# Patient Record
Sex: Female | Born: 1975
Health system: Southern US, Community
[De-identification: ages and names within clinical notes are randomized; demographics above are authoritative.]

## PROBLEM LIST (undated history)

## (undated) DIAGNOSIS — F329 Major depressive disorder, single episode, unspecified: Secondary | ICD-10-CM

## (undated) DIAGNOSIS — G43909 Migraine, unspecified, not intractable, without status migrainosus: Secondary | ICD-10-CM

## (undated) DIAGNOSIS — D649 Anemia, unspecified: Secondary | ICD-10-CM

## (undated) DIAGNOSIS — G4733 Obstructive sleep apnea (adult) (pediatric): Secondary | ICD-10-CM

## (undated) DIAGNOSIS — I5043 Acute on chronic combined systolic (congestive) and diastolic (congestive) heart failure: Secondary | ICD-10-CM

## (undated) DIAGNOSIS — I4891 Unspecified atrial fibrillation: Secondary | ICD-10-CM

## (undated) DIAGNOSIS — R87619 Unspecified abnormal cytological findings in specimens from cervix uteri: Secondary | ICD-10-CM

## (undated) DIAGNOSIS — E282 Polycystic ovarian syndrome: Secondary | ICD-10-CM

## (undated) DIAGNOSIS — I5020 Unspecified systolic (congestive) heart failure: Secondary | ICD-10-CM

## (undated) DIAGNOSIS — IMO0002 Reserved for concepts with insufficient information to code with codable children: Secondary | ICD-10-CM

## (undated) DIAGNOSIS — I509 Heart failure, unspecified: Secondary | ICD-10-CM

## (undated) DIAGNOSIS — I10 Essential (primary) hypertension: Secondary | ICD-10-CM

## (undated) HISTORY — DX: Major depressive disorder, single episode, unspecified: F32.9

## (undated) HISTORY — DX: Heart failure, unspecified: I50.9

## (undated) HISTORY — PX: CERVIX LESION DESTRUCTION: SHX591

---
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
## 1997-11-10 ENCOUNTER — Other Ambulatory Visit: Admission: RE | Admit: 1997-11-10 | Discharge: 1997-11-10 | Payer: Self-pay | Admitting: Family Medicine

## 1998-01-04 ENCOUNTER — Emergency Department (HOSPITAL_COMMUNITY): Admission: EM | Admit: 1998-01-04 | Discharge: 1998-01-04 | Payer: Self-pay | Admitting: Emergency Medicine

## 1998-04-21 ENCOUNTER — Emergency Department (HOSPITAL_COMMUNITY): Admission: EM | Admit: 1998-04-21 | Discharge: 1998-04-21 | Payer: Self-pay | Admitting: Emergency Medicine

## 1998-08-20 ENCOUNTER — Inpatient Hospital Stay (HOSPITAL_COMMUNITY): Admission: AD | Admit: 1998-08-20 | Discharge: 1998-08-20 | Payer: Self-pay | Admitting: Obstetrics & Gynecology

## 1998-08-21 ENCOUNTER — Encounter: Payer: Self-pay | Admitting: Obstetrics & Gynecology

## 1998-08-22 ENCOUNTER — Inpatient Hospital Stay (HOSPITAL_COMMUNITY): Admission: AD | Admit: 1998-08-22 | Discharge: 1998-08-22 | Payer: Self-pay | Admitting: *Deleted

## 1998-10-09 ENCOUNTER — Inpatient Hospital Stay (HOSPITAL_COMMUNITY): Admission: AD | Admit: 1998-10-09 | Discharge: 1998-10-09 | Payer: Self-pay | Admitting: *Deleted

## 1998-11-06 ENCOUNTER — Ambulatory Visit (HOSPITAL_COMMUNITY): Admission: RE | Admit: 1998-11-06 | Discharge: 1998-11-06 | Payer: Self-pay | Admitting: Obstetrics & Gynecology

## 1998-12-27 ENCOUNTER — Inpatient Hospital Stay (HOSPITAL_COMMUNITY): Admission: AD | Admit: 1998-12-27 | Discharge: 1998-12-27 | Payer: Self-pay | Admitting: Obstetrics & Gynecology

## 1999-02-18 ENCOUNTER — Ambulatory Visit (HOSPITAL_COMMUNITY): Admission: RE | Admit: 1999-02-18 | Discharge: 1999-02-18 | Payer: Self-pay | Admitting: *Deleted

## 1999-03-06 ENCOUNTER — Inpatient Hospital Stay (HOSPITAL_COMMUNITY): Admission: AD | Admit: 1999-03-06 | Discharge: 1999-03-06 | Payer: Self-pay | Admitting: Obstetrics

## 1999-03-16 ENCOUNTER — Inpatient Hospital Stay (HOSPITAL_COMMUNITY): Admission: AD | Admit: 1999-03-16 | Discharge: 1999-03-17 | Payer: Self-pay | Admitting: Obstetrics

## 1999-03-21 ENCOUNTER — Inpatient Hospital Stay (HOSPITAL_COMMUNITY): Admission: AD | Admit: 1999-03-21 | Discharge: 1999-03-21 | Payer: Self-pay | Admitting: Obstetrics

## 1999-03-23 ENCOUNTER — Inpatient Hospital Stay (HOSPITAL_COMMUNITY): Admission: AD | Admit: 1999-03-23 | Discharge: 1999-03-23 | Payer: Self-pay | Admitting: *Deleted

## 1999-03-24 ENCOUNTER — Inpatient Hospital Stay (HOSPITAL_COMMUNITY): Admission: AD | Admit: 1999-03-24 | Discharge: 1999-03-26 | Payer: Self-pay | Admitting: Obstetrics & Gynecology

## 1999-08-23 ENCOUNTER — Inpatient Hospital Stay (HOSPITAL_COMMUNITY): Admission: AD | Admit: 1999-08-23 | Discharge: 1999-08-23 | Payer: Self-pay | Admitting: *Deleted

## 1999-09-03 ENCOUNTER — Inpatient Hospital Stay (HOSPITAL_COMMUNITY): Admission: AD | Admit: 1999-09-03 | Discharge: 1999-09-03 | Payer: Self-pay | Admitting: Obstetrics

## 1999-12-09 ENCOUNTER — Emergency Department (HOSPITAL_COMMUNITY): Admission: EM | Admit: 1999-12-09 | Discharge: 1999-12-10 | Payer: Self-pay | Admitting: Emergency Medicine

## 2000-05-10 ENCOUNTER — Emergency Department (HOSPITAL_COMMUNITY): Admission: EM | Admit: 2000-05-10 | Discharge: 2000-05-10 | Payer: Self-pay | Admitting: Emergency Medicine

## 2000-05-21 ENCOUNTER — Emergency Department (HOSPITAL_COMMUNITY): Admission: EM | Admit: 2000-05-21 | Discharge: 2000-05-21 | Payer: Self-pay | Admitting: Emergency Medicine

## 2000-06-06 ENCOUNTER — Inpatient Hospital Stay (HOSPITAL_COMMUNITY): Admission: AD | Admit: 2000-06-06 | Discharge: 2000-06-06 | Payer: Self-pay | Admitting: *Deleted

## 2000-07-21 ENCOUNTER — Inpatient Hospital Stay (HOSPITAL_COMMUNITY): Admission: AD | Admit: 2000-07-21 | Discharge: 2000-07-21 | Payer: Self-pay | Admitting: Obstetrics & Gynecology

## 2000-10-30 ENCOUNTER — Inpatient Hospital Stay (HOSPITAL_COMMUNITY): Admission: AD | Admit: 2000-10-30 | Discharge: 2000-10-30 | Payer: Self-pay | Admitting: Obstetrics & Gynecology

## 2001-04-22 ENCOUNTER — Encounter: Payer: Self-pay | Admitting: Emergency Medicine

## 2001-04-22 ENCOUNTER — Emergency Department (HOSPITAL_COMMUNITY): Admission: EM | Admit: 2001-04-22 | Discharge: 2001-04-22 | Payer: Self-pay | Admitting: Emergency Medicine

## 2001-07-27 ENCOUNTER — Emergency Department (HOSPITAL_COMMUNITY): Admission: EM | Admit: 2001-07-27 | Discharge: 2001-07-27 | Payer: Self-pay | Admitting: Emergency Medicine

## 2001-08-26 ENCOUNTER — Inpatient Hospital Stay (HOSPITAL_COMMUNITY): Admission: AD | Admit: 2001-08-26 | Discharge: 2001-08-26 | Payer: Self-pay | Admitting: *Deleted

## 2001-09-13 ENCOUNTER — Emergency Department (HOSPITAL_COMMUNITY): Admission: EM | Admit: 2001-09-13 | Discharge: 2001-09-13 | Payer: Self-pay

## 2001-12-18 ENCOUNTER — Emergency Department (HOSPITAL_COMMUNITY): Admission: EM | Admit: 2001-12-18 | Discharge: 2001-12-19 | Payer: Self-pay | Admitting: Emergency Medicine

## 2002-02-09 ENCOUNTER — Inpatient Hospital Stay (HOSPITAL_COMMUNITY): Admission: AD | Admit: 2002-02-09 | Discharge: 2002-02-09 | Payer: Self-pay | Admitting: *Deleted

## 2002-06-06 ENCOUNTER — Inpatient Hospital Stay (HOSPITAL_COMMUNITY): Admission: AD | Admit: 2002-06-06 | Discharge: 2002-06-06 | Payer: Self-pay | Admitting: *Deleted

## 2002-08-21 ENCOUNTER — Inpatient Hospital Stay (HOSPITAL_COMMUNITY): Admission: AD | Admit: 2002-08-21 | Discharge: 2002-08-21 | Payer: Self-pay | Admitting: Obstetrics and Gynecology

## 2002-12-20 ENCOUNTER — Emergency Department (HOSPITAL_COMMUNITY): Admission: EM | Admit: 2002-12-20 | Discharge: 2002-12-21 | Payer: Self-pay | Admitting: Emergency Medicine

## 2003-03-22 ENCOUNTER — Emergency Department (HOSPITAL_COMMUNITY): Admission: EM | Admit: 2003-03-22 | Discharge: 2003-03-22 | Payer: Self-pay | Admitting: Emergency Medicine

## 2003-05-15 ENCOUNTER — Emergency Department (HOSPITAL_COMMUNITY): Admission: EM | Admit: 2003-05-15 | Discharge: 2003-05-16 | Payer: Self-pay | Admitting: Emergency Medicine

## 2003-08-06 ENCOUNTER — Inpatient Hospital Stay (HOSPITAL_COMMUNITY): Admission: AD | Admit: 2003-08-06 | Discharge: 2003-08-06 | Payer: Self-pay | Admitting: Obstetrics and Gynecology

## 2004-08-16 ENCOUNTER — Inpatient Hospital Stay (HOSPITAL_COMMUNITY): Admission: AD | Admit: 2004-08-16 | Discharge: 2004-08-16 | Payer: Self-pay | Admitting: Obstetrics & Gynecology

## 2004-10-21 ENCOUNTER — Emergency Department (HOSPITAL_COMMUNITY): Admission: EM | Admit: 2004-10-21 | Discharge: 2004-10-21 | Payer: Self-pay | Admitting: Emergency Medicine

## 2004-11-18 ENCOUNTER — Emergency Department (HOSPITAL_COMMUNITY): Admission: EM | Admit: 2004-11-18 | Discharge: 2004-11-18 | Payer: Self-pay | Admitting: Family Medicine

## 2004-12-29 ENCOUNTER — Inpatient Hospital Stay (HOSPITAL_COMMUNITY): Admission: AD | Admit: 2004-12-29 | Discharge: 2004-12-29 | Payer: Self-pay | Admitting: *Deleted

## 2005-02-01 ENCOUNTER — Emergency Department (HOSPITAL_COMMUNITY): Admission: EM | Admit: 2005-02-01 | Discharge: 2005-02-01 | Payer: Self-pay | Admitting: Emergency Medicine

## 2005-02-06 ENCOUNTER — Emergency Department (HOSPITAL_COMMUNITY): Admission: EM | Admit: 2005-02-06 | Discharge: 2005-02-06 | Payer: Self-pay | Admitting: *Deleted

## 2005-08-01 ENCOUNTER — Emergency Department (HOSPITAL_COMMUNITY): Admission: EM | Admit: 2005-08-01 | Discharge: 2005-08-01 | Payer: Self-pay | Admitting: Emergency Medicine

## 2006-06-02 ENCOUNTER — Emergency Department (HOSPITAL_COMMUNITY): Admission: EM | Admit: 2006-06-02 | Discharge: 2006-06-02 | Payer: Self-pay | Admitting: Family Medicine

## 2006-06-20 ENCOUNTER — Emergency Department (HOSPITAL_COMMUNITY): Admission: EM | Admit: 2006-06-20 | Discharge: 2006-06-20 | Payer: Self-pay | Admitting: Emergency Medicine

## 2006-10-19 ENCOUNTER — Emergency Department (HOSPITAL_COMMUNITY): Admission: EM | Admit: 2006-10-19 | Discharge: 2006-10-19 | Payer: Self-pay | Admitting: Family Medicine

## 2007-03-30 ENCOUNTER — Inpatient Hospital Stay (HOSPITAL_COMMUNITY): Admission: AD | Admit: 2007-03-30 | Discharge: 2007-03-31 | Payer: Self-pay | Admitting: Obstetrics and Gynecology

## 2007-04-09 ENCOUNTER — Other Ambulatory Visit: Payer: Self-pay | Admitting: Emergency Medicine

## 2007-04-09 ENCOUNTER — Emergency Department (HOSPITAL_COMMUNITY): Admission: EM | Admit: 2007-04-09 | Discharge: 2007-04-09 | Payer: Self-pay | Admitting: Emergency Medicine

## 2007-05-03 ENCOUNTER — Ambulatory Visit: Payer: Self-pay | Admitting: Obstetrics and Gynecology

## 2007-06-14 ENCOUNTER — Inpatient Hospital Stay (HOSPITAL_COMMUNITY): Admission: AD | Admit: 2007-06-14 | Discharge: 2007-06-14 | Payer: Self-pay | Admitting: Obstetrics and Gynecology

## 2008-01-16 ENCOUNTER — Inpatient Hospital Stay (HOSPITAL_COMMUNITY): Admission: AD | Admit: 2008-01-16 | Discharge: 2008-01-17 | Payer: Self-pay | Admitting: Obstetrics and Gynecology

## 2008-01-21 ENCOUNTER — Inpatient Hospital Stay (HOSPITAL_COMMUNITY): Admission: AD | Admit: 2008-01-21 | Discharge: 2008-01-22 | Payer: Self-pay | Admitting: Obstetrics & Gynecology

## 2008-02-13 ENCOUNTER — Ambulatory Visit: Payer: Self-pay | Admitting: Obstetrics and Gynecology

## 2008-05-28 ENCOUNTER — Encounter: Payer: Self-pay | Admitting: Obstetrics and Gynecology

## 2008-05-28 ENCOUNTER — Ambulatory Visit: Payer: Self-pay | Admitting: Obstetrics and Gynecology

## 2008-08-06 ENCOUNTER — Ambulatory Visit: Payer: Self-pay | Admitting: Obstetrics & Gynecology

## 2008-08-30 ENCOUNTER — Inpatient Hospital Stay (HOSPITAL_COMMUNITY): Admission: AD | Admit: 2008-08-30 | Discharge: 2008-08-30 | Payer: Self-pay | Admitting: Obstetrics & Gynecology

## 2008-09-25 ENCOUNTER — Ambulatory Visit: Payer: Self-pay | Admitting: Obstetrics and Gynecology

## 2008-09-25 ENCOUNTER — Other Ambulatory Visit: Admission: RE | Admit: 2008-09-25 | Discharge: 2008-09-25 | Payer: Self-pay | Admitting: Family Medicine

## 2008-11-05 ENCOUNTER — Ambulatory Visit: Payer: Self-pay | Admitting: Obstetrics and Gynecology

## 2009-06-02 ENCOUNTER — Inpatient Hospital Stay (HOSPITAL_COMMUNITY): Admission: AD | Admit: 2009-06-02 | Discharge: 2009-06-02 | Payer: Self-pay | Admitting: Obstetrics & Gynecology

## 2009-08-04 ENCOUNTER — Emergency Department (HOSPITAL_COMMUNITY): Admission: EM | Admit: 2009-08-04 | Discharge: 2009-08-04 | Payer: Self-pay | Admitting: Emergency Medicine

## 2009-08-21 ENCOUNTER — Observation Stay (HOSPITAL_COMMUNITY): Admission: AD | Admit: 2009-08-21 | Discharge: 2009-08-22 | Payer: Self-pay | Admitting: Obstetrics & Gynecology

## 2009-08-21 ENCOUNTER — Ambulatory Visit: Payer: Self-pay | Admitting: Obstetrics & Gynecology

## 2009-08-21 DIAGNOSIS — N7011 Chronic salpingitis: Secondary | ICD-10-CM | POA: Insufficient documentation

## 2009-08-21 HISTORY — DX: Chronic salpingitis: N70.11

## 2009-09-18 ENCOUNTER — Encounter: Payer: Self-pay | Admitting: Obstetrics & Gynecology

## 2009-09-18 ENCOUNTER — Ambulatory Visit: Payer: Self-pay | Admitting: Obstetrics and Gynecology

## 2009-10-16 ENCOUNTER — Ambulatory Visit: Payer: Self-pay | Admitting: Obstetrics & Gynecology

## 2009-11-06 ENCOUNTER — Ambulatory Visit: Payer: Self-pay | Admitting: Obstetrics & Gynecology

## 2009-12-01 ENCOUNTER — Inpatient Hospital Stay (HOSPITAL_COMMUNITY): Admission: AD | Admit: 2009-12-01 | Discharge: 2009-12-01 | Payer: Self-pay | Admitting: Obstetrics & Gynecology

## 2009-12-04 ENCOUNTER — Ambulatory Visit: Payer: Self-pay | Admitting: Obstetrics and Gynecology

## 2010-10-09 LAB — POCT I-STAT, CHEM 8
Glucose, Bld: 124 mg/dL — ABNORMAL HIGH (ref 70–99)
HCT: 31 % — ABNORMAL LOW (ref 36.0–46.0)
Hemoglobin: 10.5 g/dL — ABNORMAL LOW (ref 12.0–15.0)
Potassium: 4 mEq/L (ref 3.5–5.1)
TCO2: 27 mmol/L (ref 0–100)

## 2010-10-09 LAB — DIFFERENTIAL
Basophils Relative: 1 % (ref 0–1)
Eosinophils Absolute: 0.1 10*3/uL (ref 0.0–0.7)
Eosinophils Relative: 1 % (ref 0–5)
Monocytes Relative: 6 % (ref 3–12)
Neutrophils Relative %: 62 % (ref 43–77)

## 2010-10-09 LAB — WET PREP, GENITAL: Yeast Wet Prep HPF POC: NONE SEEN

## 2010-10-09 LAB — CBC
MCHC: 31.4 g/dL (ref 30.0–36.0)
MCV: 74 fL — ABNORMAL LOW (ref 78.0–100.0)
Platelets: 345 10*3/uL (ref 150–400)
RBC: 4.11 MIL/uL (ref 3.87–5.11)

## 2010-10-09 LAB — PREGNANCY, URINE: Preg Test, Ur: NEGATIVE

## 2010-10-11 LAB — CBC
Hemoglobin: 6.5 g/dL — CL (ref 12.0–15.0)
Hemoglobin: 8.4 g/dL — ABNORMAL LOW (ref 12.0–15.0)
MCHC: 32.8 g/dL (ref 30.0–36.0)
MCV: 76.9 fL — ABNORMAL LOW (ref 78.0–100.0)
Platelets: 460 10*3/uL — ABNORMAL HIGH (ref 150–400)
RBC: 3.33 MIL/uL — ABNORMAL LOW (ref 3.87–5.11)
RDW: 17.7 % — ABNORMAL HIGH (ref 11.5–15.5)
RDW: 19.3 % — ABNORMAL HIGH (ref 11.5–15.5)

## 2010-10-11 LAB — URINALYSIS, ROUTINE W REFLEX MICROSCOPIC
Leukocytes, UA: NEGATIVE
Nitrite: NEGATIVE
Protein, ur: NEGATIVE mg/dL
Specific Gravity, Urine: >1.03 — ABNORMAL HIGH (ref 1.005–1.030)
Urobilinogen, UA: 0.2 mg/dL (ref 0.0–1.0)

## 2010-10-11 LAB — COMPREHENSIVE METABOLIC PANEL
ALT: 8 U/L (ref 0–35)
AST: 15 U/L (ref 0–37)
Albumin: 3.3 g/dL — ABNORMAL LOW (ref 3.5–5.2)
CO2: 26 mEq/L (ref 19–32)
Chloride: 105 mEq/L (ref 96–112)
GFR calc Af Amer: >60 mL/min (ref >60)
GFR calc non Af Amer: >60 mL/min (ref >60)
Sodium: 137 mEq/L (ref 135–145)
Total Bilirubin: 0.6 mg/dL (ref 0.3–1.2)

## 2010-10-11 LAB — URINE MICROSCOPIC-ADD ON

## 2010-10-11 LAB — CROSSMATCH: Antibody Screen: NEGATIVE

## 2010-10-12 LAB — CBC
MCHC: 31.8 g/dL (ref 30.0–36.0)
MCV: 68.5 fL — ABNORMAL LOW (ref 78.0–100.0)
Platelets: 373 10*3/uL (ref 150–400)
RBC: 4.5 MIL/uL (ref 3.87–5.11)

## 2010-10-12 LAB — SAMPLE TO BLOOD BANK

## 2010-10-15 LAB — POCT PREGNANCY, URINE: Preg Test, Ur: NEGATIVE

## 2010-11-09 LAB — CBC
HCT: 31.5 % — ABNORMAL LOW (ref 36.0–46.0)
Hemoglobin: 10 g/dL — ABNORMAL LOW (ref 12.0–15.0)
MCHC: 31.7 g/dL (ref 30.0–36.0)
MCV: 71.9 fL — ABNORMAL LOW (ref 78.0–100.0)
RBC: 4.38 MIL/uL (ref 3.87–5.11)
WBC: 8.8 10*3/uL (ref 4.0–10.5)

## 2010-11-09 LAB — WET PREP, GENITAL: Yeast Wet Prep HPF POC: NONE SEEN

## 2010-11-09 LAB — GC/CHLAMYDIA PROBE AMP, GENITAL: Chlamydia, DNA Probe: NEGATIVE

## 2010-11-12 ENCOUNTER — Ambulatory Visit (INDEPENDENT_AMBULATORY_CARE_PROVIDER_SITE_OTHER): Payer: Medicaid Other | Admitting: Advanced Practice Midwife

## 2010-11-12 DIAGNOSIS — N920 Excessive and frequent menstruation with regular cycle: Secondary | ICD-10-CM

## 2010-11-12 DIAGNOSIS — E282 Polycystic ovarian syndrome: Secondary | ICD-10-CM

## 2010-11-13 NOTE — Group Therapy Note (Unsigned)
NAMEMarland Kitchen  [name], [name] NO.:  [id number]  MEDICAL RECORD NO.:  [id number]           PATIENT TYPE:  A  [facility]ATION:  [facility]                   FACILITY:  [facility]  PHYSICIAN:  [name], CNM    DATE OF BIRTH:  [birth year]  DATE OF SERVICE:  11/12/2010                                 CLINIC NOTE  This is a 35 year old gravida 2, para 2 who is not pregnant who presents with report of continued menorrhagia and is requesting to be restarted on oral contraceptives.  She has seen Dr. Jolayne [name], Dr. Marice [name] and Dr. Okey [name] for several years here in the clinic for the same dysfunctional uterine bleeding associated with menorrhagia and PCOS.  She has been trying to conceive in recent years and has been through several cycles of Clomid and also had an IUD placed in Tammy 2011 which was expelled a month later.  She has used birth control pills intermittently along with Provera and Clomid.  She says that she has been counseled on endometrial ablation but does not want to proceed with that at this time and wants to simply go back on oral contraceptives as she had the best control with them.  She is off of pills.  She has about 3-4 periods a year and on pills she has about 6 periods a year, although she states she is not worried about conceiving anymore.  She does want to get the phone number for Dr. April [name] which she lost after her last visit.  She is having some heavy bleeding today and declines Pap smear and pelvic exam, but she will need a Pap smear on her next visit if the bleeding is subsided. She is also requesting a prescription for some ibuprofen for her dysmenorrhea.  Blood pressure is noted to be 136/93 today, weight is 210.  She has had multiple instances of hypertension over the past previous visits in the 120-140s range with diastolics in the 90s.  She has never had her blood pressure evaluated or treated.  She states that she has a child who is sick and getting ready to  go to [facility] for Crohn disease and other medical problems and that she does not have time to go to a primary doctor.  She is interested in starting some medication for her blood pressure.  ASSESSMENT AND PLAN: 1. Polycystic ovarian syndrome. 2. Dysfunctional uterine bleeding/menorrhagia. 3. Anemia. 4. Obesity.  PLAN: 1. Prescription was given for Ovcon 35 one p.o. daily with 5 refills. 2. Discussed the patient with Dr. Shawnie [name] who agrees with plans for OCPs     and also consents to start the patient on HCTZ 12.5 mg p.o. daily     as initial therapy for mild hypertension.  We will recheck her     blood pressure in a month and evaluate effectiveness of her     medication at that time.  We may need to go up to 25 mg of HCTZ.     Of note, she does report being a nonsmoker. 3. Prescription given for ibuprofen 600 mg p.o. q.6 h. p.r.n. pain. 4. Return in 4 weeks for blood pressure  check, evaluation of bleeding     and Pap smear at that time.  Her last Pap was noted to be in 2009.          ______________________________ [name], CNM    MW/MEDQ  D:  11/12/2010  T:  11/13/2010  Job:  [id number]

## 2010-12-07 NOTE — Group Therapy Note (Signed)
NAME:  [name], [name] NO.:  [id number]   MEDICAL RECORD NO.:  [id number]          PATIENT TYPE:  WOC   LOCATION:  [facility]                   FACILITY:  [facility]   PHYSICIAN:  Argentina Donovan, MD        DATE OF BIRTH:  1975-12-17   DATE OF SERVICE:  02/13/2008                                  CLINIC NOTE   The patient is a 35 year old gravida 2, para 2-0-0-2 with children ages  is 7 and 50 who developed irregular periods 4 years ago.  Sometimes goes  3-4 months between periods.  She has also developed hirsutism.  Her  ultrasound was normal but she has a high free testosterone of 15.8 and  has been considered a polycystic ovarian syndrome patient.  She was  given instructions to use Provera.  She did not have a period  periodically some time ago.  Unfortunately, she ran out of that, was in  the emergency room recently after a 3-month amenorrheic phase and she  was having extraordinarily heavy bleeding with a hemoglobin of 9.6.  They put her on Provera and that stopped the bleeding.  She ran out of  her last pill yesterday, began bleeding today.  I told her this was  normal.  She desires pregnancy.  We told her we could control her  bleeding with several methods and she did not want to not try and get  pregnant.  On the other hand, she has a little difficult time paying for  Clomid at this point.  I have educated her about the use of a basal  temperature thermometer and basal temperature chart.  We have talked  about what she can do.  I am going to put her on 500 mg of Glucophage  b.i.d. with meals.  I instructed not to take it if she does not have a  meal, and Provera to take every 45 days if she does not have a period.  I am going to reevaluate her in 3 months.  If she is ovulating, will  continue the therapy.  If she is not, will start her on Clomid in  addition to this and if she ovulates regularly and is not pregnant at  that time, will get a sperm count because her  partner has no children.  At this point she weighs 208 pounds and is 4 feet 9 inches tall.  A  lovely young lady and I think we can help her.  She is intelligent and  she seems to understand what we are discussing.   IMPRESSION:  Polycystic ovarian syndrome with recent dysfunctional  uterine bleeding and anemia.   PLAN:  Treat to promote ovulation.           ______________________________  Argentina Donovan, MD     PR/MEDQ  D:  02/13/2008  T:  02/13/2008  Job:  [id number]

## 2010-12-07 NOTE — Group Therapy Note (Signed)
NAME:  [name], [name] NO.:  [id number]   MEDICAL RECORD NO.:  [id number]          PATIENT TYPE:  WOC   [facility]ATION[facility]                   [facility]ACILITY[facility]  [facility]   PHYSICIAN:  Argentina Donovan, MD        DATE OF BIRTH:  [birth year]   DATE OF SERVICE:  11/05/2008                                  CLINIC NOTE   The patient is a 35 year old African American female with known  polycystic ovarian syndrome.  We started her on Clomid.  She did a  temperature chart last month; however, was unable to put it on a chart,  but she brought in all her temperatures and she went above 98 on only 2  days of that time.  She has not had a period since that time since  starting the Provera to bring it on that we started and she has stopped  the Glucophage because of chronic nausea.  We have given her Provera  again to bring on an artificial period.  We will put her on 100 mg of  Clomid for 5 days with instructions if she has a period to come in to  drop off her temperature chart or if she does not have one 30 days after  taking the Clomid to drop off her temperature chart or to come in and we  will up the dose again to three a day.   IMPRESSION:  Amenorrhea with polycystic ovarian syndrome attempting  pregnancy.           ______________________________  Argentina Donovan, MD     PR/MEDQ  D:  11/05/2008  T:  11/05/2008  Job:  [id number]

## 2010-12-07 NOTE — Group Therapy Note (Signed)
NAME:  [name], [name] NO.:  [id number]   MEDICAL RECORD NO.:  [id number]          PATIENT TYPE:  WOC   [facility]ATION[facility]                   [facility]   PHYSICIAN:  Argentina Donovan, MD        DATE OF BIRTH:  Feb 22, 1976   DATE OF SERVICE:  09/25/2008                                  CLINIC NOTE   The patient is a 35 year old African American female known to [place] with  polycystic ovarian syndrome who was attempting to get pregnant and was  on previously on 500 of Glucophage b.i.d. Some time ago she stopped  taking her temperature and stopped the Glucophage because she was having  some irregular bleeding.  I had not seen her back.  She was supposed to  be also taking basal body temperatures but she ended up in the maternity  admissions with heavy vaginal bleeding a few days ago and her hemoglobin  had dropped significantly from the last time that she was in.  She had  not been on any iron replacement therapy.  At that time we started her  on that because of the heavy bleeding that she had been having although  today she was bleeding very little and because of the length of time  since she had been taking her Glucophage and thought that it would be  the best thing to do an endometrial biopsy which was done without  incident.  Her uterus sounded to 8-cm.  The patient is 4 foot, 9 and  weighs 205 pounds and her blood pressure is borderline at 139/98.  She  is also on some albuterol intermittently for asthma and other than that  there is not much change to her history.  She is promising me that this  time she will listen to instructions, although I have my doubts and we  will restart her on a temperature chart which is the only inexpensive  way of determining the possibility of whether or not she has ovulated.  I am going to give her start on some Provera for 10 days at 10 mg a day  and with withdrawals we will start her on Clomid 50 mg for 5 days on 5  through 9.  She  will come in if she has not had a period within 30 days  after the Clomid or if she does have a period she will come in within  the first week after the onset of the period.  At that time we will  determine whether or not we have to increase the Clomid.  She has not  been much in favor of taking the Glucophage or getting back on it,  although I think that with this particular patient it be of decided  benefit to help her control the bleeding and also I believe the Clomid  has a better record of getting her pregnant and keeping her pregnant.  There is a higher chance of miscarriage if she conceives on Clomid than  on Glucophage.  At previous visits her free testosterone was  significantly elevated to 15.8 and her sex hormone binding globulin  was  at 12, markedly hirsute, has oligomenorrhea with episodes of  dysfunctional uterine bleeding and has many small follicles within the  ovary seen, so meets all the criteria of polycystic ovarian syndrome.  She is restarting on the temperature chart.  She said her 71-year-old ate  the previous thermometer, so she has to go out and buy a new one.  I am  not sure on how much confidence we can in her following through, but we  will see and try and work with her.  I have stressed the fact that if  she is not attempting pregnancy that we should get her on oral  contraceptives, Depo-Provera or something to inhibit the estrogen  production           ______________________________  Argentina Donovan, MD     PR/MEDQ  D:  09/25/2008  T:  09/25/2008  Job:  [id number]

## 2010-12-07 NOTE — Group Therapy Note (Signed)
NAME:  NEOLA, WORRALL NO.:  [id number]   MEDICAL RECORD NO.:  [id number]          PATIENT TYPE:  WOC   LOCATION[facility]                   [facility]ILITY[facility]  [facility]   PHYSICIAN:  Allie Bossier, MD        DATE OF BIRTH:  03-28-1976   DATE OF SERVICE:  08/06/2008                                  CLINIC NOTE   Cari is a 35 year old African American G2 P2 who was seen here in  November 2009 by Dr. Okey Dupre regarding her desire to get pregnant.  Prior  to that visit she had been instructed to keep an ovulation chart and she  has been on Glucophage 500 mg b.i.d.  When she saw Dr. Okey Dupre on May 28, 2008 she did not bring her temperature chart.  She told Dr. Okey Dupre that  she would fax it to him and that he could judge.  However this was not  done.  In addition since then  she said she kept her temperature chart for the next month, but did not  bring the results with her today and she said the last 3 weeks she has  not taken her temperature at all.  She has had some occasion of spotting  and is wondering if this is a period.  She did say she bled for  approximately 2 days and had to use a panty liner.  She has not taken  the Provera anytime recently.  She has requested a pregnancy test.  I  explained that without any information to go on (there are no  temperature charts for me to look at) that I could not make an informed  decision on whether to add Clomid to her regimen.  I suggested that she  restart taking her temperature as well as stressed the importance that  it is important to bring the temperature charts to her visit should she  like a physician to make an opinion on the said temperature charts.  She  will follow up in 3 months or sooner as necessary.      Allie Bossier, MD     MCD/MEDQ  D:  08/06/2008  T:  08/06/2008  Job:  [id number]

## 2010-12-07 NOTE — Group Therapy Note (Signed)
NAME:  LASHAWNE, DURA NO.:  [id number]   MEDICAL RECORD NO.:  [id number]          PATIENT TYPE:  WOC   [facility]ATION[facility]                   [facility]ILITY[facility]   PHYSICIAN:  Argentina Donovan, MD        DATE OF BIRTH:  [birth year]   DATE OF SERVICE:                                  CLINIC NOTE   HISTORY:  The patient is a 35 year old African American female gravida  2, para 2-0-0-2 who had been trying to get pregnant.  Please see  previous note.  She has been on temperature chart and on Glucophage.  Has had some spontaneous period on her own since then, but she forgot to  bring the temperature charts today.  She is going to fax it over to me.  In addition to that, she needed a Pap smear done.  This was done without  incident.   The external genitalia is normal.  BUS is within normal limits.  Vagina  is clean and well rugated with redundant walls.  The cervix is clean.  The vagina is somewhat elongated, but the introitus is relatively small.  So a Peterson speculum was used.  The uterus and adnexa could not be  outlined.   The patient is told I would give her a call and decide whether or not  she needed Clomid as well as she is trying to conceive at this point.  We have tried to just get her ovulating on the Glucophage in view of the  expense of the Clomid.  Once I get the temperature chart, we will make a  decision and contact her by phone.           ______________________________  Argentina Donovan, MD     PR/MEDQ  D:  05/28/2008  T:  05/28/2008  Job:  [id number]

## 2010-12-07 NOTE — Group Therapy Note (Signed)
NAME:  [name], [name] NO.:  [id number]   MEDICAL RECORD NO.:  [id number]          PATIENT TYPE:  WOC   [facility]                   [facility]   PHYSICIAN:  Argentina Donovan, MD        DATE OF BIRTH:  07/24/1976   DATE OF SERVICE:  05/03/2007                                  CLINIC NOTE   The patient is a 36 year old gravida 2, para 2-0-0-2 with a child age 86  and 30 years old who developed irregular periods about 3 years ago and  sometimes goes 4 months between periods. Has also become quite hirsute  in that time and was seen a short-time ago because of very heavy vaginal  bleeding, was in here twice and in to the emergency room.  Her last  hemoglobin was 8.9 with a hematocrit of  27.5.  She was given iron  pills, but says she has not taken any. I have encouraged her to take  that. We also gave her lessons in how her cycle should work and I have  put her on Provera 10 mg b.i.d. if she goes more than 35 days without.  Also, since she has been trying for 2 years to get pregnant, I have  started her on some Clomid 100 mg for day 5-9 of each cycle when she  does have a period.   IMPRESSION:  Amenorrhea with dysfunctional uterine bleeding periodically  and significant anemia with polycystic ovarian syndrome.           ______________________________  Argentina Donovan, MD     PR/MEDQ  D:  05/03/2007  T:  05/03/2007  Job:  [id number]

## 2010-12-10 ENCOUNTER — Ambulatory Visit: Payer: Medicaid Other | Admitting: Obstetrics and Gynecology

## 2011-04-21 LAB — CBC
HCT: 31.1 — ABNORMAL LOW
Hemoglobin: 9.6 — ABNORMAL LOW
MCHC: 32.5
MCV: 75.5 — ABNORMAL LOW
MCV: 75.7 — ABNORMAL LOW
Platelets: 347
RBC: 3.9
RDW: 17.7 — ABNORMAL HIGH

## 2011-05-02 ENCOUNTER — Emergency Department (HOSPITAL_COMMUNITY)
Admission: EM | Admit: 2011-05-02 | Discharge: 2011-05-02 | Discharge status: Against Medical Advice | Payer: Medicaid Other | Source: Home / Self Care | Attending: Emergency Medicine | Admitting: Emergency Medicine

## 2011-05-03 ENCOUNTER — Encounter (HOSPITAL_COMMUNITY): Payer: Self-pay | Admitting: Radiology

## 2011-05-03 ENCOUNTER — Emergency Department (HOSPITAL_COMMUNITY): Payer: Medicaid Other

## 2011-05-03 ENCOUNTER — Encounter: Payer: Self-pay | Admitting: Family Medicine

## 2011-05-03 ENCOUNTER — Inpatient Hospital Stay (HOSPITAL_COMMUNITY)
Admission: EM | Admit: 2011-05-03 | Discharge: 2011-05-06 | DRG: 872 | Disposition: A | Discharge status: Home / Self Care | Payer: Medicaid Other | Attending: Family Medicine | Admitting: Family Medicine

## 2011-05-03 DIAGNOSIS — D509 Iron deficiency anemia, unspecified: Secondary | ICD-10-CM | POA: Diagnosis present

## 2011-05-03 DIAGNOSIS — R109 Unspecified abdominal pain: Secondary | ICD-10-CM

## 2011-05-03 DIAGNOSIS — J45909 Unspecified asthma, uncomplicated: Secondary | ICD-10-CM | POA: Diagnosis present

## 2011-05-03 DIAGNOSIS — N10 Acute tubulo-interstitial nephritis: Secondary | ICD-10-CM | POA: Diagnosis present

## 2011-05-03 DIAGNOSIS — I10 Essential (primary) hypertension: Secondary | ICD-10-CM | POA: Diagnosis present

## 2011-05-03 DIAGNOSIS — A419 Sepsis, unspecified organism: Principal | ICD-10-CM | POA: Diagnosis present

## 2011-05-03 DIAGNOSIS — G43909 Migraine, unspecified, not intractable, without status migrainosus: Secondary | ICD-10-CM | POA: Diagnosis present

## 2011-05-03 DIAGNOSIS — E282 Polycystic ovarian syndrome: Secondary | ICD-10-CM | POA: Diagnosis present

## 2011-05-03 DIAGNOSIS — E119 Type 2 diabetes mellitus without complications: Secondary | ICD-10-CM | POA: Diagnosis present

## 2011-05-03 HISTORY — DX: Migraine, unspecified, not intractable, without status migrainosus: G43.909

## 2011-05-03 HISTORY — DX: Essential (primary) hypertension: I10

## 2011-05-03 HISTORY — DX: Anemia, unspecified: D64.9

## 2011-05-03 HISTORY — DX: Polycystic ovarian syndrome: E28.2

## 2011-05-03 LAB — URINALYSIS, ROUTINE W REFLEX MICROSCOPIC
Bilirubin Urine: NEGATIVE
Glucose, UA: NEGATIVE mg/dL
Ketones, ur: NEGATIVE mg/dL
Nitrite: NEGATIVE
Protein, ur: 30 mg/dL — AB
Specific Gravity, Urine: 1.02
pH: 7

## 2011-05-03 LAB — POCT I-STAT, CHEM 8
Creatinine, Ser: 0.9 mg/dL (ref 0.50–1.10)
Hemoglobin: 12.6 g/dL (ref 12.0–15.0)
Sodium: 139 mEq/L (ref 135–145)
TCO2: 23 mmol/L (ref 0–100)

## 2011-05-03 LAB — WET PREP, GENITAL
Clue Cells Wet Prep HPF POC: NONE SEEN
Trich, Wet Prep: NONE SEEN

## 2011-05-03 LAB — CBC
Hemoglobin: 10.8 g/dL — ABNORMAL LOW (ref 12.0–15.0)
MCH: 22.7 pg — ABNORMAL LOW (ref 26.0–34.0)
MCHC: 32.1 g/dL (ref 30.0–36.0)
Platelets: 372
RDW: 16.6 — ABNORMAL HIGH
WBC: 12.5 — ABNORMAL HIGH

## 2011-05-03 LAB — URINE MICROSCOPIC-ADD ON

## 2011-05-03 LAB — DIFFERENTIAL
Basophils Relative: 0 % (ref 0–1)
Eosinophils Absolute: 0 10*3/uL (ref 0.0–0.7)
Monocytes Absolute: 0.9 10*3/uL (ref 0.1–1.0)
Monocytes Relative: 6 % (ref 3–12)
Neutro Abs: 12.2 10*3/uL — ABNORMAL HIGH (ref 1.7–7.7)

## 2011-05-03 LAB — POCT PREGNANCY, URINE
Operator id: 23932
Preg Test, Ur: NEGATIVE

## 2011-05-03 LAB — HCG, QUANTITATIVE, PREGNANCY: hCG, Beta Chain, Quant, S: <2

## 2011-05-03 NOTE — H&P (Signed)
Family Medicine Teaching [facility] Admission History and Physical  Patient name: [name] Medical record number: [id number] Date of birth: 11/14/1975 Age: 35 y.o. Gender: female  Primary Care Provider: Unassigned  Chief Complaint: abd pain, back pain, fever, urinary frequency  History of Present Illness: [name] is a 35 y.o. year old female presenting with 6 days of lower back pain and suprapubic pain.  Also endorses, + urinary frequency, + urinary retention, + fever, + chills, N/V x 2 days, Went to ER at [facility] yesterday for these symptoms,but left before being seen.  Pt states that she felt worse this morning- began to have + dizziness, worsening symptoms as per above, so came to the ER for eval.    ROS:as per above. occasional cp "with asthma flares". No SOB.  Occasional lower ext edema.  No rash.  No syncope. No changes in vision.  No changes in bowel.      Past Medical History: Past Medical History  Diagnosis Date  . Asthma   . Hypertension   . Migraines   . Polycystic ovarian disease   . Diabetes mellitus   . Anemia     Past Surgical History: C-section  Social History: Has boyfriend, lives with children, no etoh, no tobacco, no drugs.   Family History: DM, HTN  Allergies: No Known Allergies  Medications: Albuterol prn   Physical Exam: Pulse: 104  Blood Pressure: 127/85 RR: 18   O2: 96% on RA   Temp: 97.9   General: alert and cooperative HEENT: PERRLA and extra ocular movement intact Heart: S1, S2 normal, no murmur, rub or gallop, regular rate and rhythm Lungs: clear to auscultation, no wheezes or rales and unlabored breathing Back- + CVA tenderness bilateral, no tenderness along spine.  Abdomen: moderate tenderness in the suprapubic area. No rebound, no guarding.  Extremities: extremities normal, atraumatic, no cyanosis or edema Skin:no rashes Neurology: normal without focal findings, mental status, speech normal, alert and oriented x3 and  PERLA  Labs and Imaging:  Results for orders placed during the hospital encounter of 05/03/11 (from the past 24 hour(s))  URINALYSIS, ROUTINE W REFLEX MICROSCOPIC     Status: Abnormal   Collection Time   05/03/11  9:54 AM      Component Value Range   Color, Urine AMBER (*) YELLOW    Appearance CLOUDY (*) CLEAR    Specific Gravity, Urine 1.022  1.005 - 1.030    pH 8.0  5.0 - 8.0    Glucose, UA NEGATIVE  NEGATIVE (mg/dL)   Hgb urine dipstick LARGE (*) NEGATIVE    Bilirubin Urine NEGATIVE  NEGATIVE    Ketones, ur NEGATIVE  NEGATIVE (mg/dL)   Protein, ur 30 (*) NEGATIVE (mg/dL)   Urobilinogen, UA 1.0  0.0 - 1.0 (mg/dL)   Nitrite NEGATIVE  NEGATIVE    Leukocytes, UA SMALL (*) NEGATIVE   URINE MICROSCOPIC-ADD ON     Status: Abnormal   Collection Time   05/03/11  9:54 AM      Component Value Range   Squamous Epithelial / LPF FEW (*) RARE    WBC, UA 3-6  <3 (WBC/hpf)   RBC / HPF TOO NUMEROUS TO COUNT  <3 (RBC/hpf)   Bacteria, UA MANY (*) RARE   DIFFERENTIAL     Status: Abnormal   Collection Time   05/03/11 10:17 AM      Component Value Range   Neutrophils Relative 78 (*) 43 - 77 (%)   Neutro Abs 12.2 (*)  1.7 - 7.7 (K/uL)   Lymphocytes Relative 16  12 - 46 (%)   Lymphs Abs 2.6  0.7 - 4.0 (K/uL)   Monocytes Relative 6  3 - 12 (%)   Monocytes Absolute 0.9  0.1 - 1.0 (K/uL)   Eosinophils Relative 0  0 - 5 (%)   Eosinophils Absolute 0.0  0.0 - 0.7 (K/uL)   Basophils Relative 0  0 - 1 (%)   Basophils Absolute 0.0  0.0 - 0.1 (K/uL)  CBC     Status: Abnormal   Collection Time   05/03/11 10:17 AM      Component Value Range   WBC 15.7 (*) 4.0 - 10.5 (K/uL)   RBC 4.76  3.87 - 5.11 (MIL/uL)   Hemoglobin 10.8 (*) 12.0 - 15.0 (g/dL)   HCT 78.2 (*) 95.6 - 46.0 (%)   MCV 70.6 (*) 78.0 - 100.0 (fL)   MCH 22.7 (*) 26.0 - 34.0 (pg)   MCHC 32.1  30.0 - 36.0 (g/dL)   RDW 21.3 (*) 08.6 - 15.5 (%)   Platelets 363  150 - 400 (K/uL)  POCT I-STAT, CHEM 8     Status: Abnormal   Collection Time    05/03/11 10:27 AM      Component Value Range   Sodium 139  135 - 145 (mEq/L)   Potassium 3.9  3.5 - 5.1 (mEq/L)   Chloride 105  96 - 112 (mEq/L)   BUN 8  6 - 23 (mg/dL)   Creatinine, Ser 5.78  0.50 - 1.10 (mg/dL)   Glucose, Bld 469 (*) 70 - 99 (mg/dL)   Calcium, Ion 6.29  5.28 - 1.32 (mmol/L)   TCO2 23  0 - 100 (mmol/L)   Hemoglobin 12.6  12.0 - 15.0 (g/dL)   HCT 41.3  24.4 - 01.0 (%)  POCT PREGNANCY, URINE     Status: Normal   Collection Time   05/03/11 10:40 AM      Component Value Range   Preg Test, Ur NEGATIVE     CT of abd and pelvis: Slight swelling of the left kidney.  Peri nephric stranding.   Findings are most consistent with pyelonephritis.  Differential   diagnosis also includes mild residual swelling related to pass   renal stone.  No visible remaining urinary tract stone.  Assessment and Plan: [name] is a 35 y.o. year old female presenting with suprapubic pain, CVA tenderness, + fever, elevated wbc count, tachycardia and + uti.   1. Pyelonephritis: U/A + for UTI. + fever, elevated wbc count, + CVA tenderness, + suprapubic pain.  + CT scan showing perinephric stranding.  All these findings support dx of phyelonephritis. Pt given bactrim and cipro in ER.  Per up to date rec's- will give 1 dose of ceftriaxone since culture and sensitivities still pending- will continue with cipro po bid.  Called hopsital pharmacist to ask if cipro supported by [facility] biogram.  He agrees that cipro is still the recommended agent for treatment of pyelo.  Pt has received 3l of ns while in ER.  HR now improved to 104.  Temp now resolved.  Will monitor on Telemetry for now.  2. Nausea/Vomiting: 2/2 pyelonephritis. Will treat with zofran.  If needed can also give phenergan.   3. Back pain/suprapubic pain: Will treat with toradol q 6 hours as needed for pain.  When able to tolerate po's will transition to po pain medication.  Pain should improve over the next 24 hours with treatment  of pyelo.  4.asthma- Will continue albuterol prn as per pt home med regimen.   5.Tachycardia- Now improved, most likely 2/2 pyelo and fever, HR now 104 s/p 3 L NS. Will monitor closely on telemetery.  6. Anemia- Hgb 10- this is per pt baseline.  To be f/up as an outpatient.  No action/treatment needed at this time except for possible iron supplementation.   7. FEN/GI: Clear liquids- will advance as tolerated  8. Prophylaxis: heparin 5000units SQ TID  9. Disposition: pending clinical improvement.  dicatation [phone]

## 2011-05-04 LAB — COMPREHENSIVE METABOLIC PANEL
ALT: 7 U/L (ref 0–35)
Calcium: 8.9 mg/dL (ref 8.4–10.5)
Creatinine, Ser: 0.72 mg/dL (ref 0.50–1.10)
GFR calc Af Amer: >90 mL/min (ref >90)
GFR calc non Af Amer: >90 mL/min (ref >90)
Glucose, Bld: 102 mg/dL — ABNORMAL HIGH (ref 70–99)
Sodium: 134 mEq/L — ABNORMAL LOW (ref 135–145)
Total Protein: 7.8 g/dL (ref 6.0–8.3)

## 2011-05-04 LAB — URINE CULTURE
Colony Count: 90000
Culture  Setup Time: 201210091103

## 2011-05-04 LAB — CBC
MCH: 22.2 pg — ABNORMAL LOW (ref 26.0–34.0)
MCHC: 31.5 g/dL (ref 30.0–36.0)
MCV: 70.3 fL — ABNORMAL LOW (ref 78.0–100.0)
Platelets: 349 10*3/uL (ref 150–400)

## 2011-05-05 LAB — POCT PREGNANCY, URINE
Operator id: 29026
Preg Test, Ur: NEGATIVE

## 2011-05-05 LAB — BASIC METABOLIC PANEL
BUN: 6 mg/dL (ref 6–23)
CO2: 25 mEq/L (ref 19–32)
Calcium: 9 mg/dL (ref 8.4–10.5)
Chloride: 104 mEq/L (ref 96–112)
Creatinine, Ser: 0.56 mg/dL (ref 0.50–1.10)
Glucose, Bld: 101 mg/dL — ABNORMAL HIGH (ref 70–99)

## 2011-05-05 LAB — RETICULOCYTES: Retic Count, Absolute: 33.2 10*3/uL (ref 19.0–186.0)

## 2011-05-05 LAB — CBC
HCT: 29.1 % — ABNORMAL LOW (ref 36.0–46.0)
HCT: 27.5 — ABNORMAL LOW
MCH: 22.7 pg — ABNORMAL LOW (ref 26.0–34.0)
MCHC: 32.3 g/dL (ref 30.0–36.0)
MCV: 70.1 fL — ABNORMAL LOW (ref 78.0–100.0)
MCV: 77.7 — ABNORMAL LOW
Platelets: 333 10*3/uL (ref 150–400)
Platelets: 362
RDW: 18 % — ABNORMAL HIGH (ref 11.5–15.5)
RDW: 16.2 — ABNORMAL HIGH
WBC: 10.2 10*3/uL (ref 4.0–10.5)

## 2011-05-05 LAB — IRON AND TIBC: UIBC: 256 ug/dL (ref 125–400)

## 2011-05-05 LAB — FERRITIN: Ferritin: 90 ng/mL (ref 10–291)

## 2011-05-05 LAB — VITAMIN B12: Vitamin B-12: 510 pg/mL (ref 211–911)

## 2011-05-05 LAB — DIFFERENTIAL
Basophils Absolute: 0
Eosinophils Absolute: 0.1
Eosinophils Relative: 1

## 2011-05-06 LAB — HCG, SERUM, QUALITATIVE: Preg, Serum: NEGATIVE

## 2011-05-06 LAB — URINALYSIS, ROUTINE W REFLEX MICROSCOPIC
Bilirubin Urine: NEGATIVE
Protein, ur: NEGATIVE
Urobilinogen, UA: 1

## 2011-05-06 LAB — POCT PREGNANCY, URINE
Operator id: 114931
Preg Test, Ur: NEGATIVE

## 2011-05-06 LAB — URINE MICROSCOPIC-ADD ON

## 2011-05-06 LAB — CBC
Hemoglobin: 9.8 — ABNORMAL LOW
RDW: 15.8 — ABNORMAL HIGH

## 2011-05-10 NOTE — Discharge Summary (Signed)
NAMEMarland Kitchen  [name], [name]               ACCOUNT NO.:  [id number]  MEDICAL RECORD NO.:  [id number]  LOCATION:  4705                         FACILITY:  [facility]  PHYSICIAN:  Pearlean Brownie, M.D.DATE OF BIRTH:  10-15-1975  DATE OF ADMISSION:  05/03/2011 DATE OF DISCHARGE:  05/06/2011                              DISCHARGE SUMMARY   PRIMARY CARE PROVIDER:  Gentry Fitz.  REASON FOR ADMISSION:  Pyelonephritis.  DISCHARGE DIAGNOSES: 1. Pyelonephritis. 2. Sepsis. 3. Nausea and vomiting. 4. Diabetes mellitus. 5. Polycystic ovarian syndrome.  BRIEF HOSPITAL COURSE:  [name] is a 35 year old female who presented with 6 days of lower back pain and suprapubic pain that had worsened 2 days prior to admission.  She was seen in the ED for her symptoms and found to be tachycardic and febrile to 102.4 degrees Fahrenheit.  On further testing, she was found to have an urinary tract infection with CT evidence of pyelonephritis.  She was admitted to the [facility] and was started on initially IV ciprofloxacin and transitioned over to Rocephin.  She received 3 days of IV antibiotics total and was transitioned to p.o. Keflex.  During this time, she was having nausea and vomiting, but was able to be controlled well with Zofran.  Day prior discharge, she did have difficulty tolerating p.o. Keflex, but was able to when she was premedicated with Zofran.  At the time of discharge, she was tolerating p.o. medication well and having adequate p.o. intake.  During this hospitalization, a hemoglobin A1c was obtained due to remote history of diabetes mellitus and hyperlipidemia.  Hemoglobin A1c revealed patient does in fact had diabetes mellitus and her hemoglobin A1c was 6.6.  She currently is not receiving any formal primary care and has elected to follow up with [facility].  DISCHARGE MEDICATIONS: 1. She is to take Keflex 500 mg p.o. 3 times a day for total of  14     days of antibiotics which will be 10 additional days. 2. She is to premedicate with Zofran 4 mg 1 tab p.o. q.6 hours p.r.n.     nausea and vomiting.  She may take Tylenol 650 p.o. q.6 hours. 3. Ibuprofen 600 mg p.o. daily as needed. 4. She has albuterol for a remote history of asthma.  Medications to stop include Goody's powders.  The patient's condition at time of discharge  was improved.  PENDING TEST RESULTS:  None.  DISPOSITION:  The patient is to follow up with [facility], Dr. Berline Chough on October 25th at 2:30 p.m.  She was provided the telephone number and was instructed to call if she is unable to make her appointment to reschedule.  She was also provided instructions that if she is unable to tolerate any of her medications that she is to call this number to have her antibiotics changed and emphasized the importance of finishing her course of antibiotics.  FOLLOWUP ISSUES: 1. Follow up urinary tract infection.  Ensure she is not having any     frequency.  She was not having any dysuria at the time of     presentation and this would not expected to be seen and  she was     still symptomatic.  Of note, an urine culture was not revealing     during this hospitalization for her urinary tract infection has     appeared to be contaminated.  However, it did show gross bacteria     and gross blood. 2. Diabetes mellitus, the patient is attempting diet control.  She     will need her hemoglobin A1c followed up in 3 months.  At that     time, it may be appropriate to start metformin, especially in the     setting of her PCOS. 3. Asthma, follow up patient's use of albuterol and consider need for     controller med if utilizing this medication too frequently.    ______________________________ Gaspar Bidding, DO   ______________________________ Pearlean Brownie, M.D.    MR/MEDQ  D:  05/06/2011  T:  05/06/2011  Job:  [id number]  Electronically Signed by  Gaspar Bidding  on 05/10/2011 01:58:16 PM Electronically Signed by Pearlean Brownie M.D. on 05/10/2011 02:55:37 PM

## 2011-05-19 ENCOUNTER — Ambulatory Visit (INDEPENDENT_AMBULATORY_CARE_PROVIDER_SITE_OTHER): Payer: Medicaid Other | Admitting: Sports Medicine

## 2011-05-19 ENCOUNTER — Encounter: Payer: Self-pay | Admitting: Sports Medicine

## 2011-05-19 DIAGNOSIS — E282 Polycystic ovarian syndrome: Secondary | ICD-10-CM | POA: Insufficient documentation

## 2011-05-19 DIAGNOSIS — E119 Type 2 diabetes mellitus without complications: Secondary | ICD-10-CM | POA: Insufficient documentation

## 2011-05-19 DIAGNOSIS — E1165 Type 2 diabetes mellitus with hyperglycemia: Secondary | ICD-10-CM | POA: Insufficient documentation

## 2011-05-19 DIAGNOSIS — B373 Candidiasis of vulva and vagina: Secondary | ICD-10-CM

## 2011-05-19 DIAGNOSIS — B3731 Acute candidiasis of vulva and vagina: Secondary | ICD-10-CM

## 2011-05-19 MED ORDER — FLUCONAZOLE 150 MG PO TABS: 150.0000 mg | ORAL_TABLET | Freq: Once | ORAL | Status: AC

## 2011-05-19 MED ORDER — FOLIC ACID 400 MCG PO TABS: 400.0000 ug | ORAL_TABLET | Freq: Every day | ORAL | Status: AC

## 2011-05-19 MED ORDER — METFORMIN HCL 500 MG PO TABS: 500.0000 mg | ORAL_TABLET | Freq: Every day | ORAL | Status: DC

## 2011-05-19 NOTE — Patient Instructions (Signed)
Good to see you today.  I'm glad you are feeling better for the most part.  Regarding the symptoms you are having I am prescribing Diflucan by mouth.  Take 1 tab today.  If symptoms persist in 4 days repeat.  If still present in 8 days call for follow up appointment.  I am also starting Metformin to help with your blood sugars and potentially help your PCOS.  Please start a prenatal multi-vitamin and folic acid daily.  Please return in 3 months to follow up your blood sugars and to see how you are doing.

## 2011-05-22 NOTE — Progress Notes (Signed)
Subjective:  [name] is a 35 y.o. female presenting today for hospital follow-up  From a pyleonephritis.  She has undergone a 14 day course of Keflex and reports feeling close to her baseline.  She has resumed her activity level and is watching what she eats and has cut down on then number of soft drinks / day.   Pt wishes to be pregnant.  In safe relationship.   ROS: Denies: dysuria, frequency, flank pain Denies: dyspnea, orthopnea, orthostasis Denies: malise, faitigue Positive: vaginal discharge and odor; dyspyruina Denies: hx of STI, abnormal vaginal bleeding; no postcoital vaginal bleeding    PE: GENERAL:  Adult African-American female, examined in [facility].  Alert and appropriate.  In no discomfort; no respiratory distress. HNEENT: extra ocular movement intact, sclera clear, anicteric and oropharynx clear, no lesions THORAX: HEART: S1, S2 normal, no murmur, rub or gallop, regular rate and rhythm LUNGS: clear to auscultation, no wheezes or rales and unlabored breathing ABDOMEN:  no rebound tenderness, no guarding or rigidity, no CVA tenderness EXTREMITIES: no deformity, warm well perfused.

## 2011-05-23 ENCOUNTER — Encounter: Payer: Self-pay | Admitting: Sports Medicine

## 2011-05-23 NOTE — Assessment & Plan Note (Signed)
Diet and lifestyle modication are 1o treatment.  Will start metformin due to 2o PCOS dx.  Will recheck A1C in 3 months.

## 2011-05-23 NOTE — Assessment & Plan Note (Addendum)
Keflex associated.  Will emperically tx.  If sx not resolved in 1 week will have pt call and make appointment for pelvic exam.

## 2011-05-23 NOTE — Assessment & Plan Note (Addendum)
Prior diagnosis on [place] per OB.  Will start metformin.  Will also start prenatal and folic acid.  Prenatal counseling reviewed.

## 2011-05-31 NOTE — H&P (Signed)
NAMEMarland Kitchen  Tammy, [name]               ACCOUNT NO.:  [id number]  MEDICAL RECORD NO.:  [id number]  LOCATION:  [facility]                         FACILITY:  [facility]  PHYSICIAN:  Leighton Roach Emmalina Espericueta, M.D.DATE OF BIRTH:  12/01/1975  DATE OF ADMISSION:  05/03/2011 DATE OF DISCHARGE:                             HISTORY & PHYSICAL   PRIMARY CARE PROVIDER:  Unassigned.  CHIEF COMPLAINT:  Abdominal pain, back pain, fever, urinary frequency.  HISTORY OF PRESENT ILLNESS:  [name] is a 35 year old female presenting with 6 days of lower back pain and suprapubic pain.  Also endorses positive urinary frequency, positive urinary retention, positive fever, positive chills, nausea and vomiting x2 days, went to the ER [facility] yesterday for these symptoms, but left before being seen.  The patient states that she felt worse this morning and began to have dizziness and worsening symptoms as per above, so she came to the ER for evaluation.  REVIEW OF SYSTEMS:  As per above.  Occasional chest pain "with asthma flares."  No shortness of breath.  Occasional lower extremity edema.  No rash.  No syncope.  No changes in vision.  No changes in bowel.  PAST MEDICAL HISTORY: 1. Asthma. 2. Hypertension. 3. Migraines. 4. PCOS. 5. Diabetes. 6. Anemia.  PAST SURGICAL HISTORY:  C-section.  SOCIAL HISTORY:  Has boyfriend who lives with children.  No alcohol.  No tobacco. No drugs.  FAMILY HISTORY:  Diabetes and hypertension.  ALLERGIES:  No known drug allergies.  MEDICATIONS:  Albuterol p.r.n.  PHYSICAL EXAM:  VITAL SIGNS:  Pulse 104, blood pressure 127/85, respiratory rate 18, pulse ox 96% on room air, temp 97.9. GENERAL:  Alert and cooperative. HEENT:  Pupils are equal, round, and reactive to light and accommodation, and extraocular movements intact. HEART:  S1,S2 normal.  No murmurs, rubs or gallops.  Regular rate and rhythm. LUNGS:  Clear to auscultation.  No wheezing.  No rales and breathing  is nonlabored. BACK:  Positive CVA tenderness bilaterally.  No tenderness along the spine.  Abdomen, mild tenderness in suprapubic area.  No rebound or guarding. EXTREMITIES:  Normal.  No edema, atraumatic. SKIN:  No rashes. NEUROLOGY:  Normal without focal findings, alert and oriented x3.  LABS AND IMAGING:  UA positive for large hemoglobin, small leukocytes, negative nitrites.  CBC showed a white count of 15.7 hemoglobin 10.8, MCV 70.6.  Sodium 139 potassium 3.9, creatinine 0.9.  Pregnancy test negative.  CT of abdomen and pelvis showed slight swelling in the left kidney, perinephric stranding.  Findings are most consistent with pyelonephritis.  Differential diagnosis also includes mild residual swelling related to past renal stone.  No visible remaining urinary tract stone.  ASSESSMENT AND PLAN:  [name] is a 35 year old female presenting with suprapubic pain, cerebrovascular accident tenderness, positive fever, elevated white blood cell count, tachycardia, and positive urinary tract infection.  1. Pyelonephritis.  UA positive for UTI, positive fever, elevated     white blood cell count.  Positive CVA tenderness.  Positive     suprapubic pain.  Positive CT scan showing perinephric stranding.     All these findings support diagnosis of pyelonephritis, the patient     given Bactrim  and Cipro in ER for up to date recommendations we     will give 1 dose of ceftriaxone since culture and sensitivity still     pending.  We will continue with Cipro p.o. b.i.d., called hospital     pharmacist to confirm the Cipro use is supported the [facility]     diagram.  Pharmacy decreased Cipro but still a recommended agent     for treatment of pyelo.  The patient has received 3 L of normal     saline while in the ER.  Heart rate now improved to 104,     temperature now resolved.  We will monitor on telemetry for now. 2. Nausea and vomiting secondary to pyelonephritis.  We will treat      with Zofran as needed.  Can also give Phenergan. 3. Back pain, suprapubic pain.  We will treat with Toradol q.6 h. as     needed for pain, but able to tolerate p.o. for transition to p.o.     pain medication.  Pain should improve over the next 24 hours for     treatment of pyelo. 4. Asthma.  We will continue albuterol p.r.n. as per home med regimen. 5. Tachycardia, now improved.  Most likely secondary to pyelo and     fever. 6. Heart rate now is 104, status post 3 L of normal saline.  We will     monitor closely on telemetry. 7. Anemia.  Hemoglobin 10, per patient baseline, to be followed up as     an outpatient.  No action or treatment needed at this time, except     for possible iron supplementations. 8. Fluids, electrolytes, and nutrition/gastrointestinal.  Clear     liquids.  We will advance as tolerated 9. Prophylaxis heparin 5000 units subcu t.i.d. 10.Disposition, pending clinical improvement.     Tammy Mayhew, MD   ______________________________ Leighton Roach Azhar Knope, M.D.    DC/MEDQ  D:  05/03/2011  T:  05/03/2011  Job:  [id number]  Electronically Signed by [name]  on 05/29/2011 12:11:04 PM Electronically Signed by Acquanetta Belling M.D. on 05/31/2011 04:29:29 PM

## 2011-07-24 ENCOUNTER — Inpatient Hospital Stay (HOSPITAL_COMMUNITY)
Admission: AD | Admit: 2011-07-24 | Discharge: 2011-07-24 | Disposition: A | Discharge status: Home / Self Care | Payer: Medicaid Other | Source: Ambulatory Visit | Attending: Obstetrics & Gynecology | Admitting: Obstetrics & Gynecology

## 2011-07-24 ENCOUNTER — Encounter (HOSPITAL_COMMUNITY): Payer: Self-pay

## 2011-07-24 DIAGNOSIS — R112 Nausea with vomiting, unspecified: Secondary | ICD-10-CM

## 2011-07-24 DIAGNOSIS — I10 Essential (primary) hypertension: Secondary | ICD-10-CM

## 2011-07-24 DIAGNOSIS — N912 Amenorrhea, unspecified: Secondary | ICD-10-CM

## 2011-07-24 DIAGNOSIS — N911 Secondary amenorrhea: Secondary | ICD-10-CM

## 2011-07-24 HISTORY — DX: Unspecified abnormal cytological findings in specimens from cervix uteri: R87.619

## 2011-07-24 HISTORY — DX: Reserved for concepts with insufficient information to code with codable children: IMO0002

## 2011-07-24 LAB — URINALYSIS, ROUTINE W REFLEX MICROSCOPIC
Glucose, UA: 100 mg/dL — AB
Leukocytes, UA: NEGATIVE
Nitrite: NEGATIVE
Protein, ur: NEGATIVE mg/dL
Urobilinogen, UA: 1 mg/dL (ref 0.0–1.0)

## 2011-07-24 LAB — POCT PREGNANCY, URINE: Preg Test, Ur: NEGATIVE

## 2011-07-24 MED ORDER — ONDANSETRON 8 MG PO TBDP
8.0000 mg | ORAL_TABLET | Freq: Once | ORAL | Status: AC
  Administered 2011-07-24: 8 mg via ORAL
  Filled 2011-07-24: qty 1

## 2011-07-24 MED ORDER — LABETALOL HCL 100 MG PO TABS
200.0000 mg | ORAL_TABLET | Freq: Once | ORAL | Status: AC
  Administered 2011-07-24: 200 mg via ORAL
  Filled 2011-07-24: qty 2

## 2011-07-24 MED ORDER — LISINOPRIL 10 MG PO TABS: 10.0000 mg | ORAL_TABLET | Freq: Every day | ORAL | Status: DC

## 2011-07-24 MED ORDER — ONDANSETRON HCL 4 MG PO TABS: 4.0000 mg | ORAL_TABLET | Freq: Three times a day (TID) | ORAL | Status: AC | PRN

## 2011-07-24 MED ORDER — HYDROCHLOROTHIAZIDE 12.5 MG PO CAPS: 12.5000 mg | ORAL_CAPSULE | Freq: Every day | ORAL | Status: DC

## 2011-07-24 NOTE — Progress Notes (Signed)
Pt vomiting  For 2 1/2 weeks.

## 2011-07-24 NOTE — Progress Notes (Signed)
Onset of vomiting about 2 weeks ago pain in mid chest has not taken anything. LMP 06/19/11

## 2011-07-24 NOTE — ED Provider Notes (Signed)
History   [name] is a 35 y.o. year old G3P2 female at [redacted]w[redacted]d weeks gestation  By LMP who had a faintly pos UPT ~ 2 days ago who presents to MAU reporting N/V  X 2+ weeks and epigastric/sternal pain w/ vomiting . She denies VB or cramping. She wasDx w/ CHTN in the last year and started meds, but has not continued and does not know what she was on.    CSN: [id number]  Arrival date & time 07/24/11  1719   None     Chief Complaint  Patient presents with  . Emesis During Pregnancy  . Abdominal Pain    (Consider location/radiation/quality/duration/timing/severity/associated sxs/prior treatment) HPI  Past Medical History  Diagnosis Date  . Asthma   . Hypertension   . Migraines   . Polycystic ovarian disease   . Diabetes mellitus   . Anemia   . Abnormal Pap smear     Past Surgical History  Procedure Date  . Cesarean section   . Cervix lesion destruction     Family History  Problem Relation Age of Onset  . Diabetes Mother   . Hypertension Mother   . Hypertension Father   . Diabetes Father     History  Substance Use Topics  . Smoking status: Never Smoker   . Smokeless tobacco: Not on file  . Alcohol Use: No    OB History    Grav Para Term Preterm Abortions TAB SAB Ect Mult Living   3 2        2       Review of Systems  Constitutional: Negative for fever and chills.  Respiratory: Negative for chest tightness and shortness of breath.   Cardiovascular: Negative for chest pain and palpitations.  Gastrointestinal: Positive for nausea and vomiting. Negative for abdominal pain.  Genitourinary: Negative for vaginal bleeding.    Allergies  Review of patient's allergies indicates no known allergies.  Home Medications  No current outpatient prescriptions on file.  BP 165/109  Pulse 110  Resp 18  Ht 4\' 11"  (1.499 m)  Wt 97.523 kg (215 lb)  BMI 43.42 kg/m2  LMP 06/19/2011 Patient Vitals for the past 24 hrs:  BP Pulse Resp Height Weight  07/24/11 1834  165/109 mmHg 110  - - -  07/24/11 1832 158/100 mmHg 106  - - -  07/24/11 1803 149/102 mmHg 110  18  - -  07/24/11 1734 166/112 mmHg 106  16  4\' 11"  (1.499 m) 97.523 kg (215 lb)   Physical Exam  Constitutional: She is oriented to person, place, and time. She appears well-developed and well-nourished. No distress.  Cardiovascular: Tachycardia present.   Pulmonary/Chest: Effort normal.  Abdominal: Soft. There is no tenderness.  Neurological: She is alert and oriented to person, place, and time.  Skin: Skin is warm and dry.  Psychiatric: She has a normal mood and affect.    ED Course  Procedures (including critical care time) Results for orders placed during the hospital encounter of 07/24/11 (from the past 24 hour(s))  URINALYSIS, ROUTINE W REFLEX MICROSCOPIC     Status: Abnormal   Collection Time   07/24/11  5:38 PM      Component Value Range   Color, Urine YELLOW  YELLOW    APPearance CLEAR  CLEAR    Specific Gravity, Urine 1.020  1.005 - 1.030    pH 6.0  5.0 - 8.0    Glucose, UA 100 (*) NEGATIVE (mg/dL)   Hgb urine dipstick NEGATIVE  NEGATIVE    Bilirubin Urine NEGATIVE  NEGATIVE    Ketones, ur NEGATIVE  NEGATIVE (mg/dL)   Protein, ur NEGATIVE  NEGATIVE (mg/dL)   Urobilinogen, UA 1.0  0.0 - 1.0 (mg/dL)   Nitrite NEGATIVE  NEGATIVE    Leukocytes, UA NEGATIVE  NEGATIVE   POCT PREGNANCY, URINE     Status: Normal   Collection Time   07/24/11  5:48 PM      Component Value Range   Preg Test, Ur NEGATIVE    HCG, QUANTITATIVE, PREGNANCY     Status: Normal   Collection Time   07/24/11  7:06 PM      Component Value Range   hCG, Beta Chain, Quant, S <1  <5 (mIU/mL)   No vomiting in MAU. Nausea resolved w/ Zofran  MDM  Assessment: 1. CHTN 2. Secondary amenorrhea possible due to PCOS 3. N/V of unknown etiology  Plan: 1. D/C home 2. Rx Zofran 3. Rx Lisinopril and HCTZ per consult w/ Dr. Despina Hidden 4. F/U w/ [facility] this week for BP check and further eval of amenorrhea and  N/V  Dorathy Kinsman 07/24/2011 8:43 PM

## 2011-08-26 ENCOUNTER — Ambulatory Visit: Payer: Medicaid Other | Admitting: Sports Medicine

## 2011-11-18 ENCOUNTER — Telehealth: Payer: Self-pay | Admitting: Sports Medicine

## 2011-11-18 NOTE — Telephone Encounter (Signed)
Called and informed pt that she will have to come into the office to be seen for this. In the meantime Dr. Berline Chough suggests that the pt take some IBU. Pt understood and stated that she has been taking IBU for cramping. Pt has an appt on 4.29.2013 @ 330 with Dr. Berline Chough.Loralee Pacas Sayner

## 2011-11-18 NOTE — Telephone Encounter (Signed)
Pt is having a hard time with her menses again and has been bleeding for 3 weeks.  Wants to know if she can have something called in to help this.  Couldn't get time off until Thurs 5/2 Wants to talk to Dr Berline Chough

## 2011-11-22 ENCOUNTER — Ambulatory Visit (INDEPENDENT_AMBULATORY_CARE_PROVIDER_SITE_OTHER): Payer: Medicaid Other | Admitting: Sports Medicine

## 2011-11-22 ENCOUNTER — Encounter: Payer: Self-pay | Admitting: Sports Medicine

## 2011-11-22 VITALS — BP 152/101 | HR 93 | Temp 97.6°F | Ht 59.0 in | Wt 218.6 lb

## 2011-11-22 DIAGNOSIS — R5383 Other fatigue: Secondary | ICD-10-CM

## 2011-11-22 DIAGNOSIS — N7013 Chronic salpingitis and oophoritis: Secondary | ICD-10-CM

## 2011-11-22 DIAGNOSIS — I10 Essential (primary) hypertension: Secondary | ICD-10-CM

## 2011-11-22 DIAGNOSIS — R7301 Impaired fasting glucose: Secondary | ICD-10-CM

## 2011-11-22 DIAGNOSIS — N946 Dysmenorrhea, unspecified: Secondary | ICD-10-CM

## 2011-11-22 DIAGNOSIS — E282 Polycystic ovarian syndrome: Secondary | ICD-10-CM

## 2011-11-22 DIAGNOSIS — E119 Type 2 diabetes mellitus without complications: Secondary | ICD-10-CM

## 2011-11-22 DIAGNOSIS — R5381 Other malaise: Secondary | ICD-10-CM

## 2011-11-22 DIAGNOSIS — N7011 Chronic salpingitis: Secondary | ICD-10-CM

## 2011-11-22 LAB — CBC
HCT: 31.3 % — ABNORMAL LOW (ref 36.0–46.0)
Hemoglobin: 9.9 g/dL — ABNORMAL LOW (ref 12.0–15.0)
MCHC: 31.6 g/dL (ref 30.0–36.0)
RBC: 4.07 MIL/uL (ref 3.87–5.11)
WBC: 11.8 10*3/uL — ABNORMAL HIGH (ref 4.0–10.5)

## 2011-11-22 LAB — TSH: TSH: 2.031 u[IU]/mL (ref 0.350–4.500)

## 2011-11-22 LAB — POCT GLYCOSYLATED HEMOGLOBIN (HGB A1C): Hemoglobin A1C: 6.3

## 2011-11-22 NOTE — Patient Instructions (Signed)
It was good to see you today.  We are going to get some labs today to check on your hormones.    I would like to discuss your BP as well as your depression at a visit in the next 2-3 weeks as well as perform a PAP smear.  I have sent in a prescription for birth control pills to help with your bleeding I would like for you to take these for the next 2 months.  Remember we have a 24 hour emergency line if you need to contact us in the event of an emergency or if you are unsure if you need to be evaluated in the Emergency Department or if your issue can wait until the our clinic opens in the morning.  Please call our office [phone]) and follow the instructions to reach our paging service.  If you have a life or limb threatening emergency, proceed to the [facility] Emergency Department or call 911.

## 2011-11-24 ENCOUNTER — Ambulatory Visit: Payer: Medicaid Other | Admitting: Sports Medicine

## 2011-12-04 DIAGNOSIS — I10 Essential (primary) hypertension: Secondary | ICD-10-CM | POA: Insufficient documentation

## 2011-12-04 NOTE — Progress Notes (Signed)
HPI:  [name] is a 36 y.o. female presenting today for follow-up of Diabetes, HTN, dysmenorrhea and fertility issues.    DIABETES: medication compliance: compliant all of the time, diabetic diet compliance: compliant all of the time, further diabetic ROS: no polyuria or polydipsia, no chest pain, dyspnea or TIA's, no numbness, tingling or pain in extremities  HYPERTENSION: taking medications as instructed, no medication side effects noted, no TIA's, no chest pain on exertion, no dyspnea on exertion and no swelling of ankles.   She reports very irregular periods in the past.  ~3 months without bleeding followed but current 3 weeks with heavy clots and ~10 pads/tampons per day. Has had recent + home pregnancy test followed by    ROS PER HPI  HISTORY Medications Reviewed & Updated, see associated section Medical Hx Reviewed: Significant for HTN, DM, 2 pregnancies Social History Reviewed:  Significant for non-smoker  PE: GENERAL:  Adult obese AA female.  Examined in [facility]  Sitting comfortably in chair  In no discomfort; norespiratory distress.   PSYCH: Alert and appropriately interactive; Insight:Good   H&N: AT/[place], MMM, no scleral icterus, EOMi,  THORAX: HEART: RRR, S1/S2 heard, no murmur LUNGS: CTA B, no wheezes, no crackles ABDOMEN:  +BS, soft, non-tender, no rigidity, no guarding, no masses/organomegaly EXTREMITIES: Moves all 4 extremities spontaneously, warm well perfused, no edema, bilateral DP and PT pulses 2/4.

## 2011-12-04 NOTE — Assessment & Plan Note (Signed)
Stable/Well Controlled - no changes at this time. Continue weight loss and salt restriction; increase K+ in diet

## 2011-12-04 NOTE — Assessment & Plan Note (Addendum)
Given clinical presenstation pt with strong clinical diagnosis inspite of no radiographic/ultrasonographic evidence of cysts.  Will treat with OCPs at this time for menses suppression; on metformin.  Pt wishing to become pregnant in near future.  Consider Clomid.  Pt on folic acid and prenatals

## 2011-12-04 NOTE — Assessment & Plan Note (Signed)
Has prolonged menses X 3 weeks.  Will trial short OCPs.  Discussed risks/benefits of OCPs with pt as well as hopeful increased likelihood of  pregnancy following cessation.

## 2011-12-04 NOTE — Assessment & Plan Note (Signed)
Discussed her continued weight loss;  She has successfully continued to trend downward with significant life style modifications.  A1c today 6.3.  Will continue to monitor closely with additional pharmacologic intervention if needed but anticipate she will do well with continued lifestyle modification

## 2012-01-04 ENCOUNTER — Ambulatory Visit: Payer: Medicaid Other | Admitting: Sports Medicine

## 2012-02-03 ENCOUNTER — Other Ambulatory Visit (HOSPITAL_COMMUNITY)
Admission: RE | Admit: 2012-02-03 | Discharge: 2012-02-03 | Disposition: A | Discharge status: Home / Self Care | Payer: Medicaid Other | Source: Ambulatory Visit | Attending: Family Medicine | Admitting: Family Medicine

## 2012-02-03 ENCOUNTER — Ambulatory Visit (INDEPENDENT_AMBULATORY_CARE_PROVIDER_SITE_OTHER): Payer: Medicaid Other | Admitting: Sports Medicine

## 2012-02-03 ENCOUNTER — Encounter: Payer: Self-pay | Admitting: Sports Medicine

## 2012-02-03 VITALS — BP 126/84 | HR 88 | Temp 98.5°F | Ht 60.0 in | Wt 220.7 lb

## 2012-02-03 DIAGNOSIS — E119 Type 2 diabetes mellitus without complications: Secondary | ICD-10-CM

## 2012-02-03 DIAGNOSIS — Z01419 Encounter for gynecological examination (general) (routine) without abnormal findings: Secondary | ICD-10-CM | POA: Insufficient documentation

## 2012-02-03 DIAGNOSIS — Z124 Encounter for screening for malignant neoplasm of cervix: Secondary | ICD-10-CM

## 2012-02-03 DIAGNOSIS — N926 Irregular menstruation, unspecified: Secondary | ICD-10-CM

## 2012-02-03 DIAGNOSIS — F329 Major depressive disorder, single episode, unspecified: Secondary | ICD-10-CM

## 2012-02-03 DIAGNOSIS — Z0000 Encounter for general adult medical examination without abnormal findings: Secondary | ICD-10-CM

## 2012-02-03 DIAGNOSIS — E282 Polycystic ovarian syndrome: Secondary | ICD-10-CM

## 2012-02-03 DIAGNOSIS — N946 Dysmenorrhea, unspecified: Secondary | ICD-10-CM

## 2012-02-03 DIAGNOSIS — I10 Essential (primary) hypertension: Secondary | ICD-10-CM

## 2012-02-03 LAB — POCT URINE PREGNANCY: Preg Test, Ur: NEGATIVE

## 2012-02-03 LAB — POCT GLYCOSYLATED HEMOGLOBIN (HGB A1C): Hemoglobin A1C: 6.4

## 2012-02-03 MED ORDER — METOPROLOL TARTRATE 25 MG PO TABS: 25.0000 mg | ORAL_TABLET | Freq: Two times a day (BID) | ORAL | Status: DC

## 2012-02-03 MED ORDER — FLUOXETINE HCL 20 MG PO TABS: 20.0000 mg | ORAL_TABLET | Freq: Every day | ORAL | Status: DC

## 2012-02-03 NOTE — Patient Instructions (Addendum)
It was good to see you. I will send you the results of your PAP in the mail.  I am starting you on Prozac.   Please remember what we discussed regarding if you have thoughts/plan to hurt yourself or others. I am stopping your Lisinopril and starting you on metoprolol.    Please return to see me in 1 week.

## 2012-02-08 DIAGNOSIS — F329 Major depressive disorder, single episode, unspecified: Secondary | ICD-10-CM

## 2012-02-08 HISTORY — DX: Major depressive disorder, single episode, unspecified: F32.9

## 2012-02-08 NOTE — Assessment & Plan Note (Addendum)
Patient is hoping to become pregnant yet is most likely anovulatory due to her PCO S.  Her infertility is a primary issue and contributing to her depression  Does wish to be pregnant, negative pregnancy test today.

## 2012-02-08 NOTE — Assessment & Plan Note (Signed)
She does report some suicidal thoughts however no suicidal ideations, no plan, no intent.  Her daughter is accompanying her for both the exam and brief interview following an does not feel as though her mom is a danger to herself or to others.  We'll start Prozac and have close followup next week to discuss further.    Did have her complete a pH q. 9 that was markedly positive with patient reporting questions 136+9 as more than half days; question 7 and 8 were several days a week; reported somewhat difficult of a problem  She also completed mood disorder questionnaire positive for the following (  Irritability  Sleeping less  Racing thoughts  Trouble concentrating  More active Yes to question to is a moderate problem she has had family members with bipolar disorder.  She has never had a diagnosis of bipolar disorder in the past  I will follow closely in 1 week

## 2012-02-08 NOTE — Assessment & Plan Note (Addendum)
Patient has been stable on on lisinopril has chlorothiazide however it is wishing to become pregnant.  Will stop ACE inhibitor and start her on a beta blocker at this time.

## 2012-02-08 NOTE — Assessment & Plan Note (Addendum)
Still having infrequent and abnormal menstrual periods,  Does wish become pregnant, negative pregnancy test today.

## 2012-02-08 NOTE — Progress Notes (Signed)
  [facility]  Patient name: [name] MRN [id number]  Date of birth: [birth year]  CC & HPI  [name] is a 36 y.o. female presenting today for PAP smear.  Patient reports for Pap smear that is due.  At the end of the encounter she does report that her depression has become increasingly severe and she wishes for an antidepressant.  She does report some suicidal thoughts however no suicidal ideations, no plan, no intent.  Her daughter is accompanying her for both the exam and brief interview following an does not feel as though her mom is a danger to herself or to others.  ROS   per HPI  Pertinent History Reviewed  Medical & Surgical Hx:  Reviewed: Significant for PCOS Medications: Reviewed & Updated - see associated section Social History: Reviewed - Significant for non smoker  Objective Findings  Vitals:  Filed Vitals:   02/03/12 1341  BP: 126/84  Pulse: 88  Temp: 98.5 F (36.9 C)    ' PE: GENERAL:  Adult obese AA female.  Examined in [facility]  In no discomfort; norespiratory distress.   PSYCH: Alert and appropriately interactive; Insight:Good  report some suicidal thoughts regarding "wanting to take a break" but has no suicidal ideation, plans or concerns about her safety. H&N: AT/[place], MMM, no scleral icterus, EOMi THORAX: HEART: RRR, S1/S2 heard, no murmur LUNGS: CTA B, no wheezes, no crackles EXTREMITIES: Moves all 4 extremities spontaneously, warm well perfused, no edema   Pelvic exam: normal external genitalia, vulva, vagina, cervix, uterus and adnexa.

## 2012-02-09 ENCOUNTER — Ambulatory Visit: Payer: Medicaid Other | Admitting: Sports Medicine

## 2012-04-29 ENCOUNTER — Inpatient Hospital Stay (HOSPITAL_COMMUNITY)
Admission: AD | Admit: 2012-04-29 | Discharge: 2012-04-30 | Disposition: A | Discharge status: Hospice | Payer: Medicaid Other | Source: Ambulatory Visit | Attending: Obstetrics & Gynecology | Admitting: Obstetrics & Gynecology

## 2012-04-29 ENCOUNTER — Encounter (HOSPITAL_COMMUNITY): Payer: Self-pay | Admitting: *Deleted

## 2012-04-29 ENCOUNTER — Inpatient Hospital Stay (HOSPITAL_COMMUNITY): Payer: Medicaid Other

## 2012-04-29 DIAGNOSIS — N920 Excessive and frequent menstruation with regular cycle: Secondary | ICD-10-CM | POA: Insufficient documentation

## 2012-04-29 DIAGNOSIS — D649 Anemia, unspecified: Secondary | ICD-10-CM

## 2012-04-29 DIAGNOSIS — D500 Iron deficiency anemia secondary to blood loss (chronic): Secondary | ICD-10-CM | POA: Insufficient documentation

## 2012-04-29 DIAGNOSIS — N939 Abnormal uterine and vaginal bleeding, unspecified: Secondary | ICD-10-CM

## 2012-04-29 DIAGNOSIS — N898 Other specified noninflammatory disorders of vagina: Secondary | ICD-10-CM

## 2012-04-29 LAB — POCT PREGNANCY, URINE: Preg Test, Ur: NEGATIVE

## 2012-04-29 LAB — CBC
Hemoglobin: 7.2 g/dL — ABNORMAL LOW (ref 12.0–15.0)
MCH: 22.6 pg — ABNORMAL LOW (ref 26.0–34.0)
MCHC: 30.4 g/dL (ref 30.0–36.0)
RDW: 17.5 % — ABNORMAL HIGH (ref 11.5–15.5)

## 2012-04-29 MED ORDER — SODIUM CHLORIDE 0.9 % IV SOLN
INTRAVENOUS | Status: DC
  Administered 2012-04-29: 23:00:00 via INTRAVENOUS

## 2012-04-29 MED ORDER — MEGESTROL ACETATE 40 MG PO TABS
40.0000 mg | ORAL_TABLET | Freq: Once | ORAL | Status: AC
  Administered 2012-04-29: 40 mg via ORAL
  Filled 2012-04-29: qty 1

## 2012-04-29 NOTE — MAU Provider Note (Signed)
Chief Complaint: Vaginal Bleeding   First Provider Initiated Contact with Patient 04/29/12 2036     SUBJECTIVE HPI: [name] is a 36 y.o. G2P2 at who presents with bleeding for 2 months. States she is changing a pad q 2-3 hours. Hx blood transfusion 1-2 years ago due to heavy bleeding. 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 Has Hx of PCOS and menometrorrhagia that has not responded to OCPs, Metformin or Mirena. Temporary relief w/ Provera. Denies any other Hx of heavy bleeding. Is significantly interfering w/ quality of life, but pt would like to preserve fertility. Denies syncope, abd pain, fever.    Past Medical History  Diagnosis Date  . Asthma   . Hypertension   . Migraines   . Polycystic ovarian disease   . Diabetes mellitus   . Anemia   . Abnormal Pap smear    OB History    Grav Para Term Preterm Abortions TAB SAB Ect Mult Living   2 2        2      # Outc Date GA Lbr Len/2nd Wgt Sex Del Anes PTL Lv   1 PAR            2 PAR              Past Surgical History  Procedure Date  . Cesarean section   . Cervix lesion destruction    History   Social History  . Marital Status: Single    Spouse Name: N/A    Number of Children: N/A  . Years of Education: N/A   Occupational History  . Not on file.   Social History Main Topics  . Smoking status: Never Smoker   . Smokeless tobacco: Not on file  . Alcohol Use: No  . Drug Use: No  . Sexually Active: Yes   Other Topics Concern  . Not on file   Social History Narrative  . No narrative on file   No current facility-administered medications on file prior to encounter.   Current Outpatient Prescriptions on File Prior to Encounter  Medication Sig Dispense Refill  . folic acid (FOLVITE) 400 MCG tablet Take 1 tablet (400 mcg total) by mouth daily.  100 tablet  3  . Prenatal Vit-Fe Fumarate-FA (PRENATAL MULTIVITAMIN) TABS Take 1 tablet by mouth daily.         Marland Kitchen albuterol (PROVENTIL HFA;VENTOLIN HFA) 108 (90 BASE) MCG/ACT inhaler Inhale 2 puffs into the lungs every 4 (four) hours as needed. Asthma attacks        No Known Allergies  ROS: Pertinent items in HPI  OBJECTIVE Blood pressure 143/92, pulse 110, temperature 98.6 F (37 C), temperature source Oral, resp. rate 20, height 4\' 11"  (1.499 m), weight 101.606 kg (224 lb), last menstrual period 02/28/2012, SpO2 100.00%. Patient Vitals for the past 24 hrs:  BP Temp Temp src Pulse Resp SpO2 Height Weight  04/29/12 2042 143/92 mmHg - - 110  - - - -  04/29/12 2040 141/85 mmHg - - 100  - - - -  04/29/12 2039 153/95 mmHg - - 101  - - - -  04/29/12 1930 137/86 mmHg 98.6 F (37 C) Oral 109  20  100 % 4\' 11"  (1.499 m) 101.606 kg (224 lb)    GENERAL: Well-developed, well-nourished, obese female in no acute distress, anxious. Color normal for race. HEENT: Normocephalic HEART: normal rate RESP: normal effort ABDOMEN: Soft,  non-tender EXTREMITIES: Nontender, no edema NEURO: Alert and oriented SPECULUM EXAM: NEFG, moderate amount or bright red blood in vault, cervix clean. No polyps. BIMANUAL: cervix closed; UTA uterus due to to body habitus, no adnexal tenderness or masses  LAB RESULTS Results for orders placed during the hospital encounter of 04/29/12 (from the past 24 hour(s))  POCT PREGNANCY, URINE     Status: Normal   Collection Time   04/29/12  7:41 PM      Component Value Range   Preg Test, Ur NEGATIVE  NEGATIVE  CBC     Status: Abnormal   Collection Time   04/29/12  7:53 PM      Component Value Range   WBC 11.6 (*) 4.0 - 10.5 K/uL   RBC 3.18 (*) 3.87 - 5.11 MIL/uL   Hemoglobin 7.2 (*) 12.0 - 15.0 g/dL   HCT 41.3 (*) 24.4 - 01.0 %   MCV 74.5 (*) 78.0 - 100.0 fL   MCH 22.6 (*) 26.0 - 34.0 pg   MCHC 30.4  30.0 - 36.0 g/dL   RDW 27.2 (*) 53.6 - 64.4 %   Platelets 352  150 - 400 K/uL    IMAGING No results found.  MAU COURSE  ASSESSMENT Menometrorrhagia Anemia secondary to  blood loss  PLAN Care of pt turned over to [facility], NP at 2100. Pelvic US ordered Pt desires transfusion  Dorathy Kinsman, [place] 04/29/2012  9:36 PM US Transvaginal Non-ob  04/29/2012  *RADIOLOGY REPORT*  Clinical Data: 36 year old female history of fibroids, Menometrorrhagia.  C-section cryotherapy.  LMP 02/28/2012.  TRANSABDOMINAL AND TRANSVAGINAL ULTRASOUND OF PELVIS Technique:  Both transabdominal and transvaginal ultrasound examinations of the pelvis were performed. Transabdominal technique was performed for global imaging of the pelvis including uterus, ovaries, adnexal regions, and pelvic cul-de-sac.  It was necessary to proceed with endovaginal exam following the transabdominal exam to visualize the endometrium.  Comparison:  CT pelvis 05/03/2011.  Pelvic ultrasound 08/21/2009.  Findings:  Uterus: Normal measuring 0.8 x 4.7 x 5.2 cm.  Endometrium: Suboptimally visualized, up to 5-6 mm in thickness, appears normal.  Right ovary:  Chronic irregular fluid collection at the right adnexa is not significantly changed since 2011 measuring 2.0 x 2.1 x 1.0 cm (see image 40/47 of the 2011 comparison).  No associated vascularity.  Otherwise normal right ovary measuring 3.1 x 1.4 x 1.5 cm.  Left ovary: Normal measuring 2.7 x 1.6 x 2.5 cm.  Other findings: No free fluid  IMPRESSION: 1.  Chronic right hydrosalpinx/adnexal fluid collection.  No significant change since 2011. 2.  Uterus and endometrium appear stable and normal.  No fibroids visible.   Original Report Authenticated By: Harley Hallmark, M.D.    US Pelvis Complete  04/29/2012  *RADIOLOGY REPORT*  Clinical Data: 36 year old female history of fibroids, Menometrorrhagia.  C-section cryotherapy.  LMP 02/28/2012.  TRANSABDOMINAL AND TRANSVAGINAL ULTRASOUND OF PELVIS Technique:  Both transabdominal and transvaginal ultrasound examinations of the pelvis were performed. Transabdominal technique was performed for global imaging of the pelvis including  uterus, ovaries, adnexal regions, and pelvic cul-de-sac.  It was necessary to proceed with endovaginal exam following the transabdominal exam to visualize the endometrium.  Comparison:  CT pelvis 05/03/2011.  Pelvic ultrasound 08/21/2009.  Findings:  Uterus: Normal measuring 0.8 x 4.7 x 5.2 cm.  Endometrium: Suboptimally visualized, up to 5-6 mm in thickness, appears normal.  Right ovary:  Chronic irregular fluid collection at the right adnexa is not significantly changed since 2011 measuring 2.0 x 2.1 x  1.0 cm (see image 40/47 of the 2011 comparison).  No associated vascularity.  Otherwise normal right ovary measuring 3.1 x 1.4 x 1.5 cm.  Left ovary: Normal measuring 2.7 x 1.6 x 2.5 cm.  Other findings: No free fluid  IMPRESSION: 1.  Chronic right hydrosalpinx/adnexal fluid collection.  No significant change since 2011. 2.  Uterus and endometrium appear stable and normal.  No fibroids visible.   Original Report Authenticated By: Harley Hallmark, M.D.    Discussed with Dr. Despina Hidden and will give IV fluids and Megace 40 mg. Po and observe.  @ 00:55 am patient is feeling better after first bag of NSS. Bleeding is minimal. Patient up to bathroom without dizziness. No problem with ambulation.  @ 02:10 patient states she is ready to go home. Scant bleeding, no pain.  BP 144/88  Pulse 98  Temp 98.6 F (37 C) (Oral)  Resp 20  Ht 4\' 11"  (1.499 m)  Wt 224 lb (101.606 kg)  BMI 45.24 kg/m2  SpO2 100%  LMP 02/28/2012   Assessment: 36 y.o. female with abnormal vaginal bleeding   Anemia  Plan:  Megace Take two tablets (40 mg) three times per day time three days,  then take two tablets (40 mg) two times per day time three days,  then take two tablets (40 mg)once per day   Follow up in GYN Clinic  I have reviewed this patient's vital signs, nurses notes, appropriate labs and imaging. I have discussed the results with the patient and she voices understanding.   Medication List     As of 04/30/2012 11:06 PM     START taking these medications         ferrous sulfate 325 (65 FE) MG tablet   Take 1 tablet (325 mg total) by mouth daily.      megestrol 20 MG tablet   Commonly known as: MEGACE   Take 2 tablets (40 mg total) by mouth daily.      CONTINUE taking these medications         albuterol 108 (90 BASE) MCG/ACT inhaler   Commonly known as: PROVENTIL HFA;VENTOLIN HFA      folic acid 400 MCG tablet   Commonly known as: FOLVITE   Take 1 tablet (400 mcg total) by mouth daily.      ibuprofen 200 MG tablet   Commonly known as: ADVIL,MOTRIN      prenatal multivitamin Tabs          Where to get your medications    These are the prescriptions that you need to pick up.   You may get these medications from any pharmacy.         ferrous sulfate 325 (65 FE) MG tablet   megestrol 20 MG tablet

## 2012-04-29 NOTE — MAU Note (Signed)
Pt reports "i have been bleeding for 2 months", states she is changing a pad q 2-3 hours. States she has had to have a blood transfusion 1 year ago due to heavy bleeding.

## 2012-04-30 MED ORDER — FERROUS SULFATE 325 (65 FE) MG PO TABS: 325.0000 mg | ORAL_TABLET | Freq: Every day | ORAL | Status: DC

## 2012-04-30 MED ORDER — MEGESTROL ACETATE 20 MG PO TABS: 40.0000 mg | ORAL_TABLET | Freq: Every day | ORAL | Status: DC

## 2012-05-14 ENCOUNTER — Encounter: Payer: Medicaid Other | Admitting: Obstetrics & Gynecology

## 2012-06-14 ENCOUNTER — Telehealth: Payer: Self-pay | Admitting: *Deleted

## 2012-06-14 DIAGNOSIS — N946 Dysmenorrhea, unspecified: Secondary | ICD-10-CM

## 2012-06-14 MED ORDER — NORGESTIMATE-ETH ESTRADIOL 0.25-35 MG-MCG PO TABS: ORAL_TABLET | ORAL | Status: DC

## 2012-06-14 NOTE — Assessment & Plan Note (Signed)
Will start on trial of OCPs although has not been significantly helpful in past Pt must follow up as scheduled if not sooner

## 2012-06-14 NOTE — Telephone Encounter (Signed)
Please call and tell pt OCPs called in.  Have tried this previously

## 2012-06-14 NOTE — Telephone Encounter (Signed)
Pt is calling because she has been bleeding for 3 months and is asking if Dr. Berline Chough will send in an RX for her to help stop the bleeding until she comes in on 12.4.13.[name], [place] ]

## 2012-06-14 NOTE — Telephone Encounter (Signed)
lmovm informing pt that Rx has been sent to pharmacy.Laureen Ochs, Viann Shove

## 2012-06-27 ENCOUNTER — Encounter: Payer: Self-pay | Admitting: Sports Medicine

## 2012-06-27 ENCOUNTER — Ambulatory Visit (INDEPENDENT_AMBULATORY_CARE_PROVIDER_SITE_OTHER): Payer: Medicaid Other | Admitting: Sports Medicine

## 2012-06-27 VITALS — BP 148/102 | HR 89 | Temp 99.1°F | Ht 60.0 in | Wt 220.3 lb

## 2012-06-27 DIAGNOSIS — N920 Excessive and frequent menstruation with regular cycle: Secondary | ICD-10-CM

## 2012-06-27 DIAGNOSIS — I10 Essential (primary) hypertension: Secondary | ICD-10-CM

## 2012-06-27 DIAGNOSIS — D500 Iron deficiency anemia secondary to blood loss (chronic): Secondary | ICD-10-CM | POA: Insufficient documentation

## 2012-06-27 DIAGNOSIS — E282 Polycystic ovarian syndrome: Secondary | ICD-10-CM

## 2012-06-27 DIAGNOSIS — J309 Allergic rhinitis, unspecified: Secondary | ICD-10-CM | POA: Insufficient documentation

## 2012-06-27 DIAGNOSIS — N946 Dysmenorrhea, unspecified: Secondary | ICD-10-CM

## 2012-06-27 DIAGNOSIS — E119 Type 2 diabetes mellitus without complications: Secondary | ICD-10-CM

## 2012-06-27 LAB — POCT GLYCOSYLATED HEMOGLOBIN (HGB A1C): Hemoglobin A1C: 6.5

## 2012-06-27 LAB — POCT HEMOGLOBIN: Hemoglobin: 7.9 g/dL — AB (ref 12.2–16.2)

## 2012-06-27 MED ORDER — FERROUS SULFATE 325 (65 FE) MG PO TABS
325.0000 mg | ORAL_TABLET | Freq: Every day | ORAL | Status: DC
Start: 2012-06-27 — End: 2012-06-27

## 2012-06-27 MED ORDER — FLUTICASONE PROPIONATE 50 MCG/ACT NA SUSP: 2.0000 | Freq: Every day | NASAL | Status: DC

## 2012-06-27 MED ORDER — NORGESTIMATE-ETH ESTRADIOL 0.25-35 MG-MCG PO TABS: ORAL_TABLET | ORAL | Status: DC

## 2012-06-27 MED ORDER — METOPROLOL TARTRATE 25 MG PO TABS: 25.0000 mg | ORAL_TABLET | Freq: Two times a day (BID) | ORAL | Status: DC

## 2012-06-27 MED ORDER — FERROUS SULFATE 325 (65 FE) MG PO TABS
325.0000 mg | ORAL_TABLET | Freq: Every day | ORAL | Status: DC
Start: 2012-06-27 — End: 2017-10-20

## 2012-06-27 MED ORDER — METFORMIN HCL 500 MG PO TABS: 500.0000 mg | ORAL_TABLET | Freq: Two times a day (BID) | ORAL | Status: DC

## 2012-06-27 MED ORDER — NAPROXEN 500 MG PO TABS: 500.0000 mg | ORAL_TABLET | Freq: Two times a day (BID) | ORAL | Status: DC

## 2012-06-27 NOTE — Patient Instructions (Addendum)
It was nice to see you today.   Today we discussed: 1. Diabetes mellitus, type 2  You DM is doing okay but I would like to start metformin to help with your infertility.  2. Menorrhagia (Heavy and painful bleeding)  Started Naproxen to be taken twice a day as needed  We are going to be referring you to Gynecology  3. Hypertension  It is elevated today.  Please start:  - metoprolol tartrate (LOPRESSOR) 25 MG tablet; Take 1 tablet (25 mg total) by mouth 2 (two) times daily.  Dispense: 180 tablet; Refill: 3  5. Anemia due to blood loss, chronic  Please start taking an Iron Pill to help your body make more red blood cells.  6. Allergies: try using saline rinses ([place] nasal spray or NetiPOT) and try using flonase daily.     Please plan to return to see me in 1 month.  If you need anything prior to seeing me please call the clinic.  Please Bring all medications with you to each appointment.

## 2012-06-27 NOTE — Assessment & Plan Note (Signed)
Asymptomatic Refilled Ferrous sulfate  Hb improving >Transfuse for Hb <7.0 or symptmatic

## 2012-06-27 NOTE — Assessment & Plan Note (Signed)
rx Flonase   saline rinse

## 2012-06-27 NOTE — Assessment & Plan Note (Addendum)
On OCPs now, bleeding has slowed down Still has pain - rx for naproxen Will have f/u in GYN (not [facility]) to discuss further treatment for bleeding, pain and infertility Hb okay today, red flags reviewed  Reviewed US performed at MAU.

## 2012-06-27 NOTE — Assessment & Plan Note (Signed)
Restarted Metformin

## 2012-06-27 NOTE — Assessment & Plan Note (Signed)
Was supposed to be on BB. Rx today, Avoid ACEi as wanting to become pregnant

## 2012-06-27 NOTE — Assessment & Plan Note (Signed)
Metformin to help with PCOS. A1c 6.5 today >continue TLC

## 2012-06-27 NOTE — Progress Notes (Signed)
  [facility]  Patient name: [name] MRN [id number]  Date of birth: 1975-09-06  CC & HPI:  [name] is a 36 y.o. female presenting today for follow up of:  # dymenorrhea:  Reports painful cramps with bleeding.  OTC meds not currently working.  Hasn't tried rx NSAID.  # menometrorrhagia: bleeding X 3 months.  Seen in MAU given Megace, made no difference.  Rx sent in 1 week ago for Sprintec which has effectively stopped bleeding, still occasional spotting. No chest pain, dyspnea, syncope/presyncop.  Has required transfusion in past.  # PCOS - not taking metformin, still interested in becoming pregnant but leaning towards definitive treatment for bleeding.  # Diabetes - not taking meds  # HYPERTENSION: not taking medications regularly as instructed, no TIA's, no chest pain on exertion, no dyspnea on exertion and no swelling of ankles.     ------------------------------------------------------------------------------------------------------------------ Medication Compliance: noncompliant some of the time  Diet Compliance: noncompliant much of the time  ------------------------------------------------------------------------------------------------------------------ New Concerns:  Allergies and nose sores:  Reports occasional nose sores, congestion, rhinorrhea, facial pressure.   ROS:  PER HPI  Pertinent History Reviewed:  Medical & Surgical Hx:  Reviewed: Significant for Hydrosalpinx, otherwise normal pelvic US Medications: Reviewed & Updated - see associated section Social History: Reviewed -  reports that she has never smoked. She does not have any smokeless tobacco history on file.  Objective Findings:  Vitals:  Filed Vitals:   06/27/12 1019  BP: 148/102  Pulse: 89  Temp: 99.1 F (37.3 C)    PE: GENERAL:  Adult Obese AA female. In no discomfort; no respiratory distress. PSYCH: Alert and appropriately interactive; Insight:Fair   H&N: AT/[place], trachea  midline EENT:  MMM, no scleral icterus, EOMi,  Nasal mucosa pale with exudate, not erythematous.  L TM clear with middle ear effusion present.  R TM with cerumen impaction HEART: RRR, S1/S2 heard, no murmur LUNGS: CTA B, no wheezes, no crackles PELVIC: deferred, [place] reviewed EXTREMITIES: Moves all 4 extremities spontaneously, warm well perfused, no edema, bilateral DP and PT pulses 2/4.      Assessment & Plan:

## 2013-03-06 ENCOUNTER — Ambulatory Visit (INDEPENDENT_AMBULATORY_CARE_PROVIDER_SITE_OTHER): Payer: Medicaid Other | Admitting: Sports Medicine

## 2013-03-06 ENCOUNTER — Encounter: Payer: Self-pay | Admitting: Sports Medicine

## 2013-03-06 VITALS — BP 146/97 | HR 116 | Temp 98.1°F | Ht 60.0 in | Wt 216.0 lb

## 2013-03-06 DIAGNOSIS — D500 Iron deficiency anemia secondary to blood loss (chronic): Secondary | ICD-10-CM

## 2013-03-06 DIAGNOSIS — E282 Polycystic ovarian syndrome: Secondary | ICD-10-CM

## 2013-03-06 DIAGNOSIS — N946 Dysmenorrhea, unspecified: Secondary | ICD-10-CM

## 2013-03-06 DIAGNOSIS — E119 Type 2 diabetes mellitus without complications: Secondary | ICD-10-CM

## 2013-03-06 DIAGNOSIS — I10 Essential (primary) hypertension: Secondary | ICD-10-CM

## 2013-03-06 LAB — COMPREHENSIVE METABOLIC PANEL
Albumin: 3.6 g/dL (ref 3.5–5.2)
BUN: 7 mg/dL (ref 6–23)
CO2: 26 mEq/L (ref 19–32)
Glucose, Bld: 103 mg/dL — ABNORMAL HIGH (ref 70–99)
Potassium: 4.2 mEq/L (ref 3.5–5.3)
Sodium: 135 mEq/L (ref 135–145)
Total Bilirubin: 0.3 mg/dL (ref 0.3–1.2)
Total Protein: 7.3 g/dL (ref 6.0–8.3)

## 2013-03-06 LAB — CBC
Platelets: 480 10*3/uL — ABNORMAL HIGH (ref 150–400)
RBC: 3.82 MIL/uL — ABNORMAL LOW (ref 3.87–5.11)
RDW: 20.5 % — ABNORMAL HIGH (ref 11.5–15.5)
WBC: 9.8 10*3/uL (ref 4.0–10.5)

## 2013-03-06 LAB — T3, FREE: T3, Free: 2.8 pg/mL (ref 2.3–4.2)

## 2013-03-06 MED ORDER — HYDROCHLOROTHIAZIDE 25 MG PO TABS: 25.0000 mg | ORAL_TABLET | Freq: Every day | ORAL | Status: DC

## 2013-03-06 MED ORDER — METFORMIN HCL 500 MG PO TABS: 500.0000 mg | ORAL_TABLET | Freq: Two times a day (BID) | ORAL | Status: DC

## 2013-03-06 MED ORDER — NAPROXEN 500 MG PO TABS: 500.0000 mg | ORAL_TABLET | Freq: Two times a day (BID) | ORAL | Status: DC

## 2013-03-06 NOTE — Assessment & Plan Note (Addendum)
Recheck CBC today Offered for patient followup with gynecology but she declines at this time. Patient likely has significant hormonal this regulation secondary to PCOS. Discussed possibility of ovulation and potential pregnancy in the future but also set realistic expectations.

## 2013-03-06 NOTE — Assessment & Plan Note (Signed)
On recheck CBC and continue iron supplementation.

## 2013-03-06 NOTE — Patient Instructions (Addendum)
It was nice to see you today, thanks for coming in!  Problem List Items Addressed This Visit   PCOS (polycystic ovarian syndrome)     Discussed lifestyle change.    Relevant Orders      T3, free   Hypertension     Start HCTZ daily. TLC discussed.    Relevant Medications      hydrochlorothiazide tablet   Other Relevant Orders      Comprehensive metabolic panel   Dysmenorrhea     Restart metformin,  >50% of this 25 minute visit spent in direct patient counseling and/or coordination of care regarding nutrition and activity     Relevant Medications      naproxen (NAPROSYN) tablet   Diabetes mellitus, type 2 - Primary   Relevant Medications      metFORMIN (GLUCOPHAGE) tablet   Other Relevant Orders      HgB A1c (Completed)   Anemia due to blood loss, chronic     On recheck CBC and continue iron supplementation.    Relevant Orders      CBC    Other Visit Diagnoses   Morbid obesity        Relevant Medications       metFORMIN (GLUCOPHAGE) tablet    Other Relevant Orders       TSH      Here are some basic Activity rules to remember: Look for exercise opportunities in your day:  Never lie down when you can sit; never sit when you can stand; never stand when you can pace.  Moving your body throughout the day is just as important as the 30 or 60 minutes of exercise at the gym!  Sleep is an integral part of our bodies ability to recover from our daily activities and is key to making you body perform at its maximal potential.  Establishing and maintaining a healthy sleep pattern can not only make you feel better it can help many chronic illnesses including high blood pressure, high cholesterol, obesity, chronic pain syndromes, and many others.  Some key things to remember regarding sleep are:  Establish a consistent nightly routine that you do each night before bed.  Avoid caffeine, tobacco and alcohol as all of these drugs will cause sleep disturbances.    Establish an exercise  routine.  This can be as simple as walking, dancing or what every you find gets your heart rate elevated to the point you can speak in only 3-4 word sentences.  Exercise will help make falling and staying asleep easier.    If you have difficulty with sleep, reserve the bedroom for sleeping; do not watch TV, read, eat or exercise in your bed room.      - Watching TV in bed can trick your brain into thinking it is day time and will reset your internal clock.  If you are going to watch TV before bed do so outside of the bedroom.  Although it is best to avoid screens for the hour prior to bed that is difficult to do in our current technology driven society - at the very least it is imperative that you remove TV from the bed room.      - If you have a hard time falling asleep avoid showering and exercising prior to bed.     Here are some basic nutrition rules to remember:  "Eat Real Foods & Drink Real Drinks" - if you think it was made in a factory . . it is  likely best to avoid it as a staple in your diet.  Limiting these types of foods to 1-2 times per week is a good idea.  Sticking with fresh fruits and vegetables as well as home cooked meals will typically provide more nutrition and less salt than prepackaged meals.     Limit the amount of sugar sweetened and artificially sweetened foods and beverages.  Sticking with water flavored with a slice of lemon, lime or orange is a great option if you want something with flavor in it.  Using flavored seltzer water to flavor plain water will also add some bite if you want something more than flavor.       Eat at least 3 meals and 1-2 snacks per day.  Aim for no more than 5 hours between eating.   Here are 2 of my favorite web sites that provide great nutrition and exercise advice.   www.eatsmartmovemore[place].com www.DisposableNylon.be   Please plan to return to see me in 1 month to review food logs.  If you need anything prior to your next visit please call  the clinic.  Please Bring all medications or accurate medication list with you to each appointment; an accurate medication list is essential in providing you the best care possible.

## 2013-03-06 NOTE — Assessment & Plan Note (Addendum)
Discussed lifestyle change. Resume metformin

## 2013-03-06 NOTE — Progress Notes (Signed)
  [facility]  Patient name: [name] MRN [id number]  Date of birth: June 28, 1976  CC & HPI:  [name] is a 37 y.o. female presenting to clinic.  Chief Complaint  Patient presents with  . Weight Gain Has continued a significant weight gain.  Interested in taking phentermine .    . abnomal periods  Continues to have irregular bleeding.  She had approximately one month of bleeding previously.  She has had a history of significant blood loss.  She denies any chest pain, shortness of breath due to her bleeding.  Her bleeding has stopped at this time.  She did not followup with gynecology .  She has not been taking the metformin.  She has not been taking birth control.  She is interested in becoming pregnant again.    . Fatigue insomnia     #  Hypertension - chronic problem, poorly controlled.    Home BP checks/ranges: Not checking   no orthostasis,   no peripheral edema  no chest pain, no dyspnea on exertion, no orthopnea/PND  no episodes of unilateral weakness, dysarthria or acute visual changes  TLC Compliance Diet: noncompliant much of the time  Exercise: noncompliant much of the time  MEDS: probably noncompliant though I cannot elicit that specific history     ROS:  PER HPI  Pertinent History Reviewed:  Medical & Surgical Hx:  Reviewed: Significant for asthma, migraines, diabetes, anemia, medical noncompliance Medications: Reviewed & Updated - see associated section Social History: Reviewed -  reports that she has never smoked. She does not have any smokeless tobacco history on file.  Objective Findings:  Vitals: BP 146/97  Pulse 116  Temp(Src) 98.1 F (36.7 C) (Oral)  Ht 5' (1.524 m)  Wt 216 lb (97.977 kg)  BMI 42.18 kg/m2  LMP 02/03/2013 PE: GENERAL:  adult morbidly obese  female. In no discomfort; no respiratory distress  PSYCH:  alert and appropriate, good insight   HNEENT:  Atraumatic, normocephalic   CARDIO:  RRR, S1/S2 heard,  no murmur  LUNGS:  CTA B, no wheezes, no crackles  ABDOMEN:  Obese   EXTREM: Warm well perfused, trace bilateral peripheral edema   GU:   SKIN:   NEUROMSK:     Assessment & Plan:   1. Diabetes mellitus, type 2   2. Dysmenorrhea   3. Anemia due to blood loss, chronic   4. Hypertension   5. PCOS (polycystic ovarian syndrome)   6. Morbid obesity    See problem associated charting

## 2013-03-06 NOTE — Assessment & Plan Note (Addendum)
patient did not beta blocker previously is probably not the best medication given her fatigue so will start HCTZ daily  Extensive time spent in discussion regarding TLC  Patient is not a candidate for phentermine.

## 2013-03-07 MED ORDER — LEVOTHYROXINE SODIUM 50 MCG PO TABS: 50.0000 ug | ORAL_TABLET | Freq: Every day | ORAL | Status: DC

## 2013-03-07 NOTE — Addendum Note (Signed)
Addended by: Gaspar Bidding D on: 03/07/2013 07:33 PM   Modules accepted: Orders

## 2013-03-07 NOTE — Assessment & Plan Note (Signed)
A1c is still 6.3. Spent extensive time in counseling with the patient.

## 2013-03-07 NOTE — Addendum Note (Signed)
Addended by: Gaspar Bidding D on: 03/07/2013 07:31 PM   Modules accepted: Orders

## 2013-04-05 ENCOUNTER — Encounter: Payer: Self-pay | Admitting: Sports Medicine

## 2013-04-05 ENCOUNTER — Ambulatory Visit (INDEPENDENT_AMBULATORY_CARE_PROVIDER_SITE_OTHER): Payer: Medicaid Other | Admitting: Sports Medicine

## 2013-04-05 VITALS — BP 156/104 | HR 96 | Temp 99.0°F | Ht 60.0 in | Wt 212.0 lb

## 2013-04-05 DIAGNOSIS — I10 Essential (primary) hypertension: Secondary | ICD-10-CM

## 2013-04-05 DIAGNOSIS — F329 Major depressive disorder, single episode, unspecified: Secondary | ICD-10-CM

## 2013-04-05 DIAGNOSIS — E119 Type 2 diabetes mellitus without complications: Secondary | ICD-10-CM

## 2013-04-05 DIAGNOSIS — E282 Polycystic ovarian syndrome: Secondary | ICD-10-CM

## 2013-04-05 DIAGNOSIS — N926 Irregular menstruation, unspecified: Secondary | ICD-10-CM

## 2013-04-05 LAB — POCT URINALYSIS DIPSTICK
Glucose, UA: NEGATIVE
Leukocytes, UA: NEGATIVE
Nitrite, UA: NEGATIVE
Urobilinogen, UA: 1

## 2013-04-05 LAB — POCT URINE PREGNANCY: Preg Test, Ur: NEGATIVE

## 2013-04-05 MED ORDER — AMLODIPINE BESYLATE 5 MG PO TABS: 5.0000 mg | ORAL_TABLET | Freq: Every day | ORAL | Status: DC

## 2013-04-05 NOTE — Assessment & Plan Note (Signed)
Stop HCTZ, start Amlodipine Avoid ACE as trying to get pregnant

## 2013-04-05 NOTE — Assessment & Plan Note (Signed)
Urine pregnancy & UA given suprapubic tenderness

## 2013-04-05 NOTE — Patient Instructions (Addendum)
It was nice to see you today, thanks for coming in!  1. Hypertension Start Amlodipine, stop HCTZ   2. Missed period We are checking a pregnancy test and Urinalysis    Please plan to return to see me in 1 month.   Return for labs in 2 weeks.  Please be fasted for 8 hours prior to having your blood drawn  If you need anything prior to your next visit please call the clinic. Please Bring all medications or accurate medication list with you to each appointment; an accurate medication list is essential in providing you the best care possible.

## 2013-04-15 ENCOUNTER — Encounter: Payer: Self-pay | Admitting: Sports Medicine

## 2013-04-15 NOTE — Progress Notes (Signed)
[facility]  KIMBERLLY NORGARD - 37 y.o. female MRN [id number]  Date of birth: January 14, 1976  HPI & ROS  [name] presents today for Medical Managment of Chronic Issues and Amenorrhea  Pt reports not having a period in the past 6 weeks.  Unsure if she could be pregnant.  Would like to be pregnant.    Chronic medical conditions include diabetes, hypertension, PCOS and depression   Specifically related to having difficulty becoming pregnant.  Pt denies chest pain, dyspnea at rest or exertion, PND, lower extremity edema. Patient denies any facial asymmetry, unilateral weakness, or dysarthria.  Denies any polyuria or polydipsia.  Patient does report mild intolerance to hydrochlorothiazide and is wanting a different medication.  She was previously started on levothyroxine for fatigue modulation and reports feeling better without palpitations.  Reports mood is better on levothyroxine.  Does not want prozac  [facility] Compliance Diet: noncompliant much of the time  Exercise: noncompliant much of the time  MEDS: compliant all of the time    Care Coordination & Pertinent History  The patients history is remarkable for: # Hypothyroidism, Diabetes & DM:   A1c @ goal on Metformin  Working on weight loss through diet and exercise # PCOS & Infertility, menometrorrhagia with anemia: Pt wishes to become pregnant  Likely difficult due to hormonal dysregulation - has repeatedly declined evaluation at OB/GYN  AVOID TERATOGENIC MEDS (ACEi)  Unclear Hx of transfusion?  Pertinent History:  Hx of C-Section  Hx of Abnormal PAP Health Care Maintenance:  Next PAP due in July 2016 Brief Social History:  No tobacco abuse  No other use  Other Providers:      Recent Labs  06/27/12 1014 03/06/13 0954 03/06/13 1144  HGBA1C 6.5 6.3  --   TSH  --   --  1.850   Wt Readings from Last 3 Encounters:  04/05/13 212 lb (96.163 kg)  03/06/13 216 lb (97.977 kg)  06/27/12 220 lb 4.8 oz  (99.927 kg)        Otherwise please see associated EMR sections for complete problem List, past medical history, past surgical history, family history and social history. Objective Findings  VITALS: BP 156/104  Pulse 96  Temp(Src) 99 F (37.2 C) (Oral)  Ht 5' (1.524 m)  Wt 212 lb (96.163 kg)  BMI 41.4 kg/m2  LMP 02/03/2013 PHYSICAL EXAM: GENERAL: Adult AA  female. In no discomfort; no respiratory distress  PSYCH: alert and appropriate, good insight   HNEENT:   CARDIO: RRR, S1/S2 heard, no murmur  LUNGS: CTA B, no wheezes, no crackles  ABDOMEN:   obese, protuberant   EXTREM:    Warm, well perfused.  Moves all 4 extremities spontaneously; no lateralization.  No noted foot lesions.  Distal pulses intact.  no pretibial edema.  Sensation is grossly intact to monofilament testing.    GU:    SKIN:     NEUROMSK:        Medication & Orders   Previous Medications   ALBUTEROL (PROVENTIL HFA;VENTOLIN HFA) 108 (90 BASE) MCG/ACT INHALER    Inhale 2 puffs into the lungs every 4 (four) hours as needed. Asthma attacks    FERROUS SULFATE 325 (65 FE) MG TABLET    Take 1 tablet (325 mg total) by mouth daily.   LEVOTHYROXINE (SYNTHROID, LEVOTHROID) 50 MCG TABLET    Take 1 tablet (50 mcg total) by mouth daily.   METFORMIN (GLUCOPHAGE) 500 MG TABLET    Take 1 tablet (500 mg  total) by mouth 2 (two) times daily with a meal.   PRENATAL VIT-FE FUMARATE-FA (PRENATAL MULTIVITAMIN) TABS    Take 1 tablet by mouth daily.     Modified Medications   No medications on file   New Prescriptions   AMLODIPINE (NORVASC) 5 MG TABLET    Take 1 tablet (5 mg total) by mouth daily.   Discontinued Medications   HYDROCHLOROTHIAZIDE (HYDRODIURIL) 25 MG TABLET    Take 1 tablet (25 mg total) by mouth daily.   NAPROXEN (NAPROSYN) 500 MG TABLET    Take 1 tablet (500 mg total) by mouth 2 (two) times daily with a meal.   Orders Placed This Encounter  Procedures  . LDL Cholesterol, Direct    Standing Status: Future      Number of Occurrences:      Standing Expiration Date: 04/05/2014  . Comprehensive metabolic panel    Standing Status: Future     Number of Occurrences:      Standing Expiration Date: 04/05/2014  . POCT urine pregnancy  . POCT urinalysis dipstick   Assessment   1. Hypertension   2. Missed period   3. PCOS (polycystic ovarian syndrome)   4. Diabetes mellitus, type 2   5. Major depressive disorder     Plan     Due for TSH recheck at end of September for Levothyroxine monitoring for fatigue, mood modulation and weight loss  Will obtain fasting labs at that visit  Check UPREG and UA   Changed BP meds to amlodipine    Follow Up Issues: > F/u BP > consider mood medication > offer referral back to OB/GYN again

## 2013-04-19 ENCOUNTER — Other Ambulatory Visit: Payer: Medicaid Other

## 2013-04-19 DIAGNOSIS — D500 Iron deficiency anemia secondary to blood loss (chronic): Secondary | ICD-10-CM

## 2013-04-19 DIAGNOSIS — E282 Polycystic ovarian syndrome: Secondary | ICD-10-CM

## 2013-04-19 DIAGNOSIS — N946 Dysmenorrhea, unspecified: Secondary | ICD-10-CM

## 2013-04-19 DIAGNOSIS — I10 Essential (primary) hypertension: Secondary | ICD-10-CM

## 2013-04-19 LAB — COMPREHENSIVE METABOLIC PANEL
Albumin: 3.7 g/dL (ref 3.5–5.2)
Alkaline Phosphatase: 111 U/L (ref 39–117)
BUN: 12 mg/dL (ref 6–23)
CO2: 26 mEq/L (ref 19–32)
Calcium: 9.2 mg/dL (ref 8.4–10.5)
Chloride: 106 mEq/L (ref 96–112)
Creat: 0.73 mg/dL (ref 0.50–1.10)
Glucose, Bld: 100 mg/dL — ABNORMAL HIGH (ref 70–99)
Potassium: 4.5 mEq/L (ref 3.5–5.3)
Sodium: 138 mEq/L (ref 135–145)
Total Protein: 7.6 g/dL (ref 6.0–8.3)

## 2013-04-19 LAB — CBC
MCH: 20.4 pg — ABNORMAL LOW (ref 26.0–34.0)
MCV: 68.6 fL — ABNORMAL LOW (ref 78.0–100.0)
WBC: 8.6 10*3/uL (ref 4.0–10.5)

## 2013-04-19 LAB — TSH: TSH: 1.366 u[IU]/mL (ref 0.350–4.500)

## 2013-04-19 NOTE — Progress Notes (Signed)
TSH,CMP,CBC AND D-LDL DONE TODAY Koden Hunzeker

## 2013-04-22 ENCOUNTER — Encounter: Payer: Self-pay | Admitting: Sports Medicine

## 2013-07-15 ENCOUNTER — Telehealth: Payer: Self-pay | Admitting: Sports Medicine

## 2013-07-15 NOTE — Telephone Encounter (Signed)
Pt was advise per Dr Berline Chough to go to [facility] hospital or to schedule an appointment to be seen today ,pt refused to see any other Dr.Sara scheduled patient to be seen with you this Monday. [name], Virgel Bouquet

## 2013-07-15 NOTE — Telephone Encounter (Signed)
"  is doing this bleeding thing again. It is very heavy and painful this time." Can you call something in? Please advisef

## 2013-07-22 ENCOUNTER — Encounter (HOSPITAL_COMMUNITY): Payer: Self-pay | Admitting: Cardiology

## 2013-07-22 ENCOUNTER — Ambulatory Visit (INDEPENDENT_AMBULATORY_CARE_PROVIDER_SITE_OTHER): Payer: Medicaid Other | Admitting: Sports Medicine

## 2013-07-22 ENCOUNTER — Ambulatory Visit (HOSPITAL_COMMUNITY)
Admission: RE | Admit: 2013-07-22 | Discharge: 2013-07-22 | Disposition: A | Discharge status: Home / Self Care | Payer: Medicaid Other | Source: Ambulatory Visit | Attending: Sports Medicine | Admitting: Sports Medicine

## 2013-07-22 ENCOUNTER — Inpatient Hospital Stay (HOSPITAL_COMMUNITY): Payer: Medicaid Other

## 2013-07-22 ENCOUNTER — Inpatient Hospital Stay (HOSPITAL_COMMUNITY)
Admission: AD | Admit: 2013-07-22 | Discharge: 2013-07-24 | DRG: 812 | Disposition: A | Discharge status: Home / Self Care | Payer: Medicaid Other | Source: Ambulatory Visit | Attending: Family Medicine | Admitting: Family Medicine

## 2013-07-22 ENCOUNTER — Encounter: Payer: Self-pay | Admitting: Sports Medicine

## 2013-07-22 VITALS — BP 144/75 | HR 120 | Temp 98.2°F | Ht 60.0 in | Wt 214.0 lb

## 2013-07-22 DIAGNOSIS — Z8249 Family history of ischemic heart disease and other diseases of the circulatory system: Secondary | ICD-10-CM

## 2013-07-22 DIAGNOSIS — N949 Unspecified condition associated with female genital organs and menstrual cycle: Secondary | ICD-10-CM

## 2013-07-22 DIAGNOSIS — Z833 Family history of diabetes mellitus: Secondary | ICD-10-CM

## 2013-07-22 DIAGNOSIS — N920 Excessive and frequent menstruation with regular cycle: Secondary | ICD-10-CM | POA: Diagnosis present

## 2013-07-22 DIAGNOSIS — Z79899 Other long term (current) drug therapy: Secondary | ICD-10-CM

## 2013-07-22 DIAGNOSIS — E282 Polycystic ovarian syndrome: Secondary | ICD-10-CM | POA: Diagnosis present

## 2013-07-22 DIAGNOSIS — E119 Type 2 diabetes mellitus without complications: Secondary | ICD-10-CM

## 2013-07-22 DIAGNOSIS — N7011 Chronic salpingitis: Secondary | ICD-10-CM

## 2013-07-22 DIAGNOSIS — D259 Leiomyoma of uterus, unspecified: Secondary | ICD-10-CM | POA: Diagnosis present

## 2013-07-22 DIAGNOSIS — D649 Anemia, unspecified: Secondary | ICD-10-CM

## 2013-07-22 DIAGNOSIS — N938 Other specified abnormal uterine and vaginal bleeding: Secondary | ICD-10-CM | POA: Diagnosis present

## 2013-07-22 DIAGNOSIS — D638 Anemia in other chronic diseases classified elsewhere: Secondary | ICD-10-CM | POA: Diagnosis present

## 2013-07-22 DIAGNOSIS — D500 Iron deficiency anemia secondary to blood loss (chronic): Principal | ICD-10-CM | POA: Diagnosis present

## 2013-07-22 DIAGNOSIS — R079 Chest pain, unspecified: Secondary | ICD-10-CM

## 2013-07-22 LAB — COMPREHENSIVE METABOLIC PANEL
AST: 12 U/L (ref 0–37)
Albumin: 3.2 g/dL — ABNORMAL LOW (ref 3.5–5.2)
Alkaline Phosphatase: 118 U/L — ABNORMAL HIGH (ref 39–117)
CO2: 25 mEq/L (ref 19–32)
Calcium: 8.7 mg/dL (ref 8.4–10.5)
Chloride: 103 mEq/L (ref 96–112)
Creatinine, Ser: 0.66 mg/dL (ref 0.50–1.10)
GFR calc non Af Amer: >90 mL/min (ref >90)
Glucose, Bld: 123 mg/dL — ABNORMAL HIGH (ref 70–99)
Potassium: 3.7 mEq/L (ref 3.5–5.1)
Total Bilirubin: 0.2 mg/dL — ABNORMAL LOW (ref 0.3–1.2)

## 2013-07-22 LAB — CBC
HCT: 23.2 % — ABNORMAL LOW (ref 36.0–46.0)
Hemoglobin: 6.6 g/dL — CL (ref 12.0–15.0)
MCHC: 28.4 g/dL — ABNORMAL LOW (ref 30.0–36.0)
MCV: 65.9 fL — ABNORMAL LOW (ref 78.0–100.0)
Platelets: 389 10*3/uL (ref 150–400)
RBC: 3.52 MIL/uL — ABNORMAL LOW (ref 3.87–5.11)
RDW: 19 % — ABNORMAL HIGH (ref 11.5–15.5)
WBC: 10 10*3/uL (ref 4.0–10.5)

## 2013-07-22 LAB — PREPARE RBC (CROSSMATCH)

## 2013-07-22 LAB — POCT HEMOGLOBIN: Hemoglobin: 6.2 g/dL — AB (ref 12.2–16.2)

## 2013-07-22 LAB — POCT URINE PREGNANCY: Preg Test, Ur: NEGATIVE

## 2013-07-22 LAB — ABO/RH: ABO/RH(D): O POS

## 2013-07-22 MED ORDER — ACETAMINOPHEN 650 MG RE SUPP: 650.0000 mg | Freq: Four times a day (QID) | RECTAL | Status: DC | PRN

## 2013-07-22 MED ORDER — ACETAMINOPHEN 325 MG PO TABS
650.0000 mg | ORAL_TABLET | Freq: Four times a day (QID) | ORAL | Status: DC | PRN
  Administered 2013-07-23 (×3): 650 mg via ORAL
  Filled 2013-07-22 (×3): qty 2

## 2013-07-22 MED ORDER — ONDANSETRON 4 MG PO TBDP
4.0000 mg | ORAL_TABLET | Freq: Three times a day (TID) | ORAL | Status: DC | PRN
  Administered 2013-07-23: 4 mg via ORAL
  Filled 2013-07-22 (×2): qty 1

## 2013-07-22 MED ORDER — SODIUM CHLORIDE 0.9 % IJ SOLN
3.0000 mL | Freq: Two times a day (BID) | INTRAMUSCULAR | Status: DC
  Administered 2013-07-22 – 2013-07-23 (×2): 3 mL via INTRAVENOUS

## 2013-07-22 MED ORDER — NORETHINDRONE-ETH ESTRADIOL 0.4-35 MG-MCG PO TABS
1.0000 | ORAL_TABLET | Freq: Four times a day (QID) | ORAL | Status: DC
  Administered 2013-07-22 – 2013-07-24 (×5): 1 via ORAL
  Filled 2013-07-22 (×13): qty 1

## 2013-07-22 MED ORDER — NORETHINDRONE-ETH ESTRADIOL 0.4-35 MG-MCG PO TABS
1.0000 | ORAL_TABLET | Freq: Four times a day (QID) | ORAL | Status: DC
Start: 2013-07-22 — End: 2013-07-22
  Filled 2013-07-22 (×3): qty 1

## 2013-07-22 NOTE — Progress Notes (Signed)
Family Practice Teaching Service Interval Progress Note  S: Patient sent from clinic for vaginal bleeding and anemia. Patient states she feels ok at this time. She has continued to bleed since transfer. States she has had some shortness of breath and chest pain with exertion over the past several weeks. Notes the pain is in the middle of her chest and is sharp in nature. She denies chest pain and shortness of breath while lying in bed.  O:BP 136/90  Pulse 108  Temp(Src) 98.6 F (37 C) (Oral)  Resp 18  Ht 5' (1.524 m)  Wt 219 lb 12.8 oz (99.701 kg)  BMI 42.93 kg/m2  SpO2 100% Gen: NAD, resting comfortably in bed CV: rrr, no murmurs appreciated Pulm: CTAB, no wheezes or crackles Abd: obese, soft, NT, ND Ext: no edema noted  A/P: Patient is a 37 yo female found to have symptomatic anemia related to menometrorrhagia.  -patient with Hgb 6.6 on CBC in hospital -will plan to transfuse 2 u RBCs for symptomatic anemia -given negative troponin will start on high dose hormone therapy with norethindrone-ethinyl estradiol 1 tablet qid for up to 7 days until the bleeding stops. -will obtain one additional troponin and will repeat EKG in the AM -will obtain transvaginal US to evaluate for lesions that could be causing this issue -will monitor of tele  Marikay Alar, MD [facility] Medicine PGY-2 Service Pager [phone]

## 2013-07-22 NOTE — H&P (Signed)
Family Medicine Teaching [facility] Admission History and Physical Service Pager: [phone]  Patient name: [name] Medical record number: [id number] Date of birth: 08-15-1975 Age: 37 y.o. Gender: female  Primary Care Provider: Gaspar Bidding, DO Consultants: None Code Status: Full  Chief Complaint: Anemia  Assessment and Plan: [name] is a 37 y.o. year old female   found to have somatic anemia due to menometrorrhagia.Marland Kitchen PMH is significant for  PCO S., known fibroids, desire for pregnancy.  # Anemia: Point-of-care 6.8 and clinic.  Recheck labs.  Transfuse 2 units.  Followup CBC following transfusion.  # Menometrorrhagia: Significant bleeding today on exam and over the past 2 months.  Using a pad every 2-3 hours.  Reimage with pelvic ultrasound to evaluate endometrium and for fibroids.  Discussed with Dr. Marice Potter that [facility] who agrees patient is appropriate to be seen in clinic as an outpatient to discuss further options including hormonal therapy and/or surgical therapy,  patient does desire to become pregnant so this will obviously complicate the picture.  Once cardiac enzymes were obtained and noted to be negative will plan to start on biphasic pill.  No indication for IV estrogen therapy.  # Chest tightness, dyspnea on exertion, fatigue: Likely resulting from symptomatic anemia.  Cycle cardiac enzymes.  No active chest pain at this time but if there has been an ischemic event 1 troponin will be sufficient to evaluate.  There are T wave inversions in the lateral leads that had been there previously.  This will be further evaluated as an outpatient.  FEN/GI:  regular diet Prophylaxis:  no heparin, SCDs  Disposition:  to telemetry for blood transfusion  History of Present Illness: [name] is a 37 y.o. year old female presenting with  two-month history of significant vaginal bleeding.  She is using a pad every 2-3 hours.  This is similar to prior episodes that resulted  in symptomatic anemia.  She has had multiple transfusions in the past.  She has undergone a cryoablation of fibroids previously.  She desires to become pregnant again but has been unable.  Currently denies any chest pain at rest but does have some shortness of breath with exertion.  Some orthostasis.  Extreme fatigue for the past 2 weeks.   Review Of Systems: Per HPI with the following additions:  No melena, hematochezia.  She denies nausea, vomiting.  She denies any lower extremity edema, facial asymmetry, unilateral weakness, or dysarthria.  Otherwise 12 point review of systems was performed and was unremarkable.  Patient Active Problem List   Diagnosis Date Noted  . Anemia 07/22/2013  . Missed period 04/05/2013  . Anemia due to blood loss, chronic 06/27/2012  . Allergic rhinitis 06/27/2012  . Major depressive disorder 02/08/2012  . Hypertension 12/04/2011  . Dysmenorrhea 11/22/2011  . Yeast infection of the vagina 05/19/2011  . Diabetes mellitus, type 2 05/19/2011  . PCOS (polycystic ovarian syndrome) 05/19/2011  . Hydrosalpinx 08/21/2009   Past Medical History: Past Medical History  Diagnosis Date  . Asthma   . Hypertension   . Migraines   . Polycystic ovarian disease   . Diabetes mellitus   . Anemia   . Abnormal Pap smear   . Major depressive disorder 02/08/2012    Reports that 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     Past Surgical History: Past Surgical History  Procedure Laterality Date  . Cesarean section    .  Cervix lesion destruction     Social History: History  Substance Use Topics  . Smoking status: Never Smoker   . Smokeless tobacco: Not on file  . Alcohol Use: No   Additional social history:   Please also refer to relevant sections of EMR.  Family History: Family History  Problem Relation Age of Onset  . Diabetes Mother   . Hypertension Mother   . Hypertension Father   . Diabetes Father     Allergies and Medications: Allergies  Allergen Reactions  . Shellfish Allergy     swelling   No current facility-administered medications on file prior to encounter.   Current Outpatient Prescriptions on File Prior to Encounter  Medication Sig Dispense Refill  . albuterol (PROVENTIL HFA;VENTOLIN HFA) 108 (90 BASE) MCG/ACT inhaler Inhale 2 puffs into the lungs every 4 (four) hours as needed. Asthma attacks       . amLODipine (NORVASC) 5 MG tablet Take 1 tablet (5 mg total) by mouth daily.  90 tablet  1  . ferrous sulfate 325 (65 FE) MG tablet Take 1 tablet (325 mg total) by mouth daily.  30 tablet  6  . metFORMIN (GLUCOPHAGE) 500 MG tablet Take 1 tablet (500 mg total) by mouth 2 (two) times daily with a meal.  60 tablet  11    Objective: There were no vitals taken for this visit.  PHYSICAL EXAM: GENERAL: Adult AA  female. In no discomfort; no respiratory distress  PSYCH: alert and appropriate, good insight; emotional given desires to become pregnant  HNEENT:   CARDIO:  tachycardic but regular, S1/S2 heard, no murmur  LUNGS: CTA B, no wheezes, no crackles  ABDOMEN: +BS, soft, non-tender, no rigidity, no guarding, no masses/hepatosplenomegaly  EXTREM:    GU: Chaperone: CMA - Jo  External: no skin lesions, normal appearing genitalia, significant blood  Vagina: no vaginal lesion, blood clots present in vaginal canal   Cervix: no cervical lesions  Uterus:  deferred bimanual exam   [employer]:   TESTS:       SKIN:     Labs and Imaging: CBC BMET   Recent Labs Lab 07/22/13 1101  HGB 6.2*   No results found for this basename: NA, K, CL, CO2, BUN, CREATININE, GLUCOSE, CALCIUM,  in the last 168 hours   Urine pregnancy: Negative  Tammy Mews, DO 07/22/2013, 1:20 PM PGY-3, [facility] FPTS Intern pager: [phone], text pages welcome

## 2013-07-22 NOTE — H&P (Signed)
FMTS Attending Admission Note: Renold Don MD Personal pager:  [phone] FPTS Service Pager:  [phone]  I  have seen and examined this patient, reviewed their chart. I have discussed this patient with the resident. I agree with the resident's findings, assessment and care plan.  Additionally:  - 37 yo F with known menometrorrhagia and baseline Hgb about 10.  Has started bleeding again for several days.  POC Hgb has decreased to 6.8 here in clinic.  Now with some dyspnea on exertion.  No chest pain.  Some orthostasis at home.  Exam reveals comfortable appearing AAF sitting in chair.  Lungs good.  Well-hydrated.  No current orthostasis with standing here in clinic.  Plan is to admit her for transfusion and observation.  Recommend Gyn consult.   Tobey Grim, MD 07/22/2013 3:06 PM

## 2013-07-22 NOTE — Progress Notes (Signed)
  [name] - 37 y.o. female MRN [id number]  Date of birth: 06/29/1976  CC, HPI, Interval History & ROS  Telisa is here today to followup on her chronic medical conditions including:  Vaginal Bleeding, Chest Pain, Dyspnea, Fatigue   PT TO BE ADMITTED TO FMTS.  Please see H&P

## 2013-07-23 ENCOUNTER — Other Ambulatory Visit: Payer: Self-pay

## 2013-07-23 DIAGNOSIS — E282 Polycystic ovarian syndrome: Secondary | ICD-10-CM

## 2013-07-23 LAB — TYPE AND SCREEN
Antibody Screen: NEGATIVE
Unit division: 0

## 2013-07-23 LAB — TROPONIN I: Troponin I: <0.3 ng/mL (ref <0.30)

## 2013-07-23 LAB — CBC
HCT: 27.6 % — ABNORMAL LOW (ref 36.0–46.0)
MCH: 21.7 pg — ABNORMAL LOW (ref 26.0–34.0)
MCV: 70.6 fL — ABNORMAL LOW (ref 78.0–100.0)
WBC: 10.8 10*3/uL — ABNORMAL HIGH (ref 4.0–10.5)

## 2013-07-23 MED ORDER — FERROUS SULFATE 325 (65 FE) MG PO TABS
325.0000 mg | ORAL_TABLET | Freq: Two times a day (BID) | ORAL | Status: DC
  Administered 2013-07-23 – 2013-07-24 (×3): 325 mg via ORAL
  Filled 2013-07-23 (×5): qty 1

## 2013-07-23 MED ORDER — ONDANSETRON 4 MG PO TBDP
4.0000 mg | ORAL_TABLET | ORAL | Status: DC | PRN
  Administered 2013-07-23: 4 mg via ORAL
  Filled 2013-07-23 (×2): qty 1

## 2013-07-23 MED ORDER — ONDANSETRON 4 MG PO TBDP
4.0000 mg | ORAL_TABLET | Freq: Once | ORAL | Status: AC
  Filled 2013-07-23: qty 1

## 2013-07-23 NOTE — Progress Notes (Signed)
Utilization review completed. Ronika Kelson, RN, BSN. 

## 2013-07-23 NOTE — Progress Notes (Signed)
Family Medicine Teaching Service Daily Progress Note Intern Pager: [phone]  Patient name: [name] Medical record number: [id number] Date of birth: 1976-04-02 Age: 37 y.o. Gender: female  Primary Care Provider: Gaspar Bidding, DO Consultants: none Code Status: full  Pt Overview and Major Events to Date:  12/29: transfused 2 u pRBCs  Assessment and Plan: [name] is a 37 y.o. year old female found to have somatic anemia due to menometrorrhagia.Marland Kitchen PMH is significant for PCO S., known fibroids, desire for pregnancy.  # Anemia: patient with hgb to 6.6 on admission most likely related to menometrorrhagia. Received 2 u pRBCs and had increase in Hgb to 8.5. She has had improved shortness of breath and absence of chest pain since admission. -will continue to monitor CBC daily, would like this to be proven to be stable prior to discharge -will start iron supplementation given microcytic nature of anemia  # Menometrorrhagia: Significant bleeding in clinic on exam and over the past 2 months. Using a pad every 2-3 hours. Continues to have bleeding today. Transvaginal US without obvious cause for this bleeding. May be related to PCOS, though need to consider other causes. -will continue treatment with high dose OCPs qid for up to 7 days or until bleeding stops -zofran for any nausea associated with this treatment -will order PT/INR and PTT as initial screening for coagulopathy, with potential for further work up based on results as an outpatient -will need outpatient f/u at [facility] after discharge   # Chest tightness, dyspnea on exertion, fatigue: Likely resulting from symptomatic anemia. Patient with unchanged EKG and troponin negative x2. All of this is improved following transfusion. -will continue to monitor for recurrence.  FEN/GI: regular diet PPx: SCDs, no heparin due to active bleed  Disposition: discharge pending stabilization of hgb and decreased vaginal bleeding  Subjective:  patient states she is feeling better. She is still bleeding. She denies chest pain and states she had a little bit of dyspnea when walking to the bathroom.  Objective: Temp:  [98 F (36.7 C)-99.4 F (37.4 C)] 98.4 F (36.9 C) (12/30 0458) Pulse Rate:  [98-120] 98 (12/30 0458) Resp:  [16-20] 18 (12/30 0458) BP: (121-146)/(70-97) 137/92 mmHg (12/30 0458) SpO2:  [99 %-100 %] 99 % (12/30 0458) Weight:  [214 lb (97.07 kg)-219 lb 12.8 oz (99.701 kg)] 219 lb 12.8 oz (99.701 kg) (12/29 1346) Physical Exam: General: NAD, resting comfortably in bed Cardiovascular: tachycardic, no murmurs noted Respiratory: CTAB, no wheezes or crackles Abdomen: s, NT, ND Extremities: no edema  Laboratory:  Recent Labs Lab 07/22/13 1101 07/22/13 1305 07/23/13 0330  WBC  --  10.0 10.8*  HGB 6.2* 6.6* 8.5*  HCT  --  23.2* 27.6*  PLT  --  389 336    Recent Labs Lab 07/22/13 1305  NA 138  K 3.7  CL 103  CO2 25  BUN 11  CREATININE 0.66  CALCIUM 8.7  PROT 7.8  BILITOT 0.2*  ALKPHOS 118*  ALT 6  AST 12  GLUCOSE 123*    Results for orders placed during the hospital encounter of 07/22/13 (from the past 24 hour(s))  PREPARE RBC (CROSSMATCH)     Status: None   Collection Time    07/22/13  1:00 PM      Result Value Range   Order Confirmation ORDER PROCESSED BY BLOOD BANK    CBC     Status: Abnormal   Collection Time    07/22/13  1:05 PM  Result Value Range   WBC 10.0  4.0 - 10.5 K/uL   RBC 3.52 (*) 3.87 - 5.11 MIL/uL   Hemoglobin 6.6 (*) 12.0 - 15.0 g/dL   HCT 16.1 (*) 09.6 - 04.5 %   MCV 65.9 (*) 78.0 - 100.0 fL   MCH 18.8 (*) 26.0 - 34.0 pg   MCHC 28.4 (*) 30.0 - 36.0 g/dL   RDW 40.9 (*) 81.1 - 91.4 %   Platelets 389  150 - 400 K/uL  COMPREHENSIVE METABOLIC PANEL     Status: Abnormal   Collection Time    07/22/13  1:05 PM      Result Value Range   Sodium 138  135 - 145 mEq/L   Potassium 3.7  3.5 - 5.1 mEq/L   Chloride 103  96 - 112 mEq/L   CO2 25  19 - 32 mEq/L   Glucose,  Bld 123 (*) 70 - 99 mg/dL   BUN 11  6 - 23 mg/dL   Creatinine, Ser 7.82  0.50 - 1.10 mg/dL   Calcium 8.7  8.4 - 95.6 mg/dL   Total Protein 7.8  6.0 - 8.3 g/dL   Albumin 3.2 (*) 3.5 - 5.2 g/dL   AST 12  0 - 37 U/L   ALT 6  0 - 35 U/L   Alkaline Phosphatase 118 (*) 39 - 117 U/L   Total Bilirubin 0.2 (*) 0.3 - 1.2 mg/dL   GFR calc non Af Amer >90  >90 mL/min   GFR calc Af Amer >90  >90 mL/min  TROPONIN I     Status: None   Collection Time    07/22/13  1:05 PM      Result Value Range   Troponin I <0.30  <0.30 ng/mL  TSH     Status: None   Collection Time    07/22/13  1:05 PM      Result Value Range   TSH 2.860  0.350 - 4.500 uIU/mL  TYPE AND SCREEN     Status: None   Collection Time    07/22/13  2:51 PM      Result Value Range   ABO/RH(D) O POS     Antibody Screen NEG     Sample Expiration 07/25/2013     Unit Number [id number]     Blood Component Type RED CELLS,LR     Unit division 00     Status of Unit ISSUED,FINAL     Transfusion Status OK TO TRANSFUSE     Crossmatch Result Compatible     Unit Number [id number]     Blood Component Type RED CELLS,LR     Unit division 00     Status of Unit ISSUED,FINAL     Transfusion Status OK TO TRANSFUSE     Crossmatch Result Compatible    ABO/RH     Status: None   Collection Time    07/22/13  2:51 PM      Result Value Range   ABO/RH(D) O POS    CBC     Status: Abnormal   Collection Time    07/23/13  3:30 AM      Result Value Range   WBC 10.8 (*) 4.0 - 10.5 K/uL   RBC 3.91  3.87 - 5.11 MIL/uL   Hemoglobin 8.5 (*) 12.0 - 15.0 g/dL   HCT 02.7 (*) 25.3 - 66.4 %   MCV 70.6 (*) 78.0 - 100.0 fL   MCH 21.7 (*) 26.0 - 34.0 pg   MCHC  30.8  30.0 - 36.0 g/dL   RDW 21.3 (*) 08.6 - 57.8 %   Platelets 336  150 - 400 K/uL  TROPONIN I     Status: None   Collection Time    07/23/13  5:15 AM      Result Value Range   Troponin I <0.30  <0.30 ng/mL     Imaging/Diagnostic Tests: US Transvaginal Non-ob  07/22/2013   IMPRESSION: 1.   No acute process or explanation for patient's symptoms. 2. Cystic right adnexal structure is not significantly changed and remains suspicious for hydrosalpinx. 3. Complex right ovarian follicle.   Electronically Signed   By: Jeronimo Greaves M.D.   On: 07/22/2013 18:43   Glori Luis, MD 07/23/2013, 8:54 AM PGY-2, [facility] FPTS Intern pager: [phone], text pages welcome

## 2013-07-23 NOTE — Progress Notes (Signed)
FMTS Attending  Note: Shacara Cozine,MD I  have seen and examined this patient, reviewed their chart. I have discussed this patient with the resident. I agree with the resident's findings, assessment and care plan.  

## 2013-07-24 DIAGNOSIS — N7013 Chronic salpingitis and oophoritis: Secondary | ICD-10-CM

## 2013-07-24 LAB — CBC
HCT: 28.3 % — ABNORMAL LOW (ref 36.0–46.0)
Hemoglobin: 8.7 g/dL — ABNORMAL LOW (ref 12.0–15.0)
MCV: 70.4 fL — ABNORMAL LOW (ref 78.0–100.0)
RBC: 4.02 MIL/uL (ref 3.87–5.11)
WBC: 12.2 10*3/uL — ABNORMAL HIGH (ref 4.0–10.5)

## 2013-07-24 LAB — APTT: aPTT: 25 seconds (ref 24–37)

## 2013-07-24 LAB — PROTIME-INR
INR: 1.02 (ref 0.00–1.49)
Prothrombin Time: 13.2 seconds (ref 11.6–15.2)

## 2013-07-24 MED ORDER — NORETHINDRONE-ETH ESTRADIOL 0.4-35 MG-MCG PO TABS: 1.0000 | ORAL_TABLET | Freq: Three times a day (TID) | ORAL | Status: DC

## 2013-07-24 NOTE — Progress Notes (Signed)
Family Medicine Teaching Service Daily Progress Note Intern Pager: [phone]  Patient name: [name] Medical record number: [id number] Date of birth: May 01, 1976 Age: 37 y.o. Gender: female  Primary Care Provider: Gaspar Bidding, DO Consultants: none Code Status: full  Pt Overview and Major Events to Date:  12/29: transfused 2 u pRBCs 12/30: Hgb 6.6 > 8.5 12/31: Hgb 8.5 > 8.7  Assessment and Plan: [name] is a 37 y.o. year old female found to have somatic anemia due to menometrorrhagia. PMH is significant for PCOS, uterine fibroids, desire for pregnancy.  # Anemia, microcytic: patient with hgb to 6.6 on admission most likely related to menometrorrhagia. Received 2 u pRBCs and had increase in Hgb to 8.5. She has had improved shortness of breath and absence of chest pain since admission. - Hgb 8.7 today, suggesting blood count stability.  -CBC daily -Continue Iron -PT/INR wnl (13.2/1.02)  # Menometrorrhagia: Significant bleeding in clinic on exam and over the past 2 months. Using a pad every 2-3 hours. Continues to have bleeding today. Transvaginal US without obvious cause for this bleeding. May be related to PCOS, though need to consider other causes. -Bleeding and symptoms improving.  -Tapering high dose OCPs QID > TID; for up to 7 days or until bleeding stops -zofran for any nausea associated with this treatment -will need outpatient f/u at [facility]  # Chest tightness, dyspnea on exertion, fatigue: Likely resulting from symptomatic anemia. Patient with unchanged EKG and troponin negative x2. All of this is improved following transfusion. -will continue to monitor for recurrence.  FEN/GI: regular diet PPx: SCDs, no heparin due to active bleed  Disposition: discharge, given decreased vaginal bleeding with OCP taper  Subjective: Pt reports no pain or complaints. She is still bleeding, though less than before. Unable to quantify. No CP/light-headedness. Improved SOB.    Objective: Temp:  [98.2 F (36.8 C)-98.6 F (37 C)] 98.2 F (36.8 C) (12/31 0454) Pulse Rate:  [92-102] 102 (12/31 0454) Resp:  [20] 20 (12/31 0454) BP: (108-152)/(69-100) 108/69 mmHg (12/31 0454) SpO2:  [99 %-100 %] 100 % (12/31 0454) Physical Exam: General: 37yo female in NAD, resting comfortably in bed Cardiovascular: RRR, no murmurs noted Respiratory: CTAB, no wheezes or crackles Abdomen: Soft, NT, ND, NABS, No organomegaly Extremities: no edema, full ROM  Laboratory:  Recent Labs Lab 07/22/13 1305 07/23/13 0330 07/24/13 0550  WBC 10.0 10.8* 12.2*  HGB 6.6* 8.5* 8.7*  HCT 23.2* 27.6* 28.3*  PLT 389 336 358    Recent Labs Lab 07/22/13 1305  NA 138  K 3.7  CL 103  CO2 25  BUN 11  CREATININE 0.66  CALCIUM 8.7  PROT 7.8  BILITOT 0.2*  ALKPHOS 118*  ALT 6  AST 12  GLUCOSE 123*    Results for orders placed during the hospital encounter of 07/22/13 (from the past 24 hour(s))  CBC     Status: Abnormal   Collection Time    07/24/13  5:50 AM      Result Value Range   WBC 12.2 (*) 4.0 - 10.5 K/uL   RBC 4.02  3.87 - 5.11 MIL/uL   Hemoglobin 8.7 (*) 12.0 - 15.0 g/dL   HCT 14.7 (*) 82.9 - 56.2 %   MCV 70.4 (*) 78.0 - 100.0 fL   MCH 21.6 (*) 26.0 - 34.0 pg   MCHC 30.7  30.0 - 36.0 g/dL   RDW 13.0 (*) 86.5 - 78.4 %   Platelets 358  150 - 400 K/uL  PROTIME-INR     Status: None   Collection Time    07/24/13  5:50 AM      Result Value Range   Prothrombin Time 13.2  11.6 - 15.2 seconds   INR 1.02  0.00 - 1.49  APTT     Status: None   Collection Time    07/24/13  5:50 AM      Result Value Range   aPTT 25  24 - 37 seconds     Imaging/Diagnostic Tests: TRANSABDOMINAL AND TRANSVAGINAL ULTRASOUND OF PELVIS   COMPARISON: 04/29/2012 and CT of 05/03/2011.  FINDINGS:  Uterus: Retroflexed. 10.6 x 5.4 x 4.7 cm. Normal in morphology.  Endometrium: Normal, 1.0 cm.  Right Ovary: 3.2 x 2.1 x 2.0 cm. Complex follicle which measures 1.3  cm, including on image  70. Paraovarian cystic structure remains  suspicious for hydrosalpinx. This measures 2.2 x 0.8 cm on image 55.  Similar to 2.1 x 1.0 on the prior.  Left Ovary: 3.3 x 2.1 x 4.6 cm. Only identified transabdominally.  Normal in morphology.  Other Findings: No significant free fluid.   IMPRESSION:  1. No acute process or explanation for patient's symptoms.  2. Cystic right adnexal structure is not significantly changed and  remains suspicious for hydrosalpinx.  3. Complex right ovarian follicle.   Tammy Junker, MD 07/24/2013, 6:39 AM PGY-1, [place] [facility] FPTS Intern pager: [phone], text pages welcome

## 2013-07-24 NOTE — Discharge Summary (Signed)
Family Medicine Teaching [facility] Discharge Summary  Patient name: [name] Medical record number: [id number] Date of birth: [birth year] Age: 37 y.o. Gender: female Date of Admission: 07/22/2013  Date of Discharge: 07/24/2013 Admitting Physician: Janit Pagan, MD  Primary Care Provider: Gaspar Bidding, DO Consultants: None  Indication for Hospitalization: Symptomatic anemia due to menometrorrhagia   Discharge Diagnoses/Problem List:  Patient Active Problem List   Diagnosis Date Noted  . Anemia 07/22/2013  . Missed period 04/05/2013  . Anemia due to blood loss, chronic 06/27/2012  . Allergic rhinitis 06/27/2012  . Major depressive disorder 02/08/2012  . Hypertension 12/04/2011  . Dysmenorrhea 11/22/2011  . Yeast infection of the vagina 05/19/2011  . Diabetes mellitus, type 2 05/19/2011  . PCOS (polycystic ovarian syndrome) 05/19/2011  . Hydrosalpinx 08/21/2009   Disposition: Discharged home  Discharge Condition: Improved, stable  Discharge Exam:  General: 37yo female in NAD, resting comfortably in bed  HEENT: MMM, no conjunctival pallor Cardiovascular: RRR, no murmurs noted  Respiratory: CTAB, no wheezes or crackles  Abdomen: Soft, NT, ND, NABS, No organomegaly  Extremities: no edema, full ROM  Brief Hospital Course:  [name] is a 37 year old female found to have symptomatic anemia due to menometrorrhagia. PMH is significant for PCOS, uterine fibroids, desire for pregnancy.   Hemoglobin was checked due to symptoms of chest tightness, shortness of breath, and fatigue in the setting of vaginal bleeding for 2 months (approximately 1 pad every 2-3 hours) and was found to be 6.2. This improved to 8.5 after a transfusion of 2 units PRBCs with concomitant improvement of her symptoms. This was found to be stable on recheck (8.7) the following day. Vaginal bleeding is thought to be due to PCOS and has diminished with high dose OCP. Abdominal and transvaginal  ultrasound found no evidence of the cause of the hemorrhage. She is to continue taking OCP with scheduled taper, as well as iron supplementation. She is now stable for discharge with follow up scheduled with the family practice center.   Issues for Follow Up:  - Needs follow up CBC to monitor anemia - Needs to be set up with [facility] clinic appointment. The clinics were closed at time of discharge.   Significant Procedures: Transfusion 2u PRBCs  Significant Labs and Imaging:   Recent Labs Lab 07/22/13 1305 07/23/13 0330 07/24/13 0550  WBC 10.0 10.8* 12.2*  HGB 6.6* 8.5* 8.7*  HCT 23.2* 27.6* 28.3*  PLT 389 336 358    Recent Labs Lab 07/22/13 1305  NA 138  K 3.7  CL 103  CO2 25  GLUCOSE 123*  BUN 11  CREATININE 0.66  CALCIUM 8.7  ALKPHOS 118*  AST 12  ALT 6  ALBUMIN 3.2*   TRANSABDOMINAL AND TRANSVAGINAL ULTRASOUND OF PELVIS  COMPARISON: 04/29/2012 and CT of 05/03/2011.  FINDINGS:  Uterus: Retroflexed. 10.6 x 5.4 x 4.7 cm. Normal in morphology.  Endometrium: Normal, 1.0 cm.  Right Ovary: 3.2 x 2.1 x 2.0 cm. Complex follicle which measures 1.3  cm, including on image 70. Paraovarian cystic structure remains  suspicious for hydrosalpinx. This measures 2.2 x 0.8 cm on image 55.  Similar to 2.1 x 1.0 on the prior.  Left Ovary: 3.3 x 2.1 x 4.6 cm. Only identified transabdominally.  Normal in morphology.  Other Findings: No significant free fluid.  IMPRESSION:  1. No acute process or explanation for patient's symptoms.  2. Cystic right adnexal structure is not significantly changed and  remains suspicious for hydrosalpinx.  3. Complex right ovarian follicle.   Results/Tests Pending at Time of Discharge: None  Discharge Medications:    Medication List         albuterol 108 (90 BASE) MCG/ACT inhaler  Commonly known as:  PROVENTIL HFA;VENTOLIN HFA  Inhale 2 puffs into the lungs every 4 (four) hours as needed. Asthma attacks     amLODipine 5 MG tablet  Commonly  known as:  NORVASC  Take 1 tablet (5 mg total) by mouth daily.     ferrous sulfate 325 (65 FE) MG tablet  Take 1 tablet (325 mg total) by mouth daily.     metFORMIN 500 MG tablet  Commonly known as:  GLUCOPHAGE  Take 1 tablet (500 mg total) by mouth 2 (two) times daily with a meal.     norethindrone-ethinyl estradiol 0.4-35 MG-MCG tablet  Commonly known as:  OVCON-35,BALZIVA,BRIELLYN  Take 1 tablet by mouth every 8 (eight) hours.       Discharge Instructions: Please refer to Patient Instructions section of EMR for full details.  Patient was counseled important signs and symptoms that should prompt return to medical care, changes in medications, dietary instructions, activity restrictions, and follow up appointments.   Follow-Up Appointments: Follow-up Information   Follow up with RIGBY, MICHAEL, DO On 07/29/2013. (3:15pm)    Specialty:  Family Medicine   Contact information:   [address] [place] [address] [phone]      Hazeline Junker, MD 07/24/2013, 1:39 PM PGY-1, [facility]

## 2013-07-24 NOTE — Discharge Summary (Signed)
FMTS Attending  Note: Tammy Maka,MD I  have seen and examined this patient, reviewed their chart. I have discussed this patient with the resident. I agree with the resident's findings, assessment and care plan.  

## 2013-07-24 NOTE — Progress Notes (Signed)
FMTS Attending  Note: Tammy Bartles,MD I  have seen and examined this patient, reviewed their chart. I have discussed this patient with the resident. I agree with the resident's findings, assessment and care plan.  

## 2013-07-29 ENCOUNTER — Inpatient Hospital Stay: Payer: Medicaid Other | Admitting: Sports Medicine

## 2013-08-07 ENCOUNTER — Telehealth: Payer: Self-pay | Admitting: Sports Medicine

## 2013-08-07 NOTE — Telephone Encounter (Signed)
Would like stronger birth control. Has started bleeding heavy again Please advise

## 2013-08-07 NOTE — Telephone Encounter (Signed)
Pt needs to make an appointment with [facility] to have this addressed again.  Please schedule an appointment if she feels like she is having symptoms of anemia again.  Otherwise she needs further evaluation and definitive treatment via gynecology.  Please call and inform

## 2013-08-09 ENCOUNTER — Encounter (HOSPITAL_COMMUNITY): Payer: Self-pay | Admitting: *Deleted

## 2013-08-09 ENCOUNTER — Inpatient Hospital Stay (HOSPITAL_COMMUNITY)
Admission: AD | Admit: 2013-08-09 | Discharge: 2013-08-09 | Disposition: A | Discharge status: Home / Self Care | Payer: Medicaid Other | Source: Ambulatory Visit | Attending: Obstetrics & Gynecology | Admitting: Obstetrics & Gynecology

## 2013-08-09 DIAGNOSIS — E282 Polycystic ovarian syndrome: Secondary | ICD-10-CM | POA: Insufficient documentation

## 2013-08-09 DIAGNOSIS — D649 Anemia, unspecified: Secondary | ICD-10-CM | POA: Insufficient documentation

## 2013-08-09 DIAGNOSIS — N938 Other specified abnormal uterine and vaginal bleeding: Secondary | ICD-10-CM | POA: Insufficient documentation

## 2013-08-09 DIAGNOSIS — L680 Hirsutism: Secondary | ICD-10-CM | POA: Insufficient documentation

## 2013-08-09 DIAGNOSIS — N949 Unspecified condition associated with female genital organs and menstrual cycle: Secondary | ICD-10-CM | POA: Insufficient documentation

## 2013-08-09 DIAGNOSIS — N920 Excessive and frequent menstruation with regular cycle: Secondary | ICD-10-CM | POA: Insufficient documentation

## 2013-08-09 LAB — URINALYSIS, ROUTINE W REFLEX MICROSCOPIC
BILIRUBIN URINE: NEGATIVE
Glucose, UA: NEGATIVE mg/dL
KETONES UR: 15 mg/dL — AB
Leukocytes, UA: NEGATIVE
NITRITE: NEGATIVE
Protein, ur: NEGATIVE mg/dL
Specific Gravity, Urine: >1.03 — ABNORMAL HIGH (ref 1.005–1.030)
UROBILINOGEN UA: 0.2 mg/dL (ref 0.0–1.0)
pH: 6 (ref 5.0–8.0)

## 2013-08-09 LAB — CBC
HCT: 28.7 % — ABNORMAL LOW (ref 36.0–46.0)
Hemoglobin: 8.9 g/dL — ABNORMAL LOW (ref 12.0–15.0)
MCH: 21.5 pg — AB (ref 26.0–34.0)
MCHC: 31 g/dL (ref 30.0–36.0)
MCV: 69.5 fL — AB (ref 78.0–100.0)
Platelets: 443 10*3/uL — ABNORMAL HIGH (ref 150–400)
RBC: 4.13 MIL/uL (ref 3.87–5.11)
RDW: 20.5 % — AB (ref 11.5–15.5)
WBC: 11.5 10*3/uL — ABNORMAL HIGH (ref 4.0–10.5)

## 2013-08-09 LAB — URINE MICROSCOPIC-ADD ON

## 2013-08-09 LAB — POCT PREGNANCY, URINE: Preg Test, Ur: NEGATIVE

## 2013-08-09 MED ORDER — MEGESTROL ACETATE 40 MG PO TABS
80.0000 mg | ORAL_TABLET | Freq: Once | ORAL | Status: AC
  Administered 2013-08-09: 80 mg via ORAL
  Filled 2013-08-09: qty 2

## 2013-08-09 MED ORDER — IBUPROFEN 600 MG PO TABS
600.0000 mg | ORAL_TABLET | Freq: Once | ORAL | Status: AC
  Administered 2013-08-09: 600 mg via ORAL
  Filled 2013-08-09: qty 1

## 2013-08-09 MED ORDER — MEGESTROL ACETATE 40 MG PO TABS: 40.0000 mg | ORAL_TABLET | Freq: Three times a day (TID) | ORAL | Status: DC

## 2013-08-09 MED ORDER — ZOLPIDEM TARTRATE 10 MG PO TABS: 10.0000 mg | ORAL_TABLET | Freq: Every evening | ORAL | Status: DC | PRN

## 2013-08-09 MED ORDER — KETOROLAC TROMETHAMINE 60 MG/2ML IM SOLN: 60.0000 mg | Freq: Once | INTRAMUSCULAR | Status: DC

## 2013-08-09 NOTE — Discharge Instructions (Signed)

## 2013-08-09 NOTE — MAU Note (Signed)
I had blood transfusion 1.5 to 2 wks ago due to a lot of vag bleeding. I'm on medicine to help the bleeding, I think is birth control. It slowed alittle after transfusion but then started back more. Have appt with Dr Paulla Fore on Tues but was afraid if i waited til Tues would need another blood transfusion. I've been going thru heavy bleeding for 7 yrs.

## 2013-08-09 NOTE — MAU Provider Note (Signed)
History     CSN: [id number]  Arrival date and time: 08/09/13 [id number]   First Provider Initiated Contact with Patient 08/09/13 2131      Chief Complaint  Patient presents with  . Vaginal Bleeding   HPI This is a 38 y.o. female who presents requesting definitive treatment for her longstanding menorrhagia. Has been seen on this service since 2007 for irregular bleeding, menorrhagia and PCOS.  She has had a strong clinical diagnosis of PCOS based on elevated Testosterone, hirsutism(which has increased), and bleeding patterns where she misses 4-6 months at a time interspersed with periods of heavy bleeding. Has required transfusion in past, including recently . Was treated by Dr Kalman Shan in clinic years ago with Metformin and Clomid. But she never took them consistently at all and never kept the temperature charts she was asked to.   She has been wanting to get pregnant since 2007.  Wants to do In Vitro or adoption.   She did have a Mirena IUD for a short time but states her uterus is Tshaped and the IUD came out. Does NOT want another one or an ablation. Does not want a hysterectomy.   Dr Paulla Fore at [facility] has been treating her with OCPs, unsuccessfully. He wrote that he wanted to refer her back to GYN clinic but pt states she does not like seeing multiple doctors (has seen 2) and does not like that they will not work around her work schedule for appointments.   States cannot sleep and wants a sleeping pill. Uses ibuprofen 400mg  for pain with some relief.   RN Note: I had blood transfusion 1.5 to 2 wks ago due to a lot of vag bleeding. I'm on medicine to help the bleeding, I think is birth control. It slowed alittle after transfusion but then started back more. Have appt with Dr Paulla Fore on Tues but was afraid if i waited til Tues would need another blood transfusion. I've been going thru heavy bleeding for 7 yrs.        OB History   Grav Para Term Preterm Abortions TAB SAB Ect Mult Living    2 2 2       2       Past Medical History  Diagnosis Date  . Asthma   . Hypertension   . Migraines   . Polycystic ovarian disease   . Diabetes mellitus   . Anemia   . Abnormal Pap smear   . Major depressive disorder 02/08/2012    Reports that 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      Past Surgical History  Procedure Laterality Date  . Cesarean section    . Cervix lesion destruction      Family History  Problem Relation Age of Onset  . Diabetes Mother   . Hypertension Mother   . Hypertension Father   . Diabetes Father     History  Substance Use Topics  . Smoking status: Never Smoker   . Smokeless tobacco: Not on file  . Alcohol Use: No    Allergies:  Allergies  Allergen Reactions  . Shellfish Allergy Swelling    Prescriptions prior to admission  Medication Sig Dispense Refill  . amLODipine (NORVASC) 5 MG tablet Take 1 tablet (5 mg total) by mouth daily.  90 tablet  1  . ferrous sulfate 325 (65 FE) MG tablet Take 1 tablet (325 mg total) by mouth daily.  30 tablet  6  . ibuprofen (ADVIL,MOTRIN) 200 MG tablet Take 400 mg by mouth every 6 (six) hours as needed.      . metFORMIN (GLUCOPHAGE) 500 MG tablet Take 1 tablet (500 mg total) by mouth 2 (two) times daily with a meal.  60 tablet  11  . Multiple Vitamin (MULTIVITAMIN WITH MINERALS) TABS tablet Take 1 tablet by mouth daily.      . norethindrone-ethinyl estradiol (OVCON-35,BALZIVA,BRIELLYN) 0.4-35 MG-MCG tablet Take 1 tablet by mouth every 8 (eight) hours.  1 Package  11  . albuterol (PROVENTIL HFA;VENTOLIN HFA) 108 (90 BASE) MCG/ACT inhaler Inhale 2 puffs into the lungs every 4 (four) hours as needed. Asthma attacks         Review of Systems  Constitutional: Positive for malaise/fatigue. Negative for fever and chills.  Gastrointestinal: Positive for abdominal pain. Negative for nausea, vomiting, diarrhea and constipation.  Genitourinary:       Heavy  bleeding   Neurological: Positive for dizziness and weakness. Negative for headaches.  Psychiatric/Behavioral: Positive for depression.   Physical Exam   Blood pressure 156/96, pulse 101, temperature 98.5 F (36.9 C), resp. rate 20, height 4\' 11"  (1.499 m), weight 99.066 kg (218 lb 6.4 oz).  Physical Exam  Constitutional: She is oriented to person, place, and time. She appears well-developed and well-nourished. No distress.  HENT:  Head: Normocephalic.  Cardiovascular: Normal rate.   Respiratory: Effort normal.  GI: Soft. There is no tenderness.  Genitourinary:  Refuses pelvic exam Just wants to see if she needs a transfusion  Musculoskeletal: Normal range of motion.  Neurological: She is alert and oriented to person, place, and time.  Skin: Skin is warm and dry.  Psychiatric: She has a normal mood and affect.    MAU Course  Procedures  MDM Results for orders placed during the hospital encounter of 08/09/13 (from the past 24 hour(s))  CBC     Status: Abnormal   Collection Time    08/09/13  8:12 PM      Result Value Range   WBC 11.5 (*) 4.0 - 10.5 K/uL   RBC 4.13  3.87 - 5.11 MIL/uL   Hemoglobin 8.9 (*) 12.0 - 15.0 g/dL   HCT 28.7 (*) 36.0 - 46.0 %   MCV 69.5 (*) 78.0 - 100.0 fL   MCH 21.5 (*) 26.0 - 34.0 pg   MCHC 31.0  30.0 - 36.0 g/dL   RDW 20.5 (*) 11.5 - 15.5 %   Platelets 443 (*) 150 - 400 K/uL  URINALYSIS, ROUTINE W REFLEX MICROSCOPIC     Status: Abnormal   Collection Time    08/09/13  8:31 PM      Result Value Range   Color, Urine ORANGE (*) YELLOW   APPearance CLEAR  CLEAR   Specific Gravity, Urine >1.030 (*) 1.005 - 1.030   pH 6.0  5.0 - 8.0   Glucose, UA NEGATIVE  NEGATIVE mg/dL   Hgb urine dipstick LARGE (*) NEGATIVE   Bilirubin Urine NEGATIVE  NEGATIVE   Ketones, ur 15 (*) NEGATIVE mg/dL   Protein, ur NEGATIVE  NEGATIVE mg/dL   Urobilinogen, UA 0.2  0.0 - 1.0 mg/dL   Nitrite NEGATIVE  NEGATIVE   Leukocytes, UA NEGATIVE  NEGATIVE  URINE  MICROSCOPIC-ADD ON     Status: Abnormal   Collection Time    08/09/13  8:31 PM      Result Value Range   Squamous Epithelial / LPF RARE  RARE   WBC, UA 0-2  <3  WBC/hpf   RBC / HPF TOO NUMEROUS TO COUNT  <3 RBC/hpf   Bacteria, UA FEW (*) RARE  POCT PREGNANCY, URINE     Status: None   Collection Time    08/09/13  8:39 PM      Result Value Range   Preg Test, Ur NEGATIVE  NEGATIVE   Past Hemoglobin Levels: Hemoglobin 12.0 - 15.0 g/dL  8.9 (L)    8.7 (L)    8.5 (L) CM   6.6 (LL) CM   6.2 (A) R, CM   9.1 (L)    8.1 (L      Assessment and Plan  A:  Dysfunctional Uterine Bleeding      PCOS      Anemia, stable (up 0.2 from last reading)  P:  Discussed options      Will try stopping OCPs and switch to Megace (d/w Dr Elonda Husky)       Take  (40 mg) three times per day time three days,  then take (40 mg) two times per day time three days,  then take (40 mg)once per day        Per Dr Elonda Husky, pt could stay on this long term.       Will follow up with Dr Paulla Fore per pt request.   Hansel Feinstein 08/09/2013, 9:31 PM

## 2013-08-13 ENCOUNTER — Ambulatory Visit: Payer: Medicaid Other | Admitting: Sports Medicine

## 2014-01-20 ENCOUNTER — Encounter: Payer: Self-pay | Admitting: Family Medicine

## 2014-01-20 ENCOUNTER — Ambulatory Visit (INDEPENDENT_AMBULATORY_CARE_PROVIDER_SITE_OTHER): Payer: Self-pay | Admitting: Family Medicine

## 2014-01-20 VITALS — BP 138/96 | HR 98 | Temp 98.0°F | Wt 220.0 lb

## 2014-01-20 DIAGNOSIS — D500 Iron deficiency anemia secondary to blood loss (chronic): Secondary | ICD-10-CM

## 2014-01-20 DIAGNOSIS — I10 Essential (primary) hypertension: Secondary | ICD-10-CM

## 2014-01-20 DIAGNOSIS — E119 Type 2 diabetes mellitus without complications: Secondary | ICD-10-CM

## 2014-01-20 LAB — POCT GLYCOSYLATED HEMOGLOBIN (HGB A1C): Hemoglobin A1C: 6.6

## 2014-01-20 MED ORDER — AMLODIPINE BESYLATE 5 MG PO TABS: 5.0000 mg | ORAL_TABLET | Freq: Every day | ORAL | Status: DC

## 2014-01-20 MED ORDER — METFORMIN HCL 500 MG PO TABS: 500.0000 mg | ORAL_TABLET | Freq: Two times a day (BID) | ORAL | Status: DC

## 2014-01-20 NOTE — Patient Instructions (Addendum)
Good to see you today!  Thanks for coming in.  I will call you if your tests are not good.  Otherwise I will send you a letter.  If you do not hear from me with in 2 weeks please call our office.     I would take the metformin twice daily and the amlopdine daily  I would recommend taking iron tablets twice daily and a multivitamin every day  Check your blood pressure whenever you can.  It should be < 140/90  Dr Lonny Prude will be your new PCP Make an appointment to see him in 3 months For your daughters call the front let them know you are family and to see Dr Lonny Prude

## 2014-01-20 NOTE — Assessment & Plan Note (Signed)
At goal.  She wold like to take metformin for possilbe weight loss so will continue

## 2014-01-20 NOTE — Assessment & Plan Note (Signed)
Not well controlled.  Restart amlodipine.  Monitor blood pressure at home

## 2014-01-20 NOTE — Progress Notes (Signed)
   Subjective:    Patient ID: [name], female    DOB: Dec 02, 1975, 38 y.o.   MRN: [id number]  HPI HYPERTENSION Disease Monitoring: Blood pressure range-not checking Chest pain, palpitations- no      Dyspnea- no Medications: Compliance- not taking amlodipine Lightheadedness,Syncope- no   Edema- no  DIABETES Disease Monitoring: Blood Sugar ranges-not checking Polyuria/phagia/dipsia- no      Visual problems- no Medications: Compliance- not taking metformin for 2 months Hypoglycemic symptoms- no  ANEMIA Lmp was 1 month ago.  Has had 2 normal without heavy bleeding.  No lightheadness or edema or bleeding.  Not taking iron  ROS See HPI above   PMH Smoking Status noted  Lab Review   Potassium  Date Value Ref Range Status  07/22/2013 3.7  3.5 - 5.1 mEq/L Final     Sodium  Date Value Ref Range Status  07/22/2013 138  135 - 145 mEq/L Final     Creat  Date Value Ref Range Status  04/19/2013 0.73  0.50 - 1.10 mg/dL Final     Creatinine, Ser  Date Value Ref Range Status  07/22/2013 0.66  0.50 - 1.10 mg/dL Final      Chief Complaint noted Review of Symptoms - see HPI PMH - Smoking status noted.      Review of Systems     Objective:   Physical Exam  Alert no acute distress Heart - Regular rate and rhythm.  No murmurs, gallops or rubs.    Lungs:  Normal respiratory effort, chest expands symmetrically. Lungs are clear to auscultation, no crackles or wheezes. Extremities:  No cyanosis, edema, or deformity noted with good range of motion of all major joints.         Assessment & Plan:

## 2014-01-20 NOTE — Assessment & Plan Note (Signed)
Unsure of control.  Check labs.  Restart iron.  Also start MVit given possible desire to be pregnant

## 2014-01-21 LAB — CBC
HEMATOCRIT: 35.2 % — AB (ref 36.0–46.0)
Hemoglobin: 10.4 g/dL — ABNORMAL LOW (ref 12.0–15.0)
MCH: 20.4 pg — ABNORMAL LOW (ref 26.0–34.0)
MCHC: 29.5 g/dL — AB (ref 30.0–36.0)
MCV: 69.2 fL — ABNORMAL LOW (ref 78.0–100.0)
Platelets: 394 10*3/uL (ref 150–400)
RBC: 5.09 MIL/uL (ref 3.87–5.11)
RDW: 19.9 % — AB (ref 11.5–15.5)
WBC: 10.1 10*3/uL (ref 4.0–10.5)

## 2014-01-21 LAB — FERRITIN: Ferritin: 7 ng/mL — ABNORMAL LOW (ref 10–291)

## 2014-01-21 LAB — LDL CHOLESTEROL, DIRECT: Direct LDL: 154 mg/dL — ABNORMAL HIGH

## 2014-01-23 ENCOUNTER — Encounter: Payer: Self-pay | Admitting: Family Medicine

## 2014-03-23 ENCOUNTER — Encounter (HOSPITAL_COMMUNITY): Payer: Self-pay | Admitting: Emergency Medicine

## 2014-03-23 ENCOUNTER — Emergency Department (HOSPITAL_COMMUNITY)
Admission: EM | Admit: 2014-03-23 | Discharge: 2014-03-23 | Disposition: A | Discharge status: Home / Self Care | Payer: Medicaid Other | Attending: Emergency Medicine | Admitting: Emergency Medicine

## 2014-03-23 DIAGNOSIS — J0100 Acute maxillary sinusitis, unspecified: Secondary | ICD-10-CM

## 2014-03-23 DIAGNOSIS — R5383 Other fatigue: Secondary | ICD-10-CM

## 2014-03-23 DIAGNOSIS — I10 Essential (primary) hypertension: Secondary | ICD-10-CM | POA: Insufficient documentation

## 2014-03-23 DIAGNOSIS — R5381 Other malaise: Secondary | ICD-10-CM | POA: Insufficient documentation

## 2014-03-23 DIAGNOSIS — Z8659 Personal history of other mental and behavioral disorders: Secondary | ICD-10-CM | POA: Insufficient documentation

## 2014-03-23 DIAGNOSIS — H9209 Otalgia, unspecified ear: Secondary | ICD-10-CM | POA: Insufficient documentation

## 2014-03-23 DIAGNOSIS — G43909 Migraine, unspecified, not intractable, without status migrainosus: Secondary | ICD-10-CM | POA: Insufficient documentation

## 2014-03-23 DIAGNOSIS — J029 Acute pharyngitis, unspecified: Secondary | ICD-10-CM

## 2014-03-23 DIAGNOSIS — J45909 Unspecified asthma, uncomplicated: Secondary | ICD-10-CM | POA: Insufficient documentation

## 2014-03-23 DIAGNOSIS — D649 Anemia, unspecified: Secondary | ICD-10-CM | POA: Insufficient documentation

## 2014-03-23 DIAGNOSIS — E119 Type 2 diabetes mellitus without complications: Secondary | ICD-10-CM | POA: Insufficient documentation

## 2014-03-23 DIAGNOSIS — Z79899 Other long term (current) drug therapy: Secondary | ICD-10-CM | POA: Insufficient documentation

## 2014-03-23 MED ORDER — FLUTICASONE PROPIONATE 50 MCG/ACT NA SUSP: 2.0000 | Freq: Every day | NASAL | Status: DC

## 2014-03-23 MED ORDER — KETOROLAC TROMETHAMINE 30 MG/ML IJ SOLN: 30.0000 mg | Freq: Once | INTRAMUSCULAR | Status: DC

## 2014-03-23 MED ORDER — AMOXICILLIN 500 MG PO CAPS: 500.0000 mg | ORAL_CAPSULE | Freq: Three times a day (TID) | ORAL | Status: DC

## 2014-03-23 MED ORDER — IBUPROFEN 800 MG PO TABS
800.0000 mg | ORAL_TABLET | Freq: Once | ORAL | Status: AC
  Administered 2014-03-23: 800 mg via ORAL
  Filled 2014-03-23: qty 1

## 2014-03-23 MED ORDER — GUAIFENESIN ER 600 MG PO TB12: 600.0000 mg | ORAL_TABLET | Freq: Two times a day (BID) | ORAL | Status: DC

## 2014-03-23 NOTE — ED Notes (Signed)
Patient is alert and oriented x3.  She was given DC instructions and follow up visit instructions.  Patient gave verbal understanding. She was DC ambulatory under her own power to home.  V/S stable.  He was not showing any signs of distress on DC 

## 2014-03-23 NOTE — ED Notes (Signed)
Pt arrived to the ED with a complaint of bilateral ear pain.  Pt is also complaining of a sore throat.  Pt has had these symptoms for a week.  Pt has been taking sleeping pills to be able to sleep.

## 2014-03-23 NOTE — ED Notes (Signed)
Productive cough, with yellow phlegm, sore throat and bilateral ear pain x 1 week. Pt is gaurding lt ear upon walking in the room. Says lt ear hurts worse than rt. Ear. Daughter's at bedside.

## 2014-03-23 NOTE — Discharge Instructions (Signed)
Please use the medications prescribed and followup with your doctor later this week.    Sinusitis Sinusitis is redness, soreness, and inflammation of the paranasal sinuses. Paranasal sinuses are air pockets within the bones of your face (beneath the eyes, the middle of the forehead, or above the eyes). In healthy paranasal sinuses, mucus is able to drain out, and air is able to circulate through them by way of your nose. However, when your paranasal sinuses are inflamed, mucus and air can become trapped. This can allow bacteria and other germs to grow and cause infection. Sinusitis can develop quickly and last only a short time (acute) or continue over a long period (chronic). Sinusitis that lasts for more than 12 weeks is considered chronic.  CAUSES  Causes of sinusitis include:  Allergies.  Structural abnormalities, such as displacement of the cartilage that separates your nostrils (deviated septum), which can decrease the air flow through your nose and sinuses and affect sinus drainage.  Functional abnormalities, such as when the small hairs (cilia) that line your sinuses and help remove mucus do not work properly or are not present. SIGNS AND SYMPTOMS  Symptoms of acute and chronic sinusitis are the same. The primary symptoms are pain and pressure around the affected sinuses. Other symptoms include:  Upper toothache.  Earache.  Headache.  Bad breath.  Decreased sense of smell and taste.  A cough, which worsens when you are lying flat.  Fatigue.  Fever.  Thick drainage from your nose, which often is green and may contain pus (purulent).  Swelling and warmth over the affected sinuses. DIAGNOSIS  Your health care provider will perform a physical exam. During the exam, your health care provider may:  Look in your nose for signs of abnormal growths in your nostrils (nasal polyps).  Tap over the affected sinus to check for signs of infection.  View the inside of your sinuses  (endoscopy) using an imaging device that has a light attached (endoscope). If your health care provider suspects that you have chronic sinusitis, one or more of the following tests may be recommended:  Allergy tests.  Nasal culture. A sample of mucus is taken from your nose, sent to a lab, and screened for bacteria.  Nasal cytology. A sample of mucus is taken from your nose and examined by your health care provider to determine if your sinusitis is related to an allergy. TREATMENT  Most cases of acute sinusitis are related to a viral infection and will resolve on their own within 10 days. Sometimes medicines are prescribed to help relieve symptoms (pain medicine, decongestants, nasal steroid sprays, or saline sprays).  However, for sinusitis related to a bacterial infection, your health care provider will prescribe antibiotic medicines. These are medicines that will help kill the bacteria causing the infection.  Rarely, sinusitis is caused by a fungal infection. In theses cases, your health care provider will prescribe antifungal medicine. For some cases of chronic sinusitis, surgery is needed. Generally, these are cases in which sinusitis recurs more than 3 times per year, despite other treatments. HOME CARE INSTRUCTIONS   Drink plenty of water. Water helps thin the mucus so your sinuses can drain more easily.  Use a humidifier.  Inhale steam 3 to 4 times a day (for example, sit in the bathroom with the shower running).  Apply a warm, moist washcloth to your face 3 to 4 times a day, or as directed by your health care provider.  Use saline nasal sprays to help moisten  and clean your sinuses.  Take medicines only as directed by your health care provider.  If you were prescribed either an antibiotic or antifungal medicine, finish it all even if you start to feel better. SEEK IMMEDIATE MEDICAL CARE IF:  You have increasing pain or severe headaches.  You have nausea, vomiting, or  drowsiness.  You have swelling around your face.  You have vision problems.  You have a stiff neck.  You have difficulty breathing. MAKE SURE YOU:   Understand these instructions.  Will watch your condition.  Will get help right away if you are not doing well or get worse. Document Released: 07/11/2005 Document Revised: 11/25/2013 Document Reviewed: 07/26/2011 [facility] Patient Information 2015 [place], [place]. This information is not intended to replace advice given to you by your health care provider. Make sure you discuss any questions you have with your health care provider.    Sore Throat A sore throat is pain, burning, irritation, or scratchiness of the throat. There is often pain or tenderness when swallowing or talking. A sore throat may be accompanied by other symptoms, such as coughing, sneezing, fever, and swollen neck glands. A sore throat is often the first sign of another sickness, such as a cold, flu, strep throat, or mononucleosis (commonly known as mono). Most sore throats go away without medical treatment. CAUSES  The most common causes of a sore throat include:  A viral infection, such as a cold, flu, or mono.  A bacterial infection, such as strep throat, tonsillitis, or whooping cough.  Seasonal allergies.  Dryness in the air.  Irritants, such as smoke or pollution.  Gastroesophageal reflux disease (GERD). HOME CARE INSTRUCTIONS   Only take over-the-counter medicines as directed by your caregiver.  Drink enough fluids to keep your urine clear or pale yellow.  Rest as needed.  Try using throat sprays, lozenges, or sucking on hard candy to ease any pain (if older than 4 years or as directed).  Sip warm liquids, such as broth, herbal tea, or warm water with honey to relieve pain temporarily. You may also eat or drink cold or frozen liquids such as frozen ice pops.  Gargle with salt water (mix 1 tsp salt with 8 oz of water).  Do not smoke and avoid  secondhand smoke.  Put a cool-mist humidifier in your bedroom at night to moisten the air. You can also turn on a hot shower and sit in the bathroom with the door closed for 5-10 minutes. SEEK IMMEDIATE MEDICAL CARE IF:  You have difficulty breathing.  You are unable to swallow fluids, soft foods, or your saliva.  You have increased swelling in the throat.  Your sore throat does not get better in 7 days.  You have nausea and vomiting.  You have a fever or persistent symptoms for more than 2-3 days.  You have a fever and your symptoms suddenly get worse. MAKE SURE YOU:   Understand these instructions.  Will watch your condition.  Will get help right away if you are not doing well or get worse. Document Released: 08/18/2004 Document Revised: 06/27/2012 Document Reviewed: 03/18/2012 [facility] Patient Information 2015 [place], [place]. This information is not intended to replace advice given to you by your health care provider. Make sure you discuss any questions you have with your health care provider.

## 2014-03-23 NOTE — ED Provider Notes (Signed)
CSN: [id number]     Arrival date & time 03/23/14  0023 History   First MD Initiated Contact with Patient 03/23/14 0112     Chief Complaint  Patient presents with  . Earache    HPI  History provided by the patient. Patient is a 38 year old female with history of hypertension, diabetes and asthma presenting with symptoms of persistent nasal congestion, sinus pressure, bilateral ear pain and sore throat. Symptoms first began one week ago and have been persistent. She has been taking some ibuprofen and over-the-counter medications without any improvement. Denies any associated cough or asthma. Denies any fever, chills or sweats. No other aggravating or alleviating factors. No recent travel or sick contacts.    Past Medical History  Diagnosis Date  . Asthma   . Hypertension   . Migraines   . Polycystic ovarian disease   . Diabetes mellitus   . Anemia   . Abnormal Pap smear   . Major depressive disorder 02/08/2012    Reports that 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     Past Surgical History  Procedure Laterality Date  . Cesarean section    . Cervix lesion destruction     Family History  Problem Relation Age of Onset  . Diabetes Mother   . Hypertension Mother   . Hypertension Father   . Diabetes Father    History  Substance Use Topics  . Smoking status: Never Smoker   . Smokeless tobacco: Not on file  . Alcohol Use: No   OB History   Grav Para Term Preterm Abortions TAB SAB Ect Mult Living   2 2 2       2      Review of Systems  Constitutional: Positive for fatigue. Negative for fever, chills and diaphoresis.  HENT: Positive for congestion, ear pain, sinus pressure and sore throat. Negative for ear discharge.   Respiratory: Positive for cough.   Gastrointestinal: Negative for vomiting and diarrhea.  Skin: Negative for rash.  All other systems reviewed and are negative.     Allergies  Shellfish  allergy  Home Medications   Prior to Admission medications   Medication Sig Start Date End Date Taking? Authorizing Provider  amLODipine (NORVASC) 5 MG tablet Take 1 tablet (5 mg total) by mouth daily. 01/20/14  Yes Lind Covert, MD  ferrous sulfate 325 (65 FE) MG tablet Take 1 tablet (325 mg total) by mouth daily. 06/27/12  Yes Gerda Diss, DO  ibuprofen (ADVIL,MOTRIN) 200 MG tablet Take 400 mg by mouth every 6 (six) hours as needed for mild pain.    Yes Historical Provider, MD  megestrol (MEGACE) 40 MG tablet Take 1 tablet (40 mg total) by mouth 3 (three) times daily. 08/09/13  Yes Seabron Spates, CNM  metFORMIN (GLUCOPHAGE) 500 MG tablet Take 1 tablet (500 mg total) by mouth 2 (two) times daily with a meal. 01/20/14  Yes Lind Covert, MD  Multiple Vitamin (MULTIVITAMIN WITH MINERALS) TABS tablet Take 1 tablet by mouth daily.   Yes Historical Provider, MD  albuterol (PROVENTIL HFA;VENTOLIN HFA) 108 (90 BASE) MCG/ACT inhaler Inhale 2 puffs into the lungs every 4 (four) hours as needed. Asthma attacks     Historical Provider, MD   BP 184/112  Pulse 116  Temp(Src) 98.4 F (36.9 C) (Oral)  Resp 16  SpO2 98% Physical Exam  Nursing note and vitals reviewed. Constitutional: She is oriented to person,  place, and time. She appears well-developed and well-nourished. No distress.  HENT:  Head: Normocephalic.  Mild exudate to the posterior pharynx. Slight erythema. Tonsils normal. And uvula midline.  Nasal mucosal edema and discharge.  Slight bulging of the right TM. No erythema.  Neck: Normal range of motion. Neck supple.  No meningeal signs  Cardiovascular: Normal rate and regular rhythm.   Pulmonary/Chest: Effort normal and breath sounds normal. No respiratory distress. She has no wheezes. She has no rales.  Abdominal: Soft.  Neurological: She is alert and oriented to person, place, and time.  Skin: Skin is warm and dry. No rash noted.  Psychiatric: She has a normal  mood and affect. Her behavior is normal.    ED Course  Procedures  COORDINATION OF CARE:  Nursing notes reviewed. Vital signs reviewed. Initial pt interview and examination performed.   Filed Vitals:   03/23/14 0027 03/23/14 0028  BP: 236/113 184/112  Pulse: 116   Temp: 98.4 F (36.9 C)   TempSrc: Oral   Resp: 16   SpO2: 98%     1:22 AM-seen and evaluated. She is well-appearing no acute distress. He is hypertensive initially but this improves at rest. She is on blood pressure medication.  Treatment plan initiated: Medications  ibuprofen (ADVIL,MOTRIN) tablet 800 mg (not administered)         MDM   Final diagnoses:  Acute maxillary sinusitis, recurrence not specified  Pharyngitis        Martie Lee, PA-C 03/23/14 0147

## 2014-03-23 NOTE — ED Provider Notes (Signed)
Medical screening examination/treatment/procedure(s) were performed by non-physician practitioner and as supervising physician I was immediately available for consultation/collaboration.   EKG Interpretation None        Wynetta Fines, MD 03/23/14 [phone]

## 2014-05-26 ENCOUNTER — Encounter (HOSPITAL_COMMUNITY): Payer: Self-pay | Admitting: Emergency Medicine

## 2014-12-15 ENCOUNTER — Other Ambulatory Visit: Payer: Self-pay | Admitting: Family Medicine

## 2014-12-15 NOTE — Telephone Encounter (Signed)
Pt is asking for a refill on her birth control, didn't know she was out, says if she doesn't get this today she will have to get another blood transfusion which she will refuse to do, pt goes to [facility], pt would like call back once done

## 2015-01-13 ENCOUNTER — Ambulatory Visit: Payer: Medicaid Other | Admitting: Family Medicine

## 2015-05-13 ENCOUNTER — Telehealth: Payer: Self-pay | Admitting: Family Medicine

## 2015-05-13 DIAGNOSIS — Z0289 Encounter for other administrative examinations: Secondary | ICD-10-CM

## 2015-05-13 NOTE — Telephone Encounter (Signed)
Patient states she needs a order for a sleepy study. 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 Please advise.

## 2015-05-15 ENCOUNTER — Encounter: Payer: Self-pay | Admitting: Family Medicine

## 2015-05-15 ENCOUNTER — Ambulatory Visit (INDEPENDENT_AMBULATORY_CARE_PROVIDER_SITE_OTHER): Payer: Self-pay | Admitting: Family Medicine

## 2015-05-15 VITALS — BP 150/108 | HR 98 | Temp 98.2°F | Ht 59.0 in | Wt 224.0 lb

## 2015-05-15 DIAGNOSIS — I10 Essential (primary) hypertension: Secondary | ICD-10-CM

## 2015-05-15 DIAGNOSIS — Z3200 Encounter for pregnancy test, result unknown: Secondary | ICD-10-CM

## 2015-05-15 DIAGNOSIS — E119 Type 2 diabetes mellitus without complications: Secondary | ICD-10-CM

## 2015-05-15 DIAGNOSIS — E282 Polycystic ovarian syndrome: Secondary | ICD-10-CM

## 2015-05-15 LAB — POCT URINE PREGNANCY: Preg Test, Ur: NEGATIVE

## 2015-05-15 LAB — POCT GLYCOSYLATED HEMOGLOBIN (HGB A1C): Hemoglobin A1C: 8.2

## 2015-05-15 MED ORDER — ACCU-CHEK AVIVA DEVI: Status: AC

## 2015-05-15 MED ORDER — AMLODIPINE BESYLATE 5 MG PO TABS: 5.0000 mg | ORAL_TABLET | Freq: Every day | ORAL | Status: DC

## 2015-05-15 MED ORDER — ACCU-CHEK SOFTCLIX LANCET DEV KIT: PACK | Status: DC

## 2015-05-15 MED ORDER — ACCU-CHEK SOFTCLIX LANCETS MISC: Status: DC

## 2015-05-15 MED ORDER — METFORMIN HCL 500 MG PO TABS: 500.0000 mg | ORAL_TABLET | Freq: Two times a day (BID) | ORAL | Status: DC

## 2015-05-15 MED ORDER — NORETHINDRONE-ETH ESTRADIOL 0.4-35 MG-MCG PO TABS: 1.0000 | ORAL_TABLET | Freq: Every day | ORAL | Status: DC

## 2015-05-15 MED ORDER — GLUCOSE BLOOD VI STRP
ORAL_STRIP | Status: DC
Start: 2015-05-15 — End: 2017-10-20

## 2015-05-15 NOTE — Progress Notes (Signed)
    Subjective    [name] is a 39 y.o. female that presents for a follow-up visit for chronic issues.   1. PCOS: Chronic. She was taking ortho-cyclin but ran out 6 months ago. Her bleeding was controlled on that medication and for a short time after, but has returned. She is currently sexually active with one partner with no condom use.  2. Hypertension: Chronic. She Has not been adherenth with amlodipine 74m. No chest pain or dyspnea.  3. Diabetes: Chronic. Patient has not been adherent with metformin. She has some nocturia but does not report polydipsia or polyuria.   Social History  Substance Use Topics  . Smoking status: Never Smoker   . Smokeless tobacco: None  . Alcohol Use: No    Allergies  Allergen Reactions  . Shellfish Allergy Swelling    No orders of the defined types were placed in this encounter.    ROS  Per HPI   Objective   BP 150/108 mmHg  Pulse 98  Temp(Src) 98.2 F (36.8 C) (Oral)  Ht _0  (1.499 m)  Wt 224 lb (101.606 kg)  BMI 45.22 kg/m2  LMP 05/01/2015  General: Well appearing, no distress  Assessment and Plan    Orders Placed This Encounter  Procedures  . POCT glycosylated hemoglobin (Hb A1C)  . POCT urine pregnancy   Meds ordered this encounter  Medications  . metFORMIN (GLUCOPHAGE) 500 MG tablet    Sig: Take 1 tablet (500 mg total) by mouth 2 (two) times daily with a meal.    Dispense:  120 tablet    Refill:  0  . amLODipine (NORVASC) 5 MG tablet    Sig: Take 1 tablet (5 mg total) by mouth daily.    Dispense:  90 tablet    Refill:  2  . Lancets Misc. (ACCU-CHEK SOFTCLIX LANCET DEV) KIT    Sig: Check fasting blood sugar daily in the morning    Dispense:  1 kit    Refill:  0  . ACCU-CHEK SOFTCLIX LANCETS lancets    Sig: Check fasting blood sugar daily in the morning    Dispense:  100 each    Refill:  12  . Blood Glucose Monitoring Suppl (ACCU-CHEK AVIVA) device    Sig: Check fasting blood sugar daily in the morning    Dispense:  1 each    Refill:  0  . glucose blood (ACCU-CHEK AVIVA) test strip    Sig: Check fasting blood sugar daily in the morning    Dispense:  100 each    Refill:  12  . norethindrone-ethinyl estradiol (OVCON-35,BALZIVA,BRIELLYN) 0.4-35 MG-MCG tablet    Sig: Take 1 tablet by mouth daily.    Dispense:  1 Package    Refill:  11    Diabetes mellitus, type 2 Patient not adherent with regimen. Previously on sub-treatment doses of Metformin. A1C up to 8.2. No symptoms. Will restart Metformin with plans to increase to 10024mBID. Ordering supplies as well for qAM blood sugar checks  Hypertension Not controlled and patient not adherent with regimen. Will refill amlodipine 30m73maily.  PCOS (polycystic ovarian syndrome) Patient previously with an issue with menorrhagia requiring hospitalization for severe anemia. Patient previously using oral contraception, but stopped a few months ago. Patient currently is not pregnant (confirmed with test). No history of DVT. Patient not a smoker. Will refill OCP

## 2015-05-15 NOTE — Patient Instructions (Signed)
Thank you for coming to see me today. It was a pleasure. Today we talked about:   Hypertension: I am restarting your Amlodipine 5mg  daily. Please make an appointment to see the nurse for a recheck in two weeks  Diabetes: We discussed this diagnosis. I am refilling your metformin. I am calling in a prescription for meter and supplies.  PCOS/bleeding: I am going to prescribe you a birth control pill once I confirm that you are not pregnant  Please make an appointment to see me in 3 months for follow-up, or sooner if we need  If you have any questions or concerns, please do not hesitate to call the office at [phone].  Sincerely,  Cordelia Poche, MD

## 2015-05-18 NOTE — Assessment & Plan Note (Signed)
Patient previously with an issue with menorrhagia requiring hospitalization for severe anemia. Patient previously using oral contraception, but stopped a few months ago. Patient currently is not pregnant (confirmed with test). No history of DVT. Patient not a smoker. Will refill OCP

## 2015-05-18 NOTE — Assessment & Plan Note (Signed)
Not controlled and patient not adherent with regimen. Will refill amlodipine 5mg  daily.

## 2015-05-18 NOTE — Assessment & Plan Note (Addendum)
Patient not adherent with regimen. Previously on sub-treatment doses of Metformin. A1C up to 8.2. No symptoms. Will restart Metformin with plans to increase to 1000mg  BID. Ordering supplies as well for qAM blood sugar checks

## 2015-05-27 ENCOUNTER — Telehealth: Payer: Self-pay | Admitting: Family Medicine

## 2015-05-27 NOTE — Telephone Encounter (Signed)
Cannot prescribe medication for when unsure of etiology of symptoms. Patient will need to be seen for new medication like that. If related to side effects, we can discuss this. Please inform

## 2015-05-27 NOTE — Telephone Encounter (Signed)
Pt called and was hoping that the doctor can call in something for Nausea. Please let patient know so she can pick this up. jw

## 2015-05-28 NOTE — Telephone Encounter (Signed)
Spoke with pt, she will see Dr. Sherril Cong tomorrow. [name], [name]

## 2015-05-28 NOTE — Telephone Encounter (Signed)
Attempted to call, no answer.  Pt has RN visit tomorrow. [name], [name]

## 2015-05-29 ENCOUNTER — Ambulatory Visit (INDEPENDENT_AMBULATORY_CARE_PROVIDER_SITE_OTHER): Payer: Self-pay | Admitting: *Deleted

## 2015-05-29 ENCOUNTER — Ambulatory Visit: Payer: Medicaid Other | Admitting: Family Medicine

## 2015-05-29 DIAGNOSIS — I10 Essential (primary) hypertension: Secondary | ICD-10-CM

## 2015-05-29 NOTE — Progress Notes (Signed)
Pt presents today for a BP check.  She has been on amlodipine 5 mgs  x 2 weeks.  Her BP today was 160/100 pulse 78 @ 1103.  She denies CP or SOB.  Paged Dr. Lonny Prude.  Since pt is asymptomatic he wants her to increase her amlodipine from 5mg  - 10mg  daily and to come back in one week for a BP check.  Pt is aware and agreeable to plan and also agreeable to call us or EMS if she starts to have CP/SOB/Left arm pain etc. Appt made for 06/05/15.  Of note, pt is having diarrhea and nausea after restarting metformin.  Advised that this is a normal side effect that should eventually subside.  She will continue to try it until next BP check 06/05/15. Jaxtin Raimondo, Salome Spotted

## 2015-06-05 ENCOUNTER — Ambulatory Visit (INDEPENDENT_AMBULATORY_CARE_PROVIDER_SITE_OTHER): Payer: Self-pay | Admitting: *Deleted

## 2015-06-05 VITALS — BP 170/80 | HR 106

## 2015-06-05 DIAGNOSIS — Z0130 Encounter for examination of blood pressure without abnormal findings: Secondary | ICD-10-CM

## 2015-06-05 DIAGNOSIS — Z136 Encounter for screening for cardiovascular disorders: Secondary | ICD-10-CM

## 2015-06-05 DIAGNOSIS — I10 Essential (primary) hypertension: Secondary | ICD-10-CM

## 2015-06-05 NOTE — Progress Notes (Signed)
  Patient in nurse clinic for blood pressure check.  BP 170/0 manually left arm, heart rate 105.  BP recheck 160/80 manually. Patient stated she is taking the Norvasc 1 tab twice dailyof the 5 mg.  The medication is causing side effects.  She also stated that the metformin is causing her to have a lot nausea.  She is requesting something to help with nausea.  The Norvasc is causing her to be very irritable.   Patient does have SOB at times, denies today.  Denies any other symptoms.  Appointment with Dr. Valentina Lucks for hypertension and DM teaching.  Derl Barrow, RN

## 2015-06-09 ENCOUNTER — Ambulatory Visit (HOSPITAL_BASED_OUTPATIENT_CLINIC_OR_DEPARTMENT_OTHER): Payer: Medicaid Other | Attending: Family Medicine | Admitting: Radiology

## 2015-06-09 VITALS — Ht 59.0 in | Wt 225.0 lb

## 2015-06-09 DIAGNOSIS — R0683 Snoring: Secondary | ICD-10-CM | POA: Insufficient documentation

## 2015-06-09 DIAGNOSIS — G4736 Sleep related hypoventilation in conditions classified elsewhere: Secondary | ICD-10-CM | POA: Insufficient documentation

## 2015-06-09 DIAGNOSIS — Z0289 Encounter for other administrative examinations: Secondary | ICD-10-CM

## 2015-06-09 DIAGNOSIS — G4710 Hypersomnia, unspecified: Secondary | ICD-10-CM | POA: Insufficient documentation

## 2015-06-09 DIAGNOSIS — G4733 Obstructive sleep apnea (adult) (pediatric): Secondary | ICD-10-CM

## 2015-06-09 DIAGNOSIS — I493 Ventricular premature depolarization: Secondary | ICD-10-CM | POA: Insufficient documentation

## 2015-06-11 ENCOUNTER — Encounter: Payer: Self-pay | Admitting: Pharmacist

## 2015-06-11 ENCOUNTER — Ambulatory Visit (INDEPENDENT_AMBULATORY_CARE_PROVIDER_SITE_OTHER): Payer: Self-pay | Admitting: Pharmacist

## 2015-06-11 DIAGNOSIS — E119 Type 2 diabetes mellitus without complications: Secondary | ICD-10-CM

## 2015-06-11 DIAGNOSIS — I10 Essential (primary) hypertension: Secondary | ICD-10-CM

## 2015-06-11 LAB — LIPID PANEL
CHOL/HDL RATIO: 4.6 ratio (ref <=5.0)
Cholesterol: 198 mg/dL (ref 125–200)
HDL: 43 mg/dL — AB (ref >=46)
LDL Cholesterol: 134 mg/dL — ABNORMAL HIGH (ref <130)
Triglycerides: 104 mg/dL (ref <150)
VLDL: 21 mg/dL (ref <30)

## 2015-06-11 LAB — BASIC METABOLIC PANEL
BUN: 9 mg/dL (ref 7–25)
CALCIUM: 8.9 mg/dL (ref 8.6–10.2)
CO2: 24 mmol/L (ref 20–31)
Chloride: 102 mmol/L (ref 98–110)
Creat: 0.71 mg/dL (ref 0.50–1.10)
Glucose, Bld: 116 mg/dL — ABNORMAL HIGH (ref 65–99)
Potassium: 4.4 mmol/L (ref 3.5–5.3)
Sodium: 136 mmol/L (ref 135–146)

## 2015-06-11 MED ORDER — AMLODIPINE BESYLATE 10 MG PO TABS: 10.0000 mg | ORAL_TABLET | Freq: Every day | ORAL | Status: DC

## 2015-06-11 MED ORDER — GLIMEPIRIDE 1 MG PO TABS
1.0000 mg | ORAL_TABLET | Freq: Every day | ORAL | Status: DC
Start: 2015-06-11 — End: 2017-10-20

## 2015-06-11 MED ORDER — INDAPAMIDE 1.25 MG PO TABS: 1.2500 mg | ORAL_TABLET | Freq: Every day | ORAL | Status: DC

## 2015-06-11 MED ORDER — METFORMIN HCL ER 500 MG PO TB24: 500.0000 mg | ORAL_TABLET | Freq: Every day | ORAL | Status: DC

## 2015-06-11 NOTE — Assessment & Plan Note (Signed)
Hypertension:  Blood pressure currently uncontrolled.  Patient reports adherence with amlodipine 10mg  daily but had not taken it today.  Continued amlodipine 10mg  daily.  Prescription sent to [facility] due to $10 list.  Started indapamide 1.25mg  daily.

## 2015-06-11 NOTE — Assessment & Plan Note (Signed)
Diabetes currently uncontrolled.  Patient denies adherence with medication. Control is suboptimal due to intolerance of metformin and diet.  Switched metformin hcl to metformin XR 500mg  daily.  Started glimepiride 1mg  daily.  Counseled patient on purpose, proper use, and adverse effects of medications.  Discussed price of glucometer at [facility] (Relion glucometer $9 and test strips $9).  Encouraged patient to buy Relion glucometer and begin monitoring blood glucose every other day.  Counseled patient on diet and exercise and encouraged limiting sugary drinks and encouraged her to drink more water.  Next A1C anticipated 2 months.

## 2015-06-11 NOTE — Progress Notes (Signed)
Patient ID: [name], female   DOB: 11-18-1975, 39 y.o.   MRN: [id number] Reviewed: Agree with Dr. Graylin Shiver documentation and management.

## 2015-06-11 NOTE — Progress Notes (Signed)
S:    Patient arrives with her friend [place].  Patient appears apprehensive about appointment.  Presents for initial diabetes and hypertension evaluation.    Patient denies adherence with diabetes medications. Current diabetes medications include metformin 500 mg BID (taking one tablet every other day due to nausea).    Patient reports adherence with hypertension medications; however, patient has not taken her blood pressure medication today.  Current hypertension medications include amlodipine 10mg  daily.  Patient voices concern about amlodpine price of $45 at [employer].  Patient has medicaid family planning insurance but it does not cover prescriptions.  She is in the process of being switched back to regular [employer].  Until then, she reports she is paying out of pocket for all medications.    Patient reported dietary habits: She sometimes will skip meals. Has stopped drinking her sodas (was drinking soda 5 times/day) and now drinks sweet tea, fruit punch, pink lemonade, gatorade, and poweraid. Has worked on drinking more water.  Patient reported exercise habits: She walks with her friend Dawn around the mall.  Patient inquired about recent mood change and wants to know if it is due to any of her medications.      O:  Lab Results  Component Value Date   HGBA1C 8.2 05/15/2015    A/P: Diabetes currently uncontrolled.  Patient denies adherence with medication. Control is suboptimal due to intolerance of metformin and diet.  Switched metformin hcl to metformin XR 500mg  daily.  Started glimepiride 1mg  daily.  Counseled patient on purpose, proper use, and adverse effects of medications.  Discussed price of glucometer at [facility] (Relion glucometer $9 and test strips $9).  Encouraged patient to buy Relion glucometer and begin monitoring blood glucose every other day.  Counseled patient on diet and exercise and encouraged limiting sugary drinks and encouraged her to drink more water.  Next A1C  anticipated 2 months.    Hypertension:  Blood pressure currently uncontrolled.  Patient reports adherence with amlodipine 10mg  daily but had not taken it today.  Continued amlodipine 10mg  daily.  Prescription sent to [facility] due to $10 list.  Started indapamide 1.25mg  daily.      Written patient instructions provided.  Total time in face to face counseling 45 minutes.   Follow up in Pharmacist Clinic Visit on 06/23/15.   Patient seen with Liliane Shi, PharmD Candidate, Angela Burke, PharmD Resident.and Elisabeth Most, PharmD, Resident.

## 2015-06-11 NOTE — Patient Instructions (Signed)
Thank you for coming in today!   Keep taking amlodipine 10mg  daily.  Start taking indapamide 1.25mg  daily for blood pressure.   Stop taking your current metformin prescription.  Start taking metformin XR 500mg  daily and glimepiride 1mg  daily.    Pick up a Relion blood glucose meter from [place] and start checking blood sugar every other day.    Follow up with Dr. Valentina Lucks on 06/23/15.

## 2015-06-14 DIAGNOSIS — Z0289 Encounter for other administrative examinations: Secondary | ICD-10-CM

## 2015-06-14 DIAGNOSIS — G4733 Obstructive sleep apnea (adult) (pediatric): Secondary | ICD-10-CM

## 2015-06-14 NOTE — Progress Notes (Signed)
  Patient Name: [name], [name] Date: 06/09/2015 Gender: Female D.O.B: 06-27-1976 Age (years): 39 Referring Provider: Dorcas Mcmurray Height (inches): 25 Interpreting Physician: Baird Lyons MD, ABSM Weight (lbs): 220 RPSGT: Zadie Rhine BMI: 44 MRN: [id number] Neck Size: 18.00 CLINICAL INFORMATION Sleep Study Type: NPSG   Indication for sleep study: hypersomnia with sleep apnea   Epworth Sleepiness Score: 8   SLEEP STUDY TECHNIQUE As per the AASM Manual for the Scoring of Sleep and Associated Events v2.3 (April 2016) with a hypopnea requiring 4% desaturations. The channels recorded and monitored were frontal, central and occipital EEG, electrooculogram (EOG), submentalis EMG (chin), nasal and oral airflow, thoracic and abdominal wall motion, anterior tibialis EMG, snore microphone, electrocardiogram, and pulse oximetry.  MEDICATIONS Patient's medications include: charted for review. Medications self-administered by patient during sleep study : OTC sleep aid  SLEEP ARCHITECTURE The study was initiated at 9:43:35 PM and ended at 3:51:22 AM. Sleep onset time was 64.3 minutes and the sleep efficiency was 66.5%. The total sleep time was 244.5 minutes. Stage REM latency was 271.0 minutes. The patient spent 12.88% of the night in stage N1 sleep, 72.80% in stage N2 sleep, 1.64% in stage N3 and 12.68% in REM. Alpha intrusion was absent. Supine sleep was 22.32%. Wake after sleep onset 58.0 minutes  RESPIRATORY PARAMETERS The overall apnea/hypopnea index (AHI) was 81.2 per hour. There were 158 total apneas, including 158 obstructive, 0 central and 0 mixed apneas. There were 173 hypopneas and 85 RERAs. The AHI during Stage REM sleep was 127.7 per hour. AHI while supine was 97.8 per hour. The mean oxygen saturation was 93.28%. The minimum SpO2 during sleep was 68.00%. Moderate snoring was noted during this study.  CARDIAC DATA The 2 lead EKG demonstrated sinus rhythm. The mean  heart rate was 84.09 beats per minute. Other EKG findings include: PVCs.  LEG MOVEMENT DATA The total PLMS were 0 with a resulting PLMS index of 0.00. Associated arousal with leg movement index was 0.0 .  IMPRESSIONS - Severe obstructive sleep apnea occurred during this study (AHI = 81.2/h). - No significant central sleep apnea occurred during this study (CAI = 0.0/h). - Severe oxygen desaturation was noted during this study (Min O2 = 68.00%). - The patient snored with Moderate snoring volume. - EKG findings include PVCs. - Clinically significant periodic limb movements did not occur during sleep. No significant associated arousals.  DIAGNOSIS - Obstructive Sleep Apnea (327.23 [G47.33 ICD-10]) - Nocturnal Hypoxemia (327.26 [G47.36 ICD-10])  RECOMMENDATIONS - Therapeutic CPAP titration to determine optimal pressure required to alleviate sleep disordered breathing. - Positional therapy avoiding supine position during sleep. - Avoid alcohol, sedatives and other CNS depressants that may worsen sleep apnea and disrupt normal sleep architecture. - Sleep hygiene should be reviewed to assess factors that may improve sleep quality. - Weight management and regular exercise should be initiated or continued if appropriate.  Deneise Lever Diplomate, American Board of Sleep Medicine  ELECTRONICALLY SIGNED ON:  06/14/2015, 8:59 AM [place] PH: [phone]   FX: [phone] [place]

## 2015-06-17 ENCOUNTER — Telehealth: Payer: Self-pay | Admitting: Family Medicine

## 2015-06-17 NOTE — Telephone Encounter (Signed)
Pt needs sleep study results; she wants them by today. I informed patient that I couldn't make this promise. Patient states that she needs them for work and when she called the establishment where she had it conducted and she explains that they were very rude to her and told her to come here. Pt will likely be here after lunch. Thank you, Fonda Kinder, ASA

## 2015-06-17 NOTE — Telephone Encounter (Signed)
Patient will need to request these records from our office. They can be printed for and given to her.

## 2015-06-23 ENCOUNTER — Ambulatory Visit: Payer: Medicaid Other | Admitting: Pharmacist

## 2015-06-24 NOTE — Telephone Encounter (Signed)
Contacted pt and she said that she already has the below requested information.  She had some concerns with the results so we made an appointment for her to discuss with her PCP. Katharina Caper, Yaminah Clayborn D, [place]

## 2015-06-26 ENCOUNTER — Encounter: Payer: Self-pay | Admitting: Family Medicine

## 2015-06-29 ENCOUNTER — Encounter: Payer: Self-pay | Admitting: Pharmacist

## 2015-06-29 ENCOUNTER — Ambulatory Visit (INDEPENDENT_AMBULATORY_CARE_PROVIDER_SITE_OTHER): Payer: Self-pay | Admitting: Pharmacist

## 2015-06-29 VITALS — BP 181/111 | HR 101 | Wt 225.6 lb

## 2015-06-29 DIAGNOSIS — R0600 Dyspnea, unspecified: Secondary | ICD-10-CM

## 2015-06-29 DIAGNOSIS — R0602 Shortness of breath: Secondary | ICD-10-CM | POA: Insufficient documentation

## 2015-06-29 DIAGNOSIS — I10 Essential (primary) hypertension: Secondary | ICD-10-CM

## 2015-06-29 MED ORDER — INDAPAMIDE 2.5 MG PO TABS
2.5000 mg | ORAL_TABLET | Freq: Every day | ORAL | Status: DC
Start: 2015-06-29 — End: 2017-10-20

## 2015-06-29 MED ORDER — ALBUTEROL SULFATE HFA 108 (90 BASE) MCG/ACT IN AERS: 2.0000 | INHALATION_SPRAY | RESPIRATORY_TRACT | Status: DC | PRN

## 2015-06-29 MED ORDER — CLONIDINE HCL 0.1 MG PO TABS
0.1000 mg | ORAL_TABLET | Freq: Two times a day (BID) | ORAL | Status: DC
Start: 2015-06-29 — End: 2017-10-09

## 2015-06-29 NOTE — Progress Notes (Signed)
Patient ID: [name], female   DOB: November 05, 1975, 39 y.o.   MRN: [id number] Reviewed: Agree with Dr. Graylin Shiver documentation and management.

## 2015-06-29 NOTE — Assessment & Plan Note (Addendum)
History of longstanding hypertension found to have elevated readings in office this AM.   She had not taken medicine this AM however she reports adherence.  Changes to medications include increase of indapamide.  Also started clonidine (first dose in office 0.1 mg  Lot:  [id number]  Exp.  11/2016).   Spent time discussing lowering salt intake in diet (especially limiting intake of broth).   Reevaluate blood pressure at upcoming visit with PCP.  I would be happy to continue working to adjust this therapy PRN.   Please refer back to pharmacist clinic as needed to assist.   Use of ibuprofen is also likely cause of elevated BP readings.  Alternative pain control may improve blood pressure.

## 2015-06-29 NOTE — Patient Instructions (Signed)
Continue to take Amlodipine daily.   Increase your indapamide to 2.5 mg daily.  (take two of your old pills daily until gone).   A new prescription for this 2.5mg  dose has been sent to your pharmacy.   Start Clonidine 0.1 mg TWICE daily.   Take second dose for today this evening.      Please take all three of your medicines tomorrow AM prior to the visit.    Monitor salt in your diet by looking at the label.   Sodium amounts of 2000mg  daily will help your blood pressure.    Follow up tomorrow with Dr. Lonny Prude.

## 2015-06-29 NOTE — Assessment & Plan Note (Signed)
Reports use of albuterol multiple times per day.   Requested refill of albuterol.  Provided once.  Reassess need for albuterol use long-term at upcoming visit with PCP.

## 2015-06-29 NOTE — Progress Notes (Signed)
S:    Patient arrives in fair spirits however she notes that she has been feeling poorly for the past few days.  Presents to the clinic for blood pressure reevaluation.  Diagnosed with Hypertension many years ago.   Blood pressure medication compliance is reported to be excellent however patient has NOT taken medication prior to visit this AM.   She states that she takes this each morning with food after her first bus run.   Current BP Medications include:  Amlodipine 10mg  and indapamide 1.25mg  daily.   Dietary habits include:  Cooking with broth - discussed avoiding salt in excess of 2000mg  daily.   O:   Last 3 Office BP readings: BP Readings from Last 3 Encounters:  06/29/15 181/111  06/11/15 159/121  06/05/15 170/80   BMET    Component Value Date/Time   NA 136 06/11/2015 1058   K 4.4 06/11/2015 1058   CL 102 06/11/2015 1058   CO2 24 06/11/2015 1058   GLUCOSE 116* 06/11/2015 1058   BUN 9 06/11/2015 1058   CREATININE 0.71 06/11/2015 1058   CREATININE 0.66 07/22/2013 1305   CALCIUM 8.9 06/11/2015 1058   GFRNONAA >90 07/22/2013 1305   GFRAA >90 07/22/2013 1305    A/P: History of longstanding hypertension found to have elevated readings in office this AM.   She had not taken medicine this AM however she reports adherence.  Changes to medications include increase of indapamide.  Also started clonidine (first dose in office 0.1 mg  Lot:  [id number]  Exp.  11/2016).   Spent time discussing lowering salt intake in diet (especially limiting intake of broth).   Reevaluate blood pressure at upcoming visit with PCP.  I would be happy to continue working to adjust this therapy PRN.   Please refer back to pharmacist clinic as needed to assist.    Results reviewed and written information provided.  Total time in face-to-face counseling 20 minutes.   F/U Clinic Visit with Dr. Lonny Prude tomorrow.  Patient seen with Darvin Neighbours, PharmD Candidate.

## 2015-06-30 ENCOUNTER — Ambulatory Visit (INDEPENDENT_AMBULATORY_CARE_PROVIDER_SITE_OTHER): Payer: Self-pay | Admitting: Family Medicine

## 2015-06-30 ENCOUNTER — Encounter: Payer: Self-pay | Admitting: Family Medicine

## 2015-06-30 VITALS — BP 140/84 | HR 101 | Temp 98.3°F | Ht 59.0 in | Wt 228.0 lb

## 2015-06-30 DIAGNOSIS — G4733 Obstructive sleep apnea (adult) (pediatric): Secondary | ICD-10-CM | POA: Insufficient documentation

## 2015-06-30 DIAGNOSIS — I10 Essential (primary) hypertension: Secondary | ICD-10-CM

## 2015-06-30 DIAGNOSIS — E282 Polycystic ovarian syndrome: Secondary | ICD-10-CM

## 2015-06-30 HISTORY — DX: Obstructive sleep apnea (adult) (pediatric): G47.33

## 2015-06-30 MED ORDER — NORGESTREL-ETHINYL ESTRADIOL 0.5-50 MG-MCG PO TABS
1.0000 | ORAL_TABLET | Freq: Every day | ORAL | Status: DC
Start: 2015-06-30 — End: 2017-10-20

## 2015-06-30 NOTE — Progress Notes (Signed)
    Subjective    [name] is a 39 y.o. female that presents for a follow-up visit for chronic issues.   1. Vaginal bleeding: Her bleeding has remained constant. She has had multiple transfusions in the past for this. She reports having an endometrial biopsy in the past which was negative for malignancy. She is not wanting to be referred to an OB/Gyn.   2. Sleep study: Patient is unhappy because she felt like the sleep study was not accurate.   3. Hypertension: Patient is adherent with clonidine 0.1mg  BID and amlodipine 10mg  daily. She has no chest pain or dyspnea.  Social History  Substance Use Topics  . Smoking status: Never Smoker   . Smokeless tobacco: Never Used  . Alcohol Use: No    Allergies  Allergen Reactions  . Metformin And Related Diarrhea  . Shellfish Allergy Swelling    No orders of the defined types were placed in this encounter.    ROS  Per HPI   Objective   BP 140/84 mmHg  Pulse 101  Temp(Src) 98.3 F (36.8 C) (Oral)  Ht 4\' 11"  (1.499 m)  Wt 228 lb (103.42 kg)  BMI 46.03 kg/m2  LMP 06/30/2015  General: Well appearing, no distress  Assessment and Plan    PCOS (polycystic ovarian syndrome) Patient with a long history of management for this issue. She continues to decline management by an OB/Gyn. Discussed that my expertise on this issue only goes so far and insisted she see an OB/Gyn. She continues to decline referral. She is still having vaginal bleeding. Will increase dose of OCP. Patient states she has had an endometrial biopsy performed in the past that was normal.  Hypertension Blood pressure better controlled today. Continue current regimen. Follow-up at next visit.

## 2015-06-30 NOTE — Patient Instructions (Addendum)
Thank you for coming to see me today. It was a pleasure. Today we talked about:   Hypertension: I will not make any changes to your medication. Please try to limit your salt intake. We will discuss weight at another time.  PCOS: I have increased your birth control dosage.  Sleep study: I will refer you back for a sleep study with CPAP machine  Please make an appointment to see me in 2-3 months for follow-up of blood pressure, diabetes and PCOS.  If you have any questions or concerns, please do not hesitate to call the office at [phone].  Sincerely,  Cordelia Poche, MD

## 2015-06-30 NOTE — Assessment & Plan Note (Signed)
Blood pressure better controlled today. Continue current regimen. Follow-up at next visit.

## 2015-06-30 NOTE — Assessment & Plan Note (Addendum)
Patient with a long history of management for this issue. She continues to decline management by an OB/Gyn. Discussed that my expertise on this issue only goes so far and insisted she see an OB/Gyn. She continues to decline referral. She is still having vaginal bleeding. Will increase dose of OCP. Patient states she has had an endometrial biopsy performed in the past that was normal.

## 2015-07-27 ENCOUNTER — Ambulatory Visit (HOSPITAL_BASED_OUTPATIENT_CLINIC_OR_DEPARTMENT_OTHER): Payer: Medicaid Other | Attending: Family Medicine

## 2015-07-27 DIAGNOSIS — R0683 Snoring: Secondary | ICD-10-CM | POA: Insufficient documentation

## 2015-07-27 DIAGNOSIS — G4733 Obstructive sleep apnea (adult) (pediatric): Secondary | ICD-10-CM

## 2015-07-27 DIAGNOSIS — I493 Ventricular premature depolarization: Secondary | ICD-10-CM | POA: Insufficient documentation

## 2015-07-31 ENCOUNTER — Encounter (HOSPITAL_BASED_OUTPATIENT_CLINIC_OR_DEPARTMENT_OTHER): Payer: Self-pay | Admitting: *Deleted

## 2015-08-02 DIAGNOSIS — G4733 Obstructive sleep apnea (adult) (pediatric): Secondary | ICD-10-CM

## 2015-08-02 NOTE — Progress Notes (Signed)
        Patient Name: [name], Strebe Date: 07/27/2015 Gender: Female D.O.B: 03/03/1976 Age (years): 39 Referring Provider: Leeanne Rio Height (inches): 59 Interpreting Physician: Baird Lyons MD, ABSM Weight (lbs): 220 RPSGT: Joni Reining BMI: 3 MRN: [id number] Neck Size: 18.00 CLINICAL INFORMATION The patient is referred for a CPAP titration to treat sleep apnea.   Date of NPSG, Split Night or HST:  Diagnostic NPSG  06/09/15 AHI 81.2/ hr, desat to 68%, body weight 220 lbs SLEEP STUDY TECHNIQUE As per the AASM Manual for the Scoring of Sleep and Associated Events v2.3 (April 2016) with a hypopnea requiring 4% desaturations. The channels recorded and monitored were frontal, central and occipital EEG, electrooculogram (EOG), submentalis EMG (chin), nasal and oral airflow, thoracic and abdominal wall motion, anterior tibialis EMG, snore microphone, electrocardiogram, and pulse oximetry. Continuous positive airway pressure (CPAP) was initiated at the beginning of the study and titrated to treat sleep-disordered breathing.  MEDICATIONS Medications taken by the patient : charted for review Medications administered by patient during sleep study : No sleep medicine administered.  TECHNICIAN COMMENTS Comments added by technician: NONE  Comments added by scorer: N/A  RESPIRATORY PARAMETERS Optimal PAP Pressure (cm): 15 AHI at Optimal Pressure (/hr): 0.0 Overall Minimal O2 (%): 88.00 Supine % at Optimal Pressure (%): 0 Minimal O2 at Optimal Pressure (%): 94.0    SLEEP ARCHITECTURE The study was initiated at 10:46:34 PM and ended at 4:48:31 AM. Sleep onset time was 28.4 minutes and the sleep efficiency was 82.7%. The total sleep time was 299.5 minutes. The patient spent 1.17% of the night in stage N1 sleep, 47.75% in stage N2 sleep, 31.72% in stage N3 and 19.37% in REM.Stage REM latency was 22.5 minutes Wake after sleep onset was 34.0. Alpha intrusion was absent. Supine  sleep was 38.73%.  CARDIAC DATA The 2 lead EKG demonstrated sinus rhythm. The mean heart rate was 88.46 beats per minute. Other EKG findings include: PVCs.  LEG MOVEMENT DATA The total Periodic Limb Movements of Sleep (PLMS) were 0. The PLMS index was 0.00. A PLMS index of <15 is considered normal in adults.  IMPRESSIONS - The optimal PAP pressure was 15 cm of water. - Central sleep apnea was not noted during this titration (CAI = 0.2/h). - Mild oxygen desaturations were observed during this titration (min O2 = 88.00%). - The patient snored with Loud snoring volume during this titration study. - 2-lead EKG demonstrated: PVCs - Clinically significant periodic limb movements were not noted during this study. Arousals associated with PLMs were rare.  DIAGNOSIS - Obstructive Sleep Apnea (327.23 [G47.33 ICD-10])  RECOMMENDATIONS - Trial of CPAP therapy on 15 cm H2O with a Small size Resmed Nasal Pillow Mask AirFit P10 mask and heated humidification. - Avoid alcohol, sedatives and other CNS depressants that may worsen sleep apnea and disrupt normal sleep architecture. - Sleep hygiene should be reviewed to assess factors that may improve sleep quality. - Weight management and regular exercise should be initiated or continued.  Deneise Lever Diplomate, American Board of Sleep Medicine  ELECTRONICALLY SIGNED ON:  08/02/2015, 1:32 PM [place] PH: [phone]   FX: [phone] [place]

## 2015-08-07 ENCOUNTER — Telehealth: Payer: Self-pay | Admitting: Family Medicine

## 2015-08-07 NOTE — Telephone Encounter (Signed)
Pt is calling for the results of her second sleep study report done on 07/27/15. jw

## 2015-08-07 NOTE — Telephone Encounter (Signed)
Will forward to contact patient regarding results. Dollie Bressi,CMA

## 2015-08-10 NOTE — Telephone Encounter (Signed)
[place] and asked to fax over prescription with sleep study to [phone]. Prescription faxed around 2:30PM to this number.

## 2015-08-10 NOTE — Telephone Encounter (Signed)
Physician Telephone Note  Reason for call: Discuss results Verified patient's identity x2: Yes  Discussion with patient: Discussed results of sleep study. Patient states she will need me to get in touch with [facility] for her to get her CPAP machine.  Cordelia Poche, MD PGY-3, [facility] 08/10/2015, 2:01 PM

## 2015-08-11 NOTE — Telephone Encounter (Signed)
LVM for pt to call our office. If pt calls, please inform her that Dr. Lonny Prude has faxed over the prescription and sleep study information to [facility]. Ottis Stain, CMA

## 2015-08-18 NOTE — Telephone Encounter (Signed)
Faxed over referral info sheet to [facility] per Dr. Teryl Lucy request. Katharina Caper, Bianca Raneri D, [place]

## 2015-08-27 ENCOUNTER — Encounter (HOSPITAL_BASED_OUTPATIENT_CLINIC_OR_DEPARTMENT_OTHER): Payer: Self-pay | Admitting: *Deleted

## 2016-02-04 ENCOUNTER — Encounter (HOSPITAL_COMMUNITY): Payer: Self-pay | Admitting: Emergency Medicine

## 2016-02-04 ENCOUNTER — Emergency Department (HOSPITAL_COMMUNITY): Payer: Medicaid Other

## 2016-02-04 ENCOUNTER — Emergency Department (HOSPITAL_COMMUNITY)
Admission: EM | Admit: 2016-02-04 | Discharge: 2016-02-05 | Disposition: A | Discharge status: Home / Self Care | Payer: Medicaid Other | Attending: Emergency Medicine | Admitting: Emergency Medicine

## 2016-02-04 DIAGNOSIS — I10 Essential (primary) hypertension: Secondary | ICD-10-CM | POA: Diagnosis not present

## 2016-02-04 DIAGNOSIS — Z7984 Long term (current) use of oral hypoglycemic drugs: Secondary | ICD-10-CM | POA: Insufficient documentation

## 2016-02-04 DIAGNOSIS — F329 Major depressive disorder, single episode, unspecified: Secondary | ICD-10-CM | POA: Diagnosis not present

## 2016-02-04 DIAGNOSIS — R0789 Other chest pain: Secondary | ICD-10-CM | POA: Insufficient documentation

## 2016-02-04 DIAGNOSIS — Z79899 Other long term (current) drug therapy: Secondary | ICD-10-CM | POA: Diagnosis not present

## 2016-02-04 DIAGNOSIS — E119 Type 2 diabetes mellitus without complications: Secondary | ICD-10-CM | POA: Diagnosis not present

## 2016-02-04 DIAGNOSIS — J45909 Unspecified asthma, uncomplicated: Secondary | ICD-10-CM | POA: Diagnosis not present

## 2016-02-04 DIAGNOSIS — Z91013 Allergy to seafood: Secondary | ICD-10-CM | POA: Insufficient documentation

## 2016-02-04 LAB — D-DIMER, QUANTITATIVE: D-Dimer, Quant: 0.3 ug/mL-FEU (ref 0.00–0.50)

## 2016-02-04 LAB — BASIC METABOLIC PANEL
Anion gap: 8 (ref 5–15)
BUN: 9 mg/dL (ref 6–20)
CALCIUM: 9.1 mg/dL (ref 8.9–10.3)
CO2: 25 mmol/L (ref 22–32)
CREATININE: 0.68 mg/dL (ref 0.44–1.00)
Chloride: 101 mmol/L (ref 101–111)
GFR calc Af Amer: >60 mL/min (ref >60)
Glucose, Bld: 184 mg/dL — ABNORMAL HIGH (ref 65–99)
Potassium: 3.8 mmol/L (ref 3.5–5.1)
SODIUM: 134 mmol/L — AB (ref 135–145)

## 2016-02-04 LAB — CBC WITH DIFFERENTIAL/PLATELET
BASOS ABS: 0 10*3/uL (ref 0.0–0.1)
Basophils Relative: 0 %
EOS ABS: 0.1 10*3/uL (ref 0.0–0.7)
EOS PCT: 1 %
HCT: 37.3 % (ref 36.0–46.0)
Hemoglobin: 11.6 g/dL — ABNORMAL LOW (ref 12.0–15.0)
Lymphocytes Relative: 32 %
Lymphs Abs: 4.5 10*3/uL — ABNORMAL HIGH (ref 0.7–4.0)
MCH: 23 pg — ABNORMAL LOW (ref 26.0–34.0)
MCHC: 31.1 g/dL (ref 30.0–36.0)
MCV: 73.9 fL — ABNORMAL LOW (ref 78.0–100.0)
MONO ABS: 0.6 10*3/uL (ref 0.1–1.0)
Monocytes Relative: 5 %
Neutro Abs: 8.9 10*3/uL — ABNORMAL HIGH (ref 1.7–7.7)
Neutrophils Relative %: 62 %
PLATELETS: 368 10*3/uL (ref 150–400)
RBC: 5.05 MIL/uL (ref 3.87–5.11)
RDW: 15.6 % — AB (ref 11.5–15.5)
WBC: 14.1 10*3/uL — AB (ref 4.0–10.5)

## 2016-02-04 LAB — TROPONIN I

## 2016-02-04 MED ORDER — ONDANSETRON HCL 4 MG/2ML IJ SOLN
4.0000 mg | Freq: Once | INTRAMUSCULAR | Status: AC
  Administered 2016-02-04: 4 mg via INTRAVENOUS
  Filled 2016-02-04: qty 2

## 2016-02-04 MED ORDER — TRAMADOL-ACETAMINOPHEN 37.5-325 MG PO TABS: 1.0000 | ORAL_TABLET | Freq: Four times a day (QID) | ORAL | Status: DC | PRN

## 2016-02-04 MED ORDER — FENTANYL CITRATE (PF) 100 MCG/2ML IJ SOLN
50.0000 ug | Freq: Once | INTRAMUSCULAR | Status: AC
  Administered 2016-02-04: 50 ug via INTRAVENOUS
  Filled 2016-02-04: qty 2

## 2016-02-04 NOTE — ED Notes (Signed)
Patient presents for epigastric pain radiating to mid center back x1 day. Describes pain as "something that's stuck". Denies dizziness, lightheadedness, weakness, SOB. Reports pain increases with mobility.

## 2016-02-04 NOTE — ED Provider Notes (Signed)
CSN: [id number]     Arrival date & time 02/04/16  1847 History   First MD Initiated Contact with Patient 02/04/16 2124     No chief complaint on file.    (Consider location/radiation/quality/duration/timing/severity/associated sxs/prior Treatment) HPI   [name] is a 40 y.o. female who presents for evaluation of lower chest discomfort present constantly for 3 days, and worse with movement. She denies trauma. No fever, chills, nausea, vomiting, cough or shortness of breath. No prior similar problem. There are no other known modifying factors.   Past Medical History  Diagnosis Date  . Asthma   . Hypertension   . Migraines   . Polycystic ovarian disease   . Diabetes mellitus   . Anemia   . Abnormal Pap smear   . Major depressive disorder ([facility] [place]) 02/08/2012    Reports that 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     Past Surgical History  Procedure Laterality Date  . Cesarean section    . Cervix lesion destruction     Family History  Problem Relation Age of Onset  . Diabetes Mother   . Hypertension Mother   . Hypertension Father   . Diabetes Father    Social History  Substance Use Topics  . Smoking status: Never Smoker   . Smokeless tobacco: Never Used  . Alcohol Use: No   OB History    Gravida Para Term Preterm AB TAB SAB Ectopic Multiple Living   _0 Review of Systems  All other systems reviewed and are negative.     Allergies  Metformin and related and Shellfish allergy  Home Medications   Prior to Admission medications   Medication Sig Start Date End Date Taking? Authorizing Provider  amLODipine (NORVASC) 10 MG tablet Take 1 tablet (10 mg total) by mouth daily. 06/11/15  Yes Zenia Resides, MD  ibuprofen (ADVIL,MOTRIN) 200 MG tablet Take 400 mg by mouth every 6 (six) hours as needed for mild pain.    Yes Historical Provider, MD  metFORMIN (GLUMETZA) 500 MG (MOD) 24 hr tablet  Take 500 mg by mouth daily with breakfast.   Yes Historical Provider, MD  ACCU-CHEK SOFTCLIX LANCETS lancets Check fasting blood sugar daily in the morning 05/15/15   Mariel Aloe, MD  albuterol (PROVENTIL HFA;VENTOLIN HFA) 108 (90 BASE) MCG/ACT inhaler Inhale 2 puffs into the lungs every 4 (four) hours as needed. Asthma attacks 06/29/15   Zenia Resides, MD  Blood Glucose Monitoring Suppl (ACCU-CHEK AVIVA) device Check fasting blood sugar daily in the morning 05/15/15 05/14/16  Mariel Aloe, MD  cloNIDine (CATAPRES) 0.1 MG tablet Take 1 tablet (0.1 mg total) by mouth 2 (two) times daily. Patient not taking: Reported on 02/04/2016 06/29/15   Zenia Resides, MD  ferrous sulfate 325 (65 FE) MG tablet Take 1 tablet (325 mg total) by mouth daily. Patient not taking: Reported on 05/15/2015 06/27/12   Gerda Diss, DO  fluticasone [facility]) 50 MCG/ACT nasal spray Place 2 sprays into both nostrils daily. Patient not taking: Reported on 05/15/2015 03/23/14   Hazel Sams, PA-C  glimepiride (AMARYL) 1 MG tablet Take 1 tablet (1 mg total) by mouth daily with breakfast. 06/11/15   Zenia Resides, MD  glucose blood (ACCU-CHEK AVIVA) test strip Check fasting blood sugar daily in the morning 05/15/15   Mariel Aloe, MD  guaiFENesin (MUCINEX) 600 MG 12 hr tablet Take 1 tablet (600 mg total) by mouth 2 (two) times daily. Patient not taking: Reported on 05/15/2015 03/23/14   Hazel Sams, PA-C  indapamide (LOZOL) 2.5 MG tablet Take 1 tablet (2.5 mg total) by mouth daily. Patient not taking: Reported on 02/04/2016 06/29/15   Zenia Resides, MD  Lancets Misc. (ACCU-CHEK SOFTCLIX LANCET DEV) KIT Check fasting blood sugar daily in the morning 05/15/15   Mariel Aloe, MD  megestrol (MEGACE) 40 MG tablet Take 1 tablet (40 mg total) by mouth 3 (three) times daily. Patient not taking: Reported on 05/15/2015 08/09/13   Seabron Spates, CNM  norgestrel-ethinyl estradiol (OGESTROL) 0.5-50 MG-MCG tablet Take 1  tablet by mouth daily. Patient not taking: Reported on 02/04/2016 06/30/15   Mariel Aloe, MD  traMADol-acetaminophen (ULTRACET) 37.5-325 MG tablet Take 1 tablet by mouth every 6 (six) hours as needed. 02/04/16   Daleen Bo, MD   BP 149/104 mmHg  Pulse 100  Temp(Src) 98 F (36.7 C) (Oral)  Resp 20  Ht _0  (1.448 m)  Wt 219 lb (99.338 kg)  BMI 47.38 kg/m2  SpO2 97% Physical Exam  Constitutional: She is oriented to person, place, and time. She appears well-developed and well-nourished. No distress.  HENT:  Head: Normocephalic and atraumatic.  Right Ear: External ear normal.  Left Ear: External ear normal.  Eyes: Conjunctivae and EOM are normal. Pupils are equal, round, and reactive to light.  Neck: Normal range of motion and phonation normal. Neck supple.  Cardiovascular: Normal rate, regular rhythm and normal heart sounds.   Pulmonary/Chest: Effort normal and breath sounds normal. She exhibits tenderness (Mild diffuse anterior posterior chest wall tenderness without crepitation or deformity.). She exhibits no bony tenderness.  Abdominal: Soft. There is no tenderness.  Musculoskeletal: Normal range of motion. She exhibits no tenderness.  Neurological: She is alert and oriented to person, place, and time. No cranial nerve deficit or sensory deficit. She exhibits normal muscle tone. Coordination normal.  Skin: Skin is warm, dry and intact.  Psychiatric: She has a normal mood and affect. Her behavior is normal. Judgment and thought content normal.  Nursing note and vitals reviewed.   ED Course  Procedures (including critical care time)  Medications  fentaNYL (SUBLIMAZE) injection 50 mcg (50 mcg Intravenous Given 02/04/16 2240)  ondansetron (ZOFRAN) injection 4 mg (4 mg Intravenous Given 02/04/16 2240)    Patient Vitals for the past 24 hrs:  BP Temp Temp src Pulse Resp SpO2 Height Weight  02/04/16 2131 (!) 149/104 mmHg - - 100 20 97 % - -  02/04/16 1857 (!) 172/111 mmHg 98 F  (36.7 C) Oral 112 16 96 % _1  (1.448 m) 219 lb (99.338 kg)    11:28 PM Reevaluation with update and discussion. After initial assessment and treatment, an updated evaluation reveals Pain is improved, and she is somewhat sleepy after the medication which was given. Findings discussed with the patient's cousin who states that she will take the patient home. All questions were answered.Daleen Bo L   Labs Review Labs Reviewed  BASIC METABOLIC PANEL - Abnormal; Notable for the following:    Sodium 134 (*)    Glucose, Bld 184 (*)    All other components within normal limits  CBC WITH DIFFERENTIAL/PLATELET - Abnormal; Notable for the following:    WBC 14.1 (*)    Hemoglobin 11.6 (*)    MCV 73.9 (*)    MCH 23.0 (*)  RDW 15.6 (*)    Neutro Abs 8.9 (*)    Lymphs Abs 4.5 (*)    All other components within normal limits  TROPONIN I  D-DIMER, QUANTITATIVE (NOT AT [facility])    Imaging Review Dg Chest 2 View  02/04/2016  CLINICAL DATA:  40 year old female with mid chest pain and upper back pain. EXAM: CHEST  2 VIEW COMPARISON:  Chest radiograph dated 02/01/2005 FINDINGS: Two views of the chest do not demonstrate a focal consolidation. There is no pleural effusion or pneumothorax. Stable top-normal cardiac size. No acute osseous pathology. IMPRESSION: No active cardiopulmonary disease. Electronically Signed   By: Anner Crete M.D.   On: 02/04/2016 22:35   I have personally reviewed and evaluated these images and lab results as part of my medical decision-making.   EKG Interpretation   Date/Time:  Thursday February 04 2016 18:58:31 EDT Ventricular Rate:  110 PR Interval:    QRS Duration: 81 QT Interval:  327 QTC Calculation: 443 R Axis:   23 Text Interpretation:  Sinus tachycardia Borderline T wave abnormalities  Since last tracing rate faster Confirmed by [facility]  MD, Eulia Hatcher ([id number]) on  02/04/2016 9:56:41 PM      MDM   Final diagnoses:  Chest wall pain    Chest wall pain,  nonspecific. Reassuring examination. Doubt PE, pneumonia, ACS, serious bacterial infection or metabolic instability.  Nursing Notes Reviewed/ Care Coordinated Applicable Imaging Reviewed Interpretation of Laboratory Data incorporated into ED treatment  The patient appears reasonably screened and/or stabilized for discharge and I doubt any other medical condition or other [facility] requiring further screening, evaluation, or treatment in the ED at this time prior to discharge.  Plan: Home Medications- Tylenol or Ultracet; Home Treatments- rest, heat; return here if the recommended treatment, does not improve the symptoms; Recommended follow up- PCP if not better in 3 or 4 days     Daleen Bo, MD 02/04/16 2329

## 2016-02-04 NOTE — Discharge Instructions (Signed)
Use heat on the sore area 3 or 4 times a day. Take Tylenol for pain or use the prescription pain medication.   Chest Wall Pain Chest wall pain is pain in or around the bones and muscles of your chest. Sometimes, an injury causes this pain. Sometimes, the cause may not be known. This pain may take several weeks or longer to get better. HOME CARE INSTRUCTIONS  Pay attention to any changes in your symptoms. Take these actions to help with your pain:   Rest as told by your health care provider.   Avoid activities that cause pain. These include any activities that use your chest muscles or your abdominal and side muscles to lift heavy items.   If directed, apply ice to the painful area:  Put ice in a plastic bag.  Place a towel between your skin and the bag.  Leave the ice on for 20 minutes, 2-3 times per day.  Take over-the-counter and prescription medicines only as told by your health care provider.  Do not use tobacco products, including cigarettes, chewing tobacco, and e-cigarettes. If you need help quitting, ask your health care provider.  Keep all follow-up visits as told by your health care provider. This is important. SEEK MEDICAL CARE IF:  You have a fever.  Your chest pain becomes worse.  You have new symptoms. SEEK IMMEDIATE MEDICAL CARE IF:  You have nausea or vomiting.  You feel sweaty or light-headed.  You have a cough with phlegm (sputum) or you cough up blood.  You develop shortness of breath.   This information is not intended to replace advice given to you by your health care provider. Make sure you discuss any questions you have with your health care provider.   Document Released: 07/11/2005 Document Revised: 04/01/2015 Document Reviewed: 10/06/2014 Elsevier Interactive Patient Education [employer].

## 2016-08-09 ENCOUNTER — Emergency Department (HOSPITAL_COMMUNITY)
Admission: EM | Admit: 2016-08-09 | Discharge: 2016-08-09 | Disposition: A | Discharge status: Home / Self Care | Payer: Medicaid Other | Attending: Emergency Medicine | Admitting: Emergency Medicine

## 2016-08-09 ENCOUNTER — Encounter: Payer: Self-pay | Admitting: Emergency Medicine

## 2016-08-09 DIAGNOSIS — I10 Essential (primary) hypertension: Secondary | ICD-10-CM | POA: Insufficient documentation

## 2016-08-09 DIAGNOSIS — Y999 Unspecified external cause status: Secondary | ICD-10-CM | POA: Diagnosis not present

## 2016-08-09 DIAGNOSIS — J45909 Unspecified asthma, uncomplicated: Secondary | ICD-10-CM | POA: Diagnosis not present

## 2016-08-09 DIAGNOSIS — E119 Type 2 diabetes mellitus without complications: Secondary | ICD-10-CM | POA: Insufficient documentation

## 2016-08-09 DIAGNOSIS — S6992XA Unspecified injury of left wrist, hand and finger(s), initial encounter: Secondary | ICD-10-CM | POA: Diagnosis present

## 2016-08-09 DIAGNOSIS — Z7984 Long term (current) use of oral hypoglycemic drugs: Secondary | ICD-10-CM | POA: Insufficient documentation

## 2016-08-09 DIAGNOSIS — S60222A Contusion of left hand, initial encounter: Secondary | ICD-10-CM | POA: Diagnosis not present

## 2016-08-09 DIAGNOSIS — Y939 Activity, unspecified: Secondary | ICD-10-CM | POA: Insufficient documentation

## 2016-08-09 DIAGNOSIS — Y92410 Unspecified street and highway as the place of occurrence of the external cause: Secondary | ICD-10-CM | POA: Diagnosis not present
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
## 2016-08-09 NOTE — ED Notes (Signed)
Patient was alert, oriented and stable upon discharge. RN went over AVS and patient had no further questions.  

## 2017-06-12 ENCOUNTER — Encounter (HOSPITAL_COMMUNITY): Payer: Self-pay | Admitting: Family Medicine

## 2017-06-12 ENCOUNTER — Emergency Department (HOSPITAL_COMMUNITY)
Admission: EM | Admit: 2017-06-12 | Discharge: 2017-06-12 | Disposition: A | Discharge status: Home / Self Care | Payer: Self-pay | Attending: Emergency Medicine | Admitting: Emergency Medicine

## 2017-06-12 DIAGNOSIS — J45909 Unspecified asthma, uncomplicated: Secondary | ICD-10-CM | POA: Insufficient documentation

## 2017-06-12 DIAGNOSIS — L309 Dermatitis, unspecified: Secondary | ICD-10-CM

## 2017-06-12 DIAGNOSIS — N9089 Other specified noninflammatory disorders of vulva and perineum: Secondary | ICD-10-CM | POA: Insufficient documentation

## 2017-06-12 DIAGNOSIS — Z7984 Long term (current) use of oral hypoglycemic drugs: Secondary | ICD-10-CM | POA: Insufficient documentation

## 2017-06-12 DIAGNOSIS — B9689 Other specified bacterial agents as the cause of diseases classified elsewhere: Secondary | ICD-10-CM | POA: Insufficient documentation

## 2017-06-12 DIAGNOSIS — I10 Essential (primary) hypertension: Secondary | ICD-10-CM | POA: Insufficient documentation

## 2017-06-12 DIAGNOSIS — Z79899 Other long term (current) drug therapy: Secondary | ICD-10-CM | POA: Insufficient documentation

## 2017-06-12 DIAGNOSIS — E119 Type 2 diabetes mellitus without complications: Secondary | ICD-10-CM | POA: Insufficient documentation

## 2017-06-12 DIAGNOSIS — N760 Acute vaginitis: Secondary | ICD-10-CM | POA: Insufficient documentation

## 2017-06-12 LAB — URINALYSIS, ROUTINE W REFLEX MICROSCOPIC
BACTERIA UA: NONE SEEN
Bilirubin Urine: NEGATIVE
Hgb urine dipstick: NEGATIVE
Ketones, ur: NEGATIVE mg/dL
LEUKOCYTES UA: NEGATIVE
NITRITE: NEGATIVE
Protein, ur: NEGATIVE mg/dL
RBC / HPF: NONE SEEN RBC/hpf (ref 0–5)
Specific Gravity, Urine: 1.036 — ABNORMAL HIGH (ref 1.005–1.030)
pH: 6 (ref 5.0–8.0)

## 2017-06-12 LAB — POC URINE PREG, ED: Preg Test, Ur: NEGATIVE

## 2017-06-12 LAB — WET PREP, GENITAL
SPERM: NONE SEEN
TRICH WET PREP: NONE SEEN
WBC, Wet Prep HPF POC: NONE SEEN
Yeast Wet Prep HPF POC: NONE SEEN

## 2017-06-12 MED ORDER — TRIAMCINOLONE ACETONIDE 0.1 % EX CREA: 1.0000 "application " | TOPICAL_CREAM | Freq: Two times a day (BID) | CUTANEOUS | 0 refills | Status: DC

## 2017-06-12 MED ORDER — METRONIDAZOLE 500 MG PO TABS: 500.0000 mg | ORAL_TABLET | Freq: Two times a day (BID) | ORAL | 0 refills | Status: DC

## 2017-06-12 NOTE — ED Provider Notes (Signed)
[facility] DEPT Provider Note   CSN: [id number] Arrival date & time: 06/12/17  1525     History   Chief Complaint Chief Complaint  Patient presents with  . Vaginal Discharge    HPI [name] is a 41 y.o. female presenting with vaginal discharge and irritation.  Patient states that for the past 3 weeks, she has been having vaginal discharge.  She has been using over-the-counter Monistat without improvement.  She reports irritation of the labia beginning yesterday.  She reports some suprapubic discomfort, worse at night.  This is improved when she urinates.  She denies dysuria or hematuria.  She states she has a history of diabetes, does not check her blood sugars normally.  She has high blood pressure, but is not taking blood pressure medicine.  She denies fevers, chills, chest pain, shortness breath, nausea, vomiting, or abnormal bowel movements. Her period is supposed to start today.  She states there is no chance she is pregnant.  HPI  Past Medical History:  Diagnosis Date  . Abnormal Pap smear   . Anemia   . Asthma   . Diabetes mellitus   . Hypertension   . 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    . Migraines   . Polycystic ovarian disease     Patient Active Problem List   Diagnosis Date Noted  . Obstructive sleep apnea 06/30/2015  . Dyspnea 06/29/2015  . Missed period 04/05/2013  . Anemia due to blood loss, chronic 06/27/2012  . Allergic rhinitis 06/27/2012  . Major depressive disorder 02/08/2012  . Hypertension 12/04/2011  . Dysmenorrhea 11/22/2011  . Diabetes mellitus, type 2 ([place]) 05/19/2011  . PCOS (polycystic ovarian syndrome) 05/19/2011  . Hydrosalpinx 08/21/2009    Past Surgical History:  Procedure Laterality Date  . CERVIX LESION DESTRUCTION    . CESAREAN SECTION      OB History    Gravida Para Term  Preterm AB Living   '2 2 2     2   ' SAB TAB Ectopic Multiple Live Births           2       Home Medications    Prior to Admission medications   Medication Sig Start Date End Date Taking? Authorizing Provider  Doxylamine Succinate, Sleep, (SLEEP AID PO) Take 1 tablet at bedtime by mouth.   Yes [provider]  ACCU-CHEK SOFTCLIX LANCETS lancets Check fasting blood sugar daily in the morning 05/15/15   Mariel Aloe, MD  albuterol (PROVENTIL HFA;VENTOLIN HFA) 108 (90 BASE) MCG/ACT inhaler Inhale 2 puffs into the lungs every 4 (four) hours as needed. Asthma attacks 06/29/15   Zenia Resides, MD  amLODipine (NORVASC) 10 MG tablet Take 1 tablet (10 mg total) by mouth daily. 06/11/15   Zenia Resides, MD  cloNIDine (CATAPRES) 0.1 MG tablet Take 1 tablet (0.1 mg total) by mouth 2 (two) times daily. Patient not taking: Reported on 02/04/2016 06/29/15   Zenia Resides, MD  ferrous sulfate 325 (65 FE) MG tablet Take 1 tablet (325 mg total) by mouth daily. Patient not taking: Reported on 05/15/2015 06/27/12   Gerda Diss, DO  fluticasone [facility]) 50 MCG/ACT nasal spray Place 2 sprays into both nostrils daily. Patient not taking: Reported on 05/15/2015 03/23/14   Hazel Sams, PA-C  glimepiride (AMARYL) 1 MG tablet Take 1 tablet (1  mg total) by mouth daily with breakfast. 06/11/15   Zenia Resides, MD  glucose blood (ACCU-CHEK AVIVA) test strip Check fasting blood sugar daily in the morning 05/15/15   Mariel Aloe, MD  guaiFENesin (MUCINEX) 600 MG 12 hr tablet Take 1 tablet (600 mg total) by mouth 2 (two) times daily. Patient not taking: Reported on 05/15/2015 03/23/14   Hazel Sams, PA-C  ibuprofen (ADVIL,MOTRIN) 200 MG tablet Take 400 mg by mouth every 6 (six) hours as needed for mild pain.     [provider]  indapamide (LOZOL) 2.5 MG tablet Take 1 tablet (2.5 mg total) by mouth daily. Patient not taking: Reported on 02/04/2016 06/29/15   Zenia Resides, MD    Lancets Misc. (ACCU-CHEK SOFTCLIX LANCET DEV) KIT Check fasting blood sugar daily in the morning 05/15/15   Mariel Aloe, MD  megestrol (MEGACE) 40 MG tablet Take 1 tablet (40 mg total) by mouth 3 (three) times daily. Patient not taking: Reported on 05/15/2015 08/09/13   Seabron Spates, CNM  metFORMIN (GLUMETZA) 500 MG (MOD) 24 hr tablet Take 500 mg by mouth daily with breakfast.    [provider]  metroNIDAZOLE (FLAGYL) 500 MG tablet Take 1 tablet (500 mg total) 2 (two) times daily by mouth. 06/12/17   Alya Smaltz, PA-C  norgestrel-ethinyl estradiol (OGESTROL) 0.5-50 MG-MCG tablet Take 1 tablet by mouth daily. Patient not taking: Reported on 02/04/2016 06/30/15   Mariel Aloe, MD  traMADol-acetaminophen (ULTRACET) 37.5-325 MG tablet Take 1 tablet by mouth every 6 (six) hours as needed. Patient not taking: Reported on 06/12/2017 02/04/16   Daleen Bo, MD  triamcinolone cream (KENALOG) 0.1 % Apply 1 application 2 (two) times daily topically. 06/12/17   Lylianna Fraiser, PA-C    Family History Family History  Problem Relation Age of Onset  . Diabetes Mother   . Hypertension Mother   . Hypertension Father   . Diabetes Father     Social History Social History   Tobacco Use  . Smoking status: Never Smoker  . Smokeless tobacco: Never Used  Substance Use Topics  . Alcohol use: No    Alcohol/week: 0.0 oz  . Drug use: No     Allergies   Metformin and related and Shellfish allergy   Review of Systems Review of Systems  Constitutional: Negative for chills and fever.  Genitourinary: Positive for vaginal discharge.     Physical Exam Updated Vital Signs BP (!) 172/135   Pulse (!) 117   Temp 98.7 F (37.1 C) (Oral)   Resp 16   Ht '4\' 11"'  (1.499 m)   Wt 93 kg (205 lb)   LMP 05/12/2017   SpO2 92%   BMI 41.40 kg/m   Physical Exam  Constitutional: She is oriented to person, place, and time. She appears well-developed and well-nourished. No distress.   HENT:  Head: Normocephalic and atraumatic.  Eyes: EOM are normal.  Neck: Normal range of motion.  Cardiovascular: Normal rate, regular rhythm and intact distal pulses.  Pulmonary/Chest: Effort normal and breath sounds normal. No respiratory distress. She has no wheezes.  Abdominal: Soft. She exhibits no distension and no mass. There is no tenderness. There is no guarding.  Genitourinary: Rectum normal, vagina normal and uterus normal. Pelvic exam was performed with patient supine. Cervix exhibits discharge. Cervix exhibits no motion tenderness and no friability. Right adnexum displays no mass, no tenderness and no fullness. Left adnexum displays no mass, no tenderness and no fullness.  Genitourinary Comments:  Chaperone present.   Bilateral outer labia skin thickening and irritation.  No painful lesions.  Minimal white discharge.  No CMT.  Musculoskeletal: Normal range of motion.  Neurological: She is alert and oriented to person, place, and time.  Skin: Skin is warm and dry.  Psychiatric: She has a normal mood and affect.  Nursing note and vitals reviewed.    ED Treatments / Results  Labs (all labs ordered are listed, but only abnormal results are displayed) Labs Reviewed  WET PREP, GENITAL - Abnormal; Notable for the following components:      Result Value   Clue Cells Wet Prep HPF POC PRESENT (*)    All other components within normal limits  URINALYSIS, ROUTINE W REFLEX MICROSCOPIC - Abnormal; Notable for the following components:   Color, Urine STRAW (*)    Specific Gravity, Urine 1.036 (*)    Glucose, UA >=500 (*)    Squamous Epithelial / LPF 0-5 (*)    All other components within normal limits  POC URINE PREG, ED  GC/CHLAMYDIA PROBE AMP ([place]) NOT AT [facility]    EKG  EKG Interpretation None       Radiology No results found.  Procedures Procedures (including critical care time)  Medications Ordered in ED Medications - No data to display   Initial  Impression / Assessment and Plan / ED Course  I have reviewed the triage vital signs and the nursing notes.  Pertinent labs & imaging results that were available during my care of the patient were reviewed by me and considered in my medical decision making (see chart for details).     Patient presenting with 3-week history of vaginal discharge.  Physical exam reassuring, she is afebrile, not tachycardic.  She does not appear toxic.  Pelvic exam shows thin white discharge and labial irritation.  Positive for BV.  We will treat with Flagyl and triamcinolone.  Patient to follow-up with OB/GYN.  Patient aware gonorrhea and chlamydia are pending.  At this time, patient appears safe for discharge.  Return precautions given.  Patient states she understands and agrees to plan.   Final Clinical Impressions(s) / ED Diagnoses   Final diagnoses:  BV (bacterial vaginosis)  Vulvar dermatitis    ED Discharge Orders        Ordered    metroNIDAZOLE (FLAGYL) 500 MG tablet  2 times daily     06/12/17 2114    triamcinolone cream (KENALOG) 0.1 %  2 times daily     06/12/17 2114       Franchot Heidelberg, PA-C 06/12/17 2116    Isla Pence, MD 06/12/17 2202

## 2017-06-12 NOTE — Discharge Instructions (Signed)
Take Flagyl twice a day for the next week.  It is very important that you not drink while taking this medication. Apply Kenalog cream to the outer part of the vagina twice a day as needed for itching and irritation.  It is important that you do not apply this inside the vagina. It is important that you follow-up with primary care for management of your blood pressure and blood sugars. Follow-up with OB/GYN if your symptoms are not improving. Return to the emergency room if you develop fevers, vomiting, or any new or worsening symptoms.

## 2017-06-12 NOTE — ED Triage Notes (Addendum)
Patient reports she has a yeast infection and been treating with OTC Monostat medication. Furthermore, describes her vaginal discharge is thin, creamy white. Denies an odor. Symptoms started about 3 weeks ago. Also, reports she has a vaginal rash that she noticed last night and nausea. Denies vomiting, diarrhea, or fever.

## 2017-06-13 LAB — GC/CHLAMYDIA PROBE AMP (~~LOC~~) NOT AT ARMC
Chlamydia: NEGATIVE
Neisseria Gonorrhea: NEGATIVE

## 2017-09-05 ENCOUNTER — Telehealth: Payer: Self-pay | Admitting: Internal Medicine

## 2017-09-05 NOTE — Telephone Encounter (Signed)
Pt came in office asking for a refill of her Blood pressure Rx. Stated that she was doing okay and not taking it for a year, but then she was seen by other MD and told her to come here and ask for a refill of her Blood pressure medicine. Call [phone] if you have any questions. Thank you.

## 2017-09-06 NOTE — Telephone Encounter (Signed)
Patient has not been seen at [facility] since 06/30/15. She will need an appointment to discuss blood pressure.   Phill Myron, D.O. 09/06/2017, 11:55 AM PGY-3, [place]

## 2017-10-04 ENCOUNTER — Encounter: Payer: Self-pay | Admitting: Internal Medicine

## 2017-10-04 ENCOUNTER — Other Ambulatory Visit: Payer: Self-pay

## 2017-10-04 ENCOUNTER — Ambulatory Visit (INDEPENDENT_AMBULATORY_CARE_PROVIDER_SITE_OTHER): Payer: Self-pay | Admitting: Internal Medicine

## 2017-10-04 VITALS — BP 162/128 | HR 116 | Temp 97.9°F | Ht 59.0 in | Wt 206.0 lb

## 2017-10-04 DIAGNOSIS — I10 Essential (primary) hypertension: Secondary | ICD-10-CM

## 2017-10-04 MED ORDER — HYDROCHLOROTHIAZIDE 25 MG PO TABS: 25.0000 mg | ORAL_TABLET | Freq: Every day | ORAL | 3 refills | Status: DC

## 2017-10-04 NOTE — Patient Instructions (Addendum)
I have prescribed HCTZ 25 mg for your blood pressure. Please monitor your blood pressure at home every 1-2 days and record this number. Bring this log with you to your next visit.

## 2017-10-05 LAB — BASIC METABOLIC PANEL
BUN/Creatinine Ratio: 14 (ref 9–23)
BUN: 9 mg/dL (ref 6–20)
CALCIUM: 9.2 mg/dL (ref 8.7–10.2)
CO2: 21 mmol/L (ref 20–29)
CREATININE: 0.66 mg/dL (ref 0.57–1.00)
Chloride: 99 mmol/L (ref 96–106)
GFR calc Af Amer: 129 mL/min/{1.73_m2} (ref >59)
GFR, EST NON AFRICAN AMERICAN: 112 mL/min/{1.73_m2} (ref >59)
GLUCOSE: 239 mg/dL — AB (ref 65–99)
Potassium: 4.3 mmol/L (ref 3.5–5.2)
Sodium: 134 mmol/L (ref 134–144)

## 2017-10-09 NOTE — Progress Notes (Signed)
   Subjective:    [name] - 41 y.o. female MRN [id number]  Date of birth: 03/05/1976  HPI  [name] is here for follow up of hypertension. Patient reports history of HTN. Last saw almost 3 years ago. Was previously taking Amlodipine 10 mg daily and Clonidine 0.1 mg BID but has been without those medications for at least 2 years. Patient denies vision changes, headaches different from her typical headaches, chest pain, and dyspnea. She reports that it took a lot for her to come to this appointment but that she was motivated to by need to take care of herself to be around for her children and grandchildren.   -  reports that  has never smoked. she has never used smokeless tobacco. - Review of Systems: Per HPI. - Past Medical History: Patient Active Problem List   Diagnosis Date Noted  . Obstructive sleep apnea 06/30/2015  . Dyspnea 06/29/2015  . Missed period 04/05/2013  . Anemia due to blood loss, chronic 06/27/2012  . Allergic rhinitis 06/27/2012  . Major depressive disorder 02/08/2012  . Hypertension 12/04/2011  . Dysmenorrhea 11/22/2011  . Diabetes mellitus, type 2 ([place]) 05/19/2011  . PCOS (polycystic ovarian syndrome) 05/19/2011  . Hydrosalpinx 08/21/2009   - Medications: reviewed and updated   Objective:   Physical Exam BP (!) 162/128   Pulse (!) 116   Temp 97.9 F (36.6 C) (Oral)   Ht 4\' 11"  (1.499 m)   Wt 206 lb (93.4 kg)   LMP 09/03/2017   SpO2 98%   BMI 41.61 kg/m  Gen: NAD, alert, cooperative with exam, well-appearing CV: RRR, good S1/S2, no murmur, no edema, capillary refill brisk  Resp: CTABL, no wheezes, non-labored  Assessment & Plan:   1. Essential hypertension Patient with elevated BP at this visit with known history of HTN. Will prescribe HCTZ and check BMET today to evaluate kidney function given suspected prolonged uncontrolled HTN. Patient to record BPs at home and return in 2 weeks for check up. Will plan to repeat BMET at that visit as  well given addition of new medication. Noted after patient left that she has a history of T2DM with last A1c 8.2 in 2016. Would likely benefit from Ace/Arb therapy. Will discuss DM at next visit and plan to check A1c. Would also likely benefit from statin therapy. Plan to check lipid panel.  - hydrochlorothiazide (HYDRODIURIL) 25 MG tablet; Take 1 tablet (25 mg total) by mouth daily.  Dispense: 90 tablet; Refill: 3 - Basic Metabolic Panel  Patient overdue for several health maintenance topics. She has avoided coming for doctors appointment for close to 3 years. Anticipate need for several visits to update health maintenance and to work on chronic medical conditions.   Phill Myron, D.O. 10/09/2017, 2:44 PM PGY-3, [place]

## 2017-10-20 ENCOUNTER — Encounter: Payer: Self-pay | Admitting: Internal Medicine

## 2017-10-20 ENCOUNTER — Other Ambulatory Visit: Payer: Self-pay

## 2017-10-20 ENCOUNTER — Ambulatory Visit (INDEPENDENT_AMBULATORY_CARE_PROVIDER_SITE_OTHER): Payer: Self-pay | Admitting: Internal Medicine

## 2017-10-20 VITALS — BP 174/108 | HR 127 | Temp 98.3°F | Ht 59.0 in | Wt 203.4 lb

## 2017-10-20 DIAGNOSIS — E119 Type 2 diabetes mellitus without complications: Secondary | ICD-10-CM

## 2017-10-20 DIAGNOSIS — I10 Essential (primary) hypertension: Secondary | ICD-10-CM

## 2017-10-20 LAB — POCT GLYCOSYLATED HEMOGLOBIN (HGB A1C): Hemoglobin A1C: 11.2

## 2017-10-20 MED ORDER — LISINOPRIL 10 MG PO TABS
10.0000 mg | ORAL_TABLET | Freq: Every day | ORAL | 3 refills | Status: DC
Start: 2017-10-20 — End: 2017-10-23

## 2017-10-20 NOTE — Assessment & Plan Note (Addendum)
Continues to remain uncontrolled. No red flag symptoms. Will add Lisinopril 10 mg given added benefit of renal protection with DM. Will likely need to increase this dose and possibly add a third agent.

## 2017-10-20 NOTE — Patient Instructions (Signed)
I am proud of you for showing up and working to get your blood pressure under control! I have prescribed Lisinopril which also helps protect kidneys with diabetes.    Diabetes Mellitus and Nutrition When you have diabetes (diabetes mellitus), it is very important to have healthy eating habits because your blood sugar (glucose) levels are greatly affected by what you eat and drink. Eating healthy foods in the appropriate amounts, at about the same times every day, can help you:  Control your blood glucose.  Lower your risk of heart disease.  Improve your blood pressure.  Reach or maintain a healthy weight.  Every person with diabetes is different, and each person has different needs for a meal plan. Your health care provider may recommend that you work with a diet and nutrition specialist (dietitian) to make a meal plan that is best for you. Your meal plan may vary depending on factors such as:  The calories you need.  The medicines you take.  Your weight.  Your blood glucose, blood pressure, and cholesterol levels.  Your activity level.  Other health conditions you have, such as heart or kidney disease.  How do carbohydrates affect me? Carbohydrates affect your blood glucose level more than any other type of food. Eating carbohydrates naturally increases the amount of glucose in your blood. Carbohydrate counting is a method for keeping track of how many carbohydrates you eat. Counting carbohydrates is important to keep your blood glucose at a healthy level, especially if you use insulin or take certain oral diabetes medicines. It is important to know how many carbohydrates you can safely have in each meal. This is different for every person. Your dietitian can help you calculate how many carbohydrates you should have at each meal and for snack. Foods that contain carbohydrates include:  Bread, cereal, rice, pasta, and crackers.  Potatoes and corn.  Peas, beans, and  lentils.  Milk and yogurt.  Fruit and juice.  Desserts, such as cakes, cookies, ice cream, and candy.  How does alcohol affect me? Alcohol can cause a sudden decrease in blood glucose (hypoglycemia), especially if you use insulin or take certain oral diabetes medicines. Hypoglycemia can be a life-threatening condition. Symptoms of hypoglycemia (sleepiness, dizziness, and confusion) are similar to symptoms of having too much alcohol. If your health care provider says that alcohol is safe for you, follow these guidelines:  Limit alcohol intake to no more than 1 drink per day for nonpregnant women and 2 drinks per day for men. One drink equals 12 oz of beer, 5 oz of wine, or 1 oz of hard liquor.  Do not drink on an empty stomach.  Keep yourself hydrated with water, diet soda, or unsweetened iced tea.  Keep in mind that regular soda, juice, and other mixers may contain a lot of sugar and must be counted as carbohydrates.  What are tips for following this plan? Reading food labels  Start by checking the serving size on the label. The amount of calories, carbohydrates, fats, and other nutrients listed on the label are based on one serving of the food. Many foods contain more than one serving per package.  Check the total grams (g) of carbohydrates in one serving. You can calculate the number of servings of carbohydrates in one serving by dividing the total carbohydrates by 15. For example, if a food has 30 g of total carbohydrates, it would be equal to 2 servings of carbohydrates.  Check the number of grams (g) of saturated  and trans fats in one serving. Choose foods that have low or no amount of these fats.  Check the number of milligrams (mg) of sodium in one serving. Most people should limit total sodium intake to less than 2,300 mg per day.  Always check the nutrition information of foods labeled as "low-fat" or "nonfat". These foods may be higher in added sugar or refined carbohydrates  and should be avoided.  Talk to your dietitian to identify your daily goals for nutrients listed on the label. Shopping  Avoid buying canned, premade, or processed foods. These foods tend to be high in fat, sodium, and added sugar.  Shop around the outside edge of the grocery store. This includes fresh fruits and vegetables, bulk grains, fresh meats, and fresh dairy. Cooking  Use low-heat cooking methods, such as baking, instead of high-heat cooking methods like deep frying.  Cook using healthy oils, such as olive, canola, or sunflower oil.  Avoid cooking with butter, cream, or high-fat meats. Meal planning  Eat meals and snacks regularly, preferably at the same times every day. Avoid going long periods of time without eating.  Eat foods high in fiber, such as fresh fruits, vegetables, beans, and whole grains. Talk to your dietitian about how many servings of carbohydrates you can eat at each meal.  Eat 4-6 ounces of lean protein each day, such as lean meat, chicken, fish, eggs, or tofu. 1 ounce is equal to 1 ounce of meat, chicken, or fish, 1 egg, or 1/4 cup of tofu.  Eat some foods each day that contain healthy fats, such as avocado, nuts, seeds, and fish. Lifestyle   Check your blood glucose regularly.  Exercise at least 30 minutes 5 or more days each week, or as told by your health care provider.  Take medicines as told by your health care provider.  Do not use any products that contain nicotine or tobacco, such as cigarettes and e-cigarettes. If you need help quitting, ask your health care provider.  Work with a Social worker or diabetes educator to identify strategies to manage stress and any emotional and social challenges. What are some questions to ask my health care provider?  Do I need to meet with a diabetes educator?  Do I need to meet with a dietitian?  What number can I call if I have questions?  When are the best times to check my blood glucose? Where to find  more information:  American Diabetes Association: diabetes.org/food-and-fitness/food  Academy of Nutrition and Dietetics: PokerClues.dk  [employer] of Diabetes and Digestive and Kidney Diseases (NIH): ContactWire.be Summary  A healthy meal plan will help you control your blood glucose and maintain a healthy lifestyle.  Working with a diet and nutrition specialist (dietitian) can help you make a meal plan that is best for you.  Keep in mind that carbohydrates and alcohol have immediate effects on your blood glucose levels. It is important to count carbohydrates and to use alcohol carefully. This information is not intended to replace advice given to you by your health care provider. Make sure you discuss any questions you have with your health care provider. Document Released: 04/07/2005 Document Revised: 08/15/2016 Document Reviewed: 08/15/2016 Elsevier Interactive Patient Education  [employer].

## 2017-10-20 NOTE — Assessment & Plan Note (Addendum)
Patient reports history of pre-diabetes; however, A1c noted to be 8% in 2016. A1c reveals poor glycemic control with result of 11.2% today. Patient to return on Monday, 4/1, to discuss medication options with pharmacy clinic. Dr. Valentina Lucks met with patient briefly today and she is agreeable to close follow up. Encouraged patient to continue lifestyle changes and congratulated her on >20 lb weight loss in the past 2 years. Instructed to drink only water over the weekend in an attempt not to worsen glucose. Check urine microalbumin today. Will check lipid panel today. Suspect patient would benefit from statin therapy. Would need to ensure adequate contraception. Noted that she was previously prescribed OCPs in 2016.

## 2017-10-20 NOTE — Progress Notes (Addendum)
Subjective:    [name] - 41 y.o. female MRN [id number]  Date of birth: Jun 23, 1976  HPI  [name] is here for follow up of HTN.  HTN: Recently resumed pharmacotherapy with addition of HCTZ 25 mg daily on 3/13. Patient reports compliance. She states that she has been monitoring BP at home and it has remained elevated typically >150/100. Denies severe headaches, vision changes, chest pain. Patient is a school bus driver and she is concerned that her license may be taken away from her due to liability/danger driving with elevated BP.   T2DM: Patient reports history of "pre-diabetes". Notes that she had an allergy to Metformin (GI side effects). She is unsure what other medications she took. From chart review, appears she was most recently prescribed Amaryl 1 mg in 2016. Has checked her CBGs at home in the past. Not willing to consider injectable therapies. Has never been on insulin in the past. Can't stand to watch her father give himself insulin injections for his T2DM. Reports nocturia 2x per night on average. Reports she has changed her diet and is working to lose weight. She goes to gym 2x per week for cardio.     -  reports that she has never smoked. She has never used smokeless tobacco. - Review of Systems: Per HPI. - Past Medical History: Patient Active Problem List   Diagnosis Date Noted  . Obstructive sleep apnea 06/30/2015  . Dyspnea 06/29/2015  . Missed period 04/05/2013  . Anemia due to blood loss, chronic 06/27/2012  . Allergic rhinitis 06/27/2012  . Major depressive disorder 02/08/2012  . Hypertension 12/04/2011  . Dysmenorrhea 11/22/2011  . Diabetes mellitus, type 2 ([place]) 05/19/2011  . PCOS (polycystic ovarian syndrome) 05/19/2011  . Hydrosalpinx 08/21/2009   - Medications: reviewed and updated   Objective:   Physical Exam BP (!) 174/108 (BP Location: Left Arm, Patient Position: Sitting, Cuff Size: Large)   Pulse (!) 127   Temp 98.3 F (36.8 C) (Oral)    Ht '4\' 11"'  (1.499 m)   Wt 203 lb 6.4 oz (92.3 kg)   SpO2 99%   BMI 41.08 kg/m  Gen: NAD, alert, cooperative with exam, well-appearing CV: RRR, good S1/S2, no murmur Resp: CTABL, no wheezes, non-labored Psych: good insight, alert and oriented, tearful when discussing medical conditions and fear over losing job      Assessment & Plan:   Hypertension Continues to remain uncontrolled. No red flag symptoms. Will add Lisinopril 10 mg given added benefit of renal protection with DM. Will likely need to increase this dose and possibly add a third agent.   Diabetes mellitus, type 2 Patient reports history of pre-diabetes; however, A1c noted to be 8% in 2016. A1c reveals poor glycemic control with result of 11.2% today. Patient to return on Monday, 4/1, to discuss medication options with pharmacy clinic. Dr. Valentina Lucks met with patient briefly today and she is agreeable to close follow up. Encouraged patient to continue lifestyle changes and congratulated her on >20 lb weight loss in the past 2 years. Instructed to drink only water over the weekend in an attempt not to worsen glucose. Check urine microalbumin today. Will check lipid panel today. Suspect patient would benefit from statin therapy. Would need to ensure adequate contraception. Noted that she was previously prescribed OCPs in 2016.  Tachycardia noted after patient visit. Would consider TSH at next visit if HR still elevated.   Phill Myron, D.O. 10/20/2017, 11:16 AM PGY-3, [place]

## 2017-10-21 LAB — LIPID PANEL
CHOL/HDL RATIO: 6.2 ratio — AB (ref 0.0–4.4)
CHOLESTEROL TOTAL: 266 mg/dL — AB (ref 100–199)
HDL: 43 mg/dL (ref >39)
LDL Calculated: 195 mg/dL — ABNORMAL HIGH (ref 0–99)
Triglycerides: 140 mg/dL (ref 0–149)
VLDL CHOLESTEROL CAL: 28 mg/dL (ref 5–40)

## 2017-10-21 LAB — MICROALBUMIN / CREATININE URINE RATIO
CREATININE, UR: 319.7 mg/dL
MICROALBUM., U, RANDOM: 118.9 ug/mL
Microalb/Creat Ratio: 37.2 mg/g creat — ABNORMAL HIGH (ref 0.0–30.0)

## 2017-10-23 ENCOUNTER — Encounter: Payer: Self-pay | Admitting: Pharmacist

## 2017-10-23 ENCOUNTER — Ambulatory Visit (INDEPENDENT_AMBULATORY_CARE_PROVIDER_SITE_OTHER): Payer: Self-pay | Admitting: Pharmacist

## 2017-10-23 DIAGNOSIS — I10 Essential (primary) hypertension: Secondary | ICD-10-CM

## 2017-10-23 DIAGNOSIS — E119 Type 2 diabetes mellitus without complications: Secondary | ICD-10-CM

## 2017-10-23 MED ORDER — GLUCOSE BLOOD VI STRP: ORAL_STRIP | 12 refills | Status: DC

## 2017-10-23 MED ORDER — AGAMATRIX PRESTO PRO METER DEVI: 1.0000 | Freq: Once | 0 refills | Status: AC

## 2017-10-23 MED ORDER — EMPAGLIFLOZIN 10 MG PO TABS: 10.0000 mg | ORAL_TABLET | Freq: Every day | ORAL | 2 refills | Status: DC

## 2017-10-23 MED ORDER — LISINOPRIL 20 MG PO TABS
20.0000 mg | ORAL_TABLET | Freq: Every day | ORAL | 3 refills | Status: DC
Start: 2017-10-23 — End: 2018-10-15

## 2017-10-23 MED ORDER — GLIPIZIDE ER 5 MG PO TB24: 5.0000 mg | ORAL_TABLET | Freq: Every day | ORAL | 1 refills | Status: DC

## 2017-10-23 NOTE — Patient Instructions (Addendum)
Thank you for coming into the clinic today!  Remember to reduce the amount of carbohydrates (potatoes, rice, bread, starches) and sugar (juice, soda) in your diet!  Keep up the exercise you mentioned including cardio with the stair stepper and treadmill (5 times a week). Great job!   **Increase your lisinopril from 10 mg once daily to 20 mg once daily (two pills).   **Start testing your blood sugar through the meter you will get through the Health Department. We want your blood sugar to be in the 100-200 mg/dL range. If you test your blood sugar and it is less than (<) 80 mg/dL then eat some candy or juice to raise your blood sugar.  **Start glipizide XL 5 mg once a day.   **In the future you will start a medication called Jardiance 10 mg once daily.   Our clinic will contact the [facility] Department to set you up for a program to help cover your medications.   Call our clinic and schedule an appointment in a month (early May) when you get your medication.  Enjoy your time at the beach!

## 2017-10-23 NOTE — Progress Notes (Signed)
S:    Patient arrives ambulating without assistance with a flat affect.  Presents for diabetes evaluation, education, and management at the request of Dr. Juleen China. Patient was referred on 10/20/17.  Patient was last seen by Primary Care Provider on 10/20/17. Last seen by Rx clinic on 06/29/2015 for HTN management.   Patient reports Diabetes was diagnosed at least 8 years ago.  Patient reports nocturia once per night. Denies other s/sx of hyperglycemia including polydipsia, polyphagia, or polyuria. Failed Metformin 500 mg due to severe diarrhea. Not on any current anti-hyperglycemic medications. PMH includes long standing uncontrolled HTN.   Family/Social History: Father dx with DM. Works as a Teacher, adult education. Has two children (7 yo and 27 yo). Recently found out she is going to be a grandmother which she states increases her desire to get her "health in check".   Patient reports adherence with current medications but denies adherence in the past.  Current diabetes medications include: none Current hypertension medications include: lisinopril 10 mg once daily, hydrochlorothiazide 25 mg once daily.  Insurance: Not currently insured. Currently pays out of pocket for prescription medications that create barriers for optimal medication therapy  Patient reported dietary habits: Eats 3 meals/day. Breakfast:steak & egg biscuit, hash brown, and OJ. Lunch: Salad (cheese, cucumbers, croutons, bacon/ham/turkey bits, and french/ranch dressing).  Fruits: Liked grapes. Vegetables: Likes collard greens and cabbage. Drinks: apple juice, orange juice, and ginger ale. Denies drinking soda but loves drinking Cheerwine.  Patient reported exercise habits: works out at a gym 3 - 4 times a week with up to an hour and half of different cardio workouts including stair stepper and treadmill.   Reports a fear of needles and at this time refuses treatment involving needles.   O:  Lab Results  Component Value Date     HGBA1C 11.2 10/20/2017   Urine Albumin/Creatinine Ratio = 37.2   Lipid Panel  TC = 266 mg/dL LDL = 195 mg/dL  HDL = 43 mg/dL  Home fasting CBG: none (pt does not have a working home meter) 2 hour post-prandial/random CBG: none (pt does not have a working home meter)  10 year ASCVD risk: >15%. ACC ASCVD Risk Estimator Plus only estimates 10 yr ASCVD risk for pts > 26 yo.  A/P: Diabetes longstanding currently not on any medication therapy and uncontrolled. Control is suboptimal due to a lack of current medication therapy, lack of prescription drug coverage, metabolic syndrome, and dietary indiscretion.  Advised patient to sign up for the [place] (MAP).  Initiated Glipizide XL 5 mg once daily. Sent a prescription for a SMBG meter and strips to [facility] for patient to start self monitor blood glucose at home. Counseled on appropriate hypoglycemia management plan if BG readings are < 80 mg/dL.  Start Jardiance (empagliflozin) 10 mg once daily when available from [facility].   Next A1C anticipated June 2019.    ASCVD risk greater than 7.5%. Consider starting Aspirin 81 mg and atorvastatin 40 mg at next visit. LDL > 190 mg/dL should be treated with a high intensity statin.    Hypertension longstanding currently uncontrolled.  Patient reports recent adherence with medication. Control is suboptimal due to ineffective dose and lack of treatment. Increase lisinopril dose to 20 mg once daily. Consider an addition of a CCB such as amlodipine at next visit if BP is still above goal (< 130/80 mmHg).   Counseled on dietary and exercise/lifestyle interventions for weight loss which can help with reducing  both her blood sugar and blood pressure.   Written patient instructions provided.  Total time in face to face counseling 105 minutes.   Follow up in Pharmacist Clinic Visit in 4-6 weeks.   Patient seen with Hildred Alamin, PharmD Candidate

## 2017-10-23 NOTE — Assessment & Plan Note (Signed)
Hypertension longstanding currently uncontrolled.  Patient reports recent adherence with medication. Control is suboptimal due to ineffective dose and lack of treatment. Increase lisinopril dose to 20 mg once daily. Consider an addition of a CCB such as amlodipine at next visit if BP is still above goal (< 130/80 mmHg).

## 2017-10-23 NOTE — Assessment & Plan Note (Signed)
Diabetes longstanding currently not on any medication therapy and uncontrolled. Control is suboptimal due to a lack of current medication therapy, lack of prescription drug coverage, metabolic syndrome, and dietary indiscretion.  Advised patient to sign up for the [place] (MAP).  Initiated Glipizide XL 5 mg once daily. Sent a prescription for a SMBG meter and strips to [facility] for patient to start self monitor blood glucose at home. Counseled on appropriate hypoglycemia management plan if BG readings are < 80 mg/dL.  Start Jardiance (empagliflozin) 10 mg once daily when available from [facility].

## 2017-10-30 ENCOUNTER — Other Ambulatory Visit: Payer: Self-pay

## 2017-10-30 ENCOUNTER — Encounter: Payer: Self-pay | Admitting: Internal Medicine

## 2017-10-30 ENCOUNTER — Ambulatory Visit (INDEPENDENT_AMBULATORY_CARE_PROVIDER_SITE_OTHER): Payer: Self-pay | Admitting: Internal Medicine

## 2017-10-30 VITALS — BP 110/78 | HR 89 | Temp 98.2°F | Ht 59.0 in | Wt 203.0 lb

## 2017-10-30 DIAGNOSIS — E785 Hyperlipidemia, unspecified: Secondary | ICD-10-CM

## 2017-10-30 DIAGNOSIS — E119 Type 2 diabetes mellitus without complications: Secondary | ICD-10-CM

## 2017-10-30 DIAGNOSIS — I10 Essential (primary) hypertension: Secondary | ICD-10-CM

## 2017-10-30 DIAGNOSIS — E1169 Type 2 diabetes mellitus with other specified complication: Secondary | ICD-10-CM | POA: Insufficient documentation

## 2017-10-30 NOTE — Progress Notes (Signed)
   Subjective:    [name] - 41 y.o. female MRN [id number]  Date of birth: 01/22/1976  HPI  [name] is here for follow up of chronic medical conditions.  HTN: Seen by Pharmacy clinic on 4/1. Lisinopril was increased from 10 mg to 20 mg daily due to persistent uncontrolled hypertension. Patient reports compliance with HCTZ 25 mg and Lisinopril 20 mg daily.  Denies severe headaches, vision changes, chest pain.  T2DM: Last A1c 11.2% on 3/29. Patient seen by pharmacy clinic and started on Glipizide XL 5 mg. She was instructed to pick up diabetic supplies to monitor glucose. Unable to do so last week due to an acute illness. She plans to go to pharmacy today. Attempting to set her up with [facility] in order to obtain Jardiance.   Hyperlipidemia: Lipid panel checked at previous office visit showed elevated cholesterol. Patient wishes to defer medication therapy at present. Wants to work on one health goal at a time and wishes to focus on diabetes.    -  reports that she has never smoked. She has never used smokeless tobacco. - Review of Systems: Per HPI. - Past Medical History: Patient Active Problem List   Diagnosis Date Noted  . Hyperlipidemia 10/30/2017  . Obstructive sleep apnea 06/30/2015  . Dyspnea 06/29/2015  . Missed period 04/05/2013  . Anemia due to blood loss, chronic 06/27/2012  . Allergic rhinitis 06/27/2012  . Major depressive disorder 02/08/2012  . Hypertension 12/04/2011  . Dysmenorrhea 11/22/2011  . Diabetes mellitus, type 2 ([place]) 05/19/2011  . PCOS (polycystic ovarian syndrome) 05/19/2011  . Hydrosalpinx 08/21/2009   - Medications: reviewed and updated   Objective:   Physical Exam BP 110/78   Pulse 89   Temp 98.2 F (36.8 C) (Oral)   Ht 4\' 11"  (1.499 m)   Wt 203 lb (92.1 kg)   SpO2 98%   BMI 41.00 kg/m  Gen: NAD, alert, cooperative with exam, well-appearing Psych: good insight, alert and oriented    Assessment & Plan:    Hypertension Hypertension longstanding currently at goal today. Will not adjust medication regimen. Continue to monitor at home. Follow up with pharmacy clinic in May.   Diabetes mellitus, type 2 Diabetes longstanding currently uncontrolled with most recent A1c >11%. Patient reports that she has started Glipizide 5 mg daily. Discussed importance of beginning to monitor CBGs at home due to potential of hypoglycemia. Eventual plan is to add Jardiance but currently financial hardships are a barrier. Return in 1 month.   Hyperlipidemia Noted to have elevated Total Cholesterol with result of 266 and elevated LDL with result of 195. Patient's 10 year ASCVD risk >15% (assuming age of 24). Recommended starting ASA and Lipitor 40 mg. Patient wishes to defer therapy for now but will consider as her other co-morbidities become more controlled. Discussed diet modifications for cholesterol levels.     Phill Myron, D.O. 10/30/2017, 1:30 PM PGY-3, [place]

## 2017-10-30 NOTE — Patient Instructions (Signed)
Thank you for coming in today!  Your blood pressure looks wonderful. Continue taking the HCTZ 25 mg and Lisinopril 20 mg.  I am sorry to hear you were not feeling well this past week. I am glad you have started the Glipizide. Make sure to pick up your diabetic supplies to start checking your blood sugars as this medicine can make them low.  Keep in the back of your mind that we talked about medicine for your cholesterol to reduce your risk of having a heart attack or stroke.   You are doing a wonderful job and I'm proud of you! Enjoy your birthday and beach trip!   Please follow up with Dr. Valentina Lucks in early May.

## 2017-10-30 NOTE — Assessment & Plan Note (Signed)
Diabetes longstanding currently uncontrolled with most recent A1c >11%. Patient reports that she has started Glipizide 5 mg daily. Discussed importance of beginning to monitor CBGs at home due to potential of hypoglycemia. Eventual plan is to add Jardiance but currently financial hardships are a barrier. Return in 1 month.

## 2017-10-30 NOTE — Assessment & Plan Note (Signed)
Noted to have elevated Total Cholesterol with result of 266 and elevated LDL with result of 195. Patient's 10 year ASCVD risk >15% (assuming age of 35). Recommended starting ASA and Lipitor 40 mg. Patient wishes to defer therapy for now but will consider as her other co-morbidities become more controlled. Discussed diet modifications for cholesterol levels.

## 2017-10-30 NOTE — Assessment & Plan Note (Signed)
Hypertension longstanding currently at goal today. Will not adjust medication regimen. Continue to monitor at home. Follow up with pharmacy clinic in May.

## 2018-10-15 ENCOUNTER — Telehealth: Payer: Self-pay | Admitting: Family Medicine

## 2018-10-15 ENCOUNTER — Other Ambulatory Visit: Payer: Self-pay | Admitting: *Deleted

## 2018-10-15 DIAGNOSIS — I10 Essential (primary) hypertension: Secondary | ICD-10-CM

## 2018-10-15 DIAGNOSIS — E119 Type 2 diabetes mellitus without complications: Secondary | ICD-10-CM

## 2018-10-15 MED ORDER — LISINOPRIL 20 MG PO TABS: 20.0000 mg | ORAL_TABLET | Freq: Every day | ORAL | 3 refills | Status: DC

## 2018-10-15 MED ORDER — GLIPIZIDE ER 5 MG PO TB24: 5.0000 mg | ORAL_TABLET | Freq: Every day | ORAL | 3 refills | Status: DC

## 2018-10-15 MED ORDER — HYDROCHLOROTHIAZIDE 25 MG PO TABS: 25.0000 mg | ORAL_TABLET | Freq: Every day | ORAL | 3 refills | Status: DC

## 2018-10-15 NOTE — Telephone Encounter (Signed)
Received medication request for patient was not been clinic in over 1 year.  She does have diabetes and her last test was an A1c in March 2019 which was over 11.  I called and left a message with the patient that said that I would be refilling the medication at their request but that I was concerned that we have not had any interaction with her for over a year and has some of her conditions may need to be reevaluated in terms of their medical management.  I was clear that I was not specifically asked her to come into the office due to the coronavirus concern at the moment but that it would be appropriate for her to call and only speak to someone about how she has been doing.  Dr. Criss Rosales

## 2018-12-25 ENCOUNTER — Ambulatory Visit (INDEPENDENT_AMBULATORY_CARE_PROVIDER_SITE_OTHER): Payer: Self-pay | Admitting: Family Medicine

## 2018-12-25 ENCOUNTER — Other Ambulatory Visit: Payer: Self-pay

## 2018-12-25 VITALS — BP 165/120 | HR 122 | Wt 214.6 lb

## 2018-12-25 DIAGNOSIS — R601 Generalized edema: Secondary | ICD-10-CM

## 2018-12-25 DIAGNOSIS — I10 Essential (primary) hypertension: Secondary | ICD-10-CM

## 2018-12-25 DIAGNOSIS — E119 Type 2 diabetes mellitus without complications: Secondary | ICD-10-CM

## 2018-12-25 DIAGNOSIS — N946 Dysmenorrhea, unspecified: Secondary | ICD-10-CM

## 2018-12-25 LAB — POCT GLYCOSYLATED HEMOGLOBIN (HGB A1C): HbA1c, POC (controlled diabetic range): 10.5 % — AB (ref 0.0–7.0)

## 2018-12-25 MED ORDER — BACLOFEN 10 MG PO TABS: 5.0000 mg | ORAL_TABLET | Freq: Two times a day (BID) | ORAL | 0 refills | Status: DC | PRN

## 2018-12-25 MED ORDER — FUROSEMIDE 20 MG PO TABS: 20.0000 mg | ORAL_TABLET | Freq: Every day | ORAL | 0 refills | Status: DC

## 2018-12-25 MED ORDER — ALBUTEROL SULFATE HFA 108 (90 BASE) MCG/ACT IN AERS: 2.0000 | INHALATION_SPRAY | Freq: Four times a day (QID) | RESPIRATORY_TRACT | 1 refills | Status: DC | PRN

## 2018-12-25 NOTE — Patient Instructions (Addendum)
Refilled albuterol Checking bloodwork Start lasix - medication to help remove fluid Monitor blood pressure at home - goal is under 140/90 Follow up in 1 week to recheck blood pressure and see how you are doing  Be well, Dr. Ardelia Mems

## 2018-12-25 NOTE — Progress Notes (Signed)
Date of Visit: 12/25/2018   HPI:  Patient presents for a same day appointment to discuss hypertension and edema.  Currently taking lisinopril 20mg  daily and HCTZ 25mg  daily for hypertension. For the last several weeks her blood pressure has been quite elevated at home. Getting 170s/110s consistently at home. Over the same period of time, has had increased swelling in her legs. No shortness of breath. No fever, no cough, no chest pain. Reports long history of poorly controlled blood pressures.  Has also had some back pain. Describes as muscle tenderness over bilateral lower back. Able to walk around and do her normal activities without shortness of breath. No bowel or bladder incontinence.   She was very worried her blood pressure elevation and swelling could be due to COVID. Reports she dislikes doctors offices and was very stressed about coming in today, thinks that is why her heart rate is up today.  Regarding diabetes - it appears she is taking glipizide XL 5mg  daily. Does not check sugar at home. Reports diabetes has always been poorly controlled. Well past due for follow up & A1c.  Is one day late for her period. Sexually active not on birth control. Unable to give urine specimen today as she does not need to urinate.  ROS: See HPI  [place]: history of hydrosalpinx, type 2 diabetes, hypertension, hyperlipidemia, PCOS, OSA, anemia  PHYSICAL EXAM: BP (!) 165/120   Pulse (!) 122   Wt 214 lb 9.6 oz (97.3 kg)   SpO2 99%   BMI 43.34 kg/m  Gen: no acute distress, pleasant, cooperative, well appearing HEENT: normocephalic, atraumatic, moist mucous membranes  Heart: regular rhythm with occasional sinus pause, no murmur Lungs: clear to auscultation bilaterally, normal work of breathing  Neuro: alert, grossly nonfocal, speech normal Extremities: 2+ lower extremity edema bilaterally  ASSESSMENT/PLAN:  Hypertension Uncontrolled. With significant lower extremity edema but no dyspnea or  chest pain to raise concern for ACS or florid CHF. Does have history of OSA on problem list so could have some component of pulm hypertension/R heart failure though would still expect this to have some cardiac/pulmonary symptoms other than edema and hypertension. - check labs today - CBC, CMET, A1C, BNP, TSH - add lasix 20mg  daily to remove fluid, will likely help control blood pressure - advised to keep checking blood pressure at home, if persistently above 160/110 to call us and we would add another blood pressure agent - close follow up in 1 week in office, sooner if worsening - consider ordering & scheduling echo at follow up visit  Diabetes mellitus, type 2 Historically uncontrolled, currently only on glipizide Check A1c with labs, advised patient to follow up more regularly regarding diabetes    Suspect tachycardia related to stress of being in doctor's office as patient was very nervous about this. Reassured her that symptoms do not sound typical for COVID. Has no resp symptoms, cough, or fever.  Refill albuterol inhaler at patient's request for history of asthma (again, no shortness of breath recently or at this time)  Back pain mild & musculoskeletal without red flags. Will try low dose baclofen for relief, counseled on risk of excess sedation given OSA.   FOLLOW UP: Follow up in 1 week for above issues.  [place]. Ardelia Mems, [place]

## 2018-12-26 LAB — CMP14+EGFR
ALT: 40 IU/L — ABNORMAL HIGH (ref 0–32)
AST: 59 IU/L — ABNORMAL HIGH (ref 0–40)
Albumin/Globulin Ratio: 0.9 — ABNORMAL LOW (ref 1.2–2.2)
Albumin: 3.4 g/dL — ABNORMAL LOW (ref 3.8–4.8)
Alkaline Phosphatase: 113 IU/L (ref 39–117)
BUN/Creatinine Ratio: 16 (ref 9–23)
BUN: 15 mg/dL (ref 6–24)
Bilirubin Total: 1.9 mg/dL — ABNORMAL HIGH (ref 0.0–1.2)
CO2: 20 mmol/L (ref 20–29)
Calcium: 9.2 mg/dL (ref 8.7–10.2)
Chloride: 97 mmol/L (ref 96–106)
Creatinine, Ser: 0.91 mg/dL (ref 0.57–1.00)
GFR calc Af Amer: 91 mL/min/{1.73_m2} (ref >59)
GFR calc non Af Amer: 79 mL/min/{1.73_m2} (ref >59)
Globulin, Total: 3.7 g/dL (ref 1.5–4.5)
Glucose: 309 mg/dL — ABNORMAL HIGH (ref 65–99)
Potassium: 4.3 mmol/L (ref 3.5–5.2)
Sodium: 134 mmol/L (ref 134–144)
Total Protein: 7.1 g/dL (ref 6.0–8.5)

## 2018-12-26 LAB — TSH: TSH: 2.52 u[IU]/mL (ref 0.450–4.500)

## 2018-12-26 LAB — BETA HCG QUANT (REF LAB): hCG Quant: <1 m[IU]/mL

## 2018-12-26 LAB — CBC
Hematocrit: 38.3 % (ref 34.0–46.6)
Hemoglobin: 11.2 g/dL (ref 11.1–15.9)
MCH: 22.8 pg — ABNORMAL LOW (ref 26.6–33.0)
MCHC: 29.2 g/dL — ABNORMAL LOW (ref 31.5–35.7)
MCV: 78 fL — ABNORMAL LOW (ref 79–97)
Platelets: 338 10*3/uL (ref 150–450)
RBC: 4.92 x10E6/uL (ref 3.77–5.28)
RDW: 14.8 % (ref 11.7–15.4)
WBC: 11.6 10*3/uL — ABNORMAL HIGH (ref 3.4–10.8)

## 2018-12-26 LAB — BRAIN NATRIURETIC PEPTIDE: BNP: 401.2 pg/mL — ABNORMAL HIGH (ref 0.0–100.0)

## 2018-12-26 NOTE — Assessment & Plan Note (Signed)
Historically uncontrolled, currently only on glipizide Check A1c with labs, advised patient to follow up more regularly regarding diabetes

## 2018-12-26 NOTE — Assessment & Plan Note (Addendum)
Uncontrolled. With significant lower extremity edema but no dyspnea or chest pain to raise concern for ACS or florid CHF. Does have history of OSA on problem list so could have some component of pulm hypertension/R heart failure though would still expect this to have some cardiac/pulmonary symptoms other than edema and hypertension. - check labs today - CBC, CMET, A1C, BNP, TSH - add lasix 20mg  daily to remove fluid, will likely help control blood pressure - advised to keep checking blood pressure at home, if persistently above 160/110 to call us and we would add another blood pressure agent - close follow up in 1 week in office, sooner if worsening - consider ordering & scheduling echo at follow up visit

## 2018-12-27 ENCOUNTER — Other Ambulatory Visit: Payer: Self-pay

## 2018-12-27 ENCOUNTER — Telehealth (INDEPENDENT_AMBULATORY_CARE_PROVIDER_SITE_OTHER): Payer: Self-pay | Admitting: Student in an Organized Health Care Education/Training Program

## 2018-12-27 ENCOUNTER — Telehealth: Payer: Self-pay

## 2018-12-27 DIAGNOSIS — R110 Nausea: Secondary | ICD-10-CM

## 2018-12-27 DIAGNOSIS — R601 Generalized edema: Secondary | ICD-10-CM

## 2018-12-27 NOTE — Assessment & Plan Note (Signed)
Nausea and vomiting may be due to intolerance to new medications. Discussed volume status and the potential that she has heart failure.  - continue furosemide - discontinue baclofen to see if symptoms improve - follow up tomorrow for virtual visit to see if her nausea and vomiting improve - if her condition remains the same and she is unable to tolerate lasix, she may need to come into office or ED for cardiac workup; EKG, echo, and IV diuresis - if her nausea improves with discontinuation of baclofen, would continue to monitor. Would consider echo at follow up. Recheck BP at follow up and consider [place] HCTZ if she continues lasix. There is also room to increase her lisinopril if needed for further BP control.  - strict call back/return precautions were discussed if she develops shortness of breath, intractable vomiting or inability to keep down fluids, or other red flag symptoms

## 2018-12-27 NOTE — Telephone Encounter (Signed)
Pt calling to get results from blood work taken Tuesday. Please call [phone]. Ottis Stain, CMA

## 2018-12-27 NOTE — Telephone Encounter (Signed)
Called patient. Also reviewed note from telemedicine visit today - patient seen by Dr. Burr Medico for nausea. Recommended stopping baclofen. I agree with this - nausea is a known side effect of baclofen. Patient reports she has kept a bottle of water down today.  Labs showed BNP elevated at 400 - concerning for CHF. Will obtain echo.  Labs also showed mildly eelvated bilirubin and AST/ALT. Will plan to recheck when she follows up with me next week, has appointment scheduled on Tuesday.  Has not had any increase in urine output since starting lasix. Given her vomiting, I hesitate to increase it further. She denies any dry mouth. Edema remains.   She has follow up virtual appointment scheduled tomorrow afternoon. Encouraged her to call us earlier in the AM if she is not feeling better, as I think at that point she would need to be seen in clinic to have vitals checked and labs redrawn. I don't think she is in DKA but warrants close follow up if not improved tomorrow after stopping baclofen.   Patient agreeable to this plan and appreciative.  Leeanne Rio, MD

## 2018-12-27 NOTE — Progress Notes (Signed)
[facility] Telemedicine Visit  Patient consented to have virtual visit. Method of visit: Video  Encounter participants: Patient: [name] - located at Home Provider: Everrett Coombe - located at [facility] Others (if applicable):   Chief Complaint: Nausea  HPI: Patient was in her usual state of health until 2 days ago when she presented to [facility] for elevated BP and LE edema. Labs were drawn at that visit notable for elevated BNP at 401.   Patient was prescribed 20 mg lasix for volume overload and HTN in addition to antihypertensives including HCTZ and lisinopril. She was also prescribed baclofen for MSK back pain.  Today, patient reports that after taking her baclofen and furosemide on 6/2, she developed nausea. She has had multiple episodes of nonbloody emesis. She has had difficulty keeping down any food since that time. She has been taking her medications as prescribed. She has been able to keep down fluids. She has not taken her meds yet today.  No fevers, diarrhea, or constipation. She has had mildly increased urination since starting the fluid pill.  Swelling persists and has not improved or worsened. Her breathing is at baseline; she is speaking in full sentences and does not complain of shortness of breath.  ROS: per HPI  Pertinent PMHx: T2DM, OSA, HTN, MDD, HLD  Exam:  Respiratory: speaks in full sentences  Assessment/Plan:  Nausea Nausea and vomiting may be due to intolerance to new medications. Discussed volume status and the potential that she has heart failure.  - continue furosemide - discontinue baclofen to see if symptoms improve - follow up tomorrow for virtual visit to see if her nausea and vomiting improve - if her condition remains the same and she is unable to tolerate lasix, she may need to come into office or ED for cardiac workup; EKG, echo, and IV diuresis - if her nausea improves with discontinuation of baclofen, would continue to  monitor. Would consider echo at follow up. Recheck BP at follow up and consider [place] HCTZ if she continues lasix. There is also room to increase her lisinopril if needed for further BP control.  - strict call back/return precautions were discussed if she develops shortness of breath, intractable vomiting or inability to keep down fluids, or other red flag symptoms     Time spent during visit with patient: 15 minutes

## 2018-12-28 ENCOUNTER — Other Ambulatory Visit: Payer: Self-pay

## 2018-12-28 ENCOUNTER — Telehealth: Payer: Self-pay

## 2018-12-28 ENCOUNTER — Telehealth (INDEPENDENT_AMBULATORY_CARE_PROVIDER_SITE_OTHER): Payer: Self-pay | Admitting: Family Medicine

## 2018-12-28 DIAGNOSIS — R110 Nausea: Secondary | ICD-10-CM

## 2018-12-28 DIAGNOSIS — F419 Anxiety disorder, unspecified: Secondary | ICD-10-CM

## 2018-12-28 DIAGNOSIS — F411 Generalized anxiety disorder: Secondary | ICD-10-CM

## 2018-12-28 MED ORDER — HYDROXYZINE HCL 10 MG PO TABS: 10.0000 mg | ORAL_TABLET | Freq: Three times a day (TID) | ORAL | 0 refills | Status: DC | PRN

## 2018-12-28 NOTE — Progress Notes (Signed)
[facility] Telemedicine Visit  Patient consented to have virtual visit. Method of visit: Telephone  Encounter participants: Patient: [name] - located at Home Provider: Nuala Alpha - located at [facility] Others (if applicable): none  Chief Complaint: Nausea w/vomiting, with associated anxiety and poor sleep.  HPI: Patient is being seen via telemedicine today for follow up due to originally elevated BP and worsening lower extremity edema. She was seen via telemedicine by Dr. Burr Medico on 6/4 and given lasix 20mg  to help lower her BP and help with the LE edema. She states today her legs are "not worse but not much better". She was also recently taking Baclofen which was thought was the cause of her recent nausea and vomiting. The main concern was that if she is vomiting up the lasix it may not be helping her and she could become fluid overloaded. She has taken the lasix today and states she has been urinating regularly. She denies any shortness of breath or difficulty breathing. She states she threw up "just a little bit today" but has kept down liquids. She is still somewhat nauseated but zofran helps.   Patient has new compliant of increased anxiety and poor sleep. She states "with everything going on" it has been very difficult to initiate sleep and when she wakes up she has difficulty going back to sleep. She states she usually does not have anxiety and is desperate to try something to help her calm down. She has tried taking OTC melatonin for sleep with no help.   Patient was made aware of her Echocardiogram scheduled for Monday 6/8 @10am  at [facility]  No headaches, vision changes, No diarrhea or constipation.  ROS: per HPI  Pertinent PMHx:    Exam:  Respiratory: Speaks in full sentences without needing to stop and catch her breath  Assessment/Plan:  Nausea Stopping baclofen and zofran seem to be helping. Patient agrees to continue challenging herself  with liquids (water, gatorade, soups) and bland foods. She agrees to call back this afternoon before 5pm if she can no longer keep down liquids. Discussed reasons to go to the ED such as continued vomiting and unable to take medications  Anxiety Patient experiencing anxiety and poor sleep from a variety of sources. She is concerned about her health, concerned about the current pandemic, and also about the current social climate and how that affects her here in the [place]. She is having poor sleep and this seems to be compounding her anxiety. - Hydroxyzine 10mg  once at bedtime as needed for anxiety and sleep. We discussed this as a temporary solution and she should not to be on it long term. We also discussed that after she takes this she should not drive as it will make her very sleepy. Patient expressed agreement and understanding.    Time spent during visit with patient: >15 minutes

## 2018-12-28 NOTE — Telephone Encounter (Signed)
Called patient to inform her of appointment for Echocardiogram at [facility] 12/31/2018 at 1000. Patient is to go to admission to be checked in.  No answer and voice mail is full.  [place], patien has a virtual visit with Dr. Garlan Fillers ATC today  12/28/2018 at 1300.  Will inform patient at appointment.  Ozella Almond, [place]

## 2018-12-31 ENCOUNTER — Other Ambulatory Visit: Payer: Self-pay | Admitting: Family Medicine

## 2018-12-31 ENCOUNTER — Ambulatory Visit (HOSPITAL_COMMUNITY)
Admission: RE | Admit: 2018-12-31 | Discharge: 2018-12-31 | Disposition: A | Discharge status: Home / Self Care | Payer: Self-pay | Source: Ambulatory Visit | Attending: Family Medicine | Admitting: Family Medicine

## 2018-12-31 ENCOUNTER — Ambulatory Visit (INDEPENDENT_AMBULATORY_CARE_PROVIDER_SITE_OTHER): Payer: Self-pay | Admitting: Family Medicine

## 2018-12-31 ENCOUNTER — Other Ambulatory Visit: Payer: Self-pay

## 2018-12-31 ENCOUNTER — Encounter: Payer: Self-pay | Admitting: Family Medicine

## 2018-12-31 ENCOUNTER — Telehealth: Payer: Self-pay

## 2018-12-31 ENCOUNTER — Telehealth: Payer: Self-pay | Admitting: Family Medicine

## 2018-12-31 VITALS — BP 139/98 | HR 114 | Temp 98.5°F | Wt 212.0 lb

## 2018-12-31 DIAGNOSIS — I5020 Unspecified systolic (congestive) heart failure: Secondary | ICD-10-CM

## 2018-12-31 DIAGNOSIS — R17 Unspecified jaundice: Secondary | ICD-10-CM

## 2018-12-31 DIAGNOSIS — I4891 Unspecified atrial fibrillation: Secondary | ICD-10-CM | POA: Insufficient documentation

## 2018-12-31 DIAGNOSIS — R601 Generalized edema: Secondary | ICD-10-CM | POA: Insufficient documentation

## 2018-12-31 DIAGNOSIS — F0630 Mood disorder due to known physiological condition, unspecified: Secondary | ICD-10-CM | POA: Insufficient documentation

## 2018-12-31 DIAGNOSIS — I5042 Chronic combined systolic (congestive) and diastolic (congestive) heart failure: Secondary | ICD-10-CM | POA: Insufficient documentation

## 2018-12-31 DIAGNOSIS — F419 Anxiety disorder, unspecified: Secondary | ICD-10-CM | POA: Insufficient documentation

## 2018-12-31 DIAGNOSIS — I480 Paroxysmal atrial fibrillation: Secondary | ICD-10-CM | POA: Insufficient documentation

## 2018-12-31 DIAGNOSIS — I081 Rheumatic disorders of both mitral and tricuspid valves: Secondary | ICD-10-CM | POA: Insufficient documentation

## 2018-12-31 DIAGNOSIS — R931 Abnormal findings on diagnostic imaging of heart and coronary circulation: Secondary | ICD-10-CM

## 2018-12-31 MED ORDER — RIVAROXABAN 20 MG PO TABS: 20.0000 mg | ORAL_TABLET | Freq: Every day | ORAL | 0 refills | Status: DC

## 2018-12-31 MED ORDER — EPINEPHRINE 0.3 MG/0.3ML IJ SOAJ: 0.3000 mg | Freq: Once | INTRAMUSCULAR | 0 refills | Status: AC

## 2018-12-31 MED ORDER — CARVEDILOL 3.125 MG PO TABS: 3.1250 mg | ORAL_TABLET | Freq: Two times a day (BID) | ORAL | 1 refills | Status: DC

## 2018-12-31 MED ORDER — FUROSEMIDE 40 MG PO TABS: ORAL_TABLET | ORAL | 1 refills | Status: DC

## 2018-12-31 MED ORDER — RIVAROXABAN 20 MG PO TABS: 20.0000 mg | ORAL_TABLET | Freq: Every day | ORAL | 1 refills | Status: DC

## 2018-12-31 NOTE — Assessment & Plan Note (Signed)
New diagnosis of HFrEF based on today's echo with LV EF of 20-25%. Echo also noted new onset afib, which is not demonstrated on EKG today in-office. EKG without findings of acute ischemia on my read. Discussed patient with cardiologist on call, Dr. Irish Lack, by phone. Patient is not in florid CHF as she has no real respiratory symptoms, mainly just edema and hypertension.  Plan after discussion w/ Dr. Irish Lack: - add carvedilol 3.125mg  twice daily to help lower blood pressure and heart rate - start xarelto for atrial fibrillation - gave patient sample of 7 day supply and sent in new rx to her pharmacy  - increase lasix to 40mg  twice daily for 3 days then go down to 40mg  daily - appointment scheduled tomorrow AM with cardiologist Dr. Curt Bears, provided patient with appointment info

## 2018-12-31 NOTE — Patient Instructions (Signed)
Sent in 4 new medications: - epipen - just for you to have on hand - lasix - higher dose of fluid medicine, take twice daily for 3 days then once per day - xarelto - blood thinner, also giving you a 7 day sample from our clinic - carvedilol - medication to help lower your heart rate and help with blood pressure    Go tomorrow morning at 8:45 to see Dr. Curt Bears at [address], [place], [place] [address] [phone]  Please wear a face mask to the appointment     Back Exercises The following exercises strengthen the muscles that help to support the back. They also help to keep the lower back flexible. Doing these exercises can help to prevent back pain or lessen existing pain. If you have back pain or discomfort, try doing these exercises 2-3 times each day or as told by your health care provider. When the pain goes away, do them once each day, but increase the number of times that you repeat the steps for each exercise (do more repetitions). If you do not have back pain or discomfort, do these exercises once each day or as told by your health care provider. Exercises Single Knee to Chest Repeat these steps 3-5 times for each leg: 1. Lie on your back on a firm bed or the floor with your legs extended. 2. Bring one knee to your chest. Your other leg should stay extended and in contact with the floor. 3. Hold your knee in place by grabbing your knee or thigh. 4. Pull on your knee until you feel a gentle stretch in your lower back. 5. Hold the stretch for 10-30 seconds. 6. Slowly release and straighten your leg. Pelvic Tilt Repeat these steps 5-10 times: 1. Lie on your back on a firm bed or the floor with your legs extended. 2. Bend your knees so they are pointing toward the ceiling and your feet are flat on the floor. 3. Tighten your lower abdominal muscles to press your lower back against the floor. This motion will tilt your pelvis so your tailbone points up toward  the ceiling instead of pointing to your feet or the floor. 4. With gentle tension and even breathing, hold this position for 5-10 seconds. Cat-Cow Repeat these steps until your lower back becomes more flexible: 1. Get into a hands-and-knees position on a firm surface. Keep your hands under your shoulders, and keep your knees under your hips. You may place padding under your knees for comfort. 2. Let your head hang down, and point your tailbone toward the floor so your lower back becomes rounded like the back of a cat. 3. Hold this position for 5 seconds. 4. Slowly lift your head and point your tailbone up toward the ceiling so your back forms a sagging arch like the back of a cow. 5. Hold this position for 5 seconds.  Press-Ups Repeat these steps 5-10 times: 1. Lie on your abdomen (face-down) on the floor. 2. Place your palms near your head, about shoulder-width apart. 3. While you keep your back as relaxed as possible and keep your hips on the floor, slowly straighten your arms to raise the top half of your body and lift your shoulders. Do not use your back muscles to raise your upper torso. You may adjust the placement of your hands to make yourself more comfortable. 4. Hold this position for 5 seconds while you keep your back relaxed. 5. Slowly return to  lying flat on the floor.  Bridges Repeat these steps 10 times: 1. Lie on your back on a firm surface. 2. Bend your knees so they are pointing toward the ceiling and your feet are flat on the floor. 3. Tighten your buttocks muscles and lift your buttocks off of the floor until your waist is at almost the same height as your knees. You should feel the muscles working in your buttocks and the back of your thighs. If you do not feel these muscles, slide your feet 1-2 inches farther away from your buttocks. 4. Hold this position for 3-5 seconds. 5. Slowly lower your hips to the starting position, and allow your buttocks muscles to relax  completely. If this exercise is too easy, try doing it with your arms crossed over your chest. Abdominal Crunches Repeat these steps 5-10 times: 1. Lie on your back on a firm bed or the floor with your legs extended. 2. Bend your knees so they are pointing toward the ceiling and your feet are flat on the floor. 3. Cross your arms over your chest. 4. Tip your chin slightly toward your chest without bending your neck. 5. Tighten your abdominal muscles and slowly raise your trunk (torso) high enough to lift your shoulder blades a tiny bit off of the floor. Avoid raising your torso higher than that, because it can put too much stress on your low back and it does not help to strengthen your abdominal muscles. 6. Slowly return to your starting position. Back Lifts Repeat these steps 5-10 times: 1. Lie on your abdomen (face-down) with your arms at your sides, and rest your forehead on the floor. 2. Tighten the muscles in your legs and your buttocks. 3. Slowly lift your chest off of the floor while you keep your hips pressed to the floor. Keep the back of your head in line with the curve in your back. Your eyes should be looking at the floor. 4. Hold this position for 3-5 seconds. 5. Slowly return to your starting position. Contact a health care provider if:  Your back pain or discomfort gets much worse when you do an exercise.  Your back pain or discomfort does not lessen within 2 hours after you exercise. If you have any of these problems, stop doing these exercises right away. Do not do them again unless your health care provider says that you can. Get help right away if:  You develop sudden, severe back pain. If this happens, stop doing the exercises right away. Do not do them again unless your health care provider says that you can. This information is not intended to replace advice given to you by your health care provider. Make sure you discuss any questions you have with your health care  provider. Document Released: 08/18/2004 Document Revised: 11/14/2017 Document Reviewed: 09/04/2014 Elsevier Interactive Patient Education  [employer].

## 2018-12-31 NOTE — Assessment & Plan Note (Signed)
Patient experiencing anxiety and poor sleep from a variety of sources. She is concerned about her health, concerned about the current pandemic, and also about the current social climate and how that affects her here in the [place]. She is having poor sleep and this seems to be compounding her anxiety. - Hydroxyzine 10mg  once at bedtime as needed for anxiety and sleep. We discussed this as a temporary solution and she should not to be on it long term. We also discussed that after she takes this she should not drive as it will make her very sleepy. Patient expressed agreement and understanding.

## 2018-12-31 NOTE — Telephone Encounter (Signed)
DOD Call from Dr. Ardelia Mems reporting patient who had an echocardiogram earlier today that showed new EF 20-25% and Atrial Fibrillation on the echo. Dr. Ardelia Mems spoke with Dr. Irish Lack regarding her findings. Instructed for patient to start carvedilol 3.125 mg BID, increase lasix to 40 mg BID x 3 days, and start anticoag of PCP choice. Dr. Irish Lack requesting patient have in office visit tomorrow with Dr. Curt Bears (DOD). Appointment arranged for tomorrow at 9:30 AM. All Covid screening questions were negative. PCP OV notes and echo results in Epic.       COVID-19 Pre-Screening Questions:  . In the past 7 to 10 days have you had a cough,  shortness of breath, headache, congestion, fever (100 or greater) body aches, chills, sore throat, or sudden loss of taste or sense of smell? NO . Have you been around anyone with known Covid 19? NO . Have you been around anyone who is awaiting Covid 19 test results in the past 7 to 10 days? NO . Have you been around anyone who has been exposed to Covid 19, or has mentioned symptoms of Covid 19 within the past 7 to 10 days? NO

## 2018-12-31 NOTE — Progress Notes (Signed)
Refer to HF clinic for EF 20-25%

## 2018-12-31 NOTE — Assessment & Plan Note (Signed)
New diagnosis of HFrEF 20-25%, will be referring to see cardiology.

## 2018-12-31 NOTE — Telephone Encounter (Signed)
Reviewed today's echo results which showed severely reduced EF and new onset atrial fibrillation. Immediately contacted patient by phone. She reports she is feeling alright. Briefly discussed echo results and advised I need to see her today, rather than tomorrow AM as is currently scheduled. The soonest she can get here today is 3:30- appointment scheduled for that time. Will get EKG, vitals, perform another exam, and discuss further evaluation during in-office visit.  Leeanne Rio, MD

## 2018-12-31 NOTE — Progress Notes (Signed)
Received ECHO results showing 20-25% EF in new diagnosis.   HFrEF (heart failure with reduced ejection fraction) ([place]) New diagnosis of HFrEF 20-25%, will be referring to see cardiology.  -[name]

## 2018-12-31 NOTE — Progress Notes (Signed)
Date of Visit: 12/31/2018   HPI:  Patient presents for follow up of abnormal echo.  I saw her last week for elevated blood pressures and edema, also low back pain. Started her on lasix 20mg  daily, obtained labs which were remarkable for elevated bilirubin and BNP of 400. Also did baclofen for back pain.  Had echo done earlier today, which demonstrated: - EF 20-25% - unable to assess diastolic dysfunction due to afib - moderately reduced R ventricular systolic dysfunction  Patient reports she is still having lots of swelling, not any better on lasix. Is not urinating much more than normal on lasix 20mg  daily. No chest pain or significant breathing issues. She is able to walk without chest pain or shortness of breath. Last week she had had some nausea and vomiting which was thought maybe to be due to baclofen which she has stopped. No vomiting yesterday or today. Not checking blood sugar at home at all. Drinking liquids well now, still decreased appetite. Blood pressures at home running 170s/130s. Does endorse occasional palpitations. Has never seen a heart doctor before.  ROS: See HPI.  Hemet: history of type 2 diabetes, PCOS, hypertension, depression, hyperlipidemia, OSA   PHYSICAL EXAM: BP (!) 139/98   Pulse (!) 114   Temp 98.5 F (36.9 C) (Oral)   Wt 212 lb (96.2 kg)   LMP 11/23/2018 (Exact Date)   SpO2 98%   BMI 42.82 kg/m  Gen: no acute distress, pleasant, cooperative HEENT: normocephalic, atraumatic, mmm Heart: mildly tachycardic, regular rhythm, no murmurs Lungs: clear to auscultation bilaterally, normal work of breathing  Neuro: alert, grossly nonfocal, speech normal, gait normal Abdomen: soft, nontender to palpation, no masses or organomegaly appreciated Ext: 2+ lower extremity pitting edema bilaterally, unchanged from last week  ASSESSMENT/PLAN:  HFrEF (heart failure with reduced ejection fraction) (HCC) New diagnosis of HFrEF based on today's echo with LV EF of  20-25%. Echo also noted new onset afib, which is not demonstrated on EKG today in-office. EKG without findings of acute ischemia on my read. Discussed patient with cardiologist on call, Dr. Irish Lack, by phone. Patient is not in florid CHF as she has no real respiratory symptoms, mainly just edema and hypertension.  Plan after discussion w/ Dr. Irish Lack: - add carvedilol 3.125mg  twice daily to help lower blood pressure and heart rate - start xarelto for atrial fibrillation - gave patient sample of 7 day supply and sent in new rx to her pharmacy  - increase lasix to 40mg  twice daily for 3 days then go down to 40mg  daily - appointment scheduled tomorrow AM with cardiologist Dr. Curt Bears, provided patient with appointment info  Atrial fibrillation [facility]) Noted on echo 12/31/18 though not seen on EKG today (sinus tach). Starting beta blocker for better rate control and also starting xarelto for [facility] of 4. Follow up with cardiology in AM, appointment scheduled.  Elevated LFTs Mildly elevated AST, ALT, and bilirubin. Repeat CMET today  Diabetes Patient did not want to discuss diabetes today, offered for her to follow up with me when she is ready  Refilled epipen for her, has anaphylaxis to shellfish  FOLLOW UP: Follow up tomorrow AM with cardiology for new CHF Patient to schedule appointment here for diabetes follow up when she is ready  [place] J. Ardelia Mems, [place]

## 2018-12-31 NOTE — Assessment & Plan Note (Signed)
Noted on echo 12/31/18 though not seen on EKG today (sinus tach). Starting beta blocker for better rate control and also starting xarelto for [facility] of 4. Follow up with cardiology in AM, appointment scheduled.

## 2018-12-31 NOTE — Assessment & Plan Note (Addendum)
Stopping baclofen and zofran seem to be helping. Patient agrees to continue challenging herself with liquids (water, gatorade, soups) and bland foods. She agrees to call back this afternoon before 5pm if she can no longer keep down liquids. Discussed reasons to go to the ED such as continued vomiting and unable to take medications (specifically in her case Lasix to help her HTN and LE edema)

## 2019-01-01 ENCOUNTER — Ambulatory Visit (INDEPENDENT_AMBULATORY_CARE_PROVIDER_SITE_OTHER): Payer: Self-pay | Admitting: Cardiology

## 2019-01-01 ENCOUNTER — Ambulatory Visit: Payer: Medicaid Other | Admitting: Family Medicine

## 2019-01-01 ENCOUNTER — Encounter: Payer: Self-pay | Admitting: Cardiology

## 2019-01-01 VITALS — BP 150/90 | HR 113 | Ht 59.0 in | Wt 209.0 lb

## 2019-01-01 DIAGNOSIS — I5022 Chronic systolic (congestive) heart failure: Secondary | ICD-10-CM

## 2019-01-01 DIAGNOSIS — Z79899 Other long term (current) drug therapy: Secondary | ICD-10-CM

## 2019-01-01 LAB — CMP14+EGFR
ALT: 32 IU/L (ref 0–32)
AST: 27 IU/L (ref 0–40)
Albumin/Globulin Ratio: 1 — ABNORMAL LOW (ref 1.2–2.2)
Albumin: 3.5 g/dL — ABNORMAL LOW (ref 3.8–4.8)
Alkaline Phosphatase: 136 IU/L — ABNORMAL HIGH (ref 39–117)
BUN/Creatinine Ratio: 13 (ref 9–23)
BUN: 13 mg/dL (ref 6–24)
Bilirubin Total: 1.4 mg/dL — ABNORMAL HIGH (ref 0.0–1.2)
CO2: 23 mmol/L (ref 20–29)
Calcium: 9.2 mg/dL (ref 8.7–10.2)
Chloride: 93 mmol/L — ABNORMAL LOW (ref 96–106)
Creatinine, Ser: 0.97 mg/dL (ref 0.57–1.00)
GFR calc Af Amer: 84 mL/min/{1.73_m2} (ref >59)
GFR calc non Af Amer: 73 mL/min/{1.73_m2} (ref >59)
Globulin, Total: 3.6 g/dL (ref 1.5–4.5)
Glucose: 465 mg/dL — ABNORMAL HIGH (ref 65–99)
Potassium: 4.1 mmol/L (ref 3.5–5.2)
Sodium: 133 mmol/L — ABNORMAL LOW (ref 134–144)
Total Protein: 7.1 g/dL (ref 6.0–8.5)

## 2019-01-01 MED ORDER — SACUBITRIL-VALSARTAN 49-51 MG PO TABS: 1.0000 | ORAL_TABLET | Freq: Two times a day (BID) | ORAL | 3 refills | Status: DC

## 2019-01-01 MED ORDER — CARVEDILOL 6.25 MG PO TABS: 6.2500 mg | ORAL_TABLET | Freq: Two times a day (BID) | ORAL | 1 refills | Status: DC

## 2019-01-01 NOTE — Addendum Note (Signed)
Addended by: Stanton Kidney on: 01/01/2019 11:03 AM   Modules accepted: Orders

## 2019-01-01 NOTE — Progress Notes (Signed)
PCP:  [name], DO Primary Cardiologist: No primary care provider on file. Electrophysiologist: New to Dr. Richarda [name] is a 43 y.o. female who presents today to establish with EP. Referred by Dr. Ardelia Mems for new systolic CHF thought to be Afib -> Tachy induced cardiomyopathy. Started on Xarelto and coreg yesterday by PCP.     She is feeling overwhelmed today, but symptomatically OK. She has had peripheral edema for 3-4 weeks, which is what prompted her to come in. She denies lightheadedness or dizziness. She has tachy-palpitations, occasional orthopnea, peripheral edema. She denies chest pain. She is intolerant to CPAP, and hasn't worn in years. She reports taking her medication as directed, but is limited from a financial standpoint. She is a single mom, and grandma. No known family history on her father's side, she states does not know anything about her biological mother's side.    Past Medical History:  Diagnosis Date  . Abnormal Pap smear   . Anemia   . Asthma   . Diabetes mellitus   . Hypertension   . 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    . Migraines   . Polycystic ovarian disease    Past Surgical History:  Procedure Laterality Date  . CERVIX LESION DESTRUCTION    . CESAREAN SECTION     Family History  Problem Relation Age of Onset  . Diabetes Mother   . Hypertension Mother   . Hypertension Father   . Diabetes Father     Current Outpatient Medications  Medication Sig Dispense Refill  . ACCU-CHEK SOFTCLIX LANCETS lancets Check fasting blood sugar daily in the morning 100 each 12  . albuterol (VENTOLIN HFA) 108 (90 Base) MCG/ACT inhaler Inhale 2 puffs into the lungs every 6 (six) hours as needed for wheezing or shortness of breath. 18 g 1  . carvedilol (COREG) 3.125 MG tablet Take 1 tablet (3.125 mg total) by mouth 2 (two) times daily with  a meal. 60 tablet 1  . furosemide (LASIX) 40 MG tablet Take 1 tablet PO twice daily for 3 days then go down to 1 tablet PO daily. 60 tablet 1  . glipiZIDE (GLUCOTROL XL) 5 MG 24 hr tablet Take 1 tablet (5 mg total) by mouth daily with breakfast. 30 tablet 3  . glucose blood (AGAMATRIX PRESTO TEST) test strip Use as instructed 100 each 12  . hydrochlorothiazide (HYDRODIURIL) 25 MG tablet Take 1 tablet (25 mg total) by mouth daily. 90 tablet 3  . hydrOXYzine (ATARAX/VISTARIL) 10 MG tablet Take 1 tablet (10 mg total) by mouth 3 (three) times daily as needed. 30 tablet 0  . lisinopril (PRINIVIL,ZESTRIL) 20 MG tablet Take 1 tablet (20 mg total) by mouth daily. 90 tablet 3  . rivaroxaban (XARELTO) 20 MG TABS tablet Take 1 tablet (20 mg total) by mouth daily with supper. 30 tablet 1  . rivaroxaban (XARELTO) 20 MG TABS tablet Take 1 tablet (20 mg total) by mouth daily with supper. With a meal 7 tablet 0   No current facility-administered medications for this visit.     Allergies  Allergen Reactions  . Shellfish Allergy Swelling    Airway involvement including EMS - Has Epi-Pen  . Metformin And Related Diarrhea    Social History   Socioeconomic History  . Marital status: Single    Spouse name: Not on file  .  Number of children: Not on file  . Years of education: Not on file  . Highest education level: Not on file  Occupational History  . Not on file  Social Needs  . Financial resource strain: Not on file  . Food insecurity:    Worry: Not on file    Inability: Not on file  . Transportation needs:    Medical: Not on file    Non-medical: Not on file  Tobacco Use  . Smoking status: Never Smoker  . Smokeless tobacco: Never Used  Substance and Sexual Activity  . Alcohol use: No    Alcohol/week: 0.0 standard drinks  . Drug use: No  . Sexual activity: Yes  Lifestyle  . Physical activity:    Days per week: Not on file    Minutes per session: Not on file  . Stress: Not on file   Relationships  . Social connections:    Talks on phone: Not on file    Gets together: Not on file    Attends religious service: Not on file    Active member of club or organization: Not on file    Attends meetings of clubs or organizations: Not on file    Relationship status: Not on file  . Intimate partner violence:    Fear of current or ex partner: Not on file    Emotionally abused: Not on file    Physically abused: Not on file    Forced sexual activity: Not on file  Other Topics Concern  . Not on file  Social History Narrative  . Not on file   Review of Systems: General: No chills, fever, night sweats or weight changes  Cardiovascular:  No chest pain or paroxysmal nocturnal dyspnea Dermatological: No rash, lesions or masses Respiratory: No cough, dyspnea Urologic: No hematuria, dysuria Abdominal: No nausea, vomiting, diarrhea, bright red blood per rectum, melena, or hematemesis Neurologic: No visual changes, weakness, changes in mental status All other systems reviewed and are otherwise negative except as noted above.  Physical Exam: Vitals:   01/01/19 0933  BP: (!) 150/90  Pulse: (!) 113  Weight: 209 lb (94.8 kg)  Height: 4\' 11"  (1.499 m)    GEN- The patient is fatigued appearing, alert and oriented x 3 today.  Obese. HEENT: normocephalic, atraumatic; sclera clear, conjunctiva pink; hearing intact; oropharynx clear; neck supple, no JVP Lymph- no cervical lymphadenopathy Lungs- Clear to ausculation bilaterally, normal work of breathing.  No wheezes, rales, rhonchi Heart- Regular, tachy. PMI non-palpable.  GI- Obese, soft, non-tender, non-distended, bowel sounds present, no hepatosplenomegaly Extremities- no clubbing or cyanosis; DP/PT/radial pulses 2+ bilaterally. 2-3+ peripheral edema into her thighs. MS- no significant deformity or atrophy Skin- warm and dry, no rash or lesion Psych- euthymic mood, full affect Neuro- strength and sensation are intact  EKG today  personally reviewed. Shows Sinus tach at 113 bpm  Assessment and Plan:  1. Paroxysmal Atrial Fibrillation - Noted on Echo 12/31/2018, but Sinus tach by EKG following. This patients CHA2DS2-VASc is at least 4 (Female, CHF, HTN, DM). Sinus tach today. Given Xarelto samples today  2. Chronic systolic CHF  - likely NICM/ tachy induced. Makynlee Kressin optimize her afib and repeat Echo in 3+ months.Echo 12/31/2018 LVEF 20-25%, GLS -5.7%, RV Mod reduced.  Stop HCTz and Lisinopril. Start Entresto 49/51 mg tomorrow night (for washout). Given Entresto card.   3. OSA; intolerant to CPAP - Encouraged weight loss.  4. DM2  According to PCP note, resistant to medication titration. Should consider  Jardiance.   5. HTN Alayasia Breeding treat in the setting of adjusting her HF medications. Consider Bidil once clear that she is tolerating Entresto.  Shirley Friar, PA-C  01/01/19 9:21 AM  I have seen and examined this patient with Oda Kilts.  Agree with above, note added to reflect my findings.  On exam, tachycardic, regular, 2+ edema, lungs clear.  Patient with new onset heart failure which is decompensated.  We Zadie Deemer plan to increase her carvedilol and start her on Entresto.  She is also to take Lasix to help with her fluid.  We Dermot Gremillion have her arrange to follow-up in heart failure clinic.  He was noted to be in atrial fibrillation on her echocardiogram.  We Marshal Schrecengost continue Xarelto.  Isabele Lollar M. Alayla Dethlefs MD 01/01/2019 10:42 AM

## 2019-01-01 NOTE — Patient Instructions (Addendum)
Medication Instructions:  Your physician has recommended you make the following change in your medication:  1. STOP Lisinopril (patient took last dose yesterday morning). 2. STOP Hydrochlorothiazide 3. START Entresto 49/51 mg twice a day -- (START on 6/10 p.m. dose) 4. INCREASE Carvedilol to 6.25 mg twice a day  * If you need a refill on your cardiac medications before your next appointment, please call your pharmacy.   Labwork: Your physician recommends that you return for lab work in: 7-10 days for BMET & CBC. *We will only notify you of abnormal results, otherwise continue current treatment plan.  Testing/Procedures: None ordered  Follow-Up: You have been referred to Heart Failure Clinic, to be seen in the next 1-2 weeks.  No follow up is needed at this time with Dr. Curt Bears.  He will see you on an as needed basis.  Thank you for choosing [facility]!!   Trinidad Curet, RN [phone]  Any Other Special Instructions Will Be Listed Below (If Applicable). I will forward information along to heart failure clinic social worker to address medication assistance.    Sacubitril; Valsartan oral tablet What is this medicine? SACUBITRIL; VALSARTAN (sak UE bi tril; val SAR tan) is a combination of 2 drugs used to reduce the risk of death and hospitalizations in people with long-lasting heart failure. It is usually used with other medicines to treat heart failure. This medicine may be used for other purposes; ask your health care provider or pharmacist if you have questions. COMMON BRAND NAME(S): Entresto What should I tell my health care provider before I take this medicine? They need to know if you have any of these conditions: -diabetes and take a medicine that contains aliskiren -kidney disease -liver disease -an unusual or allergic reaction to sacubitril; valsartan, drugs called angiotensin converting enzyme (ACE) inhibitors, angiotensin II receptor blockers (ARBs), other  medicines, foods, dyes, or preservatives -pregnant or trying to get pregnant -breast-feeding How should I use this medicine? Take this medicine by mouth with a glass of water. Follow the directions on the prescription label. You can take it with or without food. If it upsets your stomach, take it with food. Take your medicine at regular intervals. Do not take it more often than directed. Do not stop taking except on your doctor's advice. Do not take this medicine for at least 36 hours before or after you take an ACE inhibitor medicine. Talk to your health care provider if you are not sure if you take an ACE inhibitor. Talk to your pediatrician regarding the use of this medicine in children. Special care may be needed. Overdosage: If you think you have taken too much of this medicine contact a poison control center or emergency room at once. NOTE: This medicine is only for you. Do not share this medicine with others. What if I miss a dose? If you miss a dose, take it as soon as you can. If it is almost time for next dose, take only that dose. Do not take double or extra doses. What may interact with this medicine? Do not take this medicine with any of the following medicines: -aliskiren if you have diabetes -angiotensin-converting enzyme (ACE) inhibitors, like benazepril, captopril, enalapril, fosinopril, lisinopril, or ramipril This medicine may also interact with the following medicines: -angiotensin II receptor blockers (ARBs) like azilsartan, candesartan, eprosartan, irbesartan, losartan, olmesartan, telmisartan, or valsartan -lithium -NSAIDS, medicines for pain and inflammation, like ibuprofen or naproxen -potassium-sparing diuretics like amiloride, spironolactone, and triamterene -potassium supplements This list  may not describe all possible interactions. Give your health care provider a list of all the medicines, herbs, non-prescription drugs, or dietary supplements you use. Also tell them  if you smoke, drink alcohol, or use illegal drugs. Some items may interact with your medicine. What should I watch for while using this medicine? Tell your doctor or healthcare professional if your symptoms do not start to get better or if they get worse. Do not become pregnant while taking this medicine. Women should inform their doctor if they wish to become pregnant or think they might be pregnant. There is a potential for serious side effects to an unborn child. Talk to your health care professional or pharmacist for more information. You may get dizzy. Do not drive, use machinery, or do anything that needs mental alertness until you know how this medicine affects you. Do not stand or sit up quickly, especially if you are an older patient. This reduces the risk of dizzy or fainting spells. Avoid alcoholic drinks; they can make you more dizzy. What side effects may I notice from receiving this medicine? Side effects that you should report to your doctor or health care professional as soon as possible: -allergic reactions like skin rash, itching or hives, swelling of the face, lips, or tongue -signs and symptoms of increased potassium like muscle weakness; chest pain; or fast, irregular heartbeat -signs and symptoms of kidney injury like trouble passing urine or change in the amount of urine -signs and symptoms of low blood pressure like feeling dizzy or lightheaded, or if you develop extreme fatigue Side effects that usually do not require medical attention (report to your doctor or health care professional if they continue or are bothersome): -cough This list may not describe all possible side effects. Call your doctor for medical advice about side effects. You may report side effects to FDA at 1-800-FDA-1088. Where should I keep my medicine? Keep out of the reach of children. Store at room temperature between 15 and 30 degrees C (59 and 86 degrees F). Throw away any unused medicine after the  expiration date. NOTE: This sheet is a summary. It may not cover all possible information. If you have questions about this medicine, talk to your doctor, pharmacist, or health care provider.  2019 Elsevier/Gold Standard (2015-08-26 13:54:19)

## 2019-01-04 ENCOUNTER — Telehealth: Payer: Self-pay | Admitting: Family Medicine

## 2019-01-04 NOTE — Telephone Encounter (Signed)
Called patient to discuss results and check on her.  Advised LFTs trending in right direction. Glucose very elevated. Patient feeling ok overall, not acidotic on labs so doubt DKA. Recommend checking blood sugar at least daily. At recent visits patient has been resistant to discussing diabetes control.  Patient tearful, understandably so. Has had lots to adjust to this week with new dx of systolic CHF. She is worried about leaving her kids behind. Admits feeling depressed. Denies SI.  Follow up visit scheduled with me on 6/23. Advised bringing her sugar log to that visit.  Leeanne Rio, MD

## 2019-01-09 ENCOUNTER — Other Ambulatory Visit: Payer: Medicaid Other

## 2019-01-15 ENCOUNTER — Ambulatory Visit: Payer: Medicaid Other | Admitting: Family Medicine

## 2019-01-18 ENCOUNTER — Telehealth (INDEPENDENT_AMBULATORY_CARE_PROVIDER_SITE_OTHER): Payer: Self-pay | Admitting: Family Medicine

## 2019-01-18 ENCOUNTER — Other Ambulatory Visit: Payer: Self-pay

## 2019-01-18 DIAGNOSIS — B379 Candidiasis, unspecified: Secondary | ICD-10-CM

## 2019-01-18 MED ORDER — FLUCONAZOLE 150 MG PO TABS: 150.0000 mg | ORAL_TABLET | Freq: Once | ORAL | 0 refills | Status: AC

## 2019-01-18 MED ORDER — ONDANSETRON HCL 4 MG PO TABS: 4.0000 mg | ORAL_TABLET | Freq: Three times a day (TID) | ORAL | 0 refills | Status: DC | PRN

## 2019-01-18 NOTE — Progress Notes (Addendum)
[place] Telemedicine Visit  Patient consented to have virtual visit. Method of visit: Telephone  Encounter participants: Patient: [name] - located at home Provider: Lovenia Kim - located at office Others (if applicable): N/A  Chief Complaint: yeast infection  HPI:  Reports vaginal discharge that began 2-3 days ago.  Discharge is white and thick, no associated odor.  She believes she has a yeast infection as she has had similar in the past.  No recent use of antibiotics.  Vaginal area is itchy and "raw". No new sexual partners.   No fever, chills, nausea, vomiting.  No urinary symptoms.   ROS: per HPI  Pertinent PMHx: HFrEF, atrial fibrillation, OSA, T2DM, PCOS, MDD  Exam:  Respiratory: comfortable, speaking in complete sentences   Assessment/Plan:  Vaginal discharge Similar to prior yeast infections.  No new sexual partners.  No urinary or systemic symptoms to suspect UTI.   Rx: diflucan, 2 tabs Return precautions discussed, recommend she come in for wet prep if symptoms persist as could also be related to BV or other infection.   Time spent during visit with patient: 15 minutes

## 2019-02-03 ENCOUNTER — Telehealth: Payer: Self-pay | Admitting: Student in an Organized Health Care Education/Training Program

## 2019-02-03 NOTE — Telephone Encounter (Signed)
Patient states she is having increased difficulty with breathing from her baseline. She is able to walk around house without difficulty but becomes short of breath when walking up the stairs. She feels that she is not struggling to get breath at rest. She endorses an increased LE edema. She has not taken her lasix today.  She denies chest pain, palpitations, syncope.  Given her recent dx of HFrEF 20-25% EF in 12/2018, I recommended she goes to ED at this time. She apprehensively agreed but sounded hesitant.

## 2019-02-05 ENCOUNTER — Other Ambulatory Visit: Payer: Self-pay

## 2019-02-05 ENCOUNTER — Ambulatory Visit (INDEPENDENT_AMBULATORY_CARE_PROVIDER_SITE_OTHER): Payer: Self-pay | Admitting: Family Medicine

## 2019-02-05 VITALS — BP 148/120 | HR 123 | Wt 211.6 lb

## 2019-02-05 DIAGNOSIS — R0602 Shortness of breath: Secondary | ICD-10-CM

## 2019-02-05 DIAGNOSIS — I480 Paroxysmal atrial fibrillation: Secondary | ICD-10-CM

## 2019-02-05 DIAGNOSIS — E119 Type 2 diabetes mellitus without complications: Secondary | ICD-10-CM

## 2019-02-05 DIAGNOSIS — I5020 Unspecified systolic (congestive) heart failure: Secondary | ICD-10-CM

## 2019-02-05 DIAGNOSIS — I5041 Acute combined systolic (congestive) and diastolic (congestive) heart failure: Secondary | ICD-10-CM

## 2019-02-05 DIAGNOSIS — B379 Candidiasis, unspecified: Secondary | ICD-10-CM

## 2019-02-05 MED ORDER — SACUBITRIL-VALSARTAN 49-51 MG PO TABS: 1.0000 | ORAL_TABLET | Freq: Two times a day (BID) | ORAL | 0 refills | Status: DC

## 2019-02-05 MED ORDER — FLUCONAZOLE 150 MG PO TABS: 150.0000 mg | ORAL_TABLET | Freq: Once | ORAL | 0 refills | Status: AC

## 2019-02-05 MED ORDER — FUROSEMIDE 40 MG PO TABS: ORAL_TABLET | ORAL | 0 refills | Status: DC

## 2019-02-05 MED ORDER — RIVAROXABAN 20 MG PO TABS: 20.0000 mg | ORAL_TABLET | Freq: Every day | ORAL | 0 refills | Status: DC

## 2019-02-05 NOTE — Patient Instructions (Signed)
Great meeting you today!  I am sorry you are feeling so rundown.  I think this is likely due to increased heart failure.  I cannot stress enough how important it is for you take your CPAP.  This is sort of the root of all of your issues.  For your increased swelling I will increase your Lasix to 3 times a day.  You need to call your cardiologist as soon as possible to see them.  I will send you in a Diflucan for your yeast infection.  We are getting some blood work to check your kidney function.

## 2019-02-06 ENCOUNTER — Telehealth: Payer: Self-pay | Admitting: Family Medicine

## 2019-02-06 ENCOUNTER — Encounter: Payer: Self-pay | Admitting: Family Medicine

## 2019-02-06 ENCOUNTER — Emergency Department (HOSPITAL_COMMUNITY): Payer: Self-pay

## 2019-02-06 ENCOUNTER — Inpatient Hospital Stay (HOSPITAL_COMMUNITY)
Admission: EM | Admit: 2019-02-06 | Discharge: 2019-02-09 | DRG: 638 | Disposition: A | Discharge status: Home / Self Care | Payer: Self-pay | Attending: Family Medicine | Admitting: Family Medicine

## 2019-02-06 ENCOUNTER — Other Ambulatory Visit: Payer: Self-pay

## 2019-02-06 DIAGNOSIS — I5082 Biventricular heart failure: Secondary | ICD-10-CM | POA: Insufficient documentation

## 2019-02-06 DIAGNOSIS — E1110 Type 2 diabetes mellitus with ketoacidosis without coma: Secondary | ICD-10-CM

## 2019-02-06 DIAGNOSIS — E785 Hyperlipidemia, unspecified: Secondary | ICD-10-CM | POA: Diagnosis present

## 2019-02-06 DIAGNOSIS — F419 Anxiety disorder, unspecified: Secondary | ICD-10-CM | POA: Diagnosis present

## 2019-02-06 DIAGNOSIS — Z91013 Allergy to seafood: Secondary | ICD-10-CM

## 2019-02-06 DIAGNOSIS — G4733 Obstructive sleep apnea (adult) (pediatric): Secondary | ICD-10-CM | POA: Diagnosis present

## 2019-02-06 DIAGNOSIS — Z888 Allergy status to other drugs, medicaments and biological substances status: Secondary | ICD-10-CM

## 2019-02-06 DIAGNOSIS — J45909 Unspecified asthma, uncomplicated: Secondary | ICD-10-CM | POA: Diagnosis present

## 2019-02-06 DIAGNOSIS — R739 Hyperglycemia, unspecified: Secondary | ICD-10-CM

## 2019-02-06 DIAGNOSIS — E282 Polycystic ovarian syndrome: Secondary | ICD-10-CM | POA: Diagnosis present

## 2019-02-06 DIAGNOSIS — Z7901 Long term (current) use of anticoagulants: Secondary | ICD-10-CM

## 2019-02-06 DIAGNOSIS — F329 Major depressive disorder, single episode, unspecified: Secondary | ICD-10-CM | POA: Diagnosis present

## 2019-02-06 DIAGNOSIS — D500 Iron deficiency anemia secondary to blood loss (chronic): Secondary | ICD-10-CM | POA: Diagnosis present

## 2019-02-06 DIAGNOSIS — B379 Candidiasis, unspecified: Secondary | ICD-10-CM | POA: Diagnosis present

## 2019-02-06 DIAGNOSIS — E1165 Type 2 diabetes mellitus with hyperglycemia: Principal | ICD-10-CM | POA: Diagnosis present

## 2019-02-06 DIAGNOSIS — I4891 Unspecified atrial fibrillation: Secondary | ICD-10-CM | POA: Diagnosis present

## 2019-02-06 DIAGNOSIS — N179 Acute kidney failure, unspecified: Secondary | ICD-10-CM | POA: Diagnosis present

## 2019-02-06 DIAGNOSIS — Z8249 Family history of ischemic heart disease and other diseases of the circulatory system: Secondary | ICD-10-CM

## 2019-02-06 DIAGNOSIS — I50810 Right heart failure, unspecified: Secondary | ICD-10-CM | POA: Insufficient documentation

## 2019-02-06 DIAGNOSIS — I110 Hypertensive heart disease with heart failure: Secondary | ICD-10-CM | POA: Diagnosis present

## 2019-02-06 DIAGNOSIS — Z20828 Contact with and (suspected) exposure to other viral communicable diseases: Secondary | ICD-10-CM

## 2019-02-06 DIAGNOSIS — Z7984 Long term (current) use of oral hypoglycemic drugs: Secondary | ICD-10-CM

## 2019-02-06 DIAGNOSIS — I5022 Chronic systolic (congestive) heart failure: Secondary | ICD-10-CM | POA: Diagnosis present

## 2019-02-06 DIAGNOSIS — Z833 Family history of diabetes mellitus: Secondary | ICD-10-CM

## 2019-02-06 DIAGNOSIS — G43909 Migraine, unspecified, not intractable, without status migrainosus: Secondary | ICD-10-CM | POA: Diagnosis present

## 2019-02-06 HISTORY — DX: Type 2 diabetes mellitus with ketoacidosis without coma: E11.10

## 2019-02-06 HISTORY — DX: Morbid (severe) obesity due to excess calories: E66.01

## 2019-02-06 LAB — BASIC METABOLIC PANEL
Anion gap: 11 (ref 5–15)
Anion gap: 11 (ref 5–15)
Anion gap: 14 (ref 5–15)
BUN: 11 mg/dL (ref 6–20)
BUN: 11 mg/dL (ref 6–20)
BUN: 12 mg/dL (ref 6–20)
CO2: 24 mmol/L (ref 22–32)
CO2: 20 mmol/L — ABNORMAL LOW (ref 22–32)
CO2: 20 mmol/L — ABNORMAL LOW (ref 22–32)
Calcium: 8.6 mg/dL — ABNORMAL LOW (ref 8.9–10.3)
Calcium: 8.6 mg/dL — ABNORMAL LOW (ref 8.9–10.3)
Calcium: 8.7 mg/dL — ABNORMAL LOW (ref 8.9–10.3)
Chloride: 91 mmol/L — ABNORMAL LOW (ref 98–111)
Chloride: 95 mmol/L — ABNORMAL LOW (ref 98–111)
Chloride: 96 mmol/L — ABNORMAL LOW (ref 98–111)
Creatinine, Ser: 1.12 mg/dL — ABNORMAL HIGH (ref 0.44–1.00)
Creatinine, Ser: 1 mg/dL (ref 0.44–1.00)
Creatinine, Ser: 0.87 mg/dL (ref 0.44–1.00)
GFR calc Af Amer: >60 mL/min (ref >60)
GFR calc Af Amer: >60 mL/min (ref >60)
GFR calc Af Amer: >60 mL/min (ref >60)
GFR calc non Af Amer: >60 mL/min (ref >60)
GFR calc non Af Amer: >60 mL/min (ref >60)
GFR calc non Af Amer: >60 mL/min (ref >60)
Glucose, Bld: 569 mg/dL (ref 70–99)
Glucose, Bld: 335 mg/dL — ABNORMAL HIGH (ref 70–99)
Glucose, Bld: 183 mg/dL — ABNORMAL HIGH (ref 70–99)
Potassium: 4.7 mmol/L (ref 3.5–5.1)
Potassium: 3.8 mmol/L (ref 3.5–5.1)
Potassium: 4 mmol/L (ref 3.5–5.1)
Sodium: 126 mmol/L — ABNORMAL LOW (ref 135–145)
Sodium: 126 mmol/L — ABNORMAL LOW (ref 135–145)
Sodium: 130 mmol/L — ABNORMAL LOW (ref 135–145)

## 2019-02-06 LAB — CBC WITH DIFFERENTIAL/PLATELET
Basophils Absolute: 0.1 10*3/uL (ref 0.0–0.2)
Basos: 1 %
EOS (ABSOLUTE): 0 10*3/uL (ref 0.0–0.4)
Eos: 0 %
Hematocrit: 42.6 % (ref 34.0–46.6)
Hemoglobin: 12.1 g/dL (ref 11.1–15.9)
Immature Grans (Abs): 0 10*3/uL (ref 0.0–0.1)
Immature Granulocytes: 0 %
Lymphocytes Absolute: 2.4 10*3/uL (ref 0.7–3.1)
Lymphs: 24 %
MCH: 21 pg — ABNORMAL LOW (ref 26.6–33.0)
MCHC: 28.4 g/dL — ABNORMAL LOW (ref 31.5–35.7)
MCV: 74 fL — ABNORMAL LOW (ref 79–97)
Monocytes Absolute: 0.6 10*3/uL (ref 0.1–0.9)
Monocytes: 6 %
Neutrophils Absolute: 6.8 10*3/uL (ref 1.4–7.0)
Neutrophils: 69 %
Platelets: 202 10*3/uL (ref 150–450)
RBC: 5.76 x10E6/uL — ABNORMAL HIGH (ref 3.77–5.28)
RDW: 16.8 % — ABNORMAL HIGH (ref 11.7–15.4)
WBC: 9.9 10*3/uL (ref 3.4–10.8)

## 2019-02-06 LAB — CBG MONITORING, ED
Glucose-Capillary: 562 mg/dL (ref 70–99)
Glucose-Capillary: 544 mg/dL (ref 70–99)
Glucose-Capillary: 489 mg/dL — ABNORMAL HIGH (ref 70–99)
Glucose-Capillary: 364 mg/dL — ABNORMAL HIGH (ref 70–99)

## 2019-02-06 LAB — URINALYSIS, ROUTINE W REFLEX MICROSCOPIC
Bacteria, UA: NONE SEEN
Bilirubin Urine: NEGATIVE
Glucose, UA: >=500 mg/dL — AB
Ketones, ur: NEGATIVE mg/dL
Leukocytes,Ua: NEGATIVE
Nitrite: NEGATIVE
Protein, ur: 100 mg/dL — AB
Specific Gravity, Urine: 1.035 — ABNORMAL HIGH (ref 1.005–1.030)
pH: 6 (ref 5.0–8.0)

## 2019-02-06 LAB — COMPREHENSIVE METABOLIC PANEL
ALT: 25 IU/L (ref 0–32)
AST: 38 IU/L (ref 0–40)
Albumin/Globulin Ratio: 0.7 — ABNORMAL LOW (ref 1.2–2.2)
Albumin: 3.1 g/dL — ABNORMAL LOW (ref 3.8–4.8)
Alkaline Phosphatase: 201 IU/L — ABNORMAL HIGH (ref 39–117)
BUN/Creatinine Ratio: 11 (ref 9–23)
BUN: 12 mg/dL (ref 6–24)
Bilirubin Total: 1.6 mg/dL — ABNORMAL HIGH (ref 0.0–1.2)
CO2: 21 mmol/L (ref 20–29)
Calcium: 9 mg/dL (ref 8.7–10.2)
Chloride: 91 mmol/L — ABNORMAL LOW (ref 96–106)
Creatinine, Ser: 1.1 mg/dL — ABNORMAL HIGH (ref 0.57–1.00)
GFR calc Af Amer: 72 mL/min/{1.73_m2} (ref >59)
GFR calc non Af Amer: 63 mL/min/{1.73_m2} (ref >59)
Globulin, Total: 4.2 g/dL (ref 1.5–4.5)
Glucose: 603 mg/dL (ref 65–99)
Potassium: 5.3 mmol/L — ABNORMAL HIGH (ref 3.5–5.2)
Sodium: 128 mmol/L — ABNORMAL LOW (ref 134–144)
Total Protein: 7.3 g/dL (ref 6.0–8.5)

## 2019-02-06 LAB — CBC
HCT: 40.4 % (ref 36.0–46.0)
Hemoglobin: 12.4 g/dL (ref 12.0–15.0)
MCH: 21.1 pg — ABNORMAL LOW (ref 26.0–34.0)
MCHC: 30.7 g/dL (ref 30.0–36.0)
MCV: 68.8 fL — ABNORMAL LOW (ref 80.0–100.0)
Platelets: 181 10*3/uL (ref 150–400)
RBC: 5.87 MIL/uL — ABNORMAL HIGH (ref 3.87–5.11)
RDW: 18.1 % — ABNORMAL HIGH (ref 11.5–15.5)
WBC: 10.8 10*3/uL — ABNORMAL HIGH (ref 4.0–10.5)
nRBC: 0 % (ref 0.0–0.2)

## 2019-02-06 LAB — I-STAT BETA HCG BLOOD, ED (MC, WL, AP ONLY): I-stat hCG, quantitative: <5 m[IU]/mL (ref <5)

## 2019-02-06 LAB — SARS CORONAVIRUS 2 BY RT PCR (HOSPITAL ORDER, PERFORMED IN ~~LOC~~ HOSPITAL LAB): SARS Coronavirus 2: NEGATIVE

## 2019-02-06 MED ORDER — ALBUTEROL SULFATE (2.5 MG/3ML) 0.083% IN NEBU: 3.0000 mL | INHALATION_SOLUTION | Freq: Four times a day (QID) | RESPIRATORY_TRACT | Status: DC | PRN

## 2019-02-06 MED ORDER — INSULIN ASPART 100 UNIT/ML ~~LOC~~ SOLN
0.0000 [IU] | Freq: Three times a day (TID) | SUBCUTANEOUS | Status: DC
  Administered 2019-02-07: 9 [IU] via SUBCUTANEOUS
  Administered 2019-02-07 – 2019-02-08 (×2): 7 [IU] via SUBCUTANEOUS
  Administered 2019-02-08: 2 [IU] via SUBCUTANEOUS
  Administered 2019-02-08: 7 [IU] via SUBCUTANEOUS
  Administered 2019-02-09: 5 [IU] via SUBCUTANEOUS

## 2019-02-06 MED ORDER — SODIUM CHLORIDE 0.9 % IV SOLN: INTRAVENOUS | Status: DC

## 2019-02-06 MED ORDER — ONDANSETRON HCL 4 MG PO TABS
4.0000 mg | ORAL_TABLET | Freq: Three times a day (TID) | ORAL | Status: DC | PRN
  Administered 2019-02-08: 4 mg via ORAL
  Filled 2019-02-06: qty 1

## 2019-02-06 MED ORDER — RIVAROXABAN 20 MG PO TABS
20.0000 mg | ORAL_TABLET | Freq: Every day | ORAL | Status: DC
  Administered 2019-02-07 – 2019-02-08 (×2): 20 mg via ORAL
  Filled 2019-02-06 (×2): qty 1

## 2019-02-06 MED ORDER — SODIUM CHLORIDE 0.9 % IV BOLUS: 1000.0000 mL | Freq: Once | INTRAVENOUS | Status: DC

## 2019-02-06 MED ORDER — HYDROXYZINE HCL 10 MG PO TABS
10.0000 mg | ORAL_TABLET | Freq: Three times a day (TID) | ORAL | Status: DC | PRN
  Administered 2019-02-07 – 2019-02-09 (×2): 10 mg via ORAL
  Filled 2019-02-06 (×3): qty 1

## 2019-02-06 MED ORDER — DEXTROSE-NACL 5-0.45 % IV SOLN: INTRAVENOUS | Status: DC

## 2019-02-06 MED ORDER — SODIUM CHLORIDE 0.9 % IV SOLN
INTRAVENOUS | Status: DC
  Administered 2019-02-06: 23:00:00 via INTRAVENOUS

## 2019-02-06 MED ORDER — POTASSIUM CHLORIDE 10 MEQ/100ML IV SOLN: 10.0000 meq | INTRAVENOUS | Status: DC

## 2019-02-06 MED ORDER — CARVEDILOL 12.5 MG PO TABS
6.2500 mg | ORAL_TABLET | Freq: Two times a day (BID) | ORAL | Status: DC
  Administered 2019-02-06 – 2019-02-09 (×6): 6.25 mg via ORAL
  Filled 2019-02-06 (×6): qty 1

## 2019-02-06 MED ORDER — INSULIN GLARGINE 100 UNIT/ML ~~LOC~~ SOLN
10.0000 [IU] | Freq: Every day | SUBCUTANEOUS | Status: DC
  Administered 2019-02-06 – 2019-02-07 (×2): 10 [IU] via SUBCUTANEOUS
  Filled 2019-02-06 (×3): qty 0.1

## 2019-02-06 MED ORDER — HYDROCHLOROTHIAZIDE 25 MG PO TABS: 25.0000 mg | ORAL_TABLET | Freq: Every day | ORAL | Status: DC

## 2019-02-06 MED ORDER — INSULIN REGULAR(HUMAN) IN NACL 100-0.9 UT/100ML-% IV SOLN
INTRAVENOUS | Status: DC
  Administered 2019-02-06: 4.8 [IU]/h via INTRAVENOUS
  Filled 2019-02-06: qty 100

## 2019-02-06 NOTE — Telephone Encounter (Signed)
Received critical lab result on my primary care patient from a visit they had yesterday.  From the findings with CBG over 600 and albumin corrected anion gap of 18, it appears there pushing towards DKA.  Attempted to call the patient on her phone, she did not pick up so left a voicemail message saying that she needed to call the clinic as soon as she got this.  Instructed her to ask them to read the message to her.  "[name], Dr. Criss Rosales saw the lab results from your clinic visit yesterday.  Your blood sugars are extremely high and some of your other numbers indicate that you could be getting very sick and approaching a condition we called DKA.  It is my suggestion that you go to the emergency department so they can double check on you and make sure you are doing well.  This could be a condition that gets out of hand very quickly and can be fatal so please go to the emergency department to be checked out."  -Dr. Criss Rosales

## 2019-02-06 NOTE — Telephone Encounter (Signed)
Have called patient additional 3 times this am. All have gone straight to voicemail. No other phone number listed. No relatives or associates listed in chart. Not on my chart. Will contiue to try and reach via phone.  Guadalupe Dawn MD PGY-2 Family Medicine Resident

## 2019-02-06 NOTE — Assessment & Plan Note (Signed)
Patient with vaginal itching, stating that she knows that she has a yeast infection. Laboratory closed by the time other problems has been addressed. Will go ahead and treat with diflucan despite lack of wet prep evidence

## 2019-02-06 NOTE — Assessment & Plan Note (Signed)
Patient with heart rate in 120s, regular rhythm on exam. Likely need increase of coreg dose, but will defer this to cardiology. Patient is to follow up with that service asap.

## 2019-02-06 NOTE — ED Notes (Signed)
ED TO INPATIENT HANDOFF REPORT  ED Nurse Name and Phone #: [phone]  S Name/Age/Gender [name] 43 y.o. female Room/Bed: 020C/020C  Code Status   Code Status: Prior  Home/SNF/Other Home Patient oriented to: self, place, time and situation Is this baseline? Yes   Triage Complete: Triage complete  Chief Complaint Abnormal Labs  Triage Note Pt was called today after having blood drawn yesterday and told to come to ED due to multiple abnormal labs. Pt had a NA level of 128 elevated blood sugar above 500.pt states she has just been feeling fatigued for over 1 week.pt denies pain.    Allergies Allergies  Allergen Reactions  . Shellfish Allergy Swelling    Airway involvement including EMS - Has Epi-Pen  . Metformin And Related Diarrhea    Level of Care/Admitting Diagnosis ED Disposition    ED Disposition Condition [facility] Area: [place] [[id number]]  Level of Care: Progressive [102]  Covid Evaluation: Asymptomatic Screening Protocol (No Symptoms)  Diagnosis: DKA, type 2 [facility]) [025427]  Admitting Physician: Richarda Osmond [[id number]]  Attending Physician: Erin Hearing, MARSHALL L [[id number]]  PT Class (Do Not Modify): Observation [104]  PT Acc Code (Do Not Modify): Observation [10022]       B Medical/Surgery History Past Medical History:  Diagnosis Date  . Abnormal Pap smear   . Anemia   . Asthma   . Diabetes mellitus   . Hypertension   . 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    . Migraines   . Morbid obesity ([place]) 02/06/2019  . Obstructive sleep apnea 06/30/2015  . Polycystic ovarian disease    Past Surgical History:  Procedure Laterality Date  . CERVIX LESION DESTRUCTION    . CESAREAN SECTION       A IV Location/Drains/Wounds Patient Lines/Drains/Airways Status   Active Line/Drains/Airways    Name:   Placement  date:   Placement time:   Site:   Days:   Peripheral IV 02/06/19 Left Antecubital   02/06/19    1636    Antecubital   less than 1          Intake/Output Last 24 hours No intake or output data in the 24 hours ending 02/06/19 1900  Labs/Imaging Results for orders placed or performed during the hospital encounter of 02/06/19 (from the past 48 hour(s))  CBG monitoring, ED     Status: Abnormal   Collection Time: 02/06/19  2:37 PM  Result Value Ref Range   Glucose-Capillary 562 (HH) 70 - 99 mg/dL   Comment 1 Notify RN   Basic metabolic panel     Status: Abnormal   Collection Time: 02/06/19  2:39 PM  Result Value Ref Range   Sodium 126 (L) 135 - 145 mmol/L   Potassium 4.7 3.5 - 5.1 mmol/L   Chloride 91 (L) 98 - 111 mmol/L   CO2 24 22 - 32 mmol/L   Glucose, Bld 569 (HH) 70 - 99 mg/dL    Comment: CRITICAL RESULT CALLED TO, READ BACK BY AND VERIFIED WITH: A Lorelie Biermann,RN  1528 02/06/2019 WBOND    BUN 11 6 - 20 mg/dL   Creatinine, Ser 1.12 (H) 0.44 - 1.00 mg/dL   Calcium 8.6 (L) 8.9 - 10.3 mg/dL   GFR calc non Af Amer >60 >60 mL/min   GFR calc Af Amer >60 >60 mL/min  Anion gap 11 5 - 15    Comment: Performed at [facility] [address] [place], [place] [address]  CBC     Status: Abnormal   Collection Time: 02/06/19  2:39 PM  Result Value Ref Range   WBC 10.8 (H) 4.0 - 10.5 K/uL   RBC 5.87 (H) 3.87 - 5.11 MIL/uL   Hemoglobin 12.4 12.0 - 15.0 g/dL   HCT 40.4 36.0 - 46.0 %   MCV 68.8 (L) 80.0 - 100.0 fL   MCH 21.1 (L) 26.0 - 34.0 pg   MCHC 30.7 30.0 - 36.0 g/dL   RDW 18.1 (H) 11.5 - 15.5 %   Platelets 181 150 - 400 K/uL    Comment: REPEATED TO VERIFY   nRBC 0.0 0.0 - 0.2 %    Comment: Performed at [facility], [place] [address], [place], [place] [address]  I-Stat beta hCG blood, ED     Status: None   Collection Time: 02/06/19  2:48 PM  Result Value Ref Range   I-stat hCG, quantitative <5.0 <5 mIU/mL   Comment 3            Comment:   GEST. AGE      CONC.   (mIU/mL)   <=1 WEEK        5 - 50     2 WEEKS       50 - 500     3 WEEKS       100 - 10,000     4 WEEKS     1,000 - 30,000        FEMALE AND NON-PREGNANT FEMALE:     LESS THAN 5 mIU/mL   CBG monitoring, ED     Status: Abnormal   Collection Time: 02/06/19  4:34 PM  Result Value Ref Range   Glucose-Capillary 544 (HH) 70 - 99 mg/dL   Comment 1 Notify RN    Comment 2 Document in Chart   Urinalysis, Routine w reflex microscopic     Status: Abnormal   Collection Time: 02/06/19  4:37 PM  Result Value Ref Range   Color, Urine YELLOW YELLOW   APPearance CLEAR CLEAR   Specific Gravity, Urine 1.035 (H) 1.005 - 1.030   pH 6.0 5.0 - 8.0   Glucose, UA >=500 (A) NEGATIVE mg/dL   Hgb urine dipstick SMALL (A) NEGATIVE   Bilirubin Urine NEGATIVE NEGATIVE   Ketones, ur NEGATIVE NEGATIVE mg/dL   Protein, ur 100 (A) NEGATIVE mg/dL   Nitrite NEGATIVE NEGATIVE   Leukocytes,Ua NEGATIVE NEGATIVE   RBC / HPF 0-5 0 - 5 RBC/hpf   WBC, UA 0-5 0 - 5 WBC/hpf   Bacteria, UA NONE SEEN NONE SEEN   Squamous Epithelial / LPF 0-5 0 - 5    Comment: Performed at [facility], [address], [place], [place] [address]  SARS Coronavirus 2 (CEPHEID - Performed in [place] hospital lab), Hosp Order     Status: None   Collection Time: 02/06/19  5:32 PM   Specimen: Nasopharyngeal Swab  Result Value Ref Range   SARS Coronavirus 2 NEGATIVE NEGATIVE    Comment: (NOTE) If result is NEGATIVE SARS-CoV-2 target nucleic acids are NOT DETECTED. The SARS-CoV-2 RNA is generally detectable in upper and lower  respiratory specimens during the acute phase of infection. The lowest  concentration of SARS-CoV-2 viral copies this assay can detect is 250  copies / mL. A negative result does not preclude SARS-CoV-2 infection  and should not be used as the sole basis for treatment or other  patient management decisions.  A negative result may occur with  improper specimen collection / handling, submission of specimen other   than nasopharyngeal swab, presence of viral mutation(s) within the  areas targeted by this assay, and inadequate number of viral copies  (<250 copies / mL). A negative result must be combined with clinical  observations, patient history, and epidemiological information. If result is POSITIVE SARS-CoV-2 target nucleic acids are DETECTED. The SARS-CoV-2 RNA is generally detectable in upper and lower  respiratory specimens dur ing the acute phase of infection.  Positive  results are indicative of active infection with SARS-CoV-2.  Clinical  correlation with patient history and other diagnostic information is  necessary to determine patient infection status.  Positive results do  not rule out bacterial infection or co-infection with other viruses. If result is PRESUMPTIVE POSTIVE SARS-CoV-2 nucleic acids MAY BE PRESENT.   A presumptive positive result was obtained on the submitted specimen  and confirmed on repeat testing.  While 2019 novel coronavirus  (SARS-CoV-2) nucleic acids may be present in the submitted sample  additional confirmatory testing may be necessary for epidemiological  and / or clinical management purposes  to differentiate between  SARS-CoV-2 and other Sarbecovirus currently known to infect humans.  If clinically indicated additional testing with an alternate test  methodology [id number]) is advised. The SARS-CoV-2 RNA is generally  detectable in upper and lower respiratory sp ecimens during the acute  phase of infection. The expected result is Negative. Fact Sheet for Patients:  StrictlyIdeas.no Fact Sheet for Healthcare Providers: BankingDealers.co.za This test is not yet approved or cleared by the [place] FDA and has been authorized for detection and/or diagnosis of SARS-CoV-2 by FDA under an Emergency Use Authorization (EUA).  This EUA will remain in effect (meaning this test can be used) for the duration of  the COVID-19 declaration under Section 564(b)(1) of the Act, 21 U.S.C. section 360bbb-3(b)(1), unless the authorization is terminated or revoked sooner. Performed at [facility], [place] [address] [place], [place] [address]   CBG monitoring, ED     Status: Abnormal   Collection Time: 02/06/19  6:04 PM  Result Value Ref Range   Glucose-Capillary 489 (H) 70 - 99 mg/dL   Dg Chest Port 1 View  Result Date: 02/06/2019 CLINICAL DATA:  Diabetic ketoacidosis EXAM: PORTABLE CHEST 1 VIEW COMPARISON:  February 04, 2016 FINDINGS: There is no edema or consolidation. There is mild atelectatic change in the left mid lung. There is cardiomegaly with pulmonary vascularity within normal limits. No adenopathy. No bone lesions. IMPRESSION: Atelectatic change left mid lung. No edema or consolidation. Stable cardiac enlargement. No adenopathy. Electronically Signed   By: Lowella Grip III M.D.   On: 02/06/2019 16:02    Pending Labs Unresulted Labs (From admission, onward)    Start     Ordered   02/06/19 0093  Basic metabolic panel  Now then every 4 hours,   R (with STAT occurrences)     02/06/19 1848   Signed and Held  HIV antibody (Routine Testing)  Once,   R     Signed and Held          Vitals/Pain Today's Vitals   02/06/19 1615 02/06/19 1733 02/06/19 1800 02/06/19 1812  BP: (!) 134/101  (!) 160/126 (!) 152/122  Pulse: (!) 114  (!) 117 (!) 121  Resp: 20  (!) 29 (!) 25  Temp:  TempSrc:      SpO2: 98%  100% 100%  PainSc:  0-No pain      Isolation Precautions No active isolations  Medications Medications  dextrose 5 %-0.45 % sodium chloride infusion (has no administration in time range)  insulin regular, human (MYXREDLIN) 100 units/ 100 mL infusion (8.6 Units/hr Intravenous Rate/Dose Change 02/06/19 1808)  carvedilol (COREG) tablet 6.25 mg (has no administration in time range)  hydrochlorothiazide (HYDRODIURIL) tablet 25 mg (has no administration in time range)  ondansetron  (ZOFRAN) tablet 4 mg (has no administration in time range)  potassium chloride 10 mEq in 100 mL IVPB (has no administration in time range)  insulin glargine (LANTUS) injection 10 Units (has no administration in time range)    Mobility walks     Focused Assessments Cardiac Assessment Handoff:  Cardiac Rhythm: Sinus tachycardia Lab Results  Component Value Date   TROPONINI <0.03 02/04/2016   Lab Results  Component Value Date   DDIMER 0.30 02/04/2016   Does the Patient currently have chest pain? No     R Recommendations: See Admitting Provider Note  Report given to:   Additional Notes:

## 2019-02-06 NOTE — ED Notes (Signed)
Pt CBG was 544, notified Alexa(RN)

## 2019-02-06 NOTE — Assessment & Plan Note (Signed)
Patient did not want to discuss how this was going at this visit. She states that she has not been checking her sugars. Will get cmp to see what her current glucose is, too early for a1c check. Encouraged follow up with pcp for management of this. She was ~10 a1c 1 month ago, will likely need insulin soon unless dramatic change in habits.

## 2019-02-06 NOTE — Telephone Encounter (Signed)
Left voicemail for patient. Dorris Singh, MD  Family Medicine Teaching Service

## 2019-02-06 NOTE — Telephone Encounter (Addendum)
Received call from Dr. Criss Rosales.  He was able to reach patient and explained the urgency of the situation.  He advised to go to ED now.  PT reluctant and states that she has a ride coming to get her.  Dr. Criss Rosales instructed if ride is not there in 10 minutes to call 911. Christen Bame, CMA

## 2019-02-06 NOTE — Assessment & Plan Note (Signed)
Patient with subjective shortness of breath with activity. She has no obvious lung findings and no O2 sat decrease. I believe this is likely secondary to her heart failure. Other items to consider are PE, asthma/copd, possible pneumonia. These are all very unlikely given her exam findings and her history. Is already on xarelto for her afib making pe unlikely. I had a very long discussion with the patient about her cpap. I laid out plainly that her continued non-compliance with this is the root of her respiratory and cardiac issues. I expressed concern to the patient that if she continues to be non-compliant with her cpap, her fear of her family finding her not breathing is a realistic concern and possibility. The patient explained that she will try the cpap tonight. She is also to contact cardiology and schedule an appointment with that service ASAP for heart failure management. She is to let us know if her anxiety continues to prevent her from using her cpap. - patient to wear cpap - increase lasix to 80mg  tid - cmp to evaluate kidney function - follow up with cardiology

## 2019-02-06 NOTE — ED Notes (Signed)
Admitting MD's at bedside and aware of BP , no further orders received at this time and md will put admitting orders in , pt further states " my Blood pressure is always high and it stays high "

## 2019-02-06 NOTE — Telephone Encounter (Signed)
Please see dr. Fransico Setters telephone note. Reviewed cmp and is suspicious for dka. Will recommend going to the ED for patient if able to reach. Will continue trying her today. Called phone number on file, no answer. Left message.  Guadalupe Dawn MD PGY-3 Family Medicine Resident

## 2019-02-06 NOTE — ED Provider Notes (Signed)
[facility] EMERGENCY DEPARTMENT Provider Note   CSN: [id number] Arrival date & time: 02/06/19  1428    History   Chief Complaint Chief Complaint  Patient presents with  . Abnormal Lab    HPI [name] is a 43 y.o. female who was sent into the ER for treatment of DKA. Patient was seen at her clinic yesterday for vomiting, labs returned with Hyperglycemia and a calculated Anion gap of about 21. Patient  has a past medical history of Abnormal Pap smear, Anemia, Asthma, Diabetes mellitus, Hypertension, Major depressive disorder (02/08/2012), Migraines, Morbid obesity ([place]) (02/06/2019), Obstructive sleep apnea (06/30/2015), and Polycystic ovarian disease. She was diagnosed with CHF with systolic dysfunction and an ejection fraction of 20 to 25% on 12/31/2018.  Patient states that since that time she has had severe depression, exertional dyspnea, peripheral edema.  She states that her dyspnea on exertion and peripheral edema is no worse than normal.  She has felt extremely fatigued, had difficulty sleeping and has no other specific complaints today.  She denies cough, fevers, chills.  She is complaining that she has a yeast infection at that her PCP was supposed to call something in but instead told her to come to the emergency department.      The history is provided by the patient and medical records.    Past Medical History:  Diagnosis Date  . Abnormal Pap smear   . Anemia   . Asthma   . Diabetes mellitus   . Hypertension   . 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    . Migraines   . Morbid obesity ([place]) 02/06/2019  . Obstructive sleep apnea 06/30/2015  . Polycystic ovarian disease     Patient Active Problem List   Diagnosis Date Noted  . Yeast infection 02/06/2019  . Right ventricular failure ([place]) 02/06/2019  . Morbid obesity ([place]) 02/06/2019  .  DKA, type 2 ([place]) 02/06/2019  . HFrEF (heart failure with reduced ejection fraction) ([place]) 12/31/2018  . Atrial fibrillation ([place]) 12/31/2018  . Anxiety 12/31/2018  . Nausea 12/27/2018  . Hyperlipidemia 10/30/2017  . Obstructive sleep apnea 06/30/2015  . Shortness of breath 06/29/2015  . Missed period 04/05/2013  . Anemia due to blood loss, chronic 06/27/2012  . Allergic rhinitis 06/27/2012  . Major depressive disorder 02/08/2012  . Hypertension 12/04/2011  . Dysmenorrhea 11/22/2011  . Diabetes mellitus, type 2 ([place]) 05/19/2011  . PCOS (polycystic ovarian syndrome) 05/19/2011  . Hydrosalpinx 08/21/2009    Past Surgical History:  Procedure Laterality Date  . CERVIX LESION DESTRUCTION    . CESAREAN SECTION       OB History    Gravida  2   Para  2   Term  2   Preterm      AB      Living  2     SAB      TAB      Ectopic      Multiple      Live Births  2            Home Medications    Prior to Admission medications   Medication Sig Start Date End Date Taking? Authorizing Provider  ACCU-CHEK SOFTCLIX LANCETS lancets Check fasting blood sugar daily in the morning 05/15/15   Mariel Aloe, MD  albuterol (VENTOLIN HFA) 108 (90 Base) MCG/ACT inhaler Inhale  2 puffs into the lungs every 6 (six) hours as needed for wheezing or shortness of breath. 12/25/18   Leeanne Rio, MD  carvedilol (COREG) 6.25 MG tablet Take 1 tablet (6.25 mg total) by mouth 2 (two) times daily. 01/01/19   Camnitz, Will Hassell Done, MD  furosemide (LASIX) 40 MG tablet Take 1 tablet PO three times daily 02/05/19   Guadalupe Dawn, MD  glipiZIDE (GLUCOTROL XL) 5 MG 24 hr tablet Take 1 tablet (5 mg total) by mouth daily with breakfast. 10/15/18   [name], DO  glucose blood (AGAMATRIX PRESTO TEST) test strip Use as instructed 10/23/17   Zenia Resides, MD  hydrochlorothiazide (HYDRODIURIL) 25 MG tablet Take 1 tablet (25 mg total) by mouth daily. 10/15/18   [name], DO  hydrOXYzine  (ATARAX/VISTARIL) 10 MG tablet Take 1 tablet (10 mg total) by mouth 3 (three) times daily as needed. 12/28/18   Lockamy, Christia Reading, DO  ondansetron (ZOFRAN) 4 MG tablet Take 1 tablet (4 mg total) by mouth every 8 (eight) hours as needed for nausea or vomiting. 01/18/19   Lovenia Kim, MD  rivaroxaban (XARELTO) 20 MG TABS tablet Take 1 tablet (20 mg total) by mouth daily with supper. 02/05/19   Guadalupe Dawn, MD  sacubitril-valsartan (ENTRESTO) 49-51 MG Take 1 tablet by mouth 2 (two) times daily. 02/05/19   Guadalupe Dawn, MD    Family History Family History  Problem Relation Age of Onset  . Diabetes Mother   . Hypertension Mother   . Hypertension Father   . Diabetes Father     Social History Social History   Tobacco Use  . Smoking status: Never Smoker  . Smokeless tobacco: Never Used  Substance Use Topics  . Alcohol use: No    Alcohol/week: 0.0 standard drinks  . Drug use: No     Allergies   Shellfish allergy and Metformin and related   Review of Systems Review of Systems  Ten systems reviewed and are negative for acute change, except as noted in the HPI.   Physical Exam Updated Vital Signs BP (!) 143/119   Pulse (!) 115   Temp 98.7 F (37.1 C) (Oral)   Resp 17   SpO2 100%   Physical Exam Vitals signs and nursing note reviewed.  Constitutional:      General: She is not in acute distress.    Appearance: She is well-developed. She is not diaphoretic.  HENT:     Head: Normocephalic and atraumatic.  Eyes:     General: No scleral icterus.    Conjunctiva/sclera: Conjunctivae normal.  Neck:     Musculoskeletal: Normal range of motion.  Cardiovascular:     Rate and Rhythm: Normal rate and regular rhythm.     Heart sounds: Normal heart sounds. No murmur. No friction rub. No gallop.   Pulmonary:     Effort: Pulmonary effort is normal. No respiratory distress.     Breath sounds: Normal breath sounds.  Abdominal:     General: Bowel sounds are normal. There is no  distension.     Palpations: Abdomen is soft. There is no mass.     Tenderness: There is no abdominal tenderness. There is no guarding.  Skin:    General: Skin is warm and dry.  Neurological:     Mental Status: She is alert and oriented to person, place, and time.  Psychiatric:        Behavior: Behavior normal.      ED Treatments / Results  Labs (all  labs ordered are listed, but only abnormal results are displayed) Labs Reviewed  BASIC METABOLIC PANEL - Abnormal; Notable for the following components:      Result Value   Sodium 126 (*)    Chloride 91 (*)    Glucose, Bld 569 (*)    Creatinine, Ser 1.12 (*)    Calcium 8.6 (*)    All other components within normal limits  CBC - Abnormal; Notable for the following components:   WBC 10.8 (*)    RBC 5.87 (*)    MCV 68.8 (*)    MCH 21.1 (*)    RDW 18.1 (*)    All other components within normal limits  URINALYSIS, ROUTINE W REFLEX MICROSCOPIC - Abnormal; Notable for the following components:   Specific Gravity, Urine 1.035 (*)    Glucose, UA >=500 (*)    Hgb urine dipstick SMALL (*)    Protein, ur 100 (*)    All other components within normal limits  BASIC METABOLIC PANEL - Abnormal; Notable for the following components:   Sodium 126 (*)    Chloride 95 (*)    CO2 20 (*)    Glucose, Bld 335 (*)    Calcium 8.6 (*)    All other components within normal limits  BASIC METABOLIC PANEL - Abnormal; Notable for the following components:   Sodium 130 (*)    Chloride 96 (*)    CO2 20 (*)    Glucose, Bld 183 (*)    Calcium 8.7 (*)    All other components within normal limits  CBG MONITORING, ED - Abnormal; Notable for the following components:   Glucose-Capillary 562 (*)    All other components within normal limits  CBG MONITORING, ED - Abnormal; Notable for the following components:   Glucose-Capillary 544 (*)    All other components within normal limits  CBG MONITORING, ED - Abnormal; Notable for the following components:    Glucose-Capillary 489 (*)    All other components within normal limits  CBG MONITORING, ED - Abnormal; Notable for the following components:   Glucose-Capillary 364 (*)    All other components within normal limits  SARS CORONAVIRUS 2 (HOSPITAL ORDER, [facility])  BASIC METABOLIC PANEL  BASIC METABOLIC PANEL  HIV ANTIBODY (ROUTINE TESTING W REFLEX)  BASIC METABOLIC PANEL  BASIC METABOLIC PANEL  I-STAT BETA HCG BLOOD, ED ([facility], [facility], [facility] ONLY)    EKG None  Radiology Dg Chest Port 1 View  Result Date: 02/06/2019 CLINICAL DATA:  Diabetic ketoacidosis EXAM: PORTABLE CHEST 1 VIEW COMPARISON:  February 04, 2016 FINDINGS: There is no edema or consolidation. There is mild atelectatic change in the left mid lung. There is cardiomegaly with pulmonary vascularity within normal limits. No adenopathy. No bone lesions. IMPRESSION: Atelectatic change left mid lung. No edema or consolidation. Stable cardiac enlargement. No adenopathy. Electronically Signed   By: Lowella Grip III M.D.   On: 02/06/2019 16:02    Procedures Procedures (including critical care time)  Medications Ordered in ED Medications  carvedilol (COREG) tablet 6.25 mg (6.25 mg Oral Given 02/06/19 2147)  hydrochlorothiazide (HYDRODIURIL) tablet 25 mg (has no administration in time range)  hydrOXYzine (ATARAX/VISTARIL) tablet 10 mg (has no administration in time range)  ondansetron (ZOFRAN) tablet 4 mg (has no administration in time range)  rivaroxaban (XARELTO) tablet 20 mg (has no administration in time range)  albuterol (PROVENTIL) (2.5 MG/3ML) 0.083% nebulizer solution 3 mL (has no administration in time range)  0.9 %  sodium chloride infusion ( Intravenous New Bag/Given 02/06/19 2234)  insulin glargine (LANTUS) injection 10 Units (10 Units Subcutaneous Given 02/06/19 1958)  insulin aspart (novoLOG) injection 0-9 Units (has no administration in time range)     Initial Impression / Assessment and Plan / ED  Course  I have reviewed the triage vital signs and the nursing notes.  Pertinent labs & imaging results that were available during my care of the patient were reviewed by me and considered in my medical decision making (see chart for details).  Clinical Course as of Feb 06 6  Wed Feb 06, 2019  1605 Patient does not appear to have elevated anion gap today.  Anion gap: 11 [AH]    Clinical Course User Index [AH] Margarita Mail, PA-C       Patient with hyperglycemia.  She has pseudohyponatremia in the setting of hyperglycemia.  Her covid test is negative. Slightly elevated white blood cell count. I personally reviewed the patient's 1 V CXR.  Given her hx of EF of 20%  Patient will be amitted for observation and volume status  Final Clinical Impressions(s) / ED Diagnoses   Final diagnoses:  None    ED Discharge Orders    None       Margarita Mail, PA-C 02/07/19 0007    Drenda Freeze, MD 02/15/19 1250

## 2019-02-06 NOTE — Telephone Encounter (Addendum)
I called with hyperglycemia.and spoke with the patient. I discussed her lab result with her: Hyperglycemia.   I also raised the concern about her HR which was elevated during her visit yesterday.  She said she has been throwing up all day. No other symptoms.  She might be getting into DKA which I explained to her what that meant.  I recommended immediate ED evaluation. She agreed with the plan. She will go to the ED right away.

## 2019-02-06 NOTE — Assessment & Plan Note (Signed)
Patient has not seen cardiology since establishing care. HR in 120s, BP 140/120s. Still has weight gain and 3+ pitting edema even on lasix bid dosing. Increase lasix to 80mg  tid. Told patient to schedule appointment with cardiology ASAP for further management. Had very long discussion (>15 minutes) about the importance of her cpap. - increase lasix to 80mg  tid - cardiology follow up asap - patient to try cpap

## 2019-02-06 NOTE — ED Triage Notes (Signed)
Pt was called today after having blood drawn yesterday and told to come to ED due to multiple abnormal labs. Pt had a NA level of 128 elevated blood sugar above 500.pt states she has just been feeling fatigued for over 1 week.pt denies pain.

## 2019-02-06 NOTE — Progress Notes (Signed)
HPI 43 year old female who presents for increased shortness of breath. Patient states that for the last week or so she has been having increased shortness of breath while walking up stairs and during activity. She feels like her fluid has increased. She does not have the shortness of breath at rest, she requires two pillows to sleep at night.  Of note the patient has known diastolic and systolic heart failure for which she sees cardiology. She was started on entresto for this around one month ago. She also has known afib for which she takes coreg 6.125mg  bid.  The patient has not been using her cpap. When pressed on why she has not been using it she says that it gives her acne and that she gets "claustrophobic". She has not tried it anytime within the last several months. She was recently started on atarax for anxiety which has been helping some. Patient states that she hasnt thought to try the cpap while on atarax. Her lasix does appear to be working some, as she has been urinating very frequently. She is 211.8 at today's visit, up from ~208 at last visit. She patient has been taking her lasix bid, despite being told to transition to once a day after 3 days.  The patient has not idea what her sugars are or have been running. She is not interested in discussing that today.  The patient expresses fear that she will stop breathing at night and that her family will find her dead. I asked her if that thought was scarier than the thought of wearing the cpap ad she said yes.  CC: shortness of breath   ROS:   Review of Systems See HPI for ROS.   CC, SH/smoking status, and VS noted  Objective: BP (!) 148/120   Pulse (!) 123   Wt 211 lb 9.6 oz (96 kg)   SpO2 99%   BMI 42.74 kg/m  Gen:  43 year old AA female, no acute distress, very anxious HEENT: mmm, eomi, perrla CV: tachycardic, regular rhythm, no m/r/g appreciated. 3+ pitting edema bilateral lower extremity. Resp: CTAB, no wheezes,  non-labored Abd: SNTND, BS present, no guarding or organomegaly Neuro: Alert and oriented, Speech clear, No gross deficits   Assessment and plan:  HFrEF (heart failure with reduced ejection fraction) ([place]) Patient has not seen cardiology since establishing care. HR in 120s, BP 140/120s. Still has weight gain and 3+ pitting edema even on lasix bid dosing. Increase lasix to 80mg  tid. Told patient to schedule appointment with cardiology ASAP for further management. Had very long discussion (>15 minutes) about the importance of her cpap. - increase lasix to 80mg  tid - cardiology follow up asap - patient to try cpap  Atrial fibrillation [facility]) Patient with heart rate in 120s, regular rhythm on exam. Likely need increase of coreg dose, but will defer this to cardiology. Patient is to follow up with that service asap.  Shortness of breath Patient with subjective shortness of breath with activity. She has no obvious lung findings and no O2 sat decrease. I believe this is likely secondary to her heart failure. Other items to consider are PE, asthma/copd, possible pneumonia. These are all very unlikely given her exam findings and her history. Is already on xarelto for her afib making pe unlikely. I had a very long discussion with the patient about her cpap. I laid out plainly that her continued non-compliance with this is the root of her respiratory and cardiac issues. I expressed  concern to the patient that if she continues to be non-compliant with her cpap, her fear of her family finding her not breathing is a realistic concern and possibility. The patient explained that she will try the cpap tonight. She is also to contact cardiology and schedule an appointment with that service ASAP for heart failure management. She is to let us know if her anxiety continues to prevent her from using her cpap. - patient to wear cpap - increase lasix to 80mg  tid - cmp to evaluate kidney function - follow up with  cardiology  Diabetes mellitus, type 2 Patient did not want to discuss how this was going at this visit. She states that she has not been checking her sugars. Will get cmp to see what her current glucose is, too early for a1c check. Encouraged follow up with pcp for management of this. She was ~10 a1c 1 month ago, will likely need insulin soon unless dramatic change in habits.  Yeast infection Patient with vaginal itching, stating that she knows that she has a yeast infection. Laboratory closed by the time other problems has been addressed. Will go ahead and treat with diflucan despite lack of wet prep evidence   Orders Placed This Encounter  Procedures  . Comprehensive metabolic panel    Order Specific Question:   Has the patient fasted?    Answer:   No  . CBC with Differential    Meds ordered this encounter  Medications  . furosemide (LASIX) 40 MG tablet    Sig: Take 1 tablet PO three times daily    Dispense:  60 tablet    Refill:  0  . fluconazole (DIFLUCAN) 150 MG tablet    Sig: Take 1 tablet (150 mg total) by mouth once for 1 dose.    Dispense:  1 tablet    Refill:  0  . rivaroxaban (XARELTO) 20 MG TABS tablet    Sig: Take 1 tablet (20 mg total) by mouth daily with supper.    Dispense:  30 tablet    Refill:  0  . sacubitril-valsartan (ENTRESTO) 49-51 MG    Sig: Take 1 tablet by mouth 2 (two) times daily.    Dispense:  60 tablet    Refill:  0     Guadalupe Dawn MD PGY-3 Family Medicine Resident  02/06/2019 9:01 AM

## 2019-02-06 NOTE — H&P (Addendum)
[facility] Admission History and Physical Service Pager: [phone]  Patient name: [name] Medical record number: [id number] Date of birth: 1975-11-02 Age: 43 y.o. Gender: female  Primary Care Provider: [name], DO Consultants: none Code Status: Full Preferred Emergency Contact: Grayland Ormond (dad) [phone]  Chief Complaint:   Assessment and Plan: [name] is a 43 y.o. female presenting with fatigue and SOB  . PMH is significant for DKA Type II, PCOS, HTN, HFrEF, Major Depressive Disorder, HLD  Hyperglycemia with closed AG T2DM- On admission, CBG of 562, anion gap 11, WBC 10.8, urinalysis >500 glucose, 100 protein and negative for ketones, COVID negative. Hgb A1c was 11.2 09/2017. Will recheck. She has a History of PCOS which could be contributing to her insulin resistance. She also has positive family history of T2DM. An insulin infusion was initiated in the ED with quick recovery of sugars. She currently has no nausea but continue to be drowsy. Exam shows euvolemic to hypervolemic state with LE edema pitting to mid-shin. Lungs clear. Negative abdominal tenderness to palpation. Appears a little drowsy but endorses poor sleep last night and is alert and oriented with normal neuro exam. - admit to progressive care for observation, Attending Dr Erin Hearing - insulin drip with CBGs q1hr to be discontinued 2 hours after lantus is given. - sSSI - Start Lantus 10u [place] daily which will likely need to be increased - Hgb A1c - lipid panel (last one 09/2017 shoed cholesterol 266, HDL 43, LDL 195) - Monitor BMP q4h - Keep potassium >3.5 - Gentle hydration due to EF 20-25% - diabetes coordinator for education - close follow up with PCP - holding home glipizide - would recommend a statin - consider vaccines as is now relatively immunocompromised.  - ECG - for discharge- would recommend another PO/injectable for heart protection  HFrEF ECHO 12/31/2018- LVEF  20-25% with severely reduced systolic function.  She does report SOB but I do not think this is an acute exacerbation of CHF given that her chest xray shows no increase in pulmonary vascular congestion and her lungs sound clear with good O2 saturation ORA.  She has been noncompliant with her CPAP at night which could also be contributing her increasing fatigue. She has bilateral lower extremity 3+ pitting edema and her home dose of Lasix was increased to 80 mg TID. Patient has general DOE but does not endorse an increase in severity and can still walk same distance without SOB as she does at baseline. Given that her Cr is elevated at 1.12 and clear breath sounds on exam  will hold her lasix overnight. Pregnancy test was negative on admission.  Continue her home meds, Coreg - CPAP at night - daily weights - strict I/O - f/u ECG - restart diuresis when AKI resolves and off IV fluids - continue home carvedilol - restart HCTZ, entresto, and lasix when kidneys and vitals can tolerate - assess patient's birth control method and whether she needs to be started on a long-term method since she is on several medications that can be teratogenic.   HTN Current BP 152/118, continue home med hydrodiuril - Monitor BP - continue home carvedilol - restart HCTZ, entresto, and lasix when kidneys and vitals can tolerate  pseudohyponatremia 2/2 hyperglycemia. Na+ 126 on admission with 569 glucose. Improving with sugar correction and will likely resolve. - BMP q4hrs  AKI Cr 1.10 on admission -Avoid nephrotoxic meds - gentle IV fluids while on insulin gtt - encourage PO fluid intake  Asthma - continue home albuterol PRN  FEN/GI: carb- modified diet with encourage PO fluid intake Prophylaxis:Xarelto  Disposition: Admit to progressive to Attending Dr Erin Hearing  History of Present Illness:  [name] is a 42 y.o. female presenting with fatigue and shortness of breath.  She reports having been feeling  unwell for 5 days.  She was seen in clinic yesterday for increasing SOB worsening during activity. Pt seen in clinic yesterday and was noted to have a serum glucose of 600 and AG 18. She was contacted by her PCP to seek medical attention for DKA.  She reports drinking a lot of apple juice and sodas and increase in urination.  She states that she was told she had a yeast infection but did not pick up her medication from pharm. She also reports that she feels like she is retaining water and that her feet have swollen. She is noncompliant with her home CPAP. A critical result from labs yesterday prompted a call to her to seek medical attention.  Her Serum glucose was over 600 with an AG 18. In the ED today her glucose was 569 and AG 11. She was started on an insulin drip and given little fluids due to her HHrEF- last ECHO 12/31/2018 with EF 20-25%. She reports that she has tried Metformin and had an allergic reaction (GI distress). She was therefore started on Glipizide and has not been compliant with her medication. Has been resistant to suggestions to starting insulin in the past but is apprehensively agreeable at this time.  Review Of Systems: Per HPI with the following additions:   Review of Systems  Constitutional: Positive for malaise/fatigue. Negative for chills, fever and weight loss.  HENT: Negative for hearing loss.   Eyes: Negative for blurred vision and double vision.  Respiratory: Positive for shortness of breath. Negative for cough, hemoptysis, sputum production and wheezing.   Cardiovascular: Positive for orthopnea and leg swelling. Negative for chest pain, palpitations and claudication.  Gastrointestinal: Positive for nausea. Negative for abdominal pain, diarrhea, heartburn and vomiting.  Genitourinary: Positive for frequency. Negative for dysuria.  Musculoskeletal: Negative for falls and myalgias.  Skin: Negative for itching and rash.  Neurological: Negative for dizziness and headaches.   Endo/Heme/Allergies: Positive for polydipsia. Does not bruise/bleed easily.  Psychiatric/Behavioral: Positive for depression. The patient is not nervous/anxious.     Patient Active Problem List   Diagnosis Date Noted  . Yeast infection 02/06/2019  . Right ventricular failure ([place]) 02/06/2019  . Morbid obesity ([place]) 02/06/2019  . HFrEF (heart failure with reduced ejection fraction) ([place]) 12/31/2018  . Atrial fibrillation ([place]) 12/31/2018  . Anxiety 12/31/2018  . Nausea 12/27/2018  . Hyperlipidemia 10/30/2017  . Obstructive sleep apnea 06/30/2015  . Shortness of breath 06/29/2015  . Missed period 04/05/2013  . Anemia due to blood loss, chronic 06/27/2012  . Allergic rhinitis 06/27/2012  . Major depressive disorder 02/08/2012  . Hypertension 12/04/2011  . Dysmenorrhea 11/22/2011  . Diabetes mellitus, type 2 ([place]) 05/19/2011  . PCOS (polycystic ovarian syndrome) 05/19/2011  . Hydrosalpinx 08/21/2009    Past Medical History: Past Medical History:  Diagnosis Date  . Abnormal Pap smear   . Anemia   . Asthma   . Diabetes mellitus   . Hypertension   . 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    . Migraines   . Morbid obesity ([place]) 02/06/2019  .  Obstructive sleep apnea 06/30/2015  . Polycystic ovarian disease     Past Surgical History: Past Surgical History:  Procedure Laterality Date  . CERVIX LESION DESTRUCTION    . CESAREAN SECTION      Social History: Social History   Tobacco Use  . Smoking status: Never Smoker  . Smokeless tobacco: Never Used  Substance Use Topics  . Alcohol use: No    Alcohol/week: 0.0 standard drinks  . Drug use: No   Additional social history: lives at home with father  Family History: Family History  Problem Relation Age of Onset  . Diabetes Mother   . Hypertension Mother   . Hypertension Father   . Diabetes Father      Allergies and Medications: Allergies  Allergen Reactions  . Shellfish Allergy Swelling    Airway involvement including EMS - Has Epi-Pen  . Metformin And Related Diarrhea   No current facility-administered medications on file prior to encounter.    Current Outpatient Medications on File Prior to Encounter  Medication Sig Dispense Refill  . ACCU-CHEK SOFTCLIX LANCETS lancets Check fasting blood sugar daily in the morning 100 each 12  . albuterol (VENTOLIN HFA) 108 (90 Base) MCG/ACT inhaler Inhale 2 puffs into the lungs every 6 (six) hours as needed for wheezing or shortness of breath. 18 g 1  . carvedilol (COREG) 6.25 MG tablet Take 1 tablet (6.25 mg total) by mouth 2 (two) times daily. 180 tablet 1  . furosemide (LASIX) 40 MG tablet Take 1 tablet PO three times daily 60 tablet 0  . glipiZIDE (GLUCOTROL XL) 5 MG 24 hr tablet Take 1 tablet (5 mg total) by mouth daily with breakfast. 30 tablet 3  . glucose blood (AGAMATRIX PRESTO TEST) test strip Use as instructed 100 each 12  . hydrochlorothiazide (HYDRODIURIL) 25 MG tablet Take 1 tablet (25 mg total) by mouth daily. 90 tablet 3  . hydrOXYzine (ATARAX/VISTARIL) 10 MG tablet Take 1 tablet (10 mg total) by mouth 3 (three) times daily as needed. 30 tablet 0  . ondansetron (ZOFRAN) 4 MG tablet Take 1 tablet (4 mg total) by mouth every 8 (eight) hours as needed for nausea or vomiting. 20 tablet 0  . rivaroxaban (XARELTO) 20 MG TABS tablet Take 1 tablet (20 mg total) by mouth daily with supper. 30 tablet 0  . sacubitril-valsartan (ENTRESTO) 49-51 MG Take 1 tablet by mouth 2 (two) times daily. 60 tablet 0    Objective: BP (!) 160/126   Pulse (!) 117   Temp 98.8 F (37.1 C) (Oral)   Resp (!) 29   SpO2 100%  Exam: General: Pleasant and cooperative lady in NAD, she is drowsy but easily arousable Eyes: EOM normal, negative drainage or injection Nose: patent nares Mouth: moist mucous membranes Neck: negative cervical lymphadenopathy or  tenderness to palpation Cardiovascular: Tachycardic, no murmurs appreciated. Distal pulses strong and extremities well-perfused. Positive LE edema 2+ pitting to mid-shin Respiratory: CTAB, no wheezing, no crackles Gastrointestinal: obese, soft, nontender, BS present MSK: good strength, pulses present  Derm: no rashes, no abrasions Neuro: A&Ox4. No focal deficits.  Psych: flat affect, non   Labs and Imaging: CBC BMET  Recent Labs  Lab 02/06/19 1439  WBC 10.8*  HGB 12.4  HCT 40.4  PLT 181   Recent Labs  Lab 02/06/19 1439  NA 126*  K 4.7  CL 91*  CO2 24  BUN 11  CREATININE 1.12*  GLUCOSE 569*  CALCIUM 8.6*     EKG:  Dg  Chest Port 1 View  Result Date: 02/06/2019 CLINICAL DATA:  Diabetic ketoacidosis EXAM: PORTABLE CHEST 1 VIEW COMPARISON:  February 04, 2016 FINDINGS: There is no edema or consolidation. There is mild atelectatic change in the left mid lung. There is cardiomegaly with pulmonary vascularity within normal limits. No adenopathy. No bone lesions. IMPRESSION: Atelectatic change left mid lung. No edema or consolidation. Stable cardiac enlargement. No adenopathy. Electronically Signed   By: Lowella Grip III M.D.   On: 02/06/2019 16:02    Carollee Leitz, MD 02/06/2019, 6:26 PM PGY-1, [facility] Intern pager: [phone], text pages welcome  [place]    I have seen and examined this patient.     I have discussed the findings and exam with the intern and agree with the above note, which I have edited appropriately in [place]. I helped develop the management plan that is described in the resident's note, and I agree with the content.   Doristine Mango, DO PGY-2 Family Medicine Resident

## 2019-02-07 ENCOUNTER — Other Ambulatory Visit: Payer: Self-pay

## 2019-02-07 DIAGNOSIS — E1110 Type 2 diabetes mellitus with ketoacidosis without coma: Secondary | ICD-10-CM

## 2019-02-07 DIAGNOSIS — R739 Hyperglycemia, unspecified: Secondary | ICD-10-CM

## 2019-02-07 DIAGNOSIS — I5022 Chronic systolic (congestive) heart failure: Secondary | ICD-10-CM

## 2019-02-07 LAB — HIV ANTIBODY (ROUTINE TESTING W REFLEX): HIV Screen 4th Generation wRfx: NONREACTIVE

## 2019-02-07 LAB — BASIC METABOLIC PANEL
Anion gap: 11 (ref 5–15)
Anion gap: 11 (ref 5–15)
Anion gap: 12 (ref 5–15)
Anion gap: 10 (ref 5–15)
BUN: 16 mg/dL (ref 6–20)
BUN: 16 mg/dL (ref 6–20)
BUN: 18 mg/dL (ref 6–20)
BUN: 18 mg/dL (ref 6–20)
CO2: 22 mmol/L (ref 22–32)
CO2: 21 mmol/L — ABNORMAL LOW (ref 22–32)
CO2: 21 mmol/L — ABNORMAL LOW (ref 22–32)
CO2: 21 mmol/L — ABNORMAL LOW (ref 22–32)
Calcium: 8.3 mg/dL — ABNORMAL LOW (ref 8.9–10.3)
Calcium: 8.3 mg/dL — ABNORMAL LOW (ref 8.9–10.3)
Calcium: 8.3 mg/dL — ABNORMAL LOW (ref 8.9–10.3)
Calcium: 8.1 mg/dL — ABNORMAL LOW (ref 8.9–10.3)
Chloride: 95 mmol/L — ABNORMAL LOW (ref 98–111)
Chloride: 95 mmol/L — ABNORMAL LOW (ref 98–111)
Chloride: 95 mmol/L — ABNORMAL LOW (ref 98–111)
Chloride: 97 mmol/L — ABNORMAL LOW (ref 98–111)
Creatinine, Ser: 0.97 mg/dL (ref 0.44–1.00)
Creatinine, Ser: 0.92 mg/dL (ref 0.44–1.00)
Creatinine, Ser: 0.97 mg/dL (ref 0.44–1.00)
Creatinine, Ser: 0.98 mg/dL (ref 0.44–1.00)
GFR calc Af Amer: >60 mL/min (ref >60)
GFR calc Af Amer: >60 mL/min (ref >60)
GFR calc Af Amer: >60 mL/min (ref >60)
GFR calc Af Amer: >60 mL/min (ref >60)
GFR calc non Af Amer: >60 mL/min (ref >60)
GFR calc non Af Amer: >60 mL/min (ref >60)
GFR calc non Af Amer: >60 mL/min (ref >60)
GFR calc non Af Amer: >60 mL/min (ref >60)
Glucose, Bld: 286 mg/dL — ABNORMAL HIGH (ref 70–99)
Glucose, Bld: 303 mg/dL — ABNORMAL HIGH (ref 70–99)
Glucose, Bld: 358 mg/dL — ABNORMAL HIGH (ref 70–99)
Glucose, Bld: 301 mg/dL — ABNORMAL HIGH (ref 70–99)
Potassium: 4 mmol/L (ref 3.5–5.1)
Potassium: 4 mmol/L (ref 3.5–5.1)
Potassium: 4 mmol/L (ref 3.5–5.1)
Potassium: 3.6 mmol/L (ref 3.5–5.1)
Sodium: 128 mmol/L — ABNORMAL LOW (ref 135–145)
Sodium: 127 mmol/L — ABNORMAL LOW (ref 135–145)
Sodium: 128 mmol/L — ABNORMAL LOW (ref 135–145)
Sodium: 128 mmol/L — ABNORMAL LOW (ref 135–145)

## 2019-02-07 LAB — GLUCOSE, CAPILLARY
Glucose-Capillary: 303 mg/dL — ABNORMAL HIGH (ref 70–99)
Glucose-Capillary: 359 mg/dL — ABNORMAL HIGH (ref 70–99)
Glucose-Capillary: 308 mg/dL — ABNORMAL HIGH (ref 70–99)
Glucose-Capillary: 259 mg/dL — ABNORMAL HIGH (ref 70–99)

## 2019-02-07 MED ORDER — ACETAMINOPHEN 500 MG PO TABS
500.0000 mg | ORAL_TABLET | Freq: Four times a day (QID) | ORAL | Status: DC | PRN
  Administered 2019-02-07 – 2019-02-09 (×4): 500 mg via ORAL
  Filled 2019-02-07 (×5): qty 1

## 2019-02-07 MED ORDER — SACUBITRIL-VALSARTAN 49-51 MG PO TABS
1.0000 | ORAL_TABLET | Freq: Two times a day (BID) | ORAL | Status: DC
  Administered 2019-02-07 – 2019-02-09 (×4): 1 via ORAL
  Filled 2019-02-07 (×4): qty 1

## 2019-02-07 NOTE — Discharge Instructions (Addendum)
Discharge Instructions: 1. It is very important to follow up with family medicine after discharge - you have an appointment scheduled for 8:55am on Tuesday 7/21 with Dr. Tammi Klippel. 2. Please keep a sugar diary at home until follow up appointment (morning upon waking, lunch time, and evening when you go to sleep) and show this to Dr. Tammi Klippel at your appointment. 3. Please do NOT take the glipizide or sliding scale insulin (Novalog) until you get to your clinic appointment and they decide to discuss potentially adding these to your current regimem  4. You were prescribed Lantus 20 units, please take this roughly the same time each morning.            Glucose Products:  ReliOn glucose products raise low blood sugar fast. Tablets are free of fat, caffeine, sodium and gluten. They are portable and easy to carry, making it easier for people with diabetes to BE PREPARED for lows.  Glucose Tablets Available in 6 flavors  10 ct...................................... $1.00  50 ct...................................... $3.98 Glucose Shot..................................$1.48 Glucose Gel....................................$3.44  Alcohol Swabs Alcohol swabs are used to sterilize your injection site. All of our swabs are individually wrapped for maximum safety, convenience and moisture retention. ReliOn Alcohol Swabs  100 ct Swabs..............................$1.00  400 ct Swabs..............................$3.74  Lancets ReliOn offers three lancet options conveniently designed to work with almost every lancing device. Each features a protective disk, which guarantees sterility before testing. ReliOn Lancets  100 ct Lancets $1.56  200 ct Lancets $2.64 Available in Ultra-Thin, Thin & Micro-Thin ReliOn 2-IN-1 Lancing Device  50 ct Lancets..................................... $3.44 Available in 30 gauge and 25 gauge ReliOn Lancing Device....................$5.84  Blood Glucose  Monitors ReliOn offers a full range of blood glucose testing options to provide an accurate, affordable system that meets each person's unique needs and preferences. Prime Meter....................................... $9.00 Prime Test Strips  25 test strips.................................... $5.00  50 test strips.................................... $9.00  100 test strips.................................$17.88 Premier [employer] Meter    $18.98 Premier Voice Meter  .  $14.98 Premier Test Strips  50 test strips.................................... $9.00  100 test strips.................................$17.88 Premier [employer] Kit    $19.44 Kit includes:  50 test strips  10 lancets  Lancing device  Carry case  Ketone Test Strips  50 test strips  ..  $6.64  Human Insulin  Novolin/ReliOn (recombinant DNA origin) is manufactured for [employer] by [employer] Insulin* with Vial..........$24.88 Available in N, R, 70/30 Novolin/ReliOn Insulin Pens*    $42.88 Available in N, R, and 70/30  Insulin Delivery ReliOn syringes and pen needles provide precision technology, comfort and accuracy in insulin delivery at affordable prices. ReliOn Pen Needles*  50 ct....................................................$9.00 Available in 29m, 675m 19m13m 41m33mliOn Insulin Syringes*  100 ct . $12.58 Available in 29G, 30G & 31G (3/10cc, 1/2cc & 1cc units)

## 2019-02-07 NOTE — Progress Notes (Signed)
Family Medicine Teaching Service Daily Progress Note Intern Pager: [phone]  Patient name: [name] Medical record number: [id number] Date of birth: 08/12/1975 Age: 43 y.o. Gender: female  Primary Care Provider: [name], DO Consultants: none  Code Status: Full  Pt Overview and Major Events to Date: A 07/15- Admitted   Assessment and Plan:  HHS improving Pt reports no n/v, but continues to not feel good. She denies any worsening of breathing or chest pain.  Her CBG's are continuing to improve Labs today show closed AG, Na 131(corrected for hyperglycemia) K 3.6.   Plan to continue to monitor BMP  Maintain K>3.5  Lantus 10u daily sSS  A1C pending oral hydration due to HFrEF Diabetic education for management of insulin on when ready for discharge  Pseudohyponatremia secondary to hyperglycemia Serum Na 128,  Na 131 corrected for hyperglycemia  HFrEF Denies any SOB.  On room air. She reports the swelling in her legs is not improving.  Lung exam CTAB. Cr 0.97 Start home med [employer] Intake and output Daily weights EKG  HTN Current BP 132/97 Continue home med Coreg  AKI Resolved Cr 0.97  Asthma Continue home albuterol prn  FEN/GI: Modified  carb PPx: Xarelto  Disposition: will need diabetic education initiated prior to discharge  Subjective:  Pt seen and assessed.  More alert today. She does not report and chest pain or worsening SOB. Denies any abdo pain, n/v. Appetite good.  Voiding well. Pt states she would like to have all medical issues taken care of prior to discharge.  Objective: Temp:  [97.6 F (36.4 C)-98.7 F (37.1 C)] 97.7 F (36.5 C) (07/16 1236) Pulse Rate:  [90-121] 100 (07/16 1236) Resp:  [16-30] 16 (07/16 1236) BP: (101-160)/(76-126) 132/97 (07/16 1236) SpO2:  [98 %-100 %] 99 % (07/16 1236)  General: Alert and oriented, no apparent distress  Eyes: PEERLA Neck: nontender Cardiovascular: RRR with no murmurs  noted Respiratory: CTA bilaterally  Gastrointestinal: Obese, soft , nondistended, bowel sounds present. No abdominal pain MSK: Upper extremity strength 5/5 bilaterally, Lower extremity swelling bilaterally right slightly >left Derm: No rashes noted Neuro: A&Ox4 Psych:Flat affect, decreased mood, speech appropriate to situation  Laboratory: Recent Labs  Lab 02/05/19 1729 02/06/19 1439  WBC 9.9 10.8*  HGB 12.1 12.4  HCT 42.6 40.4  PLT 202 181   Recent Labs  Lab 02/05/19 1729  02/07/19 0238 02/07/19 0659 02/07/19 1044  NA 128*   < > 128* 127* 128*  K 5.3*   < > 4.0 4.0 4.0  CL 91*   < > 95* 95* 95*  CO2 21   < > 22 21* 21*  BUN 12   < > 16 16 18   CREATININE 1.10*   < > 0.97 0.92 0.97  CALCIUM 9.0   < > 8.3* 8.3* 8.3*  PROT 7.3  --   --   --   --   BILITOT 1.6*  --   --   --   --   ALKPHOS 201*  --   --   --   --   ALT 25  --   --   --   --   AST 38  --   --   --   --   GLUCOSE 603*   < > 286* 303* 358*   < > = values in this interval not displayed.    EKG no changes from previous EKG 01/01/19 Imaging/Diagnostic Tests: Dg Chest Port 1 View  Result Date:  02/06/2019 CLINICAL DATA:  Diabetic ketoacidosis EXAM: PORTABLE CHEST 1 VIEW COMPARISON:  February 04, 2016 FINDINGS: There is no edema or consolidation. There is mild atelectatic change in the left mid lung. There is cardiomegaly with pulmonary vascularity within normal limits. No adenopathy. No bone lesions. IMPRESSION: Atelectatic change left mid lung. No edema or consolidation. Stable cardiac enlargement. No adenopathy. Electronically Signed   By: Lowella Grip III M.D.   On: 02/06/2019 16:02    Carollee Leitz, MD 02/07/2019, 2:47 PM PGY-1, [place] Intern pager: [phone], text pages welcome

## 2019-02-07 NOTE — Progress Notes (Signed)
Inpatient Diabetes Program Recommendations  AACE/ADA: New Consensus Statement on Inpatient Glycemic Control (2015)  Target Ranges:  Prepandial:   less than 140 mg/dL      Peak postprandial:   less than 180 mg/dL (1-2 hours)      Critically ill patients:  140 - 180 mg/dL   Lab Results  Component Value Date   GLUCAP 359 (H) 02/07/2019   HGBA1C 10.5 (A) 12/25/2018    Review of Glycemic Control  Glucose in the upper 300's consider increasing Lantus to 20 units. Give additional 10 units today.   Spoke with patient about diabetes and home regimen for diabetes control. Patient reports that she is followed by her PCP at the [facility].   Discussed the need for insulin based on A1c level. Discussed affordable options and gave Reli[employer] price list from [facility].  Showed patient both vial and syringe and insulin pen ways of drawing up and delivering insulin. Patient return demonstrated vial and syringe method.  Patient will need to self inject insulin prior to d/c.  Patient reported needing a glucometer kit in addition to all of her supplies with her DM medications.  Discussed A1C results, 10.5%. Discussed glucose and A1C goals. Discussed importance of checking CBGs and maintaining good CBG control to prevent long-term and short-term complications.  Discussed impact of nutrition, exercise, stress, sickness, and medications on diabetes control. Discussed carbohydrates, carbohydrate goals per day and meal, along with portion sizes.   Encouraged patient to check glucose at least 2 times per day (fasting and alternating second check. Patient verbalized understanding of information discussed and he states that he has no further questions at this time related to diabetes.   Thanks,  Tama Headings RN, MSN, BC-ADM Inpatient Diabetes Coordinator Team Pager [phone] (8a-5p)

## 2019-02-07 NOTE — Progress Notes (Signed)
Rt note: patient states she is unable to where cpap because she claustrophobic. She has one at home she doesn't wear and felt good without it.  Patient saturations are 100% on room air and no apparent respiratory distress at this time. Will continue to monitor

## 2019-02-08 LAB — BASIC METABOLIC PANEL
Anion gap: 9 (ref 5–15)
BUN: 24 mg/dL — ABNORMAL HIGH (ref 6–20)
CO2: 21 mmol/L — ABNORMAL LOW (ref 22–32)
Calcium: 8.2 mg/dL — ABNORMAL LOW (ref 8.9–10.3)
Chloride: 96 mmol/L — ABNORMAL LOW (ref 98–111)
Creatinine, Ser: 1.01 mg/dL — ABNORMAL HIGH (ref 0.44–1.00)
GFR calc Af Amer: >60 mL/min (ref >60)
GFR calc non Af Amer: >60 mL/min (ref >60)
Glucose, Bld: 335 mg/dL — ABNORMAL HIGH (ref 70–99)
Potassium: 4.1 mmol/L (ref 3.5–5.1)
Sodium: 126 mmol/L — ABNORMAL LOW (ref 135–145)

## 2019-02-08 LAB — HEMOGLOBIN A1C
Hgb A1c MFr Bld: >15.5 % — ABNORMAL HIGH (ref 4.8–5.6)
Mean Plasma Glucose: >398 mg/dL

## 2019-02-08 LAB — GLUCOSE, CAPILLARY
Glucose-Capillary: 306 mg/dL — ABNORMAL HIGH (ref 70–99)
Glucose-Capillary: 341 mg/dL — ABNORMAL HIGH (ref 70–99)
Glucose-Capillary: 196 mg/dL — ABNORMAL HIGH (ref 70–99)
Glucose-Capillary: 173 mg/dL — ABNORMAL HIGH (ref 70–99)

## 2019-02-08 MED ORDER — INSULIN GLARGINE 100 UNIT/ML ~~LOC~~ SOLN: 20.0000 [IU] | Freq: Every day | SUBCUTANEOUS | 0 refills | Status: DC

## 2019-02-08 MED ORDER — INSULIN ASPART 100 UNIT/ML ~~LOC~~ SOLN: 0.0000 [IU] | Freq: Three times a day (TID) | SUBCUTANEOUS | 0 refills | Status: DC

## 2019-02-08 MED ORDER — SACUBITRIL-VALSARTAN 49-51 MG PO TABS: 1.0000 | ORAL_TABLET | Freq: Two times a day (BID) | ORAL | 0 refills | Status: DC

## 2019-02-08 MED ORDER — POLYETHYLENE GLYCOL 3350 17 G PO PACK
17.0000 g | PACK | Freq: Every day | ORAL | Status: DC
  Administered 2019-02-08: 17 g via ORAL
  Filled 2019-02-08 (×2): qty 1

## 2019-02-08 MED ORDER — INSULIN GLARGINE 100 UNIT/ML ~~LOC~~ SOLN
20.0000 [IU] | Freq: Every day | SUBCUTANEOUS | Status: DC
  Administered 2019-02-08 – 2019-02-09 (×2): 20 [IU] via SUBCUTANEOUS
  Filled 2019-02-08 (×2): qty 0.2

## 2019-02-08 MED ORDER — RIVAROXABAN 20 MG PO TABS: 20.0000 mg | ORAL_TABLET | Freq: Every day | ORAL | 0 refills | Status: DC

## 2019-02-08 MED FILL — ENTRESTO 49 MG-51 MG TABLET: 49-51 | 30 days supply | Qty: 60 | Fill #0

## 2019-02-08 MED FILL — NovoLOG 100 UNIT/ML SOLN: 100 | 30 days supply | Qty: 10 | Fill #0

## 2019-02-08 MED FILL — ULTICARE INS 0.3 ML 30GX1/2: 30G X 1/2" | 30 days supply | Qty: 100 | Fill #0

## 2019-02-08 MED FILL — LANTUS 100 UNITS/ML VIAL: 100 | 30 days supply | Qty: 10 | Fill #0

## 2019-02-08 MED FILL — XARELTO 20 MG TABLET: 20 | 30 days supply | Qty: 30 | Fill #0

## 2019-02-08 NOTE — Progress Notes (Addendum)
Family Medicine Teaching Service Daily Progress Note Intern Pager: [phone]  Patient name: [name] Medical record number: [id number] Date of birth: 04-23-1976 Age: 43 y.o. Gender: female  Primary Care Provider: [name], DO Consultants: none  Code Status: Full  Pt Overview and Major Events to Date: A 07/15- Admitted   Assessment and Plan:  HHS improving Pt reports no n/v, but continues to not feel good. She denies any worsening of breathing or chest pain.  Her CBG's are continuing to improve Labs today show closed AG, Na 130(corrected for hyperglycemia) K 4.1 Plan to continue to monitor BMP  Maintain K>3.5  Increase Lantus 20u daily sSS  A1C >15.5 oral hydration due to HFrEF Diabetic education for management of insulin on when ready for discharge  Pseudohyponatremia secondary to hyperglycemia Serum Na 126,  Na 130 corrected for hyperglycemia BMP in am  HFrEF Denies any SOB.  On room air. She reports the swelling in her legs is not improving.  Lung exam CTAB. Cr 1.01 Monitor Intake and output Daily weights    HTN Current BP 132/86 Continue home med Coreg   Asthma Continue home albuterol prn  FEN/GI: Modified  carb PPx: Xarelto  Disposition: will need diabetic education initiated to teach self administration of insulin injections and prior to discharge  Subjective:  Pt seen and assessed. She does not report and chest pain or worsening SOB. Reports some abd discomfort.  Describes it as having feeling to move bowels.;  No  n/v. Appetite ok.  Voiding well.   Objective: Temp:  [97.6 F (36.4 C)-98.4 F (36.9 C)] 98 F (36.7 C) (07/17 0524) Pulse Rate:  [91-100] 91 (07/17 0524) Resp:  [16-20] 20 (07/17 0524) BP: (114-139)/(84-106) 132/86 (07/17 0524) SpO2:  [99 %-100 %] 100 % (07/17 0524)  General: Alert and oriented, no apparent distress  Eyes: PEERLA ENTM: No pharyengeal erythema Neck: nontender Cardiovascular: RRR with no murmurs  noted Respiratory: CTA bilaterally  Gastrointestinal: Bowel sounds present. Diffuse tenderness to palpation, no rebound tenderness, no guarding MSK: Upper extremity strength 5/5 bilaterally, Lower extremity strength 4/5 bilaterally, bilateral lower extremity edema Neuro: A&Ox$ Psych: Flat affect, answers questions appropriately  Laboratory: Recent Labs  Lab 02/05/19 1729 02/06/19 1439  WBC 9.9 10.8*  HGB 12.1 12.4  HCT 42.6 40.4  PLT 202 181   Recent Labs  Lab 02/05/19 1729  02/07/19 1044 02/07/19 1432 02/08/19 0321  NA 128*   < > 128* 128* 126*  K 5.3*   < > 4.0 3.6 4.1  CL 91*   < > 95* 97* 96*  CO2 21   < > 21* 21* 21*  BUN 12   < > 18 18 24*  CREATININE 1.10*   < > 0.97 0.98 1.01*  CALCIUM 9.0   < > 8.3* 8.1* 8.2*  PROT 7.3  --   --   --   --   BILITOT 1.6*  --   --   --   --   ALKPHOS 201*  --   --   --   --   ALT 25  --   --   --   --   AST 38  --   --   --   --   GLUCOSE 603*   < > 358* 301* 335*   < > = values in this interval not displayed.    EKG no changes from previous EKG 01/01/19 Imaging/Diagnostic Tests: No results found.  Carollee Leitz, MD 02/08/2019, 5:48 AM  PGY-1, [place] Intern pager: [phone], text pages welcome

## 2019-02-08 NOTE — Progress Notes (Signed)
Nurse assisted pt with insulin injection x1 this morning, nurse had to hold/guide pt's hand with the syringe inorder for her to be able to stick herself with "the needle", pt will need further practice. Pt very anxious about given self injections.  Pt stated this morning that doctor told her they were going to monitor her rest of today and see how her sugars do since running in 300's, lantus insulin increased this morning.

## 2019-02-08 NOTE — Progress Notes (Signed)
RT offered to set up CPAP dream station for the pt but pt declined stating she is claustrophobic and unable to tolerate a nasal mask or a full face mask. RT expressed to pt that if she changes her mind to let RT know. Pt respiratory status stable at this time on  RA. RT will continue to monitor.

## 2019-02-08 NOTE — Progress Notes (Signed)
Pt self administered insulin this evening, pt confidence slowly increasing. Pt also able to demonstrate drawing up fluid in insulin syringe practicing for home use, only demonstrated x1 for nurse at this time. Needs practice. Sterile water bottle and insulin syringe left with pt to practice drawing up only, pt instructed not to inject fluid into skin, pt verbalized understanding.

## 2019-02-08 NOTE — Progress Notes (Signed)
CSW received consult regarding assistance with medications. RNCM provided one time-use [facility] letter for patient medications. Meds sent to [place] and will be delivered to her room. Patient stated that she can afford the medications and pay at the hospital upon delivery. [place] pharmacy aware. CSW also requested MD print a script for a glucometer for patient to take to her nearest store.   Percell Locus Liset Mcmonigle LCSW [phone]

## 2019-02-08 NOTE — Progress Notes (Signed)
Inpatient Diabetes Program Recommendations  AACE/ADA: New Consensus Statement on Inpatient Glycemic Control (2015)  Target Ranges:  Prepandial:   less than 140 mg/dL      Peak postprandial:   less than 180 mg/dL (1-2 hours)      Critically ill patients:  140 - 180 mg/dL   Lab Results  Component Value Date   GLUCAP 341 (H) 02/08/2019   HGBA1C 15.6 (H) 02/06/2019    Review of Glycemic Control  CBGs above goal of < 180 mg/dL.  Inpatient Diabetes Program Recommendations:     Increase Lantus to 25 units QD Increase Novolog to 0-15 units tidwc and hs Add meal coverage insulin - Novolog 6 units tidwc   RN has been working with pt on self-administering insulin. Would benefit from OP Diabetes Education for uncontrolled DM.  Continue to provide bedside education per RN.  Thank you. Lorenda Peck, RD, LDN, CDE Inpatient Diabetes Coordinator [phone]

## 2019-02-08 NOTE — Progress Notes (Signed)
Pt noted small amount of blood on tissue when wiping after going to bathroom, pt states she did not have a BM, but tried. RN observed the red blood on tissue in the toilet, assessed pt's anal area, did not see hemorrhoids or area of bleeding.  Unit NT assisted with bath earlier this am and noted little blood on bath cloth. Pt denies any vaginal bleeding occurring.    Pt given ordered miralax, instructed pt not to strain to have BM. Reported by night RN that pt had a small BM last night.   NT reports he observed the pt inform one of the doctors that rounded earlier.

## 2019-02-09 LAB — BASIC METABOLIC PANEL
Anion gap: 7 (ref 5–15)
BUN: 24 mg/dL — ABNORMAL HIGH (ref 6–20)
CO2: 24 mmol/L (ref 22–32)
Calcium: 8.1 mg/dL — ABNORMAL LOW (ref 8.9–10.3)
Chloride: 99 mmol/L (ref 98–111)
Creatinine, Ser: 1.06 mg/dL — ABNORMAL HIGH (ref 0.44–1.00)
GFR calc Af Amer: >60 mL/min (ref >60)
GFR calc non Af Amer: >60 mL/min (ref >60)
Glucose, Bld: 278 mg/dL — ABNORMAL HIGH (ref 70–99)
Potassium: 4.2 mmol/L (ref 3.5–5.1)
Sodium: 130 mmol/L — ABNORMAL LOW (ref 135–145)

## 2019-02-09 LAB — GLUCOSE, CAPILLARY: Glucose-Capillary: 260 mg/dL — ABNORMAL HIGH (ref 70–99)

## 2019-02-09 NOTE — Plan of Care (Signed)
  Problem: Education: Goal: Ability to describe self-care measures that may prevent or decrease complications (Diabetes Survival Skills Education) will improve Outcome: Completed/Met Goal: Individualized Educational Video(s) Outcome: Completed/Met   Problem: Coping: Goal: Ability to adjust to condition or change in health will improve Outcome: Completed/Met   Problem: Fluid Volume: Goal: Ability to maintain a balanced intake and output will improve Outcome: Completed/Met   Problem: Health Behavior/Discharge Planning: Goal: Ability to identify and utilize available resources and services will improve Outcome: Completed/Met Goal: Ability to manage health-related needs will improve Outcome: Completed/Met   Problem: Metabolic: Goal: Ability to maintain appropriate glucose levels will improve Outcome: Completed/Met   Problem: Nutritional: Goal: Maintenance of adequate nutrition will improve Outcome: Completed/Met Goal: Progress toward achieving an optimal weight will improve Outcome: Completed/Met   Problem: Skin Integrity: Goal: Risk for impaired skin integrity will decrease Outcome: Completed/Met   Problem: Tissue Perfusion: Goal: Adequacy of tissue perfusion will improve Outcome: Completed/Met   

## 2019-02-09 NOTE — Discharge Summary (Signed)
[facility] Discharge Summary  Patient name: [name] Medical record number: [id number] Date of birth: October 30, 1975 Age: 43 y.o. Gender: female Date of Admission: 02/06/2019  Date of Discharge: 02/09/19 Admitting Physician: Martyn Malay, MD  Primary Care Provider: [name], DO Consultants: None  Indication for Hospitalization: Fatigue/shortness of breath   Discharge Diagnoses/Problem List:  T2DM HFrEF HTN Asthma  Disposition: Home  Discharge Condition: Stable  Discharge Exam:  General: Alert and oriented in no apparent distress Heart: Regular rate and rhythm with no murmurs appreciated Lungs: CTA bilaterally, no wheezing Abdomen: Bowel sounds present, no abdominal pain Skin: Warm and dry Extremities: 1-2+ pitting edema bilaterally to mid-shin   Brief Hospital Course:  Patient was seen in the clinic on 7/14 and was noted to have a serum glucose of 600 and anion gap of 18. Her PCP contacted her and told her to seek medical attention out of concern for DKA. Patient was admitted to the ED 7/15 with fatigue and shortness of breath as well as malaise for 5 days. Her serum glucose was 569 and anion gap was wnl at 11. She was started on an insulin drip. Patient had a hx of HFrEF with last echo (12/31/2018) showing an ejection fraction of 20-25%. Patient admitted to increase in urination recently and that she also felt she had a yeast infection. She also reported drinking lots of soda and apple juice. She had apparently tried metformin in the past but stopped due to GI distress. She was then started on Glipizide but was not compliant. She also complained of swelling in her legs that was present on admission and thought to be a product of her heart failure. Her lungs were clear and she had no reports of shortness of breath. She was also noted to have pseudohyponatremia 2/2 hyperglycemia. Lantus was increased from 10u daily to 20u. A1C was noted to be >15.5.  Patient's lasix, entresto, and HCTZ were held during her hospital course due to AKI  (Cr. 1.12 on admission > 1.00 > 0.87>0.97>1.01 on discharge. Patient was discharged on Lantus 20 units with close outpatient follow-up scheduled.  Issues for Follow Up:  1. Patient to maintain sugar diary for PCP 2. Follow up with PCP for T2DM 3. Consider starting Jardiance in outpatient setting.  Significant Procedures: None  Significant Labs and Imaging:  Recent Labs  Lab 02/05/19 1729 02/06/19 1439  WBC 9.9 10.8*  HGB 12.1 12.4  HCT 42.6 40.4  PLT 202 181   Recent Labs  Lab 02/05/19 1729  02/07/19 0659 02/07/19 1044 02/07/19 1432 02/08/19 0321 02/09/19 0242  NA 128*   < > 127* 128* 128* 126* 130*  K 5.3*   < > 4.0 4.0 3.6 4.1 4.2  CL 91*   < > 95* 95* 97* 96* 99  CO2 21   < > 21* 21* 21* 21* 24  GLUCOSE 603*   < > 303* 358* 301* 335* 278*  BUN 12   < > 16 18 18  24* 24*  CREATININE 1.10*   < > 0.92 0.97 0.98 1.01* 1.06*  CALCIUM 9.0   < > 8.3* 8.3* 8.1* 8.2* 8.1*  ALKPHOS 201*  --   --   --   --   --   --   AST 38  --   --   --   --   --   --   ALT 25  --   --   --   --   --   --  ALBUMIN 3.1*  --   --   --   --   --   --    < > = values in this interval not displayed.     Results/Tests Pending at Time of Discharge: None  Discharge Medications:  Allergies as of 02/09/2019      Reactions   Shellfish Allergy Swelling   Airway involvement including EMS - Has Epi-Pen   Metformin And Related Diarrhea      Medication List    STOP taking these medications   glipiZIDE 5 MG 24 hr tablet Commonly known as: GLUCOTROL XL   ondansetron 4 MG tablet Commonly known as: Zofran     TAKE these medications   Accu-Chek Softclix Lancets lancets Check fasting blood sugar daily in the morning   albuterol 108 (90 Base) MCG/ACT inhaler Commonly known as: VENTOLIN HFA Inhale 2 puffs into the lungs every 6 (six) hours as needed for wheezing or shortness of breath.   carvedilol 6.25 MG  tablet Commonly known as: COREG Take 1 tablet (6.25 mg total) by mouth 2 (two) times daily.   furosemide 40 MG tablet Commonly known as: LASIX Take 1 tablet PO three times daily   glucose blood test strip Commonly known as: AgaMatrix Presto Test Use as instructed   hydrochlorothiazide 25 MG tablet Commonly known as: HYDRODIURIL Take 1 tablet (25 mg total) by mouth daily.   hydrOXYzine 10 MG tablet Commonly known as: ATARAX/VISTARIL Take 1 tablet (10 mg total) by mouth 3 (three) times daily as needed.   insulin glargine 100 UNIT/ML injection Commonly known as: LANTUS Inject 0.2 mLs (20 Units total) into the skin daily.   rivaroxaban 20 MG Tabs tablet Commonly known as: Xarelto Take 1 tablet (20 mg total) by mouth daily with supper. What changed: Another medication with the same name was added. Make sure you understand how and when to take each.   rivaroxaban 20 MG Tabs tablet Commonly known as: Xarelto Take 1 tablet (20 mg total) by mouth daily with supper. What changed: You were already taking a medication with the same name, and this prescription was added. Make sure you understand how and when to take each.   sacubitril-valsartan 49-51 MG Commonly known as: ENTRESTO Take 1 tablet by mouth 2 (two) times daily. What changed: Another medication with the same name was added. Make sure you understand how and when to take each.   sacubitril-valsartan 49-51 MG Commonly known as: ENTRESTO Take 1 tablet by mouth 2 (two) times daily. What changed: You were already taking a medication with the same name, and this prescription was added. Make sure you understand how and when to take each.       Discharge Instructions: Please refer to Patient Instructions section of EMR for full details.  Patient was counseled important signs and symptoms that should prompt return to medical care, changes in medications, dietary instructions, activity restrictions, and follow up appointments.    Follow-Up Appointments: Follow-up Information    [facility]. Go on 03/01/2019.   Why: Hospital follow up at 2:30pm with Dr. Geryl Rankins Contact information: [place] [id number] [place], [place], [place]. Go on 02/12/2019.   Specialty: Family Medicine Why: 9am Contact information: [address] [place] [address] [phone]          Dg Chest Port 1 View  Result Date: 02/06/2019 CLINICAL DATA:  Diabetic ketoacidosis EXAM: PORTABLE CHEST  1 VIEW COMPARISON:  February 04, 2016 FINDINGS: There is no edema or consolidation. There is mild atelectatic change in the left mid lung. There is cardiomegaly with pulmonary vascularity within normal limits. No adenopathy. No bone lesions. IMPRESSION: Atelectatic change left mid lung. No edema or consolidation. Stable cardiac enlargement. No adenopathy. Electronically Signed   By: Lowella Grip III M.D.   On: 02/06/2019 16:02     Lurline Del, MD 02/09/2019, 9:20 AM PGY-1, [place]

## 2019-02-09 NOTE — Progress Notes (Signed)
[name] to be discharged Home per MD order. Discussed prescriptions and follow up appointments with the patient. Prescriptions given to patient; medication list explained in detail. Patient verbalized understanding.   Skin clean, dry and intact without evidence of skin break down, no evidence of skin tears noted. IV catheter discontinued intact. Site without signs and symptoms of complications. Dressing and pressure applied. Pt denies pain at the site currently. No complaints noted.  Patient free of lines, drains, and wounds.  An After Visit Summary (AVS) was printed and given to the patient. Patient escorted via wheelchair, and discharged home via private auto.  [name], MSN, RN, [place], AGCNS

## 2019-02-09 NOTE — Progress Notes (Signed)
Noted during conversation patient seemed very hesitant with initiating multiple injections of insulin per day. However Novolog already filled through [facility] and brought to the patient yesterday. Additionally felt it would be appropriate given patient's comfort level and situation to continue with Lantus only and increase diabetic with close outpatient followup.   Retrieved Novolog from patient. Given to patient's nurse at nursing station who will return this to pharmacy for appropriate disposal.   Lurline Del, MD

## 2019-02-11 NOTE — Progress Notes (Signed)
Subjective:    Patient ID: [name], female    DOB: 30-Dec-1975, 43 y.o.   MRN: [id number]   CC: hospital follow up  HPI: Hospital Follow up Patient presenting for hospital follow-up.  Admitted from 7/15 to 7/18 for DKA.  Patient initially seen by PCP on 7/14 and found to have elevated anion gap as well as increased glucose to 600.  Was informed to go to the emergency department for likely admission.  On admission labs showed elevated glucose to 569 but anion gap was within normal limits.  Patient was initially started on insulin drip but then transition to oral medications.  Patient was started on Lantus 20 units on discharge. A1c was noted to be above 15.5.  Patient reports she has been compliant on Lantus 20 units once a day, takes it at 10 AM in the morning.  Has not been checking her sugars but she states that she was told that she does not need to check her sugars and that "the medicine just needs to get in your body".  Is not on birth control and does not desire birth control at this time as she is currently actively trying to conceive.     Objective:  BP (!) 126/100   Pulse (!) 124   SpO2 99%  Vitals and nursing note reviewed  General: well nourished, in no acute distress HEENT: normocephalic, TM's visualized bilaterally, no scleral icterus or conjunctival pallor, no nasal discharge, moist mucous membranes, good dentition without erythema or discharge noted in posterior oropharynx Neck: supple, non-tender, without lymphadenopathy Cardiac: RRR, clear S1 and S2, no murmurs, rubs, or gallops Respiratory: clear to auscultation bilaterally, no increased work of breathing Abdomen: soft, nontender, nondistended, no masses or organomegaly. Bowel sounds present Extremities: no edema or cyanosis. Warm, well perfused. 2+ radial and PT pulses bilaterally Skin: warm and dry, no rashes noted Neuro: alert and oriented, no focal deficits   Assessment & Plan:    Type 2 diabetes  mellitus ([place]) Patient reports that this is a new diagnosis however per chart review it does seem that patient has had previous prescriptions for both metformin and glipizide. Per chart review patient has seen Dr. Valentina Lucks in the past as well as prior PCP.  Was informed to start Jardiance as well.  Appears to be noncompliant. Patient is not checking her blood sugars regularly.  This is concerning as she is on Lantus 20 units every day.  Instructed patient of importance of checking sugars especially given concern of hypoglycemia with Lantus use.  Advised patient to keep a diary of fasting and postprandial sugars and bring them into her appointment with PCP in 2 weeks.  Will not change regimen at this time given concern of hypoglycemia without checking blood sugars.  Patient does not have glucometer so we will send in glucometer as well as lancets and other supplies to patient's desired pharmacy.  Will stop ACE inhibitor given that patient is currently actively trying to conceive and there is teratogenic side effects to ACE inhibitor's.  We will also not start a statin therapy given that patient is trying to conceive.  Have placed referral to diabetes education as well as diabetes nutrition education as patient seems to have difficulty understanding this diagnosis and plan.  Strict return precautions given.  Advised to follow-up in 2 weeks with PCP.  Hypertension Have discontinued patient's ACE inhibitor and have started amlodipine.  Should follow-up with PCP for further day diabetes and hypertension management.  Discussed patient with Dr. Andria Frames  Return in about 2 weeks (around 02/26/2019).   Caroline More, DO, PGY-3

## 2019-02-12 ENCOUNTER — Other Ambulatory Visit: Payer: Self-pay

## 2019-02-12 ENCOUNTER — Encounter: Payer: Self-pay | Admitting: Family Medicine

## 2019-02-12 ENCOUNTER — Ambulatory Visit (INDEPENDENT_AMBULATORY_CARE_PROVIDER_SITE_OTHER): Payer: Self-pay | Admitting: Family Medicine

## 2019-02-12 VITALS — BP 126/100 | HR 124

## 2019-02-12 DIAGNOSIS — I10 Essential (primary) hypertension: Secondary | ICD-10-CM

## 2019-02-12 DIAGNOSIS — E119 Type 2 diabetes mellitus without complications: Secondary | ICD-10-CM

## 2019-02-12 MED ORDER — ACCU-CHEK AVIVA PLUS VI STRP: ORAL_STRIP | 12 refills | Status: DC

## 2019-02-12 MED ORDER — ACCU-CHEK AVIVA PLUS W/DEVICE KIT: 1.0000 "application " | PACK | Freq: Four times a day (QID) | 0 refills | Status: DC

## 2019-02-12 MED ORDER — ACCU-CHEK SOFTCLIX LANCET DEV KIT: 1.0000 "application " | PACK | Freq: Four times a day (QID) | 0 refills | Status: DC

## 2019-02-12 MED ORDER — ACCU-CHEK SOFTCLIX LANCETS MISC: 12 refills | Status: DC

## 2019-02-12 MED ORDER — AMLODIPINE BESYLATE 5 MG PO TABS: 5.0000 mg | ORAL_TABLET | Freq: Every day | ORAL | 3 refills | Status: DC

## 2019-02-12 NOTE — Assessment & Plan Note (Signed)
Have discontinued patient's ACE inhibitor and have started amlodipine.  Should follow-up with PCP for further day diabetes and hypertension management.

## 2019-02-12 NOTE — Patient Instructions (Addendum)
  Diet Recommendations for Diabetes   Starchy (carb) foods: Bread, rice, pasta, potatoes, corn, cereal, grits, crackers, bagels, muffins, all baked goods.  (Fruits, milk, and yogurt also have carbohydrate, but most of these foods will not spike your blood sugar as the starchy foods will.)  A few fruits do cause high blood sugars; use small portions of bananas (limit to 1/2 at a time), grapes, watermelon, oranges, and most tropical fruits.    Protein foods: Meat, fish, poultry, eggs, dairy foods, and beans such as pinto and kidney beans (beans also provide carbohydrate).   1. Eat at least 3 meals and 1-2 snacks per day. Never go more than 4-5 hours while awake without eating. Eat breakfast within the first hour of getting up.   2. Limit starchy foods to TWO per meal and ONE per snack. ONE portion of a starchy  food is equal to the following:   - ONE slice of bread (or its equivalent, such as half of a hamburger bun).   - 1/2 cup of a "scoopable" starchy food such as potatoes or rice.   - 15 grams of carbohydrate as shown on food label.  3. Include at every meal: a protein food, a carb food, and vegetables and/or fruit.   - Obtain twice the volume of veg's as protein or carbohydrate foods for both lunch and dinner.   - Fresh or frozen veg's are best.   - Keep frozen veg's on hand for a quick vegetable serving.        Please keep a log of your sugars both before breakfast (fasting) and throughout the day. Bring this log with you when you follow up.   Please follow up in 2 weeks with your PCP.   Please continue to check your blood pressure and call if pressures are >140/80.   If your sugars are <120 or >250 please call us.   Go to the ED if your sugars are severely elevated or blood pressures are severely elevated.   Dr. Tammi Klippel

## 2019-02-12 NOTE — Assessment & Plan Note (Signed)
Patient reports that this is a new diagnosis however per chart review it does seem that patient has had previous prescriptions for both metformin and glipizide. Per chart review patient has seen Dr. Valentina Lucks in the past as well as prior PCP.  Was informed to start Jardiance as well.  Appears to be noncompliant. Patient is not checking her blood sugars regularly.  This is concerning as she is on Lantus 20 units every day.  Instructed patient of importance of checking sugars especially given concern of hypoglycemia with Lantus use.  Advised patient to keep a diary of fasting and postprandial sugars and bring them into her appointment with PCP in 2 weeks.  Will not change regimen at this time given concern of hypoglycemia without checking blood sugars.  Patient does not have glucometer so we will send in glucometer as well as lancets and other supplies to patient's desired pharmacy.  Will stop ACE inhibitor given that patient is currently actively trying to conceive and there is teratogenic side effects to ACE inhibitor's.  We will also not start a statin therapy given that patient is trying to conceive.  Have placed referral to diabetes education as well as diabetes nutrition education as patient seems to have difficulty understanding this diagnosis and plan.  Strict return precautions given.  Advised to follow-up in 2 weeks with PCP.

## 2019-02-19 ENCOUNTER — Inpatient Hospital Stay (HOSPITAL_COMMUNITY)
Admission: EM | Admit: 2019-02-19 | Discharge: 2019-02-25 | DRG: 292 | Disposition: A | Discharge status: Home / Self Care | Payer: Self-pay | Attending: Internal Medicine | Admitting: Internal Medicine

## 2019-02-19 ENCOUNTER — Emergency Department (HOSPITAL_COMMUNITY): Payer: Self-pay

## 2019-02-19 ENCOUNTER — Other Ambulatory Visit (HOSPITAL_COMMUNITY): Payer: Self-pay

## 2019-02-19 ENCOUNTER — Other Ambulatory Visit: Payer: Self-pay

## 2019-02-19 ENCOUNTER — Encounter (HOSPITAL_COMMUNITY): Payer: Self-pay

## 2019-02-19 DIAGNOSIS — Z794 Long term (current) use of insulin: Secondary | ICD-10-CM

## 2019-02-19 DIAGNOSIS — R0601 Orthopnea: Secondary | ICD-10-CM

## 2019-02-19 DIAGNOSIS — I5023 Acute on chronic systolic (congestive) heart failure: Secondary | ICD-10-CM

## 2019-02-19 DIAGNOSIS — E785 Hyperlipidemia, unspecified: Secondary | ICD-10-CM | POA: Diagnosis present

## 2019-02-19 DIAGNOSIS — M545 Low back pain: Secondary | ICD-10-CM | POA: Diagnosis present

## 2019-02-19 DIAGNOSIS — E662 Morbid (severe) obesity with alveolar hypoventilation: Secondary | ICD-10-CM | POA: Diagnosis present

## 2019-02-19 DIAGNOSIS — Z79899 Other long term (current) drug therapy: Secondary | ICD-10-CM

## 2019-02-19 DIAGNOSIS — I110 Hypertensive heart disease with heart failure: Principal | ICD-10-CM | POA: Diagnosis present

## 2019-02-19 DIAGNOSIS — F329 Major depressive disorder, single episode, unspecified: Secondary | ICD-10-CM | POA: Diagnosis present

## 2019-02-19 DIAGNOSIS — F40240 Claustrophobia: Secondary | ICD-10-CM | POA: Diagnosis present

## 2019-02-19 DIAGNOSIS — I5043 Acute on chronic combined systolic (congestive) and diastolic (congestive) heart failure: Secondary | ICD-10-CM

## 2019-02-19 DIAGNOSIS — E1159 Type 2 diabetes mellitus with other circulatory complications: Secondary | ICD-10-CM | POA: Diagnosis present

## 2019-02-19 DIAGNOSIS — Z833 Family history of diabetes mellitus: Secondary | ICD-10-CM

## 2019-02-19 DIAGNOSIS — I472 Ventricular tachycardia: Secondary | ICD-10-CM | POA: Diagnosis not present

## 2019-02-19 DIAGNOSIS — G4733 Obstructive sleep apnea (adult) (pediatric): Secondary | ICD-10-CM | POA: Diagnosis present

## 2019-02-19 DIAGNOSIS — Z91013 Allergy to seafood: Secondary | ICD-10-CM

## 2019-02-19 DIAGNOSIS — I5041 Acute combined systolic (congestive) and diastolic (congestive) heart failure: Secondary | ICD-10-CM | POA: Diagnosis present

## 2019-02-19 DIAGNOSIS — I152 Hypertension secondary to endocrine disorders: Secondary | ICD-10-CM | POA: Diagnosis present

## 2019-02-19 DIAGNOSIS — J45909 Unspecified asthma, uncomplicated: Secondary | ICD-10-CM | POA: Diagnosis present

## 2019-02-19 DIAGNOSIS — Z6841 Body Mass Index (BMI) 40.0 and over, adult: Secondary | ICD-10-CM

## 2019-02-19 DIAGNOSIS — I480 Paroxysmal atrial fibrillation: Secondary | ICD-10-CM | POA: Diagnosis present

## 2019-02-19 DIAGNOSIS — Z9114 Patient's other noncompliance with medication regimen: Secondary | ICD-10-CM

## 2019-02-19 DIAGNOSIS — Z20828 Contact with and (suspected) exposure to other viral communicable diseases: Secondary | ICD-10-CM | POA: Diagnosis present

## 2019-02-19 DIAGNOSIS — Z7901 Long term (current) use of anticoagulants: Secondary | ICD-10-CM

## 2019-02-19 DIAGNOSIS — E119 Type 2 diabetes mellitus without complications: Secondary | ICD-10-CM

## 2019-02-19 DIAGNOSIS — Z888 Allergy status to other drugs, medicaments and biological substances status: Secondary | ICD-10-CM

## 2019-02-19 DIAGNOSIS — E1165 Type 2 diabetes mellitus with hyperglycemia: Secondary | ICD-10-CM

## 2019-02-19 DIAGNOSIS — E1169 Type 2 diabetes mellitus with other specified complication: Secondary | ICD-10-CM | POA: Diagnosis present

## 2019-02-19 HISTORY — DX: Acute combined systolic (congestive) and diastolic (congestive) heart failure: I50.41

## 2019-02-19 HISTORY — DX: Acute on chronic combined systolic (congestive) and diastolic (congestive) heart failure: I50.43

## 2019-02-19 LAB — URINALYSIS, ROUTINE W REFLEX MICROSCOPIC
Glucose, UA: 150 mg/dL — AB
Hgb urine dipstick: NEGATIVE
Ketones, ur: NEGATIVE mg/dL
Leukocytes,Ua: NEGATIVE
Nitrite: NEGATIVE
Protein, ur: >=300 mg/dL — AB
Specific Gravity, Urine: 1.038 — ABNORMAL HIGH (ref 1.005–1.030)
pH: 5 (ref 5.0–8.0)

## 2019-02-19 LAB — COMPREHENSIVE METABOLIC PANEL
ALT: 26 U/L (ref 0–44)
AST: 39 U/L (ref 15–41)
Albumin: 2.5 g/dL — ABNORMAL LOW (ref 3.5–5.0)
Alkaline Phosphatase: 162 U/L — ABNORMAL HIGH (ref 38–126)
Anion gap: 11 (ref 5–15)
BUN: 21 mg/dL — ABNORMAL HIGH (ref 6–20)
CO2: 21 mmol/L — ABNORMAL LOW (ref 22–32)
Calcium: 8.5 mg/dL — ABNORMAL LOW (ref 8.9–10.3)
Chloride: 102 mmol/L (ref 98–111)
Creatinine, Ser: 1 mg/dL (ref 0.44–1.00)
GFR calc Af Amer: >60 mL/min (ref >60)
GFR calc non Af Amer: >60 mL/min (ref >60)
Glucose, Bld: 273 mg/dL — ABNORMAL HIGH (ref 70–99)
Potassium: 4.6 mmol/L (ref 3.5–5.1)
Sodium: 134 mmol/L — ABNORMAL LOW (ref 135–145)
Total Bilirubin: 2 mg/dL — ABNORMAL HIGH (ref 0.3–1.2)
Total Protein: 7.2 g/dL (ref 6.5–8.1)

## 2019-02-19 LAB — TROPONIN I (HIGH SENSITIVITY): Troponin I (High Sensitivity): 64 ng/L — ABNORMAL HIGH (ref <18)

## 2019-02-19 LAB — BRAIN NATRIURETIC PEPTIDE: B Natriuretic Peptide: 654.7 pg/mL — ABNORMAL HIGH (ref 0.0–100.0)

## 2019-02-19 LAB — SARS CORONAVIRUS 2 BY RT PCR (HOSPITAL ORDER, PERFORMED IN ~~LOC~~ HOSPITAL LAB): SARS Coronavirus 2: NEGATIVE

## 2019-02-19 LAB — CBG MONITORING, ED: Glucose-Capillary: 285 mg/dL — ABNORMAL HIGH (ref 70–99)

## 2019-02-19 MED ORDER — NITROGLYCERIN 2 % TD OINT
1.0000 [in_us] | TOPICAL_OINTMENT | Freq: Once | TRANSDERMAL | Status: AC
  Administered 2019-02-20: 1 [in_us] via TOPICAL

## 2019-02-19 MED ORDER — ALBUTEROL SULFATE HFA 108 (90 BASE) MCG/ACT IN AERS
1.0000 | INHALATION_SPRAY | Freq: Once | RESPIRATORY_TRACT | Status: AC
  Administered 2019-02-19: 2 via RESPIRATORY_TRACT
  Filled 2019-02-19: qty 6.7

## 2019-02-19 MED ORDER — FUROSEMIDE 10 MG/ML IJ SOLN
40.0000 mg | Freq: Once | INTRAMUSCULAR | Status: AC
  Administered 2019-02-19: 40 mg via INTRAVENOUS
  Filled 2019-02-19: qty 4

## 2019-02-19 NOTE — ED Notes (Signed)
Pt ambulated, O2 saturations were 98-100% on RA. Pt tachypneic while ambulating and c/o SHOB. EDP notified.

## 2019-02-19 NOTE — H&P (Signed)
History and Physical    [name] EXB:[id number] DOB: September 12, 1975 DOA: 02/19/2019  PCP: [name], DO  Patient coming from: Home  I have personally briefly reviewed patient's old medical records in [place]  Chief Complaint: Dyspnea  HPI: [name] is a 43 y.o. female with medical history significant for HFrEF (EF 20-25% by TTE 12/31/2018), paroxysmal atrial fibrillation on Xarelto, insulin-dependent type 2 diabetes, hypertension, hyperlipidemia, asthma, morbid obesity, and OSA who presents to the ED for evaluation of dyspnea.   Patient was recently admitted at [facility] from 02/06/2019-02/09/2019 for DKA.  Her home Lasix, Entresto, and HCTZ were held during hospital stay.  She was discharged on Lantus 20 units daily.  Patient states that when she left the hospital she was feeling better but not quite to her usual baseline.  She says she has had progressive generalized weakness and then yesterday developed new onset of shortness of breath.  She has had associated orthopnea and paroxysmal nocturnal dyspnea.  She has noticed increased swelling in both of her legs.  She reports intermittent central chest discomfort without radiation to her neck, jaw, or arms.  She reports associated fatigue.  She has felt intermittent palpitations.  She otherwise denies any subjective fevers, diaphoresis, abdominal pain, dysuria, diarrhea, constipation.  She states that she has been taking her medications as prescribed.  ED Course:  Initial vitals showed BP 144/113, pulse 125, RR 19, temp 97.6 Fahrenheit, SPO2 99% on room air.  Labs notable for sodium 134, potassium 4.6, bicarb 21, BUN 21, creatinine 1.00, serum glucose 273, BNP 654.7, high-sensitivity troponin 64.  Urinalysis showed negative nitrites, negative leukocytes, negative ketones, >300 protein, rare bacteria on microscopy.  SARS-CoV-2 test was negative.  2 view chest x-ray showed cardiomegaly without focal consolidation,  effusion, or significant pulmonary edema.  Patient was given IV Lasix 40 mg once and albuterol inhaler treatment.  The hospitalist service was consulted to admit for further evaluation management.   Review of Systems: All systems reviewed and are negative except as documented in history of present illness above.   Past Medical History:  Diagnosis Date  . Abnormal Pap smear   . Anemia   . Asthma   . Diabetes mellitus   . Hypertension   . 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    . Migraines   . Morbid obesity ([place]) 02/06/2019  . Obstructive sleep apnea 06/30/2015  . Polycystic ovarian disease     Past Surgical History:  Procedure Laterality Date  . CERVIX LESION DESTRUCTION    . CESAREAN SECTION      Social History:  reports that she has never smoked. She has never used smokeless tobacco. She reports that she does not drink alcohol or use drugs.  Allergies  Allergen Reactions  . Shellfish Allergy Swelling    Airway involvement including EMS - Has Epi-Pen  . Metformin And Related Diarrhea    Family History  Problem Relation Age of Onset  . Diabetes Mother   . Hypertension Mother   . Hypertension Father   . Diabetes Father      Prior to Admission medications   Medication Sig Start Date End Date Taking? Authorizing Provider  albuterol (VENTOLIN HFA) 108 (90 Base) MCG/ACT inhaler Inhale 2 puffs into the lungs every 6 (six) hours as needed for wheezing or shortness of breath. 12/25/18  Yes Chrisandra Netters  J, MD  amLODipine (NORVASC) 5 MG tablet Take 1 tablet (5 mg total) by mouth at bedtime. 02/12/19  Yes Tammi Klippel, Sherin, DO  carvedilol (COREG) 6.25 MG tablet Take 1 tablet (6.25 mg total) by mouth 2 (two) times daily. 01/01/19  Yes Camnitz, Will Hassell Done, MD  furosemide (LASIX) 40 MG tablet Take 1 tablet PO three times daily Patient taking differently: Take 40 mg by  mouth 3 (three) times daily.  02/05/19  Yes Guadalupe Dawn, MD  hydrochlorothiazide (HYDRODIURIL) 25 MG tablet Take 1 tablet (25 mg total) by mouth daily. 10/15/18  Yes [name], DO  hydrOXYzine (ATARAX/VISTARIL) 10 MG tablet Take 1 tablet (10 mg total) by mouth 3 (three) times daily as needed. Patient taking differently: Take 10 mg by mouth 3 (three) times daily as needed for anxiety.  12/28/18  Yes Lockamy, Timothy, DO  insulin glargine (LANTUS) 100 UNIT/ML injection Inject 0.2 mLs (20 Units total) into the skin daily. 02/09/19  Yes Carollee Leitz, MD  rivaroxaban (XARELTO) 20 MG TABS tablet Take 1 tablet (20 mg total) by mouth daily with supper. 02/05/19  Yes Guadalupe Dawn, MD  ACCU-CHEK [facility] LANCETS lancets Check fasting blood sugar daily in the morning 05/15/15   Mariel Aloe, MD  Accu-Chek Softclix Lancets lancets Use as instructed 02/12/19   Caroline More, DO  Blood Glucose Monitoring Suppl (ACCU-CHEK AVIVA PLUS) w/Device KIT 1 application by Does not apply route QID. Check fasting sugars as well as throughout the day 02/12/19   Caroline More, DO  glucose blood (ACCU-CHEK AVIVA PLUS) test strip Use as instructed 02/12/19   Caroline More, DO  glucose blood (AGAMATRIX PRESTO TEST) test strip Use as instructed 10/23/17   Zenia Resides, MD  Lancets Misc. (ACCU-CHEK SOFTCLIX LANCET DEV) KIT 1 application by Does not apply route 4 (four) times daily. 02/12/19   Caroline More, DO  rivaroxaban (XARELTO) 20 MG TABS tablet Take 1 tablet (20 mg total) by mouth daily with supper. 02/08/19   Carollee Leitz, MD    Physical Exam: Vitals:   02/19/19 2200 02/19/19 2246 02/19/19 2330 02/20/19 0030  BP: (!) 161/129  (!) 141/116 (!) 134/108  Pulse: (!) 121  (!) 113 (!) 115  Resp: (!) 22  (!) 22 20  Temp:      TempSrc:      SpO2: 98% 98% 96% 98%    Constitutional: Obese woman resting in bed with head elevated to 30 degrees, NAD, calm, comfortable Eyes: PERRL, lids and conjunctivae normal ENMT:  Mucous membranes are moist. Posterior pharynx clear of any exudate or lesions.Normal dentition.  Neck: normal, supple, no masses. Respiratory: clear to auscultation bilaterally, no wheezing, no crackles. Normal respiratory effort. No accessory muscle use.  Cardiovascular: Tachycardic with regular rhythm, no murmurs / rubs / gallops.  +2 bilateral lower extremity edema. 2+ pedal pulses. Abdomen: Obese abdomen, no tenderness, no masses palpated. No hepatosplenomegaly. Bowel sounds positive.  Musculoskeletal: no clubbing / cyanosis. No joint deformity upper and lower extremities. Good ROM, no contractures. Normal muscle tone.  Skin: no rashes, lesions, ulcers. No induration Neurologic: CN 2-12 grossly intact. Sensation intact, Strength 5/5 in all 4.  Psychiatric: Normal judgment and insight. Alert and oriented x 3. Normal mood.     Labs on Admission: I have personally reviewed following labs and imaging studies  CBC: No results for input(s): WBC, NEUTROABS, HGB, HCT, MCV, PLT in the last 168 hours. Basic Metabolic Panel: Recent Labs  Lab 02/19/19 2026  NA 134*  K 4.6  CL 102  CO2 21*  GLUCOSE 273*  BUN 21*  CREATININE 1.00  CALCIUM 8.5*   GFR: Estimated Creatinine Clearance: 76.1 mL/min (by C-G formula based on SCr of 1 mg/dL). Liver Function Tests: Recent Labs  Lab 02/19/19 2026  AST 39  ALT 26  ALKPHOS 162*  BILITOT 2.0*  PROT 7.2  ALBUMIN 2.5*   No results for input(s): LIPASE, AMYLASE in the last 168 hours. No results for input(s): AMMONIA in the last 168 hours. Coagulation Profile: No results for input(s): INR, PROTIME in the last 168 hours. Cardiac Enzymes: No results for input(s): CKTOTAL, CKMB, CKMBINDEX, TROPONINI in the last 168 hours. BNP (last 3 results) No results for input(s): PROBNP in the last 8760 hours. HbA1C: No results for input(s): HGBA1C in the last 72 hours. CBG: Recent Labs  Lab 02/19/19 1558  GLUCAP 285*   Lipid Profile: No results for  input(s): CHOL, HDL, LDLCALC, TRIG, CHOLHDL, LDLDIRECT in the last 72 hours. Thyroid Function Tests: No results for input(s): TSH, T4TOTAL, FREET4, T3FREE, THYROIDAB in the last 72 hours. Anemia Panel: No results for input(s): VITAMINB12, FOLATE, FERRITIN, TIBC, IRON, RETICCTPCT in the last 72 hours. Urine analysis:    Component Value Date/Time   COLORURINE AMBER (A) 02/19/2019 2058   APPEARANCEUR HAZY (A) 02/19/2019 2058   LABSPEC 1.038 (H) 02/19/2019 2058   PHURINE 5.0 02/19/2019 2058   GLUCOSEU 150 (A) 02/19/2019 2058   HGBUR NEGATIVE 02/19/2019 2058   BILIRUBINUR SMALL (A) 02/19/2019 2058   BILIRUBINUR NEG 04/05/2013 1057   KETONESUR NEGATIVE 02/19/2019 2058   PROTEINUR >=300 (A) 02/19/2019 2058   UROBILINOGEN 0.2 08/09/2013 2031   NITRITE NEGATIVE 02/19/2019 2058   LEUKOCYTESUR NEGATIVE 02/19/2019 2058    Radiological Exams on Admission: Dg Chest 2 View  Result Date: 02/19/2019 CLINICAL DATA:  Shortness of breath EXAM: CHEST - 2 VIEW COMPARISON:  February 06, 2019 FINDINGS: Lungs are clear. Heart is enlarged with pulmonary vascularity normal. No adenopathy. No bone lesions. IMPRESSION: Cardiomegaly.  No edema or consolidation. Electronically Signed   By: Lowella Grip III M.D.   On: 02/19/2019 17:12    EKG: Independently reviewed. Sinus tachycardia with low voltage.  Previous T wave inversions and lateral leads are less pronounced/normalized when compared to prior EKG.  Assessment/Plan Principal Problem:   Acute on chronic HFrEF (heart failure with reduced ejection fraction) (HCC) Active Problems:   Type 2 diabetes mellitus ([place])   Hypertension associated with diabetes ([place])   Obstructive sleep apnea   Hyperlipidemia associated with type 2 diabetes mellitus (HCC)   Paroxysmal atrial fibrillation ([place])   Asthma  [name] is a 43 y.o. female with medical history significant for HFrEF (EF 20-25% by TTE 12/31/2018), paroxysmal atrial fibrillation on Xarelto,  insulin-dependent type 2 diabetes, hypertension, hyperlipidemia, asthma, morbid obesity, and OSA who is admitted with acute on chronic heart failure exacerbation.   Acute on chronic heart failure with reduced ejection fraction: EF 20-25% by TTE 12/31/2018. Received IV Lasix 40 mg in the ED with good urine output so far (not recorded).  Previously on Entresto, which appears to have been discontinued by her primary care as she is reportedly trying to conceive.  Will check urine pregnancy test. -Continue IV Lasix 40 mg twice daily -Continue carvedilol 6.25 mg p.o. twice daily -Monitor daily weights and strict I/O's -Supplement potassium, monitor electrolytes and renal function  Paroxysmal atrial fibrillation: CHA2DS2-VASc Score is at least 4.  Rhythm is sinus tachycardia on admission. -Continue home carvedilol -Continue  Xarelto  Insulin-dependent type 2 diabetes with hyperglycemia: A1c >15.5 on 02/06/2019.  Previously documented nonadherence to home medications. -Continue with Lantus 20 units plus SSI  Hypertension: Hypertensive on admission. -Continue home carvedilol, amlodipine, and Lasix diuresis as above  Hyperlipidemia: Home statin recently discontinued by her primary care physician reportedly try to conceive.  Asthma: No active wheezing on admission.  Continue albuterol as needed.  OSA: Patient states she is not using her CPAP due to claustrophobia.  She has tried nasal route and reports intolerance.  She is not willing to try CPAP here.  Use supplemental O2 via [place] nightly.  DVT prophylaxis: Xarelto Code Status: Full code, confirmed with patient Family Communication: Discussed with patient, she has communicated to family Disposition Plan: Pending clinical progress Consults called: None Admission status: Admit - It is my clinical opinion that admission to INPATIENT is reasonable and necessary because of the expectation that this patient will require hospital care that crosses at  least 2 midnights to treat this condition based on the medical complexity of the problems presented.  Given the aforementioned information, the predictability of an adverse outcome is felt to be significant.    Zada Finders MD [facility]  If 7PM-7AM, please contact night-coverage [email or web address]  02/20/2019, 12:39 AM

## 2019-02-19 NOTE — ED Triage Notes (Addendum)
Patient c/o shob X3 days. Patient states today shob got worse and decided to come to ED.   Patient denies using her inhaler today. States she does have one.   Patient states her chest hurts when she breathes in 6/10.    Patient states she was recently admitted for shob and DM.      A/Ox4 Ambulatory into ED.    Hx. DM and Asthma

## 2019-02-19 NOTE — ED Notes (Signed)
Attempted IV stick for second IV site and to obtain Lactic Acid. IV stick unsuccessful.

## 2019-02-19 NOTE — ED Provider Notes (Signed)
[facility] DEPT Provider Note   CSN: [id number] Arrival date & time: 02/19/19  1538    History   Chief Complaint Chief Complaint  Patient presents with  . Shortness of Breath    HPI [name] is a 43 y.o. female with h/o T2DM on insulin, HTN, obesity, asthma, systolic HF EF 54-49%, OSA on CPAP presents to the ER for evaluation of shortness of breath.  Associated with nausea, generalized weakness, chest pain, low back pain.  Onset 2 days ago worsening today.  Reports constant shortness of breath but worse with exertion.  She feels like she has to breathe faster to get enough air.  She has had orthopnea and is trying to sit up to help with her breathing.  Has had intermittent, sharp, pressure in the middle of her chest for the last 2 days that is worse with exertion, movement, breathing, turning over in bed.  Today she woke up to try and shower and felt lightheaded like she was going to pass out.   She has had leg and feet swelling that she states is the same from discharge.  She states she is on a fluid pill and she takes it once daily although at discharge her prescription was for furosemide 40 mg 3 times daily.  States she has many medicines and does not know exactly what she takes.  She was never told she had to monitor her fluid intake or weight at home. Also noticed some diffuse low back pain that is constant, non positional.  Recently admitted to the hospital for hyperglycemia and started on Lantus.  Her CBGs at home have been around 200s.  No fever, cough, hemoptysis, vomiting, abdominal, diarrhea, constipation, dysuria, hematuria frequency.  No sick contacts. No exposure to COVID.  She denies asymmetric leg edema or calf pain. No h/o DVT/PE. No tobacco use.     HPI  Past Medical History:  Diagnosis Date  . Abnormal Pap smear   . Anemia   . Asthma   . Diabetes mellitus   . Hypertension   . Major depressive disorder 02/08/2012   Reports that her  infertility is a major cause of her depression Reports Prozac has worked in the past for her.    . Migraines   . Morbid obesity ([place]) 02/06/2019  . Obstructive sleep apnea 06/30/2015  . Polycystic ovarian disease     Patient Active Problem List   Diagnosis Date Noted  . Acute on chronic HFrEF (heart failure with reduced ejection fraction) ([place]) 02/19/2019  . Asthma   . Hyperglycemia   . Yeast infection 02/06/2019  . Right ventricular failure ([place]) 02/06/2019  . Morbid obesity ([place]) 02/06/2019  . DKA, type 2 ([place]) 02/06/2019  . HFrEF (heart failure with reduced ejection fraction) ([place]) 12/31/2018  . Paroxysmal atrial fibrillation ([place]) 12/31/2018  . Anxiety 12/31/2018  . Nausea 12/27/2018  . Hyperlipidemia associated with type 2 diabetes mellitus ([place]) 10/30/2017  . Obstructive sleep apnea 06/30/2015  . Shortness of breath 06/29/2015  . Missed period 04/05/2013  . Anemia due to blood loss, chronic 06/27/2012  . Allergic rhinitis 06/27/2012  . Major depressive disorder 02/08/2012  . Hypertension associated with diabetes ([place]) 12/04/2011  . Dysmenorrhea 11/22/2011  . Type 2 diabetes mellitus ([place]) 05/19/2011  . PCOS (polycystic ovarian syndrome) 05/19/2011  . Hydrosalpinx 08/21/2009    Past Surgical History:  Procedure Laterality Date  . CERVIX LESION DESTRUCTION    . CESAREAN SECTION  OB History    Gravida  2   Para  2   Term  2   Preterm      AB      Living  2     SAB      TAB      Ectopic      Multiple      Live Births  2            Home Medications    Prior to Admission medications   Medication Sig Start Date End Date Taking? Authorizing Provider  albuterol (VENTOLIN HFA) 108 (90 Base) MCG/ACT inhaler Inhale 2 puffs into the lungs every 6 (six) hours as needed for wheezing or shortness of breath. 12/25/18  Yes Leeanne Rio, MD  amLODipine (NORVASC) 5 MG tablet Take 1 tablet (5 mg total) by mouth at bedtime. 02/12/19  Yes Tammi Klippel,  Sherin, DO  carvedilol (COREG) 6.25 MG tablet Take 1 tablet (6.25 mg total) by mouth 2 (two) times daily. 01/01/19  Yes Camnitz, Will Hassell Done, MD  furosemide (LASIX) 40 MG tablet Take 1 tablet PO three times daily Patient taking differently: Take 40 mg by mouth 3 (three) times daily.  02/05/19  Yes Guadalupe Dawn, MD  hydrochlorothiazide (HYDRODIURIL) 25 MG tablet Take 1 tablet (25 mg total) by mouth daily. 10/15/18  Yes [name], DO  hydrOXYzine (ATARAX/VISTARIL) 10 MG tablet Take 1 tablet (10 mg total) by mouth 3 (three) times daily as needed. Patient taking differently: Take 10 mg by mouth 3 (three) times daily as needed for anxiety.  12/28/18  Yes Lockamy, Timothy, DO  insulin glargine (LANTUS) 100 UNIT/ML injection Inject 0.2 mLs (20 Units total) into the skin daily. 02/09/19  Yes Carollee Leitz, MD  rivaroxaban (XARELTO) 20 MG TABS tablet Take 1 tablet (20 mg total) by mouth daily with supper. 02/05/19  Yes Guadalupe Dawn, MD  ACCU-CHEK [facility] LANCETS lancets Check fasting blood sugar daily in the morning 05/15/15   Mariel Aloe, MD  Accu-Chek Softclix Lancets lancets Use as instructed 02/12/19   Caroline More, DO  Blood Glucose Monitoring Suppl (ACCU-CHEK AVIVA PLUS) w/Device KIT 1 application by Does not apply route QID. Check fasting sugars as well as throughout the day 02/12/19   Caroline More, DO  glucose blood (ACCU-CHEK AVIVA PLUS) test strip Use as instructed 02/12/19   Caroline More, DO  glucose blood (AGAMATRIX PRESTO TEST) test strip Use as instructed 10/23/17   Zenia Resides, MD  Lancets Misc. (ACCU-CHEK SOFTCLIX LANCET DEV) KIT 1 application by Does not apply route 4 (four) times daily. 02/12/19   Caroline More, DO  rivaroxaban (XARELTO) 20 MG TABS tablet Take 1 tablet (20 mg total) by mouth daily with supper. 02/08/19   Carollee Leitz, MD    Family History Family History  Problem Relation Age of Onset  . Diabetes Mother   . Hypertension Mother   . Hypertension Father    . Diabetes Father     Social History Social History   Tobacco Use  . Smoking status: Never Smoker  . Smokeless tobacco: Never Used  Substance Use Topics  . Alcohol use: No    Alcohol/week: 0.0 standard drinks  . Drug use: No     Allergies   Shellfish allergy and Metformin and related   Review of Systems Review of Systems  Constitutional: Positive for fatigue.  Respiratory: Positive for shortness of breath.   Cardiovascular: Positive for chest pain and leg swelling.  Gastrointestinal: Positive for nausea.  Musculoskeletal: Positive for back pain.  Neurological: Positive for weakness (generalized) and light-headedness.  All other systems reviewed and are negative.    Physical Exam Updated Vital Signs BP (!) 161/129   Pulse (!) 121   Temp 98.9 F (37.2 C) (Rectal)   Resp (!) 22   LMP 11/23/2018   SpO2 98%   Physical Exam Vitals signs and nursing note reviewed.  Constitutional:      Appearance: She is well-developed.     Comments: Non toxic in NAD  HENT:     Head: Normocephalic and atraumatic.     Nose: Nose normal.  Eyes:     Conjunctiva/sclera: Conjunctivae normal.  Neck:     Musculoskeletal: Normal range of motion.  Cardiovascular:     Rate and Rhythm: Regular rhythm. Tachycardia present.     Comments: HR in the 110s to 120s during exam.  RRR.  1+ radial and DP pulses bilaterally.  2+ pitting edema to the mid shins, symmetric.  No calf tenderness. Pulmonary:     Effort: Pulmonary effort is normal.     Breath sounds: Decreased breath sounds present.     Comments: Tachypneic in the low 20s during exam, this increases during sitting up and movement in the bed.  Lungs are clear without wheezing, crackles although decreased air entry to the middle and lower lobes anteriorly.  Difficult exam due to body habitus.  Speaking in full sentences, no severe respiratory distress. Abdominal:     General: Bowel sounds are normal.     Palpations: Abdomen is soft.      Tenderness: There is no abdominal tenderness.     Comments: No G/R/R. No suprapubic or CVA tenderness. Negative Murphy's and McBurney's. Active BS to lower quadrants.   Musculoskeletal: Normal range of motion.     Comments: L-spine: No midline or paraspinal muscle tenderness  Skin:    General: Skin is warm and dry.     Capillary Refill: Capillary refill takes less than 2 seconds.  Neurological:     Mental Status: She is alert.     Comments: Sensation and strength intact in upper and lower extremities.  Psychiatric:        Behavior: Behavior normal.      ED Treatments / Results  Labs (all labs ordered are listed, but only abnormal results are displayed) Labs Reviewed  BRAIN NATRIURETIC PEPTIDE - Abnormal; Notable for the following components:      Result Value   B Natriuretic Peptide 654.7 (*)    All other components within normal limits  COMPREHENSIVE METABOLIC PANEL - Abnormal; Notable for the following components:   Sodium 134 (*)    CO2 21 (*)    Glucose, Bld 273 (*)    BUN 21 (*)    Calcium 8.5 (*)    Albumin 2.5 (*)    Alkaline Phosphatase 162 (*)    Total Bilirubin 2.0 (*)    All other components within normal limits  URINALYSIS, ROUTINE W REFLEX MICROSCOPIC - Abnormal; Notable for the following components:   Color, Urine AMBER (*)    APPearance HAZY (*)    Specific Gravity, Urine 1.038 (*)    Glucose, UA 150 (*)    Bilirubin Urine SMALL (*)    Protein, ur >=300 (*)    Bacteria, UA RARE (*)    All other components within normal limits  CBG MONITORING, ED - Abnormal; Notable for the following components:   Glucose-Capillary 285 (*)    All other components within  normal limits  TROPONIN I (HIGH SENSITIVITY) - Abnormal; Notable for the following components:   Troponin I (High Sensitivity) 64 (*)    All other components within normal limits  SARS CORONAVIRUS 2 (HOSPITAL ORDER, [facility])  URINE CULTURE  LACTIC ACID, PLASMA  LACTIC  ACID, PLASMA  CBC    EKG None  Radiology Dg Chest 2 View  Result Date: 02/19/2019 CLINICAL DATA:  Shortness of breath EXAM: CHEST - 2 VIEW COMPARISON:  February 06, 2019 FINDINGS: Lungs are clear. Heart is enlarged with pulmonary vascularity normal. No adenopathy. No bone lesions. IMPRESSION: Cardiomegaly.  No edema or consolidation. Electronically Signed   By: Lowella Grip III M.D.   On: 02/19/2019 17:12    Procedures .Critical Care Performed by: Kinnie Feil, PA-C Authorized by: Kinnie Feil, PA-C   Critical care provider statement:    Critical care time (minutes):  45   Critical care was necessary to treat or prevent imminent or life-threatening deterioration of the following conditions:  Cardiac failure (decompensated HF, elevated troponin)   Critical care was time spent personally by me on the following activities:  Discussions with consultants, evaluation of patient's response to treatment, examination of patient, ordering and performing treatments and interventions, ordering and review of laboratory studies, ordering and review of radiographic studies, pulse oximetry, re-evaluation of patient's condition, obtaining history from patient or surrogate, review of old charts and development of treatment plan with patient or surrogate   I assumed direction of critical care for this patient from another provider in my specialty: no     (including critical care time)  Medications Ordered in ED Medications  nitroGLYCERIN (NITROGLYN) 2 % ointment 1 inch (has no administration in time range)  albuterol (VENTOLIN HFA) 108 (90 Base) MCG/ACT inhaler 1-2 puff (2 puffs Inhalation Given 02/19/19 2246)  furosemide (LASIX) injection 40 mg (40 mg Intravenous Given 02/19/19 2246)     Initial Impression / Assessment and Plan / ED Course  I have reviewed the triage vital signs and the nursing notes.  Pertinent labs & imaging results that were available during my care of the patient  were reviewed by me and considered in my medical decision making (see chart for details).  Clinical Course as of Feb 19 2347  Tue Feb 19, 2019  2101 Cardiomegaly.  No edema or consolidation.  DG Chest 2 View [CG]  2220 Sinus tachycardia Low voltage, precordial leads Borderline T abnormalities, lateral leads Borderline prolonged QT interval  ED EKG [CG]  2221 Normal AG   Glucose(!): 273 [CG]  2322 B Natriuretic Peptide(!): 654.7 [CG]  2334 Troponin I (High Sensitivity)(!): 64 [CG]    Clinical Course User Index [CG] Kinnie Feil, PA-C   EMR reviewed to obtain pertinent PMH.  Recent admission on 7/15 where she was noted to be hypervolemic but not felt to have decompensated heart failure she was seen was not diuresed and her Lasix was held.  Last echo shows severe systolic dysfunction with EF 20 to 25%.  ddx includes acute on chronic CHF. She is tachypnic, tachycardic, reports SOB, CP, orthopnea, fatigue, nausea, light-headedness.  Hypervolemic on exam here.  Concern for medical noncompliance with Lasix.    Lower on differential is infectious process as she has no fever, chills, cough however recent hospital admission puts her at risk for COVID-19.  She has underlying asthma which could be contributing.  Lower suspicion for ACS, PE.   ER work-up reviewed.  Chest x-ray without  abnormalities, pulmonary edema, infiltrates however BNP today 654.  This fits clinical picture, hypervolemia on exam, noncompliance with Lasix suggestive of decompensated heart failure.  She has been persistently tachycardic in the 120s and tachypneic.  No rectal fever, no other infectious symptoms. EKG is non ischemic. Although CP appears to be atypical, intermittent, sharp and well localized her trop is 64.  She has multiple cardiac risk factors for ACS.  Pt re-evaluated persistently tachycardic, tachypnic, reporting SOB. Still having CP, will repeat EKG given initial troponin and persistent pain.   Pt given  nitroglycerin paste and lasix here.   Will discuss with hospitalist for admission of likely decompensated HF, diuresis, serial troponins. COVID is negative. Lactic pending.  Final Clinical Impressions(s) / ED Diagnoses   Final diagnoses:  Orthopnea  Heart failure, systolic, with acute decompensation [facility])    ED Discharge Orders    None       Arlean Hopping 02/19/19 2349    Gareth Morgan, MD 02/21/19 [id number]

## 2019-02-20 ENCOUNTER — Other Ambulatory Visit: Payer: Self-pay

## 2019-02-20 DIAGNOSIS — E1169 Type 2 diabetes mellitus with other specified complication: Secondary | ICD-10-CM

## 2019-02-20 DIAGNOSIS — E1159 Type 2 diabetes mellitus with other circulatory complications: Secondary | ICD-10-CM

## 2019-02-20 DIAGNOSIS — E785 Hyperlipidemia, unspecified: Secondary | ICD-10-CM

## 2019-02-20 DIAGNOSIS — I10 Essential (primary) hypertension: Secondary | ICD-10-CM

## 2019-02-20 LAB — GLUCOSE, CAPILLARY
Glucose-Capillary: 298 mg/dL — ABNORMAL HIGH (ref 70–99)
Glucose-Capillary: 269 mg/dL — ABNORMAL HIGH (ref 70–99)
Glucose-Capillary: 181 mg/dL — ABNORMAL HIGH (ref 70–99)
Glucose-Capillary: 154 mg/dL — ABNORMAL HIGH (ref 70–99)

## 2019-02-20 LAB — CBC
HCT: 37.6 % (ref 36.0–46.0)
Hemoglobin: 11.2 g/dL — ABNORMAL LOW (ref 12.0–15.0)
MCH: 20.8 pg — ABNORMAL LOW (ref 26.0–34.0)
MCHC: 29.8 g/dL — ABNORMAL LOW (ref 30.0–36.0)
MCV: 69.9 fL — ABNORMAL LOW (ref 80.0–100.0)
Platelets: 301 10*3/uL (ref 150–400)
RBC: 5.38 MIL/uL — ABNORMAL HIGH (ref 3.87–5.11)
RDW: 19.2 % — ABNORMAL HIGH (ref 11.5–15.5)
WBC: 9.4 10*3/uL (ref 4.0–10.5)
nRBC: 0 % (ref 0.0–0.2)

## 2019-02-20 LAB — URINE CULTURE

## 2019-02-20 LAB — TROPONIN I (HIGH SENSITIVITY): Troponin I (High Sensitivity): 154 ng/L (ref <18)

## 2019-02-20 LAB — MAGNESIUM: Magnesium: 1.6 mg/dL — ABNORMAL LOW (ref 1.7–2.4)

## 2019-02-20 LAB — LACTIC ACID, PLASMA
Lactic Acid, Venous: 2.3 mmol/L (ref 0.5–1.9)
Lactic Acid, Venous: 3.1 mmol/L (ref 0.5–1.9)
Lactic Acid, Venous: 1.8 mmol/L (ref 0.5–1.9)

## 2019-02-20 LAB — PREGNANCY, URINE: Preg Test, Ur: NEGATIVE

## 2019-02-20 MED ORDER — FUROSEMIDE 10 MG/ML IJ SOLN
40.0000 mg | Freq: Two times a day (BID) | INTRAMUSCULAR | Status: DC
  Administered 2019-02-20: 40 mg via INTRAVENOUS
  Filled 2019-02-20: qty 4

## 2019-02-20 MED ORDER — AMLODIPINE BESYLATE 5 MG PO TABS
5.0000 mg | ORAL_TABLET | Freq: Every day | ORAL | Status: DC
  Administered 2019-02-20 – 2019-02-24 (×6): 5 mg via ORAL
  Filled 2019-02-20 (×6): qty 1

## 2019-02-20 MED ORDER — INSULIN GLARGINE 100 UNIT/ML ~~LOC~~ SOLN
20.0000 [IU] | Freq: Every day | SUBCUTANEOUS | Status: DC
  Administered 2019-02-21 – 2019-02-24 (×5): 20 [IU] via SUBCUTANEOUS
  Filled 2019-02-20 (×7): qty 0.2

## 2019-02-20 MED ORDER — CARVEDILOL 6.25 MG PO TABS
6.2500 mg | ORAL_TABLET | Freq: Two times a day (BID) | ORAL | Status: DC
  Administered 2019-02-20 – 2019-02-24 (×10): 6.25 mg via ORAL
  Filled 2019-02-20 (×10): qty 1

## 2019-02-20 MED ORDER — ALBUTEROL SULFATE HFA 108 (90 BASE) MCG/ACT IN AERS: 2.0000 | INHALATION_SPRAY | Freq: Four times a day (QID) | RESPIRATORY_TRACT | Status: DC | PRN

## 2019-02-20 MED ORDER — RIVAROXABAN 20 MG PO TABS
20.0000 mg | ORAL_TABLET | Freq: Every day | ORAL | Status: DC
  Administered 2019-02-20 – 2019-02-24 (×5): 20 mg via ORAL
  Filled 2019-02-20 (×5): qty 1

## 2019-02-20 MED ORDER — ACETAMINOPHEN 325 MG PO TABS
650.0000 mg | ORAL_TABLET | ORAL | Status: DC | PRN
  Administered 2019-02-20 – 2019-02-24 (×9): 650 mg via ORAL
  Filled 2019-02-20 (×10): qty 2

## 2019-02-20 MED ORDER — INSULIN ASPART 100 UNIT/ML ~~LOC~~ SOLN
0.0000 [IU] | Freq: Three times a day (TID) | SUBCUTANEOUS | Status: DC
  Administered 2019-02-20: 8 [IU] via SUBCUTANEOUS
  Administered 2019-02-20: 3 [IU] via SUBCUTANEOUS
  Administered 2019-02-20: 8 [IU] via SUBCUTANEOUS
  Administered 2019-02-21: 3 [IU] via SUBCUTANEOUS
  Administered 2019-02-21: 5 [IU] via SUBCUTANEOUS
  Administered 2019-02-21 – 2019-02-22 (×2): 2 [IU] via SUBCUTANEOUS
  Administered 2019-02-22 (×2): 3 [IU] via SUBCUTANEOUS
  Administered 2019-02-23: 2 [IU] via SUBCUTANEOUS
  Administered 2019-02-24: 3 [IU] via SUBCUTANEOUS
  Administered 2019-02-25: 09:00:00 2 [IU] via SUBCUTANEOUS
  Administered 2019-02-25: 3 [IU] via SUBCUTANEOUS
  Filled 2019-02-20: qty 0.15

## 2019-02-20 MED ORDER — HYDROXYZINE HCL 10 MG PO TABS
10.0000 mg | ORAL_TABLET | Freq: Three times a day (TID) | ORAL | Status: DC | PRN
  Administered 2019-02-20: 10 mg via ORAL
  Filled 2019-02-20: qty 1

## 2019-02-20 MED ORDER — SODIUM CHLORIDE 0.9 % IV BOLUS
250.0000 mL | Freq: Once | INTRAVENOUS | Status: AC
  Administered 2019-02-20: 250 mL via INTRAVENOUS

## 2019-02-20 MED ORDER — SODIUM CHLORIDE 0.9 % IV SOLN
250.0000 mL | INTRAVENOUS | Status: DC | PRN
  Administered 2019-02-24: 250 mL via INTRAVENOUS

## 2019-02-20 MED ORDER — ONDANSETRON HCL 4 MG/2ML IJ SOLN: 4.0000 mg | Freq: Four times a day (QID) | INTRAMUSCULAR | Status: DC | PRN

## 2019-02-20 MED ORDER — MAGNESIUM SULFATE 2 GM/50ML IV SOLN
2.0000 g | Freq: Once | INTRAVENOUS | Status: AC
  Administered 2019-02-20: 2 g via INTRAVENOUS
  Filled 2019-02-20: qty 50

## 2019-02-20 MED ORDER — HYDROCHLOROTHIAZIDE 25 MG PO TABS
25.0000 mg | ORAL_TABLET | Freq: Every day | ORAL | Status: DC
  Administered 2019-02-20 – 2019-02-25 (×6): 25 mg via ORAL
  Filled 2019-02-20 (×6): qty 1

## 2019-02-20 MED ORDER — SODIUM CHLORIDE 0.9% FLUSH
3.0000 mL | Freq: Two times a day (BID) | INTRAVENOUS | Status: DC
  Administered 2019-02-20 – 2019-02-25 (×11): 3 mL via INTRAVENOUS

## 2019-02-20 MED ORDER — FUROSEMIDE 10 MG/ML IJ SOLN
60.0000 mg | Freq: Two times a day (BID) | INTRAMUSCULAR | Status: DC
  Administered 2019-02-20 – 2019-02-25 (×10): 60 mg via INTRAVENOUS
  Filled 2019-02-20 (×10): qty 6

## 2019-02-20 MED ORDER — SODIUM CHLORIDE 0.9% FLUSH: 3.0000 mL | INTRAVENOUS | Status: DC | PRN

## 2019-02-20 MED ORDER — ALBUTEROL SULFATE (2.5 MG/3ML) 0.083% IN NEBU: 2.5000 mg | INHALATION_SOLUTION | Freq: Four times a day (QID) | RESPIRATORY_TRACT | Status: DC | PRN

## 2019-02-20 MED ORDER — HYDRALAZINE HCL 20 MG/ML IJ SOLN: 5.0000 mg | INTRAMUSCULAR | Status: DC | PRN

## 2019-02-20 MED ORDER — POTASSIUM CHLORIDE 20 MEQ/15ML (10%) PO SOLN
20.0000 meq | Freq: Every day | ORAL | Status: DC
  Administered 2019-02-20 – 2019-02-22 (×3): 20 meq via ORAL
  Filled 2019-02-20 (×3): qty 15

## 2019-02-20 NOTE — ED Notes (Signed)
Date and time results received: 02/20/19 12:11 AM (use smartphrase ".now" to insert current time)  Test: Lactic Acid Critical Value: 2.3  Name of Provider Notified:   Orders Received? Or Actions Taken?:

## 2019-02-20 NOTE — Progress Notes (Signed)
PROGRESS NOTE    [name]  GGE:[id number] DOB: 06-Mar-1976 DOA: 02/19/2019 PCP: [name], DO    Brief Narrative:  43 y.o. female with medical history significant for HFrEF (EF 20-25% by TTE 12/31/2018), paroxysmal atrial fibrillation on Xarelto, insulin-dependent type 2 diabetes, hypertension, hyperlipidemia, asthma, morbid obesity, and OSA who presents to the ED for evaluation of dyspnea.   Patient was recently admitted at [facility] from 02/06/2019-02/09/2019 for DKA.  Her home Lasix, Entresto, and HCTZ were held during hospital stay.  She was discharged on Lantus 20 units daily.  Patient states that when she left the hospital she was feeling better but not quite to her usual baseline.  She says she has had progressive generalized weakness and then yesterday developed new onset of shortness of breath.  She has had associated orthopnea and paroxysmal nocturnal dyspnea.  She has noticed increased swelling in both of her legs.  She reports intermittent central chest discomfort without radiation to her neck, jaw, or arms.  She reports associated fatigue.  She has felt intermittent palpitations.  She otherwise denies any subjective fevers, diaphoresis, abdominal pain, dysuria, diarrhea, constipation.  She states that she has been taking her medications as prescribed.  ED Course:  Initial vitals showed BP 144/113, pulse 125, RR 19, temp 97.6 Fahrenheit, SPO2 99% on room air.  Labs notable for sodium 134, potassium 4.6, bicarb 21, BUN 21, creatinine 1.00, serum glucose 273, BNP 654.7, high-sensitivity troponin 64.  Urinalysis showed negative nitrites, negative leukocytes, negative ketones, >300 protein, rare bacteria on microscopy.  SARS-CoV-2 test was negative.  2 view chest x-ray showed cardiomegaly without focal consolidation, effusion, or significant pulmonary edema.  Patient was given IV Lasix 40 mg once and albuterol inhaler treatment.  The hospitalist service was  consulted to admit for further evaluation   Assessment & Plan:   Principal Problem:   Acute on chronic HFrEF (heart failure with reduced ejection fraction) ([place]) Active Problems:   Type 2 diabetes mellitus ([place])   Hypertension associated with diabetes ([place])   Obstructive sleep apnea   Hyperlipidemia associated with type 2 diabetes mellitus ([place])   Paroxysmal atrial fibrillation ([place])   Asthma  Acute on chronic heart failure with reduced ejection fraction: -EF 20-25% by TTE 12/31/2018. Received IV Lasix 40 mg in the ED with good urine output so far (not recorded).  Previously on Entresto, which appears to have been discontinued by her primary care as she is reportedly trying to conceive.  Will check urine pregnancy test. -Continue IV Lasix 40 mg twice daily, will increase to 60mg  -Continue carvedilol 6.25 mg p.o. twice daily -Monitor daily weights and strict I/O's -Supplement potassium, monitor electrolytes and renal function  Paroxysmal atrial fibrillation: CHA2DS2-VASc Score is at least 4.  Rhythm is sinus tachycardia on admission. -Continue home carvedilol -Continue Xarelto as tolerated  Insulin-dependent type 2 diabetes with hyperglycemia: -A1c >15.5 on 02/06/2019.   -Previously documented nonadherence to home medications. -Continue on Lantus 20 units plus SSI  Hypertension: Hypertensive on admission. -Continue home carvedilol, amlodipine, and Lasix diuresis as tolerated  Hyperlipidemia: -Home statin recently discontinued by her primary care physician reportedly try to conceive.  Asthma: -No active wheezing on admission.   -continue with albuterol as needed.  OSA: -Patient states she is not using her CPAP due to claustrophobia.   -She has tried nasal route and reports intolerance.  She is not willing to try CPAP here.   -Use supplemental O2 via [place] nightly as tolerated  DVT prophylaxis:  Xarelto Code Status: Full Family Communication: Pt in room, family not at  bedside Disposition Plan: Uncertain at this time  Consultants:     Procedures:     Antimicrobials: Anti-infectives (From admission, onward)   None       Subjective: Still feeling subjectively sob  Objective: Vitals:   02/20/19 0058 02/20/19 0531 02/20/19 0647 02/20/19 1409  BP: (!) 163/123  (!) 152/119 (!) 122/97  Pulse: (!) 126  98 91  Resp: 15  18 20   Temp: 97.6 F (36.4 C)  97.9 F (36.6 C) (!) 97.3 F (36.3 C)  TempSrc: Oral  Oral Oral  SpO2: 100%  100% 98%  Weight: 102.3 kg 102.9 kg    Height: 4\' 9"  (1.448 m)       Intake/Output Summary (Last 24 hours) at 02/20/2019 1839 Last data filed at 02/20/2019 1743 Gross per 24 hour  Intake 210 ml  Output 2400 ml  Net -2190 ml   Filed Weights   02/20/19 0058 02/20/19 0531  Weight: 102.3 kg 102.9 kg    Examination:  General exam: Appears uncomfortable laying in bed Respiratory system: Clear to auscultation. Respiratory effort normal. Cardiovascular system: S1 & S2 heard, RRR Gastrointestinal system: Abdomen is nondistended, soft and nontender. No organomegaly or masses felt. Normal bowel sounds heard. Central nervous system: Alert and oriented. No focal neurological deficits. Extremities: Symmetric 5 x 5 power, BLE edema Skin: No rashes, lesions  Psychiatry: Judgement and insight appear normal. Mood & affect appropriate.   Data Reviewed: I have personally reviewed following labs and imaging studies  CBC: Recent Labs  Lab 02/20/19 0501  WBC 9.4  HGB 11.2*  HCT 37.6  MCV 69.9*  PLT 301   Basic Metabolic Panel: Recent Labs  Lab 02/19/19 2026 02/20/19 0501  NA 134*  --   K 4.6  --   CL 102  --   CO2 21*  --   GLUCOSE 273*  --   BUN 21*  --   CREATININE 1.00  --   CALCIUM 8.5*  --   MG  --  1.6*   GFR: Estimated Creatinine Clearance: 75.2 mL/min (by C-G formula based on SCr of 1 mg/dL). Liver Function Tests: Recent Labs  Lab 02/19/19 2026  AST 39  ALT 26  ALKPHOS 162*  BILITOT 2.0*   PROT 7.2  ALBUMIN 2.5*   No results for input(s): LIPASE, AMYLASE in the last 168 hours. No results for input(s): AMMONIA in the last 168 hours. Coagulation Profile: No results for input(s): INR, PROTIME in the last 168 hours. Cardiac Enzymes: No results for input(s): CKTOTAL, CKMB, CKMBINDEX, TROPONINI in the last 168 hours. BNP (last 3 results) No results for input(s): PROBNP in the last 8760 hours. HbA1C: No results for input(s): HGBA1C in the last 72 hours. CBG: Recent Labs  Lab 02/19/19 1558 02/20/19 0728 02/20/19 1156 02/20/19 1607  GLUCAP 285* 298* 269* 181*   Lipid Profile: No results for input(s): CHOL, HDL, LDLCALC, TRIG, CHOLHDL, LDLDIRECT in the last 72 hours. Thyroid Function Tests: No results for input(s): TSH, T4TOTAL, FREET4, T3FREE, THYROIDAB in the last 72 hours. Anemia Panel: No results for input(s): VITAMINB12, FOLATE, FERRITIN, TIBC, IRON, RETICCTPCT in the last 72 hours. Sepsis Labs: Recent Labs  Lab 02/19/19 2258 02/20/19 0501 02/20/19 0824  LATICACIDVEN 2.3* 3.1* 1.8    Recent Results (from the past 240 hour(s))  SARS Coronavirus 2 (CEPHEID- Performed in [place] hospital lab), Hosp Order     Status: None  Collection Time: 02/19/19  8:59 PM   Specimen: Nasopharyngeal Swab  Result Value Ref Range Status   SARS Coronavirus 2 NEGATIVE NEGATIVE Final    Comment: (NOTE) If result is NEGATIVE SARS-CoV-2 target nucleic acids are NOT DETECTED. The SARS-CoV-2 RNA is generally detectable in upper and lower  respiratory specimens during the acute phase of infection. The lowest  concentration of SARS-CoV-2 viral copies this assay can detect is 250  copies / mL. A negative result does not preclude SARS-CoV-2 infection  and should not be used as the sole basis for treatment or other  patient management decisions.  A negative result may occur with  improper specimen collection / handling, submission of specimen other  than nasopharyngeal swab,  presence of viral mutation(s) within the  areas targeted by this assay, and inadequate number of viral copies  (<250 copies / mL). A negative result must be combined with clinical  observations, patient history, and epidemiological information. If result is POSITIVE SARS-CoV-2 target nucleic acids are DETECTED. The SARS-CoV-2 RNA is generally detectable in upper and lower  respiratory specimens dur ing the acute phase of infection.  Positive  results are indicative of active infection with SARS-CoV-2.  Clinical  correlation with patient history and other diagnostic information is  necessary to determine patient infection status.  Positive results do  not rule out bacterial infection or co-infection with other viruses. If result is PRESUMPTIVE POSTIVE SARS-CoV-2 nucleic acids MAY BE PRESENT.   A presumptive positive result was obtained on the submitted specimen  and confirmed on repeat testing.  While 2019 novel coronavirus  (SARS-CoV-2) nucleic acids may be present in the submitted sample  additional confirmatory testing may be necessary for epidemiological  and / or clinical management purposes  to differentiate between  SARS-CoV-2 and other Sarbecovirus currently known to infect humans.  If clinically indicated additional testing with an alternate test  methodology [phone]) is advised. The SARS-CoV-2 RNA is generally  detectable in upper and lower respiratory sp ecimens during the acute  phase of infection. The expected result is Negative. Fact Sheet for Patients:  StrictlyIdeas.no Fact Sheet for Healthcare Providers: BankingDealers.co.za This test is not yet approved or cleared by the [place] FDA and has been authorized for detection and/or diagnosis of SARS-CoV-2 by FDA under an Emergency Use Authorization (EUA).  This EUA will remain in effect (meaning this test can be used) for the duration of the COVID-19 declaration under  Section 564(b)(1) of the Act, 21 U.S.C. section 360bbb-3(b)(1), unless the authorization is terminated or revoked sooner. Performed at [facility], [place] [address], [place], [place] [address]      Radiology Studies: Dg Chest 2 View  Result Date: 02/19/2019 CLINICAL DATA:  Shortness of breath EXAM: CHEST - 2 VIEW COMPARISON:  February 06, 2019 FINDINGS: Lungs are clear. Heart is enlarged with pulmonary vascularity normal. No adenopathy. No bone lesions. IMPRESSION: Cardiomegaly.  No edema or consolidation. Electronically Signed   By: Lowella Grip III M.D.   On: 02/19/2019 17:12    Scheduled Meds: . amLODipine  5 mg Oral QHS  . carvedilol  6.25 mg Oral BID  . furosemide  60 mg Intravenous Q12H  . hydrochlorothiazide  25 mg Oral Daily  . insulin aspart  0-15 Units Subcutaneous TID WC  . insulin glargine  20 Units Subcutaneous QHS  . potassium chloride  20 mEq Oral Daily  . rivaroxaban  20 mg Oral Q supper  . sodium chloride flush  3 mL  Intravenous Q12H   Continuous Infusions: . sodium chloride       LOS: 1 day   Marylu Lund, MD [facility] Pager On Amion  If 7PM-7AM, please contact night-coverage 02/20/2019, 6:39 PM

## 2019-02-20 NOTE — Progress Notes (Signed)
CRITICAL VALUE ALERT  Critical Value:  Lactic Acid 3.1  Date & Time Notied:  02/20/2019 0605  Provider Notified: Lamar Blinks, NP  Orders Received/Actions taken:

## 2019-02-20 NOTE — Plan of Care (Signed)
  Problem: Education: Goal: Knowledge of General Education information will improve Description: Including pain rating scale, medication(s)/side effects and non-pharmacologic comfort measures Outcome: Progressing   Problem: Clinical Measurements: Goal: Ability to maintain clinical measurements within normal limits will improve Outcome: Progressing Goal: Will remain free from infection Outcome: Progressing   

## 2019-02-20 NOTE — ED Notes (Signed)
ED TO INPATIENT HANDOFF REPORT  Name/Age/Gender [name] 43 y.o. female  Code Status    Code Status Orders  (From admission, onward)         Start     Ordered   02/20/19 0027  Full code  Continuous     02/20/19 0029        Code Status History    Date Active Date Inactive Code Status Order ID Comments User Context   02/06/2019 2114 02/09/2019 1359 Full Code [id number]  Richarda Osmond, DO Inpatient   07/22/2013 1240 07/24/2013 1702 Full Code [id number]  Gerda Diss, DO Inpatient   Advance Care Planning Activity      Home/SNF/Other Home  Chief Complaint SOB  Level of Care/Admitting Diagnosis ED Disposition    ED Disposition Condition [facility] Area: [facility] [[id number]]  Level of Care: Telemetry [5]  Admit to tele based on following criteria: Acute CHF  Covid Evaluation: Confirmed COVID Negative  Diagnosis: Acute on chronic HFrEF (heart failure with reduced ejection fraction) [facility]) [[id number]]  Admitting Physician: Lenore Cordia [[id number]]  Attending Physician: Lenore Cordia [[id number]]  Estimated length of stay: past midnight tomorrow  Certification:: I certify this patient will need inpatient services for at least 2 midnights  PT Class (Do Not Modify): Inpatient [101]  PT Acc Code (Do Not Modify): Private [1]       Medical History Past Medical History:  Diagnosis Date  . Abnormal Pap smear   . Anemia   . Asthma   . Diabetes mellitus   . Hypertension   . 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    . Migraines   . Morbid obesity ([place]) 02/06/2019  . Obstructive sleep apnea 06/30/2015  . Polycystic ovarian disease     Allergies Allergies  Allergen Reactions  . Shellfish Allergy Swelling    Airway involvement including EMS - Has Epi-Pen  . Metformin And Related Diarrhea    IV  Location/Drains/Wounds Patient Lines/Drains/Airways Status   Active Line/Drains/Airways    Name:   Placement date:   Placement time:   Site:   Days:   Peripheral IV 02/19/19 Left Antecubital   02/19/19    2024    Antecubital   1          Labs/Imaging Results for orders placed or performed during the hospital encounter of 02/19/19 (from the past 48 hour(s))  CBG monitoring, ED     Status: Abnormal   Collection Time: 02/19/19  3:58 PM  Result Value Ref Range   Glucose-Capillary 285 (H) 70 - 99 mg/dL  Brain natriuretic peptide     Status: Abnormal   Collection Time: 02/19/19  8:26 PM  Result Value Ref Range   B Natriuretic Peptide 654.7 (H) 0.0 - 100.0 pg/mL    Comment: Performed at [facility], [place] [address] [place], [place] [address]  Comprehensive metabolic panel     Status: Abnormal   Collection Time: 02/19/19  8:26 PM  Result Value Ref Range   Sodium 134 (L) 135 - 145 mmol/L   Potassium 4.6 3.5 - 5.1 mmol/L   Chloride 102 98 - 111 mmol/L   CO2 21 (L) 22 - 32 mmol/L   Glucose, Bld 273 (H) 70 - 99 mg/dL   BUN 21 (H) 6 - 20 mg/dL   Creatinine,  Ser 1.00 0.44 - 1.00 mg/dL   Calcium 8.5 (L) 8.9 - 10.3 mg/dL   Total Protein 7.2 6.5 - 8.1 g/dL   Albumin 2.5 (L) 3.5 - 5.0 g/dL   AST 39 15 - 41 U/L   ALT 26 0 - 44 U/L   Alkaline Phosphatase 162 (H) 38 - 126 U/L   Total Bilirubin 2.0 (H) 0.3 - 1.2 mg/dL   GFR calc non Af Amer >60 >60 mL/min   GFR calc Af Amer >60 >60 mL/min   Anion gap 11 5 - 15    Comment: Performed at [facility], [place] [address] [place], [place] [address]  Urinalysis, Routine w reflex microscopic     Status: Abnormal   Collection Time: 02/19/19  8:58 PM  Result Value Ref Range   Color, Urine AMBER (A) YELLOW    Comment: BIOCHEMICALS MAY BE AFFECTED BY COLOR   APPearance HAZY (A) CLEAR   Specific Gravity, Urine 1.038 (H) 1.005 - 1.030   pH 5.0 5.0 - 8.0   Glucose, UA 150 (A) NEGATIVE mg/dL   Hgb urine dipstick  NEGATIVE NEGATIVE   Bilirubin Urine SMALL (A) NEGATIVE   Ketones, ur NEGATIVE NEGATIVE mg/dL   Protein, ur >=300 (A) NEGATIVE mg/dL   Nitrite NEGATIVE NEGATIVE   Leukocytes,Ua NEGATIVE NEGATIVE   RBC / HPF 0-5 0 - 5 RBC/hpf   WBC, UA 0-5 0 - 5 WBC/hpf   Bacteria, UA RARE (A) NONE SEEN   Squamous Epithelial / LPF 0-5 0 - 5   Mucus PRESENT    Hyaline Casts, UA PRESENT     Comment: Performed at [facility], [place] [address], [place],  [address]  SARS Coronavirus 2 (CEPHEID- Performed in [place] hospital lab), Hosp Order     Status: None   Collection Time: 02/19/19  8:59 PM   Specimen: Nasopharyngeal Swab  Result Value Ref Range   SARS Coronavirus 2 NEGATIVE NEGATIVE    Comment: (NOTE) If result is NEGATIVE SARS-CoV-2 target nucleic acids are NOT DETECTED. The SARS-CoV-2 RNA is generally detectable in upper and lower  respiratory specimens during the acute phase of infection. The lowest  concentration of SARS-CoV-2 viral copies this assay can detect is 250  copies / mL. A negative result does not preclude SARS-CoV-2 infection  and should not be used as the sole basis for treatment or other  patient management decisions.  A negative result may occur with  improper specimen collection / handling, submission of specimen other  than nasopharyngeal swab, presence of viral mutation(s) within the  areas targeted by this assay, and inadequate number of viral copies  (<250 copies / mL). A negative result must be combined with clinical  observations, patient history, and epidemiological information. If result is POSITIVE SARS-CoV-2 target nucleic acids are DETECTED. The SARS-CoV-2 RNA is generally detectable in upper and lower  respiratory specimens dur ing the acute phase of infection.  Positive  results are indicative of active infection with SARS-CoV-2.  Clinical  correlation with patient history and other diagnostic information is  necessary to determine  patient infection status.  Positive results do  not rule out bacterial infection or co-infection with other viruses. If result is PRESUMPTIVE POSTIVE SARS-CoV-2 nucleic acids MAY BE PRESENT.   A presumptive positive result was obtained on the submitted specimen  and confirmed on repeat testing.  While 2019 novel coronavirus  (SARS-CoV-2) nucleic acids may be present in the submitted sample  additional confirmatory testing may  be necessary for epidemiological  and / or clinical management purposes  to differentiate between  SARS-CoV-2 and other Sarbecovirus currently known to infect humans.  If clinically indicated additional testing with an alternate test  methodology [phone]) is advised. The SARS-CoV-2 RNA is generally  detectable in upper and lower respiratory sp ecimens during the acute  phase of infection. The expected result is Negative. Fact Sheet for Patients:  StrictlyIdeas.no Fact Sheet for Healthcare Providers: BankingDealers.co.za This test is not yet approved or cleared by the [place] FDA and has been authorized for detection and/or diagnosis of SARS-CoV-2 by FDA under an Emergency Use Authorization (EUA).  This EUA will remain in effect (meaning this test can be used) for the duration of the COVID-19 declaration under Section 564(b)(1) of the Act, 21 U.S.C. section 360bbb-3(b)(1), unless the authorization is terminated or revoked sooner. Performed at [facility], [place] [address] [place], [place] [address]   Troponin I (High Sensitivity)     Status: Abnormal   Collection Time: 02/19/19 10:29 PM  Result Value Ref Range   Troponin I (High Sensitivity) 64 (H) <18 ng/L    Comment: (NOTE) Elevated high sensitivity troponin I (hsTnI) values and significant  changes across serial measurements may suggest ACS but many other  chronic and acute conditions are known to elevate hsTnI results.  Refer to the  "Links" section for chest pain algorithms and additional  guidance. Performed at [facility], [place] [address], [place], [place] [address]   Lactic acid, plasma     Status: Abnormal   Collection Time: 02/19/19 10:58 PM  Result Value Ref Range   Lactic Acid, Venous 2.3 (HH) 0.5 - 1.9 mmol/L    Comment: CRITICAL RESULT CALLED TO, READ BACK BY AND VERIFIED WITH: RN H LACIVITA AT 0010 02/20/19 CRUICKSHANK A Performed at [place] [address] [place], [place] [address]    Dg Chest 2 View  Result Date: 02/19/2019 CLINICAL DATA:  Shortness of breath EXAM: CHEST - 2 VIEW COMPARISON:  February 06, 2019 FINDINGS: Lungs are clear. Heart is enlarged with pulmonary vascularity normal. No adenopathy. No bone lesions. IMPRESSION: Cardiomegaly.  No edema or consolidation. Electronically Signed   By: Lowella Grip III M.D.   On: 02/19/2019 17:12    Pending Labs Unresulted Labs (From admission, onward)    Start     Ordered   02/21/19 9371  Basic metabolic panel  Daily,   R     02/20/19 0029   02/20/19 0500  Magnesium  Tomorrow morning,   R     02/20/19 0029   02/20/19 0500  CBC  Tomorrow morning,   R     02/20/19 0029   02/20/19 0035  Pregnancy, urine  Once,   STAT     02/20/19 0034   02/19/19 2349  CBC  Add-on,   AD     02/19/19 2348   02/19/19 2058  Urine culture  ONCE - STAT,   STAT     02/19/19 2058   02/19/19 2058  Lactic acid, plasma  Now then every 2 hours,   STAT     02/19/19 2058          Vitals/Pain Today's Vitals   02/19/19 2200 02/19/19 2246 02/19/19 2330 02/20/19 0030  BP: (!) 161/129  (!) 141/116 (!) 134/108  Pulse: (!) 121  (!) 113 (!) 115  Resp: (!) 22  (!) 22 20  Temp:      TempSrc:  SpO2: 98% 98% 96% 98%  PainSc:        Isolation Precautions No active isolations  Medications Medications  sodium chloride flush (NS) 0.9 % injection 3 mL (has no administration in time range)  sodium chloride flush (NS) 0.9 %  injection 3 mL (has no administration in time range)  0.9 %  sodium chloride infusion (has no administration in time range)  acetaminophen (TYLENOL) tablet 650 mg (has no administration in time range)  ondansetron (ZOFRAN) injection 4 mg (has no administration in time range)  furosemide (LASIX) injection 40 mg (has no administration in time range)  potassium chloride 20 MEQ/15ML (10%) solution 20 mEq (has no administration in time range)  amLODipine (NORVASC) tablet 5 mg (has no administration in time range)  carvedilol (COREG) tablet 6.25 mg (has no administration in time range)  hydrochlorothiazide (HYDRODIURIL) tablet 25 mg (has no administration in time range)  hydrOXYzine (ATARAX/VISTARIL) tablet 10 mg (has no administration in time range)  rivaroxaban (XARELTO) tablet 20 mg (has no administration in time range)  albuterol (PROVENTIL) (2.5 MG/3ML) 0.083% nebulizer solution 2.5 mg (has no administration in time range)  insulin glargine (LANTUS) injection 20 Units (has no administration in time range)  insulin aspart (novoLOG) injection 0-15 Units (has no administration in time range)  albuterol (VENTOLIN HFA) 108 (90 Base) MCG/ACT inhaler 1-2 puff (2 puffs Inhalation Given 02/19/19 2246)  furosemide (LASIX) injection 40 mg (40 mg Intravenous Given 02/19/19 2246)  nitroGLYCERIN (NITROGLYN) 2 % ointment 1 inch (1 inch Topical Given 02/20/19 0010)    Mobility walks

## 2019-02-20 NOTE — Progress Notes (Signed)
CRITICAL VALUE ALERT  Critical Value:  Troponin (High Sensitivity): 154   Date & Time Notied:  02/20/2019 0608  Provider Notified: Lamar Blinks, NP  Orders Received/Actions taken:

## 2019-02-21 LAB — BASIC METABOLIC PANEL
Anion gap: 10 (ref 5–15)
BUN: 20 mg/dL (ref 6–20)
CO2: 23 mmol/L (ref 22–32)
Calcium: 8.5 mg/dL — ABNORMAL LOW (ref 8.9–10.3)
Chloride: 100 mmol/L (ref 98–111)
Creatinine, Ser: 1.01 mg/dL — ABNORMAL HIGH (ref 0.44–1.00)
GFR calc Af Amer: >60 mL/min (ref >60)
GFR calc non Af Amer: >60 mL/min (ref >60)
Glucose, Bld: 148 mg/dL — ABNORMAL HIGH (ref 70–99)
Potassium: 4.3 mmol/L (ref 3.5–5.1)
Sodium: 133 mmol/L — ABNORMAL LOW (ref 135–145)

## 2019-02-21 LAB — GLUCOSE, CAPILLARY
Glucose-Capillary: 172 mg/dL — ABNORMAL HIGH (ref 70–99)
Glucose-Capillary: 180 mg/dL — ABNORMAL HIGH (ref 70–99)
Glucose-Capillary: 201 mg/dL — ABNORMAL HIGH (ref 70–99)
Glucose-Capillary: 121 mg/dL — ABNORMAL HIGH (ref 70–99)
Glucose-Capillary: 198 mg/dL — ABNORMAL HIGH (ref 70–99)

## 2019-02-21 MED ORDER — ZOLPIDEM TARTRATE 5 MG PO TABS
5.0000 mg | ORAL_TABLET | Freq: Once | ORAL | Status: AC
  Administered 2019-02-21: 5 mg via ORAL
  Filled 2019-02-21: qty 1

## 2019-02-21 NOTE — Progress Notes (Signed)
PROGRESS NOTE    IMO CUMBIE  WOE:[id number] DOB: [birth year] DOA: 02/19/2019 PCP: [name], DO    Brief Narrative:  43 y.o. female with medical history significant for HFrEF (EF 20-25% by TTE 12/31/2018), paroxysmal atrial fibrillation on Xarelto, insulin-dependent type 2 diabetes, hypertension, hyperlipidemia, asthma, morbid obesity, and OSA who presents to the ED for evaluation of dyspnea.   Patient was recently admitted at [facility] from 02/06/2019-02/09/2019 for DKA.  Her home Lasix, Entresto, and HCTZ were held during hospital stay.  She was discharged on Lantus 20 units daily.  Patient states that when she left the hospital she was feeling better but not quite to her usual baseline.  She says she has had progressive generalized weakness and then yesterday developed new onset of shortness of breath.  She has had associated orthopnea and paroxysmal nocturnal dyspnea.  She has noticed increased swelling in both of her legs.  She reports intermittent central chest discomfort without radiation to her neck, jaw, or arms.  She reports associated fatigue.  She has felt intermittent palpitations.  She otherwise denies any subjective fevers, diaphoresis, abdominal pain, dysuria, diarrhea, constipation.  She states that she has been taking her medications as prescribed.  ED Course:  Initial vitals showed BP 144/113, pulse 125, RR 19, temp 97.6 Fahrenheit, SPO2 99% on room air.  Labs notable for sodium 134, potassium 4.6, bicarb 21, BUN 21, creatinine 1.00, serum glucose 273, BNP 654.7, high-sensitivity troponin 64.  Urinalysis showed negative nitrites, negative leukocytes, negative ketones, >300 protein, rare bacteria on microscopy.  SARS-CoV-2 test was negative.  2 view chest x-ray showed cardiomegaly without focal consolidation, effusion, or significant pulmonary edema.  Patient was given IV Lasix 40 mg once and albuterol inhaler treatment.  The hospitalist service was  consulted to admit for further evaluation   Assessment & Plan:   Principal Problem:   Acute on chronic HFrEF (heart failure with reduced ejection fraction) ([place]) Active Problems:   Type 2 diabetes mellitus ([place])   Hypertension associated with diabetes ([place])   Obstructive sleep apnea   Hyperlipidemia associated with type 2 diabetes mellitus ([place])   Paroxysmal atrial fibrillation ([place])   Asthma  Acute on chronic heart failure with reduced ejection fraction: -EF 20-25% by TTE 12/31/2018. Received IV Lasix 40 mg in the ED with good urine output so far (not recorded).  Previously on Entresto, which appears to have been discontinued by her primary care as she is reportedly trying to conceive.  Will check urine pregnancy test. -Continue IV Lasix 60 mg twice daily -Continue carvedilol 6.25 mg p.o. twice daily -Monitor daily weights and strict I/O's -Over 4L urine output over past 24hrs, with decrease of 4lbs overnight -Still clinically volume overloaded. Will continue IV lasix  Paroxysmal atrial fibrillation: CHA2DS2-VASc Score is at least 4.  Rhythm is sinus tachycardia on admission. -Continue home carvedilol -Continue Xarelto as pt tolerates  Insulin-dependent type 2 diabetes with hyperglycemia: -A1c >15.5 on 02/06/2019.   -Previously documented nonadherence to home medications. -Continue on Lantus 20 units plus SSI  Hypertension: -Hypertensive at time of admission. -Continue home carvedilol, amlodipine, and Lasix diuresis as tolerated -BP currently stable and controlled  Hyperlipidemia: -Home statin recently discontinued by her primary care physician reportedly try to conceive. -Stable  Asthma: -No active wheezing on admission.   -continue with albuterol as needed -No wheezing at this time.  OSA: -Patient states she is not using her CPAP due to claustrophobia.   -She has tried nasal route and reports  intolerance.  She is not willing to try CPAP here. -Continue with  supplemental O2 via [place] nightly as tolerated  DVT prophylaxis: Xarelto Code Status: Full Family Communication: Pt in room, family not at bedside Disposition Plan: Uncertain at this time  Consultants:     Procedures:     Antimicrobials: Anti-infectives (From admission, onward)   None      Subjective: Without complaints at this time  Objective: Vitals:   02/20/19 2231 02/21/19 0500 02/21/19 0537 02/21/19 1442  BP: (!) 139/109  (!) 116/105 115/88  Pulse: 96  96 94  Resp: 18  18 20   Temp: 97.7 F (36.5 C)  (!) 97.4 F (36.3 C) (!) 97.5 F (36.4 C)  TempSrc: Oral  Oral Oral  SpO2: 100%  100% 97%  Weight:  100.4 kg    Height:        Intake/Output Summary (Last 24 hours) at 02/21/2019 1713 Last data filed at 02/21/2019 1700 Gross per 24 hour  Intake -  Output 2950 ml  Net -2950 ml   Filed Weights   02/20/19 0058 02/20/19 0531 02/21/19 0500  Weight: 102.3 kg 102.9 kg 100.4 kg    Examination: General exam: Awake, laying in bed, in nad Respiratory system: Normal respiratory effort, no wheezing Cardiovascular system: regular rate, s1, s2 Gastrointestinal system: Soft, nondistended, positive BS Central nervous system: CN2-12 grossly intact, strength intact Extremities: Perfused, no clubbing, BLE edema, generally edematous Skin: Normal skin turgor, no notable skin lesions seen Psychiatry: Mood normal // no visual hallucinations   Data Reviewed: I have personally reviewed following labs and imaging studies  CBC: Recent Labs  Lab 02/20/19 0501  WBC 9.4  HGB 11.2*  HCT 37.6  MCV 69.9*  PLT 229   Basic Metabolic Panel: Recent Labs  Lab 02/19/19 2026 02/20/19 0501 02/21/19 0500  NA 134*  --  133*  K 4.6  --  4.3  CL 102  --  100  CO2 21*  --  23  GLUCOSE 273*  --  148*  BUN 21*  --  20  CREATININE 1.00  --  1.01*  CALCIUM 8.5*  --  8.5*  MG  --  1.6*  --    GFR: Estimated Creatinine Clearance: 73.2 mL/min (A) (by C-G formula based on SCr of 1.01  mg/dL (H)). Liver Function Tests: Recent Labs  Lab 02/19/19 2026  AST 39  ALT 26  ALKPHOS 162*  BILITOT 2.0*  PROT 7.2  ALBUMIN 2.5*   No results for input(s): LIPASE, AMYLASE in the last 168 hours. No results for input(s): AMMONIA in the last 168 hours. Coagulation Profile: No results for input(s): INR, PROTIME in the last 168 hours. Cardiac Enzymes: No results for input(s): CKTOTAL, CKMB, CKMBINDEX, TROPONINI in the last 168 hours. BNP (last 3 results) No results for input(s): PROBNP in the last 8760 hours. HbA1C: No results for input(s): HGBA1C in the last 72 hours. CBG: Recent Labs  Lab 02/20/19 2229 02/21/19 0116 02/21/19 0748 02/21/19 1150 02/21/19 1658  GLUCAP 154* 172* 180* 201* 121*   Lipid Profile: No results for input(s): CHOL, HDL, LDLCALC, TRIG, CHOLHDL, LDLDIRECT in the last 72 hours. Thyroid Function Tests: No results for input(s): TSH, T4TOTAL, FREET4, T3FREE, THYROIDAB in the last 72 hours. Anemia Panel: No results for input(s): VITAMINB12, FOLATE, FERRITIN, TIBC, IRON, RETICCTPCT in the last 72 hours. Sepsis Labs: Recent Labs  Lab 02/19/19 2258 02/20/19 0501 02/20/19 0824  LATICACIDVEN 2.3* 3.1* 1.8    Recent Results (from the  past 240 hour(s))  Urine culture     Status: Abnormal   Collection Time: 02/19/19  8:58 PM   Specimen: Nasopharyngeal Swab; Urine  Result Value Ref Range Status   Specimen Description   Final    Urine Performed at [place] [address], [place], [place] [address]    Special Requests   Final    NONE Performed at [facility], [place] [address] [place], [place] [address]    Culture MULTIPLE SPECIES PRESENT, SUGGEST RECOLLECTION (A)  Final   Report Status 02/20/2019 FINAL  Final  SARS Coronavirus 2 (CEPHEID- Performed in [place] hospital lab), Hosp Order     Status: None   Collection Time: 02/19/19  8:59 PM   Specimen: Nasopharyngeal Swab  Result Value Ref Range Status    SARS Coronavirus 2 NEGATIVE NEGATIVE Final    Comment: (NOTE) If result is NEGATIVE SARS-CoV-2 target nucleic acids are NOT DETECTED. The SARS-CoV-2 RNA is generally detectable in upper and lower  respiratory specimens during the acute phase of infection. The lowest  concentration of SARS-CoV-2 viral copies this assay can detect is 250  copies / mL. A negative result does not preclude SARS-CoV-2 infection  and should not be used as the sole basis for treatment or other  patient management decisions.  A negative result may occur with  improper specimen collection / handling, submission of specimen other  than nasopharyngeal swab, presence of viral mutation(s) within the  areas targeted by this assay, and inadequate number of viral copies  (<250 copies / mL). A negative result must be combined with clinical  observations, patient history, and epidemiological information. If result is POSITIVE SARS-CoV-2 target nucleic acids are DETECTED. The SARS-CoV-2 RNA is generally detectable in upper and lower  respiratory specimens dur ing the acute phase of infection.  Positive  results are indicative of active infection with SARS-CoV-2.  Clinical  correlation with patient history and other diagnostic information is  necessary to determine patient infection status.  Positive results do  not rule out bacterial infection or co-infection with other viruses. If result is PRESUMPTIVE POSTIVE SARS-CoV-2 nucleic acids MAY BE PRESENT.   A presumptive positive result was obtained on the submitted specimen  and confirmed on repeat testing.  While 2019 novel coronavirus  (SARS-CoV-2) nucleic acids may be present in the submitted sample  additional confirmatory testing may be necessary for epidemiological  and / or clinical management purposes  to differentiate between  SARS-CoV-2 and other Sarbecovirus currently known to infect humans.  If clinically indicated additional testing with an alternate test   methodology [phone]) is advised. The SARS-CoV-2 RNA is generally  detectable in upper and lower respiratory sp ecimens during the acute  phase of infection. The expected result is Negative. Fact Sheet for Patients:  StrictlyIdeas.no Fact Sheet for Healthcare Providers: BankingDealers.co.za This test is not yet approved or cleared by the [place] FDA and has been authorized for detection and/or diagnosis of SARS-CoV-2 by FDA under an Emergency Use Authorization (EUA).  This EUA will remain in effect (meaning this test can be used) for the duration of the COVID-19 declaration under Section 564(b)(1) of the Act, 21 U.S.C. section 360bbb-3(b)(1), unless the authorization is terminated or revoked sooner. Performed at [facility], [place] [address] [place], [place] [address]      Radiology Studies: No results found.  Scheduled Meds: . amLODipine  5 mg Oral QHS  . carvedilol  6.25 mg Oral BID  .  furosemide  60 mg Intravenous Q12H  . hydrochlorothiazide  25 mg Oral Daily  . insulin aspart  0-15 Units Subcutaneous TID WC  . insulin glargine  20 Units Subcutaneous QHS  . potassium chloride  20 mEq Oral Daily  . rivaroxaban  20 mg Oral Q supper  . sodium chloride flush  3 mL Intravenous Q12H   Continuous Infusions: . sodium chloride       LOS: 2 days   Marylu Lund, MD [facility] Pager On Amion  If 7PM-7AM, please contact night-coverage 02/21/2019, 5:13 PM

## 2019-02-22 LAB — GLUCOSE, CAPILLARY
Glucose-Capillary: 193 mg/dL — ABNORMAL HIGH (ref 70–99)
Glucose-Capillary: 143 mg/dL — ABNORMAL HIGH (ref 70–99)
Glucose-Capillary: 185 mg/dL — ABNORMAL HIGH (ref 70–99)
Glucose-Capillary: 167 mg/dL — ABNORMAL HIGH (ref 70–99)

## 2019-02-22 LAB — BASIC METABOLIC PANEL
Anion gap: 12 (ref 5–15)
BUN: 21 mg/dL — ABNORMAL HIGH (ref 6–20)
CO2: 25 mmol/L (ref 22–32)
Calcium: 8.5 mg/dL — ABNORMAL LOW (ref 8.9–10.3)
Chloride: 96 mmol/L — ABNORMAL LOW (ref 98–111)
Creatinine, Ser: 0.98 mg/dL (ref 0.44–1.00)
GFR calc Af Amer: >60 mL/min (ref >60)
GFR calc non Af Amer: >60 mL/min (ref >60)
Glucose, Bld: 150 mg/dL — ABNORMAL HIGH (ref 70–99)
Potassium: 3.5 mmol/L (ref 3.5–5.1)
Sodium: 133 mmol/L — ABNORMAL LOW (ref 135–145)

## 2019-02-22 MED ORDER — ZOLPIDEM TARTRATE 5 MG PO TABS
5.0000 mg | ORAL_TABLET | Freq: Once | ORAL | Status: AC
  Administered 2019-02-22: 5 mg via ORAL
  Filled 2019-02-22: qty 1

## 2019-02-22 NOTE — Progress Notes (Signed)
PROGRESS NOTE    [name]  EZM:[id number] DOB: 09/27/1975 DOA: 02/19/2019 PCP: [name], DO    Brief Narrative:  43 y.o. female with medical history significant for HFrEF (EF 20-25% by TTE 12/31/2018), paroxysmal atrial fibrillation on Xarelto, insulin-dependent type 2 diabetes, hypertension, hyperlipidemia, asthma, morbid obesity, and OSA who presents to the ED for evaluation of dyspnea.   Patient was recently admitted at [facility] from 02/06/2019-02/09/2019 for DKA.  Her home Lasix, Entresto, and HCTZ were held during hospital stay.  She was discharged on Lantus 20 units daily.  Patient states that when she left the hospital she was feeling better but not quite to her usual baseline.  She says she has had progressive generalized weakness and then yesterday developed new onset of shortness of breath.  She has had associated orthopnea and paroxysmal nocturnal dyspnea.  She has noticed increased swelling in both of her legs.  She reports intermittent central chest discomfort without radiation to her neck, jaw, or arms.  She reports associated fatigue.  She has felt intermittent palpitations.  She otherwise denies any subjective fevers, diaphoresis, abdominal pain, dysuria, diarrhea, constipation.  She states that she has been taking her medications as prescribed.  ED Course:  Initial vitals showed BP 144/113, pulse 125, RR 19, temp 97.6 Fahrenheit, SPO2 99% on room air.  Labs notable for sodium 134, potassium 4.6, bicarb 21, BUN 21, creatinine 1.00, serum glucose 273, BNP 654.7, high-sensitivity troponin 64.  Urinalysis showed negative nitrites, negative leukocytes, negative ketones, >300 protein, rare bacteria on microscopy.  SARS-CoV-2 test was negative.  2 view chest x-ray showed cardiomegaly without focal consolidation, effusion, or significant pulmonary edema.  Patient was given IV Lasix 40 mg once and albuterol inhaler treatment.  The hospitalist service was  consulted to admit for further evaluation   Assessment & Plan:   Principal Problem:   Acute on chronic HFrEF (heart failure with reduced ejection fraction) ([place]) Active Problems:   Type 2 diabetes mellitus ([place])   Hypertension associated with diabetes ([place])   Obstructive sleep apnea   Hyperlipidemia associated with type 2 diabetes mellitus ([place])   Paroxysmal atrial fibrillation ([place])   Asthma  Acute on chronic heart failure with reduced ejection fraction: -EF 20-25% by TTE 12/31/2018. Received IV Lasix 40 mg in the ED with good urine output so far (not recorded).  Previously on Entresto, which appears to have been discontinued by her primary care as she is reportedly trying to conceive.  Will check urine pregnancy test. -Continue IV Lasix 60 mg twice daily -Continue carvedilol 6.25 mg p.o. twice daily -Monitor daily weights and strict I/O's -Over 2L urine output over past 24hrs, wt down to 98.6kg from 102kg on admit -Still clinically volume overloaded, albeit improved. Will continue IV lasix  Paroxysmal atrial fibrillation: CHA2DS2-VASc Score is at least 4.  Rhythm is sinus tachycardia on admission. -Continue home carvedilol -Continue Xarelto as pt tolerates -Stable  Insulin-dependent type 2 diabetes with hyperglycemia: -A1c >15.5 on 02/06/2019.   -Previously documented nonadherence to home medications. -Continue on Lantus 20 units plus SSI -Glucose stable  Hypertension: -Hypertensive at time of admission. -Continue home carvedilol, amlodipine, and Lasix diuresis as tolerated -BP presently stable  Hyperlipidemia: -Home statin recently discontinued by her primary care physician reportedly try to conceive. -Stable  Asthma: -No active wheezing on admission.   -continue with albuterol as needed -without wheezing at this time  OSA: -Patient states she is not using her CPAP due to claustrophobia.   -She  has tried nasal route and reports intolerance.  She is not willing  to try CPAP here. -Continue with supplemental O2 via [place] nightly as tolerated  DVT prophylaxis: Xarelto Code Status: Full Family Communication: Pt in room, family not at bedside Disposition Plan: Uncertain at this time  Consultants:     Procedures:     Antimicrobials: Anti-infectives (From admission, onward)   None      Subjective: Reports feeling somewhat better  Objective: Vitals:   02/21/19 1442 02/21/19 2031 02/22/19 0633 02/22/19 1400  BP: 115/88 (!) 111/100 (!) 133/101 (!) 128/93  Pulse: 94 99 95 89  Resp: 20 18 17 18   Temp: (!) 97.5 F (36.4 C) 97.7 F (36.5 C) 97.7 F (36.5 C) 98 F (36.7 C)  TempSrc: Oral Oral Oral Oral  SpO2: 97% 99% 100% 100%  Weight:   98.6 kg   Height:        Intake/Output Summary (Last 24 hours) at 02/22/2019 1525 Last data filed at 02/22/2019 1436 Gross per 24 hour  Intake 240 ml  Output 3100 ml  Net -2860 ml   Filed Weights   02/20/19 0531 02/21/19 0500 02/22/19 3419  Weight: 102.9 kg 100.4 kg 98.6 kg    Examination: General exam: Conversant, in no acute distress Respiratory system: normal chest rise, clear, no audible wheezing Cardiovascular system: regular rhythm, s1-s2 Gastrointestinal system: Nondistended, nontender, pos BS Central nervous system: No seizures, no tremors Extremities: No cyanosis, no joint deformities, LE edema Skin: No rashes, no pallor Psychiatry: Affect normal // no auditory hallucinations    Data Reviewed: I have personally reviewed following labs and imaging studies  CBC: Recent Labs  Lab 02/20/19 0501  WBC 9.4  HGB 11.2*  HCT 37.6  MCV 69.9*  PLT 379   Basic Metabolic Panel: Recent Labs  Lab 02/19/19 2026 02/20/19 0501 02/21/19 0500 02/22/19 0442  NA 134*  --  133* 133*  K 4.6  --  4.3 3.5  CL 102  --  100 96*  CO2 21*  --  23 25  GLUCOSE 273*  --  148* 150*  BUN 21*  --  20 21*  CREATININE 1.00  --  1.01* 0.98  CALCIUM 8.5*  --  8.5* 8.5*  MG  --  1.6*  --   --     GFR: Estimated Creatinine Clearance: 74.7 mL/min (by C-G formula based on SCr of 0.98 mg/dL). Liver Function Tests: Recent Labs  Lab 02/19/19 2026  AST 39  ALT 26  ALKPHOS 162*  BILITOT 2.0*  PROT 7.2  ALBUMIN 2.5*   No results for input(s): LIPASE, AMYLASE in the last 168 hours. No results for input(s): AMMONIA in the last 168 hours. Coagulation Profile: No results for input(s): INR, PROTIME in the last 168 hours. Cardiac Enzymes: No results for input(s): CKTOTAL, CKMB, CKMBINDEX, TROPONINI in the last 168 hours. BNP (last 3 results) No results for input(s): PROBNP in the last 8760 hours. HbA1C: No results for input(s): HGBA1C in the last 72 hours. CBG: Recent Labs  Lab 02/21/19 1150 02/21/19 1658 02/21/19 2033 02/22/19 0756 02/22/19 1216  GLUCAP 201* 121* 198* 193* 143*   Lipid Profile: No results for input(s): CHOL, HDL, LDLCALC, TRIG, CHOLHDL, LDLDIRECT in the last 72 hours. Thyroid Function Tests: No results for input(s): TSH, T4TOTAL, FREET4, T3FREE, THYROIDAB in the last 72 hours. Anemia Panel: No results for input(s): VITAMINB12, FOLATE, FERRITIN, TIBC, IRON, RETICCTPCT in the last 72 hours. Sepsis Labs: Recent Labs  Lab 02/19/19 2258  02/20/19 0501 02/20/19 0824  LATICACIDVEN 2.3* 3.1* 1.8    Recent Results (from the past 240 hour(s))  Urine culture     Status: Abnormal   Collection Time: 02/19/19  8:58 PM   Specimen: Nasopharyngeal Swab; Urine  Result Value Ref Range Status   Specimen Description   Final    Urine Performed at [facility] [address] [place], [place] [address]    Special Requests   Final    NONE Performed at [facility] [place] [address] [place], [place] [address]    Culture MULTIPLE SPECIES PRESENT, SUGGEST RECOLLECTION (A)  Final   Report Status 02/20/2019 FINAL  Final  SARS Coronavirus 2 (CEPHEID- Performed in [place] hospital lab), Hosp Order     Status: None   Collection  Time: 02/19/19  8:59 PM   Specimen: Nasopharyngeal Swab  Result Value Ref Range Status   SARS Coronavirus 2 NEGATIVE NEGATIVE Final    Comment: (NOTE) If result is NEGATIVE SARS-CoV-2 target nucleic acids are NOT DETECTED. The SARS-CoV-2 RNA is generally detectable in upper and lower  respiratory specimens during the acute phase of infection. The lowest  concentration of SARS-CoV-2 viral copies this assay can detect is 250  copies / mL. A negative result does not preclude SARS-CoV-2 infection  and should not be used as the sole basis for treatment or other  patient management decisions.  A negative result may occur with  improper specimen collection / handling, submission of specimen other  than nasopharyngeal swab, presence of viral mutation(s) within the  areas targeted by this assay, and inadequate number of viral copies  (<250 copies / mL). A negative result must be combined with clinical  observations, patient history, and epidemiological information. If result is POSITIVE SARS-CoV-2 target nucleic acids are DETECTED. The SARS-CoV-2 RNA is generally detectable in upper and lower  respiratory specimens dur ing the acute phase of infection.  Positive  results are indicative of active infection with SARS-CoV-2.  Clinical  correlation with patient history and other diagnostic information is  necessary to determine patient infection status.  Positive results do  not rule out bacterial infection or co-infection with other viruses. If result is PRESUMPTIVE POSTIVE SARS-CoV-2 nucleic acids MAY BE PRESENT.   A presumptive positive result was obtained on the submitted specimen  and confirmed on repeat testing.  While 2019 novel coronavirus  (SARS-CoV-2) nucleic acids may be present in the submitted sample  additional confirmatory testing may be necessary for epidemiological  and / or clinical management purposes  to differentiate between  SARS-CoV-2 and other Sarbecovirus currently known  to infect humans.  If clinically indicated additional testing with an alternate test  methodology [phone]) is advised. The SARS-CoV-2 RNA is generally  detectable in upper and lower respiratory sp ecimens during the acute  phase of infection. The expected result is Negative. Fact Sheet for Patients:  StrictlyIdeas.no Fact Sheet for Healthcare Providers: BankingDealers.co.za This test is not yet approved or cleared by the [place] FDA and has been authorized for detection and/or diagnosis of SARS-CoV-2 by FDA under an Emergency Use Authorization (EUA).  This EUA will remain in effect (meaning this test can be used) for the duration of the COVID-19 declaration under Section 564(b)(1) of the Act, 21 U.S.C. section 360bbb-3(b)(1), unless the authorization is terminated or revoked sooner. Performed at [facility], [place] [address], [place], [place] [address]      Radiology Studies: No results found.  Scheduled  Meds: . amLODipine  5 mg Oral QHS  . carvedilol  6.25 mg Oral BID  . furosemide  60 mg Intravenous Q12H  . hydrochlorothiazide  25 mg Oral Daily  . insulin aspart  0-15 Units Subcutaneous TID WC  . insulin glargine  20 Units Subcutaneous QHS  . potassium chloride  20 mEq Oral Daily  . rivaroxaban  20 mg Oral Q supper  . sodium chloride flush  3 mL Intravenous Q12H   Continuous Infusions: . sodium chloride       LOS: 3 days   Marylu Lund, MD [facility] Pager On Amion  If 7PM-7AM, please contact night-coverage 02/22/2019, 3:25 PM

## 2019-02-22 NOTE — Progress Notes (Signed)
Report received from S. Adamson,RN. No change in assessment. [name]

## 2019-02-23 LAB — GLUCOSE, CAPILLARY
Glucose-Capillary: 119 mg/dL — ABNORMAL HIGH (ref 70–99)
Glucose-Capillary: 111 mg/dL — ABNORMAL HIGH (ref 70–99)
Glucose-Capillary: 131 mg/dL — ABNORMAL HIGH (ref 70–99)
Glucose-Capillary: 176 mg/dL — ABNORMAL HIGH (ref 70–99)

## 2019-02-23 LAB — BASIC METABOLIC PANEL
Anion gap: 8 (ref 5–15)
BUN: 28 mg/dL — ABNORMAL HIGH (ref 6–20)
CO2: 30 mmol/L (ref 22–32)
Calcium: 8.5 mg/dL — ABNORMAL LOW (ref 8.9–10.3)
Chloride: 97 mmol/L — ABNORMAL LOW (ref 98–111)
Creatinine, Ser: 1.06 mg/dL — ABNORMAL HIGH (ref 0.44–1.00)
GFR calc Af Amer: >60 mL/min (ref >60)
GFR calc non Af Amer: >60 mL/min (ref >60)
Glucose, Bld: 110 mg/dL — ABNORMAL HIGH (ref 70–99)
Potassium: 3.2 mmol/L — ABNORMAL LOW (ref 3.5–5.1)
Sodium: 135 mmol/L (ref 135–145)

## 2019-02-23 MED ORDER — KETOROLAC TROMETHAMINE 30 MG/ML IJ SOLN
30.0000 mg | Freq: Once | INTRAMUSCULAR | Status: AC
  Administered 2019-02-23: 30 mg via INTRAVENOUS
  Filled 2019-02-23: qty 1

## 2019-02-23 MED ORDER — POTASSIUM CHLORIDE CRYS ER 20 MEQ PO TBCR
40.0000 meq | EXTENDED_RELEASE_TABLET | Freq: Two times a day (BID) | ORAL | Status: AC
  Administered 2019-02-23 (×2): 40 meq via ORAL
  Filled 2019-02-23 (×2): qty 2

## 2019-02-23 MED ORDER — ZOLPIDEM TARTRATE 5 MG PO TABS
5.0000 mg | ORAL_TABLET | Freq: Once | ORAL | Status: AC
  Administered 2019-02-23: 5 mg via ORAL
  Filled 2019-02-23: qty 1

## 2019-02-23 MED ORDER — POTASSIUM CHLORIDE 20 MEQ/15ML (10%) PO SOLN
20.0000 meq | Freq: Every day | ORAL | Status: DC
  Administered 2019-02-24 – 2019-02-25 (×2): 20 meq via ORAL
  Filled 2019-02-23 (×2): qty 15

## 2019-02-23 NOTE — Progress Notes (Signed)
PROGRESS NOTE    [name]  DJS:[id number] DOB: [birth year] DOA: 02/19/2019 PCP: [name], DO    Brief Narrative:  43 y.o. female with medical history significant for HFrEF (EF 20-25% by TTE 12/31/2018), paroxysmal atrial fibrillation on Xarelto, insulin-dependent type 2 diabetes, hypertension, hyperlipidemia, asthma, morbid obesity, and OSA who presents to the ED for evaluation of dyspnea.   Patient was recently admitted at [facility] from 02/06/2019-02/09/2019 for DKA.  Her home Lasix, Entresto, and HCTZ were held during hospital stay.  She was discharged on Lantus 20 units daily.  Patient states that when she left the hospital she was feeling better but not quite to her usual baseline.  She says she has had progressive generalized weakness and then yesterday developed new onset of shortness of breath.  She has had associated orthopnea and paroxysmal nocturnal dyspnea.  She has noticed increased swelling in both of her legs.  She reports intermittent central chest discomfort without radiation to her neck, jaw, or arms.  She reports associated fatigue.  She has felt intermittent palpitations.  She otherwise denies any subjective fevers, diaphoresis, abdominal pain, dysuria, diarrhea, constipation.  She states that she has been taking her medications as prescribed.  ED Course:  Initial vitals showed BP 144/113, pulse 125, RR 19, temp 97.6 Fahrenheit, SPO2 99% on room air.  Labs notable for sodium 134, potassium 4.6, bicarb 21, BUN 21, creatinine 1.00, serum glucose 273, BNP 654.7, high-sensitivity troponin 64.  Urinalysis showed negative nitrites, negative leukocytes, negative ketones, >300 protein, rare bacteria on microscopy.  SARS-CoV-2 test was negative.  2 view chest x-ray showed cardiomegaly without focal consolidation, effusion, or significant pulmonary edema.  Patient was given IV Lasix 40 mg once and albuterol inhaler treatment.  The hospitalist service was  consulted to admit for further evaluation   Assessment & Plan:   Principal Problem:   Acute on chronic HFrEF (heart failure with reduced ejection fraction) ([place]) Active Problems:   Type 2 diabetes mellitus ([place])   Hypertension associated with diabetes ([place])   Obstructive sleep apnea   Hyperlipidemia associated with type 2 diabetes mellitus ([place])   Paroxysmal atrial fibrillation ([place])   Asthma  Acute on chronic heart failure with reduced ejection fraction: -EF 20-25% by TTE 12/31/2018. Received IV Lasix 40 mg in the ED with good urine output so far (not recorded).  Previously on Entresto, which appears to have been discontinued by her primary care as she is reportedly trying to conceive.  Will check urine pregnancy test. -Continue IV Lasix 60 mg twice daily -Continue carvedilol 6.25 mg p.o. twice daily -Monitor daily weights and strict I/O's -Over 2.4L urine output over past 24hrs, wt down to 96kg from 102kg on admit -Still clinically volume overloaded, albeit improved. Will continue IV lasix as tolerated and repeat bmet in AM  Paroxysmal atrial fibrillation: CHA2DS2-VASc Score is at least 4.  Rhythm is sinus tachycardia on admission. -Continue home carvedilol -Continue Xarelto as pt tolerates -Stable at this time  Insulin-dependent type 2 diabetes with hyperglycemia: -A1c >15.5 on 02/06/2019.   -Previously documented nonadherence to home medications. -Continue on Lantus 20 units plus SSI -Glucose currently stable  Hypertension: -Hypertensive at time of admission. -Continue home carvedilol, amlodipine, and Lasix diuresis as tolerated -BP currently stable  Hyperlipidemia: -Home statin recently discontinued by her primary care physician reportedly try to conceive. -Currently stable  Asthma: -No active wheezing on admission.   -continue with albuterol as needed -No wheezing at this time  OSA: -Patient states  she is not using her CPAP due to claustrophobia.   -She has  tried nasal route and reports intolerance.  She is not willing to try CPAP here. -Continue with supplemental O2 via [place] nightly as tolerated  DVT prophylaxis: Xarelto Code Status: Full Family Communication: Pt in room, family not at bedside Disposition Plan: Uncertain at this time  Consultants:     Procedures:     Antimicrobials: Anti-infectives (From admission, onward)   None      Subjective: Feeling better. Still with LE swelling  Objective: Vitals:   02/22/19 2233 02/23/19 0500 02/23/19 0601 02/23/19 1436  BP: (!) 117/101  116/85 101/77  Pulse: 96  88 88  Resp: 18  18 (!) 22  Temp: (!) 97.5 F (36.4 C)  97.7 F (36.5 C) (!) 97.5 F (36.4 C)  TempSrc: Oral  Oral Oral  SpO2: 100%  100% 99%  Weight:  96.9 kg    Height:        Intake/Output Summary (Last 24 hours) at 02/23/2019 1821 Last data filed at 02/23/2019 1300 Gross per 24 hour  Intake 360 ml  Output 2800 ml  Net -2440 ml   Filed Weights   02/21/19 0500 02/22/19 0633 02/23/19 0500  Weight: 100.4 kg 98.6 kg 96.9 kg    Examination: General exam: Awake, laying in bed, in nad Respiratory system: Normal respiratory effort, no wheezing Cardiovascular system: regular rate, s1, s2 Gastrointestinal system: Soft, nondistended, positive BS Central nervous system: CN2-12 grossly intact, strength intact Extremities: Perfused, no clubbing, BLE edema Skin: Normal skin turgor, no notable skin lesions seen Psychiatry: Mood normal // no visual hallucinations   Data Reviewed: I have personally reviewed following labs and imaging studies  CBC: Recent Labs  Lab 02/20/19 0501  WBC 9.4  HGB 11.2*  HCT 37.6  MCV 69.9*  PLT 160   Basic Metabolic Panel: Recent Labs  Lab 02/19/19 2026 02/20/19 0501 02/21/19 0500 02/22/19 0442 02/23/19 0606  NA 134*  --  133* 133* 135  K 4.6  --  4.3 3.5 3.2*  CL 102  --  100 96* 97*  CO2 21*  --  23 25 30   GLUCOSE 273*  --  148* 150* 110*  BUN 21*  --  20 21* 28*   CREATININE 1.00  --  1.01* 0.98 1.06*  CALCIUM 8.5*  --  8.5* 8.5* 8.5*  MG  --  1.6*  --   --   --    GFR: Estimated Creatinine Clearance: 68.3 mL/min (A) (by C-G formula based on SCr of 1.06 mg/dL (H)). Liver Function Tests: Recent Labs  Lab 02/19/19 2026  AST 39  ALT 26  ALKPHOS 162*  BILITOT 2.0*  PROT 7.2  ALBUMIN 2.5*   No results for input(s): LIPASE, AMYLASE in the last 168 hours. No results for input(s): AMMONIA in the last 168 hours. Coagulation Profile: No results for input(s): INR, PROTIME in the last 168 hours. Cardiac Enzymes: No results for input(s): CKTOTAL, CKMB, CKMBINDEX, TROPONINI in the last 168 hours. BNP (last 3 results) No results for input(s): PROBNP in the last 8760 hours. HbA1C: No results for input(s): HGBA1C in the last 72 hours. CBG: Recent Labs  Lab 02/22/19 1654 02/22/19 2229 02/23/19 0559 02/23/19 1147 02/23/19 1635  GLUCAP 185* 167* 119* 111* 131*   Lipid Profile: No results for input(s): CHOL, HDL, LDLCALC, TRIG, CHOLHDL, LDLDIRECT in the last 72 hours. Thyroid Function Tests: No results for input(s): TSH, T4TOTAL, FREET4, T3FREE, THYROIDAB in the  last 72 hours. Anemia Panel: No results for input(s): VITAMINB12, FOLATE, FERRITIN, TIBC, IRON, RETICCTPCT in the last 72 hours. Sepsis Labs: Recent Labs  Lab 02/19/19 2258 02/20/19 0501 02/20/19 0824  LATICACIDVEN 2.3* 3.1* 1.8    Recent Results (from the past 240 hour(s))  Urine culture     Status: Abnormal   Collection Time: 02/19/19  8:58 PM   Specimen: Nasopharyngeal Swab; Urine  Result Value Ref Range Status   Specimen Description   Final    Urine Performed at [facility] [address], [place], [place] [address]    Special Requests   Final    NONE Performed at [facility], [place] [address], [place], [place] [address]    Culture MULTIPLE SPECIES PRESENT, SUGGEST RECOLLECTION (A)  Final   Report Status 02/20/2019 FINAL  Final   SARS Coronavirus 2 (CEPHEID- Performed in [facility] hospital lab), Hosp Order     Status: None   Collection Time: 02/19/19  8:59 PM   Specimen: Nasopharyngeal Swab  Result Value Ref Range Status   SARS Coronavirus 2 NEGATIVE NEGATIVE Final    Comment: (NOTE) If result is NEGATIVE SARS-CoV-2 target nucleic acids are NOT DETECTED. The SARS-CoV-2 RNA is generally detectable in upper and lower  respiratory specimens during the acute phase of infection. The lowest  concentration of SARS-CoV-2 viral copies this assay can detect is 250  copies / mL. A negative result does not preclude SARS-CoV-2 infection  and should not be used as the sole basis for treatment or other  patient management decisions.  A negative result may occur with  improper specimen collection / handling, submission of specimen other  than nasopharyngeal swab, presence of viral mutation(s) within the  areas targeted by this assay, and inadequate number of viral copies  (<250 copies / mL). A negative result must be combined with clinical  observations, patient history, and epidemiological information. If result is POSITIVE SARS-CoV-2 target nucleic acids are DETECTED. The SARS-CoV-2 RNA is generally detectable in upper and lower  respiratory specimens dur ing the acute phase of infection.  Positive  results are indicative of active infection with SARS-CoV-2.  Clinical  correlation with patient history and other diagnostic information is  necessary to determine patient infection status.  Positive results do  not rule out bacterial infection or co-infection with other viruses. If result is PRESUMPTIVE POSTIVE SARS-CoV-2 nucleic acids MAY BE PRESENT.   A presumptive positive result was obtained on the submitted specimen  and confirmed on repeat testing.  While 2019 novel coronavirus  (SARS-CoV-2) nucleic acids may be present in the submitted sample  additional confirmatory testing may be necessary for epidemiological  and  / or clinical management purposes  to differentiate between  SARS-CoV-2 and other Sarbecovirus currently known to infect humans.  If clinically indicated additional testing with an alternate test  methodology [phone]) is advised. The SARS-CoV-2 RNA is generally  detectable in upper and lower respiratory sp ecimens during the acute  phase of infection. The expected result is Negative. Fact Sheet for Patients:  StrictlyIdeas.no Fact Sheet for Healthcare Providers: BankingDealers.co.za This test is not yet approved or cleared by the [place] FDA and has been authorized for detection and/or diagnosis of SARS-CoV-2 by FDA under an Emergency Use Authorization (EUA).  This EUA will remain in effect (meaning this test can be used) for the duration of the COVID-19 declaration under Section 564(b)(1) of the Act, 21 U.S.C. section 360bbb-3(b)(1), unless the authorization is terminated  or revoked sooner. Performed at [facility], [place] [address] [place], [place] [address]      Radiology Studies: No results found.  Scheduled Meds: . amLODipine  5 mg Oral QHS  . carvedilol  6.25 mg Oral BID  . furosemide  60 mg Intravenous Q12H  . hydrochlorothiazide  25 mg Oral Daily  . insulin aspart  0-15 Units Subcutaneous TID WC  . insulin glargine  20 Units Subcutaneous QHS  . [START ON 02/24/2019] potassium chloride  20 mEq Oral Daily  . potassium chloride  40 mEq Oral BID  . rivaroxaban  20 mg Oral Q supper  . sodium chloride flush  3 mL Intravenous Q12H   Continuous Infusions: . sodium chloride       LOS: 4 days   Marylu Lund, MD [facility] Pager On Amion  If 7PM-7AM, please contact night-coverage 02/23/2019, 6:21 PM

## 2019-02-24 DIAGNOSIS — G4733 Obstructive sleep apnea (adult) (pediatric): Secondary | ICD-10-CM

## 2019-02-24 LAB — BASIC METABOLIC PANEL
Anion gap: 11 (ref 5–15)
BUN: 33 mg/dL — ABNORMAL HIGH (ref 6–20)
CO2: 28 mmol/L (ref 22–32)
Calcium: 8.5 mg/dL — ABNORMAL LOW (ref 8.9–10.3)
Chloride: 96 mmol/L — ABNORMAL LOW (ref 98–111)
Creatinine, Ser: 1.21 mg/dL — ABNORMAL HIGH (ref 0.44–1.00)
GFR calc Af Amer: >60 mL/min (ref >60)
GFR calc non Af Amer: 56 mL/min — ABNORMAL LOW (ref >60)
Glucose, Bld: 134 mg/dL — ABNORMAL HIGH (ref 70–99)
Potassium: 3.7 mmol/L (ref 3.5–5.1)
Sodium: 135 mmol/L (ref 135–145)

## 2019-02-24 LAB — GLUCOSE, CAPILLARY
Glucose-Capillary: 106 mg/dL — ABNORMAL HIGH (ref 70–99)
Glucose-Capillary: 98 mg/dL (ref 70–99)
Glucose-Capillary: 152 mg/dL — ABNORMAL HIGH (ref 70–99)
Glucose-Capillary: 217 mg/dL — ABNORMAL HIGH (ref 70–99)

## 2019-02-24 LAB — MAGNESIUM: Magnesium: 1.7 mg/dL (ref 1.7–2.4)

## 2019-02-24 MED ORDER — CARVEDILOL 12.5 MG PO TABS
12.5000 mg | ORAL_TABLET | Freq: Two times a day (BID) | ORAL | Status: DC
  Administered 2019-02-24 – 2019-02-25 (×2): 12.5 mg via ORAL
  Filled 2019-02-24 (×2): qty 1

## 2019-02-24 MED ORDER — ZOLPIDEM TARTRATE 5 MG PO TABS
5.0000 mg | ORAL_TABLET | Freq: Every evening | ORAL | Status: DC | PRN
  Administered 2019-02-24: 5 mg via ORAL
  Filled 2019-02-24: qty 1

## 2019-02-24 MED ORDER — KETOROLAC TROMETHAMINE 15 MG/ML IJ SOLN
15.0000 mg | Freq: Three times a day (TID) | INTRAMUSCULAR | Status: DC | PRN
  Administered 2019-02-24 (×2): 15 mg via INTRAVENOUS
  Filled 2019-02-24 (×2): qty 1

## 2019-02-24 MED ORDER — MAGNESIUM SULFATE 2 GM/50ML IV SOLN
2.0000 g | Freq: Once | INTRAVENOUS | Status: AC
  Administered 2019-02-24: 2 g via INTRAVENOUS
  Filled 2019-02-24: qty 50

## 2019-02-24 NOTE — Progress Notes (Addendum)
CCMD notified this nurse of 11 beat run of [place] at 1604. Pt asymptomatic, vital signs stable. MD Wyline Copas notified. Will continue to monitor.

## 2019-02-24 NOTE — Progress Notes (Signed)
PROGRESS NOTE    [name]  FXT:[id number] DOB: July 23, 1976 DOA: 02/19/2019 PCP: [name], DO    Brief Narrative:  43 y.o. female with medical history significant for HFrEF (EF 20-25% by TTE 12/31/2018), paroxysmal atrial fibrillation on Xarelto, insulin-dependent type 2 diabetes, hypertension, hyperlipidemia, asthma, morbid obesity, and OSA who presents to the ED for evaluation of dyspnea.   Patient was recently admitted at [facility] from 02/06/2019-02/09/2019 for DKA.  Her home Lasix, Entresto, and HCTZ were held during hospital stay.  She was discharged on Lantus 20 units daily.  Patient states that when she left the hospital she was feeling better but not quite to her usual baseline.  She says she has had progressive generalized weakness and then yesterday developed new onset of shortness of breath.  She has had associated orthopnea and paroxysmal nocturnal dyspnea.  She has noticed increased swelling in both of her legs.  She reports intermittent central chest discomfort without radiation to her neck, jaw, or arms.  She reports associated fatigue.  She has felt intermittent palpitations.  She otherwise denies any subjective fevers, diaphoresis, abdominal pain, dysuria, diarrhea, constipation.  She states that she has been taking her medications as prescribed.  ED Course:  Initial vitals showed BP 144/113, pulse 125, RR 19, temp 97.6 Fahrenheit, SPO2 99% on room air.  Labs notable for sodium 134, potassium 4.6, bicarb 21, BUN 21, creatinine 1.00, serum glucose 273, BNP 654.7, high-sensitivity troponin 64.  Urinalysis showed negative nitrites, negative leukocytes, negative ketones, >300 protein, rare bacteria on microscopy.  SARS-CoV-2 test was negative.  2 view chest x-ray showed cardiomegaly without focal consolidation, effusion, or significant pulmonary edema.  Patient was given IV Lasix 40 mg once and albuterol inhaler treatment.  The hospitalist service was  consulted to admit for further evaluation   Assessment & Plan:   Principal Problem:   Acute on chronic HFrEF (heart failure with reduced ejection fraction) ([place]) Active Problems:   Type 2 diabetes mellitus ([place])   Hypertension associated with diabetes ([place])   Obstructive sleep apnea   Hyperlipidemia associated with type 2 diabetes mellitus ([place])   Paroxysmal atrial fibrillation ([place])   Asthma  Acute on chronic heart failure with reduced ejection fraction: -EF 20-25% by TTE 12/31/2018. Received IV Lasix 40 mg in the ED with good urine output so far (not recorded).  Previously on Entresto, which appears to have been discontinued by her primary care as she is reportedly trying to conceive.  Will check urine pregnancy test. -Continue IV Lasix 60 mg twice daily -Continue carvedilol 6.25 mg p.o. twice daily -Monitor daily weights and strict I/O's -Over 1.9L urine output over past 24hrs, wt down to 93.6kg from 102kg on admit -Cr beginning to rise. Will cont IV lasix x 1 more day -Repeat bmet in AM  Paroxysmal atrial fibrillation: CHA2DS2-VASc Score is at least 4.  Rhythm is sinus tachycardia on admission. -Continue home carvedilol -Continue Xarelto as pt tolerates -Remains stable at this time  Insulin-dependent type 2 diabetes with hyperglycemia: -A1c >15.5 on 02/06/2019.   -Previously documented nonadherence to home medications. -Continue on Lantus 20 units plus SSI -Glucose remains stable  Hypertension: -Hypertensive at time of admission. -Continue home carvedilol, amlodipine, and Lasix diuresis as tolerated -BP presently stable  Hyperlipidemia: -Home statin recently discontinued by her primary care physician reportedly try to conceive. -Currently stable  Asthma: -No active wheezing on admission.   -continue with albuterol as needed -without wheezing at this time  OSA: -Patient states  she is not using her CPAP due to claustrophobia.   -She has tried nasal route and  reports intolerance.  She is not willing to try CPAP here. -Continue with supplemental O2 via [place] nightly as tolerated  DVT prophylaxis: Xarelto Code Status: Full Family Communication: Pt in room, family not at bedside Disposition Plan: Possible d/c home in 24hrs if stable  Consultants:     Procedures:     Antimicrobials: Anti-infectives (From admission, onward)   None      Subjective: Without complaints  Objective: Vitals:   02/23/19 2035 02/24/19 0500 02/24/19 0501 02/24/19 1553  BP: 115/86  107/77 110/90  Pulse: 96  95 87  Resp: 18  18 18   Temp: 97.8 F (36.6 C)  98.4 F (36.9 C) 97.9 F (36.6 C)  TempSrc: Oral  Oral Oral  SpO2: 98%  100% 100%  Weight:  93.6 kg    Height:        Intake/Output Summary (Last 24 hours) at 02/24/2019 1610 Last data filed at 02/24/2019 0500 Gross per 24 hour  Intake -  Output 300 ml  Net -300 ml   Filed Weights   02/22/19 8413 02/23/19 0500 02/24/19 0500  Weight: 98.6 kg 96.9 kg 93.6 kg    Examination: General exam: Conversant, in no acute distress Respiratory system: normal chest rise, clear, no audible wheezing Cardiovascular system: regular rhythm, s1-s2 Gastrointestinal system: Nondistended, nontender, pos BS Central nervous system: No seizures, no tremors Extremities: No cyanosis, no joint deformities Skin: No rashes, no pallor Psychiatry: Affect normal // no auditory hallucinations   Data Reviewed: I have personally reviewed following labs and imaging studies  CBC: Recent Labs  Lab 02/20/19 0501  WBC 9.4  HGB 11.2*  HCT 37.6  MCV 69.9*  PLT 244   Basic Metabolic Panel: Recent Labs  Lab 02/19/19 2026 02/20/19 0501 02/21/19 0500 02/22/19 0442 02/23/19 0606 02/24/19 0542  NA 134*  --  133* 133* 135 135  K 4.6  --  4.3 3.5 3.2* 3.7  CL 102  --  100 96* 97* 96*  CO2 21*  --  23 25 30 28   GLUCOSE 273*  --  148* 150* 110* 134*  BUN 21*  --  20 21* 28* 33*  CREATININE 1.00  --  1.01* 0.98 1.06* 1.21*   CALCIUM 8.5*  --  8.5* 8.5* 8.5* 8.5*  MG  --  1.6*  --   --   --   --    GFR: Estimated Creatinine Clearance: 58.5 mL/min (A) (by C-G formula based on SCr of 1.21 mg/dL (H)). Liver Function Tests: Recent Labs  Lab 02/19/19 2026  AST 39  ALT 26  ALKPHOS 162*  BILITOT 2.0*  PROT 7.2  ALBUMIN 2.5*   No results for input(s): LIPASE, AMYLASE in the last 168 hours. No results for input(s): AMMONIA in the last 168 hours. Coagulation Profile: No results for input(s): INR, PROTIME in the last 168 hours. Cardiac Enzymes: No results for input(s): CKTOTAL, CKMB, CKMBINDEX, TROPONINI in the last 168 hours. BNP (last 3 results) No results for input(s): PROBNP in the last 8760 hours. HbA1C: No results for input(s): HGBA1C in the last 72 hours. CBG: Recent Labs  Lab 02/23/19 1147 02/23/19 1635 02/23/19 2038 02/24/19 0745 02/24/19 1404  GLUCAP 111* 131* 176* 106* 98   Lipid Profile: No results for input(s): CHOL, HDL, LDLCALC, TRIG, CHOLHDL, LDLDIRECT in the last 72 hours. Thyroid Function Tests: No results for input(s): TSH, T4TOTAL, FREET4, T3FREE, THYROIDAB  in the last 72 hours. Anemia Panel: No results for input(s): VITAMINB12, FOLATE, FERRITIN, TIBC, IRON, RETICCTPCT in the last 72 hours. Sepsis Labs: Recent Labs  Lab 02/19/19 2258 02/20/19 0501 02/20/19 0824  LATICACIDVEN 2.3* 3.1* 1.8    Recent Results (from the past 240 hour(s))  Urine culture     Status: Abnormal   Collection Time: 02/19/19  8:58 PM   Specimen: Nasopharyngeal Swab; Urine  Result Value Ref Range Status   Specimen Description   Final    Urine Performed at [facility] [address], [place], [place] [address]    Special Requests   Final    NONE Performed at [facility], [place] [address], [place], [place] [address]    Culture MULTIPLE SPECIES PRESENT, SUGGEST RECOLLECTION (A)  Final   Report Status 02/20/2019 FINAL  Final  SARS Coronavirus 2 (CEPHEID-  Performed in [facility] hospital lab), Hosp Order     Status: None   Collection Time: 02/19/19  8:59 PM   Specimen: Nasopharyngeal Swab  Result Value Ref Range Status   SARS Coronavirus 2 NEGATIVE NEGATIVE Final    Comment: (NOTE) If result is NEGATIVE SARS-CoV-2 target nucleic acids are NOT DETECTED. The SARS-CoV-2 RNA is generally detectable in upper and lower  respiratory specimens during the acute phase of infection. The lowest  concentration of SARS-CoV-2 viral copies this assay can detect is 250  copies / mL. A negative result does not preclude SARS-CoV-2 infection  and should not be used as the sole basis for treatment or other  patient management decisions.  A negative result may occur with  improper specimen collection / handling, submission of specimen other  than nasopharyngeal swab, presence of viral mutation(s) within the  areas targeted by this assay, and inadequate number of viral copies  (<250 copies / mL). A negative result must be combined with clinical  observations, patient history, and epidemiological information. If result is POSITIVE SARS-CoV-2 target nucleic acids are DETECTED. The SARS-CoV-2 RNA is generally detectable in upper and lower  respiratory specimens dur ing the acute phase of infection.  Positive  results are indicative of active infection with SARS-CoV-2.  Clinical  correlation with patient history and other diagnostic information is  necessary to determine patient infection status.  Positive results do  not rule out bacterial infection or co-infection with other viruses. If result is PRESUMPTIVE POSTIVE SARS-CoV-2 nucleic acids MAY BE PRESENT.   A presumptive positive result was obtained on the submitted specimen  and confirmed on repeat testing.  While 2019 novel coronavirus  (SARS-CoV-2) nucleic acids may be present in the submitted sample  additional confirmatory testing may be necessary for epidemiological  and / or clinical management  purposes  to differentiate between  SARS-CoV-2 and other Sarbecovirus currently known to infect humans.  If clinically indicated additional testing with an alternate test  methodology [phone]) is advised. The SARS-CoV-2 RNA is generally  detectable in upper and lower respiratory sp ecimens during the acute  phase of infection. The expected result is Negative. Fact Sheet for Patients:  StrictlyIdeas.no Fact Sheet for Healthcare Providers: BankingDealers.co.za This test is not yet approved or cleared by the [place] FDA and has been authorized for detection and/or diagnosis of SARS-CoV-2 by FDA under an Emergency Use Authorization (EUA).  This EUA will remain in effect (meaning this test can be used) for the duration of the COVID-19 declaration under Section 564(b)(1) of the Act, 21 U.S.C. section 360bbb-3(b)(1), unless the authorization  is terminated or revoked sooner. Performed at [facility], [place] [address], [place], [place] [address]      Radiology Studies: No results found.  Scheduled Meds: . amLODipine  5 mg Oral QHS  . carvedilol  6.25 mg Oral BID  . furosemide  60 mg Intravenous Q12H  . hydrochlorothiazide  25 mg Oral Daily  . insulin aspart  0-15 Units Subcutaneous TID WC  . insulin glargine  20 Units Subcutaneous QHS  . potassium chloride  20 mEq Oral Daily  . rivaroxaban  20 mg Oral Q supper  . sodium chloride flush  3 mL Intravenous Q12H   Continuous Infusions: . sodium chloride       LOS: 5 days   Marylu Lund, MD [facility] Pager On Amion  If 7PM-7AM, please contact night-coverage 02/24/2019, 4:10 PM

## 2019-02-25 LAB — BASIC METABOLIC PANEL
Anion gap: 12 (ref 5–15)
BUN: 38 mg/dL — ABNORMAL HIGH (ref 6–20)
CO2: 27 mmol/L (ref 22–32)
Calcium: 8.5 mg/dL — ABNORMAL LOW (ref 8.9–10.3)
Chloride: 97 mmol/L — ABNORMAL LOW (ref 98–111)
Creatinine, Ser: 1.52 mg/dL — ABNORMAL HIGH (ref 0.44–1.00)
GFR calc Af Amer: 49 mL/min — ABNORMAL LOW (ref >60)
GFR calc non Af Amer: 42 mL/min — ABNORMAL LOW (ref >60)
Glucose, Bld: 147 mg/dL — ABNORMAL HIGH (ref 70–99)
Potassium: 4.2 mmol/L (ref 3.5–5.1)
Sodium: 136 mmol/L (ref 135–145)

## 2019-02-25 LAB — GLUCOSE, CAPILLARY
Glucose-Capillary: 142 mg/dL — ABNORMAL HIGH (ref 70–99)
Glucose-Capillary: 188 mg/dL — ABNORMAL HIGH (ref 70–99)

## 2019-02-25 MED ORDER — CARVEDILOL 12.5 MG PO TABS: 12.5000 mg | ORAL_TABLET | Freq: Two times a day (BID) | ORAL | 1 refills | Status: DC

## 2019-02-25 NOTE — TOC Initial Note (Signed)
Transition of Care [facility]) - Initial/Assessment Note    Patient Details  Name: [name] MRN: [id number] Date of Birth: [birth year]  Transition of Care (TOC) CM/SW Contact:    Joaquin Courts, RN Phone Number: 02/25/2019, 12:56 PM  Clinical Narrative:    CM spoke with patient at bedside regarding concerns about affording medications.  Patient reports she is working with someone from [facility] hospital for [facility] application from her previous hospital stay. Additionally she received her medications during her last stay for a cost of 12$. Patient is discharging today with coreg prescriptions which is available for 4$ at [facility]. CM informed patient of this. No further needs identified.                Expected Discharge Plan: Home/Self Care Barriers to Discharge: No Barriers Identified   Patient Goals and CMS Choice        Expected Discharge Plan and Services Expected Discharge Plan: Home/Self Care   Discharge Planning Services: CM Consult   Living arrangements for the past 2 months: Apartment Expected Discharge Date: 02/25/19               DME Arranged: N/A DME Agency: NA       HH Arranged: NA HH Agency: NA        Prior Living Arrangements/Services Living arrangements for the past 2 months: Apartment Lives with:: Relatives Patient language and need for interpreter reviewed:: Yes Do you feel safe going back to the place where you live?: Yes      Need for Family Participation in Patient Care: Yes (Comment) Care giver support system in place?: Yes (comment)   Criminal Activity/Legal Involvement Pertinent to Current Situation/Hospitalization: No - Comment as needed  Activities of Daily Living Home Assistive Devices/Equipment: None ADL Screening (condition at time of admission) Patient's cognitive ability adequate to safely complete daily activities?: Yes Is the patient deaf or have difficulty hearing?: No Does the patient have difficulty seeing, even when wearing  glasses/contacts?: No Does the patient have difficulty concentrating, remembering, or making decisions?: No Patient able to express need for assistance with ADLs?: Yes Does the patient have difficulty dressing or bathing?: No Independently performs ADLs?: Yes (appropriate for developmental age) Does the patient have difficulty walking or climbing stairs?: No Weakness of Legs: None Weakness of Arms/Hands: None  Permission Sought/Granted                  Emotional Assessment Appearance:: Appears stated age Attitude/Demeanor/Rapport: Engaged Affect (typically observed): Accepting Orientation: : Oriented to Place, Oriented to  Time, Oriented to Situation, Oriented to Self   Psych Involvement: No (comment)  Admission diagnosis:  Orthopnea [R06.01] Heart failure, systolic, with acute decompensation ([place]) [I50.23] Patient Active Problem List   Diagnosis Date Noted  . Acute on chronic HFrEF (heart failure with reduced ejection fraction) ([place]) 02/19/2019  . Asthma   . Hyperglycemia   . Yeast infection 02/06/2019  . Right ventricular failure ([place]) 02/06/2019  . Morbid obesity ([place]) 02/06/2019  . DKA, type 2 ([place]) 02/06/2019  . HFrEF (heart failure with reduced ejection fraction) ([place]) 12/31/2018  . Paroxysmal atrial fibrillation ([place]) 12/31/2018  . Anxiety 12/31/2018  . Nausea 12/27/2018  . Hyperlipidemia associated with type 2 diabetes mellitus ([place]) 10/30/2017  . Obstructive sleep apnea 06/30/2015  . Shortness of breath 06/29/2015  . Missed period 04/05/2013  . Anemia due to blood loss, chronic 06/27/2012  . Allergic rhinitis 06/27/2012  . Major depressive disorder 02/08/2012  . Hypertension associated  with diabetes ([place]) 12/04/2011  . Dysmenorrhea 11/22/2011  . Type 2 diabetes mellitus ([place]) 05/19/2011  . PCOS (polycystic ovarian syndrome) 05/19/2011  . Hydrosalpinx 08/21/2009   PCP:  [name], DO Pharmacy:   [facility], [address] [place] [address] Phone: [phone] Fax: [phone]  [facility] [phone] [place], [place] [address] Phone: [phone] Fax: [phone]  [facility] [place], [place] [address] [place] [address] Phone: [phone] Fax: [place], [place] [address] Phone: [phone] Fax: [phone]  [facility], [place] [address] [place] [address] Phone: [phone] Fax: [phone]     Social Determinants of Health (SDOH) Interventions    Readmission Risk Interventions No flowsheet data found.

## 2019-02-25 NOTE — Plan of Care (Signed)

## 2019-02-25 NOTE — Discharge Summary (Signed)
Physician Discharge Summary  [name]:[id number] DOB: 06-06-1976 DOA: 02/19/2019  PCP: [name], DO  Admit date: 02/19/2019 Discharge date: 02/25/2019  Admitted From: Home Disposition:  Home  Recommendations for Outpatient Follow-up:  1. Follow up with PCP in 1-2 weeks 2. Recommend repeat BMET in 1 week  Dry weight on discharge: 95kg Wt on initial presentation: 102.3kg  Discharge Condition:Improved CODE STATUS:Full Diet recommendation: Diabetic, heart healthy   Brief/Interim Summary: 43 y.o.femalewith medical history significant forHFrEF (EF 20-25%by TTE 12/31/2018), paroxysmal atrial fibrillation on Xarelto, insulin-dependent type 2 diabetes, hypertension, hyperlipidemia, asthma, morbid obesity, and OSA who presents to the ED forevaluation of dyspnea.   Patient was recently admitted at [facility] from 02/06/2019-02/09/2019 forDKA. Her home Lasix, Entresto, and HCTZ were held during hospital stay. She was discharged on Lantus 20 units daily.  Patient states that when she left the hospital she was feeling better but not quite to her usual baseline. She says she has had progressive generalized weakness and then yesterday developed new onset of shortness of breath. She has had associated orthopnea and paroxysmal nocturnal dyspnea. She has noticed increased swelling in both of her legs. She reports intermittent central chest discomfort without radiation to her neck, jaw, or arms. She reports associated fatigue. She has felt intermittent palpitations.  She otherwise denies any subjective fevers, diaphoresis, abdominal pain, dysuria, diarrhea, constipation. She states that she has been taking her medications as prescribed.  ED Course: Initial vitals showed BP 144/113, pulse 125, RR 19, temp 97.6 Fahrenheit, SPO2 99% on room air.  Labs notable for sodium 134, potassium 4.6, bicarb 21, BUN 21, creatinine 1.00, serum glucose 273, BNP 654.7, high-sensitivity  troponin 64.  Urinalysis showed negative nitrites, negative leukocytes, negative ketones,>300protein, rare bacteria on microscopy.  SARS-CoV-2 test was negative.  2 view chest x-ray showed cardiomegaly withoutfocal consolidation, effusion, or significant pulmonary edema.  Patient was given IV Lasix 40 mg once and albuterol inhaler treatment. The hospitalist service was consulted to admit for further evaluation   Discharge Diagnoses:  Principal Problem:   Acute on chronic HFrEF (heart failure with reduced ejection fraction) ([place]) Active Problems:   Type 2 diabetes mellitus ([place])   Hypertension associated with diabetes ([place])   Obstructive sleep apnea   Hyperlipidemia associated with type 2 diabetes mellitus ([place])   Paroxysmal atrial fibrillation ([place])   Asthma  Acute on chronic heart failure with reduced ejection fraction: -EF 20-25%by TTE 12/31/2018.Previously on Entresto, which appears to have been discontinued by her primary care as she is reportedly trying to conceive. -Continued on IV Lasix 60 mg twice daily -Continue carvedilol, dose increased to 12.63m BID given transient run of asymptomatic vtach -Monitor daily weights and strict I/O's -Successfully diuresed with wt down to 95kg from 102.3kg at presentation  Paroxysmal atrial fibrillation: CHA2DS2-VASc Scoreis at least 4. Rhythm is sinus tachycardia on admission. -Continue carvedilol per above -Continue Xarelto as pt tolerates -Remains stable at this time  Insulin-dependent type 2 diabetes with hyperglycemia: -A1c>15.5on 02/06/2019.  -Previously documented nonadherence to home medications. -Continue on Lantus 20 units plus SSI -Glucose remains stable  Hypertension: -Hypertensive at time of admission. -Continue home carvedilol, amlodipine, and Lasix diuresis as tolerated -BP presently stable  Hyperlipidemia: -Home statin recently discontinued by her primary care physician reportedly try to  conceive. -Currently stable  Asthma: -No active wheezing on admission.  -continue with albuterol as needed -without wheezing at this time  OSA: -Patient states she is not using her CPAP due to claustrophobia.  -She  has tried nasal route and reports intolerance. She is not willing to try CPAP here. -Continue with supplemental O2 via [place] nightly as tolerated   Discharge Instructions   Allergies as of 02/25/2019      Reactions   Shellfish Allergy Swelling   Airway involvement including EMS - Has Epi-Pen   Metformin And Related Diarrhea      Medication List    TAKE these medications   Accu-Chek Aviva Plus w/Device Kit 1 application by Does not apply route QID. Check fasting sugars as well as throughout the day   Accu-Chek Softclix Lancet Dev Kit 1 application by Does not apply route 4 (four) times daily.   Accu-Chek Softclix Lancets lancets Check fasting blood sugar daily in the morning   Accu-Chek Softclix Lancets lancets Use as instructed   albuterol 108 (90 Base) MCG/ACT inhaler Commonly known as: VENTOLIN HFA Inhale 2 puffs into the lungs every 6 (six) hours as needed for wheezing or shortness of breath.   amLODipine 5 MG tablet Commonly known as: NORVASC Take 1 tablet (5 mg total) by mouth at bedtime.   carvedilol 12.5 MG tablet Commonly known as: COREG Take 1 tablet (12.5 mg total) by mouth 2 (two) times daily. What changed:   medication strength  how much to take   furosemide 40 MG tablet Commonly known as: LASIX Take 1 tablet PO three times daily What changed:   how much to take  how to take this  when to take this  additional instructions   glucose blood test strip Commonly known as: AgaMatrix Presto Test Use as instructed   Accu-Chek Aviva Plus test strip Generic drug: glucose blood Use as instructed   hydrochlorothiazide 25 MG tablet Commonly known as: HYDRODIURIL Take 1 tablet (25 mg total) by mouth daily.   hydrOXYzine 10 MG  tablet Commonly known as: ATARAX/VISTARIL Take 1 tablet (10 mg total) by mouth 3 (three) times daily as needed. What changed: reasons to take this   insulin glargine 100 UNIT/ML injection Commonly known as: LANTUS Inject 0.2 mLs (20 Units total) into the skin daily.   rivaroxaban 20 MG Tabs tablet Commonly known as: Xarelto Take 1 tablet (20 mg total) by mouth daily with supper. What changed: Another medication with the same name was removed. Continue taking this medication, and follow the directions you see here.      Follow-up Information    [name], DO Follow up.   Specialty: Family Medicine Why: as scheduled Contact information: [address] [address] [phone]          Allergies  Allergen Reactions  . Shellfish Allergy Swelling    Airway involvement including EMS - Has Epi-Pen  . Metformin And Related Diarrhea    Procedures/Studies: Dg Chest 2 View  Result Date: 02/19/2019 CLINICAL DATA:  Shortness of breath EXAM: CHEST - 2 VIEW COMPARISON:  February 06, 2019 FINDINGS: Lungs are clear. Heart is enlarged with pulmonary vascularity normal. No adenopathy. No bone lesions. IMPRESSION: Cardiomegaly.  No edema or consolidation. Electronically Signed   By: Lowella Grip III M.D.   On: 02/19/2019 17:12   Dg Chest Port 1 View  Result Date: 02/06/2019 CLINICAL DATA:  Diabetic ketoacidosis EXAM: PORTABLE CHEST 1 VIEW COMPARISON:  February 04, 2016 FINDINGS: There is no edema or consolidation. There is mild atelectatic change in the left mid lung. There is cardiomegaly with pulmonary vascularity within normal limits. No adenopathy. No bone lesions. IMPRESSION: Atelectatic change left mid lung. No  edema or consolidation. Stable cardiac enlargement. No adenopathy. Electronically Signed   By: Lowella Grip III M.D.   On: 02/06/2019 16:02     Subjective: Eager to go home  Discharge Exam: Vitals:   02/25/19 0443 02/25/19 0937  BP: 99/75 112/88   Pulse: 91 90  Resp: 18   Temp: 97.7 F (36.5 C)   SpO2: 100%    Vitals:   02/24/19 1553 02/24/19 2110 02/25/19 0443 02/25/19 0937  BP: 110/90 105/82 99/75 112/88  Pulse: 87 88 91 90  Resp: _0 Temp: 97.9 F (36.6 C) 98 F (36.7 C) 97.7 F (36.5 C)   TempSrc: Oral Oral Oral   SpO2: 100% 98% 100%   Weight:   95.5 kg   Height:        General: Pt is alert, awake, not in acute distress Cardiovascular: RRR, S1/S2 +, no rubs, no gallops Respiratory: CTA bilaterally, no wheezing, no rhonchi Abdominal: Soft, NT, ND, bowel sounds + Extremities: no edema, no cyanosis   The results of significant diagnostics from this hospitalization (including imaging, microbiology, ancillary and laboratory) are listed below for reference.     Microbiology: Recent Results (from the past 240 hour(s))  Urine culture     Status: Abnormal   Collection Time: 02/19/19  8:58 PM   Specimen: Nasopharyngeal Swab; Urine  Result Value Ref Range Status   Specimen Description   Final    Urine Performed at [facility] [address] [place], [place] [address]    Special Requests   Final    NONE Performed at [facility], [place] [address], [place], [place] [address]    Culture MULTIPLE SPECIES PRESENT, SUGGEST RECOLLECTION (A)  Final   Report Status 02/20/2019 FINAL  Final  SARS Coronavirus 2 (CEPHEID- Performed in [facility] hospital lab), Hosp Order     Status: None   Collection Time: 02/19/19  8:59 PM   Specimen: Nasopharyngeal Swab  Result Value Ref Range Status   SARS Coronavirus 2 NEGATIVE NEGATIVE Final    Comment: (NOTE) If result is NEGATIVE SARS-CoV-2 target nucleic acids are NOT DETECTED. The SARS-CoV-2 RNA is generally detectable in upper and lower  respiratory specimens during the acute phase of infection. The lowest  concentration of SARS-CoV-2 viral copies this assay can detect is 250  copies / mL. A negative result does not preclude  SARS-CoV-2 infection  and should not be used as the sole basis for treatment or other  patient management decisions.  A negative result may occur with  improper specimen collection / handling, submission of specimen other  than nasopharyngeal swab, presence of viral mutation(s) within the  areas targeted by this assay, and inadequate number of viral copies  (<250 copies / mL). A negative result must be combined with clinical  observations, patient history, and epidemiological information. If result is POSITIVE SARS-CoV-2 target nucleic acids are DETECTED. The SARS-CoV-2 RNA is generally detectable in upper and lower  respiratory specimens dur ing the acute phase of infection.  Positive  results are indicative of active infection with SARS-CoV-2.  Clinical  correlation with patient history and other diagnostic information is  necessary to determine patient infection status.  Positive results do  not rule out bacterial infection or co-infection with other viruses. If result is PRESUMPTIVE POSTIVE SARS-CoV-2 nucleic acids MAY BE PRESENT.   A presumptive positive result was obtained on the submitted specimen  and confirmed on repeat testing.  While 2019 novel  coronavirus  (SARS-CoV-2) nucleic acids may be present in the submitted sample  additional confirmatory testing may be necessary for epidemiological  and / or clinical management purposes  to differentiate between  SARS-CoV-2 and other Sarbecovirus currently known to infect humans.  If clinically indicated additional testing with an alternate test  methodology [phone]) is advised. The SARS-CoV-2 RNA is generally  detectable in upper and lower respiratory sp ecimens during the acute  phase of infection. The expected result is Negative. Fact Sheet for Patients:  StrictlyIdeas.no Fact Sheet for Healthcare Providers: BankingDealers.co.za This test is not yet approved or cleared by the  [place] FDA and has been authorized for detection and/or diagnosis of SARS-CoV-2 by FDA under an Emergency Use Authorization (EUA).  This EUA will remain in effect (meaning this test can be used) for the duration of the COVID-19 declaration under Section 564(b)(1) of the Act, 21 U.S.C. section 360bbb-3(b)(1), unless the authorization is terminated or revoked sooner. Performed at [facility], [place] [address] [place], [place] [address]      Labs: BNP (last 3 results) Recent Labs    12/25/18 1026 02/19/19 2026  BNP 401.2* 811.9*   Basic Metabolic Panel: Recent Labs  Lab 02/20/19 0501 02/21/19 0500 02/22/19 0442 02/23/19 0606 02/24/19 0542 02/24/19 0559 02/25/19 0432  NA  --  133* 133* 135 135  --  136  K  --  4.3 3.5 3.2* 3.7  --  4.2  CL  --  100 96* 97* 96*  --  97*  CO2  --  _0 --  27  GLUCOSE  --  148* 150* 110* 134*  --  147*  BUN  --  20 21* 28* 33*  --  38*  CREATININE  --  1.01* 0.98 1.06* 1.21*  --  1.52*  CALCIUM  --  8.5* 8.5* 8.5* 8.5*  --  8.5*  MG 1.6*  --   --   --   --  1.7  --    Liver Function Tests: Recent Labs  Lab 02/19/19 2026  AST 39  ALT 26  ALKPHOS 162*  BILITOT 2.0*  PROT 7.2  ALBUMIN 2.5*   No results for input(s): LIPASE, AMYLASE in the last 168 hours. No results for input(s): AMMONIA in the last 168 hours. CBC: Recent Labs  Lab 02/20/19 0501  WBC 9.4  HGB 11.2*  HCT 37.6  MCV 69.9*  PLT 301   Cardiac Enzymes: No results for input(s): CKTOTAL, CKMB, CKMBINDEX, TROPONINI in the last 168 hours. BNP: Invalid input(s): POCBNP CBG: Recent Labs  Lab 02/24/19 0745 02/24/19 1404 02/24/19 1650 02/24/19 2108 02/25/19 0730  GLUCAP 106* 98 152* 217* 142*   D-Dimer No results for input(s): DDIMER in the last 72 hours. Hgb A1c No results for input(s): HGBA1C in the last 72 hours. Lipid Profile No results for input(s): CHOL, HDL, LDLCALC, TRIG, CHOLHDL, LDLDIRECT in the last 72  hours. Thyroid function studies No results for input(s): TSH, T4TOTAL, T3FREE, THYROIDAB in the last 72 hours.  Invalid input(s): FREET3 Anemia work up No results for input(s): VITAMINB12, FOLATE, FERRITIN, TIBC, IRON, RETICCTPCT in the last 72 hours. Urinalysis    Component Value Date/Time   COLORURINE AMBER (A) 02/19/2019 2058   APPEARANCEUR HAZY (A) 02/19/2019 2058   LABSPEC 1.038 (H) 02/19/2019 2058   PHURINE 5.0 02/19/2019 2058   GLUCOSEU 150 (A) 02/19/2019 2058   HGBUR NEGATIVE 02/19/2019 2058   BILIRUBINUR SMALL (A) 02/19/2019 2058  BILIRUBINUR NEG 04/05/2013 1057   KETONESUR NEGATIVE 02/19/2019 2058   PROTEINUR >=300 (A) 02/19/2019 2058   UROBILINOGEN 0.2 08/09/2013 2031   NITRITE NEGATIVE 02/19/2019 2058   LEUKOCYTESUR NEGATIVE 02/19/2019 2058   Sepsis Labs Invalid input(s): PROCALCITONIN,  WBC,  LACTICIDVEN Microbiology Recent Results (from the past 240 hour(s))  Urine culture     Status: Abnormal   Collection Time: 02/19/19  8:58 PM   Specimen: Nasopharyngeal Swab; Urine  Result Value Ref Range Status   Specimen Description   Final    Urine Performed at [facility], [place] [address] [place], [place] [address]    Special Requests   Final    NONE Performed at [facility], [place] [address], [place], [place] [address]    Culture MULTIPLE SPECIES PRESENT, SUGGEST RECOLLECTION (A)  Final   Report Status 02/20/2019 FINAL  Final  SARS Coronavirus 2 (CEPHEID- Performed in [place] hospital lab), Hosp Order     Status: None   Collection Time: 02/19/19  8:59 PM   Specimen: Nasopharyngeal Swab  Result Value Ref Range Status   SARS Coronavirus 2 NEGATIVE NEGATIVE Final    Comment: (NOTE) If result is NEGATIVE SARS-CoV-2 target nucleic acids are NOT DETECTED. The SARS-CoV-2 RNA is generally detectable in upper and lower  respiratory specimens during the acute phase of infection. The lowest  concentration of SARS-CoV-2 viral  copies this assay can detect is 250  copies / mL. A negative result does not preclude SARS-CoV-2 infection  and should not be used as the sole basis for treatment or other  patient management decisions.  A negative result may occur with  improper specimen collection / handling, submission of specimen other  than nasopharyngeal swab, presence of viral mutation(s) within the  areas targeted by this assay, and inadequate number of viral copies  (<250 copies / mL). A negative result must be combined with clinical  observations, patient history, and epidemiological information. If result is POSITIVE SARS-CoV-2 target nucleic acids are DETECTED. The SARS-CoV-2 RNA is generally detectable in upper and lower  respiratory specimens dur ing the acute phase of infection.  Positive  results are indicative of active infection with SARS-CoV-2.  Clinical  correlation with patient history and other diagnostic information is  necessary to determine patient infection status.  Positive results do  not rule out bacterial infection or co-infection with other viruses. If result is PRESUMPTIVE POSTIVE SARS-CoV-2 nucleic acids MAY BE PRESENT.   A presumptive positive result was obtained on the submitted specimen  and confirmed on repeat testing.  While 2019 novel coronavirus  (SARS-CoV-2) nucleic acids may be present in the submitted sample  additional confirmatory testing may be necessary for epidemiological  and / or clinical management purposes  to differentiate between  SARS-CoV-2 and other Sarbecovirus currently known to infect humans.  If clinically indicated additional testing with an alternate test  methodology [phone]) is advised. The SARS-CoV-2 RNA is generally  detectable in upper and lower respiratory sp ecimens during the acute  phase of infection. The expected result is Negative. Fact Sheet for Patients:  StrictlyIdeas.no Fact Sheet for Healthcare  Providers: BankingDealers.co.za This test is not yet approved or cleared by the [place] FDA and has been authorized for detection and/or diagnosis of SARS-CoV-2 by FDA under an Emergency Use Authorization (EUA).  This EUA will remain in effect (meaning this test can be used) for the duration of the COVID-19 declaration under Section 564(b)(1) of the Act, 21 U.S.C.  section 360bbb-3(b)(1), unless the authorization is terminated or revoked sooner. Performed at [facility], [place] [address] [place], [place] [address]    Time spent: 30 min  SIGNED:   Marylu Lund, MD  [facility] 02/25/2019, 9:45 AM  If 7PM-7AM, please contact night-coverage

## 2019-02-27 ENCOUNTER — Ambulatory Visit: Payer: Medicaid Other | Admitting: Family Medicine

## 2019-03-01 ENCOUNTER — Inpatient Hospital Stay: Payer: Medicaid Other | Admitting: Nurse Practitioner

## 2019-03-08 ENCOUNTER — Other Ambulatory Visit: Payer: Self-pay

## 2019-03-08 ENCOUNTER — Ambulatory Visit (INDEPENDENT_AMBULATORY_CARE_PROVIDER_SITE_OTHER): Payer: Self-pay | Admitting: Family Medicine

## 2019-03-08 VITALS — BP 128/100 | HR 102 | Wt 211.4 lb

## 2019-03-08 DIAGNOSIS — M7918 Myalgia, other site: Secondary | ICD-10-CM

## 2019-03-08 DIAGNOSIS — Z789 Other specified health status: Secondary | ICD-10-CM

## 2019-03-08 DIAGNOSIS — E119 Type 2 diabetes mellitus without complications: Secondary | ICD-10-CM

## 2019-03-08 NOTE — Progress Notes (Signed)
Subjective:    [name] - 43 y.o. female MRN [id number]  Date of birth: 1975-12-02  CC:  [name] is here for follow-up of diabetes.  She would also like to discuss her musculoskeletal pain.  HPI: Type 2 diabetes Medications: Lantus 20 units each morning, cannot tolerate metformin, Medicaid with family planning prevents her from affording other medications, ACE inhibitor and statin were discontinued at recent visit due to desire to conceive Blood sugars: low to mid 200s fasting, did not bring meter Hypoglycemic symptoms: nausea, but no low blood sugars recorded Hyperglycemic symptoms: no  Desire to conceive Patient says that she wants to conceive because her current boyfriend does not have any children.  She has 2 girls and would like a boy.  Musculoskeletal pain Complains of chest wall pain and lower back pain that are worse at night.  This is chronic.  She says that what ever they gave her in her IV in the hospital was much more effective than the Tylenol she is taking at home.  She would like something for her pain today.  Health Maintenance:  Health Maintenance Due  Topic Date Due  . PNEUMOCOCCAL POLYSACCHARIDE VACCINE AGE 45-64 HIGH RISK  11/23/1979  . FOOT EXAM  11/23/1987  . OPHTHALMOLOGY EXAM  11/23/1987  . TETANUS/TDAP  11/22/1996  . PAP SMEAR-Modifier  02/03/2015  . URINE MICROALBUMIN  10/21/2018  . INFLUENZA VACCINE  02/23/2019    -  reports that she has never smoked. She has never used smokeless tobacco. - Review of Systems: Per HPI. - Past Medical History: Patient Active Problem List   Diagnosis Date Noted  . Attempting to conceive 03/10/2019  . Musculoskeletal pain 03/10/2019  . Acute on chronic HFrEF (heart failure with reduced ejection fraction) ([place]) 02/19/2019  . Asthma   . Hyperglycemia   . Yeast infection 02/06/2019  . Right ventricular failure ([place]) 02/06/2019  . Morbid obesity ([place]) 02/06/2019  . DKA, type 2 ([place]) 02/06/2019  . HFrEF  (heart failure with reduced ejection fraction) ([place]) 12/31/2018  . Paroxysmal atrial fibrillation ([place]) 12/31/2018  . Anxiety 12/31/2018  . Nausea 12/27/2018  . Hyperlipidemia associated with type 2 diabetes mellitus ([place]) 10/30/2017  . Obstructive sleep apnea 06/30/2015  . Shortness of breath 06/29/2015  . Missed period 04/05/2013  . Anemia due to blood loss, chronic 06/27/2012  . Allergic rhinitis 06/27/2012  . Major depressive disorder 02/08/2012  . Hypertension associated with diabetes ([place]) 12/04/2011  . Dysmenorrhea 11/22/2011  . Type 2 diabetes mellitus ([place]) 05/19/2011  . PCOS (polycystic ovarian syndrome) 05/19/2011  . Hydrosalpinx 08/21/2009   - Medications: reviewed and updated   Objective:   Physical Exam BP (!) 128/100   Pulse (!) 102   Wt 211 lb 6.4 oz (95.9 kg)   LMP 11/23/2018 (Approximate)   SpO2 98%   BMI 45.75 kg/m  Gen: NAD, alert, cooperative with exam, obese, appears tired and withdrawn  Psych: Poor insight into current medical conditions, alert and oriented     Assessment & Plan:   Type 2 diabetes mellitus ([place]) Poorly controlled according to recent A1c of >15.5.  Counseled patient that she would likely benefit from increasing her Lantus, but she does not wish to do so currently.  Patient would also benefit from additional medication such as an SGLT2 or GLP-1 due to her concurrent heart failure, but cost is an issue.  Would like patient to follow-up in about 2 months to recheck her hemoglobin A1c and to alter  her diabetes medications.  Attempting to conceive Counseled patient extensively that a pregnancy would endanger her life and possibly her baby's life due to her heart failure, atrial fibrillation, and very poorly controlled type 2 diabetes.  Also told her that she is missing the benefits of 2 medications since they were stopped due to her desire to conceive.  Encouraged her to gain better control of her health before attempting to conceive.  Difficult  to know whether patient was receptive to this counsel or not.  Encouraged patient to let us know if she has changed her mind on attempting to conceive, and we can restart her statin and ACE inhibitor.  Musculoskeletal pain Counseled patient that I do not feel that it is good care to prescribe narcotic medications for her pain given their long list of side effects, habit-forming potential, and the medication burden she already has.  Recommended that she try Voltaren gel OTC for her musculoskeletal pain.    Maia Breslow, M.D. 03/10/2019, 9:54 AM PGY-3, [place]

## 2019-03-08 NOTE — Patient Instructions (Addendum)
It was nice meeting you today [name]!  Please try Voltaren gel for your pain.  You can buy this over-the-counter and it should be affordable.  I would like for you to return in 2 months to have your hemoglobin A1c checked.   Please continue to check your blood sugars.  Please also work on reducing intake of carbohydrates and sweet foods and to try to get some activity every day.  Please also consider postponing your wish to conceive until you are in a better place with your health  Being pregnant with your heart problems and your diabetes could be dangerous to both you and a baby.  If you have any questions or concerns, please feel free to call the clinic.   Be well,  Dr. Shan Levans

## 2019-03-10 DIAGNOSIS — M7918 Myalgia, other site: Secondary | ICD-10-CM | POA: Insufficient documentation

## 2019-03-10 DIAGNOSIS — Z789 Other specified health status: Secondary | ICD-10-CM | POA: Insufficient documentation

## 2019-03-10 NOTE — Assessment & Plan Note (Signed)
Counseled patient extensively that a pregnancy would endanger her life and possibly her baby's life due to her heart failure, atrial fibrillation, and very poorly controlled type 2 diabetes.  Also told her that she is missing the benefits of 2 medications since they were stopped due to her desire to conceive.  Encouraged her to gain better control of her health before attempting to conceive.  Difficult to know whether patient was receptive to this counsel or not.  Encouraged patient to let us know if she has changed her mind on attempting to conceive, and we can restart her statin and ACE inhibitor.

## 2019-03-10 NOTE — Assessment & Plan Note (Signed)
Poorly controlled according to recent A1c of >15.5.  Counseled patient that she would likely benefit from increasing her Lantus, but she does not wish to do so currently.  Patient would also benefit from additional medication such as an SGLT2 or GLP-1 due to her concurrent heart failure, but cost is an issue.  Would like patient to follow-up in about 2 months to recheck her hemoglobin A1c and to alter her diabetes medications.

## 2019-03-10 NOTE — Assessment & Plan Note (Signed)
Counseled patient that I do not feel that it is good care to prescribe narcotic medications for her pain given their long list of side effects, habit-forming potential, and the medication burden she already has.  Recommended that she try Voltaren gel OTC for her musculoskeletal pain.

## 2019-03-12 ENCOUNTER — Telehealth: Payer: Self-pay | Admitting: *Deleted

## 2019-03-12 NOTE — Telephone Encounter (Signed)
Pt calls because she picked up the voltaren gel and it has "not helped at all".  States that she cant get any sleep.  She is reluctantly agreeable to appt with pcp tomorrow but would like me to send message to Dr. Shan Levans to see if anything can be done without an appt. Christen Bame, CMA

## 2019-03-13 ENCOUNTER — Other Ambulatory Visit: Payer: Self-pay

## 2019-03-13 ENCOUNTER — Encounter: Payer: Self-pay | Admitting: Family Medicine

## 2019-03-13 ENCOUNTER — Other Ambulatory Visit: Payer: Self-pay | Admitting: Family Medicine

## 2019-03-13 ENCOUNTER — Ambulatory Visit (INDEPENDENT_AMBULATORY_CARE_PROVIDER_SITE_OTHER): Payer: Self-pay | Admitting: Family Medicine

## 2019-03-13 ENCOUNTER — Telehealth: Payer: Self-pay | Admitting: Family Medicine

## 2019-03-13 VITALS — BP 118/96 | HR 98 | Wt 214.8 lb

## 2019-03-13 DIAGNOSIS — F419 Anxiety disorder, unspecified: Secondary | ICD-10-CM

## 2019-03-13 DIAGNOSIS — E119 Type 2 diabetes mellitus without complications: Secondary | ICD-10-CM

## 2019-03-13 DIAGNOSIS — I5020 Unspecified systolic (congestive) heart failure: Secondary | ICD-10-CM

## 2019-03-13 LAB — POCT UA - MICROALBUMIN
Albumin/Creatinine Ratio, Urine, POC: >300
Creatinine, POC: 100 mg/dL
Microalbumin Ur, POC: 150 mg/L

## 2019-03-13 MED ORDER — TRAZODONE HCL 50 MG PO TABS: 50.0000 mg | ORAL_TABLET | Freq: Every evening | ORAL | 0 refills | Status: DC | PRN

## 2019-03-13 MED ORDER — HYDROXYZINE HCL 10 MG PO TABS: 10.0000 mg | ORAL_TABLET | Freq: Three times a day (TID) | ORAL | 0 refills | Status: AC | PRN

## 2019-03-13 MED ORDER — FUROSEMIDE 40 MG PO TABS: 80.0000 mg | ORAL_TABLET | Freq: Two times a day (BID) | ORAL | 0 refills | Status: DC

## 2019-03-13 NOTE — Patient Instructions (Signed)
It was a pleasure to see you today! Thank you for choosing [facility] for your primary care. [name] was seen for CHF and diabetes. Come back to the clinic in a week.   Today we talked about the fact that the furosemide 80 was going to be a new dose because the 40 mg was not always making her urinate.  You will take the furosemide 80- 2 times per day, these need to be at least 6 hours apart and you will find you sleep better if you do not take one right before you go to bed.  Please weigh yourself daily and remember that your dry weight is supposed to be around 210 pounds  We talked about how you been taking the Lantus 20 every day.  Please start writing down your morning blood sugars and bring in the book on your next visit so that we can adjust this dose appropriately.  He said that you been in the mid 200s which we find to be good and an improvement from the past.  If you start getting readings in the low 100s or under 100 please pause on your Lantus and call us.  We also refilled your Atarax for anxiety, we think some of this will calm down once your fluid status is appropriate and you can breathe better.  Please come back to see [place] in a week   Please bring all your medications to every doctors visit   Sign up for My Chart to have easy access to your labs results, and communication with your Primary care physician.     Please check-out at the front desk before leaving the clinic.     Best,  Dr. [name] FAMILY MEDICINE RESIDENT - PGY3 03/13/2019 4:59 PM

## 2019-03-13 NOTE — Telephone Encounter (Signed)
Left VM stating that I will send in trazodone 50 mg nightly as needed to help with her sleep, but if she wants to talk about management of her pain, she should keep her appointment with Dr. Criss Rosales this afternoon.  I also suggested trying melatonin OTC to see if this helps her as well.  I have sent in trazodone to her [place].  Thanks.

## 2019-03-13 NOTE — Progress Notes (Signed)
    Subjective:  [name] is a 43 y.o. female who presents to the [facility] today with a chief complaint of CHF/fluid status.   HPI: HFrEF (heart failure with reduced ejection fraction) ([place]) Patient with inconsistent urination response on 40mg  BID.  Has been taking one dose right when she wakes up and the other right before bed.  Knows EDW is 210lbs  DM2 (diabetes mellitus, type 2) ([place]) Patient has been checking blood sugars every few days at home, doesn't remember exact readings and doesn't write them down but they tend to be mid 200s to upper 100s she says.  She does say firmly that she consistently takes the lantus 20 when she wakes up.  She says she has been urinating less since leaving hospital   Anxiety Chronic, stable.  I think the SOB associated with CHF may be contributing to this.  Objective:  Physical Exam: BP (!) 118/96   Pulse 98   Wt 214 lb 12.8 oz (97.4 kg)   SpO2 100%   BMI 46.48 kg/m   Gen: NAD, conversing comfortably CV: RRR with no murmurs appreciated Pulm: NWOB at rest, CTAB with w/ minimal crackles, no wheezes/rhonchi.  Does get winded when walking across clinic GI: Normal bowel sounds present. Soft, Nontender, Nondistended. MSK: no edema, cyanosis, or clubbing noted. 2+ edema on legs bilaterally, no skin changes/erythema Skin: warm, dry Neuro: grossly normal, moves all extremities Psych: Normal affect and thought content  Results for orders placed or performed in visit on 03/13/19 (from the past 72 hour(s))  POCT UA - Microalbumin     Status: Abnormal   Collection Time: 03/13/19  4:35 PM  Result Value Ref Range   Microalbumin Ur, POC 150 mg/L   Creatinine, POC 100 mg/dL   Albumin/Creatinine Ratio, Urine, POC >300      Assessment/Plan:  DM2 (diabetes mellitus, type 2) ([place]) Patient has been checking blood sugars every few days at home, doesn't remember exact readings and doesn't write them down but they tend to be mid 200s to upper 100s she says.   She does say firmly that she consistently takes the lantus 20 when she wakes up.  She says she has been urinating less since leaving hospital which could be due to improvement of hyperglycemia.  Asked to starting charting daily labs so we can safely titrate medicines and f/u in a week.  Anxiety Chronic, stable.  I think the SOB associated with CHF may be contributing to this.  Refill chronic atarax  HFrEF (heart failure with reduced ejection fraction) ([place]) Patient with inconsistent urination response on 40mg  BID.  Has been taking one dose right when she wakes up and the other right be fore bed.    Discussed changing timing to 1 at waking and one midday (at least 6 hours later).  She is also being increased to 80BID.  Instructed to start weighing herself daily.   Knows EDW is 210lbs  Will recheck BMP/evaluate weight in a week   [name], [place] - PGY3 03/14/2019 4:31 PM

## 2019-03-14 ENCOUNTER — Telehealth: Payer: Self-pay | Admitting: *Deleted

## 2019-03-14 NOTE — Telephone Encounter (Signed)
-----   Message from [name], DO sent at 03/14/2019  4:18 PM EDT ----- Please schedule f/u in 1-3 weeks with me, reason "DM/CHF"

## 2019-03-14 NOTE — Assessment & Plan Note (Signed)
Chronic, stable.  I think the SOB associated with CHF may be contributing to this.  Refill chronic atarax

## 2019-03-14 NOTE — Assessment & Plan Note (Signed)
Patient has been checking blood sugars every few days at home, doesn't remember exact readings and doesn't write them down but they tend to be mid 200s to upper 100s she says.  She does say firmly that she consistently takes the lantus 20 when she wakes up.  She says she has been urinating less since leaving hospital which could be due to improvement of hyperglycemia.  Asked to starting charting daily labs so we can safely titrate medicines and f/u in a week.

## 2019-03-14 NOTE — Assessment & Plan Note (Signed)
Patient with inconsistent urination response on 40mg  BID.  Has been taking one dose right when she wakes up and the other right be fore bed.    Discussed changing timing to 1 at waking and one midday (at least 6 hours later).  She is also being increased to 80BID.  Instructed to start weighing herself daily.   Knows EDW is 210lbs  Will recheck BMP/evaluate weight in a week

## 2019-03-15 NOTE — Telephone Encounter (Signed)
LVM to call office to assist her in scheduling an appointment per Dr. Criss Rosales, see below.[name], CMA

## 2019-03-20 NOTE — Telephone Encounter (Signed)
LVM for pt to call office to inform her of this message by Dr. Criss Rosales. [name], CMA   [name], DO  [facility] 18 minutes ago (10:49 AM)   Please call patient, "Dr. Criss Rosales thinks you might benefit from a telemedicine visit with any of our doctors here or with the cardiologist to discuss your weights (we discussed weighing and writing them down daily) and urinary response to your doses of diuretic if you think your breathing when you lay down is too bad to wait until your appt with him"    Message text

## 2019-03-20 NOTE — Telephone Encounter (Signed)
Contacted pt and scheduled her an appointment on 03/27/2019 @ 11:00am with PCP. While on phone she wanted to let PCP know that the pain is getting worse and that she is not sleeping and that it is getting so bad that she may have to go to the hospital.  Asked if she wanted to be seen before appt and she said he knows what kind of things are going on with me.  I told her we would keep scheduled appt and if he wanted her to come in before then we would call and let her know. Dayona Shaheen Zimmerman Rumple, CMA

## 2019-03-22 NOTE — Telephone Encounter (Signed)
Contacted pt and gave her the below message, she said she will try to hold out for her appointment on 03/27/2019 with PCP. Told her if anything changes over the weekend she should go to urgent care or ED.  And if she feels like she needs to see someone in the office before then to call and we would try and get her in on Monday or Tuesday. April Zimmerman Rumple, CMA

## 2019-03-27 ENCOUNTER — Ambulatory Visit (INDEPENDENT_AMBULATORY_CARE_PROVIDER_SITE_OTHER): Payer: Self-pay | Admitting: Family Medicine

## 2019-03-27 ENCOUNTER — Encounter: Payer: Self-pay | Admitting: Family Medicine

## 2019-03-27 ENCOUNTER — Encounter (HOSPITAL_COMMUNITY): Payer: Self-pay | Admitting: Emergency Medicine

## 2019-03-27 ENCOUNTER — Inpatient Hospital Stay (HOSPITAL_COMMUNITY)
Admission: EM | Admit: 2019-03-27 | Discharge: 2019-04-02 | DRG: 287 | Disposition: A | Discharge status: Home Health | Payer: Medicaid Other | Attending: Family Medicine | Admitting: Family Medicine

## 2019-03-27 ENCOUNTER — Other Ambulatory Visit: Payer: Self-pay

## 2019-03-27 ENCOUNTER — Emergency Department (HOSPITAL_COMMUNITY): Payer: Self-pay

## 2019-03-27 VITALS — BP 124/98 | HR 97 | Wt 225.6 lb

## 2019-03-27 DIAGNOSIS — Z79899 Other long term (current) drug therapy: Secondary | ICD-10-CM

## 2019-03-27 DIAGNOSIS — Z794 Long term (current) use of insulin: Secondary | ICD-10-CM

## 2019-03-27 DIAGNOSIS — J9811 Atelectasis: Secondary | ICD-10-CM | POA: Diagnosis present

## 2019-03-27 DIAGNOSIS — Z66 Do not resuscitate: Secondary | ICD-10-CM | POA: Diagnosis present

## 2019-03-27 DIAGNOSIS — I5023 Acute on chronic systolic (congestive) heart failure: Secondary | ICD-10-CM

## 2019-03-27 DIAGNOSIS — I110 Hypertensive heart disease with heart failure: Principal | ICD-10-CM | POA: Diagnosis present

## 2019-03-27 DIAGNOSIS — I480 Paroxysmal atrial fibrillation: Secondary | ICD-10-CM | POA: Diagnosis present

## 2019-03-27 DIAGNOSIS — F329 Major depressive disorder, single episode, unspecified: Secondary | ICD-10-CM | POA: Diagnosis present

## 2019-03-27 DIAGNOSIS — R079 Chest pain, unspecified: Secondary | ICD-10-CM | POA: Insufficient documentation

## 2019-03-27 DIAGNOSIS — G8929 Other chronic pain: Secondary | ICD-10-CM | POA: Diagnosis present

## 2019-03-27 DIAGNOSIS — N179 Acute kidney failure, unspecified: Secondary | ICD-10-CM | POA: Diagnosis present

## 2019-03-27 DIAGNOSIS — I428 Other cardiomyopathies: Secondary | ICD-10-CM | POA: Diagnosis present

## 2019-03-27 DIAGNOSIS — G4733 Obstructive sleep apnea (adult) (pediatric): Secondary | ICD-10-CM | POA: Diagnosis present

## 2019-03-27 DIAGNOSIS — I959 Hypotension, unspecified: Secondary | ICD-10-CM | POA: Diagnosis not present

## 2019-03-27 DIAGNOSIS — F419 Anxiety disorder, unspecified: Secondary | ICD-10-CM | POA: Diagnosis present

## 2019-03-27 DIAGNOSIS — I509 Heart failure, unspecified: Secondary | ICD-10-CM

## 2019-03-27 DIAGNOSIS — E1165 Type 2 diabetes mellitus with hyperglycemia: Secondary | ICD-10-CM | POA: Diagnosis present

## 2019-03-27 DIAGNOSIS — J45909 Unspecified asthma, uncomplicated: Secondary | ICD-10-CM | POA: Diagnosis present

## 2019-03-27 DIAGNOSIS — Z8249 Family history of ischemic heart disease and other diseases of the circulatory system: Secondary | ICD-10-CM

## 2019-03-27 DIAGNOSIS — D509 Iron deficiency anemia, unspecified: Secondary | ICD-10-CM | POA: Diagnosis present

## 2019-03-27 DIAGNOSIS — Z833 Family history of diabetes mellitus: Secondary | ICD-10-CM

## 2019-03-27 DIAGNOSIS — I152 Hypertension secondary to endocrine disorders: Secondary | ICD-10-CM | POA: Diagnosis present

## 2019-03-27 DIAGNOSIS — E282 Polycystic ovarian syndrome: Secondary | ICD-10-CM | POA: Diagnosis present

## 2019-03-27 DIAGNOSIS — R109 Unspecified abdominal pain: Secondary | ICD-10-CM | POA: Diagnosis present

## 2019-03-27 DIAGNOSIS — E1159 Type 2 diabetes mellitus with other circulatory complications: Secondary | ICD-10-CM | POA: Diagnosis present

## 2019-03-27 DIAGNOSIS — E1140 Type 2 diabetes mellitus with diabetic neuropathy, unspecified: Secondary | ICD-10-CM | POA: Diagnosis present

## 2019-03-27 DIAGNOSIS — Z888 Allergy status to other drugs, medicaments and biological substances status: Secondary | ICD-10-CM

## 2019-03-27 DIAGNOSIS — E1169 Type 2 diabetes mellitus with other specified complication: Secondary | ICD-10-CM | POA: Diagnosis present

## 2019-03-27 DIAGNOSIS — I5022 Chronic systolic (congestive) heart failure: Secondary | ICD-10-CM

## 2019-03-27 DIAGNOSIS — Z20828 Contact with and (suspected) exposure to other viral communicable diseases: Secondary | ICD-10-CM | POA: Diagnosis present

## 2019-03-27 DIAGNOSIS — Z6841 Body Mass Index (BMI) 40.0 and over, adult: Secondary | ICD-10-CM

## 2019-03-27 DIAGNOSIS — E785 Hyperlipidemia, unspecified: Secondary | ICD-10-CM | POA: Diagnosis present

## 2019-03-27 DIAGNOSIS — I5041 Acute combined systolic (congestive) and diastolic (congestive) heart failure: Secondary | ICD-10-CM

## 2019-03-27 DIAGNOSIS — Z91013 Allergy to seafood: Secondary | ICD-10-CM

## 2019-03-27 DIAGNOSIS — I513 Intracardiac thrombosis, not elsewhere classified: Secondary | ICD-10-CM | POA: Diagnosis present

## 2019-03-27 DIAGNOSIS — R0602 Shortness of breath: Secondary | ICD-10-CM | POA: Diagnosis present

## 2019-03-27 DIAGNOSIS — N920 Excessive and frequent menstruation with regular cycle: Secondary | ICD-10-CM | POA: Diagnosis present

## 2019-03-27 DIAGNOSIS — Z7901 Long term (current) use of anticoagulants: Secondary | ICD-10-CM

## 2019-03-27 DIAGNOSIS — I5082 Biventricular heart failure: Secondary | ICD-10-CM | POA: Diagnosis present

## 2019-03-27 DIAGNOSIS — I5020 Unspecified systolic (congestive) heart failure: Secondary | ICD-10-CM | POA: Diagnosis present

## 2019-03-27 DIAGNOSIS — I2510 Atherosclerotic heart disease of native coronary artery without angina pectoris: Secondary | ICD-10-CM | POA: Diagnosis present

## 2019-03-27 DIAGNOSIS — I5043 Acute on chronic combined systolic (congestive) and diastolic (congestive) heart failure: Secondary | ICD-10-CM

## 2019-03-27 DIAGNOSIS — E119 Type 2 diabetes mellitus without complications: Secondary | ICD-10-CM

## 2019-03-27 DIAGNOSIS — I5042 Chronic combined systolic (congestive) and diastolic (congestive) heart failure: Secondary | ICD-10-CM | POA: Diagnosis present

## 2019-03-27 HISTORY — DX: Unspecified systolic (congestive) heart failure: I50.20

## 2019-03-27 HISTORY — DX: Unspecified atrial fibrillation: I48.91

## 2019-03-27 LAB — CBG MONITORING, ED: Glucose-Capillary: 160 mg/dL — ABNORMAL HIGH (ref 70–99)

## 2019-03-27 LAB — BASIC METABOLIC PANEL
Anion gap: 12 (ref 5–15)
BUN: 19 mg/dL (ref 6–20)
CO2: 24 mmol/L (ref 22–32)
Calcium: 8.4 mg/dL — ABNORMAL LOW (ref 8.9–10.3)
Chloride: 101 mmol/L (ref 98–111)
Creatinine, Ser: 1.28 mg/dL — ABNORMAL HIGH (ref 0.44–1.00)
GFR calc Af Amer: >60 mL/min (ref >60)
GFR calc non Af Amer: 52 mL/min — ABNORMAL LOW (ref >60)
Glucose, Bld: 174 mg/dL — ABNORMAL HIGH (ref 70–99)
Potassium: 3.5 mmol/L (ref 3.5–5.1)
Sodium: 137 mmol/L (ref 135–145)

## 2019-03-27 LAB — CBC
HCT: 34.7 % — ABNORMAL LOW (ref 36.0–46.0)
Hemoglobin: 10.5 g/dL — ABNORMAL LOW (ref 12.0–15.0)
MCH: 20.5 pg — ABNORMAL LOW (ref 26.0–34.0)
MCHC: 30.3 g/dL (ref 30.0–36.0)
MCV: 67.6 fL — ABNORMAL LOW (ref 80.0–100.0)
Platelets: 276 10*3/uL (ref 150–400)
RBC: 5.13 MIL/uL — ABNORMAL HIGH (ref 3.87–5.11)
RDW: 21.9 % — ABNORMAL HIGH (ref 11.5–15.5)
WBC: 7.8 10*3/uL (ref 4.0–10.5)
nRBC: 0 % (ref 0.0–0.2)

## 2019-03-27 LAB — GLUCOSE, CAPILLARY: Glucose-Capillary: 99 mg/dL (ref 70–99)

## 2019-03-27 LAB — BRAIN NATRIURETIC PEPTIDE: B Natriuretic Peptide: 711.2 pg/mL — ABNORMAL HIGH (ref 0.0–100.0)

## 2019-03-27 LAB — TROPONIN I (HIGH SENSITIVITY)
Troponin I (High Sensitivity): 14 ng/L (ref <18)
Troponin I (High Sensitivity): 15 ng/L (ref <18)

## 2019-03-27 LAB — PREGNANCY, URINE: Preg Test, Ur: NEGATIVE

## 2019-03-27 LAB — SARS CORONAVIRUS 2 (TAT 6-24 HRS): SARS Coronavirus 2: NEGATIVE

## 2019-03-27 MED ORDER — FUROSEMIDE 10 MG/ML IJ SOLN
80.0000 mg | Freq: Once | INTRAMUSCULAR | Status: AC
  Administered 2019-03-27: 80 mg via INTRAVENOUS
  Filled 2019-03-27: qty 8

## 2019-03-27 MED ORDER — SODIUM CHLORIDE 0.9% FLUSH: 3.0000 mL | INTRAVENOUS | Status: DC | PRN

## 2019-03-27 MED ORDER — RIVAROXABAN 20 MG PO TABS
20.0000 mg | ORAL_TABLET | Freq: Every day | ORAL | Status: DC
  Administered 2019-03-27: 18:00:00 20 mg via ORAL
  Filled 2019-03-27 (×2): qty 1

## 2019-03-27 MED ORDER — TRAZODONE HCL 50 MG PO TABS
50.0000 mg | ORAL_TABLET | Freq: Every evening | ORAL | Status: DC | PRN
  Administered 2019-03-27 – 2019-03-31 (×2): 50 mg via ORAL
  Filled 2019-03-27 (×2): qty 1

## 2019-03-27 MED ORDER — RIVAROXABAN 20 MG PO TABS: 20.0000 mg | ORAL_TABLET | Freq: Every day | ORAL | 0 refills | Status: DC

## 2019-03-27 MED ORDER — AMLODIPINE BESYLATE 5 MG PO TABS
5.0000 mg | ORAL_TABLET | Freq: Every day | ORAL | Status: DC
  Administered 2019-03-27: 5 mg via ORAL
  Filled 2019-03-27: qty 1

## 2019-03-27 MED ORDER — CARVEDILOL 12.5 MG PO TABS
12.5000 mg | ORAL_TABLET | Freq: Two times a day (BID) | ORAL | Status: DC
  Administered 2019-03-27 – 2019-03-31 (×9): 12.5 mg via ORAL
  Filled 2019-03-27 (×11): qty 1

## 2019-03-27 MED ORDER — SODIUM CHLORIDE 0.9% FLUSH
3.0000 mL | Freq: Two times a day (BID) | INTRAVENOUS | Status: DC
  Administered 2019-03-27 – 2019-03-28 (×2): 3 mL via INTRAVENOUS

## 2019-03-27 MED ORDER — ACETAMINOPHEN 325 MG PO TABS
650.0000 mg | ORAL_TABLET | ORAL | Status: DC | PRN
  Administered 2019-03-27 – 2019-03-28 (×3): 650 mg via ORAL
  Filled 2019-03-27 (×3): qty 2

## 2019-03-27 MED ORDER — HYDROXYZINE HCL 10 MG PO TABS
10.0000 mg | ORAL_TABLET | Freq: Three times a day (TID) | ORAL | Status: DC | PRN
  Administered 2019-03-28: 10 mg via ORAL
  Filled 2019-03-27: qty 1

## 2019-03-27 MED ORDER — SODIUM CHLORIDE 0.9 % IV SOLN: 250.0000 mL | INTRAVENOUS | Status: DC | PRN

## 2019-03-27 MED ORDER — INSULIN ASPART 100 UNIT/ML ~~LOC~~ SOLN
0.0000 [IU] | Freq: Three times a day (TID) | SUBCUTANEOUS | Status: DC
  Administered 2019-03-28: 2 [IU] via SUBCUTANEOUS
  Administered 2019-03-30: 1 [IU] via SUBCUTANEOUS
  Administered 2019-03-30: 2 [IU] via SUBCUTANEOUS
  Administered 2019-03-31 (×3): 1 [IU] via SUBCUTANEOUS
  Administered 2019-04-01 (×3): 2 [IU] via SUBCUTANEOUS
  Administered 2019-04-02 (×2): 1 [IU] via SUBCUTANEOUS

## 2019-03-27 NOTE — Addendum Note (Signed)
Addended by: Owens Shark, Kerry Chisolm on: 03/27/2019 12:12 PM   Modules accepted: Level of Service

## 2019-03-27 NOTE — ED Notes (Signed)
Dinner tray ordered.

## 2019-03-27 NOTE — Assessment & Plan Note (Signed)
7 out of 10 chest pain with tightness, in a patient with significant uncontrolled diabetes as well as CHF with reduced ejection fraction to 20 to 25%.  Given aspirin 325, pulled EKG which showed some T wave abnormalities but no specific ST elevation, vital signs stable, EMS to transport to ED for ACS rule out

## 2019-03-27 NOTE — Plan of Care (Signed)

## 2019-03-27 NOTE — H&P (Addendum)
[facility] Admission History and Physical Service Pager: [phone]  Patient name: [name] Medical record number: [id number] Date of birth: 12-01-1975 Age: 43 y.o. Gender: female  Primary Care Provider: [name], DO Consultants: Cardiology Code Status: DNR Preferred Emergency Contact: [name], daughter, at [phone]  Chief Complaint: edema, shortness of breath, abdominal pain  Assessment and Plan: [name] is a 43 y.o. female presenting with shortness of breath . PMH is significant for HFrEF with EF 20-25%, atrial fibrillation on Xarelto, HTN, T2DM, asthma, and anxiety  Acute on chronic HFrEF: EF 20-25% echo June 2020 [name] presents with shortness of breath, chest pain, and swelling that started about four days ago.  She says that she had already begun to swell about two weeks ago, and the doctor increased her furosemide from 40 mg to 80 mg daily, which did not seem to help. The symptoms are likely to be due to acute on chronic HFrEF exacerbation due to her increased fluid status and increased BNP to 711 (654 1 month ago). Appears volume overloaded with pitting edema to mid thighs and distended abdomen.  Patient was obviously tachypneic on exam to the 30s, but could speak in full sentences.  On auscultation of her lungs she was clear bilaterally, with good air movement in all fields equally.  Chest x-ray in the emergency department reveals platelike atelectasis in the left mid lobe, but no infiltrate or pulmonary edema.  Concern that patient is not adherent to medication regimen and is consuming high quantities of sodium at home leading to this exacerbation.  EKG with normal sinus rhythm with T wave inversions likely indicating demand ischemia.  Troponin negative x 2.  An attempt was made to refer her to cardiology during her last admission, but she has not seen them in the outpatient setting.  We will consult cardiology during this  admission and greatly appreciate their recommendations. - admit to cardiac telemetry, Attending Dr. Owens Shark - continue Furosemide 80 mg PO BID - HOLD HCTZ, do not discharge on both HCTZ and furosemide - consider adding Entresto - STRICT I/Os - Fluid restriction 1,500 mL - monitor daily weights - continuous pulse ox - continue Amlodipine 5 mg PO qhs - continue Carvedilol 12.5 mg PO BID - consult cardiology in the morning - AM lab BMP  - urine pregnancy test negative - supplement electrolytes as needed  AKI Patient has a serum cr of 1.00, which is lower 1.52 at her admission 1 month ago. However, upon chart review, it appears that patient's baseline is closer to 0.8 when she is well. This could be a reflection of a resolving AKI from her last admission. We will monitor with daily labs.  - daily BMP - avoid nephrotoxic agents - hold HCTZ  Paroxysmal a fib  CHA2DS2-VASc score is at least 4, will continue home anticoagulation - continue rivaroxaban 20 mg daily - continue home carvedilol  DMT2 A1c>15.5 on 02/06/19.   - continue SSI, will add Lantus depending on CBGs   HTN Hypertensive on admission. Managed with amlodipine, HCTZ, and carvedilol. - continue amlodipine and carvedilol, hold HCTZ  OSA Pt does not use CPAP at home due to intolerance. May offer it to her if she prefers while in the hospital.  Asthma Patient is not actively wheezing. - may continue Ventolin 2 puff q6h PRN if needed  Anxiety Managed with hydroxyzine and trazodone. - continue home medications.  FEN/GI: heart healthy diet, fluid restriction 1,569m Prophylaxis: rivaroxaban 20  mg  Disposition: to home when her fluid status improves, resolving acute heart failure  History of Present Illness:  [name] is a 43 y.o. female presenting with shortness of breath, chest pain, and swelling that started about four days ago.  She says that she had already begun to swell about two weeks ago, and the doctor  increased her furosemide from 40 mg to 80 mg daily, which did not seem to help.  She says that she has trouble sleeping flat due to orthopnea, so she is frequently falling asleep during the day.  She is frustrated that she is taking nine medications and still not feeling well.  She will have certain days where she is more weak and short of breath than others, but she never feels completely normal.  She says that she will feel full and not want to eat frequently, going long stretches throughout the day without eating.  Will feel nauseated sometimes after eating.   She also reports frustration with her overall decline in health in the last year and feels very frustrated.  She was attempting to conceive a child with her boyfriend, necessitating several medication changes: entresto to amlodipine, and d/c of statin. She received counseling about risks of pregnancy at her age and with her comorbidities.  She states that she wants to be here for her children and grandchildren, but with her health being so troubled as of late, she wants to be DNR at this time. Resuscitation was explained to her, including the alternative possibility of death without intervention, and she still declined to be full code status.  We will address this decision again tomorrow, as she is only 43yo and was full code at her last admission 1 month ago.  She denies fevers, chills, headaches, vision changes, rhinorrhea, cough, diarrhea, constipation, and rashes.     Review Of Systems: Per HPI. She denies fevers, chills, headaches, vision changes, rhinorrhea, cough, diarrhea, constipation, and rashes.    Review of Systems  Constitutional: Negative for chills and fever.  HENT: Negative for congestion.   Eyes: Negative for blurred vision and double vision.  Respiratory: Negative for cough.   Cardiovascular: Positive for chest pain and leg swelling.  Gastrointestinal: Positive for abdominal pain (due to swelling) and nausea. Negative for  diarrhea and vomiting.  Neurological: Negative for headaches.  Psychiatric/Behavioral: The patient is nervous/anxious.     Patient Active Problem List   Diagnosis Date Noted  . Chest pain 03/27/2019  . Attempting to conceive 03/10/2019  . Musculoskeletal pain 03/10/2019  . Acute on chronic HFrEF (heart failure with reduced ejection fraction) ([place]) 02/19/2019  . Asthma   . Hyperglycemia   . Yeast infection 02/06/2019  . Right ventricular failure ([place]) 02/06/2019  . Morbid obesity ([place]) 02/06/2019  . DKA, type 2 ([place]) 02/06/2019  . HFrEF (heart failure with reduced ejection fraction) ([place]) 12/31/2018  . Paroxysmal atrial fibrillation ([place]) 12/31/2018  . Anxiety 12/31/2018  . Nausea 12/27/2018  . Hyperlipidemia associated with type 2 diabetes mellitus ([place]) 10/30/2017  . Obstructive sleep apnea 06/30/2015  . Shortness of breath 06/29/2015  . Missed period 04/05/2013  . Anemia due to blood loss, chronic 06/27/2012  . Allergic rhinitis 06/27/2012  . Major depressive disorder 02/08/2012  . Hypertension associated with diabetes ([place]) 12/04/2011  . Dysmenorrhea 11/22/2011  . DM2 (diabetes mellitus, type 2) ([place]) 05/19/2011  . PCOS (polycystic ovarian syndrome) 05/19/2011  . Hydrosalpinx 08/21/2009    Past Medical History: Past Medical History:  Diagnosis Date  .  Abnormal Pap smear   . Anemia   . Asthma   . Diabetes mellitus   . Hypertension   . 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    . Migraines   . Morbid obesity ([place]) 02/06/2019  . Obstructive sleep apnea 06/30/2015  . Polycystic ovarian disease     Past Surgical History: Past Surgical History:  Procedure Laterality Date  . CERVIX LESION DESTRUCTION    . CESAREAN SECTION      Social History: Social History   Tobacco Use  . Smoking status: Never Smoker  . Smokeless tobacco: Never Used  Substance Use  Topics  . Alcohol use: No    Alcohol/week: 0.0 standard drinks  . Drug use: No   Additional social history:   Please also refer to relevant sections of EMR.  Family History: Family History  Problem Relation Age of Onset  . Diabetes Mother   . Hypertension Mother   . Hypertension Father   . Diabetes Father    (If not completed, MUST add something in)  Allergies and Medications: Allergies  Allergen Reactions  . Shellfish Allergy Swelling    Airway involvement including EMS - Has Epi-Pen  . Metformin And Related Diarrhea   No current facility-administered medications on file prior to encounter.    Current Outpatient Medications on File Prior to Encounter  Medication Sig Dispense Refill  . ACCU-CHEK SOFTCLIX LANCETS lancets Check fasting blood sugar daily in the morning 100 each 12  . Accu-Chek Softclix Lancets lancets Use as instructed 100 each 12  . albuterol (VENTOLIN HFA) 108 (90 Base) MCG/ACT inhaler Inhale 2 puffs into the lungs every 6 (six) hours as needed for wheezing or shortness of breath. 18 g 1  . amLODipine (NORVASC) 5 MG tablet Take 1 tablet (5 mg total) by mouth at bedtime. 90 tablet 3  . Blood Glucose Monitoring Suppl (ACCU-CHEK AVIVA PLUS) w/Device KIT 1 application by Does not apply route QID. Check fasting sugars as well as throughout the day 1 kit 0  . carvedilol (COREG) 12.5 MG tablet Take 1 tablet (12.5 mg total) by mouth 2 (two) times daily. 60 tablet 1  . furosemide (LASIX) 40 MG tablet Take 2 tablets (80 mg total) by mouth 2 (two) times daily. 60 tablet 0  . glucose blood (ACCU-CHEK AVIVA PLUS) test strip Use as instructed 100 each 12  . glucose blood (AGAMATRIX PRESTO TEST) test strip Use as instructed 100 each 12  . hydrochlorothiazide (HYDRODIURIL) 25 MG tablet Take 1 tablet (25 mg total) by mouth daily. 90 tablet 3  . hydrOXYzine (ATARAX/VISTARIL) 10 MG tablet Take 1 tablet (10 mg total) by mouth 3 (three) times daily as needed for anxiety. 270 tablet  0  . insulin glargine (LANTUS) 100 UNIT/ML injection Inject 0.2 mLs (20 Units total) into the skin daily. 10 mL 0  . Lancets Misc. (ACCU-CHEK SOFTCLIX LANCET DEV) KIT 1 application by Does not apply route 4 (four) times daily. 1 kit 0  . rivaroxaban (XARELTO) 20 MG TABS tablet Take 1 tablet (20 mg total) by mouth daily with supper. 30 tablet 0  . traZODone (DESYREL) 50 MG tablet Take 1 tablet (50 mg total) by mouth at bedtime as needed for sleep. 30 tablet 0    Objective: BP (!) 139/111   Pulse 95   Temp 98.3 F (36.8 C) (Oral)   Resp 17  Ht 4' 9" (1.448 m)   Wt 102 kg   SpO2 100%   BMI 48.66 kg/m  Exam: Physical Exam Constitutional:      General: She is not in acute distress.    Appearance: She is obese. She is not ill-appearing, toxic-appearing or diaphoretic.  HENT:     Head: Normocephalic and atraumatic.  Eyes:     Pupils: Pupils are equal, round, and reactive to light.  Neck:     Vascular: JVD: unable to assess due to habitus.  Cardiovascular:     Rate and Rhythm: Regular rhythm. Tachycardia present.     Pulses:          Radial pulses are 2+ on the left side.       Dorsalis pedis pulses are 2+ on the right side and 2+ on the left side.     Heart sounds: Normal heart sounds.  Pulmonary:     Effort: Tachypnea present. No accessory muscle usage or respiratory distress.     Breath sounds: Normal breath sounds. No stridor.  Abdominal:     Palpations: Abdomen is soft.     Tenderness: There is no abdominal tenderness.     Comments: distended  Musculoskeletal:     Right lower leg: Edema present.     Left lower leg: Edema present.  Skin:    General: Skin is warm and dry.     Capillary Refill: Capillary refill takes less than 2 seconds.  Neurological:     General: No focal deficit present.     Mental Status: She is alert and oriented to person, place, and time.  Psychiatric:        Mood and Affect: Mood is anxious.     Comments: Mood down, affect flat     Labs and  Imaging: CBC BMET  Recent Labs  Lab 03/27/19 1301  WBC 7.8  HGB 10.5*  HCT 34.7*  PLT 276   Recent Labs  Lab 03/27/19 1301  NA 137  K 3.5  CL 101  CO2 24  BUN 19  CREATININE 1.28*  GLUCOSE 174*  CALCIUM 8.4*     Dg Chest Port 1 View  Result Date: 03/27/2019 CLINICAL DATA:  Chest pain and shortness of breath EXAM: PORTABLE CHEST 1 VIEW COMPARISON:  02/19/2019 FINDINGS: Cardiac shadow remains enlarged but stable. The lungs are well aerated bilaterally. No focal infiltrate or sizable effusion is seen. Minimal platelike atelectasis is noted in the left mid lung. IMPRESSION: Minimal platelike atelectasis on the left. Electronically Signed   By: Inez Catalina M.D.   On: 03/27/2019 13:55   EKG: sinus tachycardia to 100 bpm, nonspecific t-wave changes likely due to component of demand ischemia (no change from previous), likely left atrial enlargement (2/2 HF), borderline prolonged QT, QTc manually calculated to 465 ms.  Gladys Damme, MD 03/27/2019, 3:27 PM PGY-1, [facility] pager: [phone], text pages welcome  FPTS Upper-Level Resident Addendum   I have independently interviewed and examined the patient. I have discussed the above with the original author and agree with their documentation. My edits for correction/addition/clarification are in blue. Please see also any attending notes.    Kathrene Alu, MD PGY-3, [facility] 03/27/2019 7:46 PM  [facility] pager: [phone] (text pages welcome through [place])

## 2019-03-27 NOTE — ED Provider Notes (Signed)
[facility] EMERGENCY DEPARTMENT Provider Note   CSN: [id number] Arrival date & time: 03/27/19  1219     History   Chief Complaint Chief Complaint  Patient presents with  . Chest Pain    HPI [name] is a 43 y.o. female w/ hx of systolic CHF (ef 67-54% echo June 2020), A fib on xarelto, Dm2, obesity, chronic pain, presenting to the Ed with chest and abdominal pain.  She presents by EMS from her primary physician's office, after reporting chest pain to her PCP.  She reports 4 days of intermittent but worsening lower chest pain and abdominal pain.  She has had chronic pain in this area for several months and does not believe it is adequately treated.  She reports the pain is sharp, stabbing and pressure all at once.  It comes and goes at random.  It is not associated with activity or rest.  She reports worsening orthopnea, dyspnea on exertion, weight gain, and bilateral leg swelling for the past week.  She was recently increased from lasix dose 40 mg BID to 80 mg BID and has been compliant with her medications.    Echo 12/31/18 IMPRESSIONS    1. The left ventricle has severely reduced systolic function, with an ejection fraction of 20-25%. The cavity size was normal. Left ventricular diastolic function could not be evaluated secondary to atrial fibrillation.  2. The average left ventricular global longitudinal strain is severely abnormal at 5.7 %.  3. The right ventricle has moderately reduced systolic function. The cavity was normal. There is no increase in right ventricular wall thickness. Right ventricular systolic pressure could not be assessed.  4. The inferior vena cava was normal in size with <50% respiratory variability.  FINDINGS  Left Ventricle: The left ventricle has severely reduced systolic function, with an ejection fraction of 20-25%. The cavity size was normal. There is no increase in left ventricular wall thickness. Left ventricular diastolic  function could not be  evaluated secondary to atrial fibrillation. The average left ventricular global longitudinal strain is 5.7 %.  HPI  Past Medical History:  Diagnosis Date  . Abnormal Pap smear   . Anemia   . Asthma   . Diabetes mellitus   . Hypertension   . 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    . Migraines   . Morbid obesity ([place]) 02/06/2019  . Obstructive sleep apnea 06/30/2015  . Polycystic ovarian disease     Patient Active Problem List   Diagnosis Date Noted  . Chest pain 03/27/2019  . Attempting to conceive 03/10/2019  . Musculoskeletal pain 03/10/2019  . Acute on chronic HFrEF (heart failure with reduced ejection fraction) ([place]) 02/19/2019  . Asthma   . Hyperglycemia   . Yeast infection 02/06/2019  . Right ventricular failure ([place]) 02/06/2019  . Morbid obesity ([place]) 02/06/2019  . DKA, type 2 ([place]) 02/06/2019  . HFrEF (heart failure with reduced ejection fraction) ([place]) 12/31/2018  . Paroxysmal atrial fibrillation ([place]) 12/31/2018  . Anxiety 12/31/2018  . Nausea 12/27/2018  . Hyperlipidemia associated with type 2 diabetes mellitus ([place]) 10/30/2017  . Obstructive sleep apnea 06/30/2015  . Shortness of breath 06/29/2015  . Missed period 04/05/2013  . Anemia due to blood loss, chronic 06/27/2012  . Allergic rhinitis 06/27/2012  . Major depressive disorder 02/08/2012  . Hypertension associated with diabetes ([place]) 12/04/2011  . Dysmenorrhea  11/22/2011  . DM2 (diabetes mellitus, type 2) ([place]) 05/19/2011  . PCOS (polycystic ovarian syndrome) 05/19/2011  . Hydrosalpinx 08/21/2009    Past Surgical History:  Procedure Laterality Date  . CERVIX LESION DESTRUCTION    . CESAREAN SECTION       OB History    Gravida  2   Para  2   Term  2   Preterm      AB      Living  2     SAB      TAB      Ectopic      Multiple      Live Births   2            Home Medications    Prior to Admission medications   Medication Sig Start Date End Date Taking? Authorizing Provider  ACCU-CHEK SOFTCLIX LANCETS lancets Check fasting blood sugar daily in the morning 05/15/15   Mariel Aloe, MD  Accu-Chek Softclix Lancets lancets Use as instructed 02/12/19   Caroline More, DO  albuterol (VENTOLIN HFA) 108 (90 Base) MCG/ACT inhaler Inhale 2 puffs into the lungs every 6 (six) hours as needed for wheezing or shortness of breath. 12/25/18   Leeanne Rio, MD  amLODipine (NORVASC) 5 MG tablet Take 1 tablet (5 mg total) by mouth at bedtime. 02/12/19   Caroline More, DO  Blood Glucose Monitoring Suppl (ACCU-CHEK AVIVA PLUS) w/Device KIT 1 application by Does not apply route QID. Check fasting sugars as well as throughout the day 02/12/19   Caroline More, DO  carvedilol (COREG) 12.5 MG tablet Take 1 tablet (12.5 mg total) by mouth 2 (two) times daily. 02/25/19 03/27/19  Donne Hazel, MD  furosemide (LASIX) 40 MG tablet Take 2 tablets (80 mg total) by mouth 2 (two) times daily. 03/13/19   [name], DO  glucose blood (ACCU-CHEK AVIVA PLUS) test strip Use as instructed 02/12/19   Caroline More, DO  glucose blood (AGAMATRIX PRESTO TEST) test strip Use as instructed 10/23/17   Zenia Resides, MD  hydrochlorothiazide (HYDRODIURIL) 25 MG tablet Take 1 tablet (25 mg total) by mouth daily. 10/15/18   [name], DO  hydrOXYzine (ATARAX/VISTARIL) 10 MG tablet Take 1 tablet (10 mg total) by mouth 3 (three) times daily as needed for anxiety. 03/13/19 06/11/19  [name], DO  insulin glargine (LANTUS) 100 UNIT/ML injection Inject 0.2 mLs (20 Units total) into the skin daily. 02/09/19   Carollee Leitz, MD  Lancets Misc. (ACCU-CHEK SOFTCLIX LANCET DEV) KIT 1 application by Does not apply route 4 (four) times daily. 02/12/19   Caroline More, DO  rivaroxaban (XARELTO) 20 MG TABS tablet Take 1 tablet (20 mg total) by mouth daily with supper. 03/27/19    [name], DO  traZODone (DESYREL) 50 MG tablet Take 1 tablet (50 mg total) by mouth at bedtime as needed for sleep. 03/13/19   Kathrene Alu, MD    Family History Family History  Problem Relation Age of Onset  . Diabetes Mother   . Hypertension Mother   . Hypertension Father   . Diabetes Father     Social History Social History   Tobacco Use  . Smoking status: Never Smoker  . Smokeless tobacco: Never Used  Substance Use Topics  . Alcohol use: No    Alcohol/week: 0.0 standard drinks  . Drug use: No     Allergies   Shellfish allergy and Metformin and related   Review of Systems Review of  Systems  Constitutional: Negative for chills and fever.  Eyes: Negative for pain and visual disturbance.  Respiratory: Positive for chest tightness and shortness of breath. Negative for cough.   Cardiovascular: Positive for chest pain and leg swelling. Negative for palpitations.  Gastrointestinal: Positive for abdominal distention and nausea. Negative for abdominal pain and vomiting.  Musculoskeletal: Positive for arthralgias, back pain and myalgias.  Neurological: Negative for seizures and syncope.  All other systems reviewed and are negative.    Physical Exam Updated Vital Signs BP (!) 125/96   Pulse 97   Temp 98.3 F (36.8 C) (Oral)   Resp 17   Ht _0  (1.448 m)   Wt 102 kg   SpO2 100%   BMI 48.66 kg/m   Physical Exam Vitals signs and nursing note reviewed.  Constitutional:      Appearance: She is well-developed.     Comments: Tearful affect, sitting upright at 90 degrees  HENT:     Head: Normocephalic and atraumatic.  Eyes:     Conjunctiva/sclera: Conjunctivae normal.  Neck:     Musculoskeletal: Neck supple.  Cardiovascular:     Rate and Rhythm: Normal rate and regular rhythm.     Heart sounds: No murmur.  Pulmonary:     Effort: Pulmonary effort is normal. No respiratory distress.     Breath sounds: Normal breath sounds.     Comments: SOB and dyspneic  with supine exam, with no immediate desaturations 98% on room air Speaking comfortable when sitting upright Abdominal:     General: Abdomen is protuberant. There is distension.     Palpations: Abdomen is soft. There is fluid wave.     Tenderness: There is no abdominal tenderness.  Musculoskeletal:     Right lower leg: Edema present.     Left lower leg: Edema present.     Comments: Symmetrical pitting edema  Skin:    General: Skin is warm and dry.  Neurological:     Mental Status: She is alert.      ED Treatments / Results  Labs (all labs ordered are listed, but only abnormal results are displayed) Labs Reviewed  BASIC METABOLIC PANEL - Abnormal; Notable for the following components:      Result Value   Glucose, Bld 174 (*)    Creatinine, Ser 1.28 (*)    Calcium 8.4 (*)    GFR calc non Af Amer 52 (*)    All other components within normal limits  CBC - Abnormal; Notable for the following components:   RBC 5.13 (*)    Hemoglobin 10.5 (*)    HCT 34.7 (*)    MCV 67.6 (*)    MCH 20.5 (*)    RDW 21.9 (*)    All other components within normal limits  BRAIN NATRIURETIC PEPTIDE - Abnormal; Notable for the following components:   B Natriuretic Peptide 711.2 (*)    All other components within normal limits  CBG MONITORING, ED - Abnormal; Notable for the following components:   Glucose-Capillary 160 (*)    All other components within normal limits  SARS CORONAVIRUS 2 (TAT 6-24 HRS)  TROPONIN I (HIGH SENSITIVITY)  TROPONIN I (HIGH SENSITIVITY)    EKG None  Radiology Dg Chest Port 1 View  Result Date: 03/27/2019 CLINICAL DATA:  Chest pain and shortness of breath EXAM: PORTABLE CHEST 1 VIEW COMPARISON:  02/19/2019 FINDINGS: Cardiac shadow remains enlarged but stable. The lungs are well aerated bilaterally. No focal infiltrate or sizable effusion is seen. Minimal  platelike atelectasis is noted in the left mid lung. IMPRESSION: Minimal platelike atelectasis on the left.  Electronically Signed   By: Inez Catalina M.D.   On: 03/27/2019 13:55    Procedures Procedures (including critical care time)  Medications Ordered in ED Medications  amLODipine (NORVASC) tablet 5 mg (has no administration in time range)  carvedilol (COREG) tablet 12.5 mg (has no administration in time range)  hydrOXYzine (ATARAX/VISTARIL) tablet 10 mg (has no administration in time range)  traZODone (DESYREL) tablet 50 mg (has no administration in time range)  rivaroxaban (XARELTO) tablet 20 mg (has no administration in time range)  sodium chloride flush (NS) 0.9 % injection 3 mL (has no administration in time range)  sodium chloride flush (NS) 0.9 % injection 3 mL (has no administration in time range)  0.9 %  sodium chloride infusion (has no administration in time range)  acetaminophen (TYLENOL) tablet 650 mg (has no administration in time range)  furosemide (LASIX) injection 80 mg (80 mg Intravenous Given 03/27/19 1348)     Initial Impression / Assessment and Plan / ED Course  I have reviewed the triage vital signs and the nursing notes.  Pertinent labs & imaging results that were available during my care of the patient were reviewed by me and considered in my medical decision making (see chart for details).  43 yo female w/ advanced systolic CHF (EF 83-38%), on 80 mg lasix, A. Fib on xarelto, obestity, DM2, and chronic pain, presenting to the ED by EMS for chest pain evaluation.  On further questioning, the patient's pain is diffuse and primarily located in her abdomen and lower back, although she does describe left lower sided chest pain.  This has been ongoing for months but worsening in the past 4 days.  She describes significant volume retention, and I believe her abdominal distension may be the source of her primary discomfort.  Clinically this appears consistent with worsening CHF, and possible CHF induced ischemia, given her T wave inversions.  There is no STEMI on ecg.  We will  check troponin level and chest xray.  I will also diurese with 80 mg IV lasix.    Labs pending I do not suspect sepsis or abdominal infection based on her vitals, history and clinical exam  Likewise I have a lower suspicion for PE.  She is already on xarelto.  She has no hypoxia or tachycardia here.  Her blood pressure is stable.  We will continue to monitor her status.  Clinical Course as of Mar 26 1602  Wed Mar 27, 2019  1351 Cr near baseline, Trop only mildly elevated   [MT]  1509 Given the degree of the patient's fluid retention and orthopnea and dyspnea on exertion, will admit to her family practice for further diuresis and possible repeat echocardiogram.  The patient is agreeable with this plan.   [MT]    Clinical Course User Index [MT] Wyvonnia Dusky, MD        Final Clinical Impressions(s) / ED Diagnoses   Final diagnoses:  Acute on chronic congestive heart failure, unspecified heart failure type [facility])  Abdominal pain, unspecified abdominal location  Chest pain, unspecified type    ED Discharge Orders    None       Wyvonnia Dusky, MD 03/27/19 [phone]

## 2019-03-27 NOTE — Assessment & Plan Note (Signed)
Plan was initially to change to 22 units from 20.  We will also be looking into if patient's insurance will cover a Lantus pen as opposed to the Lantus vial.  Patient would also prefer this  Ability to finalize this discussion was interrupted by patient's admission to the ED via EMS for chest pain.

## 2019-03-27 NOTE — ED Notes (Signed)
ED TO INPATIENT HANDOFF REPORT  ED Nurse Name and Phone #:   S Name/Age/Gender [name] 43 y.o. female Room/Bed: 014C/014C  Code Status   Code Status: DNR  Home/SNF/Other Home Patient oriented to: self, place, time and situation Is this baseline? Yes   Triage Complete: Triage complete  Chief Complaint cp  Triage Note Pt presents from her dr office with central CPx2 weeks that is non-radiating, intermittent, feels like pressure. Also experiencing sob made worse by exertion. EMS exam: A&o 4 , 4/10 pain, denies n/v/d and dizziness. 12 lead EKG unremarkable. Received 324mg  asa at Dr office, 0.4 nitro with EMS, no change in CP CBG 196mg /dL BP 134/92, 98 reg 99%RA H/o heart disease (EF 20-25%) and DM (both diagnosed in May)   Allergies Allergies  Allergen Reactions  . Shellfish Allergy Swelling    Airway involvement including EMS - Has Epi-Pen  . Metformin And Related Diarrhea    Level of Care/Admitting Diagnosis ED Disposition    ED Disposition Condition [facility]: [place] [[id number]]  Level of Care: Telemetry Cardiac [103]  Covid Evaluation: Asymptomatic Screening Protocol (No Symptoms)  Diagnosis: Acute exacerbation of congestive heart failure [facility]) [id number]  Admitting Physician: Kathrene Alu [id number]  Attending Physician: Leeanne Rio [phone]  Estimated length of stay: past midnight tomorrow  Certification:: I certify this patient will need inpatient services for at least 2 midnights  PT Class (Do Not Modify): Inpatient [101]  PT Acc Code (Do Not Modify): Private [1]       B Medical/Surgery History Past Medical History:  Diagnosis Date  . Abnormal Pap smear   . Anemia   . Asthma   . Diabetes mellitus   . Hypertension   . 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    . Migraines   .  Morbid obesity ([place]) 02/06/2019  . Obstructive sleep apnea 06/30/2015  . Polycystic ovarian disease    Past Surgical History:  Procedure Laterality Date  . CERVIX LESION DESTRUCTION    . CESAREAN SECTION       A IV Location/Drains/Wounds Patient Lines/Drains/Airways Status   Active Line/Drains/Airways    Name:   Placement date:   Placement time:   Site:   Days:   Peripheral IV 03/27/19 Left Antecubital   03/27/19    1227    Antecubital   less than 1   External Urinary Catheter   02/19/19    -    -   36          Intake/Output Last 24 hours  Intake/Output Summary (Last 24 hours) at 03/27/2019 1656 Last data filed at 03/27/2019 1627 Gross per 24 hour  Intake -  Output 1100 ml  Net -1100 ml    Labs/Imaging Results for orders placed or performed during the hospital encounter of 03/27/19 (from the past 48 hour(s))  CBG monitoring, ED     Status: Abnormal   Collection Time: 03/27/19 12:23 PM  Result Value Ref Range   Glucose-Capillary 160 (H) 70 - 99 mg/dL  Basic metabolic panel     Status: Abnormal   Collection Time: 03/27/19  1:01 PM  Result Value Ref Range   Sodium 137 135 - 145 mmol/L   Potassium 3.5 3.5 - 5.1 mmol/L   Chloride 101 98 - 111 mmol/L   CO2 24 22 -  32 mmol/L   Glucose, Bld 174 (H) 70 - 99 mg/dL   BUN 19 6 - 20 mg/dL   Creatinine, Ser 1.28 (H) 0.44 - 1.00 mg/dL   Calcium 8.4 (L) 8.9 - 10.3 mg/dL   GFR calc non Af Amer 52 (L) >60 mL/min   GFR calc Af Amer >60 >60 mL/min   Anion gap 12 5 - 15    Comment: Performed at [facility] [address] [place], [place] [address]  CBC     Status: Abnormal   Collection Time: 03/27/19  1:01 PM  Result Value Ref Range   WBC 7.8 4.0 - 10.5 K/uL   RBC 5.13 (H) 3.87 - 5.11 MIL/uL   Hemoglobin 10.5 (L) 12.0 - 15.0 g/dL   HCT 34.7 (L) 36.0 - 46.0 %   MCV 67.6 (L) 80.0 - 100.0 fL   MCH 20.5 (L) 26.0 - 34.0 pg   MCHC 30.3 30.0 - 36.0 g/dL   RDW 21.9 (H) 11.5 - 15.5 %   Platelets 276 150 - 400 K/uL   nRBC 0.0 0.0  - 0.2 %    Comment: Performed at [facility] [address], [place], [place] [address]  Troponin I (High Sensitivity)     Status: None   Collection Time: 03/27/19  1:01 PM  Result Value Ref Range   Troponin I (High Sensitivity) 14 <18 ng/L    Comment: (NOTE) Elevated high sensitivity troponin I (hsTnI) values and significant  changes across serial measurements may suggest ACS but many other  chronic and acute conditions are known to elevate hsTnI results.  Refer to the "Links" section for chest pain algorithms and additional  guidance. Performed at [facility], [place] [address], [place], [place] [address]   Brain natriuretic peptide     Status: Abnormal   Collection Time: 03/27/19  1:02 PM  Result Value Ref Range   B Natriuretic Peptide 711.2 (H) 0.0 - 100.0 pg/mL    Comment: Performed at [facility] [address], [place], [place] [address]  Troponin I (High Sensitivity)     Status: None   Collection Time: 03/27/19  3:01 PM  Result Value Ref Range   Troponin I (High Sensitivity) 15 <18 ng/L    Comment: (NOTE) Elevated high sensitivity troponin I (hsTnI) values and significant  changes across serial measurements may suggest ACS but many other  chronic and acute conditions are known to elevate hsTnI results.  Refer to the "Links" section for chest pain algorithms and additional  guidance. Performed at [facility], [place] [address] [place], [place] [address]    Dg Chest Port 1 View  Result Date: 03/27/2019 CLINICAL DATA:  Chest pain and shortness of breath EXAM: PORTABLE CHEST 1 VIEW COMPARISON:  02/19/2019 FINDINGS: Cardiac shadow remains enlarged but stable. The lungs are well aerated bilaterally. No focal infiltrate or sizable effusion is seen. Minimal platelike atelectasis is noted in the left mid lung. IMPRESSION: Minimal platelike atelectasis on the left. Electronically Signed   By: Inez Catalina M.D.   On: 03/27/2019 13:55    Pending  Labs Unresulted Labs (From admission, onward)    Start     Ordered   03/28/19 [id number]  Basic metabolic panel  Daily,   R     03/27/19 1540   03/27/19 1607  Pregnancy, urine  Once,   STAT     03/27/19 1606   03/27/19 1426  SARS CORONAVIRUS 2 (TAT  6-24 HRS) Nasopharyngeal Nasopharyngeal Swab  (Asymptomatic/Tier 2 Patients Labs)  Once,   STAT    Question Answer Comment  Is this test for diagnosis or screening Screening   Symptomatic for COVID-19 as defined by CDC No   Hospitalized for COVID-19 No   Admitted to ICU for COVID-19 No   Previously tested for COVID-19 Yes   Resident in a congregate (group) care setting No   Employed in healthcare setting No   Pregnant No      03/27/19 1425          Vitals/Pain Today's Vitals   03/27/19 1445 03/27/19 1545 03/27/19 1626 03/27/19 1629  BP: (!) 139/111 (!) 125/96 (!) 145/113   Pulse: 95 97 95   Resp: 17  19   Temp:      TempSrc:      SpO2: 100% 100% 99%   Weight:      Height:      PainSc:    8     Isolation Precautions No active isolations  Medications Medications  amLODipine (NORVASC) tablet 5 mg (has no administration in time range)  carvedilol (COREG) tablet 12.5 mg (has no administration in time range)  hydrOXYzine (ATARAX/VISTARIL) tablet 10 mg (has no administration in time range)  traZODone (DESYREL) tablet 50 mg (has no administration in time range)  rivaroxaban (XARELTO) tablet 20 mg (has no administration in time range)  sodium chloride flush (NS) 0.9 % injection 3 mL (3 mLs Intravenous Given 03/27/19 1642)  sodium chloride flush (NS) 0.9 % injection 3 mL (has no administration in time range)  0.9 %  sodium chloride infusion (has no administration in time range)  acetaminophen (TYLENOL) tablet 650 mg (has no administration in time range)  furosemide (LASIX) injection 80 mg (has no administration in time range)  furosemide (LASIX) injection 80 mg (80 mg Intravenous Given 03/27/19 1348)    Mobility walks with person  assist Low fall risk   Focused Assessments Cardiac Assessment Handoff:  Cardiac Rhythm: Normal sinus rhythm Lab Results  Component Value Date   TROPONINI <0.03 02/04/2016   Lab Results  Component Value Date   DDIMER 0.30 02/04/2016   Does the Patient currently have chest pain? No     R Recommendations: See Admitting Provider Note  Report given to:   Additional Notes:

## 2019-03-27 NOTE — Care Management (Signed)
ED CM met with patient concerning 3ED visits in the past 6 months. Patient sites difficulty obtaining monthly meds as a barrier.  Patient is active with [facility] last appointment 02/2019, patient states she is uninsured and has not been able to afford to fill all of her prescriptions which has resulted in not following treatment regimen.  CM discussed Medication Assistance Programs and will also consult with CSW at the clinic to assist with outpatient resources. Patient is agreeable and a message will be forwarded to clinic's CSW.  No Further ED CM needs identified.

## 2019-03-27 NOTE — Assessment & Plan Note (Addendum)
Due to miscommunication patient has never actually connected with heart failure clinic, as she is being admitted to the ED for ACS rule out we will have to connect her with them if she gets admitted.  Current diuretic regimen does not seem to be keeping up she is currently on 80 mg furosemide twice daily.  Estimated dry weight of 210, patient to 25 in clinic today.  No crackles on exam, no oxygen requirement.

## 2019-03-27 NOTE — ED Triage Notes (Signed)
Pt presents from her dr office with central CPx2 weeks that is non-radiating, intermittent, feels like pressure. Also experiencing sob made worse by exertion. EMS exam: A&o 4 , 4/10 pain, denies n/v/d and dizziness. 12 lead EKG unremarkable. Received 324mg  asa at Dr office, 0.4 nitro with EMS, no change in CP CBG 196mg /dL BP 134/92, 98 reg 99%RA H/o heart disease (EF 20-25%) and DM (both diagnosed in May)

## 2019-03-27 NOTE — Progress Notes (Signed)
Subjective:  [name] is a 43 y.o. female who presents to the [facility] today with a chief complaint of chest pain.   HPI: Patient comes in for evaluation for chest pain and shortness of breath.  Also follow-up from prior CHF visit as patient has confirmed ejection fraction of 20 to 25% recently.  Patient was seen by cardiology and instructed to follow-up with CHF clinic but has been unable to do so due to miscommunication.   She was recently increased from 40 mg of furosemide twice daily to 80 due to an inconsistent urinary reaction on the 80.  She says that she has been consistent taken to 80 mg and now has a good urinary response with that.  Despite that she has had a weight increase from a estimated dry weight of 210 pounds to 225 here in this clinic.  She was 214 at her last visit.  She has called in the office complaining of shortness of breath whenever she lays down and generalized intermittent pain across her belly and chest.  She describes the chest pain as a tightness and a sharp pain at the same time.  She does not think it is necessarily tied to activity but is unpredictable and she has not found anything that makes it go away.  Belly pain is a tight pressure as well, patient has not found anything that works appropriately for that.  She also gets pain in her feet which have had significant swelling with 3+ pitting edema at this time.  Patient initially started saying that she has barely been doing anything and then describes a habit of changing from 7 water bottles a day to 2 water bottles per day but she also describes juice intake of approximately 2/day as well as glasses of water whenever she takes her medicine.  She says this pain is 7 out of 10 in her chest currently.  Diabetes is actually been better controlled with blood sugars between mid 100s and 230s.  She said that she has been taking the Lantus 20 regularly and has been trying to watch what she eats.  Objective:  Physical  Exam: BP (!) 124/98   Pulse 97   Wt 225 lb 9.6 oz (102.3 kg)   SpO2 99%   BMI 48.82 kg/m   Gen: Patient appears stable but ill, she is not comfortable. CV: RRR with 3/6 murmur Pulm: Patient does not have increased work of breathing and I am unable to find crackles but she does have decreased respirations sounds MSK: 3+ pitting edema bilaterally in legs they are firm and painful to touch Skin: warm, dry Neuro: grossly normal, moves all extremities Psych: Normal affect and thought content  No results found for this or any previous visit (from the past 72 hour(s)).   Assessment/Plan:  DM2 (diabetes mellitus, type 2) ([place]) Plan was initially to change to 22 units from 20.  We will also be looking into if patient's insurance will cover a Lantus pen as opposed to the Lantus vial.  Patient would also prefer this  Ability to finalize this discussion was interrupted by patient's admission to the ED via EMS for chest pain.  HFrEF (heart failure with reduced ejection fraction) ([place]) Due to miscommunication patient has never actually connected with heart failure clinic, as she is being admitted to the ED for ACS rule out we will have to connect her with them if she gets admitted.  Current diuretic regimen does not seem to be keeping  up she is currently on 80 mg furosemide twice daily.  Estimated dry weight of 210, patient to 25 in clinic today.  No crackles on exam, no oxygen requirement.  Chest pain 7 out of 10 chest pain with tightness, in a patient with significant uncontrolled diabetes as well as CHF with reduced ejection fraction to 20 to 25%.  Given aspirin 325, pulled EKG which showed some T wave abnormalities but no specific ST elevation, vital signs stable, EMS to transport to ED for ACS rule out   [name], [place] - PGY3 03/27/2019 12:06 PM

## 2019-03-28 ENCOUNTER — Inpatient Hospital Stay (HOSPITAL_COMMUNITY): Payer: Self-pay

## 2019-03-28 DIAGNOSIS — E119 Type 2 diabetes mellitus without complications: Secondary | ICD-10-CM

## 2019-03-28 DIAGNOSIS — R0602 Shortness of breath: Secondary | ICD-10-CM

## 2019-03-28 DIAGNOSIS — I5022 Chronic systolic (congestive) heart failure: Secondary | ICD-10-CM

## 2019-03-28 DIAGNOSIS — I10 Essential (primary) hypertension: Secondary | ICD-10-CM

## 2019-03-28 DIAGNOSIS — I480 Paroxysmal atrial fibrillation: Secondary | ICD-10-CM

## 2019-03-28 DIAGNOSIS — E1159 Type 2 diabetes mellitus with other circulatory complications: Secondary | ICD-10-CM

## 2019-03-28 LAB — BASIC METABOLIC PANEL
Anion gap: 12 (ref 5–15)
Anion gap: 12 (ref 5–15)
BUN: 17 mg/dL (ref 6–20)
BUN: 17 mg/dL (ref 6–20)
CO2: 25 mmol/L (ref 22–32)
CO2: 25 mmol/L (ref 22–32)
Calcium: 8.2 mg/dL — ABNORMAL LOW (ref 8.9–10.3)
Calcium: 8.7 mg/dL — ABNORMAL LOW (ref 8.9–10.3)
Chloride: 99 mmol/L (ref 98–111)
Chloride: 98 mmol/L (ref 98–111)
Creatinine, Ser: 1.18 mg/dL — ABNORMAL HIGH (ref 0.44–1.00)
Creatinine, Ser: 1.16 mg/dL — ABNORMAL HIGH (ref 0.44–1.00)
GFR calc Af Amer: >60 mL/min (ref >60)
GFR calc Af Amer: >60 mL/min (ref >60)
GFR calc non Af Amer: 57 mL/min — ABNORMAL LOW (ref >60)
GFR calc non Af Amer: 58 mL/min — ABNORMAL LOW (ref >60)
Glucose, Bld: 113 mg/dL — ABNORMAL HIGH (ref 70–99)
Glucose, Bld: 111 mg/dL — ABNORMAL HIGH (ref 70–99)
Potassium: 3.3 mmol/L — ABNORMAL LOW (ref 3.5–5.1)
Potassium: 4.2 mmol/L (ref 3.5–5.1)
Sodium: 136 mmol/L (ref 135–145)
Sodium: 135 mmol/L (ref 135–145)

## 2019-03-28 LAB — CBC
HCT: 33.8 % — ABNORMAL LOW (ref 36.0–46.0)
Hemoglobin: 10.6 g/dL — ABNORMAL LOW (ref 12.0–15.0)
MCH: 20.9 pg — ABNORMAL LOW (ref 26.0–34.0)
MCHC: 31.4 g/dL (ref 30.0–36.0)
MCV: 66.7 fL — ABNORMAL LOW (ref 80.0–100.0)
Platelets: 268 10*3/uL (ref 150–400)
RBC: 5.07 MIL/uL (ref 3.87–5.11)
RDW: 21.9 % — ABNORMAL HIGH (ref 11.5–15.5)
WBC: 6.9 10*3/uL (ref 4.0–10.5)
nRBC: 0 % (ref 0.0–0.2)

## 2019-03-28 LAB — ECHOCARDIOGRAM COMPLETE
Height: 57 in
Weight: 3619.07 oz

## 2019-03-28 LAB — GLUCOSE, CAPILLARY
Glucose-Capillary: 112 mg/dL — ABNORMAL HIGH (ref 70–99)
Glucose-Capillary: 176 mg/dL — ABNORMAL HIGH (ref 70–99)
Glucose-Capillary: 115 mg/dL — ABNORMAL HIGH (ref 70–99)
Glucose-Capillary: 165 mg/dL — ABNORMAL HIGH (ref 70–99)

## 2019-03-28 MED ORDER — SPIRONOLACTONE 12.5 MG HALF TABLET
12.5000 mg | ORAL_TABLET | Freq: Every day | ORAL | Status: DC
  Administered 2019-03-28 – 2019-03-29 (×2): 12.5 mg via ORAL
  Filled 2019-03-28 (×2): qty 1

## 2019-03-28 MED ORDER — SODIUM CHLORIDE 0.9 % IV SOLN
INTRAVENOUS | Status: DC
  Administered 2019-03-29 (×2): via INTRAVENOUS

## 2019-03-28 MED ORDER — SACUBITRIL-VALSARTAN 24-26 MG PO TABS
1.0000 | ORAL_TABLET | Freq: Two times a day (BID) | ORAL | Status: DC
  Administered 2019-03-28 – 2019-04-02 (×10): 1 via ORAL
  Filled 2019-03-28 (×10): qty 1

## 2019-03-28 MED ORDER — SODIUM CHLORIDE 0.9% FLUSH: 3.0000 mL | INTRAVENOUS | Status: DC | PRN

## 2019-03-28 MED ORDER — ATORVASTATIN CALCIUM 40 MG PO TABS
40.0000 mg | ORAL_TABLET | Freq: Every day | ORAL | Status: DC
  Administered 2019-03-28 – 2019-04-01 (×5): 40 mg via ORAL
  Filled 2019-03-28 (×5): qty 1

## 2019-03-28 MED ORDER — FUROSEMIDE 10 MG/ML IJ SOLN
80.0000 mg | Freq: Once | INTRAMUSCULAR | Status: AC
  Administered 2019-03-28: 09:00:00 80 mg via INTRAVENOUS
  Filled 2019-03-28: qty 8

## 2019-03-28 MED ORDER — GABAPENTIN 100 MG PO CAPS
100.0000 mg | ORAL_CAPSULE | Freq: Three times a day (TID) | ORAL | Status: DC
  Administered 2019-03-28 – 2019-04-02 (×15): 100 mg via ORAL
  Filled 2019-03-28 (×16): qty 1

## 2019-03-28 MED ORDER — SODIUM CHLORIDE 0.9% FLUSH
3.0000 mL | Freq: Two times a day (BID) | INTRAVENOUS | Status: DC
  Administered 2019-03-28 – 2019-04-02 (×4): 3 mL via INTRAVENOUS

## 2019-03-28 MED ORDER — POTASSIUM CHLORIDE CRYS ER 20 MEQ PO TBCR
40.0000 meq | EXTENDED_RELEASE_TABLET | Freq: Two times a day (BID) | ORAL | Status: AC
  Administered 2019-03-28 (×2): 40 meq via ORAL
  Filled 2019-03-28 (×2): qty 2

## 2019-03-28 MED ORDER — SODIUM CHLORIDE 0.9 % IV SOLN: 250.0000 mL | INTRAVENOUS | Status: DC | PRN

## 2019-03-28 MED ORDER — FUROSEMIDE 10 MG/ML IJ SOLN
80.0000 mg | Freq: Two times a day (BID) | INTRAMUSCULAR | Status: DC
  Administered 2019-03-28: 80 mg via INTRAVENOUS
  Filled 2019-03-28 (×2): qty 8

## 2019-03-28 MED ORDER — PERFLUTREN LIPID MICROSPHERE
1.0000 mL | INTRAVENOUS | Status: AC | PRN
  Administered 2019-03-28: 11:00:00 2 mL via INTRAVENOUS
  Filled 2019-03-28: qty 10

## 2019-03-28 MED ORDER — METOLAZONE 2.5 MG PO TABS
2.5000 mg | ORAL_TABLET | Freq: Once | ORAL | Status: AC
  Administered 2019-03-28: 2.5 mg via ORAL
  Filled 2019-03-28: qty 1

## 2019-03-28 MED ORDER — ASPIRIN 81 MG PO CHEW
81.0000 mg | CHEWABLE_TABLET | ORAL | Status: AC
  Administered 2019-03-29: 81 mg via ORAL
  Filled 2019-03-28: qty 1

## 2019-03-28 MED ORDER — HEPARIN (PORCINE) 25000 UT/250ML-% IV SOLN
1200.0000 [IU]/h | INTRAVENOUS | Status: DC
  Administered 2019-03-28: 17:00:00 900 [IU]/h via INTRAVENOUS
  Administered 2019-03-29: 15:00:00 1200 [IU]/h via INTRAVENOUS
  Filled 2019-03-28 (×2): qty 250

## 2019-03-28 MED ORDER — POTASSIUM CHLORIDE CRYS ER 20 MEQ PO TBCR
40.0000 meq | EXTENDED_RELEASE_TABLET | Freq: Once | ORAL | Status: AC
  Administered 2019-03-28: 40 meq via ORAL
  Filled 2019-03-28: qty 2

## 2019-03-28 MED ORDER — POTASSIUM CHLORIDE CRYS ER 20 MEQ PO TBCR: 40.0000 meq | EXTENDED_RELEASE_TABLET | Freq: Two times a day (BID) | ORAL | Status: DC

## 2019-03-28 NOTE — Progress Notes (Signed)
Patient ID: [name], female   DOB: [birth year], 43 y.o.   MRN: [id number]    Advanced Heart Failure Team Consult Note   Primary Physician: [name], DO PCP-Cardiologist:  No primary care provider on file.  Reason for Consultation: CHF  HPI:    EVANGALINE JOU is seen today for evaluation of CHF at the request of Dr Andria Frames.   43 y.o. with history of type 2 diabetes (poorly controlled, last hgbA1c > 15), poorly controlled HTN, hyperlipidemia, paroxysmal atrial fibrillation, and chronic systolic CHF of uncertain etiology was admitted with CHF exacerbation.  Patient had an echo in 6/20 as outpatient due to exertional dyspnea.  This showed EF 20-25%, moderate RV dysfunction.  She was also noted to have paroxysmal atrial fibrillation.  She was started on Xarelto.  In 7/20, she was admitted with DKA. In 8/20, she was admitted with CHF exacerbation and diuresed.  She was sent home on Lasix 40 mg po bid.   After getting home, she progressively gained weight (up 15 lbs by the time of this admission).  Her Lasix was increased to 80 mg bid, but this did not help. She had been started on Entresto, but this was stopped since she said that she was trying to get pregnant.  She has been having chest heaviness that worsens with ambulation. She developed orthopnea and dyspnea walking a short distance, so was readmitted on 9/2 with CHF exacerbation.    BP is elevated.  BNP was elevated.  Hs-TnI was normal this admission.  HIV was negative in 7/20.  She does not drink ETOH or use drugs.  She is not a smoker.  No family history of cardiomyopathy or early CAD, but she does not know much about her mother's medical history or her mother's family. She has been in NSR since admission.   Review of Systems: All systems reviewed and negative except as per HPI.   Home Medications Prior to Admission medications   Medication Sig Start Date End Date Taking? Authorizing Provider  albuterol (VENTOLIN HFA) 108 (90  Base) MCG/ACT inhaler Inhale 2 puffs into the lungs every 6 (six) hours as needed for wheezing or shortness of breath. 12/25/18  Yes Leeanne Rio, MD  amLODipine (NORVASC) 5 MG tablet Take 1 tablet (5 mg total) by mouth at bedtime. 02/12/19  Yes Tammi Klippel, Sherin, DO  carvedilol (COREG) 12.5 MG tablet Take 1 tablet (12.5 mg total) by mouth 2 (two) times daily. 02/25/19 03/28/19 Yes Donne Hazel, MD  furosemide (LASIX) 40 MG tablet Take 2 tablets (80 mg total) by mouth 2 (two) times daily. 03/13/19  Yes Bland, Scott, DO  hydrochlorothiazide (HYDRODIURIL) 25 MG tablet Take 1 tablet (25 mg total) by mouth daily. 10/15/18  Yes [name], DO  hydrOXYzine (ATARAX/VISTARIL) 10 MG tablet Take 1 tablet (10 mg total) by mouth 3 (three) times daily as needed for anxiety. 03/13/19 06/11/19 Yes Bland, Scott, DO  insulin glargine (LANTUS) 100 UNIT/ML injection Inject 0.2 mLs (20 Units total) into the skin daily. 02/09/19  Yes Carollee Leitz, MD  rivaroxaban (XARELTO) 20 MG TABS tablet Take 1 tablet (20 mg total) by mouth daily with supper. 03/27/19  Yes [name], DO  traZODone (DESYREL) 50 MG tablet Take 1 tablet (50 mg total) by mouth at bedtime as needed for sleep. 03/13/19  Yes Kathrene Alu, MD  ACCU-CHEK SOFTCLIX LANCETS lancets Check fasting blood sugar daily in the morning 05/15/15   Mariel Aloe, MD  Accu-Chek Softclix  Lancets lancets Use as instructed 02/12/19   Caroline More, DO  Blood Glucose Monitoring Suppl (ACCU-CHEK AVIVA PLUS) w/Device KIT 1 application by Does not apply route QID. Check fasting sugars as well as throughout the day 02/12/19   Caroline More, DO  glucose blood (ACCU-CHEK AVIVA PLUS) test strip Use as instructed 02/12/19   Caroline More, DO  glucose blood (AGAMATRIX PRESTO TEST) test strip Use as instructed 10/23/17   Zenia Resides, MD  Lancets Misc. (ACCU-CHEK SOFTCLIX LANCET DEV) KIT 1 application by Does not apply route 4 (four) times daily. 02/12/19   Caroline More, DO     Past Medical History: 1. Type 2 diabetes 2. HTN 3. Depression 4. PCOS 5. Hyperlipidemia 6. Atrial fibrillation: Paroxysmal.  7. Chronic systolic CHF: Echo in 1/61 showed EF 20-25% with moderately decreased RV systolic function. HIV negative 7/20.  - Echo (8/20): EF 20-25%, mild LVH/mild LV dilation with LV thrombus, moderately decreased RV systolic function, moderate TR, mild MR.  8. LV thrombus: Noted on 8/20 echo.   Past Surgical History: Past Surgical History:  Procedure Laterality Date  . CERVIX LESION DESTRUCTION    . CESAREAN SECTION      Family History: Family History  Problem Relation Age of Onset  . Diabetes Mother   . Hypertension Mother   . Hypertension Father   . Diabetes Father     Social History: Social History   Socioeconomic History  . Marital status: Single    Spouse name: Not on file  . Number of children: Not on file  . Years of education: Not on file  . Highest education level: Not on file  Occupational History  . Not on file  Social Needs  . Financial resource strain: Not on file  . Food insecurity    Worry: Not on file    Inability: Not on file  . Transportation needs    Medical: Not on file    Non-medical: Not on file  Tobacco Use  . Smoking status: Never Smoker  . Smokeless tobacco: Never Used  Substance and Sexual Activity  . Alcohol use: No    Alcohol/week: 0.0 standard drinks  . Drug use: No  . Sexual activity: Yes  Lifestyle  . Physical activity    Days per week: Not on file    Minutes per session: Not on file  . Stress: Not on file  Relationships  . Social Herbalist on phone: Not on file    Gets together: Not on file    Attends religious service: Not on file    Active member of club or organization: Not on file    Attends meetings of clubs or organizations: Not on file    Relationship status: Not on file  Other Topics Concern  . Not on file  Social History Narrative  . Not on file    Allergies:   Allergies  Allergen Reactions  . Shellfish Allergy Swelling    Airway involvement including EMS - Has Epi-Pen  . Metformin And Related Diarrhea    Objective:    Vital Signs:   Temp:  [97.3 F (36.3 C)-98.4 F (36.9 C)] 97.3 F (36.3 C) (09/03 0758) Pulse Rate:  [85-99] 86 (09/03 0945) Resp:  [12-20] 20 (09/03 0945) BP: (116-145)/(96-119) 116/98 (09/03 0945) SpO2:  [96 %-100 %] 99 % (09/03 0758) Weight:  [101.3 kg-102.6 kg] 102.6 kg (09/03 0635) Last BM Date: 03/27/19  Weight change: Filed Weights   03/27/19 1229 03/27/19 2034  03/28/19 0635  Weight: 102 kg 101.3 kg 102.6 kg    Intake/Output:   Intake/Output Summary (Last 24 hours) at 03/28/2019 1120 Last data filed at 03/28/2019 0837 Gross per 24 hour  Intake 800 ml  Output 1900 ml  Net -1100 ml      Physical Exam    General:  NAD, obese.  HEENT: normal Neck: supple. JVP 16 cm. Carotids 2+ bilat; no bruits. No lymphadenopathy or thyromegaly appreciated. Cor: PMI nonpalpable. Regular rate & rhythm, soft S3.  2/6 HSM LLSB.  Lungs: clear Abdomen: soft, nontender, mildly distended. No hepatosplenomegaly. No bruits or masses. Good bowel sounds. Extremities: no cyanosis, clubbing, rash. 2+ edema to knees bilaterally.  Neuro: alert & orientedx3, cranial nerves grossly intact. moves all 4 extremities w/o difficulty. Affect pleasant   Telemetry   NSR 80s, personally reviewed.  EKG    Sinus tachy 100, poor RWP, nonspecific T wave changes.   Labs   Basic Metabolic Panel: Recent Labs  Lab 03/27/19 1301 03/28/19 0523  NA 137 136  K 3.5 3.3*  CL 101 99  CO2 24 25  GLUCOSE 174* 113*  BUN 19 17  CREATININE 1.28* 1.18*  CALCIUM 8.4* 8.2*    Liver Function Tests: No results for input(s): AST, ALT, ALKPHOS, BILITOT, PROT, ALBUMIN in the last 168 hours. No results for input(s): LIPASE, AMYLASE in the last 168 hours. No results for input(s): AMMONIA in the last 168 hours.  CBC: Recent Labs  Lab 03/27/19 1301  03/28/19 0523  WBC 7.8 6.9  HGB 10.5* 10.6*  HCT 34.7* 33.8*  MCV 67.6* 66.7*  PLT 276 268    Cardiac Enzymes: No results for input(s): CKTOTAL, CKMB, CKMBINDEX, TROPONINI in the last 168 hours.  BNP: BNP (last 3 results) Recent Labs    12/25/18 1026 02/19/19 2026 03/27/19 1302  BNP 401.2* 654.7* 711.2*    ProBNP (last 3 results) No results for input(s): PROBNP in the last 8760 hours.   CBG: Recent Labs  Lab 03/27/19 1223 03/27/19 2026 03/28/19 0640  GLUCAP 160* 99 112*    Coagulation Studies: No results for input(s): LABPROT, INR in the last 72 hours.   Imaging   Dg Chest Port 1 View  Result Date: 03/27/2019 CLINICAL DATA:  Chest pain and shortness of breath EXAM: PORTABLE CHEST 1 VIEW COMPARISON:  02/19/2019 FINDINGS: Cardiac shadow remains enlarged but stable. The lungs are well aerated bilaterally. No focal infiltrate or sizable effusion is seen. Minimal platelike atelectasis is noted in the left mid lung. IMPRESSION: Minimal platelike atelectasis on the left. Electronically Signed   By: Inez Catalina M.D.   On: 03/27/2019 13:55      Medications:     Current Medications: . atorvastatin  40 mg Oral q1800  . carvedilol  12.5 mg Oral BID  . furosemide  80 mg Intravenous BID  . gabapentin  100 mg Oral Q8H  . insulin aspart  0-9 Units Subcutaneous TID WC  . metolazone  2.5 mg Oral Once  . potassium chloride  40 mEq Oral BID  . potassium chloride  40 mEq Oral Once  . sacubitril-valsartan  1 tablet Oral BID  . sodium chloride flush  3 mL Intravenous Q12H  . sodium chloride flush  3 mL Intravenous Q12H  . spironolactone  12.5 mg Oral Daily     Infusions: . sodium chloride        Assessment/Plan   1. Acute on chronic systolic CHF: Diagnosed in 6/20.  Echo this admission shows  EF 20-25%, diffuse hypokinesis, LV thrombus, moderate RV systolic dysfunction, mild MR, moderate TR.  She has a history of paroxysmal atrial fibrillation but seems to be mostly  in sinus rhythm.  This is not likely to be a tachycardia-mediated cardiomyopathy.  Poorly controlled diabetes itself can cause a cardiomyopathy, as can poorly controlled HTN.  Myocarditis is a potential cause of her cardiomyopathy. With poorly controlled DM2 and HTN as well as hyperlipidemia, CAD is a concern.  She also reports chest heaviness worsening with exertion, though Hs-TnI was negative this admission.  She is very volume overloaded on exam, still orthopneic.  - Needs aggressive diuresis, will give Lasix 80 mg IV bid with a dose of metolazone today. Replace K.  - Stop amlodipine, start Entresto 24/26 bid.  - Start spironolactone 12.5 mg daily.  - Continue Coreg 12.5 mg bid for now.  - Place unna boots.  - She has been trying to get pregnant (has been difficult with polycystic ovarian syndrome).  She has 2 grown children already.  We discussed this.  With her cardiomyopathy and active CHF, pregnancy would create a significant mortality risk for her.  A number of cardiac meds that she would ideally be on to control her CHF are feto-toxic.  I recommended that she NOT try to get pregnant at this point and use a form of birth control.  She agreed with this.  - As above, need to rule out coronary disease as a potentially reversible etiology for her cardiomyopathy.  I will stop Xarelto and start her on heparin gtt.  She will stop heparin briefly tomorrow afternoon for RHC/LHC then restart heparin after cath. We discussed risks/benefits and she agrees to procedure.  2. LV thrombus: LV thrombus noted on echo today.  She says that she has been compliant with Xarelto.   - Stop Xarelto, she will need to transition to warfarin.  I will start heparin gtt today (will need bridging).  3. Atrial fibrillation: Paroxysmal. She has been in NSR this admission.  - Switching from Xarelto to warfarin with LV thrombus.  - Would consider atrial fibrillation ablation in the future given CASTLE-HF data.  4. Type 2  diabetes: Poor control, per primary service.  5. Hypertension: Med changes as above.    Length of Stay: 1  Loralie Champagne, MD  03/28/2019, 11:20 AM  Advanced Heart Failure Team Pager [phone] (M-F; 7a - 4p)  Please contact [facility] for night-coverage after hours (4p -7a ) and weekends on [email or web address]

## 2019-03-28 NOTE — Progress Notes (Signed)
ANTICOAGULATION CONSULT NOTE - Initial Consult  Pharmacy Consult for heparin Indication: LV Thrombus  Allergies  Allergen Reactions  . Shellfish Allergy Swelling    Airway involvement including EMS - Has Epi-Pen  . Metformin And Related Diarrhea    Patient Measurements: Height: 4\' 9"  (144.8 cm) Weight: 226 lb 3.1 oz (102.6 kg) IBW/kg (Calculated) : 38.6 Heparin Dosing Weight: 65kg  Vital Signs: Temp: 97.3 F (36.3 C) (09/03 0758) Temp Source: Oral (09/03 0945) BP: 116/98 (09/03 0945) Pulse Rate: 86 (09/03 0945)  Labs: Recent Labs    03/27/19 1301 03/27/19 1501 03/28/19 0523  HGB 10.5*  --  10.6*  HCT 34.7*  --  33.8*  PLT 276  --  268  CREATININE 1.28*  --  1.18*  TROPONINIHS 14 15  --     Estimated Creatinine Clearance: 63.6 mL/min (A) (by C-G formula based on SCr of 1.18 mg/dL (H)).   Medical History: Past Medical History:  Diagnosis Date  . Abnormal Pap smear   . Anemia   . Asthma   . Atrial fibrillation ([place])   . Diabetes mellitus   . Heart failure with reduced ejection fraction ([place])   . Hypertension   . 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    . Migraines   . Morbid obesity ([place]) 02/06/2019  . Obstructive sleep apnea 06/30/2015  . Polycystic ovarian disease     Assessment: 84 yoF admitted with SOB and new LV thrombus. Pt on Xarelto PTA for hx PAF, pharmacy to transition to heparin drip with cath scheduled for tomorrow. Last dose of Xarelto was 9/2 ~1830, will start heparin tonight at 1830 with no bolus. Will dose via aPTTs initially while DOAC clears.  Goal of Therapy:  Heparin level 0.3-0.7 units/ml aPTT 66-102 seconds Monitor platelets by anticoagulation protocol: Yes   Plan:  Heparin 900 units/hr no bolus at 1830 tonight Check 6hr aPTT Daily aPTT and heparin level Follow CBC and S/Sx bleeding   Arrie Senate, PharmD,  BCPS Clinical Pharmacist [phone] Please check AMION for all [place] numbers 03/28/2019

## 2019-03-28 NOTE — Discharge Summary (Signed)
[facility] Discharge Summary  [name] name: [name] Medical record number: [id number] Date of birth: Dec 27, 1975 Age: 43 y.o. Gender: female Date of Admission: 03/27/2019  Date of Discharge: 03/31/2009 Admitting Physician: Leeanne Rio, MD  Primary Care Provider: [name], DO Consultants: Cardiology  Indication for Hospitalization: acute on chronic HFrEF (EF20-25%), L ventricular thrombus s/p R/L heart catheterization  Discharge Diagnoses/Problem List:  Acute on Chronic HFrEF DMT2 HTN OSA Morbid Obesity A fib MDD Anxiety Menorrhagia Microcytic Anemia  Disposition: to home  Discharge Condition: stable and improved  Discharge Exam:  Physical Exam Vitals signs and nursing note reviewed.  Constitutional:      General: She is not in acute distress.    Appearance: She is obese. She is not ill-appearing, toxic-appearing or diaphoretic.  HENT:     Head: Normocephalic and atraumatic.  Eyes:     Pupils: Pupils are equal, round, and reactive to light.  Neck:     Musculoskeletal: Neck supple.     Vascular: No JVD.  Cardiovascular:     Rate and Rhythm: Normal rate and regular rhythm.     Pulses:          Carotid pulses are 2+ on the right side and 2+ on the left side.      Radial pulses are 2+ on the right side and 2+ on the left side.       Dorsalis pedis pulses are 2+ on the right side and 2+ on the left side.     Heart sounds: Normal heart sounds.  Pulmonary:     Effort: Pulmonary effort is normal. No tachypnea.     Breath sounds: Normal breath sounds.  Abdominal:     General: Bowel sounds are normal.     Palpations: Abdomen is soft.     Tenderness: There is no abdominal tenderness.     Comments: Markedly decreased distention since admission  Musculoskeletal:     Right lower leg: No edema.     Left lower leg: No edema.     Comments: No edema in bilateral lower extremities  Skin:    General: Skin is warm and dry.      Capillary Refill: Capillary refill takes less than 2 seconds.  Neurological:     General: No focal deficit present.     Mental Status: She is alert and oriented to person, place, and time.  Psychiatric:        Behavior: Behavior normal.     Comments: [name] does have moderate depression and very flat affect. This is stable for her and mood overall slightly improved since admission.      Brief Hospital Course:  [name] was admitted to the hospital on 9/2 after 4 days of worsening orthopnea, leg swelling, and chest pain. [name] has significant medical history including paroxysmal atrial fibrillation (on rivaroxaban), and she was diagnosed earlier this summer with HFrEF (EF 20-25%) thought to be due to an underlying tachyarrhythmia at that time. Her home dose of furosemide had been recently increased from 40 to 80 mg BID, but she continued to gain weight. [name] insisted that she was taking all of her medications, however, all of the foods that she endorsed eating were heavy in salt (chips, deli meat, fried chicken)  likely contributing to the volume overloaded status. On admission, she had significant pitting edema 2+ to above the knee, a distended abdomen, but lungs were clear to auscultation bilaterally. Troponin was negative x2, ekg revealed NSR with  lateral T wave changes, BNP elevated to 711, and CXR revealed atelectasis. Of note, several medications (statin, entresto) had been stopped because the [name] was attempting to conceive despite education about the risks of pregnancy with HF and DMT2.  She was counseled about these dangers and decided not to attempt to conceive at this time.  Atorvastatin 40 mg daily was restarted prior to discharge.  Cardiology was consulted and she had a repeat echo this admission which confirmed EF 20-25%, diffuse hypokinesis, LV thrombus, mod RV systolic dysfunction, mild MR, and moderate TR. She remained in NSR, however due to the thrombus, rivaroxaban was  discontinued and she started warfarin with a heparin bridge. Therapeutic goal of INR 2-3 reached on 9/7 and heparin was discontinued. Her HF was managed with entresto 24/26 BID, spironolactone 25 mg qd, coreg 6.5 mg BID, digoxin 0.125 mg PO, torsemide 40 mg daily, and unna boots.  Strict diet guidelines including a salt restriction of less than 1500 mg/day was discussed in depth with [name] during this hospitalization.  Of note even after that discussion [name] had [employer] fried chicken salad while in the hospital.  Multiple providers have discussed with her the importance of restricting her salt intake to protect her heart.  [name] was diuresed with an infusion of furosemide. Her kidneys tolerated this well and creatinine continued to drop from 1.28 on admission to 1.01 on 9/8. This was likely a component of resolving AKI from her last admission in August 2020 as her baseline cr appears to be about 0.8-0.9. She lost a total of 45.7 lbs (20.7 kg), 23.7L of fluid, and her new dry weight upon discharge was 179.2 lbs. She was discharged on torsemide 40 mg qd.  The very large amount of diuresis in combination with how well her kidneys tolerated the level of diuresis suggested that she was not adherent to her home medications.  Cardiology performed a R/L heart cath on 9/4 to rule out CAD as a potentially reversible etiology of her cardiomyopathy. Catherterization revealed nonobstructive CAD as well as R>L heart failure with low PAPI, suggesting significant RV dysfunction.   [name] remained in NSR throughout this admission, but cardiology suggested that she could consider fibrillation ablation in the future for paroxysmal atrial fibrillation given CASTLE-HF data.  [name] was admitted for DKA in July 2020 and A1c that month was 15.5. On admission blood glucose was 160, she had been on 20U lantus after last discharge. However, pt remained in target BG range of 140-180 on sensitive SSI without lantus. She  required on average 3 units of Aspart per day, and a total of 7 units on 9/7 her last full day in the hospital, indicating that her diabetes is quite sensitive to insulin and likely her medication adherence outside the hospital leaves much to be desired. However, she does not have insurance and obtaining medication was a barrier. [name] was started on jardiance via the matching program with [facility]. Will attempt to set up [name] with orange card as an outpatient so to remedy this barrier.  [name] also had complaints of significant, neuropathic leg pain. Gabapentin 159m TID was started with completely resolved the pain, which was likely secondary to diabetic neuropathy.  [name] had clinically significant depression during this admission, as she wanted to be DNR on the day of admission. She revised her status to full code on her second day of hospitalization, but she feels defeated by her diagnosis and is deeply unhappy with a flat affect. Duloxetine  30 mg was started as she preferred to try an SSRI without counseling.   Issues for Follow Up:  1. AoC HFrEF (EF 20-25%) -follow-up with cardiology. Diet- salt restriction < 1500 mg/day, will need close follow up and support on adhering to this diet 2. Ventricular thrombus-transition to warfarin, will follow-up in heart failure warfarin clinic 3. [employer] Product manager- f/u with Leory Plowman in [facility] to assist 4. Depression - started on Duloxetine 30 mg, consider increasing dose 5. Diabetes - started on jardiance and received 30 day supply in hospital. Closely monitor BG. 6. Check weight and ask about diet/medication adherence  Significant Procedures:   Right/Left Heart Cath and Coronary Angiography: 1. No obstructive CAD, nonischemic cardiomyopathy.  2. R > L heart failure with low PAPI suggesting significant RV dysfunction.  3. Primarily pulmonary venous hypertension.  4. Low cardiac output, CI 2.07.   Significant Labs  and Imaging:  Recent Labs  Lab 03/31/19 0808 04/01/19 0328 04/02/19 0522  WBC 6.5 7.0 10.0  HGB 12.9 12.7 12.6  HCT 42.9 41.6 41.9  PLT 266 282 276   Recent Labs  Lab 03/29/19 0511  03/29/19 1652 03/30/19 0629 03/31/19 0808 04/01/19 0328 04/02/19 0522  NA 134*   < > 139 135 133* 132* 133*  K 3.7   < > 3.7 3.8 3.6 3.3* 3.9  CL 97*  --   --  94* 84* 84* 90*  CO2 26  --   --  29 35* 36* 30  GLUCOSE 130*  --   --  114* 135* 115* 131*  BUN 17  --   --  _0 CREATININE 1.01*  --   --  0.96 1.05* 1.24* 1.01*  CALCIUM 8.7*  --   --  9.1 9.3 9.2 9.2  ALKPHOS  --   --   --   --   --   --  139*  AST  --   --   --   --   --   --  28  ALT  --   --   --   --   --   --  14  ALBUMIN  --   --   --   --   --   --  2.6*   < > = values in this interval not displayed.    Results/Tests Pending at Time of Discharge: none  Discharge Medications:  Allergies as of 04/02/2019      Reactions   Shellfish Allergy Swelling   Airway involvement including EMS - Has Epi-Pen   Metformin And Related Diarrhea      Medication List    STOP taking these medications   amLODipine 5 MG tablet Commonly known as: NORVASC   furosemide 40 MG tablet Commonly known as: LASIX   hydrochlorothiazide 25 MG tablet Commonly known as: HYDRODIURIL   insulin glargine 100 UNIT/ML injection Commonly known as: LANTUS   rivaroxaban 20 MG Tabs tablet Commonly known as: Xarelto     TAKE these medications   Accu-Chek Aviva Plus w/Device Kit 1 application by Does not apply route QID. Check fasting sugars as well as throughout the day   Accu-Chek Softclix Lancet Dev Kit 1 application by Does not apply route 4 (four) times daily.   Accu-Chek Softclix Lancets lancets Check fasting blood sugar daily in the morning   Accu-Chek Softclix Lancets lancets Use as instructed   albuterol 108 (90 Base) MCG/ACT inhaler Commonly known as: VENTOLIN HFA Inhale 2  puffs into the lungs every 6 (six) hours as needed  for wheezing or shortness of breath.   atorvastatin 40 MG tablet Commonly known as: LIPITOR Take 1 tablet (40 mg total) by mouth daily at 6 PM.   carvedilol 6.25 MG tablet Commonly known as: COREG Take 1 tablet (6.25 mg total) by mouth 2 (two) times daily. What changed:   medication strength  how much to take   digoxin 0.125 MG tablet Commonly known as: LANOXIN Take 1 tablet (0.125 mg total) by mouth daily. Notes to [name]: 04/03/19   DULoxetine 30 MG capsule Commonly known as: CYMBALTA Take 1 capsule (30 mg total) by mouth daily. Notes to [name]: 04/03/19   empagliflozin 10 MG Tabs tablet Commonly known as: JARDIANCE Take 10 mg by mouth daily before breakfast.   ferrous sulfate 325 (65 FE) MG tablet Take 1 tablet (325 mg total) by mouth every other day. Start taking on: April 04, 2019 Notes to [name]: 04/04/19   gabapentin 100 MG capsule Commonly known as: NEURONTIN Take 1 capsule (100 mg total) by mouth every 8 (eight) hours.   glucose blood test strip Commonly known as: AgaMatrix Presto Test Use as instructed   Accu-Chek Aviva Plus test strip Generic drug: glucose blood Use as instructed   hydrOXYzine 10 MG tablet Commonly known as: ATARAX/VISTARIL Take 1 tablet (10 mg total) by mouth 3 (three) times daily as needed for anxiety.   potassium chloride SA 20 MEQ tablet Commonly known as: K-DUR Take 1 tablet (20 mEq total) by mouth daily. Notes to [name]: 04/03/19   sacubitril-valsartan 24-26 MG Commonly known as: ENTRESTO Take 1 tablet by mouth 2 (two) times daily.   spironolactone 25 MG tablet Commonly known as: ALDACTONE Take 1 tablet (25 mg total) by mouth daily. Notes to [name]: 04/03/19   torsemide 20 MG tablet Commonly known as: DEMADEX Take 2 tablets (40 mg total) by mouth daily. Notes to [name]: 04/03/19   traZODone 50 MG tablet Commonly known as: DESYREL Take 1 tablet (50 mg total) by mouth at bedtime as needed for sleep.   warfarin 7.5  MG tablet Commonly known as: COUMADIN Take 1 tablet (7.5 mg total) by mouth daily at 6 PM.       Discharge Instructions: Please refer to [name] Instructions section of EMR for full details.  [name] was counseled important signs and symptoms that should prompt return to medical care, changes in medications, dietary instructions, activity restrictions, and follow up appointments.   Follow-Up Appointments: Follow-up Information    [facility] Follow up on 04/05/2019.   Specialty: Cardiology Why: at 9:00AM for INR check  Contact information: [address], [place] Follow up on 04/11/2019.   Specialty: Cardiology Why: at [place] . Heart Failure Clinic locate on the 1st floor of [facility]. Contact information: [address] [id number] [place] [address] [place]. Go on 04/05/2019.   Specialty: Family Medicine Why: AT [place] information: [place] [address] [place] [address] [place] Follow up.   Why: Carles Collet, MD 04/03/2019, 6:44 PM PGY-1, [place]

## 2019-03-28 NOTE — Progress Notes (Signed)
Went to visit [name] upon spiritual consult, but she was not feeling well and felt tired.  I will follow-up with a visit tomorrow to see if she is able to talk.  Page me as needed [phone])

## 2019-03-28 NOTE — Progress Notes (Signed)
  Echocardiogram 2D Echocardiogram has been performed.  [name] G [name] 03/28/2019, 10:52 AM

## 2019-03-28 NOTE — Progress Notes (Signed)
Family Medicine Teaching Service Daily Progress Note Intern Pager: [phone]  Patient name: [name] Medical record number: [id number] Date of birth: March 03, 1976 Age: 43 y.o. Gender: female  Primary Care Provider: [name], DO Consultants: Cardiology Code Status: DNR  Pt Overview and Major Events to Date:  9/2- admitted  Assessment and Plan: Teashia Trzaska is a 43 yo female presenting with shortness of breath. PMH is significant for HFrEF with EF 20-25%, atrial fibrillation on Xarelto, HTN, T2DM, asthma, and MDD/anxiety  Acute on chronic HFrEF:EF20-25% echo June 2020 Patient still appears volume overloaded despite receiving 2 doses of furosemide 80 mg IV yesterday. Weight increased by 3#, but that could be due to difference in ED scale vs floor scale.  UOP yesterday 1.8L, likely excluding UOP in ED. Will continue to monitor. Her exam has slightly changed with pitting edema to knee today, which likely does not represent a decrease in fluid status, but due to changed positioning (no longer dependent in hospital bed). She still has a  distended abdomen today. She remains clear to auscultation bilaterally, but also tachypneic on exam to the 30s, able to speak in full sentences.Chest x-ray in the ED revealed atelectasis in the left mid lobe, but no infiltrateor pulmonary edema.Likely worsening of HFrEF due to concern for medication adherence and poor diet with excess sodium. EKG with normal sinus rhythm with T wave inversions likely indicating demand ischemia. We will consult cardiology during this admission and greatly appreciate their recommendations. - admit to cardiac telemetry, Attending Dr. Owens Shark - continueFurosemide 80 mg PO BID - Echo today - HOLDHCTZ, do not discharge on both HCTZ and furosemide - consider adding Entresto and spironolactone this admission - STRICT I/Os - Fluid restriction 1,500 mL - monitor daily weights - continuous pulse ox - continueAmlodipine 5 mg  PO qhs - continueCarvedilol 12.5 mg PO BID - consult cardiology, appreciate recommendations - AM lab BMP  - urine pregnancy test negative - supplement electrolytes as needed > 1x K-DUR 40 mEq  Pain in legs Patient describes a neuropathic pain, which is not like "when your foot falls asleep," but more like she is being "poked with something sharp" or "electric." She feels this pain throughout the day, but it is worst at night, and prevents her from sleeping. Tylenol does not help, and she would like treatment, so Gabapentin started at 100 mg PO TID and will titrate for effect. This is likely neuropathy secondary to DMT2. - start gabapentin 100 mg PO TID  AKI Serum cr down to 1.18 today. Patient had a serum cr of 1.28 on admission, a decrease from 1.52 from discharge 1 month ago. However, upon chart review, it appears that patient's baseline is closer to 0.8 when she is well. Creatinine likely a reflection of resolving AKI from her last admission. We will monitor with daily labs.  - daily BMP - avoid nephrotoxic agents - hold HCTZ  Paroxysmal a fib CHA2DS2-VASc score is at least 4, will continue home anticoagulation - continuerivaroxaban 20 mgdaily - continue home carvedilol  DMT2 Blood glucose on admission was 194. Overnight BG ranged from 99-113. Will hold lantus for now and continue to monitor on sensitive SSI. A1c>15.5 on 02/06/19.  - continue SSI, will add Lantus depending on CBGs - will restart statin prior to discharge home now that patient no longer wishes to conceive  Microcytic Anemia Patient has a history of menorrhagia and H/H of 10.6/33.8 with MCV 66.7 today. Patient states that she has not had any abnormal  bleeding since May of this year. Expect with diuresis, her H/H should improve somewhat, but MCV reveals more chronic process, likely related to menstrual cycle and decreased Fe intake. Will continue to monitor and discuss starting Fe supplement and/or iron  studies. - monitor AM CBC  Medication adherence Patient does not have insurance and there is concern for her ability to access medication in outpatient setting. Will consult social work and appreciate recommendations for how best to assist this patient, as disposition, long term wellness, and prevention of readmission is dependent upon her adherence to medication regimen. - consult SW, appreciate recommendations  HTN Hypertensive on admission.Managed with amlodipine, HCTZ, and carvedilol. - continueamlodipine and carvedilol, hold HCTZ  OSA Pt does not use CPAP at home due to intolerance. May offer it to her if she prefers while in the hospital.  Asthma Patient is not actively wheezing. - may continueVentolin 2 puff q6h PRNif needed  Anxiety Managed with hydroxyzine and trazodone. - continue home medications. - will discuss options for outpatient treatment as part of her disposition care. - Consult social work for resources  FEN/GI: heart healthy diet, fluid restriction 1,500 mL PPx: Rivaroxaban 20 mg  Disposition: To home when fluid status improves. Appreciate SW recommendations on access to medications and resources for mental health.  Subjective:  [name] does not complain of SOB today, but is still tachypneic on exam to 30s. She is able to speak in full sentences, lung exam remains clear. Pitting edema to just below her knees today, a change from yesterday, but she is no longer sitting in a dependent position as much in the hospital bed. Her volume status is about the same as yesterday after 2 doses of furosemide 80 mg IV. Charted weight increase by 3 lbs likely to be due to difference between ED scale and floor scale. She complains today of pain in her legs, which does not feel like "when your foot falls asleep," but like "electricity" or "being poked with something sharp."   Objective: Temp:  [97.3 F (36.3 C)-98.4 F (36.9 C)] 98.3 F (36.8 C) (09/03 1121) Pulse  Rate:  [85-99] 88 (09/03 1121) Resp:  [12-20] 17 (09/03 1121) BP: (116-145)/(92-119) 120/92 (09/03 1121) SpO2:  [96 %-100 %] 100 % (09/03 1121) Weight:  [101.3 kg-102.6 kg] 102.6 kg (09/03 [id number]) Physical Exam: General: well-appearing, NAD Cardiovascular: RRR, no m/r/g Respiratory: CTAB, no accessory muscles required for respiratory effort, tachypneic, no wheezing, crackles Abdomen: soft, NT, distended  Extremities: pitting edema 2+ present to just below knees in bilateral LEs Neuro: mood appears down, affect flat  Laboratory: Recent Labs  Lab 03/27/19 1301 03/28/19 0523  WBC 7.8 6.9  HGB 10.5* 10.6*  HCT 34.7* 33.8*  PLT 276 268   Recent Labs  Lab 03/27/19 1301 03/28/19 0523  NA 137 136  K 3.5 3.3*  CL 101 99  CO2 24 25  BUN 19 17  CREATININE 1.28* 1.18*  CALCIUM 8.4* 8.2*  GLUCOSE 174* 113*   BNP 711  Imaging/Diagnostic Tests: Dg Chest Port 1 View  Result Date: 03/27/2019 CLINICAL DATA:  Chest pain and shortness of breath EXAM: PORTABLE CHEST 1 VIEW COMPARISON:  02/19/2019 FINDINGS: Cardiac shadow remains enlarged but stable. The lungs are well aerated bilaterally. No focal infiltrate or sizable effusion is seen. Minimal platelike atelectasis is noted in the left mid lung. IMPRESSION: Minimal platelike atelectasis on the left. Electronically Signed   By: Inez Catalina M.D.   On: 03/27/2019 13:55    Gladys Damme,  MD 03/28/2019, 11:42 AM PGY-1, [place] Intern pager: [phone], text pages welcome

## 2019-03-28 NOTE — Progress Notes (Signed)
Orthopedic Tech Progress Note Patient Details:  [name] 1976-06-30 [id number] Washed, did a little small massage and added lotion then applied unna boots and new socks Ortho Devices Type of Ortho Device: Haematologist Ortho Device/Splint Location: bilateral Ortho Device/Splint Interventions: Adjustment, Application, Ordered   Post Interventions Patient Tolerated: Well Instructions Provided: Care of device, Adjustment of device   Janit Pagan 03/28/2019, 12:43 PM

## 2019-03-29 ENCOUNTER — Encounter (HOSPITAL_COMMUNITY)
Admission: EM | Disposition: A | Discharge status: Home Health | Payer: Self-pay | Source: Home / Self Care | Attending: Family Medicine

## 2019-03-29 DIAGNOSIS — E785 Hyperlipidemia, unspecified: Secondary | ICD-10-CM

## 2019-03-29 DIAGNOSIS — E1169 Type 2 diabetes mellitus with other specified complication: Secondary | ICD-10-CM

## 2019-03-29 DIAGNOSIS — R109 Unspecified abdominal pain: Secondary | ICD-10-CM

## 2019-03-29 DIAGNOSIS — I5043 Acute on chronic combined systolic (congestive) and diastolic (congestive) heart failure: Secondary | ICD-10-CM

## 2019-03-29 DIAGNOSIS — I509 Heart failure, unspecified: Secondary | ICD-10-CM

## 2019-03-29 HISTORY — PX: RIGHT/LEFT HEART CATH AND CORONARY ANGIOGRAPHY: CATH118266

## 2019-03-29 LAB — POCT I-STAT EG7
Acid-Base Excess: 4 mmol/L — ABNORMAL HIGH (ref 0.0–2.0)
Acid-Base Excess: 6 mmol/L — ABNORMAL HIGH (ref 0.0–2.0)
Bicarbonate: 29.6 mmol/L — ABNORMAL HIGH (ref 20.0–28.0)
Bicarbonate: 31.3 mmol/L — ABNORMAL HIGH (ref 20.0–28.0)
Calcium, Ion: 1.13 mmol/L — ABNORMAL LOW (ref 1.15–1.40)
Calcium, Ion: 1.19 mmol/L (ref 1.15–1.40)
HCT: 39 % (ref 36.0–46.0)
HCT: 40 % (ref 36.0–46.0)
Hemoglobin: 13.3 g/dL (ref 12.0–15.0)
Hemoglobin: 13.6 g/dL (ref 12.0–15.0)
O2 Saturation: 56 %
O2 Saturation: 57 %
Potassium: 3.7 mmol/L (ref 3.5–5.1)
Potassium: 3.9 mmol/L (ref 3.5–5.1)
Sodium: 137 mmol/L (ref 135–145)
Sodium: 139 mmol/L (ref 135–145)
TCO2: 31 mmol/L (ref 22–32)
TCO2: 33 mmol/L — ABNORMAL HIGH (ref 22–32)
pCO2, Ven: 46.6 mmHg (ref 44.0–60.0)
pCO2, Ven: 48.5 mmHg (ref 44.0–60.0)
pH, Ven: 7.411 (ref 7.250–7.430)
pH, Ven: 7.418 (ref 7.250–7.430)
pO2, Ven: 29 mmHg — CL (ref 32.0–45.0)
pO2, Ven: 30 mmHg — CL (ref 32.0–45.0)

## 2019-03-29 LAB — BASIC METABOLIC PANEL
Anion gap: 11 (ref 5–15)
BUN: 17 mg/dL (ref 6–20)
CO2: 26 mmol/L (ref 22–32)
Calcium: 8.7 mg/dL — ABNORMAL LOW (ref 8.9–10.3)
Chloride: 97 mmol/L — ABNORMAL LOW (ref 98–111)
Creatinine, Ser: 1.01 mg/dL — ABNORMAL HIGH (ref 0.44–1.00)
GFR calc Af Amer: >60 mL/min (ref >60)
GFR calc non Af Amer: >60 mL/min (ref >60)
Glucose, Bld: 130 mg/dL — ABNORMAL HIGH (ref 70–99)
Potassium: 3.7 mmol/L (ref 3.5–5.1)
Sodium: 134 mmol/L — ABNORMAL LOW (ref 135–145)

## 2019-03-29 LAB — CBC
HCT: 36 % (ref 36.0–46.0)
Hemoglobin: 11 g/dL — ABNORMAL LOW (ref 12.0–15.0)
MCH: 20.4 pg — ABNORMAL LOW (ref 26.0–34.0)
MCHC: 30.6 g/dL (ref 30.0–36.0)
MCV: 66.8 fL — ABNORMAL LOW (ref 80.0–100.0)
Platelets: 277 10*3/uL (ref 150–400)
RBC: 5.39 MIL/uL — ABNORMAL HIGH (ref 3.87–5.11)
RDW: 21.9 % — ABNORMAL HIGH (ref 11.5–15.5)
WBC: 7.6 10*3/uL (ref 4.0–10.5)
nRBC: 0 % (ref 0.0–0.2)

## 2019-03-29 LAB — APTT
aPTT: 31 seconds (ref 24–36)
aPTT: 70 seconds — ABNORMAL HIGH (ref 24–36)

## 2019-03-29 LAB — HEPARIN LEVEL (UNFRACTIONATED): Heparin Unfractionated: 1.92 IU/mL — ABNORMAL HIGH (ref 0.30–0.70)

## 2019-03-29 LAB — GLUCOSE, CAPILLARY
Glucose-Capillary: 134 mg/dL — ABNORMAL HIGH (ref 70–99)
Glucose-Capillary: 111 mg/dL — ABNORMAL HIGH (ref 70–99)

## 2019-03-29 SURGERY — RIGHT/LEFT HEART CATH AND CORONARY ANGIOGRAPHY
Anesthesia: LOCAL

## 2019-03-29 MED ORDER — FENTANYL CITRATE (PF) 100 MCG/2ML IJ SOLN
INTRAMUSCULAR | Status: DC | PRN
  Administered 2019-03-29: 25 ug via INTRAVENOUS

## 2019-03-29 MED ORDER — DICYCLOMINE HCL 10 MG PO CAPS
10.0000 mg | ORAL_CAPSULE | Freq: Once | ORAL | Status: AC
  Administered 2019-03-29: 21:00:00 10 mg via ORAL
  Filled 2019-03-29: qty 1

## 2019-03-29 MED ORDER — HEPARIN SODIUM (PORCINE) 1000 UNIT/ML IJ SOLN
INTRAMUSCULAR | Status: DC | PRN
  Administered 2019-03-29: 5000 [IU] via INTRAVENOUS

## 2019-03-29 MED ORDER — VERAPAMIL HCL 2.5 MG/ML IV SOLN
INTRAVENOUS | Status: DC | PRN
  Administered 2019-03-29: 10 mL via INTRA_ARTERIAL

## 2019-03-29 MED ORDER — MIDAZOLAM HCL 2 MG/2ML IJ SOLN
INTRAMUSCULAR | Status: DC | PRN
  Administered 2019-03-29: 1 mg via INTRAVENOUS

## 2019-03-29 MED ORDER — LIDOCAINE HCL (PF) 1 % IJ SOLN
INTRAMUSCULAR | Status: AC
  Filled 2019-03-29: qty 30

## 2019-03-29 MED ORDER — HYDRALAZINE HCL 20 MG/ML IJ SOLN: 10.0000 mg | INTRAMUSCULAR | Status: AC | PRN

## 2019-03-29 MED ORDER — ONDANSETRON HCL 4 MG/2ML IJ SOLN: 4.0000 mg | Freq: Four times a day (QID) | INTRAMUSCULAR | Status: DC | PRN

## 2019-03-29 MED ORDER — SODIUM CHLORIDE 0.9 % IV SOLN: 250.0000 mL | INTRAVENOUS | Status: DC | PRN

## 2019-03-29 MED ORDER — SPIRONOLACTONE 25 MG PO TABS
25.0000 mg | ORAL_TABLET | Freq: Every day | ORAL | Status: DC
  Administered 2019-03-30 – 2019-04-02 (×4): 25 mg via ORAL
  Filled 2019-03-29 (×4): qty 1

## 2019-03-29 MED ORDER — IOHEXOL 350 MG/ML SOLN
INTRAVENOUS | Status: DC | PRN
  Administered 2019-03-29: 17:00:00 55 mL via INTRA_ARTERIAL

## 2019-03-29 MED ORDER — DULOXETINE HCL 30 MG PO CPEP
30.0000 mg | ORAL_CAPSULE | Freq: Every day | ORAL | Status: DC
  Administered 2019-03-29 – 2019-04-02 (×5): 30 mg via ORAL
  Filled 2019-03-29 (×5): qty 1

## 2019-03-29 MED ORDER — ACETAMINOPHEN 325 MG PO TABS: 650.0000 mg | ORAL_TABLET | ORAL | Status: DC | PRN

## 2019-03-29 MED ORDER — HEPARIN BOLUS VIA INFUSION
1000.0000 [IU] | Freq: Once | INTRAVENOUS | Status: AC
  Administered 2019-03-29: 1000 [IU] via INTRAVENOUS
  Filled 2019-03-29: qty 1000

## 2019-03-29 MED ORDER — SPIRONOLACTONE 12.5 MG HALF TABLET
12.5000 mg | ORAL_TABLET | Freq: Once | ORAL | Status: AC
  Administered 2019-03-29: 11:00:00 12.5 mg via ORAL
  Filled 2019-03-29: qty 1

## 2019-03-29 MED ORDER — HEPARIN (PORCINE) 25000 UT/250ML-% IV SOLN
900.0000 [IU]/h | INTRAVENOUS | Status: DC
  Administered 2019-03-30: 1150 [IU]/h via INTRAVENOUS
  Administered 2019-03-30: 01:00:00 1000 [IU]/h via INTRAVENOUS
  Administered 2019-03-31: 1050 [IU]/h via INTRAVENOUS
  Filled 2019-03-29 (×3): qty 250

## 2019-03-29 MED ORDER — VERAPAMIL HCL 2.5 MG/ML IV SOLN
INTRAVENOUS | Status: AC
  Filled 2019-03-29: qty 2

## 2019-03-29 MED ORDER — SODIUM CHLORIDE 0.9% FLUSH: 3.0000 mL | INTRAVENOUS | Status: DC | PRN

## 2019-03-29 MED ORDER — SODIUM CHLORIDE 0.9% FLUSH
3.0000 mL | Freq: Two times a day (BID) | INTRAVENOUS | Status: DC
  Administered 2019-03-31 – 2019-04-02 (×3): 3 mL via INTRAVENOUS

## 2019-03-29 MED ORDER — LIDOCAINE HCL (PF) 1 % IJ SOLN
INTRAMUSCULAR | Status: DC | PRN
  Administered 2019-03-29 (×2): 2 mL

## 2019-03-29 MED ORDER — HEPARIN (PORCINE) IN NACL 1000-0.9 UT/500ML-% IV SOLN
INTRAVENOUS | Status: DC | PRN
  Administered 2019-03-29 (×2): 500 mL

## 2019-03-29 MED ORDER — ACETAMINOPHEN 325 MG PO TABS
650.0000 mg | ORAL_TABLET | ORAL | Status: DC | PRN
  Administered 2019-03-29: 650 mg via ORAL
  Filled 2019-03-29: qty 2

## 2019-03-29 MED ORDER — HEPARIN (PORCINE) IN NACL 1000-0.9 UT/500ML-% IV SOLN
INTRAVENOUS | Status: AC
  Filled 2019-03-29: qty 1000

## 2019-03-29 MED ORDER — WARFARIN - PHARMACIST DOSING INPATIENT
Freq: Every day | Status: DC
  Administered 2019-03-30 – 2019-04-01 (×3)

## 2019-03-29 MED ORDER — MIDAZOLAM HCL 2 MG/2ML IJ SOLN
INTRAMUSCULAR | Status: AC
  Filled 2019-03-29: qty 2

## 2019-03-29 MED ORDER — DIGOXIN 125 MCG PO TABS
0.1250 mg | ORAL_TABLET | Freq: Every day | ORAL | Status: DC
  Administered 2019-03-29 – 2019-04-02 (×5): 0.125 mg via ORAL
  Filled 2019-03-29 (×5): qty 1

## 2019-03-29 MED ORDER — LABETALOL HCL 5 MG/ML IV SOLN: 10.0000 mg | INTRAVENOUS | Status: AC | PRN

## 2019-03-29 MED ORDER — FUROSEMIDE 10 MG/ML IJ SOLN
12.0000 mg/h | INTRAVENOUS | Status: DC
  Administered 2019-03-29 – 2019-03-31 (×3): 12 mg/h via INTRAVENOUS
  Filled 2019-03-29 (×2): qty 21
  Filled 2019-03-29 (×2): qty 25

## 2019-03-29 MED ORDER — WARFARIN SODIUM 10 MG PO TABS
10.0000 mg | ORAL_TABLET | Freq: Once | ORAL | Status: AC
  Administered 2019-03-29: 10 mg via ORAL
  Filled 2019-03-29: qty 1

## 2019-03-29 MED ORDER — FENTANYL CITRATE (PF) 100 MCG/2ML IJ SOLN
INTRAMUSCULAR | Status: AC
  Filled 2019-03-29: qty 2

## 2019-03-29 MED ORDER — METOLAZONE 2.5 MG PO TABS
2.5000 mg | ORAL_TABLET | Freq: Once | ORAL | Status: AC
  Administered 2019-03-29: 2.5 mg via ORAL
  Filled 2019-03-29: qty 1

## 2019-03-29 SURGICAL SUPPLY — 14 items
CATH 5FR JL3.5 JR4 ANG PIG MP (CATHETERS) ×1 IMPLANT
CATH BALLN WEDGE 5F 110CM (CATHETERS) ×1 IMPLANT
CATH INFINITI 5 FR 3DRC (CATHETERS) ×1 IMPLANT
CATH LAUNCHER 5F JL3 (CATHETERS) IMPLANT
CATHETER LAUNCHER 5F JL3 (CATHETERS) ×2
DEVICE RAD COMP TR BAND LRG (VASCULAR PRODUCTS) ×1 IMPLANT
GLIDESHEATH SLEND SS 6F .021 (SHEATH) ×1 IMPLANT
GUIDEWIRE INQWIRE 1.5J.035X260 (WIRE) IMPLANT
INQWIRE 1.5J .035X260CM (WIRE) ×2
KIT HEART LEFT (KITS) ×1 IMPLANT
PACK CARDIAC CATHETERIZATION (CUSTOM PROCEDURE TRAY) ×2 IMPLANT
SHEATH GLIDE SLENDER 4/5FR (SHEATH) ×1 IMPLANT
TRANSDUCER W/STOPCOCK (MISCELLANEOUS) ×2 IMPLANT
WIRE HI TORQ VERSACORE-J 145CM (WIRE) ×1 IMPLANT

## 2019-03-29 NOTE — Plan of Care (Signed)

## 2019-03-29 NOTE — Progress Notes (Signed)
[place] for heparin Indication: LV Thrombus  Allergies  Allergen Reactions  . Shellfish Allergy Swelling    Airway involvement including EMS - Has Epi-Pen  . Metformin And Related Diarrhea    Patient Measurements: Height: 4\' 9"  (144.8 cm) Weight: 226 lb 3.1 oz (102.6 kg) IBW/kg (Calculated) : 38.6 Heparin Dosing Weight: 65kg  Vital Signs: Temp: 97.5 F (36.4 C) (09/03 1950) Temp Source: Oral (09/03 1950) BP: 122/93 (09/03 1950) Pulse Rate: 86 (09/03 1950)  Labs: Recent Labs    03/27/19 1301 03/27/19 1501 03/28/19 0523 03/28/19 1725 03/29/19 0014  HGB 10.5*  --  10.6*  --   --   HCT 34.7*  --  33.8*  --   --   PLT 276  --  268  --   --   APTT  --   --   --   --  31  CREATININE 1.28*  --  1.18* 1.16*  --   TROPONINIHS 14 15  --   --   --     Estimated Creatinine Clearance: 64.7 mL/min (A) (by C-G formula based on SCr of 1.16 mg/dL (H)).   Medical History: Past Medical History:  Diagnosis Date  . Abnormal Pap smear   . Anemia   . Asthma   . Atrial fibrillation ([place])   . Diabetes mellitus   . Heart failure with reduced ejection fraction ([place])   . Hypertension   . 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    . Migraines   . Morbid obesity ([place]) 02/06/2019  . Obstructive sleep apnea 06/30/2015  . Polycystic ovarian disease     Assessment: 23 yoF admitted with SOB and new LV thrombus. Pt on Xarelto PTA for hx PAF, pharmacy to transition to heparin drip with cath scheduled for tomorrow. Last dose of Xarelto was 9/2 ~1830, will start heparin tonight at 1830 with no bolus. Will dose via aPTTs initially while DOAC clears. Initial aPTT 31 sec  Goal of Therapy:  Heparin level 0.3-0.7 units/ml aPTT 66-102 seconds Monitor platelets by anticoagulation protocol: Yes   Plan:  Heparin 1000 unit bolus and increase drip to  1200 units/hr Daily aPTT and heparin level Follow CBC and S/Sx bleeding Thanks for allowing pharmacy to be a part of this patient's care.  Excell Seltzer, PharmD Clinical Pharmacist 03/29/2019

## 2019-03-29 NOTE — Progress Notes (Signed)
Patient ID: [name], female   DOB: 1976-03-14, 43 y.o.   MRN: [id number]     Advanced Heart Failure Rounding Note  PCP-Cardiologist: No primary care provider on file.   Subjective:    Good diuresis yesterday, weight down.  Breathing improving.    Objective:   Weight Range: 99.5 kg Body mass index is 47.46 kg/m.   Vital Signs:   Temp:  [97.5 F (36.4 C)-98.3 F (36.8 C)] 98 F (36.7 C) (09/04 0917) Pulse Rate:  [83-88] 86 (09/04 0917) Resp:  [17-18] 18 (09/04 0917) BP: (113-124)/(79-96) 124/96 (09/04 0851) SpO2:  [97 %-100 %] 100 % (09/04 0917) Weight:  [99.5 kg] 99.5 kg (09/04 0516) Last BM Date: 03/27/19  Weight change: Filed Weights   03/27/19 2034 03/28/19 0635 03/29/19 0516  Weight: 101.3 kg 102.6 kg 99.5 kg    Intake/Output:   Intake/Output Summary (Last 24 hours) at 03/29/2019 1012 Last data filed at 03/29/2019 0517 Gross per 24 hour  Intake 1374.61 ml  Output 3706 ml  Net -2331.39 ml      Physical Exam    General:  Well appearing. No resp difficulty HEENT: Normal Neck: Supple. JVP 14-16 cm. Carotids 2+ bilat; no bruits. No lymphadenopathy or thyromegaly appreciated. Cor: PMI nonpalpable. Regular rate & rhythm. No rubs, gallops or murmurs. Lungs: Clear Abdomen: Soft, nontender, nondistended. No hepatosplenomegaly. No bruits or masses. Good bowel sounds. Extremities: No cyanosis, clubbing, rash.  1+ edema 1/2 to knees, legs wrapped.  Neuro: Alert & orientedx3, cranial nerves grossly intact. moves all 4 extremities w/o difficulty. Affect pleasant   Telemetry   NSR 80s (personally reviewed)  Labs    CBC Recent Labs    03/28/19 0523 03/29/19 0511  WBC 6.9 7.6  HGB 10.6* 11.0*  HCT 33.8* 36.0  MCV 66.7* 66.8*  PLT 268 [id number]   Basic Metabolic Panel Recent Labs    03/28/19 1725 03/29/19 0511  NA 135 134*  K 4.2 3.7  CL 98 97*  CO2 25 26  GLUCOSE 111* 130*  BUN 17 17  CREATININE 1.16* 1.01*  CALCIUM 8.7* 8.7*   Liver Function  Tests No results for input(s): AST, ALT, ALKPHOS, BILITOT, PROT, ALBUMIN in the last 72 hours. No results for input(s): LIPASE, AMYLASE in the last 72 hours. Cardiac Enzymes No results for input(s): CKTOTAL, CKMB, CKMBINDEX, TROPONINI in the last 72 hours.  BNP: BNP (last 3 results) Recent Labs    12/25/18 1026 02/19/19 2026 03/27/19 1302  BNP 401.2* 654.7* 711.2*    ProBNP (last 3 results) No results for input(s): PROBNP in the last 8760 hours.   D-Dimer No results for input(s): DDIMER in the last 72 hours. Hemoglobin A1C No results for input(s): HGBA1C in the last 72 hours. Fasting Lipid Panel No results for input(s): CHOL, HDL, LDLCALC, TRIG, CHOLHDL, LDLDIRECT in the last 72 hours. Thyroid Function Tests No results for input(s): TSH, T4TOTAL, T3FREE, THYROIDAB in the last 72 hours.  Invalid input(s): FREET3  Other results:   Imaging     No results found.   Medications:     Scheduled Medications: . atorvastatin  40 mg Oral q1800  . carvedilol  12.5 mg Oral BID  . furosemide  80 mg Intravenous BID  . gabapentin  100 mg Oral Q8H  . insulin aspart  0-9 Units Subcutaneous TID WC  . metolazone  2.5 mg Oral Once  . sacubitril-valsartan  1 tablet Oral BID  . sodium chloride flush  3 mL Intravenous Q12H  .  sodium chloride flush  3 mL Intravenous Q12H  . [START ON 03/30/2019] spironolactone  25 mg Oral Daily     Infusions: . sodium chloride    . sodium chloride    . sodium chloride 10 mL/hr at 03/29/19 0426  . heparin 1,200 Units/hr (03/29/19 0126)     PRN Medications:  sodium chloride, sodium chloride, acetaminophen, hydrOXYzine, sodium chloride flush, sodium chloride flush, traZODone   Assessment/Plan   1. Acute on chronic systolic CHF: Diagnosed in 6/20.  Echo this admission shows EF 20-25%, diffuse hypokinesis, LV thrombus, moderate RV systolic dysfunction, mild MR, moderate TR.  She has a history of paroxysmal atrial fibrillation but seems to be  mostly in sinus rhythm.  This is not likely to be a tachycardia-mediated cardiomyopathy.  Poorly controlled diabetes itself can cause a cardiomyopathy, as can poorly controlled HTN.  Myocarditis is a potential cause of her cardiomyopathy. With poorly controlled DM2 and HTN as well as hyperlipidemia, CAD is a concern.  She also reports chest heaviness worsening with exertion, though Hs-TnI was negative this admission.  She diuresed reasonably well yesterday, weight down.  Still volume overloaded on exam. - Lasix 80 mg IV bid again today with metolazone 2.5 daily.  - Continue Entresto 24/26 bid.  - Increase spironolactone to 25 mg daily.  - Continue Coreg 12.5 mg bid for now.  - Continue unna boots.  - She has been trying to get pregnant (has been difficult with polycystic ovarian syndrome).  She has 2 grown children already.  We discussed this.  With her cardiomyopathy and active CHF, pregnancy would create a significant mortality risk for her.  A number of cardiac meds that she would ideally be on to control her CHF are feto-toxic.  I recommended that she NOT try to get pregnant at this point and use a form of birth control.  She agreed with this.  - As above, need to rule out coronary disease as a potentially reversible etiology for her cardiomyopathy  She will stop heparin briefly this afternoon for RHC/LHC then restart heparin after cath. We discussed risks/benefits and she agrees to procedure.  2. LV thrombus: LV thrombus noted on echo this admission.  She says that she has been compliant with Xarelto.   - Xarelto has been stopped, she will need to transition to warfarin. She is on heparin gtt currently, will start warfarin this evening after cath.  3. Atrial fibrillation: Paroxysmal. She has been in NSR this admission.  - Switching from Xarelto to warfarin with LV thrombus.  - Would consider atrial fibrillation ablation in the future given CASTLE-HF data.  4. Type 2 diabetes: Poor control, per  primary service.  5. Hypertension: Med changes as above.     Length of Stay: 2  Loralie Champagne, MD  03/29/2019, 10:12 AM  Advanced Heart Failure Team Pager [phone] (M-F; 7a - 4p)  Please contact [facility] for night-coverage after hours (4p -7a ) and weekends on [email or web address]

## 2019-03-29 NOTE — Progress Notes (Signed)
Family Medicine Teaching Service Daily Progress Note Intern Pager: [phone]  Patient name: [name] Medical record number: [id number] Date of birth: [birth year] Age: 43 y.o. Gender: female  Primary Care Provider: [name], DO Consultants: Cardiology Code Status: DNR  Pt Overview and Major Events to Date:  9/2- admitted 9/4- [place] cath today  Assessment and Plan: [name] is a 43 yo female presenting with shortness of breath. PMH is significant for HFrEF with EF 20-25%, atrial fibrillation on Xarelto, HTN, T2DM, asthma, and MDD/anxiety  Acute on chronic HFrEF:EF20-25% echo June 2020 Patient is improved today. Unna boots on, so cannot assess LE very well. Objectively she lost 6.9 lbs, down to 226.2 lbs. UOP 3.7L. Cardiology is taking for [place] cath today for thrombus in apex found on echo yesterday. - admit to cardiac telemetry, Attending Dr. Owens Shark - continueFurosemide 80 mg PO BID - Cath today - HOLDHCTZ, do not discharge on both HCTZ and furosemide - continue Entresto 24/26 PO qd -continue spironolactone 12.5 mg PO qd - STRICT I/Os - Fluid restriction 1,500 mL - monitor daily weights - continuous pulse ox - continueAmlodipine 5 mg PO qhs - continueCarvedilol 12.5 mg PO BID - consult cardiology, appreciate recommendations - AM lab BMP  - urine pregnancy test negative - supplement electrolytes as needed   Paroxysmal a fib Thrombus found in apex of heart yesterday. Going for [place] cath today. CHA2DS2-VASc score is at least 4. - Discontinuerivaroxaban 20 mgdaily - Start heparin bridge to warfarin per pharmacy consult - continue home carvedilol  MDD Patient has h/o of MDD and currently meets criteria. She is not on an anti-depressant, but would like to start one. - will start Duloxetine 30 mg qd x 1 wk, and will titrate up from there. Will not start until after cath.   AKI Serum cr down to 1.16 today. Patient had a serum cr of 1.28 on admission, a  decrease from 1.52 from discharge 1 month ago. Patient's baseline is closer to 0.8 when she is well. Creatinine likely a reflection of resolving AKI from her last admission. We will monitor with daily labs.  - daily BMP - avoid nephrotoxic agents - hold HCTZ  Pain in legs-resolved Patient reports that she is much improved today and that pain is much better.  - continue gabapentin 100 mg PO TID  DMT2 Overnight BG ranged from 113-175. Will hold lantus for now and continue to monitor on sensitive SSI. A1c>15.5 on 02/06/19.  - continue SSI, will add Lantus depending on CBGs - will restart statin prior to discharge home now that patient no longer wishes to conceive  Microcytic Anemia Patient has a history of menorrhagia and H/H of 10.6/33.8 with MCV 66.7 today. Patient states that she has not had any abnormal bleeding since May of this year. Expect with diuresis, her H/H should improve somewhat, but MCV reveals more chronic process, likely related to menstrual cycle and decreased Fe intake. Will continue to monitor and discuss starting Fe supplement and/or iron studies. - monitor AM CBC  Medication adherence Patient does not have insurance and there is concern for her ability to access medication in outpatient setting. Will consult social work and appreciate recommendations for how best to assist this patient, as disposition, long term wellness, and prevention of readmission is dependent upon her adherence to medication regimen. - consult SW, appreciate recommendations  HTN BP appropriate today at 117/90.Managed with amlodipine, HCTZ, and carvedilol. - continueamlodipine and carvedilol, hold HCTZ  OSA Pt does not use  CPAP at home due to intolerance. May offer it to her if she prefers while in the hospital.  Asthma Patient is not actively wheezing. - may continueVentolin 2 puff q6h PRNif needed  Anxiety Managed with hydroxyzine and trazodone. - continue home medications. -  will discuss options for outpatient treatment as part of her disposition care. - Consult social work for resources  FEN/GI: heart healthy diet, fluid restriction 1,500 mL PPx: Rivaroxaban 20 mg  Disposition: To home when fluid status improves. Appreciate SW recommendations on access to medications and resources for mental health.  Subjective:  [name] is doing much better today.  She states that her leg pain is much improved, she was able to sleep well last night and overall feels better.  She lost weight her volume output was appropriate for diuresis.  She is going for cardiac cath today, which she is nervous about.  She is really interested in starting something for her depression.  She also decided to be full code yesterday afternoon, and said she was just really fatigued from this chronic illness which is a new diagnosis for her.  Objective: Temp:  [97.5 F (36.4 C)-98.3 F (36.8 C)] 98 F (36.7 C) (09/04 0917) Pulse Rate:  [83-88] 86 (09/04 0917) Resp:  [17-20] 18 (09/04 0917) BP: (113-124)/(79-98) 124/96 (09/04 0851) SpO2:  [97 %-100 %] 100 % (09/04 0917) Weight:  [99.5 kg] 99.5 kg (09/04 0516) Physical Exam: General: well-appearing, NAD Cardiovascular: RRR, no m/r/g Respiratory: CTAB, no accessory muscles required for respiratory effort, tachypneic, no wheezing, crackles Abdomen: soft, NT, distended  Extremities: unable to assess 2/2 unna boots Neuro: mood appears mildly improved, affect worried  Laboratory: Recent Labs  Lab 03/27/19 1301 03/28/19 0523 03/29/19 0511  WBC 7.8 6.9 7.6  HGB 10.5* 10.6* 11.0*  HCT 34.7* 33.8* 36.0  PLT 276 268 277   Recent Labs  Lab 03/28/19 0523 03/28/19 1725 03/29/19 0511  NA 136 135 134*  K 3.3* 4.2 3.7  CL 99 98 97*  CO2 25 25 26   BUN 17 17 17   CREATININE 1.18* 1.16* 1.01*  CALCIUM 8.2* 8.7* 8.7*  GLUCOSE 113* 111* 130*   BNP 711  Imaging/Diagnostic Tests: Dg Chest Port 1 View  Result Date: 03/27/2019 CLINICAL DATA:   Chest pain and shortness of breath EXAM: PORTABLE CHEST 1 VIEW COMPARISON:  02/19/2019 FINDINGS: Cardiac shadow remains enlarged but stable. The lungs are well aerated bilaterally. No focal infiltrate or sizable effusion is seen. Minimal platelike atelectasis is noted in the left mid lung. IMPRESSION: Minimal platelike atelectasis on the left. Electronically Signed   By: Inez Catalina M.D.   On: 03/27/2019 13:55    Gladys Damme, MD 03/29/2019, 9:22 AM PGY-1, [place] Intern pager: [phone], text pages welcome

## 2019-03-29 NOTE — TOC Initial Note (Signed)
Transition of Care [facility]) - Initial/Assessment Note    Patient Details  Name: [name] MRN: [id number] Date of Birth: 1976/07/16  Transition of Care [facility]) CM/SW Contact:    Zenon Mayo, RN Phone Number: 03/29/2019, 9:25 AM  Clinical Narrative:                 From home with adult and minor children, she has a PCP , Dr. Criss Rosales that she would like to keep, she does not have insurance, and will need ast with meds at dc.  She does have a card on her that she can pay for her meds with when [facility] fills scripts.  Also she would like [facility] for CHF disease management.  [facility] is doing charity this week.  Referral made to [facility] with [facility] for [facility].  She will check to make sure patient qualifies for charity.   Expected Discharge Plan: [place] Barriers to Discharge: No Barriers Identified   Patient Goals and CMS Choice Patient states their goals for this hospitalization and ongoing recovery are:: to get better CMS Medicare.gov Compare Post Acute Care list provided to:: Patient Choice offered to / list presented to : Patient  Expected Discharge Plan and Services Expected Discharge Plan: [place] In-house Referral: NA Discharge Planning Services: CM Consult, Medication Assistance, [place] [facility] Post Acute Care Choice: [place] arrangements for the past 2 months: Apartment Expected Discharge Date: 03/29/19               DME Arranged: (NA)         HH Arranged: RN, Disease Management HH Agency: [name]) Date HH Agency Contacted: 03/29/19 [place]: [phone] Representative spoke with at [place]: [place] Arrangements/Services Living arrangements for the past 2 months: [place] with:: Adult Children, Minor Children Patient language and need for interpreter reviewed:: Yes Do you feel safe going back to the place where you live?: Yes      Need for Family Participation in  Patient Care: No (Comment) Care giver support system in place?: No (comment)   Criminal Activity/Legal Involvement Pertinent to Current Situation/Hospitalization: No - Comment as needed  Activities of Daily Living Home Assistive Devices/Equipment: None ADL Screening (condition at time of admission) Patient's cognitive ability adequate to safely complete daily activities?: Yes Is the patient deaf or have difficulty hearing?: No Does the patient have difficulty seeing, even when wearing glasses/contacts?: No Does the patient have difficulty concentrating, remembering, or making decisions?: No Patient able to express need for assistance with ADLs?: Yes Does the patient have difficulty dressing or bathing?: No Independently performs ADLs?: Yes (appropriate for developmental age) Does the patient have difficulty walking or climbing stairs?: Yes Weakness of Legs: Both Weakness of Arms/Hands: None  Permission Sought/Granted                  Emotional Assessment Appearance:: Appears stated age Attitude/Demeanor/Rapport: Gracious Affect (typically observed): Appropriate Orientation: : Oriented to Self, Oriented to Place, Oriented to  Time, Oriented to Situation Alcohol / Substance Use: Not Applicable Psych Involvement: No (comment)  Admission diagnosis:  Abdominal pain, unspecified abdominal location [R10.9] Chest pain, unspecified type [R07.9] Acute on chronic congestive heart failure, unspecified heart failure type [facility]) [I50.9] Patient Active Problem List   Diagnosis Date Noted  . Chest pain 03/27/2019  . Abdominal pain   . Acute on chronic congestive heart failure ([place])   . Attempting to  conceive 03/10/2019  . Musculoskeletal pain 03/10/2019  . Acute on chronic HFrEF (heart failure with reduced ejection fraction) ([place]) 02/19/2019  . Asthma   . Hyperglycemia   . Yeast infection 02/06/2019  . Right ventricular failure ([place]) 02/06/2019  . Morbid obesity ([place]) 02/06/2019  .  DKA, type 2 ([place]) 02/06/2019  . HFrEF (heart failure with reduced ejection fraction) ([place]) 12/31/2018  . Paroxysmal atrial fibrillation ([place]) 12/31/2018  . Anxiety 12/31/2018  . Nausea 12/27/2018  . Hyperlipidemia associated with type 2 diabetes mellitus ([place]) 10/30/2017  . Obstructive sleep apnea 06/30/2015  . Shortness of breath 06/29/2015  . Missed period 04/05/2013  . Anemia due to blood loss, chronic 06/27/2012  . Allergic rhinitis 06/27/2012  . Major depressive disorder 02/08/2012  . Hypertension associated with diabetes ([place]) 12/04/2011  . Dysmenorrhea 11/22/2011  . DM2 (diabetes mellitus, type 2) ([place]) 05/19/2011  . PCOS (polycystic ovarian syndrome) 05/19/2011  . Hydrosalpinx 08/21/2009   PCP:  [name], DO Pharmacy:   [facility], [address] [place] [place] [address] Phone: [phone] Fax: [phone]  [facility] [phone] [place], [place] [address] Phone: [phone] Fax: [phone]  [facility] [place], [place] - [address] [place] [address] Phone: [phone] Fax: [place], [place] [address] Phone: [phone] Fax: [phone]  [place], [place] [address] [place] [address] Phone: [phone] Fax: [place], [place] - [address] [facility]. [address] [place] [address] Phone: [phone] Fax: [phone]     Social Determinants of Health (SDOH) Interventions    Readmission Risk Interventions No flowsheet data found.

## 2019-03-29 NOTE — Progress Notes (Signed)
[place] for heparin Indication: LV Thrombus  Allergies  Allergen Reactions  . Shellfish Allergy Swelling    Airway involvement including EMS - Has Epi-Pen  . Metformin And Related Diarrhea    Patient Measurements: Height: 4\' 9"  (144.8 cm) Weight: 219 lb 4.8 oz (99.5 kg) IBW/kg (Calculated) : 38.6 Heparin Dosing Weight: 65kg  Vital Signs: Temp: 97.9 F (36.6 C) (09/04 1825) Temp Source: Oral (09/04 1825) BP: 121/90 (09/04 1832) Pulse Rate: 78 (09/04 1832)  Labs: Recent Labs    03/27/19 1301 03/27/19 1501 03/28/19 0523 03/28/19 1725 03/29/19 0014 03/29/19 0511 03/29/19 0853 03/29/19 1651 03/29/19 1652  HGB 10.5*  --  10.6*  --   --  11.0*  --  13.6 13.3  HCT 34.7*  --  33.8*  --   --  36.0  --  40.0 39.0  PLT 276  --  268  --   --  277  --   --   --   APTT  --   --   --   --  31  --  70*  --   --   HEPARINUNFRC  --   --   --   --   --   --  1.92*  --   --   CREATININE 1.28*  --  1.18* 1.16*  --  1.01*  --   --   --   TROPONINIHS 14 15  --   --   --   --   --   --   --     Estimated Creatinine Clearance: 72.9 mL/min (A) (by C-G formula based on SCr of 1.01 mg/dL (H)).   Medical History: Past Medical History:  Diagnosis Date  . Abnormal Pap smear   . Anemia   . Asthma   . Atrial fibrillation ([place])   . Diabetes mellitus   . Heart failure with reduced ejection fraction ([place])   . Hypertension   . 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    . Migraines   . Morbid obesity ([place]) 02/06/2019  . Obstructive sleep apnea 06/30/2015  . Polycystic ovarian disease     Assessment: 67 yoF admitted with SOB and new LV thrombus. Patient was on Xarelto PTA (last dose 9/2 at 1830) for history of atrial fibrillation but transitioned to heparin drip on admission until cardiac cath. Now post-cath, pharmacy consulted to start IV  Heparin back 8 hours after sheath pull and begin Warfarin therapy due to new LV thrombus.   Prior to cath - Heparin level remained elevated in setting of recent Xarelto at 1.92 and aPTT was therapeutic at 70. H/H has improved. Sheath was removed at 17:17 PM and TR band placed and RN deflating.   Goal of Therapy:  Heparin level 0.3-0.7 units/ml aPTT 66-102 seconds Monitor platelets by anticoagulation protocol: Yes    Plan:  At 0100 AM (8 hours after sheath pull), restart IV Heparin at 1000 units/hr.  Recheck aPTT and heparin level in 6 hours.  Warfarin 10 mg po x1 tonight.  Daily INR as well as aPTT and HL until correlating.  Follow CBC and S/Sx bleeding  Sloan Leiter, PharmD, BCPS, BCCCP Clinical Pharmacist Clinical phone 03/29/2019 until 10:30PM [phone] Please refer to [facility] for [place] numbers 03/29/2019 7:29 PM

## 2019-03-29 NOTE — Progress Notes (Signed)
[place] for heparin Indication: LV Thrombus  Allergies  Allergen Reactions  . Shellfish Allergy Swelling    Airway involvement including EMS - Has Epi-Pen  . Metformin And Related Diarrhea    Patient Measurements: Height: 4\' 9"  (144.8 cm) Weight: 219 lb 4.8 oz (99.5 kg) IBW/kg (Calculated) : 38.6 Heparin Dosing Weight: 65kg  Vital Signs: Temp: 98 F (36.7 C) (09/04 0917) Temp Source: Oral (09/04 0917) BP: 124/96 (09/04 0851) Pulse Rate: 86 (09/04 0917)  Labs: Recent Labs    03/27/19 1301 03/27/19 1501 03/28/19 0523 03/28/19 1725 03/29/19 0014 03/29/19 0511  HGB 10.5*  --  10.6*  --   --  11.0*  HCT 34.7*  --  33.8*  --   --  36.0  PLT 276  --  268  --   --  277  APTT  --   --   --   --  31  --   CREATININE 1.28*  --  1.18* 1.16*  --  1.01*  TROPONINIHS 14 15  --   --   --   --     Estimated Creatinine Clearance: 72.9 mL/min (A) (by C-G formula based on SCr of 1.01 mg/dL (H)).   Medical History: Past Medical History:  Diagnosis Date  . Abnormal Pap smear   . Anemia   . Asthma   . Atrial fibrillation ([place])   . Diabetes mellitus   . Heart failure with reduced ejection fraction ([place])   . Hypertension   . 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    . Migraines   . Morbid obesity ([place]) 02/06/2019  . Obstructive sleep apnea 06/30/2015  . Polycystic ovarian disease     Assessment: 17 yoF admitted with SOB and new LV thrombus. Pt on Xarelto PTA for hx PAF, pharmacy to transition to heparin drip with cath scheduled. Last dose of Xarelto was 9/2 ~1830.  Heparin level in process but will likely be elevated due to recent xarelto use. Aptt now at goal on 1200 units/hr. Will continue at current rate, planning cath today, will follow up afterwards.   Goal of Therapy:  Heparin level 0.3-0.7 units/ml aPTT 66-102  seconds Monitor platelets by anticoagulation protocol: Yes   Plan:  Continue Heparin at 1200 units/hr  Daily aPTT and heparin level Follow CBC and S/Sx bleeding  Erin Hearing PharmD., BCPS Clinical Pharmacist 03/29/2019 9:17 AM

## 2019-03-30 DIAGNOSIS — I5022 Chronic systolic (congestive) heart failure: Secondary | ICD-10-CM

## 2019-03-30 LAB — GLUCOSE, CAPILLARY
Glucose-Capillary: 115 mg/dL — ABNORMAL HIGH (ref 70–99)
Glucose-Capillary: 116 mg/dL — ABNORMAL HIGH (ref 70–99)
Glucose-Capillary: 145 mg/dL — ABNORMAL HIGH (ref 70–99)
Glucose-Capillary: 149 mg/dL — ABNORMAL HIGH (ref 70–99)
Glucose-Capillary: 152 mg/dL — ABNORMAL HIGH (ref 70–99)

## 2019-03-30 LAB — CBC
HCT: 36.7 % (ref 36.0–46.0)
Hemoglobin: 11.1 g/dL — ABNORMAL LOW (ref 12.0–15.0)
MCH: 20 pg — ABNORMAL LOW (ref 26.0–34.0)
MCHC: 30.2 g/dL (ref 30.0–36.0)
MCV: 66.1 fL — ABNORMAL LOW (ref 80.0–100.0)
Platelets: 270 10*3/uL (ref 150–400)
RBC: 5.55 MIL/uL — ABNORMAL HIGH (ref 3.87–5.11)
RDW: 21.8 % — ABNORMAL HIGH (ref 11.5–15.5)
WBC: 6.3 10*3/uL (ref 4.0–10.5)
nRBC: 0 % (ref 0.0–0.2)

## 2019-03-30 LAB — PROTIME-INR
INR: 1.4 — ABNORMAL HIGH (ref 0.8–1.2)
Prothrombin Time: 16.9 seconds — ABNORMAL HIGH (ref 11.4–15.2)

## 2019-03-30 LAB — BASIC METABOLIC PANEL
Anion gap: 12 (ref 5–15)
BUN: 13 mg/dL (ref 6–20)
CO2: 29 mmol/L (ref 22–32)
Calcium: 9.1 mg/dL (ref 8.9–10.3)
Chloride: 94 mmol/L — ABNORMAL LOW (ref 98–111)
Creatinine, Ser: 0.96 mg/dL (ref 0.44–1.00)
GFR calc Af Amer: >60 mL/min (ref >60)
GFR calc non Af Amer: >60 mL/min (ref >60)
Glucose, Bld: 114 mg/dL — ABNORMAL HIGH (ref 70–99)
Potassium: 3.8 mmol/L (ref 3.5–5.1)
Sodium: 135 mmol/L (ref 135–145)

## 2019-03-30 LAB — APTT
aPTT: 52 seconds — ABNORMAL HIGH (ref 24–36)
aPTT: 89 seconds — ABNORMAL HIGH (ref 24–36)

## 2019-03-30 LAB — HEPARIN LEVEL (UNFRACTIONATED)
Heparin Unfractionated: 0.97 IU/mL — ABNORMAL HIGH (ref 0.30–0.70)
Heparin Unfractionated: 0.99 IU/mL — ABNORMAL HIGH (ref 0.30–0.70)

## 2019-03-30 MED ORDER — WARFARIN SODIUM 10 MG PO TABS
10.0000 mg | ORAL_TABLET | Freq: Once | ORAL | Status: AC
  Administered 2019-03-30: 10 mg via ORAL
  Filled 2019-03-30: qty 1

## 2019-03-30 NOTE — Progress Notes (Signed)
[place] for heparin Indication: LV Thrombus  Allergies  Allergen Reactions  . Shellfish Allergy Swelling    Airway involvement including EMS - Has Epi-Pen  . Metformin And Related Diarrhea    Patient Measurements: Height: 4\' 9"  (144.8 cm) Weight: 211 lb 8 oz (95.9 kg) IBW/kg (Calculated) : 38.6 Heparin Dosing Weight: 65kg  Vital Signs: Temp: 97.4 F (36.3 C) (09/05 0515) Temp Source: Oral (09/05 0515) BP: 115/89 (09/05 0515) Pulse Rate: 78 (09/05 0515)  Labs: Recent Labs    03/27/19 1301 03/27/19 1501 03/28/19 0523 03/28/19 1725 03/29/19 0014 03/29/19 0511 03/29/19 0853 03/29/19 1651 03/29/19 1652  HGB 10.5*  --  10.6*  --   --  11.0*  --  13.6 13.3  HCT 34.7*  --  33.8*  --   --  36.0  --  40.0 39.0  PLT 276  --  268  --   --  277  --   --   --   APTT  --   --   --   --  31  --  70*  --   --   HEPARINUNFRC  --   --   --   --   --   --  1.92*  --   --   CREATININE 1.28*  --  1.18* 1.16*  --  1.01*  --   --   --   TROPONINIHS 14 15  --   --   --   --   --   --   --     Estimated Creatinine Clearance: 71.2 mL/min (A) (by C-G formula based on SCr of 1.01 mg/dL (H)).   Medical History: Past Medical History:  Diagnosis Date  . Abnormal Pap smear   . Anemia   . Asthma   . Atrial fibrillation ([place])   . Diabetes mellitus   . Heart failure with reduced ejection fraction ([place])   . Hypertension   . 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    . Migraines   . Morbid obesity ([place]) 02/06/2019  . Obstructive sleep apnea 06/30/2015  . Polycystic ovarian disease     Assessment: 60 yoF admitted with SOB and new LV thrombus. Patient was on Xarelto PTA (last dose 9/2 at 1830) for history of atrial fibrillation but transitioned to heparin drip on admission until cardiac cath. Now post-cath, pharmacy consulted to start IV  Heparin back 8 hours after sheath pull and begin Warfarin therapy due to new LV thrombus.   Prior to cath - Heparin level remained elevated in setting of recent Xarelto at 1.92 and aPTT was therapeutic at 70. H/H has improved. Sheath was removed at 1717  on 9/4 and TR band placed and RN deflating. Pt has received 1 x 10 mg of warfarin.   Todays HL is elevated at 0.97 on 1000 units/hr. HL is trending down from yesterdays level but is still affected by Xarelto as the aPTT is sub therapeutic at 52. INR is 1.4. H&H is stable at 11.1/36.7. plts are stable at 270. Given that the HL is trending down and the aPTT is slightly subtherapeutic, will increase infusion rate without a bolus.   Goal of Therapy:  Heparin level 0.3-0.7 units/ml aPTT 66-102 seconds INR 2-3 Monitor platelets by anticoagulation protocol: Yes    Plan:  Increase heparin infusion to 1150 (~3  units/kg) Recheck aPTT and heparin level in 6 hours.  Warfarin 10 mg po x 1 tonight.  Daily INR as well as aPTT and HL until correlating.  Follow CBC and S/Sx bleeding    Thank you,   Eddie Candle, PharmD PGY-1 Pharmacy Resident   Please refer to [facility] for [place] numbers

## 2019-03-30 NOTE — Progress Notes (Signed)
[place] for heparin Indication: LV Thrombus  Allergies  Allergen Reactions  . Shellfish Allergy Swelling    Airway involvement including EMS - Has Epi-Pen  . Metformin And Related Diarrhea    Patient Measurements: Height: 4\' 9"  (144.8 cm) Weight: 211 lb 8 oz (95.9 kg) IBW/kg (Calculated) : 38.6 Heparin Dosing Weight: 65kg  Vital Signs: Temp: 97.5 F (36.4 C) (09/05 1150) Temp Source: Oral (09/05 1150) BP: 112/91 (09/05 1149) Pulse Rate: 70 (09/05 1149)  Labs: Recent Labs    03/28/19 0523 03/28/19 1725  03/29/19 0511 03/29/19 0853 03/29/19 1651 03/29/19 1652 03/30/19 0629 03/30/19 1513  HGB 10.6*  --   --  11.0*  --  13.6 13.3 11.1*  --   HCT 33.8*  --   --  36.0  --  40.0 39.0 36.7  --   PLT 268  --   --  277  --   --   --  270  --   APTT  --   --    < >  --  70*  --   --  52* 89*  LABPROT  --   --   --   --   --   --   --  16.9*  --   INR  --   --   --   --   --   --   --  1.4*  --   HEPARINUNFRC  --   --   --   --  1.92*  --   --  0.97* 0.99*  CREATININE 1.18* 1.16*  --  1.01*  --   --   --  0.96  --    < > = values in this interval not displayed.    Estimated Creatinine Clearance: 74.9 mL/min (by C-G formula based on SCr of 0.96 mg/dL).   Medical History: Past Medical History:  Diagnosis Date  . Abnormal Pap smear   . Anemia   . Asthma   . Atrial fibrillation ([place])   . Diabetes mellitus   . Heart failure with reduced ejection fraction ([place])   . Hypertension   . 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    . Migraines   . Morbid obesity ([place]) 02/06/2019  . Obstructive sleep apnea 06/30/2015  . Polycystic ovarian disease     Assessment: 34 yoF admitted with SOB and new LV thrombus. Patient was on Xarelto PTA (last dose 9/2 at 1830) for history of atrial fibrillation but transitioned to heparin drip on  admission until cardiac cath. Now post-cath, pharmacy consulted to start IV Heparin back 8 hours after sheath pull and begin Warfarin therapy due to new LV thrombus.   Prior to cath - Heparin level remained elevated in setting of recent Xarelto at 1.92 and aPTT was therapeutic at 70. H/H has improved. Sheath was removed at 1717  on 9/4 and TR band placed and RN deflating. Pt has received 1 x 10 mg of warfarin.   Heparin level remains elevated as expected, aPTT now therapeutic.  Goal of Therapy:  Heparin level 0.3-0.7 units/ml aPTT 66-102 seconds INR 2-3 Monitor platelets by anticoagulation protocol: Yes    Plan:  Continue heparin infusion 1150 units/h Daily heparin level and aPTT  Arrie Senate, PharmD, BCPS Clinical Pharmacist [phone] Please check AMION for all [place] numbers 03/30/2019

## 2019-03-30 NOTE — Progress Notes (Signed)
Progress Note  Patient Name: [name] Date of Encounter: 03/30/2019  Primary Cardiologist:   No primary care provider on file.   Subjective   Denis SOB or pain.  Inpatient Medications    Scheduled Meds: . atorvastatin  40 mg Oral q1800  . carvedilol  12.5 mg Oral BID  . digoxin  0.125 mg Oral Daily  . DULoxetine  30 mg Oral Daily  . gabapentin  100 mg Oral Q8H  . insulin aspart  0-9 Units Subcutaneous TID WC  . sacubitril-valsartan  1 tablet Oral BID  . sodium chloride flush  3 mL Intravenous Q12H  . sodium chloride flush  3 mL Intravenous Q12H  . sodium chloride flush  3 mL Intravenous Q12H  . spironolactone  25 mg Oral Daily  . warfarin  10 mg Oral ONCE-1800  . Warfarin - Pharmacist Dosing Inpatient   Does not apply q1800   Continuous Infusions: . sodium chloride    . sodium chloride    . furosemide (LASIX) infusion 12 mg/hr (03/29/19 2142)  . heparin 1,150 Units/hr (03/30/19 1002)   PRN Meds: sodium chloride, sodium chloride, [COMPLETED] dicyclomine **FOLLOWED BY** acetaminophen, hydrOXYzine, ondansetron (ZOFRAN) IV, sodium chloride flush, sodium chloride flush, traZODone   Vital Signs    Vitals:   03/30/19 0515 03/30/19 1000 03/30/19 1149 03/30/19 1150  BP: 115/89 126/86 (!) 112/91   Pulse: 78 73 70   Resp: 18  19   Temp: (!) 97.4 F (36.3 C)   (!) 97.5 F (36.4 C)  TempSrc: Oral   Oral  SpO2: 100%  99%   Weight: 95.9 kg     Height:        Intake/Output Summary (Last 24 hours) at 03/30/2019 1206 Last data filed at 03/30/2019 1138 Gross per 24 hour  Intake 672.24 ml  Output 4350 ml  Net -3677.76 ml   Filed Weights   03/28/19 0635 03/29/19 0516 03/30/19 0515  Weight: 102.6 kg 99.5 kg 95.9 kg    Telemetry    NSR - Personally Reviewed  ECG    NA - Personally Reviewed  Physical Exam   GEN: No acute distress.   Neck: No  JVD Cardiac: RRR, no murmurs, rubs, or gallops.  Respiratory: Clear  to auscultation bilaterally. GI: Soft,  nontender, non-distended  MS:   Moderately severe leg edema; No deformity.  Right radial site without bleeding or bruising.  Neuro:  Nonfocal  Psych: Normal affect   Labs    Chemistry Recent Labs  Lab 03/28/19 1725 03/29/19 0511 03/29/19 1651 03/29/19 1652 03/30/19 0629  NA 135 134* 137 139 135  K 4.2 3.7 3.9 3.7 3.8  CL 98 97*  --   --  94*  CO2 25 26  --   --  29  GLUCOSE 111* 130*  --   --  114*  BUN 17 17  --   --  13  CREATININE 1.16* 1.01*  --   --  0.96  CALCIUM 8.7* 8.7*  --   --  9.1  GFRNONAA 58* >60  --   --  >60  GFRAA >60 >60  --   --  >60  ANIONGAP 12 11  --   --  12     Hematology Recent Labs  Lab 03/28/19 0523 03/29/19 0511 03/29/19 1651 03/29/19 1652 03/30/19 0629  WBC 6.9 7.6  --   --  6.3  RBC 5.07 5.39*  --   --  5.55*  HGB 10.6* 11.0*  13.6 13.3 11.1*  HCT 33.8* 36.0 40.0 39.0 36.7  MCV 66.7* 66.8*  --   --  66.1*  MCH 20.9* 20.4*  --   --  20.0*  MCHC 31.4 30.6  --   --  30.2  RDW 21.9* 21.9*  --   --  21.8*  PLT 268 277  --   --  270    Cardiac EnzymesNo results for input(s): TROPONINI in the last 168 hours. No results for input(s): TROPIPOC in the last 168 hours.   BNP Recent Labs  Lab 03/27/19 1302  BNP 711.2*     DDimer No results for input(s): DDIMER in the last 168 hours.   Radiology    No results found.  Cardiac Studies   CATH  Left Main  No significant disease.  Left Anterior Descending  Luminal irregularities.  Left Circumflex  Luminal irregularities.  Right Coronary Artery  Extensive luminal irregularities.   Right Heart Pressures RHC Procedural Findings: Hemodynamics (mmHg) RA mean 18 RV 48/23 PA 49/30, mean 36 PCWP mean 23  Oxygen saturations: PA 57% AO 100%  Cardiac Output (Fick) 3.92  Cardiac Index (Fick) 2.07 PVR 3.3 WU PAPI 1.05     Patient Profile     43 y.o. female with history of type 2 diabetes (poorly controlled, last hgbA1c > 15), poorly controlled HTN, hyperlipidemia, paroxysmal  atrial fibrillation, and chronic systolic CHF of uncertain etiology was admitted with CHF exacerbation. She has been found to have an LV thrombus  Assessment & Plan    ACUTE ON CHRONIC SYSTOLIC HF:   Spironolactone increased yesterday.  Right and left cath.  No CAD.  Evidence of RV dysfunction.   Excellent diuresis.  BP is tolerating this.      Continue current diuresis.   LV THROMBUS:  Switched to warfarin.  Continue heparin while titrating warfarin.    PAF:  NSR now.  Starting warfarin.  Xarelto stopped.     For questions or updates, please contact [place] Please consult [email or web address] for contact info under Cardiology/STEMI.   Signed, Minus Breeding, MD  03/30/2019, 12:06 PM

## 2019-03-30 NOTE — Progress Notes (Signed)
Family Medicine Teaching Service Daily Progress Note Intern Pager: [phone]  Patient name: [name] Medical record number: [id number] Date of birth: 1976/03/12 Age: 43 y.o. Gender: female  Primary Care Provider: [name], DO Consultants: Cardiology Code Status: DNR  Pt Overview and Major Events to Date:  9/2- admitted 9/4- [name] cath   Assessment and Plan: [name] is a 43 yo female presenting with shortness of breath. PMH is significant for HFrEF with EF 20-25%, atrial fibrillation on Xarelto, HTN, T2DM, asthma, and MDD/anxiety.  Acute on chronic HFrEF:EF20-25% echo June 2020 Improving. Weight is down 8 lbs in last 24 hours, 13 lbs since admission.  UOP is 2.7 L in last 24 hours, she is net down 5.9 L since admission.  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  Medications were changed by the heart failure team following these results. - digoxin 0.125 mg daily - furosemide gtt at 12 ml/hr - HOLDHCTZ, do not discharge on both HCTZ and furosemide - continue Entresto 24/26 mg BID - continue spironolactone 25 mg PO qd - strict I/Os - Fluid restriction 1,500 mL - monitor daily weights - continuecarvedilol 12.5 mg PO BID - consult cardiology, appreciate recommendations - daily BMP  Paroxysmal a fib Thrombus found in apex on echo while patient was reportedly on Xarelto, although adherence is questionable.  Currently being bridged with heparin with plans to transition her to warfarin.  INR is 1.4 on 9/5.  CHA2DS2-VASc score is at least 4. - Continue heparin bridge to warfarin per pharmacy consult - continue home carvedilol  MDD Patient has h/o of MDD and currently meets criteria. She is not on an anti-depressant, but would like to start one.  Cymbalta is an appr pain. -Continue duloxetine 30 mg qd x 1 wk, and will titrate up from there. Will not start until after cath.   AKI,  resolved Continues to respond well to diuresis and has decreased to 0.96 on 9/5, down from 1.28 on admission. - daily BMP - avoid nephrotoxic agents - discontinue HCTZ  Pain in legs-resolved Patient reports that she is much improved today and that pain is much better.  - continue gabapentin 100 mg PO TID  DMT2 Overnight BG ranged from 114-145.  We will continue sensitive sliding scale insulin. A1c>15.5 on 02/06/19.  - continue SSI - will restart statin prior to discharge home now that patient no longer wishes to conceive  Microcytic Anemia Patient has a history of menorrhagia and H/H of 10.6/33.8 with MCV 66.7 today. Patient states that she has not had any abnormal bleeding since May of this year. Expect with diuresis, her H/H should improve somewhat, but MCV reveals more chronic process, likely related to menstrual cycle and decreased Fe intake. Will continue to monitor and discuss starting Fe supplement and/or iron studies. - monitor AM CBC  Medication adherence Patient does not have insurance and there is concern for her ability to access medication in outpatient setting. Will consult social work and appreciate recommendations for how best to assist this patient, as disposition, long term wellness, and prevention of readmission is dependent upon her adherence to medication regimen. - consult SW, appreciate recommendations -We will send medications to transitions of care pharmacy on discharge  HTN BP appropriate today at 115/89. Managed with amlodipine, HCTZ, and carvedilol. - continueamlodipine and carvedilol, hold HCTZ  OSA Pt does not use CPAP at home due to intolerance. May offer it to her if she prefers  while in the hospital.  Asthma Patient is not actively wheezing. - may continueVentolin 2 puff q6h PRNif needed  Anxiety Managed with hydroxyzine and trazodone. - continue home medications. - will discuss options for outpatient treatment as part of her  disposition care. - Consult social work for resources  FEN/GI: heart healthy diet, fluid restriction 1,500 mL PPx: Heparin GTT, bridging to warfarin  Disposition: To home when fluid status improves. Appreciate SW recommendations on access to medications and resources for mental health.  Subjective:  [name] reports that she continues to feel better.  She thinks that her abdominal swelling is improved and her legs are swelling less as well.  She does report some chronic low back pain.  Objective: Temp:  [97.4 F (36.3 C)-98 F (36.7 C)] 97.4 F (36.3 C) (09/05 0515) Pulse Rate:  [75-91] 78 (09/05 0515) Resp:  [13-44] 18 (09/05 0515) BP: (110-148)/(84-104) 115/89 (09/05 0515) SpO2:  [94 %-100 %] 100 % (09/05 0515) Weight:  [95.9 kg] 95.9 kg (09/05 0515) Physical Exam: General: Sitting on edge of bed eating breakfast, NAD Cardiovascular: RRR, no MRG Respiratory: CTA B, no crackles, comfortable work of breathing on room air Abdomen: soft, NT, distended but improved from admission Extremities: unable to assess 2/2 unna boots Neuro: Flat affect, improved mood  Laboratory: Recent Labs  Lab 03/27/19 1301 03/28/19 0523 03/29/19 0511 03/29/19 1651 03/29/19 1652  WBC 7.8 6.9 7.6  --   --   HGB 10.5* 10.6* 11.0* 13.6 13.3  HCT 34.7* 33.8* 36.0 40.0 39.0  PLT 276 268 277  --   --    Recent Labs  Lab 03/28/19 0523 03/28/19 1725 03/29/19 0511 03/29/19 1651 03/29/19 1652  NA 136 135 134* 137 139  K 3.3* 4.2 3.7 3.9 3.7  CL 99 98 97*  --   --   CO2 25 25 26   --   --   BUN 17 17 17   --   --   CREATININE 1.18* 1.16* 1.01*  --   --   CALCIUM 8.2* 8.7* 8.7*  --   --   GLUCOSE 113* 111* 130*  --   --    BNP 711  Imaging/Diagnostic Tests: No results found.  Tammy Alu, MD 03/30/2019, 7:14 AM PGY-3, [place] Intern pager: [phone], text pages welcome

## 2019-03-31 LAB — APTT: aPTT: 114 seconds — ABNORMAL HIGH (ref 24–36)

## 2019-03-31 LAB — VITAMIN B12: Vitamin B-12: 1751 pg/mL — ABNORMAL HIGH (ref 180–914)

## 2019-03-31 LAB — CBC
HCT: 42.9 % (ref 36.0–46.0)
Hemoglobin: 12.9 g/dL (ref 12.0–15.0)
MCH: 20.1 pg — ABNORMAL LOW (ref 26.0–34.0)
MCHC: 30.1 g/dL (ref 30.0–36.0)
MCV: 66.7 fL — ABNORMAL LOW (ref 80.0–100.0)
Platelets: 266 10*3/uL (ref 150–400)
RBC: 6.43 MIL/uL — ABNORMAL HIGH (ref 3.87–5.11)
RDW: 22.2 % — ABNORMAL HIGH (ref 11.5–15.5)
WBC: 6.5 10*3/uL (ref 4.0–10.5)
nRBC: 0 % (ref 0.0–0.2)

## 2019-03-31 LAB — GLUCOSE, CAPILLARY
Glucose-Capillary: 126 mg/dL — ABNORMAL HIGH (ref 70–99)
Glucose-Capillary: 125 mg/dL — ABNORMAL HIGH (ref 70–99)
Glucose-Capillary: 136 mg/dL — ABNORMAL HIGH (ref 70–99)
Glucose-Capillary: 164 mg/dL — ABNORMAL HIGH (ref 70–99)

## 2019-03-31 LAB — BASIC METABOLIC PANEL
Anion gap: 14 (ref 5–15)
BUN: 13 mg/dL (ref 6–20)
CO2: 35 mmol/L — ABNORMAL HIGH (ref 22–32)
Calcium: 9.3 mg/dL (ref 8.9–10.3)
Chloride: 84 mmol/L — ABNORMAL LOW (ref 98–111)
Creatinine, Ser: 1.05 mg/dL — ABNORMAL HIGH (ref 0.44–1.00)
GFR calc Af Amer: >60 mL/min (ref >60)
GFR calc non Af Amer: >60 mL/min (ref >60)
Glucose, Bld: 135 mg/dL — ABNORMAL HIGH (ref 70–99)
Potassium: 3.6 mmol/L (ref 3.5–5.1)
Sodium: 133 mmol/L — ABNORMAL LOW (ref 135–145)

## 2019-03-31 LAB — IRON AND TIBC
Iron: 20 ug/dL — ABNORMAL LOW (ref 28–170)
Saturation Ratios: 5 % — ABNORMAL LOW (ref 10.4–31.8)
TIBC: 406 ug/dL (ref 250–450)
UIBC: 386 ug/dL

## 2019-03-31 LAB — PROTIME-INR
INR: 1.6 — ABNORMAL HIGH (ref 0.8–1.2)
Prothrombin Time: 19.1 seconds — ABNORMAL HIGH (ref 11.4–15.2)

## 2019-03-31 LAB — RETICULOCYTES
Immature Retic Fract: 17.6 % — ABNORMAL HIGH (ref 2.3–15.9)
RBC.: 6.53 MIL/uL — ABNORMAL HIGH (ref 3.87–5.11)
Retic Count, Absolute: 84.2 10*3/uL (ref 19.0–186.0)
Retic Ct Pct: 1.3 % (ref 0.4–3.1)

## 2019-03-31 LAB — HEPARIN LEVEL (UNFRACTIONATED): Heparin Unfractionated: 1.05 IU/mL — ABNORMAL HIGH (ref 0.30–0.70)

## 2019-03-31 LAB — FERRITIN: Ferritin: 8 ng/mL — ABNORMAL LOW (ref 11–307)

## 2019-03-31 LAB — FOLATE: Folate: 12.6 ng/mL (ref >5.9)

## 2019-03-31 MED ORDER — WARFARIN SODIUM 5 MG PO TABS
5.0000 mg | ORAL_TABLET | Freq: Once | ORAL | Status: AC
  Administered 2019-03-31: 16:00:00 5 mg via ORAL
  Filled 2019-03-31: qty 1

## 2019-03-31 MED ORDER — FERROUS SULFATE 325 (65 FE) MG PO TABS
325.0000 mg | ORAL_TABLET | ORAL | Status: DC
  Administered 2019-03-31 – 2019-04-02 (×2): 325 mg via ORAL
  Filled 2019-03-31 (×3): qty 1

## 2019-03-31 NOTE — Progress Notes (Signed)
Progress Note  Patient Name: [name] Date of Encounter: 03/31/2019  Primary Cardiologist:   No primary care provider on file.   Subjective   She is very somnolent but answers questions.  Denies any acute pain or SOB.  Unfortunately, there is a [employer] salad with fried chicken that has been eating.  That is 1725 mg of Na.  ( I would guess that does not count the dressing.)  Inpatient Medications    Scheduled Meds: . atorvastatin  40 mg Oral q1800  . carvedilol  12.5 mg Oral BID  . digoxin  0.125 mg Oral Daily  . DULoxetine  30 mg Oral Daily  . gabapentin  100 mg Oral Q8H  . insulin aspart  0-9 Units Subcutaneous TID WC  . sacubitril-valsartan  1 tablet Oral BID  . sodium chloride flush  3 mL Intravenous Q12H  . sodium chloride flush  3 mL Intravenous Q12H  . sodium chloride flush  3 mL Intravenous Q12H  . spironolactone  25 mg Oral Daily  . Warfarin - Pharmacist Dosing Inpatient   Does not apply q1800   Continuous Infusions: . sodium chloride    . sodium chloride    . furosemide (LASIX) infusion 12 mg/hr (03/30/19 2033)  . heparin 1,150 Units/hr (03/30/19 2252)   PRN Meds: sodium chloride, sodium chloride, [COMPLETED] dicyclomine **FOLLOWED BY** acetaminophen, hydrOXYzine, ondansetron (ZOFRAN) IV, sodium chloride flush, sodium chloride flush, traZODone   Vital Signs    Vitals:   03/30/19 1942 03/30/19 2248 03/31/19 0528 03/31/19 0849  BP: 115/88 (!) 119/93 110/79 105/70  Pulse: 71 78 75 68  Resp: 18  16   Temp: 98 F (36.7 C)  (!) 97.4 F (36.3 C)   TempSrc:   Oral   SpO2: 100%  97%   Weight:   86.3 kg   Height:        Intake/Output Summary (Last 24 hours) at 03/31/2019 1108 Last data filed at 03/31/2019 0500 Gross per 24 hour  Intake 360 ml  Output 8350 ml  Net -7990 ml   Filed Weights   03/29/19 0516 03/30/19 0515 03/31/19 0528  Weight: 99.5 kg 95.9 kg 86.3 kg    Telemetry    NSR - Personally Reviewed  ECG    NA - Personally Reviewed   Physical Exam   GEN: No  acute distress.   Neck: No  JVD Cardiac: RRR, no murmurs, rubs, or gallops.  Respiratory: Clear   to auscultation bilaterally. GI: Soft, nontender, non-distended, normal bowel sounds  MS:  Moderate edema; No deformity. Neuro:   Nonfocal  Psych: Oriented and appropriate   Labs    Chemistry Recent Labs  Lab 03/29/19 0511  03/29/19 1652 03/30/19 0629 03/31/19 0808  NA 134*   < > 139 135 133*  K 3.7   < > 3.7 3.8 3.6  CL 97*  --   --  94* 84*  CO2 26  --   --  29 35*  GLUCOSE 130*  --   --  114* 135*  BUN 17  --   --  13 13  CREATININE 1.01*  --   --  0.96 1.05*  CALCIUM 8.7*  --   --  9.1 9.3  GFRNONAA >60  --   --  >60 >60  GFRAA >60  --   --  >60 >60  ANIONGAP 11  --   --  12 14   < > = values in this interval not displayed.  Hematology Recent Labs  Lab 03/29/19 0511  03/29/19 1652 03/30/19 0629 03/31/19 0808  WBC 7.6  --   --  6.3 6.5  RBC 5.39*  --   --  5.55* 6.43*  HGB 11.0*   < > 13.3 11.1* 12.9  HCT 36.0   < > 39.0 36.7 42.9  MCV 66.8*  --   --  66.1* 66.7*  MCH 20.4*  --   --  20.0* 20.1*  MCHC 30.6  --   --  30.2 30.1  RDW 21.9*  --   --  21.8* 22.2*  PLT 277  --   --  270 266   < > = values in this interval not displayed.    Cardiac EnzymesNo results for input(s): TROPONINI in the last 168 hours. No results for input(s): TROPIPOC in the last 168 hours.   BNP Recent Labs  Lab 03/27/19 1302  BNP 711.2*     DDimer No results for input(s): DDIMER in the last 168 hours.   Radiology    No results found.  Cardiac Studies   CATH  Left Main  No significant disease.  Left Anterior Descending  Luminal irregularities.  Left Circumflex  Luminal irregularities.  Right Coronary Artery  Extensive luminal irregularities.   Right Heart Pressures RHC Procedural Findings: Hemodynamics (mmHg) RA mean 18 RV 48/23 PA 49/30, mean 36 PCWP mean 23  Oxygen saturations: PA 57% AO 100%  Cardiac Output (Fick) 3.92   Cardiac Index (Fick) 2.07 PVR 3.3 WU PAPI 1.05     Patient Profile     43 y.o. female with history of type 2 diabetes (poorly controlled, last hgbA1c > 15), poorly controlled HTN, hyperlipidemia, paroxysmal atrial fibrillation, and chronic systolic CHF of uncertain etiology was admitted with CHF exacerbation. She has been found to have an LV thrombus  Assessment & Plan    ACUTE ON CHRONIC SYSTOLIC HF:   Right and left cath.  No CAD.  Evidence of RV dysfunction.   Excellent diuresis.  BP is tolerating this.      Na has fallen slightly.  I/O is net negative 8.8 liters yesterday and 14.7 since admission.  I am going to continue the current diuretic.      LV THROMBUS:  Switched to warfarin.  Continue heparin while titrating warfarin.  INR is creeping up  PAF:  NSR maintaining.  Starting warfarin.  Xarelto stopped.     For questions or updates, please contact [place] Please consult [email or web address] for contact info under Cardiology/STEMI.   Signed, Minus Breeding, MD  03/31/2019, 11:08 AM

## 2019-03-31 NOTE — Progress Notes (Signed)
Family Medicine Teaching Service Daily Progress Note Intern Pager: [phone]  Patient name: [name] Medical record number: [id number] Date of birth: 29-Oct-1975 Age: 43 y.o. Gender: female  Primary Care Provider: [name], DO Consultants: Cardiology Code Status: FULL  Pt Overview and Major Events to Date:  9/2- admitted 9/4- [name] cath   Assessment and Plan: [name] is a 43 yo female presenting with shortness of breath. PMH is significant for HFrEF with EF 20-25%, atrial fibrillation on Xarelto, HTN, T2DM, asthma, and MDD/anxiety.  Acute on chronic HFrEF:EF20-25% echo June 2020 Improving. Weight is down 21.3 lbs in last 24 hours, 34 lbs since admission.  UOP is 9.5L in last 24 hours, she is net down 14 L since admission.  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  Medications were changed by the heart failure team following these results. - digoxin 0.125 mg daily - furosemide gtt at 12 ml/hr - HOLDHCTZ, do not discharge on both HCTZ and furosemide - continue Entresto 24/26 mg BID - continue spironolactone 25 mg PO qd - strict I/Os - Fluid restriction 1,500 mL - monitor daily weights - continuecarvedilol 12.5 mg PO BID - consult cardiology, appreciate recommendations - daily BMP  Paroxysmal a fib Thrombus found in apex on echo while patient was reportedly on Xarelto, although adherence is questionable.  Currently being bridged with heparin with plans to transition her to warfarin.  INR is 1.4 on 9/5.  CHA2DS2-VASc score is at least 4. - Continue heparin bridge to warfarin per pharmacy consult - continue home carvedilol  MDD Patient has h/o of MDD and currently meets criteria. She is not on an anti-depressant, but would like to start one.  Cymbalta is an appr pain. -Continue duloxetine 30 mg qd x 1 wk, and will titrate up from there.    AKI, resolved Continues to respond well to  diuresis and has decreased to 1.05 on 9/6, down from 1.28 on admission. - daily BMP - avoid nephrotoxic agents - discontinue HCTZ  Pain in legs-resolved Patient reports that she is much improved today and that pain is much better.  - continue gabapentin 100 mg PO TID  DMT2 Overnight BG ranged from 114-145.  We will continue sensitive sliding scale insulin. A1c>15.5 on 02/06/19.  - continue SSI - will restart statin prior to discharge home now that patient no longer wishes to conceive  Microcytic Anemia- resolving Patient has a history of menorrhagia and H/H of 12.9/42.9 with MCV 66.7 today. Patient states that she has not had any abnormal bleeding since May of this year. Improving with diuresis. Will continue to monitor and discuss starting Fe supplement and/or iron studies. - monitor AM CBC  Medication adherence Patient does not have insurance and there is concern for her ability to access medication in outpatient setting. Will consult social work and appreciate recommendations for how best to assist this patient, as disposition, long term wellness, and prevention of readmission is dependent upon her adherence to medication regimen. - consult SW, appreciate recommendations -We will send medications to transitions of care pharmacy on discharge  HTN BP appropriate today at 115/89. Managed with amlodipine, HCTZ, and carvedilol. - continueamlodipine and carvedilol, hold HCTZ  OSA Pt does not use CPAP at home due to intolerance. May offer it to her if she prefers while in the hospital.  Asthma Patient is not actively wheezing. - may continueVentolin 2 puff q6h PRNif needed  Anxiety Managed with hydroxyzine and  trazodone. - continue home medications. - will discuss options for outpatient treatment as part of her disposition care. - Consult social work for resources  FEN/GI: heart healthy diet, fluid restriction 1,500 mL PPx: Heparin GTT, bridging to  warfarin  Disposition: To home when fluid status improves. Appreciate SW recommendations on access to medications and resources for mental health.  Subjective:  [name] reports that she continues to feel better.  She thinks that her abdominal swelling is improved and her legs are swelling less as well.  She does report some chronic low back pain.  Objective: Temp:  [97.4 F (36.3 C)-98 F (36.7 C)] 97.4 F (36.3 C) (09/06 0528) Pulse Rate:  [68-78] 68 (09/06 0849) Resp:  [16-19] 16 (09/06 0528) BP: (105-119)/(70-93) 105/70 (09/06 0849) SpO2:  [97 %-100 %] 97 % (09/06 0528) Weight:  [86.3 kg] 86.3 kg (09/06 0528) Physical Exam: General: Well-appearing, resting in bed comfortably, NAD Cardiovascular: RRR, no MRG Respiratory: CTA B, no crackles, comfortable work of breathing on room air Abdomen: soft, NT, nondistended Extremities: unable to assess 2/2 unna boots Neuro: Flat affect, improved mood  Laboratory: Recent Labs  Lab 03/29/19 0511  03/29/19 1652 03/30/19 0629 03/31/19 0808  WBC 7.6  --   --  6.3 6.5  HGB 11.0*   < > 13.3 11.1* 12.9  HCT 36.0   < > 39.0 36.7 42.9  PLT 277  --   --  270 266   < > = values in this interval not displayed.   Recent Labs  Lab 03/29/19 0511  03/29/19 1652 03/30/19 0629 03/31/19 0808  NA 134*   < > 139 135 133*  K 3.7   < > 3.7 3.8 3.6  CL 97*  --   --  94* 84*  CO2 26  --   --  29 35*  BUN 17  --   --  13 13  CREATININE 1.01*  --   --  0.96 1.05*  CALCIUM 8.7*  --   --  9.1 9.3  GLUCOSE 130*  --   --  114* 135*   < > = values in this interval not displayed.   BNP 711  Imaging/Diagnostic Tests: No results found.  Tammy Damme, MD 03/31/2019, 10:05 AM PGY-3, [place] Intern pager: [phone], text pages welcome

## 2019-03-31 NOTE — Progress Notes (Signed)
[place] for heparin and warfarin Indication: LV Thrombus  Allergies  Allergen Reactions  . Shellfish Allergy Swelling    Airway involvement including EMS - Has Epi-Pen  . Metformin And Related Diarrhea    Patient Measurements: Height: 4\' 9"  (144.8 cm) Weight: 190 lb 3.2 oz (86.3 kg)(scale b) IBW/kg (Calculated) : 38.6 Heparin Dosing Weight: 65kg  Vital Signs: Temp: 97.4 F (36.3 C) (09/06 0528) Temp Source: Oral (09/06 0528) BP: 105/70 (09/06 0849) Pulse Rate: 68 (09/06 0849)  Labs: Recent Labs    03/29/19 0511  03/29/19 0853 03/29/19 1651 03/29/19 1652 03/30/19 0629 03/30/19 1513 03/31/19 0808  HGB 11.0*  --   --  13.6 13.3 11.1*  --   --   HCT 36.0  --   --  40.0 39.0 36.7  --   --   PLT 277  --   --   --   --  270  --   --   APTT  --   --  70*  --   --  52* 89*  --   LABPROT  --   --   --   --   --  16.9*  --   --   INR  --   --   --   --   --  1.4*  --   --   HEPARINUNFRC  --    < > 1.92*  --   --  0.97* 0.99* 1.05*  CREATININE 1.01*  --   --   --   --  0.96  --  1.05*   < > = values in this interval not displayed.    Estimated Creatinine Clearance: 64.2 mL/min (A) (by C-G formula based on SCr of 1.05 mg/dL (H)).   Medical History: Past Medical History:  Diagnosis Date  . Abnormal Pap smear   . Anemia   . Asthma   . Atrial fibrillation ([place])   . Diabetes mellitus   . Heart failure with reduced ejection fraction ([place])   . Hypertension   . 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    . Migraines   . Morbid obesity ([place]) 02/06/2019  . Obstructive sleep apnea 06/30/2015  . Polycystic ovarian disease     Assessment: 23 yoF admitted with SOB and new LV thrombus. Patient was on Xarelto PTA (last dose 9/2 at 1830) for history of atrial fibrillation but transitioned to heparin drip on admission until cardiac  cath. Now post-cath, pharmacy consulted to start IV Heparin back 8 hours after sheath pull and begin Warfarin therapy due to new LV thrombus.   Todays HL is elevated at 1.05 on 1150 units/hr. APTT is slightly elevated at 114, but has increased since the level of 89 yesterday.  Heparin level is still not correlating with aPTT. CBC is stable with H&H of 12.9/42.9 and plts are wnl at 266. INR today is 1.6.    Goal of Therapy:  Heparin level 0.3-0.7 units/ml aPTT 66-102 seconds INR 2-3 Monitor platelets by anticoagulation protocol: Yes    Plan:  Decrease heparin infusion to 1050 units/hour   Warfarin 5 mg po x 1 tonight.  Daily INR as well as aPTT and HL until correlating.  Follow CBC and S/Sx bleeding    Thank you,   Eddie Candle, PharmD PGY-1 Pharmacy Resident   Please refer to [facility]  for [place] numbers

## 2019-04-01 LAB — BASIC METABOLIC PANEL
Anion gap: 12 (ref 5–15)
BUN: 15 mg/dL (ref 6–20)
CO2: 36 mmol/L — ABNORMAL HIGH (ref 22–32)
Calcium: 9.2 mg/dL (ref 8.9–10.3)
Chloride: 84 mmol/L — ABNORMAL LOW (ref 98–111)
Creatinine, Ser: 1.24 mg/dL — ABNORMAL HIGH (ref 0.44–1.00)
GFR calc Af Amer: >60 mL/min (ref >60)
GFR calc non Af Amer: 54 mL/min — ABNORMAL LOW (ref >60)
Glucose, Bld: 115 mg/dL — ABNORMAL HIGH (ref 70–99)
Potassium: 3.3 mmol/L — ABNORMAL LOW (ref 3.5–5.1)
Sodium: 132 mmol/L — ABNORMAL LOW (ref 135–145)

## 2019-04-01 LAB — CBC
HCT: 41.6 % (ref 36.0–46.0)
Hemoglobin: 12.7 g/dL (ref 12.0–15.0)
MCH: 20.2 pg — ABNORMAL LOW (ref 26.0–34.0)
MCHC: 30.5 g/dL (ref 30.0–36.0)
MCV: 66 fL — ABNORMAL LOW (ref 80.0–100.0)
Platelets: 282 10*3/uL (ref 150–400)
RBC: 6.3 MIL/uL — ABNORMAL HIGH (ref 3.87–5.11)
RDW: 22.1 % — ABNORMAL HIGH (ref 11.5–15.5)
WBC: 7 10*3/uL (ref 4.0–10.5)
nRBC: 0 % (ref 0.0–0.2)

## 2019-04-01 LAB — GLUCOSE, CAPILLARY
Glucose-Capillary: 154 mg/dL — ABNORMAL HIGH (ref 70–99)
Glucose-Capillary: 179 mg/dL — ABNORMAL HIGH (ref 70–99)
Glucose-Capillary: 176 mg/dL — ABNORMAL HIGH (ref 70–99)
Glucose-Capillary: 145 mg/dL — ABNORMAL HIGH (ref 70–99)

## 2019-04-01 LAB — PROTIME-INR
INR: 2.1 — ABNORMAL HIGH (ref 0.8–1.2)
Prothrombin Time: 23.1 seconds — ABNORMAL HIGH (ref 11.4–15.2)

## 2019-04-01 LAB — HEPARIN LEVEL (UNFRACTIONATED): Heparin Unfractionated: 0.86 IU/mL — ABNORMAL HIGH (ref 0.30–0.70)

## 2019-04-01 LAB — APTT: aPTT: 116 seconds — ABNORMAL HIGH (ref 24–36)

## 2019-04-01 MED ORDER — POTASSIUM CHLORIDE CRYS ER 20 MEQ PO TBCR
40.0000 meq | EXTENDED_RELEASE_TABLET | Freq: Once | ORAL | Status: AC
  Administered 2019-04-01: 12:00:00 40 meq via ORAL
  Filled 2019-04-01: qty 2

## 2019-04-01 MED ORDER — WARFARIN SODIUM 5 MG PO TABS
5.0000 mg | ORAL_TABLET | Freq: Once | ORAL | Status: AC
  Administered 2019-04-01: 5 mg via ORAL
  Filled 2019-04-01: qty 1

## 2019-04-01 MED ORDER — POTASSIUM CHLORIDE CRYS ER 20 MEQ PO TBCR
20.0000 meq | EXTENDED_RELEASE_TABLET | Freq: Every day | ORAL | Status: DC
  Administered 2019-04-02: 20 meq via ORAL
  Filled 2019-04-01: qty 1

## 2019-04-01 MED ORDER — TORSEMIDE 20 MG PO TABS
40.0000 mg | ORAL_TABLET | Freq: Every day | ORAL | Status: DC
  Administered 2019-04-02: 40 mg via ORAL
  Filled 2019-04-01 (×2): qty 2

## 2019-04-01 MED ORDER — CARVEDILOL 6.25 MG PO TABS
6.2500 mg | ORAL_TABLET | Freq: Two times a day (BID) | ORAL | Status: DC
  Administered 2019-04-01 – 2019-04-02 (×2): 6.25 mg via ORAL
  Filled 2019-04-01 (×2): qty 1

## 2019-04-01 NOTE — Progress Notes (Signed)
[place] for heparin Indication: LV Thrombus  Allergies  Allergen Reactions  . Shellfish Allergy Swelling    Airway involvement including EMS - Has Epi-Pen  . Metformin And Related Diarrhea    Patient Measurements: Height: 4\' 9"  (144.8 cm) Weight: 190 lb 3.2 oz (86.3 kg)(scale b) IBW/kg (Calculated) : 38.6 Heparin Dosing Weight: 65kg  Vital Signs: Temp: 97.7 F (36.5 C) (09/06 2107) Temp Source: Oral (09/06 2107) BP: 107/83 (09/06 2107) Pulse Rate: 71 (09/06 2107)  Labs: Recent Labs    03/29/19 0511  03/30/19 0629 03/30/19 1513 03/31/19 0808 04/01/19 0328  HGB 11.0*   < > 11.1*  --  12.9 12.7  HCT 36.0   < > 36.7  --  42.9 41.6  PLT 277  --  270  --  266 282  APTT  --    < > 52* 89* 114* 116*  LABPROT  --   --  16.9*  --  19.1*  --   INR  --   --  1.4*  --  1.6*  --   HEPARINUNFRC  --    < > 0.97* 0.99* 1.05* 0.86*  CREATININE 1.01*  --  0.96  --  1.05*  --    < > = values in this interval not displayed.    Estimated Creatinine Clearance: 64.2 mL/min (A) (by C-G formula based on SCr of 1.05 mg/dL (H)).  Assessment: 54 yoF admitted with SOB and new LV thrombus. Patient was on Xarelto PTA (last dose 9/2 at 1830) for history of atrial fibrillation but transitioned to heparin drip on admission until cardiac cath. Now post-cath, pharmacy consulted to start IV Heparin back 8 hours after sheath pull and begin Warfarin therapy due to new LV thrombus.   Heparin level this morning 0.86, aPTT 116 sec  Goal of Therapy:  Heparin level 0.3-0.7 units/ml aPTT 66-102 seconds Monitor platelets by anticoagulation protocol: Yes    Plan:  Decrease heparin infusion to 900 units/hour    Check heparin level and aPTT 6-8 hours after rate decrease Follow CBC and S/Sx bleeding  Thanks for allowing pharmacy to be a part of this patient's care.  Excell Seltzer, PharmD Clinical Pharmacist

## 2019-04-01 NOTE — Progress Notes (Signed)
[place] for warfarin Indication: LV Thrombus  Allergies  Allergen Reactions  . Shellfish Allergy Swelling    Airway involvement including EMS - Has Epi-Pen  . Metformin And Related Diarrhea    Patient Measurements: Height: 4\' 9"  (144.8 cm) Weight: 188 lb 7.9 oz (85.5 kg) IBW/kg (Calculated) : 38.6 Heparin Dosing Weight: 65kg  Vital Signs: Temp: 98 F (36.7 C) (09/07 1237) Temp Source: Oral (09/07 1237) BP: 92/57 (09/07 1237) Pulse Rate: 77 (09/07 1237)  Labs: Recent Labs    03/30/19 0629 03/30/19 1513 03/31/19 0808 04/01/19 0328  HGB 11.1*  --  12.9 12.7  HCT 36.7  --  42.9 41.6  PLT 270  --  266 282  APTT 52* 89* 114* 116*  LABPROT 16.9*  --  19.1* 23.1*  INR 1.4*  --  1.6* 2.1*  HEPARINUNFRC 0.97* 0.99* 1.05* 0.86*  CREATININE 0.96  --  1.05* 1.24*    Estimated Creatinine Clearance: 54.1 mL/min (A) (by C-G formula based on SCr of 1.24 mg/dL (H)).   Medical History: Past Medical History:  Diagnosis Date  . Abnormal Pap smear   . Anemia   . Asthma   . Atrial fibrillation ([place])   . Diabetes mellitus   . Heart failure with reduced ejection fraction ([place])   . Hypertension   . 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    . Migraines   . Morbid obesity ([place]) 02/06/2019  . Obstructive sleep apnea 06/30/2015  . Polycystic ovarian disease     Assessment: 33 yoF admitted with SOB and new LV thrombus. Patient was on Xarelto PTA (last dose 9/2 at 1830) for history of atrial fibrillation but transitioned to heparin drip on admission until cardiac cath. Now post-cath, pharmacy consulted to start IV Heparin back 8 hours after sheath pull and begin Warfarin therapy due to new LV thrombus.   Todays HL is elevated at 0.86 and aPTT is 116. Heparin has been discontinued due to a therapeutic INR of 2.1. H&H is stable at  12.7/41.6 and plts are wnl at 282.     Goal of Therapy:  INR 2-3 Monitor platelets by anticoagulation protocol: Yes    Plan:  Warfarin 5 mg po x 1 tonight.  Daily INR and CBC Follow S/Sx bleeding    Thank you,   Eddie Candle, PharmD PGY-1 Pharmacy Resident   Please refer to [facility] for [place] numbers

## 2019-04-01 NOTE — Progress Notes (Signed)
   Message sent to the coumadin clinic at [facility], as well as the Adv HF clinic to arrange follow-up after discharge. The patient should expect a call from these offices shortly after discharge with the appointment dates/times.   Abigail Butts, PA-C 04/01/19; 11:40 AM

## 2019-04-01 NOTE — Progress Notes (Addendum)
Patient ID: [name], female   DOB: 04-05-1976, 43 y.o.   MRN: [id number]     Advanced Heart Failure Rounding Note  PCP-Cardiologist: No primary care provider on file.   Subjective:    Good diuresis yesterday, weight down.  Not very interactive but says that breathing is getting better. Creatinine mildly higher at 1.24.    RHC/LHC Coronary Findings Diagnostic Dominance: Right Left Main  No significant disease.  Left Anterior Descending  Luminal irregularities.  Left Circumflex  Luminal irregularities.  Right Coronary Artery  Extensive luminal irregularities.  Intervention  No interventions have been documented. Right Heart  Right Heart Pressures RHC Procedural Findings: Hemodynamics (mmHg) RA mean 18 RV 48/23 PA 49/30, mean 36 PCWP mean 23  Oxygen saturations: PA 57% AO 100%  Cardiac Output (Fick) 3.92  Cardiac Index (Fick) 2.07 PVR 3.3 WU PAPI 1.05      Objective:   Weight Range: 85.5 kg Body mass index is 40.79 kg/m.   Vital Signs:   Temp:  [97.6 F (36.4 C)-97.7 F (36.5 C)] 97.6 F (36.4 C) (09/07 0600) Pulse Rate:  [61-76] 74 (09/07 0515) Resp:  [16-20] 16 (09/07 0514) BP: (80-124)/(57-83) 96/66 (09/07 0600) SpO2:  [94 %-99 %] 94 % (09/07 0514) Weight:  [85.5 kg] 85.5 kg (09/07 0600) Last BM Date: 03/30/19  Weight change: Filed Weights   03/30/19 0515 03/31/19 0528 04/01/19 0600  Weight: 95.9 kg 86.3 kg 85.5 kg    Intake/Output:   Intake/Output Summary (Last 24 hours) at 04/01/2019 0832 Last data filed at 04/01/2019 0558 Gross per 24 hour  Intake 1783.59 ml  Output 5330 ml  Net -3546.41 ml      Physical Exam    General: NAD Neck: JVP 8 cm, no thyromegaly or thyroid nodule.  Lungs: Clear to auscultation bilaterally with normal respiratory effort. CV: Nondisplaced PMI.  Heart regular S1/S2, no S3/S4, no murmur.  No peripheral edema, legs wrapped. Abdomen: Soft, nontender, no hepatosplenomegaly, no distention.  Skin: Intact  without lesions or rashes.  Neurologic: Alert and oriented x 3.  Psych: Normal affect. Extremities: No clubbing or cyanosis.  HEENT: Normal.    Telemetry   NSR 80s (personally reviewed)  Labs    CBC Recent Labs    03/31/19 0808 04/01/19 0328  WBC 6.5 7.0  HGB 12.9 12.7  HCT 42.9 41.6  MCV 66.7* 66.0*  PLT 266 [id number]   Basic Metabolic Panel Recent Labs    03/31/19 0808 04/01/19 0328  NA 133* 132*  K 3.6 3.3*  CL 84* 84*  CO2 35* 36*  GLUCOSE 135* 115*  BUN 13 15  CREATININE 1.05* 1.24*  CALCIUM 9.3 9.2   Liver Function Tests No results for input(s): AST, ALT, ALKPHOS, BILITOT, PROT, ALBUMIN in the last 72 hours. No results for input(s): LIPASE, AMYLASE in the last 72 hours. Cardiac Enzymes No results for input(s): CKTOTAL, CKMB, CKMBINDEX, TROPONINI in the last 72 hours.  BNP: BNP (last 3 results) Recent Labs    12/25/18 1026 02/19/19 2026 03/27/19 1302  BNP 401.2* 654.7* 711.2*    ProBNP (last 3 results) No results for input(s): PROBNP in the last 8760 hours.   D-Dimer No results for input(s): DDIMER in the last 72 hours. Hemoglobin A1C No results for input(s): HGBA1C in the last 72 hours. Fasting Lipid Panel No results for input(s): CHOL, HDL, LDLCALC, TRIG, CHOLHDL, LDLDIRECT in the last 72 hours. Thyroid Function Tests No results for input(s): TSH, T4TOTAL, T3FREE, THYROIDAB in the last 72  hours.  Invalid input(s): FREET3  Other results:   Imaging    No results found.   Medications:     Scheduled Medications: . atorvastatin  40 mg Oral q1800  . carvedilol  12.5 mg Oral BID  . digoxin  0.125 mg Oral Daily  . DULoxetine  30 mg Oral Daily  . ferrous sulfate  325 mg Oral QODAY  . gabapentin  100 mg Oral Q8H  . insulin aspart  0-9 Units Subcutaneous TID WC  . [START ON 04/02/2019] potassium chloride  20 mEq Oral Daily  . potassium chloride  40 mEq Oral Once  . sacubitril-valsartan  1 tablet Oral BID  . sodium chloride flush  3 mL  Intravenous Q12H  . sodium chloride flush  3 mL Intravenous Q12H  . sodium chloride flush  3 mL Intravenous Q12H  . spironolactone  25 mg Oral Daily  . torsemide  40 mg Oral Daily  . Warfarin - Pharmacist Dosing Inpatient   Does not apply q1800    Infusions: . sodium chloride    . sodium chloride      PRN Medications: sodium chloride, sodium chloride, [COMPLETED] dicyclomine **FOLLOWED BY** acetaminophen, hydrOXYzine, ondansetron (ZOFRAN) IV, sodium chloride flush, sodium chloride flush, traZODone   Assessment/Plan   1. Acute on chronic systolic CHF:  Nonischemic cardiomyopathy, diagnosed in 6/20.  Echo this admission shows EF 20-25%, diffuse hypokinesis, LV thrombus, moderate RV systolic dysfunction, mild MR, moderate TR.  She has a history of paroxysmal atrial fibrillation but seems to be mostly in sinus rhythm.  This is not likely to be a tachycardia-mediated cardiomyopathy.  Poorly controlled diabetes itself can cause a cardiomyopathy, as can poorly controlled HTN.  Myocarditis is a potential cause of her cardiomyopathy. 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  RHC showed R>L heart failure with low PAPI suggesting significant RV dysfunction.  She continue to diurese well, volume status significantly improved.  - Stop IV Lasix today, start torsemide 40 mg daily.   - Continue Entresto 24/26 bid. No BP room to increase.  - Continue spironolactone 25 mg daily.  - Continue Coreg 12.5 mg bid.  - Continue unna boots.  - She has been trying to get pregnant (has been difficult with polycystic ovarian syndrome).  She has 2 grown children already.  We have discussed this.  With her cardiomyopathy and active CHF, pregnancy would create a significant mortality risk for her.  A number of cardiac meds that she would ideally be on to control her CHF are feto-toxic.  I recommended that she NOT try to get pregnant at this point and use a form of birth control.  She has agreed with this.  2. LV  thrombus: LV thrombus noted on echo this admission.  She says that she has been compliant with Xarelto.   - Xarelto has been stopped and she has been transitioned to warfarin.  INR is now therapeutic, can stop heparin gtt.  3. Atrial fibrillation: Paroxysmal. She has been in NSR this admission.  - INR now therapeutic, stop heparin.   - Would consider atrial fibrillation ablation in the future given [place]-HF data.  4. Type 2 diabetes: Poor control, per primary service.  5. Hypertension: BP now on the lower side.     Home soon.   Length of Stay: [place], [place]  04/01/2019, 8:32 AM  Advanced Heart Failure Team Pager [phone] (M-F; 7a - 4p)  Please contact [facility] for night-coverage after hours (4p -7a ) and weekends  on [email or web address]

## 2019-04-01 NOTE — Plan of Care (Signed)
  Problem: Education: Goal: Knowledge of General Education information will improve Description Including pain rating scale, medication(s)/side effects and non-pharmacologic comfort measures Outcome: Progressing   Problem: Health Behavior/Discharge Planning: Goal: Ability to manage health-related needs will improve Outcome: Progressing   

## 2019-04-01 NOTE — Progress Notes (Signed)
Family Medicine Teaching Service Daily Progress Note Intern Pager: [phone]  Patient name: [name] Medical record number: [id number] Date of birth: Feb 24, 1976 Age: 43 y.o. Gender: female  Primary Care Provider: [name], DO Consultants: Cardiology Code Status: FULL  Pt Overview and Major Events to Date:  9/2- admitted 9/4- [place] cath  9/7- INR therapeutic  Assessment and Plan: [name] is a 43 yo female presenting with shortness of breath. PMH is significant for HFrEF with EF 20-25%, atrial fibrillation on Xarelto, HTN, T2DM, asthma, and MDD/anxiety.  Acute on chronic HFrEF:EF20-25% echo June 2020 Improving. Weight is down 1.5 lbs in last 24 hours, 35.5 lbs since admission.  Likely have reached her dry weight.  UOP is 5.3L in last 24 hours, she is net down 17.8 L since admission.  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  Medications were changed by the heart failure team following these results. Potassium down to 3.3 today, 1x K-DUR 40 mEq. - digoxin 0.125 mg daily - furosemide gtt d/c - START torsemide 40 mg PO qd - HOLDHCTZ, do not discharge on both HCTZ and furosemide - continue Entresto 24/26 mg BID - continue spironolactone 25 mg PO qd - strict I/Os - Fluid restriction 1,500 mL - monitor daily weights - continuecarvedilol 12.5 mg PO BID - K-Dur 40 mEq x1 - consult cardiology, appreciate recommendations - daily BMP  Paroxysmal a fib Thrombus found in apex on echo while patient was reportedly on Xarelto, although adherence is questionable.  Heparin gtt d/c by Cardiology.  INR is 2.1 on today.  CHA2DS2-VASc score is at least 4. - Continue warfarin per pharmacy consult - continue home carvedilol  MDD Patient has h/o of MDD and currently meets criteria. She is not on an anti-depressant, but would like to start one.  Cymbalta is an appr pain. -Continue duloxetine 30 mg qd x 1 wk,  and will titrate up from there.    AKI Continues to respond well to diuresis and has decreased to 1.05 on 9/6, cr bumped to 1.24 today likely indicating that this is as far as diuresis will go. Creatinine was 1.28 on admission. - daily BMP - avoid nephrotoxic agents - discontinue HCTZ  Pain in legs-resolved Patient reports that she is much improved today and that pain is much better.  - continue gabapentin 100 mg PO TID  DMT2 Overnight BG ranged from 125-164.  We will continue sensitive sliding scale insulin. A1c>15.5 on 02/06/19.  - continue SSI - will restart statin prior to discharge home now that patient no longer wishes to conceive  Microcytic Anemia- resolving Patient has a history of menorrhagia and H/H of 12.7/41.6 with MCV 66.7 today. Patient states that she has not had any abnormal bleeding since May of this year. Improving with diuresis.  Iron studies revealed low iron to 20, high TIBC to 406, low ferritin to 8, confirming iron deficiency anemia.  Ferrous sulfate p.o. started today. - monitor AM CBC  - ferrous sulfate PO  Medication adherence Patient does not have insurance and there is concern for her ability to access medication in outpatient setting. Will consult social work and appreciate recommendations for how best to assist this patient, as disposition, long term wellness, and prevention of readmission is dependent upon her adherence to medication regimen. - consult SW, appreciate recommendations -We will send medications to transitions of care pharmacy on discharge  HTN SBP 90-120, DBP 70-90s. Managed with amlodipine, HCTZ, and  carvedilol. - continueamlodipine and carvedilol, hold HCTZ  OSA Pt does not use CPAP at home due to intolerance. May offer it to her if she prefers while in the hospital.  Asthma Patient is not actively wheezing. - may continueVentolin 2 puff q6h PRNif needed  Anxiety Managed with hydroxyzine and trazodone. - continue  home medications. - will discuss options for outpatient treatment as part of her disposition care. - Consult social work for resources  FEN/GI: heart healthy diet, fluid restriction 1,500 mL PPx: Heparin GTT, bridging to warfarin  Disposition: To home when fluid status improves. Appreciate SW recommendations on access to medications and resources for mental health.  Subjective:  [name] reports that she continues to feel better.  She thinks that her abdominal swelling is improved and her legs are swelling less as well.  She is very somnolent today would not answer as many questions as normal, states that she is "just tired."  Objective: Temp:  [97.6 F (36.4 C)-97.7 F (36.5 C)] 97.6 F (36.4 C) (09/07 0600) Pulse Rate:  [61-78] 78 (09/07 0859) Resp:  [16-20] 16 (09/07 0514) BP: (80-124)/(57-83) 98/64 (09/07 0911) SpO2:  [94 %-99 %] 94 % (09/07 0514) Weight:  [85.5 kg] 85.5 kg (09/07 0600) Physical Exam: General: Well-appearing, resting in bed comfortably, NAD Cardiovascular: RRR, no MRG Respiratory: CTAB, no crackles, comfortable work of breathing on room air Abdomen: soft, NT, nondistended Extremities: unable to assess 2/2 unna boots Neuro: Flat affect, improved mood  Laboratory: Recent Labs  Lab 03/30/19 0629 03/31/19 0808 04/01/19 0328  WBC 6.3 6.5 7.0  HGB 11.1* 12.9 12.7  HCT 36.7 42.9 41.6  PLT 270 266 282   Recent Labs  Lab 03/30/19 0629 03/31/19 0808 04/01/19 0328  NA 135 133* 132*  K 3.8 3.6 3.3*  CL 94* 84* 84*  CO2 29 35* 36*  BUN 13 13 15   CREATININE 0.96 1.05* 1.24*  CALCIUM 9.1 9.3 9.2  GLUCOSE 114* 135* 115*   BNP 711  Imaging/Diagnostic Tests: No results found.  Tammy Damme, MD 04/01/2019, 9:47 AM PGY-3, [place] Intern pager: [phone], text pages welcome

## 2019-04-01 NOTE — Progress Notes (Signed)
FPTS Interim Progress Note  Patient's blood pressure have remained on the soft side throughout the day today, even with holding of Entresto this AM (90's/60's with lowest BP 80/57). Discussed with cardiology and will plan to hold all blood pressure lowering meds for tonight. Patient may be volume down given the extent of her diuresis the days prior. Nurse notified of changes.  - hold PM dose of torsemide, Coreg, and Entresto - continue to monitor vitals closely  Danna Hefty, DO 04/01/2019, 4:39 PM PGY-2, [place] Service pager [phone]

## 2019-04-01 NOTE — Progress Notes (Signed)
FPTS Interim Progress Note  S: Went to speak to patient about her disposition plans including how to get her medications, as well as her diet.  She was sound asleep when I got to the room, arousable but only answering, "yeah yeah" before falling back to sleep.  She was similarly somnolent this morning before rounds, and behaved the same way with Dr. Andria Frames, and Dr. Claris Gladden note (cardiology) also sounds the same.  Spoke with her nurse, who said that the signout from the night team did not reveal any problems including patient being awake.  When asked, patient stated that she slept "off and on" throughout the night.  She has not been the somnolent at any point during her admission, it could be due to being tired from not sleeping well, but will keep an eye on it.  Other than hypotension, her vitals have been stable, and she is on room air.  Nursing did see her sitting up at bedside when she took her medication earlier.  Furosemide drip was discontinued overnight, last dose at 2 AM.  She has not received torsemide today.  We discontinued amlodipine and decreased Coreg to 6.25 mg today.  O: BP (!) 92/57 (BP Location: Right Arm)   Pulse 77   Temp 98 F (36.7 C) (Oral)   Resp 18   Ht 4\' 9"  (1.448 m)   Wt 85.5 kg   SpO2 93%   BMI 40.79 kg/m     A/P: Ms. Laabs is more somnolent than usual today.  It appears to be because she did not sleep well last night, however we will keep a very close eye on it 2/2 hypotension.  Will defer torsemide dosing to cardiology.  Gladys Damme, MD 04/01/2019, 3:10 PM PGY-1, [place] Medicine Service pager [phone]

## 2019-04-01 NOTE — Progress Notes (Signed)
   Vital Signs MEWS/VS Documentation      03/31/2019 2000 03/31/2019 2103 03/31/2019 2107 04/01/2019 0514   MEWS Score:  0  0  0  2   MEWS Score Color:  Green  Green  Green  Yellow   Resp:  -  -  20  16   Pulse:  -  71  71  76   BP:  -  107/83  107/83  (!) 80/57   Temp:  -  -  97.7 F (36.5 C)  -   O2 Device:  -  -  Room Air  Room Air   Level of Consciousness:  Alert  -  -  -           Tristan Schroeder 04/01/2019,5:18 AM

## 2019-04-01 NOTE — Progress Notes (Signed)
RN paged cardiology regarding patient BP of 112/83 with scheduled Coreg and Entresto.  Ahkter, MD stated to give Coreg first and recheck BP in about an hour.  If BP remains about the same, advised to give Entresto.

## 2019-04-01 NOTE — Progress Notes (Signed)
Family Medicine Teaching Service Daily Progress Note Intern Pager: [phone]  Patient name: [name] Medical record number: [id number] Date of birth: [birth year] Age: 43 y.o. Gender: female  Primary Care Provider: [name], DO Consultants: Cardiology Code Status: FULL  Pt Overview and Major Events to Date:  9/2- admitted 9/4- [place] cath  9/7- INR therapeutic  Assessment and Plan: Madisin Sherfey is a 43 yo female presenting with shortness of breath. PMH is significant for HFrEF with EF 20-25%, atrial fibrillation on Xarelto, HTN, T2DM, asthma, and MDD/anxiety.  Acute on chronic HFrEF:EF20-25% echo June 2020 Improving. Weight is down 1.5 lbs in last 24 hours, 35.5 lbs since admission.  Likely have reached her dry weight.  UOP is 5.3L in last 24 hours, she is net down 17.8 L since admission.  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  Medications were changed by the heart failure team following these results. Potassium down to 3.3 today, 1x K-DUR 40 mEq per cardiology recs. - digoxin 0.125 mg daily - furosemide gtt d/c - START torsemide 40 mg PO qd - HOLDHCTZ, do not discharge on both HCTZ and furosemide - continue Entresto 24/26 mg BID - continue spironolactone 25 mg PO qd - strict I/Os - Fluid restriction 1,500 mL - monitor daily weights - Decreasecarvedilol to 6.25 mg PO BID - K-Dur 40 mEq x1 - consult cardiology, appreciate recommendations - daily BMP  Paroxysmal a fib  Thrombus found in apex on echo while patient was reportedly on Xarelto, although adherence is questionable.  Heparin gtt d/c by Cardiology.  INR is 2.1 on today.  CHA2DS2-VASc score is at least 4. - Continue warfarin per pharmacy consult - continue home carvedilol   MDD Patient has h/o of MDD and currently meets criteria. She is not on an anti-depressant, but would like to start one.  Cymbalta is an appr choice for  neuropathy as well. -Continue duloxetine 30 mg qd x 1 wk, and will titrate up from there.    AKI Continues to respond well to diuresis and has decreased to 1.05 on 9/6, cr bumped to 1.24 today likely indicating that this is as far as diuresis will go. Creatinine was 1.28 on admission. - daily BMP - avoid nephrotoxic agents - discontinue HCTZ  Pain in legs-resolved Patient reports that she is much improved today and that pain is much better.  - continue gabapentin 100 mg PO TID  DMT2 Overnight BG ranged from 125-164.  We will continue sensitive sliding scale insulin. A1c>15.5 on 02/06/19.  - continue SSI - will restart statin prior to discharge home now that patient no longer wishes to conceive  Microcytic Anemia- resolving Patient has a history of menorrhagia and H/H of 12.7/41.6 with MCV 66.7 today. Patient states that she has not had any abnormal bleeding since May of this year. Improving with diuresis.  Iron studies revealed low iron to 20, high TIBC to 406, low ferritin to 8, confirming iron deficiency anemia.  Ferrous sulfate p.o. started today. - monitor AM CBC  - ferrous sulfate PO  Medication adherence Patient does not have insurance and there is concern for her ability to access medication in outpatient setting. Will consult social work and appreciate recommendations for how best to assist this patient, as disposition, long term wellness, and prevention of readmission is dependent upon her adherence to medication regimen. - consult SW, appreciate recommendations -We will send medications to transitions of care pharmacy on discharge  HTN SBP 90-120, DBP 70-90s. Managed with amlodipine, HCTZ, and carvedilol. - continuecarvedilol, hold HCTZ - discontinue amlodipine  OSA Pt does not use CPAP at home due to intolerance. May offer it to her if she prefers while in the hospital.  Asthma Patient is not actively wheezing. - may continueVentolin 2 puff q6h PRNif  needed  Anxiety Managed with hydroxyzine and trazodone. - continue home medications. - will discuss options for outpatient treatment as part of her disposition care. - Consult social work for resources  FEN/GI: heart healthy diet, fluid restriction 1,500 mL PPx: warfarin  Disposition: To home when fluid status improves. Appreciate SW recommendations on access to medications and resources for mental health.  Subjective:  Ms. Lemire reports that she continues to feel better.  She thinks that her abdominal swelling is improved and her legs are swelling less as well.  She is very somnolent today would not answer as many questions as normal, states that she is "just tired."  Objective: Temp:  [97.6 F (36.4 C)-98 F (36.7 C)] 98 F (36.7 C) (09/07 1237) Pulse Rate:  [71-78] 77 (09/07 1237) Resp:  [16-20] 18 (09/07 1237) BP: (80-107)/(57-83) 92/57 (09/07 1237) SpO2:  [93 %-99 %] 93 % (09/07 1237) Weight:  [85.5 kg] 85.5 kg (09/07 0600) Physical Exam: General: Well-appearing, resting in bed comfortably, NAD Cardiovascular: RRR, no MRG Respiratory: CTAB, no crackles, comfortable work of breathing on room air Abdomen: soft, NT, nondistended Extremities: unable to assess 2/2 unna boots Neuro: Flat affect, improved mood  Laboratory: Recent Labs  Lab 03/30/19 0629 03/31/19 0808 04/01/19 0328  WBC 6.3 6.5 7.0  HGB 11.1* 12.9 12.7  HCT 36.7 42.9 41.6  PLT 270 266 282   Recent Labs  Lab 03/30/19 0629 03/31/19 0808 04/01/19 0328  NA 135 133* 132*  K 3.8 3.6 3.3*  CL 94* 84* 84*  CO2 29 35* 36*  BUN 13 13 15   CREATININE 0.96 1.05* 1.24*  CALCIUM 9.1 9.3 9.2  GLUCOSE 114* 135* 115*   BNP 711  Imaging/Diagnostic Tests: No results found.  Gladys Damme, MD 04/01/2019, 1:50 PM PGY-1, [place] Intern pager: [phone], text pages welcome

## 2019-04-02 ENCOUNTER — Encounter (HOSPITAL_COMMUNITY): Payer: Self-pay | Admitting: Cardiology

## 2019-04-02 LAB — COMPREHENSIVE METABOLIC PANEL
ALT: 14 U/L (ref 0–44)
AST: 28 U/L (ref 15–41)
Albumin: 2.6 g/dL — ABNORMAL LOW (ref 3.5–5.0)
Alkaline Phosphatase: 139 U/L — ABNORMAL HIGH (ref 38–126)
Anion gap: 13 (ref 5–15)
BUN: 15 mg/dL (ref 6–20)
CO2: 30 mmol/L (ref 22–32)
Calcium: 9.2 mg/dL (ref 8.9–10.3)
Chloride: 90 mmol/L — ABNORMAL LOW (ref 98–111)
Creatinine, Ser: 1.01 mg/dL — ABNORMAL HIGH (ref 0.44–1.00)
GFR calc Af Amer: >60 mL/min (ref >60)
GFR calc non Af Amer: >60 mL/min (ref >60)
Glucose, Bld: 131 mg/dL — ABNORMAL HIGH (ref 70–99)
Potassium: 3.9 mmol/L (ref 3.5–5.1)
Sodium: 133 mmol/L — ABNORMAL LOW (ref 135–145)
Total Bilirubin: 1.6 mg/dL — ABNORMAL HIGH (ref 0.3–1.2)
Total Protein: 7.8 g/dL (ref 6.5–8.1)

## 2019-04-02 LAB — CBC
HCT: 41.9 % (ref 36.0–46.0)
Hemoglobin: 12.6 g/dL (ref 12.0–15.0)
MCH: 20.2 pg — ABNORMAL LOW (ref 26.0–34.0)
MCHC: 30.1 g/dL (ref 30.0–36.0)
MCV: 67.3 fL — ABNORMAL LOW (ref 80.0–100.0)
Platelets: 276 10*3/uL (ref 150–400)
RBC: 6.23 MIL/uL — ABNORMAL HIGH (ref 3.87–5.11)
RDW: 22.2 % — ABNORMAL HIGH (ref 11.5–15.5)
WBC: 10 10*3/uL (ref 4.0–10.5)
nRBC: 0 % (ref 0.0–0.2)

## 2019-04-02 LAB — GLUCOSE, CAPILLARY
Glucose-Capillary: 137 mg/dL — ABNORMAL HIGH (ref 70–99)
Glucose-Capillary: 148 mg/dL — ABNORMAL HIGH (ref 70–99)

## 2019-04-02 LAB — PROTIME-INR
INR: 1.9 — ABNORMAL HIGH (ref 0.8–1.2)
Prothrombin Time: 21.2 seconds — ABNORMAL HIGH (ref 11.4–15.2)

## 2019-04-02 LAB — APTT: aPTT: 33 seconds (ref 24–36)

## 2019-04-02 MED ORDER — TORSEMIDE 20 MG PO TABS: 40.0000 mg | ORAL_TABLET | Freq: Every day | ORAL | 3 refills | Status: DC

## 2019-04-02 MED ORDER — WARFARIN SODIUM 7.5 MG PO TABS: 7.5000 mg | ORAL_TABLET | Freq: Every day | ORAL | Status: DC

## 2019-04-02 MED ORDER — SACUBITRIL-VALSARTAN 24-26 MG PO TABS: 1.0000 | ORAL_TABLET | Freq: Two times a day (BID) | ORAL | 3 refills | Status: DC

## 2019-04-02 MED ORDER — GABAPENTIN 100 MG PO CAPS: 100.0000 mg | ORAL_CAPSULE | Freq: Three times a day (TID) | ORAL | 0 refills | Status: DC

## 2019-04-02 MED ORDER — FERROUS SULFATE 325 (65 FE) MG PO TABS: 325.0000 mg | ORAL_TABLET | ORAL | 0 refills | Status: DC

## 2019-04-02 MED ORDER — CARVEDILOL 6.25 MG PO TABS: 6.2500 mg | ORAL_TABLET | Freq: Two times a day (BID) | ORAL | 3 refills | Status: DC

## 2019-04-02 MED ORDER — POTASSIUM CHLORIDE CRYS ER 20 MEQ PO TBCR: 20.0000 meq | EXTENDED_RELEASE_TABLET | Freq: Every day | ORAL | 3 refills | Status: DC

## 2019-04-02 MED ORDER — ATORVASTATIN CALCIUM 40 MG PO TABS: 40.0000 mg | ORAL_TABLET | Freq: Every day | ORAL | 3 refills | Status: DC

## 2019-04-02 MED ORDER — SPIRONOLACTONE 25 MG PO TABS: 25.0000 mg | ORAL_TABLET | Freq: Every day | ORAL | 3 refills | Status: DC

## 2019-04-02 MED ORDER — EMPAGLIFLOZIN 10 MG PO TABS: 10.0000 mg | ORAL_TABLET | Freq: Every day | ORAL | 0 refills | Status: DC

## 2019-04-02 MED ORDER — DIGOXIN 125 MCG PO TABS: 0.1250 mg | ORAL_TABLET | Freq: Every day | ORAL | 3 refills | Status: DC

## 2019-04-02 MED ORDER — DULOXETINE HCL 30 MG PO CPEP: 30.0000 mg | ORAL_CAPSULE | Freq: Every day | ORAL | 0 refills | Status: DC

## 2019-04-02 MED ORDER — WARFARIN SODIUM 7.5 MG PO TABS: 7.5000 mg | ORAL_TABLET | Freq: Every day | ORAL | 3 refills | Status: DC

## 2019-04-02 MED FILL — CARVEDILOL 6.25 MG TABLET: 6.25 | 30 days supply | Qty: 60 | Fill #0

## 2019-04-02 MED FILL — FERROUS SULFATE 325 MG TAB: 325 (65 FE) | 30 days supply | Qty: 15 | Fill #0

## 2019-04-02 MED FILL — ATORVASTATIN CALCIUM 40 MG: 40 | 30 days supply | Qty: 30 | Fill #0

## 2019-04-02 MED FILL — WARFARIN SODIUM 7.5 MG TAB: 7.5 | 30 days supply | Qty: 30 | Fill #0

## 2019-04-02 MED FILL — SPIRONOLACTONE 25 MG TABLET: 25 | 30 days supply | Qty: 30 | Fill #0

## 2019-04-02 MED FILL — TORSEMIDE 20 MG TABLET: 20 | 30 days supply | Qty: 60 | Fill #0

## 2019-04-02 MED FILL — GABAPENTIN 100 MG CAPSULE: 100 | 30 days supply | Qty: 90 | Fill #0

## 2019-04-02 MED FILL — POTASSIUM CL ER 20 MEQ TABL: 20 | 30 days supply | Qty: 30 | Fill #0

## 2019-04-02 MED FILL — JARDIANCE 10 MG TABLET: 10 | 30 days supply | Qty: 30 | Fill #0

## 2019-04-02 MED FILL — DULoxetine HCL 30 MG CPEP: 30 | 30 days supply | Qty: 30 | Fill #0

## 2019-04-02 MED FILL — DIGOXIN 0.125 MG TABLET: 125 | 30 days supply | Qty: 30 | Fill #0

## 2019-04-02 NOTE — TOC Transition Note (Signed)
Transition of Care [facility]) - CM/SW Discharge Note   Patient Details  Name: [name] MRN: [id number] Date of Birth: 1975-09-19  [name] CM/SW Contact:  Zenon Mayo, RN Phone Number: 04/02/2019, 4:17 PM   Clinical Narrative:    From home with adult and minor children, she has a PCP , Dr. Criss Rosales that she would like to keep, she does not have insurance, and will need ast with meds at dc. She does have a card on her that she can pay for her meds with when [name] fills scripts. Also she would like [name] for CHF disease management. [name] is doing charity this week. Referral made to [name] with [name] for [facility]. She will check to make sure patient qualifies for charity.  Per [name] , they are able to take patient for charity for [name]. Patient will be on coumadin.   Final next level of care: [name]: (S) No Barriers Identified   Patient Goals and CMS Choice Patient states their goals for this hospitalization and ongoing recovery are:: get better CMS Medicare.gov Compare Post Acute Care list provided to:: Patient Choice offered to / list presented to : Patient  Discharge Placement                       Discharge Plan and Services In-house Referral: NA Discharge Planning Services: CM Consult Post Acute Care Choice: Home Health          DME Arranged: (NA)         HH Arranged: RN, Disease Management [name] Agency: [name] ([name]) Date HH Agency Contacted: 03/29/19 Time [place]: [phone] Representative spoke with at [place]: valerie  Social Determinants of Health ([place]) Interventions     Readmission Risk Interventions No flowsheet data found.

## 2019-04-02 NOTE — TOC Progression Note (Addendum)
Transition of Care [facility]) - Progression Note    Patient Details  Name: [name] MRN: [id number] Date of Birth: [birth year]  Transition of Care [facility]) CM/SW Contact  Zenon Mayo, RN Phone Number: 04/02/2019, 3:44 PM  Clinical Narrative:    From home with adult and minor children, she has a PCP , Dr. Criss Rosales that she would like to keep, she does not have insurance, and will need ast with meds at dc.  She does have a card on her that she can pay for her meds with when [place] fills scripts.  Also she would like [facility] for CHF disease management.  [facility] is doing charity this week.  Referral made to [facility] with [facility] for [facility].  She will check to make sure patient qualifies for charity.  Per [name] , they are able to take patient for charity for [facility]. Patient will be on coumadin.   Expected Discharge Plan: [place] Barriers to Discharge: No Barriers Identified  Expected Discharge Plan and Services Expected Discharge Plan: [place] In-house Referral: NA Discharge Planning Services: CM Consult Post Acute Care Choice: [place] arrangements for the past 2 months: [place] Expected Discharge Date: 04/02/19               DME Arranged: (NA)         HH Arranged: RN, Disease Management [place] Agency: [place] ([name]) Date HH Agency Contacted: 03/29/19 Time [place]: [phone] Representative spoke with at [place]: [place] ([place]) Interventions    Readmission Risk Interventions No flowsheet data found.

## 2019-04-02 NOTE — Progress Notes (Signed)
[name] for warfarin Indication: LV Thrombus  Allergies  Allergen Reactions  . Shellfish Allergy Swelling    Airway involvement including EMS - Has Epi-Pen  . Metformin And Related Diarrhea    Patient Measurements: Height: 4\' 9"  (144.8 cm) Weight: 179 lb 3.2 oz (81.3 kg) IBW/kg (Calculated) : 38.6 Heparin Dosing Weight: 64.4 kg  Vital Signs: Temp: 98.4 F (36.9 C) (09/08 1215) Temp Source: Oral (09/08 1215) BP: 123/94 (09/08 1215) Pulse Rate: 90 (09/08 1215)  Labs: Recent Labs    03/30/19 1513  03/31/19 0808 04/01/19 0328 04/02/19 0522  HGB  --    < > 12.9 12.7 12.6  HCT  --   --  42.9 41.6 41.9  PLT  --   --  266 282 276  APTT 89*  --  114* 116* 33  LABPROT  --   --  19.1* 23.1* 21.2*  INR  --   --  1.6* 2.1* 1.9*  HEPARINUNFRC 0.99*  --  1.05* 0.86*  --   CREATININE  --   --  1.05* 1.24* 1.01*   < > = values in this interval not displayed.    Estimated Creatinine Clearance: 64.5 mL/min (A) (by C-G formula based on SCr of 1.01 mg/dL (H)).   Medical History: Past Medical History:  Diagnosis Date  . Abnormal Pap smear   . Anemia   . Asthma   . Atrial fibrillation ([place])   . Diabetes mellitus   . Heart failure with reduced ejection fraction ([place])   . Hypertension   . 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    . Migraines   . Morbid obesity ([place]) 02/06/2019  . Obstructive sleep apnea 06/30/2015  . Polycystic ovarian disease     Assessment: 43 year old female admitted with SOB and new LV thrombus. Patient was on Xarelto prior to admission (last dose 9/2 at 1830) for history of atrial fibrillation but transitioned to heparin drip on admission awaiting cardiac cath.   Post-cath, pharmacy consulted for heparin bridge to warfarin. INR subtherapeutic, no bleeding noted, CBC stable.     Goal of Therapy:  INR  2-3 Monitor platelets by anticoagulation protocol: Yes     Plan:  Warfarin 7.5 mg PO daily until INR checked at warfarin clinic. Follow S/Sx bleeding      Valerie Fredin L. Devin Going, PharmD, [place] PGY1 Pharmacy Resident 04/02/19      12:36 PM  Please check AMION for all [place] phone numbers After 10:00 PM, call the [place] [phone]

## 2019-04-02 NOTE — Discharge Instructions (Signed)
While in the hospital you were treated for heart failure, and 40 pounds of water was removed.  It is very important that you follow a strict diet that limits the amount of fluid you drink every day (1500 mL per day), the amount of salt that you take (less than 1500 mg per day).  1. Please follow up with your cardiologist.  You were sent home on a number of new medications to control your heart failure, please see the list to follow exactly.  These medications were given to you at bedside before you left the hospital.  2.  A clot was found in your heart while you were in the hospital, so the Xarelto you have been on was stopped and you are transitioned to warfarin.  Please follow-up with the warfarin clinic as you need testing every week to make sure that you do not get a new clot.  3.  You also have diabetes, which was very easy to treat in the hospital.  You did not need much insulin.  Therefore, you were transitioned to [place], a once daily medication you take by mouth.  Please follow-up in the family medicine clinic to finish the orange card paperwork to obtain more of this medication.  4.  We also started you on a medication called duloxetine for your depression.  It will take about 4 weeks for this medication to work so it is very important that you continue to take it.  5. Please take gabapentin 100 mg three times a day to control your leg pain.  6. Home health nursing was also started and they will contact you to help manage your medications and diet.   Low-Sodium Eating Plan Sodium, which is an element that makes up salt, helps you maintain a healthy balance of fluids in your body. Too much sodium can increase your blood pressure and cause fluid and waste to be held in your body. Your health care provider or dietitian may recommend following this plan if you have high blood pressure (hypertension), kidney disease, liver disease, or heart failure. Eating less sodium can help lower your  blood pressure, reduce swelling, and protect your heart, liver, and kidneys. What are tips for following this plan? General guidelines  Most people on this plan should limit their sodium intake to 1,500-2,000 mg (milligrams) of sodium each day. Reading food labels   The Nutrition Facts label lists the amount of sodium in one serving of the food. If you eat more than one serving, you must multiply the listed amount of sodium by the number of servings.  Choose foods with less than 140 mg of sodium per serving.  Avoid foods with 300 mg of sodium or more per serving. Shopping  Look for lower-sodium products, often labeled as "low-sodium" or "no salt added."  Always check the sodium content even if foods are labeled as "unsalted" or "no salt added".  Buy fresh foods. ? Avoid canned foods and premade or frozen meals. ? Avoid canned, cured, or processed meats  Buy breads that have less than 80 mg of sodium per slice. Cooking  Eat more home-cooked food and less restaurant, buffet, and fast food.  Avoid adding salt when cooking. Use salt-free seasonings or herbs instead of table salt or sea salt. Check with your health care provider or pharmacist before using salt substitutes.  Cook with plant-based oils, such as canola, sunflower, or olive oil. Meal planning  When eating at a restaurant, ask that your food be prepared  with less salt or no salt, if possible.  Avoid foods that contain MSG (monosodium glutamate). MSG is sometimes added to [place] food, bouillon, and some canned foods. What foods are recommended? The items listed may not be a complete list. Talk with your dietitian about what dietary choices are best for you. Grains Low-sodium cereals, including oats, puffed wheat and rice, and shredded wheat. Low-sodium crackers. Unsalted rice. Unsalted pasta. Low-sodium bread. Whole-grain breads and whole-grain pasta. Vegetables Fresh or frozen vegetables. "No salt added" canned  vegetables. "No salt added" tomato sauce and paste. Low-sodium or reduced-sodium tomato and vegetable juice. Fruits Fresh, frozen, or canned fruit. Fruit juice. Meats and other protein foods Fresh or frozen (no salt added) meat, poultry, seafood, and fish. Low-sodium canned tuna and salmon. Unsalted nuts. Dried peas, beans, and lentils without added salt. Unsalted canned beans. Eggs. Unsalted nut butters. Dairy Milk. Soy milk. Cheese that is naturally low in sodium, such as ricotta cheese, fresh mozzarella, or Swiss cheese Low-sodium or reduced-sodium cheese. Cream cheese. Yogurt. Fats and oils Unsalted butter. Unsalted margarine with no trans fat. Vegetable oils such as canola or olive oils. Seasonings and other foods Fresh and dried herbs and spices. Salt-free seasonings. Low-sodium mustard and ketchup. Sodium-free salad dressing. Sodium-free light mayonnaise. Fresh or refrigerated horseradish. Lemon juice. Vinegar. Homemade, reduced-sodium, or low-sodium soups. Unsalted popcorn and pretzels. Low-salt or salt-free chips. What foods are not recommended? The items listed may not be a complete list. Talk with your dietitian about what dietary choices are best for you. Grains Instant hot cereals. Bread stuffing, pancake, and biscuit mixes. Croutons. Seasoned rice or pasta mixes. Noodle soup cups. Boxed or frozen macaroni and cheese. Regular salted crackers. Self-rising flour. Vegetables Sauerkraut, pickled vegetables, and relishes. Olives. [place] fries. Onion rings. Regular canned vegetables (not low-sodium or reduced-sodium). Regular canned tomato sauce and paste (not low-sodium or reduced-sodium). Regular tomato and vegetable juice (not low-sodium or reduced-sodium). Frozen vegetables in sauces. Meats and other protein foods Meat or fish that is salted, canned, smoked, spiced, or pickled. Bacon, ham, sausage, hotdogs, corned beef, chipped beef, packaged lunch meats, salt pork, jerky, pickled  herring, anchovies, regular canned tuna, sardines, salted nuts. Dairy Processed cheese and cheese spreads. Cheese curds. Blue cheese. Feta cheese. String cheese. Regular cottage cheese. Buttermilk. Canned milk. Fats and oils Salted butter. Regular margarine. Ghee. Bacon fat. Seasonings and other foods Onion salt, garlic salt, seasoned salt, table salt, and sea salt. Canned and packaged gravies. Worcestershire sauce. Tartar sauce. Barbecue sauce. Teriyaki sauce. Soy sauce, including reduced-sodium. Steak sauce. Fish sauce. Oyster sauce. Cocktail sauce. Horseradish that you find on the shelf. Regular ketchup and mustard. Meat flavorings and tenderizers. Bouillon cubes. Hot sauce and Tabasco sauce. Premade or packaged marinades. Premade or packaged taco seasonings. Relishes. Regular salad dressings. Salsa. Potato and tortilla chips. Corn chips and puffs. Salted popcorn and pretzels. Canned or dried soups. Pizza. Frozen entrees and pot pies. Summary  Eating less sodium can help lower your blood pressure, reduce swelling, and protect your heart, liver, and kidneys.  Most people on this plan should limit their sodium intake to 1,500-2,000 mg (milligrams) of sodium each day.  Canned, boxed, and frozen foods are high in sodium. Restaurant foods, fast foods, and pizza are also very high in sodium. You also get sodium by adding salt to food.  Try to cook at home, eat more fresh fruits and vegetables, and eat less fast food, canned, processed, or prepared foods. This information is not intended to replace  advice given to you by your health care provider. Make sure you discuss any questions you have with your health care provider. Document Released: 12/31/2001 Document Revised: 06/23/2017 Document Reviewed: 07/04/2016 Elsevier Patient Education  2020 [place] on my medicine - Coumadin   (Warfarin)  This medication education was reviewed with me or my healthcare representative as part of  my discharge preparation.  The pharmacist that spoke with me during my hospital stay was:  Lajean Saver, [place]  Why was Coumadin prescribed for you? Coumadin was prescribed for you because you have a blood clot or a medical condition that can cause an increased risk of forming blood clots. Blood clots can cause serious health problems by blocking the flow of blood to the heart, lung, or brain. Coumadin can prevent harmful blood clots from forming. As a reminder your indication for Coumadin is:   Stroke Prevention Because Of Atrial Fibrillation  What test will check on my response to Coumadin? While on Coumadin (warfarin) you will need to have an INR test regularly to ensure that your dose is keeping you in the desired range. The INR (international normalized ratio) number is calculated from the result of the laboratory test called prothrombin time (PT).  If an INR APPOINTMENT HAS NOT ALREADY BEEN MADE FOR YOU please schedule an appointment to have this lab work done by your health care provider within 7 days. Your INR goal is usually a number between:  2 to 3 or your provider may give you a more narrow range like 2-2.5.  Ask your health care provider during an office visit what your goal INR is.  What  do you need to  know  About  COUMADIN? Take Coumadin (warfarin) exactly as prescribed by your healthcare provider about the same time each day.  DO NOT stop taking without talking to the doctor who prescribed the medication.  Stopping without other blood clot prevention medication to take the place of Coumadin may increase your risk of developing a new clot or stroke.  Get refills before you run out.  What do you do if you miss a dose? If you miss a dose, take it as soon as you remember on the same day then continue your regularly scheduled regimen the next day.  Do not take two doses of Coumadin at the same time.  Important Safety Information A possible side effect of Coumadin (Warfarin) is an  increased risk of bleeding. You should call your healthcare provider right away if you experience any of the following: ? Bleeding from an injury or your nose that does not stop. ? Unusual colored urine (red or dark brown) or unusual colored stools (red or black). ? Unusual bruising for unknown reasons. ? A serious fall or if you hit your head (even if there is no bleeding).  Some foods or medicines interact with Coumadin (warfarin) and might alter your response to warfarin. To help avoid this: ? Eat a balanced diet, maintaining a consistent amount of Vitamin K. ? Notify your provider about major diet changes you plan to make. ? Avoid alcohol or limit your intake to 1 drink for women and 2 drinks for men per day. (1 drink is 5 oz. wine, 12 oz. beer, or 1.5 oz. liquor.)  Make sure that ANY health care provider who prescribes medication for you knows that you are taking Coumadin (warfarin).  Also make sure the healthcare provider who is monitoring your Coumadin knows when you have started a  new medication including herbals and non-prescription products.  Coumadin (Warfarin)  Major Drug Interactions  Increased Warfarin Effect Decreased Warfarin Effect  Alcohol (large quantities) Antibiotics (esp. Septra/Bactrim, Flagyl, Cipro) Amiodarone (Cordarone) Aspirin (ASA) Cimetidine (Tagamet) Megestrol (Megace) NSAIDs (ibuprofen, naproxen, etc.) Piroxicam (Feldene) Propafenone (Rythmol SR) Propranolol (Inderal) Isoniazid (INH) Posaconazole (Noxafil) Barbiturates (Phenobarbital) Carbamazepine (Tegretol) Chlordiazepoxide (Librium) Cholestyramine (Questran) Griseofulvin Oral Contraceptives Rifampin Sucralfate (Carafate) Vitamin K   Coumadin (Warfarin) Major Herbal Interactions  Increased Warfarin Effect Decreased Warfarin Effect  Garlic Ginseng Ginkgo biloba Coenzyme Q10 Green tea [place]. [place] wort    Coumadin (Warfarin) FOOD Interactions  Eat a consistent number of servings per week  of foods HIGH in Vitamin K (1 serving =  cup)  Collards (cooked, or boiled & drained) Kale (cooked, or boiled & drained) Mustard greens (cooked, or boiled & drained) Parsley *serving size only =  cup Spinach (cooked, or boiled & drained) Swiss chard (cooked, or boiled & drained) Turnip greens (cooked, or boiled & drained)  Eat a consistent number of servings per week of foods MEDIUM-HIGH in Vitamin K (1 serving = 1 cup)  Asparagus (cooked, or boiled & drained) Broccoli (cooked, boiled & drained, or raw & chopped) Brussel sprouts (cooked, or boiled & drained) *serving size only =  cup Lettuce, raw (green leaf, endive, romaine) Spinach, raw Turnip greens, raw & chopped   These websites have more information on Coumadin (warfarin):  FailFactory.se; VeganReport.com.au;

## 2019-04-02 NOTE — Progress Notes (Signed)
Readmission risk assessment completed 

## 2019-04-02 NOTE — Progress Notes (Signed)
MATCH reinstatement requested. [place], [place] [facility] TOC CM [phone]

## 2019-04-02 NOTE — Progress Notes (Signed)
Patient ID: [name], female   DOB: December 30, 1975, 43 y.o.   MRN: [id number]     Advanced Heart Failure Rounding Note  PCP-Cardiologist: No primary care provider on file.   Subjective:    Now on po diuretics, weight down again.  Breathing improved.  BP down in 0000000 systolic some yesterday but no lightheadedness and creatinine stable. BP fine this morning.   RHC/LHC Coronary Findings Diagnostic Dominance: Right Left Main  No significant disease.  Left Anterior Descending  Luminal irregularities.  Left Circumflex  Luminal irregularities.  Right Coronary Artery  Extensive luminal irregularities.  Intervention  No interventions have been documented. Right Heart  Right Heart Pressures RHC Procedural Findings: Hemodynamics (mmHg) RA mean 18 RV 48/23 PA 49/30, mean 36 PCWP mean 23  Oxygen saturations: PA 57% AO 100%  Cardiac Output (Fick) 3.92  Cardiac Index (Fick) 2.07 PVR 3.3 WU PAPI 1.05      Objective:   Weight Range: 81.3 kg Body mass index is 38.78 kg/m.   Vital Signs:   Temp:  [97.6 F (36.4 C)-98 F (36.7 C)] 97.6 F (36.4 C) (09/08 0518) Pulse Rate:  [77-95] 95 (09/08 0518) Resp:  [16-18] 16 (09/08 0518) BP: (92-117)/(57-89) 117/89 (09/08 0518) SpO2:  [92 %-100 %] 92 % (09/08 0518) Weight:  [81.3 kg] 81.3 kg (09/08 0518) Last BM Date: 04/01/19  Weight change: Filed Weights   03/31/19 0528 04/01/19 0600 04/02/19 0518  Weight: 86.3 kg 85.5 kg 81.3 kg    Intake/Output:   Intake/Output Summary (Last 24 hours) at 04/02/2019 0749 Last data filed at 04/02/2019 0522 Gross per 24 hour  Intake 246 ml  Output 1550 ml  Net -1304 ml      Physical Exam    General: NAD Neck: No JVD, no thyromegaly or thyroid nodule.  Lungs: Clear to auscultation bilaterally with normal respiratory effort. CV: Nondisplaced PMI.  Heart regular S1/S2, no S3/S4, no murmur.  No peripheral edema, legs wrapped.  Abdomen: Soft, nontender, no hepatosplenomegaly, no  distention.  Skin: Intact without lesions or rashes.  Neurologic: Alert and oriented x 3.  Psych: Normal affect. Extremities: No clubbing or cyanosis.  HEENT: Normal.    Telemetry   NSR 80s (personally reviewed)  Labs    CBC Recent Labs    04/01/19 0328 04/02/19 0522  WBC 7.0 10.0  HGB 12.7 12.6  HCT 41.6 41.9  MCV 66.0* 67.3*  PLT 282 [id number]   Basic Metabolic Panel Recent Labs    04/01/19 0328 04/02/19 0522  NA 132* 133*  K 3.3* 3.9  CL 84* 90*  CO2 36* 30  GLUCOSE 115* 131*  BUN 15 15  CREATININE 1.24* 1.01*  CALCIUM 9.2 9.2   Liver Function Tests Recent Labs    04/02/19 0522  AST 28  ALT 14  ALKPHOS 139*  BILITOT 1.6*  PROT 7.8  ALBUMIN 2.6*   No results for input(s): LIPASE, AMYLASE in the last 72 hours. Cardiac Enzymes No results for input(s): CKTOTAL, CKMB, CKMBINDEX, TROPONINI in the last 72 hours.  BNP: BNP (last 3 results) Recent Labs    12/25/18 1026 02/19/19 2026 03/27/19 1302  BNP 401.2* 654.7* 711.2*    ProBNP (last 3 results) No results for input(s): PROBNP in the last 8760 hours.   D-Dimer No results for input(s): DDIMER in the last 72 hours. Hemoglobin A1C No results for input(s): HGBA1C in the last 72 hours. Fasting Lipid Panel No results for input(s): CHOL, HDL, LDLCALC, TRIG, CHOLHDL, LDLDIRECT in the  last 72 hours. Thyroid Function Tests No results for input(s): TSH, T4TOTAL, T3FREE, THYROIDAB in the last 72 hours.  Invalid input(s): FREET3  Other results:   Imaging    No results found.   Medications:     Scheduled Medications: . atorvastatin  40 mg Oral q1800  . carvedilol  6.25 mg Oral BID  . digoxin  0.125 mg Oral Daily  . DULoxetine  30 mg Oral Daily  . ferrous sulfate  325 mg Oral QODAY  . gabapentin  100 mg Oral Q8H  . insulin aspart  0-9 Units Subcutaneous TID WC  . potassium chloride  20 mEq Oral Daily  . sacubitril-valsartan  1 tablet Oral BID  . sodium chloride flush  3 mL Intravenous Q12H   . sodium chloride flush  3 mL Intravenous Q12H  . sodium chloride flush  3 mL Intravenous Q12H  . spironolactone  25 mg Oral Daily  . torsemide  40 mg Oral Daily  . Warfarin - Pharmacist Dosing Inpatient   Does not apply q1800    Infusions: . sodium chloride    . sodium chloride      PRN Medications: sodium chloride, sodium chloride, [COMPLETED] dicyclomine **FOLLOWED BY** acetaminophen, hydrOXYzine, ondansetron (ZOFRAN) IV, sodium chloride flush, sodium chloride flush, traZODone   Assessment/Plan   1. Acute on chronic systolic CHF:  Nonischemic cardiomyopathy, diagnosed in 6/20.  Echo this admission shows EF 20-25%, diffuse hypokinesis, LV thrombus, moderate RV systolic dysfunction, mild MR, moderate TR.  She has a history of paroxysmal atrial fibrillation but seems to be mostly in sinus rhythm.  This is not likely to be a tachycardia-mediated cardiomyopathy.  Poorly controlled diabetes itself can cause a cardiomyopathy, as can poorly controlled HTN.  Myocarditis is a potential cause of her cardiomyopathy. 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  RHC showed R>L heart failure with low PAPI suggesting significant RV dysfunction.  She has diuresed well, volume status significantly improved.  - Continue torsemide 40 mg daily.   - Continue Entresto 24/26 bid. No BP room to increase.  - Continue spironolactone 25 mg daily.  - Continue Coreg 6.25 mg bid.  - Continue digoxin.  - She has been trying to get pregnant (has been difficult with polycystic ovarian syndrome).  She has 2 grown children already.  We have discussed this.  With her cardiomyopathy and active CHF, pregnancy would create a significant mortality risk for her.  A number of cardiac meds that she would ideally be on to control her CHF are feto-toxic.  I recommended that she NOT try to get pregnant at this point and use a form of birth control.  She has agreed with this.  2. LV thrombus: LV thrombus noted on echo this  admission.  She says that she has been compliant with Xarelto.   - Xarelto has been stopped and she has been transitioned to warfarin.   3. Atrial fibrillation: Paroxysmal. She has been in NSR this admission.  - Continue warfarin.   - Would consider atrial fibrillation ablation in the future given CASTLE-HF data.  4. Type 2 diabetes: Poor control, per primary service.  5. Hypertension: BP now on the lower side.    6. Disposition: Suspect she can go home today.  Will need followup in coumadin clinic and in CHF clinic, will arrange.  Will need assistance with meds.  Cardiac meds for home: Warfarin per coumadin clinic, Entresto 24/26 bid, torsemide 40 daily, Coreg 6.25 mg bid, KCl 20 daily, spironolactone 25 daily, digoxin  0.125 daily, atorvastatin 40 daily.   Length of Stay: 6  Loralie Champagne, MD  04/02/2019, 7:49 AM  Advanced Heart Failure Team Pager [phone] (M-F; 7a - 4p)  Please contact [facility] for night-coverage after hours (4p -7a ) and weekends on [email or web address]

## 2019-04-04 ENCOUNTER — Encounter: Payer: Self-pay | Admitting: Family Medicine

## 2019-04-05 ENCOUNTER — Inpatient Hospital Stay: Payer: Self-pay | Admitting: Family Medicine

## 2019-04-05 NOTE — Progress Notes (Deleted)
   Subjective:    Patient ID: [name], female    DOB: 1975-09-10, 43 y.o.   MRN: [id number]   CC: "hospital f/u"  HPI: [name] is a 43 year old female with a history of HFrEF, poorly controlled diabetes, MDD, hypertension, and paroxysmal atrial fibrillation presenting discuss the following:  Hospital follow-up: Admitted from 9/2 to 9/7 for acute HFrEF exacerbation.  Received extensive diuresis and additionally had right heart cath, showing nonobstructive CAD and right ventricular failure.  She was discharged on torsemide 40 mg daily and low sodium diet under 1.5 g.  Discharge weight 179 pounds.    1. AoC HFrEF (EF 20-25%) -follow-up with cardiology. Diet- salt restriction < 1500 mg/day, will need close follow up and support on adhering to this diet 2. Ventricular thrombus-transition to warfarin, will follow-up in heart failure warfarin clinic 3. [place] Product manager- f/u with Leory Plowman in [facility] to assist 4. Depression - started on Duloxetine 30 mg, consider increasing dose 5. Diabetes - started on jardiance and received 30 day supply in hospital. Closely monitor BG. 6. Check weight and ask about diet/medication adherence   Smoking status reviewed  Review of Systems Per HPI    Objective:  There were no vitals taken for this visit. Vitals and nursing note reviewed  General: NAD, pleasant Cardiac: RRR, normal heart sounds, no murmurs Respiratory: CTAB, normal effort Abdomen: soft, nontender, nondistended Extremities: no edema or cyanosis. WWP. Skin: warm and dry, no rashes noted Neuro: alert and oriented, no focal deficits Psych: normal affect  Assessment & Plan:    No problem-specific Assessment & Plan notes found for this encounter.    [place] [facility] Resident PGY-2

## 2019-04-08 ENCOUNTER — Telehealth: Payer: Self-pay | Admitting: *Deleted

## 2019-04-08 NOTE — Telephone Encounter (Signed)
Katie calls to report the following:  She attempted to do a start of care visit this week, but pt is out of town till Friday.  She also wants to let MD know that pt missed her appt at the advanced heart failure clinic and her coumadin check. Christen Bame, CMA

## 2019-04-09 NOTE — Telephone Encounter (Signed)
I called patient's phone and left a hippa compliant message saying that I have been getting multiple notices that she has missed multiple appointments.  I described that I am concerned about her health and that it is impossible for [place] to take care of her if she does not come to these appointments.  She was instructed to please call her doctors and reschedule appointment so that we can take care of her.  Dr. Criss Rosales

## 2019-04-11 ENCOUNTER — Encounter (HOSPITAL_COMMUNITY): Payer: Self-pay

## 2019-04-22 ENCOUNTER — Other Ambulatory Visit: Payer: Self-pay | Admitting: Family Medicine

## 2019-05-23 ENCOUNTER — Telehealth: Payer: Self-pay | Admitting: *Deleted

## 2019-05-23 DIAGNOSIS — N898 Other specified noninflammatory disorders of vagina: Secondary | ICD-10-CM

## 2019-05-23 MED FILL — SPIRONOLACTONE 25 MG TABS: 25 | 30 days supply | Qty: 30 | Fill #0

## 2019-05-23 MED FILL — DIGOXIN 0.125 MG TABLET: 125 | 30 days supply | Qty: 30 | Fill #0

## 2019-05-23 MED FILL — TORSEMIDE 20 MG TABLET: 20 | 30 days supply | Qty: 60 | Fill #0

## 2019-05-23 MED FILL — POTASSIUM CHLORIDE CRYS ER: 20 | 30 days supply | Qty: 30 | Fill #0

## 2019-05-23 MED FILL — ATORVASTATIN 40 MG TABLET: 40 | 30 days supply | Qty: 30 | Fill #0

## 2019-05-23 MED FILL — WARFARIN SODIUM 7.5 MG TAB: 7.5 | 30 days supply | Qty: 30 | Fill #0

## 2019-05-23 MED FILL — CARVEDILOL 6.25 MG TABLET: 6.25 | 30 days supply | Qty: 60 | Fill #0

## 2019-05-23 NOTE — Telephone Encounter (Signed)
Pt states that she has a yeast infections (Itching and white discahrge) regularly, she attributes this to her DM and many medications.  She denies any odor.  In the past when she tried OTC she feels that it made the infection worse.  She is requesting a script for diflucan. Christen Bame, CMA

## 2019-05-23 NOTE — Telephone Encounter (Signed)
Please call patient, "Dr. Criss Rosales saw your request for medication, he'd like you to come in and see one of the doctors because the medication you are requesting can interact with warfarin and your INR hasn't been checked because you haven't been to coumadin clinic.  We can schedule something with you now.  He wanted [place] to remind you that you need to be going to the cardiologist.  The records indicate you missed two appts.   You have significant levels of heart failure and avoiding followup can end up with you being hospitalized or even dying.  Please go see the cardiologist too."  -Dr. Criss Rosales

## 2019-05-24 NOTE — Telephone Encounter (Signed)
Called pt. Scheduled pt an appt for Monday. Ottis Stain, CMA

## 2019-05-24 NOTE — Telephone Encounter (Signed)
LVM to call office to inform pt of below and to assist in getting an appointment scheduled. April Zimmerman Rumple, CMA  

## 2019-05-24 NOTE — Telephone Encounter (Signed)
Patient LVM on nurse line asking for April.

## 2019-05-27 ENCOUNTER — Ambulatory Visit: Payer: Medicaid Other

## 2019-05-28 NOTE — Progress Notes (Deleted)
   Subjective:    Patient ID: [name], female    DOB: 18-Mar-1976, 43 y.o.   MRN: [id number]   CC:  HPI: Diabetes Fasting checks: *** Post prandial ***  Compliance: *** Diet: ***  Exercise: *** Eye exam: *** Foot exam: *** A1C: *** Symptoms: *** symptoms of hypoglycemia. *** symptoms of  polyuria, polydipsia. *** numbness in extremities, and *** foot ulcers/trauma Meds: jardiance 10mg ,  Pneumonia vaccine: *** Urine micro albumin:creatine ratio: ***  Monitoring Labs and Parameters Last A1C:  Lab Results  Component Value Date   HGBA1C >15.5 (H) 02/06/2019   Last Lipid:     Component Value Date/Time   CHOL 266 (H) 10/20/2017 1054   HDL 43 10/20/2017 1054   LDLDIRECT 154 (H) 01/20/2014 1522   Last Bmet  Potassium  Date Value Ref Range Status  04/02/2019 3.9 3.5 - 5.1 mmol/L Final   Sodium  Date Value Ref Range Status  04/02/2019 133 (L) 135 - 145 mmol/L Final  02/05/2019 128 (L) 134 - 144 mmol/L Final   Creat  Date Value Ref Range Status  06/11/2015 0.71 0.50 - 1.10 mg/dL Final   Creatinine, Ser  Date Value Ref Range Status  04/02/2019 1.01 (H) 0.44 - 1.00 mg/dL Final      Smoking status reviewed  Review of Systems   Objective:  There were no vitals taken for this visit. Vitals and nursing note reviewed  General: well nourished, in no acute distress HEENT: normocephalic, TM's visualized bilaterally, no scleral icterus or conjunctival pallor, no nasal discharge, moist mucous membranes, good dentition without erythema or discharge noted in posterior oropharynx Neck: supple, non-tender, without lymphadenopathy Cardiac: RRR, clear S1 and S2, no murmurs, rubs, or gallops Respiratory: clear to auscultation bilaterally, no increased work of breathing Abdomen: soft, nontender, nondistended, no masses or organomegaly. Bowel sounds present Extremities: no edema or cyanosis. Warm, well perfused. 2+ radial and PT pulses bilaterally Skin: warm and dry, no  rashes noted Neuro: alert and oriented, no focal deficits   Assessment & Plan:    No problem-specific Assessment & Plan notes found for this encounter.    No follow-ups on file.   Caroline More, DO, PGY-3

## 2019-05-29 ENCOUNTER — Ambulatory Visit: Payer: Medicaid Other

## 2019-05-30 ENCOUNTER — Telehealth: Payer: Self-pay | Admitting: *Deleted

## 2019-05-30 ENCOUNTER — Ambulatory Visit (INDEPENDENT_AMBULATORY_CARE_PROVIDER_SITE_OTHER): Payer: Self-pay | Admitting: Family Medicine

## 2019-05-30 ENCOUNTER — Other Ambulatory Visit: Payer: Self-pay

## 2019-05-30 VITALS — BP 122/84 | HR 110 | Wt 175.0 lb

## 2019-05-30 DIAGNOSIS — I480 Paroxysmal atrial fibrillation: Secondary | ICD-10-CM

## 2019-05-30 DIAGNOSIS — E119 Type 2 diabetes mellitus without complications: Secondary | ICD-10-CM

## 2019-05-30 DIAGNOSIS — I5020 Unspecified systolic (congestive) heart failure: Secondary | ICD-10-CM

## 2019-05-30 LAB — POCT GLYCOSYLATED HEMOGLOBIN (HGB A1C): HbA1c, POC (controlled diabetic range): 12.3 % — AB (ref 0.0–7.0)

## 2019-05-30 LAB — POCT INR: INR: 1 — AB (ref 2.0–3.0)

## 2019-05-30 MED ORDER — FLUCONAZOLE 150 MG PO TABS: 150.0000 mg | ORAL_TABLET | Freq: Once | ORAL | 0 refills | Status: AC

## 2019-05-30 MED ORDER — METHOCARBAMOL 500 MG PO TABS: 500.0000 mg | ORAL_TABLET | Freq: Three times a day (TID) | ORAL | 0 refills | Status: DC | PRN

## 2019-05-30 MED FILL — METHOCARBAMOL 500 MG TABS: 500 | 10 days supply | Qty: 30 | Fill #0

## 2019-05-30 MED FILL — FLUCONAZOLE 150 MG TABLET: 150 | 3 days supply | Qty: 2 | Fill #0

## 2019-05-30 NOTE — Progress Notes (Signed)
Subjective:  Patient ID: [name]  DOB: Nov 30, 1975 MRN: [id number]  CLARIBELLE SHUTTER is a 43 y.o. female with a PMH of HFrEF with EF 20-25%, atrial fibrillation on Xarelto, HTN, T2DM, asthma, and anxiety, here today for concern for yeast infection.   HPI:  HFrEF hospitalized 03/27/2019 for acute on chronic HFrEF: -Patient never had follow-up.  Patient has not followed up with cardiology as recommended after hospitalization.  She has been weighing herself diligently at home and does not feel as if she is fluid overloaded. -175lb today, left hospital at 180lb.  Paroxysmal A. fib -She does not feel as if her heart is racing.  She denies any chest pain.  She has not followed up with cardiology as above.  She has also not followed up with the INR clinic.  She states that she missed her appointment with them because she did not know where their office was located. -The patient was in the hospital last time she had a cath and was transitioned to warfarin.  Patient without insurance so believe that it was warfarin given cost of medications.  However patient is attempting to apply for the orange card and would benefit from a DOAC. -POCT INR 1.0 today.  Goal 2-3  Diabetes Medications: metformin, jardiance Compliance (Modifying factor) - yes On Aspirin, and on statin Last eye exam: needs exam ROS: denies dizziness, diaphoresis, LOC, polyuria, polydipsia  Monitoring Labs and Parameters Last A1C:  Lab Results  Component Value Date   HGBA1C 12.3 (A) 05/30/2019   ROS: As mentioned in HPI   Social hx: Denies use of illicit drugs, alcohol use Smoking status reviewed  Patient Active Problem List   Diagnosis Date Noted  . Attempting to conceive 03/10/2019  . Asthma   . Yeast infection 02/06/2019  . Right ventricular failure ([place]) 02/06/2019  . Morbid obesity ([place]) 02/06/2019  . HFrEF (heart failure with reduced ejection fraction) ([place]) 12/31/2018  . Paroxysmal atrial fibrillation ([place])  12/31/2018  . Anxiety 12/31/2018  . Hyperlipidemia associated with type 2 diabetes mellitus ([place]) 10/30/2017  . Obstructive sleep apnea 06/30/2015  . Anemia due to blood loss, chronic 06/27/2012  . Allergic rhinitis 06/27/2012  . Major depressive disorder 02/08/2012  . Hypertension associated with diabetes ([place]) 12/04/2011  . Dysmenorrhea 11/22/2011  . DM2 (diabetes mellitus, type 2) ([place]) 05/19/2011  . PCOS (polycystic ovarian syndrome) 05/19/2011  . Hydrosalpinx 08/21/2009     Objective:  BP 122/84   Pulse (!) 110   Wt 175 lb (79.4 kg)   LMP 05/20/2019 (Approximate)   SpO2 97%   BMI 37.87 kg/m   Vitals and nursing note reviewed  General: NAD, pleasant Pulm: normal effort, CTAB Cardiac: RRR, normal heart sounds, no m/r/g Extremities: no edema or cyanosis. WWP. Skin: warm and dry, no rashes noted Neuro: alert and oriented, no focal deficits Psych: normal affect, normal thought content  Assessment & Plan:   Paroxysmal atrial fibrillation (HCC) On warfarin. Unable to afford DOAC which she would benefit from. In rhythm today on exam. Asymptomatic. Working on orange card.   Patient provided with the following instructions:  Heart Failure Clinic locate on the 1st floor of [facility]. USE ENTRANCE C at the hospital Contact information: [address] [place]  Thursdays and Sundays, 2 days per week, take 1.5 tabs or 11.25 mg of your warfarin. Then every other day take 1 tab or 7.5mg . Please limit your green intake, such as cabbage as this really affects  your level.   HFrEF (heart failure with reduced ejection fraction) ([place]) Patient still has not followed up with heart failure. Encouraged follow up. She reports compliance with her medications and is weighing herself daily. Weight is 175lb today in clinic. She appears euvolemic. BMP grossly normal (Na 131 which it has previously been). Patient remains without insurance and is to work  with front office for orange card to be able to continue getting her medications. Would benefit from increase in SGLT2 therapy once she can afford medications, but do not want her to run out of the jardiance 10mg .   DM2 (diabetes mellitus, type 2) (HCC) A1c has improved to 12.3, but patient would benefit from increase in SGLT2 therapy to 25mg  daily. Orange card as above. Insulin sensitive while admitted. Encouraged routine follow up and diet management. Sx of yeast infection, given A1c and hx will treat empirically with diflucan x1. INR also subtherapeutic.    [place] Gean Laursen, DO Family Medicine Resident PGY-3

## 2019-05-30 NOTE — Telephone Encounter (Signed)
Opened in error.  Christen Bame, CMA

## 2019-05-30 NOTE — Patient Instructions (Addendum)
Thank you for coming to see me today. It was a pleasure! Today we talked about:   Stop by an ask for orange card paperwork at the front.    Heart Failure Clinic locate on the 1st floor of [facility]. USE ENTRANCE C at the hospital Contact information: [address] [place]  Thursdays and Sundays, 2 days per week, take 1.5 tabs or 11.25 mg of your warfarin. Then every other day take 1 tab or 7.5mg . Please limit your green intake, such as cabbage as this really affects your level.   Please follow-up with heart failure clinic as above as soon as possible.  If you have any questions or concerns, please do not hesitate to call the office at [phone].  Take Care,   [place] Kamon Fahr, DO

## 2019-05-31 LAB — BASIC METABOLIC PANEL
BUN/Creatinine Ratio: 7 — ABNORMAL LOW (ref 9–23)
BUN: 6 mg/dL (ref 6–24)
CO2: 20 mmol/L (ref 20–29)
Calcium: 9.5 mg/dL (ref 8.7–10.2)
Chloride: 98 mmol/L (ref 96–106)
Creatinine, Ser: 0.82 mg/dL (ref 0.57–1.00)
GFR calc Af Amer: 103 mL/min/{1.73_m2} (ref >59)
GFR calc non Af Amer: 89 mL/min/{1.73_m2} (ref >59)
Glucose: 361 mg/dL — ABNORMAL HIGH (ref 65–99)
Potassium: 4.4 mmol/L (ref 3.5–5.2)
Sodium: 131 mmol/L — ABNORMAL LOW (ref 134–144)

## 2019-06-04 ENCOUNTER — Encounter: Payer: Self-pay | Admitting: Family Medicine

## 2019-06-04 NOTE — Assessment & Plan Note (Addendum)
On warfarin. Unable to afford DOAC which she would benefit from. In rhythm today on exam. Asymptomatic. Working on orange card.   Patient provided with the following instructions:  Heart Failure Clinic locate on the 1st floor of [facility]. USE ENTRANCE C at the hospital Contact information: [address] [place]  Thursdays and Sundays, 2 days per week, take 1.5 tabs or 11.25 mg of your warfarin. Then every other day take 1 tab or 7.5mg . Please limit your green intake, such as cabbage as this really affects your level.

## 2019-06-04 NOTE — Assessment & Plan Note (Addendum)
A1c has improved to 12.3, but patient would benefit from increase in SGLT2 therapy to 25mg  daily. Orange card as above. Insulin sensitive while admitted. Encouraged routine follow up and diet management. Sx of yeast infection, given A1c and hx will treat empirically with diflucan x1. INR also subtherapeutic.

## 2019-06-04 NOTE — Assessment & Plan Note (Addendum)
Patient still has not followed up with heart failure. Encouraged follow up. She reports compliance with her medications and is weighing herself daily. Weight is 175lb today in clinic. She appears euvolemic. BMP grossly normal (Na 131 which it has previously been). Patient remains without insurance and is to work with front office for orange card to be able to continue getting her medications. Would benefit from increase in SGLT2 therapy once she can afford medications, but do not want her to run out of the jardiance 10mg .

## 2019-06-17 ENCOUNTER — Telehealth (HOSPITAL_COMMUNITY): Payer: Self-pay | Admitting: Pharmacy Technician

## 2019-06-17 ENCOUNTER — Other Ambulatory Visit: Payer: Self-pay

## 2019-06-17 ENCOUNTER — Encounter (HOSPITAL_COMMUNITY): Payer: Self-pay

## 2019-06-17 ENCOUNTER — Ambulatory Visit (HOSPITAL_COMMUNITY)
Admission: RE | Admit: 2019-06-17 | Discharge: 2019-06-17 | Disposition: A | Discharge status: Home / Self Care | Payer: Self-pay | Source: Ambulatory Visit | Attending: Cardiology | Admitting: Cardiology

## 2019-06-17 ENCOUNTER — Encounter (HOSPITAL_COMMUNITY): Payer: Self-pay | Admitting: *Deleted

## 2019-06-17 VITALS — BP 173/94 | HR 93 | Wt 178.0 lb

## 2019-06-17 DIAGNOSIS — Z7901 Long term (current) use of anticoagulants: Secondary | ICD-10-CM | POA: Insufficient documentation

## 2019-06-17 DIAGNOSIS — Z79899 Other long term (current) drug therapy: Secondary | ICD-10-CM | POA: Insufficient documentation

## 2019-06-17 DIAGNOSIS — I480 Paroxysmal atrial fibrillation: Secondary | ICD-10-CM | POA: Insufficient documentation

## 2019-06-17 DIAGNOSIS — I5043 Acute on chronic combined systolic (congestive) and diastolic (congestive) heart failure: Secondary | ICD-10-CM | POA: Insufficient documentation

## 2019-06-17 DIAGNOSIS — E1110 Type 2 diabetes mellitus with ketoacidosis without coma: Secondary | ICD-10-CM | POA: Insufficient documentation

## 2019-06-17 DIAGNOSIS — I10 Essential (primary) hypertension: Secondary | ICD-10-CM

## 2019-06-17 DIAGNOSIS — J45909 Unspecified asthma, uncomplicated: Secondary | ICD-10-CM | POA: Insufficient documentation

## 2019-06-17 DIAGNOSIS — G4733 Obstructive sleep apnea (adult) (pediatric): Secondary | ICD-10-CM | POA: Insufficient documentation

## 2019-06-17 DIAGNOSIS — I110 Hypertensive heart disease with heart failure: Secondary | ICD-10-CM | POA: Insufficient documentation

## 2019-06-17 DIAGNOSIS — D649 Anemia, unspecified: Secondary | ICD-10-CM | POA: Insufficient documentation

## 2019-06-17 DIAGNOSIS — E1165 Type 2 diabetes mellitus with hyperglycemia: Secondary | ICD-10-CM | POA: Insufficient documentation

## 2019-06-17 DIAGNOSIS — I219 Acute myocardial infarction, unspecified: Secondary | ICD-10-CM | POA: Insufficient documentation

## 2019-06-17 DIAGNOSIS — I5022 Chronic systolic (congestive) heart failure: Secondary | ICD-10-CM

## 2019-06-17 DIAGNOSIS — Z888 Allergy status to other drugs, medicaments and biological substances status: Secondary | ICD-10-CM | POA: Insufficient documentation

## 2019-06-17 DIAGNOSIS — Z833 Family history of diabetes mellitus: Secondary | ICD-10-CM | POA: Insufficient documentation

## 2019-06-17 DIAGNOSIS — I5082 Biventricular heart failure: Secondary | ICD-10-CM

## 2019-06-17 DIAGNOSIS — Z91013 Allergy to seafood: Secondary | ICD-10-CM | POA: Insufficient documentation

## 2019-06-17 DIAGNOSIS — I428 Other cardiomyopathies: Secondary | ICD-10-CM | POA: Insufficient documentation

## 2019-06-17 DIAGNOSIS — I2510 Atherosclerotic heart disease of native coronary artery without angina pectoris: Secondary | ICD-10-CM | POA: Insufficient documentation

## 2019-06-17 DIAGNOSIS — F329 Major depressive disorder, single episode, unspecified: Secondary | ICD-10-CM | POA: Insufficient documentation

## 2019-06-17 DIAGNOSIS — Z8249 Family history of ischemic heart disease and other diseases of the circulatory system: Secondary | ICD-10-CM | POA: Insufficient documentation

## 2019-06-17 LAB — CBC
HCT: 38 % (ref 36.0–46.0)
Hemoglobin: 11.4 g/dL — ABNORMAL LOW (ref 12.0–15.0)
MCH: 23.1 pg — ABNORMAL LOW (ref 26.0–34.0)
MCHC: 30 g/dL (ref 30.0–36.0)
MCV: 77.1 fL — ABNORMAL LOW (ref 80.0–100.0)
Platelets: 347 10*3/uL (ref 150–400)
RBC: 4.93 MIL/uL (ref 3.87–5.11)
RDW: 25.4 % — ABNORMAL HIGH (ref 11.5–15.5)
WBC: 10.8 10*3/uL — ABNORMAL HIGH (ref 4.0–10.5)
nRBC: 0 % (ref 0.0–0.2)

## 2019-06-17 LAB — BASIC METABOLIC PANEL
Anion gap: 9 (ref 5–15)
BUN: 5 mg/dL — ABNORMAL LOW (ref 6–20)
CO2: 23 mmol/L (ref 22–32)
Calcium: 8.8 mg/dL — ABNORMAL LOW (ref 8.9–10.3)
Chloride: 99 mmol/L (ref 98–111)
Creatinine, Ser: 0.74 mg/dL (ref 0.44–1.00)
GFR calc Af Amer: >60 mL/min (ref >60)
GFR calc non Af Amer: >60 mL/min (ref >60)
Glucose, Bld: 516 mg/dL (ref 70–99)
Potassium: 4.2 mmol/L (ref 3.5–5.1)
Sodium: 131 mmol/L — ABNORMAL LOW (ref 135–145)

## 2019-06-17 LAB — DIGOXIN LEVEL: Digoxin Level: <0.2 ng/mL — ABNORMAL LOW (ref 0.8–2.0)

## 2019-06-17 MED ORDER — ENTRESTO 49-51 MG PO TABS: 1.0000 | ORAL_TABLET | Freq: Two times a day (BID) | ORAL | 3 refills | Status: DC

## 2019-06-17 MED ORDER — CARVEDILOL 6.25 MG PO TABS: 12.5000 mg | ORAL_TABLET | Freq: Two times a day (BID) | ORAL | 3 refills | Status: DC

## 2019-06-17 MED ORDER — TORSEMIDE 20 MG PO TABS: ORAL_TABLET | ORAL | 3 refills | Status: DC

## 2019-06-17 MED ORDER — POTASSIUM CHLORIDE CRYS ER 20 MEQ PO TBCR: EXTENDED_RELEASE_TABLET | ORAL | 3 refills | Status: DC

## 2019-06-17 NOTE — Telephone Encounter (Signed)
Patient Advocate Encounter  Received a message regarding patient assistance for Entresto as patient does not have any insurance at this time. I have started an application for [employer] Patient Assistance Program sent in an effort to reduce the patient's out of pocket expense for Entresto to $0.    Called patient and left a message for her to return my call.    Charlann Boxer, CPhT

## 2019-06-17 NOTE — Progress Notes (Signed)
Advanced Heart Failure Clinic Note   Referring Physician: PCP: [name], DO PCP-Cardiologist: Dr. Aundra Dubin  EP- Dr. Curt Bears   HPI:  43 y.o. with history of type 2 diabetes (poorly controlled, last hgbA1c > 15), poorly controlled HTN, hyperlipidemia, paroxysmal atrial fibrillation, and chronic systolic CHF of uncertain etiology.  Patient had an echo in 6/20 as outpatient due to exertional dyspnea.  This showed EF 20-25%, moderate RV dysfunction.  She was also noted to have paroxysmal atrial fibrillation.  She was started on Xarelto.  In 7/20, she was admitted with DKA. In 8/20, she was admitted with CHF exacerbation and diuresed.  She was sent home on Lasix 40 mg po bid.   After getting home, she progressively gained weight ~15 lb.  Her Lasix was increased to 80 mg bid, but this did not help. She developed orthopnea and dyspnea walking a short distance, so was readmitted on 9/2 with CHF exacerbation, in the setting of elevated BP.  HIV was negative in 7/20.  She was in NSR. She was placed on IV diuretics and diuresed well. Volume status significantly improved. LHC showed nonobstructive CAD.  RHC showed R>L heart failure with low PAPI suggesting significant RV dysfunction. She was transitioned to PO torsemide and treated w/ guidelines directed medical therapy w/ Entresto, Spironolactone, Coreg and digoxin. She was also transitioned off of Xarelto and placed on Coumadin. Her INRs have been followed at [facility].   She presents back to clinic today for post hospital f/u. She reports that she has been doing fairly well. She has stable NYHA class II-III symptoms. No symptoms at rest. Denies orthopnea/ PND and LEE. Weight has been stable since discharge. No weight gain based on home scales. Reports full med compliance but BP still remains elevated. She understand risk of getting pregnant given her heart condition and fetotoxic meds. No longer trying to get pregnant. Denies palpitations. Compliant w/ coumadin.  No abnormal bleeding or falls. BP 173/94 today.     Review of Systems: [y] = yes, '[ ]'  = no   General: Weight gain '[ ]' ; Weight loss '[ ]' ; Anorexia '[ ]' ; Fatigue '[ ]' ; Fever '[ ]' ; Chills '[ ]' ; Weakness '[ ]'   Cardiac: Chest pain/pressure '[ ]' ; Resting SOB '[ ]' ; Exertional SOB '[ ]' ; Orthopnea '[ ]' ; Pedal Edema '[ ]' ; Palpitations '[ ]' ; Syncope '[ ]' ; Presyncope '[ ]' ; Paroxysmal nocturnal dyspnea'[ ]'   Pulmonary: Cough '[ ]' ; Wheezing'[ ]' ; Hemoptysis'[ ]' ; Sputum '[ ]' ; Snoring '[ ]'   GI: Vomiting'[ ]' ; Dysphagia'[ ]' ; Melena'[ ]' ; Hematochezia '[ ]' ; Heartburn'[ ]' ; Abdominal pain '[ ]' ; Constipation '[ ]' ; Diarrhea '[ ]' ; BRBPR '[ ]'   GU: Hematuria'[ ]' ; Dysuria '[ ]' ; Nocturia'[ ]'   Vascular: Pain in legs with walking '[ ]' ; Pain in feet with lying flat '[ ]' ; Non-healing sores '[ ]' ; Stroke '[ ]' ; TIA '[ ]' ; Slurred speech '[ ]' ;  Neuro: Headaches'[ ]' ; Vertigo'[ ]' ; Seizures'[ ]' ; Paresthesias'[ ]' ;Blurred vision '[ ]' ; Diplopia '[ ]' ; Vision changes '[ ]'   Ortho/Skin: Arthritis '[ ]' ; Joint pain '[ ]' ; Muscle pain '[ ]' ; Joint swelling '[ ]' ; Back Pain '[ ]' ; Rash '[ ]'   Psych: Depression'[ ]' ; Anxiety'[ ]'   Heme: Bleeding problems '[ ]' ; Clotting disorders '[ ]' ; Anemia '[ ]'   Endocrine: Diabetes '[ ]' ; Thyroid dysfunction'[ ]'    Past Medical History:  Diagnosis Date   Abnormal Pap smear    Acute on chronic combined systolic and diastolic CHF (congestive heart failure) (HCC) 02/19/2019   Acute on chronic congestive heart failure ([place])  Anemia    Asthma    Atrial fibrillation ([place])    Diabetes mellitus    DKA, type 2 ([place]) 02/06/2019   Heart failure with reduced ejection fraction ([place])    Hypertension    Major depressive disorder 02/08/2012   Reports that 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     Migraines    Morbid obesity ([place]) 02/06/2019   Obstructive sleep apnea 06/30/2015   Polycystic ovarian disease     Current Outpatient Medications  Medication Sig Dispense Refill   ACCU-CHEK SOFTCLIX LANCETS  lancets Check fasting blood sugar daily in the morning 100 each 12   Accu-Chek Softclix Lancets lancets Use as instructed 100 each 12   albuterol (VENTOLIN HFA) 108 (90 Base) MCG/ACT inhaler Inhale 2 puffs into the lungs every 6 (six) hours as needed for wheezing or shortness of breath. 18 g 1   atorvastatin (LIPITOR) 40 MG tablet Take 1 tablet (40 mg total) by mouth daily at 6 PM. 30 tablet 3   Blood Glucose Monitoring Suppl (ACCU-CHEK AVIVA PLUS) w/Device KIT 1 application by Does not apply route QID. Check fasting sugars as well as throughout the day 1 kit 0   carvedilol (COREG) 6.25 MG tablet Take 2 tablets (12.5 mg total) by mouth 2 (two) times daily with a meal. 60 tablet 3   digoxin (LANOXIN) 0.125 MG tablet Take 1 tablet (0.125 mg total) by mouth daily. 30 tablet 3   DULoxetine (CYMBALTA) 30 MG capsule Take 1 capsule (30 mg total) by mouth daily. 30 capsule 0   empagliflozin (JARDIANCE) 10 MG TABS tablet Take 10 mg by mouth daily before breakfast. 30 tablet 0   ferrous sulfate 325 (65 FE) MG tablet Take 1 tablet (325 mg total) by mouth every other day. 15 tablet 0   gabapentin (NEURONTIN) 100 MG capsule Take 1 capsule (100 mg total) by mouth every 8 (eight) hours. 90 capsule 0   glucose blood (ACCU-CHEK AVIVA PLUS) test strip Use as instructed 100 each 12   glucose blood (AGAMATRIX PRESTO TEST) test strip Use as instructed 100 each 12   Lancets Misc. (ACCU-CHEK SOFTCLIX LANCET DEV) KIT 1 application by Does not apply route 4 (four) times daily. 1 kit 0   methocarbamol (ROBAXIN) 500 MG tablet Take 1 tablet (500 mg total) by mouth every 8 (eight) hours as needed for muscle spasms. 30 tablet 0   potassium chloride SA (K-DUR) 20 MEQ tablet Take 1 tablet (20 mEq total) by mouth daily. 30 tablet 3   spironolactone (ALDACTONE) 25 MG tablet Take 1 tablet (25 mg total) by mouth daily. 30 tablet 3   torsemide (DEMADEX) 20 MG tablet Take 2 tablets (40 mg total) by mouth daily. 60  tablet 3   traZODone (DESYREL) 50 MG tablet TAKE 1 TABLET BY MOUTH AT BEDTIME AS NEEDED FOR SLEEP 30 tablet 0   warfarin (COUMADIN) 7.5 MG tablet Take 1 tablet (7.5 mg total) by mouth daily at 6 PM. 30 tablet 3   sacubitril-valsartan (ENTRESTO) 49-51 MG Take 1 tablet by mouth 2 (two) times daily. 60 tablet 3   No current facility-administered medications for this encounter.     Allergies  Allergen Reactions   Shellfish Allergy Swelling    Airway involvement including EMS - Has Epi-Pen   Metformin And Related Diarrhea      Social History   Socioeconomic History   Marital status: Single  Spouse name: Not on file   Number of children: Not on file   Years of education: Not on file   Highest education level: Not on file  Occupational History   Not on file  Social Needs   Financial resource strain: Not on file   Food insecurity    Worry: Not on file    Inability: Not on file   Transportation needs    Medical: Not on file    Non-medical: Not on file  Tobacco Use   Smoking status: Never Smoker   Smokeless tobacco: Never Used  Substance and Sexual Activity   Alcohol use: No    Alcohol/week: 0.0 standard drinks   Drug use: No   Sexual activity: Yes  Lifestyle   Physical activity    Days per week: Not on file    Minutes per session: Not on file   Stress: Not on file  Relationships   Social connections    Talks on phone: Not on file    Gets together: Not on file    Attends religious service: Not on file    Active member of club or organization: Not on file    Attends meetings of clubs or organizations: Not on file    Relationship status: Not on file   Intimate partner violence    Fear of current or ex partner: Not on file    Emotionally abused: Not on file    Physically abused: Not on file    Forced sexual activity: Not on file  Other Topics Concern   Not on file  Social History Narrative   Not on file      Family History  Problem  Relation Age of Onset   Diabetes Mother    Hypertension Mother    Hypertension Father    Diabetes Father     Vitals:   06/17/19 0923  BP: (!) 173/94  Pulse: 93  SpO2: 100%  Weight: 80.7 kg (178 lb)     PHYSICAL EXAM: General:  Well appearing young AAF. No respiratory difficulty HEENT: normal Neck: supple. no JVD. Carotids 2+ bilat; no bruits. No lymphadenopathy or thyromegaly appreciated. Cor: PMI nondisplaced. Regular rate & rhythm. No rubs, gallops or murmurs. Lungs: clear Abdomen: soft, nontender, nondistended. No hepatosplenomegaly. No bruits or masses. Good bowel sounds. Extremities: no cyanosis, clubbing, rash, edema Neuro: alert & oriented x 3, cranial nerves grossly intact. moves all 4 extremities w/o difficulty. Affect pleasant.  ECG: not performed today    ASSESSMENT & PLAN:  1. Acute on chronic systolic CHF:  Nonischemic cardiomyopathy, diagnosed in 6/20. Repeat Echo 03/2019 showed EF 20-25%, diffuse hypokinesis, LV thrombus, moderate RV systolic dysfunction, mild MR, moderate TR. She has a history of paroxysmal atrial fibrillation but seems to be mostly in sinus rhythm. This is not likely to be a tachycardia-mediated cardiomyopathy. Poorly controlled diabetes itself can cause a cardiomyopathy, as can poorly controlled HTN. Myocarditis is a potential cause of her cardiomyopathy. [place] 9/20 showed nonobstructive CAD.  RHC showed R>L heart failure with low PAPI suggesting significant RV dysfunction.  -Stable NYHA II-III symptoms, Euvolemic on exam. BP elevated.  - Increase Entresto to 49/51 bid.  - Reduce Torsemide + Kdur to 3 days/ wk, MWF - Continue spironolactone 25 mg daily.  - Increase Coreg to 12.5 mg bid  - Continue digoxin. Check Dig level today - Check BMP today  - She previously had been trying to get pregnant (has been difficult with polycystic ovarian syndrome). She has 2  grown children already. We have discussed this again today. With her  cardiomyopathy and active CHF, pregnancy would create a significant mortality risk for her. A number of her cardiac meds to control her CHF are feto-toxic. She understands this risk and is no longer trying to get pregnant. Knows importance of using contraception to avoid pregnancy.  2. 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  - Compliant w/ coumadin. INRs followed at [facility]. Denies abnormal bleeding. Check CBC today.  3. Atrial fibrillation: Paroxysmal. No recent palpitations. RRR on exam - Continue Digoxin. - Increase Coreg to 12.5 mg bid  - Continue warfarin.  INRs followed by PCP - Would consider atrial fibrillation ablation in the future given [facility]-HF data.  4. Type 2 diabetes: Poor control, per PCP.   - she is on Jardiance  5. Hypertension: BP elevated today.  - Increase Entresto to 49/51 and Coreg to 12.5 mg bid.    F/u w/ Pharm D in 2 weeks and Dr. Aundra Dubin in 6 wks.    Lyda Jester, PA-C 06/17/19

## 2019-06-17 NOTE — Patient Instructions (Signed)
Routine lab work today. Will notify you of abnormal results  INCREASE Entresto to 49/51mg  twice daily.  INCREASE Carvedilol 12.5mg  twice daily.  Reduce Torsemide and Potassium to Monday,Wednesday, and Friday.   Follow up with PharmD clinic in 2 weeks.  Follow up with Dr.McLean in 6 weeks.

## 2019-07-09 ENCOUNTER — Inpatient Hospital Stay (HOSPITAL_COMMUNITY): Admission: RE | Admit: 2019-07-09 | Payer: Medicaid Other | Source: Ambulatory Visit

## 2019-07-10 ENCOUNTER — Telehealth (HOSPITAL_COMMUNITY): Payer: Self-pay | Admitting: Pharmacist

## 2019-07-10 NOTE — Telephone Encounter (Signed)
Patient missed her Pharmacy Clinic appointment on 07/09/19. Unable to reach patient to reschedule visit. Have also been unable to reach patient to have her sign application for [employer] patient assistance.   [name], PharmD, BCPS, BCCP, CPP Heart Failure Clinic Pharmacist [phone]

## 2019-07-11 ENCOUNTER — Telehealth: Payer: Self-pay | Admitting: *Deleted

## 2019-07-11 ENCOUNTER — Other Ambulatory Visit: Payer: Self-pay | Admitting: *Deleted

## 2019-07-11 DIAGNOSIS — G8929 Other chronic pain: Secondary | ICD-10-CM

## 2019-07-11 DIAGNOSIS — F324 Major depressive disorder, single episode, in partial remission: Secondary | ICD-10-CM

## 2019-07-11 NOTE — Telephone Encounter (Signed)
Pt states that she discussed increasing cymbalta with the provider, she is ready to do so now. Christen Bame, CMA

## 2019-07-13 MED ORDER — METHOCARBAMOL 500 MG PO TABS: 500.0000 mg | ORAL_TABLET | Freq: Three times a day (TID) | ORAL | 0 refills | Status: DC | PRN

## 2019-07-13 MED ORDER — DULOXETINE HCL 40 MG PO CPEP: 30.0000 mg | ORAL_CAPSULE | Freq: Every day | ORAL | 1 refills | Status: DC

## 2019-07-13 MED ORDER — TRAZODONE HCL 50 MG PO TABS: 50.0000 mg | ORAL_TABLET | Freq: Every evening | ORAL | 0 refills | Status: DC | PRN

## 2019-07-13 NOTE — Telephone Encounter (Signed)
Received message that patient wanted to increase Cymbalta, we did discuss this in the past.  She has been on 30 for some time and I can increase to 40 with this refill Dr. Criss Rosales

## 2019-07-15 MED ORDER — DULOXETINE HCL 40 MG PO CPEP: 40.0000 mg | ORAL_CAPSULE | Freq: Every day | ORAL | 0 refills | Status: DC

## 2019-07-15 NOTE — Telephone Encounter (Signed)
40 mg daily of duloxetine, patient signature is corrected now and sent to pharmacy Dr. Criss Rosales

## 2019-07-15 NOTE — Telephone Encounter (Signed)
Pharmacy asking for clarification on Duloxetine 40 mg. Rx states take 30 Mg daily.  SIG; How many capsules. Ottis Stain, CMA

## 2019-07-15 NOTE — Addendum Note (Signed)
Addended by: [name] R on: 07/15/2019 04:40 PM   Modules accepted: Orders

## 2019-07-30 ENCOUNTER — Encounter (HOSPITAL_COMMUNITY): Payer: Medicaid Other | Admitting: Cardiology

## 2019-07-31 ENCOUNTER — Encounter: Payer: Self-pay | Admitting: Family Medicine

## 2019-07-31 ENCOUNTER — Ambulatory Visit (INDEPENDENT_AMBULATORY_CARE_PROVIDER_SITE_OTHER): Payer: Self-pay | Admitting: Family Medicine

## 2019-07-31 ENCOUNTER — Other Ambulatory Visit: Payer: Self-pay

## 2019-07-31 VITALS — BP 164/118 | HR 92 | Wt 179.8 lb

## 2019-07-31 DIAGNOSIS — E119 Type 2 diabetes mellitus without complications: Secondary | ICD-10-CM

## 2019-07-31 DIAGNOSIS — I480 Paroxysmal atrial fibrillation: Secondary | ICD-10-CM

## 2019-07-31 LAB — GLUCOSE, POCT (MANUAL RESULT ENTRY): POC Glucose: 244 mg/dl — AB (ref 70–99)

## 2019-07-31 MED ORDER — EMPAGLIFLOZIN 25 MG PO TABS: 25.0000 mg | ORAL_TABLET | Freq: Every day | ORAL | 3 refills | Status: DC

## 2019-07-31 NOTE — Patient Instructions (Signed)
Today we talked about your need to get your diabetes under control for your driver's license.  I think that you can get the A1c down to 10 but this is going to take a lot of work.  I think the obvious step for your diabetes diet control right now is to switch to diet soda instead of drinking regular cheer wine.  It might be helpful if you got rid of all of the regular cheer wine in the house.   I also think you should set an alarm on your phone to help you remember to take your medications over the weekend.  We are going to give you a card to try and speak to a nutritionist and would like you to schedule a weekly phone check-in to discuss your progress.  This will be hard but I think you can get there.

## 2019-08-02 NOTE — Assessment & Plan Note (Signed)
Noted that patient is INR was low at her last visit, can focus on diabetes discussed this when she was here in the office January 6.  Called and left a voice message asking her to get her INR checked early next week

## 2019-08-02 NOTE — Assessment & Plan Note (Addendum)
With goal of being able to obtain patient's work requires status of fasting glucose under 240 and A1c under 10.0 we are going to increase Jardiance to 25 mg from 10, we also advised stricter diet control have given her a referral to nutrition.  Suggested that she check in with [place] weekly for the next week for a phone visit as well as schedule a personal visit a couple days for you so we can review the labs.  She was advised that she would likely need a cardiology clearance given her significant heart history she wants to drive a bus.

## 2019-08-02 NOTE — Progress Notes (Signed)
    Subjective:  [name] is a 44 y.o. female who presents to the [facility] today with a chief complaint of wantnig work note.   HPI: Patient wanted a work note for her ability to drive.  The criteria listed on the work note include fasting glucose under 240 and an A1c under 10.0.  He also requires a attestation by her physician that her diabetes is well controlled.  She is concerned because she says she is 30 days, we discussed that her fasting glucose was 240s and not under the line.  Her A1c was 11.3 which is down from her last few months ago which was over 12.  We discussed that she would need to make significant diet changes in order to accomplish this and keep her job.  She says it has been hard because she has been stressed out dealing with his pain is only been taking her medication 5 days/week, she has also been drinking soda on a regular basis.  Objective:  Physical Exam: BP (!) 164/118   Pulse 92   Wt 179 lb 12.8 oz (81.6 kg)   LMP 07/26/2019 (Exact Date)   SpO2 100%   BMI 38.91 kg/m   Gen: NAD, conversing comfortably CV: RRR with 2/6 murmur appreciated Pulm: NWOB, CTAB with no crackles, wheezes, or rhonchi MSK: no edema, cyanosis, or clubbing noted Skin: warm, dry Neuro: grossly normal, moves all extremities Psych: Normal affect and thought content  Results for orders placed or performed in visit on 07/31/19 (from the past 72 hour(s))  Glucose (CBG)     Status: Abnormal   Collection Time: 07/31/19  9:32 AM  Result Value Ref Range   POC Glucose 244 (A) 70 - 99 mg/dl     Assessment/Plan:  DM2 (diabetes mellitus, type 2) ([place]) With goal of being able to obtain patient's work requires status of fasting glucose under 240 and A1c under 10.0 we are going to increase Jardiance to 25 mg from 10, we also advised stricter diet control have given her a referral to nutrition.  Suggested that she check in with [place] weekly for the next week for a phone visit as well as schedule a  personal visit a couple days for you so we can review the labs.  She was advised that she would likely need a cardiology clearance given her significant heart history she wants to drive a bus.  Paroxysmal atrial fibrillation ([place]) Noted that patient is INR was low at her last visit, can focus on diabetes discussed this when she was here in the office January 6.  Called and left a voice message asking her to get her INR checked early next week   [name], [place] - PGY3 08/02/2019 10:11 AM

## 2019-08-09 ENCOUNTER — Telehealth: Payer: Self-pay

## 2019-08-09 DIAGNOSIS — B373 Candidiasis of vulva and vagina: Secondary | ICD-10-CM

## 2019-08-09 DIAGNOSIS — B3731 Acute candidiasis of vulva and vagina: Secondary | ICD-10-CM

## 2019-08-09 MED ORDER — MICONAZOLE 7 100 MG VA SUPP: 100.0000 mg | Freq: Every day | VAGINAL | 0 refills | Status: DC

## 2019-08-09 MED ORDER — TERCONAZOLE 0.8 % VA CREA: 1.0000 | TOPICAL_CREAM | Freq: Every day | VAGINAL | 0 refills | Status: DC

## 2019-08-09 MED FILL — TERCONAZOLE 0.8% VAGINAL CR: 0.8 | 3 days supply | Qty: 20 | Fill #0

## 2019-08-09 NOTE — Telephone Encounter (Signed)
Patient was recently started on Jardiance, called complaining of symptoms of yeast infection which is reasonable given this new medication.  Ordered miconazole due to increased risk of a systemic (diflucan) med interfering with warfarin and sent to the [facility] outpatient pharmacy  Dr. Criss Rosales

## 2019-08-09 NOTE — Telephone Encounter (Signed)
Pt calls nurse line requesting rx for yeast infection. Pt states that there have been changes to her medications and that she was told she may develop yeast infection from these changes.  Offered patient appointment for this afternoon. Pt declined and states that she does not want to have to come into the office.   Please advise  To PCP  Talbot Grumbling, RN

## 2019-08-09 NOTE — Telephone Encounter (Signed)
Patient calls nurse line stating [facility] outpatient pharmacy does not have miconazole. I called pharmacist and gave warfarin history and per what they have in stock the patient can either do a 3 day course or 7 day course of Tetraconazole. Please advise and I will send in new prescription.

## 2019-08-09 NOTE — Addendum Note (Signed)
Addended by: Dorna Bloom on: 08/09/2019 03:55 PM   Modules accepted: Orders

## 2019-08-14 ENCOUNTER — Telehealth (INDEPENDENT_AMBULATORY_CARE_PROVIDER_SITE_OTHER): Payer: Self-pay | Admitting: Family Medicine

## 2019-08-14 ENCOUNTER — Other Ambulatory Visit: Payer: Self-pay

## 2019-08-14 DIAGNOSIS — E119 Type 2 diabetes mellitus without complications: Secondary | ICD-10-CM

## 2019-08-14 NOTE — Progress Notes (Signed)
Virtual Visit via Video Note  I connected with [name] on 08/14/19 at  1:30 PM EST by a video enabled telemedicine application and verified that I am speaking with the correct person using two identifiers.  Location: Patient: Parked in her vehicle Provider: [facility] clinic   I discussed the limitations of evaluation and management by telemedicine and the availability of in person appointments. The patient expressed understanding and agreed to proceed.  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  She does not want to use injections, she has had issues with metformin and finding medication is complicated by significant cardiac problems as well as taking warfarin.  She says that she has been trying to reduce her amount of soda intake and has replaced some of it with Powerade although she is using regular Powerade and not the Powerade 0.  She also drinks significant amounts of fruit punch.  She has not been checking her blood sugar so were not sure what her progress has been, last A1c was 11.2   Observations/Objective: Pleasant and hopeful about getting under goal, less than optimistic about her ability to make the diet changes necessary.  But does not appear in distress  Assessment and Plan: I reset pharmacy to see if they have any other ideas for medications that might be possible given this complicated patient, I have expressed to her that without making significant diet changes or being willing to consider injections that we may not be able to get her to goal required to keep her job  Follow Up Instructions:    I discussed the assessment and treatment plan with the patient. The patient was provided an opportunity to ask questions and all were answered. The patient agreed with the plan and demonstrated an understanding of the instructions.    The patient was advised to call back or seek an in-person evaluation if the symptoms worsen or if the condition fails to improve as anticipated.  I provided 14 minutes of non-face-to-face time during this encounter.   [name], DO

## 2019-08-16 ENCOUNTER — Telehealth: Payer: Self-pay | Admitting: Family Medicine

## 2019-08-16 DIAGNOSIS — E119 Type 2 diabetes mellitus without complications: Secondary | ICD-10-CM

## 2019-08-16 MED ORDER — ACCU-CHEK AVIVA PLUS VI STRP: ORAL_STRIP | 12 refills | Status: DC

## 2019-08-16 MED ORDER — ACCU-CHEK SOFTCLIX LANCETS MISC: 12 refills | Status: DC

## 2019-08-16 MED ORDER — ACCU-CHEK SOFTCLIX LANCET DEV KIT: 1.0000 "application " | PACK | Freq: Two times a day (BID) | 0 refills | Status: DC

## 2019-08-16 NOTE — Telephone Encounter (Signed)
Called patient after discussion with pharmacist about starting glimeperide and restarting to check her cbgs as well.  Patient agrees as she wants to get DM in control enough for driver license, will start 1mg  daily and reorder monitoring equipment, if cbgs <100 will stop taking glimeperide and call physician.  -Dr. Criss Rosales

## 2019-08-21 ENCOUNTER — Telehealth: Payer: Self-pay

## 2019-08-21 NOTE — Telephone Encounter (Signed)
LVM asking for a return call about the appt that is scheduled for tomorrow. We need to ask some questions and give information about the weather tomorrow. Ottis Stain, CMA

## 2019-08-22 ENCOUNTER — Telehealth: Payer: Self-pay | Admitting: *Deleted

## 2019-08-22 ENCOUNTER — Other Ambulatory Visit: Payer: Self-pay

## 2019-08-22 ENCOUNTER — Encounter: Payer: Self-pay | Admitting: Family Medicine

## 2019-08-22 ENCOUNTER — Ambulatory Visit (INDEPENDENT_AMBULATORY_CARE_PROVIDER_SITE_OTHER): Payer: Self-pay | Admitting: Family Medicine

## 2019-08-22 VITALS — BP 136/100 | HR 101 | Ht 59.0 in | Wt 186.5 lb

## 2019-08-22 DIAGNOSIS — G8929 Other chronic pain: Secondary | ICD-10-CM

## 2019-08-22 DIAGNOSIS — E119 Type 2 diabetes mellitus without complications: Secondary | ICD-10-CM

## 2019-08-22 DIAGNOSIS — F330 Major depressive disorder, recurrent, mild: Secondary | ICD-10-CM

## 2019-08-22 LAB — POCT GLYCOSYLATED HEMOGLOBIN (HGB A1C): HbA1c, POC (controlled diabetic range): 11.5 % — AB (ref 0.0–7.0)

## 2019-08-22 MED ORDER — TRULICITY 0.75 MG/0.5ML ~~LOC~~ SOAJ: 0.7500 mg | SUBCUTANEOUS | 0 refills | Status: DC

## 2019-08-22 MED ORDER — TRAZODONE HCL 50 MG PO TABS: 50.0000 mg | ORAL_TABLET | Freq: Every evening | ORAL | 0 refills | Status: DC | PRN

## 2019-08-22 NOTE — Progress Notes (Signed)
Subjective:   Patient ID: [name]    DOB: 1976/06/05, 44 y.o. female   MRN: [id number]  [name] is a 44 y.o. female with a history of HTN, HFrEF, PAF, OSA, T2DM, PCOS, HLD, hydrosalpinx, dysmenorrhea, MDD, anemia, morbid obesity here for   Diabetes, Type 2 - Last A1c 12.3 05/2019 - Medications: Jardiance 25 mg - Compliance: good - Checking BG at home: no - Eye exam: done 02/2019, doesn't remember name of office - Foot exam: due - Microalbumin: UTD - Statin: yes - Pneumococcal vaccine: due - Denies symptoms of hypoglycemia, polyuria, polydipsia, numbness extremities, foot ulcers/trauma  Review of Systems:  Per HPI.  Medications and smoking status reviewed.  Objective:   BP (!) 136/100   Pulse (!) 101   Ht 4\' 11"  (1.499 m)   Wt 186 lb 8 oz (84.6 kg)   LMP 08/22/2019   SpO2 99%   BMI 37.67 kg/m  Vitals and nursing note reviewed.  General: obese female, in no acute distress with non-toxic appearance CV: regular rate and rhythm without murmurs, rubs, or gallops Lungs: clear to auscultation bilaterally with normal work of breathing Skin: warm, dry, no rashes or lesions Extremities: warm and well perfused, normal tone. Foot exam wnl. Psych: tearful when discussing inadequate diabetic control  Depression screen [facility] 2/9 08/22/2019 08/22/2019 07/31/2019  Decreased Interest 2 2 2   Down, Depressed, Hopeless 2 2 2   PHQ - 2 Score 4 4 4   Altered sleeping 2 2 -  Tired, decreased energy 2 2 -  Change in appetite 0 0 -  Feeling bad or failure about yourself  2 2 -  Trouble concentrating 0 0 -  Moving slowly or fidgety/restless 0 0 -  Suicidal thoughts 0 0 -  PHQ-9 Score 10 10 -     Assessment & Plan:   DM2 (diabetes mellitus, type 2) ([facility]) Remains uncontrolled but demonstrating better control with increase of jardiance dosing at last visit. A1c 11.5 today. Patient hesitant to start injectable medication but amenable to once weekly trulicity. She may eventually  require insulin however. Pharmacy team educated on proper administration of trulicity (see separate note). Referral placed to CCM for medication financial assistance needs for jardiance and trulicity given lack of insurance. 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 Foot exam wnl. Follow up in one month.  Major depressive disorder Very tearful on exam today. Stressed about new diabetic diagnosis, financial strain, and threat of losing her job due to poor diabetic control. PHQ 10 today. Discussed counseling however patient declines. Currently on duloxetine. Recommend f/u with PCP for possible medication titration if symptoms  do not improve with better diabetic control and financial assistance.  Orders Placed This Encounter  Procedures  . HgB A1c   Meds ordered this encounter  Medications  . traZODone (DESYREL) 50 MG tablet    Sig: Take 1 tablet (50 mg total) by mouth at bedtime as needed. for sleep    Dispense:  30 tablet    Refill:  0  . Dulaglutide (TRULICITY) A999333 [id number] SOPN    Sig: Inject 0.75 mg into the skin once a week.    Dispense:  1 pen    Refill:  0  . Dulaglutide (TRULICITY) A999333 [id number] SOPN    Sig: Inject 0.75 mg into the skin once a week.    Dispense:  2 pen    Refill:  0    Order Specific Question:   Lot Number?    Answer:  [id number]    Order Specific Question:   Expiration Date?    Answer:   12/22/2020    Rory Percy, DO PGY-3, [facility] 08/22/2019 3:55 PM

## 2019-08-22 NOTE — Progress Notes (Signed)
Patient was counseled on the mechanism of action, efficacy, dosing, administration, and side effects of dulaglutide (Trulicity) A999333 mg subQ weekly. Demonstrated proper injection technique using demo pen and teach back method. Patient was given sample in office today. Patient was offered to administer first injection at office visit today. Patient politely declined considering she feels more comfortable giving injection at home with her daughter. Instructed patient how to view Trulicity administration instruction video and saved video to her phone. Advised patient if she has any further questions regarding Trulicity to reach out to pharmacy team at [facility]. Patient verbalized understanding.  Thank you for involving pharmacy to assist in providing this patient's care.   Drexel Iha, PharmD PGY2 Ambulatory Care Pharmacy Resident

## 2019-08-22 NOTE — Patient Instructions (Signed)
It was great to see you!  Our plans for today:  - We are starting a new medication called trulicity. We provided you a sample today.  - I sent a message to our pharmacy team to help you get your medications cheaper. They will be giving you a call.  - You will need to follow up in 1 month after starting the medication to see how you are doing.   Take care and seek immediate care sooner if you develop any concerns.   Dr. [facility]

## 2019-08-22 NOTE — Telephone Encounter (Signed)
Patient does not have insurance. Gave trulicity sample today and will get patient set up with PAP program through pharmacy. RN team notified.

## 2019-08-22 NOTE — Telephone Encounter (Signed)
PA needed on Trulicity.  I have placed the form and formulary in Dr. Zella Ball box. Christen Bame, CMA

## 2019-08-22 NOTE — Assessment & Plan Note (Signed)
Remains uncontrolled but demonstrating better control with increase of jardiance dosing at last visit. A1c 11.5 today. Patient hesitant to start injectable medication but amenable to once weekly trulicity. She may eventually require insulin however. Pharmacy team educated on proper administration of trulicity (see separate note). Referral placed to CCM for medication financial assistance needs for jardiance and trulicity given lack of insurance. 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 Foot exam wnl. Follow up in one month.

## 2019-08-22 NOTE — Assessment & Plan Note (Signed)
Very tearful on exam today. Stressed about new diabetic diagnosis, financial strain, and threat of losing her job due to poor diabetic control. PHQ 10 today. Discussed counseling however patient declines. Currently on duloxetine. Recommend f/u with PCP for possible medication titration if symptoms  do not improve with better diabetic control and financial assistance.

## 2019-09-04 ENCOUNTER — Telehealth: Payer: Self-pay

## 2019-09-04 ENCOUNTER — Other Ambulatory Visit: Payer: Self-pay

## 2019-09-04 DIAGNOSIS — E119 Type 2 diabetes mellitus without complications: Secondary | ICD-10-CM

## 2019-09-04 MED ORDER — ALBUTEROL SULFATE HFA 108 (90 BASE) MCG/ACT IN AERS: 2.0000 | INHALATION_SPRAY | Freq: Four times a day (QID) | RESPIRATORY_TRACT | 1 refills | Status: DC | PRN

## 2019-09-04 NOTE — Telephone Encounter (Signed)
Patient calls nurse line regarding side effects of trulicity. Patient states that she has been taking medication for two weeks and noticed symptom onset one week after initial injection. Patient states she has been experiencing nausea, vomiting and diarrhea.   Patient also reports that she is out of medication and needs financial assistance. Next dose is due on Friday 09/06/19.   Please advise on next steps for further management.   Talbot Grumbling, RN

## 2019-09-05 ENCOUNTER — Telehealth: Payer: Self-pay

## 2019-09-05 ENCOUNTER — Other Ambulatory Visit: Payer: Self-pay

## 2019-09-05 ENCOUNTER — Telehealth (INDEPENDENT_AMBULATORY_CARE_PROVIDER_SITE_OTHER): Payer: Self-pay | Admitting: Pharmacist

## 2019-09-05 DIAGNOSIS — E119 Type 2 diabetes mellitus without complications: Secondary | ICD-10-CM

## 2019-09-05 NOTE — Telephone Encounter (Signed)
Virtual appointment made with Dr. Valentina Lucks this afternoon to discuss medication management.   Talbot Grumbling, RN

## 2019-09-05 NOTE — Patient Instructions (Signed)
No additional dose of Trulicity due to GI symptoms.   Check blood sugar.   Plan to talk again tomorrow AM

## 2019-09-05 NOTE — Telephone Encounter (Signed)
Attempted to call pt to schedule appointment.. Left voice message for patient to return call.   Talbot Grumbling, RN

## 2019-09-05 NOTE — Progress Notes (Signed)
Noted and agree. 

## 2019-09-05 NOTE — Progress Notes (Signed)
Phone call to patient RE GLP intolerance with Trulicity.   Patient reported nausea with vomiting X3 since taking second dose of Trulicity.  She reports taking her Jardiance 25mg .  Patient has taken insulin in the past with some success.   She did not have any blood glucose readings recently. She reports nocturia X4 per night.   She verbalized frustration with numerous medication changes due to her new diagnoses over the last 8 months.  She stated that she understood need for medication adjustment and trials of new therapy.   I asked her to completely stop the Trulicity, check a blood sugar this evening and another in the morning.  I would like to see complete resolution of GI intolerance prior to initiating a trial of an alternative GLP.   I plan to call her in the AM 2/12 to discuss options including use of short-term insulin if blood sugars are extremely elevated.   Total time in evaluation and management 11 minutes.

## 2019-09-06 NOTE — Assessment & Plan Note (Signed)
Phone call to patient RE blood glucose control and Trulicity intolerance.   Patient reports GI symptoms continue and she states she had vomiting again yesterday evening after we talked on the phone.  She did not check blood sugar at night or this AM.    She states she was up multiple times during the night with symptoms and was unsure if her blood sugar was the cause.  I asked her to check her blood sugar.  She was unable to do this during the call b/c she was at [facility] preparing for a trip to the beach.   We discussed a plan to restart a small dose of insulin (she has Lantus supply).  I advised her to check her sugar and consider taking 10 units (previous dose was 25 per patient report) if her blood sugar check revealed a reading > 200.   We discussed not starting any new treatments for her until her GI side effects are improved.   She verbalized understanding of re-initiation of insulin plan.     We agreed that I would follow-up by phone in 4 days (Tuesday) when she has returned from her beach trip.  Reevaluate options including insulin re-initiation/adjsutment at that time.

## 2019-09-06 NOTE — Assessment & Plan Note (Signed)
Intolerance to Trulicity noted  Asked to stop completely.  Reevaluate status 2/12 and consider use of insulin short-term.   She was asked to check CBGs x2

## 2019-09-06 NOTE — Telephone Encounter (Signed)
Noted and agree. 

## 2019-09-06 NOTE — Telephone Encounter (Signed)
Phone call to patient RE blood glucose control and Trulicity intolerance.   Patient reports GI symptoms continue and she states she had vomiting again yesterday evening after we talked on the phone.  She did not check blood sugar at night or this AM.    She states she was up multiple times during the night with symptoms and was unsure if her blood sugar was the cause.  I asked her to check her blood sugar.  She was unable to do this during the call b/c she was at [facility] preparing for a trip to the beach.   We discussed a plan to restart a small dose of insulin (she has Lantus supply).  I advised her to check her sugar and consider taking 10 units (previous dose was 25 per patient report) if her blood sugar check revealed a reading > 200.   We discussed not starting any new treatments for her until her GI side effects are improved.   She verbalized understanding of re-initiation of insulin plan.     We agreed that I would follow-up by phone in 4 days (Tuesday) when she has returned from her beach trip.  Reevaluate options including insulin re-initiation/adjsutment at that time.

## 2019-09-10 ENCOUNTER — Telehealth: Payer: Self-pay | Admitting: Pharmacist

## 2019-09-10 NOTE — Telephone Encounter (Signed)
Patient returned phone message call from earlier in the day.    She reports less nausea and denies vomiting.  She states that she is going to pick up a pregnancy test today.   I shared that the resolution of the Nausea and Vomiting is likely related to the Trulicity.   She has not checked any blood sugars since we talked 5 days ago.  She reports nocturia at 1-2 times per night.   I asked her to check her blood sugar multiple times over the next three day.  I will plan to call her and reassess blood glucose control in 3 days.

## 2019-09-11 ENCOUNTER — Emergency Department (HOSPITAL_COMMUNITY)
Admission: EM | Admit: 2019-09-11 | Discharge: 2019-09-12 | Disposition: A | Discharge status: Home / Self Care | Payer: Medicaid Other | Attending: Emergency Medicine | Admitting: Emergency Medicine

## 2019-09-11 ENCOUNTER — Other Ambulatory Visit: Payer: Self-pay

## 2019-09-11 DIAGNOSIS — Z79899 Other long term (current) drug therapy: Secondary | ICD-10-CM | POA: Diagnosis not present

## 2019-09-11 DIAGNOSIS — Z7984 Long term (current) use of oral hypoglycemic drugs: Secondary | ICD-10-CM | POA: Diagnosis not present

## 2019-09-11 DIAGNOSIS — I509 Heart failure, unspecified: Secondary | ICD-10-CM | POA: Diagnosis not present

## 2019-09-11 DIAGNOSIS — E119 Type 2 diabetes mellitus without complications: Secondary | ICD-10-CM | POA: Diagnosis not present

## 2019-09-11 DIAGNOSIS — I110 Hypertensive heart disease with heart failure: Secondary | ICD-10-CM | POA: Diagnosis not present

## 2019-09-11 DIAGNOSIS — R0789 Other chest pain: Secondary | ICD-10-CM | POA: Diagnosis present

## 2019-09-11 DIAGNOSIS — R079 Chest pain, unspecified: Secondary | ICD-10-CM

## 2019-09-11 NOTE — Telephone Encounter (Signed)
Opened in error.   Jamarr Treinen C Ashraf Mesta, RN  

## 2019-09-12 ENCOUNTER — Encounter (HOSPITAL_COMMUNITY): Payer: Self-pay | Admitting: *Deleted

## 2019-09-12 ENCOUNTER — Emergency Department (HOSPITAL_COMMUNITY): Payer: Medicaid Other

## 2019-09-12 ENCOUNTER — Other Ambulatory Visit: Payer: Self-pay

## 2019-09-12 LAB — COMPREHENSIVE METABOLIC PANEL
ALT: 25 U/L (ref 0–44)
AST: 42 U/L — ABNORMAL HIGH (ref 15–41)
Albumin: 2.9 g/dL — ABNORMAL LOW (ref 3.5–5.0)
Alkaline Phosphatase: 121 U/L (ref 38–126)
Anion gap: 13 (ref 5–15)
BUN: 14 mg/dL (ref 6–20)
CO2: 21 mmol/L — ABNORMAL LOW (ref 22–32)
Calcium: 8.9 mg/dL (ref 8.9–10.3)
Chloride: 102 mmol/L (ref 98–111)
Creatinine, Ser: 0.78 mg/dL (ref 0.44–1.00)
GFR calc Af Amer: >60 mL/min (ref >60)
GFR calc non Af Amer: >60 mL/min (ref >60)
Glucose, Bld: 236 mg/dL — ABNORMAL HIGH (ref 70–99)
Potassium: 3.8 mmol/L (ref 3.5–5.1)
Sodium: 136 mmol/L (ref 135–145)
Total Bilirubin: 1.1 mg/dL (ref 0.3–1.2)
Total Protein: 6.8 g/dL (ref 6.5–8.1)

## 2019-09-12 LAB — CBC WITH DIFFERENTIAL/PLATELET
Abs Immature Granulocytes: 0.05 10*3/uL (ref 0.00–0.07)
Basophils Absolute: 0.1 10*3/uL (ref 0.0–0.1)
Basophils Relative: 0 %
Eosinophils Absolute: 0.1 10*3/uL (ref 0.0–0.5)
Eosinophils Relative: 1 %
HCT: 35.9 % — ABNORMAL LOW (ref 36.0–46.0)
Hemoglobin: 11.3 g/dL — ABNORMAL LOW (ref 12.0–15.0)
Immature Granulocytes: 0 %
Lymphocytes Relative: 21 %
Lymphs Abs: 2.6 10*3/uL (ref 0.7–4.0)
MCH: 25.5 pg — ABNORMAL LOW (ref 26.0–34.0)
MCHC: 31.5 g/dL (ref 30.0–36.0)
MCV: 80.9 fL (ref 80.0–100.0)
Monocytes Absolute: 0.7 10*3/uL (ref 0.1–1.0)
Monocytes Relative: 5 %
Neutro Abs: 8.8 10*3/uL — ABNORMAL HIGH (ref 1.7–7.7)
Neutrophils Relative %: 73 %
Platelets: 286 10*3/uL (ref 150–400)
RBC: 4.44 MIL/uL (ref 3.87–5.11)
RDW: 13.5 % (ref 11.5–15.5)
WBC: 12.2 10*3/uL — ABNORMAL HIGH (ref 4.0–10.5)
nRBC: 0 % (ref 0.0–0.2)

## 2019-09-12 LAB — BRAIN NATRIURETIC PEPTIDE: B Natriuretic Peptide: 649.9 pg/mL — ABNORMAL HIGH (ref 0.0–100.0)

## 2019-09-12 LAB — DIGOXIN LEVEL: Digoxin Level: <0.2 ng/mL — ABNORMAL LOW (ref 0.8–2.0)

## 2019-09-12 LAB — TROPONIN I (HIGH SENSITIVITY)
Troponin I (High Sensitivity): 20 ng/L — ABNORMAL HIGH (ref <18)
Troponin I (High Sensitivity): 25 ng/L — ABNORMAL HIGH (ref <18)

## 2019-09-12 LAB — PROTIME-INR
INR: 1.1 (ref 0.8–1.2)
Prothrombin Time: 13.8 seconds (ref 11.4–15.2)

## 2019-09-12 MED ORDER — FUROSEMIDE 10 MG/ML IJ SOLN
80.0000 mg | Freq: Once | INTRAMUSCULAR | Status: AC
  Administered 2019-09-12: 03:00:00 80 mg via INTRAVENOUS
  Filled 2019-09-12: qty 8

## 2019-09-12 NOTE — Discharge Instructions (Addendum)
Take your torsemide daily until you follow up with your cardiologist.

## 2019-09-12 NOTE — ED Notes (Signed)
Pure wick placed  Lasix given

## 2019-09-12 NOTE — ED Notes (Signed)
Pt alert no distress 

## 2019-09-12 NOTE — ED Provider Notes (Signed)
Emergency Department Provider Note   I have reviewed the triage vital signs and the nursing notes.   HISTORY  Chief Complaint Chest Pain   HPI [name] is a 44 y.o. female with a history of heart failure who presents to the emergency department today secondary to shortness of breath and chest pain.  Patient states she has had multiple heart failure exacerbations in the past.  On review of the records it appears as her most week echocardiogram was in September where she had a ejection fraction of 20 to 25%.  She does not have a defibrillator at this time.  She states she is had an echo since then but cannot find it in the system and she does not know the results.  She is also unsure of her cardiologist however states it is with [facility] and on review of the record she is seeing Dr. Curt Bears as an outpatient and Dr. Aundra Dubin did a catheterization in September. She states her current illness started approximately 3 to 4 days ago with progressively worsening dyspnea on exertion.  This culminated into difficulty walking up the steps on Tuesday evening.  She states has been compliant to medications but she forego her dose on Tuesday evening because of her difficulty walking up the steps.   She had difficulty sleeping that night as she kept having PND.  Throughout the day today her chest pain got worse and her breathing got worse even at rest so she presented here for evaluation.  She states she did take her medications today.  Is unclear on what her medications are but states that she is compliant generally.  No fever, cough, body aches or other associated symptoms.  She states that her legs are too swollen today but she thinks that her fluid is in her abdomen.  She is gained 10 to 15 pounds in the last month.  No other associated or modifying symptoms.    Past Medical History:  Diagnosis Date  . Abnormal Pap smear   . Acute on chronic combined systolic and diastolic CHF (congestive heart  failure) ([place]) 02/19/2019  . Acute on chronic congestive heart failure ([place])   . Anemia   . Asthma   . Atrial fibrillation ([address])   . Diabetes mellitus   . DKA, type 2 ([place]) 02/06/2019  . Heart failure with reduced ejection fraction ([place])   . Hypertension   . 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    . Migraines   . Morbid obesity ([place]) 02/06/2019  . Obstructive sleep apnea 06/30/2015  . Polycystic ovarian disease     Patient Active Problem List   Diagnosis Date Noted  . Attempting to conceive 03/10/2019  . Asthma   . Yeast infection 02/06/2019  . Right ventricular failure ([place]) 02/06/2019  . Morbid obesity ([place]) 02/06/2019  . HFrEF (heart failure with reduced ejection fraction) ([place]) 12/31/2018  . Paroxysmal atrial fibrillation ([place]) 12/31/2018  . Anxiety 12/31/2018  . Hyperlipidemia associated with type 2 diabetes mellitus ([place]) 10/30/2017  . Obstructive sleep apnea 06/30/2015  . Anemia due to blood loss, chronic 06/27/2012  . Allergic rhinitis 06/27/2012  . Major depressive disorder 02/08/2012  . Hypertension associated with diabetes ([place]) 12/04/2011  . Dysmenorrhea 11/22/2011  . DM2 (diabetes mellitus, type 2) ([place]) 05/19/2011  . PCOS (polycystic ovarian syndrome) 05/19/2011  . Hydrosalpinx 08/21/2009    Past Surgical  History:  Procedure Laterality Date  . CERVIX LESION DESTRUCTION    . CESAREAN SECTION    . RIGHT/LEFT HEART CATH AND CORONARY ANGIOGRAPHY N/A 03/29/2019   Procedure: RIGHT/LEFT HEART CATH AND CORONARY ANGIOGRAPHY;  Surgeon: Larey Dresser, MD;  Location: [facility];  Service: Cardiovascular;  Laterality: N/A;    Current Outpatient Rx  . Order #: [id number] Class: Normal  . Order #: [id number] Class: Normal  . Order #: [id number] Class: Normal  . Order #: [id number] Class: Normal  . Order #: [id number] Class: Normal  . Order #: [id number] Class:  Normal  . Order #: [id number] Class: Normal  . Order #: [id number] Class: Normal  . Order #: [id number] Class: Normal  . Order #: [id number] Class: Normal  . Order #: [id number] Class: Normal  . Order #: [id number] Class: Normal  . Order #: [id number] Class: Normal  . Order #: [id number] Class: Fax  . Order #: [id number] Class: Normal  . Order #: [id number] Class: Normal  . Order #: [id number] Class: Normal  . Order #: [id number] Class: Normal  . Order #: [id number] Class: Normal  . Order #: [id number] Class: Normal  . Order #: [id number] Class: Normal  . Order #: [id number] Class: Normal  . Order #: [id number] Class: Normal    Allergies Shellfish allergy, Dulaglutide, and Metformin and related  Family History  Problem Relation Age of Onset  . Diabetes Mother   . Hypertension Mother   . Hypertension Father   . Diabetes Father     Social History Social History   Tobacco Use  . Smoking status: Never Smoker  . Smokeless tobacco: Never Used  Substance Use Topics  . Alcohol use: No    Alcohol/week: 0.0 standard drinks  . Drug use: No    Review of Systems  All other systems negative except as documented in the HPI. All pertinent positives and negatives as reviewed in the HPI. ____________________________________________   PHYSICAL EXAM:  VITAL SIGNS: ED Triage Vitals  Enc Vitals Group     BP 09/11/19 2350 (!) 165/117     Pulse Rate 09/11/19 2350 (!) 113     Resp 09/11/19 2350 15     Temp 09/11/19 2350 99.2 F (37.3 C)     Temp Source 09/11/19 2350 Oral     SpO2 09/11/19 2350 97 %     Weight 09/12/19 0002 186 lb 8.2 oz (84.6 kg)    Constitutional: Alert and oriented. Well appearing and in no acute distress. Eyes: Conjunctivae are normal. PERRL. EOMI. Head: Atraumatic. Nose: No congestion/rhinnorhea. Mouth/Throat: Mucous membranes are moist.  Oropharynx non-erythematous. Neck: No stridor.  No meningeal signs.   Cardiovascular: tachycardic rate, regular rhythm. Good peripheral circulation.  Grossly normal heart sounds.   Respiratory: tachypneic respiratory effort.  No retractions. Lungs mild basilar crackles. Gastrointestinal: Soft and nontender. Mild distention.  Musculoskeletal: No lower extremity tenderness nor edema. No gross deformities of extremities. Neurologic:  Normal speech and language. No gross focal neurologic deficits are appreciated.  Skin:  Skin is warm, dry and intact. No rash noted.  ____________________________________________   LABS (all labs ordered are listed, but only abnormal results are displayed)  Labs Reviewed  DIGOXIN LEVEL - Abnormal; Notable for the following components:      Result Value   Digoxin Level <0.2 (*)    All other components within normal limits  CBC WITH DIFFERENTIAL/PLATELET - Abnormal; Notable for the following components:   WBC 12.2 (*)    Hemoglobin 11.3 (*)    HCT 35.9 (*)    MCH 25.5 (*)    Neutro Abs 8.8 (*)  All other components within normal limits  COMPREHENSIVE METABOLIC PANEL - Abnormal; Notable for the following components:   CO2 21 (*)    Glucose, Bld 236 (*)    Albumin 2.9 (*)    AST 42 (*)    All other components within normal limits  BRAIN NATRIURETIC PEPTIDE - Abnormal; Notable for the following components:   B Natriuretic Peptide 649.9 (*)    All other components within normal limits  TROPONIN I (HIGH SENSITIVITY) - Abnormal; Notable for the following components:   Troponin I (High Sensitivity) 20 (*)    All other components within normal limits  TROPONIN I (HIGH SENSITIVITY) - Abnormal; Notable for the following components:   Troponin I (High Sensitivity) 25 (*)    All other components within normal limits  PROTIME-INR   ____________________________________________  EKG   EKG Interpretation  Date/Time:  Wednesday September 11 2019 23:52:14 EST Ventricular Rate:  112 PR Interval:    QRS Duration: 81 QT Interval:  393 QTC Calculation: 537 R Axis:   -3 Text Interpretation: Sinus  tachycardia Probable left atrial enlargement Borderline T abnormalities, lateral leads Prolonged QT interval similar to previous in september Confirmed by Merrily Pew [phone]) on 09/12/2019 12:13:15 AM       ____________________________________________  RADIOLOGY  DG Chest Portable 1 View  Result Date: 09/12/2019 CLINICAL DATA:  Cough.  Shortness of breath. EXAM: PORTABLE CHEST 1 VIEW COMPARISON:  03/27/2019 FINDINGS: The heart size is enlarged. There are probable small bilateral pleural effusions with adjacent atelectasis. There is vascular congestion. There is no focal infiltrate. IMPRESSION: Cardiomegaly with vascular congestion and possible small bilateral pleural effusions. Electronically Signed   By: Constance Holster M.D.   On: 09/12/2019 00:25    ____________________________________________   PROCEDURES  Procedure(s) performed:   Procedures   ____________________________________________   INITIAL IMPRESSION / ASSESSMENT AND PLAN / ED COURSE  Likely HF exacerbation. Is slightly tachypneic, difficulty speaking in full sentences but no increased WOB otherwise or e/o rep distress. Currently not hypoxic. Will check basic labs. Try to diurese in ED and hope for discharge.   Persistently with normal oxygen. On reevaluation patient not tachypneic, speaking in full sentences until I point it out at which time she starts to breathe faster. Had had significant diuresis with IV lasix, suspect it will continue with daily diuretics at home pending cards follow up.    Pertinent labs & imaging results that were available during my care of the patient were reviewed by me and considered in my medical decision making (see chart for details).  A medical screening exam was performed and I feel the patient has had an appropriate workup for their chief complaint at this time and likelihood of emergent condition existing is low. They have been counseled on decision, discharge, follow up and  which symptoms necessitate immediate return to the emergency department. They or their family verbally stated understanding and agreement with plan and discharged in stable condition.   ____________________________________________  FINAL CLINICAL IMPRESSION(S) / ED DIAGNOSES  Final diagnoses:  Nonspecific chest pain  Acute on chronic congestive heart failure, unspecified heart failure type (HCC)  Atypical chest pain     MEDICATIONS GIVEN DURING THIS VISIT:  Medications  furosemide (LASIX) injection 80 mg (80 mg Intravenous Given 09/12/19 0254)     NEW OUTPATIENT MEDICATIONS STARTED DURING THIS VISIT:  New Prescriptions   No medications on file    Note:  This note was prepared with assistance of Dragon voice recognition software. Occasional  wrong-word or sound-a-like substitutions may have occurred due to the inherent limitations of voice recognition software.   [place] Phifer, Corene Cornea, MD 09/12/19 [phone]

## 2019-09-12 NOTE — ED Notes (Signed)
C/o a headache 

## 2019-09-12 NOTE — ED Notes (Signed)
Med given 

## 2019-09-12 NOTE — ED Notes (Addendum)
The pt appears to be more concerned over food and something to drink at present.  Although the pt has vomited  Several times tonight

## 2019-09-12 NOTE — ED Triage Notes (Signed)
The pt arrived by [facility] from home  Chest pain sob   Hx chf nausea and vomiting.  Weight gain the past month  Iv per ems

## 2019-09-13 ENCOUNTER — Telehealth: Payer: Self-pay | Admitting: Pharmacist

## 2019-09-13 DIAGNOSIS — R110 Nausea: Secondary | ICD-10-CM

## 2019-09-13 NOTE — Telephone Encounter (Signed)
Reviewed and agree.

## 2019-09-13 NOTE — Telephone Encounter (Signed)
Contacted patient RE Diabetes follow.    Patient shared that she has recently gone to the ER via ambulance, vomited during the entire transport, received diuretic via IV and was sent home.  She was not admitted.  She admits not taking her torsemide.   She complained of continued nausea and requested new Rx for nausea medicine. I communicated that I could not refill this medication and would share with her PCP, Dr. Criss Rosales.   She reports blood sugars 288 in ER and lower readings at home including a reading in high 100s recently.  She reports decreasing (eliminating) regular soda but also reported drinking water or juice.  Encouraged to decrease intake of Juice and limit other carbohydrates which are leading to her elevated blood glucose readings.  She remains uninterested in restarting insulin therapy due to cost at this time.   Patient reports her weight had increased to 190 in the ER and was reduced to 185 when she left.  She states her weight has been closer to 170 (dry weight).   Patient was encouraged to take her torsemide and monitor her weight over the weekend.   We agreed that if she was not being seen early next week by cardiology (awaiting scheduler to call her back) then she should attempt to be seen here at the [facility].   IF she is seen at the [facility] and is amenable to starting insulin, I believe a sample of basal insulin (tresiba) could be considered at that time.   Given persistent nausea, I am not sure that this complaint was due to Trulicity (delalutin) as previously suspected.

## 2019-09-19 MED ORDER — MECLIZINE HCL 12.5 MG PO TABS: 12.5000 mg | ORAL_TABLET | Freq: Three times a day (TID) | ORAL | 0 refills | Status: DC | PRN

## 2019-09-19 NOTE — Telephone Encounter (Signed)
Patient calls nurse line stating she is still having nausea and vomiting. Patient stated she stopped Trulicity [id number] ago with no improvement in GI symptoms. Patient has taken a home pregnancy test with negative result, and last LMP 09/16/2019. Patient stated she is continuing all other medications and has no idea or can pin point why she is having so many GI issues. Patient scheduled with PCP 03/2. ED precautions given.

## 2019-09-19 NOTE — Telephone Encounter (Signed)
Please call patient, meclizine for nausea sent to [facility] on [address].  If she would like to have an appointment with a physician prior to the third when she is scheduled to see me, please help her schedule with any of the other physicians in our office.  Dr. Criss Rosales

## 2019-09-19 NOTE — Addendum Note (Signed)
Addended by: Coral Else on: 09/19/2019 06:44 PM   Modules accepted: Orders

## 2019-09-23 NOTE — Telephone Encounter (Signed)
Spoke to pt. At this point, pt will wait to see Dr. Criss Rosales. Pt has gotten meclizine. Ottis Stain, CMA

## 2019-09-24 ENCOUNTER — Encounter (HOSPITAL_COMMUNITY): Payer: Self-pay | Admitting: Emergency Medicine

## 2019-09-24 ENCOUNTER — Other Ambulatory Visit: Payer: Self-pay

## 2019-09-24 ENCOUNTER — Emergency Department (HOSPITAL_COMMUNITY)
Admission: EM | Admit: 2019-09-24 | Discharge: 2019-09-25 | Disposition: A | Discharge status: Home / Self Care | Payer: Medicaid Other | Attending: Emergency Medicine | Admitting: Emergency Medicine

## 2019-09-24 ENCOUNTER — Emergency Department (HOSPITAL_COMMUNITY): Payer: Medicaid Other

## 2019-09-24 DIAGNOSIS — E119 Type 2 diabetes mellitus without complications: Secondary | ICD-10-CM | POA: Insufficient documentation

## 2019-09-24 DIAGNOSIS — I509 Heart failure, unspecified: Secondary | ICD-10-CM | POA: Diagnosis not present

## 2019-09-24 DIAGNOSIS — R0602 Shortness of breath: Secondary | ICD-10-CM | POA: Diagnosis not present

## 2019-09-24 DIAGNOSIS — Z7984 Long term (current) use of oral hypoglycemic drugs: Secondary | ICD-10-CM | POA: Diagnosis not present

## 2019-09-24 DIAGNOSIS — I110 Hypertensive heart disease with heart failure: Secondary | ICD-10-CM | POA: Diagnosis not present

## 2019-09-24 DIAGNOSIS — R079 Chest pain, unspecified: Secondary | ICD-10-CM | POA: Diagnosis present

## 2019-09-24 DIAGNOSIS — Z79899 Other long term (current) drug therapy: Secondary | ICD-10-CM | POA: Diagnosis not present

## 2019-09-24 DIAGNOSIS — Z7901 Long term (current) use of anticoagulants: Secondary | ICD-10-CM | POA: Insufficient documentation

## 2019-09-24 LAB — CBC
HCT: 35.9 % — ABNORMAL LOW (ref 36.0–46.0)
Hemoglobin: 11.4 g/dL — ABNORMAL LOW (ref 12.0–15.0)
MCH: 25.1 pg — ABNORMAL LOW (ref 26.0–34.0)
MCHC: 31.8 g/dL (ref 30.0–36.0)
MCV: 79.1 fL — ABNORMAL LOW (ref 80.0–100.0)
Platelets: 359 10*3/uL (ref 150–400)
RBC: 4.54 MIL/uL (ref 3.87–5.11)
RDW: 13.8 % (ref 11.5–15.5)
WBC: 9.8 10*3/uL (ref 4.0–10.5)
nRBC: 0 % (ref 0.0–0.2)

## 2019-09-24 LAB — PROTIME-INR
INR: 1.1 (ref 0.8–1.2)
Prothrombin Time: 13.7 seconds (ref 11.4–15.2)

## 2019-09-24 LAB — BASIC METABOLIC PANEL
Anion gap: 10 (ref 5–15)
BUN: 12 mg/dL (ref 6–20)
CO2: 23 mmol/L (ref 22–32)
Calcium: 8.9 mg/dL (ref 8.9–10.3)
Chloride: 99 mmol/L (ref 98–111)
Creatinine, Ser: 0.86 mg/dL (ref 0.44–1.00)
GFR calc Af Amer: >60 mL/min (ref >60)
GFR calc non Af Amer: >60 mL/min (ref >60)
Glucose, Bld: 396 mg/dL — ABNORMAL HIGH (ref 70–99)
Potassium: 4.1 mmol/L (ref 3.5–5.1)
Sodium: 132 mmol/L — ABNORMAL LOW (ref 135–145)

## 2019-09-24 LAB — TROPONIN I (HIGH SENSITIVITY): Troponin I (High Sensitivity): 23 ng/L — ABNORMAL HIGH (ref <18)

## 2019-09-24 MED ORDER — SODIUM CHLORIDE 0.9% FLUSH
3.0000 mL | Freq: Once | INTRAVENOUS | Status: AC
  Administered 2019-09-25: 3 mL via INTRAVENOUS

## 2019-09-24 NOTE — ED Triage Notes (Signed)
Patient arrived with EMS from home reports central chest pain/tightness with SOB , weight gain and edema at abdomen and lower legs this week , history of CHF , no emesis and diaphoresis , CBG= 484 she is not taking insulin .

## 2019-09-25 ENCOUNTER — Telehealth (INDEPENDENT_AMBULATORY_CARE_PROVIDER_SITE_OTHER): Payer: Self-pay | Admitting: Family Medicine

## 2019-09-25 ENCOUNTER — Encounter: Payer: Self-pay | Admitting: Family Medicine

## 2019-09-25 LAB — BRAIN NATRIURETIC PEPTIDE: B Natriuretic Peptide: 277.8 pg/mL — ABNORMAL HIGH (ref 0.0–100.0)

## 2019-09-25 LAB — TROPONIN I (HIGH SENSITIVITY): Troponin I (High Sensitivity): 23 ng/L — ABNORMAL HIGH (ref <18)

## 2019-09-25 LAB — DIGOXIN LEVEL: Digoxin Level: <0.2 ng/mL — ABNORMAL LOW (ref 0.8–2.0)

## 2019-09-25 MED ORDER — FUROSEMIDE 10 MG/ML IJ SOLN
40.0000 mg | INTRAMUSCULAR | Status: AC
  Administered 2019-09-25: 40 mg via INTRAVENOUS
  Filled 2019-09-25: qty 4

## 2019-09-25 NOTE — Progress Notes (Signed)
Attempted to call patient and she did not pick up, voicemail was full and I was unable to leave message  If she calls back, please apologize to her.  I had to call her later than scheduled due to me running behind schedule.  This is not her fault, please schedule her for another telemed visit at her convenience and ask her to clear her voicemail so that people can leave messages for her from the clinic.  -Dr. Saumya Hukill

## 2019-09-25 NOTE — ED Provider Notes (Signed)
[facility] EMERGENCY DEPARTMENT Provider Note   CSN: [id number] Arrival date & time: 09/24/19  2223     History Chief Complaint  Patient presents with  . Chest Pain    [name] is a 44 y.o. female.  Patient presents to the emergency department with a chief complaint of chest pain and shortness of breath.  She has a history of CHF, A. fib, diabetes, and hypertension.  She states that she has been feeling increasingly short of breath over the past couple of weeks.  She has been taking her Lasix as prescribed.  She reports increased swelling of her lower extremities.  She reports increased dyspnea with exertion.  She states that she struggles to lie flat at night due to her breathing.  She denies any fever, chills, or productive cough.  The history is provided by the patient. No language interpreter was used.       Past Medical History:  Diagnosis Date  . Abnormal Pap smear   . Acute on chronic combined systolic and diastolic CHF (congestive heart failure) ([place]) 02/19/2019  . Acute on chronic congestive heart failure ([place])   . Anemia   . Asthma   . Atrial fibrillation ([place])   . Diabetes mellitus   . DKA, type 2 ([place]) 02/06/2019  . Heart failure with reduced ejection fraction ([place])   . Hypertension   . 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    . Migraines   . Morbid obesity ([place]) 02/06/2019  . Obstructive sleep apnea 06/30/2015  . Polycystic ovarian disease     Patient Active Problem List   Diagnosis Date Noted  . Attempting to conceive 03/10/2019  . Asthma   . Yeast infection 02/06/2019  . Right ventricular failure ([place]) 02/06/2019  . Morbid obesity ([place]) 02/06/2019  . HFrEF (heart failure with reduced ejection fraction) ([place]) 12/31/2018  . Paroxysmal atrial fibrillation ([place]) 12/31/2018  . Anxiety 12/31/2018  . Hyperlipidemia associated  with type 2 diabetes mellitus ([place]) 10/30/2017  . Obstructive sleep apnea 06/30/2015  . Anemia due to blood loss, chronic 06/27/2012  . Allergic rhinitis 06/27/2012  . Major depressive disorder 02/08/2012  . Hypertension associated with diabetes ([place]) 12/04/2011  . Dysmenorrhea 11/22/2011  . DM2 (diabetes mellitus, type 2) ([place]) 05/19/2011  . PCOS (polycystic ovarian syndrome) 05/19/2011  . Hydrosalpinx 08/21/2009    Past Surgical History:  Procedure Laterality Date  . CERVIX LESION DESTRUCTION    . CESAREAN SECTION    . RIGHT/LEFT HEART CATH AND CORONARY ANGIOGRAPHY N/A 03/29/2019   Procedure: RIGHT/LEFT HEART CATH AND CORONARY ANGIOGRAPHY;  Surgeon: Larey Dresser, MD;  Location: [facility];  Service: Cardiovascular;  Laterality: N/A;     OB History    Gravida  2   Para  2   Term  2   Preterm      AB      Living  2     SAB      TAB      Ectopic      Multiple      Live Births  2           Family History  Problem Relation Age of Onset  . Diabetes Mother   . Hypertension Mother   . Hypertension Father   . Diabetes Father     Social History   Tobacco Use  .  Smoking status: Never Smoker  . Smokeless tobacco: Never Used  Substance Use Topics  . Alcohol use: No    Alcohol/week: 0.0 standard drinks  . Drug use: No    Home Medications Prior to Admission medications   Medication Sig Start Date End Date Taking? Authorizing Provider  Accu-Chek Softclix Lancets lancets Use as instructed 02/12/19   Caroline More, DO  Accu-Chek Softclix Lancets lancets Check cbg 2 times daily 08/16/19   [name], DO  albuterol (VENTOLIN HFA) 108 (90 Base) MCG/ACT inhaler Inhale 2 puffs into the lungs every 6 (six) hours as needed for wheezing or shortness of breath. 09/04/19   [name], DO  atorvastatin (LIPITOR) 40 MG tablet Take 1 tablet (40 mg total) by mouth daily at 6 PM. 04/02/19   Larey Dresser, MD  Blood Glucose Monitoring Suppl (ACCU-CHEK AVIVA  PLUS) w/Device KIT 1 application by Does not apply route QID. Check fasting sugars as well as throughout the day 02/12/19   Caroline More, DO  carvedilol (COREG) 6.25 MG tablet Take 2 tablets (12.5 mg total) by mouth 2 (two) times daily with a meal. 06/17/19   Lyda Jester M, PA-C  digoxin (LANOXIN) 0.125 MG tablet Take 1 tablet (0.125 mg total) by mouth daily. 04/03/19   Larey Dresser, MD  DULoxetine HCl 40 MG CPEP Take 40 mg by mouth daily. 07/15/19 10/13/19  [name], DO  empagliflozin (JARDIANCE) 25 MG TABS tablet Take 25 mg by mouth daily. 07/31/19   [name], DO  ferrous sulfate 325 (65 FE) MG tablet Take 1 tablet (325 mg total) by mouth every other day. 04/04/19   Kathrene Alu, MD  gabapentin (NEURONTIN) 100 MG capsule Take 1 capsule (100 mg total) by mouth every 8 (eight) hours. 04/02/19   Kathrene Alu, MD  glucose blood (ACCU-CHEK AVIVA PLUS) test strip Check CBG 2 times per day 08/16/19   [name], DO  glucose blood (AGAMATRIX PRESTO TEST) test strip Use as instructed 10/23/17   Zenia Resides, MD  Lancets Misc. (ACCU-CHEK SOFTCLIX LANCET DEV) KIT 1 application by Does not apply route 2 (two) times daily. 08/16/19   [name], DO  meclizine (ANTIVERT) 12.5 MG tablet Take 1 tablet (12.5 mg total) by mouth 3 (three) times daily as needed for dizziness. 09/19/19   [name], DO  methocarbamol (ROBAXIN) 500 MG tablet Take 1 tablet (500 mg total) by mouth every 8 (eight) hours as needed for muscle spasms. 07/13/19   [name], DO  miconazole (MICONAZOLE 7) 100 MG vaginal suppository Place 1 suppository (100 mg total) vaginally at bedtime. 08/09/19   [name], DO  potassium chloride SA (KLOR-CON) 20 MEQ tablet Take 1 tablet every Monday, Wednesday, and Friday. 06/17/19   Lyda Jester M, PA-C  sacubitril-valsartan (ENTRESTO) 49-51 MG Take 1 tablet by mouth 2 (two) times daily. 06/17/19   Consuelo Pandy, PA-C  spironolactone (ALDACTONE) 25 MG tablet Take  1 tablet (25 mg total) by mouth daily. 04/03/19 08/01/19  Larey Dresser, MD  terconazole (TERAZOL 3) 0.8 % vaginal cream Place 1 applicator vaginally at bedtime. 08/09/19   [name], DO  torsemide (DEMADEX) 20 MG tablet Take '40mg'$  on Monday, Wednesday, and Friday. 06/17/19   Lyda Jester M, PA-C  traZODone (DESYREL) 50 MG tablet Take 1 tablet (50 mg total) by mouth at bedtime as needed. for sleep 08/22/19   Rory Percy, DO  warfarin (COUMADIN) 7.5 MG tablet Take 1 tablet (7.5 mg total) by  mouth daily at 6 PM. 04/02/19   Larey Dresser, MD    Allergies    Shellfish allergy, Dulaglutide, and Metformin and related  Review of Systems   Review of Systems  All other systems reviewed and are negative.   Physical Exam Updated Vital Signs BP (!) 162/119 (BP Location: Right Arm)   Pulse (!) 109   Temp 99.2 F (37.3 C) (Oral)   Resp 16   Ht '4\' 11"'$  (1.499 m)   Wt 100 kg   LMP 09/19/2019   SpO2 98%   BMI 44.53 kg/m   Physical Exam Vitals and nursing note reviewed.  Constitutional:      General: She is not in acute distress.    Appearance: She is well-developed.  HENT:     Head: Normocephalic and atraumatic.  Eyes:     Conjunctiva/sclera: Conjunctivae normal.  Cardiovascular:     Rate and Rhythm: Regular rhythm. Tachycardia present.     Heart sounds: No murmur.  Pulmonary:     Effort: Pulmonary effort is normal. No respiratory distress.     Breath sounds: Normal breath sounds.     Comments: Lungs are clear Abdominal:     Palpations: Abdomen is soft.     Tenderness: There is no abdominal tenderness.  Musculoskeletal:        General: Normal range of motion.     Cervical back: Neck supple.     Right lower leg: Edema present.     Left lower leg: Edema present.     Comments: 1+ pitting edema in bilateral lower extremities  Skin:    General: Skin is warm and dry.  Neurological:     Mental Status: She is alert and oriented to person, place, and time.  Psychiatric:         Mood and Affect: Mood normal.        Behavior: Behavior normal.     ED Results / Procedures / Treatments   Labs (all labs ordered are listed, but only abnormal results are displayed) Labs Reviewed  BASIC METABOLIC PANEL - Abnormal; Notable for the following components:      Result Value   Sodium 132 (*)    Glucose, Bld 396 (*)    All other components within normal limits  CBC - Abnormal; Notable for the following components:   Hemoglobin 11.4 (*)    HCT 35.9 (*)    MCV 79.1 (*)    MCH 25.1 (*)    All other components within normal limits  TROPONIN I (HIGH SENSITIVITY) - Abnormal; Notable for the following components:   Troponin I (High Sensitivity) 23 (*)    All other components within normal limits  TROPONIN I (HIGH SENSITIVITY) - Abnormal; Notable for the following components:   Troponin I (High Sensitivity) 23 (*)    All other components within normal limits  PROTIME-INR  BRAIN NATRIURETIC PEPTIDE  I-STAT BETA HCG BLOOD, ED ([facility], [facility], [facility] ONLY)    EKG EKG Interpretation  Date/Time:  Tuesday September 24 2019 22:30:58 EST Ventricular Rate:  111 PR Interval:  132 QRS Duration: 76 QT Interval:  330 QTC Calculation: 448 R Axis:   -37 Text Interpretation: Sinus tachycardia Possible Left atrial enlargement Left axis deviation Abnormal ECG No significant change since last tracing Confirmed by Merrily Pew [phone]) on 09/25/2019 1:12:01 AM   Radiology DG Chest 2 View  Result Date: 09/24/2019 CLINICAL DATA:  Chest pain EXAM: CHEST - 2 VIEW COMPARISON:  09/12/2019, 03/27/2019, 02/06/2019 FINDINGS: Moderate cardiomegaly  with slight central congestion but no pleural effusion or overt pulmonary edema. Linear atelectasis or scar in the left mid lung. No pneumothorax. IMPRESSION: Cardiomegaly with mild central congestion. Atelectasis or scarring in the left mid lung Electronically Signed   By: Donavan Foil M.D.   On: 09/24/2019 23:23    Procedures Procedures (including critical care  time)  Medications Ordered in ED Medications  sodium chloride flush (NS) 0.9 % injection 3 mL (has no administration in time range)  furosemide (LASIX) injection 40 mg (has no administration in time range)    ED Course  I have reviewed the triage vital signs and the nursing notes.  Pertinent labs & imaging results that were available during my care of the patient were reviewed by me and considered in my medical decision making (see chart for details).    MDM Rules/Calculators/A&P                      Patient here with shortness of breath and chest pain.  She has significant history of CHF.  Chest x-ray shows cardiomegaly with mild central congestion.  Initial troponin in triage is 23, repeat troponin is 23.  Laboratory work-up is otherwise about baseline for patient.  She is noted to be hyperglycemic to 396.  We will check BNP.  Will give dose of Lasix.  He is not hypoxic or in acute distress.  I suspect that if she has some significant improvement with diuresis in the emergency department that she can be discharged home.  5:28 AM Patient ambulates.  RR 18, O2 sat 100% on RA.  She has made ~1.5 L of urine.  I have advised her to continue with her regular medications.  She is sitting up in bed eating a sandwich and has no sign of respiratory distress.  Feel that she is stable for discharge.   Final Clinical Impression(s) / ED Diagnoses Final diagnoses:  SOB (shortness of breath)    Rx / DC Orders ED Discharge Orders    None       Montine Circle, PA-C 09/25/19 Sherril Cong, MD 09/25/19 [phone]

## 2019-09-25 NOTE — Discharge Instructions (Addendum)
Continue taking your regular medications.  If your shortness of breath worsens, please return to the ER.

## 2019-09-25 NOTE — ED Notes (Signed)
Patient walked well in the hallway  .she  Did well  .patientis back in bed purwick back on

## 2019-09-26 ENCOUNTER — Telehealth: Payer: Self-pay

## 2019-09-26 NOTE — Telephone Encounter (Signed)
Patient left message on nurse line regarding missed virtual visit yesterday. Informed patient that Dr. Criss Rosales attempted to call her yesterday, however, her voicemail was full and he was unable to leave VM.   Patient states that she will call the office after 3 pm to reschedule virtual visit.   Talbot Grumbling, RN

## 2019-09-27 NOTE — Telephone Encounter (Signed)
Patient calls nurse line regarding increased chest, stomach and back pain. Patient also reports increased SHOB. Pt reports pain level at 8/10.  Patient requesting rx for pain medication.   Consulted with Ardelia Mems about patient status. Due to patient's symptoms, patient is to be advised to return to ED for further symptom management. Patient informed but states that the ED is not helping her and that she does not want to return.   Explained to patient that these symptoms do need further evaluation. ED instructions given.  Talbot Grumbling, RN

## 2019-09-29 ENCOUNTER — Other Ambulatory Visit: Payer: Self-pay

## 2019-09-29 ENCOUNTER — Encounter (HOSPITAL_COMMUNITY): Payer: Self-pay

## 2019-09-29 ENCOUNTER — Emergency Department (HOSPITAL_COMMUNITY): Payer: Medicaid Other

## 2019-09-29 ENCOUNTER — Inpatient Hospital Stay (HOSPITAL_COMMUNITY)
Admission: EM | Admit: 2019-09-29 | Discharge: 2019-10-04 | DRG: 292 | Disposition: A | Discharge status: Home Health | Payer: Medicaid Other | Attending: Internal Medicine | Admitting: Internal Medicine

## 2019-09-29 DIAGNOSIS — Z20822 Contact with and (suspected) exposure to covid-19: Secondary | ICD-10-CM | POA: Diagnosis present

## 2019-09-29 DIAGNOSIS — E119 Type 2 diabetes mellitus without complications: Secondary | ICD-10-CM

## 2019-09-29 DIAGNOSIS — G4733 Obstructive sleep apnea (adult) (pediatric): Secondary | ICD-10-CM | POA: Diagnosis present

## 2019-09-29 DIAGNOSIS — Z7984 Long term (current) use of oral hypoglycemic drugs: Secondary | ICD-10-CM

## 2019-09-29 DIAGNOSIS — I5023 Acute on chronic systolic (congestive) heart failure: Secondary | ICD-10-CM | POA: Diagnosis present

## 2019-09-29 DIAGNOSIS — I5020 Unspecified systolic (congestive) heart failure: Secondary | ICD-10-CM | POA: Diagnosis present

## 2019-09-29 DIAGNOSIS — I509 Heart failure, unspecified: Secondary | ICD-10-CM

## 2019-09-29 DIAGNOSIS — E876 Hypokalemia: Secondary | ICD-10-CM | POA: Diagnosis present

## 2019-09-29 DIAGNOSIS — Z79899 Other long term (current) drug therapy: Secondary | ICD-10-CM

## 2019-09-29 DIAGNOSIS — I5041 Acute combined systolic (congestive) and diastolic (congestive) heart failure: Secondary | ICD-10-CM | POA: Diagnosis present

## 2019-09-29 DIAGNOSIS — Z7901 Long term (current) use of anticoagulants: Secondary | ICD-10-CM

## 2019-09-29 DIAGNOSIS — Z79891 Long term (current) use of opiate analgesic: Secondary | ICD-10-CM

## 2019-09-29 DIAGNOSIS — I5043 Acute on chronic combined systolic (congestive) and diastolic (congestive) heart failure: Secondary | ICD-10-CM | POA: Diagnosis present

## 2019-09-29 DIAGNOSIS — F329 Major depressive disorder, single episode, unspecified: Secondary | ICD-10-CM | POA: Diagnosis present

## 2019-09-29 DIAGNOSIS — E1165 Type 2 diabetes mellitus with hyperglycemia: Secondary | ICD-10-CM

## 2019-09-29 DIAGNOSIS — I472 Ventricular tachycardia: Secondary | ICD-10-CM | POA: Diagnosis not present

## 2019-09-29 DIAGNOSIS — R7989 Other specified abnormal findings of blood chemistry: Secondary | ICD-10-CM | POA: Diagnosis present

## 2019-09-29 DIAGNOSIS — Z713 Dietary counseling and surveillance: Secondary | ICD-10-CM

## 2019-09-29 DIAGNOSIS — K761 Chronic passive congestion of liver: Secondary | ICD-10-CM | POA: Diagnosis present

## 2019-09-29 DIAGNOSIS — I160 Hypertensive urgency: Secondary | ICD-10-CM | POA: Diagnosis present

## 2019-09-29 DIAGNOSIS — R791 Abnormal coagulation profile: Secondary | ICD-10-CM | POA: Diagnosis present

## 2019-09-29 DIAGNOSIS — Z8249 Family history of ischemic heart disease and other diseases of the circulatory system: Secondary | ICD-10-CM

## 2019-09-29 DIAGNOSIS — E282 Polycystic ovarian syndrome: Secondary | ICD-10-CM | POA: Diagnosis present

## 2019-09-29 DIAGNOSIS — R109 Unspecified abdominal pain: Secondary | ICD-10-CM | POA: Diagnosis present

## 2019-09-29 DIAGNOSIS — I480 Paroxysmal atrial fibrillation: Secondary | ICD-10-CM | POA: Diagnosis present

## 2019-09-29 DIAGNOSIS — I214 Non-ST elevation (NSTEMI) myocardial infarction: Secondary | ICD-10-CM

## 2019-09-29 DIAGNOSIS — I5082 Biventricular heart failure: Secondary | ICD-10-CM | POA: Diagnosis present

## 2019-09-29 DIAGNOSIS — Z6841 Body Mass Index (BMI) 40.0 and over, adult: Secondary | ICD-10-CM

## 2019-09-29 DIAGNOSIS — J309 Allergic rhinitis, unspecified: Secondary | ICD-10-CM | POA: Diagnosis present

## 2019-09-29 DIAGNOSIS — I428 Other cardiomyopathies: Secondary | ICD-10-CM | POA: Diagnosis present

## 2019-09-29 DIAGNOSIS — Z833 Family history of diabetes mellitus: Secondary | ICD-10-CM

## 2019-09-29 DIAGNOSIS — I5042 Chronic combined systolic (congestive) and diastolic (congestive) heart failure: Secondary | ICD-10-CM | POA: Diagnosis present

## 2019-09-29 DIAGNOSIS — Z9114 Patient's other noncompliance with medication regimen: Secondary | ICD-10-CM

## 2019-09-29 DIAGNOSIS — I110 Hypertensive heart disease with heart failure: Principal | ICD-10-CM | POA: Diagnosis present

## 2019-09-29 DIAGNOSIS — Z91013 Allergy to seafood: Secondary | ICD-10-CM

## 2019-09-29 DIAGNOSIS — G8929 Other chronic pain: Secondary | ICD-10-CM

## 2019-09-29 DIAGNOSIS — I248 Other forms of acute ischemic heart disease: Secondary | ICD-10-CM | POA: Diagnosis present

## 2019-09-29 DIAGNOSIS — I161 Hypertensive emergency: Secondary | ICD-10-CM | POA: Diagnosis present

## 2019-09-29 DIAGNOSIS — E785 Hyperlipidemia, unspecified: Secondary | ICD-10-CM | POA: Diagnosis present

## 2019-09-29 DIAGNOSIS — Z888 Allergy status to other drugs, medicaments and biological substances status: Secondary | ICD-10-CM

## 2019-09-29 DIAGNOSIS — R778 Other specified abnormalities of plasma proteins: Secondary | ICD-10-CM | POA: Diagnosis present

## 2019-09-29 LAB — CBC WITH DIFFERENTIAL/PLATELET
Abs Immature Granulocytes: 0.04 10*3/uL (ref 0.00–0.07)
Basophils Absolute: 0.1 10*3/uL (ref 0.0–0.1)
Basophils Relative: 1 %
Eosinophils Absolute: 0 10*3/uL (ref 0.0–0.5)
Eosinophils Relative: 0 %
HCT: 38.6 % (ref 36.0–46.0)
Hemoglobin: 12 g/dL (ref 12.0–15.0)
Immature Granulocytes: 0 %
Lymphocytes Relative: 26 %
Lymphs Abs: 3 10*3/uL (ref 0.7–4.0)
MCH: 24.3 pg — ABNORMAL LOW (ref 26.0–34.0)
MCHC: 31.1 g/dL (ref 30.0–36.0)
MCV: 78.1 fL — ABNORMAL LOW (ref 80.0–100.0)
Monocytes Absolute: 0.7 10*3/uL (ref 0.1–1.0)
Monocytes Relative: 6 %
Neutro Abs: 7.7 10*3/uL (ref 1.7–7.7)
Neutrophils Relative %: 67 %
Platelets: 400 10*3/uL (ref 150–400)
RBC: 4.94 MIL/uL (ref 3.87–5.11)
RDW: 14.1 % (ref 11.5–15.5)
WBC: 11.4 10*3/uL — ABNORMAL HIGH (ref 4.0–10.5)
nRBC: 0 % (ref 0.0–0.2)

## 2019-09-29 LAB — BASIC METABOLIC PANEL
Anion gap: 11 (ref 5–15)
BUN: 19 mg/dL (ref 6–20)
CO2: 24 mmol/L (ref 22–32)
Calcium: 9 mg/dL (ref 8.9–10.3)
Chloride: 95 mmol/L — ABNORMAL LOW (ref 98–111)
Creatinine, Ser: 0.93 mg/dL (ref 0.44–1.00)
GFR calc Af Amer: >60 mL/min (ref >60)
GFR calc non Af Amer: >60 mL/min (ref >60)
Glucose, Bld: 395 mg/dL — ABNORMAL HIGH (ref 70–99)
Potassium: 4.7 mmol/L (ref 3.5–5.1)
Sodium: 130 mmol/L — ABNORMAL LOW (ref 135–145)

## 2019-09-29 LAB — BRAIN NATRIURETIC PEPTIDE: B Natriuretic Peptide: 562.9 pg/mL — ABNORMAL HIGH (ref 0.0–100.0)

## 2019-09-29 MED ORDER — FUROSEMIDE 10 MG/ML IJ SOLN
40.0000 mg | Freq: Once | INTRAMUSCULAR | Status: AC
  Administered 2019-09-29: 40 mg via INTRAVENOUS
  Filled 2019-09-29: qty 4

## 2019-09-29 MED ORDER — MORPHINE SULFATE (PF) 4 MG/ML IV SOLN
4.0000 mg | Freq: Once | INTRAVENOUS | Status: AC
  Administered 2019-09-29: 4 mg via INTRAVENOUS
  Filled 2019-09-29 (×2): qty 1

## 2019-09-29 NOTE — ED Notes (Signed)
X-ray at bedside

## 2019-09-29 NOTE — ED Notes (Signed)
Bedside commode placed in pt room

## 2019-09-29 NOTE — ED Provider Notes (Addendum)
[facility] DEPT Provider Note   CSN: [id number] Arrival date & time: 09/29/19  1736     History Chief Complaint  Patient presents with  . Chest Pain  . Shortness of Breath    [name] is a 44 y.o. female with a past medical history of CHF, atrial fibrillation, diabetes, obesity, hypertension presenting to the ED with continued chest pain and shortness of breath for the past month.  Patient states that this is her third visit in the past month for the similar symptoms and was told it was due to her heart failure.  She has been taking her prescribed Lasix but did not take it today.  States that the only reason she came to the ER today was to get medication to help with her pain.  She has not spoken to her primary care provider regarding changes to her medications with her heart failure.  Denies any vomiting, hemoptysis, unilateral leg swelling, sick contacts with similar symptoms, abdominal pain, diarrhea or fever.  HPI     Past Medical History:  Diagnosis Date  . Abnormal Pap smear   . Acute on chronic combined systolic and diastolic CHF (congestive heart failure) ([place]) 02/19/2019  . Acute on chronic congestive heart failure ([place])   . Anemia   . Asthma   . Atrial fibrillation ([place])   . Diabetes mellitus   . DKA, type 2 ([place]) 02/06/2019  . Heart failure with reduced ejection fraction ([place])   . Hypertension   . 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    . Migraines   . Morbid obesity ([place]) 02/06/2019  . Obstructive sleep apnea 06/30/2015  . Polycystic ovarian disease     Patient Active Problem List   Diagnosis Date Noted  . Attempting to conceive 03/10/2019  . Asthma   . Yeast infection 02/06/2019  . Right ventricular failure ([place]) 02/06/2019  . Morbid obesity ([place]) 02/06/2019  . HFrEF (heart failure with reduced ejection fraction)  ([place]) 12/31/2018  . Paroxysmal atrial fibrillation ([place]) 12/31/2018  . Anxiety 12/31/2018  . Hyperlipidemia associated with type 2 diabetes mellitus ([place]) 10/30/2017  . Obstructive sleep apnea 06/30/2015  . Anemia due to blood loss, chronic 06/27/2012  . Allergic rhinitis 06/27/2012  . Major depressive disorder 02/08/2012  . Hypertension associated with diabetes ([place]) 12/04/2011  . Dysmenorrhea 11/22/2011  . DM2 (diabetes mellitus, type 2) ([place]) 05/19/2011  . PCOS (polycystic ovarian syndrome) 05/19/2011  . Hydrosalpinx 08/21/2009    Past Surgical History:  Procedure Laterality Date  . CERVIX LESION DESTRUCTION    . CESAREAN SECTION    . RIGHT/LEFT HEART CATH AND CORONARY ANGIOGRAPHY N/A 03/29/2019   Procedure: RIGHT/LEFT HEART CATH AND CORONARY ANGIOGRAPHY;  Surgeon: Larey Dresser, MD;  Location: [facility];  Service: Cardiovascular;  Laterality: N/A;     OB History    Gravida  2   Para  2   Term  2   Preterm      AB      Living  2     SAB      TAB      Ectopic      Multiple      Live Births  2           Family History  Problem Relation Age of Onset  . Diabetes Mother   . Hypertension  Mother   . Hypertension Father   . Diabetes Father     Social History   Tobacco Use  . Smoking status: Never Smoker  . Smokeless tobacco: Never Used  Substance Use Topics  . Alcohol use: No    Alcohol/week: 0.0 standard drinks  . Drug use: No    Home Medications Prior to Admission medications   Medication Sig Start Date End Date Taking? Authorizing Provider  albuterol (VENTOLIN HFA) 108 (90 Base) MCG/ACT inhaler Inhale 2 puffs into the lungs every 6 (six) hours as needed for wheezing or shortness of breath. 09/04/19  Yes [name], DO  atorvastatin (LIPITOR) 40 MG tablet Take 1 tablet (40 mg total) by mouth daily at 6 PM. 04/02/19  Yes Larey Dresser, MD  carvedilol (COREG) 6.25 MG tablet Take 2 tablets (12.5 mg total) by mouth 2 (two) times daily  with a meal. 06/17/19  Yes Lyda Jester M, PA-C  digoxin (LANOXIN) 0.125 MG tablet Take 1 tablet (0.125 mg total) by mouth daily. 04/03/19  Yes Larey Dresser, MD  empagliflozin (JARDIANCE) 25 MG TABS tablet Take 25 mg by mouth daily. 07/31/19  Yes Bland, Scott, DO  gabapentin (NEURONTIN) 100 MG capsule Take 1 capsule (100 mg total) by mouth every 8 (eight) hours. 04/02/19  Yes Winfrey, Alcario Drought, MD  meclizine (ANTIVERT) 12.5 MG tablet Take 1 tablet (12.5 mg total) by mouth 3 (three) times daily as needed for dizziness. 09/19/19  Yes [name], DO  methocarbamol (ROBAXIN) 500 MG tablet Take 1 tablet (500 mg total) by mouth every 8 (eight) hours as needed for muscle spasms. 07/13/19  Yes Bland, Scott, DO  miconazole (MICONAZOLE 7) 100 MG vaginal suppository Place 1 suppository (100 mg total) vaginally at bedtime. 08/09/19  Yes [name], DO  potassium chloride SA (KLOR-CON) 20 MEQ tablet Take 1 tablet every Monday, Wednesday, and Friday. 06/17/19  Yes Simmons, Brittainy M, PA-C  sacubitril-valsartan (ENTRESTO) 49-51 MG Take 1 tablet by mouth 2 (two) times daily. 06/17/19  Yes Lyda Jester M, PA-C  terconazole (TERAZOL 3) 0.8 % vaginal cream Place 1 applicator vaginally at bedtime. 08/09/19  Yes [name], DO  torsemide (DEMADEX) 20 MG tablet Take 75m on Monday, Wednesday, and Friday. 06/17/19  Yes Simmons, Brittainy M, PA-C  traZODone (DESYREL) 50 MG tablet Take 1 tablet (50 mg total) by mouth at bedtime as needed. for sleep 08/22/19  Yes RRory Percy DO  warfarin (COUMADIN) 7.5 MG tablet Take 1 tablet (7.5 mg total) by mouth daily at 6 PM. 04/02/19  Yes MLarey Dresser MD  Accu-Chek Softclix Lancets lancets Use as instructed 02/12/19   ACaroline More DO  Accu-Chek Softclix Lancets lancets Check cbg 2 times daily 08/16/19   BSherene Sires DO  Blood Glucose Monitoring Suppl (ACCU-CHEK AVIVA PLUS) w/Device KIT 1 application by Does not apply route QID. Check fasting sugars as well as  throughout the day 02/12/19   ACaroline More DO  DULoxetine HCl 40 MG CPEP Take 40 mg by mouth daily. Patient not taking: Reported on 09/29/2019 07/15/19 10/13/19  BSherene Sires DO  ferrous sulfate 325 (65 FE) MG tablet Take 1 tablet (325 mg total) by mouth every other day. Patient not taking: Reported on 09/29/2019 04/04/19   WKathrene Alu MD  glucose blood (ACCU-CHEK AVIVA PLUS) test strip Check CBG 2 times per day 08/16/19   BSherene Sires DO  glucose blood (AGAMATRIX PRESTO TEST) test strip Use as instructed 10/23/17   HZenia Resides MD  Lancets Misc. (ACCU-CHEK SOFTCLIX LANCET DEV) KIT 1 application by Does not apply route 2 (two) times daily. 08/16/19   [name], DO  spironolactone (ALDACTONE) 25 MG tablet Take 1 tablet (25 mg total) by mouth daily. 04/03/19 08/01/19  Larey Dresser, MD    Allergies    Shellfish allergy, Dulaglutide, and Metformin and related  Review of Systems   Review of Systems  Constitutional: Negative for appetite change, chills and fever.  HENT: Negative for ear pain, rhinorrhea, sneezing and sore throat.   Eyes: Negative for photophobia and visual disturbance.  Respiratory: Positive for shortness of breath. Negative for cough, chest tightness and wheezing.   Cardiovascular: Positive for chest pain. Negative for palpitations.  Gastrointestinal: Negative for abdominal pain, blood in stool, constipation, diarrhea, nausea and vomiting.  Genitourinary: Negative for dysuria, hematuria and urgency.  Musculoskeletal: Negative for myalgias.  Skin: Negative for rash.  Neurological: Negative for dizziness, weakness and light-headedness.    Physical Exam Updated Vital Signs BP (!) 173/129   Pulse (!) 112   Temp 98 F (36.7 C) (Oral)   Resp (!) 32   Ht 4' 11" (1.499 m)   Wt 100 kg   LMP 09/19/2019   SpO2 94%   BMI 44.53 kg/m   Physical Exam Vitals and nursing note reviewed.  Constitutional:      General: She is not in acute distress.    Appearance: She  is well-developed. She is obese.  HENT:     Head: Normocephalic and atraumatic.     Nose: Nose normal.  Eyes:     General: No scleral icterus.       Left eye: No discharge.     Conjunctiva/sclera: Conjunctivae normal.  Cardiovascular:     Rate and Rhythm: Regular rhythm. Tachycardia present.     Heart sounds: Normal heart sounds. No murmur. No friction rub. No gallop.   Pulmonary:     Effort: Pulmonary effort is normal. No respiratory distress.     Breath sounds: Normal breath sounds.  Abdominal:     General: Bowel sounds are normal. There is no distension.     Palpations: Abdomen is soft.     Tenderness: There is no abdominal tenderness. There is no guarding.  Musculoskeletal:        General: Normal range of motion.     Cervical back: Normal range of motion and neck supple.     Right lower leg: Edema present.     Left lower leg: Edema present.     Comments: Trace edema in bilateral lower extremities.  Skin:    General: Skin is warm and dry.     Findings: No rash.  Neurological:     Mental Status: She is alert.     Motor: No abnormal muscle tone.     Coordination: Coordination normal.     ED Results / Procedures / Treatments   Labs (all labs ordered are listed, but only abnormal results are displayed) Labs Reviewed  BASIC METABOLIC PANEL - Abnormal; Notable for the following components:      Result Value   Sodium 130 (*)    Chloride 95 (*)    Glucose, Bld 395 (*)    All other components within normal limits  BRAIN NATRIURETIC PEPTIDE - Abnormal; Notable for the following components:   B Natriuretic Peptide 562.9 (*)    All other components within normal limits  CBC WITH DIFFERENTIAL/PLATELET - Abnormal; Notable for the following components:   WBC 11.4 (*)  MCV 78.1 (*)    MCH 24.3 (*)    All other components within normal limits    EKG EKG Interpretation  Date/Time:  _84  year old female with a past medical history of CHF, A. fib, diabetes, obesity and hypertension presenting to the ED with continued chest pain and shortness of breath for the past month.  This is her third visit in the past month with the symptoms.  She was told in the past it was due to her heart failure.  She states she has been taking her prescribed Lasix but did not take it today.  She has not seen her primary care provider about changes related to her Lasix.  States the main  reason she came to the ER today was to get pain medication.  On exam patient has some edema in lower extremities.  She is tachycardic and tachypneic.  Oxygen saturations between upper 80s and low 90s.  She is hypertensive and did not take her home antihypertensive today.  Lab work significant for BNP in the 500s, leukocytosis of 11.  EKG shows sinus tachycardia, no changes from prior tracings.  Chest x-ray with pulmonary vascular congestion.  Patient was given Lasix.  Due to her low oxygen saturations which different than baseline, will need to obtain CT angio to rule out PE.  If negative patient will need to follow-up with her primary care provider regarding medication adjustments. Can consider admission if she remains on supplemental oxygen or has continued symptoms. Of note, patient is not on Coumadin. Care handed off to oncoming team pending CTA to determine final disposition.  Final Clinical Impression(s) / ED Diagnoses Final diagnoses:  Acute on chronic congestive heart failure, unspecified heart failure type ([facility])    Rx / DC Orders ED Discharge Orders    None      Portions of this note were generated with Dragon dictation software. Dictation errors may occur despite best  attempts at proofreading.      Delia Heady, PA-C 09/29/19 2359    Hayden Rasmussen, MD 10/10/19 [phone]

## 2019-09-29 NOTE — ED Triage Notes (Signed)
Pt reports sharp chest pain and SHOB x1 month. Pt has been to [facility] a few times for same. Pt reports they diagnosed her with CHF and gave her a few prescriptions but she still feels the same.

## 2019-09-30 ENCOUNTER — Inpatient Hospital Stay (HOSPITAL_COMMUNITY): Payer: Medicaid Other

## 2019-09-30 ENCOUNTER — Emergency Department (HOSPITAL_COMMUNITY): Payer: Medicaid Other

## 2019-09-30 ENCOUNTER — Encounter (HOSPITAL_COMMUNITY): Payer: Self-pay

## 2019-09-30 DIAGNOSIS — E282 Polycystic ovarian syndrome: Secondary | ICD-10-CM | POA: Diagnosis present

## 2019-09-30 DIAGNOSIS — I480 Paroxysmal atrial fibrillation: Secondary | ICD-10-CM

## 2019-09-30 DIAGNOSIS — R778 Other specified abnormalities of plasma proteins: Secondary | ICD-10-CM

## 2019-09-30 DIAGNOSIS — I161 Hypertensive emergency: Secondary | ICD-10-CM | POA: Diagnosis present

## 2019-09-30 DIAGNOSIS — Z9114 Patient's other noncompliance with medication regimen: Secondary | ICD-10-CM | POA: Diagnosis not present

## 2019-09-30 DIAGNOSIS — I255 Ischemic cardiomyopathy: Secondary | ICD-10-CM

## 2019-09-30 DIAGNOSIS — Z91013 Allergy to seafood: Secondary | ICD-10-CM | POA: Diagnosis not present

## 2019-09-30 DIAGNOSIS — E119 Type 2 diabetes mellitus without complications: Secondary | ICD-10-CM

## 2019-09-30 DIAGNOSIS — J309 Allergic rhinitis, unspecified: Secondary | ICD-10-CM | POA: Diagnosis present

## 2019-09-30 DIAGNOSIS — R7989 Other specified abnormal findings of blood chemistry: Secondary | ICD-10-CM | POA: Diagnosis present

## 2019-09-30 DIAGNOSIS — I5023 Acute on chronic systolic (congestive) heart failure: Secondary | ICD-10-CM

## 2019-09-30 DIAGNOSIS — I110 Hypertensive heart disease with heart failure: Secondary | ICD-10-CM | POA: Diagnosis present

## 2019-09-30 DIAGNOSIS — E876 Hypokalemia: Secondary | ICD-10-CM | POA: Diagnosis present

## 2019-09-30 DIAGNOSIS — Z6841 Body Mass Index (BMI) 40.0 and over, adult: Secondary | ICD-10-CM | POA: Diagnosis not present

## 2019-09-30 DIAGNOSIS — Z20822 Contact with and (suspected) exposure to covid-19: Secondary | ICD-10-CM | POA: Diagnosis present

## 2019-09-30 DIAGNOSIS — K761 Chronic passive congestion of liver: Secondary | ICD-10-CM | POA: Diagnosis present

## 2019-09-30 DIAGNOSIS — Z888 Allergy status to other drugs, medicaments and biological substances status: Secondary | ICD-10-CM | POA: Diagnosis not present

## 2019-09-30 DIAGNOSIS — I5043 Acute on chronic combined systolic (congestive) and diastolic (congestive) heart failure: Secondary | ICD-10-CM | POA: Diagnosis present

## 2019-09-30 DIAGNOSIS — E785 Hyperlipidemia, unspecified: Secondary | ICD-10-CM | POA: Diagnosis present

## 2019-09-30 DIAGNOSIS — I472 Ventricular tachycardia: Secondary | ICD-10-CM | POA: Diagnosis not present

## 2019-09-30 DIAGNOSIS — I428 Other cardiomyopathies: Secondary | ICD-10-CM | POA: Diagnosis present

## 2019-09-30 DIAGNOSIS — I5021 Acute systolic (congestive) heart failure: Secondary | ICD-10-CM

## 2019-09-30 DIAGNOSIS — F329 Major depressive disorder, single episode, unspecified: Secondary | ICD-10-CM | POA: Diagnosis present

## 2019-09-30 DIAGNOSIS — I5082 Biventricular heart failure: Secondary | ICD-10-CM | POA: Diagnosis present

## 2019-09-30 DIAGNOSIS — I248 Other forms of acute ischemic heart disease: Secondary | ICD-10-CM | POA: Diagnosis present

## 2019-09-30 DIAGNOSIS — R109 Unspecified abdominal pain: Secondary | ICD-10-CM | POA: Diagnosis present

## 2019-09-30 DIAGNOSIS — G4733 Obstructive sleep apnea (adult) (pediatric): Secondary | ICD-10-CM | POA: Diagnosis present

## 2019-09-30 DIAGNOSIS — R791 Abnormal coagulation profile: Secondary | ICD-10-CM | POA: Diagnosis present

## 2019-09-30 DIAGNOSIS — E1165 Type 2 diabetes mellitus with hyperglycemia: Secondary | ICD-10-CM | POA: Diagnosis present

## 2019-09-30 DIAGNOSIS — I160 Hypertensive urgency: Secondary | ICD-10-CM | POA: Diagnosis present

## 2019-09-30 HISTORY — DX: Other specified abnormal findings of blood chemistry: R79.89

## 2019-09-30 HISTORY — DX: Hypertensive emergency: I16.1

## 2019-09-30 HISTORY — DX: Other specified abnormalities of plasma proteins: R77.8

## 2019-09-30 LAB — HEPATIC FUNCTION PANEL
ALT: 43 U/L (ref 0–44)
AST: 55 U/L — ABNORMAL HIGH (ref 15–41)
Albumin: 2.7 g/dL — ABNORMAL LOW (ref 3.5–5.0)
Alkaline Phosphatase: 152 U/L — ABNORMAL HIGH (ref 38–126)
Bilirubin, Direct: 0.7 mg/dL — ABNORMAL HIGH (ref 0.0–0.2)
Indirect Bilirubin: 1.6 mg/dL — ABNORMAL HIGH (ref 0.3–0.9)
Total Bilirubin: 2.3 mg/dL — ABNORMAL HIGH (ref 0.3–1.2)
Total Protein: 7 g/dL (ref 6.5–8.1)

## 2019-09-30 LAB — TROPONIN I (HIGH SENSITIVITY)
Troponin I (High Sensitivity): 100 ng/L (ref <18)
Troponin I (High Sensitivity): 76 ng/L — ABNORMAL HIGH (ref <18)
Troponin I (High Sensitivity): 83 ng/L — ABNORMAL HIGH (ref <18)

## 2019-09-30 LAB — GLUCOSE, CAPILLARY
Glucose-Capillary: 346 mg/dL — ABNORMAL HIGH (ref 70–99)
Glucose-Capillary: 322 mg/dL — ABNORMAL HIGH (ref 70–99)
Glucose-Capillary: 132 mg/dL — ABNORMAL HIGH (ref 70–99)
Glucose-Capillary: 186 mg/dL — ABNORMAL HIGH (ref 70–99)

## 2019-09-30 LAB — HEMOGLOBIN A1C
Hgb A1c MFr Bld: 11.1 % — ABNORMAL HIGH (ref 4.8–5.6)
Mean Plasma Glucose: 271.87 mg/dL

## 2019-09-30 LAB — ECHOCARDIOGRAM COMPLETE
Height: 59 in
Weight: 3227.53 oz

## 2019-09-30 LAB — PROTIME-INR
INR: 1.3 — ABNORMAL HIGH (ref 0.8–1.2)
Prothrombin Time: 15.7 seconds — ABNORMAL HIGH (ref 11.4–15.2)

## 2019-09-30 LAB — SARS CORONAVIRUS 2 (TAT 6-24 HRS): SARS Coronavirus 2: NEGATIVE

## 2019-09-30 MED ORDER — DIPHENHYDRAMINE HCL 25 MG PO CAPS
50.0000 mg | ORAL_CAPSULE | Freq: Once | ORAL | Status: AC
  Administered 2019-09-30: 50 mg via ORAL
  Filled 2019-09-30: qty 2

## 2019-09-30 MED ORDER — SODIUM CHLORIDE 0.9% FLUSH: 3.0000 mL | INTRAVENOUS | Status: DC | PRN

## 2019-09-30 MED ORDER — INSULIN GLARGINE 100 UNIT/ML ~~LOC~~ SOLN
18.0000 [IU] | SUBCUTANEOUS | Status: DC
  Administered 2019-09-30: 18 [IU] via SUBCUTANEOUS
  Filled 2019-09-30 (×2): qty 0.18

## 2019-09-30 MED ORDER — SACUBITRIL-VALSARTAN 49-51 MG PO TABS
1.0000 | ORAL_TABLET | Freq: Two times a day (BID) | ORAL | Status: DC
  Administered 2019-09-30 – 2019-10-04 (×9): 1 via ORAL
  Filled 2019-09-30 (×11): qty 1

## 2019-09-30 MED ORDER — ACETAMINOPHEN 325 MG PO TABS
650.0000 mg | ORAL_TABLET | ORAL | Status: DC | PRN
  Administered 2019-10-03: 650 mg via ORAL
  Filled 2019-09-30: qty 2

## 2019-09-30 MED ORDER — MECLIZINE HCL 25 MG PO TABS: 12.5000 mg | ORAL_TABLET | Freq: Three times a day (TID) | ORAL | Status: DC | PRN

## 2019-09-30 MED ORDER — LABETALOL HCL 5 MG/ML IV SOLN
10.0000 mg | INTRAVENOUS | Status: DC | PRN
  Administered 2019-09-30: 20 mg via INTRAVENOUS
  Filled 2019-09-30: qty 4

## 2019-09-30 MED ORDER — LABETALOL HCL 5 MG/ML IV SOLN: 10.0000 mg | INTRAVENOUS | Status: DC | PRN

## 2019-09-30 MED ORDER — WARFARIN - PHARMACIST DOSING INPATIENT: Freq: Every day | Status: DC

## 2019-09-30 MED ORDER — CARVEDILOL 12.5 MG PO TABS
12.5000 mg | ORAL_TABLET | Freq: Two times a day (BID) | ORAL | Status: DC
  Administered 2019-09-30 – 2019-10-04 (×10): 12.5 mg via ORAL
  Filled 2019-09-30 (×11): qty 1

## 2019-09-30 MED ORDER — SPIRONOLACTONE 25 MG PO TABS
25.0000 mg | ORAL_TABLET | Freq: Every day | ORAL | Status: DC
  Administered 2019-09-30 – 2019-10-04 (×5): 25 mg via ORAL
  Filled 2019-09-30 (×6): qty 1

## 2019-09-30 MED ORDER — ALBUTEROL SULFATE (2.5 MG/3ML) 0.083% IN NEBU: 3.0000 mL | INHALATION_SOLUTION | Freq: Four times a day (QID) | RESPIRATORY_TRACT | Status: DC | PRN

## 2019-09-30 MED ORDER — ATORVASTATIN CALCIUM 40 MG PO TABS
40.0000 mg | ORAL_TABLET | Freq: Every day | ORAL | Status: DC
  Administered 2019-09-30 – 2019-10-03 (×4): 40 mg via ORAL
  Filled 2019-09-30 (×4): qty 1

## 2019-09-30 MED ORDER — SODIUM CHLORIDE 0.9% FLUSH
3.0000 mL | Freq: Two times a day (BID) | INTRAVENOUS | Status: DC
  Administered 2019-09-30 – 2019-10-04 (×9): 3 mL via INTRAVENOUS

## 2019-09-30 MED ORDER — TRAZODONE HCL 50 MG PO TABS
50.0000 mg | ORAL_TABLET | Freq: Every evening | ORAL | Status: DC | PRN
  Administered 2019-09-30 – 2019-10-03 (×4): 50 mg via ORAL
  Filled 2019-09-30 (×4): qty 1

## 2019-09-30 MED ORDER — INSULIN ASPART 100 UNIT/ML ~~LOC~~ SOLN
0.0000 [IU] | Freq: Three times a day (TID) | SUBCUTANEOUS | Status: DC
  Administered 2019-09-30: 15 [IU] via SUBCUTANEOUS
  Administered 2019-09-30: 11 [IU] via SUBCUTANEOUS
  Administered 2019-09-30: 2 [IU] via SUBCUTANEOUS
  Administered 2019-10-01: 15 [IU] via SUBCUTANEOUS
  Administered 2019-10-01: 3 [IU] via SUBCUTANEOUS
  Administered 2019-10-01: 5 [IU] via SUBCUTANEOUS
  Administered 2019-10-02: 8 [IU] via SUBCUTANEOUS
  Administered 2019-10-02 (×2): 3 [IU] via SUBCUTANEOUS
  Administered 2019-10-03: 5 [IU] via SUBCUTANEOUS
  Administered 2019-10-03: 2 [IU] via SUBCUTANEOUS
  Administered 2019-10-03 – 2019-10-04 (×2): 5 [IU] via SUBCUTANEOUS
  Administered 2019-10-04: 2 [IU] via SUBCUTANEOUS

## 2019-09-30 MED ORDER — DIGOXIN 125 MCG PO TABS
0.1250 mg | ORAL_TABLET | Freq: Every day | ORAL | Status: DC
  Administered 2019-09-30 – 2019-10-04 (×5): 0.125 mg via ORAL
  Filled 2019-09-30 (×5): qty 1

## 2019-09-30 MED ORDER — SODIUM CHLORIDE 0.9 % IV SOLN: 250.0000 mL | INTRAVENOUS | Status: DC | PRN

## 2019-09-30 MED ORDER — IOHEXOL 350 MG/ML SOLN
100.0000 mL | Freq: Once | INTRAVENOUS | Status: AC | PRN
  Administered 2019-09-30: 100 mL via INTRAVENOUS

## 2019-09-30 MED ORDER — INSULIN ASPART 100 UNIT/ML ~~LOC~~ SOLN
0.0000 [IU] | Freq: Every day | SUBCUTANEOUS | Status: DC
  Administered 2019-10-01: 2 [IU] via SUBCUTANEOUS

## 2019-09-30 MED ORDER — TRAMADOL HCL 50 MG PO TABS
50.0000 mg | ORAL_TABLET | Freq: Two times a day (BID) | ORAL | Status: DC | PRN
  Administered 2019-09-30 – 2019-10-03 (×5): 50 mg via ORAL
  Filled 2019-09-30 (×6): qty 1

## 2019-09-30 MED ORDER — FUROSEMIDE 10 MG/ML IJ SOLN: 40.0000 mg | Freq: Every day | INTRAMUSCULAR | Status: DC

## 2019-09-30 MED ORDER — GABAPENTIN 100 MG PO CAPS
100.0000 mg | ORAL_CAPSULE | Freq: Three times a day (TID) | ORAL | Status: DC
  Administered 2019-09-30 – 2019-10-04 (×13): 100 mg via ORAL
  Filled 2019-09-30 (×13): qty 1

## 2019-09-30 MED ORDER — ONDANSETRON HCL 4 MG/2ML IJ SOLN: 4.0000 mg | Freq: Four times a day (QID) | INTRAMUSCULAR | Status: DC | PRN

## 2019-09-30 MED ORDER — SODIUM CHLORIDE (PF) 0.9 % IJ SOLN
INTRAMUSCULAR | Status: AC
  Administered 2019-09-30: 10 mL
  Filled 2019-09-30: qty 50

## 2019-09-30 MED ORDER — WARFARIN SODIUM 5 MG PO TABS
10.0000 mg | ORAL_TABLET | Freq: Once | ORAL | Status: AC
  Administered 2019-09-30: 10 mg via ORAL
  Filled 2019-09-30: qty 2
  Filled 2019-09-30: qty 1

## 2019-09-30 MED ORDER — METHOCARBAMOL 500 MG PO TABS: 500.0000 mg | ORAL_TABLET | Freq: Three times a day (TID) | ORAL | Status: DC | PRN

## 2019-09-30 MED ORDER — FUROSEMIDE 10 MG/ML IJ SOLN
80.0000 mg | Freq: Two times a day (BID) | INTRAMUSCULAR | Status: DC
  Administered 2019-09-30 – 2019-10-03 (×7): 80 mg via INTRAVENOUS
  Filled 2019-09-30 (×7): qty 8

## 2019-09-30 NOTE — ED Notes (Signed)
Pt transported to CT ?

## 2019-09-30 NOTE — Consult Note (Addendum)
Cardiology Consultation:   Patient ID: [name] MRN: [id number]; DOB: [birth year]  Admit date: 09/29/2019 Date of Consult: 09/30/2019  Primary Care Provider: [name], DO Primary Cardiologist: Loralie Champagne, MD  Primary Electrophysiologist:  Will Meredith Leeds, MD    Patient Profile:   [name] is a 44 y.o. female with a history of no significant CAD on cardiac catheterization in 0/2637, chronic systolic CHF/non-ischemic cardiomyopathy with EF of 20-25% on Echo in 03/2019, paroxysmal atrial fibrillation on Coumadin, poorly controlled hypertension, hyperlipidemia, poorly controlled type 2 diabetes mellitus, and morbid obesity who is being seen today for the evaluation of acute on chronic CHF at the request of Dr. Alcario Drought.  History of Present Illness:   [name] is a 44 year old female with the above history who is followed by Dr. Aundra Dubin. Patient's PCP ordered outpatient Echo in 12/2018 for further evaluation of dyspnea on exertion and this showed LVEF of 20-25% with moderately reduced RV systolic function as well. She was also noted to be in atrial fibrillation at this time. Therefore, patient was started on Xarelto and referred to [facility] for further evaluation. She was seen by Oda Kilts, PA-C, on 01/01/2019 at which time she was noted to be back in sinus rhythm. Cardiomyopathy was felt to likely be tachy induced. Her HCTZ and Lisinopril were stopped and she was started on Entresto. Coreg was also increased. She was admitted in 02/2019 with CHF exacerbation. She was diuresed and discharged on Lasix 75m twice daily. After getting home from this admission, she progressively gained about 15 lbs. Lasix was increased to 879mtwice daily with no improvement. She was readmitted from 03/27/2019 to 04/01/2019 for acute on chronic CHF. Repeat Echo during this admission continued to show LVEF of 20-25% with diffuse hypokinesis and grade 3 diastolic dysfunction. She was also noted to have a  apical thrombus. She underwent right/left heart catheterization which showed no obstructive CAD as R > L heart failure with low PAPI suggesting significant RV dysfunction. She was transitioned to PO Torsemide and treated with guideline directed medical therapy with Entresto, Spironolactone, Coreg, and Digoxin. She was also transitioned to Coumadin given apical thrombus. [facility] at which time she reported stable NYHA class II-III symptoms but no symptoms at rest. Entresto and Coreg were increased at this visit and Torsemide was reduced to M/W/F. She was supposed to follow-up with PharmD in 2 weeks but it does not look like this happened.  Since her last visit, she has had multiple ED visit for chest pain and shortness of breath. She presented to the ED on 09/12/2019 with worsening dyspnea on exertion and chest pain as well as 10-15 lb weight gain. BNP elevated at 649.9 and high-sensitivity troponin minimally elevated at 20 >> 25. She was given a dose of IV Lasix and discharged. She was seen again in the ED on 09/25/2019 again for chest pain and shortness of breath. She reported compliance with her medications. BNP elevated at 227 and high-sensitivity troponin minimally elevated and flat at 23 >> 23. She was again given a dose of IV Lasix with significant improvement symptoms and was therefore discharged.  [facility]. Patient reports continuous decline since last visit. She has continued to have chest pain and shortness of breath with activity but also at rest. She describes the chest pain as a sharp pain as well as a  tightness. She also report orthopnea and PND and states she has been sleeping on her knees because it is hard for her to breathe. She notes worsening lower extremity edema and a 40 lb weight gain in about the last month. She states she got down to 165 lbs and weighs 201 lbs today. She  also notes some palpitations and dizziness at times but denies any falls or syncope. She has had nausea and vomiting over the last month but no fevers. No abnormal bleeding on Couamdin. She has felt incredibly fatigued. She also notes feeling depressed. She denies any concrete suicidal ideation but states she has prayed to God that she does not want to wake up. Patient states she watches her sodium and fluid intake closely. She initially states that she was compliant with all her medications but then admitted that she missed several doses about 1 month ago due to a family emergency and then has been out of some of her medications for the past week. She could not tell me which medications she was out of. She also notes some transportation issues that make it difficult to go and pick up prescriptions.   In the ED, patient hypertensive with BP as high as the 180's/140's, tachypneic, tachycardic, an dhypoxic with O2 sats in the 80's. EKG showed no acute ischemic changes. High sensitivity troponin minimally elevated at 76 >> 100 >> 83. BNP elevated at 562.9. Chest x-ray showed stable cardiomegaly and mild central pulmonary vascular congestion but no overt edema. Chest CTA negative for PE but did note some contrast reflux into the hepatic vein and IVC raising the possibility of right heart insufficiency.WBC 11.4, Hgb 12.0, Plts 400. Na 130, K 4.7, Glucose 395, BUN 19, Cr 0.93. Patient was started on IV Lasix and admitted for further management of acute on chronic CHF.   At the time of this evaluation, patient very drowsy but able to be aroused to answer questions. She seems very discouraged and notes no significant improvement with dose of IV Lasix 9m in the ED.   Heart Pathway Score:     Past Medical History:  Diagnosis Date   Abnormal Pap smear    Acute on chronic combined systolic and diastolic CHF (congestive heart failure) ([place] 02/19/2019   Acute on chronic congestive heart failure (HCC)    Anemia     Asthma    Atrial fibrillation ([place]    Diabetes mellitus    DKA, type 2 ([place] 02/06/2019   Heart failure with reduced ejection fraction ([place]    Hypertension    Major depressive disorder 02/08/2012   Reports that 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     Migraines    Morbid obesity ([place] 02/06/2019   Obstructive sleep apnea 06/30/2015   Polycystic ovarian disease     Past Surgical History:  Procedure Laterality Date   CERVIX LESION DESTRUCTION     CESAREAN SECTION     RIGHT/LEFT HEART CATH AND CORONARY ANGIOGRAPHY N/A 03/29/2019   Procedure: RIGHT/LEFT HEART CATH AND CORONARY ANGIOGRAPHY;  Surgeon: MLarey Dresser MD;  [facility];  Service: Cardiovascular;  Laterality: N/A;     Home Medications:  Prior to Admission medications   Medication Sig Start Date End Date Taking? Authorizing Provider  albuterol (VENTOLIN HFA) 108 (90 Base) MCG/ACT inhaler Inhale 2 puffs into the lungs every 6 (six) hours as needed for wheezing or shortness of breath. 09/04/19  Yes [name], DO  atorvastatin (LIPITOR) 40 MG tablet Take 1 tablet (40 mg total) by mouth daily at 6 PM. 04/02/19  Yes Larey Dresser, MD  carvedilol (COREG) 6.25 MG tablet Take 2 tablets (12.5 mg total) by mouth 2 (two) times daily with a meal. 06/17/19  Yes Lyda Jester M, PA-C  digoxin (LANOXIN) 0.125 MG tablet Take 1 tablet (0.125 mg total) by mouth daily. 04/03/19  Yes Larey Dresser, MD  empagliflozin (JARDIANCE) 25 MG TABS tablet Take 25 mg by mouth daily. 07/31/19  Yes Bland, Scott, DO  gabapentin (NEURONTIN) 100 MG capsule Take 1 capsule (100 mg total) by mouth every 8 (eight) hours. 04/02/19  Yes Winfrey, Alcario Drought, MD  meclizine (ANTIVERT) 12.5 MG tablet Take 1 tablet (12.5 mg total) by mouth 3 (three) times daily as needed for dizziness. 09/19/19  Yes [name], DO  methocarbamol (ROBAXIN) 500 MG tablet Take 1 tablet (500 mg total) by mouth every  8 (eight) hours as needed for muscle spasms. 07/13/19  Yes Bland, Scott, DO  miconazole (MICONAZOLE 7) 100 MG vaginal suppository Place 1 suppository (100 mg total) vaginally at bedtime. 08/09/19  Yes [name], DO  potassium chloride SA (KLOR-CON) 20 MEQ tablet Take 1 tablet every Monday, Wednesday, and Friday. 06/17/19  Yes Simmons, Brittainy M, PA-C  sacubitril-valsartan (ENTRESTO) 49-51 MG Take 1 tablet by mouth 2 (two) times daily. 06/17/19  Yes Lyda Jester M, PA-C  terconazole (TERAZOL 3) 0.8 % vaginal cream Place 1 applicator vaginally at bedtime. 08/09/19  Yes [name], DO  torsemide (DEMADEX) 20 MG tablet Take 51m on Monday, Wednesday, and Friday. 06/17/19  Yes Simmons, Brittainy M, PA-C  traZODone (DESYREL) 50 MG tablet Take 1 tablet (50 mg total) by mouth at bedtime as needed. for sleep 08/22/19  Yes RRory Percy DO  warfarin (COUMADIN) 7.5 MG tablet Take 1 tablet (7.5 mg total) by mouth daily at 6 PM. 04/02/19  Yes MLarey Dresser MD  Accu-Chek Softclix Lancets lancets Use as instructed 02/12/19   ACaroline More DO  Accu-Chek Softclix Lancets lancets Check cbg 2 times daily 08/16/19   BSherene Sires DO  Blood Glucose Monitoring Suppl (ACCU-CHEK AVIVA PLUS) w/Device KIT 1 application by Does not apply route QID. Check fasting sugars as well as throughout the day 02/12/19   ACaroline More DO  glucose blood (ACCU-CHEK AVIVA PLUS) test strip Check CBG 2 times per day 08/16/19   BSherene Sires DO  glucose blood (AGAMATRIX PRESTO TEST) test strip Use as instructed 10/23/17   HZenia Resides MD  Lancets Misc. (ACCU-CHEK SOFTCLIX LANCET DEV) KIT 1 application by Does not apply route 2 (two) times daily. 08/16/19   BSherene Sires DO  spironolactone (ALDACTONE) 25 MG tablet Take 1 tablet (25 mg total) by mouth daily. 04/03/19 08/01/19  MLarey Dresser MD    Inpatient Medications: Scheduled Meds:  atorvastatin  40 mg Oral q1800   carvedilol  12.5 mg Oral BID WC   digoxin  0.125 mg  Oral Daily   furosemide  80 mg Intravenous BID   gabapentin  100 mg Oral Q8H   insulin aspart  0-15 Units Subcutaneous TID WC   insulin aspart  0-5 Units Subcutaneous QHS   sacubitril-valsartan  1 tablet Oral BID   sodium chloride flush  3 mL Intravenous Q12H   spironolactone  25 mg Oral Daily   warfarin  10 mg Oral Once   Warfarin - Pharmacist Dosing Inpatient   Does not apply  q1800   Continuous Infusions:  sodium chloride     PRN Meds: sodium chloride, acetaminophen, albuterol, labetalol, meclizine, methocarbamol, ondansetron (ZOFRAN) IV, sodium chloride flush, traZODone  Allergies:    Allergies  Allergen Reactions   Shellfish Allergy Swelling    Airway involvement including EMS - Has Epi-Pen   Dulaglutide Nausea And Vomiting and Other (See Comments)    Multiple episodes of vomiting over multiple days   Metformin And Related Diarrhea    Social History:   Social History   Socioeconomic History   Marital status: Single    Spouse name: Not on file   Number of children: Not on file   Years of education: Not on file   Highest education level: Not on file  Occupational History   Not on file  Tobacco Use   Smoking status: Never Smoker   Smokeless tobacco: Never Used  Substance and Sexual Activity   Alcohol use: No    Alcohol/week: 0.0 standard drinks   Drug use: No   Sexual activity: Yes  Other Topics Concern   Not on file  Social History Narrative   Not on file   Social Determinants of Health   Financial Resource Strain:    Difficulty of Paying Living Expenses: Not on file  Food Insecurity:    Worried About Running Out of Food in the Last Year: Not on file   Ran Out of Food in the Last Year: Not on file  Transportation Needs:    Lack of Transportation (Medical): Not on file   Lack of Transportation (Non-Medical): Not on file  Physical Activity:    Days of Exercise per Week: Not on file   Minutes of Exercise per Session: Not on file  Stress:    Feeling of  Stress : Not on file  Social Connections:    Frequency of Communication with Friends and Family: Not on file   Frequency of Social Gatherings with Friends and Family: Not on file   Attends Religious Services: Not on file   Active Member of Clubs or Organizations: Not on file   Attends Archivist Meetings: Not on file   Marital Status: Not on file  Intimate Partner Violence:    Fear of Current or Ex-Partner: Not on file   Emotionally Abused: Not on file   Physically Abused: Not on file   Sexually Abused: Not on file    Family History:    Family History  Problem Relation Age of Onset   Diabetes Mother    Hypertension Mother    Hypertension Father    Diabetes Father      ROS:  Please see the history of present illness.  All other ROS reviewed and negative.     Physical Exam/Data:   Vitals:   09/30/19 0500 09/30/19 0600 09/30/19 0656 09/30/19 0659  BP: (!) 146/117 (!) 135/108 116/89   Pulse: 83 80 81   Resp: 18 10 (!) 24   Temp:    (!) 97 F (36.1 C)  TempSrc:    Rectal  SpO2: 99% 100% 100%   Weight:  91.5 kg    Height:  '4\' 11"'  (1.499 m)     No intake or output data in the 24 hours ending 09/30/19 0831 Last 3 Weights 09/30/2019 09/29/2019 09/24/2019  Weight (lbs) 201 lb 11.5 oz 220 lb 7.4 oz 220 lb 7.4 oz  Weight (kg) 91.5 kg 100 kg 100 kg     Body mass index is 40.74 kg/m.  General:  44 y.o. morbidly obese African-American female resting comfortably. HEENT: Normocephalic and atraumatic.  Neck: Supple. No carotid bruits. JVD elevated to jaw line. Heart: RRR. Distinct S1 and S2. No significant murmurs, gallops, or rubs appreciated. Radial and distal pedal pulses 2+ and equal bilaterally. Lungs: No increased work of breathing. Clear to ausculation bilaterally. No wheezes, rhonchi, or rales.  Abdomen: Soft, non-distended, and non-tender to palpation. Bowel sounds present in all 4 quadrants.  Extremities: 1+ pitting edema of bilateral lower extremities.    Skin:  Warm and dry. Neuro: Alert and oriented x3. No focal deficits. Psych: Normal affect. Responds appropriately.   EKG:  The EKG was personally reviewed and demonstrates: Sinus tachycardia, rate 110 bpm, with probable left atrial enlargement and diffuse T wave flattening. Telemetry:  Telemetry was personally reviewed and demonstrates:  Normal sinus rhythm with rates in the 70's to 80's.  Relevant CV Studies:  Echocardiogram 12/31/2018: 1. The left ventricle has severely reduced systolic function, with an  ejection fraction of 20-25%. The cavity size was normal. Left ventricular  diastolic function could not be evaluated secondary to atrial  fibrillation.   2. The average left ventricular global longitudinal strain is severely  abnormal at 5.7 %.   3. The right ventricle has moderately reduced systolic function. The  cavity was normal. There is no increase in right ventricular wall  thickness. Right ventricular systolic pressure could not be assessed.   4. The inferior vena cava was normal in size with <50% respiratory  variability.  _______________  Echocardiogram 03/28/2019: 1. The left ventricle has severely reduced systolic function, with an  ejection fraction of 20-25%. The cavity size was mildly dilated. There is  mildly increased left ventricular wall thickness. Left ventricular  diastolic Doppler parameters are  consistent with restrictive filling. Left ventricular diffuse hypokinesis.  1.4 x 1.3 cm apical thrombus noted.   2. No evidence of mitral valve stenosis. Mild mitral regurgitation.   3. The aortic root is normal in size and structure.   4. Tricuspid valve regurgitation is moderate.   5. The aortic valve is tricuspid. No stenosis of the aortic valve.   6. Left atrial size was mildly dilated.   7. The right ventricle has moderately reduced systolic function. The  cavity was normal. There is no increase in right ventricular wall  thickness.   8. The inferior vena cava was  dilated in size with <50% respiratory  variability. PA systolic pressure 37 mmHg.  _______________  Right/Left Cardiac Catheterization 03/29/2019: 1. No obstructive CAD, nonischemic cardiomyopathy.  2. R > L heart failure with low PAPI suggesting significant RV dysfunction.  3. Primarily pulmonary venous hypertension.  4. Low cardiac output, CI 2.07.    Patient will need ongoing diuresis, will start digoxin and Lasix gtt.  She will need to start warfarin with heparin bridge.   Laboratory Data:  High Sensitivity Troponin:   Recent Labs  Lab 09/24/19 2238 09/25/19 0035 09/30/19 0018 09/30/19 0159 09/30/19 0600  TROPONINIHS 23* 23* 76* 100* 83*     Chemistry Recent Labs  Lab 09/24/19 2238 09/29/19 2109  NA 132* 130*  K 4.1 4.7  CL 99 95*  CO2 23 24  GLUCOSE 396* 395*  BUN 12 19  CREATININE 0.86 0.93  CALCIUM 8.9 9.0  GFRNONAA >60 >60  GFRAA >60 >60  ANIONGAP 10 11    No results for input(s): PROT, ALBUMIN, AST, ALT, ALKPHOS, BILITOT in the last 168 hours. Hematology Recent Labs  Lab 09/24/19 2238 09/29/19  2109  WBC 9.8 11.4*  RBC 4.54 4.94  HGB 11.4* 12.0  HCT 35.9* 38.6  MCV 79.1* 78.1*  MCH 25.1* 24.3*  MCHC 31.8 31.1  RDW 13.8 14.1  PLT 359 400   BNP Recent Labs  Lab 09/25/19 0148 09/29/19 2109  BNP 277.8* 562.9*    DDimer No results for input(s): DDIMER in the last 168 hours.   Radiology/Studies:  DG Chest 2 View  Result Date: 09/29/2019 CLINICAL DATA:  Shortness of breath and chest pain for 1 month EXAM: CHEST - 2 VIEW COMPARISON:  Radiograph 09/24/2019 FINDINGS: Cardiomegaly is similar to comparison exams. Stable area of scarring in the left mid lung. There is mild central pulmonary vascular congestion without overt edema. No pneumothorax or effusion. No acute osseous or soft tissue abnormality. Telemetry leads overlie the chest. IMPRESSION: Stable cardiomegaly and mild central pulmonary vascular congestion without overt edema. Electronically  Signed   By: Lovena Le M.D.   On: 09/29/2019 20:58   CT Angio Chest PE W and/or Wo Contrast  Result Date: 09/30/2019 CLINICAL DATA:  44 year old female with chest pain and shortness of breath for 1 month. EXAM: CT ANGIOGRAPHY CHEST WITH CONTRAST TECHNIQUE: Multidetector CT imaging of the chest was performed using the standard protocol during bolus administration of intravenous contrast. Multiplanar CT image reconstructions and MIPs were obtained to evaluate the vascular anatomy. CONTRAST:  169m OMNIPAQUE IOHEXOL 350 MG/ML SOLN COMPARISON:  Chest radiographs 09/29/2019 and earlier. FINDINGS: Cardiovascular: Excellent contrast bolus timing in the pulmonary arterial tree. Mild lower lobe respiratory motion. No focal filling defect identified in the pulmonary arteries to suggest acute pulmonary embolism. Mild cardiomegaly (series 4, image 72). No pericardial effusion. There is some contrast reflux into the IVC and hepatic veins. No contrast in the aorta. No calcified coronary artery atherosclerosis is evident. Mediastinum/Nodes: Negative. Lungs/Pleura: Major airways are patent. There is a small layering right pleural effusion. Mild associated lower lobe opacity most resembles atelectasis. There is also platelike atelectasis in the left lower lobe and lingula. Upper Abdomen: Negative visible liver, spleen, pancreas, adrenal glands, kidneys and bowel. Musculoskeletal: No acute osseous abnormality identified. Review of the MIP images confirms the above findings. IMPRESSION: 1. Negative for acute pulmonary embolus. 2. Cardiomegaly with some contrast reflux into the hepatic vein and IVC raising the possibility of right heart insufficiency. 3. Superimposed small layering right pleural effusion and mild pulmonary atelectasis. \ Electronically Signed   By: HGenevie AnnM.D.   On: 09/30/2019 02:12   TIMI Risk Score for Unstable Angina or Non-ST Elevation MI:   The patient's TIMI risk score is 3, which indicates a 13% risk  of all cause mortality, new or recurrent myocardial infarction or need for urgent revascularization in the next 14 days.   Assessment and Plan:   Acute on Chronic Combined CHF with Biventricular Failure (R > L) - Patient presents with worsening dyspnea on exertion, orthpnea, PND, lower extremity edema, and a 40 lb weight loss.  - BNP elevated at 562.9. - No overt edema on chest x-ray. - Most recent Echo from 03/2019 showed LVEF of 20-25% with diffuse hypokinesis and moderate TR. RV normal in size with moderately reduced systolic function. L/RHC at that time showed no significant CAD and low PAPI suggestive of significant RV dysfunction.  - Repeat Echo pending. - No significant crackles on exam but JVD elevated and lower extremity edema present.  - Patient started on IV Lasix 48mdaily. Will increase to 804mwice daily (which is what  she required during last admission in 03/2019). Weight 201 lbs today. She states dry weight around 165 lbs. Renal function stable.  - Continue Entresto 49-73m twice daily, Coreg 12.552mtwice daily, Spironolactone 252maily, and Digoxin 0.125m46mily. - Continue to monitor daily weights, strict I/O's, and renal function.  Demand Ischemia - Patient also reports worsening chest pain both at rest and with exertion. She describes chest pain as sharp pain and a tightness.  - High sensitivity-troponin minimally elevated at 76 >> 100 >> 83. - No acute changes on EKG. - LHC in 03/2019 showed no significant CAD. - Suspect demand ischemia secondary to acute on chronic CHF. No additional ischemic work-up needed at this time.  Paroxysmal Atrial Fibrillation - Maintaining sinus rhythm. Rate controlled. - Continue Coreg and Digoxin as above. - Continue chronic anticoagulation with Coumadin. INR subtherapeutic at 1.3. Dosing per Pharmacy.   Apical Thrombus - Echo in 03/2019 showed apical thrombus. - Continue Coumadin per Pharmacy.   Hypertension - BP markedly elevated on  arrival with BP as high as 195/156. Patient has been out of some of her medications for the last week but could not tell me which ones. Most recent BP 116/89. - Continue Entresto, Coreg, and Spironolactone as above.  Hyperlipidemia - Continue home Lipitor 40mg51mly.  Type 2 Diabetes Mellitus - Poorly Controlled - Hemoglobin A1c 11.1. - Management per primary team.  Nausea/Vomiting - Suspect secondary to RV failure/ possible hepatic congestion. - Will check hepatic function. - Continue with diuresis as above.    For questions or updates, please contact [facility] consult [email or web address] for contact info under     Signed, CalliEppie Gibson/2021 8:31 AM  Patient examined chart reviewed Discussed care with patient and PA. Exam with obese black female basilar crackles no murmur plus 1-2 LE edema. Primary issue is lack of compliance with meds For at least two weeks. Discussed importance of medication and fact she should call CHF clinic if she is having issues or cannot afford meds as they may be able to help. Meds re instituted along with Iv lasix Will likely need 3 days of diuresis and then can be followed with Dr McLeaAundra DubinHF cAnegamic as outpatient   PeterJenkins RougeACCS[facility]

## 2019-09-30 NOTE — Plan of Care (Signed)

## 2019-09-30 NOTE — Progress Notes (Signed)
[name] is a 44 y.o. female with medical history significant of HFrEF with 20-25% EF, PAF on coumadin, DM2, HTN.  Pt presents to ED with c/o CP and SOB.  Symptoms ongoing for past month.  [facility].  Prescribed QOD lasix, didn't take any today.   CTA chest shows R heart CHF.  BNP 562.9, Trops of 76 and 100.  09/30/19: Seen and examined.  Reports chest pain prior to presentation which is now resolved.  States she ran out of 1 of her cardiac medications, does not remember the name.  Admits to dyspnea with movement.  Ongoing diuresing and treatment for hypertensive emergency.  Cardiology following.  Please refer to H&P dictated by my partner Dr. Alcario Drought on 09/30/2019 for further details of the assessment and plan.

## 2019-09-30 NOTE — ED Provider Notes (Signed)
3:15 AM Patient care assumed from [place], [place] at shift change. In short, patient is a 44 y/o female with hx of CHF, Afib (on chronic coumadin), DM presenting to the ED for complaint of chest pain and SOB.  Recently in the emergency department for similar complaints. CTA pending at shift change. This has been reviewed and is negative for PE. It is suggestive of possible R sided heart failure.  Patient with elevated BNP today which may reflect CHF exacerbation. Patient also with uptrending troponin, increasing from 76 to 100. Her baseline troponin is usually elevated, but typically in the low to mid 20's. This change is favored to be related to demand related to uncontrolled CHF. Changes may also be related to hypertensive urgency given that BP has been elevated since arrival. Patient currently with sats of 100% on 2L via [place]. Noted to have borderline low oxygen saturations at rest on room air. Sats dropped to the upper 80's with ambulation.  Consult placed to [facility] Service for admission for continued management. Nightly carvedilol ordered as patient has not had this medication tonight.  3:20 AM Spoke with family medicine residency service as the patient is followed by family practice.  Resident reports that, as of April 2020, they do not accept patient transfers for admission.  Request admission to hospitalist service at [facility].  3:34 AM Case discussed with Dr. Alcario Drought who states patient needs to be admitted to family practice as he feels patient would need to go to [facility] anyway given her rising troponin levels.  3:38 AM Spoke with Dr. Charissa Bash of cardiology who has reviewed the patient's chart.  Patient had a reassuring cardiac catheterization in September 2020 without signs of obstructive CAD.  Agrees that troponin elevations are likely related to the patient's CHF.  Does not see reason why patient would require transfer to [facility] for additional cardiac intervention.   Feels it is reasonable for the patient to be admitted at [facility].  3:42 AM Dr. Alcario Drought updated on cardiology recommendations. Will assess patient in ED for admission.   Results for orders placed or performed during the hospital encounter of [id number]  Basic metabolic panel  Result Value Ref Range   Sodium 130 (L) 135 - 145 mmol/L   Potassium 4.7 3.5 - 5.1 mmol/L   Chloride 95 (L) 98 - 111 mmol/L   CO2 24 22 - 32 mmol/L   Glucose, Bld 395 (H) 70 - 99 mg/dL   BUN 19 6 - 20 mg/dL   Creatinine, Ser 0.93 0.44 - 1.00 mg/dL   Calcium 9.0 8.9 - 10.3 mg/dL   GFR calc non Af Amer >60 >60 mL/min   GFR calc Af Amer >60 >60 mL/min   Anion gap 11 5 - 15  Brain natriuretic peptide  Result Value Ref Range   B Natriuretic Peptide 562.9 (H) 0.0 - 100.0 pg/mL  CBC with Differential  Result Value Ref Range   WBC 11.4 (H) 4.0 - 10.5 K/uL   RBC 4.94 3.87 - 5.11 MIL/uL   Hemoglobin 12.0 12.0 - 15.0 g/dL   HCT 38.6 36.0 - 46.0 %   MCV 78.1 (L) 80.0 - 100.0 fL   MCH 24.3 (L) 26.0 - 34.0 pg   MCHC 31.1 30.0 - 36.0 g/dL   RDW 14.1 11.5 - 15.5 %   Platelets 400 150 - 400 K/uL   nRBC 0.0 0.0 - 0.2 %   Neutrophils Relative % 67 %   Neutro Abs 7.7 1.7 -  7.7 K/uL   Lymphocytes Relative 26 %   Lymphs Abs 3.0 0.7 - 4.0 K/uL   Monocytes Relative 6 %   Monocytes Absolute 0.7 0.1 - 1.0 K/uL   Eosinophils Relative 0 %   Eosinophils Absolute 0.0 0.0 - 0.5 K/uL   Basophils Relative 1 %   Basophils Absolute 0.1 0.0 - 0.1 K/uL   Immature Granulocytes 0 %   Abs Immature Granulocytes 0.04 0.00 - 0.07 K/uL  Troponin I (High Sensitivity)  Result Value Ref Range   Troponin I (High Sensitivity) 76 (H) <18 ng/L  Troponin I (High Sensitivity)  Result Value Ref Range   Troponin I (High Sensitivity) 100 (HH) <18 ng/L   DG Chest 2 View  Result Date: 09/29/2019 CLINICAL DATA:  Shortness of breath and chest pain for 1 month EXAM: CHEST - 2 VIEW COMPARISON:  Radiograph 09/24/2019 FINDINGS: Cardiomegaly is similar  to comparison exams. Stable area of scarring in the left mid lung. There is mild central pulmonary vascular congestion without overt edema. No pneumothorax or effusion. No acute osseous or soft tissue abnormality. Telemetry leads overlie the chest. IMPRESSION: Stable cardiomegaly and mild central pulmonary vascular congestion without overt edema. Electronically Signed   By: Lovena Le M.D.   On: 09/29/2019 20:58   DG Chest 2 View  Result Date: 09/24/2019 CLINICAL DATA:  Chest pain EXAM: CHEST - 2 VIEW COMPARISON:  09/12/2019, 03/27/2019, 02/06/2019 FINDINGS: Moderate cardiomegaly with slight central congestion but no pleural effusion or overt pulmonary edema. Linear atelectasis or scar in the left mid lung. No pneumothorax. IMPRESSION: Cardiomegaly with mild central congestion. Atelectasis or scarring in the left mid lung Electronically Signed   By: Donavan Foil M.D.   On: 09/24/2019 23:23   CT Angio Chest PE W and/or Wo Contrast  Result Date: 09/30/2019 CLINICAL DATA:  44 year old female with chest pain and shortness of breath for 1 month. EXAM: CT ANGIOGRAPHY CHEST WITH CONTRAST TECHNIQUE: Multidetector CT imaging of the chest was performed using the standard protocol during bolus administration of intravenous contrast. Multiplanar CT image reconstructions and MIPs were obtained to evaluate the vascular anatomy. CONTRAST:  12mL OMNIPAQUE IOHEXOL 350 MG/ML SOLN COMPARISON:  Chest radiographs 09/29/2019 and earlier. FINDINGS: Cardiovascular: Excellent contrast bolus timing in the pulmonary arterial tree. Mild lower lobe respiratory motion. No focal filling defect identified in the pulmonary arteries to suggest acute pulmonary embolism. Mild cardiomegaly (series 4, image 72). No pericardial effusion. There is some contrast reflux into the IVC and hepatic veins. No contrast in the aorta. No calcified coronary artery atherosclerosis is evident. Mediastinum/Nodes: Negative. Lungs/Pleura: Major airways are  patent. There is a small layering right pleural effusion. Mild associated lower lobe opacity most resembles atelectasis. There is also platelike atelectasis in the left lower lobe and lingula. Upper Abdomen: Negative visible liver, spleen, pancreas, adrenal glands, kidneys and bowel. Musculoskeletal: No acute osseous abnormality identified. Review of the MIP images confirms the above findings. IMPRESSION: 1. Negative for acute pulmonary embolus. 2. Cardiomegaly with some contrast reflux into the hepatic vein and IVC raising the possibility of right heart insufficiency. 3. Superimposed small layering right pleural effusion and mild pulmonary atelectasis. \ Electronically Signed   By: Genevie Ann M.D.   On: 09/30/2019 02:12   DG Chest Portable 1 View  Result Date: 09/12/2019 CLINICAL DATA:  Cough.  Shortness of breath. EXAM: PORTABLE CHEST 1 VIEW COMPARISON:  03/27/2019 FINDINGS: The heart size is enlarged. There are probable small bilateral pleural effusions with adjacent atelectasis. There  is vascular congestion. There is no focal infiltrate. IMPRESSION: Cardiomegaly with vascular congestion and possible small bilateral pleural effusions. Electronically Signed   By: Constance Holster M.D.   On: 09/12/2019 00:25      Antonietta Breach, PA-C 09/30/19 0342    Orpah Greek, MD 09/30/19 [phone]

## 2019-09-30 NOTE — Progress Notes (Signed)
ANTICOAGULATION CONSULT NOTE - Initial Consult  Pharmacy Consult for Warfarin Indication: atrial fibrillation  Allergies  Allergen Reactions  . Shellfish Allergy Swelling    Airway involvement including EMS - Has Epi-Pen  . Dulaglutide Nausea And Vomiting and Other (See Comments)    Multiple episodes of vomiting over multiple days  . Metformin And Related Diarrhea    Patient Measurements: Height: 4\' 11"  (149.9 cm) Weight: 220 lb 7.4 oz (100 kg) IBW/kg (Calculated) : 43.2   Vital Signs: Temp: 98 F (36.7 C) (03/07 1802) Temp Source: Oral (03/07 1802) BP: 184/126 (03/08 0300) Pulse Rate: 108 (03/08 0300)  Labs: Recent Labs    09/29/19 2109 09/30/19 0018 09/30/19 0159  HGB 12.0  --   --   HCT 38.6  --   --   PLT 400  --   --   CREATININE 0.93  --   --   TROPONINIHS  --  76* 100*    Estimated Creatinine Clearance: 82.8 mL/min (by C-G formula based on SCr of 0.93 mg/dL).   Medical History: Past Medical History:  Diagnosis Date  . Abnormal Pap smear   . Acute on chronic combined systolic and diastolic CHF (congestive heart failure) ([place]) 02/19/2019  . Acute on chronic congestive heart failure ([place])   . Anemia   . Asthma   . Atrial fibrillation ([place])   . Diabetes mellitus   . DKA, type 2 ([place]) 02/06/2019  . Heart failure with reduced ejection fraction ([place])   . Hypertension   . 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    . Migraines   . Morbid obesity ([place]) 02/06/2019  . Obstructive sleep apnea 06/30/2015  . Polycystic ovarian disease     Medications:  Scheduled:  . atorvastatin  40 mg Oral q1800  . carvedilol  12.5 mg Oral BID WC  . digoxin  0.125 mg Oral Daily  . furosemide  40 mg Intravenous Daily  . gabapentin  100 mg Oral Q8H  . sacubitril-valsartan  1 tablet Oral BID  . sodium chloride flush  3 mL Intravenous Q12H   Infusions:  . sodium  chloride      Assessment: 79 yoF with hx a-fib on chronic warfarin admitted with CP and SOB.  HD 7.5 mg daily. LD unk date/time INR = 1.3  Goal of Therapy:  INR 2-3    Plan:  Give warfarin 10 mg x1 today Daily INR  Lawana Pai R 09/30/2019,3:58 AM

## 2019-09-30 NOTE — Progress Notes (Addendum)
Inpatient Diabetes Program Recommendations  AACE/ADA: New Consensus Statement on Inpatient Glycemic Control (2015)  Target Ranges:  Prepandial:   less than 140 mg/dL      Peak postprandial:   less than 180 mg/dL (1-2 hours)      Critically ill patients:  140 - 180 mg/dL   Lab Results  Component Value Date   GLUCAP 148 (H) 04/02/2019   HGBA1C 11.1 (H) 09/30/2019    Review of Glycemic Control Results for TERREE, HALSELL (MRN [id number]) as of 09/30/2019 09:41  Ref. Range 09/29/2019 21:09  Glucose Latest Ref Range: 70 - 99 mg/dL 395 (H)   Diabetes history: DM  Outpatient Diabetes medications:  Jardiance 25 mg daily,  Current orders for Inpatient glycemic control:  Novolog moderate tid with meals and HS  Inpatient Diabetes Program Recommendations:    Please add Lantus 18 units daily while in the hospital. A1C indicates possible need for insulin at d/c.  Thanks,  Adah Perl, RN, BC-ADM Inpatient Diabetes Coordinator Pager [phone]- (8a-5p)  Addendum: Spoke with patient regarding elevated A1C.  She see's Pharm D- Pete Kovall at Dr. Perley Jain office.  She states that she tried Trulicity however it made her sick.  She has been on insulin in the past and is comfortable taking it again if needed.  Explained that insulin can be given using insulin pen and she states she is familiar with this from using the Trulicity.  Explained importance of glycemic control and patient verbalized understanding.

## 2019-09-30 NOTE — Progress Notes (Signed)
  Echocardiogram 2D Echocardiogram has been performed.  [name] 09/30/2019, 3:32 PM

## 2019-09-30 NOTE — H&P (Signed)
History and Physical    [name] ENI:[id number] DOB: 10/06/1975 DOA: 09/29/2019  PCP: Tammy Sires, DO  Patient coming from: Home  I have personally briefly reviewed patient's old medical records in [place]  Chief Complaint: CP, SOB  HPI: [name] is a 44 y.o. female with medical history significant of HFrEF with 20-25% EF, PAF on coumadin, DM2, HTN.  Pt presents to ED with c/o CP and SOB.  Symptoms ongoing for past month.  [facility].  Prescribed QOD lasix, didn't take any today.   ED Course: CTA chest shows R heart CHF.  BNP 562.9, Trops of 76 and 100.  Given Lasix 40  BP noted to be 190/130!  EDP spoke with cardiology who didn't feel she needed transfer to [facility] despite trop elevation.  Pt just had no occlusive CAD on heart cath a few months ago (Sept).   Review of Systems: As per HPI, otherwise all review of systems negative.  Past Medical History:  Diagnosis Date  . Abnormal Pap smear   . Acute on chronic combined systolic and diastolic CHF (congestive heart failure) ([place]) 02/19/2019  . Acute on chronic congestive heart failure ([place])   . Anemia   . Asthma   . Atrial fibrillation ([place])   . Diabetes mellitus   . DKA, type 2 ([place]) 02/06/2019  . Heart failure with reduced ejection fraction ([place])   . Hypertension   . 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    . Migraines   . Morbid obesity ([place]) 02/06/2019  . Obstructive sleep apnea 06/30/2015  . Polycystic ovarian disease     Past Surgical History:  Procedure Laterality Date  . CERVIX LESION DESTRUCTION    . CESAREAN SECTION    . RIGHT/LEFT HEART CATH AND CORONARY ANGIOGRAPHY N/A 03/29/2019   Procedure: RIGHT/LEFT HEART CATH AND CORONARY ANGIOGRAPHY;  Surgeon: Tammy Dresser, MD;  Location: [facility];  Service: Cardiovascular;  Laterality: N/A;     reports that she has never smoked. She has never used smokeless tobacco. She reports that she does not drink alcohol or use drugs.  Allergies  Allergen Reactions  . Shellfish Allergy Swelling    Airway involvement including EMS - Has Epi-Pen  . Dulaglutide Nausea And Vomiting and Other (See Comments)    Multiple episodes of vomiting over multiple days  . Metformin And Related Diarrhea    Family History  Problem Relation Age of Onset  . Diabetes Mother   . Hypertension Mother   . Hypertension Father   . Diabetes Father      Prior to Admission medications   Medication Sig Start Date End Date Taking? Authorizing Provider  albuterol (VENTOLIN HFA) 108 (90 Base) MCG/ACT inhaler Inhale 2 puffs into the lungs every 6 (six) hours as needed for wheezing or shortness of breath. 09/04/19  Yes Tammy Sires, DO  atorvastatin (LIPITOR) 40 MG tablet Take 1 tablet (40 mg total) by mouth daily at 6 PM. 04/02/19  Yes Tammy Dresser, MD  carvedilol (COREG) 6.25 MG tablet Take 2 tablets (12.5 mg total) by mouth 2 (two) times daily with a meal. 06/17/19  Yes [name] M, PA-C  digoxin (LANOXIN) 0.125 MG tablet Take 1 tablet (0.125 mg total) by mouth daily. 04/03/19  Yes Tammy Dresser, MD  empagliflozin (JARDIANCE) 25 MG TABS  tablet Take 25 mg by mouth daily. 07/31/19  Yes Bland, Scott, DO  gabapentin (NEURONTIN) 100 MG capsule Take 1 capsule (100 mg total) by mouth every 8 (eight) hours. 04/02/19  Yes Winfrey, Alcario Drought, MD  meclizine (ANTIVERT) 12.5 MG tablet Take 1 tablet (12.5 mg total) by mouth 3 (three) times daily as needed for dizziness. 09/19/19  Yes Tammy Sires, DO  methocarbamol (ROBAXIN) 500 MG tablet Take 1 tablet (500 mg total) by mouth every 8 (eight) hours as needed for muscle spasms. 07/13/19  Yes Bland, Scott, DO  miconazole (MICONAZOLE 7) 100 MG vaginal suppository Place 1 suppository (100 mg total) vaginally at bedtime. 08/09/19  Yes Tammy Sires, DO  potassium chloride SA (KLOR-CON)  20 MEQ tablet Take 1 tablet every Monday, Wednesday, and Friday. 06/17/19  Yes Simmons, Brittainy M, PA-C  sacubitril-valsartan (ENTRESTO) 49-51 MG Take 1 tablet by mouth 2 (two) times daily. 06/17/19  Yes [name] M, PA-C  terconazole (TERAZOL 3) 0.8 % vaginal cream Place 1 applicator vaginally at bedtime. 08/09/19  Yes Tammy Sires, DO  torsemide (DEMADEX) 20 MG tablet Take 98m on Monday, Wednesday, and Friday. 06/17/19  Yes Simmons, Brittainy M, PA-C  traZODone (DESYREL) 50 MG tablet Take 1 tablet (50 mg total) by mouth at bedtime as needed. for sleep 08/22/19  Yes RRory Percy DO  warfarin (COUMADIN) 7.5 MG tablet Take 1 tablet (7.5 mg total) by mouth daily at 6 PM. 04/02/19  Yes MLarey Dresser MD  Accu-Chek Softclix Lancets lancets Use as instructed 02/12/19   ACaroline More DO  Accu-Chek Softclix Lancets lancets Check cbg 2 times daily 08/16/19   BSherene Sires DO  Blood Glucose Monitoring Suppl (ACCU-CHEK AVIVA PLUS) w/Device KIT 1 application by Does not apply route QID. Check fasting sugars as well as throughout the day 02/12/19   ACaroline More DO  glucose blood (ACCU-CHEK AVIVA PLUS) test strip Check CBG 2 times per day 08/16/19   BSherene Sires DO  glucose blood (AGAMATRIX PRESTO TEST) test strip Use as instructed 10/23/17   HZenia Resides MD  Lancets Misc. (ACCU-CHEK SOFTCLIX LANCET DEV) KIT 1 application by Does not apply route 2 (two) times daily. 08/16/19   BSherene Sires DO  spironolactone (ALDACTONE) 25 MG tablet Take 1 tablet (25 mg total) by mouth daily. 04/03/19 08/01/19  MLarey Dresser MD    Physical Exam: Vitals:   09/30/19 0000 09/30/19 0100 09/30/19 0300 09/30/19 0400  BP: (!) 171/132 (!) 169/141 (!) 184/126 (!) 178/142  Pulse: 100 (!) 109 (!) 108 (!) 112  Resp: 14 (!) 7 (!) 5 18  Temp:      TempSrc:      SpO2: 96% 92% 92% 98%  Weight:      Height:        Constitutional: NAD, calm, comfortable Eyes: PERRL, lids and conjunctivae normal ENMT: Mucous  membranes are moist. Posterior pharynx clear of any exudate or lesions.Normal dentition.  Neck: normal, supple, no masses, no thyromegaly Respiratory: clear to auscultation bilaterally, no wheezing, no crackles. Normal respiratory effort. No accessory muscle use.  Cardiovascular: Regular rate and rhythm, no murmurs / rubs / gallops. No extremity edema. 2+ pedal pulses. No carotid bruits.  Abdomen: no tenderness, no masses palpated. No hepatosplenomegaly. Bowel sounds positive.  Musculoskeletal: no clubbing / cyanosis. No joint deformity upper and lower extremities. Good ROM, no contractures. Normal muscle tone.  Skin: no rashes, lesions, ulcers. No induration Neurologic: CN 2-12 grossly intact. Sensation intact, DTR normal. Strength 5/5 in  all 4.  Psychiatric: Normal judgment and insight. Alert and oriented x 3. Normal mood.    Labs on Admission: I have personally reviewed following labs and imaging studies  CBC: Recent Labs  Lab 09/24/19 2238 09/29/19 2109  WBC 9.8 11.4*  NEUTROABS  --  7.7  HGB 11.4* 12.0  HCT 35.9* 38.6  MCV 79.1* 78.1*  PLT 359 660   Basic Metabolic Panel: Recent Labs  Lab 09/24/19 2238 09/29/19 2109  NA 132* 130*  K 4.1 4.7  CL 99 95*  CO2 23 24  GLUCOSE 396* 395*  BUN 12 19  CREATININE 0.86 0.93  CALCIUM 8.9 9.0   GFR: Estimated Creatinine Clearance: 82.8 mL/min (by C-G formula based on SCr of 0.93 mg/dL). Liver Function Tests: No results for input(s): AST, ALT, ALKPHOS, BILITOT, PROT, ALBUMIN in the last 168 hours. No results for input(s): LIPASE, AMYLASE in the last 168 hours. No results for input(s): AMMONIA in the last 168 hours. Coagulation Profile: Recent Labs  Lab 09/24/19 2238  INR 1.1   Cardiac Enzymes: No results for input(s): CKTOTAL, CKMB, CKMBINDEX, TROPONINI in the last 168 hours. BNP (last 3 results) No results for input(s): PROBNP in the last 8760 hours. HbA1C: No results for input(s): HGBA1C in the last 72  hours. CBG: No results for input(s): GLUCAP in the last 168 hours. Lipid Profile: No results for input(s): CHOL, HDL, LDLCALC, TRIG, CHOLHDL, LDLDIRECT in the last 72 hours. Thyroid Function Tests: No results for input(s): TSH, T4TOTAL, FREET4, T3FREE, THYROIDAB in the last 72 hours. Anemia Panel: No results for input(s): VITAMINB12, FOLATE, FERRITIN, TIBC, IRON, RETICCTPCT in the last 72 hours. Urine analysis:    Component Value Date/Time   COLORURINE AMBER (A) 02/19/2019 2058   APPEARANCEUR HAZY (A) 02/19/2019 2058   LABSPEC 1.038 (H) 02/19/2019 2058   PHURINE 5.0 02/19/2019 2058   GLUCOSEU 150 (A) 02/19/2019 2058   HGBUR NEGATIVE 02/19/2019 2058   BILIRUBINUR SMALL (A) 02/19/2019 2058   BILIRUBINUR NEG 04/05/2013 1057   KETONESUR NEGATIVE 02/19/2019 2058   PROTEINUR >=300 (A) 02/19/2019 2058   UROBILINOGEN 0.2 08/09/2013 2031   NITRITE NEGATIVE 02/19/2019 2058   LEUKOCYTESUR NEGATIVE 02/19/2019 2058    Radiological Exams on Admission: DG Chest 2 View  Result Date: 09/29/2019 CLINICAL DATA:  Shortness of breath and chest pain for 1 month EXAM: CHEST - 2 VIEW COMPARISON:  Radiograph 09/24/2019 FINDINGS: Cardiomegaly is similar to comparison exams. Stable area of scarring in the left mid lung. There is mild central pulmonary vascular congestion without overt edema. No pneumothorax or effusion. No acute osseous or soft tissue abnormality. Telemetry leads overlie the chest. IMPRESSION: Stable cardiomegaly and mild central pulmonary vascular congestion without overt edema. Electronically Signed   By: Lovena Le [name].D.   On: 09/29/2019 20:58   CT Angio Chest PE W and/or Wo Contrast  Result Date: 09/30/2019 CLINICAL DATA:  44 year old female with chest pain and shortness of breath for 1 month. EXAM: CT ANGIOGRAPHY CHEST WITH CONTRAST TECHNIQUE: Multidetector CT imaging of the chest was performed using the standard protocol during bolus administration of intravenous contrast. Multiplanar CT  image reconstructions and MIPs were obtained to evaluate the vascular anatomy. CONTRAST:  180m OMNIPAQUE IOHEXOL 350 MG/ML SOLN COMPARISON:  Chest radiographs 09/29/2019 and earlier. FINDINGS: Cardiovascular: Excellent contrast bolus timing in the pulmonary arterial tree. Mild lower lobe respiratory motion. No focal filling defect identified in the pulmonary arteries to suggest acute pulmonary embolism. Mild cardiomegaly (series 4, image 72). No pericardial  effusion. There is some contrast reflux into the IVC and hepatic veins. No contrast in the aorta. No calcified coronary artery atherosclerosis is evident. Mediastinum/Nodes: Negative. Lungs/Pleura: Major airways are patent. There is a small layering right pleural effusion. Mild associated lower lobe opacity most resembles atelectasis. There is also platelike atelectasis in the left lower lobe and lingula. Upper Abdomen: Negative visible liver, spleen, pancreas, adrenal glands, kidneys and bowel. Musculoskeletal: No acute osseous abnormality identified. Review of the MIP images confirms the above findings. IMPRESSION: 1. Negative for acute pulmonary embolus. 2. Cardiomegaly with some contrast reflux into the hepatic vein and IVC raising the possibility of right heart insufficiency. 3. Superimposed small layering right pleural effusion and mild pulmonary atelectasis. \ Electronically Signed   By: Genevie Ann [name].D.   On: 09/30/2019 02:12    EKG: Independently reviewed.  Assessment/Plan Principal Problem:   Hypertensive urgency Active Problems:   DM2 (diabetes mellitus, type 2) (HCC)   HFrEF (heart failure with reduced ejection fraction) (HCC)   Paroxysmal atrial fibrillation (HCC)   Acute on chronic systolic CHF (congestive heart failure) (HCC)   Elevated troponin    1. HTN urgency - 1. PRN labetalol 2. Add PRN hydralazine if that fails 3. Resume all home BP meds 2. Acute on chronic systolic CHF - 1. CHF pathway 2. Tele monitor 3. Lasix 52m IV  daily 4. Strict intake and output 5. Daily BMP 6. Hold home torsemide 7. Resume home aldactone 8. Monitor serial trops 3. DM2 - 1. Hold jardiance 2. Mod scale SSI AC/HS 4. PAF - 1. Cont coreg 2. Cont digoxin 3. Cont coumadin  DVT prophylaxis: Coumadin Code Status: Full Family Communication: No family in room Disposition Plan: Home after admit Consults called: None Admission status: Place in o32   Macarena Langseth, JRouttHospitalists  How to contact the T[facility] LLCAttending or Consulting provider 7Acampoor covering provider during after hours 7[place] for this patient?  1. Check the care team in [facility] Paand look for a) attending/consulting TRH provider listed and b) the TMid [facility]ontteam listed 2. Log into [email or web address]  Amion Physician Scheduling and messaging for groups and whole hospitals  On call and physician scheduling software for group practices, residents, hospitalists and other medical providers for call, clinic, rotation and shift schedules. OnCall Enterprise is a hospital-wide system for scheduling doctors and paging doctors on call. EasyPlot is for scientific plotting and data analysis.  [email or web address]  and use [place]'s universal password to access. If you do not have the password, please contact the hospital operator.  3. Locate the [facility] Residential Treatment Facilityprovider you are looking for under [facility] and page to a number that you can be directly reached. 4. If you still have difficulty reaching the provider, please page the [facility](Director on Call) for the Hospitalists listed on amion for assistance.  09/30/2019, 4:05 AM

## 2019-09-30 NOTE — Progress Notes (Signed)
Patient C/O headache and chest soreness. MD paged to get order for pain medication. Tramadol ordered and given to patient. Patient states minimal improvement in pain.

## 2019-10-01 LAB — GLUCOSE, CAPILLARY
Glucose-Capillary: 230 mg/dL — ABNORMAL HIGH (ref 70–99)
Glucose-Capillary: 357 mg/dL — ABNORMAL HIGH (ref 70–99)
Glucose-Capillary: 174 mg/dL — ABNORMAL HIGH (ref 70–99)
Glucose-Capillary: 209 mg/dL — ABNORMAL HIGH (ref 70–99)

## 2019-10-01 LAB — PROTIME-INR
INR: 1.3 — ABNORMAL HIGH (ref 0.8–1.2)
Prothrombin Time: 15.7 seconds — ABNORMAL HIGH (ref 11.4–15.2)

## 2019-10-01 LAB — BASIC METABOLIC PANEL
Anion gap: 8 (ref 5–15)
BUN: 20 mg/dL (ref 6–20)
CO2: 29 mmol/L (ref 22–32)
Calcium: 8.3 mg/dL — ABNORMAL LOW (ref 8.9–10.3)
Chloride: 96 mmol/L — ABNORMAL LOW (ref 98–111)
Creatinine, Ser: 0.85 mg/dL (ref 0.44–1.00)
GFR calc Af Amer: >60 mL/min (ref >60)
GFR calc non Af Amer: >60 mL/min (ref >60)
Glucose, Bld: 245 mg/dL — ABNORMAL HIGH (ref 70–99)
Potassium: 3.7 mmol/L (ref 3.5–5.1)
Sodium: 133 mmol/L — ABNORMAL LOW (ref 135–145)

## 2019-10-01 MED ORDER — WARFARIN SODIUM 5 MG PO TABS
10.0000 mg | ORAL_TABLET | Freq: Once | ORAL | Status: AC
  Administered 2019-10-01: 10 mg via ORAL
  Filled 2019-10-01: qty 2

## 2019-10-01 MED ORDER — INSULIN GLARGINE 100 UNIT/ML ~~LOC~~ SOLN
22.0000 [IU] | SUBCUTANEOUS | Status: DC
  Filled 2019-10-01: qty 0.22

## 2019-10-01 MED ORDER — POTASSIUM CHLORIDE CRYS ER 20 MEQ PO TBCR
30.0000 meq | EXTENDED_RELEASE_TABLET | Freq: Every day | ORAL | Status: DC
  Administered 2019-10-01 – 2019-10-04 (×4): 30 meq via ORAL
  Filled 2019-10-01 (×4): qty 1

## 2019-10-01 MED ORDER — INSULIN GLARGINE 100 UNIT/ML ~~LOC~~ SOLN
20.0000 [IU] | SUBCUTANEOUS | Status: DC
  Administered 2019-10-01 – 2019-10-02 (×2): 20 [IU] via SUBCUTANEOUS
  Filled 2019-10-01 (×3): qty 0.2

## 2019-10-01 MED ORDER — MAGNESIUM SULFATE 2 GM/50ML IV SOLN
2.0000 g | Freq: Once | INTRAVENOUS | Status: AC
  Administered 2019-10-01: 2 g via INTRAVENOUS
  Filled 2019-10-01: qty 50

## 2019-10-01 NOTE — TOC Progression Note (Signed)
Transition of Care [facility]) - Progression Note    Patient Details  Name: [name] MRN: [id number] Date of Birth: [birth year]  [facility]) CM/SW Contact  Nicko Daher, Juliann Pulse, RN Phone Number: 10/01/2019, 2:14 PM  Clinical Narrative: [facility] has accepted for HHRN-CHF protocal.        Barriers to Discharge: Continued Medical Work up  Expected Discharge Plan and Services         Living arrangements for the past 2 months: [place]: RN, PT           Social Determinants of Health (SDOH) Interventions    Readmission Risk Interventions Readmission Risk Prevention Plan 10/01/2019 04/02/2019  Transportation Screening Complete Complete  PCP or Specialist Appt within 3-5 Days Complete Complete  HRI or [place] Complete Complete  Social Work Consult for [place] Planning/Counseling Complete Complete  Palliative Care Screening Not Applicable Not Applicable  Medication Review Press photographer) - Complete  Some recent data might be hidden

## 2019-10-01 NOTE — Progress Notes (Signed)
ANTICOAGULATION CONSULT NOTE - Follow Up  Pharmacy Consult for Warfarin Indication: atrial fibrillation  Allergies  Allergen Reactions  . Shellfish Allergy Swelling    Airway involvement including EMS - Has Epi-Pen  . Dulaglutide Nausea And Vomiting and Other (See Comments)    Multiple episodes of vomiting over multiple days  . Metformin And Related Diarrhea    Patient Measurements: Height: 4\' 11"  (149.9 cm) Weight: 200 lb 2.8 oz (90.8 kg) IBW/kg (Calculated) : 43.2   Vital Signs: Temp: 98.2 F (36.8 C) (03/09 0635) Temp Source: Oral (03/09 0635) BP: 137/91 (03/09 0635) Pulse Rate: 81 (03/09 0635)  Labs: Recent Labs    09/29/19 2109 09/30/19 0018 09/30/19 0159 09/30/19 0600 10/01/19 0424  HGB 12.0  --   --   --   --   HCT 38.6  --   --   --   --   PLT 400  --   --   --   --   LABPROT  --   --   --  15.7* 15.7*  INR  --   --   --  1.3* 1.3*  CREATININE 0.93  --   --   --  0.85  TROPONINIHS  --  76* 100* 83*  --     Estimated Creatinine Clearance: 85.5 mL/min (by C-G formula based on SCr of 0.85 mg/dL).   Medical History: Past Medical History:  Diagnosis Date  . Abnormal Pap smear   . Acute on chronic combined systolic and diastolic CHF (congestive heart failure) ([place]) 02/19/2019  . Acute on chronic congestive heart failure ([place])   . Anemia   . Asthma   . Atrial fibrillation ([place])   . Diabetes mellitus   . DKA, type 2 ([place]) 02/06/2019  . Heart failure with reduced ejection fraction ([place])   . Hypertension   . 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    . Migraines   . Morbid obesity ([place]) 02/06/2019  . Obstructive sleep apnea 06/30/2015  . Polycystic ovarian disease     Medications:  Scheduled:  . atorvastatin  40 mg Oral q1800  . carvedilol  12.5 mg Oral BID WC  . digoxin  0.125 mg Oral Daily  . furosemide  80 mg Intravenous BID  . gabapentin   100 mg Oral Q8H  . insulin aspart  0-15 Units Subcutaneous TID WC  . insulin aspart  0-5 Units Subcutaneous QHS  . insulin glargine  18 Units Subcutaneous Q24H  . sacubitril-valsartan  1 tablet Oral BID  . sodium chloride flush  3 mL Intravenous Q12H  . spironolactone  25 mg Oral Daily  . Warfarin - Pharmacist Dosing Inpatient   Does not apply q1800   Infusions:  . 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   - home dose warfarin:  7.5 mg daily. LD unk date/time INR = 1.3  - inpatient warfarin doses: 10mg  (3/8)  Today, 10/01/19  INR SUBtherapeutic this AM and unchanged from yesterday  NO reported bleeding  Goal of Therapy:  INR 2-3    Plan:   Repeat 10mg  warfarin today  Daily INR   Kara Mead 10/01/2019,7:48 AM

## 2019-10-01 NOTE — Progress Notes (Signed)
PROGRESS NOTE  [name] [id number] DOB: Dec 08, 1975 DOA: 09/29/2019 PCP: [name], DO  HPI/Recap of past 24 hours: [name] is a 44 y.o. female with medical history significant of HFrEF with 20-25% EF, PAF on coumadin, DM2, HTN.  Pt presents to ED with c/o CP and SOB.  Symptoms ongoing for past month.  Recently in ED at [facility] for similar symptoms.  Prescribed QOD lasix, didn't take any today.   CTA chest shows R heart CHF.  BNP 562.9, Trops of 76 and 100.  States she ran out of 1 of her cardiac medications, does not remember the name.  Admits to dyspnea with movement.  Ongoing diuresing and treatment for hypertensive emergency.  Cardiology following.  10/01/19: Seen and examined.  No acute events overnight.  She states she has intermittent chest pain resolving spontaneously.  Ongoing diuresing with IV Lasix 80 mg twice daily.  Assessment/Plan: Principal Problem:   Hypertensive urgency Active Problems:   DM2 (diabetes mellitus, type 2) (HCC)   HFrEF (heart failure with reduced ejection fraction) (HCC)   Paroxysmal atrial fibrillation (HCC)   Acute on chronic systolic CHF (congestive heart failure) (HCC)   Elevated troponin   Hypertensive emergency  Improved hypertensive emergency with chest pain and elevated troponin likely secondary to medication noncompliance Presented with chest pain, elevated troponin peaked at 100, SBP greater than 190 and DBP greater than 130, and volume overload Improved after resumption of her home medications and IV Lasix Continue to monitor vital signs Educated on importance of compliance  Elevated troponin likely secondary to demand ischemia Peaked at 100 and trended down No sign of acute ischemia on EKG No further ischemic work-up per cardiology  Acute on chronic combined systolic and diastolic CHF 2D echo abdominal 09/30/2019 showed LVEF less than 20% with grade 2 diastolic dysfunction, severe RV dysfunction and LV global  hypokinesis Cardiology following- Continue meds as recommended by cardiology Ongoing diuresing with IV Lasix 80 mg twice daily Monitor blood pressure, electrolytes and renal function while on diuretics Continue strict I's and O's and daily weight Continue fluid and sodium restriction  Net I&O -2.4 L, still volume overload on exam.  Type 2 diabetes with hyperglycemia Hemoglobin A1c 11.1 CBGs in the 300's Started Lantus 22 units daily Continue insulin sliding scale  Hypokalemia Potassium 3.7 Goal potassium 4.0 Replete while on diuretics Add IV magnesium 2 g once Obtain magnesium level in the morning repeat BMP in the a.m.  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  Anticipate dc to home in the next 48-72H once volume status has improved and cardiology has signs off. Barrier to dc: ongoing diuresing and cardiac medications adjustment.     Consults called: None  Objective: Vitals:   09/30/19 1233 09/30/19 2118 10/01/19 0600 10/01/19 0635  BP: 116/81 118/86  (!) 137/91  Pulse: 80 81  81  Resp: (!) 22 20  18   Temp: (!) 97.4 F (36.3 C) (!) 97.5 F (36.4 C)  98.2 F (36.8 C)  TempSrc: Oral Oral  Oral  SpO2: 95% 100%  100%  Weight:   90.8 kg   Height:        Intake/Output Summary (Last 24 hours) at 10/01/2019 1425 Last data filed at 10/01/2019 0600 Gross per 24 hour  Intake 240 ml  Output 2250 ml  Net -2010 ml   Filed Weights   09/29/19 1806 09/30/19  0600 10/01/19 0600  Weight: 100 kg 91.5 kg 90.8 kg    Exam:   General: 44 y.o. year-old female well developed well nourished in no acute distress.  Alert and oriented x3.  Cardiovascular: Regular rate and rhythm with no rubs or gallops.  No thyromegaly or JVD noted.    Respiratory: Mild rales at base no wheezing noted.  Abdomen: Soft nontender nondistended with normal bowel sounds x4 quadrants.  Musculoskeletal: 1+ pitting edema in lower extremity bilaterally.   Psychiatry: Mood is appropriate for condition and setting   Data Reviewed: CBC: Recent Labs  Lab 09/24/19 2238 09/29/19 2109  WBC 9.8 11.4*  NEUTROABS  --  7.7  HGB 11.4* 12.0  HCT 35.9* 38.6  MCV 79.1* 78.1*  PLT 359 [id number]   Basic Metabolic Panel: Recent Labs  Lab 09/24/19 2238 09/29/19 2109 10/01/19 0424  NA 132* 130* 133*  K 4.1 4.7 3.7  CL 99 95* 96*  CO2 23 24 29   GLUCOSE 396* 395* 245*  BUN 12 19 20   CREATININE 0.86 0.93 0.85  CALCIUM 8.9 9.0 8.3*   GFR: Estimated Creatinine Clearance: 85.5 mL/min (by C-G formula based on SCr of 0.85 mg/dL). Liver Function Tests: Recent Labs  Lab 09/30/19 0600  AST 55*  ALT 43  ALKPHOS 152*  BILITOT 2.3*  PROT 7.0  ALBUMIN 2.7*   No results for input(s): LIPASE, AMYLASE in the last 168 hours. No results for input(s): AMMONIA in the last 168 hours. Coagulation Profile: Recent Labs  Lab 09/24/19 2238 09/30/19 0600 10/01/19 0424  INR 1.1 1.3* 1.3*   Cardiac Enzymes: No results for input(s): CKTOTAL, CKMB, CKMBINDEX, TROPONINI in the last 168 hours. BNP (last 3 results) No results for input(s): PROBNP in the last 8760 hours. HbA1C: Recent Labs    09/30/19 0600  HGBA1C 11.1*   CBG: Recent Labs  Lab 09/30/19 1116 09/30/19 1615 09/30/19 2114 10/01/19 0819 10/01/19 1146  GLUCAP 322* 132* 186* 230* 357*   Lipid Profile: No results for input(s): CHOL, HDL, LDLCALC, TRIG, CHOLHDL, LDLDIRECT in the last 72 hours. Thyroid Function Tests: No  results for input(s): TSH, T4TOTAL, FREET4, T3FREE, THYROIDAB in the last 72 hours. Anemia Panel: No results for input(s): VITAMINB12, FOLATE, FERRITIN, TIBC, IRON, RETICCTPCT in the last 72 hours. Urine analysis:    Component Value Date/Time   COLORURINE AMBER (A) 02/19/2019 2058   APPEARANCEUR HAZY (A) 02/19/2019 2058   LABSPEC 1.038 (H) 02/19/2019 2058   PHURINE 5.0 02/19/2019 2058   GLUCOSEU 150 (A) 02/19/2019 2058   HGBUR NEGATIVE 02/19/2019 2058   BILIRUBINUR SMALL (A) 02/19/2019 2058   BILIRUBINUR NEG 04/05/2013 1057   KETONESUR NEGATIVE 02/19/2019 2058   PROTEINUR >=300 (A) 02/19/2019 2058   UROBILINOGEN 0.2 08/09/2013 2031   NITRITE NEGATIVE 02/19/2019 2058   LEUKOCYTESUR NEGATIVE 02/19/2019 2058   Sepsis Labs: @LABRCNTIP (procalcitonin:4,lacticidven:4)  ) Recent Results (from the past 240 hour(s))  SARS CORONAVIRUS 2 (TAT 6-24 HRS) Nasopharyngeal Nasopharyngeal Swab     Status: None   Collection Time: 09/30/19  6:04 AM   Specimen: Nasopharyngeal Swab  Result Value Ref Range Status   SARS Coronavirus 2 NEGATIVE NEGATIVE  Final    Comment: (NOTE) SARS-CoV-2 target nucleic acids are NOT DETECTED. The SARS-CoV-2 RNA is generally detectable in upper and lower respiratory specimens during the acute phase of infection. Negative results do not preclude SARS-CoV-2 infection, do not rule out co-infections with other pathogens, and should not be used as the sole basis for treatment or other patient management decisions. Negative results must be combined with clinical observations, patient history, and epidemiological information. The expected result is Negative. Fact Sheet for Patients: SugarRoll.be Fact Sheet for Healthcare Providers: https://www.woods-mathews.com/ This test is not yet approved or cleared by the [place] FDA and  has been authorized for detection and/or diagnosis of SARS-CoV-2 by FDA under an Emergency Use  Authorization (EUA). This EUA will remain  in effect (meaning this test can be used) for the duration of the COVID-19 declaration under Section 56 4(b)(1) of the Act, 21 U.S.C. section 360bbb-3(b)(1), unless the authorization is terminated or revoked sooner. Performed at [facility], [place] [address], [place], [place] [address]       Studies: ECHOCARDIOGRAM COMPLETE  Result Date: 09/30/2019    ECHOCARDIOGRAM REPORT   Patient Name:   VELEDA HARNOIS Date of Exam: 09/30/2019 Medical Rec #:  [id number]       Height:       59.0 in Accession #:    [id number]      Weight:       201.7 lb Date of Birth:  1976-05-08        BSA:          1.851 m Patient Age:    46 years        BP:           116/81 mmHg Patient Gender: F               HR:           80 bpm. Exam Location:  Inpatient Procedure: 2D Echo Indications:    428.21 CHF  History:        Patient has prior history of Echocardiogram examinations, most                 recent 03/28/2019. CHF, Arrythmias:Atrial Fibrillation; Risk                 Factors:Hypertension and Diabetes.  Sonographer:    Jannett Celestine RDCS (AE) Referring Phys: [place]  1. Severe global reduction in LV systolic function; grade 2 diastolic dysfunction; moderate LVH; severe RV dysfunction.  2. Left ventricular ejection fraction, by estimation, is <20%. The left ventricle has severely decreased function. The left ventricle demonstrates global hypokinesis. There is moderate left ventricular hypertrophy. Left ventricular diastolic parameters are consistent with Grade II diastolic dysfunction (pseudonormalization). Elevated left atrial pressure.  3. Right ventricular systolic function is severely reduced. The right ventricular size is normal. There is moderately elevated pulmonary artery systolic pressure.  4. The mitral valve is normal in structure. Trivial mitral valve regurgitation. No evidence of mitral stenosis.  5. The aortic valve is tricuspid. Aortic valve  regurgitation is not visualized. No aortic stenosis is present.  6. The inferior vena cava is dilated in size with <50% respiratory variability, suggesting right atrial pressure of 15 mmHg. FINDINGS  Left Ventricle: Left ventricular ejection fraction, by estimation, is <20%. The left ventricle has severely decreased function. The left ventricle demonstrates global hypokinesis. The left ventricular internal cavity size was normal in size. There is moderate left ventricular hypertrophy.  Left ventricular diastolic parameters are consistent with Grade II diastolic dysfunction (pseudonormalization). Elevated left atrial pressure. Right Ventricle: The right ventricular size is normal. Right ventricular systolic function is severely reduced. There is moderately elevated pulmonary artery systolic pressure. The tricuspid regurgitant velocity is 2.77 m/s, and with an assumed right atrial pressure of 15 mmHg, the estimated right ventricular systolic pressure is [id number] mmHg. Left Atrium: Left atrial size was normal in size. Right Atrium: Right atrial size was normal in size. Pericardium: Trivial pericardial effusion is present. Mitral Valve: The mitral valve is normal in structure. Normal mobility of the mitral valve leaflets. Trivial mitral valve regurgitation. No evidence of mitral valve stenosis. Tricuspid Valve: The tricuspid valve is normal in structure. Tricuspid valve regurgitation is mild . No evidence of tricuspid stenosis. Aortic Valve: The aortic valve is tricuspid. Aortic valve regurgitation is not visualized. No aortic stenosis is present. Pulmonic Valve: The pulmonic valve was normal in structure. Pulmonic valve regurgitation is trivial. No evidence of pulmonic stenosis. Aorta: The aortic root is normal in size and structure. Venous: The inferior vena cava is dilated in size with less than 50% respiratory variability, suggesting right atrial pressure of 15 mmHg.  Additional Comments: Severe global reduction in LV  systolic function; grade 2 diastolic dysfunction; moderate LVH; severe RV dysfunction.  LEFT VENTRICLE PLAX 2D LVIDd:         4.70 cm  Diastology LVIDs:         4.50 cm  LV e' lateral:   4.03 cm/s LV PW:         1.40 cm  LV E/e' lateral: 18.5 LV IVS:        1.20 cm  LV e' medial:    4.79 cm/s LVOT diam:     1.90 cm  LV E/e' medial:  15.6 LV SV:         27 LV SV Index:   15 LVOT Area:     2.84 cm  LEFT ATRIUM           Index LA diam:      3.80 cm 2.05 cm/m LA Vol (A4C): 53.7 ml 29.01 ml/m  AORTIC VALVE LVOT Vmax:   69.80 cm/s LVOT Vmean:  46.000 cm/s LVOT VTI:    0.097 m  AORTA Ao Root diam: 3.10 cm MITRAL VALVE               TRICUSPID VALVE MV Area (PHT): 5.31 cm    TR Peak grad:   30.7 mmHg MV Decel Time: 143 msec    TR Vmax:        277.00 cm/s MV E velocity: 74.60 cm/s MV A velocity: 43.70 cm/s  SHUNTS MV E/A ratio:  1.71        Systemic VTI:  0.10 m                            Systemic Diam: 1.90 cm Kirk Ruths MD Electronically signed by Kirk Ruths MD Signature Date/Time: 09/30/2019/3:38:24 PM    Final     Scheduled Meds:  atorvastatin  40 mg Oral q1800   carvedilol  12.5 mg Oral BID WC   digoxin  0.125 mg Oral Daily   furosemide  80 mg Intravenous BID   gabapentin  100 mg Oral Q8H   insulin aspart  0-15 Units Subcutaneous TID WC   insulin aspart  0-5 Units Subcutaneous QHS   insulin glargine  18 Units Subcutaneous Q24H  sacubitril-valsartan  1 tablet Oral BID   sodium chloride flush  3 mL Intravenous Q12H   spironolactone  25 mg Oral Daily   warfarin  10 mg Oral ONCE-1800   Warfarin - Pharmacist Dosing Inpatient   Does not apply q1800    Continuous Infusions:  sodium chloride       LOS: 1 day     Kayleen Memos, MD [facility] Pager [phone]  If 7PM-7AM, please contact night-coverage [email or web address] Password [email or web address] 10/01/2019, 2:25 PM

## 2019-10-01 NOTE — Progress Notes (Addendum)
Progress Note  Patient Name: [name] Date of Encounter: 10/01/2019  Primary Cardiologist: Loralie Champagne, MD   Subjective   Pt states that her breathing is mildly improved. Still with lower extremity swelling.  Inpatient Medications    Scheduled Meds:  atorvastatin  40 mg Oral q1800   carvedilol  12.5 mg Oral BID WC   digoxin  0.125 mg Oral Daily   furosemide  80 mg Intravenous BID   gabapentin  100 mg Oral Q8H   insulin aspart  0-15 Units Subcutaneous TID WC   insulin aspart  0-5 Units Subcutaneous QHS   insulin glargine  18 Units Subcutaneous Q24H   sacubitril-valsartan  1 tablet Oral BID   sodium chloride flush  3 mL Intravenous Q12H   spironolactone  25 mg Oral Daily   warfarin  10 mg Oral ONCE-1800   Warfarin - Pharmacist Dosing Inpatient   Does not apply q1800   Continuous Infusions:  sodium chloride     PRN Meds: sodium chloride, acetaminophen, albuterol, labetalol, meclizine, methocarbamol, ondansetron (ZOFRAN) IV, sodium chloride flush, traMADol, traZODone   Vital Signs    Vitals:   09/30/19 1233 09/30/19 2118 10/01/19 0600 10/01/19 0635  BP: 116/81 118/86  (!) 137/91  Pulse: 80 81  81  Resp: (!) 22 20  18   Temp: (!) 97.4 F (36.3 C) (!) 97.5 F (36.4 C)  98.2 F (36.8 C)  TempSrc: Oral Oral  Oral  SpO2: 95% 100%  100%  Weight:   90.8 kg   Height:        Intake/Output Summary (Last 24 hours) at 10/01/2019 0831 Last data filed at 10/01/2019 0600 Gross per 24 hour  Intake 600 ml  Output 3050 ml  Net -2450 ml   Last 3 Weights 10/01/2019 09/30/2019 09/29/2019  Weight (lbs) 200 lb 2.8 oz 201 lb 11.5 oz 220 lb 7.4 oz  Weight (kg) 90.8 kg 91.5 kg 100 kg      Telemetry    Sinus rhythm 70-90s - Personally Reviewed  ECG    No new tracings - Personally Reviewed  Physical Exam   GEN: obese female in no acute distress.   Neck: No JVD Cardiac: RRR, no murmurs, rubs, or gallops.  Respiratory: respirations unlabored, . GI: Soft, nontender,  non-distended  MS: No edema; No deformity. Neuro:  Nonfocal  Psych: Normal affect   Labs    High Sensitivity Troponin:   Recent Labs  Lab 09/24/19 2238 09/25/19 0035 09/30/19 0018 09/30/19 0159 09/30/19 0600  TROPONINIHS 23* 23* 76* 100* 83*      Chemistry Recent Labs  Lab 09/24/19 2238 09/29/19 2109 09/30/19 0600 10/01/19 0424  NA 132* 130*  --  133*  K 4.1 4.7  --  3.7  CL 99 95*  --  96*  CO2 23 24  --  29  GLUCOSE 396* 395*  --  245*  BUN 12 19  --  20  CREATININE 0.86 0.93  --  0.85  CALCIUM 8.9 9.0  --  8.3*  PROT  --   --  7.0  --   ALBUMIN  --   --  2.7*  --   AST  --   --  55*  --   ALT  --   --  43  --   ALKPHOS  --   --  152*  --   BILITOT  --   --  2.3*  --   GFRNONAA >60 >60  --  >60  GFRAA >60 >60  --  >60  ANIONGAP 10 11  --  8     Hematology Recent Labs  Lab 09/24/19 2238 09/29/19 2109  WBC 9.8 11.4*  RBC 4.54 4.94  HGB 11.4* 12.0  HCT 35.9* 38.6  MCV 79.1* 78.1*  MCH 25.1* 24.3*  MCHC 31.8 31.1  RDW 13.8 14.1  PLT 359 400    BNP Recent Labs  Lab 09/25/19 0148 09/29/19 2109  BNP 277.8* 562.9*     DDimer No results for input(s): DDIMER in the last 168 hours.   Radiology    DG Chest 2 View  Result Date: 09/29/2019 CLINICAL DATA:  Shortness of breath and chest pain for 1 month EXAM: CHEST - 2 VIEW COMPARISON:  Radiograph 09/24/2019 FINDINGS: Cardiomegaly is similar to comparison exams. Stable area of scarring in the left mid lung. There is mild central pulmonary vascular congestion without overt edema. No pneumothorax or effusion. No acute osseous or soft tissue abnormality. Telemetry leads overlie the chest. IMPRESSION: Stable cardiomegaly and mild central pulmonary vascular congestion without overt edema. Electronically Signed   By: Lovena Le M.D.   On: 09/29/2019 20:58   CT Angio Chest PE W and/or Wo Contrast  Result Date: 09/30/2019 CLINICAL DATA:  44 year old female with chest pain and shortness of breath for 1 month.  EXAM: CT ANGIOGRAPHY CHEST WITH CONTRAST TECHNIQUE: Multidetector CT imaging of the chest was performed using the standard protocol during bolus administration of intravenous contrast. Multiplanar CT image reconstructions and MIPs were obtained to evaluate the vascular anatomy. CONTRAST:  118mL OMNIPAQUE IOHEXOL 350 MG/ML SOLN COMPARISON:  Chest radiographs 09/29/2019 and earlier. FINDINGS: Cardiovascular: Excellent contrast bolus timing in the pulmonary arterial tree. Mild lower lobe respiratory motion. No focal filling defect identified in the pulmonary arteries to suggest acute pulmonary embolism. Mild cardiomegaly (series 4, image 72). No pericardial effusion. There is some contrast reflux into the IVC and hepatic veins. No contrast in the aorta. No calcified coronary artery atherosclerosis is evident. Mediastinum/Nodes: Negative. Lungs/Pleura: Major airways are patent. There is a small layering right pleural effusion. Mild associated lower lobe opacity most resembles atelectasis. There is also platelike atelectasis in the left lower lobe and lingula. Upper Abdomen: Negative visible liver, spleen, pancreas, adrenal glands, kidneys and bowel. Musculoskeletal: No acute osseous abnormality identified. Review of the MIP images confirms the above findings. IMPRESSION: 1. Negative for acute pulmonary embolus. 2. Cardiomegaly with some contrast reflux into the hepatic vein and IVC raising the possibility of right heart insufficiency. 3. Superimposed small layering right pleural effusion and mild pulmonary atelectasis. \ Electronically Signed   By: Genevie Ann M.D.   On: 09/30/2019 02:12   ECHOCARDIOGRAM COMPLETE  Result Date: 09/30/2019    ECHOCARDIOGRAM REPORT   Patient Name:   [name] Date of Exam: 09/30/2019 Medical Rec #:  [id number]       Height:       59.0 in Accession #:    [id number]      Weight:       201.7 lb Date of Birth:  24-May-1976        BSA:          1.851 m Patient Age:    44 years        BP:            116/81 mmHg Patient Gender: F               HR:  80 bpm. Exam Location:  Inpatient Procedure: 2D Echo Indications:    428.21 CHF  History:        Patient has prior history of Echocardiogram examinations, most                 recent 03/28/2019. CHF, Arrythmias:Atrial Fibrillation; Risk                 Factors:Hypertension and Diabetes.  Sonographer:    Jannett Celestine RDCS (AE) Referring Phys: [place]  1. Severe global reduction in LV systolic function; grade 2 diastolic dysfunction; moderate LVH; severe RV dysfunction.  2. Left ventricular ejection fraction, by estimation, is <20%. The left ventricle has severely decreased function. The left ventricle demonstrates global hypokinesis. There is moderate left ventricular hypertrophy. Left ventricular diastolic parameters are consistent with Grade II diastolic dysfunction (pseudonormalization). Elevated left atrial pressure.  3. Right ventricular systolic function is severely reduced. The right ventricular size is normal. There is moderately elevated pulmonary artery systolic pressure.  4. The mitral valve is normal in structure. Trivial mitral valve regurgitation. No evidence of mitral stenosis.  5. The aortic valve is tricuspid. Aortic valve regurgitation is not visualized. No aortic stenosis is present.  6. The inferior vena cava is dilated in size with <50% respiratory variability, suggesting right atrial pressure of 15 mmHg. FINDINGS  Left Ventricle: Left ventricular ejection fraction, by estimation, is <20%. The left ventricle has severely decreased function. The left ventricle demonstrates global hypokinesis. The left ventricular internal cavity size was normal in size. There is moderate left ventricular hypertrophy. Left ventricular diastolic parameters are consistent with Grade II diastolic dysfunction (pseudonormalization). Elevated left atrial pressure. Right Ventricle: The right ventricular size is normal. Right ventricular  systolic function is severely reduced. There is moderately elevated pulmonary artery systolic pressure. The tricuspid regurgitant velocity is 2.77 m/s, and with an assumed right atrial pressure of 15 mmHg, the estimated right ventricular systolic pressure is [id number] mmHg. Left Atrium: Left atrial size was normal in size. Right Atrium: Right atrial size was normal in size. Pericardium: Trivial pericardial effusion is present. Mitral Valve: The mitral valve is normal in structure. Normal mobility of the mitral valve leaflets. Trivial mitral valve regurgitation. No evidence of mitral valve stenosis. Tricuspid Valve: The tricuspid valve is normal in structure. Tricuspid valve regurgitation is mild . No evidence of tricuspid stenosis. Aortic Valve: The aortic valve is tricuspid. Aortic valve regurgitation is not visualized. No aortic stenosis is present. Pulmonic Valve: The pulmonic valve was normal in structure. Pulmonic valve regurgitation is trivial. No evidence of pulmonic stenosis. Aorta: The aortic root is normal in size and structure. Venous: The inferior vena cava is dilated in size with less than 50% respiratory variability, suggesting right atrial pressure of 15 mmHg.  Additional Comments: Severe global reduction in LV systolic function; grade 2 diastolic dysfunction; moderate LVH; severe RV dysfunction.  LEFT VENTRICLE PLAX 2D LVIDd:         4.70 cm  Diastology LVIDs:         4.50 cm  LV e' lateral:   4.03 cm/s LV PW:         1.40 cm  LV E/e' lateral: 18.5 LV IVS:        1.20 cm  LV e' medial:    4.79 cm/s LVOT diam:     1.90 cm  LV E/e' medial:  15.6 LV SV:         27 LV SV Index:  15 LVOT Area:     2.84 cm  LEFT ATRIUM           Index LA diam:      3.80 cm 2.05 cm/m LA Vol (A4C): 53.7 ml 29.01 ml/m  AORTIC VALVE LVOT Vmax:   69.80 cm/s LVOT Vmean:  46.000 cm/s LVOT VTI:    0.097 m  AORTA Ao Root diam: 3.10 cm MITRAL VALVE               TRICUSPID VALVE MV Area (PHT): 5.31 cm    TR Peak grad:   30.7 mmHg MV  Decel Time: 143 msec    TR Vmax:        277.00 cm/s MV E velocity: 74.60 cm/s MV A velocity: 43.70 cm/s  SHUNTS MV E/A ratio:  1.71        Systemic VTI:  0.10 m                            Systemic Diam: 1.90 cm Kirk Ruths MD Electronically signed by Kirk Ruths MD Signature Date/Time: 09/30/2019/3:38:24 PM    Final     Cardiac Studies   Echo 09/30/19:  1. Severe global reduction in LV systolic function; grade 2 diastolic  dysfunction; moderate LVH; severe RV dysfunction.   2. Left ventricular ejection fraction, by estimation, is <20%. The left  ventricle has severely decreased function. The left ventricle demonstrates  global hypokinesis. There is moderate left ventricular hypertrophy. Left  ventricular diastolic parameters  are consistent with Grade II diastolic dysfunction (pseudonormalization).  Elevated left atrial pressure.   3. Right ventricular systolic function is severely reduced. The right  ventricular size is normal. There is moderately elevated pulmonary artery  systolic pressure.   4. The mitral valve is normal in structure. Trivial mitral valve  regurgitation. No evidence of mitral stenosis.   5. The aortic valve is tricuspid. Aortic valve regurgitation is not  visualized. No aortic stenosis is present.   6. The inferior vena cava is dilated in size with <50% respiratory  variability, suggesting right atrial pressure of 15 mmHg.   Patient Profile     44 y.o. female with a history of no significant CAD on cardiac catheterization in [id number], chronic systolic CHF/non-ischemic cardiomyopathy with EF of 20-25% on Echo in 03/2019, paroxysmal atrial fibrillation on Coumadin, poorly controlled hypertension, hyperlipidemia, poorly controlled type 2 diabetes mellitus, and morbid obesity who is being seen today for the evaluation of acute on chronic CHF.   Assessment & Plan    1. Acute on chronic systolic and diastolic heart failure with severe RV dysfunction - echo yesterday with  EF estimated at < 20%, reduction from prior echo 03/2019 with EF 20-25% - grade 2 DD - nonischemic cardiomyopathy given cath results 03/2019 - pt is diuresing on 80 mg IV lasix BID - overall net negative - continue coreg 12.5 mg BID, digoxin 0.125 mg daily, entresto 49/51 mg BID, 25 mg spironolactone - given her right heart failure, may require torsemide OP - overall net negative 2.5L with 3L urine output yesterday  - weight 200 lbs down from 220 lbs - she states her dry weight is 165 lbs - CT chest negative for PE - continue with diuresis today   2. Hyperlipidemia - cath 03/2019 negative for obstructive CAD - continue statin   3. Elevated troponin consistent with demand ischemia - hs troponin 76 --> 100 --> 83 - EKG nonischemic - heart  cath 03/2019 reassuring - no further ischemic workup   4. Paroxysmal atrial fibrillation 5. Apical thrombus by echo 03/2019 - medications as above - anticoagulated with coumadin - admitted with subtherapeutic INR of 1.3 - echo yesterday did not mention apical thrombus - will review with Dr. Johnsie Cancel - telemetry appears to be NSR   6. Hypertension - medications as above - pt was out of medications - pressures much better controlled   7. Noncompliance  - educated on importance of medication compliance and office visits.   8. Poorly controlled DM2 - A1c 11.1% - needs OP follow up and teaching     For questions or updates, please contact [place] Please consult [email or web address] for contact info under        Signed, Ledora Bottcher, PA  10/01/2019, 8:31 AM    Patient examined chart reviewed. Improved with over 2 L diuresis Still with plus one LE edema would continue iv diuresis another 24-48 hours then change to oral No apical thrombus on echo. Telemetry with NSR today continue anticoagulation per pharmacy Her lungs are clear and she is comfortable with exertion   Jenkins Rouge MD [facility]

## 2019-10-01 NOTE — Progress Notes (Signed)
Inpatient Diabetes Program Recommendations  AACE/ADA: New Consensus Statement on Inpatient Glycemic Control (2015)  Target Ranges:  Prepandial:   less than 140 mg/dL      Peak postprandial:   less than 180 mg/dL (1-2 hours)      Critically ill patients:  140 - 180 mg/dL   Lab Results  Component Value Date   GLUCAP 230 (H) 10/01/2019   HGBA1C 11.1 (H) 09/30/2019    Review of Glycemic Control Results for DORINNE, WESBY (MRN [id number]) as of 10/01/2019 10:22  Ref. Range 09/30/2019 06:44 09/30/2019 11:16 09/30/2019 16:15 09/30/2019 21:14 10/01/2019 08:19  Glucose-Capillary Latest Ref Range: 70 - 99 mg/dL 346 (H) 322 (H) 132 (H) 186 (H) 230 (H)  Diabetes history: DM  Outpatient Diabetes medications:  Jardiance 25 mg daily,  Current orders for Inpatient glycemic control:  Novolog moderate tid with meals and HS Lantus 18 units daily Inpatient Diabetes Program Recommendations:    Consider slight increase of Lantus to 22 units daily. Appears to need insulin at d/c.  Patient has been on insulin before.   Thanks,  Adah Perl, RN, BC-ADM Inpatient Diabetes Coordinator Pager [phone]

## 2019-10-01 NOTE — TOC Initial Note (Signed)
Transition of Care [facility]) - Initial/Assessment Note    Patient Details  Name: [name] MRN: [id number] Date of Birth: [birth year]  Transition of Care [facility]) CM/SW Contact:    Dessa Phi, RN Phone Number: 10/01/2019, 12:13 PM  Clinical Narrative: No insurance-left vm w/financial counselor-Ana await call back. [employer] rep Britany aware of referral for charity-HHRN-chf protocal-await if able to accept. [facility] pharmacy to be used for 1 time free fill on lantus-please send them an email, & send script to [facility] pharmacy once meds are confirmed if d/c during the week. If d/c over weekend then use [facility] program.Patient has own transportation home.                    Barriers to Discharge: Continued Medical Work up   Patient Goals and CMS Choice Patient states their goals for this hospitalization and ongoing recovery are:: go home      Expected Discharge Plan and Services         Living arrangements for the past 2 months: Single Family Home                           HH Arranged: RN, PT          Prior Living Arrangements/Services Living arrangements for the past 2 months: Single Family Home Lives with:: Adult Children Patient language and need for interpreter reviewed:: Yes Do you feel safe going back to the place where you live?: Yes      Need for Family Participation in Patient Care: No (Comment) Care giver support system in place?: Yes (comment)   Criminal Activity/Legal Involvement Pertinent to Current Situation/Hospitalization: No - Comment as needed  Activities of Daily Living Home Assistive Devices/Equipment: Eyeglasses, CBG Meter ADL Screening (condition at time of admission) Patient's cognitive ability adequate to safely complete daily activities?: Yes Is the patient deaf or have difficulty hearing?: No Does the patient have difficulty seeing, even when wearing glasses/contacts?: No Does the patient have difficulty concentrating, remembering, or making  decisions?: No Patient able to express need for assistance with ADLs?: Yes Does the patient have difficulty dressing or bathing?: No Independently performs ADLs?: Yes (appropriate for developmental age) Does the patient have difficulty walking or climbing stairs?: Yes(secondary to shortness of breath) Weakness of Legs: Both Weakness of Arms/Hands: None  Permission Sought/Granted Permission sought to share information with : Case Manager Permission granted to share information with : Yes, Verbal Permission Granted  Share Information with NAME: Case Manager           Emotional Assessment Appearance:: Appears stated age Attitude/Demeanor/Rapport: Gracious Affect (typically observed): Accepting Orientation: : Oriented to Self, Oriented to Place, Oriented to  Time, Oriented to Situation Alcohol / Substance Use: Not Applicable Psych Involvement: No (comment)  Admission diagnosis:  NSTEMI (non-ST elevated myocardial infarction) ([place]) [I21.4] Hypertensive urgency [I16.0] Acute on chronic congestive heart failure, unspecified heart failure type ([place]) [I50.9] Hypertensive emergency [I16.1] Patient Active Problem List   Diagnosis Date Noted  . Hypertensive urgency 09/30/2019  . Elevated troponin 09/30/2019  . Hypertensive emergency 09/30/2019  . Attempting to conceive 03/10/2019  . Acute on chronic systolic CHF (congestive heart failure) ([place]) 02/19/2019  . Asthma   . Yeast infection 02/06/2019  . Right ventricular failure ([place]) 02/06/2019  . Morbid obesity ([place]) 02/06/2019  . HFrEF (heart failure with reduced ejection fraction) ([place]) 12/31/2018  . Paroxysmal atrial fibrillation ([place]) 12/31/2018  . Anxiety 12/31/2018  . Hyperlipidemia associated  with type 2 diabetes mellitus ([place]) 10/30/2017  . Obstructive sleep apnea 06/30/2015  . Anemia due to blood loss, chronic 06/27/2012  . Allergic rhinitis 06/27/2012  . Major depressive disorder 02/08/2012  . Hypertension associated with  diabetes ([place]) 12/04/2011  . Dysmenorrhea 11/22/2011  . DM2 (diabetes mellitus, type 2) ([place]) 05/19/2011  . PCOS (polycystic ovarian syndrome) 05/19/2011  . Hydrosalpinx 08/21/2009   PCP:  [name], DO Pharmacy:   [place], [place] - [address] [place] [address] Phone: [phone] Fax: [phone]  [facility] [address], [place] - [address] [place] [address] Phone: [phone] Fax: [place], [place] [address] Phone: [phone] Fax: [phone]  [place], [address] [place] [address] Phone: [phone] Fax: [phone]     Social Determinants of Health (SDOH) Interventions    Readmission Risk Interventions Readmission Risk Prevention Plan 04/02/2019  Transportation Screening Complete  PCP or Specialist Appt within 3-5 Days Complete  HRI or [place] Complete  Social Work Consult for [place] Planning/Counseling Complete  Palliative Care Screening Not Applicable  Medication Review Press photographer) Complete  Some recent data might be hidden

## 2019-10-01 NOTE — TOC Progression Note (Signed)
Transition of Care [facility]) - Progression Note    Patient Details  Name: [name] MRN: [id number] Date of Birth: [birth year]  Transition of Care [facility]) CM/SW Contact  Kendal Raffo, Juliann Pulse, RN Phone Number: 10/01/2019, 12:18 PM  Clinical Narrative: Patient has pcp,pharmacy states no problem getting her pcp appts,-will use 1x free fill for lantus, & HHRN for disease mgmnt.        Barriers to Discharge: Continued Medical Work up  Expected Discharge Plan and Services         Living arrangements for the past 2 months: [place]: RN, PT           Social Determinants of Health (SDOH) Interventions    Readmission Risk Interventions Readmission Risk Prevention Plan 04/02/2019  Transportation Screening Complete  PCP or Specialist Appt within 3-5 Days Complete  HRI or [place] Complete  Social Work Consult for [place] Planning/Counseling Complete  Palliative Care Screening Not Applicable  Medication Review Press photographer) Complete  Some recent data might be hidden

## 2019-10-02 ENCOUNTER — Telehealth: Payer: Medicaid Other | Admitting: Family Medicine

## 2019-10-02 DIAGNOSIS — I161 Hypertensive emergency: Secondary | ICD-10-CM

## 2019-10-02 DIAGNOSIS — I160 Hypertensive urgency: Secondary | ICD-10-CM

## 2019-10-02 DIAGNOSIS — I5020 Unspecified systolic (congestive) heart failure: Secondary | ICD-10-CM

## 2019-10-02 LAB — BASIC METABOLIC PANEL
Anion gap: 6 (ref 5–15)
BUN: 24 mg/dL — ABNORMAL HIGH (ref 6–20)
CO2: 30 mmol/L (ref 22–32)
Calcium: 8.2 mg/dL — ABNORMAL LOW (ref 8.9–10.3)
Chloride: 98 mmol/L (ref 98–111)
Creatinine, Ser: 0.93 mg/dL (ref 0.44–1.00)
GFR calc Af Amer: >60 mL/min (ref >60)
GFR calc non Af Amer: >60 mL/min (ref >60)
Glucose, Bld: 176 mg/dL — ABNORMAL HIGH (ref 70–99)
Potassium: 3.9 mmol/L (ref 3.5–5.1)
Sodium: 134 mmol/L — ABNORMAL LOW (ref 135–145)

## 2019-10-02 LAB — GLUCOSE, CAPILLARY
Glucose-Capillary: 187 mg/dL — ABNORMAL HIGH (ref 70–99)
Glucose-Capillary: 263 mg/dL — ABNORMAL HIGH (ref 70–99)
Glucose-Capillary: 190 mg/dL — ABNORMAL HIGH (ref 70–99)
Glucose-Capillary: 199 mg/dL — ABNORMAL HIGH (ref 70–99)

## 2019-10-02 LAB — MAGNESIUM: Magnesium: 2.1 mg/dL (ref 1.7–2.4)

## 2019-10-02 LAB — PROTIME-INR
INR: 1.2 (ref 0.8–1.2)
Prothrombin Time: 15.4 seconds — ABNORMAL HIGH (ref 11.4–15.2)

## 2019-10-02 MED ORDER — WARFARIN SODIUM 5 MG PO TABS
10.0000 mg | ORAL_TABLET | Freq: Once | ORAL | Status: AC
  Administered 2019-10-02: 10 mg via ORAL
  Filled 2019-10-02: qty 2

## 2019-10-02 MED ORDER — INSULIN ASPART 100 UNIT/ML ~~LOC~~ SOLN
3.0000 [IU] | Freq: Three times a day (TID) | SUBCUTANEOUS | Status: DC
  Administered 2019-10-02 – 2019-10-04 (×6): 3 [IU] via SUBCUTANEOUS

## 2019-10-02 NOTE — Plan of Care (Signed)
  Problem: Education: Goal: Knowledge of General Education information will improve Description: Including pain rating scale, medication(s)/side effects and non-pharmacologic comfort measures Outcome: Progressing   Problem: Clinical Measurements: Goal: Will remain free from infection Outcome: Progressing   Problem: Nutrition: Goal: Adequate nutrition will be maintained Outcome: Progressing   Problem: Coping: Goal: Level of anxiety will decrease Outcome: Progressing   Problem: Safety: Goal: Ability to remain free from injury will improve Outcome: Progressing   Problem: Skin Integrity: Goal: Risk for impaired skin integrity will decrease Outcome: Progressing

## 2019-10-02 NOTE — Progress Notes (Signed)
[place] for Warfarin Indication: atrial fibrillation  Allergies  Allergen Reactions  . Shellfish Allergy Swelling    Airway involvement including EMS - Has Epi-Pen  . Dulaglutide Nausea And Vomiting and Other (See Comments)    Multiple episodes of vomiting over multiple days  . Metformin And Related Diarrhea    Patient Measurements: Height: 4\' 11"  (149.9 cm) Weight: 200 lb 2.8 oz (90.8 kg) IBW/kg (Calculated) : 43.2  Vital Signs: Temp: 97.4 F (36.3 C) (03/10 0634) Temp Source: Oral (03/10 0634) BP: 117/90 (03/10 0634) Pulse Rate: 75 (03/10 0634)  Labs: Recent Labs    09/29/19 2109 09/30/19 0018 09/30/19 0159 09/30/19 0600 10/01/19 0424 10/02/19 0518  HGB 12.0  --   --   --   --   --   HCT 38.6  --   --   --   --   --   PLT 400  --   --   --   --   --   LABPROT  --   --   --  15.7* 15.7* 15.4*  INR  --   --   --  1.3* 1.3* 1.2  CREATININE 0.93  --   --   --  0.85 0.93  TROPONINIHS  --  76* 100* 83*  --   --     Estimated Creatinine Clearance: 78.2 mL/min (by C-G formula based on SCr of 0.93 mg/dL).   Medications:  Scheduled:  . atorvastatin  40 mg Oral q1800  . carvedilol  12.5 mg Oral BID WC  . digoxin  0.125 mg Oral Daily  . furosemide  80 mg Intravenous BID  . gabapentin  100 mg Oral Q8H  . insulin aspart  0-15 Units Subcutaneous TID WC  . insulin aspart  0-5 Units Subcutaneous QHS  . insulin glargine  20 Units Subcutaneous Q24H  . potassium chloride  30 mEq Oral Daily  . sacubitril-valsartan  1 tablet Oral BID  . sodium chloride flush  3 mL Intravenous Q12H  . spironolactone  25 mg Oral Daily  . Warfarin - Pharmacist Dosing Inpatient   Does not apply q1800   Infusions:  . sodium chloride      Assessment: 77 yoF with hx a-fib and apical thrombus on chronic warfarin admitted with CP and SOB.   - home dose warfarin: 7.5 mg daily. LD unk date/time INR = 1.3   Today, 10/02/2019:  CBC: stable WNL  INR  remains SUBtherapeutic despite several boosted doses; not unexpected after ~1wk off warfarin  Major drug interactions: none noted  No bleeding issues per nursing  Eating 100% of meals  Goal of Therapy: INR 2-3  Plan:   Repeat 10mg  warfarin today  Daily INR  Monitor for signs of bleeding or worsening thrombosis   Nyna Chilton A 10/02/2019,9:39 AM

## 2019-10-02 NOTE — Progress Notes (Addendum)
Progress Note  Patient Name: [name] Date of Encounter: 10/02/2019  Primary Cardiologist: Loralie Champagne, MD   Subjective   No significant overnight events. Patient states breathing and energy level are improving but still not back to normal. She does not feel like lower extremity edema has improved. She continues to have intermittent chest tightness while at rest but states it has improved as well. Also notes brief episodes of palpitations. She notes continued abdominal pain but states nausea and vomiting have improved.  Inpatient Medications    Scheduled Meds:  atorvastatin  40 mg Oral q1800   carvedilol  12.5 mg Oral BID WC   digoxin  0.125 mg Oral Daily   furosemide  80 mg Intravenous BID   gabapentin  100 mg Oral Q8H   insulin aspart  0-15 Units Subcutaneous TID WC   insulin aspart  0-5 Units Subcutaneous QHS   insulin glargine  20 Units Subcutaneous Q24H   potassium chloride  30 mEq Oral Daily   sacubitril-valsartan  1 tablet Oral BID   sodium chloride flush  3 mL Intravenous Q12H   spironolactone  25 mg Oral Daily   Warfarin - Pharmacist Dosing Inpatient   Does not apply q1800   Continuous Infusions:  sodium chloride     PRN Meds: sodium chloride, acetaminophen, albuterol, labetalol, meclizine, methocarbamol, ondansetron (ZOFRAN) IV, sodium chloride flush, traMADol, traZODone   Vital Signs    Vitals:   10/01/19 1448 10/01/19 2053 10/01/19 2100 10/02/19 0634  BP: 113/88 94/68 115/76 117/90  Pulse: 81 78 79 75  Resp: '17 18  18  ' Temp: 98 F (36.7 C) 98.2 F (36.8 C)  (!) 97.4 F (36.3 C)  TempSrc: Oral   Oral  SpO2: 98% 98%  100%  Weight:      Height:        Intake/Output Summary (Last 24 hours) at 10/02/2019 0729 Last data filed at 10/02/2019 0000 Gross per 24 hour  Intake 530 ml  Output 1700 ml  Net -1170 ml   Last 3 Weights 10/01/2019 09/30/2019 09/29/2019  Weight (lbs) 200 lb 2.8 oz 201 lb 11.5 oz 220 lb 7.4 oz  Weight (kg) 90.8 kg 91.5 kg 100 kg        Telemetry    Normal sinus rhythm with rates in the 70's to 80's. One short run of non-sustained VT (6 beats). - Personally Reviewed  ECG    No new ECG tracing today. - Personally Reviewed  Physical Exam   GEN: Morbidly obese African-American female in no acute distress.   Neck: Supple.  Cardiac: RRR. No significant murmurs, gallops, or rubs appreciated. Respiratory: No increased work of breath. Possible mild crackles noted in bases but otherwise lungs clear. GI: Soft, obese, and mildly tender to palpation. Bowel sounds present. MS: Trace to 1+ pitting edema of bilateral lower extremities. Skin: Warm and dry.  Neuro:  No focal deficits.  Psych: Normal affect. Responds appropriately.   Labs    High Sensitivity Troponin:   Recent Labs  Lab 09/24/19 2238 09/25/19 0035 09/30/19 0018 09/30/19 0159 09/30/19 0600  TROPONINIHS 23* 23* 76* 100* 83*      Chemistry Recent Labs  Lab 09/29/19 2109 09/30/19 0600 10/01/19 0424 10/02/19 0518  NA 130*  --  133* 134*  K 4.7  --  3.7 3.9  CL 95*  --  96* 98  CO2 24  --  29 30  GLUCOSE 395*  --  245* 176*  BUN 19  --  20 24*  CREATININE 0.93  --  0.85 0.93  CALCIUM 9.0  --  8.3* 8.2*  PROT  --  7.0  --   --   ALBUMIN  --  2.7*  --   --   AST  --  55*  --   --   ALT  --  43  --   --   ALKPHOS  --  152*  --   --   BILITOT  --  2.3*  --   --   GFRNONAA >60  --  >60 >60  GFRAA >60  --  >60 >60  ANIONGAP 11  --  8 6     Hematology Recent Labs  Lab 09/29/19 2109  WBC 11.4*  RBC 4.94  HGB 12.0  HCT 38.6  MCV 78.1*  MCH 24.3*  MCHC 31.1  RDW 14.1  PLT 400    BNP Recent Labs  Lab 09/29/19 2109  BNP 562.9*     DDimer No results for input(s): DDIMER in the last 168 hours.   Radiology    ECHOCARDIOGRAM COMPLETE  Result Date: 09/30/2019    ECHOCARDIOGRAM REPORT   Patient Name:   RENEA SCHOONMAKER Date of Exam: 09/30/2019 Medical Rec #:  [id number]       Height:       59.0 in Accession #:    [id number]       Weight:       201.7 lb Date of Birth:  05-Sep-1975        BSA:          1.851 m Patient Age:    44 years        BP:           116/81 mmHg Patient Gender: F               HR:           80 bpm. Exam Location:  Inpatient Procedure: 2D Echo Indications:    428.21 CHF  History:        Patient has prior history of Echocardiogram examinations, most                 recent 03/28/2019. CHF, Arrythmias:Atrial Fibrillation; Risk                 Factors:Hypertension and Diabetes.  Sonographer:    Jannett Celestine RDCS (AE) Referring Phys: Henry  1. Severe global reduction in LV systolic function; grade 2 diastolic dysfunction; moderate LVH; severe RV dysfunction.  2. Left ventricular ejection fraction, by estimation, is <20%. The left ventricle has severely decreased function. The left ventricle demonstrates global hypokinesis. There is moderate left ventricular hypertrophy. Left ventricular diastolic parameters are consistent with Grade II diastolic dysfunction (pseudonormalization). Elevated left atrial pressure.  3. Right ventricular systolic function is severely reduced. The right ventricular size is normal. There is moderately elevated pulmonary artery systolic pressure.  4. The mitral valve is normal in structure. Trivial mitral valve regurgitation. No evidence of mitral stenosis.  5. The aortic valve is tricuspid. Aortic valve regurgitation is not visualized. No aortic stenosis is present.  6. The inferior vena cava is dilated in size with <50% respiratory variability, suggesting right atrial pressure of 15 mmHg. FINDINGS  Left Ventricle: Left ventricular ejection fraction, by estimation, is <20%. The left ventricle has severely decreased function. The left ventricle demonstrates global hypokinesis. The left ventricular internal cavity size was normal in size. There is moderate  left ventricular hypertrophy. Left ventricular diastolic parameters are consistent with Grade II diastolic dysfunction  (pseudonormalization). Elevated left atrial pressure. Right Ventricle: The right ventricular size is normal. Right ventricular systolic function is severely reduced. There is moderately elevated pulmonary artery systolic pressure. The tricuspid regurgitant velocity is 2.77 m/s, and with an assumed right atrial pressure of 15 mmHg, the estimated right ventricular systolic pressure is 32.9 mmHg. Left Atrium: Left atrial size was normal in size. Right Atrium: Right atrial size was normal in size. Pericardium: Trivial pericardial effusion is present. Mitral Valve: The mitral valve is normal in structure. Normal mobility of the mitral valve leaflets. Trivial mitral valve regurgitation. No evidence of mitral valve stenosis. Tricuspid Valve: The tricuspid valve is normal in structure. Tricuspid valve regurgitation is mild . No evidence of tricuspid stenosis. Aortic Valve: The aortic valve is tricuspid. Aortic valve regurgitation is not visualized. No aortic stenosis is present. Pulmonic Valve: The pulmonic valve was normal in structure. Pulmonic valve regurgitation is trivial. No evidence of pulmonic stenosis. Aorta: The aortic root is normal in size and structure. Venous: The inferior vena cava is dilated in size with less than 50% respiratory variability, suggesting right atrial pressure of 15 mmHg.  Additional Comments: Severe global reduction in LV systolic function; grade 2 diastolic dysfunction; moderate LVH; severe RV dysfunction.  LEFT VENTRICLE PLAX 2D LVIDd:         4.70 cm  Diastology LVIDs:         4.50 cm  LV e' lateral:   4.03 cm/s LV PW:         1.40 cm  LV E/e' lateral: 18.5 LV IVS:        1.20 cm  LV e' medial:    4.79 cm/s LVOT diam:     1.90 cm  LV E/e' medial:  15.6 LV SV:         27 LV SV Index:   15 LVOT Area:     2.84 cm  LEFT ATRIUM           Index LA diam:      3.80 cm 2.05 cm/m LA Vol (A4C): 53.7 ml 29.01 ml/m  AORTIC VALVE LVOT Vmax:   69.80 cm/s LVOT Vmean:  46.000 cm/s LVOT VTI:    0.097 m   AORTA Ao Root diam: 3.10 cm MITRAL VALVE               TRICUSPID VALVE MV Area (PHT): 5.31 cm    TR Peak grad:   30.7 mmHg MV Decel Time: 143 msec    TR Vmax:        277.00 cm/s MV E velocity: 74.60 cm/s MV A velocity: 43.70 cm/s  SHUNTS MV E/A ratio:  1.71        Systemic VTI:  0.10 m                            Systemic Diam: 1.90 cm Kirk Ruths MD Electronically signed by Kirk Ruths MD Signature Date/Time: 09/30/2019/3:38:24 PM    Final     Cardiac Studies   Right/Left Heart Catheterization 03/29/2019: 1. No obstructive CAD, nonischemic cardiomyopathy.  2. R > L heart failure with low PAPI suggesting significant RV dysfunction.  3. Primarily pulmonary venous hypertension.  4. Low cardiac output, CI 2.07.    Patient will need ongoing diuresis, will start digoxin and Lasix gtt.  She will need to start warfarin with heparin bridge.  _______________  Echocardiogram 09/30/2019: Impressions:  1. Severe global reduction in LV systolic function; grade 2 diastolic  dysfunction; moderate LVH; severe RV dysfunction.   2. Left ventricular ejection fraction, by estimation, is <20%. The left  ventricle has severely decreased function. The left ventricle demonstrates  global hypokinesis. There is moderate left ventricular hypertrophy. Left  ventricular diastolic parameters  are consistent with Grade II diastolic dysfunction (pseudonormalization).  Elevated left atrial pressure.   3. Right ventricular systolic function is severely reduced. The right  ventricular size is normal. There is moderately elevated pulmonary artery  systolic pressure.   4. The mitral valve is normal in structure. Trivial mitral valve  regurgitation. No evidence of mitral stenosis.   5. The aortic valve is tricuspid. Aortic valve regurgitation is not  visualized. No aortic stenosis is present.   6. The inferior vena cava is dilated in size with <50% respiratory  variability, suggesting right atrial pressure of 15 mmHg.    Patient Profile   Ms. Bilski is a 44 y.o. female with a history of no significant CAD on cardiac catheterization in 11/3974, chronic systolic CHF/non-ischemic cardiomyopathy with EF of 20-25% on Echo in 03/2019, paroxysmal atrial fibrillation on Coumadin, poorly controlled hypertension, hyperlipidemia, poorly controlled type 2 diabetes mellitus, and morbid obesity who is being seen today for the evaluation of acute on chronic CHF at the request of Dr. Alcario Drought.  Assessment & Plan    Acute on Chronic Combined CHF with Biventricular Failure (R > L) - Patient presented with worsening dyspnea on exertion, orthpnea, PND, lower extremity edema, and a 40 lb weight loss.  - BNP elevated at 562.9. - No overt edema on chest x-ray. - Echo this admission showed LVEF of <20% with moderate LVH and grade 2 diastolic dysfunction. Also noted to have severe RV dysfunction with moderately elevated PASP.  - Patient currently on IV Lasix 60m twice daily.  Documented urinary output of 1.7 L in the past 24 hours and net negative 3.6 L since admission. No weight documented today. Weighed 200 lb yesterday down from 220 lbs on admission. Patient states dry weight around 165 lbs. At last office visit with PCP in 07/2019, weighed 186 lbs. Creatinine stable but BUN starting to rise.  - Continue current dose of Lasix for now. - Continue Entresto 49-549mtwice daily, Coreg 12.58m11mwice daily, Spironolactone 258m31mily, and Digoxin 0.1258mg46mly. - Low albumin also likely contributing to volume overload.  - Short run of non-sustained VT noted on telemetry. Electrolytes stable. Given continued reduced EF of <20, may need to have discussions of ICD. Will discuss with MD. - Continue to monitor daily weights, strict I/O's, and renal function.   Demand Ischemia - Patient also reports worsening chest pain both at rest and with exertion. She describes chest pain as sharp pain and a tightness.  - High sensitivity-troponin minimally  elevated at 76 >> 100 >> 83. - No acute changes on EKG. - LHC in 03/2019 showed no significant CAD. - Suspect demand ischemia secondary to acute on chronic CHF. No additional ischemic work-up needed at this time.   Paroxysmal Atrial Fibrillation - Maintaining sinus rhythm. Rate controlled. - Short run of non-sustained VT noted on telemetry. Magnesium 2.1 and Potassium 3.9 today. - Continue Coreg and Digoxin as above. - Continue chronic anticoagulation with Coumadin. INR subtherapeutic at 1.3. Dosing per Pharmacy.    Apical Thrombus - Echo in 03/2019 showed apical thrombus. Repeat Echo this admission does not show thrombus. - Continue Coumadin  per Pharmacy.    Hypertension - BP markedly elevated on arrival with BP as high as 195/156. Patient has been out of some of her medications for the last week but could not tell me which ones. BP well controlled since restarting meds.  - Continue Entresto, Coreg, and Spironolactone as above.   Hyperlipidemia - Continue home Lipitor 47m daily.   Type 2 Diabetes Mellitus - Poorly Controlled - Hemoglobin A1c 11.1. - Management per primary team.  Abdominal Pain - Nausea and vomiting have improved but patient reports continued abdominal pain.  - Hepatic function panel on 3/8 revealed AST of 55, ALT 43, Alk Phos 152, Total Bili 2.3, Direct Bili 0.7, and Indirect Bili 1.6.  - Hepatic congestion may be contributing. - Will defer any additional work-up to primary team.      For questions or updates, please contact CMound StationPlease consult [email or web address] for contact info under        Signed, CDarreld Mclean PA-C  10/02/2019, 7:29 AM    Patient examined chart reviewed She continues to make slow progress with over liter diuresis and weight down. Non cardiac chest pain known normal coronary arteries In NSR pharmacy dosing coumadin no apical thrombus on current echo.   PJenkins Rouge

## 2019-10-02 NOTE — Progress Notes (Signed)
PROGRESS NOTE  [name] [id number] DOB: May 20, 1976 DOA: 09/29/2019 PCP: [name], DO   LOS: 2 days   Brief narrative:  [name] a 44 y.o.femalewith medical history significant ofHFrEF with 20-25% EF, PAF on coumadin, DM2, HTN presented to the hospital with complaints of chest pain and shortness of breath for last 1 month.  Patient was not taking her Lasix.  Has had recurrent visits to the emergency department.  CT angiogram showed right-sided heart failure  BNP 562.9, Trops of 76 and 100.  Patient did have dyspnea on exertion.  In the ED patient was noted to have elevated blood pressure and was admitted to the hospital for hypertensive emergency.  Cardiology was consulted.  Assessment/Plan:  Principal Problem:   Hypertensive urgency Active Problems:   DM2 (diabetes mellitus, type 2) (HCC)   HFrEF (heart failure with reduced ejection fraction) (HCC)   Paroxysmal atrial fibrillation (HCC)   Acute on chronic systolic CHF (congestive heart failure) (HCC)   Elevated troponin   Hypertensive emergency  Hhypertensive emergency with chest pain and elevated troponin.   thought to be secondary to noncompliance.  History of recurrent ED visits.  Parameters have improved with resumption of home medication including Lasix.  Elevated troponin likely secondary to demand ischemia Cardiology on board.  No further work-up planned as per cardiology.  Acute on chronic combined systolic and diastolic CHF 2D echo abdominal 09/30/2019 showed LVEF less than 20% with grade 2 diastolic dysfunction, severe RV dysfunction and LV global hypokinesis.  Cardiology on board.  On IV Lasix 80 mg twice a day.  Continue diuresis.  Strict intake and output charting.  Fluid restriction.  Continue Coreg digoxin.  Patient is negative balance 4378 0 mL so far.  Type 2 diabetes with hyperglycemia Hemoglobin A1c 11.1.  Slightly improved today.  Has been started on Lantus 20 units and sliding scale  insulin.  Will closely monitor blood glucose levels.  Patient is on Jardiance at home.  Will likely need Lantus prescription on discharge.  Hypokalemia We will continue to replenish as needed.  Potassium of 3.9 today.  Received magnesium sulfate yesterday.  Check BMP in a.m.   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  Continue Coumadin.  INR subtherapeutic at 1.2.  Pharmacy to manage.  Medication noncompliance Educated on importance of compliance  Mmorbid obesity BMI 40.  Hyperlipidemia Continue Lipitor   VTE Prophylaxis: Coumadin  Code Status: Full code  Family Communication: None  Disposition Plan:   Patient is from home  Likely disposition to home likely 2 to 3 days  Barriers to discharge: Decompensated congestive heart failure on high-dose IV diuretics   Consultants:  Cardiology  Procedures:  None  Antibiotics:   None  Anti-infectives (From admission, onward)   None      Subjective: Today, patient was seen and examined at bedside.  Patient denies overt chest pain but has mild shortness of breath.  Still feels very fatigued and weak.  Still feels that her lower extremity edema has not improved and has had significant weight gain. Objective: Vitals:   10/01/19 2100 10/02/19 0634  BP: 115/76 117/90  Pulse: 79 75  Resp:  18  Temp:  (!) 97.4 F (36.3 C)  SpO2:  100%    Intake/Output Summary (Last 24 hours) at 10/02/2019 1109 Last data filed at 10/02/2019 1031 Gross per 24 hour  Intake 770 ml  Output 2100 ml  Net -1330 ml   [employer]  09/29/19 1806 09/30/19 0600 10/01/19 0600  Weight: 100 kg 91.5 kg 90.8 kg   Body mass index is 40.43 kg/m.   Physical Exam: GENERAL: Patient is alert awake and oriented. Not in obvious distress.  Morbidly obese HENT: No scleral pallor or icterus. Pupils equally reactive to light. Oral mucosa is moist NECK: is supple, no gross swelling noted. CHEST  Diminished breath  sounds bilaterally.  Crackles noted. CVS: S1 and S2 heard, no murmur. Regular rate and rhythm.  ABDOMEN: Soft, non-tender, bowel sounds are present. EXTREMITIES: Trace bilateral lower extremity pitting edema noted CNS: Cranial nerves are intact. No focal motor deficits. SKIN: warm and dry without rashes.  Data Review: I have personally reviewed the following laboratory data and studies,  CBC: Recent Labs  Lab 09/29/19 2109  WBC 11.4*  NEUTROABS 7.7  HGB 12.0  HCT 38.6  MCV 78.1*  PLT [id number]   Basic Metabolic Panel: Recent Labs  Lab 09/29/19 2109 10/01/19 0424 10/02/19 0518  NA 130* 133* 134*  K 4.7 3.7 3.9  CL 95* 96* 98  CO2 24 29 30   GLUCOSE 395* 245* 176*  BUN 19 20 24*  CREATININE 0.93 0.85 0.93  CALCIUM 9.0 8.3* 8.2*  MG  --   --  2.1   Liver Function Tests: Recent Labs  Lab 09/30/19 0600  AST 55*  ALT 43  ALKPHOS 152*  BILITOT 2.3*  PROT 7.0  ALBUMIN 2.7*   No results for input(s): LIPASE, AMYLASE in the last 168 hours. No results for input(s): AMMONIA in the last 168 hours. Cardiac Enzymes: No results for input(s): CKTOTAL, CKMB, CKMBINDEX, TROPONINI in the last 168 hours. BNP (last 3 results) Recent Labs    09/12/19 0025 09/25/19 0148 09/29/19 2109  BNP 649.9* 277.8* 562.9*    ProBNP (last 3 results) No results for input(s): PROBNP in the last 8760 hours.  CBG: Recent Labs  Lab 10/01/19 0819 10/01/19 1146 10/01/19 1617 10/01/19 2052 10/02/19 0744  GLUCAP 230* 357* 174* 209* 187*   Recent Results (from the past 240 hour(s))  SARS CORONAVIRUS 2 (TAT 6-24 HRS) Nasopharyngeal Nasopharyngeal Swab     Status: None   Collection Time: 09/30/19  6:04 AM   Specimen: Nasopharyngeal Swab  Result Value Ref Range Status   SARS Coronavirus 2 NEGATIVE NEGATIVE Final    Comment: (NOTE) SARS-CoV-2 target nucleic acids are NOT DETECTED. The SARS-CoV-2 RNA is generally detectable in upper and lower respiratory specimens during the acute phase of  infection. Negative results do not preclude SARS-CoV-2 infection, do not rule out co-infections with other pathogens, and should not be used as the sole basis for treatment or other patient management decisions. Negative results must be combined with clinical observations, patient history, and epidemiological information. The expected result is Negative. Fact Sheet for Patients: SugarRoll.be Fact Sheet for Healthcare Providers: https://www.woods-mathews.com/ This test is not yet approved or cleared by the [place] FDA and  has been authorized for detection and/or diagnosis of SARS-CoV-2 by FDA under an Emergency Use Authorization (EUA). This EUA will remain  in effect (meaning this test can be used) for the duration of the COVID-19 declaration under Section 56 4(b)(1) of the Act, 21 U.S.C. section 360bbb-3(b)(1), unless the authorization is terminated or revoked sooner. Performed at [facility], [place] [address], [place], [place] [address]      Studies: ECHOCARDIOGRAM COMPLETE  Result Date: 09/30/2019    ECHOCARDIOGRAM REPORT   Patient Name:   [name] Date of Exam:  09/30/2019 Medical Rec #:  [id number]       Height:       59.0 in Accession #:    [id number]      Weight:       201.7 lb Date of Birth:  1976-05-15        BSA:          1.851 m Patient Age:    62 years        BP:           116/81 mmHg Patient Gender: F               HR:           80 bpm. Exam Location:  Inpatient Procedure: 2D Echo Indications:    428.21 CHF  History:        Patient has prior history of Echocardiogram examinations, most                 recent 03/28/2019. CHF, Arrythmias:Atrial Fibrillation; Risk                 Factors:Hypertension and Diabetes.  Sonographer:    Jannett Celestine RDCS (AE) Referring Phys: [place]  1. Severe global reduction in LV systolic function; grade 2 diastolic dysfunction; moderate LVH; severe RV dysfunction.  2. Left  ventricular ejection fraction, by estimation, is <20%. The left ventricle has severely decreased function. The left ventricle demonstrates global hypokinesis. There is moderate left ventricular hypertrophy. Left ventricular diastolic parameters are consistent with Grade II diastolic dysfunction (pseudonormalization). Elevated left atrial pressure.  3. Right ventricular systolic function is severely reduced. The right ventricular size is normal. There is moderately elevated pulmonary artery systolic pressure.  4. The mitral valve is normal in structure. Trivial mitral valve regurgitation. No evidence of mitral stenosis.  5. The aortic valve is tricuspid. Aortic valve regurgitation is not visualized. No aortic stenosis is present.  6. The inferior vena cava is dilated in size with <50% respiratory variability, suggesting right atrial pressure of 15 mmHg. FINDINGS  Left Ventricle: Left ventricular ejection fraction, by estimation, is <20%. The left ventricle has severely decreased function. The left ventricle demonstrates global hypokinesis. The left ventricular internal cavity size was normal in size. There is moderate left ventricular hypertrophy. Left ventricular diastolic parameters are consistent with Grade II diastolic dysfunction (pseudonormalization). Elevated left atrial pressure. Right Ventricle: The right ventricular size is normal. Right ventricular systolic function is severely reduced. There is moderately elevated pulmonary artery systolic pressure. The tricuspid regurgitant velocity is 2.77 m/s, and with an assumed right atrial pressure of 15 mmHg, the estimated right ventricular systolic pressure is [id number] mmHg. Left Atrium: Left atrial size was normal in size. Right Atrium: Right atrial size was normal in size. Pericardium: Trivial pericardial effusion is present. Mitral Valve: The mitral valve is normal in structure. Normal mobility of the mitral valve leaflets. Trivial mitral valve regurgitation. No  evidence of mitral valve stenosis. Tricuspid Valve: The tricuspid valve is normal in structure. Tricuspid valve regurgitation is mild . No evidence of tricuspid stenosis. Aortic Valve: The aortic valve is tricuspid. Aortic valve regurgitation is not visualized. No aortic stenosis is present. Pulmonic Valve: The pulmonic valve was normal in structure. Pulmonic valve regurgitation is trivial. No evidence of pulmonic stenosis. Aorta: The aortic root is normal in size and structure. Venous: The inferior vena cava is dilated in size with less than 50% respiratory variability, suggesting right atrial pressure of 15 mmHg.  Additional Comments: Severe global reduction in LV systolic function; grade 2 diastolic dysfunction; moderate LVH; severe RV dysfunction.  LEFT VENTRICLE PLAX 2D LVIDd:         4.70 cm  Diastology LVIDs:         4.50 cm  LV e' lateral:   4.03 cm/s LV PW:         1.40 cm  LV E/e' lateral: 18.5 LV IVS:        1.20 cm  LV e' medial:    4.79 cm/s LVOT diam:     1.90 cm  LV E/e' medial:  15.6 LV SV:         27 LV SV Index:   15 LVOT Area:     2.84 cm  LEFT ATRIUM           Index LA diam:      3.80 cm 2.05 cm/m LA Vol (A4C): 53.7 ml 29.01 ml/m  AORTIC VALVE LVOT Vmax:   69.80 cm/s LVOT Vmean:  46.000 cm/s LVOT VTI:    0.097 m  AORTA Ao Root diam: 3.10 cm MITRAL VALVE               TRICUSPID VALVE MV Area (PHT): 5.31 cm    TR Peak grad:   30.7 mmHg MV Decel Time: 143 msec    TR Vmax:        277.00 cm/s MV E velocity: 74.60 cm/s MV A velocity: 43.70 cm/s  SHUNTS MV E/A ratio:  1.71        Systemic VTI:  0.10 m                            Systemic Diam: 1.90 cm Kirk Ruths MD Electronically signed by Kirk Ruths MD Signature Date/Time: 09/30/2019/3:38:24 PM    Final       Flora Lipps, MD  [facility] 10/02/2019

## 2019-10-02 NOTE — Progress Notes (Signed)
Inpatient Diabetes Program Recommendations  AACE/ADA: New Consensus Statement on Inpatient Glycemic Control (2015)  Target Ranges:  Prepandial:   less than 140 mg/dL      Peak postprandial:   less than 180 mg/dL (1-2 hours)      Critically ill patients:  140 - 180 mg/dL   Lab Results  Component Value Date   GLUCAP 263 (H) 10/02/2019   HGBA1C 11.1 (H) 09/30/2019    Review of Glycemic Control Results for [name], [name] (MRN [id number]) as of 10/02/2019 12:03  Ref. Range 10/01/2019 11:46 10/01/2019 16:17 10/01/2019 20:52 10/02/2019 07:44 10/02/2019 11:26  Glucose-Capillary Latest Ref Range: 70 - 99 mg/dL 357 (H) 174 (H) 209 (H) 187 (H) 263 (H)  Diabetes history:DM Outpatient Diabetes medications: Jardiance 25 mg daily, Current orders for Inpatient glycemic control: Novolog moderate tid with meals and HS Lantus 20 units daily  Inpatient Diabetes Program Recommendations:    Consider adding Novolog meal coverage 3 units tid with meals (hold if patient eats less than 50%).   Thanks  Adah Perl, RN, BC-ADM Inpatient Diabetes Coordinator Pager [phone] (8a-5p)

## 2019-10-03 DIAGNOSIS — I509 Heart failure, unspecified: Secondary | ICD-10-CM

## 2019-10-03 LAB — BASIC METABOLIC PANEL
Anion gap: 9 (ref 5–15)
BUN: 23 mg/dL — ABNORMAL HIGH (ref 6–20)
CO2: 29 mmol/L (ref 22–32)
Calcium: 8.2 mg/dL — ABNORMAL LOW (ref 8.9–10.3)
Chloride: 97 mmol/L — ABNORMAL LOW (ref 98–111)
Creatinine, Ser: 0.93 mg/dL (ref 0.44–1.00)
GFR calc Af Amer: >60 mL/min (ref >60)
GFR calc non Af Amer: >60 mL/min (ref >60)
Glucose, Bld: 210 mg/dL — ABNORMAL HIGH (ref 70–99)
Potassium: 4.2 mmol/L (ref 3.5–5.1)
Sodium: 135 mmol/L (ref 135–145)

## 2019-10-03 LAB — MAGNESIUM: Magnesium: 1.9 mg/dL (ref 1.7–2.4)

## 2019-10-03 LAB — CBC
HCT: 38.3 % (ref 36.0–46.0)
Hemoglobin: 11.5 g/dL — ABNORMAL LOW (ref 12.0–15.0)
MCH: 24.1 pg — ABNORMAL LOW (ref 26.0–34.0)
MCHC: 30 g/dL (ref 30.0–36.0)
MCV: 80.1 fL (ref 80.0–100.0)
Platelets: 326 10*3/uL (ref 150–400)
RBC: 4.78 MIL/uL (ref 3.87–5.11)
RDW: 14.5 % (ref 11.5–15.5)
WBC: 9.6 10*3/uL (ref 4.0–10.5)
nRBC: 0 % (ref 0.0–0.2)

## 2019-10-03 LAB — GLUCOSE, CAPILLARY
Glucose-Capillary: 206 mg/dL — ABNORMAL HIGH (ref 70–99)
Glucose-Capillary: 204 mg/dL — ABNORMAL HIGH (ref 70–99)
Glucose-Capillary: 130 mg/dL — ABNORMAL HIGH (ref 70–99)
Glucose-Capillary: 180 mg/dL — ABNORMAL HIGH (ref 70–99)

## 2019-10-03 LAB — PROTIME-INR
INR: 1.5 — ABNORMAL HIGH (ref 0.8–1.2)
Prothrombin Time: 18.2 seconds — ABNORMAL HIGH (ref 11.4–15.2)

## 2019-10-03 MED ORDER — EMPAGLIFLOZIN 25 MG PO TABS: 25.0000 mg | ORAL_TABLET | Freq: Every day | ORAL | 3 refills | Status: DC

## 2019-10-03 MED ORDER — WARFARIN SODIUM 4 MG PO TABS
8.0000 mg | ORAL_TABLET | Freq: Once | ORAL | Status: AC
  Administered 2019-10-03: 8 mg via ORAL
  Filled 2019-10-03: qty 2

## 2019-10-03 MED ORDER — WARFARIN SODIUM 7.5 MG PO TABS: 7.5000 mg | ORAL_TABLET | Freq: Every day | ORAL | 3 refills | Status: DC

## 2019-10-03 MED ORDER — CARVEDILOL 6.25 MG PO TABS: 12.5000 mg | ORAL_TABLET | Freq: Two times a day (BID) | ORAL | 3 refills | Status: DC

## 2019-10-03 MED ORDER — ATORVASTATIN CALCIUM 40 MG PO TABS: 40.0000 mg | ORAL_TABLET | Freq: Every day | ORAL | 3 refills | Status: DC

## 2019-10-03 MED ORDER — GABAPENTIN 100 MG PO CAPS: 100.0000 mg | ORAL_CAPSULE | Freq: Three times a day (TID) | ORAL | 0 refills | Status: DC

## 2019-10-03 MED ORDER — ALBUTEROL SULFATE HFA 108 (90 BASE) MCG/ACT IN AERS: 2.0000 | INHALATION_SPRAY | Freq: Four times a day (QID) | RESPIRATORY_TRACT | 1 refills | Status: DC | PRN

## 2019-10-03 MED ORDER — INSULIN GLARGINE 100 UNIT/ML ~~LOC~~ SOLN
22.0000 [IU] | SUBCUTANEOUS | Status: DC
  Administered 2019-10-03: 22 [IU] via SUBCUTANEOUS
  Filled 2019-10-03 (×2): qty 0.22

## 2019-10-03 MED ORDER — DIGOXIN 125 MCG PO TABS: 0.1250 mg | ORAL_TABLET | Freq: Every day | ORAL | 3 refills | Status: DC

## 2019-10-03 MED ORDER — SPIRONOLACTONE 25 MG PO TABS: 25.0000 mg | ORAL_TABLET | Freq: Every day | ORAL | 3 refills | Status: DC

## 2019-10-03 MED ORDER — ENTRESTO 49-51 MG PO TABS: 1.0000 | ORAL_TABLET | Freq: Two times a day (BID) | ORAL | 3 refills | Status: DC

## 2019-10-03 MED ORDER — TORSEMIDE 20 MG PO TABS
40.0000 mg | ORAL_TABLET | Freq: Every day | ORAL | Status: DC
  Administered 2019-10-03 – 2019-10-04 (×2): 40 mg via ORAL
  Filled 2019-10-03 (×2): qty 2

## 2019-10-03 MED ORDER — TRAZODONE HCL 50 MG PO TABS: 50.0000 mg | ORAL_TABLET | Freq: Every evening | ORAL | 0 refills | Status: DC | PRN

## 2019-10-03 MED FILL — ?CARVEDILOL 6.25 MG TABLET: 6.25 | 15 days supply | Qty: 60 | Fill #0

## 2019-10-03 MED FILL — ?ATORVASTATIN 40MG TABLET: 40 | 30 days supply | Qty: 30 | Fill #0

## 2019-10-03 MED FILL — DIGITEK 125 MCG TABLET: 125 | 30 days supply | Qty: 30 | Fill #0

## 2019-10-03 MED FILL — ALBUTEROL SULFATE HFA 108 (: 108 (90 BAS | 25 days supply | Qty: 18 | Fill #0

## 2019-10-03 MED FILL — GABAPENTIN 100 MG CAPSULE: 100 | 30 days supply | Qty: 90 | Fill #0

## 2019-10-03 MED FILL — WARFARIN SODIUM 7.5 MG TAB: 7.5 | 30 days supply | Qty: 30 | Fill #0

## 2019-10-03 MED FILL — ?TRAZODONE HCL 50 TABS: 50 | 30 days supply | Qty: 30 | Fill #0

## 2019-10-03 MED FILL — ?SPIRONOLACTONE 25 MG TABLE: 25 | 30 days supply | Qty: 30 | Fill #0

## 2019-10-03 MED FILL — **JARDIANCE 25 MG TABLET: 25 | 7 days supply | Qty: 7 | Fill #0

## 2019-10-03 MED FILL — **ENTRESTO 49-51 MG TABLET: 49-51 | 14 days supply | Qty: 28 | Fill #0

## 2019-10-03 NOTE — Progress Notes (Signed)
[name] for Warfarin Indication: atrial fibrillation  Allergies  Allergen Reactions  . Shellfish Allergy Swelling    Airway involvement including EMS - Has Epi-Pen  . Dulaglutide Nausea And Vomiting and Other (See Comments)    Multiple episodes of vomiting over multiple days  . Metformin And Related Diarrhea    Patient Measurements: Height: 4\' 11"  (149.9 cm) Weight: 190 lb 11.2 oz (86.5 kg) IBW/kg (Calculated) : 43.2  Vital Signs: Temp: 98.4 F (36.9 C) (03/11 0623) Temp Source: Oral (03/11 0623) BP: 108/91 (03/11 0623) Pulse Rate: 80 (03/11 0623)  Labs: Recent Labs    10/01/19 0424 10/02/19 0518 10/03/19 0435  HGB  --   --  11.5*  HCT  --   --  38.3  PLT  --   --  326  LABPROT 15.7* 15.4* 18.2*  INR 1.3* 1.2 1.5*  CREATININE 0.85 0.93 0.93    Estimated Creatinine Clearance: 76 mL/min (by C-G formula based on SCr of 0.93 mg/dL).   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   Home dose warfarin: 7.5 mg daily.  Last dose: unk date/time INR = 1.3 on admission  Today, 10/02/2019:  CBC: stable, WNL  INR (1.5) remains SUBtherapeutic despite several boosted doses; however, it is expected to take several days to increase after missing several days of warfarin PTA. INR starting to increase  Major drug interactions: none noted  No bleeding issues documented  Eating 50% of meals (decreased from earlier in admission)  HF exacerbation can make pt more sensitive to effects of warfarin  Goal of Therapy: INR 2-3  Plan:   Warfarin 8 mg x1 this evening  Daily INR  Monitor for signs of bleeding or worsening thrombosis  CBC q72h  Lenis Noon, PharmD 10/03/2019,7:56 AM

## 2019-10-03 NOTE — Progress Notes (Addendum)
PROGRESS NOTE  [name] [id number] DOB: 1976/04/29 DOA: 09/29/2019 PCP: [name], DO   LOS: 3 days   Brief narrative:  [name] a 44 y.o.femalewith medical history significant ofHFrEF with 20-25% EF, PAF on coumadin, DM2, HTN presented to the hospital with complaints of chest pain and shortness of breath for last 1 month.  Patient was not taking her Lasix.  Has had recurrent visits to the emergency department.  CT angiogram showed right-sided heart failure  BNP 562.9, Trops of 76 and 100.  Patient did have dyspnea on exertion.  In the ED patient was noted to have elevated blood pressure and was admitted to the hospital for hypertensive emergency.  Cardiology was consulted.  Assessment/Plan:  Principal Problem:   Hypertensive urgency Active Problems:   DM2 (diabetes mellitus, type 2) (HCC)   HFrEF (heart failure with reduced ejection fraction) (HCC)   Paroxysmal atrial fibrillation (HCC)   Acute on chronic systolic CHF (congestive heart failure) (HCC)   Elevated troponin   Hypertensive emergency   Acute on chronic congestive heart failure ([place])  Hhypertensive emergency with chest pain and elevated troponin.   thought to be secondary to noncompliance.  Elevated troponin thought to be secondary to demand ischemia.  History of recurrent ED visits.   improved with resumption of home medications including Lasix.  No further work-up planned as per cardiology.  Acute on chronic combined systolic and diastolic CHF 2D echo abdominal 09/30/2019 showed LVEF less than 20% with grade 2 diastolic dysfunction, severe RV dysfunction and LV global hypokinesis.  Cardiology on board.  On IV Lasix 80 mg iv twice a day.  Continue diuresis.  Strict intake and output charting.  Fluid restriction.  Continue Coreg, digoxin.  Patient is negative balance 6040 mL so far with weight of 190.  Baseline-dry weight of 187.  Spoke with cardiology.  Will likely switch to oral diuretics..  Type 2  diabetes with hyperglycemia Hemoglobin A1c 11.1. on Lantus 20 units, NovoLog with meals and sliding scale insulin.  Will closely monitor blood glucose levels.  Patient is on Jardiance at home.  Will likely need Lantus prescription on discharge.  Will increase Lantus to 22 units.  Hypokalemia Improved.  Latest potassium of 4.2.  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  Continue Coumadin.  INR subtherapeutic at 1.5.  Pharmacy managing.  Patient was advised the importance of compliance.  Medication noncompliance Educated on importance of compliance  Mmorbid obesity BMI 40.  Hyperlipidemia Continue Lipitor   VTE Prophylaxis: Coumadin  Code Status: Full code  Family Communication: None  Disposition Plan:  . Patient is from home . Likely disposition to home likely in 1 to 2 days. . Barriers to discharge: Decompensated congestive heart failure on high-dose IV diuretics, volume overload.  [facility] for [facility]   Consultants:  Cardiology  Procedures:  None  Antibiotics:  . None  Anti-infectives (From admission, onward)   None      Subjective: Today, patient complains of fatigue, weakness and mild dizziness..  Denies dyspnea or chest pain.  Still complains of lower extremity edema.  Objective: Vitals:   10/02/19 2236 10/03/19 0623  BP: 111/80 (!) 108/91  Pulse: 81 80  Resp:  18  Temp:  98.4 F (36.9 C)  SpO2:  97%    Intake/Output Summary (Last 24 hours) at 10/03/2019 1127 Last data filed at 10/03/2019 1100 Gross per 24 hour  Intake 840 ml  Output 2700 ml  Net -1860 ml   Filed Weights   09/30/19 0600 10/01/19 0600 10/03/19 0626  Weight: 91.5 kg 90.8 kg 86.5 kg   Body mass index is 38.52 kg/m.   Physical Exam:  GENERAL: Patient is alert awake and oriented. Not in obvious distress.  Morbidly obese HENT: No scleral pallor or icterus. Pupils equally reactive to light.  Oral mucosa is moist NECK: is supple, no gross swelling noted. CHEST  Diminished breath sounds bilaterally.  CVS: S1 and S2 heard, no murmur. Regular rate and rhythm.  ABDOMEN: Soft, non-tender, bowel sounds are present. EXTREMITIES: Trace bilateral lower extremity pitting edema noted CNS: Cranial nerves are intact. No focal motor deficits. SKIN: warm and dry without rashes.  Data Review: I have personally reviewed the following laboratory data and studies,  CBC: Recent Labs  Lab 09/29/19 2109 10/03/19 0435  WBC 11.4* 9.6  NEUTROABS 7.7  --   HGB 12.0 11.5*  HCT 38.6 38.3  MCV 78.1* 80.1  PLT 400 [id number]   Basic Metabolic Panel: Recent Labs  Lab 09/29/19 2109 10/01/19 0424 10/02/19 0518 10/03/19 0435  NA 130* 133* 134* 135  K 4.7 3.7 3.9 4.2  CL 95* 96* 98 97*  CO2 24 29 30 29   GLUCOSE 395* 245* 176* 210*  BUN 19 20 24* 23*  CREATININE 0.93 0.85 0.93 0.93  CALCIUM 9.0 8.3* 8.2* 8.2*  MG  --   --  2.1 1.9   Liver Function Tests: Recent Labs  Lab 09/30/19 0600  AST 55*  ALT 43  ALKPHOS 152*  BILITOT 2.3*  PROT 7.0  ALBUMIN 2.7*   No results for input(s): LIPASE, AMYLASE in the last 168 hours. No results for input(s): AMMONIA in the last 168 hours. Cardiac Enzymes: No results for input(s): CKTOTAL, CKMB, CKMBINDEX, TROPONINI in the last 168 hours. BNP (last 3 results) Recent Labs    09/12/19 0025 09/25/19 0148 09/29/19 2109  BNP 649.9* 277.8* 562.9*    ProBNP (last 3 results) No results for input(s): PROBNP in the last 8760 hours.  CBG: Recent Labs  Lab 10/02/19 0744 10/02/19 1126 10/02/19 1716 10/02/19 2047 10/03/19 0753  GLUCAP 187* 263* 190* 199* 206*   Recent Results (from the past 240 hour(s))  SARS CORONAVIRUS 2 (TAT 6-24 HRS) Nasopharyngeal Nasopharyngeal Swab     Status: None   Collection Time: 09/30/19  6:04 AM   Specimen: Nasopharyngeal Swab  Result Value Ref Range Status   SARS Coronavirus 2 NEGATIVE NEGATIVE Final    Comment:  (NOTE) SARS-CoV-2 target nucleic acids are NOT DETECTED. The SARS-CoV-2 RNA is generally detectable in upper and lower respiratory specimens during the acute phase of infection. Negative results do not preclude SARS-CoV-2 infection, do not rule out co-infections with other pathogens, and should not be used as the sole basis for treatment or other patient management decisions. Negative results must be combined with clinical observations, patient history, and epidemiological information. The expected result is Negative. Fact Sheet for Patients: SugarRoll.be Fact Sheet for Healthcare Providers: https://www.woods-mathews.com/ This test is not yet approved or cleared by the [place] FDA and  has been authorized for detection and/or diagnosis of SARS-CoV-2 by FDA under an Emergency Use Authorization (EUA). This EUA will remain  in effect (meaning this test can be used) for the duration of the COVID-19 declaration under Section 56 4(b)(1) of the Act, 21 U.S.C. section 360bbb-3(b)(1), unless the authorization is terminated or revoked sooner. Performed at [facility], [place]  [address] [place], [place] [address]      Studies: No results found.    Flora Lipps, MD  [facility] 10/03/2019

## 2019-10-03 NOTE — Progress Notes (Signed)
Progress Note  Patient Name: [name] Date of Encounter: 10/03/2019  Primary Cardiologist: Tammy Champagne, MD   Subjective   No significant overnight events. She feels like breathing continues to improve and thinks she is almost back to her baseline. She continues to have intermittent chest pain. She states it is worse if she takes a deep breath or moves around too much. She continues to have abdominal pain but nausea has improved. She also reports bilateral leg pain this morning. No unilateral swelling, redness, or signs of DVT.  Inpatient Medications    Scheduled Meds: . atorvastatin  40 mg Oral q1800  . carvedilol  12.5 mg Oral BID WC  . digoxin  0.125 mg Oral Daily  . furosemide  80 mg Intravenous BID  . gabapentin  100 mg Oral Q8H  . insulin aspart  0-15 Units Subcutaneous TID WC  . insulin aspart  0-5 Units Subcutaneous QHS  . insulin aspart  3 Units Subcutaneous TID WC  . insulin glargine  20 Units Subcutaneous Q24H  . potassium chloride  30 mEq Oral Daily  . sacubitril-valsartan  1 tablet Oral BID  . sodium chloride flush  3 mL Intravenous Q12H  . spironolactone  25 mg Oral Daily  . Warfarin - Pharmacist Dosing Inpatient   Does not apply q1800   Continuous Infusions: . sodium chloride     PRN Meds: sodium chloride, acetaminophen, albuterol, labetalol, meclizine, methocarbamol, ondansetron (ZOFRAN) IV, sodium chloride flush, traMADol, traZODone   Vital Signs    Vitals:   10/02/19 2046 10/02/19 2236 10/03/19 0623 10/03/19 0626  BP: 96/66 111/80 (!) 108/91   Pulse: 79 81 80   Resp: 20  18   Temp: 98.2 F (36.8 C)  98.4 F (36.9 C)   TempSrc: Oral  Oral   SpO2: 100%  97%   Weight:    86.5 kg  Height:        Intake/Output Summary (Last 24 hours) at 10/03/2019 0710 Last data filed at 10/03/2019 0400 Gross per 24 hour  Intake 1080 ml  Output 2600 ml  Net -1520 ml   Last 3 Weights 10/03/2019 10/01/2019 09/30/2019  Weight (lbs) 190 lb 11.2 oz 200 lb 2.8 oz  201 lb 11.5 oz  Weight (kg) 86.501 kg 90.8 kg 91.5 kg      Telemetry    Normal sinus rhythm with rate in the 80's. - Personally Reviewed  ECG    No new ECG tracing today. - Personally Reviewed  Physical Exam   QPR:[id number] obese African-American female in no acute distress.   Neck:Supple.  Cardiac:RRR. No significant murmurs, gallops, or rubs appreciated. Respiratory:No increased work of breath. Lungs clear to auscultation bilaterally.  ZL:DJTT, obese, and mildly tender to palpation. Bowel sounds present. SV:XBLTJ pitting edema of bilateral lower extremities. Skin: Warm and dry.  Neuro:No focal deficits.  Psych: Normal affect. Responds appropriately.   Labs    High Sensitivity Troponin:   Recent Labs  Lab 09/24/19 2238 09/25/19 0035 09/30/19 0018 09/30/19 0159 09/30/19 0600  TROPONINIHS 23* 23* 76* 100* 83*      Chemistry Recent Labs  Lab 09/29/19 2109 09/30/19 0600 10/01/19 0424 10/02/19 0518 10/03/19 0435  NA   < >  --  133* 134* 135  K   < >  --  3.7 3.9 4.2  CL   < >  --  96* 98 97*  CO2   < >  --  _0 GLUCOSE   < >  --  245* 176* 210*  BUN   < >  --  20 24* 23*  CREATININE   < >  --  0.85 0.93 0.93  CALCIUM   < >  --  8.3* 8.2* 8.2*  PROT  --  7.0  --   --   --   ALBUMIN  --  2.7*  --   --   --   AST  --  55*  --   --   --   ALT  --  43  --   --   --   ALKPHOS  --  152*  --   --   --   BILITOT  --  2.3*  --   --   --   GFRNONAA   < >  --  >60 >60 >60  GFRAA   < >  --  >60 >60 >60  ANIONGAP   < >  --  _0 < > = values in this interval not displayed.     Hematology Recent Labs  Lab 09/29/19 2109 10/03/19 0435  WBC 11.4* 9.6  RBC 4.94 4.78  HGB 12.0 11.5*  HCT 38.6 38.3  MCV 78.1* 80.1  MCH 24.3* 24.1*  MCHC 31.1 30.0  RDW 14.1 14.5  PLT 400 326    BNP Recent Labs  Lab 09/29/19 2109  BNP 562.9*     DDimer No results for input(s): DDIMER in the last 168 hours.   Radiology    No results found.  Cardiac  Studies   Right/Left Heart Catheterization 03/29/2019: 1. No obstructive CAD, nonischemic cardiomyopathy.  2. R >L heart failure with low PAPI suggesting significant RV dysfunction.  3. Primarily pulmonary venous hypertension.  4. Low cardiac output, CI 2.07.   Patient will need ongoing diuresis, will start digoxin and Lasix gtt. She will need to start warfarin with heparin bridge.  _______________  Echocardiogram 09/30/2019: Impressions: 1. Severe global reduction in LV systolic function; grade 2 diastolic  dysfunction; moderate LVH; severe RV dysfunction.  2. Left ventricular ejection fraction, by estimation, is <20%. The left  ventricle has severely decreased function. The left ventricle demonstrates  global hypokinesis. There is moderate left ventricular hypertrophy. Left  ventricular diastolic parameters  are consistent with Grade II diastolic dysfunction (pseudonormalization).  Elevated left atrial pressure.  3. Right ventricular systolic function is severely reduced. The right  ventricular size is normal. There is moderately elevated pulmonary artery  systolic pressure.  4. The mitral valve is normal in structure. Trivial mitral valve  regurgitation. No evidence of mitral stenosis.  5. The aortic valve is tricuspid. Aortic valve regurgitation is not  visualized. No aortic stenosis is present.  6. The inferior vena cava is dilated in size with <50% respiratory  variability, suggesting right atrial pressure of 15 mmHg.  Patient Profile     [name] is a 44 y.o. female with a historyof no significant CAD on cardiac catheterization in 07/313, chronic systolic CHF/non-ischemic cardiomyopathy with EF of 20-25% on Echo in 03/2019, paroxysmal atrial fibrillation on Coumadin, poorly controlled hypertension, hyperlipidemia, poorly controlled type 2 diabetes mellitus, and morbid obesitywho is being seen today for the evaluation of acute on chronic CHFat the request of Dr.  Alcario [name].  Assessment & Plan    Acute on Chronic Combined CHF with Biventricular Failure (R > L) - Patient presented with worsening dyspnea on exertion, orthpnea, PND, lower extremity edema, and a 40 lb weight loss.  - BNP elevated at  562.9. - No overt edema on chest x-ray. - Echo this admission showed LVEF of <20% with moderate LVH and grade 2 diastolic dysfunction. Also noted to have severe RV dysfunction with moderately elevated PASP.  - Patient currently on IV Lasix 23m twice daily.  Documented urinary output of 2.6 L in the past 24 hours and net negative 5.5 L since admission. Weight 190 lbs today, down from 200 lbs on 3/9 and 220 lbs on admission. At last office visit with PCP in 07/2019, weighed 186 lbs. Renal function stable. - May be able to switch back to PO meds tomorrow.  - Continue Entresto 49-520mtwice daily, Coreg 12.69m66mwice daily, Spironolactone 269m69mily, and Digoxin 0.1269mg59mly. - Low albumin also likely contributing to volume overload.  - Given continued reduced EF of <20, likely need to have discussions of ICD in the future. - Continue to monitor daily weights, strict I/O's, and renal function.  Demand Ischemia - Patient also presented with worsening chest pain both at rest and with exertion . - High sensitivity-troponin minimally elevated at 76 >> 100 >> 83. - No acute changes on EKG. - LHC in 03/2019 showed no significant CAD. - She continue to report intermittent chest pain. She states it is worse when taking a deep breath but also worse when moving around. However, reassuring that there was no significant disease on recent cath. Also reassuring that CTA negative for PE. - Suspect demand ischemia secondary to acute on chronic CHF. No additional ischemic work-up needed at this time.  Paroxysmal Atrial Fibrillation - Maintaining sinus rhythm. Rate controlled. No addition non-sustained VT noted. - Continue Coreg and Digoxin as above. - Continue chronic  anticoagulation with Coumadin. INR subtherapeutic at 1.5. Patient had apparently not been taking her Coumadin. Dosing per Pharmacy.   Apical Thrombus - Echo in 03/2019 showed apical thrombus. Repeat Echo this admission does not show thrombus. - Continue Coumadin per Pharmacy.   Hypertension - BP markedly elevated on arrival with BP as high as 195/156. Patient had been out of some of her medications for the last week but could not tell me which ones. BP has been mostly well controlled since restarting meds. Most recent BP 108/91 this morning. - Continue Entresto, Coreg, and Spironolactone as above. - Patient does report prolonged dizziness yesterday when sitting upright. Will check orthostatic vital signs.   Hyperlipidemia - Continue home Lipitor 40mg 37my.  Type 2 Diabetes Mellitus - Poorly Controlled - Hemoglobin A1c 11.1. - Management per primary team.  Abdominal Pain - Nausea and vomiting have improved but patient reports continued abdominal pain.  - Hepatic function panel on 3/8 revealed AST of 55, ALT 43, Alk Phos 152, Total Bili 2.3, Direct Bili 0.7, and Indirect Bili 1.6.  - Hepatic congestion may be contributing. - Will defer any additional work-up to primary team.   For questions or updates, please contact [facility] consult [email or web address] for contact info under        Signed, CallieDarreld Mclean  10/03/2019, 7:10 AM

## 2019-10-04 LAB — BASIC METABOLIC PANEL
Anion gap: 8 (ref 5–15)
BUN: 21 mg/dL — ABNORMAL HIGH (ref 6–20)
CO2: 31 mmol/L (ref 22–32)
Calcium: 8.6 mg/dL — ABNORMAL LOW (ref 8.9–10.3)
Chloride: 96 mmol/L — ABNORMAL LOW (ref 98–111)
Creatinine, Ser: 0.96 mg/dL (ref 0.44–1.00)
GFR calc Af Amer: >60 mL/min (ref >60)
GFR calc non Af Amer: >60 mL/min (ref >60)
Glucose, Bld: 165 mg/dL — ABNORMAL HIGH (ref 70–99)
Potassium: 4.2 mmol/L (ref 3.5–5.1)
Sodium: 135 mmol/L (ref 135–145)

## 2019-10-04 LAB — GLUCOSE, CAPILLARY
Glucose-Capillary: 147 mg/dL — ABNORMAL HIGH (ref 70–99)
Glucose-Capillary: 239 mg/dL — ABNORMAL HIGH (ref 70–99)

## 2019-10-04 LAB — PROTIME-INR
INR: 1.6 — ABNORMAL HIGH (ref 0.8–1.2)
Prothrombin Time: 18.8 seconds — ABNORMAL HIGH (ref 11.4–15.2)

## 2019-10-04 MED ORDER — INSULIN GLARGINE 100 UNIT/ML ~~LOC~~ SOLN: 22.0000 [IU] | SUBCUTANEOUS | 11 refills | Status: DC

## 2019-10-04 MED ORDER — TORSEMIDE 20 MG PO TABS: 40.0000 mg | ORAL_TABLET | Freq: Every day | ORAL | 3 refills | Status: DC

## 2019-10-04 MED ORDER — WARFARIN SODIUM 5 MG PO TABS: 10.0000 mg | ORAL_TABLET | Freq: Once | ORAL | Status: DC

## 2019-10-04 MED FILL — LANTUS 100 UNITS/ML VIAL: 100 | 28 days supply | Qty: 10 | Fill #0

## 2019-10-04 MED FILL — TORSEMIDE 20 MG TABLET: 20 | 30 days supply | Qty: 60 | Fill #0

## 2019-10-04 NOTE — Progress Notes (Signed)
Progress Note  Patient Name: [name] Date of Encounter: 10/04/2019  Primary Cardiologist: Loralie Champagne, MD   Subjective   Breathing better discussed importance of med compliance   Inpatient Medications    Scheduled Meds: . atorvastatin  40 mg Oral q1800  . carvedilol  12.5 mg Oral BID WC  . digoxin  0.125 mg Oral Daily  . gabapentin  100 mg Oral Q8H  . insulin aspart  0-15 Units Subcutaneous TID WC  . insulin aspart  0-5 Units Subcutaneous QHS  . insulin aspart  3 Units Subcutaneous TID WC  . insulin glargine  22 Units Subcutaneous Q24H  . potassium chloride  30 mEq Oral Daily  . sacubitril-valsartan  1 tablet Oral BID  . sodium chloride flush  3 mL Intravenous Q12H  . spironolactone  25 mg Oral Daily  . torsemide  40 mg Oral Daily  . Warfarin - Pharmacist Dosing Inpatient   Does not apply q1800   Continuous Infusions: . sodium chloride     PRN Meds: sodium chloride, acetaminophen, albuterol, labetalol, meclizine, methocarbamol, ondansetron (ZOFRAN) IV, sodium chloride flush, traMADol, traZODone   Vital Signs    Vitals:   10/03/19 0626 10/03/19 1508 10/03/19 2023 10/04/19 0612  BP:  106/70 104/66 128/89  Pulse:  76 75 81  Resp:  18 20 20   Temp:  98.1 F (36.7 C) 98 F (36.7 C) 97.6 F (36.4 C)  TempSrc:  Oral    SpO2:  100% 100% 100%  Weight: 86.5 kg     Height:        Intake/Output Summary (Last 24 hours) at 10/04/2019 0904 Last data filed at 10/04/2019 [id number] Gross per 24 hour  Intake 360 ml  Output 1300 ml  Net -940 ml   Last 3 Weights 10/03/2019 10/01/2019 09/30/2019  Weight (lbs) 190 lb 11.2 oz 200 lb 2.8 oz 201 lb 11.5 oz  Weight (kg) 86.501 kg 90.8 kg 91.5 kg      Telemetry    Normal sinus rhythm with rate in the 80's. - Personally Reviewed  ECG    No new ECG tracing today. - Personally Reviewed  Physical Exam   [id number] obese African-American female in no acute distress.   Neck:Supple.  Cardiac:RRR. No significant murmurs,  gallops, or rubs appreciated. Respiratory:No increased work of breath. Lungs clear to auscultation bilaterally.  [id number], obese, and mildly tender to palpation. Bowel sounds present. [id number] pitting edema of bilateral lower extremities. Skin: Warm and dry.  Neuro:No focal deficits.  Psych: Normal affect. Responds appropriately.   Labs    High Sensitivity Troponin:   Recent Labs  Lab 09/24/19 2238 09/25/19 0035 09/30/19 0018 09/30/19 0159 09/30/19 0600  TROPONINIHS 23* 23* 76* 100* 83*      Chemistry Recent Labs  Lab 09/30/19 0600 10/01/19 0424 10/02/19 0518 10/03/19 0435 10/04/19 0442  NA  --    < > 134* 135 135  K  --    < > 3.9 4.2 4.2  CL  --    < > 98 97* 96*  CO2  --    < > 30 29 31   GLUCOSE  --    < > 176* 210* 165*  BUN  --    < > 24* 23* 21*  CREATININE  --    < > 0.93 0.93 0.96  CALCIUM  --    < > 8.2* 8.2* 8.6*  PROT 7.0  --   --   --   --  ALBUMIN 2.7*  --   --   --   --   AST 55*  --   --   --   --   ALT 43  --   --   --   --   ALKPHOS 152*  --   --   --   --   BILITOT 2.3*  --   --   --   --   GFRNONAA  --    < > >60 >60 >60  GFRAA  --    < > >60 >60 >60  ANIONGAP  --    < > 6 9 8    < > = values in this interval not displayed.     Hematology Recent Labs  Lab 09/29/19 2109 10/03/19 0435  WBC 11.4* 9.6  RBC 4.94 4.78  HGB 12.0 11.5*  HCT 38.6 38.3  MCV 78.1* 80.1  MCH 24.3* 24.1*  MCHC 31.1 30.0  RDW 14.1 14.5  PLT 400 326    BNP Recent Labs  Lab 09/29/19 2109  BNP 562.9*     DDimer No results for input(s): DDIMER in the last 168 hours.   Radiology    No results found.  Cardiac Studies   Right/Left Heart Catheterization 03/29/2019: 1. No obstructive CAD, nonischemic cardiomyopathy.  2. R >L heart failure with low PAPI suggesting significant RV dysfunction.  3. Primarily pulmonary venous hypertension.  4. Low cardiac output, CI 2.07.   Patient will need ongoing diuresis, will start digoxin and Lasix gtt. She will  need to start warfarin with heparin bridge.  _______________  Echocardiogram 09/30/2019: Impressions: 1. Severe global reduction in LV systolic function; grade 2 diastolic  dysfunction; moderate LVH; severe RV dysfunction.  2. Left ventricular ejection fraction, by estimation, is <20%. The left  ventricle has severely decreased function. The left ventricle demonstrates  global hypokinesis. There is moderate left ventricular hypertrophy. Left  ventricular diastolic parameters  are consistent with Grade II diastolic dysfunction (pseudonormalization).  Elevated left atrial pressure.  3. Right ventricular systolic function is severely reduced. The right  ventricular size is normal. There is moderately elevated pulmonary artery  systolic pressure.  4. The mitral valve is normal in structure. Trivial mitral valve  regurgitation. No evidence of mitral stenosis.  5. The aortic valve is tricuspid. Aortic valve regurgitation is not  visualized. No aortic stenosis is present.  6. The inferior vena cava is dilated in size with <50% respiratory  variability, suggesting right atrial pressure of 15 mmHg.  Patient Profile     [name] is a 44 y.o. female with a historyof no significant CAD on cardiac catheterization in [id number], chronic systolic CHF/non-ischemic cardiomyopathy with EF of 20-25% on Echo in 03/2019, paroxysmal atrial fibrillation on Coumadin, poorly controlled hypertension, hyperlipidemia, poorly controlled type 2 diabetes mellitus, and morbid obesitywho is being seen today for the evaluation of acute on chronic CHFat the request of Dr. Alcario Drought.  Assessment & Plan    Acute on Chronic Combined CHF with Biventricular Failure (R > L)  BNP 562 no edema on CXR more right sided Echo with EF <20% severe RV dysfunction grade 2 diastolic dysfunction . Has diuresed well now on oral aldactone and torsemide Continue coreg , entresto and digoxin will need to discuss AICD with Dr Aundra Dubin on CHF  clinic f/u   Demand Ischemia  Mild elevation in troponin this admission max 100 She had no CAD on cath September 2020 observe  CTA on admission negative for PE  Paroxysmal Atrial Fibrillation presented subRx due to compliance issues Now in NSR INR 1.6 dose adjusted by pharmacy ok to d/c with sub Rx INR since she is in sinus    Apical Thrombus resolved by echo this admission chronic anticoagulation due to PAF   Hypertension elevated on admission due to non compliance with meds better now   Hyperlipidemia  Continue home Lipitor 40mg  daily.  Type 2 Diabetes Mellitus - Poorly Controlled A1c 11.1 per primary team    [place] for d/c home will arrange outpatient f/u in CHF clinic and coumadin clinic  Cardiology to sign off   For questions or updates, please contact [place] Please consult [email or web address] for contact info under        Signed, Jenkins Rouge, MD  10/04/2019, 9:04 AM

## 2019-10-04 NOTE — Discharge Summary (Signed)
Physician Discharge Summary  [name] LVD:[id number] DOB: 27-Oct-1975 DOA: 09/29/2019  PCP: [name], DO  Admit date: 09/29/2019 Discharge date: 10/04/2019  Admitted From: Home  Discharge disposition: Home   Recommendations for Outpatient Follow-Up:   . Follow up with your primary care provider in one week.  . Check CBC, BMP in the next visit . Follow-up with congestive heart failure clinic as scheduled by cardiology.  Discharge Diagnosis:   Principal Problem:   Hypertensive urgency Active Problems:   DM2 (diabetes mellitus, type 2) (HCC)   HFrEF (heart failure with reduced ejection fraction) (HCC)   Paroxysmal atrial fibrillation (HCC)   Acute on chronic systolic CHF (congestive heart failure) (HCC)   Elevated troponin   Hypertensive emergency   Acute on chronic congestive heart failure ([place])   Discharge Condition: Improved.  Diet recommendation: Low sodium, heart healthy.  Carbohydrate-modified.  Fluid restriction 1500 mL/day.  Wound care: None.  Code status: Full.   History of Present Illness:    Tammy Muzyka Jonesis a 44 y.o.femalewith medical history significant ofHFrEF with 20-25% EF, PAF on coumadin, DM2, HTN presented to the hospital with complaints of chest pain and shortness of breath for last 1 month.  Patient was not taking her Lasix.  Has had recurrent visits to the emergency department.  CT angiogram showed right-sided heart failure BNP 562.9, Trops of 76 and 100.  Patient did have dyspnea on exertion.  In the ED, patient was noted to have elevated blood pressure and was admitted to the hospital for hypertensive emergency.  Cardiology was consulted.  Hospital Course:   Following conditions were addressed during hospitalization as listed below,  Hhypertensive emergency with chest pain and elevated troponin. Improved.  This was  thought to be secondary to noncompliance.  Elevated troponin thought to be secondary to demand ischemia.  History of  recurrent ED visits.   improved with resumption of home medications including diuretics..  No further work-up planned as per cardiology.  Patient will have to follow-up with cardiology clinic after discharge.  Acute on chronic combined systolic and diastolic CHF. Improved and compensated at this time.  2D echo on 09/30/2019 showed LVEF less than 20% with grade 2 diastolic dysfunction, severe RV dysfunction andLVglobal hypokinesis.  Cardiology followed the patient during hospitalization and was initially on IV Lasix 80 mg iv twice a day.  Changed to torsemide on discharge.  Patient will continue Entresto spironolactone, Coreg and digoxin. Baseline-dry weight of 187 pounds.  Patient was almost at baseline with prior to discharge.  Type 2 diabetes with hyperglycemia Hemoglobin A1c 11.1.  She has been prescribed Lantus on discharge.  On Jardiance at home.  Was emphasized on diabetic diet and closer monitoring of her blood glucose levels.  Hypokalemia Improved.  Latest potassium of 4.2.  Will need outpatient follow-up labs.  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  Continue Coumadin.  INR subtherapeutic at 1.6  Patient was advised the importance of compliance with Coumadin and the need for follow-up in the Coumadin clinic.  Medication noncompliance Educated on importance of compliance  Mmorbid obesity BMI 40.  Disposition.  At this time, patient is stable for disposition home.  She was emphasized the importance of follow-up as outpatient and being compliant with her medication.  Medical Consultants:    Cardiology  Procedures:   None  Subjective:   Today, patient does not have shortness of  breath, chest pain, palpitation. Leg swelling has improved.  Discharge Exam:  Vitals:   10/03/19 2023 10/04/19 0612  BP: 104/66 128/89  Pulse: 75 81  Resp: 20 20  Temp: 98 F (36.7 C) 97.6 F (36.4 C)  SpO2: 100% 100%   Vitals:   10/03/19 0626  10/03/19 1508 10/03/19 2023 10/04/19 0612  BP:  106/70 104/66 128/89  Pulse:  76 75 81  Resp:  '18 20 20  ' Temp:  98.1 F (36.7 C) 98 F (36.7 C) 97.6 F (36.4 C)  TempSrc:  Oral    SpO2:  100% 100% 100%  Weight: 86.5 kg     Height:        General: Alert awake, not in obvious distress, morbidly obese HENT: pupils equally reacting to light,  No scleral pallor or icterus noted. Oral mucosa is moist.  Chest:  Clear breath sounds.  Diminished breath sounds bilaterally. No crackles or wheezes.  CVS: S1 &S2 heard. No murmur.  Regular rate and rhythm. Abdomen: Soft, nontender, nondistended.  Bowel sounds are heard.   Extremities: No cyanosis, clubbing but trace edema, peripheral pulses are palpable. Psych: Alert, awake and oriented, normal mood CNS:  No cranial nerve deficits.  Power equal in all extremities.   Skin: Warm and dry.  No rashes noted.  The results of significant diagnostics from this hospitalization (including imaging, microbiology, ancillary and laboratory) are listed below for reference.     Diagnostic Studies:   DG Chest 2 View  Result Date: 09/29/2019 CLINICAL DATA:  Shortness of breath and chest pain for 1 month EXAM: CHEST - 2 VIEW COMPARISON:  Radiograph 09/24/2019 FINDINGS: Cardiomegaly is similar to comparison exams. Stable area of scarring in the left mid lung. There is mild central pulmonary vascular congestion without overt edema. No pneumothorax or effusion. No acute osseous or soft tissue abnormality. Telemetry leads overlie the chest. IMPRESSION: Stable cardiomegaly and mild central pulmonary vascular congestion without overt edema. Electronically Signed   By: Lovena Le M.D.   On: 09/29/2019 20:58   CT Angio Chest PE W and/or Wo Contrast  Result Date: 09/30/2019 CLINICAL DATA:  44 year old female with chest pain and shortness of breath for 1 month. EXAM: CT ANGIOGRAPHY CHEST WITH CONTRAST TECHNIQUE: Multidetector CT imaging of the chest was performed using the  standard protocol during bolus administration of intravenous contrast. Multiplanar CT image reconstructions and MIPs were obtained to evaluate the vascular anatomy. CONTRAST:  140m OMNIPAQUE IOHEXOL 350 MG/ML SOLN COMPARISON:  Chest radiographs 09/29/2019 and earlier. FINDINGS: Cardiovascular: Excellent contrast bolus timing in the pulmonary arterial tree. Mild lower lobe respiratory motion. No focal filling defect identified in the pulmonary arteries to suggest acute pulmonary embolism. Mild cardiomegaly (series 4, image 72). No pericardial effusion. There is some contrast reflux into the IVC and hepatic veins. No contrast in the aorta. No calcified coronary artery atherosclerosis is evident. Mediastinum/Nodes: Negative. Lungs/Pleura: Major airways are patent. There is a small layering right pleural effusion. Mild associated lower lobe opacity most resembles atelectasis. There is also platelike atelectasis in the left lower lobe and lingula. Upper Abdomen: Negative visible liver, spleen, pancreas, adrenal glands, kidneys and bowel. Musculoskeletal: No acute osseous abnormality identified. Review of the MIP images confirms the above findings. IMPRESSION: 1. Negative for acute pulmonary embolus. 2. Cardiomegaly with some contrast reflux into the hepatic vein and IVC raising the possibility of right heart insufficiency. 3. Superimposed small layering right pleural effusion and mild pulmonary atelectasis. \ Electronically Signed   By: HGenevie AnnM.D.   On: 09/30/2019 02:12   ECHOCARDIOGRAM COMPLETE  Result Date: 09/30/2019    ECHOCARDIOGRAM REPORT   Patient Name:   [name] Date of Exam: 09/30/2019 Medical Rec #:  [id number]       Height:       59.0 in Accession #:    [id number]      Weight:       201.7 lb Date of Birth:  May 05, 1976        BSA:          1.851 m Patient Age:    22 years        BP:           116/81 mmHg Patient Gender: F               HR:           80 bpm. Exam Location:  Inpatient Procedure: 2D Echo  Indications:    428.21 CHF  History:        Patient has prior history of Echocardiogram examinations, most                 recent 03/28/2019. CHF, Arrythmias:Atrial Fibrillation; Risk                 Factors:Hypertension and Diabetes.  Sonographer:    Jannett Celestine RDCS (AE) Referring Phys: [place]  1. Severe global reduction in LV systolic function; grade 2 diastolic dysfunction; moderate LVH; severe RV dysfunction.  2. Left ventricular ejection fraction, by estimation, is <20%. The left ventricle has severely decreased function. The left ventricle demonstrates global hypokinesis. There is moderate left ventricular hypertrophy. Left ventricular diastolic parameters are consistent with Grade II diastolic dysfunction (pseudonormalization). Elevated left atrial pressure.  3. Right ventricular systolic function is severely reduced. The right ventricular size is normal. There is moderately elevated pulmonary artery systolic pressure.  4. The mitral valve is normal in structure. Trivial mitral valve regurgitation. No evidence of mitral stenosis.  5. The aortic valve is tricuspid. Aortic valve regurgitation is not visualized. No aortic stenosis is present.  6. The inferior vena cava is dilated in size with <50% respiratory variability, suggesting right atrial pressure of 15 mmHg. FINDINGS  Left Ventricle: Left ventricular ejection fraction, by estimation, is <20%. The left ventricle has severely decreased function. The left ventricle demonstrates global hypokinesis. The left ventricular internal cavity size was normal in size. There is moderate left ventricular hypertrophy. Left ventricular diastolic parameters are consistent with Grade II diastolic dysfunction (pseudonormalization). Elevated left atrial pressure. Right Ventricle: The right ventricular size is normal. Right ventricular systolic function is severely reduced. There is moderately elevated pulmonary artery systolic pressure. The  tricuspid regurgitant velocity is 2.77 m/s, and with an assumed right atrial pressure of 15 mmHg, the estimated right ventricular systolic pressure is 62.8 mmHg. Left Atrium: Left atrial size was normal in size. Right Atrium: Right atrial size was normal in size. Pericardium: Trivial pericardial effusion is present. Mitral Valve: The mitral valve is normal in structure. Normal mobility of the mitral valve leaflets. Trivial mitral valve regurgitation. No evidence of mitral valve stenosis. Tricuspid Valve: The tricuspid valve is normal in structure. Tricuspid valve regurgitation is mild . No evidence of tricuspid stenosis. Aortic Valve: The aortic valve is tricuspid. Aortic valve regurgitation is not visualized. No aortic stenosis is present. Pulmonic Valve: The pulmonic valve was normal in structure. Pulmonic valve regurgitation is trivial. No evidence of pulmonic stenosis. Aorta: The aortic root is normal in size and structure. Venous: The inferior  vena cava is dilated in size with less than 50% respiratory variability, suggesting right atrial pressure of 15 mmHg.  Additional Comments: Severe global reduction in LV systolic function; grade 2 diastolic dysfunction; moderate LVH; severe RV dysfunction.  LEFT VENTRICLE PLAX 2D LVIDd:         4.70 cm  Diastology LVIDs:         4.50 cm  LV e' lateral:   4.03 cm/s LV PW:         1.40 cm  LV E/e' lateral: 18.5 LV IVS:        1.20 cm  LV e' medial:    4.79 cm/s LVOT diam:     1.90 cm  LV E/e' medial:  15.6 LV SV:         27 LV SV Index:   15 LVOT Area:     2.84 cm  LEFT ATRIUM           Index LA diam:      3.80 cm 2.05 cm/m LA Vol (A4C): 53.7 ml 29.01 ml/m  AORTIC VALVE LVOT Vmax:   69.80 cm/s LVOT Vmean:  46.000 cm/s LVOT VTI:    0.097 m  AORTA Ao Root diam: 3.10 cm MITRAL VALVE               TRICUSPID VALVE MV Area (PHT): 5.31 cm    TR Peak grad:   30.7 mmHg MV Decel Time: 143 msec    TR Vmax:        277.00 cm/s MV E velocity: 74.60 cm/s MV A velocity: 43.70 cm/s   SHUNTS MV E/A ratio:  1.71        Systemic VTI:  0.10 m                            Systemic Diam: 1.90 cm Kirk Ruths MD Electronically signed by Kirk Ruths MD Signature Date/Time: 09/30/2019/3:38:24 PM    Final      Labs:   Basic Metabolic Panel: Recent Labs  Lab 09/29/19 2109 09/29/19 2109 10/01/19 0424 10/01/19 0424 10/02/19 0518 10/02/19 0518 10/03/19 0435 10/04/19 0442  NA 130*  --  133*  --  134*  --  135 135  K 4.7   < > 3.7   < > 3.9   < > 4.2 4.2  CL 95*  --  96*  --  98  --  97* 96*  CO2 24  --  29  --  30  --  29 31  GLUCOSE 395*  --  245*  --  176*  --  210* 165*  BUN 19  --  20  --  24*  --  23* 21*  CREATININE 0.93  --  0.85  --  0.93  --  0.93 0.96  CALCIUM 9.0  --  8.3*  --  8.2*  --  8.2* 8.6*  MG  --   --   --   --  2.1  --  1.9  --    < > = values in this interval not displayed.   GFR Estimated Creatinine Clearance: 73.7 mL/min (by C-G formula based on SCr of 0.96 mg/dL). Liver Function Tests: Recent Labs  Lab 09/30/19 0600  AST 55*  ALT 43  ALKPHOS 152*  BILITOT 2.3*  PROT 7.0  ALBUMIN 2.7*   No results for input(s): LIPASE, AMYLASE in the last 168 hours. No results for input(s): AMMONIA in the  last 168 hours. Coagulation profile Recent Labs  Lab 09/30/19 0600 10/01/19 0424 10/02/19 0518 10/03/19 0435 10/04/19 0442  INR 1.3* 1.3* 1.2 1.5* 1.6*    CBC: Recent Labs  Lab 09/29/19 2109 10/03/19 0435  WBC 11.4* 9.6  NEUTROABS 7.7  --   HGB 12.0 11.5*  HCT 38.6 38.3  MCV 78.1* 80.1  PLT 400 326   Cardiac Enzymes: No results for input(s): CKTOTAL, CKMB, CKMBINDEX, TROPONINI in the last 168 hours. BNP: Invalid input(s): POCBNP CBG: Recent Labs  Lab 10/03/19 0753 10/03/19 1158 10/03/19 1750 10/03/19 2026 10/04/19 0730  GLUCAP 206* 204* 130* 180* 147*   D-Dimer No results for input(s): DDIMER in the last 72 hours. Hgb A1c No results for input(s): HGBA1C in the last 72 hours. Lipid Profile No results for input(s):  CHOL, HDL, LDLCALC, TRIG, CHOLHDL, LDLDIRECT in the last 72 hours. Thyroid function studies No results for input(s): TSH, T4TOTAL, T3FREE, THYROIDAB in the last 72 hours.  Invalid input(s): FREET3 Anemia work up No results for input(s): VITAMINB12, FOLATE, FERRITIN, TIBC, IRON, RETICCTPCT in the last 72 hours. Microbiology Recent Results (from the past 240 hour(s))  SARS CORONAVIRUS 2 (TAT 6-24 HRS) Nasopharyngeal Nasopharyngeal Swab     Status: None   Collection Time: 09/30/19  6:04 AM   Specimen: Nasopharyngeal Swab  Result Value Ref Range Status   SARS Coronavirus 2 NEGATIVE NEGATIVE Final    Comment: (NOTE) SARS-CoV-2 target nucleic acids are NOT DETECTED. The SARS-CoV-2 RNA is generally detectable in upper and lower respiratory specimens during the acute phase of infection. Negative results do not preclude SARS-CoV-2 infection, do not rule out co-infections with other pathogens, and should not be used as the sole basis for treatment or other patient management decisions. Negative results must be combined with clinical observations, patient history, and epidemiological information. The expected result is Negative. Fact Sheet for Patients: SugarRoll.be Fact Sheet for Healthcare Providers: https://www.woods-mathews.com/ This test is not yet approved or cleared by the [place] FDA and  has been authorized for detection and/or diagnosis of SARS-CoV-2 by FDA under an Emergency Use Authorization (EUA). This EUA will remain  in effect (meaning this test can be used) for the duration of the COVID-19 declaration under Section 56 4(b)(1) of the Act, 21 U.S.C. section 360bbb-3(b)(1), unless the authorization is terminated or revoked sooner. Performed at [facility], [place] [address], [place], [place] [address]      Discharge Instructions:   Discharge Instructions    Diet - low sodium heart healthy   Complete by: As directed     Diabetic diet, fluid restriction 15ooml/day   Discharge instructions   Complete by: As directed    Please follow-up with your primary care physician in 1 to 2 weeks.  Please take medications as prescribed without interruption.  You have been prescribed insulin which you need to take every day.  Please follow-up with your cardiologist in the CHF clinic as scheduled by the clinic.  Continue to take  Coumadin and follow-up at the Coumadin clinic.   Increase activity slowly   Complete by: As directed      Allergies as of 10/04/2019      Reactions   Shellfish Allergy Swelling   Airway involvement including EMS - Has Epi-Pen   Dulaglutide Nausea And Vomiting, Other (See Comments)   Multiple episodes of vomiting over multiple days   Metformin And Related Diarrhea      Medication List    STOP taking these medications  Miconazole 7 100 MG vaginal suppository Generic drug: miconazole     TAKE these medications   Accu-Chek Aviva Plus w/Device Kit 1 application by Does not apply route QID. Check fasting sugars as well as throughout the day   Accu-Chek Softclix Lancet Dev Kit 1 application by Does not apply route 2 (two) times daily.   Accu-Chek Softclix Lancets lancets Use as instructed   Accu-Chek Softclix Lancets lancets Check cbg 2 times daily   albuterol 108 (90 Base) MCG/ACT inhaler Commonly known as: VENTOLIN HFA Inhale 2 puffs into the lungs every 6 (six) hours as needed for wheezing or shortness of breath.   atorvastatin 40 MG tablet Commonly known as: LIPITOR Take 1 tablet (40 mg total) by mouth daily at 6 PM.   carvedilol 6.25 MG tablet Commonly known as: COREG Take 2 tablets (12.5 mg total) by mouth 2 (two) times daily with a meal.   digoxin 0.125 MG tablet Commonly known as: LANOXIN Take 1 tablet (0.125 mg total) by mouth daily.   empagliflozin 25 MG Tabs tablet Commonly known as: JARDIANCE Take 25 mg by mouth daily.   Entresto 49-51 MG Generic drug:  sacubitril-valsartan Take 1 tablet by mouth 2 (two) times daily.   gabapentin 100 MG capsule Commonly known as: NEURONTIN Take 1 capsule (100 mg total) by mouth every 8 (eight) hours.   glucose blood test strip Commonly known as: AgaMatrix Presto Test Use as instructed   Accu-Chek Aviva Plus test strip Generic drug: glucose blood Check CBG 2 times per day   insulin glargine 100 UNIT/ML injection Commonly known as: LANTUS Inject 0.22 mLs (22 Units total) into the skin daily.   meclizine 12.5 MG tablet Commonly known as: ANTIVERT Take 1 tablet (12.5 mg total) by mouth 3 (three) times daily as needed for dizziness.   methocarbamol 500 MG tablet Commonly known as: Robaxin Take 1 tablet (500 mg total) by mouth every 8 (eight) hours as needed for muscle spasms.   potassium chloride SA 20 MEQ tablet Commonly known as: KLOR-CON Take 1 tablet every Monday, Wednesday, and Friday.   spironolactone 25 MG tablet Commonly known as: ALDACTONE Take 1 tablet (25 mg total) by mouth daily.   terconazole 0.8 % vaginal cream Commonly known as: TERAZOL 3 Place 1 applicator vaginally at bedtime.   torsemide 20 MG tablet Commonly known as: DEMADEX Take 2 tablets (40 mg total) by mouth daily. What changed:   how much to take  how to take this  when to take this  additional instructions   traZODone 50 MG tablet Commonly known as: DESYREL Take 1 tablet (50 mg total) by mouth at bedtime as needed. for sleep   warfarin 7.5 MG tablet Commonly known as: COUMADIN Take as directed. If you are unsure how to take this medication, talk to your nurse or doctor. Original instructions: Take 1 tablet (7.5 mg total) by mouth daily at 6 PM.      Follow-up Information    Schedule an appointment as soon as possible for a visit  with Your primary care provider.        [facility] DEPT.   Specialty: Emergency Medicine Why: If symptoms worsen Contact  information: [place] [phone] [place] [phone] [place] Follow up.   Specialty: Cardiology Why: Hospital follow-up scheduled for 10/17/2019 at 10:00am. Please arrive 15 minutes early for check-in. If this date/time does  not work for you, please call our offie to reschedule.  Contact information: [address] [id number] [place] [phone]          Time coordinating discharge: 39 minutes  Signed:  Divonte Senger  [facility] 10/04/2019, 10:41 AM

## 2019-10-04 NOTE — Progress Notes (Signed)
[facility] for Warfarin Indication: atrial fibrillation  Allergies  Allergen Reactions  . Shellfish Allergy Swelling    Airway involvement including EMS - Has Epi-Pen  . Dulaglutide Nausea And Vomiting and Other (See Comments)    Multiple episodes of vomiting over multiple days  . Metformin And Related Diarrhea    Patient Measurements: Height: 4\' 11"  (149.9 cm) Weight: 190 lb 11.2 oz (86.5 kg) IBW/kg (Calculated) : 43.2  Vital Signs: Temp: 97.6 F (36.4 C) (03/12 0612) BP: 128/89 (03/12 0612) Pulse Rate: 81 (03/12 0612)  Labs: Recent Labs    10/02/19 0518 10/03/19 0435 10/04/19 0442  HGB  --  11.5*  --   HCT  --  38.3  --   PLT  --  326  --   LABPROT 15.4* 18.2* 18.8*  INR 1.2 1.5* 1.6*  CREATININE 0.93 0.93 0.96    Estimated Creatinine Clearance: 73.7 mL/min (by C-G formula based on SCr of 0.96 mg/dL).   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   Home dose warfarin: 7.5 mg daily.  Last dose: unk date/time INR = 1.3 on admission due to recent non-compliance.  Today, 10/02/2019:  CBC: stable, WNL  INR (1.6) remains subtherapeutic despite several boosted doses; however, it is expected to take several days to increase after missing several days of warfarin PTA. INR slowly increasing.  Major drug interactions: none noted  No bleeding documented  Meal intake not charted  Of note, pt admitted with HF exacerbation which can make pt more sensitive to effects of warfarin  Goal of Therapy: INR 2-3  Plan:   Warfarin 10 mg x1 this evening  Daily INR  Monitor for signs of bleeding or worsening thrombosis  CBC q72h while inpatient  Recommend resuming home dose of warfarin at discharge with clinic follow up in 2-3 days since INR was low due to non-compliance.  Lenis Noon, PharmD 10/04/2019,9:14 AM

## 2019-10-04 NOTE — Progress Notes (Signed)
Pt discharged home today per Dr. Louanne Belton. Pt's IV site D/C'd and WDL. Pt's VSS. Pt provided with home medication list, discharge instructions and instructions on where to obtain new prescriptions/medication refills. Verbalized understanding. Pt left floor via WC in stable condition accompanied by NT.

## 2019-10-04 NOTE — TOC Transition Note (Signed)
Transition of Care [facility]) - CM/SW Discharge Note   Patient Details  Name: IRAIMA NEIBAUER MRN: [id number] Date of Birth: 1975/11/02  [facility] CM/SW Contact:  Lennart Pall, LCSW Phone Number: 10/04/2019, 3:04 PM   Clinical Narrative:  Pt cleared for d/c today.  She is aware of medications to be picked up at [facility] before 5 pm today.  [facility] alerted of d/c and will be providing HHRN.       Final next level of care: [place] Barriers to Discharge: Barriers Resolved   Patient Goals and CMS Choice Patient states their goals for this hospitalization and ongoing recovery are:: return home CMS Medicare.gov Compare Post Acute Care list provided to:: Patient Choice offered to / list presented to : Patient  Discharge Placement                       Discharge Plan and Services                DME Arranged: N/A         HH Arranged: RN [address] [place] Agency: [facility] Date [place]: 10/04/19 Time [place] Agency Contacted: 1110(contacted [place] with [facility] to confirm d/c today) Representative spoke with at [place]: [place] Determinants of Health ([place]) Interventions     Readmission Risk Interventions Readmission Risk Prevention Plan 10/01/2019 04/02/2019  Transportation Screening Complete Complete  PCP or Specialist Appt within 3-5 Days Complete Complete  HRI or [place] Complete Complete  Social Work Consult for [place] Planning/Counseling Complete Complete  Palliative Care Screening Not Applicable Not Applicable  Medication Review Press photographer) - Complete  Some recent data might be hidden

## 2019-10-07 ENCOUNTER — Other Ambulatory Visit: Payer: Self-pay

## 2019-10-07 ENCOUNTER — Ambulatory Visit (INDEPENDENT_AMBULATORY_CARE_PROVIDER_SITE_OTHER): Payer: Self-pay | Admitting: *Deleted

## 2019-10-07 DIAGNOSIS — Z5181 Encounter for therapeutic drug level monitoring: Secondary | ICD-10-CM

## 2019-10-07 DIAGNOSIS — I480 Paroxysmal atrial fibrillation: Secondary | ICD-10-CM

## 2019-10-07 LAB — POCT INR: INR: 1.7 — AB (ref 2.0–3.0)

## 2019-10-07 NOTE — Patient Instructions (Addendum)
  Description   Start taking 1 tablet daily excepet for 1.5 tablets on Mondays and Fridays. Recheck INR in 1 week. Call coumadin clinic for any questions [phone].     A full discussion of the nature of anticoagulants has been carried out.  A benefit risk analysis has been presented to the patient, so that they understand the justification for choosing anticoagulation at this time. The need for frequent and regular monitoring, precise dosage adjustment and compliance is stressed.  Side effects of potential bleeding are discussed.  The patient should avoid any OTC items containing aspirin or ibuprofen, and should avoid great swings in general diet.  Avoid alcohol consumption.  Call if any signs of abnormal bleeding.

## 2019-10-09 ENCOUNTER — Ambulatory Visit (INDEPENDENT_AMBULATORY_CARE_PROVIDER_SITE_OTHER): Payer: Self-pay | Admitting: Family Medicine

## 2019-10-09 ENCOUNTER — Encounter: Payer: Self-pay | Admitting: Family Medicine

## 2019-10-09 ENCOUNTER — Other Ambulatory Visit: Payer: Self-pay

## 2019-10-09 VITALS — BP 110/80 | HR 82 | Temp 98.1°F | Ht 59.0 in | Wt 175.0 lb

## 2019-10-09 DIAGNOSIS — E871 Hypo-osmolality and hyponatremia: Secondary | ICD-10-CM

## 2019-10-09 DIAGNOSIS — E1165 Type 2 diabetes mellitus with hyperglycemia: Secondary | ICD-10-CM

## 2019-10-09 NOTE — Patient Instructions (Signed)
It was a pleasure to see you today! Thank you for choosing [facility] for your primary care. [name] was seen for diabetes control and work note. Come back to the clinic if there is anything we can do for you.  Today we talked about your diabetes control, given so the medication restrictions you have the best control will be diet controlled.  Please avoid all sweetened teas and lemonade's.  Please avoid pastas and cereals.  It would be best if you generally considered all breads, pastas, rice is, cereals, chips, potatoes to essentially be sugar.   Please bring all your medications to every doctors visit   Sign up for My Chart to have easy access to your labs results, and communication with your Primary care physician.     Please check-out at the front desk before leaving the clinic.     Best,  Dr. [name] FAMILY MEDICINE RESIDENT - PGY3 10/09/2019 3:54 PM

## 2019-10-10 LAB — BASIC METABOLIC PANEL
BUN/Creatinine Ratio: 17 (ref 9–23)
BUN: 18 mg/dL (ref 6–24)
CO2: 20 mmol/L (ref 20–29)
Calcium: 9.5 mg/dL (ref 8.7–10.2)
Chloride: 98 mmol/L (ref 96–106)
Creatinine, Ser: 1.08 mg/dL — ABNORMAL HIGH (ref 0.57–1.00)
GFR calc Af Amer: 74 mL/min/{1.73_m2} (ref >59)
GFR calc non Af Amer: 64 mL/min/{1.73_m2} (ref >59)
Glucose: 312 mg/dL — ABNORMAL HIGH (ref 65–99)
Potassium: 4.6 mmol/L (ref 3.5–5.2)
Sodium: 132 mmol/L — ABNORMAL LOW (ref 134–144)

## 2019-10-13 DIAGNOSIS — E871 Hypo-osmolality and hyponatremia: Secondary | ICD-10-CM | POA: Insufficient documentation

## 2019-10-13 DIAGNOSIS — E876 Hypokalemia: Secondary | ICD-10-CM | POA: Insufficient documentation

## 2019-10-13 NOTE — Assessment & Plan Note (Signed)
Patient has been having issues with her employer, needs to get her A1c below 10.  Down from 68 in November but still at 11.1.  Is finally reporting regular CBGs under 200 which would get her under goal.  Continue to work on stopping to drink sugars which is still happening on a regular basis, patient refuses nutrition referral.  I have written a letter describing her progress to her employer.

## 2019-10-13 NOTE — Progress Notes (Signed)
    SUBJECTIVE:   CHIEF COMPLAINT / HPI:   DM2 (diabetes mellitus, type 2) ([place]) Patient has been having issues with her employer, needs to get her A1c below 10.  Down from 34 in November but still at 11.1.  Is finally reporting regular CBGs under 200 which would get her under goal.  PERTINENT  PMH / PSH: HFrEF, DM2  OBJECTIVE:   BP 110/80   Pulse 82   Temp 98.1 F (36.7 C) (Oral)   Ht 4\' 11"  (1.499 m)   Wt 175 lb (79.4 kg)   LMP 09/19/2019   SpO2 100%   BMI 35.35 kg/m   General: Alert pleasant, no distress Respiratory: No increased work of breathing, no cough Cardiac: Regular rate  ASSESSMENT/PLAN:   DM2 (diabetes mellitus, type 2) ([place]) Patient has been having issues with her employer, needs to get her A1c below 10.  Down from 49 in November but still at 11.1.  Is finally reporting regular CBGs under 200 which would get her under goal.  Continue to work on stopping to drink sugars which is still happening on a regular basis, patient refuses nutrition referral.  I have written a letter describing her progress to her employer.  Hyponatremia Sodium at 132 which is near patient's baseline between 130 and 135.  We will continue to follow     [name], [place]

## 2019-10-13 NOTE — Assessment & Plan Note (Signed)
Sodium at 132 which is near patient's baseline between 130 and 135.  We will continue to follow

## 2019-10-14 ENCOUNTER — Ambulatory Visit (INDEPENDENT_AMBULATORY_CARE_PROVIDER_SITE_OTHER): Payer: Self-pay | Admitting: *Deleted

## 2019-10-14 ENCOUNTER — Other Ambulatory Visit: Payer: Self-pay

## 2019-10-14 ENCOUNTER — Encounter (INDEPENDENT_AMBULATORY_CARE_PROVIDER_SITE_OTHER): Payer: Self-pay

## 2019-10-14 ENCOUNTER — Telehealth: Payer: Self-pay | Admitting: *Deleted

## 2019-10-14 DIAGNOSIS — I480 Paroxysmal atrial fibrillation: Secondary | ICD-10-CM

## 2019-10-14 DIAGNOSIS — Z5181 Encounter for therapeutic drug level monitoring: Secondary | ICD-10-CM

## 2019-10-14 LAB — POCT INR: INR: 2 (ref 2.0–3.0)

## 2019-10-14 NOTE — Telephone Encounter (Signed)
LVM for pt to call office back to inform her of below and assist her in getting this follow up visit in one month per Dr. Criss Rosales. Neha Waight Zimmerman Rumple, CMA

## 2019-10-14 NOTE — Patient Instructions (Signed)
Description   Start taking 1 tablet daily excepet for 1.5 tablets on Mondays, Wednesdays, and Fridays. Recheck INR in 1 week. Call coumadin clinic for any questions [phone].

## 2019-10-14 NOTE — Telephone Encounter (Signed)
-----   Message from [place], DO sent at 10/13/2019  6:30 PM EDT ----- Cbg still elevated although she reports normal fasting now <200, sodium low but within prior baseline 103-135.  Please call for f/u visit in ~1 month

## 2019-10-17 ENCOUNTER — Other Ambulatory Visit: Payer: Self-pay

## 2019-10-17 ENCOUNTER — Encounter (HOSPITAL_COMMUNITY): Payer: Self-pay

## 2019-10-17 ENCOUNTER — Ambulatory Visit (HOSPITAL_COMMUNITY)
Admit: 2019-10-17 | Discharge: 2019-10-17 | Disposition: A | Discharge status: Home / Self Care | Payer: Medicaid Other | Source: Ambulatory Visit | Attending: Adult Health | Admitting: Adult Health

## 2019-10-17 ENCOUNTER — Telehealth (HOSPITAL_COMMUNITY): Payer: Self-pay | Admitting: Licensed Clinical Social Worker

## 2019-10-17 VITALS — BP 154/108 | HR 94 | Wt 188.4 lb

## 2019-10-17 DIAGNOSIS — J45909 Unspecified asthma, uncomplicated: Secondary | ICD-10-CM | POA: Insufficient documentation

## 2019-10-17 DIAGNOSIS — Z833 Family history of diabetes mellitus: Secondary | ICD-10-CM | POA: Diagnosis not present

## 2019-10-17 DIAGNOSIS — E1165 Type 2 diabetes mellitus with hyperglycemia: Secondary | ICD-10-CM | POA: Insufficient documentation

## 2019-10-17 DIAGNOSIS — E785 Hyperlipidemia, unspecified: Secondary | ICD-10-CM | POA: Diagnosis not present

## 2019-10-17 DIAGNOSIS — Z794 Long term (current) use of insulin: Secondary | ICD-10-CM | POA: Insufficient documentation

## 2019-10-17 DIAGNOSIS — I2510 Atherosclerotic heart disease of native coronary artery without angina pectoris: Secondary | ICD-10-CM | POA: Insufficient documentation

## 2019-10-17 DIAGNOSIS — Z79899 Other long term (current) drug therapy: Secondary | ICD-10-CM | POA: Insufficient documentation

## 2019-10-17 DIAGNOSIS — Z91013 Allergy to seafood: Secondary | ICD-10-CM | POA: Diagnosis not present

## 2019-10-17 DIAGNOSIS — I5020 Unspecified systolic (congestive) heart failure: Secondary | ICD-10-CM

## 2019-10-17 DIAGNOSIS — Z888 Allergy status to other drugs, medicaments and biological substances status: Secondary | ICD-10-CM | POA: Insufficient documentation

## 2019-10-17 DIAGNOSIS — I5022 Chronic systolic (congestive) heart failure: Secondary | ICD-10-CM

## 2019-10-17 DIAGNOSIS — I110 Hypertensive heart disease with heart failure: Secondary | ICD-10-CM | POA: Insufficient documentation

## 2019-10-17 DIAGNOSIS — I5023 Acute on chronic systolic (congestive) heart failure: Secondary | ICD-10-CM | POA: Diagnosis not present

## 2019-10-17 DIAGNOSIS — Z7901 Long term (current) use of anticoagulants: Secondary | ICD-10-CM | POA: Diagnosis not present

## 2019-10-17 DIAGNOSIS — R0600 Dyspnea, unspecified: Secondary | ICD-10-CM

## 2019-10-17 DIAGNOSIS — E282 Polycystic ovarian syndrome: Secondary | ICD-10-CM | POA: Insufficient documentation

## 2019-10-17 DIAGNOSIS — E0821 Diabetes mellitus due to underlying condition with diabetic nephropathy: Secondary | ICD-10-CM

## 2019-10-17 DIAGNOSIS — I10 Essential (primary) hypertension: Secondary | ICD-10-CM

## 2019-10-17 DIAGNOSIS — G4733 Obstructive sleep apnea (adult) (pediatric): Secondary | ICD-10-CM | POA: Insufficient documentation

## 2019-10-17 DIAGNOSIS — I480 Paroxysmal atrial fibrillation: Secondary | ICD-10-CM | POA: Insufficient documentation

## 2019-10-17 DIAGNOSIS — I428 Other cardiomyopathies: Secondary | ICD-10-CM | POA: Diagnosis not present

## 2019-10-17 DIAGNOSIS — R0609 Other forms of dyspnea: Secondary | ICD-10-CM

## 2019-10-17 DIAGNOSIS — Z8249 Family history of ischemic heart disease and other diseases of the circulatory system: Secondary | ICD-10-CM | POA: Insufficient documentation

## 2019-10-17 LAB — COMPREHENSIVE METABOLIC PANEL
ALT: 18 U/L (ref 0–44)
AST: 24 U/L (ref 15–41)
Albumin: 2.8 g/dL — ABNORMAL LOW (ref 3.5–5.0)
Alkaline Phosphatase: 118 U/L (ref 38–126)
Anion gap: 8 (ref 5–15)
BUN: 10 mg/dL (ref 6–20)
CO2: 21 mmol/L — ABNORMAL LOW (ref 22–32)
Calcium: 8.8 mg/dL — ABNORMAL LOW (ref 8.9–10.3)
Chloride: 105 mmol/L (ref 98–111)
Creatinine, Ser: 0.67 mg/dL (ref 0.44–1.00)
GFR calc Af Amer: >60 mL/min (ref >60)
GFR calc non Af Amer: >60 mL/min (ref >60)
Glucose, Bld: 250 mg/dL — ABNORMAL HIGH (ref 70–99)
Potassium: 4.1 mmol/L (ref 3.5–5.1)
Sodium: 134 mmol/L — ABNORMAL LOW (ref 135–145)
Total Bilirubin: 1.3 mg/dL — ABNORMAL HIGH (ref 0.3–1.2)
Total Protein: 7.4 g/dL (ref 6.5–8.1)

## 2019-10-17 NOTE — Progress Notes (Signed)
Advanced Heart Failure Clinic Note   Referring Physician: PCP: [name], DO PCP-Cardiologist: Dr. Aundra Dubin  EP- Dr. Curt Bears   HPI: 44 y.o. with history of type 2 diabetes (poorly controlled, last hgbA1c > 15), poorly controlled HTN, hyperlipidemia, paroxysmal atrial fibrillation, and chronic systolic CHF of uncertain etiology.  Patient had an echo in 6/20 as outpatient due to exertional dyspnea.  This showed EF 20-25%, moderate RV dysfunction.  She was also noted to have paroxysmal atrial fibrillation.  She was started on Xarelto.  In 7/20, she was admitted with DKA. In 8/20, she was admitted with CHF exacerbation and diuresed.  She was sent home on Lasix 40 mg po bid.   Readmitted on 9/2 with CHF exacerbation, in the setting of elevated BP.  Diuresed with IV lasix.  LHC showed nonobstructive CAD.  RHC showed R>L heart failure with low PAPI suggesting significant RV dysfunction. She was transitioned to PO torsemide and treated w/ guidelines directed medical therapy w/ Entresto, Spironolactone, Coreg and digoxin. She was also transitioned off of Xarelto and placed on Coumadin.  Readmitted 2/0/3559 with A/C systolic heart failure. She had not been taking her lasix because she says it was on her AVS at an appointment to stop it.  Diuresed with IV lasix and transitioned to torsemide 40 mg daily. Weight day before discharge was 190 pounds.   Today she returns for HF follow up.Overall feeling fine. SOB with steps. Denies PND/Orthopnea. Appetite ok. No fever or chills. Weight at home 185 pounds. Taking all medications but has not had morning meds today. Has difficult paying for medications. Has difficulty paying bills at home. Followed by home health. Working part time as a Recruitment consultant.  She does not drive. Her daughter and 2 grandchilren live with her.   Past Medical History:  Diagnosis Date   Abnormal Pap smear    Acute on chronic combined systolic and diastolic CHF (congestive heart failure)  ([place]) 02/19/2019   Acute on chronic congestive heart failure (HCC)    Anemia    Asthma    Atrial fibrillation ([place])    Diabetes mellitus    DKA, type 2 ([place]) 02/06/2019   Heart failure with reduced ejection fraction ([place])    Hypertension    Major depressive disorder 02/08/2012   Reports that 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     Migraines    Morbid obesity ([place]) 02/06/2019   Obstructive sleep apnea 06/30/2015   Polycystic ovarian disease     Current Outpatient Medications  Medication Sig Dispense Refill   Accu-Chek Softclix Lancets lancets Use as instructed 100 each 12   Accu-Chek Softclix Lancets lancets Check cbg 2 times daily 100 each 12   albuterol (VENTOLIN HFA) 108 (90 Base) MCG/ACT inhaler Inhale 2 puffs into the lungs every 6 (six) hours as needed for wheezing or shortness of breath. 18 g 1   atorvastatin (LIPITOR) 40 MG tablet Take 1 tablet (40 mg total) by mouth daily at 6 PM. 30 tablet 3   Blood Glucose Monitoring Suppl (ACCU-CHEK AVIVA PLUS) w/Device KIT 1 application by Does not apply route QID. Check fasting sugars as well as throughout the day 1 kit 0   carvedilol (COREG) 6.25 MG tablet Take 2 tablets (12.5 mg total) by mouth 2 (two) times daily with a meal. 60 tablet 3   digoxin (LANOXIN) 0.125 MG tablet Take 1 tablet (0.125 mg total) by mouth daily. 30 tablet  3   empagliflozin (JARDIANCE) 25 MG TABS tablet Take 25 mg by mouth daily. 90 tablet 3   gabapentin (NEURONTIN) 100 MG capsule Take 1 capsule (100 mg total) by mouth every 8 (eight) hours. 90 capsule 0   glucose blood (ACCU-CHEK AVIVA PLUS) test strip Check CBG 2 times per day 100 each 12   glucose blood (AGAMATRIX PRESTO TEST) test strip Use as instructed 100 each 12   insulin glargine (LANTUS) 100 UNIT/ML injection Inject 0.22 mLs (22 Units total) into the skin daily. 10 mL 11   Lancets Misc. (ACCU-CHEK SOFTCLIX LANCET DEV) KIT  1 application by Does not apply route 2 (two) times daily. 1 kit 0   meclizine (ANTIVERT) 12.5 MG tablet Take 1 tablet (12.5 mg total) by mouth 3 (three) times daily as needed for dizziness. 30 tablet 0   methocarbamol (ROBAXIN) 500 MG tablet Take 1 tablet (500 mg total) by mouth every 8 (eight) hours as needed for muscle spasms. 30 tablet 0   potassium chloride SA (KLOR-CON) 20 MEQ tablet Take 1 tablet every Monday, Wednesday, and Friday. 30 tablet 3   sacubitril-valsartan (ENTRESTO) 49-51 MG Take 1 tablet by mouth 2 (two) times daily. 60 tablet 3   spironolactone (ALDACTONE) 25 MG tablet Take 1 tablet (25 mg total) by mouth daily. 30 tablet 3   terconazole (TERAZOL 3) 0.8 % vaginal cream Place 1 applicator vaginally at bedtime. 20 g 0   torsemide (DEMADEX) 20 MG tablet Take 2 tablets (40 mg total) by mouth daily. 60 tablet 3   warfarin (COUMADIN) 7.5 MG tablet Take 1 tablet (7.5 mg total) by mouth daily at 6 PM. 30 tablet 3   No current facility-administered medications for this encounter.    Allergies  Allergen Reactions   Shellfish Allergy Swelling    Airway involvement including EMS - Has Epi-Pen   Dulaglutide Nausea And Vomiting and Other (See Comments)    Multiple episodes of vomiting over multiple days   Metformin And Related Diarrhea      Social History   Socioeconomic History   Marital status: Single    Spouse name: Not on file   Number of children: Not on file   Years of education: Not on file   Highest education level: Not on file  Occupational History   Not on file  Tobacco Use   Smoking status: Never Smoker   Smokeless tobacco: Never Used  Substance and Sexual Activity   Alcohol use: No    Alcohol/week: 0.0 standard drinks   Drug use: No   Sexual activity: Yes  Other Topics Concern   Not on file  Social History Narrative   Not on file   Social Determinants of Health   Financial Resource Strain:    Difficulty of Paying Living  Expenses:   Food Insecurity:    Worried About Charity fundraiser in the Last Year:    Arboriculturist in the Last Year:   Transportation Needs:    Film/video editor (Medical):    Lack of Transportation (Non-Medical):   Physical Activity:    Days of Exercise per Week:    Minutes of Exercise per Session:   Stress:    Feeling of Stress :   Social Connections:    Frequency of Communication with Friends and Family:    Frequency of Social Gatherings with Friends and Family:    Attends Religious Services:    Active Member of Clubs or Organizations:    Attends  Music therapist:    Marital Status:   Intimate Partner Violence:    Fear of Current or Ex-Partner:    Emotionally Abused:    Physically Abused:    Sexually Abused:       Family History  Problem Relation Age of Onset   Diabetes Mother    Hypertension Mother    Hypertension Father    Diabetes Father     Vitals:   10/17/19 1017  BP: (!) 154/108  Pulse: 94  SpO2: 96%  Weight: 85.5 kg (188 lb 6.4 oz)     PHYSICAL EXAM: General:  Well appearing. No resp difficulty HEENT: normal Neck: supple. no JVD. Carotids 2+ bilat; no bruits. No lymphadenopathy or thryomegaly appreciated. Cor: PMI nondisplaced. Regular rate & rhythm. No rubs, gallops or murmurs. Lungs: clear Abdomen: soft, nontender, nondistended. No hepatosplenomegaly. No bruits or masses. Good bowel sounds. Extremities: no cyanosis, clubbing, rash, edema Neuro: alert & orientedx3, cranial nerves grossly intact. moves all 4 extremities w/o difficulty. Affect pleasant    ASSESSMENT & PLAN:  1. Acute on chronic systolic CHF:  Nonischemic cardiomyopathy, diagnosed in 6/20. Repeat Echo 03/2019 showed EF 20-25%, diffuse hypokinesis, LV thrombus, moderate RV systolic dysfunction, mild MR, moderate TR. She has a history of paroxysmal atrial fibrillation but seems to be mostly in sinus rhythm. This is not likely to be a  tachycardia-mediated cardiomyopathy. Poorly controlled diabetes itself can cause a cardiomyopathy, as can poorly controlled HTN. Myocarditis is a potential cause of her cardiomyopathy. [place] 9/20 showed nonobstructive CAD.  RHC showed R>L heart failure with low PAPI suggesting significant RV dysfunction.  - ECHO most recent admit 09/2019 EF < 20%.  -NYHA III. Volume status stable. Continue torsemide 40 mg daily.  - Continue Entresto to 49-51 mg twice a day . Marland Kitchen  - Continue spironolactone 25 mg daily.  - Continue  Coreg to 12.5 mg bid  - Continue digoxin.  - I will not up titrate meds because she has not had morning meds.  - She previously had been trying to get pregnant (has been difficult with polycystic ovarian syndrome). She has 2 grown children already. We have discussed this again today. With her cardiomyopathy and active CHF, pregnancy would create a significant mortality risk for her. A number of her cardiac meds to control her CHF are feto-toxic. She understands this risk and is no longer trying to get pregnant. Knows importance of using contraception to avoid pregnancy.  2. 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  - Compliant w/ coumadin. INRs followed at [facility].  - No bleeding issues.  3. Atrial fibrillation: Paroxysmal.  - Regular pulse on exam.  - Continue Digoxin. - Continue Coreg to 12.5 mg bid  - Continue warfarin.  INRs followed by PCP - Would consider atrial fibrillation ablation in the future given [facility]STLE-HF data.  4. Type 2 diabetes: Poor control, per PCP.   - Hgb A1C 11. She just started back on insulin this month.  - she is on Jardiance but has been out for 3 days and cant afford it. -. Refer to pharmacy for patient assistanc.e  5. Hypertension:  Elevated but has not had medications.   Referred to HFSW for assistance with medications, insurance, and HF Paramedicine.   Follow up in 2 weeks. She will need close follow up.    Darrick Grinder, NP 10/17/19

## 2019-10-17 NOTE — Progress Notes (Signed)
Paramedicine Initial Assessment:  Housing:  In what kind of housing do you live? House/apt/trailer/shelter? apt  Do you rent/pay a mortgage/own? rent  Do you live with anyone? Daughter and two grandchildren  Are you currently worried about losing your housing? Has $550 past due for rent this month but can't be evicted yet because of the [place] protections.  Within the past 12 months have you ever stayed outside, in a car, tent, a shelter, or temporarily with someone? no  Within the past 12 months have you been unable to get utilities when it was really needed?no  Social:  What is your current marital status? single  Do you have any children? Yes- daughter  Do you have family or friends who live locally? Boyfriend  Food:  Within the past 12 months were you ever worried that food would run out before you got money to buy more?  Within the past 20months have you run out of food and didn't have money to buy more?  Income:  What is your current source of income? Working part time  How hard is it for you to pay for the basics like food housing, medical care, and utilities? hard  Do you have outstanding medical bills? yes  Insurance:  Are you currently insured? no  Do you have prescription coverage? no  If no insurance, have you applied for coverage (Medicaid, disability, marketplace etc)? No- assisted pt in making disability appt  Transportation:  Do you have transportation to your medical appointments? sometimes   If yes, how? Has her boyfriend drive her.  In the past 12 months has lack of transportation kept you from medical appts or from getting medications?  In the past 12 months has lack of transportation kept you from meetings, work, or getting things you needed?   Daily Health Needs: Do you have a working scale at home? yes  Do you ever take your medications differently than prescribed? Yes due to inability to afford medications  Do you have issues affording  your medications? yes  If yes, has this ever prevented you from obtaining medications? yes  Do you have any concerns with mobility at home? no  Do you use any assistive devices at home or have PCS at home? no  Do you have a PCP? Yes- [name]   Are there any additional barriers you see to getting the care you need? no  CSW will continue to follow through paramedicine program and assist as needed.  [name], LCSW Clinical Social Worker Advanced Heart Failure Clinic Desk#: [phone] Cell#: [phone]

## 2019-10-17 NOTE — Progress Notes (Signed)
Heart and Vascular Care Navigation  10/17/2019  EMON OSUCH [birth year] [id number]  Reason for Referral:  Assessment:  HRT/VAS Care Coordination    Patients Home Cardiology Office  Heart Failure Clinic   Outpatient Care Team  PCP; Music therapist; Social Worker   PCP Name:  [name]   Community Paramedic Name:  referred 10/17/19   Social Worker Name:  Tammy Sours [phone]   Living arrangements for the past 2 months  Apartment   Lives with:  Adult Children; Other (Comment) 2 grandchildren- one of which she states she is the legal guardian for   Patient Current Insurance Coverage  Self-Pay   Patient Has Concern With Paying Medical Bills  Yes   Patient Concerns With Medical Bills  pt has no insurance and multiple recent admissions/ED visits   Medical Bill Referrals:  Medicaid/Disability   Does Patient Have Prescription Coverage?  No   Patient Prescription Assistance Programs  Heart Failure Fund; Patient Assistance Programs   Patient Assistance Programs Medications  submitted [employer] for [employer] and [employer] for [employer]- awaiting determination   Home Assistive Devices/Equipment  Eyeglasses; CBG Meter   DME Agency  NA   [place]  [facility]      Social History: SDOH Screenings   Alcohol Screen:   . Last Alcohol Screening Score (AUDIT):   Depression (PHQ2-9): Low Risk   . PHQ-2 Score: 3  Financial Resource Strain: High Risk  . Difficulty of Paying Living Expenses: Hard  Food Insecurity:   . Worried About Charity fundraiser in the Last Year:   . [place] in the Last Year:   Housing: Medium Risk  . Last Housing Risk Score: 1  Physical Activity:   . Days of Exercise per Week:   . Minutes of Exercise per Session:   Social Connections:   . Frequency of Communication with Friends and Family:   . Frequency of Social Gatherings with Friends and Family:   . Attends Religious Services:   . Active Member of Clubs or Organizations:   . Attends English as a second language teacher Meetings:   Marland Kitchen Marital Status:   Stress:   . Feeling of Stress :   Tobacco Use: Low Risk   . Smoking Tobacco Use: Never Smoker  . Smokeless Tobacco Use: Never Used  Transportation Needs:   . Film/video editor (Medical):   Marland Kitchen Lack of Transportation (Non-Medical):     SDOH Interventions: Financial Resources:  Financial Strain Interventions: Therapist, nutritional wanting to assist with MAD app but had not received paperwork back from pt- CSW will assist with gettng paperwork then [facility] to help pt with MAD).   Also helped pt call [employer] to set up telephonic interview for disability- set for April 12th at 9:30am   Food Insecurity:   receives food stamps- no reported concerns  Housing Insecurity:  Housing Interventions: Other (Comment)([facility] referral for rent assistance as pt owes about $500 in rent currently)  Transportation:    no personal vehicle but boyfriend transports when needed- unable to assess how often this is a concern for making appts.    Other Care Navigation Interventions:   Inpatient/Outpatient Substance Abuse Counseling/Rehab Options Not assessed  Provided Pharmacy assistance resources Heart Failure Fund, Patient Assistance Programs- applications completed for [employer] and given app for [place] [facility] assist  Patient expressed Mental Health concerns No.  Patient Referred to: N/a   Barriers to Care: main concern at this time appears to be  financial   Patient To [id number] financial counseling forms so she can get assistance with Medicaid, complete disability interview on 4/12, apply for assistance with rent  Care Navigation Team To Do: finish pt enrollment with paramedicine and check in regarding medication/ financial forms  CSW will continue to follow and assist as needed  [name], [facility] Desk#: [phone] Cell#: [phone]

## 2019-10-17 NOTE — Addendum Note (Signed)
Encounter addended by: [name], LCSW on: 10/17/2019 3:21 PM  Actions taken: Flowsheet accepted, Clinical Note Signed

## 2019-10-17 NOTE — Telephone Encounter (Signed)
CSW met with pt in the clinic today and assisted in completing medication assistance applications.  [employer] application for [employer] assistance completed and faxed for review- fax confirmation received  [employer] application for Jardiance assistance completed and faxed for review- fax confirmation received  CSW will continue to follow and assist as needed  [name], [facility] Desk#: [phone] Cell#: [phone]

## 2019-10-17 NOTE — Patient Instructions (Signed)
Labs done today, we will call you for abnormal results  Your physician recommends that you schedule a follow-up appointment in: 2 weeks  If you have any questions or concerns before your next appointment please send [place] a message through [place] or call our office at [phone].  At the [facility], you and your health needs are our priority. As part of our continuing mission to provide you with exceptional heart care, we have created designated Provider Care Teams. These Care Teams include your primary Cardiologist (physician) and Advanced Practice Providers (APPs- Physician Assistants and Nurse Practitioners) who all work together to provide you with the care you need, when you need it.   You may see any of the following providers on your designated Care Team at your next follow up: Marland Kitchen Dr Glori Bickers . Dr Loralie Champagne . Darrick Grinder, NP . Lyda Jester, PA . [name], PharmD   Please be sure to bring in all your medications bottles to every appointment.

## 2019-10-21 ENCOUNTER — Telehealth (HOSPITAL_COMMUNITY): Payer: Self-pay | Admitting: Licensed Clinical Social Worker

## 2019-10-21 ENCOUNTER — Ambulatory Visit (INDEPENDENT_AMBULATORY_CARE_PROVIDER_SITE_OTHER): Payer: Self-pay | Admitting: Pharmacist

## 2019-10-21 ENCOUNTER — Telehealth (HOSPITAL_COMMUNITY): Payer: Self-pay | Admitting: Pharmacist

## 2019-10-21 ENCOUNTER — Other Ambulatory Visit: Payer: Self-pay

## 2019-10-21 DIAGNOSIS — I480 Paroxysmal atrial fibrillation: Secondary | ICD-10-CM

## 2019-10-21 DIAGNOSIS — Z5181 Encounter for therapeutic drug level monitoring: Secondary | ICD-10-CM

## 2019-10-21 LAB — POCT INR: INR: 1 — AB (ref 2.0–3.0)

## 2019-10-21 MED FILL — JARDIANCE 25 MG TABLET: 25 | 14 days supply | Qty: 14 | Fill #1

## 2019-10-21 NOTE — Telephone Encounter (Signed)
CSW was able to verify that [facility] was following to assist with Medicaid application.  Financial Counseling had sent forms out but reports never getting anything back from the pt so has not been able to proceed.  CSW able to get forms emailed to me so I can assist with getting pt to sign.  CSW forwarded forms to pt [employer], [place], and requested they take them out to be signed during initial visit which should likely happen this week.  CSW called pt and confirmed she is agreeable to this and willing to sign forms.  CSW will continue to follow and assist as needed  [name], [facility] Desk#: [phone] Cell#: [phone]

## 2019-10-21 NOTE — Telephone Encounter (Signed)
Advanced Heart Failure Patient Advocate Encounter   Patient was approved to receive Jardiance from [facility].  Effective dates: 10/21/19 through 10/20/20  [name], PharmD, BCPS, BCCP, CPP Heart Failure Clinic Pharmacist [phone]

## 2019-10-21 NOTE — Patient Instructions (Signed)
Description   Take 2 tablets today then start taking 1.5 tablets daily except for 1 tablet on Sundays and Thursdays. Recheck INR in 1 week. Call coumadin clinic for any questions [phone].

## 2019-10-22 ENCOUNTER — Encounter: Payer: Self-pay | Admitting: General Practice

## 2019-10-22 NOTE — Progress Notes (Signed)
N

## 2019-10-24 ENCOUNTER — Telehealth (HOSPITAL_COMMUNITY): Payer: Self-pay

## 2019-10-24 NOTE — Telephone Encounter (Signed)
I called [name] to schedule our initial visit. She did not answer so I left a message with my information and requested she call me back as soon as possible to set up a visit.  [name], EMT 10/24/19

## 2019-10-24 NOTE — Telephone Encounter (Signed)
Contacted pt and informed her of below and appt scheduled.[name], CMA

## 2019-10-25 ENCOUNTER — Telehealth (HOSPITAL_COMMUNITY): Payer: Self-pay | Admitting: Pharmacist

## 2019-10-25 NOTE — Telephone Encounter (Signed)
Advanced Heart Failure Patient Advocate Encounter   Patient was approved to receive Entresto from [employer].  Patient ID: [id number] Effective dates: 10/25/19 through 10/24/20  [name], PharmD, BCPS, BCCP, CPP Heart Failure Clinic Pharmacist [phone]

## 2019-10-28 ENCOUNTER — Encounter: Payer: Self-pay | Admitting: Family Medicine

## 2019-10-31 ENCOUNTER — Telehealth (HOSPITAL_COMMUNITY): Payer: Self-pay

## 2019-10-31 NOTE — Telephone Encounter (Addendum)
I called [name] to schedule an initial appointment. She stated she would be able to meet with me tomorrow so we agreed on 11:30.   [name], EMT 10/31/19

## 2019-11-01 ENCOUNTER — Telehealth: Payer: Self-pay | Admitting: *Deleted

## 2019-11-01 ENCOUNTER — Other Ambulatory Visit (HOSPITAL_COMMUNITY): Payer: Self-pay

## 2019-11-01 NOTE — Telephone Encounter (Signed)
Pt is overdue for INR check; Zach paramedic with EMS left a message to see if pt could be seen next week. He states pt has been having transportation issues so the will be assisting her with a ride to appts.  Spoke with Thedore Mins and the pt has a HF Clinic at [facility] and will be able to come to an appt afterwards as she will be getting a cab through the program. Appt set for 4/12 at 1030am and he make sure the pt is aware.

## 2019-11-01 NOTE — Progress Notes (Signed)
Paramedicine Encounter    Patient ID: [name], female    DOB: 05/04/1976, 44 y.o.   MRN: [id number]   Patient Care Team: [name], DO as PCP - General (Family Medicine) Larey Dresser, MD as PCP - Cardiology (Cardiology) Constance Haw, MD as PCP - Electrophysiology (Cardiology) [name], LCSW as Social Worker (Licensed Clinical Social Worker)  Patient Active Problem List   Diagnosis Date Noted  . Hypokalemia 10/13/2019  . Hyponatremia 10/13/2019  . Encounter for therapeutic drug monitoring 10/07/2019  . Acute on chronic congestive heart failure ([place])   . Hypertensive urgency 09/30/2019  . Elevated troponin 09/30/2019  . Hypertensive emergency 09/30/2019  . Attempting to conceive 03/10/2019  . Acute on chronic systolic CHF (congestive heart failure) ([place]) 02/19/2019  . Asthma   . Yeast infection 02/06/2019  . Right ventricular failure ([place]) 02/06/2019  . Morbid obesity ([place]) 02/06/2019  . HFrEF (heart failure with reduced ejection fraction) ([place]) 12/31/2018  . Paroxysmal atrial fibrillation ([place]) 12/31/2018  . Anxiety 12/31/2018  . Hyperlipidemia associated with type 2 diabetes mellitus ([place]) 10/30/2017  . Obstructive sleep apnea 06/30/2015  . Anemia due to blood loss, chronic 06/27/2012  . Allergic rhinitis 06/27/2012  . Major depressive disorder 02/08/2012  . Hypertension associated with diabetes ([place]) 12/04/2011  . Dysmenorrhea 11/22/2011  . DM2 (diabetes mellitus, type 2) ([place]) 05/19/2011  . PCOS (polycystic ovarian syndrome) 05/19/2011  . Hydrosalpinx 08/21/2009    Current Outpatient Medications:  .  Accu-Chek Softclix Lancets lancets, Use as instructed, Disp: 100 each, Rfl: 12 .  Accu-Chek Softclix Lancets lancets, Check cbg 2 times daily, Disp: 100 each, Rfl: 12 .  albuterol (VENTOLIN HFA) 108 (90 Base) MCG/ACT inhaler, Inhale 2 puffs into the lungs every 6 (six) hours as needed for wheezing or shortness of breath., Disp: 18 g, Rfl: 1 .   atorvastatin (LIPITOR) 40 MG tablet, Take 1 tablet (40 mg total) by mouth daily at 6 PM., Disp: 30 tablet, Rfl: 3 .  Blood Glucose Monitoring Suppl (ACCU-CHEK AVIVA PLUS) w/Device KIT, 1 application by Does not apply route QID. Check fasting sugars as well as throughout the day, Disp: 1 kit, Rfl: 0 .  carvedilol (COREG) 6.25 MG tablet, Take 2 tablets (12.5 mg total) by mouth 2 (two) times daily with a meal., Disp: 60 tablet, Rfl: 3 .  digoxin (LANOXIN) 0.125 MG tablet, Take 1 tablet (0.125 mg total) by mouth daily., Disp: 30 tablet, Rfl: 3 .  gabapentin (NEURONTIN) 100 MG capsule, Take 1 capsule (100 mg total) by mouth every 8 (eight) hours., Disp: 90 capsule, Rfl: 0 .  insulin glargine (LANTUS) 100 UNIT/ML injection, Inject 0.22 mLs (22 Units total) into the skin daily., Disp: 10 mL, Rfl: 11 .  meclizine (ANTIVERT) 12.5 MG tablet, Take 1 tablet (12.5 mg total) by mouth 3 (three) times daily as needed for dizziness., Disp: 30 tablet, Rfl: 0 .  methocarbamol (ROBAXIN) 500 MG tablet, Take 1 tablet (500 mg total) by mouth every 8 (eight) hours as needed for muscle spasms., Disp: 30 tablet, Rfl: 0 .  potassium chloride SA (KLOR-CON) 20 MEQ tablet, Take 1 tablet every Monday, Wednesday, and Friday., Disp: 30 tablet, Rfl: 3 .  sacubitril-valsartan (ENTRESTO) 49-51 MG, Take 1 tablet by mouth 2 (two) times daily., Disp: 60 tablet, Rfl: 3 .  spironolactone (ALDACTONE) 25 MG tablet, Take 1 tablet (25 mg total) by mouth daily., Disp: 30 tablet, Rfl: 3 .  torsemide (DEMADEX) 20 MG tablet, Take 2 tablets (  40 mg total) by mouth daily., Disp: 60 tablet, Rfl: 3 .  warfarin (COUMADIN) 7.5 MG tablet, Take 1 tablet (7.5 mg total) by mouth daily at 6 PM., Disp: 30 tablet, Rfl: 3 .  empagliflozin (JARDIANCE) 25 MG TABS tablet, Take 25 mg by mouth daily. (Patient not taking: Reported on 10/17/2019), Disp: 90 tablet, Rfl: 3 .  glucose blood (ACCU-CHEK AVIVA PLUS) test strip, Check CBG 2 times per day, Disp: 100 each, Rfl:  12 .  glucose blood (AGAMATRIX PRESTO TEST) test strip, Use as instructed, Disp: 100 each, Rfl: 12 .  Lancets Misc. (ACCU-CHEK SOFTCLIX LANCET DEV) KIT, 1 application by Does not apply route 2 (two) times daily., Disp: 1 kit, Rfl: 0 .  terconazole (TERAZOL 3) 0.8 % vaginal cream, Place 1 applicator vaginally at bedtime., Disp: 20 g, Rfl: 0 Allergies  Allergen Reactions  . Shellfish Allergy Swelling    Airway involvement including EMS - Has Epi-Pen  . Dulaglutide Nausea And Vomiting and Other (See Comments)    Multiple episodes of vomiting over multiple days  . Metformin And Related Diarrhea      Social History   Socioeconomic History  . Marital status: Single    Spouse name: Not on file  . Number of children: Not on file  . Years of education: Not on file  . Highest education level: Not on file  Occupational History  . Not on file  Tobacco Use  . Smoking status: Never Smoker  . Smokeless tobacco: Never Used  Substance and Sexual Activity  . Alcohol use: No    Alcohol/week: 0.0 standard drinks  . Drug use: No  . Sexual activity: Yes  Other Topics Concern  . Not on file  Social History Narrative  . Not on file   Social Determinants of Health   Financial Resource Strain: High Risk  . Difficulty of Paying Living Expenses: Hard  Food Insecurity:   . Worried About Charity fundraiser in the Last Year:   . Arboriculturist in the Last Year:   Transportation Needs:   . Film/video editor (Medical):   Marland Kitchen Lack of Transportation (Non-Medical):   Physical Activity:   . Days of Exercise per Week:   . Minutes of Exercise per Session:   Stress:   . Feeling of Stress :   Social Connections:   . Frequency of Communication with Friends and Family:   . Frequency of Social Gatherings with Friends and Family:   . Attends Religious Services:   . Active Member of Clubs or Organizations:   . Attends Archivist Meetings:   Marland Kitchen Marital Status:   Intimate Partner Violence:    . Fear of Current or Ex-Partner:   . Emotionally Abused:   Marland Kitchen Physically Abused:   . Sexually Abused:     Physical Exam Cardiovascular:     Rate and Rhythm: Normal rate and regular rhythm.     Pulses: Normal pulses.  Pulmonary:     Effort: Pulmonary effort is normal.     Breath sounds: Normal breath sounds.  Musculoskeletal:        General: Normal range of motion.     Right lower leg: No edema.     Left lower leg: No edema.  Skin:    General: Skin is warm and dry.     Capillary Refill: Capillary refill takes less than 2 seconds.  Neurological:     Mental Status: She is alert and oriented to person, place,  and time.  Psychiatric:        Mood and Affect: Mood normal.         Future Appointments  Date Time Provider Rodney  11/04/2019  9:00 AM [facility] PA/NP [facility] None  11/07/2019  9:50 AM Bland, Nicki Reaper, DO [facility]    BP (!) 142/106 (BP Location: Right Arm, Patient Position: Sitting, Cuff Size: Large)   Pulse 90   Resp 16   Wt 186 lb (84.4 kg)   SpO2 98%   BMI 37.57 kg/m   Weight yesterday- Did not weigh Last visit weight- n/a  Ms Divirgilio was seen at home today for our initial visit. She reported feeling generally well, denying chest pain, SOB, headache, dizziness, orthopnea, fever or cough. She stated she has been compliant with her medications however upon close inspection there were several areas of concern. Upon looking at her pillbox, I noted that she was only taking Entresto once daily and did not have enough to make it through the weekend if taken correctly. I contacted [employer] and was able to schedule delivery for Tuesday. She is also completely out of Jardiance so I contacted [facility] and they advised the shipment was delivered to her mailbox yesterday. Ms Hadaway was unable to confirm this because the key to her mailbox was in her boyfriend's car but she stated she would check when he picks her up for work this afternoon. I asked that she let me  know ASAP if it had been delivered so I knew whether or not to obtain a sample pack for her. She was understanding and agreeable. Lastly she was not taking Gabapentin TID as ordered because she was unclear on its purpose. After talking about it at length it seems it may have been given while she was inpatient for leg pain. She said this pain remains intermittent but she would prefer to only take the gabapentin BID since that is how she has been taking it. I will obtain a sample bottle of Entresto an deliver it this afternoon to get her through the weekend. Her other medications were verified and her pillbox was filled. I will follow up next week.   [facility]- [phone] should be in [employer] scheduled for Tuesday   [place], [place] 11/01/19  ACTION: Home visit completed Next visit planned for 1 week

## 2019-11-01 NOTE — Progress Notes (Signed)
I returned to Ms Thorpe' house to deliver the sample bottle of Entresto and see if she found the Jardiance in her mailbox. She was not home but her daughter was there and allowed me access to the pillbox so I could add Entresto to the necessary spots. Her daughter was unaware if she had found Jardiance in the mailbox so I was unable to reconcile that issue at this time. I will follow up next week.   [name], EMT  11/01/19

## 2019-11-04 ENCOUNTER — Ambulatory Visit (HOSPITAL_COMMUNITY)
Admission: RE | Admit: 2019-11-04 | Discharge: 2019-11-04 | Disposition: A | Discharge status: Home / Self Care | Payer: Medicaid Other | Source: Ambulatory Visit | Attending: Cardiology | Admitting: Cardiology

## 2019-11-04 ENCOUNTER — Encounter (HOSPITAL_COMMUNITY): Payer: Self-pay

## 2019-11-04 ENCOUNTER — Other Ambulatory Visit: Payer: Self-pay

## 2019-11-04 VITALS — BP 120/94 | HR 99 | Wt 180.2 lb

## 2019-11-04 DIAGNOSIS — E282 Polycystic ovarian syndrome: Secondary | ICD-10-CM | POA: Insufficient documentation

## 2019-11-04 DIAGNOSIS — Z7901 Long term (current) use of anticoagulants: Secondary | ICD-10-CM | POA: Diagnosis not present

## 2019-11-04 DIAGNOSIS — Z6836 Body mass index (BMI) 36.0-36.9, adult: Secondary | ICD-10-CM | POA: Insufficient documentation

## 2019-11-04 DIAGNOSIS — G4733 Obstructive sleep apnea (adult) (pediatric): Secondary | ICD-10-CM | POA: Insufficient documentation

## 2019-11-04 DIAGNOSIS — I2510 Atherosclerotic heart disease of native coronary artery without angina pectoris: Secondary | ICD-10-CM | POA: Insufficient documentation

## 2019-11-04 DIAGNOSIS — I480 Paroxysmal atrial fibrillation: Secondary | ICD-10-CM | POA: Insufficient documentation

## 2019-11-04 DIAGNOSIS — E785 Hyperlipidemia, unspecified: Secondary | ICD-10-CM | POA: Diagnosis not present

## 2019-11-04 DIAGNOSIS — Z794 Long term (current) use of insulin: Secondary | ICD-10-CM | POA: Diagnosis not present

## 2019-11-04 DIAGNOSIS — Z79899 Other long term (current) drug therapy: Secondary | ICD-10-CM | POA: Insufficient documentation

## 2019-11-04 DIAGNOSIS — I110 Hypertensive heart disease with heart failure: Secondary | ICD-10-CM | POA: Diagnosis not present

## 2019-11-04 DIAGNOSIS — E119 Type 2 diabetes mellitus without complications: Secondary | ICD-10-CM | POA: Diagnosis not present

## 2019-11-04 DIAGNOSIS — I5023 Acute on chronic systolic (congestive) heart failure: Secondary | ICD-10-CM | POA: Insufficient documentation

## 2019-11-04 DIAGNOSIS — I428 Other cardiomyopathies: Secondary | ICD-10-CM | POA: Insufficient documentation

## 2019-11-04 DIAGNOSIS — J45909 Unspecified asthma, uncomplicated: Secondary | ICD-10-CM | POA: Diagnosis not present

## 2019-11-04 DIAGNOSIS — I5022 Chronic systolic (congestive) heart failure: Secondary | ICD-10-CM

## 2019-11-04 LAB — BASIC METABOLIC PANEL
Anion gap: 13 (ref 5–15)
BUN: 22 mg/dL — ABNORMAL HIGH (ref 6–20)
CO2: 24 mmol/L (ref 22–32)
Calcium: 9.2 mg/dL (ref 8.9–10.3)
Chloride: 99 mmol/L (ref 98–111)
Creatinine, Ser: 1.16 mg/dL — ABNORMAL HIGH (ref 0.44–1.00)
GFR calc Af Amer: >60 mL/min (ref >60)
GFR calc non Af Amer: 58 mL/min — ABNORMAL LOW (ref >60)
Glucose, Bld: 226 mg/dL — ABNORMAL HIGH (ref 70–99)
Potassium: 4.3 mmol/L (ref 3.5–5.1)
Sodium: 136 mmol/L (ref 135–145)

## 2019-11-04 LAB — DIGOXIN LEVEL: Digoxin Level: <0.2 ng/mL — ABNORMAL LOW (ref 0.8–2.0)

## 2019-11-04 NOTE — Patient Instructions (Signed)
No medication changes today!   Labs today We will only contact you if something comes back abnormal or we need to make some changes. Otherwise no news is good news!   Your physician recommends that you schedule a follow-up appointment in: 3 weeks with the Pharmacist and 8 weeks with Dr Aundra Dubin.    Please call office at [phone] option 2 if you have any questions or concerns.    At the [facility], you and your health needs are our priority. As part of our continuing mission to provide you with exceptional heart care, we have created designated Provider Care Teams. These Care Teams include your primary Cardiologist (physician) and Advanced Practice Providers (APPs- Physician Assistants and Nurse Practitioners) who all work together to provide you with the care you need, when you need it.   You may see any of the following providers on your designated Care Team at your next follow up: Marland Kitchen Dr Glori Bickers . Dr Loralie Champagne . Darrick Grinder, NP . Lyda Jester, PA . [name], PharmD   Please be sure to bring in all your medications bottles to every appointment.

## 2019-11-04 NOTE — Progress Notes (Signed)
Advanced Heart Failure Clinic Note   Referring Physician: PCP: [name], DO PCP-Cardiologist: Dr. Aundra Dubin  EP- Dr. Curt Bears   HPI: 44 y.o. with history of type 2 diabetes (poorly controlled, last hgbA1c > 15), poorly controlled HTN, hyperlipidemia, paroxysmal atrial fibrillation, and chronic systolic CHF of uncertain etiology.  Patient had an echo in 6/20 as outpatient due to exertional dyspnea. This showed EF 20-25%, moderate RV dysfunction.  She was also noted to have paroxysmal atrial fibrillation.  She was started on Xarelto.  In 7/20, she was admitted with DKA. In 8/20, she was admitted with CHF exacerbation and diuresed.  She was sent home on Lasix 40 mg po bid.   Readmitted on 9/2 with CHF exacerbation, in the setting of elevated BP.  Diuresed with IV lasix.  LHC showed nonobstructive CAD.  RHC showed R>L heart failure with low PAPI suggesting significant RV dysfunction. She was transitioned to PO torsemide and treated w/ guidelines directed medical therapy w/ Entresto, Spironolactone, Coreg and digoxin. She was also transitioned off of Xarelto and placed on Coumadin.  Readmitted 02/25/6658 with A/C systolic heart failure. She had not been taking her lasix because she says it was on her AVS to stop it.  Diuresed with IV lasix and transitioned to torsemide 40 mg daily. Weight day before discharge was 190 pounds.   Had recent [facility] f/u on 10/17/19. Overall was feeling fine but noted some mild exertional dyspnea w/ mild-moderate activities. Denied PND/Orthopnea. Wt was stable. Her HF meds were not titrated at that visit as she had not taken her AM meds prior to appt, and provider not sure if BP would tolerate further increase in meds.   She now presents back for f/u to reassess and to try to further titrate meds. She is now followed closely by paramedicine. She has recently been trying to stretch her meds in an effort to extend them longer due to concerns over high cost. She had only been  taking Entresto once daily instead of BID and has not been taking jardiance due to cost. There is also issues w/ poor health literacy, but as noted above, she is now being followed by paramedine to assist w/ meds to help w/ compliance and has also been approved for medication assistance. Per paramedicine, Delene Loll was just received in the mail and she is not taking it twice daily. Vania Rea is expected to arrive in mail this week.    Today in clinic, she reports feeling more tired today but also attributing this to starting her period yesterday. She confirms that she has been taking all meds placed in her pill box by paramedicine. BP today 120/94 (much better than her usual baseline, per pt report). Unfortunately, she did not take her AM meds prior to today's visit. Her wt is down 8 lb since her last visit in March, after increase Entresto to bid.     Past Medical History:  Diagnosis Date  . Abnormal Pap smear   . Acute on chronic combined systolic and diastolic CHF (congestive heart failure) ([place]) 02/19/2019  . Acute on chronic congestive heart failure ([place])   . Anemia   . Asthma   . Atrial fibrillation ([place])   . Diabetes mellitus   . DKA, type 2 ([place]) 02/06/2019  . Heart failure with reduced ejection fraction ([place])   . Hypertension   . Major depressive disorder 02/08/2012   Reports that her infertility is a major cause of her depression Reports Prozac has worked in the past for  her.    . Migraines   . Morbid obesity ([place]) 02/06/2019  . Obstructive sleep apnea 06/30/2015  . Polycystic ovarian disease     Current Outpatient Medications  Medication Sig Dispense Refill  . Accu-Chek Softclix Lancets lancets Use as instructed 100 each 12  . Accu-Chek Softclix Lancets lancets Check cbg 2 times daily 100 each 12  . albuterol (VENTOLIN HFA) 108 (90 Base) MCG/ACT inhaler Inhale 2 puffs into the lungs every 6 (six) hours as needed for wheezing or shortness of breath. 18 g 1  . atorvastatin (LIPITOR)  40 MG tablet Take 1 tablet (40 mg total) by mouth daily at 6 PM. 30 tablet 3  . Blood Glucose Monitoring Suppl (ACCU-CHEK AVIVA PLUS) w/Device KIT 1 application by Does not apply route QID. Check fasting sugars as well as throughout the day 1 kit 0  . carvedilol (COREG) 6.25 MG tablet Take 2 tablets (12.5 mg total) by mouth 2 (two) times daily with a meal. 60 tablet 3  . digoxin (LANOXIN) 0.125 MG tablet Take 1 tablet (0.125 mg total) by mouth daily. 30 tablet 3  . empagliflozin (JARDIANCE) 25 MG TABS tablet Take 25 mg by mouth daily. 90 tablet 3  . gabapentin (NEURONTIN) 100 MG capsule Take 1 capsule (100 mg total) by mouth every 8 (eight) hours. 90 capsule 0  . glucose blood (ACCU-CHEK AVIVA PLUS) test strip Check CBG 2 times per day 100 each 12  . glucose blood (AGAMATRIX PRESTO TEST) test strip Use as instructed 100 each 12  . insulin glargine (LANTUS) 100 UNIT/ML injection Inject 0.22 mLs (22 Units total) into the skin daily. 10 mL 11  . Lancets Misc. (ACCU-CHEK SOFTCLIX LANCET DEV) KIT 1 application by Does not apply route 2 (two) times daily. 1 kit 0  . meclizine (ANTIVERT) 12.5 MG tablet Take 1 tablet (12.5 mg total) by mouth 3 (three) times daily as needed for dizziness. 30 tablet 0  . methocarbamol (ROBAXIN) 500 MG tablet Take 1 tablet (500 mg total) by mouth every 8 (eight) hours as needed for muscle spasms. 30 tablet 0  . potassium chloride SA (KLOR-CON) 20 MEQ tablet Take 1 tablet every Monday, Wednesday, and Friday. 30 tablet 3  . sacubitril-valsartan (ENTRESTO) 49-51 MG Take 1 tablet by mouth 2 (two) times daily. 60 tablet 3  . spironolactone (ALDACTONE) 25 MG tablet Take 1 tablet (25 mg total) by mouth daily. 30 tablet 3  . terconazole (TERAZOL 3) 0.8 % vaginal cream Place 1 applicator vaginally at bedtime. 20 g 0  . torsemide (DEMADEX) 20 MG tablet Take 2 tablets (40 mg total) by mouth daily. 60 tablet 3  . warfarin (COUMADIN) 7.5 MG tablet Take 1 tablet (7.5 mg total) by mouth  daily at 6 PM. 30 tablet 3   No current facility-administered medications for this encounter.    Allergies  Allergen Reactions  . Shellfish Allergy Swelling    Airway involvement including EMS - Has Epi-Pen  . Dulaglutide Nausea And Vomiting and Other (See Comments)    Multiple episodes of vomiting over multiple days  . Metformin And Related Diarrhea      Social History   Socioeconomic History  . Marital status: Single    Spouse name: Not on file  . Number of children: Not on file  . Years of education: Not on file  . Highest education level: Not on file  Occupational History  . Not on file  Tobacco Use  . Smoking status: Never Smoker  .  Smokeless tobacco: Never Used  Substance and Sexual Activity  . Alcohol use: No    Alcohol/week: 0.0 standard drinks  . Drug use: No  . Sexual activity: Yes  Other Topics Concern  . Not on file  Social History Narrative  . Not on file   Social Determinants of Health   Financial Resource Strain: High Risk  . Difficulty of Paying Living Expenses: Hard  Food Insecurity:   . Worried About Charity fundraiser in the Last Year:   . Arboriculturist in the Last Year:   Transportation Needs:   . Film/video editor (Medical):   Marland Kitchen Lack of Transportation (Non-Medical):   Physical Activity:   . Days of Exercise per Week:   . Minutes of Exercise per Session:   Stress:   . Feeling of Stress :   Social Connections:   . Frequency of Communication with Friends and Family:   . Frequency of Social Gatherings with Friends and Family:   . Attends Religious Services:   . Active Member of Clubs or Organizations:   . Attends Archivist Meetings:   Marland Kitchen Marital Status:   Intimate Partner Violence:   . Fear of Current or Ex-Partner:   . Emotionally Abused:   Marland Kitchen Physically Abused:   . Sexually Abused:       Family History  Problem Relation Age of Onset  . Diabetes Mother   . Hypertension Mother   . Hypertension Father   .  Diabetes Father     Vitals:   11/04/19 0945  BP: (!) 120/94  Pulse: 99  SpO2: 98%  Weight: 81.7 kg (180 lb 3.2 oz)   PHYSICAL EXAM: General:  Well appearing. No respiratory difficulty HEENT: normal Neck: supple. no JVD. Carotids 2+ bilat; no bruits. No lymphadenopathy or thyromegaly appreciated. Cor: PMI nondisplaced. Regular rate & rhythm. No rubs, gallops or murmurs. Lungs: clear, no wheezing  Abdomen: soft, nontender, nondistended. No hepatosplenomegaly. No bruits or masses. Good bowel sounds. Extremities: no cyanosis, clubbing, rash, edema Neuro: alert & oriented x 3, cranial nerves grossly intact. moves all 4 extremities w/o difficulty. Affect pleasant.   ASSESSMENT & PLAN:  1. Acute on chronic systolic CHF:  Nonischemic cardiomyopathy, diagnosed in 6/20. Repeat Echo 03/2019 showed EF 20-25%, diffuse hypokinesis, LV thrombus, moderate RV systolic dysfunction, mild MR, moderate TR. She has a history of paroxysmal atrial fibrillation but seems to be mostly in sinus rhythm. This is not likely to be a tachycardia-mediated cardiomyopathy. Poorly controlled diabetes itself can cause a cardiomyopathy, as can poorly controlled HTN. Myocarditis is a potential cause of her cardiomyopathy. [place] 9/20 showed nonobstructive CAD.  RHC showed R>L heart failure with low PAPI suggesting significant RV dysfunction.  - ECHO most recent admit 09/2019 EF < 20%.  -NYHA II-III. Volume status stable. Euvolemic on exam. Wt down 8 lb since last clinic, after increasing Entresto back to bid dosing. - Will not titrate meds today as her BP is 120/92 prior to AM meds. Will try to titrate next visit. She was advised to take AM meds prior to appointment. This visit will also be after starting Jardiance (plan to start this week, awaiting mail delivery).   - Continue torsemide 40 mg daily.   - Continue Entresto to 49-51 mg twice a day .   - Continue spironolactone 25 mg daily.  - Continue  Coreg 12.5 mg bid  -  Continue digoxin.  check dig level today  - Check BMP today  -  Continue w/ paramedicine. Appreciate their assistance  - She previously had been trying to get pregnant (has been difficult with polycystic ovarian syndrome). She has 2 grown children already. We have discussed this again today. With her cardiomyopathy and active CHF, pregnancy would create a significant mortality risk for her. A number of her cardiac meds to control her CHF are feto-toxic. She understands this risk and is no longer trying to get pregnant. Knows importance of using contraception to avoid pregnancy.  2. 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  - Compliant w/ coumadin. INRs followed at [facility].  - No bleeding issues.  3. Atrial fibrillation: Paroxysmal.  - Regular pulse on exam. HR controlled.  - Continue Digoxin. Check digoxin level today  - Continue Coreg 12.5 mg bid  - Continue warfarin.  INRs followed by PCP - Would consider atrial fibrillation ablation in the future, if recurrence, given [facility]-HF data.  4. Type 2 diabetes: Poor control, per PCP.   - Hgb A1C 11. She just started back on insulin last month.  - she is expected to start [place] soon. She has been approved for med assistance and waiting mail delivery.  5. Hypertension:  - improved w/ improved med compliance. Appreciated paramedicine's assistance   Follow-up in 3 more weeks w/ PharmD or APP for med titration   Lyda Jester, PA-C 11/04/19

## 2019-11-05 ENCOUNTER — Telehealth: Payer: Self-pay | Admitting: Family Medicine

## 2019-11-05 ENCOUNTER — Telehealth (HOSPITAL_COMMUNITY): Payer: Self-pay | Admitting: Licensed Clinical Social Worker

## 2019-11-05 NOTE — Telephone Encounter (Signed)
Clinical biochemist brought CSW signed release forms for Financial Counseling to assist pt in filing for Medicaid.  CSW scanned documents and sent to assigned Financial Counselor [facility]).  Pt was also unable to make it to coumadin appt yesterday due to transportation issues (taxi was late to pick her up for her clinic appt which made her late for her coumadin appt).  Paramedic rescheduled pt for coumadin check tomorrow and CSW called pt to inform.   Pt set up with transport through [facility] to take to appt tomorrow.  CSW will continue to follow and assist as needed.  [name], LCSW Clinical Social Worker Advanced Heart Failure Clinic Desk#: [phone] Cell#: [phone]

## 2019-11-06 ENCOUNTER — Other Ambulatory Visit: Payer: Self-pay

## 2019-11-06 ENCOUNTER — Ambulatory Visit (INDEPENDENT_AMBULATORY_CARE_PROVIDER_SITE_OTHER): Payer: Self-pay | Admitting: *Deleted

## 2019-11-06 DIAGNOSIS — Z5181 Encounter for therapeutic drug level monitoring: Secondary | ICD-10-CM

## 2019-11-06 DIAGNOSIS — I480 Paroxysmal atrial fibrillation: Secondary | ICD-10-CM

## 2019-11-06 LAB — POCT INR: INR: 2.4 (ref 2.0–3.0)

## 2019-11-06 NOTE — Patient Instructions (Signed)
Description   Continue taking 1.5 tablets daily except for 1 tablet on Sundays and Thursdays. Recheck INR in 2 weeks. Call coumadin clinic for any questions [phone].

## 2019-11-07 ENCOUNTER — Other Ambulatory Visit (HOSPITAL_COMMUNITY): Payer: Self-pay | Admitting: *Deleted

## 2019-11-07 ENCOUNTER — Other Ambulatory Visit (HOSPITAL_COMMUNITY): Payer: Self-pay | Admitting: Cardiology

## 2019-11-07 ENCOUNTER — Ambulatory Visit: Payer: Medicaid Other | Admitting: Family Medicine

## 2019-11-07 ENCOUNTER — Other Ambulatory Visit (HOSPITAL_COMMUNITY): Payer: Self-pay

## 2019-11-07 MED ORDER — WARFARIN SODIUM 7.5 MG PO TABS: 7.5000 mg | ORAL_TABLET | Freq: Every day | ORAL | 3 refills | Status: DC

## 2019-11-07 MED ORDER — DIGOXIN 125 MCG PO TABS: 0.1250 mg | ORAL_TABLET | Freq: Every day | ORAL | 3 refills | Status: DC

## 2019-11-07 NOTE — Progress Notes (Signed)
Paramedicine Encounter    Patient ID: [name], female    DOB: 17-Sep-1975, 44 y.o.   MRN: [id number]   Patient Care Team: [name], DO as PCP - General (Family Medicine) Larey Dresser, MD as PCP - Cardiology (Cardiology) Constance Haw, MD as PCP - Electrophysiology (Cardiology) [name], LCSW as Social Worker (Licensed Clinical Social Worker)  Patient Active Problem List   Diagnosis Date Noted  . Hypokalemia 10/13/2019  . Hyponatremia 10/13/2019  . Encounter for therapeutic drug monitoring 10/07/2019  . Acute on chronic congestive heart failure ([place])   . Hypertensive urgency 09/30/2019  . Elevated troponin 09/30/2019  . Hypertensive emergency 09/30/2019  . Attempting to conceive 03/10/2019  . Acute on chronic systolic CHF (congestive heart failure) ([place]) 02/19/2019  . Asthma   . Yeast infection 02/06/2019  . Right ventricular failure ([place]) 02/06/2019  . Morbid obesity ([place]) 02/06/2019  . HFrEF (heart failure with reduced ejection fraction) ([place]) 12/31/2018  . Paroxysmal atrial fibrillation ([place]) 12/31/2018  . Anxiety 12/31/2018  . Hyperlipidemia associated with type 2 diabetes mellitus ([place]) 10/30/2017  . Obstructive sleep apnea 06/30/2015  . Anemia due to blood loss, chronic 06/27/2012  . Allergic rhinitis 06/27/2012  . Major depressive disorder 02/08/2012  . Hypertension associated with diabetes ([place]) 12/04/2011  . Dysmenorrhea 11/22/2011  . DM2 (diabetes mellitus, type 2) ([place]) 05/19/2011  . PCOS (polycystic ovarian syndrome) 05/19/2011  . Hydrosalpinx 08/21/2009    Current Outpatient Medications:  .  Accu-Chek Softclix Lancets lancets, Use as instructed, Disp: 100 each, Rfl: 12 .  Accu-Chek Softclix Lancets lancets, Check cbg 2 times daily, Disp: 100 each, Rfl: 12 .  albuterol (VENTOLIN HFA) 108 (90 Base) MCG/ACT inhaler, Inhale 2 puffs into the lungs every 6 (six) hours as needed for wheezing or shortness of breath., Disp: 18 g, Rfl: 1 .   atorvastatin (LIPITOR) 40 MG tablet, Take 1 tablet (40 mg total) by mouth daily at 6 PM., Disp: 30 tablet, Rfl: 3 .  Blood Glucose Monitoring Suppl (ACCU-CHEK AVIVA PLUS) w/Device KIT, 1 application by Does not apply route QID. Check fasting sugars as well as throughout the day, Disp: 1 kit, Rfl: 0 .  carvedilol (COREG) 6.25 MG tablet, Take 2 tablets (12.5 mg total) by mouth 2 (two) times daily with a meal., Disp: 60 tablet, Rfl: 3 .  digoxin (LANOXIN) 0.125 MG tablet, Take 1 tablet (0.125 mg total) by mouth daily., Disp: 30 tablet, Rfl: 3 .  empagliflozin (JARDIANCE) 25 MG TABS tablet, Take 25 mg by mouth daily., Disp: 90 tablet, Rfl: 3 .  gabapentin (NEURONTIN) 100 MG capsule, Take 1 capsule (100 mg total) by mouth every 8 (eight) hours., Disp: 90 capsule, Rfl: 0 .  glucose blood (ACCU-CHEK AVIVA PLUS) test strip, Check CBG 2 times per day, Disp: 100 each, Rfl: 12 .  glucose blood (AGAMATRIX PRESTO TEST) test strip, Use as instructed, Disp: 100 each, Rfl: 12 .  insulin glargine (LANTUS) 100 UNIT/ML injection, Inject 0.22 mLs (22 Units total) into the skin daily., Disp: 10 mL, Rfl: 11 .  Lancets Misc. (ACCU-CHEK SOFTCLIX LANCET DEV) KIT, 1 application by Does not apply route 2 (two) times daily., Disp: 1 kit, Rfl: 0 .  meclizine (ANTIVERT) 12.5 MG tablet, Take 1 tablet (12.5 mg total) by mouth 3 (three) times daily as needed for dizziness., Disp: 30 tablet, Rfl: 0 .  methocarbamol (ROBAXIN) 500 MG tablet, Take 1 tablet (500 mg total) by mouth every 8 (eight) hours as needed  for muscle spasms., Disp: 30 tablet, Rfl: 0 .  potassium chloride SA (KLOR-CON) 20 MEQ tablet, Take 1 tablet every Monday, Wednesday, and Friday., Disp: 30 tablet, Rfl: 3 .  sacubitril-valsartan (ENTRESTO) 49-51 MG, Take 1 tablet by mouth 2 (two) times daily., Disp: 60 tablet, Rfl: 3 .  spironolactone (ALDACTONE) 25 MG tablet, Take 1 tablet (25 mg total) by mouth daily., Disp: 30 tablet, Rfl: 3 .  terconazole (TERAZOL 3) 0.8 %  vaginal cream, Place 1 applicator vaginally at bedtime., Disp: 20 g, Rfl: 0 .  torsemide (DEMADEX) 20 MG tablet, Take 2 tablets (40 mg total) by mouth daily., Disp: 60 tablet, Rfl: 3 .  warfarin (COUMADIN) 7.5 MG tablet, Take 1 tablet (7.5 mg total) by mouth daily at 6 PM., Disp: 30 tablet, Rfl: 3 Allergies  Allergen Reactions  . Shellfish Allergy Swelling    Airway involvement including EMS - Has Epi-Pen  . Dulaglutide Nausea And Vomiting and Other (See Comments)    Multiple episodes of vomiting over multiple days  . Metformin And Related Diarrhea      Social History   Socioeconomic History  . Marital status: Single    Spouse name: Not on file  . Number of children: Not on file  . Years of education: Not on file  . Highest education level: Not on file  Occupational History  . Not on file  Tobacco Use  . Smoking status: Never Smoker  . Smokeless tobacco: Never Used  Substance and Sexual Activity  . Alcohol use: No    Alcohol/week: 0.0 standard drinks  . Drug use: No  . Sexual activity: Yes  Other Topics Concern  . Not on file  Social History Narrative  . Not on file   Social Determinants of Health   Financial Resource Strain: High Risk  . Difficulty of Paying Living Expenses: Hard  Food Insecurity:   . Worried About Charity fundraiser in the Last Year:   . Arboriculturist in the Last Year:   Transportation Needs:   . Film/video editor (Medical):   Marland Kitchen Lack of Transportation (Non-Medical):   Physical Activity:   . Days of Exercise per Week:   . Minutes of Exercise per Session:   Stress:   . Feeling of Stress :   Social Connections:   . Frequency of Communication with Friends and Family:   . Frequency of Social Gatherings with Friends and Family:   . Attends Religious Services:   . Active Member of Clubs or Organizations:   . Attends Archivist Meetings:   Marland Kitchen Marital Status:   Intimate Partner Violence:   . Fear of Current or Ex-Partner:   .  Emotionally Abused:   Marland Kitchen Physically Abused:   . Sexually Abused:     Physical Exam Cardiovascular:     Rate and Rhythm: Normal rate and regular rhythm.     Pulses: Normal pulses.  Pulmonary:     Effort: Pulmonary effort is normal.     Breath sounds: Normal breath sounds.  Musculoskeletal:        General: Normal range of motion.     Right lower leg: No edema.     Left lower leg: No edema.  Skin:    General: Skin is warm and dry.     Capillary Refill: Capillary refill takes less than 2 seconds.  Neurological:     Mental Status: She is alert and oriented to person, place, and time.  Psychiatric:  Mood and Affect: Mood normal.         Future Appointments  Date Time Provider [place]  11/20/2019 10:15 AM [facility]  11/27/2019 10:00 AM [facility] None  12/27/2019 11:20 AM Larey Dresser, MD [facility] None    BP (!) 130/100 (BP Location: Left Arm, Patient Position: Sitting, Cuff Size: Large)   Resp 16   Wt 179 lb (81.2 kg)   SpO2 98%   BMI 36.15 kg/m   Weight yesterday- 180 lb Last visit weight- 186 lb  Ms Lehtinen was seen at home today and reported feeling well. She denied chest pain, SOB, headache, dizziness, orthopnea, fever or cough since our last visit. She stated she has been compliant with her medications and her weight has been stable. Her medications were verified and her pillbox was refilled. We spent approximately 10 minutes reviewing her medications and their purpose together to help her understand them better. I will follow up next week.   [name], EMT 11/07/19  ACTION: Home visit completed Next visit planned for 1 week

## 2019-11-15 ENCOUNTER — Other Ambulatory Visit (HOSPITAL_COMMUNITY): Payer: Self-pay | Admitting: *Deleted

## 2019-11-15 ENCOUNTER — Other Ambulatory Visit (HOSPITAL_COMMUNITY): Payer: Self-pay | Admitting: Cardiology

## 2019-11-15 ENCOUNTER — Other Ambulatory Visit: Payer: Self-pay | Admitting: Family Medicine

## 2019-11-15 ENCOUNTER — Other Ambulatory Visit (HOSPITAL_COMMUNITY): Payer: Self-pay

## 2019-11-15 MED ORDER — CARVEDILOL 6.25 MG PO TABS: 12.5000 mg | ORAL_TABLET | Freq: Two times a day (BID) | ORAL | 3 refills | Status: DC

## 2019-11-15 MED ORDER — DIGOXIN 125 MCG PO TABS: 0.1250 mg | ORAL_TABLET | Freq: Every day | ORAL | 3 refills | Status: DC

## 2019-11-15 MED ORDER — SPIRONOLACTONE 25 MG PO TABS: 25.0000 mg | ORAL_TABLET | Freq: Every day | ORAL | 3 refills | Status: DC

## 2019-11-15 MED ORDER — WARFARIN SODIUM 7.5 MG PO TABS: 7.5000 mg | ORAL_TABLET | Freq: Every day | ORAL | 3 refills | Status: DC

## 2019-11-15 MED ORDER — CARVEDILOL 12.5 MG PO TABS: 12.5000 mg | ORAL_TABLET | Freq: Two times a day (BID) | ORAL | 3 refills | Status: DC

## 2019-11-15 MED ORDER — POTASSIUM CHLORIDE CRYS ER 20 MEQ PO TBCR: EXTENDED_RELEASE_TABLET | ORAL | 3 refills | Status: DC

## 2019-11-15 MED ORDER — TORSEMIDE 20 MG PO TABS: 40.0000 mg | ORAL_TABLET | Freq: Every day | ORAL | 3 refills | Status: DC

## 2019-11-15 MED FILL — DIGOXIN 0.125 MG TABLET: 125 | 30 days supply | Qty: 30 | Fill #0

## 2019-11-15 MED FILL — CARVEDILOL 12.5 MG TABLET: 12.5 | 30 days supply | Qty: 60 | Fill #0

## 2019-11-15 MED FILL — SPIRONOLACTONE 25 MG TABS: 25 | 30 days supply | Qty: 30 | Fill #0

## 2019-11-15 MED FILL — POTASSIUM CHLORIDE CRYS ER: 20 | 28 days supply | Qty: 12 | Fill #0

## 2019-11-15 MED FILL — TORSEMIDE 20 MG TABLET: 20 | 30 days supply | Qty: 60 | Fill #0

## 2019-11-15 MED FILL — WARFARIN SODIUM 7.5 MG TAB: 7.5 | 30 days supply | Qty: 30 | Fill #0

## 2019-11-15 NOTE — Progress Notes (Signed)
Paramedicine Encounter    Patient ID: [name], female    DOB: April 26, 1976, 44 y.o.   MRN: [id number]   Patient Care Team: [name], DO as PCP - General (Family Medicine) Larey Dresser, MD as PCP - Cardiology (Cardiology) Constance Haw, MD as PCP - Electrophysiology (Cardiology) [name], LCSW as Social Worker (Licensed Clinical Social Worker)  Patient Active Problem List   Diagnosis Date Noted  . Hypokalemia 10/13/2019  . Hyponatremia 10/13/2019  . Encounter for therapeutic drug monitoring 10/07/2019  . Acute on chronic congestive heart failure ([place])   . Hypertensive urgency 09/30/2019  . Elevated troponin 09/30/2019  . Hypertensive emergency 09/30/2019  . Attempting to conceive 03/10/2019  . Acute on chronic systolic CHF (congestive heart failure) ([place]) 02/19/2019  . Asthma   . Yeast infection 02/06/2019  . Right ventricular failure ([place]) 02/06/2019  . Morbid obesity ([place]) 02/06/2019  . HFrEF (heart failure with reduced ejection fraction) ([place]) 12/31/2018  . Paroxysmal atrial fibrillation ([place]) 12/31/2018  . Anxiety 12/31/2018  . Hyperlipidemia associated with type 2 diabetes mellitus ([place]) 10/30/2017  . Obstructive sleep apnea 06/30/2015  . Anemia due to blood loss, chronic 06/27/2012  . Allergic rhinitis 06/27/2012  . Major depressive disorder 02/08/2012  . Hypertension associated with diabetes ([place]) 12/04/2011  . Dysmenorrhea 11/22/2011  . DM2 (diabetes mellitus, type 2) ([place]) 05/19/2011  . PCOS (polycystic ovarian syndrome) 05/19/2011  . Hydrosalpinx 08/21/2009    Current Outpatient Medications:  .  atorvastatin (LIPITOR) 40 MG tablet, Take 1 tablet (40 mg total) by mouth daily at 6 PM., Disp: 30 tablet, Rfl: 3 .  empagliflozin (JARDIANCE) 25 MG TABS tablet, Take 25 mg by mouth daily., Disp: 90 tablet, Rfl: 3 .  gabapentin (NEURONTIN) 100 MG capsule, Take 1 capsule (100 mg total) by mouth every 8 (eight) hours., Disp: 90 capsule, Rfl: 0 .   insulin glargine (LANTUS) 100 UNIT/ML injection, Inject 0.22 mLs (22 Units total) into the skin daily., Disp: 10 mL, Rfl: 11 .  meclizine (ANTIVERT) 12.5 MG tablet, Take 1 tablet (12.5 mg total) by mouth 3 (three) times daily as needed for dizziness., Disp: 30 tablet, Rfl: 0 .  sacubitril-valsartan (ENTRESTO) 49-51 MG, Take 1 tablet by mouth 2 (two) times daily., Disp: 60 tablet, Rfl: 3 .  Accu-Chek Softclix Lancets lancets, Use as instructed, Disp: 100 each, Rfl: 12 .  Accu-Chek Softclix Lancets lancets, Check cbg 2 times daily, Disp: 100 each, Rfl: 12 .  albuterol (VENTOLIN HFA) 108 (90 Base) MCG/ACT inhaler, Inhale 2 puffs into the lungs every 6 (six) hours as needed for wheezing or shortness of breath., Disp: 18 g, Rfl: 1 .  Blood Glucose Monitoring Suppl (ACCU-CHEK AVIVA PLUS) w/Device KIT, 1 application by Does not apply route QID. Check fasting sugars as well as throughout the day, Disp: 1 kit, Rfl: 0 .  carvedilol (COREG) 6.25 MG tablet, Take 2 tablets (12.5 mg total) by mouth 2 (two) times daily with a meal., Disp: 60 tablet, Rfl: 3 .  digoxin (LANOXIN) 0.125 MG tablet, Take 1 tablet (0.125 mg total) by mouth daily., Disp: 30 tablet, Rfl: 3 .  glucose blood (ACCU-CHEK AVIVA PLUS) test strip, Check CBG 2 times per day, Disp: 100 each, Rfl: 12 .  glucose blood (AGAMATRIX PRESTO TEST) test strip, Use as instructed, Disp: 100 each, Rfl: 12 .  Lancets Misc. (ACCU-CHEK SOFTCLIX LANCET DEV) KIT, 1 application by Does not apply route 2 (two) times daily., Disp: 1 kit, Rfl: 0 .  methocarbamol (ROBAXIN) 500 MG tablet, Take 1 tablet (500 mg total) by mouth every 8 (eight) hours as needed for muscle spasms. (Patient not taking: Reported on 11/15/2019), Disp: 30 tablet, Rfl: 0 .  potassium chloride SA (KLOR-CON) 20 MEQ tablet, Take 1 tablet every Monday, Wednesday, and Friday., Disp: 30 tablet, Rfl: 3 .  spironolactone (ALDACTONE) 25 MG tablet, Take 1 tablet (25 mg total) by mouth daily., Disp: 30 tablet,  Rfl: 3 .  terconazole (TERAZOL 3) 0.8 % vaginal cream, Place 1 applicator vaginally at bedtime., Disp: 20 g, Rfl: 0 .  torsemide (DEMADEX) 20 MG tablet, Take 2 tablets (40 mg total) by mouth daily., Disp: 60 tablet, Rfl: 3 .  warfarin (COUMADIN) 7.5 MG tablet, Take 1 tablet (7.5 mg total) by mouth daily at 6 PM., Disp: 30 tablet, Rfl: 3 Allergies  Allergen Reactions  . Shellfish Allergy Swelling    Airway involvement including EMS - Has Epi-Pen  . Dulaglutide Nausea And Vomiting and Other (See Comments)    Multiple episodes of vomiting over multiple days  . Metformin And Related Diarrhea      Social History   Socioeconomic History  . Marital status: Single    Spouse name: Not on file  . Number of children: Not on file  . Years of education: Not on file  . Highest education level: Not on file  Occupational History  . Not on file  Tobacco Use  . Smoking status: Never Smoker  . Smokeless tobacco: Never Used  Substance and Sexual Activity  . Alcohol use: No    Alcohol/week: 0.0 standard drinks  . Drug use: No  . Sexual activity: Yes  Other Topics Concern  . Not on file  Social History Narrative  . Not on file   Social Determinants of Health   Financial Resource Strain: High Risk  . Difficulty of Paying Living Expenses: Hard  Food Insecurity:   . Worried About Charity fundraiser in the Last Year:   . Arboriculturist in the Last Year:   Transportation Needs:   . Film/video editor (Medical):   Marland Kitchen Lack of Transportation (Non-Medical):   Physical Activity:   . Days of Exercise per Week:   . Minutes of Exercise per Session:   Stress:   . Feeling of Stress :   Social Connections:   . Frequency of Communication with Friends and Family:   . Frequency of Social Gatherings with Friends and Family:   . Attends Religious Services:   . Active Member of Clubs or Organizations:   . Attends Archivist Meetings:   Marland Kitchen Marital Status:   Intimate Partner Violence:    . Fear of Current or Ex-Partner:   . Emotionally Abused:   Marland Kitchen Physically Abused:   . Sexually Abused:     Physical Exam Cardiovascular:     Rate and Rhythm: Normal rate and regular rhythm.     Pulses: Normal pulses.  Pulmonary:     Effort: Pulmonary effort is normal.     Breath sounds: Normal breath sounds.  Musculoskeletal:        General: Normal range of motion.     Right lower leg: No edema.     Left lower leg: No edema.  Skin:    General: Skin is warm and dry.     Capillary Refill: Capillary refill takes less than 2 seconds.  Neurological:     Mental Status: She is alert and oriented to person, place, and  time.  Psychiatric:        Mood and Affect: Mood normal.         Future Appointments  Date Time Provider [place]  11/20/2019 10:15 AM [facility]  11/27/2019 10:00 AM [facility] None  12/27/2019 11:20 AM Larey Dresser, MD [facility] None    BP (!) 160/116 (BP Location: Left Arm, Patient Position: Sitting, Cuff Size: Normal)   Pulse 81   Resp 16   Wt 178 lb (80.7 kg)   SpO2 98%   BMI 35.95 kg/m   Weight yesterday- did not weigh  Last visit weight- 179 lb  Ms Pogosyan was seen at home today and reported feeling unwell. She stated she has been non-compliant with her medications over the past week due to a dispute with her daughter that left her feeling depressed. She stated she has had some chest discomfort and SOB but denied both at the time of our visit. She was noted to be hypertensive however due to her non-compliance I did not call the HF clinic since there would not be a medication change necessary. Her medications were verified and her pillbox was checked and refilled where necessary. She was out of coumadin so I had her medications moved to the Outpatient pharmacy under the [place]. I will pick up and deliver them later today. I stressed the importance about staying on her medications and pointed out  the correlation of non-compliance and her blood pressure today. She was understanding and stated she would do better going forward.   [name], EMT 11/15/19  ACTION: Home visit completed Next visit planned for this afternoon to deliver medications

## 2019-11-20 ENCOUNTER — Ambulatory Visit (INDEPENDENT_AMBULATORY_CARE_PROVIDER_SITE_OTHER): Payer: Self-pay | Admitting: *Deleted

## 2019-11-20 ENCOUNTER — Other Ambulatory Visit: Payer: Self-pay

## 2019-11-20 DIAGNOSIS — Z5181 Encounter for therapeutic drug level monitoring: Secondary | ICD-10-CM

## 2019-11-20 DIAGNOSIS — I480 Paroxysmal atrial fibrillation: Secondary | ICD-10-CM

## 2019-11-20 LAB — POCT INR: INR: 2.6 (ref 2.0–3.0)

## 2019-11-20 NOTE — Patient Instructions (Signed)
Description   Continue taking 1.5 tablets daily except for 1 tablet on Sundays and Thursdays. Recheck INR in 3 weeks. Call coumadin clinic for any questions [phone].

## 2019-11-22 ENCOUNTER — Telehealth (HOSPITAL_COMMUNITY): Payer: Self-pay | Admitting: Licensed Clinical Social Worker

## 2019-11-22 ENCOUNTER — Other Ambulatory Visit (HOSPITAL_COMMUNITY): Payer: Self-pay

## 2019-11-22 NOTE — Progress Notes (Signed)
Paramedicine Encounter    Patient ID: [name], female    DOB: 1975-10-23, 44 y.o.   MRN: [id number]   Patient Care Team: [name], DO as PCP - General (Family Medicine) Larey Dresser, MD as PCP - Cardiology (Cardiology) Constance Haw, MD as PCP - Electrophysiology (Cardiology) [name], LCSW as Social Worker (Licensed Clinical Social Worker)  Patient Active Problem List   Diagnosis Date Noted  . Hypokalemia 10/13/2019  . Hyponatremia 10/13/2019  . Encounter for therapeutic drug monitoring 10/07/2019  . Acute on chronic congestive heart failure ([place])   . Hypertensive urgency 09/30/2019  . Elevated troponin 09/30/2019  . Hypertensive emergency 09/30/2019  . Attempting to conceive 03/10/2019  . Acute on chronic systolic CHF (congestive heart failure) ([place]) 02/19/2019  . Asthma   . Yeast infection 02/06/2019  . Right ventricular failure ([place]) 02/06/2019  . Morbid obesity ([place]) 02/06/2019  . HFrEF (heart failure with reduced ejection fraction) ([place]) 12/31/2018  . Paroxysmal atrial fibrillation ([place]) 12/31/2018  . Anxiety 12/31/2018  . Hyperlipidemia associated with type 2 diabetes mellitus ([place]) 10/30/2017  . Obstructive sleep apnea 06/30/2015  . Anemia due to blood loss, chronic 06/27/2012  . Allergic rhinitis 06/27/2012  . Major depressive disorder 02/08/2012  . Hypertension associated with diabetes ([place]) 12/04/2011  . Dysmenorrhea 11/22/2011  . DM2 (diabetes mellitus, type 2) ([place]) 05/19/2011  . PCOS (polycystic ovarian syndrome) 05/19/2011  . Hydrosalpinx 08/21/2009    Current Outpatient Medications:  .  Accu-Chek Softclix Lancets lancets, Use as instructed, Disp: 100 each, Rfl: 12 .  Accu-Chek Softclix Lancets lancets, Check cbg 2 times daily, Disp: 100 each, Rfl: 12 .  albuterol (VENTOLIN HFA) 108 (90 Base) MCG/ACT inhaler, Inhale 2 puffs into the lungs every 6 (six) hours as needed for wheezing or shortness of breath., Disp: 18 g, Rfl: 1 .   atorvastatin (LIPITOR) 40 MG tablet, Take 1 tablet (40 mg total) by mouth daily at 6 PM., Disp: 30 tablet, Rfl: 3 .  carvedilol (COREG) 12.5 MG tablet, Take 1 tablet (12.5 mg total) by mouth 2 (two) times daily with a meal., Disp: 30 tablet, Rfl: 3 .  digoxin (LANOXIN) 0.125 MG tablet, Take 1 tablet (0.125 mg total) by mouth daily., Disp: 30 tablet, Rfl: 3 .  empagliflozin (JARDIANCE) 25 MG TABS tablet, Take 25 mg by mouth daily., Disp: 90 tablet, Rfl: 3 .  gabapentin (NEURONTIN) 100 MG capsule, Take 1 capsule (100 mg total) by mouth every 8 (eight) hours., Disp: 90 capsule, Rfl: 0 .  insulin glargine (LANTUS) 100 UNIT/ML injection, Inject 0.22 mLs (22 Units total) into the skin daily., Disp: 10 mL, Rfl: 11 .  potassium chloride SA (KLOR-CON) 20 MEQ tablet, Take 1 tablet every Monday, Wednesday, and Friday., Disp: 30 tablet, Rfl: 3 .  sacubitril-valsartan (ENTRESTO) 49-51 MG, Take 1 tablet by mouth 2 (two) times daily., Disp: 60 tablet, Rfl: 3 .  spironolactone (ALDACTONE) 25 MG tablet, Take 1 tablet (25 mg total) by mouth daily., Disp: 30 tablet, Rfl: 3 .  torsemide (DEMADEX) 20 MG tablet, Take 2 tablets (40 mg total) by mouth daily., Disp: 60 tablet, Rfl: 3 .  warfarin (COUMADIN) 7.5 MG tablet, Take 1 tablet (7.5 mg total) by mouth daily at 6 PM., Disp: 30 tablet, Rfl: 3 .  Blood Glucose Monitoring Suppl (ACCU-CHEK AVIVA PLUS) w/Device KIT, 1 application by Does not apply route QID. Check fasting sugars as well as throughout the day, Disp: 1 kit, Rfl: 0 .  glucose  blood (ACCU-CHEK AVIVA PLUS) test strip, Check CBG 2 times per day, Disp: 100 each, Rfl: 12 .  glucose blood (AGAMATRIX PRESTO TEST) test strip, Use as instructed, Disp: 100 each, Rfl: 12 .  Lancets Misc. (ACCU-CHEK SOFTCLIX LANCET DEV) KIT, 1 application by Does not apply route 2 (two) times daily., Disp: 1 kit, Rfl: 0 .  meclizine (ANTIVERT) 12.5 MG tablet, Take 1 tablet (12.5 mg total) by mouth 3 (three) times daily as needed for  dizziness. (Patient not taking: Reported on 11/22/2019), Disp: 30 tablet, Rfl: 0 .  methocarbamol (ROBAXIN) 500 MG tablet, Take 1 tablet (500 mg total) by mouth every 8 (eight) hours as needed for muscle spasms. (Patient not taking: Reported on 11/15/2019), Disp: 30 tablet, Rfl: 0 .  terconazole (TERAZOL 3) 0.8 % vaginal cream, Place 1 applicator vaginally at bedtime., Disp: 20 g, Rfl: 0 .  traZODone (DESYREL) 50 MG tablet, TAKE 1 TABLET BY MOUTH AT BEDTIME AS NEEDED FOR SLEEP, Disp: 30 tablet, Rfl: 0 Allergies  Allergen Reactions  . Shellfish Allergy Swelling    Airway involvement including EMS - Has Epi-Pen  . Dulaglutide Nausea And Vomiting and Other (See Comments)    Multiple episodes of vomiting over multiple days  . Metformin And Related Diarrhea      Social History   Socioeconomic History  . Marital status: Single    Spouse name: Not on file  . Number of children: Not on file  . Years of education: Not on file  . Highest education level: Not on file  Occupational History  . Not on file  Tobacco Use  . Smoking status: Never Smoker  . Smokeless tobacco: Never Used  Substance and Sexual Activity  . Alcohol use: No    Alcohol/week: 0.0 standard drinks  . Drug use: No  . Sexual activity: Yes  Other Topics Concern  . Not on file  Social History Narrative  . Not on file   Social Determinants of Health   Financial Resource Strain: High Risk  . Difficulty of Paying Living Expenses: Hard  Food Insecurity:   . Worried About Charity fundraiser in the Last Year:   . Arboriculturist in the Last Year:   Transportation Needs:   . Film/video editor (Medical):   Marland Kitchen Lack of Transportation (Non-Medical):   Physical Activity:   . Days of Exercise per Week:   . Minutes of Exercise per Session:   Stress:   . Feeling of Stress :   Social Connections:   . Frequency of Communication with Friends and Family:   . Frequency of Social Gatherings with Friends and Family:   .  Attends Religious Services:   . Active Member of Clubs or Organizations:   . Attends Archivist Meetings:   Marland Kitchen Marital Status:   Intimate Partner Violence:   . Fear of Current or Ex-Partner:   . Emotionally Abused:   Marland Kitchen Physically Abused:   . Sexually Abused:     Physical Exam Cardiovascular:     Rate and Rhythm: Normal rate and regular rhythm.     Pulses: Normal pulses.  Pulmonary:     Effort: Pulmonary effort is normal.     Breath sounds: Normal breath sounds.  Musculoskeletal:        General: Normal range of motion.     Right lower leg: No edema.     Left lower leg: No edema.  Skin:    General: Skin is warm and dry.  Capillary Refill: Capillary refill takes less than 2 seconds.  Neurological:     Mental Status: She is alert and oriented to person, place, and time.  Psychiatric:        Mood and Affect: Mood normal.         Future Appointments  Date Time Provider [place]  11/27/2019 10:00 AM [facility] None  12/11/2019 12:45 PM [facility]  12/27/2019 11:20 AM Larey Dresser, MD [facility] None  01/07/2020  8:30 AM Camnitz, Ocie Doyne, MD [facility]    BP (!) 140/94 (BP Location: Left Arm, Patient Position: Sitting, Cuff Size: Normal)   Resp 16   Wt 180 lb (81.6 kg)   SpO2 98%   BMI 36.36 kg/m   Weight yesterday- 179 lb Last visit weight- 178 lb  Ms Stangelo was seen at home today and reported feeling well. She denied chest pain, SOB, headache, dizziness, orthopnea, fever or cough over the past week. She stated she has been compliant with her medications over the past week and her weight has been stable. Her medications were verified and her pillbox was refilled. I will follow up next week.   [name], EMT 11/22/19  ACTION: Home visit completed Next visit planned for 1 week

## 2019-11-22 NOTE — Telephone Encounter (Signed)
Pt sent her disability application paperwork through [employer] for CSW to assist with filling out.  CSW filled out portions that I could without assistance then called pt to set up a time to compete the rest together.  Pt unable to speak this afternoon but we will plan to set up a time next week to complete and submit together.  CSW will continue to follow and assist as needed  [name], [facility] Desk#: [phone] Cell#: [phone]

## 2019-11-26 ENCOUNTER — Telehealth (HOSPITAL_COMMUNITY): Payer: Self-pay | Admitting: Licensed Clinical Social Worker

## 2019-11-26 NOTE — Telephone Encounter (Signed)
CSW called pt to set up appt to complete disability paperwork- unable to leave a VM but texted pt to inquire about when we could meet this week- awaiting response  [name], [facility] Desk#: [phone] Cell#: [phone]

## 2019-11-26 NOTE — Telephone Encounter (Signed)
Pt returned CSW call and we were able to set appt for 10:30am on Thursday to complete paperwork.    Pt also requesting to cancel pharmacy appt tomorrow due to car issues- able to reschedule pt for 12pm on Thursday  CSW will continue to follow and assist as needed  [name], [place] Worker [facility] Desk#: [phone] Cell#: [phone]

## 2019-11-27 ENCOUNTER — Inpatient Hospital Stay (HOSPITAL_COMMUNITY): Admission: RE | Admit: 2019-11-27 | Payer: Medicaid Other | Source: Ambulatory Visit

## 2019-11-28 ENCOUNTER — Encounter (HOSPITAL_COMMUNITY): Payer: Self-pay

## 2019-11-28 ENCOUNTER — Other Ambulatory Visit: Payer: Self-pay

## 2019-11-28 ENCOUNTER — Ambulatory Visit (HOSPITAL_COMMUNITY)
Admission: RE | Admit: 2019-11-28 | Discharge: 2019-11-28 | Disposition: A | Discharge status: Home / Self Care | Payer: Medicaid Other | Source: Ambulatory Visit | Attending: Internal Medicine | Admitting: Internal Medicine

## 2019-11-28 ENCOUNTER — Telehealth (HOSPITAL_COMMUNITY): Payer: Self-pay | Admitting: Licensed Clinical Social Worker

## 2019-11-28 VITALS — BP 134/84 | HR 91 | Ht 59.0 in | Wt 185.4 lb

## 2019-11-28 DIAGNOSIS — I110 Hypertensive heart disease with heart failure: Secondary | ICD-10-CM | POA: Diagnosis not present

## 2019-11-28 DIAGNOSIS — I5023 Acute on chronic systolic (congestive) heart failure: Secondary | ICD-10-CM | POA: Insufficient documentation

## 2019-11-28 DIAGNOSIS — I513 Intracardiac thrombosis, not elsewhere classified: Secondary | ICD-10-CM | POA: Insufficient documentation

## 2019-11-28 DIAGNOSIS — E1165 Type 2 diabetes mellitus with hyperglycemia: Secondary | ICD-10-CM | POA: Insufficient documentation

## 2019-11-28 DIAGNOSIS — E785 Hyperlipidemia, unspecified: Secondary | ICD-10-CM | POA: Insufficient documentation

## 2019-11-28 DIAGNOSIS — Z7901 Long term (current) use of anticoagulants: Secondary | ICD-10-CM | POA: Diagnosis not present

## 2019-11-28 DIAGNOSIS — I428 Other cardiomyopathies: Secondary | ICD-10-CM | POA: Insufficient documentation

## 2019-11-28 DIAGNOSIS — Z794 Long term (current) use of insulin: Secondary | ICD-10-CM | POA: Diagnosis not present

## 2019-11-28 DIAGNOSIS — I480 Paroxysmal atrial fibrillation: Secondary | ICD-10-CM | POA: Insufficient documentation

## 2019-11-28 DIAGNOSIS — I5022 Chronic systolic (congestive) heart failure: Secondary | ICD-10-CM | POA: Insufficient documentation

## 2019-11-28 DIAGNOSIS — Z79899 Other long term (current) drug therapy: Secondary | ICD-10-CM | POA: Diagnosis not present

## 2019-11-28 DIAGNOSIS — I2510 Atherosclerotic heart disease of native coronary artery without angina pectoris: Secondary | ICD-10-CM | POA: Insufficient documentation

## 2019-11-28 DIAGNOSIS — I5020 Unspecified systolic (congestive) heart failure: Secondary | ICD-10-CM

## 2019-11-28 LAB — BASIC METABOLIC PANEL
Anion gap: 13 (ref 5–15)
BUN: 15 mg/dL (ref 6–20)
CO2: 21 mmol/L — ABNORMAL LOW (ref 22–32)
Calcium: 9.2 mg/dL (ref 8.9–10.3)
Chloride: 99 mmol/L (ref 98–111)
Creatinine, Ser: 1.1 mg/dL — ABNORMAL HIGH (ref 0.44–1.00)
GFR calc Af Amer: >60 mL/min (ref >60)
GFR calc non Af Amer: >60 mL/min (ref >60)
Glucose, Bld: 342 mg/dL — ABNORMAL HIGH (ref 70–99)
Potassium: 3.9 mmol/L (ref 3.5–5.1)
Sodium: 133 mmol/L — ABNORMAL LOW (ref 135–145)

## 2019-11-28 LAB — BRAIN NATRIURETIC PEPTIDE: B Natriuretic Peptide: 76.3 pg/mL (ref 0.0–100.0)

## 2019-11-28 MED ORDER — SACUBITRIL-VALSARTAN 97-103 MG PO TABS: 1.0000 | ORAL_TABLET | Freq: Two times a day (BID) | ORAL | 3 refills | Status: DC

## 2019-11-28 NOTE — Progress Notes (Signed)
PCP: [name], DO PCP-Cardiologist: Dr. Aundra Dubin  EP- Dr. Curt Bears   HPI: 44 y.o.with history of type 2 diabetes (poorly controlled, last hgb A1c 11.1%), poorly controlled HTN, hyperlipidemia, paroxysmal atrial fibrillation, and chronic systolic CHF of uncertain etiology. Patient had an echo in 12/2018 as outpatient due to exertional dyspnea. This showed EF 20-25%, moderate RV dysfunction. She was also noted to have paroxysmal atrial fibrillation. She was started on Xarelto. In 01/2019, she was admitted with DKA. In 02/2019, she was admitted with CHF exacerbation and diuresed. She was sent home on furosemide 40 mg po bid.   Readmitted on 03/2019 with CHF exacerbation, in the setting of elevated BP. Diuresed with IV furosemide.  LHC showed nonobstructive CAD. RHC showed R>L heart failure with low PAPI suggesting significant RV dysfunction. She was transitioned to PO torsemide and treated with guideline directed medical therapy with Entresto, Spironolactone, carvedilol and digoxin. She was also transitioned off of Xarelto and placed on Coumadin.  Readmitted [id number] with A/C systolic heart failure. She had not been taking her furosemide because she said it was on her AVS to stop it. Diuresed with IV lasix and transitioned to torsemide 40 mg daily. Weight day before discharge was 190 pounds.   Had recent [facility] f/u on 10/17/19. Overall was feeling fine but noted some mild exertional dyspnea with mild-moderate activities. Denied PND/Orthopnea. Weight was stable. Her HF meds were not titrated at that visit as she had not taken her AM meds prior to appt, and provider was not sure if BP would tolerate further increase in meds. Also of note, she was trying to stretch her meds in an effort to extend them longer due to concerns over high cost. She had only been taking Entresto once daily instead of BID and was not taking Jardiance due to cost. There were also issues with poor health literacy. She receives help  from paramedine to assist with meds to help with compliance. She was approved for medication assistance as well. Per paramedicine, Delene Loll was received in the mail and she was not taking it twice daily. Vania Rea was expected to arrive in mail soon.   At last HF clinic visit on 11/04/19, she reported feeling more tired but also attributed this to starting her period. She confirmed that she had been taking all meds placed in her pill box by paramedicine. BP was 120/94 (much better than her usual baseline, per pt report). Unfortunately, she did not take her AM meds prior to the visit. Her weight was down 8 lbs since her last visit in March, after increasing Entresto to BID.   Today she returns to HF clinic for pharmacist medication titration. At last visit with PA, no medication changes were made since her BP was 120/92 prior to AM meds. Unfortunately, she did not take her medication again before today's visit. Symptomatically, she is doing well. She denies dizziness and lightheadedness but does endorse some fatigue. Denies chest pain and palpitations. No SOB at rest, but mentions that she gets SOB after walking ~1 block or if walking up 1 flight of stairs. She is able to complete all ADLs and tries to remain active by walking and playing with her grandkids. She weighs herself at home daily and she states that she normally ranges 180-185 lbs. She takes torsemide 40 mg daily at home and has not needed any extra. No LEE or PND/Orthopnea. She has been working on improving her diet to better control her diabetes. Adheres to a low-salt diet and 2L  daily fluid restriction.   HF Medications: Carvedilol 12.5 mg BID Entresto 49/51 mg BID Spironolactone 25 mg daily Jardiance 25 mg daily Digoxin 0.125 mg daily Torsemide 40mg  daily Potassium 20 mEq three times per week  Has the patient been experiencing any side effects to the medications prescribed?  no  Does the patient have any problems obtaining medications  due to transportation or finances?   Yes - no prescription insurance; Has [employer] patient assistance for [employer] and [employer] patient assistance for [place]. Uses HF fund through [facility] for other heart failure medications.   Understanding of regimen: fair Understanding of indications: fair Potential of compliance: fair Patient understands to avoid NSAIDs. Patient understands to avoid decongestants.   Pertinent Lab Values: . Serum creatinine 1.10, BUN 15, Potassium 3.9, Sodium 133, Magnesium 1.9 (10/03/19), BNP 76.3 pg/mL, Digoxin < 0.2 ng/mL (11/04/19)   Vital Signs: . Weight: 185 lbs (last clinic weight: 180 lbs) . Blood pressure: 134/84 (AM meds not taken); BP with paramedic on 11/22/19 was 140/94 . Heart rate: 91   Assessment: 1. Acute on chronic systolic CHF: Nonischemic cardiomyopathy, diagnosed in 12/2018. Repeat Echo 03/2019 showed EF 20-25%, diffuse hypokinesis, LV thrombus, moderate RV systolic dysfunction, mild MR, moderate TR. She has a history of paroxysmal atrial fibrillation but seems to be mostly in sinus rhythm. This is not likely to be a tachycardia-mediated cardiomyopathy. Poorly controlled diabetes itself can cause a cardiomyopathy, as can poorly controlled HTN. Myocarditis is a potential cause of her cardiomyopathy. LHC 03/2019 showed nonobstructive CAD.RHC showed R>L heart failure with low PAPI suggesting significant RV dysfunction.  - ECHO most recent admit 09/2019 EF < 20%.  - NYHA II-III. Euvolemic on exam - Vitals: BP 134/84, HR 84 - Labs: Scr stable at 1.10, K 3.9, BNP 76.3 pg/mL. - Continue torsemide 40 mg daily - Continue carvedilol 12.5 mg BID - Increase Entresto to 97/103 mg twice a day. Repeat BMET in 4 weeks.   - Continue spironolactone 25 mg daily - Continue Jardiance 25 mg daily.  - Continue digoxin 0.125 mg daily - Continue w/ paramedicine (Zack). Appreciate their assistance.  - She previously had been trying to get pregnant  (has been difficult with polycystic ovarian syndrome). She has 2 grown children already. We have discussed this again today. With her cardiomyopathy and active CHF, pregnancy would create a significant mortality risk for her.A number of her cardiac meds to control her CHF are feto-toxic. She understands this risk and is no longer trying to get pregnant. Knows importance of using contraception to avoid pregnancy.  2. 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  - Compliant with coumadin. INRs followed at [facility] - No bleeding issues  3. Atrial fibrillation: Paroxysmal - HR controlled  - Continue Digoxin - Continue carvedilol 12.5 mg BID -Continue warfarin. INRs followed by PCP - Would consider atrial fibrillation ablation in the future, if recurrence, given CASTLE-HF data.   4. Type 2 diabetes: Poor control, per PCP.   - Hgb A1C 11%. She just started back on insulin recently  - Continue Jardiance 25 mg daily.   5. Hypertension:  - BP 134/84 today - Improved with improved med compliance. Appreciate paramedicine's assistance    Plan: 1) Medication changes: Based on clinical presentation, vital signs and recent labs will Increase Entresto to 97/103 mg BID. Check BMET in 4 weeks. 2) Labs: Scr 1.10, K 3.9 3) Follow-up: 4 weeks with Dr. Ephriam Jenkins, PharmD PGY1 Ambulatory Care Pharmacy Resident  [name], PharmD, BCPS, BCCP, CPP Heart Failure Clinic Pharmacist [phone]

## 2019-11-28 NOTE — Patient Instructions (Addendum)
It was a pleasure seeing you today!  MEDICATIONS: -We are changing your medications today -Increase Entresto to 97/103 mg twice daily - You can use the tablets you currently have until you run out. With your current tablets, you need to take 2 tablets twice daily -Call if you have questions about your medications.  LABS:  -We will call you if your labs need attention.  NEXT APPOINTMENT: Return to clinic in 4 weeks with Dr. Aundra Dubin.  In general, to take care of your heart failure: -Limit your fluid intake to 2 Liters (half-gallon) per day.   -Limit your salt intake to ideally 2-3 grams (2000-3000 mg) per day. -Weigh yourself daily and record, and bring that "weight diary" to your next appointment.  (Weight gain of 2-3 pounds in 1 day typically means fluid weight.) -The medications for your heart are to help your heart and help you live longer.   -Please contact us before stopping any of your heart medications.  Call the clinic at [phone] with questions or to reschedule future appointments.

## 2019-11-28 NOTE — Telephone Encounter (Signed)
CSW had asked pt to come in prior to pharmacy appt to complete paperwork disability had sent her.  CSW and pt completed paperwork and CSW faxed in to disability for review.  Paperwork said it was due on 5/3 but CSW called [employer] and confirmed it was ok being sent in slightly late and they would update in their system.  Pt also expressed continued struggles with depression at this time.  States she does not want to talk to anyone about it but was very open and honest with CSW regarding her feelings.  Reports that she is very irritable and depressed regarding her diagnoses- feels somewhat hopeless having these concerns and not feeling like its worth trying sometimes.  Pt did report stopping to try all together a few weeks ago but has been taking small steps forward and has been compliant since that time with help from [employer].  Pt reports she copes with her stress through distancing herself and listening to music- feels like this helps her and does not feel as if the distancing has a negative affect on her life at this time.  CSW discussed strength focused approach as pt is very negative about a lot of things in her life despite continuing to move forward and exhibit strengths despite her illnesses.  Pt reports that her boyfriend is a positive in her life and wants to keep trying for him.  Has some family support in the form of her 2 daughters one of which lives with her but she is frustrated with the choices her daughter has made and this contributes to her stress.  Pt is interested in medication to assist with her stress and anxiety.  Pt already on a medication at home but cannot remember what it is.  CSW encouraged pt to discuss with her PCP who prescribed that medication to see if she needs to go on a stronger dose or change medications to help manager her symptoms.  CSW will continue to follow and assist as needed  [name], [facility] Desk#: [phone] Cell#: [phone]

## 2019-11-29 ENCOUNTER — Other Ambulatory Visit (HOSPITAL_COMMUNITY): Payer: Self-pay

## 2019-11-29 ENCOUNTER — Other Ambulatory Visit: Payer: Self-pay

## 2019-11-29 NOTE — Progress Notes (Signed)
Paramedicine Encounter    Patient ID: [name], female    DOB: 1975/12/12, 44 y.o.   MRN: [id number]   Patient Care Team: [name], DO as PCP - General (Family Medicine) Larey Dresser, MD as PCP - Cardiology (Cardiology) Constance Haw, MD as PCP - Electrophysiology (Cardiology) [name], LCSW as Social Worker (Licensed Clinical Social Worker)  Patient Active Problem List   Diagnosis Date Noted  . Hypokalemia 10/13/2019  . Hyponatremia 10/13/2019  . Encounter for therapeutic drug monitoring 10/07/2019  . Acute on chronic congestive heart failure ([place])   . Hypertensive urgency 09/30/2019  . Elevated troponin 09/30/2019  . Hypertensive emergency 09/30/2019  . Attempting to conceive 03/10/2019  . Acute on chronic systolic CHF (congestive heart failure) ([place]) 02/19/2019  . Asthma   . Yeast infection 02/06/2019  . Right ventricular failure ([place]) 02/06/2019  . Morbid obesity ([place]) 02/06/2019  . HFrEF (heart failure with reduced ejection fraction) ([place]) 12/31/2018  . Paroxysmal atrial fibrillation ([place]) 12/31/2018  . Anxiety 12/31/2018  . Hyperlipidemia associated with type 2 diabetes mellitus ([place]) 10/30/2017  . Obstructive sleep apnea 06/30/2015  . Anemia due to blood loss, chronic 06/27/2012  . Allergic rhinitis 06/27/2012  . Major depressive disorder 02/08/2012  . Hypertension associated with diabetes ([place]) 12/04/2011  . Dysmenorrhea 11/22/2011  . DM2 (diabetes mellitus, type 2) ([place]) 05/19/2011  . PCOS (polycystic ovarian syndrome) 05/19/2011  . Hydrosalpinx 08/21/2009    Current Outpatient Medications:  .  Accu-Chek Softclix Lancets lancets, Use as instructed, Disp: 100 each, Rfl: 12 .  Accu-Chek Softclix Lancets lancets, Check cbg 2 times daily, Disp: 100 each, Rfl: 12 .  albuterol (VENTOLIN HFA) 108 (90 Base) MCG/ACT inhaler, Inhale 2 puffs into the lungs every 6 (six) hours as needed for wheezing or shortness of breath., Disp: 18 g, Rfl: 1 .   atorvastatin (LIPITOR) 40 MG tablet, Take 1 tablet (40 mg total) by mouth daily at 6 PM., Disp: 30 tablet, Rfl: 3 .  carvedilol (COREG) 12.5 MG tablet, Take 1 tablet (12.5 mg total) by mouth 2 (two) times daily with a meal., Disp: 30 tablet, Rfl: 3 .  digoxin (LANOXIN) 0.125 MG tablet, Take 1 tablet (0.125 mg total) by mouth daily., Disp: 30 tablet, Rfl: 3 .  empagliflozin (JARDIANCE) 25 MG TABS tablet, Take 25 mg by mouth daily., Disp: 90 tablet, Rfl: 3 .  gabapentin (NEURONTIN) 100 MG capsule, Take 1 capsule (100 mg total) by mouth every 8 (eight) hours., Disp: 90 capsule, Rfl: 0 .  insulin glargine (LANTUS) 100 UNIT/ML injection, Inject 0.22 mLs (22 Units total) into the skin daily., Disp: 10 mL, Rfl: 11 .  sacubitril-valsartan (ENTRESTO) 97-103 MG, Take 1 tablet by mouth 2 (two) times daily., Disp: 180 tablet, Rfl: 3 .  spironolactone (ALDACTONE) 25 MG tablet, Take 1 tablet (25 mg total) by mouth daily., Disp: 30 tablet, Rfl: 3 .  torsemide (DEMADEX) 20 MG tablet, Take 2 tablets (40 mg total) by mouth daily., Disp: 60 tablet, Rfl: 3 .  traZODone (DESYREL) 50 MG tablet, TAKE 1 TABLET BY MOUTH AT BEDTIME AS NEEDED FOR SLEEP, Disp: 30 tablet, Rfl: 0 .  warfarin (COUMADIN) 7.5 MG tablet, Take 1 tablet (7.5 mg total) by mouth daily at 6 PM., Disp: 30 tablet, Rfl: 3 .  Blood Glucose Monitoring Suppl (ACCU-CHEK AVIVA PLUS) w/Device KIT, 1 application by Does not apply route QID. Check fasting sugars as well as throughout the day, Disp: 1 kit, Rfl: 0 .  glucose blood (ACCU-CHEK AVIVA PLUS) test strip, Check CBG 2 times per day, Disp: 100 each, Rfl: 12 .  glucose blood (AGAMATRIX PRESTO TEST) test strip, Use as instructed, Disp: 100 each, Rfl: 12 .  Lancets Misc. (ACCU-CHEK SOFTCLIX LANCET DEV) KIT, 1 application by Does not apply route 2 (two) times daily., Disp: 1 kit, Rfl: 0 .  meclizine (ANTIVERT) 12.5 MG tablet, Take 1 tablet (12.5 mg total) by mouth 3 (three) times daily as needed for dizziness.  (Patient not taking: Reported on 11/22/2019), Disp: 30 tablet, Rfl: 0 .  methocarbamol (ROBAXIN) 500 MG tablet, Take 1 tablet (500 mg total) by mouth every 8 (eight) hours as needed for muscle spasms. (Patient not taking: Reported on 11/15/2019), Disp: 30 tablet, Rfl: 0 .  potassium chloride SA (KLOR-CON) 20 MEQ tablet, Take 1 tablet every Monday, Wednesday, and Friday., Disp: 30 tablet, Rfl: 3 .  terconazole (TERAZOL 3) 0.8 % vaginal cream, Place 1 applicator vaginally at bedtime., Disp: 20 g, Rfl: 0 Allergies  Allergen Reactions  . Shellfish Allergy Swelling    Airway involvement including EMS - Has Epi-Pen  . Dulaglutide Nausea And Vomiting and Other (See Comments)    Multiple episodes of vomiting over multiple days  . Metformin And Related Diarrhea      Social History   Socioeconomic History  . Marital status: Single    Spouse name: Not on file  . Number of children: Not on file  . Years of education: Not on file  . Highest education level: Not on file  Occupational History  . Not on file  Tobacco Use  . Smoking status: Never Smoker  . Smokeless tobacco: Never Used  Substance and Sexual Activity  . Alcohol use: No    Alcohol/week: 0.0 standard drinks  . Drug use: No  . Sexual activity: Yes  Other Topics Concern  . Not on file  Social History Narrative  . Not on file   Social Determinants of Health   Financial Resource Strain: High Risk  . Difficulty of Paying Living Expenses: Hard  Food Insecurity:   . Worried About Charity fundraiser in the Last Year:   . Arboriculturist in the Last Year:   Transportation Needs:   . Film/video editor (Medical):   Marland Kitchen Lack of Transportation (Non-Medical):   Physical Activity:   . Days of Exercise per Week:   . Minutes of Exercise per Session:   Stress:   . Feeling of Stress :   Social Connections:   . Frequency of Communication with Friends and Family:   . Frequency of Social Gatherings with Friends and Family:   .  Attends Religious Services:   . Active Member of Clubs or Organizations:   . Attends Archivist Meetings:   Marland Kitchen Marital Status:   Intimate Partner Violence:   . Fear of Current or Ex-Partner:   . Emotionally Abused:   Marland Kitchen Physically Abused:   . Sexually Abused:     Physical Exam      Future Appointments  Date Time Provider [place]  12/11/2019  9:50 AM [name], DO [facility]  12/11/2019 12:45 PM [facility]  12/27/2019 11:20 AM Larey Dresser, MD [facility] None  01/07/2020  8:30 AM Constance Haw, MD [facility]    Ms Delahunty was seen at home today and reported feeling generally well. The purpose of today's visit was to follow amend her pillbox after the changes made  at the clinic yesterday. She was also given a digital scale which is easier for her to read. Her medications were verified and her pillbox was refilled. She advised she got a call from the disability worker stating her paperwork had not come in from her PCP or the coumadin clinic. I contacted the PCP office and they confirmed that they sent her complete medical record to the disability worker. I called the case worker but was only able to leave a message requesting a call back. I will relay this information to Tammy Sours, LCSW, when she returns next week.    [name], EMT 11/29/19  ACTION: Home visit completed Next visit planned for 1 week

## 2019-11-30 MED ORDER — GABAPENTIN 100 MG PO CAPS: 100.0000 mg | ORAL_CAPSULE | Freq: Three times a day (TID) | ORAL | 0 refills | Status: DC

## 2019-12-05 ENCOUNTER — Other Ambulatory Visit (HOSPITAL_COMMUNITY): Payer: Self-pay

## 2019-12-05 ENCOUNTER — Other Ambulatory Visit: Payer: Self-pay

## 2019-12-05 NOTE — Progress Notes (Signed)
Paramedicine Encounter    Patient ID: [name], female    DOB: March 16, 1976, 44 y.o.   MRN: [id number]   Patient Care Team: [name], DO as PCP - General (Family Medicine) Larey Dresser, MD as PCP - Cardiology (Cardiology) Constance Haw, MD as PCP - Electrophysiology (Cardiology) [name], LCSW as Social Worker (Licensed Clinical Social Worker)  Patient Active Problem List   Diagnosis Date Noted  . Hypokalemia 10/13/2019  . Hyponatremia 10/13/2019  . Encounter for therapeutic drug monitoring 10/07/2019  . Acute on chronic congestive heart failure ([place])   . Hypertensive urgency 09/30/2019  . Elevated troponin 09/30/2019  . Hypertensive emergency 09/30/2019  . Attempting to conceive 03/10/2019  . Acute on chronic systolic CHF (congestive heart failure) ([place]) 02/19/2019  . Asthma   . Yeast infection 02/06/2019  . Right ventricular failure ([place]) 02/06/2019  . Morbid obesity ([place]) 02/06/2019  . HFrEF (heart failure with reduced ejection fraction) ([place]) 12/31/2018  . Paroxysmal atrial fibrillation ([place]) 12/31/2018  . Anxiety 12/31/2018  . Hyperlipidemia associated with type 2 diabetes mellitus ([place]) 10/30/2017  . Obstructive sleep apnea 06/30/2015  . Anemia due to blood loss, chronic 06/27/2012  . Allergic rhinitis 06/27/2012  . Major depressive disorder 02/08/2012  . Hypertension associated with diabetes ([place]) 12/04/2011  . Dysmenorrhea 11/22/2011  . DM2 (diabetes mellitus, type 2) ([place]) 05/19/2011  . PCOS (polycystic ovarian syndrome) 05/19/2011  . Hydrosalpinx 08/21/2009    Current Outpatient Medications:  .  Accu-Chek Softclix Lancets lancets, Use as instructed, Disp: 100 each, Rfl: 12 .  Accu-Chek Softclix Lancets lancets, Check cbg 2 times daily, Disp: 100 each, Rfl: 12 .  albuterol (VENTOLIN HFA) 108 (90 Base) MCG/ACT inhaler, Inhale 2 puffs into the lungs every 6 (six) hours as needed for wheezing or shortness of breath., Disp: 18 g, Rfl: 1 .   atorvastatin (LIPITOR) 40 MG tablet, Take 1 tablet (40 mg total) by mouth daily at 6 PM., Disp: 30 tablet, Rfl: 3 .  carvedilol (COREG) 12.5 MG tablet, Take 1 tablet (12.5 mg total) by mouth 2 (two) times daily with a meal., Disp: 30 tablet, Rfl: 3 .  digoxin (LANOXIN) 0.125 MG tablet, Take 1 tablet (0.125 mg total) by mouth daily., Disp: 30 tablet, Rfl: 3 .  empagliflozin (JARDIANCE) 25 MG TABS tablet, Take 25 mg by mouth daily., Disp: 90 tablet, Rfl: 3 .  gabapentin (NEURONTIN) 100 MG capsule, Take 1 capsule (100 mg total) by mouth every 8 (eight) hours., Disp: 90 capsule, Rfl: 0 .  potassium chloride SA (KLOR-CON) 20 MEQ tablet, Take 1 tablet every Monday, Wednesday, and Friday., Disp: 30 tablet, Rfl: 3 .  sacubitril-valsartan (ENTRESTO) 97-103 MG, Take 1 tablet by mouth 2 (two) times daily., Disp: 180 tablet, Rfl: 3 .  spironolactone (ALDACTONE) 25 MG tablet, Take 1 tablet (25 mg total) by mouth daily., Disp: 30 tablet, Rfl: 3 .  torsemide (DEMADEX) 20 MG tablet, Take 2 tablets (40 mg total) by mouth daily., Disp: 60 tablet, Rfl: 3 .  traZODone (DESYREL) 50 MG tablet, TAKE 1 TABLET BY MOUTH AT BEDTIME AS NEEDED FOR SLEEP, Disp: 30 tablet, Rfl: 0 .  warfarin (COUMADIN) 7.5 MG tablet, Take 1 tablet (7.5 mg total) by mouth daily at 6 PM., Disp: 30 tablet, Rfl: 3 .  Blood Glucose Monitoring Suppl (ACCU-CHEK AVIVA PLUS) w/Device KIT, 1 application by Does not apply route QID. Check fasting sugars as well as throughout the day, Disp: 1 kit, Rfl: 0 .  glucose  blood (ACCU-CHEK AVIVA PLUS) test strip, Check CBG 2 times per day, Disp: 100 each, Rfl: 12 .  glucose blood (AGAMATRIX PRESTO TEST) test strip, Use as instructed, Disp: 100 each, Rfl: 12 .  insulin glargine (LANTUS) 100 UNIT/ML injection, Inject 0.22 mLs (22 Units total) into the skin daily. (Patient not taking: Reported on 12/05/2019), Disp: 10 mL, Rfl: 11 .  Lancets Misc. (ACCU-CHEK SOFTCLIX LANCET DEV) KIT, 1 application by Does not apply route 2  (two) times daily., Disp: 1 kit, Rfl: 0 .  meclizine (ANTIVERT) 12.5 MG tablet, Take 1 tablet (12.5 mg total) by mouth 3 (three) times daily as needed for dizziness. (Patient not taking: Reported on 11/22/2019), Disp: 30 tablet, Rfl: 0 .  methocarbamol (ROBAXIN) 500 MG tablet, Take 1 tablet (500 mg total) by mouth every 8 (eight) hours as needed for muscle spasms. (Patient not taking: Reported on 11/15/2019), Disp: 30 tablet, Rfl: 0 .  terconazole (TERAZOL 3) 0.8 % vaginal cream, Place 1 applicator vaginally at bedtime., Disp: 20 g, Rfl: 0 Allergies  Allergen Reactions  . Shellfish Allergy Swelling    Airway involvement including EMS - Has Epi-Pen  . Dulaglutide Nausea And Vomiting and Other (See Comments)    Multiple episodes of vomiting over multiple days  . Metformin And Related Diarrhea      Social History   Socioeconomic History  . Marital status: Single    Spouse name: Not on file  . Number of children: Not on file  . Years of education: Not on file  . Highest education level: Not on file  Occupational History  . Not on file  Tobacco Use  . Smoking status: Never Smoker  . Smokeless tobacco: Never Used  Substance and Sexual Activity  . Alcohol use: No    Alcohol/week: 0.0 standard drinks  . Drug use: No  . Sexual activity: Yes  Other Topics Concern  . Not on file  Social History Narrative  . Not on file   Social Determinants of Health   Financial Resource Strain: High Risk  . Difficulty of Paying Living Expenses: Hard  Food Insecurity:   . Worried About Charity fundraiser in the Last Year:   . Arboriculturist in the Last Year:   Transportation Needs:   . Film/video editor (Medical):   Marland Kitchen Lack of Transportation (Non-Medical):   Physical Activity:   . Days of Exercise per Week:   . Minutes of Exercise per Session:   Stress:   . Feeling of Stress :   Social Connections:   . Frequency of Communication with Friends and Family:   . Frequency of Social  Gatherings with Friends and Family:   . Attends Religious Services:   . Active Member of Clubs or Organizations:   . Attends Archivist Meetings:   Marland Kitchen Marital Status:   Intimate Partner Violence:   . Fear of Current or Ex-Partner:   . Emotionally Abused:   Marland Kitchen Physically Abused:   . Sexually Abused:     Physical Exam Cardiovascular:     Rate and Rhythm: Normal rate and regular rhythm.     Pulses: Normal pulses.  Pulmonary:     Effort: Pulmonary effort is normal.     Breath sounds: Normal breath sounds.  Musculoskeletal:        General: Normal range of motion.     Right lower leg: No edema.     Left lower leg: No edema.  Skin:  General: Skin is warm and dry.     Capillary Refill: Capillary refill takes less than 2 seconds.  Neurological:     Mental Status: She is alert and oriented to person, place, and time.  Psychiatric:        Mood and Affect: Mood normal.         Future Appointments  Date Time Provider [place]  12/11/2019  9:50 AM [name], DO [facility]  12/11/2019 12:45 PM [facility]  12/27/2019 11:20 AM Larey Dresser, MD [facility] None  01/07/2020  8:30 AM Camnitz, Ocie Doyne, MD [facility]    BP (!) 170/120 (BP Location: Left Arm, Patient Position: Sitting, Cuff Size: Normal)   Pulse 90   Resp 16   Wt 190 lb (86.2 kg)   SpO2 98%   BMI 38.38 kg/m   Weight yesterday- 190 lb Last visit weight- 185 lb  Ms Stare was seen at home today and rpeorted feeling generally well with the exception of some depression over the Mother's Day holiday. She stated this depressive episode led to her not taking her medications on Saturday of last week. Additionally she had not taken her medications today. When asked about today's missed medications, she stated that she was not able to come home from work this afternoon to take them because she had a drug screening. The only medication she is  taking in the afternoon is torsemide and potassium however she had missed everything thus far. 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 All other medications were present in her morning slot and should have been taken regardless of if she came home between shifts. As a result she was found to be hypertensive. I explained that allowing herself to become hypertensive due to skipping medications is very dangerous and is causing further harm to her already weak heart. She expressed understanding and advised she would get back on track tonight. She is out of insulin but stated she contacted her PCP and they should be sending in a new prescription to the pharmacy for her to pick up in the coming days. Regarding her disability claim, she received a phone call from the [facility] and has an appointment there before the end of the month. Her medications were verified and her pillbox was refilled. I will follow up next week.   [name], EMT 12/05/19  ACTION: Home visit completed Next visit planned for 1 week

## 2019-12-06 MED ORDER — INSULIN GLARGINE 100 UNIT/ML ~~LOC~~ SOLN: 22.0000 [IU] | SUBCUTANEOUS | 11 refills | Status: DC

## 2019-12-11 ENCOUNTER — Ambulatory Visit (INDEPENDENT_AMBULATORY_CARE_PROVIDER_SITE_OTHER): Payer: Self-pay | Admitting: Family Medicine

## 2019-12-11 ENCOUNTER — Encounter: Payer: Self-pay | Admitting: Family Medicine

## 2019-12-11 ENCOUNTER — Ambulatory Visit (INDEPENDENT_AMBULATORY_CARE_PROVIDER_SITE_OTHER): Payer: Self-pay

## 2019-12-11 ENCOUNTER — Other Ambulatory Visit: Payer: Self-pay

## 2019-12-11 VITALS — BP 148/108 | HR 76 | Ht 59.0 in | Wt 185.8 lb

## 2019-12-11 DIAGNOSIS — I480 Paroxysmal atrial fibrillation: Secondary | ICD-10-CM

## 2019-12-11 DIAGNOSIS — Z5181 Encounter for therapeutic drug level monitoring: Secondary | ICD-10-CM

## 2019-12-11 DIAGNOSIS — B373 Candidiasis of vulva and vagina: Secondary | ICD-10-CM

## 2019-12-11 DIAGNOSIS — B3731 Acute candidiasis of vulva and vagina: Secondary | ICD-10-CM

## 2019-12-11 DIAGNOSIS — F330 Major depressive disorder, recurrent, mild: Secondary | ICD-10-CM

## 2019-12-11 DIAGNOSIS — E1165 Type 2 diabetes mellitus with hyperglycemia: Secondary | ICD-10-CM

## 2019-12-11 DIAGNOSIS — I5020 Unspecified systolic (congestive) heart failure: Secondary | ICD-10-CM

## 2019-12-11 LAB — POCT INR: INR: 2.4 (ref 2.0–3.0)

## 2019-12-11 LAB — POCT GLYCOSYLATED HEMOGLOBIN (HGB A1C): HbA1c, POC (controlled diabetic range): 9.3 % — AB (ref 0.0–7.0)

## 2019-12-11 MED ORDER — FLUCONAZOLE 150 MG PO TABS: 150.0000 mg | ORAL_TABLET | Freq: Once | ORAL | 0 refills | Status: DC

## 2019-12-11 MED ORDER — BUSPIRONE HCL 7.5 MG PO TABS: 7.5000 mg | ORAL_TABLET | Freq: Two times a day (BID) | ORAL | 0 refills | Status: DC

## 2019-12-11 MED ORDER — WARFARIN SODIUM 7.5 MG PO TABS: ORAL_TABLET | ORAL | 2 refills | Status: DC

## 2019-12-11 MED FILL — busPIRone HCL 7.5 MG TABS: 7.5 | 30 days supply | Qty: 60 | Fill #0

## 2019-12-11 MED FILL — FLUCONAZOLE 150 MG TABLET: 150 | 1 days supply | Qty: 1 | Fill #0

## 2019-12-11 MED FILL — LANTUS 100 UNITS/ML VIAL: 100 | 28 days supply | Qty: 10 | Fill #0

## 2019-12-11 MED FILL — GABAPENTIN 100 MG CAPSULE: 100 | 30 days supply | Qty: 90 | Fill #0

## 2019-12-11 NOTE — Patient Instructions (Signed)
Today we talked about starting BuSpar for your depression issues.  I would like you to come back in the next 2 weeks to see me to check in and see how this is working for you.  If you start noticing you are having any problems with this medicine please stop taking it immediately.  It might take 4 to 6 weeks for this medicine to really reach its full effect at this dose at which point we can talk about staying at this dose or increasing if needed.  I am also putting in a medicine for your yeast infection, remember we spoke about the fact that this can interact with your warfarin.  If you notice any bleeding then you should please let us know immediately, I know that you said you have not had bleeding in the past with this and do not want to change your warfarin dose while taking this medicine.  Just make sure that you keep an eye out for bleeding.  Continue to follow with a therapist that your heart doctor has set you up with, please make sure you keep the appointment to talk with them about the ICD implant for your heart.  Dr. Criss Rosales

## 2019-12-11 NOTE — Patient Instructions (Signed)
Description   Continue taking 1.5 tablets daily except for 1 tablet on Sundays and Thursdays. Recheck INR in 4 weeks. Call coumadin clinic for any questions [phone].

## 2019-12-11 NOTE — Progress Notes (Signed)
    SUBJECTIVE:   CHIEF COMPLAINT / HPI:   Vaginal yeast infection Patient experiencing discharge that she says is consistent with prior yeast infections, she denies new sexual contact she denies any dysuria or abdominal pain.  She refuses pelvic exam and would just like a dose of Diflucan  Major depressive disorder Patient with multiple significant medical issues, denies SI/HI.  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  PERTINENT  PMH / PSH:   OBJECTIVE:   BP (!) 148/108   Pulse 76   Ht 4\' 11"  (1.499 m)   Wt 185 lb 12.8 oz (84.3 kg)   SpO2 100%   BMI 37.53 kg/m   General: Alert, pleasant, happier than in past visits Cardiac: Regular rate and rhythm, 3/6 murmur Respiratory: Clear to auscultation bilaterally, no increased work of breathing, no cough, no wheeze   ASSESSMENT/PLAN:   Vaginal yeast infection Patient experiencing discharge that she says is consistent with prior yeast infections, she denies new sexual contact she denies any dysuria or abdominal pain.  She refuses pelvic exam and would just like a dose of Diflucan  Ordered  Major depressive disorder Patient with multiple significant medical issues, denies SI/HI.  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  A1c checked today under 10, patient given note to this effect to take to her employer.  She does not want new medication changes and would like to continue working on diet improvements     [name], [place]

## 2019-12-12 ENCOUNTER — Other Ambulatory Visit (HOSPITAL_COMMUNITY): Payer: Self-pay

## 2019-12-12 MED FILL — WARFARIN SODIUM 7.5 MG TAB: 7.5 | 30 days supply | Qty: 45 | Fill #0

## 2019-12-12 NOTE — Progress Notes (Signed)
Paramedicine Encounter    Patient ID: [name], female    DOB: 1976-02-24, 44 y.o.   MRN: [id number]   Patient Care Team: [name], DO as PCP - General (Family Medicine) Larey Dresser, MD as PCP - Cardiology (Cardiology) Constance Haw, MD as PCP - Electrophysiology (Cardiology) [name], LCSW as Social Worker (Licensed Clinical Social Worker)  Patient Active Problem List   Diagnosis Date Noted  . Hypokalemia 10/13/2019  . Hyponatremia 10/13/2019  . Encounter for therapeutic drug monitoring 10/07/2019  . Acute on chronic congestive heart failure ([place])   . Hypertensive urgency 09/30/2019  . Elevated troponin 09/30/2019  . Hypertensive emergency 09/30/2019  . Attempting to conceive 03/10/2019  . Acute on chronic systolic CHF (congestive heart failure) ([place]) 02/19/2019  . Asthma   . Yeast infection 02/06/2019  . Right ventricular failure ([place]) 02/06/2019  . Morbid obesity ([place]) 02/06/2019  . HFrEF (heart failure with reduced ejection fraction) ([place]) 12/31/2018  . Paroxysmal atrial fibrillation ([place]) 12/31/2018  . Anxiety 12/31/2018  . Hyperlipidemia associated with type 2 diabetes mellitus ([place]) 10/30/2017  . Obstructive sleep apnea 06/30/2015  . Anemia due to blood loss, chronic 06/27/2012  . Allergic rhinitis 06/27/2012  . Major depressive disorder 02/08/2012  . Hypertension associated with diabetes ([place]) 12/04/2011  . Dysmenorrhea 11/22/2011  . DM2 (diabetes mellitus, type 2) ([place]) 05/19/2011  . PCOS (polycystic ovarian syndrome) 05/19/2011  . Hydrosalpinx 08/21/2009    Current Outpatient Medications:  .  albuterol (VENTOLIN HFA) 108 (90 Base) MCG/ACT inhaler, Inhale 2 puffs into the lungs every 6 (six) hours as needed for wheezing or shortness of breath., Disp: 18 g, Rfl: 1 .  atorvastatin (LIPITOR) 40 MG tablet, Take 1 tablet (40 mg total) by mouth daily at 6 PM., Disp: 30 tablet, Rfl: 3 .  busPIRone (BUSPAR) 7.5 MG tablet, Take 1 tablet (7.5 mg  total) by mouth 2 (two) times daily., Disp: 180 tablet, Rfl: 0 .  carvedilol (COREG) 12.5 MG tablet, Take 1 tablet (12.5 mg total) by mouth 2 (two) times daily with a meal., Disp: 30 tablet, Rfl: 3 .  digoxin (LANOXIN) 0.125 MG tablet, Take 1 tablet (0.125 mg total) by mouth daily., Disp: 30 tablet, Rfl: 3 .  empagliflozin (JARDIANCE) 25 MG TABS tablet, Take 25 mg by mouth daily., Disp: 90 tablet, Rfl: 3 .  gabapentin (NEURONTIN) 100 MG capsule, Take 1 capsule (100 mg total) by mouth every 8 (eight) hours., Disp: 90 capsule, Rfl: 0 .  insulin glargine (LANTUS) 100 UNIT/ML injection, Inject 0.22 mLs (22 Units total) into the skin daily., Disp: 10 mL, Rfl: 11 .  potassium chloride SA (KLOR-CON) 20 MEQ tablet, Take 1 tablet every Monday, Wednesday, and Friday., Disp: 30 tablet, Rfl: 3 .  sacubitril-valsartan (ENTRESTO) 97-103 MG, Take 1 tablet by mouth 2 (two) times daily., Disp: 180 tablet, Rfl: 3 .  spironolactone (ALDACTONE) 25 MG tablet, Take 1 tablet (25 mg total) by mouth daily., Disp: 30 tablet, Rfl: 3 .  torsemide (DEMADEX) 20 MG tablet, Take 2 tablets (40 mg total) by mouth daily., Disp: 60 tablet, Rfl: 3 .  traZODone (DESYREL) 50 MG tablet, TAKE 1 TABLET BY MOUTH AT BEDTIME AS NEEDED FOR SLEEP, Disp: 30 tablet, Rfl: 0 .  warfarin (COUMADIN) 7.5 MG tablet, Take as directed by Coumadin Clinic, Disp: 45 tablet, Rfl: 2 .  Accu-Chek Softclix Lancets lancets, Use as instructed, Disp: 100 each, Rfl: 12 .  Accu-Chek Softclix Lancets lancets, Check cbg 2 times daily,  Disp: 100 each, Rfl: 12 .  Blood Glucose Monitoring Suppl (ACCU-CHEK AVIVA PLUS) w/Device KIT, 1 application by Does not apply route QID. Check fasting sugars as well as throughout the day, Disp: 1 kit, Rfl: 0 .  glucose blood (ACCU-CHEK AVIVA PLUS) test strip, Check CBG 2 times per day, Disp: 100 each, Rfl: 12 .  glucose blood (AGAMATRIX PRESTO TEST) test strip, Use as instructed, Disp: 100 each, Rfl: 12 .  Lancets Misc. (ACCU-CHEK  SOFTCLIX LANCET DEV) KIT, 1 application by Does not apply route 2 (two) times daily., Disp: 1 kit, Rfl: 0 .  meclizine (ANTIVERT) 12.5 MG tablet, Take 1 tablet (12.5 mg total) by mouth 3 (three) times daily as needed for dizziness. (Patient not taking: Reported on 12/12/2019), Disp: 30 tablet, Rfl: 0 .  methocarbamol (ROBAXIN) 500 MG tablet, Take 1 tablet (500 mg total) by mouth every 8 (eight) hours as needed for muscle spasms. (Patient not taking: Reported on 11/15/2019), Disp: 30 tablet, Rfl: 0 .  terconazole (TERAZOL 3) 0.8 % vaginal cream, Place 1 applicator vaginally at bedtime., Disp: 20 g, Rfl: 0 Allergies  Allergen Reactions  . Shellfish Allergy Swelling    Airway involvement including EMS - Has Epi-Pen  . Dulaglutide Nausea And Vomiting and Other (See Comments)    Multiple episodes of vomiting over multiple days  . Metformin And Related Diarrhea      Social History   Socioeconomic History  . Marital status: Single    Spouse name: Not on file  . Number of children: Not on file  . Years of education: Not on file  . Highest education level: Not on file  Occupational History  . Not on file  Tobacco Use  . Smoking status: Never Smoker  . Smokeless tobacco: Never Used  Substance and Sexual Activity  . Alcohol use: No    Alcohol/week: 0.0 standard drinks  . Drug use: No  . Sexual activity: Yes  Other Topics Concern  . Not on file  Social History Narrative  . Not on file   Social Determinants of Health   Financial Resource Strain: High Risk  . Difficulty of Paying Living Expenses: Hard  Food Insecurity:   . Worried About Charity fundraiser in the Last Year:   . Arboriculturist in the Last Year:   Transportation Needs:   . Film/video editor (Medical):   Marland Kitchen Lack of Transportation (Non-Medical):   Physical Activity:   . Days of Exercise per Week:   . Minutes of Exercise per Session:   Stress:   . Feeling of Stress :   Social Connections:   . Frequency of  Communication with Friends and Family:   . Frequency of Social Gatherings with Friends and Family:   . Attends Religious Services:   . Active Member of Clubs or Organizations:   . Attends Archivist Meetings:   Marland Kitchen Marital Status:   Intimate Partner Violence:   . Fear of Current or Ex-Partner:   . Emotionally Abused:   Marland Kitchen Physically Abused:   . Sexually Abused:     Physical Exam Cardiovascular:     Rate and Rhythm: Normal rate and regular rhythm.     Pulses: Normal pulses.  Pulmonary:     Effort: Pulmonary effort is normal.     Breath sounds: Normal breath sounds.  Musculoskeletal:        General: Normal range of motion.     Right lower leg: No edema.  Left lower leg: No edema.  Skin:    General: Skin is warm and dry.     Capillary Refill: Capillary refill takes less than 2 seconds.  Neurological:     Mental Status: She is alert and oriented to person, place, and time.  Psychiatric:        Mood and Affect: Mood normal.         Future Appointments  Date Time Provider [place]  12/27/2019 11:20 AM Larey Dresser, MD [facility] None  01/07/2020  8:30 AM Constance Haw, MD [facility]  01/07/2020  9:30 AM [facility]    BP (!) 142/92 (BP Location: Left Arm, Patient Position: Sitting, Cuff Size: Normal)   Pulse 98   Resp 16   Wt 184 lb (83.5 kg)   SpO2 98%   BMI 37.16 kg/m   Weight yesterday- 185 Last visit weight- 185 lb  Ms Tankard was seen at home today and reported feeling well. 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 She stated she has been compliant with her medications over the past week and her weight has been generally stable. Her medications were verified and her pillbox was refilled. I will follow up next week.   [name],  EMT 12/12/19  ACTION: Home visit completed Next visit planned for 1 week

## 2019-12-13 NOTE — Assessment & Plan Note (Signed)
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  A1c checked today under 10, patient given note to this effect to take to her employer.  She does not want new medication changes and would like to continue working on diet improvements

## 2019-12-13 NOTE — Assessment & Plan Note (Signed)
Patient with multiple significant medical issues, denies SI/HI.  Given potential interactions with her medications we will try BuSpar instead of SSRI

## 2019-12-13 NOTE — Assessment & Plan Note (Signed)
Patient experiencing discharge that she says is consistent with prior yeast infections, she denies new sexual contact she denies any dysuria or abdominal pain.  She refuses pelvic exam and would just like a dose of Diflucan  Ordered

## 2019-12-19 ENCOUNTER — Other Ambulatory Visit (HOSPITAL_COMMUNITY): Payer: Self-pay

## 2019-12-19 ENCOUNTER — Other Ambulatory Visit (HOSPITAL_COMMUNITY): Payer: Self-pay | Admitting: *Deleted

## 2019-12-19 ENCOUNTER — Telehealth (HOSPITAL_COMMUNITY): Payer: Self-pay | Admitting: Licensed Clinical Social Worker

## 2019-12-19 MED ORDER — ATORVASTATIN CALCIUM 40 MG PO TABS: 40.0000 mg | ORAL_TABLET | Freq: Every day | ORAL | 3 refills | Status: DC

## 2019-12-19 MED FILL — DIGOXIN 0.125 MG TABLET: 125 | 30 days supply | Qty: 30 | Fill #1

## 2019-12-19 NOTE — Telephone Encounter (Signed)
Community Paramedic brought in additional DSS paperwork for CSW to fax to [place].  Also states that they called pt case worker and she stated that she was missing necessary medical records to move forward from the clinic  CSW called pt case worker, Caryl Comes [phone], and she states that she was actually able to locate the medical records and that she did receive the paperwork we sent in earlier today- reports needed nothing further at this time.  CSW made sure that case worker my number so she can call with any further HF Clinic records needs.  CSW will continue to follow and assist as needed  [name], [facility] Desk#: [phone] Cell#: [phone]

## 2019-12-19 NOTE — Progress Notes (Signed)
Paramedicine Encounter    Patient ID: [name], female    DOB: 1976/03/09, 44 y.o.   MRN: [id number]   Patient Care Team: [name], DO as PCP - General (Family Medicine) Larey Dresser, MD as PCP - Cardiology (Cardiology) Constance Haw, MD as PCP - Electrophysiology (Cardiology) [name], LCSW as Social Worker (Licensed Clinical Social Worker)  Patient Active Problem List   Diagnosis Date Noted  . Hypokalemia 10/13/2019  . Hyponatremia 10/13/2019  . Encounter for therapeutic drug monitoring 10/07/2019  . Acute on chronic congestive heart failure ([place])   . Hypertensive urgency 09/30/2019  . Elevated troponin 09/30/2019  . Hypertensive emergency 09/30/2019  . Attempting to conceive 03/10/2019  . Acute on chronic systolic CHF (congestive heart failure) ([place]) 02/19/2019  . Asthma   . Yeast infection 02/06/2019  . Right ventricular failure ([place]) 02/06/2019  . Morbid obesity ([place]) 02/06/2019  . HFrEF (heart failure with reduced ejection fraction) ([place]) 12/31/2018  . Paroxysmal atrial fibrillation ([place]) 12/31/2018  . Anxiety 12/31/2018  . Hyperlipidemia associated with type 2 diabetes mellitus ([place]) 10/30/2017  . Obstructive sleep apnea 06/30/2015  . Anemia due to blood loss, chronic 06/27/2012  . Allergic rhinitis 06/27/2012  . Major depressive disorder 02/08/2012  . Hypertension associated with diabetes ([place]) 12/04/2011  . Dysmenorrhea 11/22/2011  . Vaginal yeast infection 05/19/2011  . DM2 (diabetes mellitus, type 2) ([place]) 05/19/2011  . PCOS (polycystic ovarian syndrome) 05/19/2011  . Hydrosalpinx 08/21/2009    Current Outpatient Medications:  .  Accu-Chek Softclix Lancets lancets, Use as instructed, Disp: 100 each, Rfl: 12 .  Accu-Chek Softclix Lancets lancets, Check cbg 2 times daily, Disp: 100 each, Rfl: 12 .  albuterol (VENTOLIN HFA) 108 (90 Base) MCG/ACT inhaler, Inhale 2 puffs into the lungs every 6 (six) hours as needed for wheezing or shortness  of breath., Disp: 18 g, Rfl: 1 .  Blood Glucose Monitoring Suppl (ACCU-CHEK AVIVA PLUS) w/Device KIT, 1 application by Does not apply route QID. Check fasting sugars as well as throughout the day, Disp: 1 kit, Rfl: 0 .  busPIRone (BUSPAR) 7.5 MG tablet, Take 1 tablet (7.5 mg total) by mouth 2 (two) times daily., Disp: 180 tablet, Rfl: 0 .  carvedilol (COREG) 12.5 MG tablet, Take 1 tablet (12.5 mg total) by mouth 2 (two) times daily with a meal., Disp: 30 tablet, Rfl: 3 .  digoxin (LANOXIN) 0.125 MG tablet, Take 1 tablet (0.125 mg total) by mouth daily., Disp: 30 tablet, Rfl: 3 .  empagliflozin (JARDIANCE) 25 MG TABS tablet, Take 25 mg by mouth daily., Disp: 90 tablet, Rfl: 3 .  gabapentin (NEURONTIN) 100 MG capsule, Take 1 capsule (100 mg total) by mouth every 8 (eight) hours., Disp: 90 capsule, Rfl: 0 .  insulin glargine (LANTUS) 100 UNIT/ML injection, Inject 0.22 mLs (22 Units total) into the skin daily., Disp: 10 mL, Rfl: 11 .  potassium chloride SA (KLOR-CON) 20 MEQ tablet, Take 1 tablet every Monday, Wednesday, and Friday., Disp: 30 tablet, Rfl: 3 .  sacubitril-valsartan (ENTRESTO) 97-103 MG, Take 1 tablet by mouth 2 (two) times daily., Disp: 180 tablet, Rfl: 3 .  spironolactone (ALDACTONE) 25 MG tablet, Take 1 tablet (25 mg total) by mouth daily., Disp: 30 tablet, Rfl: 3 .  torsemide (DEMADEX) 20 MG tablet, Take 2 tablets (40 mg total) by mouth daily., Disp: 60 tablet, Rfl: 3 .  traZODone (DESYREL) 50 MG tablet, TAKE 1 TABLET BY MOUTH AT BEDTIME AS NEEDED FOR SLEEP, Disp: 30 tablet,  Rfl: 0 .  warfarin (COUMADIN) 7.5 MG tablet, Take as directed by [facility] Clinic, Disp: 45 tablet, Rfl: 2 .  atorvastatin (LIPITOR) 40 MG tablet, Take 1 tablet (40 mg total) by mouth daily at 6 PM., Disp: 30 tablet, Rfl: 3 .  glucose blood (ACCU-CHEK AVIVA PLUS) test strip, Check CBG 2 times per day, Disp: 100 each, Rfl: 12 .  glucose blood (AGAMATRIX PRESTO TEST) test strip, Use as instructed, Disp: 100 each, Rfl:  12 .  Lancets Misc. (ACCU-CHEK SOFTCLIX LANCET DEV) KIT, 1 application by Does not apply route 2 (two) times daily., Disp: 1 kit, Rfl: 0 .  meclizine (ANTIVERT) 12.5 MG tablet, Take 1 tablet (12.5 mg total) by mouth 3 (three) times daily as needed for dizziness. (Patient not taking: Reported on 12/12/2019), Disp: 30 tablet, Rfl: 0 .  methocarbamol (ROBAXIN) 500 MG tablet, Take 1 tablet (500 mg total) by mouth every 8 (eight) hours as needed for muscle spasms. (Patient not taking: Reported on 11/15/2019), Disp: 30 tablet, Rfl: 0 .  terconazole (TERAZOL 3) 0.8 % vaginal cream, Place 1 applicator vaginally at bedtime., Disp: 20 g, Rfl: 0 Allergies  Allergen Reactions  . Shellfish Allergy Swelling    Airway involvement including EMS - Has Epi-Pen  . Dulaglutide Nausea And Vomiting and Other (See Comments)    Multiple episodes of vomiting over multiple days  . Metformin And Related Diarrhea      Social History   Socioeconomic History  . Marital status: Single    Spouse name: Not on file  . Number of children: Not on file  . Years of education: Not on file  . Highest education level: Not on file  Occupational History  . Not on file  Tobacco Use  . Smoking status: Never Smoker  . Smokeless tobacco: Never Used  Substance and Sexual Activity  . Alcohol use: No    Alcohol/week: 0.0 standard drinks  . Drug use: No  . Sexual activity: Yes  Other Topics Concern  . Not on file  Social History Narrative  . Not on file   Social Determinants of Health   Financial Resource Strain: High Risk  . Difficulty of Paying Living Expenses: Hard  Food Insecurity:   . Worried About Charity fundraiser in the Last Year:   . Arboriculturist in the Last Year:   Transportation Needs:   . Film/video editor (Medical):   Marland Kitchen Lack of Transportation (Non-Medical):   Physical Activity:   . Days of Exercise per Week:   . Minutes of Exercise per Session:   Stress:   . Feeling of Stress :   Social  Connections:   . Frequency of Communication with Friends and Family:   . Frequency of Social Gatherings with Friends and Family:   . Attends Religious Services:   . Active Member of Clubs or Organizations:   . Attends Archivist Meetings:   Marland Kitchen Marital Status:   Intimate Partner Violence:   . Fear of Current or Ex-Partner:   . Emotionally Abused:   Marland Kitchen Physically Abused:   . Sexually Abused:     Physical Exam Cardiovascular:     Rate and Rhythm: Normal rate and regular rhythm.     Pulses: Normal pulses.  Pulmonary:     Effort: Pulmonary effort is normal.     Breath sounds: Normal breath sounds.  Musculoskeletal:        General: Normal range of motion.     Right  lower leg: No edema.     Left lower leg: No edema.  Skin:    General: Skin is warm and dry.     Capillary Refill: Capillary refill takes less than 2 seconds.  Neurological:     Mental Status: She is alert and oriented to person, place, and time.  Psychiatric:        Mood and Affect: Mood normal.         Future Appointments  Date Time Provider [place]  12/27/2019 11:20 AM Larey Dresser, MD [facility] None  01/07/2020  8:30 AM Constance Haw, MD [facility]  01/07/2020  9:30 AM [facility]    BP (!) 142/100 (BP Location: Left Arm, Patient Position: Sitting, Cuff Size: Normal)   Pulse 85   Resp 16   Wt 187 lb (84.8 kg)   SpO2 98%   BMI 37.77 kg/m   Weight yesterday- 186 lb Last visit weight- 184 lb  Ms Mozingo was seen at home today and reported feeling generally well. She denied chest pain, SOB, dizziness, orthopnea, fever or cough since our last visit. She stated she has been having intermittent headaches but nothing out of the ordinary. She was noted to be hypertensive but said she had taken her medications just a few minutes prior to my arrival. Her medications were verified and her pillbox was refilled I will follow up next week.    [name], EMT 12/19/19  ACTION: Home visit completed Next visit planned for 1 week

## 2019-12-20 ENCOUNTER — Emergency Department (HOSPITAL_COMMUNITY): Payer: No Typology Code available for payment source

## 2019-12-20 ENCOUNTER — Other Ambulatory Visit: Payer: Self-pay

## 2019-12-20 ENCOUNTER — Telehealth (HOSPITAL_COMMUNITY): Payer: Self-pay | Admitting: Licensed Clinical Social Worker

## 2019-12-20 ENCOUNTER — Emergency Department (HOSPITAL_COMMUNITY)
Admission: EM | Admit: 2019-12-20 | Discharge: 2019-12-20 | Disposition: A | Discharge status: Home / Self Care | Payer: No Typology Code available for payment source | Attending: Emergency Medicine | Admitting: Emergency Medicine

## 2019-12-20 DIAGNOSIS — Z7901 Long term (current) use of anticoagulants: Secondary | ICD-10-CM | POA: Insufficient documentation

## 2019-12-20 DIAGNOSIS — Z79899 Other long term (current) drug therapy: Secondary | ICD-10-CM | POA: Diagnosis not present

## 2019-12-20 DIAGNOSIS — Y92410 Unspecified street and highway as the place of occurrence of the external cause: Secondary | ICD-10-CM | POA: Diagnosis not present

## 2019-12-20 DIAGNOSIS — I110 Hypertensive heart disease with heart failure: Secondary | ICD-10-CM | POA: Insufficient documentation

## 2019-12-20 DIAGNOSIS — J45909 Unspecified asthma, uncomplicated: Secondary | ICD-10-CM | POA: Insufficient documentation

## 2019-12-20 DIAGNOSIS — E119 Type 2 diabetes mellitus without complications: Secondary | ICD-10-CM | POA: Diagnosis not present

## 2019-12-20 DIAGNOSIS — M25511 Pain in right shoulder: Secondary | ICD-10-CM | POA: Diagnosis not present

## 2019-12-20 DIAGNOSIS — I5042 Chronic combined systolic (congestive) and diastolic (congestive) heart failure: Secondary | ICD-10-CM | POA: Diagnosis not present

## 2019-12-20 DIAGNOSIS — Y9389 Activity, other specified: Secondary | ICD-10-CM | POA: Insufficient documentation

## 2019-12-20 DIAGNOSIS — R519 Headache, unspecified: Secondary | ICD-10-CM | POA: Diagnosis not present

## 2019-12-20 DIAGNOSIS — Y999 Unspecified external cause status: Secondary | ICD-10-CM | POA: Diagnosis not present

## 2019-12-20 DIAGNOSIS — Z794 Long term (current) use of insulin: Secondary | ICD-10-CM | POA: Insufficient documentation

## 2019-12-20 DIAGNOSIS — S161XXA Strain of muscle, fascia and tendon at neck level, initial encounter: Secondary | ICD-10-CM | POA: Diagnosis not present

## 2019-12-20 DIAGNOSIS — S199XXA Unspecified injury of neck, initial encounter: Secondary | ICD-10-CM | POA: Diagnosis present

## 2019-12-20 DIAGNOSIS — M542 Cervicalgia: Secondary | ICD-10-CM | POA: Diagnosis not present

## 2019-12-20 MED ORDER — ACETAMINOPHEN 500 MG PO TABS
1000.0000 mg | ORAL_TABLET | Freq: Once | ORAL | Status: AC
  Administered 2019-12-20: 1000 mg via ORAL
  Filled 2019-12-20: qty 2

## 2019-12-20 MED ORDER — IBUPROFEN 400 MG PO TABS
600.0000 mg | ORAL_TABLET | Freq: Once | ORAL | Status: DC
  Filled 2019-12-20: qty 1

## 2019-12-20 MED FILL — ?ATORVASTATIN 40MG TABLET: 40 | 30 days supply | Qty: 30 | Fill #0

## 2019-12-20 NOTE — ED Notes (Signed)
Patient transported to CT 
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
## 2019-12-20 NOTE — Telephone Encounter (Signed)
CSW received call from pt DSS worker, [place], requesting HF clinic notes and tests from the past year.  CSW faxed requested documentation for review.  CSW will continue to follow and assist as needed  [name], [facility] Desk#: [phone] Cell#: [phone]
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
## 2019-12-26 ENCOUNTER — Other Ambulatory Visit (HOSPITAL_COMMUNITY): Payer: Self-pay

## 2019-12-26 MED FILL — ATORVASTATIN 40 MG TABLET: 40 | 30 days supply | Qty: 30 | Fill #1
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
## 2019-12-27 ENCOUNTER — Ambulatory Visit (HOSPITAL_COMMUNITY)
Admission: RE | Admit: 2019-12-27 | Discharge: 2019-12-27 | Disposition: A | Discharge status: Home / Self Care | Payer: Medicaid Other | Source: Ambulatory Visit | Attending: Cardiology | Admitting: Cardiology

## 2019-12-27 ENCOUNTER — Encounter (HOSPITAL_COMMUNITY): Payer: Self-pay | Admitting: Cardiology

## 2019-12-27 ENCOUNTER — Other Ambulatory Visit (HOSPITAL_COMMUNITY): Payer: Self-pay

## 2019-12-27 ENCOUNTER — Other Ambulatory Visit: Payer: Self-pay

## 2019-12-27 VITALS — BP 140/96 | HR 82 | Wt 191.6 lb

## 2019-12-27 DIAGNOSIS — E282 Polycystic ovarian syndrome: Secondary | ICD-10-CM | POA: Insufficient documentation

## 2019-12-27 DIAGNOSIS — Z794 Long term (current) use of insulin: Secondary | ICD-10-CM | POA: Diagnosis not present

## 2019-12-27 DIAGNOSIS — I480 Paroxysmal atrial fibrillation: Secondary | ICD-10-CM | POA: Diagnosis not present

## 2019-12-27 DIAGNOSIS — G4733 Obstructive sleep apnea (adult) (pediatric): Secondary | ICD-10-CM | POA: Insufficient documentation

## 2019-12-27 DIAGNOSIS — Z886 Allergy status to analgesic agent status: Secondary | ICD-10-CM | POA: Diagnosis not present

## 2019-12-27 DIAGNOSIS — E669 Obesity, unspecified: Secondary | ICD-10-CM | POA: Insufficient documentation

## 2019-12-27 DIAGNOSIS — I2510 Atherosclerotic heart disease of native coronary artery without angina pectoris: Secondary | ICD-10-CM | POA: Diagnosis not present

## 2019-12-27 DIAGNOSIS — I5023 Acute on chronic systolic (congestive) heart failure: Secondary | ICD-10-CM | POA: Diagnosis not present

## 2019-12-27 DIAGNOSIS — Z833 Family history of diabetes mellitus: Secondary | ICD-10-CM | POA: Insufficient documentation

## 2019-12-27 DIAGNOSIS — I110 Hypertensive heart disease with heart failure: Secondary | ICD-10-CM | POA: Insufficient documentation

## 2019-12-27 DIAGNOSIS — I5020 Unspecified systolic (congestive) heart failure: Secondary | ICD-10-CM

## 2019-12-27 DIAGNOSIS — I428 Other cardiomyopathies: Secondary | ICD-10-CM | POA: Diagnosis not present

## 2019-12-27 DIAGNOSIS — Z8249 Family history of ischemic heart disease and other diseases of the circulatory system: Secondary | ICD-10-CM | POA: Insufficient documentation

## 2019-12-27 DIAGNOSIS — E118 Type 2 diabetes mellitus with unspecified complications: Secondary | ICD-10-CM | POA: Insufficient documentation

## 2019-12-27 DIAGNOSIS — Z7901 Long term (current) use of anticoagulants: Secondary | ICD-10-CM | POA: Insufficient documentation

## 2019-12-27 DIAGNOSIS — Z86718 Personal history of other venous thrombosis and embolism: Secondary | ICD-10-CM | POA: Diagnosis not present

## 2019-12-27 DIAGNOSIS — E785 Hyperlipidemia, unspecified: Secondary | ICD-10-CM | POA: Insufficient documentation

## 2019-12-27 DIAGNOSIS — Z79899 Other long term (current) drug therapy: Secondary | ICD-10-CM | POA: Insufficient documentation

## 2019-12-27 DIAGNOSIS — Z888 Allergy status to other drugs, medicaments and biological substances status: Secondary | ICD-10-CM | POA: Insufficient documentation

## 2019-12-27 LAB — DIGOXIN LEVEL: Digoxin Level: 0.4 ng/mL — ABNORMAL LOW (ref 0.8–2.0)

## 2019-12-27 LAB — BASIC METABOLIC PANEL
Anion gap: 9 (ref 5–15)
BUN: 8 mg/dL (ref 6–20)
CO2: 23 mmol/L (ref 22–32)
Calcium: 8.8 mg/dL — ABNORMAL LOW (ref 8.9–10.3)
Chloride: 104 mmol/L (ref 98–111)
Creatinine, Ser: 0.77 mg/dL (ref 0.44–1.00)
GFR calc Af Amer: >60 mL/min (ref >60)
GFR calc non Af Amer: >60 mL/min (ref >60)
Glucose, Bld: 135 mg/dL — ABNORMAL HIGH (ref 70–99)
Potassium: 4.1 mmol/L (ref 3.5–5.1)
Sodium: 136 mmol/L (ref 135–145)

## 2019-12-27 MED ORDER — CARVEDILOL 12.5 MG PO TABS: 18.7500 mg | ORAL_TABLET | Freq: Two times a day (BID) | ORAL | 3 refills | Status: DC

## 2019-12-27 MED FILL — ?CARVEDILOL 12.5 MG TABLET: 12.5 | 30 days supply | Qty: 90 | Fill #0

## 2019-12-27 NOTE — Progress Notes (Signed)
Paramedicine Encounter   Patient ID: [name] , female,   DOB: Jul 17, 1976,44 y.o.,  MRN: [id number]   [name] was seen at the HF clinic today for he appointment with Dr Aundra Dubin. He advised she should increase carvedilol by 6.25 BID for a total of 18.75 BID and he is scheduling her for an echo next week. [name] advised she would be able to make the change to her pillbox and I will follow up next week.   [name], EMT 12/27/2019   ACTION: Home visit completed Next visit planned for 1 week

## 2019-12-27 NOTE — Patient Instructions (Addendum)
Increase Carvedilol to 18.75 mg (1 & 1/2 tabs) Twice daily   Labs done today, we will call you for abnormal results  Your physician has requested that you have an echocardiogram. Echocardiography is a painless test that uses sound waves to create images of your heart. It provides your doctor with information about the size and shape of your heart and how well your heart's chambers and valves are working. This procedure takes approximately one hour. There are no restrictions for this procedure.  Please follow up with our heart failure pharmacist in 2-3 weeks  Your physician recommends that you schedule a follow-up appointment in: 3 months  If you have any questions or concerns before your next appointment please send [place] a message through [place] or call our office at [phone].    TO LEAVE A MESSAGE FOR THE NURSE SELECT OPTION 2, PLEASE LEAVE A MESSAGE INCLUDING: . YOUR NAME . DATE OF BIRTH . CALL BACK NUMBER . REASON FOR CALL**this is important as we prioritize the call backs  [place] AS LONG AS YOU CALL BEFORE 4:00 PM  At the [facility], you and your health needs are our priority. As part of our continuing mission to provide you with exceptional heart care, we have created designated Provider Care Teams. These Care Teams include your primary Cardiologist (physician) and Advanced Practice Providers (APPs- Physician Assistants and Nurse Practitioners) who all work together to provide you with the care you need, when you need it.   You may see any of the following providers on your designated Care Team at your next follow up: Marland Kitchen Dr Glori Bickers . Dr Loralie Champagne . Darrick Grinder, NP . Lyda Jester, PA . [name], PharmD   Please be sure to bring in all your medications bottles to every appointment.

## 2019-12-29 NOTE — Progress Notes (Signed)
Advanced Heart Failure Clinic Note   Referring Physician: PCP: [name], DO PCP-Cardiologist: Dr. Aundra Dubin  EP- Dr. Curt Bears   HPI: 44 y.o. with history of type 2 diabetes (poorly controlled, last hgbA1c > 15), poorly controlled HTN, hyperlipidemia, paroxysmal atrial fibrillation, and chronic systolic CHF of uncertain etiology.  Patient had an echo in 6/20 as outpatient due to exertional dyspnea. This showed EF 20-25%, moderate RV dysfunction.  She was also noted to have paroxysmal atrial fibrillation.  She was started on Xarelto.  In 7/20, she was admitted with DKA. In 8/20, she was admitted with CHF exacerbation and diuresed.  She was sent home on Lasix 40 mg po bid.   Readmitted on 9/20 with CHF exacerbation, in the setting of elevated BP.  Diuresed with IV lasix.  LHC showed nonobstructive CAD.  RHC showed R>L heart failure with low PAPI suggesting significant RV dysfunction. She was transitioned to PO torsemide and treated w/ guidelines directed medical therapy w/ Entresto, Spironolactone, Coreg and digoxin. She was also transitioned off of Xarelto and placed on Coumadin.  Readmitted 01/28/7340 with A/C systolic heart failure. She had not been taking her lasix because she says it was on her AVS to stop it.  Diuresed with IV lasix and transitioned to torsemide 40 mg daily. Weight day before discharge was 190 pounds. Echo with LV EF <20%, RV severely decreased systolic function.   She returns for followup of CHF.  BP is high today, she has not taken her morning meds.  She is followed by paramedicine and generally has been taking her meds recently.  She is short of breath after 1 block or lifting a heavy object.  No orthopnea/PND.  No palpitations.  No chest pain. Weight is up 11 lbs, has been eating more and not exercising.    ECG (personally reviewed): NSR with LVH  Labs (5/21): hgbA1c 9.3, BNP 76, K 3.9, creatinine 1.1  PMH: 1. Type 2 diabetes: H/o DKA 2. HTN 3. OSA 4. Obesity 5.  Polycystic ovarian syndrome 6. Chronic systolic CHF: Nonischemic cardiomyopathy - Echo (6/20) with EF 20-25%, moderate RV dysfunction.  - LHC/RHC (9/20): No significant CAD.  - Echo (3/21): EF <20%, moderate LVH with global HK, severely reduced RV systolic function.  7. Hyperlipidemia 8. Atrial fibrillation: Paroxysmal.  9. History of LV thrombus.    Current Outpatient Medications  Medication Sig Dispense Refill  . Accu-Chek Softclix Lancets lancets Use as instructed 100 each 12  . Accu-Chek Softclix Lancets lancets Check cbg 2 times daily 100 each 12  . albuterol (VENTOLIN HFA) 108 (90 Base) MCG/ACT inhaler Inhale 2 puffs into the lungs every 6 (six) hours as needed for wheezing or shortness of breath. 18 g 1  . atorvastatin (LIPITOR) 40 MG tablet Take 1 tablet (40 mg total) by mouth daily at 6 PM. 30 tablet 3  . Blood Glucose Monitoring Suppl (ACCU-CHEK AVIVA PLUS) w/Device KIT 1 application by Does not apply route QID. Check fasting sugars as well as throughout the day 1 kit 0  . busPIRone (BUSPAR) 7.5 MG tablet Take 1 tablet (7.5 mg total) by mouth 2 (two) times daily. 180 tablet 0  . carvedilol (COREG) 12.5 MG tablet Take 1.5 tablets (18.75 mg total) by mouth 2 (two) times daily with a meal. 90 tablet 3  . digoxin (LANOXIN) 0.125 MG tablet Take 1 tablet (0.125 mg total) by mouth daily. 30 tablet 3  . empagliflozin (JARDIANCE) 25 MG TABS tablet Take 25 mg by mouth daily. [place]  tablet 3  . gabapentin (NEURONTIN) 100 MG capsule Take 1 capsule (100 mg total) by mouth every 8 (eight) hours. 90 capsule 0  . glucose blood (ACCU-CHEK AVIVA PLUS) test strip Check CBG 2 times per day 100 each 12  . glucose blood (AGAMATRIX PRESTO TEST) test strip Use as instructed 100 each 12  . insulin glargine (LANTUS) 100 UNIT/ML injection Inject 0.22 mLs (22 Units total) into the skin daily. 10 mL 11  . Lancets Misc. (ACCU-CHEK SOFTCLIX LANCET DEV) KIT 1 application by Does not apply route 2 (two) times daily. 1  kit 0  . meclizine (ANTIVERT) 12.5 MG tablet Take 1 tablet (12.5 mg total) by mouth 3 (three) times daily as needed for dizziness. 30 tablet 0  . methocarbamol (ROBAXIN) 500 MG tablet Take 1 tablet (500 mg total) by mouth every 8 (eight) hours as needed for muscle spasms. 30 tablet 0  . potassium chloride SA (KLOR-CON) 20 MEQ tablet Take 1 tablet by mouth every Monday Wednesday and Friday    . sacubitril-valsartan (ENTRESTO) 97-103 MG Take 1 tablet by mouth 2 (two) times daily. 180 tablet 3  . spironolactone (ALDACTONE) 25 MG tablet Take 1 tablet (25 mg total) by mouth daily. 30 tablet 3  . terconazole (TERAZOL 3) 0.8 % vaginal cream Place 1 applicator vaginally at bedtime. 20 g 0  . torsemide (DEMADEX) 20 MG tablet Take 2 tablets (40 mg total) by mouth daily. 60 tablet 3  . traZODone (DESYREL) 50 MG tablet Take 50 mg by mouth at bedtime as needed for sleep.    Marland Kitchen warfarin (COUMADIN) 7.5 MG tablet Take as directed by Coumadin Clinic (Patient taking differently: Take 7.5 mg by mouth See admin instructions. 7.5 mg (7.5 mg x 1) every Sun, Thu; 11.25 mg (7.5 mg x 1.5) all other days) 45 tablet 2   No current facility-administered medications for this encounter.    Allergies  Allergen Reactions  . Shellfish Allergy Swelling    Airway involvement including EMS - Has Epi-Pen  . Dulaglutide Nausea And Vomiting and Other (See Comments)    Multiple episodes of vomiting over multiple days  . Metformin And Related Diarrhea  . Ibuprofen     Per PCP interferes with daily meds       Social History   Socioeconomic History  . Marital status: Single    Spouse name: Not on file  . Number of children: Not on file  . Years of education: Not on file  . Highest education level: Not on file  Occupational History  . Not on file  Tobacco Use  . Smoking status: Never Smoker  . Smokeless tobacco: Never Used  Substance and Sexual Activity  . Alcohol use: No    Alcohol/week: 0.0 standard drinks  . Drug  use: No  . Sexual activity: Yes  Other Topics Concern  . Not on file  Social History Narrative  . Not on file   Social Determinants of Health   Financial Resource Strain: High Risk  . Difficulty of Paying Living Expenses: Hard  Food Insecurity:   . Worried About Charity fundraiser in the Last Year:   . Arboriculturist in the Last Year:   Transportation Needs:   . Film/video editor (Medical):   Marland Kitchen Lack of Transportation (Non-Medical):   Physical Activity:   . Days of Exercise per Week:   . Minutes of Exercise per Session:   Stress:   . Feeling of Stress :  Social Connections:   . Frequency of Communication with Friends and Family:   . Frequency of Social Gatherings with Friends and Family:   . Attends Religious Services:   . Active Member of Clubs or Organizations:   . Attends Archivist Meetings:   Marland Kitchen Marital Status:   Intimate Partner Violence:   . Fear of Current or Ex-Partner:   . Emotionally Abused:   Marland Kitchen Physically Abused:   . Sexually Abused:       Family History  Problem Relation Age of Onset  . Diabetes Mother   . Hypertension Mother   . Hypertension Father   . Diabetes Father     Vitals:   12/27/19 1141  BP: (!) 140/96  Pulse: 82  SpO2: 98%  Weight: 86.9 kg (191 lb 9.6 oz)   General: NAD, obese.  Neck: Thick, no JVD, no thyromegaly or thyroid nodule.  Lungs: Clear to auscultation bilaterally with normal respiratory effort. CV: Nondisplaced PMI.  Heart regular S1/S2, no S3/S4, no murmur.  No peripheral edema.  No carotid bruit.  Normal pedal pulses.  Abdomen: Soft, nontender, no hepatosplenomegaly, no distention.  Skin: Intact without lesions or rashes.  Neurologic: Alert and oriented x 3.  Psych: Normal affect. Extremities: No clubbing or cyanosis.  HEENT: Normal.   ASSESSMENT & PLAN:  1. Acute on chronic systolic CHF:  Nonischemic cardiomyopathy, diagnosed in 6/20. Repeat Echo 03/2019 showed EF 20-25%, diffuse hypokinesis, LV  thrombus, moderate RV systolic dysfunction, mild MR, moderate TR. Echo in 3/21 with EF <30%, severe RV dysfunction. She has a history of paroxysmal atrial fibrillation but seems to be mostly in sinus rhythm. This is not likely to be a tachycardia-mediated cardiomyopathy. Poorly controlled diabetes itself can cause a cardiomyopathy, as can poorly controlled HTN. Myocarditis is a potential cause of her cardiomyopathy. [place] 9/20 showed nonobstructive CAD. RHC showed R>L heart failure with low PAPI suggesting significant RV dysfunction.  NYHA class II symptoms.  She is now taking all her medications with the help of paramedicine.  She is not volume overloaded.  - Continue Entresto 97/103 bid.  - Continue torsemide 40 mg daily.   BMET today.  - Continue spironolactone 25 mg daily.  - Increase Coreg to 18.75 mg bid.  - Continue digoxin. Check dig level today  - Continue empagliflozin.  - Continue w/ paramedicine. Appreciate their assistance  - Now that she has been generally compliant with her meds, I will repeat a limited echo for EF next week.  The week following that, she has an appointment with EP.  She will need ICD if EF remains < 35%.  She is not a CRT candidate.  - She previously had been trying to get pregnant (has been difficult with polycystic ovarian syndrome). She has 2 grown children already.With her cardiomyopathy and active CHF, pregnancy would create a significant mortality risk for her. A number of her cardiac meds to control her CHF are feto-toxic. She understands this risk and is no longer trying to get pregnant. Knows importance of using contraception to avoid pregnancy.  2. 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  - Compliant w/ coumadin. INRs followed at [facility].  3. Atrial fibrillation: Paroxysmal. NSR today.  - Continue warfarin (using warfarin rather than DOAC with h/o LV thrombus).  INRs followed by PCP - Would consider atrial fibrillation ablation in the future, if  recurrence, given [facility]-HF data.  4. Type 2 diabetes: Poor control, per PCP.   5. Hypertension:  - Increasing  Coreg today.  6. LV thrombus: She is on warfarin.   Followup in 3-4 weeks with pharmacist for uptration of Coreg.  See me in 3 months.   Loralie Champagne, MD 12/29/19

## 2019-12-31 ENCOUNTER — Other Ambulatory Visit: Payer: Self-pay

## 2019-12-31 ENCOUNTER — Ambulatory Visit (HOSPITAL_COMMUNITY)
Admission: RE | Admit: 2019-12-31 | Discharge: 2019-12-31 | Disposition: A | Discharge status: Home / Self Care | Payer: Medicaid Other | Source: Ambulatory Visit | Attending: Cardiology | Admitting: Cardiology

## 2019-12-31 DIAGNOSIS — I110 Hypertensive heart disease with heart failure: Secondary | ICD-10-CM | POA: Diagnosis not present

## 2019-12-31 DIAGNOSIS — E119 Type 2 diabetes mellitus without complications: Secondary | ICD-10-CM | POA: Diagnosis not present

## 2019-12-31 DIAGNOSIS — E785 Hyperlipidemia, unspecified: Secondary | ICD-10-CM | POA: Diagnosis not present

## 2019-12-31 DIAGNOSIS — I5020 Unspecified systolic (congestive) heart failure: Secondary | ICD-10-CM | POA: Diagnosis not present

## 2019-12-31 NOTE — Progress Notes (Signed)
°  Echocardiogram 2D Echocardiogram has been performed.  [name] 12/31/2019, 1:39 PM

## 2020-01-02 ENCOUNTER — Other Ambulatory Visit (HOSPITAL_COMMUNITY): Payer: Self-pay

## 2020-01-02 ENCOUNTER — Telehealth (HOSPITAL_COMMUNITY): Payer: Self-pay

## 2020-01-02 NOTE — Telephone Encounter (Signed)
Please annotate on patients most recent echo.

## 2020-01-02 NOTE — Progress Notes (Signed)
Paramedicine Encounter    Patient ID: [name], female    DOB: 02/04/1976, 44 y.o.   MRN: [id number]   Patient Care Team: Bland, Scott, DO as PCP - General (Family Medicine) McLean, Dalton S, MD as PCP - Cardiology (Cardiology) Camnitz, Will Martin, MD as PCP - Electrophysiology (Cardiology) Uris, Jenna H, LCSW as Social Worker (Licensed Clinical Social Worker)  Patient Active Problem List   Diagnosis Date Noted  . Hypokalemia 10/13/2019  . Hyponatremia 10/13/2019  . Encounter for therapeutic drug monitoring 10/07/2019  . Acute on chronic congestive heart failure (HCC)   . Hypertensive urgency 09/30/2019  . Elevated troponin 09/30/2019  . Hypertensive emergency 09/30/2019  . Attempting to conceive 03/10/2019  . Acute on chronic systolic CHF (congestive heart failure) (HCC) 02/19/2019  . Asthma   . Yeast infection 02/06/2019  . Right ventricular failure (HCC) 02/06/2019  . Morbid obesity (HCC) 02/06/2019  . HFrEF (heart failure with reduced ejection fraction) (HCC) 12/31/2018  . Paroxysmal atrial fibrillation (HCC) 12/31/2018  . Anxiety 12/31/2018  . Hyperlipidemia associated with type 2 diabetes mellitus (HCC) 10/30/2017  . Obstructive sleep apnea 06/30/2015  . Anemia due to blood loss, chronic 06/27/2012  . Allergic rhinitis 06/27/2012  . Major depressive disorder 02/08/2012  . Hypertension associated with diabetes (HCC) 12/04/2011  . Dysmenorrhea 11/22/2011  . Vaginal yeast infection 05/19/2011  . DM2 (diabetes mellitus, type 2) (HCC) 05/19/2011  . PCOS (polycystic ovarian syndrome) 05/19/2011  . Hydrosalpinx 08/21/2009    Current Outpatient Medications:  .  Accu-Chek Softclix Lancets lancets, Use as instructed, Disp: 100 each, Rfl: 12 .  Accu-Chek Softclix Lancets lancets, Check cbg 2 times daily, Disp: 100 each, Rfl: 12 .  albuterol (VENTOLIN HFA) 108 (90 Base) MCG/ACT inhaler, Inhale 2 puffs into the lungs every 6 (six) hours as needed for wheezing or shortness  of breath., Disp: 18 g, Rfl: 1 .  atorvastatin (LIPITOR) 40 MG tablet, Take 1 tablet (40 mg total) by mouth daily at 6 PM., Disp: 30 tablet, Rfl: 3 .  Blood Glucose Monitoring Suppl (ACCU-CHEK AVIVA PLUS) w/Device KIT, 1 application by Does not apply route QID. Check fasting sugars as well as throughout the day, Disp: 1 kit, Rfl: 0 .  busPIRone (BUSPAR) 7.5 MG tablet, Take 1 tablet (7.5 mg total) by mouth 2 (two) times daily., Disp: 180 tablet, Rfl: 0 .  carvedilol (COREG) 12.5 MG tablet, Take 1.5 tablets (18.75 mg total) by mouth 2 (two) times daily with a meal., Disp: 90 tablet, Rfl: 3 .  digoxin (LANOXIN) 0.125 MG tablet, Take 1 tablet (0.125 mg total) by mouth daily., Disp: 30 tablet, Rfl: 3 .  empagliflozin (JARDIANCE) 25 MG TABS tablet, Take 25 mg by mouth daily., Disp: 90 tablet, Rfl: 3 .  gabapentin (NEURONTIN) 100 MG capsule, Take 1 capsule (100 mg total) by mouth every 8 (eight) hours., Disp: 90 capsule, Rfl: 0 .  glucose blood (ACCU-CHEK AVIVA PLUS) test strip, Check CBG 2 times per day, Disp: 100 each, Rfl: 12 .  glucose blood (AGAMATRIX PRESTO TEST) test strip, Use as instructed, Disp: 100 each, Rfl: 12 .  insulin glargine (LANTUS) 100 UNIT/ML injection, Inject 0.22 mLs (22 Units total) into the skin daily., Disp: 10 mL, Rfl: 11 .  Lancets Misc. (ACCU-CHEK SOFTCLIX LANCET DEV) KIT, 1 application by Does not apply route 2 (two) times daily., Disp: 1 kit, Rfl: 0 .  meclizine (ANTIVERT) 12.5 MG tablet, Take 1 tablet (12.5 mg total) by mouth 3 (three) times daily   as needed for dizziness., Disp: 30 tablet, Rfl: 0 .  methocarbamol (ROBAXIN) 500 MG tablet, Take 1 tablet (500 mg total) by mouth every 8 (eight) hours as needed for muscle spasms., Disp: 30 tablet, Rfl: 0 .  potassium chloride SA (KLOR-CON) 20 MEQ tablet, Take 1 tablet by mouth every Monday Wednesday and Friday, Disp: , Rfl:  .  sacubitril-valsartan (ENTRESTO) 97-103 MG, Take 1 tablet by mouth 2 (two) times daily., Disp: 180 tablet,  Rfl: 3 .  spironolactone (ALDACTONE) 25 MG tablet, Take 1 tablet (25 mg total) by mouth daily., Disp: 30 tablet, Rfl: 3 .  terconazole (TERAZOL 3) 0.8 % vaginal cream, Place 1 applicator vaginally at bedtime., Disp: 20 g, Rfl: 0 .  torsemide (DEMADEX) 20 MG tablet, Take 2 tablets (40 mg total) by mouth daily., Disp: 60 tablet, Rfl: 3 .  traZODone (DESYREL) 50 MG tablet, Take 50 mg by mouth at bedtime as needed for sleep., Disp: , Rfl:  .  warfarin (COUMADIN) 7.5 MG tablet, Take as directed by [facility] Clinic (Patient taking differently: Take 7.5 mg by mouth See admin instructions. 7.5 mg (7.5 mg x 1) every Sun, Thu; 11.25 mg (7.5 mg x 1.5) all other days), Disp: 45 tablet, Rfl: 2 Allergies  Allergen Reactions  . Shellfish Allergy Swelling    Airway involvement including EMS - Has Epi-Pen  . Dulaglutide Nausea And Vomiting and Other (See Comments)    Multiple episodes of vomiting over multiple days  . Metformin And Related Diarrhea  . Ibuprofen     Per PCP interferes with daily meds       Social History   Socioeconomic History  . Marital status: Single    Spouse name: Not on file  . Number of children: Not on file  . Years of education: Not on file  . Highest education level: Not on file  Occupational History  . Not on file  Tobacco Use  . Smoking status: Never Smoker  . Smokeless tobacco: Never Used  Vaping Use  . Vaping Use: Never used  Substance and Sexual Activity  . Alcohol use: No    Alcohol/week: 0.0 standard drinks  . Drug use: No  . Sexual activity: Yes  Other Topics Concern  . Not on file  Social History Narrative  . Not on file   Social Determinants of Health   Financial Resource Strain: High Risk  . Difficulty of Paying Living Expenses: Hard  Food Insecurity:   . Worried About Charity fundraiser in the Last Year:   . Arboriculturist in the Last Year:   Transportation Needs:   . Film/video editor (Medical):   Marland Kitchen Lack of Transportation (Non-Medical):    Physical Activity:   . Days of Exercise per Week:   . Minutes of Exercise per Session:   Stress:   . Feeling of Stress :   Social Connections:   . Frequency of Communication with Friends and Family:   . Frequency of Social Gatherings with Friends and Family:   . Attends Religious Services:   . Active Member of Clubs or Organizations:   . Attends Archivist Meetings:   Marland Kitchen Marital Status:   Intimate Partner Violence:   . Fear of Current or Ex-Partner:   . Emotionally Abused:   Marland Kitchen Physically Abused:   . Sexually Abused:     Physical Exam Cardiovascular:     Rate and Rhythm: Normal rate and regular rhythm.     Pulses: Normal  pulses.  Pulmonary:     Effort: Pulmonary effort is normal.     Breath sounds: Normal breath sounds.  Abdominal:     General: There is no distension.  Musculoskeletal:        General: Normal range of motion.     Right lower leg: No edema.     Left lower leg: No edema.  Skin:    General: Skin is warm and dry.     Capillary Refill: Capillary refill takes less than 2 seconds.  Neurological:     Mental Status: She is alert and oriented to person, place, and time.  Psychiatric:        Mood and Affect: Mood normal.         Future Appointments  Date Time Provider [place]  01/07/2020  8:30 AM Constance Haw, MD [facility]  01/07/2020  9:30 AM [facility]  01/23/2020 10:00 AM [facility] None  04/03/2020  1:40 PM Larey Dresser, MD [facility] None    BP (!) 150/98 (BP Location: Right Arm, Patient Position: Sitting, Cuff Size: Normal)   Pulse 91   Resp 18   Wt 190 lb (86.2 kg)   SpO2 99%   BMI 38.38 kg/m   Weight yesterday- 190 lb Last visit weight- 191 lb  [name] was seen at home today and reported feeling generally well. She denied chest pain, SOB, headache, orthopnea fever or cough since our last visit. She complained of some dizziness which she  associated with her increase in coreg last week but stated it has gotten progressively better over the last several days. She was hypertensive but stated she had only just taken her medications prior to my arrival. She reported being compliant with her medications and her weight has been stable. Her medications were verified and her pillbox was refilled. I will follow up next week.   [name], EMT 01/02/20  ACTION: Home visit completed Next visit planned for 1 week

## 2020-01-07 ENCOUNTER — Ambulatory Visit: Payer: Self-pay | Admitting: Cardiology

## 2020-01-08 MED FILL — SPIRONOLACTONE 25 MG TABS: 25 | 30 days supply | Qty: 30 | Fill #1

## 2020-01-08 MED FILL — TORSEMIDE 20 MG TABLET: 20 | 30 days supply | Qty: 60 | Fill #1

## 2020-01-08 MED FILL — busPIRone HCL 7.5 MG TABS: 7.5 | 30 days supply | Qty: 60 | Fill #0

## 2020-01-08 MED FILL — POTASSIUM CHLORIDE CRYS ER: 20 | 28 days supply | Qty: 12 | Fill #1

## 2020-01-09 ENCOUNTER — Other Ambulatory Visit (HOSPITAL_COMMUNITY): Payer: Self-pay

## 2020-01-09 NOTE — Progress Notes (Signed)
Paramedicine Encounter    Patient ID: [name], female    DOB: 1975-08-18, 44 y.o.   MRN: [id number]   Patient Care Team: [name], DO as PCP - General (Family Medicine) Larey Dresser, MD as PCP - Cardiology (Cardiology) Constance Haw, MD as PCP - Electrophysiology (Cardiology) [name], LCSW as Social Worker (Licensed Clinical Social Worker)  Patient Active Problem List   Diagnosis Date Noted  . Hypokalemia 10/13/2019  . Hyponatremia 10/13/2019  . Encounter for therapeutic drug monitoring 10/07/2019  . Acute on chronic congestive heart failure ([place])   . Hypertensive urgency 09/30/2019  . Elevated troponin 09/30/2019  . Hypertensive emergency 09/30/2019  . Attempting to conceive 03/10/2019  . Acute on chronic systolic CHF (congestive heart failure) ([place]) 02/19/2019  . Asthma   . Yeast infection 02/06/2019  . Right ventricular failure ([place]) 02/06/2019  . Morbid obesity ([place]) 02/06/2019  . HFrEF (heart failure with reduced ejection fraction) ([place]) 12/31/2018  . Paroxysmal atrial fibrillation ([place]) 12/31/2018  . Anxiety 12/31/2018  . Hyperlipidemia associated with type 2 diabetes mellitus ([place]) 10/30/2017  . Obstructive sleep apnea 06/30/2015  . Anemia due to blood loss, chronic 06/27/2012  . Allergic rhinitis 06/27/2012  . Major depressive disorder 02/08/2012  . Hypertension associated with diabetes ([place]) 12/04/2011  . Dysmenorrhea 11/22/2011  . Vaginal yeast infection 05/19/2011  . DM2 (diabetes mellitus, type 2) ([place]) 05/19/2011  . PCOS (polycystic ovarian syndrome) 05/19/2011  . Hydrosalpinx 08/21/2009    Current Outpatient Medications:  .  albuterol (VENTOLIN HFA) 108 (90 Base) MCG/ACT inhaler, Inhale 2 puffs into the lungs every 6 (six) hours as needed for wheezing or shortness of breath., Disp: 18 g, Rfl: 1 .  atorvastatin (LIPITOR) 40 MG tablet, Take 1 tablet (40 mg total) by mouth daily at 6 PM., Disp: 30 tablet, Rfl: 3 .  busPIRone (BUSPAR)  7.5 MG tablet, Take 1 tablet (7.5 mg total) by mouth 2 (two) times daily., Disp: 180 tablet, Rfl: 0 .  carvedilol (COREG) 12.5 MG tablet, Take 1.5 tablets (18.75 mg total) by mouth 2 (two) times daily with a meal., Disp: 90 tablet, Rfl: 3 .  digoxin (LANOXIN) 0.125 MG tablet, Take 1 tablet (0.125 mg total) by mouth daily., Disp: 30 tablet, Rfl: 3 .  empagliflozin (JARDIANCE) 25 MG TABS tablet, Take 25 mg by mouth daily., Disp: 90 tablet, Rfl: 3 .  gabapentin (NEURONTIN) 100 MG capsule, Take 1 capsule (100 mg total) by mouth every 8 (eight) hours., Disp: 90 capsule, Rfl: 0 .  insulin glargine (LANTUS) 100 UNIT/ML injection, Inject 0.22 mLs (22 Units total) into the skin daily., Disp: 10 mL, Rfl: 11 .  meclizine (ANTIVERT) 12.5 MG tablet, Take 1 tablet (12.5 mg total) by mouth 3 (three) times daily as needed for dizziness., Disp: 30 tablet, Rfl: 0 .  potassium chloride SA (KLOR-CON) 20 MEQ tablet, Take 1 tablet by mouth every Monday Wednesday and Friday, Disp: , Rfl:  .  sacubitril-valsartan (ENTRESTO) 97-103 MG, Take 1 tablet by mouth 2 (two) times daily., Disp: 180 tablet, Rfl: 3 .  spironolactone (ALDACTONE) 25 MG tablet, Take 1 tablet (25 mg total) by mouth daily., Disp: 30 tablet, Rfl: 3 .  torsemide (DEMADEX) 20 MG tablet, Take 2 tablets (40 mg total) by mouth daily., Disp: 60 tablet, Rfl: 3 .  warfarin (COUMADIN) 7.5 MG tablet, Take as directed by [facility] Clinic (Patient taking differently: Take 7.5 mg by mouth See admin instructions. 7.5 mg (7.5 mg x 1) every  Sun, Thu; 11.25 mg (7.5 mg x 1.5) all other days), Disp: 45 tablet, Rfl: 2 .  Accu-Chek Softclix Lancets lancets, Use as instructed, Disp: 100 each, Rfl: 12 .  Accu-Chek Softclix Lancets lancets, Check cbg 2 times daily, Disp: 100 each, Rfl: 12 .  Blood Glucose Monitoring Suppl (ACCU-CHEK AVIVA PLUS) w/Device KIT, 1 application by Does not apply route QID. Check fasting sugars as well as throughout the day, Disp: 1 kit, Rfl: 0 .  glucose  blood (ACCU-CHEK AVIVA PLUS) test strip, Check CBG 2 times per day, Disp: 100 each, Rfl: 12 .  glucose blood (AGAMATRIX PRESTO TEST) test strip, Use as instructed, Disp: 100 each, Rfl: 12 .  Lancets Misc. (ACCU-CHEK SOFTCLIX LANCET DEV) KIT, 1 application by Does not apply route 2 (two) times daily., Disp: 1 kit, Rfl: 0 .  methocarbamol (ROBAXIN) 500 MG tablet, Take 1 tablet (500 mg total) by mouth every 8 (eight) hours as needed for muscle spasms. (Patient not taking: Reported on 01/02/2020), Disp: 30 tablet, Rfl: 0 .  terconazole (TERAZOL 3) 0.8 % vaginal cream, Place 1 applicator vaginally at bedtime., Disp: 20 g, Rfl: 0 .  traZODone (DESYREL) 50 MG tablet, Take 50 mg by mouth at bedtime as needed for sleep., Disp: , Rfl:  Allergies  Allergen Reactions  . Shellfish Allergy Swelling    Airway involvement including EMS - Has Epi-Pen  . Dulaglutide Nausea And Vomiting and Other (See Comments)    Multiple episodes of vomiting over multiple days  . Metformin And Related Diarrhea  . Ibuprofen     Per PCP interferes with daily meds       Social History   Socioeconomic History  . Marital status: Single    Spouse name: Not on file  . Number of children: Not on file  . Years of education: Not on file  . Highest education level: Not on file  Occupational History  . Not on file  Tobacco Use  . Smoking status: Never Smoker  . Smokeless tobacco: Never Used  Vaping Use  . Vaping Use: Never used  Substance and Sexual Activity  . Alcohol use: No    Alcohol/week: 0.0 standard drinks  . Drug use: No  . Sexual activity: Yes  Other Topics Concern  . Not on file  Social History Narrative  . Not on file   Social Determinants of Health   Financial Resource Strain: High Risk  . Difficulty of Paying Living Expenses: Hard  Food Insecurity:   . Worried About Charity fundraiser in the Last Year:   . Arboriculturist in the Last Year:   Transportation Needs:   . Film/video editor  (Medical):   Marland Kitchen Lack of Transportation (Non-Medical):   Physical Activity:   . Days of Exercise per Week:   . Minutes of Exercise per Session:   Stress:   . Feeling of Stress :   Social Connections:   . Frequency of Communication with Friends and Family:   . Frequency of Social Gatherings with Friends and Family:   . Attends Religious Services:   . Active Member of Clubs or Organizations:   . Attends Archivist Meetings:   Marland Kitchen Marital Status:   Intimate Partner Violence:   . Fear of Current or Ex-Partner:   . Emotionally Abused:   Marland Kitchen Physically Abused:   . Sexually Abused:     Physical Exam Cardiovascular:     Rate and Rhythm: Normal rate and regular rhythm.  Pulses: Normal pulses.  Pulmonary:     Effort: Pulmonary effort is normal.  Musculoskeletal:        General: Normal range of motion.     Right lower leg: No edema.     Left lower leg: No edema.  Skin:    General: Skin is warm and dry.     Capillary Refill: Capillary refill takes less than 2 seconds.  Neurological:     Mental Status: She is alert and oriented to person, place, and time.  Psychiatric:        Mood and Affect: Mood normal.         Future Appointments  Date Time Provider [place]  01/10/2020  1:00 PM [facility]  01/23/2020 10:00 AM [facility] None  04/03/2020  1:40 PM Larey Dresser, MD [facility] None    BP (!) 145/93 (BP Location: Left Arm, Patient Position: Sitting, Cuff Size: Normal)   Pulse 74   Resp 16   Wt 189 lb (85.7 kg)   SpO2 98%   BMI 38.17 kg/m   Weight yesterday- 191 lb Last visit weight- 190 lb  Ms Louis was seen at home today and reported feeling well. She denied chest pain, SOB, headache, dizziness, orthopnea, fever or cough since our last visit. She stated she has been slightly anxious due to her daughter staying with her over the past week but she has left now and Ms Karrer believes things will return  to normal now regarding her stress level. She stated she has also had some depression which resulted in her missing an INR check on Tuesday. We were able to reschedule that appointment for tomorrow while I was there. Her medications were verified and she had refilled her pillbox prior to my arrival so it was checked for accuracy. I will follow up next week.   [name], EMT 01/09/20  ACTION: Home visit completed Next visit planned for 1 week

## 2020-01-10 ENCOUNTER — Ambulatory Visit (INDEPENDENT_AMBULATORY_CARE_PROVIDER_SITE_OTHER): Payer: Self-pay

## 2020-01-10 ENCOUNTER — Other Ambulatory Visit: Payer: Self-pay

## 2020-01-10 DIAGNOSIS — I480 Paroxysmal atrial fibrillation: Secondary | ICD-10-CM

## 2020-01-10 DIAGNOSIS — Z5181 Encounter for therapeutic drug level monitoring: Secondary | ICD-10-CM

## 2020-01-10 LAB — POCT INR: INR: 1.5 — AB (ref 2.0–3.0)

## 2020-01-10 MED ORDER — WARFARIN SODIUM 7.5 MG PO TABS: ORAL_TABLET | ORAL | 1 refills | Status: DC

## 2020-01-10 MED FILL — WARFARIN SODIUM 7.5 MG TAB: 7.5 | 30 days supply | Qty: 45 | Fill #0

## 2020-01-10 NOTE — Patient Instructions (Signed)
Description   Take 2 tablets today and tomorrow, then resume same dosage 1.5 tablets daily except for 1 tablet on Sundays and Thursdays. Recheck INR in 2 weeks. Call coumadin clinic for any questions [phone].

## 2020-01-16 ENCOUNTER — Other Ambulatory Visit (HOSPITAL_COMMUNITY): Payer: Self-pay

## 2020-01-16 NOTE — Progress Notes (Signed)
Paramedicine Encounter    Patient ID: [name], female    DOB: 10/20/1975, 44 y.o.   MRN: [id number]   Patient Care Team: Bland, Scott, DO as PCP - General (Family Medicine) McLean, Dalton S, MD as PCP - Cardiology (Cardiology) Camnitz, Will Martin, MD as PCP - Electrophysiology (Cardiology) Uris, Jenna H, LCSW as Social Worker (Licensed Clinical Social Worker)  Patient Active Problem List   Diagnosis Date Noted  . Hypokalemia 10/13/2019  . Hyponatremia 10/13/2019  . Encounter for therapeutic drug monitoring 10/07/2019  . Acute on chronic congestive heart failure (HCC)   . Hypertensive urgency 09/30/2019  . Elevated troponin 09/30/2019  . Hypertensive emergency 09/30/2019  . Attempting to conceive 03/10/2019  . Acute on chronic systolic CHF (congestive heart failure) (HCC) 02/19/2019  . Asthma   . Yeast infection 02/06/2019  . Right ventricular failure (HCC) 02/06/2019  . Morbid obesity (HCC) 02/06/2019  . HFrEF (heart failure with reduced ejection fraction) (HCC) 12/31/2018  . Paroxysmal atrial fibrillation (HCC) 12/31/2018  . Anxiety 12/31/2018  . Hyperlipidemia associated with type 2 diabetes mellitus (HCC) 10/30/2017  . Obstructive sleep apnea 06/30/2015  . Anemia due to blood loss, chronic 06/27/2012  . Allergic rhinitis 06/27/2012  . Major depressive disorder 02/08/2012  . Hypertension associated with diabetes (HCC) 12/04/2011  . Dysmenorrhea 11/22/2011  . Vaginal yeast infection 05/19/2011  . DM2 (diabetes mellitus, type 2) (HCC) 05/19/2011  . PCOS (polycystic ovarian syndrome) 05/19/2011  . Hydrosalpinx 08/21/2009    Current Outpatient Medications:  .  Accu-Chek Softclix Lancets lancets, Use as instructed, Disp: 100 each, Rfl: 12 .  Accu-Chek Softclix Lancets lancets, Check cbg 2 times daily, Disp: 100 each, Rfl: 12 .  albuterol (VENTOLIN HFA) 108 (90 Base) MCG/ACT inhaler, Inhale 2 puffs into the lungs every 6 (six) hours as needed for wheezing or shortness  of breath., Disp: 18 g, Rfl: 1 .  atorvastatin (LIPITOR) 40 MG tablet, Take 1 tablet (40 mg total) by mouth daily at 6 PM., Disp: 30 tablet, Rfl: 3 .  Blood Glucose Monitoring Suppl (ACCU-CHEK AVIVA PLUS) w/Device KIT, 1 application by Does not apply route QID. Check fasting sugars as well as throughout the day, Disp: 1 kit, Rfl: 0 .  busPIRone (BUSPAR) 7.5 MG tablet, Take 1 tablet (7.5 mg total) by mouth 2 (two) times daily., Disp: 180 tablet, Rfl: 0 .  carvedilol (COREG) 12.5 MG tablet, Take 1.5 tablets (18.75 mg total) by mouth 2 (two) times daily with a meal., Disp: 90 tablet, Rfl: 3 .  digoxin (LANOXIN) 0.125 MG tablet, Take 1 tablet (0.125 mg total) by mouth daily., Disp: 30 tablet, Rfl: 3 .  empagliflozin (JARDIANCE) 25 MG TABS tablet, Take 25 mg by mouth daily., Disp: 90 tablet, Rfl: 3 .  gabapentin (NEURONTIN) 100 MG capsule, Take 1 capsule (100 mg total) by mouth every 8 (eight) hours., Disp: 90 capsule, Rfl: 0 .  glucose blood (ACCU-CHEK AVIVA PLUS) test strip, Check CBG 2 times per day, Disp: 100 each, Rfl: 12 .  glucose blood (AGAMATRIX PRESTO TEST) test strip, Use as instructed, Disp: 100 each, Rfl: 12 .  insulin glargine (LANTUS) 100 UNIT/ML injection, Inject 0.22 mLs (22 Units total) into the skin daily., Disp: 10 mL, Rfl: 11 .  Lancets Misc. (ACCU-CHEK SOFTCLIX LANCET DEV) KIT, 1 application by Does not apply route 2 (two) times daily., Disp: 1 kit, Rfl: 0 .  meclizine (ANTIVERT) 12.5 MG tablet, Take 1 tablet (12.5 mg total) by mouth 3 (three) times daily   as needed for dizziness., Disp: 30 tablet, Rfl: 0 .  methocarbamol (ROBAXIN) 500 MG tablet, Take 1 tablet (500 mg total) by mouth every 8 (eight) hours as needed for muscle spasms. (Patient not taking: Reported on 01/02/2020), Disp: 30 tablet, Rfl: 0 .  potassium chloride SA (KLOR-CON) 20 MEQ tablet, Take 1 tablet by mouth every Monday Wednesday and Friday, Disp: , Rfl:  .  sacubitril-valsartan (ENTRESTO) 97-103 MG, Take 1 tablet by  mouth 2 (two) times daily., Disp: 180 tablet, Rfl: 3 .  spironolactone (ALDACTONE) 25 MG tablet, Take 1 tablet (25 mg total) by mouth daily., Disp: 30 tablet, Rfl: 3 .  terconazole (TERAZOL 3) 0.8 % vaginal cream, Place 1 applicator vaginally at bedtime., Disp: 20 g, Rfl: 0 .  torsemide (DEMADEX) 20 MG tablet, Take 2 tablets (40 mg total) by mouth daily., Disp: 60 tablet, Rfl: 3 .  traZODone (DESYREL) 50 MG tablet, Take 50 mg by mouth at bedtime as needed for sleep., Disp: , Rfl:  .  warfarin (COUMADIN) 7.5 MG tablet, Take as directed by Coumadin Clinic, Disp: 45 tablet, Rfl: 1 Allergies  Allergen Reactions  . Shellfish Allergy Swelling    Airway involvement including EMS - Has Epi-Pen  . Dulaglutide Nausea And Vomiting and Other (See Comments)    Multiple episodes of vomiting over multiple days  . Metformin And Related Diarrhea  . Ibuprofen     Per PCP interferes with daily meds       Social History   Socioeconomic History  . Marital status: Single    Spouse name: Not on file  . Number of children: Not on file  . Years of education: Not on file  . Highest education level: Not on file  Occupational History  . Not on file  Tobacco Use  . Smoking status: Never Smoker  . Smokeless tobacco: Never Used  Vaping Use  . Vaping Use: Never used  Substance and Sexual Activity  . Alcohol use: No    Alcohol/week: 0.0 standard drinks  . Drug use: No  . Sexual activity: Yes  Other Topics Concern  . Not on file  Social History Narrative  . Not on file   Social Determinants of Health   Financial Resource Strain: High Risk  . Difficulty of Paying Living Expenses: Hard  Food Insecurity:   . Worried About Charity fundraiser in the Last Year:   . Arboriculturist in the Last Year:   Transportation Needs:   . Film/video editor (Medical):   Marland Kitchen Lack of Transportation (Non-Medical):   Physical Activity:   . Days of Exercise per Week:   . Minutes of Exercise per Session:   Stress:    . Feeling of Stress :   Social Connections:   . Frequency of Communication with Friends and Family:   . Frequency of Social Gatherings with Friends and Family:   . Attends Religious Services:   . Active Member of Clubs or Organizations:   . Attends Archivist Meetings:   Marland Kitchen Marital Status:   Intimate Partner Violence:   . Fear of Current or Ex-Partner:   . Emotionally Abused:   Marland Kitchen Physically Abused:   . Sexually Abused:     Physical Exam Cardiovascular:     Rate and Rhythm: Normal rate and regular rhythm.     Pulses: Normal pulses.  Pulmonary:     Effort: Pulmonary effort is normal.     Breath sounds: Normal breath sounds.  Musculoskeletal:  General: Normal range of motion.     Right lower leg: No edema.     Left lower leg: No edema.  Skin:    General: Skin is warm and dry.     Capillary Refill: Capillary refill takes less than 2 seconds.  Neurological:     Mental Status: She is alert and oriented to person, place, and time.  Psychiatric:        Mood and Affect: Mood normal.         Future Appointments  Date Time Provider Department Center  01/23/2020 10:00 AM [facility] None  01/24/2020  1:15 PM [facility]  04/03/2020  1:40 PM McLean, Dalton S, MD [facility] None    BP (!) 131/92 (BP Location: Left Arm, Patient Position: Sitting, Cuff Size: Large)   Pulse 88   Resp 16   Wt 189 lb (85.7 kg)   SpO2 98%   BMI 38.17 kg/m   Weight yesterday- 188 lb Last visit weight- 189 lb  Ms Gordner was seen at home today and reported feeling well. She denied chest pain, SOB, Headache, dizziness, orthopnea, fever or cough since our last visit. She reported being compliant with her medications over the past week and her weight has been stable. She said she [place] considering moving [place] in [place] to get away from family stressors so I asked that she let me or Jenna know so we can help he get established with new  providers if necessary. She was understanding and agreeable. Her medications were verified and she was aided in refilling her pillbox.    R , EMT 01/16/20  ACTION: Home visit completed Next visit planned for 1 week       

## 2020-01-16 NOTE — Progress Notes (Signed)
PCP: [name], DO PCP-Cardiologist: Dr. Aundra Dubin  EP- Dr. Curt Bears   HPI: 44 y.o.with history of type 2 diabetes (poorly controlled, last hgb A1c 11.1%), poorly controlled HTN, hyperlipidemia, paroxysmal atrial fibrillation, and chronic systolic CHF of uncertain etiology. Patient had an echo in 12/2018 as outpatient due to exertional dyspnea. This showed EF 20-25%, moderate RV dysfunction. She was also noted to have paroxysmal atrial fibrillation. She was started on Xarelto. In 01/2019, she was admitted with DKA. In 02/2019, she was admitted with CHF exacerbation and diuresed. She was sent home on furosemide 40 mg po bid.   Readmitted on 03/2019 with CHF exacerbation, in the setting of elevated BP. Diuresed with IV furosemide.  LHC showed nonobstructive CAD. RHC showed R>L heart failure with low PAPI suggesting significant RV dysfunction. She was transitioned to PO torsemide and treated with guideline directed medical therapy with Entresto, Spironolactone, carvedilol and digoxin. She was also transitioned off of Xarelto and placed on Coumadin.  Readmitted 08/31/9483 with A/C systolic heart failure. She had not been taking her furosemide because she said it was on her AVS to stop it. Diuresed with IV furosemide and transitioned to torsemide 40 mg daily. Weight day before discharge was 190 pounds. Echo with LV EF <20%, RV severely decreased systolic function.   Recently returned to HF Clinic for follow up with Dr. Aundra Dubin on 12/27/19. BP was elevated in clinic, she had not taken her morning medications.  She is followed by paramedicine and generally had been taking her meds recently.  She reported getting short of breath after 1 block or lifting a heavy object.  No orthopnea/PND.  No palpitations.  No chest pain. Weight was up 11 lbs, had been eating more and not exercising.   Today she returns to HF clinic for pharmacist medication titration. At last visit with MD, carvedilol was increased to 18.75 mg  BID. Overall she is doing well today. Noted occasional dizziness in the morning, but this does not happen very often. She thinks dizziness might be related to when her BP is elevated. No SOB/DOE with mild-moderate activity. She was able to walk around the entire grocery store without stopping yesterday. Does note breathing has been more difficult in the warmer weather. Her weight has been stable at home, 188-189 lbs. She takes torsemide 40 mg daily and has not needed any extra. No LEE, PND or orthopnea. Taking all medications as prescribed and tolerating all medications.   HF Medications: Carvedilol 18.75 mg BID Entresto 97/103 mg BID Spironolactone 25 mg daily Jardiance 25 mg daily Digoxin 0.125 mg daily Torsemide 40 mg daily Potassium 20 mEq three times per week  Has the patient been experiencing any side effects to the medications prescribed?  no  Does the patient have any problems obtaining medications due to transportation or finances?   Yes - no prescription insurance; Has [employer] patient assistance for [employer] and [employer] patient assistance for [place]. Uses HF fund through [facility] for other heart failure medications.   Understanding of regimen: fair Understanding of indications: fair Potential of compliance: fair Patient understands to avoid NSAIDs. Patient understands to avoid decongestants.   Pertinent Lab Values (12/27/19): Marland Kitchen Serum creatinine 0.77, BUN 8, Potassium 4.1, Sodium 136, Digoxin 0.4 ng/mL (12/27/19)   Vital Signs: . Weight: 192 lbs (last clinic weight: 191.6 lbs) . Blood pressure: 140/74 . Heart rate: 71   Assessment: 1. Acute on chronic systolic CHF: Nonischemic cardiomyopathy, diagnosed in 6/20. Repeat Echo 03/2019 showed EF 20-25%,  diffuse hypokinesis, LV thrombus, moderate RV systolic dysfunction, mild MR, moderate TR. Echo in 3/21 with EF <30%, severe RV dysfunction. She has a history of paroxysmal atrial fibrillation but seems to  be mostly in sinus rhythm. This is not likely to be a tachycardia-mediated cardiomyopathy. Poorly controlled diabetes itself can cause a cardiomyopathy, as can poorly controlled HTN. Myocarditis is a potential cause of her cardiomyopathy. [place]aden 9/20 showed nonobstructive CAD. RHC showed R>L heart failure with low PAPI suggesting significant RV dysfunction.  Limited echo on 01/02/20 showed some improvement in EF to 35%.  - NYHA class II symptoms. She is not volume overloaded on exam. - Continue torsemide 40 mg daily.   - Increase carvedilol to 25 mg BID - Continue Entresto 97/103 mg BID.  - Continue spironolactone 25 mg daily.  - Continue empagliflozin (Jardiance) 25 mg daily. - Continue digoxin.Digoxin level 0.4 ng/mL on 12/27/19.   - Continue w/ paramedicine. Appreciate their assistance.  - She previously had been trying to get pregnant (has been difficult with polycystic ovarian syndrome). She has 2 grown children already.With her cardiomyopathy and active CHF, pregnancy would create a significant mortality risk for her. A number of her cardiac meds to control her CHF are feto-toxic. She understands this risk and is no longer trying to get pregnant. Knows importance of using contraception to avoid pregnancy.  2. 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  - Compliant w/ coumadin. INRs followed at [facility].  3. Atrial fibrillation: Paroxysmal. NSR today.  -Continue warfarin (using warfarin rather than DOAC with h/o LV thrombus). INRs followed by PCP - Would consider atrial fibrillation ablation in the future, if recurrence, given [facility]-HF data.  4. Type 2 diabetes: Poor control, per PCP.  - Hgb A1C 11%. She just started back on insulin recently  -Continue empagliflozin 25 mg daily.   5. Hypertension: - BP 140/74 today - Improved with improved med compliance. Appreciate paramedicine's assistance    Plan: 1) Medication changes: Based on clinical presentation, vital signs and recent  labs will increase carvedilol to 25 mg BID.  2) Follow-up: 2 months with Dr. Rush Farmer, PharmD, BCPS, [facility], [place] [facility] Pharmacist [phone]

## 2020-01-17 MED FILL — WARFARIN SODIUM 7.5 MG TAB: 7.5 | 30 days supply | Qty: 45 | Fill #0

## 2020-01-22 ENCOUNTER — Other Ambulatory Visit: Payer: Self-pay

## 2020-01-22 ENCOUNTER — Telehealth (HOSPITAL_COMMUNITY): Payer: Self-pay

## 2020-01-22 DIAGNOSIS — F330 Major depressive disorder, recurrent, mild: Secondary | ICD-10-CM

## 2020-01-22 MED FILL — CARVEDILOL 12.5 MG TABLET: 12.5 | 30 days supply | Qty: 90 | Fill #0

## 2020-01-22 MED FILL — ATORVASTATIN 40 MG TABLET: 40 | 30 days supply | Qty: 30 | Fill #2

## 2020-01-22 MED FILL — DIGOXIN 0.125 MG TABLET: 125 | 30 days supply | Qty: 30 | Fill #2

## 2020-01-22 NOTE — Telephone Encounter (Signed)
I called [name] to remind her of her clinic appointment tomorrow. She did not answer so I left a message reminding her of the appointment and advising her to bring her medications.   [name], EMT 01/22/20

## 2020-01-23 ENCOUNTER — Ambulatory Visit (HOSPITAL_COMMUNITY)
Admission: RE | Admit: 2020-01-23 | Discharge: 2020-01-23 | Disposition: A | Discharge status: Home / Self Care | Payer: Medicaid Other | Source: Ambulatory Visit | Attending: Internal Medicine | Admitting: Internal Medicine

## 2020-01-23 ENCOUNTER — Other Ambulatory Visit (HOSPITAL_COMMUNITY): Payer: Self-pay | Admitting: Cardiology

## 2020-01-23 ENCOUNTER — Other Ambulatory Visit (HOSPITAL_COMMUNITY): Payer: Self-pay

## 2020-01-23 ENCOUNTER — Other Ambulatory Visit: Payer: Self-pay | Admitting: Family Medicine

## 2020-01-23 ENCOUNTER — Other Ambulatory Visit: Payer: Self-pay

## 2020-01-23 VITALS — BP 140/74 | HR 71 | Wt 192.0 lb

## 2020-01-23 DIAGNOSIS — I480 Paroxysmal atrial fibrillation: Secondary | ICD-10-CM | POA: Diagnosis not present

## 2020-01-23 DIAGNOSIS — Z79899 Other long term (current) drug therapy: Secondary | ICD-10-CM | POA: Diagnosis not present

## 2020-01-23 DIAGNOSIS — E785 Hyperlipidemia, unspecified: Secondary | ICD-10-CM | POA: Diagnosis not present

## 2020-01-23 DIAGNOSIS — Z794 Long term (current) use of insulin: Secondary | ICD-10-CM | POA: Diagnosis not present

## 2020-01-23 DIAGNOSIS — I5022 Chronic systolic (congestive) heart failure: Secondary | ICD-10-CM

## 2020-01-23 DIAGNOSIS — E118 Type 2 diabetes mellitus with unspecified complications: Secondary | ICD-10-CM | POA: Insufficient documentation

## 2020-01-23 DIAGNOSIS — I5023 Acute on chronic systolic (congestive) heart failure: Secondary | ICD-10-CM | POA: Insufficient documentation

## 2020-01-23 DIAGNOSIS — I110 Hypertensive heart disease with heart failure: Secondary | ICD-10-CM | POA: Diagnosis not present

## 2020-01-23 DIAGNOSIS — I428 Other cardiomyopathies: Secondary | ICD-10-CM | POA: Diagnosis not present

## 2020-01-23 MED ORDER — CARVEDILOL 25 MG PO TABS: 25.0000 mg | ORAL_TABLET | Freq: Two times a day (BID) | ORAL | 3 refills | Status: DC

## 2020-01-23 MED FILL — CARVEDILOL 25 MG TABLET: 25 | 30 days supply | Qty: 60 | Fill #0

## 2020-01-23 NOTE — Patient Instructions (Addendum)
It was a pleasure seeing you today!  MEDICATIONS: -We are changing your medications today -Increase carvedilol to 25 mg (1 tablet) twice daily.  -Call if you have questions about your medications.  NEXT APPOINTMENT: Return to clinic in 2 months with Dr. Aundra Dubin.  In general, to take care of your heart failure: -Limit your fluid intake to 2 Liters (half-gallon) per day.   -Limit your salt intake to ideally 2-3 grams (2000-3000 mg) per day. -Weigh yourself daily and record, and bring that "weight diary" to your next appointment.  (Weight gain of 2-3 pounds in 1 day typically means fluid weight.) -The medications for your heart are to help your heart and help you live longer.   -Please contact us before stopping any of your heart medications.  Call the clinic at [phone] with questions or to reschedule future appointments.

## 2020-01-23 NOTE — Progress Notes (Signed)
Paramedicine Encounter   Patient ID: [name] , female,   DOB: 19-Oct-1975,44 y.o.,  MRN: [id number]  [name] was seen at the HF clinic this morning with pharmacist, [name]. Per [name] we are increasing carvedilol to 25 mg BID. [name] was understanding and agreeable. She has an INR check tomorrow which she is aware of and advised she would be at that appointment and she is able to add coumadin to her pillbox when she gets her new dosing. I will follow up in two weeks after she returns from a vacation.  [name], EMT 01/23/2020   ACTION: Next visit planned for 2 weeks

## 2020-01-24 ENCOUNTER — Ambulatory Visit (INDEPENDENT_AMBULATORY_CARE_PROVIDER_SITE_OTHER): Payer: Self-pay | Admitting: *Deleted

## 2020-01-24 DIAGNOSIS — Z5181 Encounter for therapeutic drug level monitoring: Secondary | ICD-10-CM

## 2020-01-24 DIAGNOSIS — I480 Paroxysmal atrial fibrillation: Secondary | ICD-10-CM

## 2020-01-24 LAB — POCT INR: INR: 2.8 (ref 2.0–3.0)

## 2020-01-24 MED FILL — GABAPENTIN 100 MG CAPSULE: 100 | 30 days supply | Qty: 90 | Fill #0

## 2020-01-24 NOTE — Patient Instructions (Signed)
Description   Continue same dosage 1.5 tablets daily except for 1 tablet on Sundays and Thursdays. Recheck INR in 3 weeks. Call coumadin clinic for any questions [phone].     

## 2020-01-28 ENCOUNTER — Other Ambulatory Visit: Payer: Self-pay | Admitting: Family Medicine

## 2020-01-28 MED ORDER — TRAZODONE HCL 50 MG PO TABS: 50.0000 mg | ORAL_TABLET | Freq: Every evening | ORAL | 0 refills | Status: DC | PRN

## 2020-01-28 MED ORDER — HYDROXYZINE HCL 10 MG PO TABS
10.0000 mg | ORAL_TABLET | Freq: Three times a day (TID) | ORAL | 0 refills | Status: DC | PRN
Start: 2020-01-28 — End: 2020-02-10

## 2020-01-28 MED FILL — hydrOXYzine HCL 10 MG TABS: 10 | 10 days supply | Qty: 30 | Fill #0

## 2020-01-28 MED FILL — ?TRAZODONE HCL 50 TABS: 50 | 30 days supply | Qty: 30 | Fill #0

## 2020-02-06 ENCOUNTER — Telehealth (HOSPITAL_COMMUNITY): Payer: Self-pay | Admitting: Licensed Clinical Social Worker

## 2020-02-06 ENCOUNTER — Telehealth (HOSPITAL_COMMUNITY): Payer: Self-pay

## 2020-02-06 NOTE — Telephone Encounter (Signed)
CSW informed by [employer] that pt is currently struggling with depression and is having trouble getting out of bed.  CSW attempted to call pt to check in- unable to reach- left VM requesting return call  [name], [facility] Desk#: [phone] Cell#: [phone]

## 2020-02-06 NOTE — Telephone Encounter (Signed)
I called Ms Seder to schedule an appointment. She did not answer so I left a message requesting that she call me back.  [name], EMT 02/06/20

## 2020-02-07 ENCOUNTER — Other Ambulatory Visit (HOSPITAL_COMMUNITY): Payer: Self-pay

## 2020-02-07 ENCOUNTER — Telehealth (HOSPITAL_COMMUNITY): Payer: Self-pay | Admitting: Licensed Clinical Social Worker

## 2020-02-07 NOTE — Progress Notes (Signed)
Paramedicine Encounter    Patient ID: [name], female    DOB: 1976-03-04, 44 y.o.   MRN: [id number]   Patient Care Team: Gifford Shave, MD as PCP - General (Family Medicine) Larey Dresser, MD as PCP - Cardiology (Cardiology) Constance Haw, MD as PCP - Electrophysiology (Cardiology) [name], LCSW as Social Worker (Licensed Clinical Social Worker)  Patient Active Problem List   Diagnosis Date Noted  . Hypokalemia 10/13/2019  . Hyponatremia 10/13/2019  . Encounter for therapeutic drug monitoring 10/07/2019  . Acute on chronic congestive heart failure ([place])   . Hypertensive urgency 09/30/2019  . Elevated troponin 09/30/2019  . Hypertensive emergency 09/30/2019  . Attempting to conceive 03/10/2019  . Acute on chronic systolic CHF (congestive heart failure) ([place]) 02/19/2019  . Asthma   . Yeast infection 02/06/2019  . Right ventricular failure ([place]) 02/06/2019  . Morbid obesity ([place]) 02/06/2019  . HFrEF (heart failure with reduced ejection fraction) ([place]) 12/31/2018  . Paroxysmal atrial fibrillation ([place]) 12/31/2018  . Anxiety 12/31/2018  . Hyperlipidemia associated with type 2 diabetes mellitus ([place]) 10/30/2017  . Obstructive sleep apnea 06/30/2015  . Anemia due to blood loss, chronic 06/27/2012  . Allergic rhinitis 06/27/2012  . Major depressive disorder 02/08/2012  . Hypertension associated with diabetes ([place]) 12/04/2011  . Dysmenorrhea 11/22/2011  . Vaginal yeast infection 05/19/2011  . DM2 (diabetes mellitus, type 2) ([place]) 05/19/2011  . PCOS (polycystic ovarian syndrome) 05/19/2011  . Hydrosalpinx 08/21/2009    Current Outpatient Medications:  .  Accu-Chek Softclix Lancets lancets, Use as instructed, Disp: 100 each, Rfl: 12 .  Accu-Chek Softclix Lancets lancets, Check cbg 2 times daily, Disp: 100 each, Rfl: 12 .  albuterol (VENTOLIN HFA) 108 (90 Base) MCG/ACT inhaler, Inhale 2 puffs into the lungs every 6 (six) hours as needed for wheezing or  shortness of breath., Disp: 18 g, Rfl: 1 .  atorvastatin (LIPITOR) 40 MG tablet, Take 1 tablet (40 mg total) by mouth daily at 6 PM., Disp: 30 tablet, Rfl: 3 .  Blood Glucose Monitoring Suppl (ACCU-CHEK AVIVA PLUS) w/Device KIT, 1 application by Does not apply route QID. Check fasting sugars as well as throughout the day, Disp: 1 kit, Rfl: 0 .  busPIRone (BUSPAR) 7.5 MG tablet, Take 1 tablet (7.5 mg total) by mouth 2 (two) times daily., Disp: 180 tablet, Rfl: 0 .  carvedilol (COREG) 25 MG tablet, Take 1 tablet (25 mg total) by mouth 2 (two) times daily with a meal., Disp: 180 tablet, Rfl: 3 .  digoxin (LANOXIN) 0.125 MG tablet, Take 1 tablet (0.125 mg total) by mouth daily., Disp: 30 tablet, Rfl: 3 .  empagliflozin (JARDIANCE) 25 MG TABS tablet, Take 25 mg by mouth daily., Disp: 90 tablet, Rfl: 3 .  gabapentin (NEURONTIN) 100 MG capsule, TAKE 1 CAPSULE (100 MG TOTAL) BY MOUTH EVERY 8 (EIGHT) HOURS., Disp: 90 capsule, Rfl: 0 .  glucose blood (ACCU-CHEK AVIVA PLUS) test strip, Check CBG 2 times per day, Disp: 100 each, Rfl: 12 .  glucose blood (AGAMATRIX PRESTO TEST) test strip, Use as instructed, Disp: 100 each, Rfl: 12 .  hydrOXYzine (ATARAX/VISTARIL) 10 MG tablet, Take 1 tablet (10 mg total) by mouth 3 (three) times daily as needed., Disp: 30 tablet, Rfl: 0 .  insulin glargine (LANTUS) 100 UNIT/ML injection, Inject 0.22 mLs (22 Units total) into the skin daily., Disp: 10 mL, Rfl: 11 .  Lancets Misc. (ACCU-CHEK SOFTCLIX LANCET DEV) KIT, 1 application by Does not apply route 2 (two)  times daily., Disp: 1 kit, Rfl: 0 .  meclizine (ANTIVERT) 12.5 MG tablet, Take 1 tablet (12.5 mg total) by mouth 3 (three) times daily as needed for dizziness., Disp: 30 tablet, Rfl: 0 .  methocarbamol (ROBAXIN) 500 MG tablet, Take 1 tablet (500 mg total) by mouth every 8 (eight) hours as needed for muscle spasms., Disp: 30 tablet, Rfl: 0 .  potassium chloride SA (KLOR-CON) 20 MEQ tablet, Take 1 tablet by mouth every Monday  Wednesday and Friday, Disp: , Rfl:  .  sacubitril-valsartan (ENTRESTO) 97-103 MG, Take 1 tablet by mouth 2 (two) times daily., Disp: 180 tablet, Rfl: 3 .  spironolactone (ALDACTONE) 25 MG tablet, Take 1 tablet (25 mg total) by mouth daily., Disp: 30 tablet, Rfl: 3 .  terconazole (TERAZOL 3) 0.8 % vaginal cream, Place 1 applicator vaginally at bedtime., Disp: 20 g, Rfl: 0 .  torsemide (DEMADEX) 20 MG tablet, Take 2 tablets (40 mg total) by mouth daily., Disp: 60 tablet, Rfl: 3 .  traZODone (DESYREL) 50 MG tablet, Take 1 tablet (50 mg total) by mouth at bedtime as needed for sleep., Disp: 30 tablet, Rfl: 0 .  warfarin (COUMADIN) 7.5 MG tablet, Take as directed by Coumadin Clinic, Disp: 45 tablet, Rfl: 1 Allergies  Allergen Reactions  . Shellfish Allergy Swelling    Airway involvement including EMS - Has Epi-Pen  . Dulaglutide Nausea And Vomiting and Other (See Comments)    Multiple episodes of vomiting over multiple days  . Metformin And Related Diarrhea  . Ibuprofen     Per PCP interferes with daily meds       Social History   Socioeconomic History  . Marital status: Single    Spouse name: Not on file  . Number of children: Not on file  . Years of education: Not on file  . Highest education level: Not on file  Occupational History  . Not on file  Tobacco Use  . Smoking status: Never Smoker  . Smokeless tobacco: Never Used  Vaping Use  . Vaping Use: Never used  Substance and Sexual Activity  . Alcohol use: No    Alcohol/week: 0.0 standard drinks  . Drug use: No  . Sexual activity: Yes  Other Topics Concern  . Not on file  Social History Narrative  . Not on file   Social Determinants of Health   Financial Resource Strain: High Risk  . Difficulty of Paying Living Expenses: Hard  Food Insecurity:   . Worried About Charity fundraiser in the Last Year:   . Arboriculturist in the Last Year:   Transportation Needs:   . Film/video editor (Medical):   Marland Kitchen Lack of  Transportation (Non-Medical):   Physical Activity:   . Days of Exercise per Week:   . Minutes of Exercise per Session:   Stress:   . Feeling of Stress :   Social Connections:   . Frequency of Communication with Friends and Family:   . Frequency of Social Gatherings with Friends and Family:   . Attends Religious Services:   . Active Member of Clubs or Organizations:   . Attends Archivist Meetings:   Marland Kitchen Marital Status:   Intimate Partner Violence:   . Fear of Current or Ex-Partner:   . Emotionally Abused:   Marland Kitchen Physically Abused:   . Sexually Abused:     Physical Exam Cardiovascular:     Rate and Rhythm: Normal rate and regular rhythm.     Pulses:  Normal pulses.  Pulmonary:     Effort: Pulmonary effort is normal.     Breath sounds: Normal breath sounds.  Musculoskeletal:        General: Normal range of motion.     Right lower leg: No edema.     Left lower leg: No edema.  Skin:    General: Skin is warm and dry.     Capillary Refill: Capillary refill takes less than 2 seconds.  Neurological:     Mental Status: She is alert and oriented to person, place, and time.  Psychiatric:        Mood and Affect: Mood normal.         Future Appointments  Date Time Provider [place]  02/10/2020  1:30 PM Gifford Shave, MD [facility]  02/14/2020  1:15 PM [facility]  04/03/2020  1:40 PM Larey Dresser, MD [facility] None    BP (!) 165/108 (BP Location: Left Arm, Patient Position: Sitting, Cuff Size: Large)   Pulse 96   Resp 16   Wt 193 lb (87.5 kg)   SpO2 99%   BMI 38.98 kg/m   Weight yesterday- 194 lb Last visit weight- 192 lb  Ms Poehlman was seen at home today and reported feeling depressed but physically well. She denied chest pain, SOB, headache, dizziness, orthopnea, fever or cough over the past two weeks. She reported being compliant with her medications since our last visit and her weight has been stable. She  stated she had not taken them yet this morning because she just got out of bed when I arrived and ad a result she was notably hypertensive. Her medications were verified and her pillbox was refilled. She advised that she recently received a phone call saying she was awarded disability and medicaid so she will be on the look out for paperwork in the mail. I will follow up next week.   [name], EMT 02/07/20  ACTION: Home visit completed Next visit planned for 1 week

## 2020-02-07 NOTE — Telephone Encounter (Signed)
CSW called pt this morning to check in regarding concerns with depression.  Pt reports she has just had a lot of changes recently and is feeling overwhelmed- wishes she could move in to her own place so she would have some personal space to reset when she is overwhelmed like this.    CSW discussed possibility of going to therapy so she can talk to someone about the way she is feeling but pt states when she gets like this she doesn't want to talk to anyone and just shuts down.    CSW asked pt how she was caring for herself and pt reports she made plans to go out with a friend today  Encouraged pt to reach out if she changes her mind about therapy or would like to discuss current stressors with CSW- pt expressed understanding  [name], [facility] Desk#: [phone] Cell#: [phone]

## 2020-02-10 ENCOUNTER — Ambulatory Visit (INDEPENDENT_AMBULATORY_CARE_PROVIDER_SITE_OTHER): Payer: Medicaid Other | Admitting: Family Medicine

## 2020-02-10 ENCOUNTER — Other Ambulatory Visit: Payer: Self-pay

## 2020-02-10 ENCOUNTER — Encounter: Payer: Self-pay | Admitting: Family Medicine

## 2020-02-10 ENCOUNTER — Other Ambulatory Visit: Payer: Self-pay | Admitting: Family Medicine

## 2020-02-10 VITALS — BP 132/84 | HR 76 | Wt 194.0 lb

## 2020-02-10 DIAGNOSIS — Z1159 Encounter for screening for other viral diseases: Secondary | ICD-10-CM

## 2020-02-10 DIAGNOSIS — F419 Anxiety disorder, unspecified: Secondary | ICD-10-CM

## 2020-02-10 DIAGNOSIS — E1165 Type 2 diabetes mellitus with hyperglycemia: Secondary | ICD-10-CM

## 2020-02-10 DIAGNOSIS — F330 Major depressive disorder, recurrent, mild: Secondary | ICD-10-CM | POA: Diagnosis not present

## 2020-02-10 MED ORDER — GABAPENTIN 100 MG PO CAPS: 100.0000 mg | ORAL_CAPSULE | Freq: Three times a day (TID) | ORAL | 0 refills | Status: DC

## 2020-02-10 MED ORDER — TRAZODONE HCL 50 MG PO TABS: 50.0000 mg | ORAL_TABLET | Freq: Every evening | ORAL | 0 refills | Status: DC | PRN

## 2020-02-10 MED ORDER — HYDROXYZINE HCL 10 MG PO TABS: 10.0000 mg | ORAL_TABLET | Freq: Three times a day (TID) | ORAL | 0 refills | Status: DC | PRN

## 2020-02-10 MED ORDER — BUSPIRONE HCL 7.5 MG PO TABS: 7.5000 mg | ORAL_TABLET | Freq: Two times a day (BID) | ORAL | 0 refills | Status: DC

## 2020-02-10 MED FILL — hydrOXYzine HCL 10 MG TABS: 10 | 10 days supply | Qty: 30 | Fill #0

## 2020-02-10 MED FILL — busPIRone HCL 7.5 MG TABS: 7.5 | 30 days supply | Qty: 60 | Fill #0

## 2020-02-10 MED FILL — traZODone HCL 50 MG TABS: 50 | 30 days supply | Qty: 30 | Fill #0

## 2020-02-10 MED FILL — GABAPENTIN 100 MG CAPSULE: 100 | 30 days supply | Qty: 90 | Fill #0

## 2020-02-10 NOTE — Assessment & Plan Note (Signed)
Patient reports she is still anxious and has issues with depression but denies any SI or HI at this time.  PHQ-9 was 14 today from 17 at last check.  Patient would like refills on her BuSpar, hydroxyzine, trazodone. -Prescription sent for these medications -PHQ-9 at next visit

## 2020-02-10 NOTE — Progress Notes (Signed)
° ° °  SUBJECTIVE:   CHIEF COMPLAINT / HPI:   Diabetes Patient reports she does not check her blood sugars daily because she does not have to.  She only checks them when she feels bad and she has a home health nurse that comes and checks her blood sugars weekly for her.  The lowest she is seen was between 101 50.  She denies any blood sugars below 100.  She takes Lantus 22 units daily and Jardiance which she reports good compliance with.  She reports she is out of her Neurontin and is having leg pain which she attributes to her peripheral neuropathy.  Anxiety-depression Patient reports she had a bad week last week with her depression and anxiety.  Reports she just felt down and was having difficulty sleeping.  Denies any SI or HI.  Is requesting refills on her buspirone, trazodone, Atarax.   OBJECTIVE:   BP 132/84    Pulse 76    Wt 194 lb (88 kg)    SpO2 99%    BMI 39.18 kg/m   General: Well-appearing, no acute distress, sitting in chair next to exam table Cardiac: Regular rate and rhythm, no murmurs appreciated Respiratory: Lungs are clear to auscultation bilaterally with normal work of breathing Abdomen: Soft, nontender, positive bowel sounds Extremities: No peripheral edema  Diabetic foot exam was performed with the following findings:   No deformities, ulcerations, or other skin breakdown Normal sensation of 10g monofilament Intact posterior tibialis and dorsalis pedis pulses     ASSESSMENT/PLAN:   DM2 (diabetes mellitus, type 2) ([place]) Patient reports she does not regularly check her blood sugars but that she has a home health nurse that comes to check her blood sugars and they have been well maintained between 101 50.  Hemoglobin A1c at last check was 2 months ago and was 9.3.  Patient reports compliance with her medications. -Continue home medications at this time including Lantus 22 units daily and Jardiance -Follow-up in 1 month for diabetes checkup -Repeat hemoglobin A1c at  next check -Refill patient's Neurontin  Anxiety Patient reports she is still anxious and has issues with depression but denies any SI or HI at this time.  PHQ-9 was 14 today from 17 at last check.  Patient would like refills on her BuSpar, hydroxyzine, trazodone. -Prescription sent for these medications -PHQ-9 at next visit      Gifford Shave, MD [place]

## 2020-02-10 NOTE — Assessment & Plan Note (Addendum)
Patient reports she does not regularly check her blood sugars but that she has a home health nurse that comes to check her blood sugars and they have been well maintained between 101 50.  Hemoglobin A1c at last check was 2 months ago and was 9.3.  Patient reports compliance with her medications. -Continue home medications at this time including Lantus 22 units daily and Jardiance -Follow-up in 1 month for diabetes checkup -Repeat hemoglobin A1c at next check -Refill patient's Neurontin

## 2020-02-10 NOTE — Patient Instructions (Addendum)
It was a pleasure to meet you today.  I am glad your blood sugars are staying within normal limits.  I would like to see you in 1 month for a diabetes follow-up visit where we can check your hemoglobin A1c and may make some medication changes.  For now we will continue on your current medication regimen.  Regarding your stress and anxiety I have refilled your buspirone.  I have also refilled your hydroxyzine, gabapentin, trazodone.  I am going to collect some blood work on you today to screen for hepatitis C, this is something we screen our patients for.  I have also sent a referral in for ophthalmology.  You can use your own eye doctor if you wish for the diabetic exam.  In 1 month I would also like to do your Pap smear because you are in need of one at this time.  If you have any questions or concerns please feel free to call our clinic.  I hope you have a wonderful afternoon!

## 2020-02-13 ENCOUNTER — Telehealth (HOSPITAL_COMMUNITY): Payer: Self-pay

## 2020-02-13 NOTE — Telephone Encounter (Signed)
I called Ms Giannotti to schedule an appointment. She stated she would be available after her INR check tomorrow at 13:00. We agreed to meet at 14:30.   [name], EMT 02/13/20

## 2020-02-14 ENCOUNTER — Other Ambulatory Visit: Payer: Self-pay

## 2020-02-14 ENCOUNTER — Ambulatory Visit (INDEPENDENT_AMBULATORY_CARE_PROVIDER_SITE_OTHER): Payer: Self-pay

## 2020-02-14 ENCOUNTER — Other Ambulatory Visit (HOSPITAL_COMMUNITY): Payer: Self-pay

## 2020-02-14 DIAGNOSIS — I480 Paroxysmal atrial fibrillation: Secondary | ICD-10-CM

## 2020-02-14 DIAGNOSIS — Z5181 Encounter for therapeutic drug level monitoring: Secondary | ICD-10-CM

## 2020-02-14 LAB — POCT INR: INR: 1.9 — AB (ref 2.0–3.0)

## 2020-02-14 NOTE — Progress Notes (Signed)
Paramedicine Encounter    Patient ID: [name], female    DOB: Nov 09, 1975, 44 y.o.   MRN: [id number]   Patient Care Team: Gifford Shave, MD as PCP - General (Family Medicine) Larey Dresser, MD as PCP - Cardiology (Cardiology) Constance Haw, MD as PCP - Electrophysiology (Cardiology) [name], LCSW as Social Worker (Licensed Clinical Social Worker)  Patient Active Problem List   Diagnosis Date Noted  . Hypokalemia 10/13/2019  . Hyponatremia 10/13/2019  . Encounter for therapeutic drug monitoring 10/07/2019  . Acute on chronic congestive heart failure ([place])   . Hypertensive urgency 09/30/2019  . Elevated troponin 09/30/2019  . Hypertensive emergency 09/30/2019  . Attempting to conceive 03/10/2019  . Acute on chronic systolic CHF (congestive heart failure) ([place]) 02/19/2019  . Asthma   . Yeast infection 02/06/2019  . Right ventricular failure ([place]) 02/06/2019  . Morbid obesity ([place]) 02/06/2019  . HFrEF (heart failure with reduced ejection fraction) ([place]) 12/31/2018  . Paroxysmal atrial fibrillation ([place]) 12/31/2018  . Anxiety 12/31/2018  . Hyperlipidemia associated with type 2 diabetes mellitus ([place]) 10/30/2017  . Obstructive sleep apnea 06/30/2015  . Anemia due to blood loss, chronic 06/27/2012  . Allergic rhinitis 06/27/2012  . Major depressive disorder 02/08/2012  . Hypertension associated with diabetes ([place]) 12/04/2011  . Dysmenorrhea 11/22/2011  . Vaginal yeast infection 05/19/2011  . DM2 (diabetes mellitus, type 2) ([place]) 05/19/2011  . PCOS (polycystic ovarian syndrome) 05/19/2011  . Hydrosalpinx 08/21/2009    Current Outpatient Medications:  .  albuterol (VENTOLIN HFA) 108 (90 Base) MCG/ACT inhaler, Inhale 2 puffs into the lungs every 6 (six) hours as needed for wheezing or shortness of breath., Disp: 18 g, Rfl: 1 .  atorvastatin (LIPITOR) 40 MG tablet, Take 1 tablet (40 mg total) by mouth daily at 6 PM., Disp: 30 tablet, Rfl: 3 .  busPIRone  (BUSPAR) 7.5 MG tablet, Take 1 tablet (7.5 mg total) by mouth 2 (two) times daily., Disp: 180 tablet, Rfl: 0 .  carvedilol (COREG) 25 MG tablet, Take 1 tablet (25 mg total) by mouth 2 (two) times daily with a meal., Disp: 180 tablet, Rfl: 3 .  digoxin (LANOXIN) 0.125 MG tablet, Take 1 tablet (0.125 mg total) by mouth daily., Disp: 30 tablet, Rfl: 3 .  empagliflozin (JARDIANCE) 25 MG TABS tablet, Take 25 mg by mouth daily., Disp: 90 tablet, Rfl: 3 .  gabapentin (NEURONTIN) 100 MG capsule, Take 1 capsule (100 mg total) by mouth every 8 (eight) hours., Disp: 90 capsule, Rfl: 0 .  hydrOXYzine (ATARAX/VISTARIL) 10 MG tablet, Take 1 tablet (10 mg total) by mouth 3 (three) times daily as needed., Disp: 30 tablet, Rfl: 0 .  insulin glargine (LANTUS) 100 UNIT/ML injection, Inject 0.22 mLs (22 Units total) into the skin daily., Disp: 10 mL, Rfl: 11 .  potassium chloride SA (KLOR-CON) 20 MEQ tablet, Take 1 tablet by mouth every Monday Wednesday and Friday, Disp: , Rfl:  .  sacubitril-valsartan (ENTRESTO) 97-103 MG, Take 1 tablet by mouth 2 (two) times daily., Disp: 180 tablet, Rfl: 3 .  spironolactone (ALDACTONE) 25 MG tablet, Take 1 tablet (25 mg total) by mouth daily., Disp: 30 tablet, Rfl: 3 .  torsemide (DEMADEX) 20 MG tablet, Take 2 tablets (40 mg total) by mouth daily., Disp: 60 tablet, Rfl: 3 .  traZODone (DESYREL) 50 MG tablet, Take 1 tablet (50 mg total) by mouth at bedtime as needed for sleep., Disp: 30 tablet, Rfl: 0 .  warfarin (COUMADIN) 7.5 MG tablet,  Take as directed by Coumadin Clinic, Disp: 45 tablet, Rfl: 1 .  Accu-Chek Softclix Lancets lancets, Use as instructed, Disp: 100 each, Rfl: 12 .  Accu-Chek Softclix Lancets lancets, Check cbg 2 times daily, Disp: 100 each, Rfl: 12 .  Blood Glucose Monitoring Suppl (ACCU-CHEK AVIVA PLUS) w/Device KIT, 1 application by Does not apply route QID. Check fasting sugars as well as throughout the day, Disp: 1 kit, Rfl: 0 .  glucose blood (ACCU-CHEK AVIVA  PLUS) test strip, Check CBG 2 times per day, Disp: 100 each, Rfl: 12 .  glucose blood (AGAMATRIX PRESTO TEST) test strip, Use as instructed, Disp: 100 each, Rfl: 12 .  Lancets Misc. (ACCU-CHEK SOFTCLIX LANCET DEV) KIT, 1 application by Does not apply route 2 (two) times daily., Disp: 1 kit, Rfl: 0 .  meclizine (ANTIVERT) 12.5 MG tablet, Take 1 tablet (12.5 mg total) by mouth 3 (three) times daily as needed for dizziness. (Patient not taking: Reported on 02/07/2020), Disp: 30 tablet, Rfl: 0 .  methocarbamol (ROBAXIN) 500 MG tablet, Take 1 tablet (500 mg total) by mouth every 8 (eight) hours as needed for muscle spasms. (Patient not taking: Reported on 02/07/2020), Disp: 30 tablet, Rfl: 0 .  terconazole (TERAZOL 3) 0.8 % vaginal cream, Place 1 applicator vaginally at bedtime., Disp: 20 g, Rfl: 0 Allergies  Allergen Reactions  . Shellfish Allergy Swelling    Airway involvement including EMS - Has Epi-Pen  . Dulaglutide Nausea And Vomiting and Other (See Comments)    Multiple episodes of vomiting over multiple days  . Metformin And Related Diarrhea  . Ibuprofen     Per PCP interferes with daily meds       Social History   Socioeconomic History  . Marital status: Single    Spouse name: Not on file  . Number of children: Not on file  . Years of education: Not on file  . Highest education level: Not on file  Occupational History  . Not on file  Tobacco Use  . Smoking status: Never Smoker  . Smokeless tobacco: Never Used  Vaping Use  . Vaping Use: Never used  Substance and Sexual Activity  . Alcohol use: No    Alcohol/week: 0.0 standard drinks  . Drug use: No  . Sexual activity: Yes  Other Topics Concern  . Not on file  Social History Narrative  . Not on file   Social Determinants of Health   Financial Resource Strain: High Risk  . Difficulty of Paying Living Expenses: Hard  Food Insecurity:   . Worried About Charity fundraiser in the Last Year:   . Arboriculturist in the  Last Year:   Transportation Needs:   . Film/video editor (Medical):   Marland Kitchen Lack of Transportation (Non-Medical):   Physical Activity:   . Days of Exercise per Week:   . Minutes of Exercise per Session:   Stress:   . Feeling of Stress :   Social Connections:   . Frequency of Communication with Friends and Family:   . Frequency of Social Gatherings with Friends and Family:   . Attends Religious Services:   . Active Member of Clubs or Organizations:   . Attends Archivist Meetings:   Marland Kitchen Marital Status:   Intimate Partner Violence:   . Fear of Current or Ex-Partner:   . Emotionally Abused:   Marland Kitchen Physically Abused:   . Sexually Abused:     Physical Exam Cardiovascular:     Rate  and Rhythm: Normal rate and regular rhythm.     Pulses: Normal pulses.  Pulmonary:     Effort: Pulmonary effort is normal.     Breath sounds: Normal breath sounds.  Musculoskeletal:        General: Normal range of motion.     Right lower leg: No edema.     Left lower leg: No edema.  Skin:    General: Skin is warm and dry.     Capillary Refill: Capillary refill takes less than 2 seconds.  Neurological:     Mental Status: She is alert and oriented to person, place, and time.  Psychiatric:        Mood and Affect: Mood normal.         Future Appointments  Date Time Provider [place]  03/09/2020  1:00 PM [facility]  04/03/2020  1:40 PM Larey Dresser, MD [facility] None    BP 118/76 (BP Location: Left Arm, Patient Position: Sitting, Cuff Size: Large)   Pulse 78   Resp 16   Wt 193 lb (87.5 kg)   SpO2 98%   BMI 38.98 kg/m   Weight yesterday- did not weigh Last visit weight- 194 lb  [name] was sen at home today and reported feeling well. She denied chest pain, SOB, headache, dizziness, orthopnea, fever or cough over the past week. She stated she has been compliant with her medications and her weight has been stable. Her medications  were verified and her pillbox was checked for accuracy. I noted a couple of errors which were brought to her attention and corrected so she will know for next time. I will follow up next week.   [name], EMT 02/14/20  ACTION: Home visit completed Next visit planned for 1 week

## 2020-02-14 NOTE — Patient Instructions (Signed)
Take extra 0.5 tablet today and then continue same dosage 1.5 tablets daily except for 1 tablet on Sundays and Thursdays. Recheck INR in 3 weeks. Call coumadin clinic for any questions [phone].

## 2020-02-27 ENCOUNTER — Telehealth (HOSPITAL_COMMUNITY): Payer: Self-pay

## 2020-02-27 MED FILL — ?TRAZODONE HCL 50 TABS: 50 | 30 days supply | Qty: 30 | Fill #0

## 2020-02-27 MED FILL — hydrOXYzine HCL 10 MG TABS: 10 | 10 days supply | Qty: 30 | Fill #0

## 2020-02-27 MED FILL — GABAPENTIN 100 MG CAPSULE: 100 | 30 days supply | Qty: 90 | Fill #0

## 2020-02-27 MED FILL — CARVEDILOL 25 MG TABLET: 25 | 30 days supply | Qty: 60 | Fill #0

## 2020-02-27 MED FILL — busPIRone HCL 7.5 MG TABS: 7.5 | 30 days supply | Qty: 60 | Fill #0

## 2020-02-27 NOTE — Telephone Encounter (Signed)
I called Ms Wassenaar to schedule an appointment for today or tomorrow. She stated she was busy today but would be available tomorrow morning. We agreed to meet at 10:00 tomorrow.   [name], EMT 02/27/20

## 2020-02-28 ENCOUNTER — Other Ambulatory Visit (HOSPITAL_COMMUNITY): Payer: Self-pay

## 2020-02-28 NOTE — Progress Notes (Signed)
Paramedicine Encounter    Patient ID: [name], female    DOB: 12/30/1975, 44 y.o.   MRN: [id number]   Patient Care Team: Gifford Shave, MD as PCP - General (Family Medicine) Larey Dresser, MD as PCP - Cardiology (Cardiology) Constance Haw, MD as PCP - Electrophysiology (Cardiology) [name], LCSW as Social Worker (Licensed Clinical Social Worker)  Patient Active Problem List   Diagnosis Date Noted   Hypokalemia 10/13/2019   Hyponatremia 10/13/2019   Encounter for therapeutic drug monitoring 10/07/2019   Acute on chronic congestive heart failure ([place])    Hypertensive urgency 09/30/2019   Elevated troponin 09/30/2019   Hypertensive emergency 09/30/2019   Attempting to conceive 03/10/2019   Acute on chronic systolic CHF (congestive heart failure) ([place]) 02/19/2019   Asthma    Yeast infection 02/06/2019   Right ventricular failure ([place]) 02/06/2019   Morbid obesity ([place]) 02/06/2019   HFrEF (heart failure with reduced ejection fraction) ([place]) 12/31/2018   Paroxysmal atrial fibrillation ([place]) 12/31/2018   Anxiety 12/31/2018   Hyperlipidemia associated with type 2 diabetes mellitus ([place]) 10/30/2017   Obstructive sleep apnea 06/30/2015   Anemia due to blood loss, chronic 06/27/2012   Allergic rhinitis 06/27/2012   Major depressive disorder 02/08/2012   Hypertension associated with diabetes ([place]) 12/04/2011   Dysmenorrhea 11/22/2011   Vaginal yeast infection 05/19/2011   DM2 (diabetes mellitus, type 2) ([place]) 05/19/2011   PCOS (polycystic ovarian syndrome) 05/19/2011   Hydrosalpinx 08/21/2009    Current Outpatient Medications:    albuterol (VENTOLIN HFA) 108 (90 Base) MCG/ACT inhaler, Inhale 2 puffs into the lungs every 6 (six) hours as needed for wheezing or shortness of breath., Disp: 18 g, Rfl: 1   atorvastatin (LIPITOR) 40 MG tablet, Take 1 tablet (40 mg total) by mouth daily at 6 PM., Disp: 30 tablet, Rfl: 3   Blood Glucose  Monitoring Suppl (ACCU-CHEK AVIVA PLUS) w/Device KIT, 1 application by Does not apply route QID. Check fasting sugars as well as throughout the day, Disp: 1 kit, Rfl: 0   busPIRone (BUSPAR) 7.5 MG tablet, Take 1 tablet (7.5 mg total) by mouth 2 (two) times daily., Disp: 180 tablet, Rfl: 0   carvedilol (COREG) 25 MG tablet, Take 1 tablet (25 mg total) by mouth 2 (two) times daily with a meal., Disp: 180 tablet, Rfl: 3   digoxin (LANOXIN) 0.125 MG tablet, Take 1 tablet (0.125 mg total) by mouth daily., Disp: 30 tablet, Rfl: 3   empagliflozin (JARDIANCE) 25 MG TABS tablet, Take 25 mg by mouth daily., Disp: 90 tablet, Rfl: 3   gabapentin (NEURONTIN) 100 MG capsule, Take 1 capsule (100 mg total) by mouth every 8 (eight) hours., Disp: 90 capsule, Rfl: 0   hydrOXYzine (ATARAX/VISTARIL) 10 MG tablet, Take 1 tablet (10 mg total) by mouth 3 (three) times daily as needed., Disp: 30 tablet, Rfl: 0   insulin glargine (LANTUS) 100 UNIT/ML injection, Inject 0.22 mLs (22 Units total) into the skin daily., Disp: 10 mL, Rfl: 11   potassium chloride SA (KLOR-CON) 20 MEQ tablet, Take 1 tablet by mouth every Monday Wednesday and Friday, Disp: , Rfl:    sacubitril-valsartan (ENTRESTO) 97-103 MG, Take 1 tablet by mouth 2 (two) times daily., Disp: 180 tablet, Rfl: 3   spironolactone (ALDACTONE) 25 MG tablet, Take 1 tablet (25 mg total) by mouth daily., Disp: 30 tablet, Rfl: 3   torsemide (DEMADEX) 20 MG tablet, Take 2 tablets (40 mg total) by mouth daily., Disp: 60 tablet, Rfl: 3  traZODone (DESYREL) 50 MG tablet, Take 1 tablet (50 mg total) by mouth at bedtime as needed for sleep., Disp: 30 tablet, Rfl: 0   warfarin (COUMADIN) 7.5 MG tablet, Take as directed by [facility]in Clinic, Disp: 45 tablet, Rfl: 1   Accu-Chek Softclix Lancets lancets, Use as instructed, Disp: 100 each, Rfl: 12   Accu-Chek Softclix Lancets lancets, Check cbg 2 times daily, Disp: 100 each, Rfl: 12   glucose blood (ACCU-CHEK AVIVA PLUS)  test strip, Check CBG 2 times per day, Disp: 100 each, Rfl: 12   glucose blood (AGAMATRIX PRESTO TEST) test strip, Use as instructed, Disp: 100 each, Rfl: 12   Lancets Misc. (ACCU-CHEK SOFTCLIX LANCET DEV) KIT, 1 application by Does not apply route 2 (two) times daily., Disp: 1 kit, Rfl: 0   meclizine (ANTIVERT) 12.5 MG tablet, Take 1 tablet (12.5 mg total) by mouth 3 (three) times daily as needed for dizziness. (Patient not taking: Reported on 02/07/2020), Disp: 30 tablet, Rfl: 0   methocarbamol (ROBAXIN) 500 MG tablet, Take 1 tablet (500 mg total) by mouth every 8 (eight) hours as needed for muscle spasms. (Patient not taking: Reported on 02/07/2020), Disp: 30 tablet, Rfl: 0   terconazole (TERAZOL 3) 0.8 % vaginal cream, Place 1 applicator vaginally at bedtime., Disp: 20 g, Rfl: 0 Allergies  Allergen Reactions   Shellfish Allergy Swelling    Airway involvement including EMS - Has Epi-Pen   Dulaglutide Nausea And Vomiting and Other (See Comments)    Multiple episodes of vomiting over multiple days   Metformin And Related Diarrhea   Ibuprofen     Per PCP interferes with daily meds       Social History   Socioeconomic History   Marital status: Single    Spouse name: Not on file   Number of children: Not on file   Years of education: Not on file   Highest education level: Not on file  Occupational History   Not on file  Tobacco Use   Smoking status: Never Smoker   Smokeless tobacco: Never Used  Vaping Use   Vaping Use: Never used  Substance and Sexual Activity   Alcohol use: No    Alcohol/week: 0.0 standard drinks   Drug use: No   Sexual activity: Yes  Other Topics Concern   Not on file  Social History Narrative   Not on file   Social Determinants of Health   Financial Resource Strain: High Risk   Difficulty of Paying Living Expenses: Hard  Food Insecurity:    Worried About Charity fundraiser in the Last Year:    Arboriculturist in the Last  Year:   Transportation Needs:    Film/video editor (Medical):    Lack of Transportation (Non-Medical):   Physical Activity:    Days of Exercise per Week:    Minutes of Exercise per Session:   Stress:    Feeling of Stress :   Social Connections:    Frequency of Communication with Friends and Family:    Frequency of Social Gatherings with Friends and Family:    Attends Religious Services:    Active Member of Clubs or Organizations:    Attends Music therapist:    Marital Status:   Intimate Partner Violence:    Fear of Current or Ex-Partner:    Emotionally Abused:    Physically Abused:    Sexually Abused:     Physical Exam Cardiovascular:     Rate and Rhythm:  Normal rate and regular rhythm.     Pulses: Normal pulses.  Pulmonary:     Effort: Pulmonary effort is normal.     Breath sounds: Normal breath sounds.  Musculoskeletal:        General: Normal range of motion.     Right lower leg: No edema.     Left lower leg: No edema.  Skin:    General: Skin is warm and dry.     Capillary Refill: Capillary refill takes less than 2 seconds.  Neurological:     Mental Status: She is alert and oriented to person, place, and time.  Psychiatric:        Mood and Affect: Mood normal.         Future Appointments  Date Time Provider [place]  03/09/2020  1:00 PM [facility]  04/03/2020  1:40 PM Larey Dresser, MD [facility] None    BP 133/83 (BP Location: Left Arm, Patient Position: Sitting, Cuff Size: Large)    Pulse 91    Resp 16    Wt 193 lb (87.5 kg)    SpO2 98%    BMI 38.98 kg/m   Weight yesterday- 193 lb Last visit weight- 193 lb  [name] was seen at home today and reported feeling well. She denied chest pain, SOB, headache, dizziness, orthopnea, fever or cough over the past week. She stated she has been compliant with her medications and her weight remains stable. Her medications were verified  and her pillbox was checked for accuracy. I noted an error in her coumadin dose so I pointed it out and she advised she would make the change. I will follow up next week.   [name], EMT 02/28/20  ACTION: Home visit completed Next visit planned for 1 week

## 2020-03-09 ENCOUNTER — Ambulatory Visit (INDEPENDENT_AMBULATORY_CARE_PROVIDER_SITE_OTHER): Payer: Medicaid Other | Admitting: Pharmacist

## 2020-03-09 ENCOUNTER — Other Ambulatory Visit: Payer: Self-pay

## 2020-03-09 DIAGNOSIS — I480 Paroxysmal atrial fibrillation: Secondary | ICD-10-CM

## 2020-03-09 DIAGNOSIS — Z5181 Encounter for therapeutic drug level monitoring: Secondary | ICD-10-CM

## 2020-03-09 LAB — POCT INR: INR: 1.3 — AB (ref 2.0–3.0)

## 2020-03-09 NOTE — Patient Instructions (Addendum)
Description   Take 2 tablets tomorrow and Wednesday and then continue same dosage 1.5 tablets daily except for 1 tablet on Sundays and Thursdays. Recheck INR in 2 weeks. Call coumadin clinic for any questions [phone].

## 2020-03-12 ENCOUNTER — Telehealth (HOSPITAL_COMMUNITY): Payer: Self-pay

## 2020-03-12 NOTE — Telephone Encounter (Signed)
I called Ms Trumbo to schedule an appointment. She did not answer so I left a message requesting she call me back.   [name], EMT 03/12/20

## 2020-03-13 ENCOUNTER — Telehealth (HOSPITAL_COMMUNITY): Payer: Self-pay

## 2020-03-13 NOTE — Telephone Encounter (Signed)
I called [name] to schedule an appointment. She stated she was under a lot of stress right now due to her youngest daughter living there now and was not feeling up for a visit. I asked if next week would be better and she said she would let me know. I will reach out again next week.  [name], EMT 03/13/20

## 2020-03-24 ENCOUNTER — Other Ambulatory Visit (HOSPITAL_COMMUNITY): Payer: Self-pay

## 2020-03-24 ENCOUNTER — Telehealth: Payer: Self-pay

## 2020-03-24 MED ORDER — WARFARIN SODIUM 7.5 MG PO TABS: ORAL_TABLET | ORAL | 0 refills | Status: DC

## 2020-03-24 MED FILL — WARFARIN SODIUM 7.5 MG TAB: 7.5 | 15 days supply | Qty: 15 | Fill #0

## 2020-03-24 NOTE — Telephone Encounter (Signed)
Pt missed her appt today 03/24/20 at 9am in [facility].  Called pt, pt states she had a death in the family and forgot to call to cancel and reschedule her appt.  Pt rescheduled appt to next week 04/01/20 at 9:45am.  Pt requested a rx for Warfarin be sent into her phamacy.  Advised pt since she missed her last appt, we can only send in enough Warfarin to get her to her appt.  Once seen in office we can send her in a 30 day refill.  Pt verbalized understanding.

## 2020-03-24 NOTE — Progress Notes (Signed)
Paramedicine Encounter    Patient ID: [name], female    DOB: 08-Jun-1976, 44 y.o.   MRN: [id number]   Patient Care Team: Gifford Shave, MD as PCP - General (Family Medicine) Larey Dresser, MD as PCP - Cardiology (Cardiology) Constance Haw, MD as PCP - Electrophysiology (Cardiology) [name], LCSW as Social Worker (Licensed Clinical Social Worker)  Patient Active Problem List   Diagnosis Date Noted  . Hypokalemia 10/13/2019  . Hyponatremia 10/13/2019  . Encounter for therapeutic drug monitoring 10/07/2019  . Acute on chronic congestive heart failure ([place])   . Hypertensive urgency 09/30/2019  . Elevated troponin 09/30/2019  . Hypertensive emergency 09/30/2019  . Attempting to conceive 03/10/2019  . Acute on chronic systolic CHF (congestive heart failure) ([place]) 02/19/2019  . Asthma   . Yeast infection 02/06/2019  . Right ventricular failure ([place]) 02/06/2019  . Morbid obesity ([place]) 02/06/2019  . HFrEF (heart failure with reduced ejection fraction) ([place]) 12/31/2018  . Paroxysmal atrial fibrillation ([place]) 12/31/2018  . Anxiety 12/31/2018  . Hyperlipidemia associated with type 2 diabetes mellitus ([place]) 10/30/2017  . Obstructive sleep apnea 06/30/2015  . Anemia due to blood loss, chronic 06/27/2012  . Allergic rhinitis 06/27/2012  . Major depressive disorder 02/08/2012  . Hypertension associated with diabetes ([place]) 12/04/2011  . Dysmenorrhea 11/22/2011  . Vaginal yeast infection 05/19/2011  . DM2 (diabetes mellitus, type 2) ([place]) 05/19/2011  . PCOS (polycystic ovarian syndrome) 05/19/2011  . Hydrosalpinx 08/21/2009    Current Outpatient Medications:  .  Accu-Chek Softclix Lancets lancets, Use as instructed, Disp: 100 each, Rfl: 12 .  Accu-Chek Softclix Lancets lancets, Check cbg 2 times daily, Disp: 100 each, Rfl: 12 .  albuterol (VENTOLIN HFA) 108 (90 Base) MCG/ACT inhaler, Inhale 2 puffs into the lungs every 6 (six) hours as needed for wheezing or  shortness of breath., Disp: 18 g, Rfl: 1 .  atorvastatin (LIPITOR) 40 MG tablet, Take 1 tablet (40 mg total) by mouth daily at 6 PM., Disp: 30 tablet, Rfl: 3 .  Blood Glucose Monitoring Suppl (ACCU-CHEK AVIVA PLUS) w/Device KIT, 1 application by Does not apply route QID. Check fasting sugars as well as throughout the day, Disp: 1 kit, Rfl: 0 .  busPIRone (BUSPAR) 7.5 MG tablet, Take 1 tablet (7.5 mg total) by mouth 2 (two) times daily., Disp: 180 tablet, Rfl: 0 .  carvedilol (COREG) 25 MG tablet, Take 1 tablet (25 mg total) by mouth 2 (two) times daily with a meal., Disp: 180 tablet, Rfl: 3 .  digoxin (LANOXIN) 0.125 MG tablet, Take 1 tablet (0.125 mg total) by mouth daily., Disp: 30 tablet, Rfl: 3 .  empagliflozin (JARDIANCE) 25 MG TABS tablet, Take 25 mg by mouth daily., Disp: 90 tablet, Rfl: 3 .  gabapentin (NEURONTIN) 100 MG capsule, Take 1 capsule (100 mg total) by mouth every 8 (eight) hours., Disp: 90 capsule, Rfl: 0 .  glucose blood (ACCU-CHEK AVIVA PLUS) test strip, Check CBG 2 times per day, Disp: 100 each, Rfl: 12 .  glucose blood (AGAMATRIX PRESTO TEST) test strip, Use as instructed, Disp: 100 each, Rfl: 12 .  hydrOXYzine (ATARAX/VISTARIL) 10 MG tablet, Take 1 tablet (10 mg total) by mouth 3 (three) times daily as needed., Disp: 30 tablet, Rfl: 0 .  insulin glargine (LANTUS) 100 UNIT/ML injection, Inject 0.22 mLs (22 Units total) into the skin daily., Disp: 10 mL, Rfl: 11 .  Lancets Misc. (ACCU-CHEK SOFTCLIX LANCET DEV) KIT, 1 application by Does not apply route 2 (two)  times daily., Disp: 1 kit, Rfl: 0 .  meclizine (ANTIVERT) 12.5 MG tablet, Take 1 tablet (12.5 mg total) by mouth 3 (three) times daily as needed for dizziness. (Patient not taking: Reported on 02/07/2020), Disp: 30 tablet, Rfl: 0 .  methocarbamol (ROBAXIN) 500 MG tablet, Take 1 tablet (500 mg total) by mouth every 8 (eight) hours as needed for muscle spasms. (Patient not taking: Reported on 02/07/2020), Disp: 30 tablet, Rfl:  0 .  potassium chloride SA (KLOR-CON) 20 MEQ tablet, Take 1 tablet by mouth every Monday Wednesday and Friday, Disp: , Rfl:  .  sacubitril-valsartan (ENTRESTO) 97-103 MG, Take 1 tablet by mouth 2 (two) times daily., Disp: 180 tablet, Rfl: 3 .  spironolactone (ALDACTONE) 25 MG tablet, Take 1 tablet (25 mg total) by mouth daily., Disp: 30 tablet, Rfl: 3 .  terconazole (TERAZOL 3) 0.8 % vaginal cream, Place 1 applicator vaginally at bedtime., Disp: 20 g, Rfl: 0 .  torsemide (DEMADEX) 20 MG tablet, Take 2 tablets (40 mg total) by mouth daily., Disp: 60 tablet, Rfl: 3 .  traZODone (DESYREL) 50 MG tablet, Take 1 tablet (50 mg total) by mouth at bedtime as needed for sleep., Disp: 30 tablet, Rfl: 0 .  warfarin (COUMADIN) 7.5 MG tablet, Take as directed by Coumadin Clinic, Needs OV for future refills, Disp: 15 tablet, Rfl: 0 Allergies  Allergen Reactions  . Shellfish Allergy Swelling    Airway involvement including EMS - Has Epi-Pen  . Dulaglutide Nausea And Vomiting and Other (See Comments)    Multiple episodes of vomiting over multiple days  . Metformin And Related Diarrhea  . Ibuprofen     Per PCP interferes with daily meds       Social History   Socioeconomic History  . Marital status: Single    Spouse name: Not on file  . Number of children: Not on file  . Years of education: Not on file  . Highest education level: Not on file  Occupational History  . Not on file  Tobacco Use  . Smoking status: Never Smoker  . Smokeless tobacco: Never Used  Vaping Use  . Vaping Use: Never used  Substance and Sexual Activity  . Alcohol use: No    Alcohol/week: 0.0 standard drinks  . Drug use: No  . Sexual activity: Yes  Other Topics Concern  . Not on file  Social History Narrative  . Not on file   Social Determinants of Health   Financial Resource Strain: High Risk  . Difficulty of Paying Living Expenses: Hard  Food Insecurity:   . Worried About Charity fundraiser in the Last Year: Not  on file  . Ran Out of Food in the Last Year: Not on file  Transportation Needs:   . Lack of Transportation (Medical): Not on file  . Lack of Transportation (Non-Medical): Not on file  Physical Activity:   . Days of Exercise per Week: Not on file  . Minutes of Exercise per Session: Not on file  Stress:   . Feeling of Stress : Not on file  Social Connections:   . Frequency of Communication with Friends and Family: Not on file  . Frequency of Social Gatherings with Friends and Family: Not on file  . Attends Religious Services: Not on file  . Active Member of Clubs or Organizations: Not on file  . Attends Archivist Meetings: Not on file  . Marital Status: Not on file  Intimate Partner Violence:   . Fear  of Current or Ex-Partner: Not on file  . Emotionally Abused: Not on file  . Physically Abused: Not on file  . Sexually Abused: Not on file    Physical Exam Cardiovascular:     Rate and Rhythm: Normal rate and regular rhythm.     Pulses: Normal pulses.  Pulmonary:     Effort: Pulmonary effort is normal.     Breath sounds: Normal breath sounds.  Musculoskeletal:        General: Normal range of motion.     Right lower leg: No edema.     Left lower leg: No edema.  Skin:    General: Skin is warm and dry.     Capillary Refill: Capillary refill takes less than 2 seconds.  Neurological:     Mental Status: She is alert and oriented to person, place, and time.  Psychiatric:        Mood and Affect: Mood normal.         Future Appointments  Date Time Provider [place]  04/01/2020  9:45 AM [facility]  04/03/2020  1:40 PM Larey Dresser, MD [facility] None    BP (!) 172/109 (BP Location: Left Arm, Patient Position: Sitting, Cuff Size: Large)   Pulse 100   Resp 16   Wt 197 lb (89.4 kg)   SpO2 97%   BMI 39.79 kg/m   Weight yesterday- 196 lb Last visit weight- 193 lb  Ms Michelotti was seen at home today and reported  feeling generally unwell. She stated she has been under more stress than normal due to her daughter moving in as well as having two friends die recently. She stated she has been avoiding home due to her stress and as a result has missed at least the last two days worth of medicine. She stated she was with her boyfriend when she ran out of medicine and did not have a car to get herself home to pick up her medications. Additionally she canceled her INR check at the [facility] today and rescheduled for next week. Ms Kinnaird has a pattern of self-neglect during times of high stress and it seems that is what is happening again. I stressed the importance of medications compliance and she expressed understanding. Her medications were verified and she refilled her pillbox with my assistance. I will follow up next week.   [name], EMT 03/24/20  ACTION: Home visit completed Next visit planned for 1 week

## 2020-03-31 ENCOUNTER — Other Ambulatory Visit (HOSPITAL_COMMUNITY): Payer: Self-pay | Admitting: Cardiology

## 2020-03-31 MED FILL — SPIRONOLACTONE 25 MG TABS: 25 | 30 days supply | Qty: 30 | Fill #2

## 2020-03-31 MED FILL — DIGOXIN 0.125 MG TABLET: 125 | 30 days supply | Qty: 30 | Fill #3

## 2020-04-01 ENCOUNTER — Other Ambulatory Visit (HOSPITAL_COMMUNITY): Payer: Self-pay | Admitting: Cardiology

## 2020-04-01 ENCOUNTER — Other Ambulatory Visit: Payer: Self-pay

## 2020-04-01 ENCOUNTER — Ambulatory Visit (INDEPENDENT_AMBULATORY_CARE_PROVIDER_SITE_OTHER): Payer: Medicaid Other | Admitting: *Deleted

## 2020-04-01 DIAGNOSIS — Z5181 Encounter for therapeutic drug level monitoring: Secondary | ICD-10-CM

## 2020-04-01 DIAGNOSIS — I480 Paroxysmal atrial fibrillation: Secondary | ICD-10-CM | POA: Diagnosis not present

## 2020-04-01 LAB — POCT INR: INR: 2.3 (ref 2.0–3.0)

## 2020-04-01 MED FILL — ATORVASTATIN 40 MG TABLET: 40 | 30 days supply | Qty: 30 | Fill #0

## 2020-04-01 NOTE — Patient Instructions (Signed)
Description   Continue same dosage 1.5 tablets daily except for 1 tablet on Sundays and Thursdays. Recheck INR in 3 weeks. Call coumadin clinic for any questions [phone].

## 2020-04-03 ENCOUNTER — Other Ambulatory Visit: Payer: Self-pay

## 2020-04-03 ENCOUNTER — Ambulatory Visit (HOSPITAL_COMMUNITY)
Admission: RE | Admit: 2020-04-03 | Discharge: 2020-04-03 | Disposition: A | Discharge status: Home / Self Care | Payer: Medicaid Other | Source: Ambulatory Visit | Attending: Cardiology | Admitting: Cardiology

## 2020-04-03 VITALS — BP 142/92 | HR 98 | Ht 59.0 in | Wt 194.0 lb

## 2020-04-03 DIAGNOSIS — I2510 Atherosclerotic heart disease of native coronary artery without angina pectoris: Secondary | ICD-10-CM | POA: Insufficient documentation

## 2020-04-03 DIAGNOSIS — G4733 Obstructive sleep apnea (adult) (pediatric): Secondary | ICD-10-CM | POA: Insufficient documentation

## 2020-04-03 DIAGNOSIS — Z79899 Other long term (current) drug therapy: Secondary | ICD-10-CM | POA: Insufficient documentation

## 2020-04-03 DIAGNOSIS — Z8249 Family history of ischemic heart disease and other diseases of the circulatory system: Secondary | ICD-10-CM | POA: Diagnosis not present

## 2020-04-03 DIAGNOSIS — I110 Hypertensive heart disease with heart failure: Secondary | ICD-10-CM | POA: Insufficient documentation

## 2020-04-03 DIAGNOSIS — E785 Hyperlipidemia, unspecified: Secondary | ICD-10-CM | POA: Diagnosis not present

## 2020-04-03 DIAGNOSIS — Z794 Long term (current) use of insulin: Secondary | ICD-10-CM | POA: Diagnosis not present

## 2020-04-03 DIAGNOSIS — Z833 Family history of diabetes mellitus: Secondary | ICD-10-CM | POA: Insufficient documentation

## 2020-04-03 DIAGNOSIS — E118 Type 2 diabetes mellitus with unspecified complications: Secondary | ICD-10-CM | POA: Insufficient documentation

## 2020-04-03 DIAGNOSIS — I480 Paroxysmal atrial fibrillation: Secondary | ICD-10-CM | POA: Insufficient documentation

## 2020-04-03 DIAGNOSIS — I428 Other cardiomyopathies: Secondary | ICD-10-CM | POA: Insufficient documentation

## 2020-04-03 DIAGNOSIS — I5022 Chronic systolic (congestive) heart failure: Secondary | ICD-10-CM | POA: Diagnosis not present

## 2020-04-03 DIAGNOSIS — I5023 Acute on chronic systolic (congestive) heart failure: Secondary | ICD-10-CM | POA: Insufficient documentation

## 2020-04-03 DIAGNOSIS — I5020 Unspecified systolic (congestive) heart failure: Secondary | ICD-10-CM

## 2020-04-03 DIAGNOSIS — Z7901 Long term (current) use of anticoagulants: Secondary | ICD-10-CM | POA: Insufficient documentation

## 2020-04-03 DIAGNOSIS — Z886 Allergy status to analgesic agent status: Secondary | ICD-10-CM | POA: Insufficient documentation

## 2020-04-03 DIAGNOSIS — Z86718 Personal history of other venous thrombosis and embolism: Secondary | ICD-10-CM | POA: Diagnosis not present

## 2020-04-03 DIAGNOSIS — E282 Polycystic ovarian syndrome: Secondary | ICD-10-CM | POA: Insufficient documentation

## 2020-04-03 LAB — BASIC METABOLIC PANEL
Anion gap: 12 (ref 5–15)
BUN: 17 mg/dL (ref 6–20)
CO2: 21 mmol/L — ABNORMAL LOW (ref 22–32)
Calcium: 8.7 mg/dL — ABNORMAL LOW (ref 8.9–10.3)
Chloride: 94 mmol/L — ABNORMAL LOW (ref 98–111)
Creatinine, Ser: 1.25 mg/dL — ABNORMAL HIGH (ref 0.44–1.00)
GFR calc Af Amer: >60 mL/min (ref >60)
GFR calc non Af Amer: 53 mL/min — ABNORMAL LOW (ref >60)
Glucose, Bld: 576 mg/dL (ref 70–99)
Potassium: 4.2 mmol/L (ref 3.5–5.1)
Sodium: 127 mmol/L — ABNORMAL LOW (ref 135–145)

## 2020-04-03 LAB — LIPID PANEL
Cholesterol: 161 mg/dL (ref 0–200)
HDL: 37 mg/dL — ABNORMAL LOW (ref >40)
LDL Cholesterol: 87 mg/dL (ref 0–99)
Total CHOL/HDL Ratio: 4.4 RATIO
Triglycerides: 184 mg/dL — ABNORMAL HIGH (ref <150)
VLDL: 37 mg/dL (ref 0–40)

## 2020-04-03 LAB — DIGOXIN LEVEL: Digoxin Level: 0.3 ng/mL — ABNORMAL LOW (ref 0.8–2.0)

## 2020-04-03 MED ORDER — BIDIL 20-37.5 MG PO TABS
0.5000 | ORAL_TABLET | Freq: Three times a day (TID) | ORAL | 3 refills | Status: DC
Start: 2020-04-03 — End: 2020-09-10

## 2020-04-03 MED FILL — BIDIL 20-37.5 MG TABS: 20-37.5 | 30 days supply | Qty: 45 | Fill #0

## 2020-04-03 NOTE — Patient Instructions (Signed)
START Bidil one-half tab three times daily  Labs today We will only contact you if something comes back abnormal or we need to make some changes. Otherwise no news is good news!  Your physician recommends that you schedule a follow-up appointment in: 3 weeks with the chf pharmacy team  Your physician recommends that you schedule a follow-up appointment in: 6 weeks with Dr Aundra Dubin and echo  Your physician has requested that you have an echocardiogram. Echocardiography is a painless test that uses sound waves to create images of your heart. It provides your doctor with information about the size and shape of your heart and how well your heart's chambers and valves are working. This procedure takes approximately one hour. There are no restrictions for this procedure.   If you have any questions or concerns before your next appointment please send [place] a message through [place] or call our office at [phone].    TO LEAVE A MESSAGE FOR THE NURSE SELECT OPTION 2, PLEASE LEAVE A MESSAGE INCLUDING: . YOUR NAME . DATE OF BIRTH . CALL BACK NUMBER . REASON FOR CALL**this is important as we prioritize the call backs  YOU WILL RECEIVE A CALL BACK THE SAME DAY AS LONG AS YOU CALL BEFORE 4:00 PM

## 2020-04-05 NOTE — Progress Notes (Signed)
Advanced Heart Failure Clinic Note   Referring Physician: PCP: Gifford Shave, MD PCP-Cardiologist: Dr. Aundra Dubin  EP- Dr. Curt Bears   HPI: 44 y.o. with history of type 2 diabetes (poorly controlled, last hgbA1c > 15), poorly controlled HTN, hyperlipidemia, paroxysmal atrial fibrillation, and chronic systolic CHF of uncertain etiology.  Patient had an echo in 6/20 as outpatient due to exertional dyspnea. This showed EF 20-25%, moderate RV dysfunction.  She was also noted to have paroxysmal atrial fibrillation.  She was started on Xarelto.  In 7/20, she was admitted with DKA. In 8/20, she was admitted with CHF exacerbation and diuresed.  She was sent home on Lasix 40 mg po bid.   Readmitted on 9/20 with CHF exacerbation, in the setting of elevated BP.  Diuresed with IV lasix.  LHC showed nonobstructive CAD.  RHC showed R>L heart failure with low PAPI suggesting significant RV dysfunction. She was transitioned to PO torsemide and treated w/ guidelines directed medical therapy w/ Entresto, Spironolactone, Coreg and digoxin. She was also transitioned off of Xarelto and placed on Coumadin.  Readmitted 0/0/7121 with A/C systolic heart failure. She had not been taking her lasix because she says it was on her AVS to stop it.  Diuresed with IV lasix and transitioned to torsemide 40 mg daily. Weight day before discharge was 190 pounds. Echo with LV EF <20%, RV severely decreased systolic function.   Echo in 6/21 showed EF 35%, moderately decreased RV systolic function, moderate LAE, mild MR.   She returns for followup of CHF.  Weight is up 3 lbs.  She is short of breath walking a long distance or walking up stairs.  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  She is tired by the end of the day.  She gets fatigued with grocery shopping.  No orthopnea/PND. No chest pain.  No lightheadedness or palpitations.     Labs (5/21): hgbA1c 9.3, BNP 76, K 3.9, creatinine 1.1 Labs (6/21): digoxin 0.4, K 4.1, creatinine  0.77  PMH: 1. Type 2 diabetes: H/o DKA 2. HTN 3. OSA 4. Obesity 5. Polycystic ovarian syndrome 6. Chronic systolic CHF: Nonischemic cardiomyopathy - Echo (6/20) with EF 20-25%, moderate RV dysfunction.  - LHC/RHC (9/20): No significant CAD.  - Echo (3/21): EF <20%, moderate LVH with global HK, severely reduced RV systolic function.  - Echo (6/21): EF 35%, moderately decreased RV systolic function, moderate LAE, mild MR. 7. Hyperlipidemia 8. Atrial fibrillation: Paroxysmal.  9. History of LV thrombus.    Current Outpatient Medications  Medication Sig Dispense Refill  . Accu-Chek Softclix Lancets lancets Use as instructed 100 each 12  . Accu-Chek Softclix Lancets lancets Check cbg 2 times daily 100 each 12  . albuterol (VENTOLIN HFA) 108 (90 Base) MCG/ACT inhaler Inhale 2 puffs into the lungs every 6 (six) hours as needed for wheezing or shortness of breath. 18 g 1  . atorvastatin (LIPITOR) 40 MG tablet TAKE 1 TABLET BY MOUTH DAILY AT 6 PM. 30 tablet 2  . Blood Glucose Monitoring Suppl (ACCU-CHEK AVIVA PLUS) w/Device KIT 1 application by Does not apply route QID. Check fasting sugars as well as throughout the day 1 kit 0  . busPIRone (BUSPAR) 7.5 MG tablet Take 1 tablet (7.5 mg total) by mouth 2 (two) times daily. 180 tablet 0  . carvedilol (COREG) 25 MG tablet Take 1 tablet (25 mg total) by mouth 2 (two) times daily with a meal. 180 tablet 3  . digoxin (LANOXIN) 0.125 MG tablet Take  1 tablet (0.125 mg total) by mouth daily. 30 tablet 3  . empagliflozin (JARDIANCE) 25 MG TABS tablet Take 25 mg by mouth daily. 90 tablet 3  . gabapentin (NEURONTIN) 100 MG capsule Take 100 mg by mouth 2 (two) times daily.    Marland Kitchen glucose blood (ACCU-CHEK AVIVA PLUS) test strip Check CBG 2 times per day 100 each 12  . glucose blood (AGAMATRIX PRESTO TEST) test strip Use as instructed 100 each 12  . hydrOXYzine (ATARAX/VISTARIL) 10 MG tablet Take 1 tablet (10 mg total) by mouth 3 (three) times daily as needed.  30 tablet 0  . insulin glargine (LANTUS) 100 UNIT/ML injection Inject 0.22 mLs (22 Units total) into the skin daily. 10 mL 11  . Lancets Misc. (ACCU-CHEK SOFTCLIX LANCET DEV) KIT 1 application by Does not apply route 2 (two) times daily. 1 kit 0  . meclizine (ANTIVERT) 12.5 MG tablet Take 1 tablet (12.5 mg total) by mouth 3 (three) times daily as needed for dizziness. 30 tablet 0  . methocarbamol (ROBAXIN) 500 MG tablet Take 1 tablet (500 mg total) by mouth every 8 (eight) hours as needed for muscle spasms. 30 tablet 0  . potassium chloride SA (KLOR-CON) 20 MEQ tablet Take 1 tablet by mouth every Monday Wednesday and Friday    . sacubitril-valsartan (ENTRESTO) 97-103 MG Take 1 tablet by mouth 2 (two) times daily. 180 tablet 3  . spironolactone (ALDACTONE) 25 MG tablet Take 1 tablet (25 mg total) by mouth daily. 30 tablet 3  . torsemide (DEMADEX) 20 MG tablet Take 2 tablets (40 mg total) by mouth daily. 60 tablet 3  . traZODone (DESYREL) 50 MG tablet Take 1 tablet (50 mg total) by mouth at bedtime as needed for sleep. 30 tablet 0  . warfarin (COUMADIN) 7.5 MG tablet Take as directed by Coumadin Clinic, Needs OV for future refills 15 tablet 0  . isosorbide-hydrALAZINE (BIDIL) 20-37.5 MG tablet Take 0.5 tablets by mouth 3 (three) times daily. 90 tablet 3   No current facility-administered medications for this encounter.    Allergies  Allergen Reactions  . Shellfish Allergy Swelling    Airway involvement including EMS - Has Epi-Pen  . Dulaglutide Nausea And Vomiting and Other (See Comments)    Multiple episodes of vomiting over multiple days  . Metformin And Related Diarrhea  . Ibuprofen     Per PCP interferes with daily meds       Social History   Socioeconomic History  . Marital status: Single    Spouse name: Not on file  . Number of children: Not on file  . Years of education: Not on file  . Highest education level: Not on file  Occupational History  . Not on file  Tobacco Use    . Smoking status: Never Smoker  . Smokeless tobacco: Never Used  Vaping Use  . Vaping Use: Never used  Substance and Sexual Activity  . Alcohol use: No    Alcohol/week: 0.0 standard drinks  . Drug use: No  . Sexual activity: Yes  Other Topics Concern  . Not on file  Social History Narrative  . Not on file   Social Determinants of Health   Financial Resource Strain: High Risk  . Difficulty of Paying Living Expenses: Hard  Food Insecurity:   . Worried About Charity fundraiser in the Last Year: Not on file  . Ran Out of Food in the Last Year: Not on file  Transportation Needs:   . Lack  of Transportation (Medical): Not on file  . Lack of Transportation (Non-Medical): Not on file  Physical Activity:   . Days of Exercise per Week: Not on file  . Minutes of Exercise per Session: Not on file  Stress:   . Feeling of Stress : Not on file  Social Connections:   . Frequency of Communication with Friends and Family: Not on file  . Frequency of Social Gatherings with Friends and Family: Not on file  . Attends Religious Services: Not on file  . Active Member of Clubs or Organizations: Not on file  . Attends Archivist Meetings: Not on file  . Marital Status: Not on file  Intimate Partner Violence:   . Fear of Current or Ex-Partner: Not on file  . Emotionally Abused: Not on file  . Physically Abused: Not on file  . Sexually Abused: Not on file      Family History  Problem Relation Age of Onset  . Diabetes Mother   . Hypertension Mother   . Hypertension Father   . Diabetes Father     Vitals:   04/03/20 1353  BP: (!) 142/92  Pulse: 98  SpO2: 100%  Weight: 88 kg (194 lb)  Height: 4' 11" (1.499 m)   General: NAD Neck: Thick, no JVD, no thyromegaly or thyroid nodule.  Lungs: Clear to auscultation bilaterally with normal respiratory effort. CV: Nondisplaced PMI.  Heart regular S1/S2, no S3/S4, no murmur.  No peripheral edema.  No carotid bruit.  Normal pedal  pulses.  Abdomen: Soft, nontender, no hepatosplenomegaly, no distention.  Skin: Intact without lesions or rashes.  Neurologic: Alert and oriented x 3.  Psych: Normal affect. Extremities: No clubbing or cyanosis.  HEENT: Normal.   ASSESSMENT & PLAN:  1. Acute on chronic systolic CHF:  Nonischemic cardiomyopathy, diagnosed in 6/20. Repeat Echo 03/2019 showed EF 20-25%, diffuse hypokinesis, LV thrombus, moderate RV systolic dysfunction, mild MR, moderate TR. Echo in 3/21 with EF <30%, severe RV dysfunction. She has a history of paroxysmal atrial fibrillation but seems to be mostly in sinus rhythm. This is not likely to be a tachycardia-mediated cardiomyopathy. Poorly controlled diabetes itself can cause a cardiomyopathy, as can poorly controlled HTN. Myocarditis is a potential cause of her cardiomyopathy. [place] 9/20 showed nonobstructive CAD. RHC showed R>L heart failure with low PAPI suggesting significant RV dysfunction.  Echo in 6/21 with EF up to 35%. NYHA class II symptoms.  She is now taking all her medications with the help of paramedicine.  She is not volume overloaded. BP mildly elevated.  - Continue Entresto 97/103 bid.  - Continue torsemide 40 mg daily.   BMET today.  - Continue spironolactone 25 mg daily.  - Continue Coreg 25 mg bid.  - Continue digoxin. Check dig level today  - Continue empagliflozin.  - Start Bidil 1/2 tab tid.  - Continue w/ paramedicine. Appreciate their assistance  - EF 35% on last echo, repeat echo in 6 wks to see if EF continues to improve.  If not, she will need an ICD.  - She previously had been trying to get pregnant (has been difficult with polycystic ovarian syndrome). She has 2 grown children already.With her cardiomyopathy and active CHF, pregnancy would create a significant mortality risk for her. A number of her cardiac meds to control her CHF are feto-toxic. She understands this risk and is no longer trying to get pregnant. Knows importance of using  contraception to avoid pregnancy.  2. LV thrombus: LV thrombus  noted on echo 9/20.  - Compliant w/ coumadin.  3. Atrial fibrillation: Paroxysmal. NSR today.  - Continue warfarin (using warfarin rather than DOAC with h/o LV thrombus).  INRs followed by PCP - Would consider atrial fibrillation ablation in the future, if recurrence, given CASTLE-HF data.  4. Type 2 diabetes: per PCP.   5. Hypertension: Adding Bidil today.   6. LV thrombus: She is on warfarin.   Followup in 3 wks with HF pharmacist, see me in 6 wks with echo.   Loralie Champagne, MD 04/05/20

## 2020-04-07 ENCOUNTER — Other Ambulatory Visit: Payer: Self-pay

## 2020-04-07 NOTE — Telephone Encounter (Signed)
Patient calls nurse line requesting medication refills on trazodone and medication for yeast infection. Advised patient that typically office visit is needed for rx for yeast infection. Patient declined, stating that provider is aware of her history.   To PCP   Please advise  Talbot Grumbling, RN

## 2020-04-09 ENCOUNTER — Other Ambulatory Visit: Payer: Self-pay | Admitting: Family Medicine

## 2020-04-09 MED ORDER — TRAZODONE HCL 50 MG PO TABS: 50.0000 mg | ORAL_TABLET | Freq: Every evening | ORAL | 0 refills | Status: DC | PRN

## 2020-04-09 MED FILL — traZODone HCL 50 MG TABS: 50 | 30 days supply | Qty: 30 | Fill #0

## 2020-04-17 NOTE — Addendum Note (Signed)
Encounter addended by: Harvie Junior, CMA on: 04/17/2020 9:48 AM  Actions taken: Charge Capture section accepted

## 2020-04-22 ENCOUNTER — Ambulatory Visit (INDEPENDENT_AMBULATORY_CARE_PROVIDER_SITE_OTHER): Payer: Medicaid Other | Admitting: Pharmacist

## 2020-04-22 ENCOUNTER — Other Ambulatory Visit: Payer: Self-pay

## 2020-04-22 DIAGNOSIS — Z5181 Encounter for therapeutic drug level monitoring: Secondary | ICD-10-CM | POA: Diagnosis not present

## 2020-04-22 DIAGNOSIS — I480 Paroxysmal atrial fibrillation: Secondary | ICD-10-CM | POA: Diagnosis not present

## 2020-04-22 LAB — POCT INR: INR: 1.4 — AB (ref 2.0–3.0)

## 2020-04-22 MED ORDER — WARFARIN SODIUM 7.5 MG PO TABS: ORAL_TABLET | ORAL | 0 refills | Status: DC

## 2020-04-22 MED FILL — WARFARIN SODIUM 7.5 MG TAB: 7.5 | 30 days supply | Qty: 45 | Fill #0

## 2020-04-22 NOTE — Patient Instructions (Signed)
Take 2 tablets today and 1.5 tablets tomorrow then resume the dose you were supposed to be on: 1.5 tablets daily except for 1 tablet on Sundays and Thursdays. Recheck INR in 1 weeks. Call coumadin clinic for any questions [phone].

## 2020-04-23 ENCOUNTER — Telehealth (HOSPITAL_COMMUNITY): Payer: Self-pay

## 2020-04-23 NOTE — Telephone Encounter (Signed)
I called MS [place] to see if she was available for an appointment today r tomorrow. She did not answer so I left a message requesting she call me back.   [name], EMT 04/23/20

## 2020-04-29 ENCOUNTER — Other Ambulatory Visit: Payer: Self-pay

## 2020-04-29 ENCOUNTER — Ambulatory Visit (HOSPITAL_COMMUNITY)
Admission: RE | Admit: 2020-04-29 | Discharge: 2020-04-29 | Disposition: A | Discharge status: Home / Self Care | Payer: Medicaid Other | Source: Ambulatory Visit | Attending: Internal Medicine | Admitting: Internal Medicine

## 2020-04-29 ENCOUNTER — Ambulatory Visit (INDEPENDENT_AMBULATORY_CARE_PROVIDER_SITE_OTHER): Payer: Medicaid Other

## 2020-04-29 VITALS — BP 148/84 | HR 70 | Wt 198.8 lb

## 2020-04-29 DIAGNOSIS — Z5181 Encounter for therapeutic drug level monitoring: Secondary | ICD-10-CM

## 2020-04-29 DIAGNOSIS — Z86718 Personal history of other venous thrombosis and embolism: Secondary | ICD-10-CM | POA: Diagnosis not present

## 2020-04-29 DIAGNOSIS — I428 Other cardiomyopathies: Secondary | ICD-10-CM | POA: Insufficient documentation

## 2020-04-29 DIAGNOSIS — R519 Headache, unspecified: Secondary | ICD-10-CM | POA: Insufficient documentation

## 2020-04-29 DIAGNOSIS — E785 Hyperlipidemia, unspecified: Secondary | ICD-10-CM | POA: Insufficient documentation

## 2020-04-29 DIAGNOSIS — Z7901 Long term (current) use of anticoagulants: Secondary | ICD-10-CM | POA: Diagnosis not present

## 2020-04-29 DIAGNOSIS — I110 Hypertensive heart disease with heart failure: Secondary | ICD-10-CM | POA: Insufficient documentation

## 2020-04-29 DIAGNOSIS — R079 Chest pain, unspecified: Secondary | ICD-10-CM | POA: Diagnosis not present

## 2020-04-29 DIAGNOSIS — R0602 Shortness of breath: Secondary | ICD-10-CM | POA: Insufficient documentation

## 2020-04-29 DIAGNOSIS — I5022 Chronic systolic (congestive) heart failure: Secondary | ICD-10-CM

## 2020-04-29 DIAGNOSIS — I480 Paroxysmal atrial fibrillation: Secondary | ICD-10-CM | POA: Diagnosis not present

## 2020-04-29 DIAGNOSIS — E118 Type 2 diabetes mellitus with unspecified complications: Secondary | ICD-10-CM | POA: Diagnosis not present

## 2020-04-29 DIAGNOSIS — Z79899 Other long term (current) drug therapy: Secondary | ICD-10-CM | POA: Diagnosis not present

## 2020-04-29 LAB — POCT INR: INR: 4 — AB (ref 2.0–3.0)

## 2020-04-29 NOTE — Patient Instructions (Signed)
Description   Skip today's dosage of Warfarin, then resume same dosage 1.5 tablets daily except for 1 tablet on Sundays and Thursdays. Recheck INR in 2 weeks. Call coumadin clinic for any questions [phone].

## 2020-04-29 NOTE — Progress Notes (Signed)
Referring Physician: PCP: Gifford Shave, MD PCP-Cardiologist: Dr. Aundra Dubin  EP- Dr. Curt Bears   HPI:  44 y.o.with history of type 2 diabetes (poorly controlled, last hgbA1c >15), poorly controlled HTN, hyperlipidemia, paroxysmal atrial fibrillation, and chronic systolic CHF of uncertain etiology. Patient had an echo in 6/20 as outpatient due to exertional dyspnea. This showed EF 20-25%, moderate RV dysfunction. She was also noted to have paroxysmal atrial fibrillation. She was started on Xarelto. In 7/20, she was admitted with DKA. In 8/20, she was admitted with CHF exacerbation and diuresed. She was sent home on furosemide 40 mg po bid.   Readmitted on 9/20 with CHF exacerbation, in the setting of elevated BP. Diuresed with IV furosemide.  LHC showed nonobstructive CAD. RHC showed R>L heart failure with low PAPI suggesting significant RV dysfunction. She was transitioned to PO torsemide and treated w/ guideline directed medical therapy w/ Entresto, Spironolactone, carvedilol and digoxin. She was also transitioned off of Xarelto and placed on warfarin.  Readmitted 03/30/1883 with A/C systolic heart failure. She had not been taking her furosemide because she says it was on her AVS to stop it.  Diuresed with IV furosemide and transitioned to torsemide 40 mg daily. Weight day before discharge was 190 pounds. Echo with LV EF <20%, RV severely decreased systolic function.   Echo in 6/21 showed EF 35%, moderately decreased RV systolic function, moderate LAE, mild MR.   Recently returned to HF Clinic for follow up on 04/03/2020. Weight was up 3 lbs.  She was short of breath walking a long distance or walking up stairs.  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  She reported getting tired by the end of the day.  She reported getting fatigued with grocery shopping.  No orthopnea/PND. No chest pain.  No lightheadedness or palpitations.    Today she returns to HF clinic for pharmacist medication  titration. At last visit with MD, BiDil 1/2 tab TID was initiated. Patient reported headaches with BiDil and reports she uses acetaminophen at each dose which sometimes relieves her headache. Overall, patient is not feeling very well, she reports being extremely mentally fatigued with increased anxiety. She is stressed about her work. 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 Patient has trouble sleeping without trazodone and wakes up with anxious thoughts. Patient reported chest pain that was mild but pressure has increased in the past couple weeks. She attributes this to anxiety. No dizziness or lightheadedness. She reports getting SOB when she has been "going constantly" for days at a time. She took off of work on Friday, 04/24/20, because she was feeling like her body needed a break. She wakes up feeling SOB at times but attributed most of this to her anxiety. Patient now sleeps on 3 pillows (previously used 1) which she largely attributed to increased anxiety as well. No LEE. Does not appear volume overloaded on exam. Weight at home is usually around 192 lbs. She was menstruating at time of appointment and said she always is heavier around this time. Takes torsemide 40 mg daily and has not needed any extra. Her appetite is poor when she has a bad day. She reported not eating at all, but hydrating, 2 out of 7 days in the past week. Advised she notify PCP as he manages T2DM and patient is using insulin. Patient also tries to limit salt by using Mrs. Dash. No trouble with affording medications.   HF Medications: Carvedilol 25 mg BID  Entresto 97/103 mg  BID Spironolactone 25 mg daily  BiDil 20-37.5 0.5 tabs TID Empagliflozin 25 mg daily  Torsemide 40 mg daily  Has the patient been experiencing any side effects to the medications prescribed?  YES, headaches with BiDil  Does the patient have any problems obtaining medications due to transportation or finances? NO, has [facility]  Medicaid  Understanding of regimen: Excellent Understanding of indications: Good Potential of compliance: Fair Patient understands to avoid NSAIDs. Patient understands to avoid decongestants.    Pertinent Lab Values 04/03/20: . Scr 1.25, BUN 17, Potassium 4.2, Digoxin 0.3 ng/mL  Vital Signs: . Weight: 198 lbs (last clinic weight: 194 lbs) . Blood pressure: 148/84 (w/o AM medications) . Heart rate: 70 bpm   Assessment: 1. Acute on chronic systolic CHF: Nonischemic cardiomyopathy, diagnosed in 6/20. Repeat Echo 03/2019 showed EF 20-25%, diffuse hypokinesis, LV thrombus, moderate RV systolic dysfunction, mild MR, moderate TR. Echo in 3/21 with EF <30%, severe RV dysfunction. She has a history of paroxysmal atrial fibrillation but seems to be mostly in sinus rhythm. This is not likely to be a tachycardia-mediated cardiomyopathy. Poorly controlled diabetes itself can cause a cardiomyopathy, as can poorly controlled HTN. Myocarditis is a potential cause of her cardiomyopathy. [place] 9/20 showed nonobstructive CAD. RHC showed R>L heart failure with low PAPI suggesting significant RV dysfunction.  Echo in 6/21 with EF up to 35%.  - NYHA class II symptoms. She is not volume overloaded on exam.  - Vitals: BP 148/84, HR 70 - did not take any medications yet this morning.  - Continue torsemide 40 mg daily  - Continue carvedilol 25 mg BID - Continue Entresto 97/103 BID.     - Continue spironolactone 25 mg daily.  - Continue digoxin 0.125 mg daily  - Continue empagliflozin 25 mg daily - Continue Bidil 1/2 tab TID. Will not increase today given complaints of headaches. Encouraged her to take acetaminophen before doses to see if this helps her headaches. Will try to increase at next visit if headaches have improved.   - Continue with paramedicine. Appreciate their assistance  - EF 35% on last echo, repeat echo to see if EF continues to improve.  If not, she will need an ICD.  - She previously had been  trying to get pregnant (has been difficult with polycystic ovarian syndrome). She has 2 grown children already.With her cardiomyopathy and active CHF, pregnancy would create a significant mortality risk for her. A number of her cardiac meds to control her CHF are feto-toxic. She understands this risk and is no longer trying to get pregnant. Knows importance of using contraception to avoid pregnancy.  2. 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  - Compliant w/ warfarin.  3. Atrial fibrillation: Paroxysmal. NSR today.  -Continue warfarin (using warfarin rather than DOAC with h/o LV thrombus). INRs followed by PCP - Would consider atrial fibrillation ablation in the future, if recurrence, given CASTLE-HF data.  4. Type 2 diabetes: per PCP.   5. Hypertension: BP elevated at 148/84 but patient did not take medications prior to appointment,  -continuing BiDiI, Entresto, carvedilol, spironolactone.   6. LV thrombus: She is on warfarin.    Plan: 1) Medication changes: Based on clinical presentation, vital signs and recent labs, will not make any medication changes today given headaches with Bidil and that she had not taken her morning BP medications. Encouraged her to contact her PCP regarding her diabetes and to let us know if she needs help identifying someone to talk to regarding  her depression/anxiety.  2) Follow-up in 3 weeks with Dr. Rush Farmer, PharmD, BCPS, [facility], [place] [facility] Pharmacist [phone]

## 2020-04-29 NOTE — Patient Instructions (Addendum)
It was a pleasure seeing you today!  MEDICATIONS: -No medication changes today -Call if you have questions about your medications.   NEXT APPOINTMENT: Return to clinic in 4 weeks with Dr. Aundra Dubin.  In general, to take care of your heart failure: -Limit your fluid intake to 2 Liters (half-gallon) per day.   -Limit your salt intake to ideally 2-3 grams (2000-3000 mg) per day. -Weigh yourself daily and record, and bring that "weight diary" to your next appointment.  (Weight gain of 2-3 pounds in 1 day typically means fluid weight.) -The medications for your heart are to help your heart and help you live longer.   -Please contact us before stopping any of your heart medications.  Call the clinic at [phone] with questions or to reschedule future appointments.

## 2020-04-30 ENCOUNTER — Other Ambulatory Visit (HOSPITAL_COMMUNITY): Payer: Self-pay

## 2020-04-30 NOTE — Progress Notes (Signed)
Paramedicine Encounter    Patient ID: [name], female    DOB: Nov 20, 1975, 44 y.o.   MRN: [id number]   Patient Care Team: Gifford Shave, MD as PCP - General (Family Medicine) Larey Dresser, MD as PCP - Cardiology (Cardiology) Constance Haw, MD as PCP - Electrophysiology (Cardiology) [name], LCSW as Social Worker (Licensed Clinical Social Worker)  Patient Active Problem List   Diagnosis Date Noted  . Hypokalemia 10/13/2019  . Hyponatremia 10/13/2019  . Encounter for therapeutic drug monitoring 10/07/2019  . Acute on chronic congestive heart failure ([place])   . Hypertensive urgency 09/30/2019  . Elevated troponin 09/30/2019  . Hypertensive emergency 09/30/2019  . Attempting to conceive 03/10/2019  . Acute on chronic systolic CHF (congestive heart failure) ([place]) 02/19/2019  . Asthma   . Yeast infection 02/06/2019  . Right ventricular failure ([place]) 02/06/2019  . Morbid obesity ([place]) 02/06/2019  . HFrEF (heart failure with reduced ejection fraction) ([place]) 12/31/2018  . Paroxysmal atrial fibrillation ([place]) 12/31/2018  . Anxiety 12/31/2018  . Hyperlipidemia associated with type 2 diabetes mellitus ([place]) 10/30/2017  . Obstructive sleep apnea 06/30/2015  . Anemia due to blood loss, chronic 06/27/2012  . Allergic rhinitis 06/27/2012  . Major depressive disorder 02/08/2012  . Hypertension associated with diabetes ([place]) 12/04/2011  . Dysmenorrhea 11/22/2011  . Vaginal yeast infection 05/19/2011  . DM2 (diabetes mellitus, type 2) ([place]) 05/19/2011  . PCOS (polycystic ovarian syndrome) 05/19/2011  . Hydrosalpinx 08/21/2009    Current Outpatient Medications:  .  Accu-Chek Softclix Lancets lancets, Use as instructed, Disp: 100 each, Rfl: 12 .  Accu-Chek Softclix Lancets lancets, Check cbg 2 times daily, Disp: 100 each, Rfl: 12 .  albuterol (VENTOLIN HFA) 108 (90 Base) MCG/ACT inhaler, Inhale 2 puffs into the lungs every 6 (six) hours as needed for wheezing or  shortness of breath., Disp: 18 g, Rfl: 1 .  atorvastatin (LIPITOR) 40 MG tablet, TAKE 1 TABLET BY MOUTH DAILY AT 6 PM., Disp: 30 tablet, Rfl: 2 .  Blood Glucose Monitoring Suppl (ACCU-CHEK AVIVA PLUS) w/Device KIT, 1 application by Does not apply route QID. Check fasting sugars as well as throughout the day, Disp: 1 kit, Rfl: 0 .  busPIRone (BUSPAR) 7.5 MG tablet, Take 1 tablet (7.5 mg total) by mouth 2 (two) times daily., Disp: 180 tablet, Rfl: 0 .  carvedilol (COREG) 25 MG tablet, Take 1 tablet (25 mg total) by mouth 2 (two) times daily with a meal., Disp: 180 tablet, Rfl: 3 .  digoxin (LANOXIN) 0.125 MG tablet, Take 1 tablet (0.125 mg total) by mouth daily., Disp: 30 tablet, Rfl: 3 .  empagliflozin (JARDIANCE) 25 MG TABS tablet, Take 25 mg by mouth daily., Disp: 90 tablet, Rfl: 3 .  gabapentin (NEURONTIN) 100 MG capsule, Take 100 mg by mouth 2 (two) times daily., Disp: , Rfl:  .  glucose blood (ACCU-CHEK AVIVA PLUS) test strip, Check CBG 2 times per day, Disp: 100 each, Rfl: 12 .  glucose blood (AGAMATRIX PRESTO TEST) test strip, Use as instructed, Disp: 100 each, Rfl: 12 .  hydrOXYzine (ATARAX/VISTARIL) 10 MG tablet, Take 1 tablet (10 mg total) by mouth 3 (three) times daily as needed., Disp: 30 tablet, Rfl: 0 .  insulin glargine (LANTUS) 100 UNIT/ML injection, Inject 0.22 mLs (22 Units total) into the skin daily., Disp: 10 mL, Rfl: 11 .  isosorbide-hydrALAZINE (BIDIL) 20-37.5 MG tablet, Take 0.5 tablets by mouth 3 (three) times daily., Disp: 90 tablet, Rfl: 3 .  Lancets Misc. (  ACCU-CHEK SOFTCLIX LANCET DEV) KIT, 1 application by Does not apply route 2 (two) times daily., Disp: 1 kit, Rfl: 0 .  meclizine (ANTIVERT) 12.5 MG tablet, Take 1 tablet (12.5 mg total) by mouth 3 (three) times daily as needed for dizziness., Disp: 30 tablet, Rfl: 0 .  methocarbamol (ROBAXIN) 500 MG tablet, Take 1 tablet (500 mg total) by mouth every 8 (eight) hours as needed for muscle spasms., Disp: 30 tablet, Rfl: 0 .   potassium chloride SA (KLOR-CON) 20 MEQ tablet, Take 1 tablet by mouth every Monday Wednesday and Friday, Disp: , Rfl:  .  sacubitril-valsartan (ENTRESTO) 97-103 MG, Take 1 tablet by mouth 2 (two) times daily., Disp: 180 tablet, Rfl: 3 .  spironolactone (ALDACTONE) 25 MG tablet, Take 1 tablet (25 mg total) by mouth daily., Disp: 30 tablet, Rfl: 3 .  torsemide (DEMADEX) 20 MG tablet, Take 2 tablets (40 mg total) by mouth daily., Disp: 60 tablet, Rfl: 3 .  traZODone (DESYREL) 50 MG tablet, Take 1 tablet (50 mg total) by mouth at bedtime as needed for sleep., Disp: 30 tablet, Rfl: 0 .  warfarin (COUMADIN) 7.5 MG tablet, Take as directed by Coumadin Clinic, Needs OV for future refills, Disp: 45 tablet, Rfl: 0 Allergies  Allergen Reactions  . Shellfish Allergy Swelling    Airway involvement including EMS - Has Epi-Pen  . Dulaglutide Nausea And Vomiting and Other (See Comments)    Multiple episodes of vomiting over multiple days  . Metformin And Related Diarrhea  . Ibuprofen     Per PCP interferes with daily meds       Social History   Socioeconomic History  . Marital status: Single    Spouse name: Not on file  . Number of children: Not on file  . Years of education: Not on file  . Highest education level: Not on file  Occupational History  . Not on file  Tobacco Use  . Smoking status: Never Smoker  . Smokeless tobacco: Never Used  Vaping Use  . Vaping Use: Never used  Substance and Sexual Activity  . Alcohol use: No    Alcohol/week: 0.0 standard drinks  . Drug use: No  . Sexual activity: Yes  Other Topics Concern  . Not on file  Social History Narrative  . Not on file   Social Determinants of Health   Financial Resource Strain: High Risk  . Difficulty of Paying Living Expenses: Hard  Food Insecurity:   . Worried About Charity fundraiser in the Last Year: Not on file  . Ran Out of Food in the Last Year: Not on file  Transportation Needs:   . Lack of Transportation  (Medical): Not on file  . Lack of Transportation (Non-Medical): Not on file  Physical Activity:   . Days of Exercise per Week: Not on file  . Minutes of Exercise per Session: Not on file  Stress:   . Feeling of Stress : Not on file  Social Connections:   . Frequency of Communication with Friends and Family: Not on file  . Frequency of Social Gatherings with Friends and Family: Not on file  . Attends Religious Services: Not on file  . Active Member of Clubs or Organizations: Not on file  . Attends Archivist Meetings: Not on file  . Marital Status: Not on file  Intimate Partner Violence:   . Fear of Current or Ex-Partner: Not on file  . Emotionally Abused: Not on file  . Physically Abused:  Not on file  . Sexually Abused: Not on file    Physical Exam Cardiovascular:     Rate and Rhythm: Regular rhythm. Tachycardia present.     Pulses: Normal pulses.  Pulmonary:     Effort: Pulmonary effort is normal.     Breath sounds: Normal breath sounds.  Musculoskeletal:        General: Normal range of motion.     Right lower leg: No edema.     Left lower leg: No edema.  Skin:    General: Skin is warm and dry.     Capillary Refill: Capillary refill takes less than 2 seconds.  Neurological:     Mental Status: She is alert and oriented to person, place, and time.  Psychiatric:        Mood and Affect: Mood normal.         Future Appointments  Date Time Provider [place]  05/13/2020  9:45 AM [facility]  05/26/2020 11:00 AM [facility] E[facility] OP 1 [facility]  05/26/2020 12:00 PM Larey Dresser, MD [facility] None    BP (!) 143/100 (BP Location: Left Arm, Patient Position: Sitting, Cuff Size: Large)   Pulse 98   Resp 16   Wt 198 lb (89.8 kg)   SpO2 100%   BMI 39.99 kg/m   Weight yesterday- 198 lb Last visit weight- 198  [name] was seen at home today and reported feeling well with the exception of "mental fatigue." She  stated she has been feeling overwhelmed by family matters and her health. I spoke at length about mental health and the benefits of counseling and she was eventually agreeable to see a mental health counselor. I will provide a list of counselors and have her look through it and call those in which she has interest. Her medications were verified and her pillbox was refilled. I will follow up next week.   [name], EMT 04/30/20  ACTION: Home visit completed Next visit planned for 1 week

## 2020-05-05 ENCOUNTER — Telehealth (HOSPITAL_COMMUNITY): Payer: Self-pay | Admitting: Licensed Clinical Social Worker

## 2020-05-05 MED FILL — DIGOXIN 0.125 MG TABLET: 125 | 30 days supply | Qty: 30 | Fill #0

## 2020-05-05 MED FILL — CARVEDILOL 25 MG TABLET: 25 | 30 days supply | Qty: 60 | Fill #1

## 2020-05-05 MED FILL — SPIRONOLACTONE 25 MG TABS: 25 | 30 days supply | Qty: 30 | Fill #3

## 2020-05-05 MED FILL — ATORVASTATIN 40 MG TABLET: 40 | 30 days supply | Qty: 30 | Fill #1

## 2020-05-05 NOTE — Telephone Encounter (Signed)
CSW called pt to check in regarding depression she had reported to [employer].  Pt states she is still feeling down and associates this mainly with her inability to work at this time and feeling trapped without a car.  Paramedic planning to bring by list of local therapist- pt feels comfortable calling them to get appt.  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  Is now watching her grandchildren during the day so her daughter is able to work.  Pt just started getting disability this month so she is hopeful this will help her.  Pt states they have been struggling to pay for electric bill and that she is about $800 past due but has not received a cut off notice.  CSW sent pt link to [employer] program since their income has been affected by COVID as well as information on how to apply to [employer] assistance- encouraged pt to call me with any questions or if they receive cut off notice from [employer].  Will continue to follow and assist as needed  [name], [facility] Desk#: [phone] Cell#: [phone]

## 2020-05-11 ENCOUNTER — Telehealth (HOSPITAL_COMMUNITY): Payer: Self-pay | Admitting: Licensed Clinical Social Worker

## 2020-05-11 NOTE — Telephone Encounter (Signed)
CSW called to check if pt received therapist options CSW emailed her last week.  Pt reports she didn't receive that list so CSW resent and confirmed correct email address.  CSW had also been informed about 10 week study at [facility] for those with depression/anxiety and discussed with pt- pt is agreeable to CSW sending in referral.  Referral sent through [facility]  Will continue to follow and assist as needed  [name], [facility] Desk#: [phone] Cell#: [phone]

## 2020-05-13 ENCOUNTER — Other Ambulatory Visit: Payer: Self-pay

## 2020-05-13 ENCOUNTER — Ambulatory Visit (INDEPENDENT_AMBULATORY_CARE_PROVIDER_SITE_OTHER): Payer: Medicaid Other | Admitting: *Deleted

## 2020-05-13 DIAGNOSIS — I480 Paroxysmal atrial fibrillation: Secondary | ICD-10-CM | POA: Diagnosis not present

## 2020-05-13 DIAGNOSIS — Z5181 Encounter for therapeutic drug level monitoring: Secondary | ICD-10-CM

## 2020-05-13 LAB — POCT INR: INR: 1.6 — AB (ref 2.0–3.0)

## 2020-05-13 NOTE — Patient Instructions (Addendum)
Description   Today take 2 tablets then continue Warfarin 1.5 tablets daily except for 1 tablet on Sundays and Thursdays. Recheck INR in 2 weeks. Call coumadin clinic for any questions [phone].   Please Bill Code [id number].

## 2020-05-14 ENCOUNTER — Telehealth (HOSPITAL_COMMUNITY): Payer: Self-pay

## 2020-05-14 NOTE — Telephone Encounter (Signed)
I called Ms Wiederhold to see when she would be available for an appointment. She did not answer so I left a message requesting she call me back.   [name], EMT 05/14/20

## 2020-05-15 ENCOUNTER — Telehealth (HOSPITAL_COMMUNITY): Payer: Self-pay

## 2020-05-15 NOTE — Telephone Encounter (Signed)
I called [name] to schedule an appointment. She did not answer so I left a message requesting she call me back.   [name], EMT 05/15/20

## 2020-05-15 NOTE — Telephone Encounter (Signed)
Mr Weisel returned my phone call from earlier today and advised she was doing well. She stated she has been busy and did not need a visit today. She stated her daughter had refilled her pillbox and she has been taking her medications as prescribed. We discussed our next appointment and she agreed to meet me next Thursday at 11:00.  [name], EMT 05/15/20

## 2020-05-21 ENCOUNTER — Emergency Department (HOSPITAL_COMMUNITY)
Admission: EM | Admit: 2020-05-21 | Discharge: 2020-05-21 | Disposition: A | Discharge status: Home / Self Care | Payer: Medicaid Other | Attending: Emergency Medicine | Admitting: Emergency Medicine

## 2020-05-21 ENCOUNTER — Emergency Department (HOSPITAL_COMMUNITY): Payer: Medicaid Other

## 2020-05-21 ENCOUNTER — Telehealth (HOSPITAL_COMMUNITY): Payer: Self-pay | Admitting: *Deleted

## 2020-05-21 ENCOUNTER — Other Ambulatory Visit (HOSPITAL_COMMUNITY): Payer: Self-pay

## 2020-05-21 DIAGNOSIS — R079 Chest pain, unspecified: Secondary | ICD-10-CM

## 2020-05-21 DIAGNOSIS — Z7901 Long term (current) use of anticoagulants: Secondary | ICD-10-CM | POA: Diagnosis not present

## 2020-05-21 DIAGNOSIS — E119 Type 2 diabetes mellitus without complications: Secondary | ICD-10-CM | POA: Insufficient documentation

## 2020-05-21 DIAGNOSIS — R072 Precordial pain: Secondary | ICD-10-CM | POA: Insufficient documentation

## 2020-05-21 DIAGNOSIS — R0789 Other chest pain: Secondary | ICD-10-CM | POA: Diagnosis not present

## 2020-05-21 DIAGNOSIS — Z79899 Other long term (current) drug therapy: Secondary | ICD-10-CM | POA: Insufficient documentation

## 2020-05-21 DIAGNOSIS — I517 Cardiomegaly: Secondary | ICD-10-CM | POA: Diagnosis not present

## 2020-05-21 DIAGNOSIS — I110 Hypertensive heart disease with heart failure: Secondary | ICD-10-CM | POA: Insufficient documentation

## 2020-05-21 DIAGNOSIS — I5043 Acute on chronic combined systolic (congestive) and diastolic (congestive) heart failure: Secondary | ICD-10-CM | POA: Insufficient documentation

## 2020-05-21 DIAGNOSIS — J45909 Unspecified asthma, uncomplicated: Secondary | ICD-10-CM | POA: Diagnosis not present

## 2020-05-21 DIAGNOSIS — Z794 Long term (current) use of insulin: Secondary | ICD-10-CM | POA: Insufficient documentation

## 2020-05-21 LAB — COMPREHENSIVE METABOLIC PANEL
ALT: 20 U/L (ref 0–44)
AST: 26 U/L (ref 15–41)
Albumin: 3.1 g/dL — ABNORMAL LOW (ref 3.5–5.0)
Alkaline Phosphatase: 126 U/L (ref 38–126)
Anion gap: 12 (ref 5–15)
BUN: 10 mg/dL (ref 6–20)
CO2: 22 mmol/L (ref 22–32)
Calcium: 9 mg/dL (ref 8.9–10.3)
Chloride: 101 mmol/L (ref 98–111)
Creatinine, Ser: 0.91 mg/dL (ref 0.44–1.00)
GFR, Estimated: >60 mL/min (ref >60)
Glucose, Bld: 308 mg/dL — ABNORMAL HIGH (ref 70–99)
Potassium: 4.1 mmol/L (ref 3.5–5.1)
Sodium: 135 mmol/L (ref 135–145)
Total Bilirubin: 0.8 mg/dL (ref 0.3–1.2)
Total Protein: 7.7 g/dL (ref 6.5–8.1)

## 2020-05-21 LAB — PROTIME-INR
INR: 1.6 — ABNORMAL HIGH (ref 0.8–1.2)
Prothrombin Time: 18.2 seconds — ABNORMAL HIGH (ref 11.4–15.2)

## 2020-05-21 LAB — CBC
HCT: 38.3 % (ref 36.0–46.0)
Hemoglobin: 11.6 g/dL — ABNORMAL LOW (ref 12.0–15.0)
MCH: 24.1 pg — ABNORMAL LOW (ref 26.0–34.0)
MCHC: 30.3 g/dL (ref 30.0–36.0)
MCV: 79.6 fL — ABNORMAL LOW (ref 80.0–100.0)
Platelets: 391 10*3/uL (ref 150–400)
RBC: 4.81 MIL/uL (ref 3.87–5.11)
RDW: 15.1 % (ref 11.5–15.5)
WBC: 11.3 10*3/uL — ABNORMAL HIGH (ref 4.0–10.5)
nRBC: 0 % (ref 0.0–0.2)

## 2020-05-21 LAB — URINALYSIS, ROUTINE W REFLEX MICROSCOPIC
Bacteria, UA: NONE SEEN
Bilirubin Urine: NEGATIVE
Glucose, UA: >=500 mg/dL — AB
Hgb urine dipstick: NEGATIVE
Ketones, ur: NEGATIVE mg/dL
Leukocytes,Ua: NEGATIVE
Nitrite: NEGATIVE
Protein, ur: NEGATIVE mg/dL
Specific Gravity, Urine: 1.038 — ABNORMAL HIGH (ref 1.005–1.030)
pH: 6 (ref 5.0–8.0)

## 2020-05-21 LAB — TROPONIN I (HIGH SENSITIVITY)
Troponin I (High Sensitivity): 6 ng/L (ref <18)
Troponin I (High Sensitivity): 6 ng/L (ref <18)

## 2020-05-21 LAB — LIPASE, BLOOD: Lipase: 56 U/L — ABNORMAL HIGH (ref 11–51)

## 2020-05-21 LAB — I-STAT BETA HCG BLOOD, ED (MC, WL, AP ONLY): I-stat hCG, quantitative: <5 m[IU]/mL (ref <5)

## 2020-05-21 LAB — D-DIMER, QUANTITATIVE: D-Dimer, Quant: <0.27 ug/mL-FEU (ref 0.00–0.50)

## 2020-05-21 LAB — BRAIN NATRIURETIC PEPTIDE: B Natriuretic Peptide: 25.1 pg/mL (ref 0.0–100.0)

## 2020-05-21 MED ORDER — FENTANYL CITRATE (PF) 100 MCG/2ML IJ SOLN
75.0000 ug | Freq: Once | INTRAMUSCULAR | Status: AC
  Administered 2020-05-21: 75 ug via INTRAVENOUS
  Filled 2020-05-21: qty 2

## 2020-05-21 NOTE — Discharge Instructions (Addendum)
Cardiology was contacted today regarding your symptoms, your work up today has been re-assuring and they will call to get you a follow up appointment in the next week! Hope you feel better soon!

## 2020-05-21 NOTE — Telephone Encounter (Signed)
[name] with paramedicine called to report he is out seeing pt and she is complaining of 7/10 CP. She stated the pain started Monday evening and has continued to get worse. Advised pt to report to ER for further eval, he will advise pt

## 2020-05-21 NOTE — ED Notes (Signed)
Patient verbalizes understanding of discharge instructions. Opportunity for questioning and answers were provided. Armband removed by staff, pt discharged from ED ambulatory to home.  

## 2020-05-21 NOTE — ED Provider Notes (Signed)
[facility] EMERGENCY DEPARTMENT Provider Note   CSN: [id number] Arrival date & time: 05/21/20  1254     History Chief Complaint  Patient presents with   Chest Pain    [name] is a 44 y.o. female.  Received 324m ASA and nitroglycerin from EMS.   The history is provided by the patient.  Chest Pain Pain location:  Substernal area Pain quality: pressure   Pain radiates to:  Lower back Pain severity:  Severe Onset quality:  Gradual Duration:  3 days Timing:  Constant Progression:  Worsening Chronicity:  New Relieved by:  Nothing Worsened by:  Nothing Ineffective treatments:  Aspirin and nitroglycerin Associated symptoms: fatigue and shortness of breath   Associated symptoms: no abdominal pain, no back pain, no cough, no fever, no palpitations and no vomiting     HPI: A 44year old patient with a history of treated diabetes, hypertension, hypercholesterolemia and obesity presents for evaluation of chest pain. Initial onset of pain was more than 6 hours ago. The patient's chest pain is described as heaviness/pressure/tightness and is not worse with exertion. The patient's chest pain is middle- or left-sided, is not well-localized, is not sharp and does not radiate to the arms/jaw/neck. The patient does not complain of nausea and denies diaphoresis. The patient has no history of stroke, has no history of peripheral artery disease, has not smoked in the past 90 days and has no relevant family history of coronary artery disease (first degree relative at less than age 253.   Past Medical History:  Diagnosis Date   Abnormal Pap smear    Acute on chronic combined systolic and diastolic CHF (congestive heart failure) ([facility] 02/19/2019   Acute on chronic congestive heart failure (HCC)    Anemia    Asthma    Atrial fibrillation ([facility]    Diabetes mellitus    DKA, type 2 ([facility] 02/06/2019   Heart failure with reduced ejection fraction ([facility]     Hypertension    Major depressive disorder 02/08/2012   Reports that 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     Migraines    Morbid obesity ([facility] 02/06/2019   Obstructive sleep apnea 06/30/2015   Polycystic ovarian disease     Patient Active Problem List   Diagnosis Date Noted   Hypokalemia 10/13/2019   Hyponatremia 10/13/2019   Encounter for therapeutic drug monitoring 10/07/2019   Acute on chronic congestive heart failure ([facility]    Hypertensive urgency 09/30/2019   Elevated troponin 09/30/2019   Hypertensive emergency 09/30/2019   Attempting to conceive 03/10/2019   Acute on chronic systolic CHF (congestive heart failure) ([facility] 02/19/2019   Asthma    Yeast infection 02/06/2019   Right ventricular failure ([facility] 02/06/2019   Morbid obesity ([facility] 02/06/2019   HFrEF (heart failure with reduced ejection fraction) ([facility] 12/31/2018   Paroxysmal atrial fibrillation ([facility] 12/31/2018   Anxiety 12/31/2018   Hyperlipidemia associated with type 2 diabetes mellitus ([facility] 10/30/2017   Obstructive sleep apnea 06/30/2015   Anemia due to blood loss, chronic 06/27/2012   Allergic rhinitis 06/27/2012   Major depressive disorder 02/08/2012   Hypertension associated with diabetes ([facility] 12/04/2011   Dysmenorrhea 11/22/2011   Vaginal yeast infection 05/19/2011   DM2 (diabetes mellitus, type 2) ([facility] [facility] 05/19/2011   PCOS (polycystic ovarian syndrome) 05/19/2011   Hydrosalpinx 08/21/2009    Past Surgical History:  Procedure Laterality Date   CERVIX LESION DESTRUCTION  CESAREAN SECTION     RIGHT/LEFT HEART CATH AND CORONARY ANGIOGRAPHY N/A 03/29/2019   Procedure: RIGHT/LEFT HEART CATH AND CORONARY ANGIOGRAPHY;  Surgeon: Larey Dresser, MD;  Location: [facility];  Service: Cardiovascular;  Laterality: N/A;     OB History    Gravida  2   Para  2   Term  2   Preterm      AB      Living  2       SAB      TAB      Ectopic      Multiple      Live Births  2           Family History  Problem Relation Age of Onset   Diabetes Mother    Hypertension Mother    Hypertension Father    Diabetes Father     Social History   Tobacco Use   Smoking status: Never Smoker   Smokeless tobacco: Never Used  Vaping Use   Vaping Use: Never used  Substance Use Topics   Alcohol use: No    Alcohol/week: 0.0 standard drinks   Drug use: No    Home Medications Prior to Admission medications   Medication Sig Start Date End Date Taking? Authorizing Provider  albuterol (VENTOLIN HFA) 108 (90 Base) MCG/ACT inhaler Inhale 2 puffs into the lungs every 6 (six) hours as needed for wheezing or shortness of breath. 10/03/19  Yes Pokhrel, Laxman, MD  atorvastatin (LIPITOR) 40 MG tablet TAKE 1 TABLET BY MOUTH DAILY AT 6 PM. Patient taking differently: Take 40 mg by mouth every evening.  04/01/20  Yes Larey Dresser, MD  carvedilol (COREG) 25 MG tablet Take 1 tablet (25 mg total) by mouth 2 (two) times daily with a meal. 01/23/20  Yes Larey Dresser, MD  digoxin (LANOXIN) 0.125 MG tablet Take 1 tablet (0.125 mg total) by mouth daily. 11/15/19  Yes Rosita Fire, Brittainy M, PA-C  empagliflozin (JARDIANCE) 25 MG TABS tablet Take 25 mg by mouth daily. 10/03/19  Yes Pokhrel, Laxman, MD  gabapentin (NEURONTIN) 100 MG capsule Take 100 mg by mouth 2 (two) times daily.   Yes [provider]  hydrOXYzine (ATARAX/VISTARIL) 10 MG tablet Take 1 tablet (10 mg total) by mouth 3 (three) times daily as needed. Patient taking differently: Take 10 mg by mouth at bedtime.  02/10/20  Yes Gifford Shave, MD  insulin glargine (LANTUS) 100 UNIT/ML injection Inject 0.22 mLs (22 Units total) into the skin daily. 12/06/19  Yes Bland, Scott, DO  isosorbide-hydrALAZINE (BIDIL) 20-37.5 MG tablet Take 0.5 tablets by mouth 3 (three) times daily. 04/03/20  Yes Larey Dresser, MD  meclizine (ANTIVERT) 12.5 MG  tablet Take 1 tablet (12.5 mg total) by mouth 3 (three) times daily as needed for dizziness. Patient taking differently: Take 12.5 mg by mouth 3 (three) times daily as needed for dizziness. dizziness 09/19/19  Yes [name], DO  potassium chloride SA (KLOR-CON) 20 MEQ tablet Take 1 tablet by mouth every Monday Wednesday and Friday   Yes [provider]  sacubitril-valsartan (ENTRESTO) 97-103 MG Take 1 tablet by mouth 2 (two) times daily. 11/28/19  Yes Larey Dresser, MD  spironolactone (ALDACTONE) 25 MG tablet Take 1 tablet (25 mg total) by mouth daily. 11/15/19 05/21/20 Yes Simmons, Brittainy M, PA-C  torsemide (DEMADEX) 20 MG tablet Take 2 tablets (40 mg total) by mouth daily. 11/15/19  Yes Lyda Jester M, PA-C  traZODone (DESYREL) 50  MG tablet Take 1 tablet (50 mg total) by mouth at bedtime as needed for sleep. Patient taking differently: Take 50 mg by mouth at bedtime.  04/09/20  Yes Gifford Shave, MD  warfarin (COUMADIN) 7.5 MG tablet Take as directed by [facility], Needs OV for future refills Patient taking differently: Take 7.5-11.25 mg by mouth as directed. Take 1.5 tablets (11.25 mg) by mouth daily except for Sundays & Thursdays Take 1 tablet (7.5 mg) directed by [facility], Needs OV for future refills 04/22/20  Yes Camnitz, Ocie Doyne, MD  Accu-Chek Softclix Lancets lancets Use as instructed 02/12/19   Caroline More, DO  Accu-Chek Softclix Lancets lancets Check cbg 2 times daily 08/16/19   [name], DO  Blood Glucose Monitoring Suppl (ACCU-CHEK AVIVA PLUS) w/Device KIT 1 application by Does not apply route QID. Check fasting sugars as well as throughout the day 02/12/19   Caroline More, DO  glucose blood (ACCU-CHEK AVIVA PLUS) test strip Check CBG 2 times per day 08/16/19   [name], DO  glucose blood (AGAMATRIX PRESTO TEST) test strip Use as instructed 10/23/17   Zenia Resides, MD  Lancets Misc. (ACCU-CHEK SOFTCLIX LANCET DEV) KIT 1 application by Does  not apply route 2 (two) times daily. 08/16/19   [name], DO  methocarbamol (ROBAXIN) 500 MG tablet Take 1 tablet (500 mg total) by mouth every 8 (eight) hours as needed for muscle spasms. Patient not taking: Reported on 04/30/2020 07/13/19   [name], DO  gabapentin (NEURONTIN) 100 MG capsule Take 1 capsule (100 mg total) by mouth every 8 (eight) hours. 11/30/19   [name], DO    Allergies    Shellfish allergy, Dulaglutide, Metformin and related, and Ibuprofen  Review of Systems   Review of Systems  Constitutional: Positive for fatigue. Negative for chills, fever and unexpected weight change.  HENT: Negative for ear pain and sore throat.   Eyes: Negative for pain and visual disturbance.  Respiratory: Positive for shortness of breath. Negative for cough.   Cardiovascular: Positive for chest pain. Negative for palpitations and leg swelling.  Gastrointestinal: Negative for abdominal pain and vomiting.  Genitourinary: Negative for dysuria and hematuria.  Musculoskeletal: Negative for arthralgias and back pain.  Skin: Negative for color change and rash.  Neurological: Negative for seizures and syncope.  All other systems reviewed and are negative.   Physical Exam Updated Vital Signs BP 127/86 (BP Location: Right Arm)    Pulse 90    Temp 98.5 F (36.9 C) (Oral)    Resp 20    Ht '4\' 11"'  (1.499 m)    Wt 86.2 kg    LMP 05/09/2020    SpO2 97%    BMI 38.38 kg/m   Physical Exam Vitals and nursing note reviewed.  Constitutional:      Appearance: She is well-developed. She is obese. She is not ill-appearing, toxic-appearing or diaphoretic.  HENT:     Head: Normocephalic and atraumatic.     Mouth/Throat:     Mouth: Mucous membranes are moist.     Pharynx: Oropharynx is clear.  Eyes:     Conjunctiva/sclera: Conjunctivae normal.  Cardiovascular:     Rate and Rhythm: Normal rate and regular rhythm.     Pulses:          Radial pulses are 2+ on the right side and 2+ on the left side.      Heart sounds: No murmur heard.  No gallop.   Pulmonary:     Effort:  Pulmonary effort is normal. No respiratory distress.     Breath sounds: Normal breath sounds. No rhonchi or rales.  Abdominal:     Palpations: Abdomen is soft.     Tenderness: There is no abdominal tenderness.  Musculoskeletal:     Cervical back: Neck supple.     Right lower leg: No tenderness. No edema.     Left lower leg: No tenderness. No edema.  Skin:    General: Skin is warm and dry.     Capillary Refill: Capillary refill takes less than 2 seconds.  Neurological:     Mental Status: She is alert.     ED Results / Procedures / Treatments   Labs (all labs ordered are listed, but only abnormal results are displayed) Labs Reviewed  CBC - Abnormal; Notable for the following components:      Result Value   WBC 11.3 (*)    Hemoglobin 11.6 (*)    MCV 79.6 (*)    MCH 24.1 (*)    All other components within normal limits  COMPREHENSIVE METABOLIC PANEL - Abnormal; Notable for the following components:   Glucose, Bld 308 (*)    Albumin 3.1 (*)    All other components within normal limits  PROTIME-INR - Abnormal; Notable for the following components:   Prothrombin Time 18.2 (*)    INR 1.6 (*)    All other components within normal limits  URINALYSIS, ROUTINE W REFLEX MICROSCOPIC - Abnormal; Notable for the following components:   Color, Urine STRAW (*)    Specific Gravity, Urine 1.038 (*)    Glucose, UA >=500 (*)    All other components within normal limits  LIPASE, BLOOD - Abnormal; Notable for the following components:   Lipase 56 (*)    All other components within normal limits  URINE CULTURE  BRAIN NATRIURETIC PEPTIDE  D-DIMER, QUANTITATIVE (NOT AT [facility])  CBG MONITORING, ED  I-STAT BETA HCG BLOOD, ED ([facility], [facility], [facility] ONLY)  TROPONIN I (HIGH SENSITIVITY)  TROPONIN I (HIGH SENSITIVITY)    EKG EKG Interpretation  Date/Time:  Thursday May 21 2020 13:31:47 EDT Ventricular Rate:  86 PR Interval:     QRS Duration: 89 QT Interval:  352 QTC Calculation: 421 R Axis:   2 Text Interpretation: Sinus rhythm Probable left ventricular hypertrophy Nonspecific T abnormalities, diffuse leads No significant change since last tracing No STEMI Confirmed by Nanda Quinton [phone]) on 05/21/2020 1:36:27 PM   Radiology DG Chest Portable 1 View  Result Date: 05/21/2020 CLINICAL DATA:  Chest pain. EXAM: PORTABLE CHEST 1 VIEW COMPARISON:  September 29, 2019 FINDINGS: Enlarged cardiac silhouette. No consolidation. Chronic scarring in the left midlung. No visible pleural effusions or pneumothorax. No acute osseous abnormality. IMPRESSION: Cardiomegaly without evidence of acute cardiopulmonary disease. Electronically Signed   By: Margaretha Sheffield MD   On: 05/21/2020 13:37    Procedures Procedures (including critical care time)  Medications Ordered in ED Medications  fentaNYL (SUBLIMAZE) injection 75 mcg (75 mcg Intravenous Given 05/21/20 1503)    ED Course  I have reviewed the triage vital signs and the nursing notes.  Pertinent labs & imaging results that were available during my care of the patient were reviewed by me and considered in my medical decision making (see chart for details).    MDM Rules/Calculators/A&P HEAR Score: 4                        The patient is a 44yo female, PMH  CHF last known EF 35%, HTN, DM2, paroxysmal afib on coumadin, who presents to the ED via EMS for chest pain.  On my initial evaluation, the patient is hemodynamically stable, afebrile, nontoxic-appearing. Physical exam remarkable for no rales or edema.  Differentials considered include ACS, PE, PTX, aortic dissection, PNA, cholecystitis, pancreatitis. Due to stable vitals with no evidence on exam for volume overload or other significant cardiopulmonary findings, low suspicion for emergent etiology of pain. Upon further clarification, patient reports some pressure in her chest and in her lower back and reports that the pains  feel separate and not radiations, so low suspicion for aortic dissection. PERC negative.  EKG with diffuse TWI in inferior, anterior, and lateral leads with no significant ST changes. EKG unchanged from prior EKGs.   Patient provided IV fentanyl for pain. Labs remarkable for negative initial troponin and normal BNP. CXR unremarkable. INR subtherapeutic. Due to chest pain with SOB and subtherapeutic INR, despite negative PERC, will obtain D-dimer to screen for possible PE.   Transfer of care at 1500 to Dr. Ottis Stain. Plan of care communicated, which is that the patient is pending repeat troponin and D-dimer, and if both are reassuring, cardiology should be spoken to briefly to arrange outpatient follow up for atypical chest pain. Patient in stable condition at time of transfer.   The care of this patient was overseen by Dr. Laverta Baltimore, who agreed with evaluation and plan of care.   Final Clinical Impression(s) / ED Diagnoses Final diagnoses:  Chest pain, unspecified type    Rx / DC Orders ED Discharge Orders    None       Launa Flight, MD 05/21/20 Moreen Fowler, MD 05/22/20 [phone]

## 2020-05-21 NOTE — ED Provider Notes (Signed)
Care of the patient was assumed from Dr. Soyla Dryer at 3 pm; see this physician's note for complete history of present illness, review of systems, and physical exam.   Briefly, the patient is a 44 y.o. female who presented to the ED with chest pain.  Pain in lower back and chest since Monday. EKG stable. BNP and trop negative.  History non-occlusive cath 1 year ago.  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   Plan at time of handoff:  -follow up D-dimer  - follow up 2nd trop  - call cardiologist for recs on admit for card wu vs dc with outpt follow up    Additional MDM:   PCP: Gifford Shave, MD PCP-Cardiologist: Dr. Aundra Dubin  EP- Dr. Curt Bears   D-dimer negative. Repeat trop negative.  Cardiology contacted, no additional recommendations at this time, they will follow up with pt in clinic in next week. Pt already has an apt Nov 2 with HF Dr. Aundra Dubin.   Patient seen in conjunction with the attending physician, Dr. Rex Kras, MD, who participated in all aspects of the patient's care and was in agreement with the above plan.   Emergency Department Medication Summary: Medications  fentaNYL (SUBLIMAZE) injection 75 mcg (75 mcg Intravenous Given 05/21/20 1503)   Clinical Impression: 1. Chest pain, unspecified type       Roosevelt Locks, MD 05/21/20 1656    Little, Wenda Overland, MD 05/25/20 [phone]

## 2020-05-21 NOTE — ED Triage Notes (Signed)
Pt arrived by EMS from home complaining of chest pain that she has been having since Monday Pt rates pain a 7/10 right now, states it comes and goes.  Pt also c/o shortness of breath   Medics gave Aspirin and Nitro, c/o headache after nitro and no chest pain relief.   Hx of DM type 2 and HTN.

## 2020-05-21 NOTE — Progress Notes (Signed)
Paramedicine Encounter    Patient ID: [name], female    DOB: 09-Aug-1975, 44 y.o.   MRN: [id number]   Patient Care Team: Gifford Shave, MD as PCP - General (Family Medicine) Larey Dresser, MD as PCP - Cardiology (Cardiology) Constance Haw, MD as PCP - Electrophysiology (Cardiology) [name], LCSW as Social Worker (Licensed Clinical Social Worker)  Patient Active Problem List   Diagnosis Date Noted   Hypokalemia 10/13/2019   Hyponatremia 10/13/2019   Encounter for therapeutic drug monitoring 10/07/2019   Acute on chronic congestive heart failure ([place])    Hypertensive urgency 09/30/2019   Elevated troponin 09/30/2019   Hypertensive emergency 09/30/2019   Attempting to conceive 03/10/2019   Acute on chronic systolic CHF (congestive heart failure) ([place]) 02/19/2019   Asthma    Yeast infection 02/06/2019   Right ventricular failure ([place]) 02/06/2019   Morbid obesity ([place]) 02/06/2019   HFrEF (heart failure with reduced ejection fraction) ([place]) 12/31/2018   Paroxysmal atrial fibrillation ([place]) 12/31/2018   Anxiety 12/31/2018   Hyperlipidemia associated with type 2 diabetes mellitus ([place]) 10/30/2017   Obstructive sleep apnea 06/30/2015   Anemia due to blood loss, chronic 06/27/2012   Allergic rhinitis 06/27/2012   Major depressive disorder 02/08/2012   Hypertension associated with diabetes ([place]) 12/04/2011   Dysmenorrhea 11/22/2011   Vaginal yeast infection 05/19/2011   DM2 (diabetes mellitus, type 2) ([place]) 05/19/2011   PCOS (polycystic ovarian syndrome) 05/19/2011   Hydrosalpinx 08/21/2009    Current Outpatient Medications:    atorvastatin (LIPITOR) 40 MG tablet, TAKE 1 TABLET BY MOUTH DAILY AT 6 PM. (Patient taking differently: Take 40 mg by mouth every evening. ), Disp: 30 tablet, Rfl: 2   carvedilol (COREG) 25 MG tablet, Take 1 tablet (25 mg total) by mouth 2 (two) times daily with a meal., Disp: 180 tablet, Rfl: 3   digoxin  (LANOXIN) 0.125 MG tablet, Take 1 tablet (0.125 mg total) by mouth daily., Disp: 30 tablet, Rfl: 3   empagliflozin (JARDIANCE) 25 MG TABS tablet, Take 25 mg by mouth daily., Disp: 90 tablet, Rfl: 3   gabapentin (NEURONTIN) 100 MG capsule, Take 100 mg by mouth 2 (two) times daily., Disp: , Rfl:    insulin glargine (LANTUS) 100 UNIT/ML injection, Inject 0.22 mLs (22 Units total) into the skin daily., Disp: 10 mL, Rfl: 11   isosorbide-hydrALAZINE (BIDIL) 20-37.5 MG tablet, Take 0.5 tablets by mouth 3 (three) times daily., Disp: 90 tablet, Rfl: 3   potassium chloride SA (KLOR-CON) 20 MEQ tablet, Take 1 tablet by mouth every Monday Wednesday and Friday, Disp: , Rfl:    sacubitril-valsartan (ENTRESTO) 97-103 MG, Take 1 tablet by mouth 2 (two) times daily., Disp: 180 tablet, Rfl: 3   Accu-Chek Softclix Lancets lancets, Use as instructed, Disp: 100 each, Rfl: 12   Accu-Chek Softclix Lancets lancets, Check cbg 2 times daily, Disp: 100 each, Rfl: 12   albuterol (VENTOLIN HFA) 108 (90 Base) MCG/ACT inhaler, Inhale 2 puffs into the lungs every 6 (six) hours as needed for wheezing or shortness of breath., Disp: 18 g, Rfl: 1   Blood Glucose Monitoring Suppl (ACCU-CHEK AVIVA PLUS) w/Device KIT, 1 application by Does not apply route QID. Check fasting sugars as well as throughout the day, Disp: 1 kit, Rfl: 0   glucose blood (ACCU-CHEK AVIVA PLUS) test strip, Check CBG 2 times per day, Disp: 100 each, Rfl: 12   glucose blood (AGAMATRIX PRESTO TEST) test strip, Use as instructed, Disp: 100 each, Rfl: 12  hydrOXYzine (ATARAX/VISTARIL) 10 MG tablet, Take 1 tablet (10 mg total) by mouth 3 (three) times daily as needed. (Patient taking differently: Take 10 mg by mouth at bedtime. ), Disp: 30 tablet, Rfl: 0   Lancets Misc. (ACCU-CHEK SOFTCLIX LANCET DEV) KIT, 1 application by Does not apply route 2 (two) times daily., Disp: 1 kit, Rfl: 0   meclizine (ANTIVERT) 12.5 MG tablet, Take 1 tablet (12.5 mg total) by  mouth 3 (three) times daily as needed for dizziness. (Patient taking differently: Take 12.5 mg by mouth 3 (three) times daily as needed for dizziness. dizziness), Disp: 30 tablet, Rfl: 0   methocarbamol (ROBAXIN) 500 MG tablet, Take 1 tablet (500 mg total) by mouth every 8 (eight) hours as needed for muscle spasms. (Patient not taking: Reported on 04/30/2020), Disp: 30 tablet, Rfl: 0   spironolactone (ALDACTONE) 25 MG tablet, Take 1 tablet (25 mg total) by mouth daily., Disp: 30 tablet, Rfl: 3   torsemide (DEMADEX) 20 MG tablet, Take 2 tablets (40 mg total) by mouth daily., Disp: 60 tablet, Rfl: 3   traZODone (DESYREL) 50 MG tablet, Take 1 tablet (50 mg total) by mouth at bedtime as needed for sleep. (Patient taking differently: Take 50 mg by mouth at bedtime. ), Disp: 30 tablet, Rfl: 0   warfarin (COUMADIN) 7.5 MG tablet, Take as directed by Coumadin Clinic, Needs OV for future refills (Patient taking differently: Take 7.5-11.25 mg by mouth as directed. Take 1.5 tablets (11.25 mg) by mouth daily except for Sundays & Thursdays Take 1 tablet (7.5 mg) directed by Coumadin Clinic, Needs OV for future refills), Disp: 45 tablet, Rfl: 0 Allergies  Allergen Reactions   Shellfish Allergy Swelling    Airway involvement including EMS - Has Epi-Pen   Dulaglutide Nausea And Vomiting and Other (See Comments)    Multiple episodes of vomiting over multiple days   Metformin And Related Diarrhea   Ibuprofen     Per PCP interferes with daily meds       Social History   Socioeconomic History   Marital status: Single    Spouse name: Not on file   Number of children: Not on file   Years of education: Not on file   Highest education level: Not on file  Occupational History   Not on file  Tobacco Use   Smoking status: Never Smoker   Smokeless tobacco: Never Used  Vaping Use   Vaping Use: Never used  Substance and Sexual Activity   Alcohol use: No    Alcohol/week: 0.0 standard drinks    Drug use: No   Sexual activity: Yes  Other Topics Concern   Not on file  Social History Narrative   Not on file   Social Determinants of Health   Financial Resource Strain: High Risk   Difficulty of Paying Living Expenses: Hard  Food Insecurity:    Worried About Charity fundraiser in the Last Year: Not on file   [employer] of Food in the Last Year: Not on file  Transportation Needs:    Lack of Transportation (Medical): Not on file   Lack of Transportation (Non-Medical): Not on file  Physical Activity:    Days of Exercise per Week: Not on file   Minutes of Exercise per Session: Not on file  Stress:    Feeling of Stress : Not on file  Social Connections:    Frequency of Communication with Friends and Family: Not on file   Frequency of Social Gatherings with Friends  and Family: Not on file   Attends Religious Services: Not on file   Active Member of Clubs or Organizations: Not on file   Attends Archivist Meetings: Not on file   Marital Status: Not on file  Intimate Partner Violence:    Fear of Current or Ex-Partner: Not on file   Emotionally Abused: Not on file   Physically Abused: Not on file   Sexually Abused: Not on file    Physical Exam Cardiovascular:     Rate and Rhythm: Regular rhythm. Tachycardia present.     Pulses: Normal pulses.  Pulmonary:     Effort: Pulmonary effort is normal.     Breath sounds: Normal breath sounds.  Musculoskeletal:        General: Normal range of motion.     Right lower leg: No edema.     Left lower leg: No edema.  Skin:    General: Skin is warm and dry.     Capillary Refill: Capillary refill takes less than 2 seconds.  Neurological:     Mental Status: She is alert and oriented to person, place, and time.  Psychiatric:        Mood and Affect: Mood normal.         Future Appointments  Date Time Provider [place]  05/26/2020 11:00 AM [facility] ECHO OP 1 [facility]  05/26/2020 12:00 PM  Larey Dresser, MD [facility] None  05/26/2020  1:00 PM [facility]    BP 103/73 (BP Location: Left Arm, Patient Position: Sitting, Cuff Size: Normal)    Pulse (!) 106    Resp 16    Wt 194 lb (88 kg)    LMP 05/09/2020    SpO2 98%    BMI 39.18 kg/m   Weight yesterday- 195 lb Last visit weight- 198 lb  [name] was seen at home today and reported feeling unwell. She complained of orthopnea, and chest pain since Monday evening and stated the chest pain has gotten progressively worse. Today she ranks her pain as a 7/10 but denied radiation. She stated she has been compliant with her medications and was refilling her pillbox when I arrived. 12-lead ECG showed inverted T-waves in leads I, AVL, V5 and V6 but no ST segment elevation or depression were noted. I contacted the HF clinic with my findings and they advised she be transported to the ED for further evaluation. 20 gauge IV catheter was placed in her LAC with a saline lock. 324 mg aspirin was administered orally and she was given 1 SL nitroglycerine without relief of pain. Transport was requested via [facility] and her medications were verified and her pillbox was refilled while we awaited their arrival. I will follow up next week.   [name], EMT 05/21/20  ACTION: Home visit completed Next visit planned for 1 week Unstable: HF Clinic called: transporting for emergent visit as directed by clinc.

## 2020-05-22 ENCOUNTER — Telehealth: Payer: Self-pay | Admitting: *Deleted

## 2020-05-22 LAB — URINE CULTURE

## 2020-05-22 NOTE — Telephone Encounter (Signed)
Transition Care Management Unsuccessful Follow-up Telephone Call  Date of discharge and from where:  05/21/20 [name] ED  Attempts:  1st Attempt  Reason for unsuccessful TCM follow-up call:  Left voice message

## 2020-05-25 NOTE — Telephone Encounter (Signed)
Message will be closed as we are outside of the 72 hour window to contact patient.

## 2020-05-26 ENCOUNTER — Other Ambulatory Visit (HOSPITAL_COMMUNITY): Payer: Self-pay | Admitting: Cardiology

## 2020-05-26 ENCOUNTER — Ambulatory Visit (HOSPITAL_COMMUNITY)
Admission: RE | Admit: 2020-05-26 | Discharge: 2020-05-26 | Disposition: A | Discharge status: Home / Self Care | Payer: Medicaid Other | Source: Ambulatory Visit | Attending: Cardiology | Admitting: Cardiology

## 2020-05-26 ENCOUNTER — Encounter (HOSPITAL_COMMUNITY): Payer: Self-pay | Admitting: Cardiology

## 2020-05-26 ENCOUNTER — Ambulatory Visit (HOSPITAL_BASED_OUTPATIENT_CLINIC_OR_DEPARTMENT_OTHER)
Admission: RE | Admit: 2020-05-26 | Discharge: 2020-05-26 | Disposition: A | Discharge status: Home / Self Care | Payer: Medicaid Other | Source: Ambulatory Visit | Attending: Cardiology | Admitting: Cardiology

## 2020-05-26 ENCOUNTER — Other Ambulatory Visit: Payer: Self-pay

## 2020-05-26 VITALS — BP 130/80 | HR 97 | Wt 201.6 lb

## 2020-05-26 DIAGNOSIS — I428 Other cardiomyopathies: Secondary | ICD-10-CM | POA: Diagnosis not present

## 2020-05-26 DIAGNOSIS — E785 Hyperlipidemia, unspecified: Secondary | ICD-10-CM | POA: Diagnosis not present

## 2020-05-26 DIAGNOSIS — E282 Polycystic ovarian syndrome: Secondary | ICD-10-CM | POA: Insufficient documentation

## 2020-05-26 DIAGNOSIS — I5020 Unspecified systolic (congestive) heart failure: Secondary | ICD-10-CM

## 2020-05-26 DIAGNOSIS — I513 Intracardiac thrombosis, not elsewhere classified: Secondary | ICD-10-CM | POA: Insufficient documentation

## 2020-05-26 DIAGNOSIS — Z8249 Family history of ischemic heart disease and other diseases of the circulatory system: Secondary | ICD-10-CM | POA: Insufficient documentation

## 2020-05-26 DIAGNOSIS — Z7901 Long term (current) use of anticoagulants: Secondary | ICD-10-CM | POA: Insufficient documentation

## 2020-05-26 DIAGNOSIS — R000 Tachycardia, unspecified: Secondary | ICD-10-CM | POA: Insufficient documentation

## 2020-05-26 DIAGNOSIS — Z596 Low income: Secondary | ICD-10-CM | POA: Diagnosis not present

## 2020-05-26 DIAGNOSIS — E1165 Type 2 diabetes mellitus with hyperglycemia: Secondary | ICD-10-CM | POA: Insufficient documentation

## 2020-05-26 DIAGNOSIS — F32A Depression, unspecified: Secondary | ICD-10-CM | POA: Diagnosis not present

## 2020-05-26 DIAGNOSIS — I5022 Chronic systolic (congestive) heart failure: Secondary | ICD-10-CM | POA: Diagnosis not present

## 2020-05-26 DIAGNOSIS — I480 Paroxysmal atrial fibrillation: Secondary | ICD-10-CM | POA: Insufficient documentation

## 2020-05-26 DIAGNOSIS — Z794 Long term (current) use of insulin: Secondary | ICD-10-CM | POA: Insufficient documentation

## 2020-05-26 DIAGNOSIS — Z79899 Other long term (current) drug therapy: Secondary | ICD-10-CM | POA: Diagnosis not present

## 2020-05-26 DIAGNOSIS — I2510 Atherosclerotic heart disease of native coronary artery without angina pectoris: Secondary | ICD-10-CM | POA: Diagnosis not present

## 2020-05-26 DIAGNOSIS — I110 Hypertensive heart disease with heart failure: Secondary | ICD-10-CM | POA: Insufficient documentation

## 2020-05-26 LAB — ECHOCARDIOGRAM COMPLETE
Calc EF: 35.2 %
S' Lateral: 3.5 cm
Single Plane A2C EF: 34.1 %
Single Plane A4C EF: 37.6 %

## 2020-05-26 MED ORDER — IVABRADINE HCL 5 MG PO TABS
5.0000 mg | ORAL_TABLET | Freq: Two times a day (BID) | ORAL | 3 refills | Status: DC
Start: 2020-05-26 — End: 2020-05-26

## 2020-05-26 MED ORDER — FUROSEMIDE 20 MG PO TABS: 20.0000 mg | ORAL_TABLET | Freq: Every day | ORAL | 3 refills | Status: DC

## 2020-05-26 MED ORDER — SERTRALINE HCL 25 MG PO TABS: 25.0000 mg | ORAL_TABLET | Freq: Every day | ORAL | 3 refills | Status: DC

## 2020-05-26 MED ORDER — TORSEMIDE 20 MG PO TABS
60.0000 mg | ORAL_TABLET | Freq: Every day | ORAL | 3 refills | Status: DC
Start: 2020-05-26 — End: 2020-06-16

## 2020-05-26 MED FILL — SERTRALINE HCL 25 MG TABLET: 25 | 30 days supply | Qty: 30 | Fill #0

## 2020-05-26 MED FILL — FUROSEMIDE 20 MG TABS: 20 | 30 days supply | Qty: 30 | Fill #0

## 2020-05-26 MED FILL — CORLANOR 5 MG TABLET: 5 | 30 days supply | Qty: 60 | Fill #0

## 2020-05-26 MED FILL — TORSEMIDE 20 MG TABLET: 20 | 30 days supply | Qty: 90 | Fill #0

## 2020-05-26 NOTE — Progress Notes (Signed)
Dr. Aundra Dubin made aware of Reds clip value of 40%. Lasix 20mg  po daily to start.

## 2020-05-26 NOTE — Patient Instructions (Addendum)
START Sertraline 25mg  (1 tablet) Daily  START Corlanor 5mg  (1 tablet) daily  Increase Torsemide to 60 mg (3 tabs) Daily  Your physician has requested that you have a cardiac MRI. Cardiac MRI uses a computer to create images of your heart as its beating, producing both still and moving pictures of your heart and major blood vessels. For further information please visit [email or web address] Please follow the instruction sheet given to you today for more information. ONCE WE APPROVE WITH INSURANCE, WE WILL CONTACT YOU TO SCHEDULE  Your physician recommends that you return for repeat labs in 2 weeks  Your physician recommends that you schedule a follow-up appointment in: 3-4 weeks with our APP clinic  If you have any questions or concerns before your next appointment please send [place] a message through [place] or call our office at [phone].    TO LEAVE A MESSAGE FOR THE NURSE SELECT OPTION 2, PLEASE LEAVE A MESSAGE INCLUDING: . YOUR NAME . DATE OF BIRTH . CALL BACK NUMBER . REASON FOR CALL**this is important as we prioritize the call backs  YOU WILL RECEIVE A CALL BACK THE SAME DAY AS LONG AS YOU CALL BEFORE 4:00 PM

## 2020-05-26 NOTE — Progress Notes (Signed)
ReDS Vest / Clip - 05/26/20 1200      ReDS Vest / Clip   Station Marker B    Ruler Value 31    ReDS Value Range Moderate volume overload    ReDS Actual Value 40

## 2020-05-26 NOTE — Progress Notes (Signed)
Advanced Heart Failure Clinic Note   Referring Physician: PCP: Gifford Shave, MD PCP-Cardiologist: Dr. Aundra Dubin  EP- Dr. Curt Bears   HPI: 44 y.o. with history of type 2 diabetes (poorly controlled, last hgbA1c > 15), poorly controlled HTN, hyperlipidemia, paroxysmal atrial fibrillation, and chronic systolic CHF of uncertain etiology.  Patient had an echo in 6/20 as outpatient due to exertional dyspnea. This showed EF 20-25%, moderate RV dysfunction.  She was also noted to have paroxysmal atrial fibrillation.  She was started on Xarelto.  In 7/20, she was admitted with DKA. In 8/20, she was admitted with CHF exacerbation and diuresed.  She was sent home on Lasix 40 mg po bid.   Readmitted on 9/20 with CHF exacerbation, in the setting of elevated BP.  Diuresed with IV lasix.  LHC showed nonobstructive CAD.  RHC showed R>L heart failure with low PAPI suggesting significant RV dysfunction. She was transitioned to PO torsemide and treated w/ guidelines directed medical therapy w/ Entresto, Spironolactone, Coreg and digoxin. She was also transitioned off of Xarelto and placed on Coumadin.  Readmitted 07/27/2438 with A/C systolic heart failure. She had not been taking her lasix because she says it was on her AVS to stop it.  Diuresed with IV lasix and transitioned to torsemide 40 mg daily. Weight day before discharge was 190 pounds. Echo with LV EF <20%, RV severely decreased systolic function.   Echo in 6/21 showed EF 35%, moderately decreased RV systolic function, moderate LAE, mild MR.  Echo was done today and reviewed, EF 35-40%, global hypokinesis, mild LVH, normal RV, IVC normal.   She returns for followup of CHF.  Weight is up 7 lbs.  She is still getting headaches that she attributes to Bidil but much decreased over time and tolerable.  She reports significant generalized fatigue.  She feels like her heart is racing and it bothers her.  She is in sinus tachycardia continuously.  She is not working.   Symptoms wax and wane.  She has been more short of breath recently, dyspnea after walking about a block or going up stairs/inclines.  No chest pain. She does have orthopnea. She reports depressed mood and is tearful.   ECG (personally reviewed, 10/21): NSR, nonspecific TWIs.    Labs (5/21): hgbA1c 9.3, BNP 76, K 3.9, creatinine 1.1 Labs (6/21): digoxin 0.4, K 4.1, creatinine 0.77 Labs (10/21): BNP 25, K 4.1, creatinine 0.91  REDS clilp 40%  PMH: 1. Type 2 diabetes: H/o DKA 2. HTN 3. OSA 4. Obesity 5. Polycystic ovarian syndrome 6. Chronic systolic CHF: Nonischemic cardiomyopathy - Echo (6/20) with EF 20-25%, moderate RV dysfunction.  - LHC/RHC (9/20): No significant CAD.  - Echo (3/21): EF <20%, moderate LVH with global HK, severely reduced RV systolic function.  - Echo (6/21): EF 35%, moderately decreased RV systolic function, moderate LAE, mild MR. - Echo (11/21): EF 35-40%, global hypokinesis, mild LVH, normal RV, IVC normal.  7. Hyperlipidemia 8. Atrial fibrillation: Paroxysmal.  9. History of LV thrombus.    Current Outpatient Medications  Medication Sig Dispense Refill  . Accu-Chek Softclix Lancets lancets Use as instructed 100 each 12  . Accu-Chek Softclix Lancets lancets Check cbg 2 times daily 100 each 12  . albuterol (VENTOLIN HFA) 108 (90 Base) MCG/ACT inhaler Inhale 2 puffs into the lungs every 6 (six) hours as needed for wheezing or shortness of breath. 18 g 1  . atorvastatin (LIPITOR) 40 MG tablet TAKE 1 TABLET BY MOUTH DAILY AT 6 PM. (Patient taking differently:  Take 40 mg by mouth every evening. ) 30 tablet 2  . Blood Glucose Monitoring Suppl (ACCU-CHEK AVIVA PLUS) w/Device KIT 1 application by Does not apply route QID. Check fasting sugars as well as throughout the day 1 kit 0  . carvedilol (COREG) 25 MG tablet Take 1 tablet (25 mg total) by mouth 2 (two) times daily with a meal. 180 tablet 3  . digoxin (LANOXIN) 0.125 MG tablet Take 1 tablet (0.125 mg total) by  mouth daily. 30 tablet 3  . empagliflozin (JARDIANCE) 25 MG TABS tablet Take 25 mg by mouth daily. 90 tablet 3  . gabapentin (NEURONTIN) 100 MG capsule Take 100 mg by mouth 2 (two) times daily.    Marland Kitchen glucose blood (ACCU-CHEK AVIVA PLUS) test strip Check CBG 2 times per day 100 each 12  . glucose blood (AGAMATRIX PRESTO TEST) test strip Use as instructed 100 each 12  . hydrOXYzine (ATARAX/VISTARIL) 10 MG tablet Take 10 mg by mouth. Daily    . insulin glargine (LANTUS) 100 UNIT/ML injection Inject 0.22 mLs (22 Units total) into the skin daily. 10 mL 11  . isosorbide-hydrALAZINE (BIDIL) 20-37.5 MG tablet Take 0.5 tablets by mouth 3 (three) times daily. 90 tablet 3  . Lancets Misc. (ACCU-CHEK SOFTCLIX LANCET DEV) KIT 1 application by Does not apply route 2 (two) times daily. 1 kit 0  . meclizine (ANTIVERT) 12.5 MG tablet Take 1 tablet (12.5 mg total) by mouth 3 (three) times daily as needed for dizziness. (Patient taking differently: Take 12.5 mg by mouth 3 (three) times daily as needed for dizziness. dizziness) 30 tablet 0  . methocarbamol (ROBAXIN) 500 MG tablet Take 1 tablet (500 mg total) by mouth every 8 (eight) hours as needed for muscle spasms. 30 tablet 0  . potassium chloride SA (KLOR-CON) 20 MEQ tablet Take 1 tablet by mouth every Monday Wednesday and Friday    . sacubitril-valsartan (ENTRESTO) 97-103 MG Take 1 tablet by mouth 2 (two) times daily. 180 tablet 3  . spironolactone (ALDACTONE) 25 MG tablet Take 1 tablet (25 mg total) by mouth daily. 30 tablet 3  . torsemide (DEMADEX) 20 MG tablet Take 3 tablets (60 mg total) by mouth daily. 90 tablet 3  . traZODone (DESYREL) 50 MG tablet Take 1 tablet (50 mg total) by mouth at bedtime as needed for sleep. (Patient taking differently: Take 50 mg by mouth at bedtime. ) 30 tablet 0  . warfarin (COUMADIN) 7.5 MG tablet Take as directed by Coumadin Clinic, Needs OV for future refills (Patient taking differently: Take 7.5-11.25 mg by mouth as directed.  Take 1.5 tablets (11.25 mg) by mouth daily except for Sundays & Thursdays Take 1 tablet (7.5 mg) directed by Coumadin Clinic, Needs OV for future refills) 45 tablet 0  . ivabradine (CORLANOR) 5 MG TABS tablet Take 1 tablet (5 mg total) by mouth 2 (two) times daily with a meal. 60 tablet 3  . sertraline (ZOLOFT) 25 MG tablet Take 1 tablet (25 mg total) by mouth daily. 30 tablet 3   No current facility-administered medications for this encounter.    Allergies  Allergen Reactions  . Shellfish Allergy Swelling    Airway involvement including EMS - Has Epi-Pen  . Dulaglutide Nausea And Vomiting and Other (See Comments)    Multiple episodes of vomiting over multiple days  . Metformin And Related Diarrhea  . Ibuprofen     Per PCP interferes with daily meds       Social History   Socioeconomic  History  . Marital status: Single    Spouse name: Not on file  . Number of children: Not on file  . Years of education: Not on file  . Highest education level: Not on file  Occupational History  . Not on file  Tobacco Use  . Smoking status: Never Smoker  . Smokeless tobacco: Never Used  Vaping Use  . Vaping Use: Never used  Substance and Sexual Activity  . Alcohol use: No    Alcohol/week: 0.0 standard drinks  . Drug use: No  . Sexual activity: Yes  Other Topics Concern  . Not on file  Social History Narrative  . Not on file   Social Determinants of Health   Financial Resource Strain: High Risk  . Difficulty of Paying Living Expenses: Hard  Food Insecurity:   . Worried About Charity fundraiser in the Last Year: Not on file  . Ran Out of Food in the Last Year: Not on file  Transportation Needs:   . Lack of Transportation (Medical): Not on file  . Lack of Transportation (Non-Medical): Not on file  Physical Activity:   . Days of Exercise per Week: Not on file  . Minutes of Exercise per Session: Not on file  Stress:   . Feeling of Stress : Not on file  Social Connections:   .  Frequency of Communication with Friends and Family: Not on file  . Frequency of Social Gatherings with Friends and Family: Not on file  . Attends Religious Services: Not on file  . Active Member of Clubs or Organizations: Not on file  . Attends Archivist Meetings: Not on file  . Marital Status: Not on file  Intimate Partner Violence:   . Fear of Current or Ex-Partner: Not on file  . Emotionally Abused: Not on file  . Physically Abused: Not on file  . Sexually Abused: Not on file      Family History  Problem Relation Age of Onset  . Diabetes Mother   . Hypertension Mother   . Hypertension Father   . Diabetes Father     Vitals:   05/26/20 1147  BP: 130/80  Pulse: 97  SpO2: 98%  Weight: 91.4 kg (201 lb 9.6 oz)  General: NAD Neck: Thick JVP difficult but does not appear elevated, no thyromegaly or thyroid nodule.  Lungs: Clear to auscultation bilaterally with normal respiratory effort. CV: Nondisplaced PMI.  Heart regular S1/S2, no S3/S4, no murmur.  No peripheral edema.  No carotid bruit.  Normal pedal pulses.  Abdomen: Soft, nontender, no hepatosplenomegaly, no distention.  Skin: Intact without lesions or rashes.  Neurologic: Alert and oriented x 3.  Psych: Normal affect. Extremities: No clubbing or cyanosis.  HEENT: Normal.    ASSESSMENT & PLAN:  1. Acute on chronic systolic CHF:  Nonischemic cardiomyopathy, diagnosed in 6/20. Repeat Echo 03/2019 showed EF 20-25%, diffuse hypokinesis, LV thrombus, moderate RV systolic dysfunction, mild MR, moderate TR. Echo in 3/21 with EF <30%, severe RV dysfunction. She has a history of paroxysmal atrial fibrillation but seems to be mostly in sinus rhythm. This is not likely to be a tachycardia-mediated cardiomyopathy. Poorly controlled diabetes itself can cause a cardiomyopathy, as can poorly controlled HTN. Myocarditis is a potential cause of her cardiomyopathy. [place] 9/20 showed nonobstructive CAD. RHC showed R>L heart  failure with low PAPI suggesting significant RV dysfunction.  Echo in 6/21 with EF up to 35%, echo today showed EF 35-40%. NYHA class III symptoms, feels worse.  Weight is up and REDS clip suggest fluid overload, but IVC is small on echo and she is not volume overloaded on exam.  - Continue Entresto 97/103 bid.  - Given symptoms and REDS clip, I am going to go ahead and increase her torsemide to 60 mg daily with BMET today and 10 days.  - Sinus tachycardia is bothersome for her, she is already on maximal Coreg.  I will start her on ivabradine 5 mg bid. Hopefully this will help her symptoms.  - Continue spironolactone 25 mg daily.  - Continue Coreg 25 mg bid.  - Continue digoxin. Check dig level today  - Continue empagliflozin.  - Continue Bidil 1/2 tab tid, would not increase with headaches.   - Continue w/ paramedicine. - I am going to arrange for cardiac MRI to look for scar/infiltrative disease and quantify EF.  If EF < 35% on cMRI (and especially if there is significant delayed enhancement), will need to consider ICD. She would not qualify for CRT.  - She previously had been trying to get pregnant (has been difficult with polycystic ovarian syndrome). She has 2 grown children already.With her cardiomyopathy and active CHF, pregnancy would create a significant mortality risk for her. A number of her cardiac meds to control her CHF are feto-toxic. She understands this risk and is no longer trying to get pregnant. Knows importance of using contraception to avoid pregnancy.  2. 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  - Compliant w/ coumadin.  3. Atrial fibrillation: Paroxysmal. NSR today.  - Continue warfarin (using warfarin rather than DOAC with h/o LV thrombus).  INRs followed by PCP - Would consider atrial fibrillation ablation in the future, if recurrence, given [facility]ST[facility]-HF data.  4. Type 2 diabetes: per PCP.   5. Hypertension: BP controlled.   6. LV thrombus: She is on warfarin.    Followup in 3 wks with NP/PA.   Loralie Champagne, MD 05/26/20

## 2020-05-26 NOTE — Progress Notes (Signed)
  Echocardiogram 2D Echocardiogram has been performed.  [name] 05/26/2020, 11:49 AM

## 2020-05-26 NOTE — Progress Notes (Signed)
Per Zack with paramedicine pt is taking Torsemide 40 mg Daily, as he fixes her pill box. Per Dr Aundra Dubin increase dose to 60 mg Daily, do not start Furosemide.  Edwyna Ready will inform patient, med list and AVS updated.

## 2020-05-29 ENCOUNTER — Telehealth (HOSPITAL_COMMUNITY): Payer: Self-pay

## 2020-05-29 NOTE — Telephone Encounter (Signed)
I called [name] to see if she was still available for our scheduled appointment this afternoon. She stated she would need to reschedule because she was going to try and work during the time we were supposed to meet. I advised I will be out of the office next week and she stated she would be ok to wait until I return. I will follow up in two weeks.   [name], EMT 05/29/20

## 2020-06-03 MED FILL — SERTRALINE HCL 25 MG TABLET: 25 | 30 days supply | Qty: 30 | Fill #0

## 2020-06-03 MED FILL — busPIRone HCL 7.5 MG TABS: 7.5 | 30 days supply | Qty: 60 | Fill #0

## 2020-06-03 MED FILL — traZODone HCL 50 MG TABS: 50 | 30 days supply | Qty: 30 | Fill #0

## 2020-06-03 MED FILL — ATORVASTATIN 40 MG TABLET: 40 | 30 days supply | Qty: 30 | Fill #2

## 2020-06-03 MED FILL — TORSEMIDE 20 MG TABLET: 20 | 30 days supply | Qty: 90 | Fill #0

## 2020-06-03 MED FILL — CORLANOR 5 MG TABLET: 5 | 30 days supply | Qty: 60 | Fill #0

## 2020-06-03 MED FILL — hydrOXYzine HCL 10 MG TABS: 10 | 10 days supply | Qty: 30 | Fill #0

## 2020-06-04 ENCOUNTER — Other Ambulatory Visit: Payer: Self-pay | Admitting: *Deleted

## 2020-06-04 DIAGNOSIS — G8929 Other chronic pain: Secondary | ICD-10-CM

## 2020-06-04 MED ORDER — METHOCARBAMOL 500 MG PO TABS: 500.0000 mg | ORAL_TABLET | Freq: Three times a day (TID) | ORAL | 0 refills | Status: DC | PRN

## 2020-06-04 MED FILL — METHOCARBAMOL 500 MG TABS: 500 | 10 days supply | Qty: 30 | Fill #0

## 2020-06-05 ENCOUNTER — Ambulatory Visit (HOSPITAL_COMMUNITY)
Admission: RE | Admit: 2020-06-05 | Discharge: 2020-06-05 | Disposition: A | Discharge status: Home / Self Care | Payer: Medicaid Other | Source: Ambulatory Visit | Attending: Cardiology | Admitting: Cardiology

## 2020-06-05 ENCOUNTER — Other Ambulatory Visit: Payer: Self-pay

## 2020-06-05 DIAGNOSIS — I5020 Unspecified systolic (congestive) heart failure: Secondary | ICD-10-CM | POA: Diagnosis not present

## 2020-06-05 LAB — BASIC METABOLIC PANEL
Anion gap: 11 (ref 5–15)
BUN: 10 mg/dL (ref 6–20)
CO2: 21 mmol/L — ABNORMAL LOW (ref 22–32)
Calcium: 8.8 mg/dL — ABNORMAL LOW (ref 8.9–10.3)
Chloride: 96 mmol/L — ABNORMAL LOW (ref 98–111)
Creatinine, Ser: 0.99 mg/dL (ref 0.44–1.00)
GFR, Estimated: >60 mL/min (ref >60)
Glucose, Bld: 532 mg/dL (ref 70–99)
Potassium: 4 mmol/L (ref 3.5–5.1)
Sodium: 128 mmol/L — ABNORMAL LOW (ref 135–145)

## 2020-06-06 ENCOUNTER — Telehealth: Payer: Self-pay | Admitting: Family Medicine

## 2020-06-06 ENCOUNTER — Other Ambulatory Visit: Payer: Self-pay | Admitting: Family Medicine

## 2020-06-06 MED ORDER — ALBUTEROL SULFATE HFA 108 (90 BASE) MCG/ACT IN AERS: 2.0000 | INHALATION_SPRAY | Freq: Four times a day (QID) | RESPIRATORY_TRACT | 0 refills | Status: DC | PRN

## 2020-06-06 NOTE — Telephone Encounter (Signed)
**  After Hours/ Emergency Line Call**  Received a call to report that [name] requesting prescription refill for Albuterol .  Endorsing some increase in shortness of breath secondary to weather change.  Denying difficulty breathing, lower extremity swelling or chest pain.  Recommended that if symptoms worsen she follow call back emergency line or go to Urgent Care or ED.  Red flags discussed.  Will forward to PCP.  Carollee Leitz, MD PGY-2, [facility] 06/06/2020 12:09 PM

## 2020-06-11 ENCOUNTER — Telehealth: Payer: Self-pay | Admitting: *Deleted

## 2020-06-11 NOTE — Telephone Encounter (Signed)
Pt overdue for a INR check. Called pt and LMOM.

## 2020-06-12 ENCOUNTER — Other Ambulatory Visit (HOSPITAL_COMMUNITY): Payer: Self-pay

## 2020-06-12 NOTE — Progress Notes (Signed)
Paramedicine Encounter    Patient ID: [name], female    DOB: [birth year], 44 y.o.   MRN: [id number]   Patient Care Team: Gifford Shave, MD as PCP - General (Family Medicine) Larey Dresser, MD as PCP - Cardiology (Cardiology) Constance Haw, MD as PCP - Electrophysiology (Cardiology) [name], LCSW as Social Worker (Licensed Clinical Social Worker)  Patient Active Problem List   Diagnosis Date Noted  . Hypokalemia 10/13/2019  . Hyponatremia 10/13/2019  . Encounter for therapeutic drug monitoring 10/07/2019  . Acute on chronic congestive heart failure ([place])   . Hypertensive urgency 09/30/2019  . Elevated troponin 09/30/2019  . Hypertensive emergency 09/30/2019  . Attempting to conceive 03/10/2019  . Acute on chronic systolic CHF (congestive heart failure) ([place]) 02/19/2019  . Asthma   . Yeast infection 02/06/2019  . Right ventricular failure ([place]) 02/06/2019  . Morbid obesity ([place] ) 02/06/2019  . HFrEF (heart failure with reduced ejection fraction) ([place]) 12/31/2018  . Paroxysmal atrial fibrillation ([place]) 12/31/2018  . Anxiety 12/31/2018  . Hyperlipidemia associated with type 2 diabetes mellitus ([place]) 10/30/2017  . Obstructive sleep apnea 06/30/2015  . Anemia due to blood loss, chronic 06/27/2012  . Allergic rhinitis 06/27/2012  . Major depressive disorder 02/08/2012  . Hypertension associated with diabetes ([place]) 12/04/2011  . Dysmenorrhea 11/22/2011  . Vaginal yeast infection 05/19/2011  . DM2 (diabetes mellitus, type 2) ([place]) 05/19/2011  . PCOS (polycystic ovarian syndrome) 05/19/2011  . Hydrosalpinx 08/21/2009    Current Outpatient Medications:  .  atorvastatin (LIPITOR) 40 MG tablet, TAKE 1 TABLET BY MOUTH DAILY AT 6 PM. (Patient taking differently: Take 40 mg by mouth every evening. ), Disp: 30 tablet, Rfl: 2 .  carvedilol (COREG) 25 MG tablet, Take 1 tablet (25 mg total) by mouth 2 (two) times daily with a meal., Disp: 180 tablet, Rfl: 3 .  digoxin  (LANOXIN) 0.125 MG tablet, Take 1 tablet (0.125 mg total) by mouth daily., Disp: 30 tablet, Rfl: 3 .  empagliflozin (JARDIANCE) 25 MG TABS tablet, Take 25 mg by mouth daily., Disp: 90 tablet, Rfl: 3 .  gabapentin (NEURONTIN) 100 MG capsule, Take 100 mg by mouth 2 (two) times daily., Disp: , Rfl:  .  hydrOXYzine (ATARAX/VISTARIL) 10 MG tablet, Take 10 mg by mouth. Daily, Disp: , Rfl:  .  insulin glargine (LANTUS) 100 UNIT/ML injection, Inject 0.22 mLs (22 Units total) into the skin daily., Disp: 10 mL, Rfl: 11 .  isosorbide-hydrALAZINE (BIDIL) 20-37.5 MG tablet, Take 0.5 tablets by mouth 3 (three) times daily., Disp: 90 tablet, Rfl: 3 .  ivabradine (CORLANOR) 5 MG TABS tablet, Take 1 tablet (5 mg total) by mouth 2 (two) times daily with a meal., Disp: 60 tablet, Rfl: 3 .  meclizine (ANTIVERT) 12.5 MG tablet, Take 1 tablet (12.5 mg total) by mouth 3 (three) times daily as needed for dizziness. (Patient taking differently: Take 12.5 mg by mouth 3 (three) times daily as needed for dizziness. dizziness), Disp: 30 tablet, Rfl: 0 .  potassium chloride SA (KLOR-CON) 20 MEQ tablet, Take 1 tablet by mouth every Monday Wednesday and Friday, Disp: , Rfl:  .  sacubitril-valsartan (ENTRESTO) 97-103 MG, Take 1 tablet by mouth 2 (two) times daily., Disp: 180 tablet, Rfl: 3 .  sertraline (ZOLOFT) 25 MG tablet, Take 1 tablet (25 mg total) by mouth daily., Disp: 30 tablet, Rfl: 3 .  spironolactone (ALDACTONE) 25 MG tablet, Take 1 tablet (25 mg total) by mouth daily., Disp: 30 tablet, Rfl: 3 .  torsemide (DEMADEX) 20 MG tablet, Take 3 tablets (60 mg total) by mouth daily., Disp: 90 tablet, Rfl: 3 .  traZODone (DESYREL) 50 MG tablet, Take 1 tablet (50 mg total) by mouth at bedtime as needed for sleep. (Patient taking differently: Take 50 mg by mouth at bedtime. ), Disp: 30 tablet, Rfl: 0 .  warfarin (COUMADIN) 7.5 MG tablet, Take as directed by Coumadin Clinic, Needs OV for future refills (Patient taking differently: Take  7.5-11.25 mg by mouth as directed. Take 1.5 tablets (11.25 mg) by mouth daily except for Sundays & Thursdays Take 1 tablet (7.5 mg) directed by Coumadin Clinic, Needs OV for future refills), Disp: 45 tablet, Rfl: 0 .  Accu-Chek Softclix Lancets lancets, Use as instructed, Disp: 100 each, Rfl: 12 .  Accu-Chek Softclix Lancets lancets, Check cbg 2 times daily, Disp: 100 each, Rfl: 12 .  albuterol (VENTOLIN HFA) 108 (90 Base) MCG/ACT inhaler, Inhale 2 puffs into the lungs every 6 (six) hours as needed for wheezing or shortness of breath., Disp: 18 g, Rfl: 1 .  albuterol (VENTOLIN HFA) 108 (90 Base) MCG/ACT inhaler, Inhale 2 puffs into the lungs every 6 (six) hours as needed for wheezing or shortness of breath., Disp: 8 g, Rfl: 0 .  Blood Glucose Monitoring Suppl (ACCU-CHEK AVIVA PLUS) w/Device KIT, 1 application by Does not apply route QID. Check fasting sugars as well as throughout the day, Disp: 1 kit, Rfl: 0 .  glucose blood (ACCU-CHEK AVIVA PLUS) test strip, Check CBG 2 times per day, Disp: 100 each, Rfl: 12 .  glucose blood (AGAMATRIX PRESTO TEST) test strip, Use as instructed, Disp: 100 each, Rfl: 12 .  Lancets Misc. (ACCU-CHEK SOFTCLIX LANCET DEV) KIT, 1 application by Does not apply route 2 (two) times daily., Disp: 1 kit, Rfl: 0 .  methocarbamol (ROBAXIN) 500 MG tablet, Take 1 tablet (500 mg total) by mouth every 8 (eight) hours as needed for muscle spasms. (Patient not taking: Reported on 06/12/2020), Disp: 30 tablet, Rfl: 0 Allergies  Allergen Reactions  . Shellfish Allergy Swelling    Airway involvement including EMS - Has Epi-Pen  . Dulaglutide Nausea And Vomiting and Other (See Comments)    Multiple episodes of vomiting over multiple days  . Metformin And Related Diarrhea  . Ibuprofen     Per PCP interferes with daily meds       Social History   Socioeconomic History  . Marital status: Single    Spouse name: Not on file  . Number of children: Not on file  . Years of education:  Not on file  . Highest education level: Not on file  Occupational History  . Not on file  Tobacco Use  . Smoking status: Never Smoker  . Smokeless tobacco: Never Used  Vaping Use  . Vaping Use: Never used  Substance and Sexual Activity  . Alcohol use: No    Alcohol/week: 0.0 standard drinks  . Drug use: No  . Sexual activity: Yes  Other Topics Concern  . Not on file  Social History Narrative  . Not on file   Social Determinants of Health   Financial Resource Strain: High Risk  . Difficulty of Paying Living Expenses: Hard  Food Insecurity:   . Worried About Charity fundraiser in the Last Year: Not on file  . Ran Out of Food in the Last Year: Not on file  Transportation Needs:   . Lack of Transportation (Medical): Not on file  . Lack of Transportation (Non-Medical):  Not on file  Physical Activity:   . Days of Exercise per Week: Not on file  . Minutes of Exercise per Session: Not on file  Stress:   . Feeling of Stress : Not on file  Social Connections:   . Frequency of Communication with Friends and Family: Not on file  . Frequency of Social Gatherings with Friends and Family: Not on file  . Attends Religious Services: Not on file  . Active Member of Clubs or Organizations: Not on file  . Attends Archivist Meetings: Not on file  . Marital Status: Not on file  Intimate Partner Violence:   . Fear of Current or Ex-Partner: Not on file  . Emotionally Abused: Not on file  . Physically Abused: Not on file  . Sexually Abused: Not on file    Physical Exam Cardiovascular:     Rate and Rhythm: Normal rate and regular rhythm.     Pulses: Normal pulses.  Pulmonary:     Effort: Pulmonary effort is normal.     Breath sounds: Normal breath sounds.  Musculoskeletal:        General: Normal range of motion.     Right lower leg: No edema.     Left lower leg: No edema.  Skin:    General: Skin is warm and dry.     Capillary Refill: Capillary refill takes less than 2  seconds.  Neurological:     Mental Status: She is alert and oriented to person, place, and time.  Psychiatric:        Mood and Affect: Mood normal.         Future Appointments  Date Time Provider [place]  06/16/2020 11:30 AM [facility] PA/NP [facility] None    BP 128/90 (BP Location: Left Arm, Patient Position: Sitting, Cuff Size: Large)   Pulse 94   Resp 16   Wt 198 lb (89.8 kg)   SpO2 98%   BMI 39.99 kg/m   Weight yesterday- 197 lb Last visit weight- 201 lb  Ms Mettler was seen at home today and reported feeling well. She denied chest pain, SOB, headache, orthopnea, fever or cough. She continues to have intermittent dizziness which is controlled with meclizine. She stated she has been compliant with her medications over the past two weeks and her weight has been stable. She has missed her past two appointments at the coumadin clinic because she has "a lot going on." I stressed the importance of compliance with these appointments and she was understanding. Her medications were verified and her pillbox was checked for accuracy. She has a HF clinic appointment next week and I will follow up in two weeks.    [name], EMT 06/12/20  ACTION: Home visit completed Next visit planned for 2 weeks

## 2020-06-15 ENCOUNTER — Telehealth (HOSPITAL_COMMUNITY): Payer: Self-pay | Admitting: Cardiology

## 2020-06-15 NOTE — Telephone Encounter (Signed)
Attempted to contact patient for appt reminder No answer LMOM

## 2020-06-16 ENCOUNTER — Other Ambulatory Visit (HOSPITAL_COMMUNITY): Payer: Self-pay | Admitting: Cardiology

## 2020-06-16 ENCOUNTER — Ambulatory Visit (HOSPITAL_COMMUNITY)
Admission: RE | Admit: 2020-06-16 | Discharge: 2020-06-16 | Disposition: A | Discharge status: Home / Self Care | Payer: Medicaid Other | Source: Ambulatory Visit | Attending: Cardiology | Admitting: Cardiology

## 2020-06-16 ENCOUNTER — Other Ambulatory Visit: Payer: Self-pay

## 2020-06-16 ENCOUNTER — Encounter (HOSPITAL_COMMUNITY): Payer: Self-pay

## 2020-06-16 ENCOUNTER — Ambulatory Visit (INDEPENDENT_AMBULATORY_CARE_PROVIDER_SITE_OTHER): Payer: Medicaid Other

## 2020-06-16 VITALS — BP 150/100 | HR 92 | Wt 199.4 lb

## 2020-06-16 DIAGNOSIS — I110 Hypertensive heart disease with heart failure: Secondary | ICD-10-CM | POA: Insufficient documentation

## 2020-06-16 DIAGNOSIS — I428 Other cardiomyopathies: Secondary | ICD-10-CM | POA: Insufficient documentation

## 2020-06-16 DIAGNOSIS — E1165 Type 2 diabetes mellitus with hyperglycemia: Secondary | ICD-10-CM | POA: Diagnosis not present

## 2020-06-16 DIAGNOSIS — I513 Intracardiac thrombosis, not elsewhere classified: Secondary | ICD-10-CM | POA: Diagnosis not present

## 2020-06-16 DIAGNOSIS — Z794 Long term (current) use of insulin: Secondary | ICD-10-CM | POA: Insufficient documentation

## 2020-06-16 DIAGNOSIS — Z79899 Other long term (current) drug therapy: Secondary | ICD-10-CM | POA: Insufficient documentation

## 2020-06-16 DIAGNOSIS — N898 Other specified noninflammatory disorders of vagina: Secondary | ICD-10-CM | POA: Insufficient documentation

## 2020-06-16 DIAGNOSIS — Z5181 Encounter for therapeutic drug level monitoring: Secondary | ICD-10-CM

## 2020-06-16 DIAGNOSIS — I480 Paroxysmal atrial fibrillation: Secondary | ICD-10-CM

## 2020-06-16 DIAGNOSIS — Z8249 Family history of ischemic heart disease and other diseases of the circulatory system: Secondary | ICD-10-CM | POA: Diagnosis not present

## 2020-06-16 DIAGNOSIS — Z833 Family history of diabetes mellitus: Secondary | ICD-10-CM | POA: Insufficient documentation

## 2020-06-16 DIAGNOSIS — I5020 Unspecified systolic (congestive) heart failure: Secondary | ICD-10-CM

## 2020-06-16 DIAGNOSIS — I5023 Acute on chronic systolic (congestive) heart failure: Secondary | ICD-10-CM | POA: Diagnosis not present

## 2020-06-16 DIAGNOSIS — Z888 Allergy status to other drugs, medicaments and biological substances status: Secondary | ICD-10-CM | POA: Diagnosis not present

## 2020-06-16 DIAGNOSIS — I2510 Atherosclerotic heart disease of native coronary artery without angina pectoris: Secondary | ICD-10-CM | POA: Insufficient documentation

## 2020-06-16 DIAGNOSIS — Z886 Allergy status to analgesic agent status: Secondary | ICD-10-CM | POA: Diagnosis not present

## 2020-06-16 DIAGNOSIS — E785 Hyperlipidemia, unspecified: Secondary | ICD-10-CM | POA: Diagnosis not present

## 2020-06-16 DIAGNOSIS — Z7901 Long term (current) use of anticoagulants: Secondary | ICD-10-CM | POA: Diagnosis not present

## 2020-06-16 LAB — BASIC METABOLIC PANEL
Anion gap: 10 (ref 5–15)
BUN: 8 mg/dL (ref 6–20)
CO2: 23 mmol/L (ref 22–32)
Calcium: 8.8 mg/dL — ABNORMAL LOW (ref 8.9–10.3)
Chloride: 101 mmol/L (ref 98–111)
Creatinine, Ser: 0.6 mg/dL (ref 0.44–1.00)
GFR, Estimated: >60 mL/min (ref >60)
Glucose, Bld: 258 mg/dL — ABNORMAL HIGH (ref 70–99)
Potassium: 3.7 mmol/L (ref 3.5–5.1)
Sodium: 134 mmol/L — ABNORMAL LOW (ref 135–145)

## 2020-06-16 LAB — HCG, QUANTITATIVE, PREGNANCY: hCG, Beta Chain, Quant, S: <1 m[IU]/mL (ref <5)

## 2020-06-16 LAB — POCT INR: INR: 1 — AB (ref 2.0–3.0)

## 2020-06-16 LAB — DIGOXIN LEVEL: Digoxin Level: <0.2 ng/mL — ABNORMAL LOW (ref 1.0–2.0)

## 2020-06-16 MED ORDER — FLUCONAZOLE 150 MG PO TABS: 150.0000 mg | ORAL_TABLET | Freq: Every day | ORAL | 0 refills | Status: DC

## 2020-06-16 MED ORDER — ALBUTEROL SULFATE HFA 108 (90 BASE) MCG/ACT IN AERS: 2.0000 | INHALATION_SPRAY | Freq: Four times a day (QID) | RESPIRATORY_TRACT | 1 refills | Status: DC | PRN

## 2020-06-16 MED ORDER — TORSEMIDE 20 MG PO TABS
80.0000 mg | ORAL_TABLET | Freq: Every day | ORAL | 3 refills | Status: DC
Start: 2020-06-16 — End: 2020-09-03

## 2020-06-16 MED FILL — FLUCONAZOLE 150 MG TABS: 150 | 3 days supply | Qty: 3 | Fill #0

## 2020-06-16 MED FILL — PROAIR HFA 90 MCG INHALER: 108 (90 BAS | 25 days supply | Qty: 9 | Fill #0

## 2020-06-16 NOTE — Patient Instructions (Signed)
INCREASE Torsemide to 80 mg (4 tabs) daily START Diflucan 150mg ,one tab daily   Labs today We will only contact you if something comes back abnormal or we need to make some changes. Otherwise no news is good news!  Labs needed in 7-10 days  Your physician recommends that you schedule a follow-up appointment in: 3-4 weeks  in the Advanced Practitioners (PA/NP) Clinic    If you have any questions or concerns before your next appointment please send [place] a message through [place] or call our office at [phone].    TO LEAVE A MESSAGE FOR THE NURSE SELECT OPTION 2, PLEASE LEAVE A MESSAGE INCLUDING: . YOUR NAME . DATE OF BIRTH . CALL BACK NUMBER . REASON FOR CALL**this is important as we prioritize the call backs  YOU WILL RECEIVE A CALL BACK THE SAME DAY AS LONG AS YOU CALL BEFORE 4:00 PM

## 2020-06-16 NOTE — Patient Instructions (Signed)
Description   Take 2 tablets today and tomorrow, then resume same dosage of Warfarin 1.5 tablets daily except for 1 tablet on Sundays and Thursdays. Recheck INR in 10 days. Call coumadin clinic for any questions [phone].   Please Bill Code [id number].

## 2020-06-16 NOTE — Progress Notes (Signed)
ReDS Vest / Clip - 06/16/20 1100      ReDS Vest / Clip   Station Marker B    Ruler Value 28    ReDS Value Range Moderate volume overload    ReDS Actual Value 38    Anatomical Comments sitting

## 2020-06-16 NOTE — Progress Notes (Signed)
Advanced Heart Failure Clinic Note   Referring Physician: PCP: Gifford Shave, MD PCP-Cardiologist: Dr. Aundra Dubin  EP- Dr. Curt Bears   HPI: 44 y.o. with history of type 2 diabetes (poorly controlled, last hgbA1c > 15), poorly controlled HTN, hyperlipidemia, paroxysmal atrial fibrillation, and chronic systolic CHF of uncertain etiology.  Patient had an echo in 6/20 as outpatient due to exertional dyspnea. This showed EF 20-25%, moderate RV dysfunction.  She was also noted to have paroxysmal atrial fibrillation.  She was started on Xarelto.  In 7/20, she was admitted with DKA. In 8/20, she was admitted with CHF exacerbation and diuresed.  She was sent home on Lasix 40 mg po bid.   Readmitted on 9/20 with CHF exacerbation, in the setting of elevated BP.  Diuresed with IV lasix.  LHC showed nonobstructive CAD.  RHC showed R>L heart failure with low PAPI suggesting significant RV dysfunction. She was transitioned to PO torsemide and treated w/ guidelines directed medical therapy w/ Entresto, Spironolactone, Coreg and digoxin. She was also transitioned off of Xarelto and placed on Coumadin.  Readmitted 0/0/9381 with A/C systolic heart failure. She had not been taking her lasix because she says it was on her AVS to stop it.  Diuresed with IV lasix and transitioned to torsemide 40 mg daily. Weight day before discharge was 190 pounds. Echo with LV EF <20%, RV severely decreased systolic function.   Echo in 6/21 showed EF 35%, moderately decreased RV systolic function, moderate LAE, mild MR.  Echo was done today and reviewed, EF 35-40%, global hypokinesis, mild LVH, normal RV, IVC normal.   Recently seen by Dr. Aundra Dubin 11/2 and was fluid overloaded. Wt was up 7 lb. ReDs clip elevated at 40%. She was symptomatic, NYHA Class III. Torsemide was increased to 60 mg daily. She was also in sinus tach but already on max dose of Coreg.  Ivabradine 5 mg bid was added. She was also ordered to get a cMRI to look for  scar/infiltrative disease and quantify EF. This has not been done yet.   She presents back to clinic for f/u. Wt only down 2 lb. ReDs Clip still elevated but down slightly at 38%. EKG shows improved HR. In NSR. HR 84 bpm. BP elevated at 150/100 but she has not yet taken AM meds. Concern that she may have a yeast infection. Notes vaginal irritation/ itching but no dysuria. Has had yeast infections in the past. Also concern that she may be pregnant. She is 7 days late w/ her period. Not using regular birth control.    ECG (personally reviewed, 10/21): NSR, 86 bpm  Labs (5/21): hgbA1c 9.3, BNP 76, K 3.9, creatinine 1.1 Labs (6/21): digoxin 0.4, K 4.1, creatinine 0.77 Labs (10/21): BNP 25, K 4.1, creatinine 0.91  REDS clilp 38% PMH: 1. Type 2 diabetes: H/o DKA 2. HTN 3. OSA 4. Obesity 5. Polycystic ovarian syndrome 6. Chronic systolic CHF: Nonischemic cardiomyopathy - Echo (6/20) with EF 20-25%, moderate RV dysfunction.  - LHC/RHC (9/20): No significant CAD.  - Echo (3/21): EF <20%, moderate LVH with global HK, severely reduced RV systolic function.  - Echo (6/21): EF 35%, moderately decreased RV systolic function, moderate LAE, mild MR. - Echo (11/21): EF 35-40%, global hypokinesis, mild LVH, normal RV, IVC normal.  7. Hyperlipidemia 8. Atrial fibrillation: Paroxysmal.  9. History of LV thrombus.    Current Outpatient Medications  Medication Sig Dispense Refill  . Accu-Chek Softclix Lancets lancets Use as instructed 100 each 12  . Accu-Chek Softclix  Lancets lancets Check cbg 2 times daily 100 each 12  . atorvastatin (LIPITOR) 40 MG tablet TAKE 1 TABLET BY MOUTH DAILY AT 6 PM. 30 tablet 2  . Blood Glucose Monitoring Suppl (ACCU-CHEK AVIVA PLUS) w/Device KIT 1 application by Does not apply route QID. Check fasting sugars as well as throughout the day 1 kit 0  . carvedilol (COREG) 25 MG tablet Take 1 tablet (25 mg total) by mouth 2 (two) times daily with a meal. 180 tablet 3  . digoxin  (LANOXIN) 0.125 MG tablet Take 1 tablet (0.125 mg total) by mouth daily. 30 tablet 3  . empagliflozin (JARDIANCE) 25 MG TABS tablet Take 25 mg by mouth daily. 90 tablet 3  . gabapentin (NEURONTIN) 100 MG capsule Take 100 mg by mouth 2 (two) times daily.    Mar[place] Kitchen glucose blood (ACCU-CHEK AVIVA PLUS) test strip Check CBG 2 times per day 100 each 12  . glucose blood (AGAMATRIX PRESTO TEST) test strip Use as instructed 100 each 12  . hydrOXYzine (ATARAX/VISTARIL) 10 MG tablet Take 10 mg by mouth. Daily    . insulin glargine (LANTUS) 100 UNIT/ML injection Inject 0.22 mLs (22 Units total) into the skin daily. 10 mL 11  . isosorbide-hydrALAZINE (BIDIL) 20-37.5 MG tablet Take 0.5 tablets by mouth 3 (three) times daily. 90 tablet 3  . ivabradine (CORLANOR) 5 MG TABS tablet Take 1 tablet (5 mg total) by mouth 2 (two) times daily with a meal. 60 tablet 3  . Lancets Misc. (ACCU-CHEK SOFTCLIX LANCET DEV) KIT 1 application by Does not apply route 2 (two) times daily. 1 kit 0  . meclizine (ANTIVERT) 12.5 MG tablet Take 1 tablet (12.5 mg total) by mouth 3 (three) times daily as needed for dizziness. (Patient taking differently: Take 12.5 mg by mouth 3 (three) times daily as needed for dizziness. dizziness) 30 tablet 0  . methocarbamol (ROBAXIN) 500 MG tablet Take 1 tablet (500 mg total) by mouth every 8 (eight) hours as needed for muscle spasms. 30 tablet 0  . potassium chloride SA (KLOR-CON) 20 MEQ tablet Take 1 tablet by mouth every Monday Wednesday and Friday    . sacubitril-valsartan (ENTRESTO) 97-103 MG Take 1 tablet by mouth 2 (two) times daily. 180 tablet 3  . sertraline (ZOLOFT) 25 MG tablet Take 1 tablet (25 mg total) by mouth daily. 30 tablet 3  . spironolactone (ALDACTONE) 25 MG tablet Take 1 tablet (25 mg total) by mouth daily. 30 tablet 3  . torsemide (DEMADEX) 20 MG tablet Take 3 tablets (60 mg total) by mouth daily. 90 tablet 3  . traZODone (DESYREL) 50 MG tablet Take 1 tablet (50 mg total) by mouth at  bedtime as needed for sleep. (Patient taking differently: Take 50 mg by mouth at bedtime. ) 30 tablet 0  . warfarin (COUMADIN) 7.5 MG tablet Take as directed by Coumadin Clinic, Needs OV for future refills (Patient taking differently: Take 7.5-11.25 mg by mouth as directed. Take 1.5 tablets (11.25 mg) by mouth daily except for Sundays & Thursdays Take 1 tablet (7.5 mg) directed by Coumadin Clinic, Needs OV for future refills) 45 tablet 0   No current facility-administered medications for this encounter.    Allergies  Allergen Reactions  . Shellfish Allergy Swelling    Airway involvement including EMS - Has Epi-Pen  . Dulaglutide Nausea And Vomiting and Other (See Comments)    Multiple episodes of vomiting over multiple days  . Metformin And Related Diarrhea  . Ibuprofen  Per PCP interferes with daily meds       Social History   Socioeconomic History  . Marital status: Single    Spouse name: Not on file  . Number of children: Not on file  . Years of education: Not on file  . Highest education level: Not on file  Occupational History  . Not on file  Tobacco Use  . Smoking status: Never Smoker  . Smokeless tobacco: Never Used  Vaping Use  . Vaping Use: Never used  Substance and Sexual Activity  . Alcohol use: No    Alcohol/week: 0.0 standard drinks  . Drug use: No  . Sexual activity: Yes  Other Topics Concern  . Not on file  Social History Narrative  . Not on file   Social Determinants of Health   Financial Resource Strain: High Risk  . Difficulty of Paying Living Expenses: Hard  Food Insecurity:   . Worried About Charity fundraiser in the Last Year: Not on file  . Ran Out of Food in the Last Year: Not on file  Transportation Needs:   . Lack of Transportation (Medical): Not on file  . Lack of Transportation (Non-Medical): Not on file  Physical Activity:   . Days of Exercise per Week: Not on file  . Minutes of Exercise per Session: Not on file  Stress:   .  Feeling of Stress : Not on file  Social Connections:   . Frequency of Communication with Friends and Family: Not on file  . Frequency of Social Gatherings with Friends and Family: Not on file  . Attends Religious Services: Not on file  . Active Member of Clubs or Organizations: Not on file  . Attends Archivist Meetings: Not on file  . Marital Status: Not on file  Intimate Partner Violence:   . Fear of Current or Ex-Partner: Not on file  . Emotionally Abused: Not on file  . Physically Abused: Not on file  . Sexually Abused: Not on file      Family History  Problem Relation Age of Onset  . Diabetes Mother   . Hypertension Mother   . Hypertension Father   . Diabetes Father     Vitals:   06/16/20 1142  BP: (!) 150/100  Pulse: 92  SpO2: 97%  Weight: 90.4 kg (199 lb 6.4 oz)   PHYSICAL EXAM: ReDs Clip 38%  General:  Well appearing, moderately obese. No respiratory difficulty HEENT: normal Neck: supple. JVD ~8 cm. Carotids 2+ bilat; no bruits. No lymphadenopathy or thyromegaly appreciated. Cor: PMI nondisplaced. Regular rate & rhythm. No rubs, gallops or murmurs. Lungs: clear Abdomen: soft, nontender, nondistended. No hepatosplenomegaly. No bruits or masses. Good bowel sounds. Extremities: no cyanosis, clubbing, rash, edema Neuro: alert & oriented x 3, cranial nerves grossly intact. moves all 4 extremities w/o difficulty. Affect pleasant.    ASSESSMENT & PLAN:  1. Acute on chronic systolic CHF:  Nonischemic cardiomyopathy, diagnosed in 6/20. Repeat Echo 03/2019 showed EF 20-25%, diffuse hypokinesis, LV thrombus, moderate RV systolic dysfunction, mild MR, moderate TR. Echo in 3/21 with EF <30%, severe RV dysfunction. She has a history of paroxysmal atrial fibrillation but seems to be mostly in sinus rhythm. This is not likely to be a tachycardia-mediated cardiomyopathy. Poorly controlled diabetes itself can cause a cardiomyopathy, as can poorly controlled HTN.  Myocarditis is a potential cause of her cardiomyopathy. [place] 9/20 showed nonobstructive CAD. RHC showed R>L heart failure with low PAPI suggesting significant RV dysfunction.  Echo in 6/21 with EF up to 35%. Echo 11/21 EF 35-40%. Remains mildly fluid overloaded and exam and by ReDs clip, 38%. NYHA class III symptoms - Continue Entresto 97/103 bid.  -  Increase torsemide to 80 mg daily  - Continue Coreg 25 mg bid - Continue ivabradine 5 mg bid. HR still > 70 bpm, but will not increase to 7.5 given h/o afib (NSR on EKG today), HR 86 bpm - Continue spironolactone 25 mg daily.  - Continue digoxin. Check dig level today  - Continue empagliflozin.  - Continue Bidil 1/2 tab tid, would not increase with headaches.   - Continue w/ paramedicine. - Check BMP today and again in 7 days - she has been ordered to get cardiac MRI to look for scar/infiltrative disease and quantify EF.  If EF < 35% on cMRI (and especially if there is significant delayed enhancement), will need to consider ICD. She would not qualify for CRT.  - She previously had been trying to get pregnant (has been difficult with polycystic ovarian syndrome). She has 2 grown children already.With her cardiomyopathy and active CHF, pregnancy would create a significant mortality risk for her. A number of her cardiac meds to control her CHF are feto-toxic. She reports that she is 7 days late w/ her period - check serum hCG today  - discussed importance of strict regular birth control/ use of condoms to avoid pregnancy   2. 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  - Compliant w/ coumadin.  3. Atrial fibrillation: Paroxysmal. NSR today.  - Continue warfarin (using warfarin rather than DOAC with h/o LV thrombus).  INRs followed by PCP - Would consider atrial fibrillation ablation in the future, if recurrence, given [facility]-HF data.  4. Type 2 diabetes: per PCP.   5. Hypertension: elevated today but she has not yet taken AM meds - continue  current regimen outlined above 6. LV thrombus: She is on warfarin. 7. ? Yeast Infection   - Concern that she may have a yeast infection. Notes vaginal irritation/ itching but no dysuria. Has had yeast infections in the past. - she is on Jardiance for both systolic heart failure and DM.  - will treat w/ Diflucan 150 mg x 3 days.  - will continue Jardiance for now. Hopefully occurrences will be few   C/w paramedicine. F/u in clinic in 3-4 weeks.   Lyda Jester, PA-C 06/16/20

## 2020-06-22 MED FILL — TORSEMIDE 20 MG TABLET: 20 | 30 days supply | Qty: 120 | Fill #0

## 2020-06-24 ENCOUNTER — Ambulatory Visit (HOSPITAL_COMMUNITY)
Admission: RE | Admit: 2020-06-24 | Discharge: 2020-06-24 | Disposition: A | Discharge status: Home / Self Care | Payer: Medicaid Other | Source: Ambulatory Visit | Attending: Internal Medicine | Admitting: Internal Medicine

## 2020-06-24 ENCOUNTER — Other Ambulatory Visit: Payer: Self-pay | Admitting: Cardiology

## 2020-06-24 ENCOUNTER — Other Ambulatory Visit: Payer: Self-pay

## 2020-06-24 ENCOUNTER — Ambulatory Visit (INDEPENDENT_AMBULATORY_CARE_PROVIDER_SITE_OTHER): Payer: Medicaid Other | Admitting: *Deleted

## 2020-06-24 DIAGNOSIS — I5020 Unspecified systolic (congestive) heart failure: Secondary | ICD-10-CM

## 2020-06-24 DIAGNOSIS — I480 Paroxysmal atrial fibrillation: Secondary | ICD-10-CM

## 2020-06-24 LAB — BASIC METABOLIC PANEL
Anion gap: 9 (ref 5–15)
BUN: 9 mg/dL (ref 6–20)
CO2: 23 mmol/L (ref 22–32)
Calcium: 8.8 mg/dL — ABNORMAL LOW (ref 8.9–10.3)
Chloride: 101 mmol/L (ref 98–111)
Creatinine, Ser: 0.72 mg/dL (ref 0.44–1.00)
GFR, Estimated: >60 mL/min (ref >60)
Glucose, Bld: 323 mg/dL — ABNORMAL HIGH (ref 70–99)
Potassium: 3.8 mmol/L (ref 3.5–5.1)
Sodium: 133 mmol/L — ABNORMAL LOW (ref 135–145)

## 2020-06-24 LAB — POCT INR: INR: 1.8 — AB (ref 2.0–3.0)

## 2020-06-24 MED ORDER — WARFARIN SODIUM 7.5 MG PO TABS
ORAL_TABLET | ORAL | 0 refills | Status: DC
Start: 2020-06-24 — End: 2020-06-24

## 2020-06-24 MED ORDER — WARFARIN SODIUM 7.5 MG PO TABS
ORAL_TABLET | ORAL | 0 refills | Status: DC
Start: 2020-06-24 — End: 2020-10-22

## 2020-06-24 MED FILL — WARFARIN SODIUM 7.5 MG TAB: 7.5 | 30 days supply | Qty: 45 | Fill #0

## 2020-06-24 NOTE — Patient Instructions (Signed)
Description   Take 2 tablets today and then start taking 1.5 tablets daily except for 1 tablet on Thursdays.  Recheck INR in 10 days. Call coumadin clinic for any questions [phone].   Please [place] Code [id number].

## 2020-06-30 ENCOUNTER — Other Ambulatory Visit: Payer: Self-pay | Admitting: Obstetrics and Gynecology

## 2020-06-30 NOTE — Patient Outreach (Signed)
Care Coordination  06/30/2020  ORAL HALLGREN 1976-03-11 [id number]   Medicaid Managed Care   Unsuccessful Outreach Note  06/30/2020 Name: [name] MRN: [id number] DOB: 03-01-1976  Referred by: Gifford Shave, MD Reason for referral : High Risk Managed Medicaid (initial-follow up)   An unsuccessful telephone outreach was attempted today. The patient was referred to the case management team for assistance with care management and care coordination.   Follow Up Plan: RNCM will follow up with patient within 7-14 days.  Aida Raider RN, BSN [place]  [facility] - Managed Medicaid High Risk [phone]

## 2020-06-30 NOTE — Patient Instructions (Signed)
Hi [name], sorry we missed you today - as a part of your Medicaid benefit, you are eligible for care management and care coordination services at no cost or copay. I was unable to reach you by phone today but would be happy to help you with your health related needs. Please feel free to call me at [phone].  A member of the Managed Medicaid care management team will reach out to you again over the next 7-14 days.   Aida Raider RN, BSN [place]  [facility] - Managed Medicaid High Risk [phone].

## 2020-07-02 MED FILL — TORSEMIDE 20 MG TABLET: 20 | 30 days supply | Qty: 120 | Fill #0

## 2020-07-02 MED FILL — WARFARIN SODIUM 7.5 MG TAB: 7.5 | 30 days supply | Qty: 45 | Fill #0

## 2020-07-03 ENCOUNTER — Other Ambulatory Visit (HOSPITAL_COMMUNITY): Payer: Self-pay

## 2020-07-03 NOTE — Progress Notes (Signed)
Paramedicine Encounter    Patient ID: [name], female    DOB: June 25, 1976, 44 y.o.   MRN: [id number]   Patient Care Team: Gifford Shave, MD as PCP - General (Family Medicine) Larey Dresser, MD as PCP - Cardiology (Cardiology) Constance Haw, MD as PCP - Electrophysiology (Cardiology) [name], LCSW as Social Worker (Licensed Clinical Social Worker)  Patient Active Problem List   Diagnosis Date Noted  . Hypokalemia 10/13/2019  . Hyponatremia 10/13/2019  . Encounter for therapeutic drug monitoring 10/07/2019  . Acute on chronic congestive heart failure ([place])   . Hypertensive urgency 09/30/2019  . Elevated troponin 09/30/2019  . Hypertensive emergency 09/30/2019  . Attempting to conceive 03/10/2019  . Acute on chronic systolic CHF (congestive heart failure) ([place]) 02/19/2019  . Asthma   . Yeast infection 02/06/2019  . Right ventricular failure ([place]) 02/06/2019  . Morbid obesity ([place]) 02/06/2019  . HFrEF (heart failure with reduced ejection fraction) ([place]) 12/31/2018  . Paroxysmal atrial fibrillation ([place]) 12/31/2018  . Anxiety 12/31/2018  . Hyperlipidemia associated with type 2 diabetes mellitus ([place]) 10/30/2017  . Obstructive sleep apnea 06/30/2015  . Anemia due to blood loss, chronic 06/27/2012  . Allergic rhinitis 06/27/2012  . Major depressive disorder 02/08/2012  . Hypertension associated with diabetes ([place]) 12/04/2011  . Dysmenorrhea 11/22/2011  . Vaginal yeast infection 05/19/2011  . DM2 (diabetes mellitus, type 2) ([place]) 05/19/2011  . PCOS (polycystic ovarian syndrome) 05/19/2011  . Hydrosalpinx 08/21/2009    Current Outpatient Medications:  .  Accu-Chek Softclix Lancets lancets, Use as instructed, Disp: 100 each, Rfl: 12 .  Accu-Chek Softclix Lancets lancets, Check cbg 2 times daily, Disp: 100 each, Rfl: 12 .  albuterol (VENTOLIN HFA) 108 (90 Base) MCG/ACT inhaler, Inhale 2 puffs into the lungs every 6 (six) hours as needed for wheezing or  shortness of breath., Disp: 18 g, Rfl: 1 .  atorvastatin (LIPITOR) 40 MG tablet, TAKE 1 TABLET BY MOUTH DAILY AT 6 PM., Disp: 30 tablet, Rfl: 2 .  Blood Glucose Monitoring Suppl (ACCU-CHEK AVIVA PLUS) w/Device KIT, 1 application by Does not apply route QID. Check fasting sugars as well as throughout the day, Disp: 1 kit, Rfl: 0 .  carvedilol (COREG) 25 MG tablet, Take 1 tablet (25 mg total) by mouth 2 (two) times daily with a meal., Disp: 180 tablet, Rfl: 3 .  digoxin (LANOXIN) 0.125 MG tablet, Take 1 tablet (0.125 mg total) by mouth daily., Disp: 30 tablet, Rfl: 3 .  empagliflozin (JARDIANCE) 25 MG TABS tablet, Take 25 mg by mouth daily., Disp: 90 tablet, Rfl: 3 .  gabapentin (NEURONTIN) 100 MG capsule, Take 100 mg by mouth 2 (two) times daily., Disp: , Rfl:  .  glucose blood (ACCU-CHEK AVIVA PLUS) test strip, Check CBG 2 times per day, Disp: 100 each, Rfl: 12 .  glucose blood (AGAMATRIX PRESTO TEST) test strip, Use as instructed, Disp: 100 each, Rfl: 12 .  hydrOXYzine (ATARAX/VISTARIL) 10 MG tablet, Take 10 mg by mouth. Daily, Disp: , Rfl:  .  insulin glargine (LANTUS) 100 UNIT/ML injection, Inject 0.22 mLs (22 Units total) into the skin daily., Disp: 10 mL, Rfl: 11 .  isosorbide-hydrALAZINE (BIDIL) 20-37.5 MG tablet, Take 0.5 tablets by mouth 3 (three) times daily., Disp: 90 tablet, Rfl: 3 .  ivabradine (CORLANOR) 5 MG TABS tablet, Take 1 tablet (5 mg total) by mouth 2 (two) times daily with a meal., Disp: 60 tablet, Rfl: 3 .  Lancets Misc. (ACCU-CHEK SOFTCLIX LANCET DEV) KIT,  1 application by Does not apply route 2 (two) times daily., Disp: 1 kit, Rfl: 0 .  meclizine (ANTIVERT) 12.5 MG tablet, Take 1 tablet (12.5 mg total) by mouth 3 (three) times daily as needed for dizziness. (Patient taking differently: Take 12.5 mg by mouth 3 (three) times daily as needed for dizziness. dizziness), Disp: 30 tablet, Rfl: 0 .  methocarbamol (ROBAXIN) 500 MG tablet, Take 1 tablet (500 mg total) by mouth every 8  (eight) hours as needed for muscle spasms., Disp: 30 tablet, Rfl: 0 .  potassium chloride SA (KLOR-CON) 20 MEQ tablet, Take 1 tablet by mouth every Monday Wednesday and Friday, Disp: , Rfl:  .  sacubitril-valsartan (ENTRESTO) 97-103 MG, Take 1 tablet by mouth 2 (two) times daily., Disp: 180 tablet, Rfl: 3 .  sertraline (ZOLOFT) 25 MG tablet, Take 1 tablet (25 mg total) by mouth daily., Disp: 30 tablet, Rfl: 3 .  spironolactone (ALDACTONE) 25 MG tablet, Take 1 tablet (25 mg total) by mouth daily., Disp: 30 tablet, Rfl: 3 .  torsemide (DEMADEX) 20 MG tablet, Take 4 tablets (80 mg total) by mouth daily., Disp: 120 tablet, Rfl: 3 .  traZODone (DESYREL) 50 MG tablet, Take 1 tablet (50 mg total) by mouth at bedtime as needed for sleep. (Patient taking differently: Take 50 mg by mouth at bedtime. ), Disp: 30 tablet, Rfl: 0 .  warfarin (COUMADIN) 7.5 MG tablet, Take as directed by Coumadin Clinic, Disp: 45 tablet, Rfl: 0 Allergies  Allergen Reactions  . Shellfish Allergy Swelling    Airway involvement including EMS - Has Epi-Pen  . Dulaglutide Nausea And Vomiting and Other (See Comments)    Multiple episodes of vomiting over multiple days  . Metformin And Related Diarrhea  . Ibuprofen     Per PCP interferes with daily meds       Social History   Socioeconomic History  . Marital status: Single    Spouse name: Not on file  . Number of children: Not on file  . Years of education: Not on file  . Highest education level: Not on file  Occupational History  . Not on file  Tobacco Use  . Smoking status: Never Smoker  . Smokeless tobacco: Never Used  Vaping Use  . Vaping Use: Never used  Substance and Sexual Activity  . Alcohol use: No    Alcohol/week: 0.0 standard drinks  . Drug use: No  . Sexual activity: Yes  Other Topics Concern  . Not on file  Social History Narrative  . Not on file   Social Determinants of Health   Financial Resource Strain: High Risk  . Difficulty of Paying  Living Expenses: Hard  Food Insecurity: Not on file  Transportation Needs: Not on file  Physical Activity: Not on file  Stress: Not on file  Social Connections: Not on file  Intimate Partner Violence: Not on file    Physical Exam Cardiovascular:     Rate and Rhythm: Normal rate and regular rhythm.     Pulses: Normal pulses.  Pulmonary:     Effort: Pulmonary effort is normal.     Breath sounds: Normal breath sounds.  Musculoskeletal:        General: Normal range of motion.     Right lower leg: No edema.     Left lower leg: No edema.  Skin:    General: Skin is warm and dry.     Capillary Refill: Capillary refill takes less than 2 seconds.  Neurological:  Mental Status: She is alert and oriented to person, place, and time.  Psychiatric:        Mood and Affect: Mood normal.         Future Appointments  Date Time Provider [place]  07/06/2020  1:45 PM [facility] CARE MANAGER 2 [facility] None  07/08/2020 10:30 AM [facility] PA/NP [facility] None  07/08/2020 12:45 PM [facility]    BP 124/82 (BP Location: Left Arm, Patient Position: Sitting, Cuff Size: Large)   Pulse 98   Resp 16   Wt 195 lb (88.5 kg)   SpO2 97%   BMI 39.39 kg/m   Weight yesterday- 195 lb Last visit weight- 199 lb  [name] was seen at home today and reported feeling generally well. She denied having chest pain, SOB, headache, dizziness, orthopnea, fever or cough over the past week. She stated she has been compliant with her medications but is missing her insulin regularly. We had a conversation regarding the need for full medications compliance and she was understanding and agreeable. I will follow up in two weeks.   [name], EMT 07/03/20  ACTION: Home visit completed Next visit planned for 2 weeks

## 2020-07-06 ENCOUNTER — Other Ambulatory Visit: Payer: Self-pay | Admitting: Obstetrics and Gynecology

## 2020-07-06 NOTE — Patient Instructions (Signed)
Hi [name], sorry we missed you today.  - as a part of your Medicaid benefit, you are eligible for care management and care coordination services at no cost or copay. I was unable to reach you by phone today but would be happy to help you with your health related needs. Please feel free to call me at [phone].  A member of the Managed Medicaid care management team will reach out to you again over the next 7-14 days.   Aida Raider RN, BSN [place]  [facility] - Managed Medicaid High Risk [phone].

## 2020-07-06 NOTE — Patient Outreach (Signed)
Care Coordination  07/06/2020  [name] 1975/11/08 [id number]    Medicaid Managed Care   Unsuccessful Outreach Note  07/06/2020 Name: [name] MRN: [id number] DOB: 03/14/1976  Referred by: Gifford Shave, MD Reason for referral : High Risk Managed Medicaid (Unsuccessful telephone outreach)   A second unsuccessful telephone outreach was attempted today. The patient was referred to the case management team for assistance with care management and care coordination.   Follow Up Plan: RNCM will follow up with patient within 7-14 days.  Aida Raider RN, BSN [name]  [facility] - Managed Medicaid High Risk [phone].

## 2020-07-08 ENCOUNTER — Other Ambulatory Visit: Payer: Self-pay | Admitting: Family Medicine

## 2020-07-08 ENCOUNTER — Ambulatory Visit (HOSPITAL_COMMUNITY)
Admission: RE | Admit: 2020-07-08 | Discharge: 2020-07-08 | Disposition: A | Discharge status: Home / Self Care | Payer: Medicaid Other | Source: Ambulatory Visit | Attending: Adult Health | Admitting: Adult Health

## 2020-07-08 ENCOUNTER — Ambulatory Visit (INDEPENDENT_AMBULATORY_CARE_PROVIDER_SITE_OTHER): Payer: Medicaid Other | Admitting: *Deleted

## 2020-07-08 ENCOUNTER — Other Ambulatory Visit: Payer: Self-pay

## 2020-07-08 VITALS — BP 135/87 | HR 99 | Wt 200.0 lb

## 2020-07-08 DIAGNOSIS — E282 Polycystic ovarian syndrome: Secondary | ICD-10-CM | POA: Diagnosis not present

## 2020-07-08 DIAGNOSIS — Z888 Allergy status to other drugs, medicaments and biological substances status: Secondary | ICD-10-CM | POA: Diagnosis not present

## 2020-07-08 DIAGNOSIS — I480 Paroxysmal atrial fibrillation: Secondary | ICD-10-CM | POA: Diagnosis not present

## 2020-07-08 DIAGNOSIS — Z79899 Other long term (current) drug therapy: Secondary | ICD-10-CM | POA: Insufficient documentation

## 2020-07-08 DIAGNOSIS — I2510 Atherosclerotic heart disease of native coronary artery without angina pectoris: Secondary | ICD-10-CM | POA: Insufficient documentation

## 2020-07-08 DIAGNOSIS — E1165 Type 2 diabetes mellitus with hyperglycemia: Secondary | ICD-10-CM | POA: Diagnosis not present

## 2020-07-08 DIAGNOSIS — E785 Hyperlipidemia, unspecified: Secondary | ICD-10-CM | POA: Insufficient documentation

## 2020-07-08 DIAGNOSIS — Z886 Allergy status to analgesic agent status: Secondary | ICD-10-CM | POA: Diagnosis not present

## 2020-07-08 DIAGNOSIS — Z7189 Other specified counseling: Secondary | ICD-10-CM | POA: Diagnosis not present

## 2020-07-08 DIAGNOSIS — Z794 Long term (current) use of insulin: Secondary | ICD-10-CM | POA: Insufficient documentation

## 2020-07-08 DIAGNOSIS — Z8249 Family history of ischemic heart disease and other diseases of the circulatory system: Secondary | ICD-10-CM | POA: Insufficient documentation

## 2020-07-08 DIAGNOSIS — B379 Candidiasis, unspecified: Secondary | ICD-10-CM | POA: Diagnosis not present

## 2020-07-08 DIAGNOSIS — Z596 Low income: Secondary | ICD-10-CM | POA: Insufficient documentation

## 2020-07-08 DIAGNOSIS — Z713 Dietary counseling and surveillance: Secondary | ICD-10-CM | POA: Diagnosis not present

## 2020-07-08 DIAGNOSIS — I5022 Chronic systolic (congestive) heart failure: Secondary | ICD-10-CM | POA: Diagnosis not present

## 2020-07-08 DIAGNOSIS — E119 Type 2 diabetes mellitus without complications: Secondary | ICD-10-CM | POA: Diagnosis not present

## 2020-07-08 DIAGNOSIS — I513 Intracardiac thrombosis, not elsewhere classified: Secondary | ICD-10-CM | POA: Insufficient documentation

## 2020-07-08 DIAGNOSIS — Z7984 Long term (current) use of oral hypoglycemic drugs: Secondary | ICD-10-CM | POA: Insufficient documentation

## 2020-07-08 DIAGNOSIS — Z7901 Long term (current) use of anticoagulants: Secondary | ICD-10-CM | POA: Insufficient documentation

## 2020-07-08 DIAGNOSIS — I110 Hypertensive heart disease with heart failure: Secondary | ICD-10-CM | POA: Insufficient documentation

## 2020-07-08 DIAGNOSIS — I5020 Unspecified systolic (congestive) heart failure: Secondary | ICD-10-CM | POA: Diagnosis not present

## 2020-07-08 DIAGNOSIS — Z5181 Encounter for therapeutic drug level monitoring: Secondary | ICD-10-CM

## 2020-07-08 DIAGNOSIS — I10 Essential (primary) hypertension: Secondary | ICD-10-CM

## 2020-07-08 LAB — BASIC METABOLIC PANEL
Anion gap: 11 (ref 5–15)
BUN: 8 mg/dL (ref 6–20)
CO2: 22 mmol/L (ref 22–32)
Calcium: 9 mg/dL (ref 8.9–10.3)
Chloride: 99 mmol/L (ref 98–111)
Creatinine, Ser: 0.83 mg/dL (ref 0.44–1.00)
GFR, Estimated: >60 mL/min (ref >60)
Glucose, Bld: 306 mg/dL — ABNORMAL HIGH (ref 70–99)
Potassium: 4.2 mmol/L (ref 3.5–5.1)
Sodium: 132 mmol/L — ABNORMAL LOW (ref 135–145)

## 2020-07-08 LAB — CBC
HCT: 37.5 % (ref 36.0–46.0)
Hemoglobin: 11.4 g/dL — ABNORMAL LOW (ref 12.0–15.0)
MCH: 23.8 pg — ABNORMAL LOW (ref 26.0–34.0)
MCHC: 30.4 g/dL (ref 30.0–36.0)
MCV: 78.1 fL — ABNORMAL LOW (ref 80.0–100.0)
Platelets: 397 10*3/uL (ref 150–400)
RBC: 4.8 MIL/uL (ref 3.87–5.11)
RDW: 16.6 % — ABNORMAL HIGH (ref 11.5–15.5)
WBC: 9.8 10*3/uL (ref 4.0–10.5)
nRBC: 0 % (ref 0.0–0.2)

## 2020-07-08 LAB — POCT INR: INR: 1.8 — AB (ref 2.0–3.0)

## 2020-07-08 MED FILL — hydrOXYzine HCL 10 MG TABS: 10 | 10 days supply | Qty: 30 | Fill #0

## 2020-07-08 MED FILL — traZODone HCL 50 MG TABS: 50 | 30 days supply | Qty: 30 | Fill #0

## 2020-07-08 NOTE — Addendum Note (Signed)
Addended by: Derrel Nip B on: 07/08/2020 02:04 PM   Modules accepted: Level of Service

## 2020-07-08 NOTE — Progress Notes (Signed)
Advanced Heart Failure Clinic Note   Referring Physician: PCP: Gifford Shave, MD PCP-Cardiologist: Dr. Aundra Dubin  EP- Dr. Curt Bears   HPI: 44 y.o. with history of type 2 diabetes (poorly controlled, last hgbA1c > 15), poorly controlled HTN, hyperlipidemia, paroxysmal atrial fibrillation, and chronic systolic CHF of uncertain etiology.  Patient had an echo in 6/20 as outpatient due to exertional dyspnea. This showed EF 20-25%, moderate RV dysfunction.  She was also noted to have paroxysmal atrial fibrillation.  She was started on Xarelto.  In 7/20, she was admitted with DKA. In 8/20, she was admitted with CHF exacerbation and diuresed.  She was sent home on Lasix 40 mg po bid.   Readmitted on 9/20 with CHF exacerbation, in the setting of elevated BP.  Diuresed with IV lasix.  LHC showed nonobstructive CAD.  RHC showed R>L heart failure with low PAPI suggesting significant RV dysfunction. She was transitioned to PO torsemide and treated w/ guidelines directed medical therapy w/ Entresto, Spironolactone, Coreg and digoxin. She was also transitioned off of Xarelto and placed on Coumadin.  Readmitted 9/0/3009 with A/C systolic heart failure. She had not been taking her lasix because she says it was on her AVS to stop it.  Diuresed with IV lasix and transitioned to torsemide 40 mg daily. Weight day before discharge was 190 pounds. Echo with LV EF <20%, RV severely decreased systolic function.   Echo in 6/21 showed EF 35%, moderately decreased RV systolic function, moderate LAE, mild MR.  Echo was done today and reviewed, EF 35-40%, global hypokinesis, mild LVH, normal RV, IVC normal.   Recently seen by Dr. Aundra Dubin 11/2 and was fluid overloaded. Wt was up 7 lb. ReDs clip elevated at 40%. She was symptomatic, NYHA Class III. Torsemide was increased to 60 mg daily. She was also in sinus tach but already on max dose of Coreg.  Ivabradine 5 mg bid was added. She was also ordered to get a cMRI to look for  scar/infiltrative disease and quantify EF. This has not been done yet.   F/u on 06/16/20. Wt only down 2 lb. ReDs Clip still elevated but down slightly at 38%. EKG shows improved HR. In NSR. HR 84 bpm. BP elevated at 150/100 but she has not yet taken AM meds. Concern that she may have a yeast infection. Notes vaginal irritation/ itching but no dysuria. Has had yeast infections in the past. Also concern that she may be pregnant. She is 7 days late w/ her period. Not using regular birth control.   Presents back to clinic for follow up. Feeling bad today, SOB with activity and sometimes at rest, also with more fatigue. Feels like she is close to having to go to the ED.  Does not feel increase in torsemide last visit has helped. Struggling with diminished appetite. No CP, dizziness, orthopnea/PND. Weights at home ~195-199 lbs, not checking BP at home. No bleeding issues, INRs checked with PCP. Sexually active, not using birth control. Taking all medications as directed.  Labs (5/21): hgbA1c 9.3, BNP 76, K 3.9, creatinine 1.1 Labs (6/21): digoxin 0.4, K 4.1, creatinine 0.77 Labs (10/21): BNP 25, K 4.1, creatinine 0.91 Labs (11/21): K 3.7, creatinine 0.6, digoxin <0.2 Labs (12/21): K 4.2, creatinine 0.83  PMH: 1. Type 2 diabetes: H/o DKA 2. HTN 3. OSA 4. Obesity 5. Polycystic ovarian syndrome 6. Chronic systolic CHF: Nonischemic cardiomyopathy - Echo (6/20) with EF 20-25%, moderate RV dysfunction.  - LHC/RHC (9/20): No significant CAD.  - Echo (3/21): EF <  20%, moderate LVH with global HK, severely reduced RV systolic function.  - Echo (6/21): EF 35%, moderately decreased RV systolic function, moderate LAE, mild MR. - Echo (11/21): EF 35-40%, global hypokinesis, mild LVH, normal RV, IVC normal.  7. Hyperlipidemia 8. Atrial fibrillation: Paroxysmal.  9. History of LV thrombus.    Current Outpatient Medications  Medication Sig Dispense Refill  . Accu-Chek Softclix Lancets lancets Use as  instructed 100 each 12  . Accu-Chek Softclix Lancets lancets Check cbg 2 times daily 100 each 12  . albuterol (VENTOLIN HFA) 108 (90 Base) MCG/ACT inhaler Inhale 2 puffs into the lungs every 6 (six) hours as needed for wheezing or shortness of breath. 18 g 1  . atorvastatin (LIPITOR) 40 MG tablet TAKE 1 TABLET BY MOUTH DAILY AT 6 PM. 30 tablet 2  . Blood Glucose Monitoring Suppl (ACCU-CHEK AVIVA PLUS) w/Device KIT 1 application by Does not apply route QID. Check fasting sugars as well as throughout the day 1 kit 0  . carvedilol (COREG) 25 MG tablet Take 1 tablet (25 mg total) by mouth 2 (two) times daily with a meal. 180 tablet 3  . digoxin (LANOXIN) 0.125 MG tablet Take 1 tablet (0.125 mg total) by mouth daily. 30 tablet 3  . empagliflozin (JARDIANCE) 25 MG TABS tablet Take 25 mg by mouth daily. 90 tablet 3  . gabapentin (NEURONTIN) 100 MG capsule Take 100 mg by mouth 2 (two) times daily.    Marland Kitchen glucose blood (ACCU-CHEK AVIVA PLUS) test strip Check CBG 2 times per day 100 each 12  . glucose blood (AGAMATRIX PRESTO TEST) test strip Use as instructed 100 each 12  . hydrOXYzine (ATARAX/VISTARIL) 10 MG tablet TAKE 1 TABLET (10 MG TOTAL) BY MOUTH 3 (THREE) TIMES DAILY AS NEEDED. 30 tablet 0  . insulin glargine (LANTUS) 100 UNIT/ML injection Inject 0.22 mLs (22 Units total) into the skin daily. 10 mL 11  . isosorbide-hydrALAZINE (BIDIL) 20-37.5 MG tablet Take 0.5 tablets by mouth 3 (three) times daily. 90 tablet 3  . ivabradine (CORLANOR) 5 MG TABS tablet Take 1 tablet (5 mg total) by mouth 2 (two) times daily with a meal. 60 tablet 3  . Lancets Misc. (ACCU-CHEK SOFTCLIX LANCET DEV) KIT 1 application by Does not apply route 2 (two) times daily. 1 kit 0  . meclizine (ANTIVERT) 12.5 MG tablet Take 1 tablet (12.5 mg total) by mouth 3 (three) times daily as needed for dizziness. (Patient taking differently: Take 12.5 mg by mouth 3 (three) times daily as needed for dizziness. dizziness) 30 tablet 0  .  methocarbamol (ROBAXIN) 500 MG tablet Take 500 mg by mouth every 6 (six) hours as needed.    . potassium chloride SA (KLOR-CON) 20 MEQ tablet Take 1 tablet by mouth every Monday Wednesday and Friday    . sacubitril-valsartan (ENTRESTO) 97-103 MG Take 1 tablet by mouth 2 (two) times daily. 180 tablet 3  . sertraline (ZOLOFT) 25 MG tablet Take 1 tablet (25 mg total) by mouth daily. 30 tablet 3  . spironolactone (ALDACTONE) 25 MG tablet Take 25 mg by mouth daily.    Marland Kitchen torsemide (DEMADEX) 20 MG tablet Take 4 tablets (80 mg total) by mouth daily. 120 tablet 3  . traZODone (DESYREL) 50 MG tablet TAKE 1 TABLET (50 MG TOTAL) BY MOUTH AT BEDTIME AS NEEDED FOR SLEEP. 30 tablet 0  . warfarin (COUMADIN) 7.5 MG tablet Take as directed by Coumadin Clinic 45 tablet 0   No current facility-administered medications for this  encounter.    Allergies  Allergen Reactions  . Shellfish Allergy Swelling    Airway involvement including EMS - Has Epi-Pen  . Dulaglutide Nausea And Vomiting and Other (See Comments)    Multiple episodes of vomiting over multiple days  . Metformin And Related Diarrhea  . Ibuprofen     Per PCP interferes with daily meds       Social History   Socioeconomic History  . Marital status: Single    Spouse name: Not on file  . Number of children: Not on file  . Years of education: Not on file  . Highest education level: Not on file  Occupational History  . Not on file  Tobacco Use  . Smoking status: Never Smoker  . Smokeless tobacco: Never Used  Vaping Use  . Vaping Use: Never used  Substance and Sexual Activity  . Alcohol use: No    Alcohol/week: 0.0 standard drinks  . Drug use: No  . Sexual activity: Yes  Other Topics Concern  . Not on file  Social History Narrative  . Not on file   Social Determinants of Health   Financial Resource Strain: High Risk  . Difficulty of Paying Living Expenses: Hard  Food Insecurity: Not on file  Transportation Needs: Not on file   Physical Activity: Not on file  Stress: Not on file  Social Connections: Not on file  Intimate Partner Violence: Not on file      Family History  Problem Relation Age of Onset  . Diabetes Mother   . Hypertension Mother   . Hypertension Father   . Diabetes Father     Vitals:   07/08/20 1039  BP: 135/87  Pulse: 99  SpO2: 97%  Weight: 90.7 kg (200 lb)    Wt Readings from Last 3 Encounters:  07/08/20 90.7 kg (200 lb)  07/03/20 88.5 kg (195 lb)  06/16/20 90.4 kg (199 lb 6.4 oz)    PHYSICAL EXAM:  ECG: ST 110 bpm (personally reviewed) ReDs Clip:  36%   General: NAD HEENT: Normal.  Neck: Thick, JVP difficult but does not appear elevated, no thyromegaly or thyroid nodule.  Lungs: Clear to auscultation bilaterally with normal respiratory effort. CV: Nondisplaced PMI.  Heart regular S1/S2, no S3/S4, no murmur.  No peripheral edema.  No carotid bruit.  Normal pedal pulses.  Abdomen: Obese, soft, nontender, no hepatosplenomegaly, no distention.  Skin: Intact without lesions or rashes.  Neurologic: Alert and oriented x 3.  Psych: Flat affect. Extremities: No clubbing or cyanosis.    ASSESSMENT & PLAN:  1.Chronic systolic CHF:  Nonischemic cardiomyopathy, diagnosed in 6/20. Repeat Echo 03/2019 showed EF 20-25%, diffuse hypokinesis, LV thrombus, moderate RV systolic dysfunction, mild MR, moderate TR. Echo in 3/21 with EF <30%, severe RV dysfunction. She has a history of paroxysmal atrial fibrillation but seems to be mostly in sinus rhythm. This is not likely to be a tachycardia-mediated cardiomyopathy. Poorly controlled diabetes itself can cause a cardiomyopathy, as can poorly controlled HTN. Myocarditis is a potential cause of her cardiomyopathy. [place] 9/20 showed nonobstructive CAD. RHC showed R>L heart failure with low PAPI suggesting significant RV dysfunction.  Echo in 6/21 with EF up to 35%. Echo 11/21 EF 35-40%.  - Fluid OK today by exam and by ReDs clip, 36%. NYHA class  III symptoms. - Continue Entresto 97/103 bid.  - Continue torsemide to 80 mg daily.  - Continue carvedilol 25 mg bid. - Continue ivabradine 5 mg bid. (HR still > 70 bpm,  but will not increase to 7.5 given h/o afib (ST on EKG today) - Continue spironolactone 25 mg daily.  - Continue digoxin. Last level <0.2 on 06/16/20. - Continue empagliflozin.  - Continue Bidil 1/2 tab tid, would not increase with headaches.   - Continue w/ paramedicine. - Check BMET & CBC today.  - Schedule CPX- unclear if HF driving fatigue vs. deconditioning and body habitus. Discussed CPX with patient to help guide her treatments. She is agreeable.  - she has been ordered to get cardiac MRI to look for scar/infiltrative disease and quantify EF.  If EF < 35% on cMRI (and especially if there is significant delayed enhancement), will need to consider ICD. She would not qualify for CRT.  - She previously had been trying to get pregnant (has been difficult with polycystic ovarian syndrome). She has 2 grown children already.With her cardiomyopathy and active CHF, pregnancy would create a significant mortality risk for her. A number of her cardiac meds to control her CHF are feto-toxic.  - discussed importance of strict regular birth control/ use of condoms to avoid pregnancy    2. LV thrombus - LV thrombus noted on echo 9/20.  - Compliant w/ coumadin.  - INRs followed by PCP.  3. Atrial fibrillation  - Paroxysmal. ST today.  - Continue warfarin (using warfarin rather than DOAC with h/o LV thrombus).  - Would consider atrial fibrillation ablation in the future, if recurrence, given CASTLE-HF data.   4. Type 2 diabetes  - per PCP.  - Continue Jardiance + insulin glargine. - Last A1c 9.3 on 5/21.   5. Hypertension  - Better today, encouraged home BP checks. - Continue current regimen outlined above.  6. Yeast Infection  - Resolved with fluconazole.  - Continue Jardiance for both systolic heart failure and DM.    C/w paramedicine. F/u with Dr. Aundra Dubin in 6-8 weeks after CPX & cMRI. Referred to [facility], per patient request.  Greater than 50% of the (total minutes 25) visit spent in counseling/coordination of care regarding dietary choices and importance of tighter blood sugar control.     [place], FNP 07/08/20

## 2020-07-08 NOTE — Patient Instructions (Signed)
Description   Take 2 tablets today and then start taking 1.5 tablets daily. Recheck INR in 2 weeks. Call coumadin clinic for any questions [phone].   Please Bill Code [id number].

## 2020-07-08 NOTE — Progress Notes (Signed)
ReDS Vest / Clip - 07/08/20 0700      ReDS Vest / Clip   Station Marker A    Ruler Value 35    ReDS Value Range Low volume    ReDS Actual Value 36

## 2020-07-08 NOTE — Patient Instructions (Signed)
Labs today We will only contact you if something comes back abnormal or we need to make some changes. Otherwise no news is good news!  Your physician recommends that you schedule a follow-up appointment in: 6-8 weeks    Your physician has requested that you have a cardiac MRI. Cardiac MRI uses a computer to create images of your heart as its beating, producing both still and moving pictures of your heart and major blood vessels. For further information please visit [email or web address] Please follow the instruction sheet given to you today for more information.   Your physician has recommended that you have a cardiopulmonary stress test (CPX). CPX testing is a non-invasive measurement of heart and lung function. It replaces a traditional treadmill stress test. This type of test provides a tremendous amount of information that relates not only to your present condition but also for future outcomes. This test combines measurements of you ventilation, respiratory gas exchange in the lungs, electrocardiogram (EKG), blood pressure and physical response before, during, and following an exercise protocol.  If you have any questions or concerns before your next appointment please send [place] a message through [place] or call our office at [phone].    TO LEAVE A MESSAGE FOR THE NURSE SELECT OPTION 2, PLEASE LEAVE A MESSAGE INCLUDING:  YOUR NAME  DATE OF BIRTH  CALL BACK NUMBER  REASON FOR CALL**this is important as we prioritize the call backs  YOU WILL RECEIVE A CALL BACK THE SAME DAY AS LONG AS YOU CALL BEFORE 4:00 PM

## 2020-07-13 ENCOUNTER — Emergency Department (HOSPITAL_COMMUNITY)
Admission: EM | Admit: 2020-07-13 | Discharge: 2020-07-13 | Disposition: A | Discharge status: Home / Self Care | Payer: Medicaid Other | Attending: Emergency Medicine | Admitting: Emergency Medicine

## 2020-07-13 ENCOUNTER — Encounter (HOSPITAL_COMMUNITY): Payer: Self-pay | Admitting: Emergency Medicine

## 2020-07-13 ENCOUNTER — Emergency Department (HOSPITAL_COMMUNITY): Payer: Medicaid Other

## 2020-07-13 DIAGNOSIS — I110 Hypertensive heart disease with heart failure: Secondary | ICD-10-CM | POA: Diagnosis not present

## 2020-07-13 DIAGNOSIS — J45909 Unspecified asthma, uncomplicated: Secondary | ICD-10-CM | POA: Insufficient documentation

## 2020-07-13 DIAGNOSIS — Z20822 Contact with and (suspected) exposure to covid-19: Secondary | ICD-10-CM | POA: Insufficient documentation

## 2020-07-13 DIAGNOSIS — R079 Chest pain, unspecified: Secondary | ICD-10-CM | POA: Diagnosis not present

## 2020-07-13 DIAGNOSIS — Z794 Long term (current) use of insulin: Secondary | ICD-10-CM | POA: Insufficient documentation

## 2020-07-13 DIAGNOSIS — Z79899 Other long term (current) drug therapy: Secondary | ICD-10-CM | POA: Diagnosis not present

## 2020-07-13 DIAGNOSIS — I5043 Acute on chronic combined systolic (congestive) and diastolic (congestive) heart failure: Secondary | ICD-10-CM | POA: Insufficient documentation

## 2020-07-13 DIAGNOSIS — Z7901 Long term (current) use of anticoagulants: Secondary | ICD-10-CM | POA: Insufficient documentation

## 2020-07-13 DIAGNOSIS — R0602 Shortness of breath: Secondary | ICD-10-CM | POA: Insufficient documentation

## 2020-07-13 DIAGNOSIS — Z955 Presence of coronary angioplasty implant and graft: Secondary | ICD-10-CM | POA: Insufficient documentation

## 2020-07-13 DIAGNOSIS — R7989 Other specified abnormal findings of blood chemistry: Secondary | ICD-10-CM | POA: Diagnosis not present

## 2020-07-13 DIAGNOSIS — R0600 Dyspnea, unspecified: Secondary | ICD-10-CM

## 2020-07-13 DIAGNOSIS — E1110 Type 2 diabetes mellitus with ketoacidosis without coma: Secondary | ICD-10-CM | POA: Diagnosis not present

## 2020-07-13 DIAGNOSIS — J9811 Atelectasis: Secondary | ICD-10-CM | POA: Diagnosis not present

## 2020-07-13 LAB — CBC
HCT: 36 % (ref 36.0–46.0)
Hemoglobin: 11.3 g/dL — ABNORMAL LOW (ref 12.0–15.0)
MCH: 24.2 pg — ABNORMAL LOW (ref 26.0–34.0)
MCHC: 31.4 g/dL (ref 30.0–36.0)
MCV: 77.3 fL — ABNORMAL LOW (ref 80.0–100.0)
Platelets: 324 10*3/uL (ref 150–400)
RBC: 4.66 MIL/uL (ref 3.87–5.11)
RDW: 16.5 % — ABNORMAL HIGH (ref 11.5–15.5)
WBC: 10.3 10*3/uL (ref 4.0–10.5)
nRBC: 0 % (ref 0.0–0.2)

## 2020-07-13 LAB — BASIC METABOLIC PANEL
Anion gap: 12 (ref 5–15)
BUN: 6 mg/dL (ref 6–20)
CO2: 19 mmol/L — ABNORMAL LOW (ref 22–32)
Calcium: 8.7 mg/dL — ABNORMAL LOW (ref 8.9–10.3)
Chloride: 100 mmol/L (ref 98–111)
Creatinine, Ser: 0.66 mg/dL (ref 0.44–1.00)
GFR, Estimated: >60 mL/min (ref >60)
Glucose, Bld: 315 mg/dL — ABNORMAL HIGH (ref 70–99)
Potassium: 3.9 mmol/L (ref 3.5–5.1)
Sodium: 131 mmol/L — ABNORMAL LOW (ref 135–145)

## 2020-07-13 LAB — TROPONIN I (HIGH SENSITIVITY)
Troponin I (High Sensitivity): 5 ng/L (ref <18)
Troponin I (High Sensitivity): 6 ng/L (ref <18)

## 2020-07-13 LAB — D-DIMER, QUANTITATIVE: D-Dimer, Quant: 0.54 ug/mL-FEU — ABNORMAL HIGH (ref 0.00–0.50)

## 2020-07-13 LAB — PROTIME-INR
INR: 1.1 (ref 0.8–1.2)
Prothrombin Time: 14.2 seconds (ref 11.4–15.2)

## 2020-07-13 LAB — BRAIN NATRIURETIC PEPTIDE: B Natriuretic Peptide: 53 pg/mL (ref 0.0–100.0)

## 2020-07-13 LAB — RESP PANEL BY RT-PCR (FLU A&B, COVID) ARPGX2
Influenza A by PCR: NEGATIVE
Influenza B by PCR: NEGATIVE
SARS Coronavirus 2 by RT PCR: NEGATIVE

## 2020-07-13 MED ORDER — MORPHINE SULFATE (PF) 4 MG/ML IV SOLN
4.0000 mg | Freq: Once | INTRAVENOUS | Status: AC
  Administered 2020-07-13: 13:00:00 4 mg via INTRAVENOUS
  Filled 2020-07-13: qty 1

## 2020-07-13 MED ORDER — IOHEXOL 350 MG/ML SOLN
100.0000 mL | Freq: Once | INTRAVENOUS | Status: AC | PRN
  Administered 2020-07-13: 15:00:00 100 mL via INTRAVENOUS

## 2020-07-13 NOTE — ED Notes (Signed)
Patient transported to CT 

## 2020-07-13 NOTE — Discharge Instructions (Addendum)
Fortunately the test today did not show any signs of acute issues with your heart or lungs.Continue your current medications.  Follow-up with your cardiologist as we discussed for further evaluation/ treatment.

## 2020-07-13 NOTE — ED Triage Notes (Signed)
Patient c/o SOB since last night. Hx CHF. Tachycardic in triage. Reports taking lasix as prescribed.

## 2020-07-13 NOTE — ED Provider Notes (Addendum)
[facility] DEPT Provider Note   CSN: [id number] Arrival date & time: 07/13/20  1110     History Chief Complaint  Patient presents with  . Shortness of Breath    [name] is a 44 y.o. female.  HPI   Patient presents ED with complaints of chest discomfort, body discomfort and shortness of breath.  Patient states she has had the symptoms ongoing for about a week now.  Patient states she is having the symptoms associated with her heart troubles.  Patient states she has had some headache on the left side of her chest as well as her lower back.  In fact she is having pain all throughout her body.  Patient states she is also feeling more short of breath than usual with activity.  Patient states she was last seen at the cardiology office on December 15 for the same complaint.  Patient states she did not feel like the doctor heard what she was saying.  Patient states she knows her body and there is something wrong.  Patient previously had her torsemide increased and it did not feel like it is helping.  Plan was to have patient get a cardiac MRI and then follow-up with Dr. Aundra Dubin.  Past Medical History:  Diagnosis Date  . Abnormal Pap smear   . Acute on chronic combined systolic and diastolic CHF (congestive heart failure) ([place]) 02/19/2019  . Acute on chronic congestive heart failure ([place])   . Anemia   . Asthma   . Atrial fibrillation ([place])   . Diabetes mellitus   . DKA, type 2 ([place]) 02/06/2019  . Heart failure with reduced ejection fraction ([place])   . Hypertension   . 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    . Migraines   . Morbid obesity ([place]) 02/06/2019  . Obstructive sleep apnea 06/30/2015  . Polycystic ovarian disease     Patient Active Problem List   Diagnosis Date Noted  . Hypokalemia 10/13/2019  . Hyponatremia 10/13/2019  .  Encounter for therapeutic drug monitoring 10/07/2019  . Acute on chronic congestive heart failure ([place])   . Hypertensive urgency 09/30/2019  . Elevated troponin 09/30/2019  . Hypertensive emergency 09/30/2019  . Attempting to conceive 03/10/2019  . Acute on chronic systolic CHF (congestive heart failure) ([place]) 02/19/2019  . Asthma   . Yeast infection 02/06/2019  . Right ventricular failure ([place]) 02/06/2019  . Morbid obesity ([place]) 02/06/2019  . HFrEF (heart failure with reduced ejection fraction) ([place]) 12/31/2018  . Paroxysmal atrial fibrillation ([place]) 12/31/2018  . Anxiety 12/31/2018  . Hyperlipidemia associated with type 2 diabetes mellitus ([place]) 10/30/2017  . Obstructive sleep apnea 06/30/2015  . Anemia due to blood loss, chronic 06/27/2012  . Allergic rhinitis 06/27/2012  . Major depressive disorder 02/08/2012  . Hypertension associated with diabetes ([place]) 12/04/2011  . Dysmenorrhea 11/22/2011  . Vaginal yeast infection 05/19/2011  . DM2 (diabetes mellitus, type 2) ([place]) 05/19/2011  . PCOS (polycystic ovarian syndrome) 05/19/2011  . Hydrosalpinx 08/21/2009    Past Surgical History:  Procedure Laterality Date  . CERVIX LESION DESTRUCTION    . CESAREAN SECTION    . RIGHT/LEFT HEART CATH AND CORONARY ANGIOGRAPHY N/A 03/29/2019   Procedure: RIGHT/LEFT HEART CATH AND CORONARY ANGIOGRAPHY;  Surgeon: Larey Dresser, MD;  Location: [facility];  Service: Cardiovascular;  Laterality: N/A;     OB  History    Gravida  2   Para  2   Term  2   Preterm      AB      Living  2     SAB      IAB      Ectopic      Multiple      Live Births  2           Family History  Problem Relation Age of Onset  . Diabetes Mother   . Hypertension Mother   . Hypertension Father   . Diabetes Father     Social History   Tobacco Use  . Smoking status: Never Smoker  . Smokeless tobacco: Never Used  Vaping Use  . Vaping Use: Never used  Substance Use Topics  .  Alcohol use: No    Alcohol/week: 0.0 standard drinks  . Drug use: No    Home Medications Prior to Admission medications   Medication Sig Start Date End Date Taking? Authorizing Provider  albuterol (VENTOLIN HFA) 108 (90 Base) MCG/ACT inhaler Inhale 2 puffs into the lungs every 6 (six) hours as needed for wheezing or shortness of breath. 06/16/20  Yes Simmons, Brittainy M, PA-C  atorvastatin (LIPITOR) 40 MG tablet TAKE 1 TABLET BY MOUTH DAILY AT 6 PM. Patient taking differently: Take 40 mg by mouth See admin instructions. Takes 1 tablet 3 times a week ( Monday ,Wednesday & Friday) 04/01/20  Yes Larey Dresser, MD  busPIRone (BUSPAR) 7.5 MG tablet Take 7.5 mg by mouth 2 (two) times daily. 06/03/20  Yes [provider]  carvedilol (COREG) 25 MG tablet Take 1 tablet (25 mg total) by mouth 2 (two) times daily with a meal. 01/23/20  Yes Larey Dresser, MD  digoxin (LANOXIN) 0.125 MG tablet Take 1 tablet (0.125 mg total) by mouth daily. 11/15/19  Yes Rosita Fire, Brittainy M, PA-C  empagliflozin (JARDIANCE) 25 MG TABS tablet Take 25 mg by mouth daily. 10/03/19  Yes Pokhrel, Laxman, MD  furosemide (LASIX) 20 MG tablet Take 20 mg by mouth daily. 05/26/20  Yes [provider]  gabapentin (NEURONTIN) 100 MG capsule Take 100 mg by mouth 2 (two) times daily.   Yes [provider]  hydrOXYzine (ATARAX/VISTARIL) 10 MG tablet TAKE 1 TABLET (10 MG TOTAL) BY MOUTH 3 (THREE) TIMES DAILY AS NEEDED. Patient taking differently: Take 10 mg by mouth daily as needed. 07/08/20  Yes Gifford Shave, MD  insulin glargine (LANTUS) 100 UNIT/ML injection Inject 0.22 mLs (22 Units total) into the skin daily. 12/06/19  Yes Bland, Scott, DO  isosorbide-hydrALAZINE (BIDIL) 20-37.5 MG tablet Take 0.5 tablets by mouth 3 (three) times daily. 04/03/20  Yes Larey Dresser, MD  meclizine (ANTIVERT) 12.5 MG tablet Take 1 tablet (12.5 mg total) by mouth 3 (three) times daily as needed for dizziness. 09/19/19  Yes  Bland, Scott, DO  potassium chloride SA (KLOR-CON) 20 MEQ tablet Take 20 mEq by mouth See admin instructions. Take 1 tablet by mouth every Monday Wednesday and Friday   Yes [provider]  sacubitril-valsartan (ENTRESTO) 97-103 MG Take 1 tablet by mouth 2 (two) times daily. 11/28/19  Yes Larey Dresser, MD  sertraline (ZOLOFT) 25 MG tablet Take 1 tablet (25 mg total) by mouth daily. 05/26/20  Yes Larey Dresser, MD  spironolactone (ALDACTONE) 25 MG tablet Take 25 mg by mouth daily.   Yes [provider]  torsemide (DEMADEX) 20 MG tablet Take 4 tablets (80 mg total)  by mouth daily. Patient taking differently: Take 100 mg by mouth daily. 06/16/20  Yes Simmons, Brittainy M, PA-C  traZODone (DESYREL) 50 MG tablet TAKE 1 TABLET (50 MG TOTAL) BY MOUTH AT BEDTIME AS NEEDED FOR SLEEP. 07/08/20  Yes Gifford Shave, MD  warfarin (COUMADIN) 7.5 MG tablet Take as directed by [facility] Patient taking differently: Take 15 mg by mouth daily. 06/24/20  Yes Camnitz, Ocie Doyne, MD  Accu-Chek Softclix Lancets lancets Use as instructed 02/12/19   Caroline More, DO  Accu-Chek Softclix Lancets lancets Check cbg 2 times daily 08/16/19   [name], DO  Blood Glucose Monitoring Suppl (ACCU-CHEK AVIVA PLUS) w/Device KIT 1 application by Does not apply route QID. Check fasting sugars as well as throughout the day 02/12/19   Caroline More, DO  glucose blood (ACCU-CHEK AVIVA PLUS) test strip Check CBG 2 times per day 08/16/19   [name], DO  glucose blood (AGAMATRIX PRESTO TEST) test strip Use as instructed 10/23/17   Zenia Resides, MD  ivabradine (CORLANOR) 5 MG TABS tablet Take 1 tablet (5 mg total) by mouth 2 (two) times daily with a meal. Patient not taking: Reported on 07/13/2020 05/26/20   Larey Dresser, MD  Lancets Misc. (ACCU-CHEK SOFTCLIX LANCET DEV) KIT 1 application by Does not apply route 2 (two) times daily. 08/16/19   [name], DO  gabapentin (NEURONTIN) 100 MG capsule  Take 1 capsule (100 mg total) by mouth every 8 (eight) hours. 11/30/19   [name], DO    Allergies    Shellfish allergy, Dulaglutide, Metformin and related, and Ibuprofen  Review of Systems   Review of Systems  All other systems reviewed and are negative.   Physical Exam Updated Vital Signs BP 129/83   Pulse 98   Temp 98.5 F (36.9 C) (Oral)   Resp 18   LMP 06/19/2020   SpO2 100%   Physical Exam Vitals and nursing note reviewed.  Constitutional:      General: She is not in acute distress.    Appearance: She is well-developed, overweight and well-nourished.  HENT:     Head: Normocephalic and atraumatic.     Right Ear: External ear normal.     Left Ear: External ear normal.  Eyes:     General: No scleral icterus.       Right eye: No discharge.        Left eye: No discharge.     Conjunctiva/sclera: Conjunctivae normal.  Neck:     Trachea: No tracheal deviation.  Cardiovascular:     Rate and Rhythm: Normal rate and regular rhythm.     Pulses: Intact distal pulses.  Pulmonary:     Effort: Pulmonary effort is normal. No respiratory distress.     Breath sounds: Normal breath sounds. No stridor. No wheezing or rales.  Abdominal:     General: Bowel sounds are normal. There is no distension.     Palpations: Abdomen is soft.     Tenderness: There is no abdominal tenderness. There is no guarding or rebound.  Musculoskeletal:        General: No tenderness or edema.     Cervical back: Neck supple.  Skin:    General: Skin is warm and dry.     Findings: No rash.  Neurological:     Mental Status: She is alert.     Cranial Nerves: No cranial nerve deficit (no facial droop, extraocular movements intact, no slurred speech).     Sensory: No sensory  deficit.     Motor: No abnormal muscle tone or seizure activity.     Coordination: Coordination normal.     Deep Tendon Reflexes: Strength normal.  Psychiatric:        Mood and Affect: Mood and affect normal.     ED Results /  Procedures / Treatments   Labs (all labs ordered are listed, but only abnormal results are displayed) Labs Reviewed  BASIC METABOLIC PANEL - Abnormal; Notable for the following components:      Result Value   Sodium 131 (*)    CO2 19 (*)    Glucose, Bld 315 (*)    Calcium 8.7 (*)    All other components within normal limits  CBC - Abnormal; Notable for the following components:   Hemoglobin 11.3 (*)    MCV 77.3 (*)    MCH 24.2 (*)    RDW 16.5 (*)    All other components within normal limits  D-DIMER, QUANTITATIVE (NOT AT [facility]) - Abnormal; Notable for the following components:   D-Dimer, Quant 0.54 (*)    All other components within normal limits  RESP PANEL BY RT-PCR (FLU A&B, COVID) ARPGX2  BRAIN NATRIURETIC PEPTIDE  PROTIME-INR  URINALYSIS, ROUTINE W REFLEX MICROSCOPIC  TROPONIN I (HIGH SENSITIVITY)  TROPONIN I (HIGH SENSITIVITY)    EKG EKG Interpretation  Date/Time:  Monday July 13 2020 11:23:46 EST Ventricular Rate:  113 PR Interval:  140 QRS Duration: 74 QT Interval:  318 QTC Calculation: 436 R Axis:   -29 Text Interpretation: Sinus tachycardia Nonspecific T wave abnormality Abnormal ECG No significant change since last tracing Confirmed by Dorie Rank [id number]) on 07/13/2020 11:33:24 AM   Radiology DG Chest 2 View  Result Date: 07/13/2020 CLINICAL DATA:  Shortness of breath EXAM: CHEST - 2 VIEW COMPARISON:  05/21/2020 FINDINGS: The heart size and mediastinal contours are within normal limits. Both lungs are clear. The visualized skeletal structures are unremarkable. IMPRESSION: No active cardiopulmonary disease. Electronically Signed   By: Inez Catalina M.D.   On: 07/13/2020 12:10   CT Angio Chest PE W and/or Wo Contrast  Result Date: 07/13/2020 CLINICAL DATA:  Evaluate for pulmonary embolus. Low to intermediate probability. Positive D-dimer. EXAM: CT ANGIOGRAPHY CHEST WITH CONTRAST TECHNIQUE: Multidetector CT imaging of the chest was performed using the  standard protocol during bolus administration of intravenous contrast. Multiplanar CT image reconstructions and MIPs were obtained to evaluate the vascular anatomy. CONTRAST:  148m OMNIPAQUE IOHEXOL 350 MG/ML SOLN COMPARISON:  09/30/2019 FINDINGS: Cardiovascular: Satisfactory opacification of the pulmonary arteries to the segmental level. No evidence of pulmonary embolism. Normal heart size. No pericardial effusion. Normal heart size.  No pericardial effusion identified. Mediastinum/Nodes: No enlarged mediastinal, hilar, or axillary lymph nodes. Thyroid gland, trachea, and esophagus demonstrate no significant findings. Lungs/Pleura: No pleural effusion. No airspace consolidation, atelectasis, or pneumothorax. Upper Abdomen: No acute abnormality. Musculoskeletal: No chest wall abnormality. No acute or significant osseous findings. Review of the MIP images confirms the above findings. IMPRESSION: 1. No acute cardiopulmonary abnormality identified. 2. No evidence for acute pulmonary embolus. Electronically Signed   By: TKerby MoorsM.D.   On: 07/13/2020 14:53    Procedures Procedures (including critical care time)  Medications Ordered in ED Medications  morphine 4 MG/ML injection 4 mg (4 mg Intravenous Given 07/13/20 1258)  iohexol (OMNIPAQUE) 350 MG/ML injection 100 mL (100 mLs Intravenous Contrast Given 07/13/20 1437)    ED Course  I have reviewed the triage vital signs and the nursing notes.  Pertinent labs & imaging results that were available during my care of the patient were reviewed by me and considered in my medical decision making (see chart for details).  Clinical Course as of 07/13/20 1541  Mon Jul 13, 2020  1353 D-dimer slightly elevated at 0.54 slightly above the normal level of 50 [JK]  1357 BNP is normal at 53 [JK]  1358 CBC stable [JK]  1358 Hyperglycemia noted [JK]  1358 CXR without acute findings [JK]  1359 Trop normal at 5 [JK]    Clinical Course User Index [JK] Dorie Rank, MD   MDM Rules/Calculators/A&P                          Patient presented to ED for evaluation of shortness of breath.  In the ED she does not appear to be hypoxic.  Vital signs are otherwise reassuring.  Chest x-ray does not show pneumonia or pulmonary edema.  Patient serial troponins are negative.  I doubt ACS.  Patient did have a slightly elevated D-dimer.  Discussed these findings with patient.  We will proceed with CT angiogram.  Suspect this may come back normal.  If negative patient appears stable to follow-up with her primary care doctor and cardiologist.  CT angio negative.  Stable for discharge Final Clinical Impression(s) / ED Diagnoses Final diagnoses:  Dyspnea, unspecified type    Rx / DC Orders ED Discharge Orders    None       Dorie Rank, MD 07/13/20 1541    Dorie Rank, MD 07/13/20 1556

## 2020-07-14 ENCOUNTER — Telehealth: Payer: Self-pay

## 2020-07-14 NOTE — Telephone Encounter (Signed)
Transition Care Management Follow-up Telephone Call  Date of discharge and from where: 07/13/2020 from [facility]  How have you been since you were released from the hospital? Patient states that she feels the same.   Any questions or concerns? No  Items Reviewed:  Did the pt receive and understand the discharge instructions provided? Yes   Medications obtained and verified? Yes   Other? No   Any new allergies since your discharge? No   Dietary orders reviewed? Yes  Do you have support at home? Yes   Functional Questionnaire: (I = Independent and D = Dependent) ADLs: I  Bathing/Dressing- I  Meal Prep- I  Eating- I  Maintaining continence- I  Transferring/Ambulation- I  Managing Meds- I   Follow up appointments reviewed:   PCP Hospital f/u appt confirmed? No    Are transportation arrangements needed? No   If their condition worsens, is the pt aware to call PCP or go to the Emergency Dept.? Yes  Was the patient provided with contact information for the PCP's office or ED? Yes  Was to pt encouraged to call back with questions or concerns? Yes

## 2020-07-23 ENCOUNTER — Telehealth (HOSPITAL_COMMUNITY): Payer: Self-pay

## 2020-07-23 NOTE — Telephone Encounter (Signed)
I called Ms Aker to schedule an appointment. She did not answer so I left a message requesting that she call me back.   Jacqualine Code, EMT 07/23/20

## 2020-07-27 ENCOUNTER — Other Ambulatory Visit: Payer: Self-pay

## 2020-07-27 ENCOUNTER — Encounter (HOSPITAL_COMMUNITY): Payer: Self-pay | Admitting: Emergency Medicine

## 2020-07-27 DIAGNOSIS — Z7901 Long term (current) use of anticoagulants: Secondary | ICD-10-CM | POA: Insufficient documentation

## 2020-07-27 DIAGNOSIS — E785 Hyperlipidemia, unspecified: Secondary | ICD-10-CM | POA: Insufficient documentation

## 2020-07-27 DIAGNOSIS — J45909 Unspecified asthma, uncomplicated: Secondary | ICD-10-CM | POA: Diagnosis not present

## 2020-07-27 DIAGNOSIS — E1169 Type 2 diabetes mellitus with other specified complication: Secondary | ICD-10-CM | POA: Insufficient documentation

## 2020-07-27 DIAGNOSIS — I5043 Acute on chronic combined systolic (congestive) and diastolic (congestive) heart failure: Secondary | ICD-10-CM | POA: Diagnosis not present

## 2020-07-27 DIAGNOSIS — E1159 Type 2 diabetes mellitus with other circulatory complications: Secondary | ICD-10-CM | POA: Diagnosis not present

## 2020-07-27 DIAGNOSIS — R5383 Other fatigue: Secondary | ICD-10-CM | POA: Diagnosis not present

## 2020-07-27 DIAGNOSIS — I110 Hypertensive heart disease with heart failure: Secondary | ICD-10-CM | POA: Insufficient documentation

## 2020-07-27 DIAGNOSIS — I10 Essential (primary) hypertension: Secondary | ICD-10-CM | POA: Diagnosis not present

## 2020-07-27 DIAGNOSIS — R531 Weakness: Secondary | ICD-10-CM | POA: Diagnosis not present

## 2020-07-27 DIAGNOSIS — I4891 Unspecified atrial fibrillation: Secondary | ICD-10-CM | POA: Insufficient documentation

## 2020-07-27 DIAGNOSIS — Z7984 Long term (current) use of oral hypoglycemic drugs: Secondary | ICD-10-CM | POA: Diagnosis not present

## 2020-07-27 DIAGNOSIS — Z20822 Contact with and (suspected) exposure to covid-19: Secondary | ICD-10-CM | POA: Diagnosis not present

## 2020-07-27 DIAGNOSIS — R791 Abnormal coagulation profile: Secondary | ICD-10-CM | POA: Diagnosis not present

## 2020-07-27 DIAGNOSIS — R42 Dizziness and giddiness: Secondary | ICD-10-CM | POA: Diagnosis not present

## 2020-07-27 DIAGNOSIS — Z794 Long term (current) use of insulin: Secondary | ICD-10-CM | POA: Insufficient documentation

## 2020-07-27 DIAGNOSIS — E1165 Type 2 diabetes mellitus with hyperglycemia: Secondary | ICD-10-CM | POA: Insufficient documentation

## 2020-07-27 DIAGNOSIS — Z79899 Other long term (current) drug therapy: Secondary | ICD-10-CM | POA: Insufficient documentation

## 2020-07-27 DIAGNOSIS — R079 Chest pain, unspecified: Secondary | ICD-10-CM | POA: Insufficient documentation

## 2020-07-27 DIAGNOSIS — Z7951 Long term (current) use of inhaled steroids: Secondary | ICD-10-CM | POA: Insufficient documentation

## 2020-07-27 DIAGNOSIS — J9811 Atelectasis: Secondary | ICD-10-CM | POA: Diagnosis not present

## 2020-07-27 DIAGNOSIS — R0789 Other chest pain: Secondary | ICD-10-CM | POA: Diagnosis not present

## 2020-07-27 NOTE — ED Triage Notes (Signed)
Pt arriving with complaint of dizziness, fatigue, and chest pain that began yesterday. Pt states condition seemed to worsen throughout the day. Pt has hx of HTN and diabetes. Due to nausea, pt reports she has not taken her BP medication for 2 days.

## 2020-07-28 ENCOUNTER — Emergency Department (HOSPITAL_COMMUNITY): Payer: Medicaid Other

## 2020-07-28 ENCOUNTER — Emergency Department (HOSPITAL_COMMUNITY)
Admission: EM | Admit: 2020-07-28 | Discharge: 2020-07-28 | Disposition: A | Discharge status: Home / Self Care | Payer: Medicaid Other | Attending: Emergency Medicine | Admitting: Emergency Medicine

## 2020-07-28 ENCOUNTER — Encounter (HOSPITAL_COMMUNITY): Payer: Medicaid Other

## 2020-07-28 ENCOUNTER — Encounter (HOSPITAL_COMMUNITY): Payer: Self-pay

## 2020-07-28 DIAGNOSIS — R531 Weakness: Secondary | ICD-10-CM | POA: Diagnosis not present

## 2020-07-28 DIAGNOSIS — R5383 Other fatigue: Secondary | ICD-10-CM | POA: Diagnosis not present

## 2020-07-28 DIAGNOSIS — R739 Hyperglycemia, unspecified: Secondary | ICD-10-CM

## 2020-07-28 DIAGNOSIS — R079 Chest pain, unspecified: Secondary | ICD-10-CM

## 2020-07-28 DIAGNOSIS — R42 Dizziness and giddiness: Secondary | ICD-10-CM | POA: Diagnosis not present

## 2020-07-28 DIAGNOSIS — J9811 Atelectasis: Secondary | ICD-10-CM | POA: Diagnosis not present

## 2020-07-28 DIAGNOSIS — R791 Abnormal coagulation profile: Secondary | ICD-10-CM

## 2020-07-28 LAB — CBC
HCT: 37.9 % (ref 36.0–46.0)
Hemoglobin: 11.7 g/dL — ABNORMAL LOW (ref 12.0–15.0)
MCH: 24.4 pg — ABNORMAL LOW (ref 26.0–34.0)
MCHC: 30.9 g/dL (ref 30.0–36.0)
MCV: 79.1 fL — ABNORMAL LOW (ref 80.0–100.0)
Platelets: 350 10*3/uL (ref 150–400)
RBC: 4.79 MIL/uL (ref 3.87–5.11)
RDW: 16.8 % — ABNORMAL HIGH (ref 11.5–15.5)
WBC: 10.9 10*3/uL — ABNORMAL HIGH (ref 4.0–10.5)
nRBC: 0 % (ref 0.0–0.2)

## 2020-07-28 LAB — BASIC METABOLIC PANEL
Anion gap: 10 (ref 5–15)
BUN: 6 mg/dL (ref 6–20)
CO2: 21 mmol/L — ABNORMAL LOW (ref 22–32)
Calcium: 9 mg/dL (ref 8.9–10.3)
Chloride: 99 mmol/L (ref 98–111)
Creatinine, Ser: 0.73 mg/dL (ref 0.44–1.00)
GFR, Estimated: >60 mL/min (ref >60)
Glucose, Bld: 478 mg/dL — ABNORMAL HIGH (ref 70–99)
Potassium: 3.9 mmol/L (ref 3.5–5.1)
Sodium: 130 mmol/L — ABNORMAL LOW (ref 135–145)

## 2020-07-28 LAB — BRAIN NATRIURETIC PEPTIDE: B Natriuretic Peptide: 439.7 pg/mL — ABNORMAL HIGH (ref 0.0–100.0)

## 2020-07-28 LAB — CBG MONITORING, ED: Glucose-Capillary: 321 mg/dL — ABNORMAL HIGH (ref 70–99)

## 2020-07-28 LAB — POC SARS CORONAVIRUS 2 AG -  ED: SARS Coronavirus 2 Ag: NEGATIVE

## 2020-07-28 LAB — PROTIME-INR
INR: 1 (ref 0.8–1.2)
Prothrombin Time: 12.3 seconds (ref 11.4–15.2)

## 2020-07-28 LAB — TROPONIN I (HIGH SENSITIVITY)
Troponin I (High Sensitivity): 5 ng/L (ref <18)
Troponin I (High Sensitivity): 4 ng/L (ref <18)

## 2020-07-28 LAB — I-STAT BETA HCG BLOOD, ED (MC, WL, AP ONLY): I-stat hCG, quantitative: <5 m[IU]/mL (ref <5)

## 2020-07-28 MED ORDER — SODIUM CHLORIDE 0.9 % IV BOLUS
250.0000 mL | Freq: Once | INTRAVENOUS | Status: AC
  Administered 2020-07-28: 250 mL via INTRAVENOUS

## 2020-07-28 MED ORDER — INSULIN ASPART 100 UNIT/ML ~~LOC~~ SOLN
3.0000 [IU] | Freq: Once | SUBCUTANEOUS | Status: AC
  Administered 2020-07-28: 3 [IU] via INTRAVENOUS
  Filled 2020-07-28: qty 0.03

## 2020-07-28 MED ORDER — MORPHINE SULFATE (PF) 4 MG/ML IV SOLN
4.0000 mg | Freq: Once | INTRAVENOUS | Status: AC
Start: 2020-07-28 — End: 2020-07-28
  Administered 2020-07-28: 4 mg via INTRAVENOUS
  Filled 2020-07-28: qty 1

## 2020-07-28 MED ORDER — FUROSEMIDE 40 MG PO TABS
20.0000 mg | ORAL_TABLET | Freq: Once | ORAL | Status: AC
  Administered 2020-07-28: 20 mg via ORAL
  Filled 2020-07-28: qty 1

## 2020-07-28 MED ORDER — IOHEXOL 350 MG/ML SOLN
100.0000 mL | Freq: Once | INTRAVENOUS | Status: AC | PRN
  Administered 2020-07-28: 100 mL via INTRAVENOUS

## 2020-07-28 NOTE — Discharge Instructions (Signed)
As we discussed it's very important that you take your medications. Make sure you are taking your Coumadin as directed. You were slightly low today.   Follow up with your cardiologist as previously scheduled.   Return to the Emergency Department immediately if you experiencing worsening chest pain, difficulty breathing, nausea/vomiting, get very sweaty, headache or any other worsening or concerning symptoms.

## 2020-07-28 NOTE — ED Provider Notes (Signed)
[facility] DEPT Provider Note   CSN: [id number] Arrival date & time: 07/27/20  2340     History Chief Complaint  Patient presents with  . Dizziness  . Fatigue    [name] is a 45 y.o. female with PMH/o CHF (right side HF with EF of 35% per last ECHO in November), Afib (on coumadin), DM, HTN brought in by EMS for evaluation of chest pain, fatigue and lightheadedness that began yesterday evening (07/27/19). She reports that last night, she was at home watching a movie when she started experiencing chest pain. She states that she started having midsternal chest pain that radiated up and into the left. She describes the pain as a "sharp and burning pain." She had some associated nausea, but no diaphoresis or vomiting. She states that later on she felt like she had some SOB. She initially felt that the pain was just an ache so she was going to wait it out at home. Last night, as she was walking down the steps, she felt like she felt lightheaded and like she was going to pass out. She did not have any room spinning sensation. She states that the chest pain eased up throughout the duration of the night into today but never went away completely. She stated that it started spreading towards her left arm and she felt like it was getting worse, prompting EMS call. She states that there is nothing that makes the pain better. She notes it is worse with deep inspiration as well as movement and exertion. She feels like whether she lays down or sits up, she still has pain. She had one episode of vomiting today. No abdominal pain. She states that she has been compliant with her coumadin but has sometimes missed her HTN and other meds. She has not been vaccinated for COVID and denies any COVID exposure. She reports at history of an LV thrombus and HF but no other personal cardiac history. She does not think she has any family history of MI before the age of 73 but is unsure. She  denies any OCP use, recent immobilization, prior history of DVT/PE, recent surgery, leg swelling, or long travel. She denies any fevers, cough, abdominal pain, leg swelling.   The history is provided by the patient.    HPI: A 45 year old patient with a history of treated diabetes and hypertension presents for evaluation of chest pain. Initial onset of pain was more than 6 hours ago. The patient's chest pain is described as heaviness/pressure/tightness, is sharp and is worse with exertion. The patient complains of nausea. The patient's chest pain is not middle- or left-sided, is not well-localized and does radiate to the arms/jaw/neck. The patient denies diaphoresis. The patient has no history of stroke, has no history of peripheral artery disease, has not smoked in the past 90 days, has no relevant family history of coronary artery disease (first degree relative at less than age 45), has no history of hypercholesterolemia and does not have an elevated BMI (>=30).   Past Medical History:  Diagnosis Date  . Abnormal Pap smear   . Acute on chronic combined systolic and diastolic CHF (congestive heart failure) ([place]) 02/19/2019  . Acute on chronic congestive heart failure ([place])   . Anemia   . Asthma   . Atrial fibrillation ([place])   . Diabetes mellitus   . DKA, type 2 ([place]) 02/06/2019  . Heart failure with reduced ejection fraction ([place])   . Hypertension   .  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    . Migraines   . Morbid obesity ([place]) 02/06/2019  . Obstructive sleep apnea 06/30/2015  . Polycystic ovarian disease     Patient Active Problem List   Diagnosis Date Noted  . Hypokalemia 10/13/2019  . Hyponatremia 10/13/2019  . Encounter for therapeutic drug monitoring 10/07/2019  . Acute on chronic congestive heart failure ([place])   . Hypertensive urgency 09/30/2019  . Elevated troponin  09/30/2019  . Hypertensive emergency 09/30/2019  . Attempting to conceive 03/10/2019  . Acute on chronic systolic CHF (congestive heart failure) ([place]) 02/19/2019  . Asthma   . Yeast infection 02/06/2019  . Right ventricular failure ([place]) 02/06/2019  . Morbid obesity ([place]) 02/06/2019  . HFrEF (heart failure with reduced ejection fraction) ([place]) 12/31/2018  . Paroxysmal atrial fibrillation ([place]) 12/31/2018  . Anxiety 12/31/2018  . Hyperlipidemia associated with type 2 diabetes mellitus ([place]) 10/30/2017  . Obstructive sleep apnea 06/30/2015  . Anemia due to blood loss, chronic 06/27/2012  . Allergic rhinitis 06/27/2012  . Major depressive disorder 02/08/2012  . Hypertension associated with diabetes ([place]) 12/04/2011  . Dysmenorrhea 11/22/2011  . Vaginal yeast infection 05/19/2011  . DM2 (diabetes mellitus, type 2) ([place]) 05/19/2011  . PCOS (polycystic ovarian syndrome) 05/19/2011  . Hydrosalpinx 08/21/2009    Past Surgical History:  Procedure Laterality Date  . CERVIX LESION DESTRUCTION    . CESAREAN SECTION    . RIGHT/LEFT HEART CATH AND CORONARY ANGIOGRAPHY N/A 03/29/2019   Procedure: RIGHT/LEFT HEART CATH AND CORONARY ANGIOGRAPHY;  Surgeon: Larey Dresser, MD;  Location: [facility];  Service: Cardiovascular;  Laterality: N/A;     OB History    Gravida  2   Para  2   Term  2   Preterm      AB      Living  2     SAB      IAB      Ectopic      Multiple      Live Births  2           Family History  Problem Relation Age of Onset  . Diabetes Mother   . Hypertension Mother   . Hypertension Father   . Diabetes Father     Social History   Tobacco Use  . Smoking status: Never Smoker  . Smokeless tobacco: Never Used  Vaping Use  . Vaping Use: Never used  Substance Use Topics  . Alcohol use: No    Alcohol/week: 0.0 standard drinks  . Drug use: No    Home Medications Prior to Admission medications   Medication Sig Start Date End Date  Taking? Authorizing Provider  Accu-Chek Softclix Lancets lancets Use as instructed 02/12/19   Caroline More, DO  Accu-Chek Softclix Lancets lancets Check cbg 2 times daily 08/16/19   [name], DO  albuterol (VENTOLIN HFA) 108 (90 Base) MCG/ACT inhaler Inhale 2 puffs into the lungs every 6 (six) hours as needed for wheezing or shortness of breath. 06/16/20   Lyda Jester M, PA-C  atorvastatin (LIPITOR) 40 MG tablet TAKE 1 TABLET BY MOUTH DAILY AT 6 PM. Patient taking differently: Take 40 mg by mouth See admin instructions. Takes 1 tablet 3 times a week ( Monday ,Wednesday & Friday) 04/01/20   Larey Dresser, MD  Blood Glucose Monitoring Suppl (ACCU-CHEK AVIVA PLUS) w/Device  KIT 1 application by Does not apply route QID. Check fasting sugars as well as throughout the day 02/12/19   Caroline More, DO  busPIRone (BUSPAR) 7.5 MG tablet Take 7.5 mg by mouth 2 (two) times daily. 06/03/20   [provider]  carvedilol (COREG) 25 MG tablet Take 1 tablet (25 mg total) by mouth 2 (two) times daily with a meal. 01/23/20   Larey Dresser, MD  digoxin (LANOXIN) 0.125 MG tablet Take 1 tablet (0.125 mg total) by mouth daily. 11/15/19   Lyda Jester M, PA-C  empagliflozin (JARDIANCE) 25 MG TABS tablet Take 25 mg by mouth daily. 10/03/19   Pokhrel, Corrie Mckusick, MD  furosemide (LASIX) 20 MG tablet Take 20 mg by mouth daily. 05/26/20   [provider]  gabapentin (NEURONTIN) 100 MG capsule Take 100 mg by mouth 2 (two) times daily.    [provider]  glucose blood (ACCU-CHEK AVIVA PLUS) test strip Check CBG 2 times per day 08/16/19   [name], DO  glucose blood (AGAMATRIX PRESTO TEST) test strip Use as instructed 10/23/17   Zenia Resides, MD  hydrOXYzine (ATARAX/VISTARIL) 10 MG tablet TAKE 1 TABLET (10 MG TOTAL) BY MOUTH 3 (THREE) TIMES DAILY AS NEEDED. Patient taking differently: Take 10 mg by mouth daily as needed. 07/08/20   Gifford Shave, MD  insulin glargine (LANTUS) 100  UNIT/ML injection Inject 0.22 mLs (22 Units total) into the skin daily. 12/06/19   Bland, Scott, DO  isosorbide-hydrALAZINE (BIDIL) 20-37.5 MG tablet Take 0.5 tablets by mouth 3 (three) times daily. 04/03/20   Larey Dresser, MD  ivabradine (CORLANOR) 5 MG TABS tablet Take 1 tablet (5 mg total) by mouth 2 (two) times daily with a meal. Patient not taking: No sig reported 05/26/20   Larey Dresser, MD  Lancets Misc. (ACCU-CHEK SOFTCLIX LANCET DEV) KIT 1 application by Does not apply route 2 (two) times daily. 08/16/19   [name], DO  meclizine (ANTIVERT) 12.5 MG tablet Take 1 tablet (12.5 mg total) by mouth 3 (three) times daily as needed for dizziness. 09/19/19   [name], DO  potassium chloride SA (KLOR-CON) 20 MEQ tablet Take 20 mEq by mouth See admin instructions. Take 1 tablet by mouth every Monday Wednesday and Friday    [provider]  sacubitril-valsartan (ENTRESTO) 97-103 MG Take 1 tablet by mouth 2 (two) times daily. 11/28/19   Larey Dresser, MD  sertraline (ZOLOFT) 25 MG tablet Take 1 tablet (25 mg total) by mouth daily. 05/26/20   Larey Dresser, MD  spironolactone (ALDACTONE) 25 MG tablet Take 25 mg by mouth daily.    [provider]  torsemide (DEMADEX) 20 MG tablet Take 4 tablets (80 mg total) by mouth daily. Patient taking differently: Take 100 mg by mouth daily. 06/16/20   Lyda Jester M, PA-C  traZODone (DESYREL) 50 MG tablet TAKE 1 TABLET (50 MG TOTAL) BY MOUTH AT BEDTIME AS NEEDED FOR SLEEP. 07/08/20   Gifford Shave, MD  warfarin (COUMADIN) 7.5 MG tablet Take as directed by [facility] Patient taking differently: Take 15 mg by mouth daily. 06/24/20   Camnitz, Ocie Doyne, MD    Allergies    Shellfish allergy, Dulaglutide, Metformin and related, and Ibuprofen  Review of Systems   Review of Systems  Constitutional: Negative for fever.  Respiratory: Positive for shortness of breath. Negative for cough.   Cardiovascular: Positive for  chest pain. Negative for leg swelling.  Gastrointestinal: Negative for abdominal pain, nausea and vomiting.  Genitourinary: Negative for dysuria and hematuria.  Neurological: Positive for light-headedness. Negative for headaches.  All other systems reviewed and are negative.   Physical Exam Updated Vital Signs BP (!) 174/105   Pulse 77   Temp 98.7 F (37.1 C) (Oral)   Resp (!) 28   SpO2 100%   Physical Exam Vitals and nursing note reviewed.  Constitutional:      Appearance: Normal appearance. She is well-developed and well-nourished.  HENT:     Head: Normocephalic and atraumatic.     Mouth/Throat:     Mouth: Oropharynx is clear and moist and mucous membranes are normal.  Eyes:     General: Lids are normal.     Extraocular Movements: EOM normal.     Conjunctiva/sclera: Conjunctivae normal.     Pupils: Pupils are equal, round, and reactive to light.  Cardiovascular:     Rate and Rhythm: Normal rate and regular rhythm.     Pulses: Normal pulses.     Heart sounds: Normal heart sounds. No murmur heard. No friction rub. No gallop.   Pulmonary:     Effort: Pulmonary effort is normal.     Breath sounds: Normal breath sounds.     Comments: Lungs clear to auscultation bilaterally.  Symmetric chest rise.  No wheezing, rales, rhonchi. Able to speak in full sentences without any difficulty.  Chest:     Comments: Pain slightly reproduced with palpation of the mid sternal chest area  Abdominal:     Palpations: Abdomen is soft. Abdomen is not rigid.     Tenderness: There is no abdominal tenderness. There is no guarding.     Comments: Abdomen is soft, non-distended, non-tender. No rigidity, No guarding. No peritoneal signs.  Musculoskeletal:        General: Normal range of motion.     Cervical back: Full passive range of motion without pain.     Comments: BLE are symmetric in appearance without any overlying warmth, erythema, edema.   Skin:    General: Skin is warm and dry.      Capillary Refill: Capillary refill takes less than 2 seconds.  Neurological:     Mental Status: She is alert and oriented to person, place, and time.     Comments: Cranial nerves III-XII intact Follows commands, Moves all extremities  5/5 strength to BUE and BLE  Sensation intact throughout all major nerve distributions No slurred speech. No facial droop.   Psychiatric:        Mood and Affect: Mood and affect normal.        Speech: Speech normal.     ED Results / Procedures / Treatments   Labs (all labs ordered are listed, but only abnormal results are displayed) Labs Reviewed  BASIC METABOLIC PANEL - Abnormal; Notable for the following components:      Result Value   Sodium 130 (*)    CO2 21 (*)    Glucose, Bld 478 (*)    All other components within normal limits  CBC - Abnormal; Notable for the following components:   WBC 10.9 (*)    Hemoglobin 11.7 (*)    MCV 79.1 (*)    MCH 24.4 (*)    RDW 16.8 (*)    All other components within normal limits  BRAIN NATRIURETIC PEPTIDE - Abnormal; Notable for the following components:   B Natriuretic Peptide 439.7 (*)    All other components within normal limits  CBG MONITORING, ED - Abnormal; Notable for the following components:  Glucose-Capillary 321 (*)    All other components within normal limits  PROTIME-INR  I-STAT BETA HCG BLOOD, ED ([facility], [facility], [facility] ONLY)  POC SARS CORONAVIRUS 2 AG -  ED  TROPONIN I (HIGH SENSITIVITY)  TROPONIN I (HIGH SENSITIVITY)    EKG None     EKG: normal EKG, normal sinus rhythm, Rate 94.   Radiology DG Chest 2 View  Result Date: 07/28/2020 CLINICAL DATA:  In evaluation for acute chest pain, dizziness, weakness. EXAM: CHEST - 2 VIEW COMPARISON:  Prior radiograph from 07/13/2020. FINDINGS: Transverse heart size at the upper limits of normal. Mediastinal silhouette within normal limits. Lungs normally inflated. No focal infiltrates, pulmonary edema, or pleural effusion. No pneumothorax. Minimal linear  subsegmental atelectasis present at the right lung base. No acute osseous finding. IMPRESSION: 1. Minimal linear subsegmental atelectasis at the right lung base. 2. No other active cardiopulmonary disease. Electronically Signed   By: Jeannine Boga M.D.   On: 07/28/2020 01:51   CT Angio Chest PE W and/or Wo Contrast  Result Date: 07/28/2020 CLINICAL DATA:  Initial evaluation for increasing chest pain, dizziness, fatigue for 2 days. EXAM: CT ANGIOGRAPHY CHEST WITH CONTRAST TECHNIQUE: Multidetector CT imaging of the chest was performed using the standard protocol during bolus administration of intravenous contrast. Multiplanar CT image reconstructions and MIPs were obtained to evaluate the vascular anatomy. CONTRAST:  132m OMNIPAQUE IOHEXOL 350 MG/ML SOLN COMPARISON:  Prior radiograph from earlier the same day as well as previous CT from 07/13/2020. FINDINGS: Cardiovascular: Intrathoracic aorta of normal caliber without aneurysm. No evidence for dissection or other acute aortic pathology allowing for timing the contrast bolus. Partially visualized great vessels grossly within normal limits. Heart size at the upper limits of normal. No pericardial effusion. Pulmonary arterial tree adequately opacified for evaluation. No filling defect to suggest acute pulmonary embolism. Re-formatted imaging confirms these findings. Mediastinum/Nodes: Visualized thyroid within normal limits. No pathologically enlarged mediastinal, hilar, or axillary lymph nodes. Esophagus within normal limits. Lungs/Pleura: Tracheobronchial tree intact and patent. Lungs well inflated bilaterally. Minimal linear subsegmental atelectasis present at the left lower lobe. No focal infiltrates or consolidative airspace disease. No pulmonary edema or pleural effusion. No pneumothorax. No worrisome pulmonary nodule or mass. Upper Abdomen: Visualized upper abdomen demonstrates no acute or significant finding. Musculoskeletal: External soft tissues  demonstrate no acute finding. No acute osseous abnormality. No discrete or worrisome osseous lesions. Review of the MIP images confirms the above findings. IMPRESSION: 1. No CT evidence for acute pulmonary embolism. 2. No other acute cardiopulmonary abnormality identified. Electronically Signed   By: BJeannine BogaM.D.   On: 07/28/2020 03:27    Procedures Procedures (including critical care time)  Medications Ordered in ED Medications  morphine 4 MG/ML injection 4 mg (4 mg Intravenous Given 07/28/20 0221)  insulin aspart (novoLOG) injection 3 Units (3 Units Intravenous Given 07/28/20 0239)  sodium chloride 0.9 % bolus 250 mL (0 mLs Intravenous Stopped 07/28/20 0320)  iohexol (OMNIPAQUE) 350 MG/ML injection 100 mL (100 mLs Intravenous Contrast Given 07/28/20 0242)  furosemide (LASIX) tablet 20 mg (20 mg Oral Given 07/28/20 0543)    ED Course  I have reviewed the triage vital signs and the nursing notes.  Pertinent labs & imaging results that were available during my care of the patient were reviewed by me and considered in my medical decision making (see chart for details).    MDM Rules/Calculators/A&P HEAR Score: 3  44 y.o. F with PMH/o HTN, HF (EF 35%), LV thrombus, DM, HTN who presents for evaluation of CP, lightheadedness since yesterday. Reports she has been compliant with coumadin but has missed some other doses of her medications. On initial ED arrival, she is uncomfortable but NAD. She is slightly hypertensive but vitals otherwise stable. Consider ACS etiology, myocarditis, pericarditis given patient's history but lower suspicion as I'm able to reproduce the chest pain with palpation and it does have some atypical features. Also consider infectious etiology vs GERD vs CHF exacerbation. History/physical exam is not concerning for aortic dissection. Low suspecion for PE but she does report some pleuritic chest pain. History/physical exam is not concerning for CVA. She  is ambulatory in the ED with no signs of gait ataxia. History/physical exam is not concerning for hypertensive emergency. No neuro deficits noted on exam. Plan to check labs, imaging, EKG.   Review of her records show that she saw her cardiologist on 07/08/20. 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   I-stat beta is negative. INR is subtherapeutic. CBC shows slight leukocytosis of 10.9. Hgb stable at 11.7.  Trop is negative. BNP is 439.7. This is higher than her previous but she has had similar elevations in the past. Will give dose of Lasix.  BMP shows bicarb of 21. BUN and Cr within normal limits. Glucose is 478. Work up not consistent with DKA. CXR shows minimal linear subsegmental atelectasis.  Given that she is subtherapeutic on her coumadin, will obtain CTA of chest for evaluation of her symptoms given pleuritic component to pain, as well as a SOB.   CTA of chest shows no evidence of PE. Otherwise normal.   Delta trop is negative.   Blood sugar improved. Blood pressure improved. I discussed with patient regarding her subtherapeutic INR. She did not take her coumadin dose yesterday. She also did not take her blood pressure medication, likely resulting in her HTN today. At this time, patient with two negative trops after constant pain for 24+ hrs. She tells me the pain has been constant and she has never been without pain since it started. Her BNP is slightly elevated but she does not appear significantly overloaded, does not have signs of pulmonary edema on imaging, signs of respiratory distress/oxygen requirement here in the ED. At this time, patient is appropriate for discharge. Discussed results with patient. She states she feels better after pain medications. She has not had any vomiting here in the ED and has been ambulatory with no signs of gait ataxia. Re-evaluation shows patient with no signs of distress with equal pulses in all 4 extremities. I stressed the  importance of patient taking her meds appropriatley, including her coumadin. I also discussed with her regarding follow up with her cardiologist. She told me she actually has an appointment with them today and I encouraged her to keep that appointment. Additionally, I discussed with patient regarding obtaining a PCR covid test. Her antigen was negative and she does not have any additional symptoms. Patient declined further PCR testing. Patient assured me she does not need prescriptions for her medications. At this time, patient exhibits no emergent life-threatening condition that require further evaluation in ED. Discussed patient with Dr. Leonette Monarch who is agreeable. Patient had ample opportunity for questions and discussion. All patient's questions were answered with full understanding. Strict return precautions discussed. Patient expresses understanding and agreement to plan.   I was not notified of patient's blood pressure on discharge. Likely a  result of her missing her medications.   [name] was evaluated in Emergency Department on 07/28/2020 for the symptoms described in the history of present illness. She was evaluated in the context of the global COVID-19 pandemic, which necessitated consideration that the patient might be at risk for infection with the SARS-CoV-2 virus that causes COVID-19. Institutional protocols and algorithms that pertain to the evaluation of patients at risk for COVID-19 are in a state of rapid change based on information released by regulatory bodies including the CDC and federal and state organizations. These policies and algorithms were followed during the patient's care in the ED.  Portions of this note were generated with [employer]. Dictation errors may occur despite best attempts at proofreading.   Final Clinical Impression(s) / ED Diagnoses Final diagnoses:  Nonspecific chest pain  Hyperglycemia  Subtherapeutic international normalized ratio (INR)     Rx / DC Orders ED Discharge Orders    None       Desma Mcgregor 07/28/20 0617    Fatima Blank, MD 07/30/20 1820

## 2020-07-29 ENCOUNTER — Telehealth: Payer: Self-pay

## 2020-07-29 NOTE — Telephone Encounter (Signed)
Transition Care Management Unsuccessful Follow-up Telephone Call  Date of discharge and from where:  07/28/2020 [facility]  Attempts:  1st Attempt  Reason for unsuccessful TCM follow-up call:  Left voice message

## 2020-07-30 NOTE — Telephone Encounter (Signed)
Transition Care Management Unsuccessful Follow-up Telephone Call  Date of discharge and from where:  07/28/2020 [name]  Attempts:  2nd Attempt  Reason for unsuccessful TCM follow-up call:  Left voice message

## 2020-07-31 ENCOUNTER — Telehealth (HOSPITAL_COMMUNITY): Payer: Self-pay

## 2020-07-31 IMAGING — DX DG CHEST 1V PORT
1 series · 1 of 1 positions shown · non-contrast
Comparison: 02/19/2019

CLINICAL DATA: Chest pain and shortness of breath

EXAM:
PORTABLE CHEST 1 VIEW

[chest]
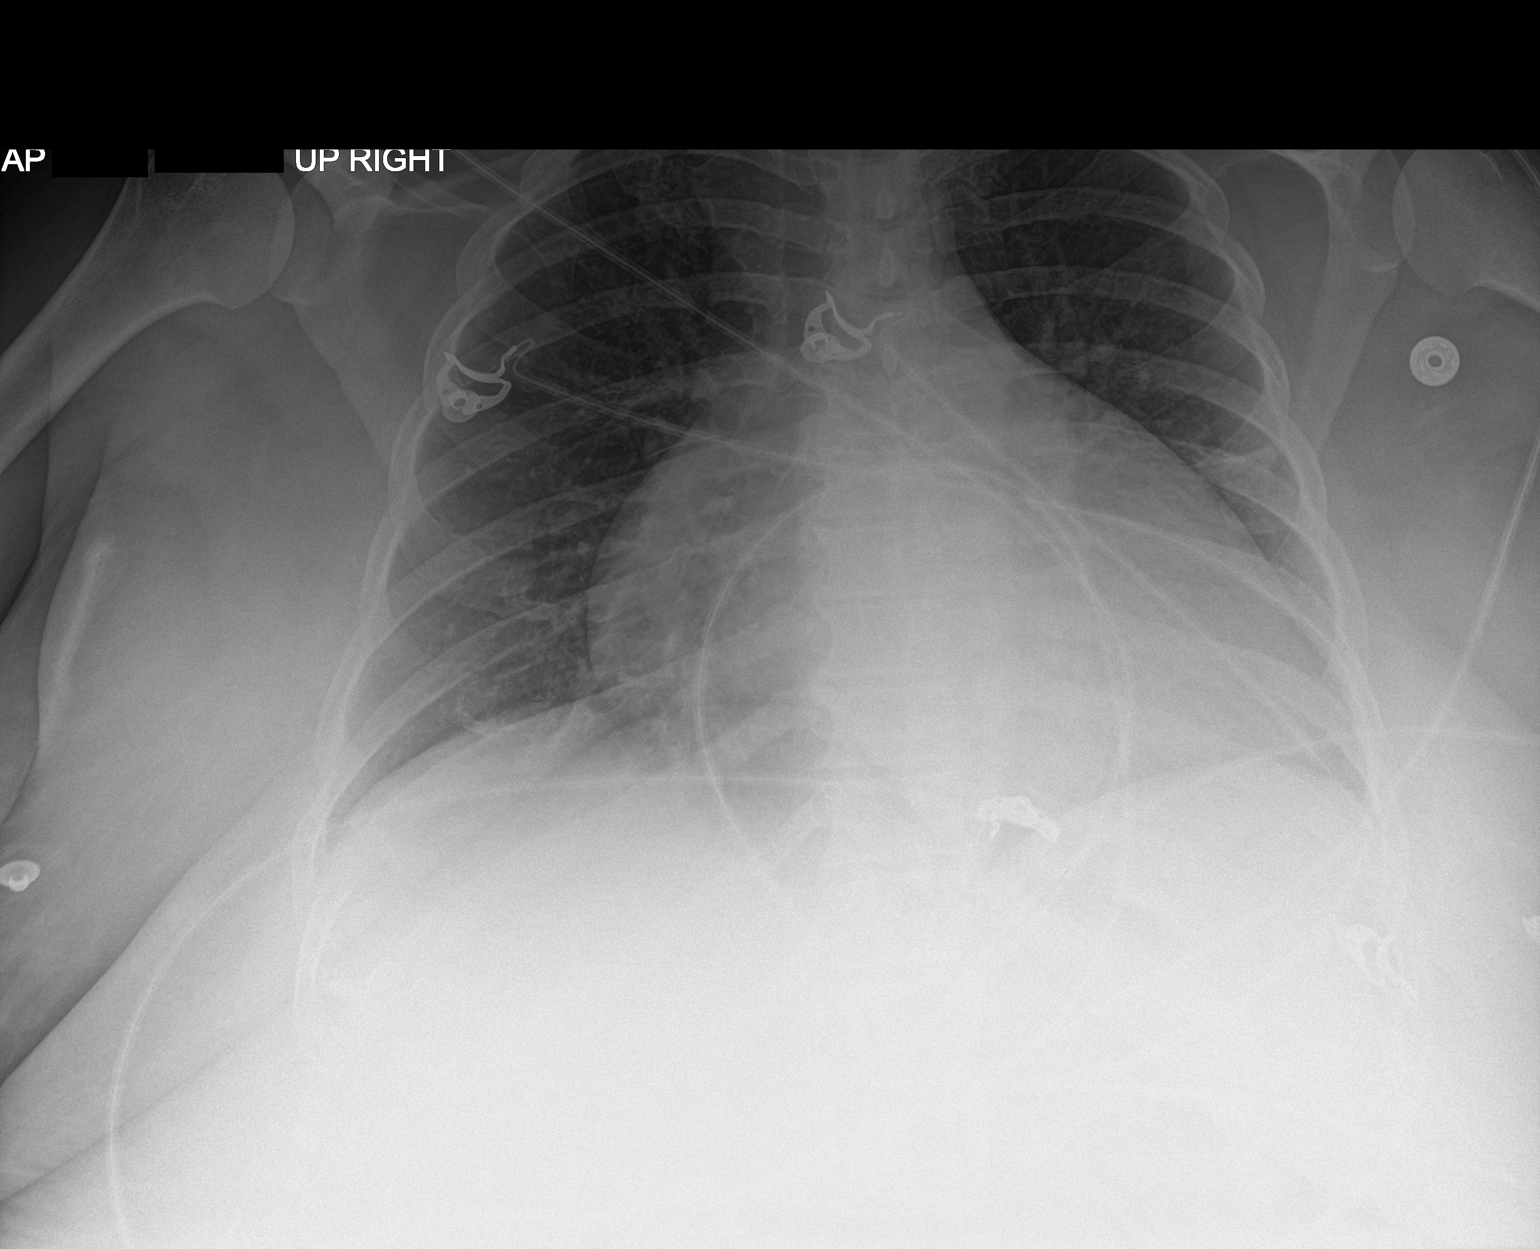

[1 of 1 positions shown; findings below may reference images not displayed]

FINDINGS: Cardiac shadow remains enlarged but stable. The lungs are well
aerated bilaterally. No focal infiltrate or sizable effusion is
seen. Minimal platelike atelectasis is noted in the left mid lung.
IMPRESSION: Minimal platelike atelectasis on the left.

## 2020-07-31 NOTE — Telephone Encounter (Signed)
I called [name] to schedule a visit. She did not answer so I left a message requesting that she call me back.   [name], EMT 07/31/20

## 2020-08-03 NOTE — Telephone Encounter (Signed)
Transition Care Management Unsuccessful Follow-up Telephone Call  Date of discharge and from where:  07/28/2020 [facility]  Attempts:  3rd Attempt  Reason for unsuccessful TCM follow-up call:  Left voice message   Patient has appointment with Cardiology 08/04/2020 at 12 pm.

## 2020-08-04 ENCOUNTER — Other Ambulatory Visit: Payer: Self-pay

## 2020-08-04 ENCOUNTER — Ambulatory Visit (INDEPENDENT_AMBULATORY_CARE_PROVIDER_SITE_OTHER): Payer: Medicaid Other | Admitting: *Deleted

## 2020-08-04 DIAGNOSIS — Z5181 Encounter for therapeutic drug level monitoring: Secondary | ICD-10-CM

## 2020-08-04 DIAGNOSIS — I480 Paroxysmal atrial fibrillation: Secondary | ICD-10-CM

## 2020-08-04 LAB — POCT INR: INR: 3.4 — AB (ref 2.0–3.0)

## 2020-08-04 NOTE — Patient Instructions (Signed)
Description   Hold warfarin today and then continue to take warfarin 1.5 tablets daily. Recheck INR in 10 days. Coumadin clinic [phone].  Please Bill Code [id number].

## 2020-08-07 ENCOUNTER — Telehealth (HOSPITAL_COMMUNITY): Payer: Self-pay | Admitting: Licensed Clinical Social Worker

## 2020-08-07 NOTE — Telephone Encounter (Signed)
Community Paramedic reached out to CSW to inform pt expressing that she is very depressed at this time.  CSW called pt to assess.  She expressed feelings of hopelessness, not feeling motivated to do anything including taking her medications, thoughts of wishing it were all over but no intention to harm herself.  Reports that between her health, her tenuous living situation with her daughter, and her financial strain she just can't see the light at the end of the tunnel.  CSW spoke with pt about getting help- pt does not feel particularly motivated at this time to do so but states if CSW helps get an appt she would commit to going.  CSW able to get pt appt with [facility] on 08/19/20 at 9am- pt informed of appt.  CSW also referred pt to new program, [employer], which assists pts at no cost with mental health concerns and connecting with community resources- awaiting response.  CSW will continue to follow and assist as needed  [name], [facility] [facility] Desk#: [phone] Cell#: [phone]

## 2020-08-13 ENCOUNTER — Other Ambulatory Visit: Payer: Self-pay

## 2020-08-13 ENCOUNTER — Ambulatory Visit (INDEPENDENT_AMBULATORY_CARE_PROVIDER_SITE_OTHER): Payer: Medicaid Other | Admitting: *Deleted

## 2020-08-13 ENCOUNTER — Telehealth: Payer: Self-pay | Admitting: Family Medicine

## 2020-08-13 DIAGNOSIS — I480 Paroxysmal atrial fibrillation: Secondary | ICD-10-CM

## 2020-08-13 DIAGNOSIS — Z5181 Encounter for therapeutic drug level monitoring: Secondary | ICD-10-CM

## 2020-08-13 LAB — POCT INR: INR: 2.3 (ref 2.0–3.0)

## 2020-08-13 NOTE — Telephone Encounter (Signed)
Attempted to reach [name] today to get her scheduled with the High Risk Managed Medicaid team. I had to leave a message with my name and number. I will reach out in 7-14 days if I have not heard back from her.

## 2020-08-13 NOTE — Patient Instructions (Addendum)
Description   Continue taking the dose you have been taking, which is: 1.5 tablets daily except 1 tablet on Sundays. Recheck INR in 2 weeks. Coumadin clinic [phone].  Please Bill Code [id number].

## 2020-08-14 ENCOUNTER — Telehealth (HOSPITAL_COMMUNITY): Payer: Self-pay | Admitting: Licensed Clinical Social Worker

## 2020-08-14 NOTE — Telephone Encounter (Signed)
CSW called pt to check in regarding pt depression.  Pt reports she has been feeling better this week- is trying not to focus on things outside of her control.  States her anniversary with her boyfriend is today so she is looking forward to celebrating that.  Pt reports she thinks she missed a call from the [employer] program- CSW encouraged her to return their phone call today so she can get set up with an initial appt.  CSW also inquired about pt housing- was informed by Paramedic that pt might have section 8.  Pt confirms she received a letter from section 8 last year saying she was approved but hasn't heard anything back regarding an apartment.  Patient has approval letter with case workers name and contact on it- will send CSW a picture Monday so I can assist in following up.  Will continue to follow and assist as needed  [name], [place] [facility] [place] [facility] Desk#: [phone] Cell#: [phone]

## 2020-08-17 ENCOUNTER — Telehealth (HOSPITAL_COMMUNITY): Payer: Self-pay | Admitting: Licensed Clinical Social Worker

## 2020-08-17 NOTE — Telephone Encounter (Signed)
Pt sent CSW copy of letter from [employer] which states that pt was approved for Reasonable Accommodation Waitlist and would be notified when a unit becomes available.  Pt property manager listed as [place] called [employer] and left message with Seymour Bars to discuss pt status on waitlist.  Will continue to follow and assist as needed  [name], [place] [facility] Desk#: [phone] Cell#: [phone]

## 2020-08-18 ENCOUNTER — Telehealth (HOSPITAL_COMMUNITY): Payer: Self-pay | Admitting: Licensed Clinical Social Worker

## 2020-08-18 NOTE — Telephone Encounter (Signed)
CSW sent pt reminder for appt with [facility] tomorrow at [place].  Provided pt with address and phone number   Will continue to follow and assist as needed  [name], [facility] Desk#: [phone] Cell#: [phone]

## 2020-08-19 ENCOUNTER — Ambulatory Visit (INDEPENDENT_AMBULATORY_CARE_PROVIDER_SITE_OTHER): Payer: Medicaid Other | Admitting: Licensed Clinical Social Worker

## 2020-08-19 ENCOUNTER — Telehealth (HOSPITAL_COMMUNITY): Payer: Self-pay | Admitting: Licensed Clinical Social Worker

## 2020-08-19 ENCOUNTER — Other Ambulatory Visit: Payer: Self-pay

## 2020-08-19 DIAGNOSIS — F411 Generalized anxiety disorder: Secondary | ICD-10-CM

## 2020-08-19 DIAGNOSIS — F331 Major depressive disorder, recurrent, moderate: Secondary | ICD-10-CM | POA: Diagnosis not present
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
## 2020-08-19 NOTE — Telephone Encounter (Signed)
CSW called and left message with multiple workers at [employer] to discuss pt apartment situation.  Received a call back and was supplied with supervisor number, Vicente [phone]- CSW will reach out to get update on pt case.  Pt confirms she was able to get to therapy appt this morning- isn't sure how she will like it as she has trouble opening up but plans to keep trying.  [name], LCSW Clinical Social Worker [facility] Desk#: [phone] Cell#: [phone]

## 2020-08-20 ENCOUNTER — Telehealth (HOSPITAL_COMMUNITY): Payer: Self-pay | Admitting: Licensed Clinical Social Worker

## 2020-08-20 NOTE — Telephone Encounter (Signed)
CSW spoke with Ms. [employer] with [employer] who states no issues with pt approval for new apartment they just have a very limited number of units available which meet her accomodation needs and turn over has been very low with COVID.  Ms. Lucia Gaskins provided CSW with number to another case worker in the reasonable accomodation department who she stated would be the best person to contact in the future if wanting to check in- Calton Golds- [phone]  CSW updated pt regarding above- will continue to follow and assist as needed  [name], [place] [facility] Desk#: [phone] Cell#: [phone]

## 2020-09-02 ENCOUNTER — Other Ambulatory Visit: Payer: Self-pay

## 2020-09-02 ENCOUNTER — Ambulatory Visit (INDEPENDENT_AMBULATORY_CARE_PROVIDER_SITE_OTHER): Payer: Medicaid Other | Admitting: *Deleted

## 2020-09-02 DIAGNOSIS — I480 Paroxysmal atrial fibrillation: Secondary | ICD-10-CM

## 2020-09-02 DIAGNOSIS — Z5181 Encounter for therapeutic drug level monitoring: Secondary | ICD-10-CM

## 2020-09-02 LAB — POCT INR: INR: 1.2 — AB (ref 2.0–3.0)

## 2020-09-02 MED FILL — traZODone HCL 50 MG TABS: 50 | 30 days supply | Qty: 30 | Fill #0

## 2020-09-02 MED FILL — hydrOXYzine HCL 10 MG TABS: 10 | 10 days supply | Qty: 30 | Fill #0

## 2020-09-02 NOTE — Patient Instructions (Signed)
Description   Today and tomorrow take 2 tablets then continue taking the dose you have been taking, which is: 1.5 tablets daily except 1 tablet on Sundays. Recheck INR in 1 week. Coumadin clinic [phone].  Please Bill Code [id number].

## 2020-09-03 ENCOUNTER — Telehealth (HOSPITAL_COMMUNITY): Payer: Self-pay | Admitting: Licensed Clinical Social Worker

## 2020-09-03 ENCOUNTER — Other Ambulatory Visit: Payer: Self-pay | Admitting: Obstetrics and Gynecology

## 2020-09-03 ENCOUNTER — Encounter (HOSPITAL_COMMUNITY): Payer: Self-pay

## 2020-09-03 ENCOUNTER — Ambulatory Visit (HOSPITAL_BASED_OUTPATIENT_CLINIC_OR_DEPARTMENT_OTHER)
Admission: RE | Admit: 2020-09-03 | Discharge: 2020-09-03 | Disposition: A | Discharge status: Home / Self Care | Payer: Medicaid Other | Source: Ambulatory Visit | Attending: Adult Health | Admitting: Adult Health

## 2020-09-03 VITALS — BP 150/100 | HR 98 | Wt 194.4 lb

## 2020-09-03 DIAGNOSIS — Z794 Long term (current) use of insulin: Secondary | ICD-10-CM | POA: Insufficient documentation

## 2020-09-03 DIAGNOSIS — I5022 Chronic systolic (congestive) heart failure: Secondary | ICD-10-CM | POA: Insufficient documentation

## 2020-09-03 DIAGNOSIS — Z888 Allergy status to other drugs, medicaments and biological substances status: Secondary | ICD-10-CM | POA: Insufficient documentation

## 2020-09-03 DIAGNOSIS — E282 Polycystic ovarian syndrome: Secondary | ICD-10-CM | POA: Insufficient documentation

## 2020-09-03 DIAGNOSIS — R0609 Other forms of dyspnea: Secondary | ICD-10-CM

## 2020-09-03 DIAGNOSIS — I5020 Unspecified systolic (congestive) heart failure: Secondary | ICD-10-CM

## 2020-09-03 DIAGNOSIS — I480 Paroxysmal atrial fibrillation: Secondary | ICD-10-CM

## 2020-09-03 DIAGNOSIS — I5023 Acute on chronic systolic (congestive) heart failure: Secondary | ICD-10-CM | POA: Diagnosis not present

## 2020-09-03 DIAGNOSIS — R0600 Dyspnea, unspecified: Secondary | ICD-10-CM | POA: Diagnosis not present

## 2020-09-03 DIAGNOSIS — I2510 Atherosclerotic heart disease of native coronary artery without angina pectoris: Secondary | ICD-10-CM | POA: Insufficient documentation

## 2020-09-03 DIAGNOSIS — Z8249 Family history of ischemic heart disease and other diseases of the circulatory system: Secondary | ICD-10-CM | POA: Insufficient documentation

## 2020-09-03 DIAGNOSIS — Z006 Encounter for examination for normal comparison and control in clinical research program: Secondary | ICD-10-CM

## 2020-09-03 DIAGNOSIS — Z886 Allergy status to analgesic agent status: Secondary | ICD-10-CM | POA: Insufficient documentation

## 2020-09-03 DIAGNOSIS — I10 Essential (primary) hypertension: Secondary | ICD-10-CM | POA: Diagnosis not present

## 2020-09-03 DIAGNOSIS — Z8742 Personal history of other diseases of the female genital tract: Secondary | ICD-10-CM | POA: Insufficient documentation

## 2020-09-03 DIAGNOSIS — Z6839 Body mass index (BMI) 39.0-39.9, adult: Secondary | ICD-10-CM | POA: Insufficient documentation

## 2020-09-03 DIAGNOSIS — I513 Intracardiac thrombosis, not elsewhere classified: Secondary | ICD-10-CM | POA: Insufficient documentation

## 2020-09-03 DIAGNOSIS — Z7984 Long term (current) use of oral hypoglycemic drugs: Secondary | ICD-10-CM | POA: Insufficient documentation

## 2020-09-03 DIAGNOSIS — E1165 Type 2 diabetes mellitus with hyperglycemia: Secondary | ICD-10-CM | POA: Insufficient documentation

## 2020-09-03 DIAGNOSIS — Z596 Low income: Secondary | ICD-10-CM | POA: Insufficient documentation

## 2020-09-03 DIAGNOSIS — Z79899 Other long term (current) drug therapy: Secondary | ICD-10-CM | POA: Insufficient documentation

## 2020-09-03 DIAGNOSIS — E669 Obesity, unspecified: Secondary | ICD-10-CM | POA: Insufficient documentation

## 2020-09-03 DIAGNOSIS — I110 Hypertensive heart disease with heart failure: Secondary | ICD-10-CM | POA: Insufficient documentation

## 2020-09-03 DIAGNOSIS — E785 Hyperlipidemia, unspecified: Secondary | ICD-10-CM | POA: Insufficient documentation

## 2020-09-03 DIAGNOSIS — Z7901 Long term (current) use of anticoagulants: Secondary | ICD-10-CM | POA: Insufficient documentation

## 2020-09-03 LAB — BASIC METABOLIC PANEL
Anion gap: 11 (ref 5–15)
BUN: 5 mg/dL — ABNORMAL LOW (ref 6–20)
CO2: 23 mmol/L (ref 22–32)
Calcium: 9 mg/dL (ref 8.9–10.3)
Chloride: 99 mmol/L (ref 98–111)
Creatinine, Ser: 0.79 mg/dL (ref 0.44–1.00)
GFR, Estimated: >60 mL/min (ref >60)
Glucose, Bld: 437 mg/dL — ABNORMAL HIGH (ref 70–99)
Potassium: 3.8 mmol/L (ref 3.5–5.1)
Sodium: 133 mmol/L — ABNORMAL LOW (ref 135–145)

## 2020-09-03 LAB — POCT I-STAT, CHEM 8
BUN: 5 mg/dL — ABNORMAL LOW (ref 6–20)
Calcium, Ion: 1.2 mmol/L (ref 1.15–1.40)
Chloride: 100 mmol/L (ref 98–111)
Creatinine, Ser: 0.5 mg/dL (ref 0.44–1.00)
Glucose, Bld: 428 mg/dL — ABNORMAL HIGH (ref 70–99)
HCT: 45 % (ref 36.0–46.0)
Hemoglobin: 15.3 g/dL — ABNORMAL HIGH (ref 12.0–15.0)
Potassium: 3.8 mmol/L (ref 3.5–5.1)
Sodium: 135 mmol/L (ref 135–145)
TCO2: 22 mmol/L (ref 22–32)

## 2020-09-03 LAB — MAGNESIUM: Magnesium: 1.8 mg/dL (ref 1.7–2.4)

## 2020-09-03 NOTE — Research (Addendum)
At Home Informed Consent   Subject Name: [name]  Subject met inclusion and exclusion criteria.  The informed consent form, study requirements and expectations were reviewed with the subject and questions and concerns were addressed prior to the signing of the consent form.  The subject verbalized understanding of the trial requirements.  The subject agreed to participate in the At-Home trial and signed the informed consent at 1108 on 09/03/2020.  The informed consent was obtained prior to performance of any protocol-specific procedures for the subject.  A copy of the signed informed consent was given to the subject and a copy was placed in the subject's medical record.   Sherlyn Lees

## 2020-09-03 NOTE — Research (Addendum)
  [employer] Protocol [id number] AT [facility] Pilot  Patient Global Assessment Visual Analog Scale (EQ-VAS)        Subject Number:   16-008  Date Completed:        09/03/2020  Study Day:   Day 0       . We would like to know how good or bad your health is TODAY. Marland Kitchen This scale is numbered 0 to 100. . 100 means the best health you can imagine.       0 means the worst health you can imagine. . Please let me know the number you feel indicated how your health is TODAY.    VAS SCORE - YOUR HEALTH TODAY : 50      Avoiding Treatment in the Hospital with [employer] for the Management of Congestion in Heart Failure - A Pilot Study  AT HOME-HF Pilot   Subject Number:   [id number]  Date Completed:        09/03/2020  Study Day:   Day 0   Vital Signs: Respiration Rate:  (bpm) 16 ReDS:  (%) 42

## 2020-09-03 NOTE — Patient Outreach (Signed)
Care Coordination  09/03/2020  KEARRA CALKIN [birth year] [id number]    Medicaid Managed Care   Unsuccessful Outreach Note  09/03/2020 Name: ARIS MOMAN MRN: [id number] DOB: 02/11/1976  Referred by: Gifford Shave, MD Reason for referral : High Risk Managed Medicaid (Unsuccessful telephone outreach)   Third unsuccessful telephone outreach was attempted today. The patient was referred to the case management team for assistance with care management and care coordination. The patient's primary care provider has been notified of our unsuccessful attempts to make or maintain contact with the patient. The care management team is pleased to engage with this patient at any time in the future should he/she be interested in assistance from the care management team.   Follow Up Plan: We have been unable to make contact with the patient. The care management team is available to follow up with the patient after provider conversation with the patient regarding recommendation for care management engagement and subsequent re-referral to the care management team.    Aida Raider RN, BSN [place] Management Coordinator - Managed [facility] High Risk [phone].

## 2020-09-03 NOTE — Progress Notes (Signed)
CSW met with pt during clinic visit to check in regarding mental health and to discuss transportation resources as pt will need to come to clinic more frequently for a research study  Pt reports she doesn't have another therapy appt until March with [facility] but that she did hear back from [employer] program and she has phone appt this afternoon at 1:30pm  CSW will touch base with pt after appt to check in.  CSW enrolled pt with [facility] services- provided pt with a [facility] card and informed she just needs to call and tell them when and where appt is.  Will continue to follow and assist as needed  [name], [place] [facility] Desk#: [phone] Cell#: [phone]

## 2020-09-03 NOTE — Research (Signed)
     AT [facility] Cardiopulmonary Examination & Physical Examination Summary    Cardiopulmonary Exam  Was the CP Exam Performed? [x]  Yes (Complete Below) []  No (Complete Protocol Deviation)      Heart Sounds  S3: [x]  Absent []  Present   S4: [x]  Absent []  Present  Murmur  Murmur: []  Yes [x]  No   If Yes:  []  Systolic []  Diastolic   If yes select grade: []  I/VI []  II/VI []  III/VI []  IV/VI []  V/VI []  VI/VI   Chest Sounds  Rales: []  Yes [x]  No   If yes check one: []  ? 1/3 of lung fields full []  1/2 of lung fields full []  > 1/2 of lung fields full  If yes check one: []  Left []  Right []  Bilateral    Edema   Edema (pedal): []  Absent []  Trace []  1+ (slight) [x]  2+ (moderate) []  3+ (severe)    Hepatomegaly  Hepatomegaly: [x]  None []  < 2 cm []  2-4 cm []  > 4 cm  Peripheral Pulses  Peripheral Pulses: [x]  Normal []  Abnormal   If Abnormal, explain:             Physical Exam   Was the physical Exam Performed?  [x]  Yes        []  No (complete protocol deviation)    . Lungs/Chest?     [x]  Normal        []  Abnormal        []  Not Done  If abnormal, was it clinically significant?     []  Yes   []  No       . Dermatologic (Abdominal Area):   [x]  Normal        []  Abnormal        []  Not Done   If abnormal, was it clinically significant?  []  Yes   []  No       . Periphery:     [x]  Normal          []  Abnormal         []  Not Done   If abnormal, was it clinically significant?  []  Yes    []  No      . Other Body System Examined?  []  Yes        [x]  No          New York Heart Association (NYHA) Heart Failure Classification Indicate the subject's current NYHA Classification at the present time (check only one) Was the NYHA Performed:    ?[x]  Yes  ?  [] No (Complete Protocol Deviation)     []   Class I:  Patients have cardiac disease but without the resulting limitations of physical activity. Ordinary physical activity does  not cause undue fatigue, palpitation, dyspnea or anginal pain.     []   Class II:    Patients have cardiac disease resulting in slight limitation of physical activity. They are comfortable at rest. Ordinary physical activity results in fatigue, palpitation, dyspnea or anginal pain.     [x]   Class III:  Patients have cardiac disease resulting in marked limitation of physical activity. They are comfortable at rest. Less than ordinary physical activity causes fatigue, palpitation, dyspnea or anginal pain.     []   Class IV: Patients have cardiac disease resulting in inability to carry on any physical activity without discomfort. Symptoms of cardiac insufficiency or of the anginal syndrome may be present even at rest. If any physical activity is undertaken, discomfort is increased.

## 2020-09-03 NOTE — Research (Signed)
                6 Minute Walk Was the 6 minute walk performed:    ?[x]  Yes ?  [] No (Complete Protocol Deviation)  Vitals Before 6 Minute Walk Systolic Blood Pressure (mmHg) Diastolic Blood Pressure (mmHg) Heart Rate (BPM) Respiration Rate (BPM)   150 100 98 16  Vitals After 6 Minute Walk Systolic Blood Pressure (mmHg) Diastolic Blood Pressure (mmHg) Heart Rate (BPM) Respiration Rate (BPM)   152 98 105 22  Measured Before 6 Minute Walk Borg Scale Level of Shortness of breath Moderate  Borg Scale Level of Fatigue very very slight   Measured After 6 Minute Walk Borg Scale Level of Shortness of breath somewhat severe  Borg Scale Level of Fatigue moderate   Distance covered in 6 minutes ??? 152: meters    Did the subject stop or pause before 6 minutes  ? [x] Yes (Complete Below) ?  [] No   Reason for stopping Reason Response If Yes, Comments   Shortness of breath (HF-related) ? Yes[x]   ? No[]     Light headedness (HF-related) ? Yes[]   ? No[x]     Fatigue (HF-related) ? Yes[x]   ? No[]     Other HF-related symptom ? Yes[]   ? No[x]     Non-HF symptom(s) ? Yes[]   ? No[x]

## 2020-09-03 NOTE — Progress Notes (Signed)
Advanced [facility] Failure Clinic Note    PCP: Gifford Shave, MD PCP-Cardiologist: Dr. Aundra Dubin  EP- Dr. Curt Bears   HPI: 45 y.o. with history of type 2 diabetes (poorly controlled, last hgbA1c > 15), poorly controlled HTN, hyperlipidemia, paroxysmal atrial fibrillation, and chronic systolic CHF of uncertain etiology.  Patient had an echo in 6/20 as outpatient due to exertional dyspnea. This showed EF 20-25%, moderate RV dysfunction.  She was also noted to have paroxysmal atrial fibrillation.  She was started on Xarelto.  In 7/20, she was admitted with DKA. In 8/20, she was admitted with CHF exacerbation and diuresed.  She was sent home on Lasix 40 mg po bid.   Readmitted on 9/20 with CHF exacerbation, in the setting of elevated BP.  Diuresed with IV lasix.  LHC showed nonobstructive CAD.  RHC showed R>L heart failure with low PAPI suggesting significant RV dysfunction. She was transitioned to PO torsemide and treated w/ guidelines directed medical therapy w/ Entresto, Spironolactone, Coreg and digoxin. She was also transitioned off of Xarelto and placed on Coumadin.  Readmitted 08/30/35 with A/C systolic heart failure. She had not been taking her lasix because she says it was on her AVS to stop it.  Diuresed with IV lasix and transitioned to torsemide 40 mg daily. Weight day before discharge was 190 pounds. Echo with LV EF <20%, RV severely decreased systolic function.   Echo in 6/21 showed EF 35%, moderately decreased RV systolic function, moderate LAE, mild MR.  Echo was done today and reviewed, EF 35-40%, global hypokinesis, mild LVH, normal RV, IVC normal.   Recently seen by Dr. Aundra Dubin 11/2 and was fluid overloaded. Wt was up 7 lb. ReDs clip elevated at 40%. She was symptomatic, NYHA Class III. Torsemide was increased to 60 mg daily. She was also in sinus tach but already on max dose of Coreg.  Ivabradine 5 mg bid was added. She was also ordered to get a cMRI to look for scar/infiltrative disease  and quantify EF. This has not been done yet.   F/u on 06/16/20. Wt only down 2 lb. ReDs Clip still elevated but down slightly at 38%. EKG shows improved HR. In NSR. HR 84 bpm. BP elevated at 150/100 but she has not yet taken AM meds. Concern that she may have a yeast infection. Notes vaginal irritation/ itching but no dysuria. Has had yeast infections in the past. Also concern that she may be pregnant. She is 7 days late w/ her period. Not using regular birth control.   Today she returns for HF follow up.Overall feeling fair. Having issues with depression. Recently started seeing a therapist. SOB with exertion. Denies PND/Orthopnea. Appetite ok. Likes to snack on Doritos and brownies.  No fever or chills. Weight at home 195-199 pounds. She is sexually active and does not plan to prevent pregnancy. She is says if she gets pregnant that it will be Gods will.  Taking all medications. Lives alone. Eating out more. Followed by HF Paramedicine. She is on medical leave. 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   Labs (5/21): hgbA1c 9.3, BNP 76, K 3.9, creatinine 1.1 Labs (6/21): digoxin 0.4, K 4.1, creatinine 0.77 Labs (10/21): BNP 25, K 4.1, creatinine 0.91 Labs (11/21): K 3.7, creatinine 0.6, digoxin <0.2 Labs (12/21): K 4.2, creatinine 0.83 Labs (07/28/2020): K 3.9 Creatinine 0/7   PMH: 1. Type 2 diabetes: H/o DKA 2. HTN 3. OSA 4. Obesity 5. Polycystic ovarian syndrome 6. Chronic  systolic CHF: Nonischemic cardiomyopathy - Echo (6/20) with EF 20-25%, moderate RV dysfunction.  - LHC/RHC (9/20): No significant CAD.  - Echo (3/21): EF <20%, moderate LVH with global HK, severely reduced RV systolic function.  - Echo (6/21): EF 35%, moderately decreased RV systolic function, moderate LAE, mild MR. - Echo (11/21): EF 35-40%, global hypokinesis, mild LVH, normal RV, IVC normal.  7. Hyperlipidemia 8. Atrial fibrillation: Paroxysmal.  9. History of LV thrombus.    Current  Outpatient Medications  Medication Sig Dispense Refill  . Accu-Chek Softclix Lancets lancets Use as instructed 100 each 12  . albuterol (VENTOLIN HFA) 108 (90 Base) MCG/ACT inhaler Inhale 2 puffs into the lungs every 6 (six) hours as needed for wheezing or shortness of breath. 18 g 1  . atorvastatin (LIPITOR) 40 MG tablet TAKE 1 TABLET BY MOUTH DAILY AT 6 PM. 30 tablet 2  . Blood Glucose Monitoring Suppl (ACCU-CHEK AVIVA PLUS) w/Device KIT 1 application by Does not apply route QID. Check fasting sugars as well as throughout the day 1 kit 0  . busPIRone (BUSPAR) 7.5 MG tablet Take 7.5 mg by mouth 2 (two) times daily.    . carvedilol (COREG) 25 MG tablet Take 1 tablet (25 mg total) by mouth 2 (two) times daily with a meal. 180 tablet 3  . digoxin (LANOXIN) 0.125 MG tablet Take 1 tablet (0.125 mg total) by mouth daily. 30 tablet 3  . empagliflozin (JARDIANCE) 25 MG TABS tablet Take 25 mg by mouth daily. 90 tablet 3  . furosemide (LASIX) 20 MG tablet Take 20 mg by mouth daily.    Marland Kitchen gabapentin (NEURONTIN) 100 MG capsule Take 100 mg by mouth 2 (two) times daily.    Marland Kitchen glucose blood (ACCU-CHEK AVIVA PLUS) test strip Check CBG 2 times per day 100 each 12  . glucose blood (AGAMATRIX PRESTO TEST) test strip Use as instructed 100 each 12  . hydrOXYzine (ATARAX/VISTARIL) 10 MG tablet TAKE 1 TABLET (10 MG TOTAL) BY MOUTH 3 (THREE) TIMES DAILY AS NEEDED. 30 tablet 0  . insulin glargine (LANTUS) 100 UNIT/ML injection Inject 0.22 mLs (22 Units total) into the skin daily. 10 mL 11  . isosorbide-hydrALAZINE (BIDIL) 20-37.5 MG tablet Take 0.5 tablets by mouth 3 (three) times daily. 90 tablet 3  . ivabradine (CORLANOR) 5 MG TABS tablet Take 1 tablet (5 mg total) by mouth 2 (two) times daily with a meal. 60 tablet 3  . Lancets Misc. (ACCU-CHEK SOFTCLIX LANCET DEV) KIT 1 application by Does not apply route 2 (two) times daily. 1 kit 0  . meclizine (ANTIVERT) 12.5 MG tablet Take 1 tablet (12.5 mg total) by mouth 3  (three) times daily as needed for dizziness. 30 tablet 0  . potassium chloride SA (KLOR-CON) 20 MEQ tablet Take 20 mEq by mouth See admin instructions. Take 1 tablet by mouth every Monday Wednesday and Friday    . sacubitril-valsartan (ENTRESTO) 97-103 MG Take 1 tablet by mouth 2 (two) times daily. 180 tablet 3  . sertraline (ZOLOFT) 25 MG tablet Take 1 tablet (25 mg total) by mouth daily. 30 tablet 3  . spironolactone (ALDACTONE) 25 MG tablet Take 25 mg by mouth daily.    Marland Kitchen torsemide (DEMADEX) 20 MG tablet Take 100 mg by mouth daily.    . traZODone (DESYREL) 50 MG tablet TAKE 1 TABLET (50 MG TOTAL) BY MOUTH AT BEDTIME AS NEEDED FOR SLEEP. 30 tablet 0  . warfarin (COUMADIN) 7.5 MG tablet Take as directed by Coumadin Clinic (  Patient taking differently: Take 15 mg by mouth daily.) 45 tablet 0   No current facility-administered medications for this encounter.    Allergies  Allergen Reactions  . Shellfish Allergy Swelling    Airway involvement including EMS - Has Epi-Pen  . Dulaglutide Nausea And Vomiting and Other (See Comments)    Multiple episodes of vomiting over multiple days  . Metformin And Related Diarrhea  . Ibuprofen     Per PCP interferes with daily meds       Social History   Socioeconomic History  . Marital status: Single    Spouse name: Not on file  . Number of children: Not on file  . Years of education: Not on file  . Highest education level: Not on file  Occupational History  . Not on file  Tobacco Use  . Smoking status: Never Smoker  . Smokeless tobacco: Never Used  Vaping Use  . Vaping Use: Never used  Substance and Sexual Activity  . Alcohol use: No    Alcohol/week: 0.0 standard drinks  . Drug use: No  . Sexual activity: Yes  Other Topics Concern  . Not on file  Social History Narrative  . Not on file   Social Determinants of Health   Financial Resource Strain: High Risk  . Difficulty of Paying Living Expenses: Hard  Food Insecurity: No Food  Insecurity  . Worried About Charity fundraiser in the Last Year: Never true  . Ran Out of Food in the Last Year: Never true  Transportation Needs: No Transportation Needs  . Lack of Transportation (Medical): No  . Lack of Transportation (Non-Medical): No  Physical Activity: Insufficiently Active  . Days of Exercise per Week: 7 days  . Minutes of Exercise per Session: 20 min  Stress: Stress Concern Present  . Feeling of Stress : Very much  Social Connections: Not on file  Intimate Partner Violence: Not At Risk  . Fear of Current or Ex-Partner: No  . Emotionally Abused: No  . Physically Abused: No  . Sexually Abused: No      Family History  Problem Relation Age of Onset  . Diabetes Mother   . Hypertension Mother   . Hypertension Father   . Diabetes Father     Vitals:   09/03/20 1017  BP: (!) 150/100  Pulse: 98  SpO2: 99%  Weight: 88.2 kg (194 lb 6.4 oz)    Wt Readings from Last 3 Encounters:  09/03/20 88.2 kg (194 lb 6.4 oz)  07/08/20 90.7 kg (200 lb)  07/03/20 88.5 kg (195 lb)   Rdds Clip 42%.   PHYSICAL EXAM: General:  Walked in the clinic.  No resp difficulty HEENT: normal Neck: supple. JVP 11-12. Carotids 2+ bilat; no bruits. No lymphadenopathy or thryomegaly appreciated. Cor: PMI nondisplaced. Regular rate & rhythm. No rubs, gallops or murmurs. Lungs: clear Abdomen: soft, nontender, distended. No hepatosplenomegaly. No bruits or masses. Good bowel sounds. Extremities: no cyanosis, clubbing, rash, R and LLE 1+ edema Neuro: alert & orientedx3, cranial nerves grossly intact. moves all 4 extremities w/o difficulty. Affect pleasant   ASSESSMENT & PLAN:  1.Chronic systolic CHF:  Nonischemic cardiomyopathy, diagnosed in 6/20. Repeat Echo 03/2019 showed EF 20-25%, diffuse hypokinesis, LV thrombus, moderate RV systolic dysfunction, mild MR, moderate TR. Echo in 3/21 with EF <30%, severe RV dysfunction. She has a history of paroxysmal atrial fibrillation but seems to  be mostly in sinus rhythm. This is not likely to be a tachycardia-mediated cardiomyopathy. Poorly controlled  diabetes itself can cause a cardiomyopathy, as can poorly controlled HTN. Myocarditis is a potential cause of her cardiomyopathy. [place] 9/20 showed nonobstructive CAD. RHC showed R>L heart failure with low PAPI suggesting significant RV dysfunction.  Echo in 6/21 with EF up to 35%. Echo 11/21 EF 35-40% Garde I DD -NYHA III.  Reds Clip 42%.  - Volume status elevated. Screened for AT Home and consent completed. Randomized to treatment arm. - Today she will take 80 mg furoscix and 80 mg tomorrow.  - For some reason she is on oral lasix and torsemide. Stop lasix.  - Continue Entresto 97/103 bid.  - Continue torsemide 100 mg daily.  - Continue carvedilol 25 mg bid. - Continue ivabradine 5 mg bid.  - Continue spironolactone 25 mg daily.  - Continue digoxin. Last level <0.2 on 06/16/20. - Continue empagliflozin.  - Continue Bidil 1/2 tab tid, would not increase with headaches.   - Hold off on CPX until volume status improved.   - She previously had been trying to get pregnant (has been difficult with polycystic ovarian syndrome). She has 2 grown children already.With her cardiomyopathy and active CHF, pregnancy would create a significant mortality risk for her. A number of her cardiac meds to control her CHF are feto-toxic.  - discussed importance of strict regular birth control/ use of condoms to avoid pregnancy. She is aware of risk but not planning to try and avoid pregnancy.    2. LV thrombus - LV thrombus noted on echo 9/20.  - Compliant w/ coumadin. No bleeding issues.  - INRs followed by PCP.  3. Atrial fibrillation, Paroxysmal.  - Continue warfarin (using warfarin rather than DOAC with h/o LV thrombus).  - Would consider atrial fibrillation ablation in the future, if recurrence, given CASTLE-HF data.   4. Type 2 diabetes  -Needs follow up with PCP - Continue Jardiance +  insulin glargine. - Last A1c 9.3 on 5/21.   5. Hypertension  -Elevated. Hopefully this will improve with diuresis  6. Yeast Infection  - Resolved with fluconazole. If reoccurs will need to stop.  - Continue Jardiance for both systolic heart failure and DM.    Follow up tomorrow to reassess volume status. Check Reds Clip, BMET, mag   Darrick Grinder, NP 09/03/20

## 2020-09-03 NOTE — Telephone Encounter (Signed)
CSW informed that pt is confused regarding how to use [facility] and has clinic appt tomorrow.  CSW called pt to discuss.  She states her boyfriend can drive her to appt tomorrow but will need a ride home and doesn't know how to utilize [employer].  CSW informed pt how to arrange but suggested she call me tomorrow after her appts are done and we could call them together to arrange ride home as well as to Mondays appt so she understands how it works.  Pt agreeable to plan and will call CSW tomorrow when she is ready to leave clinic.  [name], LCSW Clinical Social Worker [facility] Clinic Desk#: [phone] Cell#: [phone]

## 2020-09-03 NOTE — Research (Signed)
  Avoiding Treatment in the Hospital with Furoscix for the Management of Congestion in Heart Failure - A Pilot Study  AT HOME-HF Pilot       [x]  Informed Consent (done before any of the following procedures were completed)  [x]  Limited Physical Exam   [x]  NYHA Classification  [x]  Eligibility Criteria  [x]  Medical History and Subject Demographics   [x]  Administration of the Composite Congestion Score (CCS)  [x]  Administration of the 5-Point Current Dyspnea Score  [x]  Weight & Height  [x]  Vital Signs  [x]  % Lung Fluid Measurement (ReDS)  [x]  Eligibility Criteria Blood work performed along with NT-ProBNP   []  Urine Pregnancy Test on females of childbearing potential  [x]  Administration of the [employer] Cardiomyopathy Questionnaire (KCCQ-12)  [x]  Administration of the patient global assessment visual analog scale (VAS)  [x]  Administration of the six minute walk test  [x]  Assessment of any Adverse Events  [x]  Review of concomitant medications   [x]  Scheduling next study visit

## 2020-09-03 NOTE — Research (Signed)
            Composite Congestion Score (CCS) Indicate the subject's current CCS Score at the present time (select for each signs/symptoms) Was CCS Performed:   ?  [x] Yes ?  [] No (Complete Protocol Deviation)     Signs/Symptoms 0 - None  1 -Seldom 2 -Frequent 3 -Continuous  Dyspnoea []  []  []  []   Orthopnoea []  []  [x]  []   Fatigue []  []  [x]  []    Signs/Symptoms 0 - <=6  1 - 6-9 2 - 10-15 3 - >=15  JVD (cm H2O) [x]  []  []  []    Signs/Symptoms 0 -None  1 -Bases 2 -To <50% 3 -To >50%  Rales [x]  []  []  []    Signs/Symptoms 0 -Absent/ trace  1 -Slight 2 -Moderate 3 -Marked  Oedema []  []  [x]  []

## 2020-09-03 NOTE — Progress Notes (Signed)
ReDS Vest / Clip - 09/03/20 1000      ReDS Vest / Clip   Station Marker A    Ruler Value 30    ReDS Value Range High volume overload    ReDS Actual Value 42

## 2020-09-03 NOTE — Patient Instructions (Signed)
Return for follow up 09/04/2020  IV/SQ Lasix 09/03/20 and 09/04/20 On Saturday 09/05/20 restart Torsemide 100 mg daily Take 40 meq of potassium daily  Labs today We will only contact you if something comes back abnormal or we need to make some changes. Otherwise no news is good news!  Do the following things EVERYDAY: 1) Weigh yourself in the morning before breakfast. Write it down and keep it in a log. 2) Take your medicines as prescribed 3) Eat low salt foods--Limit salt (sodium) to 2000 mg per day.  4) Stay as active as you can everyday 5) Limit all fluids for the day to less than 2 liters  At the [facility], you and your health needs are our priority. As part of our continuing mission to provide you with exceptional heart care, we have created designated Provider Care Teams. These Care Teams include your primary Cardiologist (physician) and Advanced Practice Providers (APPs- Physician Assistants and Nurse Practitioners) who all work together to provide you with the care you need, when you need it.   You may see any of the following providers on your designated Care Team at your next follow up: Marland Kitchen Dr Glori Bickers . Dr Loralie Champagne . Darrick Grinder, NP . Lyda Jester, [place] . [name], PharmD   Please be sure to bring in all your medications bottles to every appointment.

## 2020-09-03 NOTE — Patient Instructions (Signed)
Visit Information  [name]  - as a part of your Medicaid benefit, you are eligible for care management and care coordination services at no cost or copay. I was unable to reach you by phone today but would be happy to help you with your health related needs. Please feel free to call me at [phone].   Aida Raider RN, BSN [place]  [facility] [employer] - Managed Medicaid High Risk [phone].

## 2020-09-04 ENCOUNTER — Encounter (HOSPITAL_COMMUNITY): Payer: Self-pay | Admitting: Emergency Medicine

## 2020-09-04 ENCOUNTER — Other Ambulatory Visit: Payer: Self-pay

## 2020-09-04 ENCOUNTER — Ambulatory Visit (HOSPITAL_BASED_OUTPATIENT_CLINIC_OR_DEPARTMENT_OTHER)
Admission: RE | Admit: 2020-09-04 | Discharge: 2020-09-04 | Disposition: A | Discharge status: Home / Self Care | Payer: Medicaid Other | Source: Ambulatory Visit | Attending: Family Medicine | Admitting: Family Medicine

## 2020-09-04 ENCOUNTER — Inpatient Hospital Stay (HOSPITAL_COMMUNITY)
Admission: EM | Admit: 2020-09-04 | Discharge: 2020-09-05 | DRG: 638 | Disposition: A | Discharge status: Home / Self Care | Payer: Medicaid Other | Source: Ambulatory Visit | Attending: Internal Medicine | Admitting: Internal Medicine

## 2020-09-04 VITALS — BP 131/86 | HR 99 | Wt 189.4 lb

## 2020-09-04 DIAGNOSIS — I509 Heart failure, unspecified: Secondary | ICD-10-CM

## 2020-09-04 DIAGNOSIS — I110 Hypertensive heart disease with heart failure: Secondary | ICD-10-CM | POA: Diagnosis present

## 2020-09-04 DIAGNOSIS — N179 Acute kidney failure, unspecified: Secondary | ICD-10-CM | POA: Diagnosis not present

## 2020-09-04 DIAGNOSIS — I480 Paroxysmal atrial fibrillation: Secondary | ICD-10-CM | POA: Insufficient documentation

## 2020-09-04 DIAGNOSIS — J45909 Unspecified asthma, uncomplicated: Secondary | ICD-10-CM | POA: Diagnosis not present

## 2020-09-04 DIAGNOSIS — Z794 Long term (current) use of insulin: Secondary | ICD-10-CM

## 2020-09-04 DIAGNOSIS — E0821 Diabetes mellitus due to underlying condition with diabetic nephropathy: Secondary | ICD-10-CM

## 2020-09-04 DIAGNOSIS — G4733 Obstructive sleep apnea (adult) (pediatric): Secondary | ICD-10-CM | POA: Diagnosis present

## 2020-09-04 DIAGNOSIS — N898 Other specified noninflammatory disorders of vagina: Secondary | ICD-10-CM | POA: Insufficient documentation

## 2020-09-04 DIAGNOSIS — Z86718 Personal history of other venous thrombosis and embolism: Secondary | ICD-10-CM | POA: Insufficient documentation

## 2020-09-04 DIAGNOSIS — I428 Other cardiomyopathies: Secondary | ICD-10-CM | POA: Insufficient documentation

## 2020-09-04 DIAGNOSIS — E876 Hypokalemia: Secondary | ICD-10-CM | POA: Diagnosis not present

## 2020-09-04 DIAGNOSIS — R531 Weakness: Secondary | ICD-10-CM | POA: Insufficient documentation

## 2020-09-04 DIAGNOSIS — Z91013 Allergy to seafood: Secondary | ICD-10-CM

## 2020-09-04 DIAGNOSIS — Z006 Encounter for examination for normal comparison and control in clinical research program: Secondary | ICD-10-CM

## 2020-09-04 DIAGNOSIS — I5020 Unspecified systolic (congestive) heart failure: Secondary | ICD-10-CM | POA: Diagnosis not present

## 2020-09-04 DIAGNOSIS — R0600 Dyspnea, unspecified: Secondary | ICD-10-CM

## 2020-09-04 DIAGNOSIS — D72829 Elevated white blood cell count, unspecified: Secondary | ICD-10-CM | POA: Diagnosis not present

## 2020-09-04 DIAGNOSIS — Z79899 Other long term (current) drug therapy: Secondary | ICD-10-CM | POA: Insufficient documentation

## 2020-09-04 DIAGNOSIS — E1010 Type 1 diabetes mellitus with ketoacidosis without coma: Secondary | ICD-10-CM | POA: Diagnosis not present

## 2020-09-04 DIAGNOSIS — Z7901 Long term (current) use of anticoagulants: Secondary | ICD-10-CM | POA: Insufficient documentation

## 2020-09-04 DIAGNOSIS — Z20822 Contact with and (suspected) exposure to covid-19: Secondary | ICD-10-CM | POA: Diagnosis present

## 2020-09-04 DIAGNOSIS — E282 Polycystic ovarian syndrome: Secondary | ICD-10-CM | POA: Diagnosis present

## 2020-09-04 DIAGNOSIS — Z888 Allergy status to other drugs, medicaments and biological substances status: Secondary | ICD-10-CM | POA: Diagnosis not present

## 2020-09-04 DIAGNOSIS — R0602 Shortness of breath: Secondary | ICD-10-CM | POA: Insufficient documentation

## 2020-09-04 DIAGNOSIS — E118 Type 2 diabetes mellitus with unspecified complications: Secondary | ICD-10-CM | POA: Insufficient documentation

## 2020-09-04 DIAGNOSIS — I5022 Chronic systolic (congestive) heart failure: Secondary | ICD-10-CM | POA: Insufficient documentation

## 2020-09-04 DIAGNOSIS — E860 Dehydration: Secondary | ICD-10-CM | POA: Diagnosis not present

## 2020-09-04 DIAGNOSIS — R000 Tachycardia, unspecified: Secondary | ICD-10-CM

## 2020-09-04 DIAGNOSIS — E1110 Type 2 diabetes mellitus with ketoacidosis without coma: Secondary | ICD-10-CM | POA: Diagnosis not present

## 2020-09-04 DIAGNOSIS — Z7984 Long term (current) use of oral hypoglycemic drugs: Secondary | ICD-10-CM

## 2020-09-04 DIAGNOSIS — I5023 Acute on chronic systolic (congestive) heart failure: Secondary | ICD-10-CM

## 2020-09-04 DIAGNOSIS — E0810 Diabetes mellitus due to underlying condition with ketoacidosis without coma: Secondary | ICD-10-CM

## 2020-09-04 DIAGNOSIS — D75839 Thrombocytosis, unspecified: Secondary | ICD-10-CM | POA: Diagnosis present

## 2020-09-04 DIAGNOSIS — Z8249 Family history of ischemic heart disease and other diseases of the circulatory system: Secondary | ICD-10-CM | POA: Insufficient documentation

## 2020-09-04 DIAGNOSIS — D500 Iron deficiency anemia secondary to blood loss (chronic): Secondary | ICD-10-CM | POA: Diagnosis present

## 2020-09-04 DIAGNOSIS — I5042 Chronic combined systolic (congestive) and diastolic (congestive) heart failure: Secondary | ICD-10-CM | POA: Diagnosis not present

## 2020-09-04 DIAGNOSIS — F32A Depression, unspecified: Secondary | ICD-10-CM | POA: Diagnosis present

## 2020-09-04 DIAGNOSIS — Z833 Family history of diabetes mellitus: Secondary | ICD-10-CM

## 2020-09-04 DIAGNOSIS — F0630 Mood disorder due to known physiological condition, unspecified: Secondary | ICD-10-CM | POA: Diagnosis present

## 2020-09-04 DIAGNOSIS — I2510 Atherosclerotic heart disease of native coronary artery without angina pectoris: Secondary | ICD-10-CM | POA: Insufficient documentation

## 2020-09-04 DIAGNOSIS — F419 Anxiety disorder, unspecified: Secondary | ICD-10-CM

## 2020-09-04 DIAGNOSIS — R0609 Other forms of dyspnea: Secondary | ICD-10-CM

## 2020-09-04 LAB — BASIC METABOLIC PANEL
Anion gap: 17 — ABNORMAL HIGH (ref 5–15)
Anion gap: 18 — ABNORMAL HIGH (ref 5–15)
Anion gap: 11 (ref 5–15)
BUN: 11 mg/dL (ref 6–20)
BUN: 11 mg/dL (ref 6–20)
BUN: 7 mg/dL (ref 6–20)
CO2: 22 mmol/L (ref 22–32)
CO2: 22 mmol/L (ref 22–32)
CO2: 26 mmol/L (ref 22–32)
Calcium: 9 mg/dL (ref 8.9–10.3)
Calcium: 8.9 mg/dL (ref 8.9–10.3)
Calcium: 8.6 mg/dL — ABNORMAL LOW (ref 8.9–10.3)
Chloride: 88 mmol/L — ABNORMAL LOW (ref 98–111)
Chloride: 87 mmol/L — ABNORMAL LOW (ref 98–111)
Chloride: 96 mmol/L — ABNORMAL LOW (ref 98–111)
Creatinine, Ser: 1.24 mg/dL — ABNORMAL HIGH (ref 0.44–1.00)
Creatinine, Ser: 1.2 mg/dL — ABNORMAL HIGH (ref 0.44–1.00)
Creatinine, Ser: 0.79 mg/dL (ref 0.44–1.00)
GFR, Estimated: 56 mL/min — ABNORMAL LOW (ref >60)
GFR, Estimated: 58 mL/min — ABNORMAL LOW (ref >60)
GFR, Estimated: >60 mL/min (ref >60)
Glucose, Bld: 734 mg/dL (ref 70–99)
Glucose, Bld: 676 mg/dL (ref 70–99)
Glucose, Bld: 233 mg/dL — ABNORMAL HIGH (ref 70–99)
Potassium: 3.5 mmol/L (ref 3.5–5.1)
Potassium: 3.7 mmol/L (ref 3.5–5.1)
Potassium: 3.2 mmol/L — ABNORMAL LOW (ref 3.5–5.1)
Sodium: 127 mmol/L — ABNORMAL LOW (ref 135–145)
Sodium: 127 mmol/L — ABNORMAL LOW (ref 135–145)
Sodium: 133 mmol/L — ABNORMAL LOW (ref 135–145)

## 2020-09-04 LAB — I-STAT BETA HCG BLOOD, ED (MC, WL, AP ONLY)
I-stat hCG, quantitative: <5 m[IU]/mL (ref <5)
I-stat hCG, quantitative: <5 m[IU]/mL (ref <5)

## 2020-09-04 LAB — CBC
HCT: 43.1 % (ref 36.0–46.0)
HCT: 36.3 % (ref 36.0–46.0)
Hemoglobin: 13.7 g/dL (ref 12.0–15.0)
Hemoglobin: 11.4 g/dL — ABNORMAL LOW (ref 12.0–15.0)
MCH: 24.6 pg — ABNORMAL LOW (ref 26.0–34.0)
MCH: 24.1 pg — ABNORMAL LOW (ref 26.0–34.0)
MCHC: 31.8 g/dL (ref 30.0–36.0)
MCHC: 31.4 g/dL (ref 30.0–36.0)
MCV: 77.2 fL — ABNORMAL LOW (ref 80.0–100.0)
MCV: 76.6 fL — ABNORMAL LOW (ref 80.0–100.0)
Platelets: 431 10*3/uL — ABNORMAL HIGH (ref 150–400)
Platelets: 342 10*3/uL (ref 150–400)
RBC: 5.58 MIL/uL — ABNORMAL HIGH (ref 3.87–5.11)
RBC: 4.74 MIL/uL (ref 3.87–5.11)
RDW: 16.8 % — ABNORMAL HIGH (ref 11.5–15.5)
RDW: 16 % — ABNORMAL HIGH (ref 11.5–15.5)
WBC: 16.4 10*3/uL — ABNORMAL HIGH (ref 4.0–10.5)
WBC: 14 10*3/uL — ABNORMAL HIGH (ref 4.0–10.5)
nRBC: 0 % (ref 0.0–0.2)
nRBC: 0 % (ref 0.0–0.2)

## 2020-09-04 LAB — CBG MONITORING, ED
Glucose-Capillary: >600 mg/dL (ref 70–99)
Glucose-Capillary: 316 mg/dL — ABNORMAL HIGH (ref 70–99)
Glucose-Capillary: 210 mg/dL — ABNORMAL HIGH (ref 70–99)
Glucose-Capillary: 201 mg/dL — ABNORMAL HIGH (ref 70–99)
Glucose-Capillary: 188 mg/dL — ABNORMAL HIGH (ref 70–99)

## 2020-09-04 LAB — URINALYSIS, ROUTINE W REFLEX MICROSCOPIC
Bilirubin Urine: NEGATIVE
Glucose, UA: >=500 mg/dL — AB
Ketones, ur: NEGATIVE mg/dL
Nitrite: NEGATIVE
Protein, ur: NEGATIVE mg/dL
Specific Gravity, Urine: 1.022 (ref 1.005–1.030)
pH: 6 (ref 5.0–8.0)

## 2020-09-04 LAB — I-STAT VENOUS BLOOD GAS, ED
Acid-Base Excess: 5 mmol/L — ABNORMAL HIGH (ref 0.0–2.0)
Bicarbonate: 29.8 mmol/L — ABNORMAL HIGH (ref 20.0–28.0)
Calcium, Ion: 1.07 mmol/L — ABNORMAL LOW (ref 1.15–1.40)
HCT: 47 % — ABNORMAL HIGH (ref 36.0–46.0)
Hemoglobin: 16 g/dL — ABNORMAL HIGH (ref 12.0–15.0)
O2 Saturation: 99 %
Potassium: 3.6 mmol/L (ref 3.5–5.1)
Sodium: 131 mmol/L — ABNORMAL LOW (ref 135–145)
TCO2: 31 mmol/L (ref 22–32)
pCO2, Ven: 42.3 mmHg — ABNORMAL LOW (ref 44.0–60.0)
pH, Ven: 7.457 — ABNORMAL HIGH (ref 7.250–7.430)
pO2, Ven: 112 mmHg — ABNORMAL HIGH (ref 32.0–45.0)

## 2020-09-04 LAB — RESP PANEL BY RT-PCR (FLU A&B, COVID) ARPGX2
Influenza A by PCR: NEGATIVE
Influenza B by PCR: NEGATIVE
SARS Coronavirus 2 by RT PCR: NEGATIVE

## 2020-09-04 LAB — HIV ANTIBODY (ROUTINE TESTING W REFLEX): HIV Screen 4th Generation wRfx: NONREACTIVE

## 2020-09-04 LAB — BETA-HYDROXYBUTYRIC ACID
Beta-Hydroxybutyric Acid: 0.13 mmol/L (ref 0.05–0.27)
Beta-Hydroxybutyric Acid: 0.08 mmol/L (ref 0.05–0.27)

## 2020-09-04 LAB — HEMOGLOBIN A1C
Hgb A1c MFr Bld: 13.6 % — ABNORMAL HIGH (ref 4.8–5.6)
Mean Plasma Glucose: 343.62 mg/dL

## 2020-09-04 LAB — MAGNESIUM: Magnesium: 1.9 mg/dL (ref 1.7–2.4)

## 2020-09-04 MED ORDER — DEXTROSE 50 % IV SOLN: 0.0000 mL | INTRAVENOUS | Status: DC | PRN

## 2020-09-04 MED ORDER — DEXTROSE IN LACTATED RINGERS 5 % IV SOLN: INTRAVENOUS | Status: DC

## 2020-09-04 MED ORDER — LACTATED RINGERS IV SOLN: INTRAVENOUS | Status: DC

## 2020-09-04 MED ORDER — LACTATED RINGERS IV BOLUS
20.0000 mL/kg | Freq: Once | INTRAVENOUS | Status: AC
  Administered 2020-09-04: 1718 mL via INTRAVENOUS

## 2020-09-04 MED ORDER — POTASSIUM CHLORIDE 10 MEQ/100ML IV SOLN
10.0000 meq | INTRAVENOUS | Status: AC
  Administered 2020-09-04 (×2): 10 meq via INTRAVENOUS
  Filled 2020-09-04 (×2): qty 100

## 2020-09-04 MED ORDER — ENOXAPARIN SODIUM 40 MG/0.4ML ~~LOC~~ SOLN
40.0000 mg | SUBCUTANEOUS | Status: DC
  Administered 2020-09-05: 40 mg via SUBCUTANEOUS
  Filled 2020-09-04: qty 0.4

## 2020-09-04 MED ORDER — INSULIN REGULAR(HUMAN) IN NACL 100-0.9 UT/100ML-% IV SOLN
INTRAVENOUS | Status: DC
  Administered 2020-09-04: 10 [IU]/h via INTRAVENOUS
  Filled 2020-09-04: qty 100

## 2020-09-04 MED ORDER — LACTATED RINGERS IV BOLUS: 20.0000 mL/kg | Freq: Once | INTRAVENOUS | Status: DC

## 2020-09-04 MED ORDER — INSULIN REGULAR(HUMAN) IN NACL 100-0.9 UT/100ML-% IV SOLN
INTRAVENOUS | Status: DC
  Administered 2020-09-04: 20:00:00 3 [IU]/h via INTRAVENOUS

## 2020-09-04 MED ORDER — INSULIN ASPART 100 UNIT/ML ~~LOC~~ SOLN
20.0000 [IU] | Freq: Once | SUBCUTANEOUS | Status: AC
  Administered 2020-09-04: 20 [IU] via SUBCUTANEOUS
  Filled 2020-09-04: qty 0.2

## 2020-09-04 MED ORDER — POTASSIUM CHLORIDE 10 MEQ/100ML IV SOLN
10.0000 meq | INTRAVENOUS | Status: AC
  Administered 2020-09-04 (×2): 10 meq via INTRAVENOUS

## 2020-09-04 NOTE — Research (Addendum)
  [employer] Protocol [id number] AT [facility] Pilot  Patient Global Assessment Visual Analog Scale (EQ-VAS)        Subject Number:   16-008  Date Completed:        09/04/2020  Study Day:  Day 1       . We would like to know how good or bad your health is TODAY. Marland Kitchen This scale is numbered 0 to 100. . 100 means the best health you can imagine.       0 means the worst health you can imagine. . Please let me know the number you feel indicated how your health is TODAY.    VAS SCORE - YOUR HEALTH TODAY : 50       Avoiding Treatment in the Hospital with Furoscix for the Management of Congestion in Heart Failure - A Pilot Study  AT HOME-HF Pilot   Subject Number:   16-008  Date Completed:        09/04/2020  Study Day:  Day 1     Vital Signs: Respiration Rate:  (bpm) 20 ReDS:  (%) 31

## 2020-09-04 NOTE — Research (Signed)
Subject returned for At Home Day 1 in-clinic visit. HR was 130. Weight down over 5 kg after 1 furosix infusor yesterday. No clinical signs of volume overload. NP instructed study staff to remove infusor that was actively on the subject. The infusor ([id number])  was started at 0937 on 09/04/2020 by the patient and removed by study staff at 1052 on 09/04/2020 per NP instruction. There were no complications with the device.

## 2020-09-04 NOTE — ED Provider Notes (Signed)
Patient is a 45 year old female whose care was transferred to me at shift change from [facility].  Has a history of diabetes mellitus.  Takes 22 units of Lantus every evening and does not regularly check her blood sugar.  She also has a history of heart failure was at the heart failure clinic earlier today and was noted to be significantly hyperglycemic and was then sent to the emergency department.  She states since last night she has been experiencing significant nausea without vomiting as well as fatigue.  No history of DKA, per patient.  She is not vaccinated for COVID-19.  No fevers, chills, URI symptoms, chest pain, shortness of breath, abdominal pain. Physical Exam  BP (!) 110/92   Pulse (!) 126   Temp 98.9 F (37.2 C)   Resp 18   SpO2 98%   Physical Exam Vitals and nursing note reviewed.  Constitutional:      General: She is not in acute distress.    Appearance: Normal appearance. She is not ill-appearing, toxic-appearing or diaphoretic.  HENT:     Head: Normocephalic and atraumatic.     Right Ear: External ear normal.     Left Ear: External ear normal.     Nose: Nose normal.     Mouth/Throat:     Mouth: Mucous membranes are dry.     Pharynx: Oropharynx is clear. No oropharyngeal exudate or posterior oropharyngeal erythema.  Eyes:     General: No scleral icterus.       Right eye: No discharge.        Left eye: No discharge.     Extraocular Movements: Extraocular movements intact.  Cardiovascular:     Rate and Rhythm: Regular rhythm. Tachycardia present.     Pulses: Normal pulses.     Heart sounds: Normal heart sounds. No murmur heard. No friction rub. No gallop.   Pulmonary:     Effort: Pulmonary effort is normal. No respiratory distress.     Breath sounds: Normal breath sounds. No stridor. No wheezing, rhonchi or rales.  Abdominal:     General: Abdomen is flat.     Palpations: Abdomen is soft.     Tenderness: There is no abdominal tenderness.     Comments: Protuberant  abdomen that is soft and nontender.  Musculoskeletal:        General: Normal range of motion.     Cervical back: Normal range of motion and neck supple. No tenderness.  Skin:    General: Skin is warm and dry.  Neurological:     General: No focal deficit present.     Mental Status: She is alert and oriented to person, place, and time.  Psychiatric:        Mood and Affect: Mood normal.        Behavior: Behavior normal.    ED Course/Procedures   Clinical Course as of 09/04/20 1728  Fri Sep 04, 2020  1500 Sodium(!): 127 Corrected sodium of 136 [LJ]  1503 Creatinine(!): 1.20 [LJ]  1503 GFR, Estimated(!): 58 [LJ]  1503 Anion gap(!): 18 [LJ]    Clinical Course User Index [LJ] Rayna Sexton, PA-C   .Critical Care Performed by: Rayna Sexton, PA-C Authorized by: Rayna Sexton, PA-C   Critical care provider statement:    Critical care time (minutes):  30   Critical care was necessary to treat or prevent imminent or life-threatening deterioration of the following conditions: DKA.   Critical care was time spent personally by me on the following activities:  Discussions with consultants, evaluation of patient's response to treatment, examination of patient, ordering and performing treatments and interventions, ordering and review of laboratory studies, ordering and review of radiographic studies, pulse oximetry, re-evaluation of patient's condition, obtaining history from patient or surrogate and review of old charts   MDM  Pt is a 45 y.o. female who presents the emergency department hyperglycemic in what appears to be DKA.  Labs: CBC with leukocytosis of 16.4, thrombocytosis of 431, decreased MCV at 77.2, elevated RDW at 16.8. BMP showing glucose of 676.  Corrected sodium of 136.  Creatinine of 1.20 with a GFR of 58.  Elevated anion gap at 18.  I, Rayna Sexton, PA-C, personally reviewed and evaluated these images and lab results as part of my medical  decision-making.  Patient started on IV fluids, given IV potassium, as well as started on Endo tool. Will discuss with the medicine team for admission for DKA.  Note: Portions of this report may have been transcribed using voice recognition software. Every effort was made to ensure accuracy; however, inadvertent computerized transcription errors may be present.        Rayna Sexton, PA-C 09/04/20 1800    Dorie Rank, MD 09/04/20 [phone]

## 2020-09-04 NOTE — Medical Student Note (Signed)
[facility] DEPT Provider Student Note For educational purposes for Medical, PA and NP students only and not part of the legal medical record.   CSN: [id number] Arrival date & time: 09/04/20  1244      History   Chief Complaint Chief Complaint  Patient presents with  . Hyperglycemia    HPI JAMYAH FOLK is a 45 y.o. female with PMH of a fib, T2DM, DKA, HFrEF who presents to ED for hyperglycemia.   States she went to heart failure clinic today, drew labs and found blood glucose to be elevated so she was sent to ED. Initial BG 734 on arrival. She states she started feeling unwell last night into this morning. States she has been nauseated without vomiting, fatigue and increased thirst. Tachycardic on arrival and endorses palpitations. States she has chest pressure that has been intermittent since yesterday. Non-radiating. No associated symptoms. Denies abdominal pain, diarrhea, headache or loss of appetite. Endorses polyuria but takes multiple diuretics.   She takes 22 units of lantus nightly. Does not take meal time insulin or oral anti-diabetics. Denies missing any doses of insulin. States that she was given unknown dose of lantus at HF clinic today.   Denies her glucose ever being this high. Does not have home BG monitoring.   Past Medical History:  Diagnosis Date  . Abnormal Pap smear   . Acute on chronic combined systolic and diastolic CHF (congestive heart failure) ([place]) 02/19/2019  . Acute on chronic congestive heart failure ([place])   . Anemia   . Asthma   . Atrial fibrillation ([place])   . Diabetes mellitus   . DKA, type 2 ([place]) 02/06/2019  . Heart failure with reduced ejection fraction ([place])   . Hypertension   . 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    . Migraines   . Morbid obesity ([place]) 02/06/2019  . Obstructive sleep apnea 06/30/2015  . Polycystic ovarian  disease     Patient Active Problem List   Diagnosis Date Noted  . Hypokalemia 10/13/2019  . Hyponatremia 10/13/2019  . Encounter for therapeutic drug monitoring 10/07/2019  . Acute on chronic congestive heart failure ([place])   . Hypertensive urgency 09/30/2019  . Elevated troponin 09/30/2019  . Hypertensive emergency 09/30/2019  . Attempting to conceive 03/10/2019  . Acute on chronic systolic CHF (congestive heart failure) ([place]) 02/19/2019  . Asthma   . Yeast infection 02/06/2019  . Right ventricular failure ([place]) 02/06/2019  . Morbid obesity ([place]) 02/06/2019  . HFrEF (heart failure with reduced ejection fraction) ([place]) 12/31/2018  . Paroxysmal atrial fibrillation ([place]) 12/31/2018  . Anxiety 12/31/2018  . Hyperlipidemia associated with type 2 diabetes mellitus ([place]) 10/30/2017  . Obstructive sleep apnea 06/30/2015  . Anemia due to blood loss, chronic 06/27/2012  . Allergic rhinitis 06/27/2012  . Major depressive disorder 02/08/2012  . Hypertension associated with diabetes ([place]) 12/04/2011  . Dysmenorrhea 11/22/2011  . Vaginal yeast infection 05/19/2011  . DM2 (diabetes mellitus, type 2) ([place]) 05/19/2011  . PCOS (polycystic ovarian syndrome) 05/19/2011  . Hydrosalpinx 08/21/2009    Past Surgical History:  Procedure Laterality Date  . CERVIX LESION DESTRUCTION    . CESAREAN SECTION    . RIGHT/LEFT HEART CATH AND CORONARY ANGIOGRAPHY N/A 03/29/2019   Procedure: RIGHT/LEFT HEART CATH AND CORONARY ANGIOGRAPHY;  Surgeon: Larey Dresser, MD;  Location: [facility];  Service: Cardiovascular;  Laterality: N/A;    OB History    Gravida  2   Para  2   Term  2   Preterm      AB      Living  2     SAB      IAB      Ectopic      Multiple      Live Births  2          Home Medications    Prior to Admission medications   Medication Sig Start Date End Date Taking? Authorizing Provider  Accu-Chek Softclix Lancets lancets Use as instructed 02/12/19   Caroline More, DO  albuterol (VENTOLIN HFA) 108 (90 Base) MCG/ACT inhaler Inhale 2 puffs into the lungs every 6 (six) hours as needed for wheezing or shortness of breath. 06/16/20   Lyda Jester M, PA-C  atorvastatin (LIPITOR) 40 MG tablet TAKE 1 TABLET BY MOUTH DAILY AT 6 PM. 04/01/20   Larey Dresser, MD  Blood Glucose Monitoring Suppl (ACCU-CHEK AVIVA PLUS) w/Device KIT 1 application by Does not apply route QID. Check fasting sugars as well as throughout the day 02/12/19   Caroline More, DO  busPIRone (BUSPAR) 7.5 MG tablet Take 7.5 mg by mouth 2 (two) times daily. 06/03/20   [provider]  carvedilol (COREG) 25 MG tablet Take 1 tablet (25 mg total) by mouth 2 (two) times daily with a meal. 01/23/20   Larey Dresser, MD  digoxin (LANOXIN) 0.125 MG tablet Take 1 tablet (0.125 mg total) by mouth daily. 11/15/19   Lyda Jester M, PA-C  empagliflozin (JARDIANCE) 25 MG TABS tablet Take 25 mg by mouth daily. 10/03/19   Pokhrel, Corrie Mckusick, MD  furosemide (LASIX) 20 MG tablet Take 20 mg by mouth daily. 05/26/20   [provider]  gabapentin (NEURONTIN) 100 MG capsule Take 100 mg by mouth 2 (two) times daily.    [provider]  glucose blood (ACCU-CHEK AVIVA PLUS) test strip Check CBG 2 times per day 08/16/19   [name], DO  glucose blood (AGAMATRIX PRESTO TEST) test strip Use as instructed 10/23/17   Zenia Resides, MD  hydrOXYzine (ATARAX/VISTARIL) 10 MG tablet TAKE 1 TABLET (10 MG TOTAL) BY MOUTH 3 (THREE) TIMES DAILY AS NEEDED. 07/08/20   Gifford Shave, MD  insulin glargine (LANTUS) 100 UNIT/ML injection Inject 0.22 mLs (22 Units total) into the skin daily. 12/06/19   Bland, Scott, DO  isosorbide-hydrALAZINE (BIDIL) 20-37.5 MG tablet Take 0.5 tablets by mouth 3 (three) times daily. 04/03/20   Larey Dresser, MD  ivabradine (CORLANOR) 5 MG TABS tablet Take 1 tablet (5 mg total) by mouth 2 (two) times daily with a meal. 05/26/20   Larey Dresser, MD  Lancets Misc.  (ACCU-CHEK SOFTCLIX LANCET DEV) KIT 1 application by Does not apply route 2 (two) times daily. 08/16/19   [name], DO  meclizine (ANTIVERT) 12.5 MG tablet Take 1 tablet (12.5 mg total) by mouth 3 (three) times daily as needed for dizziness. 09/19/19   [name], DO  potassium chloride SA (KLOR-CON) 20 MEQ tablet Take 20 mEq by mouth See admin instructions. Take 1 tablet by mouth every Monday Wednesday and Friday    [provider]  sacubitril-valsartan (ENTRESTO) 97-103 MG Take 1 tablet by mouth 2 (two) times daily. 11/28/19   Larey Dresser, MD  sertraline (ZOLOFT) 25 MG tablet Take 1 tablet (25 mg total) by mouth daily. 05/26/20   Larey Dresser,  MD  spironolactone (ALDACTONE) 25 MG tablet Take 25 mg by mouth daily.    [provider]  torsemide (DEMADEX) 20 MG tablet Take 100 mg by mouth daily.    [provider]  traZODone (DESYREL) 50 MG tablet TAKE 1 TABLET (50 MG TOTAL) BY MOUTH AT BEDTIME AS NEEDED FOR SLEEP. 07/08/20   Gifford Shave, MD  warfarin (COUMADIN) 7.5 MG tablet Take as directed by Coumadin Clinic Patient taking differently: Take 15 mg by mouth daily. 06/24/20   Camnitz, Ocie Doyne, MD    Family History Family History  Problem Relation Age of Onset  . Diabetes Mother   . Hypertension Mother   . Hypertension Father   . Diabetes Father     Social History Social History   Tobacco Use  . Smoking status: Never Smoker  . Smokeless tobacco: Never Used  Vaping Use  . Vaping Use: Never used  Substance Use Topics  . Alcohol use: No    Alcohol/week: 0.0 standard drinks  . Drug use: No     Allergies   Shellfish allergy, Dulaglutide, Metformin and related, and Ibuprofen   Review of Systems Review of Systems  Constitutional: Positive for fatigue. Negative for appetite change and fever.  HENT: Negative.   Eyes: Negative.   Respiratory: Negative for chest tightness and shortness of breath.   Cardiovascular: Positive for chest pain.  Negative for leg swelling.  Gastrointestinal: Positive for nausea. Negative for abdominal pain, diarrhea and vomiting.  Endocrine: Positive for polyuria.  Genitourinary: Positive for frequency.  Musculoskeletal: Negative.   Skin: Negative.   Allergic/Immunologic: Negative.   Neurological: Negative for dizziness and light-headedness.  Hematological: Negative.   Psychiatric/Behavioral: Negative.      Physical Exam Updated Vital Signs BP 123/71   Pulse (!) 124   Temp 98.9 F (37.2 C)   Resp 17   SpO2 99%   Physical Exam Constitutional:      General: She is not in acute distress.    Appearance: Normal appearance.  HENT:     Head: Normocephalic.     Nose: Nose normal.     Mouth/Throat:     Mouth: Mucous membranes are moist.  Eyes:     Extraocular Movements: Extraocular movements intact.     Pupils: Pupils are equal, round, and reactive to light.  Cardiovascular:     Rate and Rhythm: Regular rhythm. Tachycardia present.     Pulses: Normal pulses.  Pulmonary:     Effort: Pulmonary effort is normal.  Abdominal:     General: There is no distension.     Palpations: Abdomen is soft.     Tenderness: There is no abdominal tenderness.  Musculoskeletal:        General: Normal range of motion.  Skin:    General: Skin is warm and dry.     Capillary Refill: Capillary refill takes less than 2 seconds.  Neurological:     General: No focal deficit present.     Mental Status: She is alert.  Psychiatric:        Mood and Affect: Mood normal.    ED Treatments / Results  Labs (all labs ordered are listed, but only abnormal results are displayed) Labs Reviewed  BASIC METABOLIC PANEL - Abnormal; Notable for the following components:      Result Value   Sodium 127 (*)    Chloride 87 (*)    Glucose, Bld 676 (*)    Creatinine, Ser 1.20 (*)    GFR, Estimated 58 (*)  Anion gap 18 (*)    All other components within normal limits  CBC - Abnormal; Notable for the following  components:   WBC 16.4 (*)    RBC 5.58 (*)    MCV 77.2 (*)    MCH 24.6 (*)    RDW 16.8 (*)    Platelets 431 (*)    All other components within normal limits  CBG MONITORING, ED - Abnormal; Notable for the following components:   Glucose-Capillary >600 (*)    All other components within normal limits  URINALYSIS, ROUTINE W REFLEX MICROSCOPIC  BETA-HYDROXYBUTYRIC ACID  BETA-HYDROXYBUTYRIC ACID  CBG MONITORING, ED  I-STAT BETA HCG BLOOD, ED ([facility], [facility], AP ONLY)  I-STAT BETA HCG BLOOD, ED ([facility], [facility], [facility] ONLY)  CBG MONITORING, ED  I-STAT VENOUS BLOOD GAS, ED    EKG  Radiology No results found.  Procedures Procedures (including critical care time)  Medications Ordered in ED Medications  lactated ringers bolus 1,718 mL (has no administration in time range)  insulin regular, human (MYXREDLIN) 100 units/ 100 mL infusion (has no administration in time range)  lactated ringers infusion (has no administration in time range)  dextrose 5 % in lactated ringers infusion (has no administration in time range)  dextrose 50 % solution 0-50 mL (has no administration in time range)  potassium chloride 10 mEq in 100 mL IVPB (has no administration in time range)   Initial Impression / Assessment and Plan / ED Course  I have reviewed the triage vital signs and the nursing notes.  Pertinent labs & imaging results that were available during my care of the patient were reviewed by me and considered in my medical decision making (see chart for details).  Clinical Course as of 09/04/20 1503  Fri Sep 04, 2020  1500 Sodium(!): 127 Corrected sodium of 136 [LJ]  1503 Creatinine(!): 1.20 [LJ]  1503 GFR, Estimated(!): 58 [LJ]  1503 Anion gap(!): 18 [LJ]    Clinical Course User Index [LJ] Rayna Sexton, PA-C   Mahoganie Basher is a 63yoF with PMH listed above who presents for hyperglycemia. Differential includes DKA vs. HHS. Likely DKA  Labs demonstrate DKA with BG 734, AG 18. WBC 16 likely  reactive. Sodium 127 --> corrected to 136.   Will start on insulin gtt and IVF.  Keep NPO at this time.   Final Clinical Impressions(s) / ED Diagnoses   Final diagnoses:  None    New Prescriptions New Prescriptions   No medications on file

## 2020-09-04 NOTE — H&P (Signed)
History and Physical   [name] CBS:[id number] DOB: Nov 19, 1975 DOA: 09/04/2020  Referring MD/NP/PA: Dr. Dorie Rank  PCP: Gifford Shave, MD   Outpatient Specialists: None  Patient coming from: Home  Chief Complaint: Hyperglycemia  HPI: KOI YARBRO is a 45 y.o. female with medical history significant of type 1 diabetes, history of chronic combined systolic and diastolic CHF, A. fib, asthma morbid obesity, obstructive sleep apnea and polycystic ovarian disease also known hypertension depressive disorder.  Patient went today heart failure clinic today.  As part of her work-up she was noted to be hyperglycemic with blood sugar in the 600s.  She was also noted to be weak.  She was tachypneic.  Patient sent over to the ER for evaluation and treatment.  In the ER patient was found to have anion gap of 18 and significant blood sugar again more than 600.  Patient denied ever having DKA's.  She apparently takes 22 units of Lantus every evening.  She has not been checking her blood sugar frequently.  She denied any abdominal pain nausea or vomiting.  Denied any missed doses of her insulin.  Patient seen and evaluated and being admitted for DKA..  ED Course: Temperature is 98.9 blood pressure 154/97 pulse 142 respirate of 34 oxygen sat 94% on room air.  White count is 14.0 hemoglobin 11.4 platelets 342.  Sodium 133 potassium 3.2 chloride 96.  Calcium is 8.6 with CO2 26.  Patient being admitted for DKA treatment  Review of Systems: As per HPI otherwise 10 point review of systems negative.    Past Medical History:  Diagnosis Date  . Abnormal Pap smear   . Acute on chronic combined systolic and diastolic CHF (congestive heart failure) ([place]) 02/19/2019  . Acute on chronic congestive heart failure ([place])   . Anemia   . Asthma   . Atrial fibrillation ([place])   . Diabetes mellitus   . DKA, type 2 ([place]) 02/06/2019  . Heart failure with reduced ejection fraction ([place])   . Hypertension   . Major  depressive disorder 02/08/2012   Reports that 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    . Migraines   . Morbid obesity ([place]) 02/06/2019  . Obstructive sleep apnea 06/30/2015  . Polycystic ovarian disease     Past Surgical History:  Procedure Laterality Date  . CERVIX LESION DESTRUCTION    . CESAREAN SECTION    . RIGHT/LEFT HEART CATH AND CORONARY ANGIOGRAPHY N/A 03/29/2019   Procedure: RIGHT/LEFT HEART CATH AND CORONARY ANGIOGRAPHY;  Surgeon: Larey Dresser, MD;  Location: [facility];  Service: Cardiovascular;  Laterality: N/A;     reports that she has never smoked. She has never used smokeless tobacco. She reports that she does not drink alcohol and does not use drugs.  Allergies  Allergen Reactions  . Shellfish Allergy Swelling    Airway involvement including EMS - Has Epi-Pen  . Dulaglutide Nausea And Vomiting and Other (See Comments)    Multiple episodes of vomiting over multiple days  . Metformin And Related Diarrhea  . Ibuprofen     Per PCP interferes with daily meds     Family History  Problem Relation Age of Onset  . Diabetes Mother   . Hypertension Mother   . Hypertension Father   . Diabetes Father      Prior to Admission medications   Medication Sig Start Date End Date Taking? Authorizing Provider  Accu-Chek Softclix Lancets lancets Use as instructed 02/12/19   Caroline More, DO  albuterol (VENTOLIN HFA) 108 (90 Base) MCG/ACT inhaler Inhale 2 puffs into the lungs every 6 (six) hours as needed for wheezing or shortness of breath. 06/16/20   Lyda Jester M, PA-C  atorvastatin (LIPITOR) 40 MG tablet TAKE 1 TABLET BY MOUTH DAILY AT 6 PM. 04/01/20   Larey Dresser, MD  Blood Glucose Monitoring Suppl (ACCU-CHEK AVIVA PLUS) w/Device KIT 1 application by Does not apply route QID. Check fasting sugars as well as throughout the day 02/12/19   Caroline More, DO  busPIRone (BUSPAR) 7.5 MG tablet Take 7.5  mg by mouth 2 (two) times daily. 06/03/20   [provider]  carvedilol (COREG) 25 MG tablet Take 1 tablet (25 mg total) by mouth 2 (two) times daily with a meal. 01/23/20   Larey Dresser, MD  digoxin (LANOXIN) 0.125 MG tablet Take 1 tablet (0.125 mg total) by mouth daily. 11/15/19   Lyda Jester M, PA-C  empagliflozin (JARDIANCE) 25 MG TABS tablet Take 25 mg by mouth daily. 10/03/19   Pokhrel, Corrie Mckusick, MD  furosemide (LASIX) 20 MG tablet Take 20 mg by mouth daily. 05/26/20   [provider]  gabapentin (NEURONTIN) 100 MG capsule Take 100 mg by mouth 2 (two) times daily.    [provider]  glucose blood (ACCU-CHEK AVIVA PLUS) test strip Check CBG 2 times per day 08/16/19   [name], DO  glucose blood (AGAMATRIX PRESTO TEST) test strip Use as instructed 10/23/17   Zenia Resides, MD  hydrOXYzine (ATARAX/VISTARIL) 10 MG tablet TAKE 1 TABLET (10 MG TOTAL) BY MOUTH 3 (THREE) TIMES DAILY AS NEEDED. 07/08/20   Gifford Shave, MD  insulin glargine (LANTUS) 100 UNIT/ML injection Inject 0.22 mLs (22 Units total) into the skin daily. 12/06/19   Bland, Scott, DO  isosorbide-hydrALAZINE (BIDIL) 20-37.5 MG tablet Take 0.5 tablets by mouth 3 (three) times daily. 04/03/20   Larey Dresser, MD  ivabradine (CORLANOR) 5 MG TABS tablet Take 1 tablet (5 mg total) by mouth 2 (two) times daily with a meal. 05/26/20   Larey Dresser, MD  Lancets Misc. (ACCU-CHEK SOFTCLIX LANCET DEV) KIT 1 application by Does not apply route 2 (two) times daily. 08/16/19   [name], DO  meclizine (ANTIVERT) 12.5 MG tablet Take 1 tablet (12.5 mg total) by mouth 3 (three) times daily as needed for dizziness. 09/19/19   [name], DO  potassium chloride SA (KLOR-CON) 20 MEQ tablet Take 20 mEq by mouth See admin instructions. Take 1 tablet by mouth every Monday Wednesday and Friday    [provider]  sacubitril-valsartan (ENTRESTO) 97-103 MG Take 1 tablet by mouth 2 (two) times daily. 11/28/19    Larey Dresser, MD  sertraline (ZOLOFT) 25 MG tablet Take 1 tablet (25 mg total) by mouth daily. 05/26/20   Larey Dresser, MD  spironolactone (ALDACTONE) 25 MG tablet Take 25 mg by mouth daily.    [provider]  torsemide (DEMADEX) 20 MG tablet Take 100 mg by mouth daily.    [provider]  traZODone (DESYREL) 50 MG tablet TAKE 1 TABLET (50 MG TOTAL) BY MOUTH AT BEDTIME AS NEEDED FOR SLEEP. 07/08/20   Gifford Shave, MD  warfarin (COUMADIN) 7.5 MG tablet Take as directed by [facility] Patient taking differently: Take 15 mg by mouth daily. 06/24/20   Constance Haw, MD    Physical Exam: Vitals:   09/04/20 1615 09/04/20  1700 09/04/20 1745 09/04/20 1930  BP: (!) 124/92 (!) 154/97 (!) 133/91 (!) 144/98  Pulse: (!) 123 (!) 142 91 87  Resp: (!) 35 (!) '21 20 15  ' Temp:      SpO2: 99% 100% 97% 98%      Constitutional: Morbidly obese, no distress Vitals:   09/04/20 1615 09/04/20 1700 09/04/20 1745 09/04/20 1930  BP: (!) 124/92 (!) 154/97 (!) 133/91 (!) 144/98  Pulse: (!) 123 (!) 142 91 87  Resp: (!) 35 (!) '21 20 15  ' Temp:      SpO2: 99% 100% 97% 98%   Eyes: PERRL, lids and conjunctivae normal ENMT: Mucous membranes are dry. Posterior pharynx clear of any exudate or lesions.Normal dentition.  Neck: normal, supple, no masses, no thyromegaly Respiratory: clear to auscultation bilaterally, no wheezing, no crackles. Normal respiratory effort. No accessory muscle use.  Cardiovascular: Sinus tachycardia, no murmurs / rubs / gallops. No extremity edema. 2+ pedal pulses. No carotid bruits.  Abdomen: no tenderness, no masses palpated. No hepatosplenomegaly. Bowel sounds positive.  Musculoskeletal: no clubbing / cyanosis. No joint deformity upper and lower extremities. Good ROM, no contractures. Normal muscle tone.  Skin: no rashes, lesions, ulcers. No induration Neurologic: CN 2-12 grossly intact. Sensation intact, DTR normal. Strength 5/5 in all 4.   Psychiatric: Normal judgment and insight. Alert and oriented x 3.  Anxious mood.     Labs on Admission: I have personally reviewed following labs and imaging studies  CBC: Recent Labs  Lab 09/03/20 1138 09/04/20 1331 09/04/20 1613  WBC  --  16.4*  --   HGB 15.3* 13.7 16.0*  HCT 45.0 43.1 47.0*  MCV  --  77.2*  --   PLT  --  431*  --    Basic Metabolic Panel: Recent Labs  Lab 09/03/20 1111 09/03/20 1138 09/04/20 1116 09/04/20 1331 09/04/20 1613  NA 133* 135 127* 127* 131*  K 3.8 3.8 3.5 3.7 3.6  CL 99 100 88* 87*  --   CO2 23  --  22 22  --   GLUCOSE 437* 428* 734* 676*  --   BUN 5* 5* 11 11  --   CREATININE 0.79 0.50 1.24* 1.20*  --   CALCIUM 9.0  --  9.0 8.9  --   MG 1.8  --  1.9  --   --    GFR: Estimated Creatinine Clearance: 58.1 mL/min (A) (by C-G formula based on SCr of 1.2 mg/dL (H)). Liver Function Tests: No results for input(s): AST, ALT, ALKPHOS, BILITOT, PROT, ALBUMIN in the last 168 hours. No results for input(s): LIPASE, AMYLASE in the last 168 hours. No results for input(s): AMMONIA in the last 168 hours. Coagulation Profile: Recent Labs  Lab 09/02/20 0948  INR 1.2*   Cardiac Enzymes: No results for input(s): CKTOTAL, CKMB, CKMBINDEX, TROPONINI in the last 168 hours. BNP (last 3 results) No results for input(s): PROBNP in the last 8760 hours. HbA1C: No results for input(s): HGBA1C in the last 72 hours. CBG: Recent Labs  Lab 09/04/20 1249 09/04/20 1902  GLUCAP >600* 316*   Lipid Profile: No results for input(s): CHOL, HDL, LDLCALC, TRIG, CHOLHDL, LDLDIRECT in the last 72 hours. Thyroid Function Tests: No results for input(s): TSH, T4TOTAL, FREET4, T3FREE, THYROIDAB in the last 72 hours. Anemia Panel: No results for input(s): VITAMINB12, FOLATE, FERRITIN, TIBC, IRON, RETICCTPCT in the last 72 hours. Urine analysis:    Component Value Date/Time   COLORURINE YELLOW 09/04/2020 [place] (  A) 09/04/2020 1551   LABSPEC  1.022 09/04/2020 1551   PHURINE 6.0 09/04/2020 1551   GLUCOSEU >=500 (A) 09/04/2020 1551   HGBUR SMALL (A) 09/04/2020 1551   BILIRUBINUR NEGATIVE 09/04/2020 1551   BILIRUBINUR NEG 04/05/2013 1057   KETONESUR NEGATIVE 09/04/2020 1551   PROTEINUR NEGATIVE 09/04/2020 1551   UROBILINOGEN 0.2 08/09/2013 2031   NITRITE NEGATIVE 09/04/2020 1551   LEUKOCYTESUR TRACE (A) 09/04/2020 1551   Sepsis Labs: '@LABRCNTIP' (procalcitonin:4,lacticidven:4) )No results found for this or any previous visit (from the past 240 hour(s)).   Radiological Exams on Admission: No results found.  EKG: Independently reviewed.  Sinus tachycardia otherwise no significant findings  Assessment/Plan Principal Problem:   DKA (diabetic ketoacidosis) (HCC) Active Problems:   Anemia due to blood loss, chronic   Anxiety   Acute on chronic congestive heart failure ([place])     #1 DKA: Most likely due to use of too little insulin.  Patient will be admitted.  Initiated DKA protocol with IV insulin and frequent BMP checks.  Monitor patient's response.  Transition to subcutaneous insulin.  #2 acute on chronic congestive heart failure: Appears to be stable.  She has been to the CHF clinic today.  Continue monitoring  #3 anxiety disorder: Continue home regimen  #4 sinus tachycardia: Most likely due to the DKA and dehydration.  Monitor closely  #5 hypokalemia: Replete potassium  #6 leukocytosis: May be related to the DKA continue to monitor  #7 morbid obesity: Dietary counseling   DVT prophylaxis: Lovenox Code Status: Full code Family Communication: No family at bedside Disposition Plan: Home Consults called: None Admission status: Inpatient  Severity of Illness: The appropriate patient status for this patient is INPATIENT. Inpatient status is judged to be reasonable and necessary in order to provide the required intensity of service to ensure the patient's safety. The patient's presenting symptoms, physical exam  findings, and initial radiographic and laboratory data in the context of their chronic comorbidities is felt to place them at high risk for further clinical deterioration. Furthermore, it is not anticipated that the patient will be medically stable for discharge from the hospital within 2 midnights of admission. The following factors support the patient status of inpatient.   " The patient's presenting symptoms include high blood sugar. " The worrisome physical exam findings include morbidly obese with dry mucous membrane. " The initial radiographic and laboratory data are worrisome because of blood sugar more than 600. " The chronic co-morbidities include diabetes and CHF.   * I certify that at the point of admission it is my clinical judgment that the patient will require inpatient hospital care spanning beyond 2 midnights from the point of admission due to high intensity of service, high risk for further deterioration and high frequency of surveillance required.Barbette Merino MD [facility] Pager [phone]  If 7PM-7AM, please contact night-coverage [email or web address] Password [email or web address]  09/04/2020, 8:10 PM

## 2020-09-04 NOTE — Progress Notes (Signed)
Advanced Heart Failure Clinic Note    PCP: Gifford Shave, MD PCP-Cardiologist: Dr. Aundra Dubin  EP- Dr. Curt Bears   HPI: 45 y.o. with history of type 2 diabetes (poorly controlled, last hgbA1c > 15), poorly controlled HTN, hyperlipidemia, paroxysmal atrial fibrillation, and chronic systolic CHF of uncertain etiology.  Patient had an echo in 6/20 as outpatient due to exertional dyspnea. This showed EF 20-25%, moderate RV dysfunction.  She was also noted to have paroxysmal atrial fibrillation.  She was started on Xarelto.  In 7/20, she was admitted with DKA. In 8/20, she was admitted with CHF exacerbation and diuresed.  She was sent home on Lasix 40 mg po bid.   Readmitted on 9/20 with CHF exacerbation, in the setting of elevated BP.  Diuresed with IV lasix.  LHC showed nonobstructive CAD.  RHC showed R>L heart failure with low PAPI suggesting significant RV dysfunction. She was transitioned to PO torsemide and treated w/ guidelines directed medical therapy w/ Entresto, Spironolactone, Coreg and digoxin. She was also transitioned off of Xarelto and placed on Coumadin.  Readmitted 11/28/4330 with A/C systolic heart failure. She had not been taking her lasix because she says it was on her AVS to stop it.  Diuresed with IV lasix and transitioned to torsemide 40 mg daily. Weight day before discharge was 190 pounds. Echo with LV EF <20%, RV severely decreased systolic function.   Echo in 6/21 showed EF 35%, moderately decreased RV systolic function, moderate LAE, mild MR.  Echo was done today and reviewed, EF 35-40%, global hypokinesis, mild LVH, normal RV, IVC normal.   Recently seen by Dr. Aundra Dubin 11/2 and was fluid overloaded. Wt was up 7 lb. ReDs clip elevated at 40%. She was symptomatic, NYHA Class III. Torsemide was increased to 60 mg daily. She was also in sinus tach but already on max dose of Coreg.  Ivabradine 5 mg bid was added. She was also ordered to get a cMRI to look for scar/infiltrative disease  and quantify EF. This has not been done yet.   F/u on 06/16/20. Wt only down 2 lb. ReDs Clip still elevated but down slightly at 38%. EKG shows improved HR. In NSR. HR 84 bpm. BP elevated at 150/100 but she has not yet taken AM meds. Concern that she may have a yeast infection. Notes vaginal irritation/ itching but no dysuria. Has had yeast infections in the past. Also concern that she may be pregnant. She is 7 days late w/ her period. Not using regular birth control.   /22  80 mg Furoscix.   Today she returns for HF follow up.Overall feeling weak. Having some nausea. Says she is thirsty.  DeniesPND/Orthopnea. Says she was SOB walking to the car. Says she felt like her heart was racing today.  Appetite ok. No fever or chills. Did not take morning medications. Didn't eat breakfast.   Labs (5/21): hgbA1c 9.3, BNP 76, K 3.9, creatinine 1.1 Labs (6/21): digoxin 0.4, K 4.1, creatinine 0.77 Labs (10/21): BNP 25, K 4.1, creatinine 0.91 Labs (11/21): K 3.7, creatinine 0.6, digoxin <0.2 Labs (12/21): K 4.2, creatinine 0.83 Labs (07/28/2020): K 3.9 Creatinine 0/7  Labs ( 09/03/20): K 3.8 Creatinine 0.5 Glucose 428   PMH: 1. Type 2 diabetes: H/o DKA 2. HTN 3. OSA 4. Obesity 5. Polycystic ovarian syndrome 6. Chronic systolic CHF: Nonischemic cardiomyopathy - Echo (6/20) with EF 20-25%, moderate RV dysfunction.  - LHC/RHC (9/20): No significant CAD.  - Echo (3/21): EF <20%, moderate LVH with global HK,  severely reduced RV systolic function.  - Echo (6/21): EF 35%, moderately decreased RV systolic function, moderate LAE, mild MR. - Echo (11/21): EF 35-40%, global hypokinesis, mild LVH, normal RV, IVC normal.  7. Hyperlipidemia 8. Atrial fibrillation: Paroxysmal.  9. History of LV thrombus.    Current Outpatient Medications  Medication Sig Dispense Refill  . Accu-Chek Softclix Lancets lancets Use as instructed 100 each 12  . albuterol (VENTOLIN HFA) 108 (90 Base) MCG/ACT inhaler Inhale 2 puffs into  the lungs every 6 (six) hours as needed for wheezing or shortness of breath. 18 g 1  . atorvastatin (LIPITOR) 40 MG tablet TAKE 1 TABLET BY MOUTH DAILY AT 6 PM. 30 tablet 2  . Blood Glucose Monitoring Suppl (ACCU-CHEK AVIVA PLUS) w/Device KIT 1 application by Does not apply route QID. Check fasting sugars as well as throughout the day 1 kit 0  . busPIRone (BUSPAR) 7.5 MG tablet Take 7.5 mg by mouth 2 (two) times daily.    . carvedilol (COREG) 25 MG tablet Take 1 tablet (25 mg total) by mouth 2 (two) times daily with a meal. 180 tablet 3  . digoxin (LANOXIN) 0.125 MG tablet Take 1 tablet (0.125 mg total) by mouth daily. 30 tablet 3  . empagliflozin (JARDIANCE) 25 MG TABS tablet Take 25 mg by mouth daily. 90 tablet 3  . furosemide (LASIX) 20 MG tablet Take 20 mg by mouth daily.    Marland Kitchen gabapentin (NEURONTIN) 100 MG capsule Take 100 mg by mouth 2 (two) times daily.    Marland Kitchen glucose blood (ACCU-CHEK AVIVA PLUS) test strip Check CBG 2 times per day 100 each 12  . glucose blood (AGAMATRIX PRESTO TEST) test strip Use as instructed 100 each 12  . hydrOXYzine (ATARAX/VISTARIL) 10 MG tablet TAKE 1 TABLET (10 MG TOTAL) BY MOUTH 3 (THREE) TIMES DAILY AS NEEDED. 30 tablet 0  . insulin glargine (LANTUS) 100 UNIT/ML injection Inject 0.22 mLs (22 Units total) into the skin daily. 10 mL 11  . isosorbide-hydrALAZINE (BIDIL) 20-37.5 MG tablet Take 0.5 tablets by mouth 3 (three) times daily. 90 tablet 3  . ivabradine (CORLANOR) 5 MG TABS tablet Take 1 tablet (5 mg total) by mouth 2 (two) times daily with a meal. 60 tablet 3  . Lancets Misc. (ACCU-CHEK SOFTCLIX LANCET DEV) KIT 1 application by Does not apply route 2 (two) times daily. 1 kit 0  . meclizine (ANTIVERT) 12.5 MG tablet Take 1 tablet (12.5 mg total) by mouth 3 (three) times daily as needed for dizziness. 30 tablet 0  . potassium chloride SA (KLOR-CON) 20 MEQ tablet Take 20 mEq by mouth See admin instructions. Take 1 tablet by mouth every Monday Wednesday and Friday     . sacubitril-valsartan (ENTRESTO) 97-103 MG Take 1 tablet by mouth 2 (two) times daily. 180 tablet 3  . sertraline (ZOLOFT) 25 MG tablet Take 1 tablet (25 mg total) by mouth daily. 30 tablet 3  . spironolactone (ALDACTONE) 25 MG tablet Take 25 mg by mouth daily.    Marland Kitchen torsemide (DEMADEX) 20 MG tablet Take 100 mg by mouth daily.    . traZODone (DESYREL) 50 MG tablet TAKE 1 TABLET (50 MG TOTAL) BY MOUTH AT BEDTIME AS NEEDED FOR SLEEP. 30 tablet 0  . warfarin (COUMADIN) 7.5 MG tablet Take as directed by Coumadin Clinic (Patient taking differently: Take 15 mg by mouth daily.) 45 tablet 0   No current facility-administered medications for this encounter.    Allergies  Allergen Reactions  . Shellfish  Allergy Swelling    Airway involvement including EMS - Has Epi-Pen  . Dulaglutide Nausea And Vomiting and Other (See Comments)    Multiple episodes of vomiting over multiple days  . Metformin And Related Diarrhea  . Ibuprofen     Per PCP interferes with daily meds       Social History   Socioeconomic History  . Marital status: Single    Spouse name: Not on file  . Number of children: Not on file  . Years of education: Not on file  . Highest education level: Not on file  Occupational History  . Not on file  Tobacco Use  . Smoking status: Never Smoker  . Smokeless tobacco: Never Used  Vaping Use  . Vaping Use: Never used  Substance and Sexual Activity  . Alcohol use: No    Alcohol/week: 0.0 standard drinks  . Drug use: No  . Sexual activity: Yes  Other Topics Concern  . Not on file  Social History Narrative  . Not on file   Social Determinants of Health   Financial Resource Strain: High Risk  . Difficulty of Paying Living Expenses: Hard  Food Insecurity: No Food Insecurity  . Worried About Charity fundraiser in the Last Year: Never true  . Ran Out of Food in the Last Year: Never true  Transportation Needs: No Transportation Needs  . Lack of Transportation (Medical): No   . Lack of Transportation (Non-Medical): No  Physical Activity: Insufficiently Active  . Days of Exercise per Week: 7 days  . Minutes of Exercise per Session: 20 min  Stress: Stress Concern Present  . Feeling of Stress : Very much  Social Connections: Not on file  Intimate Partner Violence: Not At Risk  . Fear of Current or Ex-Partner: No  . Emotionally Abused: No  . Physically Abused: No  . Sexually Abused: No      Family History  Problem Relation Age of Onset  . Diabetes Mother   . Hypertension Mother   . Hypertension Father   . Diabetes Father     Vitals:   09/04/20 1024  BP: 131/86  Pulse: 99  SpO2: 98%  Weight: 85.9 kg (189 lb 6.4 oz)    Wt Readings from Last 3 Encounters:  09/04/20 85.9 kg (189 lb 6.4 oz)  09/03/20 88.2 kg (194 lb 6.4 oz)  07/08/20 90.7 kg (200 lb)   Rdds Clip 31%    PHYSICAL EXAM: General:  Well appearing. No resp difficulty HEENT: normal Neck: supple. no JVD. Carotids 2+ bilat; no bruits. No lymphadenopathy or thryomegaly appreciated. Cor: PMI nondisplaced. Tachy Regular rate & rhythm. No rubs, gallops or murmurs. Lungs: clear Abdomen: soft, nontender, nondistended. No hepatosplenomegaly. No bruits or masses. Good bowel sounds. Extremities: no cyanosis, clubbing, rash, edema Neuro: alert & orientedx3, cranial nerves grossly intact. moves all 4 extremities w/o difficulty. Affect pleasant  EKG: Sinus Tach 125 personally reviewed.   ASSESSMENT & PLAN:  1.Chronic systolic CHF:  Nonischemic cardiomyopathy, diagnosed in 6/20. Repeat Echo 03/2019 showed EF 20-25%, diffuse hypokinesis, LV thrombus, moderate RV systolic dysfunction, mild MR, moderate TR. Echo in 3/21 with EF <30%, severe RV dysfunction. She has a history of paroxysmal atrial fibrillation but seems to be mostly in sinus rhythm. This is not likely to be a tachycardia-mediated car%.diomyopathy. Poorly controlled diabetes itself can cause a cardiomyopathy, as can poorly controlled  HTN. Myocarditis is a potential cause of her cardiomyopathy. [facility] 9/20 showed nonobstructive CAD. RHC showed R>L heart  failure with low PAPI suggesting significant RV dysfunction.  Echo in 6/21 with EF up to 35%. Echo 11/21 EF 35-40% Garde I DD -NYHA III.  Reds Clip 42->31%   -Received 80 mg furoscix 2/10 and I stopped furoscix infusion in the clinic. Reds Clip 31%.  _ Would not start torsemide until Sunday.  She will then take 100 mg daily.  - Continue Entresto 97/103 bid.  - Continue carvedilol 25 mg bid. - Continue ivabradine 5 mg bid.  - Continue spironolactone 25 mg daily.  - Continue digoxin. Last level <0.2 on 06/16/20. - Continue empagliflozin.  - Continue Bidil 1/2 tab tid, would not increase with headaches.   - Hold off on CPX until volume status improved.   - She previously had been trying to get pregnant (has been difficult with polycystic ovarian syndrome). She has 2 grown children already.With her cardiomyopathy and active CHF, pregnancy would create a significant mortality risk for her. A number of her cardiac meds to control her CHF are feto-toxic.  - discussed importance of strict regular birth control/ use of condoms to avoid pregnancy. She is aware of risk but not planning to try and avoid pregnancy.    2. LV thrombus - LV thrombus noted on echo 9/20.  - Compliant w/ coumadin. No bleeding issues.  - INRs followed by PCP.  3. Atrial fibrillation, Paroxysmal.  - Continue warfarin (using warfarin rather than DOAC with h/o LV thrombus).  - Would consider atrial fibrillation ablation in the future, if recurrence, given CASTLE-HF data.   4. Type 2 diabetes  -Needs follow up with PCP - Continue Jardiance + insulin glargine. - Last A1c 9.3 on 5/21. - CBG check in clinic and read as high suggesting reading > 600. Given 20 units regular insulin in the clinic.  - Stat BMET obtained. Glucose 734  - Discussed with Dr Aundra Dubin and he recommended ED evaluation. [facility] to [facility] ED    5. Hypertension  Controlled.   6. Yeast Infection  - Resolved with fluconazole. If reoccurs will need to stop.  - Continue Jardiance for both systolic heart failure and DM.   7. Sinus Tach  -EKG today Sinus Tach.  - Has not had today dose of carvedilol and suspect over diuresed.    Sending to the ED for an evaluation. Glucose >700. Holding diuretics for now.   Darrick Grinder, NP 09/04/20

## 2020-09-04 NOTE — Research (Signed)
            Composite Congestion Score (CCS) Indicate the subject's current CCS Score at the present time (select for each signs/symptoms) Was CCS Performed:   ?  [x] Yes ?  [] No (Complete Protocol Deviation)     Signs/Symptoms 0 - None  1 -Seldom 2 -Frequent 3 -Continuous  Dyspnoea []  []  [x]  []   Orthopnoea []  []  [x]  []   Fatigue []  []  [x]  []    Signs/Symptoms 0 - <=6  1 - 6-9 2 - 10-15 3 - >=15  JVD (cm H2O) [x]  []  []  []    Signs/Symptoms 0 -None  1 -Bases 2 -To <50% 3 -To >50%  Rales [x]  []  []  []    Signs/Symptoms 0 -Absent/ trace  1 -Slight 2 -Moderate 3 -Marked  Oedema [x]  []  []  []

## 2020-09-04 NOTE — ED Notes (Signed)
Pt provided [place] sandwich & sprite

## 2020-09-04 NOTE — Research (Addendum)
     AT [facility] Cardiopulmonary Examination & Physical Examination Summary    Cardiopulmonary Exam  Was the CP Exam Performed? [x]  Yes (Complete Below) []  No (Complete Protocol Deviation)      Heart Sounds  S3: [x]  Absent []  Present   S4: [x]  Absent []  Present  Murmur  Murmur: []  Yes [x]  No   If Yes:  []  Systolic []  Diastolic   If yes select grade: []  I/VI []  II/VI []  III/VI []  IV/VI []  V/VI []  VI/VI   Chest Sounds  Rales: []  Yes [x]  No   If yes check one: []  ? 1/3 of lung fields full []  1/2 of lung fields full []  > 1/2 of lung fields full  If yes check one: []  Left []  Right []  Bilateral    Edema   Edema (pedal): [x]  Absent []  Trace []  1+ (slight) []  2+ (moderate) []  3+ (severe)    Hepatomegaly  Hepatomegaly: [x]  None []  < 2 cm []  2-4 cm []  > 4 cm  Peripheral Pulses  Peripheral Pulses: [x]  Normal []  Abnormal   If Abnormal, explain:             Physical Exam   Was the physical Exam Performed?  [x]  Yes        []  No (complete protocol deviation)    . Lungs/Chest?     [x]  Normal        []  Abnormal        []  Not Done  If abnormal, was it clinically significant?     []  Yes   []  No       . Dermatologic (Abdominal Area):   [x]  Normal        []  Abnormal        []  Not Done   If abnormal, was it clinically significant?  []  Yes   []  No       . Periphery:     [x]  Normal          []  Abnormal         []  Not Done   If abnormal, was it clinically significant?  []  Yes    []  No      . Other Body System Examined?  []  Yes        [x]  No          New York Heart Association (NYHA) Heart Failure Classification Indicate the subject's current NYHA Classification at the present time (check only one) Was the NYHA Performed:    ?[x]  Yes  ?  [] No (Complete Protocol Deviation)     []   Class I:  Patients have cardiac disease but without the resulting limitations of physical activity. Ordinary physical activity does  not cause undue fatigue, palpitation, dyspnea or anginal pain.     []   Class II:    Patients have cardiac disease resulting in slight limitation of physical activity. They are comfortable at rest. Ordinary physical activity results in fatigue, palpitation, dyspnea or anginal pain.     [x]   Class III:  Patients have cardiac disease resulting in marked limitation of physical activity. They are comfortable at rest. Less than ordinary physical activity causes fatigue, palpitation, dyspnea or anginal pain.     []   Class IV: Patients have cardiac disease resulting in inability to carry on any physical activity without discomfort. Symptoms of cardiac insufficiency or of the anginal syndrome may be present even at rest. If any physical activity is undertaken, discomfort is increased.

## 2020-09-04 NOTE — ED Notes (Signed)
MD messaged regarding pt's request for food/diet order

## 2020-09-04 NOTE — ED Triage Notes (Signed)
Patient sent from heart failure clinic for hyperglycemia, patient has history of diabetes. Patient states she does not check her blood sugar daily and does not have a glucometer at home. Patient takes 22 units of lantus every evening. Patient is alert and oriented but reports feeling tired.

## 2020-09-04 NOTE — Patient Instructions (Addendum)
HOLD Torsemide Friday and Saturday Restart on Sunday as prescribed (100 mg daily)  Labs today We will only contact you if something comes back abnormal or we need to make some changes. Otherwise no news is good news!  Keep follow as scheduled  Do the following things EVERYDAY: 1) Weigh yourself in the morning before breakfast. Write it down and keep it in a log. 2) Take your medicines as prescribed 3) Eat low salt foods--Limit salt (sodium) to 2000 mg per day.  4) Stay as active as you can everyday 5) Limit all fluids for the day to less than 2 liters  At the [facility], you and your health needs are our priority. As part of our continuing mission to provide you with exceptional heart care, we have created designated Provider Care Teams. These Care Teams include your primary Cardiologist (physician) and Advanced Practice Providers (APPs- Physician Assistants and Nurse Practitioners) who all work together to provide you with the care you need, when you need it.   You may see any of the following providers on your designated Care Team at your next follow up: Marland Kitchen Dr Glori Bickers . Dr Loralie Champagne . Darrick Grinder, NP . Lyda Jester, [place] . [name], PharmD   Please be sure to bring in all your medications bottles to every appointment.

## 2020-09-05 DIAGNOSIS — I480 Paroxysmal atrial fibrillation: Secondary | ICD-10-CM

## 2020-09-05 DIAGNOSIS — I5042 Chronic combined systolic (congestive) and diastolic (congestive) heart failure: Secondary | ICD-10-CM | POA: Diagnosis not present

## 2020-09-05 DIAGNOSIS — E0810 Diabetes mellitus due to underlying condition with ketoacidosis without coma: Secondary | ICD-10-CM | POA: Diagnosis not present

## 2020-09-05 DIAGNOSIS — I110 Hypertensive heart disease with heart failure: Secondary | ICD-10-CM

## 2020-09-05 DIAGNOSIS — E1010 Type 1 diabetes mellitus with ketoacidosis without coma: Secondary | ICD-10-CM | POA: Diagnosis not present

## 2020-09-05 LAB — BASIC METABOLIC PANEL
Anion gap: 10 (ref 5–15)
Anion gap: 12 (ref 5–15)
BUN: 10 mg/dL (ref 6–20)
BUN: 11 mg/dL (ref 6–20)
CO2: 25 mmol/L (ref 22–32)
CO2: 25 mmol/L (ref 22–32)
Calcium: 8.7 mg/dL — ABNORMAL LOW (ref 8.9–10.3)
Calcium: 9 mg/dL (ref 8.9–10.3)
Chloride: 100 mmol/L (ref 98–111)
Chloride: 97 mmol/L — ABNORMAL LOW (ref 98–111)
Creatinine, Ser: 0.69 mg/dL (ref 0.44–1.00)
Creatinine, Ser: 0.76 mg/dL (ref 0.44–1.00)
GFR, Estimated: >60 mL/min (ref >60)
GFR, Estimated: >60 mL/min (ref >60)
Glucose, Bld: 139 mg/dL — ABNORMAL HIGH (ref 70–99)
Glucose, Bld: 250 mg/dL — ABNORMAL HIGH (ref 70–99)
Potassium: 3 mmol/L — ABNORMAL LOW (ref 3.5–5.1)
Potassium: 3.7 mmol/L (ref 3.5–5.1)
Sodium: 135 mmol/L (ref 135–145)
Sodium: 134 mmol/L — ABNORMAL LOW (ref 135–145)

## 2020-09-05 LAB — BETA-HYDROXYBUTYRIC ACID
Beta-Hydroxybutyric Acid: 0.08 mmol/L (ref 0.05–0.27)
Beta-Hydroxybutyric Acid: 1.36 mmol/L — ABNORMAL HIGH (ref 0.05–0.27)

## 2020-09-05 LAB — HEMOGLOBIN A1C
Hgb A1c MFr Bld: 14 % — ABNORMAL HIGH (ref 4.8–5.6)
Mean Plasma Glucose: 355.1 mg/dL

## 2020-09-05 LAB — CBG MONITORING, ED
Glucose-Capillary: 124 mg/dL — ABNORMAL HIGH (ref 70–99)
Glucose-Capillary: 168 mg/dL — ABNORMAL HIGH (ref 70–99)
Glucose-Capillary: 182 mg/dL — ABNORMAL HIGH (ref 70–99)
Glucose-Capillary: 238 mg/dL — ABNORMAL HIGH (ref 70–99)
Glucose-Capillary: 315 mg/dL — ABNORMAL HIGH (ref 70–99)

## 2020-09-05 LAB — PROTIME-INR
INR: 1.1 (ref 0.8–1.2)
Prothrombin Time: 13.6 seconds (ref 11.4–15.2)

## 2020-09-05 MED ORDER — BLOOD GLUCOSE MONITOR KIT: PACK | 0 refills | Status: DC

## 2020-09-05 MED ORDER — CARVEDILOL 12.5 MG PO TABS
25.0000 mg | ORAL_TABLET | Freq: Two times a day (BID) | ORAL | Status: DC
  Administered 2020-09-05: 25 mg via ORAL
  Filled 2020-09-05: qty 8

## 2020-09-05 MED ORDER — INSULIN SYRINGE 31G X 5/16" 0.3 ML MISC
15.0000 [IU] | Freq: Two times a day (BID) | 3 refills | Status: DC
Start: 2020-09-05 — End: 2022-05-06

## 2020-09-05 MED ORDER — POTASSIUM CHLORIDE CRYS ER 20 MEQ PO TBCR
40.0000 meq | EXTENDED_RELEASE_TABLET | Freq: Once | ORAL | Status: AC
  Administered 2020-09-05: 40 meq via ORAL
  Filled 2020-09-05: qty 2

## 2020-09-05 MED ORDER — ATORVASTATIN CALCIUM 40 MG PO TABS
40.0000 mg | ORAL_TABLET | Freq: Every day | ORAL | Status: DC
  Administered 2020-09-05: 40 mg via ORAL
  Filled 2020-09-05: qty 1

## 2020-09-05 MED ORDER — SERTRALINE HCL 25 MG PO TABS
25.0000 mg | ORAL_TABLET | Freq: Every day | ORAL | Status: DC
  Administered 2020-09-05: 25 mg via ORAL
  Filled 2020-09-05: qty 1

## 2020-09-05 MED ORDER — DIGOXIN 125 MCG PO TABS
0.1250 mg | ORAL_TABLET | Freq: Every day | ORAL | Status: DC
  Administered 2020-09-05: 0.125 mg via ORAL
  Filled 2020-09-05: qty 1

## 2020-09-05 MED ORDER — BUSPIRONE HCL 15 MG PO TABS
7.5000 mg | ORAL_TABLET | Freq: Two times a day (BID) | ORAL | Status: DC
  Administered 2020-09-05: 7.5 mg via ORAL
  Filled 2020-09-05: qty 1

## 2020-09-05 MED ORDER — INSULIN ASPART 100 UNIT/ML ~~LOC~~ SOLN
0.0000 [IU] | Freq: Three times a day (TID) | SUBCUTANEOUS | Status: DC
  Administered 2020-09-05: 5 [IU] via SUBCUTANEOUS

## 2020-09-05 MED ORDER — LORAZEPAM 2 MG/ML IJ SOLN
1.0000 mg | Freq: Once | INTRAMUSCULAR | Status: AC
  Administered 2020-09-05: 1 mg via INTRAVENOUS
  Filled 2020-09-05: qty 1

## 2020-09-05 MED ORDER — TORSEMIDE 20 MG PO TABS: 100.0000 mg | ORAL_TABLET | Freq: Every day | ORAL | Status: DC

## 2020-09-05 MED ORDER — INSULIN ASPART 100 UNIT/ML ~~LOC~~ SOLN: 0.0000 [IU] | Freq: Every day | SUBCUTANEOUS | Status: DC

## 2020-09-05 MED ORDER — INSULIN GLARGINE 100 UNIT/ML ~~LOC~~ SOLN
20.0000 [IU] | Freq: Every day | SUBCUTANEOUS | Status: DC
  Administered 2020-09-05: 20 [IU] via SUBCUTANEOUS
  Filled 2020-09-05: qty 0.2

## 2020-09-05 MED ORDER — ISOSORB DINITRATE-HYDRALAZINE 20-37.5 MG PO TABS
0.5000 | ORAL_TABLET | Freq: Three times a day (TID) | ORAL | Status: DC
  Administered 2020-09-05: 0.5 via ORAL
  Filled 2020-09-05 (×2): qty 0.5

## 2020-09-05 MED ORDER — ACETAMINOPHEN 325 MG PO TABS
650.0000 mg | ORAL_TABLET | Freq: Once | ORAL | Status: AC
  Administered 2020-09-05: 650 mg via ORAL
  Filled 2020-09-05: qty 2

## 2020-09-05 MED ORDER — INSULIN ASPART PROT & ASPART (70-30 MIX) 100 UNIT/ML ~~LOC~~ SUSP: 15.0000 [IU] | Freq: Two times a day (BID) | SUBCUTANEOUS | 11 refills | Status: DC

## 2020-09-05 MED ORDER — WARFARIN - PHARMACIST DOSING INPATIENT: Freq: Every day | Status: DC

## 2020-09-05 NOTE — Progress Notes (Signed)
Inpatient Diabetes Program Recommendations  AACE/ADA: New Consensus Statement on Inpatient Glycemic Control (2015)  Target Ranges:  Prepandial:   less than 140 mg/dL      Peak postprandial:   less than 180 mg/dL (1-2 hours)      Critically ill patients:  140 - 180 mg/dL   Lab Results  Component Value Date   GLUCAP 238 (H) 09/05/2020   HGBA1C 13.6 (H) 09/04/2020    Review of Glycemic Control Results for MARILU, RYLANDER (MRN [id number]) as of 09/05/2020 08:41  Ref. Range 09/05/2020 04:17 09/05/2020 05:52 09/05/2020 08:06  Glucose-Capillary Latest Ref Range: 70 - 99 mg/dL 124 (H) 168 (H) 238 (H)   Diabetes history: Type 2 DM Outpatient Diabetes medications: Jardiance 25 mg QD, Lantus 22 units QD Current orders for Inpatient glycemic control: Lantus 20 units QD, Novolog 0-15 units TID, Novolog 0-5 units QHS  Inpatient Diabetes Program Recommendations:    Spoke with patient regarding outpatient diabetes management. Patient verifies home medications and very outwardly expresses frustration with visit. Denies missing doses, however when asked question differently, patient admits to missing doses occasionally. Does not check CBGs at home. Reviewed patient's current A1c of 13.6%. Explained what a A1c is and what it measures. Also reviewed goal A1c with patient, importance of good glucose control @ home, and blood sugar goals. Reviewed patho of DM, need for insulin, role of pancreas, DKA, impact of missing doses, impact with heart failure, vascular changes and comorbidities.  Patient will need a meter at discharge. Blood glucose meter (inlcudes lancets and strips)(#[id number]). Encouraged to check atleast 2 times per day. Reviewed extensively when to call MD and make appointment. Patient throughout conversation very defensive and blaming of medical system. Attempted to provide reassurance that early communication with provider on elevated blood sugar levels could prevent hospitalization and risk factors.  Patient is followed at [facility] family practice and encouraged to make another appointment.  Patient not forthcoming on diet, states, "I just go out a lot and drink water." Reminded of importance of CHO mindfulness, alternatives for sugary beverages and trying to incorporate protein into plate method.   Patient in agreement to twice a day injections using Novolog 70/30. Discussed Relion products at [facility] so patient will have better access. Reviewed importance of eating meal with injections. Anticipate glucose trends to be elevated today due to lack of transition time with Lantus administration and stopping IV insulin early.   Consider Novolog 70/30 15 units BID (to start with dinner tonight). Patient prefers vial and syringe. Anticipate need for further titration outpatient setting. Will place [facility] consult.   Thanks, Bronson Curb, MSN, RNC-OB Diabetes Coordinator [phone] (8a-5p)  [place]

## 2020-09-05 NOTE — ED Notes (Addendum)
Endotool discontinued per DKA protocol. MD messaged regarding future insulin order

## 2020-09-05 NOTE — Progress Notes (Signed)
At Home-HF  Telephone Script Day: Day 2    Date of Phone Call: 09/05/2020                                    Time: 0923                                                    Subject ID: [id number]        Phone Call Performed:  [x]  Yes  []  No  If No,   []  Subject did not respond   []  Subject unavailable   []  Other, specify ___________     Weight and Blood pressure:  1. Did you use the provided scale to weigh yourself this morning? []  Yes  [x]  No   If Yes, Did you document it in the diary? []  Yes [x]  No  What was your weight today? _____ lbs.  If No, please ask to the subject to weigh himself/herself now and obtain the measurement. _____ lbs.   Subject is unable to obtain vitals at this time as she is in the emergency room for DKA. AE will be reported.   2. Did you use the provided "cuff" to take your Blood Pressure and Heart Rate this morning? []  Yes  [x]  No  If yes, did you document it in the Diary?  []  Yes  [x]  No    What was your Blood Pressure and Heart Rate today?   Heart Rate: _______           Blood Pressure: ________         Patient Global Assessment Visual Analog Scale (VAS): Please ask if subject completed the Visual Analog Scale in the Diary.  []  Yes [x]  No If No, Ask to complete now and remind the subject to complete it before next scheduled call or visit.   Subject is unable to obtain vitals and complete VAS  at this time as she is in the emergency room for DKA. AE will be reported.      5-Point Likert Scale: Current Dyspnea Status How much difficulty are you having in breathing now?  [x]  not short of breath               []  mildly short of breath             []  moderately short of breath             []  severely short of breath                  []  very severely short of breath     7-Point Likert Scale: Patient-Assessed Dyspnea Status Compared to how much difficulty you were having with your breathing just before starting in the study, how  is your breathing now?  []  Markedly better       []  Moderately better       []  Minimally better       [x]  No change       []  Minimally worse       []  Moderately worse       []  Markedly worse       Medications/Changes and Medical Events:  1. How much oral diuretic did you take yesterday? none  2. Did you document in the Diary start time and end time for Furoscix Infusor (if applicable)? [x]  Yes []  No []  N/A   3. Have you had any problems/issues with Infusor? []  Yes [x]  No []  N/A If yes, please describe: ______________________________________________   4. Since your last visit/call, have you started any new medications or have your medications changed? (review compare to last visit/call)  [x]  Yes []  No If yes, please specify The subject is presently in the emergency department on IV insulin. The AE will be reported along with all medications.   5. Since your last visit/call, have you had any medical events, have you visited a hospital, Emergency Department or seen any other health care provider?  [x]  Yes []  No If yes, please describe (including treatments): The patient is presently in the emergency department for DKA. AE will be reported.

## 2020-09-05 NOTE — Progress Notes (Signed)
ANTICOAGULATION CONSULT NOTE - Initial Consult  Pharmacy Consult for Coumadin Indication: atrial fibrillation  Allergies  Allergen Reactions  . Shellfish Allergy Swelling    Airway involvement including EMS - Has Epi-Pen  . Dulaglutide Nausea And Vomiting and Other (See Comments)    Multiple episodes of vomiting over multiple days  . Metformin And Related Diarrhea  . Ibuprofen     Per PCP interferes with daily meds     Patient Measurements:    Vital Signs: BP: 134/85 (02/12 0500) Pulse Rate: 102 (02/12 0515)  Labs: Recent Labs    09/02/20 0948 09/03/20 1111 09/04/20 1331 09/04/20 1613 09/04/20 2207 09/05/20 0400  HGB  --    < > 13.7 16.0* 11.4*  --   HCT  --    < > 43.1 47.0* 36.3  --   PLT  --   --  431*  --  342  --   INR 1.2*  --   --   --   --   --   CREATININE  --    < > 1.20*  --  0.79 0.69   < > = values in this interval not displayed.    Estimated Creatinine Clearance: 87.2 mL/min (by C-G formula based on SCr of 0.69 mg/dL).   Medical History: Past Medical History:  Diagnosis Date  . Abnormal Pap smear   . Acute on chronic combined systolic and diastolic CHF (congestive heart failure) ([place]) 02/19/2019  . Acute on chronic congestive heart failure ([place])   . Anemia   . Asthma   . Atrial fibrillation ([place])   . Diabetes mellitus   . DKA, type 2 ([place]) 02/06/2019  . Heart failure with reduced ejection fraction ([place])   . Hypertension   . 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    . Migraines   . Morbid obesity ([place]) 02/06/2019  . Obstructive sleep apnea 06/30/2015  . Polycystic ovarian disease     Medications:  Current Facility-Administered Medications on File Prior to Encounter  Medication Dose Route Frequency Provider Last Rate Last Admin  . [COMPLETED] insulin aspart (novoLOG) injection 20 Units  20 Units Subcutaneous Once Rafael Bihari,  [place]   20 Units at 09/04/20 1156   Current Outpatient Medications on File Prior to Encounter  Medication Sig Dispense Refill  . Accu-Chek Softclix Lancets lancets Use as instructed 100 each 12  . albuterol (VENTOLIN HFA) 108 (90 Base) MCG/ACT inhaler Inhale 2 puffs into the lungs every 6 (six) hours as needed for wheezing or shortness of breath. 18 g 1  . atorvastatin (LIPITOR) 40 MG tablet TAKE 1 TABLET BY MOUTH DAILY AT 6 PM. 30 tablet 2  . Blood Glucose Monitoring Suppl (ACCU-CHEK AVIVA PLUS) w/Device KIT 1 application by Does not apply route QID. Check fasting sugars as well as throughout the day 1 kit 0  . busPIRone (BUSPAR) 7.5 MG tablet Take 7.5 mg by mouth 2 (two) times daily.    . carvedilol (COREG) 25 MG tablet Take 1 tablet (25 mg total) by mouth 2 (two) times daily with a meal. 180 tablet 3  . digoxin (LANOXIN) 0.125 MG tablet Take 1 tablet (0.125 mg total) by mouth daily. 30 tablet 3  . empagliflozin (JARDIANCE) 25 MG TABS tablet Take 25 mg by mouth daily. 90 tablet 3  . furosemide (LASIX) 20 MG tablet Take 20 mg by mouth  daily.    . gabapentin (NEURONTIN) 100 MG capsule Take 100 mg by mouth 2 (two) times daily.    Marland Kitchen glucose blood (ACCU-CHEK AVIVA PLUS) test strip Check CBG 2 times per day 100 each 12  . glucose blood (AGAMATRIX PRESTO TEST) test strip Use as instructed 100 each 12  . hydrOXYzine (ATARAX/VISTARIL) 10 MG tablet TAKE 1 TABLET (10 MG TOTAL) BY MOUTH 3 (THREE) TIMES DAILY AS NEEDED. 30 tablet 0  . insulin glargine (LANTUS) 100 UNIT/ML injection Inject 0.22 mLs (22 Units total) into the skin daily. 10 mL 11  . isosorbide-hydrALAZINE (BIDIL) 20-37.5 MG tablet Take 0.5 tablets by mouth 3 (three) times daily. 90 tablet 3  . ivabradine (CORLANOR) 5 MG TABS tablet Take 1 tablet (5 mg total) by mouth 2 (two) times daily with a meal. 60 tablet 3  . Lancets Misc. (ACCU-CHEK SOFTCLIX LANCET DEV) KIT 1 application by Does not apply route 2 (two) times daily. 1 kit 0  . meclizine  (ANTIVERT) 12.5 MG tablet Take 1 tablet (12.5 mg total) by mouth 3 (three) times daily as needed for dizziness. 30 tablet 0  . potassium chloride SA (KLOR-CON) 20 MEQ tablet Take 20 mEq by mouth See admin instructions. Take 1 tablet by mouth every Monday Wednesday and Friday    . sacubitril-valsartan (ENTRESTO) 97-103 MG Take 1 tablet by mouth 2 (two) times daily. 180 tablet 3  . sertraline (ZOLOFT) 25 MG tablet Take 1 tablet (25 mg total) by mouth daily. 30 tablet 3  . spironolactone (ALDACTONE) 25 MG tablet Take 25 mg by mouth daily.    Marland Kitchen torsemide (DEMADEX) 20 MG tablet Take 100 mg by mouth daily.    . traZODone (DESYREL) 50 MG tablet TAKE 1 TABLET (50 MG TOTAL) BY MOUTH AT BEDTIME AS NEEDED FOR SLEEP. 30 tablet 0  . warfarin (COUMADIN) 7.5 MG tablet Take as directed by Coumadin Clinic (Patient taking differently: Take 15 mg by mouth daily.) 45 tablet 0     Assessment: 45 y.o. female admitted with DKA, h/o Afib, to continue Coumadin.  Last INR 1.2 2/9 in office--current regimen Coumadin 11.25 mg daily except 7.5 mg Sundays.    Goal of Therapy:  INR 2-3 Monitor platelets by anticoagulation protocol: Yes   Plan:  F/U daily INR  Caryl Pina 09/05/2020,6:19 AM

## 2020-09-05 NOTE — ED Notes (Signed)
CBG 179, not crossing over via glucometer

## 2020-09-05 NOTE — Discharge Summary (Signed)
Physician Discharge Summary   Patient ID: PAMELLA SAMONS MRN: [id number] DOB/AGE: 43-Feb-1977 43 y.o.  Admit date: 09/04/2020 Discharge date: 09/05/2020  Primary Care Physician:  Gifford Shave, MD   Recommendations for Outpatient Follow-up:  1. Follow up with PCP in 1-2 weeks 2. Hemoglobin A1c 13.6, poor control.  Insulin regimen changed to NovoLog 70/30 15 units twice daily for better compliance  Home Health: None  Equipment/Devices:   Discharge Condition: stable  CODE STATUS: FULL  Diet recommendation: Carb modified diet   Discharge Diagnoses:    . DKA (diabetic ketoacidosis) ([place]) Diabetes mellitus, type I poorly controlled .  chronic combined systolic and diastolic CHF Paroxysmal atrial fibrillation Morbid obesity Obstructive sleep apnea . Anxiety and depression Essential hypertension Mild acute kidney injury  Consults: Diabetic coordinator    Allergies:   Allergies  Allergen Reactions  . Shellfish Allergy Swelling    Airway involvement including EMS - Has Epi-Pen  . Dulaglutide Nausea And Vomiting and Other (See Comments)    Multiple episodes of vomiting over multiple days  . Metformin And Related Diarrhea  . Ibuprofen     Per PCP interferes with daily meds      DISCHARGE MEDICATIONS: Allergies as of 09/05/2020      Reactions   Shellfish Allergy Swelling   Airway involvement including EMS - Has Epi-Pen   Dulaglutide Nausea And Vomiting, Other (See Comments)   Multiple episodes of vomiting over multiple days   Metformin And Related Diarrhea   Ibuprofen    Per PCP interferes with daily meds       Medication List    STOP taking these medications   furosemide 20 MG tablet Commonly known as: LASIX   insulin glargine 100 UNIT/ML injection Commonly known as: LANTUS     TAKE these medications   Accu-Chek Aviva Plus w/Device Kit 1 application by Does not apply route QID. Check fasting sugars as well as throughout the day   Accu-Chek Softclix  Lancet Dev Kit 1 application by Does not apply route 2 (two) times daily.   Accu-Chek Softclix Lancets lancets Use as instructed   albuterol 108 (90 Base) MCG/ACT inhaler Commonly known as: VENTOLIN HFA Inhale 2 puffs into the lungs every 6 (six) hours as needed for wheezing or shortness of breath.   atorvastatin 40 MG tablet Commonly known as: LIPITOR TAKE 1 TABLET BY MOUTH DAILY AT 6 PM.   BiDil 20-37.5 MG tablet Generic drug: isosorbide-hydrALAZINE Take 0.5 tablets by mouth 3 (three) times daily.   blood glucose meter kit and supplies Kit Dispense based on patient and insurance preference. Use up to four times daily as directed. (FOR ICD-9 250.00, 250.01).   busPIRone 7.5 MG tablet Commonly known as: BUSPAR Take 7.5 mg by mouth 2 (two) times daily.   carvedilol 25 MG tablet Commonly known as: COREG Take 1 tablet (25 mg total) by mouth 2 (two) times daily with a meal.   digoxin 0.125 MG tablet Commonly known as: LANOXIN Take 1 tablet (0.125 mg total) by mouth daily.   empagliflozin 25 MG Tabs tablet Commonly known as: JARDIANCE Take 25 mg by mouth daily.   gabapentin 100 MG capsule Commonly known as: NEURONTIN Take 100 mg by mouth 2 (two) times daily.   glucose blood test strip Commonly known as: AgaMatrix Presto Test Use as instructed   Accu-Chek Aviva Plus test strip Generic drug: glucose blood Check CBG 2 times per day   hydrOXYzine 10 MG tablet Commonly known as: ATARAX/VISTARIL TAKE 1  TABLET (10 MG TOTAL) BY MOUTH 3 (THREE) TIMES DAILY AS NEEDED.   insulin aspart protamine- aspart (70-30) 100 UNIT/ML injection Commonly known as: NovoLOG Mix 70/30 Inject 0.15 mLs (15 Units total) into the skin 2 (two) times daily with a meal.   INSULIN SYRINGE .3CC/31GX5/16" 31G X 5/16" 0.3 ML Misc 15 Units by Does not apply route 2 (two) times daily.   ivabradine 5 MG Tabs tablet Commonly known as: Corlanor Take 1 tablet (5 mg total) by mouth 2 (two) times daily  with a meal.   meclizine 12.5 MG tablet Commonly known as: ANTIVERT Take 1 tablet (12.5 mg total) by mouth 3 (three) times daily as needed for dizziness.   potassium chloride SA 20 MEQ tablet Commonly known as: KLOR-CON Take 20 mEq by mouth See admin instructions. Take 1 tablet by mouth every Monday Wednesday and Friday   sacubitril-valsartan 97-103 MG Commonly known as: ENTRESTO Take 1 tablet by mouth 2 (two) times daily.   sertraline 25 MG tablet Commonly known as: Zoloft Take 1 tablet (25 mg total) by mouth daily.   spironolactone 25 MG tablet Commonly known as: ALDACTONE Take 25 mg by mouth daily.   torsemide 20 MG tablet Commonly known as: DEMADEX Take 5 tablets (100 mg total) by mouth daily. Start taking on: September 06, 2020   traZODone 50 MG tablet Commonly known as: DESYREL TAKE 1 TABLET (50 MG TOTAL) BY MOUTH AT BEDTIME AS NEEDED FOR SLEEP.   warfarin 7.5 MG tablet Commonly known as: COUMADIN Take as directed. If you are unsure how to take this medication, talk to your nurse or doctor. Original instructions: Take as directed by Coumadin Clinic What changed:   how much to take  how to take this  when to take this  additional instructions        Brief H and P: For complete details please refer to admission H and P, but in brief, RYLA CAUTHON is a 45 y.o. female with medical history significant of type 1 diabetes, history of chronic combined systolic and diastolic CHF, A. fib, asthma morbid obesity, obstructive sleep apnea and polycystic ovarian disease also known hypertension depressive disorder.  Patient went today heart failure clinic today.  As part of her work-up she was noted to be hyperglycemic with blood sugar in the 600s.  She was also noted to be weak.  She was tachypneic.  Patient sent over to the ER for evaluation and treatment.  In the ER patient was found to have anion gap of 18 and significant blood sugar again more than 600.  Patient denied  ever having DKA's.  She apparently takes 22 units of Lantus every evening.  She has not been checking her blood sugar frequently.  She denied any abdominal pain nausea or vomiting.  Denied any missed doses of her insulin.  Patient seen and evaluated and being admitted for DKA..  ED Course: Temperature is 98.9 blood pressure 154/97 pulse 142 respirate of 34 oxygen sat 94% on room air.  White count is 14.0 hemoglobin 11.4 platelets 342.  Sodium 133 potassium 3.2 chloride 96.  Calcium is 8.6 with CO2 26.    Patient was admitted for DKA  Hospital Course:     DKA (diabetic ketoacidosis) ([place]) in the setting of diabetes mellitus 1, IDDM, uncontrolled with hyperglycemia -CBG 734 at the time of admission, bicarb 22, anion gap 17, sodium 127 -Patient was admitted and placed on IV fluid hydration, IV insulin drip.  Torsemide was held -  Once gap was closed, no nausea or vomiting, CBGs stable, patient was transitioned to subcutaneous insulin -HbA1c 13.6.  Diabetic coordinator consult was placed who discussed in detail with the patient.  She is in agreement to twice a day injections using NovoLog 70/30.  Patient was also recommended carb modified diet and encouraged to check her blood sugars frequently. -Placed on NovoLog 70/30 15 units twice a day, continue Jardiance  Chronic systolic and diastolic CHF -Currently euvolemic, patient was given IV fluid hydration due to DKA, mild acute kidney injury, dehydration.  Torsemide was held -2D echo 05/2020 showed EF 35 to 92%, grade 1 diastolic dysfunction  Mild acute kidney injury -Likely due to dehydration and DKA, creatinine was 1.24 at the time of mission Torsemide, spironolactone, Entresto were held, patient was given IV fluid hydration -Currently stable, patient recommended to resume cardiac meds, torsemide on 2/13  History of paroxysmal atrial fibrillation -continue beta-blocker, warfarin  Morbid obesity -BMI unknown at this time, patient was recommended  diet and lifestyle changes, weight control  Day of Discharge S: No acute complaints, transition to subcu insulin, wants to go home  BP 139/89   Pulse (!) 102   Temp 98.4 F (36.9 C) (Oral)   Resp (!) 21   SpO2 97%   Physical Exam: General: Alert and awake oriented x3 not in any acute distress. HEENT: anicteric sclera, pupils reactive to light and accommodation CVS: S1-S2 clear no murmur rubs or gallops Chest: clear to auscultation bilaterally, no wheezing rales or rhonchi Abdomen: soft nontender, nondistended, normal bowel sounds Extremities: no cyanosis, clubbing or edema noted bilaterally Neuro: Cranial nerves II-XII intact, no focal neurological deficits    Get Medicines reviewed and adjusted: Please take all your medications with you for your next visit with your Primary MD  Please request your Primary MD to go over all hospital tests and procedure/radiological results at the follow up. Please ask your Primary MD to get all Hospital records sent to his/her office.  If you experience worsening of your admission symptoms, develop shortness of breath, life threatening emergency, suicidal or homicidal thoughts you must seek medical attention immediately by calling 911 or calling your MD immediately  if symptoms less severe.  You must read complete instructions/literature along with all the possible adverse reactions/side effects for all the Medicines you take and that have been prescribed to you. Take any new Medicines after you have completely understood and accept all the possible adverse reactions/side effects.   Do not drive when taking pain medications.   Do not take more than prescribed Pain, Sleep and Anxiety Medications  Special Instructions: If you have smoked or chewed Tobacco  in the last 2 yrs please stop smoking, stop any regular Alcohol  and or any Recreational drug use.  Wear Seat belts while driving.  Please note  You were cared for by a hospitalist during  your hospital stay. Once you are discharged, your primary care physician will handle any further medical issues. Please note that NO REFILLS for any discharge medications will be authorized once you are discharged, as it is imperative that you return to your primary care physician (or establish a relationship with a primary care physician if you do not have one) for your aftercare needs so that they can reassess your need for medications and monitor your lab values.   The results of significant diagnostics from this hospitalization (including imaging, microbiology, ancillary and laboratory) are listed below for reference.      Procedures/Studies:  No results found.    LAB RESULTS: Basic Metabolic Panel: Recent Labs  Lab 09/04/20 1116 09/04/20 1331 09/05/20 0400 09/05/20 0814  NA 127*   < > 135 134*  K 3.5   < > 3.0* 3.7  CL 88*   < > 100 97*  CO2 22   < > 25 25  GLUCOSE 734*   < > 139* 250*  BUN 11   < > 10 11  CREATININE 1.24*   < > 0.69 0.76  CALCIUM 9.0   < > 8.7* 9.0  MG 1.9  --   --   --    < > = values in this interval not displayed.   Liver Function Tests: No results for input(s): AST, ALT, ALKPHOS, BILITOT, PROT, ALBUMIN in the last 168 hours. No results for input(s): LIPASE, AMYLASE in the last 168 hours. No results for input(s): AMMONIA in the last 168 hours. CBC: Recent Labs  Lab 09/04/20 1331 09/04/20 1613 09/04/20 2207  WBC 16.4*  --  14.0*  HGB 13.7 16.0* 11.4*  HCT 43.1 47.0* 36.3  MCV 77.2*  --  76.6*  PLT 431*  --  342   Cardiac Enzymes: No results for input(s): CKTOTAL, CKMB, CKMBINDEX, TROPONINI in the last 168 hours. BNP: Invalid input(s): POCBNP CBG: Recent Labs  Lab 09/05/20 0552 09/05/20 0806  GLUCAP 168* 238*       Disposition and Follow-up: Discharge Instructions    (HEART FAILURE PATIENTS) Call MD:  Anytime you have any of the following symptoms: 1) 3 pound weight gain in 24 hours or 5 pounds in 1 week 2) shortness of  breath, with or without a dry hacking cough 3) swelling in the hands, feet or stomach 4) if you have to sleep on extra pillows at night in order to breathe.   Complete by: As directed    Diet Carb Modified   Complete by: As directed    Increase activity slowly   Complete by: As directed        DISPOSITION: *Home   DISCHARGE FOLLOW-UP  Follow-up Information    Gifford Shave, MD. Schedule an appointment as soon as possible for a visit in 1 week(s).   Specialty: Family Medicine Why: for hospital follow-up Contact information: [address] [place] [address] [phone]        Larey Dresser, MD Follow up in 2 week(s).   Specialty: Cardiology Contact information: [address] [place] [address] [phone]        Constance Haw, MD .   Specialty: Cardiology Contact information: [place] [address] [phone]                Time coordinating discharge:  35 minutes  Signed:   Estill Cotta M.D. [facility] 09/05/2020, 11:51 AM

## 2020-09-06 ENCOUNTER — Telehealth: Payer: Self-pay

## 2020-09-06 DIAGNOSIS — Z006 Encounter for examination for normal comparison and control in clinical research program: Secondary | ICD-10-CM

## 2020-09-07 ENCOUNTER — Telehealth (HOSPITAL_COMMUNITY): Payer: Self-pay | Admitting: Licensed Clinical Social Worker

## 2020-09-07 ENCOUNTER — Encounter (HOSPITAL_COMMUNITY): Payer: Medicaid Other

## 2020-09-07 NOTE — Telephone Encounter (Signed)
At Home-HF  Telephone Script Day: 4    Date of Phone Call: 09/06/2020                                  Time: 8:00                                                 Subject ID: [id number]        Phone Call Performed:  []  Yes  [x]  No  If No,   [x]  Subject did not respond   []  Subject unavailable   []  Other, specify ___________   The subject did not answer the telephone. A voicemail was left asking for a return call; a return call was not received. The visit was not conducted. Protocol deviation submitted.   Weight and Blood pressure:  1. Did you use the provided scale to weigh yourself this morning? []  Yes  []  No   If Yes, Did you document it in the diary? []  Yes []  No  What was your weight today? _____ lbs.  If No, please ask to the subject to weigh himself/herself now and obtain the measurement. _____ lbs.     2. Did you use the provided "cuff" to take your Blood Pressure and Heart Rate this morning? []  Yes  []  No  If yes, did you document it in the Diary?  []  Yes  []  No    What was your Blood Pressure and Heart Rate today?   Heart Rate: _______           Blood Pressure: ________         Patient Global Assessment Visual Analog Scale (VAS): Please ask if subject completed the Visual Analog Scale in the Diary.  []  Yes []  No If No, Ask to complete now and remind the subject to complete it before next scheduled call or visit.       5-Point Likert Scale: Current Dyspnea Status How much difficulty are you having in breathing now?  []  not short of breath               []  mildly short of breath             []  moderately short of breath             []  severely short of breath                  []  very severely short of breath     7-Point Likert Scale: Patient-Assessed Dyspnea Status Compared to how much difficulty you were having with your breathing just before starting in the study, how is your breathing now?  []  Markedly better       []  Moderately better        []  Minimally better       []  No change       []  Minimally worse       []  Moderately worse       []  Markedly worse       Medications/Changes and Medical Events:  1. How much oral diuretic did you take yesterday? ___________   2. Did you document in the Diary start time and end time for Furoscix Infusor (if applicable)? []  Yes []  No []  N/A  3. Have you had any problems/issues with Infusor? []  Yes []  No []  N/A If yes, please describe: ______________________________________________   4. Since your last visit/call, have you started any new medications or have your medications changed? (review compare to last visit/call)  []  Yes []  No If yes, please specify:________________________________________________   5. Since your last visit/call, have you had any medical events, have you visited a hospital, Emergency Department or seen any other health care provider?  []  Yes []  No If yes, please describe (including treatments):_____________________________

## 2020-09-07 NOTE — Telephone Encounter (Signed)
CSW called pt to see if she needed help with transportation to appt today.  Pt states that after ED stay over the weekend she needs a break and won't be coming in for her appt this morning.  CSW informed research and HF Clinic staff of above.  Will continue to follow and assist as needed  [name], [facility] Desk#: [phone] Cell#: [phone]

## 2020-09-07 NOTE — Progress Notes (Signed)
Subject [id number] was scheduled for Day 3 clinic visit for At Home trial. The subject refused to attend appointment. The visit was not completed. Protocol deviation submitted.

## 2020-09-08 ENCOUNTER — Telehealth: Payer: Self-pay

## 2020-09-08 DIAGNOSIS — Z006 Encounter for examination for normal comparison and control in clinical research program: Secondary | ICD-10-CM

## 2020-09-08 NOTE — Telephone Encounter (Signed)
At Home-HF  Telephone Script Day: 5    Date of Phone Call: 09/08/2020                                    Time: 1345                                                  Subject ID: [id number]        Phone Call Performed:  [x]  Yes  []  No  If No,   []  Subject did not respond   []  Subject unavailable   []  Other, specify ___________     Weight and Blood pressure:  1. Did you use the provided scale to weigh yourself this morning? []  Yes  [x]  No   If Yes, Did you document it in the diary? []  Yes []  No  What was your weight today? _____ lbs.  If No, please ask to the subject to weigh himself/herself now and obtain the measurement. _____ lbs.     2. Did you use the provided "cuff" to take your Blood Pressure and Heart Rate this morning? []  Yes  [x]  No  If yes, did you document it in the Diary?  []  Yes  []  No    What was your Blood Pressure and Heart Rate today?   Heart Rate: _______           Blood Pressure: ________         Patient Global Assessment Visual Analog Scale (VAS): Please ask if subject completed the Visual Analog Scale in the Diary.  [x]  Yes []  No If No, Ask to complete now and remind the subject to complete it before next scheduled call or visit.       5-Point Likert Scale: Current Dyspnea Status How much difficulty are you having in breathing now?  [x]  not short of breath               []  mildly short of breath             []  moderately short of breath             []  severely short of breath                  []  very severely short of breath     7-Point Likert Scale: Patient-Assessed Dyspnea Status Compared to how much difficulty you were having with your breathing just before starting in the study, how is your breathing now?  []  Markedly better       []  Moderately better       []  Minimally better       [x]  No change       []  Minimally worse       []  Moderately worse       []  Markedly worse       Medications/Changes and Medical  Events:  How much oral diuretic did you take yesterday? 100 mg torsemide and 25 mg spironolactone  2. Did you document in the Diary start time and end time for Furoscix Infusor (if applicable)? []  Yes []  No [x]  N/A   3. Have you had any problems/issues with Infusor? []  Yes []  No [x]  N/A If yes, please describe: ______________________________________________   4. Since  your last visit/call, have you started any new medications or have your medications changed? (review compare to last visit/call)  []  Yes [x]  No If yes, please specify: Subject was prescribed insulin aspart protamine- aspart (70-30) but has not started it yet.   5. Since your last visit/call, have you had any medical events, have you visited a hospital, Emergency Department or seen any other health care provider?  []  Yes [x]  No If yes, please describe (including treatments):_____________________________   [employer] Protocol [id number] AT [facility]  Patient Global Assessment Visual Analog Scale (EQ-VAS)        Subject Number:   [id number]  Date Completed:        09/08/2020      Study Day: 5      . We would like to know how good or bad your health is TODAY. Marland Kitchen This scale is numbered 0 to 100. . 100 means the best health you can imagine.       0 means the worst health you can imagine. . Please let me know the number you feel indicated how your health is TODAY.    VAS SCORE - YOUR HEALTH TODAY : 60

## 2020-09-09 ENCOUNTER — Telehealth: Payer: Self-pay

## 2020-09-09 ENCOUNTER — Telehealth: Payer: Self-pay | Admitting: *Deleted

## 2020-09-10 ENCOUNTER — Ambulatory Visit (HOSPITAL_COMMUNITY)
Admission: RE | Admit: 2020-09-10 | Discharge: 2020-09-10 | Disposition: A | Discharge status: Home / Self Care | Payer: Medicaid Other | Source: Ambulatory Visit | Attending: Cardiology | Admitting: Cardiology

## 2020-09-10 ENCOUNTER — Encounter (HOSPITAL_COMMUNITY): Payer: Self-pay

## 2020-09-10 ENCOUNTER — Other Ambulatory Visit: Payer: Self-pay

## 2020-09-10 ENCOUNTER — Other Ambulatory Visit (HOSPITAL_COMMUNITY): Payer: Self-pay | Admitting: Cardiology

## 2020-09-10 ENCOUNTER — Encounter: Payer: Medicaid Other | Admitting: *Deleted

## 2020-09-10 ENCOUNTER — Telehealth: Payer: Self-pay | Admitting: Family Medicine

## 2020-09-10 ENCOUNTER — Encounter: Payer: Self-pay | Admitting: *Deleted

## 2020-09-10 VITALS — BP 163/99 | HR 88 | Wt 193.4 lb

## 2020-09-10 DIAGNOSIS — I513 Intracardiac thrombosis, not elsewhere classified: Secondary | ICD-10-CM | POA: Insufficient documentation

## 2020-09-10 DIAGNOSIS — Z9114 Patient's other noncompliance with medication regimen: Secondary | ICD-10-CM | POA: Insufficient documentation

## 2020-09-10 DIAGNOSIS — I480 Paroxysmal atrial fibrillation: Secondary | ICD-10-CM | POA: Insufficient documentation

## 2020-09-10 DIAGNOSIS — E282 Polycystic ovarian syndrome: Secondary | ICD-10-CM | POA: Insufficient documentation

## 2020-09-10 DIAGNOSIS — Z79899 Other long term (current) drug therapy: Secondary | ICD-10-CM | POA: Diagnosis not present

## 2020-09-10 DIAGNOSIS — Z794 Long term (current) use of insulin: Secondary | ICD-10-CM | POA: Insufficient documentation

## 2020-09-10 DIAGNOSIS — I5020 Unspecified systolic (congestive) heart failure: Secondary | ICD-10-CM | POA: Diagnosis not present

## 2020-09-10 DIAGNOSIS — E1165 Type 2 diabetes mellitus with hyperglycemia: Secondary | ICD-10-CM | POA: Diagnosis not present

## 2020-09-10 DIAGNOSIS — Z596 Low income: Secondary | ICD-10-CM | POA: Diagnosis not present

## 2020-09-10 DIAGNOSIS — Z3202 Encounter for pregnancy test, result negative: Secondary | ICD-10-CM | POA: Insufficient documentation

## 2020-09-10 DIAGNOSIS — Z886 Allergy status to analgesic agent status: Secondary | ICD-10-CM | POA: Diagnosis not present

## 2020-09-10 DIAGNOSIS — N946 Dysmenorrhea, unspecified: Secondary | ICD-10-CM | POA: Diagnosis not present

## 2020-09-10 DIAGNOSIS — I110 Hypertensive heart disease with heart failure: Secondary | ICD-10-CM | POA: Diagnosis not present

## 2020-09-10 DIAGNOSIS — Z7984 Long term (current) use of oral hypoglycemic drugs: Secondary | ICD-10-CM | POA: Insufficient documentation

## 2020-09-10 DIAGNOSIS — Z7901 Long term (current) use of anticoagulants: Secondary | ICD-10-CM | POA: Diagnosis not present

## 2020-09-10 DIAGNOSIS — Z888 Allergy status to other drugs, medicaments and biological substances status: Secondary | ICD-10-CM | POA: Diagnosis not present

## 2020-09-10 DIAGNOSIS — I2510 Atherosclerotic heart disease of native coronary artery without angina pectoris: Secondary | ICD-10-CM | POA: Diagnosis not present

## 2020-09-10 DIAGNOSIS — Z006 Encounter for examination for normal comparison and control in clinical research program: Secondary | ICD-10-CM

## 2020-09-10 DIAGNOSIS — I5022 Chronic systolic (congestive) heart failure: Secondary | ICD-10-CM | POA: Diagnosis not present

## 2020-09-10 LAB — BASIC METABOLIC PANEL
Anion gap: 9 (ref 5–15)
BUN: 5 mg/dL — ABNORMAL LOW (ref 6–20)
CO2: 24 mmol/L (ref 22–32)
Calcium: 9.1 mg/dL (ref 8.9–10.3)
Chloride: 99 mmol/L (ref 98–111)
Creatinine, Ser: 0.71 mg/dL (ref 0.44–1.00)
GFR, Estimated: >60 mL/min (ref >60)
Glucose, Bld: 323 mg/dL — ABNORMAL HIGH (ref 70–99)
Potassium: 3.8 mmol/L (ref 3.5–5.1)
Sodium: 132 mmol/L — ABNORMAL LOW (ref 135–145)

## 2020-09-10 LAB — MAGNESIUM: Magnesium: 1.9 mg/dL (ref 1.7–2.4)

## 2020-09-10 LAB — HCG, QUANTITATIVE, PREGNANCY: hCG, Beta Chain, Quant, S: 1 m[IU]/mL (ref <5)

## 2020-09-10 LAB — DIGOXIN LEVEL: Digoxin Level: <0.2 ng/mL — ABNORMAL LOW (ref 0.8–2.0)

## 2020-09-10 MED ORDER — BIDIL 20-37.5 MG PO TABS: 1.0000 | ORAL_TABLET | Freq: Three times a day (TID) | ORAL | 3 refills | Status: DC

## 2020-09-10 MED FILL — BIDIL 20-37.5 MG TABS: 20-37.5 | 30 days supply | Qty: 90 | Fill #0

## 2020-09-10 NOTE — Telephone Encounter (Signed)
   DOMINGA MCDUFFIE DOB: 1975/11/24 MRN: [id number]   RIDER WAIVER AND RELEASE OF LIABILITY  For purposes of improving physical access to our facilities, [facility] is pleased to partner with third parties to provide [facility] patients or other authorized individuals the option of convenient, on-demand ground transportation services (the Technical brewer") through use of the technology service that enables users to request on-demand ground transportation from independent third-party providers.  By opting to use and accept these [employer], I, the undersigned, hereby agree on behalf of myself, and on behalf of any minor child using the [employer] for whom I am the parent or legal guardian, as follows:  1. Government social research officer provided to me are provided by independent third-party transportation providers who are not [employer] or employees and who are unaffiliated with [employer]. 2. [facility] is neither a transportation carrier nor a common or public carrier. 3. [facility] has no control over the quality or safety of the transportation that occurs as a result of the [employer]. 4. [facility] cannot guarantee that any third-party transportation provider will complete any arranged transportation service. 5. [facility] makes no representation, warranty, or guarantee regarding the reliability, timeliness, quality, safety, suitability, or availability of any of the Transport Services or that they will be error free. 6. I fully understand that traveling by vehicle involves risks and dangers of serious bodily injury, including permanent disability, paralysis, and death. I agree, on behalf of myself and on behalf of any minor child using the Transport Services for whom I am the parent or legal guardian, that the entire risk arising out of my use of the [employer] remains solely with me, to the maximum extent permitted under applicable law. 7. The [employer] are provided "as is" and "as available." [facility] disclaims all representations and warranties, express, implied or statutory, not expressly set out in these terms, including the implied warranties of merchantability and fitness for a particular purpose. 8. I hereby waive and release [facility], its agents, employees, officers, directors, representatives, insurers, attorneys, assigns, successors, subsidiaries, and affiliates from any and all past, present, or future claims, demands, liabilities, actions, causes of action, or suits of any kind directly or indirectly arising from acceptance and use of the [employer]. 9. I further waive and release [facility] and its affiliates from all present and future liability and responsibility for any injury or death to persons or damages to property caused by or related to the use of the [employer]. 10. I have read this Waiver and Release of Liability, and I understand the terms used in it and their legal significance. This Waiver is freely and voluntarily given with the understanding that my right (as well as the right of any minor child for whom I am the parent or legal guardian using the [employer]) to legal recourse against [facility] in connection with the [employer] is knowingly surrendered in return for use of these services.   I attest that I read the consent document to [name], gave Ms. Lippens the opportunity to ask questions and answered the questions asked (if any). I affirm that [name] then provided consent for she's participation in this program.     Katy Apo

## 2020-09-10 NOTE — Progress Notes (Addendum)
Advanced Heart Failure Clinic Note    PCP: Gifford Shave, MD PCP-Cardiologist: Dr. Aundra Dubin  EP- Dr. Curt Bears   HPI: 45 y.o. with history of type 2 diabetes (poorly controlled, last hgbA1c > 15), poorly controlled HTN, hyperlipidemia, paroxysmal atrial fibrillation, and chronic systolic CHF of uncertain etiology.  Patient had an echo in 6/20 as outpatient due to exertional dyspnea. This showed EF 20-25%, moderate RV dysfunction.  She was also noted to have paroxysmal atrial fibrillation.  She was started on Xarelto.  In 7/20, she was admitted with DKA. In 8/20, she was admitted with CHF exacerbation and diuresed.  She was sent home on Lasix 40 mg po bid.   Readmitted on 9/20 with CHF exacerbation, in the setting of elevated BP.  Diuresed with IV lasix.  LHC showed nonobstructive CAD.  RHC showed R>L heart failure with low PAPI suggesting significant RV dysfunction. She was transitioned to PO torsemide and treated w/ guidelines directed medical therapy w/ Entresto, Spironolactone, Coreg and digoxin. She was also transitioned off of Xarelto and placed on Coumadin.  Readmitted 0/08/4095 with A/C systolic heart failure. She had not been taking her lasix because she says it was on her AVS to stop it.  Diuresed with IV lasix and transitioned to torsemide 40 mg daily. Weight day before discharge was 190 pounds. Echo with LV EF <20%, RV severely decreased systolic function.   Echo in 6/21 showed EF 35%, moderately decreased RV systolic function, moderate LAE, mild MR.  Echo was done today and reviewed, EF 35-40%, global hypokinesis, mild LVH, normal RV, IVC normal.   Recently seen by Dr. Aundra Dubin 11/2 and was fluid overloaded. Wt was up 7 lb. ReDs clip elevated at 40%. She was symptomatic, NYHA Class III. Torsemide was increased to 60 mg daily. She was also in sinus tach but already on max dose of Coreg.  Ivabradine 5 mg bid was added. She was also ordered to get a cMRI to look for scar/infiltrative disease  and quantify EF. This has not been done yet.   F/u on 06/16/20. Wt only down 2 lb. ReDs Clip still elevated but down slightly at 38%. EKG shows improved HR. In NSR. HR 84 bpm. BP elevated at 150/100 but she has not yet taken AM meds. Concern that she may have a yeast infection. Notes vaginal irritation/ itching but no dysuria. Has had yeast infections in the past. Also concern that she may be pregnant. She is 7 days late w/ her period. Not using regular birth control.   Seen in clinic last week on 2/11 and was fluid overloaded.  Enrolled in HF SQ lasix trial and given dose of SQ lasix in clinic and sent home w/ 80 mg SQ lasix. BMP was obtained and showed severe hyperglycemia > 600. Referred to the ED and was admitted by [facility] for DKA. Placed on IV fluid hydration, IV insulin drip.  Torsemide was held. Once gap was closed, no nausea or vomiting, CBGs stable, patient was transitioned to subcutaneous insulin. Her hgb A1c was 13.6. She was placed on NovoLog 70/30 15 units twice a day and instructed to c/w Jardiance.   She returns to clinic today for f/u. Reports she stopped taking all of her meds, in fear that she may be pregnant, ~2 week per her estimate. No regular use of contraception. Of note, she had 3 negative beta HCG test last week during admission. Urine pregnancy test done in clinic today and also negative. She is convinced that she is pregnant.  BP elevated today at 163/99.  ReDs Clip 31%. Denies resting dyspnea but chronically NYHA Class III. No peripheral edema on exam. Urinating frequently. Denies any recent yeast infections.    Labs (5/21): hgbA1c 9.3, BNP 76, K 3.9, creatinine 1.1 Labs (6/21): digoxin 0.4, K 4.1, creatinine 0.77 Labs (10/21): BNP 25, K 4.1, creatinine 0.91 Labs (11/21): K 3.7, creatinine 0.6, digoxin <0.2 Labs (12/21): K 4.2, creatinine 0.83 Labs (07/28/2020): K 3.9 Creatinine 0/7  Labs ( 09/03/20): K 3.8 Creatinine 0.5 Glucose 428   PMH: 1. Type 2 diabetes: H/o DKA 2.  HTN 3. OSA 4. Obesity 5. Polycystic ovarian syndrome 6. Chronic systolic CHF: Nonischemic cardiomyopathy - Echo (6/20) with EF 20-25%, moderate RV dysfunction.  - LHC/RHC (9/20): No significant CAD.  - Echo (3/21): EF <20%, moderate LVH with global HK, severely reduced RV systolic function.  - Echo (6/21): EF 35%, moderately decreased RV systolic function, moderate LAE, mild MR. - Echo (11/21): EF 35-40%, global hypokinesis, mild LVH, normal RV, IVC normal.  7. Hyperlipidemia 8. Atrial fibrillation: Paroxysmal.  9. History of LV thrombus.    Current Outpatient Medications  Medication Sig Dispense Refill  . Accu-Chek Softclix Lancets lancets Use as instructed 100 each 12  . albuterol (VENTOLIN HFA) 108 (90 Base) MCG/ACT inhaler Inhale 2 puffs into the lungs every 6 (six) hours as needed for wheezing or shortness of breath. 18 g 1  . atorvastatin (LIPITOR) 40 MG tablet TAKE 1 TABLET BY MOUTH DAILY AT 6 PM. 30 tablet 2  . blood glucose meter kit and supplies KIT Dispense based on patient and insurance preference. Use up to four times daily as directed. (FOR ICD-9 250.00, 250.01). 1 each 0  . Blood Glucose Monitoring Suppl (ACCU-CHEK AVIVA PLUS) w/Device KIT 1 application by Does not apply route QID. Check fasting sugars as well as throughout the day 1 kit 0  . busPIRone (BUSPAR) 7.5 MG tablet Take 7.5 mg by mouth 2 (two) times daily.    . carvedilol (COREG) 25 MG tablet Take 1 tablet (25 mg total) by mouth 2 (two) times daily with a meal. 180 tablet 3  . digoxin (LANOXIN) 0.125 MG tablet Take 1 tablet (0.125 mg total) by mouth daily. 30 tablet 3  . empagliflozin (JARDIANCE) 25 MG TABS tablet Take 25 mg by mouth daily. 90 tablet 3  . gabapentin (NEURONTIN) 100 MG capsule Take 100 mg by mouth 2 (two) times daily.    Marland Kitchen glucose blood (ACCU-CHEK AVIVA PLUS) test strip Check CBG 2 times per day 100 each 12  . glucose blood (AGAMATRIX PRESTO TEST) test strip Use as instructed 100 each 12  .  hydrOXYzine (ATARAX/VISTARIL) 10 MG tablet TAKE 1 TABLET (10 MG TOTAL) BY MOUTH 3 (THREE) TIMES DAILY AS NEEDED. 30 tablet 0  . insulin aspart protamine- aspart (NOVOLOG MIX 70/30) (70-30) 100 UNIT/ML injection Inject 0.15 mLs (15 Units total) into the skin 2 (two) times daily with a meal. 10 mL 11  . Insulin Syringe-Needle U-100 (INSULIN SYRINGE .3CC/31GX5/16") 31G X 5/16" 0.3 ML MISC 15 Units by Does not apply route 2 (two) times daily. 100 each 3  . isosorbide-hydrALAZINE (BIDIL) 20-37.5 MG tablet Take 0.5 tablets by mouth 3 (three) times daily. 90 tablet 3  . ivabradine (CORLANOR) 5 MG TABS tablet Take 1 tablet (5 mg total) by mouth 2 (two) times daily with a meal. 60 tablet 3  . Lancets Misc. (ACCU-CHEK SOFTCLIX LANCET DEV) KIT 1 application by Does not apply route 2 (two)  times daily. 1 kit 0  . meclizine (ANTIVERT) 12.5 MG tablet Take 1 tablet (12.5 mg total) by mouth 3 (three) times daily as needed for dizziness. 30 tablet 0  . potassium chloride SA (KLOR-CON) 20 MEQ tablet Take 20 mEq by mouth See admin instructions. Take 1 tablet by mouth every Monday Wednesday and Friday    . sacubitril-valsartan (ENTRESTO) 97-103 MG Take 1 tablet by mouth 2 (two) times daily. 180 tablet 3  . sertraline (ZOLOFT) 25 MG tablet Take 1 tablet (25 mg total) by mouth daily. 30 tablet 3  . spironolactone (ALDACTONE) 25 MG tablet Take 25 mg by mouth daily.    Marland Kitchen torsemide (DEMADEX) 20 MG tablet Take 5 tablets (100 mg total) by mouth daily.    . traZODone (DESYREL) 50 MG tablet TAKE 1 TABLET (50 MG TOTAL) BY MOUTH AT BEDTIME AS NEEDED FOR SLEEP. 30 tablet 0  . warfarin (COUMADIN) 7.5 MG tablet Take as directed by Coumadin Clinic (Patient taking differently: Take 15 mg by mouth daily.) 45 tablet 0   No current facility-administered medications for this encounter.    Allergies  Allergen Reactions  . Shellfish Allergy Swelling    Airway involvement including EMS - Has Epi-Pen  . Dulaglutide Nausea And Vomiting  and Other (See Comments)    Multiple episodes of vomiting over multiple days  . Metformin And Related Diarrhea  . Ibuprofen     Per PCP interferes with daily meds       Social History   Socioeconomic History  . Marital status: Single    Spouse name: Not on file  . Number of children: Not on file  . Years of education: Not on file  . Highest education level: Not on file  Occupational History  . Not on file  Tobacco Use  . Smoking status: Never Smoker  . Smokeless tobacco: Never Used  Vaping Use  . Vaping Use: Never used  Substance and Sexual Activity  . Alcohol use: No    Alcohol/week: 0.0 standard drinks  . Drug use: No  . Sexual activity: Yes  Other Topics Concern  . Not on file  Social History Narrative  . Not on file   Social Determinants of Health   Financial Resource Strain: High Risk  . Difficulty of Paying Living Expenses: Hard  Food Insecurity: No Food Insecurity  . Worried About Charity fundraiser in the Last Year: Never true  . Ran Out of Food in the Last Year: Never true  Transportation Needs: No Transportation Needs  . Lack of Transportation (Medical): No  . Lack of Transportation (Non-Medical): No  Physical Activity: Insufficiently Active  . Days of Exercise per Week: 7 days  . Minutes of Exercise per Session: 20 min  Stress: Stress Concern Present  . Feeling of Stress : Very much  Social Connections: Not on file  Intimate Partner Violence: Not At Risk  . Fear of Current or Ex-Partner: No  . Emotionally Abused: No  . Physically Abused: No  . Sexually Abused: No      Family History  Problem Relation Age of Onset  . Diabetes Mother   . Hypertension Mother   . Hypertension Father   . Diabetes Father     Vitals:   09/10/20 1012  BP: (!) 163/99  Pulse: 88  SpO2: (!) 88%  Weight: 87.7 kg (193 lb 6.4 oz)    Wt Readings from Last 3 Encounters:  09/10/20 87.7 kg (193 lb 6.4 oz)  09/04/20  85.9 kg (189 lb 6.4 oz)  09/03/20 88.2 kg (194 lb  6.4 oz)     PHYSICAL EXAM: ReDs clip 31%  General:  Well appearing. No respiratory difficulty HEENT: normal Neck: supple. no JVD. Carotids 2+ bilat; no bruits. No lymphadenopathy or thyromegaly appreciated. Cor: PMI nondisplaced. Regular rate & rhythm. No rubs, gallops or murmurs. Lungs: clear Abdomen: obese, soft, nontender, nondistended. No hepatosplenomegaly. No bruits or masses. Good bowel sounds. Extremities: no cyanosis, clubbing, rash, edema Neuro: alert & oriented x 3, cranial nerves grossly intact. moves all 4 extremities w/o difficulty. Affect pleasant.   EKG: not performed   ASSESSMENT & PLAN:  1.Chronic systolic CHF:  Nonischemic cardiomyopathy, diagnosed in 6/20. Repeat Echo 03/2019 showed EF 20-25%, diffuse hypokinesis, LV thrombus, moderate RV systolic dysfunction, mild MR, moderate TR. Echo in 3/21 with EF <30%, severe RV dysfunction. She has a history of paroxysmal atrial fibrillation but seems to be mostly in sinus rhythm. This is not likely to be a tachycardia-mediated car%.diomyopathy. Poorly controlled diabetes itself can cause a cardiomyopathy, as can poorly controlled HTN. Myocarditis is a potential cause of her cardiomyopathy. [place] 9/20 showed nonobstructive CAD. RHC showed R>L heart failure with low PAPI suggesting significant RV dysfunction.  Echo in 6/21 with EF up to 35%. Echo 11/21 EF 35-40% Gard[place] I DD - NYHA III, chronic.  Reds Clip 31%  - Urine pregnancy test checked in clinic today and negative. Will check  HCG. If negative, will continue current regimen. If + will need to stop fetotoxic meds.  If negative pregnancy test, continue the following  - Continue Entresto 97/103 bid.  - Continue carvedilol 25 mg bid. - Continue ivabradine 5 mg bid.  - Continue spironolactone 25 mg daily.  - Continue digoxin. Check dig level  - Continue empagliflozin.  - Continue Bidil 1/2 tab tid - She previously had been trying to get pregnant (has been difficult with  polycystic ovarian syndrome). She has 2 grown children already.With her cardiomyopathy and active CHF, pregnancy would create a significant mortality risk for her. A number of her cardiac meds to control her CHF are feto-toxic.  - discussed importance of strict regular birth control/ use of condoms to avoid pregnancy.   2. LV thrombus - LV thrombus noted on echo 9/20.  - Compliant w/ coumadin. No bleeding issues.  - INRs followed by [facility]  - if + pregnancy would need to stop coumadin and switch to another agent   3. Atrial fibrillation, Paroxysmal.  - Continue warfarin (using warfarin rather than DOAC with h/o LV thrombus).  - Would consider atrial fibrillation ablation in the future, if recurrence, given [facility]-HF data.   4. Type 2 diabetes  - poorly controlled. Recent admit 2/21 for DKA w/ Hgb A1c ~13 - outpatient regimen adjusted. Now on Novolog 70/30 15 units a day + Jardiance   - she denies any recent GU infections w/ Jardiance   5. Hypertension  - elevated today in the setting of skipped meds (pt holding potential fetotoxic meds in fear that she may be pregnant) - check serum pregnancy test, if negative will plan to resume Entresto and spiro - in the meantime, increase Bidil to 1 tablet tid   F/u in 1 week w/ APP     Lyda Jester, PA-C 09/10/20

## 2020-09-10 NOTE — Research (Signed)
Subject: 16-008 Day 7  Composite Congestion Score (CCS) Indicate the subject's current CCS Score at the present time (select for each signs/symptoms) Was CCS Performed:   ?  [x] Yes ?  [] No (Complete Protocol Deviation)     Signs/Symptoms 0 - None  1 -Seldom 2 -Frequent 3 -Continuous  Dyspnoea []  []  [x]  []   Orthopnoea []  []  [x]  []   Fatigue []  []  [x]  []    Signs/Symptoms 0 - <=6  1 - 6-9 2 - 10-15 3 - >=15  JVD (cm H2O) [x]  []  []  []    Signs/Symptoms 0 -None  1 -Bases 2 -To <50% 3 -To >50%  Rales [x]  []  []  []    Signs/Symptoms 0 -Absent/ trace  1 -Slight 2 -Moderate 3 -Marked  Oedema [x]  []  []  []                   6 Minute Walk Was the 6 minute walk performed:    ?[x]  Yes ?  [] No (Complete Protocol Deviation)  Vitals Before 6 Minute Walk Systolic Blood Pressure (mmHg) Diastolic Blood Pressure (mmHg) Heart Rate (BPM) Respiration Rate (BPM)   174 107 98 22  Vitals After 6 Minute Walk Systolic Blood Pressure (mmHg) Diastolic Blood Pressure (mmHg) Heart Rate (BPM) Respiration Rate (BPM)   150 96 99 22  Measured Before 6 Minute Walk Borg Scale Level of Shortness of breath ____0.5_  Borg Scale Level of Fatigue __1___   Measured After 6 Minute Walk Borg Scale Level of Shortness of breath _3____  Borg Scale Level of Fatigue __3___   Distance covered in 6 minutes  ___137____: meters    Did the subject stop or pause before 6 minutes  ? [] Yes (Complete Below)  [x] No   Reason for stopping Reason Response If Yes, Comments   Shortness of breath (HF-related) ? Yes[]   ? No[]     Light headedness (HF-related) ? Yes[]   ? No[]     Fatigue (HF-related) ? Yes[]   ? No[]     Other HF-related symptom ? Yes[]   ? No[]     Non-HF symptom(s) ? Yes[]   ? No[]      [employer] Protocol [id number] AT [facility]  Patient Global Assessment Visual Analog Scale (EQ-VAS)      . We would like to know how good or bad your health is TODAY. Marland Kitchen This scale is  numbered 0 to 100. . 100 means the best health you can imagine.       0 means the worst health you can imagine. . Please let me know the number you feel indicated how your health is TODAY.    VAS SCORE - YOUR HEALTH TODAY : 60       AT [facility] Cardiopulmonary Examination & Physical Examination Summary    Cardiopulmonary Exam  Was the CP Exam Performed? [x]  Yes (Complete Below) []  No (Complete Protocol Deviation)      Heart Sounds  S3: [x]  Absent []  Present   S4: [x]  Absent []  Present  Murmur  Murmur: []  Yes [x]  No   If Yes:  []  Systolic []  Diastolic   If yes select grade: []  I/VI []  II/VI []  III/VI []  IV/VI []  V/VI []  VI/VI   Chest Sounds  Rales: []  Yes [x]  No   If yes check one: []  ? 1/3 of lung fields full []  1/2 of lung fields full []  > 1/2 of lung fields full  If yes check one: []  Left []  Right []  Bilateral  Edema   Edema (pedal): [x]  Absent []  Trace []  1+ (slight) []  2+ (moderate) []  3+ (severe)    Hepatomegaly  Hepatomegaly: [x]  None []  < 2 cm []  2-4 cm []  > 4 cm  Peripheral Pulses  Peripheral Pulses: []  Normal []  Abnormal   If Abnormal, explain:             Physical Exam   Was the physical Exam Performed?  [x]  Yes        []  No (complete protocol deviation)    . Lungs/Chest?     [x]  Normal        []  Abnormal        []  Not Done  If abnormal, was it clinically significant?     []  Yes   []  No       . Dermatologic (Abdominal Area):   [x]  Normal        []  Abnormal        []  Not Done   If abnormal, was it clinically significant?  []  Yes   []  No       . Periphery:     [x]  Normal          []  Abnormal         []  Not Done   If abnormal, was it clinically significant?  []  Yes    []  No      . Other Body System Examined?  []  Yes        [x]  No          New York Heart Association (NYHA) Heart Failure Classification Indicate the subject's current NYHA Classification at the present time  (check only one) Was the NYHA Performed:    [x]  Yes    [] No (Complete Protocol Deviation)     []   Class I:  Patients have cardiac disease but without the resulting limitations of physical activity. Ordinary physical activity does not cause undue fatigue, palpitation, dyspnea or anginal pain.     []   Class II:    Patients have cardiac disease resulting in slight limitation of physical activity. They are comfortable at rest. Ordinary physical activity results in fatigue, palpitation, dyspnea or anginal pain.     [x]   Class III:  Patients have cardiac disease resulting in marked limitation of physical activity. They are comfortable at rest. Less than ordinary physical activity causes fatigue, palpitation, dyspnea or anginal pain.     []   Class IV: Patients have cardiac disease resulting in inability to carry on any physical activity without discomfort. Symptoms of cardiac insufficiency or of the anginal syndrome may be present even at rest. If any physical activity is undertaken, discomfort is increased.

## 2020-09-10 NOTE — Patient Instructions (Signed)
INCREASE Bidil to one tab three times daily  Labs today We will only contact you if something comes back abnormal or we need to make some changes. Otherwise no news is good news!  Your physician recommends that you schedule a follow-up appointment in: 1 week  in the Advanced Practitioners (PA/NP) [facility], you and your health needs are our priority. As part of our continuing mission to provide you with exceptional heart care, we have created designated Provider Care Teams. These Care Teams include your primary Cardiologist (physician) and Advanced Practice Providers (APPs- Physician Assistants and Nurse Practitioners) who all work together to provide you with the care you need, when you need it.   You may see any of the following providers on your designated Care Team at your next follow up: Marland Kitchen Dr Glori Bickers . Dr Loralie Champagne . Darrick Grinder, NP . Lyda Jester, [place] . [name], PharmD   Please be sure to bring in all your medications bottles to every appointment.

## 2020-09-14 ENCOUNTER — Telehealth: Payer: Self-pay | Admitting: Pharmacist

## 2020-09-14 NOTE — Telephone Encounter (Signed)
Patient called, had INR appt scheduled for Wednesday 2/23 but has not been taking warfarin because she says she is 99% sure she is pregnant and does not want to hurt her fetus.  Of note, patient had in office pregnancy test at CHF clinic on 2/17 and it was negative. Patient does not believe that test because she thinks it was done too soon so she is going to take a home test.  Requests appt on 2/23 be canceled and she will call back with results of home test.  Asked patient if she had an OB/GYN which she says she does but has not scheduled with them yet.

## 2020-09-16 ENCOUNTER — Encounter (HOSPITAL_COMMUNITY): Payer: Self-pay | Admitting: Cardiology

## 2020-09-18 ENCOUNTER — Encounter (HOSPITAL_COMMUNITY): Payer: Medicaid Other

## 2020-09-18 ENCOUNTER — Telehealth (HOSPITAL_COMMUNITY): Payer: Medicaid Other | Admitting: Psychiatry

## 2020-09-20 ENCOUNTER — Telehealth: Payer: Self-pay | Admitting: *Deleted

## 2020-09-23 ENCOUNTER — Telehealth (HOSPITAL_COMMUNITY): Payer: Self-pay

## 2020-09-23 NOTE — Telephone Encounter (Signed)
I attempted to touch base with Ms Wadsworth to see how she has been doing. She did not answer so I left a message requesting she call me back.   [name], EMT 09/23/20

## 2020-09-30 ENCOUNTER — Telehealth: Payer: Self-pay | Admitting: *Deleted

## 2020-09-30 NOTE — Telephone Encounter (Signed)
Called pt since it has been awhile since she had her INR checked in the Anticoagulation Clinic and last communication was she had stopped her Warfarin. Left a message for her to call back to follow up. Will await a return call. From the pt.

## 2020-10-01 ENCOUNTER — Other Ambulatory Visit: Payer: Self-pay

## 2020-10-01 ENCOUNTER — Telehealth (HOSPITAL_COMMUNITY): Payer: Self-pay | Admitting: Pharmacy Technician

## 2020-10-01 ENCOUNTER — Ambulatory Visit (HOSPITAL_COMMUNITY)
Admission: RE | Admit: 2020-10-01 | Discharge: 2020-10-01 | Disposition: A | Discharge status: Home / Self Care | Payer: Medicaid Other | Source: Ambulatory Visit | Attending: Family Medicine | Admitting: Family Medicine

## 2020-10-01 VITALS — BP 212/114 | HR 95 | Wt 189.4 lb

## 2020-10-01 DIAGNOSIS — Z886 Allergy status to analgesic agent status: Secondary | ICD-10-CM | POA: Diagnosis not present

## 2020-10-01 DIAGNOSIS — Z8249 Family history of ischemic heart disease and other diseases of the circulatory system: Secondary | ICD-10-CM | POA: Insufficient documentation

## 2020-10-01 DIAGNOSIS — I428 Other cardiomyopathies: Secondary | ICD-10-CM | POA: Diagnosis not present

## 2020-10-01 DIAGNOSIS — E282 Polycystic ovarian syndrome: Secondary | ICD-10-CM | POA: Diagnosis not present

## 2020-10-01 DIAGNOSIS — I5022 Chronic systolic (congestive) heart failure: Secondary | ICD-10-CM | POA: Insufficient documentation

## 2020-10-01 DIAGNOSIS — I160 Hypertensive urgency: Secondary | ICD-10-CM | POA: Diagnosis not present

## 2020-10-01 DIAGNOSIS — Z7984 Long term (current) use of oral hypoglycemic drugs: Secondary | ICD-10-CM | POA: Insufficient documentation

## 2020-10-01 DIAGNOSIS — Z888 Allergy status to other drugs, medicaments and biological substances status: Secondary | ICD-10-CM | POA: Diagnosis not present

## 2020-10-01 DIAGNOSIS — I110 Hypertensive heart disease with heart failure: Secondary | ICD-10-CM | POA: Diagnosis not present

## 2020-10-01 DIAGNOSIS — Z006 Encounter for examination for normal comparison and control in clinical research program: Secondary | ICD-10-CM

## 2020-10-01 DIAGNOSIS — Z79899 Other long term (current) drug therapy: Secondary | ICD-10-CM | POA: Diagnosis not present

## 2020-10-01 DIAGNOSIS — E785 Hyperlipidemia, unspecified: Secondary | ICD-10-CM | POA: Diagnosis not present

## 2020-10-01 DIAGNOSIS — I513 Intracardiac thrombosis, not elsewhere classified: Secondary | ICD-10-CM | POA: Diagnosis not present

## 2020-10-01 DIAGNOSIS — I480 Paroxysmal atrial fibrillation: Secondary | ICD-10-CM | POA: Insufficient documentation

## 2020-10-01 DIAGNOSIS — Z9114 Patient's other noncompliance with medication regimen: Secondary | ICD-10-CM | POA: Diagnosis not present

## 2020-10-01 DIAGNOSIS — E1165 Type 2 diabetes mellitus with hyperglycemia: Secondary | ICD-10-CM | POA: Insufficient documentation

## 2020-10-01 DIAGNOSIS — Z7901 Long term (current) use of anticoagulants: Secondary | ICD-10-CM | POA: Diagnosis not present

## 2020-10-01 DIAGNOSIS — Z794 Long term (current) use of insulin: Secondary | ICD-10-CM | POA: Diagnosis not present

## 2020-10-01 DIAGNOSIS — Z596 Low income: Secondary | ICD-10-CM | POA: Diagnosis not present

## 2020-10-01 LAB — BASIC METABOLIC PANEL
Anion gap: 8 (ref 5–15)
BUN: 8 mg/dL (ref 6–20)
CO2: 25 mmol/L (ref 22–32)
Calcium: 9 mg/dL (ref 8.9–10.3)
Chloride: 100 mmol/L (ref 98–111)
Creatinine, Ser: 0.7 mg/dL (ref 0.44–1.00)
GFR, Estimated: >60 mL/min (ref >60)
Glucose, Bld: 349 mg/dL — ABNORMAL HIGH (ref 70–99)
Potassium: 3.8 mmol/L (ref 3.5–5.1)
Sodium: 133 mmol/L — ABNORMAL LOW (ref 135–145)

## 2020-10-01 LAB — MAGNESIUM: Magnesium: 1.8 mg/dL (ref 1.7–2.4)

## 2020-10-01 NOTE — Research (Signed)
            Composite Congestion Score (CCS) Indicate the subject's current CCS Score at the present time (select for each signs/symptoms) Was CCS Performed:   ?  [x] Yes ?  [] No (Complete Protocol Deviation)     Signs/Symptoms 0 - None  1 -Seldom 2 -Frequent 3 -Continuous  Dyspnoea []  []  [x]  []   Orthopnoea []  []  [x]  []   Fatigue []  []  [x]  []    Signs/Symptoms 0 - <=6  1 - 6-9 2 - 10-15 3 - >=15  JVD (cm H2O) [x]  []  []  []    Signs/Symptoms 0 -None  1 -Bases 2 -To <50% 3 -To >50%  Rales [x]  []  []  []    Signs/Symptoms 0 -Absent/ trace  1 -Slight 2 -Moderate 3 -Marked  Oedema [x]  []  []  []

## 2020-10-01 NOTE — Research (Signed)
                6 Minute Walk Was the 6 minute walk performed:    ?[]  Yes ?  [x] No (Complete Protocol Deviation)  Vitals Before 6 Minute Walk Systolic Blood Pressure (mmHg) Diastolic Blood Pressure (mmHg) Heart Rate (BPM) Respiration Rate (BPM)        Vitals After 6 Minute Walk Systolic Blood Pressure (mmHg) Diastolic Blood Pressure (mmHg) Heart Rate (BPM) Respiration Rate (BPM)        Measured Before 6 Minute Walk Borg Scale Level of Shortness of breath _____  Borg Scale Level of Fatigue _____   Measured After 6 Minute Walk Borg Scale Level of Shortness of breath _____  Borg Scale Level of Fatigue _____   Distance covered in 6 minutes ??? _______: meters    Did the subject stop or pause before 6 minutes  ? [] Yes (Complete Below) ?  [] No   Reason for stopping Reason Response If Yes, Comments   Shortness of breath (HF-related) ? Yes[]   ? No[]     Light headedness (HF-related) ? Yes[]   ? No[]     Fatigue (HF-related) ? Yes[]   ? No[]     Other HF-related symptom ? Yes[]   ? No[]     Non-HF symptom(s) ? Yes[]   ? No[]

## 2020-10-01 NOTE — Research (Signed)
     AT [facility] Cardiopulmonary Examination & Physical Examination Summary    Cardiopulmonary Exam  Was the CP Exam Performed? [x]  Yes (Complete Below) []  No (Complete Protocol Deviation)      Heart Sounds  S3: [x]  Absent []  Present   S4: [x]  Absent []  Present  Murmur  Murmur: []  Yes [x]  No   If Yes:  []  Systolic []  Diastolic   If yes select grade: []  I/VI []  II/VI []  III/VI []  IV/VI []  V/VI []  VI/VI   Chest Sounds  Rales: []  Yes [x]  No   If yes check one: []  ? 1/3 of lung fields full []  1/2 of lung fields full []  > 1/2 of lung fields full  If yes check one: []  Left []  Right []  Bilateral    Edema   Edema (pedal): [x]  Absent []  Trace []  1+ (slight) []  2+ (moderate) []  3+ (severe)    Hepatomegaly  Hepatomegaly: [x]  None []  < 2 cm []  2-4 cm []  > 4 cm  Peripheral Pulses  Peripheral Pulses: [x]  Normal []  Abnormal   If Abnormal, explain:             Physical Exam   Was the physical Exam Performed?  [x]  Yes        []  No (complete protocol deviation)    . Lungs/Chest?     [x]  Normal        []  Abnormal        []  Not Done  If abnormal, was it clinically significant?     []  Yes   []  No       . Dermatologic (Abdominal Area):   [x]  Normal        []  Abnormal        []  Not Done   If abnormal, was it clinically significant?  []  Yes   []  No       . Periphery:     [x]  Normal          []  Abnormal         []  Not Done   If abnormal, was it clinically significant?  []  Yes    []  No      . Other Body System Examined?  []  Yes        [x]  No          New York Heart Association (NYHA) Heart Failure Classification Indicate the subject's current NYHA Classification at the present time (check only one) Was the NYHA Performed:    ?[]  Yes  ?  [] No (Complete Protocol Deviation)     []   Class I:  Patients have cardiac disease but without the resulting limitations of physical activity. Ordinary physical activity does not  cause undue fatigue, palpitation, dyspnea or anginal pain.     []   Class II:    Patients have cardiac disease resulting in slight limitation of physical activity. They are comfortable at rest. Ordinary physical activity results in fatigue, palpitation, dyspnea or anginal pain.     [x]   Class III:  Patients have cardiac disease resulting in marked limitation of physical activity. They are comfortable at rest. Less than ordinary physical activity causes fatigue, palpitation, dyspnea or anginal pain.     []   Class IV: Patients have cardiac disease resulting in inability to carry on any physical activity without discomfort. Symptoms of cardiac insufficiency or of the anginal syndrome may be present even at rest. If any physical activity is undertaken, discomfort is increased.

## 2020-10-01 NOTE — Telephone Encounter (Signed)
Called subject for At-Home Trial Day 17 telephone visit. Unable to get in contact with subject. Left VM requesting return call.

## 2020-10-01 NOTE — Research (Signed)
  [employer] Protocol [id number] AT [facility]  Patient Global Assessment Visual Analog Scale (EQ-VAS)        Subject Number:  16-008  Date Completed:       10/01/2020  Study Day:   DAY 30       . We would like to know how good or bad your health is TODAY. Marland Kitchen This scale is numbered 0 to 100. . 100 means the best health you can imagine.       0 means the worst health you can imagine. . Please let me know the number you feel indicated how your health is TODAY.    VAS SCORE - YOUR HEALTH TODAY : 50

## 2020-10-01 NOTE — Telephone Encounter (Signed)
Received a notification that it is time to renew patient assistance for [facility]. Patient has Medicaid. Will submit a PA and once approved, will send the RX to the patient's preferred pharmacy.  Will not seek re-enrollment at this time.

## 2020-10-01 NOTE — Patient Instructions (Signed)
Labs today We will only contact you if something comes back abnormal or we need to make some changes. Otherwise no news is good news!   PLEASE TAKE ALL HEART MEDICATIONS WHEN YOU GET HOME.   Your physician recommends that you schedule a follow-up appointment in: 2-3 [place]  Please call office at [phone] option [phone] if you have any questions or concerns.    At the [facility], you and your health needs are our priority. As part of our continuing mission to provide you with exceptional heart care, we have created designated Provider Care Teams. These Care Teams include your primary Cardiologist (physician) and Advanced Practice Providers (APPs- Physician Assistants and Nurse Practitioners) who all work together to provide you with the care you need, when you need it.   You may see any of the following providers on your designated Care Team at your next follow up: Marland Kitchen Dr Glori Bickers . Dr Loralie Champagne . Dr Vickki Muff . Darrick Grinder, NP . Lyda Jester, [place] . [name], PharmD   Please be sure to bring in all your medications bottles to every appointment.

## 2020-10-01 NOTE — Progress Notes (Signed)
Advanced Heart Failure Clinic Note    PCP: Gifford Shave, MD PCP-Cardiologist: Dr. Aundra Dubin  EP- Dr. Curt Bears   HPI: 45 y.o. with history of type 2 diabetes (poorly controlled, last hgbA1c > 15), poorly controlled HTN, hyperlipidemia, paroxysmal atrial fibrillation, and chronic systolic CHF of uncertain etiology.  Patient had an echo in 6/20 as outpatient due to exertional dyspnea. This showed EF 20-25%, moderate RV dysfunction.  She was also noted to have paroxysmal atrial fibrillation.  She was started on Xarelto.  In 7/20, she was admitted with DKA. In 8/20, she was admitted with CHF exacerbation and diuresed.  She was sent home on Lasix 40 mg po bid.   Readmitted on 9/20 with CHF exacerbation, in the setting of elevated BP.  Diuresed with IV lasix.  LHC showed nonobstructive CAD.  RHC showed R>L heart failure with low PAPI suggesting significant RV dysfunction. She was transitioned to PO torsemide and treated w/ guidelines directed medical therapy w/ Entresto, Spironolactone, Coreg and digoxin. She was also transitioned off of Xarelto and placed on Coumadin.  Readmitted 08/25/2246 with A/C systolic heart failure. She had not been taking her lasix because she says it was on her AVS to stop it.  Diuresed with IV lasix and transitioned to torsemide 40 mg daily. Weight day before discharge was 190 pounds. Echo with LV EF <20%, RV severely decreased systolic function.   Echo in 6/21 showed EF 35%, moderately decreased RV systolic function, moderate LAE, mild MR.  Echo was done today and reviewed, EF 35-40%, global hypokinesis, mild LVH, normal RV, IVC normal.   Recently seen by Dr. Aundra Dubin 11/2 and was fluid overloaded. Wt was up 7 lb. ReDs clip elevated at 40%. She was symptomatic, NYHA Class III. Torsemide was increased to 60 mg daily. She was also in sinus tach but already on max dose of Coreg.  Ivabradine 5 mg bid was added. She was also ordered to get a cMRI to look for scar/infiltrative disease  and quantify EF. This has not been done yet.   F/u on 06/16/20. Wt only down 2 lb. ReDs Clip still elevated but down slightly at 38%. EKG shows improved HR. In NSR. HR 84 bpm. BP elevated at 150/100 but she has not yet taken AM meds. Concern that she may have a yeast infection. Notes vaginal irritation/ itching but no dysuria. Has had yeast infections in the past. Also concern that she may be pregnant. She is 7 days late w/ her period. Not using regular birth control.   Seen in clinic last week on 2/11 and was fluid overloaded.  Enrolled in HF SQ lasix trial and given dose of SQ lasix in clinic and sent home w/ 80 mg SQ lasix. BMP was obtained and showed severe hyperglycemia > 600. Referred to the ED and was admitted by [facility] for DKA. Placed on IV fluid hydration, IV insulin drip.  Torsemide was held. Once gap was closed, no nausea or vomiting, CBGs stable, patient was transitioned to subcutaneous insulin. Her hgb A1c was 13.6. She was placed on NovoLog 70/30 15 units twice a day and instructed to c/w Jardiance.   Last seen in the HF clinic 09/10/20. At time she had stopped taking her medications because she thought she was pregnant. Urine  pregnancy test was negative.   Today she returns for HF follow up.Overall feeling fine. Denies SOB/PND/Orthopnea. Appetite ok. No fever or chills. She took a few meds yesterday but then went to her boyfriends house and didn't take  medications with her. She plans to go home today and take medications. She does not want to go back to hospital.   Labs (5/21): hgbA1c 9.3, BNP 76, K 3.9, creatinine 1.1 Labs (6/21): digoxin 0.4, K 4.1, creatinine 0.77 Labs (10/21): BNP 25, K 4.1, creatinine 0.91 Labs (11/21): K 3.7, creatinine 0.6, digoxin <0.2 Labs (12/21): K 4.2, creatinine 0.83 Labs (07/28/2020): K 3.9 Creatinine 0/7  Labs ( 09/03/20): K 3.8 Creatinine 0.5 Glucose 428  Labs 09/10/20: Pregnancy Test negative.   PMH: 1. Type 2 diabetes: H/o DKA 2. HTN 3. OSA 4.  Obesity 5. Polycystic ovarian syndrome 6. Chronic systolic CHF: Nonischemic cardiomyopathy - Echo (6/20) with EF 20-25%, moderate RV dysfunction.  - LHC/RHC (9/20): No significant CAD.  - Echo (3/21): EF <20%, moderate LVH with global HK, severely reduced RV systolic function.  - Echo (6/21): EF 35%, moderately decreased RV systolic function, moderate LAE, mild MR. - Echo (11/21): EF 35-40%, global hypokinesis, mild LVH, normal RV, IVC normal.  7. Hyperlipidemia 8. Atrial fibrillation: Paroxysmal.  9. History of LV thrombus.    Current Outpatient Medications  Medication Sig Dispense Refill   Accu-Chek Softclix Lancets lancets Use as instructed 100 each 12   atorvastatin (LIPITOR) 40 MG tablet TAKE 1 TABLET BY MOUTH DAILY AT 6 PM. 30 tablet 2   blood glucose meter kit and supplies KIT Dispense based on patient and insurance preference. Use up to four times daily as directed. (FOR ICD-9 250.00, 250.01). 1 each 0   Blood Glucose Monitoring Suppl (ACCU-CHEK AVIVA PLUS) w/Device KIT 1 application by Does not apply route QID. Check fasting sugars as well as throughout the day 1 kit 0   busPIRone (BUSPAR) 7.5 MG tablet Take 7.5 mg by mouth 2 (two) times daily.     carvedilol (COREG) 25 MG tablet Take 1 tablet (25 mg total) by mouth 2 (two) times daily with a meal. 180 tablet 3   digoxin (LANOXIN) 0.125 MG tablet Take 1 tablet (0.125 mg total) by mouth daily. 30 tablet 3   empagliflozin (JARDIANCE) 25 MG TABS tablet Take 25 mg by mouth daily. 90 tablet 3   gabapentin (NEURONTIN) 100 MG capsule Take 100 mg by mouth 2 (two) times daily.     glucose blood (ACCU-CHEK AVIVA PLUS) test strip Check CBG 2 times per day 100 each 12   glucose blood (AGAMATRIX PRESTO TEST) test strip Use as instructed 100 each 12   hydrOXYzine (ATARAX/VISTARIL) 10 MG tablet TAKE 1 TABLET (10 MG TOTAL) BY MOUTH 3 (THREE) TIMES DAILY AS NEEDED. 30 tablet 0   insulin aspart protamine- aspart (NOVOLOG MIX 70/30)  (70-30) 100 UNIT/ML injection Inject 0.15 mLs (15 Units total) into the skin 2 (two) times daily with a meal. 10 mL 11   Insulin Syringe-Needle U-100 (INSULIN SYRINGE .3CC/31GX5/16") 31G X 5/16" 0.3 ML MISC 15 Units by Does not apply route 2 (two) times daily. 100 each 3   isosorbide-hydrALAZINE (BIDIL) 20-37.5 MG tablet Take 1 tablet by mouth 3 (three) times daily. 90 tablet 3   ivabradine (CORLANOR) 5 MG TABS tablet Take 1 tablet (5 mg total) by mouth 2 (two) times daily with a meal. 60 tablet 3   Lancets Misc. (ACCU-CHEK SOFTCLIX LANCET DEV) KIT 1 application by Does not apply route 2 (two) times daily. 1 kit 0   meclizine (ANTIVERT) 12.5 MG tablet Take 1 tablet (12.5 mg total) by mouth 3 (three) times daily as needed for dizziness. 30 tablet 0   potassium  chloride SA (KLOR-CON) 20 MEQ tablet Take 20 mEq by mouth See admin instructions. Take 1 tablet by mouth every Monday Wednesday and Friday     sacubitril-valsartan (ENTRESTO) 97-103 MG Take 1 tablet by mouth 2 (two) times daily. 180 tablet 3   sertraline (ZOLOFT) 25 MG tablet Take 1 tablet (25 mg total) by mouth daily. 30 tablet 3   spironolactone (ALDACTONE) 25 MG tablet Take 25 mg by mouth daily.     torsemide (DEMADEX) 20 MG tablet Take 5 tablets (100 mg total) by mouth daily.     traZODone (DESYREL) 50 MG tablet TAKE 1 TABLET (50 MG TOTAL) BY MOUTH AT BEDTIME AS NEEDED FOR SLEEP. 30 tablet 0   warfarin (COUMADIN) 7.5 MG tablet Take as directed by Coumadin Clinic (Patient taking differently: Take 15 mg by mouth daily.) 45 tablet 0   albuterol (VENTOLIN HFA) 108 (90 Base) MCG/ACT inhaler Inhale 2 puffs into the lungs every 6 (six) hours as needed for wheezing or shortness of breath. (Patient not taking: Reported on 10/01/2020) 18 g 1   No current facility-administered medications for this encounter.    Allergies  Allergen Reactions   Shellfish Allergy Swelling    Airway involvement including EMS - Has Epi-Pen   Dulaglutide  Nausea And Vomiting and Other (See Comments)    Multiple episodes of vomiting over multiple days   Metformin And Related Diarrhea   Ibuprofen     Per PCP interferes with daily meds       Social History   Socioeconomic History   Marital status: Single    Spouse name: Not on file   Number of children: Not on file   Years of education: Not on file   Highest education level: Not on file  Occupational History   Not on file  Tobacco Use   Smoking status: Never Smoker   Smokeless tobacco: Never Used  Vaping Use   Vaping Use: Never used  Substance and Sexual Activity   Alcohol use: No    Alcohol/week: 0.0 standard drinks   Drug use: No   Sexual activity: Yes  Other Topics Concern   Not on file  Social History Narrative   Not on file   Social Determinants of Health   Financial Resource Strain: High Risk   Difficulty of Paying Living Expenses: Hard  Food Insecurity: No Food Insecurity   Worried About Running Out of Food in the Last Year: Never true   Ran Out of Food in the Last Year: Never true  Transportation Needs: No Transportation Needs   Lack of Transportation (Medical): No   Lack of Transportation (Non-Medical): No  Physical Activity: Insufficiently Active   Days of Exercise per Week: 7 days   Minutes of Exercise per Session: 20 min  Stress: Stress Concern Present   Feeling of Stress : Very much  Social Connections: Not on file  Intimate Partner Violence: Not At Risk   Fear of Current or Ex-Partner: No   Emotionally Abused: No   Physically Abused: No   Sexually Abused: No      Family History  Problem Relation Age of Onset   Diabetes Mother    Hypertension Mother    Hypertension Father    Diabetes Father     Vitals:   10/01/20 1047  BP: (!) 212/114  Pulse: 95  SpO2: 98%  Weight: 85.9 kg (189 lb 6.4 oz)    Wt Readings from Last 3 Encounters:  10/01/20 85.9 kg (189 lb 6.4 oz)  09/10/20 87.7 kg (193 lb 6.4 oz)   09/04/20 85.9 kg (189 lb 6.4 oz)     PHYSICAL EXAM: General:  Walked in the clinic.  No resp difficulty HEENT: normal Neck: supple. no JVD. Carotids 2+ bilat; no bruits. No lymphadenopathy or thryomegaly appreciated. Cor: PMI nondisplaced. Regular rate & rhythm. No rubs, gallops or murmurs. Lungs: clear Abdomen: soft, nontender, nondistended. No hepatosplenomegaly. No bruits or masses. Good bowel sounds. Extremities: no cyanosis, clubbing, rash, edema Neuro: alert & orientedx3, cranial nerves grossly intact. moves all 4 extremities w/o difficulty. Affect pleasant  ASSESSMENT & PLAN:  1.Chronic systolic CHF:  Nonischemic cardiomyopathy, diagnosed in 6/20. Repeat Echo 03/2019 showed EF 20-25%, diffuse hypokinesis, LV thrombus, moderate RV systolic dysfunction, mild MR, moderate TR. Echo in 3/21 with EF <30%, severe RV dysfunction. She has a history of paroxysmal atrial fibrillation but seems to be mostly in sinus rhythm. This is not likely to be a tachycardia-mediated car%.diomyopathy. Poorly controlled diabetes itself can cause a cardiomyopathy, as can poorly controlled HTN. Myocarditis is a potential cause of her cardiomyopathy. [place] 9/20 showed nonobstructive CAD. RHC showed R>L heart failure with low PAPI suggesting significant RV dysfunction.  Echo in 6/21 with EF up to 35%. Echo 11/21 EF 35-40% Garde I DD - NYHA II. Reds Clip 35%. Volume status mildly elevated but no surprise given she hasnt had  - Urine pregnancy test 2/17 negative.   - Continue Entresto 97/103 bid.  - Continue carvedilol 25 mg bid. - Continue ivabradine 5 mg bid.  - Continue spironolactone 25 mg daily.  - Continue digoxin.   - Continue empagliflozin.  - Continue Bidil 1/2 tab tid - She previously had been trying to get pregnant (has been difficult with polycystic ovarian syndrome). She has 2 grown children already.With her cardiomyopathy and active CHF, pregnancy would create a significant mortality risk for  her. A number of her cardiac meds to control her CHF are feto-toxic.  - discussed importance of strict regular birth control/ use of condoms to avoid pregnancy but she said its in Gods hands.    2. LV thrombus - LV thrombus noted on echo 9/20.  - INR has been subtherapeutic.  - INRs followed by [facility]   3. Atrial fibrillation, Paroxysmal.  -Regular on exam.  - Continue warfarin (using warfarin rather than DOAC with h/o LV thrombus).  - Would consider atrial fibrillation ablation in the future, if recurrence, given CASTLE-HF data.   4. Type 2 diabetes  - poorly controlled. Recent admit 2/21 for DKA w/ Hgb A1c ~13 - outpatient regimen adjusted. Now on Novolog 70/30 15 units a day + Jardiance   - she denies any recent GU infections w/ Jardiance   5. Hypertensive, Urgency -Has not had her medications over the last 24 hours. Suggested ED visit and she was adamantly opposed.  - Stressed that she needs to take all medications.   She is difficult to manage due to poor insight. Frequently misses medications. We have attempted HF Paramedicine, Life Coaches, and HFSW to assist with compliance but we have not been successful.  I am thankful she showed up for the appointment but she maintains hard to manage off meds.  Check BMET and Mag   Follow up in 2-3 weeks.   Darrick Grinder, NP 10/01/20

## 2020-10-08 ENCOUNTER — Telehealth: Payer: Self-pay | Admitting: *Deleted

## 2020-10-08 NOTE — Telephone Encounter (Signed)
Pt is overdue to have INR checked, called pt and LMOM for pt to call coumadin clinic back to schedule an appointment.

## 2020-10-09 ENCOUNTER — Ambulatory Visit (HOSPITAL_COMMUNITY): Payer: Medicaid Other | Admitting: Licensed Clinical Social Worker

## 2020-10-09 NOTE — Telephone Encounter (Signed)
error 

## 2020-10-15 ENCOUNTER — Encounter (HOSPITAL_COMMUNITY): Payer: Self-pay

## 2020-10-15 ENCOUNTER — Ambulatory Visit (HOSPITAL_COMMUNITY)
Admission: RE | Admit: 2020-10-15 | Discharge: 2020-10-15 | Disposition: A | Discharge status: Home / Self Care | Payer: Medicaid Other | Source: Ambulatory Visit | Attending: Adult Health | Admitting: Adult Health

## 2020-10-15 ENCOUNTER — Ambulatory Visit (INDEPENDENT_AMBULATORY_CARE_PROVIDER_SITE_OTHER): Payer: Medicaid Other | Admitting: *Deleted

## 2020-10-15 ENCOUNTER — Other Ambulatory Visit (HOSPITAL_COMMUNITY): Payer: Self-pay | Admitting: Cardiology

## 2020-10-15 ENCOUNTER — Other Ambulatory Visit: Payer: Self-pay

## 2020-10-15 ENCOUNTER — Other Ambulatory Visit (HOSPITAL_COMMUNITY): Payer: Self-pay | Admitting: *Deleted

## 2020-10-15 VITALS — BP 162/110 | HR 106 | Wt 192.2 lb

## 2020-10-15 DIAGNOSIS — I2510 Atherosclerotic heart disease of native coronary artery without angina pectoris: Secondary | ICD-10-CM | POA: Diagnosis not present

## 2020-10-15 DIAGNOSIS — Z5181 Encounter for therapeutic drug level monitoring: Secondary | ICD-10-CM

## 2020-10-15 DIAGNOSIS — Z9114 Patient's other noncompliance with medication regimen: Secondary | ICD-10-CM | POA: Insufficient documentation

## 2020-10-15 DIAGNOSIS — I480 Paroxysmal atrial fibrillation: Secondary | ICD-10-CM | POA: Insufficient documentation

## 2020-10-15 DIAGNOSIS — Z794 Long term (current) use of insulin: Secondary | ICD-10-CM | POA: Diagnosis not present

## 2020-10-15 DIAGNOSIS — I428 Other cardiomyopathies: Secondary | ICD-10-CM | POA: Diagnosis not present

## 2020-10-15 DIAGNOSIS — I5020 Unspecified systolic (congestive) heart failure: Secondary | ICD-10-CM

## 2020-10-15 DIAGNOSIS — Z79899 Other long term (current) drug therapy: Secondary | ICD-10-CM | POA: Insufficient documentation

## 2020-10-15 DIAGNOSIS — I110 Hypertensive heart disease with heart failure: Secondary | ICD-10-CM | POA: Diagnosis present

## 2020-10-15 DIAGNOSIS — Z7901 Long term (current) use of anticoagulants: Secondary | ICD-10-CM | POA: Insufficient documentation

## 2020-10-15 DIAGNOSIS — I5022 Chronic systolic (congestive) heart failure: Secondary | ICD-10-CM | POA: Diagnosis not present

## 2020-10-15 DIAGNOSIS — Z7984 Long term (current) use of oral hypoglycemic drugs: Secondary | ICD-10-CM | POA: Insufficient documentation

## 2020-10-15 DIAGNOSIS — Z596 Low income: Secondary | ICD-10-CM | POA: Diagnosis not present

## 2020-10-15 DIAGNOSIS — R000 Tachycardia, unspecified: Secondary | ICD-10-CM | POA: Diagnosis not present

## 2020-10-15 DIAGNOSIS — Z886 Allergy status to analgesic agent status: Secondary | ICD-10-CM | POA: Diagnosis not present

## 2020-10-15 DIAGNOSIS — E1165 Type 2 diabetes mellitus with hyperglycemia: Secondary | ICD-10-CM | POA: Diagnosis not present

## 2020-10-15 DIAGNOSIS — I513 Intracardiac thrombosis, not elsewhere classified: Secondary | ICD-10-CM | POA: Insufficient documentation

## 2020-10-15 DIAGNOSIS — Z888 Allergy status to other drugs, medicaments and biological substances status: Secondary | ICD-10-CM | POA: Diagnosis not present

## 2020-10-15 DIAGNOSIS — I160 Hypertensive urgency: Secondary | ICD-10-CM | POA: Insufficient documentation

## 2020-10-15 DIAGNOSIS — Z8249 Family history of ischemic heart disease and other diseases of the circulatory system: Secondary | ICD-10-CM | POA: Diagnosis not present

## 2020-10-15 DIAGNOSIS — E282 Polycystic ovarian syndrome: Secondary | ICD-10-CM | POA: Insufficient documentation

## 2020-10-15 LAB — POCT INR: INR: 1.2 — AB (ref 2.0–3.0)

## 2020-10-15 MED ORDER — SACUBITRIL-VALSARTAN 97-103 MG PO TABS: 1.0000 | ORAL_TABLET | Freq: Two times a day (BID) | ORAL | 3 refills | Status: DC

## 2020-10-15 MED ORDER — BIDIL 20-37.5 MG PO TABS: 0.5000 | ORAL_TABLET | Freq: Three times a day (TID) | ORAL | 3 refills | Status: DC

## 2020-10-15 MED FILL — ENTRESTO 97 MG-103 MG TAB: 97-103 | 30 days supply | Qty: 60 | Fill #0

## 2020-10-15 MED FILL — BIDIL 20-37.5 MG TABS: 20-37.5 | 90 days supply | Qty: 135 | Fill #0

## 2020-10-15 NOTE — Telephone Encounter (Signed)
At Home-HF  Telephone Script Day: 17    Date of Phone Call: February 27th, 2022                                    Time: 1300                                                     Subject ID: [id number]        Phone Call Performed:  [x]  Yes  []  No  If No,   [x]  Subject did not respond   []  Subject unavailable   []  Other, specify ___________   Unable to reach subject for Day 17 phone call, protocol deviation was completed.    Weight and Blood pressure:  1. Did you use the provided scale to weigh yourself this morning? []  Yes  [x]  No   If Yes, Did you document it in the diary? []  Yes [x]  No  What was your weight today? _____ lbs.  If No, please ask to the subject to weigh himself/herself now and obtain the measurement. _____ lbs.     2. Did you use the provided "cuff" to take your Blood Pressure and Heart Rate this morning? []  Yes  [x]  No  If yes, did you document it in the Diary?  []  Yes  [x]  No    What was your Blood Pressure and Heart Rate today?   Heart Rate: _______           Blood Pressure: ________         Patient Global Assessment Visual Analog Scale (VAS): Please ask if subject completed the Visual Analog Scale in the Diary.  []  Yes [x]  No If No, Ask to complete now and remind the subject to complete it before next scheduled call or visit.       5-Point Likert Scale: Current Dyspnea Status How much difficulty are you having in breathing now?  []  not short of breath               []  mildly short of breath             []  moderately short of breath             []  severely short of breath                  []  very severely short of breath     7-Point Likert Scale: Patient-Assessed Dyspnea Status Compared to how much difficulty you were having with your breathing just before starting in the study, how is your breathing now?  []  Markedly better       []  Moderately better       []  Minimally better       []  No change       []  Minimally worse       []   Moderately worse       []  Markedly worse       Medications/Changes and Medical Events:  1. How much oral diuretic did you take yesterday? ___________   2. Did you document in the Diary start time and end time for Furoscix Infusor (if applicable)? []  Yes []  No []  N/A   3. Have you had any problems/issues with Infusor? []   Yes []  No []  N/A If yes, please describe: ______________________________________________   4. Since your last visit/call, have you started any new medications or have your medications changed? (review compare to last visit/call)  []  Yes []  No If yes, please specify:________________________________________________   5. Since your last visit/call, have you had any medical events, have you visited a hospital, Emergency Department or seen any other health care provider?  []  Yes []  No If yes, please describe (including treatments):_____________________________

## 2020-10-15 NOTE — Patient Instructions (Signed)
No Labs done today.   DECREASE Bidil to 1/2 tablet by mouth 3 times daily.   No other medication changes were made. Please continue all current medications as prescribed.  Your physician recommends that you schedule a follow-up appointment in: 6 weeks with our APP [facility]  If you have any questions or concerns before your next appointment please send [place] a message through [place] or call our office at [phone].    TO LEAVE A MESSAGE FOR THE NURSE SELECT OPTION 2, PLEASE LEAVE A MESSAGE INCLUDING: . YOUR NAME . DATE OF BIRTH . CALL BACK NUMBER . REASON FOR CALL**this is important as we prioritize the call backs  YOU WILL RECEIVE A CALL BACK THE SAME DAY AS LONG AS YOU CALL BEFORE 4:00 PM   Do the following things EVERYDAY: 1) Weigh yourself in the morning before breakfast. Write it down and keep it in a log. 2) Take your medicines as prescribed 3) Eat low salt foods--Limit salt (sodium) to 2000 mg per day.  4) Stay as active as you can everyday 5) Limit all fluids for the day to less than 2 liters   At the [facility], you and your health needs are our priority. As part of our continuing mission to provide you with exceptional heart care, we have created designated Provider Care Teams. These Care Teams include your primary Cardiologist (physician) and Advanced Practice Providers (APPs- Physician Assistants and Nurse Practitioners) who all work together to provide you with the care you need, when you need it.   You may see any of the following providers on your designated Care Team at your next follow up: Marland Kitchen Dr Glori Bickers . Dr Loralie Champagne . Darrick Grinder, NP . Lyda Jester, PA . [name], PharmD   Please be sure to bring in all your medications bottles to every appointment.

## 2020-10-15 NOTE — Progress Notes (Signed)
Advanced Heart Failure Clinic Note    PCP: Gifford Shave, MD PCP-Cardiologist: Dr. Aundra Dubin  EP- Dr. Curt Bears   HPI: 45 y.o. with history of type 2 diabetes (poorly controlled, last hgbA1c > 15), poorly controlled HTN, hyperlipidemia, paroxysmal atrial fibrillation, and chronic systolic CHF of uncertain etiology.  Patient had an echo in 6/20 as outpatient due to exertional dyspnea. This showed EF 20-25%, moderate RV dysfunction.  She was also noted to have paroxysmal atrial fibrillation.  She was started on Xarelto.  In 7/20, she was admitted with DKA. In 8/20, she was admitted with CHF exacerbation and diuresed.  She was sent home on Lasix 40 mg po bid.   Readmitted on 9/20 with CHF exacerbation, in the setting of elevated BP.  Diuresed with IV lasix.  LHC showed nonobstructive CAD.  RHC showed R>L heart failure with low PAPI suggesting significant RV dysfunction. She was transitioned to PO torsemide and treated w/ guidelines directed medical therapy w/ Entresto, Spironolactone, Coreg and digoxin. She was also transitioned off of Xarelto and placed on Coumadin.  Readmitted 4/0/9811 with A/C systolic heart failure. She had not been taking her lasix because she says it was on her AVS to stop it.  Diuresed with IV lasix and transitioned to torsemide 40 mg daily. Weight day before discharge was 190 pounds. Echo with LV EF <20%, RV severely decreased systolic function.   Echo in 6/21 showed EF 35%, moderately decreased RV systolic function, moderate LAE, mild MR.  Echo was done today and reviewed, EF 35-40%, global hypokinesis, mild LVH, normal RV, IVC normal.   Recently seen by Dr. Aundra Dubin 11/2 and was fluid overloaded. Wt was up 7 lb. ReDs clip elevated at 40%. She was symptomatic, NYHA Class III. Torsemide was increased to 60 mg daily. She was also in sinus tach but already on max dose of Coreg.  Ivabradine 5 mg bid was added. She was also ordered to get a cMRI to look for scar/infiltrative disease  and quantify EF. This has not been done yet.   F/u on 06/16/20. Wt only down 2 lb. ReDs Clip still elevated but down slightly at 38%. EKG shows improved HR. In NSR. HR 84 bpm. BP elevated at 150/100 but she has not yet taken AM meds. Concern that she may have a yeast infection. Notes vaginal irritation/ itching but no dysuria. Has had yeast infections in the past. Also concern that she may be pregnant. She is 7 days late w/ her period. Not using regular birth control.   Seen in clinic last week on 2/11 and was fluid overloaded.  Enrolled in HF SQ lasix trial and given dose of SQ lasix in clinic and sent home w/ 80 mg SQ lasix. BMP was obtained and showed severe hyperglycemia > 600. Referred to the ED and was admitted by [facility] for DKA. Placed on IV fluid hydration, IV insulin drip.  Torsemide was held. Once gap was closed, no nausea or vomiting, CBGs stable, patient was transitioned to subcutaneous insulin. Her hgb A1c was 13.6. She was placed on NovoLog 70/30 15 units twice a day and instructed to c/w Jardiance.   Last seen in the HF clinic 09/10/20. At time she had stopped taking her medications because she thought she was pregnant. Urine  pregnancy test was negative.   Today she returns for HF follow up. Says her period is 6 days late. Took a pregnancy test Sunday and it was negative. Overall feeling fair. Having headaches.  Denies SOB/PND/Orthopnea. Appetite poor.  No fever or chills. Says she didn't take her blood pressure/hf meds today due to headache and nausea. Has all medications but cant take full tablet of bidil. due to headache.   Labs (5/21): hgbA1c 9.3, BNP 76, K 3.9, creatinine 1.1 Labs (6/21): digoxin 0.4, K 4.1, creatinine 0.77 Labs (10/21): BNP 25, K 4.1, creatinine 0.91 Labs (11/21): K 3.7, creatinine 0.6, digoxin <0.2 Labs (12/21): K 4.2, creatinine 0.83 Labs (07/28/2020): K 3.9 Creatinine 0/7  Labs ( 09/03/20): K 3.8 Creatinine 0.5 Glucose 428  Labs 09/10/20: Pregnancy Test negative.    PMH: 1. Type 2 diabetes: H/o DKA 2. HTN 3. OSA 4. Obesity 5. Polycystic ovarian syndrome 6. Chronic systolic CHF: Nonischemic cardiomyopathy - Echo (6/20) with EF 20-25%, moderate RV dysfunction.  - LHC/RHC (9/20): No significant CAD.  - Echo (3/21): EF <20%, moderate LVH with global HK, severely reduced RV systolic function.  - Echo (6/21): EF 35%, moderately decreased RV systolic function, moderate LAE, mild MR. - Echo (11/21): EF 35-40%, global hypokinesis, mild LVH, normal RV, IVC normal.  7. Hyperlipidemia 8. Atrial fibrillation: Paroxysmal.  9. History of LV thrombus.    Current Outpatient Medications  Medication Sig Dispense Refill  . Accu-Chek Softclix Lancets lancets Use as instructed 100 each 12  . albuterol (VENTOLIN HFA) 108 (90 Base) MCG/ACT inhaler Inhale 2 puffs into the lungs every 6 (six) hours as needed for wheezing or shortness of breath. 18 g 1  . atorvastatin (LIPITOR) 40 MG tablet TAKE 1 TABLET BY MOUTH DAILY AT 6 PM. 30 tablet 2  . blood glucose meter kit and supplies KIT Dispense based on patient and insurance preference. Use up to four times daily as directed. (FOR ICD-9 250.00, 250.01). 1 each 0  . Blood Glucose Monitoring Suppl (ACCU-CHEK AVIVA PLUS) w/Device KIT 1 application by Does not apply route QID. Check fasting sugars as well as throughout the day 1 kit 0  . busPIRone (BUSPAR) 7.5 MG tablet Take 7.5 mg by mouth 2 (two) times daily.    . carvedilol (COREG) 25 MG tablet Take 1 tablet (25 mg total) by mouth 2 (two) times daily with a meal. 180 tablet 3  . digoxin (LANOXIN) 0.125 MG tablet Take 1 tablet (0.125 mg total) by mouth daily. 30 tablet 3  . empagliflozin (JARDIANCE) 25 MG TABS tablet Take 25 mg by mouth daily. 90 tablet 3  . gabapentin (NEURONTIN) 100 MG capsule Take 100 mg by mouth 2 (two) times daily.    Marland Kitchen glucose blood (ACCU-CHEK AVIVA PLUS) test strip Check CBG 2 times per day 100 each 12  . glucose blood (AGAMATRIX PRESTO TEST) test  strip Use as instructed 100 each 12  . hydrOXYzine (ATARAX/VISTARIL) 10 MG tablet TAKE 1 TABLET (10 MG TOTAL) BY MOUTH 3 (THREE) TIMES DAILY AS NEEDED. 30 tablet 0  . insulin aspart protamine- aspart (NOVOLOG MIX 70/30) (70-30) 100 UNIT/ML injection Inject 0.15 mLs (15 Units total) into the skin 2 (two) times daily with a meal. 10 mL 11  . Insulin Syringe-Needle U-100 (INSULIN SYRINGE .3CC/31GX5/16") 31G X 5/16" 0.3 ML MISC 15 Units by Does not apply route 2 (two) times daily. 100 each 3  . isosorbide-hydrALAZINE (BIDIL) 20-37.5 MG tablet Take 1 tablet by mouth 3 (three) times daily. 90 tablet 3  . ivabradine (CORLANOR) 5 MG TABS tablet Take 1 tablet (5 mg total) by mouth 2 (two) times daily with a meal. 60 tablet 3  . Lancets Misc. (ACCU-CHEK SOFTCLIX LANCET DEV) KIT 1 application  by Does not apply route 2 (two) times daily. 1 kit 0  . meclizine (ANTIVERT) 12.5 MG tablet Take 1 tablet (12.5 mg total) by mouth 3 (three) times daily as needed for dizziness. 30 tablet 0  . potassium chloride SA (KLOR-CON) 20 MEQ tablet Take 20 mEq by mouth See admin instructions. Take 1 tablet by mouth every Monday Wednesday and Friday    . sacubitril-valsartan (ENTRESTO) 97-103 MG Take 1 tablet by mouth 2 (two) times daily. 180 tablet 3  . sertraline (ZOLOFT) 25 MG tablet Take 1 tablet (25 mg total) by mouth daily. 30 tablet 3  . spironolactone (ALDACTONE) 25 MG tablet Take 25 mg by mouth daily.    Marland Kitchen torsemide (DEMADEX) 20 MG tablet Take 5 tablets (100 mg total) by mouth daily.    . traZODone (DESYREL) 50 MG tablet TAKE 1 TABLET (50 MG TOTAL) BY MOUTH AT BEDTIME AS NEEDED FOR SLEEP. 30 tablet 0  . warfarin (COUMADIN) 7.5 MG tablet Take as directed by Coumadin Clinic (Patient taking differently: Take 15 mg by mouth daily.) 45 tablet 0   No current facility-administered medications for this encounter.    Allergies  Allergen Reactions  . Shellfish Allergy Swelling    Airway involvement including EMS - Has Epi-Pen   . Dulaglutide Nausea And Vomiting and Other (See Comments)    Multiple episodes of vomiting over multiple days  . Metformin And Related Diarrhea  . Ibuprofen     Per PCP interferes with daily meds       Social History   Socioeconomic History  . Marital status: Single    Spouse name: Not on file  . Number of children: Not on file  . Years of education: Not on file  . Highest education level: Not on file  Occupational History  . Not on file  Tobacco Use  . Smoking status: Never Smoker  . Smokeless tobacco: Never Used  Vaping Use  . Vaping Use: Never used  Substance and Sexual Activity  . Alcohol use: No    Alcohol/week: 0.0 standard drinks  . Drug use: No  . Sexual activity: Yes  Other Topics Concern  . Not on file  Social History Narrative  . Not on file   Social Determinants of Health   Financial Resource Strain: High Risk  . Difficulty of Paying Living Expenses: Hard  Food Insecurity: No Food Insecurity  . Worried About Charity fundraiser in the Last Year: Never true  . Ran Out of Food in the Last Year: Never true  Transportation Needs: No Transportation Needs  . Lack of Transportation (Medical): No  . Lack of Transportation (Non-Medical): No  Physical Activity: Insufficiently Active  . Days of Exercise per Week: 7 days  . Minutes of Exercise per Session: 20 min  Stress: Stress Concern Present  . Feeling of Stress : Very much  Social Connections: Not on file  Intimate Partner Violence: Not At Risk  . Fear of Current or Ex-Partner: No  . Emotionally Abused: No  . Physically Abused: No  . Sexually Abused: No      Family History  Problem Relation Age of Onset  . Diabetes Mother   . Hypertension Mother   . Hypertension Father   . Diabetes Father     Vitals:   10/15/20 1024  BP: (!) 162/110  Pulse: (!) 106  SpO2: 98%  Weight: 87.2 kg (192 lb 3.2 oz)    Wt Readings from Last 3 Encounters:  10/15/20 87.2  kg (192 lb 3.2 oz)  10/01/20 85.9 kg (189  lb 6.4 oz)  09/10/20 87.7 kg (193 lb 6.4 oz)     PHYSICAL EXAM: General:  No resp difficulty HEENT: normal Neck: supple. no JVD. Carotids 2+ bilat; no bruits. No lymphadenopathy or thryomegaly appreciated. Cor: PMI nondisplaced. Regular rate & rhythm. No rubs, gallops or murmurs. Lungs: clear Abdomen: soft, nontender, nondistended. No hepatosplenomegaly. No bruits or masses. Good bowel sounds. Extremities: no cyanosis, clubbing, rash, edema Neuro: alert & orientedx3, cranial nerves grossly intact. moves all 4 extremities w/o difficulty. Affect pleasant  ASSESSMENT & PLAN:  1.Chronic systolic CHF:  Nonischemic cardiomyopathy, diagnosed in 6/20. Repeat Echo 03/2019 showed EF 20-25%, diffuse hypokinesis, LV thrombus, moderate RV systolic dysfunction, mild MR, moderate TR. Echo in 3/21 with EF <30%, severe RV dysfunction. She has a history of paroxysmal atrial fibrillation but seems to be mostly in sinus rhythm. This is not likely to be a tachycardia-mediated car%.diomyopathy. Poorly controlled diabetes itself can cause a cardiomyopathy, as can poorly controlled HTN. Myocarditis is a potential cause of her cardiomyopathy. [place] 9/20 showed nonobstructive CAD. RHC showed R>L heart failure with low PAPI suggesting significant RV dysfunction.  Echo in 6/21 with EF up to 35%. Echo 11/21 EF 35-40% Garde I DD - NYHA II. Volume status status stable.  - Urine pregnancy test 2/17 negative.  Home pregnancy test was negative earlier this week.  - Continue Entresto 97/103 bid.  - Continue carvedilol 25 mg bid. - Continue ivabradine 5 mg bid.  - Continue spironolactone 25 mg daily.  - Continue digoxin.   - Continue empagliflozin.  - Cut back bidil to 1/2 tab three times a day. Can take tylenol as needed for head ache.  - She previously had been trying to get pregnant (has been difficult with polycystic ovarian syndrome). She has 2 grown children already.With her cardiomyopathy and active CHF, pregnancy  would create a significant mortality risk for her. A number of her cardiac meds to control her CHF are feto-toxic.  - discussed importance of strict regular birth control/ use of condoms to avoid pregnancy but she said its in Gods hands.    2. LV thrombus - LV thrombus noted on echo 9/20.  - INR has been subtherapeutic.  Not sure she is taking coumadin.  - INRs followed by [facility] Coumadin clinic   3. Atrial fibrillation, Paroxysmal.  -Regular on exam.  - Continue warfarin (using warfarin rather than DOAC with h/o LV thrombus).  - Would consider atrial fibrillation ablation in the future, if recurrence, given CASTLE-HF data.   4. Type 2 diabetes  - poorly controlled. Recent admit 2/21 for DKA w/ Hgb A1c ~13 - outpatient regimen adjusted. Now on Novolog 70/30 15 units a day + Jardiance   - she denies any recent GU infections w/ Jardiance   5. Hypertensive, Urgency -Remains uncontrolled but not she hasn't had medications today.   Follow up in 6 week-8 weeks. Difficult to manage due to medication noncompliance. Asked her to follow up with PCP for diabetes management and GYN issues. Discussed that if she gets pregnant she will be at high risk she verbalized understanding but does not plan to avoid pregnancy.   Darrick Grinder, NP 10/15/20

## 2020-10-15 NOTE — Telephone Encounter (Signed)
At Home-HF  Telephone Script Day: Day 6    Date of Phone Call: February 16th, 2022                                   Time: 11am                                                     Subject ID: [id number]        Phone Call Performed:  [x]  Yes  []  No  If No,   []  Subject did not respond   []  Subject unavailable   []  Other, specify ___________     Weight and Blood pressure:  1. Did you use the provided scale to weigh yourself this morning? [x]  Yes  []  No   If Yes, Did you document it in the diary? [x]  Yes []  No  What was your weight today? 194 lbs.  If No, please ask to the subject to weigh himself/herself now and obtain the measurement. _____ lbs.     2. Did you use the provided "cuff" to take your Blood Pressure and Heart Rate this morning? [x]  Yes  []  No  If yes, did you document it in the Diary?  [x]  Yes  []  No    What was your Blood Pressure and Heart Rate today?   Heart Rate: 109 bpm          Blood Pressure: 169/115 mmHg          Patient Global Assessment Visual Analog Scale (VAS): Please ask if subject completed the Visual Analog Scale in the Diary.  [x]  Yes []  No If No, Ask to complete now and remind the subject to complete it before next scheduled call or visit.       5-Point Likert Scale: Current Dyspnea Status How much difficulty are you having in breathing now?  []  not short of breath               []  mildly short of breath             [x]  moderately short of breath             []  severely short of breath                  []  very severely short of breath     7-Point Likert Scale: Patient-Assessed Dyspnea Status Compared to how much difficulty you were having with your breathing just before starting in the study, how is your breathing now?  []  Markedly better       []  Moderately better       []  Minimally better       [x]  No change       []  Minimally worse       []  Moderately worse       []  Markedly worse       Medications/Changes and  Medical Events:  1. How much oral diuretic did you take yesterday? None    2. Did you document in the Diary start time and end time for Furoscix Infusor (if applicable)? []  Yes []  No [x]  N/A   3. Have you had any problems/issues with Infusor? []  Yes []  No [x]  N/A If yes, please describe: ______________________________________________  4. Since your last visit/call, have you started any new medications or have your medications changed? (review compare to last visit/call)  []  Yes [x]  No If yes, please specify:________________________________________________   5. Since your last visit/call, have you had any medical events, have you visited a hospital, Emergency Department or seen any other health care provider?  []  Yes [x]  No If yes, please describe (including treatments):_____________________________

## 2020-10-15 NOTE — Telephone Encounter (Signed)
  [employer] Protocol [id number] AT [facility]  Patient Global Assessment Visual Analog Scale (EQ-VAS)        Subject Number:   [id number]  Date Completed:        February 16th, 2022  Study Day:   6       . We would like to know how good or bad your health is TODAY. Marland Kitchen This scale is numbered 0 to 100. . 100 means the best health you can imagine.       0 means the worst health you can imagine. . Please let me know the number you feel indicated how your health is TODAY.    VAS SCORE - YOUR HEALTH TODAY : 60

## 2020-10-15 NOTE — Patient Instructions (Signed)
Description    Take 2 tablets of warfarin today and then continue to take 1.5 tablets daily except for 1 tablet on Sundays. Recheck INR in 1 week. Coumadin Clinic [phone].

## 2020-10-15 NOTE — Telephone Encounter (Addendum)
Advanced Heart Failure Patient Advocate Encounter  Prior Authorization for [name] 97-103 has been approved.    PA# [id number] Effective dates: 10/15/20 through 10/13/21  [name] (CMA), a request to send new RX to [place] [facility].  [name], CPhT

## 2020-10-16 ENCOUNTER — Telehealth (HOSPITAL_COMMUNITY): Payer: Self-pay | Admitting: Licensed Clinical Social Worker

## 2020-10-16 NOTE — Telephone Encounter (Signed)
CSW called pt to discuss new womens' "Heart Strong" support group.  Unable to reach patient- left VM requesting return call to discuss and gauge interest in being added to the mailing/reminder list for support group.  CSW will continue to follow and assist as needed  [name], [facility] Desk#: [phone] Cell#: [phone]

## 2020-10-19 ENCOUNTER — Telehealth: Payer: Self-pay

## 2020-10-19 DIAGNOSIS — Z006 Encounter for examination for normal comparison and control in clinical research program: Secondary | ICD-10-CM

## 2020-10-20 NOTE — Progress Notes (Signed)
  Avoiding Treatment in the Hospital with Furoscix for the Management of Congestion in Heart Failure - A Pilot Study  AT [facility]   Visit Day: Day 7    Subject Number: [id number]    Date: February 17th, 2022    Respiration Rate:  (bpm) 22 ReDS:  (%) 31

## 2020-10-22 ENCOUNTER — Other Ambulatory Visit: Payer: Self-pay | Admitting: Cardiology

## 2020-10-22 ENCOUNTER — Other Ambulatory Visit: Payer: Self-pay

## 2020-10-22 ENCOUNTER — Ambulatory Visit (INDEPENDENT_AMBULATORY_CARE_PROVIDER_SITE_OTHER): Payer: Medicaid Other | Admitting: *Deleted

## 2020-10-22 DIAGNOSIS — Z5181 Encounter for therapeutic drug level monitoring: Secondary | ICD-10-CM | POA: Diagnosis not present

## 2020-10-22 DIAGNOSIS — I480 Paroxysmal atrial fibrillation: Secondary | ICD-10-CM

## 2020-10-22 LAB — POCT INR: INR: 1.7 — AB (ref 2.0–3.0)

## 2020-10-22 MED ORDER — WARFARIN SODIUM 7.5 MG PO TABS: ORAL_TABLET | ORAL | 0 refills | Status: DC

## 2020-10-22 NOTE — Patient Instructions (Signed)
Description    Take 2 tablets of warfarin today and then start taking warfarin 1.5 tablets daily. Recheck INR in 1 week. Coumadin Clinic [phone].

## 2020-10-23 ENCOUNTER — Ambulatory Visit (HOSPITAL_COMMUNITY): Payer: Self-pay | Admitting: Licensed Clinical Social Worker

## 2020-10-24 ENCOUNTER — Other Ambulatory Visit (HOSPITAL_COMMUNITY): Payer: Self-pay

## 2020-10-24 ENCOUNTER — Other Ambulatory Visit: Payer: Self-pay

## 2020-10-24 MED FILL — Warfarin Sodium Tab 7.5 MG: ORAL | 34 days supply | Qty: 25 | Fill #0 | Status: CN

## 2020-10-25 ENCOUNTER — Other Ambulatory Visit: Payer: Self-pay

## 2020-10-30 ENCOUNTER — Other Ambulatory Visit: Payer: Self-pay

## 2020-10-30 ENCOUNTER — Other Ambulatory Visit (HOSPITAL_BASED_OUTPATIENT_CLINIC_OR_DEPARTMENT_OTHER): Payer: Self-pay

## 2020-10-30 ENCOUNTER — Ambulatory Visit (INDEPENDENT_AMBULATORY_CARE_PROVIDER_SITE_OTHER): Payer: Medicaid Other | Admitting: *Deleted

## 2020-10-30 ENCOUNTER — Other Ambulatory Visit (HOSPITAL_COMMUNITY): Payer: Self-pay

## 2020-10-30 DIAGNOSIS — I480 Paroxysmal atrial fibrillation: Secondary | ICD-10-CM

## 2020-10-30 DIAGNOSIS — Z5181 Encounter for therapeutic drug level monitoring: Secondary | ICD-10-CM

## 2020-10-30 LAB — POCT INR: INR: 2.7 (ref 2.0–3.0)

## 2020-10-30 MED FILL — Warfarin Sodium Tab 7.5 MG: ORAL | 33 days supply | Qty: 50 | Fill #0 | Status: AC

## 2020-10-30 MED FILL — Albuterol Sulfate Inhal Aero 108 MCG/ACT (90MCG Base Equiv): RESPIRATORY_TRACT | 25 days supply | Qty: 8.5 | Fill #0 | Status: AC

## 2020-10-30 NOTE — Patient Instructions (Signed)
Description    Continue to take 1.5 tablets daily except for 1 tablet on Sundays. Recheck INR in 2 weeks. Coumadin Clinic [phone].

## 2020-11-09 NOTE — Addendum Note (Signed)
Encounter addended by: Consuelo Pandy, PA-C on: 11/09/2020 3:29 PM  Actions taken: Clinical Note Signed

## 2020-11-17 ENCOUNTER — Other Ambulatory Visit: Payer: Self-pay

## 2020-11-24 ENCOUNTER — Emergency Department (HOSPITAL_COMMUNITY): Payer: Medicaid Other

## 2020-11-24 ENCOUNTER — Emergency Department (HOSPITAL_COMMUNITY)
Admission: EM | Admit: 2020-11-24 | Discharge: 2020-11-24 | Disposition: A | Discharge status: Home / Self Care | Payer: Medicaid Other | Attending: Emergency Medicine | Admitting: Emergency Medicine

## 2020-11-24 ENCOUNTER — Other Ambulatory Visit: Payer: Self-pay

## 2020-11-24 ENCOUNTER — Encounter (HOSPITAL_COMMUNITY): Payer: Self-pay

## 2020-11-24 DIAGNOSIS — Z794 Long term (current) use of insulin: Secondary | ICD-10-CM | POA: Diagnosis not present

## 2020-11-24 DIAGNOSIS — E1165 Type 2 diabetes mellitus with hyperglycemia: Secondary | ICD-10-CM | POA: Insufficient documentation

## 2020-11-24 DIAGNOSIS — Z7901 Long term (current) use of anticoagulants: Secondary | ICD-10-CM | POA: Diagnosis not present

## 2020-11-24 DIAGNOSIS — E1110 Type 2 diabetes mellitus with ketoacidosis without coma: Secondary | ICD-10-CM | POA: Diagnosis not present

## 2020-11-24 DIAGNOSIS — R739 Hyperglycemia, unspecified: Secondary | ICD-10-CM | POA: Diagnosis not present

## 2020-11-24 DIAGNOSIS — I110 Hypertensive heart disease with heart failure: Secondary | ICD-10-CM | POA: Diagnosis not present

## 2020-11-24 DIAGNOSIS — Z79899 Other long term (current) drug therapy: Secondary | ICD-10-CM | POA: Diagnosis not present

## 2020-11-24 DIAGNOSIS — I5043 Acute on chronic combined systolic (congestive) and diastolic (congestive) heart failure: Secondary | ICD-10-CM | POA: Insufficient documentation

## 2020-11-24 DIAGNOSIS — R000 Tachycardia, unspecified: Secondary | ICD-10-CM | POA: Diagnosis not present

## 2020-11-24 DIAGNOSIS — R42 Dizziness and giddiness: Secondary | ICD-10-CM | POA: Diagnosis not present

## 2020-11-24 DIAGNOSIS — R0602 Shortness of breath: Secondary | ICD-10-CM | POA: Diagnosis not present

## 2020-11-24 DIAGNOSIS — J45909 Unspecified asthma, uncomplicated: Secondary | ICD-10-CM | POA: Diagnosis not present

## 2020-11-24 DIAGNOSIS — I10 Essential (primary) hypertension: Secondary | ICD-10-CM | POA: Diagnosis not present

## 2020-11-24 LAB — BLOOD GAS, VENOUS
Acid-Base Excess: 1.4 mmol/L (ref 0.0–2.0)
Bicarbonate: 25.5 mmol/L (ref 20.0–28.0)
O2 Saturation: 95.7 %
Patient temperature: 98.6
pCO2, Ven: 40.5 mmHg — ABNORMAL LOW (ref 44.0–60.0)
pH, Ven: 7.415 (ref 7.250–7.430)
pO2, Ven: 77.5 mmHg — ABNORMAL HIGH (ref 32.0–45.0)

## 2020-11-24 LAB — CBC WITH DIFFERENTIAL/PLATELET
Abs Immature Granulocytes: 0.07 10*3/uL (ref 0.00–0.07)
Basophils Absolute: 0.1 10*3/uL (ref 0.0–0.1)
Basophils Relative: 0 %
Eosinophils Absolute: 0.1 10*3/uL (ref 0.0–0.5)
Eosinophils Relative: 0 %
HCT: 37.1 % (ref 36.0–46.0)
Hemoglobin: 11.5 g/dL — ABNORMAL LOW (ref 12.0–15.0)
Immature Granulocytes: 1 %
Lymphocytes Relative: 20 %
Lymphs Abs: 2.9 10*3/uL (ref 0.7–4.0)
MCH: 24 pg — ABNORMAL LOW (ref 26.0–34.0)
MCHC: 31 g/dL (ref 30.0–36.0)
MCV: 77.5 fL — ABNORMAL LOW (ref 80.0–100.0)
Monocytes Absolute: 0.6 10*3/uL (ref 0.1–1.0)
Monocytes Relative: 4 %
Neutro Abs: 11 10*3/uL — ABNORMAL HIGH (ref 1.7–7.7)
Neutrophils Relative %: 75 %
Platelets: 350 10*3/uL (ref 150–400)
RBC: 4.79 MIL/uL (ref 3.87–5.11)
RDW: 16.4 % — ABNORMAL HIGH (ref 11.5–15.5)
WBC: 14.6 10*3/uL — ABNORMAL HIGH (ref 4.0–10.5)
nRBC: 0 % (ref 0.0–0.2)

## 2020-11-24 LAB — COMPREHENSIVE METABOLIC PANEL
ALT: 10 U/L (ref 0–44)
AST: 16 U/L (ref 15–41)
Albumin: 3 g/dL — ABNORMAL LOW (ref 3.5–5.0)
Alkaline Phosphatase: 135 U/L — ABNORMAL HIGH (ref 38–126)
Anion gap: 8 (ref 5–15)
BUN: 8 mg/dL (ref 6–20)
CO2: 25 mmol/L (ref 22–32)
Calcium: 9.1 mg/dL (ref 8.9–10.3)
Chloride: 102 mmol/L (ref 98–111)
Creatinine, Ser: 0.69 mg/dL (ref 0.44–1.00)
GFR, Estimated: >60 mL/min (ref >60)
Glucose, Bld: 432 mg/dL — ABNORMAL HIGH (ref 70–99)
Potassium: 4 mmol/L (ref 3.5–5.1)
Sodium: 135 mmol/L (ref 135–145)
Total Bilirubin: 0.6 mg/dL (ref 0.3–1.2)
Total Protein: 7.6 g/dL (ref 6.5–8.1)

## 2020-11-24 LAB — I-STAT CHEM 8, ED
BUN: 7 mg/dL (ref 6–20)
Calcium, Ion: 1.18 mmol/L (ref 1.15–1.40)
Chloride: 97 mmol/L — ABNORMAL LOW (ref 98–111)
Creatinine, Ser: 0.7 mg/dL (ref 0.44–1.00)
Glucose, Bld: 465 mg/dL — ABNORMAL HIGH (ref 70–99)
HCT: 39 % (ref 36.0–46.0)
Hemoglobin: 13.3 g/dL (ref 12.0–15.0)
Potassium: 4 mmol/L (ref 3.5–5.1)
Sodium: 134 mmol/L — ABNORMAL LOW (ref 135–145)
TCO2: 24 mmol/L (ref 22–32)

## 2020-11-24 LAB — CBG MONITORING, ED
Glucose-Capillary: 463 mg/dL — ABNORMAL HIGH (ref 70–99)
Glucose-Capillary: 394 mg/dL — ABNORMAL HIGH (ref 70–99)
Glucose-Capillary: 254 mg/dL — ABNORMAL HIGH (ref 70–99)

## 2020-11-24 LAB — TROPONIN I (HIGH SENSITIVITY): Troponin I (High Sensitivity): 9 ng/L (ref <18)

## 2020-11-24 LAB — PROTIME-INR
INR: 1 (ref 0.8–1.2)
Prothrombin Time: 13.2 seconds (ref 11.4–15.2)

## 2020-11-24 LAB — BRAIN NATRIURETIC PEPTIDE: B Natriuretic Peptide: 47.5 pg/mL (ref 0.0–100.0)

## 2020-11-24 MED ORDER — CARVEDILOL 12.5 MG PO TABS
25.0000 mg | ORAL_TABLET | Freq: Once | ORAL | Status: AC
  Administered 2020-11-24: 25 mg via ORAL
  Filled 2020-11-24: qty 2

## 2020-11-24 MED ORDER — INSULIN ASPART 100 UNIT/ML IJ SOLN
10.0000 [IU] | Freq: Once | INTRAMUSCULAR | Status: AC
  Administered 2020-11-24: 10 [IU] via SUBCUTANEOUS
  Filled 2020-11-24: qty 0.1

## 2020-11-24 MED ORDER — SODIUM CHLORIDE 0.9 % IV BOLUS
1000.0000 mL | Freq: Once | INTRAVENOUS | Status: AC
  Administered 2020-11-24: 1000 mL via INTRAVENOUS

## 2020-11-24 MED ORDER — INSULIN ASPART 100 UNIT/ML IJ SOLN
5.0000 [IU] | Freq: Once | INTRAMUSCULAR | Status: DC
  Filled 2020-11-24: qty 0.05

## 2020-11-24 MED ORDER — FENTANYL CITRATE (PF) 100 MCG/2ML IJ SOLN
50.0000 ug | Freq: Once | INTRAMUSCULAR | Status: AC
  Administered 2020-11-24: 50 ug via INTRAVENOUS
  Filled 2020-11-24: qty 2

## 2020-11-24 NOTE — ED Provider Notes (Signed)
[facility] DEPT Provider Note   CSN: [id number] Arrival date & time: 11/24/20  1551     History Chief Complaint  Patient presents with  . Hyperglycemia    [name] is a 45 y.o. female history of A. Fib on coumadin, diabetes, heart failure, here presenting with hyperglycemia.  Patient states that she feels lightheaded and dizzy for several days.  She states that it was her birthday 2 days ago she ate some cupcakes as well as drink some sweet tea. She states that she has been urinating very frequently and just feels lightheaded and dizzy whenever she stands up.  She has some mild shortness of breath as well. EMS noted that CBG was > 500. Denies any abdominal pain or vomiting or fever   The history is provided by the patient.       Past Medical History:  Diagnosis Date  . Abnormal Pap smear   . Acute on chronic combined systolic and diastolic CHF (congestive heart failure) ([place]) 02/19/2019  . Acute on chronic congestive heart failure ([place])   . Anemia   . Asthma   . Atrial fibrillation ([place])   . Diabetes mellitus   . DKA, type 2 ([place]) 02/06/2019  . Heart failure with reduced ejection fraction ([place])   . Hypertension   . 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    . Migraines   . Morbid obesity ([place]) 02/06/2019  . Obstructive sleep apnea 06/30/2015  . Polycystic ovarian disease     Patient Active Problem List   Diagnosis Date Noted  . DKA (diabetic ketoacidosis) ([place]) 09/04/2020  . Hypokalemia 10/13/2019  . Hyponatremia 10/13/2019  . Encounter for therapeutic drug monitoring 10/07/2019  . Acute on chronic congestive heart failure ([place])   . Hypertensive urgency 09/30/2019  . Elevated troponin 09/30/2019  . Hypertensive emergency 09/30/2019  . Attempting to conceive 03/10/2019  . Acute on chronic systolic CHF (congestive heart  failure) ([place]) 02/19/2019  . Asthma   . Yeast infection 02/06/2019  . Right ventricular failure ([place]) 02/06/2019  . Morbid obesity ([place]) 02/06/2019  . HFrEF (heart failure with reduced ejection fraction) ([place]) 12/31/2018  . Paroxysmal atrial fibrillation ([place]) 12/31/2018  . Anxiety 12/31/2018  . Hyperlipidemia associated with type 2 diabetes mellitus ([place]) 10/30/2017  . Obstructive sleep apnea 06/30/2015  . Anemia due to blood loss, chronic 06/27/2012  . Allergic rhinitis 06/27/2012  . Major depressive disorder 02/08/2012  . Hypertension associated with diabetes ([place]) 12/04/2011  . Dysmenorrhea 11/22/2011  . Vaginal yeast infection 05/19/2011  . DM2 (diabetes mellitus, type 2) ([place]) 05/19/2011  . PCOS (polycystic ovarian syndrome) 05/19/2011  . Hydrosalpinx 08/21/2009    Past Surgical History:  Procedure Laterality Date  . CERVIX LESION DESTRUCTION    . CESAREAN SECTION    . RIGHT/LEFT HEART CATH AND CORONARY ANGIOGRAPHY N/A 03/29/2019   Procedure: RIGHT/LEFT HEART CATH AND CORONARY ANGIOGRAPHY;  Surgeon: Larey Dresser, MD;  Location: [facility];  Service: Cardiovascular;  Laterality: N/A;     OB History    Gravida  2   Para  2   Term  2   Preterm      AB      Living  2     SAB      IAB      Ectopic      Multiple  Live Births  2           Family History  Problem Relation Age of Onset  . Diabetes Mother   . Hypertension Mother   . Hypertension Father   . Diabetes Father     Social History   Tobacco Use  . Smoking status: Never Smoker  . Smokeless tobacco: Never Used  Vaping Use  . Vaping Use: Never used  Substance Use Topics  . Alcohol use: No    Alcohol/week: 0.0 standard drinks  . Drug use: No    Home Medications Prior to Admission medications   Medication Sig Start Date End Date Taking? Authorizing Provider  Accu-Chek Softclix Lancets lancets Use as instructed 02/12/19   Caroline More, DO  albuterol (VENTOLIN HFA) 108  (90 Base) MCG/ACT inhaler INHALE 2 PUFFS INTO THE LUNGS EVERY 6 (SIX) HOURS AS NEEDED FOR WHEEZING OR SHORTNESS OF BREATH. 06/16/20 06/16/21  Lyda Jester M, PA-C  atorvastatin (LIPITOR) 40 MG tablet TAKE 1 TABLET BY MOUTH DAILY AT 6 PM. 04/01/20 04/01/21  Larey Dresser, MD  blood glucose meter kit and supplies KIT Dispense based on patient and insurance preference. Use up to four times daily as directed. (FOR ICD-9 250.00, 250.01). 09/05/20   Rai, Ripudeep K, MD  Blood Glucose Monitoring Suppl (ACCU-CHEK AVIVA PLUS) w/Device KIT 1 application by Does not apply route QID. Check fasting sugars as well as throughout the day 02/12/19   Caroline More, DO  busPIRone (BUSPAR) 7.5 MG tablet Take 7.5 mg by mouth 2 (two) times daily. 06/03/20   [provider]  busPIRone (BUSPAR) 7.5 MG tablet TAKE 1 TABLET (7.5 MG TOTAL) BY MOUTH 2 (TWO) TIMES DAILY. 02/10/20 02/09/21  Gifford Shave, MD  carvedilol (COREG) 25 MG tablet TAKE 1 TABLET (25 MG TOTAL) BY MOUTH 2 (TWO) TIMES DAILY WITH A MEAL. 01/23/20 01/22/21  Larey Dresser, MD  digoxin (LANOXIN) 0.125 MG tablet Take 1 tablet (0.125 mg total) by mouth daily. 11/15/19   Lyda Jester M, PA-C  digoxin (LANOXIN) 0.125 MG tablet TAKE 1 TABLET (0.125 MG TOTAL) BY MOUTH DAILY. 11/07/19 11/06/20  Lyda Jester M, PA-C  empagliflozin (JARDIANCE) 25 MG TABS tablet Take 25 mg by mouth daily. 10/03/19   Pokhrel, Corrie Mckusick, MD  fluconazole (DIFLUCAN) 150 MG tablet TAKE 1 TABLET (150 MG TOTAL) BY MOUTH DAILY FOR 3 DAYS. 06/16/20 06/16/21  Lyda Jester M, PA-C  gabapentin (NEURONTIN) 100 MG capsule Take 100 mg by mouth 2 (two) times daily.    [provider]  glucose blood (ACCU-CHEK AVIVA PLUS) test strip Check CBG 2 times per day 08/16/19   [name], DO  glucose blood (AGAMATRIX PRESTO TEST) test strip Use as instructed 10/23/17   Zenia Resides, MD  hydrOXYzine (ATARAX/VISTARIL) 10 MG tablet TAKE 1 TABLET (10 MG TOTAL) BY MOUTH 3 (THREE)  TIMES DAILY AS NEEDED. 07/08/20 07/08/21  Gifford Shave, MD  insulin aspart protamine- aspart (NOVOLOG MIX 70/30) (70-30) 100 UNIT/ML injection Inject 0.15 mLs (15 Units total) into the skin 2 (two) times daily with a meal. 09/05/20   Rai, Ripudeep K, MD  Insulin Syringe-Needle U-100 (INSULIN SYRINGE .3CC/31GX5/16") 31G X 5/16" 0.3 ML MISC 15 Units by Does not apply route 2 (two) times daily. 09/05/20   Rai, Ripudeep K, MD  isosorbide-hydrALAZINE (BIDIL) 20-37.5 MG tablet TAKE 0.5 TABLETS BY MOUTH 3 (THREE) TIMES DAILY. 10/15/20 10/15/21  Clegg, Amy D, NP  ivabradine (CORLANOR) 5 MG TABS tablet TAKE 1 TABLET (5 MG TOTAL) BY  MOUTH 2 (TWO) TIMES DAILY WITH A MEAL. 05/26/20 05/26/21  Larey Dresser, MD  Lancets Misc. (ACCU-CHEK SOFTCLIX LANCET DEV) KIT 1 application by Does not apply route 2 (two) times daily. 08/16/19   [name], DO  meclizine (ANTIVERT) 12.5 MG tablet Take 1 tablet (12.5 mg total) by mouth 3 (three) times daily as needed for dizziness. 09/19/19   [name], DO  potassium chloride SA (KLOR-CON) 20 MEQ tablet Take 20 mEq by mouth See admin instructions. Take 1 tablet by mouth every Monday Wednesday and Friday    [provider]  sacubitril-valsartan (ENTRESTO) 97-103 MG TAKE 1 TABLET BY MOUTH 2 (TWO) TIMES DAILY. 10/15/20 10/15/21  Larey Dresser, MD  sertraline (ZOLOFT) 25 MG tablet TAKE 1 TABLET (25 MG TOTAL) BY MOUTH DAILY. 05/26/20 05/26/21  Larey Dresser, MD  spironolactone (ALDACTONE) 25 MG tablet Take 25 mg by mouth daily.    [provider]  torsemide (DEMADEX) 20 MG tablet Take 5 tablets (100 mg total) by mouth daily. 09/06/20   Rai, Vernelle Emerald, MD  traZODone (DESYREL) 50 MG tablet TAKE 1 TABLET (50 MG TOTAL) BY MOUTH AT BEDTIME AS NEEDED FOR SLEEP. 07/08/20 07/08/21  Gifford Shave, MD  warfarin (COUMADIN) 7.5 MG tablet TAKE 1 AND 1/2 TABLETS BY MOUTH OR AS DIRECTED BY COUMADIN CLINIC 10/22/20   Larey Dresser, MD    Allergies    Shellfish allergy,  Dulaglutide, Metformin and related, and Ibuprofen  Review of Systems   Review of Systems  Respiratory: Positive for shortness of breath.   Neurological: Positive for dizziness.  All other systems reviewed and are negative.   Physical Exam Updated Vital Signs BP (!) 165/114   Pulse 87   Temp 98.2 F (36.8 C) (Oral)   Resp 14   Ht '4\' 11"'  (1.499 m)   Wt 86.2 kg   LMP 11/16/2020 (Exact Date)   SpO2 93%   BMI 38.38 kg/m   Physical Exam Vitals and nursing note reviewed.  Constitutional:      Comments: Dehydrated   HENT:     Head: Normocephalic.     Nose: Nose normal.     Mouth/Throat:     Mouth: Mucous membranes are dry.  Eyes:     Extraocular Movements: Extraocular movements intact.     Pupils: Pupils are equal, round, and reactive to light.  Cardiovascular:     Rate and Rhythm: Normal rate and regular rhythm.     Pulses: Normal pulses.     Heart sounds: Normal heart sounds.  Pulmonary:     Effort: Pulmonary effort is normal.     Breath sounds: Normal breath sounds.  Abdominal:     General: Abdomen is flat.     Palpations: Abdomen is soft.  Musculoskeletal:        General: Normal range of motion.     Cervical back: Normal range of motion and neck supple.  Skin:    General: Skin is warm.     Capillary Refill: Capillary refill takes less than 2 seconds.  Neurological:     General: No focal deficit present.     Mental Status: She is oriented to person, place, and time.  Psychiatric:        Mood and Affect: Mood normal.        Behavior: Behavior normal.     ED Results / Procedures / Treatments   Labs (all labs ordered are listed, but only abnormal results are displayed) Labs Reviewed  CBC  WITH DIFFERENTIAL/PLATELET - Abnormal; Notable for the following components:      Result Value   WBC 14.6 (*)    Hemoglobin 11.5 (*)    MCV 77.5 (*)    MCH 24.0 (*)    RDW 16.4 (*)    Neutro Abs 11.0 (*)    All other components within normal limits  COMPREHENSIVE  METABOLIC PANEL - Abnormal; Notable for the following components:   Glucose, Bld 432 (*)    Albumin 3.0 (*)    Alkaline Phosphatase 135 (*)    All other components within normal limits  BLOOD GAS, VENOUS - Abnormal; Notable for the following components:   pCO2, Ven 40.5 (*)    pO2, Ven 77.5 (*)    All other components within normal limits  I-STAT CHEM 8, ED - Abnormal; Notable for the following components:   Sodium 134 (*)    Chloride 97 (*)    Glucose, Bld 465 (*)    All other components within normal limits  CBG MONITORING, ED - Abnormal; Notable for the following components:   Glucose-Capillary 463 (*)    All other components within normal limits  CBG MONITORING, ED - Abnormal; Notable for the following components:   Glucose-Capillary 394 (*)    All other components within normal limits  CBG MONITORING, ED - Abnormal; Notable for the following components:   Glucose-Capillary 254 (*)    All other components within normal limits  PROTIME-INR  BRAIN NATRIURETIC PEPTIDE  I-STAT BETA HCG BLOOD, ED (MC, [facility], AP ONLY)  TROPONIN I (HIGH SENSITIVITY)  TROPONIN I (HIGH SENSITIVITY)    EKG EKG Interpretation  Date/Time:  Tuesday Nov 24 2020 16:24:30 EDT Ventricular Rate:  104 PR Interval:  149 QRS Duration: 83 QT Interval:  346 QTC Calculation: 456 R Axis:   -3 Text Interpretation: Sinus tachycardia LVH by voltage No significant change since last tracing Confirmed by Wandra Arthurs [phone]) on 11/24/2020 4:30:52 PM   Radiology DG Chest Port 1 View  Result Date: 11/24/2020 CLINICAL DATA:  Hyperglycemia, shortness of breath EXAM: PORTABLE CHEST 1 VIEW COMPARISON:  07/28/2020 FINDINGS: Single frontal view of the chest demonstrates an enlarged cardiac silhouette, exacerbated by portable AP technique and positioning. No airspace disease, effusion, or pneumothorax. No acute bony abnormalities. IMPRESSION: 1. Enlarged cardiac silhouette.  No acute airspace disease. Electronically Signed   By:  Randa Ngo M.D.   On: 11/24/2020 16:52    Procedures Procedures   Medications Ordered in ED Medications  sodium chloride 0.9 % bolus 1,000 mL (0 mLs Intravenous Stopped 11/24/20 2015)  insulin aspart (novoLOG) injection 10 Units (10 Units Subcutaneous Given 11/24/20 1648)  fentaNYL (SUBLIMAZE) injection 50 mcg (50 mcg Intravenous Given 11/24/20 1858)    ED Course  I have reviewed the triage vital signs and the nursing notes.  Pertinent labs & imaging results that were available during my care of the patient were reviewed by me and considered in my medical decision making (see chart for details).    MDM Rules/Calculators/A&P                         [name] is a 45 y.o. female here with dizziness and hyperglycemia.  She has been eating a lot of sweets and drinking sweet tea.  I wonder if she has hyperglycemia versus early DKA.  We will get CBC, CMP, VBG. Will hydrate and give SQ insulin.   8:20 PM Glucose is down to  254.  VBG showed no acidosis.  I told her that she should avoid eating sweets.  She also has glucometer at home and told her to check it frequently.  She can follow-up with her PCP   Final Clinical Impression(s) / ED Diagnoses Final diagnoses:  None    Rx / DC Orders ED Discharge Orders    None       Drenda Freeze, MD 11/24/20 2021

## 2020-11-24 NOTE — ED Triage Notes (Signed)
Patient BIB [facility] for elevated BG 500. EMS reports patient is alert and oriented however feeling weakness and fatigue. Patient's BP elevated as well 170/120. Patient states she has missed 2 doses of insulin and BP meds.

## 2020-11-24 NOTE — Discharge Instructions (Signed)
Your blood sugar was elevated.  Please check it often and give yourself insulin as prescribed.  Avoid eating sweets or drinking sweet tea  See your doctor this week for follow-up  Return to ER if you have worse chest pain, glucose greater than 600, passing out

## 2020-11-25 ENCOUNTER — Telehealth: Payer: Self-pay | Admitting: *Deleted

## 2020-11-25 NOTE — Telephone Encounter (Signed)
Transition Care Management Follow-up Telephone Call  Date of discharge and from where: 11/24/2020 - [facility]  How have you been since you were released from the hospital? "A little better, my sugar is still running a little high"  Any questions or concerns? No  Items Reviewed:  Did the pt receive and understand the discharge instructions provided? Yes   Medications obtained and verified? Yes   Other? No   Any new allergies since your discharge? No   Dietary orders reviewed? No  Do you have support at home? Yes   Functional Questionnaire: (I = Independent and D = Dependent) ADLs: I  Bathing/Dressing- I  Meal Prep- I  Eating- I  Maintaining continence- I  Transferring/Ambulation- I  Managing Meds- I  Follow up appointments reviewed:   PCP Hospital f/u appt confirmed? No  - advised her that she needs to call her PCP and make an appointment to continue to follow up on her sugar level  [facility] f/u appt confirmed? No    Are transportation arrangements needed? N/A  If their condition worsens, is the pt aware to call PCP or go to the Emergency Dept.? Yes  Was the patient provided with contact information for the PCP's office or ED? Yes  Was to pt encouraged to call back with questions or concerns? Yes

## 2020-11-26 ENCOUNTER — Encounter (HOSPITAL_COMMUNITY): Payer: Medicaid Other

## 2020-11-27 ENCOUNTER — Ambulatory Visit (INDEPENDENT_AMBULATORY_CARE_PROVIDER_SITE_OTHER): Payer: Medicaid Other

## 2020-11-27 ENCOUNTER — Other Ambulatory Visit: Payer: Self-pay

## 2020-11-27 DIAGNOSIS — Z5181 Encounter for therapeutic drug level monitoring: Secondary | ICD-10-CM | POA: Diagnosis not present

## 2020-11-27 DIAGNOSIS — I480 Paroxysmal atrial fibrillation: Secondary | ICD-10-CM | POA: Diagnosis not present

## 2020-11-27 LAB — POCT INR: INR: 1.4 — AB (ref 2.0–3.0)

## 2020-11-27 NOTE — Patient Instructions (Signed)
-   take 2 tablets warfarin tonight and tomorrow, then   - Continue to take 1.5 tablets daily except for 1 tablet on Sundays.  - Recheck INR in 2 weeks.  [facility] [phone].

## 2020-12-02 ENCOUNTER — Other Ambulatory Visit: Payer: Self-pay

## 2021-01-08 ENCOUNTER — Other Ambulatory Visit: Payer: Self-pay | Admitting: Family Medicine

## 2021-01-08 ENCOUNTER — Other Ambulatory Visit: Payer: Self-pay

## 2021-01-08 ENCOUNTER — Other Ambulatory Visit (HOSPITAL_COMMUNITY): Payer: Self-pay

## 2021-01-08 ENCOUNTER — Ambulatory Visit (INDEPENDENT_AMBULATORY_CARE_PROVIDER_SITE_OTHER): Payer: Medicaid Other | Admitting: Pharmacist

## 2021-01-08 DIAGNOSIS — I480 Paroxysmal atrial fibrillation: Secondary | ICD-10-CM

## 2021-01-08 DIAGNOSIS — Z5181 Encounter for therapeutic drug level monitoring: Secondary | ICD-10-CM

## 2021-01-08 LAB — POCT INR: INR: 1 — AB (ref 2.0–3.0)

## 2021-01-08 MED ORDER — TRAZODONE HCL 50 MG PO TABS
50.0000 mg | ORAL_TABLET | Freq: Every evening | ORAL | 0 refills | Status: DC | PRN
  Filled 2021-01-08: qty 30, 30d supply, fill #0

## 2021-01-08 MED ORDER — WARFARIN SODIUM 7.5 MG PO TABS
ORAL_TABLET | ORAL | 0 refills | Status: DC
Start: 2021-01-08 — End: 2021-03-05
  Filled 2021-01-08: qty 55, 33d supply, fill #0

## 2021-01-08 MED FILL — Sertraline HCl Tab 25 MG: ORAL | 30 days supply | Qty: 30 | Fill #0 | Status: AC

## 2021-01-08 NOTE — Patient Instructions (Signed)
Description   - take 2.5 tablets warfarin tonight and tomorrow, then   - Continue to take 1.5 tablets daily except for 1 tablet on Sundays.  - Recheck INR in 1 week.  [facility] [phone].

## 2021-01-10 ENCOUNTER — Encounter (HOSPITAL_COMMUNITY): Payer: Self-pay

## 2021-01-10 ENCOUNTER — Emergency Department (HOSPITAL_COMMUNITY): Payer: Medicaid Other

## 2021-01-10 ENCOUNTER — Other Ambulatory Visit: Payer: Self-pay

## 2021-01-10 ENCOUNTER — Emergency Department (HOSPITAL_COMMUNITY)
Admission: EM | Admit: 2021-01-10 | Discharge: 2021-01-11 | Disposition: A | Discharge status: Home / Self Care | Payer: Medicaid Other | Attending: Emergency Medicine | Admitting: Emergency Medicine

## 2021-01-10 DIAGNOSIS — G43909 Migraine, unspecified, not intractable, without status migrainosus: Secondary | ICD-10-CM | POA: Insufficient documentation

## 2021-01-10 DIAGNOSIS — R079 Chest pain, unspecified: Secondary | ICD-10-CM | POA: Diagnosis not present

## 2021-01-10 DIAGNOSIS — Z79899 Other long term (current) drug therapy: Secondary | ICD-10-CM | POA: Diagnosis not present

## 2021-01-10 DIAGNOSIS — I5043 Acute on chronic combined systolic (congestive) and diastolic (congestive) heart failure: Secondary | ICD-10-CM | POA: Diagnosis not present

## 2021-01-10 DIAGNOSIS — Z794 Long term (current) use of insulin: Secondary | ICD-10-CM | POA: Insufficient documentation

## 2021-01-10 DIAGNOSIS — E1110 Type 2 diabetes mellitus with ketoacidosis without coma: Secondary | ICD-10-CM | POA: Insufficient documentation

## 2021-01-10 DIAGNOSIS — E871 Hypo-osmolality and hyponatremia: Secondary | ICD-10-CM | POA: Diagnosis not present

## 2021-01-10 DIAGNOSIS — R002 Palpitations: Secondary | ICD-10-CM | POA: Insufficient documentation

## 2021-01-10 DIAGNOSIS — I110 Hypertensive heart disease with heart failure: Secondary | ICD-10-CM | POA: Diagnosis not present

## 2021-01-10 DIAGNOSIS — R0602 Shortness of breath: Secondary | ICD-10-CM | POA: Diagnosis not present

## 2021-01-10 DIAGNOSIS — R791 Abnormal coagulation profile: Secondary | ICD-10-CM | POA: Insufficient documentation

## 2021-01-10 DIAGNOSIS — E1165 Type 2 diabetes mellitus with hyperglycemia: Secondary | ICD-10-CM | POA: Diagnosis not present

## 2021-01-10 DIAGNOSIS — I10 Essential (primary) hypertension: Secondary | ICD-10-CM | POA: Diagnosis not present

## 2021-01-10 DIAGNOSIS — R030 Elevated blood-pressure reading, without diagnosis of hypertension: Secondary | ICD-10-CM | POA: Insufficient documentation

## 2021-01-10 DIAGNOSIS — R000 Tachycardia, unspecified: Secondary | ICD-10-CM | POA: Insufficient documentation

## 2021-01-10 DIAGNOSIS — J45909 Unspecified asthma, uncomplicated: Secondary | ICD-10-CM | POA: Diagnosis not present

## 2021-01-10 DIAGNOSIS — Z7984 Long term (current) use of oral hypoglycemic drugs: Secondary | ICD-10-CM | POA: Diagnosis not present

## 2021-01-10 DIAGNOSIS — Z7901 Long term (current) use of anticoagulants: Secondary | ICD-10-CM | POA: Diagnosis not present

## 2021-01-10 DIAGNOSIS — R519 Headache, unspecified: Secondary | ICD-10-CM | POA: Diagnosis not present

## 2021-01-10 LAB — HEPATIC FUNCTION PANEL
ALT: 9 U/L (ref 0–44)
AST: 15 U/L (ref 15–41)
Albumin: 3.1 g/dL — ABNORMAL LOW (ref 3.5–5.0)
Alkaline Phosphatase: 122 U/L (ref 38–126)
Bilirubin, Direct: <0.1 mg/dL (ref 0.0–0.2)
Total Bilirubin: 0.5 mg/dL (ref 0.3–1.2)
Total Protein: 7.6 g/dL (ref 6.5–8.1)

## 2021-01-10 LAB — TROPONIN I (HIGH SENSITIVITY): Troponin I (High Sensitivity): 12 ng/L (ref <18)

## 2021-01-10 LAB — BASIC METABOLIC PANEL
Anion gap: 12 (ref 5–15)
BUN: 10 mg/dL (ref 6–20)
CO2: 20 mmol/L — ABNORMAL LOW (ref 22–32)
Calcium: 8.9 mg/dL (ref 8.9–10.3)
Chloride: 94 mmol/L — ABNORMAL LOW (ref 98–111)
Creatinine, Ser: 0.93 mg/dL (ref 0.44–1.00)
GFR, Estimated: >60 mL/min (ref >60)
Glucose, Bld: 573 mg/dL (ref 70–99)
Potassium: 4.2 mmol/L (ref 3.5–5.1)
Sodium: 126 mmol/L — ABNORMAL LOW (ref 135–145)

## 2021-01-10 LAB — CBC
HCT: 39.3 % (ref 36.0–46.0)
Hemoglobin: 12.4 g/dL (ref 12.0–15.0)
MCH: 25.1 pg — ABNORMAL LOW (ref 26.0–34.0)
MCHC: 31.6 g/dL (ref 30.0–36.0)
MCV: 79.4 fL — ABNORMAL LOW (ref 80.0–100.0)
Platelets: 356 10*3/uL (ref 150–400)
RBC: 4.95 MIL/uL (ref 3.87–5.11)
RDW: 16.8 % — ABNORMAL HIGH (ref 11.5–15.5)
WBC: 12.9 10*3/uL — ABNORMAL HIGH (ref 4.0–10.5)
nRBC: 0 % (ref 0.0–0.2)

## 2021-01-10 LAB — MAGNESIUM: Magnesium: 1.8 mg/dL (ref 1.7–2.4)

## 2021-01-10 LAB — I-STAT BETA HCG BLOOD, ED (MC, WL, AP ONLY): I-stat hCG, quantitative: <5 m[IU]/mL (ref <5)

## 2021-01-10 LAB — PROTIME-INR
INR: 1 (ref 0.8–1.2)
Prothrombin Time: 13.6 seconds (ref 11.4–15.2)

## 2021-01-10 NOTE — ED Provider Notes (Signed)
Emergency Medicine Provider Triage Evaluation Note  [name] , a 45 y.o. female  was evaluated in triage.  Pt complains of endorses palpitations feeling her heart is racing, some shortness of breath, and headaches with lightheadedness x3 days.  Patient with history of CHF and A. fib on Coumadin.  History of diabetes.  Review of Systems  Positive: Palpitations, shortness of breath, headache, lightheadedness, weakness Negative: fevers, chills, nausea,  Physical Exam  BP (!) 157/107 (BP Location: Right Arm)   Pulse (!) 127   Temp 98.6 F (37 C) (Oral)   Resp 18   Ht 4\' 11"  (1.499 m)   Wt 81.6 kg   SpO2 99%   BMI 36.36 kg/m  Gen:   Awake, no distress   Resp:  Normal effort  MSK:   Moves extremities without difficulty  Other:  Tachycardic with heart rate in the 130s, regular rhythm, no murmurs, gallops, or rubs.  Lungs with decreased breath sounds in the bases bilaterally.  1+ nonpitting BLE edema  Medical Decision Making  Medically screening exam initiated at 7:42 PM.  Appropriate orders placed.  [name] was informed that the remainder of the evaluation will be completed by another provider, this initial triage assessment does not replace that evaluation, and the importance of remaining in the ED until their evaluation is complete.  This chart was dictated using voice recognition software, Dragon. Despite the best efforts of this provider to proofread and correct errors, errors may still occur which can change documentation meaning.    Aura Dials 01/10/21 1950    Arnaldo Natal, MD 01/10/21 2009

## 2021-01-10 NOTE — ED Triage Notes (Signed)
Patient reports hx of CHF, has been having palpitation with migraines and dizziness, reports she is coumadin for afib, denies fever, N/V/D

## 2021-01-11 ENCOUNTER — Emergency Department (HOSPITAL_COMMUNITY): Payer: Medicaid Other

## 2021-01-11 ENCOUNTER — Encounter (HOSPITAL_COMMUNITY): Payer: Self-pay | Admitting: Emergency Medicine

## 2021-01-11 DIAGNOSIS — R519 Headache, unspecified: Secondary | ICD-10-CM | POA: Diagnosis not present

## 2021-01-11 DIAGNOSIS — R0602 Shortness of breath: Secondary | ICD-10-CM | POA: Diagnosis not present

## 2021-01-11 DIAGNOSIS — R079 Chest pain, unspecified: Secondary | ICD-10-CM | POA: Diagnosis not present

## 2021-01-11 LAB — TROPONIN I (HIGH SENSITIVITY): Troponin I (High Sensitivity): 10 ng/L (ref <18)

## 2021-01-11 LAB — TSH: TSH: 1.634 u[IU]/mL (ref 0.350–4.500)

## 2021-01-11 LAB — CBG MONITORING, ED: Glucose-Capillary: 371 mg/dL — ABNORMAL HIGH (ref 70–99)

## 2021-01-11 MED ORDER — DIPHENHYDRAMINE HCL 50 MG/ML IJ SOLN
12.5000 mg | Freq: Once | INTRAMUSCULAR | Status: AC
  Administered 2021-01-11: 12.5 mg via INTRAVENOUS
  Filled 2021-01-11: qty 1

## 2021-01-11 MED ORDER — METOCLOPRAMIDE HCL 5 MG/ML IJ SOLN
10.0000 mg | Freq: Once | INTRAMUSCULAR | Status: AC
  Administered 2021-01-11: 10 mg via INTRAVENOUS
  Filled 2021-01-11: qty 2

## 2021-01-11 MED ORDER — HYDROCHLOROTHIAZIDE 25 MG PO TABS
25.0000 mg | ORAL_TABLET | Freq: Every day | ORAL | Status: DC
  Administered 2021-01-11: 25 mg via ORAL
  Filled 2021-01-11: qty 1

## 2021-01-11 MED ORDER — WARFARIN SODIUM 7.5 MG PO TABS
15.0000 mg | ORAL_TABLET | Freq: Once | ORAL | Status: AC
  Administered 2021-01-11: 15 mg via ORAL
  Filled 2021-01-11: qty 2

## 2021-01-11 MED ORDER — INSULIN ASPART 100 UNIT/ML IJ SOLN
6.0000 [IU] | Freq: Once | INTRAMUSCULAR | Status: AC
  Administered 2021-01-11: 6 [IU] via SUBCUTANEOUS

## 2021-01-11 MED ORDER — IOHEXOL 350 MG/ML SOLN
75.0000 mL | Freq: Once | INTRAVENOUS | Status: AC | PRN
  Administered 2021-01-11: 75 mL via INTRAVENOUS

## 2021-01-11 MED ORDER — KETOROLAC TROMETHAMINE 30 MG/ML IJ SOLN
15.0000 mg | Freq: Once | INTRAMUSCULAR | Status: AC
  Administered 2021-01-11: 15 mg via INTRAVENOUS
  Filled 2021-01-11: qty 1

## 2021-01-11 MED ORDER — MAGNESIUM SULFATE 2 GM/50ML IV SOLN
2.0000 g | Freq: Once | INTRAVENOUS | Status: AC
  Administered 2021-01-11: 2 g via INTRAVENOUS
  Filled 2021-01-11: qty 50

## 2021-01-11 NOTE — ED Provider Notes (Signed)
[facility] EMERGENCY DEPARTMENT Provider Note   CSN: [id number] Arrival date & time: 01/10/21  1908     History Chief Complaint  Patient presents with  . Congestive Heart Failure  . Palpitations  . Dizziness  . Migraine    [name] is a 45 y.o. female.  The history is provided by the patient.  Palpitations Palpitations quality:  Fast Onset quality:  Sudden Duration:  8 hours Timing:  Constant Progression:  Unchanged Chronicity:  New Context: not appetite suppressants, not blood loss, not exercise, not illicit drugs and not stimulant use   Relieved by:  Nothing Worsened by:  Nothing Ineffective treatments:  None tried Associated symptoms: no chest pain, no cough, no diaphoresis, no dizziness, no leg pain, no lower extremity edema, no numbness and no weakness   Risk factors: no hx of atrial fibrillation and no hx of PE   Migraine This is a recurrent problem. The current episode started more than 2 days ago. The problem occurs constantly. The problem has not changed since onset.Associated symptoms include headaches. Pertinent negatives include no chest pain and no abdominal pain. Nothing aggravates the symptoms. Nothing relieves the symptoms. She has tried nothing for the symptoms. The treatment provided no relief.      Past Medical History:  Diagnosis Date  . Abnormal Pap smear   . Acute on chronic combined systolic and diastolic CHF (congestive heart failure) (HCC) 02/19/2019  . Acute on chronic congestive heart failure (HCC)   . Anemia   . Asthma   . Atrial fibrillation (HCC)   . Diabetes mellitus   . DKA, type 2 (HCC) 02/06/2019  . Heart failure with reduced ejection fraction (HCC)   . Hypertension   . 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    . Migraines   . Morbid obesity (HCC) 02/06/2019  . Obstructive sleep apnea 06/30/2015  .  Polycystic ovarian disease     Patient Active Problem List   Diagnosis Date Noted  . DKA (diabetic ketoacidosis) (HCC) 09/04/2020  . Hypokalemia 10/13/2019  . Hyponatremia 10/13/2019  . Encounter for therapeutic drug monitoring 10/07/2019  . Acute on chronic congestive heart failure (HCC)   . Hypertensive urgency 09/30/2019  . Elevated troponin 09/30/2019  . Hypertensive emergency 09/30/2019  . Attempting to conceive 03/10/2019  . Acute on chronic systolic CHF (congestive heart failure) (HCC) 02/19/2019  . Asthma   . Yeast infection 02/06/2019  . Right ventricular failure (HCC) 02/06/2019  . Morbid obesity (HCC) 02/06/2019  . HFrEF (heart failure with reduced ejection fraction) (HCC) 12/31/2018  . Paroxysmal atrial fibrillation (HCC) 12/31/2018  . Anxiety 12/31/2018  . Hyperlipidemia associated with type 2 diabetes mellitus (HCC) 10/30/2017  . Obstructive sleep apnea 06/30/2015  . Anemia due to blood loss, chronic 06/27/2012  . Allergic rhinitis 06/27/2012  . Major depressive disorder 02/08/2012  . Hypertension associated with diabetes (HCC) 12/04/2011  . Dysmenorrhea 11/22/2011  . Vaginal yeast infection 05/19/2011  . DM2 (diabetes mellitus, type 2) (HCC) 05/19/2011  . PCOS (polycystic ovarian syndrome) 05/19/2011  . Hydrosalpinx 08/21/2009    Past Surgical History:  Procedure Laterality Date  . CERVIX LESION DESTRUCTION    . CESAREAN SECTION    . RIGHT/LEFT HEART CATH AND CORONARY ANGIOGRAPHY N/A 03/29/2019   Procedure: RIGHT/LEFT HEART CATH AND CORONARY ANGIOGRAPHY;  Surgeon: McLean, Dalton S, MD;  Location: [facility] INVASIVE CV   LAB;  Service: Cardiovascular;  Laterality: N/A;     OB History     Gravida  2   Para  2   Term  2   Preterm      AB      Living  2      SAB      IAB      Ectopic      Multiple      Live Births  2           Family History  Problem Relation Age of Onset  . Diabetes Mother   . Hypertension Mother   . Hypertension Father    . Diabetes Father     Social History   Tobacco Use  . Smoking status: Never  . Smokeless tobacco: Never  Vaping Use  . Vaping Use: Never used  Substance Use Topics  . Alcohol use: No    Alcohol/week: 0.0 standard drinks  . Drug use: No    Home Medications Prior to Admission medications   Medication Sig Start Date End Date Taking? Authorizing Provider  Accu-Chek Softclix Lancets lancets Use as instructed 02/12/19   Abraham, Sherin, DO  albuterol (VENTOLIN HFA) 108 (90 Base) MCG/ACT inhaler INHALE 2 PUFFS INTO THE LUNGS EVERY 6 (SIX) HOURS AS NEEDED FOR WHEEZING OR SHORTNESS OF BREATH. 06/16/20 06/16/21  Simmons, Brittainy M, PA-C  atorvastatin (LIPITOR) 40 MG tablet TAKE 1 TABLET BY MOUTH DAILY AT 6 PM. 04/01/20 04/01/21  McLean, Dalton S, MD  blood glucose meter kit and supplies KIT Dispense based on patient and insurance preference. Use up to four times daily as directed. (FOR ICD-9 250.00, 250.01). 09/05/20   Rai, Ripudeep K, MD  Blood Glucose Monitoring Suppl (ACCU-CHEK AVIVA PLUS) w/Device KIT 1 application by Does not apply route QID. Check fasting sugars as well as throughout the day 02/12/19   Abraham, Sherin, DO  busPIRone (BUSPAR) 7.5 MG tablet Take 7.5 mg by mouth 2 (two) times daily. 06/03/20   [provider]  busPIRone (BUSPAR) 7.5 MG tablet TAKE 1 TABLET (7.5 MG TOTAL) BY MOUTH 2 (TWO) TIMES DAILY. 02/10/20 02/09/21  Cresenzo, Victor, MD  carvedilol (COREG) 25 MG tablet TAKE 1 TABLET (25 MG TOTAL) BY MOUTH 2 (TWO) TIMES DAILY WITH A MEAL. 01/23/20 01/22/21  McLean, Dalton S, MD  digoxin (LANOXIN) 0.125 MG tablet Take 1 tablet (0.125 mg total) by mouth daily. 11/15/19   Simmons, Brittainy M, PA-C  digoxin (LANOXIN) 0.125 MG tablet TAKE 1 TABLET (0.125 MG TOTAL) BY MOUTH DAILY. 11/07/19 11/06/20  Simmons, Brittainy M, PA-C  empagliflozin (JARDIANCE) 25 MG TABS tablet Take 25 mg by mouth daily. 10/03/19   Pokhrel, Laxman, MD  fluconazole (DIFLUCAN) 150 MG tablet TAKE 1 TABLET (150  MG TOTAL) BY MOUTH DAILY FOR 3 DAYS. 06/16/20 06/16/21  Simmons, Brittainy M, PA-C  gabapentin (NEURONTIN) 100 MG capsule Take 100 mg by mouth 2 (two) times daily.    [provider]  glucose blood (ACCU-CHEK AVIVA PLUS) test strip Check CBG 2 times per day 08/16/19   Bland, Scott, DO  glucose blood (AGAMATRIX PRESTO TEST) test strip Use as instructed 10/23/17   Hensel, William A, MD  hydrOXYzine (ATARAX/VISTARIL) 10 MG tablet TAKE 1 TABLET (10 MG TOTAL) BY MOUTH 3 (THREE) TIMES DAILY AS NEEDED. 07/08/20 07/08/21  Cresenzo, Victor, MD  insulin aspart protamine- aspart (NOVOLOG MIX 70/30) (70-30) 100 UNIT/ML injection Inject 0.15 mLs (15 Units total) into the skin 2 (two) times daily with a meal.   09/05/20   Rai, Ripudeep K, MD  Insulin Syringe-Needle U-100 (INSULIN SYRINGE .3CC/31GX5/16") 31G X 5/16" 0.3 ML MISC 15 Units by Does not apply route 2 (two) times daily. 09/05/20   Rai, Ripudeep K, MD  isosorbide-hydrALAZINE (BIDIL) 20-37.5 MG tablet TAKE 0.5 TABLETS BY MOUTH 3 (THREE) TIMES DAILY. 10/15/20 10/15/21  Clegg, Amy D, NP  ivabradine (CORLANOR) 5 MG TABS tablet TAKE 1 TABLET (5 MG TOTAL) BY MOUTH 2 (TWO) TIMES DAILY WITH A MEAL. 05/26/20 05/26/21  McLean, Dalton S, MD  Lancets Misc. (ACCU-CHEK SOFTCLIX LANCET DEV) KIT 1 application by Does not apply route 2 (two) times daily. 08/16/19   Bland, Scott, DO  meclizine (ANTIVERT) 12.5 MG tablet Take 1 tablet (12.5 mg total) by mouth 3 (three) times daily as needed for dizziness. 09/19/19   Bland, Scott, DO  potassium chloride SA (KLOR-CON) 20 MEQ tablet Take 20 mEq by mouth See admin instructions. Take 1 tablet by mouth every Monday Wednesday and Friday    [provider]  sacubitril-valsartan (ENTRESTO) 97-103 MG TAKE 1 TABLET BY MOUTH 2 (TWO) TIMES DAILY. 10/15/20 10/15/21  McLean, Dalton S, MD  sertraline (ZOLOFT) 25 MG tablet TAKE 1 TABLET (25 MG TOTAL) BY MOUTH DAILY. 05/26/20 05/26/21  McLean, Dalton S, MD  spironolactone (ALDACTONE) 25 MG  tablet Take 25 mg by mouth daily.    [provider]  torsemide (DEMADEX) 20 MG tablet Take 5 tablets (100 mg total) by mouth daily. 09/06/20   Rai, Ripudeep K, MD  traZODone (DESYREL) 50 MG tablet Take 1 tablet (50 mg total) by mouth at bedtime as needed for sleep. 01/08/21   Cresenzo, Victor, MD  warfarin (COUMADIN) 7.5 MG tablet Take 1 to 1.5 tablets by mouth daily as directed by the Coumadin clinic 01/08/21   McLean, Dalton S, MD    Allergies    Shellfish allergy, Dulaglutide, Metformin and related, and Ibuprofen  Review of Systems   Review of Systems  Constitutional:  Negative for diaphoresis and fever.  HENT:  Negative for drooling.   Eyes:  Negative for redness and visual disturbance.  Respiratory:  Negative for cough.   Cardiovascular:  Negative for chest pain.  Gastrointestinal:  Negative for abdominal pain.  Genitourinary:  Negative for difficulty urinating.  Musculoskeletal:  Negative for arthralgias.  Neurological:  Positive for headaches. Negative for dizziness, facial asymmetry, speech difficulty, weakness and numbness.  Psychiatric/Behavioral:  The patient is nervous/anxious.   All other systems reviewed and are negative.  Physical Exam Updated Vital Signs BP (!) 118/117   Pulse (!) 101   Temp 99.6 F (37.6 C) (Oral)   Resp 20   Ht 4' 11" (1.499 m)   Wt 81.6 kg   SpO2 100%   BMI 36.36 kg/m   Physical Exam Vitals and nursing note reviewed.  Constitutional:      General: She is not in acute distress.    Appearance: Normal appearance. She is not diaphoretic.  HENT:     Head: Normocephalic and atraumatic.     Nose: Nose normal.  Eyes:     Extraocular Movements: Extraocular movements intact.     Conjunctiva/sclera: Conjunctivae normal.     Pupils: Pupils are equal, round, and reactive to light.  Cardiovascular:     Rate and Rhythm: Regular rhythm. Tachycardia present.     Pulses: Normal pulses.     Heart sounds: Normal heart sounds.  Pulmonary:      Effort: Pulmonary effort is normal.     Breath sounds:   Normal breath sounds. No wheezing or rales.  Abdominal:     General: Abdomen is flat. Bowel sounds are normal.     Palpations: Abdomen is soft.     Tenderness: There is no abdominal tenderness. There is no guarding.  Musculoskeletal:        General: Normal range of motion.     Cervical back: Normal range of motion and neck supple.     Right lower leg: No edema.     Left lower leg: No edema.  Skin:    General: Skin is warm and dry.     Capillary Refill: Capillary refill takes less than 2 seconds.  Neurological:     General: No focal deficit present.     Mental Status: She is alert and oriented to person, place, and time.  Psychiatric:        Mood and Affect: Mood is anxious. Affect is tearful.    ED Results / Procedures / Treatments   Labs (all labs ordered are listed, but only abnormal results are displayed) Results for orders placed or performed during the hospital encounter of 31/51/76  Basic metabolic panel  Result Value Ref Range   Sodium 126 (L) 135 - 145 mmol/L   Potassium 4.2 3.5 - 5.1 mmol/L   Chloride 94 (L) 98 - 111 mmol/L   CO2 20 (L) 22 - 32 mmol/L   Glucose, Bld 573 (HH) 70 - 99 mg/dL   BUN 10 6 - 20 mg/dL   Creatinine, Ser 0.93 0.44 - 1.00 mg/dL   Calcium 8.9 8.9 - 10.3 mg/dL   GFR, Estimated >60 >60 mL/min   Anion gap 12 5 - 15  CBC  Result Value Ref Range   WBC 12.9 (H) 4.0 - 10.5 K/uL   RBC 4.95 3.87 - 5.11 MIL/uL   Hemoglobin 12.4 12.0 - 15.0 g/dL   HCT 39.3 36.0 - 46.0 %   MCV 79.4 (L) 80.0 - 100.0 fL   MCH 25.1 (L) 26.0 - 34.0 pg   MCHC 31.6 30.0 - 36.0 g/dL   RDW 16.8 (H) 11.5 - 15.5 %   Platelets 356 150 - 400 K/uL   nRBC 0.0 0.0 - 0.2 %  Protime-INR (order if Patient is taking Coumadin / Warfarin)  Result Value Ref Range   Prothrombin Time 13.6 11.4 - 15.2 seconds   INR 1.0 0.8 - 1.2  Magnesium  Result Value Ref Range   Magnesium 1.8 1.7 - 2.4 mg/dL  TSH  Result Value Ref Range    TSH 1.634 0.350 - 4.500 uIU/mL  Hepatic function panel  Result Value Ref Range   Total Protein 7.6 6.5 - 8.1 g/dL   Albumin 3.1 (L) 3.5 - 5.0 g/dL   AST 15 15 - 41 U/L   ALT 9 0 - 44 U/L   Alkaline Phosphatase 122 38 - 126 U/L   Total Bilirubin 0.5 0.3 - 1.2 mg/dL   Bilirubin, Direct <0.1 0.0 - 0.2 mg/dL   Indirect Bilirubin NOT CALCULATED 0.3 - 0.9 mg/dL  I-Stat beta hCG blood, ED  Result Value Ref Range   I-stat hCG, quantitative <5.0 <5 mIU/mL   Comment 3          POC CBG, ED  Result Value Ref Range   Glucose-Capillary 371 (H) 70 - 99 mg/dL  Troponin I (High Sensitivity)  Result Value Ref Range   Troponin I (High Sensitivity) 12 <18 ng/L  Troponin I (High Sensitivity)  Result Value Ref Range   Troponin  I (High Sensitivity) 10 <18 ng/L   DG Chest 2 View  Result Date: 01/10/2021 CLINICAL DATA:  Chest pain EXAM: CHEST - 2 VIEW COMPARISON:  11/24/2020 FINDINGS: The heart size and mediastinal contours are within normal limits. Both lungs are clear. The visualized skeletal structures are unremarkable. IMPRESSION: No active cardiopulmonary disease. Electronically Signed   By: Kevin  Herman M.D.   On: 01/10/2021 20:16   CT Head Wo Contrast  Result Date: 01/11/2021 CLINICAL DATA:  Facial pain EXAM: CT HEAD WITHOUT CONTRAST TECHNIQUE: Contiguous axial images were obtained from the base of the skull through the vertex without intravenous contrast. COMPARISON:  None. FINDINGS: Brain: There is no mass, hemorrhage or extra-axial collection. The size and configuration of the ventricles and extra-axial CSF spaces are normal. 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 Otherwise normal brain parenchyma. Vascular: No abnormal hyperdensity of the major intracranial arteries or dural venous sinuses. No intracranial atherosclerosis. Skull: The visualized skull base, calvarium and extracranial soft tissues are normal. Sinuses/Orbits: No fluid levels or advanced  mucosal thickening of the visualized paranasal sinuses. No mastoid or middle ear effusion. The orbits are normal. IMPRESSION: 1. No acute intracranial abnormality. 2. 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 Electronically Signed   By: Kevin  Herman M.D.   On: 01/11/2021 02:34   CT Angio Chest PE W and/or Wo Contrast  Result Date: 01/11/2021 CLINICAL DATA:  Chest pain and shortness of breath EXAM: CT ANGIOGRAPHY CHEST WITH CONTRAST TECHNIQUE: Multidetector CT imaging of the chest was performed using the standard protocol during bolus administration of intravenous contrast. Multiplanar CT image reconstructions and MIPs were obtained to evaluate the vascular anatomy. CONTRAST:  75mL OMNIPAQUE IOHEXOL 350 MG/ML SOLN COMPARISON:  07/28/2020 FINDINGS: Cardiovascular: Contrast injection is sufficient to demonstrate satisfactory opacification of the pulmonary arteries to the segmental level. There is no pulmonary embolus or evidence of right heart strain. The size of the main pulmonary artery is normal. Heart size is normal, with no pericardial effusion. The course and caliber of the aorta are normal. There is no atherosclerotic calcification. Opacification decreased due to pulmonary arterial phase contrast bolus timing. Mediastinum/Nodes: No mediastinal, hilar or axillary lymphadenopathy. Normal visualized thyroid. Thoracic esophageal course is normal. Lungs/Pleura: Airways are patent. No pleural effusion, lobar consolidation, pneumothorax or pulmonary infarction. Upper Abdomen: Contrast bolus timing is not optimized for evaluation of the abdominal organs. The visualized portions of the organs of the upper abdomen are normal. Musculoskeletal: No chest wall abnormality. No bony spinal canal stenosis. Review of the MIP images confirms the above findings. IMPRESSION: No pulmonary embolus or other acute thoracic abnormality. Electronically Signed   By: Kevin  Herman M.D.   On:  01/11/2021 02:32    EKG EKG Interpretation  Date/Time:  Sunday January 10 2021 19:31:00 EDT Ventricular Rate:  129 PR Interval:  136 QRS Duration: 70 QT Interval:  310 QTC Calculation: 454 R Axis:   -7 Text Interpretation: Sinus tachycardia Minimal voltage criteria for LVH, may be normal variant ( R in aVL ) Confirmed by ,  ([id number]) on 01/11/2021 12:09:15 AM  Radiology DG Chest 2 View  Result Date: 01/10/2021 CLINICAL DATA:  Chest pain EXAM: CHEST - 2 VIEW COMPARISON:  11/24/2020 FINDINGS: The heart size and mediastinal contours are within normal limits. Both lungs are clear. The visualized skeletal structures are unremarkable. IMPRESSION: No active cardiopulmonary disease. Electronically Signed   By: Kevin  Herman M.D.   On: 01/10/2021 20:16     CT Head Wo Contrast  Result Date: 01/11/2021 CLINICAL DATA:  Facial pain EXAM: CT HEAD WITHOUT CONTRAST TECHNIQUE: Contiguous axial images were obtained from the base of the skull through the vertex without intravenous contrast. COMPARISON:  None. FINDINGS: Brain: There is no mass, hemorrhage or extra-axial collection. The size and configuration of the ventricles and extra-axial CSF spaces are normal. 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 Otherwise normal brain parenchyma. Vascular: No abnormal hyperdensity of the major intracranial arteries or dural venous sinuses. No intracranial atherosclerosis. Skull: The visualized skull base, calvarium and extracranial soft tissues are normal. Sinuses/Orbits: No fluid levels or advanced mucosal thickening of the visualized paranasal sinuses. No mastoid or middle ear effusion. The orbits are normal. IMPRESSION: 1. No acute intracranial abnormality. 2. 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 Electronically Signed   By: Ulyses Jarred M.D.   On: 01/11/2021 02:34   CT Angio Chest PE W and/or Wo Contrast  Result Date:  01/11/2021 CLINICAL DATA:  Chest pain and shortness of breath EXAM: CT ANGIOGRAPHY CHEST WITH CONTRAST TECHNIQUE: Multidetector CT imaging of the chest was performed using the standard protocol during bolus administration of intravenous contrast. Multiplanar CT image reconstructions and MIPs were obtained to evaluate the vascular anatomy. CONTRAST:  59m OMNIPAQUE IOHEXOL 350 MG/ML SOLN COMPARISON:  07/28/2020 FINDINGS: Cardiovascular: Contrast injection is sufficient to demonstrate satisfactory opacification of the pulmonary arteries to the segmental level. There is no pulmonary embolus or evidence of right heart strain. The size of the main pulmonary artery is normal. Heart size is normal, with no pericardial effusion. The course and caliber of the aorta are normal. There is no atherosclerotic calcification. Opacification decreased due to pulmonary arterial phase contrast bolus timing. Mediastinum/Nodes: No mediastinal, hilar or axillary lymphadenopathy. Normal visualized thyroid. Thoracic esophageal course is normal. Lungs/Pleura: Airways are patent. No pleural effusion, lobar consolidation, pneumothorax or pulmonary infarction. Upper Abdomen: Contrast bolus timing is not optimized for evaluation of the abdominal organs. The visualized portions of the organs of the upper abdomen are normal. Musculoskeletal: No chest wall abnormality. No bony spinal canal stenosis. Review of the MIP images confirms the above findings. IMPRESSION: No pulmonary embolus or other acute thoracic abnormality. Electronically Signed   By: KUlyses JarredM.D.   On: 01/11/2021 02:32    Procedures Procedures   Medications Ordered in ED Medications  hydrochlorothiazide (HYDRODIURIL) tablet 25 mg (has no administration in time range)  ketorolac (TORADOL) 30 MG/ML injection 15 mg (has no administration in time range)  magnesium sulfate IVPB 2 g 50 mL (0 g Intravenous Stopped 01/11/21 0226)  insulin aspart (novoLOG) injection 6 Units (6  Units Subcutaneous Given 01/11/21 0115)  metoCLOPramide (REGLAN) injection 10 mg (10 mg Intravenous Given 01/11/21 0116)  diphenhydrAMINE (BENADRYL) injection 12.5 mg (12.5 mg Intravenous Given 01/11/21 0116)  warfarin (COUMADIN) tablet 15 mg (15 mg Oral Given 01/11/21 0119)  iohexol (OMNIPAQUE) 350 MG/ML injection 75 mL (75 mLs Intravenous Contrast Given 01/11/21 0208)    ED Course  I have reviewed the triage vital signs and the nursing notes.  Pertinent labs & imaging results that were available during my care of the patient were reviewed by me and considered in my medical decision making (see chart for details).  Ruled out for MI and PE and CHF in the ED.  I suspect this is all related to anxiety.  I have given augmented dose of coumadin and instructed the patient to call PMD in  am for dose adjustment.  Treated for migraine in the ED.  HR is improved with medications.  Stable for discharge with close follow up.  Talk to your doctor regarding your glucose, BP, and blood sodium as well.  Patient verbalizes understanding and agrees to follow up.     [name] was evaluated in Emergency Department on 01/11/2021 for the symptoms described in the history of present illness. She was evaluated in the context of the global COVID-19 pandemic, which necessitated consideration that the patient might be at risk for infection with the SARS-CoV-2 virus that causes COVID-19. Institutional protocols and algorithms that pertain to the evaluation of patients at risk for COVID-19 are in a state of rapid change based on information released by regulatory bodies including the CDC and federal and state organizations. These policies and algorithms were followed during the patient's care in the ED.  Final Clinical Impression(s) / ED Diagnoses Final diagnoses:  Palpitations  Subtherapeutic anticoagulation  Hyperglycemia due to diabetes mellitus (HCC)  Elevated blood pressure reading   Return for intractable cough,  coughing up blood, fevers > 100.4 unrelieved by medication, shortness of breath, intractable vomiting, chest pain, shortness of breath, weakness, numbness, changes in speech, facial asymmetry, abdominal pain, passing out, Inability to tolerate liquids or food, cough, altered mental status or any concerns. No signs of systemic illness or infection. The patient is nontoxic-appearing on exam and vital signs are within normal limits. I have reviewed the triage vital signs and the nursing notes. Pertinent labs & imaging results that were available during my care of the patient were reviewed by me and considered in my medical decision making (see chart for details). After history, exam, and medical workup I feel the patient has been appropriately medically screened and is safe for discharge home. Pertinent diagnoses were discussed with the patient. Patient was given return precautions.  Rx / DC Orders ED Discharge Orders     None        Helen Cuff, MD 01/11/21 3151

## 2021-01-12 ENCOUNTER — Telehealth: Payer: Self-pay

## 2021-01-12 NOTE — Telephone Encounter (Signed)
Transition Care Management Unsuccessful Follow-up Telephone Call  Date of discharge and from where:  01/11/2021 from [facility]  Attempts:  1st Attempt  Reason for unsuccessful TCM follow-up call:  Left voice message

## 2021-01-14 NOTE — Telephone Encounter (Signed)
Transition Care Management Unsuccessful Follow-up Telephone Call  Date of discharge and from where:  01/11/2021 from [facility]  Attempts:  3rd Attempt  Reason for unsuccessful TCM follow-up call:  Unable to reach patient

## 2021-01-15 ENCOUNTER — Ambulatory Visit (INDEPENDENT_AMBULATORY_CARE_PROVIDER_SITE_OTHER): Payer: Medicaid Other | Admitting: Pharmacist

## 2021-01-15 ENCOUNTER — Other Ambulatory Visit: Payer: Self-pay

## 2021-01-15 DIAGNOSIS — Z5181 Encounter for therapeutic drug level monitoring: Secondary | ICD-10-CM

## 2021-01-15 DIAGNOSIS — I480 Paroxysmal atrial fibrillation: Secondary | ICD-10-CM | POA: Diagnosis not present

## 2021-01-15 LAB — POCT INR: INR: 1.6 — AB (ref 2.0–3.0)

## 2021-01-15 NOTE — Patient Instructions (Signed)
Description   Take 2 tablets of warfarin tonight and tomorrow, then start taking 1.5 tablets daily. Recheck INR in 1 week.  [facility] [phone].

## 2021-01-28 NOTE — Addendum Note (Signed)
Encounter addended by: Larey Dresser, MD on: 01/28/2021 2:42 PM  Actions taken: Clinical Note Signed

## 2021-01-28 NOTE — Progress Notes (Signed)
I personally reviewed laboratory data, imaging studies and relevant notes. I independently formulated important aspects of the plan. I have edited the note to reflect any of my changes or salient points. I am in agreement with the plan/findings from the above Advanced Practitioner.

## 2021-01-29 ENCOUNTER — Ambulatory Visit (INDEPENDENT_AMBULATORY_CARE_PROVIDER_SITE_OTHER): Payer: Medicaid Other | Admitting: *Deleted

## 2021-01-29 ENCOUNTER — Other Ambulatory Visit: Payer: Self-pay

## 2021-01-29 DIAGNOSIS — I480 Paroxysmal atrial fibrillation: Secondary | ICD-10-CM | POA: Diagnosis not present

## 2021-01-29 DIAGNOSIS — Z5181 Encounter for therapeutic drug level monitoring: Secondary | ICD-10-CM

## 2021-01-29 LAB — POCT INR: INR: 2.4 (ref 2.0–3.0)

## 2021-01-29 NOTE — Patient Instructions (Signed)
Description   Continue taking Warfarin 1.5 tablets daily. Recheck INR in 2 weeks. [facility]in Clinic [phone].

## 2021-02-12 ENCOUNTER — Ambulatory Visit (INDEPENDENT_AMBULATORY_CARE_PROVIDER_SITE_OTHER): Payer: Medicaid Other

## 2021-02-12 ENCOUNTER — Other Ambulatory Visit: Payer: Self-pay

## 2021-02-12 DIAGNOSIS — I480 Paroxysmal atrial fibrillation: Secondary | ICD-10-CM | POA: Diagnosis not present

## 2021-02-12 DIAGNOSIS — Z5181 Encounter for therapeutic drug level monitoring: Secondary | ICD-10-CM

## 2021-02-12 LAB — POCT INR: INR: 2.5 (ref 2.0–3.0)

## 2021-02-12 NOTE — Patient Instructions (Signed)
Description   Continue taking Warfarin 1.5 tablets daily. Recheck INR in 3 weeks. [facility]madin Clinic [phone].

## 2021-03-05 ENCOUNTER — Other Ambulatory Visit: Payer: Self-pay | Admitting: Family Medicine

## 2021-03-05 ENCOUNTER — Other Ambulatory Visit (HOSPITAL_COMMUNITY): Payer: Self-pay

## 2021-03-05 ENCOUNTER — Ambulatory Visit (INDEPENDENT_AMBULATORY_CARE_PROVIDER_SITE_OTHER): Payer: Medicaid Other | Admitting: *Deleted

## 2021-03-05 ENCOUNTER — Other Ambulatory Visit: Payer: Self-pay

## 2021-03-05 DIAGNOSIS — Z5181 Encounter for therapeutic drug level monitoring: Secondary | ICD-10-CM | POA: Diagnosis not present

## 2021-03-05 DIAGNOSIS — I480 Paroxysmal atrial fibrillation: Secondary | ICD-10-CM | POA: Diagnosis not present

## 2021-03-05 LAB — POCT INR: INR: 2.3 (ref 2.0–3.0)

## 2021-03-05 MED ORDER — HYDROXYZINE HCL 10 MG PO TABS
10.0000 mg | ORAL_TABLET | Freq: Three times a day (TID) | ORAL | 0 refills | Status: DC | PRN
  Filled 2021-03-05: qty 30, 10d supply, fill #0

## 2021-03-05 MED ORDER — WARFARIN SODIUM 7.5 MG PO TABS
ORAL_TABLET | ORAL | 0 refills | Status: DC
  Filled 2021-03-05: qty 55, 33d supply, fill #0

## 2021-03-05 MED ORDER — TRAZODONE HCL 50 MG PO TABS
50.0000 mg | ORAL_TABLET | Freq: Every evening | ORAL | 0 refills | Status: DC | PRN
  Filled 2021-03-05: qty 30, 30d supply, fill #0

## 2021-03-05 NOTE — Patient Instructions (Signed)
Description   Continue taking Warfarin 1.5 tablets daily. Recheck INR in 4 weeks. Coumadin Clinic [phone].

## 2021-03-09 ENCOUNTER — Encounter: Payer: Self-pay | Admitting: General Practice

## 2021-03-09 ENCOUNTER — Ambulatory Visit (INDEPENDENT_AMBULATORY_CARE_PROVIDER_SITE_OTHER): Payer: Medicaid Other | Admitting: General Practice

## 2021-03-09 ENCOUNTER — Other Ambulatory Visit: Payer: Self-pay

## 2021-03-09 VITALS — BP 146/117 | HR 128 | Ht 59.0 in | Wt 179.0 lb

## 2021-03-09 DIAGNOSIS — Z3202 Encounter for pregnancy test, result negative: Secondary | ICD-10-CM | POA: Diagnosis not present

## 2021-03-09 LAB — POCT PREGNANCY, URINE: Preg Test, Ur: NEGATIVE

## 2021-03-09 NOTE — Progress Notes (Signed)
Patient presents to office today for UPT. UPT -. Patient reports faintly positive home test a few days ago. LMP 02/10/21. Patient's blood pressure is elevated today, does take multiple medications for blood pressure and states "it is always high." Advised patient it is too early to test and recommended she repeat home test in 1 week if she hasn't started her period. Also recommended she begin taking PNV. Patient verbalized understanding.  Koren Bound RN BSN 03/09/21

## 2021-03-09 NOTE — Progress Notes (Signed)
Chart reviewed for nurse visit. Agree with plan of care.   Clarnce Flock, MD 03/09/21 5:10 PM

## 2021-03-15 ENCOUNTER — Inpatient Hospital Stay (HOSPITAL_COMMUNITY)
Admission: AD | Admit: 2021-03-15 | Discharge: 2021-03-15 | Disposition: A | Discharge status: Home / Self Care | Payer: Medicaid Other | Attending: Family Medicine | Admitting: Family Medicine

## 2021-03-15 ENCOUNTER — Encounter (HOSPITAL_COMMUNITY): Payer: Self-pay | Admitting: *Deleted

## 2021-03-15 DIAGNOSIS — Z3201 Encounter for pregnancy test, result positive: Secondary | ICD-10-CM | POA: Diagnosis not present

## 2021-03-15 DIAGNOSIS — Z3202 Encounter for pregnancy test, result negative: Secondary | ICD-10-CM

## 2021-03-15 DIAGNOSIS — I10 Essential (primary) hypertension: Secondary | ICD-10-CM | POA: Diagnosis not present

## 2021-03-15 LAB — POCT PREGNANCY, URINE: Preg Test, Ur: NEGATIVE

## 2021-03-15 LAB — HCG, QUANTITATIVE, PREGNANCY: hCG, Beta Chain, Quant, S: 1 m[IU]/mL (ref <5)

## 2021-03-15 NOTE — MAU Note (Signed)
Had a neg and a positive test at home. When she pees sometimes she sees blood, but not not every time.  Hx of miscarriage. Has missed cycle. Denies pain. Has been trying for a baby.

## 2021-03-15 NOTE — Discharge Instructions (Signed)
Please take your Blood Pressure medicine as prescribed.  This is very important for your health and well being. Keep your regular appointments with your primary physician. Advise to get pre- pregnancy counseling.

## 2021-03-15 NOTE — MAU Provider Note (Signed)
Event Date/Time   First Provider Initiated Contact with Patient 03/15/21 1744      S [name] is a 45 y.o. [phone] patient who presents to MAU today with complaint of positive pregnancy test at home.   O BP (!) 170/102 (BP Location: Right Arm) Comment: has not taken BP med today  Pulse (!) 107   Temp 98.5 F (36.9 C) (Oral)   Resp 18   Ht '4\' 11"'$  (1.499 m)   Wt 83 kg   LMP 02/09/2021   SpO2 100%   BMI 36.94 kg/m  Physical Exam Vitals and nursing note reviewed.  Constitutional:      Appearance: Normal appearance.  Cardiovascular:     Rate and Rhythm: Normal rate and regular rhythm.  Pulmonary:     Effort: Pulmonary effort is normal.     Breath sounds: Normal breath sounds.  Neurological:     Mental Status: She is alert.  Psychiatric:        Mood and Affect: Mood normal.        Behavior: Behavior normal.        Thought Content: Thought content normal.   Results for orders placed or performed during the hospital encounter of 03/15/21 (from the past 24 hour(s))  Pregnancy, urine POC     Status: None   Collection Time: 03/15/21  3:18 PM  Result Value Ref Range   Preg Test, Ur NEGATIVE NEGATIVE  hCG, quantitative, pregnancy     Status: None   Collection Time: 03/15/21  3:37 PM  Result Value Ref Range   hCG, Beta Chain, Quant, S 1 <5 mIU/mL     A Medical screening exam complete 1. Negative pregnancy test   2. Primary hypertension      P Discharge from MAU in stable condition Warning signs for worsening condition that would warrant emergency follow-up discussed Patient may return to MAU as needed   Truett Mainland, DO 03/15/2021 5:47 PM

## 2021-04-02 ENCOUNTER — Ambulatory Visit (INDEPENDENT_AMBULATORY_CARE_PROVIDER_SITE_OTHER): Payer: Medicaid Other

## 2021-04-02 ENCOUNTER — Other Ambulatory Visit: Payer: Self-pay

## 2021-04-02 DIAGNOSIS — Z5181 Encounter for therapeutic drug level monitoring: Secondary | ICD-10-CM | POA: Diagnosis not present

## 2021-04-02 DIAGNOSIS — I480 Paroxysmal atrial fibrillation: Secondary | ICD-10-CM

## 2021-04-02 LAB — POCT INR: INR: 2.3 (ref 2.0–3.0)

## 2021-04-02 NOTE — Patient Instructions (Signed)
Description   Continue taking Warfarin 1.5 tablets daily. Recheck INR in 4 weeks. [facility] [phone].

## 2021-04-29 ENCOUNTER — Other Ambulatory Visit (HOSPITAL_COMMUNITY): Payer: Self-pay | Admitting: Cardiology

## 2021-04-29 ENCOUNTER — Other Ambulatory Visit: Payer: Self-pay | Admitting: Family Medicine

## 2021-04-29 ENCOUNTER — Other Ambulatory Visit (HOSPITAL_COMMUNITY): Payer: Self-pay

## 2021-04-29 MED ORDER — TRAZODONE HCL 50 MG PO TABS
50.0000 mg | ORAL_TABLET | Freq: Every evening | ORAL | 0 refills | Status: DC | PRN
  Filled 2021-04-29: qty 30, 30d supply, fill #0

## 2021-04-29 MED ORDER — HYDROXYZINE HCL 10 MG PO TABS
10.0000 mg | ORAL_TABLET | Freq: Three times a day (TID) | ORAL | 0 refills | Status: DC | PRN
  Filled 2021-04-29: qty 30, 10d supply, fill #0

## 2021-04-29 MED FILL — Sertraline HCl Tab 25 MG: ORAL | 30 days supply | Qty: 30 | Fill #1 | Status: CN

## 2021-04-29 NOTE — Telephone Encounter (Signed)
D/C 03/09/21 Completed Course

## 2021-04-30 ENCOUNTER — Ambulatory Visit (INDEPENDENT_AMBULATORY_CARE_PROVIDER_SITE_OTHER): Payer: Medicaid Other

## 2021-04-30 ENCOUNTER — Other Ambulatory Visit: Payer: Self-pay

## 2021-04-30 ENCOUNTER — Other Ambulatory Visit (HOSPITAL_COMMUNITY): Payer: Self-pay

## 2021-04-30 DIAGNOSIS — I480 Paroxysmal atrial fibrillation: Secondary | ICD-10-CM | POA: Diagnosis not present

## 2021-04-30 LAB — POCT INR: INR: 1.8 — AB (ref 2.0–3.0)

## 2021-04-30 MED ORDER — WARFARIN SODIUM 7.5 MG PO TABS
11.2500 mg | ORAL_TABLET | Freq: Every day | ORAL | 1 refills | Status: DC
  Filled 2021-04-30: qty 55, 30d supply, fill #0

## 2021-04-30 NOTE — Patient Instructions (Signed)
Description    Take 2 tablets today and tomorrow, then resume same dosage of Warfarin 1.5 tablets daily. Recheck INR in 2 weeks. [facility] [phone].

## 2021-05-14 ENCOUNTER — Ambulatory Visit (INDEPENDENT_AMBULATORY_CARE_PROVIDER_SITE_OTHER): Payer: Medicaid Other | Admitting: *Deleted

## 2021-05-14 ENCOUNTER — Other Ambulatory Visit: Payer: Self-pay

## 2021-05-14 DIAGNOSIS — I480 Paroxysmal atrial fibrillation: Secondary | ICD-10-CM

## 2021-05-14 DIAGNOSIS — Z5181 Encounter for therapeutic drug level monitoring: Secondary | ICD-10-CM

## 2021-05-14 LAB — POCT INR: INR: 2.4 (ref 2.0–3.0)

## 2021-05-14 NOTE — Patient Instructions (Signed)
Description    Continue same dosage of Warfarin 1.5 tablets daily. Recheck INR in 3 weeks. [facility] Clinic [phone].

## 2021-05-19 ENCOUNTER — Telehealth (HOSPITAL_COMMUNITY): Payer: Self-pay

## 2021-05-19 NOTE — Telephone Encounter (Signed)
Called and left patient a voice message  to confirm/remind patient of their appointment at the [facility] on 05/20/21.   In addition, if she could bring her medications and/or complete list and to give our office a call if needed.

## 2021-05-20 ENCOUNTER — Other Ambulatory Visit (HOSPITAL_COMMUNITY): Payer: Self-pay

## 2021-05-20 ENCOUNTER — Encounter (HOSPITAL_COMMUNITY): Payer: Self-pay

## 2021-05-20 ENCOUNTER — Other Ambulatory Visit: Payer: Self-pay

## 2021-05-20 ENCOUNTER — Ambulatory Visit (HOSPITAL_COMMUNITY)
Admission: RE | Admit: 2021-05-20 | Discharge: 2021-05-20 | Disposition: A | Discharge status: Home / Self Care | Payer: Medicaid Other | Source: Ambulatory Visit | Attending: Cardiology | Admitting: Cardiology

## 2021-05-20 VITALS — BP 150/100 | HR 95 | Wt 185.0 lb

## 2021-05-20 DIAGNOSIS — I5022 Chronic systolic (congestive) heart failure: Secondary | ICD-10-CM | POA: Diagnosis not present

## 2021-05-20 DIAGNOSIS — Z794 Long term (current) use of insulin: Secondary | ICD-10-CM | POA: Diagnosis not present

## 2021-05-20 DIAGNOSIS — I10 Essential (primary) hypertension: Secondary | ICD-10-CM | POA: Diagnosis not present

## 2021-05-20 DIAGNOSIS — I428 Other cardiomyopathies: Secondary | ICD-10-CM | POA: Insufficient documentation

## 2021-05-20 DIAGNOSIS — Z886 Allergy status to analgesic agent status: Secondary | ICD-10-CM | POA: Diagnosis not present

## 2021-05-20 DIAGNOSIS — Z8249 Family history of ischemic heart disease and other diseases of the circulatory system: Secondary | ICD-10-CM | POA: Insufficient documentation

## 2021-05-20 DIAGNOSIS — I5020 Unspecified systolic (congestive) heart failure: Secondary | ICD-10-CM

## 2021-05-20 DIAGNOSIS — I110 Hypertensive heart disease with heart failure: Secondary | ICD-10-CM | POA: Diagnosis not present

## 2021-05-20 DIAGNOSIS — Z7901 Long term (current) use of anticoagulants: Secondary | ICD-10-CM | POA: Insufficient documentation

## 2021-05-20 DIAGNOSIS — Z79899 Other long term (current) drug therapy: Secondary | ICD-10-CM | POA: Insufficient documentation

## 2021-05-20 DIAGNOSIS — E1165 Type 2 diabetes mellitus with hyperglycemia: Secondary | ICD-10-CM | POA: Insufficient documentation

## 2021-05-20 DIAGNOSIS — Z833 Family history of diabetes mellitus: Secondary | ICD-10-CM | POA: Diagnosis not present

## 2021-05-20 DIAGNOSIS — I480 Paroxysmal atrial fibrillation: Secondary | ICD-10-CM | POA: Diagnosis not present

## 2021-05-20 DIAGNOSIS — E785 Hyperlipidemia, unspecified: Secondary | ICD-10-CM | POA: Diagnosis not present

## 2021-05-20 DIAGNOSIS — I2510 Atherosclerotic heart disease of native coronary artery without angina pectoris: Secondary | ICD-10-CM | POA: Insufficient documentation

## 2021-05-20 DIAGNOSIS — B3731 Acute candidiasis of vulva and vagina: Secondary | ICD-10-CM | POA: Diagnosis not present

## 2021-05-20 DIAGNOSIS — Z888 Allergy status to other drugs, medicaments and biological substances status: Secondary | ICD-10-CM | POA: Insufficient documentation

## 2021-05-20 DIAGNOSIS — F419 Anxiety disorder, unspecified: Secondary | ICD-10-CM | POA: Diagnosis not present

## 2021-05-20 DIAGNOSIS — Z7984 Long term (current) use of oral hypoglycemic drugs: Secondary | ICD-10-CM | POA: Diagnosis not present

## 2021-05-20 LAB — BASIC METABOLIC PANEL
Anion gap: 7 (ref 5–15)
BUN: <5 mg/dL — ABNORMAL LOW (ref 6–20)
CO2: 24 mmol/L (ref 22–32)
Calcium: 8.9 mg/dL (ref 8.9–10.3)
Chloride: 100 mmol/L (ref 98–111)
Creatinine, Ser: 0.68 mg/dL (ref 0.44–1.00)
GFR, Estimated: >60 mL/min (ref >60)
Glucose, Bld: 409 mg/dL — ABNORMAL HIGH (ref 70–99)
Potassium: 4.4 mmol/L (ref 3.5–5.1)
Sodium: 131 mmol/L — ABNORMAL LOW (ref 135–145)

## 2021-05-20 LAB — DIGOXIN LEVEL: Digoxin Level: <0.2 ng/mL — ABNORMAL LOW (ref 0.8–2.0)

## 2021-05-20 MED ORDER — FLUCONAZOLE 150 MG PO TABS
150.0000 mg | ORAL_TABLET | Freq: Once | ORAL | 0 refills | Status: AC
  Filled 2021-05-20: qty 1, 1d supply, fill #0

## 2021-05-20 NOTE — Progress Notes (Addendum)
Advanced Heart Failure Clinic Note    PCP: Gifford Shave, MD PCP-Cardiologist: Dr. Aundra Dubin  EP- Dr. Curt Bears   HPI: 45 y.o. with history of type 2 diabetes (poorly controlled, last hgbA1c > 15), poorly controlled HTN, hyperlipidemia, paroxysmal atrial fibrillation, and chronic systolic CHF of uncertain etiology.  Patient had an echo in 6/20 as outpatient due to exertional dyspnea. This showed EF 20-25%, moderate RV dysfunction.  She was also noted to have paroxysmal atrial fibrillation.  She was started on Xarelto.  In 7/20, she was admitted with DKA. In 8/20, she was admitted with CHF exacerbation and diuresed.  She was sent home on Lasix 40 mg po bid.    Readmitted on 9/20 with CHF exacerbation, in the setting of elevated BP.  Diuresed with IV lasix.  LHC showed nonobstructive CAD.  RHC showed R>L heart failure with low PAPI suggesting significant RV dysfunction. She was transitioned to PO torsemide and treated w/ guidelines directed medical therapy w/ Entresto, Spironolactone, Coreg and digoxin. She was also transitioned off of Xarelto and placed on Coumadin.  Readmitted 08/26/1937 with A/C systolic heart failure. She had not been taking her lasix because she says it was on her AVS to stop it.  Diuresed with IV lasix and transitioned to torsemide 40 mg daily. Weight day before discharge was 190 pounds. Echo with LV EF <20%, RV severely decreased systolic function.   Echo in 6/21 showed EF 35%, moderately decreased RV systolic function, moderate LAE, mild MR.  Echo was done today and reviewed, EF 35-40%, global hypokinesis, mild LVH, normal RV, IVC normal. She was also ordered to get a cMRI to look for scar/infiltrative disease and quantify EF. This has not been done yet.   She has had recent issues w/ controlling fluid given intermittent compliance w/ meds, has required adjustments to diuretics. At clinic f/u 2/22, she had stopped taking her medications because she thought she was pregnant.  Urine pregnancy test was negative. Meds restarted. Also w/ poorly controlled DM.   She presents to clinic today for f/u. Missed her last several appts. Not seen since 3/22.   Comes to clinic today w/ complaints of mild increase in exertional dyspnea and mild orthopnea. No resting dyspnea. Reports compliance w/ meds but has not yet taken them this morning b/c she just got off from work. BP is elevated at 150/100. Complains of atypical CP, described and sharp stabbing pain. EKG shows NSR, no ST abnormalities. Of note LHC 03/2019 showed no significant CAD, 95 bpm. She has been fully compliant w/ coumadin.   ReDs clip done today, mildly elevated at 39%.   She also believes she has a vaginal yeast infection. She averages ~2/ per year. We discussed discontinuing Jardiance but she would like to continue and observe for now.    Labs (5/21): hgbA1c 9.3, BNP 76, K 3.9, creatinine 1.1 Labs (6/21): digoxin 0.4, K 4.1, creatinine 0.77 Labs (10/21): BNP 25, K 4.1, creatinine 0.91 Labs (11/21): K 3.7, creatinine 0.6, digoxin <0.2 Labs (12/21): K 4.2, creatinine 0.83 Labs (07/28/2020): K 3.9 Creatinine 0/7  Labs ( 09/03/20): K 3.8 Creatinine 0.5 Glucose 428  Labs 09/10/20: Pregnancy Test negative.  Labs (6/22): SCr 0.93, K 4.2   PMH: 1. Type 2 diabetes: H/o DKA 2. HTN 3. OSA 4. Obesity 5. Polycystic ovarian syndrome 6. Chronic systolic CHF: Nonischemic cardiomyopathy - Echo (6/20) with EF 20-25%, moderate RV dysfunction.  - LHC/RHC (9/20): No significant CAD.  - Echo (3/21): EF <20%, moderate LVH with global  HK, severely reduced RV systolic function.  - Echo (6/21): EF 35%, moderately decreased RV systolic function, moderate LAE, mild MR. - Echo (11/21): EF 35-40%, global hypokinesis, mild LVH, normal RV, IVC normal.  7. Hyperlipidemia 8. Atrial fibrillation: Paroxysmal.  9. History of LV thrombus.    Current Outpatient Medications  Medication Sig Dispense Refill   Accu-Chek Softclix Lancets  lancets Use as instructed 100 each 12   albuterol (VENTOLIN HFA) 108 (90 Base) MCG/ACT inhaler INHALE 2 PUFFS INTO THE LUNGS EVERY 6 (SIX) HOURS AS NEEDED FOR WHEEZING OR SHORTNESS OF BREATH. 8.5 g 1   blood glucose meter kit and supplies KIT Dispense based on patient and insurance preference. Use up to four times daily as directed. (FOR ICD-9 250.00, 250.01). 1 each 0   Blood Glucose Monitoring Suppl (ACCU-CHEK AVIVA PLUS) w/Device KIT 1 application by Does not apply route QID. Check fasting sugars as well as throughout the day 1 kit 0   busPIRone (BUSPAR) 7.5 MG tablet Take 7.5 mg by mouth 2 (two) times daily.     digoxin (LANOXIN) 0.125 MG tablet Take 1 tablet (0.125 mg total) by mouth daily. 30 tablet 3   empagliflozin (JARDIANCE) 25 MG TABS tablet Take 25 mg by mouth daily. 90 tablet 3   gabapentin (NEURONTIN) 100 MG capsule Take 100 mg by mouth 2 (two) times daily.     glucose blood (ACCU-CHEK AVIVA PLUS) test strip Check CBG 2 times per day 100 each 12   glucose blood (AGAMATRIX PRESTO TEST) test strip Use as instructed 100 each 12   hydrOXYzine (ATARAX/VISTARIL) 10 MG tablet Take 1 tablet (10 mg total) by mouth 3 (three) times daily as needed. 30 tablet 0   insulin aspart protamine- aspart (NOVOLOG MIX 70/30) (70-30) 100 UNIT/ML injection Inject 0.15 mLs (15 Units total) into the skin 2 (two) times daily with a meal. 10 mL 11   Insulin Syringe-Needle U-100 (INSULIN SYRINGE .3CC/31GX5/16") 31G X 5/16" 0.3 ML MISC 15 Units by Does not apply route 2 (two) times daily. 100 each 3   isosorbide-hydrALAZINE (BIDIL) 20-37.5 MG tablet TAKE 0.5 TABLETS BY MOUTH 3 (THREE) TIMES DAILY. 135 tablet 3   ivabradine (CORLANOR) 5 MG TABS tablet TAKE 1 TABLET (5 MG TOTAL) BY MOUTH 2 (TWO) TIMES DAILY WITH A MEAL. 60 tablet 3   Lancets Misc. (ACCU-CHEK SOFTCLIX LANCET DEV) KIT 1 application by Does not apply route 2 (two) times daily. 1 kit 0   meclizine (ANTIVERT) 12.5 MG tablet Take 1 tablet (12.5 mg total) by  mouth 3 (three) times daily as needed for dizziness. 30 tablet 0   potassium chloride SA (KLOR-CON) 20 MEQ tablet Take 20 mEq by mouth See admin instructions. Take 1 tablet by mouth every Monday Wednesday and Friday     sacubitril-valsartan (ENTRESTO) 97-103 MG TAKE 1 TABLET BY MOUTH 2 (TWO) TIMES DAILY. 60 tablet 3   sertraline (ZOLOFT) 25 MG tablet TAKE 1 TABLET (25 MG TOTAL) BY MOUTH DAILY. 30 tablet 3   spironolactone (ALDACTONE) 25 MG tablet Take 25 mg by mouth daily.     torsemide (DEMADEX) 20 MG tablet Take 5 tablets (100 mg total) by mouth daily.     traZODone (DESYREL) 50 MG tablet Take 1 tablet (50 mg total) by mouth at bedtime as needed for sleep. 30 tablet 0   warfarin (COUMADIN) 7.5 MG tablet Take 1.5 tablets (11.25 mg total) by mouth daily as directed by the coumadin clinic 55 tablet 1   atorvastatin (LIPITOR)  40 MG tablet TAKE 1 TABLET BY MOUTH DAILY AT 6 PM. 30 tablet 2   carvedilol (COREG) 25 MG tablet TAKE 1 TABLET (25 MG TOTAL) BY MOUTH 2 (TWO) TIMES DAILY WITH A MEAL. 180 tablet 3   No current facility-administered medications for this encounter.    Allergies  Allergen Reactions   Shellfish Allergy Swelling    Airway involvement including EMS - Has Epi-Pen   Dulaglutide Nausea And Vomiting and Other (See Comments)    Multiple episodes of vomiting over multiple days   Metformin And Related Diarrhea   Ibuprofen     Per PCP interferes with daily meds       Social History   Socioeconomic History   Marital status: Single    Spouse name: Not on file   Number of children: Not on file   Years of education: Not on file   Highest education level: Not on file  Occupational History   Not on file  Tobacco Use   Smoking status: Never   Smokeless tobacco: Never  Vaping Use   Vaping Use: Never used  Substance and Sexual Activity   Alcohol use: No    Alcohol/week: 0.0 standard drinks   Drug use: No   Sexual activity: Yes  Other Topics Concern   Not on file  Social  History Narrative   Not on file   Social Determinants of Health   Financial Resource Strain: Not on file  Food Insecurity: No Food Insecurity   Worried About Running Out of Food in the Last Year: Never true   Ran Out of Food in the Last Year: Never true  Transportation Needs: No Transportation Needs   Lack of Transportation (Medical): No   Lack of Transportation (Non-Medical): No  Physical Activity: Insufficiently Active   Days of Exercise per Week: 7 days   Minutes of Exercise per Session: 20 min  Stress: Stress Concern Present   Feeling of Stress : Very much  Social Connections: Not on file  Intimate Partner Violence: Not At Risk   Fear of Current or Ex-Partner: No   Emotionally Abused: No   Physically Abused: No   Sexually Abused: No      Family History  Problem Relation Age of Onset   Diabetes Mother    Hypertension Mother    Hypertension Father    Diabetes Father     Vitals:   05/20/21 0941  BP: (!) 150/100  Pulse: 95  SpO2: 100%  Weight: 83.9 kg (185 lb)    Wt Readings from Last 3 Encounters:  05/20/21 83.9 kg (185 lb)  03/15/21 83 kg (182 lb 14.4 oz)  03/09/21 81.2 kg (179 lb)   PHYSICAL EXAM: ReDs Clip 39%  General:  Well appearing. No respiratory difficulty HEENT: normal Neck: supple. JVD 8 cm. Carotids 2+ bilat; no bruits. No lymphadenopathy or thyromegaly appreciated. Cor: PMI nondisplaced. Regular rate & rhythm. No rubs, gallops or murmurs. Lungs: clear Abdomen: soft, nontender, nondistended. No hepatosplenomegaly. No bruits or masses. Good bowel sounds. Extremities: no cyanosis, clubbing, rash, edema Neuro: alert & oriented x 3, cranial nerves grossly intact. moves all 4 extremities w/o difficulty. Affect pleasant.   ASSESSMENT & PLAN:  1.Chronic systolic CHF:  Nonischemic cardiomyopathy, diagnosed in 6/20. Repeat Echo 03/2019 showed EF 20-25%, diffuse hypokinesis, LV thrombus, moderate RV systolic dysfunction, mild MR, moderate TR. Echo in  3/21 with EF <30%, severe RV dysfunction.  She has a history of paroxysmal atrial fibrillation but seems to be mostly in  sinus rhythm.  This is not likely to be a tachycardia-mediated car%.diomyopathy.  Poorly controlled diabetes itself can cause a cardiomyopathy, as can poorly controlled HTN.  Myocarditis is a potential cause of her cardiomyopathy. [place] 9/20 showed nonobstructive CAD. RHC showed R>L heart failure with low PAPI suggesting significant RV dysfunction.  Echo in 6/21 with EF up to 35%. Echo 11/21 EF 35-40% Garde I DD - NYHA II. Volume status mildly elevated, ReDs Clip 39%  - Increase Torsemide to 100 mg qam/ 50 mg qpm x 2 days then return to 100 mg daily - Continue Entresto 97/103 bid.  - Continue carvedilol 25 mg bid. - Continue ivabradine 5 mg bid.  - Continue spironolactone 25 mg daily.  - Continue digoxin 0.125 mg daily. Check Dig level today  - Continue empagliflozin 25 mg daily   - Continue bidil to 1/2 tab tid  - Check BMP today  - She previously had been trying to get pregnant (has been difficult with polycystic ovarian syndrome).  She has 2 grown children already. With her cardiomyopathy and active CHF, pregnancy would create a significant mortality risk for her.  A number of her cardiac meds to control her CHF are feto-toxic. Discussed importance of strict regular birth control/ use of condoms to avoid pregnancy    2. LV thrombus - LV thrombus noted on echo 9/20.   - on coumadin  - INRs followed by [facility]   3. Atrial fibrillation, Paroxysmal.  - EKG today shows NSR  - Continue warfarin (using warfarin rather than DOAC with h/o LV thrombus).  - Would consider atrial fibrillation ablation in the future, if recurrence, given CASTLE-HF data.   4. Type 2 diabetes  - poorly controlled, most recent Hgb A1c 14  - insuline per PCP - c/w Jardiance  5. HTN  - elevated today 150/100 but has not yet taken AM meds  - advised to continue w/ strict  compliance - BMP today  - f/u w/ pharmD in 3 weeks (instructed to take meds prior to visit)   6. Vaginal Yeast Infection  -  We discussed discontinuing Jardiance but she would like to continue and observe for now.  - Treat w/ Diflucan 150 mg x 1    F/u w/ Pharm D in 2-3 weeks. F/u w/ APP in 2 months   Lyda Jester, PA-C 05/20/21

## 2021-05-20 NOTE — Patient Instructions (Signed)
INCREASE Torsemide to 100 mg in the AM and 50 mg in the PM for 2 days, then resume normal dose of 100 mg daily thereafter  Labs today We will only contact you if something comes back abnormal or we need to make some changes. Otherwise no news is good news!  Your physician recommends that you schedule a follow-up appointment in: 3 weeks with the pharmacy team and in 2 months  in the Advanced Practitioners (PA/NP) Clinic    Do the following things EVERYDAY: Weigh yourself in the morning before breakfast. Write it down and keep it in a log. Take your medicines as prescribed Eat low salt foods--Limit salt (sodium) to 2000 mg per day.  Stay as active as you can everyday Limit all fluids for the day to less than 2 liters   At the [facility], you and your health needs are our priority. As part of our continuing mission to provide you with exceptional heart care, we have created designated Provider Care Teams. These Care Teams include your primary Cardiologist (physician) and Advanced Practice Providers (APPs- Physician Assistants and Nurse Practitioners) who all work together to provide you with the care you need, when you need it.   You may see any of the following providers on your designated Care Team at your next follow up: Dr Glori Bickers Dr Haynes Kerns, NP Lyda Jester, [place] [facility] [place], [place] [name], PharmD   Please be sure to bring in all your medications bottles to every appointment.

## 2021-06-01 ENCOUNTER — Other Ambulatory Visit (HOSPITAL_COMMUNITY): Payer: Self-pay

## 2021-06-03 ENCOUNTER — Inpatient Hospital Stay (HOSPITAL_COMMUNITY): Admission: RE | Admit: 2021-06-03 | Payer: Medicaid Other | Source: Ambulatory Visit

## 2021-06-09 ENCOUNTER — Other Ambulatory Visit: Payer: Self-pay

## 2021-06-09 ENCOUNTER — Ambulatory Visit (INDEPENDENT_AMBULATORY_CARE_PROVIDER_SITE_OTHER): Payer: Medicaid Other | Admitting: *Deleted

## 2021-06-09 DIAGNOSIS — I480 Paroxysmal atrial fibrillation: Secondary | ICD-10-CM | POA: Diagnosis not present

## 2021-06-09 DIAGNOSIS — Z5181 Encounter for therapeutic drug level monitoring: Secondary | ICD-10-CM

## 2021-06-09 LAB — POCT INR: INR: 1.3 — AB (ref 2.0–3.0)

## 2021-06-09 NOTE — Patient Instructions (Signed)
Description   Today and tomorrow take 2 tablets then continue same dosage of Warfarin 1.5 tablets daily. Recheck INR in 1 week. Coumadin Clinic [phone].

## 2021-06-24 ENCOUNTER — Other Ambulatory Visit: Payer: Self-pay

## 2021-06-24 ENCOUNTER — Ambulatory Visit (INDEPENDENT_AMBULATORY_CARE_PROVIDER_SITE_OTHER): Payer: Medicaid Other | Admitting: *Deleted

## 2021-06-24 ENCOUNTER — Other Ambulatory Visit: Payer: Self-pay | Admitting: Family Medicine

## 2021-06-24 ENCOUNTER — Other Ambulatory Visit (HOSPITAL_COMMUNITY): Payer: Self-pay

## 2021-06-24 ENCOUNTER — Other Ambulatory Visit (HOSPITAL_COMMUNITY): Payer: Self-pay | Admitting: Cardiology

## 2021-06-24 DIAGNOSIS — I480 Paroxysmal atrial fibrillation: Secondary | ICD-10-CM | POA: Diagnosis not present

## 2021-06-24 DIAGNOSIS — Z5181 Encounter for therapeutic drug level monitoring: Secondary | ICD-10-CM

## 2021-06-24 LAB — POCT INR: INR: 1.3 — AB (ref 2.0–3.0)

## 2021-06-24 MED ORDER — WARFARIN SODIUM 7.5 MG PO TABS
11.2500 mg | ORAL_TABLET | Freq: Every day | ORAL | 1 refills | Status: DC
  Filled 2021-06-24: qty 50, 30d supply, fill #0

## 2021-06-24 MED ORDER — HYDROXYZINE HCL 10 MG PO TABS
10.0000 mg | ORAL_TABLET | Freq: Three times a day (TID) | ORAL | 0 refills | Status: DC | PRN
  Filled 2021-06-24: qty 30, 10d supply, fill #0

## 2021-06-24 MED ORDER — TRAZODONE HCL 50 MG PO TABS
50.0000 mg | ORAL_TABLET | Freq: Every evening | ORAL | 0 refills | Status: DC | PRN
Start: 2021-06-24 — End: 2021-08-10
  Filled 2021-06-24: qty 30, 30d supply, fill #0

## 2021-06-24 NOTE — Patient Instructions (Signed)
Description   Today and tomorrow take 2 tablets then continue same dosage of Warfarin 1.5 tablets daily. Recheck INR in 1 week. Coumadin Clinic [phone].

## 2021-07-02 ENCOUNTER — Ambulatory Visit (INDEPENDENT_AMBULATORY_CARE_PROVIDER_SITE_OTHER): Payer: Medicaid Other | Admitting: *Deleted

## 2021-07-02 ENCOUNTER — Other Ambulatory Visit: Payer: Self-pay

## 2021-07-02 DIAGNOSIS — Z5181 Encounter for therapeutic drug level monitoring: Secondary | ICD-10-CM

## 2021-07-02 DIAGNOSIS — I480 Paroxysmal atrial fibrillation: Secondary | ICD-10-CM | POA: Diagnosis not present

## 2021-07-02 LAB — POCT INR: INR: 2.8 (ref 2.0–3.0)

## 2021-07-02 NOTE — Patient Instructions (Signed)
Description   Continue taking Warfarin 1.5 tablets daily. Recheck INR in 3 weeks. [facility]madin Clinic [phone].

## 2021-07-22 ENCOUNTER — Telehealth (HOSPITAL_COMMUNITY): Payer: Self-pay

## 2021-07-22 NOTE — Telephone Encounter (Signed)
Called and left patient a detailed message to confirm/remind patient of their appointment at the [facility] on 07/23/21.   I also asked if patient could bring all medications and/or complete list and to give our office a call back if needed.

## 2021-07-23 ENCOUNTER — Encounter (HOSPITAL_COMMUNITY): Payer: Self-pay

## 2021-07-23 ENCOUNTER — Other Ambulatory Visit: Payer: Self-pay

## 2021-07-23 ENCOUNTER — Ambulatory Visit (HOSPITAL_COMMUNITY)
Admission: RE | Admit: 2021-07-23 | Discharge: 2021-07-23 | Disposition: A | Discharge status: Home / Self Care | Payer: Medicaid Other | Source: Ambulatory Visit | Attending: Cardiology | Admitting: Cardiology

## 2021-07-23 VITALS — BP 140/100 | HR 104 | Ht 59.0 in | Wt 182.0 lb

## 2021-07-23 DIAGNOSIS — Z7984 Long term (current) use of oral hypoglycemic drugs: Secondary | ICD-10-CM | POA: Insufficient documentation

## 2021-07-23 DIAGNOSIS — F4321 Adjustment disorder with depressed mood: Secondary | ICD-10-CM | POA: Insufficient documentation

## 2021-07-23 DIAGNOSIS — I480 Paroxysmal atrial fibrillation: Secondary | ICD-10-CM | POA: Diagnosis not present

## 2021-07-23 DIAGNOSIS — Z794 Long term (current) use of insulin: Secondary | ICD-10-CM | POA: Insufficient documentation

## 2021-07-23 DIAGNOSIS — I2510 Atherosclerotic heart disease of native coronary artery without angina pectoris: Secondary | ICD-10-CM | POA: Diagnosis not present

## 2021-07-23 DIAGNOSIS — I5020 Unspecified systolic (congestive) heart failure: Secondary | ICD-10-CM

## 2021-07-23 DIAGNOSIS — Z7901 Long term (current) use of anticoagulants: Secondary | ICD-10-CM | POA: Insufficient documentation

## 2021-07-23 DIAGNOSIS — E282 Polycystic ovarian syndrome: Secondary | ICD-10-CM | POA: Diagnosis not present

## 2021-07-23 DIAGNOSIS — I5022 Chronic systolic (congestive) heart failure: Secondary | ICD-10-CM | POA: Insufficient documentation

## 2021-07-23 DIAGNOSIS — E1110 Type 2 diabetes mellitus with ketoacidosis without coma: Secondary | ICD-10-CM | POA: Insufficient documentation

## 2021-07-23 DIAGNOSIS — Z5941 Food insecurity: Secondary | ICD-10-CM | POA: Diagnosis not present

## 2021-07-23 DIAGNOSIS — Z9114 Patient's other noncompliance with medication regimen: Secondary | ICD-10-CM | POA: Insufficient documentation

## 2021-07-23 DIAGNOSIS — Z79899 Other long term (current) drug therapy: Secondary | ICD-10-CM | POA: Diagnosis not present

## 2021-07-23 DIAGNOSIS — E785 Hyperlipidemia, unspecified: Secondary | ICD-10-CM | POA: Diagnosis not present

## 2021-07-23 DIAGNOSIS — I428 Other cardiomyopathies: Secondary | ICD-10-CM | POA: Insufficient documentation

## 2021-07-23 DIAGNOSIS — I110 Hypertensive heart disease with heart failure: Secondary | ICD-10-CM | POA: Insufficient documentation

## 2021-07-23 DIAGNOSIS — Z630 Problems in relationship with spouse or partner: Secondary | ICD-10-CM | POA: Diagnosis not present

## 2021-07-23 DIAGNOSIS — Z560 Unemployment, unspecified: Secondary | ICD-10-CM | POA: Diagnosis not present

## 2021-07-23 LAB — BASIC METABOLIC PANEL
Anion gap: 9 (ref 5–15)
BUN: 9 mg/dL (ref 6–20)
CO2: 22 mmol/L (ref 22–32)
Calcium: 8.9 mg/dL (ref 8.9–10.3)
Chloride: 100 mmol/L (ref 98–111)
Creatinine, Ser: 0.66 mg/dL (ref 0.44–1.00)
GFR, Estimated: >60 mL/min (ref >60)
Glucose, Bld: 391 mg/dL — ABNORMAL HIGH (ref 70–99)
Potassium: 4.4 mmol/L (ref 3.5–5.1)
Sodium: 131 mmol/L — ABNORMAL LOW (ref 135–145)

## 2021-07-23 LAB — DIGOXIN LEVEL: Digoxin Level: <0.2 ng/mL — ABNORMAL LOW (ref 0.8–2.0)
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
## 2021-07-23 NOTE — Patient Instructions (Signed)
It was great to see you today! No medication changes are needed at this time.  Labs today We will only contact you if something comes back abnormal or we need to make some changes. Otherwise no news is good news!  Your physician recommends that you schedule a follow-up appointment in: 4-6 weeks  in the Advanced Practitioners (PA/NP) Clinic    Do the following things EVERYDAY: Weigh yourself in the morning before breakfast. Write it down and keep it in a log. Take your medicines as prescribed Eat low salt foods--Limit salt (sodium) to 2000 mg per day.  Stay as active as you can everyday Limit all fluids for the day to less than 2 liters  At the [facility], you and your health needs are our priority. As part of our continuing mission to provide you with exceptional heart care, we have created designated Provider Care Teams. These Care Teams include your primary Cardiologist (physician) and Advanced Practice Providers (APPs- Physician Assistants and Nurse Practitioners) who all work together to provide you with the care you need, when you need it.   You may see any of the following providers on your designated Care Team at your next follow up: Dr Glori Bickers Dr Haynes Kerns, NP Lyda Jester, [place] [facility] [place], [place] [name], PharmD   Please be sure to bring in all your medications bottles to every appointment.
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
## 2021-07-23 NOTE — Progress Notes (Signed)
ReDS Vest / Clip - 07/23/21 1147       ReDS Vest / Clip   Station Marker A    Ruler Value 30    ReDS Value Range Low volume    ReDS Actual Value 35

## 2021-08-10 ENCOUNTER — Other Ambulatory Visit (HOSPITAL_COMMUNITY): Payer: Self-pay | Admitting: Cardiology

## 2021-08-10 ENCOUNTER — Other Ambulatory Visit (HOSPITAL_COMMUNITY): Payer: Self-pay

## 2021-08-10 ENCOUNTER — Other Ambulatory Visit: Payer: Self-pay | Admitting: Family Medicine

## 2021-08-10 ENCOUNTER — Telehealth: Payer: Self-pay

## 2021-08-10 MED ORDER — TRAZODONE HCL 50 MG PO TABS
50.0000 mg | ORAL_TABLET | Freq: Every evening | ORAL | 0 refills | Status: DC | PRN
Start: 2021-08-10 — End: 2021-10-19
  Filled 2021-08-10: qty 30, 30d supply, fill #0

## 2021-08-10 MED ORDER — HYDROXYZINE HCL 10 MG PO TABS
10.0000 mg | ORAL_TABLET | Freq: Three times a day (TID) | ORAL | 0 refills | Status: DC | PRN
  Filled 2021-08-10: qty 30, 10d supply, fill #0

## 2021-08-10 MED ORDER — SERTRALINE HCL 25 MG PO TABS
25.0000 mg | ORAL_TABLET | Freq: Every day | ORAL | 3 refills | Status: DC
  Filled 2021-08-10: qty 30, 30d supply, fill #0

## 2021-08-10 MED ORDER — INSULIN ASPART PROT & ASPART (70-30 MIX) 100 UNIT/ML ~~LOC~~ SUSP
22.0000 [IU] | Freq: Two times a day (BID) | SUBCUTANEOUS | 11 refills | Status: DC
  Filled 2021-08-10 – 2021-08-11 (×3): qty 10, 23d supply, fill #0

## 2021-08-10 NOTE — Telephone Encounter (Signed)
Patient calls nurse line requesting refill on Novolog. However, patient reports that she takes Novolog 22 units. Current rx is written for 15 units. Please advise if new, updated rx can be sent to [facility].   Talbot Grumbling, RN

## 2021-08-11 ENCOUNTER — Other Ambulatory Visit (HOSPITAL_COMMUNITY): Payer: Self-pay

## 2021-08-12 ENCOUNTER — Other Ambulatory Visit (HOSPITAL_COMMUNITY): Payer: Self-pay

## 2021-08-13 ENCOUNTER — Other Ambulatory Visit (HOSPITAL_COMMUNITY): Payer: Self-pay

## 2021-08-26 ENCOUNTER — Telehealth (HOSPITAL_COMMUNITY): Payer: Self-pay

## 2021-08-26 NOTE — Telephone Encounter (Signed)
Called to confirm/remind patient of their appointment at the [facility] on 08/27/21.   Patient reminded to bring all medications and/or complete list.  Confirmed patient has transportation. Gave directions, instructed to utilize [place] parking.  Confirmed appointment prior to ending call.

## 2021-08-27 ENCOUNTER — Encounter (HOSPITAL_COMMUNITY): Payer: Self-pay

## 2021-08-27 ENCOUNTER — Other Ambulatory Visit (HOSPITAL_COMMUNITY): Payer: Self-pay

## 2021-08-27 ENCOUNTER — Ambulatory Visit (HOSPITAL_COMMUNITY)
Admission: RE | Admit: 2021-08-27 | Discharge: 2021-08-27 | Disposition: A | Discharge status: Home / Self Care | Payer: Medicaid Other | Source: Ambulatory Visit | Attending: Family Medicine | Admitting: Family Medicine

## 2021-08-27 ENCOUNTER — Ambulatory Visit (INDEPENDENT_AMBULATORY_CARE_PROVIDER_SITE_OTHER): Payer: Self-pay | Admitting: *Deleted

## 2021-08-27 ENCOUNTER — Other Ambulatory Visit: Payer: Self-pay

## 2021-08-27 VITALS — BP 162/138 | HR 105 | Wt 183.6 lb

## 2021-08-27 DIAGNOSIS — F4321 Adjustment disorder with depressed mood: Secondary | ICD-10-CM | POA: Diagnosis not present

## 2021-08-27 DIAGNOSIS — E1110 Type 2 diabetes mellitus with ketoacidosis without coma: Secondary | ICD-10-CM | POA: Insufficient documentation

## 2021-08-27 DIAGNOSIS — I513 Intracardiac thrombosis, not elsewhere classified: Secondary | ICD-10-CM

## 2021-08-27 DIAGNOSIS — I428 Other cardiomyopathies: Secondary | ICD-10-CM | POA: Diagnosis not present

## 2021-08-27 DIAGNOSIS — Z7901 Long term (current) use of anticoagulants: Secondary | ICD-10-CM | POA: Insufficient documentation

## 2021-08-27 DIAGNOSIS — Z7984 Long term (current) use of oral hypoglycemic drugs: Secondary | ICD-10-CM | POA: Insufficient documentation

## 2021-08-27 DIAGNOSIS — E0821 Diabetes mellitus due to underlying condition with diabetic nephropathy: Secondary | ICD-10-CM | POA: Diagnosis not present

## 2021-08-27 DIAGNOSIS — Z794 Long term (current) use of insulin: Secondary | ICD-10-CM | POA: Diagnosis not present

## 2021-08-27 DIAGNOSIS — I5022 Chronic systolic (congestive) heart failure: Secondary | ICD-10-CM | POA: Insufficient documentation

## 2021-08-27 DIAGNOSIS — Z5982 Transportation insecurity: Secondary | ICD-10-CM | POA: Insufficient documentation

## 2021-08-27 DIAGNOSIS — I480 Paroxysmal atrial fibrillation: Secondary | ICD-10-CM | POA: Insufficient documentation

## 2021-08-27 DIAGNOSIS — E785 Hyperlipidemia, unspecified: Secondary | ICD-10-CM | POA: Diagnosis not present

## 2021-08-27 DIAGNOSIS — I5020 Unspecified systolic (congestive) heart failure: Secondary | ICD-10-CM

## 2021-08-27 DIAGNOSIS — Z87440 Personal history of urinary (tract) infections: Secondary | ICD-10-CM | POA: Diagnosis not present

## 2021-08-27 DIAGNOSIS — Z79899 Other long term (current) drug therapy: Secondary | ICD-10-CM | POA: Diagnosis not present

## 2021-08-27 DIAGNOSIS — G43909 Migraine, unspecified, not intractable, without status migrainosus: Secondary | ICD-10-CM | POA: Diagnosis not present

## 2021-08-27 DIAGNOSIS — Z86718 Personal history of other venous thrombosis and embolism: Secondary | ICD-10-CM | POA: Diagnosis not present

## 2021-08-27 DIAGNOSIS — Z6837 Body mass index (BMI) 37.0-37.9, adult: Secondary | ICD-10-CM | POA: Diagnosis not present

## 2021-08-27 DIAGNOSIS — E1165 Type 2 diabetes mellitus with hyperglycemia: Secondary | ICD-10-CM | POA: Diagnosis not present

## 2021-08-27 DIAGNOSIS — Z5181 Encounter for therapeutic drug level monitoring: Secondary | ICD-10-CM

## 2021-08-27 DIAGNOSIS — I110 Hypertensive heart disease with heart failure: Secondary | ICD-10-CM | POA: Insufficient documentation

## 2021-08-27 DIAGNOSIS — Z139 Encounter for screening, unspecified: Secondary | ICD-10-CM

## 2021-08-27 DIAGNOSIS — I2510 Atherosclerotic heart disease of native coronary artery without angina pectoris: Secondary | ICD-10-CM | POA: Diagnosis not present

## 2021-08-27 DIAGNOSIS — E669 Obesity, unspecified: Secondary | ICD-10-CM | POA: Insufficient documentation

## 2021-08-27 DIAGNOSIS — F32A Depression, unspecified: Secondary | ICD-10-CM

## 2021-08-27 DIAGNOSIS — I10 Essential (primary) hypertension: Secondary | ICD-10-CM

## 2021-08-27 LAB — BASIC METABOLIC PANEL
Anion gap: 10 (ref 5–15)
BUN: 7 mg/dL (ref 6–20)
CO2: 19 mmol/L — ABNORMAL LOW (ref 22–32)
Calcium: 8.8 mg/dL — ABNORMAL LOW (ref 8.9–10.3)
Chloride: 101 mmol/L (ref 98–111)
Creatinine, Ser: 0.7 mg/dL (ref 0.44–1.00)
GFR, Estimated: >60 mL/min (ref >60)
Glucose, Bld: 313 mg/dL — ABNORMAL HIGH (ref 70–99)
Potassium: 4.1 mmol/L (ref 3.5–5.1)
Sodium: 130 mmol/L — ABNORMAL LOW (ref 135–145)

## 2021-08-27 LAB — DIGOXIN LEVEL: Digoxin Level: 0.3 ng/mL — ABNORMAL LOW (ref 0.8–2.0)

## 2021-08-27 LAB — PROTIME-INR
INR: 1 (ref 0.8–1.2)
Prothrombin Time: 13.1 seconds (ref 11.4–15.2)

## 2021-08-27 MED ORDER — HYDRALAZINE HCL 50 MG PO TABS
50.0000 mg | ORAL_TABLET | Freq: Three times a day (TID) | ORAL | 3 refills | Status: DC
  Filled 2021-08-27: qty 90, 30d supply, fill #0

## 2021-08-27 MED ORDER — WARFARIN SODIUM 7.5 MG PO TABS
ORAL_TABLET | ORAL | 1 refills | Status: DC
  Filled 2021-08-27: qty 39, 30d supply, fill #0

## 2021-08-27 NOTE — Progress Notes (Signed)
Heart and Vascular Care Navigation  08/27/2021  CAPRIA CARTAYA June 23, 1976 [id number]  Reason for Referral: concerns with transportation and mental health concerns   Engaged with patient face to face for follow up visit for Heart and Vascular Care Coordination.                                                                                                   Assessment:      CSW met with pt to discuss above concerns.  Pt reports that she was told by [facility] that they will no longer be available to help with rides so is worried how she will get to future appts.  Pt has active Medicaid so CSW informed her of her Medicaid transportation benefit.  Pt states she never received Medicaid card so unsure what kind of Medicaid she has.  CSW able to look up through [facility] and confirm pt has [facility] Medicaid- provided pt with their customer service number to request another card and their transport number so she can set up rides to future appts.  CSW then checked in regarding pt mental health.  Reports she just feels stuck in [place] which is hard on her mentally- wanting to take a trip but is trapped due to lack of a car.  CSW encouraged pt to think of alternative ways to have an escape such as a local day trip or home coping mechanisms such as meditation/ self care.  Brought up getting established with a therapist which CSW has discussed with pt in the past but she remains uninterested in getting established at this time.                                HRT/VAS Care Coordination     Patients Home Cardiology Office Heart Failure Clinic   Outpatient Care Team [facility]   PCP Name: [place] Paramedic Name: [facility] 09/24/20 no longer benefiting from services   Social Worker Name: Tammy Sours [phone]   Living arrangements for the past 2 months Apartment   Lives with: Self   Patient Current Insurance Coverage Medicaid   Patient Has Concern With Paying Medical Bills No    Patient Concerns With Medical Bills none- has Medicaid   Medical Bill Referrals: Medicaid/Disability   Does Patient Have Prescription Coverage? Yes   Patient Prescription Assistance Programs Patient Assistance Programs   Patient Assistance Programs Medications Jardiance through [place] and [place] until 10/20/20, Delene Loll through [employer] until 10/24/20   Home Assistive Devices/Equipment Eyeglasses; CBG Meter   DME Agency NA   [facility]       Social History:  SDOH Screenings   Alcohol Screen: Not on file  Depression (PHQ2-9): Not on file  Financial Resource Strain: Not on file  Food Insecurity: Not on file  Housing: Not on file  Physical Activity: Not on file  Social Connections: Not on file  Stress: Not on file  Tobacco Use: Low Risk    Smoking Tobacco Use: Never   Smokeless Tobacco Use: Never   Passive Exposure: Not on file  Transportation Needs: Unmet Transportation Needs   Lack of Transportation (Medical): Yes   Lack of Transportation (Non-Medical): No    SDOH Interventions: Financial Resources:    No intervention needed  Food Insecurity:   No intervention needed  Housing Insecurity:   No intervention needed- has section 8 housing  Transportation:   Transportation Interventions: Other (Comment) (Medicaid transport)     Follow-up plan:     Pt to follow up with Medicaid transport for future appts- will reach out to clinic if she decides she wants support with her mental health struggles at this time.  [name], LCSW Clinical Social Worker Advanced Heart Failure Clinic Desk#: [phone] Cell#: [phone]

## 2021-08-27 NOTE — Progress Notes (Signed)
ReDS Vest / Clip - 08/27/21 1000       ReDS Vest / Clip   Station Marker A    Ruler Value 33    ReDS Value Range Low volume    ReDS Actual Value 33

## 2021-08-27 NOTE — Addendum Note (Signed)
Encounter addended by: [name], LCSW on: 08/27/2021 11:46 AM  Actions taken: Flowsheet accepted, Clinical Note Signed

## 2021-08-27 NOTE — Patient Instructions (Signed)
Stop Bidil  Start Hydralazine 50 mg Three times a day   Your physician has requested that you regularly monitor and record your blood pressure readings at home. Please use the same machine at the same time of day to check your readings and record them to bring to your follow-up visit.  Your physician recommends that you schedule a follow-up appointment in: 6 weeks and again in 3 months  If you have any questions or concerns before your next appointment please send [place] a message through [place] or call our office at [phone].    TO LEAVE A MESSAGE FOR THE NURSE SELECT OPTION 2, PLEASE LEAVE A MESSAGE INCLUDING: YOUR NAME DATE OF BIRTH CALL BACK NUMBER REASON FOR CALL**this is important as we prioritize the call backs  YOU WILL RECEIVE A CALL BACK THE SAME DAY AS LONG AS YOU CALL BEFORE 4:00 PM  At the [facility], you and your health needs are our priority. As part of our continuing mission to provide you with exceptional heart care, we have created designated Provider Care Teams. These Care Teams include your primary Cardiologist (physician) and Advanced Practice Providers (APPs- Physician Assistants and Nurse Practitioners) who all work together to provide you with the care you need, when you need it.   You may see any of the following providers on your designated Care Team at your next follow up: Dr Glori Bickers Dr Haynes Kerns, NP Lyda Jester, [place] [facility] [place], [place] [name], PharmD   Please be sure to bring in all your medications bottles to every appointment.

## 2021-08-27 NOTE — Progress Notes (Addendum)
Advanced Heart Failure Clinic Note    PCP: Gifford Shave, MD HF Cardiologist: Dr. Aundra Dubin  EP- Dr. Curt Bears   HPI: [name] is a 46 y.o. with history of type 2 diabetes (poorly controlled, last hgbA1c > 15), poorly controlled HTN, hyperlipidemia, paroxysmal atrial fibrillation, and chronic systolic CHF of uncertain etiology.  Patient had an echo in 6/20 as outpatient due to exertional dyspnea. This showed EF 20-25%, moderate RV dysfunction.  She was also noted to have paroxysmal atrial fibrillation.  She was started on Xarelto.  In 7/20, she was admitted with DKA. In 8/20, she was admitted with CHF exacerbation and diuresed.  She was sent home on Lasix 40 mg po bid.    Readmitted on 9/20 with CHF exacerbation, in the setting of elevated BP.  Diuresed with IV lasix.  LHC showed nonobstructive CAD.  RHC showed R>L heart failure with low PAPI suggesting significant RV dysfunction. She was transitioned to PO torsemide and treated w/ guidelines directed medical therapy w/ Entresto, Spironolactone, Coreg and digoxin. She was also transitioned off of Xarelto and placed on Coumadin.  Readmitted 12/30/6718 with A/C systolic heart failure. She had not been taking her lasix because she says it was on her AVS to stop it.  Diuresed with IV lasix and transitioned to torsemide 40 mg daily. Weight day before discharge was 190 pounds. Echo with LV EF <20%, RV severely decreased systolic function.   Echo in 6/21 showed EF 35%, moderately decreased RV systolic function, moderate LAE, mild MR.  Echo was done today and reviewed, EF 35-40%, global hypokinesis, mild LVH, normal RV, IVC normal. She was also ordered to get a cMRI to look for scar/infiltrative disease and quantify EF. This has not been done yet.   She has had recent issues w/ controlling fluid given intermittent compliance w/ meds, has required adjustments to diuretics. At clinic f/u 2/22, she had stopped taking her medications because she thought she  was pregnant. Urine pregnancy test was negative. Meds restarted. Also w/ poorly controlled DM.   Today she returns for HF follow up. 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 Continues to struggle with psychosocial issues. She does not have a car right now. She is not overly SOB with walking on flat ground. Denies palpitations, abnormal bleeding, CP, dizziness, edema, or PND/Orthopnea. Appetite ok. No fever or chills. Weight at home 134 pounds. Doing better with taking her medications, has stopped her BiDil due to worsening migraines.    Labs (5/21): hgbA1c 9.3, BNP 76, K 3.9, creatinine 1.1 Labs (6/21): digoxin 0.4, K 4.1, creatinine 0.77 Labs (10/21): BNP 25, K 4.1, creatinine 0.91 Labs (11/21): K 3.7, creatinine 0.6, digoxin <0.2 Labs (12/21): K 4.2, creatinine 0.83 Labs (07/28/2020): K 3.9 Creatinine 0/7  Labs ( 09/03/20): K 3.8 Creatinine 0.5 Glucose 428  Labs 09/10/20: Pregnancy Test negative.  Labs (6/22): SCr 0.93, K 4 Labs (10/22): SCr 0.68, K 4.4  Labs (12/22): K 4.4, creatinine 0.66  PMH: 1. Type 2 diabetes: H/o DKA 2. HTN 3. OSA 4. Obesity 5. Polycystic ovarian syndrome 6. Chronic systolic CHF: Nonischemic cardiomyopathy - Echo (6/20) with EF 20-25%, moderate RV dysfunction.  - LHC/RHC (9/20): No significant CAD.  - Echo (3/21): EF <20%, moderate LVH with global HK, severely reduced RV systolic function.  - Echo (6/21): EF 35%, moderately decreased RV systolic function, moderate LAE, mild MR. - Echo (11/21): EF 35-40%, global hypokinesis, mild LVH, normal RV, IVC normal.  7. Hyperlipidemia 8. Atrial fibrillation: Paroxysmal.  9.  History of LV thrombus.    Current Outpatient Medications  Medication Sig Dispense Refill   Accu-Chek Softclix Lancets lancets Use as instructed 100 each 12   albuterol (VENTOLIN HFA) 108 (90 Base) MCG/ACT inhaler INHALE 2 PUFFS INTO THE LUNGS EVERY 6 (SIX) HOURS AS NEEDED FOR WHEEZING OR SHORTNESS OF BREATH. 8.5 g 1   atorvastatin (LIPITOR) 40 MG  tablet TAKE 1 TABLET BY MOUTH DAILY AT 6 PM. 30 tablet 2   blood glucose meter kit and supplies KIT Dispense based on patient and insurance preference. Use up to four times daily as directed. (FOR ICD-9 250.00, 250.01). 1 each 0   Blood Glucose Monitoring Suppl (ACCU-CHEK AVIVA PLUS) w/Device KIT 1 application by Does not apply route QID. Check fasting sugars as well as throughout the day 1 kit 0   busPIRone (BUSPAR) 7.5 MG tablet Take 7.5 mg by mouth 2 (two) times daily.     carvedilol (COREG) 25 MG tablet TAKE 1 TABLET (25 MG TOTAL) BY MOUTH 2 (TWO) TIMES DAILY WITH A MEAL. 180 tablet 3   digoxin (LANOXIN) 0.125 MG tablet Take 1 tablet (0.125 mg total) by mouth daily. 30 tablet 3   empagliflozin (JARDIANCE) 25 MG TABS tablet Take 25 mg by mouth daily. 90 tablet 3   gabapentin (NEURONTIN) 100 MG capsule Take 100 mg by mouth 2 (two) times daily.     glucose blood (ACCU-CHEK AVIVA PLUS) test strip Check CBG 2 times per day 100 each 12   glucose blood (AGAMATRIX PRESTO TEST) test strip Use as instructed 100 each 12   hydrOXYzine (ATARAX) 10 MG tablet Take 1 tablet (10 mg total) by mouth 3 (three) times daily as needed. 30 tablet 0   insulin aspart protamine- aspart (NOVOLOG MIX 70/30) (70-30) 100 UNIT/ML injection Inject 0.22 mLs (22 Units total) into the skin 2 (two) times daily with a meal. 10 mL 11   Insulin Syringe-Needle U-100 (INSULIN SYRINGE .3CC/31GX5/16") 31G X 5/16" 0.3 ML MISC 15 Units by Does not apply route 2 (two) times daily. 100 each 3   isosorbide-hydrALAZINE (BIDIL) 20-37.5 MG tablet Take 1 tablet by mouth 3 (three) times daily.     Lancets Misc. (ACCU-CHEK SOFTCLIX LANCET DEV) KIT 1 application by Does not apply route 2 (two) times daily. 1 kit 0   meclizine (ANTIVERT) 12.5 MG tablet Take 1 tablet (12.5 mg total) by mouth 3 (three) times daily as needed for dizziness. 30 tablet 0   potassium chloride SA (KLOR-CON) 20 MEQ tablet Take 20 mEq by mouth See admin instructions. Take 1  tablet by mouth every Monday Wednesday and Friday     sacubitril-valsartan (ENTRESTO) 97-103 MG TAKE 1 TABLET BY MOUTH 2 (TWO) TIMES DAILY. 60 tablet 3   sertraline (ZOLOFT) 25 MG tablet Take 1 tablet (25 mg total) by mouth daily. 30 tablet 3   spironolactone (ALDACTONE) 25 MG tablet Take 25 mg by mouth daily.     torsemide (DEMADEX) 20 MG tablet Take 5 tablets (100 mg total) by mouth daily.     traZODone (DESYREL) 50 MG tablet Take 1 tablet (50 mg total) by mouth at bedtime as needed for sleep. 30 tablet 0   warfarin (COUMADIN) 7.5 MG tablet Take 1.5 tablets (11.25 mg total) by mouth daily as directed by the coumadin clinic 50 tablet 1   No current facility-administered medications for this encounter.   Allergies  Allergen Reactions   Shellfish Allergy Swelling    Airway involvement including EMS - Has Epi-Pen  Dulaglutide Nausea And Vomiting and Other (See Comments)    Multiple episodes of vomiting over multiple days   Metformin And Related Diarrhea   Ibuprofen     Per PCP interferes with daily meds    Social History   Socioeconomic History   Marital status: Single    Spouse name: Not on file   Number of children: Not on file   Years of education: Not on file   Highest education level: Not on file  Occupational History   Not on file  Tobacco Use   Smoking status: Never   Smokeless tobacco: Never  Vaping Use   Vaping Use: Never used  Substance and Sexual Activity   Alcohol use: No    Alcohol/week: 0.0 standard drinks   Drug use: No   Sexual activity: Yes  Other Topics Concern   Not on file  Social History Narrative   Not on file   Social Determinants of Health   Financial Resource Strain: Not on file  Food Insecurity: Not on file  Transportation Needs: Not on file  Physical Activity: Not on file  Stress: Not on file  Social Connections: Not on file  Intimate Partner Violence: Not on file     Family History  Problem Relation Age of Onset   Diabetes Mother     Hypertension Mother    Hypertension Father    Diabetes Father    BP (!) 162/138    Pulse (!) 105    Wt 83.3 kg (183 lb 9.6 oz)    SpO2 99%    BMI 37.08 kg/m   Wt Readings from Last 3 Encounters:  08/27/21 83.3 kg (183 lb 9.6 oz)  07/23/21 82.6 kg (182 lb)  05/20/21 83.9 kg (185 lb)   PHYSICAL EXAM: ReDs Clip 33%  General:  NAD. No resp difficulty HEENT: Normal Neck: Supple. Thick neck. Carotids 2+ bilat; no bruits. No lymphadenopathy or thryomegaly appreciated. Cor: PMI nondisplaced. Regular rate & rhythm. No rubs, gallops or murmurs. Lungs: Clear Abdomen: Obese, nontender, nondistended. No hepatosplenomegaly. No bruits or masses. Good bowel sounds. Extremities: No cyanosis, clubbing, rash, edema Neuro: Alert & oriented x 3, cranial nerves grossly intact. Moves all 4 extremities w/o difficulty. Affect pleasant.  ASSESSMENT & PLAN: 1. Chronic systolic CHF:  Nonischemic cardiomyopathy, diagnosed in 6/20. Repeat Echo 03/2019 showed EF 20-25%, diffuse hypokinesis, LV thrombus, moderate RV systolic dysfunction, mild MR, moderate TR. Echo in 3/21 with EF <30%, severe RV dysfunction.  She has a history of paroxysmal atrial fibrillation but seems to be mostly in sinus rhythm.  This is not likely to be a tachycardia-mediated car%.diomyopathy.  Poorly controlled diabetes itself can cause a cardiomyopathy, as can poorly controlled HTN.  Myocarditis is a potential cause of her cardiomyopathy. [place] 9/20 showed nonobstructive CAD. RHC showed R>L heart failure with low PAPI suggesting significant RV dysfunction.  Echo in 6/21 with EF up to 35%. Echo 11/21 EF 35-40% Garde I DD. NYHA II. She is not volume overloaded on exam, ReDs clip 33%.  - With HA/migraines, stop BiDil. - Start hydralzine 50 mg tid. - Continue torsemide 100 mg daily. BMET today. - Continue Entresto 97/103 mg bid.  - Continue carvedilol 25 mg bid. - Continue ivabradine 5 mg bid. HR 105 today. - Continue spironolactone 25 mg daily.   - Continue digoxin 0.125 mg daily. Check Dig level today.  - Continue empagliflozin 25 mg daily.  No recent UTI/yeast infections, but has had in past. Will need to monitor. -  She previously had been trying to get pregnant (has been difficult with polycystic ovarian syndrome).  She has 2 grown children already. With her cardiomyopathy and active CHF, pregnancy would create a significant mortality risk for her.  A number of her cardiac meds to control her CHF are feto-toxic. Discussed importance of strict regular birth control/ use of condoms to avoid pregnancy    2. LV thrombus - LV thrombus noted on echo 9/20.   - On Coumadin (she reports she has been fully compliant w/ this)  - INRs followed by [facility]. Check INR today and forward to clinic.  3. Atrial fibrillation, Paroxysmal.  - RRR on exam. - Continue warfarin (using warfarin rather than DOAC with h/o LV thrombus).  - Would consider atrial fibrillation ablation in the future, if recurrence, given CASTLE-HF data.   4. Type 2 diabetes  - poorly controlled, most recent Hgb A1c 14  - Insulin per PCP - c/w Jardiance  5. HTN  - Elevated today, but just took meds. - Hydralzine as above. - Check BP at home and log, bring to next visit.    6. Situational Depression  - Denies SI/HI   - Offered referral to counseling but she declines.  7. SDOH - HF SW meet w/ pt to offer assistance. - She has Medicaid and can use transportation through their benefit. - Has disability. - She has a scale at home.   Follow up with APP in 6 weeks for BP and med check, and 3 months with Dr. Aundra Dubin.  [place], FNP 08/27/21

## 2021-08-31 ENCOUNTER — Other Ambulatory Visit (HOSPITAL_COMMUNITY): Payer: Self-pay

## 2021-08-31 ENCOUNTER — Other Ambulatory Visit: Payer: Self-pay

## 2021-08-31 ENCOUNTER — Ambulatory Visit (INDEPENDENT_AMBULATORY_CARE_PROVIDER_SITE_OTHER): Payer: Medicaid Other | Admitting: Family Medicine

## 2021-08-31 VITALS — BP 132/86 | HR 106 | Wt 185.6 lb

## 2021-08-31 DIAGNOSIS — F330 Major depressive disorder, recurrent, mild: Secondary | ICD-10-CM | POA: Diagnosis not present

## 2021-08-31 DIAGNOSIS — E1165 Type 2 diabetes mellitus with hyperglycemia: Secondary | ICD-10-CM

## 2021-08-31 LAB — POCT GLYCOSYLATED HEMOGLOBIN (HGB A1C): HbA1c, POC (controlled diabetic range): 13.2 % — AB (ref 0.0–7.0)

## 2021-08-31 MED ORDER — SERTRALINE HCL 25 MG PO TABS
25.0000 mg | ORAL_TABLET | Freq: Every day | ORAL | 3 refills | Status: DC
Start: 2021-08-31 — End: 2021-10-19
  Filled 2021-08-31: qty 30, 30d supply, fill #0

## 2021-08-31 NOTE — Progress Notes (Signed)
° ° °  SUBJECTIVE:   CHIEF COMPLAINT / HPI:   Anxiety  Patient reports considerable anxiety.  She reports that it is because her health is declining and every time she goes to the doctor she "gets bad news".  She spoke with her heart failure team about this who prescribed Atarax 10 mg 3 times daily as needed.  She has been on Zoloft in the past and is not quite sure why she stopped taking it.  Would be interested in restarting.  Denies any SI or HI today.  Diabetes Patient reports that her diabetes has not been well controlled since being seen last.  She is currently taking 22 units of 70/30 insulin twice daily.  She reports that she does not check her blood sugars because she feels like she "does not have time" but is interested in a continuous glucose monitor.  She has been seen in the emergency department multiple times over the last year for hyperglycemia.  She has been on this dose of 7030 since her last hospital visit.  Denies any signs or symptoms of hyperglycemia today during the visit.  She would also be interested in other forms of medication to help with the diabetes.  She is currently taking her Jardiance as prescribed.  OBJECTIVE:   BP 132/86    Pulse (!) 106    Wt 185 lb 9.6 oz (84.2 kg)    SpO2 100%    BMI 37.49 kg/m   General: Pleasant 46 year old female Cardiac: Regular rate on auscultation although rate recorded on entry vitals was elevated at 106. Respiratory: Normal work of breathing, lungs clear to auscultation Abdomen: Soft, nontender Neuro: No gross abnormalities Psych: Anxious patient, intermittently sad appearing.  Denies SI or HI today  PHQ9 SCORE ONLY 08/31/2021 02/10/2020 12/11/2019  PHQ-9 Total Score 9 12 14      ASSESSMENT/PLAN:   DM2 (diabetes mellitus, type 2) ([place]) Patient continues to not take her blood sugars regularly.  Currently on Jardiance as well as 70/30 insulin.  She reports that she was placed on 70/30 insulin because she did not have any insurance  but now she has disability.  Hemoglobin A1c today of 13.2.  Patient is requesting continuous glucose monitor. - Appointment scheduled with pharmacology team to further investigate patient's current diabetic regimen and determine best route to proceed - Follow-up in 1 to 3 months depending on what the pharmacology team decides to do with her current regimen.  Feel like she may do better on once daily long-acting insulin and consider GLP-1.  Major depressive disorder Patient not currently doing well on anxiety regimen.  Taking hydroxyzine 10 mg 3 times daily as needed as well as buspirone.  Will initiate SSRI Zoloft 25 mg daily.  She has been on Zoloft in the past and is unclear why she discontinued it.  She has also tried Cymbalta which was also discontinued but she is not sure why.  Reviewing chart it is difficult to determine why these medications were stopped.  We will follow-up in 1 month to evaluate depressive symptoms.  Strict ED and return precautions given.     Gifford Shave, MD [place]

## 2021-08-31 NOTE — Patient Instructions (Signed)
It was good seeing you today.  Your hemoglobin A1c was 13.2.  I want to increase your insulin to 28 units in the morning and 22 units at night.  We are scheduling you for an appointment with our pharmacy team to discuss the continuous glucose monitor as well as make adjustments to your insulin.  I believe they will probably change the type of insulin you are on.  Please continue to take the Jardiance as well.  If you have any episodes of low blood sugar please be evaluated immediately.  Symptoms of hypoglycemia are listed below.  Regarding her depression I would like to restart Zoloft which I have sent this prescription to your pharmacy.  He will start at 25 mg daily.  I would like to see you in 1 month to see how your symptoms are doing.  If you have any questions or concerns call our clinic.  I hope you have a wonderful day!

## 2021-09-01 NOTE — Assessment & Plan Note (Signed)
Patient continues to not take her blood sugars regularly.  Currently on Jardiance as well as 70/30 insulin.  She reports that she was placed on 70/30 insulin because she did not have any insurance but now she has disability.  Hemoglobin A1c today of 13.2.  Patient is requesting continuous glucose monitor. - Appointment scheduled with pharmacology team to further investigate patient's current diabetic regimen and determine best route to proceed - Follow-up in 1 to 3 months depending on what the pharmacology team decides to do with her current regimen.  Feel like she may do better on once daily long-acting insulin and consider GLP-1.

## 2021-09-01 NOTE — Assessment & Plan Note (Signed)
Patient not currently doing well on anxiety regimen.  Taking hydroxyzine 10 mg 3 times daily as needed as well as buspirone.  Will initiate SSRI Zoloft 25 mg daily.  She has been on Zoloft in the past and is unclear why she discontinued it.  She has also tried Cymbalta which was also discontinued but she is not sure why.  Reviewing chart it is difficult to determine why these medications were stopped.  We will follow-up in 1 month to evaluate depressive symptoms.  Strict ED and return precautions given.

## 2021-09-02 ENCOUNTER — Ambulatory Visit (INDEPENDENT_AMBULATORY_CARE_PROVIDER_SITE_OTHER): Payer: Medicaid Other | Admitting: Pharmacist

## 2021-09-02 ENCOUNTER — Other Ambulatory Visit (HOSPITAL_COMMUNITY): Payer: Self-pay

## 2021-09-02 ENCOUNTER — Encounter: Payer: Self-pay | Admitting: Pharmacist

## 2021-09-02 ENCOUNTER — Other Ambulatory Visit: Payer: Self-pay

## 2021-09-02 DIAGNOSIS — E785 Hyperlipidemia, unspecified: Secondary | ICD-10-CM | POA: Diagnosis not present

## 2021-09-02 DIAGNOSIS — E1169 Type 2 diabetes mellitus with other specified complication: Secondary | ICD-10-CM

## 2021-09-02 DIAGNOSIS — E1165 Type 2 diabetes mellitus with hyperglycemia: Secondary | ICD-10-CM | POA: Diagnosis not present

## 2021-09-02 MED ORDER — INSULIN GLARGINE 100 UNIT/ML SOLOSTAR PEN
36.0000 [IU] | PEN_INJECTOR | SUBCUTANEOUS | 11 refills | Status: DC
  Filled 2021-09-02: qty 15, 41d supply, fill #0
  Filled 2022-01-21: qty 15, 41d supply, fill #1

## 2021-09-02 MED ORDER — NOVOLOG FLEXPEN 100 UNIT/ML ~~LOC~~ SOPN
12.0000 [IU] | PEN_INJECTOR | Freq: Every day | SUBCUTANEOUS | 11 refills | Status: DC
  Filled 2021-09-02: qty 9, 75d supply, fill #0
  Filled 2022-01-21: qty 9, 75d supply, fill #1

## 2021-09-02 MED ORDER — FREESTYLE LIBRE 2 SENSOR MISC
2 refills | Status: DC
  Filled 2021-09-02: qty 2, 28d supply, fill #0

## 2021-09-02 MED ORDER — FREESTYLE LIBRE 2 READER DEVI
0 refills | Status: DC
  Filled 2021-09-02: qty 1, 1d supply, fill #0

## 2021-09-02 MED ORDER — UNIFINE PENTIPS 31G X 5 MM MISC
1.0000 | 11 refills | Status: DC | PRN
  Filled 2021-09-02: qty 100, 50d supply, fill #0
  Filled 2022-01-21: qty 100, 50d supply, fill #1
  Filled 2022-07-04: qty 100, 50d supply, fill #2

## 2021-09-02 NOTE — Patient Instructions (Addendum)
It was nice to see you today!  We are going to be changing your insulins today: -Give your last dose of your Novolog 70-30 tonight, then STOP -Start Lantus (gray pen) 36 units daily in the morning. -Start Novolog Flexpen (navy pen) 15 units daily before your dinner (largest meal).   You can start these new insulins tomorrow.   We will see you in two weeks on 09/16/21 at 11:15am. We have sent in prescriptions for the [facility] (glucose reader that goes on your arm). Please pick this up before your next appointment and bring it to that visit so we can show you how to use it.

## 2021-09-02 NOTE — Progress Notes (Signed)
S:    [name] is a 46 y.o. female who presents for diabetes evaluation, education, and management. PMH is significant for Heart failure. Patient was referred and last seen by Primary Care Provider, Dr. Caron Presume, on 08/31/21. At last visit, she discussed how she felt like she cannot check her blood sugars as she does not feel she has time. She was also interested in other medications to help with diabetes.  Today, She arrives with depressed mood and presents without assistance. Patient reports increased thirst and urination. She also reports that her health has been fluctuating and finds it difficult to manage all her medication.  Patient reports Diabetes was diagnosed in 2020.   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  Current diabetes medications include: Jardiance 25mg , Novolog 70/30 22 units twice a day. Current hypertension medications include: carevedilol 25mg  BID, Digoxin 0.125mg , hydralazine 50mg  TID, Entresto 97-103mg  BID, spironolactone 25mg , toresemide 20mg  five times daily. Current hyperlipidemia medications include: atorvastatin 40mg   Patient states that She is taking her medications as prescribed. Patient reports adherence with medications. Patient states that she misses her medications 0-3 times per week, on average.   Do you feel that your medications are working for you? yes Have you been experiencing any side effects to the medications prescribed? no Insurance coverage: Medicaid  Patient denies hypoglycemic events. Patient does not check her blood sugars.  Patient reports nocturia (nighttime urination).   Patient reported dietary habits: Eats 2 meals/day Breakfast: boiled eggs Lunch: does not eat lunch Dinner: Noodles, PB&J, baked potatoes, [place] [employer] Snacks: Chips Drinks: Regular Soda, Regular Powerade, sweet tea, water  Patient-reported exercise habits: walking (has no idea how long she walks  when she is not driving)  O:  Physical Exam Constitutional:      Appearance: Normal appearance.  Neurological:     Mental Status: She is alert.    Review of Systems  Constitutional:  Positive for malaise/fatigue.  Psychiatric/Behavioral:  Positive for depression. The patient is nervous/anxious.   All other systems reviewed and are negative.  Lab Results  Component Value Date   HGBA1C 13.2 (A) 08/31/2021   Vitals:   09/02/21 1035  BP: 132/84  Pulse: (!) 103  SpO2: 99%   Lipid Panel     Component Value Date/Time   CHOL 161 04/03/2020 1429   CHOL 266 (H) 10/20/2017 1054   TRIG 184 (H) 04/03/2020 1429   HDL 37 (L) 04/03/2020 1429   HDL 43 10/20/2017 1054   CHOLHDL 4.4 04/03/2020 1429   VLDL 37 04/03/2020 1429   LDLCALC 87 04/03/2020 1429   LDLCALC 195 (H) 10/20/2017 1054   LDLDIRECT 154 (H) 01/20/2014 1522   Clinical Atherosclerotic Cardiovascular Disease (ASCVD): No  The ASCVD Risk score (Arnett DK, et al., 2019) failed to calculate for the following reasons:   The patient has a prior MI or stroke diagnosis   A/P: Diabetes longstanding currently uncontrolled. Patient is able to verbalize appropriate hypoglycemia management plan. Medication adherence appears optimal. Control is suboptimal due to lack of medication optimization. -Discontinued Novolog 70-30, 22 units BID. -Started basal insulin Lantus (insulin glargine) 36 units in the morning.  -Started rapid insulin Novolog Flexpen (insulin aspart) to 15 units daily before largest meal. -Continued SGLT2-I Jardiance 25mg  (generic name empagliflozin).  Counseled on sick day rules. -Patient educated on purpose, proper use, and potential adverse effects of Lantus and Novolog.  -Extensively discussed pathophysiology of diabetes, recommended lifestyle  interventions, dietary effects on blood sugar control.  Checks blood glucose readings with two insulin injections per day, and requires frequent adjustments to insulin regimen.  This patient will be seen every six months, minimally, to assess adherence to their CGM regimen and diabetes treatment plan. - CGM - Freestlyle Libre ordered for improvement in glucose tracking, and medication adjustment.  Patient asked to bring device to next visit.   -Counseled on s/sx of and management of hypoglycemia.  -Next A1c anticipated May 2023.   ASCVD risk - primary prevention in patient with diabetes. Last LDL is 195 not at goal of <70 mg/dL. ASCVD risk factors include DM. High intensity statin indicated. Currently uncontrolled due to patient with multiple comorbid disease states and questionable adherence. -Continued atorvastatin 40 mg. Consider new fasting lipid panel at follow-up as last was in 2019. If elevated, consider increasing atorvastatin to 80mg .  Hypertension longstanding currently uncontrolled. Blood pressure goal of <130/80 mmHg. Medication adherence reported as optimal. Blood pressure control is suboptimal due to multiple comorbid disease states. -Continued all current blood pressure medications.  Written patient instructions provided. Patient verbalized understanding of treatment plan. Total time in face to face counseling 50 minutes.    Follow up pharmacist clinic visit on 09/16/21 to discuss CGM. Patient seen with Gala Murdoch, PharmD Candidate, and Rebbeca Paul, PharmD - PGY2 Pharmacy Resident.  Marland Kitchen

## 2021-09-02 NOTE — Assessment & Plan Note (Signed)
ASCVD risk - primary prevention in patient with diabetes. Last LDL is 195 not at goal of <70 mg/dL. ASCVD risk factors include DM. High intensity statin indicated. Currently uncontrolled due to patient with multiple comorbid disease states and questionable adherence. -Continued atorvastatin 40 mg. Consider new fasting lipid panel at follow-up as last was in 2019. If elevated, consider increasing atorvastatin to 80mg .

## 2021-09-02 NOTE — Assessment & Plan Note (Signed)
Diabetes longstanding currently uncontrolled. Patient is able to verbalize appropriate hypoglycemia management plan. Medication adherence appears optimal. Control is suboptimal due to lack of medication optimization. -Discontinued Novolog 70-30, 22 units BID. -Started basal insulin Lantus (insulin glargine) 36 units in the morning.  -Started rapid insulin Novolog Flexpen (insulin aspart) to 15 units daily before largest meal. -Continued SGLT2-I Jardiance 25mg  (generic name empagliflozin).  Counseled on sick day rules. -Patient educated on purpose, proper use, and potential adverse effects of Lantus and Novolog.  -Extensively discussed pathophysiology of diabetes, recommended lifestyle interventions, dietary effects on blood sugar control.  Checks blood glucose readings with two insulin injections per day, and requires frequent adjustments to insulin regimen. This patient will be seen every six months, minimally, to assess adherence to their CGM regimen and diabetes treatment plan. - CGM - Freestlyle Libre ordered for improvement in glucose tracking, and medication adjustment.  Patient asked to bring device to next visit.

## 2021-09-02 NOTE — Progress Notes (Signed)
Reviewed: I agree with Dr. Koval's documentation and management. 

## 2021-09-03 ENCOUNTER — Ambulatory Visit (INDEPENDENT_AMBULATORY_CARE_PROVIDER_SITE_OTHER): Payer: Medicaid Other | Admitting: *Deleted

## 2021-09-03 DIAGNOSIS — I480 Paroxysmal atrial fibrillation: Secondary | ICD-10-CM

## 2021-09-03 DIAGNOSIS — Z5181 Encounter for therapeutic drug level monitoring: Secondary | ICD-10-CM

## 2021-09-03 LAB — POCT INR: INR: 2.2 (ref 2.0–3.0)

## 2021-09-03 NOTE — Progress Notes (Addendum)
Ride Scheduled! Ride ID:  [id number]  Transportation Type:  RIDESHARE (UBER/LYFT)  Pickup Date/Time:  09/16/21 at 10:53 AM (EST)  (Appointment Time: 11:45 AM (EST))  [facility] Address  [address] [place], [place] [address], [place] Drop-off Address  [address] [place], [place] [address], [place]    Ride scheduled for pt.  Late Entry 09/06/2021: Noticed pt already had appointment scheduled with PCP right before coming here on 2/23. Called and spoke to pt who stated she was just planning on walking to coumadin appointment from PCP. Spoke to Dewey the [place] who canceled appt through [place] and would remind pt to [place] medicaid transportation benefit.

## 2021-09-03 NOTE — Patient Instructions (Signed)
Description   Continue taking Warfarin 1.5 tablets daily. Recheck INR in 2 weeks. [facility]in [facility] [phone].

## 2021-09-06 ENCOUNTER — Telehealth: Payer: Self-pay

## 2021-09-06 ENCOUNTER — Telehealth: Payer: Self-pay | Admitting: Licensed Clinical Social Worker

## 2021-09-06 ENCOUNTER — Other Ambulatory Visit (HOSPITAL_COMMUNITY): Payer: Self-pay

## 2021-09-06 NOTE — Telephone Encounter (Signed)
A Prior Authorization was initiated for this patients FREESTYLE LIBRE 2 SENSOR through CoverMyMeds.   Key: [id number]

## 2021-09-06 NOTE — Telephone Encounter (Signed)
Prior Auth for patients medication FREESTYLE LIBRE 2 SENSOR approved by [employer] from 09/06/21 to 03/06/22.   Key:  [id number]

## 2021-09-06 NOTE — Telephone Encounter (Signed)
Received request for pt ride to coumadin appt on 2/23 be cancelled as she has another appt across the street prior to their appt. LCSW assisted with cancelling that ride. I reached out to [place], [facility] LCSW, and she confirmed that pt should be utilizing her [facility] Medicaid ride benefits which had been explained to her during appt on 2/3. This LCSW mailed a reminder of upcoming appts along with [facility] Medicaid transportation again to pt. Pt was encouraged to call the ride service at least 2 days in advance of appt. I have made Jenna and Coumadin RN aware of these changes and repeat advice.   Westley Hummer, MSW, [place]  [phone]- work cell phone (preferred) [phone]- desk phone

## 2021-09-16 ENCOUNTER — Ambulatory Visit: Payer: Medicaid Other | Admitting: Pharmacist

## 2021-09-16 ENCOUNTER — Other Ambulatory Visit: Payer: Self-pay

## 2021-09-16 MED ORDER — ALBUTEROL SULFATE HFA 108 (90 BASE) MCG/ACT IN AERS
2.0000 | INHALATION_SPRAY | Freq: Four times a day (QID) | RESPIRATORY_TRACT | 1 refills | Status: DC | PRN
Start: 2021-09-16 — End: 2022-05-06
  Filled 2021-09-16: qty 18, 25d supply, fill #0

## 2021-09-17 ENCOUNTER — Emergency Department (HOSPITAL_COMMUNITY): Payer: Medicaid Other

## 2021-09-17 ENCOUNTER — Other Ambulatory Visit (HOSPITAL_COMMUNITY): Payer: Self-pay

## 2021-09-17 ENCOUNTER — Inpatient Hospital Stay (HOSPITAL_COMMUNITY)
Admission: EM | Admit: 2021-09-17 | Discharge: 2021-09-22 | DRG: 286 | Disposition: A | Discharge status: Home / Self Care | Payer: Medicaid Other | Attending: Family Medicine | Admitting: Family Medicine

## 2021-09-17 ENCOUNTER — Other Ambulatory Visit: Payer: Self-pay

## 2021-09-17 DIAGNOSIS — N979 Female infertility, unspecified: Secondary | ICD-10-CM | POA: Diagnosis present

## 2021-09-17 DIAGNOSIS — Z91013 Allergy to seafood: Secondary | ICD-10-CM

## 2021-09-17 DIAGNOSIS — I517 Cardiomegaly: Secondary | ICD-10-CM | POA: Diagnosis not present

## 2021-09-17 DIAGNOSIS — E785 Hyperlipidemia, unspecified: Secondary | ICD-10-CM | POA: Diagnosis present

## 2021-09-17 DIAGNOSIS — E282 Polycystic ovarian syndrome: Secondary | ICD-10-CM | POA: Diagnosis present

## 2021-09-17 DIAGNOSIS — E1169 Type 2 diabetes mellitus with other specified complication: Secondary | ICD-10-CM | POA: Diagnosis present

## 2021-09-17 DIAGNOSIS — Z66 Do not resuscitate: Secondary | ICD-10-CM | POA: Diagnosis present

## 2021-09-17 DIAGNOSIS — R0602 Shortness of breath: Secondary | ICD-10-CM | POA: Diagnosis not present

## 2021-09-17 DIAGNOSIS — Z79899 Other long term (current) drug therapy: Secondary | ICD-10-CM

## 2021-09-17 DIAGNOSIS — I5042 Chronic combined systolic (congestive) and diastolic (congestive) heart failure: Secondary | ICD-10-CM | POA: Diagnosis present

## 2021-09-17 DIAGNOSIS — E1165 Type 2 diabetes mellitus with hyperglycemia: Secondary | ICD-10-CM | POA: Diagnosis present

## 2021-09-17 DIAGNOSIS — I2510 Atherosclerotic heart disease of native coronary artery without angina pectoris: Secondary | ICD-10-CM | POA: Diagnosis present

## 2021-09-17 DIAGNOSIS — R079 Chest pain, unspecified: Secondary | ICD-10-CM | POA: Diagnosis not present

## 2021-09-17 DIAGNOSIS — Z833 Family history of diabetes mellitus: Secondary | ICD-10-CM

## 2021-09-17 DIAGNOSIS — Z886 Allergy status to analgesic agent status: Secondary | ICD-10-CM

## 2021-09-17 DIAGNOSIS — I428 Other cardiomyopathies: Secondary | ICD-10-CM | POA: Diagnosis present

## 2021-09-17 DIAGNOSIS — I110 Hypertensive heart disease with heart failure: Secondary | ICD-10-CM | POA: Diagnosis not present

## 2021-09-17 DIAGNOSIS — Z794 Long term (current) use of insulin: Secondary | ICD-10-CM

## 2021-09-17 DIAGNOSIS — F4321 Adjustment disorder with depressed mood: Secondary | ICD-10-CM | POA: Diagnosis present

## 2021-09-17 DIAGNOSIS — Z8249 Family history of ischemic heart disease and other diseases of the circulatory system: Secondary | ICD-10-CM

## 2021-09-17 DIAGNOSIS — Z7901 Long term (current) use of anticoagulants: Secondary | ICD-10-CM

## 2021-09-17 DIAGNOSIS — I480 Paroxysmal atrial fibrillation: Secondary | ICD-10-CM | POA: Diagnosis present

## 2021-09-17 DIAGNOSIS — R0789 Other chest pain: Secondary | ICD-10-CM | POA: Diagnosis not present

## 2021-09-17 DIAGNOSIS — I509 Heart failure, unspecified: Secondary | ICD-10-CM | POA: Diagnosis not present

## 2021-09-17 DIAGNOSIS — I959 Hypotension, unspecified: Secondary | ICD-10-CM | POA: Diagnosis present

## 2021-09-17 DIAGNOSIS — I5023 Acute on chronic systolic (congestive) heart failure: Secondary | ICD-10-CM | POA: Diagnosis present

## 2021-09-17 DIAGNOSIS — I5041 Acute combined systolic (congestive) and diastolic (congestive) heart failure: Secondary | ICD-10-CM | POA: Diagnosis present

## 2021-09-17 DIAGNOSIS — Z7984 Long term (current) use of oral hypoglycemic drugs: Secondary | ICD-10-CM

## 2021-09-17 DIAGNOSIS — G4733 Obstructive sleep apnea (adult) (pediatric): Secondary | ICD-10-CM | POA: Diagnosis present

## 2021-09-17 DIAGNOSIS — G8929 Other chronic pain: Secondary | ICD-10-CM

## 2021-09-17 DIAGNOSIS — R000 Tachycardia, unspecified: Secondary | ICD-10-CM | POA: Diagnosis not present

## 2021-09-17 DIAGNOSIS — Z888 Allergy status to other drugs, medicaments and biological substances status: Secondary | ICD-10-CM

## 2021-09-17 DIAGNOSIS — J9601 Acute respiratory failure with hypoxia: Secondary | ICD-10-CM | POA: Diagnosis present

## 2021-09-17 DIAGNOSIS — G43909 Migraine, unspecified, not intractable, without status migrainosus: Secondary | ICD-10-CM | POA: Diagnosis present

## 2021-09-17 DIAGNOSIS — I5043 Acute on chronic combined systolic (congestive) and diastolic (congestive) heart failure: Principal | ICD-10-CM | POA: Diagnosis present

## 2021-09-17 DIAGNOSIS — I152 Hypertension secondary to endocrine disorders: Secondary | ICD-10-CM | POA: Diagnosis present

## 2021-09-17 DIAGNOSIS — Z20822 Contact with and (suspected) exposure to covid-19: Secondary | ICD-10-CM | POA: Diagnosis present

## 2021-09-17 DIAGNOSIS — F419 Anxiety disorder, unspecified: Secondary | ICD-10-CM | POA: Diagnosis present

## 2021-09-17 DIAGNOSIS — I10 Essential (primary) hypertension: Secondary | ICD-10-CM | POA: Diagnosis not present

## 2021-09-17 DIAGNOSIS — I5082 Biventricular heart failure: Secondary | ICD-10-CM | POA: Diagnosis present

## 2021-09-17 DIAGNOSIS — E119 Type 2 diabetes mellitus without complications: Secondary | ICD-10-CM

## 2021-09-17 LAB — CBC WITH DIFFERENTIAL/PLATELET
Abs Immature Granulocytes: 0.08 10*3/uL — ABNORMAL HIGH (ref 0.00–0.07)
Basophils Absolute: 0.1 10*3/uL (ref 0.0–0.1)
Basophils Relative: 0 %
Eosinophils Absolute: 0 10*3/uL (ref 0.0–0.5)
Eosinophils Relative: 0 %
HCT: 37 % (ref 36.0–46.0)
Hemoglobin: 11.8 g/dL — ABNORMAL LOW (ref 12.0–15.0)
Immature Granulocytes: 1 %
Lymphocytes Relative: 20 %
Lymphs Abs: 2.9 10*3/uL (ref 0.7–4.0)
MCH: 24.3 pg — ABNORMAL LOW (ref 26.0–34.0)
MCHC: 31.9 g/dL (ref 30.0–36.0)
MCV: 76.1 fL — ABNORMAL LOW (ref 80.0–100.0)
Monocytes Absolute: 0.6 10*3/uL (ref 0.1–1.0)
Monocytes Relative: 4 %
Neutro Abs: 10.9 10*3/uL — ABNORMAL HIGH (ref 1.7–7.7)
Neutrophils Relative %: 75 %
Platelets: 394 10*3/uL (ref 150–400)
RBC: 4.86 MIL/uL (ref 3.87–5.11)
RDW: 16.5 % — ABNORMAL HIGH (ref 11.5–15.5)
WBC: 14.5 10*3/uL — ABNORMAL HIGH (ref 4.0–10.5)
nRBC: 0 % (ref 0.0–0.2)

## 2021-09-17 LAB — I-STAT ARTERIAL BLOOD GAS, ED
Acid-base deficit: 1 mmol/L (ref 0.0–2.0)
Bicarbonate: 22 mmol/L (ref 20.0–28.0)
Calcium, Ion: 1.24 mmol/L (ref 1.15–1.40)
HCT: 36 % (ref 36.0–46.0)
Hemoglobin: 12.2 g/dL (ref 12.0–15.0)
O2 Saturation: 99 %
Patient temperature: 98.6
Potassium: 4.1 mmol/L (ref 3.5–5.1)
Sodium: 136 mmol/L (ref 135–145)
TCO2: 23 mmol/L (ref 22–32)
pCO2 arterial: 31.6 mmHg — ABNORMAL LOW (ref 32–48)
pH, Arterial: 7.451 — ABNORMAL HIGH (ref 7.35–7.45)
pO2, Arterial: 114 mmHg — ABNORMAL HIGH (ref 83–108)

## 2021-09-17 LAB — COMPREHENSIVE METABOLIC PANEL
ALT: 15 U/L (ref 0–44)
AST: 23 U/L (ref 15–41)
Albumin: 2.8 g/dL — ABNORMAL LOW (ref 3.5–5.0)
Alkaline Phosphatase: 99 U/L (ref 38–126)
Anion gap: 9 (ref 5–15)
BUN: 10 mg/dL (ref 6–20)
CO2: 20 mmol/L — ABNORMAL LOW (ref 22–32)
Calcium: 8.8 mg/dL — ABNORMAL LOW (ref 8.9–10.3)
Chloride: 104 mmol/L (ref 98–111)
Creatinine, Ser: 0.91 mg/dL (ref 0.44–1.00)
GFR, Estimated: >60 mL/min (ref >60)
Glucose, Bld: 291 mg/dL — ABNORMAL HIGH (ref 70–99)
Potassium: 3.9 mmol/L (ref 3.5–5.1)
Sodium: 133 mmol/L — ABNORMAL LOW (ref 135–145)
Total Bilirubin: 0.6 mg/dL (ref 0.3–1.2)
Total Protein: 7.1 g/dL (ref 6.5–8.1)

## 2021-09-17 LAB — DIGOXIN LEVEL: Digoxin Level: <0.2 ng/mL — ABNORMAL LOW (ref 0.8–2.0)

## 2021-09-17 LAB — RESP PANEL BY RT-PCR (FLU A&B, COVID) ARPGX2
Influenza A by PCR: NEGATIVE
Influenza B by PCR: NEGATIVE
SARS Coronavirus 2 by RT PCR: NEGATIVE

## 2021-09-17 LAB — PROTIME-INR
INR: 1.4 — ABNORMAL HIGH (ref 0.8–1.2)
Prothrombin Time: 17.5 seconds — ABNORMAL HIGH (ref 11.4–15.2)

## 2021-09-17 LAB — TROPONIN I (HIGH SENSITIVITY): Troponin I (High Sensitivity): 23 ng/L — ABNORMAL HIGH (ref <18)

## 2021-09-17 LAB — BRAIN NATRIURETIC PEPTIDE: B Natriuretic Peptide: 304 pg/mL — ABNORMAL HIGH (ref 0.0–100.0)

## 2021-09-17 LAB — APTT: aPTT: 31 seconds (ref 24–36)

## 2021-09-17 MED ORDER — ONDANSETRON HCL 4 MG/2ML IJ SOLN
4.0000 mg | Freq: Once | INTRAMUSCULAR | Status: AC
  Administered 2021-09-17: 4 mg via INTRAVENOUS
  Filled 2021-09-17: qty 2

## 2021-09-17 MED ORDER — FUROSEMIDE 10 MG/ML IJ SOLN
40.0000 mg | Freq: Once | INTRAMUSCULAR | Status: AC
  Administered 2021-09-17: 40 mg via INTRAVENOUS
  Filled 2021-09-17: qty 4

## 2021-09-17 MED ORDER — FENTANYL CITRATE PF 50 MCG/ML IJ SOSY
50.0000 ug | PREFILLED_SYRINGE | Freq: Once | INTRAMUSCULAR | Status: AC
  Administered 2021-09-17: 50 ug via INTRAVENOUS
  Filled 2021-09-17: qty 1

## 2021-09-17 MED ORDER — IOHEXOL 350 MG/ML SOLN
80.0000 mL | Freq: Once | INTRAVENOUS | Status: AC | PRN
  Administered 2021-09-17: 80 mL via INTRAVENOUS

## 2021-09-17 NOTE — ED Provider Notes (Signed)
[facility] EMERGENCY DEPARTMENT Provider Note   CSN: [id number] Arrival date & time: 09/17/21  2035     History  Chief Complaint  Patient presents with   Shortness of Breath    [name] is a 46 y.o. female with a past medical history of acute CHF, atrial fibrillation, hypertension, diabetes, asthma.  Presents to the emergency department with a chief complaint of shortness of breath and chest pain.  Patient reports she has had shortness of breath and chest pain over the last 4 days.  States that chest pain is midsternal and does not radiate.  She describes pain as a pressure.  Pain does not radiate.  Patient rates pain 8/10 on the pain scale.  Also complains of pain to her back however states that he has pain is different from the one on her chest.  Patient reports that shortness of breath has been constant over the last 4 days.  She is having worsening of symptoms with laying flat and exertion.  Patient also endorses paroxysmal nocturnal dyspnea.  She has not noticed any weight gain or swelling to her lower extremities.  Has been taking all medications as prescribed.  Patient has not on any home oxygen.  Patient denies any fever, chills, hemoptysis, cough, palpitations, abdominal pain, nausea, vomiting, diaphoresis.   Shortness of Breath Associated symptoms: chest pain   Associated symptoms: no abdominal pain, no cough, no fever, no headaches, no neck pain, no rash and no vomiting       Home Medications Prior to Admission medications   Medication Sig Start Date End Date Taking? Authorizing Provider  Accu-Chek Softclix Lancets lancets Use as instructed 02/12/19   Caroline More, DO  albuterol (VENTOLIN HFA) 108 (90 Base) MCG/ACT inhaler Inhale 2 puffs into the lungs every 6 (six) hours as needed for wheezing or shortness of breath. 09/16/21 09/16/22  Lilland, Lorrin Goodell, DO  atorvastatin (LIPITOR) 40 MG tablet TAKE 1 TABLET BY MOUTH DAILY AT 6 PM. 04/01/20 08/27/22   Larey Dresser, MD  blood glucose meter kit and supplies KIT Dispense based on patient and insurance preference. Use up to four times daily as directed. (FOR ICD-9 250.00, 250.01). 09/05/20   Rai, Ripudeep K, MD  Blood Glucose Monitoring Suppl (ACCU-CHEK AVIVA PLUS) w/Device KIT 1 application by Does not apply route QID. Check fasting sugars as well as throughout the day 02/12/19   Caroline More, DO  busPIRone (BUSPAR) 7.5 MG tablet Take 7.5 mg by mouth 2 (two) times daily. Patient not taking: Reported on 09/02/2021 06/03/20   [provider]  carvedilol (COREG) 25 MG tablet TAKE 1 TABLET (25 MG TOTAL) BY MOUTH 2 (TWO) TIMES DAILY WITH A MEAL. 01/23/20 08/27/22  Larey Dresser, MD  Continuous Blood Gluc Receiver (FREESTYLE LIBRE 2 READER) DEVI Use as directed 09/02/21   Zenia Resides, MD  Continuous Blood Gluc Sensor (FREESTYLE LIBRE 2 SENSOR) MISC Use as directed - replace every 14 days 09/02/21   Zenia Resides, MD  digoxin (LANOXIN) 0.125 MG tablet Take 1 tablet (0.125 mg total) by mouth daily. 11/15/19   Lyda Jester M, PA-C  empagliflozin (JARDIANCE) 25 MG TABS tablet Take 25 mg by mouth daily. 10/03/19   Pokhrel, Corrie Mckusick, MD  gabapentin (NEURONTIN) 100 MG capsule Take 100 mg by mouth 2 (two) times daily.    [provider]  glucose blood (ACCU-CHEK AVIVA PLUS) test strip Check CBG 2 times per day 08/16/19   [name], DO  glucose  blood (AGAMATRIX PRESTO TEST) test strip Use as instructed 10/23/17   Zenia Resides, MD  hydrALAZINE (APRESOLINE) 50 MG tablet Take 1 tablet (50 mg total) by mouth 3 (three) times daily. 08/27/21   [place], Maricela Bo, FNP  hydrOXYzine (ATARAX) 10 MG tablet Take 1 tablet (10 mg total) by mouth 3 (three) times daily as needed. 08/10/21   Gifford Shave, MD  insulin aspart (NOVOLOG FLEXPEN) 100 UNIT/ML FlexPen Inject 12 Units into the skin daily before supper. 09/02/21   Zenia Resides, MD  insulin glargine (LANTUS) 100 UNIT/ML Solostar Pen  Inject 36 Units into the skin every morning. 09/02/21   Zenia Resides, MD  Insulin Pen Needle (UNIFINE PENTIPS) 31G X 5 MM MISC USE AS DIRECTED 2 TIMES DAILY 09/02/21   Zenia Resides, MD  Insulin Syringe-Needle U-100 (INSULIN SYRINGE .3CC/31GX5/16") 31G X 5/16" 0.3 ML MISC 15 Units by Does not apply route 2 (two) times daily. 09/05/20   Rai, Vernelle Emerald, MD  Lancets Misc. (ACCU-CHEK SOFTCLIX LANCET DEV) KIT 1 application by Does not apply route 2 (two) times daily. 08/16/19   [name], DO  meclizine (ANTIVERT) 12.5 MG tablet Take 1 tablet (12.5 mg total) by mouth 3 (three) times daily as needed for dizziness. 09/19/19   [name], DO  potassium chloride SA (KLOR-CON) 20 MEQ tablet Take 20 mEq by mouth See admin instructions. Take 1 tablet by mouth every Monday Wednesday and Friday    [provider]  sacubitril-valsartan (ENTRESTO) 97-103 MG TAKE 1 TABLET BY MOUTH 2 (TWO) TIMES DAILY. 10/15/20 10/15/21  Larey Dresser, MD  sertraline (ZOLOFT) 25 MG tablet Take 1 tablet (25 mg total) by mouth daily. 08/31/21   Gifford Shave, MD  spironolactone (ALDACTONE) 25 MG tablet Take 25 mg by mouth daily.    [provider]  torsemide (DEMADEX) 20 MG tablet Take 5 tablets (100 mg total) by mouth daily. 09/06/20   Rai, Vernelle Emerald, MD  traZODone (DESYREL) 50 MG tablet Take 1 tablet (50 mg total) by mouth at bedtime as needed for sleep. 08/10/21   Gifford Shave, MD  warfarin (COUMADIN) 7.5 MG tablet Take 1.5 tablets (11.37m) by mouth daily or as directed by Anticoagulation Clinic. 08/27/21   MLarey Dresser MD      Allergies    Shellfish allergy, Dulaglutide, Metformin and related, and Ibuprofen    Review of Systems   Review of Systems  Constitutional:  Negative for chills and fever.  Eyes:  Negative for visual disturbance.  Respiratory:  Positive for shortness of breath. Negative for cough.   Cardiovascular:  Positive for chest pain. Negative for palpitations and leg swelling.   Gastrointestinal:  Negative for abdominal pain, nausea and vomiting.  Musculoskeletal:  Positive for back pain. Negative for neck pain.  Skin:  Negative for color change and rash.  Neurological:  Negative for dizziness, syncope, light-headedness and headaches.  Psychiatric/Behavioral:  Negative for confusion.    Physical Exam Updated Vital Signs BP (!) 159/117 (BP Location: Right Arm)    Pulse (!) 113    Temp 98.5 F (36.9 C) (Oral)    Resp (!) 27    Ht '4\' 11"'  (1.499 m)    Wt 83 kg    SpO2 100%    BMI 36.96 kg/m  Physical Exam Vitals and nursing note reviewed.  Constitutional:      General: She is not in acute distress.    Appearance: She is not ill-appearing, toxic-appearing or diaphoretic.  Interventions: Nasal cannula in place.     Comments: On 2 LPM of O2 via nasal cannula  HENT:     Head: Normocephalic.  Eyes:     General: No scleral icterus.       Right eye: No discharge.        Left eye: No discharge.  Cardiovascular:     Rate and Rhythm: Tachycardia present.     Pulses:          Radial pulses are 2+ on the right side and 2+ on the left side.  Pulmonary:     Effort: Tachypnea present.     Breath sounds: Normal breath sounds. No stridor.     Comments: Patient has increased effort of breathing. Abdominal:     General: There is no distension. There are no signs of injury.     Palpations: Abdomen is soft. There is no mass or pulsatile mass.     Tenderness: There is no abdominal tenderness. There is no guarding or rebound.  Musculoskeletal:     Right lower leg: No edema.     Left lower leg: No edema.  Skin:    General: Skin is warm and dry.  Neurological:     General: No focal deficit present.     Mental Status: She is alert.     GCS: GCS eye subscore is 4. GCS verbal subscore is 5. GCS motor subscore is 6.  Psychiatric:        Behavior: Behavior is cooperative.    ED Results / Procedures / Treatments   Labs (all labs ordered are listed, but only abnormal  results are displayed) Labs Reviewed  CBC WITH DIFFERENTIAL/PLATELET - Abnormal; Notable for the following components:      Result Value   WBC 14.5 (*)    Hemoglobin 11.8 (*)    MCV 76.1 (*)    MCH 24.3 (*)    RDW 16.5 (*)    Neutro Abs 10.9 (*)    Abs Immature Granulocytes 0.08 (*)    All other components within normal limits  PROTIME-INR - Abnormal; Notable for the following components:   Prothrombin Time 17.5 (*)    INR 1.4 (*)    All other components within normal limits  RESP PANEL BY RT-PCR (FLU A&B, COVID) ARPGX2  APTT  BRAIN NATRIURETIC PEPTIDE  COMPREHENSIVE METABOLIC PANEL  DIGOXIN LEVEL  I-STAT ARTERIAL BLOOD GAS, ED  TROPONIN I (HIGH SENSITIVITY)    EKG EKG Interpretation  Date/Time:  Friday September 17 2021 20:45:46 EST Ventricular Rate:  115 PR Interval:  143 QRS Duration: 85 QT Interval:  332 QTC Calculation: 460 R Axis:   -17 Text Interpretation: Sinus tachycardia Probable LVH with secondary repol abnrm Since last tracing rate faster Confirmed by Calvert Cantor [phone]) on 09/17/2021 9:03:53 PM  Radiology DG Chest Portable 1 View  Result Date: 09/17/2021 CLINICAL DATA:  Shortness of breath EXAM: PORTABLE CHEST 1 VIEW COMPARISON:  08/12/2020 FINDINGS: Cardiomegaly, vascular congestion and diffuse bilateral interstitial and airspace opacities concerning for edema. No effusions. No acute bony abnormality. IMPRESSION: Cardiomegaly, mild CHF. Electronically Signed   By: Rolm Baptise M.D.   On: 09/17/2021 21:16    Procedures Procedures    Medications Ordered in ED Medications  fentaNYL (SUBLIMAZE) injection 50 mcg (50 mcg Intravenous Given 09/17/21 2130)    ED Course/ Medical Decision Making/ A&P  Medical Decision Making Amount and/or Complexity of Data Reviewed Labs: ordered. Radiology: ordered.  Risk Prescription drug management.   Alert 46 year old female no acute distress, nontoxic-appearing.  Patient has increased  effort of breathing.  On 2 LPM of O2 via nasal cannula.  Information was obtained from patient.  Past medical records were reviewed including previous provider notes, labs, and imaging.  Patient has medical history as noted in HPI which complicates care.  Patient noted to be tachycardic and had tachypnea with increased effort of breathing.  Differential includes but is not limited to ACS, acute CHF, PE, atrial fibrillation.  Chest x-ray was ordered and independently interpreted myself.  X-ray shows some cardiomegaly with no pulmonary edema or effusion.  Lab testing was independently interpreted by myself.  Pertinent findings include: -Leukocytosis 14.5  Patient reports improvement in pain after receiving fentanyl.  At time of handoff lab and CTA chest pending.  Patient care transferred to Dr.Sheldom at the end of my shift. Patient presentation, ED course, and plan of care discussed with review of all pertinent labs and imaging. Please see his/her note for further details regarding further ED course and disposition.         Final Clinical Impression(s) / ED Diagnoses Final diagnoses:  None    Rx / DC Orders ED Discharge Orders     None         Dyann Ruddle 09/17/21 2205    Truddie Hidden, MD 09/17/21 2252

## 2021-09-17 NOTE — ED Triage Notes (Signed)
Pt bib [facility] from home c/o SOB for 3 days, worse with exertion. Hx CHF, lung sounds clear per EMS. O2 100 2L [place], EKG showed NSR. BP 174/116, CBG 224, pt did not take nighttime meds. Pt axo x4. 20 G LAC PTA.

## 2021-09-17 NOTE — ED Provider Notes (Signed)
Patient seen and examined, agree with the assessment and plan by APP. Patient with multiple medical problems including HFrEF, Afib on coumadin here with CP, SOB, back pain.   Her labs show ABG with resp alkalosis from tachypnea. CBC has mild anemia. CMP has hyperglycemia. INR is subtherapeutic. Digoxin is subtherapeutic. Trop is mildly elevated, will check a delta for trending. BNP mildly elevated. I personally viewed the images from radiology studies and agree with radiologist interpretation: CXR with mild CHF. CTA neg for PE, also shows pulm edema. Will begin Lasix, she is on supplemental oxygen for comfort and tachypnea, no documented hypoxia.   [facility] consulted for admission.    Truddie Hidden, MD 09/17/21 2322

## 2021-09-17 NOTE — H&P (Addendum)
[name]: [phone]  Patient name: [name] Medical record number: [id number] Date of birth: 12/13/1975 Age: 46 y.o. Gender: female  Primary Care Provider: Gifford Shave, MD Consultants: None Code Status: DNR Preferred Emergency Contact: Patient declines an emergency contact  Chief Complaint: Shortness of breath  Assessment and Plan: [name] is a 46 y.o. female presenting with increased work of breathing. PMH is significant for T2DM, HFrEF, PAF, PCOS, HTN, OSA, HLD, anxiety, obesity, and asthma  Respiratory distress, likely HFrEF exacerbation   asthma Patient with HFrEF presents with increased work of breathing, tachypnea, tachycardia, and hypertension. Labs are significant for blood pH 7.45, PCO2 decreased to 31.6 PO2 elevated to 114, leukocytosis (14.5), BNP of 304, troponin downtrending (23 >17).  CXR shows vascular congestion and edema consistent with CHF and cardiomegaly.  CT is negative for PE.  On admission patient remained tachypneic and tachycardic on 2 L O2 via nasal cannula with oxygen saturations in the high 90s (O2 in place for comfort).  Echocardiogram from 05/26/2020 showed LVEF of 35 to 40% with global hypokinesis.  Received IV Lasix in the ER, will likely transition to torsemide for further diuresis as it is her home medication.  Respiratory distress is likely due to HFrEF exacerbation which may or may not be related to illness as leukocytosis is present, although patient remains afebrile, CXR not concerning for pneumonia.  Patient's asthma could also be contributing to respiratory distress.  We will continue home medications and consider breathing treatments if appropriate.  - Admit to med telemetry, attending Dr. McDiarmid - Continue home carvedilol 25 mg BID - Holding home torsemide 20 mg 5 times daily - Continue home Entresto 97-103 mg BID - Continue home spironolactone 25 mg  daily - Follow-up echocardiogram - Supplemental oxygen as needed - Keep oxygen saturation greater than 92% - Continue home albuterol 2 puffs q6h pRN - Vitals per routine - Continuous cardiac monitoring and pulse ox - Strict Is&Os - Daily weights - Monitor UOP and re-dose diuretics as appropriate - Consider cardiology consult  Chest pain/pressure Ischemic cardiac work-up so far negative.  Troponin flat, ECG the shows sinus tachycardia.  Chest pain likely due to respiratory distress.  Obesity could be a contributing factor.  Can consider pericarditis as differential.  Echo pending.  Patient received fentanyl x3 in the ED which caused nausea and vomiting. - Continuous cardiac monitoring - Monitor chest pain closely - Tylenol 650 mg q6h PRN - Follow-up echocardiogram  HTN Blood pressures elevated to 120s-170s/90s-130s. - Continue hydralazine 50 mg TID - Continue Coreg 25 mg BID - Continue spironolactone 25 mg daily  T2DM Blood glucose of 291.  Home medications include Jardiance 25 mg daily, NovoLog 12 units daily before dinner, Lantus 36 units every morning.  Patient also takes gabapentin 100 mg twice daily. Will hold home medications due to poor oral intake and adjust regimen as needed.  - moderate SSI - CBG checks before every meal and nightly - Continue home gabapentin  PAF ECG shows sinus tachycardia. INR subtherapeutic (goal 2-3).  Auscultation difficult due to body habitus. - Continue home carvedilol 25 mg BID - Continue home digoxin 0.125 mg daily - Pharmacy consult for warfarin  HLD Lipid panel done in 2021. - Continue home atorvastatin 40 mg daily  Anxiety - Continue home Zoloft 25 mg daily - Atarax 10 mg 3 times daily as needed    FEN/GI: Regular diet Prophylaxis: Warfarin  Disposition: Med  telemetry  History of Present Illness:  [name] is a 46 y.o. female presenting with shortness of breath.  Trouble breathing for the last 4 days and has  progressively worsened. She reports taking all of her home medications, she does note there have been medication changes in the last several weeks though.   Reports chest and back pain (middle back). Chest pain has been present since her breathing worsened but tonight it was worsening. Pain feels like a lot of pressure in the middle of her chest, it does not radiate. The back pain also started around the same time, her friend has been doing massages to try and help.  No tobacco/alcohol/drug use.  Baseline weight 183lbs.  Patient reports she was weighing herself at home and got up to 189 pounds.  Does not notice any leg swelling but has noticed her belly is larger than usual.  She is having normal bowel movements and urinating frequently.    Review Of Systems: Per HPI with the following additions:   Review of Systems  Constitutional:  Negative for fever.  HENT:  Negative for congestion, rhinorrhea and sore throat.   Respiratory:  Positive for chest tightness and shortness of breath.   Cardiovascular:  Positive for chest pain. Negative for leg swelling.  Gastrointestinal:  Positive for abdominal pain, nausea (since getting meds in the ER) and vomiting. Negative for constipation and diarrhea.  Genitourinary:  Negative for difficulty urinating and hematuria.  Skin:  Negative for rash.  Neurological:  Positive for headaches.    Patient Active Problem List   Diagnosis Date Noted   DKA (diabetic ketoacidosis) ([place]) 09/04/2020   Hypokalemia 10/13/2019   Hyponatremia 10/13/2019   Encounter for therapeutic drug monitoring 10/07/2019   Acute on chronic congestive heart failure ([place])    Hypertensive urgency 09/30/2019   Elevated troponin 09/30/2019   Hypertensive emergency 09/30/2019   Attempting to conceive 03/10/2019   Acute on chronic systolic CHF (congestive heart failure) ([place]) 02/19/2019   Asthma    Yeast infection 02/06/2019   Right ventricular failure ([place]) 02/06/2019   Morbid obesity  ([place]) 02/06/2019   HFrEF (heart failure with reduced ejection fraction) ([place]) 12/31/2018   Paroxysmal atrial fibrillation ([place]) 12/31/2018   Anxiety 12/31/2018   Hyperlipidemia associated with type 2 diabetes mellitus ([place]) 10/30/2017   Obstructive sleep apnea 06/30/2015   Anemia due to blood loss, chronic 06/27/2012   Allergic rhinitis 06/27/2012   Major depressive disorder 02/08/2012   Hypertension associated with diabetes ([place]) 12/04/2011   Dysmenorrhea 11/22/2011   Vaginal yeast infection 05/19/2011   DM2 (diabetes mellitus, type 2) ([place]) 05/19/2011   PCOS (polycystic ovarian syndrome) 05/19/2011   Hydrosalpinx 08/21/2009    Past Medical History: Past Medical History:  Diagnosis Date   Abnormal Pap smear    Acute on chronic combined systolic and diastolic CHF (congestive heart failure) ([place]) 02/19/2019   Acute on chronic congestive heart failure (HCC)    Anemia    Asthma    Atrial fibrillation ([place])    Diabetes mellitus    DKA, type 2 ([place]) 02/06/2019   Heart failure with reduced ejection fraction ([place])    Hypertension    Major depressive disorder 02/08/2012   Reports that 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     Migraines    Morbid obesity ([place]) 02/06/2019   Obstructive sleep apnea 06/30/2015   Polycystic ovarian disease     Past Surgical  History: Past Surgical History:  Procedure Laterality Date   CERVIX LESION DESTRUCTION     CESAREAN SECTION     RIGHT/LEFT HEART CATH AND CORONARY ANGIOGRAPHY N/A 03/29/2019   Procedure: RIGHT/LEFT HEART CATH AND CORONARY ANGIOGRAPHY;  Surgeon: Larey Dresser, MD;  Location: [facility];  Service: Cardiovascular;  Laterality: N/A;    Social History: Social History   Tobacco Use   Smoking status: Never   Smokeless tobacco: Never  Vaping Use   Vaping Use: Never used  Substance Use Topics   Alcohol use: No    Alcohol/week: 0.0 standard drinks   Drug use: No     Family History: Family History  Problem Relation Age of Onset   Diabetes Mother    Hypertension Mother    Hypertension Father    Diabetes Father      Allergies and Medications: Allergies  Allergen Reactions   Shellfish Allergy Swelling    Airway involvement including EMS - Has Epi-Pen   Dulaglutide Nausea And Vomiting and Other (See Comments)    Multiple episodes of vomiting over multiple days   Metformin And Related Diarrhea   Ibuprofen Other (See Comments)    Per PCP interferes with daily meds (heart failure)   No current facility-administered medications on file prior to encounter.   Current Outpatient Medications on File Prior to Encounter  Medication Sig Dispense Refill   Accu-Chek Softclix Lancets lancets Use as instructed 100 each 12   albuterol (VENTOLIN HFA) 108 (90 Base) MCG/ACT inhaler Inhale 2 puffs into the lungs every 6 (six) hours as needed for wheezing or shortness of breath. 18 g 1   atorvastatin (LIPITOR) 40 MG tablet TAKE 1 TABLET BY MOUTH DAILY AT 6 PM. 30 tablet 2   blood glucose meter kit and supplies KIT Dispense based on patient and insurance preference. Use up to four times daily as directed. (FOR ICD-9 250.00, 250.01). 1 each 0   Blood Glucose Monitoring Suppl (ACCU-CHEK AVIVA PLUS) w/Device KIT 1 application by Does not apply route QID. Check fasting sugars as well as throughout the day 1 kit 0   busPIRone (BUSPAR) 7.5 MG tablet Take 7.5 mg by mouth 2 (two) times daily. (Patient not taking: Reported on 09/02/2021)     carvedilol (COREG) 25 MG tablet TAKE 1 TABLET (25 MG TOTAL) BY MOUTH 2 (TWO) TIMES DAILY WITH A MEAL. 180 tablet 3   Continuous Blood Gluc Receiver (FREESTYLE LIBRE 2 READER) DEVI Use as directed 1 each 0   Continuous Blood Gluc Sensor (FREESTYLE LIBRE 2 SENSOR) MISC Use as directed - replace every 14 days 2 each 2   digoxin (LANOXIN) 0.125 MG tablet Take 1 tablet (0.125 mg total) by mouth daily. 30 tablet 3   empagliflozin (JARDIANCE)  25 MG TABS tablet Take 25 mg by mouth daily. 90 tablet 3   gabapentin (NEURONTIN) 100 MG capsule Take 100 mg by mouth 2 (two) times daily.     glucose blood (ACCU-CHEK AVIVA PLUS) test strip Check CBG 2 times per day 100 each 12   glucose blood (AGAMATRIX PRESTO TEST) test strip Use as instructed 100 each 12   hydrALAZINE (APRESOLINE) 50 MG tablet Take 1 tablet (50 mg total) by mouth 3 (three) times daily. 90 tablet 3   hydrOXYzine (ATARAX) 10 MG tablet Take 1 tablet (10 mg total) by mouth 3 (three) times daily as needed. 30 tablet 0   insulin aspart (NOVOLOG FLEXPEN) 100 UNIT/ML FlexPen Inject 12 Units into the skin daily  before supper. 15 mL 11   insulin glargine (LANTUS) 100 UNIT/ML Solostar Pen Inject 36 Units into the skin every morning. 15 mL 11   Insulin Pen Needle (UNIFINE PENTIPS) 31G X 5 MM MISC USE AS DIRECTED 2 TIMES DAILY 100 each 11   Insulin Syringe-Needle U-100 (INSULIN SYRINGE .3CC/31GX5/16") 31G X 5/16" 0.3 ML MISC 15 Units by Does not apply route 2 (two) times daily. 100 each 3   Lancets Misc. (ACCU-CHEK SOFTCLIX LANCET DEV) KIT 1 application by Does not apply route 2 (two) times daily. 1 kit 0   meclizine (ANTIVERT) 12.5 MG tablet Take 1 tablet (12.5 mg total) by mouth 3 (three) times daily as needed for dizziness. 30 tablet 0   potassium chloride SA (KLOR-CON) 20 MEQ tablet Take 20 mEq by mouth See admin instructions. Take 1 tablet by mouth every Monday Wednesday and Friday     sacubitril-valsartan (ENTRESTO) 97-103 MG TAKE 1 TABLET BY MOUTH 2 (TWO) TIMES DAILY. 60 tablet 3   sertraline (ZOLOFT) 25 MG tablet Take 1 tablet (25 mg total) by mouth daily. 30 tablet 3   spironolactone (ALDACTONE) 25 MG tablet Take 25 mg by mouth daily.     torsemide (DEMADEX) 20 MG tablet Take 5 tablets (100 mg total) by mouth daily.     traZODone (DESYREL) 50 MG tablet Take 1 tablet (50 mg total) by mouth at bedtime as needed for sleep. 30 tablet 0   warfarin (COUMADIN) 7.5 MG tablet Take 1.5  tablets (11.95m) by mouth daily or as directed by Anticoagulation Clinic. 50 tablet 1    Objective: BP (!) 163/118    Pulse (!) 109    Temp 98.5 F (36.9 C) (Oral)    Resp 11    Ht _0  (1.499 m)    Wt 83 kg    SpO2 96%    BMI 36.96 kg/m  Exam: General: Patient sitting up in bed, mild distress, vomiting Eyes: White sclera, clear conjunctiva ENTM: Patient vomiting during exam Cardiovascular: Difficult to auscultate due to body habitus Respiratory: Difficult to auscultate due to body habitus and vomiting, and moaning sounds Gastrointestinal: Soft, nontender to palpation, nondistended Extremities: No edema of BLEs Derm: Warm and dry Neuro: No focal deficits Psych: Mood and affect appropriate for situation  Labs and Imaging: CBC BMET  Recent Labs  Lab 09/17/21 2115 09/17/21 2204  WBC 14.5*  --   HGB 11.8* 12.2  HCT 37.0 36.0  PLT 394  --    Recent Labs  Lab 09/17/21 2115 09/17/21 2204  NA 133* 136  K 3.9 4.1  CL 104  --   CO2 20*  --   BUN 10  --   CREATININE 0.91  --   GLUCOSE 291*  --   CALCIUM 8.8*  --      EKG: Sinus tachycardia, heart rate 115, QTc 460    JNatania, Finigan DO 09/17/2021, 11:53 PM PGY-1, CEast DukeIntern [name]: [phone] text pages welcome      FPTS Upper-Level Resident Addendum   I have independently interviewed and examined the patient. I have discussed the above with the original author and agree with their documentation. My edits for correction/addition/clarification are in within the document. Please see also any attending notes.   ARise Patience DO  PGY-2, CRanch[facility] FeFamily Medicine 09/18/2021 2:02 AM  FPTS Service [name]: [phone](text pages welcome through [facility]

## 2021-09-17 NOTE — Progress Notes (Addendum)
RT in to collect ABG. Pt sitting on knees facing the back of the stretcher, c/o back pain, chest pain, unable to get in comfortable position. RT will check back to draw blood once pt has had something for pain. PA aware.

## 2021-09-18 ENCOUNTER — Encounter (HOSPITAL_COMMUNITY): Payer: Self-pay | Admitting: Family Medicine

## 2021-09-18 ENCOUNTER — Inpatient Hospital Stay (HOSPITAL_COMMUNITY): Payer: Medicaid Other

## 2021-09-18 DIAGNOSIS — I5043 Acute on chronic combined systolic (congestive) and diastolic (congestive) heart failure: Secondary | ICD-10-CM | POA: Diagnosis not present

## 2021-09-18 DIAGNOSIS — I2510 Atherosclerotic heart disease of native coronary artery without angina pectoris: Secondary | ICD-10-CM | POA: Diagnosis not present

## 2021-09-18 DIAGNOSIS — Z8249 Family history of ischemic heart disease and other diseases of the circulatory system: Secondary | ICD-10-CM | POA: Diagnosis not present

## 2021-09-18 DIAGNOSIS — I480 Paroxysmal atrial fibrillation: Secondary | ICD-10-CM | POA: Diagnosis not present

## 2021-09-18 DIAGNOSIS — I428 Other cardiomyopathies: Secondary | ICD-10-CM | POA: Diagnosis not present

## 2021-09-18 DIAGNOSIS — Z7984 Long term (current) use of oral hypoglycemic drugs: Secondary | ICD-10-CM | POA: Diagnosis not present

## 2021-09-18 DIAGNOSIS — I5023 Acute on chronic systolic (congestive) heart failure: Secondary | ICD-10-CM | POA: Diagnosis not present

## 2021-09-18 DIAGNOSIS — E1169 Type 2 diabetes mellitus with other specified complication: Secondary | ICD-10-CM | POA: Diagnosis not present

## 2021-09-18 DIAGNOSIS — R0602 Shortness of breath: Secondary | ICD-10-CM | POA: Diagnosis not present

## 2021-09-18 DIAGNOSIS — E1165 Type 2 diabetes mellitus with hyperglycemia: Secondary | ICD-10-CM | POA: Diagnosis not present

## 2021-09-18 DIAGNOSIS — I5021 Acute systolic (congestive) heart failure: Secondary | ICD-10-CM

## 2021-09-18 DIAGNOSIS — I509 Heart failure, unspecified: Secondary | ICD-10-CM | POA: Diagnosis not present

## 2021-09-18 DIAGNOSIS — E282 Polycystic ovarian syndrome: Secondary | ICD-10-CM | POA: Diagnosis not present

## 2021-09-18 DIAGNOSIS — Z79899 Other long term (current) drug therapy: Secondary | ICD-10-CM | POA: Diagnosis not present

## 2021-09-18 DIAGNOSIS — I517 Cardiomegaly: Secondary | ICD-10-CM | POA: Diagnosis not present

## 2021-09-18 DIAGNOSIS — E119 Type 2 diabetes mellitus without complications: Secondary | ICD-10-CM

## 2021-09-18 DIAGNOSIS — Z20822 Contact with and (suspected) exposure to covid-19: Secondary | ICD-10-CM | POA: Diagnosis not present

## 2021-09-18 DIAGNOSIS — J9601 Acute respiratory failure with hypoxia: Secondary | ICD-10-CM | POA: Diagnosis not present

## 2021-09-18 DIAGNOSIS — I5082 Biventricular heart failure: Secondary | ICD-10-CM | POA: Diagnosis not present

## 2021-09-18 DIAGNOSIS — I110 Hypertensive heart disease with heart failure: Secondary | ICD-10-CM | POA: Diagnosis not present

## 2021-09-18 DIAGNOSIS — F4321 Adjustment disorder with depressed mood: Secondary | ICD-10-CM | POA: Diagnosis not present

## 2021-09-18 DIAGNOSIS — Z7901 Long term (current) use of anticoagulants: Secondary | ICD-10-CM | POA: Diagnosis not present

## 2021-09-18 DIAGNOSIS — I152 Hypertension secondary to endocrine disorders: Secondary | ICD-10-CM | POA: Diagnosis not present

## 2021-09-18 DIAGNOSIS — R0789 Other chest pain: Secondary | ICD-10-CM | POA: Diagnosis not present

## 2021-09-18 DIAGNOSIS — Z66 Do not resuscitate: Secondary | ICD-10-CM | POA: Diagnosis not present

## 2021-09-18 DIAGNOSIS — R079 Chest pain, unspecified: Secondary | ICD-10-CM | POA: Diagnosis not present

## 2021-09-18 DIAGNOSIS — E785 Hyperlipidemia, unspecified: Secondary | ICD-10-CM | POA: Diagnosis not present

## 2021-09-18 DIAGNOSIS — F419 Anxiety disorder, unspecified: Secondary | ICD-10-CM | POA: Diagnosis not present

## 2021-09-18 DIAGNOSIS — Z794 Long term (current) use of insulin: Secondary | ICD-10-CM | POA: Diagnosis not present

## 2021-09-18 DIAGNOSIS — I959 Hypotension, unspecified: Secondary | ICD-10-CM | POA: Diagnosis not present

## 2021-09-18 DIAGNOSIS — G4733 Obstructive sleep apnea (adult) (pediatric): Secondary | ICD-10-CM | POA: Diagnosis not present

## 2021-09-18 DIAGNOSIS — I5020 Unspecified systolic (congestive) heart failure: Secondary | ICD-10-CM | POA: Diagnosis not present

## 2021-09-18 DIAGNOSIS — G43909 Migraine, unspecified, not intractable, without status migrainosus: Secondary | ICD-10-CM | POA: Diagnosis not present

## 2021-09-18 LAB — CBC
HCT: 34.4 % — ABNORMAL LOW (ref 36.0–46.0)
Hemoglobin: 11.1 g/dL — ABNORMAL LOW (ref 12.0–15.0)
MCH: 24.1 pg — ABNORMAL LOW (ref 26.0–34.0)
MCHC: 32.3 g/dL (ref 30.0–36.0)
MCV: 74.8 fL — ABNORMAL LOW (ref 80.0–100.0)
Platelets: 354 10*3/uL (ref 150–400)
RBC: 4.6 MIL/uL (ref 3.87–5.11)
RDW: 16.3 % — ABNORMAL HIGH (ref 11.5–15.5)
WBC: 13.6 10*3/uL — ABNORMAL HIGH (ref 4.0–10.5)
nRBC: 0 % (ref 0.0–0.2)

## 2021-09-18 LAB — BASIC METABOLIC PANEL
Anion gap: 12 (ref 5–15)
BUN: 9 mg/dL (ref 6–20)
CO2: 20 mmol/L — ABNORMAL LOW (ref 22–32)
Calcium: 8.8 mg/dL — ABNORMAL LOW (ref 8.9–10.3)
Chloride: 101 mmol/L (ref 98–111)
Creatinine, Ser: 0.86 mg/dL (ref 0.44–1.00)
GFR, Estimated: >60 mL/min (ref >60)
Glucose, Bld: 308 mg/dL — ABNORMAL HIGH (ref 70–99)
Potassium: 4 mmol/L (ref 3.5–5.1)
Sodium: 133 mmol/L — ABNORMAL LOW (ref 135–145)

## 2021-09-18 LAB — PROTIME-INR
INR: 1.5 — ABNORMAL HIGH (ref 0.8–1.2)
Prothrombin Time: 18.5 seconds — ABNORMAL HIGH (ref 11.4–15.2)

## 2021-09-18 LAB — ECHOCARDIOGRAM COMPLETE
Area-P 1/2: 5.62 cm2
Height: 59 in
S' Lateral: 4.4 cm
Weight: 2987.2 oz

## 2021-09-18 LAB — GLUCOSE, CAPILLARY
Glucose-Capillary: 229 mg/dL — ABNORMAL HIGH (ref 70–99)
Glucose-Capillary: 250 mg/dL — ABNORMAL HIGH (ref 70–99)
Glucose-Capillary: 119 mg/dL — ABNORMAL HIGH (ref 70–99)
Glucose-Capillary: 185 mg/dL — ABNORMAL HIGH (ref 70–99)

## 2021-09-18 LAB — LIPID PANEL
Cholesterol: 222 mg/dL — ABNORMAL HIGH (ref 0–200)
HDL: 43 mg/dL (ref >40)
LDL Cholesterol: 160 mg/dL — ABNORMAL HIGH (ref 0–99)
Total CHOL/HDL Ratio: 5.2 RATIO
Triglycerides: 97 mg/dL (ref <150)
VLDL: 19 mg/dL (ref 0–40)

## 2021-09-18 LAB — HIV ANTIBODY (ROUTINE TESTING W REFLEX): HIV Screen 4th Generation wRfx: NONREACTIVE

## 2021-09-18 LAB — TROPONIN I (HIGH SENSITIVITY): Troponin I (High Sensitivity): 17 ng/L (ref <18)

## 2021-09-18 LAB — HEPARIN LEVEL (UNFRACTIONATED): Heparin Unfractionated: <0.1 IU/mL — ABNORMAL LOW (ref 0.30–0.70)

## 2021-09-18 MED ORDER — SPIRONOLACTONE 25 MG PO TABS: 25.0000 mg | ORAL_TABLET | Freq: Every day | ORAL | Status: DC

## 2021-09-18 MED ORDER — SPIRONOLACTONE 12.5 MG HALF TABLET
12.5000 mg | ORAL_TABLET | Freq: Every day | ORAL | Status: DC
  Administered 2021-09-18 – 2021-09-19 (×2): 12.5 mg via ORAL
  Filled 2021-09-18 (×2): qty 1

## 2021-09-18 MED ORDER — DIGOXIN 125 MCG PO TABS
0.1250 mg | ORAL_TABLET | Freq: Every day | ORAL | Status: DC
  Administered 2021-09-18 – 2021-09-22 (×5): 0.125 mg via ORAL
  Filled 2021-09-18 (×5): qty 1

## 2021-09-18 MED ORDER — CARVEDILOL 12.5 MG PO TABS
25.0000 mg | ORAL_TABLET | Freq: Two times a day (BID) | ORAL | Status: DC
  Administered 2021-09-18: 25 mg via ORAL
  Filled 2021-09-18: qty 2

## 2021-09-18 MED ORDER — SERTRALINE HCL 50 MG PO TABS
25.0000 mg | ORAL_TABLET | Freq: Every day | ORAL | Status: DC
  Administered 2021-09-18 – 2021-09-22 (×5): 25 mg via ORAL
  Filled 2021-09-18 (×5): qty 1

## 2021-09-18 MED ORDER — ALBUTEROL SULFATE HFA 108 (90 BASE) MCG/ACT IN AERS: 2.0000 | INHALATION_SPRAY | Freq: Four times a day (QID) | RESPIRATORY_TRACT | Status: DC | PRN

## 2021-09-18 MED ORDER — INSULIN ASPART 100 UNIT/ML IJ SOLN: 0.0000 [IU] | Freq: Three times a day (TID) | INTRAMUSCULAR | Status: DC

## 2021-09-18 MED ORDER — SACUBITRIL-VALSARTAN 97-103 MG PO TABS
1.0000 | ORAL_TABLET | Freq: Two times a day (BID) | ORAL | Status: DC
  Administered 2021-09-18: 1 via ORAL
  Filled 2021-09-18: qty 1

## 2021-09-18 MED ORDER — ACETAMINOPHEN 325 MG PO TABS
650.0000 mg | ORAL_TABLET | Freq: Four times a day (QID) | ORAL | Status: DC | PRN
  Administered 2021-09-20 – 2021-09-21 (×2): 650 mg via ORAL
  Filled 2021-09-18 (×4): qty 2

## 2021-09-18 MED ORDER — WARFARIN SODIUM 10 MG PO TABS: 10.0000 mg | ORAL_TABLET | Freq: Once | ORAL | Status: DC

## 2021-09-18 MED ORDER — ATORVASTATIN CALCIUM 40 MG PO TABS
40.0000 mg | ORAL_TABLET | Freq: Every day | ORAL | Status: DC
  Administered 2021-09-19 – 2021-09-22 (×4): 40 mg via ORAL
  Filled 2021-09-18 (×5): qty 1

## 2021-09-18 MED ORDER — WARFARIN SODIUM 7.5 MG PO TABS
15.0000 mg | ORAL_TABLET | ORAL | Status: AC
  Administered 2021-09-18: 15 mg via ORAL
  Filled 2021-09-18 (×3): qty 2

## 2021-09-18 MED ORDER — HYDRALAZINE HCL 25 MG PO TABS
50.0000 mg | ORAL_TABLET | Freq: Three times a day (TID) | ORAL | Status: DC
Start: 2021-09-18 — End: 2021-09-18
  Administered 2021-09-18: 50 mg via ORAL
  Filled 2021-09-18: qty 2

## 2021-09-18 MED ORDER — CARVEDILOL 12.5 MG PO TABS
12.5000 mg | ORAL_TABLET | Freq: Two times a day (BID) | ORAL | Status: DC
  Administered 2021-09-18 – 2021-09-19 (×2): 12.5 mg via ORAL
  Filled 2021-09-18 (×2): qty 1

## 2021-09-18 MED ORDER — ONDANSETRON 4 MG PO TBDP
4.0000 mg | ORAL_TABLET | Freq: Once | ORAL | Status: AC
Start: 2021-09-18 — End: 2021-09-18
  Administered 2021-09-18: 4 mg via ORAL
  Filled 2021-09-18: qty 1

## 2021-09-18 MED ORDER — INSULIN ASPART 100 UNIT/ML IJ SOLN
0.0000 [IU] | Freq: Three times a day (TID) | INTRAMUSCULAR | Status: DC
  Administered 2021-09-18 – 2021-09-19 (×3): 5 [IU] via SUBCUTANEOUS
  Administered 2021-09-19: 2 [IU] via SUBCUTANEOUS
  Administered 2021-09-20 – 2021-09-21 (×2): 5 [IU] via SUBCUTANEOUS
  Administered 2021-09-21: 3 [IU] via SUBCUTANEOUS
  Administered 2021-09-21: 8 [IU] via SUBCUTANEOUS
  Administered 2021-09-22: 5 [IU] via SUBCUTANEOUS
  Administered 2021-09-22: 3 [IU] via SUBCUTANEOUS

## 2021-09-18 MED ORDER — OXYCODONE HCL 5 MG PO TABS
2.5000 mg | ORAL_TABLET | Freq: Four times a day (QID) | ORAL | Status: DC | PRN
  Administered 2021-09-18 – 2021-09-21 (×7): 2.5 mg via ORAL
  Filled 2021-09-18 (×7): qty 1

## 2021-09-18 MED ORDER — ALBUTEROL SULFATE (2.5 MG/3ML) 0.083% IN NEBU: 2.5000 mg | INHALATION_SOLUTION | RESPIRATORY_TRACT | Status: DC | PRN

## 2021-09-18 MED ORDER — EMPAGLIFLOZIN 25 MG PO TABS
25.0000 mg | ORAL_TABLET | Freq: Every day | ORAL | Status: DC
  Administered 2021-09-18 – 2021-09-22 (×5): 25 mg via ORAL
  Filled 2021-09-18 (×5): qty 1

## 2021-09-18 MED ORDER — WARFARIN - PHARMACIST DOSING INPATIENT
Freq: Every day | Status: DC
Start: 2021-09-18 — End: 2021-09-18

## 2021-09-18 MED ORDER — HEPARIN (PORCINE) 25000 UT/250ML-% IV SOLN
1050.0000 [IU]/h | INTRAVENOUS | Status: DC
  Administered 2021-09-18: 900 [IU]/h via INTRAVENOUS
  Administered 2021-09-19: 13:00:00 1350 [IU]/h via INTRAVENOUS
  Filled 2021-09-18 (×2): qty 250

## 2021-09-18 MED ORDER — INSULIN GLARGINE-YFGN 100 UNIT/ML ~~LOC~~ SOLN
18.0000 [IU] | Freq: Every day | SUBCUTANEOUS | Status: DC
Start: 2021-09-18 — End: 2021-09-22
  Administered 2021-09-18 – 2021-09-22 (×5): 18 [IU] via SUBCUTANEOUS
  Filled 2021-09-18 (×5): qty 0.18

## 2021-09-18 MED ORDER — HYDROXYZINE HCL 10 MG PO TABS
10.0000 mg | ORAL_TABLET | Freq: Three times a day (TID) | ORAL | Status: DC | PRN
  Administered 2021-09-19 – 2021-09-21 (×3): 10 mg via ORAL
  Filled 2021-09-18 (×3): qty 1

## 2021-09-18 MED ORDER — GABAPENTIN 100 MG PO CAPS
100.0000 mg | ORAL_CAPSULE | Freq: Two times a day (BID) | ORAL | Status: DC
  Administered 2021-09-18 – 2021-09-22 (×9): 100 mg via ORAL
  Filled 2021-09-18 (×9): qty 1

## 2021-09-18 MED ORDER — FUROSEMIDE 10 MG/ML IJ SOLN
120.0000 mg | Freq: Once | INTRAVENOUS | Status: AC
  Administered 2021-09-18: 120 mg via INTRAVENOUS
  Filled 2021-09-18: qty 2

## 2021-09-18 MED ORDER — FUROSEMIDE 10 MG/ML IJ SOLN
40.0000 mg | Freq: Two times a day (BID) | INTRAMUSCULAR | Status: DC
  Administered 2021-09-18 – 2021-09-19 (×3): 40 mg via INTRAVENOUS
  Filled 2021-09-18 (×3): qty 4

## 2021-09-18 NOTE — Progress Notes (Signed)
RT in to assess pt for breathing treatment. Pt laying over towards right side of the bed, still appears uncomfortable. [place] untangled from around pt, and readjusted due to only being in one nostril. Pt with good air entry bilaterally, only slightly diminished. Spoke with pt about trying Albuterol inhaler, that though I didn't hear any wheeze, it may help her to breathe more comfortably. Pt does not want inhaler at this time. Reminded her that it is ordered Q6 PRN, and if she changes her mind, to let us know. RT will monitor as needed.

## 2021-09-18 NOTE — Progress Notes (Addendum)
FPTS Brief Progress Note  S Saw patient at bedside this evening. She was laying on her front to help with her breathing. She has been feeling sick and endorses chest pain and back pain which is not new.     O: BP 114/76 (BP Location: Right Arm)    Pulse 86    Temp 98.6 F (37 C) (Oral)    Resp 20    Ht 4\' 11"  (1.499 m)    Wt 84.7 kg    SpO2 100%    BMI 37.71 kg/m    General: Alert, no acute distress, 2LNC Cardio: Normal S1 and S2, RRR, no r/m/g Pulm: CTAB, normal work of breathing Abdomen: Bowel sounds normal. Abdomen soft and non-tender.  Extremities: No peripheral edema.  Neuro: Cranial nerves grossly intact   A/P: Plan per day team  -Tylenol, oxycodone for pain PRN -One time dose 4mg  PO Zofran  -Repeat BP  -RN to page if any concerns overnight  - Orders reviewed. Labs for AM ordered, which was adjusted as needed.  - If condition changes, plan includes bedside evaluation, CXR etc   Lattie Haw, MD 09/18/2021, 7:44 PM PGY-3, [facility] Night Resident  Please page [phone] with questions.

## 2021-09-18 NOTE — Progress Notes (Signed)
FPTS Interim Progress Note  S: Went to bedside to evaluate patient, she states that her symptoms have overall improved since admission. She is still experiencing ongoing chest pressure but otherwise no other concerns this morning.   O: BP 98/62 (BP Location: Right Arm)    Pulse 88    Temp 97.9 F (36.6 C) (Oral)    Resp 18    Ht 4\' 11"  (1.499 m)    Wt 84.7 kg    SpO2 100%    BMI 37.71 kg/m   General: Patient sitting upright in bed, in no acute distress. HEENT: no evidence of JVD CV: RRR, no murmurs or gallops auscultated, chest tenderness elicited on palpation of chest wall Resp: mild, faint crackles noted diffusely, good air movement throughout all lung fields, breathing comfortably on 2L O2 without signs of respiratory distress. Abdomen: soft, nontender, presence of bowel sounds Ext: no pitting edema of LE bilaterally, radial and distal pulses strong and equal bilaterally  A/P: Dyspnea and respiratory distress likely secondary to HFrEF exacerbation -Consulted cardiology. Discussed with Dr. Domenic Polite, who will see patient to best medical optimize diuretic regimen. Appreciate recommendations.  -s/ lasix 40 mg, lasix 120 mg -monitor I/Os and daily weights  -awaiting echo results -continue to hold entresto and spiro, pending further diuresis and cardiology recs  Hypertension Hypertensive upon admission but recently normotensive to hypotensive with recent manual readings.  -held home hydralazine  -continue coreg -monitor BP  Chest pressure Likely secondary to MSK etiology, low concern for cardiac cause given exam findings. -continue to monitor clinically  Type 2 DM CBG 229.  -semglee 18 units  -mSSI -monitor CBGs, adjust regimen as appropriate  All other issues per H&P.   Donney Dice, DO 09/18/2021, 9:02 AM PGY-2, [facility] Service pager [phone]

## 2021-09-18 NOTE — Progress Notes (Signed)
PT Cancellation Note  Patient Details Name: [name] MRN: [id number] DOB: 09/23/1975   Cancelled Treatment:    Reason Eval/Treat Not Completed: Patient at procedure or test/unavailable   [name] 09/18/2021, 9:11 AM Bayard Males, PT Acute Rehabilitation Services Pager: [phone] Office: [phone]

## 2021-09-18 NOTE — Progress Notes (Addendum)
ANTICOAGULATION CONSULT NOTE - Initial Consult  Pharmacy Consult to stop warfarin and start heparin Indication: atrial fibrillation  Allergies  Allergen Reactions   Shellfish Allergy Swelling    Airway involvement including EMS - Has Epi-Pen   Dulaglutide Nausea And Vomiting and Other (See Comments)    Multiple episodes of vomiting over multiple days   Metformin And Related Diarrhea   Ibuprofen Other (See Comments)    Per PCP interferes with daily meds (heart failure)    Patient Measurements: Height: 4\' 11"  (149.9 cm) Weight: 84.7 kg (186 lb 11.2 oz) IBW/kg (Calculated) : 43.2 Heparin dosing weight 62.7  Vital Signs: Temp: 98.2 F (36.8 C) (02/25 1102) Temp Source: Oral (02/25 1102) BP: 105/71 (02/25 1102) Pulse Rate: 93 (02/25 1102)  Labs: Recent Labs    09/17/21 2115 09/17/21 2204 09/17/21 2335 09/18/21 0328 09/18/21 0528  HGB 11.8* 12.2  --   --  11.1*  HCT 37.0 36.0  --   --  34.4*  PLT 394  --   --   --  354  APTT 31  --   --   --   --   LABPROT 17.5*  --   --  18.5*  --   INR 1.4*  --   --  1.5*  --   CREATININE 0.91  --   --  0.86  --   TROPONINIHS 23*  --  17  --   --      Estimated Creatinine Clearance: 79.6 mL/min (by C-G formula based on SCr of 0.86 mg/dL).   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     Migraines    Morbid obesity ([place]) 02/06/2019   Obstructive sleep apnea 06/30/2015   Polycystic ovarian disease     Medications:  Warfarin  11.25mg  daily PTA  Assessment: 46 yo F admitted with shortness of breath and CP.  Pt on warfarin PTA for afib.  INR is subtherapeutic. The patient reports that the last dose PTA was on 09/16/21. Patient reports frequently changing the time of day that warfarin is given. Warfarin 15 mg was given inpatient at 0347 this morning. Due to subtherapeutic INR from missed dose and low time in therapeutic range outpatient and possible need for cath, cardiology has consulted pharmacy to hold warfarin and transition to heparin infusion. CTA read negative for PE. CBC stable, no bleeding noted.   Goal of Therapy:  INR 2-3 Monitor platelets by anticoagulation protocol: Yes   Plan:  Hold warfarin Omit bolus with recent warfarin use Start heparin at 900 units/hr Check 6 hour heparin level Daily heparin level and CBC  Thank you for allowing pharmacy to participate in this patient's care.  Reatha Harps, PharmD PGY1 Pharmacy Resident 09/18/2021 12:57 PM Check [email or web address] for unit specific pharmacy number

## 2021-09-18 NOTE — Progress Notes (Signed)
ANTICOAGULATION CONSULT NOTE - Initial Consult  Pharmacy Consult for warfarin Indication: atrial fibrillation  Allergies  Allergen Reactions   Shellfish Allergy Swelling    Airway involvement including EMS - Has Epi-Pen   Dulaglutide Nausea And Vomiting and Other (See Comments)    Multiple episodes of vomiting over multiple days   Metformin And Related Diarrhea   Ibuprofen Other (See Comments)    Per PCP interferes with daily meds (heart failure)    Patient Measurements: Height: 4\' 11"  (149.9 cm) Weight: 84.7 kg (186 lb 11.2 oz) IBW/kg (Calculated) : 43.2  Vital Signs: Temp: 97.7 F (36.5 C) (02/25 0252) Temp Source: Oral (02/25 0252) BP: 98/62 (02/25 0735) Pulse Rate: 108 (02/24 2315)  Labs: Recent Labs    09/17/21 2115 09/17/21 2204 09/17/21 2335 09/18/21 0328 09/18/21 0528  HGB 11.8* 12.2  --   --  11.1*  HCT 37.0 36.0  --   --  34.4*  PLT 394  --   --   --  354  APTT 31  --   --   --   --   LABPROT 17.5*  --   --  18.5*  --   INR 1.4*  --   --  1.5*  --   CREATININE 0.91  --   --  0.86  --   TROPONINIHS 23*  --  17  --   --      Estimated Creatinine Clearance: 79.6 mL/min (by C-G formula based on SCr of 0.86 mg/dL).   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     Migraines    Morbid obesity ([place]) 02/06/2019   Obstructive sleep apnea 06/30/2015   Polycystic ovarian disease     Medications:  Warfarin 11.25mg  daily PTA  Assessment: 46 yo F admitted  with shortness of breath and CP.  Pt on warfarin PTA for afib.  INR is subtherapeutic. The patient reports that the last dose PTA was on 09/16/21. Patient reports frequently changing the time of day that warfarin is given. Warfarin 15 mg was given inpatient at 0347 this morning. Due to subtherapeutic INR from missed dose and low time in therapeutic range outpatient, will give a dose this evening in addition to the dose this AM to stay on nightly schedule.  PTA regimen: 11.25 mg daily  Goal of Therapy:  INR 2-3 Monitor platelets by anticoagulation protocol: Yes   Plan:  Warfarin 10 mg PO tonight Daily INR   Thank you for allowing pharmacy to participate in this patient's care.  Reatha Harps, PharmD PGY1 Pharmacy Resident 09/18/2021 10:15 AM Check [email or web address] for unit specific pharmacy number

## 2021-09-18 NOTE — Progress Notes (Signed)
PT Cancellation Note  Patient Details Name: [name] MRN: [id number] DOB: Jan 13, 1976   Cancelled Treatment:    Reason Eval/Treat Not Completed: Patient declined, no reason specified (pt reports fatigue and declined participation at this time)   Lamarr Lulas 09/18/2021, 11:08 AM Bayard Males, PT Acute Rehabilitation Services Pager: [phone] Office: [phone]

## 2021-09-18 NOTE — Progress Notes (Signed)
ANTICOAGULATION CONSULT NOTE  Pharmacy Consult to stop warfarin and start heparin Indication: atrial fibrillation  Allergies  Allergen Reactions   Shellfish Allergy Swelling    Airway involvement including EMS - Has Epi-Pen   Dulaglutide Nausea And Vomiting and Other (See Comments)    Multiple episodes of vomiting over multiple days   Metformin And Related Diarrhea   Ibuprofen Other (See Comments)    Per PCP interferes with daily meds (heart failure)    Patient Measurements: Height: 4\' 11"  (149.9 cm) Weight: 84.7 kg (186 lb 11.2 oz) IBW/kg (Calculated) : 43.2 Heparin dosing weight 62.7  Vital Signs: Temp: 97.6 F (36.4 C) (02/25 2031) Temp Source: Oral (02/25 2031) BP: 92/64 (02/25 2050) Pulse Rate: 86 (02/25 1629)  Labs: Recent Labs    09/17/21 2115 09/17/21 2204 09/17/21 2335 09/18/21 0328 09/18/21 0528 09/18/21 2011  HGB 11.8* 12.2  --   --  11.1*  --   HCT 37.0 36.0  --   --  34.4*  --   PLT 394  --   --   --  354  --   APTT 31  --   --   --   --   --   LABPROT 17.5*  --   --  18.5*  --   --   INR 1.4*  --   --  1.5*  --   --   HEPARINUNFRC  --   --   --   --   --  <0.10*  CREATININE 0.91  --   --  0.86  --   --   TROPONINIHS 23*  --  17  --   --   --      Estimated Creatinine Clearance: 79.6 mL/min (by C-G formula based on SCr of 0.86 mg/dL).  Assessment: 46 yo F admitted with shortness of breath and CP.  Pt on warfarin PTA for afib.  INR is subtherapeutic. The patient reports that the last dose PTA was on 09/16/21. Patient reports frequently changing the time of day that warfarin is given. Warfarin 15 mg was given inpatient at 0347 this morning. Due to subtherapeutic INR from missed dose and low time in therapeutic range outpatient and possible need for cath, cardiology has consulted pharmacy to hold warfarin and transition to heparin infusion. CTA read negative for PE. CBC stable, no bleeding noted.  Heparin level undetectable on infusion at 900 units/hr.  No issues with line or bleeding reported per RN.  Goal of Therapy:  INR 2-3; Heparin level 0.3-0.7 units/ml Monitor platelets by anticoagulation protocol: Yes   Plan:  Increase heparin to 1150 units/hr Check 6 hour heparin level  Sherlon Handing, PharmD, BCPS Please see amion for complete clinical pharmacist phone list 09/18/2021 9:25 PM

## 2021-09-18 NOTE — Progress Notes (Signed)
°  Echocardiogram 2D Echocardiogram has been performed.  [name] 09/18/2021, 9:03 AM

## 2021-09-18 NOTE — Progress Notes (Signed)
CSW acknowledges consult for SNF/HH. The patient will require PT/OT evaluations. TOC will assist with disposition planning once the evaluations have been completed.  °  °TOC will continue to follow.    °

## 2021-09-18 NOTE — Evaluation (Signed)
Occupational Therapy Evaluation Patient Details Name: [name] MRN: [id number] DOB: April 21, 1976 Today's Date: 09/18/2021   History of Present Illness [name] is a 46 y.o. female who presented after 4 days of worsening breathing and dyspnea wtih exertion. 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 She lives alone in a 2 level home with her bed upstairs. Upon evaluation pt reported being generally fatigued and tired, with some SOB and mild chest pain with exertion. Overall she required min guard for pivotal/short ambulation form bed>BSC and declined further mobility. Pt is limited by poor activity tolerance, SOB and increased WOB with functional tasks. She will benefit from OT acutely to address the limitations listed below. Recommend d/c to home without OT follow up.      Recommendations for follow up therapy are one component of a multi-disciplinary discharge planning process, led by the attending physician.  Recommendations may be updated based on patient status, additional functional criteria and insurance authorization.   Follow Up Recommendations  No OT follow up    Assistance Recommended at Discharge Intermittent Supervision/Assistance  Patient can return home with the following Help with stairs or ramp for entrance;Assist for transportation    Functional Status Assessment  Patient has had a recent decline in their functional status and demonstrates the  ability to make significant improvements in function in a reasonable and predictable amount of time.  Equipment Recommendations  BSC/3in1    Recommendations for Other Services       Precautions / Restrictions Precautions Precautions: Fall Restrictions Weight Bearing Restrictions: No      Mobility Bed Mobility Overal bed mobility: Needs Assistance Bed Mobility: Supine to Sit, Sit to Supine     Supine to sit: Supervision Sit to supine: Supervision   General bed mobility comments: incr time, HOB elevated    Transfers Overall transfer level: Needs assistance Equipment used:  (BSC) Transfers: Sit to/from Stand Sit to Stand: Min guard                  Balance Overall balance assessment: Needs assistance Sitting-balance support: Feet supported Sitting balance-Leahy Scale: Fair     Standing balance support: No upper extremity supported, During functional activity Standing balance-Leahy Scale: Fair                             ADL either performed or assessed with clinical judgement   ADL Overall ADL's : Needs assistance/impaired Eating/Feeding: Independent;Sitting   Grooming: Min guard;Standing   Upper Body Bathing: Set up;Sitting Upper Body Bathing Details (indicate cue type and reason): incr time Lower Body Bathing: Minimal assistance;Sit to/from stand   Upper Body Dressing : Set up;Sitting   Lower Body Dressing: Minimal assistance;Sit to/from stand   Toilet Transfer: Min guard;Ambulation;BSC/3in1;Rolling walker (2 wheels) Toilet Transfer Details (indicate cue type and reason): short, small steps to [facility]. pt reports she has been gettign to [facility] indep while in acute care Toileting- Clothing Manipulation and Hygiene: Supervision/safety;Sitting/lateral lean  Functional mobility during ADLs: Min guard;Rolling walker (2 wheels)       Vision Baseline Vision/History: 0 No visual deficits Ability to See in Adequate Light: 0 Adequate Patient  Visual Report: No change from baseline Vision Assessment?: No apparent visual deficits     Perception     Praxis      Pertinent Vitals/Pain Pain Assessment Pain Assessment: Faces Faces Pain Scale: Hurts a little bit Pain Location: generalized and chest pain Pain Descriptors / Indicators: Discomfort Pain Intervention(s): Limited activity within patient's tolerance, Monitored during session     Hand Dominance     Extremity/Trunk Assessment Upper Extremity Assessment Upper Extremity Assessment: Generalized weakness;Overall [facility] for tasks assessed       Cervical / Trunk Assessment Cervical / Trunk Assessment: Normal   Communication Communication Communication: No difficulties   Cognition Arousal/Alertness: Awake/alert Behavior During Therapy: WFL for tasks assessed/performed Overall Cognitive Status: Within Functional Limits for tasks assessed                                 General Comments: flat affect, reports being fatigued adn tired from lack of sleep     General Comments  VSS on 2L    Exercises     Shoulder Instructions      Home Living Family/patient expects to be discharged to:: Private residence Living Arrangements: Alone   Type of Home: [place]: Two level;1/2 bath on main level Alternate Level Stairs-Number of Steps: flight   Bathroom Shower/Tub: Teacher, early years/pre: Standard     Home Equipment: None          Prior Functioning/Environment Prior Level of Function : Independent/Modified Independent;Working/employed;Driving             Mobility Comments: no AD ADLs Comments: indep, works as a Tax adviser Problem List: Decreased strength;Decreased range of motion;Decreased activity tolerance;Impaired balance (sitting and/or standing);Decreased safety awareness;Decreased knowledge of use of DME or AE;Decreased knowledge of precautions      OT Treatment/Interventions:  Self-care/ADL training;Therapeutic exercise;Balance training;Patient/family education;DME and/or AE instruction    OT Goals(Current goals can be found in the care plan section) Acute Rehab OT Goals Patient Stated Goal: back home OT Goal Formulation: With patient Time For Goal Achievement: 10/02/21 Potential to Achieve Goals: Good ADL Goals Pt Will Perform Grooming: standing;Independently Pt Will Perform Lower Body Dressing: Independently;sit to/from stand Pt Will Transfer to Toilet: Independently;ambulating Additional ADL Goal #1: Pt will indep recall at least 3 energy conservation techniques to prepare for safe d/c home  OT Frequency: Min 2X/week    Co-evaluation              AM-PAC OT "6 Clicks" Daily Activity     Outcome Measure Help from another person eating meals?: None Help from another person taking care of personal grooming?: A Little Help from another person toileting, which includes using toliet, bedpan, or urinal?: A Little Help from another person bathing (including washing, rinsing, drying)?: A Little Help from another person to put on and taking off regular upper body clothing?: None Help from another person to put on and taking off regular lower body clothing?: A Little 6 Click Score: 20   End of Session Equipment Utilized During Treatment: Oxygen Nurse Communication: Mobility status  Activity Tolerance: Patient tolerated treatment well Patient left: in bed;with call bell/phone within  reach;with bed alarm set  OT Visit Diagnosis: Other abnormalities of gait and mobility (R26.89);Unsteadiness on feet (R26.81);Muscle weakness (generalized) (M62.81)                Time: 3818-4037 OT Time Calculation (min): 19 min Charges:  OT General Charges $OT Visit: 1 Visit OT Evaluation $OT Eval Moderate Complexity: 1 Mod   Loreley Schwall A Sheelah Ritacco 09/18/2021, 3:43 PM

## 2021-09-18 NOTE — Progress Notes (Signed)
ANTICOAGULATION CONSULT NOTE - Initial Consult  Pharmacy Consult for warfarin Indication: atrial fibrillation  Allergies  Allergen Reactions   Shellfish Allergy Swelling    Airway involvement including EMS - Has Epi-Pen   Dulaglutide Nausea And Vomiting and Other (See Comments)    Multiple episodes of vomiting over multiple days   Metformin And Related Diarrhea   Ibuprofen Other (See Comments)    Per PCP interferes with daily meds (heart failure)    Patient Measurements: Height: 4\' 11"  (149.9 cm) Weight: 83 kg (183 lb) IBW/kg (Calculated) : 43.2  Vital Signs: Temp: 98.5 F (36.9 C) (02/24 2038) Temp Source: Oral (02/24 2038) BP: 129/109 (02/24 2315) Pulse Rate: 108 (02/24 2315)  Labs: Recent Labs    09/17/21 2115 09/17/21 2204 09/17/21 2335  HGB 11.8* 12.2  --   HCT 37.0 36.0  --   PLT 394  --   --   APTT 31  --   --   LABPROT 17.5*  --   --   INR 1.4*  --   --   CREATININE 0.91  --   --   TROPONINIHS 23*  --  17    Estimated Creatinine Clearance: 74.4 mL/min (by C-G formula based on SCr of 0.91 mg/dL).   Medical History: Past Medical History:  Diagnosis Date   Abnormal Pap smear    Acute on chronic combined systolic and diastolic CHF (congestive heart failure) ([place]) 02/19/2019   Acute on chronic congestive heart failure ([facility])    Anemia    Asthma    Atrial fibrillation ([place])    Diabetes mellitus    DKA, type 2 ([place]) 02/06/2019   Heart failure with reduced ejection fraction ([place])    Hypertension    Major depressive disorder 02/08/2012   Reports that 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     Migraines    Morbid obesity ([place]) 02/06/2019   Obstructive sleep apnea 06/30/2015   Polycystic ovarian disease     Medications:  Warfarin 11.25mg  daily PTA  Assessment: 46 yo F admitted with shortness of breath and CP.  Pt on warfarin PTA for afib.  INR is subtherapeutic.  Goal of Therapy:  INR  2-3 Monitor platelets by anticoagulation protocol: Yes   Plan:  Warfarin 15mg  PO x 1 now Daily INR   [employer], Pharm.D., BCPS Clinical Pharmacist  **Pharmacist phone directory can be found on [email or web address] listed under [place].  09/18/2021 12:35 AM

## 2021-09-18 NOTE — Consult Note (Signed)
Cardiology Consultation:   Patient ID: [name] MRN: [id number]; DOB: 1975/08/24  Admit date: 09/17/2021 Date of Consult: 09/18/2021  PCP:  Gifford Shave, MD   [facility] Providers Cardiologist:  Loralie Champagne, MD  Electrophysiologist:  Constance Haw, MD    Patient Profile:   [name] is a 46 y.o. 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 McDiarmid.  History of Present Illness:   [name] is a 46 year old female with the above history who is followed by Dr. Aundra Dubin. Patient was diagnosed with CHF in 12/2020 when outpatient Echo for further evaluation of dyspnea on exertion showed LVEF of 20-25% with moderate RV dysfunction. R/LHC in 03/2019 showed non-obstructive CAD with right > left heart failure with low PAPI suggestive significant RV dysfunction and low cardiac output with CI of 2.07. GDMT was optimized. She follows with Dr. Aundra Dubin in the [facility]. Last Echo in 05/2020 showed LVEF of 35-40% with global hypokinesis and grade 1 diastolic dysfunction. RV normal. No significant valvular disease. Patient was last seen by Dr. Aundra Dubin on 08/27/2021 at which time she continued to struggle with psychosocial issues but was doing relatively well from a cardiac standpoint. She has had compliance issues in the past but was doing better with taking her medications. Bidil was stopped due to migraines and she was started on Hydralazine instead. Current medication regiment includes Torsemide 154m daily, Entresto 97-1060mtwice daily, Coreg 2576mwice daily,  Spironolactone 19m27mily, Ivabradine 5mg 47mce daily, Digoxin 0.119mg 50my, Hydralazine 50mg t5m times daily, and Jardiance 10mg da61m  Patient presented to the ED on 09/17/2021 via EMS for further evaluation of shortness of breath and chest pain.  Patient some chronic dyspnea on exertion at baseline but reports significant worsening of breathing over the past 4 days.  She reports shortness of breath both at rest and with exertion and describes significant orthopnea as well as PND.  She states she has been having to sleep propped up on her knees the last couple of days due to shortness of breath.  She reports compliance with all of her CHF medications and denies any weight gain.  She does report mild swelling in her feet.  She also was reports chest/back pressure/throbbing for the past 2 to 3 days.  I tried to assess whether she typically gets chest pain when she is volume overloaded but she was unable to answer this.  She does state she has had intermittent chest pain in the past but was unable to elaborate on this.  She notes some palpitations when moving around and reports some dizziness at at this time but no syncope.  She denies any recent fevers or illnesses.  No cough or nasal congestion.  She does state that she vomited twice in the ED after receiving some medication but no other recent GI symptoms.  No abnormal bleeding in urine or stools.  In the ED, patient hypertensive and tachycardic. EKG showed sinus tachycardia rate 115 bpm, with with no acute ischemic changes compared to prior tracing.  High-sensitivity troponin 23 >> 17. BNP elevated at 304.  Chest x-ray showed cardiomegaly with vascular congestion and diffuse bilateral interstitial and airspace opacities concerning for edema. WBC 14.5, Hgb 11.8, Plts 394. Na 133, K 3.9, Glucose 291, BUN 10, Cr 0.91. Albumin 2.8 but otherwise LFTs normal. Digoxin level <0.2. Respiratory panel negative for COVID and influenza.  She was started on IV Lasix and  admitted for further evaluation.   At the time of this evaluation, patient does not appear to be in any acute distress but states he is having shortness of breath and it is uncomfortable to the laying in the bed due to the shortness of breath.  During my exam, she actually propped herself up on her knees at the back of the bed to help with her breathing.  She also continues to report 8/10 chest pain as well as some dizziness.  Past Medical History:  Diagnosis Date   Abnormal Pap smear    Acute on chronic combined systolic and diastolic CHF (congestive heart failure) ([place]) 02/19/2019   Acute on chronic congestive heart failure ([facility])    Anemia    Asthma    Atrial fibrillation ([place])    Diabetes mellitus    DKA, type 2 ([place]) 02/06/2019   Heart failure with reduced ejection fraction ([place])    Hypertension    Major depressive disorder 02/08/2012   Reports that 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     Migraines    Morbid obesity ([place]) 02/06/2019   Obstructive sleep apnea 06/30/2015   Polycystic ovarian disease     Past Surgical History:  Procedure Laterality Date   CERVIX LESION DESTRUCTION     CESAREAN SECTION     RIGHT/LEFT HEART CATH AND CORONARY ANGIOGRAPHY N/A 03/29/2019   Procedure: RIGHT/LEFT HEART CATH AND CORONARY ANGIOGRAPHY;  Surgeon: Larey Dresser, MD;  Location: [facility];  Service: Cardiovascular;  Laterality: N/A;     Home Medications:  Prior to Admission medications   Medication Sig Start Date End Date Taking? Authorizing Provider  albuterol (VENTOLIN HFA) 108 (90 Base) MCG/ACT inhaler Inhale 2 puffs into the lungs every 6 (six) hours as needed for wheezing or shortness of breath. 09/16/21 09/16/22 Yes Lilland, Alana, DO  atorvastatin (LIPITOR) 40 MG tablet TAKE 1 TABLET BY MOUTH DAILY AT 6 PM. 04/01/20 08/27/22 Yes Larey Dresser, MD  blood glucose meter kit and supplies KIT Dispense based on patient and  insurance preference. Use up to four times daily as directed. (FOR ICD-9 250.00, 250.01). 09/05/20  Yes Rai, Ripudeep K, MD  Blood Glucose Monitoring Suppl (ACCU-CHEK AVIVA PLUS) w/Device KIT 1 application by Does not apply route QID. Check fasting sugars as well as throughout the day 02/12/19  Yes Abraham, Sherin, DO  busPIRone (BUSPAR) 7.5 MG tablet Take 7.5 mg by mouth 2 (two) times daily. 06/03/20  Yes [provider]  carvedilol (COREG) 25 MG tablet TAKE 1 TABLET (25 MG TOTAL) BY MOUTH 2 (TWO) TIMES DAILY WITH A MEAL. 01/23/20 08/27/22 Yes Larey Dresser, MD  Continuous Blood Gluc Receiver (FREESTYLE LIBRE 2 READER) DEVI Use as directed 09/02/21  Yes Hensel, Jamal Collin, MD  Continuous Blood Gluc Sensor (FREESTYLE LIBRE 2 SENSOR) MISC Use as directed - replace every 14 days 09/02/21  Yes Hensel, Jamal Collin, MD  digoxin (LANOXIN) 0.125 MG tablet Take 1 tablet (0.125 mg total) by mouth daily. 11/15/19  Yes Simmons, Brittainy M, PA-C  empagliflozin (JARDIANCE) 25 MG TABS tablet Take  25 mg by mouth daily. 10/03/19  Yes Pokhrel, Laxman, MD  gabapentin (NEURONTIN) 100 MG capsule Take 100 mg by mouth 2 (two) times daily.   Yes [provider]  glucose blood (ACCU-CHEK AVIVA PLUS) test strip Check CBG 2 times per day 08/16/19  Yes [name], DO  glucose blood (AGAMATRIX PRESTO TEST) test strip Use as instructed 10/23/17  Yes Hensel, Jamal Collin, MD  hydrALAZINE (APRESOLINE) 50 MG tablet Take 1 tablet (50 mg total) by mouth 3 (three) times daily. 08/27/21  Yes Milford, Maricela Bo, FNP  hydrOXYzine (ATARAX) 10 MG tablet Take 1 tablet (10 mg total) by mouth 3 (three) times daily as needed. 08/10/21  Yes Gifford Shave, MD  insulin aspart (NOVOLOG FLEXPEN) 100 UNIT/ML FlexPen Inject 12 Units into the skin daily before supper. 09/02/21  Yes Hensel, Jamal Collin, MD  insulin glargine (LANTUS) 100 UNIT/ML Solostar Pen Inject 36 Units into the skin every morning. 09/02/21  Yes Hensel, Jamal Collin, MD  Insulin Pen Needle  (UNIFINE PENTIPS) 31G X 5 MM MISC USE AS DIRECTED 2 TIMES DAILY 09/02/21  Yes Hensel, Jamal Collin, MD  Insulin Syringe-Needle U-100 (INSULIN SYRINGE .3CC/31GX5/16") 31G X 5/16" 0.3 ML MISC 15 Units by Does not apply route 2 (two) times daily. 09/05/20  Yes Rai, Ripudeep K, MD  Lancets Misc. (ACCU-CHEK SOFTCLIX LANCET DEV) KIT 1 application by Does not apply route 2 (two) times daily. 08/16/19  Yes [name], DO  meclizine (ANTIVERT) 12.5 MG tablet Take 1 tablet (12.5 mg total) by mouth 3 (three) times daily as needed for dizziness. 09/19/19  Yes Bland, Scott, DO  potassium chloride SA (KLOR-CON) 20 MEQ tablet Take 20 mEq by mouth See admin instructions. Take 1 tablet by mouth every Monday Wednesday and Friday   Yes [provider]  sacubitril-valsartan (ENTRESTO) 97-103 MG TAKE 1 TABLET BY MOUTH 2 (TWO) TIMES DAILY. 10/15/20 10/15/21 Yes Larey Dresser, MD  sertraline (ZOLOFT) 25 MG tablet Take 1 tablet (25 mg total) by mouth daily. 08/31/21  Yes Gifford Shave, MD  spironolactone (ALDACTONE) 25 MG tablet Take 25 mg by mouth daily.   Yes [provider]  torsemide (DEMADEX) 20 MG tablet Take 5 tablets (100 mg total) by mouth daily. 09/06/20  Yes Rai, Ripudeep K, MD  traZODone (DESYREL) 50 MG tablet Take 1 tablet (50 mg total) by mouth at bedtime as needed for sleep. 08/10/21  Yes Gifford Shave, MD  warfarin (COUMADIN) 7.5 MG tablet Take 1.5 tablets (11.41m) by mouth daily or as directed by Anticoagulation Clinic. 08/27/21  Yes MLarey Dresser MD  Accu-Chek Softclix Lancets lancets Use as instructed 02/12/19   ACaroline More DO    Inpatient Medications: Scheduled Meds:  atorvastatin  40 mg Oral Daily   carvedilol  25 mg Oral BID WC   digoxin  0.125 mg Oral Daily   gabapentin  100 mg Oral BID   insulin aspart  0-15 Units Subcutaneous TID WC   insulin glargine-yfgn  18 Units Subcutaneous Daily   sertraline  25 mg Oral Daily   Warfarin - Pharmacist Dosing Inpatient   Does not  apply q1600   Continuous Infusions:  furosemide     PRN Meds: acetaminophen, albuterol, hydrOXYzine  Allergies:    Allergies  Allergen Reactions   Shellfish Allergy Swelling    Airway involvement including EMS - Has Epi-Pen   Dulaglutide Nausea And Vomiting and Other (See Comments)    Multiple episodes of vomiting over multiple days   Metformin And  Related Diarrhea   Ibuprofen Other (See Comments)    Per PCP interferes with daily meds (heart failure)    Social History:   Social History   Tobacco Use   Smoking status: Never   Smokeless tobacco: Never  Substance Use Topics   Alcohol use: No    Alcohol/week: 0.0 standard drinks     Family History:   Family History  Problem Relation Age of Onset   Diabetes Mother    Hypertension Mother    Hypertension Father    Diabetes Father      ROS:  Please see the history of present illness.  No palpitations or syncope. All other ROS reviewed and negative.     Physical Exam/Data:   Vitals:   09/18/21 0400 09/18/21 0607 09/18/21 0735 09/18/21 0741  BP: (!) 152/114 100/71 98/62   Pulse:    88  Resp:  18  18  Temp:    97.9 F (36.6 C)  TempSrc:    Oral  SpO2:  100%  100%  Weight:      Height:        Intake/Output Summary (Last 24 hours) at 09/18/2021 0904 Last data filed at 09/18/2021 0413 Gross per 24 hour  Intake 240 ml  Output 500 ml  Net -260 ml   Last 3 Weights 09/18/2021 09/17/2021 09/02/2021  Weight (lbs) 186 lb 11.2 oz 183 lb 186 lb  Weight (kg) 84.687 kg 83.008 kg 84.369 kg     Body mass index is 37.71 kg/m.  General: Patient reports feeling of tightness and shortness of breath.  No distress. HEENT: Conjunctiva and lids normal, oropharynx clear with moist mucosa. Neck: Supple, difficult to assess JVP, no carotid bruits, no thyromegaly. Lungs: Decreased breath sounds at the bases, pleuritic discomfort with inspiration.. Cardiac: Indistinct PMI, regular rate and rhythm, no S3 or significant systolic murmur,  no pericardial rub. Abdomen: Soft, nontender, bowel sounds present. Extremities: Mild leg edema, distal pulses 2+. Skin: Warm and dry. Musculoskeletal: No kyphosis. Neuropsychiatric: Alert and oriented x3, affect grossly appropriate.   EKG:  The EKG was personally reviewed and demonstrates: Sinus tachycardia, rate 115 bpm, with LVH and abnormal T waves in leads V5-V6 but no significant changes from prior EKG.  Left axis deviation.  Normal PR and QRS intervals.  QTc 460 ms.  Telemetry:  Telemetry was personally reviewed and demonstrates: Sinus rhythm.  Relevant CV Studies:  Right/Left Cardiac Catheterization 03/29/2019: 1. No obstructive CAD, nonischemic cardiomyopathy.  2. R > L heart failure with low PAPI suggesting significant RV dysfunction.  3. Primarily pulmonary venous hypertension.  4. Low cardiac output, CI 2.07.    Patient will need ongoing diuresis, will start Digoxin and Lasix gtt.  She will need to start warfarin with heparin bridge.  _______________  Echocardiogram 05/26/2020: Impressions:  1. Left ventricular ejection fraction, by estimation, is 35 to 40%. The  left ventricle has moderately decreased function. The left ventricle  demonstrates global hypokinesis. There is mild left ventricular  hypertrophy. Left ventricular diastolic  parameters are consistent with Grade I diastolic dysfunction (impaired  relaxation).   2. Right ventricular systolic function is normal. The right ventricular  size is normal. Tricuspid regurgitation signal is inadequate for assessing  PA pressure.   3. The mitral valve is normal in structure. No evidence of mitral valve  regurgitation. No evidence of mitral stenosis.   4. The aortic valve is tricuspid. Aortic valve regurgitation is not  visualized. No aortic stenosis is present.   5.  The inferior vena cava is normal in size with greater than 50%  respiratory variability, suggesting right atrial pressure of 3 mmHg.    Laboratory  Data:  High Sensitivity Troponin:   Recent Labs  Lab 09/17/21 2115 09/17/21 2335  TROPONINIHS 23* 17     Chemistry Recent Labs  Lab 09/17/21 2115 09/17/21 2204 09/18/21 0328  NA 133* 136 133*  K 3.9 4.1 4.0  CL 104  --  101  CO2 20*  --  20*  GLUCOSE 291*  --  308*  BUN 10  --  9  CREATININE 0.91  --  0.86  CALCIUM 8.8*  --  8.8*  GFRNONAA >60  --  >60  ANIONGAP 9  --  12    Recent Labs  Lab 09/17/21 2115  PROT 7.1  ALBUMIN 2.8*  AST 23  ALT 15  ALKPHOS 99  BILITOT 0.6   Lipids  Recent Labs  Lab 09/18/21 0328  CHOL 222*  TRIG 97  HDL 43  LDLCALC 160*  CHOLHDL 5.2    Hematology Recent Labs  Lab 09/17/21 2115 09/17/21 2204 09/18/21 0528  WBC 14.5*  --  13.6*  RBC 4.86  --  4.60  HGB 11.8* 12.2 11.1*  HCT 37.0 36.0 34.4*  MCV 76.1*  --  74.8*  MCH 24.3*  --  24.1*  MCHC 31.9  --  32.3  RDW 16.5*  --  16.3*  PLT 394  --  354    BNP Recent Labs  Lab 09/17/21 2115  BNP 304.0*      Radiology/Studies:  CT Angio Chest PE W and/or Wo Contrast  Result Date: 09/17/2021 CLINICAL DATA:  Shortness of breath EXAM: CT ANGIOGRAPHY CHEST WITH CONTRAST TECHNIQUE: Multidetector CT imaging of the chest was performed using the standard protocol during bolus administration of intravenous contrast. Multiplanar CT image reconstructions and MIPs were obtained to evaluate the vascular anatomy. RADIATION DOSE REDUCTION: This exam was performed according to the departmental dose-optimization program which includes automated exposure control, adjustment of the mA and/or kV according to patient size and/or use of iterative reconstruction technique. CONTRAST:  107m OMNIPAQUE IOHEXOL 350 MG/ML SOLN COMPARISON:  Chest x-ray 09/17/2021, CT chest 07/28/2020 FINDINGS: Cardiovascular: Satisfactory opacification of the pulmonary arteries to the segmental level. No evidence of pulmonary embolism. Cardiomegaly. No pericardial effusion. Nonaneurysmal aorta Mediastinum/Nodes: No  enlarged mediastinal, hilar, or axillary lymph nodes. Thyroid gland, trachea, and esophagus demonstrate no significant findings. Lungs/Pleura: Small bilateral effusions. Patchy perihilar ground-glass density. Central airways thickening. Upper Abdomen: No acute abnormality. Musculoskeletal: No chest wall abnormality. No acute or significant osseous findings. Review of the MIP images confirms the above findings. IMPRESSION: 1. Negative for acute pulmonary embolus. 2. Cardiomegaly with small bilateral effusions and hazy perihilar ground-glass density probably representing edema. 3. Central airways thickening suggestive of airways inflammatory process Electronically Signed   By: KDonavan FoilM.D.   On: 09/17/2021 23:12   DG Chest Portable 1 View  Result Date: 09/17/2021 CLINICAL DATA:  Shortness of breath EXAM: PORTABLE CHEST 1 VIEW COMPARISON:  08/12/2020 FINDINGS: Cardiomegaly, vascular congestion and diffuse bilateral interstitial and airspace opacities concerning for edema. No effusions. No acute bony abnormality. IMPRESSION: Cardiomegaly, mild CHF. Electronically Signed   By: KRolm BaptiseM.D.   On: 09/17/2021 21:16     Assessment and Plan:   1.  HFrEF with acute systolic heart failure presenting with increasing shortness of breath and chest tightness, approximately 3 pound weight gain, leg swelling.  She has a  history of nonischemic cardiomyopathy diagnosed in 2020.  She reports compliance with all of her medications, symptom onset about 4 days ago without other obvious precipitant.  Follow-up echocardiogram this admission shows LVEF down to 20%, had previously been 35 to 40% by evaluation in 2021.  Also severe RV dysfunction with moderately elevated RVSP of 46 mmHg.  2.  Paroxysmal atrial fibrillation, on Coumadin as an outpatient (prior history of LV thrombus so not on DOAC).  INR subtherapeutic at 1.5.  3.  Essential hypertension.  4.  Poorly controlled type 2 diabetes mellitus on insulin and  Jardiance.  Last hemoglobin A1c 13.2%.  5.  Chest CTA negative for pulmonary embolus but showing central airway thickening suggesting possible inflammatory process.  Also small bilateral pleural effusions.  I notified the heart failure team about patient's presence.  Decrease Coreg to 12.5 mg twice daily for now, continue Lanoxin, Aldactone and Jardiance.  Convert to IV Lasix.  Holding off on ivabradine, Entresto, and hydralazine for now with recent blood pressures.  Precipitant for acute heart failure exacerbation not entirely clear, still questioning compliance but may be multifactorial.  Given drop in LVEF and her chest pain, may want want to consider a follow-up right and left heart catheterization.  Will convert from Coumadin to heparin, she is already subtherapeutic.  Satira Sark, M.D., F.A.C.C.

## 2021-09-19 DIAGNOSIS — R079 Chest pain, unspecified: Secondary | ICD-10-CM

## 2021-09-19 DIAGNOSIS — Z794 Long term (current) use of insulin: Secondary | ICD-10-CM | POA: Diagnosis not present

## 2021-09-19 DIAGNOSIS — I5023 Acute on chronic systolic (congestive) heart failure: Secondary | ICD-10-CM | POA: Diagnosis not present

## 2021-09-19 DIAGNOSIS — E119 Type 2 diabetes mellitus without complications: Secondary | ICD-10-CM | POA: Diagnosis not present

## 2021-09-19 LAB — HEPARIN LEVEL (UNFRACTIONATED)
Heparin Unfractionated: 0.22 IU/mL — ABNORMAL LOW (ref 0.30–0.70)
Heparin Unfractionated: 0.6 IU/mL (ref 0.30–0.70)
Heparin Unfractionated: 0.92 IU/mL — ABNORMAL HIGH (ref 0.30–0.70)

## 2021-09-19 LAB — CBC
HCT: 35.1 % — ABNORMAL LOW (ref 36.0–46.0)
Hemoglobin: 11.4 g/dL — ABNORMAL LOW (ref 12.0–15.0)
MCH: 24.1 pg — ABNORMAL LOW (ref 26.0–34.0)
MCHC: 32.5 g/dL (ref 30.0–36.0)
MCV: 74.2 fL — ABNORMAL LOW (ref 80.0–100.0)
Platelets: 339 10*3/uL (ref 150–400)
RBC: 4.73 MIL/uL (ref 3.87–5.11)
RDW: 16.2 % — ABNORMAL HIGH (ref 11.5–15.5)
WBC: 11.2 10*3/uL — ABNORMAL HIGH (ref 4.0–10.5)
nRBC: 0 % (ref 0.0–0.2)

## 2021-09-19 LAB — PROTIME-INR
INR: 1.9 — ABNORMAL HIGH (ref 0.8–1.2)
Prothrombin Time: 21.9 seconds — ABNORMAL HIGH (ref 11.4–15.2)

## 2021-09-19 LAB — GLUCOSE, CAPILLARY
Glucose-Capillary: 98 mg/dL (ref 70–99)
Glucose-Capillary: 221 mg/dL — ABNORMAL HIGH (ref 70–99)
Glucose-Capillary: 148 mg/dL — ABNORMAL HIGH (ref 70–99)
Glucose-Capillary: 186 mg/dL — ABNORMAL HIGH (ref 70–99)

## 2021-09-19 LAB — DIGOXIN LEVEL: Digoxin Level: <0.2 ng/mL — ABNORMAL LOW (ref 0.8–2.0)

## 2021-09-19 LAB — BASIC METABOLIC PANEL
Anion gap: 11 (ref 5–15)
BUN: 17 mg/dL (ref 6–20)
CO2: 25 mmol/L (ref 22–32)
Calcium: 8.8 mg/dL — ABNORMAL LOW (ref 8.9–10.3)
Chloride: 100 mmol/L (ref 98–111)
Creatinine, Ser: 0.91 mg/dL (ref 0.44–1.00)
GFR, Estimated: >60 mL/min (ref >60)
Glucose, Bld: 128 mg/dL — ABNORMAL HIGH (ref 70–99)
Potassium: 3.7 mmol/L (ref 3.5–5.1)
Sodium: 136 mmol/L (ref 135–145)

## 2021-09-19 LAB — MAGNESIUM: Magnesium: 1.9 mg/dL (ref 1.7–2.4)

## 2021-09-19 MED ORDER — SODIUM CHLORIDE 0.9% FLUSH: 3.0000 mL | INTRAVENOUS | Status: DC | PRN

## 2021-09-19 MED ORDER — ASPIRIN 81 MG PO CHEW
81.0000 mg | CHEWABLE_TABLET | ORAL | Status: AC
  Administered 2021-09-20: 81 mg via ORAL
  Filled 2021-09-19: qty 1

## 2021-09-19 MED ORDER — SODIUM CHLORIDE 0.9 % IV SOLN: INTRAVENOUS | Status: DC

## 2021-09-19 MED ORDER — SODIUM CHLORIDE 0.9 % IV SOLN: 250.0000 mL | INTRAVENOUS | Status: DC | PRN

## 2021-09-19 MED ORDER — SODIUM CHLORIDE 0.9% FLUSH
3.0000 mL | Freq: Two times a day (BID) | INTRAVENOUS | Status: DC
  Administered 2021-09-19 – 2021-09-22 (×4): 3 mL via INTRAVENOUS

## 2021-09-19 NOTE — Evaluation (Signed)
Physical Therapy Evaluation Patient Details Name: [name] MRN: [id number] DOB: [birth year] Today's Date: 09/19/2021  History of Present Illness  [name] is a 46 y.o. female who presented after 4 days of worsening breathing and dyspnea wtih exertion. 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 Pt presents with decreased cardiopulmonary endurance. Received on bedside commode; able to complete peri care and grooming/hygiene tasks at sink modI. Deferring hallway ambulation due to dizziness. BP 91/63 (73), HR 79, SpO2 98% on RA. Suspect steady progress. Will continue to follow acutely to promote mobility as tolerated.     Recommendations for follow up therapy are one component of a multi-disciplinary discharge planning process, led by the attending physician.  Recommendations may be updated based on patient status, additional functional criteria and insurance authorization.  Follow Up Recommendations No PT follow up    Assistance Recommended at Discharge PRN  Patient can return home with the following  Assistance with cooking/housework    Equipment Recommendations None recommended by PT  Recommendations for Other Services       Functional Status Assessment Patient has had a recent decline in their functional status and demonstrates the ability to make significant improvements in function in a reasonable and predictable amount of time.     Precautions / Restrictions Precautions Precautions: Fall Restrictions Weight Bearing Restrictions: No       Mobility  Bed Mobility               General bed mobility comments: Sitting on BSC upon entry    Transfers Overall transfer level: Needs assistance Equipment used: None Transfers: Sit to/from Stand Sit to Stand: Supervision                Ambulation/Gait Ambulation/Gait assistance: Supervision Gait Distance (Feet): 5 Feet Assistive device: None Gait Pattern/deviations: WFL(Within Functional Limits) Gait velocity: decreased for age     General Gait Details: Pt ambulating from [facility] to sink then back to bed with no physical assist required, overall steady, assist for line management  Stairs            Wheelchair Mobility    Modified Rankin (Stroke Patients Only)       Balance Overall balance assessment: Needs assistance Sitting-balance support: Feet supported Sitting balance-Leahy Scale: Good     Standing balance support: No upper extremity supported, During functional activity Standing balance-Leahy Scale: Good                               Pertinent Vitals/Pain Pain Assessment Pain Assessment: 0-10 Pain Score: 8  Pain Location: chest Pain Descriptors / Indicators: Discomfort Pain Intervention(s): Monitored during session, Patient requesting pain meds-RN notified    Home Living Family/patient expects to be discharged to:: Private residence Living Arrangements: Alone   Type of Home: House       Alternate Level Stairs-Number of Steps: flight Home Layout: Two level;1/2 bath on main level Home Equipment: None      Prior Function Prior Level of Function : Independent/Modified Independent;Working/employed;Driving  Mobility Comments: no AD ADLs Comments: indep, works as a Counselling psychologist        Extremity/Trunk Assessment   Upper Extremity Assessment Upper Extremity Assessment: Defer to OT evaluation    Lower Extremity Assessment Lower Extremity Assessment: Overall WFL for  tasks assessed    Cervical / Trunk Assessment Cervical / Trunk Assessment: Normal  Communication   Communication: No difficulties  Cognition Arousal/Alertness: Awake/alert Behavior During Therapy: WFL for tasks assessed/performed Overall Cognitive Status: Within Functional Limits for tasks assessed                                 General Comments: flat affect        General Comments      Exercises     Assessment/Plan    PT Assessment Patient needs continued PT services  PT Problem List Decreased strength;Decreased activity tolerance;Decreased balance;Decreased mobility;Cardiopulmonary status limiting activity;Pain       PT Treatment Interventions Gait training;Stair training;Functional mobility training;Therapeutic activities;Therapeutic exercise;Balance training;Patient/family education    PT Goals (Current goals can be found in the Care Plan section)  Acute Rehab PT Goals Patient Stated Goal: improved strength/energy PT Goal Formulation: With patient Time For Goal Achievement: 10/03/21 Potential to Achieve Goals: Good    Frequency Min 3X/week     Co-evaluation               AM-PAC PT "6 Clicks" Mobility  Outcome Measure Help needed turning from your back to your side while in a flat bed without using bedrails?: None Help needed moving from lying on your back to sitting on the side of a flat bed without using bedrails?: None Help needed moving to and from a bed to a chair (including a wheelchair)?: A Little Help needed standing up from a chair using your arms (e.g., wheelchair or bedside chair)?: A Little Help needed to walk in hospital room?: A Little Help needed climbing 3-5 steps with a railing? : A Lot 6 Click Score: 19    End of Session   Activity Tolerance: Patient limited by fatigue Patient left: in bed;with call bell/phone within reach Nurse Communication: Mobility status PT Visit Diagnosis: Difficulty in walking, not elsewhere  classified (R26.2)    Time: 5093-2671 PT Time Calculation (min) (ACUTE ONLY): 27 min   Charges:   PT Evaluation $PT Eval Low Complexity: 1 Low PT Treatments $Therapeutic Activity: 8-22 mins        Wyona Almas, PT, DPT Acute Rehabilitation Services Pager [phone] Office [phone]   Deno Etienne 09/19/2021, 3:05 PM

## 2021-09-19 NOTE — Hospital Course (Addendum)
[name] is a 46 y.o. female presenting with increased work of breathing. PMH is significant for T2DM, HFrEF, PAF, PCOS, HTN, OSA, HLD, anxiety, obesity, and asthma. Hospital course outlined below:   Dyspnea and respiratory distress likely secondary to HFrEF exacerbation (EF 20%)   Nonischemic cardiomyopathy  Patient presented in respiratory distress, was able to maintain oxygen requirements but was on 2L oxygen for comfort. Patient was volume overloaded on exam during admission and required diuresis. She had a total urinary output of 6.9 L prior to discharge and was breathing comfortably on room air by time of discharge.  Patient's dry weight was 177.1 pounds (80.3 kg) the heart failure team and cardiology were consulted.  Patient had echocardiogram on 09/18/2021 showing EF of 20%, left ventricle with severely decreased function, grade 3 diastolic dysfunction. Cardiac cath performed as showed mild, non-obstructive CAD, elevated heart filling pressures and pulmonary venous hypertension. Cardiac MRI showed mildly dilated LV with EF of 23%, diffuse hypokinesis with inferior akinesis.  Patient was discharged on warfarin, Coreg 6.25 mg twice daily, digoxin 0.125 daily, Entresto 49-51 twice daily, Jardiance 25 mg daily, spironolactone 25 mg daily, torsemide 40 mg twice daily, potassium chloride 20 meq daily.  pAFib   H/o left ventricular thrombus  Patient was treated with Heparin gtt and received a cardiac cath procedure that showed mild non-obstructive CAD. Patient was discharged on digoxin and warfarin.  HTN  Intermittently, she experienced soft pressures, so blood pressure medications were held and restarted accordingly.  Patient was discharged on spironolactone and Coreg.  T2DM Continued with LAI 18U and Jardiance. Discharged on her home regimen.  Prandial blood sugars were high in the mid 200s, consider adding GLP-1 for better control.  Other chronic and stable conditions treated  accordingly.   Issues for follow-up: Ensure follow-up with cardiology Ensure follow-up with Coumadin clinic Consider adding GLP-1 for better blood glucose control

## 2021-09-19 NOTE — Progress Notes (Signed)
Family Medicine Teaching Service Daily Progress Note Intern Pager: [phone]  Patient name: [name] Medical record number: [id number] Date of birth: Nov 21, 1975 Age: 46 y.o. Gender: female  Primary Care Provider: Gifford Shave, MD Consultants: None 46 year old DNR Code Status: DNR  Pt Overview and Major Events to Date:    Assessment and Plan: [name] is a 46 y.o. female presenting with increased work of breathing. PMH is significant for T2DM, HFrEF, PAF, PCOS, HTN, OSA, HLD, anxiety, obesity, and asthma   Acute hypoxemic respiratory failure likely HFrEF exacerbation   nonischemic cardiomyopathy Echo yesterday: EF 20% s/p diuresis with Lasix.  Excellent output overnight 3000cc. Wt down 2kg since 09/19/21. S/p IV Lasix 40mg  BID and 120mg  Lasix per cards. No gross signs of fluid overload on exam today. 2L oxygen overnight for comfort. -Cardiology and heart failure team following appreciate recommendations -Continue Coreg 12.5 mg BID  -Continue digoxin 0.125mg   -Continue spironolactone 12.5mg   -Continue Jardiance 25mg   -IV Lasix 40mg  BID  -Monitor respiratory status -Continue medical prednisone 40 mg daily -Strict I's and O -Daily weights -Monitor urine output  A-fib paroxysmal   history of left ventricular thrombus INR today 1.9, continues to be subtherapeutic. INR goal 2-3. -Switched to heparin gtt per pharmacy due to plans for cardiac cath -Monitor INR  Chest pain/pressure  Ischemic work-up negative. Likely musculoskeletal in etiology -Continue to monitor -Tylenol 650 mg every 6 as needed -Oxycodone 2.5 mg every 6 as needed for pain  HTN BP overnight have been soft 92-115/64-73 -Hold home hydralazine  -Continue Coreg 25 mg -Continue spironolactone 25 mg daily   Type 2 diabetes CBGs overnight 185, 98 -Semglee 18 units -SSI  HLD -Continue atorvastatin 40mg  daily   Anxiety -Continue Zoloft 25mg  daily -Continue Ataraz 10mg  TID  FEN/GI: Regular  diet PPx: Heparin  Dispo:Home pending clinical improvement . Barriers include on going IV treatment and poor clinic status.   Subjective:  No acute concerns overnight per pt or RN.   Objective: Temp:  [97.6 F (36.4 C)-98.6 F (37 C)] 98.2 F (36.8 C) (02/26 0321) Pulse Rate:  [81-93] 81 (02/26 0321) Resp:  [11-20] 11 (02/26 0321) BP: (90-115)/(62-76) 115/73 (02/26 0321) SpO2:  [100 %] 100 % (02/26 0321) Weight:  [82.8 kg] 82.8 kg (02/26 0456)  Physical Exam:  General: Alert, no acute distress, 2LNC Cardio: Normal S1 and S2, RRR, no r/m/g Pulm: CTAB, normal work of breathing Extremities: No peripheral edema.  Neuro: Cranial nerves grossly intact    Laboratory: Recent Labs  Lab 09/17/21 2115 09/17/21 2204 09/18/21 0528 09/19/21 0311  WBC 14.5*  --  13.6* 11.2*  HGB 11.8* 12.2 11.1* 11.4*  HCT 37.0 36.0 34.4* 35.1*  PLT 394  --  354 339   Recent Labs  Lab 09/17/21 2115 09/17/21 2204 09/18/21 0328 09/19/21 0311  NA 133* 136 133* 136  K 3.9 4.1 4.0 3.7  CL 104  --  101 100  CO2 20*  --  20* 25  BUN 10  --  9 17  CREATININE 0.91  --  0.86 0.91  CALCIUM 8.8*  --  8.8* 8.8*  PROT 7.1  --   --   --   BILITOT 0.6  --   --   --   ALKPHOS 99  --   --   --   ALT 15  --   --   --   AST 23  --   --   --   GLUCOSE 291*  --  308* 128*    Imaging/Diagnostic Tests: ECHOCARDIOGRAM COMPLETE  Result Date: 09/18/2021    ECHOCARDIOGRAM REPORT   Patient Name:   [name] Date of Exam: 09/18/2021 Medical Rec #:  [id number]       Height:       59.0 in Accession #:    [id number]      Weight:       186.7 lb Date of Birth:  09/05/1975        BSA:          1.791 m Patient Age:    12 years        BP:           98/62 mmHg Patient Gender: F               HR:           87 bpm. Exam Location:  Inpatient Procedure: 2D Echo Indications:    congestive heart failure  History:        Patient has prior history of Echocardiogram examinations, most                 recent 05/26/2020.  Arrythmias:paroxysmal afib; Risk                 Factors:Hypertension, Dyslipidemia, Sleep Apnea and Diabetes.  Sonographer:    Johny Chess RDCS Referring Phys: [place]  1. Left ventricular ejection fraction, by estimation, is 20%. The left ventricle has severely decreased function. The left ventricle demonstrates global hypokinesis. There is mild concentric left ventricular hypertrophy. Left ventricular diastolic parameters are consistent with Grade III diastolic dysfunction (restrictive). Elevated left ventricular end-diastolic pressure.  2. Right ventricular systolic function is severely reduced. The right ventricular size is normal. There is moderately elevated pulmonary artery systolic pressure. The estimated right ventricular systolic pressure is 85.2 mmHg.  3. Left atrial size was mildly dilated.  4. The mitral valve is normal in structure. Mild mitral valve regurgitation. No evidence of mitral stenosis.  5. The aortic valve is normal in structure. Aortic valve regurgitation is not visualized. No aortic stenosis is present.  6. The inferior vena cava is dilated in size with <50% respiratory variability, suggesting right atrial pressure of 15 mmHg. FINDINGS  Left Ventricle: Left ventricular ejection fraction, by estimation, is 20%. The left ventricle has severely decreased function. The left ventricle demonstrates global hypokinesis. The left ventricular internal cavity size was normal in size. There is mild concentric left ventricular hypertrophy. Left ventricular diastolic parameters are consistent with Grade III diastolic dysfunction (restrictive). Elevated left ventricular end-diastolic pressure. Right Ventricle: The right ventricular size is normal. No increase in right ventricular wall thickness. Right ventricular systolic function is severely reduced. There is moderately elevated pulmonary artery systolic pressure. The tricuspid regurgitant velocity is 2.77 m/s, and with  an assumed right atrial pressure of 15 mmHg, the estimated right ventricular systolic pressure is 77.8 mmHg. Left Atrium: Left atrial size was mildly dilated. Right Atrium: Right atrial size was normal in size. Pericardium: There is no evidence of pericardial effusion. Mitral Valve: The mitral valve is normal in structure. Mild mitral valve regurgitation. No evidence of mitral valve stenosis. Tricuspid Valve: The tricuspid valve is normal in structure. Tricuspid valve regurgitation is mild . No evidence of tricuspid stenosis. Aortic Valve: The aortic valve is normal in structure. Aortic valve regurgitation is not visualized. No aortic stenosis is present. Pulmonic Valve: The pulmonic valve was normal in structure. Pulmonic valve regurgitation  is mild. No evidence of pulmonic stenosis. Aorta: The aortic root is normal in size and structure. Venous: The inferior vena cava is dilated in size with less than 50% respiratory variability, suggesting right atrial pressure of 15 mmHg. IAS/Shunts: No atrial level shunt detected by color flow Doppler.  LEFT VENTRICLE PLAX 2D LVIDd:         4.80 cm   Diastology LVIDs:         4.40 cm   LV e' medial:    4.46 cm/s LV PW:         1.10 cm   LV E/e' medial:  26.2 LV IVS:        1.20 cm   LV e' lateral:   4.57 cm/s LVOT diam:     2.00 cm   LV E/e' lateral: 25.6 LV SV:         29 LV SV Index:   16 LVOT Area:     3.14 cm                           3D Volume EF:                          3D EF:        21 %                          LV EDV:       194 ml                          LV ESV:       154 ml                          LV SV:        41 ml RIGHT VENTRICLE            IVC RV S prime:     7.29 cm/s  IVC diam: 2.10 cm TAPSE (M-mode): 1.2 cm LEFT ATRIUM             Index        RIGHT ATRIUM           Index LA diam:        4.40 cm 2.46 cm/m   RA Area:     13.00 cm LA Vol (A2C):   64.1 ml 35.79 ml/m  RA Volume:   29.80 ml  16.64 ml/m LA Vol (A4C):   62.6 ml 34.95 ml/m LA Biplane Vol: 64.0  ml 35.73 ml/m  AORTIC VALVE LVOT Vmax:   67.30 cm/s LVOT Vmean:  42.100 cm/s LVOT VTI:    0.093 m  AORTA Ao Root diam: 2.80 cm Ao Asc diam:  2.50 cm MITRAL VALVE                TRICUSPID VALVE MV Area (PHT): 5.62 cm     TR Peak grad:   30.7 mmHg MV Decel Time: 135 msec     TR Vmax:        277.00 cm/s MV E velocity: 117.00 cm/s MV A velocity: 52.50 cm/s   SHUNTS MV E/A ratio:  2.23         Systemic VTI:  0.09 m  Systemic Diam: 2.00 cm Fransico Him MD Electronically signed by Fransico Him MD Signature Date/Time: 09/18/2021/11:52:40 AM    Final      Lattie Haw, MD 09/19/2021, 7:44 AM PGY-3, [place] Intern pager: [phone], text pages welcome

## 2021-09-19 NOTE — Progress Notes (Signed)
ANTICOAGULATION CONSULT NOTE - Follow-up Consult  Pharmacy Consult to stop warfarin and start heparin Indication: atrial fibrillation  Allergies  Allergen Reactions   Shellfish Allergy Swelling    Airway involvement including EMS - Has Epi-Pen   Dulaglutide Nausea And Vomiting and Other (See Comments)    Multiple episodes of vomiting over multiple days   Metformin And Related Diarrhea   Ibuprofen Other (See Comments)    Per PCP interferes with daily meds (heart failure)    Patient Measurements: Height: 4\' 11"  (149.9 cm) Weight: 82.8 kg (182 lb 9.6 oz) IBW/kg (Calculated) : 43.2 Heparin dosing weight 62.7  Vital Signs: Temp: 98.1 F (36.7 C) (02/26 1109) Temp Source: Oral (02/26 1109) BP: 86/60 (02/26 1109) Pulse Rate: 73 (02/26 1109)  Labs: Recent Labs    09/17/21 2115 09/17/21 2204 09/17/21 2335 09/18/21 0328 09/18/21 0528 09/18/21 2011 09/19/21 0311 09/19/21 1040  HGB 11.8* 12.2  --   --  11.1*  --  11.4*  --   HCT 37.0 36.0  --   --  34.4*  --  35.1*  --   PLT 394  --   --   --  354  --  339  --   APTT 31  --   --   --   --   --   --   --   LABPROT 17.5*  --   --  18.5*  --   --  21.9*  --   INR 1.4*  --   --  1.5*  --   --  1.9*  --   HEPARINUNFRC  --   --   --   --   --  <0.10* 0.22* 0.60  CREATININE 0.91  --   --  0.86  --   --  0.91  --   TROPONINIHS 23*  --  17  --   --   --   --   --      Estimated Creatinine Clearance: 74.2 mL/min (by C-G formula based on SCr of 0.91 mg/dL).  Assessment: 46 yo F admitted with shortness of breath and CP.  Pt on warfarin PTA for afib.  INR  subtherapeutic on admission.  Pt was initially restarted on warfarin but now switched to heparin with plans for cardiac cath.  CTA read negative for PE. CBC stable, no bleeding noted. Will follow up plans to resume warfarin after cath.  Heparin level 0.60 on infusion at 1150 units/hr. No issues with line or bleeding reported per RN.  Goal of Therapy:  INR 2-3; Heparin level  0.3-0.7 units/ml Monitor platelets by anticoagulation protocol: Yes   Plan:  Continue heparin at 1350 units/hr Check 6 hour heparin level to confirm  Thank you for allowing pharmacy to participate in this patient's care.  Reatha Harps, PharmD PGY1 Pharmacy Resident 09/19/2021 12:42 PM Check [email or web address] for unit specific pharmacy number

## 2021-09-19 NOTE — Progress Notes (Signed)
ANTICOAGULATION CONSULT NOTE - Follow-up Consult  Pharmacy Consult to stop warfarin and start heparin Indication: atrial fibrillation  Allergies  Allergen Reactions   Shellfish Allergy Swelling    Airway involvement including EMS - Has Epi-Pen   Dulaglutide Nausea And Vomiting and Other (See Comments)    Multiple episodes of vomiting over multiple days   Metformin And Related Diarrhea   Ibuprofen Other (See Comments)    Per PCP interferes with daily meds (heart failure)    Patient Measurements: Height: 4\' 11"  (149.9 cm) Weight: 84.7 kg (186 lb 11.2 oz) IBW/kg (Calculated) : 43.2 Heparin dosing weight 62.7  Vital Signs: Temp: 98.2 F (36.8 C) (02/26 0321) Temp Source: Oral (02/26 0321) BP: 115/73 (02/26 0321) Pulse Rate: 81 (02/26 0321)  Labs: Recent Labs    09/17/21 2115 09/17/21 2204 09/17/21 2335 09/18/21 0328 09/18/21 0528 09/18/21 2011 09/19/21 0311  HGB 11.8* 12.2  --   --  11.1*  --  11.4*  HCT 37.0 36.0  --   --  34.4*  --  35.1*  PLT 394  --   --   --  354  --  339  APTT 31  --   --   --   --   --   --   LABPROT 17.5*  --   --  18.5*  --   --  21.9*  INR 1.4*  --   --  1.5*  --   --  1.9*  HEPARINUNFRC  --   --   --   --   --  <0.10* 0.22*  CREATININE 0.91  --   --  0.86  --   --  0.91  TROPONINIHS 23*  --  17  --   --   --   --      Estimated Creatinine Clearance: 75.3 mL/min (by C-G formula based on SCr of 0.91 mg/dL).  Assessment: 46 yo F admitted with shortness of breath and CP.  Pt on warfarin PTA for afib.  INR  subtherapeutic on admission.  Pt was initially restarted on warfarin but now switched to heparin with plans for cardiac cath.  CTA read negative for PE. CBC stable, no bleeding noted.  Heparin level 0.22 on infusion at 1150 units/hr. No issues with line or bleeding reported per RN.  Goal of Therapy:  INR 2-3; Heparin level 0.3-0.7 units/ml Monitor platelets by anticoagulation protocol: Yes   Plan:  Increase heparin to 1350  units/hr Check 6 hour heparin level  [employer], Pharm.D., BCPS Clinical Pharmacist **Pharmacist phone directory can be found on [email or web address] listed under [place].  09/19/2021 4:26 AM

## 2021-09-19 NOTE — H&P (View-Only) (Signed)
Progress Note  Patient Name: [name] Date of Encounter: 09/19/2021  Primary Cardiologist: Loralie Champagne, MD  Subjective   Less short of breath today.  Still has intermittent episodes of chest discomfort.  Inpatient Medications    Scheduled Meds:  atorvastatin  40 mg Oral Daily   carvedilol  12.5 mg Oral BID WC   digoxin  0.125 mg Oral Daily   empagliflozin  25 mg Oral Daily   furosemide  40 mg Intravenous BID   gabapentin  100 mg Oral BID   insulin aspart  0-15 Units Subcutaneous TID WC   insulin glargine-yfgn  18 Units Subcutaneous Daily   sertraline  25 mg Oral Daily   spironolactone  12.5 mg Oral Daily   Continuous Infusions:  heparin 1,350 Units/hr (09/19/21 0451)   PRN Meds: acetaminophen, albuterol, hydrOXYzine, oxyCODONE   Vital Signs    Vitals:   09/18/21 2050 09/19/21 0321 09/19/21 0456 09/19/21 0906  BP: 92/64 115/73  103/72  Pulse:  81  84  Resp: 16 11  16   Temp:  98.2 F (36.8 C)  98.2 F (36.8 C)  TempSrc:  Oral  Oral  SpO2:  100%  100%  Weight:   82.8 kg   Height:        Intake/Output Summary (Last 24 hours) at 09/19/2021 0947 Last data filed at 09/19/2021 1517 Gross per 24 hour  Intake 2025.38 ml  Output 3775 ml  Net -1749.62 ml   Filed Weights   09/17/21 2039 09/18/21 0247 09/19/21 0456  Weight: 83 kg 84.7 kg 82.8 kg    Telemetry    Sinus rhythm.  Personally reviewed.  ECG    No ECG reviewed.  Physical Exam   GEN: No acute distress.   Neck: No JVD. Cardiac: RRR, no murmur, rub, or gallop.  Respiratory: Nonlabored. Clear to auscultation bilaterally. GI: Soft, nontender, bowel sounds present. MS: Mild lower leg edema; No deformity. Neuro:  Nonfocal. Psych: Alert and oriented x 3. Normal affect.  Labs    Chemistry Recent Labs  Lab 09/17/21 2115 09/17/21 2204 09/18/21 0328 09/19/21 0311  NA 133* 136 133* 136  K 3.9 4.1 4.0 3.7  CL 104  --  101 100  CO2 20*  --  20* 25  GLUCOSE 291*  --  308* 128*  BUN 10   --  9 17  CREATININE 0.91  --  0.86 0.91  CALCIUM 8.8*  --  8.8* 8.8*  PROT 7.1  --   --   --   ALBUMIN 2.8*  --   --   --   AST 23  --   --   --   ALT 15  --   --   --   ALKPHOS 99  --   --   --   BILITOT 0.6  --   --   --   GFRNONAA >60  --  >60 >60  ANIONGAP 9  --  12 11     Hematology Recent Labs  Lab 09/17/21 2115 09/17/21 2204 09/18/21 0528 09/19/21 0311  WBC 14.5*  --  13.6* 11.2*  RBC 4.86  --  4.60 4.73  HGB 11.8* 12.2 11.1* 11.4*  HCT 37.0 36.0 34.4* 35.1*  MCV 76.1*  --  74.8* 74.2*  MCH 24.3*  --  24.1* 24.1*  MCHC 31.9  --  32.3 32.5  RDW 16.5*  --  16.3* 16.2*  PLT 394  --  354 339    Cardiac Enzymes  Recent Labs  Lab 09/17/21 2115 09/17/21 2335  TROPONINIHS 23* 17    BNP Recent Labs  Lab 09/17/21 2115  BNP 304.0*     Radiology    CT Angio Chest PE W and/or Wo Contrast  Result Date: 09/17/2021 CLINICAL DATA:  Shortness of breath EXAM: CT ANGIOGRAPHY CHEST WITH CONTRAST TECHNIQUE: Multidetector CT imaging of the chest was performed using the standard protocol during bolus administration of intravenous contrast. Multiplanar CT image reconstructions and MIPs were obtained to evaluate the vascular anatomy. RADIATION DOSE REDUCTION: This exam was performed according to the departmental dose-optimization program which includes automated exposure control, adjustment of the mA and/or kV according to patient size and/or use of iterative reconstruction technique. CONTRAST:  59mL OMNIPAQUE IOHEXOL 350 MG/ML SOLN COMPARISON:  Chest x-ray 09/17/2021, CT chest 07/28/2020 FINDINGS: Cardiovascular: Satisfactory opacification of the pulmonary arteries to the segmental level. No evidence of pulmonary embolism. Cardiomegaly. No pericardial effusion. Nonaneurysmal aorta Mediastinum/Nodes: No enlarged mediastinal, hilar, or axillary lymph nodes. Thyroid gland, trachea, and esophagus demonstrate no significant findings. Lungs/Pleura: Small bilateral effusions. Patchy  perihilar ground-glass density. Central airways thickening. Upper Abdomen: No acute abnormality. Musculoskeletal: No chest wall abnormality. No acute or significant osseous findings. Review of the MIP images confirms the above findings. IMPRESSION: 1. Negative for acute pulmonary embolus. 2. Cardiomegaly with small bilateral effusions and hazy perihilar ground-glass density probably representing edema. 3. Central airways thickening suggestive of airways inflammatory process Electronically Signed   By: Donavan Foil M.D.   On: 09/17/2021 23:12   DG Chest Portable 1 View  Result Date: 09/17/2021 CLINICAL DATA:  Shortness of breath EXAM: PORTABLE CHEST 1 VIEW COMPARISON:  08/12/2020 FINDINGS: Cardiomegaly, vascular congestion and diffuse bilateral interstitial and airspace opacities concerning for edema. No effusions. No acute bony abnormality. IMPRESSION: Cardiomegaly, mild CHF. Electronically Signed   By: Rolm Baptise M.D.   On: 09/17/2021 21:16   ECHOCARDIOGRAM COMPLETE  Result Date: 09/18/2021    ECHOCARDIOGRAM REPORT   Patient Name:   [name] Date of Exam: 09/18/2021 Medical Rec #:  [id number]       Height:       59.0 in Accession #:    [id number]      Weight:       186.7 lb Date of Birth:  [birth year]        BSA:          1.791 m Patient Age:    46 years        BP:           98/62 mmHg Patient Gender: F               HR:           87 bpm. Exam Location:  Inpatient Procedure: 2D Echo Indications:    congestive heart failure  History:        Patient has prior history of Echocardiogram examinations, most                 recent 05/26/2020. Arrythmias:paroxysmal afib; Risk                 Factors:Hypertension, Dyslipidemia, Sleep Apnea and Diabetes.  Sonographer:    Johny Chess RDCS Referring Phys: [place]  1. Left ventricular ejection fraction, by estimation, is 20%. The left ventricle has severely decreased function. The left ventricle demonstrates global hypokinesis.  There is mild concentric left ventricular hypertrophy. Left ventricular diastolic parameters are consistent with Grade III  diastolic dysfunction (restrictive). Elevated left ventricular end-diastolic pressure.  2. Right ventricular systolic function is severely reduced. The right ventricular size is normal. There is moderately elevated pulmonary artery systolic pressure. The estimated right ventricular systolic pressure is 93.5 mmHg.  3. Left atrial size was mildly dilated.  4. The mitral valve is normal in structure. Mild mitral valve regurgitation. No evidence of mitral stenosis.  5. The aortic valve is normal in structure. Aortic valve regurgitation is not visualized. No aortic stenosis is present.  6. The inferior vena cava is dilated in size with <50% respiratory variability, suggesting right atrial pressure of 15 mmHg. FINDINGS  Left Ventricle: Left ventricular ejection fraction, by estimation, is 20%. The left ventricle has severely decreased function. The left ventricle demonstrates global hypokinesis. The left ventricular internal cavity size was normal in size. There is mild concentric left ventricular hypertrophy. Left ventricular diastolic parameters are consistent with Grade III diastolic dysfunction (restrictive). Elevated left ventricular end-diastolic pressure. Right Ventricle: The right ventricular size is normal. No increase in right ventricular wall thickness. Right ventricular systolic function is severely reduced. There is moderately elevated pulmonary artery systolic pressure. The tricuspid regurgitant velocity is 2.77 m/s, and with an assumed right atrial pressure of 15 mmHg, the estimated right ventricular systolic pressure is 70.1 mmHg. Left Atrium: Left atrial size was mildly dilated. Right Atrium: Right atrial size was normal in size. Pericardium: There is no evidence of pericardial effusion. Mitral Valve: The mitral valve is normal in structure. Mild mitral valve regurgitation. No  evidence of mitral valve stenosis. Tricuspid Valve: The tricuspid valve is normal in structure. Tricuspid valve regurgitation is mild . No evidence of tricuspid stenosis. Aortic Valve: The aortic valve is normal in structure. Aortic valve regurgitation is not visualized. No aortic stenosis is present. Pulmonic Valve: The pulmonic valve was normal in structure. Pulmonic valve regurgitation is mild. No evidence of pulmonic stenosis. Aorta: The aortic root is normal in size and structure. Venous: The inferior vena cava is dilated in size with less than 50% respiratory variability, suggesting right atrial pressure of 15 mmHg. IAS/Shunts: No atrial level shunt detected by color flow Doppler.  LEFT VENTRICLE PLAX 2D LVIDd:         4.80 cm   Diastology LVIDs:         4.40 cm   LV e' medial:    4.46 cm/s LV PW:         1.10 cm   LV E/e' medial:  26.2 LV IVS:        1.20 cm   LV e' lateral:   4.57 cm/s LVOT diam:     2.00 cm   LV E/e' lateral: 25.6 LV SV:         29 LV SV Index:   16 LVOT Area:     3.14 cm                           3D Volume EF:                          3D EF:        21 %                          LV EDV:       194 ml  LV ESV:       154 ml                          LV SV:        41 ml RIGHT VENTRICLE            IVC RV S prime:     7.29 cm/s  IVC diam: 2.10 cm TAPSE (M-mode): 1.2 cm LEFT ATRIUM             Index        RIGHT ATRIUM           Index LA diam:        4.40 cm 2.46 cm/m   RA Area:     13.00 cm LA Vol (A2C):   64.1 ml 35.79 ml/m  RA Volume:   29.80 ml  16.64 ml/m LA Vol (A4C):   62.6 ml 34.95 ml/m LA Biplane Vol: 64.0 ml 35.73 ml/m  AORTIC VALVE LVOT Vmax:   67.30 cm/s LVOT Vmean:  42.100 cm/s LVOT VTI:    0.093 m  AORTA Ao Root diam: 2.80 cm Ao Asc diam:  2.50 cm MITRAL VALVE                TRICUSPID VALVE MV Area (PHT): 5.62 cm     TR Peak grad:   30.7 mmHg MV Decel Time: 135 msec     TR Vmax:        277.00 cm/s MV E velocity: 117.00 cm/s MV A velocity: 52.50 cm/s    SHUNTS MV E/A ratio:  2.23         Systemic VTI:  0.09 m                             Systemic Diam: 2.00 cm Fransico Him MD Electronically signed by Fransico Him MD Signature Date/Time: 09/18/2021/11:52:40 AM    Final      Assessment & Plan    1.  HFrEF with acute and chronic systolic heart failure presenting with increasing shortness of breath and chest tightness, approximately 3 pound weight gain, leg swelling.  She has a history of nonischemic cardiomyopathy diagnosed in 2020.  She reports compliance with all of her medications, symptom onset about 4 days ago without other obvious precipitant. Follow-up echocardiogram this admission shows LVEF down to 20%, had previously been 35 to 40% by evaluation in 2021.  Also severe RV dysfunction with moderately elevated RVSP of 46 mmHg.  With initial resumption of baseline medications she became hypotensive, regimen has been cut back and she continues on IV Lasix with net urine output of approximately 1700 cc last 24 hours.   2.  Paroxysmal atrial fibrillation, on Coumadin as an outpatient (prior history of LV thrombus so not on DOAC).  INR subtherapeutic at 1.5 presentation.  She has been transitioned to heparin for now.   3.  Essential hypertension.  Recent systolics low 716R.   4.  Poorly controlled type 2 diabetes mellitus on insulin and Jardiance.  Last hemoglobin A1c 13.2%.   5.  Chest CTA negative for pulmonary embolus but showing central airway thickening suggesting possible inflammatory process.  Also small bilateral pleural effusions.  Heart failure team to resume follow-up tomorrow, she will be seen by Dr. Aundra Dubin.  Tentatively scheduling her for a right and left heart catheterization.  Unlikely that she has developed obstructive CAD since 2020, however with  apparent sudden change in symptoms and limitations in current medical regimen, need to reevaluate both coronaries and hemodynamics.  Signed, Rozann Lesches, MD  09/19/2021, 9:47 AM

## 2021-09-19 NOTE — Progress Notes (Signed)
FPTS Brief Progress Note  S: Pt sleeping peacefully.    O: BP 109/76 (BP Location: Right Arm)    Pulse 73    Temp 98.2 F (36.8 C) (Oral)    Resp 18    Ht 4\' 11"  (1.499 m)    Wt 82.8 kg    SpO2 100%    BMI 36.88 kg/m   General: sleeping in bed  Respiratory: No respiratory distress Skin: no rashes noted    A/P: HFrEF with nonischemic cardiomyopathy  - Continue with Coreg, Dig, Fritzi Mandes - Cardiology following  - Watching for soft bps>has had soft bp during this admission  - Monitoring I&Os >Total net - 1.3L  pAFIb   H/o left ventricular thrombus  -Heparin gtt -Plans for cardiac cath>unsure if definitive date is set  HTN  Coreg held yesterday given soft bps - will monitor ON  T2DM - LAI 18U - Izetta Dakin, MD 09/20/2021, 12:02 AM PGY-1, [facility] Night Resident  Please page [phone] with questions.

## 2021-09-19 NOTE — Progress Notes (Signed)
ANTICOAGULATION CONSULT NOTE - Follow-up Consult  Pharmacy Consult to stop warfarin and start heparin Indication: atrial fibrillation  Allergies  Allergen Reactions   Shellfish Allergy Swelling    Airway involvement including EMS - Has Epi-Pen   Dulaglutide Nausea And Vomiting and Other (See Comments)    Multiple episodes of vomiting over multiple days   Metformin And Related Diarrhea   Ibuprofen Other (See Comments)    Per PCP interferes with daily meds (heart failure)    Patient Measurements: Height: 4\' 11"  (149.9 cm) Weight: 82.8 kg (182 lb 9.6 oz) IBW/kg (Calculated) : 43.2 Heparin dosing weight 62.7  Vital Signs: Temp: 98.1 F (36.7 C) (02/26 1109) Temp Source: Oral (02/26 1109) BP: 86/60 (02/26 1109) Pulse Rate: 73 (02/26 1109)  Labs: Recent Labs    09/17/21 2115 09/17/21 2204 09/17/21 2335 09/18/21 0328 09/18/21 0528 09/18/21 2011 09/19/21 0311 09/19/21 1040 09/19/21 1722  HGB 11.8* 12.2  --   --  11.1*  --  11.4*  --   --   HCT 37.0 36.0  --   --  34.4*  --  35.1*  --   --   PLT 394  --   --   --  354  --  339  --   --   APTT 31  --   --   --   --   --   --   --   --   LABPROT 17.5*  --   --  18.5*  --   --  21.9*  --   --   INR 1.4*  --   --  1.5*  --   --  1.9*  --   --   HEPARINUNFRC  --   --   --   --   --    < > 0.22* 0.60 0.92*  CREATININE 0.91  --   --  0.86  --   --  0.91  --   --   TROPONINIHS 23*  --  17  --   --   --   --   --   --    < > = values in this interval not displayed.     Estimated Creatinine Clearance: 74.2 mL/min (by C-G formula based on SCr of 0.91 mg/dL).  Assessment: 46 yo F admitted with shortness of breath and CP.  Pt on warfarin PTA for afib.  INR  subtherapeutic (1.4) on admission.  Pt was initially restarted on warfarin but now switched to heparin with plans for cardiac cath.  CTA read negative for PE. Will follow up plans to resume warfarin after cath.  INR up to 1.9 today, Hgb stable. Heparin level up to  supratherapeutic (0.92) on infusion at 1350 units/hr. No issues with line or bleeding reported per RN.  Goal of Therapy:  INR 2-3; Heparin level 0.3-0.7 units/ml Monitor platelets by anticoagulation protocol: Yes   Plan:  Decrease heparin to 1200 units/hr F/u 6 hr heparin level   Thank you for allowing pharmacy to participate in this patient's care.  Sherlon Handing, PharmD, BCPS Please see amion for complete clinical pharmacist phone list 09/19/2021 6:29 PM

## 2021-09-19 NOTE — Progress Notes (Signed)
Progress Note  Patient Name: [name] Date of Encounter: 09/19/2021  Primary Cardiologist: Loralie Champagne, MD  Subjective   Less short of breath today.  Still has intermittent episodes of chest discomfort.  Inpatient Medications    Scheduled Meds:  atorvastatin  40 mg Oral Daily   carvedilol  12.5 mg Oral BID WC   digoxin  0.125 mg Oral Daily   empagliflozin  25 mg Oral Daily   furosemide  40 mg Intravenous BID   gabapentin  100 mg Oral BID   insulin aspart  0-15 Units Subcutaneous TID WC   insulin glargine-yfgn  18 Units Subcutaneous Daily   sertraline  25 mg Oral Daily   spironolactone  12.5 mg Oral Daily   Continuous Infusions:  heparin 1,350 Units/hr (09/19/21 0451)   PRN Meds: acetaminophen, albuterol, hydrOXYzine, oxyCODONE   Vital Signs    Vitals:   09/18/21 2050 09/19/21 0321 09/19/21 0456 09/19/21 0906  BP: 92/64 115/73  103/72  Pulse:  81  84  Resp: 16 11  16   Temp:  98.2 F (36.8 C)  98.2 F (36.8 C)  TempSrc:  Oral  Oral  SpO2:  100%  100%  Weight:   82.8 kg   Height:        Intake/Output Summary (Last 24 hours) at 09/19/2021 0947 Last data filed at 09/19/2021 1740 Gross per 24 hour  Intake 2025.38 ml  Output 3775 ml  Net -1749.62 ml   Filed Weights   09/17/21 2039 09/18/21 0247 09/19/21 0456  Weight: 83 kg 84.7 kg 82.8 kg    Telemetry    Sinus rhythm.  Personally reviewed.  ECG    No ECG reviewed.  Physical Exam   GEN: No acute distress.   Neck: No JVD. Cardiac: RRR, no murmur, rub, or gallop.  Respiratory: Nonlabored. Clear to auscultation bilaterally. GI: Soft, nontender, bowel sounds present. MS: Mild lower leg edema; No deformity. Neuro:  Nonfocal. Psych: Alert and oriented x 3. Normal affect.  Labs    Chemistry Recent Labs  Lab 09/17/21 2115 09/17/21 2204 09/18/21 0328 09/19/21 0311  NA 133* 136 133* 136  K 3.9 4.1 4.0 3.7  CL 104  --  101 100  CO2 20*  --  20* 25  GLUCOSE 291*  --  308* 128*  BUN 10   --  9 17  CREATININE 0.91  --  0.86 0.91  CALCIUM 8.8*  --  8.8* 8.8*  PROT 7.1  --   --   --   ALBUMIN 2.8*  --   --   --   AST 23  --   --   --   ALT 15  --   --   --   ALKPHOS 99  --   --   --   BILITOT 0.6  --   --   --   GFRNONAA >60  --  >60 >60  ANIONGAP 9  --  12 11     Hematology Recent Labs  Lab 09/17/21 2115 09/17/21 2204 09/18/21 0528 09/19/21 0311  WBC 14.5*  --  13.6* 11.2*  RBC 4.86  --  4.60 4.73  HGB 11.8* 12.2 11.1* 11.4*  HCT 37.0 36.0 34.4* 35.1*  MCV 76.1*  --  74.8* 74.2*  MCH 24.3*  --  24.1* 24.1*  MCHC 31.9  --  32.3 32.5  RDW 16.5*  --  16.3* 16.2*  PLT 394  --  354 339    Cardiac Enzymes  Recent Labs  Lab 09/17/21 2115 09/17/21 2335  TROPONINIHS 23* 17    BNP Recent Labs  Lab 09/17/21 2115  BNP 304.0*     Radiology    CT Angio Chest PE W and/or Wo Contrast  Result Date: 09/17/2021 CLINICAL DATA:  Shortness of breath EXAM: CT ANGIOGRAPHY CHEST WITH CONTRAST TECHNIQUE: Multidetector CT imaging of the chest was performed using the standard protocol during bolus administration of intravenous contrast. Multiplanar CT image reconstructions and MIPs were obtained to evaluate the vascular anatomy. RADIATION DOSE REDUCTION: This exam was performed according to the departmental dose-optimization program which includes automated exposure control, adjustment of the mA and/or kV according to patient size and/or use of iterative reconstruction technique. CONTRAST:  76mL OMNIPAQUE IOHEXOL 350 MG/ML SOLN COMPARISON:  Chest x-ray 09/17/2021, CT chest 07/28/2020 FINDINGS: Cardiovascular: Satisfactory opacification of the pulmonary arteries to the segmental level. No evidence of pulmonary embolism. Cardiomegaly. No pericardial effusion. Nonaneurysmal aorta Mediastinum/Nodes: No enlarged mediastinal, hilar, or axillary lymph nodes. Thyroid gland, trachea, and esophagus demonstrate no significant findings. Lungs/Pleura: Small bilateral effusions. Patchy  perihilar ground-glass density. Central airways thickening. Upper Abdomen: No acute abnormality. Musculoskeletal: No chest wall abnormality. No acute or significant osseous findings. Review of the MIP images confirms the above findings. IMPRESSION: 1. Negative for acute pulmonary embolus. 2. Cardiomegaly with small bilateral effusions and hazy perihilar ground-glass density probably representing edema. 3. Central airways thickening suggestive of airways inflammatory process Electronically Signed   By: Donavan Foil M.D.   On: 09/17/2021 23:12   DG Chest Portable 1 View  Result Date: 09/17/2021 CLINICAL DATA:  Shortness of breath EXAM: PORTABLE CHEST 1 VIEW COMPARISON:  08/12/2020 FINDINGS: Cardiomegaly, vascular congestion and diffuse bilateral interstitial and airspace opacities concerning for edema. No effusions. No acute bony abnormality. IMPRESSION: Cardiomegaly, mild CHF. Electronically Signed   By: Rolm Baptise M.D.   On: 09/17/2021 21:16   ECHOCARDIOGRAM COMPLETE  Result Date: 09/18/2021    ECHOCARDIOGRAM REPORT   Patient Name:   [name] Date of Exam: 09/18/2021 Medical Rec #:  [id number]       Height:       59.0 in Accession #:    [id number]      Weight:       186.7 lb Date of Birth:  April 04, 1976        BSA:          1.791 m Patient Age:    46 years        BP:           98/62 mmHg Patient Gender: F               HR:           87 bpm. Exam Location:  Inpatient Procedure: 2D Echo Indications:    congestive heart failure  History:        Patient has prior history of Echocardiogram examinations, most                 recent 05/26/2020. Arrythmias:paroxysmal afib; Risk                 Factors:Hypertension, Dyslipidemia, Sleep Apnea and Diabetes.  Sonographer:    Johny Chess RDCS Referring Phys: [place]  1. Left ventricular ejection fraction, by estimation, is 20%. The left ventricle has severely decreased function. The left ventricle demonstrates global hypokinesis.  There is mild concentric left ventricular hypertrophy. Left ventricular diastolic parameters are consistent with Grade III  diastolic dysfunction (restrictive). Elevated left ventricular end-diastolic pressure.  2. Right ventricular systolic function is severely reduced. The right ventricular size is normal. There is moderately elevated pulmonary artery systolic pressure. The estimated right ventricular systolic pressure is 94.8 mmHg.  3. Left atrial size was mildly dilated.  4. The mitral valve is normal in structure. Mild mitral valve regurgitation. No evidence of mitral stenosis.  5. The aortic valve is normal in structure. Aortic valve regurgitation is not visualized. No aortic stenosis is present.  6. The inferior vena cava is dilated in size with <50% respiratory variability, suggesting right atrial pressure of 15 mmHg. FINDINGS  Left Ventricle: Left ventricular ejection fraction, by estimation, is 20%. The left ventricle has severely decreased function. The left ventricle demonstrates global hypokinesis. The left ventricular internal cavity size was normal in size. There is mild concentric left ventricular hypertrophy. Left ventricular diastolic parameters are consistent with Grade III diastolic dysfunction (restrictive). Elevated left ventricular end-diastolic pressure. Right Ventricle: The right ventricular size is normal. No increase in right ventricular wall thickness. Right ventricular systolic function is severely reduced. There is moderately elevated pulmonary artery systolic pressure. The tricuspid regurgitant velocity is 2.77 m/s, and with an assumed right atrial pressure of 15 mmHg, the estimated right ventricular systolic pressure is 54.6 mmHg. Left Atrium: Left atrial size was mildly dilated. Right Atrium: Right atrial size was normal in size. Pericardium: There is no evidence of pericardial effusion. Mitral Valve: The mitral valve is normal in structure. Mild mitral valve regurgitation. No  evidence of mitral valve stenosis. Tricuspid Valve: The tricuspid valve is normal in structure. Tricuspid valve regurgitation is mild . No evidence of tricuspid stenosis. Aortic Valve: The aortic valve is normal in structure. Aortic valve regurgitation is not visualized. No aortic stenosis is present. Pulmonic Valve: The pulmonic valve was normal in structure. Pulmonic valve regurgitation is mild. No evidence of pulmonic stenosis. Aorta: The aortic root is normal in size and structure. Venous: The inferior vena cava is dilated in size with less than 50% respiratory variability, suggesting right atrial pressure of 15 mmHg. IAS/Shunts: No atrial level shunt detected by color flow Doppler.  LEFT VENTRICLE PLAX 2D LVIDd:         4.80 cm   Diastology LVIDs:         4.40 cm   LV e' medial:    4.46 cm/s LV PW:         1.10 cm   LV E/e' medial:  26.2 LV IVS:        1.20 cm   LV e' lateral:   4.57 cm/s LVOT diam:     2.00 cm   LV E/e' lateral: 25.6 LV SV:         29 LV SV Index:   16 LVOT Area:     3.14 cm                           3D Volume EF:                          3D EF:        21 %                          LV EDV:       194 ml  LV ESV:       154 ml                          LV SV:        41 ml RIGHT VENTRICLE            IVC RV S prime:     7.29 cm/s  IVC diam: 2.10 cm TAPSE (M-mode): 1.2 cm LEFT ATRIUM             Index        RIGHT ATRIUM           Index LA diam:        4.40 cm 2.46 cm/m   RA Area:     13.00 cm LA Vol (A2C):   64.1 ml 35.79 ml/m  RA Volume:   29.80 ml  16.64 ml/m LA Vol (A4C):   62.6 ml 34.95 ml/m LA Biplane Vol: 64.0 ml 35.73 ml/m  AORTIC VALVE LVOT Vmax:   67.30 cm/s LVOT Vmean:  42.100 cm/s LVOT VTI:    0.093 m  AORTA Ao Root diam: 2.80 cm Ao Asc diam:  2.50 cm MITRAL VALVE                TRICUSPID VALVE MV Area (PHT): 5.62 cm     TR Peak grad:   30.7 mmHg MV Decel Time: 135 msec     TR Vmax:        277.00 cm/s MV E velocity: 117.00 cm/s MV A velocity: 52.50 cm/s    SHUNTS MV E/A ratio:  2.23         Systemic VTI:  0.09 m                             Systemic Diam: 2.00 cm Fransico Him MD Electronically signed by Fransico Him MD Signature Date/Time: 09/18/2021/11:52:40 AM    Final      Assessment & Plan    1.  HFrEF with acute and chronic systolic heart failure presenting with increasing shortness of breath and chest tightness, approximately 3 pound weight gain, leg swelling.  She has a history of nonischemic cardiomyopathy diagnosed in 2020.  She reports compliance with all of her medications, symptom onset about 4 days ago without other obvious precipitant. Follow-up echocardiogram this admission shows LVEF down to 20%, had previously been 35 to 40% by evaluation in 2021.  Also severe RV dysfunction with moderately elevated RVSP of 46 mmHg.  With initial resumption of baseline medications she became hypotensive, regimen has been cut back and she continues on IV Lasix with net urine output of approximately 1700 cc last 24 hours.   2.  Paroxysmal atrial fibrillation, on Coumadin as an outpatient (prior history of LV thrombus so not on DOAC).  INR subtherapeutic at 1.5 presentation.  She has been transitioned to heparin for now.   3.  Essential hypertension.  Recent systolics low 106Y.   4.  Poorly controlled type 2 diabetes mellitus on insulin and Jardiance.  Last hemoglobin A1c 13.2%.   5.  Chest CTA negative for pulmonary embolus but showing central airway thickening suggesting possible inflammatory process.  Also small bilateral pleural effusions.  Heart failure team to resume follow-up tomorrow, she will be seen by Dr. Aundra Dubin.  Tentatively scheduling her for a right and left heart catheterization.  Unlikely that she has developed obstructive CAD since 2020, however with  apparent sudden change in symptoms and limitations in current medical regimen, need to reevaluate both coronaries and hemodynamics.  Signed, Rozann Lesches, MD  09/19/2021, 9:47 AM

## 2021-09-20 ENCOUNTER — Encounter (HOSPITAL_COMMUNITY)
Admission: EM | Disposition: A | Discharge status: Home / Self Care | Payer: Self-pay | Source: Home / Self Care | Attending: Family Medicine

## 2021-09-20 ENCOUNTER — Encounter (HOSPITAL_COMMUNITY): Payer: Self-pay | Admitting: Cardiology

## 2021-09-20 ENCOUNTER — Inpatient Hospital Stay (HOSPITAL_COMMUNITY): Payer: Medicaid Other

## 2021-09-20 DIAGNOSIS — I2510 Atherosclerotic heart disease of native coronary artery without angina pectoris: Secondary | ICD-10-CM | POA: Diagnosis not present

## 2021-09-20 DIAGNOSIS — I5023 Acute on chronic systolic (congestive) heart failure: Secondary | ICD-10-CM | POA: Diagnosis not present

## 2021-09-20 DIAGNOSIS — E119 Type 2 diabetes mellitus without complications: Secondary | ICD-10-CM | POA: Diagnosis not present

## 2021-09-20 DIAGNOSIS — I480 Paroxysmal atrial fibrillation: Secondary | ICD-10-CM | POA: Diagnosis not present

## 2021-09-20 HISTORY — PX: RIGHT/LEFT HEART CATH AND CORONARY ANGIOGRAPHY: CATH118266

## 2021-09-20 LAB — POCT I-STAT EG7
Acid-Base Excess: 4 mmol/L — ABNORMAL HIGH (ref 0.0–2.0)
Acid-Base Excess: 4 mmol/L — ABNORMAL HIGH (ref 0.0–2.0)
Bicarbonate: 30.3 mmol/L — ABNORMAL HIGH (ref 20.0–28.0)
Bicarbonate: 30.9 mmol/L — ABNORMAL HIGH (ref 20.0–28.0)
Calcium, Ion: 1.19 mmol/L (ref 1.15–1.40)
Calcium, Ion: 1.21 mmol/L (ref 1.15–1.40)
HCT: 37 % (ref 36.0–46.0)
HCT: 38 % (ref 36.0–46.0)
Hemoglobin: 12.6 g/dL (ref 12.0–15.0)
Hemoglobin: 12.9 g/dL (ref 12.0–15.0)
O2 Saturation: 70 %
O2 Saturation: 71 %
Potassium: 3.6 mmol/L (ref 3.5–5.1)
Potassium: 3.6 mmol/L (ref 3.5–5.1)
Sodium: 139 mmol/L (ref 135–145)
Sodium: 140 mmol/L (ref 135–145)
TCO2: 32 mmol/L (ref 22–32)
TCO2: 33 mmol/L — ABNORMAL HIGH (ref 22–32)
pCO2, Ven: 53.8 mmHg (ref 44–60)
pCO2, Ven: 54.8 mmHg (ref 44–60)
pH, Ven: 7.358 (ref 7.25–7.43)
pH, Ven: 7.359 (ref 7.25–7.43)
pO2, Ven: 39 mmHg (ref 32–45)
pO2, Ven: 40 mmHg (ref 32–45)

## 2021-09-20 LAB — GLUCOSE, CAPILLARY
Glucose-Capillary: 117 mg/dL — ABNORMAL HIGH (ref 70–99)
Glucose-Capillary: 212 mg/dL — ABNORMAL HIGH (ref 70–99)
Glucose-Capillary: 111 mg/dL — ABNORMAL HIGH (ref 70–99)
Glucose-Capillary: 267 mg/dL — ABNORMAL HIGH (ref 70–99)

## 2021-09-20 LAB — BASIC METABOLIC PANEL
Anion gap: 10 (ref 5–15)
BUN: 17 mg/dL (ref 6–20)
CO2: 28 mmol/L (ref 22–32)
Calcium: 8.7 mg/dL — ABNORMAL LOW (ref 8.9–10.3)
Chloride: 99 mmol/L (ref 98–111)
Creatinine, Ser: 1.1 mg/dL — ABNORMAL HIGH (ref 0.44–1.00)
GFR, Estimated: >60 mL/min (ref >60)
Glucose, Bld: 133 mg/dL — ABNORMAL HIGH (ref 70–99)
Potassium: 4.8 mmol/L (ref 3.5–5.1)
Sodium: 137 mmol/L (ref 135–145)

## 2021-09-20 LAB — CBC
HCT: 39 % (ref 36.0–46.0)
Hemoglobin: 12.4 g/dL (ref 12.0–15.0)
MCH: 23.8 pg — ABNORMAL LOW (ref 26.0–34.0)
MCHC: 31.8 g/dL (ref 30.0–36.0)
MCV: 75 fL — ABNORMAL LOW (ref 80.0–100.0)
Platelets: 388 10*3/uL (ref 150–400)
RBC: 5.2 MIL/uL — ABNORMAL HIGH (ref 3.87–5.11)
RDW: 16.3 % — ABNORMAL HIGH (ref 11.5–15.5)
WBC: 10.7 10*3/uL — ABNORMAL HIGH (ref 4.0–10.5)
nRBC: 0 % (ref 0.0–0.2)

## 2021-09-20 LAB — PROTIME-INR
INR: 1.8 — ABNORMAL HIGH (ref 0.8–1.2)
Prothrombin Time: 20.9 seconds — ABNORMAL HIGH (ref 11.4–15.2)

## 2021-09-20 LAB — HEPARIN LEVEL (UNFRACTIONATED): Heparin Unfractionated: 0.83 IU/mL — ABNORMAL HIGH (ref 0.30–0.70)

## 2021-09-20 LAB — PREGNANCY, URINE: Preg Test, Ur: NEGATIVE

## 2021-09-20 SURGERY — RIGHT/LEFT HEART CATH AND CORONARY ANGIOGRAPHY
Anesthesia: LOCAL

## 2021-09-20 MED ORDER — SODIUM CHLORIDE 0.9 % IV SOLN: 250.0000 mL | INTRAVENOUS | Status: DC | PRN

## 2021-09-20 MED ORDER — HEPARIN (PORCINE) IN NACL 1000-0.9 UT/500ML-% IV SOLN
INTRAVENOUS | Status: DC | PRN
Start: 2021-09-20 — End: 2021-09-20
  Administered 2021-09-20 (×2): 500 mL

## 2021-09-20 MED ORDER — WARFARIN SODIUM 10 MG PO TABS
11.2500 mg | ORAL_TABLET | Freq: Once | ORAL | Status: AC
Start: 2021-09-20 — End: 2021-09-20
  Administered 2021-09-20: 11.25 mg via ORAL
  Filled 2021-09-20: qty 1

## 2021-09-20 MED ORDER — WARFARIN - PHARMACIST DOSING INPATIENT: Freq: Every day | Status: DC

## 2021-09-20 MED ORDER — HEPARIN SODIUM (PORCINE) 1000 UNIT/ML IJ SOLN
INTRAMUSCULAR | Status: DC | PRN
  Administered 2021-09-20: 4000 [IU] via INTRAVENOUS

## 2021-09-20 MED ORDER — MIDAZOLAM HCL 2 MG/2ML IJ SOLN
INTRAMUSCULAR | Status: AC
  Filled 2021-09-20: qty 2

## 2021-09-20 MED ORDER — SODIUM CHLORIDE 0.9% FLUSH
3.0000 mL | Freq: Two times a day (BID) | INTRAVENOUS | Status: DC
  Administered 2021-09-20 – 2021-09-21 (×3): 3 mL via INTRAVENOUS

## 2021-09-20 MED ORDER — FENTANYL CITRATE (PF) 100 MCG/2ML IJ SOLN
INTRAMUSCULAR | Status: AC
  Filled 2021-09-20: qty 2

## 2021-09-20 MED ORDER — SACUBITRIL-VALSARTAN 49-51 MG PO TABS
1.0000 | ORAL_TABLET | Freq: Two times a day (BID) | ORAL | Status: DC
Start: 2021-09-20 — End: 2021-09-22
  Administered 2021-09-20 – 2021-09-22 (×5): 1 via ORAL
  Filled 2021-09-20 (×5): qty 1

## 2021-09-20 MED ORDER — ACETAMINOPHEN 325 MG PO TABS: 650.0000 mg | ORAL_TABLET | ORAL | Status: DC | PRN

## 2021-09-20 MED ORDER — ONDANSETRON HCL 4 MG/2ML IJ SOLN: 4.0000 mg | Freq: Four times a day (QID) | INTRAMUSCULAR | Status: DC | PRN

## 2021-09-20 MED ORDER — VERAPAMIL HCL 2.5 MG/ML IV SOLN
INTRAVENOUS | Status: AC
  Filled 2021-09-20: qty 2

## 2021-09-20 MED ORDER — IOHEXOL 350 MG/ML SOLN
INTRAVENOUS | Status: DC | PRN
  Administered 2021-09-20: 35 mL

## 2021-09-20 MED ORDER — LIDOCAINE HCL (PF) 1 % IJ SOLN
INTRAMUSCULAR | Status: AC
  Filled 2021-09-20: qty 30

## 2021-09-20 MED ORDER — HYDRALAZINE HCL 20 MG/ML IJ SOLN: 10.0000 mg | INTRAMUSCULAR | Status: AC | PRN

## 2021-09-20 MED ORDER — LABETALOL HCL 5 MG/ML IV SOLN: 10.0000 mg | INTRAVENOUS | Status: AC | PRN

## 2021-09-20 MED ORDER — HEPARIN (PORCINE) IN NACL 1000-0.9 UT/500ML-% IV SOLN
INTRAVENOUS | Status: AC
  Filled 2021-09-20: qty 1000

## 2021-09-20 MED ORDER — FENTANYL CITRATE (PF) 100 MCG/2ML IJ SOLN
INTRAMUSCULAR | Status: DC | PRN
Start: 2021-09-20 — End: 2021-09-20
  Administered 2021-09-20: 25 ug via INTRAVENOUS

## 2021-09-20 MED ORDER — ALPRAZOLAM 0.25 MG PO TABS
0.2500 mg | ORAL_TABLET | Freq: Once | ORAL | Status: AC
  Administered 2021-09-20: 0.25 mg via ORAL
  Filled 2021-09-20: qty 1

## 2021-09-20 MED ORDER — FUROSEMIDE 10 MG/ML IJ SOLN
80.0000 mg | Freq: Two times a day (BID) | INTRAMUSCULAR | Status: DC
Start: 2021-09-20 — End: 2021-09-21
  Administered 2021-09-20 – 2021-09-21 (×3): 80 mg via INTRAVENOUS
  Filled 2021-09-20 (×3): qty 8

## 2021-09-20 MED ORDER — SPIRONOLACTONE 25 MG PO TABS
25.0000 mg | ORAL_TABLET | Freq: Every day | ORAL | Status: DC
  Administered 2021-09-20 – 2021-09-22 (×3): 25 mg via ORAL
  Filled 2021-09-20 (×4): qty 1

## 2021-09-20 MED ORDER — MIDAZOLAM HCL 2 MG/2ML IJ SOLN
INTRAMUSCULAR | Status: DC | PRN
Start: 2021-09-20 — End: 2021-09-20
  Administered 2021-09-20: 1 mg via INTRAVENOUS

## 2021-09-20 MED ORDER — SODIUM CHLORIDE 0.9% FLUSH: 3.0000 mL | INTRAVENOUS | Status: DC | PRN

## 2021-09-20 MED ORDER — LIDOCAINE HCL (PF) 1 % IJ SOLN
INTRAMUSCULAR | Status: DC | PRN
  Administered 2021-09-20 (×2): 2 mL via INTRADERMAL

## 2021-09-20 MED ORDER — GADOBUTROL 1 MMOL/ML IV SOLN
10.0000 mL | Freq: Once | INTRAVENOUS | Status: AC | PRN
  Administered 2021-09-20: 10 mL via INTRAVENOUS

## 2021-09-20 MED ORDER — VERAPAMIL HCL 2.5 MG/ML IV SOLN
INTRAVENOUS | Status: DC | PRN
  Administered 2021-09-20: 10 mL via INTRA_ARTERIAL

## 2021-09-20 SURGICAL SUPPLY — 12 items
CATH 5FR JL3.5 JR4 ANG PIG MP (CATHETERS) ×1 IMPLANT
CATH BALLN WEDGE 5F 110CM (CATHETERS) ×1 IMPLANT
DEVICE RAD COMP TR BAND LRG (VASCULAR PRODUCTS) ×1 IMPLANT
GLIDESHEATH SLEND SS 6F .021 (SHEATH) ×1 IMPLANT
GUIDEWIRE .025 260CM (WIRE) ×1 IMPLANT
GUIDEWIRE INQWIRE 1.5J.035X260 (WIRE) IMPLANT
INQWIRE 1.5J .035X260CM (WIRE) ×2
KIT HEART LEFT (KITS) ×2 IMPLANT
PACK CARDIAC CATHETERIZATION (CUSTOM PROCEDURE TRAY) ×2 IMPLANT
SHEATH GLIDE SLENDER 4/5FR (SHEATH) ×1 IMPLANT
SHEATH PROBE COVER 6X72 (BAG) ×1 IMPLANT
TRANSDUCER W/STOPCOCK (MISCELLANEOUS) ×2 IMPLANT

## 2021-09-20 NOTE — Progress Notes (Signed)
UA sent to lab per pt void, pre procedure

## 2021-09-20 NOTE — Progress Notes (Signed)
Was CTB by RN regarding rt forearm discomfort, post cardiac cath via rt radial artery. TR band has been removed.   On exam, pt w/ 1+ radial pulse, audible by doppler. Sensation intact. No swelling, ecchymosis or hematoma. Pt endorsed on mild discomfort and had just received dose of PRN oxycodone.   Conservative management. Continue arm rest. Patient and RN reassured. Continue to monitor.   Lyda Jester, PA-C

## 2021-09-20 NOTE — Progress Notes (Signed)
[name] for Heparin and Warfarin Indication: atrial fibrillation and h/o LV thrombus   Allergies  Allergen Reactions   Shellfish Allergy Swelling    Airway involvement including EMS - Has Epi-Pen   Dulaglutide Nausea And Vomiting and Other (See Comments)    Multiple episodes of vomiting over multiple days   Metformin And Related Diarrhea   Ibuprofen Other (See Comments)    Per PCP interferes with daily meds (heart failure)    Patient Measurements: Height: 4\' 11"  (149.9 cm) Weight: 82.4 kg (181 lb 10.5 oz) IBW/kg (Calculated) : 43.2  Heparin Dosing Weight: 63 kg  Vital Signs: Temp: 98.1 F (36.7 C) (02/27 0406) Temp Source: Oral (02/27 0406) BP: 130/94 (02/27 0854) Pulse Rate: 85 (02/27 0854)  Labs: Recent Labs    09/17/21 2115 09/17/21 2204 09/17/21 2335 09/18/21 0328 09/18/21 0528 09/18/21 2011 09/19/21 0311 09/19/21 1040 09/19/21 1722 09/20/21 0445  HGB 11.8*   < >  --   --  11.1*  --  11.4*  --   --  12.4  HCT 37.0   < >  --   --  34.4*  --  35.1*  --   --  39.0  PLT 394  --   --   --  354  --  339  --   --  388  APTT 31  --   --   --   --   --   --   --   --   --   LABPROT 17.5*  --   --  18.5*  --   --  21.9*  --   --  20.9*  INR 1.4*  --   --  1.5*  --   --  1.9*  --   --  1.8*  HEPARINUNFRC  --   --   --   --   --    < > 0.22* 0.60 0.92* 0.83*  CREATININE 0.91  --   --  0.86  --   --  0.91  --   --  1.10*  TROPONINIHS 23*  --  17  --   --   --   --   --   --   --    < > = values in this interval not displayed.    Estimated Creatinine Clearance: 61.3 mL/min (A) (by C-G formula based on SCr of 1.1 mg/dL (H)).   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 INR subtherapeutic at 1.4 on admission. Pt was initially restarted on warfarin but transitioned to heparin with plans for cardiac cath  2/27. Pharmacy now consulted to resume warfarin.   Anticoagulation 09/03/21 Warfarin Regimen:   2.0-3.0  TTR:  38.5 % (1.9 y)  INR used for dosing:  2.2 (09/03/2021)  Warfarin maintenance plan:  11.25 mg (7.5 mg x 1.5) every day  Weekly warfarin total:  78.75 mg   Last dose of warfarin 15mg  given 2/25. INR has remained barely subtherapeutic. Confirmed with HF team, will stop heparin infusion to prevent bleeding. Will resume PTA warfarin dose and trend INR. Patient previously NPO, now with regular diet resumed 2/27.   Goal of Therapy:  INR 2-3 Heparin level 0.3-0.7 units/ml Monitor platelets by anticoagulation protocol: Yes   Plan:  Stop heparin infusion Start warfarin 11.25 mg PO x1 dose Check INR daily while on warfarin  Continue to monitor H&H and platelets    Thank you for allowing pharmacy to be a part of this patients care.  Ardyth Harps, PharmD Clinical Pharmacist

## 2021-09-20 NOTE — Progress Notes (Signed)
ANTICOAGULATION CONSULT NOTE - Follow-up Consult  Pharmacy Consult to stop warfarin and start heparin Indication: atrial fibrillation  Allergies  Allergen Reactions   Shellfish Allergy Swelling    Airway involvement including EMS - Has Epi-Pen   Dulaglutide Nausea And Vomiting and Other (See Comments)    Multiple episodes of vomiting over multiple days   Metformin And Related Diarrhea   Ibuprofen Other (See Comments)    Per PCP interferes with daily meds (heart failure)    Patient Measurements: Height: 4\' 11"  (149.9 cm) Weight: 82.4 kg (181 lb 10.5 oz) IBW/kg (Calculated) : 43.2 Heparin dosing weight 62.7  Vital Signs: Temp: 98.1 F (36.7 C) (02/27 0406) Temp Source: Oral (02/27 0406) BP: 127/89 (02/27 0406) Pulse Rate: 90 (02/27 0406)  Labs: Recent Labs    09/17/21 2115 09/17/21 2204 09/17/21 2335 09/18/21 0328 09/18/21 0528 09/18/21 2011 09/19/21 0311 09/19/21 1040 09/19/21 1722 09/20/21 0445  HGB 11.8*   < >  --   --  11.1*  --  11.4*  --   --  12.4  HCT 37.0   < >  --   --  34.4*  --  35.1*  --   --  39.0  PLT 394  --   --   --  354  --  339  --   --  388  APTT 31  --   --   --   --   --   --   --   --   --   LABPROT 17.5*  --   --  18.5*  --   --  21.9*  --   --   --   INR 1.4*  --   --  1.5*  --   --  1.9*  --   --   --   HEPARINUNFRC  --   --   --   --   --    < > 0.22* 0.60 0.92* 0.83*  CREATININE 0.91  --   --  0.86  --   --  0.91  --   --   --   TROPONINIHS 23*  --  17  --   --   --   --   --   --   --    < > = values in this interval not displayed.     Estimated Creatinine Clearance: 74.1 mL/min (by C-G formula based on SCr of 0.91 mg/dL).  Assessment: 46 yo F admitted with shortness of breath and CP.  Pt on warfarin PTA for afib.  INR  subtherapeutic (1.4) on admission.  Pt was initially restarted on warfarin but now switched to heparin with plans for cardiac cath.  CTA read negative for PE. Will follow up plans to resume warfarin after  cath.  INR up to 1.9 today, Hgb stable. Heparin level up to supratherapeutic (0.92) on infusion at 1350 units/hr. No issues with line or bleeding reported per RN.  2/27 AM update: Heparin level high but trending down  Goal of Therapy:  INR 2-3; Heparin level 0.3-0.7 units/ml Monitor platelets by anticoagulation protocol: Yes   Plan:  Dec heparin to 1050 units/hr 1300 heparin level  Narda Bonds, PharmD, BCPS Clinical Pharmacist Phone: [phone]

## 2021-09-20 NOTE — Progress Notes (Signed)
FPTS Brief Progress Note  S:Patient reports that she is having pain in her right arm after the procedure today. She also reports that her daughter wants her to change her code status to Full instead of DNR, she is okay with doing that at this time.    O: BP 109/63    Pulse 95    Temp 98.4 F (36.9 C) (Oral)    Resp 19    Ht 4\' 11"  (1.499 m)    Wt 82.4 kg    SpO2 97%    BMI 36.69 kg/m   General: appears tired, sitting up in bed, resting her right arm straight CV: RRR, no rubs or gallop Respiratory: CTAB, no increased WOB, breathing comfortably  A/P: Respiratory distress likely due to HFrEF exacerbation   nonischemic cardiomyopathy Cardiac cath with mild non-obstructive CAD. Cardiac MRI with mildly dilated LV with EF 23%, diffuse hypokinesis with inferior akinesis. - Orders reviewed. Labs for AM ordered, which was adjusted as needed.  - Continuing diuresis. - Cardiology following - Continue to monitor respiratory status - Pain medications currently ordered   Code status Discussed code status and patient would like to be switched to full code from DNR. - Full Code order placed  Sabir Charters, Lorrin Goodell, DO 09/20/2021, 9:16 PM PGY-2, [facility] Night Resident  Please page [phone] with questions.

## 2021-09-20 NOTE — Progress Notes (Signed)
Called to 3E-05 to assess the patients radial site due to pain proximal to the site. The TR band had been off for approximately an hour. No hematoma, or bleeding present. The patient did have pain that was increased upon palpation to the forearm above the site. Distal radial pulse present with adequate waveform. Advised the nurse to advise Dr. Aundra Dubin.

## 2021-09-20 NOTE — Progress Notes (Signed)
Heart Failure Navigator Progress Note  Assessed for Heart & Vascular TOC clinic readiness.  Patient does not meet criteria due to prior to hospitalization pt is established with [facility], Dr. Aundra Dubin. Pt had cath this admission--[facility] rounding team consulted.   Navigator available for reassessment of patient.   Pricilla Holm, MSN, RN Heart Failure Nurse Navigator [phone]

## 2021-09-20 NOTE — Progress Notes (Signed)
PT Cancellation Note  Patient Details Name: [name] MRN: [id number] DOB: 04/14/1976   Cancelled Treatment:    Reason Eval/Treat Not Completed: (P) Patient declined, no reason specified (pt reporting fatigue after procedures, defer mobility, agreeable to work with PT following day.)   Carlene Coria 09/20/2021, 5:58 PM

## 2021-09-20 NOTE — Progress Notes (Addendum)
Advanced Heart Failure Rounding Note  PCP-Cardiologist: Loralie Champagne, MD   Subjective:    Just returned from cath lab. No current dyspnea sitting upright but continues w/ orthopnea. TR band still in place. No complaints.   R/LHC today     Mid Cx lesion is 30% stenosed.   Prox RCA to Mid RCA lesion is 25% stenosed.   1. Mild, nonobstructive CAD. Nonischemic cardiomyopathy.  2. Elevated right and left heart filling pressures.  3. Preserved cardiac output.  4. PAPI low at 1.5.  5. Pulmonary venous hypertension.    Objective:   Weight Range: 82.4 kg Body mass index is 36.69 kg/m.   Vital Signs:   Temp:  [98.1 F (36.7 C)-98.2 F (36.8 C)] 98.1 F (36.7 C) (02/27 0406) Pulse Rate:  [73-91] 85 (02/27 0854) Resp:  [15-20] 17 (02/27 0854) BP: (86-135)/(60-99) 130/94 (02/27 0854) SpO2:  [95 %-100 %] 100 % (02/27 0854) Weight:  [82.4 kg-82.8 kg] 82.4 kg (02/27 0406) Last BM Date : 09/19/21 (per patient)  Weight change: Filed Weights   09/19/21 0456 09/19/21 1311 09/20/21 0406  Weight: 82.8 kg 82.8 kg 82.4 kg    Intake/Output:   Intake/Output Summary (Last 24 hours) at 09/20/2021 1013 Last data filed at 09/20/2021 0700 Gross per 24 hour  Intake 948.29 ml  Output 2350 ml  Net -1401.71 ml      Physical Exam    General:  chronically appearing female, looks older than actual age. No resp difficulty HEENT: Normal Neck: Supple. JVD 12 cm . Carotids 2+ bilat; no bruits. No lymphadenopathy or thyromegaly appreciated. Cor: PMI nondisplaced. Regular rate & rhythm. No rubs, gallops or murmurs. Lungs: decreased BS at the bases bilaterally  Abdomen: Soft, nontender, nondistended. No hepatosplenomegaly. No bruits or masses. Good bowel sounds. Extremities: No cyanosis, clubbing, rash, trace b/l LE edema + rt TR band  Neuro: Alert & orientedx3, cranial nerves grossly intact. moves all 4 extremities w/o difficulty. Affect pleasant   Telemetry   NSR 80s, personally  reviewed   EKG    Not performed today   Labs    CBC Recent Labs    09/17/21 2115 09/17/21 2204 09/19/21 0311 09/20/21 0445  WBC 14.5*   < > 11.2* 10.7*  NEUTROABS 10.9*  --   --   --   HGB 11.8*   < > 11.4* 12.4  HCT 37.0   < > 35.1* 39.0  MCV 76.1*   < > 74.2* 75.0*  PLT 394   < > 339 388   < > = values in this interval not displayed.   Basic Metabolic Panel Recent Labs    09/19/21 0311 09/20/21 0445  NA 136 137  K 3.7 4.8  CL 100 99  CO2 25 28  GLUCOSE 128* 133*  BUN 17 17  CREATININE 0.91 1.10*  CALCIUM 8.8* 8.7*  MG 1.9  --    Liver Function Tests Recent Labs    09/17/21 2115  AST 23  ALT 15  ALKPHOS 99  BILITOT 0.6  PROT 7.1  ALBUMIN 2.8*   No results for input(s): LIPASE, AMYLASE in the last 72 hours. Cardiac Enzymes No results for input(s): CKTOTAL, CKMB, CKMBINDEX, TROPONINI in the last 72 hours.  BNP: BNP (last 3 results) Recent Labs    11/24/20 1740 09/17/21 2115  BNP 47.5 304.0*    ProBNP (last 3 results) No results for input(s): PROBNP in the last 8760 hours.   D-Dimer No results for input(s): DDIMER in  the last 72 hours. Hemoglobin A1C No results for input(s): HGBA1C in the last 72 hours. Fasting Lipid Panel Recent Labs    09/18/21 0328  CHOL 222*  HDL 43  LDLCALC 160*  TRIG 97  CHOLHDL 5.2   Thyroid Function Tests No results for input(s): TSH, T4TOTAL, T3FREE, THYROIDAB in the last 72 hours.  Invalid input(s): FREET3  Other results:   Imaging    CARDIAC CATHETERIZATION  Result Date: 09/20/2021   Mid Cx lesion is 30% stenosed.   Prox RCA to Mid RCA lesion is 25% stenosed. 1. Mild, nonobstructive CAD. Nonischemic cardiomyopathy. 2. Elevated right and left heart filling pressures. 3. Preserved cardiac output. 4. PAPI low at 1.5. 5. Pulmonary venous hypertension.     Medications:     Scheduled Medications:  atorvastatin  40 mg Oral Daily   digoxin  0.125 mg Oral Daily   empagliflozin  25 mg Oral Daily    furosemide  80 mg Intravenous BID   gabapentin  100 mg Oral BID   insulin aspart  0-15 Units Subcutaneous TID WC   insulin glargine-yfgn  18 Units Subcutaneous Daily   sacubitril-valsartan  1 tablet Oral BID   sertraline  25 mg Oral Daily   sodium chloride flush  3 mL Intravenous Q12H   sodium chloride flush  3 mL Intravenous Q12H   spironolactone  25 mg Oral Daily    Infusions:  sodium chloride     heparin Stopped (09/20/21 0705)    PRN Medications: sodium chloride, acetaminophen, albuterol, hydrALAZINE, hydrOXYzine, labetalol, ondansetron (ZOFRAN) IV, oxyCODONE, sodium chloride flush    Patient Profile   46 y.o. 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   Echo: LVEF 20%, GIIIDD (restrictive), RV severely reduced. Mild MR/TR    Assessment/Plan   1.  Acute on Chronic Biventricular Heart Failure:  Nonischemic cardiomyopathy, diagnosed in 6/20. Repeat Echo 03/2019 showed EF 20-25%, diffuse hypokinesis, LV thrombus, moderate RV systolic dysfunction, mild MR, moderate TR. Echo in 3/21 with EF <30%, severe RV dysfunction.  She has a history of paroxysmal atrial fibrillation but seems to be mostly in sinus rhythm.  This is not likely to be a tachycardia-mediated cardiomyopathy.  Poorly controlled diabetes itself can cause a cardiomyopathy, as can poorly controlled HTN.  Myocarditis is a potential cause of her cardiomyopathy. [place] 9/20 showed nonobstructive CAD. RHC showed R>L heart failure with low PAPI suggesting significant RV dysfunction.  Echo in 6/21 with EF up to 35%. Echo 11/21 EF 35-40% Garde I DD. Now admitted w/ a/c CHF. NYHA Class III. Echo this admit, EF 20%, GIIIDD (restrictive), RV moderately reduced. Only mild MR and TR. R/LHC today showed mild,  nonobstructive, CAD, elevated right and left heart filling pressures and pulmonary venous hypertension w/ preserved cardiac output w/ Fick CI 2.72 w/ low PAPi score of 1.5. mPCWP 21 and RA 12.   - Increase Lasix to 80 mg IV bid  - Restart Entresto 49-51 mg bid (did not tolerate home dose 97-103 due to soft BP)  - Continue Spiro 25 mg daily  - Hold home hydralazine and Coreg for now w/ soft BPs  - Previously intolerant to Imdur due to HAs  - Continue Digoxin 0.125 mg. Dig level <0.2 on admit   - Continue empagliflozin 25 mg daily.   - Plan cMRI  -  She previously had been trying to get pregnant (has been difficult with polycystic ovarian syndrome).  She has 2 grown children already. With her cardiomyopathy and active CHF, pregnancy would create a significant mortality risk for her.  A number of her cardiac meds to control her CHF are feto-toxic. Discussed importance of strict regular birth control/ use of condoms to avoid pregnancy   - Not a candidate for advanced therapies including heart transplant and LVAD given poorly controlled DM and h/o poor compliance.    2. LV thrombus - LV thrombus noted on echo 9/20.   - Not noted on Echo this admit, but study not done w/ [facility]  - Given severe BiV failure/ HK, would continue coumadin indefinitely for secondary prevention  - INR 1.8 today  - INRs followed by [facility].    3. Atrial fibrillation, Paroxysmal.  - NSR on tele  - Continue Coumadin (using Coumadin rather than DOAC with h/o LV thrombus).  - Would consider atrial fibrillation ablation in the future, if recurrence, given CASTLE-HF data.   4. Mild Nonobstructive CAD - diffuse 25% p-mRCA, 30% mid LCx lession  - need aggressive risk factor modification  - continue statin w/ LDL goal < 70 - no ASA given chronic coumadin    5. Type 2 diabetes  - poorly controlled, most recent Hgb A1c 13.2 - Insulin per primary team  - c/w Jardiance. Monitor for GU infections    6.  HTN  - Controlled. Soft BPs early on admit  - GDMT per above    7. Situational Depression  - on Zoloft  - Denies SI/HI   - Previously offered referral to counseling but she declined.   8. SDOH - multiple psychosocial issues and h/o poor compliance - followed by HF SW team     Length of Stay: 2  Tammy Jester, PA-C  09/20/2021, 10:13 AM  Advanced Heart Failure Team Pager [phone] (M-F; 7a - 5p)  Please contact [facility] for night-coverage after hours (5p -7a ) and weekends on [email or web address]  Patient seen with PA, agree with the above note.   LHC/RHC today showed elevated filling pressures with RA pressure 12 and LVEDP 32.  Cardiac output preserved.  PAPI is low suggesting RV dysfunction.  Mild nonobstructive CAD.   She feels better but still with some dyspnea.   General: NAD Neck: JVP 12 cm, no thyromegaly or thyroid nodule.  Lungs: Clear to auscultation bilaterally with normal respiratory effort. CV: Lateral PMI.  Heart regular S1/S2, no S3/S4, no murmur.  No peripheral edema.   Abdomen: Soft, nontender, no hepatosplenomegaly, no distention.  Skin: Intact without lesions or rashes.  Neurologic: Alert and oriented x 3.  Psych: Normal affect. Extremities: No clubbing or cyanosis.  HEENT: Normal.  Patient needs ongoing diuresis.   - Lasix 80 mg IV bid . - BP stable, restart Entresto at 49/51 bid.  - Continue spironolactone 25 daily - Continue digoxin and Jardianc.  - When diuresed, restart Coreg.   Needs cardiac MRI to assess infiltrative disease.   INR 1.8, restart warfarin this evening.   Loralie Champagne 09/20/2021 2:17 PM

## 2021-09-20 NOTE — Progress Notes (Signed)
Family Medicine Teaching Service Daily Progress Note Intern Pager: [phone]  Patient name: [name] Medical record number: [id number] Date of birth: 03-07-1976 Age: 46 y.o. Gender: female  Primary Care Provider: Gifford Shave, MD Consultants: Cardiology, heart failure team Code Status: Full   Pt Overview and Major Events to Date:  2/25-admitted 2/27-cardiac cath  Assessment and Plan: Patient is a 46 year old female who presented with increased work of breathing.  Past medical history significant for T2DM, HFrEF, PAF, PCOS, HTN, OSA, HLD, anxiety, obesity and asthma  Respiratory distress likely due to HFrEF exacerbation   nonischemic cardiomyopathy Total 24-hour UOP of 3.3 s/p 80 mg IV Lasix x2 yesterday.  Patient is on RA with normal work of breathing and lungs clear to auscultation.  Patient cardiac cath yesterday to rule out obstructive CAD.  Heart failure team in onboard.  -Continue digoxin 0.125 mg -Continue spironolactone 12.5 mg -Continue Jardiance 25 mg -Entresto 49-51 mg twice daily -IV Lasix 80 mg once today -Monitor respiratory status -Continue prednisone 40 mg daily -Strict I's and O's -Daily weights  Mild Nonobstructive CAD Cardiac cath showed mild nonobstructive CAD (Cx lesion 30% stenosis, RCA 25% stenosis) and pulm. Htn -continue Lipitor 40 mg daily -No ASA, on Coumadin  PAF   history left-ventricular thrombus Patient transitioned from heparin gtt. to Coumadin today continue Coumadin rather than DOAC due to history of LV thrombus -Pharmacy consulted to dose warfarin  HTN BP 101/69 today. -Continue spironolactone 25 mg daily -continuing to hold coreg d/t soft BPs  T2DM Most recent A1c of 13.2.  CBG this morning of 215 -Semglee 18 units -SSI -Continue Jardiance 25 mg daily  HLD -continue lipitor 40 mg daily  Anxiety -Continue Zoloft 25 mg daily -Continue Atarax 10mg  TID prn  FEN/GI: Salt restricted diet PPx: On warfarin Dispo: Plan for  home tomorrow  Subjective:  Patient sitting up in bed, irritated because labs are being drawn again.  States she is tired of being poked but will allow it this morning.  States her chest pain is significantly better today as well as her shortness of breath.  Objective: Temp:  [98.1 F (36.7 C)-98.2 F (36.8 C)] 98.1 F (36.7 C) (02/27 0406) Pulse Rate:  [82-91] 85 (02/27 0854) Resp:  [15-20] 17 (02/27 0854) BP: (109-135)/(76-99) 130/94 (02/27 0854) SpO2:  [95 %-100 %] 100 % (02/27 0854) Weight:  [82.4 kg] 82.4 kg (02/27 0406) Physical Exam: General: Sitting up in bed, NAD Cardiovascular: RRR, normal S1/S2 Respiratory: CTAB, normal work of breathing Extremities: No edema of BLEs  Laboratory: Recent Labs  Lab 09/18/21 0528 09/19/21 0311 09/20/21 0445 09/20/21 0840  WBC 13.6* 11.2* 10.7*  --   HGB 11.1* 11.4* 12.4 12.6   12.9  HCT 34.4* 35.1* 39.0 37.0   38.0  PLT 354 339 388  --    Recent Labs  Lab 09/17/21 2115 09/17/21 2204 09/18/21 0328 09/19/21 0311 09/20/21 0445 09/20/21 0840  NA 133*   < > 133* 136 137 140   139  K 3.9   < > 4.0 3.7 4.8 3.6   3.6  CL 104  --  101 100 99  --   CO2 20*  --  20* 25 28  --   BUN 10  --  9 17 17   --   CREATININE 0.91  --  0.86 0.91 1.10*  --   CALCIUM 8.8*  --  8.8* 8.8* 8.7*  --   PROT 7.1  --   --   --   --   --  BILITOT 0.6  --   --   --   --   --   ALKPHOS 99  --   --   --   --   --   ALT 15  --   --   --   --   --   AST 23  --   --   --   --   --   GLUCOSE 291*  --  308* 128* 133*  --    < > = values in this interval not displayed.    Precious Gilding, DO 09/20/2021, 4:53 PM PGY-1, [place] Intern pager: [phone], text pages welcome

## 2021-09-20 NOTE — Interval H&P Note (Signed)
History and Physical Interval Note:  09/20/2021 8:27 AM  [name]  has presented today for surgery, with the diagnosis of unstable angina.  The various methods of treatment have been discussed with the patient and family. After consideration of risks, benefits and other options for treatment, the patient has consented to  Procedure(s): RIGHT/LEFT HEART CATH AND CORONARY ANGIOGRAPHY (N/A) as a surgical intervention.  The patient's history has been reviewed, patient examined, no change in status, stable for surgery.  I have reviewed the patient's chart and labs.  Questions were answered to the patient's satisfaction.     [name] [employer]

## 2021-09-20 NOTE — Progress Notes (Signed)
Family Medicine Teaching Service Daily Progress Note Intern Pager: [phone]  Patient name: [name] Medical record number: [id number] Date of birth: 1976/04/29 Age: 46 y.o. Gender: female  Primary Care Provider: Gifford Shave, MD Consultants: None Code Status: DNR  Pt Overview and Major Events to Date:  2/25-admitted  Assessment and Plan: Patient is a 46 year old female presenting with increased work of breathing.  Patient PMH significant for T2DM, HFrEF, PAF, PCOS, hypertension, OSA, HLD, anxiety, obesity, and asthma  Respiratory distress likely due to HFrEF exacerbation   nonischemic cardiomyopathy Echocardiogram yesterday showed EF of 20% (was previously 35 to 40% in 2021).  Total 24-hour UOP of 3.1 L s/p 40 mg IV Lasix x2.  Weight 82.8> 82.4 kg yesterday.  Patient still on 2 L O2 for comfort, she has normal work of breathing and lungs are clear to auscultation.   Cardiology and heart failure team on board.  Cardiology plans for cath today to rule out obstructive CAD due to sudden change in symptoms. -Cardiology and heart failure team following -Continue Coreg 12.5 mg twice daily -Continue digoxin 0.125 mg -Continue spironolactone 12.5 mg -Continue Jardiance 25 mg -IV Lasix 80 mg twice daily -Monitor respiratory status -Continue prednisone 40 mg daily -Strict I's and O's -Daily weights  PAF   history of left ventricular thrombus INR goal of 2-3, INR today of 1.8 -Heparin GTT -Monitor INR  Chest pain/pressure Ischemic work-up negative.  Patient had 2 doses of oxycodone 2.5 mg yesterday.  Chest pain seems to be musculoskeletal in nature as it is worse with palpation.  Rates her pain at 6/10 today. -CTM -Tylenol 650 mg every 6 hours. -Oxycodone 2.5 mg every 6 hours as needed  HTN Carvedilol D/C yesterday due to low pressures. BP this morning of 127/89 -Continue  spironolactone 25 mg daily -Continue to hold home hydralazine  T2DM CBG 117 this morning -Semglee 18  units -SSI  HLD -Continue atorvastatin 40 mg daily  Anxiety -Continue Zoloft 25 mg daily -Continue Atarax 10 mg 3 times daily   FEN/GI: Regular diet PPx: Heparin Dispo: Pending clinical improvement, completion of cardiac cath  Subjective:  Patient states she is still having increased work of breathing comparable to when she was admitted.  She rates her chest pain at a 6/10, previously states it was an 8 or 9/10.  Objective: Temp:  [98.1 F (36.7 C)-98.2 F (36.8 C)] 98.1 F (36.7 C) (02/27 0406) Pulse Rate:  [73-90] 90 (02/27 0406) Resp:  [16-20] 20 (02/27 0406) BP: (86-127)/(60-89) 127/89 (02/27 0406) SpO2:  [95 %-100 %] 97 % (02/27 0406) Weight:  [82.4 kg-82.8 kg] 82.4 kg (02/27 0406) Physical Exam: General: Patient sitting up in bed, NAD Cardiovascular: RRR, normal S1/S2 Respiratory: CTAB, normal work of breathing on 2 L O2 Extremities: No edema of BLEs  Laboratory: Recent Labs  Lab 09/18/21 0528 09/19/21 0311 09/20/21 0445  WBC 13.6* 11.2* 10.7*  HGB 11.1* 11.4* 12.4  HCT 34.4* 35.1* 39.0  PLT 354 339 388   Recent Labs  Lab 09/17/21 2115 09/17/21 2204 09/18/21 0328 09/19/21 0311 09/20/21 0445  NA 133*   < > 133* 136 137  K 3.9   < > 4.0 3.7 4.8  CL 104  --  101 100 99  CO2 20*  --  20* 25 28  BUN 10  --  9 17 17   CREATININE 0.91  --  0.86 0.91 1.10*  CALCIUM 8.8*  --  8.8* 8.8* 8.7*  PROT 7.1  --   --   --   --  BILITOT 0.6  --   --   --   --   ALKPHOS 99  --   --   --   --   ALT 15  --   --   --   --   AST 23  --   --   --   --   GLUCOSE 291*  --  308* 128* 133*   < > = values in this interval not displayed.     Precious Gilding, DO 09/20/2021, 7:24 AM PGY-1, [place] Intern pager: [phone], text pages welcome

## 2021-09-21 DIAGNOSIS — Z794 Long term (current) use of insulin: Secondary | ICD-10-CM | POA: Diagnosis not present

## 2021-09-21 DIAGNOSIS — E119 Type 2 diabetes mellitus without complications: Secondary | ICD-10-CM | POA: Diagnosis not present

## 2021-09-21 DIAGNOSIS — I480 Paroxysmal atrial fibrillation: Secondary | ICD-10-CM | POA: Diagnosis not present

## 2021-09-21 DIAGNOSIS — I2510 Atherosclerotic heart disease of native coronary artery without angina pectoris: Secondary | ICD-10-CM | POA: Diagnosis not present

## 2021-09-21 DIAGNOSIS — R079 Chest pain, unspecified: Secondary | ICD-10-CM | POA: Diagnosis not present

## 2021-09-21 DIAGNOSIS — I5023 Acute on chronic systolic (congestive) heart failure: Secondary | ICD-10-CM | POA: Diagnosis not present

## 2021-09-21 DIAGNOSIS — G4733 Obstructive sleep apnea (adult) (pediatric): Secondary | ICD-10-CM | POA: Diagnosis not present

## 2021-09-21 LAB — CBC
HCT: 41.2 % (ref 36.0–46.0)
Hemoglobin: 13.2 g/dL (ref 12.0–15.0)
MCH: 23.9 pg — ABNORMAL LOW (ref 26.0–34.0)
MCHC: 32 g/dL (ref 30.0–36.0)
MCV: 74.6 fL — ABNORMAL LOW (ref 80.0–100.0)
Platelets: 385 10*3/uL (ref 150–400)
RBC: 5.52 MIL/uL — ABNORMAL HIGH (ref 3.87–5.11)
RDW: 16.1 % — ABNORMAL HIGH (ref 11.5–15.5)
WBC: 9.8 10*3/uL (ref 4.0–10.5)
nRBC: 0 % (ref 0.0–0.2)

## 2021-09-21 LAB — BASIC METABOLIC PANEL
Anion gap: 12 (ref 5–15)
BUN: 20 mg/dL (ref 6–20)
CO2: 28 mmol/L (ref 22–32)
Calcium: 9 mg/dL (ref 8.9–10.3)
Chloride: 96 mmol/L — ABNORMAL LOW (ref 98–111)
Creatinine, Ser: 1.14 mg/dL — ABNORMAL HIGH (ref 0.44–1.00)
GFR, Estimated: >60 mL/min (ref >60)
Glucose, Bld: 211 mg/dL — ABNORMAL HIGH (ref 70–99)
Potassium: 3.9 mmol/L (ref 3.5–5.1)
Sodium: 136 mmol/L (ref 135–145)

## 2021-09-21 LAB — GLUCOSE, CAPILLARY
Glucose-Capillary: 215 mg/dL — ABNORMAL HIGH (ref 70–99)
Glucose-Capillary: 194 mg/dL — ABNORMAL HIGH (ref 70–99)
Glucose-Capillary: 251 mg/dL — ABNORMAL HIGH (ref 70–99)
Glucose-Capillary: 258 mg/dL — ABNORMAL HIGH (ref 70–99)

## 2021-09-21 LAB — PROTIME-INR
INR: 1.7 — ABNORMAL HIGH (ref 0.8–1.2)
Prothrombin Time: 20.2 seconds — ABNORMAL HIGH (ref 11.4–15.2)

## 2021-09-21 LAB — MAGNESIUM: Magnesium: 2.2 mg/dL (ref 1.7–2.4)

## 2021-09-21 MED ORDER — WARFARIN SODIUM 7.5 MG PO TABS
15.0000 mg | ORAL_TABLET | Freq: Once | ORAL | Status: AC
  Administered 2021-09-21: 15 mg via ORAL
  Filled 2021-09-21: qty 2

## 2021-09-21 MED ORDER — LIVING WELL WITH DIABETES BOOK
Freq: Once | Status: AC
  Filled 2021-09-21: qty 1

## 2021-09-21 NOTE — Progress Notes (Signed)
Mobility Specialist Progress Note:   09/21/21 1430  Mobility  Activity Ambulated with assistance in room  Level of Assistance Standby assist, set-up cues, supervision of patient - no hands on  Assistive Device None  Distance Ambulated (ft) 85 ft  Activity Response Tolerated fair  $Mobility charge 1 Mobility   Pt required encouragement to participate in mobility session. No physical assistance required. Pt with nausea during session, otherwise asx. Pt with slow, steady gait throughout. Left sitting EOB with all needs met.   [place] Numbers [facility] Phone: [phone] Office Phone: [phone]

## 2021-09-21 NOTE — Plan of Care (Signed)
  Problem: Activity: Goal: Capacity to carry out activities will improve Outcome: Progressing   Problem: Cardiac: Goal: Ability to achieve and maintain adequate cardiopulmonary perfusion will improve Outcome: Progressing   

## 2021-09-21 NOTE — TOC Initial Note (Addendum)
Transition of Care [facility]) - Initial/Assessment Note    Patient Details  Name: [name] MRN: [id number] Date of Birth: 15-Aug-1975  [facility] of [facility] CM/SW Contact:    Erenest Rasher, RN Phone Number: [phone] 09/21/2021, 5:49 PM  Clinical Narrative:                 HF TOC CM spoke to pt at bedside. States she has scale. CM educated the importance of daily weights and monitoring diet and sodium intake. Pt reports she uses [facility] or drives to appts. Pt reports she being able to afford meds. Will request meds come up from [place] at dc.   Expected Discharge Plan: Home/Self Care Barriers to Discharge: Continued Medical Work up   Patient Goals and CMS Choice Patient states their goals for this hospitalization and ongoing recovery are:: wants to remain independent at home CMS Medicare.gov Compare Post Acute Care list provided to:: Patient    Expected Discharge Plan and Services Expected Discharge Plan: Home/Self Care   Discharge Planning Services: CM Consult   Living arrangements for the past 2 months: Apartment                                      Prior Living Arrangements/Services Living arrangements for the past 2 months: Apartment Lives with:: Self   Do you feel safe going back to the place where you live?: Yes      Need for Family Participation in Patient Care: No (Comment) Care giver support system in place?: No (comment)   Criminal Activity/Legal Involvement Pertinent to Current Situation/Hospitalization: No - Comment as needed  Activities of Daily Living Home Assistive Devices/Equipment: None ADL Screening (condition at time of admission) Patient's cognitive ability adequate to safely complete daily activities?: Yes Is the patient deaf or have difficulty hearing?: No Does the patient have difficulty seeing, even when wearing glasses/contacts?: No Does the patient have difficulty concentrating, remembering, or making decisions?:  No Patient able to express need for assistance with ADLs?: Yes Does the patient have difficulty dressing or bathing?: No Independently performs ADLs?: Yes (appropriate for developmental age) Does the patient have difficulty walking or climbing stairs?: Yes (SOB) Weakness of Legs: None Weakness of Arms/Hands: None  Permission Sought/Granted Permission sought to share information with : Case Manager, Family Supports, PCP Permission granted to share information with : Yes, Verbal Permission Granted  Share Information with NAME: Idamae Coccia        Permission granted to share info w Contact Information: [phone]  Emotional Assessment Appearance:: Appears stated age Attitude/Demeanor/Rapport: Engaged Affect (typically observed): Accepting Orientation: : Oriented to Self, Oriented to  Time, Oriented to Place, Oriented to Situation   Psych Involvement: No (comment)  Admission diagnosis:  Acute heart failure ([place]) [I50.9] Chest pain, unspecified type [R07.9] Acute on chronic congestive heart failure, unspecified heart failure type [facility]) [I50.9] Patient Active Problem List   Diagnosis Date Noted   DKA (diabetic ketoacidosis) ([place]) 09/04/2020   Hypokalemia 10/13/2019   Hyponatremia 10/13/2019   Encounter for therapeutic drug monitoring 10/07/2019   Acute on chronic congestive heart failure ([place])    Hypertensive urgency 09/30/2019   Elevated troponin 09/30/2019   Hypertensive emergency 09/30/2019   Chest pain 03/27/2019   Attempting to conceive 03/10/2019   Acute on chronic systolic CHF (congestive heart failure) ([place]) 02/19/2019   Asthma    Yeast infection 02/06/2019  Right ventricular failure ([place]) 02/06/2019   Morbid obesity ([place]) 02/06/2019   HFrEF (heart failure with reduced ejection fraction) ([place]) 12/31/2018   Paroxysmal atrial fibrillation ([place]) 12/31/2018   Anxiety 12/31/2018   Hyperlipidemia associated with type 2 diabetes mellitus ([place]) 10/30/2017   Obstructive  sleep apnea 06/30/2015   Anemia due to blood loss, chronic 06/27/2012   Allergic rhinitis 06/27/2012   Major depressive disorder 02/08/2012   Hypertension associated with diabetes ([place]) 12/04/2011   Dysmenorrhea 11/22/2011   Vaginal yeast infection 05/19/2011   Diabetes mellitus type 2, insulin dependent ([place]) 05/19/2011   PCOS (polycystic ovarian syndrome) 05/19/2011   Hydrosalpinx 08/21/2009   PCP:  Gifford Shave, MD Pharmacy:   [facility] [phone] [place], [place] [address] [place] [address] Phone: [phone] Fax: [phone]  [facility], [place] [address] Phone: [phone] Fax: [place], [place] [address] Phone: [phone] Fax: [phone]  [place] [facility] [address] [place] [address] Phone: [phone] Fax: [place], [place] [place] [address] Phone: [phone] Fax: [place] [address] [place] [address] Phone: [phone] Fax: [phone]     Social Determinants of Health (SDOH) Interventions    Readmission Risk Interventions Readmission Risk Prevention Plan 10/01/2019 04/02/2019  Transportation Screening Complete Complete  PCP or Specialist Appt within 3-5 Days Complete Complete  HRI or [place] Complete Complete  Social Work Consult for [place] Planning/Counseling Complete Complete  Palliative Care Screening Not Applicable Not Applicable  Medication Review Press photographer) - Complete  Some recent data might be hidden

## 2021-09-21 NOTE — Progress Notes (Addendum)
Patient ID: [name], female   DOB: [birth year], 46 y.o.   MRN: [id number]     Advanced Heart Failure Rounding Note  PCP-Cardiologist: Loralie Champagne, MD   Subjective:    No complaints this morning.  Good diuresis overnight, weight down 4 lbs.   R/LHC 2/28:     Mid Cx lesion is 30% stenosed.   Prox RCA to Mid RCA lesion is 25% stenosed.   1. Mild, nonobstructive CAD. Nonischemic cardiomyopathy.  2. Elevated right and left heart filling pressures.  3. Preserved cardiac output.  4. PAPI low at 1.5.  5. Pulmonary venous hypertension.   Cardiac MRI (2/28):  1. Mildly dilated LV with LV EF 23%, diffuse hypokinesis with inferior akinesis. 2.  Normal RV size with moderate systolic dysfunction, EF 02%. 3. No myocardial LGE so no definitive evidence for prior MI, infiltrative disease, or myocarditis.   Objective:   Weight Range: 80.3 kg Body mass index is 35.77 kg/m.   Vital Signs:   Temp:  [97.7 F (36.5 C)-98.6 F (37 C)] 97.7 F (36.5 C) (02/28 0516) Pulse Rate:  [82-95] 95 (02/27 1930) Resp:  [15-19] 16 (02/28 0516) BP: (101-130)/(63-94) 101/69 (02/28 0516) SpO2:  [97 %-100 %] 98 % (02/28 0516) Weight:  [80.3 kg] 80.3 kg (02/28 0516) Last BM Date : 09/19/21  Weight change: Filed Weights   09/19/21 1311 09/20/21 0406 09/21/21 0516  Weight: 82.8 kg 82.4 kg 80.3 kg    Intake/Output:   Intake/Output Summary (Last 24 hours) at 09/21/2021 0838 Last data filed at 09/21/2021 0516 Gross per 24 hour  Intake 367.72 ml  Output 3300 ml  Net -2932.28 ml      Physical Exam    General: NAD Neck: No JVD, no thyromegaly or thyroid nodule.  Lungs: Clear to auscultation bilaterally with normal respiratory effort. CV: Lateral PMI.  Heart regular S1/S2, no S3/S4, no murmur.  No peripheral edema.  N Abdomen: Soft, nontender, no hepatosplenomegaly, no distention.  Skin: Intact without lesions or rashes.  Neurologic: Alert and oriented x 3.  Psych: Normal  affect. Extremities: No clubbing or cyanosis.  HEENT: Normal.    Telemetry   NSR 80s, personally reviewed   EKG    Not performed today   Labs    CBC Recent Labs    09/20/21 0445 09/20/21 0840 09/21/21 0602  WBC 10.7*  --  9.8  HGB 12.4 12.6   12.9 13.2  HCT 39.0 37.0   38.0 41.2  MCV 75.0*  --  74.6*  PLT 388  --  725   Basic Metabolic Panel Recent Labs    09/19/21 0311 09/20/21 0445 09/20/21 0840 09/21/21 0602  NA 136 137 140   139 136  K 3.7 4.8 3.6   3.6 3.9  CL 100 99  --  96*  CO2 25 28  --  28  GLUCOSE 128* 133*  --  211*  BUN 17 17  --  20  CREATININE 0.91 1.10*  --  1.14*  CALCIUM 8.8* 8.7*  --  9.0  MG 1.9  --   --  2.2   Liver Function Tests No results for input(s): AST, ALT, ALKPHOS, BILITOT, PROT, ALBUMIN in the last 72 hours.  No results for input(s): LIPASE, AMYLASE in the last 72 hours. Cardiac Enzymes No results for input(s): CKTOTAL, CKMB, CKMBINDEX, TROPONINI in the last 72 hours.  BNP: BNP (last 3 results) Recent Labs    11/24/20 1740 09/17/21 2115  BNP 47.5 304.0*  ProBNP (last 3 results) No results for input(s): PROBNP in the last 8760 hours.   D-Dimer No results for input(s): DDIMER in the last 72 hours. Hemoglobin A1C No results for input(s): HGBA1C in the last 72 hours. Fasting Lipid Panel No results for input(s): CHOL, HDL, LDLCALC, TRIG, CHOLHDL, LDLDIRECT in the last 72 hours.  Thyroid Function Tests No results for input(s): TSH, T4TOTAL, T3FREE, THYROIDAB in the last 72 hours.  Invalid input(s): FREET3  Other results:   Imaging    MR CARDIAC MORPHOLOGY W WO CONTRAST  Result Date: 09/20/2021 CLINICAL DATA:  Nonischemic cardiomyopathy EXAM: CARDIAC MRI TECHNIQUE: The patient was scanned on a 1.5 Tesla GE magnet. A dedicated cardiac coil was used. Functional imaging was done using Fiesta sequences. 2,3, and 4 chamber views were done to assess for RWMA's. Modified Simpson's rule using a short axis stack was  used to calculate an ejection fraction on a dedicated work Conservation officer, nature. The patient received 10 cc of Gadavist. After 10 minutes inversion recovery sequences were used to assess for infiltration and scar tissue. CONTRAST:  Gadavist 10 cc FINDINGS: Limited images of the lung fields showed no gross abnormalities. Mildly dilated left ventricle with normal wall thickness. Diffuse hypokinesis with inferior akinesis, EF 23%. Normal right ventricular size with moderate systolic dysfunction, EF 43%. Normal right and left atrial sizes. No significant mitral regurgitation. Trileaflet aortic valve, no significant regurgitation or stenosis. On delayed enhancement imaging, there was no myocardial late gadolinium enhancement (LGE). MEASUREMENTS: MEASUREMENTS LVEDV 203 mL LVSV 46 mL LVEF 23% RVEDV 125 mL RVSV 40 mL RVEF 32% T1 1020, ECV 25% IMPRESSION: 1. Mildly dilated LV with LV EF 23%, diffuse hypokinesis with inferior akinesis. 2.  Normal RV size with moderate systolic dysfunction, EF 32%. 3. No myocardial LGE so no definitive evidence for prior MI, infiltrative disease, or myocarditis. Andrey Hoobler Electronically Signed   By: Loralie Champagne M.D.   On: 09/20/2021 16:42     Medications:     Scheduled Medications:  atorvastatin  40 mg Oral Daily   digoxin  0.125 mg Oral Daily   empagliflozin  25 mg Oral Daily   furosemide  80 mg Intravenous BID   gabapentin  100 mg Oral BID   insulin aspart  0-15 Units Subcutaneous TID WC   insulin glargine-yfgn  18 Units Subcutaneous Daily   sacubitril-valsartan  1 tablet Oral BID   sertraline  25 mg Oral Daily   sodium chloride flush  3 mL Intravenous Q12H   sodium chloride flush  3 mL Intravenous Q12H   spironolactone  25 mg Oral Daily   Warfarin - Pharmacist Dosing Inpatient   Does not apply q1600    Infusions:  sodium chloride      PRN Medications: sodium chloride, acetaminophen, albuterol, hydrOXYzine, ondansetron (ZOFRAN) IV, oxyCODONE, sodium  chloride flush    Patient Profile   46 y.o. 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   Echo: LVEF 20%, GIIIDD (restrictive), RV severely reduced. Mild MR/TR    Assessment/Plan   1.  Acute on Chronic Biventricular Heart Failure:  Nonischemic cardiomyopathy, diagnosed in 6/20. Repeat Echo 03/2019 showed EF 20-25%, diffuse hypokinesis, LV thrombus, moderate RV systolic dysfunction, mild MR, moderate TR. Echo in 3/21 with EF <30%, severe RV dysfunction.  She has a history of  paroxysmal atrial fibrillation but seems to be mostly in sinus rhythm.  This is not likely to be a tachycardia-mediated cardiomyopathy.  Poorly controlled diabetes itself can cause a cardiomyopathy, as can poorly controlled HTN.  Myocarditis is a potential cause of her cardiomyopathy. [place]ella 9/20 showed nonobstructive CAD. RHC showed R>L heart failure with low PAPI suggesting significant RV dysfunction.  Echo in 6/21 with EF up to 35%. Echo 11/21 EF 35-40% Garde I DD. Now admitted w/ a/c CHF. NYHA Class III. Echo this admit, EF 20%, GIIIDD (restrictive), RV moderately reduced. Only mild MR and TR. R/LHC today showed mild, nonobstructive, CAD, elevated right and left heart filling pressures and pulmonary venous hypertension w/ preserved cardiac output w/ Fick CI 2.72 w/ low PAPi score of 1.5. mPCWP 21 and RA 12.  Cardiac MRI with LV EF 23%, no LGE. Good diuresis overnight, weight down 4 lbs.  On exam, looks close to euvolemic.  - Lasix 80 mg IV x 1 today, then back to po tomorrow.   - Continue Entresto 49-51 mg bid (did not tolerate home dose 97-103 due to soft BP)  - Continue Spiro 25 mg daily  - Can restart Coreg tomorrow at low dose if BP stable.  - Continue Digoxin 0.125 mg. Dig level <0.2 on  admit   - Continue empagliflozin 25 mg daily.   - She previously had been trying to get pregnant (has been difficult with polycystic ovarian syndrome).  She has 2 grown children already. With her cardiomyopathy and active CHF, pregnancy would create a significant mortality risk for her.  A number of her cardiac meds to control her CHF are feto-toxic. Discussed importance of strict regular birth control/ use of condoms to avoid pregnancy   - Would need better compliance and blood glucose control (A1c 14 recently) for advanced therapies including heart transplant and LVAD.  2. 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  Not seen on cMRI this admission.  - Given severe BiV failure/ HK, would continue coumadin indefinitely for secondary prevention, INR 1.7 today.  3. Atrial fibrillation, Paroxysmal: NSR here. - Continue Coumadin (using Coumadin rather than DOAC with h/o LV thrombus).  - Would consider atrial fibrillation ablation in the future, if recurrence, given CASTLE-HF data.  4. Mild Nonobstructive CAD: Diffuse 25% p-mRCA, 30% mid LCx lession  - continue statin w/ LDL goal < 70 - no ASA given chronic coumadin  5. Type 2 diabetes: Poorly controlled. - Insulin per primary team  - c/w Jardiance. Monitor for GU infections  6. HTN: Not elevated.   7. Depression  - on Zoloft  8. SDOH - multiple psychosocial issues and h/o poor compliance - followed by HF SW team   Likely home tomorrow.     Length of Stay: 3  Loralie Champagne, MD  09/21/2021, 8:38 AM  Advanced Heart Failure Team Pager [phone] (M-F; 7a - 5p)  Please contact [facility] for night-coverage after hours (5p -7a ) and weekends on [email or web address]

## 2021-09-21 NOTE — Progress Notes (Addendum)
[place] for Warfarin Indication: atrial fibrillation and h/o LV thrombus   Allergies  Allergen Reactions   Shellfish Allergy Swelling    Airway involvement including EMS - Has Epi-Pen   Dulaglutide Nausea And Vomiting and Other (See Comments)    Multiple episodes of vomiting over multiple days   Metformin And Related Diarrhea   Ibuprofen Other (See Comments)    Per PCP interferes with daily meds (heart failure)    Patient Measurements: Height: 4\' 11"  (149.9 cm) Weight: 80.3 kg (177 lb 1.6 oz) IBW/kg (Calculated) : 43.2  Heparin Dosing Weight: 63 kg  Vital Signs: Temp: 97.7 F (36.5 C) (02/28 0516) Temp Source: Oral (02/28 0516) BP: 101/69 (02/28 0516)  Labs: Recent Labs    09/19/21 0311 09/19/21 1040 09/19/21 1722 09/20/21 0445 09/20/21 0840 09/21/21 0602  HGB 11.4*  --   --  12.4 12.6   12.9 13.2  HCT 35.1*  --   --  39.0 37.0   38.0 41.2  PLT 339  --   --  388  --  385  LABPROT 21.9*  --   --  20.9*  --   --   INR 1.9*  --   --  1.8*  --   --   HEPARINUNFRC 0.22* 0.60 0.92* 0.83*  --   --   CREATININE 0.91  --   --  1.10*  --  1.14*     Estimated Creatinine Clearance: 58.3 mL/min (A) (by C-G formula based on SCr of 1.14 mg/dL (H)).   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 INR subtherapeutic at 1.4 on admission. Pt was initially restarted on warfarin but transitioned to heparin with plans for cardiac cath 2/27. Pharmacy consulted to continue warfarin. CBC is stable. No bleeding noted.  Anticoagulation 09/03/21 Warfarin Regimen:   2.0-3.0  TTR:  38.5 % (1.9 y)  INR used for dosing:  2.2 (09/03/2021)  Warfarin maintenance plan:  11.25 mg (7.5 mg x 1.5) every day  Weekly warfarin total:  78.75 mg   INR trending down to 1.7 this morning after resuming home regimen 2/27. Regular diet also  resumed on 2/27. Plan to give larger dose today and expect to resume home regimen afterwards.  Goal of Therapy:  INR 2-3 Heparin level 0.3-0.7 units/ml Monitor platelets by anticoagulation protocol: Yes   Plan:  Give warfarin 15 mg PO x1 dose. Would recommend resuming PTA regimen at discharge. Check INR daily while on warfarin Continue to monitor H&H and platelets   Thank you for allowing pharmacy to participate in this patient's care.  Reatha Harps, PharmD PGY1 Pharmacy Resident 09/21/2021 8:40 AM Check [email or web address] for unit specific pharmacy number

## 2021-09-21 NOTE — Progress Notes (Signed)
Inpatient Diabetes Program Recommendations  AACE/ADA: New Consensus Statement on Inpatient Glycemic Control (2015)  Target Ranges:  Prepandial:   less than 140 mg/dL      Peak postprandial:   less than 180 mg/dL (1-2 hours)      Critically ill patients:  140 - 180 mg/dL   Lab Results  Component Value Date   GLUCAP 215 (H) 09/21/2021   HGBA1C 13.2 (A) 08/31/2021    Review of Glycemic Control  Latest Reference Range & Units 09/20/21 06:01 09/20/21 12:07 09/20/21 16:24 09/20/21 21:24 09/21/21 05:59  Glucose-Capillary 70 - 99 mg/dL 117 (H) 212 (H) 111 (H) 267 (H) 215 (H)  (H): Data is abnormally high  Diabetes history: DM2 Outpatient Diabetes medications: Lantus 36 units qd (started 2 weeks ago), Novolog 12 units ac supper Current orders for Inpatient glycemic control: Semglee 18 units qd, Novolog 0-15 units tid, Jardiance 25 units qd  A1c 13.2 on 08/31/21  Inpatient Diabetes Program Recommendations:   Please consider: -Change diet to carb modified -Decrease Novolog correction to 0-9 units tid + hs 0-5 units -Increase Semglee to 20 units qd -Add Novolog meal coverage 3 units tid if eats 50% meal  Thank you, Bethena Roys E. Keta [place]val[place], RN, MSN, CDE  Diabetes Coordinator Inpatient Glycemic Control Team Team Pager [phone] (8am-5pm) 09/21/2021 9:38 AM

## 2021-09-21 NOTE — Plan of Care (Signed)
?  Problem: Activity: ?Goal: Capacity to carry out activities will improve ?Outcome: Progressing ?  ?

## 2021-09-21 NOTE — Progress Notes (Signed)
FPTS Brief Progress Note  S: Pt was sleeping in bed, did not wake the patient    O: BP 120/67    Pulse 96    Temp 98 F (36.7 C) (Oral)    Resp 19    Ht 4\' 11"  (1.499 m)    Wt 80.3 kg    SpO2 96%    BMI 35.77 kg/m   General: Sleeping in bed  Respiratory: No respiratory distress    A/P: HFrEF - continued diuresis  - transition to PO Lasix 3/1   - likely d/c 3/1  Erskine Emery, MD 09/21/2021, 10:56 PM PGY-1, [facility] Night Resident  Please page [phone] with questions.

## 2021-09-21 NOTE — Progress Notes (Signed)
Physical Therapy Treatment Patient Details Name: [name] MRN: [id number] DOB: [birth year] Today's Date: 09/21/2021   History of Present Illness [name] is a 46 y.o. female who presented after 4 days of worsening breathing and dyspnea wtih exertion. 2/28: L/R cardiac cath performed with mild non-obstructive CAD. Cardiac MRI with mildly dilated LV with EF 23%, diffuse hypokinesis with inferior akinesis.PMH: non-obstructive CAD on cath in 03/2019, non-ischemic cardiomyopathy/ chronic combined CHF with EF as low as <20% in the past but 35-40% on last Echo in 05/2020, LV thrombus on Echo in 03/2019 on Coumadin, paroxysmal atrial fibrillation on Coumadin, hypertension, hyperlipidemia, poorly controlled type 2 diabetes mellitus, obstructive sleep apnea, morbid obesity, and depression    PT Comments    Pt received in supine, agreeable to therapy session and with fair participation and tolerance for transfer and stair training. Pt modI for bed mobility and transfers and min guard to ascend/descend 11 steps (to simulate flight of stairs up to bedroom/bathroom). Pt reports fatigue after stair training and defers hallway gait trial, she did work with mobility tech after lunch as well. Pt continues to benefit from PT services to progress toward functional mobility goals.   Recommendations for follow up therapy are one component of a multi-disciplinary discharge planning process, led by the attending physician.  Recommendations may be updated based on patient status, additional functional criteria and insurance authorization.  Follow Up Recommendations  No PT follow up     Assistance Recommended at Discharge PRN  Patient can return home with the following Assistance with cooking/housework   Equipment Recommendations  None recommended by PT    Recommendations for Other Services       Precautions / Restrictions Precautions Precautions: Fall Restrictions Weight Bearing Restrictions: No      Mobility  Bed Mobility Overal bed mobility: Modified Independent             General bed mobility comments: HOB elevated    Transfers Overall transfer level: Modified independent Equipment used: None Transfers: Sit to/from Stand Sit to Stand: Modified independent (Device/Increase time)           General transfer comment: to from bed height with UE pushing from bed    Ambulation/Gait Ambulation/Gait assistance: Supervision Gait Distance (Feet): 20 Feet Assistive device: None Gait Pattern/deviations: WFL(Within Functional Limits)       General Gait Details: from EOB<>sink, slow pace, no LOB; VSS   Stairs Stairs: Yes Stairs assistance: Min guard Stair Management: One rail Left, Forwards, Step to pattern (offered cane for stability but pt refusing; min guard for safety) Number of Stairs: 11 (5+6 steps with standing break ~1 minute as per home setup with landing in the middle) General stair comments: increased time to perform, standing break after initial 5 steps, sink on L side to simulate handrail; no buckling or LOB, VSS   Wheelchair Mobility    Modified Rankin (Stroke Patients Only)       Balance Overall balance assessment: Mild deficits observed, not formally tested                                          Cognition Arousal/Alertness: Awake/alert, Lethargic Behavior During Therapy: WFL for tasks assessed/performed, Flat affect Overall Cognitive Status: Within Functional Limits for tasks assessed  General Comments: flat affect, WFL cognitively, decreased insight into benefits of mobility and pt not receptive to discussion on benefits of assistive device (for safe mobility progression/for energy conservation as she has been very limited with mobility around her home due to fatigue/poor activity tolerance given cardiac hx).        Exercises      General Comments General comments (skin  integrity, edema, etc.): pt c/o nausea however BP 118/69 standing in room and stable seated as well.      Pertinent Vitals/Pain Pain Assessment Pain Assessment: No/denies pain Pain Intervention(s): Monitored during session, Repositioned    Home Living                          Prior Function            PT Goals (current goals can now be found in the care plan section) Acute Rehab PT Goals Patient Stated Goal: to go home and back to work PT Goal Formulation: With patient Time For Goal Achievement: 10/03/21 Progress towards PT goals: Progressing toward goals    Frequency    Min 3X/week      PT Plan Current plan remains appropriate    Co-evaluation              AM-PAC PT "6 Clicks" Mobility   Outcome Measure  Help needed turning from your back to your side while in a flat bed without using bedrails?: None Help needed moving from lying on your back to sitting on the side of a flat bed without using bedrails?: None Help needed moving to and from a bed to a chair (including a wheelchair)?: None Help needed standing up from a chair using your arms (e.g., wheelchair or bedside chair)?: None Help needed to walk in hospital room?: A Little Help needed climbing 3-5 steps with a railing? : A Little 6 Click Score: 22    End of Session   Activity Tolerance: Patient limited by fatigue Patient left: in bed;with call bell/phone within reach (pt seated EOB, defers assist to return to supine or to sit up in chair) Nurse Communication: Mobility status PT Visit Diagnosis: Difficulty in walking, not elsewhere classified (R26.2)     Time: 6073-7106 PT Time Calculation (min) (ACUTE ONLY): 11 min  Charges:  $Gait Training: 8-22 mins                     Bright Spielmann P., PTA Acute Rehabilitation Services Pager: [phone] Office: [place] 09/21/2021, 6:43 PM

## 2021-09-21 NOTE — Progress Notes (Signed)
Occupational Therapy Treatment Patient Details Name: [name] MRN: [id number] DOB: 02-16-1976 Today's Date: 09/21/2021   History of present illness [name] is a 46 y.o. female who presented after 4 days of worsening breathing and dyspnea wtih exertion. Pt with a history of non-obstructive CAD on cath in 03/2019, non-ischemic cardiomyopathy/ chronic combined CHF with EF as low as <20% in the past but 35-40% on last Echo in 05/2020, LV thrombus on Echo in 03/2019 on Coumadin, paroxysmal atrial fibrillation on Coumadin, hypertension, hyperlipidemia, poorly controlled type 2 diabetes mellitus, obstructive sleep apnea, morbid obesity, and depression   OT comments  Session focused on energy conservation education (handout provided), as well as gradual progression of endurance for daily tasks. Noted BSC at bedside though pt reports ambulating to/from bathroom at least once yesterday without issues. Noted strict I&O order in chart, so placed hat in toilet and encouraged use of bathroom rather than BSC to facilitate endurance retraining. Discussed DME for ADLs (shower chair, BSC) with pt denying need for any DME at DC. Will continue to follow for assessment of energy conservation implementation during ADLs as pt declined to demonstrate these tasks this AM.    Recommendations for follow up therapy are one component of a multi-disciplinary discharge planning process, led by the attending physician.  Recommendations may be updated based on patient status, additional functional criteria and insurance authorization.    Follow Up Recommendations  No OT follow up    Assistance Recommended at Discharge Set up Supervision/Assistance  Patient can return home with the following  Help with stairs or ramp for entrance;Assist for transportation   Equipment Recommendations  None recommended by OT    Recommendations for Other Services      Precautions / Restrictions Precautions Precautions:  Fall Restrictions Weight Bearing Restrictions: No       Mobility Bed Mobility               General bed mobility comments: Declined, requested 1 more hour of rest prior to OOB attempts    Transfers                         Balance                                           ADL either performed or assessed with clinical judgement   ADL Overall ADL's : Needs assistance/impaired                                       General ADL Comments: Noted BSC nearby though pt reports ambulating to/from bathroom at least once yesterday without issues. Placed hat in toilet (Strict I&O orders) and encouraged bathroom mobility frequently to progress endurance/combat SOB.  Provided energy conservation handout and education with emphasis on pacing, rest breaks and breathing techniques. Discussed DME options with pt denying any needs.    Extremity/Trunk Assessment Upper Extremity Assessment Upper Extremity Assessment: Overall WFL for tasks assessed   Lower Extremity Assessment Lower Extremity Assessment: Defer to PT evaluation        Vision   Vision Assessment?: No apparent visual deficits   Perception     Praxis      Cognition Arousal/Alertness: Awake/alert, Lethargic Behavior During Therapy: WFL for tasks assessed/performed, Flat  affect Overall Cognitive Status: Within Functional Limits for tasks assessed                                 General Comments: flat affect, WFL cognitively        Exercises      Shoulder Instructions       General Comments      Pertinent Vitals/ Pain       Pain Assessment Pain Assessment: Faces Faces Pain Scale: Hurts a little bit Pain Location: R UE Pain Descriptors / Indicators: Sore Pain Intervention(s): Monitored during session, Other (comment) (received pain meds earlier in AM)  Home Living                                          Prior  Functioning/Environment              Frequency  Min 2X/week        Progress Toward Goals  OT Goals(current goals can now be found in the care plan section)  Progress towards OT goals: OT to reassess next treatment  Acute Rehab OT Goals Patient Stated Goal: rest for one more hour OT Goal Formulation: With patient Time For Goal Achievement: 10/02/21 Potential to Achieve Goals: Good ADL Goals Pt Will Perform Grooming: standing;Independently Pt Will Perform Lower Body Dressing: Independently;sit to/from stand Pt Will Transfer to Toilet: Independently;ambulating Additional ADL Goal #1: Pt will indep recall at least 3 energy conservation techniques to prepare for safe d/c home  Plan Discharge plan remains appropriate    Co-evaluation                 AM-PAC OT "6 Clicks" Daily Activity     Outcome Measure   Help from another person eating meals?: None Help from another person taking care of personal grooming?: None Help from another person toileting, which includes using toliet, bedpan, or urinal?: A Little Help from another person bathing (including washing, rinsing, drying)?: A Little Help from another person to put on and taking off regular upper body clothing?: None Help from another person to put on and taking off regular lower body clothing?: A Little 6 Click Score: 21    End of Session    OT Visit Diagnosis: Other abnormalities of gait and mobility (R26.89);Unsteadiness on feet (R26.81);Muscle weakness (generalized) (M62.81)   Activity Tolerance Patient limited by fatigue   Patient Left in bed;with call bell/phone within reach   Nurse Communication Mobility status        Time: 0703-0712 OT Time Calculation (min): 9 min  Charges: OT General Charges $OT Visit: 1 Visit OT Treatments $Self Care/Home Management : 8-22 mins  Malachy Chamber, OTR/L Acute Rehab Services Office: [phone]   Layla Maw 09/21/2021, 7:21 AM

## 2021-09-22 ENCOUNTER — Other Ambulatory Visit (HOSPITAL_COMMUNITY): Payer: Self-pay

## 2021-09-22 ENCOUNTER — Encounter (HOSPITAL_COMMUNITY): Payer: Self-pay | Admitting: Family Medicine

## 2021-09-22 DIAGNOSIS — I513 Intracardiac thrombosis, not elsewhere classified: Secondary | ICD-10-CM | POA: Insufficient documentation

## 2021-09-22 DIAGNOSIS — I428 Other cardiomyopathies: Secondary | ICD-10-CM

## 2021-09-22 DIAGNOSIS — I5023 Acute on chronic systolic (congestive) heart failure: Secondary | ICD-10-CM | POA: Diagnosis not present

## 2021-09-22 DIAGNOSIS — I2510 Atherosclerotic heart disease of native coronary artery without angina pectoris: Secondary | ICD-10-CM | POA: Diagnosis present

## 2021-09-22 HISTORY — DX: Other cardiomyopathies: I42.8

## 2021-09-22 HISTORY — DX: Intracardiac thrombosis, not elsewhere classified: I51.3

## 2021-09-22 LAB — BASIC METABOLIC PANEL
Anion gap: 12 (ref 5–15)
BUN: 20 mg/dL (ref 6–20)
CO2: 27 mmol/L (ref 22–32)
Calcium: 9.1 mg/dL (ref 8.9–10.3)
Chloride: 96 mmol/L — ABNORMAL LOW (ref 98–111)
Creatinine, Ser: 1.11 mg/dL — ABNORMAL HIGH (ref 0.44–1.00)
GFR, Estimated: >60 mL/min (ref >60)
Glucose, Bld: 162 mg/dL — ABNORMAL HIGH (ref 70–99)
Potassium: 4.2 mmol/L (ref 3.5–5.1)
Sodium: 135 mmol/L (ref 135–145)

## 2021-09-22 LAB — CBC
HCT: 40.4 % (ref 36.0–46.0)
Hemoglobin: 12.9 g/dL (ref 12.0–15.0)
MCH: 23.9 pg — ABNORMAL LOW (ref 26.0–34.0)
MCHC: 31.9 g/dL (ref 30.0–36.0)
MCV: 74.8 fL — ABNORMAL LOW (ref 80.0–100.0)
Platelets: 380 10*3/uL (ref 150–400)
RBC: 5.4 MIL/uL — ABNORMAL HIGH (ref 3.87–5.11)
RDW: 16.1 % — ABNORMAL HIGH (ref 11.5–15.5)
WBC: 10.8 10*3/uL — ABNORMAL HIGH (ref 4.0–10.5)
nRBC: 0 % (ref 0.0–0.2)

## 2021-09-22 LAB — PROTIME-INR
INR: 2.1 — ABNORMAL HIGH (ref 0.8–1.2)
Prothrombin Time: 23.2 seconds — ABNORMAL HIGH (ref 11.4–15.2)

## 2021-09-22 LAB — GLUCOSE, CAPILLARY
Glucose-Capillary: 167 mg/dL — ABNORMAL HIGH (ref 70–99)
Glucose-Capillary: 204 mg/dL — ABNORMAL HIGH (ref 70–99)

## 2021-09-22 MED ORDER — SACUBITRIL-VALSARTAN 49-51 MG PO TABS
1.0000 | ORAL_TABLET | Freq: Two times a day (BID) | ORAL | 1 refills | Status: DC
Start: 2021-09-22 — End: 2021-10-19
  Filled 2021-09-22: qty 60, 30d supply, fill #0
  Filled ????-??-??: fill #0

## 2021-09-22 MED ORDER — TORSEMIDE 20 MG PO TABS
40.0000 mg | ORAL_TABLET | Freq: Two times a day (BID) | ORAL | 1 refills | Status: DC
  Filled 2021-09-22: qty 120, 30d supply, fill #0
  Filled ????-??-??: fill #0

## 2021-09-22 MED ORDER — CARVEDILOL 6.25 MG PO TABS
6.2500 mg | ORAL_TABLET | Freq: Two times a day (BID) | ORAL | 1 refills | Status: DC
  Filled 2021-09-22: qty 60, 30d supply, fill #0
  Filled ????-??-??: fill #0

## 2021-09-22 MED ORDER — WARFARIN SODIUM 7.5 MG PO TABS
11.2500 mg | ORAL_TABLET | Freq: Once | ORAL | 1 refills | Status: DC
  Filled 2021-09-22: qty 45, 30d supply, fill #0
  Filled ????-??-??: fill #0

## 2021-09-22 MED ORDER — WARFARIN SODIUM 10 MG PO TABS
11.2500 mg | ORAL_TABLET | Freq: Once | ORAL | Status: DC
  Filled 2021-09-22: qty 1

## 2021-09-22 MED ORDER — TORSEMIDE 20 MG PO TABS
40.0000 mg | ORAL_TABLET | Freq: Two times a day (BID) | ORAL | Status: DC
  Administered 2021-09-22: 40 mg via ORAL
  Filled 2021-09-22: qty 2

## 2021-09-22 MED ORDER — POTASSIUM CHLORIDE CRYS ER 20 MEQ PO TBCR
20.0000 meq | EXTENDED_RELEASE_TABLET | Freq: Every day | ORAL | 1 refills | Status: DC
  Filled 2021-09-22: qty 30, 30d supply, fill #0
  Filled ????-??-??: fill #0

## 2021-09-22 MED ORDER — CARVEDILOL 6.25 MG PO TABS
6.2500 mg | ORAL_TABLET | Freq: Two times a day (BID) | ORAL | Status: DC
  Administered 2021-09-22: 6.25 mg via ORAL
  Filled 2021-09-22: qty 1

## 2021-09-22 NOTE — TOC Transition Note (Signed)
Transition of Care (TOC) - CM/SW Discharge Note ? ? ?Patient Details  ?Name: CHARLINE HOSKINSON ?MRN: [id number] ?Date of Birth: 08/02/1975 ? ?Transition of Care (TOC) CM/SW Contact:  ?Zenon Mayo, RN ?Phone Number: ?09/22/2021, 12:51 PM ? ? ?Clinical Narrative:    ?Patient is for dc today, she has no needs. ? ? ?Final next level of care: Home/Self Care ?Barriers to Discharge: No Barriers Identified ? ? ?Patient Goals and CMS Choice ?Patient states their goals for this hospitalization and ongoing recovery are:: wants to remain independent at home ?CMS Medicare.gov Compare Post Acute Care list provided to:: Patient ?  ? ?Discharge Placement ?  ?           ?  ?  ?  ?  ? ?Discharge Plan and Services ?  ?Discharge Planning Services: CM Consult ?           ?  ?DME Agency: NA ?  ?  ?  ?HH Arranged: NA ?  ?  ?  ?  ? ?Social Determinants of Health (SDOH) Interventions ?  ? ? ?Readmission Risk Interventions ?Readmission Risk Prevention Plan 10/01/2019 04/02/2019  ?Transportation Screening Complete Complete  ?PCP or Specialist Appt within 3-5 Days Complete Complete  ?[place] or Home Care Consult Complete Complete  ?Social Work Consult for [place] Planning/Counseling Complete Complete  ?Palliative Care Screening Not Applicable Not Applicable  ?Medication Review Press photographer) - Complete  ?Some recent data might be hidden  ? ? ? ? ? ?

## 2021-09-22 NOTE — Progress Notes (Signed)
Patient ID: [name], female   DOB: [birth year], 46 y.o.   MRN: [id number]     Advanced Heart Failure Rounding Note  PCP-Cardiologist: Loralie Champagne, MD   Subjective:    Not very talkative but no complaints.  Says she walked in the hall.   R/LHC 2/28:     Mid Cx lesion is 30% stenosed.   Prox RCA to Mid RCA lesion is 25% stenosed.   1. Mild, nonobstructive CAD. Nonischemic cardiomyopathy.  2. Elevated right and left heart filling pressures.  3. Preserved cardiac output.  4. PAPI low at 1.5.  5. Pulmonary venous hypertension.   Cardiac MRI (2/28):  1. Mildly dilated LV with LV EF 23%, diffuse hypokinesis with inferior akinesis. 2.  Normal RV size with moderate systolic dysfunction, EF 93%. 3. No myocardial LGE so no definitive evidence for prior MI, infiltrative disease, or myocarditis.   Objective:   Weight Range: 81 kg Body mass index is 36.05 kg/m.   Vital Signs:   Temp:  [98 F (36.7 C)-98.4 F (36.9 C)] 98.4 F (36.9 C) (03/01 0559) Pulse Rate:  [96] 96 (02/28 1930) Resp:  [16-19] 16 (03/01 0559) BP: (111-120)/(67-72) 111/72 (03/01 0559) SpO2:  [96 %-98 %] 98 % (03/01 0559) Weight:  [81 kg] 81 kg (03/01 0601) Last BM Date : 09/19/21  Weight change: Filed Weights   09/20/21 0406 09/21/21 0516 09/22/21 0601  Weight: 82.4 kg 80.3 kg 81 kg    Intake/Output:   Intake/Output Summary (Last 24 hours) at 09/22/2021 0859 Last data filed at 09/22/2021 0602 Gross per 24 hour  Intake 1730 ml  Output 2350 ml  Net -620 ml      Physical Exam    General: NAD Neck: No JVD, no thyromegaly or thyroid nodule.  Lungs: Clear to auscultation bilaterally with normal respiratory effort. CV: Lateral PMI.  Heart regular S1/S2, no S3/S4, no murmur.  No peripheral edema.   Abdomen: Soft, nontender, no hepatosplenomegaly, no distention.  Skin: Intact without lesions or rashes.  Neurologic: Alert and oriented x 3.  Psych: Normal affect. Extremities: No clubbing or  cyanosis.  HEENT: Normal.   Telemetry   NSR 80s, personally reviewed   EKG    Not performed today   Labs    CBC Recent Labs    09/21/21 0602 09/22/21 0550  WBC 9.8 10.8*  HGB 13.2 12.9  HCT 41.2 40.4  MCV 74.6* 74.8*  PLT 385 235   Basic Metabolic Panel Recent Labs    09/21/21 0602 09/22/21 0550  NA 136 135  K 3.9 4.2  CL 96* 96*  CO2 28 27  GLUCOSE 211* 162*  BUN 20 20  CREATININE 1.14* 1.11*  CALCIUM 9.0 9.1  MG 2.2  --    Liver Function Tests No results for input(s): AST, ALT, ALKPHOS, BILITOT, PROT, ALBUMIN in the last 72 hours.  No results for input(s): LIPASE, AMYLASE in the last 72 hours. Cardiac Enzymes No results for input(s): CKTOTAL, CKMB, CKMBINDEX, TROPONINI in the last 72 hours.  BNP: BNP (last 3 results) Recent Labs    11/24/20 1740 09/17/21 2115  BNP 47.5 304.0*    ProBNP (last 3 results) No results for input(s): PROBNP in the last 8760 hours.   D-Dimer No results for input(s): DDIMER in the last 72 hours. Hemoglobin A1C No results for input(s): HGBA1C in the last 72 hours. Fasting Lipid Panel No results for input(s): CHOL, HDL, LDLCALC, TRIG, CHOLHDL, LDLDIRECT in the last 72 hours.  Thyroid  Function Tests No results for input(s): TSH, T4TOTAL, T3FREE, THYROIDAB in the last 72 hours.  Invalid input(s): FREET3  Other results:   Imaging    No results found.   Medications:     Scheduled Medications:  atorvastatin  40 mg Oral Daily   carvedilol  6.25 mg Oral BID WC   digoxin  0.125 mg Oral Daily   empagliflozin  25 mg Oral Daily   gabapentin  100 mg Oral BID   insulin aspart  0-15 Units Subcutaneous TID WC   insulin glargine-yfgn  18 Units Subcutaneous Daily   sacubitril-valsartan  1 tablet Oral BID   sertraline  25 mg Oral Daily   sodium chloride flush  3 mL Intravenous Q12H   sodium chloride flush  3 mL Intravenous Q12H   spironolactone  25 mg Oral Daily   torsemide  40 mg Oral BID   warfarin  11.25 mg  Oral ONCE-1600   Warfarin - Pharmacist Dosing Inpatient   Does not apply q1600    Infusions:  sodium chloride      PRN Medications: sodium chloride, acetaminophen, albuterol, hydrOXYzine, ondansetron (ZOFRAN) IV, oxyCODONE, sodium chloride flush    Patient Profile   46 y.o. 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   Echo: LVEF 20%, GIIIDD (restrictive), RV severely reduced. Mild MR/TR    Assessment/Plan   1.  Acute on Chronic Biventricular Heart Failure:  Nonischemic cardiomyopathy, diagnosed in 6/20. Repeat Echo 03/2019 showed EF 20-25%, diffuse hypokinesis, LV thrombus, moderate RV systolic dysfunction, mild MR, moderate TR. Echo in 3/21 with EF <30%, severe RV dysfunction.  She has a history of paroxysmal atrial fibrillation but seems to be mostly in sinus rhythm.  This is not likely to be a tachycardia-mediated cardiomyopathy.  Poorly controlled diabetes itself can cause a cardiomyopathy, as can poorly controlled HTN.  Myocarditis is a potential cause of her cardiomyopathy. [place] [facility] 9/20 showed nonobstructive CAD. RHC showed R>L heart failure with low PAPI suggesting significant RV dysfunction.  Echo in 6/21 with EF up to 35%. Echo 11/21 EF 35-40% Gard[place] I DD. Now admitted w/ a/c CHF. NYHA Class III. Echo this admit, EF 20%, GIIIDD (restrictive), RV moderately reduced. Only mild MR and TR. R/LHC today showed mild, nonobstructive, CAD, elevated right and left heart filling pressures and pulmonary venous hypertension w/ preserved cardiac output w/ Fick CI 2.72 w/ low PAPi score of 1.5. mPCWP 21 and RA 12.  Cardiac MRI with LV EF 23%, no LGE. On exam, looks close to euvolemic.  - Start torsemide 40 mg bid today.  She apparently was taking 20 mg torsemide tablets  4-5 times a day prior to admission.     - Continue Entresto 49-51 mg bid (did not tolerate home dose 97-103 due to soft BP)  - Continue Spiro 25 mg daily  - Restart Coreg at 6.25 mg bid.  - Continue Digoxin 0.125 mg. Dig level <0.2 on admit   - Continue empagliflozin 25 mg daily.   - She previously had been trying to get pregnant (has been difficult with polycystic ovarian syndrome).  She has 2 grown children already. With her cardiomyopathy and active CHF, pregnancy would create a significant mortality risk for her.  A number of her cardiac meds to control her CHF are feto-toxic. Discussed importance of strict regular birth  control/ use of condoms to avoid pregnancy   - Would need better compliance and blood glucose control (A1c 14 recently) for advanced therapies including heart transplant and LVAD.  2. 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  Not seen on cMRI this admission.  - Given severe BiV failure/ HK, would continue coumadin indefinitely for secondary prevention, INR 2.1 today.  3. Atrial fibrillation, Paroxysmal: NSR here. - Continue Coumadin (using Coumadin rather than DOAC with h/o LV thrombus).  - Would consider atrial fibrillation ablation in the future, if recurrence, given CASTLE-HF data.  4. Mild Nonobstructive CAD: Diffuse 25% p-mRCA, 30% mid LCx lession  - continue statin w/ LDL goal < 70 - no ASA given chronic coumadin  5. Type 2 diabetes: Poorly controlled. - Insulin per primary team  - c/w Jardiance. Monitor for GU infections  6. HTN: Not elevated.   7. Depression  - on Zoloft  8. SDOH - multiple psychosocial issues and h/o poor compliance - followed by HF SW team   From my standpoint, she can go home today.  Will need close followup in CHF clinic and will need coumadin clinic followup.  Cardaic meds for home: warfarin, Coreg 6.25 mg bid, digoxin 0.125 daily, Entresto 49/51 bid, empagliflozin 25 daily, spironolactone 25 daily, torsemide 40 bid, KCl 20 daily,      Length of Stay: 4  Loralie Champagne, MD  09/22/2021, 8:59 AM  Advanced Heart Failure Team Pager [phone] (M-F; 7a - 5p)  Please contact [facility] for night-coverage after hours (5p -7a ) and weekends on [email or web address]

## 2021-09-22 NOTE — Discharge Summary (Signed)
[facility] Discharge Summary  Patient name: [name] Medical record number: [id number] Date of birth: [birth year] Age: 46 y.o. Gender: female Date of Admission: 09/17/2021  Date of Discharge: 09/22/2021 Admitting Physician: Blane Ohara McDiarmid, MD  Primary Care Provider: Gifford Shave, MD Consultants: Cardiology, heart failure team  Indication for Hospitalization: Respiratory distress due to HFrEF exacerbation  Discharge Diagnoses/Problem List:  Principal Problem:   Acute combined systolic and diastolic heart failure ([place]) Active Problems:   Diabetes mellitus type 2, insulin dependent ([place])   Obstructive sleep apnea   Chronic combined systolic and diastolic heart failure (HCC)   Paroxysmal atrial fibrillation ([place])   Biventricular failure ([place])   Morbid obesity ([place])   Nonischemic cardiomyopathy ([place])   Nonobstructive atherosclerosis of coronary artery    Disposition: Home  Discharge Condition: Stable  Discharge Exam:  General: Patient lying in bed, NAD Cardio: RRR, normal S1/S2, no murmurs Respiratory: CTAB, normal work of breathing Abdomen: Soft, nontender to palpation, nondistended Extremities: No edema BLEs  Brief Hospital Course:  [name] is a 46 y.o. female presenting with increased work of breathing. PMH is significant for T2DM, HFrEF, PAF, PCOS, HTN, OSA, HLD, anxiety, obesity, and asthma. Hospital course outlined below:   Dyspnea and respiratory distress likely secondary to HFrEF exacerbation (EF 20%)   Nonischemic cardiomyopathy  Patient presented in respiratory distress, was able to maintain oxygen requirements but was on 2L oxygen for comfort. Patient was volume overloaded on exam during admission and required diuresis. She had a total urinary output of 6.9 L prior to discharge and was breathing comfortably on room air by time of discharge.  Patient's dry weight was 177.1 pounds (80.3 kg) the heart failure team and  cardiology were consulted.  Patient had echocardiogram on 09/18/2021 showing EF of 20%, left ventricle with severely decreased function, grade 3 diastolic dysfunction. Cardiac cath performed as showed mild, non-obstructive CAD, elevated heart filling pressures and pulmonary venous hypertension. Cardiac MRI showed mildly dilated LV with EF of 23%, diffuse hypokinesis with inferior akinesis.  Patient was discharged on warfarin, Coreg 6.25 mg twice daily, digoxin 0.125 daily, Entresto 49-51 twice daily, Jardiance 25 mg daily, spironolactone 25 mg daily, torsemide 40 mg twice daily, potassium chloride 20 meq daily.  pAFib   H/o left ventricular thrombus  Patient was treated with Heparin gtt and received a cardiac cath procedure that showed mild non-obstructive CAD. Patient was discharged on digoxin and warfarin.  HTN  Intermittently, she experienced soft pressures, so blood pressure medications were held and restarted accordingly.  Patient was discharged on spironolactone and Coreg.  T2DM Continued with LAI 18U and Jardiance. Discharged on her home regimen.  Prandial blood sugars were high in the mid 200s, consider adding GLP-1 for better control.  Other chronic and stable conditions treated accordingly.   Issues for follow-up: Ensure follow-up with cardiology Ensure follow-up with Coumadin clinic Consider adding GLP-1 for better blood glucose control    Significant Procedures: Cardiac cath on 09/20/21 Cardiac MR on 09/20/2021  Significant Labs and Imaging:  Recent Labs  Lab 09/20/21 0445 09/20/21 0840 09/21/21 0602 09/22/21 0550  WBC 10.7*  --  9.8 10.8*  HGB 12.4 12.6   12.9 13.2 12.9  HCT 39.0 37.0   38.0 41.2 40.4  PLT 388  --  385 380   Recent Labs  Lab 09/17/21 2115 09/17/21 2204 09/18/21 0328 09/19/21 0311 09/20/21 0445 09/20/21 0840 09/21/21 0602 09/22/21 0550  NA 133*   < > 133*  136 137 140   139 136 135  K 3.9   < > 4.0 3.7 4.8 3.6   3.6 3.9 4.2  CL 104  --  101  100 99  --  96* 96*  CO2 20*  --  20* 25 28  --  28 27  GLUCOSE 291*  --  308* 128* 133*  --  211* 162*  BUN 10  --  '9 17 17  ' --  20 20  CREATININE 0.91  --  0.86 0.91 1.10*  --  1.14* 1.11*  CALCIUM 8.8*  --  8.8* 8.8* 8.7*  --  9.0 9.1  MG  --   --   --  1.9  --   --  2.2  --   ALKPHOS 99  --   --   --   --   --   --   --   AST 23  --   --   --   --   --   --   --   ALT 15  --   --   --   --   --   --   --   ALBUMIN 2.8*  --   --   --   --   --   --   --    < > = values in this interval not displayed.      Results/Tests Pending at Time of Discharge: none  Discharge Medications:  Allergies as of 09/22/2021       Reactions   Shellfish Allergy Swelling   Airway involvement including EMS - Has Epi-Pen   Dulaglutide Nausea And Vomiting, Other (See Comments)   Multiple episodes of vomiting over multiple days   Metformin And Related Diarrhea   Ibuprofen Other (See Comments)   Per PCP interferes with daily meds (heart failure)        Medication List     STOP taking these medications    Entresto 97-103 MG Generic drug: sacubitril-valsartan Replaced by: sacubitril-valsartan 49-51 MG   FreeStyle Libre 2 [employer] 2 Sensor Misc   hydrALAZINE 50 MG tablet Commonly known as: APRESOLINE       TAKE these medications    Accu-Chek Aviva Plus w/Device Kit 1 application by Does not apply route QID. Check fasting sugars as well as throughout the day   Accu-Chek Softclix Lancet Dev Kit 1 application by Does not apply route 2 (two) times daily.   Accu-Chek Softclix Lancets lancets Use as instructed   albuterol 108 (90 Base) MCG/ACT inhaler Commonly known as: VENTOLIN HFA Inhale 2 puffs into the lungs every 6 (six) hours as needed for wheezing or shortness of breath.   atorvastatin 40 MG tablet Commonly known as: LIPITOR TAKE 1 TABLET BY MOUTH DAILY AT 6 PM.   blood glucose meter kit and supplies Kit Dispense based on patient and insurance  preference. Use up to four times daily as directed. (FOR ICD-9 250.00, 250.01).   busPIRone 7.5 MG tablet Commonly known as: BUSPAR Take 7.5 mg by mouth 2 (two) times daily.   carvedilol 6.25 MG tablet Commonly known as: COREG Take 1 tablet (6.25 mg total) by mouth 2 (two) times daily with a meal. What changed:  medication strength how much to take   digoxin 0.125 MG tablet Commonly known as: LANOXIN Take 1 tablet (0.125 mg total) by mouth daily.   empagliflozin 25 MG Tabs tablet Commonly known as: JARDIANCE Take 25 mg  by mouth daily.   gabapentin 100 MG capsule Commonly known as: NEURONTIN Take 100 mg by mouth 2 (two) times daily.   glucose blood test strip Commonly known as: AgaMatrix Presto Test Use as instructed   Accu-Chek Aviva Plus test strip Generic drug: glucose blood Check CBG 2 times per day   hydrOXYzine 10 MG tablet Commonly known as: ATARAX Take 1 tablet (10 mg total) by mouth 3 (three) times daily as needed.   INSULIN SYRINGE .3CC/31GX5/16" 31G X 5/16" 0.3 ML Misc 15 Units by Does not apply route 2 (two) times daily.   Lantus SoloStar 100 UNIT/ML Solostar Pen Generic drug: insulin glargine Inject 36 Units into the skin every morning.   meclizine 12.5 MG tablet Commonly known as: ANTIVERT Take 1 tablet (12.5 mg total) by mouth 3 (three) times daily as needed for dizziness.   NovoLOG FlexPen 100 UNIT/ML FlexPen Generic drug: insulin aspart Inject 12 Units into the skin daily before supper.   potassium chloride SA 20 MEQ tablet Commonly known as: KLOR-CON M Take 1 tablet (20 mEq total) by mouth daily. What changed:  when to take this additional instructions   sacubitril-valsartan 49-51 MG Commonly known as: ENTRESTO Take 1 tablet by mouth 2 (two) times daily. Replaces: Entresto 97-103 MG   sertraline 25 MG tablet Commonly known as: ZOLOFT Take 1 tablet (25 mg total) by mouth daily.   spironolactone 25 MG tablet Commonly known as:  ALDACTONE Take 25 mg by mouth daily.   torsemide 20 MG tablet Commonly known as: DEMADEX Take 2 tablets (40 mg total) by mouth 2 (two) times daily. What changed:  how much to take when to take this   traZODone 50 MG tablet Commonly known as: DESYREL Take 1 tablet (50 mg total) by mouth at bedtime as needed for sleep.   Unifine Pentips 31G X 5 MM Misc Generic drug: Insulin Pen Needle USE AS DIRECTED 2 TIMES DAILY   warfarin 7.5 MG tablet Commonly known as: COUMADIN Take as directed. If you are unsure how to take this medication, talk to your nurse or doctor. Original instructions: Take 1 and 1/2 tablets (11.25 mg total) by mouth one time only at 4 PM. What changed:  how much to take how to take this when to take this additional instructions        Discharge Instructions: Please refer to Patient Instructions section of EMR for full details.  Patient was counseled important signs and symptoms that should prompt return to medical care, changes in medications, dietary instructions, activity restrictions, and follow up appointments.   Follow-Up Appointments:  Follow-up Information     Autry-Lott, Naaman Plummer, DO. Go on 09/27/2021.   Specialty: Family Medicine Why: Appointment at 3:10 pm, please arrive at least 15 minutes prior to your scheduled appointment time. Contact information: [address] [address] [phone]         Larey Dresser, MD Follow up on 11/22/2021.   Specialty: Cardiology Why: Appointment at 11:40 am, please arrive at least 15 minutes prior to your scheduled appointment time. Contact information: [address] [address] [phone]         [facility] Follow up on 09/24/2021.   Specialty: Cardiology Why: Appointment at 11 am, please arrive at least 15 minutes prior to your scheduled appointment time. Contact information: [address] [place]  [place]  Precious Gilding, DO 09/22/2021, 2:13 PM PGY-1, [place]

## 2021-09-22 NOTE — Progress Notes (Signed)
ANTICOAGULATION CONSULT NOTE ? ?Pharmacy Consult for Warfarin ?Indication: atrial fibrillation and h/o LV thrombus  ? ?Allergies  ?Allergen Reactions  ? Shellfish Allergy Swelling  ?  Airway involvement including EMS - Has Epi-Pen  ? Dulaglutide Nausea And Vomiting and Other (See Comments)  ?  Multiple episodes of vomiting over multiple days  ? Metformin And Related Diarrhea  ? Ibuprofen Other (See Comments)  ?  Per PCP interferes with daily meds (heart failure)  ? ? ?Patient Measurements: ?Height: 4\' 11"  (149.9 cm) ?Weight: 81 kg (178 lb 8 oz) ?IBW/kg (Calculated) : 43.2 ? ?Heparin Dosing Weight: 63 kg ? ?Vital Signs: ?Temp: 98.4 ?F (36.9 ?C) (03/01 0559) ?Temp Source: Oral (03/01 0559) ?BP: 111/72 (03/01 0559) ? ?Labs: ?Recent Labs  ?  09/19/21 ?1040 09/19/21 ?1722 09/20/21 ?0445 09/20/21 ?0445 09/20/21 ?0840 09/21/21 ?0602 09/21/21 ?0802 09/22/21 ?7893  ?HGB  --   --  12.4   < > 12.6  12.9 13.2  --  12.9  ?HCT  --   --  39.0   < > 37.0  38.0 41.2  --  40.4  ?PLT  --   --  388  --   --  385  --  380  ?LABPROT  --   --  20.9*  --   --   --  20.2* 23.2*  ?INR  --   --  1.8*  --   --   --  1.7* 2.1*  ?HEPARINUNFRC 0.60 0.92* 0.83*  --   --   --   --   --   ?CREATININE  --   --  1.10*  --   --  1.14*  --  1.11*  ? < > = values in this interval not displayed.  ? ? ? ?Estimated Creatinine Clearance: 60.1 mL/min (A) (by C-G formula based on SCr of 1.11 mg/dL (H)). ? ? ?Assessment: ?46 yo female with medical history significant for atrial fibrillation and LV thrombus 03/2019 on warfarin PTA who was admitted with shortness of breath and CP. INR subtherapeutic at 1.4 on admission. Pt was initially restarted on warfarin but transitioned to heparin with plans for cardiac cath 2/27. Pharmacy consulted to continue warfarin. CBC is stable. No bleeding noted. ? ?Anticoagulation 09/03/21 Warfarin Regimen: ? ? 2.0-3.0  ?TTR:  38.5 % (1.9 y)  ?INR used for dosing:  2.2 (09/03/2021)  ?Warfarin maintenance plan:  11.25 mg (7.5 mg x  1.5) every day  ?Weekly warfarin total:  78.75 mg  ? ?INR therapeutic 09/22/21 AM at 2.1 s/p 15 mg dose 2/28 (INR 1.7>2.1). No signs of bleeding reported. Possible discharge 09/22/21 pending HF team recommendations. Would restart warfarin per home schedule at discharge. Most recent ambulatory care monitoring 09/03/21 shows INR therapeutic at 2.2. INR subtherapeutic on admit due to a missed dose and one missed dose on 2/26 while inpatient.  ? ?Goal of Therapy:  ?INR 2-3 ?Heparin level 0.3-0.7 units/ml ?Monitor platelets by anticoagulation protocol: Yes ?  ?Plan:  ?Give warfarin 11.25 mg PO x1 dose. Would recommend resuming PTA regimen at discharge. ?Check INR daily while on warfarin ?Continue to monitor H&H and platelets ? ? ?Thank you for allowing pharmacy to participate in this patient's care. ? ?Adria Dill, PharmD ?PGY-1 Acute Care Resident  ?09/22/2021 8:48 AM  ? ? ? ?

## 2021-09-22 NOTE — Discharge Instructions (Addendum)
Dear [name], ? ?Thank you for letting us participate in your care. You were hospitalized for respiratory distress related to heart failure and diagnosed with Acute on chronic systolic CHF (congestive heart failure) ([place]). You were treated with IV lasix (a diuretic).  ? ?POST-HOSPITAL & CARE INSTRUCTIONS ?Follow up with the coumadin clinic for warfarin dosing ?Go to your follow up appointments (listed below) with the pharmacist, primary care doctor, vascular, coumadin clinic, and heart doctor. ?The following medications were changed: ? -Take Coreg 6.25 mg twice daily ? -Potassium chloride 20 meq 1 tablet by mouth daily ? -Torsemide 40 mg (2 tablets) 2 times daily ? -Warfarin 11.25 mg ( 1 and 1/2 tablets) daily at 4 PM ? -Entresto 49-51 mg 1 tablet twice daily ? ? ?DOCTOR'S APPOINTMENT   ?Future Appointments  ?Date Time Provider [place]  ?09/23/2021  9:45 AM Leavy Cella, RPH-CPP [facility]  ?09/24/2021 11:00 AM [facility]  ?09/27/2021  3:10 PM Autry-Lott, Naaman Plummer, DO [facility]  ?10/08/2021 11:00 AM [facility] PA/NP [facility] None  ?11/22/2021 11:40 AM Larey Dresser, MD [facility] None  ? ? Follow-up Information   ? ? Autry-Lott, Simone, DO. Go on 09/27/2021.   ?Specialty: Family Medicine ?Why: Appointment at 3:10 pm, please arrive at least 15 minutes prior to your scheduled appointment time. ?Contact information: ?[address] [place] [address] ?[phone] ? ? ?  ?  ? ?  ?  ? ?  ? ? ?Take care and be well! ? ?Family Medicine Teaching Service Inpatient Team ?[place]  ?[facility]  ?[address] [place], [place] [address] ?[phone] ? ? ? ?

## 2021-09-22 NOTE — Progress Notes (Signed)
Inpatient Diabetes Program Recommendations ? ?AACE/ADA: New Consensus Statement on Inpatient Glycemic Control (2015) ? ?Target Ranges:  Prepandial:   less than 140 mg/dL ?     Peak postprandial:   less than 180 mg/dL (1-2 hours) ?     Critically ill patients:  140 - 180 mg/dL  ? ? Latest Reference Range & Units 09/21/21 05:59 09/21/21 11:29 09/21/21 16:15 09/21/21 20:29  ?Glucose-Capillary 70 - 99 mg/dL 215 (H) ? ?5 units Novolog ? ?18 units Semglee @0835  ? 194 (H) ? ?3 units Novolog ? 251 (H) ? ?8 units Novolog ? 258 (H)  ? ? Latest Reference Range & Units 09/22/21 06:06  ?Glucose-Capillary 70 - 99 mg/dL 167 (H) ? ?3 units Novolog ?  ? ? ? ?Home DM Meds: Lantus 36 units daily (started 09/02/2021) ?       Novolog 15 units With Dinner (started 09/02/2021) ?       Jardiance 25 mg daily ? ? ?Current Orders: Semglee 18 units daily  ?  Novolog 0-15 units TID  ?Jardiance 25 mg daily ? ? ? ?Just saw PCP ([facility]) 09/02/2021 ?70/30 Insulin BID was stopped and  ?Pt was started on Lantus 36 units Daily + Novolog 15 units once daily with largest meal of the day (dinner) ?A1c at that visit was 13.2% ? ? ?MD- Note afternoon CBGs remain elevated.  Please consider starting Novolog Meal Coverage:  ? ?Novolog 4 units TID with meals ? ?HOLD if pt eats <50% meals ? ? ? ? ?--Will follow patient during hospitalization-- ? ?Wyn Quaker RN, MSN, CDE ?Diabetes Coordinator ?Inpatient Glycemic Control Team ?Team Pager: [phone] (8a-5p) ? ? ?

## 2021-09-23 ENCOUNTER — Telehealth: Payer: Self-pay

## 2021-09-23 ENCOUNTER — Ambulatory Visit: Payer: Medicaid Other | Admitting: Pharmacist

## 2021-09-23 NOTE — Telephone Encounter (Signed)
Transition Care Management Unsuccessful Follow-up Telephone Call ? ?Date of discharge and from where:  09/22/2021 from [facility] ? ?Attempts:  1st Attempt ? ?Reason for unsuccessful TCM follow-up call:  Left voice message ? ? ? ?

## 2021-09-24 ENCOUNTER — Telehealth: Payer: Self-pay | Admitting: *Deleted

## 2021-09-24 NOTE — Telephone Encounter (Signed)
Pt missed appointment to have INR checked. Called pt and LMOM to make an appointment.  ?

## 2021-09-24 NOTE — Telephone Encounter (Signed)
Transition Care Management Unsuccessful Follow-up Telephone Call ? ?Date of discharge and from where:  09/22/2021 from [facility] ? ?Attempts:  2nd Attempt ? ?Reason for unsuccessful TCM follow-up call:  Voice mail full ? ?  ?

## 2021-09-26 NOTE — Progress Notes (Signed)
? ? ?  SUBJECTIVE:  ? ?CHIEF COMPLAINT / HPI:  ? ?Hospital follow up, Dyspnea ?Doing well. No concerns at this time. Denies shortness of breath but also states she has been taking it easy. Called by coumadin clinic but missed the call. Plans to call back. Has scheduled follow up with Cardiology. Has not monitored her blood sugar or blood pressure since discharge. States she was supposed to receive a CGM but has not yet received it. She does not have any supplies at home. She does not prefer to change any of her blood pressure medication today, she will wait to discuss with PCP and cardiology.  ? ?Missed period ?Late by 3 days. She is sexually active and does not use any form of contraception. She states she is usually regular and has only missed her periods with pregnancies. Birth control undesired today.  ? ?PERTINENT  PMH / PSH: PAF, HTN, combined S/D HF, OSA, asthma ? ?OBJECTIVE:  ? ?BP (!) 150/80   Pulse 89   Ht $R'4\' 11"'ow$  (1.499 m)   Wt 187 lb (84.8 kg)   SpO2 99%   BMI 37.77 kg/m?  ?Physical Exam ?Vitals reviewed.  ?Constitutional:   ?   General: She is not in acute distress. ?   Appearance: She is not ill-appearing, toxic-appearing or diaphoretic.  ?Cardiovascular:  ?   Rate and Rhythm: Normal rate and regular rhythm.  ?   Heart sounds: Normal heart sounds.  ?Pulmonary:  ?   Effort: Pulmonary effort is normal.  ?   Breath sounds: Normal breath sounds.  ?Neurological:  ?   Mental Status: She is alert and oriented to person, place, and time. Mental status is at baseline.  ?Psychiatric:     ?   Mood and Affect: Mood normal.     ?   Behavior: Behavior normal.  ? ?ASSESSMENT/PLAN:  ? ?1. Hospital discharge follow-up ?Reviewed hospital course. Admission 2/24-3/1 for Dyspnea. Requires follow up with cardiology, coumadin clinic. Hgb 13.2 on 2/7. Needs pharmacy follow up (scheduled for 2/23) Will give supplies to check blood sugars at home, offered record sheet and patient declined. Will send message to pharmacy team  to follow up on CGM status. Continue current medications.  ?- blood glucose meter kit and supplies KIT; Dispense based on patient and insurance preference. Use up to four times daily as directed. (FOR ICD-9 250.00, 250.01).  Dispense: 1 each; Refill: 0 ? ?2. Hypertension associated with diabetes ([place]) ?Elevated today. Asymptomatic. On several medications and she does not desire any changes today. Would like to discuss with cardiology and PCP. Return precautions given.   ? ?3. Missed period ?Negative. Consider repeating in 1-2 weeks.  ? ?Tammy Fee, DO ?[place]  ?

## 2021-09-27 ENCOUNTER — Ambulatory Visit (INDEPENDENT_AMBULATORY_CARE_PROVIDER_SITE_OTHER): Payer: Medicaid Other | Admitting: Family Medicine

## 2021-09-27 ENCOUNTER — Encounter: Payer: Self-pay | Admitting: Family Medicine

## 2021-09-27 ENCOUNTER — Other Ambulatory Visit: Payer: Self-pay

## 2021-09-27 VITALS — BP 150/80 | HR 89 | Ht 59.0 in | Wt 187.0 lb

## 2021-09-27 DIAGNOSIS — Z09 Encounter for follow-up examination after completed treatment for conditions other than malignant neoplasm: Secondary | ICD-10-CM

## 2021-09-27 DIAGNOSIS — E1159 Type 2 diabetes mellitus with other circulatory complications: Secondary | ICD-10-CM

## 2021-09-27 DIAGNOSIS — N926 Irregular menstruation, unspecified: Secondary | ICD-10-CM

## 2021-09-27 DIAGNOSIS — I152 Hypertension secondary to endocrine disorders: Secondary | ICD-10-CM

## 2021-09-27 LAB — POCT URINE PREGNANCY: Preg Test, Ur: NEGATIVE

## 2021-09-27 MED ORDER — BLOOD GLUCOSE MONITOR KIT: PACK | 0 refills | Status: DC

## 2021-09-27 NOTE — Patient Instructions (Addendum)
It was wonderful to see you today. ? ?Please bring ALL of your medications with you to every visit.  ? ?Today we talked about: ? ?Following up with cardiology and coumadin clinic.  ?Follow up with PCP on blood pressure.  ?Schedule follow up with pharmacy team Dr. Valentina Lucks or Rosendo Gros about your diabetes.  ?Your pregnancy test is negative. If you believe you are pregnant I would advise taking another pregnancy test in 2 weeks.  ? ?Please be sure to schedule follow up at the front  desk before you leave today.  ? ?If you haven't already, sign up for My Chart to have easy access to your labs results, and communication with your primary care physician. ? ?Please call the clinic at [phone] if your symptoms worsen or you have any concerns. It was our pleasure to serve you. ? ?Dr. Janus Molder ? ?

## 2021-09-28 ENCOUNTER — Other Ambulatory Visit (HOSPITAL_COMMUNITY): Payer: Self-pay

## 2021-09-29 ENCOUNTER — Other Ambulatory Visit (HOSPITAL_COMMUNITY): Payer: Self-pay

## 2021-09-29 NOTE — Telephone Encounter (Signed)
Transition Care Management Unsuccessful Follow-up Telephone Call ? ?Date of discharge and from where:  09/22/2021 from [facility] ? ?Attempts:  3rd Attempt ? ?Reason for unsuccessful TCM follow-up call:  Unable to reach patient ? ? ? ?

## 2021-09-30 ENCOUNTER — Other Ambulatory Visit (HOSPITAL_COMMUNITY): Payer: Self-pay

## 2021-10-01 ENCOUNTER — Other Ambulatory Visit (HOSPITAL_COMMUNITY): Payer: Self-pay

## 2021-10-04 ENCOUNTER — Other Ambulatory Visit (HOSPITAL_COMMUNITY): Payer: Self-pay

## 2021-10-05 ENCOUNTER — Ambulatory Visit: Payer: Medicaid Other | Admitting: Pharmacist

## 2021-10-05 NOTE — Progress Notes (Incomplete)
?Advanced Heart Failure Clinic Note  ? ? ?PCP: Gifford Shave, MD ?HF Cardiologist: Dr. Aundra Dubin  ?EP- Dr. Curt Bears  ? ?HPI: ?[name] is a 46 y.o. with history of type 2 diabetes (poorly controlled, last hgbA1c > 15), poorly controlled HTN, hyperlipidemia, paroxysmal atrial fibrillation, and chronic systolic CHF of uncertain etiology.  Patient had an echo in 6/20 as outpatient due to exertional dyspnea. This showed EF 20-25%, moderate RV dysfunction.  She was also noted to have paroxysmal atrial fibrillation.  She was started on Xarelto.  In 7/20, she was admitted with DKA. In 8/20, she was admitted with CHF exacerbation and diuresed.  She was sent home on Lasix 40 mg po bid.  ?  ?Readmitted on 9/20 with CHF exacerbation, in the setting of elevated BP.  Diuresed with IV lasix.  LHC showed nonobstructive CAD.  RHC showed R>L heart failure with low PAPI suggesting significant RV dysfunction. She was transitioned to PO torsemide and treated w/ guidelines directed medical therapy w/ Entresto, Spironolactone, Coreg and digoxin. She was also transitioned off of Xarelto and placed on Coumadin. ? ?Readmitted 09/27/4654 with A/C systolic heart failure. She had not been taking her lasix because she says it was on her AVS to stop it.  Diuresed with IV lasix and transitioned to torsemide 40 mg daily. Weight day before discharge was 190 pounds. Echo with LV EF <20%, RV severely decreased systolic function.  ? ?Echo in 6/21 showed EF 35%, moderately decreased RV systolic function, moderate LAE, mild MR.  Echo was done today and reviewed, EF 35-40%, global hypokinesis, mild LVH, normal RV, IVC normal. She was also ordered to get a cMRI to look for scar/infiltrative disease and quantify EF. This has not been done yet.  ? ?She has had recent issues w/ controlling fluid given intermittent compliance w/ meds, has required adjustments to diuretics. At clinic f/u 2/22, she had stopped taking her medications because she thought she  was pregnant. Urine pregnancy test was negative. Meds restarted. Also w/ poorly controlled DM.  ? ?Today she returns for HF follow up. 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 Continues to struggle with psychosocial issues. She does not have a car right now. She is not overly SOB with walking on flat ground. Denies palpitations, abnormal bleeding, CP, dizziness, edema, or PND/Orthopnea. Appetite ok. No fever or chills. Weight at home 134 pounds. Doing better with taking her medications, has stopped her BiDil due to worsening migraines.  ? ? ?Labs (5/21): hgbA1c 9.3, BNP 76, K 3.9, creatinine 1.1 ?Labs (6/21): digoxin 0.4, K 4.1, creatinine 0.77 ?Labs (10/21): BNP 25, K 4.1, creatinine 0.91 ?Labs (11/21): K 3.7, creatinine 0.6, digoxin <0.2 ?Labs (12/21): K 4.2, creatinine 0.83 ?Labs (07/28/2020): K 3.9 Creatinine 0/7  ?Labs ( 09/03/20): K 3.8 Creatinine 0.5 Glucose 428  ?Labs 09/10/20: Pregnancy Test negative.  ?Labs (6/22): SCr 0.93, K 4 ?Labs (10/22): SCr 0.68, K 4.4  ?Labs (12/22): K 4.4, creatinine 0.66 ? ?PMH: ?1. Type 2 diabetes: H/o DKA ?2. HTN ?3. OSA ?4. Obesity ?5. Polycystic ovarian syndrome ?6. Chronic systolic CHF: Nonischemic cardiomyopathy ?- Echo (6/20) with EF 20-25%, moderate RV dysfunction.  ?- LHC/RHC (9/20): No significant CAD.  ?- Echo (3/21): EF <20%, moderate LVH with global HK, severely reduced RV systolic function.  ?- Echo (6/21): EF 35%, moderately decreased RV systolic function, moderate LAE, mild MR. ?- Echo (11/21): EF 35-40%, global hypokinesis, mild LVH, normal RV, IVC normal.  ?7. Hyperlipidemia ?8. Atrial fibrillation: Paroxysmal.  ?9.  History of LV thrombus.  ? ? ?Current Outpatient Medications  ?Medication Sig Dispense Refill  ? Accu-Chek Softclix Lancets lancets Use as instructed 100 each 12  ? albuterol (VENTOLIN HFA) 108 (90 Base) MCG/ACT inhaler Inhale 2 puffs into the lungs every 6 (six) hours as needed for wheezing or shortness of breath. 18 g 1  ? atorvastatin (LIPITOR) 40 MG  tablet TAKE 1 TABLET BY MOUTH DAILY AT 6 PM. 30 tablet 2  ? blood glucose meter kit and supplies KIT Dispense based on patient and insurance preference. Use up to four times daily as directed. (FOR ICD-9 250.00, 250.01). 1 each 0  ? Blood Glucose Monitoring Suppl (ACCU-CHEK AVIVA PLUS) w/Device KIT 1 application by Does not apply route QID. Check fasting sugars as well as throughout the day 1 kit 0  ? busPIRone (BUSPAR) 7.5 MG tablet Take 7.5 mg by mouth 2 (two) times daily.    ? carvedilol (COREG) 6.25 MG tablet Take 1 tablet (6.25 mg total) by mouth 2 (two) times daily with a meal. 60 tablet 1  ? digoxin (LANOXIN) 0.125 MG tablet Take 1 tablet (0.125 mg total) by mouth daily. 30 tablet 3  ? empagliflozin (JARDIANCE) 25 MG TABS tablet Take 25 mg by mouth daily. 90 tablet 3  ? gabapentin (NEURONTIN) 100 MG capsule Take 100 mg by mouth 2 (two) times daily.    ? glucose blood (ACCU-CHEK AVIVA PLUS) test strip Check CBG 2 times per day 100 each 12  ? glucose blood (AGAMATRIX PRESTO TEST) test strip Use as instructed 100 each 12  ? hydrOXYzine (ATARAX) 10 MG tablet Take 1 tablet (10 mg total) by mouth 3 (three) times daily as needed. 30 tablet 0  ? insulin aspart (NOVOLOG FLEXPEN) 100 UNIT/ML FlexPen Inject 12 Units into the skin daily before supper. 15 mL 11  ? insulin glargine (LANTUS) 100 UNIT/ML Solostar Pen Inject 36 Units into the skin every morning. 15 mL 11  ? Insulin Pen Needle (UNIFINE PENTIPS) 31G X 5 MM MISC USE AS DIRECTED 2 TIMES DAILY 100 each 11  ? Insulin Syringe-Needle U-100 (INSULIN SYRINGE .3CC/31GX5/16") 31G X 5/16" 0.3 ML MISC 15 Units by Does not apply route 2 (two) times daily. 100 each 3  ? Lancets Misc. (ACCU-CHEK SOFTCLIX LANCET DEV) KIT 1 application by Does not apply route 2 (two) times daily. 1 kit 0  ? meclizine (ANTIVERT) 12.5 MG tablet Take 1 tablet (12.5 mg total) by mouth 3 (three) times daily as needed for dizziness. 30 tablet 0  ? potassium chloride SA (KLOR-CON M) 20 MEQ tablet Take  1 tablet (20 mEq total) by mouth daily. 30 tablet 1  ? sacubitril-valsartan (ENTRESTO) 49-51 MG Take 1 tablet by mouth 2 (two) times daily. 60 tablet 1  ? sertraline (ZOLOFT) 25 MG tablet Take 1 tablet (25 mg total) by mouth daily. 30 tablet 3  ? spironolactone (ALDACTONE) 25 MG tablet Take 25 mg by mouth daily.    ? torsemide (DEMADEX) 20 MG tablet Take 2 tablets (40 mg total) by mouth 2 (two) times daily. 120 tablet 1  ? traZODone (DESYREL) 50 MG tablet Take 1 tablet (50 mg total) by mouth at bedtime as needed for sleep. 30 tablet 0  ? warfarin (COUMADIN) 7.5 MG tablet Take 1 and 1/2 tablets (11.25 mg total) by mouth one time only at 4 PM. 45 tablet 1  ? ?No current facility-administered medications for this visit.  ? ?Allergies  ?Allergen Reactions  ? Shellfish Allergy Swelling  ?  Airway involvement including EMS - Has Epi-Pen  ? Dulaglutide Nausea And Vomiting and Other (See Comments)  ?  Multiple episodes of vomiting over multiple days  ? Metformin And Related Diarrhea  ? Ibuprofen Other (See Comments)  ?  Per PCP interferes with daily meds (heart failure)  ? ?Social History  ? ?Socioeconomic History  ? Marital status: Single  ?  Spouse name: Not on file  ? Number of children: Not on file  ? Years of education: Not on file  ? Highest education level: Not on file  ?Occupational History  ? Not on file  ?Tobacco Use  ? Smoking status: Never  ? Smokeless tobacco: Never  ?Vaping Use  ? Vaping Use: Never used  ?Substance and Sexual Activity  ? Alcohol use: No  ?  Alcohol/week: 0.0 standard drinks  ? Drug use: No  ? Sexual activity: Yes  ?Other Topics Concern  ? Not on file  ?Social History Narrative  ? Not on file  ? ?Social Determinants of Health  ? ?Financial Resource Strain: Not on file  ?Food Insecurity: Not on file  ?Transportation Needs: Unmet Transportation Needs  ? Lack of Transportation (Medical): Yes  ? Lack of Transportation (Non-Medical): No  ?Physical Activity: Not on file  ?Stress: Not on file  ?Social  Connections: Not on file  ?Intimate Partner Violence: Not on file  ? ?  ?Family History  ?Problem Relation Age of Onset  ? Diabetes Mother   ? Hypertension Mother   ? Hypertension Father   ? Diabetes Fat

## 2021-10-07 ENCOUNTER — Telehealth (HOSPITAL_COMMUNITY): Payer: Self-pay

## 2021-10-07 NOTE — Telephone Encounter (Signed)
Called and left patient a voice message  to confirm/remind patient of their appointment at the [facility] on 10/08/21.  ? ? ?

## 2021-10-08 ENCOUNTER — Encounter (HOSPITAL_COMMUNITY): Payer: Self-pay

## 2021-10-17 ENCOUNTER — Emergency Department (HOSPITAL_COMMUNITY): Payer: Medicaid Other

## 2021-10-17 ENCOUNTER — Observation Stay (HOSPITAL_COMMUNITY)
Admission: EM | Admit: 2021-10-17 | Discharge: 2021-10-19 | DRG: 291 | Disposition: A | Discharge status: Home / Self Care | Payer: Medicaid Other | Attending: Family Medicine | Admitting: Family Medicine

## 2021-10-17 ENCOUNTER — Encounter (HOSPITAL_COMMUNITY): Payer: Self-pay

## 2021-10-17 ENCOUNTER — Other Ambulatory Visit: Payer: Self-pay

## 2021-10-17 DIAGNOSIS — I2510 Atherosclerotic heart disease of native coronary artery without angina pectoris: Secondary | ICD-10-CM | POA: Diagnosis not present

## 2021-10-17 DIAGNOSIS — F419 Anxiety disorder, unspecified: Secondary | ICD-10-CM | POA: Diagnosis present

## 2021-10-17 DIAGNOSIS — Z9114 Patient's other noncompliance with medication regimen: Secondary | ICD-10-CM | POA: Diagnosis not present

## 2021-10-17 DIAGNOSIS — M546 Pain in thoracic spine: Secondary | ICD-10-CM | POA: Diagnosis present

## 2021-10-17 DIAGNOSIS — E1169 Type 2 diabetes mellitus with other specified complication: Secondary | ICD-10-CM | POA: Diagnosis present

## 2021-10-17 DIAGNOSIS — Z79899 Other long term (current) drug therapy: Secondary | ICD-10-CM

## 2021-10-17 DIAGNOSIS — Z8249 Family history of ischemic heart disease and other diseases of the circulatory system: Secondary | ICD-10-CM

## 2021-10-17 DIAGNOSIS — R000 Tachycardia, unspecified: Secondary | ICD-10-CM

## 2021-10-17 DIAGNOSIS — I152 Hypertension secondary to endocrine disorders: Secondary | ICD-10-CM | POA: Diagnosis present

## 2021-10-17 DIAGNOSIS — Z7901 Long term (current) use of anticoagulants: Secondary | ICD-10-CM

## 2021-10-17 DIAGNOSIS — I160 Hypertensive urgency: Secondary | ICD-10-CM | POA: Diagnosis not present

## 2021-10-17 DIAGNOSIS — Z833 Family history of diabetes mellitus: Secondary | ICD-10-CM

## 2021-10-17 DIAGNOSIS — Z20822 Contact with and (suspected) exposure to covid-19: Secondary | ICD-10-CM | POA: Diagnosis present

## 2021-10-17 DIAGNOSIS — D649 Anemia, unspecified: Secondary | ICD-10-CM | POA: Diagnosis not present

## 2021-10-17 DIAGNOSIS — Z66 Do not resuscitate: Secondary | ICD-10-CM | POA: Diagnosis not present

## 2021-10-17 DIAGNOSIS — E785 Hyperlipidemia, unspecified: Secondary | ICD-10-CM | POA: Diagnosis present

## 2021-10-17 DIAGNOSIS — J9811 Atelectasis: Secondary | ICD-10-CM | POA: Diagnosis not present

## 2021-10-17 DIAGNOSIS — I480 Paroxysmal atrial fibrillation: Secondary | ICD-10-CM | POA: Diagnosis not present

## 2021-10-17 DIAGNOSIS — E876 Hypokalemia: Secondary | ICD-10-CM | POA: Diagnosis present

## 2021-10-17 DIAGNOSIS — G2581 Restless legs syndrome: Secondary | ICD-10-CM | POA: Diagnosis present

## 2021-10-17 DIAGNOSIS — I272 Pulmonary hypertension, unspecified: Secondary | ICD-10-CM | POA: Diagnosis present

## 2021-10-17 DIAGNOSIS — Z91013 Allergy to seafood: Secondary | ICD-10-CM

## 2021-10-17 DIAGNOSIS — I517 Cardiomegaly: Secondary | ICD-10-CM | POA: Diagnosis not present

## 2021-10-17 DIAGNOSIS — I428 Other cardiomyopathies: Secondary | ICD-10-CM | POA: Diagnosis not present

## 2021-10-17 DIAGNOSIS — F32A Depression, unspecified: Secondary | ICD-10-CM | POA: Diagnosis not present

## 2021-10-17 DIAGNOSIS — D72829 Elevated white blood cell count, unspecified: Secondary | ICD-10-CM | POA: Diagnosis not present

## 2021-10-17 DIAGNOSIS — F339 Major depressive disorder, recurrent, unspecified: Secondary | ICD-10-CM

## 2021-10-17 DIAGNOSIS — E1165 Type 2 diabetes mellitus with hyperglycemia: Secondary | ICD-10-CM

## 2021-10-17 DIAGNOSIS — I5082 Biventricular heart failure: Secondary | ICD-10-CM | POA: Diagnosis not present

## 2021-10-17 DIAGNOSIS — R079 Chest pain, unspecified: Secondary | ICD-10-CM | POA: Diagnosis not present

## 2021-10-17 DIAGNOSIS — J811 Chronic pulmonary edema: Secondary | ICD-10-CM | POA: Diagnosis not present

## 2021-10-17 DIAGNOSIS — I509 Heart failure, unspecified: Secondary | ICD-10-CM

## 2021-10-17 DIAGNOSIS — G4733 Obstructive sleep apnea (adult) (pediatric): Secondary | ICD-10-CM | POA: Diagnosis present

## 2021-10-17 DIAGNOSIS — Z6837 Body mass index (BMI) 37.0-37.9, adult: Secondary | ICD-10-CM

## 2021-10-17 DIAGNOSIS — Z794 Long term (current) use of insulin: Secondary | ICD-10-CM

## 2021-10-17 DIAGNOSIS — I110 Hypertensive heart disease with heart failure: Principal | ICD-10-CM | POA: Diagnosis present

## 2021-10-17 DIAGNOSIS — I5033 Acute on chronic diastolic (congestive) heart failure: Secondary | ICD-10-CM | POA: Diagnosis not present

## 2021-10-17 DIAGNOSIS — Z888 Allergy status to other drugs, medicaments and biological substances status: Secondary | ICD-10-CM

## 2021-10-17 HISTORY — DX: Heart failure, unspecified: I50.9

## 2021-10-17 LAB — BASIC METABOLIC PANEL
Anion gap: 8 (ref 5–15)
BUN: 10 mg/dL (ref 6–20)
CO2: 21 mmol/L — ABNORMAL LOW (ref 22–32)
Calcium: 8.7 mg/dL — ABNORMAL LOW (ref 8.9–10.3)
Chloride: 105 mmol/L (ref 98–111)
Creatinine, Ser: 0.84 mg/dL (ref 0.44–1.00)
GFR, Estimated: >60 mL/min (ref >60)
Glucose, Bld: 252 mg/dL — ABNORMAL HIGH (ref 70–99)
Potassium: 3.7 mmol/L (ref 3.5–5.1)
Sodium: 134 mmol/L — ABNORMAL LOW (ref 135–145)

## 2021-10-17 LAB — CBC
HCT: 34.4 % — ABNORMAL LOW (ref 36.0–46.0)
Hemoglobin: 10.6 g/dL — ABNORMAL LOW (ref 12.0–15.0)
MCH: 23.8 pg — ABNORMAL LOW (ref 26.0–34.0)
MCHC: 30.8 g/dL (ref 30.0–36.0)
MCV: 77.3 fL — ABNORMAL LOW (ref 80.0–100.0)
Platelets: 345 10*3/uL (ref 150–400)
RBC: 4.45 MIL/uL (ref 3.87–5.11)
RDW: 17.1 % — ABNORMAL HIGH (ref 11.5–15.5)
WBC: 12.6 10*3/uL — ABNORMAL HIGH (ref 4.0–10.5)
nRBC: 0 % (ref 0.0–0.2)

## 2021-10-17 LAB — TROPONIN I (HIGH SENSITIVITY): Troponin I (High Sensitivity): 13 ng/L (ref <18)

## 2021-10-17 LAB — I-STAT BETA HCG BLOOD, ED (MC, WL, AP ONLY): I-stat hCG, quantitative: <5 m[IU]/mL (ref <5)

## 2021-10-17 MED ORDER — SACUBITRIL-VALSARTAN 49-51 MG PO TABS
1.0000 | ORAL_TABLET | Freq: Two times a day (BID) | ORAL | Status: DC
  Administered 2021-10-17 – 2021-10-18 (×2): 1 via ORAL
  Filled 2021-10-17 (×3): qty 1

## 2021-10-17 MED ORDER — INSULIN GLARGINE-YFGN 100 UNIT/ML ~~LOC~~ SOLN
18.0000 [IU] | Freq: Every day | SUBCUTANEOUS | Status: DC
  Administered 2021-10-18 – 2021-10-19 (×2): 18 [IU] via SUBCUTANEOUS
  Filled 2021-10-17 (×3): qty 0.18

## 2021-10-17 MED ORDER — SERTRALINE HCL 50 MG PO TABS
50.0000 mg | ORAL_TABLET | Freq: Every day | ORAL | Status: DC
  Administered 2021-10-18 – 2021-10-19 (×2): 50 mg via ORAL
  Filled 2021-10-17 (×2): qty 1

## 2021-10-17 MED ORDER — INSULIN ASPART 100 UNIT/ML IJ SOLN
0.0000 [IU] | Freq: Three times a day (TID) | INTRAMUSCULAR | Status: DC
  Administered 2021-10-18: 5 [IU] via SUBCUTANEOUS
  Administered 2021-10-18: 3 [IU] via SUBCUTANEOUS
  Administered 2021-10-18 – 2021-10-19 (×2): 5 [IU] via SUBCUTANEOUS
  Administered 2021-10-19: 8 [IU] via SUBCUTANEOUS

## 2021-10-17 MED ORDER — EMPAGLIFLOZIN 25 MG PO TABS
25.0000 mg | ORAL_TABLET | Freq: Every day | ORAL | Status: DC
  Administered 2021-10-18 – 2021-10-19 (×2): 25 mg via ORAL
  Filled 2021-10-17 (×2): qty 1

## 2021-10-17 MED ORDER — WARFARIN SODIUM 10 MG PO TABS: 11.2500 mg | ORAL_TABLET | Freq: Once | ORAL | Status: DC

## 2021-10-17 MED ORDER — LORAZEPAM 2 MG/ML IJ SOLN
1.0000 mg | Freq: Once | INTRAMUSCULAR | Status: AC
  Administered 2021-10-17: 1 mg via INTRAVENOUS
  Filled 2021-10-17: qty 1

## 2021-10-17 MED ORDER — CARVEDILOL 6.25 MG PO TABS
6.2500 mg | ORAL_TABLET | Freq: Two times a day (BID) | ORAL | Status: DC
  Administered 2021-10-18 – 2021-10-19 (×3): 6.25 mg via ORAL
  Filled 2021-10-17 (×4): qty 1

## 2021-10-17 MED ORDER — BUSPIRONE HCL 5 MG PO TABS
7.5000 mg | ORAL_TABLET | Freq: Two times a day (BID) | ORAL | Status: DC
  Administered 2021-10-17 – 2021-10-19 (×4): 7.5 mg via ORAL
  Filled 2021-10-17: qty 2
  Filled 2021-10-17: qty 1
  Filled 2021-10-17 (×2): qty 2

## 2021-10-17 MED ORDER — LABETALOL HCL 5 MG/ML IV SOLN
10.0000 mg | Freq: Once | INTRAVENOUS | Status: DC
Start: 2021-10-17 — End: 2021-10-17

## 2021-10-17 MED ORDER — GABAPENTIN 100 MG PO CAPS
100.0000 mg | ORAL_CAPSULE | Freq: Two times a day (BID) | ORAL | Status: DC
  Administered 2021-10-17 – 2021-10-19 (×4): 100 mg via ORAL
  Filled 2021-10-17 (×4): qty 1

## 2021-10-17 MED ORDER — ATORVASTATIN CALCIUM 40 MG PO TABS
40.0000 mg | ORAL_TABLET | Freq: Every day | ORAL | Status: DC
  Administered 2021-10-18 – 2021-10-19 (×2): 40 mg via ORAL
  Filled 2021-10-17 (×2): qty 1

## 2021-10-17 MED ORDER — ALBUTEROL SULFATE (2.5 MG/3ML) 0.083% IN NEBU: 3.0000 mL | INHALATION_SOLUTION | Freq: Four times a day (QID) | RESPIRATORY_TRACT | Status: DC | PRN

## 2021-10-17 MED ORDER — HYDROXYZINE HCL 10 MG PO TABS
10.0000 mg | ORAL_TABLET | Freq: Three times a day (TID) | ORAL | Status: DC | PRN
  Administered 2021-10-17 – 2021-10-19 (×2): 10 mg via ORAL
  Filled 2021-10-17 (×3): qty 1

## 2021-10-17 MED ORDER — FUROSEMIDE 10 MG/ML IJ SOLN
40.0000 mg | Freq: Once | INTRAMUSCULAR | Status: AC
  Administered 2021-10-17: 40 mg via INTRAVENOUS
  Filled 2021-10-17: qty 4

## 2021-10-17 MED ORDER — TRAZODONE HCL 50 MG PO TABS
50.0000 mg | ORAL_TABLET | Freq: Every evening | ORAL | Status: DC | PRN
  Administered 2021-10-18: 50 mg via ORAL
  Filled 2021-10-17: qty 1
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
## 2021-10-17 NOTE — ED Notes (Signed)
This RN attempted IV x 1. Pt requesting IV team after 1 unsuccessful attempt ?

## 2021-10-17 NOTE — ED Notes (Signed)
IV team at pt bedside 

## 2021-10-17 NOTE — ED Provider Notes (Signed)
?[facility] ?Provider Note ? ? ?CSN: [id number] ?Arrival date & time: 10/17/21  1907 ? ?  ? ?History ? ?Chief Complaint  ?Patient presents with  ? Chest Pain  ? ? ?[name] is a 46 y.o. female. ? ?Pt is a 46 yo with pmhx of htn, PCOS, dm, anemia, depression, morbid obesity, chf, afib on coumadin, nonischemic cardiomyopathy.  Pt's EF on last ECHO was 20% on 2/25.  Pt was admitted from 2/24-3/1 for the same.  The said she has been taking her meds.  She has an appt on Friday (3/31) with the CHF clinic, but does not think she can make it that long.  She said she can't walk without getting sob.  She can't lay back at all without getting sob.  Pt denies any f/c.  No n/v. Pt also said she's been very anxious and depressed about her poor health.  She has been crying a lot. ? ? ?  ? ?Home Medications ?Prior to Admission medications   ?Medication Sig Start Date End Date Taking? Authorizing Provider  ?albuterol (VENTOLIN HFA) 108 (90 Base) MCG/ACT inhaler Inhale 2 puffs into the lungs every 6 (six) hours as needed for wheezing or shortness of breath. 09/16/21 09/16/22 Yes Lilland, Alana, DO  ?atorvastatin (LIPITOR) 40 MG tablet TAKE 1 TABLET BY MOUTH DAILY AT 6 PM. ?Patient taking differently: Take 40 mg by mouth daily. 04/01/20 08/27/22 Yes Larey Dresser, MD  ?busPIRone (BUSPAR) 7.5 MG tablet Take 7.5 mg by mouth 2 (two) times daily. 06/03/20  Yes [provider]  ?carvedilol (COREG) 6.25 MG tablet Take 1 tablet (6.25 mg total) by mouth 2 (two) times daily with a meal. 09/22/21  Yes Gladys Damme, MD  ?digoxin (LANOXIN) 0.125 MG tablet Take 1 tablet (0.125 mg total) by mouth daily. 11/15/19  Yes Lyda Jester M, PA-C  ?empagliflozin (JARDIANCE) 25 MG TABS tablet Take 25 mg by mouth daily. 10/03/19  Yes Pokhrel, Laxman, MD  ?gabapentin (NEURONTIN) 100 MG capsule Take 100 mg by mouth 2 (two) times daily.   Yes [provider]  ?hydrOXYzine (ATARAX) 10 MG tablet  Take 1 tablet (10 mg total) by mouth 3 (three) times daily as needed. ?Patient taking differently: Take 10 mg by mouth 3 (three) times daily as needed for anxiety. 08/10/21  Yes Gifford Shave, MD  ?insulin aspart (NOVOLOG FLEXPEN) 100 UNIT/ML FlexPen Inject 12 Units into the skin daily before supper. 09/02/21  Yes Hensel, Jamal Collin, MD  ?insulin glargine (LANTUS) 100 UNIT/ML Solostar Pen Inject 36 Units into the skin every morning. 09/02/21  Yes Hensel, Jamal Collin, MD  ?potassium chloride SA (KLOR-CON M) 20 MEQ tablet Take 1 tablet (20 mEq total) by mouth daily. 09/22/21  Yes Gladys Damme, MD  ?sacubitril-valsartan (ENTRESTO) 49-51 MG Take 1 tablet by mouth 2 (two) times daily. 09/22/21 11/21/21 Yes Gladys Damme, MD  ?sertraline (ZOLOFT) 25 MG tablet Take 1 tablet (25 mg total) by mouth daily. 08/31/21  Yes Gifford Shave, MD  ?spironolactone (ALDACTONE) 25 MG tablet Take 25 mg by mouth daily.   Yes [provider]  ?torsemide (DEMADEX) 20 MG tablet Take 2 tablets (40 mg total) by mouth 2 (two) times daily. 09/22/21  Yes Gladys Damme, MD  ?traZODone (DESYREL) 50 MG tablet Take 1 tablet (50 mg total) by mouth at bedtime as needed for sleep. 08/10/21  Yes Gifford Shave, MD  ?warfarin (COUMADIN) 7.5 MG tablet Take 1 and 1/2 tablets (11.25 mg total) by mouth  one time only at 4 PM. 09/22/21  Yes Gladys Damme, MD  ?Accu-Chek Softclix Lancets lancets Use as instructed 02/12/19   Caroline More, DO  ?blood glucose meter kit and supplies KIT Dispense based on patient and insurance preference. Use up to four times daily as directed. (FOR ICD-9 250.00, 250.01). 09/27/21   Autry-Lott, Naaman Plummer, DO  ?Blood Glucose Monitoring Suppl (ACCU-CHEK AVIVA PLUS) w/Device KIT 1 application by Does not apply route QID. Check fasting sugars as well as throughout the day 02/12/19   Caroline More, DO  ?glucose blood (ACCU-CHEK AVIVA PLUS) test strip Check CBG 2 times per day 08/16/19   [name], DO  ?glucose blood (AGAMATRIX  PRESTO TEST) test strip Use as instructed 10/23/17   Zenia Resides, MD  ?Insulin Pen Needle (UNIFINE PENTIPS) 31G X 5 MM MISC USE AS DIRECTED 2 TIMES DAILY 09/02/21   Zenia Resides, MD  ?Insulin Syringe-Needle U-100 (INSULIN SYRINGE .3CC/31GX5/16") 31G X 5/16" 0.3 ML MISC 15 Units by Does not apply route 2 (two) times daily. 09/05/20   Rai, Vernelle Emerald, MD  ?Lancets Misc. (ACCU-CHEK SOFTCLIX LANCET DEV) KIT 1 application by Does not apply route 2 (two) times daily. 08/16/19   [name], DO  ?meclizine (ANTIVERT) 12.5 MG tablet Take 1 tablet (12.5 mg total) by mouth 3 (three) times daily as needed for dizziness. ?Patient not taking: Reported on 10/17/2021 09/19/19   [name], DO  ?   ? ?Allergies    ?Shellfish allergy, Dulaglutide, Metformin and related, and Ibuprofen   ? ?Review of Systems   ?Review of Systems  ?Respiratory:  Positive for shortness of breath.   ?Cardiovascular:  Positive for leg swelling.  ?All other systems reviewed and are negative. ? ?Physical Exam ?Updated Vital Signs ?BP 111/83 (BP Location: Right Arm)   Pulse 89   Temp 97.6 ?F (36.4 ?C) (Oral)   Resp (!) 28   Ht $R'4\' 11"'ga$  (1.499 m)   Wt 85 kg   SpO2 97%   BMI 37.85 kg/m?  ?Physical Exam ?Vitals and nursing note reviewed.  ?Constitutional:   ?   General: She is in acute distress.  ?   Appearance: She is well-developed. She is obese. She is ill-appearing.  ?HENT:  ?   Head: Normocephalic and atraumatic.  ?Eyes:  ?   Extraocular Movements: Extraocular movements intact.  ?   Pupils: Pupils are equal, round, and reactive to light.  ?Cardiovascular:  ?   Rate and Rhythm: Regular rhythm. Tachycardia present.  ?   Heart sounds: Normal heart sounds.  ?Pulmonary:  ?   Effort: Tachypnea present.  ?   Breath sounds: Rhonchi present.  ?Abdominal:  ?   General: Bowel sounds are normal.  ?   Palpations: Abdomen is soft.  ?Musculoskeletal:     ?   General: Normal range of motion.  ?   Cervical back: Normal range of motion and neck supple.  ?   Right  lower leg: Edema present.  ?   Left lower leg: Edema present.  ?Skin: ?   General: Skin is warm.  ?   Capillary Refill: Capillary refill takes less than 2 seconds.  ?Neurological:  ?   General: No focal deficit present.  ?   Mental Status: She is alert and oriented to person, place, and time.  ?Psychiatric:     ?   Mood and Affect: Mood is anxious.     ?   Behavior: Behavior normal.  ? ? ?ED Results /  Procedures / Treatments   ?Labs ?(all labs ordered are listed, but only abnormal results are displayed) ?Labs Reviewed  ?BASIC METABOLIC PANEL - Abnormal; Notable for the following components:  ?    Result Value  ? Sodium 134 (*)   ? CO2 21 (*)   ? Glucose, Bld 252 (*)   ? Calcium 8.7 (*)   ? All other components within normal limits  ?CBC - Abnormal; Notable for the following components:  ? WBC 12.6 (*)   ? Hemoglobin 10.6 (*)   ? HCT 34.4 (*)   ? MCV 77.3 (*)   ? MCH 23.8 (*)   ? RDW 17.1 (*)   ? All other components within normal limits  ?CBC - Abnormal; Notable for the following components:  ? WBC 12.6 (*)   ? Hemoglobin 11.3 (*)   ? HCT 35.1 (*)   ? MCV 73.9 (*)   ? MCH 23.8 (*)   ? RDW 16.8 (*)   ? All other components within normal limits  ?BASIC METABOLIC PANEL - Abnormal; Notable for the following components:  ? CO2 20 (*)   ? Glucose, Bld 229 (*)   ? All other components within normal limits  ?BRAIN NATRIURETIC PEPTIDE - Abnormal; Notable for the following components:  ? B Natriuretic Peptide 428.0 (*)   ? All other components within normal limits  ?DIGOXIN LEVEL - Abnormal; Notable for the following components:  ? Digoxin Level <0.2 (*)   ? All other components within normal limits  ?BLOOD GAS, VENOUS - Abnormal; Notable for the following components:  ? pH, Ven 7.48 (*)   ? pCO2, Ven 29 (*)   ? pO2, Ven 73 (*)   ? All other components within normal limits  ?GLUCOSE, CAPILLARY - Abnormal; Notable for the following components:  ? Glucose-Capillary 222 (*)   ? All other components within normal limits   ?URINALYSIS, ROUTINE W REFLEX MICROSCOPIC - Abnormal; Notable for the following components:  ? Color, Urine STRAW (*)   ? APPearance HAZY (*)   ? Glucose, UA >=500 (*)   ? Bacteria, UA RARE (*)   ? All other components

## 2021-10-17 NOTE — ED Triage Notes (Signed)
Pt arrives to ED POV c/o CP and SHOB that started today. Pt states "I think I had a mini heart attack because the pain is too much and goes to my back" Hx of CHF. No distress noted at this time. A/O x4. ?

## 2021-10-18 ENCOUNTER — Other Ambulatory Visit (HOSPITAL_COMMUNITY): Payer: Self-pay

## 2021-10-18 DIAGNOSIS — I480 Paroxysmal atrial fibrillation: Secondary | ICD-10-CM

## 2021-10-18 DIAGNOSIS — I5043 Acute on chronic combined systolic (congestive) and diastolic (congestive) heart failure: Secondary | ICD-10-CM

## 2021-10-18 DIAGNOSIS — E1165 Type 2 diabetes mellitus with hyperglycemia: Secondary | ICD-10-CM

## 2021-10-18 DIAGNOSIS — R000 Tachycardia, unspecified: Secondary | ICD-10-CM | POA: Diagnosis not present

## 2021-10-18 DIAGNOSIS — I509 Heart failure, unspecified: Secondary | ICD-10-CM

## 2021-10-18 LAB — BASIC METABOLIC PANEL
Anion gap: 10 (ref 5–15)
BUN: 10 mg/dL (ref 6–20)
CO2: 20 mmol/L — ABNORMAL LOW (ref 22–32)
Calcium: 9 mg/dL (ref 8.9–10.3)
Chloride: 105 mmol/L (ref 98–111)
Creatinine, Ser: 0.73 mg/dL (ref 0.44–1.00)
GFR, Estimated: >60 mL/min (ref >60)
Glucose, Bld: 229 mg/dL — ABNORMAL HIGH (ref 70–99)
Potassium: 3.7 mmol/L (ref 3.5–5.1)
Sodium: 135 mmol/L (ref 135–145)

## 2021-10-18 LAB — BRAIN NATRIURETIC PEPTIDE: B Natriuretic Peptide: 428 pg/mL — ABNORMAL HIGH (ref 0.0–100.0)

## 2021-10-18 LAB — CBC
HCT: 35.1 % — ABNORMAL LOW (ref 36.0–46.0)
Hemoglobin: 11.3 g/dL — ABNORMAL LOW (ref 12.0–15.0)
MCH: 23.8 pg — ABNORMAL LOW (ref 26.0–34.0)
MCHC: 32.2 g/dL (ref 30.0–36.0)
MCV: 73.9 fL — ABNORMAL LOW (ref 80.0–100.0)
Platelets: 358 10*3/uL (ref 150–400)
RBC: 4.75 MIL/uL (ref 3.87–5.11)
RDW: 16.8 % — ABNORMAL HIGH (ref 11.5–15.5)
WBC: 12.6 10*3/uL — ABNORMAL HIGH (ref 4.0–10.5)
nRBC: 0 % (ref 0.0–0.2)

## 2021-10-18 LAB — BLOOD GAS, VENOUS
Acid-base deficit: 0.8 mmol/L (ref 0.0–2.0)
Bicarbonate: 21.6 mmol/L (ref 20.0–28.0)
O2 Saturation: 96.2 %
Patient temperature: 36.9
pCO2, Ven: 29 mmHg — ABNORMAL LOW (ref 44–60)
pH, Ven: 7.48 — ABNORMAL HIGH (ref 7.25–7.43)
pO2, Ven: 73 mmHg — ABNORMAL HIGH (ref 32–45)

## 2021-10-18 LAB — URINALYSIS, ROUTINE W REFLEX MICROSCOPIC
Bilirubin Urine: NEGATIVE
Glucose, UA: >=500 mg/dL — AB
Hgb urine dipstick: NEGATIVE
Ketones, ur: NEGATIVE mg/dL
Leukocytes,Ua: NEGATIVE
Nitrite: NEGATIVE
Protein, ur: NEGATIVE mg/dL
Specific Gravity, Urine: 1.01 (ref 1.005–1.030)
pH: 7 (ref 5.0–8.0)

## 2021-10-18 LAB — GLUCOSE, CAPILLARY
Glucose-Capillary: 222 mg/dL — ABNORMAL HIGH (ref 70–99)
Glucose-Capillary: 205 mg/dL — ABNORMAL HIGH (ref 70–99)
Glucose-Capillary: 155 mg/dL — ABNORMAL HIGH (ref 70–99)
Glucose-Capillary: 139 mg/dL — ABNORMAL HIGH (ref 70–99)

## 2021-10-18 LAB — RESP PANEL BY RT-PCR (FLU A&B, COVID) ARPGX2
Influenza A by PCR: NEGATIVE
Influenza B by PCR: NEGATIVE
SARS Coronavirus 2 by RT PCR: NEGATIVE

## 2021-10-18 LAB — PROTIME-INR
INR: 1 (ref 0.8–1.2)
Prothrombin Time: 12.8 seconds (ref 11.4–15.2)

## 2021-10-18 LAB — RAPID URINE DRUG SCREEN, HOSP PERFORMED
Amphetamines: NOT DETECTED
Barbiturates: NOT DETECTED
Benzodiazepines: NOT DETECTED
Cocaine: NOT DETECTED
Opiates: NOT DETECTED
Tetrahydrocannabinol: NOT DETECTED

## 2021-10-18 LAB — MAGNESIUM: Magnesium: 1.7 mg/dL (ref 1.7–2.4)

## 2021-10-18 LAB — DIGOXIN LEVEL: Digoxin Level: <0.2 ng/mL — ABNORMAL LOW (ref 0.8–2.0)

## 2021-10-18 LAB — TROPONIN I (HIGH SENSITIVITY): Troponin I (High Sensitivity): 14 ng/L (ref <18)

## 2021-10-18 LAB — D-DIMER, QUANTITATIVE: D-Dimer, Quant: 0.9 ug/mL-FEU — ABNORMAL HIGH (ref 0.00–0.50)

## 2021-10-18 LAB — PHOSPHORUS: Phosphorus: 3.1 mg/dL (ref 2.5–4.6)

## 2021-10-18 MED ORDER — RIVAROXABAN 20 MG PO TABS
20.0000 mg | ORAL_TABLET | Freq: Every day | ORAL | Status: DC
  Administered 2021-10-18 – 2021-10-19 (×2): 20 mg via ORAL
  Filled 2021-10-18 (×2): qty 1

## 2021-10-18 MED ORDER — DIGOXIN 125 MCG PO TABS
0.1250 mg | ORAL_TABLET | Freq: Every day | ORAL | Status: DC
  Administered 2021-10-18 – 2021-10-19 (×2): 0.125 mg via ORAL
  Filled 2021-10-18 (×2): qty 1

## 2021-10-18 MED ORDER — MORPHINE SULFATE (PF) 2 MG/ML IV SOLN
1.0000 mg | Freq: Two times a day (BID) | INTRAVENOUS | Status: DC | PRN
  Administered 2021-10-18 – 2021-10-19 (×2): 1 mg via INTRAVENOUS
  Filled 2021-10-18 (×2): qty 1

## 2021-10-18 MED ORDER — POTASSIUM CHLORIDE CRYS ER 20 MEQ PO TBCR
40.0000 meq | EXTENDED_RELEASE_TABLET | Freq: Once | ORAL | Status: AC
  Administered 2021-10-18: 40 meq via ORAL
  Filled 2021-10-18: qty 2

## 2021-10-18 MED ORDER — SACUBITRIL-VALSARTAN 97-103 MG PO TABS
1.0000 | ORAL_TABLET | Freq: Two times a day (BID) | ORAL | Status: DC
  Administered 2021-10-18 – 2021-10-19 (×2): 1 via ORAL
  Filled 2021-10-18 (×3): qty 1

## 2021-10-18 MED ORDER — MAGNESIUM SULFATE 4 GM/100ML IV SOLN
4.0000 g | Freq: Once | INTRAVENOUS | Status: AC
  Administered 2021-10-18: 4 g via INTRAVENOUS
  Filled 2021-10-18: qty 100

## 2021-10-18 MED ORDER — FUROSEMIDE 10 MG/ML IJ SOLN
60.0000 mg | Freq: Once | INTRAMUSCULAR | Status: AC
  Administered 2021-10-18: 60 mg via INTRAVENOUS
  Filled 2021-10-18: qty 6

## 2021-10-18 MED ORDER — FUROSEMIDE 10 MG/ML IJ SOLN
60.0000 mg | Freq: Two times a day (BID) | INTRAMUSCULAR | Status: DC
  Administered 2021-10-18: 60 mg via INTRAVENOUS
  Filled 2021-10-18: qty 6

## 2021-10-18 MED ORDER — SPIRONOLACTONE 25 MG PO TABS
25.0000 mg | ORAL_TABLET | Freq: Every day | ORAL | Status: DC
Start: 2021-10-18 — End: 2021-10-19
  Administered 2021-10-18 – 2021-10-19 (×2): 25 mg via ORAL
  Filled 2021-10-18 (×2): qty 1

## 2021-10-18 MED ORDER — TORSEMIDE 20 MG PO TABS
40.0000 mg | ORAL_TABLET | Freq: Two times a day (BID) | ORAL | Status: DC
Start: 2021-10-18 — End: 2021-10-18

## 2021-10-18 MED ORDER — ACETAMINOPHEN 500 MG PO TABS: 1000.0000 mg | ORAL_TABLET | Freq: Four times a day (QID) | ORAL | Status: DC | PRN

## 2021-10-18 MED ORDER — ACETAMINOPHEN 325 MG PO TABS: 650.0000 mg | ORAL_TABLET | Freq: Four times a day (QID) | ORAL | Status: DC | PRN

## 2021-10-18 NOTE — Consult Note (Addendum)
?  ?Advanced Heart Failure Team Consult Note ? ? ?Primary Physician: Gifford Shave, MD ?PCP-Cardiologist:  Loralie Champagne, MD ? ?Reason for Consultation: acute on chronic systolic CHF ? ?HPI:   ? ?[name] is seen today for evaluation of acute on chronic systolic CHF at the request of Dr. Erin Hearing with Family Medicine. 46 y.o. female with history of chronic systolic CHF/NICM, hx nonobstructive CAD, hx LV thrombus, pAF, DM, HLD, obesity, OSA, noncompliance with medical therapy, PCOS.  ? ?She's had multiple admissions over the last few years with a/c CHF. Recurrent trouble with medication compliance.  ? ?Most recent admission 08/2021 with a/c CHF. Echo with EF 20%, GIII DD (restrictive), RV moderately reduced, mild MR/TR. R/LHC with mild CAD, elevated right and left filling pressures, pulmonary venous HTN, preserved CO, PAPi 1.5. cMRI LVEF 23%, RVEF 32%, no LGE. GDMT optimized. She missed HF clinic follow-up on 03/17. ? ?Presented to ED yesterday with 4 days of worsening dyspnea, orthopnea, abdominal bloating. Also endorses central chest pain and mid back pain. Pain in both locations is reproducible with direct palpation. At recent discharge she was sent home on Torsemide 40 mg BID but had been taking 20 mg 5 X daily. Has not taken any cardiac medications for 3 days prior to admit. She went to stay with her boyfriend last week. Could not maneuver stairs in her own home and complete ADLs d/t dyspnea. She was tachypneic and tachycardic on presentation. She was hypertensive with BP 150s/100s. Had not taken medications. Labs significant for Scr 0.84, CO2 21, K 3.7, Na 134, Hs troponin 13>14, Hgb 10.6, WBC 12.6, MCV 77, digoxin level < 0.2, BNP 428. ECG sinus tach 112 bpm. CXR with mild pulmonary vascular congestion. Given 40 mg lasix IV and admitted to cardiac progressive care under Family Medicine team. ? ?Has voided multiple X with IV lasix. No Is/Os charted. Tearful today. Overwhelmed by multiple medical  issues and poor QOL. ? ?Review of Systems: [y] = yes, '[ ]'  = no  ? ?General: Weight gain '[ ]' ; Weight loss '[ ]' ; Anorexia '[ ]' ; Fatigue '[ ]' ; Fever '[ ]' ; Chills '[ ]' ; Weakness '[ ]'   ?Cardiac: Chest pain/pressure '[ ]' ; Resting SOB [ Y]; Exertional SOB [Y]; Orthopnea [Y]; Pedal Edema '[ ]' ; Palpitations '[ ]' ; Syncope '[ ]' ; Presyncope '[ ]' ; Paroxysmal nocturnal dyspnea'[ ]'   ?Pulmonary: Cough '[ ]' ; Wheezing'[ ]' ; Hemoptysis'[ ]' ; Sputum '[ ]' ; Snoring '[ ]'   ?GI: Vomiting'[ ]' ; Dysphagia'[ ]' ; Melena'[ ]' ; Hematochezia '[ ]' ; Heartburn'[ ]' ; Abdominal pain '[ ]' ; Constipation '[ ]' ; Diarrhea '[ ]' ; BRBPR '[ ]'   ?GU: Hematuria'[ ]' ; Dysuria '[ ]' ; Nocturia'[ ]'   ?Vascular: Pain in legs with walking '[ ]' ; Pain in feet with lying flat '[ ]' ; Non-healing sores '[ ]' ; Stroke '[ ]' ; TIA '[ ]' ; Slurred speech '[ ]' ;  ?Neuro: Headaches'[ ]' ; Vertigo'[ ]' ; Seizures'[ ]' ; Paresthesias'[ ]' ;Blurred vision '[ ]' ; Diplopia '[ ]' ; Vision changes '[ ]'   ?Ortho/Skin: Arthritis '[ ]' ; Joint pain '[ ]' ; Muscle pain '[ ]' ; Joint swelling '[ ]' ; Back Pain '[ ]' ; Rash '[ ]'   ?Psych: Depression'[ ]' ; Anxiety'[ ]'   ?Heme: Bleeding problems '[ ]' ; Clotting disorders '[ ]' ; Anemia '[ ]'   ?Endocrine: Diabetes [ Y]; Thyroid dysfunction'[ ]'  ? ?Home Medications ?Prior to Admission medications   ?Medication Sig Start Date End Date Taking? Authorizing Provider  ?albuterol (VENTOLIN HFA) 108 (90 Base) MCG/ACT inhaler Inhale 2 puffs into the lungs every 6 (six) hours as needed for wheezing or shortness of breath. 09/16/21  09/16/22 Yes Lilland, Alana, DO  ?atorvastatin (LIPITOR) 40 MG tablet TAKE 1 TABLET BY MOUTH DAILY AT 6 PM. ?Patient taking differently: Take 40 mg by mouth daily. 04/01/20 08/27/22 Yes Larey Dresser, MD  ?busPIRone (BUSPAR) 7.5 MG tablet Take 7.5 mg by mouth 2 (two) times daily. 06/03/20  Yes [provider]  ?carvedilol (COREG) 6.25 MG tablet Take 1 tablet (6.25 mg total) by mouth 2 (two) times daily with a meal. 09/22/21  Yes Gladys Damme, MD  ?digoxin (LANOXIN) 0.125 MG tablet Take 1 tablet (0.125 mg total) by mouth  daily. 11/15/19  Yes Lyda Jester M, PA-C  ?empagliflozin (JARDIANCE) 25 MG TABS tablet Take 25 mg by mouth daily. 10/03/19  Yes Pokhrel, Laxman, MD  ?gabapentin (NEURONTIN) 100 MG capsule Take 100 mg by mouth 2 (two) times daily.   Yes [provider]  ?hydrOXYzine (ATARAX) 10 MG tablet Take 1 tablet (10 mg total) by mouth 3 (three) times daily as needed. ?Patient taking differently: Take 10 mg by mouth 3 (three) times daily as needed for anxiety. 08/10/21  Yes Gifford Shave, MD  ?insulin aspart (NOVOLOG FLEXPEN) 100 UNIT/ML FlexPen Inject 12 Units into the skin daily before supper. 09/02/21  Yes Hensel, Jamal Collin, MD  ?insulin glargine (LANTUS) 100 UNIT/ML Solostar Pen Inject 36 Units into the skin every morning. 09/02/21  Yes Hensel, Jamal Collin, MD  ?potassium chloride SA (KLOR-CON M) 20 MEQ tablet Take 1 tablet (20 mEq total) by mouth daily. 09/22/21  Yes Gladys Damme, MD  ?sacubitril-valsartan (ENTRESTO) 49-51 MG Take 1 tablet by mouth 2 (two) times daily. 09/22/21 11/21/21 Yes Gladys Damme, MD  ?sertraline (ZOLOFT) 25 MG tablet Take 1 tablet (25 mg total) by mouth daily. 08/31/21  Yes Gifford Shave, MD  ?spironolactone (ALDACTONE) 25 MG tablet Take 25 mg by mouth daily.   Yes [provider]  ?torsemide (DEMADEX) 20 MG tablet Take 2 tablets (40 mg total) by mouth 2 (two) times daily. 09/22/21  Yes Gladys Damme, MD  ?traZODone (DESYREL) 50 MG tablet Take 1 tablet (50 mg total) by mouth at bedtime as needed for sleep. 08/10/21  Yes Gifford Shave, MD  ?warfarin (COUMADIN) 7.5 MG tablet Take 1 and 1/2 tablets (11.25 mg total) by mouth one time only at 4 PM. 09/22/21  Yes Gladys Damme, MD  ?Accu-Chek Softclix Lancets lancets Use as instructed 02/12/19   Caroline More, DO  ?blood glucose meter kit and supplies KIT Dispense based on patient and insurance preference. Use up to four times daily as directed. (FOR ICD-9 250.00, 250.01). 09/27/21   Autry-Lott, Naaman Plummer, DO  ?Blood Glucose  Monitoring Suppl (ACCU-CHEK AVIVA PLUS) w/Device KIT 1 application by Does not apply route QID. Check fasting sugars as well as throughout the day 02/12/19   Caroline More, DO  ?glucose blood (ACCU-CHEK AVIVA PLUS) test strip Check CBG 2 times per day 08/16/19   [name], DO  ?glucose blood (AGAMATRIX PRESTO TEST) test strip Use as instructed 10/23/17   Zenia Resides, MD  ?Insulin Pen Needle (UNIFINE PENTIPS) 31G X 5 MM MISC USE AS DIRECTED 2 TIMES DAILY 09/02/21   Zenia Resides, MD  ?Insulin Syringe-Needle U-100 (INSULIN SYRINGE .3CC/31GX5/16") 31G X 5/16" 0.3 ML MISC 15 Units by Does not apply route 2 (two) times daily. 09/05/20   Rai, Vernelle Emerald, MD  ?Lancets Misc. (ACCU-CHEK SOFTCLIX LANCET DEV) KIT 1 application by Does not apply route 2 (two) times daily. 08/16/19   [name], DO  ?meclizine (ANTIVERT) 12.5  MG tablet Take 1 tablet (12.5 mg total) by mouth 3 (three) times daily as needed for dizziness. ?Patient not taking: Reported on 10/17/2021 09/19/19   [name], DO  ? ? ?Past Medical History: ?Past Medical History:  ?Diagnosis Date  ? Abnormal Pap smear   ? Acute on chronic combined systolic and diastolic CHF (congestive heart failure) ([place]) 02/19/2019  ? Acute on chronic congestive heart failure ([place])   ? Anemia   ? Asthma   ? Atrial fibrillation ([place])   ? history of paroxysmal atrial fibrillation  ? Diabetes mellitus   ? DKA, type 2 ([place]) 02/06/2019  ? Heart failure with reduced ejection fraction ([place])   ? Hypertension   ? Major depressive disorder 02/08/2012  ? Reports that 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    ? Migraines   ? Morbid obesity ([place]) 02/06/2019  ? Nonischemic cardiomyopathy ([place]) 09/22/2021  ? Obstructive sleep apnea 06/30/2015  ? Polycystic ovarian disease   ? ? ?Past Surgical History: ?Past Surgical History:  ?Procedure Laterality Date  ? CERVIX LESION DESTRUCTION    ? CESAREAN SECTION    ? RIGHT/LEFT HEART CATH AND  CORONARY ANGIOGRAPHY N/A 03/29/2019  ? Procedure: RIGHT/LEFT HEART CATH AND CORONARY ANGIOGRAPHY;  Surgeon: Larey Dresser, MD;  Location: [facility];  Service: Cardiovascular;  Laterality: N/A;  ? RIG

## 2021-10-18 NOTE — Evaluation (Addendum)
Physical Therapy Evaluation/ discharge ? ?Patient Details ?Name: [name] ?MRN: [id number] ?DOB: 20-Jun-1976 ?Today's Date: 10/18/2021 ? ?History of Present Illness ? 46 yo female admitted 3/26 with SOB with acute on chronic CHF. PMhx: CAD, HFrEF, HTN, T2DM, OSA, HLD, Anxiety, Obesity, PCOS, Paroxysmal A fib  ?Clinical Impression ? Pt reports being very frustrated with hospital stay, medications and feeling like she isn't getting better. Pt with slow steady gait and limited activity tolerance due to pain and fatigue. Pt lives alone and works and states over the last week she has been sleeping on air mattress and had to use a power cart at grocery store. Pt aware of energy conservation methods and does not require physical assist with mobility with education for walking program. Pt reports she does not eat regularly and drinks juice and has cheez-its then a single meal each day. Pt educated for heart healthy diet. Pt encouraged to continue gait and activity acutely and to initiate walking program at D/C. Pt able to verbalize understanding of all education and agreeable to no further acute therapy needs. Will sign off.  ?   ?   ? ?Recommendations for follow up therapy are one component of a multi-disciplinary discharge planning process, led by the attending physician.  Recommendations may be updated based on patient status, additional functional criteria and insurance authorization. ? ?Follow Up Recommendations No PT follow up ? ?  ?Assistance Recommended at Discharge PRN  ?Patient can return home with the following ? Assistance with cooking/housework ? ?  ?Equipment Recommendations None recommended by PT  ?Recommendations for Other Services ?    ?  ?Functional Status Assessment Patient has not had a recent decline in their functional status  ? ?  ?Precautions / Restrictions Precautions ?Precautions: None  ? ?  ? ?Mobility ? Bed Mobility ?Overal bed mobility: Modified Independent ?  ?  ?  ?  ?  ?  ?General bed  mobility comments: HOB 40 degrees with pt able to rise without assist, reports no tolerance for supine ?  ? ?Transfers ?Overall transfer level: Independent ?  ?  ?  ?  ?  ?  ?  ?  ?  ?  ? ?Ambulation/Gait ?Ambulation/Gait assistance: Modified independent (Device/Increase time) ?Gait Distance (Feet): 120 Feet ?Assistive device: None ?Gait Pattern/deviations: Step-through pattern, Decreased stride length ?  ?Gait velocity interpretation: <1.31 ft/sec, indicative of household ambulator ?  ?General Gait Details: pt with very slow gait reporting SOB without overt symptoms or drop in SpO2. Pt with good balance ? ?Stairs ?  ?  ?  ?  ?  ? ?Wheelchair Mobility ?  ? ?Modified Rankin (Stroke Patients Only) ?  ? ?  ? ?Balance Overall balance assessment: No apparent balance deficits (not formally assessed) ?  ?  ?  ?  ?  ?  ?  ?  ?  ?  ?  ?  ?  ?  ?  ?  ?  ?  ?   ? ? ? ?Pertinent Vitals/Pain Pain Assessment ?Pain Assessment: 0-10 ?Pain Score: 8  ?Pain Location: chest ?Pain Descriptors / Indicators: Other (Comment) (sucking pressure from back to front) ?Pain Intervention(s): Limited activity within patient's tolerance, Patient requesting pain meds-RN notified, Repositioned  ? ? ?Home Living Family/patient expects to be discharged to:: Private residence ?Living Arrangements: Alone ?Available Help at Discharge: Family;Available PRN/intermittently ?Type of Home: House ?Home Access: Stairs to enter ?  ?Entrance Stairs-Number of Steps: 1 ?Alternate Level Stairs-Number of Steps: flight ?Home  Layout: Two level;1/2 bath on main level ?Home Equipment: None ?Additional Comments: pt has air mattress down stairs and has been staying on it with dyspnea  ?  ?Prior Function Prior Level of Function : Independent/Modified Independent;Working/employed;Driving ?  ?  ?  ?  ?  ?  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  ? ? ?Hand Dominance  ?   ? ?  ?Extremity/Trunk Assessment  ? Upper Extremity  Assessment ?Upper Extremity Assessment: Overall WFL for tasks assessed ?  ? ?Lower Extremity Assessment ?Lower Extremity Assessment: Overall WFL for tasks assessed ?  ? ?Cervical / Trunk Assessment ?Cervical / Trunk Assessment: Normal  ?Communication  ? Communication: No difficulties  ?Cognition Arousal/Alertness: Awake/alert ?Behavior During Therapy: [facility] for tasks assessed/performed ?Overall Cognitive Status: Within Functional Limits for tasks assessed ?  ?  ?  ?  ?  ?  ?  ?  ?  ?  ?  ?  ?  ?  ?  ?  ?  ?  ?  ? ?  ?General Comments   ? ?  ?Exercises    ? ?Assessment/Plan  ?  ?PT Assessment Patient does not need any further PT services  ?PT Problem List   ? ?   ?  ?PT Treatment Interventions     ? ?PT Goals (Current goals can be found in the Care Plan section)  ?Acute Rehab PT Goals ?PT Goal Formulation: All assessment and education complete, DC therapy ? ?  ?Frequency   ?  ? ? ?Co-evaluation   ?  ?  ?  ?  ? ? ?  ?AM-PAC PT "6 Clicks" Mobility  ?Outcome Measure Help needed turning from your back to your side while in a flat bed without using bedrails?: None ?Help needed moving from lying on your back to sitting on the side of a flat bed without using bedrails?: None ?Help needed moving to and from a bed to a chair (including a wheelchair)?: None ?Help needed standing up from a chair using your arms (e.g., wheelchair or bedside chair)?: None ?Help needed to walk in hospital room?: A Little ?Help needed climbing 3-5 steps with a railing? : A Little ?6 Click Score: 22 ? ?  ?End of Session   ?Activity Tolerance: Patient limited by fatigue ?Patient left: in bed;with call bell/phone within reach ?Nurse Communication: Mobility status ?PT Visit Diagnosis: Other abnormalities of gait and mobility (R26.89) ?  ? ?Time: 2671-2458 ?PT Time Calculation (min) (ACUTE ONLY): 31 min ? ? ?Charges:   PT Evaluation ?$PT Eval Moderate Complexity: 1 Mod ?PT Treatments ?$Therapeutic Activity: 8-22 mins ?  ?   ? ? ?Millisa Giarrusso P, PT ?Acute  Rehabilitation Services ?Pager: [phone] ?Office: [phone] ? ? ?Tuvia Woodrick B Docia Klar ?10/18/2021, 10:29 AM ? ?

## 2021-10-18 NOTE — Progress Notes (Signed)
?   10/18/21 0412  ?Assess: MEWS Score  ?Temp 98.4 ?F (36.9 ?C)  ?BP (!) 150/117  ?Pulse Rate (!) 112  ?ECG Heart Rate (!) 111  ?Resp 19  ?Level of Consciousness Alert  ?SpO2 97 %  ?O2 Device Room Air  ?Assess: MEWS Score  ?MEWS Temp 0  ?MEWS Systolic 0  ?MEWS Pulse 2  ?MEWS RR 0  ?MEWS LOC 0  ?MEWS Score 2  ?MEWS Score Color Yellow  ?Assess: if the MEWS score is Yellow or Red  ?Were vital signs taken at a resting state? Yes  ?Focused Assessment No change from prior assessment  ?Early Detection of Sepsis Score *See Row Information* Medium  ?MEWS guidelines implemented *See Row Information* Yes  ?Treat  ?MEWS Interventions Escalated (See documentation below)  ?Pain Scale 0-10  ?Pain Score 0  ?Take Vital Signs  ?Increase Vital Sign Frequency  Yellow: Q 2hr X 2 then Q 4hr X 2, if remains yellow, continue Q 4hrs  ?Escalate  ?MEWS: Escalate Yellow: discuss with charge nurse/RN and consider discussing with provider and RRT  ?Notify: Charge Nurse/RN  ?Name of Charge Nurse/RN Notified [place] RN  ?Date Charge Nurse/RN Notified 10/18/21  ?Time Charge Nurse/RN Notified 0400  ?Notify: Provider  ?Provider Name/Title Orvis Brill DO  ?Date Provider Notified 10/18/21  ?Time Provider Notified [phone]  ?Notification Type Call  ?Notification Reason  ?(High Blood pressure)  ?Provider response No new orders ?(Asked to continue to monitor and page if pt SBP increase above 210)  ?Date of Provider Response 10/18/21  ?Time of Provider Response [phone]  ?Document  ?Patient Outcome Stabilized after interventions  ?Progress note created (see row info) Yes  ? ? ?

## 2021-10-18 NOTE — TOC Benefit Eligibility Note (Signed)
Patient Advocate Encounter ? ?Insurance verification completed.   ? ?The patient is currently admitted and upon discharge could be taking Xarelto 20 mg. ? ?The current 30 day co-pay is, $4.00.  ? ?The patient is insured through [place] [employer]  ? ? ? ?Lyndel Safe, CPhT ?Pharmacy Patient Advocate Specialist ?[facility] Patient Advocate Team ?Direct Number: [phone]  Fax: [phone] ? ? ? ? ? ?  ?

## 2021-10-18 NOTE — ED Notes (Signed)
RN called 6E to request bed approval and purple man for pt. Told that [employer] RN is with another pt at this time and will review pt's chart when available.  ?

## 2021-10-18 NOTE — Progress Notes (Signed)
ANTICOAGULATION CONSULT NOTE - Initial Consult ? ?Pharmacy Consult for Warfarin  ?Indication: atrial fibrillation ? ?Allergies  ?Allergen Reactions  ? Shellfish Allergy Swelling  ?  Airway involvement including EMS - Has Epi-Pen  ? Dulaglutide Nausea And Vomiting and Other (See Comments)  ?  Multiple episodes of vomiting over multiple days  ? Metformin And Related Diarrhea  ? Ibuprofen Other (See Comments)  ?  Per PCP interferes with daily meds (heart failure)  ? ?Vital Signs: ?Temp: 98.1 ?F (36.7 ?C) (03/27 0133) ?Temp Source: Oral (03/27 0133) ?BP: 151/117 (03/27 0133) ?Pulse Rate: 104 (03/27 0133) ? ?Labs: ?Recent Labs  ?  10/17/21 ?1917  ?HGB 10.6*  ?HCT 34.4*  ?PLT 345  ?CREATININE 0.84  ?TROPONINIHS 13  ? ? ?CrCl cannot be calculated (Unknown ideal weight.). ? ? ?Medical History: ?Past Medical History:  ?Diagnosis Date  ? Abnormal Pap smear   ? Acute on chronic combined systolic and diastolic CHF (congestive heart failure) ([place]) 02/19/2019  ? Acute on chronic congestive heart failure ([place])   ? Anemia   ? Asthma   ? Atrial fibrillation ([place])   ? history of paroxysmal atrial fibrillation  ? Diabetes mellitus   ? DKA, type 2 ([place]) 02/06/2019  ? Heart failure with reduced ejection fraction ([place])   ? Hypertension   ? Major depressive disorder 02/08/2012  ? Reports that 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    ? Migraines   ? Morbid obesity ([place]) 02/06/2019  ? Nonischemic cardiomyopathy ([place]) 09/22/2021  ? Obstructive sleep apnea 06/30/2015  ? Polycystic ovarian disease   ? ? ? ?Assessment: ?46 y/o F presents to the ED with respiratory distress, ?HF exacerbation. On warfarin PTA for afib. No INR drawn yet. ? ?Goal of Therapy:  ?INR 2-3 ?Monitor platelets by anticoagulation protocol: Yes ?  ?Plan:  ?INR with AM labs to assess dosing needs ? ?Narda Bonds, PharmD, BCPS ?Clinical Pharmacist ?Phone: [phone] ? ? ? ?

## 2021-10-18 NOTE — Progress Notes (Signed)
?  Transition of Care (TOC) Screening Note ? ? ?Patient Details  ?Name: [name] ?Date of Birth: 1975-08-02 ? ? ?Transition of Care (TOC) CM/SW Contact:    ?Zahniya Zellars, LCSW ?Phone Number: ?10/18/2021, 3:16 PM ? ? ? ?Transition of Care Department [facility]) has reviewed patient and no TOC needs have been identified at this time. We will continue to monitor patient advancement through interdisciplinary progression rounds. Patient will benefit from PT/OT consult for disposition recommendations. If new patient transition needs arise, please place a TOC consult. ?  ?

## 2021-10-18 NOTE — Progress Notes (Signed)
ANTICOAGULATION CONSULT NOTE - Initial Consult ? ?Pharmacy Consult for Xarelto ?Indication:  LV Thrombus ? ?Allergies  ?Allergen Reactions  ? Shellfish Allergy Swelling  ?  Airway involvement including EMS - Has Epi-Pen  ? Dulaglutide Nausea And Vomiting and Other (See Comments)  ?  Multiple episodes of vomiting over multiple days  ? Metformin And Related Diarrhea  ? Ibuprofen Other (See Comments)  ?  Per PCP interferes with daily meds (heart failure)  ? ? ?Patient Measurements: ?Height: '4\' 11"'$  (149.9 cm) ?Weight: 85 kg (187 lb 6.4 oz) ?IBW/kg (Calculated) : 43.2 ? ?Vital Signs: ?Temp: 97.9 ?F (36.6 ?C) (03/27 0758) ?Temp Source: Oral (03/27 0758) ?BP: 137/101 (03/27 1156) ?Pulse Rate: 107 (03/27 1156) ? ?Labs: ?Recent Labs  ?  10/17/21 ?1917 10/18/21 ?0540  ?HGB 10.6* 11.3*  ?HCT 34.4* 35.1*  ?PLT 345 358  ?LABPROT  --  12.8  ?INR  --  1.0  ?CREATININE 0.84 0.73  ?TROPONINIHS 13 14  ? ? ?Estimated Creatinine Clearance: 85.7 mL/min (by C-G formula based on SCr of 0.73 mg/dL). ? ? ?Medical History: ?Past Medical History:  ?Diagnosis Date  ? Abnormal Pap smear   ? Acute on chronic combined systolic and diastolic CHF (congestive heart failure) ([place]) 02/19/2019  ? Acute on chronic congestive heart failure ([place])   ? Anemia   ? Asthma   ? Atrial fibrillation ([place])   ? history of paroxysmal atrial fibrillation  ? Diabetes mellitus   ? DKA, type 2 ([place]) 02/06/2019  ? Heart failure with reduced ejection fraction ([place])   ? Hypertension   ? Major depressive disorder 02/08/2012  ? Reports that 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    ? Migraines   ? Morbid obesity ([place]) 02/06/2019  ? Nonischemic cardiomyopathy ([place]) 09/22/2021  ? Obstructive sleep apnea 06/30/2015  ? Polycystic ovarian disease   ? ? ?Medications:  ?Scheduled:  ? atorvastatin  40 mg Oral Daily  ? busPIRone  7.5 mg Oral BID  ? carvedilol  6.25 mg Oral BID WC  ? digoxin  0.125 mg Oral Daily  ?  empagliflozin  25 mg Oral Daily  ? furosemide  60 mg Intravenous BID  ? gabapentin  100 mg Oral BID  ? insulin aspart  0-15 Units Subcutaneous TID WC  ? insulin glargine-yfgn  18 Units Subcutaneous Daily  ? sacubitril-valsartan  1 tablet Oral BID  ? sertraline  50 mg Oral Daily  ? spironolactone  25 mg Oral Daily  ? ? ?Assessment: ?29 yof who has hx LV thrombus and atrial fibrillation - on warfarin PTA (LD 3/25'@0900'$ ). INR on admission came back at 1. ? ?Discussed with medical team - given concern with subtherapeutic INR will change to Xarelto. Hgb 11.3, plt 358. No s/sx of bleeding.  ?  ?Goal of Therapy:  ?Monitor platelets by anticoagulation protocol: Yes ?  ?Plan:  ?Start Xarelto 20 mg tonight  ?Monitor renal fx, CBC, and for s/sx of bleeding  ? ?Antonietta Jewel, PharmD, BCCCP ?Clinical Pharmacist  ?Phone: [phone] ?10/18/2021 3:14 PM ? ?Please check AMION for all [place] phone numbers ?After 10:00 PM, call [place] [phone] ? ? ? ?

## 2021-10-18 NOTE — ED Notes (Signed)
Dr. Owens Shark made aware of repeated unsuccessful attempts to obtain ordered blood work ?

## 2021-10-18 NOTE — Progress Notes (Signed)
OT Cancellation Note ? ?Patient Details ?Name: [name] ?MRN: [id number] ?DOB: 08-Mar-1976 ? ? ?Cancelled Treatment:    Reason Eval/Treat Not Completed: Patient declined, no reason specified (Per PT, pt declining OT visit today.) ? ?Malka So ?10/18/2021, 10:59 AM ?Nestor Lewandowsky, OTR/L ?Acute Rehabilitation Services ?Pager: [phone] ?Office: [phone]  ?

## 2021-10-18 NOTE — Progress Notes (Signed)
Family Medicine Teaching Service ?Daily Progress Note ?Intern Pager: [phone] ? ?Patient name: [name] Medical record number: [id number] ?Date of birth: Mar 09, 1976 Age: 46 y.o. Gender: female ? ?Primary Care Provider: Gifford Shave, MD ?Consultants: None ?Code Status: Full ? ?Pt Overview and Major Events to Date:  ?3/26: Admitted ? ?Assessment and Plan: ?[name] is a 46 y.o. female presenting with shortness of breath and increased work of breathing. PMH is significant for HFrEF, HTN, T2DM, OSA, HLD, Anxiety, Obesity, PCOS, Paroxysmal A fib. ? ?Dyspnea with tachycardia  acute on chronic systolic CHF ?Patient does not appear to struggle to breathe and does not have oxygen requirement.  She remains tachycardic in the 110s which may be correlated to her medication nonadherence however PE remains on differential so we will obtain D-dimer.  She does not fit an infectious picture at this time.  BNP elevated to 428.  Would appreciate heart failure team recommendations given PE not ruled out and patient is poorly compliant with Coumadin given INR of 1.  Patient is also poorly compliant with digoxin given level less than 0.2.  Troponins negative and pulse ox satting well in the upper 90s and less concern for ACS.  Patient states that her chest pressure was relieved with the IV pain medication that she received overnight.  This was IV Ativan and patient appears comfortable therefore I am hesitant to give regular pain medication at this time. ?-Consulted heart failure team, appreciate recommendations ?-Lasix 60 mg twice daily ?-Spironolactone 25 mg daily, Coreg 6.25 mg twice daily ?-Restart digoxin 0.125 mg daily ?-Jardiance 25 mg daily ?-D-dimer ?-A.m. CMP ?-Tylenol 1000 mg every 6 hours as needed ? ?Right CVA tenderness with suprapubic discomfort ?We will assess for UTI and less likely kidney stones.  As stated previously, patient remains afebrile and likely not pyelonephritis. ?-UA, UDS ? ?HTN ?Poorly  controlled, elevated at 139/99.  Patient endorses compliance with blood pressure medications. ?-continue home coreg 6.'25mg'$  BID,  ?-Increase entresto to 97-103 twice daily ?-restart home spironolactone  ? ?T2DM ?-CBGs elevated 200s.  Home regimen: Lantus 36 units in the morning, NovoLog 12 units before dinner, Jardiance 25 mg daily. ?-Continue Lantus 16 units daily, will adjust daily as necessary ?-Moderate SSI ?-Continue Jardiance 25 mg daily ?-CBG 4 times daily, at meals and at bedtime ? ?Paroxysmal A-fib ?Not in A-fib at this time however sinus tachycardic at 110s.  INR 1 showing poor adherence to medication. ?-Heart failure consulted, appreciate recommendations ?-Transition from Coumadin to Xarelto 20 mg daily ? ?Anxiety and depression ?Suspect medication nonadherence is at least partially secondary to poorly controlled anxiety/depression.  Would benefit from family meeting. ?-Zoloft increased to 50 mg daily ?-Continue BuSpar 7.5 mg twice daily, hydroxyzine 10 mg 3 times daily as needed ? ?FEN/GI: Heart healthy ?PPx: Coumadin per pharmacy ?Dispo:Home pending clinical improvement . Barriers include diuresis and heart failure recommendations.  ? ?Subjective:  ?She describes her problem as chest pressure with pain that radiates to the midline of her back where a bra strap would be.  States her pain is 8 out of 10 but states it is not pain and it is chest pressure.  She hates that she continually needs to come to the hospital when she is only 46 years old. ? ?Objective: ?Temp:  [97.9 ?F (36.6 ?C)-99.4 ?F (37.4 ?C)] 97.9 ?F (36.6 ?C) (03/27 0758) ?Pulse Rate:  [104-117] 106 (03/27 0758) ?Resp:  [14-27] 18 (03/27 0758) ?BP: (139-172)/(99-127) 139/99 (03/27 0758) ?SpO2:  [97 %-100 %]  98 % (03/27 0758) ?Weight:  [84.8 kg-85 kg] 85 kg (03/27 0412) ?Physical Exam: ?General: Awake, alert, NAD ?Cardiovascular: Regular rhythm, tachycardic, no murmurs auscultated ?Respiratory: CTA B, no increased work of breathing, no oxygen  requirement ?Abdomen: Soft, suprapubic discomfort, obese abdomen ?Back: Right CVA tenderness ?Extremities: No pitting edema, moves all extremities equally and without difficulty ? ?Laboratory: ?Recent Labs  ?Lab 10/17/21 ?1917 10/18/21 ?0540  ?WBC 12.6* 12.6*  ?HGB 10.6* 11.3*  ?HCT 34.4* 35.1*  ?PLT 345 358  ? ?Recent Labs  ?Lab 10/17/21 ?1917 10/18/21 ?0540  ?NA 134* 135  ?K 3.7 3.7  ?CL 105 105  ?CO2 21* 20*  ?BUN 10 10  ?CREATININE 0.84 0.73  ?CALCIUM 8.7* 9.0  ?GLUCOSE 252* 229*  ? ? ?Digoxin: <0.2 ?BNP: 428 ?Mag: 1.7 ? ?Imaging/Diagnostic Tests: ?DG Chest 2 View ? ?Result Date: 10/17/2021 ?CLINICAL DATA:  Chest pain and shortness of breath. EXAM: CHEST - 2 VIEW COMPARISON:  September 17, 2021 FINDINGS: The cardiac silhouette is mildly enlarged and unchanged in size. Mild prominence of the perihilar pulmonary vasculature is seen. Mild atelectasis is noted within the bilateral lung bases. There is no evidence of a pleural effusion or pneumothorax. The visualized skeletal structures are unremarkable. IMPRESSION: 1. Stable cardiomegaly with mild pulmonary vascular congestion. 2. Mild bibasilar atelectasis. Electronically Signed   By: Virgina Norfolk M.D.   On: 10/17/2021 20:01   ? ?Wells Guiles, DO ?10/18/2021, 9:42 AM ?PGY-1, [facility] ?[facility] Intern pager: [phone], text pages welcome ? ?

## 2021-10-19 ENCOUNTER — Other Ambulatory Visit: Payer: Self-pay

## 2021-10-19 ENCOUNTER — Other Ambulatory Visit (HOSPITAL_COMMUNITY): Payer: Self-pay

## 2021-10-19 DIAGNOSIS — I5033 Acute on chronic diastolic (congestive) heart failure: Secondary | ICD-10-CM

## 2021-10-19 DIAGNOSIS — I272 Pulmonary hypertension, unspecified: Secondary | ICD-10-CM | POA: Diagnosis present

## 2021-10-19 DIAGNOSIS — Z79899 Other long term (current) drug therapy: Secondary | ICD-10-CM | POA: Diagnosis not present

## 2021-10-19 DIAGNOSIS — J9811 Atelectasis: Secondary | ICD-10-CM | POA: Diagnosis present

## 2021-10-19 DIAGNOSIS — F339 Major depressive disorder, recurrent, unspecified: Secondary | ICD-10-CM | POA: Diagnosis not present

## 2021-10-19 DIAGNOSIS — E1165 Type 2 diabetes mellitus with hyperglycemia: Secondary | ICD-10-CM | POA: Diagnosis not present

## 2021-10-19 DIAGNOSIS — D649 Anemia, unspecified: Secondary | ICD-10-CM | POA: Diagnosis present

## 2021-10-19 DIAGNOSIS — Z66 Do not resuscitate: Secondary | ICD-10-CM | POA: Diagnosis not present

## 2021-10-19 DIAGNOSIS — E785 Hyperlipidemia, unspecified: Secondary | ICD-10-CM | POA: Diagnosis present

## 2021-10-19 DIAGNOSIS — F32A Depression, unspecified: Secondary | ICD-10-CM | POA: Diagnosis present

## 2021-10-19 DIAGNOSIS — I152 Hypertension secondary to endocrine disorders: Secondary | ICD-10-CM | POA: Diagnosis present

## 2021-10-19 DIAGNOSIS — I509 Heart failure, unspecified: Secondary | ICD-10-CM | POA: Diagnosis not present

## 2021-10-19 DIAGNOSIS — F419 Anxiety disorder, unspecified: Secondary | ICD-10-CM | POA: Diagnosis present

## 2021-10-19 DIAGNOSIS — I480 Paroxysmal atrial fibrillation: Secondary | ICD-10-CM | POA: Diagnosis present

## 2021-10-19 DIAGNOSIS — I2510 Atherosclerotic heart disease of native coronary artery without angina pectoris: Secondary | ICD-10-CM | POA: Diagnosis present

## 2021-10-19 DIAGNOSIS — I428 Other cardiomyopathies: Secondary | ICD-10-CM | POA: Diagnosis present

## 2021-10-19 DIAGNOSIS — I5082 Biventricular heart failure: Secondary | ICD-10-CM | POA: Diagnosis present

## 2021-10-19 DIAGNOSIS — E876 Hypokalemia: Secondary | ICD-10-CM | POA: Diagnosis present

## 2021-10-19 DIAGNOSIS — D72829 Elevated white blood cell count, unspecified: Secondary | ICD-10-CM | POA: Diagnosis present

## 2021-10-19 DIAGNOSIS — G4733 Obstructive sleep apnea (adult) (pediatric): Secondary | ICD-10-CM | POA: Diagnosis present

## 2021-10-19 DIAGNOSIS — I110 Hypertensive heart disease with heart failure: Secondary | ICD-10-CM | POA: Diagnosis present

## 2021-10-19 DIAGNOSIS — G2581 Restless legs syndrome: Secondary | ICD-10-CM | POA: Diagnosis present

## 2021-10-19 DIAGNOSIS — Z9114 Patient's other noncompliance with medication regimen: Secondary | ICD-10-CM | POA: Diagnosis not present

## 2021-10-19 DIAGNOSIS — Z20822 Contact with and (suspected) exposure to covid-19: Secondary | ICD-10-CM | POA: Diagnosis present

## 2021-10-19 HISTORY — DX: Acute on chronic diastolic (congestive) heart failure: I50.33

## 2021-10-19 LAB — CBC
HCT: 40.9 % (ref 36.0–46.0)
Hemoglobin: 12.9 g/dL (ref 12.0–15.0)
MCH: 23.7 pg — ABNORMAL LOW (ref 26.0–34.0)
MCHC: 31.5 g/dL (ref 30.0–36.0)
MCV: 75 fL — ABNORMAL LOW (ref 80.0–100.0)
Platelets: 416 10*3/uL — ABNORMAL HIGH (ref 150–400)
RBC: 5.45 MIL/uL — ABNORMAL HIGH (ref 3.87–5.11)
RDW: 17.5 % — ABNORMAL HIGH (ref 11.5–15.5)
WBC: 11.8 10*3/uL — ABNORMAL HIGH (ref 4.0–10.5)
nRBC: 0 % (ref 0.0–0.2)

## 2021-10-19 LAB — MAGNESIUM: Magnesium: 2.6 mg/dL — ABNORMAL HIGH (ref 1.7–2.4)

## 2021-10-19 LAB — COMPREHENSIVE METABOLIC PANEL
ALT: 17 U/L (ref 0–44)
AST: 19 U/L (ref 15–41)
Albumin: 3.2 g/dL — ABNORMAL LOW (ref 3.5–5.0)
Alkaline Phosphatase: 117 U/L (ref 38–126)
Anion gap: 11 (ref 5–15)
BUN: 13 mg/dL (ref 6–20)
CO2: 23 mmol/L (ref 22–32)
Calcium: 9.4 mg/dL (ref 8.9–10.3)
Chloride: 101 mmol/L (ref 98–111)
Creatinine, Ser: 1.19 mg/dL — ABNORMAL HIGH (ref 0.44–1.00)
GFR, Estimated: 58 mL/min — ABNORMAL LOW (ref >60)
Glucose, Bld: 171 mg/dL — ABNORMAL HIGH (ref 70–99)
Potassium: 4.2 mmol/L (ref 3.5–5.1)
Sodium: 135 mmol/L (ref 135–145)
Total Bilirubin: 1.4 mg/dL — ABNORMAL HIGH (ref 0.3–1.2)
Total Protein: 8.3 g/dL — ABNORMAL HIGH (ref 6.5–8.1)

## 2021-10-19 LAB — IRON AND TIBC
Iron: 30 ug/dL (ref 28–170)
Saturation Ratios: 7 % — ABNORMAL LOW (ref 10.4–31.8)
TIBC: 438 ug/dL (ref 250–450)
UIBC: 408 ug/dL

## 2021-10-19 LAB — GLUCOSE, CAPILLARY
Glucose-Capillary: 201 mg/dL — ABNORMAL HIGH (ref 70–99)
Glucose-Capillary: 277 mg/dL — ABNORMAL HIGH (ref 70–99)
Glucose-Capillary: 120 mg/dL — ABNORMAL HIGH (ref 70–99)

## 2021-10-19 LAB — FERRITIN: Ferritin: 31 ng/mL (ref 11–307)

## 2021-10-19 MED ORDER — SPIRONOLACTONE 25 MG PO TABS
25.0000 mg | ORAL_TABLET | Freq: Every day | ORAL | 0 refills | Status: DC
  Filled 2021-10-19 (×2): qty 30, 30d supply, fill #0

## 2021-10-19 MED ORDER — TORSEMIDE 20 MG PO TABS
40.0000 mg | ORAL_TABLET | Freq: Two times a day (BID) | ORAL | Status: DC
  Filled 2021-10-19: qty 2

## 2021-10-19 MED ORDER — RIVAROXABAN 20 MG PO TABS
20.0000 mg | ORAL_TABLET | Freq: Every day | ORAL | 0 refills | Status: DC
  Filled 2021-10-19 (×2): qty 30, 30d supply, fill #0

## 2021-10-19 MED ORDER — SODIUM CHLORIDE 0.9 % IV SOLN
510.0000 mg | Freq: Once | INTRAVENOUS | Status: AC
  Administered 2021-10-19: 510 mg via INTRAVENOUS
  Filled 2021-10-19: qty 17

## 2021-10-19 MED ORDER — FUROSEMIDE 10 MG/ML IJ SOLN
60.0000 mg | Freq: Once | INTRAMUSCULAR | Status: AC
  Administered 2021-10-19: 60 mg via INTRAVENOUS
  Filled 2021-10-19: qty 6

## 2021-10-19 MED ORDER — SERTRALINE HCL 50 MG PO TABS
50.0000 mg | ORAL_TABLET | Freq: Every day | ORAL | 0 refills | Status: DC
  Filled 2021-10-19 (×2): qty 30, 30d supply, fill #0

## 2021-10-19 MED ORDER — SACUBITRIL-VALSARTAN 97-103 MG PO TABS
1.0000 | ORAL_TABLET | Freq: Two times a day (BID) | ORAL | 0 refills | Status: DC
  Filled 2021-10-19 (×2): qty 60, 30d supply, fill #0

## 2021-10-19 NOTE — Discharge Instructions (Addendum)
Dear [name],  ? ?Thank you for letting us participate in your care! In this section, you will find a brief hospital admission summary of why you were admitted to the hospital, what happened during your admission, your diagnosis/diagnoses, and recommended follow up.  ?You were admitted because you were experiencing chest pressure.  This is believed to be related to heart failure exacerbation secondary to medication use.  Her medication regimen was changed and adjusted to reduce the number of medications he would need to take.  You were changed from Coumadin to Xarelto so that your blood work would not need to be checked as frequently.  We also increased your Zoloft due to concerns for depression.  It is my general recommendation that you speak with someone about what you are going through.  We have made follow-up appointments with you to ensure close follow-up, please see below. ? ?DOCTOR'S APPOINTMENTS & FOLLOW UP ?Future Appointments  ?Date Time Provider [place]  ?10/22/2021  1:30 PM ACCESS TO CARE POOL [id number]  ?10/22/2021  3:30 PM [facility] PA/NP [facility] None  ?11/22/2021 11:40 AM Larey Dresser, MD [facility] None  ? ? ? ?Thank you for choosing [facility]! Take care and be well! ? ?Family Medicine Teaching Service Inpatient Team ?[place]  ?[facility]  ?[address] [place], [place] [address] ?[phone] ? ? ?Information on my medicine - XARELTO? (Rivaroxaban) ? ?Why was Xarelto? prescribed for you? ?Xarelto? was prescribed for you due to a blood clot in your heart. Taking Xarelto will hopefully prevent this blood clot from causing a stroke.  ? ?What do you need to know about xarelto? ? ?Take your Xarelto? ONCE DAILY at the same time every day with your evening meal. ?If you have difficulty swallowing the tablet whole, you may crush it and mix in applesauce just prior to taking your dose. ? ?Take Xarelto? exactly as prescribed by your doctor and DO NOT stop  taking Xarelto? without talking to the doctor who prescribed the medication.  Stopping without other stroke prevention medication to take the place of Xarelto? may increase your risk of developing a clot that causes a stroke.  Refill your prescription before you run out. ? ?After discharge, you should have regular check-up appointments with your healthcare provider that is prescribing your Xarelto?Marland Kitchen  In the future your dose may need to be changed if your kidney function or weight changes by a significant amount. ? ?What do you do if you miss a dose? ?If you are taking Xarelto? ONCE DAILY and you miss a dose, take it as soon as you remember on the same day then continue your regularly scheduled once daily regimen the next day. Do not take two doses of Xarelto? at the same time or on the same day.  ? ?Important Safety Information ?A possible side effect of Xarelto? is bleeding. You should call your healthcare provider right away if you experience any of the following: ?Bleeding from an injury or your nose that does not stop. ?Unusual colored urine (red or dark brown) or unusual colored stools (red or black). ?Unusual bruising for unknown reasons. ?A serious fall or if you hit your head (even if there is no bleeding). ? ?Some medicines may interact with Xarelto? and might increase your risk of bleeding while on Xarelto?Marland Kitchen To help avoid this, consult your healthcare provider or pharmacist prior to using any new prescription or non-prescription medications, including herbals, vitamins, non-steroidal anti-inflammatory drugs (NSAIDs)  and supplements. ? ?This website has more information on Xarelto?: [email or web address].[email or web address]  ?

## 2021-10-19 NOTE — Evaluation (Signed)
Occupational Therapy Evaluation ?Patient Details ?Name: [name] ?MRN: [id number] ?DOB: 03-31-1976 ?Today's Date: 10/19/2021 ? ? ?History of Present Illness 46 yo female admitted 3/26 with SOB with acute on chronic CHF. PMhx: CAD, HFrEF, HTN, T2DM, OSA, HLD, Anxiety, Obesity, PCOS, Paroxysmal A fib  ? ?Clinical Impression ?  ?Pt reports independence at baseline with mobility, states boyfriend assists with LB dressing at times due to fatigue. Pt lives in 2 story home, can live on main floor if needed. Pt nauseaus throughout session, currently requiring set up -mod A for ADLs, mod I for bed mobility and transfers. Pt expressing feelings of discouragement regarding hospital stay, however when asked declines speaking with chaplain or other supportive services. Pt presenting with impairments listed below, will follow acutely to maximize safety and independence with ADLs and functional mobility. ?   ? ?Recommendations for follow up therapy are one component of a multi-disciplinary discharge planning process, led by the attending physician.  Recommendations may be updated based on patient status, additional functional criteria and insurance authorization.  ? ?Follow Up Recommendations ? No OT follow up  ?  ?Assistance Recommended at Discharge Set up Supervision/Assistance  ?Patient can return home with the following A little help with walking and/or transfers;A little help with bathing/dressing/bathroom;Help with stairs or ramp for entrance;Assist for transportation ? ?  ?Functional Status Assessment ? Patient has had a recent decline in their functional status and demonstrates the ability to make significant improvements in function in a reasonable and predictable amount of time.  ?Equipment Recommendations ? None recommended by OT  ?  ?Recommendations for Other Services   ? ? ?  ?Precautions / Restrictions Precautions ?Precautions: None ?Restrictions ?Weight Bearing Restrictions: No  ? ?  ? ?Mobility Bed  Mobility ?Overal bed mobility: Modified Independent ?  ?  ?  ?  ?  ?  ?General bed mobility comments: increased time ?  ? ?Transfers ?Overall transfer level: Modified independent ?  ?  ?  ?  ?  ?  ?  ?  ?  ?  ? ?  ?Balance Overall balance assessment: Mild deficits observed, not formally tested ?  ?  ?  ?  ?  ?  ?  ?  ?  ?  ?  ?  ?  ?  ?  ?  ?  ?  ?   ? ?ADL either performed or assessed with clinical judgement  ? ?ADL Overall ADL's : Needs assistance/impaired ?Eating/Feeding: Set up;Sitting ?  ?Grooming: Set up;Sitting ?  ?Upper Body Bathing: Set up;Sitting ?  ?Lower Body Bathing: Minimal assistance;Sitting/lateral leans;Sit to/from stand ?  ?Upper Body Dressing : Set up;Sitting ?  ?Lower Body Dressing: Moderate assistance;Sitting/lateral leans ?  ?Toilet Transfer: Stand-pivot;BSC/3in1;Rolling walker (2 wheels);Supervision/safety ?  ?Toileting- Clothing Manipulation and Hygiene: Supervision/safety;Sitting/lateral lean ?Toileting - Clothing Manipulation Details (indicate cue type and reason): for pericare ?  ?  ?Functional mobility during ADLs: Min guard ?   ? ? ? ?Vision   ?Vision Assessment?: No apparent visual deficits  ?   ?Perception   ?  ?Praxis   ?  ? ?Pertinent Vitals/Pain Pain Assessment ?Pain Assessment: No/denies pain ?Pain Score: 8  ?Pain Location: chest ?Pain Descriptors / Indicators: Discomfort ?Pain Intervention(s): Limited activity within patient's tolerance, Monitored during session, Repositioned, Patient requesting pain meds-RN notified  ? ? ? ?Hand Dominance   ?  ?Extremity/Trunk Assessment Upper Extremity Assessment ?Upper Extremity Assessment: Overall WFL for tasks assessed ?  ?Lower Extremity Assessment ?Lower Extremity Assessment:  Overall [facility] for tasks assessed ?  ?Cervical / Trunk Assessment ?Cervical / Trunk Assessment: Normal ?  ?Communication Communication ?Communication: Other (comment) (speaks softly) ?  ?Cognition Arousal/Alertness: Awake/alert ?Behavior During Therapy: [facility] for tasks  assessed/performed ?Overall Cognitive Status: Within Functional Limits for tasks assessed ?  ?  ?  ?  ?  ?  ?  ?  ?  ?  ?  ?  ?  ?  ?  ?  ?  ?  ?  ?General Comments  VSS on RA, pt with nausea during session ? ?  ?Exercises   ?  ?Shoulder Instructions    ? ? ?Home Living Family/patient expects to be discharged to:: Private residence ?Living Arrangements: Alone ?Available Help at Discharge: Family;Available PRN/intermittently (boyfriend) ?Type of Home: House ?Home Access: Stairs to enter ?Entrance Stairs-Number of Steps: 1 ?  ?Home Layout: Two level;1/2 bath on main level ?Alternate Level Stairs-Number of Steps: flight ?  ?Bathroom Shower/Tub: Tub/shower unit ?  ?Bathroom Toilet: Standard ?  ?  ?Home Equipment: None ?  ?Additional Comments: pt has air mattress down stairs and has been staying on it with dyspnea ?  ? ?  ?Prior Functioning/Environment Prior Level of Function : Independent/Modified Independent;Working/employed;Driving ?  ?  ?  ?  ?  ?  ?Mobility Comments: no AD ?ADLs Comments: reports boyfriend assists with LB dressing ?  ? ?  ?  ?OT Problem List: Decreased strength;Decreased range of motion;Decreased activity tolerance;Impaired balance (sitting and/or standing);Cardiopulmonary status limiting activity ?  ?   ?OT Treatment/Interventions: Self-care/ADL training;Therapeutic exercise;Energy conservation;DME and/or AE instruction;Therapeutic activities;Balance training  ?  ?OT Goals(Current goals can be found in the care plan section) Acute Rehab OT Goals ?Patient Stated Goal: to feel better ?OT Goal Formulation: With patient ?Time For Goal Achievement: 11/02/21 ?Potential to Achieve Goals: Good ?ADL Goals ?Pt Will Perform Upper Body Dressing: with modified independence;sitting ?Pt Will Perform Lower Body Dressing: with min guard assist;sit to/from stand;sitting/lateral leans ?Pt Will Transfer to Toilet: with modified independence;ambulating;regular height toilet ?Pt Will Perform Tub/Shower Transfer: with  supervision;Tub transfer;Shower transfer  ?OT Frequency: Min 2X/week ?  ? ?Co-evaluation   ?  ?  ?  ?  ? ?  ?AM-PAC OT "6 Clicks" Daily Activity     ?Outcome Measure Help from another person eating meals?: None ?Help from another person taking care of personal grooming?: None ?Help from another person toileting, which includes using toliet, bedpan, or urinal?: None ?Help from another person bathing (including washing, rinsing, drying)?: A Little ?Help from another person to put on and taking off regular upper body clothing?: None ?Help from another person to put on and taking off regular lower body clothing?: A Little ?6 Click Score: 22 ?  ?End of Session Nurse Communication: Mobility status ? ?Activity Tolerance: Patient tolerated treatment well ?Patient left: in bed;Other (comment) (Sitting EOB eating lunch) ? ?OT Visit Diagnosis: Unsteadiness on feet (R26.81);Other abnormalities of gait and mobility (R26.89);Muscle weakness (generalized) (M62.81)  ?              ?Time: 2952-8413 ?OT Time Calculation (min): 22 min ?Charges:  OT General Charges ?$OT Visit: 1 Visit ?OT Evaluation ?$OT Eval Low Complexity: 1 Low ? ?Lynnda Child, OTD, OTR/L ?Acute Rehab ?[phone] ? ?Kaylyn Lim ?10/19/2021, 3:17 PM ?

## 2021-10-19 NOTE — Hospital Course (Addendum)
[name] is a 46 y.o.female with a history of HFrEF, HTN, T2DM, OSA, HLD, anxiety, obesity, PCOS, paroxysmal Afib who was admitted to the [facility] Teaching Service at [facility] for respiratory distress and chest pressure 2/2 acute on chronic CHF exacerbation 2/2 medication nonadherence. Her hospital course is detailed below: ? ?Acute on chronic biventricular heart failure ?4 days of worsening shortness of breath and orthopnea with improper dosing of torsemide and lack of medication adherence for additional medications.  Troponin flat, EKG without acute ST-T wave changes.  Hospitalized within the last month noted to have worsened EF 20% with diffuse hypokinesis and inferior akinesis.  Patient required 2 L [place] briefly for comfort.  Was tachycardic during admission shortly during hospitalization until restarted on home medications.  Cardiology was consulted and believed this to be related to medication nonadherence.  Digoxin level less than 0.2 and INR 1 noting patient likely was not taking digoxin or Coumadin.  Discharged on torsemide 40 mg twice daily, Entresto 97/103 mg twice daily, spironolactone 25 mg daily, Coreg 6.25 mg twice daily, digoxin 0.125 mg, Jardiance 25 mg daily.  Transitioned from Coumadin to Xarelto 20 mg due to history of LV thrombus. ? ?Anxiety/depression ?Without active SI but notably low mood and endorsement of not taking medication and "letting what ever happens happen".  Zoloft increased from 25 mg to 50 mg daily.  Patient was not amenable to discussing with family nor counselor. ? ?Right CVA tenderness with suprapubic discomfort ?Patient endorsed chest pressure and generalized pain during hospitalization and was not amenable to trying Tylenol.  Improved with morphine. UA and UDS negative for UTI, kidney stones, pyelonephritis. ? ?Other chronic conditions were medically managed with home medications and formulary alternatives as necessary (T2DM) ? ?PCP Follow-up  Recommendations: ?Consider restarting buspar if patient is having anxiety. This was discontinued to reduce pill burden ?Consider consulting chronic care management to ensure close follow up given medication nonadherence ?

## 2021-10-19 NOTE — TOC Initial Note (Addendum)
Transition of Care (TOC) - Initial/Assessment Note  ? ? ?Patient Details  ?Name: [name] ?MRN: [id number] ?Date of Birth: 08/05/1975 ? ?Transition of Care [facility]) CM/SW Contact:    ?Marcheta Grammes Rexene Alberts, RN ?Phone Number:  [phone] ?10/19/2021, 3:17 PM ? ?Clinical Narrative:                 ? ?HF TOC CM spoke to pt at bedside. Agreeable to HF Paramedicine team to outpatient follow up. Pt states she lives in home alone. Discussed medication and taking meds a prescribed. Explained CM will send message to attending to have meds sent up from [place] prior to dc home. Per pt states she will contact [facility] for transportation home.  ? ? ?Expected Discharge Plan: Home/Self Care ?Barriers to Discharge: No Barriers Identified ? ? ?Patient Goals and CMS Choice ?Patient states their goals for this hospitalization and ongoing recovery are:: patient states she is tired of being sick ?  ?  ? ?Expected Discharge Plan and Services ?Expected Discharge Plan: Home/Self Care ?  ?Discharge Planning Services: CM Consult ?  ?  ?Expected Discharge Date: 10/19/21               ?  ?  ?  ?  ?  ?  ?  ?  ?  ?  ? ?Prior Living Arrangements/Services ?  ?  ?Patient language and need for interpreter reviewed:: Yes ?Do you feel safe going back to the place where you live?: Yes      ?Need for Family Participation in Patient Care: No (Comment) ?Care giver support system in place?: No (comment) ?  ?Criminal Activity/Legal Involvement Pertinent to Current Situation/Hospitalization: No - Comment as needed ? ?Activities of Daily Living ?  ?  ? ?Permission Sought/Granted ?Permission sought to share information with : Case Manager, Family Supports, PCP ?  ?   ?   ?   ?   ? ?Emotional Assessment ?Appearance:: Appears stated age ?Attitude/Demeanor/Rapport: Guarded ?Affect (typically observed): Flat ?Orientation: : Oriented to Self, Oriented to Place, Oriented to  Time, Oriented to Situation ?  ?Psych Involvement: No (comment) ? ?Admission diagnosis:   CHF exacerbation ([place]) [I50.9] ?Hypertensive urgency [I16.0] ?Poorly controlled type 2 diabetes mellitus ([place]) [E11.65] ?Acute on chronic congestive heart failure, unspecified heart failure type ([place]) [I50.9] ?Acute on chronic diastolic CHF (congestive heart failure) ([place]) [I50.33] ?Patient Active Problem List  ? Diagnosis Date Noted  ? Acute on chronic diastolic CHF (congestive heart failure) ([place]) 10/19/2021  ? Depression, recurrent ([place])   ? Tachycardia   ? CHF exacerbation ([place]) 10/17/2021  ? Nonischemic cardiomyopathy ([place]) 09/22/2021  ? Nonobstructive atherosclerosis of coronary artery 09/22/2021  ? History of Left ventricular thrombus 09/22/2021  ? DKA (diabetic ketoacidosis) ([place]) 09/04/2020  ? Hypokalemia 10/13/2019  ? Hyponatremia 10/13/2019  ? Encounter for therapeutic drug monitoring 10/07/2019  ? Acute on chronic congestive heart failure ([place])   ? Hypertensive urgency 09/30/2019  ? Elevated troponin 09/30/2019  ? Hypertensive emergency 09/30/2019  ? Attempting to conceive 03/10/2019  ? Acute combined systolic and diastolic heart failure ([place]) 02/19/2019  ? Asthma   ? Yeast infection 02/06/2019  ? Biventricular failure ([place]) 02/06/2019  ? Morbid obesity ([place]) 02/06/2019  ? Chronic combined systolic and diastolic heart failure ([place]) 12/31/2018  ? Paroxysmal atrial fibrillation ([place]) 12/31/2018  ? Anxiety 12/31/2018  ? Hyperlipidemia associated with type 2 diabetes mellitus ([place]) 10/30/2017  ? Obstructive sleep apnea 06/30/2015  ? Anemia due to blood loss,  chronic 06/27/2012  ? Allergic rhinitis 06/27/2012  ? Major depressive disorder 02/08/2012  ? Hypertension associated with diabetes ([place]) 12/04/2011  ? Dysmenorrhea 11/22/2011  ? Vaginal yeast infection 05/19/2011  ? Poorly controlled type 2 diabetes mellitus ([place]) 05/19/2011  ? PCOS (polycystic ovarian syndrome) 05/19/2011  ? Hydrosalpinx 08/21/2009  ? ?PCP:  Gifford Shave, MD ?Pharmacy:   ?[facility] [place] [place] - [address] ?[place] ?[place] [address] ?Phone: [phone] Fax: [phone] ? ?[facility] ?[place] ?[place] [address] ?Phone: [phone] Fax: [phone] ? ?[facility], [address] ?[place] [address] ?Phone: [phone] Fax: [phone] ? ?[facility] [place] [employer], [address] ?[place] [address] ?Phone: [phone] Fax: [phone] ? ?[facility] [place] ?[place] ?[place] [address] ?Phone: [phone] Fax: [phone] ? ?[facility] ?[address] ?[place] [address] ?Phone: [phone] Fax: [phone] ? ?[facility] ?[address] ?[place] [address] ?Phone: [phone] Fax: [phone] ? ? ? ? ?Social Determinants of Health (SDOH) Interventions ?  ? ?Readmission Risk Interventions ? ?  10/01/2019  ? 12:21 PM 04/02/2019  ?  4:18 PM  ?Readmission Risk Prevention Plan  ?Transportation Screening Complete Complete  ?PCP or Specialist Appt within 3-5 Days Complete Complete  ?[place] or Home Care Consult Complete Complete  ?Social Work Consult for [place] Planning/Counseling Complete Complete  ?Palliative Care Screening Not Applicable Not Applicable  ?Medication Review Press photographer)  Complete  ? ? ? ?

## 2021-10-19 NOTE — Progress Notes (Signed)
OT Cancellation Note ? ?Patient Details ?Name: [name] ?MRN: [id number] ?DOB: [birth year] ? ? ?Cancelled Treatment:    Reason Eval/Treat Not Completed: Patient declined, no reason specified (pt stating she is tired, wanting to rest, requesting therapy come back in afternoon. Will reattempt as schedule allows) ? ?Lynnda Child, OTD, OTR/L ?Acute Rehab ?[phone] ? ? ?Kaylyn Lim ?10/19/2021, 11:26 AM ?

## 2021-10-19 NOTE — Progress Notes (Addendum)
? ? Advanced Heart Failure Rounding Note ? ?PCP-Cardiologist: Loralie Champagne, MD  ? ?Subjective:   ? ?Given IV Lasix yesterday. HF meds resumed. Switched from coumadin to Xarelto given poor compliance.  ? ?1.6L in UOP yesterday. Wt trending down.  ? ?SCr 0.73>>1.19 ?K 4.2  ?Mg 2.6  ? ?Feeling better. No current dyspnea. No current CP.  ? ? ?Objective:   ?Weight Range: ?81 kg ?Body mass index is 36.05 kg/m?.  ? ?Vital Signs:   ?Temp:  [97.6 ?F (36.4 ?C)-98.2 ?F (36.8 ?C)] 98.2 ?F (36.8 ?C) (03/28 0348) ?Pulse Rate:  [89-111] 98 (03/28 0348) ?Resp:  [18-31] 23 (03/28 0348) ?BP: (111-137)/(83-101) 111/93 (03/28 0348) ?SpO2:  [96 %-99 %] 96 % (03/28 0348) ?Weight:  [81 kg] 81 kg (03/28 0348) ?Last BM Date : 10/18/21 ? ?Weight change: ?Filed Weights  ? 10/18/21 0133 10/18/21 0412 10/19/21 0348  ?Weight: 84.8 kg 85 kg 81 kg  ? ? ?Intake/Output:  ? ?Intake/Output Summary (Last 24 hours) at 10/19/2021 0758 ?Last data filed at 10/19/2021 0347 ?Gross per 24 hour  ?Intake 1294.3 ml  ?Output 1550 ml  ?Net -255.7 ml  ?  ? ? ?Physical Exam  ?  ?General:  fatigued/depressed appearing. No resp difficulty ?HEENT: Normal ?Neck: Supple. Thick neck, JVD not well visualized. Carotids 2+ bilat; no bruits. No lymphadenopathy or thyromegaly appreciated. ?Cor: PMI nondisplaced. Regular rate & rhythm. No rubs, gallops or murmurs. ?Lungs: Clear ?Abdomen: Soft, nontender, nondistended. No hepatosplenomegaly. No bruits or masses. Good bowel sounds. ?Extremities: No cyanosis, clubbing, rash, 1+ b/l pretibial edema L>R  ?Neuro: Alert & orientedx3, cranial nerves grossly intact. moves all 4 extremities w/o difficulty. Affect pleasant ? ? ?Telemetry  ? ?NSR 90s personally reviewed  ? ?EKG  ?  ?No new EKG to review  ? ?Labs  ?  ?CBC ?Recent Labs  ?  10/18/21 ?8099 10/19/21 ?0337  ?WBC 12.6* 11.8*  ?HGB 11.3* 12.9  ?HCT 35.1* 40.9  ?MCV 73.9* 75.0*  ?PLT 358 416*  ? ?Basic Metabolic Panel ?Recent Labs  ?  10/18/21 ?8338 10/19/21 ?0337  ?NA 135 135  ?K  3.7 4.2  ?CL 105 101  ?CO2 20* 23  ?GLUCOSE 229* 171*  ?BUN 10 13  ?CREATININE 0.73 1.19*  ?CALCIUM 9.0 9.4  ?MG 1.7 2.6*  ?PHOS 3.1  --   ? ?Liver Function Tests ?Recent Labs  ?  10/19/21 ?0337  ?AST 19  ?ALT 17  ?ALKPHOS 117  ?BILITOT 1.4*  ?PROT 8.3*  ?ALBUMIN 3.2*  ? ?No results for input(s): LIPASE, AMYLASE in the last 72 hours. ?Cardiac Enzymes ?No results for input(s): CKTOTAL, CKMB, CKMBINDEX, TROPONINI in the last 72 hours. ? ?BNP: ?BNP (last 3 results) ?Recent Labs  ?  11/24/20 ?1740 09/17/21 ?2115 10/18/21 ?0540  ?BNP 47.5 304.0* 428.0*  ? ? ?ProBNP (last 3 results) ?No results for input(s): PROBNP in the last 8760 hours. ? ? ?D-Dimer ?Recent Labs  ?  10/18/21 ?1151  ?DDIMER 0.90*  ? ?Hemoglobin A1C ?No results for input(s): HGBA1C in the last 72 hours. ?Fasting Lipid Panel ?No results for input(s): CHOL, HDL, LDLCALC, TRIG, CHOLHDL, LDLDIRECT in the last 72 hours. ?Thyroid Function Tests ?No results for input(s): TSH, T4TOTAL, T3FREE, THYROIDAB in the last 72 hours. ? ?Invalid input(s): FREET3 ? ?Other results: ? ? ?Imaging  ? ? ?No results found. ? ? ?Medications:   ? ? ?Scheduled Medications: ? atorvastatin  40 mg Oral Daily  ? busPIRone  7.5 mg Oral BID  ? carvedilol  6.25 mg Oral BID WC  ? digoxin  0.125 mg Oral Daily  ? empagliflozin  25 mg Oral Daily  ? furosemide  60 mg Intravenous BID  ? gabapentin  100 mg Oral BID  ? insulin aspart  0-15 Units Subcutaneous TID WC  ? insulin glargine-yfgn  18 Units Subcutaneous Daily  ? rivaroxaban  20 mg Oral Q supper  ? sacubitril-valsartan  1 tablet Oral BID  ? sertraline  50 mg Oral Daily  ? spironolactone  25 mg Oral Daily  ? ? ?Infusions: ? ? ?PRN Medications: ?acetaminophen, albuterol, hydrOXYzine, morphine injection, traZODone ? ? ? ?Patient Profile  ? ?46 y/o female with history of chronic systolic CHF/NICM, hx nonobstructive CAD, hx LV thrombus, pAF, DM, HLD, obesity, OSA, noncompliance with medical therapy, PCOS and recent admit for a/c CHF ~ 1 month  ago now readmitted w/ a/c CHF in the setting of medication noncompliance.  ? ?Assessment/Plan  ? ?1.  Acute on Chronic Biventricular Heart Failure:  Nonischemic cardiomyopathy, diagnosed in 6/20. Repeat Echo 03/2019 showed EF 20-25%, diffuse hypokinesis, LV thrombus, moderate RV systolic dysfunction, mild MR, moderate TR. Echo in 3/21 with EF <30%, severe RV dysfunction.  She has a history of paroxysmal atrial fibrillation but seems to be mostly in sinus rhythm.  This is not likely to be a tachycardia-mediated cardiomyopathy.  Poorly controlled diabetes itself can cause a cardiomyopathy, as can poorly controlled HTN.  Myocarditis is a potential cause of her cardiomyopathy. [place] 9/20 showed nonobstructive CAD. RHC showed R>L heart failure with low PAPI suggesting significant RV dysfunction.  Echo in 6/21 with EF up to 35%. Echo 11/21 EF 35-40% Garde I DD. Admit 02/23 with a/c CHF. Echo EF 20%, GIIIDD (restrictive), RV moderately reduced. Only mild MR and TR. R/LHC showed mild, nonobstructive, CAD, elevated right and left heart filling pressures and pulmonary venous hypertension w/ preserved cardiac output w/ Fick CI 2. cMRI LVEF 23%, RVEF 32%, no LGE. ?- Early readmit with a/c CHF. Missed f/u in clinic 03/17.  She apparently was taking 20 mg torsemide tablets 4-5 times a day instead of 40 mg BID.  She was off all HF medications about 3 days prior to admit.     ?- Medications have been restarted. Diuresing w/ IV Lasix. Still w/ mild fluid overload ?- Give another dose of 60 IV Lasix x 1, then switch back to PO torsemide 40 mg bid  ?- Continue Entresto to 97/103 mg BID ?- Continue Spiro 25 mg daily  ?- Continue Coreg at 6.25 mg bid.  ?- Continue Digoxin 0.125 mg. Dig level <0.2 on admit   ?- Continue empagliflozin 25 mg daily.   ?- She previously had been trying to get pregnant (has been difficult with polycystic ovarian syndrome).  She has 2 grown children already. With her cardiomyopathy and active CHF, pregnancy would  create a significant mortality risk for her.  A number of her cardiac meds to control her CHF are feto-toxic. Discussed importance of strict regular birth control/ use of condoms to avoid pregnancy. Negative pregnancy test in ED yesterday. ?- Would need better compliance and blood glucose control (A1c 14 recently) for advanced therapies including heart transplant and LVAD.  ?2. 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  Not seen on cMRI 02/23. ?- previously on coumadin. Given poor compliance/ subtherapeutic INRs, she has been switched to Xarelto  ?3. Chest pain: ?- HS troponin not elevated ?- ECG not suggestive of ACS and had just mild CAD on  recent LHC ?- Suspect chest pressure secondary to volume overload. C/w diuresis  ?4. Sinus tachycardia: ?- Suspect d/t a/c CHF and medication noncompliance ?- improved w/ restart of meds ?- digoxin resumed  ?5. Atrial fibrillation, Paroxysmal: Sinus tach here ?- Given poor compliance w/ coumadin/ subtherapeutic INRs, she is now on Xarelto. ?- Would consider atrial fibrillation ablation in the future, if recurrence, given CASTLE-HF data.  ?6. Mild Nonobstructive CAD: Diffuse 25% p-mRCA, 30% mid LCx lession  ?- continue statin w/ LDL goal < 70 ?- no ASA given chronic coumadin  ?7. Type 2 diabetes: Poorly controlled. ?- Insulin per primary team  ?- c/w Jardiance. Monitor for GU infections  ?8. HTN: BP elevated on admit (non compliant w/ meds). Improved today   ?- medications as above  ?9. Depression/anxiety ?- on Zoloft, Buspar and Hydroxyzine ?- per primary team ?10. Anemia: ?- Hemoglobin 11.3, MCV low. ?- Tsats low ?- Give Feraheme  ?11. Hypomagnesemia/hypokalemia: ?- resolved w/ supplementation  ?- K 4.2, Mg 2.6 today  ?12. SDOH ?- multiple psychosocial issues and h/o poor compliance ?- consult HF TOC CSW/CM - previously enrolled in paramedicine program. Will reengage. ? ?Length of Stay: 0 ? ?Lyda Jester, PA-C  ?10/19/2021, 7:58 AM ? ?Advanced Heart Failure Team ?Pager  [phone] (M-F; 7a - 5p)  ?Please contact [facility] for night-coverage after hours (5p -7a ) and weekends on [email or web address] ? ?Patient seen with PA, agree with the above note.  ? ?HR and BP better co

## 2021-10-19 NOTE — Progress Notes (Signed)
PT Cancellation Note ? ?Patient Details ?Name: [name] ?MRN: [id number] ?DOB: Dec 13, 1975 ? ? ?Cancelled Treatment:    Reason Eval/Treat Not Completed: PT screened, no needs identified, will sign off (new order received. Please refer to Eval completed and filed 3/27.) ? ? ?Gurshan Settlemire B Tristram Milian ?10/19/2021, 6:38 AM ?Dave Mannes P, PT ?Acute Rehabilitation Services ?Pager: [phone] ?Office: [phone] ? ?

## 2021-10-19 NOTE — Progress Notes (Signed)
FPTS Brief Progress Note ? ?S: Went to evaluate patient.  Spoke with nurse prior to ending the room who reported that the patient was doing well and her only wish was that she not be disturbed such that she can sleep.  Reported that her work of breathing had improved and her heart rate did come down. ? ? ?O: ?BP 111/83 (BP Location: Right Arm)   Pulse 89   Temp 97.6 ?F (36.4 ?C) (Oral)   Resp (!) 28   Ht '4\' 11"'$  (1.499 m)   Wt 85 kg   SpO2 97%   BMI 37.85 kg/m?   ? ? ?A/P: ?Patient day team plan, no changes needed at this time ?- Orders reviewed. Labs for AM ordered, which was adjusted as needed.  ? ? ?Gifford Shave, MD ?10/19/2021, 12:06 AM ?PGY-3, [place] Night Resident  ?Please page [phone] with questions.  ?  ?

## 2021-10-19 NOTE — Discharge Summary (Signed)
Family Medicine Teaching Service ?Hospital Discharge Summary ? ?Patient name: [name] Medical record number: [id number] ?Date of birth: [birth year] Age: 46 y.o. Gender: female ?Date of Admission: 10/17/2021  Date of Discharge: 10/19/2021 ?Admitting Physician: Orvis Brill, DO ? ?Primary Care Provider: Gifford Shave, MD ?Consultants: Heart failure ? ?Indication for Hospitalization: Chest pressure and orthopnea ? ?Discharge Diagnoses/Problem List:  ?Principal Problem: ?  CHF exacerbation ([place]) ?Active Problems: ?  Poorly controlled type 2 diabetes mellitus ([place]) ?  Tachycardia ?  Acute on chronic diastolic CHF (congestive heart failure) ([place]) ?  ? ?Disposition: Home ? ?Discharge Condition: Stable ? ?Discharge Exam:  ?Blood pressure 116/66, pulse 87, temperature 98 ?F (36.7 ?C), temperature source Oral, resp. rate (!) 22, height 4' 11" (1.499 m), weight 81 kg, SpO2 95 %.  ?General: Awake, alert, NAD ?Cardiovascular: RRR, no murmurs auscultated ?Respiratory: CTA B, no increased work of breathing, on room air ?Extremities: No pitting edema ? ?Brief Hospital Course:  ?[name] is a 46 y.o.female with a history of HFrEF, HTN, T2DM, OSA, HLD, anxiety, obesity, PCOS, paroxysmal Afib who was admitted to the [facility] Teaching Service at [facility] for respiratory distress and chest pressure 2/2 acute on chronic CHF exacerbation 2/2 medication nonadherence. Her hospital course is detailed below: ? ?Acute on chronic biventricular heart failure ?4 days of worsening shortness of breath and orthopnea with improper dosing of torsemide and lack of medication adherence for additional medications.  Troponin flat, EKG without acute ST-T wave changes.  Hospitalized within the last month noted to have worsened EF 20% with diffuse hypokinesis and inferior akinesis.  Patient required 2 L [place] briefly for comfort.  Was tachycardic during admission shortly during hospitalization until restarted on home medications.  Cardiology  was consulted and believed this to be related to medication nonadherence.  Digoxin level less than 0.2 and INR 1 noting patient likely was not taking digoxin or Coumadin.  Discharged on torsemide 40 mg twice daily, Entresto 97/103 mg twice daily, spironolactone 25 mg daily, Coreg 6.25 mg twice daily, digoxin 0.125 mg, Jardiance 25 mg daily.  Transitioned from Coumadin to Xarelto 20 mg due to history of LV thrombus. ? ?Anxiety/depression ?Without active SI but notably low mood and endorsement of not taking medication and "letting what ever happens happen".  Zoloft increased from 25 mg to 50 mg daily.  Patient was not amenable to discussing with family nor counselor. ? ?Right CVA tenderness with suprapubic discomfort ?Patient endorsed chest pressure and generalized pain during hospitalization and was not amenable to trying Tylenol.  Improved with morphine. UA and UDS negative for UTI, kidney stones, pyelonephritis. ? ?Other chronic conditions were medically managed with home medications and formulary alternatives as necessary (T2DM) ? ?PCP Follow-up Recommendations: ?Consider restarting buspar if patient is having anxiety. This was discontinued to reduce pill burden ?Consider consulting chronic care management to ensure close follow up given medication nonadherence ? ?Significant Procedures: None ? ?Significant Labs and Imaging:  ?Recent Labs  ?Lab 10/17/21 ?1917 10/18/21 ?3762 10/19/21 ?0337  ?WBC 12.6* 12.6* 11.8*  ?HGB 10.6* 11.3* 12.9  ?HCT 34.4* 35.1* 40.9  ?PLT 345 358 416*  ? ?Recent Labs  ?Lab 10/17/21 ?1917 10/18/21 ?8315 10/19/21 ?0337  ?NA 134* 135 135  ?K 3.7 3.7 4.2  ?CL 105 105 101  ?CO2 21* 20* 23  ?GLUCOSE 252* 229* 171*  ?BUN _0 ?CREATININE 0.84 0.73 1.19*  ?CALCIUM 8.7* 9.0 9.4  ?MG  --  1.7 2.6*  ?PHOS  --  3.1  --   ?ALKPHOS  --   --  117  ?AST  --   --  19  ?ALT  --   --  17  ?ALBUMIN  --   --  3.2*  ? ? ?Results/Tests Pending at Time of Discharge: None ? ?Discharge Medications:  ?Allergies  as of 10/19/2021   ? ?   Reactions  ? Shellfish Allergy Swelling  ? Airway involvement including EMS - Has Epi-Pen  ? Dulaglutide Nausea And Vomiting, Other (See Comments)  ? Multiple episodes of vomiting over multiple days  ? Metformin And Related Diarrhea  ? Ibuprofen Other (See Comments)  ? Per PCP interferes with daily meds (heart failure)  ? ?  ? ?  ?Medication List  ?  ? ?STOP taking these medications   ? ?busPIRone 7.5 MG tablet ?Commonly known as: BUSPAR ?  ?gabapentin 100 MG capsule ?Commonly known as: NEURONTIN ?  ?hydrOXYzine 10 MG tablet ?Commonly known as: ATARAX ?  ?meclizine 12.5 MG tablet ?Commonly known as: ANTIVERT ?  ?potassium chloride SA 20 MEQ tablet ?Commonly known as: KLOR-CON M ?  ?sacubitril-valsartan 49-51 MG ?Commonly known as: ENTRESTO ?Replaced by: sacubitril-valsartan 97-103 MG ?  ?traZODone 50 MG tablet ?Commonly known as: DESYREL ?  ?warfarin 7.5 MG tablet ?Commonly known as: COUMADIN ?  ? ?  ? ?TAKE these medications   ? ?Accu-Chek Aviva Plus w/Device Kit ?1 application by Does not apply route QID. Check fasting sugars as well as throughout the day ?  ?Accu-Chek Softclix Lancet Dev Kit ?1 application by Does not apply route 2 (two) times daily. ?  ?Accu-Chek Softclix Lancets lancets ?Use as instructed ?  ?albuterol 108 (90 Base) MCG/ACT inhaler ?Commonly known as: VENTOLIN HFA ?Inhale 2 puffs into the lungs every 6 (six) hours as needed for wheezing or shortness of breath. ?  ?atorvastatin 40 MG tablet ?Commonly known as: LIPITOR ?TAKE 1 TABLET BY MOUTH DAILY AT 6 PM. ?What changed:  ?how much to take ?when to take this ?  ?blood glucose meter kit and supplies Kit ?Dispense based on patient and insurance preference. Use up to four times daily as directed. (FOR ICD-9 250.00, 250.01). ?  ?carvedilol 6.25 MG tablet ?Commonly known as: COREG ?Take 1 tablet (6.25 mg total) by mouth 2 (two) times daily with a meal. ?  ?digoxin 0.125 MG tablet ?Commonly known as: LANOXIN ?Take 1 tablet  (0.125 mg total) by mouth daily. ?  ?empagliflozin 25 MG Tabs tablet ?Commonly known as: JARDIANCE ?Take 25 mg by mouth daily. ?  ?glucose blood test strip ?Commonly known as: AgaMatrix Presto Test ?Use as instructed ?  ?Accu-Chek Aviva Plus test strip ?Generic drug: glucose blood ?Check CBG 2 times per day ?  ?INSULIN SYRINGE .3CC/31GX5/16" 31G X 5/16" 0.3 ML Misc ?15 Units by Does not apply route 2 (two) times daily. ?  ?Lantus SoloStar 100 UNIT/ML Solostar Pen ?Generic drug: insulin glargine ?Inject 36 Units into the skin every morning. ?  ?NovoLOG FlexPen 100 UNIT/ML FlexPen ?Generic drug: insulin aspart ?Inject 12 Units into the skin daily before supper. ?  ?rivaroxaban 20 MG Tabs tablet ?Commonly known as: XARELTO ?Take 1 tablet (20 mg total) by mouth daily with supper. ?  ?sacubitril-valsartan 97-103 MG ?Commonly known as: ENTRESTO ?Take 1 tablet by mouth 2 (two) times daily. ?Replaces: sacubitril-valsartan 49-51 MG ?  ?sertraline 50 MG tablet ?Commonly known as: ZOLOFT ?Take 1 tablet (50 mg total) by mouth daily. ?Start taking on: October 20, 2021 ?  What changed:  ?medication strength ?how much to take ?  ?spironolactone 25 MG tablet ?Commonly known as: ALDACTONE ?Take 1 tablet (25 mg total) by mouth daily. ?Start taking on: October 20, 2021 ?  ?torsemide 20 MG tablet ?Commonly known as: DEMADEX ?Take 2 tablets (40 mg total) by mouth 2 (two) times daily. ?  ?Unifine Pentips 31G X 5 MM Misc ?Generic drug: Insulin Pen Needle ?USE AS DIRECTED 2 TIMES DAILY ?  ? ?  ? ? ?Discharge Instructions: Please refer to Patient Instructions section of EMR for full details.  Patient was counseled important signs and symptoms that should prompt return to medical care, changes in medications, dietary instructions, activity restrictions, and follow up appointments.  ? ?Follow-Up Appointments: ?Future Appointments  ?Date Time Provider [place]  ?10/22/2021  1:30 PM ACCESS TO CARE POOL [facility]  ?10/22/2021  3:30 PM  [facility] PA/NP [facility] None  ?11/22/2021 11:40 AM Larey Dresser, MD [facility] None  ? ? ?Wells Guiles, DO ?10/19/2021, 2:30 PM ?PGY-1, [place] ? ?

## 2021-10-20 ENCOUNTER — Telehealth: Payer: Self-pay

## 2021-10-20 ENCOUNTER — Telehealth: Payer: Self-pay | Admitting: Family Medicine

## 2021-10-20 NOTE — Telephone Encounter (Signed)
.. ?  Medicaid Managed Care  ? ?Unsuccessful Outreach Note ? ?10/20/2021 ?Name: [name] MRN: [id number] DOB: [birth year] ? ?Referred by: Gifford Shave, MD ?Reason for referral : High Risk Managed Medicaid (I called the patient today to get her scheduled with the MM Team. Her VM was full.) ? ? ?An unsuccessful telephone outreach was attempted today. The patient was referred to the case management team for assistance with care management and care coordination.  ? ?Follow Up Plan: The care management team will reach out to the patient again over the next 7 days.  ? ? ?Reita Chard ?Care Guide, High Risk Medicaid Managed Care ?Embedded Care Coordination ?[place]  ? ? ? ?

## 2021-10-20 NOTE — Telephone Encounter (Signed)
Transition Care Management Unsuccessful Follow-up Telephone Call ? ?Date of discharge and from where:  10/19/2021 from [facility] ? ?Attempts:  1st Attempt ? ?Reason for unsuccessful TCM follow-up call:  Left voice message ? ? ? ?

## 2021-10-21 ENCOUNTER — Telehealth (HOSPITAL_COMMUNITY): Payer: Self-pay

## 2021-10-21 NOTE — Telephone Encounter (Signed)
Called and left a voice message to confirm/remind patient of their appointment at the [facility] on 10/22/21.  ? ? ?

## 2021-10-21 NOTE — Telephone Encounter (Signed)
Transition Care Management Unsuccessful Follow-up Telephone Call ? ?Date of discharge and from where:  10/19/2021 from [facility] ? ?Attempts:  2nd Attempt ? ?Reason for unsuccessful TCM follow-up call:  Left voice message ? ? ? ?

## 2021-10-21 NOTE — Progress Notes (Signed)
? ? ?SUBJECTIVE:  ? ?CHIEF COMPLAINT / HPI: hospital f/u ? ?HFrEF, EF 20%: Patient recently admitted to hospital for acute on chronic biventricular HF, in part due to medication non-adherence. Current regimen from discharge is: Discharged on torsemide 40 mg twice daily, Entresto 97/103 mg twice daily, spironolactone 25 mg daily, Coreg 6.25 mg twice daily, digoxin 0.125 mg, Jardiance 25 mg daily.  Transitioned from Coumadin to Xarelto 20 mg due to history of LV thrombus and poor adherence. She reports that she is adherent with these medications. Weight today is slightly above discharge weight, however patient does not have any signs of volume overload. She does not have dyspnea, no peripheral edema. Perhaps our scale in clinic is significantly different from hospital scale to account for difference.  ? ?Chest pain: patient reports that she has sharp pain in her chest that radiates substernally to her back. She was recently admitted to the hospital for this and reports that she got relief from IV morphine and would like something like morphine to give her relief from the pain. She had full cardiac work up that showed normal troponin, ekg without ischemic findings. This is the same pain she had in the hospital, it has not changed. It is not worse with exertion. Patient had R/L heart cath at end of February, that showed only mild, non-obstructive CAD. MR cardiac from 09/20/21, similar to echo: mildly dilated LV with normal wall thickness, diffuse hypokinesis with inferior akinesis, EF 23%. Normal RV size, moderate systolic dysfunction 23%. ? ?Afib: on xarelto '20mg'$ . She reports adherence to the medication. ? ?MDD: patient had low mood and period of SI in hospital during recent admission. Zoloft increased from 25 to 50 mg qd. Since discharge she reports that she has continued to feel terrible. She is overwhelmed by her CHF and wishes to be out of chronic pain. She is not interested in referral to therapy. PHQ-9 today is  10, she circled 1 for question 9. However, she does not have SI today, she says that "I wish I didn't feel pain any more." She circled 1 for her recent thoughts while she was in the hospital. She acknowledges that if she stops her heart medication she will likely suffer more and she is not interested in that. ? ? ?  10/22/2021  ?  1:31 PM 09/27/2021  ?  4:33 PM 08/31/2021  ? 11:15 AM  ?PHQ9 SCORE ONLY  ?PHQ-9 Total Score '10 11 9  '$ ?  ?HTN: on coreg, spironolactone, and entresto as above. BP today is at goal: 119/74. ? ?HC maintenance: last pap smear 2013 NILM, HPV negative. She will need repeat pap as soon as convenient, discussed scheduling follow up for this.  ? ?PERTINENT  PMH / PSH: HFrEF, Afib, MDD, DM2 ? ?OBJECTIVE:  ? ?BP 119/74   Pulse 87   Wt 183 lb 6.4 oz (83.2 kg)   LMP 09/27/2021   SpO2 100%   BMI 37.04 kg/m?   ?Nursing note and vitals reviewed ?GEN: age-appropriate, AAW, resting comfortably in chair, NAD, class II obesity ?Cardiac: Regular rate and rhythm. Normal S1/S2. No murmurs, rubs, or gallops appreciated. 2+ radial pulses. No tenderness to palpation of chest. ?Lungs: Clear bilaterally to ascultation. No increased WOB, no accessory muscle usage. No w/r/r. ?Neuro: AOx3  ?Ext: no edema ?Psych: appropriate, flat affect, tearful when discussing overwhelmed feelings ?ASSESSMENT/PLAN:  ? ?Major depressive disorder ?Severe MDD, no SI today. Given resources, patient declines therapy. Discussed at length the importance of treating mental health  in order to heal physical health. Unfortunately patient is not ready to engage in therapy, unlikely to make much progress until mental health is adequately treated. She recently started zoloft 50 mg, this needs more time to work. Given crisis resources. Follow up in 1 week.  ? ?Hypertension associated with diabetes ([place]) ?Currently controlled, continue regimen listed above. ? ?History of Left ventricular thrombus ?On xarelto, reports adherence. ? ?Chronic combined  systolic and diastolic heart failure ([place]) ?Does not appear to have worsening symptoms today, exam reassuring. She reports adherence to current regimen. Discussed patient should call/follow up closely if her weight continues to increase or she has dyspnea, leg swelling. She has upcoming appt on 5/1 with HF team. ? ?Healthcare maintenance ?Patient has not had a documented pap smear since 2013. Encouraged her to follow up soon for pap smear. ?  ? ? ?Gladys Damme, MD ?[place]  ? ?

## 2021-10-22 ENCOUNTER — Other Ambulatory Visit: Payer: Self-pay

## 2021-10-22 ENCOUNTER — Encounter (HOSPITAL_COMMUNITY): Payer: Self-pay

## 2021-10-22 ENCOUNTER — Other Ambulatory Visit (HOSPITAL_COMMUNITY): Payer: Self-pay

## 2021-10-22 ENCOUNTER — Ambulatory Visit (INDEPENDENT_AMBULATORY_CARE_PROVIDER_SITE_OTHER): Payer: Medicaid Other | Admitting: Family Medicine

## 2021-10-22 ENCOUNTER — Ambulatory Visit (HOSPITAL_COMMUNITY)
Admission: RE | Admit: 2021-10-22 | Discharge: 2021-10-22 | Disposition: A | Discharge status: Home / Self Care | Payer: Medicaid Other | Source: Ambulatory Visit | Attending: Family Medicine | Admitting: Family Medicine

## 2021-10-22 VITALS — BP 119/74 | HR 95 | Ht 59.0 in | Wt 183.6 lb

## 2021-10-22 VITALS — BP 119/74 | HR 87 | Wt 183.4 lb

## 2021-10-22 DIAGNOSIS — R000 Tachycardia, unspecified: Secondary | ICD-10-CM

## 2021-10-22 DIAGNOSIS — Z7984 Long term (current) use of oral hypoglycemic drugs: Secondary | ICD-10-CM | POA: Diagnosis not present

## 2021-10-22 DIAGNOSIS — E785 Hyperlipidemia, unspecified: Secondary | ICD-10-CM | POA: Insufficient documentation

## 2021-10-22 DIAGNOSIS — I081 Rheumatic disorders of both mitral and tricuspid valves: Secondary | ICD-10-CM | POA: Diagnosis not present

## 2021-10-22 DIAGNOSIS — Z5982 Transportation insecurity: Secondary | ICD-10-CM | POA: Insufficient documentation

## 2021-10-22 DIAGNOSIS — E1159 Type 2 diabetes mellitus with other circulatory complications: Secondary | ICD-10-CM

## 2021-10-22 DIAGNOSIS — I110 Hypertensive heart disease with heart failure: Secondary | ICD-10-CM | POA: Diagnosis not present

## 2021-10-22 DIAGNOSIS — I272 Pulmonary hypertension, unspecified: Secondary | ICD-10-CM | POA: Diagnosis not present

## 2021-10-22 DIAGNOSIS — I152 Hypertension secondary to endocrine disorders: Secondary | ICD-10-CM | POA: Diagnosis not present

## 2021-10-22 DIAGNOSIS — I513 Intracardiac thrombosis, not elsewhere classified: Secondary | ICD-10-CM

## 2021-10-22 DIAGNOSIS — Z139 Encounter for screening, unspecified: Secondary | ICD-10-CM

## 2021-10-22 DIAGNOSIS — I5082 Biventricular heart failure: Secondary | ICD-10-CM | POA: Diagnosis not present

## 2021-10-22 DIAGNOSIS — I5033 Acute on chronic diastolic (congestive) heart failure: Secondary | ICD-10-CM | POA: Diagnosis not present

## 2021-10-22 DIAGNOSIS — R0789 Other chest pain: Secondary | ICD-10-CM | POA: Insufficient documentation

## 2021-10-22 DIAGNOSIS — Z7901 Long term (current) use of anticoagulants: Secondary | ICD-10-CM | POA: Insufficient documentation

## 2021-10-22 DIAGNOSIS — I5042 Chronic combined systolic (congestive) and diastolic (congestive) heart failure: Secondary | ICD-10-CM

## 2021-10-22 DIAGNOSIS — I10 Essential (primary) hypertension: Secondary | ICD-10-CM

## 2021-10-22 DIAGNOSIS — Z794 Long term (current) use of insulin: Secondary | ICD-10-CM

## 2021-10-22 DIAGNOSIS — E0821 Diabetes mellitus due to underlying condition with diabetic nephropathy: Secondary | ICD-10-CM

## 2021-10-22 DIAGNOSIS — E282 Polycystic ovarian syndrome: Secondary | ICD-10-CM | POA: Diagnosis not present

## 2021-10-22 DIAGNOSIS — E1165 Type 2 diabetes mellitus with hyperglycemia: Secondary | ICD-10-CM | POA: Diagnosis not present

## 2021-10-22 DIAGNOSIS — F5101 Primary insomnia: Secondary | ICD-10-CM

## 2021-10-22 DIAGNOSIS — I480 Paroxysmal atrial fibrillation: Secondary | ICD-10-CM | POA: Insufficient documentation

## 2021-10-22 DIAGNOSIS — D649 Anemia, unspecified: Secondary | ICD-10-CM | POA: Diagnosis not present

## 2021-10-22 DIAGNOSIS — I2510 Atherosclerotic heart disease of native coronary artery without angina pectoris: Secondary | ICD-10-CM | POA: Insufficient documentation

## 2021-10-22 DIAGNOSIS — Z0000 Encounter for general adult medical examination without abnormal findings: Secondary | ICD-10-CM

## 2021-10-22 DIAGNOSIS — Z79899 Other long term (current) drug therapy: Secondary | ICD-10-CM | POA: Diagnosis not present

## 2021-10-22 DIAGNOSIS — F332 Major depressive disorder, recurrent severe without psychotic features: Secondary | ICD-10-CM

## 2021-10-22 LAB — BASIC METABOLIC PANEL
Anion gap: 8 (ref 5–15)
BUN: 13 mg/dL (ref 6–20)
CO2: 20 mmol/L — ABNORMAL LOW (ref 22–32)
Calcium: 8.9 mg/dL (ref 8.9–10.3)
Chloride: 103 mmol/L (ref 98–111)
Creatinine, Ser: 0.74 mg/dL (ref 0.44–1.00)
GFR, Estimated: >60 mL/min (ref >60)
Glucose, Bld: 154 mg/dL — ABNORMAL HIGH (ref 70–99)
Potassium: 5 mmol/L (ref 3.5–5.1)
Sodium: 131 mmol/L — ABNORMAL LOW (ref 135–145)

## 2021-10-22 LAB — DIGOXIN LEVEL: Digoxin Level: 0.2 ng/mL — ABNORMAL LOW (ref 0.8–2.0)

## 2021-10-22 LAB — BRAIN NATRIURETIC PEPTIDE: B Natriuretic Peptide: 69.7 pg/mL (ref 0.0–100.0)

## 2021-10-22 MED ORDER — TRAZODONE HCL 50 MG PO TABS
50.0000 mg | ORAL_TABLET | Freq: Every day | ORAL | 3 refills | Status: DC
  Filled 2021-10-22: qty 30, 30d supply, fill #0
  Filled 2022-01-21: qty 30, 30d supply, fill #1

## 2021-10-22 MED ORDER — TORSEMIDE 20 MG PO TABS
60.0000 mg | ORAL_TABLET | Freq: Two times a day (BID) | ORAL | 5 refills | Status: DC
  Filled 2021-10-22: qty 180, 30d supply, fill #0

## 2021-10-22 MED ORDER — HYDROXYZINE HCL 10 MG PO TABS
10.0000 mg | ORAL_TABLET | Freq: Three times a day (TID) | ORAL | 3 refills | Status: DC | PRN
Start: 2021-10-22 — End: 2022-10-31
  Filled 2021-10-22: qty 90, 30d supply, fill #0
  Filled 2022-01-21: qty 90, 30d supply, fill #1
  Filled 2022-04-25: qty 90, 30d supply, fill #2
  Filled 2022-09-13: qty 90, 30d supply, fill #3

## 2021-10-22 NOTE — Addendum Note (Signed)
Encounter addended by: Jerl Mina, RN on: 10/22/2021 3:59 PM ? Actions taken: Clinical Note Signed

## 2021-10-22 NOTE — Progress Notes (Signed)
Patient stated she needed transportation although boyfriend now able to transport home. CSW provided bus pass for next visit to assure she is able to make appointment. CSW also provided contact information if needed in the future. Patient grateful for the support and will call if needed. Raquel Sarna, [place], [place] ? ?

## 2021-10-22 NOTE — Progress Notes (Signed)
This is for a hospital follow-up.  She is aware of insurance benefits but was in the hospital so was unable to give a 2 day notice as required by insurance.  Taxi voucher set up and [facility] center will give bus pass for ride home ( appt @ 3:30)  ?

## 2021-10-22 NOTE — Addendum Note (Signed)
Encounter addended by: Jerl Mina, RN on: 10/22/2021 4:06 PM ? Actions taken: Clinical Note Signed

## 2021-10-22 NOTE — Progress Notes (Addendum)
Pt. Refused to arrange follow up appointments. She also stated that she did not want to come back to the clinic anymore. Dr. Aundra Dubin informed of patient not wanting to return to clinic anymore. ? ?Dr. Aundra Dubin has approved her formal dismissal from the [facility], and she can be followed by general Cardiology going forward. ?

## 2021-10-22 NOTE — Patient Instructions (Signed)
It was a pleasure to see you today! ? ?For sleep, you can use trazodone. Start taking 1 pill (50 mg). Lay down and turn lights off because it helps fall asleep, but you will miss window of sleepiness if awake and looking at phone/tv. If no improvement, you can go up by 50 mg every 3 nights. For example, nights 1-3 take 1 pill (50 mg), then if no response you can increase to 2 pills ('100mg'$ ). Stop at 4 pills (200 mg), there is no benefit to taking more ?You can use hydroxyzine up to 3 times per day for anxiety ?Continue taking zoloft 50 mg. We can increase this in the next weeks. It usually takes 4-6 weeks of taking the medication every day to see a difference ?If you would like to see a therapist, we are happy to set that up, please let me know. I have put some resources below ?You are due for a pap smear this year. Please make an appt to schedule that ?Follow up in 1 week. ? ?Be Well, ? ?Dr. Chauncey Reading ? ? ?Therapy and Counseling Resources ?Most providers on this list will take Medicaid. Patients with commercial insurance or Medicare should contact their insurance company to get a list of in network providers. ? ?[facility] (spanish speaking therapist available)(habla espanol)  ?[place], [place], [place] [address], [place] ?[email or web address] ?[phone] ? ?[facility] ?[address] [place], [place] [address] ?[phone] ? ?[facility] ? [address], [place], [place] [address]      [phone] ? ?[facility] ?[place], [place] [address] ?[phone] ? ?[facility] ?[address], [place], [place] [address]       [phone]    ? ?[facility] (virtual only) ?[phone] ? ?[facility] ?[address], [place], [place] ? ?[facility]:  [address] [place] ? ?[facility]:  ?[place] [phone] ? ?[facility] (under & uninsured) ?[address] [place] [phone]    [email or web address]   ? ?[facility] ?[address]  [place]    [phone] ? ?[facility] ?[place]     Phone:  [phone]     [place]  [phone]  ? ?[place] ?[address] [place], [place] ext [phone] ? ?[facility]: [place], [place], [place]  ? ?[facility] ([place] therapist) [email or web address]  ?[place] [address]   [place]  [address]    [phone]   ? ?[facility]   ?[address] [place],  [place], [place]  ? ?[facility]  ?[address] [place]  [phone] ? ?[facility]  ?[place], [place]        [phone] ? ?Open Access/Walk In Clinic under & uninsured ? ?[facility]  ?[place], [place] ?[place] [phone] ?[place]ris[place] [phone] ? ?[facility] [place],  ?([place])   [place]: [phone]) 8:30 - 12; 1 - 2:30 ? ?[facility] [place],  ?[address] [place]    [phone]):8:30 - 12; 2 - 3PM ? ?[facility]  [employer],  ?[address],  [place]; [phone]):   Mon - Fri 8 AM - 5 PM ? ?[facility] ?[place]  MWF 12:30 to 3:00 or call to schedule an appointment  [phone] ? ?Specific Provider options ?[facility]  [email or web address] ?click on find a therapist  ?enter your zip code ?left side and select or tailor a therapist for your specific need.  ? ?[facility] Provider Directory ?[email or web address]  (Medicaid)   Follow all drop down to find a provider ? ?Social Support program ?[place] ?336) H3156881 or [email or web address] ?[address], [place], [place] Recovery support and educational  ? ?24- Hour Availability:  ? ?[facility]  ?[place], [place] ?[place] [phone] ?Crisis [phone] ? ?[facility] [employer] [phone] ? ?[facility]  [phone]  ? ?[place]  [phone] (after hours) ? ?Therapeutic [facility]/Mobile Crisis   [phone] ? ?[place] National Suicide Hotline  445-125-5339 Diamantina Monks) ? ?Call 911 or go to emergency room ? ?[employer]  [phone]);  [place] and [place]  ? ?[facility]  ?([phone]); [place], [place], [place], [place], [place], [place], [place] ? ?

## 2021-10-22 NOTE — Progress Notes (Signed)
ReDS Vest / Clip - 10/22/21 1500   ? ?  ? ReDS Vest / Clip  ? Station Marker A   ? Ruler Value 30   ? ReDS Value Range Moderate volume overload   ? ReDS Actual Value 39   ? ?  ?  ? ?  ? ? ?

## 2021-10-22 NOTE — Progress Notes (Addendum)
? ?ADVANCED HF CLINIC NOTE ? ?Primary Care: Gifford Shave, MD ?HF Cardiologist: Dr. Aundra Dubin ? ?HPI:  ?[name] is a 46 y.o. with history of type 2 diabetes, nonobstructive CAD, hx LV thrombus, pAF, poorly controlled HTN, hyperlipidemia, and chronic systolic CHF of uncertain etiology.  Patient had an echo in 6/20 as outpatient due to exertional dyspnea. This showed EF 20-25%, moderate RV dysfunction.  She was also noted to have paroxysmal atrial fibrillation.  She was started on Xarelto.  In 7/20, she was admitted with DKA. In 8/20, she was admitted with CHF exacerbation and diuresed.  She was sent home on Lasix 40 mg po bid.  ?  ?Readmitted on 9/20 with CHF exacerbation, in the setting of elevated BP.  Diuresed with IV lasix.  LHC showed nonobstructive CAD.  RHC showed R>L heart failure with low PAPI suggesting significant RV dysfunction. She was transitioned to PO torsemide and treated w/ guidelines directed medical therapy w/ Entresto, Spironolactone, Coreg and digoxin. She was also transitioned off of Xarelto and placed on Coumadin. ?  ?Readmitted 0/09/4740 with A/C systolic heart failure. She had not been taking her lasix because she says it was on her AVS to stop it.  Diuresed with IV lasix and transitioned to torsemide 40 mg daily. Weight day before discharge was 190 pounds. Echo with LV EF <20%, RV severely decreased systolic function.  ?  ?Echo in 6/21 showed EF 35%, moderately decreased RV systolic function, moderate LAE, mild MR.  Echo was done today and reviewed, EF 35-40%, global hypokinesis, mild LVH, normal RV, IVC normal. She was also ordered to get a cMRI to look for scar/infiltrative disease and quantify EF.  ? ?She's had multiple admissions over the last few years with a/c CHF. Recurrent trouble with medication compliance.  ?  ?She has had recent issues w/ controlling fluid given intermittent compliance w/ meds, has required adjustments to diuretics. At clinic f/u 2/22, she had stopped  taking her medications because she thought she was pregnant. Urine pregnancy test was negative. Meds restarted. Also w/ poorly controlled DM.  ? ?Admitted 02/23 with a/c CHF. Echo with EF 20%, GIII DD (restrictive), RV moderately reduced, mild MR/TR. R/LHC with mild CAD, elevated right and left filling pressures, pulmonary venous HTN, preserved CO, PAPi 1.5. cMRI LVEF 23%, RVEF 32%, no LGE. GDMT optimized. She missed HF clinic follow-up on 10/08/21. ?  ?Readmitted 3/23 with a/c CHF in setting of medical noncompliance.. Paramedicine re-engaged. ? ?Today she returns for post hospital HF follow up. Continues with chest pressure since discharge. Laying flat makes it worse. Remains generally fatigued. Dyspnea with ADLs. Sleeping on 5 pillows. Denies palpitations, abnormal bleeding, dizziness, or edema. Appetite ok. No fever or chills. Weight at home 182 pounds. Taking all medications.  ? ?ECG (personally reviewed): NSR 90 bpm ? ?ReDs: 39% ? ?Labs (5/21): hgbA1c 9.3, BNP 76, K 3.9, creatinine 1.1 ?Labs (6/21): digoxin 0.4, K 4.1, creatinine 0.77 ?Labs (10/21): BNP 25, K 4.1, creatinine 0.91 ?Labs (11/21): K 3.7, creatinine 0.6, digoxin <0.2 ?Labs (12/21): K 4.2, creatinine 0.83 ?Labs (07/28/2020): K 3.9 Creatinine 0/7  ?Labs ( 09/03/20): K 3.8 Creatinine 0.5 Glucose 428  ?Labs 09/10/20: Pregnancy Test negative.  ?Labs (6/22): SCr 0.93, K 4 ?Labs (10/22): SCr 0.68, K 4.4  ?Labs (12/22): K 4.4, creatinine 0.66 ?Labs (3/23): K 4.2, creatinine 1.19 ?  ?PMH: ?1. Type 2 diabetes: H/o DKA ?2. HTN ?3. OSA ?4. Obesity ?5. Polycystic ovarian syndrome ?6. Chronic systolic CHF: Nonischemic cardiomyopathy ?-  Echo (6/20) with EF 20-25%, moderate RV dysfunction.  ?- LHC/RHC (9/20): No significant CAD.  ?- Echo (3/21): EF <20%, moderate LVH with global HK, severely reduced RV systolic function.  ?- Echo (6/21): EF 35%, moderately decreased RV systolic function, moderate LAE, mild MR. ?- Echo (11/21): EF 35-40%, global hypokinesis, mild LVH,  normal RV, IVC normal.  ?7. Hyperlipidemia ?8. Atrial fibrillation: Paroxysmal.  ?9. History of LV thrombus.  ? ? ?Past Medical History:  ?Diagnosis Date  ? Abnormal Pap smear   ? Acute on chronic combined systolic and diastolic CHF (congestive heart failure) ([place]) 02/19/2019  ? Acute on chronic congestive heart failure ([place])   ? Anemia   ? Asthma   ? Atrial fibrillation ([place])   ? history of paroxysmal atrial fibrillation  ? Diabetes mellitus   ? DKA, type 2 ([place]) 02/06/2019  ? Heart failure with reduced ejection fraction ([place])   ? Hypertension   ? Major depressive disorder 02/08/2012  ? Reports that 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    ? Migraines   ? Morbid obesity ([place]) 02/06/2019  ? Nonischemic cardiomyopathy ([place]) 09/22/2021  ? Obstructive sleep apnea 06/30/2015  ? Polycystic ovarian disease   ? ?Current Outpatient Medications  ?Medication Sig Dispense Refill  ? Accu-Chek Softclix Lancets lancets Use as instructed 100 each 12  ? albuterol (VENTOLIN HFA) 108 (90 Base) MCG/ACT inhaler Inhale 2 puffs into the lungs every 6 (six) hours as needed for wheezing or shortness of breath. 18 g 1  ? atorvastatin (LIPITOR) 40 MG tablet TAKE 1 TABLET BY MOUTH DAILY AT 6 PM. 30 tablet 2  ? blood glucose meter kit and supplies KIT Dispense based on patient and insurance preference. Use up to four times daily as directed. (FOR ICD-9 250.00, 250.01). 1 each 0  ? Blood Glucose Monitoring Suppl (ACCU-CHEK AVIVA PLUS) w/Device KIT 1 application by Does not apply route QID. Check fasting sugars as well as throughout the day 1 kit 0  ? carvedilol (COREG) 6.25 MG tablet Take 1 tablet (6.25 mg total) by mouth 2 (two) times daily with a meal. 60 tablet 1  ? digoxin (LANOXIN) 0.125 MG tablet Take 1 tablet (0.125 mg total) by mouth daily. 30 tablet 3  ? empagliflozin (JARDIANCE) 25 MG TABS tablet Take 25 mg by mouth daily. 90 tablet 3  ? glucose blood (ACCU-CHEK AVIVA PLUS)  test strip Check CBG 2 times per day 100 each 12  ? glucose blood (AGAMATRIX PRESTO TEST) test strip Use as instructed 100 each 12  ? hydrOXYzine (ATARAX) 10 MG tablet Take 1 tablet (10 mg total) by mouth 3 (three) times daily as needed. 90 tablet 3  ? insulin aspart (NOVOLOG FLEXPEN) 100 UNIT/ML FlexPen Inject 12 Units into the skin daily before supper. 15 mL 11  ? insulin glargine (LANTUS) 100 UNIT/ML Solostar Pen Inject 36 Units into the skin every morning. 15 mL 11  ? Insulin Pen Needle (UNIFINE PENTIPS) 31G X 5 MM MISC USE AS DIRECTED 2 TIMES DAILY 100 each 11  ? Insulin Syringe-Needle U-100 (INSULIN SYRINGE .3CC/31GX5/16") 31G X 5/16" 0.3 ML MISC 15 Units by Does not apply route 2 (two) times daily. 100 each 3  ? Lancets Misc. (ACCU-CHEK SOFTCLIX LANCET DEV) KIT 1 application by Does not apply route 2 (two) times daily. 1 kit 0  ? rivaroxaban (XARELTO) 20 MG TABS tablet Take 1 tablet (20 mg total) by mouth  daily with supper. 30 tablet 0  ? sacubitril-valsartan (ENTRESTO) 97-103 MG Take 1 tablet by mouth 2 (two) times daily. 60 tablet 0  ? sertraline (ZOLOFT) 50 MG tablet Take 1 tablet (50 mg total) by mouth daily. 30 tablet 0  ? spironolactone (ALDACTONE) 25 MG tablet Take 1 tablet (25 mg total) by mouth daily. 30 tablet 0  ? torsemide (DEMADEX) 20 MG tablet Take 2 tablets (40 mg total) by mouth 2 (two) times daily. 120 tablet 1  ? traZODone (DESYREL) 50 MG tablet Take 1 tablet (50 mg total) by mouth at bedtime. 30 tablet 3  ? ?No current facility-administered medications for this encounter.  ? ?Allergies  ?Allergen Reactions  ? Shellfish Allergy Swelling  ?  Airway involvement including EMS - Has Epi-Pen  ? Dulaglutide Nausea And Vomiting and Other (See Comments)  ?  Multiple episodes of vomiting over multiple days  ? Metformin And Related Diarrhea  ? Ibuprofen Other (See Comments)  ?  Per PCP interferes with daily meds (heart failure)  ? ?Social History  ? ?Socioeconomic History  ? Marital status: Single  ?   Spouse name: Not on file  ? Number of children: Not on file  ? Years of education: Not on file  ? Highest education level: Not on file  ?Occupational History  ? Not on file  ?Tobacco Use  ? Smoking status:

## 2021-10-22 NOTE — Patient Instructions (Signed)
Medication Changes: ? ?Increase Torsemide to '60mg'$  (3 Tabs) Twice daily ? ? ?Lab Work: ? ?Labs done today, your results will be available in MyChart, we will contact you for abnormal readings. ? ? ?Testing/Procedures: ? ?Repeat blood work in 10 days ? ?Referrals: ? ?none ? ?Special Instructions // Education: ? ?none ? ?Follow-Up in: 6 weeks with APP clinic and 4 months with Dr. Aundra Dubin ( July 2023)  ? ?At the [facility], you and your health needs are our priority. We have a designated team specialized in the treatment of Heart Failure. This Care Team includes your primary Heart Failure Specialized Cardiologist (physician), Advanced Practice Providers (APPs- Physician Assistants and Nurse Practitioners), and Pharmacist who all work together to provide you with the care you need, when you need it.  ? ?You may see any of the following providers on your designated Care Team at your next follow up: ? ?Dr Glori Bickers ?Dr Loralie Champagne ?Darrick Grinder, NP ?Lyda Jester, PA ?Jessica Milford,NP ?Marlyce Huge, PA ?[name], PharmD ? ? ?Please be sure to bring in all your medications bottles to every appointment.  ? ?Need to Contact us: ? ?If you have any questions or concerns before your next appointment please send [place] a message through [place] or call our office at [phone].   ? ?TO LEAVE A MESSAGE FOR THE NURSE SELECT OPTION 2, PLEASE LEAVE A MESSAGE INCLUDING: ?YOUR NAME ?DATE OF BIRTH ?CALL BACK NUMBER ?REASON FOR CALL**this is important as we prioritize the call backs ? ?YOU WILL RECEIVE A CALL BACK THE SAME DAY AS LONG AS YOU CALL BEFORE 4:00 PM ? ? ?

## 2021-10-22 NOTE — Addendum Note (Signed)
Encounter addended by: Louann Liv, LCSW on: 10/22/2021 3:39 PM ? Actions taken: Flowsheet accepted, Clinical Note Signed

## 2021-10-22 NOTE — Telephone Encounter (Signed)
Transition Care Management Follow-up Telephone Call ?Date of discharge and from where: 10/19/2021-[facility]  ?How have you been since you were released from the hospital? Pt stated she is doing fine.  ?Any questions or concerns? No ? ?Items Reviewed: ?Did the pt receive and understand the discharge instructions provided? Yes  ?Medications obtained and verified? Yes  ?Other? No  ?Any new allergies since your discharge? No  ?Dietary orders reviewed? No ?Do you have support at home? Yes  ? ?Home Care and Equipment/Supplies: ?Were home health services ordered? not applicable ?If so, what is the name of the agency? N/A  ?Has the agency set up a time to come to the patient's home? not applicable ?Were any new equipment or medical supplies ordered?  No ?What is the name of the medical supply agency? N/A ?Were you able to get the supplies/equipment? not applicable ?Do you have any questions related to the use of the equipment or supplies? No ? ?Functional Questionnaire: (I = Independent and D = Dependent) ?ADLs: I ? ?Bathing/Dressing- I ? ?Meal Prep- I ? ?Eating- I ? ?Maintaining continence- I ? ?Transferring/Ambulation- I ? ?Managing Meds- I ? ?Follow up appointments reviewed: ? ?PCP Hospital f/u appt confirmed? Yes  Scheduled to see [facility] on 3/31. ?[facility] f/u appt confirmed? Yes  Scheduled to see Cardiology on 3/31. ?Are transportation arrangements needed? No  ?If their condition worsens, is the pt aware to call PCP or go to the Emergency Dept.? Yes ?Was the patient provided with contact information for the PCP's office or ED? Yes ?Was to pt encouraged to call back with questions or concerns? Yes  ?

## 2021-10-22 NOTE — Addendum Note (Signed)
Encounter addended by: Rafael Bihari, FNP on: 10/22/2021 4:50 PM ? Actions taken: Clinical Note Signed, Specialty comments modified

## 2021-10-24 DIAGNOSIS — Z0000 Encounter for general adult medical examination without abnormal findings: Secondary | ICD-10-CM | POA: Insufficient documentation

## 2021-10-24 NOTE — Assessment & Plan Note (Signed)
Does not appear to have worsening symptoms today, exam reassuring. She reports adherence to current regimen. Discussed patient should call/follow up closely if her weight continues to increase or she has dyspnea, leg swelling. She has upcoming appt on 5/1 with HF team. ?

## 2021-10-24 NOTE — Assessment & Plan Note (Signed)
Severe MDD, no SI today. Given resources, patient declines therapy. Discussed at length the importance of treating mental health in order to heal physical health. Unfortunately patient is not ready to engage in therapy, unlikely to make much progress until mental health is adequately treated. She recently started zoloft 50 mg, this needs more time to work. Given crisis resources. Follow up in 1 week.  ?

## 2021-10-24 NOTE — Assessment & Plan Note (Signed)
Currently controlled, continue regimen listed above. ?

## 2021-10-24 NOTE — Assessment & Plan Note (Signed)
On xarelto, reports adherence. ?

## 2021-10-24 NOTE — Assessment & Plan Note (Signed)
Patient has not had a documented pap smear since 2013. Encouraged her to follow up soon for pap smear. ?

## 2021-10-25 ENCOUNTER — Other Ambulatory Visit: Payer: Self-pay

## 2021-10-25 ENCOUNTER — Telehealth (HOSPITAL_COMMUNITY): Payer: Self-pay | Admitting: Licensed Clinical Social Worker

## 2021-10-25 NOTE — Telephone Encounter (Signed)
CSW consulted to get pt set up with [employer].  CSW called pt to discuss enrollment- unable to reach- left VM requesting return call ? ?[name], LCSW ?Clinical Social Worker ?[facility] ?Desk#: [phone] ?Cell#: [phone] ? ?

## 2021-10-27 ENCOUNTER — Telehealth: Payer: Self-pay | Admitting: Licensed Clinical Social Worker

## 2021-10-27 NOTE — Telephone Encounter (Signed)
Reminder regarding Medicaid re-certification process was mailed to pt, encouraging her to ensure that an up to date phone number, address and email is registered with DSS so that she receives re-certification paperwork.  ?  ?Westley Hummer, MSW, LCSW ?Clinical Social Worker II ?[facility]  ?[phone]- work cell phone (preferred) ?[phone]- desk phone ?

## 2021-10-28 ENCOUNTER — Telehealth: Payer: Self-pay | Admitting: Family Medicine

## 2021-10-28 NOTE — Telephone Encounter (Signed)
Called patient as discussed at visit last week. She reports having continued sharp chest pain, just as she did in the hospital. Her weight is the same, no significant change in her dyspnea. She has felt very sad, but is using her support system. For example, she is keeping her 44 yo grand daughter tonight, whom she says brings her great joy. She also confided in her sister yesterday and had a good talk with her. No SI today. We discussed if her SOB and/or weight increases, she should be seen in the ED over the weekend. Patient acknowledges this plan. ? ?Gladys Damme, MD ?[facility], PGY-3 ? ?

## 2021-11-01 ENCOUNTER — Inpatient Hospital Stay (HOSPITAL_COMMUNITY)
Admission: EM | Admit: 2021-11-01 | Discharge: 2021-11-07 | DRG: 292 | Disposition: A | Discharge status: Home / Self Care | Payer: Medicaid Other | Attending: Family Medicine | Admitting: Family Medicine

## 2021-11-01 ENCOUNTER — Other Ambulatory Visit: Payer: Self-pay

## 2021-11-01 ENCOUNTER — Emergency Department (HOSPITAL_COMMUNITY): Payer: Medicaid Other

## 2021-11-01 DIAGNOSIS — E1159 Type 2 diabetes mellitus with other circulatory complications: Secondary | ICD-10-CM | POA: Diagnosis present

## 2021-11-01 DIAGNOSIS — G43909 Migraine, unspecified, not intractable, without status migrainosus: Secondary | ICD-10-CM | POA: Diagnosis not present

## 2021-11-01 DIAGNOSIS — R42 Dizziness and giddiness: Secondary | ICD-10-CM

## 2021-11-01 DIAGNOSIS — K219 Gastro-esophageal reflux disease without esophagitis: Secondary | ICD-10-CM | POA: Diagnosis present

## 2021-11-01 DIAGNOSIS — R0602 Shortness of breath: Secondary | ICD-10-CM

## 2021-11-01 DIAGNOSIS — I482 Chronic atrial fibrillation, unspecified: Secondary | ICD-10-CM | POA: Diagnosis present

## 2021-11-01 DIAGNOSIS — E7800 Pure hypercholesterolemia, unspecified: Secondary | ICD-10-CM | POA: Diagnosis present

## 2021-11-01 DIAGNOSIS — J45909 Unspecified asthma, uncomplicated: Secondary | ICD-10-CM | POA: Diagnosis present

## 2021-11-01 DIAGNOSIS — I152 Hypertension secondary to endocrine disorders: Secondary | ICD-10-CM | POA: Diagnosis present

## 2021-11-01 DIAGNOSIS — R079 Chest pain, unspecified: Secondary | ICD-10-CM | POA: Diagnosis not present

## 2021-11-01 DIAGNOSIS — I2510 Atherosclerotic heart disease of native coronary artery without angina pectoris: Secondary | ICD-10-CM | POA: Diagnosis present

## 2021-11-01 DIAGNOSIS — E861 Hypovolemia: Secondary | ICD-10-CM | POA: Diagnosis not present

## 2021-11-01 DIAGNOSIS — I5042 Chronic combined systolic (congestive) and diastolic (congestive) heart failure: Secondary | ICD-10-CM | POA: Diagnosis present

## 2021-11-01 DIAGNOSIS — G4489 Other headache syndrome: Secondary | ICD-10-CM | POA: Diagnosis not present

## 2021-11-01 DIAGNOSIS — G4700 Insomnia, unspecified: Secondary | ICD-10-CM | POA: Diagnosis present

## 2021-11-01 DIAGNOSIS — F329 Major depressive disorder, single episode, unspecified: Secondary | ICD-10-CM | POA: Diagnosis present

## 2021-11-01 DIAGNOSIS — R112 Nausea with vomiting, unspecified: Secondary | ICD-10-CM | POA: Diagnosis present

## 2021-11-01 DIAGNOSIS — R0789 Other chest pain: Secondary | ICD-10-CM | POA: Diagnosis present

## 2021-11-01 DIAGNOSIS — Z888 Allergy status to other drugs, medicaments and biological substances status: Secondary | ICD-10-CM

## 2021-11-01 DIAGNOSIS — D72829 Elevated white blood cell count, unspecified: Secondary | ICD-10-CM | POA: Diagnosis not present

## 2021-11-01 DIAGNOSIS — G8929 Other chronic pain: Secondary | ICD-10-CM | POA: Diagnosis present

## 2021-11-01 DIAGNOSIS — I5082 Biventricular heart failure: Secondary | ICD-10-CM | POA: Diagnosis present

## 2021-11-01 DIAGNOSIS — N179 Acute kidney failure, unspecified: Secondary | ICD-10-CM | POA: Diagnosis not present

## 2021-11-01 DIAGNOSIS — T501X5A Adverse effect of loop [high-ceiling] diuretics, initial encounter: Secondary | ICD-10-CM | POA: Diagnosis not present

## 2021-11-01 DIAGNOSIS — Z8249 Family history of ischemic heart disease and other diseases of the circulatory system: Secondary | ICD-10-CM

## 2021-11-01 DIAGNOSIS — T502X6A Underdosing of carbonic-anhydrase inhibitors, benzothiadiazides and other diuretics, initial encounter: Secondary | ICD-10-CM | POA: Diagnosis present

## 2021-11-01 DIAGNOSIS — K3189 Other diseases of stomach and duodenum: Secondary | ICD-10-CM | POA: Diagnosis present

## 2021-11-01 DIAGNOSIS — I5043 Acute on chronic combined systolic (congestive) and diastolic (congestive) heart failure: Principal | ICD-10-CM | POA: Diagnosis present

## 2021-11-01 DIAGNOSIS — Z79899 Other long term (current) drug therapy: Secondary | ICD-10-CM

## 2021-11-01 DIAGNOSIS — E282 Polycystic ovarian syndrome: Secondary | ICD-10-CM | POA: Diagnosis present

## 2021-11-01 DIAGNOSIS — Z6835 Body mass index (BMI) 35.0-35.9, adult: Secondary | ICD-10-CM

## 2021-11-01 DIAGNOSIS — F0630 Mood disorder due to known physiological condition, unspecified: Secondary | ICD-10-CM | POA: Diagnosis present

## 2021-11-01 DIAGNOSIS — R7989 Other specified abnormal findings of blood chemistry: Secondary | ICD-10-CM | POA: Diagnosis not present

## 2021-11-01 DIAGNOSIS — N281 Cyst of kidney, acquired: Secondary | ICD-10-CM | POA: Diagnosis present

## 2021-11-01 DIAGNOSIS — Z886 Allergy status to analgesic agent status: Secondary | ICD-10-CM

## 2021-11-01 DIAGNOSIS — I5041 Acute combined systolic (congestive) and diastolic (congestive) heart failure: Secondary | ICD-10-CM | POA: Diagnosis present

## 2021-11-01 DIAGNOSIS — I517 Cardiomegaly: Secondary | ICD-10-CM | POA: Diagnosis not present

## 2021-11-01 DIAGNOSIS — E1169 Type 2 diabetes mellitus with other specified complication: Secondary | ICD-10-CM | POA: Diagnosis present

## 2021-11-01 DIAGNOSIS — I480 Paroxysmal atrial fibrillation: Secondary | ICD-10-CM | POA: Diagnosis present

## 2021-11-01 DIAGNOSIS — I428 Other cardiomyopathies: Secondary | ICD-10-CM | POA: Diagnosis present

## 2021-11-01 DIAGNOSIS — Z7984 Long term (current) use of oral hypoglycemic drugs: Secondary | ICD-10-CM

## 2021-11-01 DIAGNOSIS — R110 Nausea: Secondary | ICD-10-CM

## 2021-11-01 DIAGNOSIS — F40240 Claustrophobia: Secondary | ICD-10-CM | POA: Diagnosis present

## 2021-11-01 DIAGNOSIS — Z7901 Long term (current) use of anticoagulants: Secondary | ICD-10-CM

## 2021-11-01 DIAGNOSIS — G4733 Obstructive sleep apnea (adult) (pediatric): Secondary | ICD-10-CM | POA: Diagnosis present

## 2021-11-01 DIAGNOSIS — F419 Anxiety disorder, unspecified: Secondary | ICD-10-CM | POA: Diagnosis present

## 2021-11-01 DIAGNOSIS — R071 Chest pain on breathing: Secondary | ICD-10-CM | POA: Diagnosis present

## 2021-11-01 DIAGNOSIS — E669 Obesity, unspecified: Secondary | ICD-10-CM | POA: Diagnosis present

## 2021-11-01 DIAGNOSIS — D500 Iron deficiency anemia secondary to blood loss (chronic): Secondary | ICD-10-CM | POA: Diagnosis present

## 2021-11-01 DIAGNOSIS — Z794 Long term (current) use of insulin: Secondary | ICD-10-CM

## 2021-11-01 DIAGNOSIS — E1165 Type 2 diabetes mellitus with hyperglycemia: Secondary | ICD-10-CM | POA: Diagnosis present

## 2021-11-01 DIAGNOSIS — Z833 Family history of diabetes mellitus: Secondary | ICD-10-CM

## 2021-11-01 DIAGNOSIS — H8110 Benign paroxysmal vertigo, unspecified ear: Secondary | ICD-10-CM | POA: Diagnosis present

## 2021-11-01 DIAGNOSIS — Z91138 Patient's unintentional underdosing of medication regimen for other reason: Secondary | ICD-10-CM

## 2021-11-01 DIAGNOSIS — I509 Heart failure, unspecified: Secondary | ICD-10-CM

## 2021-11-01 DIAGNOSIS — F339 Major depressive disorder, recurrent, unspecified: Secondary | ICD-10-CM | POA: Diagnosis present

## 2021-11-01 LAB — I-STAT BETA HCG BLOOD, ED (MC, WL, AP ONLY): I-stat hCG, quantitative: <5 m[IU]/mL (ref <5)

## 2021-11-01 LAB — COMPREHENSIVE METABOLIC PANEL
ALT: 6 U/L (ref 0–44)
AST: 11 U/L — ABNORMAL LOW (ref 15–41)
Albumin: 3 g/dL — ABNORMAL LOW (ref 3.5–5.0)
Alkaline Phosphatase: 92 U/L (ref 38–126)
Anion gap: 7 (ref 5–15)
BUN: 9 mg/dL (ref 6–20)
CO2: 21 mmol/L — ABNORMAL LOW (ref 22–32)
Calcium: 8.9 mg/dL (ref 8.9–10.3)
Chloride: 108 mmol/L (ref 98–111)
Creatinine, Ser: 0.7 mg/dL (ref 0.44–1.00)
GFR, Estimated: >60 mL/min (ref >60)
Glucose, Bld: 166 mg/dL — ABNORMAL HIGH (ref 70–99)
Potassium: 3.6 mmol/L (ref 3.5–5.1)
Sodium: 136 mmol/L (ref 135–145)
Total Bilirubin: 0.8 mg/dL (ref 0.3–1.2)
Total Protein: 7.3 g/dL (ref 6.5–8.1)

## 2021-11-01 LAB — TROPONIN I (HIGH SENSITIVITY)
Troponin I (High Sensitivity): 10 ng/L (ref <18)
Troponin I (High Sensitivity): 9 ng/L (ref <18)

## 2021-11-01 LAB — CBC WITH DIFFERENTIAL/PLATELET
Abs Immature Granulocytes: 0.02 10*3/uL (ref 0.00–0.07)
Basophils Absolute: 0.1 10*3/uL (ref 0.0–0.1)
Basophils Relative: 1 %
Eosinophils Absolute: 0.1 10*3/uL (ref 0.0–0.5)
Eosinophils Relative: 1 %
HCT: 35 % — ABNORMAL LOW (ref 36.0–46.0)
Hemoglobin: 11.1 g/dL — ABNORMAL LOW (ref 12.0–15.0)
Immature Granulocytes: 0 %
Lymphocytes Relative: 35 %
Lymphs Abs: 3.2 10*3/uL (ref 0.7–4.0)
MCH: 24.6 pg — ABNORMAL LOW (ref 26.0–34.0)
MCHC: 31.7 g/dL (ref 30.0–36.0)
MCV: 77.6 fL — ABNORMAL LOW (ref 80.0–100.0)
Monocytes Absolute: 0.5 10*3/uL (ref 0.1–1.0)
Monocytes Relative: 5 %
Neutro Abs: 5.5 10*3/uL (ref 1.7–7.7)
Neutrophils Relative %: 58 %
Platelets: 355 10*3/uL (ref 150–400)
RBC: 4.51 MIL/uL (ref 3.87–5.11)
RDW: 17.4 % — ABNORMAL HIGH (ref 11.5–15.5)
WBC: 9.3 10*3/uL (ref 4.0–10.5)
nRBC: 0 % (ref 0.0–0.2)

## 2021-11-01 LAB — TSH: TSH: 1.73 u[IU]/mL (ref 0.350–4.500)

## 2021-11-01 LAB — DIGOXIN LEVEL: Digoxin Level: <0.2 ng/mL — ABNORMAL LOW (ref 0.8–2.0)

## 2021-11-01 LAB — MAGNESIUM: Magnesium: 1.7 mg/dL (ref 1.7–2.4)

## 2021-11-01 LAB — BRAIN NATRIURETIC PEPTIDE: B Natriuretic Peptide: 181.7 pg/mL — ABNORMAL HIGH (ref 0.0–100.0)

## 2021-11-01 MED ORDER — ONDANSETRON HCL 4 MG/2ML IJ SOLN
4.0000 mg | Freq: Once | INTRAMUSCULAR | Status: AC
  Administered 2021-11-01: 4 mg via INTRAVENOUS
  Filled 2021-11-01: qty 2

## 2021-11-01 MED ORDER — FENTANYL CITRATE PF 50 MCG/ML IJ SOSY
50.0000 ug | PREFILLED_SYRINGE | Freq: Once | INTRAMUSCULAR | Status: AC
  Administered 2021-11-01: 50 ug via INTRAVENOUS
  Filled 2021-11-01: qty 1

## 2021-11-01 NOTE — ED Notes (Signed)
Patient refusing IV access without ultrasound. Patient has bruising from previous admission. Patient tearful at this time and reports that she hates to be back at the hospital. Patient currently kneeling facing the back of the bed and reports this position is the only way she is comfortable breathing. ?

## 2021-11-01 NOTE — ED Triage Notes (Signed)
Pt BIB [facility] from home after calling for dizziness and fatigue today. Patient unable to take meds today d/t nausea. Patient reports decrease in appetite and urine output x5 days. Orthopnea and exertional SOB worse x 3 days. Patient A&O x4. ?

## 2021-11-01 NOTE — ED Provider Notes (Signed)
?[facility] ?Provider Note ? ? ?CSN: [id number] ?Arrival date & time: 11/01/21  1932 ? ?  ? ?History ? ?Chief Complaint  ?Patient presents with  ? Fatigue  ? ? ?[name] is a 46 y.o. female. ? ?The history is provided by the patient and medical records. No language interpreter was used.  ?Chest Pain ?Pain location:  Substernal area ?Pain quality: aching, crushing, pressure, sharp and stabbing   ?Pain radiates to:  Does not radiate ?Pain severity:  Severe ?Onset quality:  Gradual ?Duration:  3 days ?Timing:  Constant ?Progression:  Waxing and waning ?Chronicity:  Recurrent ?Relieved by:  Leaning forward ?Worsened by:  Certain positions and exertion ?Associated symptoms: headache, lower extremity edema, nausea and shortness of breath   ?Associated symptoms: no abdominal pain, no altered mental status, no anxiety, no back pain, no cough, no diaphoresis, no dizziness, no fatigue, no fever, no near-syncope, no numbness, no palpitations, no vomiting and no weakness   ? ?  ? ?Home Medications ?Prior to Admission medications   ?Medication Sig Start Date End Date Taking? Authorizing Provider  ?Accu-Chek Softclix Lancets lancets Use as instructed 02/12/19   Caroline More, DO  ?albuterol (VENTOLIN HFA) 108 (90 Base) MCG/ACT inhaler Inhale 2 puffs into the lungs every 6 (six) hours as needed for wheezing or shortness of breath. 09/16/21 09/16/22  Lilland, Lorrin Goodell, DO  ?atorvastatin (LIPITOR) 40 MG tablet TAKE 1 TABLET BY MOUTH DAILY AT 6 PM. 04/01/20 08/27/22  Larey Dresser, MD  ?blood glucose meter kit and supplies KIT Dispense based on patient and insurance preference. Use up to four times daily as directed. (FOR ICD-9 250.00, 250.01). 09/27/21   Autry-Lott, Naaman Plummer, DO  ?Blood Glucose Monitoring Suppl (ACCU-CHEK AVIVA PLUS) w/Device KIT 1 application by Does not apply route QID. Check fasting sugars as well as throughout the day 02/12/19   Caroline More, DO  ?carvedilol (COREG) 6.25 MG  tablet Take 1 tablet (6.25 mg total) by mouth 2 (two) times daily with a meal. 09/22/21   Gladys Damme, MD  ?digoxin (LANOXIN) 0.125 MG tablet Take 1 tablet (0.125 mg total) by mouth daily. 11/15/19   Lyda Jester M, PA-C  ?empagliflozin (JARDIANCE) 25 MG TABS tablet Take 25 mg by mouth daily. 10/03/19   Pokhrel, Corrie Mckusick, MD  ?glucose blood (ACCU-CHEK AVIVA PLUS) test strip Check CBG 2 times per day 08/16/19   [name], DO  ?glucose blood (AGAMATRIX PRESTO TEST) test strip Use as instructed 10/23/17   Zenia Resides, MD  ?hydrOXYzine (ATARAX) 10 MG tablet Take 1 tablet (10 mg total) by mouth 3 (three) times daily as needed. 10/22/21   Gladys Damme, MD  ?insulin aspart (NOVOLOG FLEXPEN) 100 UNIT/ML FlexPen Inject 12 Units into the skin daily before supper. 09/02/21   Zenia Resides, MD  ?insulin glargine (LANTUS) 100 UNIT/ML Solostar Pen Inject 36 Units into the skin every morning. 09/02/21   Zenia Resides, MD  ?Insulin Pen Needle (UNIFINE PENTIPS) 31G X 5 MM MISC USE AS DIRECTED 2 TIMES DAILY 09/02/21   Zenia Resides, MD  ?Insulin Syringe-Needle U-100 (INSULIN SYRINGE .3CC/31GX5/16") 31G X 5/16" 0.3 ML MISC 15 Units by Does not apply route 2 (two) times daily. 09/05/20   Rai, Vernelle Emerald, MD  ?Lancets Misc. (ACCU-CHEK SOFTCLIX LANCET DEV) KIT 1 application by Does not apply route 2 (two) times daily. 08/16/19   [name], DO  ?rivaroxaban (XARELTO) 20 MG TABS tablet Take 1 tablet (20 mg total)  by mouth daily with supper. 10/19/21   Wells Guiles, DO  ?sacubitril-valsartan (ENTRESTO) 97-103 MG Take 1 tablet by mouth 2 (two) times daily. 10/19/21   Wells Guiles, DO  ?sertraline (ZOLOFT) 50 MG tablet Take 1 tablet (50 mg total) by mouth daily. 10/20/21   Wells Guiles, DO  ?spironolactone (ALDACTONE) 25 MG tablet Take 1 tablet (25 mg total) by mouth daily. 10/20/21   Wells Guiles, DO  ?torsemide (DEMADEX) 20 MG tablet Take 3 tablets (60 mg total) by mouth 2 (two) times daily. 10/22/21   Rafael Bihari, FNP  ?traZODone (DESYREL) 50 MG tablet Take 1 tablet (50 mg total) by mouth at bedtime. 10/22/21   Gladys Damme, MD  ?   ? ?Allergies    ?Shellfish allergy, Dulaglutide, Metformin and related, and Ibuprofen   ? ?Review of Systems   ?Review of Systems  ?Constitutional:  Negative for chills, diaphoresis, fatigue and fever.  ?HENT:  Negative for congestion.   ?Eyes:  Negative for visual disturbance.  ?Respiratory:  Positive for chest tightness and shortness of breath. Negative for cough and wheezing.   ?Cardiovascular:  Positive for chest pain and leg swelling. Negative for palpitations and near-syncope.  ?Gastrointestinal:  Positive for nausea. Negative for abdominal pain, constipation, diarrhea and vomiting.  ?Genitourinary:  Positive for decreased urine volume. Negative for dysuria and frequency.  ?Musculoskeletal:  Negative for back pain, neck pain and neck stiffness.  ?Skin:  Negative for rash and wound.  ?Neurological:  Positive for headaches. Negative for dizziness, weakness, light-headedness and numbness.  ?Psychiatric/Behavioral:  Negative for agitation and confusion.   ?All other systems reviewed and are negative. ? ?Physical Exam ?Updated Vital Signs ?BP (!) 157/105 (BP Location: Right Arm)   Pulse (!) 108   Temp 98.3 ?F (36.8 ?C) (Oral)   Resp (!) 26   SpO2 100%  ?Physical Exam ?Vitals and nursing note reviewed.  ?Constitutional:   ?   General: She is not in acute distress. ?   Appearance: She is well-developed. She is not ill-appearing, toxic-appearing or diaphoretic.  ?HENT:  ?   Head: Normocephalic and atraumatic.  ?   Nose: No congestion or rhinorrhea.  ?   Mouth/Throat:  ?   Mouth: Mucous membranes are moist.  ?   Pharynx: No oropharyngeal exudate or posterior oropharyngeal erythema.  ?Eyes:  ?   Extraocular Movements: Extraocular movements intact.  ?   Conjunctiva/sclera: Conjunctivae normal.  ?   Pupils: Pupils are equal, round, and reactive to light.  ?Cardiovascular:  ?   Rate and  Rhythm: Regular rhythm. Tachycardia present.  ?   Heart sounds: No murmur heard. ?Pulmonary:  ?   Effort: Pulmonary effort is normal. No respiratory distress.  ?   Breath sounds: Rales present. No wheezing or rhonchi.  ?Chest:  ?   Chest wall: Tenderness present.  ?Abdominal:  ?   General: Abdomen is flat.  ?   Palpations: Abdomen is soft.  ?   Tenderness: There is no abdominal tenderness. There is no right CVA tenderness, left CVA tenderness, guarding or rebound.  ?Musculoskeletal:     ?   General: No swelling.  ?   Cervical back: Neck supple. No tenderness.  ?   Right lower leg: Edema present.  ?   Left lower leg: Edema present.  ?Skin: ?   General: Skin is warm and dry.  ?   Capillary Refill: Capillary refill takes less than 2 seconds.  ?   Findings: No erythema or rash.  ?  Neurological:  ?   General: No focal deficit present.  ?   Mental Status: She is alert.  ?   Sensory: No sensory deficit.  ?   Motor: No weakness.  ?Psychiatric:     ?   Mood and Affect: Mood is depressed.  ? ? ?ED Results / Procedures / Treatments   ?Labs ?(all labs ordered are listed, but only abnormal results are displayed) ?Labs Reviewed  ?COMPREHENSIVE METABOLIC PANEL - Abnormal; Notable for the following components:  ?    Result Value  ? CO2 21 (*)   ? Glucose, Bld 166 (*)   ? Albumin 3.0 (*)   ? AST 11 (*)   ? All other components within normal limits  ?DIGOXIN LEVEL - Abnormal; Notable for the following components:  ? Digoxin Level <0.2 (*)   ? All other components within normal limits  ?CBC WITH DIFFERENTIAL/PLATELET - Abnormal; Notable for the following components:  ? Hemoglobin 11.1 (*)   ? HCT 35.0 (*)   ? MCV 77.6 (*)   ? MCH 24.6 (*)   ? RDW 17.4 (*)   ? All other components within normal limits  ?BRAIN NATRIURETIC PEPTIDE - Abnormal; Notable for the following components:  ? B Natriuretic Peptide 181.7 (*)   ? All other components within normal limits  ?MAGNESIUM  ?TSH  ?CBC WITH DIFFERENTIAL/PLATELET  ?URINALYSIS, ROUTINE W  REFLEX MICROSCOPIC  ?I-STAT BETA HCG BLOOD, ED ([facility], [facility], [facility] ONLY)  ?TROPONIN I (HIGH SENSITIVITY)  ?TROPONIN I (HIGH SENSITIVITY)  ? ? ?EKG ?EKG Interpretation ? ?Date/Time:  Monday November 01 2021 19:44:55 EDT ?[place]

## 2021-11-02 ENCOUNTER — Encounter (HOSPITAL_COMMUNITY): Payer: Self-pay | Admitting: Family Medicine

## 2021-11-02 ENCOUNTER — Observation Stay (HOSPITAL_COMMUNITY): Payer: Medicaid Other

## 2021-11-02 ENCOUNTER — Inpatient Hospital Stay (HOSPITAL_COMMUNITY): Payer: Medicaid Other

## 2021-11-02 DIAGNOSIS — N2889 Other specified disorders of kidney and ureter: Secondary | ICD-10-CM | POA: Diagnosis not present

## 2021-11-02 DIAGNOSIS — I5043 Acute on chronic combined systolic (congestive) and diastolic (congestive) heart failure: Secondary | ICD-10-CM | POA: Diagnosis not present

## 2021-11-02 DIAGNOSIS — R112 Nausea with vomiting, unspecified: Secondary | ICD-10-CM | POA: Diagnosis not present

## 2021-11-02 DIAGNOSIS — E7800 Pure hypercholesterolemia, unspecified: Secondary | ICD-10-CM | POA: Diagnosis not present

## 2021-11-02 DIAGNOSIS — R0789 Other chest pain: Secondary | ICD-10-CM | POA: Diagnosis not present

## 2021-11-02 DIAGNOSIS — I428 Other cardiomyopathies: Secondary | ICD-10-CM | POA: Diagnosis not present

## 2021-11-02 DIAGNOSIS — R079 Chest pain, unspecified: Secondary | ICD-10-CM

## 2021-11-02 DIAGNOSIS — I5082 Biventricular heart failure: Secondary | ICD-10-CM | POA: Diagnosis not present

## 2021-11-02 DIAGNOSIS — R110 Nausea: Secondary | ICD-10-CM | POA: Diagnosis not present

## 2021-11-02 DIAGNOSIS — R42 Dizziness and giddiness: Secondary | ICD-10-CM

## 2021-11-02 DIAGNOSIS — I130 Hypertensive heart and chronic kidney disease with heart failure and stage 1 through stage 4 chronic kidney disease, or unspecified chronic kidney disease: Secondary | ICD-10-CM | POA: Diagnosis not present

## 2021-11-02 DIAGNOSIS — G4733 Obstructive sleep apnea (adult) (pediatric): Secondary | ICD-10-CM | POA: Diagnosis not present

## 2021-11-02 DIAGNOSIS — I509 Heart failure, unspecified: Secondary | ICD-10-CM

## 2021-11-02 DIAGNOSIS — G4700 Insomnia, unspecified: Secondary | ICD-10-CM | POA: Diagnosis not present

## 2021-11-02 DIAGNOSIS — F339 Major depressive disorder, recurrent, unspecified: Secondary | ICD-10-CM | POA: Diagnosis not present

## 2021-11-02 DIAGNOSIS — F0630 Mood disorder due to known physiological condition, unspecified: Secondary | ICD-10-CM | POA: Diagnosis not present

## 2021-11-02 DIAGNOSIS — N179 Acute kidney failure, unspecified: Secondary | ICD-10-CM | POA: Diagnosis not present

## 2021-11-02 DIAGNOSIS — I2510 Atherosclerotic heart disease of native coronary artery without angina pectoris: Secondary | ICD-10-CM | POA: Diagnosis not present

## 2021-11-02 DIAGNOSIS — J45909 Unspecified asthma, uncomplicated: Secondary | ICD-10-CM | POA: Diagnosis not present

## 2021-11-02 DIAGNOSIS — I5041 Acute combined systolic (congestive) and diastolic (congestive) heart failure: Secondary | ICD-10-CM | POA: Diagnosis not present

## 2021-11-02 DIAGNOSIS — I517 Cardiomegaly: Secondary | ICD-10-CM | POA: Diagnosis not present

## 2021-11-02 DIAGNOSIS — E1122 Type 2 diabetes mellitus with diabetic chronic kidney disease: Secondary | ICD-10-CM | POA: Diagnosis not present

## 2021-11-02 DIAGNOSIS — I482 Chronic atrial fibrillation, unspecified: Secondary | ICD-10-CM | POA: Diagnosis not present

## 2021-11-02 DIAGNOSIS — E282 Polycystic ovarian syndrome: Secondary | ICD-10-CM | POA: Diagnosis not present

## 2021-11-02 DIAGNOSIS — I152 Hypertension secondary to endocrine disorders: Secondary | ICD-10-CM | POA: Diagnosis not present

## 2021-11-02 DIAGNOSIS — I5020 Unspecified systolic (congestive) heart failure: Secondary | ICD-10-CM | POA: Diagnosis not present

## 2021-11-02 DIAGNOSIS — I5042 Chronic combined systolic (congestive) and diastolic (congestive) heart failure: Secondary | ICD-10-CM | POA: Diagnosis not present

## 2021-11-02 DIAGNOSIS — R071 Chest pain on breathing: Secondary | ICD-10-CM | POA: Diagnosis not present

## 2021-11-02 DIAGNOSIS — I480 Paroxysmal atrial fibrillation: Secondary | ICD-10-CM | POA: Diagnosis not present

## 2021-11-02 DIAGNOSIS — E861 Hypovolemia: Secondary | ICD-10-CM | POA: Diagnosis not present

## 2021-11-02 DIAGNOSIS — G8929 Other chronic pain: Secondary | ICD-10-CM | POA: Diagnosis not present

## 2021-11-02 DIAGNOSIS — E669 Obesity, unspecified: Secondary | ICD-10-CM | POA: Diagnosis not present

## 2021-11-02 DIAGNOSIS — E1169 Type 2 diabetes mellitus with other specified complication: Secondary | ICD-10-CM | POA: Diagnosis not present

## 2021-11-02 DIAGNOSIS — D500 Iron deficiency anemia secondary to blood loss (chronic): Secondary | ICD-10-CM | POA: Diagnosis not present

## 2021-11-02 DIAGNOSIS — R0602 Shortness of breath: Secondary | ICD-10-CM

## 2021-11-02 DIAGNOSIS — E1165 Type 2 diabetes mellitus with hyperglycemia: Secondary | ICD-10-CM | POA: Diagnosis not present

## 2021-11-02 DIAGNOSIS — D72829 Elevated white blood cell count, unspecified: Secondary | ICD-10-CM | POA: Diagnosis not present

## 2021-11-02 DIAGNOSIS — H8110 Benign paroxysmal vertigo, unspecified ear: Secondary | ICD-10-CM | POA: Diagnosis not present

## 2021-11-02 HISTORY — DX: Heart failure, unspecified: I50.9

## 2021-11-02 LAB — COMPREHENSIVE METABOLIC PANEL
ALT: 7 U/L (ref 0–44)
AST: 12 U/L — ABNORMAL LOW (ref 15–41)
Albumin: 2.8 g/dL — ABNORMAL LOW (ref 3.5–5.0)
Alkaline Phosphatase: 85 U/L (ref 38–126)
Anion gap: 7 (ref 5–15)
BUN: 10 mg/dL (ref 6–20)
CO2: 22 mmol/L (ref 22–32)
Calcium: 8.9 mg/dL (ref 8.9–10.3)
Chloride: 108 mmol/L (ref 98–111)
Creatinine, Ser: 0.82 mg/dL (ref 0.44–1.00)
GFR, Estimated: >60 mL/min (ref >60)
Glucose, Bld: 245 mg/dL — ABNORMAL HIGH (ref 70–99)
Potassium: 3.8 mmol/L (ref 3.5–5.1)
Sodium: 137 mmol/L (ref 135–145)
Total Bilirubin: 0.7 mg/dL (ref 0.3–1.2)
Total Protein: 7 g/dL (ref 6.5–8.1)

## 2021-11-02 LAB — CBC
HCT: 35.8 % — ABNORMAL LOW (ref 36.0–46.0)
Hemoglobin: 10.9 g/dL — ABNORMAL LOW (ref 12.0–15.0)
MCH: 23.9 pg — ABNORMAL LOW (ref 26.0–34.0)
MCHC: 30.4 g/dL (ref 30.0–36.0)
MCV: 78.5 fL — ABNORMAL LOW (ref 80.0–100.0)
Platelets: 351 10*3/uL (ref 150–400)
RBC: 4.56 MIL/uL (ref 3.87–5.11)
RDW: 17.5 % — ABNORMAL HIGH (ref 11.5–15.5)
WBC: 10 10*3/uL (ref 4.0–10.5)
nRBC: 0 % (ref 0.0–0.2)

## 2021-11-02 LAB — URINALYSIS, ROUTINE W REFLEX MICROSCOPIC
Bacteria, UA: NONE SEEN
Bilirubin Urine: NEGATIVE
Glucose, UA: NEGATIVE mg/dL
Hgb urine dipstick: NEGATIVE
Ketones, ur: 5 mg/dL — AB
Leukocytes,Ua: NEGATIVE
Nitrite: NEGATIVE
Protein, ur: 30 mg/dL — AB
Specific Gravity, Urine: 1.031 — ABNORMAL HIGH (ref 1.005–1.030)
pH: 5 (ref 5.0–8.0)

## 2021-11-02 LAB — BASIC METABOLIC PANEL
Anion gap: 8 (ref 5–15)
BUN: 7 mg/dL (ref 6–20)
CO2: 27 mmol/L (ref 22–32)
Calcium: 9.4 mg/dL (ref 8.9–10.3)
Chloride: 100 mmol/L (ref 98–111)
Creatinine, Ser: 0.88 mg/dL (ref 0.44–1.00)
GFR, Estimated: >60 mL/min (ref >60)
Glucose, Bld: 102 mg/dL — ABNORMAL HIGH (ref 70–99)
Potassium: 3.6 mmol/L (ref 3.5–5.1)
Sodium: 135 mmol/L (ref 135–145)

## 2021-11-02 LAB — GLUCOSE, CAPILLARY
Glucose-Capillary: 157 mg/dL — ABNORMAL HIGH (ref 70–99)
Glucose-Capillary: 200 mg/dL — ABNORMAL HIGH (ref 70–99)

## 2021-11-02 LAB — ECHOCARDIOGRAM COMPLETE
AR max vel: 2.59 cm2
AV Area VTI: 2.33 cm2
AV Area mean vel: 2.21 cm2
AV Mean grad: 3 mmHg
AV Peak grad: 4.9 mmHg
Ao pk vel: 1.11 m/s
Area-P 1/2: 4.29 cm2
S' Lateral: 4.6 cm

## 2021-11-02 LAB — CBG MONITORING, ED
Glucose-Capillary: 223 mg/dL — ABNORMAL HIGH (ref 70–99)
Glucose-Capillary: 186 mg/dL — ABNORMAL HIGH (ref 70–99)

## 2021-11-02 LAB — PROTIME-INR
INR: 1.1 (ref 0.8–1.2)
Prothrombin Time: 13.8 seconds (ref 11.4–15.2)

## 2021-11-02 LAB — HEMOGLOBIN A1C
Hgb A1c MFr Bld: 9 % — ABNORMAL HIGH (ref 4.8–5.6)
Mean Plasma Glucose: 211.6 mg/dL

## 2021-11-02 MED ORDER — PANTOPRAZOLE SODIUM 40 MG PO TBEC
40.0000 mg | DELAYED_RELEASE_TABLET | Freq: Every day | ORAL | Status: DC
  Administered 2021-11-02 – 2021-11-07 (×6): 40 mg via ORAL
  Filled 2021-11-02 (×6): qty 1

## 2021-11-02 MED ORDER — CARVEDILOL 6.25 MG PO TABS
6.2500 mg | ORAL_TABLET | Freq: Two times a day (BID) | ORAL | Status: DC
  Administered 2021-11-02 – 2021-11-07 (×12): 6.25 mg via ORAL
  Filled 2021-11-02 (×8): qty 1
  Filled 2021-11-02: qty 2
  Filled 2021-11-02 (×2): qty 1

## 2021-11-02 MED ORDER — KETOROLAC TROMETHAMINE 15 MG/ML IJ SOLN
15.0000 mg | Freq: Once | INTRAMUSCULAR | Status: AC
  Administered 2021-11-02: 15 mg via INTRAVENOUS
  Filled 2021-11-02: qty 1

## 2021-11-02 MED ORDER — ATORVASTATIN CALCIUM 40 MG PO TABS
40.0000 mg | ORAL_TABLET | Freq: Every day | ORAL | Status: DC
  Administered 2021-11-02 – 2021-11-07 (×6): 40 mg via ORAL
  Filled 2021-11-02 (×6): qty 1

## 2021-11-02 MED ORDER — MAGNESIUM SULFATE 2 GM/50ML IV SOLN
2.0000 g | Freq: Once | INTRAVENOUS | Status: AC
  Administered 2021-11-02: 2 g via INTRAVENOUS
  Filled 2021-11-02: qty 50

## 2021-11-02 MED ORDER — INSULIN ASPART 100 UNIT/ML IJ SOLN
0.0000 [IU] | Freq: Three times a day (TID) | INTRAMUSCULAR | Status: DC
  Administered 2021-11-02: 3 [IU] via SUBCUTANEOUS
  Administered 2021-11-02: 5 [IU] via SUBCUTANEOUS
  Administered 2021-11-02 – 2021-11-03 (×2): 3 [IU] via SUBCUTANEOUS
  Administered 2021-11-03 (×2): 8 [IU] via SUBCUTANEOUS
  Administered 2021-11-04: 5 [IU] via SUBCUTANEOUS
  Administered 2021-11-04: 3 [IU] via SUBCUTANEOUS
  Administered 2021-11-04: 2 [IU] via SUBCUTANEOUS
  Administered 2021-11-05: 3 [IU] via SUBCUTANEOUS
  Administered 2021-11-05: 6 [IU] via SUBCUTANEOUS
  Administered 2021-11-06: 3 [IU] via SUBCUTANEOUS
  Administered 2021-11-06 (×2): 2 [IU] via SUBCUTANEOUS
  Administered 2021-11-07: 3 [IU] via SUBCUTANEOUS
  Administered 2021-11-07: 2 [IU] via SUBCUTANEOUS

## 2021-11-02 MED ORDER — ACETAMINOPHEN 650 MG RE SUPP
650.0000 mg | Freq: Four times a day (QID) | RECTAL | Status: DC | PRN
Start: 2021-11-02 — End: 2021-11-08

## 2021-11-02 MED ORDER — EMPAGLIFLOZIN 25 MG PO TABS
25.0000 mg | ORAL_TABLET | Freq: Every day | ORAL | Status: DC
  Administered 2021-11-02 – 2021-11-03 (×2): 25 mg via ORAL
  Filled 2021-11-02 (×3): qty 1

## 2021-11-02 MED ORDER — TRAZODONE HCL 50 MG PO TABS
50.0000 mg | ORAL_TABLET | Freq: Every evening | ORAL | Status: DC | PRN
  Administered 2021-11-02 – 2021-11-06 (×5): 50 mg via ORAL
  Filled 2021-11-02 (×5): qty 1

## 2021-11-02 MED ORDER — DICLOFENAC SODIUM 1 % EX GEL
2.0000 g | Freq: Four times a day (QID) | CUTANEOUS | Status: DC
  Administered 2021-11-02 – 2021-11-05 (×5): 2 g via TOPICAL
  Filled 2021-11-02: qty 100

## 2021-11-02 MED ORDER — MORPHINE SULFATE (PF) 2 MG/ML IV SOLN
2.0000 mg | INTRAVENOUS | Status: AC | PRN
  Administered 2021-11-02 (×2): 2 mg via INTRAVENOUS
  Filled 2021-11-02 (×2): qty 1

## 2021-11-02 MED ORDER — IOHEXOL 9 MG/ML PO SOLN: 500.0000 mL | ORAL | Status: AC

## 2021-11-02 MED ORDER — ACETAMINOPHEN 325 MG PO TABS
650.0000 mg | ORAL_TABLET | Freq: Four times a day (QID) | ORAL | Status: DC | PRN
Start: 2021-11-02 — End: 2021-11-08
  Administered 2021-11-04 – 2021-11-05 (×2): 650 mg via ORAL
  Filled 2021-11-02 (×2): qty 2

## 2021-11-02 MED ORDER — SPIRONOLACTONE 25 MG PO TABS
25.0000 mg | ORAL_TABLET | Freq: Every day | ORAL | Status: DC
  Administered 2021-11-02 – 2021-11-03 (×2): 25 mg via ORAL
  Filled 2021-11-02 (×2): qty 1

## 2021-11-02 MED ORDER — SERTRALINE HCL 50 MG PO TABS
50.0000 mg | ORAL_TABLET | Freq: Every day | ORAL | Status: DC
Start: 2021-11-02 — End: 2021-11-08
  Administered 2021-11-02 – 2021-11-07 (×6): 50 mg via ORAL
  Filled 2021-11-02 (×6): qty 1

## 2021-11-02 MED ORDER — TORSEMIDE 20 MG PO TABS
60.0000 mg | ORAL_TABLET | Freq: Two times a day (BID) | ORAL | Status: DC
  Administered 2021-11-02: 60 mg via ORAL
  Filled 2021-11-02 (×2): qty 3

## 2021-11-02 MED ORDER — ONDANSETRON 4 MG PO TBDP
4.0000 mg | ORAL_TABLET | Freq: Three times a day (TID) | ORAL | Status: DC | PRN
  Administered 2021-11-02 – 2021-11-03 (×4): 4 mg via ORAL
  Filled 2021-11-02 (×6): qty 1

## 2021-11-02 MED ORDER — RIVAROXABAN 20 MG PO TABS
20.0000 mg | ORAL_TABLET | Freq: Every day | ORAL | Status: DC
Start: 2021-11-02 — End: 2021-11-04
  Administered 2021-11-02 – 2021-11-03 (×3): 20 mg via ORAL
  Filled 2021-11-02: qty 1
  Filled 2021-11-02: qty 2
  Filled 2021-11-02 (×2): qty 1

## 2021-11-02 MED ORDER — SACUBITRIL-VALSARTAN 97-103 MG PO TABS
1.0000 | ORAL_TABLET | Freq: Two times a day (BID) | ORAL | Status: DC
  Administered 2021-11-02 (×2): 1 via ORAL
  Filled 2021-11-02 (×4): qty 1

## 2021-11-02 MED ORDER — DIGOXIN 125 MCG PO TABS
0.1250 mg | ORAL_TABLET | Freq: Every day | ORAL | Status: DC
  Administered 2021-11-02: 0.125 mg via ORAL
  Filled 2021-11-02 (×2): qty 1

## 2021-11-02 MED ORDER — POTASSIUM CHLORIDE 20 MEQ PO PACK
40.0000 meq | PACK | Freq: Once | ORAL | Status: AC
  Administered 2021-11-02: 40 meq via ORAL
  Filled 2021-11-02: qty 2

## 2021-11-02 MED ORDER — IOHEXOL 9 MG/ML PO SOLN
ORAL | Status: AC
  Administered 2021-11-02: 500 mL
  Filled 2021-11-02: qty 1000

## 2021-11-02 MED ORDER — FUROSEMIDE 10 MG/ML IJ SOLN
60.0000 mg | Freq: Two times a day (BID) | INTRAMUSCULAR | Status: DC
  Administered 2021-11-02 (×2): 60 mg via INTRAVENOUS
  Filled 2021-11-02 (×2): qty 6

## 2021-11-02 MED ORDER — INSULIN GLARGINE-YFGN 100 UNIT/ML ~~LOC~~ SOLN
18.0000 [IU] | Freq: Every day | SUBCUTANEOUS | Status: DC
  Administered 2021-11-02 – 2021-11-03 (×2): 18 [IU] via SUBCUTANEOUS
  Filled 2021-11-02 (×3): qty 0.18

## 2021-11-02 MED ORDER — HYDROXYZINE HCL 10 MG PO TABS
10.0000 mg | ORAL_TABLET | Freq: Three times a day (TID) | ORAL | Status: DC | PRN
  Administered 2021-11-02 – 2021-11-06 (×5): 10 mg via ORAL
  Filled 2021-11-02 (×6): qty 1

## 2021-11-02 NOTE — ED Notes (Signed)
Breakfast order placed ?

## 2021-11-02 NOTE — Progress Notes (Signed)
FPTS Brief Progress Note ? ?Received page that patient is requesting morphine for chest pain, went to see patient. Patient reports that pain is the same pain she's had prior to admission and that it was treated with morphine while in the ED. I explained to patient that we'd performed test to rule out scary causes of the chest pain like a heart attack, and that her pain appears to be musculoskeletal. Told patient that I'd looked through chart and observed she'd had this issue in the past. Informed patient that we do not treat this pain with morphine, and that Tylenol or NSAID would be more appropriate. Patient hadn't received any tylenol today, but reported that tylenol doesn't work. I told patient we could try something else, NSAID, that is strong and works well for this type of pain, similar to ibuprofen. Patient declined medicine and was instantly frustrated. Patient reported that she was tired and wanted to go home. I told patient we were still doing a lot of her, and asked if we could try another pain medicine instead. Patient asked me to leave room and said she didn't want anything at this time. I went to update nurse, who was unavailable at time. Left note with floor secretary informing nurse of the interaction I'd had with patient. I asked the nurse to check on patient, to assess if she want's to go home, and to call the team once she had.  ? Holley Bouche, MD ?11/02/2021, 6:12 PM ?PGY-1, [place] [facility] Night Resident  ?Please page [phone] with questions.  ? ? ?

## 2021-11-02 NOTE — ED Notes (Signed)
Endo at bedside. Unable to give meds at this time  ?

## 2021-11-02 NOTE — Progress Notes (Signed)
Echocardiogram ?2D Echocardiogram has been performed. ? ?Arlyss Gandy ?11/02/2021, 8:44 AM ?

## 2021-11-02 NOTE — H&P (Signed)
?Family Medicine Teaching Service ?Hospital Admission History and Physical ?Service Pager: [phone] ? ?Patient name: [name] Medical record number: [id number] ?Date of birth: [birth year] Age: 46 y.o. Gender: female ? ?Primary Care Provider: Gifford Shave, MD ?Consultants: Cardiology ?Code Status: Full ?Preferred Emergency Contact: daughter, [name] ? ?Chief Complaint: chest pain, dyspnea ? ?Assessment and Plan: ?[name] is a 46 y.o. female presenting with chest pain and dyspnea likely due to acute HFrEF exacerbation. PMH is significant for HFrEF, paroxysmal A-fib, MDD, hypertension, type 2 diabetes mellitus, OSA, hyperlipidemia, anxiety, obesity, PCOS. ? ?Dyspnea secondary to Acute on Chronic exacerbation of HFrEF ?Most likely cause for presentation is suboptimal medication adherence due to confusion over diuretic dose. VSS, patient able to speak in full sentences, no SpO2 desaturation. Also considered infectious and embolic etiologies, both less likely given history, physical, labs, and medications, see HPI.  At last admission, due to low EF, it was recommended by cardiology that patient be referred to EP for ICD evaluation.  This has not occurred in the outpatient setting yet. ?- Admit to FPTS, attending Dr. McDiarmid ?- Cardiac monitoring ?- Cardiology consulted, HF team, appreciate recommendations ?- Echo ?- AM CBC, CMP ?- Daily weight, ?- Strict I/O's ?-Lasix 60 mg IV twice daily ?- Continue GDMT: Coreg 6.25 mg twice daily, digoxin 0.125 mg daily, Jardiance 25 mg daily, Entresto 97-103 twice daily, spironolactone 25 mg daily ?- Diet heart healthy/carb modified ?- Xarelto full anticoagulation for DVT PPx, see below ?- Vital signs every shift ?- Continuous pulse oximetry, goal to keep SPO2 greater than 92% ?- PT/OT eval and treat ? ?Chest wall pain: chronic, likely musculoskeletal ?Chronic problem that has been steadily worsening since discharge 2 weeks ago.  Troponins have been flat 9, 10.  EKG with mild sinus tachycardia, now resolved, no signs of ischemia.  Chest pain is atypical, patient has had extensive cardiac work-up done recently, see HPI.  She has mild nonobstructive CAD, nonischemic cardiomyopathy.  Patient reports that she has tried topical gels like IcyHot or Biofreeze without relief, Tylenol without relief, heating pad without relief.'s admission and cardiology had use low-dose morphine with good symptomatic relief.  As patient is very uncomfortable, will trial this for tonight.  Declines Tylenol at this moment.  In the setting and in combination of atypical chest wall pain, flat troponins, reassuring EKG, will treat pain as musculoligamentous. ?- Ice pack or K pad as desired ?- Tylenol 650 mg q6h prn ?- Morphine 2 mg IV once ? ?Decreased Urine Output ?Monitor for urine output, given patient report of decreased urine. Bladder scans and I/O cath ordered as necessary, discussed with nursing to follow closely.  ?-UA ordered ?- Bladder scans, I/O caths ordered as necessary, will follow ? ?MDD: Chronic ?Please see HPI, chronic, stable (not improved nor worsened).  Patient has passive SI, which has been present for several weeks, however she does not express a plan or intent to commit suicide.  Home medication includes sertraline 50 mg, hydroxyzine 10 mg 3 times daily, trazodone 50 mg nightly as needed.  Sertraline has only been added for 2 weeks, needs more time for full trial.  Patient is adamant that she does not want further services for support, despite extensive counseling on nature of MDD and its effect on healing from organic disease. ?- Continue home medications sertraline, hydroxyzine, trazodone ?- Continue to monitor mood and offer support ? ?Paroxysmal A fib: Chronic, controlled ?Chronic, heart rate currently rate controlled.  Patient reports adherence with Xarelto  20 mg.  Will check INR, if elevated could lend credence to adherence report.  Patient had previously been on warfarin,  however that was adherent with this medication.  She had history of LV thrombus, and was consequently changed to Xarelto 20 mg at last admission. ?- INR ?- Cardiac monitoring as above ? ?Type 2 diabetes: Chronic, uncontrolled ?Last A1c was obtained on August 31, 2021 was 13.2%.  Home medications are Lantus 36 units daily, NovoLog 12 units before supper, Jardiance 25 mg daily. ?- CBGs before every meal, nightly ?- Semglee 20 units daily ?- Moderate SSI ?- Continue Jardiance as above for GDMT ? ?Hypertension: Chronic, controlled ?Blood pressures in hospital have been stable to mildly elevated most recently 154/105.  See above for current medications. ?- Continue to monitor ?- Continue GDMT as above ? ?Hyperlipidemia: Chronic, stable ?Last lipid panel obtained February 2023 showed significantly elevated cholesterol to 222 and LDL to 160, HDL 43.  Patient is on atorvastatin 40 mg daily. ?- Continue statin ? ?Iron deficiency anemia: Chronic, stable ?Patient has mild anemia to 11.1, 11 is her baseline.  MCV low to 77.6.  Patient's last menstrual period was this week. ?-AM CBC ? ?FEN/GI: heart healthy/carb modified ?Prophylaxis: xarelto full dose anticoagulation ? ?Disposition: admit for observation to cardiac telemetry pending medical work up ? ?History of Present Illness:  [name] is a 46 y.o. female presenting with 3 days of worsening dyspnea and chest pain.  Patient is well-known to [facility] service, she has been admitted once in February and March for similar presentations.  Last echo 08/2021 with EF 20%, G3 DD (restrictive), RV moderately reduced, mild MR/TR.  R/LHC with mild CAD, nonobstructive she reports that she has been adherent with her heart failure medications to the best of her ability, and yet she has continued to get worse.  She has continued to gain weight on her home scale, however could not remember her weight today.  She reports that she has been taking 40 mg torsemide twice a day for diuresis,  however instructions from last discharge 2 weeks ago was for 60 mg torsemide twice daily. ? ?Patient has noticed that her dyspnea is worse, both with exertion and at rest.  She is satting well on room air, can speak in full sentences.  However she also has noted that her legs have been a little more swollen.  She has noticed at home when she is trying to do simple tasks like pick up items around her room she has to stop and rest and catch her breath. ? ?She has a long history of chest wall pain that is atypical.  Pain is located on the anterior left chest between ribs 2 and 5 and is diffusely mildly tender to palpation.  This is present constantly, is not worse with exertion.  She reports that this pain is dull and aching in nature, however has gotten more intense and pain has overall increased in the last 3 days.  Due to her pain, patient cannot lay down, or sit upright comfortably she is sitting on her knees slightly flexed holding onto the top of the bed due to pain.  Troponins were flat at 9, 10.  Patient has had extensive cardiac work-up in the past.  She had a R/LHC on 09/20/21 with mild nonobstructive CAD, elevated right and left filling pressures, pulmonary venous hypertension, preserved CO, PAPi 1.5 done on September 20, 2021. She is on GDMT, she follows with HF clinic, however she missed  her last appt on 10/08/21, next appt scheduled for 11/22/21.  Chest x-ray shows cardiomegaly, mild pulmonary vascular congestion, however improved in comparison to chest x-ray from 10/18/2021. ? ?Patient reports that she has been adherent with her xarelto 20 mg daily, which she is on for paroxysmal a fib.  Patient is not in A-fib today as pulse is regular on exam and EKG with very mild sinus tachycardia (now resolved). Will check INR to assess level for adherence.  Denies any recent travel, no unilateral leg pain or swelling, no history of malignancy, no surgery or recent immobility. Geneva and Well's score for PE are both 0,  will not obtain d-dimer at this time.  ? ?Patient reports that she has had a difficult time urinating.  She has not urinated in about 8 hours.  She denies any dysuria, frequency, urgency, denies any suprapu

## 2021-11-02 NOTE — Progress Notes (Addendum)
Heart Failure Navigator Progress Note ? ?Assessed for Heart & Vascular TOC clinic readiness.  ?Patient does not meet criteria due to prior to hospitalization pt was established with AHF clinic under Dr. Aundra Dubin. Last seen by J.Milford, NP 3/31 in AHF clinic per note: "she does not wish to schedule any further visits with the AHF team. Dr. Aundra Dubin aware, she can follow with general cardiology if she wishes."  ? ?Pending repeat ECHO.  ?08/2021: EF 20%, G3DD, severely reduced RV function. ? ? ?Pricilla Holm, MSN, RN ?Heart Failure Nurse Navigator ? ?HF Navigation team available for educational resources.  ? ? ?

## 2021-11-02 NOTE — Progress Notes (Addendum)
Family Medicine Teaching Service ?Daily Progress Note ?Intern Pager: [phone] ? ?Patient name: [name] Medical record number: [id number] ?Date of birth: 1976-04-16 Age: 46 y.o. Gender: female ? ?Primary Care Provider: Gifford Shave, MD ?Consultants: Cardiology ?Code Status: Full ? ?Pt Overview and Major Events to Date:  ?4/11 admit ? ?Assessment and Plan: ?[name] is a 46 yo female presenting with chest pain and dyspnea secondary to acute HFrEF exacerbation. PMHx significant for HFrEF, paroxysmal A-fib, MDD, HTN, T2DM, OSA, HLD, Anxiety, obesity, PCOS ? ?Acute on Chronic exacerbation of HFrEF ?Patient supposed to be on demadex 60 mg bid. VSS on room air. Similar presentation to hospitalization 2 weeks ago. HF to follow. CBC/CMP stable to last hospitalization. BNP 181.7. Digoxin <0.2.  Chest x-ray this admission costophrenic angle more visible blood pulmonary vasculature appears more distended than last admission chest x-ray.  Patient is adamant she is taking all medications as prescribed. Current weight 83.3 kg, Dry Weight ~80 kg. Had 2 doses of IV lasix 60 mg at 5AM and 8AM.  ?-Strict I/O ?-HF team/Cardiology consulted, appreciate recommendations ?-Echo today ?-Hold IV Lasix 60 mg ?-Afternoon BMP to assess electrolytes from 120 mg IV lasix from morning ?-Continue Coreg 6.25 mg BID with meals ?-Continue Entresto 97-103 BID ?-Continue Aldactone 25 mg daily ?-Continue jardiance 25 mg qd ?-Continue Digoxin 0.125 mg qd ?-PT/OT eval and treat ? ?Dizziness ?Reports dizziness when ambulating to bathroom. Likely component of orthostatic hypotension and poor PO intake secondary to nausea. ?-PT/OT eval and treat ?-Orthostatic vitals ? ?Chronic Nausea ?GERD ?Burping in room. Chest pain and abdominal discomfort may be secondary to GERD. ?-Protonix 40 qd ? ?Chest pain ?Appears chronic in nature. May be Chest Wall Syndrome. ?-Tylenol 650 mg q6 hour ?-Voltaren gel ?-Rib belt ? ?Suprapubic pain ?PCOS ?Decreased  Urine Output ?Last bladder scan 81. Does not appear to be retaining. Reports decreased urinary frequency and output ?-CT abdomen/pelvis with oral contrast ?-Continue bladder scans q 8 hours ? ?MDD: Chronic ?Chronic passive SI. Frustrated about repeat hospitalization. ?-Continue home zoloft, hydroxyzine, trazodone ?-Continue to offer therapy options ? ?Paroxysmal A fib: Chronic, controlled ?Heart rate relatively stable ?-Cardiac monitoring ?-Continue xarelto ? ?Type 2 diabetes: Chronic, uncontrolled ?Last A1c 14% in February. Home med: Lantus 36 u qd, novolog 12 u before supper, jaridance 25 mg qd ?-Semglee 20 u daily ?-Moderate SSI ?-Continue jardiance ?-A1c pending ? ?Hypertension: Chronic, controlled ?SBP 140s-150s. ?-Continue anti-hypertensives as above ? ?Hyperlipidemia: Chronic, stable ?-Continue home atorvastatin 40 mg daily ? ?Microcytic anemia: Chronic, stable ?Iron deficiency anemia ?Hgb 10.9. Baseline ~11. Component of poor PO intake  ?-AM CBC ? ?OSA ?Intolerant of CPAP ?-Lifestyle modification ? ?FEN/GI: heart healthy/carb modified ?PPx: xarelto ?Dispo:Pending PT recommendations  in 2-3 days. Barriers include ongoing cardiac work up.  ? ?Subjective:  ?Patient seen and assessed at bedside.  Patient reports the main difference between this hospitalization.  Once his new-onset dizziness.  Patient reported she noted this when she went to the bathroom. ? ?Objective: ?Temp:  [98.3 ?F (36.8 ?C)] 98.3 ?F (36.8 ?C) (04/10 1942) ?Pulse Rate:  [76-111] 78 (04/11 0615) ?Resp:  [7-26] 21 (04/11 0615) ?BP: (141-157)/(96-109) 146/104 (04/11 0615) ?SpO2:  [97 %-100 %] 100 % (04/11 0615) ?Physical Exam: ?General: Resting comfortably, NAD ?Cardiovascular: RRR, 1+ pitting edema ?Respiratory: CTAB ?Abdomen: soft, tenderness in suprapubic region ?Extremities: warm, dry ? ?Laboratory: ?Recent Labs  ?Lab 11/01/21 ?2314 11/02/21 ?6301  ?WBC 9.3 10.0  ?HGB 11.1* 10.9*  ?HCT 35.0* 35.8*  ?PLT 355 351  ? ?  Recent Labs  ?Lab  11/01/21 ?2056 11/02/21 ?2446  ?NA 136 137  ?K 3.6 3.8  ?CL 108 108  ?CO2 21* 22  ?BUN 9 10  ?CREATININE 0.70 0.82  ?CALCIUM 8.9 8.9  ?PROT 7.3 7.0  ?BILITOT 0.8 0.7  ?ALKPHOS 92 85  ?ALT 6 7  ?AST 11* 12*  ?GLUCOSE 166* 245*  ? ? ? ? ?Imaging/Diagnostic Tests: ?DG Chest Portable 1 View ? ?Result Date: 11/01/2021 ?CLINICAL DATA:  Shortness of breath on exertion, history of CHF. EXAM: PORTABLE CHEST 1 VIEW COMPARISON:  10/17/2021. FINDINGS: The heart is enlarged and there is mild distention of the pulmonary vasculature. Stable atelectasis or scarring is noted in the mid left lung. No consolidation, effusion, or pneumothorax. No acute osseous abnormality. IMPRESSION: Cardiomegaly with mildly distended pulmonary vasculature. Electronically Signed   By: Brett Fairy M.D.   On: 11/01/2021 23:32   ? ?France Ravens, MD ?11/02/2021, 7:13 AM ?PGY-1, [place] ?[place] Intern pager: [phone], text pages welcome ?

## 2021-11-02 NOTE — Consult Note (Addendum)
?Cardiology Consultation:  ? ?Patient ID: [name] ?MRN: [id number]; DOB: 1976/01/07 ? ?Admit date: 11/01/2021 ?Date of Consult: 11/02/2021 ? ?PCP:  Gifford Shave, MD ?  ?[facility] Providers ?Cardiologist:  Loralie Champagne, MD  Dr. Johnsie Cancel ?Electrophysiologist:  Will Meredith Leeds, MD  { ? ? ?Patient Profile:  ? ?[name] is a 46 y.o. female with a hx of systolic and diastolic heart failure, NICM, nonobstructive CAD, history of LV thrombus, paroxysmal atrial fibrillation, type 2 diabetes, hypertension, hyperlipidemia, obesity, OSA, medical noncompliance, PCOS, who is being seen 11/02/2021 for the evaluation of decompensated heart failure at the request of Dr.  Owens Shark.  ? ?History of Present Illness:  ? ?[name] suffers chronic systolic and diastolic heart failure, with recurrent hospitalization for decompensated heart failure over the years, had recurrent medication noncompliance issue, was discharged by advanced heart failure clinic due to noncompliance and combative behavior in the past.  ? ?She was initially diagnosed with nonischemic cardiomyopathy 6/20, with Echo 12/2018 showing EF 20 to 25%. Echo from 9/20 with EF 28-78%, LV diastolic parameters are consistent with restrictive filling, LV diffuse hypokinesis, apical thrombus, moderate RV systolic dysfunction, mild MR, moderate TR. Echo repeat on 3/21 with EF less than 20%, severe RV dysfunction.  She had R/L [facility] 9/20 which revealed nonobstructive CAD. R>L heart failure with low PAPI suggesting significant RV dysfunction.  Primarily pulmonary venous hypertension with low cardiac output CI 2.07.  Echo from 6/21 with EF improved to 35%.  Echo repeated on 11/21 with EF improved to 35 to 40% with grade 1 DD.  Etiology for her cardiomyopathy was thought due to poorly controlled diabetes and hypertension, although myocarditis was considered as well.  She has history of paroxysmal atrial fibrillation, mostly in rhythm and CM was felt not due to  tachycardia induced.  She was on Coumadin for LV thrombus previously and later switched to Xarelto for atrial fibrillation anticoagulation, LV thrombus had resolved on MRI from 08/2021. ? ?She was last admitted 09/2021 for CHF decompensation due to medical noncompliance again and was seen by Dr Aundra Dubin. Most recent echo from 09/18/2021 revealed EF 20% with grade 3 DD (restrictive), RV severely reduced.  RVSP 45.7 mmHg. Mild MR and TR. R/LHC on 09/20/2021 showed mild, nonobstructive CAD, elevated right and left heart filling pressures, preserved cardiac output, low PAPI at 1.5, and pulmonary venous hypertension. CI Fick 2.72. cMRI from 09/20/21 with LVEF 23%, diffuse hypokinesis with inferior akinesis, normal RV size with moderate systolic dysfunction with RVEF 32%, no LGE. She was not taking diuretic as directed and also missed heart failure clinic follow-up on 10/08/2021.  She was resumed on her cardiac medication including Entresto, spironolactone, Coreg, digoxin, empagliflozin, and torsemide during that hospitalization.  She was also advised to avoid pregnancy while on fetal toxic heart failure medication.  ? ?Patient presented to the ER on 11/01/2021 complaining chest pain and shortness of breath at rest and with exertion over the past 3 days.  She has reported weight gain as well as worsening leg swelling.  She was taking torsemide 40 mg twice daily, although was instructed to take  it 60 mg twice daily 2 weeks ago upon discharge.  She also complained anterior left chest wall pain and tenderness on palpation, that is limiting her ability to lay down or sitting upright.  Admission diagnostic and notable for BNP 181, digoxin level <0.2.  Hemoglobin A1c 9%.  hCG negative.  Anemia with hemoglobin 11.1. Chest x-ray revealed cardiomegaly with mildly distended  pulmonary vasculature.  High Sensitive troponin negative x2 EKG showed sinus tachycardia with ventricular 102bpm, no acute ischemic changes.  Echocardiogram is pending.   She was subsequently admitted to family medicine, for decompensated heart failure, started on IV Lasix 60 mg twice a day, and continued on GDMT. Chest wall pain was felt due to musculoskeletal strain.  ? ?Upon encounter, patient reports she had worsening SOB and leg swelling for 3 days. She said she took her torsemide '40mg'$  BID, did not think she should go up to '60mg'$  BID because she was getting her menstrual period and weight usually goes up a little around that time. She states she is taking all other cardiac meds for her CHF. She states she should weigh around 183 pounds. She states she is unaware of being discharged by advanced heart failure clinic. She states she gained weight now but unaware of her weight prior this admission. She confirms left sided chest wall pain and tenderness, not related to activities. She felt she is urinating less on torsemide '40mg'$  BID, but also does not want to go up on dosing as she has to void frequently.  ? ? ?Past Medical History:  ?Diagnosis Date  ? Abnormal Pap smear   ? Acute on chronic combined systolic and diastolic CHF (congestive heart failure) ([place]) 02/19/2019  ? Acute on chronic congestive heart failure ([place])   ? Acute on chronic congestive heart failure ([place])   ? Acute on chronic diastolic CHF (congestive heart failure) ([place]) 10/19/2021  ? Anemia   ? Asthma   ? Atrial fibrillation ([place])   ? history of paroxysmal atrial fibrillation  ? CHF exacerbation ([place]) 10/17/2021  ? Diabetes mellitus   ? DKA, type 2 ([place]) 02/06/2019  ? Elevated troponin 09/30/2019  ? Heart failure with reduced ejection fraction ([place])   ? History of Left ventricular thrombus 09/22/2021  ? Hydrosalpinx 08/21/2009  ? Dx on Pelvic US in Jan 2011.  Consider chronic etiology given continued fertility issues  ? Hypertension   ? Hypertensive emergency 09/30/2019  ? Major depressive disorder 02/08/2012  ? Reports that 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     ? Migraines   ? Morbid obesity ([place]) 02/06/2019  ? Nonischemic cardiomyopathy ([place]) 09/22/2021  ? Obstructive sleep apnea 06/30/2015  ? Polycystic ovarian disease   ? ? ?Past Surgical History:  ?Procedure Laterality Date  ? CERVIX LESION DESTRUCTION    ? CESAREAN SECTION    ? RIGHT/LEFT HEART CATH AND CORONARY ANGIOGRAPHY N/A 03/29/2019  ? Procedure: RIGHT/LEFT HEART CATH AND CORONARY ANGIOGRAPHY;  Surgeon: Larey Dresser, MD;  Location: [facility];  Service: Cardiovascular;  Laterality: N/A;  ? RIGHT/LEFT HEART CATH AND CORONARY ANGIOGRAPHY N/A 09/20/2021  ? Procedure: RIGHT/LEFT HEART CATH AND CORONARY ANGIOGRAPHY;  Surgeon: Larey Dresser, MD;  Location: [facility];  Service: Cardiovascular;  Laterality: N/A;  ?  ? ?Home Medications:  ?Prior to Admission medications   ?Medication Sig Start Date End Date Taking? Authorizing Provider  ?atorvastatin (LIPITOR) 40 MG tablet TAKE 1 TABLET BY MOUTH DAILY AT 6 PM. 04/01/20 08/27/22 Yes Larey Dresser, MD  ?carvedilol (COREG) 6.25 MG tablet Take 1 tablet (6.25 mg total) by mouth 2 (two) times daily with a meal. 09/22/21  Yes Gladys Damme, MD  ?hydrOXYzine (ATARAX) 10 MG tablet Take 1 tablet (10 mg total) by mouth 3 (three) times daily as needed. ?Patient taking differently: Take  10 mg by mouth 3 (three) times daily as needed for anxiety. 10/22/21  Yes Gladys Damme, MD  ?insulin aspart (NOVOLOG FLEXPEN) 100 UNIT/ML FlexPen Inject 12 Units into the skin daily before supper. 09/02/21  Yes Hensel, Jamal Collin, MD  ?insulin glargine (LANTUS) 100 UNIT/ML Solostar Pen Inject 36 Units into the skin every morning. 09/02/21  Yes Hensel, Jamal Collin, MD  ?rivaroxaban (XARELTO) 20 MG TABS tablet Take 1 tablet (20 mg total) by mouth daily with supper. 10/19/21  Yes Wells Guiles, DO  ?sacubitril-valsartan (ENTRESTO) 97-103 MG Take 1 tablet by mouth 2 (two) times daily. 10/19/21  Yes Wells Guiles, DO  ?sertraline (ZOLOFT) 50 MG tablet Take 1 tablet (50 mg total) by mouth  daily. 10/20/21  Yes Wells Guiles, DO  ?spironolactone (ALDACTONE) 25 MG tablet Take 1 tablet (25 mg total) by mouth daily. 10/20/21  Yes Wells Guiles, DO  ?torsemide (DEMADEX) 20 MG tablet Take 3 tablets (6

## 2021-11-02 NOTE — Evaluation (Signed)
Physical Therapy Evaluation ?Patient Details ?Name: [name] ?MRN: [id number] ?DOB: [birth year] ?Today's Date: 11/02/2021 ? ?History of Present Illness ? Pt is a 46 y/o female admitted secondary to dizziness and CHF exacerbation. PMH includes DM, HTN, MDD, a fib.  ?Clinical Impression ? Pt admitted secondary to problem above with deficits below. Pt requiring min guard A for mobility tasks in room. Reports dizziness/fatigue with position changes. Would benefit from formal vestibular assessment. Reports family can assist if needed. Will continue to follow acutely.    ?   ? ?Recommendations for follow up therapy are one component of a multi-disciplinary discharge planning process, led by the attending physician.  Recommendations may be updated based on patient status, additional functional criteria and insurance authorization. ? ?Follow Up Recommendations Other (comment) (may benefit from outpatient pending formal vestibular assessment) ? ?  ?Assistance Recommended at Discharge Intermittent Supervision/Assistance  ?Patient can return home with the following ? Assistance with cooking/housework ? ?  ?Equipment Recommendations Other (comment) (shower chair)  ?Recommendations for Other Services ?    ?  ?Functional Status Assessment Patient has had a recent decline in their functional status and demonstrates the ability to make significant improvements in function in a reasonable and predictable amount of time.  ? ?  ?Precautions / Restrictions Precautions ?Precautions: Fall ?Restrictions ?Weight Bearing Restrictions: No  ? ?  ? ?Mobility ? Bed Mobility ?Overal bed mobility: Modified Independent ?  ?  ?  ?  ?  ?  ?General bed mobility comments: increased time ?  ? ?Transfers ?Overall transfer level: Needs assistance ?Equipment used: 1 person hand held assist ?Transfers: Sit to/from Stand ?Sit to Stand: Min guard ?  ?  ?  ?  ?  ?General transfer comment: Min guard for safety to stand from stretcher. ?   ? ?Ambulation/Gait ?Ambulation/Gait assistance: Min guard ?Gait Distance (Feet): 50 Feet ?Assistive device: 1 person hand held assist ?Gait Pattern/deviations: Step-through pattern, Decreased stride length ?Gait velocity: Decreased ?  ?  ?General Gait Details: Reporting some dizziness and fatigue with ambulation. Min guard for safety and pt reaching out to PT/OT hand for support ? ?Stairs ?  ?  ?  ?  ?  ? ?Wheelchair Mobility ?  ? ?Modified Rankin (Stroke Patients Only) ?  ? ?  ? ?Balance Overall balance assessment: Mild deficits observed, not formally tested ?  ?  ?  ?  ?  ?  ?  ?  ?  ?  ?  ?  ?  ?  ?  ?  ?  ?  ?   ? ? ? ?Pertinent Vitals/Pain Pain Assessment ?Pain Assessment: Faces ?Faces Pain Scale: Hurts even more ?Pain Location: chest ?Pain Descriptors / Indicators: Discomfort ?Pain Intervention(s): Limited activity within patient's tolerance, Monitored during session, Repositioned  ? ? ?Home Living Family/patient expects to be discharged to:: Private residence ?Living Arrangements: Alone ?Available Help at Discharge: Family;Available PRN/intermittently (daughter) ?Type of Home: House ?Home Access: Stairs to enter ?  ?Entrance Stairs-Number of Steps: 1 ?Alternate Level Stairs-Number of Steps: flight ?Home Layout: Two level;1/2 bath on main level ?Home Equipment: None ?Additional Comments: pt has air mattress down stairs and has been staying on it with dyspnea  ?  ?Prior Function Prior Level of Function : Working/employed;Driving;Independent/Modified Independent ?  ?  ?  ?  ?  ?  ?Mobility Comments: no AD ?  ?  ? ? ?Hand Dominance  ?   ? ?  ?Extremity/Trunk Assessment  ? Upper Extremity Assessment ?  Upper Extremity Assessment: Defer to OT evaluation ?  ? ?Lower Extremity Assessment ?Lower Extremity Assessment: Generalized weakness ?  ? ?Cervical / Trunk Assessment ?Cervical / Trunk Assessment: Normal  ?Communication  ? Communication: Other (comment) (soft spoken)  ?Cognition Arousal/Alertness:  Awake/alert ?Behavior During Therapy: [facility] for tasks assessed/performed ?Overall Cognitive Status: No family/caregiver present to determine baseline cognitive functioning ?  ?  ?  ?  ?  ?  ?  ?  ?  ?  ?  ?  ?  ?  ?  ?  ?  ?  ?  ? ?  ?General Comments General comments (skin integrity, edema, etc.): Did not note obvious nystagmus with position changes, however, would benefit from more formal vestibular assessment. ? ?  ?Exercises    ? ?Assessment/Plan  ?  ?PT Assessment Patient needs continued PT services  ?PT Problem List Decreased strength;Decreased balance;Decreased mobility;Decreased activity tolerance ? ?   ?  ?PT Treatment Interventions DME instruction;Gait training;Stair training;Therapeutic activities;Therapeutic exercise;Functional mobility training;Balance training;Patient/family education   ? ?PT Goals (Current goals can be found in the Care Plan section)  ?Acute Rehab PT Goals ?Patient Stated Goal: to feel better ?PT Goal Formulation: With patient ?Time For Goal Achievement: 11/16/21 ?Potential to Achieve Goals: Good ? ?  ?Frequency Min 3X/week ?  ? ? ?Co-evaluation PT/OT/SLP Co-Evaluation/Treatment: Yes ?Reason for Co-Treatment: For patient/therapist safety;To address functional/ADL transfers ?PT goals addressed during session: Mobility/safety with mobility;Balance ?  ?  ? ? ?  ?AM-PAC PT "6 Clicks" Mobility  ?Outcome Measure Help needed turning from your back to your side while in a flat bed without using bedrails?: None ?Help needed moving from lying on your back to sitting on the side of a flat bed without using bedrails?: None ?Help needed moving to and from a bed to a chair (including a wheelchair)?: A Little ?Help needed standing up from a chair using your arms (e.g., wheelchair or bedside chair)?: A Little ?Help needed to walk in hospital room?: A Little ?Help needed climbing 3-5 steps with a railing? : A Little ?6 Click Score: 20 ? ?  ?End of Session   ?Activity Tolerance: Patient limited by  fatigue ?Patient left: in bed;with call bell/phone within reach (on stretcher in ED) ?Nurse Communication: Mobility status ?PT Visit Diagnosis: Unsteadiness on feet (R26.81);Dizziness and giddiness (R42) ?  ? ?Time: 3474-2595 ?PT Time Calculation (min) (ACUTE ONLY): 32 min ? ? ?Charges:   PT Evaluation ?$PT Eval Moderate Complexity: 1 Mod ?  ?  ?   ? ? ?Reuel Derby, PT, DPT  ?Acute Rehabilitation Services  ?Pager: [phone] ?Office: [phone] ? ? ?[place] ?11/02/2021, 12:59 PM ? ?

## 2021-11-02 NOTE — Evaluation (Signed)
Occupational Therapy Evaluation ?Patient Details ?Name: [name] ?MRN: [id number] ?DOB: 12-Oct-1975 ?Today's Date: 11/02/2021 ? ? ?History of Present Illness Pt is a 46 y/o female admitted secondary to dizziness and CHF exacerbation. PMH includes DM, HTN, MDD, a fib.  ? ?Clinical Impression ?  ?PTA pt lives alone however states she has family who can assist after DC if needed. Overall minguard to set up with ADL tasks. Will follow up acutely to facilitate safe DC home. VSS ?   ? ?Recommendations for follow up therapy are one component of a multi-disciplinary discharge planning process, led by the attending physician.  Recommendations may be updated based on patient status, additional functional criteria and insurance authorization.  ? ?Follow Up Recommendations ? No OT follow up  ?  ?Assistance Recommended at Discharge Set up Supervision/Assistance  ?Patient can return home with the following A little help with bathing/dressing/bathroom;Assistance with cooking/housework;Assist for transportation;Help with stairs or ramp for entrance ? ?  ?Functional Status Assessment ? Patient has had a recent decline in their functional status and demonstrates the ability to make significant improvements in function in a reasonable and predictable amount of time.  ?Equipment Recommendations ? None recommended by OT  ?  ?Recommendations for Other Services   ? ? ?  ?Precautions / Restrictions Precautions ?Precautions: Fall ?Restrictions ?Weight Bearing Restrictions: No  ? ?  ? ?Mobility Bed Mobility ?Overal bed mobility: Modified Independent ?  ?  ?  ?  ?  ?  ?General bed mobility comments: increased time ?  ? ?Transfers ?Overall transfer level: Needs assistance ?Equipment used: 1 person hand held assist ?Transfers: Sit to/from Stand ?Sit to Stand: Min guard ?  ?  ?  ?  ?  ?General transfer comment: Min guard for safety to stand from stretcher. ?  ? ?  ?Balance Overall balance assessment: Mild deficits observed, not formally  tested ?  ?  ?  ?  ?  ?  ?  ?  ?  ?  ?  ?  ?  ?  ?  ?  ?  ?  ?   ? ?ADL either performed or assessed with clinical judgement  ? ?ADL   ?  ?  ?  ?  ?  ?  ?  ?  ?  ?  ?  ?  ?  ?  ?  ?  ?  ?  ?Functional mobility during ADLs: Minimal assistance ?General ADL Comments: set up for UB/ minguard for LB ADL; ableto ambulate to toilet and complete pericare with minguard assist  ? ? ? ?Vision   ?   ?   ?Perception   ?  ?Praxis   ?  ? ?Pertinent Vitals/Pain Pain Assessment ?Pain Assessment: 0-10 ?Faces Pain Scale: Hurts even more ?Pain Location: chest ?Pain Descriptors / Indicators: Discomfort  ? ? ? ?Hand Dominance   ?  ?Extremity/Trunk Assessment Upper Extremity Assessment ?Upper Extremity Assessment: Overall WFL for tasks assessed ?  ?Lower Extremity Assessment ?Lower Extremity Assessment: Defer to PT evaluation ?  ?Cervical / Trunk Assessment ?Cervical / Trunk Assessment: Normal (increased body habitus) ?  ?Communication Communication ?Communication: Other (comment) (soft spoken) ?  ?Cognition Arousal/Alertness: Awake/alert ?Behavior During Therapy: [facility] for tasks assessed/performed ?Overall Cognitive Status: No family/caregiver present to determine baseline cognitive functioning ?  ?  ?  ?  ?  ?  ?  ?  ?  ?  ?  ?  ?  ?  ?  ?  ?General Comments: not  initially wanting to participate; appeared upset regarding her helth issues ?  ?  ?General Comments  complained of dizziness; BP WFL however mildly elevated; no nystagmus noted however movemetn appeared to worsen dizziness; will further assess ? ?  ?Exercises   ?  ?Shoulder Instructions    ? ? ?Home Living Family/patient expects to be discharged to:: Private residence ?Living Arrangements: Alone ?Available Help at Discharge: Family;Available PRN/intermittently (daughter) ?Type of Home: House ?Home Access: Stairs to enter ?Entrance Stairs-Number of Steps: 1 ?  ?Home Layout: Two level;1/2 bath on main level ?Alternate Level Stairs-Number of Steps: flight ?  ?Bathroom Shower/Tub:  Tub/shower unit ?  ?Bathroom Toilet: Standard ?Bathroom Accessibility: Yes ?  ?Home Equipment: None ?  ?Additional Comments: pt has air mattress down stairs and has been staying on it with dyspnea ?  ? ?  ?Prior Functioning/Environment Prior Level of Function : Working/employed;Driving;Independent/Modified Independent ?  ?  ?  ?  ?  ?  ?Mobility Comments: no AD ?  ?  ? ?  ?  ?OT Problem List: Decreased activity tolerance;Impaired balance (sitting and/or standing);Decreased safety awareness;Decreased knowledge of use of DME or AE;Obesity ?  ?   ?OT Treatment/Interventions: Self-care/ADL training;Therapeutic exercise;Energy conservation;DME and/or AE instruction;Therapeutic activities;Patient/family education;Balance training  ?  ?OT Goals(Current goals can be found in the care plan section) Acute Rehab OT Goals ?Patient Stated Goal: to feel better ?OT Goal Formulation: With patient ?Time For Goal Achievement: 11/16/21 ?Potential to Achieve Goals: Good  ?OT Frequency: Min 2X/week ?  ? ?Co-evaluation PT/OT/SLP Co-Evaluation/Treatment: Yes ?Reason for Co-Treatment: For patient/therapist safety ?PT goals addressed during session: Mobility/safety with mobility;Balance ?OT goals addressed during session: ADL's and self-care ?  ? ?  ?AM-PAC OT "6 Clicks" Daily Activity     ?Outcome Measure Help from another person eating meals?: None ?Help from another person taking care of personal grooming?: None ?Help from another person toileting, which includes using toliet, bedpan, or urinal?: A Little ?Help from another person bathing (including washing, rinsing, drying)?: A Little ?Help from another person to put on and taking off regular upper body clothing?: None ?Help from another person to put on and taking off regular lower body clothing?: A Little ?6 Click Score: 21 ?  ?End of Session Nurse Communication: Mobility status ? ?Activity Tolerance: Patient tolerated treatment well ?Patient left: in bed;with call bell/phone within  reach ? ?OT Visit Diagnosis: Unsteadiness on feet (R26.81);Muscle weakness (generalized) (M62.81);Dizziness and giddiness (R42)  ?              ?Time: 1610-9604 ?OT Time Calculation (min): 28 min ?Charges:  OT General Charges ?$OT Visit: 1 Visit ?OT Evaluation ?$OT Eval Moderate Complexity: 1 Mod ? ?[facility], OT/L  ? ?Acute OT Clinical Specialist ?Acute Rehabilitation Services ?Pager [phone] ?Office [phone]  ? ?Kassius Battiste,HILLARY ?11/02/2021, 3:01 PM ?

## 2021-11-03 ENCOUNTER — Inpatient Hospital Stay (HOSPITAL_COMMUNITY): Payer: Medicaid Other

## 2021-11-03 DIAGNOSIS — F339 Major depressive disorder, recurrent, unspecified: Secondary | ICD-10-CM | POA: Diagnosis not present

## 2021-11-03 DIAGNOSIS — R110 Nausea: Secondary | ICD-10-CM | POA: Diagnosis not present

## 2021-11-03 DIAGNOSIS — I480 Paroxysmal atrial fibrillation: Secondary | ICD-10-CM | POA: Diagnosis not present

## 2021-11-03 DIAGNOSIS — I5041 Acute combined systolic (congestive) and diastolic (congestive) heart failure: Secondary | ICD-10-CM | POA: Diagnosis not present

## 2021-11-03 DIAGNOSIS — R112 Nausea with vomiting, unspecified: Secondary | ICD-10-CM | POA: Diagnosis not present

## 2021-11-03 DIAGNOSIS — N281 Cyst of kidney, acquired: Secondary | ICD-10-CM | POA: Diagnosis not present

## 2021-11-03 DIAGNOSIS — E1165 Type 2 diabetes mellitus with hyperglycemia: Secondary | ICD-10-CM | POA: Diagnosis not present

## 2021-11-03 LAB — CBC
HCT: 40 % (ref 36.0–46.0)
Hemoglobin: 13 g/dL (ref 12.0–15.0)
MCH: 24.7 pg — ABNORMAL LOW (ref 26.0–34.0)
MCHC: 32.5 g/dL (ref 30.0–36.0)
MCV: 76 fL — ABNORMAL LOW (ref 80.0–100.0)
Platelets: 428 10*3/uL — ABNORMAL HIGH (ref 150–400)
RBC: 5.26 MIL/uL — ABNORMAL HIGH (ref 3.87–5.11)
RDW: 17.8 % — ABNORMAL HIGH (ref 11.5–15.5)
WBC: 10.7 10*3/uL — ABNORMAL HIGH (ref 4.0–10.5)
nRBC: 0 % (ref 0.0–0.2)

## 2021-11-03 LAB — BASIC METABOLIC PANEL
Anion gap: 11 (ref 5–15)
BUN: 10 mg/dL (ref 6–20)
CO2: 26 mmol/L (ref 22–32)
Calcium: 9.5 mg/dL (ref 8.9–10.3)
Chloride: 99 mmol/L (ref 98–111)
Creatinine, Ser: 1.18 mg/dL — ABNORMAL HIGH (ref 0.44–1.00)
GFR, Estimated: 59 mL/min — ABNORMAL LOW (ref >60)
Glucose, Bld: 260 mg/dL — ABNORMAL HIGH (ref 70–99)
Potassium: 3.9 mmol/L (ref 3.5–5.1)
Sodium: 136 mmol/L (ref 135–145)

## 2021-11-03 LAB — GLUCOSE, CAPILLARY
Glucose-Capillary: 263 mg/dL — ABNORMAL HIGH (ref 70–99)
Glucose-Capillary: 175 mg/dL — ABNORMAL HIGH (ref 70–99)
Glucose-Capillary: 219 mg/dL — ABNORMAL HIGH (ref 70–99)
Glucose-Capillary: 124 mg/dL — ABNORMAL HIGH (ref 70–99)

## 2021-11-03 LAB — MAGNESIUM: Magnesium: 2.2 mg/dL (ref 1.7–2.4)

## 2021-11-03 MED ORDER — INSULIN ASPART 100 UNIT/ML IJ SOLN
3.0000 [IU] | Freq: Three times a day (TID) | INTRAMUSCULAR | Status: DC
  Administered 2021-11-03 – 2021-11-07 (×8): 3 [IU] via SUBCUTANEOUS

## 2021-11-03 MED ORDER — TORSEMIDE 20 MG PO TABS
60.0000 mg | ORAL_TABLET | Freq: Two times a day (BID) | ORAL | Status: DC
  Administered 2021-11-03: 60 mg via ORAL
  Filled 2021-11-03: qty 3

## 2021-11-03 MED ORDER — ONDANSETRON 4 MG PO TBDP: 4.0000 mg | ORAL_TABLET | Freq: Three times a day (TID) | ORAL | Status: DC

## 2021-11-03 MED ORDER — SACUBITRIL-VALSARTAN 97-103 MG PO TABS
1.0000 | ORAL_TABLET | Freq: Two times a day (BID) | ORAL | Status: DC
  Administered 2021-11-03 (×2): 1 via ORAL
  Filled 2021-11-03 (×3): qty 1

## 2021-11-03 MED ORDER — ONDANSETRON 4 MG PO TBDP
8.0000 mg | ORAL_TABLET | Freq: Three times a day (TID) | ORAL | Status: AC
  Administered 2021-11-03 – 2021-11-05 (×5): 8 mg via ORAL
  Filled 2021-11-03 (×7): qty 2

## 2021-11-03 MED ORDER — BACLOFEN 5 MG HALF TABLET
5.0000 mg | ORAL_TABLET | Freq: Three times a day (TID) | ORAL | Status: DC
  Administered 2021-11-03 – 2021-11-04 (×6): 5 mg via ORAL
  Filled 2021-11-03 (×6): qty 1

## 2021-11-03 MED ORDER — INSULIN GLARGINE-YFGN 100 UNIT/ML ~~LOC~~ SOLN
23.0000 [IU] | Freq: Every day | SUBCUTANEOUS | Status: DC
  Administered 2021-11-04 – 2021-11-07 (×4): 23 [IU] via SUBCUTANEOUS
  Filled 2021-11-03 (×4): qty 0.23

## 2021-11-03 NOTE — Progress Notes (Signed)
FPTS Interim Progress Note ? ?S:Went to bedside to check on patient, she was sleeping and I did not wake the patient.  ? ?O: ?BP 125/89 (BP Location: Right Arm)   Pulse 92   Temp 98 ?F (36.7 ?C) (Oral)   Resp 16   Ht 4' 11.02" (1.499 m)   Wt 80.8 kg   SpO2 100%   BMI 35.96 kg/m?   ?General: Patient sleeping comfortably, in no acute distress. ? ?A/P: ?Vitals, orders and telemetry reviewed. No changes to plan, continue plan per day team.  ? Donney Dice, DO ?11/03/2021, 1:09 AM ?PGY-2, [place] ?Service pager [phone]  ?

## 2021-11-03 NOTE — Consult Note (Addendum)
? ?  [facility] CM Inpatient Consult ? ? ?11/03/2021 ? ?[name] ?18-Nov-1975 ?[id number] ? ?Managed Medicaid:  [place] healthCare Medicare ? ?Primary Care Provider:  Gifford Shave, MD, [place], is an Embedded provider that does the [facility] calls and follow up. ? ?Patient screened for readmission less than 30 days hospitalization with noted Managed Medicaid noted for outreach for post hospital transition.  ?Met with patient at bedside explained out reach attempts and states she will answer.  Given a reminder appointment card and 24 hour nurse advise line.  ?Patient also shows active with [facility] team noted. ? ?Plan:  Continue to follow progress and disposition to assess for post hospital care management for MM team needs.   ? ?For questions contact:  ? ?Natividad Brood, RN BSN CCM ?[facility] Liaison ? [phone] business mobile phone ?Toll free office [phone]  ?Fax number: [phone] ?[email or web address]  ? ? ? ?

## 2021-11-03 NOTE — Progress Notes (Signed)
Physical Therapy Treatment ?Patient Details ?Name: [name] ?MRN: [id number] ?DOB: Jun 03, 1976 ?Today's Date: 11/03/2021 ? ? ?History of Present Illness Pt is a 46 y/o female admitted secondary to dizziness and CHF exacerbation. PMH includes DM, HTN, MDD, a fib. ? ?  ?PT Comments  ? ? Pt admitted with above diagnosis.  ?Pt was able to tolerate Epley Maneuver for right BPPV and reports decr symptoms after treatment. Pt left in bed positioned witih HOB above 20 degrees to let crystals settle. Refused to walk after the Epley.  Will continue to progress pt as able.  Pt currently with functional limitations due to balance and endurance deficits. Pt will benefit from skilled PT to increase their independence and safety with mobility to allow discharge to the venue listed below.      ?Recommendations for follow up therapy are one component of a multi-disciplinary discharge planning process, led by the attending physician.  Recommendations may be updated based on patient status, additional functional criteria and insurance authorization. ? ?Follow Up Recommendations ? Outpatient PT ?  ?  ?Assistance Recommended at Discharge Intermittent Supervision/Assistance  ?Patient can return home with the following Assistance with cooking/housework ?  ?Equipment Recommendations ? Other (comment) (shower chair)  ?  ?Recommendations for Other Services   ? ? ?  ?Precautions / Restrictions Precautions ?Precautions: Fall ?Restrictions ?Weight Bearing Restrictions: No  ?  ? ?Mobility ? Bed Mobility ?Overal bed mobility: Modified Independent ?  ?  ?  ?  ?  ?  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 Pt reported decr symptoms after treatment.  Did not want to walk therefore positioned her back in bed wiht HOB raised. ?  ? ?Transfers ?  ?  ?  ?  ?  ?  ?  ?  ?  ?  ?  ? ?Ambulation/Gait ?  ?  ?  ?  ?  ?  ?  ?  ? ? ?Stairs ?  ?  ?  ?  ?  ? ? ?Wheelchair Mobility ?  ? ?Modified Rankin  (Stroke Patients Only) ?  ? ? ?  ?Balance Overall balance assessment: Mild deficits observed, not formally tested ?  ?  ?  ?  ?  ?  ?  ?  ?  ?  ?  ?  ?  ?  ?  ?  ?  ?  ?  ? ?  ?Cognition Arousal/Alertness: Awake/alert ?Behavior During Therapy: [facility] for tasks assessed/performed ?Overall Cognitive Status: No family/caregiver present to determine baseline cognitive functioning ?  ?  ?  ?  ?  ?  ?  ?  ?  ?  ?  ?  ?  ?  ?  ?  ?  ?  ?  ? ?  ?Exercises   ? ?  ?General Comments General comments (skin integrity, edema, etc.): VSS ?  ?  ? ?Pertinent Vitals/Pain Pain Assessment ?Pain Assessment: No/denies pain  ? ? ?Home Living   ?  ?  ?  ?  ?  ?  ?  ?  ?  ?   ?  ?Prior Function    ?  ?  ?   ? ?PT Goals (current goals can now be found in the care plan section) Acute Rehab PT Goals ?Patient Stated Goal: to feel better ?Progress towards PT goals: Progressing toward goals ? ?  ?Frequency ? ? ? Min 3X/week ? ? ? ?  ?PT  Plan Current plan remains appropriate  ? ? ?Co-evaluation   ?  ?  ?  ?  ? ?  ?AM-PAC PT "6 Clicks" Mobility   ?Outcome Measure ? Help needed turning from your back to your side while in a flat bed without using bedrails?: None ?Help needed moving from lying on your back to sitting on the side of a flat bed without using bedrails?: None ?Help needed moving to and from a bed to a chair (including a wheelchair)?: A Little ?Help needed standing up from a chair using your arms (e.g., wheelchair or bedside chair)?: A Little ?Help needed to walk in hospital room?: A Little ?Help needed climbing 3-5 steps with a railing? : A Little ?6 Click Score: 20 ? ?  ?End of Session   ?Activity Tolerance: Patient limited by fatigue;Other (comment) (limited by dizziness) ?Patient left: in bed;with call bell/phone within reach ?Nurse Communication: Mobility status ?PT Visit Diagnosis: Unsteadiness on feet (R26.81);Dizziness and giddiness (R42) ?  ? ? ?Time: [phone] ?PT Time Calculation (min) (ACUTE ONLY): 16 min ? ?Charges:  $[place]  Rep Proc: 8-22 mins          ?          ? ?Andre Gallego M,PT ?Acute Rehab Services ?[phone] ?[phone] (pager)  ? ? ?Petra Sargeant F Female [place] ?11/03/2021, 10:54 AM ? ?

## 2021-11-03 NOTE — Progress Notes (Addendum)
?FMTS Attending Daily Note: ?Dorris Singh, MD  ?Team Pager [phone] ?Pager [phone]  ?I have seen and examined this patient, reviewed their chart. I have discussed this patient with the resident physician. ?My edits are within the note. ? ?The patient initially presented with dizziness, nausea, vomiting.  This is in part related to I think medications and some overdiuresis.  Holding diuretics today as below.  Her nausea and vomiting that have been ongoing and chronic.  Given persistent symptoms and her difficulty tolerating her medications at home we will ask gastroenterology to weigh in. ? ?Disposition: Ongoing inpatient evaluation. ? ? ?Family Medicine Teaching Service ?Daily Progress Note ?Intern Pager: [phone] ? ?Patient name: [name] Medical record number: [id number] ?Date of birth: [birth year] Age: 46 y.o. Gender: female ? ?Primary Care Provider: Gifford Shave, MD ?Consultants: Cardiology, GI ?Code Status: Full ? ?Pt Overview and Major Events to Date:  ?4/11 admit ? ?Assessment and Plan: ?[name] is a 46 yo female presenting with dizziness and chronic chest pain, thought to be due to chest wall syndrome. PMHx significant for HFrEF, paroxysmal A-fib, MDD, HTN, T2DM, OSA, HLD, Anxiety, obesity, PCOS ? ?Positional Dizziness ?Negative Orthostatics. Possibly BPPV. Improved after PT/OT today.  ?-PT eval and treat ?-Outpatient vestibular Assessment--referral as outpatient ? ?Heart failure with reduced ejection fraction, not in exacerbation but rather hypovolemic due to nausea/vomiting.  ?Echo EF 20-25% with global hypokinesis and grade 3 diastolic dysfunction, unchanged from last echo in February 2023. UOP 0.7 L, net out 0.4 L. Cr 0.88>1.18. Weight 79.7 kg today (dry weight ~80 kg). Was not taking jardiance or digoxin at home. ?-Strict I/O, daily weight ?-Cardiology Consulted, appreciate recs ?-Demadex 60 mg BID, will hold morning dose today due to hypotension ?-Continue home Coreg, Aldactone,  entresto ?-Discontinue Jardiance and Digoxin as patient is not taking ? ?AKI ?Cr 0.88>1.18. Will monitor. Likely 2/2 Lasix 120 mg yesterday morning and emesis. ?-Holding morning demadex ?-AM BMP ? ?Chronic Nausea and Vomiting, with headache, unclear cause, possibly gastritis vs. Gastroparesis. Could also be medication related. Hold digoxin. If headache worsens, consider intracranial process. ?-GI consult, greatly appreciate recommendations ?-Protonix 40 mg qd ? ?Chest pain ?Chest pressure 7/10. Goal 4/10. 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 Reproducible on exam, has had negative CTPA in past, on Xeralto, negative troponin on admission.  ?-Rib belt ?-Baclofen 5 mg tid ?-Avoid NSAIDs given patient is on Xarelto ? ?Suprapubic pain ?PCOS ?Decreased Urine Output ?CT abdomen/pelvis showed multiple cysts in kidneys. ?-Renal U/S ?-Continue monitoring UOP ?-Bladder Scans q8 ? ?MDD: Chronic ?Chronic passive SI. Frustrated about repeat hospitalization. ?-Continue home zoloft, hydroxyzine, trazodone ?-Continue to offer therapy options ? ?Paroxysmal A fib: Chronic, controlled ?Heart rate relatively stable ?-Cardiac monitoring ?-Continue xarelto ? ?Type 2 diabetes: Chronic, uncontrolled ?A1c 14.0 > 9.0 this admission.  ?-Increase Semglee 23 u daily ?-Moderate SSI with 3 u novolog with meals >50% eaten ?-Holding Jardiance ? ?Hyperlipidemia: Chronic, stable ?-Continue home atorvastatin 40 mg daily ? ?Microcytic anemia: Chronic, stable ?Iron deficiency anemia ?Hgb 13. Baseline ~11. Component of poor PO intake ?-AM CBC ? ?OSA ?Intolerant of CPAP ?-Lifestyle modification ? ?FEN/GI: heart healthy/carb modified ?PPx: xarelto ?Dispo:Home in 2-3 days. Barriers include ongoing medical management.  ? ?Subjective:  ?Patient seen and assessed at bedside.  Patient reports she suddenly vomited around 2 AM this morning. ? ?Objective: ?Temp:  [98 ?F (36.7 ?C)-98.2 ?F (36.8 ?C)] 98 ?F (36.7 ?C) (04/12 0326) ?Pulse Rate:  [77-101] 93 (04/12 0326) ?Resp:   [14-20] 14 (04/12 0326) ?  BP: (95-140)/(68-103) 95/68 (04/12 0326) ?SpO2:  [96 %-100 %] 99 % (04/12 0326) ?Weight:  [175 lb 11.3 oz (79.7 kg)-178 lb 2.1 oz (80.8 kg)] 175 lb 11.3 oz (79.7 kg) (04/12 0327) ?Physical Exam: ?General: Resting comfortably, NAD ?Cardiovascular: RRR ?Respiratory: CTA B ?Abdomen: Soft, nontender ?Extremities: Warm, dry ? ?Laboratory: ?Recent Labs  ?Lab 11/01/21 ?2314 11/02/21 ?1443 11/03/21 ?0422  ?WBC 9.3 10.0 10.7*  ?HGB 11.1* 10.9* 13.0  ?HCT 35.0* 35.8* 40.0  ?PLT 355 351 428*  ? ?Recent Labs  ?Lab 11/01/21 ?2056 11/02/21 ?1540 11/02/21 ?1442 11/03/21 ?0422  ?NA 136 137 135 136  ?K 3.6 3.8 3.6 3.9  ?CL 108 108 100 99  ?CO2 21* '22 27 26  '$ ?BUN '9 10 7 10  '$ ?CREATININE 0.70 0.82 0.88 1.18*  ?CALCIUM 8.9 8.9 9.4 9.5  ?PROT 7.3 7.0  --   --   ?BILITOT 0.8 0.7  --   --   ?ALKPHOS 92 85  --   --   ?ALT 6 7  --   --   ?AST 11* 12*  --   --   ?GLUCOSE 166* 245* 102* 260*  ? ? ? ? ?Imaging/Diagnostic Tests: ?CT ABDOMEN PELVIS WO CONTRAST ? ?Result Date: 11/02/2021 ?CLINICAL DATA:  Diffuse abdominal pain EXAM: CT ABDOMEN AND PELVIS WITHOUT CONTRAST TECHNIQUE: Multidetector CT imaging of the abdomen and pelvis was performed following the standard protocol without IV contrast. RADIATION DOSE REDUCTION: This exam was performed according to the departmental dose-optimization program which includes automated exposure control, adjustment of the mA and/or kV according to patient size and/or use of iterative reconstruction technique. COMPARISON:  None available FINDINGS: Lower chest: Minor subsegmental atelectasis at the left lung base. Mild cardiomegaly. Hepatobiliary: No focal liver abnormality is seen. No gallstones, gallbladder wall thickening, or biliary dilatation. Pancreas: Unremarkable. No pancreatic ductal dilatation or surrounding inflammatory changes. Spleen: Normal in size without focal abnormality. Adrenals/Urinary Tract: Adrenal glands appear normal. No nephrolithiasis or hydronephrosis  identified bilaterally. Two hypodense possible renal cortical cysts identified bilaterally measuring up to 1.2 cm in the anterior right kidney. Urinary bladder appears grossly normal, incompletely distended. Stomach/Bowel: No bowel obstruction, free air or pneumatosis. No bowel wall edema identified. Appendix is normal. Vascular/Lymphatic: Aortic atherosclerosis. No enlarged abdominal or pelvic lymph nodes. Reproductive: Uterus and bilateral adnexa are unremarkable. Other: No ascites. Musculoskeletal: No acute or significant osseous findings. IMPRESSION: 1. No acute process identified in the abdomen or pelvis. 2. Small hypodensities in the kidneys which likely represent cysts. Nonemergent follow-up renal ultrasound could be done if indicated. Electronically Signed   By: Ofilia Neas M.D.   On: 11/02/2021 16:38   ? ?France Ravens, MD ?11/03/2021, 6:17 AM ?PGY-1, [facility] ?[place] Intern pager: [phone], text pages welcome ?

## 2021-11-03 NOTE — Consult Note (Addendum)
? ?Referring Provider: Dr. Kayren Eaves ?Primary Care Physician:  Gifford Shave, MD ?Primary Gastroenterologist:  Althia Forts  ? ?Reason for Consultation:  Nausea/vomiting  ? ?HPI: [name] is a 46 y.o. female with a past medical history of anxiety, depression, morbid obesity, hypertension, nonobstructive CAD, history of LV thrombus, chronic systolic and diastolic CHF with LV EF 37%, nonischemic cardiomyopathy, paroxysmal atrial fibrillation on Xarelto, diabetes mellitus type 2, anemia, obstructive sleep apnea and chronic nausea.  ? ?She developed nausea, vomiting described as nonbloody bilious emesis, dizziness, CP, DOE with progressive fatigue over the past 5 days. She presented to the ED 11/01/2021 for further evaluation.  Labs in the ED showed a Na+ level of 136.  K+ 3.6.  BUN 9.  Creatinine 0.70.  Alk phos 92.  Total bili 0.8.  AST 11.  ALT 6.  WBC 9.3 11.1.  Hematocrit 35.  Platelet 355.  INR 1.1 BNP 181.7.  Digoxin < 0.2.  Troponin 10, repeat troponin 9. EKG showed sinus tachycardia with ventricular 102 bpm, no acute ischemic changes.  CTAP without contrast 11/02/2021 doubt evidence of any acute intra-abdominal/pelvic pathology, small hypodensities in the kidneys, likely cysts were noted. A GI consult was requested to further evaluate her chronic nausea with new onset vomiting. ? ?She is a vague historian.  She endorses having chronic nausea for at least 1 year.  She started vomiting 1 to 2 times daily which started 5 days ago.  She describes emesis as nonbloody bilious and sometimes emesis contained partially undigested food.  No dysphagia or heartburn.  No associated upper abdominal pain.  No NSAIDs.  No alcohol use.  She typically passes a normal formed brown bowel movement daily, however, since her hospitalization (10/17/2021 - 10/19/2021 secondary to acute on chronic CHF exacerbation secondary to medication nonadherence) she has been passing loose stools which she attributes to change in her  anticoagulation from Coumadin to Xarelto. Zoloft was also recently increased from 25 to 50 mg daily.  No recent antibiotics.  She describes passing 1 loose like mud brown bowel movement daily for the past 3 weeks with mild lower abdominal pain. No rectal bleeding or melena.  No history of GI bleed.  She has a history of menorrhagia.  Her menstrual cycles occur monthly, she has heavy menstrual bleeding day 1 through 3 in the last 2 days are lighter flow.  She received IV iron x1 during her recent hospital admission.  She denies ever receiving a blood transfusion.  She is not taking oral iron.  She remains on Xarelto. ? ?CTAP without contrast 11/03/2021: ?EXAM: ?CT ABDOMEN AND PELVIS WITHOUT CONTRAST ?  ?TECHNIQUE: ?Multidetector CT imaging of the abdomen and pelvis was performed ?following the standard protocol without IV contrast. ?  ?RADIATION DOSE REDUCTION: This exam was performed according to the ?departmental dose-optimization program which includes automated ?exposure control, adjustment of the mA and/or kV according to ?patient size and/or use of iterative reconstruction technique. ?  ?COMPARISON:  None available ?  ?FINDINGS: ?Lower chest: Minor subsegmental atelectasis at the left lung base. ?Mild cardiomegaly. ?  ?Hepatobiliary: No focal liver abnormality is seen. No gallstones, ?gallbladder wall thickening, or biliary dilatation. ?  ?Pancreas: Unremarkable. No pancreatic ductal dilatation or ?surrounding inflammatory changes. ?  ?Spleen: Normal in size without focal abnormality. ?  ?Adrenals/Urinary Tract: Adrenal glands appear normal. No ?nephrolithiasis or hydronephrosis identified bilaterally. Two ?hypodense possible renal cortical cysts identified bilaterally ?measuring up to 1.2 cm in the anterior right kidney. Urinary bladder ?appears grossly  normal, incompletely distended. ?  ?Stomach/Bowel: No bowel obstruction, free air or pneumatosis. No ?bowel wall edema identified. Appendix is normal. ?   ?Vascular/Lymphatic: Aortic atherosclerosis. No enlarged abdominal or ?pelvic lymph nodes. ?  ?Reproductive: Uterus and bilateral adnexa are unremarkable. ?  ?Other: No ascites. ?  ?Musculoskeletal: No acute or significant osseous findings. ?  ?IMPRESSION: ?1. No acute process identified in the abdomen or pelvis. ?2. Small hypodensities in the kidneys which likely represent cysts. ?Nonemergent follow-up renal ultrasound could be done if indicated. ?  ? ?Echo 11/02/2021: ? 1. Left ventricular ejection fraction, by estimation, is 20 to 25%. The  ?left ventricle has severely decreased function. The left ventricle  ?demonstrates global hypokinesis. There is mild concentric left ventricular  ?hypertrophy. Left ventricular diastolic  ? parameters are consistent with Grade III diastolic dysfunction  ?(restrictive).  ? 2. Right ventricular systolic function is normal. The right ventricular  ?size is normal. There is normal pulmonary artery systolic pressure.  ? 3. The mitral valve is normal in structure. Trivial mitral valve  ?regurgitation. No evidence of mitral stenosis.  ? 4. The aortic valve is normal in structure. Aortic valve regurgitation is  ?not visualized. No aortic stenosis is present.  ? 5. The inferior vena cava is normal in size with greater than 50%  ?respiratory variability, suggesting right atrial pressure of 3 mmHg. ? ?Past Medical History:  ?Diagnosis Date  ? Abnormal Pap smear   ? Acute on chronic combined systolic and diastolic CHF (congestive heart failure) ([place]) 02/19/2019  ? Acute on chronic congestive heart failure ([place])   ? Acute on chronic congestive heart failure ([place])   ? Acute on chronic diastolic CHF (congestive heart failure) ([place]) 10/19/2021  ? Anemia   ? Asthma   ? Atrial fibrillation ([place])   ? history of paroxysmal atrial fibrillation  ? CHF exacerbation ([place]) 10/17/2021  ? Diabetes mellitus   ? DKA, type 2 ([place]) 02/06/2019  ? Elevated troponin 09/30/2019  ? Heart failure with reduced ejection  fraction ([place])   ? History of Left ventricular thrombus 09/22/2021  ? Hydrosalpinx 08/21/2009  ? Dx on Pelvic US in Jan 2011.  Consider chronic etiology given continued fertility issues  ? Hypertension   ? Hypertensive emergency 09/30/2019  ? Major depressive disorder 02/08/2012  ? Reports that 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    ? Migraines   ? Morbid obesity ([place]) 02/06/2019  ? Nonischemic cardiomyopathy ([place]) 09/22/2021  ? Obstructive sleep apnea 06/30/2015  ? Polycystic ovarian disease   ? ? ?Past Surgical History:  ?Procedure Laterality Date  ? CERVIX LESION DESTRUCTION    ? CESAREAN SECTION    ? RIGHT/LEFT HEART CATH AND CORONARY ANGIOGRAPHY N/A 03/29/2019  ? Procedure: RIGHT/LEFT HEART CATH AND CORONARY ANGIOGRAPHY;  Surgeon: Larey Dresser, MD;  Location: [facility];  Service: Cardiovascular;  Laterality: N/A;  ? RIGHT/LEFT HEART CATH AND CORONARY ANGIOGRAPHY N/A 09/20/2021  ? Procedure: RIGHT/LEFT HEART CATH AND CORONARY ANGIOGRAPHY;  Surgeon: Larey Dresser, MD;  Location: [facility];  Service: Cardiovascular;  Laterality: N/A;  ? ? ?Prior to Admission medications   ?Medication Sig Start Date End Date Taking? Authorizing Provider  ?atorvastatin (LIPITOR) 40 MG tablet TAKE 1 TABLET BY MOUTH DAILY AT 6 PM. 04/01/20 08/27/22 Yes Larey Dresser, MD  ?carvedilol (COREG) 6.25 MG tablet Take 1 tablet (6.25 mg total) by mouth 2 (two) times daily  with a meal. 09/22/21  Yes Gladys Damme, MD  ?hydrOXYzine (ATARAX) 10 MG tablet Take 1 tablet (10 mg total) by mouth 3 (three) times daily as needed. ?Patient taking differently: Take 10 mg by mouth 3 (three) times daily as needed for anxiety. 10/22/21  Yes Gladys Damme, MD  ?insulin aspart (NOVOLOG FLEXPEN) 100 UNIT/ML FlexPen Inject 12 Units into the skin daily before supper. 09/02/21  Yes Hensel, Jamal Collin, MD  ?insulin glargine (LANTUS) 100 UNIT/ML Solostar Pen Inject 36 Units into the skin  every morning. 09/02/21  Yes Hensel, Jamal Collin, MD  ?rivaroxaban (XARELTO) 20 MG TABS tablet Take 1 tablet (20 mg total) by mouth daily with supper. 10/19/21  Yes Wells Guiles, DO  ?sacubitril-valsartan (ENTRESTO) 97-

## 2021-11-03 NOTE — Progress Notes (Signed)
Inpatient Diabetes Program Recommendations ? ?AACE/ADA: New Consensus Statement on Inpatient Glycemic Control (2015) ? ?Target Ranges:  Prepandial:   less than 140 mg/dL ?     Peak postprandial:   less than 180 mg/dL (1-2 hours) ?     Critically ill patients:  140 - 180 mg/dL  ? ?Lab Results  ?Component Value Date  ? GLUCAP 263 (H) 11/03/2021  ? HGBA1C 9.0 (H) 11/02/2021  ? ? ?Review of Glycemic Control ? ?Diabetes history: type 2 ?Outpatient Diabetes medications: Lantus 36 units daily, Novolog 12 units at supper ?Current orders for Inpatient glycemic control: Semglee 18 units daily, Novolog 0-15 units TID, Jardiance 25 mg daily ? ?Inpatient Diabetes Program Recommendations:   ?Recommend increasing Semglee to 20 units, continuing Novolog MODERATE correction scale TID, continuing 0-5 units HS scale, and adding Novolog 3 units TID with meals if eating at least 50% of meal.  ? ?Harvel Ricks RN BSN CDE ?Diabetes Coordinator ?Pager: [phone]  8am-5pm  ? ? ? ?

## 2021-11-03 NOTE — Progress Notes (Signed)
Per Family Med okay to give Coreg, Aldactone, and entreso if map is >60. Will continue to monitor. Will continue POC ?

## 2021-11-04 ENCOUNTER — Inpatient Hospital Stay (HOSPITAL_COMMUNITY): Payer: Medicaid Other

## 2021-11-04 DIAGNOSIS — R112 Nausea with vomiting, unspecified: Secondary | ICD-10-CM

## 2021-11-04 DIAGNOSIS — I480 Paroxysmal atrial fibrillation: Secondary | ICD-10-CM | POA: Diagnosis not present

## 2021-11-04 DIAGNOSIS — R110 Nausea: Secondary | ICD-10-CM | POA: Diagnosis not present

## 2021-11-04 DIAGNOSIS — R42 Dizziness and giddiness: Secondary | ICD-10-CM | POA: Diagnosis not present

## 2021-11-04 DIAGNOSIS — F339 Major depressive disorder, recurrent, unspecified: Secondary | ICD-10-CM | POA: Diagnosis not present

## 2021-11-04 DIAGNOSIS — E1165 Type 2 diabetes mellitus with hyperglycemia: Secondary | ICD-10-CM

## 2021-11-04 DIAGNOSIS — I509 Heart failure, unspecified: Secondary | ICD-10-CM

## 2021-11-04 DIAGNOSIS — I5041 Acute combined systolic (congestive) and diastolic (congestive) heart failure: Secondary | ICD-10-CM | POA: Diagnosis not present

## 2021-11-04 LAB — COMPREHENSIVE METABOLIC PANEL
ALT: 7 U/L (ref 0–44)
AST: 13 U/L — ABNORMAL LOW (ref 15–41)
Albumin: 3.4 g/dL — ABNORMAL LOW (ref 3.5–5.0)
Alkaline Phosphatase: 96 U/L (ref 38–126)
Anion gap: 12 (ref 5–15)
BUN: 17 mg/dL (ref 6–20)
CO2: 25 mmol/L (ref 22–32)
Calcium: 9.5 mg/dL (ref 8.9–10.3)
Chloride: 98 mmol/L (ref 98–111)
Creatinine, Ser: 2.07 mg/dL — ABNORMAL HIGH (ref 0.44–1.00)
GFR, Estimated: 30 mL/min — ABNORMAL LOW (ref >60)
Glucose, Bld: 214 mg/dL — ABNORMAL HIGH (ref 70–99)
Potassium: 3.6 mmol/L (ref 3.5–5.1)
Sodium: 135 mmol/L (ref 135–145)
Total Bilirubin: 1.2 mg/dL (ref 0.3–1.2)
Total Protein: 8.3 g/dL — ABNORMAL HIGH (ref 6.5–8.1)

## 2021-11-04 LAB — GLUCOSE, CAPILLARY
Glucose-Capillary: 231 mg/dL — ABNORMAL HIGH (ref 70–99)
Glucose-Capillary: 178 mg/dL — ABNORMAL HIGH (ref 70–99)
Glucose-Capillary: 122 mg/dL — ABNORMAL HIGH (ref 70–99)
Glucose-Capillary: 159 mg/dL — ABNORMAL HIGH (ref 70–99)

## 2021-11-04 LAB — CBC
HCT: 41.4 % (ref 36.0–46.0)
Hemoglobin: 12.9 g/dL (ref 12.0–15.0)
MCH: 24.2 pg — ABNORMAL LOW (ref 26.0–34.0)
MCHC: 31.2 g/dL (ref 30.0–36.0)
MCV: 77.5 fL — ABNORMAL LOW (ref 80.0–100.0)
Platelets: 425 10*3/uL — ABNORMAL HIGH (ref 150–400)
RBC: 5.34 MIL/uL — ABNORMAL HIGH (ref 3.87–5.11)
RDW: 18.3 % — ABNORMAL HIGH (ref 11.5–15.5)
WBC: 14.9 10*3/uL — ABNORMAL HIGH (ref 4.0–10.5)
nRBC: 0 % (ref 0.0–0.2)

## 2021-11-04 LAB — LIPASE, BLOOD: Lipase: 28 U/L (ref 11–51)

## 2021-11-04 MED ORDER — METOCLOPRAMIDE HCL 5 MG PO TABS: 5.0000 mg | ORAL_TABLET | Freq: Three times a day (TID) | ORAL | Status: DC | PRN

## 2021-11-04 MED ORDER — SODIUM CHLORIDE 0.9 % IV SOLN: INTRAVENOUS | Status: DC

## 2021-11-04 MED ORDER — METOCLOPRAMIDE HCL 5 MG PO TABS: 10.0000 mg | ORAL_TABLET | Freq: Three times a day (TID) | ORAL | Status: DC | PRN

## 2021-11-04 MED ORDER — TORSEMIDE 20 MG PO TABS
60.0000 mg | ORAL_TABLET | Freq: Two times a day (BID) | ORAL | Status: DC
Start: 2021-11-05 — End: 2021-11-04

## 2021-11-04 NOTE — H&P (View-Only) (Signed)
? ? ? ?[facility] Progress Note ? ?CC:   Nausea/vomiting  ? ?Subjective: [name] is more awake and smiling today, decreased appetite.  She continues to have nausea which is well controlled on Zofran. No vomiting today.  ? ?Objective:  ?Vital signs in last 24 hours: ?Temp:  [98.1 ?F (36.7 ?C)-98.3 ?F (36.8 ?C)] 98.3 ?F (36.8 ?C) (04/13 1113) ?Pulse Rate:  [80-106] 98 (04/13 1113) ?Resp:  [18-19] 18 (04/13 1113) ?BP: (95-132)/(68-85) 113/83 (04/13 1113) ?SpO2:  [97 %-99 %] 98 % (04/13 1113) ?Weight:  [79.4 kg] 79.4 kg (04/13 0425) ?Last BM Date : 11/01/21 ? ?General: 46 year old female in no acute distress. ?Heart: Regular rate and rhythm, no murmurs. ?Pulm: Breath sounds clear throughout. ?Abdomen: Soft, nontender.  Positive bowel sounds to all 4 quadrants. ?Extremities:  Without edema. ?Neurologic:  Alert and  oriented x 4. Grossly normal neurologically. ?Psych:  Alert and cooperative. Normal mood and affect. ? ?Intake/Output from previous day: ?04/12 0701 - 04/13 0700 ?In: 250 [P.O.:250] ?Out: 2450 [Urine:2450] ?Intake/Output this shift: ?Total I/O ?In: 150 [P.O.:150] ?Out: 400 [Urine:400] ? ?Lab Results: ?Recent Labs  ?  11/02/21 ?1610 11/03/21 ?0422 11/04/21 ?0142  ?WBC 10.0 10.7* 14.9*  ?HGB 10.9* 13.0 12.9  ?HCT 35.8* 40.0 41.4  ?PLT 351 428* 425*  ? ?BMET ?Recent Labs  ?  11/02/21 ?1442 11/03/21 ?0422 11/04/21 ?0142  ?NA 135 136 135  ?K 3.6 3.9 3.6  ?CL 100 99 98  ?CO2 '27 26 25  '$ ?GLUCOSE 102* 260* 214*  ?BUN '7 10 17  '$ ?CREATININE 0.88 1.18* 2.07*  ?CALCIUM 9.4 9.5 9.5  ? ?LFT ?Recent Labs  ?  11/04/21 ?0142  ?PROT 8.3*  ?ALBUMIN 3.4*  ?AST 13*  ?ALT 7  ?ALKPHOS 96  ?BILITOT 1.2  ? ?PT/INR ?Recent Labs  ?  11/02/21 ?0241  ?LABPROT 13.8  ?INR 1.1  ? ?Hepatitis Panel ?No results for input(s): HEPBSAG, HCVAB, HEPAIGM, HEPBIGM in the last 72 hours. ? ?CT ABDOMEN PELVIS WO CONTRAST ? ?Result Date: 11/02/2021 ?CLINICAL DATA:  Diffuse abdominal pain EXAM: CT ABDOMEN AND PELVIS WITHOUT CONTRAST TECHNIQUE:  Multidetector CT imaging of the abdomen and pelvis was performed following the standard protocol without IV contrast. RADIATION DOSE REDUCTION: This exam was performed according to the departmental dose-optimization program which includes automated exposure control, adjustment of the mA and/or kV according to patient size and/or use of iterative reconstruction technique. COMPARISON:  None available FINDINGS: Lower chest: Minor subsegmental atelectasis at the left lung base. Mild cardiomegaly. Hepatobiliary: No focal liver abnormality is seen. No gallstones, gallbladder wall thickening, or biliary dilatation. Pancreas: Unremarkable. No pancreatic ductal dilatation or surrounding inflammatory changes. Spleen: Normal in size without focal abnormality. Adrenals/Urinary Tract: Adrenal glands appear normal. No nephrolithiasis or hydronephrosis identified bilaterally. Two hypodense possible renal cortical cysts identified bilaterally measuring up to 1.2 cm in the anterior right kidney. Urinary bladder appears grossly normal, incompletely distended. Stomach/Bowel: No bowel obstruction, free air or pneumatosis. No bowel wall edema identified. Appendix is normal. Vascular/Lymphatic: Aortic atherosclerosis. No enlarged abdominal or pelvic lymph nodes. Reproductive: Uterus and bilateral adnexa are unremarkable. Other: No ascites. Musculoskeletal: No acute or significant osseous findings. IMPRESSION: 1. No acute process identified in the abdomen or pelvis. 2. Small hypodensities in the kidneys which likely represent cysts. Nonemergent follow-up renal ultrasound could be done if indicated. Electronically Signed   By: Ofilia Neas M.D.   On: 11/02/2021 16:38  ? ?US RENAL ? ?Result Date: 11/03/2021 ?CLINICAL DATA:  46 year old female with history of  renal cyst. EXAM: RENAL / URINARY TRACT ULTRASOUND COMPLETE COMPARISON:  CT abdomen pelvis from 11/02/2021 FINDINGS: Right Kidney: Renal measurements: 8.9 x 4.1 x 4.9 cm = volume:  93.3 mL. Echogenicity within normal limits. Simple cortical cyst about the anterior right inferior pole measuring up to 1.3 cm. No solid mass or hydronephrosis visualized. Left Kidney: Renal measurements: 10.8 x 5.1 x 5.6 cm = volume: 160 mL. Echogenicity within normal limits. No mass or hydronephrosis visualized. Bladder: Appears normal for degree of bladder distention. Other: None. IMPRESSION: Simple right inferior pole cortical cyst measuring up to 1.3 cm. Ruthann Cancer, MD [facility] Electronically Signed   By: Ruthann Cancer M.D.   On: 11/03/2021 11:42   ? ?Assessment / Plan: ? ?60) 46 year old female with chronic nausea with new onset vomiting x 5 days, nonbloody emesis. No upper abdominal pain.  Normal LFTs. Lipase 28.  ?-EGD to rule out PUD/stricture. Cardiac clearance received by cardiology, ok to Hold Xarelto for 1 day. ?-Consider RUQ sonogram to evaluate the gallbladder ?-Continue Pantoprazole 40 mg daily ?-Ondansetron 4 mg p.o. every 8 hours as needed ?-May add Reglan '10mg'$  po Q 8 hrs for a few days as needed  ?-If the above evaluation negative, will plan on outpatient gastric emptying study to rule out gastroparesis ?  ?2) Chronic on acute iron deficiency anemia. History of menorrhagia.  No overt GI bleeding.  Admission Hg 11.1 (Hg level 12.9 on 09/22/2021) -> today Hg 12.9. Iron level 30, Saturation ratios 7 and TIBC 438 on 10/19/2021.  ?-See plan in #1 ?-Consider IV iron x1 during this hospitalization ?-Follow-up with GYN as outpatient ?  ?3) Loose stools with mild lower abdominal pain.  No BM within the past 24 hours. ?-Check GI pathogen panel and C.diff PCR if patient a loose stool ?-No plans for a diagnostic colonoscopy at this time ?  ?4) Chronic diastolic and systolic CHF, NICM. LV EF 20% - 25%.  On Entresto, Torsemide and Spironolactone ?  ?5) Paroxysmal atrial fibrillation on Xarelto ?-Hold Xarelto x1 day for planned EGD tomorrow  ? ?6)  Leukocytosis. WBC 14.9.  Afebrile. ?-Defer evaluation/management to the medical service ? ?7) AKI. Cr 1.18 -> 2.07 ?  ? ? ? ?Principal Problem: ?  Dizziness ?Active Problems: ?  Poorly controlled type 2 diabetes mellitus ([place]) ?  Hypertension associated with diabetes ([place]) ?  Major depressive disorder ?  Anemia due to blood loss, chronic ?  Shortness of breath ?  Obstructive sleep apnea ?  Hyperlipidemia associated with type 2 diabetes mellitus ([place]) ?  Chronic nausea ?  Chronic combined systolic and diastolic heart failure ([place]) ?  Paroxysmal atrial fibrillation ([facility]) ?  Mood disorder due to a general medical condition ?  Biventricular failure ([place]) ?  Morbid obesity ([place]) ?  Acute combined systolic and diastolic heart failure ([place]) ?  Chronic chest wall pain ?  Nonischemic cardiomyopathy ([place]) ?  Nonobstructive atherosclerosis of coronary artery ?  Depression, recurrent ([place]) ?  HF (heart failure) ([place]) ? ? ? ? LOS: 2 days  ? ?Noralyn Pick  11/04/2021, 12:52 PM ? ?I have taken a history, reviewed the chart and examined the patient. I performed a substantive portion of this encounter, including complete performance of at least one of the key components, in conjunction with the APP. I agree with the APP's note, impression and recommendations ? ?Patient's vomiting much improved with scheduled Zofran (note patient receiving 8 mg, not 4  mg), but still continues to feel nauseated with decreased appetite.  Able to keep down all oral medications.  Appreciate cardiology providing input on the relative safety of elective procedure. ?We will plan for upper endoscopy tomorrow, holding Xarelto today and possibly tomorrow. ? ?Madonna Flegal E. Candis Schatz, MD ?[facility] ? ? ? ?

## 2021-11-04 NOTE — Progress Notes (Signed)
OT Cancellation Note ? ?Patient Details ?Name: [name] ?MRN: [id number] ?DOB: Apr 16, 1976 ? ? ?Cancelled Treatment:    Reason Eval/Treat Not Completed: Other (comment) (Declined participation with OT, as noted earlier in PT note, "not wanting to throw up". Will attempt another day) ? ?Darrin Koman,HILLARY ?11/04/2021, 3:06 PM ?,Maurie Boettcher, OT/L  ? ?Acute OT Clinical Specialist ?Acute Rehabilitation Services ?Pager [phone] ?Office [phone]  ?

## 2021-11-04 NOTE — Progress Notes (Signed)
FPTS Interim Progress Note ? ?S:Went to bedside to check on patient, she was sleeping so I did not wake her. ? ?O: ?BP 122/85 (BP Location: Right Arm)   Pulse 80   Temp 98.1 ?F (36.7 ?C) (Oral)   Resp 19   Ht 4' 11.02" (1.499 m)   Wt 79.7 kg   SpO2 99%   BMI 35.47 kg/m?   ?General: Patient resting comfortably, in no acute distress. ? ?A/P: ?Vitals, orders and telemetry reviewed. Plan per day team.  ? Donney Dice, DO ?11/04/2021, 12:52 AM ?PGY-2, [place] ?Service pager [phone]  ?

## 2021-11-04 NOTE — Progress Notes (Addendum)
? ?  Pt with need for EGD.  Pt is stable to undergo and is in SR may hold xarelto for 48 hours prior to procedure and resume post with elevated Cr and if biopsy needed.   ? ?Cecilie Kicks, FNP-C ?At [facility]  ?[phone] or after 5pm and on weekends call [phone] ?11/04/2021.  ?

## 2021-11-04 NOTE — Progress Notes (Signed)
PT Cancellation Note ? ?Patient Details ?Name: [name] ?MRN: [id number] ?DOB: Jul 11, 1976 ? ? ?Cancelled Treatment:    Reason Eval/Treat Not Completed: Other (comment) (Pt refused PT adamantly.  "I feel nauseated and I dont want to throw up today.") ? ? ?Tadhg Eskew F Kalana Yust ?11/04/2021, 1:10 PM ?Shakeisha Horine M,PT ?Acute Rehab Services ?[phone] ?[phone] (pager)  ?

## 2021-11-04 NOTE — Progress Notes (Addendum)
History of ? ?Progress Note ? ?Patient Name: [name] ?Date of Encounter: 11/04/2021 ? ?[facility] Cardiologist: Loralie Champagne, MD  ? ?Subjective  ? ?No chest pain, she feels better overall but at times SOB returns.   ? ?Today she stated she had planned to call [facility] once she was back from a cruise that she was supposed to be on this week.  ? ?Inpatient Medications  ?  ?Scheduled Meds: ? atorvastatin  40 mg Oral Daily  ? baclofen  5 mg Oral TID  ? carvedilol  6.25 mg Oral BID WC  ? diclofenac Sodium  2 g Topical QID  ? insulin aspart  0-15 Units Subcutaneous TID WC  ? insulin aspart  3 Units Subcutaneous TID WC  ? insulin glargine-yfgn  23 Units Subcutaneous Daily  ? ondansetron  8 mg Oral Q8H  ? pantoprazole  40 mg Oral Daily  ? sertraline  50 mg Oral Daily  ? ?Continuous Infusions: ? ?PRN Meds: ?acetaminophen **OR** acetaminophen, hydrOXYzine, traZODone  ? ?Vital Signs  ?  ?Vitals:  ? 11/03/21 2008 11/04/21 0004 11/04/21 0425 11/04/21 0800  ?BP: 104/68 122/85 106/70 132/82  ?Pulse: 80  (!) 106 99  ?Resp:   18 19  ?Temp: 98.1 ?F (36.7 ?C) 98.1 ?F (36.7 ?C) 98.3 ?F (36.8 ?C) 98.2 ?F (36.8 ?C)  ?TempSrc: Oral Oral Oral Oral  ?SpO2: 99%  97% 98%  ?Weight:   79.4 kg   ?Height:      ? ? ?Intake/Output Summary (Last 24 hours) at 11/04/2021 1050 ?Last data filed at 11/04/2021 0800 ?Gross per 24 hour  ?Intake 250 ml  ?Output 1150 ml  ?Net -900 ml  ? ? ?  11/04/2021  ?  4:25 AM 11/03/2021  ?  3:27 AM 11/02/2021  ? 12:14 PM  ?Last 3 Weights  ?Weight (lbs) 175 lb 175 lb 11.3 oz 178 lb 2.1 oz  ?Weight (kg) 79.379 kg 79.7 kg 80.8 kg  ?   ? ?Telemetry  ?  ?SR - Personally Reviewed ? ?ECG  ?  ?No new - Personally Reviewed ? ?Physical Exam  ? ?GEN: No acute distress.   ?Neck: No JVD ?Cardiac: RRR though rapid at times, no murmurs, rubs, or gallops.  ?Respiratory: Clear though diminished to auscultation bilaterally. ?GI: Soft, nontender, non-distended  ?MS: No edema; No deformity. ?Neuro:  Nonfocal  ?Psych: Normal affect   ? ?Labs  ?  ?High Sensitivity Troponin:   ?Recent Labs  ?Lab 10/17/21 ?1917 10/18/21 ?6195 11/01/21 ?2056  ?TROPONINIHS '13 14 9  10  '$ ?   ?Chemistry ?Recent Labs  ?Lab 11/01/21 ?2056 11/02/21 ?0932 11/02/21 ?1442 11/03/21 ?0422 11/04/21 ?0142  ?NA 136 137 135 136 135  ?K 3.6 3.8 3.6 3.9 3.6  ?CL 108 108 100 99 98  ?CO2 21* '22 27 26 25  '$ ?GLUCOSE 166* 245* 102* 260* 214*  ?BUN '9 10 7 10 17  '$ ?CREATININE 0.70 0.82 0.88 1.18* 2.07*  ?CALCIUM 8.9 8.9 9.4 9.5 9.5  ?MG 1.7  --   --  2.2  --   ?PROT 7.3 7.0  --   --  8.3*  ?ALBUMIN 3.0* 2.8*  --   --  3.4*  ?AST 11* 12*  --   --  13*  ?ALT 6 7  --   --  7  ?ALKPHOS 92 85  --   --  96  ?BILITOT 0.8 0.7  --   --  1.2  ?GFRNONAA >60 >60 >60 59* 30*  ?ANIONGAP  $'7 7 8 11 12  'c$ ?  ?Lipids No results for input(s): CHOL, TRIG, HDL, LABVLDL, LDLCALC, CHOLHDL in the last 168 hours.  ?Hematology ?Recent Labs  ?Lab 11/02/21 ?6962 11/03/21 ?0422 11/04/21 ?0142  ?WBC 10.0 10.7* 14.9*  ?RBC 4.56 5.26* 5.34*  ?HGB 10.9* 13.0 12.9  ?HCT 35.8* 40.0 41.4  ?MCV 78.5* 76.0* 77.5*  ?MCH 23.9* 24.7* 24.2*  ?MCHC 30.4 32.5 31.2  ?RDW 17.5* 17.8* 18.3*  ?PLT 351 428* 425*  ? ?Thyroid  ?Recent Labs  ?Lab 11/01/21 ?2056  ?TSH 1.730  ?  ?BNP ?Recent Labs  ?Lab 11/01/21 ?2314  ?BNP 181.7*  ?  ?DDimer No results for input(s): DDIMER in the last 168 hours.  ? ?Radiology  ?  ?CT ABDOMEN PELVIS WO CONTRAST ? ?Result Date: 11/02/2021 ?CLINICAL DATA:  Diffuse abdominal pain EXAM: CT ABDOMEN AND PELVIS WITHOUT CONTRAST TECHNIQUE: Multidetector CT imaging of the abdomen and pelvis was performed following the standard protocol without IV contrast. RADIATION DOSE REDUCTION: This exam was performed according to the departmental dose-optimization program which includes automated exposure control, adjustment of the mA and/or kV according to patient size and/or use of iterative reconstruction technique. COMPARISON:  None available FINDINGS: Lower chest: Minor subsegmental atelectasis at the left lung base. Mild  cardiomegaly. Hepatobiliary: No focal liver abnormality is seen. No gallstones, gallbladder wall thickening, or biliary dilatation. Pancreas: Unremarkable. No pancreatic ductal dilatation or surrounding inflammatory changes. Spleen: Normal in size without focal abnormality. Adrenals/Urinary Tract: Adrenal glands appear normal. No nephrolithiasis or hydronephrosis identified bilaterally. Two hypodense possible renal cortical cysts identified bilaterally measuring up to 1.2 cm in the anterior right kidney. Urinary bladder appears grossly normal, incompletely distended. Stomach/Bowel: No bowel obstruction, free air or pneumatosis. No bowel wall edema identified. Appendix is normal. Vascular/Lymphatic: Aortic atherosclerosis. No enlarged abdominal or pelvic lymph nodes. Reproductive: Uterus and bilateral adnexa are unremarkable. Other: No ascites. Musculoskeletal: No acute or significant osseous findings. IMPRESSION: 1. No acute process identified in the abdomen or pelvis. 2. Small hypodensities in the kidneys which likely represent cysts. Nonemergent follow-up renal ultrasound could be done if indicated. Electronically Signed   By: Ofilia Neas M.D.   On: 11/02/2021 16:38  ? ?US RENAL ? ?Result Date: 11/03/2021 ?CLINICAL DATA:  46 year old female with history of renal cyst. EXAM: RENAL / URINARY TRACT ULTRASOUND COMPLETE COMPARISON:  CT abdomen pelvis from 11/02/2021 FINDINGS: Right Kidney: Renal measurements: 8.9 x 4.1 x 4.9 cm = volume: 93.3 mL. Echogenicity within normal limits. Simple cortical cyst about the anterior right inferior pole measuring up to 1.3 cm. No solid mass or hydronephrosis visualized. Left Kidney: Renal measurements: 10.8 x 5.1 x 5.6 cm = volume: 160 mL. Echogenicity within normal limits. No mass or hydronephrosis visualized. Bladder: Appears normal for degree of bladder distention. Other: None. IMPRESSION: Simple right inferior pole cortical cyst measuring up to 1.3 cm. Ruthann Cancer, MD  [facility] Radiology Electronically Signed   By: Ruthann Cancer M.D.   On: 11/03/2021 11:42   ? ?Cardiac Studies  ? ?Echo from 11/02/21: ?  ? 1. Left ventricular ejection fraction, by estimation, is 20 to 25%. The  ?left ventricle has severely decreased function. The left ventricle  ?demonstrates global hypokinesis. There is mild concentric left ventricular  ?hypertrophy. Left ventricular diastolic  ? parameters are consistent with Grade III diastolic dysfunction  ?(restrictive).  ? 2. Right ventricular systolic function is normal. The right ventricular  ?size is normal. There is normal pulmonary artery systolic  pressure.  ? 3. The mitral valve is normal in structure. Trivial mitral valve  ?regurgitation. No evidence of mitral stenosis.  ? 4. The aortic valve is normal in structure. Aortic valve regurgitation is  ?not visualized. No aortic stenosis is present.  ? 5. The inferior vena cava is normal in size with greater than 50%  ?respiratory variability, suggesting right atrial pressure of 3 mmHg. ?  ?  ?Cardiac MRI on 09/20/21: ?  ?IMPRESSION: ?1. Mildly dilated LV with LV EF 23%, diffuse hypokinesis with ?inferior akinesis. ?  ?2.  Normal RV size with moderate systolic dysfunction, EF 45%. ?  ?3. No myocardial LGE so no definitive evidence for prior MI, ?infiltrative disease, or myocarditis. ?  ?  ?Right and left heart cath on 09/20/21: ?  ?  Mid Cx lesion is 30% stenosed. ?  Prox RCA to Mid RCA lesion is 25% stenosed. ?  ?1. Mild, nonobstructive CAD. Nonischemic cardiomyopathy.  ?2. Elevated right and left heart filling pressures.  ?3. Preserved cardiac output.  ?4. PAPI low at 1.5.  ?5. Pulmonary venous hypertension.  ?  ?Echo from 09/18/21: ?  ? 1. Left ventricular ejection fraction, by estimation, is 20%. The left  ?ventricle has severely decreased function. The left ventricle demonstrates  ?global hypokinesis. There is mild concentric left ventricular hypertrophy.   ?Left ventricular diastolic  ?parameters are consistent with Grade III diastolic dysfunction  ?(restrictive). Elevated left ventricular end-diastolic pressure.  ? 2. Right ventricular systolic function is severely reduc

## 2021-11-04 NOTE — Hospital Course (Addendum)
[name] is a 46 y.o.female with a history of HFrEF, paroxysmal A-fib, MDD, hypertension, type 2 diabetes, OSA, hyperlipidemia, anxiety, obesity, PCOS who was admitted to the [facility] at [facility] for new onset dizziness and nausea/vomiting. Her hospital course is detailed below: ? ?Heart failure with reduced ejection fraction ?Initial concern for HFrEF exacerbation due to changes in torsemide dosages. Echo with EF 20-25%, global hypokinesis and mild LVH. Patient's dry weight around 80 kg. Patient was resumed on her GDMT, specifically her spironolactone, Coreg, Entresto, torsemide.  Cardiology was consulted for HFrEF medication management.  Due to patient's AKI possibly within the setting of aggressive diuresis, torsemide was held for several days. Resumed on day of discharge and creatinine stable. She will need to restart Entresto in the outpatient setting, this was the only medication that was not started back by the time of discharge due to soft BP's. ? ?Non-Oliguric Pre-renal AKI  ?Patient's creatinine was increased to 2.07 likely in the setting of aggressive diuresis (received IV Lasix 120 mg day of admission).  Creatinine began to downtrend after holding torsemide. Creatinine at day of discharge is 1.01. ? ?Chronic Nausea and Vomiting ?Patient reports having several months of nausea and vomiting daily.  GI was consulted for work-up.  GI recommended EGD and scheduled Zofran for 2 days to assess whether this improves vomiting.  Xarelto was held for 2 days so EGD could be performed which was cleared by cardiology.  EGD was performed which showed no acute abnormalities.  RUQ ultrasound was also performed to assess for gallstones which showed no gallstones.  Metoclopramide was also added as needed because patient continued to complain of nausea despite Zofran dosing. Patient was recommended to continue scheduled Zofran until outpatient GI appointment as well as outpatient gastric emptying  test. ? ?Chest wall syndrome  ?Patient has chest pressure which appears to be chronic in nature.  Work up for ACS with Troponins negative. EKG without any ischemic findings. Chest pressure was considered to be due to chest wall syndrome for which rib belt was applied as well as baclofen 5 mg 3 times daily.  Baclofen did not help with chest pain so was eventually discontinued. NSAIDs were avoided given that patient was on Xarelto.  ? ?Leucocytosis ?WBC elevated to 14.9 during hospitalization. Not thought to be infectious but could be stress vs dehydration. WBC improved to 10.8 on discharge.  ? ?Dizziness  Vertigo ?Reported new onset vertigo when she was in the bathroom at home.  Patient reports vertigo does not worsen with standing up; however, it is positional.  Orthostatic vitals were negative.  Thought to possibly be due to BPPV though negative Dix-Hallpike and HINTS exam. Neuro exam unremarkable. MRI brain was ordered to evaluate for other central causes. This was without any acute findings though there were non-specific subcortical T2 hyperintensities b/l which could be seen in the setting of chronic microvascular ischemia, MS, vasculitis, complicated migraine headache or prior infectious/inflammatory process.  Patient recommended for vestibular rehab outpatient.  ? ?Other chronic conditions were medically managed with home medications and formulary alternatives as necessary (type 2 diabetes, PCOS, MDD, paroxysmal A-fib, hyperlipidemia, OSA, iron deficiency anemia) ? ?PCP Follow-up Recommendations: ?-BMP to assess electrolytes/creatinine. Resume Entresto 97/103 BID when appropriate (if renal function/BP allows) ?-Referral for vestibular assessment ?-Monitor nausea/vomiting ?-Consider referral to Psych/therapy for her MDD  ?-Assess medication compliance ? ?

## 2021-11-04 NOTE — Progress Notes (Addendum)
? ? ? ?[facility] Progress Note ? ?CC:   Nausea/vomiting  ? ?Subjective: [name] is more awake and smiling today, decreased appetite.  She continues to have nausea which is well controlled on Zofran. No vomiting today.  ? ?Objective:  ?Vital signs in last 24 hours: ?Temp:  [98.1 ?F (36.7 ?C)-98.3 ?F (36.8 ?C)] 98.3 ?F (36.8 ?C) (04/13 1113) ?Pulse Rate:  [80-106] 98 (04/13 1113) ?Resp:  [18-19] 18 (04/13 1113) ?BP: (95-132)/(68-85) 113/83 (04/13 1113) ?SpO2:  [97 %-99 %] 98 % (04/13 1113) ?Weight:  [79.4 kg] 79.4 kg (04/13 0425) ?Last BM Date : 11/01/21 ? ?General: 46 year old female in no acute distress. ?Heart: Regular rate and rhythm, no murmurs. ?Pulm: Breath sounds clear throughout. ?Abdomen: Soft, nontender.  Positive bowel sounds to all 4 quadrants. ?Extremities:  Without edema. ?Neurologic:  Alert and  oriented x 4. Grossly normal neurologically. ?Psych:  Alert and cooperative. Normal mood and affect. ? ?Intake/Output from previous day: ?04/12 0701 - 04/13 0700 ?In: 250 [P.O.:250] ?Out: 2450 [Urine:2450] ?Intake/Output this shift: ?Total I/O ?In: 150 [P.O.:150] ?Out: 400 [Urine:400] ? ?Lab Results: ?Recent Labs  ?  11/02/21 ?1761 11/03/21 ?0422 11/04/21 ?0142  ?WBC 10.0 10.7* 14.9*  ?HGB 10.9* 13.0 12.9  ?HCT 35.8* 40.0 41.4  ?PLT 351 428* 425*  ? ?BMET ?Recent Labs  ?  11/02/21 ?1442 11/03/21 ?0422 11/04/21 ?0142  ?NA 135 136 135  ?K 3.6 3.9 3.6  ?CL 100 99 98  ?CO2 '27 26 25  '$ ?GLUCOSE 102* 260* 214*  ?BUN '7 10 17  '$ ?CREATININE 0.88 1.18* 2.07*  ?CALCIUM 9.4 9.5 9.5  ? ?LFT ?Recent Labs  ?  11/04/21 ?0142  ?PROT 8.3*  ?ALBUMIN 3.4*  ?AST 13*  ?ALT 7  ?ALKPHOS 96  ?BILITOT 1.2  ? ?PT/INR ?Recent Labs  ?  11/02/21 ?0241  ?LABPROT 13.8  ?INR 1.1  ? ?Hepatitis Panel ?No results for input(s): HEPBSAG, HCVAB, HEPAIGM, HEPBIGM in the last 72 hours. ? ?CT ABDOMEN PELVIS WO CONTRAST ? ?Result Date: 11/02/2021 ?CLINICAL DATA:  Diffuse abdominal pain EXAM: CT ABDOMEN AND PELVIS WITHOUT CONTRAST TECHNIQUE:  Multidetector CT imaging of the abdomen and pelvis was performed following the standard protocol without IV contrast. RADIATION DOSE REDUCTION: This exam was performed according to the departmental dose-optimization program which includes automated exposure control, adjustment of the mA and/or kV according to patient size and/or use of iterative reconstruction technique. COMPARISON:  None available FINDINGS: Lower chest: Minor subsegmental atelectasis at the left lung base. Mild cardiomegaly. Hepatobiliary: No focal liver abnormality is seen. No gallstones, gallbladder wall thickening, or biliary dilatation. Pancreas: Unremarkable. No pancreatic ductal dilatation or surrounding inflammatory changes. Spleen: Normal in size without focal abnormality. Adrenals/Urinary Tract: Adrenal glands appear normal. No nephrolithiasis or hydronephrosis identified bilaterally. Two hypodense possible renal cortical cysts identified bilaterally measuring up to 1.2 cm in the anterior right kidney. Urinary bladder appears grossly normal, incompletely distended. Stomach/Bowel: No bowel obstruction, free air or pneumatosis. No bowel wall edema identified. Appendix is normal. Vascular/Lymphatic: Aortic atherosclerosis. No enlarged abdominal or pelvic lymph nodes. Reproductive: Uterus and bilateral adnexa are unremarkable. Other: No ascites. Musculoskeletal: No acute or significant osseous findings. IMPRESSION: 1. No acute process identified in the abdomen or pelvis. 2. Small hypodensities in the kidneys which likely represent cysts. Nonemergent follow-up renal ultrasound could be done if indicated. Electronically Signed   By: Ofilia Neas M.D.   On: 11/02/2021 16:38  ? ?US RENAL ? ?Result Date: 11/03/2021 ?CLINICAL DATA:  46 year old female with history of  renal cyst. EXAM: RENAL / URINARY TRACT ULTRASOUND COMPLETE COMPARISON:  CT abdomen pelvis from 11/02/2021 FINDINGS: Right Kidney: Renal measurements: 8.9 x 4.1 x 4.9 cm = volume:  93.3 mL. Echogenicity within normal limits. Simple cortical cyst about the anterior right inferior pole measuring up to 1.3 cm. No solid mass or hydronephrosis visualized. Left Kidney: Renal measurements: 10.8 x 5.1 x 5.6 cm = volume: 160 mL. Echogenicity within normal limits. No mass or hydronephrosis visualized. Bladder: Appears normal for degree of bladder distention. Other: None. IMPRESSION: Simple right inferior pole cortical cyst measuring up to 1.3 cm. Ruthann Cancer, MD [facility] Radiology Electronically Signed   By: Ruthann Cancer M.D.   On: 11/03/2021 11:42   ? ?Assessment / Plan: ? ?60) 46 year old female with chronic nausea with new onset vomiting x 5 days, nonbloody emesis. No upper abdominal pain.  Normal LFTs. Lipase 28.  ?-EGD to rule out PUD/stricture. Cardiac clearance received by cardiology, ok to Hold Xarelto for 1 day. ?-Consider RUQ sonogram to evaluate the gallbladder ?-Continue Pantoprazole 40 mg daily ?-Ondansetron 4 mg p.o. every 8 hours as needed ?-May add Reglan '10mg'$  po Q 8 hrs for a few days as needed  ?-If the above evaluation negative, will plan on outpatient gastric emptying study to rule out gastroparesis ?  ?2) Chronic on acute iron deficiency anemia. History of menorrhagia.  No overt GI bleeding.  Admission Hg 11.1 (Hg level 12.9 on 09/22/2021) -> today Hg 12.9. Iron level 30, Saturation ratios 7 and TIBC 438 on 10/19/2021.  ?-See plan in #1 ?-Consider IV iron x1 during this hospitalization ?-Follow-up with GYN as outpatient ?  ?3) Loose stools with mild lower abdominal pain.  No BM within the past 24 hours. ?-Check GI pathogen panel and C.diff PCR if patient a loose stool ?-No plans for a diagnostic colonoscopy at this time ?  ?4) Chronic diastolic and systolic CHF, NICM. LV EF 20% - 25%.  On Entresto, Torsemide and Spironolactone ?  ?5) Paroxysmal atrial fibrillation on Xarelto ?-Hold Xarelto x1 day for planned EGD tomorrow  ? ?6)  Leukocytosis. WBC 14.9.  Afebrile. ?-Defer evaluation/management to the medical service ? ?7) AKI. Cr 1.18 -> 2.07 ?  ? ? ? ?Principal Problem: ?  Dizziness ?Active Problems: ?  Poorly controlled type 2 diabetes mellitus ([place]) ?  Hypertension associated with diabetes ([place]) ?  Major depressive disorder ?  Anemia due to blood loss, chronic ?  Shortness of breath ?  Obstructive sleep apnea ?  Hyperlipidemia associated with type 2 diabetes mellitus ([place]) ?  Chronic nausea ?  Chronic combined systolic and diastolic heart failure ([place]) ?  Paroxysmal atrial fibrillation ([facility]) ?  Mood disorder due to a general medical condition ?  Biventricular failure ([place]) ?  Morbid obesity ([place]) ?  Acute combined systolic and diastolic heart failure ([place]) ?  Chronic chest wall pain ?  Nonischemic cardiomyopathy ([place]) ?  Nonobstructive atherosclerosis of coronary artery ?  Depression, recurrent ([place]) ?  HF (heart failure) ([place]) ? ? ? ? LOS: 2 days  ? ?Noralyn Pick  11/04/2021, 12:52 PM ? ?I have taken a history, reviewed the chart and examined the patient. I performed a substantive portion of this encounter, including complete performance of at least one of the key components, in conjunction with the APP. I agree with the APP's note, impression and recommendations ? ?Patient's vomiting much improved with scheduled Zofran (note patient receiving 8 mg, not 4  mg), but still continues to feel nauseated with decreased appetite.  Able to keep down all oral medications.  Appreciate cardiology providing input on the relative safety of elective procedure. ?We will plan for upper endoscopy tomorrow, holding Xarelto today and possibly tomorrow. ? ?Mylea Roarty E. Candis Schatz, MD ?[facility] ? ? ? ?

## 2021-11-04 NOTE — Progress Notes (Addendum)
FPTS Interim Progress Note ? ?S:Notified by nurse that patient experiencing chest pain. Went to bedside to assess concern. Patient states that this pain has persisted since she came to the hospital 4 days ago. Rates the pain as a severe, aching pain that is on the left side of chest and does not radiate. Denies dyspnea, nausea and vomiting.  ? ?O: ?BP 108/72 (BP Location: Left Arm)   Pulse 77   Temp 98.2 ?F (36.8 ?C) (Oral)   Resp 19   Ht 4' 11.02" (1.499 m)   Wt 79.4 kg   SpO2 99%   BMI 35.33 kg/m?   ?General: Patient sitting on the side of the bed eating chicken and mashed potatoes, in no acute distress. ?CV: RRR, no murmurs or gallops auscultated ?Resp: CTAB ?MSK: tenderness along left anterior chest wall upon light palpation ?Psych: mood appropriate  ? ?A/P: ?Vitals, orders and telemetry reviewed and appropriate. Chest pain likely secondary to MSK etiology, this appears to be a chronic manifestation for her. She is due for her baclofen which I instructed the nurse to give her along with applying voltaren gel to the affected area. She was also encouraged to alternate between applying ice and heat to the affected area to relieve some of the pain. Given chronic nature of pain and chest wall tenderness, very low concern for cardiac etiology. If pain changes in severity then will consider obtaining EKG and troponins. Conveyed to nurse that she may also take reglan if she experiences nausea/vomiting. Patient asked for stronger medication which I explained is not the best route at this time and would not provide her with optimal care, especially since many stronger pain medications further worsen renal function which is a concern for her given her worsening creatinine. Patient and nurse updated on plan. Instructed nurse to inform primary team if there are any changes to her chest pain or further concerns throughout the night, he agreed. NPO at midnight for EGD tomorrow. Remainder of plan per day team.  ? Donney Dice, DO ?11/04/2021, 11:02 PM ?PGY-2, [place] ?Service pager [phone]  ?

## 2021-11-04 NOTE — Progress Notes (Addendum)
Family Medicine Teaching Service ?Daily Progress Note ?Intern Pager: [phone] ? ?Patient name: [name] Medical record number: [id number] ?Date of birth: 10-Oct-1975 Age: 46 y.o. Gender: female ? ?Primary Care Provider: Gifford Shave, MD ?Consultants: Cardiology, GI ?Code Status: Full ? ?Pt Overview and Major Events to Date:  ?4/11 admit ?4/14 scheduled upper endoscopy ? ?Assessment and Plan: ?[name] is a 46 yo female presenting with vertigo, nausea/vomiting, and chronic chest pain, thought to be due to chest wall syndrome. PMHx significant for HFrEF, paroxysmal A-fib, MDD, HTN, T2DM, OSA, HLD, Anxiety, obesity, PCOS ? ?Vertigo 2/2 BPPV ?-Outpatient vestibular assessment referral ?-PT eval and treat ? ?HFrEF ?UOP 1.4 L. Net out 1.25 L. Cr 2.07> 1.59 ?-Cardiology consulted, appreciate recs ?-Continue home coreg ?-Continue to hold spiro, torsemide, and entresto ? ?AKI ?Cr 2.07>1.59. Improved with hold on diuresis. Baseline Cr 0.8 ?-AM BMP ? ?Chronic Nausea and Vomiting ?Stomach biopsied during EGD. Recommended for outpatient gastric emptying study. RUQ did not show stones ?-Continue scheduled zofran for now with PRN Reglan. ?-GI consult, appreciate recs ?-Protonix 40 mg qd ?-Upper endoscopy today, NPO since midnight, resume diet after endoscopy ?-Zofran 8 mg TID scheduled ?-Metoclopramide 10 mg TID prn ?-Chloraseptic spray for throat irritation ? ?Type 2 diabetes: Chronic, uncontrolled ?AM glucose 159.  ?-Continue Semglee 23 u daily ?-Moderate SSI with 3 u novolog with meals >50% eaten ? ?Leucocytosis ?WBC 14.9>13.1. Afebrile, VSS ?-Continue to monitor ? ?Chest pain ?-Rib belt ?-Baclofen 5 mg tid ?-Avoid NSAIDS ? ?Suprapubic pain ?PCOS ?Decreased Urine Output ?Renal U/S showed simple cyst ?-Continue UOP monitoring ? ?MDD: Chronic ?-Holding xarelto for EGD ?  ?Hyperlipidemia: Chronic, stable ?-Continue Lipitor 40 mg ?  ?Microcytic anemia: Chronic, stable ?Iron deficiency anemia ?Hgb 13.0>12.9 ?   ?OSA ?Refuses CPAP due to claustrophobia per patient ?-Lifestyle modification ? ?FEN/GI: NPO, resume diet after EGD ?PPx: SCD, holding xarelto ?Dispo:Home tomorrow. Barriers include ongoing medication adjustments.  ? ?Subjective:  ?Patient seen and assessed at bedside.  Patient reports feeling fatigued after EGD. Patient reports baclofen is not helping. All questions addressed. ? ?Objective: ?Temp:  [98.1 ?F (36.7 ?C)-98.3 ?F (36.8 ?C)] 98.3 ?F (36.8 ?C) (04/13 1113) ?Pulse Rate:  [80-106] 98 (04/13 1113) ?Resp:  [18-19] 18 (04/13 1113) ?BP: (95-132)/(68-85) 113/83 (04/13 1113) ?SpO2:  [97 %-99 %] 98 % (04/13 1113) ?Weight:  [79.4 kg] 79.4 kg (04/13 0425) ?Physical Exam: ?General: resting comfortably, NAD ?Cardiovascular: RRR ?Respiratory: CTAB ?Abdomen: soft, nontender ?Extremities: warm, dry ? ?Laboratory: ?Recent Labs  ?Lab 11/02/21 ?2010 11/03/21 ?0422 11/04/21 ?0142  ?WBC 10.0 10.7* 14.9*  ?HGB 10.9* 13.0 12.9  ?HCT 35.8* 40.0 41.4  ?PLT 351 428* 425*  ? ?Recent Labs  ?Lab 11/01/21 ?2056 11/02/21 ?0712 11/02/21 ?1442 11/03/21 ?0422 11/04/21 ?0142  ?NA 136 137 135 136 135  ?K 3.6 3.8 3.6 3.9 3.6  ?CL 108 108 100 99 98  ?CO2 21* '22 27 26 25  '$ ?BUN '9 10 7 10 17  '$ ?CREATININE 0.70 0.82 0.88 1.18* 2.07*  ?CALCIUM 8.9 8.9 9.4 9.5 9.5  ?PROT 7.3 7.0  --   --  8.3*  ?BILITOT 0.8 0.7  --   --  1.2  ?ALKPHOS 92 85  --   --  96  ?ALT 6 7  --   --  7  ?AST 11* 12*  --   --  13*  ?GLUCOSE 166* 245* 102* 260* 214*  ? ? ? ? ?Imaging/Diagnostic Tests: ?US Abdomen Limited RUQ (LIVER/GB) ? ?Result Date: 11/04/2021 ?CLINICAL  DATA:  Nausea and vomiting EXAM: ULTRASOUND ABDOMEN LIMITED RIGHT UPPER QUADRANT COMPARISON:  11/02/2021 FINDINGS: Gallbladder: No gallstones or wall thickening visualized. No sonographic Murphy sign noted by sonographer. Common bile duct: Diameter: 4 mm Liver: No focal lesion identified. Within normal limits in parenchymal echogenicity. Portal vein is patent on color Doppler imaging with normal direction of  blood flow towards the liver. Other: None. IMPRESSION: No acute abnormality noted. Electronically Signed   By: Inez Catalina M.D.   On: 11/04/2021 22:14   ? ?France Ravens, MD ?11/04/2021, 4:50 PM ?PGY-1, [place] ?[place] Intern pager: [phone], text pages welcome ?

## 2021-11-04 NOTE — Progress Notes (Addendum)
Family Medicine Teaching Service ?Daily Progress Note ?Intern Pager: [phone] ? ?Patient name: [name] Medical record number: [id number] ?Date of birth: 03-17-1976 Age: 46 y.o. Gender: female ? ?Primary Care Provider: Gifford Shave, MD ?Consultants: Cardiology, GI ?Code Status: Full ? ?Pt Overview and Major Events to Date:  ?4/11 admit ? ?Assessment and Plan: ?[name] is a 46 yo female presenting with vertigo, nausea/vomiting, and chronic chest pain, thought to be due to chest wall syndrome. PMHx significant for HFrEF, paroxysmal A-fib, MDD, HTN, T2DM, OSA, HLD, Anxiety, obesity, PCOS ? ?Vertigo 2/2 BPPV ?Negative Orthostatics. Possibly BPPV. Improved after PT/OT today.  ?-PT eval and treat ?-Outpatient vestibular Assessment--referral as outpatient ? ?Heart failure with reduced ejection fraction ?UOP 2.45 L, Net out 2.2 L. Cr 1.18>2.07. ?-Strict I/O, daily weight ?-Cardiology Consulted, appreciate recs ?-Demadex 60 mg BID, will hold today due to elevated creatinine ?-Continue home Coreg ?-Holding spiro and entresto ? ?AKI ?Cr 1.18>2.07. Possibly in light of aggressive diuresis ?-Holding nephrotoxic medication ? ?Chronic Nausea and Vomiting ?Denies nausea/vomiting today ?-GI consult appreciate recs ?-Protonix 40 mg qd ?-Holding Xarelto for possible EGD ?-Zofran 8 mg TID scheduled ?-Metoclopramide 10 mg TID prn ?-RUQ U/S for gallstones ? ?Type 2 diabetes: Chronic, uncontrolled ?A1c 14.0 > 9.0 this admission.  ?-Continue Semglee 23 u daily ?-Moderate SSI with 3 u novolog with meals >50% eaten ? ?Leucocytosis ?WBC 10.7>14.9 ?-Continue to monitor, afebrile, VSS ? ?Chest pain ?Continues to endorse but less bothered by it ?-Rib belt ?-Baclofen 5 mg tid ?-Avoid NSAIDS given Xarelto ? ?Suprapubic pain ?PCOS ?Decreased Urine Output ?Renal U/S showed simple cyst ?-Continue monitoring UOP ?-Bladder scans ? ?MDD: Chronic ?-Continue Zoloft, hydroxyzine, trazodone ? ?Paroxysmal A fib: Chronic, controlled ?-Holding  xarelto for EGD ? ?Hyperlipidemia: Chronic, stable ?-Continue Lipitor 40 mg ? ?Microcytic anemia: Chronic, stable ?Iron deficiency anemia ?Hgb 13.0>12.9 ? ?OSA ?Refuses CPAP due to claustrophobia per patient ?-Lifestyle modification ? ?FEN/GI: heart healthy, carb modified ?PPx: SCD ?Dispo:Home in 2-3 days. Barriers include ongoing medical workup.  ? ?Subjective:  ?Patient seen and assessed at bedside.  Patient denies any acute concerns at this time.  Plan to continue to monitor and hold Xarelto for upcoming EGD ? ?Objective: ?Temp:  [97.9 ?F (36.6 ?C)-98.3 ?F (36.8 ?C)] 98.3 ?F (36.8 ?C) (04/13 0425) ?Pulse Rate:  [80-106] 106 (04/13 0425) ?Resp:  [18-19] 18 (04/13 0425) ?BP: (93-122)/(54-85) 106/70 (04/13 0425) ?SpO2:  [97 %-99 %] 97 % (04/13 0425) ?Weight:  [175 lb (79.4 kg)] 175 lb (79.4 kg) (04/13 0425) ?Physical Exam: ?General: resting comfortably, NAD ?Cardiovascular: RRR ?Respiratory: CTAB ?Abdomen: soft, nontender ?Extremities: warm, dry ? ?Laboratory: ?Recent Labs  ?Lab 11/02/21 ?5361 11/03/21 ?0422 11/04/21 ?0142  ?WBC 10.0 10.7* 14.9*  ?HGB 10.9* 13.0 12.9  ?HCT 35.8* 40.0 41.4  ?PLT 351 428* 425*  ? ?Recent Labs  ?Lab 11/01/21 ?2056 11/02/21 ?4431 11/02/21 ?1442 11/03/21 ?0422 11/04/21 ?0142  ?NA 136 137 135 136 135  ?K 3.6 3.8 3.6 3.9 3.6  ?CL 108 108 100 99 98  ?CO2 21* '22 27 26 25  '$ ?BUN '9 10 7 10 17  '$ ?CREATININE 0.70 0.82 0.88 1.18* 2.07*  ?CALCIUM 8.9 8.9 9.4 9.5 9.5  ?PROT 7.3 7.0  --   --  8.3*  ?BILITOT 0.8 0.7  --   --  1.2  ?ALKPHOS 92 85  --   --  96  ?ALT 6 7  --   --  7  ?AST 11* 12*  --   --  13*  ?GLUCOSE 166* 245* 102* 260* 214*  ? ? ? ? ?Imaging/Diagnostic Tests: ?No results found. ? ?France Ravens, MD ?11/04/2021, 6:19 AM ?PGY-1, [place] [facility] ?[place] Intern pager: [phone], text pages welcome ?

## 2021-11-05 ENCOUNTER — Other Ambulatory Visit (HOSPITAL_COMMUNITY): Payer: Self-pay

## 2021-11-05 ENCOUNTER — Other Ambulatory Visit: Payer: Self-pay

## 2021-11-05 ENCOUNTER — Telehealth: Payer: Self-pay

## 2021-11-05 ENCOUNTER — Encounter (HOSPITAL_COMMUNITY)
Admission: EM | Disposition: A | Discharge status: Home / Self Care | Payer: Self-pay | Source: Home / Self Care | Attending: Family Medicine

## 2021-11-05 ENCOUNTER — Inpatient Hospital Stay (HOSPITAL_COMMUNITY): Payer: Medicaid Other | Admitting: Anesthesiology

## 2021-11-05 DIAGNOSIS — I428 Other cardiomyopathies: Secondary | ICD-10-CM | POA: Diagnosis not present

## 2021-11-05 DIAGNOSIS — N179 Acute kidney failure, unspecified: Secondary | ICD-10-CM | POA: Diagnosis not present

## 2021-11-05 DIAGNOSIS — Z794 Long term (current) use of insulin: Secondary | ICD-10-CM | POA: Diagnosis not present

## 2021-11-05 DIAGNOSIS — R112 Nausea with vomiting, unspecified: Secondary | ICD-10-CM

## 2021-11-05 DIAGNOSIS — J45909 Unspecified asthma, uncomplicated: Secondary | ICD-10-CM | POA: Diagnosis not present

## 2021-11-05 DIAGNOSIS — I152 Hypertension secondary to endocrine disorders: Secondary | ICD-10-CM | POA: Diagnosis not present

## 2021-11-05 DIAGNOSIS — E669 Obesity, unspecified: Secondary | ICD-10-CM | POA: Diagnosis not present

## 2021-11-05 DIAGNOSIS — E1122 Type 2 diabetes mellitus with diabetic chronic kidney disease: Secondary | ICD-10-CM

## 2021-11-05 DIAGNOSIS — R1084 Generalized abdominal pain: Secondary | ICD-10-CM

## 2021-11-05 DIAGNOSIS — D72829 Elevated white blood cell count, unspecified: Secondary | ICD-10-CM | POA: Diagnosis not present

## 2021-11-05 DIAGNOSIS — I130 Hypertensive heart and chronic kidney disease with heart failure and stage 1 through stage 4 chronic kidney disease, or unspecified chronic kidney disease: Secondary | ICD-10-CM

## 2021-11-05 DIAGNOSIS — I480 Paroxysmal atrial fibrillation: Secondary | ICD-10-CM | POA: Diagnosis not present

## 2021-11-05 DIAGNOSIS — N189 Chronic kidney disease, unspecified: Secondary | ICD-10-CM

## 2021-11-05 DIAGNOSIS — D500 Iron deficiency anemia secondary to blood loss (chronic): Secondary | ICD-10-CM | POA: Diagnosis not present

## 2021-11-05 DIAGNOSIS — R42 Dizziness and giddiness: Secondary | ICD-10-CM | POA: Diagnosis not present

## 2021-11-05 DIAGNOSIS — E861 Hypovolemia: Secondary | ICD-10-CM | POA: Diagnosis not present

## 2021-11-05 DIAGNOSIS — F339 Major depressive disorder, recurrent, unspecified: Secondary | ICD-10-CM | POA: Diagnosis not present

## 2021-11-05 DIAGNOSIS — I5041 Acute combined systolic (congestive) and diastolic (congestive) heart failure: Secondary | ICD-10-CM | POA: Diagnosis not present

## 2021-11-05 DIAGNOSIS — E1165 Type 2 diabetes mellitus with hyperglycemia: Secondary | ICD-10-CM | POA: Diagnosis not present

## 2021-11-05 DIAGNOSIS — I509 Heart failure, unspecified: Secondary | ICD-10-CM

## 2021-11-05 DIAGNOSIS — I5043 Acute on chronic combined systolic (congestive) and diastolic (congestive) heart failure: Secondary | ICD-10-CM | POA: Diagnosis not present

## 2021-11-05 DIAGNOSIS — I482 Chronic atrial fibrillation, unspecified: Secondary | ICD-10-CM | POA: Diagnosis not present

## 2021-11-05 HISTORY — PX: ESOPHAGOGASTRODUODENOSCOPY (EGD) WITH PROPOFOL: SHX5813

## 2021-11-05 HISTORY — PX: BIOPSY: SHX5522

## 2021-11-05 LAB — GLUCOSE, CAPILLARY
Glucose-Capillary: 159 mg/dL — ABNORMAL HIGH (ref 70–99)
Glucose-Capillary: 152 mg/dL — ABNORMAL HIGH (ref 70–99)
Glucose-Capillary: 158 mg/dL — ABNORMAL HIGH (ref 70–99)
Glucose-Capillary: 127 mg/dL — ABNORMAL HIGH (ref 70–99)
Glucose-Capillary: 136 mg/dL — ABNORMAL HIGH (ref 70–99)

## 2021-11-05 LAB — BASIC METABOLIC PANEL
Anion gap: 10 (ref 5–15)
BUN: 20 mg/dL (ref 6–20)
CO2: 26 mmol/L (ref 22–32)
Calcium: 9 mg/dL (ref 8.9–10.3)
Chloride: 96 mmol/L — ABNORMAL LOW (ref 98–111)
Creatinine, Ser: 1.59 mg/dL — ABNORMAL HIGH (ref 0.44–1.00)
GFR, Estimated: 41 mL/min — ABNORMAL LOW (ref >60)
Glucose, Bld: 171 mg/dL — ABNORMAL HIGH (ref 70–99)
Potassium: 3.4 mmol/L — ABNORMAL LOW (ref 3.5–5.1)
Sodium: 132 mmol/L — ABNORMAL LOW (ref 135–145)

## 2021-11-05 LAB — CBC
HCT: 38.9 % (ref 36.0–46.0)
Hemoglobin: 12.2 g/dL (ref 12.0–15.0)
MCH: 24.2 pg — ABNORMAL LOW (ref 26.0–34.0)
MCHC: 31.4 g/dL (ref 30.0–36.0)
MCV: 77 fL — ABNORMAL LOW (ref 80.0–100.0)
Platelets: 416 10*3/uL — ABNORMAL HIGH (ref 150–400)
RBC: 5.05 MIL/uL (ref 3.87–5.11)
RDW: 18 % — ABNORMAL HIGH (ref 11.5–15.5)
WBC: 13.1 10*3/uL — ABNORMAL HIGH (ref 4.0–10.5)
nRBC: 0 % (ref 0.0–0.2)

## 2021-11-05 SURGERY — ESOPHAGOGASTRODUODENOSCOPY (EGD) WITH PROPOFOL
Anesthesia: Monitor Anesthesia Care

## 2021-11-05 MED ORDER — RIVAROXABAN 15 MG PO TABS
15.0000 mg | ORAL_TABLET | Freq: Every day | ORAL | Status: DC
  Administered 2021-11-05 – 2021-11-06 (×2): 15 mg via ORAL
  Filled 2021-11-05 (×2): qty 1

## 2021-11-05 MED ORDER — TORSEMIDE 20 MG PO TABS: 40.0000 mg | ORAL_TABLET | Freq: Two times a day (BID) | ORAL | Status: DC

## 2021-11-05 MED ORDER — METOCLOPRAMIDE HCL 5 MG PO TABS
5.0000 mg | ORAL_TABLET | Freq: Three times a day (TID) | ORAL | 0 refills | Status: DC | PRN
  Filled 2021-11-05: qty 30, 10d supply, fill #0

## 2021-11-05 MED ORDER — RIVAROXABAN 20 MG PO TABS: 20.0000 mg | ORAL_TABLET | Freq: Every day | ORAL | Status: DC

## 2021-11-05 MED ORDER — PHENYLEPHRINE 40 MCG/ML (10ML) SYRINGE FOR IV PUSH (FOR BLOOD PRESSURE SUPPORT)
PREFILLED_SYRINGE | INTRAVENOUS | Status: DC | PRN
  Administered 2021-11-05: 160 ug via INTRAVENOUS

## 2021-11-05 MED ORDER — PROPOFOL 10 MG/ML IV BOLUS
INTRAVENOUS | Status: DC | PRN
  Administered 2021-11-05: 10 mg via INTRAVENOUS
  Administered 2021-11-05: 40 mg via INTRAVENOUS

## 2021-11-05 MED ORDER — ONDANSETRON 4 MG PO TBDP
4.0000 mg | ORAL_TABLET | Freq: Three times a day (TID) | ORAL | 0 refills | Status: DC
  Filled 2021-11-05: qty 42, 14d supply, fill #0

## 2021-11-05 MED ORDER — PROPOFOL 500 MG/50ML IV EMUL
INTRAVENOUS | Status: DC | PRN
  Administered 2021-11-05: 50 ug/kg/min via INTRAVENOUS

## 2021-11-05 MED ORDER — PHENOL 1.4 % MT LIQD: 1.0000 | OROMUCOSAL | Status: DC | PRN

## 2021-11-05 MED ORDER — ONDANSETRON HCL 4 MG/2ML IJ SOLN
INTRAMUSCULAR | Status: DC | PRN
  Administered 2021-11-05: 4 mg via INTRAVENOUS

## 2021-11-05 MED ORDER — POTASSIUM CHLORIDE CRYS ER 20 MEQ PO TBCR
40.0000 meq | EXTENDED_RELEASE_TABLET | Freq: Once | ORAL | Status: AC
  Administered 2021-11-05: 40 meq via ORAL
  Filled 2021-11-05: qty 2

## 2021-11-05 MED ORDER — PHENYLEPHRINE HCL-NACL 20-0.9 MG/250ML-% IV SOLN
INTRAVENOUS | Status: DC | PRN
  Administered 2021-11-05: 20 ug/min via INTRAVENOUS

## 2021-11-05 SURGICAL SUPPLY — 15 items

## 2021-11-05 NOTE — Progress Notes (Signed)
PT Cancellation Note ? ?Patient Details ?Name: BEKKA QIAN ?MRN: [id number] ?DOB: Jun 10, 1976 ? ? ?Cancelled Treatment:    Reason Eval/Treat Not Completed: Patient at procedure or test/unavailable (Pt in EGD.  Will return as able.) ? ? ?Ellanie Oppedisano F Christna Kulick ?11/05/2021, 8:46 AM ?Arrie Aran M,PT ?Acute Rehab Services ?[phone] ?[phone] (pager)  ? ? ?

## 2021-11-05 NOTE — Transfer of Care (Signed)
Immediate Anesthesia Transfer of Care Note ? ?Patient: [name] ? ?Procedure(s) Performed: ESOPHAGOGASTRODUODENOSCOPY (EGD) WITH PROPOFOL ?BIOPSY ? ?Patient Location: PACU ? ?Anesthesia Type:MAC ? ?Level of Consciousness: drowsy ? ?Airway & Oxygen Therapy: Patient Spontanous Breathing ? ?Post-op Assessment: Report given to RN and Post -op Vital signs reviewed and stable ? ?Post vital signs: Reviewed and stable ? ?Last Vitals:  ?Vitals Value Taken Time  ?BP    ?Temp    ?Pulse 102 11/05/21 0930  ?Resp 14 11/05/21 0930  ?SpO2 100 % 11/05/21 0930  ?Vitals shown include unvalidated device data. ? ?Last Pain:  ?Vitals:  ? 11/05/21 0814  ?TempSrc: Tympanic  ?PainSc: 9   ?   ? ?Patients Stated Pain Goal: 2 (11/04/21 0800) ? ?Complications: No notable events documented. ?

## 2021-11-05 NOTE — Telephone Encounter (Signed)
-----   Message from Daryel November, MD sent at 11/05/2021  9:47 AM EDT ----- ?Vaughan Basta,  ?Can you order a gastric emptying study and a hospital follow up clinic visit afterwards for Ms. Taulbee (dx: nausea/vomiting)? ?Thanks ? ?

## 2021-11-05 NOTE — Progress Notes (Addendum)
? ?Progress Note ? ?Patient Name: [name] ?Date of Encounter: 11/05/2021 ? ?[facility] Cardiologist: Loralie Champagne, MD  ? ?Subjective  ? ?Sleepy no chest pain no SOB ? ?Inpatient Medications  ?  ?Scheduled Meds: ? atorvastatin  40 mg Oral Daily  ? baclofen  5 mg Oral TID  ? carvedilol  6.25 mg Oral BID WC  ? diclofenac Sodium  2 g Topical QID  ? insulin aspart  0-15 Units Subcutaneous TID WC  ? insulin aspart  3 Units Subcutaneous TID WC  ? insulin glargine-yfgn  23 Units Subcutaneous Daily  ? ondansetron  8 mg Oral Q8H  ? pantoprazole  40 mg Oral Daily  ? sertraline  50 mg Oral Daily  ? ?Continuous Infusions: ? ?PRN Meds: ?acetaminophen **OR** acetaminophen, hydrOXYzine, metoCLOPramide, traZODone  ? ?Vital Signs  ?  ?Vitals:  ? 11/05/21 0930 11/05/21 0945 11/05/21 0958 11/05/21 1014  ?BP: 102/65 97/64 104/78 110/79  ?Pulse: (!) 101 89 88   ?Resp: 14 17 (!) 21 17  ?Temp: 98.4 ?F (36.9 ?C)  98.1 ?F (36.7 ?C)   ?TempSrc:      ?SpO2: 99% 100% 100% 99%  ?Weight:      ?Height:      ? ? ?Intake/Output Summary (Last 24 hours) at 11/05/2021 1051 ?Last data filed at 11/05/2021 1000 ?Gross per 24 hour  ?Intake 50 ml  ?Output 1000 ml  ?Net -950 ml  ? ? ?  11/05/2021  ?  4:35 AM 11/04/2021  ?  4:25 AM 11/03/2021  ?  3:27 AM  ?Last 3 Weights  ?Weight (lbs) 175 lb 11.2 oz 175 lb 175 lb 11.3 oz  ?Weight (kg) 79.697 kg 79.379 kg 79.7 kg  ?   ? ?Telemetry  ?  ?SR - Personally Reviewed ? ?ECG  ?  ?No new - Personally Reviewed ? ?Physical Exam  ? ?GEN: No acute distress.   ?Neck: No JVD ?Cardiac: RRR, no murmurs, rubs, or gallops.  ?Respiratory: Clear to auscultation bilaterally. ?GI: Soft, nontender, non-distended  ?MS: No edema; No deformity. ?Neuro:  Nonfocal  ?Psych: Normal affect  ? ?Labs  ?  ?High Sensitivity Troponin:   ?Recent Labs  ?Lab 10/17/21 ?1917 10/18/21 ?8921 11/01/21 ?2056  ?TROPONINIHS '13 14 9  10  '$ ?   ?Chemistry ?Recent Labs  ?Lab 11/01/21 ?2056 11/02/21 ?1941 11/02/21 ?1442 11/03/21 ?0422 11/04/21 ?0142  11/05/21 ?0413  ?NA 136 137   < > 136 135 132*  ?K 3.6 3.8   < > 3.9 3.6 3.4*  ?CL 108 108   < > 99 98 96*  ?CO2 21* 22   < > '26 25 26  '$ ?GLUCOSE 166* 245*   < > 260* 214* 171*  ?BUN 9 10   < > '10 17 20  '$ ?CREATININE 0.70 0.82   < > 1.18* 2.07* 1.59*  ?CALCIUM 8.9 8.9   < > 9.5 9.5 9.0  ?MG 1.7  --   --  2.2  --   --   ?PROT 7.3 7.0  --   --  8.3*  --   ?ALBUMIN 3.0* 2.8*  --   --  3.4*  --   ?AST 11* 12*  --   --  13*  --   ?ALT 6 7  --   --  7  --   ?ALKPHOS 92 85  --   --  96  --   ?BILITOT 0.8 0.7  --   --  1.2  --   ?  GFRNONAA >60 >60   < > 59* 30* 41*  ?ANIONGAP 7 7   < > '11 12 10  '$ ? < > = values in this interval not displayed.  ?  ?Lipids No results for input(s): CHOL, TRIG, HDL, LABVLDL, LDLCALC, CHOLHDL in the last 168 hours.  ?Hematology ?Recent Labs  ?Lab 11/03/21 ?0422 11/04/21 ?0142 11/05/21 ?0413  ?WBC 10.7* 14.9* 13.1*  ?RBC 5.26* 5.34* 5.05  ?HGB 13.0 12.9 12.2  ?HCT 40.0 41.4 38.9  ?MCV 76.0* 77.5* 77.0*  ?MCH 24.7* 24.2* 24.2*  ?MCHC 32.5 31.2 31.4  ?RDW 17.8* 18.3* 18.0*  ?PLT 428* 425* 416*  ? ?Thyroid  ?Recent Labs  ?Lab 11/01/21 ?2056  ?TSH 1.730  ?  ?BNP ?Recent Labs  ?Lab 11/01/21 ?2314  ?BNP 181.7*  ?  ?DDimer No results for input(s): DDIMER in the last 168 hours.  ? ?Radiology  ?  ?US RENAL ? ?Result Date: 11/03/2021 ?CLINICAL DATA:  46 year old female with history of renal cyst. EXAM: RENAL / URINARY TRACT ULTRASOUND COMPLETE COMPARISON:  CT abdomen pelvis from 11/02/2021 FINDINGS: Right Kidney: Renal measurements: 8.9 x 4.1 x 4.9 cm = volume: 93.3 mL. Echogenicity within normal limits. Simple cortical cyst about the anterior right inferior pole measuring up to 1.3 cm. No solid mass or hydronephrosis visualized. Left Kidney: Renal measurements: 10.8 x 5.1 x 5.6 cm = volume: 160 mL. Echogenicity within normal limits. No mass or hydronephrosis visualized. Bladder: Appears normal for degree of bladder distention. Other: None. IMPRESSION: Simple right inferior pole cortical cyst measuring up to  1.3 cm. Ruthann Cancer, MD [facility] Radiology Electronically Signed   By: Ruthann Cancer M.D.   On: 11/03/2021 11:42  ? ?US Abdomen Limited RUQ (LIVER/GB) ? ?Result Date: 11/04/2021 ?CLINICAL DATA:  Nausea and vomiting EXAM: ULTRASOUND ABDOMEN LIMITED RIGHT UPPER QUADRANT COMPARISON:  11/02/2021 FINDINGS: Gallbladder: No gallstones or wall thickening visualized. No sonographic Murphy sign noted by sonographer. Common bile duct: Diameter: 4 mm Liver: No focal lesion identified. Within normal limits in parenchymal echogenicity. Portal vein is patent on color Doppler imaging with normal direction of blood flow towards the liver. Other: None. IMPRESSION: No acute abnormality noted. Electronically Signed   By: Inez Catalina M.D.   On: 11/04/2021 22:14   ? ?Cardiac Studies  ? ?Echo from 11/02/21: ?  ? 1. Left ventricular ejection fraction, by estimation, is 20 to 25%. The  ?left ventricle has severely decreased function. The left ventricle  ?demonstrates global hypokinesis. There is mild concentric left ventricular  ?hypertrophy. Left ventricular diastolic  ? parameters are consistent with Grade III diastolic dysfunction  ?(restrictive).  ? 2. Right ventricular systolic function is normal. The right ventricular  ?size is normal. There is normal pulmonary artery systolic pressure.  ? 3. The mitral valve is normal in structure. Trivial mitral valve  ?regurgitation. No evidence of mitral stenosis.  ? 4. The aortic valve is normal in structure. Aortic valve regurgitation is  ?not visualized. No aortic stenosis is present.  ? 5. The inferior vena cava is normal in size with greater than 50%  ?respiratory variability, suggesting right atrial pressure of 3 mmHg. ?  ?  ?Cardiac MRI on 09/20/21: ?  ?IMPRESSION: ?1. Mildly dilated LV with LV EF 23%, diffuse hypokinesis with ?inferior akinesis. ?  ?2.  Normal RV size with moderate systolic dysfunction, EF 54%. ?  ?3. No myocardial LGE so  no definitive evidence for prior MI, ?infiltrative disease, or myocarditis. ?  ?  ?  Right and left heart cath on 09/20/21: ?  ?  Mid Cx lesion is 30% stenosed. ?  Prox RCA to Mid RCA lesion is 25% stenosed. ?  ?1. Mild, nonobstructive CAD. Nonischemic cardiomyopathy.  ?2. Elevated right and left heart filling pressures.  ?3. Preserved cardiac output.  ?4. PAPI low at 1.5.  ?5. Pulmonary venous hypertension.  ?  ?Echo from 09/18/21: ?  ? 1. Left ventricular ejection fraction, by estimation, is 20%. The left  ?ventricle has severely decreased function. The left ventricle demonstrates  ?global hypokinesis. There is mild concentric left ventricular hypertrophy.  ?Left ventricular diastolic  ?parameters are consistent with Grade III diastolic dysfunction  ?(restrictive). Elevated left ventricular end-diastolic pressure.  ? 2. Right ventricular systolic function is severely reduced. The right  ?ventricular size is normal. There is moderately elevated pulmonary artery  ?systolic pressure. The estimated right ventricular systolic pressure is  ?40.1 mmHg.  ? 3. Left atrial size was mildly dilated.  ? 4. The mitral valve is normal in structure. Mild mitral valve  ?regurgitation. No evidence of mitral stenosis.  ? 5. The aortic valve is normal in structure. Aortic valve regurgitation is  ?not visualized. No aortic stenosis is present.  ? 6. The inferior vena cava is dilated in size with <50% respiratory  ?variability, suggesting right atrial pressure of 15 mmHg.  ?  ?  ? ?Patient Profile  ?   ?46 y.o. female   hx of systolic and diastolic heart failure, NICM, nonobstructive CAD, history of LV thrombus, paroxysmal atrial fibrillation, type 2 diabetes, hypertension, hyperlipidemia, obesity, OSA, medical noncompliance, PCOS now with decompensated heart failure.    ?  ? ?Assessment & Plan  ?  ?Acute on chronic systolic and diastolic heart failure ?Nonischemic cardiomyopathy ?- remains non-compliant with diuretics, states taking all  other CHF meds  ?- last seen by family clinic 10/22/21 and was told to increase torsemide '60mg'$  BID but continue take it BID at home; digoxin level <0.2 sub-therapeutic;  ?- Echo today showed EF20-25%, LV glob

## 2021-11-05 NOTE — Anesthesia Preprocedure Evaluation (Signed)
Anesthesia Evaluation  ?Patient identified by MRN, date of birth, ID band ?Patient awake ? ? ? ?Reviewed: ?Allergy & Precautions, NPO status , Patient's Chart, lab work & pertinent test results, reviewed documented beta blocker date and time  ? ?Airway ?Mallampati: III ? ?TM Distance: >3 FB ?Neck ROM: Full ? ? ? Dental ? ?(+) Dental Advisory Given ?  ?Pulmonary ?asthma , sleep apnea ,  ?  ?Pulmonary exam normal ?breath sounds clear to auscultation ? ? ? ? ? ? Cardiovascular ?hypertension, Pt. on medications and Pt. on home beta blockers ?+CHF (LVEF 76-19%, grade 3 diastolic dysfunction)  ?Normal cardiovascular exam+ dysrhythmias (xarelto) Atrial Fibrillation  ?Rhythm:Regular Rate:Normal ? ?Echo 11/02/21: ? ??1. Left ventricular ejection fraction, by estimation, is 20 to 25%. The  ?left ventricle has severely decreased function. The left ventricle  ?demonstrates global hypokinesis. There is mild concentric left ventricular  ?hypertrophy. Left ventricular diastolic  ??parameters are consistent with Grade III diastolic dysfunction  ?(restrictive).  ??2. Right ventricular systolic function is normal. The right ventricular  ?size is normal. There is normal pulmonary artery systolic pressure.  ??3. The mitral valve is normal in structure. Trivial mitral valve  ?regurgitation. No evidence of mitral stenosis.  ??4. The aortic valve is normal in structure. Aortic valve regurgitation is  ?not visualized. No aortic stenosis is present.  ??5. The inferior vena cava is normal in size with greater than 50%  ?respiratory variability, suggesting right atrial pressure of 3 mmHg.  ? ?Cath ??  Mid Cx lesion is 30% stenosed. ??  Prox RCA to Mid RCA lesion is 25% stenosed. ?? ?1. Mild, nonobstructive CAD. Nonischemic cardiomyopathy.  ?2. Elevated right and left heart filling pressures.  ?3. Preserved cardiac output.  ?4. PAPI low at 1.5.  ?5. Pulmonary venous hypertension.  ?? ? ?  ?Neuro/Psych ?  Headaches, PSYCHIATRIC DISORDERS Depression   ? GI/Hepatic ?Neg liver ROS, N/V ?  ?Endo/Other  ?diabetes, Poorly Controlled, Type 2, Insulin DependentObesity BMI 35 ? Renal/GU ?Renal InsufficiencyRenal diseaseCr 1.6, CKD  ?negative genitourinary ?  ?Musculoskeletal ?negative musculoskeletal ROS ?(+)  ? Abdominal ?(+) + obese,   ?Peds ?negative pediatric ROS ?(+)  Hematology ?negative hematology ROS ?(+)   ?Anesthesia Other Findings ?Has been having chest pain since admission 4d ago, deemed MSK in nature by primary team, being treated w/ baclofen ? Reproductive/Obstetrics ?negative OB ROS ? ?  ? ? ? ? ? ? ? ? ? ? ? ? ? ?  ?  ? ? ? ? ? ? ? ? ?Anesthesia Physical ?Anesthesia Plan ? ?ASA: 4 ? ?Anesthesia Plan: MAC  ? ?Post-op Pain Management:   ? ?Induction:  ? ?PONV Risk Score and Plan: 2 and Propofol infusion and TIVA ? ?Airway Management Planned: Natural Airway and Simple Face Mask ? ?Additional Equipment: None ? ?Intra-op Plan:  ? ?Post-operative Plan:  ? ?Informed Consent: I have reviewed the patients History and Physical, chart, labs and discussed the procedure including the risks, benefits and alternatives for the proposed anesthesia with the patient or authorized representative who has indicated his/her understanding and acceptance.  ? ? ? ? ? ?Plan Discussed with: CRNA ? ?Anesthesia Plan Comments:   ? ? ? ? ? ? ?Anesthesia Quick Evaluation ? ?

## 2021-11-05 NOTE — Telephone Encounter (Signed)
Order placed for [facility], rad scheduling to contact pt to schedule this appt. Pt scheduled to see Dr. Candis Schatz 11/23/21 at 11:10am. ?

## 2021-11-05 NOTE — Interval H&P Note (Signed)
History and Physical Interval Note: ? ?11/05/2021 ?9:06 AM ? ?[name]  has presented today for surgery, with the diagnosis of Nausea, vomiting.  The various methods of treatment have been discussed with the patient and family. After consideration of risks, benefits and other options for treatment, the patient has consented to  Procedure(s): ?ESOPHAGOGASTRODUODENOSCOPY (EGD) WITH PROPOFOL (N/A) as a surgical intervention.  The patient's history has been reviewed, patient examined, no change in status, stable for surgery.  I have reviewed the patient's chart and labs.  Questions were answered to the patient's satisfaction.   ? ? ?Daryel November ? ? ?

## 2021-11-05 NOTE — Progress Notes (Signed)
OT Cancellation Note ? ?Patient Details ?Name: [name] ?MRN: [id number] ?DOB: 19-Apr-1976 ? ? ?Cancelled Treatment:    Reason Eval/Treat Not Completed: Patient at procedure or test/ unavailable.  Will attempt back as able.  ? ?Clearnce Sorrel Lorraine-COTA/L ?11/05/2021, 9:41 AM ?

## 2021-11-05 NOTE — Op Note (Signed)
[facility] ?Patient Name: [name] ?Procedure Date : 11/05/2021 ?MRN: [id number] ?Attending MD: Gladstone Pih. Candis Schatz , MD ?Date of Birth: September 23, 1975 ?CSN: [id number] ?Age: 46 ?Admit Type: Inpatient ?Procedure:                Upper GI endoscopy ?Indications:              Nausea with vomiting ?Providers:                Gladstone Pih. Candis Schatz, MD, Ladoris Gene, RN, Narda Rutherford  ?                          [place], Technician, Sydnee Cabal. Lovena Le CRNA ?Referring MD:              ?Medicines:                Monitored Anesthesia Care ?Complications:            No immediate complications. ?Estimated Blood Loss:     Estimated blood loss was minimal. ?Procedure:                Pre-Anesthesia Assessment: ?                          - Prior to the procedure, a History and Physical  ?                          was performed, and patient medications and  ?                          allergies were reviewed. The patient's tolerance of  ?                          previous anesthesia was also reviewed. The risks  ?                          and benefits of the procedure and the sedation  ?                          options and risks were discussed with the patient.  ?                          All questions were answered, and informed consent  ?                          was obtained. Prior Anticoagulants: The patient has  ?                          taken Xarelto (rivaroxaban), last dose was 2 days  ?                          prior to procedure. ASA Grade Assessment: IV - A  ?                          patient with severe systemic disease that is a  ?  constant threat to life. After reviewing the risks  ?                          and benefits, the patient was deemed in  ?                          satisfactory condition to undergo the procedure. ?                          After obtaining informed consent, the endoscope was  ?                          passed under direct vision. Throughout the  ?                           procedure, the patient's blood pressure, pulse, and  ?                          oxygen saturations were monitored continuously. The  ?                          GIF-[id number] ([id number] Olympus endoscope was introduced  ?                          through the mouth, and advanced to the third part  ?                          of duodenum. The upper GI endoscopy was  ?                          accomplished without difficulty. The patient  ?                          tolerated the procedure well. ?Scope In: ?Scope Out: ?Findings: ?     The examined portions of the nasopharynx, oropharynx and larynx were  ?     normal. ?     The examined esophagus was normal. ?     The entire examined stomach was normal. Biopsies were taken with a cold  ?     forceps for Helicobacter pylori testing. Estimated blood loss was  ?     minimal. ?     The examined duodenum was normal. ?Impression:               - The examined portions of the nasopharynx,  ?                          oropharynx and larynx were normal. ?                          - Normal esophagus. ?                          - Normal stomach. Biopsied. ?                          -  Normal examined duodenum. ?                          - No evidence of peptic ulcer disease or gastric  ?                          outlet stenosis/obstruction. No retained gastric  ?                          contents (food or medication) to suggest severe  ?                          gastroparesis. ?                          - Patient's nausea and vomiting most likely  ?                          multifactorial with underlying depression/mood  ?                          disorder playing a prominent role.  ?                          Hyperglycemia/poorly controlled diabetes, heart  ?                          failure and polypharmacy may also be contributing.  ?                          Gastroparesis also possible but I think less likely. ?Recommendation:           - Return patient to hospital ward for ongoing  care. ?                          - Resume previous diet. ?                          - Resume Xarelto (rivaroxaban) at prior dose today. ?                          - Await pathology results. ?                          - Recommend outpatient gastric emptying study ?                          - Continue scheduled zofran for now with PRN Reglan. ?                          - Recommend low fat/low fiber diet, avoid large  ?                          meals ?                          - GI  will sign off for now. Will arrange outpatient  ?                          follow up/gastric emptying study ?Procedure Code(s):        --- Professional --- ?                          [phone], Esophagogastroduodenoscopy, flexible,  ?                          transoral; with biopsy, single or multiple ?Diagnosis Code(s):        --- Professional --- ?                          R11.2, Nausea with vomiting, unspecified ?CPT copyright 2019 American Medical Association. All rights reserved. ?The codes documented in this report are preliminary and upon coder review may  ?be revised to meet current compliance requirements. ?Beadie Matsunaga E. Candis Schatz, MD ?11/05/2021 9:34:11 AM ?This report has been signed electronically. ?Number of Addenda: 0 ?

## 2021-11-05 NOTE — TOC Progression Note (Signed)
Transition of Care (TOC) - Progression Note  ? ? ?Patient Details  ?Name: [name] ?MRN: [id number] ?Date of Birth: 05-Nov-1975 ? ?Transition of Care (TOC) CM/SW Contact  ?Zenon Mayo, RN ?Phone Number: ?11/05/2021, 4:57 PM ? ?Clinical Narrative:    ?she is set up with outpatient neruro pt on AVS for dizziness. TOC will continue to follow for dc needs.  ? ? ?  ?  ? ?Expected Discharge Plan and Services ?  ?  ?  ?  ?  ?                ?  ?  ?  ?  ?  ?  ?  ?  ?  ?  ? ? ?Social Determinants of Health (SDOH) Interventions ?  ? ?Readmission Risk Interventions ? ?  10/01/2019  ? 12:21 PM 04/02/2019  ?  4:18 PM  ?Readmission Risk Prevention Plan  ?Transportation Screening Complete Complete  ?PCP or Specialist Appt within 3-5 Days Complete Complete  ?[place] or Home Care Consult Complete Complete  ?Social Work Consult for [place] Planning/Counseling Complete Complete  ?Palliative Care Screening Not Applicable Not Applicable  ?Medication Review Press photographer)  Complete  ? ? ?

## 2021-11-05 NOTE — Anesthesia Postprocedure Evaluation (Signed)
Anesthesia Post Note ? ?Patient: [name] ? ?Procedure(s) Performed: ESOPHAGOGASTRODUODENOSCOPY (EGD) WITH PROPOFOL ?BIOPSY ? ?  ? ?Patient location during evaluation: PACU ?Anesthesia Type: MAC ?Level of consciousness: awake and alert ?Pain management: pain level controlled ?Vital Signs Assessment: post-procedure vital signs reviewed and stable ?Respiratory status: spontaneous breathing, nonlabored ventilation and respiratory function stable ?Cardiovascular status: blood pressure returned to baseline and stable ?Postop Assessment: no apparent nausea or vomiting ?Anesthetic complications: no ? ? ?No notable events documented. ? ?Last Vitals:  ?Vitals:  ? 11/05/21 1100 11/05/21 1133  ?BP: (!) 89/61 99/66  ?Pulse: 89   ?Resp: 15   ?Temp:    ?SpO2: 98%   ?  ?Last Pain:  ?Vitals:  ? 11/05/21 0814  ?TempSrc: Tympanic  ?PainSc: 9   ? ? ?  ?  ?  ?  ?  ?  ? ?Jarome Matin Aaniyah Strohm ? ? ? ? ?

## 2021-11-05 NOTE — Progress Notes (Signed)
FPTS Brief Note ?Reviewed patient's vitals, recent notes.  ?Vitals:  ? 11/05/21 1133 11/05/21 1943  ?BP: 99/66 93/62  ?Pulse:    ?Resp:  19  ?Temp:  99 ?F (37.2 ?C)  ?SpO2:    ? ?At this time, no change in plan from day progress note.  ?Manali Mcelmurry, DO ?Page [phone] with questions about this patient.  ?  ?

## 2021-11-06 DIAGNOSIS — R42 Dizziness and giddiness: Secondary | ICD-10-CM | POA: Diagnosis not present

## 2021-11-06 DIAGNOSIS — I5042 Chronic combined systolic (congestive) and diastolic (congestive) heart failure: Secondary | ICD-10-CM | POA: Diagnosis not present

## 2021-11-06 DIAGNOSIS — I5041 Acute combined systolic (congestive) and diastolic (congestive) heart failure: Secondary | ICD-10-CM | POA: Diagnosis not present

## 2021-11-06 LAB — GLUCOSE, CAPILLARY
Glucose-Capillary: 162 mg/dL — ABNORMAL HIGH (ref 70–99)
Glucose-Capillary: 153 mg/dL — ABNORMAL HIGH (ref 70–99)
Glucose-Capillary: 131 mg/dL — ABNORMAL HIGH (ref 70–99)
Glucose-Capillary: 139 mg/dL — ABNORMAL HIGH (ref 70–99)
Glucose-Capillary: 194 mg/dL — ABNORMAL HIGH (ref 70–99)

## 2021-11-06 LAB — CBC
HCT: 38.1 % (ref 36.0–46.0)
Hemoglobin: 11.6 g/dL — ABNORMAL LOW (ref 12.0–15.0)
MCH: 24.2 pg — ABNORMAL LOW (ref 26.0–34.0)
MCHC: 30.4 g/dL (ref 30.0–36.0)
MCV: 79.5 fL — ABNORMAL LOW (ref 80.0–100.0)
Platelets: 379 10*3/uL (ref 150–400)
RBC: 4.79 MIL/uL (ref 3.87–5.11)
RDW: 18.2 % — ABNORMAL HIGH (ref 11.5–15.5)
WBC: 10.8 10*3/uL — ABNORMAL HIGH (ref 4.0–10.5)
nRBC: 0 % (ref 0.0–0.2)

## 2021-11-06 LAB — BASIC METABOLIC PANEL
Anion gap: 9 (ref 5–15)
BUN: 21 mg/dL — ABNORMAL HIGH (ref 6–20)
CO2: 24 mmol/L (ref 22–32)
Calcium: 9.2 mg/dL (ref 8.9–10.3)
Chloride: 103 mmol/L (ref 98–111)
Creatinine, Ser: 1.32 mg/dL — ABNORMAL HIGH (ref 0.44–1.00)
GFR, Estimated: 51 mL/min — ABNORMAL LOW (ref >60)
Glucose, Bld: 145 mg/dL — ABNORMAL HIGH (ref 70–99)
Potassium: 3.9 mmol/L (ref 3.5–5.1)
Sodium: 136 mmol/L (ref 135–145)

## 2021-11-06 MED ORDER — EMPAGLIFLOZIN 10 MG PO TABS
10.0000 mg | ORAL_TABLET | Freq: Every day | ORAL | Status: DC
Start: 2021-11-06 — End: 2021-11-08
  Administered 2021-11-06 – 2021-11-07 (×2): 10 mg via ORAL
  Filled 2021-11-06 (×2): qty 1

## 2021-11-06 NOTE — Progress Notes (Addendum)
Family Medicine Teaching Service ?Daily Progress Note ?Intern Pager: [phone] ? ?Patient name: [name] Medical record number: [id number] ?Date of birth: 11/24/1975 Age: 46 y.o. Gender: [name] ? ?Primary Care Provider: Gifford Shave, MD ?Consultants: Cardiology, GI ?Code Status: Full ? ?Pt Overview and Major Events to Date:  ?4/11 admit ?4/14 scheduled upper endoscopy ? ?Assessment and Plan: ?[name] is a 46 yo [name] presenting with vertigo, nausea/vomiting, and chronic chest pain, thought to be due to chest wall syndrome. PMHx significant for HFrEF, paroxysmal A-fib, MDD, HTN, T2DM, OSA, HLD, Anxiety, obesity, PCOS ? ?Vertigo 2/2 BPPV ?-Outpatient vestibular assessment referral ?-PT eval and treat ? ?HFrEF ?Weight unchanged. UOP 0.5 L, Cr 1.59>1.32. Normotensive but SBP low 100s, MAP 70s-80s. ?-Cardiology consulted, appreciate recs ?-Continue home coreg ?-Holding spiro, torsemide, entresto ?-Restart jardiance 10 mg ? ?AKI ?Continues to improve with 2 days of holding torsemide. Cr 1.59>1.32 ?-AM BMP ? ?Chronic Nausea and Vomiting ?-Scheduled Zofran 4 mg q8hour, PRN reglan ?-Outpatient GI for gastric emptying study ?-Continue protonix 40 mg qd ?-Chloraseptic spray for throat irritation ? ?Type 2 diabetes: Chronic ?Glucose in mid 100s ?-Semglee 23 u daily ?-Moderate SSI w/ 3 u meal coverage when >50% eaten ? ?Leucocytosis ?WBC 13.1>10.8 ?-AM CBC ? ?Chest wall Syndrome ?-Rib belt ?-Avoid NSAIDS ?-Refused baclofen due to it not helping ? ?Suprapubic pain ?PCOS ?Decreased Urine Output ?Renal U/S showed simple cyst ?-Continue UOP monitoring ?  ?MDD: Chronic ?-Continue hydroxyzine, zoloft, and trazodone ?  ?Hyperlipidemia: Chronic, stable ?-Continue Lipitor 40 mg ? ?PAF ?Not on beta blocker. NSR. ?-Continue Xarelto ?  ?Microcytic anemia: Chronic, stable ?Iron deficiency anemia ?Hgb 13.0>12.9 ? ?OSA ?Refuses CPAP due to claustrophobia per patient ?-Lifestyle modification ? ?FEN/GI: Heart Healthy ?PPx: SCD,  holding xarelto ?Dispo:Home tomorrow. Barriers include heart failure medication management.  ? ?Subjective:  ?Patient seen and assessed at bedside.  Patient continues to endorse some occasional nausea as well as vomiting yesterday.  Patient reports vomitus was clear due to not eating.  Discussed use of Chloraseptic spray and a Reglan.  Patient denies any other acute complaints at this time. ? ?Objective: ?Temp:  [98.1 ?F (36.7 ?C)-99 ?F (37.2 ?C)] 98.4 ?F (36.9 ?C) (04/15 0422) ?Pulse Rate:  [84-101] 88 (04/15 0422) ?Resp:  [14-21] 19 (04/15 0422) ?BP: (89-110)/(55-79) 102/55 (04/15 0422) ?SpO2:  [97 %-100 %] 97 % (04/15 0422) ?Weight:  [79.9 kg] 79.9 kg (04/15 0424) ?Physical Exam: ?General: Resting comfortably, NAD ?Cardiovascular: RRR ?Respiratory: CTAB ?Abdomen: soft, suprapubic pain on palpation ?Extremities: warm, dry ? ?Laboratory: ?Recent Labs  ?Lab 11/04/21 ?0142 11/05/21 ?0413 11/06/21 ?1751  ?WBC 14.9* 13.1* 10.8*  ?HGB 12.9 12.2 11.6*  ?HCT 41.4 38.9 38.1  ?PLT 425* 416* 379  ? ?Recent Labs  ?Lab 11/01/21 ?2056 11/02/21 ?0258 11/02/21 ?1442 11/04/21 ?0142 11/05/21 ?0413 11/06/21 ?0346  ?NA 136 137   < > 135 132* 136  ?K 3.6 3.8   < > 3.6 3.4* 3.9  ?CL 108 108   < > 98 96* 103  ?CO2 21* 22   < > '25 26 24  '$ ?BUN 9 10   < > 17 20 21*  ?CREATININE 0.70 0.82   < > 2.07* 1.59* 1.32*  ?CALCIUM 8.9 8.9   < > 9.5 9.0 9.2  ?PROT 7.3 7.0  --  8.3*  --   --   ?BILITOT 0.8 0.7  --  1.2  --   --   ?ALKPHOS 92 85  --  96  --   --   ?  ALT 6 7  --  7  --   --   ?AST 11* 12*  --  13*  --   --   ?GLUCOSE 166* 245*   < > 214* 171* 145*  ? < > = values in this interval not displayed.  ? ? ? ? ?Imaging/Diagnostic Tests: ?No results found. ? ?France Ravens, MD ?11/06/2021, 8:18 AM ?PGY-1, [facility] ?[place] Intern pager: [phone], text pages welcome ?

## 2021-11-06 NOTE — Progress Notes (Signed)
? ?Progress Note ? ?Patient Name: [name] ?Date of Encounter: 11/06/2021 ? ?[facility] Cardiologist: Loralie Champagne, MD  ? ?Subjective  ? ?No compalints ? ?Inpatient Medications  ?  ?Scheduled Meds: ? atorvastatin  40 mg Oral Daily  ? carvedilol  6.25 mg Oral BID WC  ? diclofenac Sodium  2 g Topical QID  ? insulin aspart  0-15 Units Subcutaneous TID WC  ? insulin aspart  3 Units Subcutaneous TID WC  ? insulin glargine-yfgn  23 Units Subcutaneous Daily  ? pantoprazole  40 mg Oral Daily  ? rivaroxaban  15 mg Oral Q supper  ? sertraline  50 mg Oral Daily  ? ?Continuous Infusions: ? ?PRN Meds: ?acetaminophen **OR** acetaminophen, hydrOXYzine, metoCLOPramide, phenol, traZODone  ? ?Vital Signs  ?  ?Vitals:  ? 11/05/21 1133 11/05/21 1943 11/06/21 0422 11/06/21 0424  ?BP: 99/66 93/62 (!) 102/55   ?Pulse:   88   ?Resp:  19 19   ?Temp:  99 ?F (37.2 ?C) 98.4 ?F (36.9 ?C)   ?TempSrc:  Oral Oral   ?SpO2:   97%   ?Weight:    79.9 kg  ?Height:      ? ? ?Intake/Output Summary (Last 24 hours) at 11/06/2021 0858 ?Last data filed at 11/06/2021 0006 ?Gross per 24 hour  ?Intake 410 ml  ?Output 500 ml  ?Net -90 ml  ? ? ?  11/06/2021  ?  4:24 AM 11/05/2021  ?  4:35 AM 11/04/2021  ?  4:25 AM  ?Last 3 Weights  ?Weight (lbs) 176 lb 3.2 oz 175 lb 11.2 oz 175 lb  ?Weight (kg) 79.924 kg 79.697 kg 79.379 kg  ?   ? ?Telemetry  ?  ?SR - Personally Reviewed ? ?ECG  ?  ?N/a - Personally Reviewed ? ?Physical Exam  ? ?GEN: No acute distress.   ?Neck: No JVD ?Cardiac: RRR, no murmurs, rubs, or gallops.  ?Respiratory: Clear to auscultation bilaterally. ?GI: Soft, nontender, non-distended  ?MS: No edema; No deformity. ?Neuro:  Nonfocal  ?Psych: Normal affect  ? ?Labs  ?  ?High Sensitivity Troponin:   ?Recent Labs  ?Lab 10/17/21 ?1917 10/18/21 ?6659 11/01/21 ?2056  ?TROPONINIHS '13 14 9  10  '$ ?   ?Chemistry ?Recent Labs  ?Lab 11/01/21 ?2056 11/02/21 ?9357 11/02/21 ?1442 11/03/21 ?0422 11/04/21 ?0142 11/05/21 ?0413 11/06/21 ?0177  ?NA 136 137   < >  136 135 132* 136  ?K 3.6 3.8   < > 3.9 3.6 3.4* 3.9  ?CL 108 108   < > 99 98 96* 103  ?CO2 21* 22   < > '26 25 26 24  '$ ?GLUCOSE 166* 245*   < > 260* 214* 171* 145*  ?BUN 9 10   < > '10 17 20 '$ 21*  ?CREATININE 0.70 0.82   < > 1.18* 2.07* 1.59* 1.32*  ?CALCIUM 8.9 8.9   < > 9.5 9.5 9.0 9.2  ?MG 1.7  --   --  2.2  --   --   --   ?PROT 7.3 7.0  --   --  8.3*  --   --   ?ALBUMIN 3.0* 2.8*  --   --  3.4*  --   --   ?AST 11* 12*  --   --  13*  --   --   ?ALT 6 7  --   --  7  --   --   ?ALKPHOS 92 85  --   --  96  --   --   ?  BILITOT 0.8 0.7  --   --  1.2  --   --   ?GFRNONAA >60 >60   < > 59* 30* 41* 51*  ?ANIONGAP 7 7   < > '11 12 10 9  '$ ? < > = values in this interval not displayed.  ?  ?Lipids No results for input(s): CHOL, TRIG, HDL, LABVLDL, LDLCALC, CHOLHDL in the last 168 hours.  ?Hematology ?Recent Labs  ?Lab 11/04/21 ?0142 11/05/21 ?0413 11/06/21 ?4166  ?WBC 14.9* 13.1* 10.8*  ?RBC 5.34* 5.05 4.79  ?HGB 12.9 12.2 11.6*  ?HCT 41.4 38.9 38.1  ?MCV 77.5* 77.0* 79.5*  ?MCH 24.2* 24.2* 24.2*  ?MCHC 31.2 31.4 30.4  ?RDW 18.3* 18.0* 18.2*  ?PLT 425* 416* 379  ? ?Thyroid  ?Recent Labs  ?Lab 11/01/21 ?2056  ?TSH 1.730  ?  ?BNP ?Recent Labs  ?Lab 11/01/21 ?2314  ?BNP 181.7*  ?  ?DDimer No results for input(s): DDIMER in the last 168 hours.  ? ?Radiology  ?  ?US Abdomen Limited RUQ (LIVER/GB) ? ?Result Date: 11/04/2021 ?CLINICAL DATA:  Nausea and vomiting EXAM: ULTRASOUND ABDOMEN LIMITED RIGHT UPPER QUADRANT COMPARISON:  11/02/2021 FINDINGS: Gallbladder: No gallstones or wall thickening visualized. No sonographic Murphy sign noted by sonographer. Common bile duct: Diameter: 4 mm Liver: No focal lesion identified. Within normal limits in parenchymal echogenicity. Portal vein is patent on color Doppler imaging with normal direction of blood flow towards the liver. Other: None. IMPRESSION: No acute abnormality noted. Electronically Signed   By: Inez Catalina M.D.   On: 11/04/2021 22:14   ? ?Cardiac Studies  ? ?Echo from 11/02/21: ?  ? 1.  Left ventricular ejection fraction, by estimation, is 20 to 25%. The  ?left ventricle has severely decreased function. The left ventricle  ?demonstrates global hypokinesis. There is mild concentric left ventricular  ?hypertrophy. Left ventricular diastolic  ? parameters are consistent with Grade III diastolic dysfunction  ?(restrictive).  ? 2. Right ventricular systolic function is normal. The right ventricular  ?size is normal. There is normal pulmonary artery systolic pressure.  ? 3. The mitral valve is normal in structure. Trivial mitral valve  ?regurgitation. No evidence of mitral stenosis.  ? 4. The aortic valve is normal in structure. Aortic valve regurgitation is  ?not visualized. No aortic stenosis is present.  ? 5. The inferior vena cava is normal in size with greater than 50%  ?respiratory variability, suggesting right atrial pressure of 3 mmHg. ?  ?  ?Cardiac MRI on 09/20/21: ?  ?IMPRESSION: ?1. Mildly dilated LV with LV EF 23%, diffuse hypokinesis with ?inferior akinesis. ?  ?2.  Normal RV size with moderate systolic dysfunction, EF 06%. ?  ?3. No myocardial LGE so no definitive evidence for prior MI, ?infiltrative disease, or myocarditis. ?  ?  ?Right and left heart cath on 09/20/21: ?  ?  Mid Cx lesion is 30% stenosed. ?  Prox RCA to Mid RCA lesion is 25% stenosed. ?  ?1. Mild, nonobstructive CAD. Nonischemic cardiomyopathy.  ?2. Elevated right and left heart filling pressures.  ?3. Preserved cardiac output.  ?4. PAPI low at 1.5.  ?5. Pulmonary venous hypertension.  ?  ?Echo from 09/18/21: ?  ? 1. Left ventricular ejection fraction, by estimation, is 20%. The left  ?ventricle has severely decreased function. The left ventricle demonstrates  ?global hypokinesis. There is mild concentric left ventricular hypertrophy.  ?Left ventricular diastolic  ?parameters are consistent with Grade III diastolic dysfunction  ?(restrictive). Elevated left ventricular end-diastolic pressure.  ?  2. Right ventricular systolic  function is severely reduced. The right  ?ventricular size is normal. There is moderately elevated pulmonary artery  ?systolic pressure. The estimated right ventricular systolic pressure is  ?51.8 mmHg.  ? 3. Left atrial size was mildly dilated.  ? 4. The mitral valve is normal in structure. Mild mitral valve  ?regurgitation. No evidence of mitral stenosis.  ? 5. The aortic valve is normal in structure. Aortic valve regurgitation is  ?not visualized. No aortic stenosis is present.  ? 6. The inferior vena cava is dilated in size with <50% respiratory  ?variability, suggesting right atrial pressure of 15 mmHg.  ?  ?  ? ?Patient Profile  ?   ?46 y.o. female   hx of systolic and diastolic heart failure, NICM, nonobstructive CAD, history of LV thrombus, paroxysmal atrial fibrillation, type 2 diabetes, hypertension, hyperlipidemia, obesity, OSA, medical noncompliance, PCOS now with decompensated heart failure.    ? ?Assessment & Plan  ?  ?1.Acute on chronic combined systolic/diastolic HF ?- 02/4165 echo LVEF 20-25%, grade III dd, normal RV function ?- noncompliant with diuretics at home, reports compliant with other meds ?- AKI this admission. Entresto, aldactone, digoxin, torsemide held. Peak Cr 2.07, down to 1.32 today. Even though renal function improved bp's are soft, would not restart meds at this time. Continue to hold toresmide. Restart jardiance '10mg'$  daily.  ?- appears euvolemic today without symptoms ? ? ?From H&P Most recently home regimen, she has been back on Entresto at 97/103 twice daily, spironolactone 25 mg daily, carvedilol 6.25 mg, low-dose digoxin, Jardiance 10 mg and torsemide ? ? ? ? ?2. PAF ?- continue beta blocker, eliquis ?- tele shows NSR ? ? ? ?3. HTN ?- soft bp's, meds on hold as reported above ? ?4. AKI ?- resolving, continue to hold torsemide today ? ? ? ?For questions or updates, please contact [place] ?Please consult [email or web address] for contact info under  ? ?  ?   ?Signed, ?Carlyle Dolly, MD  ?11/06/2021, 8:58 AM   ? ?

## 2021-11-07 ENCOUNTER — Inpatient Hospital Stay (HOSPITAL_COMMUNITY): Payer: Medicaid Other

## 2021-11-07 DIAGNOSIS — I5041 Acute combined systolic (congestive) and diastolic (congestive) heart failure: Secondary | ICD-10-CM | POA: Diagnosis not present

## 2021-11-07 DIAGNOSIS — R0789 Other chest pain: Secondary | ICD-10-CM

## 2021-11-07 DIAGNOSIS — I5042 Chronic combined systolic (congestive) and diastolic (congestive) heart failure: Secondary | ICD-10-CM

## 2021-11-07 DIAGNOSIS — R110 Nausea: Secondary | ICD-10-CM | POA: Diagnosis not present

## 2021-11-07 DIAGNOSIS — R42 Dizziness and giddiness: Secondary | ICD-10-CM | POA: Diagnosis not present

## 2021-11-07 DIAGNOSIS — G8929 Other chronic pain: Secondary | ICD-10-CM | POA: Diagnosis not present

## 2021-11-07 DIAGNOSIS — F339 Major depressive disorder, recurrent, unspecified: Secondary | ICD-10-CM | POA: Diagnosis not present

## 2021-11-07 LAB — GLUCOSE, CAPILLARY
Glucose-Capillary: 163 mg/dL — ABNORMAL HIGH (ref 70–99)
Glucose-Capillary: 140 mg/dL — ABNORMAL HIGH (ref 70–99)
Glucose-Capillary: 113 mg/dL — ABNORMAL HIGH (ref 70–99)

## 2021-11-07 LAB — BASIC METABOLIC PANEL
Anion gap: 7 (ref 5–15)
BUN: 19 mg/dL (ref 6–20)
CO2: 26 mmol/L (ref 22–32)
Calcium: 9 mg/dL (ref 8.9–10.3)
Chloride: 102 mmol/L (ref 98–111)
Creatinine, Ser: 1.01 mg/dL — ABNORMAL HIGH (ref 0.44–1.00)
GFR, Estimated: >60 mL/min (ref >60)
Glucose, Bld: 147 mg/dL — ABNORMAL HIGH (ref 70–99)
Potassium: 3.6 mmol/L (ref 3.5–5.1)
Sodium: 135 mmol/L (ref 135–145)

## 2021-11-07 MED ORDER — DIGOXIN 125 MCG PO TABS
0.1250 mg | ORAL_TABLET | Freq: Every day | ORAL | 1 refills | Status: DC
  Filled 2021-11-07: qty 30, 30d supply, fill #0

## 2021-11-07 MED ORDER — DIGOXIN 125 MCG PO TABS
0.1250 mg | ORAL_TABLET | Freq: Every day | ORAL | Status: DC
  Administered 2021-11-07: 0.125 mg via ORAL
  Filled 2021-11-07: qty 1

## 2021-11-07 MED ORDER — LOSARTAN POTASSIUM 25 MG PO TABS: 12.5000 mg | ORAL_TABLET | Freq: Every day | ORAL | Status: DC

## 2021-11-07 MED ORDER — EMPAGLIFLOZIN 10 MG PO TABS
10.0000 mg | ORAL_TABLET | Freq: Every day | ORAL | 1 refills | Status: DC
  Filled 2021-11-07 – 2021-11-08 (×2): qty 30, 30d supply, fill #0

## 2021-11-07 MED ORDER — TORSEMIDE 40 MG PO TABS
40.0000 mg | ORAL_TABLET | Freq: Two times a day (BID) | ORAL | 1 refills | Status: DC
  Filled 2021-11-07: qty 60, 30d supply, fill #0

## 2021-11-07 MED ORDER — SPIRONOLACTONE 12.5 MG HALF TABLET
12.5000 mg | ORAL_TABLET | Freq: Every day | ORAL | Status: DC
  Administered 2021-11-07: 12.5 mg via ORAL
  Filled 2021-11-07: qty 1

## 2021-11-07 MED ORDER — TORSEMIDE 20 MG PO TABS
40.0000 mg | ORAL_TABLET | Freq: Two times a day (BID) | ORAL | Status: DC
  Administered 2021-11-07 (×2): 40 mg via ORAL
  Filled 2021-11-07 (×2): qty 2

## 2021-11-07 MED ORDER — RIVAROXABAN 20 MG PO TABS
20.0000 mg | ORAL_TABLET | Freq: Every day | ORAL | Status: DC
  Administered 2021-11-07: 20 mg via ORAL
  Filled 2021-11-07: qty 1

## 2021-11-07 MED ORDER — LORAZEPAM 2 MG/ML IJ SOLN
1.0000 mg | Freq: Once | INTRAMUSCULAR | Status: AC
  Administered 2021-11-07: 1 mg via INTRAVENOUS
  Filled 2021-11-07: qty 1

## 2021-11-07 MED ORDER — POTASSIUM CHLORIDE CRYS ER 20 MEQ PO TBCR
40.0000 meq | EXTENDED_RELEASE_TABLET | Freq: Once | ORAL | Status: AC
  Administered 2021-11-07: 40 meq via ORAL
  Filled 2021-11-07: qty 2

## 2021-11-07 MED ORDER — SPIRONOLACTONE 25 MG PO TABS
12.5000 mg | ORAL_TABLET | Freq: Every day | ORAL | 1 refills | Status: DC
  Filled 2021-11-07: qty 30, 60d supply, fill #0

## 2021-11-07 NOTE — Discharge Summary (Addendum)
Family Medicine Teaching [facility] Discharge Summary  Patient name: [name] Medical record number: [id number] Date of birth: April 25, 1976 Age: 46 y.o. Gender: female Date of Admission: 11/01/2021  Date of Discharge: 11/07/2021 Admitting Physician: Westley Chandler, MD  Primary Care Provider: Derrel Nip, MD Consultants: Cardiology, GI   Indication for Hospitalization: Nausea, Vomiting, Chest Pain, HFrEF  Discharge Diagnoses/Problem List:  Principal Problem:   Dizziness Active Problems:   Poorly controlled type 2 diabetes mellitus (HCC)   Hypertension associated with diabetes (HCC)   Major depressive disorder   Anemia due to blood loss, chronic   Shortness of breath   Obstructive sleep apnea   Hyperlipidemia associated with type 2 diabetes mellitus (HCC)   Chronic nausea   Chronic combined systolic and diastolic heart failure (HCC)   Paroxysmal atrial fibrillation (HCC)   Mood disorder due to a general medical condition   Biventricular failure (HCC)   Morbid obesity (HCC)   Acute combined systolic and diastolic heart failure (HCC)   Chronic chest wall pain   Nonischemic cardiomyopathy (HCC)   Nonobstructive atherosclerosis of coronary artery   Depression, recurrent (HCC)   HF (heart failure) (HCC)   Nausea and vomiting  Disposition: Home  Discharge Condition: Stable  Discharge Exam:  Blood pressure 102/61, pulse 96, temperature 98.4 F (36.9 C), temperature source Oral, resp. rate 16, height 4' 11.02" (1.499 m), weight 80.7 kg, SpO2 97 %. Physical Exam: General: alert, sitting in bed in the dark, NAD Cardiovascular: RRR no murmurs  Respiratory: CTAB normal WOB Abdomen: soft, non distended  Extremities: warm, dry. No LE edema  Examined by Dr. Landry Corporal Course:  [name] is a 46 y.o.female with a history of HFrEF, paroxysmal A-fib, MDD, hypertension, type 2 diabetes, OSA, hyperlipidemia, anxiety, obesity, PCOS who was admitted  to the [facility] Teaching Service at [facility] for new onset dizziness and nausea/vomiting. Her hospital course is detailed below:  Heart failure with reduced ejection fraction Initial concern for HFrEF exacerbation due to changes in torsemide dosages. Echo with EF 20-25%, global hypokinesis and mild LVH. Patient's dry weight around 80 kg. Patient was resumed on her GDMT, specifically her spironolactone, Coreg, Entresto, torsemide.  Cardiology was consulted for HFrEF medication management.  Due to patient's AKI possibly within the setting of aggressive diuresis, torsemide was held for several days. Resumed on day of discharge and creatinine stable. She will need to restart Entresto in the outpatient setting, this was the only medication that was not started back by the time of discharge due to soft BP's (systolic low 100's/diastolic 70s).  Non-Oliguric Pre-renal AKI  Patient's creatinine was increased to 2.07 likely in the setting of aggressive diuresis (received IV Lasix 120 mg day of admission).  Creatinine began to downtrend after holding torsemide. Creatinine at day of discharge is 1.01.  Chronic Nausea and Vomiting Patient reports having several months of nausea and vomiting daily.  GI was consulted for work-up.  GI recommended EGD and scheduled Zofran for 2 days to assess whether this improves vomiting.  Xarelto was held for 2 days so EGD could be performed which was cleared by cardiology.  EGD was performed which showed no acute abnormalities.  RUQ ultrasound was also performed to assess for gallstones which showed no gallstones.  Metoclopramide was also added as needed because patient continued to complain of nausea despite Zofran dosing. Patient was recommended to continue scheduled Zofran with Reglan PRN until outpatient GI appointment as well as outpatient  gastric emptying test.  Chest wall syndrome  Patient has chest pressure which appears to be chronic in nature.  Work up for ACS with  Troponins negative. EKG without any ischemic findings. Chest pressure was considered to be due to chest wall syndrome for which rib belt was applied as well as baclofen 5 mg 3 times daily.  Baclofen did not help with chest pain so was eventually discontinued. NSAIDs were avoided given that patient was on Xarelto.   Leucocytosis WBC elevated to 14.9 during hospitalization. Not thought to be infectious but could be stress vs dehydration. WBC improved to 10.8 on discharge.   Dizziness  Vertigo Reported new onset vertigo when she was in the bathroom at home.  Patient reports vertigo does not worsen with standing up; however, it is positional.  Orthostatic vitals were negative.  Thought to possibly be due to BPPV though negative Dix-Hallpike and HINTS exam. Neuro exam unremarkable. MRI brain was ordered to evaluate for other central causes. This was without any acute findings though there were non-specific subcortical T2 hyperintensities b/l which could be seen in the setting of chronic microvascular ischemia, MS, vasculitis, complicated migraine headache or prior infectious/inflammatory process.  Patient recommended for vestibular rehab outpatient.   Other chronic conditions were medically managed with home medications and formulary alternatives as necessary (type 2 diabetes, PCOS, MDD, paroxysmal A-fib, hyperlipidemia, OSA, iron deficiency anemia)  PCP Follow-up Recommendations: -BMP to assess electrolytes/creatinine. Resume Entresto 97/103 BID when appropriate (if renal function/BP allows) -Referral for vestibular assessment -Monitor nausea/vomiting -Consider referral to Psych/therapy for her MDD  -Assess medication compliance  Significant Procedures:  4/14: EGD, unremarkable  4/11: Echo, EF 20-25%. Global hypokinesis.   Significant Labs and Imaging:  Recent Labs  Lab 11/04/21 0142 11/05/21 0413 11/06/21 0346  WBC 14.9* 13.1* 10.8*  HGB 12.9 12.2 11.6*  HCT 41.4 38.9 38.1  PLT 425* 416*  379   Recent Labs  Lab 11/01/21 2056 11/02/21 0521 11/02/21 1442 11/03/21 0422 11/04/21 0142 11/05/21 0413 11/06/21 0346 11/07/21 0418  NA 136 137   < > 136 135 132* 136 135  K 3.6 3.8   < > 3.9 3.6 3.4* 3.9 3.6  CL 108 108   < > 99 98 96* 103 102  CO2 21* 22   < > 26 25 26 24 26   GLUCOSE 166* 245*   < > 260* 214* 171* 145* 147*  BUN 9 10   < > 10 17 20  21* 19  CREATININE 0.70 0.82   < > 1.18* 2.07* 1.59* 1.32* 1.01*  CALCIUM 8.9 8.9   < > 9.5 9.5 9.0 9.2 9.0  MG 1.7  --   --  2.2  --   --   --   --   ALKPHOS 92 85  --   --  96  --   --   --   AST 11* 12*  --   --  13*  --   --   --   ALT 6 7  --   --  7  --   --   --   ALBUMIN 3.0* 2.8*  --   --  3.4*  --   --   --    < > = values in this interval not displayed.    MRI Brain 4/16 IMPRESSION: 1. No acute intracranial abnormality. 2. Periventricular and subcortical T2 hyperintensities bilaterally are mildly advanced for age. The finding is nonspecific but can be seen in the setting of  chronic microvascular ischemia, a demyelinating process such as multiple sclerosis, vasculitis, complicated migraine headaches, or as the sequelae of a prior infectious or inflammatory process.  RUQ U/S 4/13 IMPRESSION: No acute abnormality noted.  Renal U/S 4/12 IMPRESSION: Simple right inferior pole cortical cyst measuring up to 1.3 cm.  CT Abd/Pelv 4/11 IMPRESSION: 1. No acute process identified in the abdomen or pelvis. 2. Small hypodensities in the kidneys which likely represent cysts. Nonemergent follow-up renal ultrasound could be done if indicated.  CXR 4/10 IMPRESSION: Cardiomegaly with mildly distended pulmonary vasculature.  Results/Tests Pending at Time of Discharge: None  Discharge Medications:  Allergies as of 11/07/2021       Reactions   Shellfish Allergy Swelling   Airway involvement including EMS - Has Epi-Pen   Metformin And Related Diarrhea   Trulicity [dulaglutide] Nausea And Vomiting, Other (See  Comments)   Multiple episodes of vomiting over multiple days   Ibuprofen Other (See Comments)   Per PCP interferes with daily meds (heart failure)        Medication List     STOP taking these medications    Entresto 97-103 MG Generic drug: sacubitril-valsartan       TAKE these medications    Accu-Chek Aviva Plus w/Device Kit 1 application by Does not apply route QID. Check fasting sugars as well as throughout the day   Accu-Chek Softclix Lancet Dev Kit 1 application by Does not apply route 2 (two) times daily.   Accu-Chek Softclix Lancets lancets Use as instructed   albuterol 108 (90 Base) MCG/ACT inhaler Commonly known as: VENTOLIN HFA Inhale 2 puffs into the lungs every 6 (six) hours as needed for wheezing or shortness of breath.   atorvastatin 40 MG tablet Commonly known as: LIPITOR TAKE 1 TABLET BY MOUTH DAILY AT 6 PM.   blood glucose meter kit and supplies Kit Dispense based on patient and insurance preference. Use up to four times daily as directed. (FOR ICD-9 250.00, 250.01).   carvedilol 6.25 MG tablet Commonly known as: COREG Take 1 tablet (6.25 mg total) by mouth 2 (two) times daily with a meal.   digoxin 0.125 MG tablet Commonly known as: LANOXIN Take 1 tablet (0.125 mg total) by mouth daily. Start taking on: November 08, 2021   empagliflozin 10 MG Tabs tablet Commonly known as: JARDIANCE Take 1 tablet (10 mg total) by mouth daily. Start taking on: November 08, 2021   glucose blood test strip Commonly known as: AgaMatrix [employer] Use as instructed   Accu-Chek Aviva Plus test strip Generic drug: glucose blood Check CBG 2 times per day   hydrOXYzine 10 MG tablet Commonly known as: ATARAX Take 1 tablet (10 mg total) by mouth 3 (three) times daily as needed. What changed: reasons to take this   INSULIN SYRINGE .3CC/31GX5/16" 31G X 5/16" 0.3 ML Misc 15 Units by Does not apply route 2 (two) times daily.   Lantus SoloStar 100 UNIT/ML Solostar  Pen Generic drug: insulin glargine Inject 36 Units into the skin every morning.   metoCLOPramide 5 MG tablet Commonly known as: Reglan Take 1 tablet (5 mg total) by mouth every 8 (eight) hours as needed for nausea.   NovoLOG FlexPen 100 UNIT/ML FlexPen Generic drug: insulin aspart Inject 12 Units into the skin daily before supper.   ondansetron 4 MG disintegrating tablet Commonly known as: ZOFRAN-ODT Take 1 tablet (4 mg total) by mouth every 8 (eight) hours for 14 days.   sertraline 50 MG tablet Commonly known as:  ZOLOFT Take 1 tablet (50 mg total) by mouth daily.   spironolactone 25 MG tablet Commonly known as: ALDACTONE Take 0.5 tablets (12.5 mg total) by mouth daily. Start taking on: November 08, 2021 What changed: how much to take   Torsemide 40 MG Tabs Take 40 mg by mouth 2 (two) times daily. Start taking on: November 08, 2021 What changed:  medication strength how much to take   traZODone 50 MG tablet Commonly known as: DESYREL Take 1 tablet (50 mg total) by mouth at bedtime.   Unifine Pentips 31G X 5 MM Misc Generic drug: Insulin Pen Needle USE AS DIRECTED 2 TIMES DAILY   Xarelto 20 MG Tabs tablet Generic drug: rivaroxaban Take 1 tablet (20 mg total) by mouth daily with supper.        Discharge Instructions: Please refer to Patient Instructions section of EMR for full details.  Patient was counseled important signs and symptoms that should prompt return to medical care, changes in medications, dietary instructions, activity restrictions, and follow up appointments.   Follow-Up Appointments:  Follow-up Information     [facility] Follow [facility].   Specialty: Rehabilitation Why: please call to schedule yout apt times for outpatient physical therapy for dizziness Contact information: [address] [phone] mc [place] [address] [phone]        [facility]. Go on  11/08/2021.   Specialty: Respiratory Therapy Why: Please go to your scheduled appointment at 11:10.  Please arrive at least 15 minutes early. Contact information: [address] [phone] mc [address] [place] [address] [phone]                Sabino Dick, DO 11/07/2021, 6:23 PM PGY-2, [facility]

## 2021-11-07 NOTE — Progress Notes (Signed)
? ?Progress Note ? ?Patient Name: [name] ?Date of Encounter: 11/07/2021 ? ?[facility] Cardiologist: Loralie Champagne, MD  ? ?Subjective  ? ?No significant SOB ? ?Inpatient Medications  ?  ?Scheduled Meds: ? atorvastatin  40 mg Oral Daily  ? carvedilol  6.25 mg Oral BID WC  ? diclofenac Sodium  2 g Topical QID  ? empagliflozin  10 mg Oral Daily  ? insulin aspart  0-15 Units Subcutaneous TID WC  ? insulin aspart  3 Units Subcutaneous TID WC  ? insulin glargine-yfgn  23 Units Subcutaneous Daily  ? pantoprazole  40 mg Oral Daily  ? rivaroxaban  15 mg Oral Q supper  ? sertraline  50 mg Oral Daily  ? ?Continuous Infusions: ? ?PRN Meds: ?acetaminophen **OR** acetaminophen, hydrOXYzine, metoCLOPramide, phenol, traZODone  ? ?Vital Signs  ?  ?Vitals:  ? 11/06/21 1624 11/06/21 1949 11/07/21 0347 11/07/21 0017  ?BP: 112/69 105/69 (!) 104/56 113/69  ?Pulse: 76   85  ?Resp: '16 18 17 15  '$ ?Temp: 98.5 ?F (36.9 ?C) 98.5 ?F (36.9 ?C) 98.1 ?F (36.7 ?C) 98.2 ?F (36.8 ?C)  ?TempSrc: Oral Oral Oral Oral  ?SpO2: 100% 100% 99% 96%  ?Weight:   80.7 kg   ?Height:      ? ? ?Intake/Output Summary (Last 24 hours) at 11/07/2021 0912 ?Last data filed at 11/07/2021 0800 ?Gross per 24 hour  ?Intake 920 ml  ?Output 750 ml  ?Net 170 ml  ? ? ?  11/07/2021  ?  3:47 AM 11/06/2021  ?  4:24 AM 11/05/2021  ?  4:35 AM  ?Last 3 Weights  ?Weight (lbs) 178 lb 176 lb 3.2 oz 175 lb 11.2 oz  ?Weight (kg) 80.74 kg 79.924 kg 79.697 kg  ?   ? ?Telemetry  ?  ?NSR - Personally Reviewed ? ?ECG  ?  ?N/a - Personally Reviewed ? ?Physical Exam  ? ?GEN: No acute distress.   ?Neck: No JVD ?Cardiac: RRR, no murmurs, rubs, or gallops.  ?Respiratory: Clear to auscultation bilaterally. ?GI: Soft, nontender, non-distended  ?MS: No edema; No deformity. ?Neuro:  Nonfocal  ?Psych: Normal affect  ? ?Labs  ?  ?High Sensitivity Troponin:   ?Recent Labs  ?Lab 10/17/21 ?1917 10/18/21 ?4944 11/01/21 ?2056  ?TROPONINIHS '13 14 9  10  '$ ?   ?Chemistry ?Recent Labs  ?Lab 11/01/21 ?2056  11/02/21 ?9675 11/02/21 ?1442 11/03/21 ?0422 11/04/21 ?0142 11/05/21 ?0413 11/06/21 ?9163 11/07/21 ?8466  ?NA 136 137   < > 136 135 132* 136 135  ?K 3.6 3.8   < > 3.9 3.6 3.4* 3.9 3.6  ?CL 108 108   < > 99 98 96* 103 102  ?CO2 21* 22   < > '26 25 26 24 26  '$ ?GLUCOSE 166* 245*   < > 260* 214* 171* 145* 147*  ?BUN 9 10   < > '10 17 20 '$ 21* 19  ?CREATININE 0.70 0.82   < > 1.18* 2.07* 1.59* 1.32* 1.01*  ?CALCIUM 8.9 8.9   < > 9.5 9.5 9.0 9.2 9.0  ?MG 1.7  --   --  2.2  --   --   --   --   ?PROT 7.3 7.0  --   --  8.3*  --   --   --   ?ALBUMIN 3.0* 2.8*  --   --  3.4*  --   --   --   ?AST 11* 12*  --   --  13*  --   --   --   ?  ALT 6 7  --   --  7  --   --   --   ?ALKPHOS 92 85  --   --  96  --   --   --   ?BILITOT 0.8 0.7  --   --  1.2  --   --   --   ?GFRNONAA >60 >60   < > 59* 30* 41* 51* >60  ?ANIONGAP 7 7   < > '11 12 10 9 7  '$ ? < > = values in this interval not displayed.  ?  ?Lipids No results for input(s): CHOL, TRIG, HDL, LABVLDL, LDLCALC, CHOLHDL in the last 168 hours.  ?Hematology ?Recent Labs  ?Lab 11/04/21 ?0142 11/05/21 ?0413 11/06/21 ?3295  ?WBC 14.9* 13.1* 10.8*  ?RBC 5.34* 5.05 4.79  ?HGB 12.9 12.2 11.6*  ?HCT 41.4 38.9 38.1  ?MCV 77.5* 77.0* 79.5*  ?MCH 24.2* 24.2* 24.2*  ?MCHC 31.2 31.4 30.4  ?RDW 18.3* 18.0* 18.2*  ?PLT 425* 416* 379  ? ?Thyroid  ?Recent Labs  ?Lab 11/01/21 ?2056  ?TSH 1.730  ?  ?BNP ?Recent Labs  ?Lab 11/01/21 ?2314  ?BNP 181.7*  ?  ?DDimer No results for input(s): DDIMER in the last 168 hours.  ? ?Radiology  ?  ?No results found. ? ?Cardiac Studies  ? ?Echo from 11/02/21: ?  ? 1. Left ventricular ejection fraction, by estimation, is 20 to 25%. The  ?left ventricle has severely decreased function. The left ventricle  ?demonstrates global hypokinesis. There is mild concentric left ventricular  ?hypertrophy. Left ventricular diastolic  ? parameters are consistent with Grade III diastolic dysfunction  ?(restrictive).  ? 2. Right ventricular systolic function is normal. The right ventricular   ?size is normal. There is normal pulmonary artery systolic pressure.  ? 3. The mitral valve is normal in structure. Trivial mitral valve  ?regurgitation. No evidence of mitral stenosis.  ? 4. The aortic valve is normal in structure. Aortic valve regurgitation is  ?not visualized. No aortic stenosis is present.  ? 5. The inferior vena cava is normal in size with greater than 50%  ?respiratory variability, suggesting right atrial pressure of 3 mmHg. ?  ?  ?Cardiac MRI on 09/20/21: ?  ?IMPRESSION: ?1. Mildly dilated LV with LV EF 23%, diffuse hypokinesis with ?inferior akinesis. ?  ?2.  Normal RV size with moderate systolic dysfunction, EF 18%. ?  ?3. No myocardial LGE so no definitive evidence for prior MI, ?infiltrative disease, or myocarditis. ?  ?  ?Right and left heart cath on 09/20/21: ?  ?  Mid Cx lesion is 30% stenosed. ?  Prox RCA to Mid RCA lesion is 25% stenosed. ?  ?1. Mild, nonobstructive CAD. Nonischemic cardiomyopathy.  ?2. Elevated right and left heart filling pressures.  ?3. Preserved cardiac output.  ?4. PAPI low at 1.5.  ?5. Pulmonary venous hypertension.  ?  ?Echo from 09/18/21: ?  ? 1. Left ventricular ejection fraction, by estimation, is 20%. The left  ?ventricle has severely decreased function. The left ventricle demonstrates  ?global hypokinesis. There is mild concentric left ventricular hypertrophy.  ?Left ventricular diastolic  ?parameters are consistent with Grade III diastolic dysfunction  ?(restrictive). Elevated left ventricular end-diastolic pressure.  ? 2. Right ventricular systolic function is severely reduced. The right  ?ventricular size is normal. There is moderately elevated pulmonary artery  ?systolic pressure. The estimated right ventricular systolic pressure is  ?84.1 mmHg.  ? 3. Left atrial size was mildly dilated.  ? 4. The  mitral valve is normal in structure. Mild mitral valve  ?regurgitation. No evidence of mitral stenosis.  ? 5. The aortic valve is normal in structure. Aortic  valve regurgitation is  ?not visualized. No aortic stenosis is present.  ? 6. The inferior vena cava is dilated in size with <50% respiratory  ?variability, suggesting right atrial pressure of 15 mmHg.  ? ?Patient Profile  ?   ?46 y.o. female   hx of systolic and diastolic heart failure, NICM, nonobstructive CAD, history of LV thrombus, paroxysmal atrial fibrillation, type 2 diabetes, hypertension, hyperlipidemia, obesity, OSA, medical noncompliance, PCOS now with decompensated heart failure.  ? ?Assessment & Plan  ?  ?1.Chronic combined systolic/diastolic HF ?- 12/3147 echo LVEF 20-25%, grade III dd, normal RV function ?- noncompliant with diuretics at home, reports compliant with other meds ?- AKI this admission. Entresto, aldactone, digoxin, torsemide held. Peak Cr 2.07, down to 1.01 today. Restart torsemide '40mg'$  bid, restart digoxin 0.'125mg'$  daily, aldactone 12.'5mg'$  daily ?- soft bp's at times, hold on restarting entresto. If persistent perhaps low dose ARB tomorrow pending bp ? ?  ?From H&P Most recently home regimen, she has been back on Entresto at 97/103 twice daily, spironolactone 25 mg daily, carvedilol 6.25 mg, low-dose digoxin, Jardiance 10 mg and torsemide ?  ?  ?  ?2. PAF ?- continue beta blocker, eliquis. Restarting digoxin now that AKI has resolved ?- tele shows NSR ?  ?  ? ?  ?3. AKI ?- much improved restart torsemide '40mg'$  bid. Likely prerenal from N/V ? ?4. N/V ?- per GI ? ?For questions or updates, please contact [place] ?Please consult [email or web address] for contact info under  ? ?  ?   ?Signed, ?Carlyle Dolly, MD  ?11/07/2021, 9:12 AM   ? ?

## 2021-11-07 NOTE — Discharge Instructions (Addendum)
Dear [name], ? ?Thank you for letting us participate in your care. You were hospitalized for nausea, vomiting and dizziness. You underwent an EGD which was normal. We have started you on medications to help with your nausea and vomiting. You should follow up with GI outpatient for a gastric emptying study. In the meantime, we have prescribed medication that you can take for your nausea. You were also seen by a Cardiologist for your heart failure. There are some important medication changes below. The medications have been sent to [facility]. It is very important that you take your medications daily. We would encourage you to do outpatient vestibular rehab for your dizziness. We did an MRI in the hospital and it did not show any new changes, which is good news.  ? ?POST-HOSPITAL & CARE INSTRUCTIONS ?New medication changes:  ?Take Digoxin 0.125 mg daily.   ?Your spironolactone was started back at a smaller dose, take 12.5 mg daily.  ?You should take 40 mg of your torsemide twice a day.   ?You are also started back on Jardiance 10 mg daily.  These medications are all important for your heart.   ?Your Delene Loll was not started back again.  Please schedule an appointment with our family medicine clinic, they will do blood work and decide when to start you back on this medication.  ?For your nausea, you can continue to take your Zofran every 8 hours and you can take the Reglan as needed every 8 hours if you continue to have nausea.   ?2. Please follow-up with GI for gastric emptying study to further examine your cause of nausea. ?3. Go to your follow up appointments (listed below) ? ? ?DOCTOR'S APPOINTMENT   ?Future Appointments  ?Date Time Provider [place]  ?11/08/2021 11:10 AM ACCESS TO CARE POOL [facility]  ?11/23/2021 11:10 AM Daryel November, MD [facility]  ? ? Follow-up Information   ? ? [facility] Follow up.   ?Specialty:  Rehabilitation ?Why: please call to schedule yout apt times for outpatient physical therapy for dizziness ?Contact information: ?[place] ?[id number] mc ?[place] ?[phone] ? ?  ?  ? ? [facility]. Go on 11/08/2021.   ?Specialty: Respiratory Therapy ?Why: Please go to your scheduled appointment at 11:10.  Please arrive at least 15 minutes early. ?Contact information: ?[address] ?[phone] mc ?[place] ?[phone] ? ?  ?  ? ?  ?  ? ?  ? ? ?Take care and be well! ? ?Family Medicine Teaching Service Inpatient Team ?[place]  ?[facility]  ?[address] [place],  [address] ?([phone] ? ? ? ? ?

## 2021-11-07 NOTE — Progress Notes (Signed)
Pt A/O x 4 and NSR on monitor. OOB w/. Steady gait. Admitted for dizziness. See note. PT education provided on meds and follow up care. Pt adequate for discharge.  ?

## 2021-11-07 NOTE — Progress Notes (Signed)
Family Medicine Teaching Service ?Daily Progress Note ?Intern Pager: [phone] ? ?Patient name: [name] Medical record number: [id number] ?Date of birth: [birth year] Age: 46 y.o. Gender: female ? ?Primary Care Provider: Gifford Shave, MD ?Consultants: Cardiology, GI ?Code Status: Full  ? ?Pt Overview and Major Events to Date:  ?4/11 Admitted  ? ?Assessment and Plan: ?[name] is a 46 yo female presenting with vertigo, nausea/vomiting, and chronic chest pain, thought to be due to chest wall syndrome. PMHx significant for HFrEF, paroxysmal A-fib, MDD, HTN, T2DM, OSA, HLD, Anxiety, obesity, PCOS. ? ?Vertigo 2/2 BPPV ?Denied any complaints for me this morning, though when she got up to use the restroom after I saw her, had complaints of dizziness. Will obtain MRI brain and if unremarkable can likely d/c today.  ?- f/u MRI ?-Outpatient vestibular assessment referral ?-PT eval and treat ? ?Chronic combined systolic/diastolic HF ?Echo on 4/11 with LVEF 20-25%. LV with global hypokinesis. G3DD (restrictive). Weight 178 lbs , up 2 lbs from yesterday. Same on admission. UOP 635m yesterday ?Cr 1.01 down from 1.32 yesterday ?-Cardiology consulted, appreciate recs ?-Continue home coreg ?-Continue jardiance '10mg'$   ?- Restarted Torsemide '40mg'$  BID, digoxin 0.'125mg'$  daily ?- continue holding entresto given soft Bps, can consider lose dose ARB tomorrow ?- consider d/c today  ? ?PAF ?NSR this morning. HR 90 bpm ?-Cardiology consulted, appreciate recs ?-Continue Coreg, digoxin, Eliquis ? ?AKI ?Cr 1.01 down from 1.32 yesterday. Torsemide has been held for 3 days.  ?- resuming Torsemide '40mg'$  BID ? ?Chronic Nausea and Vomiting ?-Scheduled Zofran 4 mg q8hour, PRN reglan ?-Outpatient GI for gastric emptying study ?-Continue protonix 40 mg qd ?-Chloraseptic spray for throat irritation ?  ?Type 2 diabetes: Chronic ?AM glucose 147. Has been receiving Semglee 23 u daily. Yesterday received 2u aspart  ?Home Lantus 36 u, novolog 15u  BID, 12u before dinner  ?-Moderate SSI w/ 3 u meal coverage when >50% eaten ?- continue jardiance  ?  ?Leucocytosis ?WBC 13.1>10.8 yesterday  ?-AM CBC ?  ?Chest wall Syndrome ?-Rib belt ?-Avoid NSAIDS ?  ?Suprapubic pain ?PCOS ?Decreased Urine Output ?Renal U/S showed simple cyst ?-Continue UOP monitoring ?  ?MDD: Chronic ?-Continue hydroxyzine, zoloft, and trazodone ?  ?Hyperlipidemia: Chronic, stable ?-Continue Lipitor 40 mg ?  ?Microcytic anemia: Chronic, stable ?Iron deficiency anemia ?Hgb 11.6 down from 12.2 yest  ?- continue to monitor  ?  ?OSA ?Refuses CPAP due to claustrophobia per patient ?-Lifestyle modification ?  ?FEN/GI: Heart Healthy ?PPx: SCD, holding xarelto ?Dispo:Home today vs tomorrow pending MRI  ?  ? ?Subjective:  ?No acute events overnight. Patient denied any concerns or complaints this morning.  ? ?Objective: ?Temp:  [98.1 ?F (36.7 ?C)-98.5 ?F (36.9 ?C)] 98.2 ?F (36.8 ?C) (04/16 05956 ?Pulse Rate:  [75-85] 85 (04/16 0812) ?Resp:  [15-18] 15 (04/16 03875 ?BP: (102-113)/(56-78) 113/69 (04/16 06433 ?SpO2:  [96 %-100 %] 96 % (04/16 0812) ?Weight:  [80.7 kg] 80.7 kg (04/16 0347) ?Physical Exam: ?General: alert, sitting in bed in the dark, NAD ?Cardiovascular: RRR no murmurs  ?Respiratory: CTAB normal WOB ?Abdomen: soft, non distended  ?Extremities: warm, dry. No LE edema  ? ?Laboratory: ?Recent Labs  ?Lab 11/04/21 ?0142 11/05/21 ?0413 11/06/21 ?02951 ?WBC 14.9* 13.1* 10.8*  ?HGB 12.9 12.2 11.6*  ?HCT 41.4 38.9 38.1  ?PLT 425* 416* 379  ? ?Recent Labs  ?Lab 11/01/21 ?2056 11/02/21 ?0884104/11/23 ?1442 11/04/21 ?0142 11/05/21 ?0413 11/06/21 ?0660604/16/23 ?03016 ?NA 136 137   < > 135 132*  136 135  ?K 3.6 3.8   < > 3.6 3.4* 3.9 3.6  ?CL 108 108   < > 98 96* 103 102  ?CO2 21* 22   < > '25 26 24 26  '$ ?BUN 9 10   < > 17 20 21* 19  ?CREATININE 0.70 0.82   < > 2.07* 1.59* 1.32* 1.01*  ?CALCIUM 8.9 8.9   < > 9.5 9.0 9.2 9.0  ?PROT 7.3 7.0  --  8.3*  --   --   --   ?BILITOT 0.8 0.7  --  1.2  --   --   --    ?ALKPHOS 92 85  --  96  --   --   --   ?ALT 6 7  --  7  --   --   --   ?AST 11* 12*  --  13*  --   --   --   ?GLUCOSE 166* 245*   < > 214* 171* 145* 147*  ? < > = values in this interval not displayed.  ? ? ?Imaging/Diagnostic Tests: ?None new  ? ?Shary Key, DO ?11/07/2021, 8:39 AM ?PGY-2, [place] ?[place] Intern pager: [phone], text pages welcome  ?

## 2021-11-08 ENCOUNTER — Other Ambulatory Visit: Payer: Self-pay | Admitting: Family Medicine

## 2021-11-08 ENCOUNTER — Other Ambulatory Visit (HOSPITAL_COMMUNITY): Payer: Self-pay

## 2021-11-08 ENCOUNTER — Ambulatory Visit: Payer: Medicaid Other

## 2021-11-08 ENCOUNTER — Encounter (HOSPITAL_COMMUNITY): Payer: Self-pay | Admitting: Gastroenterology

## 2021-11-08 ENCOUNTER — Telehealth: Payer: Self-pay

## 2021-11-08 LAB — SURGICAL PATHOLOGY

## 2021-11-08 MED ORDER — TORSEMIDE 20 MG PO TABS
40.0000 mg | ORAL_TABLET | Freq: Two times a day (BID) | ORAL | 1 refills | Status: DC
  Filled 2021-11-08: qty 120, 30d supply, fill #0

## 2021-11-08 NOTE — Progress Notes (Deleted)
? ? ?  SUBJECTIVE:  ? ?CHIEF COMPLAINT / HPI: hospital f/u ? ?[name] is a 46 y.o.female with a history of HFrEF, paroxysmal A-fib, MDD, hypertension, type 2 diabetes, OSA, hyperlipidemia, anxiety, obesity, PCOS here for hospital f/u.  ? ?HFrEF: Dry weight around 80 kg, echo 20-25% EF, BP today is *** at goal. Will recheck  GDMT coreg, entresto, jardiance, spironolactone, torsemide 40 mg BID.  ? ?Referral for vestibular   ? ?Nausea/vomiting:  ? ?PERTINENT  PMH / PSH: *** ? ?OBJECTIVE:  ? ?There were no vitals taken for this visit.  ?*** ? ?ASSESSMENT/PLAN:  ? ?No problem-specific Assessment & Plan notes found for this encounter. ?  ? ? ?Gladys Damme, MD ?[place]  ? ?

## 2021-11-08 NOTE — Telephone Encounter (Signed)
Prior Auth for patients medication JARDIANCE approved by [employer] from 11/08/21 to 11/09/22.  ? ?Key: [id number] ? ?Patients pharmacy notified. ? ?

## 2021-11-08 NOTE — Telephone Encounter (Signed)
A Prior Authorization was initiated for this patients JARDIANCE through CoverMyMeds.  ? ?Key:  [id number] ? ?

## 2021-11-09 ENCOUNTER — Other Ambulatory Visit (HOSPITAL_COMMUNITY): Payer: Self-pay

## 2021-11-10 ENCOUNTER — Telehealth: Payer: Self-pay

## 2021-11-10 ENCOUNTER — Inpatient Hospital Stay: Payer: Medicaid Other | Admitting: Family Medicine

## 2021-11-10 NOTE — Patient Outreach (Signed)
Care Coordination ? ?11/10/2021 ? ?[name] ?1976/02/25 ?[id number] ? ?Transition Care Management Unsuccessful Follow-up Telephone Call ? ?Date of discharge and from where:  11/07/21  ? ?Attempts:  1st Attempt ? ?Reason for unsuccessful TCM follow-up call:  Left voice message ? ?  ?

## 2021-11-10 NOTE — Patient Instructions (Signed)
Visit Information ? ?[name]  - as a part of your Medicaid benefit, you are eligible for care management and care coordination services at no cost or copay. I was unable to reach you by phone today but would be happy to help you with your health related needs. Please feel free to call me @ [phone] ? ?A member of the Managed Medicaid care management team will reach out to you again over the next 7 days.  ? ?Mickel Fuchs, BSW, MHA ?[place]  ?High Risk Managed Medicaid Team  ?[phone]  ?

## 2021-11-11 ENCOUNTER — Other Ambulatory Visit (HOSPITAL_COMMUNITY): Payer: Self-pay

## 2021-11-11 NOTE — Progress Notes (Signed)
[name],  ?The biopsies taken from your stomach were notable for mild reactive gastropathy which is a common finding and often related to use of certain medications (usually NSAIDs), but there was no evidence of Helicobacter pylori infection. This common finding is not felt to necessarily be a cause of any particular symptom and there is no specific treatment or further evaluation recommended for this finding. ?Please follow up with me as needed for further evaluation of your chronic nausea and vomiting. ? ?

## 2021-11-19 ENCOUNTER — Other Ambulatory Visit (HOSPITAL_COMMUNITY): Payer: Self-pay

## 2021-11-22 ENCOUNTER — Encounter (HOSPITAL_COMMUNITY): Payer: Self-pay | Admitting: Cardiology

## 2021-11-23 ENCOUNTER — Ambulatory Visit: Payer: Medicaid Other | Admitting: Gastroenterology

## 2021-12-02 IMAGING — CT CT ANGIO CHEST
2 of 6 series · 18 of 36 positions shown · IV contrast (OMNIPAQUE 350)
Comparison: Prior radiograph from earlier the same day as well as
previous CT from 07/13/2020.

CLINICAL DATA: Initial evaluation for increasing chest pain,
dizziness, fatigue for 2 days.

EXAM:
CT ANGIOGRAPHY CHEST WITH CONTRAST
TECHNIQUE: Multidetector CT imaging of the chest was performed using the
standard protocol during bolus administration of intravenous
contrast. Multiplanar CT image reconstructions and MIPs were
obtained to evaluate the vascular anatomy.
CONTRAST:  100mL OMNIPAQUE IOHEXOL 350 MG/ML SOLN

[Series 5: thins · axial · 0.60mm/px · z∈[+1452,+1643]mm · 17 of 215 slices shown]
[im 12/215  lung]
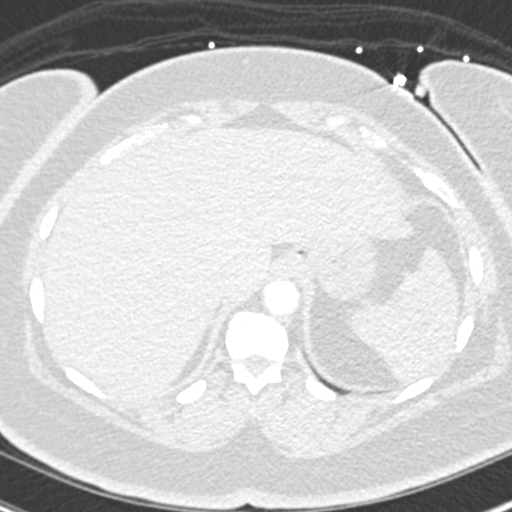
[im 24/215  mediastinal]
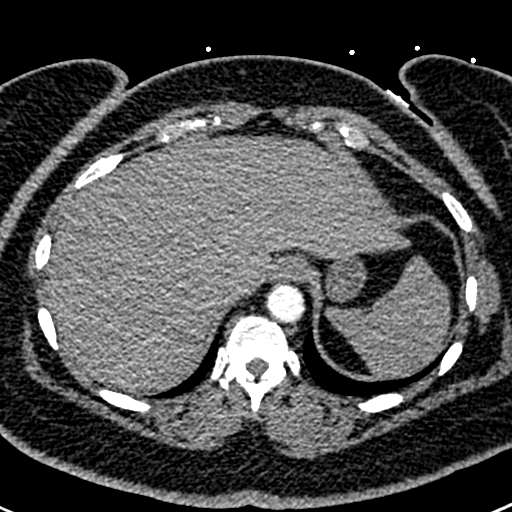
[im 36/215  lung]
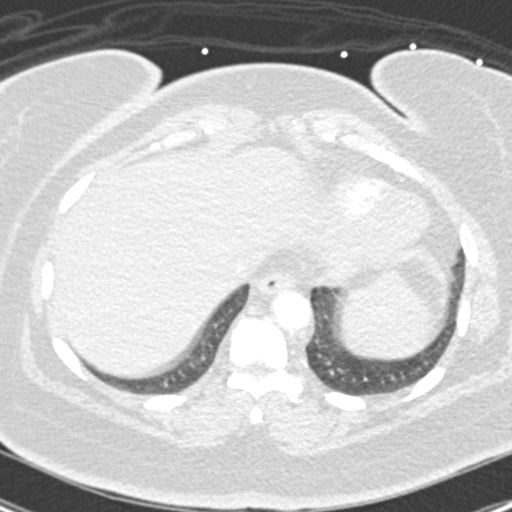
[im 48/215  mediastinal]
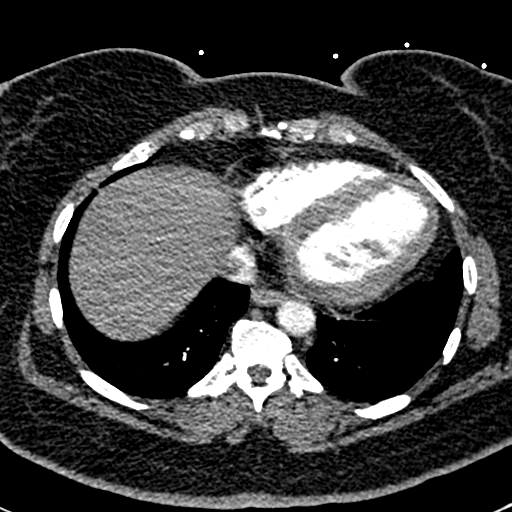
[im 60/215  lung]
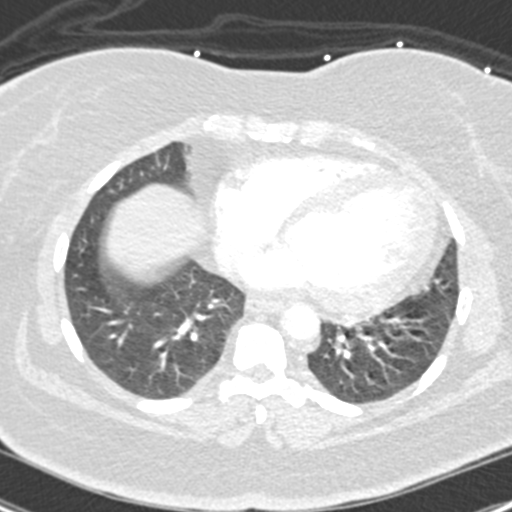
[im 72/215  mediastinal]
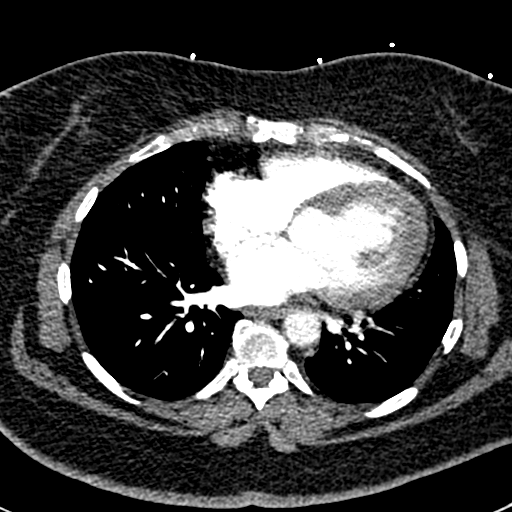
[im 84/215  lung]
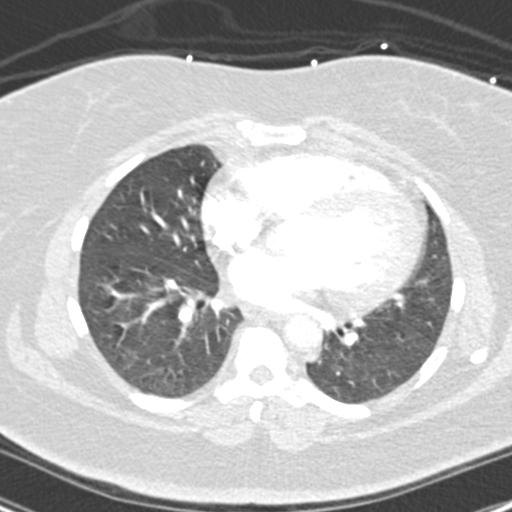
[im 96/215  mediastinal]
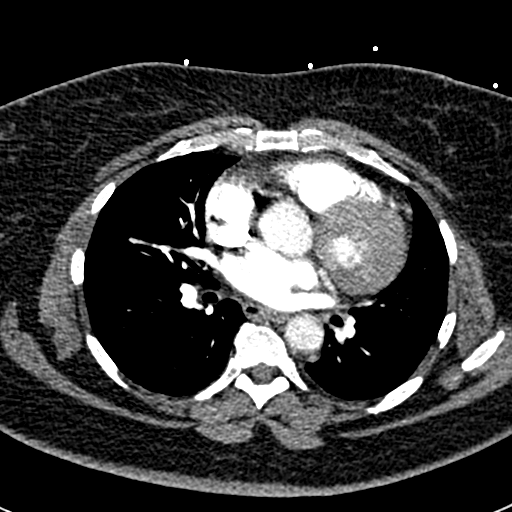
[im 108/215  lung]
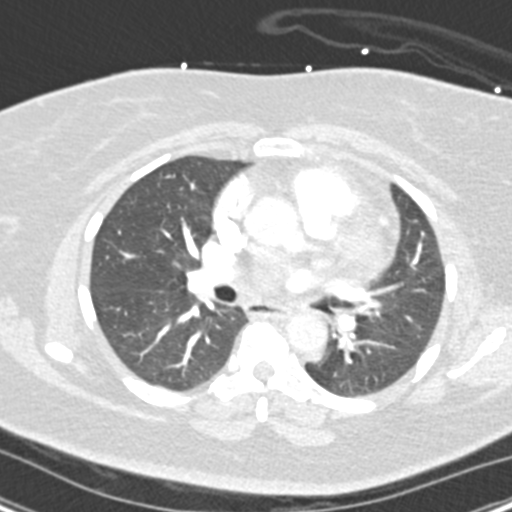
[im 119/215  mediastinal]
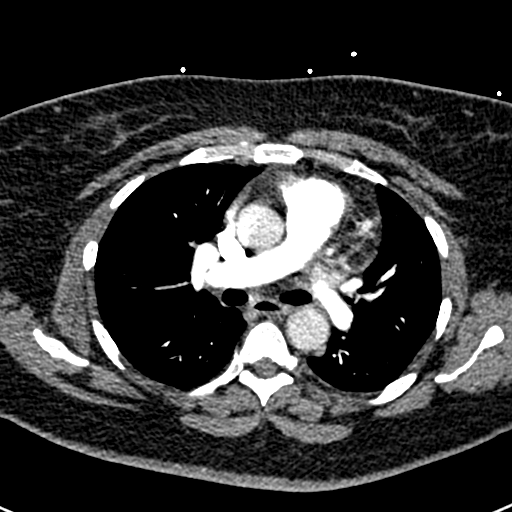
[im 131/215  lung]
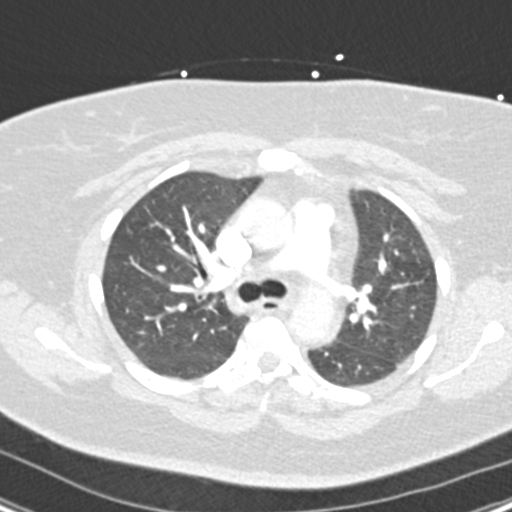
[im 143/215  mediastinal]
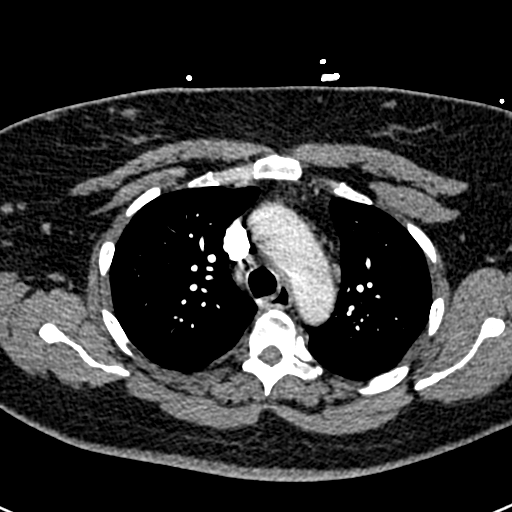
[im 155/215  lung]
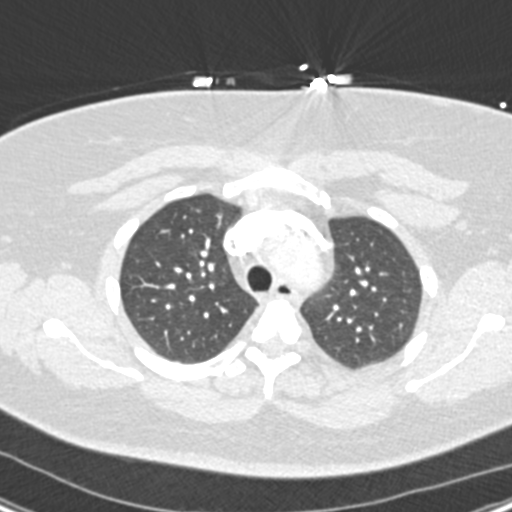
[im 167/215  mediastinal]
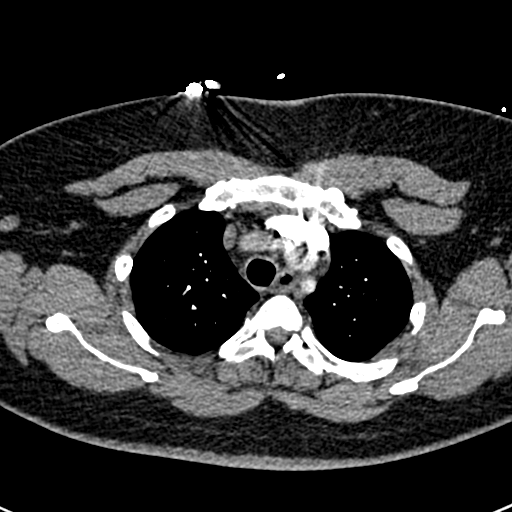
[im 179/215  lung]
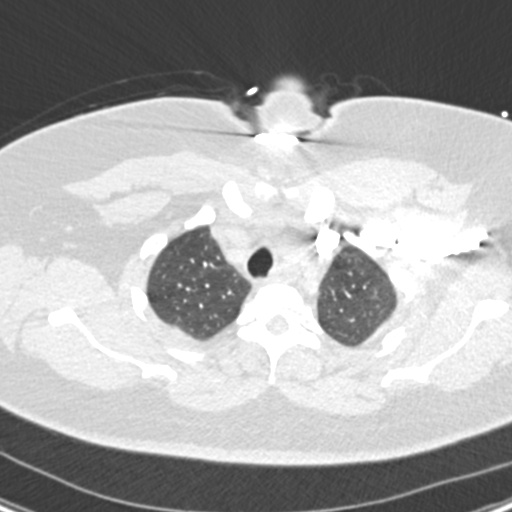
[im 191/215  mediastinal]
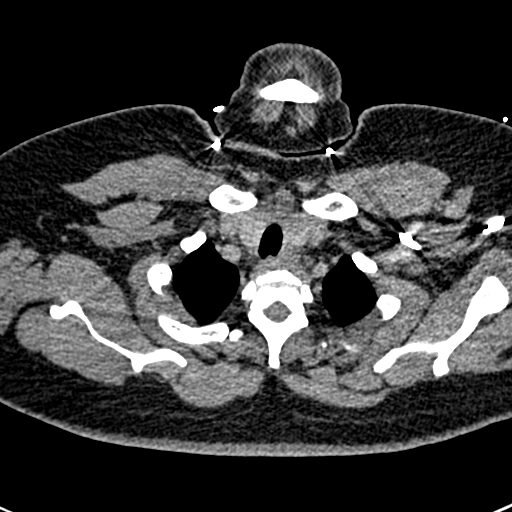
[im 203/215  lung]
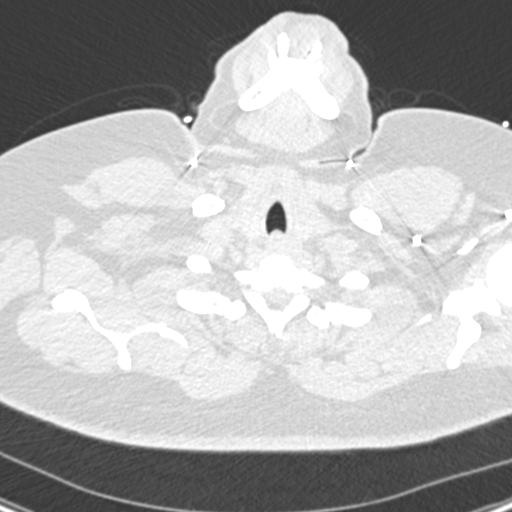

[Series 7: coronal mpr · coronal · 0.43mm/px · 1 of 127 slices shown]
[im 64/127  mediastinal]
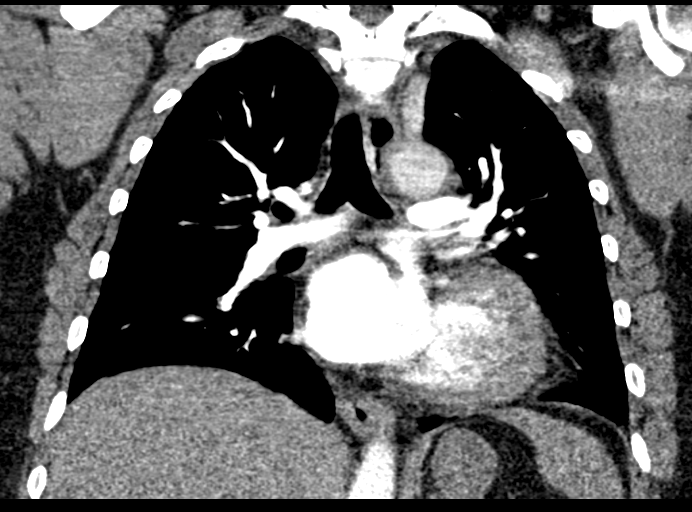

[18 of 36 positions shown; findings below may reference images not displayed]

FINDINGS: Cardiovascular: Intrathoracic aorta of normal caliber without
aneurysm. No evidence for dissection or other acute aortic pathology
allowing for timing the contrast bolus. Partially visualized great
vessels grossly within normal limits. Heart size at the upper limits
of normal. No pericardial effusion.

Pulmonary arterial tree adequately opacified for evaluation. No
filling defect to suggest acute pulmonary embolism. Re-formatted
imaging confirms these findings.

Mediastinum/Nodes: Visualized thyroid within normal limits. No
pathologically enlarged mediastinal, hilar, or axillary lymph nodes.
Esophagus within normal limits.

Lungs/Pleura: Tracheobronchial tree intact and patent. Lungs well
inflated bilaterally. Minimal linear subsegmental atelectasis
present at the left lower lobe. No focal infiltrates or
consolidative airspace disease. No pulmonary edema or pleural
effusion. No pneumothorax. No worrisome pulmonary nodule or mass.

Upper Abdomen: Visualized upper abdomen demonstrates no acute or
significant finding.

Musculoskeletal: External soft tissues demonstrate no acute finding.
No acute osseous abnormality. No discrete or worrisome osseous
lesions.

Review of the MIP images confirms the above findings.
IMPRESSION: 1. No CT evidence for acute pulmonary embolism.
2. No other acute cardiopulmonary abnormality identified.

## 2021-12-17 ENCOUNTER — Inpatient Hospital Stay (HOSPITAL_COMMUNITY)
Admission: AD | Admit: 2021-12-17 | Discharge: 2021-12-17 | Disposition: A | Discharge status: Home / Self Care | Payer: Medicaid Other | Attending: Obstetrics and Gynecology | Admitting: Obstetrics and Gynecology

## 2021-12-17 ENCOUNTER — Other Ambulatory Visit: Payer: Self-pay

## 2021-12-17 DIAGNOSIS — Z3202 Encounter for pregnancy test, result negative: Secondary | ICD-10-CM | POA: Diagnosis present

## 2021-12-17 DIAGNOSIS — I110 Hypertensive heart disease with heart failure: Secondary | ICD-10-CM | POA: Insufficient documentation

## 2021-12-17 LAB — POCT PREGNANCY, URINE: Preg Test, Ur: NEGATIVE

## 2021-12-17 LAB — HCG, QUANTITATIVE, PREGNANCY: hCG, Beta Chain, Quant, S: <1 m[IU]/mL (ref <5)

## 2021-12-17 NOTE — MAU Note (Signed)
Pt says she did HPT- positive on Mon Wed - Thurs brown spotting when she wiped  Fri- dark brown spotting - when she wipes Less when she wipes now Light mild cramps- started [employer]

## 2021-12-18 NOTE — MAU Provider Note (Signed)
History     CSN: [id number]  Arrival date and time: 12/17/21 2056   None     Chief Complaint  Patient presents with   Vaginal Bleeding   [name] 46 y.o. [id number] @ unknown gestation presents to MAU following a "faintly" positive home pregnancy test. She noticed some brown/red discharge when wiping for the last 2 days. Presents to MAU for pregnancy test. She denies frank vaginal bleeding or abdominal pain. She has no other complaints.   Vaginal Bleeding   OB History     Gravida  2   Para  2   Term  2   Preterm      AB      Living  2      SAB      IAB      Ectopic      Multiple      Live Births  2           Past Medical History:  Diagnosis Date   Abnormal Pap smear    Acute on chronic combined systolic and diastolic CHF (congestive heart failure) ([place]) 02/19/2019   Acute on chronic congestive heart failure (HCC)    Acute on chronic congestive heart failure (HCC)    Acute on chronic diastolic CHF (congestive heart failure) ([place]) 10/19/2021   Anemia    Asthma    Atrial fibrillation ([place])    history of paroxysmal atrial fibrillation   CHF exacerbation ([place]) 10/17/2021   Diabetes mellitus    DKA, type 2 ([place]) 02/06/2019   Elevated troponin 09/30/2019   Heart failure with reduced ejection fraction ([place])    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  Consider chronic etiology given continued fertility issues   Hypertension    Hypertensive emergency 09/30/2019   Major depressive disorder 02/08/2012   Reports that 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     Migraines    Morbid obesity ([place]) 02/06/2019   Nonischemic cardiomyopathy ([place]) 09/22/2021   Obstructive sleep apnea 06/30/2015   Polycystic ovarian disease     Past Surgical History:  Procedure Laterality Date   BIOPSY  11/05/2021   Procedure: BIOPSY;  Surgeon: Daryel November, MD;  Location: [facility] ENDOSCOPY;  Service: Gastroenterology;;   CERVIX LESION DESTRUCTION     CESAREAN SECTION     ESOPHAGOGASTRODUODENOSCOPY (EGD) WITH PROPOFOL N/A 11/05/2021   Procedure: ESOPHAGOGASTRODUODENOSCOPY (EGD) WITH PROPOFOL;  Surgeon: Daryel November, MD;  Location: [facility];  Service: Gastroenterology;  Laterality: N/A;   RIGHT/LEFT HEART CATH AND CORONARY ANGIOGRAPHY N/A 03/29/2019   Procedure: RIGHT/LEFT HEART CATH AND CORONARY ANGIOGRAPHY;  Surgeon: Larey Dresser, MD;  Location: [facility] CV LAB;  Service: Cardiovascular;  Laterality: N/A;   RIGHT/LEFT HEART CATH AND CORONARY ANGIOGRAPHY N/A 09/20/2021   Procedure: RIGHT/LEFT HEART CATH AND CORONARY ANGIOGRAPHY;  Surgeon: Larey Dresser, MD;  Location: [place] CV LAB;  Service: Cardiovascular;  Laterality: N/A;    Family History  Problem Relation Age of Onset   Diabetes Mother    Hypertension Mother    Hypertension Father    Diabetes Father     Social History   Tobacco Use   Smoking status: Never   Smokeless tobacco: Never   Tobacco comments:    Never smoke 10/22/21  Vaping Use   Vaping Use: Never used  Substance Use  Topics   Alcohol use: No    Alcohol/week: 0.0 standard drinks   Drug use: No    Allergies:  Allergies  Allergen Reactions   Shellfish Allergy Swelling    Airway involvement including EMS - Has Epi-Pen   Metformin And Related Diarrhea   Trulicity [Dulaglutide] Nausea And Vomiting and Other (See Comments)    Multiple episodes of vomiting over multiple days   Ibuprofen Other (See Comments)    Per PCP interferes with daily meds (heart failure)    No medications prior to admission.    Review of Systems  Genitourinary:  Positive for vaginal bleeding.  All other systems reviewed and are negative. Physical Exam   Blood pressure (!) 139/113, pulse (!) 109, temperature 98.5 F (36.9 C), temperature source Oral, resp. rate 18, height '4\' 11"'$  (1.499 m), weight 84.1 kg, last  menstrual period 11/26/2021. Pt states she has not taken her HS dose of HTN meds.  Physical Exam Vitals and nursing note reviewed.  HENT:     Head: Normocephalic.  Cardiovascular:     Rate and Rhythm: Normal rate.  Pulmonary:     Effort: Pulmonary effort is normal.  Musculoskeletal:     Cervical back: Normal range of motion.  Skin:    General: Skin is warm and dry.  Neurological:     Mental Status: She is alert.    MAU Course  Procedures Lab Orders         Urinalysis, Routine w reflex microscopic         hCG, quantitative, pregnancy         Pregnancy, urine POC    Results for orders placed or performed during the hospital encounter of 12/17/21 (from the past 24 hour(s))  Pregnancy, urine POC     Status: None   Collection Time: 12/17/21 10:16 PM  Result Value Ref Range   Preg Test, Ur NEGATIVE NEGATIVE  hCG, quantitative, pregnancy     Status: None   Collection Time: 12/17/21 10:35 PM  Result Value Ref Range   hCG, Beta Chain, Quant, S <1 <5 mIU/mL   MDM UPT negative in MAU. Confirmed by 2 RNs and Robyne Askew NP.   Assessment and Plan   1. Pregnancy test negative   - Offered patient to wait for results or go home and await results. Patient desires to go home.  - HTN- Follow up with PCP management.  - Confirmed MyChart use with patient.  - Patient Discharged home in stable condition.   [name], CNM  12/18/2021, 12:49 AM

## 2021-12-28 ENCOUNTER — Encounter: Payer: Self-pay | Admitting: *Deleted

## 2022-01-03 NOTE — Progress Notes (Deleted)
    SUBJECTIVE:   CHIEF COMPLAINT / HPI:   ***  PERTINENT  PMH / PSH: ***  OBJECTIVE:   LMP 11/26/2021   ***  ASSESSMENT/PLAN:   No problem-specific Assessment & Plan notes found for this encounter.     Alcus Dad, MD [place]

## 2022-01-04 ENCOUNTER — Ambulatory Visit: Payer: Medicaid Other | Admitting: Family Medicine

## 2022-01-19 ENCOUNTER — Other Ambulatory Visit (HOSPITAL_COMMUNITY): Payer: Self-pay

## 2022-01-19 ENCOUNTER — Other Ambulatory Visit: Payer: Self-pay

## 2022-01-19 ENCOUNTER — Encounter: Payer: Self-pay | Admitting: Family Medicine

## 2022-01-19 ENCOUNTER — Ambulatory Visit (INDEPENDENT_AMBULATORY_CARE_PROVIDER_SITE_OTHER): Payer: Medicaid Other | Admitting: Family Medicine

## 2022-01-19 DIAGNOSIS — F0630 Mood disorder due to known physiological condition, unspecified: Secondary | ICD-10-CM

## 2022-01-19 DIAGNOSIS — I5042 Chronic combined systolic (congestive) and diastolic (congestive) heart failure: Secondary | ICD-10-CM | POA: Diagnosis not present

## 2022-01-19 MED ORDER — SERTRALINE HCL 50 MG PO TABS
50.0000 mg | ORAL_TABLET | Freq: Every day | ORAL | 0 refills | Status: DC
  Filled 2022-01-19 – 2022-01-21 (×2): qty 90, 90d supply, fill #0

## 2022-01-19 NOTE — Assessment & Plan Note (Signed)
PHQ-9 of 15 today.  Ports that she is extremely exhausted from her chronic conditions and does not wish to continue any management of these chronic conditions.  I discussed the idea of palliative care with the patient and ask if she would be interested in speaking with them given her current thought process.  I do believe that the patient has capacity and understands the risk of discontinuing any treatments and feel that this may be appropriate.  Patient declines a palliative referral at this time.  She would like medications for her depression and she currently has hydroxyzine as well as trazodone for sleep.  She was prescribed Zoloft but did not notice any difference in how she felt so stopped taking it and when she was taking it she was taking it intermittently.  Sent prescription for Zoloft to patient's pharmacy 50 mg daily which she needs to take regularly and we will follow-up in 3 weeks.  Continue trazodone and hydroxyzine as needed.  If she desires a palliative care consult we will place one for her.

## 2022-01-19 NOTE — Patient Instructions (Addendum)
Full seeing you today!  I am sorry you are having such a tough time.  For your depression I really want you to take your Zoloft (sertraline) daily.  I refilled your prescription for this and would like for you to be seen in our clinic in 3 weeks.  For your heart failure issues I am going to send a message to the heart failure team and probably place another referral so that they can get you back in with their clinic.  If this does not occur please let our clinic know.  If you have any questions or concerns or issues please call the clinic.  If you have any chest pain, shortness of breath please be evaluated in the emergency department.  I hope you have a wonderful rest of your day.

## 2022-01-19 NOTE — Progress Notes (Signed)
SUBJECTIVE:   CHIEF COMPLAINT / HPI:   Depression concerns Patient is extremely depressed at this time.  She reports that she has not been doing well since her last hospitalization.  She has been intermittently compliant with her medications and reports that for her depression the hydroxyzine helps greatly but she has not been taking the medication that she was started on while in the hospital which I believe is Zoloft.  Denies any active SI but reports she wants to stop taking all of her medications and live as long as she can but not do any intervention to help prolong her life.  Heart failure needs Patient reports that she saw heart failure in the hospital but that she was supposed to follow-up with them after discharge but when she called to schedule an appointment to help was told that she was no longer a patient at the heart failure clinic and so she has not seen any cardiologist since then.  Reports intermittent episodes of her heart racing.  Has not been extremely compliant with her heart failure medications although she does sometimes take them.   PERTINENT  PMH / PSH: HFrEF, severe depression  OBJECTIVE:   BP (!) 156/114   Pulse (!) 113   Wt 184 lb 12.8 oz (83.8 kg)   SpO2 100%   BMI 37.33 kg/m   General: Intermittently tearful, depressed 46 year old female Cardiac: Regular rate on my evaluation although it was elevated when she initially presented.  No murmurs appreciated Respiratory: Normal work of breathing, speaking in full sentences MSK: No gross abnormalities Psych: Extremely depressed reporting passive SI, no active SI, is adamant that she has no plan  ASSESSMENT/PLAN:   Chronic combined systolic and diastolic heart failure (HCC) Heart failure appears to be stable at this time.  She has poor compliance with her medications and does not think she wants to continue taking any of her heart failure medications at this time.  Messaged heart failure team during this  visit and reports that she is no longer a patient at that clinic and will need to follow-up with a general cardiologist.  She does report she has had intermittent episodes of tachycardia otherwise doing okay from a heart failure standpoint.  We will determine need for referral to cardiology and place referral if necessary.  Mood disorder due to a general medical condition PHQ-9 of 15 today.  Ports that she is extremely exhausted from her chronic conditions and does not wish to continue any management of these chronic conditions.  I discussed the idea of palliative care with the patient and ask if she would be interested in speaking with them given her current thought process.  I do believe that the patient has capacity and understands the risk of discontinuing any treatments and feel that this may be appropriate.  Patient declines a palliative referral at this time.  She would like medications for her depression and she currently has hydroxyzine as well as trazodone for sleep.  She was prescribed Zoloft but did not notice any difference in how she felt so stopped taking it and when she was taking it she was taking it intermittently.  Sent prescription for Zoloft to patient's pharmacy 50 mg daily which she needs to take regularly and we will follow-up in 3 weeks.  Continue trazodone and hydroxyzine as needed.  If she desires a palliative care consult we will place one for her.     Concepcion Living, MD [place]

## 2022-01-19 NOTE — Assessment & Plan Note (Addendum)
Heart failure appears to be stable at this time.  She has poor compliance with her medications and does not think she wants to continue taking any of her heart failure medications at this time.  Messaged heart failure team during this visit and reports that she is no longer a patient at that clinic and will need to follow-up with a general cardiologist.  She does report she has had intermittent episodes of tachycardia otherwise doing okay from a heart failure standpoint.  We will determine need for referral to cardiology and place referral if necessary.

## 2022-01-20 NOTE — Addendum Note (Signed)
Addended by: Concepcion Living on: 01/20/2022 08:15 AM   Modules accepted: Orders

## 2022-01-21 ENCOUNTER — Other Ambulatory Visit (HOSPITAL_COMMUNITY): Payer: Self-pay

## 2022-01-28 ENCOUNTER — Encounter (HOSPITAL_COMMUNITY): Payer: Self-pay | Admitting: Emergency Medicine

## 2022-01-28 ENCOUNTER — Observation Stay (HOSPITAL_COMMUNITY)
Admission: EM | Admit: 2022-01-28 | Discharge: 2022-01-29 | Discharge status: Against Medical Advice | Payer: Medicaid Other | Attending: Family Medicine | Admitting: Family Medicine

## 2022-01-28 ENCOUNTER — Emergency Department (HOSPITAL_COMMUNITY): Payer: Medicaid Other

## 2022-01-28 ENCOUNTER — Other Ambulatory Visit: Payer: Self-pay

## 2022-01-28 DIAGNOSIS — R0602 Shortness of breath: Secondary | ICD-10-CM | POA: Diagnosis present

## 2022-01-28 DIAGNOSIS — I5043 Acute on chronic combined systolic (congestive) and diastolic (congestive) heart failure: Secondary | ICD-10-CM | POA: Insufficient documentation

## 2022-01-28 DIAGNOSIS — E1165 Type 2 diabetes mellitus with hyperglycemia: Secondary | ICD-10-CM | POA: Diagnosis present

## 2022-01-28 DIAGNOSIS — I152 Hypertension secondary to endocrine disorders: Secondary | ICD-10-CM | POA: Diagnosis present

## 2022-01-28 DIAGNOSIS — E1159 Type 2 diabetes mellitus with other circulatory complications: Secondary | ICD-10-CM | POA: Diagnosis not present

## 2022-01-28 DIAGNOSIS — I10 Essential (primary) hypertension: Secondary | ICD-10-CM | POA: Diagnosis not present

## 2022-01-28 DIAGNOSIS — I509 Heart failure, unspecified: Secondary | ICD-10-CM

## 2022-01-28 DIAGNOSIS — Z79899 Other long term (current) drug therapy: Secondary | ICD-10-CM | POA: Diagnosis not present

## 2022-01-28 DIAGNOSIS — Z794 Long term (current) use of insulin: Secondary | ICD-10-CM | POA: Insufficient documentation

## 2022-01-28 DIAGNOSIS — F329 Major depressive disorder, single episode, unspecified: Secondary | ICD-10-CM | POA: Diagnosis present

## 2022-01-28 DIAGNOSIS — I480 Paroxysmal atrial fibrillation: Secondary | ICD-10-CM | POA: Diagnosis not present

## 2022-01-28 DIAGNOSIS — R739 Hyperglycemia, unspecified: Secondary | ICD-10-CM | POA: Diagnosis not present

## 2022-01-28 DIAGNOSIS — I5023 Acute on chronic systolic (congestive) heart failure: Secondary | ICD-10-CM

## 2022-01-28 DIAGNOSIS — Z7901 Long term (current) use of anticoagulants: Secondary | ICD-10-CM | POA: Diagnosis not present

## 2022-01-28 DIAGNOSIS — R0689 Other abnormalities of breathing: Secondary | ICD-10-CM | POA: Diagnosis not present

## 2022-01-28 DIAGNOSIS — I110 Hypertensive heart disease with heart failure: Secondary | ICD-10-CM | POA: Diagnosis not present

## 2022-01-28 DIAGNOSIS — I959 Hypotension, unspecified: Secondary | ICD-10-CM | POA: Diagnosis not present

## 2022-01-28 LAB — COMPREHENSIVE METABOLIC PANEL
ALT: 17 U/L (ref 0–44)
AST: 21 U/L (ref 15–41)
Albumin: 2.9 g/dL — ABNORMAL LOW (ref 3.5–5.0)
Alkaline Phosphatase: 101 U/L (ref 38–126)
Anion gap: 13 (ref 5–15)
BUN: 10 mg/dL (ref 6–20)
CO2: 19 mmol/L — ABNORMAL LOW (ref 22–32)
Calcium: 8.8 mg/dL — ABNORMAL LOW (ref 8.9–10.3)
Chloride: 101 mmol/L (ref 98–111)
Creatinine, Ser: 0.84 mg/dL (ref 0.44–1.00)
GFR, Estimated: >60 mL/min (ref >60)
Glucose, Bld: 367 mg/dL — ABNORMAL HIGH (ref 70–99)
Potassium: 3.8 mmol/L (ref 3.5–5.1)
Sodium: 133 mmol/L — ABNORMAL LOW (ref 135–145)
Total Bilirubin: 0.8 mg/dL (ref 0.3–1.2)
Total Protein: 7 g/dL (ref 6.5–8.1)

## 2022-01-28 LAB — CBC
HCT: 36.6 % (ref 36.0–46.0)
Hemoglobin: 11.7 g/dL — ABNORMAL LOW (ref 12.0–15.0)
MCH: 25.1 pg — ABNORMAL LOW (ref 26.0–34.0)
MCHC: 32 g/dL (ref 30.0–36.0)
MCV: 78.4 fL — ABNORMAL LOW (ref 80.0–100.0)
Platelets: 304 10*3/uL (ref 150–400)
RBC: 4.67 MIL/uL (ref 3.87–5.11)
RDW: 14.3 % (ref 11.5–15.5)
WBC: 11.2 10*3/uL — ABNORMAL HIGH (ref 4.0–10.5)
nRBC: 0 % (ref 0.0–0.2)

## 2022-01-28 LAB — I-STAT BETA HCG BLOOD, ED (MC, WL, AP ONLY): I-stat hCG, quantitative: <5 m[IU]/mL (ref <5)

## 2022-01-28 LAB — BRAIN NATRIURETIC PEPTIDE: B Natriuretic Peptide: 296.9 pg/mL — ABNORMAL HIGH (ref 0.0–100.0)

## 2022-01-28 LAB — MAGNESIUM: Magnesium: 1.8 mg/dL (ref 1.7–2.4)

## 2022-01-28 NOTE — ED Notes (Signed)
Pt ambulated to restroom with stand by assist.  

## 2022-01-28 NOTE — ED Triage Notes (Signed)
Pt BIB [facility] from home, c/o increased shortness of breath at rest and exertion, weight gain, and difficulty lying flat. EMS VS: BP 124/72, HR 90, Spo2 100% room air. Hx CHF, asthma.

## 2022-01-28 NOTE — ED Provider Triage Note (Signed)
Emergency Medicine Provider Triage Evaluation Note  [name] , a 46 y.o. female  was evaluated in triage.  Pt complains of shortness of breath.  Brought in by EMS.  History of CHF.  She has not been sleeping well because of shortness of breath and an inability to lay down.  She has a moderate weight gain.  Worsening exertional dyspnea..  Review of Systems  Positive: Shortness of breath Negative: Chest pain  Physical Exam  There were no vitals taken for this visit. Gen:   Awake, no distress   Resp:  Pursed lip breathing, Rales at the bases MSK:   Moves extremities without difficulty  Other:  Trace edema  Medical Decision Making  Medically screening exam initiated at 8:29 PM.  Appropriate orders placed.  [name] was informed that the remainder of the evaluation will be completed by another provider, this initial triage assessment does not replace that evaluation, and the importance of remaining in the ED until their evaluation is complete.  Work-up initiated   Margarita Mail, PA-C 01/28/22 2032

## 2022-01-29 ENCOUNTER — Telehealth: Payer: Self-pay | Admitting: Family Medicine

## 2022-01-29 DIAGNOSIS — I152 Hypertension secondary to endocrine disorders: Secondary | ICD-10-CM | POA: Diagnosis not present

## 2022-01-29 DIAGNOSIS — F332 Major depressive disorder, recurrent severe without psychotic features: Secondary | ICD-10-CM | POA: Diagnosis not present

## 2022-01-29 DIAGNOSIS — I509 Heart failure, unspecified: Secondary | ICD-10-CM

## 2022-01-29 DIAGNOSIS — E1165 Type 2 diabetes mellitus with hyperglycemia: Secondary | ICD-10-CM

## 2022-01-29 DIAGNOSIS — I5041 Acute combined systolic (congestive) and diastolic (congestive) heart failure: Secondary | ICD-10-CM

## 2022-01-29 DIAGNOSIS — E1159 Type 2 diabetes mellitus with other circulatory complications: Secondary | ICD-10-CM | POA: Diagnosis not present

## 2022-01-29 DIAGNOSIS — R0602 Shortness of breath: Secondary | ICD-10-CM

## 2022-01-29 HISTORY — DX: Heart failure, unspecified: I50.9

## 2022-01-29 LAB — DIGOXIN LEVEL: Digoxin Level: <0.2 ng/mL — ABNORMAL LOW (ref 0.8–2.0)

## 2022-01-29 LAB — HEMOGLOBIN A1C
Hgb A1c MFr Bld: 12.1 % — ABNORMAL HIGH (ref 4.8–5.6)
Mean Plasma Glucose: 300.57 mg/dL

## 2022-01-29 LAB — CBG MONITORING, ED: Glucose-Capillary: 338 mg/dL — ABNORMAL HIGH (ref 70–99)

## 2022-01-29 MED ORDER — INSULIN ASPART 100 UNIT/ML IJ SOLN
0.0000 [IU] | Freq: Three times a day (TID) | INTRAMUSCULAR | Status: DC
  Administered 2022-01-29: 15 [IU] via SUBCUTANEOUS

## 2022-01-29 MED ORDER — SPIRONOLACTONE 12.5 MG HALF TABLET: 12.5000 mg | ORAL_TABLET | Freq: Every day | ORAL | Status: DC

## 2022-01-29 MED ORDER — FUROSEMIDE 10 MG/ML IJ SOLN: 80.0000 mg | Freq: Once | INTRAMUSCULAR | Status: DC

## 2022-01-29 MED ORDER — FUROSEMIDE 10 MG/ML IJ SOLN
80.0000 mg | Freq: Once | INTRAMUSCULAR | Status: AC
  Administered 2022-01-29: 80 mg via INTRAVENOUS
  Filled 2022-01-29: qty 8

## 2022-01-29 MED ORDER — ACETAMINOPHEN 325 MG PO TABS
650.0000 mg | ORAL_TABLET | Freq: Four times a day (QID) | ORAL | Status: DC | PRN
  Filled 2022-01-29: qty 2

## 2022-01-29 MED ORDER — CARVEDILOL 3.125 MG PO TABS
6.2500 mg | ORAL_TABLET | Freq: Two times a day (BID) | ORAL | Status: DC
  Administered 2022-01-29: 6.25 mg via ORAL
  Filled 2022-01-29: qty 2

## 2022-01-29 MED ORDER — OXYCODONE-ACETAMINOPHEN 5-325 MG PO TABS
1.0000 | ORAL_TABLET | Freq: Once | ORAL | Status: AC
  Administered 2022-01-29: 1 via ORAL
  Filled 2022-01-29: qty 1

## 2022-01-29 MED ORDER — ONDANSETRON HCL 4 MG/2ML IJ SOLN
4.0000 mg | Freq: Once | INTRAMUSCULAR | Status: AC
Start: 2022-01-29 — End: 2022-01-29
  Administered 2022-01-29: 4 mg via INTRAVENOUS
  Filled 2022-01-29: qty 2

## 2022-01-29 MED ORDER — SERTRALINE HCL 50 MG PO TABS
50.0000 mg | ORAL_TABLET | Freq: Every day | ORAL | Status: DC
  Filled 2022-01-29: qty 1

## 2022-01-29 MED ORDER — RIVAROXABAN 20 MG PO TABS: 20.0000 mg | ORAL_TABLET | Freq: Every day | ORAL | Status: DC

## 2022-01-29 MED ORDER — EMPAGLIFLOZIN 10 MG PO TABS: 10.0000 mg | ORAL_TABLET | Freq: Every day | ORAL | Status: DC

## 2022-01-29 MED ORDER — DIGOXIN 125 MCG PO TABS: 0.1250 mg | ORAL_TABLET | Freq: Every day | ORAL | Status: DC

## 2022-01-29 MED ORDER — HYDROXYZINE HCL 10 MG PO TABS: 10.0000 mg | ORAL_TABLET | Freq: Three times a day (TID) | ORAL | Status: DC | PRN

## 2022-01-29 MED ORDER — TRAZODONE HCL 50 MG PO TABS: 50.0000 mg | ORAL_TABLET | Freq: Every day | ORAL | Status: DC

## 2022-01-29 MED ORDER — ATORVASTATIN CALCIUM 40 MG PO TABS: 40.0000 mg | ORAL_TABLET | Freq: Every day | ORAL | Status: DC

## 2022-01-29 MED ORDER — INSULIN GLARGINE-YFGN 100 UNIT/ML ~~LOC~~ SOLN: 25.0000 [IU] | Freq: Every day | SUBCUTANEOUS | Status: DC

## 2022-01-29 MED ORDER — SODIUM CHLORIDE 0.9 % IV SOLN: 12.5000 mg | Freq: Four times a day (QID) | INTRAVENOUS | Status: DC | PRN

## 2022-01-29 MED ORDER — ACETAMINOPHEN 650 MG RE SUPP: 650.0000 mg | Freq: Four times a day (QID) | RECTAL | Status: DC | PRN

## 2022-01-29 MED ORDER — INSULIN ASPART 100 UNIT/ML IJ SOLN: 0.0000 [IU] | Freq: Every day | INTRAMUSCULAR | Status: DC

## 2022-01-29 MED ORDER — ALBUTEROL SULFATE (2.5 MG/3ML) 0.083% IN NEBU
2.5000 mg | INHALATION_SOLUTION | Freq: Four times a day (QID) | RESPIRATORY_TRACT | Status: DC | PRN
Start: 2022-01-29 — End: 2022-01-29

## 2022-01-29 NOTE — ED Provider Notes (Addendum)
[facility] EMERGENCY DEPARTMENT Provider Note   CSN: [id number] Arrival date & time: 01/28/22  2028   History  Chief Complaint  Patient presents with   Shortness of Breath    JAMYAH FOLK is a 46 y.o. female.  46 year old female with medical history of pretty severe congestive heart failure who presents the ER today with worsening dyspnea on exertion associated with orthopnea.  Patient states that she has been having worsening symptoms over the last couple weeks but really over the last few months she is had issues with diffuse pain and shortness of breath.  Patient states that she is getting more more depressed about it.  She specifically says that she is not suicidal however she states that she is tired of dealing with all the symptoms that she has and is unsure as to why she was cursed with all of these issues.  No fever or cough.  Does have similar extremity swelling.  States compliance with medications most the time but not always.  She does allude to possibly stopping taking all her medicine however she has not planned to do it now.    Shortness of Breath   Home Medications Prior to Admission medications   Medication Sig Start Date End Date Taking? Authorizing Provider  Accu-Chek Softclix Lancets lancets Use as instructed 02/12/19   Caroline More, DO  albuterol (VENTOLIN HFA) 108 (90 Base) MCG/ACT inhaler Inhale 2 puffs into the lungs every 6 (six) hours as needed for wheezing or shortness of breath. Patient not taking: Reported on 11/02/2021 09/16/21 09/16/22  Rise Patience, DO  atorvastatin (LIPITOR) 40 MG tablet TAKE 1 TABLET BY MOUTH DAILY AT 6 PM. 04/01/20 08/27/22  Larey Dresser, MD  blood glucose meter kit and supplies KIT Dispense based on patient and insurance preference. Use up to four times daily as directed. (FOR ICD-9 250.00, 250.01). 09/27/21   Autry-Lott, Naaman Plummer, DO  Blood Glucose Monitoring Suppl (ACCU-CHEK AVIVA PLUS) w/Device KIT 1 application by  Does not apply route QID. Check fasting sugars as well as throughout the day 02/12/19   Caroline More, DO  carvedilol (COREG) 6.25 MG tablet Take 1 tablet (6.25 mg total) by mouth 2 (two) times daily with a meal. 09/22/21   Gladys Damme, MD  digoxin (LANOXIN) 0.125 MG tablet Take 1 tablet (0.125 mg total) by mouth daily. 11/08/21   Sharion Settler, DO  empagliflozin (JARDIANCE) 10 MG TABS tablet Take 1 tablet (10 mg total) by mouth daily. 11/08/21   Sharion Settler, DO  glucose blood (ACCU-CHEK AVIVA PLUS) test strip Check CBG 2 times per day 08/16/19   [name], DO  glucose blood (AGAMATRIX PRESTO TEST) test strip Use as instructed 10/23/17   Zenia Resides, MD  hydrOXYzine (ATARAX) 10 MG tablet Take 1 tablet (10 mg total) by mouth 3 (three) times daily as needed. Patient taking differently: Take 10 mg by mouth 3 (three) times daily as needed for anxiety. 10/22/21   Gladys Damme, MD  insulin aspart (NOVOLOG FLEXPEN) 100 UNIT/ML FlexPen Inject 12 Units into the skin daily before supper. 09/02/21   Zenia Resides, MD  insulin glargine (LANTUS) 100 UNIT/ML Solostar Pen Inject 36 Units into the skin every morning. 09/02/21   Zenia Resides, MD  Insulin Pen Needle (UNIFINE PENTIPS) 31G X 5 MM MISC USE AS DIRECTED 2 TIMES DAILY 09/02/21   Zenia Resides, MD  Insulin Syringe-Needle U-100 (INSULIN SYRINGE .3CC/31GX5/16") 31G X 5/16" 0.3 ML MISC 15 Units by  Does not apply route 2 (two) times daily. 09/05/20   Rai, Vernelle Emerald, MD  Lancets Misc. (ACCU-CHEK SOFTCLIX LANCET DEV) KIT 1 application by Does not apply route 2 (two) times daily. 08/16/19   [name], DO  metoCLOPramide (REGLAN) 5 MG tablet Take 1 tablet (5 mg total) by mouth every 8 (eight) hours as needed for nausea. 11/05/21 11/05/22  Erskine Emery, MD  rivaroxaban (XARELTO) 20 MG TABS tablet Take 1 tablet (20 mg total) by mouth daily with supper. 10/19/21   Wells Guiles, DO  sertraline (ZOLOFT) 50 MG tablet Take 1 tablet (50 mg  total) by mouth daily. 01/19/22   Concepcion Living, MD  spironolactone (ALDACTONE) 25 MG tablet Take 0.5 tablets (12.5 mg total) by mouth daily. 11/08/21   Sharion Settler, DO  torsemide (DEMADEX) 20 MG tablet Take 2 tablets (40 mg total) by mouth 2 (two) times daily. 11/08/21   Sharion Settler, DO  traZODone (DESYREL) 50 MG tablet Take 1 tablet (50 mg total) by mouth at bedtime. 10/22/21   Gladys Damme, MD      Allergies    Shellfish allergy, Metformin and related, Trulicity [dulaglutide], and Ibuprofen    Review of Systems   Review of Systems  Respiratory:  Positive for shortness of breath.     Physical Exam Updated Vital Signs BP (!) 140/105 (BP Location: Right Arm)   Pulse 95   Temp (!) 97.5 F (36.4 C) (Oral)   Resp (!) 26   SpO2 100%  Physical Exam Vitals and nursing note reviewed.  Constitutional:      Appearance: She is well-developed.     Comments: On arrival I watched the patient ambulate to the bathroom.  She was very weak and had to stop multiple times to catch her breath.  HENT:     Head: Normocephalic and atraumatic.  Cardiovascular:     Rate and Rhythm: Normal rate and regular rhythm.  Pulmonary:     Effort: Tachypnea present. No respiratory distress.     Breath sounds: No stridor. Rales present.  Abdominal:     General: There is no distension.  Musculoskeletal:        General: Normal range of motion.     Cervical back: Normal range of motion.     Right lower leg: Edema present.     Left lower leg: Edema present.  Skin:    General: Skin is warm and dry.  Neurological:     General: No focal deficit present.     Mental Status: She is alert.     ED Results / Procedures / Treatments   Labs (all labs ordered are listed, but only abnormal results are displayed) Labs Reviewed  BRAIN NATRIURETIC PEPTIDE - Abnormal; Notable for the following components:      Result Value   B Natriuretic Peptide 296.9 (*)    All other components within normal  limits  CBC - Abnormal; Notable for the following components:   WBC 11.2 (*)    Hemoglobin 11.7 (*)    MCV 78.4 (*)    MCH 25.1 (*)    All other components within normal limits  COMPREHENSIVE METABOLIC PANEL - Abnormal; Notable for the following components:   Sodium 133 (*)    CO2 19 (*)    Glucose, Bld 367 (*)    Calcium 8.8 (*)    Albumin 2.9 (*)    All other components within normal limits  DIGOXIN LEVEL - Abnormal; Notable for the following components:  Digoxin Level <0.2 (*)    All other components within normal limits  MAGNESIUM  I-STAT BETA HCG BLOOD, ED ([facility], [facility], [facility] ONLY)    EKG EKG Interpretation  Date/Time:  Friday January 28 2022 20:33:26 EDT Ventricular Rate:  102 PR Interval:  148 QRS Duration: 82 QT Interval:  306 QTC Calculation: 398 R Axis:   -50 Text Interpretation: Sinus tachycardia Left axis deviation Nonspecific T wave abnormality Abnormal ECG When compared with ECG of 05-Nov-2021 11:37, PREVIOUS ECG IS PRESENT Confirmed by Merrily Pew [phone]) on 01/29/2022 12:02:08 AM  Radiology DG Chest 2 View  Result Date: 01/28/2022 CLINICAL DATA:  Shortness of breath. EXAM: CHEST - 2 VIEW COMPARISON:  Chest x-ray 10/17/2021 FINDINGS: The heart is enlarged, unchanged. There is no focal lung infiltrate, pleural effusion or pneumothorax. No acute fractures are seen. IMPRESSION: 1. Stable cardiomegaly. 2. No acute cardiopulmonary process. Electronically Signed   By: Ronney Asters M.D.   On: 01/28/2022 20:56    Procedures Procedures    Medications Ordered in ED Medications  furosemide (LASIX) injection 80 mg (80 mg Intravenous Given 01/29/22 0133)  oxyCODONE-acetaminophen (PERCOCET/ROXICET) 5-325 MG per tablet 1 tablet (1 tablet Oral Given 01/29/22 0132)  ondansetron (ZOFRAN) injection 4 mg (4 mg Intravenous Given 01/29/22 0438)    ED Course/ Medical Decision Making/ A&P                           Medical Decision Making Amount and/or Complexity of Data Reviewed Labs:  ordered.  Risk Prescription drug management. Decision regarding hospitalization.   46 year old female here with likely acute on chronic heart failure.  Attempted diuresis here but continues to be very tachypneic not able to walk secondary to dyspnea and generalized weakness.  Started to have some nausea as well now.  Still feeling symptomatic and does not feel like the Lasix really helped at all.  She is not hypoxic however with her work of breathing or severe heart failure we will go ahead and admit her for observation.  XR done and showed mild bilateral pleural effusions and interstitial edema (independently viewed and interpreted by myself and radiology read reviewed).   Final Clinical Impression(s) / ED Diagnoses Final diagnoses:  SOB (shortness of breath)  Acute on chronic systolic congestive heart failure [facility])    Rx / DC Orders ED Discharge Orders     None         Shontell Prosser, Corene Cornea, MD 01/29/22 1517    Merrily Pew, MD 01/29/22 [phone]

## 2022-01-29 NOTE — ED Notes (Addendum)
Patient was not able to walk weak and feels like she has   to vomit.sats was 100%RA -HR 107

## 2022-01-29 NOTE — Assessment & Plan Note (Addendum)
Patient has increased orthopnea and dyspnea with exertion.  Patient has been unable to take her medications due to worsening depression.  BNP 296.9. Patient denies acute worsening of pain in legs but has chronic leg pain. Favor HFrEF as cause but will need to consider PE if continued SOB after diuresis, given mild tachycardia. SOB could also be due to deconditioning. Doubt infectious cause.  - Continue Lasix 80 mg IV twice daily, redose as needed - Continuous pulse ox  - Consider D-dimer - PT OT treat and eval

## 2022-01-29 NOTE — Assessment & Plan Note (Addendum)
History of A-fib on Xarelto. EKG sinus tach. Non-adherent to home medications due to severe depression. - Continue Xarelto - On cardiac telemetry

## 2022-01-29 NOTE — Assessment & Plan Note (Addendum)
Patient with systolic and diastolic chronic heart failure, last echo in April with a EF of 20-25%. Home medications include torsemide 40 mg twice daily, spironolactone 12.5 mg daily, Jardiance 10 mg daily, digoxin 0.125 mg daily, Coreg 6.25 mg twice daily. Digoxin level subtherapeutic. Has not been taking her medications due to severe depression. Dry weight 200 lbs, no weight recorded this admission. - On cardiac telemetry - Lasix 80 mg IV twice daily - BMP daily, watch renal function  - Continue spironolactone, jardiance, digoxin, coreg -Strict I/O, daily weights -Defer repeat Echo

## 2022-01-29 NOTE — ED Notes (Signed)
Patient called RN to room asking me to remove IV. Advised that while a patient she must keep her IV in. Patient states she is going to leave. MD paged regarding same.

## 2022-01-29 NOTE — Assessment & Plan Note (Addendum)
A1c 12.1. Poorly controlled despite reported compliance with insulin at home.  -CBG AC and QHS  -rSSI with HS coverage -Start 25U basal

## 2022-01-29 NOTE — Assessment & Plan Note (Signed)
BP elevated up to 255Q systolic, 016Y diastolic.  -Monitor BP  -Continue home medications, IV diuresis.

## 2022-01-29 NOTE — ED Notes (Signed)
ED TO INPATIENT HANDOFF REPORT  ED Nurse Name and Phone #: Rose Fillers RN [phone]  S Name/Age/Gender [name] 46 y.o. female Room/Bed: 025C/025C  Code Status: Full   Home  Patient alert and oriented x4 Is this baseline? Yes  Triage Complete: Triage complete  Chief Complaint Acute heart failure ([place]) [I50.9]  Triage Note Pt BIB [facility] from home, c/o increased shortness of breath at rest and exertion, weight gain, and difficulty lying flat. EMS VS: BP 124/72, HR 90, Spo2 100% room air. Hx CHF, asthma.   Allergies Allergies  Allergen Reactions   Shellfish Allergy Swelling    Airway involvement including EMS - Has Epi-Pen   Metformin And Related Diarrhea   Trulicity [Dulaglutide] Nausea And Vomiting and Other (See Comments)    Multiple episodes of vomiting over multiple days   Ibuprofen Other (See Comments)    Per PCP interferes with daily meds (heart failure)    Level of Care/Admitting Diagnosis ED Disposition     ED Disposition  Admit   Condition  --   [place]: [place] [[id number]]  Level of Care: Med-Surg [16]  May place patient in observation at [facility] or [place] if equivalent level of care is available:: No  Covid Evaluation: Asymptomatic - no recent exposure (last 10 days) testing not required  Diagnosis: Acute heart failure [facility]) [[id number]]  Admitting Physician: Lowry Ram [[id number]]  Attending Physician: Lind Covert [[id number]]          B Medical/Surgery History Past Medical History:  Diagnosis Date   Abnormal Pap smear    Acute on chronic combined systolic and diastolic CHF (congestive heart failure) ([place]) 02/19/2019   Acute on chronic congestive heart failure (H[facility])    Acute on chronic congestive heart failure ([place])    Acute on chronic diastolic CHF (congestive heart failure) ([place]) 10/19/2021   Anemia    Asthma    Atrial fibrillation ([place])    history of paroxysmal atrial fibrillation   CHF  exacerbation ([place]) 10/17/2021   Diabetes mellitus    DKA, type 2 ([place]) 02/06/2019   Elevated troponin 09/30/2019   Heart failure with reduced ejection fraction ([place])    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  Consider chronic etiology given continued fertility issues   Hypertension    Hypertensive emergency 09/30/2019   Major depressive disorder 02/08/2012   Reports that 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     Migraines    Morbid obesity ([place]) 02/06/2019   Nonischemic cardiomyopathy ([place]) 09/22/2021   Obstructive sleep apnea 06/30/2015   Polycystic ovarian disease    Past Surgical History:  Procedure Laterality Date   BIOPSY  11/05/2021   Procedure: BIOPSY;  Surgeon: Daryel November, MD;  Location: [facility] ENDOSCOPY;  Service: Gastroenterology;;   CERVIX LESION DESTRUCTION     CESAREAN SECTION     ESOPHAGOGASTRODUODENOSCOPY (EGD) WITH PROPOFOL N/A 11/05/2021   Procedure: ESOPHAGOGASTRODUODENOSCOPY (EGD) WITH PROPOFOL;  Surgeon: Daryel November, MD;  Location: [place];  Service: Gastroenterology;  Laterality: N/A;   RIGHT/LEFT HEART CATH AND CORONARY ANGIOGRAPHY N/A 03/29/2019   Procedure: RIGHT/LEFT HEART CATH AND CORONARY ANGIOGRAPHY;  Surgeon: Larey Dresser, MD;  Location: [place] CV LAB;  Service: Cardiovascular;  Laterality: N/A;   RIGHT/LEFT HEART CATH AND CORONARY ANGIOGRAPHY N/A 09/20/2021   Procedure:  RIGHT/LEFT HEART CATH AND CORONARY ANGIOGRAPHY;  Surgeon: Larey Dresser, MD;  Location: [place] CV LAB;  Service: Cardiovascular;  Laterality: N/A;     A IV Location/Drains/Wounds Patient Lines/Drains/Airways Status     Active Line/Drains/Airways     Name Placement date Placement time Site Days   Peripheral IV 01/29/22 20 G 1.88" Anterior;Left Forearm 01/29/22  0022  Forearm  less than 1   External Urinary Catheter --  --  --  --    External Urinary Catheter 01/29/22  0147  --  less than 1            Intake/Output Last 24 hours  Intake/Output Summary (Last 24 hours) at 01/29/2022 0553 Last data filed at 01/29/2022 0405 Gross per 24 hour  Intake --  Output 2400 ml  Net -2400 ml    Labs/Imaging Results for orders placed or performed during the hospital encounter of 01/28/22 (from the past 48 hour(s))  Magnesium     Status: None   Collection Time: 01/28/22  8:50 PM  Result Value Ref Range   Magnesium 1.8 1.7 - 2.4 mg/dL    Comment: Performed at [facility], [place] [address] [place], [place] [address]  Brain natriuretic peptide (order ONLY if patient c/o SOB)     Status: Abnormal   Collection Time: 01/28/22  8:50 PM  Result Value Ref Range   B Natriuretic Peptide 296.9 (H) 0.0 - 100.0 pg/mL    Comment: Performed at [facility] [address] [place], [place] [address]  CBC     Status: Abnormal   Collection Time: 01/28/22  8:50 PM  Result Value Ref Range   WBC 11.2 (H) 4.0 - 10.5 K/uL   RBC 4.67 3.87 - 5.11 MIL/uL   Hemoglobin 11.7 (L) 12.0 - 15.0 g/dL   HCT 36.6 36.0 - 46.0 %   MCV 78.4 (L) 80.0 - 100.0 fL   MCH 25.1 (L) 26.0 - 34.0 pg   MCHC 32.0 30.0 - 36.0 g/dL   RDW 14.3 11.5 - 15.5 %   Platelets 304 150 - 400 K/uL   nRBC 0.0 0.0 - 0.2 %    Comment: Performed at [facility], [place] [address], [place], [place] [address]  Comprehensive metabolic panel     Status: Abnormal   Collection Time: 01/28/22  8:50 PM  Result Value Ref Range   Sodium 133 (L) 135 - 145 mmol/L   Potassium 3.8 3.5 - 5.1 mmol/L   Chloride 101 98 - 111 mmol/L   CO2 19 (L) 22 - 32 mmol/L   Glucose, Bld 367 (H) 70 - 99 mg/dL    Comment: Glucose reference range applies only to samples taken after fasting for at least 8 hours.   BUN 10 6 - 20 mg/dL   Creatinine, Ser 0.84 0.44 - 1.00 mg/dL   Calcium 8.8 (L) 8.9 - 10.3 mg/dL   Total Protein 7.0 6.5 - 8.1 g/dL   Albumin 2.9 (L) 3.5 - 5.0 g/dL   AST 21 15 - 41  U/L   ALT 17 0 - 44 U/L   Alkaline Phosphatase 101 38 - 126 U/L   Total Bilirubin 0.8 0.3 - 1.2 mg/dL   GFR, Estimated >60 >60 mL/min    Comment: (NOTE) Calculated using the CKD-EPI Creatinine Equation (2021)    Anion gap 13 5 - 15    Comment: Performed at [facility], [place] [address] [place], [place] [address]  I-Stat  beta hCG blood, ED     Status: None   Collection Time: 01/28/22 10:46 PM  Result Value Ref Range   I-stat hCG, quantitative <5.0 <5 mIU/mL   Comment 3            Comment:   GEST. AGE      CONC.  (mIU/mL)   <=1 WEEK        5 - 50     2 WEEKS       50 - 500     3 WEEKS       100 - 10,000     4 WEEKS     1,000 - 30,000        FEMALE AND NON-PREGNANT FEMALE:     LESS THAN 5 mIU/mL   Digoxin level     Status: Abnormal   Collection Time: 01/29/22  1:25 AM  Result Value Ref Range   Digoxin Level <0.2 (L) 0.8 - 2.0 ng/mL    Comment: RESULTS CONFIRMED BY MANUAL DILUTION Performed at [facility], [place] [address] [place], [place] [address]    DG Chest 2 View  Result Date: 01/28/2022 CLINICAL DATA:  Shortness of breath. EXAM: CHEST - 2 VIEW COMPARISON:  Chest x-ray 10/17/2021 FINDINGS: The heart is enlarged, unchanged. There is no focal lung infiltrate, pleural effusion or pneumothorax. No acute fractures are seen. IMPRESSION: 1. Stable cardiomegaly. 2. No acute cardiopulmonary process. Electronically Signed   By: Ronney Asters M.D.   On: 01/28/2022 20:56    Pending Labs Unresulted Labs (From admission, onward)    None       Vitals/Pain Today's Vitals   01/29/22 0434 01/29/22 0450 01/29/22 0500 01/29/22 0530  BP: (!) 140/105  (!) 144/113 (!) 158/116  Pulse: 95  92 (!) 103  Resp: (!) '26  15 20  '$ Temp: (!) 97.5 F (36.4 C)     TempSrc: Oral     SpO2: 100%  100% 97%  PainSc: 0-No pain 0-No pain      Isolation Precautions No active isolations  Medications Medications  furosemide (LASIX) injection 80 mg (80 mg Intravenous Given 01/29/22 0133)   oxyCODONE-acetaminophen (PERCOCET/ROXICET) 5-325 MG per tablet 1 tablet (1 tablet Oral Given 01/29/22 0132)  ondansetron (ZOFRAN) injection 4 mg (4 mg Intravenous Given 01/29/22 0438)    Mobility : Ambulatory     R Recommendations: See Admitting Provider Note

## 2022-01-29 NOTE — Telephone Encounter (Signed)
Patient returned call to my cell after I left VM after she left from the ER having been admitted for CHF exacerbation  She reported she felt ok She was disappointed no one seemed to be helping her She is unsure if she would return to the ER if she felt worse She is unsure if she will [facility] to [facility]   I related I was sorry she did not feel we were attending to her I emphasized we cared how she was doing and that we had her best long term interest in mind I encouraged her to return to the ER if she felt really bad and to call our oncall line if she had urgent questions Also that I hoped she would follow up at the [facility] and her PCP  She related she would consider it  She sounded in no distress and was calm and rational

## 2022-01-29 NOTE — H&P (Addendum)
Hospital Admission History and Physical Service Pager: [phone]  Patient name: [name] Medical record number: [id number] Date of Birth: Mar 04, 1976 Age: 46 y.o. Gender: female  Primary Care Provider: Darci Current, DO Consultants: Psychiatry  Code Status: FULL CODE  Preferred Emergency Contact:  Contact Information     Name Relation Home Work Mobile   [place] Daughter   [phone]      Chief Complaint: Shortness of Breath  Assessment and Plan: [name] is a 46 y.o. female presenting with shortness of breath. Differential for this patient's presentation of this includes HFrEF exacerbation (last EF 20-25%), PE, PNA, anxiety.  Less likely PE or pneumonia as oxygen saturation is 100% and x-ray unremarkable for airspace disease. Favor HFrEF as cause as patient endorses more orthopnea and with sub-optimal adherence to take her medications.    CHF (congestive heart failure) ([place]) Patient with systolic and diastolic chronic heart failure, last echo in April with a EF of 20-25%. Home medications include torsemide 40 mg twice daily, spironolactone 12.5 mg daily, Jardiance 10 mg daily, digoxin 0.125 mg daily, Coreg 6.25 mg twice daily. Digoxin level subtherapeutic. Has not been taking her medications due to severe depression. Dry weight 200 lbs, no weight recorded this admission. - On cardiac telemetry - Lasix 80 mg IV twice daily - BMP daily, watch renal function  - Continue spironolactone, jardiance, digoxin, coreg -Strict I/O, daily weights -Defer repeat Echo   Paroxysmal atrial fibrillation (HCC) History of A-fib on Xarelto. EKG sinus tach. Non-adherent to home medications due to severe depression. - Continue Xarelto - On cardiac telemetry  Shortness of breath Patient has increased orthopnea and dyspnea with exertion.  Patient has been unable to take her medications due to worsening depression.  BNP 296.9. Patient denies acute worsening of pain in legs but has  chronic leg pain. Favor HFrEF as cause but will need to consider PE if continued SOB after diuresis, given mild tachycardia. SOB could also be due to deconditioning. Doubt infectious cause.  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  Depression is severe enough that she is considering palliative care.  Denies active SI but is frustrated regarding her chronic illness and pain. - Continue home Zoloft, Atarax, trazodone -Consult psychiatry to consider ECT, adjunct to medication such as Abilify, consider Cymbalta for pain   Hypertension associated with diabetes ([place]) BP elevated up to 751Z systolic, 001V diastolic.  -Monitor BP  -Continue home medications, IV diuresis.  Poorly controlled type 2 diabetes mellitus (HCC) A1c 12.1. Poorly controlled despite reported compliance with insulin at home.  -CBG AC and QHS  -rSSI with HS coverage -Start 25U basal    FEN/GI: Carb consistent/heart healthy  VTE Prophylaxis: Xarelto    Disposition: Admit to Med-Tele   History of Present Illness:  [name] is a 46 y.o. female presenting with shortness of breath.  Has felt more short of breath since hospital discharge, worsening in the last couple days. Has been harder to do normal everyday activities. Feels this is due to her shortness of breath, migraines and due to poor sleep. States that she hasn't slept for days. Denies any racing thoughts. She is frustrated with her chronic illnesses and doesn't always take her medications because of this.   She feels like her  whole body is hurting, she states that she hurts all day.   She takes her insulin and anxiety medication daily.   States that she has accepted that this is not a curable disease. She also feels like "whatever it's gonna do, just let it  do it". She feels like every time she comes to the hospital she is given Torsemide. She notes that she has other chronic illnesses like asthma and high blood pressure.   She states that dry weight is 200 lbs. She knows to keep her weight around here. She is unsure if she feels fluid overloaded. Sleeps with 6 pillows normally.   She endorses chest pressure. Laying flat makes it worse. Endorses palpitations, unsure the frequency.   Denies SI. She doesn't have a goal. States the only reason that she came is because her sister recently had a baby and wanted her to be seen.   In the ED, patient has been hemodynamically stable; however she has been very short of breath. She was given 80 mg IV lasix. As patient attempted to ambulate to the bathroom she was very short of breath and weak. Patient noted to have difficulty with taking her medications due to depression secondary to chronic disease burden.   Review Of Systems: Per HPI.  Pertinent Past Medical History: CHF (combined systolic and diastolic), Paroxysmal Afib, T2DM, HTN, Anemia, MDD, OSA Remainder reviewed in history tab.   Pertinent Past Surgical History: R/L Heart Cath 2020, 2023   Remainder reviewed in history tab.   Pertinent Social History: Tobacco use: No Alcohol use: No Other Substance use: No Lives alone. Daughters visit on occasion.   Pertinent Family History: Hypertension and DM in mother and father   Remainder reviewed in history tab.   Important Outpatient Medications: Carvedilol, Digoxin, Jardiance, Insulin, Xarelto, Zoloft, Aldactone, Torsemide, Trazodone  Remainder reviewed in medication history.   Objective: BP (!) 137/105   Pulse 87   Temp (!) 97.5 F (36.4 C) (Oral)   Resp 13   SpO2 100%  Exam: General: Depressed appearing, intermittently tearful  Cardiovascular: Tachycardic regular rhythm S3 auscultated, hepatojugular reflux positive Respiratory: No increased work of breathing, clear to auscultation  bilaterally, diminished at bases Gastrointestinal: Soft nondistended nontender to palpation Psych: Depressed mood, flat affect, tearful during, catastrophic thinking, black or white thinking  Labs:  CBC BMET  Recent Labs  Lab 01/28/22 2050  WBC 11.2*  HGB 11.7*  HCT 36.6  PLT 304   Recent Labs  Lab 01/28/22 2050  NA 133*  K 3.8  CL 101  CO2 19*  BUN 10  CREATININE 0.84  GLUCOSE 367*  CALCIUM 8.8*    Pertinent additional labs BNP  296.9  EKG: Tachycardic, poor R wave progression, no acute changes,   Imaging Studies Performed:  Imaging Study (ie. Chest x-ray) Impression from Radiologist: Stable cardiomegaly, no acute cardiopulmonary process  My Interpretation: Possible minor right-sided pleural effusion difficult to assess due to body habitus, no airspace disease otherwise or pneumothorax  Lowry Ram, MD 01/29/2022, 5:31 AM PGY-1, [place] Intern pager: [phone], text pages welcome Secure chat group [facility] Upper-Level Resident Addendum   I have independently interviewed and examined the patient. I have discussed the above with the original author and agree with their documentation. My edits for correction/addition/clarification are included where appropriate. Please see also any attending notes.   Sharion Settler, DO PGY-3, [facility] 01/29/2022 7:48 AM  [place] Service pager: [phone] (text pages welcome through [facility])

## 2022-01-29 NOTE — Assessment & Plan Note (Addendum)
Patient with severe major depression disorder secondary to chronic illnesses and chronic pain.  Depression is severe enough that she is considering palliative care.  Denies active SI but is frustrated regarding her chronic illness and pain. - Continue home Zoloft, Atarax, trazodone -Consult psychiatry to consider ECT, adjunct to medication such as Abilify, consider Cymbalta for pain

## 2022-02-02 ENCOUNTER — Encounter: Payer: Self-pay | Admitting: Cardiovascular Disease

## 2022-02-02 ENCOUNTER — Ambulatory Visit (INDEPENDENT_AMBULATORY_CARE_PROVIDER_SITE_OTHER): Payer: Medicaid Other | Admitting: Cardiovascular Disease

## 2022-02-02 DIAGNOSIS — E785 Hyperlipidemia, unspecified: Secondary | ICD-10-CM

## 2022-02-02 DIAGNOSIS — E1169 Type 2 diabetes mellitus with other specified complication: Secondary | ICD-10-CM | POA: Diagnosis not present

## 2022-02-02 DIAGNOSIS — I5042 Chronic combined systolic (congestive) and diastolic (congestive) heart failure: Secondary | ICD-10-CM

## 2022-02-02 DIAGNOSIS — I480 Paroxysmal atrial fibrillation: Secondary | ICD-10-CM | POA: Diagnosis not present

## 2022-02-02 NOTE — Assessment & Plan Note (Signed)
History of biventricular failure with nonischemic cardiomyopathy.  Minimal CAD by cath performed by Dr. Aundra Dubin who follows her in the heart failure clinic 09/20/2021 with PA pressures of 46/28.  She is on guideline directed optimal medical therapy but readily admits to medication noncompliance.

## 2022-02-02 NOTE — Assessment & Plan Note (Signed)
History of hyperlipidemia on statin therapy lipid profile performed 09/18/2021 revealing a total cholesterol of 222 LDL 116 HDL 43.  I suspect this is related to medication noncompliance and dietary noncompliance.

## 2022-02-02 NOTE — Progress Notes (Signed)
02/02/2022 WALLACE COGLIANO   June 15, 1976  [id number]  Primary Physician Darci Current, DO Primary Cardiologist: Lorretta Harp MD Garret Reddish, [place], [place]  HPI:  [name] is a 46 y.o. mildly overweight single African-American female mother of 2 children, grandmother and 5 grandchildren who works as a Recruitment consultant.  She is a patient of Dr. Aundra Dubin to the heart failure clinic.  She was just seen in the ER 2 days ago with diffuse body pain and heart failure and left prematurely.  She has been hospitalized several times for the last 6 months for heart failure probably related to medication noncompliance.  She has an EF in the 20% range with noncritical CAD by cath performed by Dr. Aundra Dubin 09/18/2021.  She also has diabetes, and hypertension.   Current Meds  Medication Sig   Accu-Chek Softclix Lancets lancets Use as instructed   albuterol (VENTOLIN HFA) 108 (90 Base) MCG/ACT inhaler Inhale 2 puffs into the lungs every 6 (six) hours as needed for wheezing or shortness of breath.   atorvastatin (LIPITOR) 40 MG tablet TAKE 1 TABLET BY MOUTH DAILY AT 6 PM.   blood glucose meter kit and supplies KIT Dispense based on patient and insurance preference. Use up to four times daily as directed. (FOR ICD-9 250.00, 250.01).   Blood Glucose Monitoring Suppl (ACCU-CHEK AVIVA PLUS) w/Device KIT 1 application by Does not apply route QID. Check fasting sugars as well as throughout the day   carvedilol (COREG) 6.25 MG tablet Take 1 tablet (6.25 mg total) by mouth 2 (two) times daily with a meal.   digoxin (LANOXIN) 0.125 MG tablet Take 1 tablet (0.125 mg total) by mouth daily.   empagliflozin (JARDIANCE) 10 MG TABS tablet Take 1 tablet (10 mg total) by mouth daily.   glucose blood (ACCU-CHEK AVIVA PLUS) test strip Check CBG 2 times per day   glucose blood (AGAMATRIX PRESTO TEST) test strip Use as instructed   hydrOXYzine (ATARAX) 10 MG tablet Take 1 tablet (10 mg total) by mouth 3 (three) times daily as  needed. (Patient taking differently: Take 10 mg by mouth 3 (three) times daily as needed for anxiety.)   insulin aspart (NOVOLOG FLEXPEN) 100 UNIT/ML FlexPen Inject 12 Units into the skin daily before supper.   insulin glargine (LANTUS) 100 UNIT/ML Solostar Pen Inject 36 Units into the skin every morning.   Insulin Pen Needle (UNIFINE PENTIPS) 31G X 5 MM MISC USE AS DIRECTED 2 TIMES DAILY   Insulin Syringe-Needle U-100 (INSULIN SYRINGE .3CC/31GX5/16") 31G X 5/16" 0.3 ML MISC 15 Units by Does not apply route 2 (two) times daily.   Lancets Misc. (ACCU-CHEK SOFTCLIX LANCET DEV) KIT 1 application by Does not apply route 2 (two) times daily.   metoCLOPramide (REGLAN) 5 MG tablet Take 1 tablet (5 mg total) by mouth every 8 (eight) hours as needed for nausea.   rivaroxaban (XARELTO) 20 MG TABS tablet Take 1 tablet (20 mg total) by mouth daily with supper.   sertraline (ZOLOFT) 50 MG tablet Take 1 tablet (50 mg total) by mouth daily.   spironolactone (ALDACTONE) 25 MG tablet Take 0.5 tablets (12.5 mg total) by mouth daily.   torsemide (DEMADEX) 20 MG tablet Take 2 tablets (40 mg total) by mouth 2 (two) times daily.   traZODone (DESYREL) 50 MG tablet Take 1 tablet (50 mg total) by mouth at bedtime.     Allergies  Allergen Reactions   Shellfish Allergy Swelling    Airway involvement including  EMS - Has Epi-Pen   Metformin And Related Diarrhea   Trulicity [Dulaglutide] Nausea And Vomiting and Other (See Comments)    Multiple episodes of vomiting over multiple days   Ibuprofen Other (See Comments)    Per PCP interferes with daily meds (heart failure)    Social History   Socioeconomic History   Marital status: Single    Spouse name: Not on file   Number of children: Not on file   Years of education: Not on file   Highest education level: Not on file  Occupational History   Not on file  Tobacco Use   Smoking status: Never   Smokeless tobacco: Never   Tobacco comments:    Never smoke 10/22/21   Vaping Use   Vaping Use: Never used  Substance and Sexual Activity   Alcohol use: No    Alcohol/week: 0.0 standard drinks of alcohol   Drug use: No   Sexual activity: Yes  Other Topics Concern   Not on file  Social History Narrative   Not on file   Social Determinants of Health   Financial Resource Strain: High Risk (10/17/2019)   Overall Financial Resource Strain (CARDIA)    Difficulty of Paying Living Expenses: Hard  Food Insecurity: No Food Insecurity (08/19/2020)   Hunger Vital Sign    Worried About Running Out of Food in the Last Year: Never true    Ran Out of Food in the Last Year: Never true  Transportation Needs: Unmet Transportation Needs (10/22/2021)   PRAPARE - Transportation    Lack of Transportation (Medical): Yes    Lack of Transportation (Non-Medical): Yes  Physical Activity: Insufficiently Active (08/19/2020)   Exercise Vital Sign    Days of Exercise per Week: 7 days    Minutes of Exercise per Session: 20 min  Stress: Stress Concern Present (08/19/2020)   [place]    Feeling of Stress : Very much  Social Connections: Not on file  Intimate Partner Violence: Not At Risk (08/19/2020)   Humiliation, Afraid, Rape, and Kick questionnaire    Fear of Current or Ex-Partner: No    Emotionally Abused: No    Physically Abused: No    Sexually Abused: No     Review of Systems: General: negative for chills, fever, night sweats or weight changes.  Cardiovascular: negative for chest pain, dyspnea on exertion, edema, orthopnea, palpitations, paroxysmal nocturnal dyspnea or shortness of breath Dermatological: negative for rash Respiratory: negative for cough or wheezing Urologic: negative for hematuria Abdominal: negative for nausea, vomiting, diarrhea, bright red blood per rectum, melena, or hematemesis Neurologic: negative for visual changes, syncope, or dizziness All other systems reviewed and are  otherwise negative except as noted above.    Blood pressure (!) 130/102, pulse 94, height '4\' 11"'  (1.499 m), weight 190 lb (86.2 kg).  General appearance: alert and no distress Neck: no adenopathy, no carotid bruit, no JVD, supple, symmetrical, trachea midline, and thyroid not enlarged, symmetric, no tenderness/mass/nodules Lungs: clear to auscultation bilaterally Heart: regular rate and rhythm, S1, S2 normal, no murmur, click, rub or gallop Extremities: extremities normal, atraumatic, no cyanosis or edema Pulses: 2+ and symmetric Skin: Skin color, texture, turgor normal. No rashes or lesions Neurologic: Grossly normal  EKG not performed today  ASSESSMENT AND PLAN:   Paroxysmal atrial fibrillation (HCC) History of PAF maintained sinus rhythm on Xarelto oral anticoagulation.  Hyperlipidemia associated with type 2 diabetes mellitus ([place]) History of hyperlipidemia on statin therapy  lipid profile performed 09/18/2021 revealing a total cholesterol of 222 LDL 116 HDL 43.  I suspect this is related to medication noncompliance and dietary noncompliance.  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  Minimal CAD by cath performed by Dr. Aundra Dubin who follows her in the heart failure clinic 09/20/2021 with PA pressures of 46/28.  She is on guideline directed optimal medical therapy but readily admits to medication noncompliance.     Lorretta Harp MD FACP,FACC,FAHA, [facility] 02/02/2022 4:00 PM

## 2022-02-02 NOTE — Patient Instructions (Signed)
Medication Instructions:  °Your physician recommends that you continue on your current medications as directed. Please refer to the Current Medication list given to you today. ° °*If you need a refill on your cardiac medications before your next appointment, please call your pharmacy* ° ° °Follow-Up: °At [facility], you and your health needs are our priority.  As part of our continuing mission to provide you with exceptional heart care, we have created designated Provider Care Teams.  These Care Teams include your primary Cardiologist (physician) and Advanced Practice Providers (APPs -  Physician Assistants and Nurse Practitioners) who all work together to provide you with the care you need, when you need it. ° °We recommend signing up for the patient portal called "MyChart".  Sign up information is provided on this After Visit Summary.  MyChart is used to connect with patients for Virtual Visits (Telemedicine).  Patients are able to view lab/test results, encounter notes, upcoming appointments, etc.  Non-urgent messages can be sent to your provider as well.   °To learn more about what you can do with MyChart, go to [email or web address].   ° °Your next appointment:   °We will see you on an as needed basis. ° ° °Provider:  °Jonathan Berry, MD ° °

## 2022-02-02 NOTE — Assessment & Plan Note (Signed)
History of PAF maintained sinus rhythm on Xarelto oral anticoagulation.

## 2022-02-10 ENCOUNTER — Other Ambulatory Visit (HOSPITAL_COMMUNITY): Payer: Self-pay

## 2022-02-10 ENCOUNTER — Ambulatory Visit (INDEPENDENT_AMBULATORY_CARE_PROVIDER_SITE_OTHER): Payer: Medicaid Other | Admitting: Student

## 2022-02-10 ENCOUNTER — Encounter: Payer: Self-pay | Admitting: Student

## 2022-02-10 VITALS — BP 175/132 | HR 111 | Ht 59.0 in | Wt 190.1 lb

## 2022-02-10 DIAGNOSIS — R0789 Other chest pain: Secondary | ICD-10-CM | POA: Diagnosis not present

## 2022-02-10 DIAGNOSIS — R110 Nausea: Secondary | ICD-10-CM | POA: Diagnosis not present

## 2022-02-10 DIAGNOSIS — R112 Nausea with vomiting, unspecified: Secondary | ICD-10-CM | POA: Diagnosis not present

## 2022-02-10 DIAGNOSIS — G8929 Other chronic pain: Secondary | ICD-10-CM | POA: Diagnosis not present

## 2022-02-10 MED ORDER — NAPROXEN 500 MG PO TABS
ORAL_TABLET | ORAL | 0 refills | Status: DC
  Filled 2022-02-10: qty 10, 5d supply, fill #0

## 2022-02-10 MED ORDER — VENLAFAXINE HCL ER 37.5 MG PO CP24
37.5000 mg | ORAL_CAPSULE | Freq: Every day | ORAL | 0 refills | Status: DC
  Filled 2022-02-10: qty 30, 30d supply, fill #0

## 2022-02-10 MED ORDER — ONDANSETRON HCL 4 MG PO TABS
4.0000 mg | ORAL_TABLET | Freq: Three times a day (TID) | ORAL | 0 refills | Status: DC | PRN
  Filled 2022-02-10: qty 20, 7d supply, fill #0

## 2022-02-10 MED ORDER — NAPROXEN 500 MG PO TABS
500.0000 mg | ORAL_TABLET | Freq: Two times a day (BID) | ORAL | 0 refills | Status: DC
  Filled 2022-02-10: qty 10, 5d supply, fill #0

## 2022-02-10 NOTE — Assessment & Plan Note (Signed)
Prescribed short course of Zofran  Counseled on BRAT diet, Pedialyte Obtain CMP to check electrolytes since she has been vomiting for several days.

## 2022-02-10 NOTE — Assessment & Plan Note (Addendum)
Chronic pain is an ongoing issue for [name]. She has been taking tylenol with little to moderate relief.  - in order to avoid opioids, will prescribe a short term PRN dose of Naproxen to help with the patients acute pain. Patient understands increased bleeding risk with Xarelto, but that it could be beneficial short term for pain relief. Dr. Erin Hearing aware of medication prescription and establishment of therapeutic relationship with patient.  - for a long term management of her chronic pain, I prescribed Effexor XL. I discussed with [name] that this could be a good long term option that has lower side effects and is compatible with all her other medications. Discontinue trazodone and Zoloft while starting new medication.  - close follow up in 4 weeks to assess patients pain level and to check for risks of bleeding

## 2022-02-10 NOTE — Progress Notes (Signed)
    SUBJECTIVE:   CHIEF COMPLAINT / HPI:   46 year old female here for follow up after an ED visit for her heart failure. She has been throwing up nonstop for the past 2 weeks, unable to eat or drink fluids without nausea. She denies throwing up blood. She is also dealing with a chronic pain issue that she describes as diffuse, sharp, and constant. She normally takes tylenol which provides minimal relief from her pain.   She is very frustrated with the health care she has received and does not feel like her medications are beneficial or helping her underlying pain.   PERTINENT  PMH / PSH: T2DM, HFrEF, a fib, OSA  OBJECTIVE:   Ht '4\' 11"'$  (1.499 m)   Wt 190 lb 2 oz (86.2 kg)   LMP 02/02/2022   SpO2 98%   BMI 38.40 kg/m   Physical Exam:  Cardio: irregular rate, tachycardic  Pulm: clear, no crackles Extremities: no peripheral edema   ASSESSMENT/PLAN:   Chronic chest wall pain Chronic pain is an ongoing issue for Mrs. Ronnald Ramp. She has been taking tylenol with little to moderate relief.  - in order to avoid opioids, will prescribe a short term PRN dose of Naproxen to help with the patients acute pain. Patient understands increased bleeding risk with Xarelto, but that it could be beneficial short term for pain relief. Dr. Erin Hearing aware of medication prescription and establishment of therapeutic relationship with patient.  - for a long term management of her chronic pain, I prescribed Effexor XL. I discussed with Mrs. Pollman that this could be a good long term option that has lower side effects and is compatible with all her other medications. Discontinue trazodone and Zoloft while starting new medication.  - close follow up in 4 weeks to assess patients pain level and to check for risks of bleeding  Chronic nausea Prescribed short course of Zofran  Counseled on BRAT diet, Pedialyte Obtain CMP to check electrolytes since she has been vomiting for several days.     FOLLOW UP IN [place]

## 2022-02-10 NOTE — Patient Instructions (Addendum)
It was great to see you today! Thank you for choosing [facility] for your primary care.   Today we addressed: Nausea and Vomiting: I have sent in Zofran to the pharmacy. Take this before attempting to eat and drink to help with your symptoms. Start with the BRAT diet: bananas, rice, applesauce, toast to ease your stomach back into eating. Pedialyte is helpful with your electrolytes. I am checking your blood today to monitor your electrolytes since you have been vomiting.  Chronic Pain: I am sending you in a medication called Naproxen. This can be used intermittently for your pain but you can not use it long term. I have also sent in a medication called Effexor XL. This medication is great for chronic pain.  Please come back to see me in the next few weeks!  If you haven't already, sign up for My Chart to have easy access to your labs results, and communication with your primary care physician.  We are checking some labs today. If they are abnormal, I will call you. If they are normal, I will send you a MyChart message (if it is active) or a letter in the mail. If you do not hear about your labs in the next 2 weeks, please call the office.   You should return to our clinic No follow-ups on file.  I recommend that you always bring your medications to each appointment as this makes it easy to ensure you are on the correct medications and helps [place] not miss refills when you need them.  Please arrive 15 minutes before your appointment to ensure smooth check in process.  We appreciate your efforts in making this happen.  Please call the clinic at [phone] if your symptoms worsen or you have any concerns.  Thank you for allowing me to participate in your care, Dr. Sabra Heck

## 2022-02-11 LAB — COMPREHENSIVE METABOLIC PANEL
ALT: 29 IU/L (ref 0–32)
AST: 27 IU/L (ref 0–40)
Albumin/Globulin Ratio: 1 — ABNORMAL LOW (ref 1.2–2.2)
Albumin: 3.8 g/dL — ABNORMAL LOW (ref 3.9–4.9)
Alkaline Phosphatase: 149 IU/L — ABNORMAL HIGH (ref 44–121)
BUN/Creatinine Ratio: 9 (ref 9–23)
BUN: 7 mg/dL (ref 6–24)
Bilirubin Total: 1.8 mg/dL — ABNORMAL HIGH (ref 0.0–1.2)
CO2: 22 mmol/L (ref 20–29)
Calcium: 9.5 mg/dL (ref 8.7–10.2)
Chloride: 95 mmol/L — ABNORMAL LOW (ref 96–106)
Creatinine, Ser: 0.82 mg/dL (ref 0.57–1.00)
Globulin, Total: 3.7 g/dL (ref 1.5–4.5)
Glucose: 347 mg/dL — ABNORMAL HIGH (ref 70–99)
Potassium: 3.9 mmol/L (ref 3.5–5.2)
Sodium: 134 mmol/L (ref 134–144)
Total Protein: 7.5 g/dL (ref 6.0–8.5)
eGFR: 90 mL/min/{1.73_m2} (ref >59)

## 2022-02-11 NOTE — Progress Notes (Signed)
Hi [name], we got blood work last visit to check your electrolytes since you have been vomiting for the past few weeks.   Your electrolytes look good. So you do not need to have them replaced.   Your blood sugar was elevated. Make sure you are taking your Insulin and Jardiance.   If you have any questions feel free to message me.   I hope you feel better soon!  Dr. Sabra Heck

## 2022-02-11 NOTE — Progress Notes (Signed)
Hi Mrs. Batley, we got blood work last visit to check your electrolytes since you have been vomiting for the past few weeks.   Your electrolytes look good. So you do not need to have them replaced.   Your blood sugar was elevated. Make sure you are taking your Insulin and Jardiance.   If you have any questions feel free to message me.   I hope you feel better soon!  Dr. Sabra Heck

## 2022-02-14 ENCOUNTER — Encounter: Payer: Self-pay | Admitting: Family Medicine

## 2022-02-18 ENCOUNTER — Other Ambulatory Visit (HOSPITAL_COMMUNITY): Payer: Self-pay

## 2022-02-22 ENCOUNTER — Encounter (HOSPITAL_COMMUNITY): Payer: Self-pay

## 2022-02-22 ENCOUNTER — Other Ambulatory Visit: Payer: Self-pay

## 2022-02-22 ENCOUNTER — Emergency Department (HOSPITAL_COMMUNITY): Payer: Medicaid Other

## 2022-02-22 ENCOUNTER — Inpatient Hospital Stay (HOSPITAL_COMMUNITY)
Admission: EM | Admit: 2022-02-22 | Discharge: 2022-02-28 | DRG: 291 | Disposition: A | Discharge status: Home / Self Care | Payer: Medicaid Other | Attending: Family Medicine | Admitting: Family Medicine

## 2022-02-22 DIAGNOSIS — Z6838 Body mass index (BMI) 38.0-38.9, adult: Secondary | ICD-10-CM

## 2022-02-22 DIAGNOSIS — Z833 Family history of diabetes mellitus: Secondary | ICD-10-CM | POA: Diagnosis not present

## 2022-02-22 DIAGNOSIS — I513 Intracardiac thrombosis, not elsewhere classified: Secondary | ICD-10-CM | POA: Diagnosis present

## 2022-02-22 DIAGNOSIS — I5023 Acute on chronic systolic (congestive) heart failure: Secondary | ICD-10-CM

## 2022-02-22 DIAGNOSIS — Z888 Allergy status to other drugs, medicaments and biological substances status: Secondary | ICD-10-CM | POA: Diagnosis not present

## 2022-02-22 DIAGNOSIS — R112 Nausea with vomiting, unspecified: Secondary | ICD-10-CM

## 2022-02-22 DIAGNOSIS — Z7901 Long term (current) use of anticoagulants: Secondary | ICD-10-CM

## 2022-02-22 DIAGNOSIS — K219 Gastro-esophageal reflux disease without esophagitis: Secondary | ICD-10-CM | POA: Diagnosis not present

## 2022-02-22 DIAGNOSIS — F32A Depression, unspecified: Secondary | ICD-10-CM | POA: Diagnosis not present

## 2022-02-22 DIAGNOSIS — F339 Major depressive disorder, recurrent, unspecified: Secondary | ICD-10-CM | POA: Diagnosis present

## 2022-02-22 DIAGNOSIS — R109 Unspecified abdominal pain: Secondary | ICD-10-CM | POA: Diagnosis present

## 2022-02-22 DIAGNOSIS — I2510 Atherosclerotic heart disease of native coronary artery without angina pectoris: Secondary | ICD-10-CM | POA: Diagnosis not present

## 2022-02-22 DIAGNOSIS — Z794 Long term (current) use of insulin: Secondary | ICD-10-CM

## 2022-02-22 DIAGNOSIS — D638 Anemia in other chronic diseases classified elsewhere: Secondary | ICD-10-CM | POA: Diagnosis not present

## 2022-02-22 DIAGNOSIS — Z7984 Long term (current) use of oral hypoglycemic drugs: Secondary | ICD-10-CM

## 2022-02-22 DIAGNOSIS — Z8249 Family history of ischemic heart disease and other diseases of the circulatory system: Secondary | ICD-10-CM

## 2022-02-22 DIAGNOSIS — G4733 Obstructive sleep apnea (adult) (pediatric): Secondary | ICD-10-CM | POA: Diagnosis not present

## 2022-02-22 DIAGNOSIS — I509 Heart failure, unspecified: Secondary | ICD-10-CM | POA: Diagnosis not present

## 2022-02-22 DIAGNOSIS — Z79899 Other long term (current) drug therapy: Secondary | ICD-10-CM

## 2022-02-22 DIAGNOSIS — R739 Hyperglycemia, unspecified: Secondary | ICD-10-CM | POA: Diagnosis present

## 2022-02-22 DIAGNOSIS — I428 Other cardiomyopathies: Secondary | ICD-10-CM | POA: Diagnosis not present

## 2022-02-22 DIAGNOSIS — I5043 Acute on chronic combined systolic (congestive) and diastolic (congestive) heart failure: Secondary | ICD-10-CM | POA: Diagnosis present

## 2022-02-22 DIAGNOSIS — E1165 Type 2 diabetes mellitus with hyperglycemia: Secondary | ICD-10-CM | POA: Diagnosis not present

## 2022-02-22 DIAGNOSIS — I110 Hypertensive heart disease with heart failure: Secondary | ICD-10-CM | POA: Diagnosis not present

## 2022-02-22 DIAGNOSIS — K761 Chronic passive congestion of liver: Secondary | ICD-10-CM | POA: Diagnosis not present

## 2022-02-22 DIAGNOSIS — R1084 Generalized abdominal pain: Secondary | ICD-10-CM

## 2022-02-22 DIAGNOSIS — J9811 Atelectasis: Secondary | ICD-10-CM | POA: Diagnosis not present

## 2022-02-22 DIAGNOSIS — I214 Non-ST elevation (NSTEMI) myocardial infarction: Secondary | ICD-10-CM | POA: Diagnosis not present

## 2022-02-22 DIAGNOSIS — I480 Paroxysmal atrial fibrillation: Secondary | ICD-10-CM | POA: Diagnosis present

## 2022-02-22 DIAGNOSIS — Z91013 Allergy to seafood: Secondary | ICD-10-CM | POA: Diagnosis not present

## 2022-02-22 DIAGNOSIS — R0602 Shortness of breath: Secondary | ICD-10-CM | POA: Diagnosis not present

## 2022-02-22 HISTORY — DX: Acute on chronic systolic (congestive) heart failure: I50.23

## 2022-02-22 LAB — CBC WITH DIFFERENTIAL/PLATELET
Abs Immature Granulocytes: 0.05 10*3/uL (ref 0.00–0.07)
Basophils Absolute: 0.1 10*3/uL (ref 0.0–0.1)
Basophils Relative: 1 %
Eosinophils Absolute: 0 10*3/uL (ref 0.0–0.5)
Eosinophils Relative: 0 %
HCT: 35.6 % — ABNORMAL LOW (ref 36.0–46.0)
Hemoglobin: 11.4 g/dL — ABNORMAL LOW (ref 12.0–15.0)
Immature Granulocytes: 1 %
Lymphocytes Relative: 23 %
Lymphs Abs: 2.5 10*3/uL (ref 0.7–4.0)
MCH: 24.5 pg — ABNORMAL LOW (ref 26.0–34.0)
MCHC: 32 g/dL (ref 30.0–36.0)
MCV: 76.4 fL — ABNORMAL LOW (ref 80.0–100.0)
Monocytes Absolute: 0.7 10*3/uL (ref 0.1–1.0)
Monocytes Relative: 6 %
Neutro Abs: 7.7 10*3/uL (ref 1.7–7.7)
Neutrophils Relative %: 69 %
Platelets: 313 10*3/uL (ref 150–400)
RBC: 4.66 MIL/uL (ref 3.87–5.11)
RDW: 14.7 % (ref 11.5–15.5)
WBC: 10.9 10*3/uL — ABNORMAL HIGH (ref 4.0–10.5)
nRBC: 0 % (ref 0.0–0.2)

## 2022-02-22 LAB — BASIC METABOLIC PANEL
Anion gap: 15 (ref 5–15)
BUN: 12 mg/dL (ref 6–20)
CO2: 17 mmol/L — ABNORMAL LOW (ref 22–32)
Calcium: 8.8 mg/dL — ABNORMAL LOW (ref 8.9–10.3)
Chloride: 96 mmol/L — ABNORMAL LOW (ref 98–111)
Creatinine, Ser: 1.02 mg/dL — ABNORMAL HIGH (ref 0.44–1.00)
GFR, Estimated: >60 mL/min (ref >60)
Glucose, Bld: 585 mg/dL (ref 70–99)
Potassium: 4.2 mmol/L (ref 3.5–5.1)
Sodium: 128 mmol/L — ABNORMAL LOW (ref 135–145)

## 2022-02-22 LAB — TROPONIN I (HIGH SENSITIVITY)
Troponin I (High Sensitivity): 161 ng/L (ref <18)
Troponin I (High Sensitivity): 155 ng/L (ref <18)

## 2022-02-22 LAB — MAGNESIUM: Magnesium: 1.7 mg/dL (ref 1.7–2.4)

## 2022-02-22 LAB — CBG MONITORING, ED
Glucose-Capillary: 555 mg/dL (ref 70–99)
Glucose-Capillary: 433 mg/dL — ABNORMAL HIGH (ref 70–99)

## 2022-02-22 LAB — I-STAT BETA HCG BLOOD, ED (MC, WL, AP ONLY): I-stat hCG, quantitative: <5 m[IU]/mL (ref <5)

## 2022-02-22 LAB — HEPARIN LEVEL (UNFRACTIONATED): Heparin Unfractionated: <0.1 IU/mL — ABNORMAL LOW (ref 0.30–0.70)

## 2022-02-22 LAB — HEPATIC FUNCTION PANEL
ALT: 29 U/L (ref 0–44)
AST: 33 U/L (ref 15–41)
Albumin: 3.2 g/dL — ABNORMAL LOW (ref 3.5–5.0)
Alkaline Phosphatase: 122 U/L (ref 38–126)
Bilirubin, Direct: 0.4 mg/dL — ABNORMAL HIGH (ref 0.0–0.2)
Indirect Bilirubin: 2 mg/dL — ABNORMAL HIGH (ref 0.3–0.9)
Total Bilirubin: 2.4 mg/dL — ABNORMAL HIGH (ref 0.3–1.2)
Total Protein: 7.8 g/dL (ref 6.5–8.1)

## 2022-02-22 LAB — BRAIN NATRIURETIC PEPTIDE: B Natriuretic Peptide: 443.3 pg/mL — ABNORMAL HIGH (ref 0.0–100.0)

## 2022-02-22 LAB — APTT: aPTT: 22 seconds — ABNORMAL LOW (ref 24–36)

## 2022-02-22 MED ORDER — MAGNESIUM SULFATE 2 GM/50ML IV SOLN
2.0000 g | Freq: Once | INTRAVENOUS | Status: AC
  Administered 2022-02-23: 2 g via INTRAVENOUS
  Filled 2022-02-22: qty 50

## 2022-02-22 MED ORDER — ONDANSETRON HCL 4 MG/2ML IJ SOLN
4.0000 mg | Freq: Four times a day (QID) | INTRAMUSCULAR | Status: DC | PRN
  Administered 2022-02-23 – 2022-02-27 (×13): 4 mg via INTRAVENOUS
  Filled 2022-02-22 (×13): qty 2

## 2022-02-22 MED ORDER — MORPHINE SULFATE (PF) 4 MG/ML IV SOLN
3.0000 mg | INTRAVENOUS | Status: DC | PRN
  Administered 2022-02-23 – 2022-02-28 (×12): 3 mg via INTRAVENOUS
  Filled 2022-02-22 (×12): qty 1

## 2022-02-22 MED ORDER — SODIUM CHLORIDE (PF) 0.9 % IJ SOLN
INTRAMUSCULAR | Status: AC
  Filled 2022-02-22: qty 50

## 2022-02-22 MED ORDER — INSULIN ASPART 100 UNIT/ML IJ SOLN
10.0000 [IU] | Freq: Once | INTRAMUSCULAR | Status: AC
  Administered 2022-02-22: 10 [IU] via SUBCUTANEOUS
  Filled 2022-02-22: qty 0.1

## 2022-02-22 MED ORDER — CARVEDILOL 3.125 MG PO TABS
3.1250 mg | ORAL_TABLET | Freq: Two times a day (BID) | ORAL | Status: DC
  Administered 2022-02-23 – 2022-02-28 (×12): 3.125 mg via ORAL
  Filled 2022-02-22 (×12): qty 1

## 2022-02-22 MED ORDER — ACETAMINOPHEN 650 MG RE SUPP: 650.0000 mg | Freq: Four times a day (QID) | RECTAL | Status: DC | PRN

## 2022-02-22 MED ORDER — INSULIN ASPART 100 UNIT/ML IJ SOLN
0.0000 [IU] | Freq: Three times a day (TID) | INTRAMUSCULAR | Status: DC
  Administered 2022-02-23: 3 [IU] via SUBCUTANEOUS
  Administered 2022-02-23: 8 [IU] via SUBCUTANEOUS
  Administered 2022-02-24: 5 [IU] via SUBCUTANEOUS
  Administered 2022-02-24: 3 [IU] via SUBCUTANEOUS
  Administered 2022-02-25: 2 [IU] via SUBCUTANEOUS
  Administered 2022-02-25: 3 [IU] via SUBCUTANEOUS
  Administered 2022-02-27: 2 [IU] via SUBCUTANEOUS
  Administered 2022-02-27: 3 [IU] via SUBCUTANEOUS
  Administered 2022-02-28 (×2): 2 [IU] via SUBCUTANEOUS
  Filled 2022-02-22: qty 0.15

## 2022-02-22 MED ORDER — VENLAFAXINE HCL ER 37.5 MG PO CP24
37.5000 mg | ORAL_CAPSULE | Freq: Every day | ORAL | Status: DC
  Administered 2022-02-23 – 2022-02-28 (×6): 37.5 mg via ORAL
  Filled 2022-02-22 (×6): qty 1

## 2022-02-22 MED ORDER — HYDROCODONE-ACETAMINOPHEN 5-325 MG PO TABS
1.0000 | ORAL_TABLET | Freq: Once | ORAL | Status: AC
  Administered 2022-02-22: 1 via ORAL
  Filled 2022-02-22: qty 1

## 2022-02-22 MED ORDER — ONDANSETRON HCL 4 MG/2ML IJ SOLN
4.0000 mg | Freq: Once | INTRAMUSCULAR | Status: AC
  Administered 2022-02-22: 4 mg via INTRAVENOUS
  Filled 2022-02-22: qty 2

## 2022-02-22 MED ORDER — INSULIN ASPART 100 UNIT/ML IJ SOLN
10.0000 [IU] | Freq: Once | INTRAMUSCULAR | Status: DC
  Filled 2022-02-22: qty 0.1

## 2022-02-22 MED ORDER — FUROSEMIDE 10 MG/ML IJ SOLN
80.0000 mg | Freq: Two times a day (BID) | INTRAMUSCULAR | Status: DC
  Administered 2022-02-23: 80 mg via INTRAVENOUS
  Filled 2022-02-22: qty 8

## 2022-02-22 MED ORDER — HEPARIN (PORCINE) 25000 UT/250ML-% IV SOLN
900.0000 [IU]/h | INTRAVENOUS | Status: DC
  Administered 2022-02-22: 750 [IU]/h via INTRAVENOUS
  Filled 2022-02-22: qty 250

## 2022-02-22 MED ORDER — FUROSEMIDE 10 MG/ML IJ SOLN
80.0000 mg | Freq: Once | INTRAMUSCULAR | Status: AC
  Administered 2022-02-22: 80 mg via INTRAVENOUS
  Filled 2022-02-22: qty 8

## 2022-02-22 MED ORDER — HEPARIN BOLUS VIA INFUSION
3800.0000 [IU] | Freq: Once | INTRAVENOUS | Status: AC
  Administered 2022-02-22: 3800 [IU] via INTRAVENOUS
  Filled 2022-02-22: qty 3800

## 2022-02-22 MED ORDER — IOHEXOL 350 MG/ML SOLN
100.0000 mL | Freq: Once | INTRAVENOUS | Status: AC | PRN
  Administered 2022-02-22: 100 mL via INTRAVENOUS

## 2022-02-22 MED ORDER — INSULIN ASPART 100 UNIT/ML IJ SOLN
0.0000 [IU] | Freq: Every day | INTRAMUSCULAR | Status: DC
  Administered 2022-02-23: 5 [IU] via SUBCUTANEOUS
  Administered 2022-02-25 – 2022-02-26 (×2): 2 [IU] via SUBCUTANEOUS
  Filled 2022-02-22: qty 0.05

## 2022-02-22 MED ORDER — ACETAMINOPHEN 325 MG PO TABS: 650.0000 mg | ORAL_TABLET | Freq: Four times a day (QID) | ORAL | Status: DC | PRN

## 2022-02-22 MED ORDER — MORPHINE SULFATE (PF) 4 MG/ML IV SOLN
4.0000 mg | Freq: Once | INTRAVENOUS | Status: AC
  Administered 2022-02-22: 4 mg via INTRAVENOUS
  Filled 2022-02-22: qty 1

## 2022-02-22 MED ORDER — INSULIN GLARGINE-YFGN 100 UNIT/ML ~~LOC~~ SOLN
30.0000 [IU] | Freq: Every day | SUBCUTANEOUS | Status: DC
  Administered 2022-02-23 – 2022-02-28 (×6): 30 [IU] via SUBCUTANEOUS
  Filled 2022-02-22 (×6): qty 0.3

## 2022-02-22 MED ORDER — ASPIRIN 325 MG PO TABS
325.0000 mg | ORAL_TABLET | Freq: Once | ORAL | Status: AC
  Administered 2022-02-22: 325 mg via ORAL
  Filled 2022-02-22: qty 1

## 2022-02-22 MED ORDER — NALOXONE HCL 0.4 MG/ML IJ SOLN: 0.4000 mg | INTRAMUSCULAR | Status: DC | PRN

## 2022-02-22 MED ORDER — DIGOXIN 125 MCG PO TABS
0.1250 mg | ORAL_TABLET | Freq: Every day | ORAL | Status: DC
  Administered 2022-02-23 – 2022-02-28 (×6): 0.125 mg via ORAL
  Filled 2022-02-22 (×7): qty 1

## 2022-02-22 MED ORDER — EMPAGLIFLOZIN 10 MG PO TABS
10.0000 mg | ORAL_TABLET | Freq: Every day | ORAL | Status: DC
  Administered 2022-02-23 – 2022-02-28 (×6): 10 mg via ORAL
  Filled 2022-02-22 (×6): qty 1

## 2022-02-22 NOTE — H&P (Signed)
History and Physical    PLEASE NOTE THAT DRAGON DICTATION SOFTWARE WAS USED IN THE CONSTRUCTION OF THIS NOTE.   [name] LYY:[id number] DOB: 01-13-1976 DOA: 02/22/2022  PCP: Darci Current, DO  Patient coming from: home   I have personally briefly reviewed patient's old medical records in [place]  Chief Complaint: Shortness of breath  HPI: [name] is a 46 y.o. female with medical history significant for chronic systolic/diastolic heart failure 50 with LVEF 2025% felt to be nonischemic cardiomyopathy, paroxysmal atrial fibrillation on Xarelto, type 2 diabetes mellitus, anemia of chronic disease associated baseline hemoglobin 11-13, essential hypertension, who is admitted to [facility] on 02/22/2022 with acute on chronic systolic/diastolic heart failure after presenting from home to [facility] ED complaining of shortness of breath.   The patient reports 3 weeks of progressive shortness of breath associated with orthopnea, worsening peripheral edema.  Over that timeframe, she is also noted development of generalized abdominal discomfort associated with intermittent nausea resulting in nonbloody, nonbilious emesis, with most recent such episode occurring earlier today.  She reports experiencing similar abdominal discomfort associated nausea/vomiting with a previous heart failure exacerbation.  She denies any associated diarrhea, melena, hematochezia.  No associated subjective fever, chills, rigors, or generalized myalgias.  She is also noted, over the last 3 weeks, intermittent substernal chest pressure, worsens with exertion.  Nonpositional, nonpleuritic, not reproducible with direct patient over the anterior chest wall.  No hemoptysis.  No recent trauma.  In the setting of the aforementioned recent nausea/vomiting, the patient reports that she is missed the majority of her doses of oral medications over the course of the last 5 days, while continuing to take her basal and short  acting insulin.  Included in this, she confirms that she has missed several of her most recent doses of Xarelto, which she takes in the context of a history of paroxysmal atrial fibrillation.  She has a documented history of chronic systolic/diastolic heart failure felt to be the basis of nonischemic cardiomyopathy after most recent right/left heart cath in February 2023 at [facility].  Most recent echocardiogram on 11/02/2021 was notable for LVEF 20 to 12%, grade 3 diastolic dysfunction, and normal right ventricular systolic function.  Outpatient diuretic regimen includes torsemide 40 mg p.o. twice daily as well as spironolactone, noting that she has been missing several doses of these diuretic meds over the last few days as well.  Over the last 4 to 5 days, the patient notes further worsening of the shortness of breath that she has been experiencing over the last 3 weeks, ultimately prompting her to present to [facility] emergency department today for further evaluation and management.     ED Course:  Vital signs in the ED were notable for the following: Afebrile; heart rate 1 14-1 19; initial blood pressure 175/118, with ensuing decreased to 158/94 following initiation of IV Lasix, as further detailed below.  Respiratory rate 18-24, oxygen saturation 97% on room air.  Labs were notable for the following: CMP notable for the following: Sodium 128, which corrects to 136 when taking into account concomitant hyperglycemia, potassium 4.2, bicarbonate 17, anion gap 15, creatinine 1.02 comparedto prior value of 0.82 on 02/10/2022, glucose 585, total bilirubin 2.4, of which 2.0 is indirect, which is relative to total bilirubin of 1.8 on 02/10/2022, otherwise, liver enzymes within normal limits.  BNP 443 compared to 296 on 01/28/2022.  Magnesium level 1.7.  High-sensitivity troponin I initially noted to be 161, with repeat  value trending down to 155.  This is relative to most recent prior high-sensitivity troponin  I value of 10 on 11/01/2021 and is the highest high-sensitivity troponin I value noted since a value of 100 in March 2021.  CBC notable for evidence of 10,900, hemoglobin 11.4 compared to 11.7 on 01/28/2022.  Imaging and additional notable ED work-up: EKG shows sinus tachycardia with heart rate 119, normal intervals, nonspecific T wave inversion in aVL, and no evidence of ST changes, including no evidence of ST elevation.  In the context of tachycardia, chest pain, and recently missed doses of her Xarelto, CTA chest was pursued and showed no evidence of acute pulmonary embolism, showing hypodensity/filling defect within the left ventricle apex, with associated recommendation for echocardiogram to exclude small volume thrombus or mass.  CT abdomen/pelvis showed heterogeneous enhancement of the liver consistent with passive congestion, but otherwise showed no evidence of acute intra-abdominal process.  The EDP discussed the patient's case with the on-call [facility] cardiologist who recommended admission to the hospitalist service at [facility] for further evaluation management of acute on chronic systolic/diastolic heart failure, with recommendation for ongoing IV diuresis.  Additionally, in the setting of elevated troponin, cardiology also recommended initiation of heparin drip and conveyed that they would formally consult on the patient at [facility] on the morning of 02-23-22.  While in the ED, the following were administered: Full dose aspirin x1, Lasix 80 mg IV x1, heparin bolus followed by heparin drip, Norco 5/305 mg p.o. x1, morphine 4 mg IV x1, Zofran 4 mg IV x1.  Subsequently, the patient was admitted for further evaluation management of presenting acute on chronic systolic/diastolic heart failure as well as suspected type II NSTEMI.    Review of Systems: As per HPI otherwise 10 point review of systems negative.   Past Medical History:  Diagnosis Date   Abnormal Pap smear    Acute on chronic  combined systolic and diastolic CHF (congestive heart failure) ([place]) 02/19/2019   Acute on chronic congestive heart failure (HCC)    Acute on chronic congestive heart failure (HCC)    Acute on chronic diastolic CHF (congestive heart failure) ([place]) 10/19/2021   Anemia    Asthma    Atrial fibrillation ([place])    history of paroxysmal atrial fibrillation   CHF exacerbation ([place]) 10/17/2021   Diabetes mellitus    DKA, type 2 ([place]) 02/06/2019   Elevated troponin 09/30/2019   Heart failure with reduced ejection fraction ([place])    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  Consider chronic etiology given continued fertility issues   Hypertension    Hypertensive emergency 09/30/2019   Major depressive disorder 02/08/2012   Reports that 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     Migraines    Morbid obesity ([place]) 02/06/2019   Nonischemic cardiomyopathy ([place]) 09/22/2021   Obstructive sleep apnea 06/30/2015   Polycystic ovarian disease     Past Surgical History:  Procedure Laterality Date   BIOPSY  11/05/2021   Procedure: BIOPSY;  Surgeon: Daryel November, MD;  Location: [facility] ENDOSCOPY;  Service: Gastroenterology;;   CERVIX LESION DESTRUCTION     CESAREAN SECTION     ESOPHAGOGASTRODUODENOSCOPY (EGD) WITH PROPOFOL N/A 11/05/2021   Procedure: ESOPHAGOGASTRODUODENOSCOPY (EGD) WITH PROPOFOL;  Surgeon: Daryel November, MD;  Location: [place];  Service: Gastroenterology;  Laterality: N/A;   RIGHT/LEFT  HEART CATH AND CORONARY ANGIOGRAPHY N/A 03/29/2019   Procedure: RIGHT/LEFT HEART CATH AND CORONARY ANGIOGRAPHY;  Surgeon: Larey Dresser, MD;  Location: [place] CV LAB;  Service: Cardiovascular;  Laterality: N/A;   RIGHT/LEFT HEART CATH AND CORONARY ANGIOGRAPHY N/A 09/20/2021   Procedure: RIGHT/LEFT HEART CATH AND CORONARY ANGIOGRAPHY;  Surgeon: Larey Dresser, MD;  Location:  [place] CV LAB;  Service: Cardiovascular;  Laterality: N/A;    Social History:  reports that she has never smoked. She has never used smokeless tobacco. She reports that she does not drink alcohol and does not use drugs.   Allergies  Allergen Reactions   Shellfish Allergy Swelling    Airway involvement including EMS - Has Epi-Pen   Metformin And Related Diarrhea   Trulicity [Dulaglutide] Nausea And Vomiting and Other (See Comments)    Multiple episodes of vomiting over multiple days   Ibuprofen Other (See Comments)    Per PCP interferes with daily meds (heart failure)    Family History  Problem Relation Age of Onset   Diabetes Mother    Hypertension Mother    Hypertension Father    Diabetes Father     Family history reviewed and not pertinent    Prior to Admission medications   Medication Sig Start Date End Date Taking? Authorizing Provider  albuterol (VENTOLIN HFA) 108 (90 Base) MCG/ACT inhaler Inhale 2 puffs into the lungs every 6 (six) hours as needed for wheezing or shortness of breath. 09/16/21 09/16/22 Yes Lilland, Alana, DO  carvedilol (COREG) 6.25 MG tablet Take 1 tablet (6.25 mg total) by mouth 2 (two) times daily with a meal. 09/22/21  Yes Gladys Damme, MD  digoxin (LANOXIN) 0.125 MG tablet Take 1 tablet (0.125 mg total) by mouth daily. 11/08/21  Yes Sharion Settler, DO  empagliflozin (JARDIANCE) 10 MG TABS tablet Take 1 tablet (10 mg total) by mouth daily. 11/08/21  Yes Espinoza, Dawson Bills, DO  insulin aspart (NOVOLOG FLEXPEN) 100 UNIT/ML FlexPen Inject 12 Units into the skin daily before supper. 09/02/21  Yes Hensel, Jamal Collin, MD  insulin glargine (LANTUS) 100 UNIT/ML Solostar Pen Inject 36 Units into the skin every morning. 09/02/21  Yes Hensel, Jamal Collin, MD  naproxen (NAPROSYN) 500 MG tablet Take up to twice a day as needed for severe pain.  Not take every day. 02/10/22  Yes Chambliss, Jeb Levering, MD  ondansetron (ZOFRAN) 4 MG tablet Take 1 tablet (4 mg total)  by mouth every 8 (eight) hours as needed for nausea or vomiting. 02/10/22  Yes Darci Current, DO  rivaroxaban (XARELTO) 20 MG TABS tablet Take 1 tablet (20 mg total) by mouth daily with supper. 10/19/21  Yes Wells Guiles, DO  spironolactone (ALDACTONE) 25 MG tablet Take 0.5 tablets (12.5 mg total) by mouth daily. 11/08/21  Yes Sharion Settler, DO  torsemide (DEMADEX) 20 MG tablet Take 2 tablets (40 mg total) by mouth 2 (two) times daily. 11/08/21  Yes Sharion Settler, DO  venlafaxine XR (EFFEXOR XR) 37.5 MG 24 hr capsule Take 1 capsule (37.5 mg total) by mouth daily with breakfast. 02/10/22  Yes Chambliss, Jeb Levering, MD  Accu-Chek Softclix Lancets lancets Use as instructed 02/12/19   Caroline More, DO  atorvastatin (LIPITOR) 40 MG tablet TAKE 1 TABLET BY MOUTH DAILY AT 6 PM. Patient not taking: Reported on 02/22/2022 04/01/20 08/27/22  Larey Dresser, MD  blood glucose meter kit and supplies KIT Dispense based on patient and insurance preference. Use up to four times daily as directed. (FOR ICD-9  250.00, 250.01). 09/27/21   Autry-Lott, Naaman Plummer, DO  Blood Glucose Monitoring Suppl (ACCU-CHEK AVIVA PLUS) w/Device KIT 1 application by Does not apply route QID. Check fasting sugars as well as throughout the day 02/12/19   Caroline More, DO  glucose blood (ACCU-CHEK AVIVA PLUS) test strip Check CBG 2 times per day 08/16/19   [name], DO  glucose blood (AGAMATRIX PRESTO TEST) test strip Use as instructed 10/23/17   Zenia Resides, MD  hydrOXYzine (ATARAX) 10 MG tablet Take 1 tablet (10 mg total) by mouth 3 (three) times daily as needed. Patient taking differently: Take 10 mg by mouth 3 (three) times daily as needed for anxiety. 10/22/21   Gladys Damme, MD  Insulin Pen Needle (UNIFINE PENTIPS) 31G X 5 MM MISC USE AS DIRECTED 2 TIMES DAILY 09/02/21   Zenia Resides, MD  Insulin Syringe-Needle U-100 (INSULIN SYRINGE .3CC/31GX5/16") 31G X 5/16" 0.3 ML MISC 15 Units by Does not apply route 2 (two) times  daily. 09/05/20   Rai, Vernelle Emerald, MD  Lancets Misc. (ACCU-CHEK SOFTCLIX LANCET DEV) KIT 1 application by Does not apply route 2 (two) times daily. 08/16/19   [name], DO  metoCLOPramide (REGLAN) 5 MG tablet Take 1 tablet (5 mg total) by mouth every 8 (eight) hours as needed for nausea. Patient not taking: Reported on 02/22/2022 11/05/21 11/05/22  Erskine Emery, MD     Objective    Physical Exam: Vitals:   02/22/22 2045 02/22/22 2100 02/22/22 2115 02/22/22 2200  BP:      Pulse: (!) 107 (!) 104 (!) 107   Resp: (!) 26 15 (!) 21   Temp:    97.8 F (36.6 C)  TempSrc:    Oral  SpO2: 100% 100% 99%   Weight:      Height:        General: appears to be stated age; alert, oriented; mildly increased work of breathing noted Skin: warm, dry, no rash Head:  AT/[place] Mouth:  Oral mucosa membranes appear moist, normal dentition Neck: supple; trachea midline Heart: Tachycardic, but regular; did not appreciate any M/R/G Lungs: CTAB, did not appreciate any wheezes, rales, or rhonchi Abdomen: + BS; soft, ND, NT Vascular: 2+ pedal pulses b/l; 2+ radial pulses b/l Extremities: 1-2+ edema in the bilateral lower extremities, no muscle wasting Neuro: strength and sensation intact in upper and lower extremities b/l      Labs on Admission: I have personally reviewed following labs and imaging studies  CBC: Recent Labs  Lab 02/22/22 1739  WBC 10.9*  NEUTROABS 7.7  HGB 11.4*  HCT 35.6*  MCV 76.4*  PLT 836   Basic Metabolic Panel: Recent Labs  Lab 02/22/22 1706 02/22/22 1921  NA 128*  --   K 4.2  --   CL 96*  --   CO2 17*  --   GLUCOSE 585*  --   BUN 12  --   CREATININE 1.02*  --   CALCIUM 8.8*  --   MG  --  1.7   GFR: Estimated Creatinine Clearance: 67.1 mL/min (A) (by C-G formula based on SCr of 1.02 mg/dL (H)). Liver Function Tests: Recent Labs  Lab 02/22/22 1921  AST 33  ALT 29  ALKPHOS 122  BILITOT 2.4*  PROT 7.8  ALBUMIN 3.2*   No results for input(s): "LIPASE",  "AMYLASE" in the last 168 hours. No results for input(s): "AMMONIA" in the last 168 hours. Coagulation Profile: No results for input(s): "INR", "PROTIME" in the last 168 hours.  Cardiac Enzymes: No results for input(s): "CKTOTAL", "CKMB", "CKMBINDEX", "TROPONINI" in the last 168 hours. BNP (last 3 results) No results for input(s): "PROBNP" in the last 8760 hours. HbA1C: No results for input(s): "HGBA1C" in the last 72 hours. CBG: Recent Labs  Lab 02/22/22 2110 02/22/22 2232  GLUCAP 555* 433*   Lipid Profile: No results for input(s): "CHOL", "HDL", "LDLCALC", "TRIG", "CHOLHDL", "LDLDIRECT" in the last 72 hours. Thyroid Function Tests: No results for input(s): "TSH", "T4TOTAL", "FREET4", "T3FREE", "THYROIDAB" in the last 72 hours. Anemia Panel: No results for input(s): "VITAMINB12", "FOLATE", "FERRITIN", "TIBC", "IRON", "RETICCTPCT" in the last 72 hours. Urine analysis:    Component Value Date/Time   COLORURINE AMBER (A) 11/02/2021 0321   APPEARANCEUR HAZY (A) 11/02/2021 0321   LABSPEC 1.031 (H) 11/02/2021 0321   PHURINE 5.0 11/02/2021 0321   GLUCOSEU NEGATIVE 11/02/2021 0321   HGBUR NEGATIVE 11/02/2021 0321   BILIRUBINUR NEGATIVE 11/02/2021 0321   BILIRUBINUR NEG 04/05/2013 1057   KETONESUR 5 (A) 11/02/2021 0321   PROTEINUR 30 (A) 11/02/2021 0321   UROBILINOGEN 0.2 08/09/2013 2031   NITRITE NEGATIVE 11/02/2021 0321   LEUKOCYTESUR NEGATIVE 11/02/2021 0321    Radiological Exams on Admission: CT Angio Chest PE W and/or Wo Contrast  Result Date: 02/22/2022 CLINICAL DATA:  Abdomen pain chest pain nausea vomiting short of breath EXAM: CT ANGIOGRAPHY CHEST CT ABDOMEN AND PELVIS WITH CONTRAST TECHNIQUE: Multidetector CT imaging of the chest was performed using the standard protocol during bolus administration of intravenous contrast. Multiplanar CT image reconstructions and MIPs were obtained to evaluate the vascular anatomy. Multidetector CT imaging of the abdomen and pelvis was  performed using the standard protocol during bolus administration of intravenous contrast. RADIATION DOSE REDUCTION: This exam was performed according to the departmental dose-optimization program which includes automated exposure control, adjustment of the mA and/or kV according to patient size and/or use of iterative reconstruction technique. CONTRAST:  151m OMNIPAQUE IOHEXOL 350 MG/ML SOLN COMPARISON:  Chest x-ray 02/22/2022, CT abdomen pelvis 11/02/2021, CT chest 09/17/2021 FINDINGS: CTA CHEST FINDINGS Cardiovascular: Satisfactory opacification of the pulmonary arteries to the segmental level. No evidence of pulmonary embolism. Cardiomegaly. No pericardial effusion. Nonaneurysmal aorta. On the CT abdomen pelvis images, there is an oval hypodensity near the left ventricular apex. Mediastinum/Nodes: No enlarged mediastinal, hilar, or axillary lymph nodes. Thyroid gland, trachea, and esophagus demonstrate no significant findings. Lungs/Pleura: Lungs are clear. No pleural effusion or pneumothorax. Musculoskeletal: No chest wall abnormality. No acute or significant osseous findings. Review of the MIP images confirms the above findings. CT ABDOMEN and PELVIS FINDINGS Hepatobiliary: Heterogenous liver attenuation. No focal hepatic abnormality. No calcified gallstone or biliary dilatation. Pancreas: Unremarkable. No pancreatic ductal dilatation or surrounding inflammatory changes. Spleen: Normal in size without focal abnormality. Adrenals/Urinary Tract: Adrenal glands are normal. Kidneys show no hydronephrosis. Cyst in the right kidney. Subcentimeter hypodensities in the left kidney too small to further characterize. The bladder is normal Stomach/Bowel: The stomach is nonenlarged. No dilated small bowel. Negative appendix. No acute bowel wall thickening Vascular/Lymphatic: Mild aortic atherosclerosis. No aneurysm. No suspicious lymph nodes. Reproductive: Prominent uterine fundus. No suspicious adnexal mass. Other:  Negative for pelvic effusion or free air. Mild generalized subcutaneous edema. Musculoskeletal: No acute osseous abnormality. Review of the MIP images confirms the above findings. IMPRESSION: 1. Negative for acute pulmonary embolus. Ovoid hypodensity/filling defect within the left ventricular apex seen best on the abdominal images, recommend correlation with echocardiogram to exclude small volume thrombus or mass. 2. Cardiomegaly with reflux of contrast into  the hepatic veins consistent with elevated right heart pressures. 3. Heterogenous enhancement of the liver likely due to passive congestion. 4. Mild generalized subcutaneous edema. Electronically Signed   By: Donavan Foil M.D.   On: 02/22/2022 20:34   CT ABDOMEN PELVIS W CONTRAST  Result Date: 02/22/2022 CLINICAL DATA:  Abdomen pain chest pain nausea vomiting short of breath EXAM: CT ANGIOGRAPHY CHEST CT ABDOMEN AND PELVIS WITH CONTRAST TECHNIQUE: Multidetector CT imaging of the chest was performed using the standard protocol during bolus administration of intravenous contrast. Multiplanar CT image reconstructions and MIPs were obtained to evaluate the vascular anatomy. Multidetector CT imaging of the abdomen and pelvis was performed using the standard protocol during bolus administration of intravenous contrast. RADIATION DOSE REDUCTION: This exam was performed according to the departmental dose-optimization program which includes automated exposure control, adjustment of the mA and/or kV according to patient size and/or use of iterative reconstruction technique. CONTRAST:  129m OMNIPAQUE IOHEXOL 350 MG/ML SOLN COMPARISON:  Chest x-ray 02/22/2022, CT abdomen pelvis 11/02/2021, CT chest 09/17/2021 FINDINGS: CTA CHEST FINDINGS Cardiovascular: Satisfactory opacification of the pulmonary arteries to the segmental level. No evidence of pulmonary embolism. Cardiomegaly. No pericardial effusion. Nonaneurysmal aorta. On the CT abdomen pelvis images, there is an  oval hypodensity near the left ventricular apex. Mediastinum/Nodes: No enlarged mediastinal, hilar, or axillary lymph nodes. Thyroid gland, trachea, and esophagus demonstrate no significant findings. Lungs/Pleura: Lungs are clear. No pleural effusion or pneumothorax. Musculoskeletal: No chest wall abnormality. No acute or significant osseous findings. Review of the MIP images confirms the above findings. CT ABDOMEN and PELVIS FINDINGS Hepatobiliary: Heterogenous liver attenuation. No focal hepatic abnormality. No calcified gallstone or biliary dilatation. Pancreas: Unremarkable. No pancreatic ductal dilatation or surrounding inflammatory changes. Spleen: Normal in size without focal abnormality. Adrenals/Urinary Tract: Adrenal glands are normal. Kidneys show no hydronephrosis. Cyst in the right kidney. Subcentimeter hypodensities in the left kidney too small to further characterize. The bladder is normal Stomach/Bowel: The stomach is nonenlarged. No dilated small bowel. Negative appendix. No acute bowel wall thickening Vascular/Lymphatic: Mild aortic atherosclerosis. No aneurysm. No suspicious lymph nodes. Reproductive: Prominent uterine fundus. No suspicious adnexal mass. Other: Negative for pelvic effusion or free air. Mild generalized subcutaneous edema. Musculoskeletal: No acute osseous abnormality. Review of the MIP images confirms the above findings. IMPRESSION: 1. Negative for acute pulmonary embolus. Ovoid hypodensity/filling defect within the left ventricular apex seen best on the abdominal images, recommend correlation with echocardiogram to exclude small volume thrombus or mass. 2. Cardiomegaly with reflux of contrast into the hepatic veins consistent with elevated right heart pressures. 3. Heterogenous enhancement of the liver likely due to passive congestion. 4. Mild generalized subcutaneous edema. Electronically Signed   By: KDonavan FoilM.D.   On: 02/22/2022 20:34   DG Chest 2 View  Result Date:  02/22/2022 CLINICAL DATA:  Shortness of breath EXAM: CHEST - 2 VIEW COMPARISON:  01/28/2022 FINDINGS: Unchanged cardiomegaly. Mild bibasilar atelectasis, similar to the prior. No new focal pulmonary opacity. No pleural effusion or pneumothorax. No acute osseous abnormality. IMPRESSION: Cardiomegaly without additional acute cardiopulmonary process. Electronically Signed   By: AMerilyn BabaM.D.   On: 02/22/2022 18:06     EKG: Independently reviewed, with result as described above.    Assessment/Plan    Principal Problem:   Acute on chronic systolic heart failure (HCC) Active Problems:   Poorly controlled type 2 diabetes mellitus (HCC)   Anemia of chronic disease   Paroxysmal atrial fibrillation (HCC)   Hyperglycemia  Abdominal pain   Depression, recurrent ([place])   NSTEMI (non-ST elevated myocardial infarction) ([place])      #) Acute on chronic systolic/diastolic heart failure: In the context of a known history of chronic systolic/diastolic heart failure, felt to be representative of non-ischemic cardiomyopathy following most recent right/left heart cath in February 2023, with most recent echocardiogram in April 2023 notable for LVEF 20 to 25% as well as grade 3 diastolic dysfunction, presentation to be consistent with exacerbation thereof on the basis of progressive shortness of breath associated with orthopnea, and interval elevation of BNP.  Suspect contribution from suboptimal compliance with outpatient diuretic medications as well as her afterload reduction medications.  ACS less likely in the setting of a documented history of nonischemic cardiomyopathy, will continue to trend troponin and pursue echocardiogram the morning, as further detailed below.   EDP discussed patient's case with the on-call [facility] cardiologist, who recommended additional IV diuresis, and will formally consult and see the patient at [facility] on the morning of 02-23-22.   Of note, her outpatient diuretic regimen  includes torsemide as well as spironolactone.  Her additional heart failure medications include Coreg as well as empagliflozin does not appear to be on an ACE inhibitor or an ARB at this time.  Given the patient's suboptimal compliance with her medications, I will reduce the dose of her Coreg upon this evening's resumption thereof.   Plan: Monitor strict I's and O's and daily weights.  Lasix 80 mg IV twice daily.  Magnesium sulfate 2 g IV over 2 hours x 1 dose now.  Repeat CMP/serum magnesium level in the morning.  Cardiology consulted and will see the patient in the morning.  Echo in the morning.  Continue to trend troponin.  Resumption of Coreg, but at half outpatient dose, as above.  Resume empagliflozin.  Emphasized the importance of improved compliance with her outpatient medications.        #) NSTEMI: In the setting of mildly elevated initial troponin of 161, with repeat value trending down to 155, relative to most recent prior value of 10 in April 2023.  EKG shows sinus tachycardia without overt acute ischemic changes, including no evidence of ST elevation.  Patient has been complaining of some recent exertional chest discomfort, but in the context of coronary angiography in February 2023 showing no evidence of obstructive CAD, a type I process would appear to be less likely.  Suspect the patient's mildly elevated troponin is on the basis of a type II supply/demand mismatch as a consequence of her presenting acute on chronic systolic/diastolic heart failure as well as influences from suboptimal afterload reduction and presenting sinus tachycardia.  Of note, CTA chest shows no evidence of acute PE.  As described above, case discussed with cardiology, recommending initiation heparin drip, with plan for cardiology to see the patient at [facility] in the morning.  Plan: Heparin drip.  Cardiology consulted, as above.  Trend troponin.  Echo ordered for the morning.  Monitor on symmetry.  Magnesium  supplementation as described above.  Repeat CMP/CBC in the morning.  Further evaluation management of acute on chronic systolic/diastolic heart failure, including additional IV diuresis, as above.  Resume home beta-blocker, at half dose, as above.  Status post full dose aspirin x1.         #(Hyperglycemia: In the context of type 2 diabetes mellitus that appears poorly controlled with most recent hemoglobin A1c of 12.1% on 01/29/2022, presenting blood sugar noted to be 585, but in the  absence of any evidence of anion gap metabolic acidosis.  Overall, presentation does not appear consistent with DKA.  Hyperglycemic influences suspected to include suboptimal compliance with outpatient basal/short acting insulin as well as contribution from physiologic stress stemming from presenting acute on chronic systolic/diastolic heart failure.  Of note, outpatient basal insulin regimen noted to be Lantus 36 units subcu every morning, while she is also on scheduled NovoLog 10 units SQ daily.   Plan: We will resume majority of her outpatient basal insulin dose, specifically Lantus 38 SQ every morning.  NovoLog 10 units subcu x1 now followed by nightly CBG with associated sliding scale coverage.  Subsequently, we will pursue CBG monitoring and before every meal/at bedtime basis with moderate size scale insulin.  Repeat CMP in the morning.        #) Generalized abdominal discomfort: Suspect that this basis of congestive hepatopathy CT abdomen/pelvis showing evidence of massive hepatic congestion in setting of acute on chronic systolic/diastolic heart.  Otherwise, CT abdomen/pelvis showed no evidence of acute process.  Physical exam reveals no evidence of acute peritoneal signs.  An element of congestive hepatopathy/gastropathy would also be consistent with her concomitant reported intermittent nausea/vomiting as well as isolated mild hyperbilirubinemia which is indirect predominant, suggesting slight interval decline  in hepatic conjugating function.  will check INR to further assess associated synthetic function.  Plan: Further evaluation management of acute on chronic systolic/SI heart failure, including additional IV diuresis, as above.  Monitor strict I's and O's and daily weights.  Prn IV morphine.  As needed Zofran.  Repeat CMP in the morning.  Check INR.          #) Paroxysmal atrial fibrillation: Documented history of such. In setting of CHA2DS2-VASc score of  5 , there is an indication for chronic anticoagulation for thromboembolic prophylaxis. Consistent with this, patient is chronically anticoagulated on Xarelto, but with the caveat of several missed doses over the last few days due to nausea/vomiting over that time. Home AV nodal blocking regimen: Coreg.  She is also on digoxin.  Reflect sinus tachycardia without evidence of acute ischemic changes.  In the setting of presenting NSTEMI, cardiology has recommended initiation of heparin drip.  Additionally, will resume home Coreg, but at half dose in the setting of acute acute state heart failure.    Plan: monitor strict I's & O's and daily weights. Repeat CMP/CBC in AM.  2 g of IV magnesium sulfate or acute hours x1 dose now.  Repeat serum magnesium level the morning.  Resume home Coreg at half dose, as above.  Holding home Xarelto in view of current heparin drip, as above.  Continue home digoxin.  Monitor on telemetry.          #) Anemia of chronic disease: Documented history of such, a/w with baseline hgb range 11-13, with presenting hgb consistent with this range, in the absence of any overt evidence of active bleed.     Plan: Repeat CBC in the morning.  Check INR.         #) Depression: Documented history of such, on Wellbutrin as an outpatient.  EKG reflects no evidence of QTc prolongation.  Plan: Continue outpatient Wellbutrin.        DVT prophylaxis: Heparin drip Code Status: Full code Family Communication:  none Disposition Plan: Per Rounding Team Consults called: EDP has discussed case with on-call [facility] cardiology, who will formally consult and see the patient in the morning (8/2) at [facility], as further detailed above;  Admission  status: Inpatient   PLEASE NOTE THAT DRAGON DICTATION SOFTWARE WAS USED IN THE CONSTRUCTION OF THIS NOTE.   [place] DO [facility] [place]   02/22/2022, 11:28 PM

## 2022-02-22 NOTE — ED Provider Triage Note (Signed)
Emergency Medicine Provider Triage Evaluation Note  [name] , a 46 y.o. female  was evaluated in triage.  Pt complains of shortness of breath onset 2 weeks.  Has a history of asthma and has not used her inhaler.  Has bilateral lower extremity swelling.  Patient is currently taking Lasix for his CHF without relief of her symptoms. Has associated chest pain.  Notes that her shortness of breath and chest pain is worse and denies it being exacerbated with ambulation.  Denies OCP, HRT use, recent immobilization/surgery, history of DVT/PE  Review of Systems  Positive: As per HPI Negative:   Physical Exam  BP (!) 173/135 (BP Location: Left Arm)   Pulse (!) 119   Temp 98.4 F (36.9 C) (Oral)   Resp 18   Ht '4\' 11"'$  (1.499 m)   Wt 86.2 kg   LMP 02/02/2022   SpO2 95%   BMI 38.38 kg/m  Gen:   Awake, no distress  Resp:  Normal effort, unlabored breathing on exam MSK:   Moves extremities without difficulty, trace edema noted to bilateral lower extremities without tenderness to palpation. Other:    Medical Decision Making  Medically screening exam initiated at 5:05 PM.  Appropriate orders placed.  JARYN HOCUTT was informed that the remainder of the evaluation will be completed by another provider, this initial triage assessment does not replace that evaluation, and the importance of remaining in the ED until their evaluation is complete.  5:10 PM - Discussed with RN that patient is in need of a room immediately. RN aware and working on room placement.    Siarah Deleo A, PA-C 02/22/22 1711

## 2022-02-22 NOTE — ED Triage Notes (Signed)
Patient c/o [facility]. N/V, and chest pain beginning more than 2 weeks ago. Patient has hx of asthma. Patient also c/o swelling. Patient states she has taken lasix and used her inhaler without relief.

## 2022-02-22 NOTE — Progress Notes (Signed)
ANTICOAGULATION CONSULT NOTE - Initial Consult  Pharmacy Consult for heparin Indication: chest pain/ACS  Allergies  Allergen Reactions   Shellfish Allergy Swelling    Airway involvement including EMS - Has Epi-Pen   Metformin And Related Diarrhea   Trulicity [Dulaglutide] Nausea And Vomiting and Other (See Comments)    Multiple episodes of vomiting over multiple days   Ibuprofen Other (See Comments)    Per PCP interferes with daily meds (heart failure)    Patient Measurements: Height: '4\' 11"'$  (149.9 cm) Weight: 86.2 kg (190 lb) IBW/kg (Calculated) : 43.2 Heparin Dosing Weight: 64 kg  Vital Signs: Temp: 98.4 F (36.9 C) (08/01 1817) Temp Source: Oral (08/01 1817) BP: 165/120 (08/01 1845) Pulse Rate: 116 (08/01 1845)  Labs: Recent Labs    02/22/22 1706 02/22/22 1739  HGB  --  11.4*  HCT  --  35.6*  PLT  --  313  CREATININE 1.02*  --   TROPONINIHS 161*  --     Estimated Creatinine Clearance: 67.1 mL/min (A) (by C-G formula based on SCr of 1.02 mg/dL (H)).   Medical History: Past Medical History:  Diagnosis Date   Abnormal Pap smear    Acute on chronic combined systolic and diastolic CHF (congestive heart failure) ([place]) 02/19/2019   Acute on chronic congestive heart failure (HCC)    Acute on chronic congestive heart failure (HCC)    Acute on chronic diastolic CHF (congestive heart failure) ([place]) 10/19/2021   Anemia    Asthma    Atrial fibrillation ([place])    history of paroxysmal atrial fibrillation   CHF exacerbation ([place]) 10/17/2021   Diabetes mellitus    DKA, type 2 ([place]) 02/06/2019   Elevated troponin 09/30/2019   Heart failure with reduced ejection fraction ([place])    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  Consider chronic etiology given continued fertility issues   Hypertension    Hypertensive emergency 09/30/2019   Major depressive disorder 02/08/2012   Reports that her infertility is a major  cause of her depression Reports Prozac has worked in the past for her.     Migraines    Morbid obesity ([place]) 02/06/2019   Nonischemic cardiomyopathy ([place]) 09/22/2021   Obstructive sleep apnea 06/30/2015   Polycystic ovarian disease     Medications: Pt prescribed rivaroxaban 20 mg daily PTA  -Pt reports that she has not taken in the past 5 days  Assessment: Pt is a 29 yoF presenting with SOB, N/V, chest pain. PMH significant for atrial fibrillation for which she is prescribed Xarelto. Pharmacy consulted to dose heparin for ACS.  Today, 02/22/22 CBC: Hgb slightly low; Plt WNL Troponin: 161 SCr WNL STAT baseline HL and aPTT ordered. Baseline HL undetectable (consistent with pt missing several days of Xarelto). Able to monitor using HL only.   Goal of Therapy:  Heparin level 0.3-0.7 units/ml aPTT 66-102 seconds Monitor platelets by anticoagulation protocol: Yes   Plan:  Heparin bolus of 3800 units IV once Initiate heparin infusion at 750 units/hr Check 6 hour HL. Daily CBC, HL. Monitor for signs of bleeding.   Lenis Noon, PharmD 02/22/2022,7:11 PM

## 2022-02-22 NOTE — ED Provider Notes (Signed)
[facility] DEPT Provider Note   CSN: [id number] Arrival date & time: 02/22/22  1655     History  No chief complaint on file.   [name] is a 47 y.o. female.  [name] is a 46 year old female with past medical history significant for A-fib not compliant with her anticoagulation, CHF with EF of 20 to 25% presents today for 2 to 3-week duration of progressive worsening in dyspnea on exertion, peripheral edema both of which significantly worsened in the past 5 days associated with abdominal pain, nausea, and vomiting.  She states the abdominal pain, nausea and vomiting are not typical for her CHF exacerbation.  She states in the past 5 days she has not been able to keep any p.o. medications down.  Prior to that she states she was still somewhat noncompliant with her medications.  Currently she is tachycardic however in sinus rhythm.  States she has been feeling palpitations for at least 2 weeks.  Denies prior history of blood clot.  States the only medication she has been using is her insulin.  She also complains of generalized body ache.  The history is provided by the patient. No language interpreter was used.       Home Medications Prior to Admission medications   Medication Sig Start Date End Date Taking? Authorizing Provider  Accu-Chek Softclix Lancets lancets Use as instructed 02/12/19   Caroline More, DO  albuterol (VENTOLIN HFA) 108 (90 Base) MCG/ACT inhaler Inhale 2 puffs into the lungs every 6 (six) hours as needed for wheezing or shortness of breath. 09/16/21 09/16/22  Lilland, Lorrin Goodell, DO  atorvastatin (LIPITOR) 40 MG tablet TAKE 1 TABLET BY MOUTH DAILY AT 6 PM. 04/01/20 08/27/22  Larey Dresser, MD  blood glucose meter kit and supplies KIT Dispense based on patient and insurance preference. Use up to four times daily as directed. (FOR ICD-9 250.00, 250.01). 09/27/21   Autry-Lott, Naaman Plummer, DO  Blood Glucose Monitoring Suppl (ACCU-CHEK AVIVA PLUS)  w/Device KIT 1 application by Does not apply route QID. Check fasting sugars as well as throughout the day 02/12/19   Caroline More, DO  carvedilol (COREG) 6.25 MG tablet Take 1 tablet (6.25 mg total) by mouth 2 (two) times daily with a meal. 09/22/21   Gladys Damme, MD  digoxin (LANOXIN) 0.125 MG tablet Take 1 tablet (0.125 mg total) by mouth daily. 11/08/21   Sharion Settler, DO  empagliflozin (JARDIANCE) 10 MG TABS tablet Take 1 tablet (10 mg total) by mouth daily. 11/08/21   Sharion Settler, DO  glucose blood (ACCU-CHEK AVIVA PLUS) test strip Check CBG 2 times per day 08/16/19   [name], DO  glucose blood (AGAMATRIX PRESTO TEST) test strip Use as instructed 10/23/17   Zenia Resides, MD  hydrOXYzine (ATARAX) 10 MG tablet Take 1 tablet (10 mg total) by mouth 3 (three) times daily as needed. Patient taking differently: Take 10 mg by mouth 3 (three) times daily as needed for anxiety. 10/22/21   Gladys Damme, MD  insulin aspart (NOVOLOG FLEXPEN) 100 UNIT/ML FlexPen Inject 12 Units into the skin daily before supper. 09/02/21   Zenia Resides, MD  insulin glargine (LANTUS) 100 UNIT/ML Solostar Pen Inject 36 Units into the skin every morning. 09/02/21   Zenia Resides, MD  Insulin Pen Needle (UNIFINE PENTIPS) 31G X 5 MM MISC USE AS DIRECTED 2 TIMES DAILY 09/02/21   Zenia Resides, MD  Insulin Syringe-Needle U-100 (INSULIN SYRINGE .3CC/31GX5/16") 31G X 5/16" 0.3 ML  MISC 15 Units by Does not apply route 2 (two) times daily. 09/05/20   Rai, Vernelle Emerald, MD  Lancets Misc. (ACCU-CHEK SOFTCLIX LANCET DEV) KIT 1 application by Does not apply route 2 (two) times daily. 08/16/19   [name], DO  metoCLOPramide (REGLAN) 5 MG tablet Take 1 tablet (5 mg total) by mouth every 8 (eight) hours as needed for nausea. 11/05/21 11/05/22  Erskine Emery, MD  naproxen (NAPROSYN) 500 MG tablet Take up to twice a day as needed for severe pain.  Not take every day. 02/10/22   Lind Covert, MD   ondansetron (ZOFRAN) 4 MG tablet Take 1 tablet (4 mg total) by mouth every 8 (eight) hours as needed for nausea or vomiting. 02/10/22   Darci Current, DO  rivaroxaban (XARELTO) 20 MG TABS tablet Take 1 tablet (20 mg total) by mouth daily with supper. 10/19/21   Wells Guiles, DO  spironolactone (ALDACTONE) 25 MG tablet Take 0.5 tablets (12.5 mg total) by mouth daily. 11/08/21   Sharion Settler, DO  torsemide (DEMADEX) 20 MG tablet Take 2 tablets (40 mg total) by mouth 2 (two) times daily. 11/08/21   Sharion Settler, DO  venlafaxine XR (EFFEXOR XR) 37.5 MG 24 hr capsule Take 1 capsule (37.5 mg total) by mouth daily with breakfast. 02/10/22   Lind Covert, MD      Allergies    Shellfish allergy, Metformin and related, Trulicity [dulaglutide], and Ibuprofen    Review of Systems   Review of Systems  Constitutional:  Negative for chills and fever.  Respiratory:  Positive for shortness of breath.   Cardiovascular:  Positive for palpitations. Negative for chest pain.  Gastrointestinal:  Positive for abdominal pain, nausea and vomiting.  Genitourinary:  Negative for dysuria.  Neurological:  Negative for syncope and light-headedness.  All other systems reviewed and are negative.   Physical Exam Updated Vital Signs BP (!) 173/135 (BP Location: Left Arm)   Pulse (!) 119   Temp 98.4 F (36.9 C) (Oral)   Resp 18   Ht _0  (1.499 m)   Wt 86.2 kg   LMP 02/02/2022   SpO2 95%   BMI 38.38 kg/m  Physical Exam Vitals and nursing note reviewed.  Constitutional:      General: She is in acute distress.     Appearance: Normal appearance. She is obese. She is not ill-appearing.  HENT:     Head: Normocephalic and atraumatic.     Nose: Nose normal.  Eyes:     General: No scleral icterus.    Extraocular Movements: Extraocular movements intact.     Conjunctiva/sclera: Conjunctivae normal.  Cardiovascular:     Rate and Rhythm: Regular rhythm. Tachycardia present.     Pulses: Normal  pulses.  Pulmonary:     Effort: Respiratory distress present.     Breath sounds: No wheezing or rales.     Comments: Tachypnea, pursed lip breathing, conversational dyspnea noted. Abdominal:     General: There is no distension.     Palpations: Abdomen is soft.     Tenderness: There is abdominal tenderness. There is guarding. There is no right CVA tenderness or left CVA tenderness.  Musculoskeletal:        General: Normal range of motion.     Cervical back: Normal range of motion.     Right lower leg: Edema present.     Left lower leg: Edema present.  Skin:    General: Skin is warm and dry.  Neurological:  General: No focal deficit present.     Mental Status: She is alert. Mental status is at baseline.     ED Results / Procedures / Treatments   Labs (all labs ordered are listed, but only abnormal results are displayed) Labs Reviewed  BASIC METABOLIC PANEL  BRAIN NATRIURETIC PEPTIDE  CBC WITH DIFFERENTIAL/PLATELET  TROPONIN I (HIGH SENSITIVITY)    EKG EKG Interpretation  Date/Time:  Tuesday February 22 2022 17:02:15 EDT Ventricular Rate:  119 PR Interval:  134 QRS Duration: 84 QT Interval:  325 QTC Calculation: 458 R Axis:   -42 Text Interpretation: Sinus tachycardia Probable left atrial enlargement Left anterior fascicular block Abnormal R-wave progression, late transition Left ventricular hypertrophy Nonspecific T abnormalities, lateral leads rate faster and nonspecific ts compared with prior 7/23 Confirmed by Aletta Edouard [phone]) on 02/22/2022 5:16:20 PM  Radiology No results found.  Procedures .Critical Care  Performed by: Evlyn Courier, PA-C Authorized by: Evlyn Courier, PA-C   Critical care provider statement:    Critical care time (minutes):  30   Critical care was necessary to treat or prevent imminent or life-threatening deterioration of the following conditions:  Cardiac failure, circulatory failure and respiratory failure   Critical care was time spent  personally by me on the following activities:  Development of treatment plan with patient or surrogate, discussions with consultants, evaluation of patient's response to treatment, examination of patient, ordering and review of laboratory studies, ordering and review of radiographic studies, ordering and performing treatments and interventions, pulse oximetry, re-evaluation of patient's condition and review of old charts     Medications Ordered in ED Medications  furosemide (LASIX) injection 80 mg (has no administration in time range)    ED Course/ Medical Decision Making/ A&P Clinical Course as of 02/22/22 2038  Tue Feb 22, 2022  1854 Patient's initial troponin of 161.  EKG shows T wave inversions but otherwise without acute ischemic changes.  Patient reports chest pain has been ongoing for weeks.  CBC otherwise shows mild leukocytosis, and mild anemia which appears to be at patient's baseline.  BMP shows glucose of 585, mild hyponatremia which is likely pseudohyponatremia given elevated glucose, preserved renal function.  BNP is pending.  Chest x-ray does not show vascular congestion.  CT angio chest for PE study, CT abdomen pelvis pending.  Case discussed with cardiologist on-call who is in agreement with IVP Lasix, and initiation of heparin drip.  They recommend admission to medicine service and cardiology will follow. [AA]    Clinical Course User Index [AA] Evlyn Courier, PA-C                           Medical Decision Making Amount and/or Complexity of Data Reviewed Labs: ordered. Radiology: ordered.  Risk OTC drugs. Prescription drug management. Decision regarding hospitalization.   Medical Decision Making / ED Course   This patient presents to the ED for concern of dyspnea on exertion, peripheral edema, this involves an extensive number of treatment options, and is a complaint that carries with it a high risk of complications and morbidity.  The differential diagnosis includes  ACS, CHF exacerbation, pneumonia, PE  MDM: 46 year old female with past medical history of HFrEF with EF of 20 to 25% presents today for evaluation of chest pain, worsening dyspnea on exertion.  Endorses orthopnea.  Conversational dyspnea and 1+ pitting edema present on exam.  Patient has been noncompliant with her medications including anticoagulation.  States the abdominal pain, nausea  vomiting is not typical for her CHF exacerbation.  She reports generalized body ache.  Blood work, chest x-ray including BNP, troponin, CTA chest PE study, CT abdomen pelvis with contrast ordered.  EKG without acute ischemic changes.  80 mg IV push Lasix ordered.  Patient without significant work of breathing or significant distress warranting BiPAP or invasive intervention at this time. Chest x-ray does not show signs of volume overload.  BNP elevated at 443 which is above patient's baseline.  Second troponin downtrending to 155.  Elevated glucose, 10 units insulin ordered.  CTA chest without evidence of PE.  CT abdomen pelvis without acute intra-abdominal findings to explain patient's symptoms.  Likely secondary to heart failure exacerbation.  Will discuss with hospitalist. Discussed with hospitalist who will evaluate patient for admission.   Lab Tests: -I ordered, reviewed, and interpreted labs.   The pertinent results include:   Labs Reviewed  BASIC METABOLIC PANEL  BRAIN NATRIURETIC PEPTIDE  CBC WITH DIFFERENTIAL/PLATELET  TROPONIN I (HIGH SENSITIVITY)      EKG  EKG Interpretation  Date/Time:  Tuesday February 22 2022 17:02:15 EDT Ventricular Rate:  119 PR Interval:  134 QRS Duration: 84 QT Interval:  325 QTC Calculation: 458 R Axis:   -42 Text Interpretation: Sinus tachycardia Probable left atrial enlargement Left anterior fascicular block Abnormal R-wave progression, late transition Left ventricular hypertrophy Nonspecific T abnormalities, lateral leads rate faster and nonspecific ts compared with  prior 7/23 Confirmed by Aletta Edouard [phone]) on 02/22/2022 5:16:20 PM         Imaging Studies ordered: I ordered imaging studies including chest x-ray, CTA chest PE study, CT abdomen pelvis with contrast I independently visualized and interpreted imaging. I agree with the radiologist interpretation   Medicines ordered and prescription drug management: Meds ordered this encounter  Medications   furosemide (LASIX) injection 80 mg   HYDROcodone-acetaminophen (NORCO/VICODIN) 5-325 MG per tablet 1 tablet    -I have reviewed the patients home medicines and have made adjustments as needed  Critical interventions IV push Lasix, heparin drip  Consultations Obtained: I requested consultation with the cardiology,  and discussed lab and imaging findings as well as pertinent plan - they recommend: Admission to medicine and they will continue to follow.  They agree with IV push Lasix, and heparin drip   Cardiac Monitoring: The patient was maintained on a cardiac monitor.  I personally viewed and interpreted the cardiac monitored which showed an underlying rhythm of: Sinus tachycardia  Reevaluation: After the interventions noted above, I reevaluated the patient and found that they have :stayed the same  Co morbidities that complicate the patient evaluation  Past Medical History:  Diagnosis Date   Abnormal Pap smear    Acute on chronic combined systolic and diastolic CHF (congestive heart failure) ([place]) 02/19/2019   Acute on chronic congestive heart failure ([facility])    Acute on chronic congestive heart failure ([place])    Acute on chronic diastolic CHF (congestive heart failure) ([place]) 10/19/2021   Anemia    Asthma    Atrial fibrillation ([place])    history of paroxysmal atrial fibrillation   CHF exacerbation ([place]) 10/17/2021   Diabetes mellitus    DKA, type 2 ([place]) 02/06/2019   Elevated troponin 09/30/2019   Heart failure with reduced ejection fraction ([place])    History of Left ventricular  thrombus 09/22/2021   Hydrosalpinx 08/21/2009   Dx on Pelvic US in Jan 2011.  Consider chronic etiology given continued fertility issues   Hypertension  Hypertensive emergency 09/30/2019   Major depressive disorder 02/08/2012   Reports that 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     Migraines    Morbid obesity ([place]) 02/06/2019   Nonischemic cardiomyopathy ([place]) 09/22/2021   Obstructive sleep apnea 06/30/2015   Polycystic ovarian disease       Dispostion: Discussed with hospitalist will evaluate patient for admission     Final Clinical Impression(s) / ED Diagnoses Final diagnoses:  NSTEMI (non-ST elevated myocardial infarction) ([place])  Acute on chronic HFrEF (heart failure with reduced ejection fraction) [facility])    Rx / DC Orders ED Discharge Orders     None         Evlyn Courier, PA-C 02/22/22 2124    Hayden Rasmussen, MD 02/23/22 1023

## 2022-02-23 ENCOUNTER — Inpatient Hospital Stay (HOSPITAL_COMMUNITY): Payer: Medicaid Other

## 2022-02-23 DIAGNOSIS — F339 Major depressive disorder, recurrent, unspecified: Secondary | ICD-10-CM | POA: Diagnosis not present

## 2022-02-23 DIAGNOSIS — I214 Non-ST elevation (NSTEMI) myocardial infarction: Secondary | ICD-10-CM

## 2022-02-23 DIAGNOSIS — I5023 Acute on chronic systolic (congestive) heart failure: Secondary | ICD-10-CM | POA: Diagnosis not present

## 2022-02-23 DIAGNOSIS — I480 Paroxysmal atrial fibrillation: Secondary | ICD-10-CM | POA: Diagnosis not present

## 2022-02-23 DIAGNOSIS — E1165 Type 2 diabetes mellitus with hyperglycemia: Secondary | ICD-10-CM | POA: Diagnosis not present

## 2022-02-23 DIAGNOSIS — D638 Anemia in other chronic diseases classified elsewhere: Secondary | ICD-10-CM | POA: Diagnosis not present

## 2022-02-23 HISTORY — DX: Non-ST elevation (NSTEMI) myocardial infarction: I21.4

## 2022-02-23 LAB — CBG MONITORING, ED
Glucose-Capillary: 181 mg/dL — ABNORMAL HIGH (ref 70–99)
Glucose-Capillary: 188 mg/dL — ABNORMAL HIGH (ref 70–99)
Glucose-Capillary: 264 mg/dL — ABNORMAL HIGH (ref 70–99)
Glucose-Capillary: 359 mg/dL — ABNORMAL HIGH (ref 70–99)

## 2022-02-23 LAB — CBC WITH DIFFERENTIAL/PLATELET
Abs Immature Granulocytes: 0.04 10*3/uL (ref 0.00–0.07)
Basophils Absolute: 0.1 10*3/uL (ref 0.0–0.1)
Basophils Relative: 1 %
Eosinophils Absolute: 0.1 10*3/uL (ref 0.0–0.5)
Eosinophils Relative: 1 %
HCT: 36.5 % (ref 36.0–46.0)
Hemoglobin: 11.8 g/dL — ABNORMAL LOW (ref 12.0–15.0)
Immature Granulocytes: 0 %
Lymphocytes Relative: 37 %
Lymphs Abs: 3.5 10*3/uL (ref 0.7–4.0)
MCH: 24.8 pg — ABNORMAL LOW (ref 26.0–34.0)
MCHC: 32.3 g/dL (ref 30.0–36.0)
MCV: 76.7 fL — ABNORMAL LOW (ref 80.0–100.0)
Monocytes Absolute: 0.8 10*3/uL (ref 0.1–1.0)
Monocytes Relative: 9 %
Neutro Abs: 5.1 10*3/uL (ref 1.7–7.7)
Neutrophils Relative %: 52 %
Platelets: 317 10*3/uL (ref 150–400)
RBC: 4.76 MIL/uL (ref 3.87–5.11)
RDW: 14.6 % (ref 11.5–15.5)
WBC: 9.6 10*3/uL (ref 4.0–10.5)
nRBC: 0.2 % (ref 0.0–0.2)

## 2022-02-23 LAB — COMPREHENSIVE METABOLIC PANEL
ALT: 26 U/L (ref 0–44)
AST: 26 U/L (ref 15–41)
Albumin: 3.1 g/dL — ABNORMAL LOW (ref 3.5–5.0)
Alkaline Phosphatase: 116 U/L (ref 38–126)
Anion gap: 9 (ref 5–15)
BUN: 13 mg/dL (ref 6–20)
CO2: 25 mmol/L (ref 22–32)
Calcium: 8.9 mg/dL (ref 8.9–10.3)
Chloride: 103 mmol/L (ref 98–111)
Creatinine, Ser: 0.74 mg/dL (ref 0.44–1.00)
GFR, Estimated: >60 mL/min (ref >60)
Glucose, Bld: 185 mg/dL — ABNORMAL HIGH (ref 70–99)
Potassium: 3.7 mmol/L (ref 3.5–5.1)
Sodium: 137 mmol/L (ref 135–145)
Total Bilirubin: 1.7 mg/dL — ABNORMAL HIGH (ref 0.3–1.2)
Total Protein: 7.3 g/dL (ref 6.5–8.1)

## 2022-02-23 LAB — ECHOCARDIOGRAM COMPLETE
Area-P 1/2: 6.9 cm2
Calc EF: 10.1 %
Height: 59 in
Single Plane A2C EF: 11.6 %
Single Plane A4C EF: 10.1 %
Weight: 3040 oz

## 2022-02-23 LAB — PROTIME-INR
INR: 1.2 (ref 0.8–1.2)
Prothrombin Time: 14.9 seconds (ref 11.4–15.2)

## 2022-02-23 LAB — GLUCOSE, CAPILLARY
Glucose-Capillary: 107 mg/dL — ABNORMAL HIGH (ref 70–99)
Glucose-Capillary: 126 mg/dL — ABNORMAL HIGH (ref 70–99)

## 2022-02-23 LAB — LIPASE, BLOOD: Lipase: 313 U/L — ABNORMAL HIGH (ref 11–51)

## 2022-02-23 LAB — PHOSPHORUS: Phosphorus: 4.7 mg/dL — ABNORMAL HIGH (ref 2.5–4.6)

## 2022-02-23 LAB — HEPARIN LEVEL (UNFRACTIONATED)
Heparin Unfractionated: 0.15 IU/mL — ABNORMAL LOW (ref 0.30–0.70)
Heparin Unfractionated: 0.33 IU/mL (ref 0.30–0.70)

## 2022-02-23 LAB — TROPONIN I (HIGH SENSITIVITY): Troponin I (High Sensitivity): 152 ng/L (ref <18)

## 2022-02-23 LAB — MAGNESIUM: Magnesium: 2.3 mg/dL (ref 1.7–2.4)

## 2022-02-23 MED ORDER — PERFLUTREN LIPID MICROSPHERE
1.0000 mL | INTRAVENOUS | Status: AC | PRN
  Administered 2022-02-23: 3 mL via INTRAVENOUS

## 2022-02-23 MED ORDER — RIVAROXABAN 15 MG PO TABS
15.0000 mg | ORAL_TABLET | Freq: Two times a day (BID) | ORAL | Status: DC
  Administered 2022-02-23 – 2022-02-28 (×10): 15 mg via ORAL
  Filled 2022-02-23 (×10): qty 1

## 2022-02-23 MED ORDER — FUROSEMIDE 10 MG/ML IJ SOLN
40.0000 mg | Freq: Two times a day (BID) | INTRAMUSCULAR | Status: DC
  Administered 2022-02-23 – 2022-02-25 (×5): 40 mg via INTRAVENOUS
  Filled 2022-02-23 (×5): qty 4

## 2022-02-23 MED ORDER — RIVAROXABAN 20 MG PO TABS: 20.0000 mg | ORAL_TABLET | Freq: Every day | ORAL | Status: DC

## 2022-02-23 MED ORDER — HEPARIN BOLUS VIA INFUSION
2000.0000 [IU] | Freq: Once | INTRAVENOUS | Status: AC
  Administered 2022-02-23: 2000 [IU] via INTRAVENOUS
  Filled 2022-02-23: qty 2000

## 2022-02-23 NOTE — Progress Notes (Signed)
Inpatient Diabetes Program Recommendations  AACE/ADA: New Consensus Statement on Inpatient Glycemic Control (2015)  Target Ranges:  Prepandial:   less than 140 mg/dL      Peak postprandial:   less than 180 mg/dL (1-2 hours)      Critically ill patients:  140 - 180 mg/dL   Lab Results  Component Value Date   GLUCAP 264 (H) 02/23/2022   HGBA1C 12.1 (H) 01/29/2022    Review of Glycemic Control  Latest Reference Range & Units 02/23/22 00:17 02/23/22 05:19 02/23/22 07:32 02/23/22 11:40  Glucose-Capillary 70 - 99 mg/dL 359 (H) 181 (H) 188 (H) 264 (H)   Diabetes history: DM 2 Outpatient Diabetes medications:  Jardiance 10 mg daily Novolog 12 units q supper Lantus 36 units q AM Current orders for Inpatient glycemic control:  Novolog moderate tid with meals and HS Jardiance 10 mg daily Semglee 30 units with breakfast  Inpatient Diabetes Program Recommendations:    Consider adding Novolog meal coverage 4 units tid with meals.  Will follow-up with patient on 8/3 regarding A1C.   Thanks,  Adah Perl, RN, BC-ADM Inpatient Diabetes Coordinator Pager [phone]  (8a-5p)

## 2022-02-23 NOTE — Progress Notes (Addendum)
[facility]  PROGRESS NOTE  [name] BPZ:[id number] DOB: Jun 25, 1976 DOA: 02/22/2022 PCP: Darci Current, DO   Brief HPI:   46 year old female with medical history of chronic systolic/diastolic heart failure with LVEF 20 to 25%, felt to be nonischemic cardiomyopathy, paroxysmal atrial fibrillation on Xarelto, diabetes mellitus type 2, anemia of chronic disease, baseline hemoglobin 11-13, essential hypertension who was admitted to [facility] long hospital on 02/22/2022 with acute on chronic systolic/diastolic heart failure, after she presented with shortness of breath. Patient was given IV Lasix, troponin elevation 161, 155 Cardiology consulted for combined systolic and diastolic CHF exacerbation as well as suspected type II NSTEMI    Subjective   Patient complains of fatigue this morning.   Assessment/Plan:    Acute on chronic systolic/diastolic heart failure -Most recent echocardiogram in April 2023 showed EF of 20 to 25% with grade 3 diastolic dysfunction -Felt to be nonischemic cardiomyopathy per cardiology -Cardiology was consulted -Patient started on Lasix 80 mg IV twice daily; changed to 40 mg IV twice daily per cardiology -Continue Coreg 3.125 mg twice daily -Continue Jardiance  LV thrombus -Echocardiogram obtained today shows LV thrombus at apex 2.2 x 1.4 cm with some mobility -Patient was initially started on IV heparin, will switch to Xarelto as per cardiology recommendation  Paroxysmal atrial fibrillation -Patient in sinus rhythm -Continue Coreg, digoxin 0.125 mg daily -IV heparin has been switched to Xarelto as above  Uncontrolled diabetes mellitus type 2 -Hemoglobin A1c elevated at 12.1 -Started on sliding scale insulin, moderate -CBG well controlled   ?  NSTEMI -Patient has elevated troponin 161, 155 -EKG showed sinus tachycardia without acute ischemic changes -CT chest showed no evidence of PE -Initially started on heparin -Cardiology consulted, did  not feel it was a NSTEMI -Troponin elevation likely in the setting of CHF exacerbation -IV heparin has been discontinued and patient started on Xarelto as above\\  Abdominal pain -Likely in the setting of congestive hepatopathy -CT abdomen shows massive hepatic congestion in setting of acute on chronic systolic/diastolic heart failure -Continue IV diuresis  Anemia of chronic disease -Hemoglobin is stable at 11.8  Depression -Continue Wellbutrin  Medications     carvedilol  3.125 mg Oral BID WC   digoxin  0.125 mg Oral Daily   empagliflozin  10 mg Oral Daily   furosemide  40 mg Intravenous BID   insulin aspart  0-15 Units Subcutaneous TID WC   insulin aspart  0-5 Units Subcutaneous QHS   insulin glargine-yfgn  30 Units Subcutaneous Q breakfast   venlafaxine XR  37.5 mg Oral Q breakfast     Data Reviewed:   CBG:  Recent Labs  Lab 02/23/22 0017 02/23/22 0519 02/23/22 0732 02/23/22 1140 02/23/22 1701  GLUCAP 359* 181* 188* 264* 107*    SpO2: 97 %    Vitals:   02/23/22 1310 02/23/22 1315 02/23/22 1347 02/23/22 1702  BP:   (!) 157/116 (!) 141/110  Pulse:  91 85 85  Resp:  '16 15 16  '$ Temp: (!) 97.2 F (36.2 C)  98 F (36.7 C)   TempSrc: Oral  Oral   SpO2:  100% 100% 97%  Weight:      Height:          Data Reviewed:  Basic Metabolic Panel: Recent Labs  Lab 02/22/22 1706 02/22/22 1921 02/23/22 0601  NA 128*  --  137  K 4.2  --  3.7  CL 96*  --  103  CO2 17*  --  25  GLUCOSE 585*  --  185*  BUN 12  --  13  CREATININE 1.02*  --  0.74  CALCIUM 8.8*  --  8.9  MG  --  1.7 2.3  PHOS  --   --  4.7*    CBC: Recent Labs  Lab 02/22/22 1739 02/23/22 0601  WBC 10.9* 9.6  NEUTROABS 7.7 5.1  HGB 11.4* 11.8*  HCT 35.6* 36.5  MCV 76.4* 76.7*  PLT 313 317    LFT Recent Labs  Lab 02/22/22 1921 02/23/22 0601  AST 33 26  ALT 29 26  ALKPHOS 122 116  BILITOT 2.4* 1.7*  PROT 7.8 7.3  ALBUMIN 3.2* 3.1*     Antibiotics: Anti-infectives (From  admission, onward)    None        DVT prophylaxis: Xarelto  Code Status: Full code  Family Communication: No family at bedside   CONSULTS cardiology   Objective    Physical Examination:   General-appears in no acute distress Heart-S1-S2, regular, no murmur auscultated Lungs-decreased breath sounds at lung bases Abdomen-soft, nontender, no organomegaly Extremities-no edema in the lower extremities Neuro-alert, oriented x3, no focal deficit noted  Status is: Inpatient: CHF exacerbation           [place]   [facility] If 7PM-7AM, please contact night-coverage at [email or web address], Office  [phone]   02/23/2022, 5:23 PM  LOS: 1 day

## 2022-02-23 NOTE — Consult Note (Signed)
Cardiology Consultation:   Patient ID: [name] MRN: [id number]; DOB: 27-Apr-1976  Admit date: 02/22/2022 Date of Consult: 02/23/2022  PCP:  Darci Current, [place] [facility] Cardiologist:  Loralie Champagne, MD  Electrophysiologist:  Constance Haw, MD     Patient Profile:   [name] is a 46 y.o. 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%) felt to be nonischemic cardiomyopathy, paroxysmal atrial fibrillation on Xarelto, type 2 DM, anemia, HTN who is being seen 02/23/2022 for the evaluation of CHF, NSTEMI at the request of Dr. Darrick Meigs.  History of Present Illness:   [name] is a 46 year old female with above medical history who is followed by Dr. Aundra Dubin. Per chart review, patient was initially diagnosed with nonischemic cardiomyopathy in 12/2018. Echocardiogram at that time showed EF 45-36%, diastolic function could not be evaluated as patient was in atrial fibrillation at the time of the study. She was seen by EP in 12/2018 as there were concerns that her cardiomyopathy was tachy induced. Patient was started on GDMT and her afib regiment was optimized. Follow up echocardiogram 3 months later in 03/2019 showed EF 46-80%,  diastolic parameters consistent with restrictive filling, LV diffuse hypokinesis, apical thrombus, moderate RV systolic dysfunction, moderate TR, mild MR. As her EF had continued to be low, she underwent R/L heart catheterization on 03/29/2019 to rule out ischemia. Cath showed no obstructive CAD, R>L heart failure with low PAPI suggesting significant RV dysfunction, primarily pulmonary venous HTN, low cardiac output, CI 2.07. Patient was started on coumadin for LV thrombus. She has been followed by Dr. Aundra Dubin and the advanced heart failure clinic since.   After her cath, a follow up echo in 09/2019 showed EF <20%, moderate LVH, grade II diastolic dysfunction, severe RV dysfunction, moderately elevated pulmonary artery systolic pressure.  Echo from 12/2019 did show some improvement in EF to 35-40%, grade I DD.   Patient was admitted to the hospital in 08/2021 for evaluation of CHF and chest pain. Echo from 09/18/21 showed EF 20%, grade III diastolic dysfunction, elevated LVEDP, severely reduced RV systolic function, moderately elevated PA systolic pressure, mild MR. Underwent R/L heart catheterization on 09/20/21 that showed mild, nonobstructive CAD, nonischemic cardiomyopathy, elevated right and left heart filling pressures, preserved cardiac output, pulmonary venous htn. At that time, Dr. Aundra Dubin believed that her cardiomyopathy could be caused by poorly controlled DM or HTN. Patient underwent Cardiac MRI to rule out myocarditis on 09/20/21. Study showed mildly dilated LV with EF 23%, diffuse hypokinesis with inferior akinesis, moderate RV systolic dysfunction with EF 32%, no definitive evidence of prior MI, infiltrative disease, myocarditis. LV thrombus was not seen on cMRI, continued on coumadin for prevention. Later, patient was transitioned to Xarelto due to poor compliance with coumadin and subtherapeutic INRs.   Patient was again admitted to the hospital in 10/2021 for evaluation of decompensated heart failure. Echocardiogram from 11/02/21 showed EF 20-25%, grade III diastolic dysfunction, normal RV systolic function, normal pulmonary artery systolic pressure. Patient had reported noncompliance with diuretics at home, but was compliant with her other medications.  Patient was last seen by cardiology on 02/02/22 for an outpatient appointment. At that time, patient admitted to medication noncompliance. She had visited the ER for diffuse body pain and CHF, but she left before being seen. Encouraged medication compliance and diuretic use.   Patient presented to the ED on 8/1 complaining of SOB, N/V, and chest pain that began over 2 weeks ago.  Labs in the ED showed Na 128, K 4.2, creatinin 1.02, glucose 585, mag 1.7, WBC 10.9, hemoglobin 11.4,  platelets 313. hsTn 161>>155>>152. BNP elevated to 443.3  CXR showed cardiomegaly without additional acute cardiopulmonary process. EKG showed sinus tachycardia, HR 119 BPM, LAFB, abnormal R wave progression (late transition), LVH.  CT chest was negative for acute pulmonary embolus, ovoid hypodensity/filling defect within the left ventricular apex (needs echo to exclude LV thrombus), cardiomegaly with reflux of contrast into the hepatic veins consistent with elevated right heart pressures, heterogenous enhancement of the liver likely due to passive congestion.   On interview, patient reports that for the past several days, she has been very weak and nauseous. Has not been able to keep down food, drinks, or her medications. Reportedly has tried to take her medications, but has been throwing them up. She has also had some sob on exertion, orthopnea, and feels like she is holding onto fluid. Developed chest pain that is sharp, worse when laying on her back, and worse with deep inhalation. Denies fever and chills. Due to her nausea, weakness, sob, she has been unable to complete her ADLs and has been depending on her boyfriend to help her. Patient has a poor understanding of her past medical history and does not recall when she was diagnosed with heart failure, what her past echos have shown, her history of LV thrombus, or procedures she has had in the past.   Past Medical History:  Diagnosis Date   Abnormal Pap smear    Acute on chronic combined systolic and diastolic CHF (congestive heart failure) ([place]) 02/19/2019   Acute on chronic congestive heart failure (HCC)    Acute on chronic congestive heart failure (HCC)    Acute on chronic diastolic CHF (congestive heart failure) ([place]) 10/19/2021   Anemia    Asthma    Atrial fibrillation ([place])    history of paroxysmal atrial fibrillation   CHF exacerbation ([place]) 10/17/2021   Diabetes mellitus    DKA, type 2 ([place]) 02/06/2019   Elevated troponin 09/30/2019    Heart failure with reduced ejection fraction ([place])    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  Consider chronic etiology given continued fertility issues   Hypertension    Hypertensive emergency 09/30/2019   Major depressive disorder 02/08/2012   Reports that 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     Migraines    Morbid obesity ([place]) 02/06/2019   Nonischemic cardiomyopathy ([place]) 09/22/2021   Obstructive sleep apnea 06/30/2015   Polycystic ovarian disease     Past Surgical History:  Procedure Laterality Date   BIOPSY  11/05/2021   Procedure: BIOPSY;  Surgeon: Daryel November, MD;  Location: [facility] ENDOSCOPY;  Service: Gastroenterology;;   CERVIX LESION DESTRUCTION     CESAREAN SECTION     ESOPHAGOGASTRODUODENOSCOPY (EGD) WITH PROPOFOL N/A 11/05/2021   Procedure: ESOPHAGOGASTRODUODENOSCOPY (EGD) WITH PROPOFOL;  Surgeon: Daryel November, MD;  Location: [facility];  Service: Gastroenterology;  Laterality: N/A;   RIGHT/LEFT HEART CATH AND CORONARY ANGIOGRAPHY N/A 03/29/2019   Procedure: RIGHT/LEFT HEART CATH AND CORONARY ANGIOGRAPHY;  Surgeon: Larey Dresser, MD;  Location: [place] CV LAB;  Service: Cardiovascular;  Laterality: N/A;   RIGHT/LEFT HEART CATH AND CORONARY ANGIOGRAPHY N/A 09/20/2021   Procedure: RIGHT/LEFT HEART CATH AND CORONARY ANGIOGRAPHY;  Surgeon: Larey Dresser, MD;  Location: [place]  CV LAB;  Service: Cardiovascular;  Laterality: N/A;     Home Medications:  Prior to Admission medications   Medication Sig Start Date End Date Taking? Authorizing Provider  albuterol (VENTOLIN HFA) 108 (90 Base) MCG/ACT inhaler Inhale 2 puffs into the lungs every 6 (six) hours as needed for wheezing or shortness of breath. 09/16/21 09/16/22 Yes Lilland, Alana, DO  carvedilol (COREG) 6.25 MG tablet Take 1 tablet (6.25 mg total) by mouth 2 (two) times daily with  a meal. 09/22/21  Yes Gladys Damme, MD  digoxin (LANOXIN) 0.125 MG tablet Take 1 tablet (0.125 mg total) by mouth daily. 11/08/21  Yes Sharion Settler, DO  empagliflozin (JARDIANCE) 10 MG TABS tablet Take 1 tablet (10 mg total) by mouth daily. 11/08/21  Yes Espinoza, Dawson Bills, DO  insulin aspart (NOVOLOG FLEXPEN) 100 UNIT/ML FlexPen Inject 12 Units into the skin daily before supper. 09/02/21  Yes Hensel, Jamal Collin, MD  insulin glargine (LANTUS) 100 UNIT/ML Solostar Pen Inject 36 Units into the skin every morning. 09/02/21  Yes Hensel, Jamal Collin, MD  naproxen (NAPROSYN) 500 MG tablet Take up to twice a day as needed for severe pain.  Not take every day. 02/10/22  Yes Chambliss, Jeb Levering, MD  ondansetron (ZOFRAN) 4 MG tablet Take 1 tablet (4 mg total) by mouth every 8 (eight) hours as needed for nausea or vomiting. 02/10/22  Yes Darci Current, DO  rivaroxaban (XARELTO) 20 MG TABS tablet Take 1 tablet (20 mg total) by mouth daily with supper. 10/19/21  Yes Wells Guiles, DO  spironolactone (ALDACTONE) 25 MG tablet Take 0.5 tablets (12.5 mg total) by mouth daily. 11/08/21  Yes Sharion Settler, DO  torsemide (DEMADEX) 20 MG tablet Take 2 tablets (40 mg total) by mouth 2 (two) times daily. 11/08/21  Yes Sharion Settler, DO  venlafaxine XR (EFFEXOR XR) 37.5 MG 24 hr capsule Take 1 capsule (37.5 mg total) by mouth daily with breakfast. 02/10/22  Yes Chambliss, Jeb Levering, MD  Accu-Chek Softclix Lancets lancets Use as instructed 02/12/19   Caroline More, DO  atorvastatin (LIPITOR) 40 MG tablet TAKE 1 TABLET BY MOUTH DAILY AT 6 PM. Patient not taking: Reported on 02/22/2022 04/01/20 08/27/22  Larey Dresser, MD  blood glucose meter kit and supplies KIT Dispense based on patient and insurance preference. Use up to four times daily as directed. (FOR ICD-9 250.00, 250.01). 09/27/21   Autry-Lott, Naaman Plummer, DO  Blood Glucose Monitoring Suppl (ACCU-CHEK AVIVA PLUS) w/Device KIT 1 application by Does not apply route  QID. Check fasting sugars as well as throughout the day 02/12/19   Caroline More, DO  glucose blood (ACCU-CHEK AVIVA PLUS) test strip Check CBG 2 times per day 08/16/19   [name], DO  glucose blood (AGAMATRIX PRESTO TEST) test strip Use as instructed 10/23/17   Zenia Resides, MD  hydrOXYzine (ATARAX) 10 MG tablet Take 1 tablet (10 mg total) by mouth 3 (three) times daily as needed. Patient taking differently: Take 10 mg by mouth 3 (three) times daily as needed for anxiety. 10/22/21   Gladys Damme, MD  Insulin Pen Needle (UNIFINE PENTIPS) 31G X 5 MM MISC USE AS DIRECTED 2 TIMES DAILY 09/02/21   Zenia Resides, MD  Insulin Syringe-Needle U-100 (INSULIN SYRINGE .3CC/31GX5/16") 31G X 5/16" 0.3 ML MISC 15 Units by Does not apply route 2 (two) times daily. 09/05/20   Rai, Vernelle Emerald, MD  Lancets Misc. (ACCU-CHEK SOFTCLIX LANCET DEV) KIT 1 application by Does not apply route 2 (two) times daily.  08/16/19   [name], DO  metoCLOPramide (REGLAN) 5 MG tablet Take 1 tablet (5 mg total) by mouth every 8 (eight) hours as needed for nausea. Patient not taking: Reported on 02/22/2022 11/05/21 11/05/22  Erskine Emery, MD    Inpatient Medications: Scheduled Meds:  carvedilol  3.125 mg Oral BID WC   digoxin  0.125 mg Oral Daily   empagliflozin  10 mg Oral Daily   furosemide  80 mg Intravenous BID   insulin aspart  0-15 Units Subcutaneous TID WC   insulin aspart  0-5 Units Subcutaneous QHS   insulin glargine-yfgn  30 Units Subcutaneous Q breakfast   venlafaxine XR  37.5 mg Oral Q breakfast   Continuous Infusions:  heparin 900 Units/hr (02/23/22 0429)   PRN Meds: acetaminophen **OR** acetaminophen, morphine injection, naLOXone (NARCAN)  injection, ondansetron (ZOFRAN) IV  Allergies:    Allergies  Allergen Reactions   Shellfish Allergy Swelling    Airway involvement including EMS - Has Epi-Pen   Metformin And Related Diarrhea   Trulicity [Dulaglutide] Nausea And Vomiting and Other (See  Comments)    Multiple episodes of vomiting over multiple days   Ibuprofen Other (See Comments)    Per PCP interferes with daily meds (heart failure)    Social History:   Social History   Socioeconomic History   Marital status: Single    Spouse name: Not on file   Number of children: Not on file   Years of education: Not on file   Highest education level: Not on file  Occupational History   Not on file  Tobacco Use   Smoking status: Never   Smokeless tobacco: Never   Tobacco comments:    Never smoke 10/22/21  Vaping Use   Vaping Use: Never used  Substance and Sexual Activity   Alcohol use: No    Alcohol/week: 0.0 standard drinks of alcohol   Drug use: No   Sexual activity: Yes  Other Topics Concern   Not on file  Social History Narrative   Not on file   Social Determinants of Health   Financial Resource Strain: High Risk (10/17/2019)   Overall Financial Resource Strain (CARDIA)    Difficulty of Paying Living Expenses: Hard  Food Insecurity: No Food Insecurity (08/19/2020)   Hunger Vital Sign    Worried About Running Out of Food in the Last Year: Never true    Ran Out of Food in the Last Year: Never true  Transportation Needs: Unmet Transportation Needs (10/22/2021)   PRAPARE - Transportation    Lack of Transportation (Medical): Yes    Lack of Transportation (Non-Medical): Yes  Physical Activity: Insufficiently Active (08/19/2020)   Exercise Vital Sign    Days of Exercise per Week: 7 days    Minutes of Exercise per Session: 20 min  Stress: Stress Concern Present (08/19/2020)   [place]    Feeling of Stress : Very much  Social Connections: Not on file  Intimate Partner Violence: Not At Risk (08/19/2020)   Humiliation, Afraid, Rape, and Kick questionnaire    Fear of Current or Ex-Partner: No    Emotionally Abused: No    Physically Abused: No    Sexually Abused: No    Family History:    Family  History  Problem Relation Age of Onset   Diabetes Mother    Hypertension Mother    Hypertension Father    Diabetes Father      ROS:  Please see the  history of present illness.   All other ROS reviewed and negative.     Physical Exam/Data:   Vitals:   02/23/22 0630 02/23/22 0631 02/23/22 0700 02/23/22 0730  BP: (!) 127/93  123/89 (!) 118/103  Pulse: 96  91 87  Resp: (!) _0 Temp:  97.9 F (36.6 C)    TempSrc:  Oral    SpO2: 98%  97% 96%  Weight:      Height:        Intake/Output Summary (Last 24 hours) at 02/23/2022 0742 Last data filed at 02/23/2022 0340 Gross per 24 hour  Intake 46.08 ml  Output 1000 ml  Net -953.92 ml      02/22/2022    5:02 PM 02/10/2022    2:24 PM 02/02/2022    3:10 PM  Last 3 Weights  Weight (lbs) 190 lb 190 lb 2 oz 190 lb  Weight (kg) 86.183 kg 86.24 kg 86.183 kg     Body mass index is 38.38 kg/m.  General:  Pleasant middle aged female, resting in the bed in no acute distress  HEENT: normal Neck: no JVD Vascular: Radial pulses 2+ bilaterally Cardiac:  normal S1, S2; Regular rhythm, tachycardic. No murmurs, rubs, or gallops  Lungs:  clear to auscultation bilaterally, no wheezing, rhonchi or rales  Abd: soft, mildly tender to palpation  Ext: no edema Musculoskeletal:  No deformities, BUE and BLE strength normal and equal Skin: warm and dry  Neuro:  CNs 2-12 intact, no focal abnormalities noted Psych:  Normal affect   EKG:  The EKG was personally reviewed and demonstrates: sinus tachycardia, HR 119 BPM, LAFB, abnormal R wave progression (late transition), LVH  Telemetry:  Telemetry was personally reviewed and demonstrates:  Sinus rhythm/sinus tachycardia, HR in the 90s-110s   Relevant CV Studies:   Laboratory Data:  High Sensitivity Troponin:   Recent Labs  Lab 02/22/22 1706 02/22/22 1921 02/23/22 0601  TROPONINIHS 161* 155* 152*     Chemistry Recent Labs  Lab 02/22/22 1706 02/22/22 1921 02/23/22 0601  NA 128*  --   137  K 4.2  --  3.7  CL 96*  --  103  CO2 17*  --  25  GLUCOSE 585*  --  185*  BUN 12  --  13  CREATININE 1.02*  --  0.74  CALCIUM 8.8*  --  8.9  MG  --  1.7 2.3  GFRNONAA >60  --  >60  ANIONGAP 15  --  9    Recent Labs  Lab 02/22/22 1921 02/23/22 0601  PROT 7.8 7.3  ALBUMIN 3.2* 3.1*  AST 33 26  ALT 29 26  ALKPHOS 122 116  BILITOT 2.4* 1.7*   Lipids No results for input(s): "CHOL", "TRIG", "HDL", "LABVLDL", "LDLCALC", "CHOLHDL" in the last 168 hours.  Hematology Recent Labs  Lab 02/22/22 1739 02/23/22 0601  WBC 10.9* 9.6  RBC 4.66 4.76  HGB 11.4* 11.8*  HCT 35.6* 36.5  MCV 76.4* 76.7*  MCH 24.5* 24.8*  MCHC 32.0 32.3  RDW 14.7 14.6  PLT 313 317   Thyroid No results for input(s): "TSH", "FREET4" in the last 168 hours.  BNP Recent Labs  Lab 02/22/22 1706  BNP 443.3*    DDimer No results for input(s): "DDIMER" in the last 168 hours.   Radiology/Studies:  CT Angio Chest PE W and/or Wo Contrast  Result Date: 02/22/2022 CLINICAL DATA:  Abdomen pain chest pain nausea vomiting short of breath EXAM: CT ANGIOGRAPHY CHEST  CT ABDOMEN AND PELVIS WITH CONTRAST TECHNIQUE: Multidetector CT imaging of the chest was performed using the standard protocol during bolus administration of intravenous contrast. Multiplanar CT image reconstructions and MIPs were obtained to evaluate the vascular anatomy. Multidetector CT imaging of the abdomen and pelvis was performed using the standard protocol during bolus administration of intravenous contrast. RADIATION DOSE REDUCTION: This exam was performed according to the departmental dose-optimization program which includes automated exposure control, adjustment of the mA and/or kV according to patient size and/or use of iterative reconstruction technique. CONTRAST:  170m OMNIPAQUE IOHEXOL 350 MG/ML SOLN COMPARISON:  Chest x-ray 02/22/2022, CT abdomen pelvis 11/02/2021, CT chest 09/17/2021 FINDINGS: CTA CHEST FINDINGS Cardiovascular: Satisfactory  opacification of the pulmonary arteries to the segmental level. No evidence of pulmonary embolism. Cardiomegaly. No pericardial effusion. Nonaneurysmal aorta. On the CT abdomen pelvis images, there is an oval hypodensity near the left ventricular apex. Mediastinum/Nodes: No enlarged mediastinal, hilar, or axillary lymph nodes. Thyroid gland, trachea, and esophagus demonstrate no significant findings. Lungs/Pleura: Lungs are clear. No pleural effusion or pneumothorax. Musculoskeletal: No chest wall abnormality. No acute or significant osseous findings. Review of the MIP images confirms the above findings. CT ABDOMEN and PELVIS FINDINGS Hepatobiliary: Heterogenous liver attenuation. No focal hepatic abnormality. No calcified gallstone or biliary dilatation. Pancreas: Unremarkable. No pancreatic ductal dilatation or surrounding inflammatory changes. Spleen: Normal in size without focal abnormality. Adrenals/Urinary Tract: Adrenal glands are normal. Kidneys show no hydronephrosis. Cyst in the right kidney. Subcentimeter hypodensities in the left kidney too small to further characterize. The bladder is normal Stomach/Bowel: The stomach is nonenlarged. No dilated small bowel. Negative appendix. No acute bowel wall thickening Vascular/Lymphatic: Mild aortic atherosclerosis. No aneurysm. No suspicious lymph nodes. Reproductive: Prominent uterine fundus. No suspicious adnexal mass. Other: Negative for pelvic effusion or free air. Mild generalized subcutaneous edema. Musculoskeletal: No acute osseous abnormality. Review of the MIP images confirms the above findings. IMPRESSION: 1. Negative for acute pulmonary embolus. Ovoid hypodensity/filling defect within the left ventricular apex seen best on the abdominal images, recommend correlation with echocardiogram to exclude small volume thrombus or mass. 2. Cardiomegaly with reflux of contrast into the hepatic veins consistent with elevated right heart pressures. 3. Heterogenous  enhancement of the liver likely due to passive congestion. 4. Mild generalized subcutaneous edema. Electronically Signed   By: KDonavan FoilM.D.   On: 02/22/2022 20:34   CT ABDOMEN PELVIS W CONTRAST  Result Date: 02/22/2022 CLINICAL DATA:  Abdomen pain chest pain nausea vomiting short of breath EXAM: CT ANGIOGRAPHY CHEST CT ABDOMEN AND PELVIS WITH CONTRAST TECHNIQUE: Multidetector CT imaging of the chest was performed using the standard protocol during bolus administration of intravenous contrast. Multiplanar CT image reconstructions and MIPs were obtained to evaluate the vascular anatomy. Multidetector CT imaging of the abdomen and pelvis was performed using the standard protocol during bolus administration of intravenous contrast. RADIATION DOSE REDUCTION: This exam was performed according to the departmental dose-optimization program which includes automated exposure control, adjustment of the mA and/or kV according to patient size and/or use of iterative reconstruction technique. CONTRAST:  1047mOMNIPAQUE IOHEXOL 350 MG/ML SOLN COMPARISON:  Chest x-ray 02/22/2022, CT abdomen pelvis 11/02/2021, CT chest 09/17/2021 FINDINGS: CTA CHEST FINDINGS Cardiovascular: Satisfactory opacification of the pulmonary arteries to the segmental level. No evidence of pulmonary embolism. Cardiomegaly. No pericardial effusion. Nonaneurysmal aorta. On the CT abdomen pelvis images, there is an oval hypodensity near the left ventricular apex. Mediastinum/Nodes: No enlarged mediastinal, hilar, or axillary lymph nodes. Thyroid gland, trachea,  and esophagus demonstrate no significant findings. Lungs/Pleura: Lungs are clear. No pleural effusion or pneumothorax. Musculoskeletal: No chest wall abnormality. No acute or significant osseous findings. Review of the MIP images confirms the above findings. CT ABDOMEN and PELVIS FINDINGS Hepatobiliary: Heterogenous liver attenuation. No focal hepatic abnormality. No calcified gallstone or  biliary dilatation. Pancreas: Unremarkable. No pancreatic ductal dilatation or surrounding inflammatory changes. Spleen: Normal in size without focal abnormality. Adrenals/Urinary Tract: Adrenal glands are normal. Kidneys show no hydronephrosis. Cyst in the right kidney. Subcentimeter hypodensities in the left kidney too small to further characterize. The bladder is normal Stomach/Bowel: The stomach is nonenlarged. No dilated small bowel. Negative appendix. No acute bowel wall thickening Vascular/Lymphatic: Mild aortic atherosclerosis. No aneurysm. No suspicious lymph nodes. Reproductive: Prominent uterine fundus. No suspicious adnexal mass. Other: Negative for pelvic effusion or free air. Mild generalized subcutaneous edema. Musculoskeletal: No acute osseous abnormality. Review of the MIP images confirms the above findings. IMPRESSION: 1. Negative for acute pulmonary embolus. Ovoid hypodensity/filling defect within the left ventricular apex seen best on the abdominal images, recommend correlation with echocardiogram to exclude small volume thrombus or mass. 2. Cardiomegaly with reflux of contrast into the hepatic veins consistent with elevated right heart pressures. 3. Heterogenous enhancement of the liver likely due to passive congestion. 4. Mild generalized subcutaneous edema. Electronically Signed   By: Donavan Foil M.D.   On: 02/22/2022 20:34   DG Chest 2 View  Result Date: 02/22/2022 CLINICAL DATA:  Shortness of breath EXAM: CHEST - 2 VIEW COMPARISON:  01/28/2022 FINDINGS: Unchanged cardiomegaly. Mild bibasilar atelectasis, similar to the prior. No new focal pulmonary opacity. No pleural effusion or pneumothorax. No acute osseous abnormality. IMPRESSION: Cardiomegaly without additional acute cardiopulmonary process. Electronically Signed   By: Merilyn Baba M.D.   On: 02/22/2022 18:06     Assessment and Plan:   Acute on Chronic Combined Systolic and Diastolic Heart Failure Nonischemic cardiomyopathy   - Most Recent echocardiogram from 11/02/21 showed EF 20-25%, grade III diastolic dysfunction, normal RV systolic function - R/L heart catheterization from 09/20/21 showed mild, nonobstructive CAD - cMRI from 09/20/21 showed no myocardial LGE, so no definitive evidence for prior MI, infiltrative disease, myocarditis  - PTA-- patient was on a regiment of carvedilol 6.25 mg BID, digoxin 0.125 mg daily, jardiance 10 mg daily, spironolactone 12.5 mg daily, torsemide 40 mg BID  - Now, presenting complaining of N/V, generalized malaise, dyspnea, swelling.  - BNP elevated to 443.3 - CT chest/abd with cardiomegaly with reflux of contrast into the hepatic veins, consistent with elevated right heart pressures, heterogenous enhancement of the liver likely due to passive congestion - Given 2 dosed of IV lasix 80 mg so far-- output 1 L urine yesterday. Creatinine improved with diuresis from 1.02>>0.74 - Decrease lasix dose to IV 40 mg BID for now as patient has developed AKI with IV diuresis during past admissions. Volume status is somewhat difficult to assess on exam due to body habitus, will depend on daily weights, I/Os, and renal function to guide diuresis  - Continue carvedilol at 3.25 mg (dose reduced as patient is decompensated and has questionable compliance with medications)  - Continue home spiro and jardiance  - Historically has been on entresto but it was stopped due to low BP. 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 She was started on coumadin (although she was noncompliant and had multiple subtherapeutic INRs) - Thrombus was not present on cMRI-- later transitioned to Xarelto due to noncomplinace with coumadin  - Now, CT chest/abdomen showed an oval hypodensity near  the LV apex, recommended correlation with echo to exclude thrombus or mass   - Echo this admission pending - Continue IV heparin for now, suspect she will need to be bridged to coumadin if echo confirms thrombus    Elevated Troponin  - hsTn 161>>155>>152. This flat trend is not consistent with ACS.  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 The patient's score is based upon: CHF History: 1 HTN History: 1 Diabetes History: 1 Stroke History: 0 Vascular Disease History: 1 Age Score: 0 Gender Score: 1        For questions or updates, please contact [facility] Please consult [email or web address] for contact info under    Signed, Margie Billet, PA-C  02/23/2022 7:42 AM

## 2022-02-23 NOTE — Progress Notes (Signed)
[facility] [place] for heparin Indication: chest pain/ACS  Allergies  Allergen Reactions   Shellfish Allergy Swelling    Airway involvement including EMS - Has Epi-Pen   Metformin And Related Diarrhea   Trulicity [Dulaglutide] Nausea And Vomiting and Other (See Comments)    Multiple episodes of vomiting over multiple days   Ibuprofen Other (See Comments)    Per PCP interferes with daily meds (heart failure)    Patient Measurements: Height: '4\' 11"'$  (149.9 cm) Weight: 86.2 kg (190 lb) IBW/kg (Calculated) : 43.2 Heparin Dosing Weight: 64 kg  Vital Signs: Temp: 97.5 F (36.4 C) (08/02 0200) Temp Source: Oral (08/01 2200) BP: 146/92 (08/02 0330) Pulse Rate: 97 (08/02 0330)  Labs: Recent Labs    02/22/22 1706 02/22/22 1739 02/22/22 1921 02/23/22 0230  HGB  --  11.4*  --   --   HCT  --  35.6*  --   --   PLT  --  313  --   --   APTT  --   --  22*  --   HEPARINUNFRC  --   --  <0.10* 0.15*  CREATININE 1.02*  --   --   --   TROPONINIHS 161*  --  155*  --      Estimated Creatinine Clearance: 67.1 mL/min (A) (by C-G formula based on SCr of 1.02 mg/dL (H)).   Medical History: Past Medical History:  Diagnosis Date   Abnormal Pap smear    Acute on chronic combined systolic and diastolic CHF (congestive heart failure) ([place]) 02/19/2019   Acute on chronic congestive heart failure ([facility])    Acute on chronic congestive heart failure (H[facility])    Acute on chronic diastolic CHF (congestive heart failure) ([place]) 10/19/2021   Anemia    Asthma    Atrial fibrillation ([place])    history of paroxysmal atrial fibrillation   CHF exacerbation ([place]) 10/17/2021   Diabetes mellitus    DKA, type 2 ([place]) 02/06/2019   Elevated troponin 09/30/2019   Heart failure with reduced ejection fraction ([place])    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  Consider chronic etiology given continued fertility issues    Hypertension    Hypertensive emergency 09/30/2019   Major depressive disorder 02/08/2012   Reports that 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     Migraines    Morbid obesity ([place]) 02/06/2019   Nonischemic cardiomyopathy ([place]) 09/22/2021   Obstructive sleep apnea 06/30/2015   Polycystic ovarian disease     Medications: Pt prescribed rivaroxaban 20 mg daily PTA  -Pt reports that she has not taken in the past 5 days  Assessment: Pt is a 69 yoF presenting with SOB, N/V, chest pain. PMH significant for atrial fibrillation for which she is prescribed Xarelto. Pharmacy consulted to dose heparin for ACS. Baseline labs: CBC: Hgb slightly low; Plt WNL, Troponin: 161, SCr WNL,  HL undetectable (consistent with pt missing several days of Xarelto). Able to monitor using HL only.   02/23/2022: Initial heparin level 0.15- subtherapeutic on IV heparin 750 units/hr No bleeding or infusion related issues reported by RN  Goal of Therapy:  Heparin level 0.3-0.7 Monitor platelets by anticoagulation protocol: Yes   Plan:  Heparin rebolus of 2000 units IV once Increase heparin infusion at 900 units/hr Check 6 hour HL. Daily CBC, HL while on heparin.  Monitor for signs of bleeding.   Netta Cedars, PharmD 02/23/2022,4:04 AM

## 2022-02-23 NOTE — Progress Notes (Signed)
ANTICOAGULATION CONSULT NOTE - Follow Up Consult  Pharmacy Consult for heparin Indication: hx atrial fibrillation (home xarelto on hold); now with possible LV thrombus  Allergies  Allergen Reactions   Shellfish Allergy Swelling    Airway involvement including EMS - Has Epi-Pen   Metformin And Related Diarrhea   Trulicity [Dulaglutide] Nausea And Vomiting and Other (See Comments)    Multiple episodes of vomiting over multiple days   Ibuprofen Other (See Comments)    Per PCP interferes with daily meds (heart failure)    Patient Measurements: Height: '4\' 11"'$  (149.9 cm) Weight: 86.2 kg (190 lb) IBW/kg (Calculated) : 43.2 Heparin Dosing Weight: 64 kg  Vital Signs: Temp: 97.7 F (36.5 C) (08/02 1016) Temp Source: Oral (08/02 1016) BP: 145/113 (08/02 0930) Pulse Rate: 91 (08/02 0930)  Labs: Recent Labs    02/22/22 1706 02/22/22 1739 02/22/22 1921 02/23/22 0230 02/23/22 0601  HGB  --  11.4*  --   --  11.8*  HCT  --  35.6*  --   --  36.5  PLT  --  313  --   --  317  APTT  --   --  22*  --   --   LABPROT  --   --   --   --  14.9  INR  --   --   --   --  1.2  HEPARINUNFRC  --   --  <0.10* 0.15*  --   CREATININE 1.02*  --   --   --  0.74  TROPONINIHS 161*  --  155*  --  152*    Estimated Creatinine Clearance: 85.6 mL/min (by C-G formula based on SCr of 0.74 mg/dL).   Medications:  - on xarelto 20 mg daily PTA. Pt reports that she has not taken 5 days PTA.  Assessment: Patient is a 46 y.o F with hx HF, LV thrombus, and afib on xarelto PTA who presented to the ED on 02/22/22 with c/o n/v, SOB and CP.  Troponin was found to be elevated.  Chest CT neg for PE but showed defect in the left ventricular apex that could be a small thrombus or mass.  Cardiology team is seeing patient and suspects elevated troponin is secondary to decompensated HF.  She's currently on heparin drip for afib and suspected LV thrombus.  Today, 02/23/2022: - heparin level is therapeutic at 0.33 with rate  increased to 900 units/hr early this morning - cbc stable - no bleeding documented  Goal of Therapy:  Heparin level 0.3-0.7 units/ml Monitor platelets by anticoagulation protocol: Yes   Plan:  - continue heparin drip at 900 units/hr - will recheck another heparin level at 5pm to ensure level is still therapeutic before changing to daily monitoring - monitor for s/sx bleeding  Elan Brainerd P 02/23/2022,10:29 AM

## 2022-02-23 NOTE — Progress Notes (Signed)
ANTICOAGULATION CONSULT NOTE - Follow Up Consult  Pharmacy Consult for heparin>>xarelto Indication: hx atrial fibrillationm LV thrombus on 8/2 echo  Allergies  Allergen Reactions   Shellfish Allergy Swelling    Airway involvement including EMS - Has Epi-Pen   Metformin And Related Diarrhea   Trulicity [Dulaglutide] Nausea And Vomiting and Other (See Comments)    Multiple episodes of vomiting over multiple days   Ibuprofen Other (See Comments)    Per PCP interferes with daily meds (heart failure)    Patient Measurements: Height: '4\' 11"'$  (149.9 cm) Weight: 86.2 kg (190 lb) IBW/kg (Calculated) : 43.2 Heparin Dosing Weight: 64 kg  Vital Signs: Temp: 98 F (36.7 C) (08/02 1347) Temp Source: Oral (08/02 1347) BP: 141/110 (08/02 1702) Pulse Rate: 85 (08/02 1702)  Labs: Recent Labs    02/22/22 1706 02/22/22 1739 02/22/22 1921 02/23/22 0230 02/23/22 0601 02/23/22 1030  HGB  --  11.4*  --   --  11.8*  --   HCT  --  35.6*  --   --  36.5  --   PLT  --  313  --   --  317  --   APTT  --   --  22*  --   --   --   LABPROT  --   --   --   --  14.9  --   INR  --   --   --   --  1.2  --   HEPARINUNFRC  --   --  <0.10* 0.15*  --  0.33  CREATININE 1.02*  --   --   --  0.74  --   TROPONINIHS 161*  --  155*  --  152*  --      Estimated Creatinine Clearance: 85.6 mL/min (by C-G formula based on SCr of 0.74 mg/dL).   Medications:  - on xarelto 20 mg daily PTA. Pt reports that she has not taken 5 days PTA.  Assessment: Patient is a 46 y.o F with hx HF, LV thrombus, and afib on xarelto PTA who presented to the ED on 02/22/22 with c/o n/v, SOB and CP.  Troponin was found to be elevated.  Chest CT neg for PE but showed defect in the left ventricular apex that could be a small thrombus or mass.  Cardiology team is seeing patient and suspects elevated troponin is secondary to decompensated HF.  She's currently on heparin drip for afib and suspected LV thrombus.  Today, 02/23/2022: LV thrombus  at apex 2.2 x 1.4 cm with some mobility on echo 02/23/22 Pharmacy consulted to transition heparin drip to xarelto Xarelto last filled at [facility] 10/19/21 & admission heparin level & INR baseline which documents non-compliance   Plan:  DC heparin drip & labs Xarelto 15 mg po bid with meals x 3 weeks followed by xarelto 20 mg qsupper She will NOT qualify for free 30 day coupon because she has used it before Will educate patient prior to discharge  Eudelia Bunch, Pharm.D 02/23/2022 5:46 PM

## 2022-02-23 NOTE — ED Notes (Signed)
Ordered different food for patient as she did not like anything on meal tray.

## 2022-02-24 DIAGNOSIS — I5043 Acute on chronic combined systolic (congestive) and diastolic (congestive) heart failure: Secondary | ICD-10-CM | POA: Diagnosis not present

## 2022-02-24 DIAGNOSIS — I480 Paroxysmal atrial fibrillation: Secondary | ICD-10-CM | POA: Diagnosis not present

## 2022-02-24 DIAGNOSIS — I214 Non-ST elevation (NSTEMI) myocardial infarction: Secondary | ICD-10-CM | POA: Diagnosis not present

## 2022-02-24 DIAGNOSIS — F339 Major depressive disorder, recurrent, unspecified: Secondary | ICD-10-CM | POA: Diagnosis not present

## 2022-02-24 DIAGNOSIS — D638 Anemia in other chronic diseases classified elsewhere: Secondary | ICD-10-CM | POA: Diagnosis not present

## 2022-02-24 DIAGNOSIS — I5023 Acute on chronic systolic (congestive) heart failure: Secondary | ICD-10-CM | POA: Diagnosis not present

## 2022-02-24 DIAGNOSIS — E1165 Type 2 diabetes mellitus with hyperglycemia: Secondary | ICD-10-CM | POA: Diagnosis not present

## 2022-02-24 LAB — BASIC METABOLIC PANEL
Anion gap: 10 (ref 5–15)
BUN: 15 mg/dL (ref 6–20)
CO2: 25 mmol/L (ref 22–32)
Calcium: 8.9 mg/dL (ref 8.9–10.3)
Chloride: 102 mmol/L (ref 98–111)
Creatinine, Ser: 0.79 mg/dL (ref 0.44–1.00)
GFR, Estimated: >60 mL/min (ref >60)
Glucose, Bld: 211 mg/dL — ABNORMAL HIGH (ref 70–99)
Potassium: 3.5 mmol/L (ref 3.5–5.1)
Sodium: 137 mmol/L (ref 135–145)

## 2022-02-24 LAB — GLUCOSE, CAPILLARY
Glucose-Capillary: 152 mg/dL — ABNORMAL HIGH (ref 70–99)
Glucose-Capillary: 228 mg/dL — ABNORMAL HIGH (ref 70–99)
Glucose-Capillary: 104 mg/dL — ABNORMAL HIGH (ref 70–99)
Glucose-Capillary: 140 mg/dL — ABNORMAL HIGH (ref 70–99)

## 2022-02-24 MED ORDER — LOSARTAN POTASSIUM 25 MG PO TABS
25.0000 mg | ORAL_TABLET | Freq: Every day | ORAL | Status: DC
  Administered 2022-02-24 – 2022-02-28 (×5): 25 mg via ORAL
  Filled 2022-02-24 (×5): qty 1

## 2022-02-24 NOTE — Progress Notes (Signed)
Inpatient Diabetes Program Recommendations  AACE/ADA: New Consensus Statement on Inpatient Glycemic Control (2015)  Target Ranges:  Prepandial:   less than 140 mg/dL      Peak postprandial:   less than 180 mg/dL (1-2 hours)      Critically ill patients:  140 - 180 mg/dL   Lab Results  Component Value Date   GLUCAP 228 (H) 02/24/2022   HGBA1C 12.1 (H) 01/29/2022    Review of Glycemic Control  Latest Reference Range & Units 02/23/22 07:32 02/23/22 11:40 02/23/22 17:01 02/23/22 21:10 02/24/22 07:43 02/24/22 11:18  Glucose-Capillary 70 - 99 mg/dL 188 (H) 264 (H) 107 (H) 126 (H) 152 (H) 228 (H)  (H): Data is abnormally high  Diabetes history: DM 2  Outpatient Diabetes medications:  Jardiance 10 mg daily Novolog 12 units with supper Lantus 36 units q AM Current orders for Inpatient glycemic control:  Semglee 30 units daily Novolog moderate tid with meals and HS Jardiance 10 mg daily  Inpatient Diabetes Program Recommendations:    May consider adding Novolog 3 units tid with meals (hold if patient eats less than 50% or NPO).    Spoke with patient regarding elevated A1C from earlier in July.  She states that she was approved for CGM however she has not heard anything back.  She admits that she is not checking her blood sugars at all. I encouraged her to check her blood sugars at least 2-3 times a day.  She needs new Rx. For glucose meter.  She states she has insulin and needles at home.  Needs very close f/u with PCP.  Will also send message to PCP regarding patient's question on getting CGM (needs preauth. For medicaid).  Explained importance of glucose control.  Patient feels CGM will help a lot.    Thanks,  Adah Perl, RN, BC-ADM Inpatient Diabetes Coordinator Pager [phone]  ([id number])

## 2022-02-24 NOTE — Progress Notes (Signed)
Progress Note  Patient Name: [name] Date of Encounter: 02/24/2022  [facility] Cardiologist: Loralie Champagne, MD   Subjective   Patient feels better today, but not back to her baseline. Continues to have some orthopnea, generalized malaise and fatigue. Also with some lower extremity edema.   Inpatient Medications    Scheduled Meds:  carvedilol  3.125 mg Oral BID WC   digoxin  0.125 mg Oral Daily   empagliflozin  10 mg Oral Daily   furosemide  40 mg Intravenous BID   insulin aspart  0-15 Units Subcutaneous TID WC   insulin aspart  0-5 Units Subcutaneous QHS   insulin glargine-yfgn  30 Units Subcutaneous Q breakfast   Rivaroxaban  15 mg Oral BID WC   Followed by   Derrill Memo ON 03/16/2022] rivaroxaban  20 mg Oral Q supper   venlafaxine XR  37.5 mg Oral Q breakfast   Continuous Infusions:  PRN Meds: acetaminophen **OR** acetaminophen, morphine injection, naLOXone (NARCAN)  injection, ondansetron (ZOFRAN) IV   Vital Signs    Vitals:   02/23/22 1702 02/23/22 2025 02/23/22 2356 02/24/22 0507  BP: (!) 141/110 (!) 138/106 (!) 147/112 (!) 148/109  Pulse: 85 73 87 91  Resp: 16 16 (!) 22 19  Temp:  97.6 F (36.4 C) 97.8 F (36.6 C) 97.8 F (36.6 C)  TempSrc:      SpO2: 97% 100% 98% 99%  Weight:      Height:        Intake/Output Summary (Last 24 hours) at 02/24/2022 0949 Last data filed at 02/24/2022 0000 Gross per 24 hour  Intake --  Output 1300 ml  Net -1300 ml      02/22/2022    5:02 PM 02/10/2022    2:24 PM 02/02/2022    3:10 PM  Last 3 Weights  Weight (lbs) 190 lb 190 lb 2 oz 190 lb  Weight (kg) 86.183 kg 86.24 kg 86.183 kg      Telemetry    Sinus rhythm, HR in the 80s-90s - Personally Reviewed  ECG    No new tracings - Personally Reviewed  Physical Exam   GEN: No acute distress. Resting comfortably in the bed with head elevated  Neck: No JVD Cardiac: RRR, no murmurs, rubs, or gallops.  Respiratory: Fine crackles in lung bases, otherwise clear to  auscultation bilaterally. GI: Soft,  mildly tender to palpation throughout  MS: Trace edema; No deformity. Neuro:  Nonfocal  Psych: Normal affect   Labs    High Sensitivity Troponin:   Recent Labs  Lab 02/22/22 1706 02/22/22 1921 02/23/22 0601  TROPONINIHS 161* 155* 152*     Chemistry Recent Labs  Lab 02/22/22 1706 02/22/22 1921 02/23/22 0601 02/24/22 0311  NA 128*  --  137 137  K 4.2  --  3.7 3.5  CL 96*  --  103 102  CO2 17*  --  25 25  GLUCOSE 585*  --  185* 211*  BUN 12  --  13 15  CREATININE 1.02*  --  0.74 0.79  CALCIUM 8.8*  --  8.9 8.9  MG  --  1.7 2.3  --   PROT  --  7.8 7.3  --   ALBUMIN  --  3.2* 3.1*  --   AST  --  33 26  --   ALT  --  29 26  --   ALKPHOS  --  122 116  --   BILITOT  --  2.4* 1.7*  --  GFRNONAA >60  --  >60 >60  ANIONGAP 15  --  9 10    Lipids No results for input(s): "CHOL", "TRIG", "HDL", "LABVLDL", "LDLCALC", "CHOLHDL" in the last 168 hours.  Hematology Recent Labs  Lab 02/22/22 1739 02/23/22 0601  WBC 10.9* 9.6  RBC 4.66 4.76  HGB 11.4* 11.8*  HCT 35.6* 36.5  MCV 76.4* 76.7*  MCH 24.5* 24.8*  MCHC 32.0 32.3  RDW 14.7 14.6  PLT 313 317   Thyroid No results for input(s): "TSH", "FREET4" in the last 168 hours.  BNP Recent Labs  Lab 02/22/22 1706  BNP 443.3*    DDimer No results for input(s): "DDIMER" in the last 168 hours.   Radiology    ECHOCARDIOGRAM COMPLETE  Result Date: 02/23/2022    ECHOCARDIOGRAM REPORT   Patient Name:   [name] Date of Exam: 02/23/2022 Medical Rec #:  [id number]       Height:       59.0 in Accession #:    [id number]      Weight:       190.0 lb Date of Birth:  [birth year]        BSA:          1.805 m Patient Age:    46 years        BP:           142/101 mmHg Patient Gender: F               HR:           87 bpm. Exam Location:  Inpatient Procedure: 2D Echo, Color Doppler, Cardiac Doppler and Intracardiac            Opacification Agent STAT ECHO Indications:    NSTEMI I21.4  History:         Patient has prior history of Echocardiogram examinations, most                 recent 11/02/2021. CHF, Arrythmias:Atrial Fibrillation,                 Signs/Symptoms:Shortness of Breath; Risk Factors:Hypertension                 and Diabetes. Nonischemic cardiomyopathy.  Sonographer:    Darlina Sicilian RDCS Referring Phys: [id number] [place]  1. 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 Left ventricular ejection fraction, by estimation, is <20%. The left ventricle has severely decreased function. The left ventricle demonstrates global hypokinesis. The left ventricular internal cavity size was mildly dilated. Left ventricular diastolic parameters are consistent with Grade III diastolic dysfunction (restrictive).  2. Right ventricular systolic function is moderately reduced. The right ventricular size is normal. There is mildly elevated pulmonary artery systolic pressure. The estimated right ventricular systolic pressure is 84.6 mmHg.  3. Left atrial size was mildly dilated.  4. The mitral valve is normal in structure. Mild mitral valve regurgitation. No evidence of mitral stenosis.  5. Tricuspid valve regurgitation is mild to moderate.  6. The aortic valve is tricuspid. Aortic valve regurgitation is not visualized. No aortic stenosis is present.  7. The inferior vena cava is normal in size with <50% respiratory variability, suggesting right atrial pressure of 8 mmHg. 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 Left ventricular ejection fraction, by estimation, is <20%. The left ventricle has severely decreased function. The left ventricle demonstrates global hypokinesis. Definity contrast  agent was given IV to delineate the left ventricular endocardial borders. The left ventricular internal cavity size was mildly dilated. There is no left ventricular hypertrophy. Left ventricular diastolic parameters are consistent with Grade III   diastolic dysfunction (restrictive). Right Ventricle: The right ventricular size is normal. No increase in right ventricular wall thickness. Right ventricular systolic function is moderately reduced. There is mildly elevated pulmonary artery systolic pressure. The tricuspid regurgitant velocity is 3.01 m/s, and with an assumed right atrial pressure of 8 mmHg, the estimated right ventricular systolic pressure is 97.6 mmHg. Left Atrium: Left atrial size was mildly dilated. Right Atrium: Right atrial size was normal in size. Pericardium: There is no evidence of pericardial effusion. Mitral Valve: The mitral valve is normal in structure. Mild mitral valve regurgitation. No evidence of mitral valve stenosis. Tricuspid Valve: The tricuspid valve is normal in structure. Tricuspid valve regurgitation is mild to moderate. Aortic Valve: The aortic valve is tricuspid. Aortic valve regurgitation is not visualized. No aortic stenosis is present. Pulmonic Valve: The pulmonic valve was normal in structure. Pulmonic valve regurgitation is trivial. Aorta: The aortic root is normal in size and structure. Venous: The inferior vena cava is normal in size with less than 50% respiratory variability, suggesting right atrial pressure of 8 mmHg. IAS/Shunts: No atrial level shunt detected by color flow Doppler.   LV Volumes (MOD) LV vol d, MOD A2C: 172.0 ml Diastology LV vol d, MOD A4C: 158.0 ml LV e' medial:    2.95 cm/s LV vol s, MOD A2C: 152.0 ml LV E/e' medial:  25.7 LV vol s, MOD A4C: 142.0 ml LV e' lateral:   3.09 cm/s LV SV MOD A2C:     20.0 ml  LV E/e' lateral: 24.5 LV SV MOD A4C:     158.0 ml LV SV MOD BP:      16.7 ml RIGHT VENTRICLE RV S prime:     6.66 cm/s TAPSE (M-mode): 1.0 cm LEFT ATRIUM             Index        RIGHT ATRIUM           Index LA Vol (A2C):   55.5 ml 30.76 ml/m  RA Area:     14.80 cm LA Vol (A4C):   63.1 ml 34.97 ml/m  RA Volume:   35.40 ml  19.62 ml/m LA Biplane Vol: 62.1 ml 34.41 ml/m  AORTIC VALVE              PULMONIC VALVE LVOT Vmax:   61.40 cm/s  RVOT Peak grad: 1 mmHg LVOT Vmean:  39.700 cm/s LVOT VTI:    0.072 m  AORTA Ao Asc diam: 2.80 cm MITRAL VALVE               TRICUSPID VALVE MV Area (PHT): 6.90 cm    TR Peak grad:   36.2 mmHg MV Decel Time: 110 msec    TR Mean grad:   21.0 mmHg MV E velocity: 75.85 cm/s  TR Vmax:        301.00 cm/s                            TR Vmean:       209.0 cm/s                             SHUNTS  Systemic VTI: 0.07 m                            Pulmonic VTI: 0.052 m Dalton McleanMD Electronically signed by Franki Monte Signature Date/Time: 02/23/2022/12:50:54 PM    Final    CT Angio Chest PE W and/or Wo Contrast  Result Date: 02/22/2022 CLINICAL DATA:  Abdomen pain chest pain nausea vomiting short of breath EXAM: CT ANGIOGRAPHY CHEST CT ABDOMEN AND PELVIS WITH CONTRAST TECHNIQUE: Multidetector CT imaging of the chest was performed using the standard protocol during bolus administration of intravenous contrast. Multiplanar CT image reconstructions and MIPs were obtained to evaluate the vascular anatomy. Multidetector CT imaging of the abdomen and pelvis was performed using the standard protocol during bolus administration of intravenous contrast. RADIATION DOSE REDUCTION: This exam was performed according to the departmental dose-optimization program which includes automated exposure control, adjustment of the mA and/or kV according to patient size and/or use of iterative reconstruction technique. CONTRAST:  186m OMNIPAQUE IOHEXOL 350 MG/ML SOLN COMPARISON:  Chest x-ray 02/22/2022, CT abdomen pelvis 11/02/2021, CT chest 09/17/2021 FINDINGS: CTA CHEST FINDINGS Cardiovascular: Satisfactory opacification of the pulmonary arteries to the segmental level. No evidence of pulmonary embolism. Cardiomegaly. No pericardial effusion. Nonaneurysmal aorta. On the CT abdomen pelvis images, there is an oval hypodensity near the left ventricular apex.  Mediastinum/Nodes: No enlarged mediastinal, hilar, or axillary lymph nodes. Thyroid gland, trachea, and esophagus demonstrate no significant findings. Lungs/Pleura: Lungs are clear. No pleural effusion or pneumothorax. Musculoskeletal: No chest wall abnormality. No acute or significant osseous findings. Review of the MIP images confirms the above findings. CT ABDOMEN and PELVIS FINDINGS Hepatobiliary: Heterogenous liver attenuation. No focal hepatic abnormality. No calcified gallstone or biliary dilatation. Pancreas: Unremarkable. No pancreatic ductal dilatation or surrounding inflammatory changes. Spleen: Normal in size without focal abnormality. Adrenals/Urinary Tract: Adrenal glands are normal. Kidneys show no hydronephrosis. Cyst in the right kidney. Subcentimeter hypodensities in the left kidney too small to further characterize. The bladder is normal Stomach/Bowel: The stomach is nonenlarged. No dilated small bowel. Negative appendix. No acute bowel wall thickening Vascular/Lymphatic: Mild aortic atherosclerosis. No aneurysm. No suspicious lymph nodes. Reproductive: Prominent uterine fundus. No suspicious adnexal mass. Other: Negative for pelvic effusion or free air. Mild generalized subcutaneous edema. Musculoskeletal: No acute osseous abnormality. Review of the MIP images confirms the above findings. IMPRESSION: 1. Negative for acute pulmonary embolus. Ovoid hypodensity/filling defect within the left ventricular apex seen best on the abdominal images, recommend correlation with echocardiogram to exclude small volume thrombus or mass. 2. Cardiomegaly with reflux of contrast into the hepatic veins consistent with elevated right heart pressures. 3. Heterogenous enhancement of the liver likely due to passive congestion. 4. Mild generalized subcutaneous edema. Electronically Signed   By: KDonavan FoilM.D.   On: 02/22/2022 20:34   CT ABDOMEN PELVIS W CONTRAST  Result Date: 02/22/2022 CLINICAL DATA:  Abdomen  pain chest pain nausea vomiting short of breath EXAM: CT ANGIOGRAPHY CHEST CT ABDOMEN AND PELVIS WITH CONTRAST TECHNIQUE: Multidetector CT imaging of the chest was performed using the standard protocol during bolus administration of intravenous contrast. Multiplanar CT image reconstructions and MIPs were obtained to evaluate the vascular anatomy. Multidetector CT imaging of the abdomen and pelvis was performed using the standard protocol during bolus administration of intravenous contrast. RADIATION DOSE REDUCTION: This exam was performed according to the departmental dose-optimization program which includes automated exposure control, adjustment of the mA and/or kV according to patient  size and/or use of iterative reconstruction technique. CONTRAST:  158m OMNIPAQUE IOHEXOL 350 MG/ML SOLN COMPARISON:  Chest x-ray 02/22/2022, CT abdomen pelvis 11/02/2021, CT chest 09/17/2021 FINDINGS: CTA CHEST FINDINGS Cardiovascular: Satisfactory opacification of the pulmonary arteries to the segmental level. No evidence of pulmonary embolism. Cardiomegaly. No pericardial effusion. Nonaneurysmal aorta. On the CT abdomen pelvis images, there is an oval hypodensity near the left ventricular apex. Mediastinum/Nodes: No enlarged mediastinal, hilar, or axillary lymph nodes. Thyroid gland, trachea, and esophagus demonstrate no significant findings. Lungs/Pleura: Lungs are clear. No pleural effusion or pneumothorax. Musculoskeletal: No chest wall abnormality. No acute or significant osseous findings. Review of the MIP images confirms the above findings. CT ABDOMEN and PELVIS FINDINGS Hepatobiliary: Heterogenous liver attenuation. No focal hepatic abnormality. No calcified gallstone or biliary dilatation. Pancreas: Unremarkable. No pancreatic ductal dilatation or surrounding inflammatory changes. Spleen: Normal in size without focal abnormality. Adrenals/Urinary Tract: Adrenal glands are normal. Kidneys show no hydronephrosis. Cyst in the  right kidney. Subcentimeter hypodensities in the left kidney too small to further characterize. The bladder is normal Stomach/Bowel: The stomach is nonenlarged. No dilated small bowel. Negative appendix. No acute bowel wall thickening Vascular/Lymphatic: Mild aortic atherosclerosis. No aneurysm. No suspicious lymph nodes. Reproductive: Prominent uterine fundus. No suspicious adnexal mass. Other: Negative for pelvic effusion or free air. Mild generalized subcutaneous edema. Musculoskeletal: No acute osseous abnormality. Review of the MIP images confirms the above findings. IMPRESSION: 1. Negative for acute pulmonary embolus. Ovoid hypodensity/filling defect within the left ventricular apex seen best on the abdominal images, recommend correlation with echocardiogram to exclude small volume thrombus or mass. 2. Cardiomegaly with reflux of contrast into the hepatic veins consistent with elevated right heart pressures. 3. Heterogenous enhancement of the liver likely due to passive congestion. 4. Mild generalized subcutaneous edema. Electronically Signed   By: KDonavan FoilM.D.   On: 02/22/2022 20:34   DG Chest 2 View  Result Date: 02/22/2022 CLINICAL DATA:  Shortness of breath EXAM: CHEST - 2 VIEW COMPARISON:  01/28/2022 FINDINGS: Unchanged cardiomegaly. Mild bibasilar atelectasis, similar to the prior. No new focal pulmonary opacity. No pleural effusion or pneumothorax. No acute osseous abnormality. IMPRESSION: Cardiomegaly without additional acute cardiopulmonary process. Electronically Signed   By: AMerilyn BabaM.D.   On: 02/22/2022 18:06    Cardiac Studies   Echocardiogram 02/23/22 1. 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 Left  ventricular ejection fraction, by estimation, is <20%. The left ventricle  has severely decreased function. The left ventricle demonstrates global  hypokinesis. The left ventricular  internal cavity size was mildly dilated. Left ventricular diastolic   parameters are consistent with Grade III diastolic dysfunction  (restrictive).   2. Right ventricular systolic function is moderately reduced. The right  ventricular size is normal. There is mildly elevated pulmonary artery  systolic pressure. The estimated right ventricular systolic pressure is  494.8mmHg.   3. Left atrial size was mildly dilated.   4. The mitral valve is normal in structure. Mild mitral valve  regurgitation. No evidence of mitral stenosis.   5. Tricuspid valve regurgitation is mild to moderate.   6. The aortic valve is tricuspid. Aortic valve regurgitation is not  visualized. No aortic stenosis is present.   7. The inferior vena cava is normal in size with <50% respiratory  variability, suggesting right atrial pressure of 8 mmHg.   Patient Profile     46y.o. 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 Darrick Meigs  Assessment & Plan    Acute on Chronic Combined Systolic and Diastolic Heart Failure Nonischemic cardiomyopathy  - Echo this admission with EF <20%, grade III diastolic dysfunction, moderately reduced RV systolic function  - R/L heart catheterization from 09/20/21 showed mild, nonobstructive CAD.  - cMRI from 09/20/21 showed no myocardial LGE, so no definitive evidence for prior MI, infiltrative disease, myocarditis  - Now, presenting complaining of N/V, generalized malaise, dyspnea, swelling. BNP elevated to 443.3. CT chest/abd with cardiomegaly with reflux of contrast into the hepatic veins, consistent with elevated right heart pressures, heterogenous enhancement of the liver likely due to passive congestion - Given 2 dosed of IV lasix 80 mg, now on IV lasix 40 mg BID-- output 1.3 L urine yesterday and is currently net -2.2 L since admission. Renal  function stable. Body habitus makes it difficult to assess volume status, will closely follow I/Os, weights, renal function to guide diuresis. Continue IV for now, suspect she can be transitioned to oral medications tomorrow    - Continue carvedilol at 3.25 mg (dose reduced as patient is decompensated and has questionable compliance with medications), tolerating well  - Continue home spiro and jardiance  - Historically has been on entresto but it was stopped due to low BP. 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 If she follows up consistently with cardiology and is compliant with her medications, may restart coumadin in the further.    Elevated Troponin  - hsTn 161>>155>>152. This flat trend is not consistent with ACS.  - Cath in 08/2021 showed mild, nonobstructive CAD  - Suspect demand ischemia in the setting of decompensated CHF   Paroxymal Atrial Fibrillation  - Maintaining sinus rhythm per ekg, telemetry  - Continue carvedilol - Continue xarelto    Otherwise  - Hyperglycemia, type 2 DM (A1c 12.1% on 01/29/22) - Generalize abdominal discomfort -- lipase 313 - Anemia - Depression       For questions or updates, please contact [place] Please consult [email or web address] for contact info under        Signed, Margie Billet, PA-C  02/24/2022, 9:49 AM

## 2022-02-24 NOTE — Progress Notes (Signed)
[facility]  PROGRESS NOTE  [name] KNL:[id number] DOB: January 10, 1976 DOA: 02/22/2022 PCP: Darci Current, DO   Brief HPI:   46 year old female with medical history of chronic systolic/diastolic heart failure with LVEF 20 to 25%, felt to be nonischemic cardiomyopathy, paroxysmal atrial fibrillation on Xarelto, diabetes mellitus type 2, anemia of chronic disease, baseline hemoglobin 11-13, essential hypertension who was admitted to [facility] hospital on 02/22/2022 with acute on chronic systolic/diastolic heart failure, after she presented with shortness of breath. Patient was given IV Lasix, troponin elevation 161, 155 Cardiology consulted for combined systolic and diastolic CHF exacerbation as well as suspected type II NSTEMI    Subjective   Patient seen and examined, denies shortness of breath.  Diuresed well with IV Lasix.   Assessment/Plan:    Acute on chronic systolic/diastolic heart failure -Most recent echocardiogram in April 2023 showed EF of 20 to 25% with grade 3 diastolic dysfunction -Felt to be nonischemic cardiomyopathy per cardiology -Cardiology was consulted -Patient started on Lasix 80 mg IV twice daily; changed to 40 mg IV twice daily per cardiology -Continue Coreg 3.125 mg twice daily -Continue Jardiance  LV thrombus -Echocardiogram obtained today shows LV thrombus at apex 2.2 x 1.4 cm with some mobility -Patient was initially started on IV heparin -IV heparin was switched to Xarelto as per cardiology recommendation -Cardiology follow-up as outpatient to consider Coumadin as outpatient  Paroxysmal atrial fibrillation -Patient in sinus rhythm -Continue Coreg, digoxin 0.125 mg daily -IV heparin has been switched to Xarelto as above  Uncontrolled diabetes mellitus type 2 -Hemoglobin A1c elevated at 12.1 -Started on sliding scale insulin, moderate -CBG well controlled   ?  NSTEMI -Patient has elevated troponin 161, 155 -EKG showed sinus tachycardia  without acute ischemic changes -CT chest showed no evidence of PE -Initially started on heparin -Cardiology consulted, did not feel it was a NSTEMI -Troponin elevation likely in the setting of CHF exacerbation -IV heparin has been discontinued and patient started on Xarelto as above  Abdominal pain -Likely in the setting of congestive hepatopathy -CT abdomen shows massive hepatic congestion in setting of acute on chronic systolic/diastolic heart failure -Continue IV diuresis  Anemia of chronic disease -Hemoglobin is stable at 11.8  Depression -Continue Wellbutrin  Medications     carvedilol  3.125 mg Oral BID WC   digoxin  0.125 mg Oral Daily   empagliflozin  10 mg Oral Daily   furosemide  40 mg Intravenous BID   insulin aspart  0-15 Units Subcutaneous TID WC   insulin aspart  0-5 Units Subcutaneous QHS   insulin glargine-yfgn  30 Units Subcutaneous Q breakfast   losartan  25 mg Oral Daily   Rivaroxaban  15 mg Oral BID WC   Followed by   Derrill Memo ON 03/16/2022] rivaroxaban  20 mg Oral Q supper   venlafaxine XR  37.5 mg Oral Q breakfast     Data Reviewed:   CBG:  Recent Labs  Lab 02/23/22 1140 02/23/22 1701 02/23/22 2110 02/24/22 0743 02/24/22 1118  GLUCAP 264* 107* 126* 152* 228*    SpO2: 100 %    Vitals:   02/23/22 2025 02/23/22 2356 02/24/22 0507 02/24/22 1243  BP: (!) 138/106 (!) 147/112 (!) 148/109 (!) 150/108  Pulse: 73 87 91 97  Resp: 16 (!) '22 19 16  '$ Temp: 97.6 F (36.4 C) 97.8 F (36.6 C) 97.8 F (36.6 C) 98 F (36.7 C)  TempSrc:    Oral  SpO2: 100% 98% 99% 100%  Weight:      Height:          Data Reviewed:  Basic Metabolic Panel: Recent Labs  Lab 02/22/22 1706 02/22/22 1921 02/23/22 0601 02/24/22 0311  NA 128*  --  137 137  K 4.2  --  3.7 3.5  CL 96*  --  103 102  CO2 17*  --  25 25  GLUCOSE 585*  --  185* 211*  BUN 12  --  13 15  CREATININE 1.02*  --  0.74 0.79  CALCIUM 8.8*  --  8.9 8.9  MG  --  1.7 2.3  --   PHOS  --    --  4.7*  --     CBC: Recent Labs  Lab 02/22/22 1739 02/23/22 0601  WBC 10.9* 9.6  NEUTROABS 7.7 5.1  HGB 11.4* 11.8*  HCT 35.6* 36.5  MCV 76.4* 76.7*  PLT 313 317    LFT Recent Labs  Lab 02/22/22 1921 02/23/22 0601  AST 33 26  ALT 29 26  ALKPHOS 122 116  BILITOT 2.4* 1.7*  PROT 7.8 7.3  ALBUMIN 3.2* 3.1*     Antibiotics: Anti-infectives (From admission, onward)    None        DVT prophylaxis: Xarelto  Code Status: Full code  Family Communication: No family at bedside   CONSULTS cardiology   Objective    Physical Examination:   General-appears in no acute distress Heart-S1-S2, regular, no murmur auscultated Lungs-clear to auscultation bilaterally, no wheezing or crackles auscultated Abdomen-soft, nontender, no organomegaly Extremities-no edema in the lower extremities Neuro-alert, oriented x3, no focal deficit noted  Status is: Inpatient: CHF exacerbation         [place]   [facility] If 7PM-7AM, please contact night-coverage at [email or web address], Office  [phone]   02/24/2022, 1:09 PM  LOS: 2 days

## 2022-02-24 NOTE — Discharge Instructions (Addendum)
Information on my medicine - XARELTO (rivaroxaban)  WHY WAS XARELTO PRESCRIBED FOR YOU? Xarelto was prescribed to treat a blood clot found in left ventricle of the heart and to reduce the risk of blood clots occurring again.  What do you need to know about Xarelto? The starting dose is one 15 mg tablet taken TWICE daily with food for the FIRST 21 DAYS then on 03/16/2022 the dose is changed to one 20 mg tablet taken ONCE A DAY with your evening meal.  DO NOT stop taking Xarelto without talking to the health care provider who prescribed the medication.  Refill your prescription for 20 mg tablets before you run out.  After discharge, you should have regular check-up appointments with your healthcare provider that is prescribing your Xarelto.  In the future your dose may need to be changed if your kidney function changes by a significant amount.  What do you do if you miss a dose? If you are taking Xarelto TWICE DAILY and you miss a dose, take it as soon as you remember. You may take two 15 mg tablets (total 30 mg) at the same time then resume your regularly scheduled 15 mg twice daily the next day.  If you are taking Xarelto ONCE DAILY and you miss a dose, take it as soon as you remember on the same day then continue your regularly scheduled once daily regimen the next day. Do not take two doses of Xarelto at the same time.   Important Safety Information Xarelto is a blood thinner medicine that can cause bleeding. You should call your healthcare provider right away if you experience any of the following: Bleeding from an injury or your nose that does not stop. Unusual colored urine (red or dark brown) or unusual colored stools (red or black). Unusual bruising for unknown reasons. A serious fall or if you hit your head (even if there is no bleeding).  Some medicines may interact with Xarelto and might increase your risk of bleeding while on Xarelto. To help avoid this, consult your  healthcare provider or pharmacist prior to using any new prescription or non-prescription medications, including herbals, vitamins, non-steroidal anti-inflammatory drugs (NSAIDs) and supplements.  This website has more information on Xarelto: [email or web address]

## 2022-02-24 NOTE — TOC Initial Note (Signed)
Transition of Care [facility]) - Initial/Assessment Note    Patient Details  Name: [name] MRN: [id number] Date of Birth: [birth year]  [facility]) CM/SW Contact:    Leeroy Cha, RN Phone Number: 02/24/2022, 7:41 AM  Clinical Narrative:                  Transition of Care [facility]) Screening Note   Patient Details  Name: [name] Date of Birth: 28-Apr-1976   Transition of Care [facility]) CM/SW Contact:    Leeroy Cha, RN Phone Number: 02/24/2022, 7:41 AM    Transition of Care Department [facility] has reviewed patient and no TOC needs have been identified at this time. We will continue to monitor patient advancement through interdisciplinary progression rounds. If new patient transition needs arise, please place a TOC consult.    Expected Discharge Plan: Home/Self Care Barriers to Discharge: Continued Medical Work up   Patient Goals and CMS Choice Patient states their goals for this hospitalization and ongoing recovery are:: to go home CMS Medicare.gov Compare Post Acute Care list provided to:: Patient Choice offered to / list presented to : Patient  Expected Discharge Plan and Services Expected Discharge Plan: Home/Self Care   Discharge Planning Services: CM Consult   Living arrangements for the past 2 months: Apartment                                      Prior Living Arrangements/Services Living arrangements for the past 2 months: Apartment Lives with:: Self Patient language and need for interpreter reviewed:: Yes Do you feel safe going back to the place where you live?: Yes            Criminal Activity/Legal Involvement Pertinent to Current Situation/Hospitalization: No - Comment as needed  Activities of Daily Living Home Assistive Devices/Equipment: Eyeglasses ADL Screening (condition at time of admission) Patient's cognitive ability adequate to safely complete daily activities?: Yes Is the patient deaf or have difficulty  hearing?: No Does the patient have difficulty seeing, even when wearing glasses/contacts?: No Does the patient have difficulty concentrating, remembering, or making decisions?: No Patient able to express need for assistance with ADLs?: Yes Does the patient have difficulty dressing or bathing?: Yes Independently performs ADLs?: No Communication: Independent Dressing (OT): Needs assistance Is this a change from baseline?: Change from baseline, expected to last <3days Grooming: Independent Feeding: Independent Bathing: Needs assistance Is this a change from baseline?: Change from baseline, expected to last <3 days Toileting: Needs assistance Is this a change from baseline?: Change from baseline, expected to last <3 days In/Out Bed: Needs assistance Is this a change from baseline?: Change from baseline, expected to last <3 days Walks in Home: Needs assistance Is this a change from baseline?: Change from baseline, expected to last <3 days Does the patient have difficulty walking or climbing stairs?: Yes Weakness of Legs: Both Weakness of Arms/Hands: Both  Permission Sought/Granted                  Emotional Assessment Appearance:: Appears stated age Attitude/Demeanor/Rapport: Engaged Affect (typically observed): Calm Orientation: : Oriented to Self, Oriented to Place, Oriented to  Time, Oriented to Situation Alcohol / Substance Use: Never Used Psych Involvement: No (comment)  Admission diagnosis:  Acute on chronic systolic heart failure (HCC) [I50.23] NSTEMI (non-ST elevated myocardial infarction) ([place]) [I21.4] Acute on chronic HFrEF (heart failure with reduced ejection  fraction) ([place]) [I50.23] Patient Active Problem List   Diagnosis Date Noted   NSTEMI (non-ST elevated myocardial infarction) ([place]) 02/23/2022   Acute on chronic systolic heart failure ([place]) 02/22/2022   Acute heart failure ([place]) 01/29/2022   Nausea and vomiting    Dizziness 11/02/2021   CHF (congestive  heart failure) ([place]) 11/02/2021   Healthcare maintenance 10/24/2021   Depression, recurrent ([place])    Tachycardia    Nonischemic cardiomyopathy ([place]) 09/22/2021   Nonobstructive atherosclerosis of coronary artery 09/22/2021   Encounter for therapeutic drug monitoring 10/07/2019   Chronic chest wall pain 03/27/2019   Abdominal pain    Acute combined systolic and diastolic heart failure ([place]) 02/19/2019   Hyperglycemia    Biventricular failure ([place]) 02/06/2019   Morbid obesity ([place]) 02/06/2019   Chronic combined systolic and diastolic heart failure ([place]) 12/31/2018   Paroxysmal atrial fibrillation ([place]) 12/31/2018   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/31/2018   Chronic nausea 12/27/2018   Hyperlipidemia associated with type 2 diabetes mellitus ([place]) 10/30/2017   Obstructive sleep apnea 06/30/2015   Shortness of breath 06/29/2015   Anemia of chronic disease 07/22/2013   Anemia due to blood loss, chronic 06/27/2012   Allergic rhinitis 06/27/2012   Major depressive disorder 02/08/2012   Hypertension associated with diabetes ([place]) 12/04/2011   Poorly controlled type 2 diabetes mellitus ([place]) 05/19/2011   PCOS (polycystic ovarian syndrome) 05/19/2011   PCP:  Darci Current, DO [facility]acy:   [facility] [phone] [place], [place] [address] Phone: [phone] Fax: [phone]  [facility] [place] [address] Phone: [phone] Fax: [place], [place] [address] Phone: [phone] Fax: [phone]  [facility] [address] [place] [address] Phone: [phone] Fax: [phone]     Social Determinants of Health (SDOH) Interventions    Readmission Risk Interventions    10/01/2019    12:21 PM  Readmission Risk Prevention Plan  Transportation Screening Complete  PCP or Specialist Appt within 3-5 Days Complete  HRI or [place] Complete  Social Work Consult for [place] Planning/Counseling Complete  Palliative Care Screening Not Applicable

## 2022-02-25 DIAGNOSIS — I480 Paroxysmal atrial fibrillation: Secondary | ICD-10-CM | POA: Diagnosis not present

## 2022-02-25 DIAGNOSIS — I5023 Acute on chronic systolic (congestive) heart failure: Secondary | ICD-10-CM | POA: Diagnosis not present

## 2022-02-25 DIAGNOSIS — F339 Major depressive disorder, recurrent, unspecified: Secondary | ICD-10-CM | POA: Diagnosis not present

## 2022-02-25 DIAGNOSIS — I5043 Acute on chronic combined systolic (congestive) and diastolic (congestive) heart failure: Secondary | ICD-10-CM | POA: Diagnosis not present

## 2022-02-25 DIAGNOSIS — I214 Non-ST elevation (NSTEMI) myocardial infarction: Secondary | ICD-10-CM | POA: Diagnosis not present

## 2022-02-25 DIAGNOSIS — E1165 Type 2 diabetes mellitus with hyperglycemia: Secondary | ICD-10-CM | POA: Diagnosis not present

## 2022-02-25 DIAGNOSIS — D638 Anemia in other chronic diseases classified elsewhere: Secondary | ICD-10-CM | POA: Diagnosis not present

## 2022-02-25 DIAGNOSIS — R1084 Generalized abdominal pain: Secondary | ICD-10-CM | POA: Diagnosis not present

## 2022-02-25 LAB — BASIC METABOLIC PANEL
Anion gap: 9 (ref 5–15)
BUN: 16 mg/dL (ref 6–20)
CO2: 30 mmol/L (ref 22–32)
Calcium: 9 mg/dL (ref 8.9–10.3)
Chloride: 99 mmol/L (ref 98–111)
Creatinine, Ser: 0.9 mg/dL (ref 0.44–1.00)
GFR, Estimated: >60 mL/min (ref >60)
Glucose, Bld: 119 mg/dL — ABNORMAL HIGH (ref 70–99)
Potassium: 3.5 mmol/L (ref 3.5–5.1)
Sodium: 138 mmol/L (ref 135–145)

## 2022-02-25 LAB — GLUCOSE, CAPILLARY
Glucose-Capillary: 109 mg/dL — ABNORMAL HIGH (ref 70–99)
Glucose-Capillary: 197 mg/dL — ABNORMAL HIGH (ref 70–99)
Glucose-Capillary: 132 mg/dL — ABNORMAL HIGH (ref 70–99)
Glucose-Capillary: 224 mg/dL — ABNORMAL HIGH (ref 70–99)

## 2022-02-25 MED ORDER — PANTOPRAZOLE SODIUM 40 MG PO TBEC
40.0000 mg | DELAYED_RELEASE_TABLET | Freq: Every day | ORAL | Status: DC
  Administered 2022-02-25 – 2022-02-27 (×3): 40 mg via ORAL
  Filled 2022-02-25 (×3): qty 1

## 2022-02-25 NOTE — Progress Notes (Signed)
[facility]  PROGRESS NOTE  [name]:[id number] DOB: 1976-05-14 DOA: 02/22/2022 PCP: Darci Current, DO   Brief HPI:   46 year old female with medical history of chronic systolic/diastolic heart failure with LVEF 20 to 25%, felt to be nonischemic cardiomyopathy, paroxysmal atrial fibrillation on Xarelto, diabetes mellitus type 2, anemia of chronic disease, baseline hemoglobin 11-13, essential hypertension who was admitted to [facility] on 02/22/2022 with acute on chronic systolic/diastolic heart failure, after she presented with shortness of breath. Patient was given IV Lasix, troponin elevation 161, 155 Cardiology consulted for combined systolic and diastolic CHF exacerbation as well as suspected type II NSTEMI    Subjective   Patient seen and examined, diuresing well with IV Lasix.  Denies shortness of breath.   Assessment/Plan:    Acute on chronic systolic/diastolic heart failure -Most recent echocardiogram in April 2023 showed EF of 20 to 25% with grade 3 diastolic dysfunction -Felt to be nonischemic cardiomyopathy per cardiology -Cardiology was consulted -Patient started on Lasix 80 mg IV twice daily; changed to 40 mg IV twice daily per cardiology -We will continue with IV Lasix for 1 more day as per cardiology recommendation.  Creatinine stable at 0.90. -Continue Coreg 3.125 mg twice daily -Continue Jardiance  LV thrombus -Echocardiogram obtained today shows LV thrombus at apex 2.2 x 1.4 cm with some mobility -Patient was initially started on IV heparin -IV heparin was switched to Xarelto as per cardiology recommendation -Cardiology follow-up as outpatient to consider Coumadin as outpatient  Paroxysmal atrial fibrillation -Patient in sinus rhythm -Continue Coreg, digoxin 0.125 mg daily -IV heparin has been switched to Xarelto as above  Uncontrolled diabetes mellitus type 2 -Hemoglobin A1c elevated at 12.1 -Started on sliding scale insulin,  moderate -CBG well controlled   ?  NSTEMI -Patient has elevated troponin 161, 155 -EKG showed sinus tachycardia without acute ischemic changes -CT chest showed no evidence of PE -Initially started on heparin -Cardiology consulted, did not feel it was a NSTEMI -Troponin elevation likely in the setting of CHF exacerbation -IV heparin has been discontinued and patient started on Xarelto as above  Abdominal pain -Likely in the setting of congestive hepatopathy -CT abdomen shows massive hepatic congestion in setting of acute on chronic systolic/diastolic heart failure -Continue IV diuresis  Anemia of chronic disease -Hemoglobin is stable at 11.8  Depression -Continue Wellbutrin  Medications     carvedilol  3.125 mg Oral BID WC   digoxin  0.125 mg Oral Daily   empagliflozin  10 mg Oral Daily   furosemide  40 mg Intravenous BID   insulin aspart  0-15 Units Subcutaneous TID WC   insulin aspart  0-5 Units Subcutaneous QHS   insulin glargine-yfgn  30 Units Subcutaneous Q breakfast   losartan  25 mg Oral Daily   pantoprazole  40 mg Oral Q1200   Rivaroxaban  15 mg Oral BID WC   Followed by   Derrill Memo ON 03/16/2022] rivaroxaban  20 mg Oral Q supper   venlafaxine XR  37.5 mg Oral Q breakfast     Data Reviewed:   CBG:  Recent Labs  Lab 02/24/22 1118 02/24/22 1721 02/24/22 2309 02/25/22 0759 02/25/22 1235  GLUCAP 228* 104* 140* 109* 197*    SpO2: 100 %    Vitals:   02/23/22 2356 02/24/22 0507 02/24/22 1243 02/25/22 0439  BP: (!) 147/112 (!) 148/109 (!) 150/108 (!) 139/98  Pulse: 87 91 97 81  Resp: (!) '22 19 16 18  '$ Temp: 97.8 F (  36.6 C) 97.8 F (36.6 C) 98 F (36.7 C) 97.6 F (36.4 C)  TempSrc:   Oral Oral  SpO2: 98% 99% 100% 100%  Weight:      Height:          Data Reviewed:  Basic Metabolic Panel: Recent Labs  Lab 02/22/22 1706 02/22/22 1921 02/23/22 0601 02/24/22 0311 02/25/22 0826  NA 128*  --  137 137 138  K 4.2  --  3.7 3.5 3.5  CL 96*  --   103 102 99  CO2 17*  --  '25 25 30  '$ GLUCOSE 585*  --  185* 211* 119*  BUN 12  --  '13 15 16  '$ CREATININE 1.02*  --  0.74 0.79 0.90  CALCIUM 8.8*  --  8.9 8.9 9.0  MG  --  1.7 2.3  --   --   PHOS  --   --  4.7*  --   --     CBC: Recent Labs  Lab 02/22/22 1739 02/23/22 0601  WBC 10.9* 9.6  NEUTROABS 7.7 5.1  HGB 11.4* 11.8*  HCT 35.6* 36.5  MCV 76.4* 76.7*  PLT 313 317    LFT Recent Labs  Lab 02/22/22 1921 02/23/22 0601  AST 33 26  ALT 29 26  ALKPHOS 122 116  BILITOT 2.4* 1.7*  PROT 7.8 7.3  ALBUMIN 3.2* 3.1*     Antibiotics: Anti-infectives (From admission, onward)    None        DVT prophylaxis: Xarelto  Code Status: Full code  Family Communication: No family at bedside   CONSULTS cardiology   Objective    Physical Examination:   General-appears in no acute distress Heart-S1-S2, regular, no murmur auscultated Lungs-clear to auscultation bilaterally, no wheezing or crackles auscultated Abdomen-soft, nontender, no organomegaly Extremities-trace  edema in the lower extremities Neuro-alert, oriented x3, no focal deficit noted  Status is: Inpatient: CHF exacerbation         [place]   [facility] If 7PM-7AM, please contact night-coverage at [email or web address], Office  [phone]   02/25/2022, 12:56 PM  LOS: 3 days

## 2022-02-25 NOTE — Progress Notes (Signed)
Progress Note  Patient Name: [name] Date of Encounter: 02/25/2022  [facility] Cardiologist: Loralie Champagne, MD   Subjective   Patient continues to have a band-like tightness across her chest, worse with deep breathing.   Inpatient Medications    Scheduled Meds:  carvedilol  3.125 mg Oral BID WC   digoxin  0.125 mg Oral Daily   empagliflozin  10 mg Oral Daily   furosemide  40 mg Intravenous BID   insulin aspart  0-15 Units Subcutaneous TID WC   insulin aspart  0-5 Units Subcutaneous QHS   insulin glargine-yfgn  30 Units Subcutaneous Q breakfast   losartan  25 mg Oral Daily   pantoprazole  40 mg Oral Q1200   Rivaroxaban  15 mg Oral BID WC   Followed by   Derrill Memo ON 03/16/2022] rivaroxaban  20 mg Oral Q supper   venlafaxine XR  37.5 mg Oral Q breakfast   Continuous Infusions:  PRN Meds: acetaminophen **OR** acetaminophen, morphine injection, naLOXone (NARCAN)  injection, ondansetron (ZOFRAN) IV   Vital Signs    Vitals:   02/23/22 2356 02/24/22 0507 02/24/22 1243 02/25/22 0439  BP: (!) 147/112 (!) 148/109 (!) 150/108 (!) 139/98  Pulse: 87 91 97 81  Resp: (!) '22 19 16 18  '$ Temp: 97.8 F (36.6 C) 97.8 F (36.6 C) 98 F (36.7 C) 97.6 F (36.4 C)  TempSrc:   Oral Oral  SpO2: 98% 99% 100% 100%  Weight:      Height:        Intake/Output Summary (Last 24 hours) at 02/25/2022 0846 Last data filed at 02/25/2022 0300 Gross per 24 hour  Intake --  Output 3450 ml  Net -3450 ml      02/22/2022    5:02 PM 02/10/2022    2:24 PM 02/02/2022    3:10 PM  Last 3 Weights  Weight (lbs) 190 lb 190 lb 2 oz 190 lb  Weight (kg) 86.183 kg 86.24 kg 86.183 kg      Telemetry    Sinus rhythm  - Personally Reviewed  ECG    No new tracings since 8/1 - Personally Reviewed  Physical Exam   GEN: No acute distress. Sitting comfortably in the bed   Neck: No JVD Cardiac: RRR, no murmurs, rubs, or gallops.  Respiratory: Clear to auscultation bilaterally. GI: Soft, nontender,  non-distended  MS: No edema in BLE. Right left is tender to palpation, without swelling warmth or redness.  Neuro:  Nonfocal  Psych: Normal affect   Labs    High Sensitivity Troponin:   Recent Labs  Lab 02/22/22 1706 02/22/22 1921 02/23/22 0601  TROPONINIHS 161* 155* 152*     Chemistry Recent Labs  Lab 02/22/22 1706 02/22/22 1921 02/23/22 0601 02/24/22 0311  NA 128*  --  137 137  K 4.2  --  3.7 3.5  CL 96*  --  103 102  CO2 17*  --  25 25  GLUCOSE 585*  --  185* 211*  BUN 12  --  13 15  CREATININE 1.02*  --  0.74 0.79  CALCIUM 8.8*  --  8.9 8.9  MG  --  1.7 2.3  --   PROT  --  7.8 7.3  --   ALBUMIN  --  3.2* 3.1*  --   AST  --  33 26  --   ALT  --  29 26  --   ALKPHOS  --  122 116  --   BILITOT  --  2.4* 1.7*  --   GFRNONAA >60  --  >60 >60  ANIONGAP 15  --  9 10    Lipids No results for input(s): "CHOL", "TRIG", "HDL", "LABVLDL", "LDLCALC", "CHOLHDL" in the last 168 hours.  Hematology Recent Labs  Lab 02/22/22 1739 02/23/22 0601  WBC 10.9* 9.6  RBC 4.66 4.76  HGB 11.4* 11.8*  HCT 35.6* 36.5  MCV 76.4* 76.7*  MCH 24.5* 24.8*  MCHC 32.0 32.3  RDW 14.7 14.6  PLT 313 317   Thyroid No results for input(s): "TSH", "FREET4" in the last 168 hours.  BNP Recent Labs  Lab 02/22/22 1706  BNP 443.3*    DDimer No results for input(s): "DDIMER" in the last 168 hours.   Radiology    ECHOCARDIOGRAM COMPLETE  Result Date: 02/23/2022    ECHOCARDIOGRAM REPORT   Patient Name:   [name] Date of Exam: 02/23/2022 Medical Rec #:  [id number]       Height:       59.0 in Accession #:    [id number]      Weight:       190.0 lb Date of Birth:  [birth year]        BSA:          1.805 m Patient Age:    46 years        BP:           142/101 mmHg Patient Gender: F               HR:           87 bpm. Exam Location:  Inpatient Procedure: 2D Echo, Color Doppler, Cardiac Doppler and Intracardiac            Opacification Agent STAT ECHO Indications:    NSTEMI I21.4  History:         Patient has prior history of Echocardiogram examinations, most                 recent 11/02/2021. CHF, Arrythmias:Atrial Fibrillation,                 Signs/Symptoms:Shortness of Breath; Risk Factors:Hypertension                 and Diabetes. Nonischemic cardiomyopathy.  Sonographer:    Darlina Sicilian RDCS Referring Phys: [id number] [place]  1. 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 Left ventricular ejection fraction, by estimation, is <20%. The left ventricle has severely decreased function. The left ventricle demonstrates global hypokinesis. The left ventricular internal cavity size was mildly dilated. Left ventricular diastolic parameters are consistent with Grade III diastolic dysfunction (restrictive).  2. Right ventricular systolic function is moderately reduced. The right ventricular size is normal. There is mildly elevated pulmonary artery systolic pressure. The estimated right ventricular systolic pressure is 17.6 mmHg.  3. Left atrial size was mildly dilated.  4. The mitral valve is normal in structure. Mild mitral valve regurgitation. No evidence of mitral stenosis.  5. Tricuspid valve regurgitation is mild to moderate.  6. The aortic valve is tricuspid. Aortic valve regurgitation is not visualized. No aortic stenosis is present.  7. The inferior vena cava is normal in size with <50% respiratory variability, suggesting right atrial pressure of 8 mmHg. 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 Left ventricular ejection fraction, by estimation, is <20%. The left ventricle has severely decreased function. The left  ventricle demonstrates global hypokinesis. Definity contrast agent was given IV to delineate the left ventricular endocardial borders. The left ventricular internal cavity size was mildly dilated. There is no left ventricular hypertrophy. Left ventricular diastolic parameters are consistent with Grade III   diastolic dysfunction (restrictive). Right Ventricle: The right ventricular size is normal. No increase in right ventricular wall thickness. Right ventricular systolic function is moderately reduced. There is mildly elevated pulmonary artery systolic pressure. The tricuspid regurgitant velocity is 3.01 m/s, and with an assumed right atrial pressure of 8 mmHg, the estimated right ventricular systolic pressure is 62.9 mmHg. Left Atrium: Left atrial size was mildly dilated. Right Atrium: Right atrial size was normal in size. Pericardium: There is no evidence of pericardial effusion. Mitral Valve: The mitral valve is normal in structure. Mild mitral valve regurgitation. No evidence of mitral valve stenosis. Tricuspid Valve: The tricuspid valve is normal in structure. Tricuspid valve regurgitation is mild to moderate. Aortic Valve: The aortic valve is tricuspid. Aortic valve regurgitation is not visualized. No aortic stenosis is present. Pulmonic Valve: The pulmonic valve was normal in structure. Pulmonic valve regurgitation is trivial. Aorta: The aortic root is normal in size and structure. Venous: The inferior vena cava is normal in size with less than 50% respiratory variability, suggesting right atrial pressure of 8 mmHg. IAS/Shunts: No atrial level shunt detected by color flow Doppler.   LV Volumes (MOD) LV vol d, MOD A2C: 172.0 ml Diastology LV vol d, MOD A4C: 158.0 ml LV e' medial:    2.95 cm/s LV vol s, MOD A2C: 152.0 ml LV E/e' medial:  25.7 LV vol s, MOD A4C: 142.0 ml LV e' lateral:   3.09 cm/s LV SV MOD A2C:     20.0 ml  LV E/e' lateral: 24.5 LV SV MOD A4C:     158.0 ml LV SV MOD BP:      16.7 ml RIGHT VENTRICLE RV S prime:     6.66 cm/s TAPSE (M-mode): 1.0 cm LEFT ATRIUM             Index        RIGHT ATRIUM           Index LA Vol (A2C):   55.5 ml 30.76 ml/m  RA Area:     14.80 cm LA Vol (A4C):   63.1 ml 34.97 ml/m  RA Volume:   35.40 ml  19.62 ml/m LA Biplane Vol: 62.1 ml 34.41 ml/m  AORTIC VALVE              PULMONIC VALVE LVOT Vmax:   61.40 cm/s  RVOT Peak grad: 1 mmHg LVOT Vmean:  39.700 cm/s LVOT VTI:    0.072 m  AORTA Ao Asc diam: 2.80 cm MITRAL VALVE               TRICUSPID VALVE MV Area (PHT): 6.90 cm    TR Peak grad:   36.2 mmHg MV Decel Time: 110 msec    TR Mean grad:   21.0 mmHg MV E velocity: 75.85 cm/s  TR Vmax:        301.00 cm/s                            TR Vmean:       209.0 cm/s                             SHUNTS  Systemic VTI: 0.07 m                            Pulmonic VTI: 0.052 m [place] Electronically signed by Franki Monte Signature Date/Time: 02/23/2022/12:50:54 PM    Final     Cardiac Studies   Echocardiogram 02/23/22 1. 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 Left  ventricular ejection fraction, by estimation, is <20%. The left ventricle  has severely decreased function. The left ventricle demonstrates global  hypokinesis. The left ventricular  internal cavity size was mildly dilated. Left ventricular diastolic  parameters are consistent with Grade III diastolic dysfunction  (restrictive).   2. Right ventricular systolic function is moderately reduced. The right  ventricular size is normal. There is mildly elevated pulmonary artery  systolic pressure. The estimated right ventricular systolic pressure is  23.7 mmHg.   3. Left atrial size was mildly dilated.   4. The mitral valve is normal in structure. Mild mitral valve  regurgitation. No evidence of mitral stenosis.   5. Tricuspid valve regurgitation is mild to moderate.   6. The aortic valve is tricuspid. Aortic valve regurgitation is not  visualized. No aortic stenosis is present.   7. The inferior vena cava is normal in size with <50% respiratory  variability, suggesting right atrial pressure of 8 mmHg.   Patient Profile     46 y.o. 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 Darrick Meigs  Assessment & Plan   Acute on Chronic Combined Systolic and Diastolic Heart Failure Nonischemic cardiomyopathy  - Echo this admission with EF <20%, grade III diastolic dysfunction, moderately reduced RV systolic function  - R/L heart catheterization from 09/20/21 showed mild, nonobstructive CAD.  - cMRI from 09/20/21 showed no myocardial LGE, so no definitive evidence for prior MI, infiltrative disease, myocarditis  - Now, presenting complaining of N/V, generalized malaise, dyspnea, swelling. BNP elevated to 443.3. CT chest/abd with cardiomegaly with reflux of contrast into the hepatic veins, consistent with elevated right heart pressures, heterogenous enhancement of the liver likely due to passive congestion - Given 2 dosed of IV lasix 80 mg, now on IV lasix 40 mg BID-- output 3.45 L urine yesterday and is currently net -5.7 L since admission. Body habitus makes it difficult to assess volume status, will closely follow I/Os, weights, renal function to guide diuresis.  - Renal function stable, continue IV diuresis for one more day  - Continue carvedilol at 3.25 mg (dose reduced as patient is decompensated and has questionable compliance with medications), tolerating well  - Continue home spiro and jardiance  - Historically has been on entresto but it was stopped due to low BP. Was started on losartan 25 mg daily  yesterday, BP is tolerating well. 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  If she follows up consistently with cardiology and is compliant with her medications, may restart coumadin in the future.    Elevated Troponin  - hsTn 161>>155>>152. This flat trend is not consistent with ACS.  - Cath in 08/2021 showed mild, nonobstructive CAD  - Suspect demand ischemia in the setting of decompensated CHF   Paroxymal Atrial Fibrillation  - Maintaining sinus rhythm per ekg, telemetry  - Continue carvedilol - Continue xarelto    Otherwise  - Hyperglycemia, type 2 DM (A1c 12.1% on 01/29/22) - Generalize abdominal discomfort -- lipase 313 - Anemia - Depression        For questions or updates, please contact [place] Please consult [email or web address] for contact info under        Signed, Margie Billet, PA-C  02/25/2022, 8:46 AM

## 2022-02-26 DIAGNOSIS — I5023 Acute on chronic systolic (congestive) heart failure: Secondary | ICD-10-CM | POA: Diagnosis not present

## 2022-02-26 DIAGNOSIS — F339 Major depressive disorder, recurrent, unspecified: Secondary | ICD-10-CM | POA: Diagnosis not present

## 2022-02-26 DIAGNOSIS — R1084 Generalized abdominal pain: Secondary | ICD-10-CM | POA: Diagnosis not present

## 2022-02-26 DIAGNOSIS — D638 Anemia in other chronic diseases classified elsewhere: Secondary | ICD-10-CM | POA: Diagnosis not present

## 2022-02-26 DIAGNOSIS — E1165 Type 2 diabetes mellitus with hyperglycemia: Secondary | ICD-10-CM | POA: Diagnosis not present

## 2022-02-26 DIAGNOSIS — I480 Paroxysmal atrial fibrillation: Secondary | ICD-10-CM | POA: Diagnosis not present

## 2022-02-26 DIAGNOSIS — I214 Non-ST elevation (NSTEMI) myocardial infarction: Secondary | ICD-10-CM | POA: Diagnosis not present

## 2022-02-26 LAB — BASIC METABOLIC PANEL
Anion gap: 11 (ref 5–15)
BUN: 17 mg/dL (ref 6–20)
CO2: 29 mmol/L (ref 22–32)
Calcium: 9.1 mg/dL (ref 8.9–10.3)
Chloride: 98 mmol/L (ref 98–111)
Creatinine, Ser: 0.86 mg/dL (ref 0.44–1.00)
GFR, Estimated: >60 mL/min (ref >60)
Glucose, Bld: 87 mg/dL (ref 70–99)
Potassium: 3.6 mmol/L (ref 3.5–5.1)
Sodium: 138 mmol/L (ref 135–145)

## 2022-02-26 LAB — GLUCOSE, CAPILLARY
Glucose-Capillary: 112 mg/dL — ABNORMAL HIGH (ref 70–99)
Glucose-Capillary: 183 mg/dL — ABNORMAL HIGH (ref 70–99)
Glucose-Capillary: 85 mg/dL (ref 70–99)
Glucose-Capillary: 241 mg/dL — ABNORMAL HIGH (ref 70–99)

## 2022-02-26 MED ORDER — FUROSEMIDE 40 MG PO TABS
40.0000 mg | ORAL_TABLET | Freq: Two times a day (BID) | ORAL | Status: DC
  Administered 2022-02-26 (×2): 40 mg via ORAL
  Filled 2022-02-26 (×2): qty 1

## 2022-02-26 NOTE — Progress Notes (Signed)
[facility]  PROGRESS NOTE  [name] OEU:[id number] DOB: July 13, 1976 DOA: 02/22/2022 PCP: Darci Current, DO   Brief HPI:   46 year old female with medical history of chronic systolic/diastolic heart failure with LVEF 20 to 25%, felt to be nonischemic cardiomyopathy, paroxysmal atrial fibrillation on Xarelto, diabetes mellitus type 2, anemia of chronic disease, baseline hemoglobin 11-13, essential hypertension who was admitted to [facility] long hospital on 02/22/2022 with acute on chronic systolic/diastolic heart failure, after she presented with shortness of breath. Patient was given IV Lasix, troponin elevation 161, 155 Cardiology consulted for combined systolic and diastolic CHF exacerbation as well as suspected type II NSTEMI    Subjective   Patient seen and examined, denies shortness of breath.  Abdominal discomfort has improved after starting Protonix.   Assessment/Plan:    Acute on chronic systolic/diastolic heart failure -Most recent echocardiogram in April 2023 showed EF of 20 to 25% with grade 3 diastolic dysfunction -Felt to be nonischemic cardiomyopathy per cardiology -Cardiology was consulted -Patient started on Lasix 80 mg IV twice daily; changed to 40 mg IV twice daily per cardiology -IV Lasix has been changed to p.o. Lasix 40 mg p.o. twice daily as per cardiology -Continue Coreg 3.125 mg twice daily -Continue Jardiance  LV thrombus -Echocardiogram obtained today shows LV thrombus at apex 2.2 x 1.4 cm with some mobility -Patient was initially started on IV heparin -IV heparin was switched to Xarelto as per cardiology recommendation -Cardiology follow-up as outpatient to consider Coumadin as outpatient  Paroxysmal atrial fibrillation -Patient in sinus rhythm -Continue Coreg, digoxin 0.125 mg daily -IV heparin has been switched to Xarelto as above  Uncontrolled diabetes mellitus type 2 -Hemoglobin A1c elevated at 12.1 -Started on sliding scale insulin,  moderate -CBG well controlled   ?  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  Anemia of chronic disease -Hemoglobin is stable at 11.8  Depression -Continue Wellbutrin  Medications     carvedilol  3.125 mg Oral BID WC   digoxin  0.125 mg Oral Daily   empagliflozin  10 mg Oral Daily   furosemide  40 mg Oral BID   insulin aspart  0-15 Units Subcutaneous TID WC   insulin aspart  0-5 Units Subcutaneous QHS   insulin glargine-yfgn  30 Units Subcutaneous Q breakfast   losartan  25 mg Oral Daily   pantoprazole  40 mg Oral Q1200   Rivaroxaban  15 mg Oral BID WC   Followed by   Derrill Memo ON 03/16/2022] rivaroxaban  20 mg Oral Q supper   venlafaxine XR  37.5 mg Oral Q breakfast     Data Reviewed:   CBG:  Recent Labs  Lab 02/25/22 0759 02/25/22 1235 02/25/22 1647 02/25/22 2125 02/26/22 0806  GLUCAP 109* 197* 132* 224* 112*    SpO2: 99 %    Vitals:   02/25/22 1650 02/25/22 2107 02/26/22 0500 02/26/22 0606  BP: 130/89 (!) 118/92  (!) 127/100  Pulse: 84 80  83  Resp: 19 18  18  Temp: (!) 97.5 F (36.4 C) 98.6 F (37 C)  98.4 F (36.9 C)  TempSrc:  Oral  Oral  SpO2: 99% 98%  99%  Weight:   82.3 kg   Height:          Data Reviewed:  Basic Metabolic Panel: Recent Labs  Lab 02/22/22 1706 02/22/22 1921 02/23/22 0601 02/24/22 0311 02/25/22 0826 02/26/22 0331  NA 128*  --  137 137 138 138  K 4.2  --  3.7  3.5 3.5 3.6  CL 96*  --  103 102 99 98  CO2 17*  --  '25 25 30 29  '$ GLUCOSE 585*  --  185* 211* 119* 87  BUN 12  --  '13 15 16 17  '$ CREATININE 1.02*  --  0.74 0.79 0.90 0.86  CALCIUM 8.8*  --  8.9 8.9 9.0 9.1  MG  --  1.7 2.3  --   --   --   PHOS  --   --  4.7*  --   --   --     CBC: Recent Labs  Lab 02/22/22 1739 02/23/22 0601  WBC 10.9* 9.6  NEUTROABS 7.7 5.1  HGB 11.4* 11.8*  HCT 35.6* 36.5  MCV 76.4* 76.7*  PLT 313 317    LFT Recent Labs  Lab 02/22/22 1921 02/23/22 0601  AST 33 26  ALT 29 26  ALKPHOS 122 116  BILITOT 2.4* 1.7*  PROT 7.8 7.3  ALBUMIN 3.2* 3.1*     Antibiotics: Anti-infectives (From admission, onward)    None        DVT prophylaxis: Xarelto  Code Status: Full code  Family Communication: No family at bedside   CONSULTS cardiology   Objective    Physical Examination:  General-appears in no acute distress Heart-S1-S2, regular, no murmur auscultated Lungs-clear to auscultation bilaterally, no wheezing or crackles auscultated Abdomen-soft, mild tenderness in epigastric region,  no organomegaly Extremities-no edema in the lower extremities Neuro-alert, oriented x3, no focal deficit noted  Status is: Inpatient: CHF exacerbation         [place]   [facility] If 7PM-7AM, please contact night-coverage at [email or web address], Office  [phone]   02/26/2022, 10:55 AM  LOS: 4 days

## 2022-02-26 NOTE — Progress Notes (Signed)
Progress Note  Patient Name: [name] Date of Encounter: 02/26/2022  Primary Cardiologist:   Loralie Champagne, MD   Subjective   Still not breathing at baseline.  She says she has not been out of bed.    Inpatient Medications    Scheduled Meds:  carvedilol  3.125 mg Oral BID WC   digoxin  0.125 mg Oral Daily   empagliflozin  10 mg Oral Daily   furosemide  40 mg Intravenous BID   insulin aspart  0-15 Units Subcutaneous TID WC   insulin aspart  0-5 Units Subcutaneous QHS   insulin glargine-yfgn  30 Units Subcutaneous Q breakfast   losartan  25 mg Oral Daily   pantoprazole  40 mg Oral Q1200   Rivaroxaban  15 mg Oral BID WC   Followed by   Derrill Memo ON 03/16/2022] rivaroxaban  20 mg Oral Q supper   venlafaxine XR  37.5 mg Oral Q breakfast   Continuous Infusions:  PRN Meds: acetaminophen **OR** acetaminophen, morphine injection, naLOXone (NARCAN)  injection, ondansetron (ZOFRAN) IV   Vital Signs    Vitals:   02/25/22 1650 02/25/22 2107 02/26/22 0500 02/26/22 0606  BP: 130/89 (!) 118/92  (!) 127/100  Pulse: 84 80  83  Resp: '19 18  18  '$ Temp: (!) 97.5 F (36.4 C) 98.6 F (37 C)  98.4 F (36.9 C)  TempSrc:  Oral  Oral  SpO2: 99% 98%  99%  Weight:   82.3 kg   Height:        Intake/Output Summary (Last 24 hours) at 02/26/2022 0742 Last data filed at 02/26/2022 0500 Gross per 24 hour  Intake 118 ml  Output 2700 ml  Net -2582 ml   Filed Weights   02/22/22 1702 02/26/22 0500  Weight: 86.2 kg 82.3 kg    Telemetry    NSR - Personally Reviewed  ECG    NA - Personally Reviewed  Physical Exam   GEN: No acute distress.   Neck: No  JVD Cardiac: RRR, no murmurs, rubs, or gallops.  Respiratory: Clear  to auscultation bilaterally. GI: Soft, nontender, non-distended  MS: No  edema; No deformity. Neuro:  Nonfocal  Psych: Normal affect   Labs    Chemistry Recent Labs  Lab 02/22/22 1921 02/23/22 0601 02/24/22 0311 02/25/22 0826 02/26/22 0331  NA  --  137  137 138 138  K  --  3.7 3.5 3.5 3.6  CL  --  103 102 99 98  CO2  --  '25 25 30 29  '$ GLUCOSE  --  185* 211* 119* 87  BUN  --  '13 15 16 17  '$ CREATININE  --  0.74 0.79 0.90 0.86  CALCIUM  --  8.9 8.9 9.0 9.1  PROT 7.8 7.3  --   --   --   ALBUMIN 3.2* 3.1*  --   --   --   AST 33 26  --   --   --   ALT 29 26  --   --   --   ALKPHOS 122 116  --   --   --   BILITOT 2.4* 1.7*  --   --   --   GFRNONAA  --  >60 >60 >60 >60  ANIONGAP  --  '9 10 9 11     '$ Hematology Recent Labs  Lab 02/22/22 1739 02/23/22 0601  WBC 10.9* 9.6  RBC 4.66 4.76  HGB 11.4* 11.8*  HCT 35.6* 36.5  MCV 76.4* 76.7*  MCH 24.5* 24.8*  MCHC 32.0 32.3  RDW 14.7 14.6  PLT 313 317    Cardiac EnzymesNo results for input(s): "TROPONINI" in the last 168 hours. No results for input(s): "TROPIPOC" in the last 168 hours.   BNP Recent Labs  Lab 02/22/22 1706  BNP 443.3*     DDimer No results for input(s): "DDIMER" in the last 168 hours.   Radiology    No results found.  Cardiac Studies   Echo  02/23/22  1. 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 Left  ventricular ejection fraction, by estimation, is <20%. The left ventricle  has severely decreased function. The left ventricle demonstrates global  hypokinesis. The left ventricular  internal cavity size was mildly dilated. Left ventricular diastolic  parameters are consistent with Grade III diastolic dysfunction  (restrictive).   2. Right ventricular systolic function is moderately reduced. The right  ventricular size is normal. There is mildly elevated pulmonary artery  systolic pressure. The estimated right ventricular systolic pressure is  53.7 mmHg.   3. Left atrial size was mildly dilated.   4. The mitral valve is normal in structure. Mild mitral valve  regurgitation. No evidence of mitral stenosis.   5. Tricuspid valve regurgitation is mild to moderate.   6. The aortic valve is tricuspid. Aortic valve regurgitation is not   visualized. No aortic stenosis is present.   7. The inferior vena cava is normal in size with <50% respiratory  variability, suggesting right atrial pressure of 8 mmHg.   Patient Profile     46 y.o. 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 Darrick Meigs  Assessment & Plan    Acute on Chronic Combined Systolic and Diastolic Heart Failure Nonischemic cardiomyopathy :     EF less than 20%.    Med titration limited by low BP.   Net negative 8.2 liters.  OK to change to PO Lasix today.  Continue other meds as ordered.   LV thrombus :  Large LV thrombus but failed warfarin in the past because of compliance.  Now on Xarelto.    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  No change in therapy.    Paroxymal Atrial Fibrillation :  Continue Xarelto.  Maintaining NSR. On low dose beta blocker.   For questions or updates, please contact [place] Please consult [email or web address] for contact info under Cardiology/STEMI.   Signed, Minus Breeding, MD  02/26/2022, 7:42 AM

## 2022-02-27 DIAGNOSIS — D638 Anemia in other chronic diseases classified elsewhere: Secondary | ICD-10-CM | POA: Diagnosis not present

## 2022-02-27 DIAGNOSIS — I5023 Acute on chronic systolic (congestive) heart failure: Secondary | ICD-10-CM | POA: Diagnosis not present

## 2022-02-27 DIAGNOSIS — R1084 Generalized abdominal pain: Secondary | ICD-10-CM | POA: Diagnosis not present

## 2022-02-27 DIAGNOSIS — I214 Non-ST elevation (NSTEMI) myocardial infarction: Secondary | ICD-10-CM | POA: Diagnosis not present

## 2022-02-27 DIAGNOSIS — E1165 Type 2 diabetes mellitus with hyperglycemia: Secondary | ICD-10-CM | POA: Diagnosis not present

## 2022-02-27 DIAGNOSIS — F339 Major depressive disorder, recurrent, unspecified: Secondary | ICD-10-CM | POA: Diagnosis not present

## 2022-02-27 DIAGNOSIS — I480 Paroxysmal atrial fibrillation: Secondary | ICD-10-CM | POA: Diagnosis not present

## 2022-02-27 LAB — BASIC METABOLIC PANEL
Anion gap: 9 (ref 5–15)
BUN: 17 mg/dL (ref 6–20)
CO2: 28 mmol/L (ref 22–32)
Calcium: 8.9 mg/dL (ref 8.9–10.3)
Chloride: 99 mmol/L (ref 98–111)
Creatinine, Ser: 0.99 mg/dL (ref 0.44–1.00)
GFR, Estimated: >60 mL/min (ref >60)
Glucose, Bld: 115 mg/dL — ABNORMAL HIGH (ref 70–99)
Potassium: 3.7 mmol/L (ref 3.5–5.1)
Sodium: 136 mmol/L (ref 135–145)

## 2022-02-27 LAB — CBC
HCT: 45.5 % (ref 36.0–46.0)
Hemoglobin: 13.6 g/dL (ref 12.0–15.0)
MCH: 24.2 pg — ABNORMAL LOW (ref 26.0–34.0)
MCHC: 29.9 g/dL — ABNORMAL LOW (ref 30.0–36.0)
MCV: 81 fL (ref 80.0–100.0)
Platelets: 374 10*3/uL (ref 150–400)
RBC: 5.62 MIL/uL — ABNORMAL HIGH (ref 3.87–5.11)
RDW: 15.6 % — ABNORMAL HIGH (ref 11.5–15.5)
WBC: 11.9 10*3/uL — ABNORMAL HIGH (ref 4.0–10.5)
nRBC: 0 % (ref 0.0–0.2)

## 2022-02-27 LAB — GLUCOSE, CAPILLARY
Glucose-Capillary: 129 mg/dL — ABNORMAL HIGH (ref 70–99)
Glucose-Capillary: 166 mg/dL — ABNORMAL HIGH (ref 70–99)
Glucose-Capillary: 82 mg/dL (ref 70–99)
Glucose-Capillary: 149 mg/dL — ABNORMAL HIGH (ref 70–99)

## 2022-02-27 MED ORDER — ALUM & MAG HYDROXIDE-SIMETH 200-200-20 MG/5ML PO SUSP
30.0000 mL | ORAL | Status: AC
  Administered 2022-02-27: 30 mL via ORAL
  Filled 2022-02-27 (×2): qty 30

## 2022-02-27 MED ORDER — LOSARTAN POTASSIUM 25 MG PO TABS
25.0000 mg | ORAL_TABLET | Freq: Every day | ORAL | 3 refills | Status: DC
  Filled 2022-02-27: qty 30, 30d supply, fill #0
  Filled 2022-04-25: qty 30, 30d supply, fill #1

## 2022-02-27 MED ORDER — PANTOPRAZOLE SODIUM 40 MG PO TBEC
40.0000 mg | DELAYED_RELEASE_TABLET | Freq: Every day | ORAL | 0 refills | Status: DC
  Filled 2022-02-27: qty 30, 30d supply, fill #0

## 2022-02-27 MED ORDER — FUROSEMIDE 80 MG PO TABS
80.0000 mg | ORAL_TABLET | Freq: Every morning | ORAL | 11 refills | Status: DC
  Filled 2022-02-27: qty 30, 30d supply, fill #0

## 2022-02-27 MED ORDER — ACETAMINOPHEN 325 MG PO TABS: 650.0000 mg | ORAL_TABLET | Freq: Four times a day (QID) | ORAL | Status: DC | PRN

## 2022-02-27 MED ORDER — FUROSEMIDE 40 MG PO TABS
60.0000 mg | ORAL_TABLET | Freq: Two times a day (BID) | ORAL | Status: DC
  Administered 2022-02-27 – 2022-02-28 (×3): 60 mg via ORAL
  Filled 2022-02-27 (×3): qty 1

## 2022-02-27 MED ORDER — PANTOPRAZOLE SODIUM 40 MG PO TBEC
40.0000 mg | DELAYED_RELEASE_TABLET | Freq: Two times a day (BID) | ORAL | Status: DC
  Administered 2022-02-27 – 2022-02-28 (×2): 40 mg via ORAL
  Filled 2022-02-27 (×2): qty 1

## 2022-02-27 MED ORDER — METOCLOPRAMIDE HCL 5 MG/ML IJ SOLN
10.0000 mg | Freq: Four times a day (QID) | INTRAMUSCULAR | Status: AC
  Administered 2022-02-27 – 2022-02-28 (×4): 10 mg via INTRAVENOUS
  Filled 2022-02-27 (×4): qty 2

## 2022-02-27 MED ORDER — FUROSEMIDE 40 MG PO TABS
40.0000 mg | ORAL_TABLET | Freq: Every evening | ORAL | 1 refills | Status: DC
  Filled 2022-02-27: qty 30, 30d supply, fill #0

## 2022-02-27 MED ORDER — CARVEDILOL 3.125 MG PO TABS
3.1250 mg | ORAL_TABLET | Freq: Two times a day (BID) | ORAL | 2 refills | Status: DC
  Filled 2022-02-27: qty 60, 30d supply, fill #0
  Filled 2022-04-25: qty 60, 30d supply, fill #1

## 2022-02-27 NOTE — Discharge Summary (Addendum)
Physician Discharge Summary   Patient: [name] MRN: [id number] DOB: July 01, 1976  Admit date:     02/22/2022  Discharge date: 02/28/22  Discharge Physician: Oswald Hillock   PCP: Darci Current, DO   Recommendations at discharge:   Follow-up PCP in 1 week Follow-up heart failure clinic as outpatient  Discharge Diagnoses: Principal Problem:   Acute on chronic systolic heart failure (HCC) Active Problems:   Poorly controlled type 2 diabetes mellitus (HCC)   Anemia of chronic disease   Paroxysmal atrial fibrillation (HCC)   Hyperglycemia   Abdominal pain   Depression, recurrent (HCC)   NSTEMI (non-ST elevated myocardial infarction) ([place])  Resolved Problems:   * No resolved hospital problems. *  Hospital Course:  46 year old female with medical history of chronic systolic/diastolic heart failure with LVEF 20 to 25%, felt to be nonischemic cardiomyopathy, paroxysmal atrial fibrillation on Xarelto, diabetes mellitus type 2, anemia of chronic disease, baseline hemoglobin 11-13, essential hypertension who was admitted to [facility] on 02/22/2022 with acute on chronic systolic/diastolic heart failure, after she presented with shortness of breath. Patient was given IV Lasix, troponin elevation 161, 155 Cardiology consulted for combined systolic and diastolic CHF exacerbation as well as suspected type II NSTEMI   Assessment and Plan:  Acute on chronic systolic/diastolic heart failure -Most recent echocardiogram in April 2023 showed EF of 20 to 25% with grade 3 diastolic dysfunction -Felt to be nonischemic cardiomyopathy per cardiology -Cardiology was consulted -Patient started on Lasix 80 mg IV twice daily; changed to 40 mg IV twice daily per cardiology -IV Lasix has been changed to p.o. Lasix 40 mg p.o. twice daily as per cardiology -Continue Coreg 3.125 mg twice daily -Continue Jardiance -Patient ready for discharge, cardiology recommends to send home on Lasix 80 mg every  morning and 40 mg every evening.   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 -Will discharge on Zofran 4 mg every 8 hours as needed for nausea and vomiting   Anemia of chronic disease -Hemoglobin is stable at 11.8   Depression -Continue Wellbutrin         Consultants: Cardiology Procedures performed: none  Disposition: Home Diet recommendation:  Discharge Diet Orders (From admission, onward)     Start     Ordered   02/27/22 0000  Diet - low sodium heart healthy        02/27/22 1120  Cardiac diet DISCHARGE MEDICATION: Allergies as of 02/28/2022       Reactions   Shellfish Allergy Swelling   Airway involvement including EMS - Has Epi-Pen   Metformin And Related Diarrhea   Trulicity [dulaglutide] Nausea And  Vomiting, Other (See Comments)   Multiple episodes of vomiting over multiple days   Ibuprofen Other (See Comments)   Per PCP interferes with daily meds (heart failure)        Medication List     STOP taking these medications    naproxen 500 MG tablet Commonly known as: Naprosyn   torsemide 20 MG tablet Commonly known as: DEMADEX       TAKE these medications    Accu-Chek Aviva Plus w/Device Kit 1 application by Does not apply route QID. Check fasting sugars as well as throughout the day   Accu-Chek Softclix Lancet Dev Kit 1 application by Does not apply route 2 (two) times daily.   Accu-Chek Softclix Lancets lancets Use as instructed   acetaminophen 325 MG tablet Commonly known as: TYLENOL Take 2 tablets (650 mg total) by mouth every 6 (six) hours as needed for mild pain (or Fever >/= 101).   albuterol 108 (90 Base) MCG/ACT inhaler Commonly known as: VENTOLIN HFA Inhale 2 puffs into the lungs every 6 (six) hours as needed for wheezing or shortness of breath.   atorvastatin 40 MG tablet Commonly known as: LIPITOR TAKE 1 TABLET BY MOUTH DAILY AT 6 PM. Notes to patient: 02/27/2022 supper    blood glucose meter kit and supplies Kit Dispense based on patient and insurance preference. Use up to four times daily as directed. (FOR ICD-9 250.00, 250.01).   carvedilol 3.125 MG tablet Commonly known as: COREG Take 1 tablet (3.125 mg total) by mouth 2 (two) times daily with a meal. What changed:  medication strength how much to take Notes to patient: 02/27/2022 evening meal    digoxin 0.125 MG tablet Commonly known as: LANOXIN Take 1 tablet (0.125 mg total) by mouth daily. Notes to patient: 02/28/2022    empagliflozin 10 MG Tabs tablet Commonly known as: JARDIANCE Take 1 tablet (10 mg total) by mouth daily. Notes to patient: 02/28/2022    furosemide 40 MG tablet Commonly known as: Lasix Take 1 tablet (40 mg total) by mouth every evening.   furosemide 80 MG  tablet Commonly known as: Lasix Take 1 tablet (80 mg total) by mouth every morning.   glucose blood test strip Commonly known as: AgaMatrix Presto Test Use as instructed   Accu-Chek Aviva Plus test strip Generic drug: glucose blood Check CBG 2 times per day   hydrOXYzine 10 MG tablet Commonly known as: ATARAX Take 1 tablet (10 mg total) by mouth 3 (three) times daily as needed. What changed: reasons to take this   INSULIN SYRINGE .3CC/31GX5/16" 31G X 5/16" 0.3 ML Misc 15 Units by Does not apply route 2 (two) times daily.   Lantus SoloStar 100 UNIT/ML Solostar Pen Generic drug: insulin glargine Inject 36 Units into the skin every morning. Notes to patient: 02/28/2022    losartan 25 MG tablet Commonly known as: COZAAR Take 1 tablet (25 mg total) by mouth daily. Notes to patient: 02/28/2022    metoCLOPramide 5 MG tablet Commonly known as: Reglan Take 1 tablet (5 mg total) by mouth every 8 (eight) hours as needed for nausea.   NovoLOG FlexPen 100 UNIT/ML FlexPen Generic drug: insulin aspart Inject 12 Units into the skin daily before supper. Notes to patient: 02/27/2022 supper  time    ondansetron 4 MG tablet Commonly known as: Zofran Take 1 tablet (4 mg total) by mouth every 8 (eight) hours as needed for nausea or vomiting.   pantoprazole 40 MG tablet Commonly known as: PROTONIX Take 1 tablet (40 mg total) by mouth daily. Notes to patient: 02/28/2022    rivaroxaban 20 MG Tabs tablet Commonly known as: XARELTO Take 1 tablet (20 mg total) by mouth daily with supper.   spironolactone 25 MG tablet Commonly known as: ALDACTONE Take 0.5 tablets (12.5 mg total) by mouth daily. Notes to patient: 02/28/2022    Unifine Pentips 31G X 5 MM Misc Generic drug: Insulin Pen Needle USE AS DIRECTED 2 TIMES DAILY   venlafaxine XR 37.5 MG 24 hr capsule Commonly known as: Effexor XR Take 1 capsule (37.5 mg total) by mouth daily with breakfast. Notes to patient: 02/28/2022          Follow-up Information     Darci Current, DO Follow up in 1 week(s).   Specialty: Family Medicine Contact information: [place] [address] [phone]         Larey Dresser, MD .   Specialty: Cardiology Contact information: [phone] [place] [address] [phone]         Constance Haw, MD .   Specialty: Cardiology Contact information: [place] [address] [phone]                Discharge Exam: Danley Danker Weights   02/26/22 0500 02/27/22 0333 02/28/22 0500  Weight: 82.3 kg 80.7 kg 80.7 kg   General-appears in no acute distress Heart-S1-S2, regular, no murmur auscultated Lungs-clear to auscultation bilaterally, no wheezing or crackles auscultated Abdomen-soft, nontender, no organomegaly Extremities-no edema in the lower extremities Neuro-alert, oriented x3, no focal deficit noted  Condition at discharge: good  The results of significant diagnostics from this hospitalization (including imaging, microbiology, ancillary and laboratory) are listed below for reference.   Imaging Studies: ECHOCARDIOGRAM COMPLETE  Result Date: 02/23/2022    ECHOCARDIOGRAM REPORT   Patient Name:   [name] Date of Exam: 02/23/2022 Medical Rec #:  [id number]       Height:       59.0 in Accession #:    [id number]      Weight:       190.0 lb Date of Birth:  Jan 11, 1976        BSA:          1.805 m Patient Age:    65 years        BP:           142/101 mmHg Patient Gender: F               HR:           87 bpm. Exam Location:  Inpatient Procedure: 2D Echo, Color Doppler, Cardiac Doppler and Intracardiac            Opacification Agent STAT ECHO Indications:    NSTEMI I21.4  History:        Patient has prior history of Echocardiogram examinations, most                 recent 11/02/2021. CHF, Arrythmias:Atrial Fibrillation,                 Signs/Symptoms:Shortness of Breath; Risk Factors:Hypertension  and Diabetes. Nonischemic cardiomyopathy.  Sonographer:    Darlina Sicilian RDCS Referring Phys: [id number] [place]  1. 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 Left ventricular ejection fraction, by estimation, is <20%. The left ventricle has severely decreased function. The left ventricle demonstrates global hypokinesis. The left ventricular internal cavity size was mildly dilated. Left ventricular diastolic parameters are consistent with Grade III diastolic dysfunction (restrictive).  2. Right ventricular systolic function is moderately reduced. The right ventricular size is normal. There is mildly elevated pulmonary artery systolic pressure. The estimated right ventricular systolic pressure is 32.4 mmHg.  3. Left atrial size was mildly dilated.  4. The mitral valve is normal in structure. Mild mitral valve regurgitation. No evidence of mitral stenosis.  5. Tricuspid valve regurgitation is mild to moderate.  6. The aortic valve is tricuspid. Aortic valve regurgitation is not visualized. No aortic stenosis is present.  7. The inferior vena cava is normal in size with <50% respiratory variability, suggesting right atrial pressure of 8 mmHg. 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 Left ventricular ejection fraction, by estimation, is <20%. The left ventricle has severely decreased function. The left ventricle demonstrates global hypokinesis. Definity contrast agent was given IV to delineate the left ventricular endocardial borders. The left ventricular internal cavity size was mildly dilated. There is no left ventricular hypertrophy. Left ventricular diastolic parameters are consistent with Grade III  diastolic dysfunction (restrictive). Right Ventricle: The right ventricular size is normal. No increase in right ventricular wall thickness. Right ventricular systolic function is moderately reduced. There is mildly elevated pulmonary  artery systolic pressure. The tricuspid regurgitant velocity is 3.01 m/s, and with an assumed right atrial pressure of 8 mmHg, the estimated right ventricular systolic pressure is 40.1 mmHg. Left Atrium: Left atrial size was mildly dilated. Right Atrium: Right atrial size was normal in size. Pericardium: There is no evidence of pericardial effusion. Mitral Valve: The mitral valve is normal in structure. Mild mitral valve regurgitation. No evidence of mitral valve stenosis. Tricuspid Valve: The tricuspid valve is normal in structure. Tricuspid valve regurgitation is mild to moderate. Aortic Valve: The aortic valve is tricuspid. Aortic valve regurgitation is not visualized. No aortic stenosis is present. Pulmonic Valve: The pulmonic valve was normal in structure. Pulmonic valve regurgitation is trivial. Aorta: The aortic root is normal in size and structure. Venous: The inferior vena cava is normal in size with less than 50% respiratory variability, suggesting right atrial pressure of 8 mmHg. IAS/Shunts: No atrial level shunt detected by color flow Doppler.   LV Volumes (MOD) LV vol d, MOD A2C: 172.0 ml Diastology LV vol d, MOD A4C: 158.0 ml LV e' medial:    2.95 cm/s LV vol s, MOD A2C: 152.0 ml LV E/e' medial:  25.7 LV vol s, MOD A4C: 142.0 ml LV e' lateral:   3.09 cm/s LV SV MOD A2C:     20.0 ml  LV E/e' lateral: 24.5 LV SV MOD A4C:     158.0 ml LV SV MOD BP:      16.7 ml RIGHT VENTRICLE RV S prime:     6.66 cm/s TAPSE (M-mode): 1.0 cm LEFT ATRIUM             Index        RIGHT ATRIUM           Index LA Vol (A2C):   55.5 ml 30.76 ml/m  RA Area:     14.80 cm LA Vol (A4C):   63.1 ml 34.97 ml/m  RA Volume:   35.40 ml  19.62 ml/m LA Biplane Vol: 62.1 ml 34.41 ml/m  AORTIC VALVE             PULMONIC VALVE LVOT Vmax:   61.40 cm/s  RVOT Peak grad: 1 mmHg LVOT Vmean:  39.700 cm/s LVOT VTI:    0.072 m  AORTA Ao Asc diam: 2.80 cm MITRAL VALVE               TRICUSPID VALVE MV Area (PHT): 6.90 cm    TR Peak grad:   36.2  mmHg MV Decel Time: 110 msec    TR Mean grad:   21.0 mmHg MV E velocity: 75.85 cm/s  TR Vmax:        301.00 cm/s                            TR Vmean:       209.0 cm/s                             SHUNTS                            Systemic VTI: 0.07 m                            Pulmonic VTI: 0.052 m Dalton McleanMD Electronically signed by Franki Monte Signature Date/Time: 02/23/2022/12:50:54 PM    Final    CT Angio Chest PE W and/or Wo Contrast  Result Date: 02/22/2022 CLINICAL DATA:  Abdomen pain chest pain nausea vomiting short of breath EXAM: CT ANGIOGRAPHY CHEST CT ABDOMEN AND PELVIS WITH CONTRAST TECHNIQUE: Multidetector CT imaging of the chest was performed using the standard protocol during bolus administration of intravenous contrast. Multiplanar CT image reconstructions and MIPs were obtained to evaluate the vascular anatomy. Multidetector CT imaging of the abdomen and pelvis was performed using the standard protocol during bolus administration of intravenous contrast. RADIATION DOSE REDUCTION: This exam was performed according to the departmental dose-optimization program which includes automated exposure control, adjustment of the mA and/or kV according to patient size and/or use of iterative reconstruction technique. CONTRAST:  111m OMNIPAQUE IOHEXOL 350 MG/ML SOLN COMPARISON:  Chest x-ray 02/22/2022, CT abdomen pelvis 11/02/2021, CT chest 09/17/2021 FINDINGS: CTA CHEST FINDINGS Cardiovascular: Satisfactory opacification of the pulmonary arteries to the segmental level. No evidence of pulmonary embolism. Cardiomegaly. No pericardial effusion. Nonaneurysmal aorta. On the CT abdomen pelvis images, there is an oval hypodensity near the left ventricular apex. Mediastinum/Nodes: No enlarged mediastinal, hilar, or axillary lymph nodes. Thyroid gland, trachea, and esophagus demonstrate no significant findings. Lungs/Pleura: Lungs are clear. No pleural effusion or pneumothorax. Musculoskeletal: No chest wall  abnormality. No acute or significant osseous findings. Review of the MIP images confirms the above findings. CT ABDOMEN and PELVIS FINDINGS Hepatobiliary: Heterogenous liver attenuation. No focal hepatic abnormality. No calcified gallstone or biliary dilatation. Pancreas: Unremarkable. No pancreatic ductal dilatation or surrounding inflammatory changes. Spleen: Normal in size without focal abnormality. Adrenals/Urinary Tract: Adrenal glands are normal. Kidneys show no hydronephrosis. Cyst in the right kidney. Subcentimeter hypodensities in the left kidney too small to further characterize. The bladder is normal Stomach/Bowel: The stomach is nonenlarged. No dilated small bowel. Negative  appendix. No acute bowel wall thickening Vascular/Lymphatic: Mild aortic atherosclerosis. No aneurysm. No suspicious lymph nodes. Reproductive: Prominent uterine fundus. No suspicious adnexal mass. Other: Negative for pelvic effusion or free air. Mild generalized subcutaneous edema. Musculoskeletal: No acute osseous abnormality. Review of the MIP images confirms the above findings. IMPRESSION: 1. Negative for acute pulmonary embolus. Ovoid hypodensity/filling defect within the left ventricular apex seen best on the abdominal images, recommend correlation with echocardiogram to exclude small volume thrombus or mass. 2. Cardiomegaly with reflux of contrast into the hepatic veins consistent with elevated right heart pressures. 3. Heterogenous enhancement of the liver likely due to passive congestion. 4. Mild generalized subcutaneous edema. Electronically Signed   By: Donavan Foil M.D.   On: 02/22/2022 20:34   CT ABDOMEN PELVIS W CONTRAST  Result Date: 02/22/2022 CLINICAL DATA:  Abdomen pain chest pain nausea vomiting short of breath EXAM: CT ANGIOGRAPHY CHEST CT ABDOMEN AND PELVIS WITH CONTRAST TECHNIQUE: Multidetector CT imaging of the chest was performed using the standard protocol during bolus administration of intravenous  contrast. Multiplanar CT image reconstructions and MIPs were obtained to evaluate the vascular anatomy. Multidetector CT imaging of the abdomen and pelvis was performed using the standard protocol during bolus administration of intravenous contrast. RADIATION DOSE REDUCTION: This exam was performed according to the departmental dose-optimization program which includes automated exposure control, adjustment of the mA and/or kV according to patient size and/or use of iterative reconstruction technique. CONTRAST:  163m OMNIPAQUE IOHEXOL 350 MG/ML SOLN COMPARISON:  Chest x-ray 02/22/2022, CT abdomen pelvis 11/02/2021, CT chest 09/17/2021 FINDINGS: CTA CHEST FINDINGS Cardiovascular: Satisfactory opacification of the pulmonary arteries to the segmental level. No evidence of pulmonary embolism. Cardiomegaly. No pericardial effusion. Nonaneurysmal aorta. On the CT abdomen pelvis images, there is an oval hypodensity near the left ventricular apex. Mediastinum/Nodes: No enlarged mediastinal, hilar, or axillary lymph nodes. Thyroid gland, trachea, and esophagus demonstrate no significant findings. Lungs/Pleura: Lungs are clear. No pleural effusion or pneumothorax. Musculoskeletal: No chest wall abnormality. No acute or significant osseous findings. Review of the MIP images confirms the above findings. CT ABDOMEN and PELVIS FINDINGS Hepatobiliary: Heterogenous liver attenuation. No focal hepatic abnormality. No calcified gallstone or biliary dilatation. Pancreas: Unremarkable. No pancreatic ductal dilatation or surrounding inflammatory changes. Spleen: Normal in size without focal abnormality. Adrenals/Urinary Tract: Adrenal glands are normal. Kidneys show no hydronephrosis. Cyst in the right kidney. Subcentimeter hypodensities in the left kidney too small to further characterize. The bladder is normal Stomach/Bowel: The stomach is nonenlarged. No dilated small bowel. Negative appendix. No acute bowel wall thickening  Vascular/Lymphatic: Mild aortic atherosclerosis. No aneurysm. No suspicious lymph nodes. Reproductive: Prominent uterine fundus. No suspicious adnexal mass. Other: Negative for pelvic effusion or free air. Mild generalized subcutaneous edema. Musculoskeletal: No acute osseous abnormality. Review of the MIP images confirms the above findings. IMPRESSION: 1. Negative for acute pulmonary embolus. Ovoid hypodensity/filling defect within the left ventricular apex seen best on the abdominal images, recommend correlation with echocardiogram to exclude small volume thrombus or mass. 2. Cardiomegaly with reflux of contrast into the hepatic veins consistent with elevated right heart pressures. 3. Heterogenous enhancement of the liver likely due to passive congestion. 4. Mild generalized subcutaneous edema. Electronically Signed   By: KDonavan FoilM.D.   On: 02/22/2022 20:34   DG Chest 2 View  Result Date: 02/22/2022 CLINICAL DATA:  Shortness of breath EXAM: CHEST - 2 VIEW COMPARISON:  01/28/2022 FINDINGS: Unchanged cardiomegaly. Mild bibasilar atelectasis, similar to the prior. No new focal pulmonary  opacity. No pleural effusion or pneumothorax. No acute osseous abnormality. IMPRESSION: Cardiomegaly without additional acute cardiopulmonary process. Electronically Signed   By: Merilyn Baba M.D.   On: 02/22/2022 18:06    Microbiology: Results for orders placed or performed during the hospital encounter of 10/17/21  Resp Panel by RT-PCR (Flu A&B, Covid) Nasopharyngeal Swab     Status: None   Collection Time: 10/17/21  9:24 PM   Specimen: Nasopharyngeal Swab; Nasopharyngeal(NP) swabs in vial transport medium  Result Value Ref Range Status   SARS Coronavirus 2 by RT PCR NEGATIVE NEGATIVE Final    Comment: (NOTE) SARS-CoV-2 target nucleic acids are NOT DETECTED.  The SARS-CoV-2 RNA is generally detectable in upper respiratory specimens during the acute phase of infection. The lowest concentration of SARS-CoV-2  viral copies this assay can detect is 138 copies/mL. A negative result does not preclude SARS-Cov-2 infection and should not be used as the sole basis for treatment or other patient management decisions. A negative result may occur with  improper specimen collection/handling, submission of specimen other than nasopharyngeal swab, presence of viral mutation(s) within the areas targeted by this assay, and inadequate number of viral copies(<138 copies/mL). A negative result must be combined with clinical observations, patient history, and epidemiological information. The expected result is Negative.  Fact Sheet for Patients:  EntrepreneurPulse.com.au  Fact Sheet for Healthcare Providers:  IncredibleEmployment.be  This test is no t yet approved or cleared by the [place] FDA and  has been authorized for detection and/or diagnosis of SARS-CoV-2 by FDA under an Emergency Use Authorization (EUA). This EUA will remain  in effect (meaning this test can be used) for the duration of the COVID-19 declaration under Section 564(b)(1) of the Act, 21 U.S.C.section 360bbb-3(b)(1), unless the authorization is terminated  or revoked sooner.       Influenza A by PCR NEGATIVE NEGATIVE Final   Influenza B by PCR NEGATIVE NEGATIVE Final    Comment: (NOTE) The Xpert Xpress SARS-CoV-2/FLU/RSV plus assay is intended as an aid in the diagnosis of influenza from Nasopharyngeal swab specimens and should not be used as a sole basis for treatment. Nasal washings and aspirates are unacceptable for Xpert Xpress SARS-CoV-2/FLU/RSV testing.  Fact Sheet for Patients: EntrepreneurPulse.com.au  Fact Sheet for Healthcare Providers: IncredibleEmployment.be  This test is not yet approved or cleared by the [place] FDA and has been authorized for detection and/or diagnosis of SARS-CoV-2 by FDA under an Emergency Use Authorization  (EUA). This EUA will remain in effect (meaning this test can be used) for the duration of the COVID-19 declaration under Section 564(b)(1) of the Act, 21 U.S.C. section 360bbb-3(b)(1), unless the authorization is terminated or revoked.  Performed at [facility], [place] [address] [place], [place] [address]     Labs: CBC: Recent Labs  Lab 02/22/22 1739 02/23/22 0601 02/27/22 0350  WBC 10.9* 9.6 11.9*  NEUTROABS 7.7 5.1  --   HGB 11.4* 11.8* 13.6  HCT 35.6* 36.5 45.5  MCV 76.4* 76.7* 81.0  PLT 313 317 680   Basic Metabolic Panel: Recent Labs  Lab 02/22/22 1921 02/23/22 0601 02/24/22 0311 02/25/22 0826 02/26/22 0331 02/27/22 0350 02/28/22 0253  NA  --  137 137 138 138 136 135  K  --  3.7 3.5 3.5 3.6 3.7 4.0  CL  --  103 102 99 98 99 100  CO2  --  '25 25 30 29 28 25  ' GLUCOSE  --  185* 211* 119* 87 115* 115*  BUN  --  '13 15 16 17 17 13  ' CREATININE  --  0.74 0.79 0.90 0.86 0.99 1.01*  CALCIUM  --  8.9 8.9 9.0 9.1 8.9 8.8*  MG 1.7 2.3  --   --   --   --   --   PHOS  --  4.7*  --   --   --   --   --    Liver Function Tests: Recent Labs  Lab 02/22/22 1921 02/23/22 0601  AST 33 26  ALT 29 26  ALKPHOS 122 116  BILITOT 2.4* 1.7*  PROT 7.8 7.3  ALBUMIN 3.2* 3.1*   CBG: Recent Labs  Lab 02/27/22 0813 02/27/22 1204 02/27/22 1559 02/27/22 2121 02/28/22 0725  GLUCAP 129* 166* 82 149* 145*    Discharge time spent: greater than 30 minutes.  Signed: Oswald Hillock, MD [facility] 02/28/2022

## 2022-02-27 NOTE — Progress Notes (Addendum)
[facility]  PROGRESS NOTE  [name] CBJ:[id number] DOB: 01/03/1976 DOA: 02/22/2022 PCP: Darci Current, DO   Brief HPI:   46 year old female with medical history of chronic systolic/diastolic heart failure with LVEF 20 to 25%, felt to be nonischemic cardiomyopathy, paroxysmal atrial fibrillation on Xarelto, diabetes mellitus type 2, anemia of chronic disease, baseline hemoglobin 11-13, essential hypertension who was admitted to [facility] on 02/22/2022 with acute on chronic systolic/diastolic heart failure, after she presented with shortness of breath. Patient was given IV Lasix, troponin elevation 161, 155 Cardiology consulted for combined systolic and diastolic CHF exacerbation as well as suspected type II NSTEMI    Subjective   Patient seen and examined, continues to have abdominal pain.  Had 1 episode of vomiting this morning.  Plan was to discharge home as her breathing has significantly improved.  However due to abdominal pain and vomiting, discharge was held.   Assessment/Plan:   Acute on chronic systolic/diastolic heart failure -Most recent echocardiogram in April 2023 showed EF of 20 to 25% with grade 3 diastolic dysfunction -Felt to be nonischemic cardiomyopathy per cardiology -Cardiology was consulted -Patient started on Lasix 80 mg IV twice daily; changed to 40 mg IV twice daily per cardiology -IV Lasix has been changed to p.o. Lasix 60 mg twice a day -Continue Coreg 3.125 mg twice daily -Continue Jardiance  LV thrombus -Echocardiogram obtained today shows LV thrombus at apex 2.2 x 1.4 cm with some mobility -Patient was initially started on IV heparin -IV heparin was switched to Xarelto as per cardiology recommendation -Cardiology follow-up as outpatient to consider Coumadin as outpatient  Paroxysmal atrial fibrillation -Patient in sinus rhythm -Continue Coreg, digoxin 0.125 mg daily -IV heparin has been switched to Xarelto as above  Uncontrolled  diabetes mellitus type 2 -Hemoglobin A1c elevated at 12.1 -Started on sliding scale insulin, moderate -CBG well controlled  ?  NSTEMI -Patient has elevated troponin 161, 155 -EKG showed sinus tachycardia without acute ischemic changes -CT chest showed no evidence of PE -Initially started on heparin -Cardiology consulted, did not feel it was a NSTEMI -Troponin elevation likely in the setting of CHF exacerbation -IV heparin has been discontinued and patient started on Xarelto as above  Abdominal pain -Likely in the setting of congestive hepatopathy versus gastritis versus gastroparesis -CT abdomen shows massive hepatic congestion in setting of acute on chronic systolic/diastolic heart failure -Continue diuresis as above -Also started on Protonix for possible GERD. -Will increase Protonix to 40 mg p.o. twice daily -We will start scheduled Maalox 15 mL every 4 hours x4 doses -We will start Reglan 10 mg IV every 6 hours x4 doses  Anemia of chronic disease -Hemoglobin is stable at 11.8 -Repeat hemoglobin is 13.6 today  Depression -Continue Wellbutrin  Medications     alum & mag hydroxide-simeth  30 mL Oral Q4H   carvedilol  3.125 mg Oral BID WC   digoxin  0.125 mg Oral Daily   empagliflozin  10 mg Oral Daily   furosemide  60 mg Oral BID   insulin aspart  0-15 Units Subcutaneous TID WC   insulin aspart  0-5 Units Subcutaneous QHS   insulin glargine-yfgn  30 Units Subcutaneous Q breakfast   losartan  25 mg Oral Daily   pantoprazole  40 mg Oral BID   Rivaroxaban  15 mg Oral BID WC   Followed by   Derrill Memo ON 03/16/2022] rivaroxaban  20 mg Oral Q supper   venlafaxine XR  37.5 mg Oral Q  breakfast     Data Reviewed:   CBG:  Recent Labs  Lab 02/26/22 1307 02/26/22 1740 02/26/22 2129 02/27/22 0813 02/27/22 1204  GLUCAP 183* 85 241* 129* 166*    SpO2: 99 %    Vitals:   02/26/22 1413 02/26/22 2033 02/27/22 0331 02/27/22 0333  BP: (!) 130/91 (!) 136/97 (!) 132/99    Pulse: 84 84 81   Resp:  18 18   Temp: 97.8 F (36.6 C) 98.2 F (36.8 C) 98.6 F (37 C)   TempSrc: Oral Oral Oral   SpO2: 98% 99% 99%   Weight:    80.7 kg  Height:          Data Reviewed:  Basic Metabolic Panel: Recent Labs  Lab 02/22/22 1921 02/23/22 0601 02/24/22 0311 02/25/22 0826 02/26/22 0331 02/27/22 0350  NA  --  137 137 138 138 136  K  --  3.7 3.5 3.5 3.6 3.7  CL  --  103 102 99 98 99  CO2  --  '25 25 30 29 28  '$ GLUCOSE  --  185* 211* 119* 87 115*  BUN  --  '13 15 16 17 17  '$ CREATININE  --  0.74 0.79 0.90 0.86 0.99  CALCIUM  --  8.9 8.9 9.0 9.1 8.9  MG 1.7 2.3  --   --   --   --   PHOS  --  4.7*  --   --   --   --     CBC: Recent Labs  Lab 02/22/22 1739 02/23/22 0601  WBC 10.9* 9.6  NEUTROABS 7.7 5.1  HGB 11.4* 11.8*  HCT 35.6* 36.5  MCV 76.4* 76.7*  PLT 313 317    LFT Recent Labs  Lab 02/22/22 1921 02/23/22 0601  AST 33 26  ALT 29 26  ALKPHOS 122 116  BILITOT 2.4* 1.7*  PROT 7.8 7.3  ALBUMIN 3.2* 3.1*     Antibiotics: Anti-infectives (From admission, onward)    None        DVT prophylaxis: Xarelto  Code Status: Full code  Family Communication: No family at bedside   CONSULTS cardiology   Objective    Physical Examination:  General-appears in no acute distress Heart-S1-S2, regular, no murmur auscultated Lungs-clear to auscultation bilaterally, no wheezing or crackles auscultated Abdomen-soft, mild epigastric tenderness to palpation, no organomegaly Extremities-no edema in the lower extremities Neuro-alert, oriented x3, no focal deficit noted  Status is: Inpatient: CHF exacerbation         [place]   [facility] If 7PM-7AM, please contact night-coverage at [email or web address], Office  [phone]   02/27/2022, 1:11 PM  LOS: 5 days

## 2022-02-27 NOTE — Progress Notes (Signed)
Progress Note  Patient Name: [name] Date of Encounter: 02/27/2022  Primary Cardiologist:   Loralie Champagne, MD   Subjective   Reports that her breathing is back to baseline.  No pain.    Inpatient Medications    Scheduled Meds:  carvedilol  3.125 mg Oral BID WC   digoxin  0.125 mg Oral Daily   empagliflozin  10 mg Oral Daily   furosemide  40 mg Oral BID   insulin aspart  0-15 Units Subcutaneous TID WC   insulin aspart  0-5 Units Subcutaneous QHS   insulin glargine-yfgn  30 Units Subcutaneous Q breakfast   losartan  25 mg Oral Daily   pantoprazole  40 mg Oral Q1200   Rivaroxaban  15 mg Oral BID WC   Followed by   Derrill Memo ON 03/16/2022] rivaroxaban  20 mg Oral Q supper   venlafaxine XR  37.5 mg Oral Q breakfast   Continuous Infusions:  PRN Meds: acetaminophen **OR** acetaminophen, morphine injection, naLOXone (NARCAN)  injection, ondansetron (ZOFRAN) IV   Vital Signs    Vitals:   02/26/22 1413 02/26/22 2033 02/27/22 0331 02/27/22 0333  BP: (!) 130/91 (!) 136/97 (!) 132/99   Pulse: 84 84 81   Resp:  18 18   Temp: 97.8 F (36.6 C) 98.2 F (36.8 C) 98.6 F (37 C)   TempSrc: Oral Oral Oral   SpO2: 98% 99% 99%   Weight:    80.7 kg  Height:        Intake/Output Summary (Last 24 hours) at 02/27/2022 0728 Last data filed at 02/26/2022 1945 Gross per 24 hour  Intake 598 ml  Output 600 ml  Net -2 ml   Filed Weights   02/22/22 1702 02/26/22 0500 02/27/22 0333  Weight: 86.2 kg 82.3 kg 80.7 kg    Telemetry    NSR - Personally Reviewed  ECG    NA - Personally Reviewed  Physical Exam   GEN: No  acute distress.   Neck: No  JVD Cardiac: RRR, no murmurs, rubs, or gallops.  Respiratory: Clear   to auscultation bilaterally. GI: Soft, nontender, non-distended, normal bowel sounds  MS:  No edema; No deformity. Neuro:   Nonfocal  Psych: Oriented and appropriate    Labs    Chemistry Recent Labs  Lab 02/22/22 1921 02/23/22 0601 02/24/22 0311  02/25/22 0826 02/26/22 0331 02/27/22 0350  NA  --  137   < > 138 138 136  K  --  3.7   < > 3.5 3.6 3.7  CL  --  103   < > 99 98 99  CO2  --  25   < > '30 29 28  '$ GLUCOSE  --  185*   < > 119* 87 115*  BUN  --  13   < > '16 17 17  '$ CREATININE  --  0.74   < > 0.90 0.86 0.99  CALCIUM  --  8.9   < > 9.0 9.1 8.9  PROT 7.8 7.3  --   --   --   --   ALBUMIN 3.2* 3.1*  --   --   --   --   AST 33 26  --   --   --   --   ALT 29 26  --   --   --   --   ALKPHOS 122 116  --   --   --   --   BILITOT 2.4* 1.7*  --   --   --   --  GFRNONAA  --  >60   < > >60 >60 >60  ANIONGAP  --  9   < > '9 11 9   '$ < > = values in this interval not displayed.     Hematology Recent Labs  Lab 02/22/22 1739 02/23/22 0601  WBC 10.9* 9.6  RBC 4.66 4.76  HGB 11.4* 11.8*  HCT 35.6* 36.5  MCV 76.4* 76.7*  MCH 24.5* 24.8*  MCHC 32.0 32.3  RDW 14.7 14.6  PLT 313 317    Cardiac EnzymesNo results for input(s): "TROPONINI" in the last 168 hours. No results for input(s): "TROPIPOC" in the last 168 hours.   BNP Recent Labs  Lab 02/22/22 1706  BNP 443.3*     DDimer No results for input(s): "DDIMER" in the last 168 hours.   Radiology    No results found.  Cardiac Studies   Echo  02/23/22  1. 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 Left  ventricular ejection fraction, by estimation, is <20%. The left ventricle  has severely decreased function. The left ventricle demonstrates global  hypokinesis. The left ventricular  internal cavity size was mildly dilated. Left ventricular diastolic  parameters are consistent with Grade III diastolic dysfunction  (restrictive).   2. Right ventricular systolic function is moderately reduced. The right  ventricular size is normal. There is mildly elevated pulmonary artery  systolic pressure. The estimated right ventricular systolic pressure is  76.7 mmHg.   3. Left atrial size was mildly dilated.   4. The mitral valve is normal in structure. Mild  mitral valve  regurgitation. No evidence of mitral stenosis.   5. Tricuspid valve regurgitation is mild to moderate.   6. The aortic valve is tricuspid. Aortic valve regurgitation is not  visualized. No aortic stenosis is present.   7. The inferior vena cava is normal in size with <50% respiratory  variability, suggesting right atrial pressure of 8 mmHg.   Patient Profile     46 y.o. 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 Darrick Meigs  Assessment & Plan    Acute on Chronic Combined Systolic and Diastolic Heart Failure Nonischemic cardiomyopathy :     EF less than 20%.    Med titration limited by low BP.   Net negative 8.3 liters.   Changed to PO Lasix yesterday without significant urine output.  Increase Lasix today 80 in the AM and 40 PM at discharge.  She will need follow in the the HF Transition of Care clinic and we will let them know of this.    LV thrombus :  Large LV thrombus but failed warfarin in the past because of compliance.  Continue Xarelto.    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  No further work up.    Paroxymal Atrial Fibrillation :  Continue Xarelto.  Maintaining NSR.   For questions or updates, please contact [place] Please consult [email or web address] for contact info under Cardiology/STEMI.   Signed, Minus Breeding, MD  02/27/2022, 7:28 AM

## 2022-02-27 NOTE — Progress Notes (Signed)
Patient up to toilet for BM, started vomiting in trash can.  Zofran given.  Patient had been scheduled for discharge, notified MD of above.  New orders received.

## 2022-02-28 ENCOUNTER — Other Ambulatory Visit (HOSPITAL_COMMUNITY): Payer: Self-pay

## 2022-02-28 LAB — BASIC METABOLIC PANEL
Anion gap: 10 (ref 5–15)
BUN: 13 mg/dL (ref 6–20)
CO2: 25 mmol/L (ref 22–32)
Calcium: 8.8 mg/dL — ABNORMAL LOW (ref 8.9–10.3)
Chloride: 100 mmol/L (ref 98–111)
Creatinine, Ser: 1.01 mg/dL — ABNORMAL HIGH (ref 0.44–1.00)
GFR, Estimated: >60 mL/min (ref >60)
Glucose, Bld: 115 mg/dL — ABNORMAL HIGH (ref 70–99)
Potassium: 4 mmol/L (ref 3.5–5.1)
Sodium: 135 mmol/L (ref 135–145)

## 2022-02-28 LAB — GLUCOSE, CAPILLARY
Glucose-Capillary: 145 mg/dL — ABNORMAL HIGH (ref 70–99)
Glucose-Capillary: 228 mg/dL — ABNORMAL HIGH (ref 70–99)

## 2022-02-28 MED ORDER — RIVAROXABAN 20 MG PO TABS
20.0000 mg | ORAL_TABLET | Freq: Every day | ORAL | 3 refills | Status: DC
  Filled 2022-02-28: qty 30, 30d supply, fill #0

## 2022-02-28 MED ORDER — ONDANSETRON HCL 4 MG PO TABS
4.0000 mg | ORAL_TABLET | Freq: Three times a day (TID) | ORAL | 0 refills | Status: DC | PRN
  Filled 2022-02-28: qty 20, 7d supply, fill #0

## 2022-02-28 NOTE — Progress Notes (Signed)
Patient discharged to home. Prior to discharge, pt IV and tele were removed. Pt was given DC instructions regarding medications, appointments, and condition. Pt verbalized understanding and stated no concerns a this time. Pt stable at time of discharge and left in personal vehicle driven by boyfriend.

## 2022-02-28 NOTE — TOC Transition Note (Signed)
Transition of Care [facility]) - CM/SW Discharge Note   Patient Details  Name: [name] MRN: [id number] Date of Birth: July 12, 1976  Transition of Care [facility] CM/SW Contact:  Leeroy Cha, RN Phone Number: 02/28/2022, 10:37 AM   Clinical Narrative:    Patient discharge to return home with self care. No toc needs present.  Orders checked.   Final next level of care: Home/Self Care Barriers to Discharge: Barriers Resolved   Patient Goals and CMS Choice Patient states their goals for this hospitalization and ongoing recovery are:: to go home CMS Medicare.gov Compare Post Acute Care list provided to:: Patient Choice offered to / list presented to : Patient  Discharge Placement                       Discharge Plan and Services   Discharge Planning Services: CM Consult                                 Social Determinants of Health (SDOH) Interventions     Readmission Risk Interventions    10/01/2019   12:21 PM  Readmission Risk Prevention Plan  Transportation Screening Complete  PCP or Specialist Appt within 3-5 Days Complete  HRI or [place] Complete  Social Work Consult for [place] Planning/Counseling Complete  Palliative Care Screening Not Applicable

## 2022-03-01 ENCOUNTER — Telehealth: Payer: Self-pay

## 2022-03-01 NOTE — Patient Outreach (Signed)
  Care Coordination TOC Note Transition Care Management Unsuccessful Follow-up Telephone Call  Date of discharge and from where:  [facility] 02/22/22-02/28/22  Attempts:  1st Attempt  Reason for unsuccessful TCM follow-up call:  Unable to leave message

## 2022-03-02 ENCOUNTER — Telehealth: Payer: Self-pay

## 2022-03-02 NOTE — Patient Outreach (Signed)
  Care Coordination TOC Note Transition Care Management Unsuccessful Follow-up Telephone Call  Date of discharge and from where:  [facility] 02/22/22-02/28/22  Attempts:  2nd Attempt  Reason for unsuccessful TCM follow-up call:  Unable to leave message

## 2022-03-03 ENCOUNTER — Telehealth: Payer: Self-pay | Admitting: *Deleted

## 2022-03-03 ENCOUNTER — Ambulatory Visit: Payer: Self-pay

## 2022-03-03 NOTE — Telephone Encounter (Signed)
LVM for pt to call office to see about getting her scheduled for a f/u up appointment from the hospital as soon as possible.  Will send mychart message as well. Amedeo Detweiler Zimmerman Rumple, CMA   Tammy Current, DO  P [facility] can we get [name] scheduled for an outpatient follow up? She needs to be seen in the next week. Does not have to be scheduled with me. Thanks!

## 2022-03-03 NOTE — Patient Outreach (Signed)
  Care Coordination TOC Note Transition Care Management Unsuccessful Follow-up Telephone Call  Date of discharge and from where:  [facility] 02/22/22-02/28/22  Attempts:  3rd Attempt  Reason for unsuccessful TCM follow-up call:  Unable to leave message

## 2022-03-11 ENCOUNTER — Encounter: Payer: Self-pay | Admitting: Internal Medicine

## 2022-03-11 ENCOUNTER — Other Ambulatory Visit (HOSPITAL_COMMUNITY): Payer: Self-pay

## 2022-03-11 ENCOUNTER — Ambulatory Visit (INDEPENDENT_AMBULATORY_CARE_PROVIDER_SITE_OTHER): Payer: Medicaid Other | Admitting: Internal Medicine

## 2022-03-11 VITALS — BP 140/76 | HR 95 | Ht 59.0 in | Wt 178.4 lb

## 2022-03-11 DIAGNOSIS — I513 Intracardiac thrombosis, not elsewhere classified: Secondary | ICD-10-CM

## 2022-03-11 MED ORDER — WARFARIN SODIUM 7.5 MG PO TABS
ORAL_TABLET | ORAL | 1 refills | Status: DC
  Filled 2022-03-11: qty 45, 22d supply, fill #0

## 2022-03-11 NOTE — Patient Instructions (Addendum)
Medication Instructions:   CONTINUE  XARELTO FOR 3 DAYS AND THEN STOP  START: COUMADIN (WARFARIN)- PLEASE TAKE 1 and 1/2 TABLETS ONCE DAILY   PLEASE KEEP COUMADIN APPOINTMENT AS SCHEDULED   *If you need a refill on your cardiac medications before your next appointment, please call your pharmacy*  Lab Work: None Ordered At This Time.  If you have labs (blood work) drawn today and your tests are completely normal, you will receive your results only by: [place] (if you have MyChart) OR A paper copy in the mail If you have any lab test that is abnormal or we need to change your treatment, we will call you to review the results.  Testing/Procedures:  Your physician has requested that you have an echocardiogram IN [place]. Echocardiography is a painless test that uses sound waves to create images of your heart. It provides your doctor with information about the size and shape of your heart and how well your heart's chambers and valves are working. You may receive an ultrasound enhancing agent through an IV if needed to better visualize your heart during the echo.This procedure takes approximately one hour. There are no restrictions for this procedure. This will take place at the [address].   Follow-Up: At [facility], you and your health needs are our priority.  As part of our continuing mission to provide you with exceptional heart care, we have created designated Provider Care Teams.  These Care Teams include your primary Cardiologist (physician) and Advanced Practice Providers (APPs -  Physician Assistants and Nurse Practitioners) who all work together to provide you with the care you need, when you need it.  Your next appointment:   6 month(s)  The format for your next appointment:   In Person  Provider: Dr. Harl Bowie

## 2022-03-11 NOTE — Progress Notes (Signed)
Cardiology Office Note:    Date:  03/11/2022   ID:  [name], DOB 25-Oct-1975, MRN [id number]  PCP:  Darci Current, [place] Prov[facility] Cardiologist:  Loralie Champagne, MD Electrophysiologist:  Will Meredith Leeds, MD     Referring MD: Darci Current, DO   No chief complaint on file. Hospital FU  History of Present Illness:    [name] is a 46 y.o. 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%) felt to be nonischemic cardiomyopathy, paroxysmal atrial fibrillation on Xarelto, type 2 DM, anemia, HTN. Shewas initially diagnosed with nonischemic cardiomyopathy in 12/2018. Echocardiogram at that time showed EF 76-81%, diastolic function could not be evaluated as patient was in atrial fibrillation at the time of the study. She was seen by EP in 12/2018 as there were concerns that her cardiomyopathy was tachy induced. Patient was started on GDMT and her afib regiment was optimized. Follow up echocardiogram 3 months later in 03/2019 showed EF 15-72%,  diastolic parameters consistent with restrictive filling, LV diffuse hypokinesis, apical thrombus, moderate RV systolic dysfunction, moderate TR, mild MR. As her EF had continued to be low, she underwent R/L heart catheterization on 03/29/2019 to rule out ischemia. Cath showed no obstructive CAD, R>L heart failure with low PAPI suggesting significant RV dysfunction, primarily pulmonary venous HTN, low cardiac output, CI 2.07. Patient was started on coumadin for LV thrombus. She was following with the HF clinic.  After her cath, a follow up echo in 09/2019 showed EF <20%, moderate LVH, grade II diastolic dysfunction, severe RV dysfunction, moderately elevated pulmonary artery systolic pressure. Echo from 12/2019 did show some improvement in EF to 35-40%, grade I DD.    Patient was admitted to the hospital in 08/2021 for evaluation of CHF and chest pain. Echo from 09/18/21 showed EF 20%, grade III diastolic  dysfunction, elevated LVEDP, severely reduced RV systolic function, moderately elevated PA systolic pressure, mild MR. Underwent R/L heart catheterization on 09/20/21 that showed mild, nonobstructive CAD, nonischemic cardiomyopathy, elevated right and left heart filling pressures, preserved cardiac output, pulmonary venous htn. At that time, Dr. Aundra Dubin believed that her cardiomyopathy could be caused by poorly controlled DM or HTN. Patient underwent Cardiac MRI to rule out myocarditis on 09/20/21. Study showed mildly dilated LV with EF 23%, diffuse hypokinesis with inferior akinesis, moderate RV systolic dysfunction with EF 32%, no definitive evidence of prior MI, infiltrative disease, myocarditis. LV thrombus was not seen on cMRI, continued on coumadin for prevention. Later, patient was transitioned to Xarelto due to poor compliance with coumadin and subtherapeutic INRs. She was reportedly rude to staff and was dismissed from HF clinic.   She's had recurrent admissions thought 2/2 difficulty with medication compliance.   Patient was last seen by cardiology on 02/02/22 for an outpatient appointment. At that time, patient admitted to medication noncompliance. She had visited the ER for diffuse body pain and CHF, but she left before being seen. Encouraged medication compliance and diuretic use. She underwent CT chest negative for acute pulmonary embolus, ovoid hypodensity/filling defect within the left ventricular apex (needs echo to exclude LV thrombus), cardiomegaly with reflux of contrast into the hepatic veins consistent with elevated right heart pressures, heterogenous enhancement of the liver likely due to passive congestion. A repeat echo 02/23/2022 showed 2.2 x 1.4 cm thrombus in the LV with EF < 20%. RV function was moderately reduced. RVSP 44 mmhg. Mild MR. She was diuresed and continued on xarelto. Plan was  for to ensure FU before starting coumadin again    Today, She is having chest pressure under the  breast. It feels like a pin. She has no PND/orthopnea. No LE edema. She reports no N/V with blood tinge. She is unsure if she is pregnant.   Past Medical History:  Diagnosis Date   Abnormal Pap smear    Acute on chronic combined systolic and diastolic CHF (congestive heart failure) ([place]) 02/19/2019   Acute on chronic congestive heart failure (HCC)    Acute on chronic congestive heart failure (HCC)    Acute on chronic diastolic CHF (congestive heart failure) ([place]) 10/19/2021   Anemia    Asthma    Atrial fibrillation ([place])    history of paroxysmal atrial fibrillation   CHF exacerbation ([place]) 10/17/2021   Diabetes mellitus    DKA, type 2 ([place]) 02/06/2019   Elevated troponin 09/30/2019   Heart failure with reduced ejection fraction ([place])    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  Consider chronic etiology given continued fertility issues   Hypertension    Hypertensive emergency 09/30/2019   Major depressive disorder 02/08/2012   Reports that 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     Migraines    Morbid obesity ([place]) 02/06/2019   Nonischemic cardiomyopathy ([place]) 09/22/2021   Obstructive sleep apnea 06/30/2015   Polycystic ovarian disease     Past Surgical History:  Procedure Laterality Date   BIOPSY  11/05/2021   Procedure: BIOPSY;  Surgeon: Daryel November, MD;  Location: [facility] ENDOSCOPY;  Service: Gastroenterology;;   CERVIX LESION DESTRUCTION     CESAREAN SECTION     ESOPHAGOGASTRODUODENOSCOPY (EGD) WITH PROPOFOL N/A 11/05/2021   Procedure: ESOPHAGOGASTRODUODENOSCOPY (EGD) WITH PROPOFOL;  Surgeon: Daryel November, MD;  Location: [place];  Service: Gastroenterology;  Laterality: N/A;   RIGHT/LEFT HEART CATH AND CORONARY ANGIOGRAPHY N/A 03/29/2019   Procedure: RIGHT/LEFT HEART CATH AND CORONARY ANGIOGRAPHY;  Surgeon: Larey Dresser, MD;  Location: [place] CV LAB;  Service: Cardiovascular;  Laterality: N/A;   RIGHT/LEFT HEART CATH AND CORONARY ANGIOGRAPHY N/A 09/20/2021   Procedure: RIGHT/LEFT HEART CATH AND CORONARY ANGIOGRAPHY;  Surgeon: Larey Dresser, MD;  Location: [place] CV LAB;  Service: Cardiovascular;  Laterality: N/A;    Current Medications: Current Meds  Medication Sig   Accu-Chek Softclix Lancets lancets Use as instructed   acetaminophen (TYLENOL) 325 MG tablet Take 2 tablets (650 mg total) by mouth every 6 (six) hours as needed for mild pain (or Fever >/= 101).   albuterol (VENTOLIN HFA) 108 (90 Base) MCG/ACT inhaler Inhale 2 puffs into the lungs every 6 (six) hours as needed for wheezing or shortness of breath.   atorvastatin (LIPITOR) 40 MG tablet TAKE 1 TABLET BY MOUTH DAILY AT 6 PM.   blood glucose meter kit and supplies KIT Dispense based on patient and insurance preference. Use up to four times daily as directed. (FOR ICD-9 250.00, 250.01).   Blood Glucose Monitoring Suppl (ACCU-CHEK AVIVA PLUS) w/Device KIT 1 application by Does not apply route QID. Check fasting sugars as well as throughout the day   carvedilol (COREG) 3.125 MG tablet Take 1 tablet (3.125 mg total) by mouth 2 (two) times daily with a meal.   digoxin (LANOXIN) 0.125 MG tablet Take 1 tablet (0.125 mg total) by mouth daily.   empagliflozin (JARDIANCE) 10 MG  TABS tablet Take 1 tablet (10 mg total) by mouth daily.   furosemide (LASIX) 40 MG tablet Take 1 tablet (40 mg total) by mouth every evening.   furosemide (LASIX) 80 MG tablet Take 1 tablet (80 mg total) by mouth every morning.   glucose blood (ACCU-CHEK AVIVA PLUS) test strip Check CBG 2 times per day   glucose blood (AGAMATRIX PRESTO TEST) test strip Use as instructed   hydrOXYzine (ATARAX) 10 MG tablet Take 1 tablet (10 mg total) by mouth 3 (three) times daily as needed. (Patient taking differently: Take 10 mg by mouth 3 (three) times daily as needed for anxiety.)   insulin aspart (NOVOLOG  FLEXPEN) 100 UNIT/ML FlexPen Inject 12 Units into the skin daily before supper.   insulin glargine (LANTUS) 100 UNIT/ML Solostar Pen Inject 36 Units into the skin every morning.   Insulin Pen Needle (UNIFINE PENTIPS) 31G X 5 MM MISC USE AS DIRECTED 2 TIMES DAILY   Insulin Syringe-Needle U-100 (INSULIN SYRINGE .3CC/31GX5/16") 31G X 5/16" 0.3 ML MISC 15 Units by Does not apply route 2 (two) times daily.   Lancets Misc. (ACCU-CHEK SOFTCLIX LANCET DEV) KIT 1 application by Does not apply route 2 (two) times daily.   losartan (COZAAR) 25 MG tablet Take 1 tablet (25 mg total) by mouth daily.   metoCLOPramide (REGLAN) 5 MG tablet Take 1 tablet (5 mg total) by mouth every 8 (eight) hours as needed for nausea.   ondansetron (ZOFRAN) 4 MG tablet Take 1 tablet (4 mg total) by mouth every 8 (eight) hours as needed for nausea or vomiting.   pantoprazole (PROTONIX) 40 MG tablet Take 1 tablet (40 mg total) by mouth daily.   spironolactone (ALDACTONE) 25 MG tablet Take 0.5 tablets (12.5 mg total) by mouth daily.   venlafaxine XR (EFFEXOR XR) 37.5 MG 24 hr capsule Take 1 capsule (37.5 mg total) by mouth daily with breakfast.   warfarin (COUMADIN) 7.5 MG tablet Take 1-2 tablets daily as directed.   [DISCONTINUED] rivaroxaban (XARELTO) 20 MG TABS tablet Take 1 tablet (20 mg total) by mouth daily with supper.     Allergies:   Shellfish allergy, Metformin and related, Trulicity [dulaglutide], and Ibuprofen   Social History   Socioeconomic History   Marital status: Single    Spouse name: Not on file   Number of children: Not on file   Years of education: Not on file   Highest education level: Not on file  Occupational History   Not on file  Tobacco Use   Smoking status: Never   Smokeless tobacco: Never   Tobacco comments:    Never smoke 10/22/21  Vaping Use   Vaping Use: Never used  Substance and Sexual Activity   Alcohol use: No    Alcohol/week: 0.0 standard drinks of alcohol   Drug use: No   Sexual  activity: Yes  Other Topics Concern   Not on file  Social History Narrative   Not on file   Social Determinants of Health   Financial Resource Strain: High Risk (10/17/2019)   Overall Financial Resource Strain (CARDIA)    Difficulty of Paying Living Expenses: Hard  Food Insecurity: No Food Insecurity (08/19/2020)   Hunger Vital Sign    Worried About Running Out of Food in the Last Year: Never true    Ran Out of Food in the Last Year: Never true  Transportation Needs: Unmet Transportation Needs (10/22/2021)   PRAPARE - Hydrologist (Medical): Yes  Lack of Transportation (Non-Medical): Yes  Physical Activity: Insufficiently Active (08/19/2020)   Exercise Vital Sign    Days of Exercise per Week: 7 days    Minutes of Exercise per Session: 20 min  Stress: Stress Concern Present (08/19/2020)   [place]    Feeling of Stress : Very much  Social Connections: Not on file     Family History: The patient's family history includes Diabetes in her father and mother; Hypertension in her father and mother.  ROS:   Please see the history of present illness.     All other systems reviewed and are negative.  EKGs/Labs/Other Studies Reviewed:    The following studies were reviewed today:   EKG:  none today  Recent Labs: 11/01/2021: TSH 1.730 02/22/2022: B Natriuretic Peptide 443.3 02/23/2022: ALT 26; Magnesium 2.3 02/27/2022: Hemoglobin 13.6; Platelets 374 02/28/2022: BUN 13; Creatinine, Ser 1.01; Potassium 4.0; Sodium 135  Recent Lipid Panel    Component Value Date/Time   CHOL 222 (H) 09/18/2021 0328   CHOL 266 (H) 10/20/2017 1054   TRIG 97 09/18/2021 0328   HDL 43 09/18/2021 0328   HDL 43 10/20/2017 1054   CHOLHDL 5.2 09/18/2021 0328   VLDL 19 09/18/2021 0328   LDLCALC 160 (H) 09/18/2021 0328   LDLCALC 195 (H) 10/20/2017 1054   LDLDIRECT 154 (H) 01/20/2014 1522     Risk  Assessment/Calculations:        Physical Exam:    VS:  BP (!) 140/76   Pulse 95   Ht _0  (1.499 m)   Wt 178 lb 6.4 oz (80.9 kg)   LMP 02/02/2022   SpO2 97%   BMI 36.03 kg/m     Wt Readings from Last 3 Encounters:  03/11/22 178 lb 6.4 oz (80.9 kg)  02/28/22 177 lb 14.6 oz (80.7 kg)  02/10/22 190 lb 2 oz (86.2 kg)     GEN:  Well nourished, well developed in no acute distress HEENT: Normal NECK: No sig JVD LYMPHATICS: No lymphadenopathy CARDIAC: RRR, no murmurs, rubs, gallops RESPIRATORY:  Clear to auscultation without rales, wheezing or rhonchi  ABDOMEN: Soft, non-tender, non-distended MUSCULOSKELETAL:  No edema; No deformity  SKIN: Warm and dry NEUROLOGIC:  Alert and oriented x 3 PSYCHIATRIC:  flat affect   ASSESSMENT:   N/V with report blood tinge: could be mallory weiss tear. She was encouraged to get a pregnancy test.   NYHA Class II-III Non Ischemic CM: She is euvolemic today. Will continue GDMT.  She had low Bps in the past with entresto. Once LV thrombus resolves, will consider primary prevention ICD (subQ?) -continue coreg 3.125 mg BID -continue losartan 25 mg daily -continue spironolactone 12.5 mg daily  -continue jardiance 10 mg daily - continue lasix 7m AM, 40 mg PM  HLD: continue atorvastatin 40 mg daily  LV thrombus -stop xarelto -start coumadin,  - established with coumadin clinic today  Afib: [id number] -continue digoxin   PLAN:    In order of problems listed above:  Stop xarelto Coumadin clinic  TTE limited echo LV thrombus 3 months Return in 6 months           Medication Adjustments/Labs and Tests Ordered: Current medicines are reviewed at length with the patient today.  Concerns regarding medicines are outlined above.  Orders Placed This Encounter  Procedures   ECHOCARDIOGRAM LIMITED   Meds ordered this encounter  Medications   warfarin (COUMADIN) 7.5 MG tablet    Sig: Take  1-2 tablets daily as directed.     Dispense:  45 tablet    Refill:  1    Patient Instructions  Medication Instructions:   CONTINUE  XARELTO FOR 3 DAYS AND THEN STOP  START: COUMADIN (WARFARIN)- PLEASE TAKE 1 and 1/2 TABLETS ONCE DAILY   PLEASE KEEP COUMADIN APPOINTMENT AS SCHEDULED   *If you need a refill on your cardiac medications before your next appointment, please call your pharmacy*  Lab Work: None Ordered At This Time.  If you have labs (blood work) drawn today and your tests are completely normal, you will receive your results only by: [place] (if you have MyChart) OR A paper copy in the mail If you have any lab test that is abnormal or we need to change your treatment, we will call you to review the results.  Testing/Procedures:  Your physician has requested that you have an echocardiogram IN [place]. Echocardiography is a painless test that uses sound waves to create images of your heart. It provides your doctor with information about the size and shape of your heart and how well your heart's chambers and valves are working. You may receive an ultrasound enhancing agent through an IV if needed to better visualize your heart during the echo.This procedure takes approximately one hour. There are no restrictions for this procedure. This will take place at the [address].   Follow-Up: At [facility], you and your health needs are our priority.  As part of our continuing mission to provide you with exceptional heart care, we have created designated Provider Care Teams.  These Care Teams include your primary Cardiologist (physician) and Advanced Practice Providers (APPs -  Physician Assistants and Nurse Practitioners) who all work together to provide you with the care you need, when you need it.  Your next appointment:   6 month(s)  The format for your next appointment:   In Person  Provider: Dr. Harl Bowie            Signed, Janina Mayo, MD  03/11/2022 12:37 PM    [place]

## 2022-03-18 ENCOUNTER — Other Ambulatory Visit: Payer: Self-pay

## 2022-03-18 DIAGNOSIS — Z5181 Encounter for therapeutic drug level monitoring: Secondary | ICD-10-CM

## 2022-03-18 DIAGNOSIS — I480 Paroxysmal atrial fibrillation: Secondary | ICD-10-CM

## 2022-03-18 DIAGNOSIS — I513 Intracardiac thrombosis, not elsewhere classified: Secondary | ICD-10-CM

## 2022-03-30 ENCOUNTER — Other Ambulatory Visit (HOSPITAL_COMMUNITY): Payer: Self-pay

## 2022-03-31 ENCOUNTER — Ambulatory Visit: Payer: Medicaid Other | Attending: Cardiology

## 2022-03-31 DIAGNOSIS — Z5181 Encounter for therapeutic drug level monitoring: Secondary | ICD-10-CM

## 2022-03-31 DIAGNOSIS — I480 Paroxysmal atrial fibrillation: Secondary | ICD-10-CM

## 2022-03-31 LAB — POCT INR: INR: 4.6 — AB (ref 2.0–3.0)

## 2022-03-31 NOTE — Patient Instructions (Signed)
HOLD TODAY and FRIDAY then Continue taking Warfarin 1.5 tablets daily. Recheck INR in 1 week. Coumadin Clinic [phone].

## 2022-04-08 ENCOUNTER — Ambulatory Visit: Payer: Medicaid Other | Attending: Internal Medicine | Admitting: *Deleted

## 2022-04-08 ENCOUNTER — Emergency Department (HOSPITAL_COMMUNITY): Payer: Medicare Other

## 2022-04-08 ENCOUNTER — Emergency Department (HOSPITAL_COMMUNITY)
Admission: EM | Admit: 2022-04-08 | Discharge: 2022-04-09 | Disposition: A | Discharge status: Home / Self Care | Payer: Medicare Other | Attending: Emergency Medicine | Admitting: Emergency Medicine

## 2022-04-08 ENCOUNTER — Encounter (HOSPITAL_COMMUNITY): Payer: Self-pay | Admitting: Emergency Medicine

## 2022-04-08 DIAGNOSIS — Z20822 Contact with and (suspected) exposure to covid-19: Secondary | ICD-10-CM | POA: Insufficient documentation

## 2022-04-08 DIAGNOSIS — Z79899 Other long term (current) drug therapy: Secondary | ICD-10-CM | POA: Diagnosis not present

## 2022-04-08 DIAGNOSIS — I10 Essential (primary) hypertension: Secondary | ICD-10-CM | POA: Insufficient documentation

## 2022-04-08 DIAGNOSIS — R609 Edema, unspecified: Secondary | ICD-10-CM

## 2022-04-08 DIAGNOSIS — R1110 Vomiting, unspecified: Secondary | ICD-10-CM | POA: Diagnosis not present

## 2022-04-08 DIAGNOSIS — R079 Chest pain, unspecified: Secondary | ICD-10-CM | POA: Insufficient documentation

## 2022-04-08 DIAGNOSIS — I513 Intracardiac thrombosis, not elsewhere classified: Secondary | ICD-10-CM

## 2022-04-08 DIAGNOSIS — I480 Paroxysmal atrial fibrillation: Secondary | ICD-10-CM

## 2022-04-08 DIAGNOSIS — Z794 Long term (current) use of insulin: Secondary | ICD-10-CM | POA: Insufficient documentation

## 2022-04-08 DIAGNOSIS — E119 Type 2 diabetes mellitus without complications: Secondary | ICD-10-CM | POA: Diagnosis not present

## 2022-04-08 DIAGNOSIS — R0600 Dyspnea, unspecified: Secondary | ICD-10-CM

## 2022-04-08 DIAGNOSIS — Z7901 Long term (current) use of anticoagulants: Secondary | ICD-10-CM | POA: Insufficient documentation

## 2022-04-08 DIAGNOSIS — Z5181 Encounter for therapeutic drug level monitoring: Secondary | ICD-10-CM

## 2022-04-08 DIAGNOSIS — I4891 Unspecified atrial fibrillation: Secondary | ICD-10-CM | POA: Insufficient documentation

## 2022-04-08 LAB — I-STAT BETA HCG BLOOD, ED (MC, WL, AP ONLY): I-stat hCG, quantitative: <5 m[IU]/mL (ref <5)

## 2022-04-08 LAB — SARS CORONAVIRUS 2 BY RT PCR: SARS Coronavirus 2 by RT PCR: NEGATIVE

## 2022-04-08 LAB — BASIC METABOLIC PANEL
Anion gap: 9 (ref 5–15)
BUN: 12 mg/dL (ref 6–20)
CO2: 22 mmol/L (ref 22–32)
Calcium: 8.6 mg/dL — ABNORMAL LOW (ref 8.9–10.3)
Chloride: 100 mmol/L (ref 98–111)
Creatinine, Ser: 0.86 mg/dL (ref 0.44–1.00)
GFR, Estimated: >60 mL/min (ref >60)
Glucose, Bld: 385 mg/dL — ABNORMAL HIGH (ref 70–99)
Potassium: 3.6 mmol/L (ref 3.5–5.1)
Sodium: 131 mmol/L — ABNORMAL LOW (ref 135–145)

## 2022-04-08 LAB — CBC
HCT: 33.9 % — ABNORMAL LOW (ref 36.0–46.0)
Hemoglobin: 10.9 g/dL — ABNORMAL LOW (ref 12.0–15.0)
MCH: 23.9 pg — ABNORMAL LOW (ref 26.0–34.0)
MCHC: 32.2 g/dL (ref 30.0–36.0)
MCV: 74.3 fL — ABNORMAL LOW (ref 80.0–100.0)
Platelets: 297 10*3/uL (ref 150–400)
RBC: 4.56 MIL/uL (ref 3.87–5.11)
RDW: 15.9 % — ABNORMAL HIGH (ref 11.5–15.5)
WBC: 8.4 10*3/uL (ref 4.0–10.5)
nRBC: 0 % (ref 0.0–0.2)

## 2022-04-08 LAB — HEPATIC FUNCTION PANEL
ALT: 25 U/L (ref 0–44)
AST: 38 U/L (ref 15–41)
Albumin: 3 g/dL — ABNORMAL LOW (ref 3.5–5.0)
Alkaline Phosphatase: 118 U/L (ref 38–126)
Bilirubin, Direct: 0.2 mg/dL (ref 0.0–0.2)
Indirect Bilirubin: 1.1 mg/dL — ABNORMAL HIGH (ref 0.3–0.9)
Total Bilirubin: 1.3 mg/dL — ABNORMAL HIGH (ref 0.3–1.2)
Total Protein: 6.9 g/dL (ref 6.5–8.1)

## 2022-04-08 LAB — PROTIME-INR
INR: 2.8 — ABNORMAL HIGH (ref 0.8–1.2)
Prothrombin Time: 29 seconds — ABNORMAL HIGH (ref 11.4–15.2)

## 2022-04-08 LAB — TROPONIN I (HIGH SENSITIVITY)
Troponin I (High Sensitivity): 12 ng/L (ref <18)
Troponin I (High Sensitivity): 10 ng/L (ref <18)

## 2022-04-08 LAB — POCT INR: INR: 3.2 — AB (ref 2.0–3.0)

## 2022-04-08 LAB — LIPASE, BLOOD: Lipase: 50 U/L (ref 11–51)

## 2022-04-08 MED ORDER — MORPHINE SULFATE (PF) 2 MG/ML IV SOLN
2.0000 mg | Freq: Once | INTRAVENOUS | Status: AC
  Administered 2022-04-08: 2 mg via INTRAVENOUS
  Filled 2022-04-08: qty 1

## 2022-04-08 MED ORDER — FUROSEMIDE 10 MG/ML IJ SOLN
80.0000 mg | Freq: Once | INTRAMUSCULAR | Status: AC
  Administered 2022-04-08: 80 mg via INTRAVENOUS
  Filled 2022-04-08: qty 8

## 2022-04-08 MED ORDER — SODIUM CHLORIDE 0.9 % IV SOLN
12.5000 mg | Freq: Four times a day (QID) | INTRAVENOUS | Status: DC | PRN
  Administered 2022-04-08: 12.5 mg via INTRAVENOUS
  Filled 2022-04-08: qty 12.5

## 2022-04-08 MED ORDER — IOHEXOL 300 MG/ML  SOLN
100.0000 mL | Freq: Once | INTRAMUSCULAR | Status: AC | PRN
  Administered 2022-04-08: 100 mL via INTRAVENOUS

## 2022-04-08 NOTE — ED Provider Notes (Signed)
[place] DEPT Provider Note   CSN: [id number] Arrival date & time: 04/08/22  1755     History  Chief Complaint  Patient presents with   Chest Pain    RAYME BUI is a 46 y.o. female with a history of nonischemic cardiomyopathy, paroxysmal A-fib on Coumadin, type 2 diabetes, anemia, hypertension, presenting to the ED with complaint of shortness of breath, chest pain, leg swelling, nausea.  The patient reports her primary concern is has not been able to keep down food or fluids and difficulty keeping down medications for the past several days she feels extremely nauseated.  She says she wants to throw up every time she eats.  She is having chest pressure which is nonspecific, unclear when it started. It feels similar to prior episode she has had a pressure in her chest  She has also noted swelling in her bilateral legs, and is complaining about pain in her legs due to tension from the water.  She is on Lasix 80 mg in the morning and 40 mg at night.  Per my review of external records the patient had an echocardiogram earlier this year which was noting left ventricular thrombus and was initially on IV heparin in the hospital, subsequently switched to Xarelto, and 1 month ago switched now to Coumadin.  She reports that her INR level was supratherapeutic this week.   On 03/31/22 INR was 4.6.  LHC from Feb 2023 showing Mid Cx 30% stenosis, Prox RCA 25% stenosis, noting otherwise mild nonobstructive coronary disease and nonischemic cardiomyopathy.  HPI     Home Medications Prior to Admission medications   Medication Sig Start Date End Date Taking? Authorizing Provider  Accu-Chek Softclix Lancets lancets Use as instructed 02/12/19   Caroline More, DO  acetaminophen (TYLENOL) 325 MG tablet Take 2 tablets (650 mg total) by mouth every 6 (six) hours as needed for mild pain (or Fever >/= 101). 02/27/22   Oswald Hillock, MD  albuterol (VENTOLIN HFA) 108 (90 Base)  MCG/ACT inhaler Inhale 2 puffs into the lungs every 6 (six) hours as needed for wheezing or shortness of breath. 09/16/21 09/16/22  Lilland, Lorrin Goodell, DO  atorvastatin (LIPITOR) 40 MG tablet TAKE 1 TABLET BY MOUTH DAILY AT 6 PM. 04/01/20 08/27/22  Larey Dresser, MD  blood glucose meter kit and supplies KIT Dispense based on patient and insurance preference. Use up to four times daily as directed. (FOR ICD-9 250.00, 250.01). 09/27/21   Autry-Lott, Naaman Plummer, DO  Blood Glucose Monitoring Suppl (ACCU-CHEK AVIVA PLUS) w/Device KIT 1 application by Does not apply route QID. Check fasting sugars as well as throughout the day 02/12/19   Caroline More, DO  carvedilol (COREG) 3.125 MG tablet Take 1 tablet (3.125 mg total) by mouth 2 (two) times daily with a meal. 02/27/22   Oswald Hillock, MD  digoxin (LANOXIN) 0.125 MG tablet Take 1 tablet (0.125 mg total) by mouth daily. 11/08/21   Sharion Settler, DO  empagliflozin (JARDIANCE) 10 MG TABS tablet Take 1 tablet (10 mg total) by mouth daily. 11/08/21   Sharion Settler, DO  furosemide (LASIX) 40 MG tablet Take 1 tablet (40 mg total) by mouth every evening. 02/27/22   Oswald Hillock, MD  furosemide (LASIX) 80 MG tablet Take 1 tablet (80 mg total) by mouth every morning. 02/27/22   Oswald Hillock, MD  glucose blood (ACCU-CHEK AVIVA PLUS) test strip Check CBG 2 times per day 08/16/19   [name], DO  glucose  blood (AGAMATRIX PRESTO TEST) test strip Use as instructed 10/23/17   Zenia Resides, MD  hydrOXYzine (ATARAX) 10 MG tablet Take 1 tablet (10 mg total) by mouth 3 (three) times daily as needed. Patient taking differently: Take 10 mg by mouth 3 (three) times daily as needed for anxiety. 10/22/21   Gladys Damme, MD  insulin aspart (NOVOLOG FLEXPEN) 100 UNIT/ML FlexPen Inject 12 Units into the skin daily before supper. 09/02/21   Zenia Resides, MD  insulin glargine (LANTUS) 100 UNIT/ML Solostar Pen Inject 36 Units into the skin every morning. 09/02/21   Zenia Resides,  MD  Insulin Pen Needle (UNIFINE PENTIPS) 31G X 5 MM MISC USE AS DIRECTED 2 TIMES DAILY 09/02/21   Zenia Resides, MD  Insulin Syringe-Needle U-100 (INSULIN SYRINGE .3CC/31GX5/16") 31G X 5/16" 0.3 ML MISC 15 Units by Does not apply route 2 (two) times daily. 09/05/20   Rai, Vernelle Emerald, MD  Lancets Misc. (ACCU-CHEK SOFTCLIX LANCET DEV) KIT 1 application by Does not apply route 2 (two) times daily. 08/16/19   [name], DO  losartan (COZAAR) 25 MG tablet Take 1 tablet (25 mg total) by mouth daily. 02/28/22   Oswald Hillock, MD  metoCLOPramide (REGLAN) 5 MG tablet Take 1 tablet (5 mg total) by mouth every 8 (eight) hours as needed for nausea. 11/05/21 11/05/22  Erskine Emery, MD  ondansetron (ZOFRAN) 4 MG tablet Take 1 tablet (4 mg total) by mouth every 8 (eight) hours as needed for nausea or vomiting. 02/28/22   Oswald Hillock, MD  pantoprazole (PROTONIX) 40 MG tablet Take 1 tablet (40 mg total) by mouth daily. 02/27/22   Oswald Hillock, MD  spironolactone (ALDACTONE) 25 MG tablet Take 0.5 tablets (12.5 mg total) by mouth daily. 11/08/21   Sharion Settler, DO  venlafaxine XR (EFFEXOR XR) 37.5 MG 24 hr capsule Take 1 capsule (37.5 mg total) by mouth daily with breakfast. 02/10/22   Lind Covert, MD  warfarin (COUMADIN) 7.5 MG tablet Take 1-2 tablets daily as directed. 03/11/22   Janina Mayo, MD      Allergies    Shellfish allergy, Metformin and related, Trulicity [dulaglutide], and Ibuprofen    Review of Systems   Review of Systems  Physical Exam Updated Vital Signs BP (!) 154/112   Pulse 83   Temp 98.7 F (37.1 C) (Oral)   Resp 15   LMP 04/08/2022   SpO2 100%  Physical Exam Constitutional:      General: She is not in acute distress. HENT:     Head: Normocephalic and atraumatic.  Eyes:     Conjunctiva/sclera: Conjunctivae normal.     Pupils: Pupils are equal, round, and reactive to light.  Cardiovascular:     Rate and Rhythm: Normal rate and regular rhythm.  Pulmonary:      Effort: Pulmonary effort is normal. No respiratory distress.  Abdominal:     General: There is no distension.     Tenderness: There is no abdominal tenderness.  Musculoskeletal:     Right lower leg: Edema present.     Left lower leg: Edema present.  Skin:    General: Skin is warm and dry.  Neurological:     General: No focal deficit present.     Mental Status: She is alert. Mental status is at baseline.  Psychiatric:        Mood and Affect: Mood normal.        Behavior: Behavior normal.  ED Results / Procedures / Treatments   Labs (all labs ordered are listed, but only abnormal results are displayed) Labs Reviewed  BASIC METABOLIC PANEL - Abnormal; Notable for the following components:      Result Value   Sodium 131 (*)    Glucose, Bld 385 (*)    Calcium 8.6 (*)    All other components within normal limits  CBC - Abnormal; Notable for the following components:   Hemoglobin 10.9 (*)    HCT 33.9 (*)    MCV 74.3 (*)    MCH 23.9 (*)    RDW 15.9 (*)    All other components within normal limits  HEPATIC FUNCTION PANEL - Abnormal; Notable for the following components:   Albumin 3.0 (*)    Total Bilirubin 1.3 (*)    Indirect Bilirubin 1.1 (*)    All other components within normal limits  PROTIME-INR - Abnormal; Notable for the following components:   Prothrombin Time 29.0 (*)    INR 2.8 (*)    All other components within normal limits  SARS CORONAVIRUS 2 BY RT PCR  LIPASE, BLOOD  I-STAT BETA HCG BLOOD, ED ([facility], [facility], [facility] ONLY)  TROPONIN I (HIGH SENSITIVITY)  TROPONIN I (HIGH SENSITIVITY)    EKG EKG Interpretation  Date/Time:  Friday April 08 2022 19:35:41 EDT Ventricular Rate:  99 PR Interval:  132 QRS Duration: 76 QT Interval:  350 QTC Calculation: 449 R Axis:   -35 Text Interpretation: Normal sinus rhythm Possible Left atrial enlargement Left axis deviation Possible Anterior infarct , age undetermined Abnormal ECG When compared with ECG of 22-Feb-2022 17:02,  PREVIOUS ECG IS PRESENT No significant change from Feb 22 2022 tracing Confirmed by Octaviano Glow [phone]) on 04/08/2022 9:14:56 PM  Radiology DG Chest 2 View  Result Date: 04/08/2022 CLINICAL DATA:  Chest pain, short of breath, hypertension EXAM: CHEST - 2 VIEW COMPARISON:  02/22/2022 FINDINGS: Frontal and lateral views of the chest demonstrates stable enlargement of the cardiac silhouette. No acute airspace disease, effusion, or pneumothorax. No acute bony abnormalities. IMPRESSION: 1. Stable enlarged cardiac silhouette. No acute intrathoracic process. Electronically Signed   By: Randa Ngo M.D.   On: 04/08/2022 19:54    Procedures Procedures    Medications Ordered in ED Medications  promethazine (PHENERGAN) 12.5 mg in sodium chloride 0.9 % 50 mL IVPB (has no administration in time range)  morphine (PF) 2 MG/ML injection 2 mg (2 mg Intravenous Given 04/08/22 2258)  furosemide (LASIX) injection 80 mg (80 mg Intravenous Given 04/08/22 2258)    ED Course/ Medical Decision Making/ A&P                           Medical Decision Making Amount and/or Complexity of Data Reviewed Labs: ordered. Radiology: ordered.  Risk Prescription drug management.   This patient presents to the ED with concern for multiple medical complaints, including leg swelling, nausea and vomiting, chest pressure. This involves an extensive number of treatment options, and is a complaint that carries with it a high risk of complications and morbidity.  The differential diagnosis includes atypical ACS versus congestive heart failure exacerbation versus viral syndrome versus pneumonia versus other  Co-morbidities that complicate the patient evaluation: History of significant nonischemic cardiomyopathy, on high-dose diuretics at home, and also left ventricular thrombus, and the A-fib, are all cardiovascular risk factors.  External records from outside source obtained and reviewed including left heart catheter report  from this year  as well as most recent echocardiogram and cardiology office evaluation  I ordered and personally interpreted labs.  The pertinent results include:  no emergent findings  I ordered imaging studies including x-ray of the chest I independently visualized and interpreted imaging which showed stable cardiomegaly without focal infiltrate or pneumothorax I agree with the radiologist interpretation  CT abdomen pelvis pending at the time of signout  The patient was maintained on a cardiac monitor.  I personally viewed and interpreted the cardiac monitored which showed an underlying rhythm of: Sinus rhythm  Per my interpretation the patient's ECG shows sinus rhythm with no acute ischemic findings  I ordered medication including Phenergan for nausea, morphine for pain. IV lasix for diuresis  I have reviewed the patients home medicines and have made adjustments as needed  Test Considered: Lower suspicion for acute PE she is clinically anticoagulated on Coumadin.  Dispostion:  Signed out to Dr Laverta Baltimore EDP pending f/u on CT imaging         Final Clinical Impression(s) / ED Diagnoses Final diagnoses:  None    Rx / DC Orders ED Discharge Orders     None         Lakedra Washington, Carola Rhine, MD 04/08/22 2307

## 2022-04-08 NOTE — Patient Instructions (Signed)
Description   Today take 1/2 tablet of warfarin then start taking Warfarin 1.5 tablets daily except 1 tablet on Mondays. Recheck INR in 1 week. [facility] [phone].

## 2022-04-08 NOTE — ED Provider Notes (Signed)
Blood pressure (!) 149/111, pulse 88, temperature 98.7 F (37.1 C), temperature source Oral, resp. rate (!) 23, last menstrual period 04/08/2022, SpO2 97 %.  Assuming care from Dr. Langston Masker.  In short, [name] is a 46 y.o. female with a chief complaint of Chest Pain .  Refer to the original H&P for additional details.  The current plan of care is to follow up on CT abdomen/pelvis and reassess.  01:20 AM  COVID-negative.  Repeat troponin unremarkable.  Patient CT scan without acute abnormality. Patient tolerating PO. No hypoxemia, increased WOB, or other distress. Plan for continued outpatient diuresis and PCP/Cardiology follow up on Monday.     Margette Fast, MD 04/09/22 [phone]

## 2022-04-08 NOTE — ED Triage Notes (Addendum)
Patient reports chest pressure x1 week. Reports recent NSTEMI 8/1. State seen at coumadin clinic today with abnormal lab. Patient also reports swelling to bilateral feet and legs x4 days.

## 2022-04-09 MED ORDER — ONDANSETRON 4 MG PO TBDP: 4.0000 mg | ORAL_TABLET | Freq: Three times a day (TID) | ORAL | 0 refills | Status: DC | PRN

## 2022-04-09 NOTE — Discharge Instructions (Addendum)
You were seen in the emergency room today with leg swelling.  Please continue your home Lasix and call your cardiology team on Monday for guidance on adjusting her meds as needed. Return with any new or suddenly worsening symptoms.

## 2022-04-09 NOTE — ED Notes (Signed)
Back from CT

## 2022-04-18 ENCOUNTER — Encounter (HOSPITAL_COMMUNITY): Payer: Self-pay

## 2022-04-18 ENCOUNTER — Ambulatory Visit: Payer: Medicaid Other

## 2022-04-18 ENCOUNTER — Emergency Department (HOSPITAL_COMMUNITY)
Admission: EM | Admit: 2022-04-18 | Discharge: 2022-04-19 | Disposition: A | Discharge status: Home / Self Care | Payer: Medicare Other | Attending: Emergency Medicine | Admitting: Emergency Medicine

## 2022-04-18 ENCOUNTER — Emergency Department (HOSPITAL_COMMUNITY): Payer: Medicare Other

## 2022-04-18 DIAGNOSIS — I4891 Unspecified atrial fibrillation: Secondary | ICD-10-CM | POA: Diagnosis not present

## 2022-04-18 DIAGNOSIS — Z79899 Other long term (current) drug therapy: Secondary | ICD-10-CM | POA: Insufficient documentation

## 2022-04-18 DIAGNOSIS — E1165 Type 2 diabetes mellitus with hyperglycemia: Secondary | ICD-10-CM | POA: Diagnosis not present

## 2022-04-18 DIAGNOSIS — Z794 Long term (current) use of insulin: Secondary | ICD-10-CM | POA: Insufficient documentation

## 2022-04-18 DIAGNOSIS — I110 Hypertensive heart disease with heart failure: Secondary | ICD-10-CM | POA: Insufficient documentation

## 2022-04-18 DIAGNOSIS — I10 Essential (primary) hypertension: Secondary | ICD-10-CM

## 2022-04-18 DIAGNOSIS — I509 Heart failure, unspecified: Secondary | ICD-10-CM | POA: Insufficient documentation

## 2022-04-18 DIAGNOSIS — R531 Weakness: Secondary | ICD-10-CM | POA: Diagnosis present

## 2022-04-18 DIAGNOSIS — R739 Hyperglycemia, unspecified: Secondary | ICD-10-CM

## 2022-04-18 LAB — BASIC METABOLIC PANEL WITH GFR
Anion gap: 10 (ref 5–15)
BUN: 20 mg/dL (ref 6–20)
CO2: 18 mmol/L — ABNORMAL LOW (ref 22–32)
Calcium: 8.8 mg/dL — ABNORMAL LOW (ref 8.9–10.3)
Chloride: 102 mmol/L (ref 98–111)
Creatinine, Ser: 1.03 mg/dL — ABNORMAL HIGH (ref 0.44–1.00)
GFR, Estimated: >60 mL/min
Glucose, Bld: 430 mg/dL — ABNORMAL HIGH (ref 70–99)
Potassium: 3.8 mmol/L (ref 3.5–5.1)
Sodium: 130 mmol/L — ABNORMAL LOW (ref 135–145)

## 2022-04-18 LAB — CBC
HCT: 36.1 % (ref 36.0–46.0)
Hemoglobin: 11.6 g/dL — ABNORMAL LOW (ref 12.0–15.0)
MCH: 23.6 pg — ABNORMAL LOW (ref 26.0–34.0)
MCHC: 32.1 g/dL (ref 30.0–36.0)
MCV: 73.4 fL — ABNORMAL LOW (ref 80.0–100.0)
Platelets: 329 K/uL (ref 150–400)
RBC: 4.92 MIL/uL (ref 3.87–5.11)
RDW: 17.8 % — ABNORMAL HIGH (ref 11.5–15.5)
WBC: 9.5 K/uL (ref 4.0–10.5)
nRBC: 0 % (ref 0.0–0.2)

## 2022-04-18 LAB — TROPONIN I (HIGH SENSITIVITY): Troponin I (High Sensitivity): 12 ng/L

## 2022-04-18 NOTE — ED Triage Notes (Signed)
Pt states she has had chills, weakness, sweating, SHOB in sleep. Pt states her BP was elevated at home. Pt was seen for similar 10 days ago without any improvement since.

## 2022-04-18 NOTE — ED Provider Triage Note (Signed)
Emergency Medicine Provider Triage Evaluation Note  [name] , a 46 y.o. female  was evaluated in triage.  Pt complains of frustration with raggady body, not improving despite compliance with a ton of medicine.  Boyfriend checked BP, it was really high which prompted visit today with tingling in both arms, vomiting, sweating badly. With chest burning. No worsening lower ext swelling. With [facility], able to speak in complete sentences. No missed medications. Took pain medication today without relief.  Review of Systems  Positive: As above Negative: As above  Physical Exam  BP (!) 189/138 (BP Location: Left Arm)   Pulse (!) 102   Temp 98.2 F (36.8 C) (Oral)   Resp 18   Ht '4\' 11"'$  (1.499 m)   Wt 81.6 kg   LMP 04/08/2022   SpO2 98%   BMI 36.36 kg/m  Gen:   Awake, no distress   Resp:  Normal effort  MSK:   Moves extremities without difficulty  Other:    Medical Decision Making  Medically screening exam initiated at 5:10 PM.  Appropriate orders placed.  BARBARAJEAN KINZLER was informed that the remainder of the evaluation will be completed by another provider, this initial triage assessment does not replace that evaluation, and the importance of remaining in the ED until their evaluation is complete.     Tacy Learn, PA-C 04/18/22 1717

## 2022-04-19 DIAGNOSIS — I110 Hypertensive heart disease with heart failure: Secondary | ICD-10-CM | POA: Diagnosis not present

## 2022-04-19 LAB — DIGOXIN LEVEL: Digoxin Level: <0.2 ng/mL — ABNORMAL LOW (ref 0.8–2.0)

## 2022-04-19 LAB — URINALYSIS, ROUTINE W REFLEX MICROSCOPIC
Bacteria, UA: NONE SEEN
Bilirubin Urine: NEGATIVE
Glucose, UA: >=500 mg/dL — AB
Ketones, ur: 20 mg/dL — AB
Leukocytes,Ua: NEGATIVE
Nitrite: NEGATIVE
Protein, ur: >=300 mg/dL — AB
Specific Gravity, Urine: 1.043 — ABNORMAL HIGH (ref 1.005–1.030)
pH: 5 (ref 5.0–8.0)

## 2022-04-19 LAB — HEPATIC FUNCTION PANEL
ALT: 17 U/L (ref 0–44)
AST: 24 U/L (ref 15–41)
Albumin: 3.3 g/dL — ABNORMAL LOW (ref 3.5–5.0)
Alkaline Phosphatase: 134 U/L — ABNORMAL HIGH (ref 38–126)
Bilirubin, Direct: 0.5 mg/dL — ABNORMAL HIGH (ref 0.0–0.2)
Indirect Bilirubin: 2.3 mg/dL — ABNORMAL HIGH (ref 0.3–0.9)
Total Bilirubin: 2.8 mg/dL — ABNORMAL HIGH (ref 0.3–1.2)
Total Protein: 8.2 g/dL — ABNORMAL HIGH (ref 6.5–8.1)

## 2022-04-19 LAB — PROTIME-INR
INR: 1.5 — ABNORMAL HIGH (ref 0.8–1.2)
Prothrombin Time: 18.2 seconds — ABNORMAL HIGH (ref 11.4–15.2)

## 2022-04-19 LAB — TROPONIN I (HIGH SENSITIVITY): Troponin I (High Sensitivity): 12 ng/L (ref <18)

## 2022-04-19 LAB — CBG MONITORING, ED: Glucose-Capillary: 333 mg/dL — ABNORMAL HIGH (ref 70–99)

## 2022-04-19 LAB — BRAIN NATRIURETIC PEPTIDE: B Natriuretic Peptide: 633.4 pg/mL — ABNORMAL HIGH (ref 0.0–100.0)

## 2022-04-19 LAB — LIPASE, BLOOD: Lipase: 25 U/L (ref 11–51)

## 2022-04-19 MED ORDER — ONDANSETRON HCL 4 MG/2ML IJ SOLN: 4.0000 mg | Freq: Once | INTRAMUSCULAR | Status: DC

## 2022-04-19 MED ORDER — ONDANSETRON 4 MG PO TBDP
4.0000 mg | ORAL_TABLET | Freq: Once | ORAL | Status: AC
  Administered 2022-04-19: 4 mg via ORAL
  Filled 2022-04-19: qty 1

## 2022-04-19 MED ORDER — ALUM & MAG HYDROXIDE-SIMETH 200-200-20 MG/5ML PO SUSP
30.0000 mL | Freq: Once | ORAL | Status: DC
  Filled 2022-04-19: qty 30

## 2022-04-19 MED ORDER — CARVEDILOL 3.125 MG PO TABS
3.1250 mg | ORAL_TABLET | Freq: Once | ORAL | Status: DC
  Filled 2022-04-19: qty 1

## 2022-04-19 NOTE — Discharge Instructions (Addendum)
Your blood pressure and blood sugar were elevated today.  It is imperative that you take your medications as prescribed. Follow-up with your primary care doctor and cardiology for management of your chronic issues. Return to the ED for new or worsening symptoms.

## 2022-04-19 NOTE — ED Provider Notes (Signed)
[name]   CSN: [id number] Arrival date & time: 04/18/22  1638     History  Chief Complaint  Patient presents with   Weakness    [name] is a 46 y.o. female.  The history is provided by the patient and medical records.  Weakness  46 year old female with history of hypertension, anemia, A-fib on Coumadin, nonischemic cardiomyopathy, CHF, diabetes, presenting to the ED for various complaints.  Overall, she endorses frustration with her general health.  She is on a multitude of medications but continues to feel "poor" overall.  What prompted ED visit today was BP reading this morning was high and she felt very "sweaty".  She also reports some tingles down both of her arms but no frank chest pain.  States she has had some GI upset recently with nausea and vomiting but denies diarrhea.  She denies any fever or sick contacts.  States she missed her nighttime medications as she was in the ED waiting room, however otherwise reports compliance with her medications including her Coumadin.  She is due for INR check next week.  Home Medications Prior to Admission medications   Medication Sig Start Date End Date Taking? Authorizing Provider  Accu-Chek Softclix Lancets lancets Use as instructed 02/12/19   Caroline More, DO  acetaminophen (TYLENOL) 325 MG tablet Take 2 tablets (650 mg total) by mouth every 6 (six) hours as needed for mild pain (or Fever >/= 101). 02/27/22   Oswald Hillock, MD  albuterol (VENTOLIN HFA) 108 (90 Base) MCG/ACT inhaler Inhale 2 puffs into the lungs every 6 (six) hours as needed for wheezing or shortness of breath. 09/16/21 09/16/22  Lilland, Lorrin Goodell, DO  atorvastatin (LIPITOR) 40 MG tablet TAKE 1 TABLET BY MOUTH DAILY AT 6 PM. 04/01/20 08/27/22  Larey Dresser, MD  blood glucose meter kit and supplies KIT Dispense based on patient and insurance preference. Use up to four times daily as directed. (FOR ICD-9 250.00, 250.01).  09/27/21   Autry-Lott, Naaman Plummer, DO  Blood Glucose Monitoring Suppl (ACCU-CHEK AVIVA PLUS) w/Device KIT 1 application by Does not apply route QID. Check fasting sugars as well as throughout the day 02/12/19   Caroline More, DO  carvedilol (COREG) 3.125 MG tablet Take 1 tablet (3.125 mg total) by mouth 2 (two) times daily with a meal. 02/27/22   Oswald Hillock, MD  digoxin (LANOXIN) 0.125 MG tablet Take 1 tablet (0.125 mg total) by mouth daily. 11/08/21   Sharion Settler, DO  empagliflozin (JARDIANCE) 10 MG TABS tablet Take 1 tablet (10 mg total) by mouth daily. 11/08/21   Sharion Settler, DO  furosemide (LASIX) 40 MG tablet Take 1 tablet (40 mg total) by mouth every evening. 02/27/22   Oswald Hillock, MD  furosemide (LASIX) 80 MG tablet Take 1 tablet (80 mg total) by mouth every morning. 02/27/22   Oswald Hillock, MD  glucose blood (ACCU-CHEK AVIVA PLUS) test strip Check CBG 2 times per day 08/16/19   [name], DO  glucose blood (AGAMATRIX PRESTO TEST) test strip Use as instructed 10/23/17   Zenia Resides, MD  hydrOXYzine (ATARAX) 10 MG tablet Take 1 tablet (10 mg total) by mouth 3 (three) times daily as needed. Patient taking differently: Take 10 mg by mouth 3 (three) times daily as needed for anxiety. 10/22/21   Gladys Damme, MD  insulin aspart (NOVOLOG FLEXPEN) 100 UNIT/ML FlexPen Inject 12 Units into the skin daily before supper. 09/02/21   Hensel,  Jamal Collin, MD  insulin glargine (LANTUS) 100 UNIT/ML Solostar Pen Inject 36 Units into the skin every morning. 09/02/21   Zenia Resides, MD  Insulin Pen Needle (UNIFINE PENTIPS) 31G X 5 MM MISC USE AS DIRECTED 2 TIMES DAILY 09/02/21   Zenia Resides, MD  Insulin Syringe-Needle U-100 (INSULIN SYRINGE .3CC/31GX5/16") 31G X 5/16" 0.3 ML MISC 15 Units by Does not apply route 2 (two) times daily. 09/05/20   Rai, Vernelle Emerald, MD  Lancets Misc. (ACCU-CHEK SOFTCLIX LANCET DEV) KIT 1 application by Does not apply route 2 (two) times daily. 08/16/19   [name], DO  losartan (COZAAR) 25 MG tablet Take 1 tablet (25 mg total) by mouth daily. 02/28/22   Oswald Hillock, MD  metoCLOPramide (REGLAN) 5 MG tablet Take 1 tablet (5 mg total) by mouth every 8 (eight) hours as needed for nausea. 11/05/21 11/05/22  Erskine Emery, MD  ondansetron (ZOFRAN) 4 MG tablet Take 1 tablet (4 mg total) by mouth every 8 (eight) hours as needed for nausea or vomiting. 02/28/22   Oswald Hillock, MD  ondansetron (ZOFRAN-ODT) 4 MG disintegrating tablet Take 1 tablet (4 mg total) by mouth every 8 (eight) hours as needed for nausea or vomiting. 04/09/22   Long, Wonda Olds, MD  pantoprazole (PROTONIX) 40 MG tablet Take 1 tablet (40 mg total) by mouth daily. 02/27/22   Oswald Hillock, MD  spironolactone (ALDACTONE) 25 MG tablet Take 0.5 tablets (12.5 mg total) by mouth daily. 11/08/21   Sharion Settler, DO  venlafaxine XR (EFFEXOR XR) 37.5 MG 24 hr capsule Take 1 capsule (37.5 mg total) by mouth daily with breakfast. 02/10/22   Lind Covert, MD  warfarin (COUMADIN) 7.5 MG tablet Take 1-2 tablets daily as directed. 03/11/22   Janina Mayo, MD      Allergies    Shellfish allergy, Metformin and related, Trulicity [dulaglutide], and Ibuprofen    Review of Systems   Review of Systems  Neurological:  Positive for weakness.  All other systems reviewed and are negative.   Physical Exam Updated Vital Signs BP (!) 179/127   Pulse 97   Temp 97.6 F (36.4 C) (Oral)   Resp 20   Ht '4\' 11"'  (1.499 m)   Wt 81.6 kg   LMP 04/08/2022   SpO2 100%   BMI 36.36 kg/m   Physical Exam Vitals and nursing [name] reviewed.  Constitutional:      Appearance: She is well-developed.     Comments: Sitting on all 4's, leaned on back of the head of stretcher  HENT:     Head: Normocephalic and atraumatic.  Eyes:     Conjunctiva/sclera: Conjunctivae normal.     Pupils: Pupils are equal, round, and reactive to light.  Cardiovascular:     Rate and Rhythm: Normal rate and regular rhythm.     Heart  sounds: Normal heart sounds.  Pulmonary:     Effort: Pulmonary effort is normal. No respiratory distress.     Breath sounds: Normal breath sounds. No rhonchi.  Abdominal:     General: Bowel sounds are normal.     Palpations: Abdomen is soft.     Tenderness: There is no abdominal tenderness. There is no rebound.  Musculoskeletal:        General: Normal range of motion.     Cervical back: Normal range of motion.  Skin:    General: Skin is warm and dry.  Neurological:     Mental Status: She is  alert and oriented to person, place, and time.     ED Results / Procedures / Treatments   Labs (all labs ordered are listed, but only abnormal results are displayed) Labs Reviewed  BASIC METABOLIC PANEL - Abnormal; Notable for the following components:      Result Value   Sodium 130 (*)    CO2 18 (*)    Glucose, Bld 430 (*)    Creatinine, Ser 1.03 (*)    Calcium 8.8 (*)    All other components within normal limits  CBC - Abnormal; Notable for the following components:   Hemoglobin 11.6 (*)    MCV 73.4 (*)    MCH 23.6 (*)    RDW 17.8 (*)    All other components within normal limits  PROTIME-INR - Abnormal; Notable for the following components:   Prothrombin Time 18.2 (*)    INR 1.5 (*)    All other components within normal limits  DIGOXIN LEVEL - Abnormal; Notable for the following components:   Digoxin Level <0.2 (*)    All other components within normal limits  BRAIN NATRIURETIC PEPTIDE - Abnormal; Notable for the following components:   B Natriuretic Peptide 633.4 (*)    All other components within normal limits  HEPATIC FUNCTION PANEL - Abnormal; Notable for the following components:   Total Protein 8.2 (*)    Albumin 3.3 (*)    Alkaline Phosphatase 134 (*)    Total Bilirubin 2.8 (*)    Bilirubin, Direct 0.5 (*)    Indirect Bilirubin 2.3 (*)    All other components within normal limits  CBG MONITORING, ED - Abnormal; Notable for the following components:    Glucose-Capillary 333 (*)    All other components within normal limits  LIPASE, BLOOD  URINALYSIS, ROUTINE W REFLEX MICROSCOPIC  I-STAT BETA HCG BLOOD, ED ([facility], [facility], [facility] ONLY)  TROPONIN I (HIGH SENSITIVITY)  TROPONIN I (HIGH SENSITIVITY)    EKG EKG Interpretation  Date/Time:  Monday April 18 2022 17:03:37 EDT Ventricular Rate:  98 PR Interval:  138 QRS Duration: 94 QT Interval:  359 QTC Calculation: 459 R Axis:   60 Text Interpretation: Sinus rhythm Probable left atrial enlargement Borderline T abnormalities, diffuse leads No significant change since last tracing Confirmed by Dorie Rank [phone]) on 04/18/2022 5:10:54 PM  Radiology DG Chest Port 1 View  Result Date: 04/18/2022 CLINICAL DATA:  Chest pain EXAM: PORTABLE CHEST 1 VIEW COMPARISON:  04/08/2022 FINDINGS: Cardiomegaly. Both lungs are clear. The visualized skeletal structures are unremarkable. IMPRESSION: Cardiomegaly without acute abnormality of the lungs in AP portable projection. Electronically Signed   By: Delanna Ahmadi M.D.   On: 04/18/2022 17:35    Procedures Procedures    Medications Ordered in ED Medications  alum & mag hydroxide-simeth (MAALOX/MYLANTA) 200-200-20 MG/5ML suspension 30 mL (has no administration in time range)  carvedilol (COREG) tablet 3.125 mg (has no administration in time range)  ondansetron (ZOFRAN-ODT) disintegrating tablet 4 mg (4 mg Oral Given 04/19/22 0324)    ED Course/ Medical Decision Making/ A&P                           Medical Decision Making Amount and/or Complexity of Data Reviewed Labs: ordered. Radiology: ordered and independent interpretation performed. ECG/medicine tests: ordered and independent interpretation performed.  Risk OTC drugs. Prescription drug management.   46 year old female presenting to the ED with multitude of complaints, most of which are chronic in nature.  Reports  she is "frustrated" that she is on numerous medications and is still having symptoms.   She reports she has been compliant with her medications, however missed nighttime doses due to prolonged wait time.  She is afebrile, nontoxic.  She is sitting on all fours in the bed as she reports this is "more comfortable".  She is hypertensive but is not tachycardic nor hypoxic.  She does not appear to be in any acute distress.  Work-up thus far reviewed-- no leukocytosis.  She is hyperglycemic without findings of DKA (bicarb mildly low but anion gap normal).  Trop negative x2.  CXR without vascular congestion or frank edema.  Patient reports nausea/vomiting and heartburn type symptoms currently.  Will add on lipase and hepatic panel.  She was also recently switched back to coumadin so will check INR and digoxin level.  Will give GI cocktail and night time coreg dose.  Hepatic panel with some mild irregularities, however similar to prior when compared.  Lipase WNL.  INR 1.5.  digoxin level <0.2.  UA without signs of infection.  Patient refused to take coreg while here in the ED-- it does appear based on chart review she does have a long history of medication non-compliance.  At this point, majority of her complaints are chronic.  She does not have any evidence of end organ damage at this time.  Feel she is stable for discharge.  She will need to follow-up closely with her primary care doctor and/or cardiologist for ongoing management of her multiple medical issues.  Return here for new concerns.  Final Clinical Impression(s) / ED Diagnoses Final diagnoses:  Essential hypertension  Hyperglycemia    Rx / DC Orders ED Discharge Orders     None         Larene Pickett, PA-C 04/19/22 3748    Orpah Greek, MD 04/19/22 [phone]

## 2022-04-25 ENCOUNTER — Telehealth: Payer: Self-pay | Admitting: Internal Medicine

## 2022-04-25 ENCOUNTER — Other Ambulatory Visit: Payer: Self-pay | Admitting: Family Medicine

## 2022-04-25 ENCOUNTER — Ambulatory Visit: Payer: Medicare Other

## 2022-04-25 ENCOUNTER — Other Ambulatory Visit (HOSPITAL_COMMUNITY): Payer: Self-pay

## 2022-04-25 DIAGNOSIS — I480 Paroxysmal atrial fibrillation: Secondary | ICD-10-CM

## 2022-04-25 MED ORDER — WARFARIN SODIUM 7.5 MG PO TABS
7.5000 mg | ORAL_TABLET | Freq: Every day | ORAL | 0 refills | Status: DC
  Filled 2022-04-25: qty 15, 8d supply, fill #0

## 2022-04-25 NOTE — Telephone Encounter (Signed)
Prescription refill request received for warfarin Lov: 03/11/22 ([facility])  Next INR check: Overdue - pt has scheduled appt on 05/02/22 Warfarin tablet strength:  7.'5mg'$   Appropriate dose and refill sent to requested pharmacy to last until upcoming appt with [facility]

## 2022-04-25 NOTE — Telephone Encounter (Signed)
*  STAT* If patient is at the pharmacy, call can be transferred to refill team.   1. Which medications need to be refilled? (please list name of each medication and dose if known) warfarin (COUMADIN) 7.5 MG tablet  2. Which pharmacy/location (including street and city if local pharmacy) is medication to be sent to? [place]  3. Do they need a 30 day or 90 day supply? Enough to get her to her appt on 10/9

## 2022-04-26 ENCOUNTER — Telehealth: Payer: Self-pay

## 2022-04-26 ENCOUNTER — Other Ambulatory Visit (HOSPITAL_COMMUNITY): Payer: Self-pay

## 2022-04-26 MED ORDER — TRAZODONE HCL 50 MG PO TABS
50.0000 mg | ORAL_TABLET | Freq: Every day | ORAL | 0 refills | Status: DC
  Filled 2022-04-26: qty 60, 60d supply, fill #0

## 2022-04-26 NOTE — Telephone Encounter (Signed)
Patient calls nurse line in regards to Trazodone.   Patient is upset this medication was not refilled.  Advised patient it appears the medication was discontinued at July visit due to starting Effexor.   Patient reports she only took Effexor "a few times" and stopped due to adverse side effects such as sweating.   Patient scheduled with PCP to discuss on 10/16 to discuss further.   Patient request a small refill in the meantime.   Will forward to PCP.

## 2022-05-02 ENCOUNTER — Ambulatory Visit: Payer: Medicare Other | Attending: Cardiology | Admitting: Student

## 2022-05-02 ENCOUNTER — Other Ambulatory Visit (HOSPITAL_COMMUNITY): Payer: Self-pay

## 2022-05-02 ENCOUNTER — Other Ambulatory Visit: Payer: Self-pay

## 2022-05-02 DIAGNOSIS — I513 Intracardiac thrombosis, not elsewhere classified: Secondary | ICD-10-CM | POA: Insufficient documentation

## 2022-05-02 DIAGNOSIS — Z5181 Encounter for therapeutic drug level monitoring: Secondary | ICD-10-CM | POA: Diagnosis not present

## 2022-05-02 DIAGNOSIS — I480 Paroxysmal atrial fibrillation: Secondary | ICD-10-CM | POA: Insufficient documentation

## 2022-05-02 LAB — POCT INR: INR: 2 (ref 2.0–3.0)

## 2022-05-02 MED ORDER — WARFARIN SODIUM 7.5 MG PO TABS
7.5000 mg | ORAL_TABLET | Freq: Every day | ORAL | 0 refills | Status: DC
  Filled 2022-05-02: qty 40, 30d supply, fill #0

## 2022-05-02 MED ORDER — WARFARIN SODIUM 7.5 MG PO TABS
7.5000 mg | ORAL_TABLET | Freq: Every day | ORAL | 0 refills | Status: DC
  Filled 2022-05-02: qty 40, 20d supply, fill #0

## 2022-05-02 NOTE — Patient Instructions (Signed)
Today take 1.5 tablet of warfarin then continue taking Warfarin 1.5 tablets daily except 1 tablet on Mondays. Recheck INR in 2 weeks. [facility] [phone].

## 2022-05-02 NOTE — Telephone Encounter (Signed)
Prescription refill request received for warfarin Lov: 03/11/22 ([facility])  Next INR check: 05/17/22 Warfarin tablet strength: 7.'5mg'$   Appropriate dose and refill sent to requested pharmacy.

## 2022-05-05 ENCOUNTER — Emergency Department (HOSPITAL_COMMUNITY): Payer: Medicare Other

## 2022-05-05 ENCOUNTER — Emergency Department (HOSPITAL_COMMUNITY)
Admission: EM | Admit: 2022-05-05 | Discharge: 2022-05-06 | Disposition: A | Discharge status: Home / Self Care | Payer: Medicare Other | Attending: Emergency Medicine | Admitting: Emergency Medicine

## 2022-05-05 ENCOUNTER — Telehealth: Payer: Self-pay

## 2022-05-05 ENCOUNTER — Telehealth: Payer: Self-pay | Admitting: Family Medicine

## 2022-05-05 ENCOUNTER — Other Ambulatory Visit: Payer: Self-pay

## 2022-05-05 ENCOUNTER — Encounter (HOSPITAL_COMMUNITY): Payer: Self-pay | Admitting: Emergency Medicine

## 2022-05-05 DIAGNOSIS — E119 Type 2 diabetes mellitus without complications: Secondary | ICD-10-CM | POA: Insufficient documentation

## 2022-05-05 DIAGNOSIS — Z7984 Long term (current) use of oral hypoglycemic drugs: Secondary | ICD-10-CM | POA: Insufficient documentation

## 2022-05-05 DIAGNOSIS — R079 Chest pain, unspecified: Secondary | ICD-10-CM | POA: Insufficient documentation

## 2022-05-05 DIAGNOSIS — Z79899 Other long term (current) drug therapy: Secondary | ICD-10-CM | POA: Diagnosis not present

## 2022-05-05 DIAGNOSIS — I4891 Unspecified atrial fibrillation: Secondary | ICD-10-CM | POA: Diagnosis not present

## 2022-05-05 DIAGNOSIS — I110 Hypertensive heart disease with heart failure: Secondary | ICD-10-CM | POA: Diagnosis not present

## 2022-05-05 DIAGNOSIS — I509 Heart failure, unspecified: Secondary | ICD-10-CM | POA: Insufficient documentation

## 2022-05-05 DIAGNOSIS — I5020 Unspecified systolic (congestive) heart failure: Secondary | ICD-10-CM | POA: Diagnosis not present

## 2022-05-05 DIAGNOSIS — Z794 Long term (current) use of insulin: Secondary | ICD-10-CM | POA: Insufficient documentation

## 2022-05-05 DIAGNOSIS — R600 Localized edema: Secondary | ICD-10-CM | POA: Insufficient documentation

## 2022-05-05 DIAGNOSIS — M7989 Other specified soft tissue disorders: Secondary | ICD-10-CM | POA: Insufficient documentation

## 2022-05-05 DIAGNOSIS — R0600 Dyspnea, unspecified: Secondary | ICD-10-CM | POA: Diagnosis not present

## 2022-05-05 DIAGNOSIS — M791 Myalgia, unspecified site: Secondary | ICD-10-CM | POA: Insufficient documentation

## 2022-05-05 DIAGNOSIS — Z7901 Long term (current) use of anticoagulants: Secondary | ICD-10-CM | POA: Diagnosis not present

## 2022-05-05 DIAGNOSIS — R0602 Shortness of breath: Secondary | ICD-10-CM | POA: Insufficient documentation

## 2022-05-05 DIAGNOSIS — R112 Nausea with vomiting, unspecified: Secondary | ICD-10-CM | POA: Insufficient documentation

## 2022-05-05 LAB — BASIC METABOLIC PANEL
Anion gap: 10 (ref 5–15)
BUN: 17 mg/dL (ref 6–20)
CO2: 20 mmol/L — ABNORMAL LOW (ref 22–32)
Calcium: 8.7 mg/dL — ABNORMAL LOW (ref 8.9–10.3)
Chloride: 102 mmol/L (ref 98–111)
Creatinine, Ser: 1.01 mg/dL — ABNORMAL HIGH (ref 0.44–1.00)
GFR, Estimated: >60 mL/min (ref >60)
Glucose, Bld: 310 mg/dL — ABNORMAL HIGH (ref 70–99)
Potassium: 3.9 mmol/L (ref 3.5–5.1)
Sodium: 132 mmol/L — ABNORMAL LOW (ref 135–145)

## 2022-05-05 LAB — CBC
HCT: 33.1 % — ABNORMAL LOW (ref 36.0–46.0)
Hemoglobin: 10.7 g/dL — ABNORMAL LOW (ref 12.0–15.0)
MCH: 23.2 pg — ABNORMAL LOW (ref 26.0–34.0)
MCHC: 32.3 g/dL (ref 30.0–36.0)
MCV: 71.6 fL — ABNORMAL LOW (ref 80.0–100.0)
Platelets: 315 10*3/uL (ref 150–400)
RBC: 4.62 MIL/uL (ref 3.87–5.11)
RDW: 18.1 % — ABNORMAL HIGH (ref 11.5–15.5)
WBC: 9.3 10*3/uL (ref 4.0–10.5)
nRBC: 0.2 % (ref 0.0–0.2)

## 2022-05-05 LAB — BRAIN NATRIURETIC PEPTIDE: B Natriuretic Peptide: 782.2 pg/mL — ABNORMAL HIGH (ref 0.0–100.0)

## 2022-05-05 LAB — TROPONIN I (HIGH SENSITIVITY): Troponin I (High Sensitivity): 33 ng/L — ABNORMAL HIGH (ref <18)

## 2022-05-05 NOTE — Telephone Encounter (Signed)
Patient calls nurse line requesting to speak with provider regarding difficulty breathing and chest pain. Onset since MI on 02/22/22.She does feel like her symptoms have worsened recently. She states that it is worse with exertion.   Patient has follow up with PCP on Monday, 10/16.  Advised that patient be evaluated in the ED due to worsening chest pain and SHOB. Patient verbalizes understanding.   Talbot Grumbling, RN

## 2022-05-05 NOTE — ED Triage Notes (Signed)
Patient arrived with EMS from home reports pain across chest for several weeks worse today with SOB , she took 4 ASA tabs prior to arrival , no emesis or diaphoresis .

## 2022-05-05 NOTE — Telephone Encounter (Signed)
Subjective: Saw patient had not been seen in emergency department within [facility] despite previous visits in past 2 months after phone call to clinic today recommending ED evaluation for shortness of breath and chest pain. Called patient to check in. Patient reports worsening shortness of breath and chest pain since having NSTEMI on 02/22/22. Patient reports associated nausea vomiting and pain with any exertion, that is pressure like and sharp. States she "feels like she might be having another heart attack."   Objective: Patient able to carry conversation with out pauses to catch breath.  Assessment and Plan: Chest pain, dyspnea on exertion Unable to distinguish based on phone history whether or not their are acute change to chest pain or SOB. Cannot rule out repeat NSTEMI or new STEMI. Instructed patient to call 911 for transport to ED. Patient verbalized understanding, and stated she would call immediately after ending our call.

## 2022-05-05 NOTE — ED Provider Triage Note (Signed)
Emergency Medicine Provider Triage Evaluation Note  [name] , a 46 y.o. female  was evaluated in triage.  Pt complains of increasing chest discomfort over the last week and new shortness of breath starting today.  Described as sharp, constant, and worsened with movement.  Took 4 aspirin before arrival at the ED.  States "I do not want to be here, but this hurts".  Denies fever, chills, recent upper respiratory symptoms.  Hx of DMT2, paroxysmal A-fib, CHF, nonischemic cardiomyopathy, and biventricular failure.  Review of Systems  Positive:  Negative: See above  Physical Exam  LMP 04/08/2022  Gen:   Awake, no distress   Resp:  Normal effort, communicates without difficulty, equal chest rise MSK:   Moves extremities without difficulty  Other:  Sitting in wheelchair.  Chest mildly TTP.  Abdomen soft, nontender.  1+ to 2+ pitting edema and tenderness of lower extremities to the ankle.  Medical Decision Making  Medically screening exam initiated at 9:37 PM.  Appropriate orders placed.  [name] was informed that the remainder of the evaluation will be completed by another provider, this initial triage assessment does not replace that evaluation, and the importance of remaining in the ED until their evaluation is complete.     Prince Rome, PA-C 46/96/29 [id number]

## 2022-05-06 ENCOUNTER — Emergency Department (HOSPITAL_COMMUNITY): Payer: Medicare Other

## 2022-05-06 DIAGNOSIS — R079 Chest pain, unspecified: Secondary | ICD-10-CM

## 2022-05-06 DIAGNOSIS — I5020 Unspecified systolic (congestive) heart failure: Secondary | ICD-10-CM

## 2022-05-06 LAB — PROTIME-INR
INR: 1.9 — ABNORMAL HIGH (ref 0.8–1.2)
Prothrombin Time: 21.2 seconds — ABNORMAL HIGH (ref 11.4–15.2)

## 2022-05-06 LAB — TROPONIN I (HIGH SENSITIVITY): Troponin I (High Sensitivity): 27 ng/L — ABNORMAL HIGH (ref <18)

## 2022-05-06 LAB — APTT: aPTT: 29 seconds (ref 24–36)

## 2022-05-06 MED ORDER — MORPHINE SULFATE (PF) 4 MG/ML IV SOLN
4.0000 mg | Freq: Once | INTRAVENOUS | Status: AC
  Administered 2022-05-06: 4 mg via INTRAVENOUS
  Filled 2022-05-06: qty 1

## 2022-05-06 MED ORDER — IOHEXOL 350 MG/ML SOLN
75.0000 mL | Freq: Once | INTRAVENOUS | Status: AC | PRN
  Administered 2022-05-06: 75 mL via INTRAVENOUS

## 2022-05-06 MED ORDER — FUROSEMIDE 10 MG/ML IJ SOLN
40.0000 mg | Freq: Once | INTRAMUSCULAR | Status: AC
  Administered 2022-05-06: 40 mg via INTRAVENOUS
  Filled 2022-05-06: qty 4

## 2022-05-06 MED ORDER — LABETALOL HCL 5 MG/ML IV SOLN
10.0000 mg | Freq: Once | INTRAVENOUS | Status: AC
  Administered 2022-05-06: 10 mg via INTRAVENOUS
  Filled 2022-05-06: qty 4

## 2022-05-06 MED ORDER — ONDANSETRON HCL 4 MG/2ML IJ SOLN
4.0000 mg | Freq: Once | INTRAMUSCULAR | Status: AC
  Administered 2022-05-06: 4 mg via INTRAVENOUS
  Filled 2022-05-06: qty 2

## 2022-05-06 MED ORDER — LOSARTAN POTASSIUM 50 MG PO TABS
25.0000 mg | ORAL_TABLET | Freq: Once | ORAL | Status: AC
  Administered 2022-05-06: 25 mg via ORAL
  Filled 2022-05-06: qty 1

## 2022-05-06 MED ORDER — ALUM & MAG HYDROXIDE-SIMETH 200-200-20 MG/5ML PO SUSP
30.0000 mL | Freq: Once | ORAL | Status: AC
  Administered 2022-05-06: 30 mL via ORAL
  Filled 2022-05-06: qty 30

## 2022-05-06 NOTE — Consult Note (Signed)
Cardiology Consultation   Patient ID: [name] MRN: [id number]; DOB: 1975-10-16  Admit date: 05/05/2022 Date of Consult: 05/06/2022  PCP:  Darci Current, [facility]  Electrophysiologist:  Will Meredith Leeds, MD  {      Patient Profile:   [name] is a 46 y.o. female with a hx of CHF  who is being seen 05/06/2022 for the evaluation of chest pain at the request of Dr Nechama Guard.  History of Present Illness:   [name]  is a 46 yo with hx of HFrEF (NICM), PAF, T2DM, HTN, anemia.   CHf wsa diagnosed in 2020  LVEF 20 to 25%  SHe was found to be in  afib at the time     Echo 3 month later unchanged, therefore  R/L heart cath done in Sept 2020 and  showed no obstructive CAD .  Patient treated with IV lasix   Placed on coumadin for LV thrombus   Patient was admitted to the hospital in 08/2021 for evaluation of CHF and chest pain. Echo from 09/18/21 showed EF 20%, grade III diastolic dysfunction, elevated LVEDP, severely reduced RV systolic function, moderately elevated PA systolic pressure, mild MR. Underwent R/L heart cath again  on 09/20/21 that showed mild, nonobstructive CAD, elevated filling pressures, preserved cardiac output, pulmonary venous htn. At that time, Dr. Aundra Dubin believed that her cardiomyopathy could be caused by poorly controlled DM or HTN. Patient underwent Cardiac MRI  on 09/20/21.showed mildly dilated LV with EF 23%, diffuse hypokinesis with inferior akinesis, moderate RV systolic dysfunction with EF 32%, no definitive evidence of prior MI, infiltrative disease, myocarditis. LV thrombus was not seen on cMRI, continued on coumadin for prevention. Later, patient was transitioned to Xarelto due to poor compliance with coumadin and subtherapeutic INRs.  Patient was dismissed from CHF clinic due to mistreatment of staff. She's had recurrent admissions felt due to medication compliance.   She has been seen by Beckie Busing in cardiology  Echo in Aug  showed LV thrombus and LVEF <20%  RVEF moderately reduced.     Continued anticoagulation   The pt presented to ER today because BP very high and she felt sweaty.   SHe has had diffuse aches particularly in low back.    SHe did have some chest pressure with tingling in hands     Overall doesn't feel good     She claims she is compliant with meds     Past Medical History:  Diagnosis Date   Abnormal Pap smear    Acute on chronic combined systolic and diastolic CHF (congestive heart failure) ([place]) 02/19/2019   Acute on chronic congestive heart failure (HCC)    Acute on chronic congestive heart failure (HCC)    Acute on chronic diastolic CHF (congestive heart failure) ([place]) 10/19/2021   Anemia    Asthma    Atrial fibrillation ([place])    history of paroxysmal atrial fibrillation   CHF exacerbation ([place]) 10/17/2021   Diabetes mellitus    DKA, type 2 ([place]) 02/06/2019   Elevated troponin 09/30/2019   Heart failure with reduced ejection fraction ([place])    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  Consider chronic etiology given continued fertility issues   Hypertension    Hypertensive emergency 09/30/2019   Major depressive disorder 02/08/2012   Reports that her infertility is a major cause of her depression Reports Prozac has  worked in the past for her.     Migraines    Morbid obesity ([place]) 02/06/2019   Nonischemic cardiomyopathy ([place]) 09/22/2021   Obstructive sleep apnea 06/30/2015   Polycystic ovarian disease     Past Surgical History:  Procedure Laterality Date   BIOPSY  11/05/2021   Procedure: BIOPSY;  Surgeon: Daryel November, MD;  Location: [facility] ENDOSCOPY;  Service: Gastroenterology;;   CERVIX LESION DESTRUCTION     CESAREAN SECTION     ESOPHAGOGASTRODUODENOSCOPY (EGD) WITH PROPOFOL N/A 11/05/2021   Procedure: ESOPHAGOGASTRODUODENOSCOPY (EGD) WITH PROPOFOL;  Surgeon: Daryel November, MD;  Location: [facility];   Service: Gastroenterology;  Laterality: N/A;   RIGHT/LEFT HEART CATH AND CORONARY ANGIOGRAPHY N/A 03/29/2019   Procedure: RIGHT/LEFT HEART CATH AND CORONARY ANGIOGRAPHY;  Surgeon: Larey Dresser, MD;  Location: [facility] CV LAB;  Service: Cardiovascular;  Laterality: N/A;   RIGHT/LEFT HEART CATH AND CORONARY ANGIOGRAPHY N/A 09/20/2021   Procedure: RIGHT/LEFT HEART CATH AND CORONARY ANGIOGRAPHY;  Surgeon: Larey Dresser, MD;  Location: [place] [facility];  Service: Cardiovascular;  Laterality: N/A;     Home Medications:  Prior to Admission medications   Medication Sig Start Date End Date Taking? Authorizing Provider  acetaminophen (TYLENOL) 325 MG tablet Take 2 tablets (650 mg total) by mouth every 6 (six) hours as needed for mild pain (or Fever >/= 101). 02/27/22  Yes Lama, Marge Duncans, MD  atorvastatin (LIPITOR) 40 MG tablet TAKE 1 TABLET BY MOUTH DAILY AT 6 PM. Patient taking differently: Take 40 mg by mouth daily. 04/01/20 08/27/22 Yes Larey Dresser, MD  carvedilol (COREG) 3.125 MG tablet Take 1 tablet (3.125 mg total) by mouth 2 (two) times daily with a meal. 02/27/22  Yes Darrick Meigs, Marge Duncans, MD  digoxin (LANOXIN) 0.125 MG tablet Take 1 tablet (0.125 mg total) by mouth daily. 11/08/21  Yes Sharion Settler, DO  empagliflozin (JARDIANCE) 10 MG TABS tablet Take 1 tablet (10 mg total) by mouth daily. 11/08/21  Yes Sharion Settler, DO  furosemide (LASIX) 80 MG tablet Take 1 tablet (80 mg total) by mouth every morning. 02/27/22  Yes Oswald Hillock, MD  hydrOXYzine (ATARAX) 10 MG tablet Take 1 tablet (10 mg total) by mouth 3 (three) times daily as needed. Patient taking differently: Take 10 mg by mouth 3 (three) times daily as needed for anxiety. 10/22/21  Yes Gladys Damme, MD  insulin aspart (NOVOLOG FLEXPEN) 100 UNIT/ML FlexPen Inject 12 Units into the skin daily before supper. 09/02/21  Yes Hensel, Jamal Collin, MD  insulin glargine (LANTUS) 100 UNIT/ML Solostar Pen Inject 36 Units into the skin every  morning. 09/02/21  Yes Hensel, Jamal Collin, MD  losartan (COZAAR) 25 MG tablet Take 1 tablet (25 mg total) by mouth daily. 02/28/22  Yes Oswald Hillock, MD  spironolactone (ALDACTONE) 25 MG tablet Take 0.5 tablets (12.5 mg total) by mouth daily. 11/08/21  Yes Sharion Settler, DO  traZODone (DESYREL) 50 MG tablet Take 1 tablet (50 mg total) by mouth at bedtime. 04/26/22  Yes Darci Current, DO  Accu-Chek Softclix Lancets lancets Use as instructed 02/12/19   Caroline More, DO  Insulin Pen Needle (UNIFINE PENTIPS) 31G X 5 MM MISC USE AS DIRECTED 2 TIMES DAILY 09/02/21   Zenia Resides, MD  ondansetron (ZOFRAN) 4 MG tablet Take 1 tablet (4 mg total) by mouth every 8 (eight) hours as needed for nausea or vomiting. Patient not taking: Reported on 05/06/2022 02/28/22   Oswald Hillock, MD  pantoprazole (  PROTONIX) 40 MG tablet Take 1 tablet (40 mg total) by mouth daily. Patient not taking: Reported on 05/06/2022 02/27/22   Oswald Hillock, MD  warfarin (COUMADIN) 7.5 MG tablet Take 1-2 tablets (7.5-15 mg total) by mouth daily as directed by Coumadin Clinic Patient not taking: Reported on 05/06/2022 05/02/22   Janina Mayo, MD    Inpatient Medications: Scheduled Meds:  Continuous Infusions:  PRN Meds:   Allergies:    Allergies  Allergen Reactions   Shellfish Allergy Swelling    Airway involvement including EMS - Has Epi-Pen   Metformin And Related Diarrhea   Trulicity [Dulaglutide] Nausea And Vomiting and Other (See Comments)    Multiple episodes of vomiting over multiple days   Nsaids Other (See Comments)    Per PCP interferes with daily meds (heart failure)   Advil [Ibuprofen] Other (See Comments)    Per PCP interferes with daily meds (heart failure)    Social History:   Social History   Socioeconomic History   Marital status: Single    Spouse name: Not on file   Number of children: Not on file   Years of education: Not on file   Highest education level: Not on file  Occupational History    Not on file  Tobacco Use   Smoking status: Never   Smokeless tobacco: Never   Tobacco comments:    Never smoke 10/22/21  Vaping Use   Vaping Use: Never used  Substance and Sexual Activity   Alcohol use: No    Alcohol/week: 0.0 standard drinks of alcohol   Drug use: No   Sexual activity: Yes  Other Topics Concern   Not on file  Social History Narrative   Not on file   Social Determinants of Health   Financial Resource Strain: High Risk (10/17/2019)   Overall Financial Resource Strain (CARDIA)    Difficulty of Paying Living Expenses: Hard  Food Insecurity: No Food Insecurity (08/19/2020)   Hunger Vital Sign    Worried About Running Out of Food in the Last Year: Never true    Ran Out of Food in the Last Year: Never true  Transportation Needs: Unmet Transportation Needs (10/22/2021)   PRAPARE - Transportation    Lack of Transportation (Medical): Yes    Lack of Transportation (Non-Medical): Yes  Physical Activity: Insufficiently Active (08/19/2020)   Exercise Vital Sign    Days of Exercise per Week: 7 days    Minutes of Exercise per Session: 20 min  Stress: Stress Concern Present (08/19/2020)   [place]    Feeling of Stress : Very much  Social Connections: Not on file  Intimate Partner Violence: Not At Risk (08/19/2020)   Humiliation, Afraid, Rape, and Kick questionnaire    Fear of Current or Ex-Partner: No    Emotionally Abused: No    Physically Abused: No    Sexually Abused: No    Family History:    Family History  Problem Relation Age of Onset   Diabetes Mother    Hypertension Mother    Hypertension Father    Diabetes Father      ROS:  Please see the history of present illness.   All other ROS reviewed and negative.     Physical Exam/Data:   Vitals:   05/06/22 1000 05/06/22 1115 05/06/22 1118 05/06/22 1130  BP: (!) 169/122 (!) 161/122 (!) 160/120 (!) 155/125  Pulse: 97 97 97 97  Resp: 20  (!) 26 (!)  26 (!) 22  Temp:   98.9 F (37.2 C)   TempSrc:   Axillary   SpO2: 99% 100% 100% 100%   No intake or output data in the 24 hours ending 05/06/22 1216    04/18/2022    5:06 PM 03/11/2022    8:08 AM 02/28/2022    5:00 AM  Last 3 Weights  Weight (lbs) 180 lb 178 lb 6.4 oz 177 lb 14.6 oz  Weight (kg) 81.647 kg 80.922 kg 80.7 kg     There is no height or weight on file to calculate BMI.  General:Morbidly obese 46 yo in no acute distress HEENT: normal Neck: Neck is full Vascular: No carotid bruits; Distal pulses 2+ bilaterally Cardiac:  RRR  No murmurs  NO S3     Chest   Tender to palpation   Brings on pain   Lungs:  clear to auscultation bilaterally, no wheezing, rhonchi or rales  Abd: soft, no masses  Diffusely tender   NO rebound    Ext: no LE  edema Musculoskeletal:  No deformities, BUE and BLE strength normal and equal Skin: warm and dry  Neuro:  CNs 2-12 intact, no focal abnormalities noted Psych:  Normal affect   EKG:  The EKG was personally reviewed and demonstrates:  ST 103    Q waves I/AVL   Posslbe armlead reversal   Telemetry:  Telemetry was personally reviewed and demonstrates:  SR  Relevant CV Studies:  Echo   02/22/22  LV thrombus present at apex, 2.2 x 1.4 cm with some mobility. Left ventricular ejection fraction, by estimation, is <20%. The left ventricle has severely decreased function. The left ventricle demonstrates global hypokinesis. The left ventricular internal cavity size was mildly dilated. Left ventricular diastolic parameters are consistent with Grade III diastolic dysfunction (restrictive). 1. Right ventricular systolic function is moderately reduced. The right ventricular size is normal. There is mildly elevated pulmonary artery systolic pressure. The estimated right ventricular systolic pressure is 52.8 mmHg. 2. 3. Left atrial size was mildly dilated. The mitral valve is normal in structure. Mild mitral valve regurgitation. No evidence  of mitral stenosis. 4. 5. Tricuspid valve regurgitation is mild to moderate. The aortic valve is tricuspid. Aortic valve regurgitation is not visualized. No aortic stenosis is present. 6. The inferior vena cava is normal in size with <50% respiratory variability, suggesting right atrial pressure of 8 mmHg.  MRI  09/20/21   IMPRESSION: 1. Mildly dilated LV with LV EF 23%, diffuse hypokinesis with inferior akinesis.   2.  Normal RV size with moderate systolic dysfunction, EF 41%.   3. No myocardial LGE so no definitive evidence for prior MI, infiltrative disease, or myocarditis.  R/L heart cath 08/2021     Mid Cx lesion is 30% stenosed.   Prox RCA to Mid RCA lesion is 25% stenosed.   1. Mild, nonobstructive CAD. Nonischemic cardiomyopathy.  2. Elevated right and left heart filling pressures.  3. Preserved cardiac output.  4. PAPI low at 1.5.  5. Pulmonary venous hypertension.     Laboratory Data:  High Sensitivity Troponin:   Recent Labs  Lab 04/08/22 2255 04/18/22 1722 04/18/22 2356 05/05/22 2148 05/06/22 0132  TROPONINIHS '10 12 12 '$ 33* 27*     Chemistry Recent Labs  Lab 05/05/22 2148  NA 132*  K 3.9  CL 102  CO2 20*  GLUCOSE 310*  BUN 17  CREATININE 1.01*  CALCIUM 8.7*  GFRNONAA >60  ANIONGAP 10    No results for  input(s): "PROT", "ALBUMIN", "AST", "ALT", "ALKPHOS", "BILITOT" in the last 168 hours. Lipids No results for input(s): "CHOL", "TRIG", "HDL", "LABVLDL", "LDLCALC", "CHOLHDL" in the last 168 hours.  Hematology Recent Labs  Lab 05/05/22 2148  WBC 9.3  RBC 4.62  HGB 10.7*  HCT 33.1*  MCV 71.6*  MCH 23.2*  MCHC 32.3  RDW 18.1*  PLT 315   Thyroid No results for input(s): "TSH", "FREET4" in the last 168 hours.  BNP Recent Labs  Lab 05/05/22 2148  BNP 782.2*    DDimer No results for input(s): "DDIMER" in the last 168 hours.   Radiology/Studies:  CT Angio Chest/Abd/Pel for Dissection W and/or Wo Contrast  Result Date:  05/06/2022 CLINICAL DATA:  Aortic atherosclerosis, chest pain EXAM: CT ANGIOGRAPHY CHEST, ABDOMEN AND PELVIS TECHNIQUE: Non-contrast CT of the chest was initially obtained. Multidetector CT imaging through the chest, abdomen and pelvis was performed using the standard protocol during bolus administration of intravenous contrast. Multiplanar reconstructed images and MIPs were obtained and reviewed to evaluate the vascular anatomy. RADIATION DOSE REDUCTION: This exam was performed according to the departmental dose-optimization program which includes automated exposure control, adjustment of the mA and/or kV according to patient size and/or use of iterative reconstruction technique. CONTRAST:  40m OMNIPAQUE IOHEXOL 350 MG/ML SOLN COMPARISON:  04/08/2022 FINDINGS: CTA CHEST FINDINGS Cardiovascular: Heart is normal size. Aorta is normal caliber. No evidence of aortic dissection. No visible pulmonary embolus. Mediastinum/Nodes: No mediastinal, hilar, or axillary adenopathy. Trachea and esophagus are unremarkable. Thyroid unremarkable. Lungs/Pleura: Lungs are clear. No focal airspace opacities or suspicious nodules. No effusions. Musculoskeletal: Chest wall soft tissues are unremarkable. No acute bony abnormality. Review of the MIP images confirms the above findings. CTA ABDOMEN AND PELVIS FINDINGS VASCULAR Aorta: Scattered aortic calcifications.  No aneurysm or dissection. Celiac: Patent without evidence of aneurysm, dissection, vasculitis or significant stenosis. SMA: Patent without evidence of aneurysm, dissection, vasculitis or significant stenosis. Renals: Both renal arteries are patent without evidence of aneurysm, dissection, vasculitis, fibromuscular dysplasia or significant stenosis. IMA: Patent without evidence of aneurysm, dissection, vasculitis or significant stenosis. Inflow: Patent without evidence of aneurysm, dissection, vasculitis or significant stenosis. Veins: No obvious venous abnormality within the  limitations of this arterial phase study. Review of the MIP images confirms the above findings. NON-VASCULAR Hepatobiliary: No focal hepatic abnormality. Gallbladder unremarkable. Pancreas: No focal abnormality or ductal dilatation. Spleen: No focal abnormality.  Normal size. Adrenals/Urinary Tract: No adrenal abnormality. No focal renal abnormality. No stones or hydronephrosis. Urinary bladder is unremarkable. Stomach/Bowel: Normal appendix. Stomach, large and small bowel grossly unremarkable. Lymphatic: No adenopathy Reproductive: Uterus and adnexa unremarkable.  No mass. Other: No free fluid or free air. Musculoskeletal: No acute bony abnormality. Review of the MIP images confirms the above findings. IMPRESSION: No evidence of aortic aneurysm or dissection. Scattered aortic calcifications in the abdominal aorta. No acute findings in the chest, abdomen or pelvis. Electronically Signed   By: KRolm BaptiseM.D.   On: 05/06/2022 11:08   DG Chest 2 View  Result Date: 05/05/2022 CLINICAL DATA:  Chest pain, dyspnea EXAM: CHEST - 2 VIEW COMPARISON:  04/18/2022 FINDINGS: Lungs are clear. No pneumothorax or pleural effusion. Mild cardiomegaly is stable. Pulmonary vascularity is normal. No acute bone abnormality. IMPRESSION: Stable mild cardiomegaly. Electronically Signed   By: AFidela SalisburyM.D.   On: 05/05/2022 22:17     Assessment and Plan:   Chest pain   Atypical  I do not think it is cardiac in origin  More skeletal  muscle She does have EKG change in I, AVL which may reflect lead placement   Repeat   2  HFrEF   PT with mild increased volume on exam.   COuld consider giving  IV lasix x 1    COntinue home meds of GDMT  3  Hx PAF  Currently in SR  Continue coumadin   4  LV thrombus   Continue coumadin   INR was 1.9   I would increase coumadin to 1.5 tabs daily (she takes one on Mondays now )   WIll follw up in clinic   Will make sure she has follow up in clinic to follow up on changes   OK to  d/c  For questions or updates, please contact [place] Please consult [email or web address] for contact info under    Signed, Dorris Carnes, MD  05/06/2022 12:16 PM

## 2022-05-06 NOTE — Inpatient Diabetes Management (Signed)
Inpatient Diabetes Program Recommendations  AACE/ADA: New Consensus Statement on Inpatient Glycemic Control (2015)  Target Ranges:  Prepandial:   less than 140 mg/dL      Peak postprandial:   less than 180 mg/dL (1-2 hours)      Critically ill patients:  140 - 180 mg/dL   Lab Results  Component Value Date   GLUCAP 333 (H) 04/19/2022   HGBA1C 12.1 (H) 01/29/2022    Review of Glycemic Control  Latest Reference Range & Units 05/05/22 21:48  Glucose 70 - 99 mg/dL 310 (H)   Diabetes history: DM 2 Outpatient Diabetes medications: Jardiance 10 mg Daily, Novolog 12 units Daily at supper, Lantus 36 units Daily Current orders for Inpatient glycemic control:  None being evaluated in ED  Inpatient Diabetes Program Recommendations:    May consider Novolog 0-15 units Q4 while being evaluated in the ED.  If admitted consider: -  Semglee 30 units -  Novolog 0-15 units tid + hs  Thanks,  Tama Headings RN, MSN, BC-ADM Inpatient Diabetes Coordinator Team Pager [phone] (8a-5p)

## 2022-05-06 NOTE — ED Notes (Signed)
Reviewed pt written d/c instructions. Pt verbalized understanding. Pt assisted into wheelchair and out to triage. Pt reports calling self an [facility] to get home.

## 2022-05-06 NOTE — Discharge Instructions (Addendum)
Please return to the ED with any new symptoms Please continue taking Coumadin Please follow-up with [facility] on [address] on 05/11/2022 at 11 AM Please read attached guide concerning non-specific chest pain

## 2022-05-06 NOTE — ED Provider Notes (Signed)
[facility]ER[facility]ENC[facility] DEPARTMENT Provider Note   CSN: [id number] Arrival date & time: 05/05/22  2125     History  Chief Complaint  Patient presents with   Chest Pain    [name] is a 46 y.o. female with medical history of CHF, nonischemic cardiomyopathy, left ventricular thrombus, hypertensive emergency, type 2 diabetes, A-fib on Coumadin, hypertension.  Patient presents to the ED for evaluation of chest pain, generalized body aches, nausea and vomiting, leg swelling.  Patient reports that ever since having a heart attack on 02/22/2022 she has had consistent shortness of breath and chest pain.  The patient reports that she has been unable to keep any food down on her stomach since this time and has had intermittent nausea and vomiting since August.  On chart review, it appears that this patient was seen in the ED on 04/08/2022 for dyspnea as well as on 04/19/2019 for essential hypertension.  Both of these dates, the patient presented complaining of similar complaints to what she is here for today.  The patient on 04/18/2022 had unremarkable work-up and was discharged home and advised to follow-up with her PCP or cardiologist.  Patient had echocardiogram done on 8/2 which showed a left ventricular thrombus with a left ventricular ejection fraction of less than 20% which the patient has been placed on Coumadin for.  Today the patient denies any fever, diarrhea, dysuria, sore throat, one-sided weakness or numbness.  Patient found sitting on all fours in hospital bed as she states this is the only way she can become comfortable.  Patient complaining of pain all over.   Chest Pain Associated symptoms: nausea and vomiting   Associated symptoms: no back pain, no fever, no numbness and no weakness        Home Medications Prior to Admission medications   Medication Sig Start Date End Date Taking? Authorizing Provider  acetaminophen (TYLENOL) 325 MG tablet Take 2 tablets (650  mg total) by mouth every 6 (six) hours as needed for mild pain (or Fever >/= 101). 02/27/22  Yes Lama, Marge Duncans, MD  atorvastatin (LIPITOR) 40 MG tablet TAKE 1 TABLET BY MOUTH DAILY AT 6 PM. Patient taking differently: Take 40 mg by mouth daily. 04/01/20 08/27/22 Yes Larey Dresser, MD  carvedilol (COREG) 3.125 MG tablet Take 1 tablet (3.125 mg total) by mouth 2 (two) times daily with a meal. 02/27/22  Yes Darrick Meigs, Marge Duncans, MD  digoxin (LANOXIN) 0.125 MG tablet Take 1 tablet (0.125 mg total) by mouth daily. 11/08/21  Yes Sharion Settler, DO  empagliflozin (JARDIANCE) 10 MG TABS tablet Take 1 tablet (10 mg total) by mouth daily. 11/08/21  Yes Sharion Settler, DO  furosemide (LASIX) 80 MG tablet Take 1 tablet (80 mg total) by mouth every morning. 02/27/22  Yes Oswald Hillock, MD  hydrOXYzine (ATARAX) 10 MG tablet Take 1 tablet (10 mg total) by mouth 3 (three) times daily as needed. Patient taking differently: Take 10 mg by mouth 3 (three) times daily as needed for anxiety. 10/22/21  Yes Gladys Damme, MD  insulin aspart (NOVOLOG FLEXPEN) 100 UNIT/ML FlexPen Inject 12 Units into the skin daily before supper. 09/02/21  Yes Hensel, Jamal Collin, MD  insulin glargine (LANTUS) 100 UNIT/ML Solostar Pen Inject 36 Units into the skin every morning. 09/02/21  Yes Hensel, Jamal Collin, MD  losartan (COZAAR) 25 MG tablet Take 1 tablet (25 mg total) by mouth daily. 02/28/22  Yes Oswald Hillock, MD  spironolactone (ALDACTONE) 25 MG  tablet Take 0.5 tablets (12.5 mg total) by mouth daily. 11/08/21  Yes Sharion Settler, DO  traZODone (DESYREL) 50 MG tablet Take 1 tablet (50 mg total) by mouth at bedtime. 04/26/22  Yes Darci Current, DO  Accu-Chek Softclix Lancets lancets Use as instructed 02/12/19   Caroline More, DO  Insulin Pen Needle (UNIFINE PENTIPS) 31G X 5 MM MISC USE AS DIRECTED 2 TIMES DAILY 09/02/21   Zenia Resides, MD  ondansetron (ZOFRAN) 4 MG tablet Take 1 tablet (4 mg total) by mouth every 8 (eight) hours as needed  for nausea or vomiting. Patient not taking: Reported on 05/06/2022 02/28/22   Oswald Hillock, MD  pantoprazole (PROTONIX) 40 MG tablet Take 1 tablet (40 mg total) by mouth daily. Patient not taking: Reported on 05/06/2022 02/27/22   Oswald Hillock, MD  warfarin (COUMADIN) 7.5 MG tablet Take 1-2 tablets (7.5-15 mg total) by mouth daily as directed by [facility] Patient not taking: Reported on 05/06/2022 05/02/22   Janina Mayo, MD      Allergies    Shellfish allergy, Metformin and related, Trulicity [dulaglutide], Nsaids, and Advil [ibuprofen]    Review of Systems   Review of Systems  Constitutional:  Negative for fever.  Cardiovascular:  Positive for chest pain.  Gastrointestinal:  Positive for nausea and vomiting. Negative for diarrhea.  Genitourinary:  Negative for dysuria.  Musculoskeletal:  Positive for myalgias. Negative for back pain.  Neurological:  Negative for weakness and numbness.  All other systems reviewed and are negative.   Physical Exam Updated Vital Signs BP (!) 153/117   Pulse 92   Temp 98.9 F (37.2 C) (Axillary)   Resp 19   LMP 04/08/2022   SpO2 95%  Physical Exam Vitals and nursing note reviewed.  Constitutional:      General: She is not in acute distress.    Appearance: Normal appearance. She is not ill-appearing, toxic-appearing or diaphoretic.  HENT:     Head: Normocephalic and atraumatic.     Nose: Nose normal. No congestion.     Mouth/Throat:     Mouth: Mucous membranes are moist.     Pharynx: Oropharynx is clear.  Eyes:     Extraocular Movements: Extraocular movements intact.     Conjunctiva/sclera: Conjunctivae normal.     Pupils: Pupils are equal, round, and reactive to light.  Cardiovascular:     Rate and Rhythm: Normal rate and regular rhythm.  Pulmonary:     Effort: Pulmonary effort is normal.     Breath sounds: Normal breath sounds. No wheezing.  Abdominal:     General: Abdomen is flat. Bowel sounds are normal.     Palpations:  Abdomen is soft.     Tenderness: There is no abdominal tenderness.  Musculoskeletal:     Cervical back: Normal range of motion and neck supple. No tenderness.     Right lower leg: Edema present.     Left lower leg: Edema present.  Skin:    General: Skin is warm and dry.     Capillary Refill: Capillary refill takes less than 2 seconds.  Neurological:     Mental Status: She is alert and oriented to person, place, and time.     ED Results / Procedures / Treatments   Labs (all labs ordered are listed, but only abnormal results are displayed) Labs Reviewed  BASIC METABOLIC PANEL - Abnormal; Notable for the following components:      Result Value   Sodium 132 (*)  CO2 20 (*)    Glucose, Bld 310 (*)    Creatinine, Ser 1.01 (*)    Calcium 8.7 (*)    All other components within normal limits  CBC - Abnormal; Notable for the following components:   Hemoglobin 10.7 (*)    HCT 33.1 (*)    MCV 71.6 (*)    MCH 23.2 (*)    RDW 18.1 (*)    All other components within normal limits  BRAIN NATRIURETIC PEPTIDE - Abnormal; Notable for the following components:   B Natriuretic Peptide 782.2 (*)    All other components within normal limits  PROTIME-INR - Abnormal; Notable for the following components:   Prothrombin Time 21.2 (*)    INR 1.9 (*)    All other components within normal limits  TROPONIN I (HIGH SENSITIVITY) - Abnormal; Notable for the following components:   Troponin I (High Sensitivity) 33 (*)    All other components within normal limits  TROPONIN I (HIGH SENSITIVITY) - Abnormal; Notable for the following components:   Troponin I (High Sensitivity) 27 (*)    All other components within normal limits  APTT    EKG   Radiology CT Angio Chest/Abd/Pel for Dissection W and/or Wo Contrast  Result Date: 05/06/2022 CLINICAL DATA:  Aortic atherosclerosis, chest pain EXAM: CT ANGIOGRAPHY CHEST, ABDOMEN AND PELVIS TECHNIQUE: Non-contrast CT of the chest was initially obtained.  Multidetector CT imaging through the chest, abdomen and pelvis was performed using the standard protocol during bolus administration of intravenous contrast. Multiplanar reconstructed images and MIPs were obtained and reviewed to evaluate the vascular anatomy. RADIATION DOSE REDUCTION: This exam was performed according to the departmental dose-optimization program which includes automated exposure control, adjustment of the mA and/or kV according to patient size and/or use of iterative reconstruction technique. CONTRAST:  13m OMNIPAQUE IOHEXOL 350 MG/ML SOLN COMPARISON:  04/08/2022 FINDINGS: CTA CHEST FINDINGS Cardiovascular: Heart is normal size. Aorta is normal caliber. No evidence of aortic dissection. No visible pulmonary embolus. Mediastinum/Nodes: No mediastinal, hilar, or axillary adenopathy. Trachea and esophagus are unremarkable. Thyroid unremarkable. Lungs/Pleura: Lungs are clear. No focal airspace opacities or suspicious nodules. No effusions. Musculoskeletal: Chest wall soft tissues are unremarkable. No acute bony abnormality. Review of the MIP images confirms the above findings. CTA ABDOMEN AND PELVIS FINDINGS VASCULAR Aorta: Scattered aortic calcifications.  No aneurysm or dissection. Celiac: Patent without evidence of aneurysm, dissection, vasculitis or significant stenosis. SMA: Patent without evidence of aneurysm, dissection, vasculitis or significant stenosis. Renals: Both renal arteries are patent without evidence of aneurysm, dissection, vasculitis, fibromuscular dysplasia or significant stenosis. IMA: Patent without evidence of aneurysm, dissection, vasculitis or significant stenosis. Inflow: Patent without evidence of aneurysm, dissection, vasculitis or significant stenosis. Veins: No obvious venous abnormality within the limitations of this arterial phase study. Review of the MIP images confirms the above findings. NON-VASCULAR Hepatobiliary: No focal hepatic abnormality. Gallbladder  unremarkable. Pancreas: No focal abnormality or ductal dilatation. Spleen: No focal abnormality.  Normal size. Adrenals/Urinary Tract: No adrenal abnormality. No focal renal abnormality. No stones or hydronephrosis. Urinary bladder is unremarkable. Stomach/Bowel: Normal appendix. Stomach, large and small bowel grossly unremarkable. Lymphatic: No adenopathy Reproductive: Uterus and adnexa unremarkable.  No mass. Other: No free fluid or free air. Musculoskeletal: No acute bony abnormality. Review of the MIP images confirms the above findings. IMPRESSION: No evidence of aortic aneurysm or dissection. Scattered aortic calcifications in the abdominal aorta. No acute findings in the chest, abdomen or pelvis. Electronically Signed   By: KLennette Bihari  [place] M.D.   On: 05/06/2022 11:08   DG Chest 2 View  Result Date: 05/05/2022 CLINICAL DATA:  Chest pain, dyspnea EXAM: CHEST - 2 VIEW COMPARISON:  04/18/2022 FINDINGS: Lungs are clear. No pneumothorax or pleural effusion. Mild cardiomegaly is stable. Pulmonary vascularity is normal. No acute bone abnormality. IMPRESSION: Stable mild cardiomegaly. Electronically Signed   By: Fidela Salisbury M.D.   On: 05/05/2022 22:17    Procedures Procedures   Medications Ordered in ED Medications  ondansetron (ZOFRAN) injection 4 mg (4 mg Intravenous Given 05/06/22 0909)  furosemide (LASIX) injection 40 mg (40 mg Intravenous Given 05/06/22 0913)  morphine (PF) 4 MG/ML injection 4 mg (4 mg Intravenous Given 05/06/22 0910)  losartan (COZAAR) tablet 25 mg (25 mg Oral Given 05/06/22 0912)  alum & mag hydroxide-simeth (MAALOX/MYLANTA) 200-200-20 MG/5ML suspension 30 mL (30 mLs Oral Given 05/06/22 0909)  iohexol (OMNIPAQUE) 350 MG/ML injection 75 mL (75 mLs Intravenous Contrast Given 05/06/22 1102)  labetalol (NORMODYNE) injection 10 mg (10 mg Intravenous Given 05/06/22 1141)    ED Course/ Medical Decision Making/ A&P                           Medical Decision Making Amount and/or  Complexity of Data Reviewed Labs: ordered.  Risk Prescription drug management.   46 year old female presents to ED for evaluation.  Please see HPI for further details.  On examination patient is afebrile and nontachycardic.  Patient lung sounds clear bilaterally, not hypoxic.  Patient abdomen soft and compressible throughout.  The patient neurological examination shows no focal neurodeficits.  Patient work-up with the following: PT/INR, APTT, troponin, BNP, CBC, BMP, chest x-ray, CT angio chest abdomen pelvis for dissection  Medications administered prior to lab work include: 4 mg morphine for pain, GI cocktail, 4 mg Zofran for nausea, 25 mg losartan for blood pressure, 40 mg Lasix for diuresis.  Patient lab work with increased PT/INR which is to be expected as the patient is on Coumadin, normalized APTT, troponin of 33, delta of 27.  Patient BNP 782.2 which has been uptrending slowly over the past 3 months.  The patient CBC with decreased hemoglobin at 10.7 which is consistent with patient baseline.  The patient BMP shows decreased sodium of 132, elevated creatinine 1.1.  Patient EKG nonischemic.  Patient CT angio for dissection unremarkable.  Patient plain film imaging of chest shows no consolidations, does show stable cardiomegaly.  Dr. Harrington Challenger, cardiology, has been consulted for the patient.  Dr. Harrington Challenger states that this patient is stable for discharge, she will follow-up in the clinic this week.  Per Dr. Alan Ripper note, Dr. Harrington Challenger states that she will "make sure she has follow-up in clinic to follow-up on changes.  Okay to discharge".  Patient given return precautions and she has voiced understanding.  Patient had all of her questions answered to her satisfaction.  The patient is stable at this time for discharge home.  Final Clinical Impression(s) / ED Diagnoses Final diagnoses:  Chest pain, unspecified type    Rx / DC Orders ED Discharge Orders     None         Azucena Cecil,  PA-C 05/06/22 1520    Elgie Congo, MD 05/07/22 [phone]

## 2022-05-09 ENCOUNTER — Other Ambulatory Visit (HOSPITAL_COMMUNITY): Payer: Self-pay

## 2022-05-09 ENCOUNTER — Encounter: Payer: Self-pay | Admitting: Student

## 2022-05-09 ENCOUNTER — Ambulatory Visit (INDEPENDENT_AMBULATORY_CARE_PROVIDER_SITE_OTHER): Payer: Medicare Other | Admitting: Student

## 2022-05-09 VITALS — BP 140/100 | HR 95 | Ht 59.0 in

## 2022-05-09 DIAGNOSIS — G894 Chronic pain syndrome: Secondary | ICD-10-CM

## 2022-05-09 DIAGNOSIS — G8929 Other chronic pain: Secondary | ICD-10-CM | POA: Insufficient documentation

## 2022-05-09 DIAGNOSIS — I2510 Atherosclerotic heart disease of native coronary artery without angina pectoris: Secondary | ICD-10-CM

## 2022-05-09 MED ORDER — FUROSEMIDE 80 MG PO TABS
80.0000 mg | ORAL_TABLET | Freq: Every morning | ORAL | 11 refills | Status: DC
  Filled 2022-05-09: qty 30, 30d supply, fill #0

## 2022-05-09 MED ORDER — TRAZODONE HCL 50 MG PO TABS
50.0000 mg | ORAL_TABLET | Freq: Every day | ORAL | 0 refills | Status: DC
  Filled 2022-05-09: qty 60, 60d supply, fill #0

## 2022-05-09 MED ORDER — ONDANSETRON 4 MG PO TBDP
4.0000 mg | ORAL_TABLET | Freq: Three times a day (TID) | ORAL | 0 refills | Status: DC | PRN
  Filled 2022-05-09: qty 20, 7d supply, fill #0

## 2022-05-09 MED ORDER — TRAMADOL HCL 50 MG PO TABS
50.0000 mg | ORAL_TABLET | Freq: Two times a day (BID) | ORAL | 0 refills | Status: AC
  Filled 2022-05-09: qty 10, 5d supply, fill #0

## 2022-05-09 NOTE — Assessment & Plan Note (Signed)
Patient is very frustrated about pain management, instructed to continue Tylenol.  - Sent in referral for pain management clinic - Sent in 5 days tramadol 50 mg twice daily

## 2022-05-09 NOTE — Progress Notes (Signed)
    SUBJECTIVE:   CHIEF COMPLAINT / HPI:   [name] is a 46 y.o. female presenting today for office follow-up on chronic pain, edema, chronic nausea vomiting.  She is recently seen in the ED for new onset chest pain.  She was negative for NSTEMI/STEMI and was given IV Lasix before returning home.  She reports her chest pain has now resolved.  She is an ongoing battle with chronic pain that she is currently intermittently taking Tylenol for.  She would like something stronger for pain and understands that this is complicated with her kidney disease, anticoagulation and cardiac history.  She has tried gabapentin in the past and reports this has not relieved her pain.  She is very frustrated with current management, but is open to going to pain management clinic.  PERTINENT  PMH / PSH: HTN, Migraines, A fib, T2DM, Chronic combined HF, Hx of NSTEMI, OSA  OBJECTIVE:   BP (!) 140/100   Pulse 95   Ht '4\' 11"'$  (1.499 m)   LMP 04/08/2022   BMI 36.36 kg/m   Chronically ill-appearing, no acute distress Cardio: Regular rate, regular rhythm, no murmurs on exam. Pulm: Clear, no wheezing, no crackles. No increased work of breathing Abdominal: bowel sounds present, soft, non-tender, non-distended Extremities: 1-2+ pitting edema  ASSESSMENT/PLAN:   Chronic pain Patient is very frustrated about pain management, instructed to continue Tylenol.  - Sent in referral for pain management clinic - Sent in 5 days tramadol 50 mg twice daily   Darci Current, [place]

## 2022-05-09 NOTE — Patient Instructions (Addendum)
It was great to see you today! Thank you for choosing [facility] for your primary care.   Today we addressed: Your chronic pain: I am sending in a referral to pain management. If you do not receive a call back in the next 2 weeks, call our office and let us know.  Chronic nausea and vomiting, I sent in a prescription for the dissolving Zofran. Let me know if this improves your nausea.   If you haven't already, sign up for My Chart to have easy access to your labs results, and communication with your primary care physician.  We are checking some labs today. If they are abnormal, I will call you. If they are normal, I will send you a MyChart message (if it is active) or a letter in the mail. If you do not hear about your labs in the next 2 weeks, please call the office.   You should return to our clinic Return in about 6 months (around 11/08/2022).  I recommend that you always bring your medications to each appointment as this makes it easy to ensure you are on the correct medications and helps [place] not miss refills when you need them.  Please arrive 15 minutes before your appointment to ensure smooth check in process.  We appreciate your efforts in making this happen.  Please call the clinic at [phone] if your symptoms worsen or you have any concerns.  Thank you for allowing me to participate in your care, Dr. Sabra Heck

## 2022-05-11 ENCOUNTER — Telehealth: Payer: Self-pay | Admitting: Family Medicine

## 2022-05-11 ENCOUNTER — Encounter: Payer: Self-pay | Admitting: Family Medicine

## 2022-05-11 ENCOUNTER — Ambulatory Visit: Payer: Medicare Other | Attending: Cardiology

## 2022-05-11 NOTE — Telephone Encounter (Signed)
HIPAA compliant callback message left.  When she calls back, please advise >  She requested PCP switch during her last visit. I was calling to inform her that her new PCP is now Dr. Larae Grooms.  Please, advise her that her PCP switch request is now complete.

## 2022-06-02 ENCOUNTER — Emergency Department (HOSPITAL_COMMUNITY): Payer: Medicare Other

## 2022-06-02 ENCOUNTER — Other Ambulatory Visit: Payer: Self-pay

## 2022-06-02 ENCOUNTER — Inpatient Hospital Stay (HOSPITAL_COMMUNITY)
Admission: EM | Admit: 2022-06-02 | Discharge: 2022-06-06 | DRG: 304 | Disposition: A | Discharge status: Home / Self Care | Payer: Medicare Other | Attending: Internal Medicine | Admitting: Internal Medicine

## 2022-06-02 ENCOUNTER — Encounter (HOSPITAL_COMMUNITY): Payer: Self-pay | Admitting: Emergency Medicine

## 2022-06-02 DIAGNOSIS — K5900 Constipation, unspecified: Secondary | ICD-10-CM | POA: Diagnosis not present

## 2022-06-02 DIAGNOSIS — I428 Other cardiomyopathies: Secondary | ICD-10-CM | POA: Diagnosis present

## 2022-06-02 DIAGNOSIS — E1159 Type 2 diabetes mellitus with other circulatory complications: Secondary | ICD-10-CM | POA: Diagnosis present

## 2022-06-02 DIAGNOSIS — I2510 Atherosclerotic heart disease of native coronary artery without angina pectoris: Secondary | ICD-10-CM | POA: Diagnosis present

## 2022-06-02 DIAGNOSIS — D638 Anemia in other chronic diseases classified elsewhere: Secondary | ICD-10-CM | POA: Diagnosis present

## 2022-06-02 DIAGNOSIS — E785 Hyperlipidemia, unspecified: Secondary | ICD-10-CM | POA: Diagnosis present

## 2022-06-02 DIAGNOSIS — R601 Generalized edema: Principal | ICD-10-CM

## 2022-06-02 DIAGNOSIS — I5041 Acute combined systolic (congestive) and diastolic (congestive) heart failure: Secondary | ICD-10-CM | POA: Diagnosis not present

## 2022-06-02 DIAGNOSIS — T501X6A Underdosing of loop [high-ceiling] diuretics, initial encounter: Secondary | ICD-10-CM | POA: Diagnosis present

## 2022-06-02 DIAGNOSIS — I252 Old myocardial infarction: Secondary | ICD-10-CM

## 2022-06-02 DIAGNOSIS — Z7984 Long term (current) use of oral hypoglycemic drugs: Secondary | ICD-10-CM

## 2022-06-02 DIAGNOSIS — Z888 Allergy status to other drugs, medicaments and biological substances status: Secondary | ICD-10-CM

## 2022-06-02 DIAGNOSIS — J45909 Unspecified asthma, uncomplicated: Secondary | ICD-10-CM | POA: Diagnosis present

## 2022-06-02 DIAGNOSIS — G4733 Obstructive sleep apnea (adult) (pediatric): Secondary | ICD-10-CM | POA: Diagnosis present

## 2022-06-02 DIAGNOSIS — T500X6A Underdosing of mineralocorticoids and their antagonists, initial encounter: Secondary | ICD-10-CM | POA: Diagnosis present

## 2022-06-02 DIAGNOSIS — E1169 Type 2 diabetes mellitus with other specified complication: Secondary | ICD-10-CM | POA: Diagnosis present

## 2022-06-02 DIAGNOSIS — Z833 Family history of diabetes mellitus: Secondary | ICD-10-CM

## 2022-06-02 DIAGNOSIS — E1165 Type 2 diabetes mellitus with hyperglycemia: Secondary | ICD-10-CM | POA: Diagnosis present

## 2022-06-02 DIAGNOSIS — Z7901 Long term (current) use of anticoagulants: Secondary | ICD-10-CM | POA: Diagnosis not present

## 2022-06-02 DIAGNOSIS — R519 Headache, unspecified: Secondary | ICD-10-CM | POA: Diagnosis not present

## 2022-06-02 DIAGNOSIS — G8929 Other chronic pain: Secondary | ICD-10-CM | POA: Diagnosis present

## 2022-06-02 DIAGNOSIS — E871 Hypo-osmolality and hyponatremia: Secondary | ICD-10-CM | POA: Diagnosis present

## 2022-06-02 DIAGNOSIS — I2489 Other forms of acute ischemic heart disease: Secondary | ICD-10-CM | POA: Diagnosis present

## 2022-06-02 DIAGNOSIS — T466X6A Underdosing of antihyperlipidemic and antiarteriosclerotic drugs, initial encounter: Secondary | ICD-10-CM | POA: Diagnosis present

## 2022-06-02 DIAGNOSIS — Z79899 Other long term (current) drug therapy: Secondary | ICD-10-CM | POA: Diagnosis not present

## 2022-06-02 DIAGNOSIS — I5043 Acute on chronic combined systolic (congestive) and diastolic (congestive) heart failure: Secondary | ICD-10-CM | POA: Diagnosis present

## 2022-06-02 DIAGNOSIS — I160 Hypertensive urgency: Principal | ICD-10-CM | POA: Diagnosis present

## 2022-06-02 DIAGNOSIS — I152 Hypertension secondary to endocrine disorders: Secondary | ICD-10-CM | POA: Diagnosis present

## 2022-06-02 DIAGNOSIS — I10 Essential (primary) hypertension: Secondary | ICD-10-CM

## 2022-06-02 DIAGNOSIS — M7989 Other specified soft tissue disorders: Secondary | ICD-10-CM | POA: Diagnosis present

## 2022-06-02 DIAGNOSIS — T471X6A Underdosing of other antacids and anti-gastric-secretion drugs, initial encounter: Secondary | ICD-10-CM | POA: Diagnosis present

## 2022-06-02 DIAGNOSIS — Z8249 Family history of ischemic heart disease and other diseases of the circulatory system: Secondary | ICD-10-CM | POA: Diagnosis not present

## 2022-06-02 DIAGNOSIS — I480 Paroxysmal atrial fibrillation: Secondary | ICD-10-CM | POA: Diagnosis present

## 2022-06-02 DIAGNOSIS — Z91128 Patient's intentional underdosing of medication regimen for other reason: Secondary | ICD-10-CM

## 2022-06-02 DIAGNOSIS — T447X6A Underdosing of beta-adrenoreceptor antagonists, initial encounter: Secondary | ICD-10-CM | POA: Diagnosis present

## 2022-06-02 DIAGNOSIS — T45516A Underdosing of anticoagulants, initial encounter: Secondary | ICD-10-CM | POA: Diagnosis present

## 2022-06-02 DIAGNOSIS — Z86718 Personal history of other venous thrombosis and embolism: Secondary | ICD-10-CM

## 2022-06-02 DIAGNOSIS — T465X6A Underdosing of other antihypertensive drugs, initial encounter: Secondary | ICD-10-CM | POA: Diagnosis present

## 2022-06-02 HISTORY — DX: Hypertensive urgency: I16.0

## 2022-06-02 LAB — URINALYSIS, ROUTINE W REFLEX MICROSCOPIC
Bacteria, UA: NONE SEEN
Bilirubin Urine: NEGATIVE
Glucose, UA: >=500 mg/dL — AB
Hgb urine dipstick: NEGATIVE
Ketones, ur: NEGATIVE mg/dL
Nitrite: NEGATIVE
Protein, ur: 30 mg/dL — AB
Specific Gravity, Urine: 1.029 (ref 1.005–1.030)
pH: 6 (ref 5.0–8.0)

## 2022-06-02 LAB — COMPREHENSIVE METABOLIC PANEL
ALT: 15 U/L (ref 0–44)
AST: 22 U/L (ref 15–41)
Albumin: 2.7 g/dL — ABNORMAL LOW (ref 3.5–5.0)
Alkaline Phosphatase: 176 U/L — ABNORMAL HIGH (ref 38–126)
Anion gap: 11 (ref 5–15)
BUN: 11 mg/dL (ref 6–20)
CO2: 24 mmol/L (ref 22–32)
Calcium: 8.5 mg/dL — ABNORMAL LOW (ref 8.9–10.3)
Chloride: 92 mmol/L — ABNORMAL LOW (ref 98–111)
Creatinine, Ser: 0.92 mg/dL (ref 0.44–1.00)
GFR, Estimated: >60 mL/min (ref >60)
Glucose, Bld: 543 mg/dL (ref 70–99)
Potassium: 4.3 mmol/L (ref 3.5–5.1)
Sodium: 127 mmol/L — ABNORMAL LOW (ref 135–145)
Total Bilirubin: 1.9 mg/dL — ABNORMAL HIGH (ref 0.3–1.2)
Total Protein: 7.1 g/dL (ref 6.5–8.1)

## 2022-06-02 LAB — TROPONIN I (HIGH SENSITIVITY)
Troponin I (High Sensitivity): 38 ng/L — ABNORMAL HIGH (ref <18)
Troponin I (High Sensitivity): 31 ng/L — ABNORMAL HIGH (ref <18)

## 2022-06-02 LAB — BLOOD GAS, VENOUS
Acid-Base Excess: 5.2 mmol/L — ABNORMAL HIGH (ref 0.0–2.0)
Bicarbonate: 29 mmol/L — ABNORMAL HIGH (ref 20.0–28.0)
O2 Saturation: 99.9 %
Patient temperature: 37
pCO2, Ven: 39 mmHg — ABNORMAL LOW (ref 44–60)
pH, Ven: 7.48 — ABNORMAL HIGH (ref 7.25–7.43)
pO2, Ven: 104 mmHg — ABNORMAL HIGH (ref 32–45)

## 2022-06-02 LAB — TSH: TSH: 1.321 u[IU]/mL (ref 0.350–4.500)

## 2022-06-02 LAB — CBC
HCT: 37 % (ref 36.0–46.0)
Hemoglobin: 11.3 g/dL — ABNORMAL LOW (ref 12.0–15.0)
MCH: 21.3 pg — ABNORMAL LOW (ref 26.0–34.0)
MCHC: 30.5 g/dL (ref 30.0–36.0)
MCV: 69.7 fL — ABNORMAL LOW (ref 80.0–100.0)
Platelets: 283 10*3/uL (ref 150–400)
RBC: 5.31 MIL/uL — ABNORMAL HIGH (ref 3.87–5.11)
RDW: 20.1 % — ABNORMAL HIGH (ref 11.5–15.5)
WBC: 6.4 10*3/uL (ref 4.0–10.5)
nRBC: 0 % (ref 0.0–0.2)

## 2022-06-02 LAB — BRAIN NATRIURETIC PEPTIDE: B Natriuretic Peptide: 758.1 pg/mL — ABNORMAL HIGH (ref 0.0–100.0)

## 2022-06-02 LAB — CBG MONITORING, ED
Glucose-Capillary: 579 mg/dL (ref 70–99)
Glucose-Capillary: 378 mg/dL — ABNORMAL HIGH (ref 70–99)

## 2022-06-02 LAB — PROTIME-INR
INR: 3.5 — ABNORMAL HIGH (ref 0.8–1.2)
Prothrombin Time: 34.5 seconds — ABNORMAL HIGH (ref 11.4–15.2)

## 2022-06-02 LAB — I-STAT BETA HCG BLOOD, ED (MC, WL, AP ONLY): I-stat hCG, quantitative: <5 m[IU]/mL (ref <5)

## 2022-06-02 LAB — APTT: aPTT: 33 seconds (ref 24–36)

## 2022-06-02 MED ORDER — CARVEDILOL 3.125 MG PO TABS
3.1250 mg | ORAL_TABLET | Freq: Two times a day (BID) | ORAL | Status: DC
  Administered 2022-06-03 – 2022-06-06 (×7): 3.125 mg via ORAL
  Filled 2022-06-02 (×7): qty 1

## 2022-06-02 MED ORDER — MORPHINE SULFATE (PF) 2 MG/ML IV SOLN
2.0000 mg | Freq: Once | INTRAVENOUS | Status: AC
  Administered 2022-06-02: 2 mg via INTRAVENOUS
  Filled 2022-06-02: qty 1

## 2022-06-02 MED ORDER — NITROGLYCERIN 2 % TD OINT: 1.0000 [in_us] | TOPICAL_OINTMENT | Freq: Four times a day (QID) | TRANSDERMAL | Status: AC | PRN

## 2022-06-02 MED ORDER — ONDANSETRON HCL 4 MG/2ML IJ SOLN
4.0000 mg | Freq: Once | INTRAMUSCULAR | Status: AC
  Administered 2022-06-02: 4 mg via INTRAVENOUS
  Filled 2022-06-02: qty 2

## 2022-06-02 MED ORDER — LOSARTAN POTASSIUM 25 MG PO TABS
25.0000 mg | ORAL_TABLET | Freq: Every day | ORAL | Status: DC
  Administered 2022-06-02 – 2022-06-06 (×5): 25 mg via ORAL
  Filled 2022-06-02 (×5): qty 1

## 2022-06-02 MED ORDER — EMPAGLIFLOZIN 10 MG PO TABS
10.0000 mg | ORAL_TABLET | Freq: Every day | ORAL | Status: DC
  Administered 2022-06-02 – 2022-06-06 (×5): 10 mg via ORAL
  Filled 2022-06-02 (×5): qty 1

## 2022-06-02 MED ORDER — SPIRONOLACTONE 12.5 MG HALF TABLET
12.5000 mg | ORAL_TABLET | Freq: Every day | ORAL | Status: DC
  Administered 2022-06-02 – 2022-06-06 (×5): 12.5 mg via ORAL
  Filled 2022-06-02 (×5): qty 1

## 2022-06-02 MED ORDER — FUROSEMIDE 10 MG/ML IJ SOLN
40.0000 mg | Freq: Once | INTRAMUSCULAR | Status: AC
  Administered 2022-06-02: 40 mg via INTRAVENOUS
  Filled 2022-06-02: qty 4

## 2022-06-02 MED ORDER — INSULIN ASPART 100 UNIT/ML IJ SOLN
0.0000 [IU] | Freq: Three times a day (TID) | INTRAMUSCULAR | Status: DC
  Administered 2022-06-03: 3 [IU] via SUBCUTANEOUS
  Administered 2022-06-04: 8 [IU] via SUBCUTANEOUS
  Administered 2022-06-04 (×2): 3 [IU] via SUBCUTANEOUS
  Administered 2022-06-05: 5 [IU] via SUBCUTANEOUS
  Administered 2022-06-05 (×2): 2 [IU] via SUBCUTANEOUS
  Administered 2022-06-06: 3 [IU] via SUBCUTANEOUS
  Filled 2022-06-02: qty 0.15

## 2022-06-02 MED ORDER — HYDRALAZINE HCL 20 MG/ML IJ SOLN
10.0000 mg | Freq: Four times a day (QID) | INTRAMUSCULAR | Status: DC | PRN
  Administered 2022-06-03 (×2): 10 mg via INTRAVENOUS
  Filled 2022-06-02 (×2): qty 1

## 2022-06-02 MED ORDER — ONDANSETRON HCL 4 MG PO TABS: 4.0000 mg | ORAL_TABLET | Freq: Four times a day (QID) | ORAL | Status: DC | PRN

## 2022-06-02 MED ORDER — ATORVASTATIN CALCIUM 40 MG PO TABS
40.0000 mg | ORAL_TABLET | ORAL | Status: DC
  Administered 2022-06-03 – 2022-06-06 (×2): 40 mg via ORAL
  Filled 2022-06-02 (×2): qty 1

## 2022-06-02 MED ORDER — ALBUTEROL SULFATE (2.5 MG/3ML) 0.083% IN NEBU: 2.5000 mg | INHALATION_SOLUTION | RESPIRATORY_TRACT | Status: DC | PRN

## 2022-06-02 MED ORDER — FUROSEMIDE 10 MG/ML IJ SOLN
60.0000 mg | Freq: Two times a day (BID) | INTRAMUSCULAR | Status: DC
  Administered 2022-06-03 – 2022-06-05 (×5): 60 mg via INTRAVENOUS
  Filled 2022-06-02: qty 8
  Filled 2022-06-02 (×4): qty 6

## 2022-06-02 MED ORDER — INSULIN ASPART 100 UNIT/ML IJ SOLN
12.0000 [IU] | Freq: Every day | INTRAMUSCULAR | Status: DC
  Filled 2022-06-02: qty 0.12

## 2022-06-02 MED ORDER — PANTOPRAZOLE SODIUM 40 MG IV SOLR
40.0000 mg | Freq: Once | INTRAVENOUS | Status: AC
  Administered 2022-06-02: 40 mg via INTRAVENOUS
  Filled 2022-06-02: qty 10

## 2022-06-02 MED ORDER — ONDANSETRON HCL 4 MG/2ML IJ SOLN
4.0000 mg | Freq: Four times a day (QID) | INTRAMUSCULAR | Status: DC | PRN
  Administered 2022-06-03 – 2022-06-05 (×5): 4 mg via INTRAVENOUS
  Filled 2022-06-02 (×5): qty 2

## 2022-06-02 MED ORDER — INSULIN ASPART 100 UNIT/ML IJ SOLN
20.0000 [IU] | Freq: Once | INTRAMUSCULAR | Status: AC
  Administered 2022-06-02: 20 [IU] via SUBCUTANEOUS
  Filled 2022-06-02: qty 0.2

## 2022-06-02 MED ORDER — SODIUM CHLORIDE 0.9 % IV BOLUS
1000.0000 mL | Freq: Once | INTRAVENOUS | Status: AC
  Administered 2022-06-02: 1000 mL via INTRAVENOUS

## 2022-06-02 MED ORDER — ACETAMINOPHEN 325 MG PO TABS
650.0000 mg | ORAL_TABLET | Freq: Four times a day (QID) | ORAL | Status: DC | PRN
  Administered 2022-06-03 – 2022-06-05 (×3): 650 mg via ORAL
  Filled 2022-06-02 (×3): qty 2

## 2022-06-02 MED ORDER — INSULIN GLARGINE-YFGN 100 UNIT/ML ~~LOC~~ SOLN
36.0000 [IU] | Freq: Every day | SUBCUTANEOUS | Status: DC
  Administered 2022-06-02: 36 [IU] via SUBCUTANEOUS
  Filled 2022-06-02 (×2): qty 0.36

## 2022-06-02 MED ORDER — WARFARIN - PHARMACIST DOSING INPATIENT: Freq: Every day | Status: DC

## 2022-06-02 MED ORDER — INSULIN ASPART 100 UNIT/ML FLEXPEN: 12.0000 [IU] | PEN_INJECTOR | Freq: Every day | SUBCUTANEOUS | Status: DC

## 2022-06-02 MED ORDER — WARFARIN SODIUM 7.5 MG PO TABS
7.5000 mg | ORAL_TABLET | Freq: Once | ORAL | Status: AC
  Administered 2022-06-02: 7.5 mg via ORAL
  Filled 2022-06-02: qty 1

## 2022-06-02 NOTE — ED Provider Notes (Signed)
[facility] DEPT Provider Note   CSN: [id number] Arrival date & time: 06/02/22  1334     History  Chief Complaint  Patient presents with   Nausea   Emesis   Hyperglycemia    [name] is a 46 y.o. female.  Patient complains of vomiting for a number of weeks.  Along with worsening swelling in her legs.  She has a history of diabetes hypertension congestive heart failure  The history is provided by the patient and medical records. No language interpreter was used.  Emesis Severity:  Mild Timing:  Constant Quality:  Unable to specify Able to tolerate:  Liquids Progression:  Unchanged Chronicity:  New Recent urination:  Normal Context: not post-tussive   Relieved by:  Nothing Worsened by:  Nothing Ineffective treatments:  None tried Associated symptoms: no abdominal pain, no cough, no diarrhea and no headaches   Hyperglycemia Associated symptoms: vomiting   Associated symptoms: no abdominal pain, no chest pain and no fatigue        Home Medications Prior to Admission medications   Medication Sig Start Date End Date Taking? Authorizing Provider  acetaminophen (TYLENOL) 325 MG tablet Take 2 tablets (650 mg total) by mouth every 6 (six) hours as needed for mild pain (or Fever >/= 101). 02/27/22  Yes Darrick Meigs, Marge Duncans, MD  atorvastatin (LIPITOR) 40 MG tablet TAKE 1 TABLET BY MOUTH DAILY AT 6 PM. Patient taking differently: Take 40 mg by mouth every Monday, Wednesday, and Friday. 04/01/20 08/27/22 Yes Larey Dresser, MD  carvedilol (COREG) 3.125 MG tablet Take 1 tablet (3.125 mg total) by mouth 2 (two) times daily with a meal. 02/27/22  Yes Darrick Meigs, Marge Duncans, MD  empagliflozin (JARDIANCE) 10 MG TABS tablet Take 1 tablet (10 mg total) by mouth daily. 11/08/21  Yes Sharion Settler, DO  furosemide (LASIX) 80 MG tablet Take 1 tablet (80 mg total) by mouth every morning. 05/09/22  Yes Darci Current, DO  hydrOXYzine (ATARAX) 10 MG tablet Take 1 tablet (10  mg total) by mouth 3 (three) times daily as needed. Patient taking differently: Take 10 mg by mouth 3 (three) times daily as needed for anxiety. 10/22/21  Yes Gladys Damme, MD  insulin aspart (NOVOLOG FLEXPEN) 100 UNIT/ML FlexPen Inject 12 Units into the skin daily before supper. Patient taking differently: Inject 36 Units into the skin daily before supper. 09/02/21  Yes Hensel, Jamal Collin, MD  insulin glargine (LANTUS) 100 UNIT/ML Solostar Pen Inject 36 Units into the skin every morning. Patient taking differently: Inject 12 Units into the skin every morning. 09/02/21  Yes Hensel, Jamal Collin, MD  losartan (COZAAR) 25 MG tablet Take 1 tablet (25 mg total) by mouth daily. 02/28/22  Yes Darrick Meigs, Marge Duncans, MD  ondansetron (ZOFRAN-ODT) 4 MG disintegrating tablet Dissolve 1 tablet (4 mg total) by mouth every 8 (eight) hours as needed for nausea or vomiting. 05/09/22  Yes Darci Current, DO  spironolactone (ALDACTONE) 25 MG tablet Take 0.5 tablets (12.5 mg total) by mouth daily. 11/08/21  Yes Sharion Settler, DO  traZODone (DESYREL) 50 MG tablet Take 1 tablet (50 mg total) by mouth at bedtime. 05/09/22  Yes Darci Current, DO  warfarin (COUMADIN) 7.5 MG tablet Take 1-2 tablets (7.5-15 mg total) by mouth daily as directed by [facility] Patient taking differently: Take 7.5-15 mg by mouth See admin instructions. Take 15 mg by mouth every day except for Monday. On Monday's, take 7.5 mg 05/02/22  Yes Branch, Royetta Crochet, MD  Accu-Chek Softclix Lancets lancets Use as instructed 02/12/19   Caroline More, DO  Insulin Pen Needle (UNIFINE PENTIPS) 31G X 5 MM MISC USE AS DIRECTED 2 TIMES DAILY 09/02/21   Zenia Resides, MD  pantoprazole (PROTONIX) 40 MG tablet Take 1 tablet (40 mg total) by mouth daily. Patient not taking: Reported on 05/06/2022 02/27/22   Oswald Hillock, MD      Allergies    Shellfish allergy, Metformin and related, Trulicity [dulaglutide], Nsaids, and Advil [ibuprofen]    Review of Systems   Review of  Systems  Constitutional:  Negative for appetite change and fatigue.  HENT:  Negative for congestion, ear discharge and sinus pressure.   Eyes:  Negative for discharge.  Respiratory:  Negative for cough.   Cardiovascular:  Negative for chest pain.  Gastrointestinal:  Positive for vomiting. Negative for abdominal pain and diarrhea.  Genitourinary:  Negative for frequency and hematuria.  Musculoskeletal:  Negative for back pain.       Edema in legs  Skin:  Negative for rash.  Neurological:  Negative for seizures and headaches.  Psychiatric/Behavioral:  Negative for hallucinations.     Physical Exam Updated Vital Signs BP (!) 164/127   Pulse 94   Temp 98 F (36.7 C)   Resp 16   SpO2 100%  Physical Exam Vitals and nursing note reviewed.  Constitutional:      Appearance: She is well-developed.  HENT:     Head: Normocephalic.     Nose: Nose normal.  Eyes:     General: No scleral icterus.    Conjunctiva/sclera: Conjunctivae normal.  Neck:     Thyroid: No thyromegaly.  Cardiovascular:     Rate and Rhythm: Normal rate and regular rhythm.     Heart sounds: No murmur heard.    No friction rub. No gallop.  Pulmonary:     Breath sounds: No stridor. No wheezing or rales.  Chest:     Chest wall: No tenderness.  Abdominal:     General: There is no distension.     Tenderness: There is no abdominal tenderness. There is no rebound.  Musculoskeletal:        General: Normal range of motion.     Cervical back: Neck supple.     Comments: 3+ edema all the way up her legs  Lymphadenopathy:     Cervical: No cervical adenopathy.  Skin:    Findings: No erythema or rash.  Neurological:     Mental Status: She is oriented to person, place, and time.     Motor: No abnormal muscle tone.     Coordination: Coordination normal.  Psychiatric:        Behavior: Behavior normal.     ED Results / Procedures / Treatments   Labs (all labs ordered are listed, but only abnormal results are  displayed) Labs Reviewed  CBC - Abnormal; Notable for the following components:      Result Value   RBC 5.31 (*)    Hemoglobin 11.3 (*)    MCV 69.7 (*)    MCH 21.3 (*)    RDW 20.1 (*)    All other components within normal limits  COMPREHENSIVE METABOLIC PANEL - Abnormal; Notable for the following components:   Sodium 127 (*)    Chloride 92 (*)    Glucose, Bld 543 (*)    Calcium 8.5 (*)    Albumin 2.7 (*)    Alkaline Phosphatase 176 (*)    Total Bilirubin 1.9 (*)  All other components within normal limits  BRAIN NATRIURETIC PEPTIDE - Abnormal; Notable for the following components:   B Natriuretic Peptide 758.1 (*)    All other components within normal limits  PROTIME-INR - Abnormal; Notable for the following components:   Prothrombin Time 34.5 (*)    INR 3.5 (*)    All other components within normal limits  URINALYSIS, ROUTINE W REFLEX MICROSCOPIC - Abnormal; Notable for the following components:   Glucose, UA >=500 (*)    Protein, ur 30 (*)    Leukocytes,Ua TRACE (*)    All other components within normal limits  CBG MONITORING, ED - Abnormal; Notable for the following components:   Glucose-Capillary 579 (*)    All other components within normal limits  TROPONIN I (HIGH SENSITIVITY) - Abnormal; Notable for the following components:   Troponin I (High Sensitivity) 38 (*)    All other components within normal limits  APTT  I-STAT BETA HCG BLOOD, ED ([facility], [facility], [facility] ONLY)  TROPONIN I (HIGH SENSITIVITY)    EKG EKG Interpretation  Date/Time:  Thursday June 02 2022 14:40:35 EST Ventricular Rate:  93 PR Interval:  142 QRS Duration: 91 QT Interval:  367 QTC Calculation: 457 R Axis:   -26 Text Interpretation: Sinus rhythm Left atrial enlargement Borderline left axis deviation Nonspecific T abnormalities, lateral leads Confirmed by Milton Ferguson [phone]) on 06/02/2022 6:24:49 PM  Radiology DG Chest 2 View  Result Date: 06/02/2022 CLINICAL DATA:  Chest pain and shortness  of breath. EXAM: CHEST - 2 VIEW COMPARISON:  05/06/2022 CTA chest.  05/05/2022 chest radiograph. FINDINGS: Both views are degraded by patient body habitus and low lung volumes. Midline trachea. Cardiomegaly accentuated by AP portable technique. No right and no definite left pleural effusion. No pneumothorax. Low lung volumes with resultant pulmonary interstitial prominence. No lobar consolidation. IMPRESSION: Mild limitations as detailed above. Cardiomegaly and low lung volumes, without definite acute disease. Electronically Signed   By: Abigail Miyamoto M.D.   On: 06/02/2022 14:46    Procedures Procedures    Medications Ordered in ED Medications  ondansetron (ZOFRAN) injection 4 mg (4 mg Intravenous Given 06/02/22 1741)  morphine (PF) 2 MG/ML injection 2 mg (2 mg Intravenous Given 06/02/22 1741)  sodium chloride 0.9 % bolus 1,000 mL (0 mLs Intravenous Stopped 06/02/22 1854)  insulin aspart (novoLOG) injection 20 Units (20 Units Subcutaneous Given 06/02/22 1852)  furosemide (LASIX) injection 40 mg (40 mg Intravenous Given 06/02/22 1851)  pantoprazole (PROTONIX) injection 40 mg (40 mg Intravenous Given 06/02/22 1851)    ED Course/ Medical Decision Making/ A&P                           Medical Decision Making Risk Prescription drug management. Decision regarding hospitalization.  This patient presents to the ED for concern of pelvic pain and swelling, this involves an extensive number of treatment options, and is a complaint that carries with it a high risk of complications and morbidity.  The differential diagnosis includes heart failure, liver failure   Co morbidities that complicate the patient evaluation  Diabetes hypertension   Additional history obtained:  Additional history obtained from patient External records from outside source obtained and reviewed including hospital records   Lab Tests:  I Ordered, and personally interpreted labs.  The pertinent results include: Glucose 543,  sodium 127, BNP 758   Imaging Studies ordered:  I ordered imaging studies including chest x-ray I independently visualized and interpreted imaging  which showed cardiomegaly I agree with the radiologist interpretation   Cardiac Monitoring: / EKG:  The patient was maintained on a cardiac monitor.  I personally viewed and interpreted the cardiac monitored which showed an underlying rhythm of: Normal sinus rhythm   Consultations Obtained:  I requested consultation with the hospitalist,  and discussed lab and imaging findings as well as pertinent plan - they recommend: Admit   Problem List / ED Course / Critical interventions / Medication management  Diabetes and anasarca I ordered medication including Lasix for anasarca Reevaluation of the patient after these medicines showed that the patient stayed the same I have reviewed the patients home medicines and have made adjustments as needed   Social Determinants of Health:  None   Test / Admission - Considered:  No additional test in ED  Patient will be admitted for anasarca from congestive heart failure along with poorly controlled blood pressure and poorly controlled diabetes with vomiting        Final Clinical Impression(s) / ED Diagnoses Final diagnoses:  Anasarca  Primary hypertension    Rx / DC Orders ED Discharge Orders     None         Milton Ferguson, MD 06/04/22 1119

## 2022-06-02 NOTE — Progress Notes (Signed)
ANTICOAGULATION CONSULT NOTE - Initial Consult  Pharmacy Consult for warfarin Indication: LV thrombus  Allergies  Allergen Reactions   Shellfish Allergy Swelling    Airway involvement including EMS - Has Epi-Pen   Metformin And Related Diarrhea   Trulicity [Dulaglutide] Nausea And Vomiting and Other (See Comments)    Multiple episodes of vomiting over multiple days   Nsaids Other (See Comments)    Per PCP interferes with daily meds (heart failure)   Advil [Ibuprofen] Other (See Comments)    Per PCP interferes with daily meds (heart failure)    Patient Measurements:   Heparin Dosing Weight:   Vital Signs: Temp: 98 F (36.7 C) (11/09 1747) Temp Source: Oral (11/09 1346) BP: 167/129 (11/09 2100) Pulse Rate: 102 (11/09 2100)  Labs: Recent Labs    06/02/22 1613  HGB 11.3*  HCT 37.0  PLT 283  APTT 33  LABPROT 34.5*  INR 3.5*  CREATININE 0.92  TROPONINIHS 38*    CrCl cannot be calculated (Unknown ideal weight.).   Medical History: Past Medical History:  Diagnosis Date   Abnormal Pap smear    Acute heart failure ([place]) 01/29/2022   Acute on chronic combined systolic and diastolic CHF (congestive heart failure) ([facility]) 02/19/2019   Acute on chronic congestive heart failure ([facility])    Acute on chronic congestive heart failure ([facility])    Acute on chronic diastolic CHF (congestive heart failure) ([facility]) 10/19/2021   Acute on chronic systolic heart failure ([facility]) 02/22/2022   Anemia    Asthma    Atrial fibrillation ([place])    history of paroxysmal atrial fibrillation   CHF (congestive heart failure) ([place]) 11/02/2021   CHF exacerbation ([place]) 10/17/2021   Diabetes mellitus    DKA, type 2 ([facility]) 02/06/2019   Elevated troponin 09/30/2019   Heart failure with reduced ejection fraction ([place])    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  Consider chronic etiology given continued fertility issues   Hypertension    Hypertensive  emergency 09/30/2019   Major depressive disorder 02/08/2012   Reports that 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     Migraines    Morbid obesity ([place]) 02/06/2019   Nonischemic cardiomyopathy ([place]) 09/22/2021   NSTEMI (non-ST elevated myocardial infarction) ([place]) 02/23/2022   Obstructive sleep apnea 06/30/2015   Polycystic ovarian disease     Medications:  Warfarin 15 mg everyday except for Monday, take 7.5 mg on Monday Last dose 11/8 at 2045   Assessment: 46 yo F on warfarin for LV thrombus. Pharmacy to dose while in patient. Admit INR: 3.5 - slightly above goal.  Hg 11.3, PLT WNL.   Goal of Therapy:  INR 2-3 Monitor platelets by anticoagulation protocol: Yes   Plan:  Give warfarin 7.5 mg po x 1 dose tonight Daily INR  Eudelia Bunch, Pharm.D Use secure chat for questions 06/02/2022 9:21 PM

## 2022-06-02 NOTE — H&P (Signed)
History and Physical    [name] TKP:[id number] DOB: 1976-06-07 DOA: 06/02/2022  PCP: Donney Dice, DO  Patient coming from: home  I have personally briefly reviewed patient's old medical records in [place]  Chief Complaint: n/v/4 weeks worse over the last 24 hours  B/l leg swelling   HPI: [name] is a 46 y.o. 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 Of note evaluation  at that time was negative and patient was seen by cardiology who cleared pain for discharge home with cardiology follow up.Patient presents to ED BIB EMS due to persistent n/v worse over the last 24 hours, b/l leg swelling, uncontrolled blood pressure and hyperglycemia. Per patient she has not taken any of her medications in 3 days. She states that she has been very fatigued and noticed swelling has progressively gotten worse.  She notes intermittent chest discomfort as well, but none now. She notes that she continue to have intermittent nausea as well as HA. She however denies sob, abdominal pain, dysuria, diarrhea, cough or fever.   ED Course:  Patient noted to have significant whole body anasarca on Evaluation per EDP.   Vitals:afeb, bp 153/115, hr 97, rr 16 sat 98% -100%  Labs: Ua:+glucose no ketones  trace LE wbc 6-10  Cxr: Cardiomegaly and low lung volumes, without definite acute disease.   EKG: Sinus  nonspecific T abn  Wbc 6.4, hgb 11.3 (10.7) mcv 69 Inr 3.5 CE38  Na 127(132), K 4.3, glu 543, cr 0.92, alkphos:176, Tbili 1.9 BNP 758.1  Zofran '4mg'$   Morphine '2mg'$   NS 1L Zofran Protonix 40 mg  Lasix 40 mg  Insulin 20 untis   Review of Systems: As  per HPI otherwise 10 point review of systems negative.   Past Medical History:  Diagnosis Date   Abnormal Pap smear    Acute heart failure ([place]) 01/29/2022   Acute on chronic combined systolic and diastolic CHF (congestive heart failure) ([place]) 02/19/2019   Acute on chronic congestive heart failure ([facility])    Acute on chronic congestive heart failure (H[facility])    Acute on chronic diastolic CHF (congestive heart failure) ([place]) 10/19/2021   Acute on chronic systolic heart failure ([place]) 02/22/2022   Anemia    Asthma    Atrial fibrillation ([place])    history of paroxysmal atrial fibrillation   CHF (congestive heart failure) ([place]) 11/02/2021   CHF exacerbation ([place]) 10/17/2021   Diabetes mellitus    DKA, type 2 ([place]) 02/06/2019   Elevated troponin 09/30/2019   Heart failure with reduced ejection fraction ([place])    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  Consider chronic etiology given continued fertility issues   Hypertension    Hypertensive emergency 09/30/2019   Major depressive disorder 02/08/2012   Reports that 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     Migraines    Morbid obesity ([place]) 02/06/2019   Nonischemic cardiomyopathy ([place]) 09/22/2021   NSTEMI (non-ST elevated myocardial infarction) ([place]) 02/23/2022   Obstructive sleep apnea 06/30/2015   Polycystic ovarian disease  Past Surgical History:  Procedure Laterality Date   BIOPSY  11/05/2021   Procedure: BIOPSY;  Surgeon: Daryel November, MD;  [facility] ENDOSCOPY;  Service: Gastroenterology;;   CERVIX LESION DESTRUCTION     CESAREAN SECTION     ESOPHAGOGASTRODUODENOSCOPY (EGD) WITH PROPOFOL N/A 11/05/2021   Procedure: ESOPHAGOGASTRODUODENOSCOPY (EGD) WITH PROPOFOL;  Surgeon: Daryel November, MD;  Location: [facility];  Service: Gastroenterology;  Laterality: N/A;   RIGHT/LEFT HEART CATH AND CORONARY ANGIOGRAPHY  N/A 03/29/2019   Procedure: RIGHT/LEFT HEART CATH AND CORONARY ANGIOGRAPHY;  Surgeon: Larey Dresser, MD;  Location: [facility] CV LAB;  Service: Cardiovascular;  Laterality: N/A;   RIGHT/LEFT HEART CATH AND CORONARY ANGIOGRAPHY N/A 09/20/2021   Procedure: RIGHT/LEFT HEART CATH AND CORONARY ANGIOGRAPHY;  Surgeon: Larey Dresser, MD;  Location: [facility] CV LAB;  Service: Cardiovascular;  Laterality: N/A;     reports that she has never smoked. She has never used smokeless tobacco. She reports that she does not drink alcohol and does not use drugs.  Allergies  Allergen Reactions   Shellfish Allergy Swelling    Airway involvement including EMS - Has Epi-Pen   Metformin And Related Diarrhea   Trulicity [Dulaglutide] Nausea And Vomiting and Other (See Comments)    Multiple episodes of vomiting over multiple days   Nsaids Other (See Comments)    Per PCP interferes with daily meds (heart failure)   Advil [Ibuprofen] Other (See Comments)    Per PCP interferes with daily meds (heart failure)    Family History  Problem Relation Age of Onset   Diabetes Mother    Hypertension Mother    Hypertension Father    Diabetes Father     Prior to Admission medications   Medication Sig Start Date End Date Taking? Authorizing Provider  acetaminophen (TYLENOL) 325 MG tablet Take 2 tablets (650 mg total) by mouth every 6 (six) hours as needed for mild pain (or Fever >/= 101). 02/27/22  Yes Darrick Meigs, Marge Duncans, MD  atorvastatin (LIPITOR) 40 MG tablet TAKE 1 TABLET BY MOUTH DAILY AT 6 PM. Patient taking differently: Take 40 mg by mouth every Monday, Wednesday, and Friday. 04/01/20 08/27/22 Yes Larey Dresser, MD  carvedilol (COREG) 3.125 MG tablet Take 1 tablet (3.125 mg total) by mouth 2 (two) times daily with a meal. 02/27/22  Yes Darrick Meigs, Marge Duncans, MD  empagliflozin (JARDIANCE) 10 MG TABS tablet Take 1 tablet (10 mg total) by mouth daily. 11/08/21  Yes Sharion Settler, DO  furosemide (LASIX) 80 MG tablet Take 1  tablet (80 mg total) by mouth every morning. 05/09/22  Yes Darci Current, DO  hydrOXYzine (ATARAX) 10 MG tablet Take 1 tablet (10 mg total) by mouth 3 (three) times daily as needed. Patient taking differently: Take 10 mg by mouth 3 (three) times daily as needed for anxiety. 10/22/21  Yes Gladys Damme, MD  insulin aspart (NOVOLOG FLEXPEN) 100 UNIT/ML FlexPen Inject 12 Units into the skin daily before supper. Patient taking differently: Inject 36 Units into the skin daily before supper. 09/02/21  Yes Hensel, Jamal Collin, MD  insulin glargine (LANTUS) 100 UNIT/ML Solostar Pen Inject 36 Units into the skin every morning. Patient taking differently: Inject 12 Units into the skin every morning. 09/02/21  Yes Hensel, Jamal Collin, MD  losartan (COZAAR) 25 MG tablet Take 1 tablet (25 mg total) by mouth daily. 02/28/22  Yes Lama, Marge Duncans, MD  ondansetron (ZOFRAN-ODT) 4 MG disintegrating tablet Dissolve 1 tablet (4 mg total) by mouth every  8 (eight) hours as needed for nausea or vomiting. 05/09/22  Yes Darci Current, DO  spironolactone (ALDACTONE) 25 MG tablet Take 0.5 tablets (12.5 mg total) by mouth daily. 11/08/21  Yes Sharion Settler, DO  traZODone (DESYREL) 50 MG tablet Take 1 tablet (50 mg total) by mouth at bedtime. 05/09/22  Yes Darci Current, DO  warfarin (COUMADIN) 7.5 MG tablet Take 1-2 tablets (7.5-15 mg total) by mouth daily as directed by [facility] Patient taking differently: Take 7.5-15 mg by mouth See admin instructions. Take 15 mg by mouth every day except for Monday. On Monday's, take 7.5 mg 05/02/22  Yes Janina Mayo, MD  Accu-Chek Softclix Lancets lancets Use as instructed 02/12/19   Caroline More, DO  Insulin Pen Needle (UNIFINE PENTIPS) 31G X 5 MM MISC USE AS DIRECTED 2 TIMES DAILY 09/02/21   Zenia Resides, MD  pantoprazole (PROTONIX) 40 MG tablet Take 1 tablet (40 mg total) by mouth daily. Patient not taking: Reported on 05/06/2022 02/27/22   Oswald Hillock, MD    Physical  Exam: Vitals:   06/02/22 1830 06/02/22 1900 06/02/22 1930 06/02/22 2000  BP: (!) 158/123 (!) 159/124 (!) 162/128 (!) 164/127  Pulse: 96 93 95 94  Resp: '16 16 16 16  '$ Temp:      TempSrc:      SpO2: 100% 100% 100% 100%    Constitutional: NAD, calm,ill appearing Vitals:   06/02/22 1830 06/02/22 1900 06/02/22 1930 06/02/22 2000  BP: (!) 158/123 (!) 159/124 (!) 162/128 (!) 164/127  Pulse: 96 93 95 94  Resp: '16 16 16 16  '$ Temp:      TempSrc:      SpO2: 100% 100% 100% 100%   Eyes: PERRL, lids and conjunctivae normal ENMT: Mucous membranes are moist. Posterior pharynx clear of any exudate or lesions.Normal dentition.  Neck: normal, supple, no masses, no thyromegaly Respiratory: clear to auscultation bilaterally, no wheezing, no crackles. Normal respiratory effort. No accessory muscle use.  Cardiovascular: Regular rate and rhythm, no murmurs / rubs / gallops. +3 extremity edema. 2+ pedal pulses.  Abdomen: no tenderness, no masses palpated. No hepatosplenomegaly. Bowel sounds positive.  Musculoskeletal: no clubbing / cyanosis. No joint deformity upper and lower extremities. Good ROM, no contractures. Normal muscle tone.  Skin: no rashes, lesions, ulcers. No induration Neurologic: CN 2-12 grossly intact. Sensation intact, Strength 4+-5/5 in all 4.  Psychiatric: Normal judgment and insight. Alert and oriented x 3. Normal mood.    Labs on Admission: I have personally reviewed following labs and imaging studies  CBC: Recent Labs  Lab 06/02/22 1613  WBC 6.4  HGB 11.3*  HCT 37.0  MCV 69.7*  PLT 650   Basic Metabolic Panel: Recent Labs  Lab 06/02/22 1613  NA 127*  K 4.3  CL 92*  CO2 24  GLUCOSE 543*  BUN 11  CREATININE 0.92  CALCIUM 8.5*   GFR: CrCl cannot be calculated (Unknown ideal weight.). Liver Function Tests: Recent Labs  Lab 06/02/22 1613  AST 22  ALT 15  ALKPHOS 176*  BILITOT 1.9*  PROT 7.1  ALBUMIN 2.7*   No results for input(s): "LIPASE", "AMYLASE" in  the last 168 hours. No results for input(s): "AMMONIA" in the last 168 hours. Coagulation Profile: Recent Labs  Lab 06/02/22 1613  INR 3.5*   Cardiac Enzymes: No results for input(s): "CKTOTAL", "CKMB", "CKMBINDEX", "TROPONINI" in the last 168 hours. BNP (last 3 results) No results for input(s): "PROBNP" in the last 8760 hours. HbA1C: No results  for input(s): "HGBA1C" in the last 72 hours. CBG: Recent Labs  Lab 06/02/22 1346  GLUCAP 579*   Lipid Profile: No results for input(s): "CHOL", "HDL", "LDLCALC", "TRIG", "CHOLHDL", "LDLDIRECT" in the last 72 hours. Thyroid Function Tests: No results for input(s): "TSH", "T4TOTAL", "FREET4", "T3FREE", "THYROIDAB" in the last 72 hours. Anemia Panel: No results for input(s): "VITAMINB12", "FOLATE", "FERRITIN", "TIBC", "IRON", "RETICCTPCT" in the last 72 hours. Urine analysis:    Component Value Date/Time   COLORURINE YELLOW 06/02/2022 1409   APPEARANCEUR CLEAR 06/02/2022 1409   LABSPEC 1.029 06/02/2022 1409   PHURINE 6.0 06/02/2022 1409   GLUCOSEU >=500 (A) 06/02/2022 1409   HGBUR NEGATIVE 06/02/2022 1409   BILIRUBINUR NEGATIVE 06/02/2022 1409   BILIRUBINUR NEG 04/05/2013 1057   KETONESUR NEGATIVE 06/02/2022 1409   PROTEINUR 30 (A) 06/02/2022 1409   UROBILINOGEN 0.2 08/09/2013 2031   NITRITE NEGATIVE 06/02/2022 1409   LEUKOCYTESUR TRACE (A) 06/02/2022 1409    Radiological Exams on Admission: DG Chest 2 View  Result Date: 06/02/2022 CLINICAL DATA:  Chest pain and shortness of breath. EXAM: CHEST - 2 VIEW COMPARISON:  05/06/2022 CTA chest.  05/05/2022 chest radiograph. FINDINGS: Both views are degraded by patient body habitus and low lung volumes. Midline trachea. Cardiomegaly accentuated by AP portable technique. No right and no definite left pleural effusion. No pneumothorax. Low lung volumes with resultant pulmonary interstitial prominence. No lobar consolidation. IMPRESSION: Mild limitations as detailed above. Cardiomegaly and  low lung volumes, without definite acute disease. Electronically Signed   By: Abigail Miyamoto M.D.   On: 06/02/2022 14:46    EKG: Independently reviewed. See above   Assessment/Plan Hypertensive Urgency  -associated n/v/ supportive care with zofran  -admit to progressive care  -place nitro paste  -resume oral anti-hypertensive medications  -prn hydralazine  -start on nitro drip if bp not improved with above treatment  -neuro checks  -continue cycle ce  -supportive care    Mild Acute on chronic exacerbation of CHF ref  -insetting of Hypertensive Urgency  -ef 20% -noted b/l lower extremity swelling  - start lasix iv bid  -if no  improvement can consider repeating echo   CAD s/p MI  -s/p MI 8/23  -resume GDMT    Afib  -currently in sinus  - resume warfarin   LV Thrombus -continue with warfin  -inr 3  -pharmacy to dose   OSA -untreated  -cpap as able  -possible additional cause for lower extremity swelling   DMII, uncontrolled -noted signficant hyperglycemia -check A1c  -start on iss/fs  -resume long acting  -s/p 20 units short acting in Ed - of note no GAP    DVT prophylaxis:  warfarin  Code Status: full Family Communication: n/a Disposition Plan: patient  expected to be admitted greater than 2 midnights  Consults called: consider cardiology consult in am  Admission status: progressive care    Clance Boll MD [facility]   If 7PM-7AM, please contact night-coverage [email or web address] Password [facility]  06/02/2022, 8:35 PM

## 2022-06-02 NOTE — ED Triage Notes (Signed)
BIBA Per EMS; Pt coming from home w/ c/o N/V x 4 weeks. Pt also reports bilateral lower leg swelling. Takes lasix. Been seen multiple times for same w/ no answers.  CBG reading HI 40 RR 146/166 HR 96 100% RA

## 2022-06-02 NOTE — ED Provider Triage Note (Signed)
Emergency Medicine Provider Triage Evaluation Note  [name] , a 46 y.o. female  was evaluated in triage.  Pt complains of nausea, vomiting, abdominal pain, chest pain and shortness of breath.  She states these have been going on for awhile but have gotten worse.  She also has lower leg edema and feels like she is retaining fluid in her abdomen also.  She has not been able to take her medicines as prescribed due to N/V and weakness.  Denies fevers, chills.    Review of Systems  Positive: See above  Negative: See above  Physical Exam  BP (!) 153/115   Pulse 97   Temp 98.9 F (37.2 C) (Oral)   Resp 16   SpO2 98%  Gen:   Awake, no distress, appears uncomfortable Resp:  Normal effort, tachypneic, unable to take deep breath, lung sounds clear MSK:   Moves extremities without difficulty, bilateral pitting lower edema Other:  Generalized abdominal tenderness on palpation  Medical Decision Making  Medically screening exam initiated at 2:04 PM.  Appropriate orders placed.  [name] was informed that the remainder of the evaluation will be completed by another provider, this initial triage assessment does not replace that evaluation, and the importance of remaining in the ED until their evaluation is complete.     Theressa Stamps R, [place] 06/02/22 [id number]

## 2022-06-03 DIAGNOSIS — I160 Hypertensive urgency: Secondary | ICD-10-CM | POA: Diagnosis not present

## 2022-06-03 LAB — COMPREHENSIVE METABOLIC PANEL
ALT: 13 U/L (ref 0–44)
AST: 19 U/L (ref 15–41)
Albumin: 2.5 g/dL — ABNORMAL LOW (ref 3.5–5.0)
Alkaline Phosphatase: 168 U/L — ABNORMAL HIGH (ref 38–126)
Anion gap: 8 (ref 5–15)
BUN: 11 mg/dL (ref 6–20)
CO2: 26 mmol/L (ref 22–32)
Calcium: 8.5 mg/dL — ABNORMAL LOW (ref 8.9–10.3)
Chloride: 97 mmol/L — ABNORMAL LOW (ref 98–111)
Creatinine, Ser: 0.8 mg/dL (ref 0.44–1.00)
GFR, Estimated: >60 mL/min (ref >60)
Glucose, Bld: 197 mg/dL — ABNORMAL HIGH (ref 70–99)
Potassium: 3.5 mmol/L (ref 3.5–5.1)
Sodium: 131 mmol/L — ABNORMAL LOW (ref 135–145)
Total Bilirubin: 1.6 mg/dL — ABNORMAL HIGH (ref 0.3–1.2)
Total Protein: 7 g/dL (ref 6.5–8.1)

## 2022-06-03 LAB — CBC
HCT: 36.6 % (ref 36.0–46.0)
Hemoglobin: 11.3 g/dL — ABNORMAL LOW (ref 12.0–15.0)
MCH: 21.3 pg — ABNORMAL LOW (ref 26.0–34.0)
MCHC: 30.9 g/dL (ref 30.0–36.0)
MCV: 68.9 fL — ABNORMAL LOW (ref 80.0–100.0)
Platelets: 309 10*3/uL (ref 150–400)
RBC: 5.31 MIL/uL — ABNORMAL HIGH (ref 3.87–5.11)
RDW: 19.7 % — ABNORMAL HIGH (ref 11.5–15.5)
WBC: 7.3 10*3/uL (ref 4.0–10.5)
nRBC: 0 % (ref 0.0–0.2)

## 2022-06-03 LAB — PROTIME-INR
INR: 4.3 (ref 0.8–1.2)
Prothrombin Time: 40.7 seconds — ABNORMAL HIGH (ref 11.4–15.2)

## 2022-06-03 LAB — CBG MONITORING, ED
Glucose-Capillary: 179 mg/dL — ABNORMAL HIGH (ref 70–99)
Glucose-Capillary: 119 mg/dL — ABNORMAL HIGH (ref 70–99)

## 2022-06-03 LAB — GLUCOSE, CAPILLARY
Glucose-Capillary: 103 mg/dL — ABNORMAL HIGH (ref 70–99)
Glucose-Capillary: 148 mg/dL — ABNORMAL HIGH (ref 70–99)

## 2022-06-03 MED ORDER — INSULIN GLARGINE-YFGN 100 UNIT/ML ~~LOC~~ SOLN
15.0000 [IU] | Freq: Every day | SUBCUTANEOUS | Status: DC
  Administered 2022-06-03 – 2022-06-04 (×2): 15 [IU] via SUBCUTANEOUS
  Filled 2022-06-03 (×2): qty 0.15

## 2022-06-03 MED ORDER — IBUPROFEN 800 MG PO TABS
800.0000 mg | ORAL_TABLET | Freq: Four times a day (QID) | ORAL | Status: DC | PRN
  Administered 2022-06-03: 800 mg via ORAL
  Filled 2022-06-03: qty 1

## 2022-06-03 MED ORDER — TRAMADOL HCL 50 MG PO TABS
50.0000 mg | ORAL_TABLET | Freq: Once | ORAL | Status: AC
  Administered 2022-06-03: 50 mg via ORAL
  Filled 2022-06-03: qty 1

## 2022-06-03 MED ORDER — TRAMADOL HCL 50 MG PO TABS
50.0000 mg | ORAL_TABLET | Freq: Four times a day (QID) | ORAL | Status: DC | PRN
  Filled 2022-06-03: qty 1

## 2022-06-03 NOTE — Progress Notes (Signed)
[place] for warfarin Indication: LV thrombus  Allergies  Allergen Reactions   Shellfish Allergy Swelling    Airway involvement including EMS - Has Epi-Pen   Metformin And Related Diarrhea   Trulicity [Dulaglutide] Nausea And Vomiting and Other (See Comments)    Multiple episodes of vomiting over multiple days   Nsaids Other (See Comments)    Per PCP interferes with daily meds (heart failure)   Advil [Ibuprofen] Other (See Comments)    Per PCP interferes with daily meds (heart failure)    Patient Measurements:   Heparin Dosing Weight:   Vital Signs: Temp: 98.5 F (36.9 C) (11/10 0700) BP: 135/96 (11/10 0900) Pulse Rate: 91 (11/10 0900)  Labs: Recent Labs    06/02/22 1613 06/02/22 2136 06/03/22 0419  HGB 11.3*  --  11.3*  HCT 37.0  --  36.6  PLT 283  --  309  APTT 33  --   --   LABPROT 34.5*  --  40.7*  INR 3.5*  --  4.3*  CREATININE 0.92  --  0.80  TROPONINIHS 38* 31*  --      CrCl cannot be calculated (Unknown ideal weight.).  Medications:  Warfarin 15 mg everyday except for Monday, take 7.5 mg on Monday Last dose 11/8 at 2045   Assessment: 46 yo F on warfarin for LV thrombus. Pharmacy to dose while in patient. Admit INR: 3.5 - slightly above goal.  Hg 11.3, PLT WNL.   06/03/2022 INR 4.3 - above goal. Hg 11.3 - stable, PLT WNL  No bleeding reported Goal of Therapy:  INR 2-3 Monitor platelets by anticoagulation protocol: Yes   Plan:  No warfarin today Daily INR  Eudelia Bunch, Pharm.D Use secure chat for questions 06/03/2022 10:21 AM

## 2022-06-03 NOTE — ED Notes (Signed)
Pt requesting nausea meds prior to breakfast. Will admin PRN order

## 2022-06-03 NOTE — ED Notes (Signed)
Offered pt tylenol for pain and patient refused. MD aware

## 2022-06-03 NOTE — Progress Notes (Signed)
Patient refuses CPAP for tonight. Encouraged pt to call if she changes her mind.

## 2022-06-03 NOTE — Progress Notes (Signed)
PROGRESS NOTE    [name]  FMB:[id number] DOB: 1976-04-11 DOA: 06/02/2022 PCP: Donney Dice, DO    Brief Narrative:   [name] is a 46 y.o. 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  Patient with multiple ED visits since 9/23 for similar complaints, last evaluation on 10/14 in which she complained of chest pain and was evaluated by cardiology and subsequently discharged home.  Patient reports that she has been unable to take her medications over the last 3 days due to the nausea and vomiting.  Also reports that she has been very fatigued.  Patient endorses headache but denies any chest discomfort at this time.  Further denies abdominal pain, no dysuria, no diarrhea, no cough or fever.  In the ED, Temperature 98.9 F, HR 97, RR 16, BP 176/136, SPO2 98% on room air.  Sodium 127, potassium 4.3, chloride 92, CO2 24, glucose 543, BUN 11, creatinine 0.92, AST 22, ALT 15, total bilirubin 1.9.  WBC 6.4, hemoglobin 11.3, platelets 283.  BNP 758.1.  High sensitive troponin 38>31.  Assessment & Plan:   Hypertensive urgency Hx Essential hypertension Blood pressure elevated 176/136 on admission, noncompliant with home medications. -- Carvedilol 3.125 mg p.o. twice daily -- Losartan 25 mg p.o. daily -- Spironolactone 12.5 mg p.o. daily -- Hydralazine 10 mg IV as needed -- Continue monitor BP closely  Acute on chronic combined systolic/diastolic congestive heart failure exacerbation Hx nonischemic cardiomyopathy Patient presenting with progressive shortness of breath, lower extremity edema.  BNP elevated on admission.  Chest x-ray with  cardiomegaly.  Patient has been noncompliant with her home medications.  Recent TTE 02/2022 with LVEF less than 20%, LV with severely decreased function, LV with global hypokinesis, LV cavity mildly dilated, grade 3 diastolic dysfunction, LA mildly dilated, mild MR, moderate TR, IVC normal in size. -- Continue carvedilol, losartan, spironolactone, statin -- Furosemide 60 mg IV every 12 hours -- Fluid restriction 1800 mL per day -- Strict I's and O's and daily weights  Elevated troponin Troponin elevated 38 followed by 31 on admission.  EKG with normal sinus rhythm, no dynamic changes.  Patient denies chest pain.  Etiology likely secondary to type II demand ischemia in the setting of volume overload/CHF exacerbation. -- Continue monitor on telemetry  Hyponatremia Patient presenting with a sodium of 127, likely secondary to hypervolemic hyponatremia in the setting of volume overload/CHF exacerbation. -- Na 127>131 -- Continue IV diuresis as above -- Fluid restriction 1800 mL per day -- BMP daily  History of left ventricular thrombus on anticoagulation  TTE 02/2022 with LV thrombus present at apex 2.2 x 1.4 cm with some mobility. -- Continue Coumadin, pharmacy consulted for dosing/monitoring -- INR daily  Paroxysmal atrial fibrillation Currently in normal sinus rhythm. -- Carvedilol 3.125 mg p.o. twice daily -- Coumadin, pharmacy consulted for dosing/monitoring -- INR daily  Type 2 diabetes mellitus, with hyperglycemia Hemoglobin A1c 12.1 on 01/29/2022, poorly controlled. -- repeat Hemoglobin A1C in am -- Semglee 15 units [place] daily -- Jardiance 10 mg p.o. daily -- Moderate SSI for coverage -- CBGs before every meal/at bedtime  Hyperlipidemia -- Atorvastatin 40 mg p.o. Monday/Wednesday/Friday  Obstructive sleep apnea Untreated outpatient.  Likely contributing factor to her poorly controlled blood  pressure and heart failure. -- CPAP nightly -- Will need outpatient sleep study on  discharge  Morbid obesity Discussed with patient needs for aggressive lifestyle changes/weight loss as this complicates all facets of care.  Outpatient follow-up with PCP.      DVT prophylaxis: Coumadin    Code Status: Full Code Family Communication: No family was at bedside this morning  Disposition Plan:  Level of care: Telemetry Status is: Inpatient Remains inpatient appropriate because: IV diuresis    Consultants:  None  Procedures:  None  Antimicrobials:  None   Subjective: Patient seen examined bedside, resting comfortably.  Remains in ED holding area.  Lying in bed.  Continues with some mild nausea but no vomiting.  Discussed that she needs better control of her chronic comorbidities as likely all-encompassing her current complaints.  No other specific questions or concerns at this time.  Denies headache, no dizziness, no chest pain, no palpitations, no shortness of breath, no abdominal pain, no focal weakness, no fever/chills/night sweats, no cough/congestion, no paresthesias.  No acute events overnight per nursing staff.  Objective: Vitals:   06/03/22 0900 06/03/22 0930 06/03/22 1100 06/03/22 1130  BP: (!) 135/96 (!) 124/91 (!) 128/96 (!) 125/96  Pulse: 91 91 92 90  Resp: '20 20 20 20  '$ Temp:  98.2 F (36.8 C)    TempSrc:      SpO2: 99% 100% 98% 100%    Intake/Output Summary (Last 24 hours) at 06/03/2022 1246 Last data filed at 06/03/2022 0908 Gross per 24 hour  Intake 1360 ml  Output 3600 ml  Net -2240 ml   There were no vitals filed for this visit.  Examination:  Physical Exam: GEN: NAD, alert and oriented x 3, obese, chronically ill in appearance, appears older than stated age 109: NCAT, PERRL, EOMI, sclera clear, MMM PULM: Diminished breath sounds bilateral bases, no wheezing/crackles, normal respiratory effort without accessory muscle use, on room air CV: RRR w/o M/G/R GI: abd soft, NTND, NABS, no R/G/M MSK: 2-3+ pitting edema bilateral lower  extremities up to knee, moves all extremities independently NEURO: CN II-XII intact, no focal deficits, sensation to light touch intact PSYCH: Depressed mood, flat affect Integumentary: dry/intact, no rashes or wounds    Data Reviewed: I have personally reviewed following labs and imaging studies  CBC: Recent Labs  Lab 06/02/22 1613 06/03/22 0419  WBC 6.4 7.3  HGB 11.3* 11.3*  HCT 37.0 36.6  MCV 69.7* 68.9*  PLT 283 938   Basic Metabolic Panel: Recent Labs  Lab 06/02/22 1613 06/03/22 0419  NA 127* 131*  K 4.3 3.5  CL 92* 97*  CO2 24 26  GLUCOSE 543* 197*  BUN 11 11  CREATININE 0.92 0.80  CALCIUM 8.5* 8.5*   GFR: CrCl cannot be calculated (Unknown ideal weight.). Liver Function Tests: Recent Labs  Lab 06/02/22 1613 06/03/22 0419  AST 22 19  ALT 15 13  ALKPHOS 176* 168*  BILITOT 1.9* 1.6*  PROT 7.1 7.0  ALBUMIN 2.7* 2.5*   No results for input(s): "LIPASE", "AMYLASE" in the last 168 hours. No results for input(s): "AMMONIA" in the last 168 hours. Coagulation Profile: Recent Labs  Lab 06/02/22 1613 06/03/22 0419  INR 3.5* 4.3*   Cardiac Enzymes: No results for input(s): "CKTOTAL", "CKMB", "CKMBINDEX", "TROPONINI" in the last 168 hours. BNP (last 3 results) No results for input(s): "PROBNP" in the last 8760 hours. HbA1C: No results for input(s): "HGBA1C" in the last 72 hours. CBG: Recent Labs  Lab 06/02/22 1346  06/02/22 2244 06/03/22 0758 06/03/22 1120  GLUCAP 579* 378* 179* 119*   Lipid Profile: No results for input(s): "CHOL", "HDL", "LDLCALC", "TRIG", "CHOLHDL", "LDLDIRECT" in the last 72 hours. Thyroid Function Tests: Recent Labs    06/02/22 2136  TSH 1.321   Anemia Panel: No results for input(s): "VITAMINB12", "FOLATE", "FERRITIN", "TIBC", "IRON", "RETICCTPCT" in the last 72 hours. Sepsis Labs: No results for input(s): "PROCALCITON", "LATICACIDVEN" in the last 168 hours.  No results found for this or any previous visit (from the  past 240 hour(s)).       Radiology Studies: DG Chest 2 View  Result Date: 06/02/2022 CLINICAL DATA:  Chest pain and shortness of breath. EXAM: CHEST - 2 VIEW COMPARISON:  05/06/2022 CTA chest.  05/05/2022 chest radiograph. FINDINGS: Both views are degraded by patient body habitus and low lung volumes. Midline trachea. Cardiomegaly accentuated by AP portable technique. No right and no definite left pleural effusion. No pneumothorax. Low lung volumes with resultant pulmonary interstitial prominence. No lobar consolidation. IMPRESSION: Mild limitations as detailed above. Cardiomegaly and low lung volumes, without definite acute disease. Electronically Signed   By: Abigail Miyamoto M.D.   On: 06/02/2022 14:46        Scheduled Meds:  atorvastatin  40 mg Oral Q M,W,F   carvedilol  3.125 mg Oral BID WC   empagliflozin  10 mg Oral Daily   furosemide  60 mg Intravenous Q12H   insulin aspart  0-15 Units Subcutaneous TID WC   insulin glargine-yfgn  15 Units Subcutaneous Daily   losartan  25 mg Oral Daily   spironolactone  12.5 mg Oral Daily   Warfarin - Pharmacist Dosing Inpatient   Does not apply q1600   Continuous Infusions:   LOS: 1 day    Time spent: 52 minutes spent on chart review, discussion with nursing staff, consultants, updating family and interview/physical exam; more than 50% of that time was spent in counseling and/or coordination of care.    Shyheem Whitham J [place] DO [facility] Available via Epic secure chat 7am-7pm After these hours, please refer to coverage provider listed on [email or web address] 06/03/2022, 12:46 PM

## 2022-06-03 NOTE — Inpatient Diabetes Management (Signed)
Inpatient Diabetes Program Recommendations  AACE/ADA: New Consensus Statement on Inpatient Glycemic Control (2015)  Target Ranges:  Prepandial:   less than 140 mg/dL      Peak postprandial:   less than 180 mg/dL (1-2 hours)      Critically ill patients:  140 - 180 mg/dL   Lab Results  Component Value Date   GLUCAP 179 (H) 06/03/2022   HGBA1C 12.1 (H) 01/29/2022    Review of Glycemic Control  Diabetes history: DM2 Outpatient Diabetes medications: Jardiance 10 mg QD, Novolog 12 units QD at supper, Lantus 36 units QD Current orders for Inpatient glycemic control: Semglee 15 units QD, Novolog 0-15 units TID with meals  Updated HgbA1C pending  Inpatient Diabetes Program Recommendations:    May need meal coverage insulin if post-prandials > 180 mg/dL. If needed, would recommend Novolog 4 units TID  Will follow glucose trends and await HgbA1C results.  Thank you. Lorenda Peck, RD, LDN, [place] Inpatient Diabetes Coordinator [phone]

## 2022-06-04 ENCOUNTER — Inpatient Hospital Stay (HOSPITAL_COMMUNITY): Payer: Medicare Other

## 2022-06-04 DIAGNOSIS — I160 Hypertensive urgency: Secondary | ICD-10-CM | POA: Diagnosis not present

## 2022-06-04 LAB — BASIC METABOLIC PANEL
Anion gap: 10 (ref 5–15)
BUN: 13 mg/dL (ref 6–20)
CO2: 30 mmol/L (ref 22–32)
Calcium: 8.7 mg/dL — ABNORMAL LOW (ref 8.9–10.3)
Chloride: 96 mmol/L — ABNORMAL LOW (ref 98–111)
Creatinine, Ser: 0.92 mg/dL (ref 0.44–1.00)
GFR, Estimated: >60 mL/min (ref >60)
Glucose, Bld: 77 mg/dL (ref 70–99)
Potassium: 3.3 mmol/L — ABNORMAL LOW (ref 3.5–5.1)
Sodium: 136 mmol/L (ref 135–145)

## 2022-06-04 LAB — GLUCOSE, CAPILLARY
Glucose-Capillary: 193 mg/dL — ABNORMAL HIGH (ref 70–99)
Glucose-Capillary: 174 mg/dL — ABNORMAL HIGH (ref 70–99)
Glucose-Capillary: 329 mg/dL — ABNORMAL HIGH (ref 70–99)
Glucose-Capillary: 193 mg/dL — ABNORMAL HIGH (ref 70–99)

## 2022-06-04 LAB — PROTIME-INR
INR: 3.5 — ABNORMAL HIGH (ref 0.8–1.2)
Prothrombin Time: 35.1 seconds — ABNORMAL HIGH (ref 11.4–15.2)

## 2022-06-04 LAB — MAGNESIUM: Magnesium: 1.9 mg/dL (ref 1.7–2.4)

## 2022-06-04 LAB — URINE CULTURE

## 2022-06-04 MED ORDER — WARFARIN SODIUM 5 MG PO TABS
10.0000 mg | ORAL_TABLET | Freq: Once | ORAL | Status: AC
  Administered 2022-06-04: 10 mg via ORAL
  Filled 2022-06-04: qty 2

## 2022-06-04 MED ORDER — INSULIN GLARGINE-YFGN 100 UNIT/ML ~~LOC~~ SOLN
20.0000 [IU] | Freq: Every day | SUBCUTANEOUS | Status: DC
  Administered 2022-06-05 – 2022-06-06 (×2): 20 [IU] via SUBCUTANEOUS
  Filled 2022-06-04 (×2): qty 0.2

## 2022-06-04 MED ORDER — POTASSIUM CHLORIDE CRYS ER 20 MEQ PO TBCR
30.0000 meq | EXTENDED_RELEASE_TABLET | ORAL | Status: AC
  Administered 2022-06-04 (×2): 30 meq via ORAL
  Filled 2022-06-04 (×2): qty 1

## 2022-06-04 MED ORDER — TRAZODONE HCL 50 MG PO TABS
50.0000 mg | ORAL_TABLET | Freq: Once | ORAL | Status: AC
  Administered 2022-06-04: 50 mg via ORAL
  Filled 2022-06-04: qty 1

## 2022-06-04 MED ORDER — TRAMADOL HCL 50 MG PO TABS: 50.0000 mg | ORAL_TABLET | Freq: Four times a day (QID) | ORAL | Status: DC | PRN

## 2022-06-04 NOTE — Progress Notes (Signed)
Greasy for warfarin Indication: LV thrombus  Allergies  Allergen Reactions   Fish Allergy Swelling   Shellfish Allergy Swelling    Airway involvement including EMS - Has Epi-Pen   Metformin And Related Diarrhea   Trulicity [Dulaglutide] Nausea And Vomiting and Other (See Comments)    Multiple episodes of vomiting over multiple days   Nsaids Other (See Comments)    Per PCP interferes with daily meds (heart failure)   Advil [Ibuprofen] Other (See Comments)    Per PCP interferes with daily meds (heart failure)    Patient Measurements: Weight: 83.4 kg (183 lb 13.8 oz) Heparin Dosing Weight:   Vital Signs: Temp: 97.9 F (36.6 C) (11/11 0417) Temp Source: Oral (11/11 0417) BP: 133/99 (11/11 0417) Pulse Rate: 89 (11/11 0417)  Labs: Recent Labs    06/02/22 1613 06/02/22 2136 06/03/22 0419 06/04/22 0509  HGB 11.3*  --  11.3*  --   HCT 37.0  --  36.6  --   PLT 283  --  309  --   APTT 33  --   --   --   LABPROT 34.5*  --  40.7* 35.1*  INR 3.5*  --  4.3* 3.5*  CREATININE 0.92  --  0.80 0.92  TROPONINIHS 38* 31*  --   --      Estimated Creatinine Clearance: 73.1 mL/min (by C-G formula based on SCr of 0.92 mg/dL).  Medications:  Warfarin 15 mg everyday except for Monday, take 7.5 mg on Monday Last dose 11/8 at 2045   Assessment: 46 yo F on warfarin for LV thrombus. Pharmacy to dose while in patient. Admit INR: 3.5 - slightly above goal.  Hg 11.3, PLT WNL.   06/04/2022 INR 3.5 slightly above goal after dose held yesterday Hg 11.3 on  11/10 - stable, PLT WNL  No bleeding reported Goal of Therapy:  INR 2-3 Monitor platelets by anticoagulation protocol: Yes   Plan:  Warfarin 10 mg po x 1 dose today Daily INR  Eudelia Bunch, Pharm.D Use secure chat for questions 06/04/2022 7:02 AM

## 2022-06-04 NOTE — Progress Notes (Signed)
PT Cancellation Note  Patient Details Name: [name] MRN: [id number] DOB: [birth year]   Cancelled Treatment:    Reason Eval/Treat Not Completed: (P) Patient declined, no reason specified; pt nauseated with abdominal pain, requesting complete defer PT despite encouragement. We will follow up as schedule and pt status allows which may be another day.   Coolidge Breeze, PT, DPT [facility] Department Office: [phone] Weekend pager: [phone]  Coolidge Breeze 06/04/2022, 4:48 PM

## 2022-06-04 NOTE — Progress Notes (Signed)
Pt refusing CPAP QHS at this time.  

## 2022-06-04 NOTE — Progress Notes (Signed)
Patient educated on MD order for 1800 fluid restriction.  Patient verbalized understanding.  Angie Fava, RN

## 2022-06-04 NOTE — Progress Notes (Signed)
PROGRESS NOTE    [name]  JAS:[id number] DOB: 10-11-1975 DOA: 06/02/2022 PCP: Donney Dice, DO    Brief Narrative:   [name] is a 46 y.o. 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  Patient with multiple ED visits since 9/23 for similar complaints, last evaluation on 10/14 in which she complained of chest pain and was evaluated by cardiology and subsequently discharged home.  Patient reports that she has been unable to take her medications over the last 3 days due to the nausea and vomiting.  Also reports that she has been very fatigued.  Patient endorses headache but denies any chest discomfort at this time.  Further denies abdominal pain, no dysuria, no diarrhea, no cough or fever.  In the ED, Temperature 98.9 F, HR 97, RR 16, BP 176/136, SPO2 98% on room air.  Sodium 127, potassium 4.3, chloride 92, CO2 24, glucose 543, BUN 11, creatinine 0.92, AST 22, ALT 15, total bilirubin 1.9.  WBC 6.4, hemoglobin 11.3, platelets 283.  BNP 758.1.  High sensitive troponin 38>31.  Assessment & Plan:   Hypertensive urgency Hx Essential hypertension Blood pressure elevated 176/136 on admission, noncompliant with home medications. -- Carvedilol 3.125 mg p.o. twice daily -- Losartan 25 mg p.o. daily -- Spironolactone 12.5 mg p.o. daily -- Hydralazine 10 mg IV as needed -- Continue monitor BP closely  Acute on chronic combined systolic/diastolic congestive heart failure exacerbation Hx nonischemic cardiomyopathy Patient presenting with progressive shortness of breath, lower extremity edema.  BNP elevated on admission.  Chest x-ray with  cardiomegaly.  Patient has been noncompliant with her home medications.  Recent TTE 02/2022 with LVEF less than 20%, LV with severely decreased function, LV with global hypokinesis, LV cavity mildly dilated, grade 3 diastolic dysfunction, LA mildly dilated, mild MR, moderate TR, IVC normal in size. -- net negative 4.7L since admission -- Continue carvedilol, losartan, spironolactone, statin -- Furosemide 60 mg IV every 12 hours -- Fluid restriction 1800 mL per day -- Strict I's and O's and daily weights  Elevated troponin Troponin elevated 38 followed by 31 on admission.  EKG with normal sinus rhythm, no dynamic changes.  Patient denies chest pain.  Etiology likely secondary to type II demand ischemia in the setting of volume overload/CHF exacerbation. -- Continue monitor on telemetry  Hyponatremia: Resolved Patient presenting with a sodium of 127, likely secondary to hypervolemic hyponatremia in the setting of volume overload/CHF exacerbation. -- Na [id number] -- Continue IV diuresis as above -- Fluid restriction 1800 mL per day -- BMP daily  History of left ventricular thrombus on anticoagulation  TTE 02/2022 with LV thrombus present at apex 2.2 x 1.4 cm with some mobility. -- Continue Coumadin, pharmacy consulted for dosing/monitoring -- INR daily; 3.5 today  Paroxysmal atrial fibrillation Currently in normal sinus rhythm. -- Carvedilol 3.125 mg p.o. twice daily -- Coumadin, pharmacy consulted for dosing/monitoring -- INR daily  Type 2 diabetes mellitus, with hyperglycemia Hemoglobin A1c 12.1 on 01/29/2022, poorly controlled. -- repeat Hemoglobin A1C: Pending -- Semglee 20 units [place] daily -- Jardiance 10 mg p.o. daily -- Moderate SSI for coverage -- CBGs before every meal/at bedtime  Hyperlipidemia -- Atorvastatin 40 mg p.o. Monday/Wednesday/Friday  Obstructive sleep apnea Untreated outpatient.  Likely contributing factor to her poorly controlled blood pressure and heart  failure. -- CPAP nightly -- Will need outpatient sleep study on discharge  Weakness/debility/deconditioning: -- PT/OT consultation: Pending  Morbid obesity Discussed with patient needs for aggressive lifestyle changes/weight loss as this complicates all facets of care.  Outpatient follow-up with PCP.      DVT prophylaxis: Coumadin warfarin (COUMADIN) tablet 10 mg    Code Status: Full Code Family Communication: No family was at bedside this morning  Disposition Plan:  Level of care: Telemetry Status is: Inpatient Remains inpatient appropriate because: IV diuresis    Consultants:  None  Procedures:  None  Antimicrobials:  None   Subjective: Patient seen examined bedside, resting comfortably.  Lying in bed.  Continues to report generalized weakness.  RN reports that the reason she was not able to take her medicine was that she was not able to go up the stairs.  Continues with good urine output with IV diuresis.  No further reported nausea/vomiting.  No other specific questions or concerns at this time.  Denies headache, no dizziness, no chest pain, no palpitations, no shortness of breath, no abdominal pain, no focal weakness, no fever/chills/night sweats, no cough/congestion, no paresthesias.  No acute events overnight per nursing staff.  Objective: Vitals:   06/03/22 2054 06/04/22 0016 06/04/22 0417 06/04/22 0500  BP: (!) 128/92 113/86 (!) 133/99   Pulse: 78 88 89   Resp: '18 18 18   '$ Temp: 97.7 F (36.5 C) 98 F (36.7 C) 97.9 F (36.6 C)   TempSrc: Oral Oral Oral   SpO2: 98% 98% 99%   Weight:    83.4 kg    Intake/Output Summary (Last 24 hours) at 06/04/2022 1258 Last data filed at 06/04/2022 6283 Gross per 24 hour  Intake 220 ml  Output 3450 ml  Net -3230 ml   Filed Weights   06/04/22 0500  Weight: 83.4 kg    Examination:  Physical Exam: GEN: NAD, alert and oriented x 3, obese, chronically ill in appearance, appears older than stated age HEENT: NCAT,  PERRL, EOMI, sclera clear, MMM PULM: Diminished breath sounds bilateral bases, no wheezing/crackles, normal respiratory effort without accessory muscle use, on room air CV: RRR w/o M/G/R GI: abd soft, NTND, NABS, no R/G/M MSK: 2-3+ pitting edema bilateral lower extremities up to knee, moves all extremities independently NEURO: CN II-XII intact, no focal deficits, sensation to light touch intact PSYCH: Depressed mood, flat affect Integumentary: dry/intact, no rashes or wounds    Data Reviewed: I have personally reviewed following labs and imaging studies  CBC: Recent Labs  Lab 06/02/22 1613 06/03/22 0419  WBC 6.4 7.3  HGB 11.3* 11.3*  HCT 37.0 36.6  MCV 69.7* 68.9*  PLT 283 151   Basic Metabolic Panel: Recent Labs  Lab 06/02/22 1613 06/03/22 0419 06/04/22 0509  NA 127* 131* 136  K 4.3 3.5 3.3*  CL 92* 97* 96*  CO2 '24 26 30  '$ GLUCOSE 543* 197* 77  BUN '11 11 13  '$ CREATININE 0.92 0.80 0.92  CALCIUM 8.5* 8.5* 8.7*  MG  --   --  1.9   GFR: Estimated Creatinine Clearance: 73.1 mL/min (by C-G formula based on SCr of 0.92 mg/dL). Liver Function Tests: Recent Labs  Lab 06/02/22 1613 06/03/22 0419  AST 22 19  ALT 15 13  ALKPHOS 176* 168*  BILITOT 1.9* 1.6*  PROT 7.1 7.0  ALBUMIN 2.7* 2.5*   No results for input(s): "LIPASE", "AMYLASE" in the last 168 hours.  No results for input(s): "AMMONIA" in the last 168 hours. Coagulation Profile: Recent Labs  Lab 06/02/22 1613 06/03/22 0419 06/04/22 0509  INR 3.5* 4.3* 3.5*   Cardiac Enzymes: No results for input(s): "CKTOTAL", "CKMB", "CKMBINDEX", "TROPONINI" in the last 168 hours. BNP (last 3 results) No results for input(s): "PROBNP" in the last 8760 hours. HbA1C: No results for input(s): "HGBA1C" in the last 72 hours. CBG: Recent Labs  Lab 06/03/22 1120 06/03/22 1615 06/03/22 2054 06/04/22 0740 06/04/22 1139  GLUCAP 119* 103* 148* 193* 174*   Lipid Profile: No results for input(s): "CHOL", "HDL", "LDLCALC",  "TRIG", "CHOLHDL", "LDLDIRECT" in the last 72 hours. Thyroid Function Tests: Recent Labs    06/02/22 2136  TSH 1.321   Anemia Panel: No results for input(s): "VITAMINB12", "FOLATE", "FERRITIN", "TIBC", "IRON", "RETICCTPCT" in the last 72 hours. Sepsis Labs: No results for input(s): "PROCALCITON", "LATICACIDVEN" in the last 168 hours.  Recent Results (from the past 240 hour(s))  Urine Culture     Status: Abnormal   Collection Time: 06/02/22  9:22 PM   Specimen: Urine, Clean Catch  Result Value Ref Range Status   Specimen Description   Final    URINE, CLEAN CATCH Performed at [facility], [place] [address] [place], [place] [address]    Special Requests   Final    NONE Performed at [facility], [place] [address], [place], [place] [address]    Culture MULTIPLE SPECIES PRESENT, SUGGEST RECOLLECTION (A)  Final   Report Status 06/04/2022 FINAL  Final         Radiology Studies: DG Chest 2 View  Result Date: 06/02/2022 CLINICAL DATA:  Chest pain and shortness of breath. EXAM: CHEST - 2 VIEW COMPARISON:  05/06/2022 CTA chest.  05/05/2022 chest radiograph. FINDINGS: Both views are degraded by patient body habitus and low lung volumes. Midline trachea. Cardiomegaly accentuated by AP portable technique. No right and no definite left pleural effusion. No pneumothorax. Low lung volumes with resultant pulmonary interstitial prominence. No lobar consolidation. IMPRESSION: Mild limitations as detailed above. Cardiomegaly and low lung volumes, without definite acute disease. Electronically Signed   By: Abigail Miyamoto M.D.   On: 06/02/2022 14:46        Scheduled Meds:  atorvastatin  40 mg Oral Q M,W,F   carvedilol  3.125 mg Oral BID WC   empagliflozin  10 mg Oral Daily   furosemide  60 mg Intravenous Q12H   insulin aspart  0-15 Units Subcutaneous TID WC   insulin glargine-yfgn  15 Units Subcutaneous Daily   losartan  25 mg Oral Daily   spironolactone   12.5 mg Oral Daily   warfarin  10 mg Oral ONCE-1600   Warfarin - Pharmacist Dosing Inpatient   Does not apply q1600   Continuous Infusions:   LOS: 2 days    Time spent: 52 minutes spent on chart review, discussion with nursing staff, consultants, updating family and interview/physical exam; more than 50% of that time was spent in counseling and/or coordination of care.    Joy Haegele J [place] DO [facility] Available via Epic secure chat 7am-7pm After these hours, please refer to coverage provider listed on [email or web address] 06/04/2022, 12:58 PM

## 2022-06-05 DIAGNOSIS — I160 Hypertensive urgency: Secondary | ICD-10-CM | POA: Diagnosis not present

## 2022-06-05 DIAGNOSIS — I152 Hypertension secondary to endocrine disorders: Secondary | ICD-10-CM

## 2022-06-05 DIAGNOSIS — E1165 Type 2 diabetes mellitus with hyperglycemia: Secondary | ICD-10-CM

## 2022-06-05 DIAGNOSIS — E1169 Type 2 diabetes mellitus with other specified complication: Secondary | ICD-10-CM

## 2022-06-05 DIAGNOSIS — G4733 Obstructive sleep apnea (adult) (pediatric): Secondary | ICD-10-CM

## 2022-06-05 DIAGNOSIS — E1159 Type 2 diabetes mellitus with other circulatory complications: Secondary | ICD-10-CM

## 2022-06-05 DIAGNOSIS — E785 Hyperlipidemia, unspecified: Secondary | ICD-10-CM

## 2022-06-05 DIAGNOSIS — I428 Other cardiomyopathies: Secondary | ICD-10-CM

## 2022-06-05 DIAGNOSIS — I5041 Acute combined systolic (congestive) and diastolic (congestive) heart failure: Secondary | ICD-10-CM | POA: Diagnosis not present

## 2022-06-05 LAB — GLUCOSE, CAPILLARY
Glucose-Capillary: 214 mg/dL — ABNORMAL HIGH (ref 70–99)
Glucose-Capillary: 124 mg/dL — ABNORMAL HIGH (ref 70–99)
Glucose-Capillary: 124 mg/dL — ABNORMAL HIGH (ref 70–99)
Glucose-Capillary: 187 mg/dL — ABNORMAL HIGH (ref 70–99)

## 2022-06-05 LAB — PROTIME-INR
INR: 3.3 — ABNORMAL HIGH (ref 0.8–1.2)
Prothrombin Time: 33.1 seconds — ABNORMAL HIGH (ref 11.4–15.2)

## 2022-06-05 LAB — BASIC METABOLIC PANEL
Anion gap: 10 (ref 5–15)
BUN: 19 mg/dL (ref 6–20)
CO2: 30 mmol/L (ref 22–32)
Calcium: 8.4 mg/dL — ABNORMAL LOW (ref 8.9–10.3)
Chloride: 95 mmol/L — ABNORMAL LOW (ref 98–111)
Creatinine, Ser: 1.27 mg/dL — ABNORMAL HIGH (ref 0.44–1.00)
GFR, Estimated: 53 mL/min — ABNORMAL LOW (ref >60)
Glucose, Bld: 126 mg/dL — ABNORMAL HIGH (ref 70–99)
Potassium: 3.6 mmol/L (ref 3.5–5.1)
Sodium: 135 mmol/L (ref 135–145)

## 2022-06-05 LAB — MAGNESIUM: Magnesium: 2 mg/dL (ref 1.7–2.4)

## 2022-06-05 MED ORDER — FUROSEMIDE 40 MG PO TABS
40.0000 mg | ORAL_TABLET | Freq: Two times a day (BID) | ORAL | Status: DC
  Administered 2022-06-06: 40 mg via ORAL
  Filled 2022-06-05: qty 1

## 2022-06-05 MED ORDER — OXYCODONE HCL 5 MG PO TABS
5.0000 mg | ORAL_TABLET | ORAL | Status: DC | PRN
  Administered 2022-06-05 – 2022-06-06 (×4): 5 mg via ORAL
  Filled 2022-06-05 (×4): qty 1

## 2022-06-05 MED ORDER — WARFARIN SODIUM 2.5 MG PO TABS
2.5000 mg | ORAL_TABLET | Freq: Once | ORAL | Status: AC
  Administered 2022-06-05: 2.5 mg via ORAL
  Filled 2022-06-05: qty 1

## 2022-06-05 MED ORDER — SENNOSIDES-DOCUSATE SODIUM 8.6-50 MG PO TABS
2.0000 | ORAL_TABLET | Freq: Two times a day (BID) | ORAL | Status: DC
  Administered 2022-06-05 – 2022-06-06 (×2): 2 via ORAL
  Filled 2022-06-05 (×3): qty 2

## 2022-06-05 MED ORDER — POLYETHYLENE GLYCOL 3350 17 G PO PACK
17.0000 g | PACK | Freq: Every day | ORAL | Status: DC
  Filled 2022-06-05 (×2): qty 1

## 2022-06-05 MED ORDER — TRAZODONE HCL 50 MG PO TABS
50.0000 mg | ORAL_TABLET | Freq: Once | ORAL | Status: AC
  Administered 2022-06-05: 50 mg via ORAL
  Filled 2022-06-05: qty 1

## 2022-06-05 NOTE — Evaluation (Signed)
Occupational Therapy Evaluation Patient Details Name: [name] MRN: [id number] DOB: 1975/11/14 Today's Date: 06/05/2022   History of Present Illness Patient is a 46 year old female who presented with BLE edema, nausea and vomiting and shortness of breath. 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   Clinical Impression   Patient evaluated by Occupational Therapy with no further acute OT needs identified. All education has been completed and the patient has no further questions. Patient was MI in room for ADLs. Patient was educated on AE use to increase independence in LB dressing tasks.patient verbalized and demonstrated understanding.   See below for any follow-up Occupational Therapy or equipment needs. OT is signing off. Thank you for this referral.       Recommendations for follow up therapy are one component of a multi-disciplinary discharge planning process, led by the attending physician.  Recommendations may be updated based on patient status, additional functional criteria and insurance authorization.   Follow Up Recommendations  No OT follow up    Assistance Recommended at Discharge PRN  Patient can return home with the following Assistance with cooking/housework    Functional Status Assessment  Patient has not had a recent decline in their functional status  Equipment Recommendations  Other (comment) (total hip kit)    Recommendations for Other Services       Precautions / Restrictions Precautions Precautions: None Restrictions Weight Bearing Restrictions: No      Mobility Bed Mobility Overal bed mobility: Modified Independent                  Transfers Overall transfer level: Modified independent                        Balance Overall balance assessment: No apparent balance deficits (not formally assessed)                                          ADL either performed or assessed with clinical judgement   ADL Overall ADL's : Modified independent                                       General ADL Comments: patient was educated on how to complete LB dressing with AE with patient able to demonstrate understanding and complete don/doff socks and verbally demonstrate how to don/doff pants with reacher. patient was able to compelte toileting transfers and bed mobility with MI. patient was educated on total hip kit and benfits. patient reported she would look into it. patient reported her boyfriend helps at home with things she has difficulty with.     Vision Baseline Vision/History: 1 Wears glasses       Perception     Praxis      Pertinent Vitals/Pain Pain Assessment Pain Assessment: No/denies pain     Hand Dominance     Extremity/Trunk Assessment Upper Extremity Assessment Upper Extremity Assessment: Overall WFL for tasks assessed   Lower Extremity Assessment Lower Extremity Assessment: Defer to PT evaluation   Cervical / Trunk Assessment Cervical / Trunk Assessment: Normal   Communication Communication Communication: No difficulties   Cognition Arousal/Alertness: Awake/alert Behavior During Therapy: WFL for tasks assessed/performed Overall Cognitive Status: Within Functional Limits for  tasks assessed                                       General Comments       Exercises     Shoulder Instructions      Home Living Family/patient expects to be discharged to:: Private residence Living Arrangements: Alone Available Help at Discharge: Family;Available PRN/intermittently Type of Home: House Home Access: Stairs to enter Entrance Stairs-Number of Steps: 1   Home Layout: Two level;1/2 bath on main level Alternate Level Stairs-Number of Steps: flight Alternate Level Stairs-Rails: Right Bathroom Shower/Tub: Engineer, site: Standard Bathroom Accessibility: Yes   Home Equipment: None   Additional Comments: plans to d/c home with significant other      Prior Functioning/Environment Prior Level of Function : Independent/Modified Independent             Mobility Comments: no AD ADLs Comments: reports boyfriend assists with LB dressing        OT Problem List:        OT Treatment/Interventions:      OT Goals(Current goals can be found in the care plan section) Acute Rehab OT Goals OT Goal Formulation: All assessment and education complete, DC therapy  OT Frequency:      Co-evaluation              AM-PAC OT "6 Clicks" Daily Activity     Outcome Measure Help from another person eating meals?: None Help from another person taking care of personal grooming?: None Help from another person toileting, which includes using toliet, bedpan, or urinal?: None Help from another person bathing (including washing, rinsing, drying)?: None Help from another person to put on and taking off regular upper body clothing?: None Help from another person to put on and taking off regular lower body clothing?: None (with AE) 6 Click Score: 24   End of Session Equipment Utilized During Treatment: Gait belt;Other (comment) (total hip kit) Nurse Communication: Mobility status  Activity Tolerance: Patient tolerated treatment well Patient left: in bed;with call bell/phone within reach                   Time: 1224-4975 OT Time Calculation (min): 34 min Charges:  OT General Charges $OT Visit: 1 Visit OT Evaluation $OT Eval Low Complexity: 1 Low OT Treatments $Self Care/Home Management : 8-22 mins  Rennie Plowman, MS Acute Rehabilitation Department Office# [phone]   Marcellina Millin 06/05/2022, 4:28 PM

## 2022-06-05 NOTE — Plan of Care (Signed)
  Problem: Education: Goal: Knowledge of General Education information will improve Description Including pain rating scale, medication(s)/side effects and non-pharmacologic comfort measures Outcome: Progressing   

## 2022-06-05 NOTE — Evaluation (Signed)
Physical Therapy One Time Evaluation Patient Details Name: [name] MRN: [id number] DOB: [birth year] Today's Date: 06/05/2022  History of Present Illness  Pt is a 46yo female  presenting to [facility] ED on 11/9 with complaints persistent nausea and vomiting, BLE swelling, uncontrolled BP and hyperglycemia.   PMH: CHF, CAD s/p MI 8/23, afib, OSA, DM with DKA, HTN, depression, hx of L ventricular thrombus on Coumadin  Clinical Impression  Patient evaluated by Physical Therapy with no further acute PT needs identified. All education has been completed and the patient has no further questions.  Pt ambulated in hallway and agreeable to stay OOB in recliner end of session.  Pt encouraged to ambulate with staff during remainder of hospitalization. See below for any follow-up Physical Therapy or equipment needs. PT is signing off. Thank you for this referral.        Recommendations for follow up therapy are one component of a multi-disciplinary discharge planning process, led by the attending physician.  Recommendations may be updated based on patient status, additional functional criteria and insurance authorization.  Follow Up Recommendations No PT follow up      Assistance Recommended at Discharge PRN  Patient can return home with the following  Help with stairs or ramp for entrance    Equipment Recommendations None recommended by PT  Recommendations for Other Services       Functional Status Assessment Patient has not had a recent decline in their functional status     Precautions / Restrictions Precautions Precautions: None Restrictions Weight Bearing Restrictions: No      Mobility  Bed Mobility Overal bed mobility: Modified Independent                  Transfers Overall transfer level: Needs assistance Equipment used: None Transfers: Sit to/from Stand Sit to Stand: Supervision                Ambulation/Gait Ambulation/Gait assistance: Min guard,  Supervision Gait Distance (Feet): 200 Feet Assistive device: None Gait Pattern/deviations: Step-through pattern, Decreased stride length       General Gait Details: slow, reports feeling a little groggy from pain meds earlier  Stairs            Wheelchair Mobility    Modified Rankin (Stroke Patients Only)       Balance Overall balance assessment: No apparent balance deficits (not formally assessed)                                           Pertinent Vitals/Pain Pain Assessment Pain Assessment: No/denies pain    Home Living Family/patient expects to be discharged to:: Private residence Living Arrangements: Alone Available Help at Discharge: Family;Available PRN/intermittently Type of Home: House       Alternate Level Stairs-Number of Steps: flight Home Layout: Two level;1/2 bath on main level Home Equipment: None Additional Comments: plans to d/c home with significant other    Prior Function Prior Level of Function : Independent/Modified Independent             Mobility Comments: no AD       Hand Dominance        Extremity/Trunk Assessment        Lower Extremity Assessment Lower Extremity Assessment: Overall WFL for tasks assessed       Communication   Communication: No difficulties  Cognition Arousal/Alertness: Awake/alert Behavior During  Therapy: WFL for tasks assessed/performed Overall Cognitive Status: Within Functional Limits for tasks assessed                                          General Comments      Exercises     Assessment/Plan    PT Assessment Patient does not need any further PT services  PT Problem List         PT Treatment Interventions      PT Goals (Current goals can be found in the Care Plan section)  Acute Rehab PT Goals PT Goal Formulation: All assessment and education complete, DC therapy    Frequency       Co-evaluation               AM-PAC PT "6  Clicks" Mobility  Outcome Measure Help needed turning from your back to your side while in a flat bed without using bedrails?: None Help needed moving from lying on your back to sitting on the side of a flat bed without using bedrails?: None Help needed moving to and from a bed to a chair (including a wheelchair)?: A Little Help needed standing up from a chair using your arms (e.g., wheelchair or bedside chair)?: A Little Help needed to walk in hospital room?: A Little Help needed climbing 3-5 steps with a railing? : A Little 6 Click Score: 20    End of Session   Activity Tolerance: Patient tolerated treatment well Patient left: in chair;with call bell/phone within reach   PT Visit Diagnosis: Difficulty in walking, not elsewhere classified (R26.2)    Time: 1443-1540 PT Time Calculation (min) (ACUTE ONLY): 9 min   Charges:   PT Evaluation $PT Eval Low Complexity: 1 Low         Kati PT, DPT Physical Therapist Acute Rehabilitation Services Preferred contact method: Secure Chat Weekend Pager Only: [phone] Office: [phone]   Kati L Payson 06/05/2022, 1:32 PM

## 2022-06-05 NOTE — Progress Notes (Signed)
PROGRESS NOTE    [name]  EUM:[id number] DOB: 1976-01-18 DOA: 06/02/2022 PCP: Donney Dice, DO    Brief Narrative:   [name] is a 46 y.o. 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  Patient with multiple ED visits since 9/23 for similar complaints, last evaluation on 10/14 in which she complained of chest pain and was evaluated by cardiology and subsequently discharged home.  Patient reports that she has been unable to take her medications over the last 3 days due to the nausea and vomiting.  Also reports that she has been very fatigued.  Patient endorses headache but denies any chest discomfort at this time.  Further denies abdominal pain, no dysuria, no diarrhea, no cough or fever.  In the ED, Temperature 98.9 F, HR 97, RR 16, BP 176/136, SPO2 98% on room air.  Sodium 127, potassium 4.3, chloride 92, CO2 24, glucose 543, BUN 11, creatinine 0.92, AST 22, ALT 15, total bilirubin 1.9.  WBC 6.4, hemoglobin 11.3, platelets 283.  BNP 758.1.  High sensitive troponin 38>31.  Assessment & Plan:   Hypertensive urgency Hx Essential hypertension Blood pressure elevated 176/136 on admission, noncompliant with home medications. -- Carvedilol 3.125 mg p.o. twice daily -- Losartan 25 mg p.o. daily -- Spironolactone 12.5 mg p.o. daily -- Hydralazine 10 mg IV as needed -- Continue monitor BP closely  Acute on chronic combined systolic/diastolic congestive heart failure exacerbation Hx nonischemic cardiomyopathy Patient presenting with progressive shortness of breath, lower extremity edema.  BNP elevated on admission.  Chest x-ray with  cardiomegaly.  Patient has been noncompliant with her home medications.  Recent TTE 02/2022 with LVEF less than 20%, LV with severely decreased function, LV with global hypokinesis, LV cavity mildly dilated, grade 3 diastolic dysfunction, LA mildly dilated, mild MR, moderate TR, IVC normal in size. -- net negative 4.7L since admission -- Continue carvedilol, losartan, spironolactone, statin -- Discontinue IV furosemide today, transition to furosemide 40 mg p.o. twice daily tomorrow -- Fluid restriction 1800 mL per day -- Strict I's and O's and daily weights -- Repeat BMP in a.m.  Elevated troponin Troponin elevated 38 followed by 31 on admission.  EKG with normal sinus rhythm, no dynamic changes.  Patient denies chest pain.  Etiology likely secondary to type II demand ischemia in the setting of volume overload/CHF exacerbation. -- Continue monitor on telemetry  Hyponatremia: Resolved Patient presenting with a sodium of 127, likely secondary to hypervolemic hyponatremia in the setting of volume overload/CHF exacerbation. -- Na 127>131>136>135 -- Continue IV diuresis as above -- Fluid restriction 1800 mL per day -- BMP daily  History of left ventricular thrombus on anticoagulation  TTE 02/2022 with LV thrombus present at apex 2.2 x 1.4 cm with some mobility. -- Continue Coumadin, pharmacy consulted for dosing/monitoring -- INR daily; 3.3 today  Paroxysmal atrial fibrillation Currently in normal sinus rhythm. -- Carvedilol 3.125 mg p.o. twice daily -- Coumadin, pharmacy consulted for dosing/monitoring -- INR daily  Type 2 diabetes mellitus, with hyperglycemia Hemoglobin A1c 12.1 on 01/29/2022, poorly controlled. -- repeat Hemoglobin A1C: Pending -- Semglee 20 units [place] daily -- Jardiance 10 mg p.o. daily -- Moderate SSI for coverage -- CBGs before every meal/at bedtime  Hyperlipidemia -- Atorvastatin  40 mg p.o. Monday/Wednesday/Friday  Obstructive sleep apnea Untreated outpatient.   Likely contributing factor to her poorly controlled blood pressure and heart failure. -- CPAP nightly -- Will need outpatient sleep study on discharge  Chronic pain: Patient follows with family medicine teaching service outpatient.  Recently referred to pain management outpatient. -- Oxycodone as needed  Weakness/debility/deconditioning: -- PT/OT consultation: Pending  Constipation Abdominal x-ray with increased stool burden. -- Senokot-as 2 tablets p.o. twice daily -- MiraLAX daily  Morbid obesity Discussed with patient needs for aggressive lifestyle changes/weight loss as this complicates all facets of care.  Outpatient follow-up with PCP.      DVT prophylaxis: Coumadin warfarin (COUMADIN) tablet 2.5 mg    Code Status: Full Code Family Communication: No family was at bedside this morning  Disposition Plan:  Level of care: Telemetry Status is: Inpatient Remains inpatient appropriate because: IV diuresis    Consultants:  None  Procedures:  None  Antimicrobials:  None   Subjective: Patient seen examined bedside, resting comfortably.  Lying in bed.  Continues to complain of of generalized pain, Tylenol, ibuprofen and tramadol ineffective.  Also patient did not work with physical therapy yesterday, discussed with patient that she needs to have an evaluation today.  No other specific questions or concerns at this time.  We will hold further IV diuresis given bump in creatinine and plan to transition to oral furosemide tomorrow.   Denies headache, no dizziness, no chest pain, no palpitations, no shortness of breath, no abdominal pain, no focal weakness, no fever/chills/night sweats, no cough/congestion, no paresthesias.  No acute events overnight per nursing staff.  Objective: Vitals:   06/04/22 1700 06/04/22 2011 06/05/22 0630 06/05/22 1055  BP: 118/86 (!) 131/99 (!) 122/94 105/73  Pulse:    98  Resp:  (!) '21 19 18  '$ Temp:  98.2 F (36.8 C) 98.3 F (36.8 C)   TempSrc:   Oral Oral   SpO2:  100% 100% 99%  Weight:   80.1 kg     Intake/Output Summary (Last 24 hours) at 06/05/2022 1123 Last data filed at 06/05/2022 0800 Gross per 24 hour  Intake 1436 ml  Output 4400 ml  Net -2964 ml   Filed Weights   06/04/22 0500 06/05/22 0630  Weight: 83.4 kg 80.1 kg    Examination:  Physical Exam: GEN: NAD, alert and oriented x 3, obese, chronically ill in appearance, appears older than stated age HEENT: NCAT, PERRL, EOMI, sclera clear, MMM PULM: Diminished breath sounds bilateral bases, no wheezing/crackles, normal respiratory effort without accessory muscle use, on room air CV: RRR w/o M/G/R GI: abd soft, NTND, NABS, no R/G/M MSK: 2+ pitting edema bilateral lower extremities up to knee, moves all extremities independently NEURO: CN II-XII intact, no focal deficits, sensation to light touch intact PSYCH: Depressed mood, flat affect Integumentary: dry/intact, no rashes or wounds    Data Reviewed: I have personally reviewed following labs and imaging studies  CBC: Recent Labs  Lab 06/02/22 1613 06/03/22 0419  WBC 6.4 7.3  HGB 11.3* 11.3*  HCT 37.0 36.6  MCV 69.7* 68.9*  PLT 283 322   Basic Metabolic Panel: Recent Labs  Lab 06/02/22 1613 06/03/22 0419 06/04/22 0509 06/05/22 0426  NA 127* 131* 136 135  K 4.3 3.5 3.3* 3.6  CL 92* 97* 96* 95*  CO2 '24 26 30 30  '$ GLUCOSE 543* 197* 77 126*  BUN '11 11 13 19  '$ CREATININE 0.92 0.80 0.92 1.27*  CALCIUM 8.5* 8.5* 8.7* 8.4*  MG  --   --  1.9 2.0   GFR: Estimated Creatinine Clearance: 51.8 mL/min (A) (by C-G formula based on SCr of 1.27 mg/dL (H)). Liver Function Tests: Recent Labs  Lab 06/02/22 1613 06/03/22 0419  AST 22 19  ALT 15 13  ALKPHOS 176* 168*  BILITOT 1.9* 1.6*  PROT 7.1 7.0  ALBUMIN 2.7* 2.5*   No results for input(s): "LIPASE", "AMYLASE" in the last 168 hours. No results for input(s): "AMMONIA" in the last 168 hours. Coagulation Profile: Recent Labs  Lab 06/02/22 1613  06/03/22 0419 06/04/22 0509 06/05/22 0426  INR 3.5* 4.3* 3.5* 3.3*   Cardiac Enzymes: No results for input(s): "CKTOTAL", "CKMB", "CKMBINDEX", "TROPONINI" in the last 168 hours. BNP (last 3 results) No results for input(s): "PROBNP" in the last 8760 hours. HbA1C: No results for input(s): "HGBA1C" in the last 72 hours. CBG: Recent Labs  Lab 06/04/22 0740 06/04/22 1139 06/04/22 1713 06/04/22 2008 06/05/22 0748  GLUCAP 193* 174* 329* 193* 214*   Lipid Profile: No results for input(s): "CHOL", "HDL", "LDLCALC", "TRIG", "CHOLHDL", "LDLDIRECT" in the last 72 hours. Thyroid Function Tests: Recent Labs    06/02/22 2136  TSH 1.321   Anemia Panel: No results for input(s): "VITAMINB12", "FOLATE", "FERRITIN", "TIBC", "IRON", "RETICCTPCT" in the last 72 hours. Sepsis Labs: No results for input(s): "PROCALCITON", "LATICACIDVEN" in the last 168 hours.  Recent Results (from the past 240 hour(s))  Urine Culture     Status: Abnormal   Collection Time: 06/02/22  9:22 PM   Specimen: Urine, Clean Catch  Result Value Ref Range Status   Specimen Description   Final    URINE, CLEAN CATCH Performed at [facility], [place] [address] [place], [place] [address]    Special Requests   Final    NONE Performed at [facility], [place] [address] [place], [place] [address]    Culture MULTIPLE SPECIES PRESENT, SUGGEST RECOLLECTION (A)  Final   Report Status 06/04/2022 FINAL  Final         Radiology Studies: DG Abd 1 View  Result Date: 06/04/2022 CLINICAL DATA:  Abdominal pain with nausea and vomiting. EXAM: ABDOMEN - 1 VIEW COMPARISON:  CT, 05/06/2022. FINDINGS: Normal bowel gas pattern. Mild generalized increase in the colonic stool burden. No evidence of renal or ureteral stones. Several small phleboliths in the pelvis. Soft tissues otherwise unremarkable. Skeletal structures are unremarkable. IMPRESSION: 1. No acute findings.  No evidence of bowel  obstruction. 2. Mild increase in the colonic stool burden. Electronically Signed   By: Lajean Manes M.D.   On: 06/04/2022 18:52        Scheduled Meds:  atorvastatin  40 mg Oral Q M,W,F   carvedilol  3.125 mg Oral BID WC   empagliflozin  10 mg Oral Daily   [START ON 06/06/2022] furosemide  40 mg Oral BID   insulin aspart  0-15 Units Subcutaneous TID WC   insulin glargine-yfgn  20 Units Subcutaneous Daily   losartan  25 mg Oral Daily   polyethylene glycol  17 g Oral Daily   senna-docusate  2 tablet Oral BID   spironolactone  12.5 mg Oral Daily   warfarin  2.5 mg Oral ONCE-1600   Warfarin - Pharmacist Dosing Inpatient   Does not apply q1600   Continuous Infusions:   LOS: 3 days    Time spent: 48 minutes spent on chart review, discussion with nursing staff, consultants, updating family and interview/physical exam; more than 50% of that time was spent in counseling and/or coordination  of care.    Konstantinos Cordoba J [place] DO [facility] Available via Epic secure chat 7am-7pm After these hours, please refer to coverage provider listed on [email or web address] 06/05/2022, 11:23 AM

## 2022-06-05 NOTE — Progress Notes (Signed)
[place] for warfarin Indication: LV thrombus  Allergies  Allergen Reactions   Fish Allergy Swelling   Shellfish Allergy Swelling    Airway involvement including EMS - Has Epi-Pen   Metformin And Related Diarrhea   Trulicity [Dulaglutide] Nausea And Vomiting and Other (See Comments)    Multiple episodes of vomiting over multiple days   Nsaids Other (See Comments)    Per PCP interferes with daily meds (heart failure)   Advil [Ibuprofen] Other (See Comments)    Per PCP interferes with daily meds (heart failure)    Patient Measurements: Weight: 80.1 kg (176 lb 9.4 oz) Heparin Dosing Weight:   Vital Signs: Temp: 98.3 F (36.8 C) (11/12 0630) Temp Source: Oral (11/12 0630) BP: 122/94 (11/12 0630)  Labs: Recent Labs    06/02/22 1613 06/02/22 2136 06/03/22 0419 06/04/22 0509 06/05/22 0426  HGB 11.3*  --  11.3*  --   --   HCT 37.0  --  36.6  --   --   PLT 283  --  309  --   --   APTT 33  --   --   --   --   LABPROT 34.5*  --  40.7* 35.1* 33.1*  INR 3.5*  --  4.3* 3.5* 3.3*  CREATININE 0.92  --  0.80 0.92 1.27*  TROPONINIHS 38* 31*  --   --   --      Estimated Creatinine Clearance: 51.8 mL/min (A) (by C-G formula based on SCr of 1.27 mg/dL (H)).  Medications:  Warfarin 15 mg everyday except for Monday, take 7.5 mg on Monday Last dose 11/8 at 2045   Assessment: 46 yo F on warfarin for LV thrombus. Pharmacy to dose while in patient. Admit INR: 3.5 - slightly above goal.  Hg 11.3, PLT WNL.   06/05/2022 INR 3.3 - supratherapeutic CBC 11/10: Hg 11.3, PLT WNL No drug drug interactions noted, minimal ibuprofen use No bleeding reported  Goal of Therapy:  INR 2-3 Monitor platelets by anticoagulation protocol: Yes   Plan:  Warfarin 2.'5mg'$  PO x 1 today Daily INR Check CBC in AM  Peggyann Juba, PharmD, BCPS Pharmacy: [phone] 06/05/2022 9:15 AM

## 2022-06-05 NOTE — Progress Notes (Signed)
Patient refuses CPAP 

## 2022-06-06 ENCOUNTER — Other Ambulatory Visit (HOSPITAL_COMMUNITY): Payer: Self-pay

## 2022-06-06 DIAGNOSIS — E1169 Type 2 diabetes mellitus with other specified complication: Secondary | ICD-10-CM | POA: Diagnosis not present

## 2022-06-06 DIAGNOSIS — I160 Hypertensive urgency: Secondary | ICD-10-CM | POA: Diagnosis not present

## 2022-06-06 DIAGNOSIS — I5041 Acute combined systolic (congestive) and diastolic (congestive) heart failure: Secondary | ICD-10-CM | POA: Diagnosis not present

## 2022-06-06 DIAGNOSIS — E1159 Type 2 diabetes mellitus with other circulatory complications: Secondary | ICD-10-CM | POA: Diagnosis not present

## 2022-06-06 LAB — BASIC METABOLIC PANEL
Anion gap: 9 (ref 5–15)
BUN: 15 mg/dL (ref 6–20)
CO2: 28 mmol/L (ref 22–32)
Calcium: 8.5 mg/dL — ABNORMAL LOW (ref 8.9–10.3)
Chloride: 98 mmol/L (ref 98–111)
Creatinine, Ser: 1.05 mg/dL — ABNORMAL HIGH (ref 0.44–1.00)
GFR, Estimated: >60 mL/min (ref >60)
Glucose, Bld: 130 mg/dL — ABNORMAL HIGH (ref 70–99)
Potassium: 4.1 mmol/L (ref 3.5–5.1)
Sodium: 135 mmol/L (ref 135–145)

## 2022-06-06 LAB — CBC
HCT: 37.8 % (ref 36.0–46.0)
Hemoglobin: 11.5 g/dL — ABNORMAL LOW (ref 12.0–15.0)
MCH: 21.2 pg — ABNORMAL LOW (ref 26.0–34.0)
MCHC: 30.4 g/dL (ref 30.0–36.0)
MCV: 69.6 fL — ABNORMAL LOW (ref 80.0–100.0)
Platelets: 316 10*3/uL (ref 150–400)
RBC: 5.43 MIL/uL — ABNORMAL HIGH (ref 3.87–5.11)
RDW: 20.5 % — ABNORMAL HIGH (ref 11.5–15.5)
WBC: 7.6 10*3/uL (ref 4.0–10.5)
nRBC: 0 % (ref 0.0–0.2)

## 2022-06-06 LAB — MAGNESIUM: Magnesium: 2 mg/dL (ref 1.7–2.4)

## 2022-06-06 LAB — PROTIME-INR
INR: 2.8 — ABNORMAL HIGH (ref 0.8–1.2)
Prothrombin Time: 29.4 seconds — ABNORMAL HIGH (ref 11.4–15.2)

## 2022-06-06 LAB — GLUCOSE, CAPILLARY: Glucose-Capillary: 188 mg/dL — ABNORMAL HIGH (ref 70–99)

## 2022-06-06 MED ORDER — ONETOUCH VERIO VI STRP
ORAL_STRIP | Freq: Four times a day (QID) | 0 refills | Status: DC
  Filled 2022-06-06: qty 100, 25d supply, fill #0

## 2022-06-06 MED ORDER — BLOOD GLUCOSE METER KIT
PACK | Freq: Four times a day (QID) | 0 refills | Status: DC
  Filled 2022-06-06: qty 1, fill #0

## 2022-06-06 MED ORDER — ONDANSETRON 4 MG PO TBDP
4.0000 mg | ORAL_TABLET | Freq: Three times a day (TID) | ORAL | 0 refills | Status: DC | PRN
  Filled 2022-06-06: qty 20, 7d supply, fill #0

## 2022-06-06 MED ORDER — POLYETHYLENE GLYCOL 3350 17 G PO PACK
17.0000 g | PACK | Freq: Every day | ORAL | 0 refills | Status: DC | PRN
  Filled 2022-06-06: qty 14, 14d supply, fill #0

## 2022-06-06 MED ORDER — SENNOSIDES-DOCUSATE SODIUM 8.6-50 MG PO TABS
1.0000 | ORAL_TABLET | Freq: Two times a day (BID) | ORAL | 2 refills | Status: DC
  Filled 2022-06-06: qty 60, 30d supply, fill #0

## 2022-06-06 MED ORDER — ACCU-CHEK SOFTCLIX LANCETS MISC
Freq: Four times a day (QID) | 0 refills | Status: DC
  Filled 2022-06-06: qty 100, 25d supply, fill #0

## 2022-06-06 MED ORDER — OXYCODONE HCL 5 MG PO TABS
5.0000 mg | ORAL_TABLET | Freq: Four times a day (QID) | ORAL | 0 refills | Status: DC | PRN
  Filled 2022-06-06: qty 20, 5d supply, fill #0

## 2022-06-06 NOTE — Discharge Summary (Signed)
Physician Discharge Summary  [name] YBW:[id number] DOB: November 21, 1975 DOA: 06/02/2022  PCP: Donney Dice, DO  Admit date: 06/02/2022 Discharge date: 06/06/2022  Admitted From: Home Disposition: Home  Recommendations for Outpatient Follow-up:  Follow up with PCP in 1-2 weeks Continue to encourage adherence to her home medications, recommend daily weight monitoring given her severe systolic congestive heart failure Recommend outpatient referral for sleep study for suspected OSA  Home Health: No needs identified by PT/OT Equipment/Devices: Glucometer  Discharge Condition: Stable CODE STATUS: Full code Diet recommendation: Heart healthy/consistent carb regular diet with fluid restriction 1800 mL per day  History of present illness:  [name] is a 46 y.o. 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  Patient with multiple ED visits since 9/23 for similar complaints, last evaluation on 10/14 in which she complained of chest pain and was evaluated by cardiology and subsequently discharged home.  Patient reports that she has been unable to take her medications over the last 3 days due to the nausea and vomiting.  Also reports that she has been very fatigued.  Patient endorses headache but denies any chest discomfort at this time.  Further denies abdominal pain, no dysuria, no diarrhea, no cough or fever.   In the ED, Temperature 98.9 F, HR 97, RR 16, BP 176/136, SPO2 98% on room air.  Sodium 127, potassium 4.3, chloride 92, CO2 24, glucose 543, BUN 11, creatinine 0.92, AST 22, ALT 15, total bilirubin 1.9.  WBC 6.4, hemoglobin 11.3,  platelets 283.  BNP 758.1.  High sensitive troponin 38>31.  TRH consulted for admission for further evaluation and management of hypertensive urgency, CHF exacerbation.  Hospital course:  Hypertensive urgency Hx Essential hypertension Blood pressure elevated 176/136 on admission, noncompliant with home medications.  Patient was restarted on her home Carvedilol 3.125 mg p.o. twice daily, Losartan 25 mg p.o. daily, Spironolactone 12.5 mg p.o. daily and furosemide with adequate control of her blood pressure.  Outpatient follow-up with PCP.  Continue to encourage medication adherence.   Acute on chronic combined systolic/diastolic congestive heart failure exacerbation Hx nonischemic cardiomyopathy Patient presenting with progressive shortness of breath, lower extremity edema.  BNP elevated on admission.  Chest x-ray with cardiomegaly.  Patient has been noncompliant with her home medications.  Recent TTE 02/2022 with LVEF less than 20%, LV with severely decreased function, LV with global hypokinesis, LV cavity mildly dilated, grade 3 diastolic dysfunction, LA mildly dilated, mild MR, moderate TR, IVC normal in size.  Patient was started on IV furosemide with good urine output and was net negative 11 liters during the hospitalization.  Discharge weight 81.8 kg.  Continue carvedilol, losartan, spironolactone, furosemide 80 mg p.o. daily and statin.  Recommend fluid restriction 1800 mL/day.  Outpatient follow-up with cardiology.   Elevated troponin Troponin elevated 38 followed by 31 on admission.  EKG with normal sinus rhythm, no dynamic changes.  Patient denies chest pain.  Etiology likely secondary to type II demand ischemia in the setting of volume overload/CHF exacerbation. -- Continue monitor on telemetry   Hyponatremia: Resolved Patient presenting with a sodium of 127, likely secondary to hypervolemic hyponatremia in the setting of volume overload/CHF exacerbation.  Patient was started on IV diuresis  as above with  fluid restriction and patient's sodium improved to 135 at time of discharge.   History of left ventricular thrombus on anticoagulation  TTE 02/2022 with LV thrombus present at apex 2.2 x 1.4 cm with some mobility.  Continue Coumadin.  Outpatient follow-up with PCP/cardiology.   Paroxysmal atrial fibrillation Currently in normal sinus rhythm. Carvedilol 3.125 mg p.o. twice daily, continue anticoagulation with Coumadin.   Type 2 diabetes mellitus, with hyperglycemia Hemoglobin A1c 12.1 on 01/29/2022, poorly controlled.  Continue Lantus, Jardiance, sliding scale insulin.  Outpatient follow-up with PCP.  Hyperlipidemia Continue atorvastatin 40 mg p.o. Monday/Wednesday/Friday   Obstructive sleep apnea Untreated outpatient.  Likely contributing factor to her poorly controlled blood pressure and heart failure.  Recommend follow-up with PCP for referral for outpatient sleep study.   Chronic pain: Patient follows with family medicine teaching service outpatient.  Recently referred to pain management outpatient.   Weakness/debility/deconditioning: Evaluated by PT and OT during hospitalization with no needs identified.   Constipation Abdominal x-ray with increased stool burden.  Continue Senokot 1 tab twice daily and MiraLAX as needed on discharge.   Morbid obesity Discussed with patient needs for aggressive lifestyle changes/weight loss as this complicates all facets of care.  Outpatient follow-up with PCP.    Discharge Diagnoses:  Active Problems:   Poorly controlled type 2 diabetes mellitus (HCC)   Hypertension associated with diabetes ([place])   Obstructive sleep apnea   Hyperlipidemia associated with type 2 diabetes mellitus ([place])   Morbid obesity (HCC)   Acute combined systolic and diastolic heart failure (HCC)   Nonischemic cardiomyopathy (HCC)   Hypertensive urgency    Discharge Instructions  Discharge Instructions     Call MD for:  difficulty breathing, headache or  visual disturbances   Complete by: As directed    Call MD for:  extreme fatigue   Complete by: As directed    Call MD for:  persistant dizziness or light-headedness   Complete by: As directed    Call MD for:  persistant nausea and vomiting   Complete by: As directed    Call MD for:  severe uncontrolled pain   Complete by: As directed    Call MD for:  temperature >100.4   Complete by: As directed    Diet - low sodium heart healthy   Complete by: As directed    Increase activity slowly   Complete by: As directed       Allergies as of 06/06/2022       Reactions   Fish Allergy Swelling   Shellfish Allergy Swelling   Airway involvement including EMS - Has Epi-Pen   Metformin And Related Diarrhea   Trulicity [dulaglutide] Nausea And Vomiting, Other (See Comments)   Multiple episodes of vomiting over multiple days   Nsaids Other (See Comments)   Per PCP interferes with daily meds (heart failure)   Advil [ibuprofen] Other (See Comments)   Per PCP interferes with daily meds (heart failure)        Medication List     STOP taking these medications    pantoprazole 40 MG tablet Commonly known as: PROTONIX       TAKE these medications    Accu-Chek Softclix Lancets lancets Use as instructed   acetaminophen 325 MG tablet Commonly known as: TYLENOL Take 2 tablets (650 mg total) by mouth every 6 (six) hours as needed for mild pain (or Fever >/= 101).   atorvastatin 40 MG tablet Commonly known as: LIPITOR TAKE 1 TABLET BY MOUTH DAILY AT 6  PM. What changed:  how much to take when to take this   blood glucose meter kit and supplies Dispense based on patient and insurance preference. Use up to four times daily as directed. (FOR ICD-10 E10.9, E11.9).   carvedilol 3.125 MG tablet Commonly known as: COREG Take 1 tablet (3.125 mg total) by mouth 2 (two) times daily with a meal.   empagliflozin 10 MG Tabs tablet Commonly known as: JARDIANCE Take 1 tablet (10 mg total) by  mouth daily.   furosemide 80 MG tablet Commonly known as: Lasix Take 1 tablet (80 mg total) by mouth every morning.   hydrOXYzine 10 MG tablet Commonly known as: ATARAX Take 1 tablet (10 mg total) by mouth 3 (three) times daily as needed. What changed: reasons to take this   Lantus SoloStar 100 UNIT/ML Solostar Pen Generic drug: insulin glargine Inject 36 Units into the skin every morning. What changed: how much to take   losartan 25 MG tablet Commonly known as: COZAAR Take 1 tablet (25 mg total) by mouth daily.   NovoLOG FlexPen 100 UNIT/ML FlexPen Generic drug: insulin aspart Inject 12 Units into the skin daily before supper. What changed: how much to take   ondansetron 4 MG disintegrating tablet Commonly known as: ZOFRAN-ODT Dissolve 1 tablet (4 mg total) by mouth every 8 (eight) hours as needed for nausea or vomiting.   oxyCODONE 5 MG immediate release tablet Commonly known as: Oxy IR/ROXICODONE Take 1 tablet (5 mg total) by mouth every 6 (six) hours as needed for moderate pain.   polyethylene glycol 17 g packet Commonly known as: MIRALAX / GLYCOLAX Take 17 g by mouth daily as needed for mild constipation.   senna-docusate 8.6-50 MG tablet Commonly known as: Senokot-S Take 1 tablet by mouth 2 (two) times daily.   spironolactone 25 MG tablet Commonly known as: ALDACTONE Take 0.5 tablets (12.5 mg total) by mouth daily.   traZODone 50 MG tablet Commonly known as: DESYREL Take 1 tablet (50 mg total) by mouth at bedtime.   Unifine Pentips 31G X 5 MM Misc Generic drug: Insulin Pen Needle USE AS DIRECTED 2 TIMES DAILY   warfarin 7.5 MG tablet Commonly known as: COUMADIN Take as directed. If you are unsure how to take this medication, talk to your nurse or doctor. Original instructions: Take 1-2 tablets (7.5-15 mg total) by mouth daily as directed by [facility] What changed:  when to take this additional instructions        Follow-up Information      Ganta, Anupa, DO. Schedule an appointment as soon as possible for a visit in 1 week(s).   Specialty: Family Medicine Contact information: [place] [address] [phone]         Janina Mayo, MD. Schedule an appointment as soon as possible for a visit.   Specialty: Cardiology Contact information: [address] [place] [address] [phone]                Allergies  Allergen Reactions   Fish Allergy Swelling   Shellfish Allergy Swelling    Airway involvement including EMS - Has Epi-Pen   Metformin And Related Diarrhea   Trulicity [Dulaglutide] Nausea And Vomiting and Other (See Comments)    Multiple episodes of vomiting over multiple days   Nsaids Other (See Comments)    Per PCP interferes with daily meds (heart failure)   Advil [Ibuprofen] Other (See Comments)    Per PCP interferes with daily  meds (heart failure)    Consultations: None   Procedures/Studies: DG Abd 1 View  Result Date: 06/04/2022 CLINICAL DATA:  Abdominal pain with nausea and vomiting. EXAM: ABDOMEN - 1 VIEW COMPARISON:  CT, 05/06/2022. FINDINGS: Normal bowel gas pattern. Mild generalized increase in the colonic stool burden. No evidence of renal or ureteral stones. Several small phleboliths in the pelvis. Soft tissues otherwise unremarkable. Skeletal structures are unremarkable. IMPRESSION: 1. No acute findings.  No evidence of bowel obstruction. 2. Mild increase in the colonic stool burden. Electronically Signed   By: Lajean Manes M.D.   On: 06/04/2022 18:52   DG Chest 2 View  Result Date: 06/02/2022 CLINICAL DATA:  Chest pain and shortness of breath. EXAM: CHEST - 2 VIEW COMPARISON:  05/06/2022 CTA chest.  05/05/2022 chest radiograph. FINDINGS: Both views are degraded by patient body habitus and low lung volumes. Midline trachea. Cardiomegaly accentuated by AP portable technique. No right and no definite left pleural effusion. No pneumothorax. Low lung  volumes with resultant pulmonary interstitial prominence. No lobar consolidation. IMPRESSION: Mild limitations as detailed above. Cardiomegaly and low lung volumes, without definite acute disease. Electronically Signed   By: Abigail Miyamoto M.D.   On: 06/02/2022 14:46     Subjective: Patient seen examined bedside, resting comfortably.  Mobilizing around the room.  Lower extremity edema much improved following IV Lasix.  Patient requesting glucometer and Zofran on discharge.  Discussed with patient needs close follow-up with her PCP and cardiology on discharge.  No other questions or concerns at this time.  Denies headache, no dizziness, no chest pain, no palpitations, no shortness of breath, no abdominal pain, no focal weakness, no fever/chills/night sweats, no nausea/vomiting/diarrhea, no fatigue, no paresthesias.  No acute events overnight per nursing staff.  Discharge Exam: Vitals:   06/05/22 2138 06/06/22 0618  BP: 118/85 128/88  Pulse: 95 (!) 105  Resp: 20 20  Temp: 98.2 F (36.8 C) 98.4 F (36.9 C)  SpO2: 99% 100%   Vitals:   06/05/22 1958 06/05/22 2138 06/06/22 0500 06/06/22 0618  BP:  118/85  128/88  Pulse:  95  (!) 105  Resp: _0 Temp:  98.2 F (36.8 C)  98.4 F (36.9 C)  TempSrc:  Oral  Oral  SpO2:  99%  100%  Weight:   81.8 kg     Physical Exam: GEN: NAD, alert and oriented x 3, obese, chronically ill in appearance, appears older than stated age HEENT: NCAT, PERRL, EOMI, sclera clear, MMM PULM: CTAB w/o wheezes/crackles, normal respiratory effort, on room air CV: RRR w/o M/G/R GI: abd soft, NTND, NABS, no R/G/M MSK: 1-2+ peripheral edema, muscle strength globally intact 5/5 bilateral upper/lower extremities NEURO: CN II-XII intact, no focal deficits, sensation to light touch intact PSYCH: Depressed mood, flat affect Integumentary: dry/intact, no rashes or wounds    The results of significant diagnostics from this hospitalization (including imaging,  microbiology, ancillary and laboratory) are listed below for reference.     Microbiology: Recent Results (from the past 240 hour(s))  Urine Culture     Status: Abnormal   Collection Time: 06/02/22  9:22 PM   Specimen: Urine, Clean Catch  Result Value Ref Range Status   Specimen Description   Final    URINE, CLEAN CATCH Performed at [facility], [place] [address] [place], [place] [address]    Special Requests   Final    NONE Performed at [facility], [address] [place],  [place] [address]    Culture MULTIPLE SPECIES PRESENT, SUGGEST RECOLLECTION (A)  Final   Report Status 06/04/2022 FINAL  Final     Labs: BNP (last 3 results) Recent Labs    04/19/22 0354 05/05/22 2148 06/02/22 1613  BNP 633.4* 782.2* 196.2*   Basic Metabolic Panel: Recent Labs  Lab 06/02/22 1613 06/03/22 0419 06/04/22 0509 06/05/22 0426 06/06/22 0412  NA 127* 131* 136 135 135  K 4.3 3.5 3.3* 3.6 4.1  CL 92* 97* 96* 95* 98  CO2 _0 GLUCOSE 543* 197* 77 126* 130*  BUN _1 CREATININE 0.92 0.80 0.92 1.27* 1.05*  CALCIUM 8.5* 8.5* 8.7* 8.4* 8.5*  MG  --   --  1.9 2.0 2.0   Liver Function Tests: Recent Labs  Lab 06/02/22 1613 06/03/22 0419  AST 22 19  ALT 15 13  ALKPHOS 176* 168*  BILITOT 1.9* 1.6*  PROT 7.1 7.0  ALBUMIN 2.7* 2.5*   No results for input(s): "LIPASE", "AMYLASE" in the last 168 hours. No results for input(s): "AMMONIA" in the last 168 hours. CBC: Recent Labs  Lab 06/02/22 1613 06/03/22 0419 06/06/22 0412  WBC 6.4 7.3 7.6  HGB 11.3* 11.3* 11.5*  HCT 37.0 36.6 37.8  MCV 69.7* 68.9* 69.6*  PLT 283 309 316   Cardiac Enzymes: No results for input(s): "CKTOTAL", "CKMB", "CKMBINDEX", "TROPONINI" in the last 168 hours. BNP: Invalid input(s): "POCBNP" CBG: Recent Labs  Lab 06/05/22 0748 06/05/22 1326 06/05/22 1817 06/05/22 2139 06/06/22 0816  GLUCAP 214* 124* 124* 187* 188*   D-Dimer No results for  input(s): "DDIMER" in the last 72 hours. Hgb A1c No results for input(s): "HGBA1C" in the last 72 hours. Lipid Profile No results for input(s): "CHOL", "HDL", "LDLCALC", "TRIG", "CHOLHDL", "LDLDIRECT" in the last 72 hours. Thyroid function studies No results for input(s): "TSH", "T4TOTAL", "T3FREE", "THYROIDAB" in the last 72 hours.  Invalid input(s): "FREET3" Anemia work up No results for input(s): "VITAMINB12", "FOLATE", "FERRITIN", "TIBC", "IRON", "RETICCTPCT" in the last 72 hours. Urinalysis    Component Value Date/Time   COLORURINE YELLOW 06/02/2022 1409   APPEARANCEUR CLEAR 06/02/2022 1409   LABSPEC 1.029 06/02/2022 1409   PHURINE 6.0 06/02/2022 1409   GLUCOSEU >=500 (A) 06/02/2022 1409   HGBUR NEGATIVE 06/02/2022 1409   BILIRUBINUR NEGATIVE 06/02/2022 1409   BILIRUBINUR NEG 04/05/2013 1057   KETONESUR NEGATIVE 06/02/2022 1409   PROTEINUR 30 (A) 06/02/2022 1409   UROBILINOGEN 0.2 08/09/2013 2031   NITRITE NEGATIVE 06/02/2022 1409   LEUKOCYTESUR TRACE (A) 06/02/2022 1409   Sepsis Labs Recent Labs  Lab 06/02/22 1613 06/03/22 0419 06/06/22 0412  WBC 6.4 7.3 7.6   Microbiology Recent Results (from the past 240 hour(s))  Urine Culture     Status: Abnormal   Collection Time: 06/02/22  9:22 PM   Specimen: Urine, Clean Catch  Result Value Ref Range Status   Specimen Description   Final    URINE, CLEAN CATCH Performed at [facility], [place] [address] [place], [place] [address]    Special Requests   Final    NONE Performed at [facility], [place] [address] [place], [place] [address]    Culture MULTIPLE SPECIES PRESENT, SUGGEST RECOLLECTION (A)  Final   Report Status 06/04/2022 FINAL  Final     Time coordinating discharge: Over 30 minutes  SIGNED:   Tajana Crotteau J [place] DO  [facility] 06/06/2022, 9:56 AM

## 2022-06-06 NOTE — Progress Notes (Signed)
RN provided patient with Living Better with Heart Failure booklet, teach back education on managing HF.  Patient verbalized understanding.  Angie Fava, RN

## 2022-06-06 NOTE — Progress Notes (Signed)
Patient informed me this morning that her PTA warfarin regimen is 11.25 mg  (takes 1.5 tab of the 7.5 mg tablet) daily except 7.5 mg (1 tab) on Monday.  I spoke to Dr. [place] and he was in agreement that pt should continue this regimen at discharge.  Patient's AVS was printed and she was discharged home before the AVS was corrected to the warfarin regimen above.  I called and spoke to pt at ~11:05am on 06/06/22 to inform her that her warfarin regimen should be 11.25 mg daily except 7.5 mg on Monday.  She verbalized understanding.  Dia Sitter, PharmD, BCPS 06/06/2022 11:08 AM

## 2022-06-06 NOTE — Progress Notes (Signed)
Discharge instructions provided to patient, patient verbalized understanding.  PIV and cardiac monitoring removed.  Patient escorted to main entrance with belongings via wheelchair for transport home with significant other.  Angie Fava, RN

## 2022-06-07 ENCOUNTER — Other Ambulatory Visit (HOSPITAL_COMMUNITY): Payer: Medicaid Other

## 2022-06-07 LAB — HEMOGLOBIN A1C
Hgb A1c MFr Bld: >15.5 % — ABNORMAL HIGH (ref 4.8–5.6)
Mean Plasma Glucose: >398 mg/dL

## 2022-06-24 ENCOUNTER — Encounter (HOSPITAL_COMMUNITY): Payer: Self-pay

## 2022-06-24 ENCOUNTER — Inpatient Hospital Stay (HOSPITAL_COMMUNITY)
Admission: EM | Admit: 2022-06-24 | Discharge: 2022-07-01 | DRG: 391 | Disposition: A | Discharge status: Home / Self Care | Payer: Medicare Other | Attending: Internal Medicine | Admitting: Internal Medicine

## 2022-06-24 ENCOUNTER — Emergency Department (HOSPITAL_COMMUNITY): Payer: Medicare Other

## 2022-06-24 DIAGNOSIS — Z79899 Other long term (current) drug therapy: Secondary | ICD-10-CM

## 2022-06-24 DIAGNOSIS — I5042 Chronic combined systolic (congestive) and diastolic (congestive) heart failure: Secondary | ICD-10-CM | POA: Diagnosis present

## 2022-06-24 DIAGNOSIS — E1165 Type 2 diabetes mellitus with hyperglycemia: Secondary | ICD-10-CM | POA: Diagnosis present

## 2022-06-24 DIAGNOSIS — I110 Hypertensive heart disease with heart failure: Secondary | ICD-10-CM | POA: Diagnosis present

## 2022-06-24 DIAGNOSIS — E1169 Type 2 diabetes mellitus with other specified complication: Secondary | ICD-10-CM | POA: Diagnosis present

## 2022-06-24 DIAGNOSIS — Z886 Allergy status to analgesic agent status: Secondary | ICD-10-CM

## 2022-06-24 DIAGNOSIS — Z833 Family history of diabetes mellitus: Secondary | ICD-10-CM

## 2022-06-24 DIAGNOSIS — Z91013 Allergy to seafood: Secondary | ICD-10-CM

## 2022-06-24 DIAGNOSIS — I5043 Acute on chronic combined systolic (congestive) and diastolic (congestive) heart failure: Secondary | ICD-10-CM | POA: Diagnosis not present

## 2022-06-24 DIAGNOSIS — I160 Hypertensive urgency: Secondary | ICD-10-CM | POA: Diagnosis present

## 2022-06-24 DIAGNOSIS — Z6838 Body mass index (BMI) 38.0-38.9, adult: Secondary | ICD-10-CM

## 2022-06-24 DIAGNOSIS — Z7901 Long term (current) use of anticoagulants: Secondary | ICD-10-CM

## 2022-06-24 DIAGNOSIS — E441 Mild protein-calorie malnutrition: Secondary | ICD-10-CM | POA: Diagnosis present

## 2022-06-24 DIAGNOSIS — R17 Unspecified jaundice: Secondary | ICD-10-CM | POA: Diagnosis present

## 2022-06-24 DIAGNOSIS — G4733 Obstructive sleep apnea (adult) (pediatric): Secondary | ICD-10-CM | POA: Diagnosis present

## 2022-06-24 DIAGNOSIS — Z86718 Personal history of other venous thrombosis and embolism: Secondary | ICD-10-CM

## 2022-06-24 DIAGNOSIS — I2489 Other forms of acute ischemic heart disease: Secondary | ICD-10-CM | POA: Diagnosis present

## 2022-06-24 DIAGNOSIS — R7989 Other specified abnormal findings of blood chemistry: Secondary | ICD-10-CM

## 2022-06-24 DIAGNOSIS — K3184 Gastroparesis: Secondary | ICD-10-CM | POA: Diagnosis present

## 2022-06-24 DIAGNOSIS — I513 Intracardiac thrombosis, not elsewhere classified: Secondary | ICD-10-CM | POA: Diagnosis present

## 2022-06-24 DIAGNOSIS — Z1152 Encounter for screening for COVID-19: Secondary | ICD-10-CM

## 2022-06-24 DIAGNOSIS — G8929 Other chronic pain: Secondary | ICD-10-CM | POA: Diagnosis present

## 2022-06-24 DIAGNOSIS — Z91148 Patient's other noncompliance with medication regimen for other reason: Secondary | ICD-10-CM

## 2022-06-24 DIAGNOSIS — E669 Obesity, unspecified: Secondary | ICD-10-CM | POA: Diagnosis present

## 2022-06-24 DIAGNOSIS — E785 Hyperlipidemia, unspecified: Secondary | ICD-10-CM | POA: Diagnosis present

## 2022-06-24 DIAGNOSIS — D638 Anemia in other chronic diseases classified elsewhere: Secondary | ICD-10-CM | POA: Diagnosis present

## 2022-06-24 DIAGNOSIS — E871 Hypo-osmolality and hyponatremia: Secondary | ICD-10-CM

## 2022-06-24 DIAGNOSIS — I252 Old myocardial infarction: Secondary | ICD-10-CM

## 2022-06-24 DIAGNOSIS — Z888 Allergy status to other drugs, medicaments and biological substances status: Secondary | ICD-10-CM

## 2022-06-24 DIAGNOSIS — Z794 Long term (current) use of insulin: Secondary | ICD-10-CM

## 2022-06-24 DIAGNOSIS — E1143 Type 2 diabetes mellitus with diabetic autonomic (poly)neuropathy: Secondary | ICD-10-CM | POA: Diagnosis present

## 2022-06-24 DIAGNOSIS — E66812 Obesity, class 2: Secondary | ICD-10-CM | POA: Diagnosis present

## 2022-06-24 DIAGNOSIS — Z91119 Patient's noncompliance with dietary regimen due to unspecified reason: Secondary | ICD-10-CM

## 2022-06-24 DIAGNOSIS — Z7984 Long term (current) use of oral hypoglycemic drugs: Secondary | ICD-10-CM

## 2022-06-24 DIAGNOSIS — Z8249 Family history of ischemic heart disease and other diseases of the circulatory system: Secondary | ICD-10-CM

## 2022-06-24 DIAGNOSIS — R079 Chest pain, unspecified: Principal | ICD-10-CM

## 2022-06-24 DIAGNOSIS — E611 Iron deficiency: Secondary | ICD-10-CM | POA: Diagnosis present

## 2022-06-24 DIAGNOSIS — I480 Paroxysmal atrial fibrillation: Secondary | ICD-10-CM | POA: Diagnosis present

## 2022-06-24 DIAGNOSIS — R112 Nausea with vomiting, unspecified: Principal | ICD-10-CM | POA: Diagnosis present

## 2022-06-24 DIAGNOSIS — E11649 Type 2 diabetes mellitus with hypoglycemia without coma: Secondary | ICD-10-CM | POA: Diagnosis present

## 2022-06-24 DIAGNOSIS — I2510 Atherosclerotic heart disease of native coronary artery without angina pectoris: Secondary | ICD-10-CM | POA: Diagnosis present

## 2022-06-24 LAB — CBG MONITORING, ED: Glucose-Capillary: 468 mg/dL — ABNORMAL HIGH (ref 70–99)

## 2022-06-24 LAB — LIPASE, BLOOD: Lipase: 24 U/L (ref 11–51)

## 2022-06-24 LAB — CBC WITH DIFFERENTIAL/PLATELET
Abs Immature Granulocytes: 0.03 10*3/uL (ref 0.00–0.07)
Basophils Absolute: 0.1 10*3/uL (ref 0.0–0.1)
Basophils Relative: 1 %
Eosinophils Absolute: 0 10*3/uL (ref 0.0–0.5)
Eosinophils Relative: 0 %
HCT: 36.4 % (ref 36.0–46.0)
Hemoglobin: 11.3 g/dL — ABNORMAL LOW (ref 12.0–15.0)
Immature Granulocytes: 0 %
Lymphocytes Relative: 30 %
Lymphs Abs: 2.5 10*3/uL (ref 0.7–4.0)
MCH: 21.5 pg — ABNORMAL LOW (ref 26.0–34.0)
MCHC: 31 g/dL (ref 30.0–36.0)
MCV: 69.2 fL — ABNORMAL LOW (ref 80.0–100.0)
Monocytes Absolute: 0.6 10*3/uL (ref 0.1–1.0)
Monocytes Relative: 7 %
Neutro Abs: 5.2 10*3/uL (ref 1.7–7.7)
Neutrophils Relative %: 62 %
Platelets: 301 10*3/uL (ref 150–400)
RBC: 5.26 MIL/uL — ABNORMAL HIGH (ref 3.87–5.11)
RDW: 21.2 % — ABNORMAL HIGH (ref 11.5–15.5)
WBC: 8.5 10*3/uL (ref 4.0–10.5)
nRBC: 0 % (ref 0.0–0.2)

## 2022-06-24 LAB — URINALYSIS, ROUTINE W REFLEX MICROSCOPIC
Glucose, UA: >=500 mg/dL — AB
Ketones, ur: 5 mg/dL — AB
Leukocytes,Ua: NEGATIVE
Nitrite: NEGATIVE
Protein, ur: 100 mg/dL — AB
Specific Gravity, Urine: 1.015 (ref 1.005–1.030)
pH: 6 (ref 5.0–8.0)

## 2022-06-24 LAB — TROPONIN I (HIGH SENSITIVITY)
Troponin I (High Sensitivity): 99 ng/L — ABNORMAL HIGH (ref <18)
Troponin I (High Sensitivity): 87 ng/L — ABNORMAL HIGH (ref <18)

## 2022-06-24 LAB — COMPREHENSIVE METABOLIC PANEL
ALT: 14 U/L (ref 0–44)
AST: 23 U/L (ref 15–41)
Albumin: 3.1 g/dL — ABNORMAL LOW (ref 3.5–5.0)
Alkaline Phosphatase: 151 U/L — ABNORMAL HIGH (ref 38–126)
Anion gap: 13 (ref 5–15)
BUN: 14 mg/dL (ref 6–20)
CO2: 20 mmol/L — ABNORMAL LOW (ref 22–32)
Calcium: 8.4 mg/dL — ABNORMAL LOW (ref 8.9–10.3)
Chloride: 91 mmol/L — ABNORMAL LOW (ref 98–111)
Creatinine, Ser: 1.09 mg/dL — ABNORMAL HIGH (ref 0.44–1.00)
GFR, Estimated: >60 mL/min (ref >60)
Glucose, Bld: 490 mg/dL — ABNORMAL HIGH (ref 70–99)
Potassium: 3.6 mmol/L (ref 3.5–5.1)
Sodium: 124 mmol/L — ABNORMAL LOW (ref 135–145)
Total Bilirubin: 2.8 mg/dL — ABNORMAL HIGH (ref 0.3–1.2)
Total Protein: 8.1 g/dL (ref 6.5–8.1)

## 2022-06-24 LAB — RESP PANEL BY RT-PCR (FLU A&B, COVID) ARPGX2
Influenza A by PCR: NEGATIVE
Influenza B by PCR: NEGATIVE
SARS Coronavirus 2 by RT PCR: NEGATIVE

## 2022-06-24 MED ORDER — SODIUM CHLORIDE 0.9 % IV BOLUS
2000.0000 mL | Freq: Once | INTRAVENOUS | Status: AC
  Administered 2022-06-25: 2000 mL via INTRAVENOUS

## 2022-06-24 MED ORDER — ONDANSETRON 4 MG PO TBDP: 4.0000 mg | ORAL_TABLET | Freq: Once | ORAL | Status: DC

## 2022-06-24 NOTE — ED Provider Triage Note (Signed)
Emergency Medicine Provider Triage Evaluation Note  [name] , a 46 y.o. female  was evaluated in triage.  Patient presenting today with multiple complaints.  Says they have been going on for a couple of months.  Complaining of abdominal pain, nausea, vomiting and inability to keep anything down.  Also complaining of chest discomfort and some back discomfort.  Says that last time she was here "they" said that she was going to be seen and evaluated for her gallbladder but this did not happen.  Review of Systems  Positive:  Negative:   Physical Exam  BP (!) 169/125   Pulse (!) 110   Temp 98.5 F (36.9 C) (Oral)   Resp 16   LMP 04/06/2022 (Exact Date)   SpO2 99%  Gen:   Awake, no distress   Resp:  Normal effort  MSK:   Moves extremities without difficulty  Other:  Actively vomiting in triage.  Medical Decision Making  Medically screening exam initiated at 6:42 PM.  Appropriate orders placed.  [name] was informed that the remainder of the evaluation will be completed by another provider, this initial triage assessment does not replace that evaluation, and the importance of remaining in the ED until their evaluation is complete.     Tammy Hammock, PA-C 06/24/22 1843

## 2022-06-24 NOTE — ED Notes (Signed)
I made PA aware of patient b/p

## 2022-06-24 NOTE — ED Notes (Signed)
Patient transported to Ultrasound 

## 2022-06-24 NOTE — ED Triage Notes (Signed)
Patient c/o chest pain and vomiting x 1 month. Patient states every time I throw up it gets worse.

## 2022-06-25 ENCOUNTER — Encounter (HOSPITAL_COMMUNITY): Payer: Self-pay | Admitting: Family Medicine

## 2022-06-25 ENCOUNTER — Other Ambulatory Visit: Payer: Self-pay

## 2022-06-25 DIAGNOSIS — E669 Obesity, unspecified: Secondary | ICD-10-CM | POA: Diagnosis present

## 2022-06-25 DIAGNOSIS — E441 Mild protein-calorie malnutrition: Secondary | ICD-10-CM | POA: Diagnosis present

## 2022-06-25 DIAGNOSIS — E66812 Obesity, class 2: Secondary | ICD-10-CM | POA: Diagnosis present

## 2022-06-25 DIAGNOSIS — R112 Nausea with vomiting, unspecified: Secondary | ICD-10-CM | POA: Diagnosis present

## 2022-06-25 LAB — BASIC METABOLIC PANEL
Anion gap: 11 (ref 5–15)
BUN: 13 mg/dL (ref 6–20)
CO2: 22 mmol/L (ref 22–32)
Calcium: 8.4 mg/dL — ABNORMAL LOW (ref 8.9–10.3)
Chloride: 100 mmol/L (ref 98–111)
Creatinine, Ser: 0.76 mg/dL (ref 0.44–1.00)
GFR, Estimated: >60 mL/min (ref >60)
Glucose, Bld: 350 mg/dL — ABNORMAL HIGH (ref 70–99)
Potassium: 4.1 mmol/L (ref 3.5–5.1)
Sodium: 133 mmol/L — ABNORMAL LOW (ref 135–145)

## 2022-06-25 LAB — GLUCOSE, CAPILLARY: Glucose-Capillary: 232 mg/dL — ABNORMAL HIGH (ref 70–99)

## 2022-06-25 LAB — CBG MONITORING, ED
Glucose-Capillary: 362 mg/dL — ABNORMAL HIGH (ref 70–99)
Glucose-Capillary: 335 mg/dL — ABNORMAL HIGH (ref 70–99)
Glucose-Capillary: 269 mg/dL — ABNORMAL HIGH (ref 70–99)
Glucose-Capillary: 196 mg/dL — ABNORMAL HIGH (ref 70–99)

## 2022-06-25 LAB — PROTIME-INR
INR: 2.5 — ABNORMAL HIGH (ref 0.8–1.2)
Prothrombin Time: 26.9 seconds — ABNORMAL HIGH (ref 11.4–15.2)

## 2022-06-25 LAB — OSMOLALITY: Osmolality: 296 mOsm/kg — ABNORMAL HIGH (ref 275–295)

## 2022-06-25 LAB — TSH: TSH: 1.887 u[IU]/mL (ref 0.350–4.500)

## 2022-06-25 MED ORDER — ATORVASTATIN CALCIUM 40 MG PO TABS: 40.0000 mg | ORAL_TABLET | ORAL | Status: DC

## 2022-06-25 MED ORDER — POTASSIUM CHLORIDE IN NACL 20-0.9 MEQ/L-% IV SOLN
INTRAVENOUS | Status: DC
  Filled 2022-06-25: qty 1000

## 2022-06-25 MED ORDER — PANTOPRAZOLE SODIUM 40 MG IV SOLR
40.0000 mg | Freq: Once | INTRAVENOUS | Status: AC
  Administered 2022-06-25: 40 mg via INTRAVENOUS
  Filled 2022-06-25: qty 10

## 2022-06-25 MED ORDER — PROCHLORPERAZINE EDISYLATE 10 MG/2ML IJ SOLN
10.0000 mg | Freq: Once | INTRAMUSCULAR | Status: AC
  Administered 2022-06-25: 10 mg via INTRAVENOUS
  Filled 2022-06-25: qty 2

## 2022-06-25 MED ORDER — OXYCODONE HCL 5 MG PO TABS
5.0000 mg | ORAL_TABLET | Freq: Four times a day (QID) | ORAL | Status: DC | PRN
  Administered 2022-06-25 (×2): 5 mg via ORAL
  Filled 2022-06-25 (×2): qty 1

## 2022-06-25 MED ORDER — ACETAMINOPHEN 650 MG RE SUPP: 650.0000 mg | Freq: Four times a day (QID) | RECTAL | Status: DC | PRN

## 2022-06-25 MED ORDER — CARVEDILOL 3.125 MG PO TABS
3.1250 mg | ORAL_TABLET | Freq: Two times a day (BID) | ORAL | Status: DC
  Administered 2022-06-25 – 2022-06-30 (×11): 3.125 mg via ORAL
  Filled 2022-06-25 (×11): qty 1

## 2022-06-25 MED ORDER — INSULIN ASPART 100 UNIT/ML IJ SOLN
0.0000 [IU] | Freq: Every day | INTRAMUSCULAR | Status: DC
  Administered 2022-06-25: 2 [IU] via SUBCUTANEOUS
  Filled 2022-06-25: qty 0.05

## 2022-06-25 MED ORDER — SPIRONOLACTONE 12.5 MG HALF TABLET
12.5000 mg | ORAL_TABLET | Freq: Every day | ORAL | Status: DC
  Administered 2022-06-25 – 2022-07-01 (×7): 12.5 mg via ORAL
  Filled 2022-06-25 (×7): qty 1

## 2022-06-25 MED ORDER — SODIUM CHLORIDE 0.9 % IV BOLUS: 1000.0000 mL | Freq: Once | INTRAVENOUS | Status: DC

## 2022-06-25 MED ORDER — WARFARIN SODIUM 10 MG PO TABS
11.2500 mg | ORAL_TABLET | Freq: Once | ORAL | Status: AC
  Administered 2022-06-25: 11.25 mg via ORAL
  Filled 2022-06-25: qty 1

## 2022-06-25 MED ORDER — WARFARIN - PHARMACIST DOSING INPATIENT: Freq: Every day | Status: DC

## 2022-06-25 MED ORDER — TRAZODONE HCL 50 MG PO TABS
50.0000 mg | ORAL_TABLET | Freq: Every day | ORAL | Status: DC
  Administered 2022-06-25 – 2022-06-30 (×6): 50 mg via ORAL
  Filled 2022-06-25 (×7): qty 1

## 2022-06-25 MED ORDER — MORPHINE SULFATE (PF) 4 MG/ML IV SOLN
4.0000 mg | Freq: Once | INTRAVENOUS | Status: AC
  Administered 2022-06-25: 4 mg via INTRAVENOUS
  Filled 2022-06-25: qty 1

## 2022-06-25 MED ORDER — SENNOSIDES-DOCUSATE SODIUM 8.6-50 MG PO TABS
1.0000 | ORAL_TABLET | Freq: Two times a day (BID) | ORAL | Status: DC
  Administered 2022-06-25 – 2022-06-30 (×9): 1 via ORAL
  Filled 2022-06-25 (×12): qty 1

## 2022-06-25 MED ORDER — LOSARTAN POTASSIUM 50 MG PO TABS
25.0000 mg | ORAL_TABLET | Freq: Every day | ORAL | Status: DC
  Administered 2022-06-25 – 2022-06-30 (×6): 25 mg via ORAL
  Filled 2022-06-25 (×6): qty 1

## 2022-06-25 MED ORDER — POLYETHYLENE GLYCOL 3350 17 G PO PACK: 17.0000 g | PACK | Freq: Every day | ORAL | Status: DC | PRN

## 2022-06-25 MED ORDER — LABETALOL HCL 5 MG/ML IV SOLN
20.0000 mg | INTRAVENOUS | Status: DC | PRN
  Administered 2022-06-25: 20 mg via INTRAVENOUS
  Filled 2022-06-25: qty 4

## 2022-06-25 MED ORDER — ONDANSETRON HCL 4 MG/2ML IJ SOLN
4.0000 mg | Freq: Four times a day (QID) | INTRAMUSCULAR | Status: DC | PRN
  Administered 2022-06-25 – 2022-06-26 (×3): 4 mg via INTRAVENOUS
  Filled 2022-06-25 (×3): qty 2

## 2022-06-25 MED ORDER — INSULIN DETEMIR 100 UNIT/ML ~~LOC~~ SOLN
30.0000 [IU] | Freq: Every day | SUBCUTANEOUS | Status: DC
  Administered 2022-06-25: 30 [IU] via SUBCUTANEOUS
  Filled 2022-06-25 (×2): qty 0.3

## 2022-06-25 MED ORDER — ACETAMINOPHEN 325 MG PO TABS: 650.0000 mg | ORAL_TABLET | Freq: Four times a day (QID) | ORAL | Status: DC | PRN

## 2022-06-25 MED ORDER — INSULIN ASPART 100 UNIT/ML IJ SOLN
0.0000 [IU] | Freq: Three times a day (TID) | INTRAMUSCULAR | Status: DC
  Administered 2022-06-25: 8 [IU] via SUBCUTANEOUS
  Administered 2022-06-25: 3 [IU] via SUBCUTANEOUS
  Administered 2022-06-25: 11 [IU] via SUBCUTANEOUS
  Administered 2022-06-26: 8 [IU] via SUBCUTANEOUS
  Administered 2022-06-26: 3 [IU] via SUBCUTANEOUS
  Administered 2022-06-26: 5 [IU] via SUBCUTANEOUS
  Administered 2022-06-27 (×2): 3 [IU] via SUBCUTANEOUS
  Filled 2022-06-25: qty 0.15

## 2022-06-25 MED ORDER — FUROSEMIDE 40 MG PO TABS
80.0000 mg | ORAL_TABLET | Freq: Every morning | ORAL | Status: DC
  Administered 2022-06-25 – 2022-06-28 (×4): 80 mg via ORAL
  Filled 2022-06-25 (×4): qty 2

## 2022-06-25 MED ORDER — PANTOPRAZOLE SODIUM 40 MG PO TBEC
40.0000 mg | DELAYED_RELEASE_TABLET | Freq: Every day | ORAL | Status: DC
  Administered 2022-06-26 – 2022-07-01 (×6): 40 mg via ORAL
  Filled 2022-06-25 (×7): qty 1

## 2022-06-25 NOTE — ED Notes (Signed)
Attempted to straight stick for labs, pt would not sit still, pulled her hand away

## 2022-06-25 NOTE — ED Notes (Signed)
Remain unable to get IV access.  Order has been placed for IV team.  Delay in medications due to patient not having IV access.

## 2022-06-25 NOTE — H&P (Signed)
History and Physical    Patient: [name]:[id number] DOB: [birth year] DOA: 06/24/2022 DOS: the patient was seen and examined on 06/25/2022 PCP: Donney Dice, DO  Patient coming from: Home  Chief Complaint:  Chief Complaint  Patient presents with   Chest Pain   Emesis   HPI: [name] is a 46 y.o. female with medical history significant of abnormal Pap smear, chronic combined systolic and diastolic heart failure, normocytic anemia, asthma, left ventricle thrombus, paroxysmal atrial fibrillation on warfarin, CAD, history NSTEMI, chronic elevated troponin,type 2 diabetes, history of episodes of DKA, hydrosalpinx, polycystic ovarian disease, hypertension, episodes of hypertensive emergency, depression, migraine headaches, morbid obesity currently class II obesity with a BMI of 38.17 kg/m, obstructive sleep apnea not on CPAP who presented to the emergency department due to chest pain, nausea and vomiting for a month.  She was seen by GI (Dr. Candis Schatz) back in April with workup showing gastritis and no H. pylori.  No diarrhea, constipation, melena or hematochezia.   No fever, chills or night sweats. No sore throat, rhinorrhea, dyspnea, wheezing or hemoptysis.  No palpitations, diaphoresis, PND, orthopnea, but gets frequent pitting edema of the lower extremities. No flank pain, dysuria, frequency or hematuria.  No polyuria, polydipsia, polyphagia or blurred vision.  ED course: Initial vital signs were temperature 98.5 F, pulse 110, respiration 16, BP 159/125 mmHg O2 sat 99% on room air.  The patient received morphine 4 mg IVP, pantoprazole 40 mg IVP, and 2000 mL of normal saline bolus.    Lab work: Urinalysis shows glucosuria more than 500, small hemoglobinuria, small bilirubinuria, ketonuria 5 and proteinuria 100 mg/dL.  There were rare bacteria microscopic examination.  Coronavirus and influenza PCR were negative.  CBC with a white count of 8.5, hemoglobin 11.3 g/dL platelets 301.   Lipase was normal.  Troponin was 99, then 87 ng/L.  TSH was normal.  CMP with a sodium 124, potassium 3.6, chloride 91 and CO2 20 mmol/L.  Glucose 490, BUN 14 and creatinine 1.09 mg/deciliter.  Calcium is normal after correction.  Albumin is 3.1 g/dL, alkaline phosphatase 151 and total bilirubin 2.8 mg/dL.  The rest of the LFTs were normal.  BMP this morning with improved sodium 133 mmol/L, which normalizes after correction to glucose level of 350 mg/deciliter.  Imaging: RUQ [place] ultrasound was unremarkable.   Review of Systems: As mentioned in the history of present illness. All other systems reviewed and are negative. Past Medical History:  Diagnosis Date   Abnormal Pap smear    Acute heart failure ([place]) 01/29/2022   Acute on chronic combined systolic and diastolic CHF (congestive heart failure) ([place]) 02/19/2019   Acute on chronic congestive heart failure (HCC)    Acute on chronic congestive heart failure (HCC)    Acute on chronic diastolic CHF (congestive heart failure) ([place]) 10/19/2021   Acute on chronic systolic heart failure ([place]) 02/22/2022   Anemia    Asthma    Atrial fibrillation ([place])    history of paroxysmal atrial fibrillation   CHF (congestive heart failure) ([place]) 11/02/2021   CHF exacerbation ([place]) 10/17/2021   Diabetes mellitus    DKA, type 2 ([place]) 02/06/2019   Elevated troponin 09/30/2019   Heart failure with reduced ejection fraction ([place])    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  Consider chronic etiology given continued fertility issues   Hypertension    Hypertensive emergency 09/30/2019   Major depressive  disorder 02/08/2012   Reports that 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     Migraines    Morbid obesity ([place]) 02/06/2019   Nonischemic cardiomyopathy ([place]) 09/22/2021   NSTEMI (non-ST elevated myocardial infarction) ([place]) 02/23/2022   Obstructive sleep  apnea 06/30/2015   Polycystic ovarian disease    Past Surgical History:  Procedure Laterality Date   BIOPSY  11/05/2021   Procedure: BIOPSY;  Surgeon: Daryel November, MD;  Location[facility] ENDOSCOPY;  Service: Gastroenterology;;   CERVIX LESION DESTRUCTION     CESAREAN SECTION     ESOPHAGOGASTRODUODENOSCOPY (EGD) WITH PROPOFOL N/A 11/05/2021   Procedure: ESOPHAGOGASTRODUODENOSCOPY (EGD) WITH PROPOFOL;  Surgeon: Daryel November, MD;  Location: [place];  Service: Gastroenterology;  Laterality: N/A;   RIGHT/LEFT HEART CATH AND CORONARY ANGIOGRAPHY N/A 03/29/2019   Procedure: RIGHT/LEFT HEART CATH AND CORONARY ANGIOGRAPHY;  Surgeon: Larey Dresser, MD;  Location: [place] CV LAB;  Service: Cardiovascular;  Laterality: N/A;   RIGHT/LEFT HEART CATH AND CORONARY ANGIOGRAPHY N/A 09/20/2021   Procedure: RIGHT/LEFT HEART CATH AND CORONARY ANGIOGRAPHY;  Surgeon: Larey Dresser, MD;  Location: [place] [facility] LAB;  Service: Cardiovascular;  Laterality: N/A;   Social History:  reports that she has never smoked. She has never used smokeless tobacco. She reports that she does not drink alcohol and does not use drugs.  Allergies  Allergen Reactions   Fish Allergy Swelling   Shellfish Allergy Swelling    Airway involvement including EMS - Has Epi-Pen   Metformin And Related Diarrhea   Trulicity [Dulaglutide] Nausea And Vomiting and Other (See Comments)    Multiple episodes of vomiting over multiple days   Nsaids Other (See Comments)    Per PCP interferes with daily meds (heart failure)   Advil [Ibuprofen] Other (See Comments)    Per PCP interferes with daily meds (heart failure)    Family History  Problem Relation Age of Onset   Diabetes Mother    Hypertension Mother    Hypertension Father    Diabetes Father     Prior to Admission medications   Medication Sig Start Date End Date Taking? Authorizing Provider  Accu-Chek Softclix Lancets lancets Use as instructed 02/12/19   Caroline More, DO  Accu-Chek Softclix Lancets lancets Use up 4 (four) times daily as directed 06/06/22   [place] Donnamarie Poag, DO  acetaminophen (TYLENOL) 325 MG tablet Take 2 tablets (650 mg total) by mouth every 6 (six) hours as needed for mild pain (or Fever >/= 101). 02/27/22   Oswald Hillock, MD  atorvastatin (LIPITOR) 40 MG tablet TAKE 1 TABLET BY MOUTH DAILY AT 6 PM. Patient taking differently: Take 40 mg by mouth every Monday, Wednesday, and Friday. 04/01/20 08/27/22  Larey Dresser, MD  blood glucose meter kit and supplies Use up to 4 (four) times daily as directed 06/06/22   [place] Donnamarie Poag, DO  carvedilol (COREG) 3.125 MG tablet Take 1 tablet (3.125 mg total) by mouth 2 (two) times daily with a meal. 02/27/22   Oswald Hillock, MD  empagliflozin (JARDIANCE) 10 MG TABS tablet Take 1 tablet (10 mg total) by mouth daily. 11/08/21   Sharion Settler, DO  furosemide (LASIX) 80 MG tablet Take 1 tablet (80 mg total) by mouth every morning. 05/09/22   Darci Current, DO  glucose blood [facility] VERIO) test strip Use up to 4 (four) times daily as directed 06/06/22   [place] Donnamarie Poag,  DO  hydrOXYzine (ATARAX) 10 MG tablet Take 1 tablet (10 mg total) by mouth 3 (three) times daily as needed. Patient taking differently: Take 10 mg by mouth 3 (three) times daily as needed for anxiety. 10/22/21   Gladys Damme, MD  insulin aspart (NOVOLOG FLEXPEN) 100 UNIT/ML FlexPen Inject 12 Units into the skin daily before supper. Patient taking differently: Inject 36 Units into the skin daily before supper. 09/02/21   Zenia Resides, MD  insulin glargine (LANTUS) 100 UNIT/ML Solostar Pen Inject 36 Units into the skin every morning. Patient taking differently: Inject 12 Units into the skin every morning. 09/02/21   Zenia Resides, MD  Insulin Pen Needle (UNIFINE PENTIPS) 31G X 5 MM MISC USE AS DIRECTED 2 TIMES DAILY 09/02/21   Zenia Resides, MD  losartan (COZAAR) 25 MG tablet Take 1 tablet (25 mg total) by mouth daily. 02/28/22    Oswald Hillock, MD  ondansetron (ZOFRAN-ODT) 4 MG disintegrating tablet Dissolve 1 tablet (4 mg total) by mouth every 8 (eight) hours as needed for nausea or vomiting. 06/06/22   [place] Donnamarie Poag, DO  oxyCODONE (OXY IR/ROXICODONE) 5 MG immediate release tablet Take 1 tablet (5 mg total) by mouth every 6 (six) hours as needed for moderate pain. 06/06/22   [place] Eric J, DO  polyethylene glycol (MIRALAX / GLYCOLAX) 17 g packet Take 17 g by mouth daily as needed for mild constipation. 06/06/22   [place] Eric J, DO  senna-docusate (SENOKOT-S) 8.6-50 MG tablet Take 1 tablet by mouth 2 (two) times daily. 06/06/22 09/04/22  [place] Donnamarie Poag, DO  spironolactone (ALDACTONE) 25 MG tablet Take 0.5 tablets (12.5 mg total) by mouth daily. 11/08/21   Sharion Settler, DO  traZODone (DESYREL) 50 MG tablet Take 1 tablet (50 mg total) by mouth at bedtime. 05/09/22   Darci Current, DO  warfarin (COUMADIN) 7.5 MG tablet Take 1-2 tablets (7.5-15 mg total) by mouth daily as directed by [facility] Patient taking differently: Take 7.5-15 mg by mouth See admin instructions. Take 15 mg by mouth every day except for Monday. On Monday's, take 7.5 mg 05/02/22   Janina Mayo, MD    Physical Exam: Vitals:   06/25/22 0515 06/25/22 0530 06/25/22 0643 06/25/22 0840  BP: (!) 205/140 (!) 203/147  (!) 195/146  Pulse:  (!) 102  (!) 108  Resp:  16  20  Temp:   97.6 F (36.4 C)   TempSrc:   Oral   SpO2:  95%  99%  Weight:      Height:       Physical Exam Vitals and nursing note reviewed.  Constitutional:      General: She is awake. She is not in acute distress.    Appearance: She is well-developed. She is obese. She is ill-appearing.     Comments: Chronically ill-appearing.  HENT:     Head: Normocephalic.     Nose: No rhinorrhea.     Mouth/Throat:     Mouth: Mucous membranes are dry.  Eyes:     General: No scleral icterus.    Pupils: Pupils are equal, round, and reactive to light.  Neck:     Vascular: No JVD.   Cardiovascular:     Rate and Rhythm: Regular rhythm. Tachycardia present.  Pulmonary:     Effort: Pulmonary effort is normal.     Breath sounds: No wheezing, rhonchi or rales.  Abdominal:     General: Bowel sounds are normal. There is no distension.  Palpations: Abdomen is soft.     Tenderness: There is no abdominal tenderness. There is no guarding.  Musculoskeletal:     Cervical back: Neck supple.     Right lower leg: Edema present.     Left lower leg: Edema present.  Skin:    General: Skin is warm and dry.  Neurological:     General: No focal deficit present.     Mental Status: She is alert and oriented to person, place, and time.  Psychiatric:        Mood and Affect: Mood normal.     Data Reviewed:  Results are pending, will review when available.  02/23/2022 echocardiogram. IMPRESSIONS    1. 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 Left  ventricular ejection fraction, by estimation, is <20%. The left ventricle  has severely decreased function. The left ventricle demonstrates global  hypokinesis. The left ventricular  internal cavity size was mildly dilated. Left ventricular diastolic  parameters are consistent with Grade III diastolic dysfunction  (restrictive).   2. Right ventricular systolic function is moderately reduced. The right  ventricular size is normal. There is mildly elevated pulmonary artery  systolic pressure. The estimated right ventricular systolic pressure is  78.2 mmHg.   3. Left atrial size was mildly dilated.   4. The mitral valve is normal in structure. Mild mitral valve  regurgitation. No evidence of mitral stenosis.   5. Tricuspid valve regurgitation is mild to moderate.   6. The aortic valve is tricuspid. Aortic valve regurgitation is not  visualized. No aortic stenosis is present.   7. The inferior vena cava is normal in size with <50% respiratory  variability, suggesting right atrial pressure of 8 mmHg.   Vent.  rate 115 BPM PR interval 134 ms QRS duration 84 ms QT/QTcB 338/467 ms P-R-T axes 54 12 3 Sinus tachycardia Minimal voltage criteria for LVH, may be normal variant ( Cornell product ) Possible Lateral infarct , age undetermined Abnormal ECG  Assessment and Plan: Principal Problem:   Hyperbilirubinemia Associated with:   Nausea and vomiting Observation/MedSurg. Tolerating p.o. now. Given IVF in ED. Will hold due to CHF. Analgesics as needed. Antiemetics as needed. Pantoprazole 40 mg IVP x 1. Continue pantoprazole 40 mg p.o. daily. Keep electrolytes optimized. Follow-up CBC and CMP in AM. This is a chronic issue. Gastroenterology on-call did not have any new recommendations. Dr. Watt Climes stated to call GI if no improvement.  Active Problems:   Poorly controlled type 2 diabetes mellitus (HCC) Carbohydrate modified diet. Continue Levemir 30 units at bedtime. CBG monitoring before meals and bedtime.    Anemia of chronic disease Monitor hematocrit and hemoglobin.    Obstructive sleep apnea Has not followed as an outpatient. CPAP while in the hospital. Needs to follow-up with PCP.    Hyperlipidemia associated with type 2 diabetes mellitus ([place]) Will hold atorvastatin for now.    Chronic combined systolic and diastolic heart failure (HCC) Initially volume depleted. Has received over 2 L of fluid. Will hold further IVF for now. Resume furosemide in AM. Given severity: Long-term prognosis poor.    Paroxysmal atrial fibrillation (HCC) CHA2DS2-VASc Score of at least 5. Continue carvedilol 3.125 mg p.o. twice daily. Continue warfarin per pharmacy.    Chronic chest wall pain No uptrend on troponin. Analgesics as needed.    History of left ventricular mural thrombus Warfarin per pharmacy. Follow-up with cardiology as an outpatient.    Hypertensive urgency Continue home antihypertensives. Labetalol 20  mg IVP as needed q 2hr.    Mild protein malnutrition  (HCC) Protein supplementation.    Class 2 obesity Current BMI 38.17 kg/m. Lifestyle modifications. Follow-up with primary care provider or bariatric clinic.       Advance Care Planning:   Code Status: Full code.  Consults:   Family Communication:   Severity of Illness: The appropriate patient status for this patient is INPATIENT. Inpatient status is judged to be reasonable and necessary in order to provide the required intensity of service to ensure the patient's safety. The patient's presenting symptoms, physical exam findings, and initial radiographic and laboratory data in the context of their chronic comorbidities is felt to place them at high risk for further clinical deterioration. Furthermore, it is not anticipated that the patient will be medically stable for discharge from the hospital within 2 midnights of admission.   * I certify that at the point of admission it is my clinical judgment that the patient will require inpatient hospital care spanning beyond 2 midnights from the point of admission due to high intensity of service, high risk for further deterioration and high frequency of surveillance required.*  Author: Reubin Milan, MD 06/25/2022 9:10 AM  For on call review [email or web address].   This document was prepared using Dragon voice recognition software and may contain some unintended transcription errors.

## 2022-06-25 NOTE — ED Provider Notes (Signed)
[facility] Emergency Department Provider Note MRN:  [id number]  Arrival date & time: 06/25/22     Chief Complaint   Chest Pain and Emesis   History of Present Illness   [name] is a 46 y.o. year-old female with a history of CHF, CAD, diabetes presenting to the ED with chief complaint of chest pain.  Chest pain, epigastric pain, vomiting for the past few days.  No fever, no lower abdominal pain, feels generally unwell.  Review of Systems  A thorough review of systems was obtained and all systems are negative except as noted in the HPI and PMH.   Patient's Health History    Past Medical History:  Diagnosis Date   Abnormal Pap smear    Acute heart failure ([place]) 01/29/2022   Acute on chronic combined systolic and diastolic CHF (congestive heart failure) ([place]) 02/19/2019   Acute on chronic congestive heart failure (HCC)    Acute on chronic congestive heart failure (HCC)    Acute on chronic diastolic CHF (congestive heart failure) ([place]) 10/19/2021   Acute on chronic systolic heart failure ([place]) 02/22/2022   Anemia    Asthma    Atrial fibrillation (HCC)    history of paroxysmal atrial fibrillation   CHF (congestive heart failure) ([place]) 11/02/2021   CHF exacerbation ([place]) 10/17/2021   Diabetes mellitus    DKA, type 2 ([place]) 02/06/2019   Elevated troponin 09/30/2019   Heart failure with reduced ejection fraction ([place])    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  Consider chronic etiology given continued fertility issues   Hypertension    Hypertensive emergency 09/30/2019   Major depressive disorder 02/08/2012   Reports that 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     Migraines    Morbid obesity ([place]) 02/06/2019   Nonischemic cardiomyopathy ([place]) 09/22/2021   NSTEMI (non-ST elevated myocardial infarction) ([place]) 02/23/2022   Obstructive  sleep apnea 06/30/2015   Polycystic ovarian disease     Past Surgical History:  Procedure Laterality Date   BIOPSY  11/05/2021   Procedure: BIOPSY;  Surgeon: Daryel November, MD;  Location: [facility] ENDOSCOPY;  Service: Gastroenterology;;   CERVIX LESION DESTRUCTION     CESAREAN SECTION     ESOPHAGOGASTRODUODENOSCOPY (EGD) WITH PROPOFOL N/A 11/05/2021   Procedure: ESOPHAGOGASTRODUODENOSCOPY (EGD) WITH PROPOFOL;  Surgeon: Daryel November, MD;  Location: [place];  Service: Gastroenterology;  Laterality: N/A;   RIGHT/LEFT HEART CATH AND CORONARY ANGIOGRAPHY N/A 03/29/2019   Procedure: RIGHT/LEFT HEART CATH AND CORONARY ANGIOGRAPHY;  Surgeon: Larey Dresser, MD;  Location: [place] CV LAB;  Service: Cardiovascular;  Laterality: N/A;   RIGHT/LEFT HEART CATH AND CORONARY ANGIOGRAPHY N/A 09/20/2021   Procedure: RIGHT/LEFT HEART CATH AND CORONARY ANGIOGRAPHY;  Surgeon: Larey Dresser, MD;  Location: [place] CV LAB;  Service: Cardiovascular;  Laterality: N/A;    Family History  Problem Relation Age of Onset   Diabetes Mother    Hypertension Mother    Hypertension Father    Diabetes Father     Social History   Socioeconomic History   Marital status: Single    Spouse name: Not on file   Number of children: Not on file   Years of education: Not on file   Highest education level: Not on file  Occupational History   Not on file  Tobacco Use  Smoking status: Never   Smokeless tobacco: Never   Tobacco comments:    Never smoke 10/22/21  Vaping Use   Vaping Use: Never used  Substance and Sexual Activity   Alcohol use: No    Alcohol/week: 0.0 standard drinks of alcohol   Drug use: No   Sexual activity: Yes  Other Topics Concern   Not on file  Social History Narrative   Not on file   Social Determinants of Health   Financial Resource Strain: High Risk (10/17/2019)   Overall Financial Resource Strain (CARDIA)    Difficulty of Paying Living Expenses: Hard  Food  Insecurity: No Food Insecurity (06/03/2022)   Hunger Vital Sign    Worried About Running Out of Food in the Last Year: Never true    Ran Out of Food in the Last Year: Never true  Transportation Needs: Unmet Transportation Needs (06/03/2022)   PRAPARE - Hydrologist (Medical): Yes    Lack of Transportation (Non-Medical): No  Physical Activity: Insufficiently Active (08/19/2020)   Exercise Vital Sign    Days of Exercise per Week: 7 days    Minutes of Exercise per Session: 20 min  Stress: Stress Concern Present (08/19/2020)   [place]    Feeling of Stress : Very much  Social Connections: Not on file  Intimate Partner Violence: Not At Risk (06/03/2022)   Humiliation, Afraid, Rape, and Kick questionnaire    Fear of Current or Ex-Partner: No    Emotionally Abused: No    Physically Abused: No    Sexually Abused: No     Physical Exam   Vitals:   06/24/22 1803 06/24/22 1828  BP: (!) 159/125 (!) 169/125  Pulse: (!) 110   Resp: 16   Temp: 98.5 F (36.9 C)   SpO2: 99%     CONSTITUTIONAL: Well-appearing, NAD NEURO/PSYCH:  Alert and oriented x 3, no focal deficits EYES:  eyes equal and reactive ENT/NECK:  no LAD, no JVD CARDIO: Regular rate, well-perfused, normal S1 and S2 PULM:  CTAB no wheezing or rhonchi GI/GU:  non-distended, non-tender MSK/SPINE:  No gross deformities, no edema SKIN:  no rash, atraumatic   *Additional and/or pertinent findings included in MDM below  Diagnostic and Interventional Summary    EKG Interpretation  Date/Time:  Friday June 24 2022 18:17:40 EST Ventricular Rate:  115 PR Interval:  134 QRS Duration: 84 QT Interval:  338 QTC Calculation: 467 R Axis:   12 Text Interpretation: Sinus tachycardia Minimal voltage criteria for LVH, may be normal variant ( Cornell product ) Possible Lateral infarct , age undetermined Abnormal ECG When compared with ECG  of 02-Jun-2022 14:40, PREVIOUS ECG IS PRESENT Confirmed by Gerlene Fee [phone]) on 06/25/2022 12:31:57 AM       Labs Reviewed  CBC WITH DIFFERENTIAL/PLATELET - Abnormal; Notable for the following components:      Result Value   RBC 5.26 (*)    Hemoglobin 11.3 (*)    MCV 69.2 (*)    MCH 21.5 (*)    RDW 21.2 (*)    All other components within normal limits  COMPREHENSIVE METABOLIC PANEL - Abnormal; Notable for the following components:   Sodium 124 (*)    Chloride 91 (*)    CO2 20 (*)    Glucose, Bld 490 (*)    Creatinine, Ser 1.09 (*)    Calcium 8.4 (*)    Albumin 3.1 (*)    Alkaline Phosphatase  151 (*)    Total Bilirubin 2.8 (*)    All other components within normal limits  URINALYSIS, ROUTINE W REFLEX MICROSCOPIC - Abnormal; Notable for the following components:   APPearance HAZY (*)    Glucose, UA >=500 (*)    Hgb urine dipstick SMALL (*)    Bilirubin Urine SMALL (*)    Ketones, ur 5 (*)    Protein, ur 100 (*)    Bacteria, UA RARE (*)    All other components within normal limits  CBG MONITORING, ED - Abnormal; Notable for the following components:   Glucose-Capillary 468 (*)    All other components within normal limits  TROPONIN I (HIGH SENSITIVITY) - Abnormal; Notable for the following components:   Troponin I (High Sensitivity) 99 (*)    All other components within normal limits  TROPONIN I (HIGH SENSITIVITY) - Abnormal; Notable for the following components:   Troponin I (High Sensitivity) 87 (*)    All other components within normal limits  RESP PANEL BY RT-PCR (FLU A&B, COVID) ARPGX2  LIPASE, BLOOD    US Abdomen Limited RUQ (LIVER/GB)  Final Result      Medications  ondansetron (ZOFRAN-ODT) disintegrating tablet 4 mg (has no administration in time range)  sodium chloride 0.9 % bolus 2,000 mL (has no administration in time range)  pantoprazole (PROTONIX) injection 40 mg (has no administration in time range)  morphine (PF) 4 MG/ML injection 4 mg (has no  administration in time range)     Procedures  /  Critical Care Procedures  ED Course and Medical Decision Making  Initial Impression and Ddx Chest pain, history of CAD, seems more GI in etiology given the epigastric discomfort, at times it is described as burning.  Also having a lot of vomiting.  EKG showing nonspecific findings, troponin is elevated up to the 90s, downtrending slightly on repeat.  Patient also found to be fairly significantly hyponatremic, still feeling unwell with poor p.o. tolerance, will request admission.  Past medical/surgical history that increases complexity of ED encounter: CAD  Interpretation of Diagnostics I personally reviewed the EKG and my interpretation is as follows: Sinus rhythm with nonspecific findings   Patient Reassessment and Ultimate Disposition/Management     Admission to hospital service.  Patient management required discussion with the following services or consulting groups:  None  Complexity of Problems Addressed Acute illness or injury that poses threat of life of bodily function  Additional Data Reviewed and Analyzed Further history obtained from: Prior labs/imaging results  Additional Factors Impacting ED Encounter Risk Consideration of hospitalization  Barth Kirks. [place] Small, [place] [email or web address]  Final Clinical Impressions(s) / ED Diagnoses     ICD-10-CM   1. Chest pain, unspecified type  R07.9     2. Hyponatremia  E87.1     3. Elevated troponin  R79.89       ED Discharge Orders     None        Discharge Instructions Discussed with and Provided to Patient:   Discharge Instructions   None      Maudie Flakes, MD 06/25/22 [phone]

## 2022-06-25 NOTE — ED Notes (Signed)
Unable to pull additional labs from IV at this time

## 2022-06-25 NOTE — Progress Notes (Signed)
ANTICOAGULATION CONSULT NOTE - Initial Consult  Pharmacy Consult for Warfarin Indication: atrial fibrillation, LV thrombus  Allergies  Allergen Reactions   Fish Allergy Swelling   Shellfish Allergy Anaphylaxis, Swelling and Other (See Comments)    Airway involvement including EMS - Has Epi-Pen   Metformin And Related Diarrhea   Trulicity [Dulaglutide] Nausea And Vomiting and Other (See Comments)    Multiple episodes of vomiting over multiple days   Nsaids Other (See Comments)    Per PCP interferes with daily meds (heart failure)   Advil [Ibuprofen] Other (See Comments)    Per PCP interferes with daily meds (heart failure)    Patient Measurements: Height: '4\' 11"'$  (149.9 cm) Weight: 85.7 kg (189 lb) IBW/kg (Calculated) : 43.2  Vital Signs: Temp: 97.7 F (36.5 C) (12/02 1529) Temp Source: Oral (12/02 1529) BP: 110/84 (12/02 1802) Pulse Rate: 84 (12/02 1802)  Labs: Recent Labs    06/24/22 1848 06/24/22 2025 06/25/22 0642  HGB 11.3*  --   --   HCT 36.4  --   --   PLT 301  --   --   CREATININE 1.09*  --  0.76  TROPONINIHS 99* 87*  --     Estimated Creatinine Clearance: 85.3 mL/min (by C-G formula based on SCr of 0.76 mg/dL).   Medical History: Past Medical History:  Diagnosis Date   Abnormal Pap smear    Acute heart failure ([place]) 01/29/2022   Acute on chronic combined systolic and diastolic CHF (congestive heart failure) ([place]) 02/19/2019   Acute on chronic congestive heart failure (HCC)    Acute on chronic congestive heart failure (HCC)    Acute on chronic diastolic CHF (congestive heart failure) ([place]) 10/19/2021   Acute on chronic systolic heart failure ([place]) 02/22/2022   Anemia    Asthma    Atrial fibrillation ([place])    history of paroxysmal atrial fibrillation   CHF (congestive heart failure) ([place]) 11/02/2021   CHF exacerbation ([place]) 10/17/2021   Diabetes mellitus    DKA, type 2 ([place]) 02/06/2019   Elevated troponin 09/30/2019   Heart failure with reduced ejection  fraction ([place])    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  Consider chronic etiology given continued fertility issues   Hypertension    Hypertensive emergency 09/30/2019   Major depressive disorder 02/08/2012   Reports that 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     Migraines    Morbid obesity ([place]) 02/06/2019   Nonischemic cardiomyopathy ([place]) 09/22/2021   NSTEMI (non-ST elevated myocardial infarction) ([place]) 02/23/2022   Obstructive sleep apnea 06/30/2015   Polycystic ovarian disease     Medications:  Scheduled:   [START ON 06/27/2022] atorvastatin  40 mg Oral Q M,W,F   carvedilol  3.125 mg Oral BID WC   furosemide  80 mg Oral q morning   insulin aspart  0-15 Units Subcutaneous TID WC   insulin aspart  0-5 Units Subcutaneous QHS   insulin detemir  30 Units Subcutaneous QHS   losartan  25 mg Oral Daily   ondansetron  4 mg Oral Once   pantoprazole  40 mg Oral Daily   senna-docusate  1 tablet Oral BID   spironolactone  12.5 mg Oral Daily   traZODone  50 mg Oral QHS   Infusions:  PRN: acetaminophen **OR** acetaminophen, labetalol, ondansetron (ZOFRAN) IV, oxyCODONE, polyethylene glycol  Assessment: 46  yo female on chronic warfarin for afib, LV thrombus presents to ED with chest pain, nausea and vomiting.  Pharmacy consulted to continue dosing warfarin while inpatient.  INR is therapeutic.  Home warfarin regimen reported as 11.'25mg'$  daily except 7.'5mg'$  on Mondays - last taken 11/29  Goal of Therapy:  INR 2-3 Monitor platelets by anticoagulation protocol: Yes   Plan:  Warfarin 11.'25mg'$  PO x 1 tonight Daily PT/INR  Peggyann Juba, PharmD, BCPS Pharmacy: [phone] 06/25/2022,6:58 PM

## 2022-06-25 NOTE — ED Notes (Signed)
Pt sleeping at present time. Resp even and nonlabored. Remains on monitor.

## 2022-06-25 NOTE — ED Notes (Signed)
Pt provided with lunch tray.

## 2022-06-26 DIAGNOSIS — R112 Nausea with vomiting, unspecified: Secondary | ICD-10-CM

## 2022-06-26 HISTORY — DX: Nausea with vomiting, unspecified: R11.2

## 2022-06-26 LAB — COMPREHENSIVE METABOLIC PANEL
ALT: 13 U/L (ref 0–44)
AST: 31 U/L (ref 15–41)
Albumin: 2.6 g/dL — ABNORMAL LOW (ref 3.5–5.0)
Alkaline Phosphatase: 144 U/L — ABNORMAL HIGH (ref 38–126)
Anion gap: 9 (ref 5–15)
BUN: 18 mg/dL (ref 6–20)
CO2: 22 mmol/L (ref 22–32)
Calcium: 8.5 mg/dL — ABNORMAL LOW (ref 8.9–10.3)
Chloride: 103 mmol/L (ref 98–111)
Creatinine, Ser: 1.16 mg/dL — ABNORMAL HIGH (ref 0.44–1.00)
GFR, Estimated: 60 mL/min — ABNORMAL LOW (ref >60)
Glucose, Bld: 195 mg/dL — ABNORMAL HIGH (ref 70–99)
Potassium: 3.6 mmol/L (ref 3.5–5.1)
Sodium: 134 mmol/L — ABNORMAL LOW (ref 135–145)
Total Bilirubin: 2.3 mg/dL — ABNORMAL HIGH (ref 0.3–1.2)
Total Protein: 6.9 g/dL (ref 6.5–8.1)

## 2022-06-26 LAB — CBC
HCT: 33.8 % — ABNORMAL LOW (ref 36.0–46.0)
Hemoglobin: 10.2 g/dL — ABNORMAL LOW (ref 12.0–15.0)
MCH: 20.8 pg — ABNORMAL LOW (ref 26.0–34.0)
MCHC: 30.2 g/dL (ref 30.0–36.0)
MCV: 69 fL — ABNORMAL LOW (ref 80.0–100.0)
Platelets: 252 10*3/uL (ref 150–400)
RBC: 4.9 MIL/uL (ref 3.87–5.11)
RDW: 20.7 % — ABNORMAL HIGH (ref 11.5–15.5)
WBC: 7.1 10*3/uL (ref 4.0–10.5)
nRBC: 0 % (ref 0.0–0.2)

## 2022-06-26 LAB — GLUCOSE, CAPILLARY
Glucose-Capillary: 222 mg/dL — ABNORMAL HIGH (ref 70–99)
Glucose-Capillary: 267 mg/dL — ABNORMAL HIGH (ref 70–99)
Glucose-Capillary: 177 mg/dL — ABNORMAL HIGH (ref 70–99)
Glucose-Capillary: 101 mg/dL — ABNORMAL HIGH (ref 70–99)

## 2022-06-26 LAB — PROTIME-INR
INR: 2.8 — ABNORMAL HIGH (ref 0.8–1.2)
Prothrombin Time: 29.4 seconds — ABNORMAL HIGH (ref 11.4–15.2)

## 2022-06-26 LAB — PREGNANCY, URINE: Preg Test, Ur: NEGATIVE

## 2022-06-26 LAB — SODIUM, URINE, RANDOM: Sodium, Ur: 75 mmol/L

## 2022-06-26 MED ORDER — METOCLOPRAMIDE HCL 5 MG/ML IJ SOLN
5.0000 mg | Freq: Three times a day (TID) | INTRAMUSCULAR | Status: DC
  Administered 2022-06-26 – 2022-06-28 (×6): 5 mg via INTRAVENOUS
  Filled 2022-06-26 (×6): qty 2

## 2022-06-26 MED ORDER — MORPHINE SULFATE (PF) 2 MG/ML IV SOLN
1.0000 mg | INTRAVENOUS | Status: DC | PRN
  Administered 2022-06-26 – 2022-06-30 (×13): 1 mg via INTRAVENOUS
  Filled 2022-06-26 (×14): qty 1

## 2022-06-26 MED ORDER — WARFARIN SODIUM 10 MG PO TABS
11.2500 mg | ORAL_TABLET | Freq: Once | ORAL | Status: AC
  Administered 2022-06-26: 11.25 mg via ORAL
  Filled 2022-06-26: qty 1

## 2022-06-26 MED ORDER — INSULIN ASPART 100 UNIT/ML IJ SOLN
3.0000 [IU] | Freq: Three times a day (TID) | INTRAMUSCULAR | Status: DC
  Administered 2022-06-26 (×2): 3 [IU] via SUBCUTANEOUS

## 2022-06-26 MED ORDER — INSULIN DETEMIR 100 UNIT/ML ~~LOC~~ SOLN
35.0000 [IU] | Freq: Every day | SUBCUTANEOUS | Status: DC
  Administered 2022-06-26: 35 [IU] via SUBCUTANEOUS
  Filled 2022-06-26 (×2): qty 0.35

## 2022-06-26 NOTE — TOC Initial Note (Signed)
Transition of Care [facility]) - Initial/Assessment Note    Patient Details  Name: [name] MRN: [id number] Date of Birth: [birth year]  Transition of [facility]) CM/SW Contact:    Henrietta Dine, RN Phone Number: 06/26/2022, 5:51 PM  Clinical Narrative:                  Transition of Care [facility]) Screening Note   Patient Details  Name: [name] Date of Birth: 18-Jul-1976   Transition of Care [facility]) CM/SW Contact:    Henrietta Dine, RN Phone Number: 06/26/2022, 5:51 PM    Transition of Care Department [facility]) has reviewed patient and no TOC needs have been identified at this time. We will continue to monitor patient advancement through interdisciplinary progression rounds. If new patient transition needs arise, please place a TOC consult.          Patient Goals and CMS Choice        Expected Discharge Plan and Services                                                Prior Living Arrangements/Services                       Activities of Daily Living Home Assistive Devices/Equipment: Eyeglasses ADL Screening (condition at time of admission) Patient's cognitive ability adequate to safely complete daily activities?: Yes Is the patient deaf or have difficulty hearing?: No Does the patient have difficulty seeing, even when wearing glasses/contacts?: No Does the patient have difficulty concentrating, remembering, or making decisions?: No Patient able to express need for assistance with ADLs?: Yes Does the patient have difficulty dressing or bathing?: No Independently performs ADLs?: Yes (appropriate for developmental age) Communication: Independent Dressing (OT): Independent Grooming: Independent Feeding: Independent Bathing: Independent Toileting: Independent In/Out Bed: Independent Is this a change from baseline?: Pre-admission baseline Walks in Home: Independent Is this a change from baseline?: Pre-admission baseline Does  the patient have difficulty walking or climbing stairs?: Yes Weakness of Legs: Both Weakness of Arms/Hands: Both  Permission Sought/Granted                  Emotional Assessment              Admission diagnosis:  Hyponatremia [E87.1] Elevated troponin [R79.89] Nausea and vomiting [R11.2] Chest pain, unspecified type [R07.9] Nausea & vomiting [R11.2] Patient Active Problem List   Diagnosis Date Noted   Nausea & vomiting 06/26/2022   Nausea and vomiting 06/25/2022   Mild protein malnutrition ([place]) 06/25/2022   Class 2 obesity 06/25/2022   Hyperbilirubinemia 06/25/2022   Hypertensive urgency 06/02/2022   Chronic pain 05/09/2022   Dizziness 11/02/2021   Depression, recurrent ([place])    Nonischemic cardiomyopathy ([place]) 09/22/2021   Nonobstructive atherosclerosis of coronary artery 09/22/2021   History of left ventricular mural thrombus 09/22/2021   Chronic chest wall pain 03/27/2019   Acute combined systolic and diastolic heart failure ([place]) 02/19/2019   Hyperglycemia    Biventricular failure ([place]) 02/06/2019   Morbid obesity ([place]) 02/06/2019   Chronic combined systolic and diastolic heart failure ([place]) 12/31/2018   Paroxysmal atrial fibrillation ([place]) 12/31/2018   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/31/2018   Chronic nausea 12/27/2018   Hyperlipidemia associated with type 2 diabetes mellitus ([place]) 10/30/2017   Obstructive sleep  apnea 06/30/2015   Shortness of breath 06/29/2015   Anemia of chronic disease 07/22/2013   Allergic rhinitis 06/27/2012   Hypertension associated with diabetes ([place]) 12/04/2011   Poorly controlled type 2 diabetes mellitus ([place]) 05/19/2011   PCOS (polycystic ovarian syndrome) 05/19/2011   PCP:  Donney Dice, DO Pharmacy:   [facility] [phone] [place], [place] - [place] [address] Phone: [phone] Fax: [phone]  [facility] [place], [place] - [place] [address] Phone: [phone] Fax: [place], [place] [address] Phone: [phone] Fax: [place] [address] [place] [address] Phone: [phone] Fax: [phone]     Social Determinants of Health (SDOH) Interventions    Readmission Risk Interventions     No data to display

## 2022-06-26 NOTE — Progress Notes (Signed)
PROGRESS NOTE    [name]  ZYS:[id number] DOB: 05/18/1976 DOA: 06/24/2022 PCP: Donney Dice, DO     Brief Narrative:  [name] is a 46 y.o. female with medical history significant of abnormal Pap smear, chronic combined systolic and diastolic heart failure, normocytic anemia, asthma, left ventricle thrombus, paroxysmal atrial fibrillation on warfarin, CAD, history NSTEMI, chronic elevated troponin, type 2 diabetes, history of episodes of DKA, hydrosalpinx, polycystic ovarian disease, hypertension, episodes of hypertensive emergency, depression, migraine headaches, obesity currently class II obesity with a BMI of 38.17 kg/m, obstructive sleep apnea not on CPAP who presented to the emergency department due to chest pain, nausea and vomiting for a month.  She was seen by GI (Dr. Candis Schatz) back in April with workup showing gastritis and no H. pylori.    New events last 24 hours / Subjective: She admits to continued nausea and vomiting over the past several weeks.  Also associated with generalized abdominal tenderness, and some central chest pain due to continued emesis.  Also admits to breast tenderness, wondering if she is pregnant.  LMP dated 9/13. She already received her morning medications this morning.  Asked pharmacy and nursing to hold further medications until urine pregnancy test results.  Assessment & Plan:   Principal Problem:   Nausea and vomiting Active Problems:   Poorly controlled type 2 diabetes mellitus (HCC)   Anemia of chronic disease   Obstructive sleep apnea   Hyperlipidemia associated with type 2 diabetes mellitus (HCC)   Chronic combined systolic and diastolic heart failure (HCC)   Paroxysmal atrial fibrillation (HCC)   Chronic chest wall pain   History of left ventricular mural thrombus   Hypertensive urgency   Mild protein malnutrition (HCC)   Class 2 obesity   Hyperbilirubinemia   Nausea & vomiting   Nausea, vomiting -Holding additional IVF  due to hx CHF -Date LMP 04/06/2022. She also admits to being sexually active and has breast tenderness -Check urine pregnancy test today -Antiemetics PRN   Diabetes mellitus type 2, poorly controlled with hyperglycemia -A1c > 15.5  -Levemir, novolog, sliding scale insulin -Diabetes coordinator consulted  Hyperlipidemia -Can resume Lipitor  Chronic combined systolic diastolic heart failure -EF <93%, grade 3 diastolic dysfunction -Lasix, coreg, cozaar, spironolactone   Demand ischemia -Troponin 99, 87  History left ventricular mural thrombus -Coumadin  OSA -CPAP nightly   DVT prophylaxis: Coumadin   Code Status: Full Family Communication: None at bedside  Disposition Plan:  Status is: Observation  No medical necessity for IP status. However, patient is still nauseous with abdominal discomfort and does not feel ready to discharge home yet   Antimicrobials:  Anti-infectives (From admission, onward)    None        Objective: Vitals:   06/25/22 1802 06/25/22 1908 06/25/22 2326 06/26/22 0355  BP: 110/84 (!) 148/112 (!) 120/90 (!) 122/93  Pulse: 84 84 87 90  Resp: '19 19 17 17  '$ Temp:  97.8 F (36.6 C) 97.8 F (36.6 C) 97.6 F (36.4 C)  TempSrc:  Oral Oral Oral  SpO2: 100% 97% 100% 100%  Weight:      Height:        Intake/Output Summary (Last 24 hours) at 06/26/2022 1034 Last data filed at 06/25/2022 2300 Gross per 24 hour  Intake 265.07 ml  Output 400 ml  Net -134.93 ml   Filed Weights   06/24/22 1856  Weight: 85.7 kg    Examination:  General exam: Appears calm and comfortable  Respiratory  system: Clear to auscultation. Respiratory effort normal. No respiratory distress. No conversational dyspnea.  Cardiovascular system: S1 & S2 heard, RRR. No murmurs. No pedal edema. Gastrointestinal system: Abdomen is nondistended, soft and TTP periumbilical. Normal bowel sounds heard. Central nervous system: Alert and oriented. No focal neurological deficits.  Speech clear.  Extremities: Symmetric in appearance  Skin: No rashes, lesions or ulcers on exposed skin  Psychiatry: Judgement and insight appear normal. Mood & affect appropriate.   Data Reviewed: I have personally reviewed following labs and imaging studies  CBC: Recent Labs  Lab 06/24/22 1848 06/26/22 0537  WBC 8.5 7.1  NEUTROABS 5.2  --   HGB 11.3* 10.2*  HCT 36.4 33.8*  MCV 69.2* 69.0*  PLT 301 619   Basic Metabolic Panel: Recent Labs  Lab 06/24/22 1848 06/25/22 0642 06/26/22 0537  NA 124* 133* 134*  K 3.6 4.1 3.6  CL 91* 100 103  CO2 20* 22 22  GLUCOSE 490* 350* 195*  BUN '14 13 18  '$ CREATININE 1.09* 0.76 1.16*  CALCIUM 8.4* 8.4* 8.5*   GFR: Estimated Creatinine Clearance: 58.8 mL/min (A) (by C-G formula based on SCr of 1.16 mg/dL (H)). Liver Function Tests: Recent Labs  Lab 06/24/22 1848 06/26/22 0537  AST 23 31  ALT 14 13  ALKPHOS 151* 144*  BILITOT 2.8* 2.3*  PROT 8.1 6.9  ALBUMIN 3.1* 2.6*   Recent Labs  Lab 06/24/22 1848  LIPASE 24   No results for input(s): "AMMONIA" in the last 168 hours. Coagulation Profile: Recent Labs  Lab 06/25/22 1918 06/26/22 0537  INR 2.5* 2.8*   Cardiac Enzymes: No results for input(s): "CKTOTAL", "CKMB", "CKMBINDEX", "TROPONINI" in the last 168 hours. BNP (last 3 results) No results for input(s): "PROBNP" in the last 8760 hours. HbA1C: No results for input(s): "HGBA1C" in the last 72 hours. CBG: Recent Labs  Lab 06/25/22 0839 06/25/22 1217 06/25/22 1657 06/25/22 2217 06/26/22 0738  GLUCAP 335* 269* 196* 232* 222*   Lipid Profile: No results for input(s): "CHOL", "HDL", "LDLCALC", "TRIG", "CHOLHDL", "LDLDIRECT" in the last 72 hours. Thyroid Function Tests: Recent Labs    06/25/22 0642  TSH 1.887   Anemia Panel: No results for input(s): "VITAMINB12", "FOLATE", "FERRITIN", "TIBC", "IRON", "RETICCTPCT" in the last 72 hours. Sepsis Labs: No results for input(s): "PROCALCITON", "LATICACIDVEN" in the  last 168 hours.  Recent Results (from the past 240 hour(s))  Resp Panel by RT-PCR (Flu A&B, Covid) Anterior Nasal Swab     Status: None   Collection Time: 06/24/22  6:33 PM   Specimen: Anterior Nasal Swab  Result Value Ref Range Status   SARS Coronavirus 2 by RT PCR NEGATIVE NEGATIVE Final    Comment: (NOTE) SARS-CoV-2 target nucleic acids are NOT DETECTED.  The SARS-CoV-2 RNA is generally detectable in upper respiratory specimens during the acute phase of infection. The lowest concentration of SARS-CoV-2 viral copies this assay can detect is 138 copies/mL. A negative result does not preclude SARS-Cov-2 infection and should not be used as the sole basis for treatment or other patient management decisions. A negative result may occur with  improper specimen collection/handling, submission of specimen other than nasopharyngeal swab, presence of viral mutation(s) within the areas targeted by this assay, and inadequate number of viral copies(<138 copies/mL). A negative result must be combined with clinical observations, patient history, and epidemiological information. The expected result is Negative.  Fact Sheet for Patients:  EntrepreneurPulse.com.au  Fact Sheet for Healthcare Providers:  IncredibleEmployment.be  This test is no t  yet approved or cleared by the [place] and  has been authorized for detection and/or diagnosis of SARS-CoV-2 by FDA under an Emergency Use Authorization (EUA). This EUA will remain  in effect (meaning this test can be used) for the duration of the COVID-19 declaration under Section 564(b)(1) of the Act, 21 U.S.C.section 360bbb-3(b)(1), unless the authorization is terminated  or revoked sooner.       Influenza A by PCR NEGATIVE NEGATIVE Final   Influenza B by PCR NEGATIVE NEGATIVE Final    Comment: (NOTE) The Xpert Xpress SARS-CoV-2/FLU/RSV plus assay is intended as an aid in the diagnosis of influenza  from Nasopharyngeal swab specimens and should not be used as a sole basis for treatment. Nasal washings and aspirates are unacceptable for Xpert Xpress SARS-CoV-2/FLU/RSV testing.  Fact Sheet for Patients: EntrepreneurPulse.com.au  Fact Sheet for Healthcare Providers: IncredibleEmployment.be  This test is not yet approved or cleared by the [place] FDA and has been authorized for detection and/or diagnosis of SARS-CoV-2 by FDA under an Emergency Use Authorization (EUA). This EUA will remain in effect (meaning this test can be used) for the duration of the COVID-19 declaration under Section 564(b)(1) of the Act, 21 U.S.C. section 360bbb-3(b)(1), unless the authorization is terminated or revoked.  Performed at [facility], [place] [address], [place], [place] [address]       Radiology Studies: US Abdomen Limited RUQ (LIVER/GB)  Result Date: 06/24/2022 CLINICAL DATA:  Right upper quadrant pain. EXAM: ULTRASOUND ABDOMEN LIMITED RIGHT UPPER QUADRANT COMPARISON:  November 04, 2021 FINDINGS: Gallbladder: No gallstones are visualized. The gallbladder wall measures 3.6 mm in thickness. No sonographic Murphy sign noted by sonographer. Common bile duct: Diameter: 3.4 mm Liver: No focal lesion identified. Within normal limits in parenchymal echogenicity. Portal vein is patent on color Doppler imaging with normal direction of blood flow towards the liver. Other: None. IMPRESSION: Unremarkable right upper quadrant ultrasound. Electronically Signed   By: Virgina Norfolk M.D.   On: 06/24/2022 23:16      Scheduled Meds:  carvedilol  3.125 mg Oral BID WC   furosemide  80 mg Oral q morning   insulin aspart  0-15 Units Subcutaneous TID WC   insulin aspart  0-5 Units Subcutaneous QHS   insulin aspart  3 Units Subcutaneous TID WC   insulin detemir  30 Units Subcutaneous QHS   losartan  25 mg Oral Daily   pantoprazole  40 mg Oral Daily    senna-docusate  1 tablet Oral BID   spironolactone  12.5 mg Oral Daily   traZODone  50 mg Oral QHS   Warfarin - Pharmacist Dosing Inpatient   Does not apply q1600   Continuous Infusions:   LOS: 1 day     Dessa Phi, DO [facility] 06/26/2022, 10:34 AM   Available via Epic secure chat 7am-7pm After these hours, please refer to coverage provider listed on [email or web address]

## 2022-06-26 NOTE — Care Management CC44 (Signed)
Condition Code 44 Documentation Completed  Patient Details  Name: SHAWNDELL VARAS MRN: [id number] Date of Birth: 13-Jun-1976   Condition Code 44 given:  Yes Patient signature on Condition Code 44 notice:  Yes Documentation of 2 MD's agreement:  Yes Code 44 added to claim:  Yes    Henrietta Dine, RN 06/26/2022, 12:05 PM

## 2022-06-26 NOTE — Inpatient Diabetes Management (Signed)
Inpatient Diabetes Program Recommendations  AACE/ADA: New Consensus Statement on Inpatient Glycemic Control (2015)  Target Ranges:  Prepandial:   less than 140 mg/dL      Peak postprandial:   less than 180 mg/dL (1-2 hours)      Critically ill patients:  140 - 180 mg/dL    Latest Reference Range & Units 11/02/21 09:00 01/29/22 06:54 06/02/22 21:36  Hemoglobin A1C 4.8 - 5.6 % 9.0 (H) 12.1 (H) >15.5 (H)  >398 mg/dl  (H): Data is abnormally high  Latest Reference Range & Units 06/24/22 18:46 06/25/22 05:04 06/25/22 08:39 06/25/22 12:17 06/25/22 16:57 06/25/22 22:17 06/26/22 07:38  Glucose-Capillary 70 - 99 mg/dL 468 (H) 362 (H)  30 units Levemir 335 (H)  11 units Novolog  269 (H)  8 units Novolog  196 (H)  3 units Novolog  232 (H)  2 units Novolog  222 (H)  5 units Novolog   (H): Data is abnormally high   Admit with:  Nausea and vomiting  Diabetes mellitus type 2, poorly controlled with hyperglycemia   History: DM, CHF  Home DM Meds: Jardiance 10 mg daily        Novolog 12 units QPM with Dinner        Lantus 36 units Daily  Current Orders: Levemir 30 units QHS      Novolog Moderate Correction Scale/ SSI (0-15 units) TID AC + HS      Novolog 3 units TID with meals   Note Novolog Meal Coverage starting today at 12pm w/ Lunch  AM CBG still >200 today   MD- Pt last received Levemir yesterday at 5am.  Not due for Levemir until tonight at bedtime.  CBG 222 this AM.  Please consider Increasing the Levemir to 35 units and Give this AM (change timing to daily in the AM)   --Will follow patient during hospitalization--  Wyn Quaker RN, MSN, [place] Diabetes Coordinator Inpatient Glycemic Control Team Team Pager: [phone] (8a-5p)

## 2022-06-26 NOTE — Progress Notes (Signed)
Pt refused cpap. She doesn't use one at home.

## 2022-06-26 NOTE — Care Management Obs Status (Signed)
[facility] NOTIFICATION   Patient Details  Name: [name] MRN: [id number] Date of Birth: 1976-03-05   Medicare Observation Status Notification Given:  Yes    Henrietta Dine, RN 06/26/2022, 12:07 PM

## 2022-06-26 NOTE — Progress Notes (Signed)
ANTICOAGULATION CONSULT NOTE - Initial Consult  Pharmacy Consult for Warfarin Indication: atrial fibrillation, LV thrombus  Allergies  Allergen Reactions   Fish Allergy Swelling   Shellfish Allergy Anaphylaxis, Swelling and Other (See Comments)    Airway involvement including EMS - Has Epi-Pen   Metformin And Related Diarrhea   Trulicity [Dulaglutide] Nausea And Vomiting and Other (See Comments)    Multiple episodes of vomiting over multiple days   Nsaids Other (See Comments)    Per PCP interferes with daily meds (heart failure)   Advil [Ibuprofen] Other (See Comments)    Per PCP interferes with daily meds (heart failure)    Patient Measurements: Height: '4\' 11"'$  (149.9 cm) Weight: 85.7 kg (189 lb) IBW/kg (Calculated) : 43.2  Vital Signs: Temp: 97.6 F (36.4 C) (12/03 0355) Temp Source: Oral (12/03 0355) BP: 122/93 (12/03 0355) Pulse Rate: 90 (12/03 0355)  Labs: Recent Labs    06/24/22 1848 06/24/22 2025 06/25/22 0642 06/25/22 1918 06/26/22 0537  HGB 11.3*  --   --   --  10.2*  HCT 36.4  --   --   --  33.8*  PLT 301  --   --   --  252  LABPROT  --   --   --  26.9* 29.4*  INR  --   --   --  2.5* 2.8*  CREATININE 1.09*  --  0.76  --  1.16*  TROPONINIHS 99* 87*  --   --   --      Estimated Creatinine Clearance: 58.8 mL/min (A) (by C-G formula based on SCr of 1.16 mg/dL (H)).   Medical History: Past Medical History:  Diagnosis Date   Abnormal Pap smear    Acute heart failure ([facility]) 01/29/2022   Acute on chronic combined systolic and diastolic CHF (congestive heart failure) ([place]) 02/19/2019   Acute on chronic congestive heart failure (HCC)    Acute on chronic congestive heart failure (HCC)    Acute on chronic diastolic CHF (congestive heart failure) ([facility]) 10/19/2021   Acute on chronic systolic heart failure ([facility]) 02/22/2022   Anemia    Asthma    Atrial fibrillation ([place])    history of paroxysmal atrial fibrillation   CHF (congestive heart failure) ([place]) 11/02/2021    CHF exacerbation ([place]) 10/17/2021   Diabetes mellitus    DKA, type 2 ([place]) 02/06/2019   Elevated troponin 09/30/2019   Heart failure with reduced ejection fraction ([place])    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  Consider chronic etiology given continued fertility issues   Hypertension    Hypertensive emergency 09/30/2019   Major depressive disorder 02/08/2012   Reports that 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     Migraines    Morbid obesity ([facility]) 02/06/2019   Nonischemic cardiomyopathy ([place]) 09/22/2021   NSTEMI (non-ST elevated myocardial infarction) ([place]) 02/23/2022   Obstructive sleep apnea 06/30/2015   Polycystic ovarian disease     Medications:  Scheduled:   carvedilol  3.125 mg Oral BID WC   furosemide  80 mg Oral q morning   insulin aspart  0-15 Units Subcutaneous TID WC   insulin aspart  0-5 Units Subcutaneous QHS   insulin aspart  3 Units Subcutaneous TID WC   insulin detemir  35 Units Subcutaneous Daily   losartan  25 mg Oral Daily   metoCLOPramide (REGLAN) injection  5 mg Intravenous Q8H   pantoprazole  40 mg Oral Daily   senna-docusate  1 tablet Oral BID   spironolactone  12.5 mg Oral Daily   traZODone  50 mg Oral QHS   Warfarin - Pharmacist Dosing Inpatient   Does not apply q1600   Infusions:  PRN: acetaminophen **OR** acetaminophen, labetalol, morphine injection, ondansetron (ZOFRAN) IV, polyethylene glycol  Assessment: 46 yo female on chronic warfarin for afib, LV thrombus presents to ED with chest pain, nausea and vomiting.  Pharmacy consulted to continue dosing warfarin while inpatient.  INR is therapeutic.  Home warfarin regimen reported as 11.'25mg'$  daily except 7.'5mg'$  on Mondays - last taken 11/29  Today, 06/26/22 INR therapeutic at 2.8, yesterdays warfarin dose 11.25 mg  Goal of Therapy:  INR 2-3 Monitor platelets by  anticoagulation protocol: Yes   Plan:  Repeat Warfarin 11.'25mg'$  PO x 1 today Daily PT/INR  Thank you for allowing pharmacy to be a part of this patient's care.  Royetta Asal, PharmD, BCPS Clinical Pharmacist [facility] Please utilize Amion for appropriate phone number to reach the unit pharmacist ([place]) 06/26/2022 1:03 PM

## 2022-06-27 DIAGNOSIS — Z886 Allergy status to analgesic agent status: Secondary | ICD-10-CM | POA: Diagnosis not present

## 2022-06-27 DIAGNOSIS — I110 Hypertensive heart disease with heart failure: Secondary | ICD-10-CM | POA: Diagnosis present

## 2022-06-27 DIAGNOSIS — G4733 Obstructive sleep apnea (adult) (pediatric): Secondary | ICD-10-CM | POA: Diagnosis present

## 2022-06-27 DIAGNOSIS — E785 Hyperlipidemia, unspecified: Secondary | ICD-10-CM | POA: Diagnosis present

## 2022-06-27 DIAGNOSIS — I252 Old myocardial infarction: Secondary | ICD-10-CM | POA: Diagnosis not present

## 2022-06-27 DIAGNOSIS — E1143 Type 2 diabetes mellitus with diabetic autonomic (poly)neuropathy: Secondary | ICD-10-CM | POA: Diagnosis present

## 2022-06-27 DIAGNOSIS — Z794 Long term (current) use of insulin: Secondary | ICD-10-CM | POA: Diagnosis not present

## 2022-06-27 DIAGNOSIS — R112 Nausea with vomiting, unspecified: Secondary | ICD-10-CM | POA: Diagnosis present

## 2022-06-27 DIAGNOSIS — I480 Paroxysmal atrial fibrillation: Secondary | ICD-10-CM | POA: Diagnosis present

## 2022-06-27 DIAGNOSIS — I2489 Other forms of acute ischemic heart disease: Secondary | ICD-10-CM | POA: Diagnosis present

## 2022-06-27 DIAGNOSIS — E1165 Type 2 diabetes mellitus with hyperglycemia: Secondary | ICD-10-CM | POA: Diagnosis present

## 2022-06-27 DIAGNOSIS — I10 Essential (primary) hypertension: Secondary | ICD-10-CM | POA: Diagnosis not present

## 2022-06-27 DIAGNOSIS — D638 Anemia in other chronic diseases classified elsewhere: Secondary | ICD-10-CM | POA: Diagnosis present

## 2022-06-27 DIAGNOSIS — Z79899 Other long term (current) drug therapy: Secondary | ICD-10-CM | POA: Diagnosis not present

## 2022-06-27 DIAGNOSIS — E441 Mild protein-calorie malnutrition: Secondary | ICD-10-CM | POA: Diagnosis present

## 2022-06-27 DIAGNOSIS — I160 Hypertensive urgency: Secondary | ICD-10-CM | POA: Diagnosis present

## 2022-06-27 DIAGNOSIS — E11649 Type 2 diabetes mellitus with hypoglycemia without coma: Secondary | ICD-10-CM | POA: Diagnosis present

## 2022-06-27 DIAGNOSIS — E1169 Type 2 diabetes mellitus with other specified complication: Secondary | ICD-10-CM | POA: Diagnosis present

## 2022-06-27 DIAGNOSIS — Z91013 Allergy to seafood: Secondary | ICD-10-CM | POA: Diagnosis not present

## 2022-06-27 DIAGNOSIS — I5043 Acute on chronic combined systolic (congestive) and diastolic (congestive) heart failure: Secondary | ICD-10-CM | POA: Diagnosis not present

## 2022-06-27 DIAGNOSIS — I2510 Atherosclerotic heart disease of native coronary artery without angina pectoris: Secondary | ICD-10-CM | POA: Diagnosis not present

## 2022-06-27 DIAGNOSIS — E7800 Pure hypercholesterolemia, unspecified: Secondary | ICD-10-CM | POA: Diagnosis not present

## 2022-06-27 DIAGNOSIS — E871 Hypo-osmolality and hyponatremia: Secondary | ICD-10-CM | POA: Diagnosis present

## 2022-06-27 DIAGNOSIS — R17 Unspecified jaundice: Secondary | ICD-10-CM | POA: Diagnosis present

## 2022-06-27 DIAGNOSIS — I513 Intracardiac thrombosis, not elsewhere classified: Secondary | ICD-10-CM | POA: Diagnosis not present

## 2022-06-27 DIAGNOSIS — Z1152 Encounter for screening for COVID-19: Secondary | ICD-10-CM | POA: Diagnosis not present

## 2022-06-27 DIAGNOSIS — R7989 Other specified abnormal findings of blood chemistry: Secondary | ICD-10-CM | POA: Diagnosis not present

## 2022-06-27 DIAGNOSIS — Z888 Allergy status to other drugs, medicaments and biological substances status: Secondary | ICD-10-CM | POA: Diagnosis not present

## 2022-06-27 DIAGNOSIS — Z86718 Personal history of other venous thrombosis and embolism: Secondary | ICD-10-CM | POA: Diagnosis not present

## 2022-06-27 LAB — GLUCOSE, CAPILLARY
Glucose-Capillary: 67 mg/dL — ABNORMAL LOW (ref 70–99)
Glucose-Capillary: 139 mg/dL — ABNORMAL HIGH (ref 70–99)
Glucose-Capillary: 70 mg/dL (ref 70–99)
Glucose-Capillary: 184 mg/dL — ABNORMAL HIGH (ref 70–99)
Glucose-Capillary: 168 mg/dL — ABNORMAL HIGH (ref 70–99)
Glucose-Capillary: 69 mg/dL — ABNORMAL LOW (ref 70–99)
Glucose-Capillary: 134 mg/dL — ABNORMAL HIGH (ref 70–99)
Glucose-Capillary: 74 mg/dL (ref 70–99)

## 2022-06-27 LAB — COMPREHENSIVE METABOLIC PANEL
ALT: 16 U/L (ref 0–44)
AST: 55 U/L — ABNORMAL HIGH (ref 15–41)
Albumin: 2.7 g/dL — ABNORMAL LOW (ref 3.5–5.0)
Alkaline Phosphatase: 165 U/L — ABNORMAL HIGH (ref 38–126)
Anion gap: 11 (ref 5–15)
BUN: 22 mg/dL — ABNORMAL HIGH (ref 6–20)
CO2: 20 mmol/L — ABNORMAL LOW (ref 22–32)
Calcium: 8.4 mg/dL — ABNORMAL LOW (ref 8.9–10.3)
Chloride: 102 mmol/L (ref 98–111)
Creatinine, Ser: 1.22 mg/dL — ABNORMAL HIGH (ref 0.44–1.00)
GFR, Estimated: 56 mL/min — ABNORMAL LOW (ref >60)
Glucose, Bld: 64 mg/dL — ABNORMAL LOW (ref 70–99)
Potassium: 4.2 mmol/L (ref 3.5–5.1)
Sodium: 133 mmol/L — ABNORMAL LOW (ref 135–145)
Total Bilirubin: 1.8 mg/dL — ABNORMAL HIGH (ref 0.3–1.2)
Total Protein: 7.2 g/dL (ref 6.5–8.1)

## 2022-06-27 LAB — PROTIME-INR
INR: 3.5 — ABNORMAL HIGH (ref 0.8–1.2)
Prothrombin Time: 34.9 seconds — ABNORMAL HIGH (ref 11.4–15.2)

## 2022-06-27 MED ORDER — SERTRALINE HCL 50 MG PO TABS
50.0000 mg | ORAL_TABLET | Freq: Every day | ORAL | Status: DC
  Administered 2022-06-27 – 2022-07-01 (×5): 50 mg via ORAL
  Filled 2022-06-27 (×5): qty 1

## 2022-06-27 MED ORDER — WARFARIN SODIUM 2.5 MG PO TABS
2.5000 mg | ORAL_TABLET | Freq: Once | ORAL | Status: AC
  Administered 2022-06-27: 2.5 mg via ORAL
  Filled 2022-06-27: qty 1

## 2022-06-27 MED ORDER — INSULIN DETEMIR 100 UNIT/ML ~~LOC~~ SOLN
25.0000 [IU] | Freq: Every day | SUBCUTANEOUS | Status: DC
  Administered 2022-06-27: 25 [IU] via SUBCUTANEOUS
  Filled 2022-06-27 (×2): qty 0.25

## 2022-06-27 MED ORDER — ATORVASTATIN CALCIUM 40 MG PO TABS
40.0000 mg | ORAL_TABLET | Freq: Every day | ORAL | Status: DC
  Administered 2022-06-27 – 2022-07-01 (×5): 40 mg via ORAL
  Filled 2022-06-27 (×5): qty 1

## 2022-06-27 NOTE — Progress Notes (Signed)
[facility] for Warfarin Indication: atrial fibrillation, LV thrombus  Allergies  Allergen Reactions   Fish Allergy Swelling   Shellfish Allergy Anaphylaxis, Swelling and Other (See Comments)    Airway involvement including EMS - Has Epi-Pen   Metformin And Related Diarrhea   Trulicity [Dulaglutide] Nausea And Vomiting and Other (See Comments)    Multiple episodes of vomiting over multiple days   Nsaids Other (See Comments)    Per PCP interferes with daily meds (heart failure)   Advil [Ibuprofen] Other (See Comments)    Per PCP interferes with daily meds (heart failure)    Patient Measurements: Height: '4\' 11"'$  (149.9 cm) Weight: 85.7 kg (189 lb) IBW/kg (Calculated) : 43.2  Vital Signs: Temp: 97.6 F (36.4 C) (12/04 0507) Temp Source: Oral (12/04 0507) BP: 123/93 (12/04 0507) Pulse Rate: 94 (12/04 0507)  Labs: Recent Labs    06/24/22 1848 06/24/22 2025 06/25/22 0642 06/25/22 1918 06/26/22 0537 06/27/22 0425  HGB 11.3*  --   --   --  10.2*  --   HCT 36.4  --   --   --  33.8*  --   PLT 301  --   --   --  252  --   LABPROT  --   --   --  26.9* 29.4* 34.9*  INR  --   --   --  2.5* 2.8* 3.5*  CREATININE 1.09*  --  0.76  --  1.16* 1.22*  TROPONINIHS 99* 87*  --   --   --   --      Estimated Creatinine Clearance: 55.9 mL/min (A) (by C-G formula based on SCr of 1.22 mg/dL (H)).  Medications:  Scheduled:   atorvastatin  40 mg Oral Daily   carvedilol  3.125 mg Oral BID WC   furosemide  80 mg Oral q morning   insulin aspart  0-15 Units Subcutaneous TID WC   insulin aspart  0-5 Units Subcutaneous QHS   insulin detemir  25 Units Subcutaneous Daily   losartan  25 mg Oral Daily   metoCLOPramide (REGLAN) injection  5 mg Intravenous Q8H   pantoprazole  40 mg Oral Daily   senna-docusate  1 tablet Oral BID   sertraline  50 mg Oral Daily   spironolactone  12.5 mg Oral Daily   traZODone  50 mg Oral QHS   Warfarin - Pharmacist Dosing  Inpatient   Does not apply q1600   Infusions:  PRN: acetaminophen **OR** acetaminophen, labetalol, morphine injection, ondansetron (ZOFRAN) IV, polyethylene glycol  Assessment: 46 yo female on chronic warfarin for afib, LV thrombus presents to ED with chest pain, nausea and vomiting. Pharmacy consulted to continue dosing warfarin while inpatient.  Home warfarin regimen reported as 11.'25mg'$  daily except 7.'5mg'$  on Mondays - last taken 11/29  Baseline INR therapeutic Prior anticoagulation: warfarin 11.25 mg daily except 7.5 mg on Monday; LD 11/29  Significant events:  Today, 06/27/2022: CBC: Hgb mildly decreased from admission; Plt stable WNL INR Supratherapeutic Major drug interactions: none noted No bleeding issues per nursing Eating 100% of meals  Goal of Therapy: INR 2-3  Plan: Warfarin 2.5 mg PO tonight at 16:00 Daily INR CBC at least q72 hr while on warfarin Monitor for signs of bleeding or thrombosis For discharge, would give 7.5 mg tomorrow (12/5), then resume home dosing with INR recheck in 3-5 days   Reuel Boom, PharmD, BCPS [phone] 06/27/2022, 12:11 PM

## 2022-06-27 NOTE — Inpatient Diabetes Management (Signed)
Inpatient Diabetes Program Recommendations  AACE/ADA: New Consensus Statement on Inpatient Glycemic Control (2015)  Target Ranges:  Prepandial:   less than 140 mg/dL      Peak postprandial:   less than 180 mg/dL (1-2 hours)      Critically ill patients:  140 - 180 mg/dL   Lab Results  Component Value Date   GLUCAP 168 (H) 06/27/2022   HGBA1C >15.5 (H) 06/02/2022    Review of Glycemic Control  Diabetes history: DM2 Outpatient Diabetes medications: Lantus 36 units QAM, Novolog 12 units ac supper, Jardiance 10 mg QD Current orders for Inpatient glycemic control: Levemir 25 QD, Novolog 0-15 TID with meals and 0-5 HS  Hypoglycemia this am.  CBGs 70, 184, 168 mg/dL HgbA1C - > 15.5%  Inpatient Diabetes Program Recommendations:    Agree with orders.  Pt states she didn't take her insulin when she was nauseated recently. Discussed HgbA1C of > 15.5%. Discussed need for weight loss and lifestyle changes to control blood sugars and reduce HgbA1C. Pt got out of bed, went to bathroom and closed door during out conversation. Will check back in with her in am.   Thank you. Lorenda Peck, RD, LDN, [place] Inpatient Diabetes Coordinator [phone]

## 2022-06-27 NOTE — Progress Notes (Signed)
Checked CBG pt c/o being diaphoretic. CBG 67. Pt given orange juice. CBG re-check 139. Hospitalist notified.

## 2022-06-27 NOTE — Progress Notes (Signed)
PROGRESS NOTE    [name]  LZJ:[id number] DOB: 12-05-1975 DOA: 06/24/2022 PCP: Donney Dice, DO     Brief Narrative:  [name] is a 46 y.o. female with medical history significant of abnormal Pap smear, chronic combined systolic and diastolic heart failure, normocytic anemia, asthma, left ventricle thrombus, paroxysmal atrial fibrillation on warfarin, CAD, history NSTEMI, chronic elevated troponin, type 2 diabetes, history of episodes of DKA, hydrosalpinx, polycystic ovarian disease, hypertension, episodes of hypertensive emergency, depression, migraine headaches, obesity currently class II obesity with a BMI of 38.17 kg/m, obstructive sleep apnea not on CPAP who presented to the emergency department due to chest pain, nausea and vomiting for a month.  She was seen by GI (Dr. Candis Schatz) back in April with workup showing gastritis and no H. pylori.    New events last 24 hours / Subjective: Patient continues to have some nausea and abdominal discomfort.  Ate some breakfast this morning.  Also with some hypoglycemic episodes.  Assessment & Plan:   Principal Problem:   Nausea and vomiting Active Problems:   Poorly controlled type 2 diabetes mellitus (HCC)   Anemia of chronic disease   Obstructive sleep apnea   Hyperlipidemia associated with type 2 diabetes mellitus (HCC)   Chronic combined systolic and diastolic heart failure (HCC)   Paroxysmal atrial fibrillation (HCC)   Chronic chest wall pain   History of left ventricular mural thrombus   Hypertensive urgency   Mild protein malnutrition (HCC)   Class 2 obesity   Hyperbilirubinemia   Nausea & vomiting   Nausea, vomiting -RUQ [place]: Unremarkable right upper quadrant ultrasound -Holding IVF due to hx CHF -Antiemetics PRN, Added Reglan  Diabetes mellitus type 2, poorly controlled with hyperglycemia and hypoglycemia -A1c > 15.5  -Levemir, sliding scale insulin -Diabetes coordinator  consulted  Hyperlipidemia -Lipitor  Chronic combined systolic diastolic heart failure -EF <02%, grade 3 diastolic dysfunction -Lasix, coreg, cozaar, spironolactone  -Strict I's and O's  Demand ischemia -Troponin 99, 87  History left ventricular mural thrombus -Coumadin  OSA -CPAP nightly   DVT prophylaxis: Coumadin  warfarin (COUMADIN) tablet 2.5 mg  Code Status: Full Family Communication: None at bedside  Disposition Plan:  Status is: Inpatient Remains inpatient appropriate because: Remains with symptoms, nausea and vomiting.  Some edema today     Antimicrobials:  Anti-infectives (From admission, onward)    None        Objective: Vitals:   06/26/22 0355 06/26/22 2132 06/27/22 0507 06/27/22 1314  BP: (!) 122/93 (!) 122/94 (!) 123/93 (!) 130/96  Pulse: 90 92 94 97  Resp: '17 17 17 18  '$ Temp: 97.6 F (36.4 C) 98.4 F (36.9 C) 97.6 F (36.4 C) 97.6 F (36.4 C)  TempSrc: Oral Oral Oral Oral  SpO2: 100% 100% 100% 98%  Weight:      Height:        Intake/Output Summary (Last 24 hours) at 06/27/2022 1342 Last data filed at 06/27/2022 0956 Gross per 24 hour  Intake 240 ml  Output --  Net 240 ml    Filed Weights   06/24/22 1856  Weight: 85.7 kg    Examination:  General exam: Appears calm  Respiratory system: Clear to auscultation. Respiratory effort normal. No respiratory distress. No conversational dyspnea.  Cardiovascular system: S1 & S2 heard, RRR. No murmurs. +1 pedal edema. Gastrointestinal system: Abdomen is nondistended, soft and TTP periumbilical. Normal bowel sounds heard. Central nervous system: Alert and oriented. No focal neurological deficits. Speech clear.  Extremities: Symmetric  in appearance  Skin: No rashes, lesions or ulcers on exposed skin  Psychiatry: Judgement and insight appear normal. Mood & affect appropriate.   Data Reviewed: I have personally reviewed following labs and imaging studies  CBC: Recent Labs  Lab  06/24/22 1848 06/26/22 0537  WBC 8.5 7.1  NEUTROABS 5.2  --   HGB 11.3* 10.2*  HCT 36.4 33.8*  MCV 69.2* 69.0*  PLT 301 619    Basic Metabolic Panel: Recent Labs  Lab 06/24/22 1848 06/25/22 0642 06/26/22 0537 06/27/22 0425  NA 124* 133* 134* 133*  K 3.6 4.1 3.6 4.2  CL 91* 100 103 102  CO2 20* 22 22 20*  GLUCOSE 490* 350* 195* 64*  BUN '14 13 18 '$ 22*  CREATININE 1.09* 0.76 1.16* 1.22*  CALCIUM 8.4* 8.4* 8.5* 8.4*    GFR: Estimated Creatinine Clearance: 55.9 mL/min (A) (by C-G formula based on SCr of 1.22 mg/dL (H)). Liver Function Tests: Recent Labs  Lab 06/24/22 1848 06/26/22 0537 06/27/22 0425  AST 23 31 55*  ALT '14 13 16  '$ ALKPHOS 151* 144* 165*  BILITOT 2.8* 2.3* 1.8*  PROT 8.1 6.9 7.2  ALBUMIN 3.1* 2.6* 2.7*    Recent Labs  Lab 06/24/22 1848  LIPASE 24    No results for input(s): "AMMONIA" in the last 168 hours. Coagulation Profile: Recent Labs  Lab 06/25/22 1918 06/26/22 0537 06/27/22 0425  INR 2.5* 2.8* 3.5*    Cardiac Enzymes: No results for input(s): "CKTOTAL", "CKMB", "CKMBINDEX", "TROPONINI" in the last 168 hours. BNP (last 3 results) No results for input(s): "PROBNP" in the last 8760 hours. HbA1C: No results for input(s): "HGBA1C" in the last 72 hours. CBG: Recent Labs  Lab 06/26/22 2130 06/27/22 0515 06/27/22 0535 06/27/22 0804 06/27/22 1051  GLUCAP 101* 67* 139* 70 184*    Lipid Profile: No results for input(s): "CHOL", "HDL", "LDLCALC", "TRIG", "CHOLHDL", "LDLDIRECT" in the last 72 hours. Thyroid Function Tests: Recent Labs    06/25/22 0642  TSH 1.887    Anemia Panel: No results for input(s): "VITAMINB12", "FOLATE", "FERRITIN", "TIBC", "IRON", "RETICCTPCT" in the last 72 hours. Sepsis Labs: No results for input(s): "PROCALCITON", "LATICACIDVEN" in the last 168 hours.  Recent Results (from the past 240 hour(s))  Resp Panel by RT-PCR (Flu A&B, Covid) Anterior Nasal Swab     Status: None   Collection Time: 06/24/22   6:33 PM   Specimen: Anterior Nasal Swab  Result Value Ref Range Status   SARS Coronavirus 2 by RT PCR NEGATIVE NEGATIVE Final    Comment: (NOTE) SARS-CoV-2 target nucleic acids are NOT DETECTED.  The SARS-CoV-2 RNA is generally detectable in upper respiratory specimens during the acute phase of infection. The lowest concentration of SARS-CoV-2 viral copies this assay can detect is 138 copies/mL. A negative result does not preclude SARS-Cov-2 infection and should not be used as the sole basis for treatment or other patient management decisions. A negative result may occur with  improper specimen collection/handling, submission of specimen other than nasopharyngeal swab, presence of viral mutation(s) within the areas targeted by this assay, and inadequate number of viral copies(<138 copies/mL). A negative result must be combined with clinical observations, patient history, and epidemiological information. The expected result is Negative.  Fact Sheet for Patients:  EntrepreneurPulse.com.au  Fact Sheet for Healthcare Providers:  IncredibleEmployment.be  This test is no t yet approved or cleared by the [place] FDA and  has been authorized for detection and/or diagnosis of SARS-CoV-2 by FDA under an Emergency Use  Authorization (EUA). This EUA will remain  in effect (meaning this test can be used) for the duration of the COVID-19 declaration under Section 564(b)(1) of the Act, 21 U.S.C.section 360bbb-3(b)(1), unless the authorization is terminated  or revoked sooner.       Influenza A by PCR NEGATIVE NEGATIVE Final   Influenza B by PCR NEGATIVE NEGATIVE Final    Comment: (NOTE) The Xpert Xpress SARS-CoV-2/FLU/RSV plus assay is intended as an aid in the diagnosis of influenza from Nasopharyngeal swab specimens and should not be used as a sole basis for treatment. Nasal washings and aspirates are unacceptable for Xpert Xpress  SARS-CoV-2/FLU/RSV testing.  Fact Sheet for Patients: EntrepreneurPulse.com.au  Fact Sheet for Healthcare Providers: IncredibleEmployment.be  This test is not yet approved or cleared by the [place] FDA and has been authorized for detection and/or diagnosis of SARS-CoV-2 by FDA under an Emergency Use Authorization (EUA). This EUA will remain in effect (meaning this test can be used) for the duration of the COVID-19 declaration under Section 564(b)(1) of the Act, 21 U.S.C. section 360bbb-3(b)(1), unless the authorization is terminated or revoked.  Performed at [facility], [place] [address] [place], [place] [address]       Radiology Studies: No results found.    Scheduled Meds:  atorvastatin  40 mg Oral Daily   carvedilol  3.125 mg Oral BID WC   furosemide  80 mg Oral q morning   insulin aspart  0-15 Units Subcutaneous TID WC   insulin aspart  0-5 Units Subcutaneous QHS   insulin detemir  25 Units Subcutaneous Daily   losartan  25 mg Oral Daily   metoCLOPramide (REGLAN) injection  5 mg Intravenous Q8H   pantoprazole  40 mg Oral Daily   senna-docusate  1 tablet Oral BID   sertraline  50 mg Oral Daily   spironolactone  12.5 mg Oral Daily   traZODone  50 mg Oral QHS   warfarin  2.5 mg Oral ONCE-1600   Warfarin - Pharmacist Dosing Inpatient   Does not apply q1600   Continuous Infusions:   LOS: 1 day     Dessa Phi, DO [facility] 06/27/2022, 1:42 PM   Available via Epic secure chat 7am-7pm After these hours, please refer to coverage provider listed on [email or web address]

## 2022-06-28 DIAGNOSIS — R112 Nausea with vomiting, unspecified: Secondary | ICD-10-CM | POA: Diagnosis not present

## 2022-06-28 LAB — GLUCOSE, CAPILLARY
Glucose-Capillary: 65 mg/dL — ABNORMAL LOW (ref 70–99)
Glucose-Capillary: 160 mg/dL — ABNORMAL HIGH (ref 70–99)
Glucose-Capillary: 167 mg/dL — ABNORMAL HIGH (ref 70–99)
Glucose-Capillary: 156 mg/dL — ABNORMAL HIGH (ref 70–99)
Glucose-Capillary: 228 mg/dL — ABNORMAL HIGH (ref 70–99)
Glucose-Capillary: 150 mg/dL — ABNORMAL HIGH (ref 70–99)

## 2022-06-28 LAB — BASIC METABOLIC PANEL
Anion gap: 9 (ref 5–15)
BUN: 24 mg/dL — ABNORMAL HIGH (ref 6–20)
CO2: 25 mmol/L (ref 22–32)
Calcium: 8.6 mg/dL — ABNORMAL LOW (ref 8.9–10.3)
Chloride: 98 mmol/L (ref 98–111)
Creatinine, Ser: 1.04 mg/dL — ABNORMAL HIGH (ref 0.44–1.00)
GFR, Estimated: >60 mL/min (ref >60)
Glucose, Bld: 167 mg/dL — ABNORMAL HIGH (ref 70–99)
Potassium: 4 mmol/L (ref 3.5–5.1)
Sodium: 132 mmol/L — ABNORMAL LOW (ref 135–145)

## 2022-06-28 LAB — PROTIME-INR
INR: 4.1 (ref 0.8–1.2)
Prothrombin Time: 39.1 seconds — ABNORMAL HIGH (ref 11.4–15.2)

## 2022-06-28 MED ORDER — INSULIN ASPART 100 UNIT/ML IJ SOLN
0.0000 [IU] | Freq: Three times a day (TID) | INTRAMUSCULAR | Status: DC
  Administered 2022-06-28: 3 [IU] via SUBCUTANEOUS
  Administered 2022-06-28 – 2022-06-29 (×2): 2 [IU] via SUBCUTANEOUS
  Administered 2022-06-29 – 2022-06-30 (×2): 3 [IU] via SUBCUTANEOUS
  Administered 2022-06-30: 2 [IU] via SUBCUTANEOUS

## 2022-06-28 MED ORDER — FUROSEMIDE 10 MG/ML IJ SOLN
80.0000 mg | Freq: Two times a day (BID) | INTRAMUSCULAR | Status: DC
  Administered 2022-06-28 – 2022-06-30 (×5): 80 mg via INTRAVENOUS
  Filled 2022-06-28 (×5): qty 8

## 2022-06-28 MED ORDER — METOCLOPRAMIDE HCL 5 MG/ML IJ SOLN
10.0000 mg | Freq: Four times a day (QID) | INTRAMUSCULAR | Status: DC
  Administered 2022-06-28 – 2022-06-30 (×10): 10 mg via INTRAVENOUS
  Filled 2022-06-28 (×12): qty 2

## 2022-06-28 MED ORDER — INSULIN DETEMIR 100 UNIT/ML ~~LOC~~ SOLN
15.0000 [IU] | Freq: Every day | SUBCUTANEOUS | Status: DC
  Administered 2022-06-28: 15 [IU] via SUBCUTANEOUS
  Filled 2022-06-28 (×3): qty 0.15

## 2022-06-28 NOTE — Progress Notes (Signed)
[place] for Warfarin Indication: atrial fibrillation, LV thrombus  Allergies  Allergen Reactions   Fish Allergy Swelling   Shellfish Allergy Anaphylaxis, Swelling and Other (See Comments)    Airway involvement including EMS - Has Epi-Pen   Metformin And Related Diarrhea   Trulicity [Dulaglutide] Nausea And Vomiting and Other (See Comments)    Multiple episodes of vomiting over multiple days   Nsaids Other (See Comments)    Per PCP interferes with daily meds (heart failure)   Advil [Ibuprofen] Other (See Comments)    Per PCP interferes with daily meds (heart failure)    Patient Measurements: Height: '4\' 11"'$  (149.9 cm) Weight: 85.7 kg (189 lb) IBW/kg (Calculated) : 43.2  Vital Signs: Temp: 97.7 F (36.5 C) (12/05 0410) Temp Source: Oral (12/05 0410) BP: 147/113 (12/05 0410) Pulse Rate: 94 (12/05 0410)  Labs: Recent Labs    06/26/22 0537 06/27/22 0425 06/28/22 0548  HGB 10.2*  --   --   HCT 33.8*  --   --   PLT 252  --   --   LABPROT 29.4* 34.9* 39.1*  INR 2.8* 3.5* 4.1*  CREATININE 1.16* 1.22* 1.04*     Estimated Creatinine Clearance: 65.6 mL/min (A) (by C-G formula based on SCr of 1.04 mg/dL (H)).  Medications:  Scheduled:   atorvastatin  40 mg Oral Daily   carvedilol  3.125 mg Oral BID WC   furosemide  80 mg Oral q morning   insulin aspart  0-15 Units Subcutaneous TID WC   insulin aspart  0-5 Units Subcutaneous QHS   insulin detemir  25 Units Subcutaneous Daily   losartan  25 mg Oral Daily   metoCLOPramide (REGLAN) injection  5 mg Intravenous Q8H   pantoprazole  40 mg Oral Daily   senna-docusate  1 tablet Oral BID   sertraline  50 mg Oral Daily   spironolactone  12.5 mg Oral Daily   traZODone  50 mg Oral QHS   Warfarin - Pharmacist Dosing Inpatient   Does not apply q1600   Infusions:  PRN: acetaminophen **OR** acetaminophen, labetalol, morphine injection, ondansetron (ZOFRAN) IV, polyethylene  glycol  Assessment: 46 yo female on chronic warfarin for afib, LV thrombus presents to ED with chest pain, nausea and vomiting. Pharmacy consulted to continue dosing warfarin while inpatient.  Home warfarin regimen reported as 11.'25mg'$  daily except 7.'5mg'$  on Mondays - last taken 11/29  Baseline INR therapeutic Prior anticoagulation: warfarin 11.25 mg daily except 7.5 mg on Monday; LD 11/29  Significant events:  Today, 06/28/2022: CBC: Hgb mildly decreased from admission; Plt stable WNL INR supratherapeutic and increased from yesterday despite reduced warfarin dose Major drug interactions: none noted No bleeding issues per nursing Meals charted at 100%, but RN reports pt still with nausea, not eating much, frequent hypoglycemia  Goal of Therapy: INR 2-3  Plan: Hold warfarin today Daily INR CBC at least q72 hr while on warfarin Monitor for signs of bleeding or thrombosis Not yet ready to resume PTA warfarin dosing d/t reduced PO intake   Reuel Boom, PharmD, BCPS [phone] 06/28/2022, 7:28 AM

## 2022-06-28 NOTE — Progress Notes (Signed)
Pt hypoglycemic x2  thus far this shift, diaphoretic upon assessment. CBG improved with juice and snack. Per pt she did not have lunch nor dinner, didn't have an appetite. Hospitalist notified suggested CBG Q4H as pt is not fourth coming about hypoglycemic symptoms. Pt re-educated on s/s hypoglycemia and education to notify staff if she experiences symptoms. Current CBG 160. Pt remains alert and oriented.

## 2022-06-28 NOTE — Progress Notes (Signed)
PROGRESS NOTE    [name]  ENI:[id number] DOB: [birth year] DOA: 06/24/2022 PCP: Donney Dice, DO     Brief Narrative:  [name] is a 46 y.o. female with medical history significant of abnormal Pap smear, chronic combined systolic and diastolic heart failure, normocytic anemia, asthma, left ventricle thrombus, paroxysmal atrial fibrillation on warfarin, CAD, history NSTEMI, chronic elevated troponin, type 2 diabetes, history of episodes of DKA, hydrosalpinx, polycystic ovarian disease, hypertension, episodes of hypertensive emergency, depression, migraine headaches, obesity currently class II obesity with a BMI of 38.17 kg/m, obstructive sleep apnea not on CPAP who presented to the emergency department due to chest pain, nausea and vomiting for a month.  She was seen by GI (Dr. Candis Schatz) back in April with workup showing gastritis and no H. pylori.   New events last 24 hours / Subjective: Patient continues to have morning hypoglycemic episodes.  Has no appetite and did not eat much yesterday.  Abdominal pain slightly improved today.  Assessment & Plan:   Principal Problem:   Nausea and vomiting Active Problems:   Poorly controlled type 2 diabetes mellitus (HCC)   Anemia of chronic disease   Obstructive sleep apnea   Hyperlipidemia associated with type 2 diabetes mellitus (HCC)   Chronic combined systolic and diastolic heart failure (HCC)   Paroxysmal atrial fibrillation (HCC)   Chronic chest wall pain   History of left ventricular mural thrombus   Hypertensive urgency   Mild protein malnutrition (HCC)   Class 2 obesity   Hyperbilirubinemia   Nausea & vomiting   Nausea, vomiting -RUQ [place]: Unremarkable right upper quadrant ultrasound -Holding IVF due to hx CHF -Antiemetics PRN, Added Reglan  Diabetes mellitus type 2, poorly controlled with hyperglycemia and hypoglycemia -A1c > 15.5  -Levemir, sliding scale insulin -dose decreased due to hypoglycemic  episode -Diabetes coordinator consulted  Acute on chronic combined systolic diastolic heart failure -EF <61%, grade 3 diastolic dysfunction -Coreg, cozaar, spironolactone  -Strict I's and O's -Fluid overloaded today.  Change Lasix to IV -Consult cardiology   Demand ischemia -Troponin 99, 87  Hyperlipidemia -Lipitor  History left ventricular mural thrombus -Coumadin -Pharmacy managing Coumadin.  INR 4.1 today  OSA -CPAP nightly   DVT prophylaxis: Coumadin   Code Status: Full Family Communication: None at bedside  Disposition Plan:  Status is: Inpatient Remains inpatient appropriate because: IV Lasix    Antimicrobials:  Anti-infectives (From admission, onward)    None        Objective: Vitals:   06/27/22 0507 06/27/22 1314 06/27/22 2009 06/28/22 0410  BP: (!) 123/93 (!) 130/96 (!) 132/100 (!) 147/113  Pulse: 94 97 89 94  Resp: '17 18 17 16  '$ Temp: 97.6 F (36.4 C) 97.6 F (36.4 C) 97.9 F (36.6 C) 97.7 F (36.5 C)  TempSrc: Oral Oral Oral Oral  SpO2: 100% 98% 100% 99%  Weight:      Height:        Intake/Output Summary (Last 24 hours) at 06/28/2022 1206 Last data filed at 06/28/2022 0413 Gross per 24 hour  Intake --  Output 500 ml  Net -500 ml    Filed Weights   06/24/22 1856  Weight: 85.7 kg    Examination:  General exam: Appears calm  Respiratory system: Clear to auscultation. Respiratory effort normal. No respiratory distress. No conversational dyspnea.  Cardiovascular system: S1 & S2 heard, RRR. No murmurs. +Bilateral pedal edema. Gastrointestinal system: Abdomen is nondistended, soft Central nervous system: Alert and oriented. No focal neurological deficits.  Speech clear.  Extremities: Symmetric in appearance  Skin: No rashes, lesions or ulcers on exposed skin  Psychiatry: Judgement and insight appear normal. Mood & affect appropriate.   Data Reviewed: I have personally reviewed following labs and imaging studies  CBC: Recent Labs   Lab 06/24/22 1848 06/26/22 0537  WBC 8.5 7.1  NEUTROABS 5.2  --   HGB 11.3* 10.2*  HCT 36.4 33.8*  MCV 69.2* 69.0*  PLT 301 962    Basic Metabolic Panel: Recent Labs  Lab 06/24/22 1848 06/25/22 0642 06/26/22 0537 06/27/22 0425 06/28/22 0548  NA 124* 133* 134* 133* 132*  K 3.6 4.1 3.6 4.2 4.0  CL 91* 100 103 102 98  CO2 20* 22 22 20* 25  GLUCOSE 490* 350* 195* 64* 167*  BUN '14 13 18 '$ 22* 24*  CREATININE 1.09* 0.76 1.16* 1.22* 1.04*  CALCIUM 8.4* 8.4* 8.5* 8.4* 8.6*    GFR: Estimated Creatinine Clearance: 65.6 mL/min (A) (by C-G formula based on SCr of 1.04 mg/dL (H)). Liver Function Tests: Recent Labs  Lab 06/24/22 1848 06/26/22 0537 06/27/22 0425  AST 23 31 55*  ALT '14 13 16  '$ ALKPHOS 151* 144* 165*  BILITOT 2.8* 2.3* 1.8*  PROT 8.1 6.9 7.2  ALBUMIN 3.1* 2.6* 2.7*    Recent Labs  Lab 06/24/22 1848  LIPASE 24    No results for input(s): "AMMONIA" in the last 168 hours. Coagulation Profile: Recent Labs  Lab 06/25/22 1918 06/26/22 0537 06/27/22 0425 06/28/22 0548  INR 2.5* 2.8* 3.5* 4.1*    Cardiac Enzymes: No results for input(s): "CKTOTAL", "CKMB", "CKMBINDEX", "TROPONINI" in the last 168 hours. BNP (last 3 results) No results for input(s): "PROBNP" in the last 8760 hours. HbA1C: No results for input(s): "HGBA1C" in the last 72 hours. CBG: Recent Labs  Lab 06/27/22 2106 06/28/22 0227 06/28/22 0304 06/28/22 0727 06/28/22 1134  GLUCAP 134* 65* 160* 167* 156*    Lipid Profile: No results for input(s): "CHOL", "HDL", "LDLCALC", "TRIG", "CHOLHDL", "LDLDIRECT" in the last 72 hours. Thyroid Function Tests: No results for input(s): "TSH", "T4TOTAL", "FREET4", "T3FREE", "THYROIDAB" in the last 72 hours.  Anemia Panel: No results for input(s): "VITAMINB12", "FOLATE", "FERRITIN", "TIBC", "IRON", "RETICCTPCT" in the last 72 hours. Sepsis Labs: No results for input(s): "PROCALCITON", "LATICACIDVEN" in the last 168 hours.  Recent Results (from  the past 240 hour(s))  Resp Panel by RT-PCR (Flu A&B, Covid) Anterior Nasal Swab     Status: None   Collection Time: 06/24/22  6:33 PM   Specimen: Anterior Nasal Swab  Result Value Ref Range Status   SARS Coronavirus 2 by RT PCR NEGATIVE NEGATIVE Final    Comment: (NOTE) SARS-CoV-2 target nucleic acids are NOT DETECTED.  The SARS-CoV-2 RNA is generally detectable in upper respiratory specimens during the acute phase of infection. The lowest concentration of SARS-CoV-2 viral copies this assay can detect is 138 copies/mL. A negative result does not preclude SARS-Cov-2 infection and should not be used as the sole basis for treatment or other patient management decisions. A negative result may occur with  improper specimen collection/handling, submission of specimen other than nasopharyngeal swab, presence of viral mutation(s) within the areas targeted by this assay, and inadequate number of viral copies(<138 copies/mL). A negative result must be combined with clinical observations, patient history, and epidemiological information. The expected result is Negative.  Fact Sheet for Patients:  EntrepreneurPulse.com.au  Fact Sheet for Healthcare Providers:  IncredibleEmployment.be  This test is no t yet approved or cleared by the  [place] Architectural technologist and  has been authorized for detection and/or diagnosis of SARS-CoV-2 by FDA under an Print production planner (EUA). This EUA will remain  in effect (meaning this test can be used) for the duration of the COVID-19 declaration under Section 564(b)(1) of the Act, 21 U.S.C.section 360bbb-3(b)(1), unless the authorization is terminated  or revoked sooner.       Influenza A by PCR NEGATIVE NEGATIVE Final   Influenza B by PCR NEGATIVE NEGATIVE Final    Comment: (NOTE) The Xpert Xpress SARS-CoV-2/FLU/RSV plus assay is intended as an aid in the diagnosis of influenza from Nasopharyngeal swab specimens  and should not be used as a sole basis for treatment. Nasal washings and aspirates are unacceptable for Xpert Xpress SARS-CoV-2/FLU/RSV testing.  Fact Sheet for Patients: EntrepreneurPulse.com.au  Fact Sheet for Healthcare Providers: IncredibleEmployment.be  This test is not yet approved or cleared by the [place] FDA and has been authorized for detection and/or diagnosis of SARS-CoV-2 by FDA under an Emergency Use Authorization (EUA). This EUA will remain in effect (meaning this test can be used) for the duration of the COVID-19 declaration under Section 564(b)(1) of the Act, 21 U.S.C. section 360bbb-3(b)(1), unless the authorization is terminated or revoked.  Performed at [facility], [place] [address] [place], [place] [address]       Radiology Studies: No results found.    Scheduled Meds:  atorvastatin  40 mg Oral Daily   carvedilol  3.125 mg Oral BID WC   furosemide  80 mg Intravenous BID   insulin aspart  0-9 Units Subcutaneous TID WC   insulin detemir  15 Units Subcutaneous Daily   losartan  25 mg Oral Daily   metoCLOPramide (REGLAN) injection  10 mg Intravenous Q6H   pantoprazole  40 mg Oral Daily   senna-docusate  1 tablet Oral BID   sertraline  50 mg Oral Daily   spironolactone  12.5 mg Oral Daily   traZODone  50 mg Oral QHS   Warfarin - Pharmacist Dosing Inpatient   Does not apply q1600   Continuous Infusions:   LOS: 2 days     Dessa Phi, DO [facility] 06/28/2022, 12:06 PM   Available via Epic secure chat 7am-7pm After these hours, please refer to coverage provider listed on [email or web address]

## 2022-06-28 NOTE — Progress Notes (Signed)
Pt refused cpap tonight.  Pt stated she does not use one at home.

## 2022-06-29 DIAGNOSIS — R112 Nausea with vomiting, unspecified: Secondary | ICD-10-CM | POA: Diagnosis not present

## 2022-06-29 DIAGNOSIS — I2510 Atherosclerotic heart disease of native coronary artery without angina pectoris: Secondary | ICD-10-CM

## 2022-06-29 DIAGNOSIS — E7800 Pure hypercholesterolemia, unspecified: Secondary | ICD-10-CM

## 2022-06-29 DIAGNOSIS — I10 Essential (primary) hypertension: Secondary | ICD-10-CM

## 2022-06-29 DIAGNOSIS — I2583 Coronary atherosclerosis due to lipid rich plaque: Secondary | ICD-10-CM

## 2022-06-29 DIAGNOSIS — I5043 Acute on chronic combined systolic (congestive) and diastolic (congestive) heart failure: Secondary | ICD-10-CM

## 2022-06-29 DIAGNOSIS — I513 Intracardiac thrombosis, not elsewhere classified: Secondary | ICD-10-CM

## 2022-06-29 LAB — CBC
HCT: 35.3 % — ABNORMAL LOW (ref 36.0–46.0)
HCT: 34.6 % — ABNORMAL LOW (ref 36.0–46.0)
Hemoglobin: 10.7 g/dL — ABNORMAL LOW (ref 12.0–15.0)
Hemoglobin: 10.7 g/dL — ABNORMAL LOW (ref 12.0–15.0)
MCH: 20.3 pg — ABNORMAL LOW (ref 26.0–34.0)
MCH: 20.8 pg — ABNORMAL LOW (ref 26.0–34.0)
MCHC: 30.3 g/dL (ref 30.0–36.0)
MCHC: 30.9 g/dL (ref 30.0–36.0)
MCV: 67 fL — ABNORMAL LOW (ref 80.0–100.0)
MCV: 67.2 fL — ABNORMAL LOW (ref 80.0–100.0)
Platelets: 303 10*3/uL (ref 150–400)
Platelets: 291 10*3/uL (ref 150–400)
RBC: 5.27 MIL/uL — ABNORMAL HIGH (ref 3.87–5.11)
RBC: 5.15 MIL/uL — ABNORMAL HIGH (ref 3.87–5.11)
RDW: 21.4 % — ABNORMAL HIGH (ref 11.5–15.5)
RDW: 21.2 % — ABNORMAL HIGH (ref 11.5–15.5)
WBC: 6.6 10*3/uL (ref 4.0–10.5)
WBC: 6.2 10*3/uL (ref 4.0–10.5)
nRBC: 0.3 % — ABNORMAL HIGH (ref 0.0–0.2)
nRBC: 0.3 % — ABNORMAL HIGH (ref 0.0–0.2)

## 2022-06-29 LAB — BASIC METABOLIC PANEL
Anion gap: 10 (ref 5–15)
Anion gap: 10 (ref 5–15)
BUN: 22 mg/dL — ABNORMAL HIGH (ref 6–20)
BUN: 22 mg/dL — ABNORMAL HIGH (ref 6–20)
CO2: 27 mmol/L (ref 22–32)
CO2: 27 mmol/L (ref 22–32)
Calcium: 8.7 mg/dL — ABNORMAL LOW (ref 8.9–10.3)
Calcium: 8.4 mg/dL — ABNORMAL LOW (ref 8.9–10.3)
Chloride: 98 mmol/L (ref 98–111)
Chloride: 97 mmol/L — ABNORMAL LOW (ref 98–111)
Creatinine, Ser: 0.88 mg/dL (ref 0.44–1.00)
Creatinine, Ser: 1.09 mg/dL — ABNORMAL HIGH (ref 0.44–1.00)
GFR, Estimated: >60 mL/min (ref >60)
GFR, Estimated: >60 mL/min (ref >60)
Glucose, Bld: 111 mg/dL — ABNORMAL HIGH (ref 70–99)
Glucose, Bld: 207 mg/dL — ABNORMAL HIGH (ref 70–99)
Potassium: 3.8 mmol/L (ref 3.5–5.1)
Potassium: 4 mmol/L (ref 3.5–5.1)
Sodium: 135 mmol/L (ref 135–145)
Sodium: 134 mmol/L — ABNORMAL LOW (ref 135–145)

## 2022-06-29 LAB — IRON AND TIBC
Iron: 25 ug/dL — ABNORMAL LOW (ref 28–170)
Saturation Ratios: 6 % — ABNORMAL LOW (ref 10.4–31.8)
TIBC: 392 ug/dL (ref 250–450)
UIBC: 367 ug/dL

## 2022-06-29 LAB — PROTIME-INR
INR: 3.7 — ABNORMAL HIGH (ref 0.8–1.2)
Prothrombin Time: 36.6 seconds — ABNORMAL HIGH (ref 11.4–15.2)

## 2022-06-29 LAB — GLUCOSE, CAPILLARY
Glucose-Capillary: 86 mg/dL (ref 70–99)
Glucose-Capillary: 102 mg/dL — ABNORMAL HIGH (ref 70–99)
Glucose-Capillary: 195 mg/dL — ABNORMAL HIGH (ref 70–99)
Glucose-Capillary: 238 mg/dL — ABNORMAL HIGH (ref 70–99)
Glucose-Capillary: 202 mg/dL — ABNORMAL HIGH (ref 70–99)

## 2022-06-29 LAB — VITAMIN B12: Vitamin B-12: 1470 pg/mL — ABNORMAL HIGH (ref 180–914)

## 2022-06-29 LAB — FERRITIN: Ferritin: 46 ng/mL (ref 11–307)

## 2022-06-29 MED ORDER — SENNOSIDES-DOCUSATE SODIUM 8.6-50 MG PO TABS: 1.0000 | ORAL_TABLET | Freq: Every evening | ORAL | Status: DC | PRN

## 2022-06-29 MED ORDER — SERTRALINE HCL 50 MG PO TABS: 50.0000 mg | ORAL_TABLET | Freq: Every day | ORAL | Status: DC

## 2022-06-29 MED ORDER — METOPROLOL TARTRATE 5 MG/5ML IV SOLN: 5.0000 mg | INTRAVENOUS | Status: DC | PRN

## 2022-06-29 MED ORDER — EMPAGLIFLOZIN 10 MG PO TABS
10.0000 mg | ORAL_TABLET | Freq: Every day | ORAL | Status: DC
  Administered 2022-06-29 – 2022-07-01 (×3): 10 mg via ORAL
  Filled 2022-06-29 (×3): qty 1

## 2022-06-29 MED ORDER — IPRATROPIUM-ALBUTEROL 0.5-2.5 (3) MG/3ML IN SOLN: 3.0000 mL | RESPIRATORY_TRACT | Status: DC | PRN

## 2022-06-29 MED ORDER — SODIUM CHLORIDE 0.9 % IV SOLN
250.0000 mg | Freq: Every day | INTRAVENOUS | Status: DC
  Administered 2022-06-29 – 2022-06-30 (×2): 250 mg via INTRAVENOUS
  Filled 2022-06-29 (×2): qty 20

## 2022-06-29 MED ORDER — HYDRALAZINE HCL 20 MG/ML IJ SOLN: 10.0000 mg | INTRAMUSCULAR | Status: DC | PRN

## 2022-06-29 NOTE — Progress Notes (Addendum)
Pt refuses nocturnal cpap, does not wear it at home.  Rx changed to prn.

## 2022-06-29 NOTE — Progress Notes (Signed)
PROGRESS NOTE    [name]  CBS:[id number] DOB: 1976/01/11 DOA: 06/24/2022 PCP: Donney Dice, DO   Brief Narrative:   46 y.o. female with medical history significant of abnormal Pap smear, chronic combined systolic and diastolic heart failure, normocytic anemia, asthma, left ventricle thrombus, paroxysmal atrial fibrillation on warfarin, CAD, history NSTEMI, chronic elevated troponin, type 2 diabetes, history of episodes of DKA, hydrosalpinx, polycystic ovarian disease, hypertension, episodes of hypertensive emergency, depression, migraine headaches, obesity currently class II obesity with a BMI of 38.17 kg/m, obstructive sleep apnea not on CPAP who presented to the emergency department due to chest pain, nausea and vomiting for a month.  She was seen by GI (Dr. Candis Schatz) back in April with workup showing gastritis and no H. pylori.  Upon admission patient was found to be in fluid overload requiring IV Lasix, cardiology team was consulted.   Assessment & Plan:  Principal Problem:   Nausea and vomiting Active Problems:   Poorly controlled type 2 diabetes mellitus (HCC)   Anemia of chronic disease   Obstructive sleep apnea   Hyperlipidemia associated with type 2 diabetes mellitus (HCC)   Chronic combined systolic and diastolic heart failure (HCC)   Paroxysmal atrial fibrillation (HCC)   Chronic chest wall pain   History of left ventricular mural thrombus   Hypertensive urgency   Mild protein malnutrition (HCC)   Class 2 obesity   Hyperbilirubinemia   Nausea & vomiting        Nausea, vomiting -I suspect from intestinal wall edema.  Right upper quadrant ultrasound is negative.  Supportive care, antiemetics as needed.  This could certainly be related to gastroparesis, getting Reglan. -Lipase and LFTs are overall normal.   Diabetes mellitus type 2, poorly controlled with hyperglycemia and hypoglycemia -A1c > 15.5.  Currently on Levemir 15 units daily, sliding scale and  Accu-Cheks. -Followed by diabetic coordinator   Acute on chronic combined systolic diastolic heart failure -EF <84%, grade 3 diastolic dysfunction.  Currently on Coreg, Cozaar, Aldactone.  Resume home Jardiance. Strict input and output.  Monitor and replete electrolytes as necessary - Getting IV Lasix.  Cardiology is following  Anemia of chronic disease, microcytosis - Hemoglobin appears to be stable around 10.  Iron studies shows iron deficiency, will start IV iron while patient is here   Demand ischemia -Troponin 99, 87.  Remains chest pain-free   Hyperlipidemia -Lipitor   History left ventricular mural thrombus -On Coumadin being managed by pharmacy team.   OSA -CPAP nightly     DVT prophylaxis: On Coumadin Code Status: Full code Family Communication:    Status is: Inpatient Continue hospital stay for aggressive IV diuresis    Subjective: Patient tells me her nausea is little better this morning.  Does have exertional shortness of breath   Examination:  General exam: Appears calm and comfortable  Respiratory system: Bibasilar crackles Cardiovascular system: S1 & S2 heard, RRR. No JVD, murmurs, rubs, gallops or clicks.  2+ bilateral lower extremity pitting edema Gastrointestinal system: Abdomen is nondistended, soft and nontender. No organomegaly or masses felt. Normal bowel sounds heard. Central nervous system: Alert and oriented. No focal neurological deficits. Extremities: Symmetric 5 x 5 power. Skin: No rashes, lesions or ulcers Psychiatry: Judgement and insight appear normal. Mood & affect appropriate.     Objective: Vitals:   06/28/22 1348 06/28/22 2008 06/29/22 0601 06/29/22 0805  BP: (!) 135/105 (!) 116/95 (!) 147/116 (!) 149/107  Pulse: 91 85 100 (!) 101  Resp: 18 16 16  Temp: 97.9 F (36.6 C) 97.9 F (36.6 C) 97.6 F (36.4 C)   TempSrc: Oral Oral Oral   SpO2: 100% 99% 96%   Weight:      Height:        Intake/Output Summary (Last 24 hours)  at 06/29/2022 0824 Last data filed at 06/29/2022 0205 Gross per 24 hour  Intake 600 ml  Output --  Net 600 ml   Filed Weights   06/24/22 1856  Weight: 85.7 kg     Data Reviewed:   CBC: Recent Labs  Lab 06/24/22 1848 06/26/22 0537 06/29/22 0648  WBC 8.5 7.1 6.6  NEUTROABS 5.2  --   --   HGB 11.3* 10.2* 10.7*  HCT 36.4 33.8* 35.3*  MCV 69.2* 69.0* 67.0*  PLT 301 252 937   Basic Metabolic Panel: Recent Labs  Lab 06/25/22 0642 06/26/22 0537 06/27/22 0425 06/28/22 0548 06/29/22 0648  NA 133* 134* 133* 132* 135  K 4.1 3.6 4.2 4.0 3.8  CL 100 103 102 98 98  CO2 22 22 20* 25 27  GLUCOSE 350* 195* 64* 167* 111*  BUN 13 18 22* 24* 22*  CREATININE 0.76 1.16* 1.22* 1.04* 0.88  CALCIUM 8.4* 8.5* 8.4* 8.6* 8.7*   GFR: Estimated Creatinine Clearance: 77.5 mL/min (by C-G formula based on SCr of 0.88 mg/dL). Liver Function Tests: Recent Labs  Lab 06/24/22 1848 06/26/22 0537 06/27/22 0425  AST 23 31 55*  ALT '14 13 16  '$ ALKPHOS 151* 144* 165*  BILITOT 2.8* 2.3* 1.8*  PROT 8.1 6.9 7.2  ALBUMIN 3.1* 2.6* 2.7*   Recent Labs  Lab 06/24/22 1848  LIPASE 24   No results for input(s): "AMMONIA" in the last 168 hours. Coagulation Profile: Recent Labs  Lab 06/25/22 1918 06/26/22 0537 06/27/22 0425 06/28/22 0548 06/29/22 0648  INR 2.5* 2.8* 3.5* 4.1* 3.7*   Cardiac Enzymes: No results for input(s): "CKTOTAL", "CKMB", "CKMBINDEX", "TROPONINI" in the last 168 hours. BNP (last 3 results) No results for input(s): "PROBNP" in the last 8760 hours. HbA1C: No results for input(s): "HGBA1C" in the last 72 hours. CBG: Recent Labs  Lab 06/28/22 1134 06/28/22 1640 06/28/22 2014 06/29/22 0200 06/29/22 0746  GLUCAP 156* 228* 150* 86 102*   Lipid Profile: No results for input(s): "CHOL", "HDL", "LDLCALC", "TRIG", "CHOLHDL", "LDLDIRECT" in the last 72 hours. Thyroid Function Tests: No results for input(s): "TSH", "T4TOTAL", "FREET4", "T3FREE", "THYROIDAB" in the last 72  hours. Anemia Panel: No results for input(s): "VITAMINB12", "FOLATE", "FERRITIN", "TIBC", "IRON", "RETICCTPCT" in the last 72 hours. Sepsis Labs: No results for input(s): "PROCALCITON", "LATICACIDVEN" in the last 168 hours.  Recent Results (from the past 240 hour(s))  Resp Panel by RT-PCR (Flu A&B, Covid) Anterior Nasal Swab     Status: None   Collection Time: 06/24/22  6:33 PM   Specimen: Anterior Nasal Swab  Result Value Ref Range Status   SARS Coronavirus 2 by RT PCR NEGATIVE NEGATIVE Final    Comment: (NOTE) SARS-CoV-2 target nucleic acids are NOT DETECTED.  The SARS-CoV-2 RNA is generally detectable in upper respiratory specimens during the acute phase of infection. The lowest concentration of SARS-CoV-2 viral copies this assay can detect is 138 copies/mL. A negative result does not preclude SARS-Cov-2 infection and should not be used as the sole basis for treatment or other patient management decisions. A negative result may occur with  improper specimen collection/handling, submission of specimen other than nasopharyngeal swab, presence of viral mutation(s) within the areas targeted by this assay, and  inadequate number of viral copies(<138 copies/mL). A negative result must be combined with clinical observations, patient history, and epidemiological information. The expected result is Negative.  Fact Sheet for Patients:  EntrepreneurPulse.com.au  Fact Sheet for Healthcare Providers:  IncredibleEmployment.be  This test is no t yet approved or cleared by the [place] FDA and  has been authorized for detection and/or diagnosis of SARS-CoV-2 by FDA under an Emergency Use Authorization (EUA). This EUA will remain  in effect (meaning this test can be used) for the duration of the COVID-19 declaration under Section 564(b)(1) of the Act, 21 U.S.C.section 360bbb-3(b)(1), unless the authorization is terminated  or revoked sooner.        Influenza A by PCR NEGATIVE NEGATIVE Final   Influenza B by PCR NEGATIVE NEGATIVE Final    Comment: (NOTE) The Xpert Xpress SARS-CoV-2/FLU/RSV plus assay is intended as an aid in the diagnosis of influenza from Nasopharyngeal swab specimens and should not be used as a sole basis for treatment. Nasal washings and aspirates are unacceptable for Xpert Xpress SARS-CoV-2/FLU/RSV testing.  Fact Sheet for Patients: EntrepreneurPulse.com.au  Fact Sheet for Healthcare Providers: IncredibleEmployment.be  This test is not yet approved or cleared by the [place] FDA and has been authorized for detection and/or diagnosis of SARS-CoV-2 by FDA under an Emergency Use Authorization (EUA). This EUA will remain in effect (meaning this test can be used) for the duration of the COVID-19 declaration under Section 564(b)(1) of the Act, 21 U.S.C. section 360bbb-3(b)(1), unless the authorization is terminated or revoked.  Performed at [facility], [place] [address] [place], [place] [address]          Radiology Studies: No results found.      Scheduled Meds:  atorvastatin  40 mg Oral Daily   carvedilol  3.125 mg Oral BID WC   furosemide  80 mg Intravenous BID   insulin aspart  0-9 Units Subcutaneous TID WC   insulin detemir  15 Units Subcutaneous Daily   losartan  25 mg Oral Daily   metoCLOPramide (REGLAN) injection  10 mg Intravenous Q6H   pantoprazole  40 mg Oral Daily   senna-docusate  1 tablet Oral BID   sertraline  50 mg Oral Daily   spironolactone  12.5 mg Oral Daily   traZODone  50 mg Oral QHS   Warfarin - Pharmacist Dosing Inpatient   Does not apply q1600   Continuous Infusions:   LOS: 3 days   Time spent= 35 mins    Analyssa Downs Arsenio Loader, MD [facility]  If 7PM-7AM, please contact night-coverage  06/29/2022, 8:24 AM

## 2022-06-29 NOTE — Consult Note (Signed)
Cardiology Consultation   Patient ID: [name] MRN: [id number]; DOB: 03/25/1976  Admit date: 06/24/2022 Date of Consult: 06/29/2022  PCP:  [name], [place] [facility] Cardiologist:  Loralie Champagne, MD  Electrophysiologist:  Constance Haw, MD       Patient Profile:   [name] is a 46 y.o. female with a hx of chronic systolic and diastolic CHF EF 78-93%, NICM, PAF on xarelto, DM-2, anemia, LV thrombus, HTN now admitted 06/24/22 with N&V,  hyperbilirubinemia who is being seen 06/29/2022 for the evaluation of  CHF yesterday at the request of Dr. Reesa Chew.  History of Present Illness:   [name] hx NICM since 2020, with EF 20-25%. She was in atrial fib at time of that study.  She was placed on GDMT and follow up echo with EF 20-25%.  Also apical thrombus cath 03/2019 no obstructive CAD, R>L heart failure with low PAPI suggesting significant RV dysfunction, primarily pulmonary venous HTN, low cardiac output, CI 2.07. Patient was started on coumadin for LV thrombus.    Despite therapy follow up echo 09/2019 with EF <20%, moderate LVH, grade II diastolic dysfunction, severe RV dysfunction, moderately elevated pulmonary artery systolic pressure. Echo from 12/2019 did show some improvement in EF to 35-40%, grade I DD   Patient underwent Cardiac MRI to rule out myocarditis on 09/20/21. Study showed mildly dilated LV with EF 23%, diffuse hypokinesis with inferior akinesis, moderate RV systolic dysfunction with EF 32%, no definitive evidence of prior MI, infiltrative disease, myocarditis. LV thrombus was not seen on cMRI, continued on coumadin for prevention. Later, patient was transitioned to Xarelto due to poor compliance with coumadin and subtherapeutic INRs.   EF has remained 20-25% - she does admit to medication non compliance.   Admitted 02/23/22 with acute on chronic combined CHF.  Found to have a large LV thrombus.  Continued xarelto. Maintaining SR.   Was seen  in ER 05/06/22 with hypertension urgency.  She had chest pain but was atypical.  Treated with one dose of lasix. Continued coumadin.  Admitted 06/02/22 with HTN urgency, and acute CHF again due to non compliance.    Pt presented to ER 06/24/22 with chest pain and nausea and vomiting.  She was seen by GI (Dr. Candis [name]) back in April with workup showing gastritis and no H. pylori.   she was volume overloaded on admit. She has not been compliant with meds.   Na 134 K+ 4.0 BUN 22 Cr 1.09  Iron 25, iron sat 6 UIBC 367 ferritin 46 Vit B12 1470 Hgb 10.7 plts 301 WBC 6.2  INR 3.7 TSH 1.887 Hs troponin  99>>87 Resp panel neg EKG:  The EKG was personally reviewed and demonstrates:  ST with LVH  no acute changes Telemetry:  Telemetry was personally reviewed and demonstrates:  SR to SVT at 130   Past Medical History:  Diagnosis Date   Abnormal Pap smear    Acute heart failure ([place]) 01/29/2022   Acute on chronic combined systolic and diastolic CHF (congestive heart failure) ([place]) 02/19/2019   Acute on chronic congestive heart failure (HCC)    Acute on chronic congestive heart failure (HCC)    Acute on chronic diastolic CHF (congestive heart failure) ([place]) 10/19/2021   Acute on chronic systolic heart failure ([place]) 02/22/2022   Anemia    Asthma    Atrial fibrillation (HCC)    history of paroxysmal atrial fibrillation   CHF (congestive heart failure) ([place]) 11/02/2021  CHF exacerbation ([place]) 10/17/2021   Diabetes mellitus    DKA, type 2 ([place]) 02/06/2019   Elevated troponin 09/30/2019   Heart failure with reduced ejection fraction ([place])    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  Consider chronic etiology given continued fertility issues   Hypertension    Hypertensive emergency 09/30/2019   Major depressive disorder 02/08/2012   Reports that 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      Migraines    Morbid obesity ([place]) 02/06/2019   Nonischemic cardiomyopathy ([place]) 09/22/2021   NSTEMI (non-ST elevated myocardial infarction) ([place]) 02/23/2022   Obstructive sleep apnea 06/30/2015   Polycystic ovarian disease     Past Surgical History:  Procedure Laterality Date   BIOPSY  11/05/2021   Procedure: BIOPSY;  Surgeon: Daryel November, MD;  Location: [facility] ENDOSCOPY;  Service: Gastroenterology;;   CERVIX LESION DESTRUCTION     CESAREAN SECTION     ESOPHAGOGASTRODUODENOSCOPY (EGD) WITH PROPOFOL N/A 11/05/2021   Procedure: ESOPHAGOGASTRODUODENOSCOPY (EGD) WITH PROPOFOL;  Surgeon: Daryel November, MD;  Location: [facility];  Service: Gastroenterology;  Laterality: N/A;   RIGHT/LEFT HEART CATH AND CORONARY ANGIOGRAPHY N/A 03/29/2019   Procedure: RIGHT/LEFT HEART CATH AND CORONARY ANGIOGRAPHY;  Surgeon: Larey Dresser, MD;  Location: [facility] CV LAB;  Service: Cardiovascular;  Laterality: N/A;   RIGHT/LEFT HEART CATH AND CORONARY ANGIOGRAPHY N/A 09/20/2021   Procedure: RIGHT/LEFT HEART CATH AND CORONARY ANGIOGRAPHY;  Surgeon: Larey Dresser, MD;  Location: [facility] CV LAB;  Service: Cardiovascular;  Laterality: N/A;     Home Medications:  Prior to Admission medications   Medication Sig Start Date End Date Taking? Authorizing Provider  acetaminophen (TYLENOL) 500 MG tablet Take 500-1,000 mg by mouth every 6 (six) hours as needed for mild pain or headache.   Yes [provider]  atorvastatin (LIPITOR) 40 MG tablet TAKE 1 TABLET BY MOUTH DAILY AT 6 PM. Patient taking differently: Take 40 mg by mouth every Monday, Wednesday, and Friday. 04/01/20 08/27/22 Yes Larey Dresser, MD  carvedilol (COREG) 3.125 MG tablet Take 1 tablet (3.125 mg total) by mouth 2 (two) times daily with a meal. 02/27/22  Yes Darrick Meigs, Marge Duncans, MD  empagliflozin (JARDIANCE) 10 MG TABS tablet Take 1 tablet (10 mg total) by mouth daily. 11/08/21  Yes Sharion Settler, DO  furosemide (LASIX) 80 MG tablet  Take 1 tablet (80 mg total) by mouth every morning. 05/09/22  Yes Darci Current, DO  hydrOXYzine (ATARAX) 10 MG tablet Take 1 tablet (10 mg total) by mouth 3 (three) times daily as needed. Patient taking differently: Take 10 mg by mouth 3 (three) times daily as needed for anxiety. 10/22/21  Yes Gladys Damme, MD  insulin aspart (NOVOLOG FLEXPEN) 100 UNIT/ML FlexPen Inject 12 Units into the skin daily before supper. 09/02/21  Yes Hensel, Jamal Collin, MD  insulin glargine (LANTUS) 100 UNIT/ML Solostar Pen Inject 36 Units into the skin every morning. Patient taking differently: Inject 36 Units into the skin in the morning. 09/02/21  Yes Hensel, Jamal Collin, MD  losartan (COZAAR) 25 MG tablet Take 1 tablet (25 mg total) by mouth daily. 02/28/22  Yes Darrick Meigs, Marge Duncans, MD  ondansetron (ZOFRAN-ODT) 4 MG disintegrating tablet Dissolve 1 tablet (4 mg total) by mouth every 8 (eight) hours as needed for nausea or vomiting. 06/06/22  Yes [place] Donnamarie Poag, DO  oxyCODONE (OXY IR/ROXICODONE) 5 MG immediate release tablet Take 1 tablet (5 mg total) by mouth every 6 (six) hours as needed for moderate pain. 06/06/22  Yes [place] Eric J, DO  sertraline (ZOLOFT) 50 MG tablet Take 50 mg by mouth daily.   Yes [provider]  traZODone (DESYREL) 50 MG tablet Take 1 tablet (50 mg total) by mouth at bedtime. 05/09/22  Yes Darci Current, DO  warfarin (COUMADIN) 7.5 MG tablet Take 1-2 tablets (7.5-15 mg total) by mouth daily as directed by [facility] Patient taking differently: Take 7.5-11.25 mg by mouth See admin instructions. Take 11.25 mg by mouth midday on Sun/Tues/Wed/Thurs/Fri/Sat and 7.5 mg on Mon 05/02/22  Yes Branch, Royetta Crochet, MD  Accu-Chek Softclix Lancets lancets Use as instructed 02/12/19   Caroline More, DO  Accu-Chek Softclix Lancets lancets Use up 4 (four) times daily as directed 06/06/22   [place] Donnamarie Poag, DO  acetaminophen (TYLENOL) 325 MG tablet Take 2 tablets (650 mg total) by mouth every 6 (six) hours as  needed for mild pain (or Fever >/= 101). Patient not taking: Reported on 06/25/2022 02/27/22   Oswald Hillock, MD  blood glucose meter kit and supplies Use up to 4 (four) times daily as directed 06/06/22   [place] Donnamarie Poag, DO  glucose blood [facility] VERIO) test strip Use up to 4 (four) times daily as directed 06/06/22   [place] Donnamarie Poag, DO  Insulin Pen Needle (UNIFINE PENTIPS) 31G X 5 MM MISC USE AS DIRECTED 2 TIMES DAILY 09/02/21   Zenia Resides, MD  polyethylene glycol (MIRALAX / GLYCOLAX) 17 g packet Take 17 g by mouth daily as needed for mild constipation. Patient not taking: Reported on 06/25/2022 06/06/22   [place] Donnamarie Poag, DO  senna-docusate (SENOKOT-S) 8.6-50 MG tablet Take 1 tablet by mouth 2 (two) times daily. Patient not taking: Reported on 06/25/2022 06/06/22 09/04/22  [place] Eric J, DO  spironolactone (ALDACTONE) 25 MG tablet Take 0.5 tablets (12.5 mg total) by mouth daily. 11/08/21   Sharion Settler, DO    Inpatient Medications: Scheduled Meds:  atorvastatin  40 mg Oral Daily   carvedilol  3.125 mg Oral BID WC   empagliflozin  10 mg Oral Daily   furosemide  80 mg Intravenous BID   insulin aspart  0-9 Units Subcutaneous TID WC   insulin detemir  15 Units Subcutaneous Daily   losartan  25 mg Oral Daily   metoCLOPramide (REGLAN) injection  10 mg Intravenous Q6H   pantoprazole  40 mg Oral Daily   senna-docusate  1 tablet Oral BID   sertraline  50 mg Oral Daily   spironolactone  12.5 mg Oral Daily   traZODone  50 mg Oral QHS   Warfarin - Pharmacist Dosing Inpatient   Does not apply q1600   Continuous Infusions:  PRN Meds: acetaminophen **OR** acetaminophen, hydrALAZINE, ipratropium-albuterol, metoprolol tartrate, morphine injection, ondansetron (ZOFRAN) IV, polyethylene glycol, senna-docusate  Allergies:    Allergies  Allergen Reactions   Fish Allergy Swelling   Shellfish Allergy Anaphylaxis, Swelling and Other (See Comments)    Airway involvement including EMS - Has  Epi-Pen   Metformin And Related Diarrhea   Trulicity [Dulaglutide] Nausea And Vomiting and Other (See Comments)    Multiple episodes of vomiting over multiple days   Nsaids Other (See Comments)    Per PCP interferes with daily meds (heart failure)   Advil [Ibuprofen] Other (See Comments)    Per PCP interferes with daily meds (heart failure)  Social History:   Social History   Socioeconomic History   Marital status: Single    Spouse name: Not on file   Number of children: Not on file   Years of education: Not on file   Highest education level: Not on file  Occupational History   Not on file  Tobacco Use   Smoking status: Never   Smokeless tobacco: Never   Tobacco comments:    Never smoke 10/22/21  Vaping Use   Vaping Use: Never used  Substance and Sexual Activity   Alcohol use: No    Alcohol/week: 0.0 standard drinks of alcohol   Drug use: No   Sexual activity: Yes  Other Topics Concern   Not on file  Social History Narrative   Not on file   Social Determinants of Health   Financial Resource Strain: High Risk (10/17/2019)   Overall Financial Resource Strain (CARDIA)    Difficulty of Paying Living Expenses: Hard  Food Insecurity: No Food Insecurity (06/25/2022)   Hunger Vital Sign    Worried About Running Out of Food in the Last Year: Never true    Ran Out of Food in the Last Year: Never true  Transportation Needs: Unmet Transportation Needs (06/25/2022)   PRAPARE - Hydrologist (Medical): Yes    Lack of Transportation (Non-Medical): No  Physical Activity: Insufficiently Active (08/19/2020)   Exercise Vital Sign    Days of Exercise per Week: 7 days    Minutes of Exercise per Session: 20 min  Stress: Stress Concern Present (08/19/2020)   [place]    Feeling of Stress : Very much  Social Connections: Not on file  Intimate Partner Violence: Not At Risk (06/25/2022)    Humiliation, Afraid, Rape, and Kick questionnaire    Fear of Current or Ex-Partner: No    Emotionally Abused: No    Physically Abused: No    Sexually Abused: No    Family History:    Family History  Problem Relation Age of Onset   Diabetes Mother    Hypertension Mother    Hypertension Father    Diabetes Father      ROS:  Please see the history of present illness.  General:no colds or fevers, no weight changes Skin:no rashes or ulcers HEENT:no blurred vision, no congestion CV:see HPI PUL:see HPI GI:no diarrhea constipation or melena, no indigestion, + N&V  GU:no hematuria, no dysuria MS:no joint pain, no claudication Neuro:no syncope, no lightheadedness Endo:no diabetes, no thyroid disease  All other ROS reviewed and negative.     Physical Exam/Data:   Vitals:   06/28/22 1348 06/28/22 2008 06/29/22 0601 06/29/22 0805  BP: (!) 135/105 (!) 116/95 (!) 147/116 (!) 149/107  Pulse: 91 85 100 (!) 101  Resp: _0 Temp: 97.9 F (36.6 C) 97.9 F (36.6 C) 97.6 F (36.4 C) 98 F (36.7 C)  TempSrc: Oral Oral Oral Oral  SpO2: 100% 99% 96% 99%  Weight:      Height:        Intake/Output Summary (Last 24 hours) at 06/29/2022 1214 Last data filed at 06/29/2022 0900 Gross per 24 hour  Intake 840 ml  Output --  Net 840 ml      06/24/2022    6:56 PM 06/06/2022    5:00 AM 06/05/2022    6:30 AM  Last 3 Weights  Weight (lbs) 189 lb 180 lb 5.4 oz 176 lb 9.4 oz  Weight (kg) 85.73 kg 81.8 kg 80.1 kg     Body mass index is 38.17 kg/m.   EXAM per Dr. Radford Pax   Relevant CV Studies: Echo   02/22/22   LV thrombus present at apex, 2.2 x 1.4 cm with some mobility. Left ventricular ejection fraction, by estimation, is <20%. The left ventricle has severely decreased function. The left ventricle demonstrates global hypokinesis. The left ventricular internal cavity size was mildly dilated. Left ventricular diastolic parameters are consistent with Grade III diastolic  dysfunction (restrictive). 1. Right ventricular systolic function is moderately reduced. The right ventricular size is normal. There is mildly elevated pulmonary artery systolic pressure. The estimated right ventricular systolic pressure is 72.9 mmHg. 2. 3. Left atrial size was mildly dilated. The mitral valve is normal in structure. Mild mitral valve regurgitation. No evidence of mitral stenosis. 4. 5. Tricuspid valve regurgitation is mild to moderate. The aortic valve is tricuspid. Aortic valve regurgitation is not visualized. No aortic stenosis is present. 6. The inferior vena cava is normal in size with <50% respiratory variability, suggesting right atrial pressure of 8 mmHg.   MRI  09/20/21    IMPRESSION: 1. Mildly dilated LV with LV EF 23%, diffuse hypokinesis with inferior akinesis.   2.  Normal RV size with moderate systolic dysfunction, EF 02%.   3. No myocardial LGE so no definitive evidence for prior MI, infiltrative disease, or myocarditis.   R/L heart cath 08/2021      Mid Cx lesion is 30% stenosed.   Prox RCA to Mid RCA lesion is 25% stenosed.   1. Mild, nonobstructive CAD. Nonischemic cardiomyopathy.  2. Elevated right and left heart filling pressures.  3. Preserved cardiac output.  4. PAPI low at 1.5.  5. Pulmonary venous hypertension.     Laboratory Data:  High Sensitivity Troponin:   Recent Labs  Lab 06/02/22 1613 06/02/22 2136 06/24/22 1848 06/24/22 2025  TROPONINIHS 38* 31* 99* 87*     Chemistry Recent Labs  Lab 06/28/22 0548 06/29/22 0648 06/29/22 1015  NA 132* 135 134*  K 4.0 3.8 4.0  CL 98 98 97*  CO2 _0 GLUCOSE 167* 111* 207*  BUN 24* 22* 22*  CREATININE 1.04* 0.88 1.09*  CALCIUM 8.6* 8.7* 8.4*  GFRNONAA >60 >60 >60  ANIONGAP _1 Recent Labs  Lab 06/24/22 1848 06/26/22 0537 06/27/22 0425  PROT 8.1 6.9 7.2  ALBUMIN 3.1* 2.6* 2.7*  AST 23 31 55*  ALT _2 ALKPHOS 151* 144* 165*  BILITOT 2.8* 2.3*  1.8*   Lipids No results for input(s): "CHOL", "TRIG", "HDL", "LABVLDL", "LDLCALC", "CHOLHDL" in the last 168 hours.  Hematology Recent Labs  Lab 06/26/22 0537 06/29/22 0648 06/29/22 1015  WBC 7.1 6.6 6.2  RBC 4.90 5.27* 5.15*  HGB 10.2* 10.7* 10.7*  HCT 33.8* 35.3* 34.6*  MCV 69.0* 67.0* 67.2*  MCH 20.8* 20.3* 20.8*  MCHC 30.2 30.3 30.9  RDW 20.7* 21.4* 21.2*  PLT 252 303 291   Thyroid  Recent Labs  Lab 06/25/22 0642  TSH 1.887    BNPNo results for input(s): "BNP", "PROBNP" in the last 168 hours.  DDimer No results for input(s): "DDIMER" in the last 168 hours.   Radiology/Studies:  No results found.   Assessment and Plan:   Acute on chronic systolic and diastolic CHF,  she is + 1115 if accurate this admit.  Had been on her lasix 80 every AM but now on lasix 80  mg BID. Monitor I&O and wts.  Keep K+ 4.0 and Mg 2.0 monitor cr.  Pt needs to take medications regularly.  Elevated troponin with no significant CAD demand ischemia related to HTN and vomiting. DM/anemia/N&V.OSA. cpap per IM LV thrombus on coumadin followed by pharmacy    Risk Assessment/Risk Scores:        New York Heart Association (NYHA) Functional Class NYHA Class III        For questions or updates, please contact [place] Please consult [email or web address] for contact info under    Signed, Cecilie Kicks, NP  06/29/2022 12:14 PM

## 2022-06-29 NOTE — Progress Notes (Signed)
[facility] for Warfarin Indication: atrial fibrillation, LV thrombus  Allergies  Allergen Reactions   Fish Allergy Swelling   Shellfish Allergy Anaphylaxis, Swelling and Other (See Comments)    Airway involvement including EMS - Has Epi-Pen   Metformin And Related Diarrhea   Trulicity [Dulaglutide] Nausea And Vomiting and Other (See Comments)    Multiple episodes of vomiting over multiple days   Nsaids Other (See Comments)    Per PCP interferes with daily meds (heart failure)   Advil [Ibuprofen] Other (See Comments)    Per PCP interferes with daily meds (heart failure)    Patient Measurements: Height: '4\' 11"'$  (149.9 cm) Weight: 85.7 kg (189 lb) IBW/kg (Calculated) : 43.2  Vital Signs: Temp: 98 F (36.7 C) (12/06 0805) Temp Source: Oral (12/06 0805) BP: 149/107 (12/06 0805) Pulse Rate: 101 (12/06 0805)  Labs: Recent Labs    06/27/22 0425 06/28/22 0548 06/29/22 0648 06/29/22 1015  HGB  --   --  10.7*  --   HCT  --   --  35.3*  --   PLT  --   --  303  --   LABPROT 34.9* 39.1* 36.6*  --   INR 3.5* 4.1* 3.7*  --   CREATININE 1.22* 1.04* 0.88 1.09*     Estimated Creatinine Clearance: 62.6 mL/min (A) (by C-G formula based on SCr of 1.09 mg/dL (H)).  Medications:  Scheduled:   atorvastatin  40 mg Oral Daily   carvedilol  3.125 mg Oral BID WC   empagliflozin  10 mg Oral Daily   furosemide  80 mg Intravenous BID   insulin aspart  0-9 Units Subcutaneous TID WC   insulin detemir  15 Units Subcutaneous Daily   losartan  25 mg Oral Daily   metoCLOPramide (REGLAN) injection  10 mg Intravenous Q6H   pantoprazole  40 mg Oral Daily   senna-docusate  1 tablet Oral BID   sertraline  50 mg Oral Daily   spironolactone  12.5 mg Oral Daily   traZODone  50 mg Oral QHS   Warfarin - Pharmacist Dosing Inpatient   Does not apply q1600   Infusions:  PRN: acetaminophen **OR** acetaminophen, hydrALAZINE, ipratropium-albuterol, metoprolol tartrate,  morphine injection, ondansetron (ZOFRAN) IV, polyethylene glycol, senna-docusate  Assessment: 46 yo female on chronic warfarin for afib, LV thrombus presents to ED with chest pain, nausea and vomiting. Pharmacy consulted to continue dosing warfarin while inpatient.  Home warfarin regimen reported as 11.'25mg'$  daily except 7.'5mg'$  on Mondays - last taken 11/29  Baseline INR therapeutic Prior anticoagulation: warfarin 11.25 mg daily except 7.5 mg on Monday; LD 11/29  Significant events:  Today, 06/29/2022: CBC: Hgb slightly low but c/w values on admission; Plt stable WNL INR supratherapeutic but improved from yesterday after holding warfarin dose Major drug interactions: none noted No bleeding issues per nursing Eating 10-50% of meals; poor appetite despite improvement in nausea  Goal of Therapy: INR 2-3  Plan: Hold warfarin again today as PO intake still poor Daily INR CBC at least q72 hr while on warfarin Monitor for signs of bleeding or thrombosis Not yet ready to resume PTA warfarin dosing d/t reduced PO intake   Reuel Boom, PharmD, BCPS [phone] 06/29/2022, 11:00 AM

## 2022-06-29 NOTE — Inpatient Diabetes Management (Signed)
Inpatient Diabetes Program Recommendations  AACE/ADA: New Consensus Statement on Inpatient Glycemic Control (2015)  Target Ranges:  Prepandial:   less than 140 mg/dL      Peak postprandial:   less than 180 mg/dL (1-2 hours)      Critically ill patients:  140 - 180 mg/dL   Lab Results  Component Value Date   GLUCAP 102 (H) 06/29/2022   HGBA1C >15.5 (H) 06/02/2022    Review of Glycemic Control  Diabetes history: DM2 Outpatient Diabetes medications: Lantus 36 units QAM, Novolog 12 units ac supper, Jardiance 10 mg QD Current orders for Inpatient glycemic control: Levemir 15 units QD, Novolog 0-9 units TID, Jardiance 10 mg QD  HgbA1 - > 15.5% CBGs 111, 102 this am  Inpatient Diabetes Program Recommendations:    Agree with decrease in Levemir d/t hypoglycemia in am.  Home dose for Levemir will need to be decreased to 15 units QD. Pt will need to check blood sugars more frequently and f/u with PCP/Endo regarding labile blood sugars with hypoglycemia.   Will check with pt for any questions/concerns.   Thank you. Lorenda Peck, RD, LDN, [facility] Inpatient Diabetes Coordinator [phone]

## 2022-06-30 ENCOUNTER — Other Ambulatory Visit (HOSPITAL_COMMUNITY): Payer: Self-pay

## 2022-06-30 DIAGNOSIS — R112 Nausea with vomiting, unspecified: Secondary | ICD-10-CM | POA: Diagnosis not present

## 2022-06-30 LAB — CBC
HCT: 38.3 % (ref 36.0–46.0)
Hemoglobin: 11.6 g/dL — ABNORMAL LOW (ref 12.0–15.0)
MCH: 20.4 pg — ABNORMAL LOW (ref 26.0–34.0)
MCHC: 30.3 g/dL (ref 30.0–36.0)
MCV: 67.2 fL — ABNORMAL LOW (ref 80.0–100.0)
Platelets: 289 10*3/uL (ref 150–400)
RBC: 5.7 MIL/uL — ABNORMAL HIGH (ref 3.87–5.11)
RDW: 21.8 % — ABNORMAL HIGH (ref 11.5–15.5)
WBC: 8.4 10*3/uL (ref 4.0–10.5)
nRBC: 0.2 % (ref 0.0–0.2)

## 2022-06-30 LAB — BASIC METABOLIC PANEL
Anion gap: 10 (ref 5–15)
BUN: 18 mg/dL (ref 6–20)
CO2: 28 mmol/L (ref 22–32)
Calcium: 8.6 mg/dL — ABNORMAL LOW (ref 8.9–10.3)
Chloride: 98 mmol/L (ref 98–111)
Creatinine, Ser: 1.14 mg/dL — ABNORMAL HIGH (ref 0.44–1.00)
GFR, Estimated: >60 mL/min (ref >60)
Glucose, Bld: 160 mg/dL — ABNORMAL HIGH (ref 70–99)
Potassium: 3.8 mmol/L (ref 3.5–5.1)
Sodium: 136 mmol/L (ref 135–145)

## 2022-06-30 LAB — GLUCOSE, CAPILLARY
Glucose-Capillary: 145 mg/dL — ABNORMAL HIGH (ref 70–99)
Glucose-Capillary: 161 mg/dL — ABNORMAL HIGH (ref 70–99)
Glucose-Capillary: 218 mg/dL — ABNORMAL HIGH (ref 70–99)
Glucose-Capillary: 251 mg/dL — ABNORMAL HIGH (ref 70–99)
Glucose-Capillary: 115 mg/dL — ABNORMAL HIGH (ref 70–99)

## 2022-06-30 LAB — BRAIN NATRIURETIC PEPTIDE: B Natriuretic Peptide: 400.2 pg/mL — ABNORMAL HIGH (ref 0.0–100.0)

## 2022-06-30 LAB — PROTIME-INR
INR: 2.6 — ABNORMAL HIGH (ref 0.8–1.2)
Prothrombin Time: 27.6 seconds — ABNORMAL HIGH (ref 11.4–15.2)

## 2022-06-30 LAB — FOLATE: Folate: 8.6 ng/mL (ref >5.9)

## 2022-06-30 LAB — MAGNESIUM: Magnesium: 1.7 mg/dL (ref 1.7–2.4)

## 2022-06-30 MED ORDER — WARFARIN SODIUM 5 MG PO TABS
7.5000 mg | ORAL_TABLET | Freq: Once | ORAL | Status: AC
  Administered 2022-06-30: 7.5 mg via ORAL
  Filled 2022-06-30: qty 1

## 2022-06-30 MED ORDER — FERROUS SULFATE 325 (65 FE) MG PO TABS
325.0000 mg | ORAL_TABLET | Freq: Every day | ORAL | Status: DC
  Administered 2022-07-01: 325 mg via ORAL
  Filled 2022-06-30: qty 1

## 2022-06-30 MED ORDER — INSULIN ASPART 100 UNIT/ML IJ SOLN
0.0000 [IU] | Freq: Three times a day (TID) | INTRAMUSCULAR | Status: DC
  Administered 2022-06-30: 8 [IU] via SUBCUTANEOUS
  Administered 2022-07-01: 2 [IU] via SUBCUTANEOUS

## 2022-06-30 MED ORDER — INSULIN DETEMIR 100 UNIT/ML ~~LOC~~ SOLN
10.0000 [IU] | Freq: Every day | SUBCUTANEOUS | Status: DC
  Filled 2022-06-30: qty 0.1

## 2022-06-30 NOTE — Plan of Care (Signed)
Nutrition Education Note  RD consulted for nutrition education regarding CHF/low sodium diet education, comment of "patient eats out a lot" . Patient also noted to have diabetes with HA1C as below and uncontrolled blood sugars.  Lab Results  Component Value Date   HGBA1C >15.5 (H) 06/02/2022     RD provided "Low Sodium Nutrition Therapy" handout from the Academy of Nutrition and Dietetics. Reviewed patient's dietary recall. Provided examples on ways to decrease sodium intake in diet. Discouraged intake of processed foods and use of salt shaker. Encouraged fresh fruits and vegetables as well as whole grain sources of carbohydrates to maximize fiber intake.   RD discussed why it is important for patient to adhere to diet recommendations, and emphasized the role of fluids, foods to avoid, and importance of weighing self daily.   RD provided "Carbohydrate Counting for People with Diabetes" handout from the Academy of Nutrition and Dietetics. Discussed different food groups and their effects on blood sugar, emphasizing carbohydrate-containing foods. Provided list of carbohydrates containing foods.  Discussed importance of controlled and consistent carbohydrate intake throughout the day. Provided examples of ways to balance meals/snacks and encouraged intake of high-fiber, whole grain complex carbohydrates. Teach back method used.  Expect fair compliance.  Body mass index is 38.17 kg/m. Pt meets criteria for Obesity II based on current BMI.  Current diet order is Heart Healthy/Carb Modified with 1217m fluid restriction, patient is consuming an average of approximately 52% of meals at this time. Labs and medications reviewed. No further nutrition interventions warranted at this time. RD contact information provided. If additional nutrition issues arise, please re-consult RD.   ASamson FredericRD, LDN For contact information, refer to [facility]

## 2022-06-30 NOTE — Progress Notes (Signed)
PROGRESS NOTE    [name]  DJT:[id number] DOB: [birth year] DOA: 06/24/2022 PCP: Donney Dice, DO   Brief Narrative:   46 y.o. female with medical history significant of abnormal Pap smear, chronic combined systolic and diastolic heart failure, normocytic anemia, asthma, left ventricle thrombus, paroxysmal atrial fibrillation on warfarin, CAD, history NSTEMI, chronic elevated troponin, type 2 diabetes, history of episodes of DKA, hydrosalpinx, polycystic ovarian disease, hypertension, episodes of hypertensive emergency, depression, migraine headaches, obesity currently class II obesity with a BMI of 38.17 kg/m, obstructive sleep apnea not on CPAP who presented to the emergency department due to chest pain, nausea and vomiting for a month.  She was seen by GI (Dr. Candis Schatz) back in April with workup showing gastritis and no H. pylori.  Upon admission patient was found to be in fluid overload requiring IV Lasix, cardiology team was consulted.   Assessment & Plan:  Principal Problem:   Nausea and vomiting Active Problems:   Poorly controlled type 2 diabetes mellitus (HCC)   Anemia of chronic disease   Obstructive sleep apnea   Hyperlipidemia associated with type 2 diabetes mellitus (HCC)   Chronic combined systolic and diastolic heart failure (HCC)   Paroxysmal atrial fibrillation (HCC)   Chronic chest wall pain   History of left ventricular mural thrombus   Hypertensive urgency   Mild protein malnutrition (HCC)   Class 2 obesity   Hyperbilirubinemia   Nausea & vomiting    Nausea, vomiting; improved.  -I suspect from intestinal wall edema.  Right upper quadrant ultrasound is negative.  Supportive care, antiemetics as needed.  This could certainly be related to gastroparesis, getting Reglan. -Lipase and LFTs are overall normal   Diabetes mellitus type 2, poorly controlled with hyperglycemia and hypoglycemia -A1c > 15.5.  Currently on Levemir 15 units daily, sliding scale and  Accu-Cheks. -zSeen by diabetic coordinator   Acute on chronic combined systolic diastolic heart failure -EF <30%, grade 3 diastolic dysfunction.  Currently on Coreg, Cozaar, Aldactone.  Resume home Jardiance. Strict input and output.  Monitor and replete electrolytes as necessary - Getting IV Lasix.  Cardiology is following  Anemia of chronic disease, microcytosis - Hemoglobin appears to be stable around 10.  Iron studies shows iron deficiency, will start IV iron while patient is here   Demand ischemia -Troponin 99, 87.  Remains chest pain-free   Hyperlipidemia -Lipitor   History left ventricular mural thrombus -On Coumadin being managed by pharmacy team.   OSA -CPAP nightly     DVT prophylaxis: On Coumadin Code Status: Full code Family Communication:    Status is: Inpatient Continue hospital stay for aggressive IV diuresis    Subjective: Nausea is better. SOB is slowly improving.    Examination:  Constitutional: Not in acute distress Respiratory: bibasilar crackles.  Cardiovascular: Normal sinus rhythm, no rubs Abdomen: Nontender nondistended good bowel sounds Musculoskeletal: No edema noted Skin: No rashes seen Neurologic: CN 2-12 grossly intact.  And nonfocal Psychiatric: Normal judgment and insight. Alert and oriented x 3. Normal mood.     Objective: Vitals:   06/29/22 2103 06/29/22 2136 06/30/22 0322 06/30/22 0744  BP: (!) 151/108 (!) 142/104 (!) 131/99 (!) 139/91  Pulse: 88 90 91 99  Resp: 18   18  Temp: 98.1 F (36.7 C)  98 F (36.7 C)   TempSrc: Oral  Oral   SpO2: 100% 99% 99% 100%  Weight:      Height:        Intake/Output Summary (Last 24  hours) at 06/30/2022 1136 Last data filed at 06/30/2022 0326 Gross per 24 hour  Intake 510 ml  Output 1000 ml  Net -490 ml   Filed Weights   06/24/22 1856  Weight: 85.7 kg     Data Reviewed:   CBC: Recent Labs  Lab 06/24/22 1848 06/26/22 0537 06/29/22 0648 06/29/22 1015  WBC 8.5 7.1 6.6  6.2  NEUTROABS 5.2  --   --   --   HGB 11.3* 10.2* 10.7* 10.7*  HCT 36.4 33.8* 35.3* 34.6*  MCV 69.2* 69.0* 67.0* 67.2*  PLT 301 252 303 283   Basic Metabolic Panel: Recent Labs  Lab 06/27/22 0425 06/28/22 0548 06/29/22 0648 06/29/22 1015 06/30/22 0705 06/30/22 0755  NA 133* 132* 135 134*  --  136  K 4.2 4.0 3.8 4.0  --  3.8  CL 102 98 98 97*  --  98  CO2 20* '25 27 27  '$ --  28  GLUCOSE 64* 167* 111* 207*  --  160*  BUN 22* 24* 22* 22*  --  18  CREATININE 1.22* 1.04* 0.88 1.09*  --  1.14*  CALCIUM 8.4* 8.6* 8.7* 8.4*  --  8.6*  MG  --   --   --   --  1.7  --    GFR: Estimated Creatinine Clearance: 59.8 mL/min (A) (by C-G formula based on SCr of 1.14 mg/dL (H)). Liver Function Tests: Recent Labs  Lab 06/24/22 1848 06/26/22 0537 06/27/22 0425  AST 23 31 55*  ALT '14 13 16  '$ ALKPHOS 151* 144* 165*  BILITOT 2.8* 2.3* 1.8*  PROT 8.1 6.9 7.2  ALBUMIN 3.1* 2.6* 2.7*   Recent Labs  Lab 06/24/22 1848  LIPASE 24   No results for input(s): "AMMONIA" in the last 168 hours. Coagulation Profile: Recent Labs  Lab 06/26/22 0537 06/27/22 0425 06/28/22 0548 06/29/22 0648 06/30/22 0705  INR 2.8* 3.5* 4.1* 3.7* 2.6*   Cardiac Enzymes: No results for input(s): "CKTOTAL", "CKMB", "CKMBINDEX", "TROPONINI" in the last 168 hours. BNP (last 3 results) No results for input(s): "PROBNP" in the last 8760 hours. HbA1C: No results for input(s): "HGBA1C" in the last 72 hours. CBG: Recent Labs  Lab 06/29/22 1110 06/29/22 1625 06/29/22 2105 06/30/22 0300 06/30/22 0731  GLUCAP 195* 238* 202* 145* 161*   Lipid Profile: No results for input(s): "CHOL", "HDL", "LDLCALC", "TRIG", "CHOLHDL", "LDLDIRECT" in the last 72 hours. Thyroid Function Tests: No results for input(s): "TSH", "T4TOTAL", "FREET4", "T3FREE", "THYROIDAB" in the last 72 hours. Anemia Panel: Recent Labs    06/29/22 1015 06/30/22 0705  VITAMINB12 1,470*  --   FOLATE  --  8.6  FERRITIN 46  --   TIBC 392  --    IRON 25*  --    Sepsis Labs: No results for input(s): "PROCALCITON", "LATICACIDVEN" in the last 168 hours.  Recent Results (from the past 240 hour(s))  Resp Panel by RT-PCR (Flu A&B, Covid) Anterior Nasal Swab     Status: None   Collection Time: 06/24/22  6:33 PM   Specimen: Anterior Nasal Swab  Result Value Ref Range Status   SARS Coronavirus 2 by RT PCR NEGATIVE NEGATIVE Final    Comment: (NOTE) SARS-CoV-2 target nucleic acids are NOT DETECTED.  The SARS-CoV-2 RNA is generally detectable in upper respiratory specimens during the acute phase of infection. The lowest concentration of SARS-CoV-2 viral copies this assay can detect is 138 copies/mL. A negative result does not preclude SARS-Cov-2 infection and should not be used as the  sole basis for treatment or other patient management decisions. A negative result may occur with  improper specimen collection/handling, submission of specimen other than nasopharyngeal swab, presence of viral mutation(s) within the areas targeted by this assay, and inadequate number of viral copies(<138 copies/mL). A negative result must be combined with clinical observations, patient history, and epidemiological information. The expected result is Negative.  Fact Sheet for Patients:  EntrepreneurPulse.com.au  Fact Sheet for Healthcare Providers:  IncredibleEmployment.be  This test is no t yet approved or cleared by the [place] FDA and  has been authorized for detection and/or diagnosis of SARS-CoV-2 by FDA under an Emergency Use Authorization (EUA). This EUA will remain  in effect (meaning this test can be used) for the duration of the COVID-19 declaration under Section 564(b)(1) of the Act, 21 U.S.C.section 360bbb-3(b)(1), unless the authorization is terminated  or revoked sooner.       Influenza A by PCR NEGATIVE NEGATIVE Final   Influenza B by PCR NEGATIVE NEGATIVE Final    Comment: (NOTE) The  Xpert Xpress SARS-CoV-2/FLU/RSV plus assay is intended as an aid in the diagnosis of influenza from Nasopharyngeal swab specimens and should not be used as a sole basis for treatment. Nasal washings and aspirates are unacceptable for Xpert Xpress SARS-CoV-2/FLU/RSV testing.  Fact Sheet for Patients: EntrepreneurPulse.com.au  Fact Sheet for Healthcare Providers: IncredibleEmployment.be  This test is not yet approved or cleared by the [place] FDA and has been authorized for detection and/or diagnosis of SARS-CoV-2 by FDA under an Emergency Use Authorization (EUA). This EUA will remain in effect (meaning this test can be used) for the duration of the COVID-19 declaration under Section 564(b)(1) of the Act, 21 U.S.C. section 360bbb-3(b)(1), unless the authorization is terminated or revoked.  Performed at [facility], [place] [address], [place], [place] [address]          Radiology Studies: No results found.      Scheduled Meds:  atorvastatin  40 mg Oral Daily   carvedilol  3.125 mg Oral BID WC   empagliflozin  10 mg Oral Daily   furosemide  80 mg Intravenous BID   insulin aspart  0-9 Units Subcutaneous TID WC   insulin detemir  15 Units Subcutaneous Daily   losartan  25 mg Oral Daily   metoCLOPramide (REGLAN) injection  10 mg Intravenous Q6H   pantoprazole  40 mg Oral Daily   senna-docusate  1 tablet Oral BID   sertraline  50 mg Oral Daily   spironolactone  12.5 mg Oral Daily   traZODone  50 mg Oral QHS   Warfarin - Pharmacist Dosing Inpatient   Does not apply q1600   Continuous Infusions:  ferric gluconate (FERRLECIT) IVPB 250 mg (06/30/22 1023)     LOS: 4 days   Time spent= 35 mins    Tammy Warbington Arsenio Loader, MD [facility]  If 7PM-7AM, please contact night-coverage  06/30/2022, 11:36 AM

## 2022-06-30 NOTE — Progress Notes (Signed)
Received call from central cardiac monitoring at 20:32 & was informed of patient having 8 beat run of PVC's around 19:00. Patient is in no distress.

## 2022-06-30 NOTE — Plan of Care (Signed)
  Problem: Fluid Volume: Goal: Ability to maintain a balanced intake and output will improve Outcome: Progressing   Problem: Metabolic: Goal: Ability to maintain appropriate glucose levels will improve Outcome: Progressing

## 2022-06-30 NOTE — Progress Notes (Signed)
Rounding Note    Patient Name: [name] Date of Encounter: 06/30/2022  [place] Cardiologist: Loralie Champagne, MD   Subjective   No chest pain  Inpatient Medications    Scheduled Meds:  atorvastatin  40 mg Oral Daily   carvedilol  3.125 mg Oral BID WC   empagliflozin  10 mg Oral Daily   furosemide  80 mg Intravenous BID   insulin aspart  0-9 Units Subcutaneous TID WC   insulin detemir  15 Units Subcutaneous Daily   losartan  25 mg Oral Daily   metoCLOPramide (REGLAN) injection  10 mg Intravenous Q6H   pantoprazole  40 mg Oral Daily   senna-docusate  1 tablet Oral BID   sertraline  50 mg Oral Daily   spironolactone  12.5 mg Oral Daily   traZODone  50 mg Oral QHS   warfarin  7.5 mg Oral ONCE-1600   Warfarin - Pharmacist Dosing Inpatient   Does not apply q1600   Continuous Infusions:  ferric gluconate (FERRLECIT) IVPB 250 mg (06/30/22 1023)   PRN Meds: acetaminophen **OR** acetaminophen, hydrALAZINE, ipratropium-albuterol, metoprolol tartrate, morphine injection, ondansetron (ZOFRAN) IV, polyethylene glycol, senna-docusate   Vital Signs    Vitals:   06/29/22 2103 06/29/22 2136 06/30/22 0322 06/30/22 0744  BP: (!) 151/108 (!) 142/104 (!) 131/99 (!) 139/91  Pulse: 88 90 91 99  Resp: 18   18  Temp: 98.1 F (36.7 C)  98 F (36.7 C)   TempSrc: Oral  Oral   SpO2: 100% 99% 99% 100%  Weight:      Height:        Intake/Output Summary (Last 24 hours) at 06/30/2022 1254 Last data filed at 06/30/2022 0326 Gross per 24 hour  Intake 510 ml  Output 1000 ml  Net -490 ml      06/24/2022    6:56 PM 06/06/2022    5:00 AM 06/05/2022    6:30 AM  Last 3 Weights  Weight (lbs) 189 lb 180 lb 5.4 oz 176 lb 9.4 oz  Weight (kg) 85.73 kg 81.8 kg 80.1 kg      Telemetry    SR  Burst SVT - Personally Reviewed  ECG    NSVT  - Personally Reviewed  Physical Exam  Exam per Dr. Radford Pax GEN: No acute distress.   Neck: No JVD Cardiac: RRR, no murmurs, rubs,  or gallops.  Respiratory: Clear to auscultation bilaterally. GI: Soft, nontender, non-distended  MS: No edema; No deformity. Neuro:  Nonfocal  Psych: Normal affect   Labs    High Sensitivity Troponin:   Recent Labs  Lab 06/02/22 1613 06/02/22 2136 06/24/22 1848 06/24/22 2025  TROPONINIHS 38* 31* 99* 87*     Chemistry Recent Labs  Lab 06/24/22 1848 06/25/22 8676 06/26/22 0537 06/27/22 0425 06/28/22 0548 06/29/22 0648 06/29/22 1015 06/30/22 0705 06/30/22 0755  NA 124*   < > 134* 133*   < > 135 134*  --  136  K 3.6   < > 3.6 4.2   < > 3.8 4.0  --  3.8  CL 91*   < > 103 102   < > 98 97*  --  98  CO2 20*   < > 22 20*   < > 27 27  --  28  GLUCOSE 490*   < > 195* 64*   < > 111* 207*  --  160*  BUN 14   < > 18 22*   < > 22* 22*  --  18  CREATININE 1.09*   < > 1.16* 1.22*   < > 0.88 1.09*  --  1.14*  CALCIUM 8.4*   < > 8.5* 8.4*   < > 8.7* 8.4*  --  8.6*  MG  --   --   --   --   --   --   --  1.7  --   PROT 8.1  --  6.9 7.2  --   --   --   --   --   ALBUMIN 3.1*  --  2.6* 2.7*  --   --   --   --   --   AST 23  --  31 55*  --   --   --   --   --   ALT 14  --  13 16  --   --   --   --   --   ALKPHOS 151*  --  144* 165*  --   --   --   --   --   BILITOT 2.8*  --  2.3* 1.8*  --   --   --   --   --   GFRNONAA >60   < > 60* 56*   < > >60 >60  --  >60  ANIONGAP 13   < > 9 11   < > 10 10  --  10   < > = values in this interval not displayed.    Lipids No results for input(s): "CHOL", "TRIG", "HDL", "LABVLDL", "LDLCALC", "CHOLHDL" in the last 168 hours.  Hematology Recent Labs  Lab 06/26/22 0537 06/29/22 0648 06/29/22 1015  WBC 7.1 6.6 6.2  RBC 4.90 5.27* 5.15*  HGB 10.2* 10.7* 10.7*  HCT 33.8* 35.3* 34.6*  MCV 69.0* 67.0* 67.2*  MCH 20.8* 20.3* 20.8*  MCHC 30.2 30.3 30.9  RDW 20.7* 21.4* 21.2*  PLT 252 303 291   Thyroid  Recent Labs  Lab 06/25/22 0642  TSH 1.887    BNP Recent Labs  Lab 06/30/22 0705  BNP 400.2*    DDimer No results for input(s): "DDIMER"  in the last 168 hours.   Radiology    No results found.  Cardiac Studies   Echo   02/22/22   LV thrombus present at apex, 2.2 x 1.4 cm with some mobility. Left ventricular ejection fraction, by estimation, is <20%. The left ventricle has severely decreased function. The left ventricle demonstrates global hypokinesis. The left ventricular internal cavity size was mildly dilated. Left ventricular diastolic parameters are consistent with Grade III diastolic dysfunction (restrictive). 1. Right ventricular systolic function is moderately reduced. The right ventricular size is normal. There is mildly elevated pulmonary artery systolic pressure. The estimated right ventricular systolic pressure is 93.8 mmHg. 2. 3. Left atrial size was mildly dilated. The mitral valve is normal in structure. Mild mitral valve regurgitation. No evidence of mitral stenosis. 4. 5. Tricuspid valve regurgitation is mild to moderate. The aortic valve is tricuspid. Aortic valve regurgitation is not visualized. No aortic stenosis is present. 6. The inferior vena cava is normal in size with <50% respiratory variability, suggesting right atrial pressure of 8 mmHg.   MRI  09/20/21    IMPRESSION: 1. Mildly dilated LV with LV EF 23%, diffuse hypokinesis with inferior akinesis.   2.  Normal RV size with moderate systolic dysfunction, EF 10%.   3. No myocardial LGE so no definitive evidence for prior MI, infiltrative disease, or myocarditis.   R/L heart  cath 08/2021      Mid Cx lesion is 30% stenosed.   Prox RCA to Mid RCA lesion is 25% stenosed.   1. Mild, nonobstructive CAD. Nonischemic cardiomyopathy.  2. Elevated right and left heart filling pressures.  3. Preserved cardiac output.  4. PAPI low at 1.5.  5. Pulmonary venous hypertension.   Patient Profile     46 y.o. female  hx of chronic systolic and diastolic CHF EF 02-54%, NICM, PAF on xarelto, DM-2, anemia, LV thrombus, HTN now admitted 06/24/22  with N&V,  hyperbilirubinemia and CHF.   Assessment & Plan    ASSESSMENT/PLAN:   #Acute on chronic combined systolic/diastolic CHF -she has had multiple admissions for CHF exacerbations related to medical and dietary noncompliance>>admits to eating out a lot -admitted with N/V felt related to bowel wall edema from acute CHF -BNP elevated at 758 but likely underestimated in setting of obesity -Started on Lasix '80mg'$  IV BID but I&O's are incomplete -weight up 9lbs from last admission -SCr stable at 1.09 and K+ 4 -continue IV lasix as she remains volume overloaded -follow strict I&O's, daily weights and renal function  she is + 2,195 cc,  she is getting increased IV fluids with Iron -stressed importance of being compliant with meds -will get nutrition consult to help educate about eating out and high Na content -continue GDMT with Carvedilol 3.'125mg'$  BID, Jardiance '10mg'$ d aily, Losartan '25mg'$  daily and spiro 12.'5mg'$  daily>>continue to uptitrate to reach max tolerated GDMT   #Elevated troponin -hsTrop elevated with flat trend at 99>>87 -complains of atypical CP that is sharp and stabbing and no c/w ACS -had cath 08/2021 with mild nonobstructive dz -no further ischemic workup indicated at this time   #LV thrombus -noted on echo back in 2020 and was on coumadin but then changed to Xarelto after cMRI showed no further evidence of thrombus in 08/2021 -recurrent LV thrombus on echo 8/23 and now on Coumadin -INR therapeutic   #ASCAD -mild nonobstructive dz on cath 08/2021 -continue Atorvastatin '40mg'$  daily, Carvedilol 2.'125mg'$  BID -no ASA due to DOAC   #HLD -LDL goal < 70 -LDL 160 in 08/2021 -repeat FLP in am -continue Atorvastatin '40mg'$  daily   #HTN -BP poorly controlled due to medical and dietary noncompliance -restarted on home meds with Losartan, Carvedilol, spiro  -will get case management involved to see if their are financial barriers to getting her meds         For questions or  updates, please contact [place] Please consult [email or web address] for contact info under        Signed, Cecilie Kicks, NP  06/30/2022, 12:54 PM

## 2022-06-30 NOTE — Progress Notes (Signed)
[place] for Warfarin Indication: atrial fibrillation, LV thrombus  Allergies  Allergen Reactions   Fish Allergy Swelling   Shellfish Allergy Anaphylaxis, Swelling and Other (See Comments)    Airway involvement including EMS - Has Epi-Pen   Metformin And Related Diarrhea   Trulicity [Dulaglutide] Nausea And Vomiting and Other (See Comments)    Multiple episodes of vomiting over multiple days   Nsaids Other (See Comments)    Per PCP interferes with daily meds (heart failure)   Advil [Ibuprofen] Other (See Comments)    Per PCP interferes with daily meds (heart failure)    Patient Measurements: Height: '4\' 11"'$  (149.9 cm) Weight: 85.7 kg (189 lb) IBW/kg (Calculated) : 43.2  Vital Signs: Temp: 98 F (36.7 C) (12/07 0322) Temp Source: Oral (12/07 0322) BP: 139/91 (12/07 0744) Pulse Rate: 99 (12/07 0744)  Labs: Recent Labs    06/28/22 0548 06/29/22 0648 06/29/22 1015 06/30/22 0705 06/30/22 0755  HGB  --  10.7* 10.7*  --   --   HCT  --  35.3* 34.6*  --   --   PLT  --  303 291  --   --   LABPROT 39.1* 36.6*  --  27.6*  --   INR 4.1* 3.7*  --  2.6*  --   CREATININE 1.04* 0.88 1.09*  --  1.14*     Estimated Creatinine Clearance: 59.8 mL/min (A) (by C-G formula based on SCr of 1.14 mg/dL (H)).  Medications:  Scheduled:   atorvastatin  40 mg Oral Daily   carvedilol  3.125 mg Oral BID WC   empagliflozin  10 mg Oral Daily   furosemide  80 mg Intravenous BID   insulin aspart  0-9 Units Subcutaneous TID WC   insulin detemir  15 Units Subcutaneous Daily   losartan  25 mg Oral Daily   metoCLOPramide (REGLAN) injection  10 mg Intravenous Q6H   pantoprazole  40 mg Oral Daily   senna-docusate  1 tablet Oral BID   sertraline  50 mg Oral Daily   spironolactone  12.5 mg Oral Daily   traZODone  50 mg Oral QHS   Warfarin - Pharmacist Dosing Inpatient   Does not apply q1600   Infusions:   ferric gluconate (FERRLECIT) IVPB 250 mg (06/30/22  1023)   PRN: acetaminophen **OR** acetaminophen, hydrALAZINE, ipratropium-albuterol, metoprolol tartrate, morphine injection, ondansetron (ZOFRAN) IV, polyethylene glycol, senna-docusate  Assessment: 46 yo female on chronic warfarin for afib, LV thrombus presents to ED with chest pain, nausea and vomiting. Pharmacy consulted to continue dosing warfarin while inpatient.  Home warfarin regimen reported as 11.'25mg'$  daily except 7.'5mg'$  on Mondays - last taken 11/29  Baseline INR therapeutic Prior anticoagulation: warfarin 11.25 mg daily except 7.5 mg on Monday; LD 11/29  Significant events:  Today, 06/30/2022: CBC (12/6):  Hgb slightly low but c/w values on admission; Plt stable WNL INR now decreased to therapeutic range after holding warfarin x 2 Major drug interactions: none noted No bleeding issues per nursing Still with poor appetite despite improvement in nausea  Goal of Therapy: INR 2-3  Plan: Warfarin 7.5 mg today Daily INR CBC at least q72 hr while on warfarin Monitor for signs of bleeding or thrombosis Given recent reduced PO intake, hypersensitivity to warfarin; would resume at no more than 7.5 mg daily until INR trend more stable and PO intake improved   Reuel Boom, PharmD, BCPS [phone] 06/30/2022, 11:20 AM

## 2022-07-01 ENCOUNTER — Other Ambulatory Visit (HOSPITAL_COMMUNITY): Payer: Self-pay

## 2022-07-01 ENCOUNTER — Encounter (HOSPITAL_COMMUNITY): Payer: Self-pay | Admitting: *Deleted

## 2022-07-01 ENCOUNTER — Other Ambulatory Visit: Payer: Self-pay | Admitting: Cardiology

## 2022-07-01 DIAGNOSIS — I5033 Acute on chronic diastolic (congestive) heart failure: Secondary | ICD-10-CM

## 2022-07-01 DIAGNOSIS — R7989 Other specified abnormal findings of blood chemistry: Secondary | ICD-10-CM

## 2022-07-01 DIAGNOSIS — I420 Dilated cardiomyopathy: Secondary | ICD-10-CM

## 2022-07-01 DIAGNOSIS — R112 Nausea with vomiting, unspecified: Secondary | ICD-10-CM | POA: Diagnosis not present

## 2022-07-01 LAB — BASIC METABOLIC PANEL
Anion gap: 13 (ref 5–15)
BUN: 13 mg/dL (ref 6–20)
CO2: 28 mmol/L (ref 22–32)
Calcium: 8.5 mg/dL — ABNORMAL LOW (ref 8.9–10.3)
Chloride: 92 mmol/L — ABNORMAL LOW (ref 98–111)
Creatinine, Ser: 1.08 mg/dL — ABNORMAL HIGH (ref 0.44–1.00)
GFR, Estimated: >60 mL/min (ref >60)
Glucose, Bld: 130 mg/dL — ABNORMAL HIGH (ref 70–99)
Potassium: 3.4 mmol/L — ABNORMAL LOW (ref 3.5–5.1)
Sodium: 133 mmol/L — ABNORMAL LOW (ref 135–145)

## 2022-07-01 LAB — PROTIME-INR
INR: 2.4 — ABNORMAL HIGH (ref 0.8–1.2)
Prothrombin Time: 25.7 seconds — ABNORMAL HIGH (ref 11.4–15.2)

## 2022-07-01 LAB — MAGNESIUM: Magnesium: 1.6 mg/dL — ABNORMAL LOW (ref 1.7–2.4)

## 2022-07-01 LAB — GLUCOSE, CAPILLARY
Glucose-Capillary: 132 mg/dL — ABNORMAL HIGH (ref 70–99)
Glucose-Capillary: 149 mg/dL — ABNORMAL HIGH (ref 70–99)

## 2022-07-01 MED ORDER — LOSARTAN POTASSIUM 50 MG PO TABS
50.0000 mg | ORAL_TABLET | Freq: Every day | ORAL | Status: DC
  Administered 2022-07-01: 50 mg via ORAL
  Filled 2022-07-01: qty 1

## 2022-07-01 MED ORDER — TRAZODONE HCL 50 MG PO TABS
50.0000 mg | ORAL_TABLET | Freq: Every day | ORAL | 0 refills | Status: DC
  Filled 2022-07-01: qty 30, 30d supply, fill #0

## 2022-07-01 MED ORDER — FERROUS SULFATE 325 (65 FE) MG PO TABS
325.0000 mg | ORAL_TABLET | Freq: Every day | ORAL | 0 refills | Status: DC
  Filled 2022-07-01: qty 30, 30d supply, fill #0

## 2022-07-01 MED ORDER — METOPROLOL SUCCINATE ER 25 MG PO TB24
25.0000 mg | ORAL_TABLET | Freq: Every day | ORAL | Status: DC
  Administered 2022-07-01: 25 mg via ORAL
  Filled 2022-07-01: qty 1

## 2022-07-01 MED ORDER — NOVOLOG FLEXPEN 100 UNIT/ML ~~LOC~~ SOPN
12.0000 [IU] | PEN_INJECTOR | Freq: Every day | SUBCUTANEOUS | 0 refills | Status: DC
  Filled 2022-07-01: qty 15, 125d supply, fill #0

## 2022-07-01 MED ORDER — SENNOSIDES-DOCUSATE SODIUM 8.6-50 MG PO TABS
1.0000 | ORAL_TABLET | Freq: Every evening | ORAL | 0 refills | Status: DC | PRN
  Filled 2022-07-01: qty 30, 30d supply, fill #0

## 2022-07-01 MED ORDER — LOSARTAN POTASSIUM 50 MG PO TABS
50.0000 mg | ORAL_TABLET | Freq: Every day | ORAL | 0 refills | Status: DC
  Filled 2022-07-01: qty 30, 30d supply, fill #0

## 2022-07-01 MED ORDER — METOPROLOL SUCCINATE ER 25 MG PO TB24
25.0000 mg | ORAL_TABLET | Freq: Every day | ORAL | 0 refills | Status: DC
  Filled 2022-07-01: qty 30, 30d supply, fill #0

## 2022-07-01 MED ORDER — MAGNESIUM OXIDE -MG SUPPLEMENT 400 (240 MG) MG PO TABS
800.0000 mg | ORAL_TABLET | ORAL | Status: DC
  Administered 2022-07-01: 800 mg via ORAL
  Filled 2022-07-01: qty 2

## 2022-07-01 MED ORDER — INSULIN GLARGINE 100 UNIT/ML SOLOSTAR PEN
15.0000 [IU] | PEN_INJECTOR | Freq: Every day | SUBCUTANEOUS | 0 refills | Status: DC
  Filled 2022-07-01: qty 15, 100d supply, fill #0

## 2022-07-01 MED ORDER — FUROSEMIDE 40 MG PO TABS
80.0000 mg | ORAL_TABLET | Freq: Two times a day (BID) | ORAL | Status: DC
  Administered 2022-07-01: 80 mg via ORAL
  Filled 2022-07-01 (×2): qty 2

## 2022-07-01 MED ORDER — EMPAGLIFLOZIN 10 MG PO TABS
10.0000 mg | ORAL_TABLET | Freq: Every day | ORAL | 0 refills | Status: DC
  Filled 2022-07-01: qty 30, 30d supply, fill #0

## 2022-07-01 MED ORDER — SPIRONOLACTONE 25 MG PO TABS
12.5000 mg | ORAL_TABLET | Freq: Every day | ORAL | 0 refills | Status: DC
  Filled 2022-07-01: qty 30, 60d supply, fill #0

## 2022-07-01 MED ORDER — WARFARIN SODIUM 5 MG PO TABS: 7.5000 mg | ORAL_TABLET | ORAL | Status: DC

## 2022-07-01 MED ORDER — FUROSEMIDE 80 MG PO TABS
80.0000 mg | ORAL_TABLET | Freq: Two times a day (BID) | ORAL | 0 refills | Status: DC
  Filled 2022-07-01: qty 60, 30d supply, fill #0

## 2022-07-01 MED ORDER — WARFARIN SODIUM 10 MG PO TABS
11.2500 mg | ORAL_TABLET | ORAL | Status: DC
  Filled 2022-07-01: qty 1

## 2022-07-01 MED ORDER — WARFARIN SODIUM 7.5 MG PO TABS
7.5000 mg | ORAL_TABLET | Freq: Every day | ORAL | 0 refills | Status: DC
  Filled 2022-07-01: qty 40, 20d supply, fill #0

## 2022-07-01 MED ORDER — POTASSIUM CHLORIDE CRYS ER 20 MEQ PO TBCR
40.0000 meq | EXTENDED_RELEASE_TABLET | ORAL | Status: DC
  Administered 2022-07-01: 40 meq via ORAL
  Filled 2022-07-01: qty 2

## 2022-07-01 NOTE — Progress Notes (Signed)
Assumed care of pt. Pt resting w/ eyes closed, call bell in reach, no signs of distress, lights dimmed, door closed, will continue to monitor

## 2022-07-01 NOTE — Progress Notes (Signed)
Clarification Documentation of dismissal from the [facility] on 10/22/21  Prior Versions: 1. Jerl Mina, RN (Registered Nurse) at 10/22/2021  3:58 PM - Signed   Pt. Refused to arrange follow up appointments. She also stated that she did not want to come back to the clinic anymore. Dr. Aundra Dubin informed of patient not wanting to return to clinic anymore.   Dr. Aundra Dubin has approved her formal dismissal from the [facility], and she can be followed by general Cardiology going forward.

## 2022-07-01 NOTE — Discharge Summary (Signed)
Physician Discharge Summary  [name] BBC:[id number] DOB: 1976-03-29 DOA: 06/24/2022  PCP: Donney Dice, DO  Admit date: 06/24/2022 Discharge date: 07/01/2022  Admitted From: Home Disposition: Home  Recommendations for Outpatient Follow-up:  Follow up with PCP in 1-2 weeks Please obtain BMP/CBC in one week your next doctors visit.  Outpatient cardiology appointment has been given Home cardiac medications-Lipitor 40 mg, Jardiance 10 mg daily, Lasix 80 mg twice daily, losartan 50 mg, Aldactone 12.5 mg, Coumadin, Toprol-XL. She has been given as requested refills for her home insulin regimen Outpatient referral to endocrinology P.o. iron with bowel regimen on discharge  Home Health: Home health RN arranged Equipment/Devices: None Discharge Condition: Stable CODE STATUS: Full Diet recommendation: Heart healthy  Brief/Interim Summary:  46 y.o. female with medical history significant of abnormal Pap smear, chronic combined systolic and diastolic heart failure, normocytic anemia, asthma, left ventricle thrombus, paroxysmal atrial fibrillation on warfarin, CAD, history NSTEMI, chronic elevated troponin, type 2 diabetes, history of episodes of DKA, hydrosalpinx, polycystic ovarian disease, hypertension, episodes of hypertensive emergency, depression, migraine headaches, obesity currently class II obesity with a BMI of 38.17 kg/m, obstructive sleep apnea not on CPAP who presented to the emergency department due to chest pain, nausea and vomiting for a month.  She was seen by GI (Dr. Candis Schatz) back in April with workup showing gastritis and no H. pylori.  Upon admission patient was found to be in fluid overload requiring IV Lasix, cardiology team was consulted.  Over the course of several days patient became euvolemic.  Cardiac medications were titrated as mentioned above.  She was also educated by diabetic coordinator and her blood glucose was better controlled.  Today she is medically stable  for discharge. Per documentation it appears that patient refused to follow-up with CHF clinic outpatient. I fear this will lead to patient's medical noncompliance and with worsening diabetes and cardiac disease she is at high risk for recurrent admission and mortality   Assessment and Plan:  Nausea, vomiting; improved.  -I suspect from intestinal wall edema.  Right upper quadrant ultrasound is negative.  She improved during the hospitalization and she was diuresed.   Diabetes mellitus type 2, poorly controlled with hyperglycemia and hypoglycemia -A1c > 15.5.  Currently on Levemir 15 units daily, sliding scale and Accu-Cheks.  New prescriptions have been given -zSeen by diabetic coordinator   Acute on chronic combined systolic diastolic heart failure -EF <50%, grade 3 diastolic dysfunction.  Patient's medications were adjusted as mentioned above including losartan, Toprol-XL, Aldactone, Lasix and Jardiance.  Appears that patient refused to follow-up outpatient with CHF cardiology team.   Anemia of chronic disease, microcytosis - Hemoglobin appears to be stable around 10.  P.o. iron with bowel regimen on discharge   Demand ischemia -Troponin 99, 87.  Remains chest pain-free   Hyperlipidemia -Lipitor   History left ventricular mural thrombus -On Coumadin    OSA -CPAP nightly    Discharge Diagnoses:  Principal Problem:   Nausea and vomiting Active Problems:   Poorly controlled type 2 diabetes mellitus (HCC)   Anemia of chronic disease   Obstructive sleep apnea   Hyperlipidemia associated with type 2 diabetes mellitus (HCC)   Chronic combined systolic and diastolic heart failure (HCC)   Paroxysmal atrial fibrillation (HCC)   Chronic chest wall pain   History of left ventricular mural thrombus   Hypertensive urgency   Mild protein malnutrition (HCC)   Class 2 obesity   Hyperbilirubinemia   Nausea & vomiting  Consultations: Cardiology  Subjective: Patient seen  and examined at bedside.  No complaints this morning tells me she overall feels great.  While I was in the room patient agreed to 100 medical treatment and outpatient follow-up but later it appears that patient started refusing her care including follow-ups  Discharge Exam: Vitals:   07/01/22 0314 07/01/22 0924  BP: (!) 143/96 (!) 128/92  Pulse: 94 91  Resp: 16   Temp: 98.4 F (36.9 C)   SpO2: 100%    Vitals:   06/30/22 1706 06/30/22 2146 07/01/22 0314 07/01/22 0924  BP: 113/82 (!) 125/97 (!) 143/96 (!) 128/92  Pulse: 97 87 94 91  Resp:  18 16   Temp:  97.8 F (36.6 C) 98.4 F (36.9 C)   TempSrc:  Oral Oral   SpO2:  98% 100%   Weight:      Height:        General: Pt is alert, awake, not in acute distress Cardiovascular: RRR, S1/S2 +, no rubs, no gallops Respiratory: CTA bilaterally, no wheezing, no rhonchi Abdominal: Soft, NT, ND, bowel sounds + Extremities: no edema, no cyanosis  Discharge Instructions  Discharge Instructions     Ambulatory referral to Endocrinology   Complete by: As directed    Uncontrolled DM2      Allergies as of 07/01/2022       Reactions   Fish Allergy Swelling   Shellfish Allergy Anaphylaxis, Swelling, Other (See Comments)   Airway involvement including EMS - Has Epi-Pen   Metformin And Related Diarrhea   Trulicity [dulaglutide] Nausea And Vomiting, Other (See Comments)   Multiple episodes of vomiting over multiple days   Nsaids Other (See Comments)   Per PCP interferes with daily meds (heart failure)   Advil [ibuprofen] Other (See Comments)   Per PCP interferes with daily meds (heart failure)        Medication List     STOP taking these medications    carvedilol 3.125 MG tablet Commonly known as: COREG       TAKE these medications    Accu-Chek Softclix Lancets lancets Use as instructed   Accu-Chek Softclix Lancets lancets Use up 4 (four) times daily as directed   acetaminophen 500 MG tablet Commonly known as:  TYLENOL Take 500-1,000 mg by mouth every 6 (six) hours as needed for mild pain or headache. What changed: Another medication with the same name was removed. Continue taking this medication, and follow the directions you see here.   atorvastatin 40 MG tablet Commonly known as: LIPITOR TAKE 1 TABLET BY MOUTH DAILY AT 6 PM. What changed:  how much to take when to take this   blood glucose meter kit and supplies Use up to 4 (four) times daily as directed   empagliflozin 10 MG Tabs tablet Commonly known as: JARDIANCE Take 1 tablet (10 mg total) by mouth daily.   ferrous sulfate 325 (65 FE) MG tablet Take 1 tablet (325 mg total) by mouth daily with breakfast. Start taking on: July 02, 2022   furosemide 80 MG tablet Commonly known as: LASIX Take 1 tablet (80 mg total) by mouth 2 (two) times daily. What changed: when to take this   hydrOXYzine 10 MG tablet Commonly known as: ATARAX Take 1 tablet (10 mg total) by mouth 3 (three) times daily as needed. What changed: reasons to take this   insulin glargine 100 UNIT/ML Solostar Pen Commonly known as: LANTUS Inject 15 Units into the skin daily. What changed:  how much  to take when to take this   losartan 50 MG tablet Commonly known as: COZAAR Take 1 tablet (50 mg total) by mouth daily. What changed:  medication strength how much to take   metoprolol succinate 25 MG 24 hr tablet Commonly known as: TOPROL-XL Take 1 tablet (25 mg total) by mouth daily.   NovoLOG FlexPen 100 UNIT/ML FlexPen Generic drug: insulin aspart Inject 12 Units into the skin daily before supper.   ondansetron 4 MG disintegrating tablet Commonly known as: ZOFRAN-ODT Dissolve 1 tablet (4 mg total) by mouth every 8 (eight) hours as needed for nausea or vomiting.   OneTouch Verio test strip Generic drug: glucose blood Use up to 4 (four) times daily as directed   oxyCODONE 5 MG immediate release tablet Commonly known as: Oxy IR/ROXICODONE Take 1  tablet (5 mg total) by mouth every 6 (six) hours as needed for moderate pain.   polyethylene glycol 17 g packet Commonly known as: MIRALAX / GLYCOLAX Take 17 g by mouth daily as needed for mild constipation.   senna-docusate 8.6-50 MG tablet Commonly known as: Senokot-S Take 1 tablet by mouth 2 (two) times daily. What changed: Another medication with the same name was added. Make sure you understand how and when to take each.   senna-docusate 8.6-50 MG tablet Commonly known as: Senokot-S Take 1 tablet by mouth at bedtime as needed for moderate constipation. What changed: You were already taking a medication with the same name, and this prescription was added. Make sure you understand how and when to take each.   sertraline 50 MG tablet Commonly known as: ZOLOFT Take 50 mg by mouth daily.   spironolactone 25 MG tablet Commonly known as: ALDACTONE Take 0.5 tablets (12.5 mg total) by mouth daily.   traZODone 50 MG tablet Commonly known as: DESYREL Take 1 tablet (50 mg total) by mouth at bedtime.   Unifine Pentips 31G X 5 MM Misc Generic drug: Insulin Pen Needle USE AS DIRECTED 2 TIMES DAILY   warfarin 7.5 MG tablet Commonly known as: COUMADIN Take as directed. If you are unsure how to take this medication, talk to your nurse or doctor. Original instructions: Take 1-2 tablets (7.5-15 mg total) by mouth daily as directed by Coumadin Clinic What changed:  how much to take when to take this additional instructions               Durable Medical Equipment  (From admission, onward)           Start     Ordered   07/01/22 0756  Heart failure home health orders  (Heart failure home health orders / Face to face)  Once       Comments: Heart Failure Follow-up Care:  Verify follow-up appointments per Patient Discharge Instructions. Confirm transportation arranged. Reconcile home medications with discharge medication list. Remove discontinued medications from use. Assist  patient/caregiver to manage medications using pill box. Reinforce low sodium food selection Assessments: Vital signs and oxygen saturation at each visit. Assess home environment for safety concerns, caregiver support and availability of low-sodium foods. Consult Education officer, museum, PT/OT, Dietitian, and CNA based on assessments. Perform comprehensive cardiopulmonary assessment. Notify MD for any change in condition or weight gain of 3 pounds in one day or 5 pounds in one week with symptoms. Daily Weights and Symptom Monitoring: Ensure patient has access to scales. Teach patient/caregiver to weigh daily before breakfast and after voiding using same scale and record.    Teach patient/caregiver to track weight  and symptoms and when to notify Provider. Activity: Develop individualized activity plan with patient/caregiver.   Question Answer Comment  Heart Failure Follow-up Care [facility] at [phone]   Obtain the following labs Basic Metabolic Panel   Lab frequency Weekly   Fax lab results to [facility] Clinic at [phone]   Diet Low Sodium Heart Healthy   Fluid restrictions: 1500 mL Fluid      07/01/22 0758            Follow-up [place] Follow up on 07/07/2022.   Specialty: Cardiology Why: Please come to the [facility] on 12/14 to have labs drawn. Lab hours are 8AM to 3 PM Contact information: [place] [employer] [phone] [place] [address] [facility]. Go in 11 day(s).   Specialty: Cardiology Why: Hospital follow up 07/13/22 at 3 pm Please bring a current medication list to appointment FREE valet parking, Entrance C, off [employer] information: [address] [id number] [place] [address] [place], [place], DO Follow up in  1 week(s).   Specialty: Family Medicine Contact information: [place] [address] [phone]                Allergies  Allergen Reactions   Fish Allergy Swelling   Shellfish Allergy Anaphylaxis, Swelling and Other (See Comments)    Airway involvement including EMS - Has Epi-Pen   Metformin And Related Diarrhea   Trulicity [Dulaglutide] Nausea And Vomiting and Other (See Comments)    Multiple episodes of vomiting over multiple days   Nsaids Other (See Comments)    Per PCP interferes with daily meds (heart failure)   Advil [Ibuprofen] Other (See Comments)    Per PCP interferes with daily meds (heart failure)    You were cared for by a hospitalist during your hospital stay. If you have any questions about your discharge medications or the care you received while you were in the hospital after you are discharged, you can call the unit and asked to speak with the hospitalist on call if the hospitalist that took care of you is not available. Once you are discharged, your primary care physician will handle any further medical issues. Please note that no refills for any discharge medications will be authorized once you are discharged, as it is imperative that you return to your primary care physician (or establish a relationship with a primary care physician if you do not have one) for your aftercare needs so that they can reassess your need for medications and monitor your lab values.   Procedures/Studies: US Abdomen Limited RUQ (LIVER/GB)  Result Date: 06/24/2022 CLINICAL DATA:  Right upper quadrant pain. EXAM: ULTRASOUND ABDOMEN LIMITED RIGHT UPPER QUADRANT COMPARISON:  November 04, 2021 FINDINGS: Gallbladder: No gallstones are visualized. The gallbladder wall measures 3.6 mm in thickness. No sonographic Murphy sign noted by sonographer. Common bile duct: Diameter: 3.4 mm Liver: No focal lesion identified. Within normal limits in parenchymal echogenicity. Portal vein is  patent on color Doppler imaging with normal direction of blood flow towards the liver. Other: None. IMPRESSION: Unremarkable right upper quadrant ultrasound. Electronically Signed   By: Virgina Norfolk M.D.   On: 06/24/2022 23:16   DG Abd 1 View  Result Date: 06/04/2022  CLINICAL DATA:  Abdominal pain with nausea and vomiting. EXAM: ABDOMEN - 1 VIEW COMPARISON:  CT, 05/06/2022. FINDINGS: Normal bowel gas pattern. Mild generalized increase in the colonic stool burden. No evidence of renal or ureteral stones. Several small phleboliths in the pelvis. Soft tissues otherwise unremarkable. Skeletal structures are unremarkable. IMPRESSION: 1. No acute findings.  No evidence of bowel obstruction. 2. Mild increase in the colonic stool burden. Electronically Signed   By: Lajean Manes M.D.   On: 06/04/2022 18:52   DG Chest 2 View  Result Date: 06/02/2022 CLINICAL DATA:  Chest pain and shortness of breath. EXAM: CHEST - 2 VIEW COMPARISON:  05/06/2022 CTA chest.  05/05/2022 chest radiograph. FINDINGS: Both views are degraded by patient body habitus and low lung volumes. Midline trachea. Cardiomegaly accentuated by AP portable technique. No right and no definite left pleural effusion. No pneumothorax. Low lung volumes with resultant pulmonary interstitial prominence. No lobar consolidation. IMPRESSION: Mild limitations as detailed above. Cardiomegaly and low lung volumes, without definite acute disease. Electronically Signed   By: Abigail Miyamoto M.D.   On: 06/02/2022 14:46     The results of significant diagnostics from this hospitalization (including imaging, microbiology, ancillary and laboratory) are listed below for reference.     Microbiology: Recent Results (from the past 240 hour(s))  Resp Panel by RT-PCR (Flu A&B, Covid) Anterior Nasal Swab     Status: None   Collection Time: 06/24/22  6:33 PM   Specimen: Anterior Nasal Swab  Result Value Ref Range Status   SARS Coronavirus 2 by RT PCR NEGATIVE  NEGATIVE Final    Comment: (NOTE) SARS-CoV-2 target nucleic acids are NOT DETECTED.  The SARS-CoV-2 RNA is generally detectable in upper respiratory specimens during the acute phase of infection. The lowest concentration of SARS-CoV-2 viral copies this assay can detect is 138 copies/mL. A negative result does not preclude SARS-Cov-2 infection and should not be used as the sole basis for treatment or other patient management decisions. A negative result may occur with  improper specimen collection/handling, submission of specimen other than nasopharyngeal swab, presence of viral mutation(s) within the areas targeted by this assay, and inadequate number of viral copies(<138 copies/mL). A negative result must be combined with clinical observations, patient history, and epidemiological information. The expected result is Negative.  Fact Sheet for Patients:  EntrepreneurPulse.com.au  Fact Sheet for Healthcare Providers:  IncredibleEmployment.be  This test is no t yet approved or cleared by the [place] FDA and  has been authorized for detection and/or diagnosis of SARS-CoV-2 by FDA under an Emergency Use Authorization (EUA). This EUA will remain  in effect (meaning this test can be used) for the duration of the COVID-19 declaration under Section 564(b)(1) of the Act, 21 U.S.C.section 360bbb-3(b)(1), unless the authorization is terminated  or revoked sooner.       Influenza A by PCR NEGATIVE NEGATIVE Final   Influenza B by PCR NEGATIVE NEGATIVE Final    Comment: (NOTE) The Xpert Xpress SARS-CoV-2/FLU/RSV plus assay is intended as an aid in the diagnosis of influenza from Nasopharyngeal swab specimens and should not be used as a sole basis for treatment. Nasal washings and aspirates are unacceptable for Xpert Xpress SARS-CoV-2/FLU/RSV testing.  Fact Sheet for Patients: EntrepreneurPulse.com.au  Fact Sheet for Healthcare  Providers: IncredibleEmployment.be  This test is not yet approved or cleared by the [place] FDA and has been authorized for detection and/or diagnosis of SARS-CoV-2 by FDA under an Emergency Use Authorization (EUA). This EUA will remain  in effect (meaning this test can be used) for the duration of the COVID-19 declaration under Section 564(b)(1) of the Act, 21 U.S.C. section 360bbb-3(b)(1), unless the authorization is terminated or revoked.  Performed at [facility], [place] [address] [place], [place] [address]      Labs: BNP (last 3 results) Recent Labs    05/05/22 2148 06/02/22 1613 06/30/22 0705  BNP 782.2* 758.1* 893.7*   Basic Metabolic Panel: Recent Labs  Lab 06/28/22 0548 06/29/22 0648 06/29/22 1015 06/30/22 0705 06/30/22 0755 07/01/22 0635  NA 132* 135 134*  --  136 133*  K 4.0 3.8 4.0  --  3.8 3.4*  CL 98 98 97*  --  98 92*  CO2 _0 --  28 28  GLUCOSE 167* 111* 207*  --  160* 130*  BUN 24* 22* 22*  --  18 13  CREATININE 1.04* 0.88 1.09*  --  1.14* 1.08*  CALCIUM 8.6* 8.7* 8.4*  --  8.6* 8.5*  MG  --   --   --  1.7  --  1.6*   Liver Function Tests: Recent Labs  Lab 06/24/22 1848 06/26/22 0537 06/27/22 0425  AST 23 31 55*  ALT _1 ALKPHOS 151* 144* 165*  BILITOT 2.8* 2.3* 1.8*  PROT 8.1 6.9 7.2  ALBUMIN 3.1* 2.6* 2.7*   Recent Labs  Lab 06/24/22 1848  LIPASE 24   No results for input(s): "AMMONIA" in the last 168 hours. CBC: Recent Labs  Lab 06/24/22 1848 06/26/22 0537 06/29/22 0648 06/29/22 1015 06/30/22 1233  WBC 8.5 7.1 6.6 6.2 8.4  NEUTROABS 5.2  --   --   --   --   HGB 11.3* 10.2* 10.7* 10.7* 11.6*  HCT 36.4 33.8* 35.3* 34.6* 38.3  MCV 69.2* 69.0* 67.0* 67.2* 67.2*  PLT 301 252 303 291 289   Cardiac Enzymes: No results for input(s): "CKTOTAL", "CKMB", "CKMBINDEX", "TROPONINI" in the last 168 hours. BNP: Invalid input(s): "POCBNP" CBG: Recent Labs  Lab 06/30/22 1143  06/30/22 1645 06/30/22 2144 07/01/22 0310 07/01/22 0747  GLUCAP 218* 251* 115* 132* 149*   D-Dimer No results for input(s): "DDIMER" in the last 72 hours. Hgb A1c No results for input(s): "HGBA1C" in the last 72 hours. Lipid Profile No results for input(s): "CHOL", "HDL", "LDLCALC", "TRIG", "CHOLHDL", "LDLDIRECT" in the last 72 hours. Thyroid function studies No results for input(s): "TSH", "T4TOTAL", "T3FREE", "THYROIDAB" in the last 72 hours.  Invalid input(s): "FREET3" Anemia work up Recent Labs    06/29/22 1015 06/30/22 0705  VITAMINB12 1,470*  --   FOLATE  --  8.6  FERRITIN 46  --   TIBC 392  --   IRON 25*  --    Urinalysis    Component Value Date/Time   COLORURINE YELLOW 06/24/2022 1832   APPEARANCEUR HAZY (A) 06/24/2022 1832   LABSPEC 1.015 06/24/2022 1832   PHURINE 6.0 06/24/2022 1832   GLUCOSEU >=500 (A) 06/24/2022 1832   HGBUR SMALL (A) 06/24/2022 1832   BILIRUBINUR SMALL (A) 06/24/2022 1832   BILIRUBINUR NEG 04/05/2013 1057   KETONESUR 5 (A) 06/24/2022 1832   PROTEINUR 100 (A) 06/24/2022 1832   UROBILINOGEN 0.2 08/09/2013 2031   NITRITE NEGATIVE 06/24/2022 1832   LEUKOCYTESUR NEGATIVE 06/24/2022 1832   Sepsis Labs Recent Labs  Lab 06/26/22 0537 06/29/22 0648 06/29/22 1015 06/30/22 1233  WBC 7.1 6.6 6.2 8.4   Microbiology Recent Results (from the past 240 hour(s))  Resp Panel by RT-PCR (  Flu A&B, Covid) Anterior Nasal Swab     Status: None   Collection Time: 06/24/22  6:33 PM   Specimen: Anterior Nasal Swab  Result Value Ref Range Status   SARS Coronavirus 2 by RT PCR NEGATIVE NEGATIVE Final    Comment: (NOTE) SARS-CoV-2 target nucleic acids are NOT DETECTED.  The SARS-CoV-2 RNA is generally detectable in upper respiratory specimens during the acute phase of infection. The lowest concentration of SARS-CoV-2 viral copies this assay can detect is 138 copies/mL. A negative result does not preclude SARS-Cov-2 infection and should not be used as  the sole basis for treatment or other patient management decisions. A negative result may occur with  improper specimen collection/handling, submission of specimen other than nasopharyngeal swab, presence of viral mutation(s) within the areas targeted by this assay, and inadequate number of viral copies(<138 copies/mL). A negative result must be combined with clinical observations, patient history, and epidemiological information. The expected result is Negative.  Fact Sheet for Patients:  EntrepreneurPulse.com.au  Fact Sheet for Healthcare Providers:  IncredibleEmployment.be  This test is no t yet approved or cleared by the [place] FDA and  has been authorized for detection and/or diagnosis of SARS-CoV-2 by FDA under an Emergency Use Authorization (EUA). This EUA will remain  in effect (meaning this test can be used) for the duration of the COVID-19 declaration under Section 564(b)(1) of the Act, 21 U.S.C.section 360bbb-3(b)(1), unless the authorization is terminated  or revoked sooner.       Influenza A by PCR NEGATIVE NEGATIVE Final   Influenza B by PCR NEGATIVE NEGATIVE Final    Comment: (NOTE) The Xpert Xpress SARS-CoV-2/FLU/RSV plus assay is intended as an aid in the diagnosis of influenza from Nasopharyngeal swab specimens and should not be used as a sole basis for treatment. Nasal washings and aspirates are unacceptable for Xpert Xpress SARS-CoV-2/FLU/RSV testing.  Fact Sheet for Patients: EntrepreneurPulse.com.au  Fact Sheet for Healthcare Providers: IncredibleEmployment.be  This test is not yet approved or cleared by the [place] FDA and has been authorized for detection and/or diagnosis of SARS-CoV-2 by FDA under an Emergency Use Authorization (EUA). This EUA will remain in effect (meaning this test can be used) for the duration of the COVID-19 declaration under Section 564(b)(1) of  the Act, 21 U.S.C. section 360bbb-3(b)(1), unless the authorization is terminated or revoked.  Performed at [facility], [place] [address], [place], [place] [address]      Time coordinating discharge:  I have spent 35 minutes face to face with the patient and on the ward discussing the patients care, assessment, plan and disposition with other care givers. >50% of the time was devoted counseling the patient about the risks and benefits of treatment/Discharge disposition and coordinating care.   SIGNED:   Damita Lack, MD  [facility] 07/01/2022, 11:42 AM   If 7PM-7AM, please contact night-coverage

## 2022-07-01 NOTE — Progress Notes (Signed)
Rounding Note    Patient Name: [name] Date of Encounter: 07/01/2022  [place] Cardiologist: Loralie Champagne, MD   Subjective   No CP and SOB resolved  Inpatient Medications    Scheduled Meds:  atorvastatin  40 mg Oral Daily   carvedilol  3.125 mg Oral BID WC   empagliflozin  10 mg Oral Daily   ferrous sulfate  325 mg Oral Q breakfast   furosemide  80 mg Intravenous BID   insulin aspart  0-15 Units Subcutaneous TID WC   insulin detemir  10 Units Subcutaneous Daily   losartan  25 mg Oral Daily   metoCLOPramide (REGLAN) injection  10 mg Intravenous Q6H   pantoprazole  40 mg Oral Daily   senna-docusate  1 tablet Oral BID   sertraline  50 mg Oral Daily   spironolactone  12.5 mg Oral Daily   traZODone  50 mg Oral QHS   Warfarin - Pharmacist Dosing Inpatient   Does not apply q1600   Continuous Infusions:   PRN Meds: acetaminophen **OR** acetaminophen, hydrALAZINE, ipratropium-albuterol, metoprolol tartrate, morphine injection, ondansetron (ZOFRAN) IV, polyethylene glycol, senna-docusate   Vital Signs    Vitals:   06/30/22 1456 06/30/22 1706 06/30/22 2146 07/01/22 0314  BP: (!) 125/96 113/82 (!) 125/97 (!) 143/96  Pulse: 89 97 87 94  Resp: '16  18 16  '$ Temp: 98.2 F (36.8 C)  97.8 F (36.6 C) 98.4 F (36.9 C)  TempSrc: Oral  Oral Oral  SpO2: 99%  98% 100%  Weight:      Height:       No intake or output data in the 24 hours ending 07/01/22 0724     06/24/2022    6:56 PM 06/06/2022    5:00 AM 06/05/2022    6:30 AM  Last 3 Weights  Weight (lbs) 189 lb 180 lb 5.4 oz 176 lb 9.4 oz  Weight (kg) 85.73 kg 81.8 kg 80.1 kg      Telemetry    NSR - Personally Reviewed  ECG    No new EKG to review  - Personally Reviewed  Physical Exam  GEN: Well nourished, well developed in no acute distress HEENT: Normal NECK: No JVD; No carotid bruits LYMPHATICS: No lymphadenopathy CARDIAC:RRR, no murmurs, rubs, gallops RESPIRATORY:  Clear to  auscultation without rales, wheezing or rhonchi  ABDOMEN: Soft, non-tender, non-distended MUSCULOSKELETAL:  No edema; No deformity  SKIN: Warm and dry NEUROLOGIC:  Alert and oriented x 3 PSYCHIATRIC:  Normal affect  Labs    High Sensitivity Troponin:   Recent Labs  Lab 06/02/22 1613 06/02/22 2136 06/24/22 1848 06/24/22 2025  TROPONINIHS 38* 31* 99* 87*      Chemistry Recent Labs  Lab 06/24/22 1848 06/25/22 6761 06/26/22 0537 06/27/22 0425 06/28/22 0548 06/29/22 0648 06/29/22 1015 06/30/22 0705 06/30/22 0755  NA 124*   < > 134* 133*   < > 135 134*  --  136  K 3.6   < > 3.6 4.2   < > 3.8 4.0  --  3.8  CL 91*   < > 103 102   < > 98 97*  --  98  CO2 20*   < > 22 20*   < > 27 27  --  28  GLUCOSE 490*   < > 195* 64*   < > 111* 207*  --  160*  BUN 14   < > 18 22*   < > 22* 22*  --  18  CREATININE 1.09*   < > 1.16* 1.22*   < > 0.88 1.09*  --  1.14*  CALCIUM 8.4*   < > 8.5* 8.4*   < > 8.7* 8.4*  --  8.6*  MG  --   --   --   --   --   --   --  1.7  --   PROT 8.1  --  6.9 7.2  --   --   --   --   --   ALBUMIN 3.1*  --  2.6* 2.7*  --   --   --   --   --   AST 23  --  31 55*  --   --   --   --   --   ALT 14  --  13 16  --   --   --   --   --   ALKPHOS 151*  --  144* 165*  --   --   --   --   --   BILITOT 2.8*  --  2.3* 1.8*  --   --   --   --   --   GFRNONAA >60   < > 60* 56*   < > >60 >60  --  >60  ANIONGAP 13   < > 9 11   < > 10 10  --  10   < > = values in this interval not displayed.     Lipids No results for input(s): "CHOL", "TRIG", "HDL", "LABVLDL", "LDLCALC", "CHOLHDL" in the last 168 hours.  Hematology Recent Labs  Lab 06/29/22 0648 06/29/22 1015 06/30/22 1233  WBC 6.6 6.2 8.4  RBC 5.27* 5.15* 5.70*  HGB 10.7* 10.7* 11.6*  HCT 35.3* 34.6* 38.3  MCV 67.0* 67.2* 67.2*  MCH 20.3* 20.8* 20.4*  MCHC 30.3 30.9 30.3  RDW 21.4* 21.2* 21.8*  PLT 303 291 289    Thyroid  Recent Labs  Lab 06/25/22 0642  TSH 1.887     BNP Recent Labs  Lab 06/30/22 0705   BNP 400.2*     DDimer No results for input(s): "DDIMER" in the last 168 hours.   Radiology    No results found.  Cardiac Studies   Echo   02/22/22   LV thrombus present at apex, 2.2 x 1.4 cm with some mobility. Left ventricular ejection fraction, by estimation, is <20%. The left ventricle has severely decreased function. The left ventricle demonstrates global hypokinesis. The left ventricular internal cavity size was mildly dilated. Left ventricular diastolic parameters are consistent with Grade III diastolic dysfunction (restrictive). 1. Right ventricular systolic function is moderately reduced. The right ventricular size is normal. There is mildly elevated pulmonary artery systolic pressure. The estimated right ventricular systolic pressure is 19.6 mmHg. 2. 3. Left atrial size was mildly dilated. The mitral valve is normal in structure. Mild mitral valve regurgitation. No evidence of mitral stenosis. 4. 5. Tricuspid valve regurgitation is mild to moderate. The aortic valve is tricuspid. Aortic valve regurgitation is not visualized. No aortic stenosis is present. 6. The inferior vena cava is normal in size with <50% respiratory variability, suggesting right atrial pressure of 8 mmHg.   MRI  09/20/21    IMPRESSION: 1. Mildly dilated LV with LV EF 23%, diffuse hypokinesis with inferior akinesis.   2.  Normal RV size with moderate systolic dysfunction, EF 22%.   3. No myocardial LGE so no definitive evidence for prior MI, infiltrative disease, or myocarditis.  R/L heart cath 08/2021      Mid Cx lesion is 30% stenosed.   Prox RCA to Mid RCA lesion is 25% stenosed.   1. Mild, nonobstructive CAD. Nonischemic cardiomyopathy.  2. Elevated right and left heart filling pressures.  3. Preserved cardiac output.  4. PAPI low at 1.5.  5. Pulmonary venous hypertension.   Patient Profile     46 y.o. female  hx of chronic systolic and diastolic CHF EF 95-18%, NICM, PAF on  xarelto, DM-2, anemia, LV thrombus, HTN now admitted 06/24/22 with N&V,  hyperbilirubinemia and CHF.   Assessment & Plan    ASSESSMENT/PLAN:   #Acute on chronic combined systolic/diastolic CHF -she has had multiple admissions for CHF exacerbations related to medical and dietary noncompliance>>admits to eating out a lot -admitted with N/V felt related to bowel wall edema from acute CHF -BNP elevated at 758 but likely underestimated in setting of obesity -Started on Lasix '80mg'$  IV BID but I&O's are incomplete -no weights done either -BMET pending -she appears euvolemic on exam so will change IV lasix to PO '80mg'$  BID -stressed importance of being compliant with meds>>I think she has problems remembering to take her meds so will try to consolidate her meds into once daily -continue GDMT with Jardiance '10mg'$  daily and spiro 12.'5mg'$  daily -increase Losartan to '50mg'$  daily -change Carvedilol to Toprol XL '25mg'$  daily   #Elevated troponin -hsTrop elevated with flat trend at 99>>87 -complains of atypical CP that is sharp and stabbing and no c/w ACS -had cath 08/2021 with mild nonobstructive dz -no further ischemic workup indicated at this time   #LV thrombus -noted on echo back in 2020 and was on coumadin but then changed to Xarelto after cMRI showed no further evidence of thrombus in 08/2021 -recurrent LV thrombus on echo 8/23 and now on Coumadin -INR therapeutic   #ASCAD -mild nonobstructive dz on cath 08/2021 -continue Atorvastatin '40mg'$  daily, Carvedilol 2.'125mg'$  BID -no ASA due to DOAC   #HLD -LDL goal < 70 -LDL 160 in 08/2021 -continue Atorvastatin '40mg'$  daily   #HTN -BP remains elevated -restarted on home meds with Losartan, Carvedilol, spiro  -increase Losaratn to '50mg'$  daily -change Carvedilol to Toprol XL '25mg'$  daily to help improve compliance -will get case management involved to see if their are financial barriers to getting her meds         [facility] will sign off.    Medication Recommendations:  Atorvastatin '40mg'$  daily, Jardiance '10mg'$  daily, Lasix '80mg'$  BID, Losartan '50mg'$  daily, Spironolactone 12.'5mg'$  daily and warfarin Other recommendations (labs, testing, etc):  BMET 1 week Follow up as an outpatient:  TOC HF clinic in 1 week  For questions or updates, please contact [place] Please consult [email or web address] for contact info under        Signed, Fransico Him, MD  07/01/2022, 7:24 AM

## 2022-07-01 NOTE — TOC Progression Note (Signed)
Transition of Care [facility]) - Progression Note    Patient Details  Name: RUMOR SUN MRN: [id number] Date of Birth: [birth year]  Transition [facility] CM/SW Contact  Henrietta Dine, RN Phone Number: 07/01/2022, 9:51 AM  Clinical Narrative:    [facility] consult for HF and medication assistance; not appropriate for HF protocol pt followed by cardiology; pt also not candidate for medication assistance because she has insurance w/ prescription coverage.        Expected Discharge Plan and Services           Expected Discharge Date: 07/01/22                                     Social Determinants of Health (SDOH) Interventions    Readmission Risk Interventions     No data to display

## 2022-07-01 NOTE — Progress Notes (Signed)
ANTICOAGULATION CONSULT NOTE - Follow Up Consult  Pharmacy Consult for Coumadin Indication:  hx afib & left ventricle mural thrombus   Allergies  Allergen Reactions   Fish Allergy Swelling   Shellfish Allergy Anaphylaxis, Swelling and Other (See Comments)    Airway involvement including EMS - Has Epi-Pen   Metformin And Related Diarrhea   Trulicity [Dulaglutide] Nausea And Vomiting and Other (See Comments)    Multiple episodes of vomiting over multiple days   Nsaids Other (See Comments)    Per PCP interferes with daily meds (heart failure)   Advil [Ibuprofen] Other (See Comments)    Per PCP interferes with daily meds (heart failure)    Patient Measurements: Height: '4\' 11"'$  (149.9 cm) Weight: 85.7 kg (189 lb) IBW/kg (Calculated) : 43.2  Vital Signs: Temp: 98.4 F (36.9 C) (12/08 0314) Temp Source: Oral (12/08 0314) BP: 143/96 (12/08 0314) Pulse Rate: 94 (12/08 0314)  Labs: Recent Labs    06/29/22 0648 06/29/22 1015 06/30/22 0705 06/30/22 0755 06/30/22 1233 07/01/22 0635  HGB 10.7* 10.7*  --   --  11.6*  --   HCT 35.3* 34.6*  --   --  38.3  --   PLT 303 291  --   --  289  --   LABPROT 36.6*  --  27.6*  --   --  25.7*  INR 3.7*  --  2.6*  --   --  2.4*  CREATININE 0.88 1.09*  --  1.14*  --  1.08*    Estimated Creatinine Clearance: 63.2 mL/min (A) (by C-G formula based on SCr of 1.08 mg/dL (H)).  Assessment: AC/Heme: hx afib & left ventricle mural thrombus 02/22/22 on warfarin - Hgb 11.6 up, Plts 289 stable, INR 2.4 - PTA warfarin: 7.'5mg'$  on Monday and 11.'25mg'$  all other days.  Goal of Therapy:  INR 2-3 Monitor platelets by anticoagulation protocol: Yes   Plan:  Coumadin home dosing of 7.'5mg'$  on Monday and 11.'25mg'$  all other days. Daily INR  Ionia Schey S. Alford Highland, PharmD, BCPS Clinical Staff Pharmacist [email or web address]  Alford Highland, Rayane Gallardo Stillinger 07/01/2022,8:00 AM

## 2022-07-04 ENCOUNTER — Other Ambulatory Visit (HOSPITAL_COMMUNITY): Payer: Self-pay

## 2022-07-04 ENCOUNTER — Telehealth: Payer: Self-pay

## 2022-07-04 NOTE — Telephone Encounter (Signed)
Transition Care Management Unsuccessful Follow-up Telephone Call  Date of discharge and from where:  [facility] 07/01/2022  Attempts:  1st Attempt  Reason for unsuccessful TCM follow-up call:  Left voice message Juanda Crumble, [place] Direct Dial [phone]

## 2022-07-05 NOTE — Telephone Encounter (Signed)
Transition Care Management Unsuccessful Follow-up Telephone Call  Date of discharge and from where:  [name] 07/01/2022  Attempts:  2nd Attempt  Reason for unsuccessful TCM follow-up call:  Voice mail full Juanda Crumble, [place] Direct Dial [phone]

## 2022-07-05 NOTE — Telephone Encounter (Signed)
Transition Care Management Unsuccessful Follow-up Telephone Call  Date of discharge and from where:  [facility] 07/01/2022  Attempts:  3rd Attempt  Reason for unsuccessful TCM follow-up call:  Left voice message Juanda Crumble, [place] Direct Dial [phone]

## 2022-07-13 ENCOUNTER — Encounter (HOSPITAL_COMMUNITY): Payer: Medicare Other

## 2022-07-28 ENCOUNTER — Other Ambulatory Visit (HOSPITAL_COMMUNITY): Payer: Self-pay

## 2022-08-01 ENCOUNTER — Encounter (HOSPITAL_COMMUNITY): Payer: Self-pay | Admitting: Cardiology

## 2022-08-04 ENCOUNTER — Telehealth: Payer: Self-pay | Admitting: Internal Medicine

## 2022-08-04 ENCOUNTER — Telehealth: Payer: Self-pay

## 2022-08-04 NOTE — Telephone Encounter (Signed)
Spoke to patient she stated she has gained appox 10 lbs within the past 2 weeks.She has increased swelling in both lower legs,feet and abdomen.Sob with exertion.She has had nausea and vomiting.Stated she was recently in hospital and was discharged with these symptoms.Advised to double Lasix dose for the next 3 days then return to normal dose.Appointment scheduled with Laurann Montana NP 1/15 at 8:50 am at [facility] office.Advised to go to ED if she gets worse.

## 2022-08-04 NOTE — Telephone Encounter (Signed)
Pt c/o swelling: STAT is pt has developed SOB within 24 hours  If swelling, where is the swelling located? Legs, feet, and stomach   How much weight have you gained and in what time span? 10 lbs unsure in what time frame   Have you gained 3 pounds in a day or 5 pounds in a week? Yes  Do you have a log of your daily weights (if so, list)? No   Are you currently taking a fluid pill? Yes  Are you currently SOB? Yes, cannot hear over the phone   Have you traveled recently? No   Patient states she has been vomiting unable to keep anything down.

## 2022-08-04 NOTE — Telephone Encounter (Signed)
Patient called nurse line regarding swelling in legs, feet and abdomen. Patient is speaking in complete sentences and is not having SHOB at this time. She states that she is also having vomiting, unable to keep food down. She reports having these symptoms since being discharged from hospital on 12/8. Advised patient that if she is unable to keep down fluids, she should be seen in the ED due to concerns with dehydration.   Offered appointment this afternoon. Patient declined, stating that she cannot leave work early.   Scheduled for tomorrow morning with Dr. Adah Salvage. Supportive measures and ED precautions discussed.  Talbot Grumbling, RN

## 2022-08-05 ENCOUNTER — Encounter: Payer: Self-pay | Admitting: Student

## 2022-08-05 ENCOUNTER — Other Ambulatory Visit: Payer: Self-pay

## 2022-08-05 ENCOUNTER — Ambulatory Visit (INDEPENDENT_AMBULATORY_CARE_PROVIDER_SITE_OTHER): Payer: Medicare Other | Admitting: Student

## 2022-08-05 ENCOUNTER — Encounter (HOSPITAL_COMMUNITY): Payer: Self-pay

## 2022-08-05 ENCOUNTER — Inpatient Hospital Stay (HOSPITAL_COMMUNITY)
Admission: EM | Admit: 2022-08-05 | Discharge: 2022-08-09 | DRG: 292 | Disposition: A | Discharge status: Home / Self Care | Payer: Medicare Other | Attending: Family Medicine | Admitting: Family Medicine

## 2022-08-05 ENCOUNTER — Emergency Department (HOSPITAL_COMMUNITY): Payer: Medicare Other

## 2022-08-05 VITALS — BP 120/88 | HR 111 | Ht 59.0 in | Wt 199.0 lb

## 2022-08-05 DIAGNOSIS — I152 Hypertension secondary to endocrine disorders: Secondary | ICD-10-CM | POA: Diagnosis present

## 2022-08-05 DIAGNOSIS — I5042 Chronic combined systolic (congestive) and diastolic (congestive) heart failure: Secondary | ICD-10-CM | POA: Diagnosis present

## 2022-08-05 DIAGNOSIS — I5043 Acute on chronic combined systolic (congestive) and diastolic (congestive) heart failure: Secondary | ICD-10-CM | POA: Diagnosis not present

## 2022-08-05 DIAGNOSIS — E669 Obesity, unspecified: Secondary | ICD-10-CM | POA: Diagnosis present

## 2022-08-05 DIAGNOSIS — I480 Paroxysmal atrial fibrillation: Secondary | ICD-10-CM | POA: Diagnosis present

## 2022-08-05 DIAGNOSIS — Z79899 Other long term (current) drug therapy: Secondary | ICD-10-CM

## 2022-08-05 DIAGNOSIS — E785 Hyperlipidemia, unspecified: Secondary | ICD-10-CM | POA: Diagnosis present

## 2022-08-05 DIAGNOSIS — I509 Heart failure, unspecified: Secondary | ICD-10-CM

## 2022-08-05 DIAGNOSIS — G4700 Insomnia, unspecified: Secondary | ICD-10-CM | POA: Diagnosis present

## 2022-08-05 DIAGNOSIS — I252 Old myocardial infarction: Secondary | ICD-10-CM

## 2022-08-05 DIAGNOSIS — Z91013 Allergy to seafood: Secondary | ICD-10-CM

## 2022-08-05 DIAGNOSIS — R112 Nausea with vomiting, unspecified: Secondary | ICD-10-CM

## 2022-08-05 DIAGNOSIS — D638 Anemia in other chronic diseases classified elsewhere: Secondary | ICD-10-CM | POA: Diagnosis present

## 2022-08-05 DIAGNOSIS — Z888 Allergy status to other drugs, medicaments and biological substances status: Secondary | ICD-10-CM

## 2022-08-05 DIAGNOSIS — J45909 Unspecified asthma, uncomplicated: Secondary | ICD-10-CM | POA: Diagnosis present

## 2022-08-05 DIAGNOSIS — Z6835 Body mass index (BMI) 35.0-35.9, adult: Secondary | ICD-10-CM

## 2022-08-05 DIAGNOSIS — Z794 Long term (current) use of insulin: Secondary | ICD-10-CM

## 2022-08-05 DIAGNOSIS — E1169 Type 2 diabetes mellitus with other specified complication: Secondary | ICD-10-CM | POA: Diagnosis present

## 2022-08-05 DIAGNOSIS — R109 Unspecified abdominal pain: Secondary | ICD-10-CM | POA: Diagnosis present

## 2022-08-05 DIAGNOSIS — E871 Hypo-osmolality and hyponatremia: Secondary | ICD-10-CM | POA: Diagnosis present

## 2022-08-05 DIAGNOSIS — Z7984 Long term (current) use of oral hypoglycemic drugs: Secondary | ICD-10-CM

## 2022-08-05 DIAGNOSIS — I2510 Atherosclerotic heart disease of native coronary artery without angina pectoris: Secondary | ICD-10-CM | POA: Diagnosis present

## 2022-08-05 DIAGNOSIS — F32A Depression, unspecified: Secondary | ICD-10-CM | POA: Diagnosis present

## 2022-08-05 DIAGNOSIS — E1165 Type 2 diabetes mellitus with hyperglycemia: Secondary | ICD-10-CM | POA: Diagnosis present

## 2022-08-05 DIAGNOSIS — E1159 Type 2 diabetes mellitus with other circulatory complications: Secondary | ICD-10-CM | POA: Diagnosis present

## 2022-08-05 DIAGNOSIS — G4733 Obstructive sleep apnea (adult) (pediatric): Secondary | ICD-10-CM | POA: Diagnosis present

## 2022-08-05 DIAGNOSIS — Z7901 Long term (current) use of anticoagulants: Secondary | ICD-10-CM

## 2022-08-05 DIAGNOSIS — F419 Anxiety disorder, unspecified: Secondary | ICD-10-CM | POA: Diagnosis present

## 2022-08-05 LAB — COMPREHENSIVE METABOLIC PANEL
ALT: 13 U/L (ref 0–44)
AST: 34 U/L (ref 15–41)
Albumin: 2.8 g/dL — ABNORMAL LOW (ref 3.5–5.0)
Alkaline Phosphatase: 204 U/L — ABNORMAL HIGH (ref 38–126)
Anion gap: 13 (ref 5–15)
BUN: 13 mg/dL (ref 6–20)
CO2: 20 mmol/L — ABNORMAL LOW (ref 22–32)
Calcium: 8.5 mg/dL — ABNORMAL LOW (ref 8.9–10.3)
Chloride: 95 mmol/L — ABNORMAL LOW (ref 98–111)
Creatinine, Ser: 0.96 mg/dL (ref 0.44–1.00)
GFR, Estimated: >60 mL/min (ref >60)
Glucose, Bld: 315 mg/dL — ABNORMAL HIGH (ref 70–99)
Potassium: 4.2 mmol/L (ref 3.5–5.1)
Sodium: 128 mmol/L — ABNORMAL LOW (ref 135–145)
Total Bilirubin: 4.1 mg/dL — ABNORMAL HIGH (ref 0.3–1.2)
Total Protein: 7.4 g/dL (ref 6.5–8.1)

## 2022-08-05 LAB — I-STAT BETA HCG BLOOD, ED (MC, WL, AP ONLY): I-stat hCG, quantitative: <5 m[IU]/mL (ref <5)

## 2022-08-05 LAB — CBC
HCT: 39.1 % (ref 36.0–46.0)
Hemoglobin: 12.1 g/dL (ref 12.0–15.0)
MCH: 21.6 pg — ABNORMAL LOW (ref 26.0–34.0)
MCHC: 30.9 g/dL (ref 30.0–36.0)
MCV: 69.7 fL — ABNORMAL LOW (ref 80.0–100.0)
Platelets: 245 10*3/uL (ref 150–400)
RBC: 5.61 MIL/uL — ABNORMAL HIGH (ref 3.87–5.11)
RDW: 26.2 % — ABNORMAL HIGH (ref 11.5–15.5)
WBC: 6.6 10*3/uL (ref 4.0–10.5)
nRBC: 0 % (ref 0.0–0.2)

## 2022-08-05 LAB — GLUCOSE, CAPILLARY: Glucose-Capillary: 211 mg/dL — ABNORMAL HIGH (ref 70–99)

## 2022-08-05 LAB — TROPONIN I (HIGH SENSITIVITY)
Troponin I (High Sensitivity): 42 ng/L — ABNORMAL HIGH (ref <18)
Troponin I (High Sensitivity): 45 ng/L — ABNORMAL HIGH (ref <18)

## 2022-08-05 LAB — BRAIN NATRIURETIC PEPTIDE: B Natriuretic Peptide: 1231 pg/mL — ABNORMAL HIGH (ref 0.0–100.0)

## 2022-08-05 LAB — PROTIME-INR
INR: 1.3 — ABNORMAL HIGH (ref 0.8–1.2)
Prothrombin Time: 16.2 seconds — ABNORMAL HIGH (ref 11.4–15.2)

## 2022-08-05 LAB — CBG MONITORING, ED: Glucose-Capillary: 246 mg/dL — ABNORMAL HIGH (ref 70–99)

## 2022-08-05 MED ORDER — ACETAMINOPHEN 325 MG PO TABS
650.0000 mg | ORAL_TABLET | ORAL | Status: DC | PRN
  Administered 2022-08-05 – 2022-08-07 (×3): 650 mg via ORAL
  Filled 2022-08-05 (×3): qty 2

## 2022-08-05 MED ORDER — ONDANSETRON 4 MG PO TBDP
4.0000 mg | ORAL_TABLET | Freq: Once | ORAL | Status: AC
  Administered 2022-08-05: 4 mg via ORAL
  Filled 2022-08-05: qty 1

## 2022-08-05 MED ORDER — MORPHINE SULFATE (PF) 4 MG/ML IV SOLN
4.0000 mg | Freq: Once | INTRAVENOUS | Status: AC
  Administered 2022-08-05: 4 mg via INTRAVENOUS
  Filled 2022-08-05: qty 1

## 2022-08-05 MED ORDER — PROCHLORPERAZINE EDISYLATE 10 MG/2ML IJ SOLN
5.0000 mg | INTRAMUSCULAR | Status: DC | PRN
  Administered 2022-08-05 – 2022-08-07 (×4): 5 mg via INTRAVENOUS
  Filled 2022-08-05 (×4): qty 2

## 2022-08-05 MED ORDER — ATORVASTATIN CALCIUM 40 MG PO TABS
40.0000 mg | ORAL_TABLET | ORAL | Status: DC
  Administered 2022-08-05 – 2022-08-08 (×2): 40 mg via ORAL
  Filled 2022-08-05 (×2): qty 1

## 2022-08-05 MED ORDER — INSULIN GLARGINE-YFGN 100 UNIT/ML ~~LOC~~ SOLN
15.0000 [IU] | Freq: Every day | SUBCUTANEOUS | Status: DC
  Administered 2022-08-05 – 2022-08-09 (×5): 15 [IU] via SUBCUTANEOUS
  Filled 2022-08-05 (×5): qty 0.15

## 2022-08-05 MED ORDER — OXYCODONE-ACETAMINOPHEN 5-325 MG PO TABS
1.0000 | ORAL_TABLET | Freq: Once | ORAL | Status: AC
  Administered 2022-08-05: 1 via ORAL
  Filled 2022-08-05: qty 1

## 2022-08-05 MED ORDER — SPIRONOLACTONE 12.5 MG HALF TABLET
12.5000 mg | ORAL_TABLET | Freq: Every day | ORAL | Status: DC
  Administered 2022-08-05 – 2022-08-09 (×5): 12.5 mg via ORAL
  Filled 2022-08-05 (×5): qty 1

## 2022-08-05 MED ORDER — LOSARTAN POTASSIUM 50 MG PO TABS
50.0000 mg | ORAL_TABLET | Freq: Every day | ORAL | Status: DC
  Administered 2022-08-05 – 2022-08-09 (×5): 50 mg via ORAL
  Filled 2022-08-05 (×5): qty 1

## 2022-08-05 MED ORDER — INSULIN ASPART 100 UNIT/ML IJ SOLN
0.0000 [IU] | Freq: Three times a day (TID) | INTRAMUSCULAR | Status: DC
  Administered 2022-08-06: 3 [IU] via SUBCUTANEOUS
  Administered 2022-08-06 – 2022-08-08 (×5): 2 [IU] via SUBCUTANEOUS
  Administered 2022-08-08: 3 [IU] via SUBCUTANEOUS
  Administered 2022-08-09: 2 [IU] via SUBCUTANEOUS

## 2022-08-05 MED ORDER — TRAZODONE HCL 50 MG PO TABS
50.0000 mg | ORAL_TABLET | Freq: Every day | ORAL | Status: DC
  Administered 2022-08-05 – 2022-08-08 (×4): 50 mg via ORAL
  Filled 2022-08-05 (×4): qty 1

## 2022-08-05 MED ORDER — HYDROXYZINE HCL 10 MG PO TABS: 10.0000 mg | ORAL_TABLET | Freq: Three times a day (TID) | ORAL | Status: DC | PRN

## 2022-08-05 MED ORDER — WARFARIN - PHARMACIST DOSING INPATIENT: Freq: Every day | Status: DC

## 2022-08-05 MED ORDER — SERTRALINE HCL 50 MG PO TABS
50.0000 mg | ORAL_TABLET | Freq: Every day | ORAL | Status: DC
  Administered 2022-08-05 – 2022-08-09 (×5): 50 mg via ORAL
  Filled 2022-08-05 (×5): qty 1

## 2022-08-05 MED ORDER — EMPAGLIFLOZIN 10 MG PO TABS
10.0000 mg | ORAL_TABLET | Freq: Every day | ORAL | Status: DC
  Administered 2022-08-05 – 2022-08-09 (×5): 10 mg via ORAL
  Filled 2022-08-05 (×5): qty 1

## 2022-08-05 MED ORDER — ENOXAPARIN SODIUM 100 MG/ML IJ SOSY
90.0000 mg | PREFILLED_SYRINGE | Freq: Two times a day (BID) | INTRAMUSCULAR | Status: DC
  Administered 2022-08-05 – 2022-08-09 (×8): 90 mg via SUBCUTANEOUS
  Filled 2022-08-05: qty 1
  Filled 2022-08-05: qty 0.9
  Filled 2022-08-05 (×2): qty 1
  Filled 2022-08-05: qty 0.9
  Filled 2022-08-05 (×5): qty 1

## 2022-08-05 MED ORDER — WARFARIN SODIUM 5 MG PO TABS
12.0000 mg | ORAL_TABLET | Freq: Once | ORAL | Status: AC
  Administered 2022-08-05: 12 mg via ORAL
  Filled 2022-08-05 (×2): qty 2
  Filled 2022-08-05: qty 1

## 2022-08-05 MED ORDER — FUROSEMIDE 10 MG/ML IJ SOLN
80.0000 mg | Freq: Once | INTRAMUSCULAR | Status: AC
  Administered 2022-08-05: 80 mg via INTRAVENOUS
  Filled 2022-08-05: qty 8

## 2022-08-05 NOTE — Assessment & Plan Note (Signed)
Patient's history and exam findings are consistent with volume overloaded likely secondary to CHF exacerbation. On exam she has +3 bilateral lower extremity edema up to her abdomen and found to be 20 pound heavier from last hospitalization few weeks ago. While patient endorses compliance to her Lasix of 80 mg in the morning and 40 mg at night, I suspect her current presentation is likely because she's unable to diuresis adequately due to her reported emesis and not being able to keep her Lasix down.  Given her current presentation I believe the patient will benefit from further workup and close inpatient management.  Advised patient to go to the ED, explained the need for inpatient management.  Patient verbalized understanding and amenable to plan.  Spoke with the ED charge nurse that I will be sending patient over and notified the [facility] inpatient team.

## 2022-08-05 NOTE — Assessment & Plan Note (Addendum)
Stably elevated -Cont home losartan and metop

## 2022-08-05 NOTE — ED Provider Notes (Signed)
[facility] EMERGENCY DEPARTMENT Provider Note   CSN: [id number] Arrival date & time: 08/05/22  1155     History  No chief complaint on file.   [name] is a 47 y.o. female.  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  Patient does check her weights daily and reports using 20 pounds since going home.  She reports that she is taking 80 mg of Lasix in the morning and 40 at night and has been compliant with her medications.  However she also complains about vomiting almost every day with chronic nausea.  She reports that everything hurts and because of the swelling getting worse and is causing abdominal pain and leg pain.  She has not had any fever, productive cough or nasal congestion.  She has been having orthopnea, worsening dyspnea on exertion and reports she cannot take more than a few steps without feeling exhausted with a rapid heartbeat.  She also has not been able to eat much due to the ongoing issues with vomiting.  Occasionally she will see some blood in it but it is not regular.  She reports she just urinates very little.  When she went to the office today they sent her here for further care.  The history is provided by the patient and medical records.       Home Medications Prior to Admission medications   Medication Sig Start Date End Date Taking? Authorizing Provider  Accu-Chek Softclix Lancets lancets Use as instructed 02/12/19   Caroline More, DO  Accu-Chek Softclix Lancets lancets Use up 4 (four) times daily as directed  06/06/22   [place] Donnamarie Poag, DO  acetaminophen (TYLENOL) 500 MG tablet Take 500-1,000 mg by mouth every 6 (six) hours as needed for mild pain or headache.    [provider]  atorvastatin (LIPITOR) 40 MG tablet TAKE 1 TABLET BY MOUTH DAILY AT 6 PM. Patient taking differently: Take 40 mg by mouth every Monday, Wednesday, and Friday. 04/01/20 08/27/22  Larey Dresser, MD  blood glucose meter kit and supplies Use up to 4 (four) times daily as directed 06/06/22   [place] Donnamarie Poag, DO  empagliflozin (JARDIANCE) 10 MG TABS tablet Take 1 tablet (10 mg total) by mouth daily. 07/01/22   Amin, Jeanella Flattery, MD  ferrous sulfate 325 (65 FE) MG tablet Take 1 tablet (325 mg total) by mouth daily with breakfast. 07/02/22   Amin, Jeanella Flattery, MD  furosemide (LASIX) 80 MG tablet Take 1 tablet (80 mg total) by mouth 2 (two) times daily. 07/01/22   Amin, Jeanella Flattery, MD  glucose blood (ONETOUCH VERIO) test strip Use up to 4 (four) times daily as directed 06/06/22   [place] Donnamarie Poag, DO  hydrOXYzine (ATARAX) 10 MG tablet Take 1 tablet (10 mg total) by mouth 3 (three) times daily as needed. Patient taking differently: Take 10 mg by mouth 3 (three) times daily as needed for anxiety. 10/22/21   Gladys Damme, MD  insulin aspart (NOVOLOG FLEXPEN) 100 UNIT/ML FlexPen Inject 12 Units into the skin daily before supper. 07/01/22   Amin, Jeanella Flattery, MD  insulin glargine (LANTUS)  100 UNIT/ML Solostar Pen Inject 15 Units into the skin daily. 07/01/22   Amin, Jeanella Flattery, MD  Insulin Pen Needle (UNIFINE PENTIPS) 31G X 5 MM MISC USE AS DIRECTED 2 TIMES DAILY 09/02/21   Hensel, Jamal Collin, MD  losartan (COZAAR) 50 MG tablet Take 1 tablet (50 mg total) by mouth daily. 07/01/22   Amin, Jeanella Flattery, MD  metoprolol succinate (TOPROL-XL) 25 MG 24 hr tablet Take 1 tablet (25 mg total) by mouth daily. 07/01/22   Amin, Jeanella Flattery, MD  ondansetron (ZOFRAN-ODT) 4 MG disintegrating tablet Dissolve 1 tablet (4 mg total) by mouth every 8  (eight) hours as needed for nausea or vomiting. 06/06/22   [place] Donnamarie Poag, DO  oxyCODONE (OXY IR/ROXICODONE) 5 MG immediate release tablet Take 1 tablet (5 mg total) by mouth every 6 (six) hours as needed for moderate pain. 06/06/22   [place] Eric J, DO  polyethylene glycol (MIRALAX / GLYCOLAX) 17 g packet Take 17 g by mouth daily as needed for mild constipation. Patient not taking: Reported on 06/25/2022 06/06/22   [place] Donnamarie Poag, DO  senna-docusate (SENOKOT-S) 8.6-50 MG tablet Take 1 tablet by mouth 2 (two) times daily. Patient not taking: Reported on 06/25/2022 06/06/22 09/04/22  [place] Donnamarie Poag, DO  senna-docusate (SENOKOT-S) 8.6-50 MG tablet Take 1 tablet by mouth at bedtime as needed for moderate constipation. 07/01/22   Amin, Jeanella Flattery, MD  sertraline (ZOLOFT) 50 MG tablet Take 50 mg by mouth daily.    [provider]  spironolactone (ALDACTONE) 25 MG tablet Take 0.5 tablets (12.5 mg total) by mouth daily. 07/01/22   Amin, Jeanella Flattery, MD  traZODone (DESYREL) 50 MG tablet Take 1 tablet (50 mg total) by mouth at bedtime. 07/01/22   Damita Lack, MD  warfarin (COUMADIN) 7.5 MG tablet Take 1-2 tablets (7.5-15 mg total) by mouth daily as directed by [facility] 07/01/22   Damita Lack, MD  carvedilol (COREG) 3.125 MG tablet Take 1 tablet (3.125 mg total) by mouth 2 (two) times daily with a meal. 02/27/22 07/01/22  Oswald Hillock, MD      Allergies    Fish allergy, Shellfish allergy, Metformin and related, Trulicity [dulaglutide], Nsaids, and Advil [ibuprofen]    Review of Systems   Review of Systems  Physical Exam Updated Vital Signs BP (!) 163/130   Pulse (!) 110   Temp 97.9 F (36.6 C)   Resp 17   LMP 05/07/2022 Comment: irreg LMP  SpO2 98%  Physical Exam Vitals and nursing note reviewed.  Constitutional:      General: She is not in acute distress.    Appearance: She is well-developed.  HENT:     Head: Normocephalic and atraumatic.  Eyes:     Pupils:  Pupils are equal, round, and reactive to light.  Cardiovascular:     Rate and Rhythm: Regular rhythm. Tachycardia present.     Heart sounds: Normal heart sounds. No murmur heard.    No friction rub.  Pulmonary:     Effort: Pulmonary effort is normal.     Breath sounds: Normal breath sounds. No wheezing or rales.  Abdominal:     General: Bowel sounds are normal. There is distension.     Palpations: Abdomen is soft.     Tenderness: There is abdominal tenderness. There is no guarding or rebound.  Musculoskeletal:        General: No tenderness. Normal range of motion.     Right lower leg: Edema present.  Left lower leg: Edema present.     Comments: Pitting edema present bilaterally up to the knee,  Skin:    General: Skin is warm and dry.     Findings: No rash.  Neurological:     Mental Status: She is alert and oriented to person, place, and time. Mental status is at baseline.     Cranial Nerves: No cranial nerve deficit.  Psychiatric:        Mood and Affect: Mood normal.        Behavior: Behavior normal.     ED Results / Procedures / Treatments   Labs (all labs ordered are listed, but only abnormal results are displayed) Labs Reviewed  CBC - Abnormal; Notable for the following components:      Result Value   RBC 5.61 (*)    MCV 69.7 (*)    MCH 21.6 (*)    RDW 26.2 (*)    All other components within normal limits  COMPREHENSIVE METABOLIC PANEL - Abnormal; Notable for the following components:   Sodium 128 (*)    Chloride 95 (*)    CO2 20 (*)    Glucose, Bld 315 (*)    Calcium 8.5 (*)    Albumin 2.8 (*)    Alkaline Phosphatase 204 (*)    Total Bilirubin 4.1 (*)    All other components within normal limits  BRAIN NATRIURETIC PEPTIDE - Abnormal; Notable for the following components:   B Natriuretic Peptide 1,231.0 (*)    All other components within normal limits  PROTIME-INR - Abnormal; Notable for the following components:   Prothrombin Time 16.2 (*)    INR 1.3 (*)     All other components within normal limits  TROPONIN I (HIGH SENSITIVITY) - Abnormal; Notable for the following components:   Troponin I (High Sensitivity) 42 (*)    All other components within normal limits  TROPONIN I (HIGH SENSITIVITY) - Abnormal; Notable for the following components:   Troponin I (High Sensitivity) 45 (*)    All other components within normal limits  I-STAT BETA HCG BLOOD, ED ([facility], [facility], [facility] ONLY)    EKG EKG Interpretation  Date/Time:  Friday August 05 2022 11:54:56 EST Ventricular Rate:  111 PR Interval:  140 QRS Duration: 82 QT Interval:  346 QTC Calculation: 470 R Axis:   -62 Text Interpretation: Sinus tachycardia Left axis deviation Possible Anterior infarct , age undetermined No significant change since last tracing When compared with ECG of 24-Jun-2022 18:17, PREVIOUS ECG IS PRESENT Confirmed by Blanchie Dessert [phone]) on 08/05/2022 12:52:24 PM  Radiology DG Chest 2 View  Result Date: 08/05/2022 CLINICAL DATA:  Sob, chest pain EXAM: CHEST - 2 VIEW COMPARISON:  06/02/2022 FINDINGS: Cardiac silhouette is prominent. There is pulmonary interstitial prominence with vascular congestion. No focal consolidation. No pneumothorax or pleural effusion identified. IMPRESSION: Findings suggest CHF. Electronically Signed   By: Sammie Bench M.D.   On: 08/05/2022 12:45    Procedures Procedures    Medications Ordered in ED Medications  morphine (PF) 4 MG/ML injection 4 mg (has no administration in time range)  oxyCODONE-acetaminophen (PERCOCET/ROXICET) 5-325 MG per tablet 1 tablet (1 tablet Oral Given 08/05/22 1229)  ondansetron (ZOFRAN-ODT) disintegrating tablet 4 mg (4 mg Oral Given 08/05/22 1230)  furosemide (LASIX) injection 80 mg (80 mg Intravenous Given 08/05/22 1438)    ED Course/ Medical Decision Making/ A&P  Medical Decision Making Amount and/or Complexity of Data Reviewed Labs: ordered.  Risk Prescription drug  management.   Pt with multiple medical problems and comorbidities and presenting today with a complaint that caries a high risk for morbidity and mortality.  From her clinic due to worsening shortness of breath.  Concern for recurrent CHF exacerbation as patient has had orthopnea, DOE and a 20 pound weight gain.  She reports taking 80 mg of Lasix in the morning and 40 at night which she has been compliant with woke with someone on the phone yesterday who wanted her to increase the Lasix to 80 mg twice daily but she had the appointment today and reports they had not changed it.  She does have a history of DKA and history of abdominal pain and ongoing vomiting which was evaluated when she was last hospitalized.  She had an EGD that showed gastritis but no other acute pathology.  Lower suspicion for DKA or acute GI cause of her symptoms today.  No wheezing today to suggest asthma exacerbation.  I independently interpreted patient's EKG and labs.  EKG was sinus tachycardia but no evidence of A-fib or new changes.  BBC today with stable hemoglobin of 12 and normal white count, troponin is slightly better than baseline at 42 which is always chronically elevated, BNP today elevated at 1231 from her last 1 in the 400s and CMP today with preserved creatinine of 0.96 but new hyponatremia of 128 most likely related to fluid overload with normal LFTs and hyperglycemia with a blood sugar of 315.  Anion gap is 13 and low suspicion for DKA at this time. I have independently visualized and interpreted pt's images today.  Chest x-ray today with evidence of fluid overload. Feel that patient requires admission to the hospital and family practice team was consulted.  She was given IV Lasix. CRITICAL CARE Performed by: Mercie Balsley Total critical care time: 30 minutes Critical care time was exclusive of separately billable procedures and treating other patients. Critical care was necessary to treat or prevent imminent or  life-threatening deterioration. Critical care was time spent personally by me on the following activities: development of treatment plan with patient and/or surrogate as well as nursing, discussions with consultants, evaluation of patient's response to treatment, examination of patient, obtaining history from patient or surrogate, ordering and performing treatments and interventions, ordering and review of laboratory studies, ordering and review of radiographic studies, pulse oximetry and re-evaluation of patient's condition.           Final Clinical Impression(s) / ED Diagnoses Final diagnoses:  Acute on chronic congestive heart failure, unspecified heart failure type (HCC)  Nausea and vomiting, unspecified vomiting type    Rx / DC Orders ED Discharge Orders     None         Blanchie Dessert, MD 08/05/22 1554

## 2022-08-05 NOTE — ED Triage Notes (Signed)
Pt came in via POV from PCP office d/t him being concerned she may need diuresed from extra fluid. A/Ox4, endorses exertional SOB & some pain/discomfort in her legs, abd & chest.

## 2022-08-05 NOTE — Assessment & Plan Note (Addendum)
Stable -home metoprolol -Warfarin per pharmacy

## 2022-08-05 NOTE — Progress Notes (Signed)
Interim note:  Patient is a 47 year old female with history of congestive heart presenting today for worsening swelling.  She was recently seen in the hospital in December 2023 for worsening heart failure. Patient states taking her lasix as prescribed but reports multiple emesis daily and un able to keep her medication down.  Endorses BLE swelling, orthopnea at night and using multiple pillows to sleep for past 3 weeks. On exam she has +3 BLE edema up to her belly and endorses pain on her feet and pressure on her abdomen. My overall assessment is patient is volume overloaded secondary to HF likely because she's unable to diurese with her PO lasix given her issues with multiple emesis daily. I will send patient to the ED for diuresis and further work up. If she will eventual need admission, please ensure she it assigned to the Family Medicine team.  Alen Bleacher, MD PGY-2, [facility] Family Medicine Resident  Please page [phone] with questions.

## 2022-08-05 NOTE — ED Notes (Signed)
ED TO INPATIENT HANDOFF REPORT  ED Nurse Name and Phone #: Hal Hope [phone]  S Name/Age/Gender [name] 47 y.o. female Room/Bed: H012C/H012C  Code Status   Code Status: Full Code  Home/SNF/Other Home Patient oriented to: self, place, time, and situation Is this baseline? Yes   Triage Complete: Triage complete  Chief Complaint CHF exacerbation [facility]) [I50.9]  Triage Note Pt came in via [place] from PCP office d/t him being concerned she may need diuresed from extra fluid. A/Ox4, endorses exertional SOB & some pain/discomfort in her legs, abd & chest.   Allergies Allergies  Allergen Reactions   Fish Allergy Swelling   Shellfish Allergy Anaphylaxis, Swelling and Other (See Comments)    Airway involvement including EMS - Has Epi-Pen   Metformin And Related Diarrhea   Trulicity [Dulaglutide] Nausea And Vomiting and Other (See Comments)    Multiple episodes of vomiting over multiple days   Nsaids Other (See Comments)    Per PCP interferes with daily meds (heart failure)   Advil [Ibuprofen] Other (See Comments)    Per PCP interferes with daily meds (heart failure)    Level of Care/Admitting Diagnosis ED Disposition     ED Disposition  Admit   Condition  --   [place]: [place] [[id number]]  Level of Care: Med-Surg [16]  May place patient in observation at [facility] or [place] if equivalent level of care is available:: No  Covid Evaluation: Asymptomatic - no recent exposure (last 10 days) testing not required  Diagnosis: CHF exacerbation [facility]) [[id number]]  Admitting Physician: Lenoria Chime [[id number]]  Attending Physician: Lenoria Chime [[id number]]          B Medical/Surgery History Past Medical History:  Diagnosis Date   Abnormal Pap smear    Acute combined systolic and diastolic heart failure ([place]) 02/19/2019   Wt Readings from Last 3 Encounters:  09/18/21  84.7 kg  09/02/21  84.4 kg  08/31/21  84.2 kg         Acute  heart failure ([place]) 01/29/2022   Acute on chronic combined systolic and diastolic CHF (congestive heart failure) ([place]) 02/19/2019   Acute on chronic congestive heart failure (HCC)    Acute on chronic congestive heart failure (HCC)    Acute on chronic diastolic CHF (congestive heart failure) ([place]) 10/19/2021   Acute on chronic systolic heart failure (HCC) 02/22/2022   Anemia    Asthma    Atrial fibrillation (HCC)    history of paroxysmal atrial fibrillation   CHF (congestive heart failure) ([place]) 11/02/2021   CHF exacerbation ([place]) 10/17/2021   Diabetes mellitus    DKA, type 2 ([place]) 02/06/2019   Elevated troponin 09/30/2019   Heart failure with reduced ejection fraction ([place])    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     Migraines    Morbid obesity ([place]) 02/06/2019   Nausea & vomiting 06/26/2022   Nonischemic cardiomyopathy ([place]) 09/22/2021   NSTEMI (non-ST elevated myocardial infarction) ([place]) 02/23/2022   Obstructive sleep apnea 06/30/2015   Polycystic ovarian disease    Past Surgical History:  Procedure Laterality Date  BIOPSY  11/05/2021   Procedure: BIOPSY;  Surgeon: Daryel November, MD;  Location: [place];  Service: Gastroenterology;;   CERVIX LESION DESTRUCTION     CESAREAN SECTION     ESOPHAGOGASTRODUODENOSCOPY (EGD) WITH PROPOFOL N/A 11/05/2021   Procedure: ESOPHAGOGASTRODUODENOSCOPY (EGD) WITH PROPOFOL;  Surgeon: Daryel November, MD;  Location: [place];  Service: Gastroenterology;  Laterality: N/A;   RIGHT/LEFT HEART CATH AND CORONARY ANGIOGRAPHY N/A 03/29/2019   Procedure: RIGHT/LEFT HEART CATH  AND CORONARY ANGIOGRAPHY;  Surgeon: Larey Dresser, MD;  Location: [facility];  Service: Cardiovascular;  Laterality: N/A;   RIGHT/LEFT HEART CATH AND CORONARY ANGIOGRAPHY N/A 09/20/2021   Procedure: RIGHT/LEFT HEART CATH AND CORONARY ANGIOGRAPHY;  Surgeon: Larey Dresser, MD;  Location: [place] [facility];  Service: Cardiovascular;  Laterality: N/A;     A IV Location/Drains/Wounds Patient Lines/Drains/Airways Status     Active Line/Drains/Airways     Name Placement date Placement time Site Days   Peripheral IV 08/05/22 Left Antecubital 08/05/22  1437  Antecubital  less than 1            Intake/Output Last 24 hours No intake or output data in the 24 hours ending 08/05/22 1938  Labs/Imaging Results for orders placed or performed during the hospital encounter of 08/05/22 (from the past 48 hour(s))  Protime-INR     Status: Abnormal   Collection Time: 08/05/22 11:55 AM  Result Value Ref Range   Prothrombin Time 16.2 (H) 11.4 - 15.2 seconds   INR 1.3 (H) 0.8 - 1.2    Comment: (NOTE) INR goal varies based on device and disease states. Performed at [facility], [place] [address] [place], [place] [address]   CBC     Status: Abnormal   Collection Time: 08/05/22 12:30 PM  Result Value Ref Range   WBC 6.6 4.0 - 10.5 K/uL   RBC 5.61 (H) 3.87 - 5.11 MIL/uL   Hemoglobin 12.1 12.0 - 15.0 g/dL   HCT 39.1 36.0 - 46.0 %   MCV 69.7 (L) 80.0 - 100.0 fL   MCH 21.6 (L) 26.0 - 34.0 pg   MCHC 30.9 30.0 - 36.0 g/dL   RDW 26.2 (H) 11.5 - 15.5 %   Platelets 245 150 - 400 K/uL    Comment: REPEATED TO VERIFY   nRBC 0.0 0.0 - 0.2 %    Comment: Performed at [facility], [place] [address] [place], [place] [address]  Troponin I (High Sensitivity)     Status: Abnormal   Collection Time: 08/05/22 12:30 PM  Result Value Ref Range   Troponin I (High Sensitivity) 42 (H) <18 ng/L    Comment: (NOTE) Elevated high sensitivity troponin I (hsTnI) values and significant  changes  across serial measurements may suggest ACS but many other  chronic and acute conditions are known to elevate hsTnI results.  Refer to the "Links" section for chest pain algorithms and additional  guidance. Performed at [facility], [place] [address] [place], [place] [address]   Comprehensive metabolic panel     Status: Abnormal   Collection Time: 08/05/22 12:30 PM  Result Value Ref Range   Sodium 128 (L) 135 - 145 mmol/L   Potassium 4.2 3.5 - 5.1 mmol/L   Chloride 95 (L) 98 - 111 mmol/L   CO2 20 (L) 22 - 32 mmol/L   Glucose, Bld 315 (H) 70 - 99 mg/dL    Comment: Glucose reference range applies only to samples taken after  fasting for at least 8 hours.   BUN 13 6 - 20 mg/dL   Creatinine, Ser 0.96 0.44 - 1.00 mg/dL   Calcium 8.5 (L) 8.9 - 10.3 mg/dL   Total Protein 7.4 6.5 - 8.1 g/dL   Albumin 2.8 (L) 3.5 - 5.0 g/dL   AST 34 15 - 41 U/L   ALT 13 0 - 44 U/L   Alkaline Phosphatase 204 (H) 38 - 126 U/L   Total Bilirubin 4.1 (H) 0.3 - 1.2 mg/dL   GFR, Estimated >60 >60 mL/min    Comment: (NOTE) Calculated using the CKD-EPI Creatinine Equation (2021)    Anion gap 13 5 - 15    Comment: Performed at [facility], [place] [address], [place], [place] [address]  I-Stat beta hCG blood, ED     Status: None   Collection Time: 08/05/22  1:47 PM  Result Value Ref Range   I-stat hCG, quantitative <5.0 <5 mIU/mL   Comment 3            Comment:   GEST. AGE      CONC.  (mIU/mL)   <=1 WEEK        5 - 50     2 WEEKS       50 - 500     3 WEEKS       100 - 10,000     4 WEEKS     1,000 - 30,000        FEMALE AND NON-PREGNANT FEMALE:     LESS THAN 5 mIU/mL   Troponin I (High Sensitivity)     Status: Abnormal   Collection Time: 08/05/22  2:51 PM  Result Value Ref Range   Troponin I (High Sensitivity) 45 (H) <18 ng/L    Comment: (NOTE) Elevated high sensitivity troponin I (hsTnI) values and significant  changes across serial measurements may suggest ACS but many other  chronic and acute  conditions are known to elevate hsTnI results.  Refer to the "Links" section for chest pain algorithms and additional  guidance. Performed at [facility], [place] [address] [place], [place] [address]   Brain natriuretic peptide     Status: Abnormal   Collection Time: 08/05/22  2:52 PM  Result Value Ref Range   B Natriuretic Peptide 1,231.0 (H) 0.0 - 100.0 pg/mL    Comment: Performed at [facility] [address], [place], [place] [address]  CBG monitoring, ED     Status: Abnormal   Collection Time: 08/05/22  7:13 PM  Result Value Ref Range   Glucose-Capillary 246 (H) 70 - 99 mg/dL    Comment: Glucose reference range applies only to samples taken after fasting for at least 8 hours.   DG Chest 2 View  Result Date: 08/05/2022 CLINICAL DATA:  Sob, chest pain EXAM: CHEST - 2 VIEW COMPARISON:  06/02/2022 FINDINGS: Cardiac silhouette is prominent. There is pulmonary interstitial prominence with vascular congestion. No focal consolidation. No pneumothorax or pleural effusion identified. IMPRESSION: Findings suggest CHF. Electronically Signed   By: Sammie Bench M.D.   On: 08/05/2022 12:45    Pending Labs Unresulted Labs (From admission, onward)     Start     Ordered   08/06/22 6720  Basic metabolic panel  Tomorrow morning,   R        08/05/22 1658   08/06/22 0500  CBC  Tomorrow morning,   R        08/05/22 1658  08/06/22 0500  Protime-INR  Daily,   R      08/05/22 1814            Vitals/Pain Today's Vitals   08/05/22 1207 08/05/22 1208 08/05/22 1632 08/05/22 1852  BP: (!) 163/130  (!) 152/126 (!) 141/119  Pulse: (!) 110  (!) 110 100  Resp: 17  20 (!) 22  Temp: 97.9 F (36.6 C)  97.9 F (36.6 C) 97.9 F (36.6 C)  TempSrc:    Oral  SpO2: 98%  99% 100%  PainSc:  9  10-Worst pain ever     Isolation Precautions No active isolations  Medications Medications  atorvastatin (LIPITOR) tablet 40 mg (has no administration in time range)  losartan (COZAAR)  tablet 50 mg (has no administration in time range)  spironolactone (ALDACTONE) tablet 12.5 mg (has no administration in time range)  hydrOXYzine (ATARAX) tablet 10 mg (has no administration in time range)  sertraline (ZOLOFT) tablet 50 mg (has no administration in time range)  traZODone (DESYREL) tablet 50 mg (has no administration in time range)  empagliflozin (JARDIANCE) tablet 10 mg (has no administration in time range)  insulin glargine-yfgn (SEMGLEE) injection 15 Units (has no administration in time range)  prochlorperazine (COMPAZINE) injection 5 mg (has no administration in time range)  warfarin (COUMADIN) tablet 12 mg (has no administration in time range)  Warfarin - Pharmacist Dosing Inpatient (has no administration in time range)  insulin aspart (novoLOG) injection 0-15 Units (has no administration in time range)  enoxaparin (LOVENOX) injection 90 mg (has no administration in time range)  acetaminophen (TYLENOL) tablet 650 mg (has no administration in time range)  oxyCODONE-acetaminophen (PERCOCET/ROXICET) 5-325 MG per tablet 1 tablet (1 tablet Oral Given 08/05/22 1229)  ondansetron (ZOFRAN-ODT) disintegrating tablet 4 mg (4 mg Oral Given 08/05/22 1230)  furosemide (LASIX) injection 80 mg (80 mg Intravenous Given 08/05/22 1438)  morphine (PF) 4 MG/ML injection 4 mg (4 mg Intravenous Given 08/05/22 1631)    Mobility walks Low fall risk   Focused Assessments Shortness of breath related to acute on chronic CHF   R Recommendations: See Admitting Provider Note  Report given to:   Additional Notes: Pt A/Ox4 and ambulatory

## 2022-08-05 NOTE — Assessment & Plan Note (Addendum)
Unclear etiology; differential includes T2DM gatroparesis and abm edema 2/2 CHF. Previously seen GI 10/2021 for N/V workup, EGD at the time was unremarkable. From chart review, it looks like the next step was to pursue gastric emptying testing to workup gastroparesis. Pt has also trialed Metoclopramide most recently in 06/2022 admission, but pt is agreeable to trialing it again.  - Start Metoclopramide '5mg'$  IV QID (administer 109mn prior to meals and at bedtime) - PO Compazine every 6 hours as needed - Outpt GI f/u. Needs gastric emptying

## 2022-08-05 NOTE — Assessment & Plan Note (Addendum)
Patient with HFrEF, last echo in 02/2022 showing EF less than 20%.  CXR shows vascular congestion, patient has significant edema of BLEs and swelling of abdomen.  Weight gain of approximately 15 kg since last admission.  SpO2 in high 90s on room air.  She is s/p Lasix 80 mg IV in the ED -Admit to Med-tele, family medicine teaching service, attending Dr. Thompson Grayer -Reorder IV Lasix in the morning -Continue current medications including spironolactone, Jardiance -Hold metoprolol, consider restarting after successful diuresis -Strict I's and O's -Daily weights -Continuous pulse ox at least over night -am BMP

## 2022-08-05 NOTE — Assessment & Plan Note (Addendum)
Medication includes Lantus 15 units daily, NovoLog 12 units before dinner, Jardiance 10 mg daily.  A1c 06/02/2022 of 15.5.  -Semglee 15 units daily -mSSI  -CBGs 4 times daily

## 2022-08-05 NOTE — Progress Notes (Addendum)
ANTICOAGULATION CONSULT NOTE - Initial Consult  Pharmacy Consult for warfarin  Indication:  LV Thrombus  and afib   Allergies  Allergen Reactions   Fish Allergy Swelling   Shellfish Allergy Anaphylaxis, Swelling and Other (See Comments)    Airway involvement including EMS - Has Epi-Pen   Metformin And Related Diarrhea   Trulicity [Dulaglutide] Nausea And Vomiting and Other (See Comments)    Multiple episodes of vomiting over multiple days   Nsaids Other (See Comments)    Per PCP interferes with daily meds (heart failure)   Advil [Ibuprofen] Other (See Comments)    Per PCP interferes with daily meds (heart failure)    Vital Signs: Temp: 97.9 F (36.6 C) (01/12 1207) BP: 152/126 (01/12 1632) Pulse Rate: 110 (01/12 1632)  Labs: Recent Labs    08/05/22 1155 08/05/22 1230 08/05/22 1451  HGB  --  12.1  --   HCT  --  39.1  --   PLT  --  245  --   LABPROT 16.2*  --   --   INR 1.3*  --   --   CREATININE  --  0.96  --   TROPONINIHS  --  42* 45*    Estimated Creatinine Clearance: 73.2 mL/min (by C-G formula based on SCr of 0.96 mg/dL).   Medical History: Past Medical History:  Diagnosis Date   Abnormal Pap smear    Acute combined systolic and diastolic heart failure ([place]) 02/19/2019   Wt Readings from Last 3 Encounters:  09/18/21  84.7 kg  09/02/21  84.4 kg  08/31/21  84.2 kg         Acute heart failure ([place]) 01/29/2022   Acute on chronic combined systolic and diastolic CHF (congestive heart failure) ([place]) 02/19/2019   Acute on chronic congestive heart failure (HCC)    Acute on chronic congestive heart failure (HCC)    Acute on chronic diastolic CHF (congestive heart failure) ([place]) 10/19/2021   Acute on chronic systolic heart failure ([place]) 02/22/2022   Anemia    Asthma    Atrial fibrillation ([place])    history of paroxysmal atrial fibrillation   CHF (congestive heart failure) ([place]) 11/02/2021   CHF exacerbation ([place]) 10/17/2021   Diabetes mellitus    DKA, type 2 ([place])  02/06/2019   Elevated troponin 09/30/2019   Heart failure with reduced ejection fraction ([place])    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     Migraines    Morbid obesity ([place]) 02/06/2019   Nausea & vomiting 06/26/2022   Nonischemic cardiomyopathy ([place]) 09/22/2021   NSTEMI (non-ST elevated myocardial infarction) ([place]) 02/23/2022   Obstructive sleep apnea 06/30/2015   Polycystic ovarian disease      Assessment: Patient presenting to ED with concern for fluid overload. On warfarin PTA for history of AV thrombus and afib. Patient reports last dose 1/10. Pharmacy consulted to dose warfarin.   Patient reported home regimen: 11.25 mg daily except for Sunday take 7.5 mg   INR today 1.3 subtherapeutic. Hgb 12.1 and plt 245. No s/sx bleeding noted.    Goal of Therapy:  INR 2-3 Monitor platelets by anticoagulation  protocol: Yes   Plan:  Give warfarin 12 mg PO x1  Obtain daily INR and CBC  Consider lovenox bridge for subtherapeutic INR    PM Addendum:   Patient reports N/V in past several days unable to take oral warfarin. Will start lovenox 90 mg q12h for bridge to therapeutic warfarin.    Gena Fray, PharmD PGY1 Pharmacy Resident   08/05/2022 6:13 PM

## 2022-08-05 NOTE — ED Provider Triage Note (Signed)
Emergency Medicine Provider Triage Evaluation Note  CHANNELL QUATTRONE , a 47 y.o. female  was evaluated in triage.  Pt complains of chest pain, tightness, generalized abdominal pain, exertional shortness of breath, pain, discomfort in the legs, fluid swelling.  Patient with history of obesity, CHF, diabetes, she reports that she is taking Lasix 80 mg in the morning and 40 at night, reports that she has been nauseous and has not had difficulty tolerating her medicines although is try to take them as normal.  Review of Systems  Positive: Chest pain, leg swelling, abdominal pain, nausea, weakness, shob Negative: Fever, chills  Physical Exam  BP (!) 163/130   Pulse (!) 110   Temp 97.9 F (36.6 C)   Resp 17   LMP 05/07/2022   SpO2 98%  Gen:   Awake, no distress   Resp:  Normal effort  MSK:   Moves extremities without difficulty  Other:  2+ pitting edema , swelling, tightness of abdomen, generalized TTP of abdomen, tachycardia with normal rate  Medical Decision Making  Medically screening exam initiated at 12:18 PM.  Appropriate orders placed.  CHARLOTT CALVARIO was informed that the remainder of the evaluation will be completed by another provider, this initial triage assessment does not replace that evaluation, and the importance of remaining in the ED until their evaluation is complete.  Workup initiated   Anselmo Pickler, [place] 08/05/22 1220

## 2022-08-05 NOTE — H&P (Addendum)
Hospital Admission History and Physical Service Pager: [phone]  Patient name: [name] Medical record number: [id number] Date of Birth: April 12, 1976 Age: 47 y.o. Gender: female  Primary Care Provider: Donney Dice, DO Consultants: none Code Status: Full Preferred Emergency Contact: [name]     Name Relation Home Work Mobile   [name] Daughter   [phone]   [name]   [phone]        Chief Complaint: Shortness of breath  Assessment and Plan: [name] is a 47 y.o. female presenting with worsening shortness of breath BLE and abdominal swelling companied with nausea and vomiting likely due to HFrEF exacerbation. Differential for this patient's presentation of this includes ACS (unlikely, trops flat and EKG without signs of MI), DKA (blood sugar in 300s with no acidosis or AG).  CHF exacerbation ([name]) Patient with HFrEF, last echo in 02/2022 showing EF less than 20%.  CXR shows vascular congestion, patient has significant edema of BLEs and swelling of abdomen.  Weight gain of approximately 15 kg since last admission.  SpO2 in high 90s on room air.  She is s/p Lasix 80 mg IV in the ED -Admit to Med-tele, family medicine teaching service, attending Dr. Thompson [name] -Reorder IV Lasix in the morning -Continue current medications including spironolactone, Jardiance -Hold metoprolol, consider restarting after successful diuresis -Strict I's and O's -Daily weights -Continuous pulse ox at least over night -am BMP    Nausea and vomiting Patient has nausea vomiting associate with abdominal pain.  Could be related to abdominal swelling secondary to CHF exacerbation, it did improve after last hospitalization with diuresis.  Could also consider gastroparesis related to T2DM.  Of note EGD performed in April 2023 which showed no acute abnormalities.  Patient states Zofran does not control symptoms. -IV Compazine  Paroxysmal atrial fibrillation  (HCC) Not currently in afib.  -Lovenox bridge and warfarin per pharmacy  Poorly controlled type 2 diabetes mellitus ([place]) Medication includes Lantus 15 units daily, NovoLog 12 units before dinner, Jardiance 10 mg daily.  A1c 06/02/2022 of 15.5.  -Semglee 15 units daily -mSSI  -CBGs 4 times daily  Hypertension associated with diabetes ([place]) Home medications include losartan 50 mg daily, metoprolol succinate 25 mg daily.  BP in 150s-160s/120s-130s. -Continue losartan 50 mg daily -Hold metoprolol in setting of CHF exacerbation   Chronic and stable conditions Hyperlipidemia-continue atorvastatin OSA-CPAP nightly History of LV thrombus-continue warfarin per pharmacy Anemia of chronic disease Depression and anxiety-continue Zoloft and Atarax as needed Insomnia-pending trazodone at bedtime  FEN/GI: Heart healthy carb modified VTE Prophylaxis: Lovenox bridge and warfarin per pharmacy  Disposition: Med telemetry  History of Present Illness:  [name] is a 47 y.o. female presenting with shortness of breath, central chest pain, BLE swelling, and abdominal swelling for past couple weeks.  Denies cough, rhinorrhea and congestion.  Admits to nausea and vomiting since last admission (discharged 12/8).  She states her symptoms are similar to last admission where she was treated for heart failure exacerbation, symptoms did improve but did not completely resolve and then worsened.  She states she has not been able to keep food down, lately has not been able to keep water down.  Has been vomiting about 5 times a day.  Has been taking home medications but likely vomits them up.  States she has been taking Lasix 80 mg in the morning and 40 mg in the evening.  Also admits to taking her other heart failure medications including spironolactone  and Jardiance.  Currently has appointment scheduled with cardiology on 1/15.  Patient was seen at [facility] today by [name] due to swelling, pain, shortness  of breath and was advised to come to the emergency room for treatment of likely heart failure exacerbation.  In the ED, patient was tachycardic with SpO2 in high 90s on room air.  Troponin 42> 45 and EKG without signs of MI. CXR consistent with CHF showing vascular congestion and BNP significantly elevated at 1231.  She was given Percocet 5-325 mg for pain and Lasix 80 mg IV.  Review Of Systems: Per HPI   Pertinent Past Medical History: Paroxysmal A-fib-on Warfarin CAD-history NSTEMI T2DM  HTN Asthma History LV thrombus Depression and anxiety  Remainder reviewed in history tab  Pertinent Past Surgical History: EGD 10/2021 Right and left heart cath and coronary angiography in 2020 and 2023 Remainder reviewed in history tab.  Pertinent Social History: Tobacco use: No Alcohol use: No Other Substance use: No Lives with - did not ask, will ask later.   Pertinent Family History: Diabetes  hypertension  Remainder reviewed in history tab.   Important Outpatient Medications: No current facility-administered medications on file prior to encounter.   Current Outpatient Medications on File Prior to Encounter  Medication Sig Dispense Refill   Accu-Chek Softclix Lancets lancets Use as instructed 100 each 12   Accu-Chek Softclix Lancets lancets Use up 4 (four) times daily as directed 100 each 0   acetaminophen (TYLENOL) 500 MG tablet Take 500-1,000 mg by mouth every 6 (six) hours as needed for mild pain or headache.     atorvastatin (LIPITOR) 40 MG tablet TAKE 1 TABLET BY MOUTH DAILY AT 6 PM. (Patient taking differently: Take 40 mg by mouth every Monday, Wednesday, and Friday.) 30 tablet 2   blood glucose meter kit and supplies Use up to 4 (four) times daily as directed 1 each 0   empagliflozin (JARDIANCE) 10 MG TABS tablet Take 1 tablet (10 mg total) by mouth daily. 30 tablet 0   ferrous sulfate 325 (65 FE) MG tablet Take 1 tablet (325 mg total) by mouth daily with breakfast. 30 tablet 0    furosemide (LASIX) 80 MG tablet Take 1 tablet (80 mg total) by mouth 2 (two) times daily. 60 tablet 0   glucose blood (ONETOUCH VERIO) test strip Use up to 4 (four) times daily as directed 100 each 0   hydrOXYzine (ATARAX) 10 MG tablet Take 1 tablet (10 mg total) by mouth 3 (three) times daily as needed. (Patient taking differently: Take 10 mg by mouth 3 (three) times daily as needed for anxiety.) 90 tablet 3   insulin aspart (NOVOLOG FLEXPEN) 100 UNIT/ML FlexPen Inject 12 Units into the skin daily before supper. 15 mL 0   insulin glargine (LANTUS) 100 UNIT/ML Solostar Pen Inject 15 Units into the skin daily. 15 mL 0   Insulin Pen Needle (UNIFINE PENTIPS) 31G X 5 MM MISC USE AS DIRECTED 2 TIMES DAILY 100 each 11   losartan (COZAAR) 50 MG tablet Take 1 tablet (50 mg total) by mouth daily. 30 tablet 0   metoprolol succinate (TOPROL-XL) 25 MG 24 hr tablet Take 1 tablet (25 mg total) by mouth daily. 30 tablet 0   ondansetron (ZOFRAN-ODT) 4 MG disintegrating tablet Dissolve 1 tablet (4 mg total) by mouth every 8 (eight) hours as needed for nausea or vomiting. 20 tablet 0   oxyCODONE (OXY IR/ROXICODONE) 5 MG immediate release tablet Take 1 tablet (5 mg total)  by mouth every 6 (six) hours as needed for moderate pain. 20 tablet 0   polyethylene glycol (MIRALAX / GLYCOLAX) 17 g packet Take 17 g by mouth daily as needed for mild constipation. (Patient not taking: Reported on 06/25/2022) 14 each 0   senna-docusate (SENOKOT-S) 8.6-50 MG tablet Take 1 tablet by mouth 2 (two) times daily. (Patient not taking: Reported on 06/25/2022) 60 tablet 2   senna-docusate (SENOKOT-S) 8.6-50 MG tablet Take 1 tablet by mouth at bedtime as needed for moderate constipation. 30 tablet 0   sertraline (ZOLOFT) 50 MG tablet Take 50 mg by mouth daily.     spironolactone (ALDACTONE) 25 MG tablet Take 0.5 tablets (12.5 mg total) by mouth daily. 30 tablet 0   traZODone (DESYREL) 50 MG tablet Take 1 tablet (50 mg total) by mouth at  bedtime. 30 tablet 0   warfarin (COUMADIN) 7.5 MG tablet Take 1-2 tablets (7.5-15 mg total) by mouth daily as directed by [facility] 40 tablet 0   [DISCONTINUED] carvedilol (COREG) 3.125 MG tablet Take 1 tablet (3.125 mg total) by mouth 2 (two) times daily with a meal. 60 tablet 2     Remainder reviewed in medication history.   Objective: BP (!) 141/119   Pulse 100   Temp 97.9 F (36.6 C) (Oral)   Resp (!) 22   LMP 05/07/2022 Comment: irreg LMP  SpO2 100%  Exam: General: 47 year old obese female, NAD Eyes: White sclera, clear conjunctiva Cardiovascular: Tachycardic, regular rhythm Respiratory: Slightly tachypneic, CTAB Gastrointestinal: Bowel sounds present, soft, distended, diffuse tenderness to palpation with no guarding Extremities: Pitting edema of BLEs up to knee Derm: Warm and dry Neuro: Alert, no focal deficits Psych: Mood and affect appropriate for situation  Labs:  CBC BMET  Recent Labs  Lab 08/05/22 1230  WBC 6.6  HGB 12.1  HCT 39.1  PLT 245   Recent Labs  Lab 08/05/22 1230  NA 128*  K 4.2  CL 95*  CO2 20*  BUN 13  CREATININE 0.96  GLUCOSE 315*  CALCIUM 8.5*     EKG: My own interpretation (not copied from electronic read) sinus tachycardia   Imaging Studies Performed:  Imaging Study (ie. Chest x-ray) Impression from Radiologist: Vascular congestion  Precious Gilding, DO 08/05/2022, 7:43 PM PGY-1, [place] Intern pager: [phone], text pages welcome Secure chat group [place]

## 2022-08-05 NOTE — Progress Notes (Signed)
    SUBJECTIVE:   CHIEF COMPLAINT / HPI:   Patient is a 47 year old female presnting for BLE and abdominal swelling She is on lasix '80mg'$  in morning and '40mg'$  at night. Endorses compliance to her home meds Has been having emesis x4-5 daily for  3 weeks ago and not able to keep meds down  Reports chest pain described as Sharp pressure-like chest pain that's constant at rest and with activity.  Patient does reports she can't sleep flat at night and this has been going on for weeks. She have SOB with laying down and is requiring multiple pillows at night to sleep Reports decreased voiding which she said may be  about 4 times a day.   PERTINENT  PMH / PSH: Reviewed  OBJECTIVE:   BP 120/88   Pulse (!) 111   Ht '4\' 11"'$  (1.499 m)   Wt 199 lb (90.3 kg)   LMP 05/07/2022   SpO2 99%   BMI 40.19 kg/m    Physical Exam General: Alert, anxious appearing, NAD Cardiovascular: RRR, No Murmurs, Normal S2/S2 Respiratory: CTAB, No wheezing or Rales Abdomen: Mild tenderness, distended  Extremities: +3 BLE pitting edema   ASSESSMENT/PLAN:   Chronic combined systolic and diastolic heart failure (HCC) Patient's history and exam findings are consistent with volume overloaded likely secondary to CHF exacerbation. On exam she has +3 bilateral lower extremity edema up to her abdomen and found to be 20 pound heavier from last hospitalization few weeks ago. While patient endorses compliance to her Lasix of 80 mg in the morning and 40 mg at night, I suspect her current presentation is likely because she's unable to diuresis adequately due to her reported emesis and not being able to keep her Lasix down.  Given her current presentation I believe the patient will benefit from further workup and close inpatient management.  Advised patient to go to the ED, explained the need for inpatient management.  Patient verbalized understanding and amenable to plan.  Spoke with the ED charge nurse that I will be sending patient  over and notified the Family Medicine inpatient team.     Alen Bleacher, MD [place]

## 2022-08-05 NOTE — Progress Notes (Addendum)
FMTS Interim Progress Note  S:Night rounded w/ Dr. Nancy Fetter. Pt reports still having trouble breathing. Says she has had minimal urine output because she hasn't eaten or had anything to drink today.  O: BP (!) 141/119   Pulse 100   Temp 97.9 F (36.6 C) (Oral)   Resp (!) 22   LMP 05/07/2022 Comment: irreg LMP  SpO2 100%   Gen: Laying in bed, NAD. Speaking in full sentences.  Resp: Normal WOB on RA  A/P: CHF exacc - satting well on RA - Monitor UOP ON, redose lasix in AM if needed.  [name], MD 08/05/2022, 9:28 PM PGY-1, [place] Medicine Service pager [phone]

## 2022-08-05 NOTE — Patient Instructions (Signed)
It was wonderful to meet you today. Thank you for allowing me to be a part of your care. Below is a short summary of what we discussed at your visit today:  I am concerned exacerbation.  Will need to be sent you  to the hospital for diuresis and to give fluid out of your body and for futher work up.  I have called the ED to let them know you are coming.  Please bring all of your medications to every appointment!  If you have any questions or concerns, please do not hesitate to contact us via phone or MyChart message.   Alen Bleacher, MD [facility]

## 2022-08-06 DIAGNOSIS — Z6835 Body mass index (BMI) 35.0-35.9, adult: Secondary | ICD-10-CM | POA: Diagnosis not present

## 2022-08-06 DIAGNOSIS — Z794 Long term (current) use of insulin: Secondary | ICD-10-CM | POA: Diagnosis not present

## 2022-08-06 DIAGNOSIS — Z91013 Allergy to seafood: Secondary | ICD-10-CM | POA: Diagnosis not present

## 2022-08-06 DIAGNOSIS — E1169 Type 2 diabetes mellitus with other specified complication: Secondary | ICD-10-CM | POA: Diagnosis present

## 2022-08-06 DIAGNOSIS — F32A Depression, unspecified: Secondary | ICD-10-CM | POA: Diagnosis present

## 2022-08-06 DIAGNOSIS — G4733 Obstructive sleep apnea (adult) (pediatric): Secondary | ICD-10-CM | POA: Diagnosis present

## 2022-08-06 DIAGNOSIS — F419 Anxiety disorder, unspecified: Secondary | ICD-10-CM | POA: Diagnosis present

## 2022-08-06 DIAGNOSIS — Z7984 Long term (current) use of oral hypoglycemic drugs: Secondary | ICD-10-CM | POA: Diagnosis not present

## 2022-08-06 DIAGNOSIS — E785 Hyperlipidemia, unspecified: Secondary | ICD-10-CM | POA: Diagnosis present

## 2022-08-06 DIAGNOSIS — E871 Hypo-osmolality and hyponatremia: Secondary | ICD-10-CM | POA: Diagnosis present

## 2022-08-06 DIAGNOSIS — I152 Hypertension secondary to endocrine disorders: Secondary | ICD-10-CM | POA: Diagnosis present

## 2022-08-06 DIAGNOSIS — I509 Heart failure, unspecified: Secondary | ICD-10-CM

## 2022-08-06 DIAGNOSIS — I2510 Atherosclerotic heart disease of native coronary artery without angina pectoris: Secondary | ICD-10-CM | POA: Diagnosis present

## 2022-08-06 DIAGNOSIS — I252 Old myocardial infarction: Secondary | ICD-10-CM | POA: Diagnosis not present

## 2022-08-06 DIAGNOSIS — E669 Obesity, unspecified: Secondary | ICD-10-CM | POA: Diagnosis present

## 2022-08-06 DIAGNOSIS — J45909 Unspecified asthma, uncomplicated: Secondary | ICD-10-CM | POA: Diagnosis present

## 2022-08-06 DIAGNOSIS — Z79899 Other long term (current) drug therapy: Secondary | ICD-10-CM | POA: Diagnosis not present

## 2022-08-06 DIAGNOSIS — G4700 Insomnia, unspecified: Secondary | ICD-10-CM | POA: Diagnosis present

## 2022-08-06 DIAGNOSIS — R112 Nausea with vomiting, unspecified: Secondary | ICD-10-CM | POA: Diagnosis present

## 2022-08-06 DIAGNOSIS — I5043 Acute on chronic combined systolic (congestive) and diastolic (congestive) heart failure: Secondary | ICD-10-CM | POA: Diagnosis present

## 2022-08-06 DIAGNOSIS — E1165 Type 2 diabetes mellitus with hyperglycemia: Secondary | ICD-10-CM | POA: Diagnosis present

## 2022-08-06 DIAGNOSIS — Z888 Allergy status to other drugs, medicaments and biological substances status: Secondary | ICD-10-CM | POA: Diagnosis not present

## 2022-08-06 DIAGNOSIS — I480 Paroxysmal atrial fibrillation: Secondary | ICD-10-CM | POA: Diagnosis present

## 2022-08-06 DIAGNOSIS — R109 Unspecified abdominal pain: Secondary | ICD-10-CM | POA: Diagnosis present

## 2022-08-06 DIAGNOSIS — D638 Anemia in other chronic diseases classified elsewhere: Secondary | ICD-10-CM | POA: Diagnosis present

## 2022-08-06 LAB — CBC
HCT: 37.5 % (ref 36.0–46.0)
Hemoglobin: 11.9 g/dL — ABNORMAL LOW (ref 12.0–15.0)
MCH: 21.8 pg — ABNORMAL LOW (ref 26.0–34.0)
MCHC: 31.7 g/dL (ref 30.0–36.0)
MCV: 68.7 fL — ABNORMAL LOW (ref 80.0–100.0)
Platelets: 225 10*3/uL (ref 150–400)
RBC: 5.46 MIL/uL — ABNORMAL HIGH (ref 3.87–5.11)
RDW: 26 % — ABNORMAL HIGH (ref 11.5–15.5)
WBC: 7.6 10*3/uL (ref 4.0–10.5)
nRBC: 0 % (ref 0.0–0.2)

## 2022-08-06 LAB — GLUCOSE, CAPILLARY
Glucose-Capillary: 179 mg/dL — ABNORMAL HIGH (ref 70–99)
Glucose-Capillary: 167 mg/dL — ABNORMAL HIGH (ref 70–99)
Glucose-Capillary: 83 mg/dL (ref 70–99)
Glucose-Capillary: 160 mg/dL — ABNORMAL HIGH (ref 70–99)

## 2022-08-06 LAB — BASIC METABOLIC PANEL
Anion gap: 11 (ref 5–15)
BUN: 11 mg/dL (ref 6–20)
CO2: 23 mmol/L (ref 22–32)
Calcium: 8.6 mg/dL — ABNORMAL LOW (ref 8.9–10.3)
Chloride: 98 mmol/L (ref 98–111)
Creatinine, Ser: 0.8 mg/dL (ref 0.44–1.00)
GFR, Estimated: >60 mL/min (ref >60)
Glucose, Bld: 232 mg/dL — ABNORMAL HIGH (ref 70–99)
Potassium: 3.6 mmol/L (ref 3.5–5.1)
Sodium: 132 mmol/L — ABNORMAL LOW (ref 135–145)

## 2022-08-06 LAB — PROTIME-INR
INR: 1.4 — ABNORMAL HIGH (ref 0.8–1.2)
Prothrombin Time: 16.8 seconds — ABNORMAL HIGH (ref 11.4–15.2)

## 2022-08-06 MED ORDER — FUROSEMIDE 10 MG/ML IJ SOLN
80.0000 mg | Freq: Two times a day (BID) | INTRAMUSCULAR | Status: DC
  Administered 2022-08-06 – 2022-08-08 (×5): 80 mg via INTRAVENOUS
  Filled 2022-08-06 (×5): qty 8

## 2022-08-06 MED ORDER — WARFARIN SODIUM 10 MG PO TABS
11.2500 mg | ORAL_TABLET | Freq: Once | ORAL | Status: AC
  Administered 2022-08-06: 11.25 mg via ORAL
  Filled 2022-08-06: qty 1

## 2022-08-06 MED ORDER — MORPHINE SULFATE (PF) 2 MG/ML IV SOLN
1.0000 mg | Freq: Once | INTRAVENOUS | Status: AC
  Administered 2022-08-06: 1 mg via INTRAVENOUS
  Filled 2022-08-06: qty 1

## 2022-08-06 NOTE — Progress Notes (Signed)
Daily Progress Note Intern Pager: [phone]  Patient name: [name] Medical record number: [id number] Date of birth: 02/02/1976 Age: 47 y.o. Gender: female  Primary Care Provider: Donney Dice, DO Consultants: none Code Status: full  Pt Overview and Major Events to Date:  1/12 admitted  Assessment and Plan:  [name] is a 47 y.o. female p/w SOB and anasarca as well as N/V in the setting of acute on chronic heart failure exacerbation.   Pertinent PMH/PSH includes PAF on warfarin, hx of LV thrombus, CAD, T2DM on insulin, HTN, Asthma  CHF exacerbation (HCC) Hx HFrEF, EF <20% (02/2022). Pulmonary vascular congestion and significant volume overload on admission, with recent hx of weight gain. At home, takes Lasix '80mg'$  in the morning and '40mg'$  in the evening. Currently undergoing IV diuresis, vital signs are stable on room air. -IV Lasix '80mg'$  BID -Cardiac monitoring -Continue home spironolactone, Jardiance -Hold metoprolol, consider restarting after successful diuresis -Strict I's and O's -Daily weights -Continuous pulse ox at least over night -am BMP -PT/OT    Nausea and vomiting Patient has nausea vomiting associate with abdominal pain. Could be related to abdominal swelling secondary to CHF exacerbation, it did improve after last hospitalization with diuresis.Also has leg pain in the setting of severe swelling.    Could also consider gastroparesis related to T2DM; patient states she had poor reaction to Reglan in the past.  Of note EGD performed in April 2023 which showed no acute abnormalities.  Patient states Zofran does not control symptoms. -IV Compazine prn -1 time dose of IV morphine '1mg'$  for severe abdominal and leg pain this morning, would hold off on further narcotics  Paroxysmal atrial fibrillation (HCC) Stable -Holding metoprolol due to CHF exacerbation -Warfarin per pharmacy  Poorly controlled type 2 diabetes mellitus ([place]) Medication includes  Lantus 15 units daily, NovoLog 12 units before dinner, Jardiance 10 mg daily.  A1c 06/02/2022 of 15.5.  -Semglee 15 units daily -mSSI  -CBGs 4 times daily  Hypertension associated with diabetes ([place]) Home medications include losartan 50 mg daily, metoprolol succinate 25 mg daily.  BP in 150s-160s/120s-130s. -Continue losartan 50 mg daily -Hold metoprolol in setting of CHF exacerbation       FEN/GI: heart healthy PPx: warfarin per pharmacy Dispo:pending continued diuresis as well as PT/OT eval  Subjective:  Having 9/10 pain this morning, mostly in her legs but also in her lower abdomen. Associated w/ nausea. Feels like her breathing is a little better. Denies CP.  Objective: Temp:  [97.8 F (36.6 C)-98.3 F (36.8 C)] 97.8 F (36.6 C) (01/13 1053) Pulse Rate:  [99-110] 99 (01/13 1053) Resp:  [18-22] 18 (01/13 1053) BP: (134-157)/(103-126) 143/113 (01/13 1053) SpO2:  [96 %-100 %] 96 % (01/13 1053) Weight:  [88.4 kg-88.9 kg] 88.4 kg (01/13 0347) Physical Exam: General: NAD, in bed Cardiovascular: mildly tachycardic, regular rhythm Respiratory: bibasilar crackles, otherwise normal WOB on RA Abdomen: soft, non-distended, moderately tender to palpation diffusely but more in lower quadrants, no guarding Extremities: 3+ pitting edema b/l lower extremities   Laboratory: Most recent CBC Lab Results  Component Value Date   WBC 7.6 08/06/2022   HGB 11.9 (L) 08/06/2022   HCT 37.5 08/06/2022   MCV 68.7 (L) 08/06/2022   PLT 225 08/06/2022   Most recent BMP    Latest Ref Rng & Units 08/06/2022   12:29 AM  BMP  Glucose 70 - 99 mg/dL 232   BUN 6 - 20 mg/dL 11   Creatinine  0.44 - 1.00 mg/dL 0.80   Sodium 135 - 145 mmol/L 132   Potassium 3.5 - 5.1 mmol/L 3.6   Chloride 98 - 111 mmol/L 98   CO2 22 - 32 mmol/L 23   Calcium 8.9 - 10.3 mg/dL 8.6      Tammy Albino, MD 08/06/2022, 12:32 PM  PGY-1, [place] Intern pager: [phone], text pages  welcome Secure chat group [place]

## 2022-08-06 NOTE — Hospital Course (Signed)
[name] is a 47 y.o.female with a history of chronic N/V, afib, T2DM, HFrEF (EF<20%) who was admitted to the [place] [facility] for CHF Exacerbation. Her hospital course is detailed below:  CHF exacerbation  HFrEF (EF <20%) Presented in fluid overload and increased WOB in setting of inability to take home Lasix 2/2 N/V (discussed below). Pulm edema noted on CXR. BNP 1231. Was stable on room air and did not require supplemental O2 during entire admission. Received IV Lasix 80 BID with improvement in her volume status. Pt remained on home spironolactone, jardiance, and metoprolol. Pt was discharged on PO Lasix 80 BID w/ good UOP. Admission wt 196lbs; Wt at discharge is 174.5lb (this may NOT represent dry weight as pt still had some trace edema on discharge).   To prevent another CHF exac due to N/V, pt was advised to take prn Compazine, then reach out to PCP if unable to take home Lasix for >24hrs.   Acute Exacerbation of Chronic Nausea/vomiting  abdominal pain P/w nausea/vomiting as well as abdominal and leg pain. Likely multifactoral including poorly controled T2DM and volume overload. Did receive IV morphine intermittently for this. Also treated w/ compazine and Reglan for N/V. At discharge, pt still having N/V but improved since admission and pt was discharged w/ short course of Reglan and prn compazine.   Per chart review, pt was following outpt with GI to workup for gastroparesis. Pt needs to follow-up outpt w/ GI to complete a gastric emptying study.   Paroxysmal AF Remained stable. Pt cont home home metop and warfarin.   Other chronic conditions were medically managed with home medications and formulary alternatives as necessary:  T2DM: Continued insulin HTN: continued losartan  PCP Follow-up Recommendations: May need adjustment of insulin. Pt glucose was managed w/ Semglee 15u daily and SSI w/ meals. However, pt also had decreased PO while admitted. A1c elevated to  >15.5. Needs outpt follow-up w/ GI for gastric emptying study and further workup for gastroparesis.  Consider increasing Sertraline for better control of mood. Ensure pt has a back plan in place to get Lasix in case N/V prevents her from taking PO.

## 2022-08-06 NOTE — Progress Notes (Signed)
Pt refuses CPAP for tonight and states she does not wear at home.

## 2022-08-06 NOTE — Evaluation (Signed)
Physical Therapy Evaluation Patient Details Name: [name] MRN: [id number] DOB: 03-18-1976 Today's Date: 08/06/2022  History of Present Illness  [name] is a 47 y.o. female presenting with worsening shortness of breath, BLE and abdominal swelling accompanied with nausea and vomiting likely due to HFrEF exacerbation. PMH: afib on warfarin, CAD, T2DM, HTN, asthma, depression, anxiety   Clinical Impression  Pt admitted with above. Pt received a dose of morphine for abdominal and bilat LE pain 30 minutes prior to PT eval. Pt did have instability with ambulation however most likely due to pt being "loopy" from morphine. Pt was able to toliet herself on [facility] upon return. Suspect pt will progress well and return to baseline once pain under control. Acute PT to cont to monitor pt.        Recommendations for follow up therapy are one component of a multi-disciplinary discharge planning process, led by the attending physician.  Recommendations may be updated based on patient status, additional functional criteria and insurance authorization.  Follow Up Recommendations No PT follow up      Assistance Recommended at Discharge Intermittent Supervision/Assistance  Patient can return home with the following       Equipment Recommendations None recommended by PT  Recommendations for Other Services       Functional Status Assessment Patient has had a recent decline in their functional status and demonstrates the ability to make significant improvements in function in a reasonable and predictable amount of time.     Precautions / Restrictions Precautions Precautions: Fall Precaution Comments: pt was "loopy" on morphine Restrictions Weight Bearing Restrictions: No      Mobility  Bed Mobility Overal bed mobility: Modified Independent             General bed mobility comments: HOB elevated to 40 deg, increased time,    Transfers Overall transfer level: Needs  assistance Equipment used: 1 person hand held assist Transfers: Sit to/from Stand Sit to Stand: Min guard           General transfer comment: min guard due to feeling loopy from morphine, gave HHA, slow guarded, wide base of support    Ambulation/Gait Ambulation/Gait assistance: Min guard, Min assist, +2 physical assistance (bilat HHA) Gait Distance (Feet): 75 Feet Assistive device: 2 person hand held assist Gait Pattern/deviations: Step-through pattern, Decreased stride length, Wide base of support Gait velocity: dec compared to baseline Gait velocity interpretation: 1.31 - 2.62 ft/sec, indicative of limited community ambulator   General Gait Details: pt with lateral sway and heavy eye lids however pt did receive morphine 30 min prior to PT eval, bilat HHA given for stability, suspect pt would amb fine if she didn't just have morphine  Stairs            Wheelchair Mobility    Modified Rankin (Stroke Patients Only)       Balance Overall balance assessment: Mild deficits observed, not formally tested (due to recent dose of morphine pt with noted lateral sway however suspect this to be due to medicine)                                           Pertinent Vitals/Pain      Home Living Family/patient expects to be discharged to:: Private residence Living Arrangements: Alone Available Help at Discharge: Family;Available PRN/intermittently Type of Home: House Home Access: Stairs to enter  Entrance Stairs-Number of Steps: 1 Alternate Level Stairs-Number of Steps: flight Home Layout: Two level;1/2 bath on main level Home Equipment: Hand held shower head Additional Comments: in the process of getting a handicapped accessible apartment    Prior Function Prior Level of Function : Independent/Modified Independent;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[employer])             Mobility Comments: no AD ADLs Comments: indep     Hand Dominance   Dominant  Hand: Right    Extremity/Trunk Assessment   Upper Extremity Assessment Upper Extremity Assessment: Generalized weakness    Lower Extremity Assessment Lower Extremity Assessment: Generalized weakness    Cervical / Trunk Assessment Cervical / Trunk Assessment: Normal  Communication   Communication: No difficulties  Cognition Arousal/Alertness: Awake/alert (but sleepy/loopy from recent dose of morphine) Behavior During Therapy: WFL for tasks assessed/performed Overall Cognitive Status: Within Functional Limits for tasks assessed                                 General Comments: pt aware she is "loopy" from the morphine        General Comments General comments (skin integrity, edema, etc.): bilat LE edema, abdominal edema, pt with high BP- Map of 112, pt reports "its always hight"    Exercises     Assessment/Plan    PT Assessment Patient needs continued PT services  PT Problem List Decreased strength;Decreased activity tolerance;Decreased balance;Decreased mobility;Decreased knowledge of use of DME       PT Treatment Interventions DME instruction;Gait training;Stair training;Functional mobility training;Therapeutic activities;Therapeutic exercise;Balance training    PT Goals (Current goals can be found in the Care Plan section)  Acute Rehab PT Goals Patient Stated Goal: stop the pain PT Goal Formulation: With patient Time For Goal Achievement: 08/20/22 Potential to Achieve Goals: Good    Frequency Min 3X/week     Co-evaluation               AM-PAC PT "6 Clicks" Mobility  Outcome Measure Help needed turning from your back to your side while in a flat bed without using bedrails?: None Help needed moving from lying on your back to sitting on the side of a flat bed without using bedrails?: None Help needed moving to and from a bed to a chair (including a wheelchair)?: None Help needed standing up from a chair using your arms (e.g., wheelchair or  bedside chair)?: A Little Help needed to walk in hospital room?: A Little Help needed climbing 3-5 steps with a railing? : A Little 6 Click Score: 21    End of Session Equipment Utilized During Treatment: Gait belt Activity Tolerance: Patient limited by lethargy Patient left: in bed;with call bell/phone within reach;with bed alarm set Nurse Communication: Mobility status PT Visit Diagnosis: Unsteadiness on feet (R26.81);Muscle weakness (generalized) (M62.81);Difficulty in walking, not elsewhere classified (R26.2)    Time: 3007-6226 PT Time Calculation (min) (ACUTE ONLY): 14 min   Charges:   PT Evaluation $PT Eval Low Complexity: 1 Low          Kittie Plater, PT, DPT Acute Rehabilitation Services Secure chat preferred Office #: [phone]   Berline Lopes 08/06/2022, 2:15 PM

## 2022-08-06 NOTE — Progress Notes (Signed)
[place] for warfarin  Indication:  LV Thrombus  and afib   Allergies  Allergen Reactions   Fish Allergy Swelling   Shellfish Allergy Anaphylaxis, Swelling and Other (See Comments)    Airway involvement including EMS - Has Epi-Pen   Metformin And Related Diarrhea   Trulicity [Dulaglutide] Nausea And Vomiting and Other (See Comments)    Multiple episodes of vomiting over multiple days   Nsaids Other (See Comments)    Per PCP interferes with daily meds (heart failure)   Advil [Ibuprofen] Other (See Comments)    Per PCP interferes with daily meds (heart failure)    Vital Signs: Temp: 98.3 F (36.8 C) (01/13 0716) Temp Source: Oral (01/13 0716) BP: 134/103 (01/13 0716) Pulse Rate: 104 (01/13 0716)  Labs: Recent Labs    08/05/22 1155 08/05/22 1230 08/05/22 1451 08/06/22 0029  HGB  --  12.1  --  11.9*  HCT  --  39.1  --  37.5  PLT  --  245  --  225  LABPROT 16.2*  --   --  16.8*  INR 1.3*  --   --  1.4*  CREATININE  --  0.96  --  0.80  TROPONINIHS  --  42* 45*  --      Estimated Creatinine Clearance: 86.8 mL/min (by C-G formula based on SCr of 0.8 mg/dL).   Medical History: Past Medical History:  Diagnosis Date   Abnormal Pap smear    Acute combined systolic and diastolic heart failure ([place]) 02/19/2019   Wt Readings from Last 3 Encounters:  09/18/21  84.7 kg  09/02/21  84.4 kg  08/31/21  84.2 kg         Acute heart failure ([place]) 01/29/2022   Acute on chronic combined systolic and diastolic CHF (congestive heart failure) ([place]) 02/19/2019   Acute on chronic congestive heart failure (HCC)    Acute on chronic congestive heart failure (HCC)    Acute on chronic diastolic CHF (congestive heart failure) ([place]) 10/19/2021   Acute on chronic systolic heart failure ([place]) 02/22/2022   Anemia    Asthma    Atrial fibrillation ([place])    history of paroxysmal atrial fibrillation   CHF (congestive heart failure) ([place]) 11/02/2021   CHF  exacerbation ([place]) 10/17/2021   Diabetes mellitus    DKA, type 2 ([place]) 02/06/2019   Elevated troponin 09/30/2019   Heart failure with reduced ejection fraction ([place])    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     Migraines    Morbid obesity ([place]) 02/06/2019   Nausea & vomiting 06/26/2022   Nonischemic cardiomyopathy ([place]) 09/22/2021   NSTEMI (non-ST elevated myocardial infarction) ([place]) 02/23/2022   Obstructive sleep apnea 06/30/2015   Polycystic ovarian disease      Assessment: Patient presenting to ED with concern for fluid overload. On warfarin PTA for history of AV thrombus and afib. Patient reports last dose 1/10 2/2 significant N/V PTA. Pharmacy consulted to dose warfarin.   Patient reported home regimen: 11.25 mg daily except for Sunday take 7.5 mg   INR today 1.4 subtherapeutic. Increased from INR  of 1.3 yesterday. Patient received first inpatient warfarin dose yesterday (1/12) at 2133. Hgb 11.9 and plt 225 from this AM stable. No s/sx bleeding noted in chart. Patient on lovenox bridge while subtherapeutic on warfarin.    Goal of Therapy:  INR 2-3 Monitor platelets by anticoagulation protocol: Yes   Plan:  Give warfarin 11.25 mg PO x1  Obtain daily INR and CBC  Continue bridge with enoxaparin 90 mg q12h    Gena Fray, PharmD PGY1 Pharmacy Resident   08/06/2022 8:16 AM

## 2022-08-06 NOTE — Progress Notes (Signed)
Patient refused use of CPAP for the evening, stating that she does not wear one at home.

## 2022-08-06 NOTE — Progress Notes (Addendum)
OT Cancellation Note  Patient Details Name: KIMMERLY LORA MRN: [id number] DOB: Jun 09, 1976   Cancelled Treatment:    Reason Eval/Treat Not Completed: OT screened, no needs identified, will sign off (Gave patient information for energy conservation techniques.) Pt independent with ADL and mobility. No acute OT needs at this time.  Jefferey Pica, OTR/L Acute Rehabilitation Services Office: [phone]  Jaycub Noorani  C 08/06/2022, 2:07 PM

## 2022-08-07 DIAGNOSIS — I509 Heart failure, unspecified: Secondary | ICD-10-CM | POA: Diagnosis not present

## 2022-08-07 LAB — BASIC METABOLIC PANEL
Anion gap: 11 (ref 5–15)
BUN: 9 mg/dL (ref 6–20)
CO2: 26 mmol/L (ref 22–32)
Calcium: 8.7 mg/dL — ABNORMAL LOW (ref 8.9–10.3)
Chloride: 98 mmol/L (ref 98–111)
Creatinine, Ser: 0.9 mg/dL (ref 0.44–1.00)
GFR, Estimated: >60 mL/min (ref >60)
Glucose, Bld: 108 mg/dL — ABNORMAL HIGH (ref 70–99)
Potassium: 3.5 mmol/L (ref 3.5–5.1)
Sodium: 135 mmol/L (ref 135–145)

## 2022-08-07 LAB — CBC
HCT: 38.3 % (ref 36.0–46.0)
Hemoglobin: 12 g/dL (ref 12.0–15.0)
MCH: 21.7 pg — ABNORMAL LOW (ref 26.0–34.0)
MCHC: 31.3 g/dL (ref 30.0–36.0)
MCV: 69.3 fL — ABNORMAL LOW (ref 80.0–100.0)
Platelets: 216 10*3/uL (ref 150–400)
RBC: 5.53 MIL/uL — ABNORMAL HIGH (ref 3.87–5.11)
RDW: 26 % — ABNORMAL HIGH (ref 11.5–15.5)
WBC: 6.7 10*3/uL (ref 4.0–10.5)
nRBC: 0 % (ref 0.0–0.2)

## 2022-08-07 LAB — GLUCOSE, CAPILLARY
Glucose-Capillary: 145 mg/dL — ABNORMAL HIGH (ref 70–99)
Glucose-Capillary: 137 mg/dL — ABNORMAL HIGH (ref 70–99)
Glucose-Capillary: 140 mg/dL — ABNORMAL HIGH (ref 70–99)
Glucose-Capillary: 127 mg/dL — ABNORMAL HIGH (ref 70–99)
Glucose-Capillary: 142 mg/dL — ABNORMAL HIGH (ref 70–99)

## 2022-08-07 LAB — PROTIME-INR
INR: 1.6 — ABNORMAL HIGH (ref 0.8–1.2)
Prothrombin Time: 18.4 seconds — ABNORMAL HIGH (ref 11.4–15.2)

## 2022-08-07 MED ORDER — ACETAMINOPHEN 500 MG PO TABS
1000.0000 mg | ORAL_TABLET | Freq: Four times a day (QID) | ORAL | Status: DC
  Administered 2022-08-07 – 2022-08-09 (×9): 1000 mg via ORAL
  Filled 2022-08-07 (×9): qty 2

## 2022-08-07 MED ORDER — PROCHLORPERAZINE EDISYLATE 10 MG/2ML IJ SOLN: 5.0000 mg | Freq: Four times a day (QID) | INTRAMUSCULAR | Status: DC | PRN

## 2022-08-07 MED ORDER — CYCLOBENZAPRINE HCL 5 MG PO TABS
2.5000 mg | ORAL_TABLET | Freq: Two times a day (BID) | ORAL | Status: DC | PRN
  Administered 2022-08-07: 2.5 mg via ORAL
  Filled 2022-08-07: qty 1

## 2022-08-07 MED ORDER — METOPROLOL SUCCINATE ER 25 MG PO TB24
25.0000 mg | ORAL_TABLET | Freq: Every day | ORAL | Status: DC
  Administered 2022-08-07 – 2022-08-09 (×3): 25 mg via ORAL
  Filled 2022-08-07 (×3): qty 1

## 2022-08-07 MED ORDER — PROCHLORPERAZINE EDISYLATE 10 MG/2ML IJ SOLN
5.0000 mg | Freq: Two times a day (BID) | INTRAMUSCULAR | Status: DC
  Administered 2022-08-07 – 2022-08-08 (×2): 5 mg via INTRAVENOUS
  Filled 2022-08-07 (×2): qty 2

## 2022-08-07 MED ORDER — CYCLOBENZAPRINE HCL 5 MG PO TABS: 2.5000 mg | ORAL_TABLET | Freq: Two times a day (BID) | ORAL | Status: DC | PRN

## 2022-08-07 MED ORDER — WARFARIN SODIUM 10 MG PO TABS
11.2500 mg | ORAL_TABLET | Freq: Once | ORAL | Status: AC
  Administered 2022-08-07: 11.25 mg via ORAL
  Filled 2022-08-07: qty 1

## 2022-08-07 NOTE — Progress Notes (Deleted)
Office Visit    Patient Name: [name] Date of Encounter: 08/07/2022  PCP:  Donney Dice, [facility]  Cardiologist:  Loralie Champagne, MD *** Advanced Practice Provider:  No care team member to display Electrophysiologist:  Will Meredith Leeds, MD  {Press F2 to show EP APP, CHF, sleep or structural heart MD               :[id number]  { Click here to update then REFRESH NOTE - MD (PCP) or APP (Team Member)  Change PCP Type for MD, Specialty for APP is either Cardiology or Clinical Cardiac Electrophysiology  :[id number]  Chief Complaint    [name] is a 47 y.o. female presents today for ***   Past Medical History    Past Medical History:  Diagnosis Date   Abnormal Pap smear    Acute combined systolic and diastolic heart failure ([place]) 02/19/2019   Wt Readings from Last 3 Encounters:  09/18/21  84.7 kg  09/02/21  84.4 kg  08/31/21  84.2 kg         Acute heart failure ([place]) 01/29/2022   Acute on chronic combined systolic and diastolic CHF (congestive heart failure) ([place]) 02/19/2019   Acute on chronic congestive heart failure ([place])    Acute on chronic congestive heart failure ([place])    Acute on chronic diastolic CHF (congestive heart failure) ([place]) 10/19/2021   Acute on chronic systolic heart failure ([place]) 02/22/2022   Anemia    Asthma    Atrial fibrillation ([place])    history of paroxysmal atrial fibrillation   CHF (congestive heart failure) ([place]) 11/02/2021   CHF exacerbation ([place]) 10/17/2021   Diabetes mellitus    DKA, type 2 ([place]) 02/06/2019   Elevated troponin 09/30/2019   Heart failure with reduced ejection fraction ([place])    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  Consider chronic etiology given continued fertility issues   Hypertension    Hypertensive emergency 09/30/2019   Hypertensive urgency 06/02/2022   Major depressive disorder 02/08/2012   Reports that her  infertility is a major cause of her depression Reports Prozac has worked in the past for her.     Migraines    Morbid obesity ([place]) 02/06/2019   Nausea & vomiting 06/26/2022   Nonischemic cardiomyopathy ([place]) 09/22/2021   NSTEMI (non-ST elevated myocardial infarction) ([place]) 02/23/2022   Obstructive sleep apnea 06/30/2015   Polycystic ovarian disease    Past Surgical History:  Procedure Laterality Date   BIOPSY  11/05/2021   Procedure: BIOPSY;  Surgeon: Daryel November, MD;  Location: [facility] ENDOSCOPY;  Service: Gastroenterology;;   CERVIX LESION DESTRUCTION     CESAREAN SECTION     ESOPHAGOGASTRODUODENOSCOPY (EGD) WITH PROPOFOL N/A 11/05/2021   Procedure: ESOPHAGOGASTRODUODENOSCOPY (EGD) WITH PROPOFOL;  Surgeon: Daryel November, MD;  Location: [facility] ENDOSCOPY;  Service: Gastroenterology;  Laterality: N/A;   RIGHT/LEFT HEART CATH AND CORONARY ANGIOGRAPHY N/A 03/29/2019   Procedure: RIGHT/LEFT HEART CATH AND CORONARY ANGIOGRAPHY;  Surgeon: Larey Dresser, MD;  Location: [facility] CV LAB;  Service: Cardiovascular;  Laterality: N/A;   RIGHT/LEFT HEART CATH AND CORONARY ANGIOGRAPHY N/A 09/20/2021   Procedure: RIGHT/LEFT HEART CATH AND CORONARY ANGIOGRAPHY;  Surgeon: Larey Dresser, MD;  Location: [facility] CV LAB;  Service: Cardiovascular;  Laterality: N/A;    Allergies  Allergies  Allergen Reactions   Fish Allergy Swelling   Shellfish  Allergy Anaphylaxis, Swelling and Other (See Comments)    Airway involvement including EMS - Has Epi-Pen   Metformin And Related Diarrhea   Trulicity [Dulaglutide] Nausea And Vomiting and Other (See Comments)    Multiple episodes of vomiting over multiple days   Nsaids Other (See Comments)    Per PCP interferes with daily meds (heart failure)   Advil [Ibuprofen] Other (See Comments)    Per PCP interferes with daily meds (heart failure)    History of Present Illness    [name] is a 47 y.o. female with a hx of *** last seen  ***.   EKGs/Labs/Other Studies Reviewed:   The following studies were reviewed today: ***  EKG:  EKG is *** ordered today.  The ekg ordered today demonstrates ***  Recent Labs: 06/25/2022: TSH 1.887 07/01/2022: Magnesium 1.6 08/05/2022: ALT 13; B Natriuretic Peptide 1,231.0 08/07/2022: BUN 9; Creatinine, Ser 0.90; Hemoglobin 12.0; Platelets 216; Potassium 3.5; Sodium 135  Recent Lipid Panel    Component Value Date/Time   CHOL 222 (H) 09/18/2021 0328   CHOL 266 (H) 10/20/2017 1054   TRIG 97 09/18/2021 0328   HDL 43 09/18/2021 0328   HDL 43 10/20/2017 1054   CHOLHDL 5.2 09/18/2021 0328   VLDL 19 09/18/2021 0328   LDLCALC 160 (H) 09/18/2021 0328   LDLCALC 195 (H) 10/20/2017 1054   LDLDIRECT 154 (H) 01/20/2014 1522    Risk Assessment/Calculations:  {Does this patient have ATRIAL FIBRILLATION?:[phone]}  Home Medications   No outpatient medications have been marked as taking for the 08/08/22 encounter (Appointment) with Loel Dubonnet, NP.     Review of Systems   ***   All other systems reviewed and are otherwise negative except as noted above.  Physical Exam    VS:  LMP 05/07/2022 Comment: irreg LMP , BMI There is no height or weight on file to calculate BMI.  Wt Readings from Last 3 Encounters:  08/07/22 184 lb 4.8 oz (83.6 kg)  08/05/22 199 lb (90.3 kg)  06/24/22 189 lb (85.7 kg)     GEN: Well nourished, well developed, in no acute distress. HEENT: normal. Neck: Supple, no JVD, carotid bruits, or masses. Cardiac: ***RRR, no murmurs, rubs, or gallops. No clubbing, cyanosis, edema.  ***Radials/PT 2+ and equal bilaterally.  Respiratory:  ***Respirations regular and unlabored, clear to auscultation bilaterally. GI: Soft, nontender, nondistended. MS: No deformity or atrophy. Skin: Warm and dry, no rash. Neuro:  Strength and sensation are intact. Psych: Normal affect.  Assessment & Plan    ***         Disposition: Follow up {follow up:15908} with Loralie Champagne, MD or APP.  Signed, Loel Dubonnet, NP 08/07/2022, 3:03 PM [facility]

## 2022-08-07 NOTE — Progress Notes (Signed)
   08/07/22 0851  Assess: MEWS Score  Temp 97.7 F (36.5 C)  BP (!) 155/111  MAP (mmHg) 126  Pulse Rate (!) 115  ECG Heart Rate (!) 115  Resp 18  Level of Consciousness Alert  SpO2 99 %  O2 Device Nasal Cannula  Assess: MEWS Score  MEWS Temp 0  MEWS Systolic 0  MEWS Pulse 2  MEWS RR 0  MEWS LOC 0  MEWS Score 2  MEWS Score Color Yellow  Treat  Pain Scale 0-10  Pain Score 10  Pain Type Acute pain  Pain Location Abdomen  Pain Orientation Other (Comment) ("all over")  Pain Descriptors / Indicators Sore  Pain Frequency Constant  Pain Onset On-going  Pain Intervention(s) MD notified (Comment) (teaching service text paged)  Take Vital Signs  Increase Vital Sign Frequency  Yellow: Q 2hr X 2 then Q 4hr X 2, if remains yellow, continue Q 4hrs  Escalate  MEWS: Escalate Yellow: discuss with charge nurse/RN and consider discussing with provider and RRT  Notify: Charge Nurse/RN  Name of Charge Nurse/RN Notified Stanton Kidney, RN  Date Charge Nurse/RN Notified 08/07/22  Time Charge Nurse/RN Notified 0913  Provider Notification  Provider Name/Title [facility]  Date Provider Notified 08/07/22  Time Provider Notified [phone]  Method of Notification Page  Notification Reason Other (Comment) (pain/BP)  Provider response En route;See new orders  Date of Provider Response 08/07/22  Time of Provider Response [phone]  Document  Patient Outcome Other (Comment) (medication given per order; will reassess)  Progress note created (see row info) Yes  Assess: SIRS CRITERIA  SIRS Temperature  0  SIRS Pulse 1  SIRS Respirations  0  SIRS WBC 0  SIRS Score Sum  1

## 2022-08-07 NOTE — Progress Notes (Signed)
[place] for warfarin  Indication:  LV Thrombus  and afib   Allergies  Allergen Reactions   Fish Allergy Swelling   Shellfish Allergy Anaphylaxis, Swelling and Other (See Comments)    Airway involvement including EMS - Has Epi-Pen   Metformin And Related Diarrhea   Trulicity [Dulaglutide] Nausea And Vomiting and Other (See Comments)    Multiple episodes of vomiting over multiple days   Nsaids Other (See Comments)    Per PCP interferes with daily meds (heart failure)   Advil [Ibuprofen] Other (See Comments)    Per PCP interferes with daily meds (heart failure)    Vital Signs: Temp: 97.5 F (36.4 C) (01/14 0401) Temp Source: Oral (01/14 0401) BP: 160/123 (01/14 0401) Pulse Rate: 112 (01/14 0401)  Labs: Recent Labs    08/05/22 1155 08/05/22 1230 08/05/22 1230 08/05/22 1451 08/06/22 0029 08/07/22 0050  HGB  --  12.1   < >  --  11.9* 12.0  HCT  --  39.1  --   --  37.5 38.3  PLT  --  245  --   --  225 216  LABPROT 16.2*  --   --   --  16.8* 18.4*  INR 1.3*  --   --   --  1.4* 1.6*  CREATININE  --  0.96  --   --  0.80 0.90  TROPONINIHS  --  42*  --  45*  --   --    < > = values in this interval not displayed.     Estimated Creatinine Clearance: 74.8 mL/min (by C-G formula based on SCr of 0.9 mg/dL).   Medical History: Past Medical History:  Diagnosis Date   Abnormal Pap smear    Acute combined systolic and diastolic heart failure ([place]) 02/19/2019   Wt Readings from Last 3 Encounters:  09/18/21  84.7 kg  09/02/21  84.4 kg  08/31/21  84.2 kg         Acute heart failure ([place]) 01/29/2022   Acute on chronic combined systolic and diastolic CHF (congestive heart failure) ([place]) 02/19/2019   Acute on chronic congestive heart failure (HCC)    Acute on chronic congestive heart failure (HCC)    Acute on chronic diastolic CHF (congestive heart failure) ([place]) 10/19/2021   Acute on chronic systolic heart failure ([place]) 02/22/2022   Anemia     Asthma    Atrial fibrillation ([place])    history of paroxysmal atrial fibrillation   CHF (congestive heart failure) ([place]) 11/02/2021   CHF exacerbation ([place]) 10/17/2021   Diabetes mellitus    DKA, type 2 ([place]) 02/06/2019   Elevated troponin 09/30/2019   Heart failure with reduced ejection fraction ([place])    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     Migraines    Morbid obesity ([place]) 02/06/2019   Nausea & vomiting 06/26/2022   Nonischemic cardiomyopathy ([place]) 09/22/2021   NSTEMI (non-ST elevated myocardial infarction) ([place]) 02/23/2022   Obstructive sleep apnea 06/30/2015   Polycystic ovarian disease      Assessment: Patient presenting to ED with concern for fluid overload. On warfarin  PTA for history of AV thrombus and afib. Patient reports last dose 1/10 2/2 significant N/V PTA. Pharmacy consulted to dose warfarin.   Patient reported home regimen: 11.25 mg daily except for Sunday take 7.5 mg   INR today 1.6 subtherapeutic. Increased from INR of 1.4 yesterday. Hgb 12.0 and plt 216 from this AM stable. No s/sx bleeding noted in chart. Patient on lovenox bridge while subtherapeutic on warfarin.    Goal of Therapy:  INR 2-3 Monitor platelets by anticoagulation protocol: Yes   Plan:  Give warfarin 11.25 mg PO x1  Obtain daily INR and CBC  Continue bridge with enoxaparin 90 mg q12h    Gena Fray, PharmD PGY1 Pharmacy Resident   08/07/2022 8:35 AM

## 2022-08-07 NOTE — Progress Notes (Signed)
Daily Progress Note Intern Pager: [phone]  Patient name: [name] Medical record number: [id number] Date of birth: 1975-08-25 Age: 47 y.o. Gender: female  Primary Care Provider: Donney Dice, DO Consultants: None Code Status: Full  Pt Overview and Major Events to Date:  1/12- admitted  Assessment and Plan:  BICH MCHANEY is a 47 y.o. female p/w SOB and anasarca as well as N/V in the setting of acute on chronic heart failure exacerbation.  Pertinent PMH/PSH includes PAF on warfarin, hx of LV thrombus, CAD, T2DM on insulin, HTN, Asthma  CHF exacerbation (HCC) Hx HFrEF, EF <20% (02/2022).  UOP last 24 hours of 5.2 L and wt loss of about 5 kg. -IV Lasix '80mg'$  BID today, plan to switch to oral tomorrow -Cardiac monitoring -Continue home spironolactone, Jardiance -Strict I's and O's -Daily weights -Continuous pulse ox at least over night -am BMP -PT/OT    Nausea and vomiting Patient has nausea vomiting associate with abdominal pain. Could be related to abdominal swelling secondary to CHF exacerbation, it did improve after last hospitalization with diuresis.  Could also consider gastroparesis related to T2DM; patient states she had poor reaction to Reglan in the past.  Of note EGD performed in April 2023 which showed no acute abnormalities.  Patient states Zofran does not control symptoms. -IV Compazine scheduled twice daily and every 6 hours as needed   Paroxysmal atrial fibrillation (HCC) Stable -Restart home metoprolol -Warfarin per pharmacy  Poorly controlled type 2 diabetes mellitus ([place]) Home medication includes Lantus 15 units daily, NovoLog 12 units before dinner, Jardiance 10 mg daily.  A1c 06/02/2022 of 15.5. Yesterday, had 15 units semglee  and 2 aspart, ABG 140 this am. -Semglee 15 units daily -mSSI  -CBGs 4 times daily  Hypertension associated with diabetes ([place]) Home medications include losartan 50 mg daily, metoprolol succinate 25 mg daily.  BP in  150s-160s/120s-130s. -Continue losartan and restart home metoprolol today    FEN/GI: Heart healthy, carb modified  PPx: Warfarin Dispo: Pending continued medical management  Subjective:  Patient states her abdominal pain is severe, was not able to sleep well last night due to pain, is very frustrated that we will not give her anything besides Tylenol.  She does think the Compazine is helping with her nausea.  Objective: Temp:  [97.5 F (36.4 C)-97.9 F (36.6 C)] 97.9 F (36.6 C) (01/14 1243) Pulse Rate:  [90-115] 108 (01/14 1243) Resp:  [18-20] 18 (01/14 1243) BP: (143-160)/(106-123) 152/106 (01/14 1243) SpO2:  [94 %-100 %] 94 % (01/14 1243) Weight:  [83.6 kg] 83.6 kg (01/14 0341) Physical Exam: General: Patient sitting up, NAD Cardiovascular: Tachycardic, regular rhythm Respiratory: CTAB, normal effort Abdomen: Soft, nondistended, diffusely tender to palpation without guarding Extremities: 3+ pitting edema BLEs  Laboratory: Most recent CBC Lab Results  Component Value Date   WBC 6.7 08/07/2022   HGB 12.0 08/07/2022   HCT 38.3 08/07/2022   MCV 69.3 (L) 08/07/2022   PLT 216 08/07/2022   Most recent BMP    Latest Ref Rng & Units 08/07/2022   12:50 AM  BMP  Glucose 70 - 99 mg/dL 108   BUN 6 - 20 mg/dL 9   Creatinine 0.44 - 1.00 mg/dL 0.90   Sodium 135 - 145 mmol/L 135   Potassium 3.5 - 5.1 mmol/L 3.5   Chloride 98 - 111 mmol/L 98   CO2 22 - 32 mmol/L 26   Calcium 8.9 - 10.3 mg/dL 8.7  Precious Gilding, DO 08/07/2022, 1:18 PM  PGY-2, [place] Intern pager: [phone], text pages welcome Secure chat group [place]

## 2022-08-08 ENCOUNTER — Ambulatory Visit (HOSPITAL_BASED_OUTPATIENT_CLINIC_OR_DEPARTMENT_OTHER): Payer: Medicare Other | Admitting: Family

## 2022-08-08 DIAGNOSIS — R112 Nausea with vomiting, unspecified: Secondary | ICD-10-CM | POA: Diagnosis not present

## 2022-08-08 DIAGNOSIS — I509 Heart failure, unspecified: Secondary | ICD-10-CM | POA: Diagnosis not present

## 2022-08-08 LAB — BASIC METABOLIC PANEL
Anion gap: 12 (ref 5–15)
BUN: 9 mg/dL (ref 6–20)
CO2: 31 mmol/L (ref 22–32)
Calcium: 8.9 mg/dL (ref 8.9–10.3)
Chloride: 92 mmol/L — ABNORMAL LOW (ref 98–111)
Creatinine, Ser: 1.03 mg/dL — ABNORMAL HIGH (ref 0.44–1.00)
GFR, Estimated: >60 mL/min (ref >60)
Glucose, Bld: 96 mg/dL (ref 70–99)
Potassium: 3.2 mmol/L — ABNORMAL LOW (ref 3.5–5.1)
Sodium: 135 mmol/L (ref 135–145)

## 2022-08-08 LAB — GLUCOSE, CAPILLARY
Glucose-Capillary: 181 mg/dL — ABNORMAL HIGH (ref 70–99)
Glucose-Capillary: 124 mg/dL — ABNORMAL HIGH (ref 70–99)
Glucose-Capillary: 185 mg/dL — ABNORMAL HIGH (ref 70–99)

## 2022-08-08 LAB — CBC
HCT: 41.1 % (ref 36.0–46.0)
Hemoglobin: 12.6 g/dL (ref 12.0–15.0)
MCH: 21.2 pg — ABNORMAL LOW (ref 26.0–34.0)
MCHC: 30.7 g/dL (ref 30.0–36.0)
MCV: 69.3 fL — ABNORMAL LOW (ref 80.0–100.0)
Platelets: 253 10*3/uL (ref 150–400)
RBC: 5.93 MIL/uL — ABNORMAL HIGH (ref 3.87–5.11)
RDW: 26.3 % — ABNORMAL HIGH (ref 11.5–15.5)
WBC: 6.7 10*3/uL (ref 4.0–10.5)
nRBC: 0 % (ref 0.0–0.2)

## 2022-08-08 LAB — PROTIME-INR
INR: 2.3 — ABNORMAL HIGH (ref 0.8–1.2)
Prothrombin Time: 25 seconds — ABNORMAL HIGH (ref 11.4–15.2)

## 2022-08-08 MED ORDER — POTASSIUM CHLORIDE 20 MEQ PO PACK
40.0000 meq | PACK | Freq: Once | ORAL | Status: AC
  Administered 2022-08-08: 40 meq via ORAL
  Filled 2022-08-08: qty 2

## 2022-08-08 MED ORDER — WARFARIN SODIUM 5 MG PO TABS
10.0000 mg | ORAL_TABLET | Freq: Once | ORAL | Status: AC
  Administered 2022-08-08: 10 mg via ORAL
  Filled 2022-08-08: qty 2

## 2022-08-08 MED ORDER — POTASSIUM CHLORIDE 20 MEQ PO PACK: 40.0000 meq | PACK | Freq: Two times a day (BID) | ORAL | Status: DC

## 2022-08-08 MED ORDER — PROCHLORPERAZINE MALEATE 5 MG PO TABS: 5.0000 mg | ORAL_TABLET | Freq: Four times a day (QID) | ORAL | Status: DC | PRN

## 2022-08-08 MED ORDER — FUROSEMIDE 40 MG PO TABS
80.0000 mg | ORAL_TABLET | Freq: Two times a day (BID) | ORAL | Status: DC
  Administered 2022-08-08 – 2022-08-09 (×2): 80 mg via ORAL
  Filled 2022-08-08 (×2): qty 2

## 2022-08-08 MED ORDER — METOCLOPRAMIDE HCL 5 MG PO TABS
5.0000 mg | ORAL_TABLET | Freq: Three times a day (TID) | ORAL | Status: DC
  Administered 2022-08-08 – 2022-08-09 (×4): 5 mg via ORAL
  Filled 2022-08-08 (×4): qty 1

## 2022-08-08 NOTE — Plan of Care (Signed)
  Problem: Education: Goal: Ability to demonstrate management of disease process will improve Outcome: Progressing   Problem: Activity: Goal: Capacity to carry out activities will improve Outcome: Progressing   Problem: Cardiac: Goal: Ability to achieve and maintain adequate cardiopulmonary perfusion will improve Outcome: Progressing   Problem: Fluid Volume: Goal: Ability to maintain a balanced intake and output will improve Outcome: Progressing   Problem: Coping: Goal: Ability to adjust to condition or change in health will improve Outcome: Progressing   Problem: Health Behavior/Discharge Planning: Goal: Ability to manage health-related needs will improve Outcome: Progressing

## 2022-08-08 NOTE — Progress Notes (Signed)
   08/08/22 1236  Mobility  Activity Ambulated independently in hallway  Level of Assistance Independent  Assistive Device None  Distance Ambulated (ft) 150 ft  Activity Response Tolerated well  Mobility Referral Yes  $Mobility charge 1 Mobility   Mobility Specialist Progress Note  Pt was EOB and agreeable. X3 standing breaks d/t fatigue. Returned to bed w/ all needs met and call bell in reach.   Lucious Groves Mobility Specialist  Please contact via SecureChat or Rehab office at [phone]

## 2022-08-08 NOTE — TOC Progression Note (Signed)
Transition of Care [facility]) - Progression Note    Patient Details  Name: [name] MRN: [id number] Date of Birth: [birth year]  Transition of [facility]) CM/SW Contact  Zenon Mayo, RN Phone Number: 08/08/2022, 2:40 PM  Clinical Narrative:     from home alone, CHF EX,iv lasix.  TOC following.       Expected Discharge Plan and Services                                               Social Determinants of Health (SDOH) Interventions SDOH Screenings   Food Insecurity: No Food Insecurity (08/07/2022)  Housing: Low Risk  (08/07/2022)  Transportation Needs: Unmet Transportation Needs (08/07/2022)  Utilities: Not At Risk (08/07/2022)  Alcohol Screen: Low Risk  (08/19/2020)  Depression (PHQ2-9): High Risk (08/05/2022)  Financial Resource Strain: High Risk (10/17/2019)  Physical Activity: Insufficiently Active (08/19/2020)  Stress: Stress Concern Present (08/19/2020)  Tobacco Use: Low Risk  (08/05/2022)    Readmission Risk Interventions     No data to display

## 2022-08-08 NOTE — Progress Notes (Signed)
Patient states she was in pain and felt SHOB, VS WNL, gave medication and placed 2L O2 via nasal cannula for comfort. Will continue to monitor.

## 2022-08-08 NOTE — Plan of Care (Addendum)
  Problem: Education: Goal: Ability to demonstrate management of disease process will improve Outcome: Progressing Goal: Ability to verbalize understanding of medication therapies will improve Outcome: Progressing   Problem: Cardiac: Goal: Ability to achieve and maintain adequate cardiopulmonary perfusion will improve Outcome: Progressing

## 2022-08-08 NOTE — Progress Notes (Signed)
Daily Progress Note Intern Pager: [phone]  Patient name: [name] Medical record number: [id number] Date of birth: Jun 27, 1976 Age: 47 y.o. Gender: female  Primary Care Provider: Donney Dice, DO Consultants: None  Code Status: Full  Pt Overview and Major Events to Date:  1/12 Admitted  Assessment and Plan: [name] is a 47 y.o. female p/w SOB and anasarca as well as N/V in the setting of acute on chronic heart failure exacerbation.  Pertinent PMH/PSH includes PAF on warfarin, hx of LV thrombus, CAD, T2DM on insulin, HTN, Asthma   CHF exacerbation (HCC) Hx HFrEF, EF <20% (02/2022).  UOP 3.8L over last 24hrs. Wt now 174 lbs (prior lowest weights are ~175, so 174lb may be her new dry weight). Normal WOB on RA. Cr slightly uptrended. Pt overall euvolemic and ready to transition to PO lasix. - Start Lasix 80 PO BID. Stop IV lasix. - Repleted K - Cont home spiro, jardiance, metop - Strict I/O. Daily Weights. -Continuous pulse ox at least over night -am BMP -PT/OT   Nausea and vomiting Unclear etiology; differential includes T2DM gatroparesis and abm edema 2/2 CHF. Previously seen GI 10/2021 for N/V workup, EGD at the time was unremarkable. From chart review, it looks like the next step was to pursue gastric emptying testing to workup gastroparesis. Pt has also trialed Metoclopramide most recently in 06/2022 admission, but pt is agreeable to trialing it again.  - Start Metoclopramide '5mg'$  IV QID (administer 78mn prior to meals and at bedtime) - IV Compazine scheduled twice daily and every 6 hours as needed - Outpt GI f/u. Needs gastric emptying   Paroxysmal atrial fibrillation (HCC) Stable -home metoprolol -Warfarin per pharmacy  Poorly controlled type 2 diabetes mellitus ([place] Home medication includes Lantus 15 units daily, NovoLog 12 units before dinner, Jardiance 10 mg daily.  A1c 06/02/2022 of 15.5. Yesterday, had 15 units semglee  and 2 aspart, ABG 140 this  am. -Semglee 15 units daily -mSSI  -CBGs 4 times daily  Hypertension associated with diabetes ([place] Stably elevated -Cont home losartan and metop   FEN/GI: Heart healthy/carb modified PPx: Warfarin  Dispo:Home today. Barriers include inability to tolerate PO.   Subjective:  Reports having periods of feeling fine, then will have periods of feeling sick again. She is agreeable with trialing Reglan.   Objective: Temp:  [97.5 F (36.4 C)-98.6 F (37 C)] 97.9 F (36.6 C) (01/15 0513) Pulse Rate:  [91-109] 91 (01/15 0513) Resp:  [18-19] 19 (01/15 0513) BP: (131-156)/(104-112) 139/104 (01/15 0513) SpO2:  [94 %-100 %] 100 % (01/15 0513) Weight:  [79.2 kg] 79.2 kg (01/15 0513) Physical Exam: General: Alert, NAD.  Cardiovascular: RRR, no murmurs. Respiratory: CTAB, no crackles, wheezing. Normal WOB on RA. Abdomen: Diffusely uncomfortable to palpation. Soft, nondistended. Normal BS.   Laboratory: Most recent CBC Lab Results  Component Value Date   WBC 6.7 08/08/2022   HGB 12.6 08/08/2022   HCT 41.1 08/08/2022   MCV 69.3 (L) 08/08/2022   PLT 253 08/08/2022   Most recent BMP    Latest Ref Rng & Units 08/08/2022   12:31 AM  BMP  Glucose 70 - 99 mg/dL 96   BUN 6 - 20 mg/dL 9   Creatinine 0.44 - 1.00 mg/dL 1.03   Sodium 135 - 145 mmol/L 135   Potassium 3.5 - 5.1 mmol/L 3.2   Chloride 98 - 111 mmol/L 92   CO2 22 - 32 mmol/L 31   Calcium 8.9 - 10.3  mg/dL 8.9    Tammy Dice, MD 08/08/2022, 9:37 AM  PGY-1, [place] Intern pager: [phone], text pages welcome Secure chat group [place]

## 2022-08-08 NOTE — Progress Notes (Signed)
PT Cancellation Note  Patient Details Name: [name] MRN: [id number] DOB: 1975-11-14   Cancelled Treatment:    Reason Eval/Treat Not Completed: Other (comment).  Walked recently and then SOB but sats are 100%.  Retry as time and pt allow.   Ramond Dial 08/08/2022, 2:07 PM  Mee Hives, PT PhD Acute Rehab Dept. Number: [place] and [place]

## 2022-08-08 NOTE — Progress Notes (Signed)
ANTICOAGULATION CONSULT NOTE -Follow Up  Pharmacy Consult for warfarin  Indication:  LV Thrombus  and afib   Allergies  Allergen Reactions   Fish Allergy Swelling   Shellfish Allergy Anaphylaxis, Swelling and Other (See Comments)    Airway involvement including EMS - Has Epi-Pen   Metformin And Related Diarrhea   Trulicity [Dulaglutide] Nausea And Vomiting and Other (See Comments)    Multiple episodes of vomiting over multiple days   Nsaids Other (See Comments)    Per PCP interferes with daily meds (heart failure)   Advil [Ibuprofen] Other (See Comments)    Per PCP interferes with daily meds (heart failure)    Vital Signs: Temp: 97.9 F (36.6 C) (01/15 0513) Temp Source: Oral (01/15 0513) BP: 139/104 (01/15 0513) Pulse Rate: 91 (01/15 0513)  Labs: Recent Labs    08/05/22 1230 08/05/22 1451 08/06/22 0029 08/07/22 0050 08/08/22 0031  HGB 12.1  --  11.9* 12.0 12.6  HCT 39.1  --  37.5 38.3 41.1  PLT 245  --  225 216 253  LABPROT  --   --  16.8* 18.4* 25.0*  INR  --   --  1.4* 1.6* 2.3*  CREATININE 0.96  --  0.80 0.90 1.03*  TROPONINIHS 42* 45*  --   --   --      Estimated Creatinine Clearance: 63.4 mL/min (A) (by C-G formula based on SCr of 1.03 mg/dL (H)).   Medical History: Past Medical History:  Diagnosis Date   Abnormal Pap smear    Acute combined systolic and diastolic heart failure ([place]) 02/19/2019   Wt Readings from Last 3 Encounters:  09/18/21  84.7 kg  09/02/21  84.4 kg  08/31/21  84.2 kg         Acute heart failure ([place]) 01/29/2022   Acute on chronic combined systolic and diastolic CHF (congestive heart failure) ([place]) 02/19/2019   Acute on chronic congestive heart failure (HCC)    Acute on chronic congestive heart failure (HCC)    Acute on chronic diastolic CHF (congestive heart failure) ([place]) 10/19/2021   Acute on chronic systolic heart failure ([place]) 02/22/2022   Anemia    Asthma    Atrial fibrillation ([place])    history of paroxysmal atrial  fibrillation   CHF (congestive heart failure) ([place]) 11/02/2021   CHF exacerbation ([place]) 10/17/2021   Diabetes mellitus    DKA, type 2 ([place]) 02/06/2019   Elevated troponin 09/30/2019   Heart failure with reduced ejection fraction ([place])    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     Migraines    Morbid obesity ([place]) 02/06/2019   Nausea & vomiting 06/26/2022   Nonischemic cardiomyopathy ([place]) 09/22/2021   NSTEMI (non-ST elevated myocardial infarction) ([place]) 02/23/2022   Obstructive sleep apnea 06/30/2015   Polycystic ovarian disease      Assessment: Patient presenting to ED with concern for fluid overload. On warfarin PTA for history of AV thrombus and afib. Patient reports last dose 1/10 2/2 significant N/V PTA. Pharmacy consulted to dose warfarin.   Patient reported home regimen:11.25 mg daily except for Sunday take 7.5 mg   INR today  2.3 therapeutic, jumped from INR 1.6 yesterday. Hgb 12.6  and plt WNL from this AM stable. No s/sx bleeding noted in chart. Patient on lovenox bridge while subtherapeutic on warfarin. Patient continues to have episodes of N/V and poor appetite    Goal of Therapy:  INR 2-3 Monitor platelets by anticoagulation protocol: Yes   Plan:  Reduce warfarin 10 mg PO x1  Obtain daily INR and CBC  Continue lovenox bridge for 2 therapeutic INRs   Sandford Craze, PharmD. [facility] Acute Care PGY-1  08/08/2022 9:12 AM

## 2022-08-08 NOTE — Progress Notes (Signed)
Heart Failure Navigator Progress Note  Assessed for Heart & Vascular TOC clinic readiness.  Patient does not meet criteria due to dismissed from the Heart Failure clinic 07/01/2022..   Navigator will sign off at this time.    Earnestine Leys, BSN, Clinical cytogeneticist Only

## 2022-08-09 ENCOUNTER — Other Ambulatory Visit (HOSPITAL_COMMUNITY): Payer: Self-pay

## 2022-08-09 DIAGNOSIS — I509 Heart failure, unspecified: Secondary | ICD-10-CM | POA: Diagnosis not present

## 2022-08-09 LAB — CBC
HCT: 38.1 % (ref 36.0–46.0)
Hemoglobin: 12 g/dL (ref 12.0–15.0)
MCH: 21.7 pg — ABNORMAL LOW (ref 26.0–34.0)
MCHC: 31.5 g/dL (ref 30.0–36.0)
MCV: 69 fL — ABNORMAL LOW (ref 80.0–100.0)
Platelets: 222 10*3/uL (ref 150–400)
RBC: 5.52 MIL/uL — ABNORMAL HIGH (ref 3.87–5.11)
RDW: 26.1 % — ABNORMAL HIGH (ref 11.5–15.5)
WBC: 6.4 10*3/uL (ref 4.0–10.5)
nRBC: 0 % (ref 0.0–0.2)

## 2022-08-09 LAB — GLUCOSE, CAPILLARY
Glucose-Capillary: 109 mg/dL — ABNORMAL HIGH (ref 70–99)
Glucose-Capillary: 125 mg/dL — ABNORMAL HIGH (ref 70–99)
Glucose-Capillary: 81 mg/dL (ref 70–99)

## 2022-08-09 LAB — BASIC METABOLIC PANEL
Anion gap: 9 (ref 5–15)
BUN: 10 mg/dL (ref 6–20)
CO2: 31 mmol/L (ref 22–32)
Calcium: 8.6 mg/dL — ABNORMAL LOW (ref 8.9–10.3)
Chloride: 94 mmol/L — ABNORMAL LOW (ref 98–111)
Creatinine, Ser: 1.1 mg/dL — ABNORMAL HIGH (ref 0.44–1.00)
GFR, Estimated: >60 mL/min (ref >60)
Glucose, Bld: 104 mg/dL — ABNORMAL HIGH (ref 70–99)
Potassium: 3.5 mmol/L (ref 3.5–5.1)
Sodium: 134 mmol/L — ABNORMAL LOW (ref 135–145)

## 2022-08-09 LAB — MAGNESIUM: Magnesium: 1.8 mg/dL (ref 1.7–2.4)

## 2022-08-09 LAB — PROTIME-INR
INR: 3.4 — ABNORMAL HIGH (ref 0.8–1.2)
Prothrombin Time: 34.2 seconds — ABNORMAL HIGH (ref 11.4–15.2)

## 2022-08-09 MED ORDER — METOCLOPRAMIDE HCL 5 MG PO TABS
5.0000 mg | ORAL_TABLET | Freq: Three times a day (TID) | ORAL | 0 refills | Status: DC
  Filled 2022-08-09: qty 60, 15d supply, fill #0

## 2022-08-09 MED ORDER — PROCHLORPERAZINE MALEATE 5 MG PO TABS
5.0000 mg | ORAL_TABLET | Freq: Four times a day (QID) | ORAL | 0 refills | Status: DC | PRN
  Filled 2022-08-09: qty 30, 8d supply, fill #0

## 2022-08-09 MED ORDER — POTASSIUM CHLORIDE CRYS ER 20 MEQ PO TBCR
20.0000 meq | EXTENDED_RELEASE_TABLET | Freq: Once | ORAL | Status: AC
  Administered 2022-08-09: 20 meq via ORAL
  Filled 2022-08-09: qty 1

## 2022-08-09 MED ORDER — POTASSIUM CHLORIDE 20 MEQ PO PACK
20.0000 meq | PACK | Freq: Once | ORAL | Status: DC
  Filled 2022-08-09: qty 1

## 2022-08-09 NOTE — Progress Notes (Deleted)
Cardiology Office Note:    Date:  08/09/2022   ID:  [name], DOB [birth year], MRN [id number]  PCP:  Donney Dice, [place] Prov[facility] Cardiologist:  Loralie Champagne, MD Electrophysiologist:  Will Meredith Leeds, MD { Click to update primary MD,subspecialty MD or APP then REFRESH:1}    Referring MD: Donney Dice, DO   No chief complaint on file. ***  History of Present Illness:    [name] is a 47 y.o. 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 NICM diagnosed 2020 with LVEF 20-25%, apical thrombus, and noted to be in Afib. GDMT titrated and [place] started. Despite medical therapy, LVEF remained depressed. Echo 09/2019 with LVEF < 20%, moderate LVH, garde 2 DD, severe RV dysfunction, moderately elevated PASP --> slightly improved to LVEF 35-40% on echo 12/2019. Cardiac MRI 08/2021 negative for prior MI, infiltrative disease, and myocarditis. She was transitioned to xarelto due to poor compliance and subtherapeutic INRs. Large LV thrombus in 02/2022, maintaining sinus rhythm. She has a history of medication and follow up noncompliance as well as dietary indiscretion and has thus been discharged from the AHF clinic, although formal letter is not available to view in epic. Due to recurrent LV thrombus, she was switched back to coumadin.  She was recently admitted in December 2023 and again last week Jan 2024 for CHF exacerbations.     Biventricular failure NICM Pulmonary hypertension Obesity Hypertension Noncompliance Dietary indiscretion   Recurrent LV thrombus Longterm coumadin therapy Failed DOAC Back on coumadin     Past Medical History:  Diagnosis Date   Abnormal Pap smear    Acute combined systolic and diastolic heart failure ([place]) 02/19/2019   Wt  Readings from Last 3 Encounters:  09/18/21  84.7 kg  09/02/21  84.4 kg  08/31/21  84.2 kg         Acute heart failure ([place]) 01/29/2022   Acute on chronic combined systolic and diastolic CHF (congestive heart failure) ([place]) 02/19/2019   Acute on chronic congestive heart failure (HCC)    Acute on chronic congestive heart failure (HCC)    Acute on chronic diastolic CHF (congestive heart failure) ([place]) 10/19/2021   Acute on chronic systolic heart failure ([place]) 02/22/2022   Anemia    Asthma    Atrial fibrillation ([place])    history of paroxysmal atrial fibrillation   CHF (congestive heart failure) ([place]) 11/02/2021   CHF exacerbation ([place]) 10/17/2021   Diabetes mellitus    DKA, type 2 ([place]) 02/06/2019   Elevated troponin 09/30/2019   Heart failure with reduced ejection fraction ([place])    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     Migraines    Morbid obesity ([place]) 02/06/2019   Nausea & vomiting 06/26/2022   Nonischemic cardiomyopathy ([place]) 09/22/2021   NSTEMI (non-ST elevated myocardial infarction) ([place]) 02/23/2022   Obstructive sleep apnea 06/30/2015   Polycystic ovarian disease     Past Surgical  History:  Procedure Laterality Date   BIOPSY  11/05/2021   Procedure: BIOPSY;  Surgeon: Daryel November, MD;  Location: [facility];  Service: Gastroenterology;;   CERVIX LESION DESTRUCTION     CESAREAN SECTION     ESOPHAGOGASTRODUODENOSCOPY (EGD) WITH PROPOFOL N/A 11/05/2021   Procedure: ESOPHAGOGASTRODUODENOSCOPY (EGD) WITH PROPOFOL;  Surgeon: Daryel November, MD;  Location: [facility];  Service: Gastroenterology;   Laterality: N/A;   RIGHT/LEFT HEART CATH AND CORONARY ANGIOGRAPHY N/A 03/29/2019   Procedure: RIGHT/LEFT HEART CATH AND CORONARY ANGIOGRAPHY;  Surgeon: Larey Dresser, MD;  Location: [facility];  Service: Cardiovascular;  Laterality: N/A;   RIGHT/LEFT HEART CATH AND CORONARY ANGIOGRAPHY N/A 09/20/2021   Procedure: RIGHT/LEFT HEART CATH AND CORONARY ANGIOGRAPHY;  Surgeon: Larey Dresser, MD;  [facility];  Service: Cardiovascular;  Laterality: N/A;    Current Medications: No outpatient medications have been marked as taking for the 08/11/22 encounter (Appointment) with Ledora Bottcher, [place].     Allergies:   Fish allergy, Shellfish allergy, Metformin and related, Trulicity [dulaglutide], Nsaids, and Advil [ibuprofen]   Social History   Socioeconomic History   Marital status: Single    Spouse name: Not on file   Number of children: Not on file   Years of education: Not on file   Highest education level: Not on file  Occupational History   Not on file  Tobacco Use   Smoking status: Never   Smokeless tobacco: Never   Tobacco comments:    Never smoke 10/22/21  Vaping Use   Vaping Use: Never used  Substance and Sexual Activity   Alcohol use: No    Alcohol/week: 0.0 standard drinks of alcohol   Drug use: No   Sexual activity: Yes  Other Topics Concern   Not on file  Social History Narrative   Not on file   Social Determinants of Health   Financial Resource Strain: High Risk (10/17/2019)   Overall Financial Resource Strain (CARDIA)    Difficulty of Paying Living Expenses: Hard  Food Insecurity: No Food Insecurity (08/07/2022)   Hunger Vital Sign    Worried About Running Out of Food in the Last Year: Never true    Ran Out of Food in the Last Year: Never true  Transportation Needs: Unmet Transportation Needs (08/07/2022)   PRAPARE - Hydrologist (Medical): Yes    Lack of Transportation (Non-Medical): No  Physical Activity:  Insufficiently Active (08/19/2020)   Exercise Vital Sign    Days of Exercise per Week: 7 days    Minutes of Exercise per Session: 20 min  Stress: Stress Concern Present (08/19/2020)   [place]    Feeling of Stress : Very much  Social Connections: Not on file     Family History: The patient's ***family history includes Diabetes in her father and mother; Hypertension in her father and mother.  ROS:   Please see the history of present illness.    *** All other systems reviewed and are negative.  EKGs/Labs/Other Studies Reviewed:    The following studies were reviewed today: ***  EKG:  EKG is *** ordered today.  The ekg ordered today demonstrates ***  Recent Labs: 06/25/2022: TSH 1.887 08/05/2022: ALT 13; B Natriuretic Peptide 1,231.0 08/09/2022: BUN 10; Creatinine, Ser 1.10; Hemoglobin 12.0; Magnesium 1.8; Platelets 222; Potassium 3.5; Sodium 134  Recent Lipid Panel    Component Value Date/Time   CHOL 222 (  H) 09/18/2021 0328   CHOL 266 (H) 10/20/2017 1054   TRIG 97 09/18/2021 0328   HDL 43 09/18/2021 0328   HDL 43 10/20/2017 1054   CHOLHDL 5.2 09/18/2021 0328   VLDL 19 09/18/2021 0328   LDLCALC 160 (H) 09/18/2021 0328   LDLCALC 195 (H) 10/20/2017 1054   LDLDIRECT 154 (H) 01/20/2014 1522     Risk Assessment/Calculations:   {Does this patient have ATRIAL FIBRILLATION?:[phone]}            Physical Exam:    VS:  LMP 05/07/2022 Comment: irreg LMP    Wt Readings from Last 3 Encounters:  08/09/22 174 lb 8 oz (79.2 kg)  08/05/22 199 lb (90.3 kg)  06/24/22 189 lb (85.7 kg)     GEN: *** Well nourished, well developed in no acute distress HEENT: Normal NECK: No JVD; No carotid bruits LYMPHATICS: No lymphadenopathy CARDIAC: ***RRR, no murmurs, rubs, gallops RESPIRATORY:  Clear to auscultation without rales, wheezing or rhonchi  ABDOMEN: Soft, non-tender, non-distended MUSCULOSKELETAL:  No edema; No  deformity  SKIN: Warm and dry NEUROLOGIC:  Alert and oriented x 3 PSYCHIATRIC:  Normal affect   ASSESSMENT:    No diagnosis found. PLAN:    In order of problems listed above:  ***      {Are you ordering a CV Procedure (e.g. stress test, cath, DCCV, TEE, etc)?   Press F2        :[id number]    Medication Adjustments/Labs and Tests Ordered: Current medicines are reviewed at length with the patient today.  Concerns regarding medicines are outlined above.  No orders of the defined types were placed in this encounter.  No orders of the defined types were placed in this encounter.   There are no Patient Instructions on file for this visit.   Signed, Ledora Bottcher, PA  08/09/2022 1:30 PM    [facility]

## 2022-08-09 NOTE — Plan of Care (Signed)

## 2022-08-09 NOTE — TOC Transition Note (Signed)
Transition of Care [facility]) - CM/SW Discharge Note   Patient Details  Name: [name] MRN: [id number] Date of Birth: [birth year]  Transition of Care [facility]) CM/SW Contact:  Zenon Mayo, RN Phone Number: 08/09/2022, 3:18 PM   Clinical Narrative:    Patient for dc today, she states she will be calling an uber that she will pay for at dc.  She has no other needs.   Final next level of care: Home/Self Care Barriers to Discharge: No Barriers Identified   Patient Goals and CMS Choice   Choice offered to / list presented to : NA  Discharge Placement                         Discharge Plan and Services Additional resources added to the After Visit Summary for   In-house Referral: NA Discharge Planning Services: CM Consult Post Acute Care Choice: NA          DME Arranged: N/A DME Agency: NA       HH Arranged: NA          Social Determinants of Health (SDOH) Interventions SDOH Screenings   Food Insecurity: No Food Insecurity (08/07/2022)  Housing: Low Risk  (08/07/2022)  Transportation Needs: Unmet Transportation Needs (08/07/2022)  Utilities: Not At Risk (08/07/2022)  Alcohol Screen: Low Risk  (08/19/2020)  Depression (PHQ2-9): High Risk (08/05/2022)  Financial Resource Strain: High Risk (10/17/2019)  Physical Activity: Insufficiently Active (08/19/2020)  Stress: Stress Concern Present (08/19/2020)  Tobacco Use: Low Risk  (08/05/2022)     Readmission Risk Interventions    08/09/2022    3:16 PM  Readmission Risk Prevention Plan  Transportation Screening Complete  Medication Review ([place]) Complete  PCP or Specialist appointment within 3-5 days of discharge Complete  HRI or [place] Not Applicable

## 2022-08-09 NOTE — Progress Notes (Signed)
Physical Therapy Treatment & Discharge Patient Details Name: [name] MRN: [id number] DOB: [birth year] Today's Date: 08/09/2022   History of Present Illness Pt is a 47 y.o. female admitted 08/05/22 with SOB, anasarca, N/V. Workup for HFrEF exacerbation. PMH includes CAD, DM2, HTN, afib on warfarin, asthma, anxiety, depression.   PT Comments    Pt progressing with mobility. Pt independent with mobility and self-care tasks. Reviewed educ re: activity recommendations, energy conservation strategies, importance of mobility. Pt preparing for d/c home today, has met short-term acute PT goals, reports no further questions or concerns. Will d/c acute PT.    Recommendations for follow up therapy are one component of a multi-disciplinary discharge planning process, led by the attending physician.  Recommendations may be updated based on patient status, additional functional criteria and insurance authorization.  Follow Up Recommendations  No PT follow up     Assistance Recommended at Discharge Intermittent Supervision/Assistance  Patient can return home with the following Help with stairs or ramp for entrance   Equipment Recommendations  None recommended by PT    Recommendations for Other Services       Precautions / Restrictions Precautions Precautions: None Restrictions Weight Bearing Restrictions: No     Mobility  Bed Mobility Overal bed mobility: Independent                  Transfers Overall transfer level: Independent Equipment used: None                    Ambulation/Gait Ambulation/Gait assistance: Independent Gait Distance (Feet): 200 Feet Assistive device: None Gait Pattern/deviations: Step-through pattern, Decreased stride length Gait velocity: Decreased     General Gait Details: slow, guarded gait indep without DME; pt able to increase gait speed slightly with cues, endorses nausea. no overt instability or LOB noted   Stairs Stairs:  (educ  on energy conserv strategies for ascending/descending stairs)           Wheelchair Mobility    Modified Rankin (Stroke Patients Only)       Balance Overall balance assessment: Independent Sitting-balance support: No upper extremity supported Sitting balance-Leahy Scale: Good     Standing balance support: No upper extremity supported, During functional activity Standing balance-Leahy Scale: Good                              Cognition Arousal/Alertness: Awake/alert Behavior During Therapy: WFL for tasks assessed/performed Overall Cognitive Status: Within Functional Limits for tasks assessed                                          Exercises      General Comments        Pertinent Vitals/Pain Pain Assessment Pain Assessment: Faces Faces Pain Scale: Hurts a little bit Pain Location: abdomen (nausea) Pain Descriptors / Indicators: Discomfort Pain Intervention(s): Monitored during session    Home Living                          Prior Function            PT Goals (current goals can now be found in the care plan section) Progress towards PT goals: Goals met/education completed, patient discharged from PT    Frequency    Min 3X/week  PT Plan Current plan remains appropriate    Co-evaluation              AM-PAC PT "6 Clicks" Mobility   Outcome Measure  Help needed turning from your back to your side while in a flat bed without using bedrails?: None Help needed moving from lying on your back to sitting on the side of a flat bed without using bedrails?: None Help needed moving to and from a bed to a chair (including a wheelchair)?: None Help needed standing up from a chair using your arms (e.g., wheelchair or bedside chair)?: None Help needed to walk in hospital room?: None Help needed climbing 3-5 steps with a railing? : A Little 6 Click Score: 23    End of Session   Activity Tolerance: Patient  tolerated treatment well Patient left: in bed;with call bell/phone within reach Nurse Communication: Mobility status PT Visit Diagnosis: Unsteadiness on feet (R26.81);Muscle weakness (generalized) (M62.81);Difficulty in walking, not elsewhere classified (R26.2)     Time: 4128-7867 PT Time Calculation (min) (ACUTE ONLY): 14 min  Charges:  $Therapeutic Activity: 8-22 mins                      Mabeline Caras, PT, DPT Acute Rehabilitation Services  Personal: [place] [facility] Office: [place] 08/09/2022, 3:24 PM

## 2022-08-09 NOTE — Progress Notes (Signed)
Explained discharge instructions to patient. Reviewed follow up appointment and next medication administration times. Also reviewed education. Patient verbalized having an understanding for instructions given. All belongings are in the patient's possession to include TOC meds. IV and telemetry were removed. CCMD was notified. No other needs verbalized. Transported downstairs for discharge. 

## 2022-08-09 NOTE — Progress Notes (Signed)
CCMD notified RN of a 8 beat run of [place]. Patient was currently vomiting due this run. RN made CCMD aware. MD paged and notified.

## 2022-08-09 NOTE — Discharge Instructions (Addendum)
Dear [name],   Thank you so much for allowing [place] to be part of your care!  You were admitted to [facility] for your heart failure exacerbation and I am glad you are doing better!  - Take Reglan 15 minutes before meals. Continue for another 2-3 weeks and then stop.  - Take Compazine every 8 hours as needed for nausea and vomiting.  - It is VERY IMPORTANT that you continue taking Lasix '80mg'$  twice a day. If you are vomiting and unable to keep down your Lasix for over 24hours, call your PCP for help.  We have scheduled your follow up appointment. Please call the clinic if you need to change the time.   POST-HOSPITAL & CARE INSTRUCTIONS Please let PCP/Specialists know of any changes that were made.  Please see medications section of this packet for any medication changes.   DOCTOR'S APPOINTMENT & FOLLOW UP CARE INSTRUCTIONS  Future Appointments  Date Time Provider [place]  08/11/2022 11:20 AM Duke, Tami Lin, PA [facility] None  08/31/2022 10:20 AM Branch, Royetta Crochet, MD [facility] None    Take care and be well!  [facility]  [place], [place] [address] [phone]

## 2022-08-09 NOTE — Progress Notes (Signed)
ANTICOAGULATION CONSULT NOTE -Follow Up  Pharmacy Consult for warfarin  Indication:  LV Thrombus  and afib   Allergies  Allergen Reactions   Fish Allergy Swelling   Shellfish Allergy Anaphylaxis, Swelling and Other (See Comments)    Airway involvement including EMS - Has Epi-Pen   Metformin And Related Diarrhea   Trulicity [Dulaglutide] Nausea And Vomiting and Other (See Comments)    Multiple episodes of vomiting over multiple days   Nsaids Other (See Comments)    Per PCP interferes with daily meds (heart failure)   Advil [Ibuprofen] Other (See Comments)    Per PCP interferes with daily meds (heart failure)    Vital Signs: Temp: 97.3 F (36.3 C) (01/16 0500) Temp Source: Oral (01/16 0500) BP: 128/98 (01/16 0500) Pulse Rate: 83 (01/16 0500)  Labs: Recent Labs    08/07/22 0050 08/08/22 0031 08/09/22 0044  HGB 12.0 12.6 12.0  HCT 38.3 41.1 38.1  PLT 216 253 222  LABPROT 18.4* 25.0* 34.2*  INR 1.6* 2.3* 3.4*  CREATININE 0.90 1.03* 1.10*     Estimated Creatinine Clearance: 59.3 mL/min (A) (by C-G formula based on SCr of 1.1 mg/dL (H)).   Medical History: Past Medical History:  Diagnosis Date   Abnormal Pap smear    Acute combined systolic and diastolic heart failure ([place]) 02/19/2019   Wt Readings from Last 3 Encounters:  09/18/21  84.7 kg  09/02/21  84.4 kg  08/31/21  84.2 kg         Acute heart failure ([place]) 01/29/2022   Acute on chronic combined systolic and diastolic CHF (congestive heart failure) ([place]) 02/19/2019   Acute on chronic congestive heart failure (HCC)    Acute on chronic congestive heart failure (HCC)    Acute on chronic diastolic CHF (congestive heart failure) ([place]) 10/19/2021   Acute on chronic systolic heart failure ([place]) 02/22/2022   Anemia    Asthma    Atrial fibrillation ([place])    history of paroxysmal atrial fibrillation   CHF (congestive heart failure) ([place]) 11/02/2021   CHF exacerbation ([place]) 10/17/2021   Diabetes mellitus    DKA, type  2 ([place]) 02/06/2019   Elevated troponin 09/30/2019   Heart failure with reduced ejection fraction ([place])    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     Migraines    Morbid obesity ([place]) 02/06/2019   Nausea & vomiting 06/26/2022   Nonischemic cardiomyopathy ([place]) 09/22/2021   NSTEMI (non-ST elevated myocardial infarction) ([place]) 02/23/2022   Obstructive sleep apnea 06/30/2015   Polycystic ovarian disease      Assessment: Patient presenting to ED with concern for fluid overload. On warfarin PTA for history of AV thrombus and afib. Patient reports last dose 1/10 2/2 significant N/V PTA. Pharmacy consulted to dose warfarin.   Patient reported home regimen:11.25 mg daily except for Sunday take 7.5 mg   INR today 3.4 supratherapeutic, jumped from INR 2.3 yesterday. Hgb 12.0  and plt WNL from this AM stable. No s/sx bleeding noted in chart. Patient on lovenox bridge while subtherapeutic on warfarin. Patient continues to have episodes of N/V and poor appetite. INR now supratherapeutic, will  discontinue lovenox bridge   Goal of Therapy:  INR 2-3 Monitor platelets by anticoagulation protocol: Yes   Plan:  Hold warfarin for one day, and then resume PTA warfarin schedule Obtain daily INR and CBC  Discontinue lovenox bridge   Sandford Craze, PharmD. [facility] Acute Care PGY-1 08/09/2022 6:50 AM

## 2022-08-09 NOTE — Progress Notes (Shared)
Daily Progress Note Intern Pager: [phone]  Patient name: [name] Medical record number: [id number] Date of birth: 08/25/1975 Age: 47 y.o. Gender: female  Primary Care Provider: Donney Dice, DO Consultants: None Code Status: Full  Pt Overview and Major Events to Date:  1/12 Admitted  Assessment and Plan: [name] is a 47 y.o. female p/w SOB and anasarca as well as N/V in the setting of acute on chronic heart failure exacerbation.  Pertinent PMH/PSH includes PAF on warfarin, hx of LV thrombus, CAD, T2DM on insulin, HTN, Asthma    CHF exacerbation (HCC) Hx HFrEF, EF <20% (02/2022).  UOP 3.8L over last 24hrs. Wt now 174 lbs (prior lowest weights are ~175, so 174lb may be her new dry weight). Normal WOB on RA. Cr slightly uptrended. Pt overall euvolemic and ready to transition to PO lasix. - Start Lasix 80 PO BID. Stop IV lasix. - Repleted K - Cont home spiro, jardiance, metop - Strict I/O. Daily Weights. -Continuous pulse ox at least over night -am BMP -PT/OT   Nausea and vomiting Unclear etiology; differential includes T2DM gatroparesis and abm edema 2/2 CHF. Previously seen GI 10/2021 for N/V workup, EGD at the time was unremarkable. From chart review, it looks like the next step was to pursue gastric emptying testing to workup gastroparesis. Pt has also trialed Metoclopramide most recently in 06/2022 admission, but pt is agreeable to trialing it again.  - Start Metoclopramide '5mg'$  IV QID (administer 67mn prior to meals and at bedtime) - PO Compazine every 6 hours as needed - Outpt GI f/u. Needs gastric emptying   Paroxysmal atrial fibrillation (HCC) Stable -home metoprolol -Warfarin per pharmacy  Poorly controlled type 2 diabetes mellitus ([place] Home medication includes Lantus 15 units daily, NovoLog 12 units before dinner, Jardiance 10 mg daily.  A1c 06/02/2022 of 15.5. Yesterday, had 15 units semglee  and 2 aspart, ABG 140 this am. -Semglee 15 units  daily -mSSI  -CBGs 4 times daily  Hypertension associated with diabetes ([place] Stably elevated -Cont home losartan and metop       FEN/GI: *** PPx: *** Dispo:Home today. Barriers include ***.   Subjective:  Reports no adverse effects from starting Reglan, agreeable to continuing. Still reports trouble breathing.   Objective: Temp:  [97.3 F (36.3 C)-97.7 F (36.5 C)] 97.3 F (36.3 C) (01/16 0500) Pulse Rate:  [83-91] 87 (01/16 0819) Resp:  [16-18] 16 (01/16 0819) BP: (125-134)/(96-107) 134/106 (01/16 0819) SpO2:  [96 %-100 %] 96 % (01/16 0819) Weight:  [79.2 kg] 79.2 kg (01/16 0050) Physical Exam: General: Tired, but alert and laying in bed. NAD. Cardiovascular: RRR, no murmurs Respiratory: CTAB. Normal WOB on RA Abdomen: Uncomfortable to palpation in L abm. Soft, nondistended. Normal BS. Extremities: Trace edema BL. (Difficult to assess due to compression socks)  Laboratory: Most recent CBC Lab Results  Component Value Date   WBC 6.4 08/09/2022   HGB 12.0 08/09/2022   HCT 38.1 08/09/2022   MCV 69.0 (L) 08/09/2022   PLT 222 08/09/2022   Most recent BMP    Latest Ref Rng & Units 08/09/2022   12:44 AM  BMP  Glucose 70 - 99 mg/dL 104   BUN 6 - 20 mg/dL 10   Creatinine 0.44 - 1.00 mg/dL 1.10   Sodium 135 - 145 mmol/L 134   Potassium 3.5 - 5.1 mmol/L 3.5   Chloride 98 - 111 mmol/L 94   CO2 22 - 32 mmol/L 31   Calcium 8.9 -  10.3 mg/dL 8.6      Tammy Dice, MD 08/09/2022, 9:20 AM  PGY-1, [place] Intern pager: [phone], text pages welcome Secure chat group [place]

## 2022-08-09 NOTE — Discharge Summary (Signed)
[facility] Discharge Summary  Patient name: [name] Medical record number: [id number] Date of birth: June 09, 1976 Age: 47 y.o. Gender: female Date of Admission: 08/05/2022  Date of Discharge: 08/09/2022 Admitting Physician: Lenoria Chime, MD  Primary Care Provider: Donney Dice, DO Consultants: None  Indication for Hospitalization: CHF Exacerbation  Brief Hospital Course:  [name] is a 47 y.o.female with a history of chronic N/V, afib, T2DM, HFrEF (EF<20%) who was [facility] to [facility] [place] [facility] for CHF Exacerbation. Her hospital course is detailed below:  CHF exacerbation  HFrEF (EF <20%) Presented in fluid overload and increased WOB in setting of inability to take home Lasix 2/2 N/V (discussed below). Pulm edema noted on CXR. BNP 1231. Was stable on room air and did not require supplemental O2 during entire admission. Received IV Lasix 80 BID with improvement in her volume status. Pt remained on home spironolactone, jardiance, and metoprolol. Pt was discharged on PO Lasix 80 BID w/ good UOP. Admission wt 196lbs; Wt at discharge is 174.5lb (this may NOT represent dry weight as pt still had some trace edema on discharge).   To prevent another CHF exac due to N/V, pt was advised to take prn Compazine, then reach out to PCP if unable to take home Lasix for >24hrs.   Acute Exacerbation of Chronic Nausea/vomiting  abdominal pain P/w nausea/vomiting as well as abdominal and leg pain. Likely multifactoral including poorly controled T2DM and volume overload. Did receive IV morphine intermittently for this. Also treated w/ compazine and Reglan for N/V. At discharge, pt still having N/V but improved since admission and pt was discharged w/ short course of Reglan and prn compazine.   Per chart review, pt was following outpt with GI to workup for gastroparesis. Pt needs to follow-up outpt w/ GI to complete a gastric emptying study.    Paroxysmal AF Remained stable. Pt cont home home metop and warfarin.   Other chronic conditions were medically managed with home medications and formulary alternatives as necessary:  T2DM: Continued insulin HTN: continued losartan  PCP Follow-up Recommendations: May need adjustment of insulin. Pt glucose was managed w/ Semglee 15u daily and SSI w/ meals. However, pt also had decreased PO while admitted. A1c elevated to >15.5. Needs outpt follow-up w/ GI for gastric emptying study and further workup for gastroparesis.  Consider increasing Sertraline for better control of mood. Ensure pt has a back plan in place to get Lasix in case N/V prevents her from taking PO.   Discharge Diagnoses/Problem List:    Disposition: Home  Discharge Condition: Improved, stable  Discharge Exam:  General: Tired, but alert and laying in bed. NAD. Cardiovascular: RRR, no murmurs Respiratory: CTAB. Normal WOB on RA Abdomen: Uncomfortable to palpation in L abm. Soft, nondistended. Normal BS. Extremities: Trace edema BL. (Difficult to assess due to compression socks)  Significant Labs and Imaging:  Recent Labs  Lab 08/08/22 0031 08/09/22 0044  WBC 6.7 6.4  HGB 12.6 12.0  HCT 41.1 38.1  PLT 253 222   Recent Labs  Lab 08/08/22 0031 08/09/22 0044  NA 135 134*  K 3.2* 3.5  CL 92* 94*  CO2 31 31  GLUCOSE 96 104*  BUN 9 10  CREATININE 1.03* 1.10*  CALCIUM 8.9 8.6*  MG  --  1.8    Results/Tests Pending at Time of Discharge: none  Discharge Medications:  Allergies as of 08/09/2022       Reactions   Fish Allergy  Swelling   Shellfish Allergy Anaphylaxis, Swelling, Other (See Comments)   Airway involvement including EMS - Has Epi-Pen   Metformin And Related Diarrhea   Trulicity [dulaglutide] Nausea And Vomiting, Other (See Comments)   Multiple episodes of vomiting over multiple days   Nsaids Other (See Comments)   Per PCP interferes with daily meds (heart failure)   Advil [ibuprofen]  Other (See Comments)   Per PCP interferes with daily meds (heart failure)        Medication List     STOP taking these medications    ondansetron 4 MG disintegrating tablet Commonly known as: ZOFRAN-ODT   oxyCODONE 5 MG immediate release tablet Commonly known as: Oxy IR/ROXICODONE       TAKE these medications    Accu-Chek Softclix Lancets lancets Use as instructed   Accu-Chek Softclix Lancets lancets Use up 4 (four) times daily as directed   acetaminophen 500 MG tablet Commonly known as: TYLENOL Take 500-1,000 mg by mouth every 6 (six) hours as needed for mild pain or headache.   atorvastatin 40 MG tablet Commonly known as: LIPITOR TAKE 1 TABLET BY MOUTH DAILY AT 6 PM. What changed:  how much to take when to take this   blood glucose meter kit and supplies Use up to 4 (four) times daily as directed   ferrous sulfate 325 (65 FE) MG tablet Take 1 tablet (325 mg total) by mouth daily with breakfast.   furosemide 80 MG tablet Commonly known as: LASIX Take 1 tablet (80 mg total) by mouth 2 (two) times daily.   hydrOXYzine 10 MG tablet Commonly known as: ATARAX Take 1 tablet (10 mg total) by mouth 3 (three) times daily as needed. What changed: reasons to take this   Jardiance 10 MG Tabs tablet Generic drug: empagliflozin Take 1 tablet (10 mg total) by mouth daily.   Lantus SoloStar 100 UNIT/ML Solostar Pen Generic drug: insulin glargine Inject 15 Units into the skin daily.   losartan 50 MG tablet Commonly known as: COZAAR Take 1 tablet (50 mg total) by mouth daily.   metoCLOPramide 5 MG tablet Commonly known as: REGLAN Take 1 tablet (5 mg total) by mouth 4 (four) times daily -  before meals and at bedtime.   metoprolol succinate 25 MG 24 hr tablet Commonly known as: TOPROL-XL Take 1 tablet (25 mg total) by mouth daily.   NovoLOG FlexPen 100 UNIT/ML FlexPen Generic drug: insulin aspart Inject 12 Units into the skin daily before supper.   OneTouch  Verio test strip Generic drug: glucose blood Use up to 4 (four) times daily as directed   polyethylene glycol 17 g packet Commonly known as: MIRALAX / GLYCOLAX Take 17 g by mouth daily as needed for mild constipation.   prochlorperazine 5 MG tablet Commonly known as: COMPAZINE Take 1 tablet (5 mg total) by mouth every 6 (six) hours as needed for refractory nausea / vomiting.   senna-docusate 8.6-50 MG tablet Commonly known as: Senokot-S Take 1 tablet by mouth at bedtime as needed for moderate constipation. What changed: Another medication with the same name was removed. Continue taking this medication, and follow the directions you see here.   sertraline 50 MG tablet Commonly known as: ZOLOFT Take 50 mg by mouth daily.   spironolactone 25 MG tablet Commonly known as: ALDACTONE Take 0.5 tablets (12.5 mg total) by mouth daily.   TechLite Pen Needles 31G X 5 MM Misc Generic drug: Insulin Pen Needle USE AS DIRECTED 2 TIMES DAILY  traZODone 50 MG tablet Commonly known as: DESYREL Take 1 tablet (50 mg total) by mouth at bedtime.   warfarin 7.5 MG tablet Commonly known as: COUMADIN Take as directed. If you are unsure how to take this medication, talk to your nurse or doctor. Original instructions: Take 1-2 tablets (7.5-15 mg total) by mouth daily as directed by [facility] What changed: additional instructions        Discharge Instructions: Please refer to Patient Instructions section of EMR for full details.  Patient was counseled important signs and symptoms that should prompt return to medical care, changes in medications, dietary instructions, activity restrictions, and follow up appointments.   Follow-Up Appointments: 08/11/22 11:20am Cardiology   08/12/22 1:30pm Family Medicine Clinic w/ Dr. Leonie Green, MD 08/09/2022, 1:28 PM PGY-1, [place]

## 2022-08-09 NOTE — TOC Initial Note (Signed)
Transition of Care [facility]) - Initial/Assessment Note    Patient Details  Name: [name] MRN: [id number] Date of Birth: 28-Apr-1976  Transition of Care [facility]) CM/SW Contact:    Zenon Mayo, RN Phone Number: 08/09/2022, 3:18 PM  Clinical Narrative:                 Patient for dc today, she states she will be calling an uber that she will pay for at dc.  She has no other needs.  Expected Discharge Plan: Home/Self Care Barriers to Discharge: No Barriers Identified   Patient Goals and CMS Choice Patient states their goals for this hospitalization and ongoing recovery are:: return home   Choice offered to / list presented to : NA      Expected Discharge Plan and Services In-house Referral: NA Discharge Planning Services: CM Consult Post Acute Care Choice: NA Living arrangements for the past 2 months: Apartment Expected Discharge Date: 08/09/22               DME Arranged: N/A DME Agency: NA       HH Arranged: NA          Prior Living Arrangements/Services Living arrangements for the past 2 months: Apartment Lives with:: Self Patient language and need for interpreter reviewed:: Yes Do you feel safe going back to the place where you live?: Yes      Need for Family Participation in Patient Care: No (Comment) Care giver support system in place?: No (comment)   Criminal Activity/Legal Involvement Pertinent to Current Situation/Hospitalization: No - Comment as needed  Activities of Daily Living Home Assistive Devices/Equipment: None ADL Screening (condition at time of admission) Patient's cognitive ability adequate to safely complete daily activities?: Yes Is the patient deaf or have difficulty hearing?: No Does the patient have difficulty seeing, even when wearing glasses/contacts?: No Does the patient have difficulty concentrating, remembering, or making decisions?: No Patient able to express need for assistance with ADLs?: Yes Does the patient have  difficulty dressing or bathing?: No Independently performs ADLs?: Yes (appropriate for developmental age) Does the patient have difficulty walking or climbing stairs?: No Weakness of Legs: None Weakness of Arms/Hands: None  Permission Sought/Granted                  Emotional Assessment Appearance:: Appears stated age Attitude/Demeanor/Rapport: Engaged Affect (typically observed): Appropriate Orientation: : Oriented to Self, Oriented to Place, Oriented to  Time, Oriented to Situation Alcohol / Substance Use: Not Applicable Psych Involvement: No (comment)  Admission diagnosis:  CHF exacerbation ([place]) [I50.9] Acute on chronic congestive heart failure, unspecified heart failure type ([place]) [I50.9] Nausea and vomiting, unspecified vomiting type [R11.2] Patient Active Problem List   Diagnosis Date Noted   CHF exacerbation ([place]) 08/05/2022   Nausea and vomiting 06/25/2022   Mild protein malnutrition ([place]) 06/25/2022   Class 2 obesity 06/25/2022   Hyperbilirubinemia 06/25/2022   Chronic pain 05/09/2022   Dizziness 11/02/2021   Depression, recurrent ([place])    Nonischemic cardiomyopathy ([place]) 09/22/2021   Nonobstructive atherosclerosis of coronary artery 09/22/2021   History of left ventricular mural thrombus 09/22/2021   Chronic chest wall pain 03/27/2019   Hyperglycemia    Biventricular failure ([place]) 02/06/2019   Morbid obesity ([place]) 02/06/2019   Chronic combined systolic and diastolic heart failure ([place]) 12/31/2018   Paroxysmal atrial fibrillation ([place]) 12/31/2018   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/31/2018   Chronic nausea 12/27/2018   Hyperlipidemia associated with type 2  diabetes mellitus ([place]) 10/30/2017   Obstructive sleep apnea 06/30/2015   Shortness of breath 06/29/2015   Anemia of chronic disease 07/22/2013   Allergic rhinitis 06/27/2012   Hypertension associated with diabetes ([place]) 12/04/2011   Poorly controlled type 2 diabetes mellitus ([place])  05/19/2011   PCOS (polycystic ovarian syndrome) 05/19/2011   PCP:  Donney Dice, DO Pharmacy:   [facility] [phone] [place], [place] - [place] [address] Phone: [phone] Fax: [phone]  [facility] [place], [place] - [place] [address] Phone: [phone] Fax: [place] [address] Phone: [phone] Fax: [place] [address] [place] [address] Phone: [phone] Fax: [phone]     Social Determinants of Health (SDOH) Social History: [place]: No Food Insecurity (08/07/2022)  Housing: Low Risk  (08/07/2022)  Transportation Needs: Unmet Transportation Needs (08/07/2022)  Utilities: Not At Risk (08/07/2022)  Alcohol Screen: Low Risk  (08/19/2020)  Depression (PHQ2-9): High Risk (08/05/2022)  Financial Resource Strain: High Risk (10/17/2019)  Physical Activity: Insufficiently Active (08/19/2020)  Stress: Stress Concern Present (08/19/2020)  Tobacco Use: Low Risk  (08/05/2022)   SDOH Interventions:     Readmission Risk Interventions    08/09/2022    3:16 PM  Readmission Risk Prevention Plan  Transportation Screening Complete  Medication Review ([place]) Complete  PCP or Specialist appointment within 3-5 days of discharge Complete  HRI or [place] Complete  [place] Not Applicable

## 2022-08-09 NOTE — Progress Notes (Signed)
Patient relayed to RN that she just vomited. RN offered wash rag, ginger ale and PO reglan. Patient refused PO reglan until closer to lunch tray comes. RN provided emesis bag.

## 2022-08-10 ENCOUNTER — Telehealth: Payer: Self-pay

## 2022-08-10 NOTE — Telephone Encounter (Signed)
Transition Care Management Unsuccessful Follow-up Telephone Call  Date of discharge and from where:  [facility] 08/09/2022  Attempts:  1st Attempt  Reason for unsuccessful TCM follow-up call:  Left voice message Juanda Crumble, [place] Direct Dial [phone]

## 2022-08-11 ENCOUNTER — Ambulatory Visit: Payer: Medicare Other | Admitting: Physician Assistant

## 2022-08-11 NOTE — Telephone Encounter (Signed)
Transition Care Management Follow-up Telephone Call Date of discharge and from where: [facility] 08/09/2022 How have you been since you were released from the hospital? weak Any questions or concerns? No  Items Reviewed: Did the pt receive and understand the discharge instructions provided? Yes  Medications obtained and verified? Yes  Other? No  Any new allergies since your discharge? No  Dietary orders reviewed? Yes Do you have support at home? Yes   Home Care and Equipment/Supplies: Were home health services ordered? no If so, what is the name of the agency? N/a  Has the agency set up a time to come to the patient's home? no Were any new equipment or medical supplies ordered?  No What is the name of the medical supply agency? N/a Were you able to get the supplies/equipment? not applicable Do you have any questions related to the use of the equipment or supplies? No  Functional Questionnaire: (I = Independent and D = Dependent) ADLs: I  Bathing/Dressing- I  Meal Prep- I  Eating- I  Maintaining continence- I  Transferring/Ambulation- I  Managing Meds- I  Follow up appointments reviewed:  PCP Hospital f/u appt confirmed? Yes  Scheduled to see DR Larae Grooms on 08/12/2022 @ 1:30. [place] [facility] f/u appt confirmed? No  Patient will schedule Are transportation arrangements needed? No  If their condition worsens, is the pt aware to call PCP or go to the Emergency Dept.? Yes Was the patient provided with contact information for the PCP's office or ED? Yes Was to pt encouraged to call back with questions or concerns? Yes .lh

## 2022-08-12 ENCOUNTER — Inpatient Hospital Stay: Payer: Self-pay | Admitting: Family Medicine

## 2022-08-12 NOTE — Patient Instructions (Incomplete)
It was nice seeing you today!  Blood work today.  See me in 3 months or whenever is a good for you.  Stay well, Asanti Craigo, MD [facility] [phone]  --  Make sure to check out at the front desk before you leave today.  Please arrive at least 15 minutes prior to your scheduled appointments.  If you had blood work today, I will send you a MyChart message or a letter if results are normal. Otherwise, I will give you a call.  If you had a referral placed, they will call you to set up an appointment. Please give us a call if you don't hear back in the next 2 weeks.  If you need additional refills before your next appointment, please call your pharmacy first.  

## 2022-08-12 NOTE — Progress Notes (Deleted)
    SUBJECTIVE:   CHIEF COMPLAINT / HPI:  No chief complaint on file.   Patient was admitted from 1/12 to 1/16 for CHF exacerbation due to nonadherence to medications due to nausea and vomiting.  PCP Follow-up Recommendations: May need adjustment of insulin. Pt glucose was managed w/ Semglee 15u daily and SSI w/ meals. However, pt also had decreased PO while admitted. A1c elevated to >15.5. Needs outpt follow-up w/ GI for gastric emptying study and further workup for gastroparesis.  Consider increasing Sertraline for better control of mood. Ensure pt has a back plan in place to get Lasix in case N/V prevents her from taking PO.   PERTINENT  PMH / PSH: CHF, NICM, paroxysmal atrial fibrillation, OSA, T2DM, depression, obesity  Patient Care Team: Donney Dice, DO as PCP - General (Family Medicine) Larey Dresser, MD as PCP - Cardiology (Cardiology) Constance Haw, MD as PCP - Electrophysiology (Cardiology) [name], LCSW as Social Worker (Licensed Clinical Social Worker)   OBJECTIVE:   LMP 05/07/2022 Comment: irreg LMP  Physical Exam      08/05/2022    9:39 AM  Depression screen PHQ 2/9  Decreased Interest 3  Down, Depressed, Hopeless 3  PHQ - 2 Score 6  Altered sleeping 3  Tired, decreased energy 3  Change in appetite 3  Suicidal thoughts 0  PHQ-9 Score 15     {Show previous vital signs (optional):23777}  {Labs  Heme  Chem  Endocrine  Serology  Results Review (optional):23779}  ASSESSMENT/PLAN:   No problem-specific Assessment & Plan notes found for this encounter.    No follow-ups on file.   Zola Button, MD [place]

## 2022-08-16 NOTE — Progress Notes (Unsigned)
Cardiology Office Note:    Date:  08/17/2022   ID:  [name], DOB 1976-05-08, MRN [id number]  PCP:  Donney Dice, [place] Cardiologist: Loralie Champagne, MD   Reason for visit: CHF Hospital follow-up  History of Present Illness:    [name] is a 47 y.o. 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%) felt to be nonischemic cardiomyopathy, paroxysmal atrial fibrillation on Xarelto, type 2 DM, anemia, HTN. Shewas initially diagnosed with nonischemic cardiomyopathy in 12/2018 with EF 20-25%.  Despite medical therapy her EF did not improve.  [facility] 03/2019 showed no obstructive CAD, R>L heart failure with low PAPI suggesting significant RV dysfunction, primarily pulmonary venous HTN, low cardiac output, CI 2.07.  She was started on Coumadin for LV thrombus.  Repeat L/RHC 08/2021 with mild nonobstructive CAD, preserved CO.  Dr. Aundra Dubin believed her cardiomyopathy might be caused by poorly controlled diabetes or hypertension.  Cardiac MRI 08/2021 showed no evidence of prior MI, infiltrative disease or myocarditis.  LV thrombus was not seen on MRI.  Patient was later transitioned to Xarelto due to poor compliance with Coumadin and subtherapeutic INRs.    Notes mention "She was reportedly rude to staff and was dismissed from HF clinic. She's had recurrent admissions thought 2/2 difficulty with medication compliance."  She was last seen by Dr. Phineas Inches in August 2023.  With blood-tinged nausea vomiting and persistent LV thrombus, she was switched from Xarelto to Coumadin.  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  She states she was recently admitted because she had symptomatic gastroparesis 3 weeks ago where she was throwing up with subsequent lightheadedness.  She cannot keep any fluids or food down.  She was admitted to [facility] from January 12 to 16 for acute on chronic systolic  heart failure and gastroparesis.  She was diuresed.  She was prescribed Reglan which patient has not taken since discharge.    She states she has been taking Lasix 80 mg just once daily because twice daily would interrupt her job.  She thinks her dry weight is in the 160s.  She states her first symptoms of heart failure are feet swelling with associated tingling and then stomach tightness.  She has constant mild chest pressure/tightness.  Positive orthopnea using 8 pillows.  She has been unable to sleep flat for many years.  She has positive PND and palpitations.  She mentions issues with chronic pain and difficulty to receive adequate pain management.  She is occasionally teary-eyed during our conversation states sometimes she just wants to give up on all these medications.  She denies suicidal ideations.      Past Medical History:  Diagnosis Date   Abnormal Pap smear    Acute combined systolic and diastolic heart failure ([place]) 02/19/2019   Wt Readings from Last 3 Encounters:  09/18/21  84.7 kg  09/02/21  84.4 kg  08/31/21  84.2 kg         Acute heart failure ([place]) 01/29/2022   Acute on chronic combined systolic and diastolic CHF (congestive heart failure) ([place]) 02/19/2019   Acute on chronic congestive heart failure (HCC)    Acute on chronic congestive heart failure (HCC)    Acute on chronic diastolic CHF (congestive heart failure) ([place]) 10/19/2021   Acute on chronic systolic heart failure ([place]) 02/22/2022   Anemia    Asthma  Atrial fibrillation ([place])    history of paroxysmal atrial fibrillation   CHF (congestive heart failure) ([place]) 11/02/2021   CHF exacerbation ([place]) 10/17/2021   Diabetes mellitus    DKA, type 2 ([place]) 02/06/2019   Elevated troponin 09/30/2019   Heart failure with reduced ejection fraction ([place])    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  Consider chronic etiology given continued fertility issues    Hypertension    Hypertensive emergency 09/30/2019   Hypertensive urgency 06/02/2022   Major depressive disorder 02/08/2012   Reports that 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     Migraines    Morbid obesity ([place]) 02/06/2019   Nausea & vomiting 06/26/2022   Nonischemic cardiomyopathy ([place]) 09/22/2021   NSTEMI (non-ST elevated myocardial infarction) ([place]) 02/23/2022   Obstructive sleep apnea 06/30/2015   Polycystic ovarian disease     Past Surgical History:  Procedure Laterality Date   BIOPSY  11/05/2021   Procedure: BIOPSY;  Surgeon: Daryel November, MD;  Location: [facility] ENDOSCOPY;  Service: Gastroenterology;;   CERVIX LESION DESTRUCTION     CESAREAN SECTION     ESOPHAGOGASTRODUODENOSCOPY (EGD) WITH PROPOFOL N/A 11/05/2021   Procedure: ESOPHAGOGASTRODUODENOSCOPY (EGD) WITH PROPOFOL;  Surgeon: Daryel November, MD;  Location: [facility] ENDOSCOPY;  Service: Gastroenterology;  Laterality: N/A;   RIGHT/LEFT HEART CATH AND CORONARY ANGIOGRAPHY N/A 03/29/2019   Procedure: RIGHT/LEFT HEART CATH AND CORONARY ANGIOGRAPHY;  Surgeon: Larey Dresser, MD;  Location: [place] CV LAB;  Service: Cardiovascular;  Laterality: N/A;   RIGHT/LEFT HEART CATH AND CORONARY ANGIOGRAPHY N/A 09/20/2021   Procedure: RIGHT/LEFT HEART CATH AND CORONARY ANGIOGRAPHY;  Surgeon: Larey Dresser, MD;  Location: [place] CV LAB;  Service: Cardiovascular;  Laterality: N/A;    Current Medications: Current Meds  Medication Sig   Accu-Chek Softclix Lancets lancets Use as instructed   Accu-Chek Softclix Lancets lancets Use up 4 (four) times daily as directed   acetaminophen (TYLENOL) 500 MG tablet Take 500-1,000 mg by mouth every 6 (six) hours as needed for mild pain or headache.   atorvastatin (LIPITOR) 40 MG tablet TAKE 1 TABLET BY MOUTH DAILY AT 6 PM. (Patient taking differently: Take 40 mg by mouth every Monday, Wednesday, and Friday.)   blood glucose meter  kit and supplies Use up to 4 (four) times daily as directed   empagliflozin (JARDIANCE) 10 MG TABS tablet Take 1 tablet (10 mg total) by mouth daily.   ferrous sulfate 325 (65 FE) MG tablet Take 1 tablet (325 mg total) by mouth daily with breakfast.   glucose blood (ONETOUCH VERIO) test strip Use up to 4 (four) times daily as directed   hydrOXYzine (ATARAX) 10 MG tablet Take 1 tablet (10 mg total) by mouth 3 (three) times daily as needed. (Patient taking differently: Take 10 mg by mouth 3 (three) times daily as needed for anxiety.)   insulin aspart (NOVOLOG FLEXPEN) 100 UNIT/ML FlexPen Inject 12 Units into the skin daily before supper.   insulin glargine (LANTUS) 100 UNIT/ML Solostar Pen Inject 15 Units into the skin daily.   Insulin Pen Needle (UNIFINE PENTIPS) 31G X 5 MM MISC USE AS DIRECTED 2 TIMES DAILY   losartan (COZAAR) 50 MG tablet Take 1 tablet (50 mg total) by mouth daily.   metoCLOPramide (REGLAN) 5 MG tablet Take 1 tablet (5 mg total) by mouth 4 (four) times daily -  before meals and at bedtime.   metoprolol succinate (TOPROL-XL) 25 MG 24 hr tablet Take 1 tablet (25 mg total) by mouth daily.   polyethylene glycol (MIRALAX / GLYCOLAX) 17 g packet Take 17 g by mouth daily as needed for mild constipation.   prochlorperazine (COMPAZINE) 5 MG tablet Take 1 tablet (5 mg total) by mouth every 6 (six) hours as needed for refractory nausea / vomiting.   senna-docusate (SENOKOT-S) 8.6-50 MG tablet Take 1 tablet by mouth at bedtime as needed for moderate constipation.   sertraline (ZOLOFT) 50 MG tablet Take 50 mg by mouth daily.   spironolactone (ALDACTONE) 25 MG tablet Take 25 mg by mouth daily. Take 1 Tablet Daily   torsemide (DEMADEX) 20 MG tablet Take 2 tablets (40 mg total) by mouth daily.   traZODone (DESYREL) 50 MG tablet Take 1 tablet (50 mg total) by mouth at bedtime.   warfarin (COUMADIN) 7.5 MG tablet Take 1-2 tablets (7.5-15 mg total) by mouth daily as directed by Coumadin Clinic  (Patient taking differently: Take 7.5-15 mg by mouth daily. Take 15 mg by mouth everyday except on Sundays take 7.'5mg'$  per patient)   [DISCONTINUED] furosemide (LASIX) 80 MG tablet Take 1 tablet (80 mg total) by mouth 2 (two) times daily.   [DISCONTINUED] spironolactone (ALDACTONE) 25 MG tablet Take 0.5 tablets (12.5 mg total) by mouth daily. (Patient taking differently: Take 25 mg by mouth daily.)     Allergies:   Fish allergy, Shellfish allergy, Metformin and related, Trulicity [dulaglutide], Nsaids, and Advil [ibuprofen]   Social History   Socioeconomic History   Marital status: Single    Spouse name: Not on file   Number of children: Not on file   Years of education: Not on file   Highest education level: Not on file  Occupational History   Not on file  Tobacco Use   Smoking status: Never   Smokeless tobacco: Never   Tobacco comments:    Never smoke 10/22/21  Vaping Use   Vaping Use: Never used  Substance and Sexual Activity   Alcohol use: No    Alcohol/week: 0.0 standard drinks of alcohol   Drug use: No   Sexual activity: Yes  Other Topics Concern   Not on file  Social History Narrative   Not on file   Social Determinants of Health   Financial Resource Strain: High Risk (10/17/2019)   Overall Financial Resource Strain (CARDIA)    Difficulty of Paying Living Expenses: Hard  Food Insecurity: No Food Insecurity (08/07/2022)   Hunger Vital Sign    Worried About Running Out of Food in the Last Year: Never true    Ran Out of Food in the Last Year: Never true  Transportation Needs: Unmet Transportation Needs (08/07/2022)   PRAPARE - Hydrologist (Medical): Yes    Lack of Transportation (Non-Medical): No  Physical Activity: Insufficiently Active (08/19/2020)   Exercise Vital Sign    Days of Exercise per Week: 7 days    Minutes of Exercise per Session: 20 min  Stress: Stress Concern Present (08/19/2020)   [place]    Feeling of Stress : Very much  Social Connections: Not on file     Family History: The patient's family history includes Diabetes in her father and mother; Hypertension in her father and mother.  ROS:   Please see the history of present illness.     EKGs/Labs/Other Studies Reviewed:  EKG:  The ekg ordered today demonstrates sinus tachycardia with heart rate 113, left axis deviation, LVH, T wave abnormality -no significant change from prior.  Recent Labs: 06/25/2022: TSH 1.887 08/05/2022: ALT 13; B Natriuretic Peptide 1,231.0 08/09/2022: BUN 10; Creatinine, Ser 1.10; Hemoglobin 12.0; Magnesium 1.8; Platelets 222; Potassium 3.5; Sodium 134   Recent Lipid Panel Lab Results  Component Value Date/Time   CHOL 222 (H) 09/18/2021 03:28 AM   CHOL 266 (H) 10/20/2017 10:54 AM   TRIG 97 09/18/2021 03:28 AM   HDL 43 09/18/2021 03:28 AM   HDL 43 10/20/2017 10:54 AM   LDLCALC 160 (H) 09/18/2021 03:28 AM   LDLCALC 195 (H) 10/20/2017 10:54 AM   LDLDIRECT 154 (H) 01/20/2014 03:22 PM    Physical Exam:    VS:  BP (!) 148/88   Pulse (!) 113   Ht '4\' 11"'$  (1.499 m)   Wt 179 lb (81.2 kg)   LMP 05/07/2022 Comment: irreg LMP  SpO2 98%   BMI 36.15 kg/m    No data found.  HYPERTENSION CONTROL Vitals:   08/17/22 1004 08/17/22 1258  BP: (!) 152/88 (!) 148/88    The patient's blood pressure is elevated above target today.  In order to address the patient's elevated BP: A current anti-hypertensive medication was adjusted today.       Wt Readings from Last 3 Encounters:  08/17/22 179 lb (81.2 kg)  08/09/22 174 lb 8 oz (79.2 kg)  08/05/22 199 lb (90.3 kg)     GEN:  Well nourished, well developed in no acute distress HEENT: Normal NECK: No JVD; No carotid bruits CARDIAC: RRR, no murmurs, rubs, gallops RESPIRATORY:  Clear to auscultation without rales, wheezing or rhonchi  ABDOMEN: Soft, non-tender, non-distended MUSCULOSKELETAL: 1-2+ LE edema  bilaterally to shins SKIN: Tepid and dry NEUROLOGIC:  Alert and oriented PSYCHIATRIC:  Normal affect     ASSESSMENT AND PLAN   Chronic systolic and diastolic heart failure, mildly hypervolemic -2D echo August 2023 with LV thrombus at the apex, EF less than 20%, global hypokinesis, grade 3 diastolic dysfunction, moderately reduced RV systolic function, mild MR, mild to moderate TR -History of hypotension with Entresto -History of medical noncompliance -Possible Lasix tolerance.  As she states she is unable to take Lasix twice daily, will switch her to a more potent diuretic.  Prescribe torsemide 40 mg daily.  Can take extra tablet for leg swelling, weight gain of 3 pounds a day or 5 pounds in 1 week or worsening shortness of breath. -Increase spironolactone to 25 mg daily. -Check BMET in 1 week. -Continue Jardiance 10 mg daily -Continue losartan 50 mg daily --asked patient to bring blood pressure and weight log to next appointment to see if any other medications can be maximized. -Continue Toprol XL 25 mg daily --will defer on titration given her history of low cardiac output. -If she is interested in referral to our cardiac psychologist Dr. Elias Else --wrote his name down for her. -With Dr. Phineas Inches as scheduled in early February.   -Recheck 2D echo once patient's been compliant with her medications x 3 months to determine ICD candidacy/ see if LV thrombus has resolved.  QRS less than 120 ms.   -Recommend she follow-up with GI regarding diabetic gastroparesis so she is able to hold her heart failure medications down.  We will attempt to diurese properly to avoid early satiety/bloating.  LV thrombus -2D echo 02/2022 with 2.2 x 1.4 cm LV thrombus with some mobility; she was switched  from Xarelto to Coumadin. 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 -No aspirin given need for anticoagulation.  Paroxysmal atrial fibrillation -EKG today shows mild sinus tachycardia.  -Continue Coumadin for stroke prevention  Hypertension, BP elevated today -BP elevated secondary to not taking meds this morning.  Though patient says her blood pressure always runs high. -Requested patient bring her blood pressure log to her next appointment. -Meds as above. -Goal BP is <130/80.  Recommend DASH diet (high in vegetables, fruits, low-fat dairy products, whole grains, poultry, fish, and nuts and low in sweets, sugar-sweetened beverages, and red meats), salt restriction and increase physical activity.  Hyperlipidemia with goal LDL less than 70 -LDL 160 in February 2023. -Continue Lipitor 40 mg daily. -Recheck fasting lipids when patient is been compliant with her statin x 3 months. -Discussed cholesterol lowering diets - Mediterranean diet, DASH diet, vegetarian diet, low-carbohydrate diet and avoidance of trans fats.  Discussed healthier choice substitutes.  Nuts, high-fiber foods, and fiber supplements may also improve lipids.     Disposition - Follow-up in 2 weeks with Dr. Harl Bowie as already scheduled.   Medication Adjustments/Labs and Tests Ordered: Current medicines are reviewed at length with the patient today.  Concerns regarding medicines are outlined above.  Orders Placed This Encounter  Procedures   Brain natriuretic peptide   Basic Metabolic Panel (BMET)   EKG 12-Lead   Meds ordered this encounter  Medications   torsemide (DEMADEX) 20 MG tablet    Sig: Take 2 tablets (40 mg total) by mouth daily.    Dispense:  60 tablet    Refill:  3    Patient Instructions  Medication Instructions:  Increase Spironolactone from 12.5 mg to 25 mg ( Take Whole Table Daily). Start Torsemide 40 mg Take 1 Tablet Daily. Stop lasix *If you need a refill on your cardiac medications before your next appointment, please call your  pharmacy*   Lab Work: BNP  To Be Done in 1 Week. If you have labs (blood work) drawn today and your tests are completely normal, you will receive your results only by: [place] (if you have MyChart) OR A paper copy in the mail If you have any lab test that is abnormal or we need to change your treatment, we will call you to review the results.   Testing/Procedures: No Testing   Follow-Up: At [facility], you and your health needs are our priority.  As part of our continuing mission to provide you with exceptional heart care, we have created designated Provider Care Teams.  These Care Teams include your primary Cardiologist (physician) and Advanced Practice Providers (APPs -  Physician Assistants and Nurse Practitioners) who all work together to provide you with the care you need, when you need it.  We recommend signing up for the patient portal called "MyChart".  Sign up information is provided on this After Visit Summary.  MyChart is used to connect with patients for Virtual Visits (Telemedicine).  Patients are able to view lab/test results, encounter notes, upcoming appointments, etc.  Non-urgent messages can be sent to your provider as well.   To learn more about what you can do with MyChart, go to [email or web address].    Your next appointment:   Keep Scheduled Appointment  Provider:   Janina Mayo, MD     Bring Blood Pressure and Weight Log to  Follow up Appointment    Signed, Warren Lacy, PA-C  08/17/2022 1:01 PM    [facility]

## 2022-08-17 ENCOUNTER — Ambulatory Visit: Payer: Medicare Other | Attending: Physician Assistant | Admitting: Physician Assistant

## 2022-08-17 ENCOUNTER — Encounter: Payer: Self-pay | Admitting: Physician Assistant

## 2022-08-17 ENCOUNTER — Other Ambulatory Visit (HOSPITAL_COMMUNITY): Payer: Self-pay

## 2022-08-17 VITALS — BP 148/88 | HR 113 | Ht 59.0 in | Wt 179.0 lb

## 2022-08-17 DIAGNOSIS — I5042 Chronic combined systolic (congestive) and diastolic (congestive) heart failure: Secondary | ICD-10-CM | POA: Insufficient documentation

## 2022-08-17 DIAGNOSIS — I2510 Atherosclerotic heart disease of native coronary artery without angina pectoris: Secondary | ICD-10-CM | POA: Diagnosis not present

## 2022-08-17 DIAGNOSIS — I480 Paroxysmal atrial fibrillation: Secondary | ICD-10-CM | POA: Insufficient documentation

## 2022-08-17 DIAGNOSIS — E785 Hyperlipidemia, unspecified: Secondary | ICD-10-CM | POA: Insufficient documentation

## 2022-08-17 DIAGNOSIS — I513 Intracardiac thrombosis, not elsewhere classified: Secondary | ICD-10-CM

## 2022-08-17 DIAGNOSIS — E1169 Type 2 diabetes mellitus with other specified complication: Secondary | ICD-10-CM | POA: Diagnosis not present

## 2022-08-17 MED ORDER — TORSEMIDE 20 MG PO TABS
40.0000 mg | ORAL_TABLET | Freq: Every day | ORAL | 3 refills | Status: DC
  Filled 2022-08-17: qty 60, 30d supply, fill #0

## 2022-08-17 NOTE — Patient Instructions (Addendum)
Medication Instructions:  Increase Spironolactone from 12.5 mg to 25 mg ( Take Whole Table Daily). Start Torsemide 40 mg Take 1 Tablet Daily. Stop lasix *If you need a refill on your cardiac medications before your next appointment, please call your pharmacy*   Lab Work: BNP  To Be Done in 1 Week. If you have labs (blood work) drawn today and your tests are completely normal, you will receive your results only by: [place] (if you have MyChart) OR A paper copy in the mail If you have any lab test that is abnormal or we need to change your treatment, we will call you to review the results.   Testing/Procedures: No Testing   Follow-Up: At [facility], you and your health needs are our priority.  As part of our continuing mission to provide you with exceptional heart care, we have created designated Provider Care Teams.  These Care Teams include your primary Cardiologist (physician) and Advanced Practice Providers (APPs -  Physician Assistants and Nurse Practitioners) who all work together to provide you with the care you need, when you need it.  We recommend signing up for the patient portal called "MyChart".  Sign up information is provided on this After Visit Summary.  MyChart is used to connect with patients for Virtual Visits (Telemedicine).  Patients are able to view lab/test results, encounter notes, upcoming appointments, etc.  Non-urgent messages can be sent to your provider as well.   To learn more about what you can do with MyChart, go to [email or web address].    Your next appointment:   Keep Scheduled Appointment  Provider:   Janina Mayo, MD     Bring Blood Pressure and Weight Log to Follow up Appointment

## 2022-08-25 ENCOUNTER — Other Ambulatory Visit (HOSPITAL_COMMUNITY): Payer: Self-pay

## 2022-08-31 ENCOUNTER — Ambulatory Visit: Payer: Medicare Other | Attending: Internal Medicine | Admitting: Internal Medicine

## 2022-09-08 ENCOUNTER — Other Ambulatory Visit (HOSPITAL_COMMUNITY): Payer: Self-pay

## 2022-09-08 ENCOUNTER — Ambulatory Visit: Payer: Medicare Other | Attending: Internal Medicine | Admitting: Internal Medicine

## 2022-09-08 VITALS — BP 126/64 | HR 114 | Ht 59.0 in | Wt 193.8 lb

## 2022-09-08 DIAGNOSIS — I513 Intracardiac thrombosis, not elsewhere classified: Secondary | ICD-10-CM | POA: Insufficient documentation

## 2022-09-08 MED ORDER — TORSEMIDE 40 MG PO TABS
40.0000 mg | ORAL_TABLET | Freq: Two times a day (BID) | ORAL | 3 refills | Status: DC
  Filled 2022-09-08: qty 180, 90d supply, fill #0

## 2022-09-08 MED ORDER — ATORVASTATIN CALCIUM 80 MG PO TABS
80.0000 mg | ORAL_TABLET | Freq: Every day | ORAL | 3 refills | Status: DC
  Filled 2022-09-08: qty 90, 90d supply, fill #0

## 2022-09-08 NOTE — Patient Instructions (Signed)
Medication Instructions:  Your physician has recommended you make the following change in your medication:  INCREASE: Torsemide 78m twice daily  INCREASE: Lipitor 868mdaily.  *If you need a refill on your cardiac medications before your next appointment, please call your pharmacy*   Lab Work: NONE If you have labs (blood work) drawn today and your tests are completely normal, you will receive your results only by: [place] have [place]Chart) OR A paper copy in the mail If you have any lab test that is abnormal or we need to change your treatment, we will call you to review the results.   Testing/Procedures: Your physicians wants you to have an limited echocardiogram performed In 3 months.    Follow-Up: [facility] are our [facility].  As part of our continuing mission to provide you with exceptional heart care, we have created designated Provider Care Teams.  These Care Teams include your primary Cardiologist (physician) and Advanced Practice Providers (APPs -  Physician Assistants and Nurse Practitioners) who all work together to provide you with the care you need, when you need it.  We recommend signing up for the patient portal called "MyChart".  Sign up information is provided on this After Visit Summary.  MyChart is used to connect with patients for Virtual Visits (Telemedicine).  Patients are able to view lab/test results, encounter notes, upcoming appointments, etc.  Non-urgent messages can be sent to your provider as well.   To learn more about what you can do with MyChart, go to [email or web address]   Your next appointment:    Next available with Pharm-D  6 months with Dr. BrHarl Bowie

## 2022-09-08 NOTE — Progress Notes (Signed)
Cardiology Office Note:    Date:  09/08/2022   ID:  [name], DOB 18-Jun-1976, MRN [id number]  PCP:  Donney Dice, [facility] Cardiologist:  Loralie Champagne, MD Electrophysiologist:  Will Meredith Leeds, MD     Referring MD: Darci Current, DO   No chief complaint on file. Hospital FU  History of Present Illness:    [name] is a 47 y.o. 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%) felt to be nonischemic cardiomyopathy, paroxysmal atrial fibrillation on Xarelto, type 2 DM, anemia, HTN. Shewas initially diagnosed with nonischemic cardiomyopathy in 12/2018. Echocardiogram at that time showed EF 0000000, diastolic function could not be evaluated as patient was in atrial fibrillation at the time of the study. She was seen by EP in 12/2018 as there were concerns that her cardiomyopathy was tachy induced. Patient was started on GDMT and her afib regiment was optimized. Follow up echocardiogram 3 months later in 03/2019 showed EF 0000000,  diastolic parameters consistent with restrictive filling, LV diffuse hypokinesis, apical thrombus, moderate RV systolic dysfunction, moderate TR, mild MR. As her EF had continued to be low, she underwent R/L heart catheterization on 03/29/2019 to rule out ischemia. Cath showed no obstructive CAD, R>L heart failure with low PAPI suggesting significant RV dysfunction, primarily pulmonary venous HTN, low cardiac output, CI 2.07. Patient was started on coumadin for LV thrombus. She was following with the HF clinic.  After her cath, a follow up echo in 09/2019 showed EF <20%, moderate LVH, grade II diastolic dysfunction, severe RV dysfunction, moderately elevated pulmonary artery systolic pressure. Echo from 12/2019 did show some improvement in EF to 35-40%, grade I DD.    Patient was admitted to the hospital in 08/2021 for evaluation of CHF and chest pain. Echo from 09/18/21 showed EF 20%, grade III diastolic  dysfunction, elevated LVEDP, severely reduced RV systolic function, moderately elevated PA systolic pressure, mild MR. Underwent R/L heart catheterization on 09/20/21 that showed mild, nonobstructive CAD, nonischemic cardiomyopathy, elevated right and left heart filling pressures, preserved cardiac output, pulmonary venous htn. At that time, Dr. Aundra Dubin believed that her cardiomyopathy could be caused by poorly controlled DM or HTN. Patient underwent Cardiac MRI to rule out myocarditis on 09/20/21. Study showed mildly dilated LV with EF 23%, diffuse hypokinesis with inferior akinesis, moderate RV systolic dysfunction with EF 32%, no definitive evidence of prior MI, infiltrative disease, myocarditis. LV thrombus was not seen on cMRI, continued on coumadin for prevention. Later, patient was transitioned to Xarelto due to poor compliance with coumadin and subtherapeutic INRs. She was reportedly rude to staff and was dismissed from HF clinic.   She's had recurrent admissions thought 2/2 difficulty with medication compliance.   Patient was last seen by cardiology on 02/02/22 for an outpatient appointment. At that time, patient admitted to medication noncompliance. She had visited the ER for diffuse body pain and CHF, but she left before being seen. Encouraged medication compliance and diuretic use. She underwent CT chest negative for acute pulmonary embolus, ovoid hypodensity/filling defect within the left ventricular apex (needs echo to exclude LV thrombus), cardiomegaly with reflux of contrast into the hepatic veins consistent with elevated right heart pressures, heterogenous enhancement of the liver likely due to passive congestion. A repeat echo 02/23/2022 showed 2.2 x 1.4 cm thrombus in the LV with EF < 20%. RV function was moderately reduced. RVSP 44 mmhg. Mild MR. She was diuresed and continued on xarelto. Plan was  for to ensure FU before starting coumadin again    Today, She is having chest pressure under the  breast. It feels like a pin. She has no PND/orthopnea. No LE edema. She reports no N/V with blood tinge. She is unsure if she is pregnant.   Interim hx 09/08/2022 INR has been 2-3 for a month. Notes leg swelling.   Past Medical History:  Diagnosis Date   Abnormal Pap smear    Acute combined systolic and diastolic heart failure ([place]) 02/19/2019   Wt Readings from Last 3 Encounters:  09/18/21  84.7 kg  09/02/21  84.4 kg  08/31/21  84.2 kg         Acute heart failure ([place]) 01/29/2022   Acute on chronic combined systolic and diastolic CHF (congestive heart failure) ([place]) 02/19/2019   Acute on chronic congestive heart failure (H[facility])    Acute on chronic congestive heart failure (HCC)    Acute on chronic diastolic CHF (congestive heart failure) ([place]) 10/19/2021   Acute on chronic systolic heart failure ([place]) 02/22/2022   Anemia    Asthma    Atrial fibrillation ([place])    history of paroxysmal atrial fibrillation   CHF (congestive heart failure) ([place]) 11/02/2021   CHF exacerbation ([place]) 10/17/2021   Diabetes mellitus    DKA, type 2 ([place]) 02/06/2019   Elevated troponin 09/30/2019   Heart failure with reduced ejection fraction ([place])    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     Migraines    Morbid obesity ([place]) 02/06/2019   Nausea & vomiting 06/26/2022   Nonischemic cardiomyopathy ([place]) 09/22/2021   NSTEMI (non-ST elevated myocardial infarction) ([place]) 02/23/2022   Obstructive sleep apnea 06/30/2015   Polycystic ovarian disease     Past Surgical History:  Procedure Laterality Date   BIOPSY  11/05/2021   Procedure: BIOPSY;   Surgeon: Daryel November, MD;  Location: [facility] ENDOSCOPY;  Service: Gastroenterology;;   CERVIX LESION DESTRUCTION     CESAREAN SECTION     ESOPHAGOGASTRODUODENOSCOPY (EGD) WITH PROPOFOL N/A 11/05/2021   Procedure: ESOPHAGOGASTRODUODENOSCOPY (EGD) WITH PROPOFOL;  Surgeon: Daryel November, MD;  Location: [facility] ENDOSCOPY;  Service: Gastroenterology;  Laterality: N/A;   RIGHT/LEFT HEART CATH AND CORONARY ANGIOGRAPHY N/A 03/29/2019   Procedure: RIGHT/LEFT HEART CATH AND CORONARY ANGIOGRAPHY;  Surgeon: Larey Dresser, MD;  Location: [facility] LAB;  Service: Cardiovascular;  Laterality: N/A;   RIGHT/LEFT HEART CATH AND CORONARY ANGIOGRAPHY N/A 09/20/2021   Procedure: RIGHT/LEFT HEART CATH AND CORONARY ANGIOGRAPHY;  Surgeon: Larey Dresser, MD;  Location: [facility] CV LAB;  Service: Cardiovascular;  Laterality: N/A;    Current Medications: Current Meds  Medication Sig   Accu-Chek Softclix Lancets lancets Use as instructed   Accu-Chek Softclix Lancets lancets Use up 4 (four) times daily as directed   acetaminophen (TYLENOL) 500 MG tablet Take 500-1,000 mg by mouth every 6 (six) hours as needed for mild pain or headache.   blood glucose meter kit and supplies Use up to 4 (four) times daily as directed   empagliflozin (JARDIANCE) 10 MG TABS tablet Take 1 tablet (10 mg total) by mouth daily.  ferrous sulfate 325 (65 FE) MG tablet Take 1 tablet (325 mg total) by mouth daily with breakfast.   glucose blood (ONETOUCH VERIO) test strip Use up to 4 (four) times daily as directed   hydrOXYzine (ATARAX) 10 MG tablet Take 1 tablet (10 mg total) by mouth 3 (three) times daily as needed. (Patient taking differently: Take 10 mg by mouth 3 (three) times daily as needed for anxiety.)   insulin aspart (NOVOLOG FLEXPEN) 100 UNIT/ML FlexPen Inject 12 Units into the skin daily before supper.   insulin glargine (LANTUS) 100 UNIT/ML Solostar Pen Inject 15 Units into the skin daily.   Insulin Pen Needle (UNIFINE  PENTIPS) 31G X 5 MM MISC USE AS DIRECTED 2 TIMES DAILY   losartan (COZAAR) 50 MG tablet Take 1 tablet (50 mg total) by mouth daily.   metoCLOPramide (REGLAN) 5 MG tablet Take 1 tablet (5 mg total) by mouth 4 (four) times daily -  before meals and at bedtime.   metoprolol succinate (TOPROL-XL) 25 MG 24 hr tablet Take 1 tablet (25 mg total) by mouth daily.   polyethylene glycol (MIRALAX / GLYCOLAX) 17 g packet Take 17 g by mouth daily as needed for mild constipation.   prochlorperazine (COMPAZINE) 5 MG tablet Take 1 tablet (5 mg total) by mouth every 6 (six) hours as needed for refractory nausea / vomiting.   senna-docusate (SENOKOT-S) 8.6-50 MG tablet Take 1 tablet by mouth at bedtime as needed for moderate constipation.   sertraline (ZOLOFT) 50 MG tablet Take 50 mg by mouth daily.   spironolactone (ALDACTONE) 25 MG tablet Take 25 mg by mouth daily. Take 1 Tablet Daily   traZODone (DESYREL) 50 MG tablet Take 1 tablet (50 mg total) by mouth at bedtime.   warfarin (COUMADIN) 7.5 MG tablet Take 1-2 tablets (7.5-15 mg total) by mouth daily as directed by Coumadin Clinic (Patient taking differently: Take 7.5-15 mg by mouth daily. Take 15 mg by mouth everyday except on Sundays take 7.45m per patient)   [DISCONTINUED] torsemide (DEMADEX) 20 MG tablet Take 2 tablets (40 mg total) by mouth daily.     Allergies:   Fish allergy, Shellfish allergy, Metformin and related, Trulicity [dulaglutide], Nsaids, and Advil [ibuprofen]   Social History   Socioeconomic History   Marital status: Single    Spouse name: Not on file   Number of children: Not on file   Years of education: Not on file   Highest education level: Not on file  Occupational History   Not on file  Tobacco Use   Smoking status: Never   Smokeless tobacco: Never   Tobacco comments:    Never smoke 10/22/21  Vaping Use   Vaping Use: Never used  Substance and Sexual Activity   Alcohol use: No    Alcohol/week: 0.0 standard drinks of alcohol    Drug use: No   Sexual activity: Yes  Other Topics Concern   Not on file  Social History Narrative   Not on file   Social Determinants of Health   Financial Resource Strain: High Risk (10/17/2019)   Overall Financial Resource Strain (CARDIA)    Difficulty of Paying Living Expenses: Hard  Food Insecurity: No Food Insecurity (08/07/2022)   Hunger Vital Sign    Worried About Running Out of Food in the Last Year: Never true    Ran Out of Food in the Last Year: Never true  Transportation Needs: Unmet Transportation Needs (08/07/2022)   PRAPARE - Transportation    Lack of Transportation (  Medical): Yes    Lack of Transportation (Non-Medical): No  Physical Activity: Insufficiently Active (08/19/2020)   Exercise Vital Sign    Days of Exercise per Week: 7 days    Minutes of Exercise per Session: 20 min  Stress: Stress Concern Present (08/19/2020)   [place]    Feeling of Stress : Very much  Social Connections: Not on file     Family History: The patient's family history includes Diabetes in her father and mother; Hypertension in her father and mother.  ROS:   Please see the history of present illness.     All other systems reviewed and are negative.  EKGs/Labs/Other Studies Reviewed:    The following studies were reviewed today:   EKG:  none today  Recent Labs: 06/25/2022: TSH 1.887 08/05/2022: ALT 13; B Natriuretic Peptide 1,231.0 08/09/2022: BUN 10; Creatinine, Ser 1.10; Hemoglobin 12.0; Magnesium 1.8; Platelets 222; Potassium 3.5; Sodium 134   Recent Lipid Panel    Component Value Date/Time   CHOL 222 (H) 09/18/2021 0328   CHOL 266 (H) 10/20/2017 1054   TRIG 97 09/18/2021 0328   HDL 43 09/18/2021 0328   HDL 43 10/20/2017 1054   CHOLHDL 5.2 09/18/2021 0328   VLDL 19 09/18/2021 0328   LDLCALC 160 (H) 09/18/2021 0328   LDLCALC 195 (H) 10/20/2017 1054   LDLDIRECT 154 (H) 01/20/2014 1522     Risk  Assessment/Calculations:        Physical Exam:    VS:  BP 126/64   Pulse (!) 114   Ht 4' 11"$  (1.499 m)   Wt 193 lb 12.8 oz (87.9 kg)   LMP 05/07/2022 Comment: irreg LMP  SpO2 96%   BMI 39.14 kg/m     Wt Readings from Last 3 Encounters:  09/08/22 193 lb 12.8 oz (87.9 kg)  08/17/22 179 lb (81.2 kg)  08/09/22 174 lb 8 oz (79.2 kg)     GEN:  Well nourished, well developed in no acute distress HEENT: Normal NECK: No sig JVD LYMPHATICS: No lymphadenopathy CARDIAC: RRR, no murmurs, rubs, gallops RESPIRATORY:  Clear to auscultation without rales, wheezing or rhonchi  ABDOMEN: Soft, non-tender, non-distended MUSCULOSKELETAL:  + LE edema SKIN: Warm and dry NEUROLOGIC:  Alert and oriented x 3 PSYCHIATRIC:  flat affect   ASSESSMENT:    NYHA Class II-III Non Ischemic CM: Will continue GDMT.  She had low Bps in the past with entresto. Once LV thrombus resolves, will consider primary prevention ICD (subQ?). Hgb normal. - has significant leg swelling will increase her diuretic -continue coreg 3.125 mg BID -continue losartan 25 mg daily -continue spironolactone 12.5 mg daily  -continue jardiance 10 mg daily -change torsemide to 40 daily to BID; from lasix 80/40  HLD: on atorvastatin 40 mg daily, LDL 160 09/18/2021, increase to lipitor 80 mg daily  LV thrombus - continue coumadin, therapeutic for only 1 month - established with coumadin clinic today - recheck echo in 3 months with therapeutic INR  Afib: chads2vasc=4 -continue digoxin  DM2: A1c > 15;  on jardiance 44m daily; pharmacy referral   PLAN:    In order of problems listed above:   TTE in 3 months , Limited Torsemide 40 mg BID Increase lipitor to 80 mg daily Pharmacy visit for DM2, next available Return in 6 months      Medication Adjustments/Labs and Tests Ordered: Current medicines are reviewed at length with the patient today.  Concerns regarding medicines are outlined above.  Orders Placed This  Encounter  Procedures   ECHOCARDIOGRAM LIMITED   Meds ordered this encounter  Medications   atorvastatin (LIPITOR) 80 MG tablet    Sig: Take 1 tablet (80 mg total) by mouth daily.    Dispense:  90 tablet    Refill:  3   torsemide 40 MG TABS    Sig: Take 40 mg by mouth 2 (two) times daily.    Dispense:  180 tablet    Refill:  3    Patient Instructions  Medication Instructions:  Your physician has recommended you make the following change in your medication:  INCREASE: Torsemide 43m twice daily  INCREASE: Lipitor 875mdaily.  *If you need a refill on your cardiac medications before your next appointment, please call your pharmacy*   Lab Work: NONE If you have labs (blood work) drawn today and your tests are completely normal, you will receive your results only by: MyOctaviaif you have MyChart) OR A paper copy in the mail If you have any lab test that is abnormal or we need to change your treatment, we will call you to review the results.   Testing/Procedures: Your physicians wants you to have an limited echocardiogram performed In 3 months.    Follow-Up: [facility] our [facility].  As part of our continuing mission to provide you with exceptional heart care, we have created designated Provider Care Teams.  These Care Teams include your primary Cardiologist (physician) and Advanced Practice Providers (APPs -  Physician Assistants and Nurse Practitioners) who all work together to provide you with the care you need, when you need it.  We recommend signing up for the patient portal called "MyChart".  Sign up information is provided on this After Visit Summary.  MyChart is used to connect with patients for Virtual Visits (Telemedicine).  Patients are able to view lab/test results, encounter notes, upcoming appointments, etc.  Non-urgent messages can be sent to your provider as well.   To learn more about what you can do with  MyChart, go to [email or web address]   Your next appointment:    Next available with Pharm-D  6 months with Dr. BrHarl Bowie  Signed, BrJanina MayoMD  09/08/2022 12:06 PM    [place]

## 2022-09-13 ENCOUNTER — Other Ambulatory Visit (HOSPITAL_COMMUNITY): Payer: Self-pay

## 2022-09-15 ENCOUNTER — Other Ambulatory Visit (HOSPITAL_COMMUNITY): Payer: Self-pay

## 2022-09-22 ENCOUNTER — Emergency Department (HOSPITAL_COMMUNITY): Payer: Medicare Other

## 2022-09-22 ENCOUNTER — Other Ambulatory Visit: Payer: Self-pay

## 2022-09-22 ENCOUNTER — Encounter (HOSPITAL_COMMUNITY): Payer: Self-pay

## 2022-09-22 ENCOUNTER — Inpatient Hospital Stay (HOSPITAL_COMMUNITY)
Admission: EM | Admit: 2022-09-22 | Discharge: 2022-10-01 | DRG: 252 | Disposition: A | Discharge status: Rehabilitation Facility | Payer: Medicare Other | Attending: Neurology | Admitting: Neurology

## 2022-09-22 DIAGNOSIS — I444 Left anterior fascicular block: Secondary | ICD-10-CM | POA: Diagnosis present

## 2022-09-22 DIAGNOSIS — E1169 Type 2 diabetes mellitus with other specified complication: Secondary | ICD-10-CM | POA: Diagnosis present

## 2022-09-22 DIAGNOSIS — E1159 Type 2 diabetes mellitus with other circulatory complications: Secondary | ICD-10-CM | POA: Diagnosis present

## 2022-09-22 DIAGNOSIS — Z86711 Personal history of pulmonary embolism: Secondary | ICD-10-CM

## 2022-09-22 DIAGNOSIS — I6601 Occlusion and stenosis of right middle cerebral artery: Secondary | ICD-10-CM | POA: Diagnosis present

## 2022-09-22 DIAGNOSIS — F32A Depression, unspecified: Secondary | ICD-10-CM | POA: Diagnosis present

## 2022-09-22 DIAGNOSIS — I110 Hypertensive heart disease with heart failure: Principal | ICD-10-CM | POA: Diagnosis present

## 2022-09-22 DIAGNOSIS — I252 Old myocardial infarction: Secondary | ICD-10-CM

## 2022-09-22 DIAGNOSIS — R414 Neurologic neglect syndrome: Secondary | ICD-10-CM | POA: Diagnosis present

## 2022-09-22 DIAGNOSIS — E1165 Type 2 diabetes mellitus with hyperglycemia: Secondary | ICD-10-CM | POA: Diagnosis present

## 2022-09-22 DIAGNOSIS — I272 Pulmonary hypertension, unspecified: Secondary | ICD-10-CM | POA: Diagnosis present

## 2022-09-22 DIAGNOSIS — N179 Acute kidney failure, unspecified: Secondary | ICD-10-CM | POA: Diagnosis present

## 2022-09-22 DIAGNOSIS — Z5982 Transportation insecurity: Secondary | ICD-10-CM

## 2022-09-22 DIAGNOSIS — I2510 Atherosclerotic heart disease of native coronary artery without angina pectoris: Secondary | ICD-10-CM | POA: Diagnosis present

## 2022-09-22 DIAGNOSIS — R7989 Other specified abnormal findings of blood chemistry: Secondary | ICD-10-CM | POA: Diagnosis present

## 2022-09-22 DIAGNOSIS — I428 Other cardiomyopathies: Secondary | ICD-10-CM | POA: Diagnosis present

## 2022-09-22 DIAGNOSIS — I468 Cardiac arrest due to other underlying condition: Secondary | ICD-10-CM | POA: Diagnosis present

## 2022-09-22 DIAGNOSIS — F419 Anxiety disorder, unspecified: Secondary | ICD-10-CM | POA: Diagnosis present

## 2022-09-22 DIAGNOSIS — I472 Ventricular tachycardia, unspecified: Secondary | ICD-10-CM | POA: Diagnosis not present

## 2022-09-22 DIAGNOSIS — I619 Nontraumatic intracerebral hemorrhage, unspecified: Secondary | ICD-10-CM | POA: Diagnosis present

## 2022-09-22 DIAGNOSIS — G8194 Hemiplegia, unspecified affecting left nondominant side: Secondary | ICD-10-CM | POA: Diagnosis present

## 2022-09-22 DIAGNOSIS — Z833 Family history of diabetes mellitus: Secondary | ICD-10-CM

## 2022-09-22 DIAGNOSIS — I480 Paroxysmal atrial fibrillation: Secondary | ICD-10-CM | POA: Diagnosis present

## 2022-09-22 DIAGNOSIS — R791 Abnormal coagulation profile: Secondary | ICD-10-CM | POA: Diagnosis present

## 2022-09-22 DIAGNOSIS — E1143 Type 2 diabetes mellitus with diabetic autonomic (poly)neuropathy: Secondary | ICD-10-CM | POA: Diagnosis present

## 2022-09-22 DIAGNOSIS — Z7984 Long term (current) use of oral hypoglycemic drugs: Secondary | ICD-10-CM

## 2022-09-22 DIAGNOSIS — Z6841 Body Mass Index (BMI) 40.0 and over, adult: Secondary | ICD-10-CM

## 2022-09-22 DIAGNOSIS — R29716 NIHSS score 16: Secondary | ICD-10-CM | POA: Diagnosis present

## 2022-09-22 DIAGNOSIS — J90 Pleural effusion, not elsewhere classified: Secondary | ICD-10-CM

## 2022-09-22 DIAGNOSIS — R531 Weakness: Secondary | ICD-10-CM | POA: Diagnosis not present

## 2022-09-22 DIAGNOSIS — E1122 Type 2 diabetes mellitus with diabetic chronic kidney disease: Secondary | ICD-10-CM | POA: Diagnosis present

## 2022-09-22 DIAGNOSIS — Z886 Allergy status to analgesic agent status: Secondary | ICD-10-CM

## 2022-09-22 DIAGNOSIS — E785 Hyperlipidemia, unspecified: Secondary | ICD-10-CM | POA: Diagnosis present

## 2022-09-22 DIAGNOSIS — Z79899 Other long term (current) drug therapy: Secondary | ICD-10-CM

## 2022-09-22 DIAGNOSIS — I152 Hypertension secondary to endocrine disorders: Secondary | ICD-10-CM | POA: Diagnosis present

## 2022-09-22 DIAGNOSIS — I2489 Other forms of acute ischemic heart disease: Secondary | ICD-10-CM | POA: Diagnosis present

## 2022-09-22 DIAGNOSIS — I63411 Cerebral infarction due to embolism of right middle cerebral artery: Secondary | ICD-10-CM | POA: Diagnosis not present

## 2022-09-22 DIAGNOSIS — Z91148 Patient's other noncompliance with medication regimen for other reason: Secondary | ICD-10-CM

## 2022-09-22 DIAGNOSIS — R110 Nausea: Secondary | ICD-10-CM | POA: Diagnosis present

## 2022-09-22 DIAGNOSIS — E876 Hypokalemia: Secondary | ICD-10-CM | POA: Diagnosis present

## 2022-09-22 DIAGNOSIS — G4733 Obstructive sleep apnea (adult) (pediatric): Secondary | ICD-10-CM | POA: Diagnosis present

## 2022-09-22 DIAGNOSIS — Z91013 Allergy to seafood: Secondary | ICD-10-CM

## 2022-09-22 DIAGNOSIS — R601 Generalized edema: Secondary | ICD-10-CM

## 2022-09-22 DIAGNOSIS — J9601 Acute respiratory failure with hypoxia: Secondary | ICD-10-CM | POA: Diagnosis present

## 2022-09-22 DIAGNOSIS — R29810 Facial weakness: Secondary | ICD-10-CM | POA: Diagnosis present

## 2022-09-22 DIAGNOSIS — I700 Atherosclerosis of aorta: Secondary | ICD-10-CM | POA: Diagnosis present

## 2022-09-22 DIAGNOSIS — Z794 Long term (current) use of insulin: Secondary | ICD-10-CM

## 2022-09-22 DIAGNOSIS — Z8249 Family history of ischemic heart disease and other diseases of the circulatory system: Secondary | ICD-10-CM

## 2022-09-22 DIAGNOSIS — Z7901 Long term (current) use of anticoagulants: Secondary | ICD-10-CM

## 2022-09-22 DIAGNOSIS — I513 Intracardiac thrombosis, not elsewhere classified: Secondary | ICD-10-CM | POA: Diagnosis present

## 2022-09-22 DIAGNOSIS — I5043 Acute on chronic combined systolic (congestive) and diastolic (congestive) heart failure: Secondary | ICD-10-CM | POA: Diagnosis present

## 2022-09-22 DIAGNOSIS — Z888 Allergy status to other drugs, medicaments and biological substances status: Secondary | ICD-10-CM

## 2022-09-22 DIAGNOSIS — R112 Nausea with vomiting, unspecified: Secondary | ICD-10-CM

## 2022-09-22 LAB — BLOOD GAS, VENOUS
Acid-Base Excess: 4.8 mmol/L — ABNORMAL HIGH (ref 0.0–2.0)
Bicarbonate: 28.1 mmol/L — ABNORMAL HIGH (ref 20.0–28.0)
O2 Saturation: 80.3 %
Patient temperature: 37
pCO2, Ven: 36 mmHg — ABNORMAL LOW (ref 44–60)
pH, Ven: 7.5 — ABNORMAL HIGH (ref 7.25–7.43)
pO2, Ven: 50 mmHg — ABNORMAL HIGH (ref 32–45)

## 2022-09-22 LAB — BRAIN NATRIURETIC PEPTIDE: B Natriuretic Peptide: 688.8 pg/mL — ABNORMAL HIGH (ref 0.0–100.0)

## 2022-09-22 LAB — CBC WITH DIFFERENTIAL/PLATELET
Abs Immature Granulocytes: 0.02 10*3/uL (ref 0.00–0.07)
Basophils Absolute: 0 10*3/uL (ref 0.0–0.1)
Basophils Relative: 1 %
Eosinophils Absolute: 0 10*3/uL (ref 0.0–0.5)
Eosinophils Relative: 0 %
HCT: 41.3 % (ref 36.0–46.0)
Hemoglobin: 13 g/dL (ref 12.0–15.0)
Immature Granulocytes: 0 %
Lymphocytes Relative: 24 %
Lymphs Abs: 2.1 10*3/uL (ref 0.7–4.0)
MCH: 22.4 pg — ABNORMAL LOW (ref 26.0–34.0)
MCHC: 31.5 g/dL (ref 30.0–36.0)
MCV: 71.1 fL — ABNORMAL LOW (ref 80.0–100.0)
Monocytes Absolute: 0.5 10*3/uL (ref 0.1–1.0)
Monocytes Relative: 6 %
Neutro Abs: 6 10*3/uL (ref 1.7–7.7)
Neutrophils Relative %: 69 %
Platelets: 232 10*3/uL (ref 150–400)
RBC: 5.81 MIL/uL — ABNORMAL HIGH (ref 3.87–5.11)
RDW: 24.5 % — ABNORMAL HIGH (ref 11.5–15.5)
WBC: 8.7 10*3/uL (ref 4.0–10.5)
nRBC: 0 % (ref 0.0–0.2)

## 2022-09-22 LAB — PROTIME-INR
INR: 1.3 — ABNORMAL HIGH (ref 0.8–1.2)
Prothrombin Time: 16 s — ABNORMAL HIGH (ref 11.4–15.2)

## 2022-09-22 LAB — BASIC METABOLIC PANEL
Anion gap: 12 (ref 5–15)
BUN: 15 mg/dL (ref 6–20)
CO2: 26 mmol/L (ref 22–32)
Calcium: 8.1 mg/dL — ABNORMAL LOW (ref 8.9–10.3)
Chloride: 95 mmol/L — ABNORMAL LOW (ref 98–111)
Creatinine, Ser: 1.01 mg/dL — ABNORMAL HIGH (ref 0.44–1.00)
GFR, Estimated: >60 mL/min (ref >60)
Glucose, Bld: 245 mg/dL — ABNORMAL HIGH (ref 70–99)
Potassium: 3.2 mmol/L — ABNORMAL LOW (ref 3.5–5.1)
Sodium: 133 mmol/L — ABNORMAL LOW (ref 135–145)

## 2022-09-22 LAB — I-STAT CREATININE, ED: Creatinine, Ser: 1 mg/dL (ref 0.44–1.00)

## 2022-09-22 LAB — TROPONIN I (HIGH SENSITIVITY)
Troponin I (High Sensitivity): 197 ng/L (ref <18)
Troponin I (High Sensitivity): 170 ng/L (ref <18)

## 2022-09-22 MED ORDER — IOHEXOL 350 MG/ML SOLN
100.0000 mL | Freq: Once | INTRAVENOUS | Status: AC | PRN
  Administered 2022-09-22: 100 mL via INTRAVENOUS

## 2022-09-22 MED ORDER — FUROSEMIDE 10 MG/ML IJ SOLN
80.0000 mg | Freq: Once | INTRAMUSCULAR | Status: AC
  Administered 2022-09-23: 80 mg via INTRAVENOUS
  Filled 2022-09-22: qty 8

## 2022-09-22 MED ORDER — MORPHINE SULFATE (PF) 4 MG/ML IV SOLN
4.0000 mg | Freq: Once | INTRAVENOUS | Status: AC
  Administered 2022-09-22: 4 mg via INTRAVENOUS
  Filled 2022-09-22: qty 1

## 2022-09-22 MED ORDER — POTASSIUM CHLORIDE CRYS ER 20 MEQ PO TBCR
40.0000 meq | EXTENDED_RELEASE_TABLET | Freq: Once | ORAL | Status: AC
  Administered 2022-09-22: 40 meq via ORAL
  Filled 2022-09-22: qty 2

## 2022-09-22 MED ORDER — ONDANSETRON HCL 4 MG/2ML IJ SOLN
4.0000 mg | Freq: Once | INTRAMUSCULAR | Status: AC
  Administered 2022-09-22: 4 mg via INTRAVENOUS
  Filled 2022-09-22: qty 2

## 2022-09-22 NOTE — ED Provider Notes (Signed)
[place] [facility]MENT AT [facility] Provider Note   CSN: [id number] Arrival date & time: 09/22/22  1933     History  Chief Complaint  Patient presents with   Leg Swelling   Shortness of Breath    [name] is a 47 y.o. 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 She c/o tightness in her chest, increase in her weight, worsening LE edema, and difficulty breathing worsening over the last 2 weeks. She has a known history of heart failure and is on either Lasix or torsemide twice a day.  She believes that she may be on 80 mg twice a day.  She states that her primary care provider changed it approximately 2 weeks ago but she has had no relief in her symptoms with increased dose.  She also reports intractable nausea and vomiting.  She has a history of this and typically takes Zofran at home but has been out for the last few days.  She reports that she is unable to keep anything down including her medications such as Coumadin.  She denies recent fever, cough, congestion, sore throat, body aches, abdominal pain, or diarrhea.  No known sick contacts.   Home Medications Prior to Admission medications   Medication Sig Start Date End Date Taking? Authorizing Provider  Accu-Chek Softclix Lancets lancets Use as instructed 02/12/19   Caroline More, DO  Accu-Chek Softclix Lancets lancets Use up 4 (four) times daily as directed 06/06/22   [place] Donnamarie Poag, DO  acetaminophen (TYLENOL) 500 MG tablet Take 500-1,000 mg by mouth every 6 (six) hours as needed for mild pain or headache.    [provider]  atorvastatin (LIPITOR) 80 MG tablet Take 1 tablet (80 mg total) by mouth daily. 09/08/22   Janina Mayo, MD  blood glucose meter kit and supplies Use up to 4 (four) times daily as directed 06/06/22   [place] Donnamarie Poag, DO  empagliflozin (JARDIANCE) 10 MG TABS tablet Take 1 tablet (10 mg total) by mouth daily. 07/01/22   Amin, Jeanella Flattery, MD  ferrous sulfate 325 (65 FE) MG tablet Take 1 tablet (325 mg total) by mouth daily with breakfast. 07/02/22   Amin, Jeanella Flattery, MD  glucose blood (ONETOUCH VERIO) test strip Use up to 4 (four) times daily as directed 06/06/22   [place] Donnamarie Poag, DO  hydrOXYzine (ATARAX) 10 MG tablet Take 1 tablet (10 mg total) by mouth 3 (three) times daily as needed. Patient taking differently: Take 10 mg by mouth 3 (three) times daily as needed for anxiety. 10/22/21   Gladys Damme, MD  insulin aspart (NOVOLOG FLEXPEN) 100 UNIT/ML FlexPen Inject 12 Units into the skin daily before supper. 07/01/22   Amin, Jeanella Flattery, MD  insulin glargine (LANTUS) 100 UNIT/ML Solostar Pen Inject 15 Units into the skin daily. 07/01/22   Amin, Jeanella Flattery, MD  Insulin Pen Needle (UNIFINE PENTIPS) 31G X 5 MM MISC USE AS DIRECTED 2 TIMES DAILY 09/02/21   Hensel, Jamal Collin, MD  losartan (COZAAR) 50 MG tablet Take 1 tablet (50 mg total) by mouth daily. 07/01/22   Damita Lack, MD  metoCLOPramide (REGLAN) 5 MG tablet Take 1 tablet (5 mg total) by mouth 4 (four) times daily -  before meals and at bedtime. 08/09/22   Precious Gilding, DO  metoprolol succinate (TOPROL-XL) 25 MG 24 hr tablet Take 1 tablet (25 mg total) by mouth daily. 07/01/22   Amin, Jeanella Flattery, MD  polyethylene glycol (MIRALAX / GLYCOLAX) 17 g packet Take 17 g by mouth daily as needed for mild constipation. 06/06/22   [place] Donnamarie Poag, DO  prochlorperazine (COMPAZINE) 5 MG tablet Take 1 tablet (5 mg total) by mouth every 6 (six) hours as needed for refractory nausea / vomiting. 08/09/22   Precious Gilding, DO  senna-docusate (SENOKOT-S)  8.6-50 MG tablet Take 1 tablet by mouth at bedtime as needed for moderate constipation. 07/01/22   Amin, Jeanella Flattery, MD  sertraline (ZOLOFT) 50 MG tablet Take 50 mg by mouth daily.    [provider]  spironolactone (ALDACTONE) 25 MG tablet Take 25 mg by mouth daily. Take 1 Tablet Daily    [provider]  Torsemide 40 MG TABS Take 40 mg by mouth 2 (two) times daily. 09/08/22   Janina Mayo, MD  traZODone (DESYREL) 50 MG tablet Take 1 tablet (50 mg total) by mouth at bedtime. 07/01/22   Amin, Jeanella Flattery, MD  warfarin (COUMADIN) 7.5 MG tablet Take 1-2 tablets (7.5-15 mg total) by mouth daily as directed by [facility] Patient taking differently: Take 7.5-15 mg by mouth daily. Take 15 mg by mouth everyday except on Sundays take 7.'5mg'$  per patient 07/01/22   Damita Lack, MD  carvedilol (COREG) 3.125 MG tablet Take 1 tablet (3.125 mg total) by mouth 2 (two) times daily with a meal. 02/27/22 07/01/22  Oswald Hillock, MD      Allergies    Fish allergy, Shellfish allergy, Metformin and related, Trulicity [dulaglutide], Nsaids, and Advil [ibuprofen]    Review of Systems   Review of Systems  All other systems reviewed and are negative.   Physical Exam Updated Vital Signs BP (!) 147/118   Pulse (!) 104   Temp 97.8 F (36.6 C) (Oral)   Resp (!) 21   Ht '4\' 11"'$  (1.499 m)   Wt 87.9 kg   LMP 06/22/2022 Comment: patient states no chance of pregnancy  SpO2 100%   BMI 39.14 kg/m  Physical Exam Vitals and nursing note reviewed.  Constitutional:      General: She is in acute distress.     Appearance: Normal appearance. She is not toxic-appearing or diaphoretic.  HENT:     Head: Normocephalic and atraumatic.     Mouth/Throat:     Mouth: Mucous membranes are moist.  Eyes:     Extraocular Movements: Extraocular movements intact.     Conjunctiva/sclera: Conjunctivae normal.     Pupils: Pupils are equal, round, and reactive to light.  Cardiovascular:     Rate and Rhythm:  Regular rhythm. Tachycardia present.  Pulmonary:     Effort: Tachypnea and respiratory distress (mild) present.     Breath sounds: No stridor. Decreased breath sounds (mildly) present. No wheezing, rhonchi or rales.  Chest:     Chest wall: No mass, deformity, tenderness, crepitus or edema. There is no dullness to percussion.  Abdominal:     General: Abdomen is flat.     Palpations: Abdomen is soft. There is no mass.     Tenderness: There is no abdominal tenderness. There is no guarding or rebound.     Comments: Slight distension  which appears edematous  Musculoskeletal:        General: Normal range of motion.     Cervical back: Normal range of motion and neck supple.     Right lower leg: Tenderness present. Edema (2+ pitting) present.     Left lower leg: Tenderness present. Edema (2+ pitting) present.     Comments: 2+ DP and PT pulses bilaterally with normal sensation  Skin:    General: Skin is warm and dry.     Capillary Refill: Capillary refill takes less than 2 seconds.  Neurological:     General: No focal deficit present.     Mental Status: She is alert and oriented to person, place, and time. Mental status is at baseline.     Cranial Nerves: No cranial nerve deficit.     Motor: No weakness.  Psychiatric:        Mood and Affect: Mood normal.        Behavior: Behavior normal.     ED Results / Procedures / Treatments   Labs (all labs ordered are listed, but only abnormal results are displayed) Labs Reviewed  BASIC METABOLIC PANEL - Abnormal; Notable for the following components:      Result Value   Sodium 133 (*)    Potassium 3.2 (*)    Chloride 95 (*)    Glucose, Bld 245 (*)    Creatinine, Ser 1.01 (*)    Calcium 8.1 (*)    All other components within normal limits  BRAIN NATRIURETIC PEPTIDE - Abnormal; Notable for the following components:   B Natriuretic Peptide 688.8 (*)    All other components within normal limits  CBC WITH DIFFERENTIAL/PLATELET - Abnormal;  Notable for the following components:   RBC 5.81 (*)    MCV 71.1 (*)    MCH 22.4 (*)    RDW 24.5 (*)    All other components within normal limits  BLOOD GAS, VENOUS - Abnormal; Notable for the following components:   pH, Ven 7.5 (*)    pCO2, Ven 36 (*)    pO2, Ven 50 (*)    Bicarbonate 28.1 (*)    Acid-Base Excess 4.8 (*)    All other components within normal limits  PROTIME-INR - Abnormal; Notable for the following components:   Prothrombin Time 16.0 (*)    INR 1.3 (*)    All other components within normal limits  TROPONIN I (HIGH SENSITIVITY) - Abnormal; Notable for the following components:   Troponin I (High Sensitivity) 197 (*)    All other components within normal limits  TROPONIN I (HIGH SENSITIVITY) - Abnormal; Notable for the following components:   Troponin I (High Sensitivity) 170 (*)    All other components within normal limits  I-STAT CREATININE, ED    EKG EKG Interpretation  Date/Time:  Thursday September 22 2022 19:46:48 EST Ventricular Rate:  110 PR Interval:  132 QRS Duration: 83 QT Interval:  366 QTC Calculation: 496 R Axis:   -60 Text Interpretation: Sinus tachycardia Probable left atrial enlargement Left anterior fascicular block Probable anterior infarct, old No significant change since last tracing Confirmed by Blanchie Dessert [phone]) on 09/22/2022 7:58:59 PM  Radiology CT Angio Chest PE W and/or Wo Contrast  Result Date: 09/22/2022 CLINICAL DATA:  Pulmonary embolism suspected, high probability. Bilateral lower extremity edema and shortness of breath. EXAM: CT ANGIOGRAPHY CHEST WITH CONTRAST TECHNIQUE: Multidetector CT imaging of the chest was performed using the standard protocol during bolus administration of intravenous contrast. Multiplanar CT image  reconstructions and MIPs were obtained to evaluate the vascular anatomy. RADIATION DOSE REDUCTION: This exam was performed according to the departmental dose-optimization program which includes automated  exposure control, adjustment of the mA and/or kV according to patient size and/or use of iterative reconstruction technique. CONTRAST:  113m OMNIPAQUE IOHEXOL 350 MG/ML SOLN COMPARISON:  05/06/2022. FINDINGS: Cardiovascular: The heart is enlarged and there is no pericardial effusion. Scattered coronary artery calcifications are noted. There is atherosclerotic calcification of the aorta without evidence of aneurysm. The pulmonary trunk is normal in caliber. No pulmonary artery filling defect is seen. Mediastinum/Nodes: No mediastinal, hilar, or axillary lymphadenopathy by size criteria. The thyroid gland, trachea, and esophagus are within normal limits. Lungs/Pleura: There is a small right pleural effusion. Mild central bronchial wall thickening is noted bilaterally. Atelectasis is noted bilaterally. No pneumothorax. Upper Abdomen: No acute abnormality. Musculoskeletal: Anasarca is noted. Degenerative changes are present in the thoracic spine. No acute osseous abnormality. Review of the MIP images confirms the above findings. IMPRESSION: 1. No evidence of pulmonary embolism. 2. Mild central bronchial wall thickening bilaterally which may be infectious or inflammatory. 3. Small right pleural effusion. 4. Cardiomegaly with coronary artery calcifications. 5. Aortic atherosclerosis. 6. Anasarca. Electronically Signed   By: LBrett FairyM.D.   On: 09/22/2022 23:25   DG Chest 2 View  Result Date: 09/22/2022 CLINICAL DATA:  Shortness of breath, heart failure, extremity swelling. EXAM: CHEST - 2 VIEW COMPARISON:  08/13/2022. FINDINGS: Heart is enlarged and the mediastinal contour is within normal limits. The pulmonary vasculature is mildly distended. Subsegmental atelectasis is noted in the mid left lung. No effusion or pneumothorax. No acute osseous abnormality. IMPRESSION: Cardiomegaly with mild pulmonary vascular congestion. Electronically Signed   By: LBrett FairyM.D.   On: 09/22/2022 20:29     Procedures .Critical Care  Performed by: GSuzzette Righter PA-C Authorized by: GSuzzette Righter PA-C   Critical care provider statement:    Critical care time (minutes):  35   Critical care start time:  09/22/2022 8:00 PM   Critical care end time:  09/23/2022 12:02 AM   Critical care time was exclusive of:  Separately billable procedures and treating other patients and teaching time   Critical care was necessary to treat or prevent imminent or life-threatening deterioration of the following conditions:  Cardiac failure, circulatory failure, CNS failure or compromise, dehydration, endocrine crisis, metabolic crisis, renal failure, respiratory failure, sepsis and shock   Critical care was time spent personally by me on the following activities:  Ordering and performing treatments and interventions, ordering and review of laboratory studies, ordering and review of radiographic studies, pulse oximetry, development of treatment plan with patient or surrogate, discussions with consultants, evaluation of patient's response to treatment, re-evaluation of patient's condition, review of old charts, examination of patient and obtaining history from patient or surrogate   I assumed direction of critical care for this patient from another provider in my specialty: no     Care discussed with: admitting provider       Medications Ordered in ED Medications  furosemide (LASIX) injection 80 mg (has no administration in time range)  ondansetron (ZOFRAN) injection 4 mg (4 mg Intravenous Given 09/22/22 2054)  morphine (PF) 4 MG/ML injection 4 mg (4 mg Intravenous Given 09/22/22 2054)  potassium chloride SA (KLOR-CON M) CR tablet 40 mEq (40 mEq Oral Given 09/22/22 2320)  iohexol (OMNIPAQUE) 350 MG/ML injection 100 mL (100 mLs Intravenous Contrast Given 09/22/22 2306)    ED Course/  Medical Decision Making/ A&P                             Medical Decision Making Amount and/or Complexity of Data Reviewed Labs:  ordered. Decision-making details documented in ED Course. Radiology: ordered. Decision-making details documented in ED Course. ECG/medicine tests: ordered. Decision-making details documented in ED Course.  Risk Prescription drug management. Decision regarding hospitalization.   Medical Decision Making:   [name] is a 47 y.o. female who presented to the ED today with shortness of breath, lower extremity edema and, weight gain, chest pain detailed above.    External chart has been reviewed including previous ED visit and hospitalization. Patient's presentation is complicated by their history of combined systolic and diastolic heart failure, pulmonary embolus, intractable nausea and vomiting.  Patient placed on continuous vitals and telemetry monitoring while in ED which was reviewed periodically.  Complete initial physical exam performed, notably the patient was in mild respiratory distress, tachypneic, tachycardic, with decreased breath sounds, and with signs of edematous lower extremities and abdomen.  Otherwise, abdomen was nontender.  Patient was neurologically intact.    Reviewed and confirmed nursing documentation for past medical history, family history, social history.    HEART Score for Major Cardiac Events from [email or web address]  on 09/23/2022 ** All calculations should be rechecked by clinician prior to use **  RESULT SUMMARY: 6 points Moderate Score (4-6 points)  Risk of MACE of 12-16.6%.  If troponin is positive, many experts recommend further workup and admission even with a low HEART Score.   INPUTS: History --> 1 = Moderately suspicious EKG --> 1 = Non-specific repolarization disturbance Age --> 0 = <45 Risk factors --> 2 = ?3 risk factors or history of atherosclerotic disease Initial troponin --> 2 = >3 normal limit   Initial Assessment:   With the patient's presentation of shortness of breath, most likely diagnosis is acute heart failure exacerbation. Other  diagnoses were considered including (but not limited to)  Cardiac (pericardial effusion and tamponade, arrhythmias, ischemia, etc) Respiratory (COPD, asthma, pneumonia, pneumothorax, primary pulmonary hypertension, PE/VQ mismatch) Hematological (anemia) Neuromuscular (ALS, Guillain-Barr, etc) . These are considered less likely due to history of present illness and physical exam findings.   This is most consistent with an acute complicated illness  Initial Plan:  Screening labs including CBC and Metabolic panel to evaluate for infectious or metabolic etiology of disease.  BNP to evaluate for heart failure Troponin to evaluate for ACS INR to evaluate anticoagulation status Urinalysis with reflex culture ordered to evaluate for UTI or relevant urologic/nephrologic pathology.  CXR to evaluate for structural/infectious intrathoracic pathology.  EKG to evaluate for cardiac pathology CT chest to evaluate for PE or other intrathoracic pathology Objective evaluation as reviewed   Initial Study Results:   Laboratory  All laboratory results reviewed without evidence of clinically relevant pathology.   Exceptions include: Sodium 133, potassium 3.2, glucose 245, BNP 688, troponin 197 with repeat 170, PT 16, INR 1.3  EKG EKG was reviewed independently. Rate, rhythm, axis, intervals all examined and without acute medically relevant abnormality apart from sinus tachycardia. ST segments without concerns for acute elevations.    Radiology:  All images reviewed independently. Agree with radiology report at this time.   CT Angio Chest PE W and/or Wo Contrast  Result Date: 09/22/2022 CLINICAL DATA:  Pulmonary embolism suspected, high probability. Bilateral lower extremity edema and shortness of breath. EXAM: CT ANGIOGRAPHY CHEST WITH CONTRAST  TECHNIQUE: Multidetector CT imaging of the chest was performed using the standard protocol during bolus administration of intravenous contrast. Multiplanar CT image  reconstructions and MIPs were obtained to evaluate the vascular anatomy. RADIATION DOSE REDUCTION: This exam was performed according to the departmental dose-optimization program which includes automated exposure control, adjustment of the mA and/or kV according to patient size and/or use of iterative reconstruction technique. CONTRAST:  147m OMNIPAQUE IOHEXOL 350 MG/ML SOLN COMPARISON:  05/06/2022. FINDINGS: Cardiovascular: The heart is enlarged and there is no pericardial effusion. Scattered coronary artery calcifications are noted. There is atherosclerotic calcification of the aorta without evidence of aneurysm. The pulmonary trunk is normal in caliber. No pulmonary artery filling defect is seen. Mediastinum/Nodes: No mediastinal, hilar, or axillary lymphadenopathy by size criteria. The thyroid gland, trachea, and esophagus are within normal limits. Lungs/Pleura: There is a small right pleural effusion. Mild central bronchial wall thickening is noted bilaterally. Atelectasis is noted bilaterally. No pneumothorax. Upper Abdomen: No acute abnormality. Musculoskeletal: Anasarca is noted. Degenerative changes are present in the thoracic spine. No acute osseous abnormality. Review of the MIP images confirms the above findings. IMPRESSION: 1. No evidence of pulmonary embolism. 2. Mild central bronchial wall thickening bilaterally which may be infectious or inflammatory. 3. Small right pleural effusion. 4. Cardiomegaly with coronary artery calcifications. 5. Aortic atherosclerosis. 6. Anasarca. Electronically Signed   By: LBrett FairyM.D.   On: 09/22/2022 23:25   DG Chest 2 View  Result Date: 09/22/2022 CLINICAL DATA:  Shortness of breath, heart failure, extremity swelling. EXAM: CHEST - 2 VIEW COMPARISON:  08/13/2022. FINDINGS: Heart is enlarged and the mediastinal contour is within normal limits. The pulmonary vasculature is mildly distended. Subsegmental atelectasis is noted in the mid left lung. No effusion  or pneumothorax. No acute osseous abnormality. IMPRESSION: Cardiomegaly with mild pulmonary vascular congestion. Electronically Signed   By: LBrett FairyM.D.   On: 09/22/2022 20:29      Consults: Case discussed with hospitalist, Dr. GDeatra Jamespatient's elevated troponin and high heart score [facility], patient is a family medicine residency patient so we will need to admit to their service instead. Consult with family medicine service, Dr. KColletta Maryland who recommends cardiology be consulted and if they plan on doing intervention/following [facility] consulted and heart failure [facility] but does not believe pt's presentation consistent with ACS and likely all due to acute heart failure exacerbation with strain. [facility] presentation, and elevated troponin with multiple risk factors and high heart score [facility]   Final Assessment and Plan:   This is a 47year old female with a past medical history of combined systolic and diastolic heart failure, pulmonary embolism, poorly controlled hypertension, diabetes, ACS who presents to the ED with complaints of shortness of breath, chest tightness, weight gain, increased lower extremity edema, and other symptoms as above.  She reports that she has had symptoms intermittently chronically but they have been worse for the past 2 weeks.  She was evaluated by her PCP in the outpatient setting who doubled her Lasix dose to 80 mg twice daily but she has continued to get  worse.  On exam, patient is in mild respiratory distress with tachypnea, tachycardia,  and edematous lower extremities are tender to palpation.  Lower extremities do appear symmetrical without significant overlying skin changes.  Abdomen was nontender but was slightly distended.  Patient neurologically intact.  EKG without acute changes when compared to previous.  Patient appears clinically to be in acute heart failure exacerbation.  Workup reveals initial elevated troponin at 197 with repeat downtrending to 170, BNP of 698, chest x-ray with pulmonary vascular congestion, and CT scan with small right pleural effusion and anasarca.  On multiple reassessments, patient remains tachypneic and tachycardic but is maintaining oxygen saturation in the high 90s on room air.  Discussed case with attending MD who agrees with management including plan to give IV Lasix 80 mg and admit to hospital. Hospitalist consulted and would like pt transferred to [facility] with elevated troponin. Family medicine residency program consulted and would like cardiology consult prior to transfer. Cardiology consulted and reviewed findings and presentation and believe pt's symptoms consistent with heart failure exacerbation with low suspicion for ACS but will have heart failure team see pt in the morning and follow in inpatient setting.  They do not recommend catheterization or further intervention at this time.  Patient to be started on heparin due to subtherapeutic INR.  As hospitalist at [facility] will not admit, extensive discussion with family medicine residency team and pt will be transferred over to [facility] for further care.   Clinical Impression:  1. Acute on chronic combined systolic and diastolic congestive heart failure (HCC)   2. Elevated troponin   3. Hypokalemia   4. Intractable nausea and vomiting   5. Pleural effusion   6. Anasarca      Admit        Final Clinical Impression(s) / ED Diagnoses Final diagnoses:  Acute on chronic combined systolic and diastolic congestive heart failure (HCC)   Elevated troponin  Hypokalemia  Intractable nausea and vomiting  Pleural effusion  Anasarca    Rx / DC Orders ED Discharge Orders     None         Suzzette Righter, PA-C 09/23/22 0108    Blanchie Dessert, MD 09/26/22 [phone]

## 2022-09-22 NOTE — ED Triage Notes (Addendum)
States that she has had increased edema bilaterally in the lower extremities, and she also feels SOB. She is alert and oriented from home. Also states that he BP medications are not working. Patient refused to go to [facility], she states that she comes here once a month to get the fluid drained from her.

## 2022-09-23 ENCOUNTER — Other Ambulatory Visit (HOSPITAL_COMMUNITY): Payer: Self-pay

## 2022-09-23 DIAGNOSIS — I152 Hypertension secondary to endocrine disorders: Secondary | ICD-10-CM | POA: Diagnosis present

## 2022-09-23 DIAGNOSIS — G4700 Insomnia, unspecified: Secondary | ICD-10-CM | POA: Diagnosis not present

## 2022-09-23 DIAGNOSIS — N179 Acute kidney failure, unspecified: Secondary | ICD-10-CM | POA: Diagnosis present

## 2022-09-23 DIAGNOSIS — K5901 Slow transit constipation: Secondary | ICD-10-CM | POA: Diagnosis not present

## 2022-09-23 DIAGNOSIS — I110 Hypertensive heart disease with heart failure: Secondary | ICD-10-CM | POA: Diagnosis present

## 2022-09-23 DIAGNOSIS — I429 Cardiomyopathy, unspecified: Secondary | ICD-10-CM | POA: Diagnosis not present

## 2022-09-23 DIAGNOSIS — I63511 Cerebral infarction due to unspecified occlusion or stenosis of right middle cerebral artery: Secondary | ICD-10-CM | POA: Diagnosis not present

## 2022-09-23 DIAGNOSIS — R7989 Other specified abnormal findings of blood chemistry: Secondary | ICD-10-CM | POA: Diagnosis not present

## 2022-09-23 DIAGNOSIS — G8194 Hemiplegia, unspecified affecting left nondominant side: Secondary | ICD-10-CM | POA: Diagnosis present

## 2022-09-23 DIAGNOSIS — R531 Weakness: Secondary | ICD-10-CM | POA: Diagnosis present

## 2022-09-23 DIAGNOSIS — R414 Neurologic neglect syndrome: Secondary | ICD-10-CM | POA: Diagnosis present

## 2022-09-23 DIAGNOSIS — E876 Hypokalemia: Secondary | ICD-10-CM | POA: Diagnosis not present

## 2022-09-23 DIAGNOSIS — I255 Ischemic cardiomyopathy: Secondary | ICD-10-CM | POA: Diagnosis not present

## 2022-09-23 DIAGNOSIS — Z794 Long term (current) use of insulin: Secondary | ICD-10-CM | POA: Diagnosis not present

## 2022-09-23 DIAGNOSIS — I5043 Acute on chronic combined systolic (congestive) and diastolic (congestive) heart failure: Secondary | ICD-10-CM | POA: Diagnosis not present

## 2022-09-23 DIAGNOSIS — R739 Hyperglycemia, unspecified: Secondary | ICD-10-CM | POA: Diagnosis not present

## 2022-09-23 DIAGNOSIS — I700 Atherosclerosis of aorta: Secondary | ICD-10-CM | POA: Diagnosis present

## 2022-09-23 DIAGNOSIS — I619 Nontraumatic intracerebral hemorrhage, unspecified: Secondary | ICD-10-CM | POA: Diagnosis present

## 2022-09-23 DIAGNOSIS — G8929 Other chronic pain: Secondary | ICD-10-CM | POA: Diagnosis not present

## 2022-09-23 DIAGNOSIS — I272 Pulmonary hypertension, unspecified: Secondary | ICD-10-CM | POA: Diagnosis present

## 2022-09-23 DIAGNOSIS — Z6841 Body Mass Index (BMI) 40.0 and over, adult: Secondary | ICD-10-CM | POA: Diagnosis not present

## 2022-09-23 DIAGNOSIS — E1169 Type 2 diabetes mellitus with other specified complication: Secondary | ICD-10-CM | POA: Diagnosis not present

## 2022-09-23 DIAGNOSIS — I2489 Other forms of acute ischemic heart disease: Secondary | ICD-10-CM | POA: Diagnosis present

## 2022-09-23 DIAGNOSIS — R252 Cramp and spasm: Secondary | ICD-10-CM | POA: Diagnosis not present

## 2022-09-23 DIAGNOSIS — E1122 Type 2 diabetes mellitus with diabetic chronic kidney disease: Secondary | ICD-10-CM | POA: Diagnosis present

## 2022-09-23 DIAGNOSIS — M25562 Pain in left knee: Secondary | ICD-10-CM | POA: Diagnosis not present

## 2022-09-23 DIAGNOSIS — I509 Heart failure, unspecified: Secondary | ICD-10-CM | POA: Diagnosis not present

## 2022-09-23 DIAGNOSIS — R601 Generalized edema: Secondary | ICD-10-CM

## 2022-09-23 DIAGNOSIS — I63411 Cerebral infarction due to embolism of right middle cerebral artery: Secondary | ICD-10-CM | POA: Diagnosis not present

## 2022-09-23 DIAGNOSIS — I5021 Acute systolic (congestive) heart failure: Secondary | ICD-10-CM | POA: Diagnosis not present

## 2022-09-23 DIAGNOSIS — E669 Obesity, unspecified: Secondary | ICD-10-CM | POA: Diagnosis not present

## 2022-09-23 DIAGNOSIS — I513 Intracardiac thrombosis, not elsewhere classified: Secondary | ICD-10-CM | POA: Diagnosis not present

## 2022-09-23 DIAGNOSIS — M79605 Pain in left leg: Secondary | ICD-10-CM | POA: Diagnosis not present

## 2022-09-23 DIAGNOSIS — M549 Dorsalgia, unspecified: Secondary | ICD-10-CM | POA: Diagnosis not present

## 2022-09-23 DIAGNOSIS — E1143 Type 2 diabetes mellitus with diabetic autonomic (poly)neuropathy: Secondary | ICD-10-CM | POA: Diagnosis present

## 2022-09-23 DIAGNOSIS — E1165 Type 2 diabetes mellitus with hyperglycemia: Secondary | ICD-10-CM | POA: Diagnosis not present

## 2022-09-23 DIAGNOSIS — R630 Anorexia: Secondary | ICD-10-CM | POA: Diagnosis not present

## 2022-09-23 DIAGNOSIS — F32A Depression, unspecified: Secondary | ICD-10-CM | POA: Diagnosis present

## 2022-09-23 DIAGNOSIS — I252 Old myocardial infarction: Secondary | ICD-10-CM | POA: Diagnosis not present

## 2022-09-23 DIAGNOSIS — K5900 Constipation, unspecified: Secondary | ICD-10-CM | POA: Diagnosis not present

## 2022-09-23 DIAGNOSIS — I468 Cardiac arrest due to other underlying condition: Secondary | ICD-10-CM | POA: Diagnosis present

## 2022-09-23 DIAGNOSIS — J90 Pleural effusion, not elsewhere classified: Secondary | ICD-10-CM | POA: Diagnosis not present

## 2022-09-23 DIAGNOSIS — E1159 Type 2 diabetes mellitus with other circulatory complications: Secondary | ICD-10-CM | POA: Diagnosis not present

## 2022-09-23 DIAGNOSIS — I10 Essential (primary) hypertension: Secondary | ICD-10-CM | POA: Diagnosis not present

## 2022-09-23 DIAGNOSIS — I472 Ventricular tachycardia, unspecified: Secondary | ICD-10-CM | POA: Diagnosis not present

## 2022-09-23 DIAGNOSIS — I428 Other cardiomyopathies: Secondary | ICD-10-CM | POA: Diagnosis present

## 2022-09-23 DIAGNOSIS — I2510 Atherosclerotic heart disease of native coronary artery without angina pectoris: Secondary | ICD-10-CM | POA: Diagnosis not present

## 2022-09-23 DIAGNOSIS — F330 Major depressive disorder, recurrent, mild: Secondary | ICD-10-CM | POA: Diagnosis not present

## 2022-09-23 DIAGNOSIS — M79604 Pain in right leg: Secondary | ICD-10-CM | POA: Diagnosis not present

## 2022-09-23 DIAGNOSIS — F329 Major depressive disorder, single episode, unspecified: Secondary | ICD-10-CM | POA: Diagnosis not present

## 2022-09-23 DIAGNOSIS — J9601 Acute respiratory failure with hypoxia: Secondary | ICD-10-CM | POA: Diagnosis present

## 2022-09-23 DIAGNOSIS — I480 Paroxysmal atrial fibrillation: Secondary | ICD-10-CM | POA: Diagnosis present

## 2022-09-23 DIAGNOSIS — I5042 Chronic combined systolic (congestive) and diastolic (congestive) heart failure: Secondary | ICD-10-CM | POA: Diagnosis not present

## 2022-09-23 DIAGNOSIS — I6601 Occlusion and stenosis of right middle cerebral artery: Secondary | ICD-10-CM | POA: Diagnosis not present

## 2022-09-23 LAB — BASIC METABOLIC PANEL
Anion gap: 15 (ref 5–15)
BUN: 14 mg/dL (ref 6–20)
CO2: 22 mmol/L (ref 22–32)
Calcium: 8.1 mg/dL — ABNORMAL LOW (ref 8.9–10.3)
Chloride: 96 mmol/L — ABNORMAL LOW (ref 98–111)
Creatinine, Ser: 0.98 mg/dL (ref 0.44–1.00)
GFR, Estimated: >60 mL/min (ref >60)
Glucose, Bld: 267 mg/dL — ABNORMAL HIGH (ref 70–99)
Potassium: 3.6 mmol/L (ref 3.5–5.1)
Sodium: 133 mmol/L — ABNORMAL LOW (ref 135–145)

## 2022-09-23 LAB — URINALYSIS, ROUTINE W REFLEX MICROSCOPIC
Bacteria, UA: NONE SEEN
Bilirubin Urine: NEGATIVE
Glucose, UA: NEGATIVE mg/dL
Ketones, ur: NEGATIVE mg/dL
Nitrite: NEGATIVE
Protein, ur: 100 mg/dL — AB
Specific Gravity, Urine: 1.026 (ref 1.005–1.030)
pH: 5 (ref 5.0–8.0)

## 2022-09-23 LAB — CBC
HCT: 39.6 % (ref 36.0–46.0)
Hemoglobin: 12.4 g/dL (ref 12.0–15.0)
MCH: 22.3 pg — ABNORMAL LOW (ref 26.0–34.0)
MCHC: 31.3 g/dL (ref 30.0–36.0)
MCV: 71.4 fL — ABNORMAL LOW (ref 80.0–100.0)
Platelets: 228 10*3/uL (ref 150–400)
RBC: 5.55 MIL/uL — ABNORMAL HIGH (ref 3.87–5.11)
RDW: 24.8 % — ABNORMAL HIGH (ref 11.5–15.5)
WBC: 7.5 10*3/uL (ref 4.0–10.5)
nRBC: 0 % (ref 0.0–0.2)

## 2022-09-23 LAB — GLUCOSE, CAPILLARY
Glucose-Capillary: 295 mg/dL — ABNORMAL HIGH (ref 70–99)
Glucose-Capillary: 224 mg/dL — ABNORMAL HIGH (ref 70–99)
Glucose-Capillary: 132 mg/dL — ABNORMAL HIGH (ref 70–99)

## 2022-09-23 LAB — MAGNESIUM: Magnesium: 1.3 mg/dL — ABNORMAL LOW (ref 1.7–2.4)

## 2022-09-23 LAB — HEPARIN LEVEL (UNFRACTIONATED)
Heparin Unfractionated: 0.24 IU/mL — ABNORMAL LOW (ref 0.30–0.70)
Heparin Unfractionated: 0.51 IU/mL (ref 0.30–0.70)

## 2022-09-23 MED ORDER — METOPROLOL SUCCINATE ER 25 MG PO TB24
25.0000 mg | ORAL_TABLET | Freq: Every day | ORAL | Status: DC
  Administered 2022-09-23 – 2022-09-24 (×2): 25 mg via ORAL
  Filled 2022-09-23 (×2): qty 1

## 2022-09-23 MED ORDER — INSULIN GLARGINE-YFGN 100 UNIT/ML ~~LOC~~ SOLN
15.0000 [IU] | Freq: Every day | SUBCUTANEOUS | Status: DC
  Administered 2022-09-23 – 2022-09-24 (×2): 15 [IU] via SUBCUTANEOUS
  Filled 2022-09-23 (×2): qty 0.15

## 2022-09-23 MED ORDER — MORPHINE SULFATE (PF) 4 MG/ML IV SOLN
4.0000 mg | Freq: Once | INTRAVENOUS | Status: AC
  Administered 2022-09-23: 4 mg via INTRAVENOUS
  Filled 2022-09-23: qty 1

## 2022-09-23 MED ORDER — SPIRONOLACTONE 25 MG PO TABS
25.0000 mg | ORAL_TABLET | Freq: Every day | ORAL | Status: DC
  Administered 2022-09-23 – 2022-09-24 (×2): 25 mg via ORAL
  Filled 2022-09-23 (×2): qty 1

## 2022-09-23 MED ORDER — HEPARIN (PORCINE) 25000 UT/250ML-% IV SOLN
1050.0000 [IU]/h | INTRAVENOUS | Status: DC
  Administered 2022-09-23 (×2): 1050 [IU]/h via INTRAVENOUS
  Filled 2022-09-23: qty 250

## 2022-09-23 MED ORDER — PROCHLORPERAZINE EDISYLATE 10 MG/2ML IJ SOLN
5.0000 mg | Freq: Once | INTRAMUSCULAR | Status: AC
  Administered 2022-09-23: 5 mg via INTRAVENOUS
  Filled 2022-09-23: qty 2

## 2022-09-23 MED ORDER — SERTRALINE HCL 50 MG PO TABS
50.0000 mg | ORAL_TABLET | Freq: Every day | ORAL | Status: DC
  Administered 2022-09-23 – 2022-09-24 (×2): 50 mg via ORAL
  Filled 2022-09-23 (×2): qty 1

## 2022-09-23 MED ORDER — METOCLOPRAMIDE HCL 5 MG PO TABS
5.0000 mg | ORAL_TABLET | Freq: Three times a day (TID) | ORAL | Status: DC
  Administered 2022-09-23 – 2022-09-24 (×4): 5 mg via ORAL
  Filled 2022-09-23 (×6): qty 1

## 2022-09-23 MED ORDER — OXYCODONE HCL 5 MG PO TABS
2.5000 mg | ORAL_TABLET | Freq: Once | ORAL | Status: AC
  Administered 2022-09-23: 2.5 mg via ORAL
  Filled 2022-09-23: qty 1

## 2022-09-23 MED ORDER — INSULIN ASPART 100 UNIT/ML IJ SOLN
0.0000 [IU] | Freq: Three times a day (TID) | INTRAMUSCULAR | Status: DC
  Administered 2022-09-23: 8 [IU] via SUBCUTANEOUS
  Administered 2022-09-23: 5 [IU] via SUBCUTANEOUS
  Administered 2022-09-24: 2 [IU] via SUBCUTANEOUS
  Administered 2022-09-24: 3 [IU] via SUBCUTANEOUS
  Administered 2022-09-24 – 2022-09-25 (×3): 2 [IU] via SUBCUTANEOUS
  Administered 2022-09-26 (×2): 3 [IU] via SUBCUTANEOUS

## 2022-09-23 MED ORDER — ACETAMINOPHEN 325 MG PO TABS
650.0000 mg | ORAL_TABLET | Freq: Four times a day (QID) | ORAL | Status: DC | PRN
  Administered 2022-09-24: 650 mg via ORAL
  Filled 2022-09-23: qty 2

## 2022-09-23 MED ORDER — TRAZODONE HCL 50 MG PO TABS
50.0000 mg | ORAL_TABLET | Freq: Every day | ORAL | Status: DC
  Administered 2022-09-23: 50 mg via ORAL
  Filled 2022-09-23: qty 1

## 2022-09-23 MED ORDER — MAGNESIUM SULFATE 2 GM/50ML IV SOLN
2.0000 g | Freq: Once | INTRAVENOUS | Status: AC
  Administered 2022-09-23: 2 g via INTRAVENOUS
  Filled 2022-09-23: qty 50

## 2022-09-23 MED ORDER — HEPARIN BOLUS VIA INFUSION
1000.0000 [IU] | Freq: Once | INTRAVENOUS | Status: AC
  Administered 2022-09-23: 1000 [IU] via INTRAVENOUS
  Filled 2022-09-23: qty 1000

## 2022-09-23 MED ORDER — FUROSEMIDE 10 MG/ML IJ SOLN
80.0000 mg | Freq: Two times a day (BID) | INTRAMUSCULAR | Status: DC
  Administered 2022-09-23 – 2022-09-24 (×2): 80 mg via INTRAVENOUS
  Filled 2022-09-23 (×2): qty 8

## 2022-09-23 MED ORDER — HEPARIN BOLUS VIA INFUSION
3000.0000 [IU] | Freq: Once | INTRAVENOUS | Status: AC
  Administered 2022-09-23: 3000 [IU] via INTRAVENOUS
  Filled 2022-09-23: qty 3000

## 2022-09-23 MED ORDER — LOSARTAN POTASSIUM 50 MG PO TABS
50.0000 mg | ORAL_TABLET | Freq: Every day | ORAL | Status: DC
  Administered 2022-09-23 – 2022-09-24 (×2): 50 mg via ORAL
  Filled 2022-09-23 (×2): qty 1

## 2022-09-23 MED ORDER — ATORVASTATIN CALCIUM 80 MG PO TABS
80.0000 mg | ORAL_TABLET | Freq: Every day | ORAL | Status: DC
  Administered 2022-09-23: 80 mg via ORAL
  Filled 2022-09-23: qty 1

## 2022-09-23 MED ORDER — HEPARIN (PORCINE) 25000 UT/250ML-% IV SOLN
900.0000 [IU]/h | INTRAVENOUS | Status: DC
  Administered 2022-09-23: 900 [IU]/h via INTRAVENOUS
  Filled 2022-09-23: qty 250

## 2022-09-23 MED ORDER — POTASSIUM CHLORIDE CRYS ER 20 MEQ PO TBCR
40.0000 meq | EXTENDED_RELEASE_TABLET | Freq: Once | ORAL | Status: AC
  Administered 2022-09-23: 40 meq via ORAL
  Filled 2022-09-23: qty 2

## 2022-09-23 MED ORDER — FUROSEMIDE 10 MG/ML IJ SOLN
80.0000 mg | Freq: Once | INTRAMUSCULAR | Status: AC
  Administered 2022-09-23: 80 mg via INTRAVENOUS
  Filled 2022-09-23: qty 8

## 2022-09-23 MED ORDER — EMPAGLIFLOZIN 10 MG PO TABS
10.0000 mg | ORAL_TABLET | Freq: Every day | ORAL | Status: DC
  Administered 2022-09-23 – 2022-09-24 (×2): 10 mg via ORAL
  Filled 2022-09-23 (×2): qty 1

## 2022-09-23 NOTE — Assessment & Plan Note (Addendum)
K 3.3 today. S/p 52mq PO repletion. Mag 1.6 today s/p 2g. -check BMP, Mg AM -replete as appropriate. Goal K>4, Mg>2

## 2022-09-23 NOTE — ED Notes (Signed)
ED TO INPATIENT HANDOFF REPORT  Name/Age/Gender [name] 47 y.o. female  Code Status Code Status History     Date Active Date Inactive Code Status Order ID Comments User Context   08/05/2022 1658 08/09/2022 2146 Full Code [id number]  Ronika, Teffeteller, DO ED   06/25/2022 1447 07/01/2022 1507 Full Code [id number]  Reubin Milan, MD ED   06/02/2022 2111 06/06/2022 1549 Full Code [id number]  Clance Boll, MD ED   02/22/2022 2116 02/28/2022 1756 Full Code [id number]  Howerter, Ethelda Chick, DO ED   01/29/2022 0631 01/29/2022 1521 Full Code [id number]  Sharion Settler, DO ED   11/02/2021 0140 11/08/2021 0005 Full Code [id number]  Gladys Damme, MD ED   10/17/2021 2256 10/19/2021 2335 Full Code [id number]  Orvis Brill, DO ED   10/17/2021 2224 10/17/2021 2256 Full Code [id number]  Gifford Shave, MD ED   09/20/2021 2137 09/22/2021 1936 Full Code [id number]  Rise Patience, DO Inpatient   09/18/2021 0017 09/20/2021 2137 DNR [id number]  Precious Gilding, DO ED   09/04/2020 2017 09/05/2020 1607 Full Code [id number]  Elwyn Reach, MD ED   09/30/2019 0349 10/04/2019 2151 Full Code [id number]  Etta Quill, DO ED   03/28/2019 1301 04/02/2019 2016 Full Code [id number]  Gladys Damme, MD Inpatient   03/27/2019 1606 03/28/2019 1301 DNR [id number]  Kathrene Alu, MD ED   03/27/2019 1541 03/27/2019 1606 Full Code [id number]  Kathrene Alu, MD ED   02/20/2019 0029 02/25/2019 1839 Full Code [id number]  Lenore Cordia, MD ED   02/06/2019 2114 02/09/2019 1359 Full Code [id number]  Richarda Osmond, DO Inpatient   07/22/2013 1240 07/24/2013 1702 Full Code [id number]  Gerda Diss, DO Inpatient    Questions for Most Recent Historical Code Status (Order [id number])     Question Answer   By: Consent: discussion documented in EHR            Home/SNF/Other Home  Chief Complaint Heart failure ([place]) [I50.9]  Level of Care/Admitting Diagnosis ED Disposition     ED Disposition  Admit   Condition  --   [place]: [facility] [100100]  Level of Care: Telemetry Medical [104]  May admit patient to [facility] or [facility] if equivalent level of care is available:: No  Covid Evaluation: Asymptomatic - no recent exposure (last 10 days) testing not required  Diagnosis: Heart failure [facility]) [id number]  Admitting Physician: Lind Covert [[id number]]  Attending Physician: Lind Covert [id number]  Certification:: I certify this patient will need inpatient services for at least 2 midnights  Estimated Length of Stay: 3          Medical History Past Medical History:  Diagnosis Date   Abnormal Pap smear    Acute combined systolic and diastolic heart failure ([place]) 02/19/2019   Wt Readings from Last 3 Encounters:  09/18/21  84.7 kg  09/02/21  84.4 kg  08/31/21  84.2 kg         Acute heart failure ([place]) 01/29/2022   Acute on chronic combined systolic and diastolic CHF (congestive heart failure) ([place]) 02/19/2019   Acute on chronic congestive heart failure ([place])    Acute on chronic congestive heart failure ([place])    Acute on chronic diastolic CHF (congestive heart failure) ([place]) 10/19/2021   Acute on chronic systolic heart failure ([place]) 02/22/2022   Anemia    Asthma    Atrial fibrillation (HCC)    history of paroxysmal atrial fibrillation  CHF (congestive heart failure) ([place]) 11/02/2021   CHF exacerbation ([place]) 10/17/2021   Diabetes mellitus    DKA, type 2 ([place]) 02/06/2019   Elevated troponin 09/30/2019   Heart failure with reduced ejection fraction ([place])    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      Migraines    Morbid obesity ([place]) 02/06/2019   Nausea & vomiting 06/26/2022   Nonischemic cardiomyopathy ([place]) 09/22/2021   NSTEMI (non-ST elevated myocardial infarction) ([place]) 02/23/2022   Obstructive sleep apnea 06/30/2015   Polycystic ovarian disease     Allergies Allergies  Allergen Reactions   Fish Allergy Swelling   Shellfish Allergy Anaphylaxis, Swelling and Other (See Comments)    Airway involvement including EMS - Has Epi-Pen   Metformin And Related Diarrhea   Trulicity [Dulaglutide] Nausea And Vomiting and Other (See Comments)    Multiple episodes of vomiting over multiple days   Nsaids Other (See Comments)    Per PCP interferes with daily meds (heart failure)   Advil [Ibuprofen] Other (See Comments)    Per PCP interferes with daily meds (heart failure)    IV Location/Drains/Wounds Patient Lines/Drains/Airways Status     Active Line/Drains/Airways     Name Placement date Placement time Site Days   Peripheral IV 09/22/22 20 G Left Antecubital 09/22/22  2053  Antecubital  1            Labs/Imaging Results for orders placed or performed during the hospital encounter of 09/22/22 (from the past 48 hour(s))  Basic metabolic panel     Status: Abnormal   Collection Time: 09/22/22  8:50 PM  Result Value Ref Range   Sodium 133 (L) 135 - 145 mmol/L   Potassium 3.2 (L) 3.5 - 5.1 mmol/L   Chloride 95 (L) 98 - 111 mmol/L   CO2 26 22 - 32 mmol/L   Glucose, Bld 245 (H) 70 - 99 mg/dL    Comment: Glucose reference range applies only to samples taken after fasting for at least 8 hours.   BUN 15 6 - 20 mg/dL   Creatinine, Ser 1.01 (H) 0.44 - 1.00 mg/dL   Calcium 8.1 (L) 8.9 - 10.3 mg/dL   GFR, Estimated >60 >60 mL/min    Comment: (NOTE) Calculated using the CKD-EPI Creatinine Equation (2021)    Anion gap 12 5 - 15    Comment: Performed at [facility], [place] [address] [place], [place] [address]  Brain natriuretic peptide     Status: Abnormal    Collection Time: 09/22/22  8:50 PM  Result Value Ref Range   B Natriuretic Peptide 688.8 (H) 0.0 - 100.0 pg/mL    Comment: Performed at [facility], [place] [address], [place], [place] [address]  CBC with Differential     Status: Abnormal   Collection Time: 09/22/22  8:50 PM  Result Value Ref Range   WBC 8.7 4.0 - 10.5 K/uL   RBC 5.81 (H) 3.87 - 5.11 MIL/uL   Hemoglobin 13.0 12.0 - 15.0 g/dL   HCT 41.3 36.0 - 46.0 %   MCV 71.1 (L) 80.0 - 100.0 fL   MCH  22.4 (L) 26.0 - 34.0 pg   MCHC 31.5 30.0 - 36.0 g/dL   RDW 24.5 (H) 11.5 - 15.5 %   Platelets 232 150 - 400 K/uL   nRBC 0.0 0.0 - 0.2 %   Neutrophils Relative % 69 %   Neutro Abs 6.0 1.7 - 7.7 K/uL   Lymphocytes Relative 24 %   Lymphs Abs 2.1 0.7 - 4.0 K/uL   Monocytes Relative 6 %   Monocytes Absolute 0.5 0.1 - 1.0 K/uL   Eosinophils Relative 0 %   Eosinophils Absolute 0.0 0.0 - 0.5 K/uL   Basophils Relative 1 %   Basophils Absolute 0.0 0.0 - 0.1 K/uL   Immature Granulocytes 0 %   Abs Immature Granulocytes 0.02 0.00 - 0.07 K/uL   Target Cells PRESENT     Comment: Performed at [facility], [place] [address], [place], [place] [address]  Troponin I (High Sensitivity)     Status: Abnormal   Collection Time: 09/22/22  8:50 PM  Result Value Ref Range   Troponin I (High Sensitivity) 197 (HH) <18 ng/L    Comment: CRITICAL RESULT CALLED TO, READ BACK BY AND VERIFIED WITH KELSEY,A RN AT 2136 ON 09/22/22 BY [email or web address] (NOTE) Elevated high sensitivity troponin I (hsTnI) values and significant  changes across serial measurements may suggest ACS but many other  chronic and acute conditions are known to elevate hsTnI results.  Refer to the "Links" section for chest pain algorithms and additional  guidance. Performed at [facility], [place] [address] [place], [place] [address]   I-Stat Creatinine, ED (not at [facility] or [facility])     Status: None   Collection Time: 09/22/22  9:03 PM  Result Value  Ref Range   Creatinine, Ser 1.00 0.44 - 1.00 mg/dL  Blood gas, venous (at [facility] and [facility])     Status: Abnormal   Collection Time: 09/22/22  9:28 PM  Result Value Ref Range   pH, Ven 7.5 (H) 7.25 - 7.43   pCO2, Ven 36 (L) 44 - 60 mmHg   pO2, Ven 50 (H) 32 - 45 mmHg   Bicarbonate 28.1 (H) 20.0 - 28.0 mmol/L   Acid-Base Excess 4.8 (H) 0.0 - 2.0 mmol/L   O2 Saturation 80.3 %   Patient temperature 37.0     Comment: Performed at [facility], [place] [address], [place], [place] [address]  Protime-INR     Status: Abnormal   Collection Time: 09/22/22  9:34 PM  Result Value Ref Range   Prothrombin Time 16.0 (H) 11.4 - 15.2 seconds   INR 1.3 (H) 0.8 - 1.2    Comment: (NOTE) INR goal varies based on device and disease states. Performed at [facility], [place] [address] [place], [place] [address]   Troponin I (High Sensitivity)     Status: Abnormal   Collection Time: 09/22/22  9:34 PM  Result Value Ref Range   Troponin I (High Sensitivity) 170 (HH) <18 ng/L    Comment: CRITICAL VALUE NOTED. VALUE IS CONSISTENT WITH PREVIOUSLY REPORTED/CALLED VALUE DELTA CHECK NOTED (NOTE) Elevated high sensitivity troponin I (hsTnI) values and significant  changes across serial measurements may suggest ACS but many other  chronic and acute conditions are known to elevate hsTnI results.  Refer to the "Links" section for chest pain algorithms and additional  guidance. Performed at [facility], [place] [address], [place], [place] [address]   Urinalysis, Routine w reflex microscopic -Urine, Clean Catch  Status: Abnormal   Collection Time: 09/23/22 12:06 AM  Result Value Ref Range   Color, Urine YELLOW YELLOW   APPearance CLEAR CLEAR   Specific Gravity, Urine 1.026 1.005 - 1.030   pH 5.0 5.0 - 8.0   Glucose, UA NEGATIVE NEGATIVE mg/dL   Hgb urine dipstick SMALL (A) NEGATIVE   Bilirubin Urine NEGATIVE NEGATIVE   Ketones, ur NEGATIVE NEGATIVE mg/dL    Protein, ur 100 (A) NEGATIVE mg/dL   Nitrite NEGATIVE NEGATIVE   Leukocytes,Ua TRACE (A) NEGATIVE   RBC / HPF 0-5 0 - 5 RBC/hpf   WBC, UA 0-5 0 - 5 WBC/hpf   Bacteria, UA NONE SEEN NONE SEEN   Squamous Epithelial / HPF 0-5 0 - 5 /HPF   Hyaline Casts, UA PRESENT     Comment: Performed at [facility], [place] [address] [place], [place] [address]  Heparin level (unfractionated)     Status: Abnormal   Collection Time: 09/23/22  5:14 AM  Result Value Ref Range   Heparin Unfractionated 0.24 (L) 0.30 - 0.70 IU/mL    Comment: (NOTE) The clinical reportable range upper limit is being lowered to >1.10 to align with the FDA approved guidance for the current laboratory assay.  If heparin results are below expected values, and patient dosage has  been confirmed, suggest follow up testing of antithrombin III levels. Performed at [facility], [place] [address] [place], [place] [address]   CBC     Status: Abnormal   Collection Time: 09/23/22  5:14 AM  Result Value Ref Range   WBC 7.5 4.0 - 10.5 K/uL   RBC 5.55 (H) 3.87 - 5.11 MIL/uL   Hemoglobin 12.4 12.0 - 15.0 g/dL   HCT 39.6 36.0 - 46.0 %   MCV 71.4 (L) 80.0 - 100.0 fL   MCH 22.3 (L) 26.0 - 34.0 pg   MCHC 31.3 30.0 - 36.0 g/dL   RDW 24.8 (H) 11.5 - 15.5 %   Platelets 228 150 - 400 K/uL   nRBC 0.0 0.0 - 0.2 %    Comment: Performed at [facility], [place] [address], [place], [place] [address]   CT Angio Chest PE W and/or Wo Contrast  Result Date: 09/22/2022 CLINICAL DATA:  Pulmonary embolism suspected, high probability. Bilateral lower extremity edema and shortness of breath. EXAM: CT ANGIOGRAPHY CHEST WITH CONTRAST TECHNIQUE: Multidetector CT imaging of the chest was performed using the standard protocol during bolus administration of intravenous contrast. Multiplanar CT image reconstructions and MIPs were obtained to evaluate the vascular anatomy. RADIATION DOSE REDUCTION: This exam was  performed according to the departmental dose-optimization program which includes automated exposure control, adjustment of the mA and/or kV according to patient size and/or use of iterative reconstruction technique. CONTRAST:  161m OMNIPAQUE IOHEXOL 350 MG/ML SOLN COMPARISON:  05/06/2022. FINDINGS: Cardiovascular: The heart is enlarged and there is no pericardial effusion. Scattered coronary artery calcifications are noted. There is atherosclerotic calcification of the aorta without evidence of aneurysm. The pulmonary trunk is normal in caliber. No pulmonary artery filling defect is seen. Mediastinum/Nodes: No mediastinal, hilar, or axillary lymphadenopathy by size criteria. The thyroid gland, trachea, and esophagus are within normal limits. Lungs/Pleura: There is a small right pleural effusion. Mild central bronchial wall thickening is noted bilaterally. Atelectasis is noted bilaterally. No pneumothorax. Upper Abdomen: No acute abnormality. Musculoskeletal: Anasarca is noted. Degenerative changes are present in the thoracic spine. No acute osseous abnormality. Review of the MIP images confirms the above findings.  IMPRESSION: 1. No evidence of pulmonary embolism. 2. Mild central bronchial wall thickening bilaterally which may be infectious or inflammatory. 3. Small right pleural effusion. 4. Cardiomegaly with coronary artery calcifications. 5. Aortic atherosclerosis. 6. Anasarca. Electronically Signed   By: Brett Fairy M.D.   On: 09/22/2022 23:25   DG Chest 2 View  Result Date: 09/22/2022 CLINICAL DATA:  Shortness of breath, heart failure, extremity swelling. EXAM: CHEST - 2 VIEW COMPARISON:  08/13/2022. FINDINGS: Heart is enlarged and the mediastinal contour is within normal limits. The pulmonary vasculature is mildly distended. Subsegmental atelectasis is noted in the mid left lung. No effusion or pneumothorax. No acute osseous abnormality. IMPRESSION: Cardiomegaly with mild pulmonary vascular congestion.  Electronically Signed   By: Brett Fairy M.D.   On: 09/22/2022 20:29    Pending Labs Unresulted Labs (From admission, onward)     Start     Ordered   09/23/22 1500  Heparin level (unfractionated)  Once-Timed,   TIMED        09/23/22 0810   09/23/22 0800  CBC  Daily,   R      09/23/22 0427            Vitals/Pain Today's Vitals   09/23/22 0653 09/23/22 0915 09/23/22 0945 09/23/22 0948  BP:  (!) 141/111 (!) 134/108   Pulse:  (!) 111 (!) 110   Resp:  15 19   Temp:    97.6 F (36.4 C)  TempSrc:    Oral  SpO2:  94% 99%   Weight:      Height:      PainSc: 7        Isolation Precautions No active isolations  Medications Medications  heparin ADULT infusion 100 units/mL (25000 units/221m) (1,050 Units/hr Intravenous New Bag/Given 09/23/22 0850)  ondansetron (ZOFRAN) injection 4 mg (4 mg Intravenous Given 09/22/22 2054)  morphine (PF) 4 MG/ML injection 4 mg (4 mg Intravenous Given 09/22/22 2054)  potassium chloride SA (KLOR-CON M) CR tablet 40 mEq (40 mEq Oral Given 09/22/22 2320)  iohexol (OMNIPAQUE) 350 MG/ML injection 100 mL (100 mLs Intravenous Contrast Given 09/22/22 2306)  furosemide (LASIX) injection 80 mg (80 mg Intravenous Given 09/23/22 0028)  heparin bolus via infusion 3,000 Units (3,000 Units Intravenous Bolus from Bag 09/23/22 0153)  morphine (PF) 4 MG/ML injection 4 mg (4 mg Intravenous Given 09/23/22 0145)  heparin bolus via infusion 1,000 Units (1,000 Units Intravenous Bolus from Bag 09/23/22 0849)    Mobility walks with person assist

## 2022-09-23 NOTE — Progress Notes (Signed)
ANTICOAGULATION CONSULT NOTE - Follow Up Consult  Pharmacy Consult for Heparin Indication: chest pain/ACS  Allergies  Allergen Reactions   Fish Allergy Swelling   Shellfish Allergy Anaphylaxis, Swelling and Other (See Comments)    Airway involvement including EMS - Has Epi-Pen   Metformin And Related Diarrhea   Trulicity [Dulaglutide] Nausea And Vomiting and Other (See Comments)    Multiple episodes of vomiting over multiple days   Nsaids Other (See Comments)    Per PCP interferes with daily meds (heart failure)   Advil [Ibuprofen] Other (See Comments)    Per PCP interferes with daily meds (heart failure)    Patient Measurements: Height: '4\' 11"'$  (149.9 cm) Weight: 87.9 kg (193 lb 12.6 oz) IBW/kg (Calculated) : 43.2 Heparin Dosing Weight: 65 kg  Vital Signs: Temp: 98.6 F (37 C) (03/01 1618) Temp Source: Oral (03/01 1618) BP: 135/105 (03/01 1621) Pulse Rate: 112 (03/01 1621)  Labs: Recent Labs    09/22/22 2050 09/22/22 2103 09/22/22 2134 09/23/22 0514 09/23/22 1218 09/23/22 1455  HGB 13.0  --   --  12.4  --   --   HCT 41.3  --   --  39.6  --   --   PLT 232  --   --  228  --   --   LABPROT  --   --  16.0*  --   --   --   INR  --   --  1.3*  --   --   --   HEPARINUNFRC  --   --   --  0.24*  --  0.51  CREATININE 1.01* 1.00  --   --  0.98  --   TROPONINIHS 197*  --  170*  --   --   --      Estimated Creatinine Clearance: 70.7 mL/min (by C-G formula based on SCr of 0.98 mg/dL).   Medications:  Infusions:   heparin 1,050 Units/hr (09/23/22 0850)    Assessment: 76 yoF presents with ShOB, increased BLE swelling, chest pain, and intractable N/V (unable to take PO meds).  Differential includes ACS v. HF exacerbation.  PMH significant for CAD,  HF with EF<20%, Afib and hx PE on warfarin PTA (last dose dose 2/29).  Pharmacy consulted to dose IV heparin.  -heparin level= 0.51 on 1050 units/hr   Goal of Therapy:  INR 2-3 Heparin level 0.3-0.7 units/ml Monitor  platelets by anticoagulation protocol: Yes   Plan:  -Continue heparin at 1050 units/hr -Heparin level and CBC in am  Hildred Laser, PharmD Clinical Pharmacist **Pharmacist phone directory can now be found on [email or web address] ([email or web address]).  Listed under [place].

## 2022-09-23 NOTE — H&P (Addendum)
Hospital Admission History and Physical Service Pager: [phone]  Patient name: [name] Medical record number: [id number] Date of Birth: [birth year] Age: 47 y.o. Gender: female  Primary Care Provider: Donney Dice, DO Consultants: Cardiology Code Status: Full Preferred Emergency Contact:  Contact Information     Name Relation Home Work Mobile   [place] Daughter   [phone]   Jolyne Loa   [phone]        Chief Complaint: shortness of breath  Assessment and Plan: [name] is a 47 y.o. female presenting with shortness of breath secondary to acute combined CHF exacerbation. Differential for this patient's presentation of this also includes CAP (no leukocytosis, no evidence on CXR), PE (not seen on CTA), ACS (no EKG changes, troponin flat), anxiety, all of which are unlikely given evidence of significant volume overload on exam.  * Acute on chronic combined systolic (congestive) and diastolic (congestive) heart failure (HCC) Last echo 02/2022 with EF <20% and grade III diastolic dysfunction. BNP 688.8 and CXR with pulmonary vascular congestion. Appears significantly volume overloaded on exam. Current weight 193, dry weight ~180lbs. H/o subobtimal medication compliance, which is likely cause. -Admit to FMTS, med-tele, attending Dr Gwendlyn Deutscher -Cardiology consulted, appreciate recommendations -Lasix '80mg'$  IV x1 now -Continue home spironolactone '25mg'$  daily and Jardiance '10mg'$  daily -hold home metoprolol for now given unclear compliance  -strict I/Os, daily weights -daily BMP, Mg -f/u echo  Elevated troponin Trop elevated to 197 but trended down to 170. No EKG changes. Likely demand ischemia secondary to CHF exacerbation. -Cardiology consulted, appreciate recommendations -Continue heparin gtt for now, anticipate this will be discontinued pending cards recs  Poorly controlled type 2 diabetes mellitus ([place]) Last A1c >15.5 (06/02/22) -Check updated  A1c -CBG monitoring qAC and qHS -Semglee 15u daily -mSSI  Paroxysmal atrial fibrillation (HCC) In sinus rhythm currently, but would not be surprised if patient goes into a-fib RVR at some point due to significant volume overload. -tele monitoring -continue heparin gtt for now, anticipate discontinuing this pending cards recs. If heparin discontinued, resume home warfarin per pharmacy -holding home metoprolol during acute CHF exacerbation as I doubt good compliance at home  Chronic nausea Likely secondary to gastroparesis although this has not been formally diagnosed. Borderline prolonged QT on EKG. -Compazine '5mg'$  IV x1 now -Continue home Reglan '5mg'$  qAC and qHS -Daily EKG  Hypokalemia K 3.2 on admission. S/p 73mq PO repletion. -check BMP, Mg -replete as appropriate. Goal K>4, Mg>2  Hypertension associated with diabetes (HCC) BP elevated on admission -Continue home losartan '50mg'$  daily   Chronic and stable conditions: HLD- continue atorvastatin '80mg'$  daily OSA- CPAP nightly Depression/anxiety- continue home zoloft '50mg'$  daily and trazodone '50mg'$  qhs   FEN/GI: heart healthy diet VTE Prophylaxis: on heparin gtt  Disposition: med-tele  History of Present Illness:  Tammy HERMANCEis a 47y.o. female presenting with shortness of breath. Patient reports she's been getting progressively more swollen for the past several weeks.  Saw cardiology outpatient on 2/15 and appeared volume overloaded so her diuretic dose was adjusted from torsemide 40 mg daily to 40 mg BID.  Despite this, had no improvement in her swelling.  She notices it in her bilateral lower extremities and in her abdomen.  Over the past few days started becoming short of breath, unable to go up her stairs due to shortness of breath and leg heaviness.  Reports her baseline weight is around 180 but she is currently 200 pounds.  Endorses compliance with her medications although  states she does not think she has been able to keep  them down due to her chronic nausea and vomiting.  She does endorse some chest pain for the past few weeks as well.  Finds it difficult to describe and difficult to differentiate her chest pain from her shortness of breath.  [facility], patient hypertensive and tachycardic.  Notable labs include elevated BNP in the 600s and elevated troponin to 197.  She was started on a heparin drip. CXR with mild pulmonary vascular congestion and CTA negative for PE. Treated with IV Lasix '80mg'$  x1.  [facility] hospitalist would not admit due to elevated troponin therefore patient transferred to [facility].  Review Of Systems: Per HPI above  Pertinent Past Medical History: Paroxysmal A-fib-on Warfarin CAD-history NSTEMI T2DM  HTN Asthma History LV thrombus Depression and anxiety  Remainder reviewed in history tab.   Pertinent Past Surgical History: EGD 10/2021 Right and left heart cath and coronary angiography in 2020 and 2023 Remainder reviewed in history tab.   Pertinent Social History: Tobacco use: No Alcohol use: No Other Substance use: No  Pertinent Family History: Mother: DM, HTN Father: DM, HTN Remainder reviewed in history tab.   Important Outpatient Medications: Lipitor '80mg'$  daily Jardiance '10mg'$  daily Novolog 12U before dinner Lantus 15U daily Losartan '50mg'$  daily Warfarin (Take 1 tablet (7.5 mg) tablet by mouth every Monday & Take 1.5 tablets (11.25 mg) all other days) Reglan '5mg'$  QID with meals and qhs Metoprolol '25mg'$  daily Zoloft '50mg'$  daily Spironolactone '25mg'$  daily Torsemide '40mg'$  BID Trazadone '50mg'$  daily Remainder reviewed in medication history.   Objective: BP (!) 134/108   Pulse (!) 110   Temp 97.6 F (36.4 C) (Oral)   Resp 19   Ht '4\' 11"'$  (1.499 m)   Wt 87.9 kg   LMP 06/22/2022 Comment: patient states no chance of pregnancy  SpO2 99%   BMI 39.14 kg/m  Exam: General: alert, NAD Eyes: normal sclera and conjunctiva ENTM: moist mucous membranes Neck: JVD  noted to level of jaw Cardiovascular: regular rhythm, tachycardic Respiratory: normal effort on room air, bibasilar crackles noted Gastrointestinal: Abdomen appears edematous, mild generalized tenderness to palpation Ext: 2-3+ pitting edema of bilateral lower extremities up to the hip Neuro: A&O x 4, no obvious focal deficits Psych: Appropriate mood and affect  Labs:  CBC BMET  Recent Labs  Lab 09/23/22 0514  WBC 7.5  HGB 12.4  HCT 39.6  PLT 228   Recent Labs  Lab 09/22/22 2050 09/22/22 2103  NA 133*  --   K 3.2*  --   CL 95*  --   CO2 26  --   BUN 15  --   CREATININE 1.01* 1.00  GLUCOSE 245*  --   CALCIUM 8.1*  --     Pertinent additional labs: Troponin: 197>170 BNP 688.8 VBG: 7.5, 36, 50, 4.8, 28.1, 80 PT: 16 INR: 1.3 UA: Small hgb, trace leukocytes, 100 protein  EKG: Sinus tachycardia at 106bpm, borderline LAD, poor R wave progression, borderline prolonged QT   Imaging Studies Performed: CT Angio Chest PE W and/or Wo Contrast Result Date: 09/22/2022 IMPRESSION: 1. No evidence of pulmonary embolism. 2. Mild central bronchial wall thickening bilaterally which may be infectious or inflammatory. 3. Small right pleural effusion. 4. Cardiomegaly with coronary artery calcifications. 5. Aortic atherosclerosis. 6. Anasarca.  DG Chest 2 View Result Date: 09/22/2022 IMPRESSION: Cardiomegaly with mild pulmonary vascular congestion.    Alcus Dad, MD 09/23/2022, 12:17 PM PGY-3, [place]  [place] Intern pager: [phone], text pages welcome Secure chat group [place]

## 2022-09-23 NOTE — Progress Notes (Signed)
ANTICOAGULATION CONSULT NOTE - Initial Consult  Pharmacy Consult for Heparin Indication: chest pain/ACS  Allergies  Allergen Reactions   Fish Allergy Swelling   Shellfish Allergy Anaphylaxis, Swelling and Other (See Comments)    Airway involvement including EMS - Has Epi-Pen   Metformin And Related Diarrhea   Trulicity [Dulaglutide] Nausea And Vomiting and Other (See Comments)    Multiple episodes of vomiting over multiple days   Nsaids Other (See Comments)    Per PCP interferes with daily meds (heart failure)   Advil [Ibuprofen] Other (See Comments)    Per PCP interferes with daily meds (heart failure)    Patient Measurements: Height: '4\' 11"'$  (149.9 cm) Weight: 87.9 kg (193 lb 12.6 oz) IBW/kg (Calculated) : 43.2 Heparin Dosing Weight:65kg   Vital Signs: Temp: 97.8 F (36.6 C) (02/29 2323) Temp Source: Oral (02/29 2323) BP: 157/125 (03/01 0034) Pulse Rate: 119 (03/01 0034)  Labs: Recent Labs    09/22/22 2050 09/22/22 2103 09/22/22 2134  HGB 13.0  --   --   HCT 41.3  --   --   PLT 232  --   --   LABPROT  --   --  16.0*  INR  --   --  1.3*  CREATININE 1.01* 1.00  --   TROPONINIHS 197*  --  170*    Estimated Creatinine Clearance: 69.2 mL/min (by C-G formula based on SCr of 1 mg/dL).   Medical History: Past Medical History:  Diagnosis Date   Abnormal Pap smear    Acute combined systolic and diastolic heart failure ([place]) 02/19/2019   Wt Readings from Last 3 Encounters:  09/18/21  84.7 kg  09/02/21  84.4 kg  08/31/21  84.2 kg         Acute heart failure ([place]) 01/29/2022   Acute on chronic combined systolic and diastolic CHF (congestive heart failure) ([place]) 02/19/2019   Acute on chronic congestive heart failure (HCC)    Acute on chronic congestive heart failure (HCC)    Acute on chronic diastolic CHF (congestive heart failure) ([place]) 10/19/2021   Acute on chronic systolic heart failure ([place]) 02/22/2022   Anemia    Asthma    Atrial fibrillation ([place])    history  of paroxysmal atrial fibrillation   CHF (congestive heart failure) ([place]) 11/02/2021   CHF exacerbation ([place]) 10/17/2021   Diabetes mellitus    DKA, type 2 ([place]) 02/06/2019   Elevated troponin 09/30/2019   Heart failure with reduced ejection fraction ([place])    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     Migraines    Morbid obesity ([place]) 02/06/2019   Nausea & vomiting 06/26/2022   Nonischemic cardiomyopathy ([place]) 09/22/2021   NSTEMI (non-ST elevated myocardial infarction) ([place]) 02/23/2022   Obstructive sleep apnea 06/30/2015   Polycystic ovarian disease     Medications:  Infusions:   Assessment: 15 yoF presents with ShOB and increased BLE swelling.  Differential includes ACS v. HF exacerbation.  PMH significant for CAD,  HF with EF<20%, Afib and hx VTE on warfarin PTA.  INR 1.3 on admit.  Patient reports she took dose 2/29. Pharmacy consulted to dose  IV heparin pending cardiology consult/subtherapeutic INR. Baseline Hg/pltc WNL.   Goal of Therapy:  INR 2-3 Heparin level 0.3-0.7 units/ml Monitor platelets by anticoagulation protocol: Yes   Plan:  IV heparin 3000 units IV bolus x1 IV heparin infusion at 900 units/hr Check heparin level 6h after heparin initiated Daily heparin level & CBC while on heparin F/U cardiology consult/recommendations  Netta Cedars PharmD 09/23/2022,12:43 AM

## 2022-09-23 NOTE — Progress Notes (Signed)
ANTICOAGULATION CONSULT NOTE - Follow Up Consult  Pharmacy Consult for Heparin Indication: chest pain/ACS  Allergies  Allergen Reactions   Fish Allergy Swelling   Shellfish Allergy Anaphylaxis, Swelling and Other (See Comments)    Airway involvement including EMS - Has Epi-Pen   Metformin And Related Diarrhea   Trulicity [Dulaglutide] Nausea And Vomiting and Other (See Comments)    Multiple episodes of vomiting over multiple days   Nsaids Other (See Comments)    Per PCP interferes with daily meds (heart failure)   Advil [Ibuprofen] Other (See Comments)    Per PCP interferes with daily meds (heart failure)    Patient Measurements: Height: '4\' 11"'$  (149.9 cm) Weight: 87.9 kg (193 lb 12.6 oz) IBW/kg (Calculated) : 43.2 Heparin Dosing Weight: 65 kg  Vital Signs: Temp: 97.6 F (36.4 C) (03/01 0652) Temp Source: Oral (03/01 0652) BP: 152/118 (03/01 0648) Pulse Rate: 110 (03/01 0648)  Labs: Recent Labs    09/22/22 2050 09/22/22 2103 09/22/22 2134 09/23/22 0514  HGB 13.0  --   --  12.4  HCT 41.3  --   --  39.6  PLT 232  --   --  228  LABPROT  --   --  16.0*  --   INR  --   --  1.3*  --   HEPARINUNFRC  --   --   --  0.24*  CREATININE 1.01* 1.00  --   --   TROPONINIHS 197*  --  170*  --     Estimated Creatinine Clearance: 69.2 mL/min (by C-G formula based on SCr of 1 mg/dL).   Medications:  Infusions:   heparin 900 Units/hr (09/23/22 0150)    Assessment: 73 yoF presents with ShOB, increased BLE swelling, chest pain, and intractable N/V (unable to take PO meds).  Differential includes ACS v. HF exacerbation.  PMH significant for CAD,  HF with EF<20%, Afib and hx PE on warfarin PTA.  INR subtherapeutic at 1.3 on admit.  Patient reports she took dose 2/29. Pharmacy consulted to dose IV heparin pending cardiology consult/subtherapeutic INR. 2/29 CTa: neg PE  Today, 09/23/2022: Heparin level 0.24, decreased to subtherapeutic on heparin 900 units/hr CBC:  Hgb and Plt  WNL No bleeding or complications reported, no IV interruptions or pauses reported.   Goal of Therapy:  INR 2-3 Heparin level 0.3-0.7 units/ml Monitor platelets by anticoagulation protocol: Yes   Plan:  Give heparin 1000 units bolus IV x 1 Increase to heparin IV infusion at 1050 units/hr Heparin level 6 hours after rate change Daily heparin level and CBC Follow up plans for warfarin anticoagulation.    Gretta Arab PharmD, BCPS [facility] main pharmacy [phone] 09/23/2022 8:02 AM

## 2022-09-23 NOTE — Assessment & Plan Note (Addendum)
Last echo 02/2022 with EF <20% and grade III diastolic dysfunction. BNP 688.8 and CXR with pulmonary vascular congestion. Appears significantly volume overloaded on exam. Current weight 193, dry weight ~180lbs. H/o subobtimal medication compliance, which is likely cause. -Admit to FMTS, med-tele, attending Dr Gwendlyn Deutscher -Cardiology consulted, appreciate recommendations -Lasix '80mg'$  IV x1 now -Continue home spironolactone '25mg'$  daily and Jardiance '10mg'$  daily -hold home metoprolol for now given unclear compliance  -strict I/Os, daily weights -daily BMP, Mg -f/u echo

## 2022-09-23 NOTE — Progress Notes (Signed)
FMTS Brief Progress Note  S:Seen in room. Patient endorsing significant BLE pain related to her edema and requesting something for pain. States she has started to see her UOP pick up since starting Lasix.    O: BP (!) 130/95 (BP Location: Right Arm)   Pulse (!) 104   Temp 98.3 F (36.8 C) (Oral)   Resp 20   Ht '4\' 11"'$  (1.499 m)   Wt 87.9 kg   LMP 06/22/2022 Comment: patient states no chance of pregnancy  SpO2 99%   BMI 39.14 kg/m   Gen: NAD Cardiac: Tachycardic, regular, 2/6 systolic murmur  3+ pitting edema to hips bilaterally   A/P: CHF Exacerbation - I have added risk stratification labs to her AM lab draw. - Continue IV Lasix '80mg'$  BID  - Echo pending  - On GDMT with jardiance, losartan, metoprolol, and spironolactone---continue  - Will treat leg pain with one time dose of oxycodone 2.'5mg'$    LV Thrombus and known PAF Currently on heparin gtt. Has failed Xarelto in the past due to bleeding - Could transition to Eliquis prior to discharge    Remainder of plan per day team.   - Orders reviewed. Labs for AM ordered  Eppie Gibson, MD 09/23/2022, 10:49 PM PGY-2, [place] Night Resident  Please page [phone] with questions.

## 2022-09-23 NOTE — Assessment & Plan Note (Signed)
In sinus rhythm currently, but would not be surprised if patient goes into a-fib RVR at some point due to significant volume overload. -tele monitoring -continue heparin gtt for now, anticipate discontinuing this pending cards recs. If heparin discontinued, resume home warfarin per pharmacy -holding home metoprolol during acute CHF exacerbation as I doubt good compliance at home

## 2022-09-23 NOTE — ED Provider Notes (Incomplete)
[place] [facility] Provider Note   CSN: [id number] Arrival date & time: 09/22/22  1933     History {Add pertinent medical, surgical, social history, OB history to HPI:1} Chief Complaint  Patient presents with  . Leg Swelling  . Shortness of Breath    [name] is a 47 y.o. 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 She c/o tightness in her chest, increase in her weight, worsening LE edema, and difficulty breathing worsening over the last 2 weeks. She has a known history of heart failure and is on either Lasix or torsemide twice a day.  She believes that she may be on 80 mg twice a day.  She states that her primary care provider changed it approximately 2 weeks ago but she has had no relief in her symptoms with increased dose.  She also reports intractable nausea and vomiting.  She has a history of this and typically takes Zofran at home but has been out for the last few days.  She reports that she is unable to keep anything down including her medications such as Coumadin.  She denies recent fever, cough, congestion, sore throat, body aches, abdominal pain, or diarrhea.  No known sick contacts.   Home Medications Prior to Admission medications   Medication Sig Start Date End Date Taking? Authorizing Provider  Accu-Chek Softclix Lancets lancets Use as instructed 02/12/19   Caroline More, DO  Accu-Chek Softclix Lancets lancets Use up 4 (four) times daily as directed 06/06/22   [place] Donnamarie Poag, DO  acetaminophen (TYLENOL) 500 MG tablet Take 500-1,000 mg by mouth every 6  (six) hours as needed for mild pain or headache.    [provider]  atorvastatin (LIPITOR) 80 MG tablet Take 1 tablet (80 mg total) by mouth daily. 09/08/22   Janina Mayo, MD  blood glucose meter kit and supplies Use up to 4 (four) times daily as directed 06/06/22   [place] Donnamarie Poag, DO  empagliflozin (JARDIANCE) 10 MG TABS tablet Take 1 tablet (10 mg total) by mouth daily. 07/01/22   Amin, Jeanella Flattery, MD  ferrous sulfate 325 (65 FE) MG tablet Take 1 tablet (325 mg total) by mouth daily with breakfast. 07/02/22   Amin, Jeanella Flattery, MD  glucose blood (ONETOUCH VERIO) test strip Use up to 4 (four) times daily as directed 06/06/22   [place] Donnamarie Poag, DO  hydrOXYzine (ATARAX) 10 MG tablet Take 1 tablet (10 mg total) by mouth 3 (three) times daily as needed. Patient taking differently: Take 10 mg by mouth 3 (three) times daily as needed for anxiety. 10/22/21   Gladys Damme, MD  insulin aspart (NOVOLOG FLEXPEN) 100 UNIT/ML FlexPen Inject 12 Units into the skin daily before supper. 07/01/22   Amin, Jeanella Flattery, MD  insulin glargine (LANTUS) 100 UNIT/ML Solostar Pen Inject 15 Units into the skin daily. 07/01/22   Amin, Jeanella Flattery, MD  Insulin Pen Needle (UNIFINE PENTIPS) 31G X 5 MM MISC USE AS DIRECTED 2 TIMES DAILY 09/02/21   Hensel, Jamal Collin, MD  losartan (COZAAR) 50 MG tablet Take 1 tablet (50 mg total) by  mouth daily. 07/01/22   Amin, Jeanella Flattery, MD  metoCLOPramide (REGLAN) 5 MG tablet Take 1 tablet (5 mg total) by mouth 4 (four) times daily -  before meals and at bedtime. 08/09/22   Precious Gilding, DO  metoprolol succinate (TOPROL-XL) 25 MG 24 hr tablet Take 1 tablet (25 mg total) by mouth daily. 07/01/22   Amin, Jeanella Flattery, MD  polyethylene glycol (MIRALAX / GLYCOLAX) 17 g packet Take 17 g by mouth daily as needed for mild constipation. 06/06/22   [place] Donnamarie Poag, DO  prochlorperazine (COMPAZINE) 5 MG tablet Take 1 tablet (5 mg total) by mouth every 6 (six) hours as needed for refractory  nausea / vomiting. 08/09/22   Precious Gilding, DO  senna-docusate (SENOKOT-S) 8.6-50 MG tablet Take 1 tablet by mouth at bedtime as needed for moderate constipation. 07/01/22   Amin, Jeanella Flattery, MD  sertraline (ZOLOFT) 50 MG tablet Take 50 mg by mouth daily.    [provider]  spironolactone (ALDACTONE) 25 MG tablet Take 25 mg by mouth daily. Take 1 Tablet Daily    [provider]  Torsemide 40 MG TABS Take 40 mg by mouth 2 (two) times daily. 09/08/22   Janina Mayo, MD  traZODone (DESYREL) 50 MG tablet Take 1 tablet (50 mg total) by mouth at bedtime. 07/01/22   Amin, Jeanella Flattery, MD  warfarin (COUMADIN) 7.5 MG tablet Take 1-2 tablets (7.5-15 mg total) by mouth daily as directed by [facility] Patient taking differently: Take 7.5-15 mg by mouth daily. Take 15 mg by mouth everyday except on Sundays take 7.'5mg'$  per patient 07/01/22   Damita Lack, MD  carvedilol (COREG) 3.125 MG tablet Take 1 tablet (3.125 mg total) by mouth 2 (two) times daily with a meal. 02/27/22 07/01/22  Oswald Hillock, MD      Allergies    Fish allergy, Shellfish allergy, Metformin and related, Trulicity [dulaglutide], Nsaids, and Advil [ibuprofen]    Review of Systems   Review of Systems  All other systems reviewed and are negative.   Physical Exam Updated Vital Signs BP (!) 147/118   Pulse (!) 104   Temp 97.8 F (36.6 C) (Oral)   Resp (!) 21   Ht '4\' 11"'$  (1.499 m)   Wt 87.9 kg   LMP 06/22/2022 Comment: patient states no chance of pregnancy  SpO2 100%   BMI 39.14 kg/m  Physical Exam Vitals and nursing note reviewed.  Constitutional:      General: She is in acute distress.     Appearance: Normal appearance. She is not toxic-appearing or diaphoretic.  HENT:     Head: Normocephalic and atraumatic.     Mouth/Throat:     Mouth: Mucous membranes are moist.  Eyes:     Extraocular Movements: Extraocular movements intact.     Conjunctiva/sclera: Conjunctivae normal.     Pupils: Pupils are  equal, round, and reactive to light.  Cardiovascular:     Rate and Rhythm: Regular rhythm. Tachycardia present.  Pulmonary:     Effort: Tachypnea and respiratory distress (mild) present.     Breath sounds: No stridor. Decreased breath sounds (mildly) present. No wheezing, rhonchi or rales.  Chest:     Chest wall: No mass, deformity, tenderness, crepitus or edema. There is no dullness to percussion.  Abdominal:     General: Abdomen is flat.     Palpations: Abdomen is soft. There is no mass.     Tenderness: There is no abdominal tenderness. There is no  guarding or rebound.     Comments: Slight distension which appears edematous  Musculoskeletal:        General: Normal range of motion.     Cervical back: Normal range of motion and neck supple.     Right lower leg: Tenderness present. Edema (2+ pitting) present.     Left lower leg: Tenderness present. Edema (2+ pitting) present.     Comments: 2+ DP and PT pulses bilaterally with normal sensation  Skin:    General: Skin is warm and dry.     Capillary Refill: Capillary refill takes less than 2 seconds.  Neurological:     General: No focal deficit present.     Mental Status: She is alert and oriented to person, place, and time. Mental status is at baseline.     Cranial Nerves: No cranial nerve deficit.     Motor: No weakness.  Psychiatric:        Mood and Affect: Mood normal.        Behavior: Behavior normal.     ED Results / Procedures / Treatments   Labs (all labs ordered are listed, but only abnormal results are displayed) Labs Reviewed  BASIC METABOLIC PANEL - Abnormal; Notable for the following components:      Result Value   Sodium 133 (*)    Potassium 3.2 (*)    Chloride 95 (*)    Glucose, Bld 245 (*)    Creatinine, Ser 1.01 (*)    Calcium 8.1 (*)    All other components within normal limits  BRAIN NATRIURETIC PEPTIDE - Abnormal; Notable for the following components:   B Natriuretic Peptide 688.8 (*)    All other  components within normal limits  CBC WITH DIFFERENTIAL/PLATELET - Abnormal; Notable for the following components:   RBC 5.81 (*)    MCV 71.1 (*)    MCH 22.4 (*)    RDW 24.5 (*)    All other components within normal limits  BLOOD GAS, VENOUS - Abnormal; Notable for the following components:   pH, Ven 7.5 (*)    pCO2, Ven 36 (*)    pO2, Ven 50 (*)    Bicarbonate 28.1 (*)    Acid-Base Excess 4.8 (*)    All other components within normal limits  PROTIME-INR - Abnormal; Notable for the following components:   Prothrombin Time 16.0 (*)    INR 1.3 (*)    All other components within normal limits  TROPONIN I (HIGH SENSITIVITY) - Abnormal; Notable for the following components:   Troponin I (High Sensitivity) 197 (*)    All other components within normal limits  TROPONIN I (HIGH SENSITIVITY) - Abnormal; Notable for the following components:   Troponin I (High Sensitivity) 170 (*)    All other components within normal limits  I-STAT CREATININE, ED    EKG EKG Interpretation  Date/Time:  Thursday September 22 2022 19:46:48 EST Ventricular Rate:  110 PR Interval:  132 QRS Duration: 83 QT Interval:  366 QTC Calculation: 496 R Axis:   -60 Text Interpretation: Sinus tachycardia Probable left atrial enlargement Left anterior fascicular block Probable anterior infarct, old No significant change since last tracing Confirmed by Blanchie Dessert [phone]) on 09/22/2022 7:58:59 PM  Radiology CT Angio Chest PE W and/or Wo Contrast  Result Date: 09/22/2022 CLINICAL DATA:  Pulmonary embolism suspected, high probability. Bilateral lower extremity edema and shortness of breath. EXAM: CT ANGIOGRAPHY CHEST WITH CONTRAST TECHNIQUE: Multidetector CT imaging of the chest was performed using the standard  protocol during bolus administration of intravenous contrast. Multiplanar CT image reconstructions and MIPs were obtained to evaluate the vascular anatomy. RADIATION DOSE REDUCTION: This exam was performed  according to the departmental dose-optimization program which includes automated exposure control, adjustment of the mA and/or kV according to patient size and/or use of iterative reconstruction technique. CONTRAST:  11m OMNIPAQUE IOHEXOL 350 MG/ML SOLN COMPARISON:  05/06/2022. FINDINGS: Cardiovascular: The heart is enlarged and there is no pericardial effusion. Scattered coronary artery calcifications are noted. There is atherosclerotic calcification of the aorta without evidence of aneurysm. The pulmonary trunk is normal in caliber. No pulmonary artery filling defect is seen. Mediastinum/Nodes: No mediastinal, hilar, or axillary lymphadenopathy by size criteria. The thyroid gland, trachea, and esophagus are within normal limits. Lungs/Pleura: There is a small right pleural effusion. Mild central bronchial wall thickening is noted bilaterally. Atelectasis is noted bilaterally. No pneumothorax. Upper Abdomen: No acute abnormality. Musculoskeletal: Anasarca is noted. Degenerative changes are present in the thoracic spine. No acute osseous abnormality. Review of the MIP images confirms the above findings. IMPRESSION: 1. No evidence of pulmonary embolism. 2. Mild central bronchial wall thickening bilaterally which may be infectious or inflammatory. 3. Small right pleural effusion. 4. Cardiomegaly with coronary artery calcifications. 5. Aortic atherosclerosis. 6. Anasarca. Electronically Signed   By: LBrett FairyM.D.   On: 09/22/2022 23:25   DG Chest 2 View  Result Date: 09/22/2022 CLINICAL DATA:  Shortness of breath, heart failure, extremity swelling. EXAM: CHEST - 2 VIEW COMPARISON:  08/13/2022. FINDINGS: Heart is enlarged and the mediastinal contour is within normal limits. The pulmonary vasculature is mildly distended. Subsegmental atelectasis is noted in the mid left lung. No effusion or pneumothorax. No acute osseous abnormality. IMPRESSION: Cardiomegaly with mild pulmonary vascular congestion.  Electronically Signed   By: LBrett FairyM.D.   On: 09/22/2022 20:29    Procedures .Critical Care  Performed by: GSuzzette Righter PA-C Authorized by: GSuzzette Righter PA-C   Critical care provider statement:    Critical care time (minutes):  35   Critical care start time:  09/22/2022 8:00 PM   Critical care end time:  09/23/2022 12:02 AM   Critical care time was exclusive of:  Separately billable procedures and treating other patients and teaching time   Critical care was necessary to treat or prevent imminent or life-threatening deterioration of the following conditions:  Cardiac failure, circulatory failure, CNS failure or compromise, dehydration, endocrine crisis, metabolic crisis, renal failure, respiratory failure, sepsis and shock   Critical care was time spent personally by me on the following activities:  Ordering and performing treatments and interventions, ordering and review of laboratory studies, ordering and review of radiographic studies, pulse oximetry, development of treatment plan with patient or surrogate, discussions with consultants, evaluation of patient's response to treatment, re-evaluation of patient's condition, review of old charts, examination of patient and obtaining history from patient or surrogate   I assumed direction of critical care for this patient from another provider in my specialty: no     Care discussed with: admitting provider     {Document cardiac monitor, telemetry assessment procedure when appropriate:1}  Medications Ordered in ED Medications  furosemide (LASIX) injection 80 mg (has no administration in time range)  ondansetron (ZOFRAN) injection 4 mg (4 mg Intravenous Given 09/22/22 2054)  morphine (PF) 4 MG/ML injection 4 mg (4 mg Intravenous Given 09/22/22 2054)  potassium chloride SA (KLOR-CON M) CR tablet 40 mEq (40 mEq Oral Given 09/22/22 2320)  iohexol (OMNIPAQUE)  350 MG/ML injection 100 mL (100 mLs Intravenous Contrast Given 09/22/22 2306)     ED Course/ Medical Decision Making/ A&P   {   Click here for ABCD2, HEART and other calculatorsREFRESH Note before signing :1}                          Medical Decision Making Amount and/or Complexity of Data Reviewed Labs: ordered. Decision-making details documented in ED Course. Radiology: ordered. Decision-making details documented in ED Course. ECG/medicine tests: ordered. Decision-making details documented in ED Course.  Risk Prescription drug management. Decision regarding hospitalization.   Medical Decision Making:   [name] is a 47 y.o. female who presented to the ED today with shortness of breath, lower extremity edema and, weight gain, chest pain detailed above.    External chart has been reviewed including previous ED visit and hospitalization. Patient's presentation is complicated by their history of combined systolic and diastolic heart failure, pulmonary embolus, intractable nausea and vomiting.  Patient placed on continuous vitals and telemetry monitoring while in ED which was reviewed periodically.  Complete initial physical exam performed, notably the patient was in mild respiratory distress, tachypneic, tachycardic, with decreased breath sounds, and with signs of edematous lower extremities and abdomen.  Otherwise, abdomen was nontender.  Patient was neurologically intact.    Reviewed and confirmed nursing documentation for past medical history, family history, social history.    Initial Assessment:   With the patient's presentation of shortness of breath, most likely diagnosis is acute heart failure exacerbation. Other diagnoses were considered including (but not limited to)  Cardiac (pericardial effusion and tamponade, arrhythmias, ischemia, etc) Respiratory (COPD, asthma, pneumonia, pneumothorax, primary pulmonary hypertension, PE/VQ mismatch) Hematological (anemia) Neuromuscular (ALS, Guillain-Barr, etc) . These are considered less likely due to  history of present illness and physical exam findings.   {crccopa:27899}  Initial Plan:  ***  ***Screening labs including CBC and Metabolic panel to evaluate for infectious or metabolic etiology of disease.  ***Urinalysis with reflex culture ordered to evaluate for UTI or relevant urologic/nephrologic pathology.  ***CXR to evaluate for structural/infectious intrathoracic pathology.  ***EKG to evaluate for cardiac pathology Objective evaluation as below reviewed   Initial Study Results:   Laboratory  All laboratory results reviewed without evidence of clinically relevant pathology.   ***Exceptions include: ***   ***EKG EKG was reviewed independently. Rate, rhythm, axis, intervals all examined and without medically relevant abnormality. ST segments without concerns for elevations.    Radiology:  All images reviewed independently. ***Agree with radiology report at this time.   CT Angio Chest PE W and/or Wo Contrast  Result Date: 09/22/2022 CLINICAL DATA:  Pulmonary embolism suspected, high probability. Bilateral lower extremity edema and shortness of breath. EXAM: CT ANGIOGRAPHY CHEST WITH CONTRAST TECHNIQUE: Multidetector CT imaging of the chest was performed using the standard protocol during bolus administration of intravenous contrast. Multiplanar CT image reconstructions and MIPs were obtained to evaluate the vascular anatomy. RADIATION DOSE REDUCTION: This exam was performed according to the departmental dose-optimization program which includes automated exposure control, adjustment of the mA and/or kV according to patient size and/or use of iterative reconstruction technique. CONTRAST:  165m OMNIPAQUE IOHEXOL 350 MG/ML SOLN COMPARISON:  05/06/2022. FINDINGS: Cardiovascular: The heart is enlarged and there is no pericardial effusion. Scattered coronary artery calcifications are noted. There is atherosclerotic calcification of the aorta without evidence of aneurysm. The pulmonary trunk is  normal in caliber. No pulmonary artery filling defect is seen.  Mediastinum/Nodes: No mediastinal, hilar, or axillary lymphadenopathy by size criteria. The thyroid gland, trachea, and esophagus are within normal limits. Lungs/Pleura: There is a small right pleural effusion. Mild central bronchial wall thickening is noted bilaterally. Atelectasis is noted bilaterally. No pneumothorax. Upper Abdomen: No acute abnormality. Musculoskeletal: Anasarca is noted. Degenerative changes are present in the thoracic spine. No acute osseous abnormality. Review of the MIP images confirms the above findings. IMPRESSION: 1. No evidence of pulmonary embolism. 2. Mild central bronchial wall thickening bilaterally which may be infectious or inflammatory. 3. Small right pleural effusion. 4. Cardiomegaly with coronary artery calcifications. 5. Aortic atherosclerosis. 6. Anasarca. Electronically Signed   By: Brett Fairy M.D.   On: 09/22/2022 23:25   DG Chest 2 View  Result Date: 09/22/2022 CLINICAL DATA:  Shortness of breath, heart failure, extremity swelling. EXAM: CHEST - 2 VIEW COMPARISON:  08/13/2022. FINDINGS: Heart is enlarged and the mediastinal contour is within normal limits. The pulmonary vasculature is mildly distended. Subsegmental atelectasis is noted in the mid left lung. No effusion or pneumothorax. No acute osseous abnormality. IMPRESSION: Cardiomegaly with mild pulmonary vascular congestion. Electronically Signed   By: Brett Fairy M.D.   On: 09/22/2022 20:29      Consults: Case discussed with ***.   Final Assessment and Plan:   ***    Clinical Impression:  1. Acute on chronic combined systolic and diastolic congestive heart failure (HCC)   2. Elevated troponin   3. Hypokalemia   4. Intractable nausea and vomiting   5. Pleural effusion   6. Anasarca      Admit    {Document critical care time when appropriate:1} {Document review of labs and clinical decision tools ie heart score, Chads2Vasc2  etc:1}  {Document your independent review of radiology images, and any outside records:1} {Document your discussion with family members, caretakers, and with consultants:1} {Document social determinants of health affecting pt's care:1} {Document your decision making why or why not admission, treatments were needed:1} Final Clinical Impression(s) / ED Diagnoses Final diagnoses:  Acute on chronic combined systolic and diastolic congestive heart failure (HCC)  Elevated troponin  Hypokalemia  Intractable nausea and vomiting  Pleural effusion  Anasarca    Rx / DC Orders ED Discharge Orders     None

## 2022-09-23 NOTE — Consult Note (Signed)
Cardiology Consultation   Patient ID: [name] MRN: [id number]; DOB: 20-Mar-1976  Admit date: 09/22/2022 Date of Consult: 09/23/2022  PCP:  Donney Dice, [place] Providers Cardiologist:  None  Electrophysiologist:  Will Meredith Leeds, MD    Patient Profile:   [name] is a 47 y.o. 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 Gwendlyn Deutscher.  History of Present Illness:   [name] is a 47 year old female with above medical history who is followed by Dr. Harl Bowie. She was previously followed by the advanced heart failure clinic, however was dismissed due to mistreatment of staff. Per chart review, patient was initially diagnosed with nonischemic cardiomyopathy in 12/2018. Echocardiogram at that time showed EF 20-25%. Diastolic function could not be evaluated as patient was in atrial fibrillation at the time of the study. She was seen by EP in 12/2018 as there were concerns that her cardiomyopathy was tachy induced. She was started on GDMT and her afib regiment was optimized. Follow up echocardiogram 3 months later in 03/2019 showed EF 0000000, diastolic parameters consistent with restrictive filling, LV diffuse hypokinesis, apical thrombus, moderate RV systolic dysfunction, moderate TR, mild MR. Since her EF had continued to be low, she underwent R/L heart catheterization on 03/29/2019 to rule out ischemia. Cath showed no obstructive CAD, R>L heart failure with low PAPI suggesting significant RV dysfunction, primarily pulmonary venous HTN, low cardiac output, CI 2.07. Patient was started on coumadin for LV thrombus. A follow up echo in 09/2019 showed EF <20%, moderate LVH, grade II diastolic dysfunction, severe RV dysfunction,  moderately elevated pulmonary artery systolic pressure. Echo from 12/2019 did show some improvement in EF to 35-40%, grade I DD   Patient was admitted to the hospital in 08/2021 for evaluation of CHF and chest pain. Echo from 09/18/21 showed EF 20%, grade III diastolic dysfunction, elevated LVEDP, severely reduced RV systolic function, moderately elevated PA systolic pressure, mild MR. Underwent R/L heart catheterization on 09/20/21 that showed mild, nonobstructive CAD, nonischemic cardiomyopathy, elevated right and left heart filling pressures, preserved cardiac output, pulmonary venous htn. At that time, Dr. Aundra Dubin believed that her cardiomyopathy could be caused by poorly controlled DM or HTN. Patient underwent Cardiac MRI to rule out myocarditis on 09/20/21. Study showed mildly dilated LV with EF 23%, diffuse hypokinesis with inferior akinesis, moderate RV systolic dysfunction with EF 32%. There was no definitive evidence of prior MI, infiltrative disease, myocarditis. LV thrombus was not seen on cMRI, continued on coumadin for prevention. Later, patient was transitioned to Xarelto due to poor compliance with coumadin and subtherapeutic INRs.   Patient was again admitted to the hospital in 10/2021 for evaluation of decompensated heart failure. Echocardiogram from 11/02/21 showed EF 20-25%, grade III diastolic dysfunction, normal RV systolic function, normal pulmonary artery systolic pressure. Patient had reported noncompliance with diuretics at home, but was compliant with her other medications.  Admitted in 02/2022 for acute on chronic CHF. Echocardiogram 02/23/22 showed EF less than [id number], grade 3 diastolic dysfunction, moderately reduced RV systolic function, mildly elevated pulmonary artery systolic pressure, mild mitral valve regurgitation.  There was an LV thrombus present at apex measuring 2.2 x 1.4 cm with some mobility. Patient was diuresed, discharged on p.o. Lasix  80 mg in the morning and 40 mg every  evening.  She remained on Xarelto for LV thrombus, paroxysmal atrial fibrillation, was later transitioned to Coumadin.  Admitted in 06/2022 for acute CHF.  She was treated with IV Lasix, discharged on Jardiance 10 mg daily, Lasix 80 mg twice daily, losartan 50 mg daily, spironolactone 12.5 mg daily, Toprol XL 25 mg daily.  She was last seen by cardiology on 09/08/2022 for an outpatient follow-up appointment.  At that time, patient had significant lower extremity edema.  She was transitioned from Lasix to torsemide.  She remained on Coumadin, and established with Coumadin clinic on 2/15.  Plan to recheck echo in 3 months with therapeutic INR.  Patient presented to the ED on 3/29 complaining of increased edema in bilateral lower extremities, shortness of breath.  Patient refused to go to [facility], and stated that she had been going to the [facility], ER about once a month to get fluid drained from her.  In the ED, BNP was elevated to 688.  High sensitive troponin 197, repeat 170.  Creatinine 1.01, potassium 3.2.  INR subtherapeutic at 1.3. chest x-ray showed cardiomegaly with mild pulmonary vascular congestion. EKG showed sinus tachycardia with heart rate 110 bpm, left anterior fascicular block, probable left atrial enlargement. Patient was transferred to [facility] cone for further evaluation and was admitted to the teaching service. Cardiology asked to consult.   On interview, patient reports that she has been having shortness of breath "for a while".  Believes that it has been about 2 weeks, however is unsure.  Breathing has been worsening over time, she started to have some shortness of breath at rest.  Also complains of orthopnea, abdominal distention, lower extremity edema.  Reports having chest pain, is unable to describe the chest pain.  Chest pain is occasionally worse on deep inhalation, occasionally worse on exertion.  Reports that her dry weight is usually 170-180 pounds.  She has been noticing weight gain.   Reports good compliance with all of her medications.  Reports that because of her chronic nausea/vomiting she does not eat very much.  Does not think that she has been eating a lot of sodium.  Past Medical History:  Diagnosis Date   Abnormal Pap smear    Acute combined systolic and diastolic heart failure ([place]) 02/19/2019   Wt Readings from Last 3 Encounters:  09/18/21  84.7 kg  09/02/21  84.4 kg  08/31/21  84.2 kg         Acute heart failure ([place]) 01/29/2022   Acute on chronic combined systolic and diastolic CHF (congestive heart failure) ([place]) 02/19/2019   Acute on chronic congestive heart failure (HCC)    Acute on chronic congestive heart failure (HCC)    Acute on chronic diastolic CHF (congestive heart failure) ([place]) 10/19/2021   Acute on chronic systolic heart failure ([place]) 02/22/2022   Anemia    Asthma    Atrial fibrillation ([place])    history of paroxysmal atrial fibrillation   CHF (congestive heart failure) ([place]) 11/02/2021   CHF exacerbation ([place]) 10/17/2021   Diabetes mellitus    DKA, type 2 ([place]) 02/06/2019   Elevated troponin 09/30/2019   Heart failure with reduced ejection fraction ([place])    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  Consider chronic etiology given continued fertility issues   Hypertension    Hypertensive emergency 09/30/2019   Hypertensive urgency 06/02/2022   Major  depressive disorder 02/08/2012   Reports that 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     Migraines    Morbid obesity ([place]) 02/06/2019   Nausea & vomiting 06/26/2022   Nonischemic cardiomyopathy ([place]) 09/22/2021   NSTEMI (non-ST elevated myocardial infarction) ([place]) 02/23/2022   Obstructive sleep apnea 06/30/2015   Polycystic ovarian disease     Past Surgical History:  Procedure Laterality Date   BIOPSY  11/05/2021   Procedure: BIOPSY;  Surgeon: Daryel November,  MD;  Location: [facility] ENDOSCOPY;  Service: Gastroenterology;;   CERVIX LESION DESTRUCTION     CESAREAN SECTION     ESOPHAGOGASTRODUODENOSCOPY (EGD) WITH PROPOFOL N/A 11/05/2021   Procedure: ESOPHAGOGASTRODUODENOSCOPY (EGD) WITH PROPOFOL;  Surgeon: Daryel November, MD;  Location: [facility] ENDOSCOPY;  Service: Gastroenterology;  Laterality: N/A;   RIGHT/LEFT HEART CATH AND CORONARY ANGIOGRAPHY N/A 03/29/2019   Procedure: RIGHT/LEFT HEART CATH AND CORONARY ANGIOGRAPHY;  Surgeon: Larey Dresser, MD;  Location: [place] CV LAB;  Service: Cardiovascular;  Laterality: N/A;   RIGHT/LEFT HEART CATH AND CORONARY ANGIOGRAPHY N/A 09/20/2021   Procedure: RIGHT/LEFT HEART CATH AND CORONARY ANGIOGRAPHY;  Surgeon: Larey Dresser, MD;  Location: [place] CV LAB;  Service: Cardiovascular;  Laterality: N/A;     Home Medications:  Prior to Admission medications   Medication Sig Start Date End Date Taking? Authorizing Provider  acetaminophen (TYLENOL) 500 MG tablet Take 500-1,000 mg by mouth every 6 (six) hours as needed for mild pain or headache.   Yes [provider]  atorvastatin (LIPITOR) 80 MG tablet Take 1 tablet (80 mg total) by mouth daily. 09/08/22  Yes Janina Mayo, MD  empagliflozin (JARDIANCE) 10 MG TABS tablet Take 1 tablet (10 mg total) by mouth daily. 07/01/22  Yes Amin, Ankit Chirag, MD  insulin aspart (NOVOLOG FLEXPEN) 100 UNIT/ML FlexPen Inject 12 Units into the skin daily before supper. 07/01/22  Yes Amin, Ankit Chirag, MD  insulin glargine (LANTUS) 100 UNIT/ML Solostar Pen Inject 15 Units into the skin daily. 07/01/22  Yes Amin, Jeanella Flattery, MD  losartan (COZAAR) 50 MG tablet Take 1 tablet (50 mg total) by mouth daily. 07/01/22  Yes Amin, Jeanella Flattery, MD  warfarin (COUMADIN) 7.5 MG tablet Take 1-2 tablets (7.5-15 mg total) by mouth daily as directed by Coumadin Clinic Patient taking differently: Take 7.5-11.25 mg by mouth as directed. Take 1 tablet (7.5 mg) tablet by mouth every Monday &  Take 1.5 tablets (11.25 mg) all other days 07/01/22  Yes Amin, Jeanella Flattery, MD  Accu-Chek Softclix Lancets lancets Use as instructed 02/12/19   Caroline More, DO  Accu-Chek Softclix Lancets lancets Use up 4 (four) times daily as directed 06/06/22   [place] Donnamarie Poag, DO  blood glucose meter kit and supplies Use up to 4 (four) times daily as directed 06/06/22   [place] Donnamarie Poag, DO  ferrous sulfate 325 (65 FE) MG tablet Take 1 tablet (325 mg total) by mouth daily with breakfast. Patient not taking: Reported on 09/23/2022 07/02/22   Damita Lack, MD  glucose blood (ONETOUCH VERIO) test strip Use up to 4 (four) times daily as directed 06/06/22   [place] Donnamarie Poag, DO  hydrOXYzine (ATARAX) 10 MG tablet Take 1 tablet (10 mg total) by mouth 3 (three) times daily as needed. Patient taking differently: Take 10 mg by mouth 3 (three) times daily as needed for anxiety. 10/22/21   Gladys Damme, MD  Insulin Pen Needle (UNIFINE  PENTIPS) 31G X 5 MM MISC USE AS DIRECTED 2 TIMES DAILY 09/02/21   Hensel, Jamal Collin, MD  metoCLOPramide (REGLAN) 5 MG tablet Take 1 tablet (5 mg total) by mouth 4 (four) times daily -  before meals and at bedtime. Patient not taking: Reported on 09/23/2022 08/09/22   Precious Gilding, DO  metoprolol succinate (TOPROL-XL) 25 MG 24 hr tablet Take 1 tablet (25 mg total) by mouth daily. 07/01/22   Amin, Jeanella Flattery, MD  polyethylene glycol (MIRALAX / GLYCOLAX) 17 g packet Take 17 g by mouth daily as needed for mild constipation. 06/06/22   [place] Donnamarie Poag, DO  prochlorperazine (COMPAZINE) 5 MG tablet Take 1 tablet (5 mg total) by mouth every 6 (six) hours as needed for refractory nausea / vomiting. 08/09/22   Precious Gilding, DO  senna-docusate (SENOKOT-S) 8.6-50 MG tablet Take 1 tablet by mouth at bedtime as needed for moderate constipation. 07/01/22   Amin, Jeanella Flattery, MD  sertraline (ZOLOFT) 50 MG tablet Take 50 mg by mouth daily.    [provider]  spironolactone (ALDACTONE) 25 MG tablet  Take 25 mg by mouth daily. Take 1 Tablet Daily    [provider]  Torsemide 40 MG TABS Take 40 mg by mouth 2 (two) times daily. 09/08/22   Janina Mayo, MD  traZODone (DESYREL) 50 MG tablet Take 1 tablet (50 mg total) by mouth at bedtime. 07/01/22   Damita Lack, MD  carvedilol (COREG) 3.125 MG tablet Take 1 tablet (3.125 mg total) by mouth 2 (two) times daily with a meal. 02/27/22 07/01/22  Oswald Hillock, MD    Inpatient Medications: Scheduled Meds:  atorvastatin  80 mg Oral QHS   empagliflozin  10 mg Oral Daily   insulin aspart  0-15 Units Subcutaneous TID WC   insulin glargine-yfgn  15 Units Subcutaneous Daily   losartan  50 mg Oral Daily   metoCLOPramide  5 mg Oral TID AC & HS   sertraline  50 mg Oral Daily   spironolactone  25 mg Oral Daily   traZODone  50 mg Oral QHS   Continuous Infusions:  heparin 1,050 Units/hr (09/23/22 0850)   magnesium sulfate bolus IVPB 2 g (09/23/22 1425)   PRN Meds: acetaminophen  Allergies:    Allergies  Allergen Reactions   Fish Allergy Swelling   Shellfish Allergy Anaphylaxis, Swelling and Other (See Comments)    Airway involvement including EMS - Has Epi-Pen   Metformin And Related Diarrhea   Trulicity [Dulaglutide] Nausea And Vomiting and Other (See Comments)    Multiple episodes of vomiting over multiple days   Nsaids Other (See Comments)    Per PCP interferes with daily meds (heart failure)   Advil [Ibuprofen] Other (See Comments)    Per PCP interferes with daily meds (heart failure)    Social History:   Social History   Socioeconomic History   Marital status: Single    Spouse name: Not on file   Number of children: Not on file   Years of education: Not on file   Highest education level: Not on file  Occupational History   Not on file  Tobacco Use   Smoking status: Never   Smokeless tobacco: Never   Tobacco comments:    Never smoke 10/22/21  Vaping Use   Vaping Use: Never used  Substance and Sexual Activity    Alcohol use: No    Alcohol/week: 0.0 standard drinks of alcohol   Drug use: No   Sexual  activity: Yes  Other Topics Concern   Not on file  Social History Narrative   Not on file   Social Determinants of Health   Financial Resource Strain: High Risk (10/17/2019)   Overall Financial Resource Strain (CARDIA)    Difficulty of Paying Living Expenses: Hard  Food Insecurity: No Food Insecurity (09/23/2022)   Hunger Vital Sign    Worried About Running Out of Food in the Last Year: Never true    Ran Out of Food in the Last Year: Never true  Transportation Needs: No Transportation Needs (09/23/2022)   PRAPARE - Hydrologist (Medical): No    Lack of Transportation (Non-Medical): No  Recent Concern: Transportation Needs - Unmet Transportation Needs (08/07/2022)   PRAPARE - Hydrologist (Medical): Yes    Lack of Transportation (Non-Medical): No  Physical Activity: Insufficiently Active (08/19/2020)   Exercise Vital Sign    Days of Exercise per Week: 7 days    Minutes of Exercise per Session: 20 min  Stress: Stress Concern Present (08/19/2020)   [place]    Feeling of Stress : Very much  Social Connections: Not on file  Intimate Partner Violence: Not At Risk (09/23/2022)   Humiliation, Afraid, Rape, and Kick questionnaire    Fear of Current or Ex-Partner: No    Emotionally Abused: No    Physically Abused: No    Sexually Abused: No    Family History:    Family History  Problem Relation Age of Onset   Diabetes Mother    Hypertension Mother    Hypertension Father    Diabetes Father      ROS:  Please see the history of present illness.   All other ROS reviewed and negative.     Physical Exam/Data:   Vitals:   09/23/22 0945 09/23/22 0948 09/23/22 1200 09/23/22 1439  BP: (!) 134/108  (!) 142/111 (!) 138/116  Pulse: (!) 110  (!) 113 (!) 104  Resp: '19  17 18   '$ Temp:  97.6 F (36.4 C) 98 F (36.7 C) 98.2 F (36.8 C)  TempSrc:  Oral Oral Oral  SpO2: 99%     Weight:      Height:        Intake/Output Summary (Last 24 hours) at 09/23/2022 1444 Last data filed at 09/23/2022 1430 Gross per 24 hour  Intake 582.69 ml  Output 600 ml  Net -17.31 ml      09/22/2022    7:41 PM 09/08/2022   11:19 AM 08/17/2022   10:04 AM  Last 3 Weights  Weight (lbs) 193 lb 12.6 oz 193 lb 12.8 oz 179 lb  Weight (kg) 87.9 kg 87.907 kg 81.194 kg     Body mass index is 39.14 kg/m.  General: Middle aged female, laying the bed flat with head elevated.  HEENT: normal Neck: no JVD Vascular: Radial pulses 2+ bilaterally Cardiac:  normal S1, S2; Regular rhythm, tachycardic. Grade 2/6 systolic murmur at apex  Lungs:  crackles in bilateral lung bases.  Abd: soft, nontender, no hepatomegaly  Ext: no edema Musculoskeletal:  No deformities, BUE and BLE strength normal and equal Skin: warm and dry  Neuro:  CNs 2-12 intact, no focal abnormalities noted Psych:  Normal affect   EKG:  The EKG was personally reviewed and demonstrates:  sinus tachycardia with heart rate 110 bpm, left anterior fascicular block, probable left atrial enlargement. Telemetry:  Telemetry was  personally reviewed and demonstrates:  Sinus tachycardia, HR 100-110 BPM. One 5 beat run of NSVT   Relevant CV Studies:   Laboratory Data:  High Sensitivity Troponin:   Recent Labs  Lab 09/22/22 2050 09/22/22 2134  TROPONINIHS 197* 170*     Chemistry Recent Labs  Lab 09/22/22 2050 09/22/22 2103 09/23/22 1218  NA 133*  --  133*  K 3.2*  --  3.6  CL 95*  --  96*  CO2 26  --  22  GLUCOSE 245*  --  267*  BUN 15  --  14  CREATININE 1.01* 1.00 0.98  CALCIUM 8.1*  --  8.1*  MG  --   --  1.3*  GFRNONAA >60  --  >60  ANIONGAP 12  --  15    No results for input(s): "PROT", "ALBUMIN", "AST", "ALT", "ALKPHOS", "BILITOT" in the last 168 hours. Lipids No results for input(s): "CHOL", "TRIG", "HDL",  "LABVLDL", "LDLCALC", "CHOLHDL" in the last 168 hours.  Hematology Recent Labs  Lab 09/22/22 2050 09/23/22 0514  WBC 8.7 7.5  RBC 5.81* 5.55*  HGB 13.0 12.4  HCT 41.3 39.6  MCV 71.1* 71.4*  MCH 22.4* 22.3*  MCHC 31.5 31.3  RDW 24.5* 24.8*  PLT 232 228   Thyroid No results for input(s): "TSH", "FREET4" in the last 168 hours.  BNP Recent Labs  Lab 09/22/22 2050  BNP 688.8*    DDimer No results for input(s): "DDIMER" in the last 168 hours.   Radiology/Studies:  CT Angio Chest PE W and/or Wo Contrast  Result Date: 09/22/2022 CLINICAL DATA:  Pulmonary embolism suspected, high probability. Bilateral lower extremity edema and shortness of breath. EXAM: CT ANGIOGRAPHY CHEST WITH CONTRAST TECHNIQUE: Multidetector CT imaging of the chest was performed using the standard protocol during bolus administration of intravenous contrast. Multiplanar CT image reconstructions and MIPs were obtained to evaluate the vascular anatomy. RADIATION DOSE REDUCTION: This exam was performed according to the departmental dose-optimization program which includes automated exposure control, adjustment of the mA and/or kV according to patient size and/or use of iterative reconstruction technique. CONTRAST:  181m OMNIPAQUE IOHEXOL 350 MG/ML SOLN COMPARISON:  05/06/2022. FINDINGS: Cardiovascular: The heart is enlarged and there is no pericardial effusion. Scattered coronary artery calcifications are noted. There is atherosclerotic calcification of the aorta without evidence of aneurysm. The pulmonary trunk is normal in caliber. No pulmonary artery filling defect is seen. Mediastinum/Nodes: No mediastinal, hilar, or axillary lymphadenopathy by size criteria. The thyroid gland, trachea, and esophagus are within normal limits. Lungs/Pleura: There is a small right pleural effusion. Mild central bronchial wall thickening is noted bilaterally. Atelectasis is noted bilaterally. No pneumothorax. Upper Abdomen: No acute  abnormality. Musculoskeletal: Anasarca is noted. Degenerative changes are present in the thoracic spine. No acute osseous abnormality. Review of the MIP images confirms the above findings. IMPRESSION: 1. No evidence of pulmonary embolism. 2. Mild central bronchial wall thickening bilaterally which may be infectious or inflammatory. 3. Small right pleural effusion. 4. Cardiomegaly with coronary artery calcifications. 5. Aortic atherosclerosis. 6. Anasarca. Electronically Signed   By: LBrett FairyM.D.   On: 09/22/2022 23:25   DG Chest 2 View  Result Date: 09/22/2022 CLINICAL DATA:  Shortness of breath, heart failure, extremity swelling. EXAM: CHEST - 2 VIEW COMPARISON:  08/13/2022. FINDINGS: Heart is enlarged and the mediastinal contour is within normal limits. The pulmonary vasculature is mildly distended. Subsegmental atelectasis is noted in the mid left lung. No effusion or pneumothorax. No acute osseous abnormality. IMPRESSION:  Cardiomegaly with mild pulmonary vascular congestion. Electronically Signed   By: Brett Fairy M.D.   On: 09/22/2022 20:29     Assessment and Plan:   Acute on Chronic combined systolic and diastolic heart failure  Nonischemic cardiomyopathy  - Most recent echocardiogram from 02/23/22 showed EF <20%, grade III diastolic dysfunction, moderately reduced RV systolic function, mildly elevated pulmonary artery systolic pressure  - Cardiac MRI 08/2021 showed mildly dilated LV with EF 23%, moderate RV systolic dysfunction. There was no evidence of prior MI, infiltrative disease, myocarditis.  - R/L heart catheterization from 08/2021 showed mild, nonobstructive CAD - Patient has been admitted several times in the past for CHF exacerbation/fluid overload. Often in the setting of medication noncompliance or failure to follow low sodium diet  - Now, patient presented complaining of lower extremity edema, shortness of breath  - BNP elevated to 688. CXR with cardiomegaly and mild pulmonary  vascular congestion  - Patient has now received IV lasix 80 mg x2 - Continue IV lasix as renal function allows. Patient reports that her dry weight is approx 180 lbs. Weight today 193 lbs  - Resume metoprolol succinate 25 mg daily-- no signs of shock  - Continue losartan 50 mg daily, spironolactone 25 mg daily, jardiance 10 mg daily. Patient has not tolerated entresto in the past due to hypotension  - Strict I/Os, daily weights, daily BMPs   Elevated Troponin  - hsTn 197>170  - Suspect this is demand ischemia in the setting of CHF  - Patient does complain of chest pain, but symptoms are very vague. Troponin trend is not consistent with ACS and patient had clean coronaries on cath from 08/2021. No plans for ischemic evaluation at this time   LV thrombus  Subtherapeutic INR on coumadin  - Echocardiogram from 02/2022 showed an LV thrombus present at apex measuring 2.2 x 1.4 with some mobility  - Patient was on Xarelto at one point, but has more recently been on coumadin  - INR subtherapeutic at 1.3  - Currently on IV heparin. Should stay on heparin while INR subtherapeutic. Could also bridge with lovenox . Given patient's history of medication noncompliance, would be best to have her on a DOAC  Paroxysmal Atrial Fibrillation  - Patient is in sinus tachycardia with HR 100-110 - Resume home metoprolol succinate 25 mg daily as above  - Patient was on coumadin, INR subtherapeutic at 1.3. Currently on IV heparin.   Mild Mitral Valve regurgitation  Mild-moderate tricuspid valve regurgitation  - Echocardiogram this admission pending  Otherwise per primary  - Type 2 DM, poorly controlled  - Chronic nausea   Risk Assessment/Risk Scores:    New York Heart Association (NYHA) Functional Class NYHA Class IV  CHA2DS2-VASc Score = 5   This indicates a 7.2% annual risk of stroke. The patient's score is based upon: CHF History: 1 HTN History: 1 Diabetes History: 1 Stroke History: 0 Vascular  Disease History: 1 Age Score: 0 Gender Score: 1    For questions or updates, please contact [place] Please consult [email or web address] for contact info under    Signed, Margie Billet, PA-C  09/23/2022 2:44 PM

## 2022-09-23 NOTE — Assessment & Plan Note (Signed)
BP elevated on admission -Continue home losartan '50mg'$  daily

## 2022-09-23 NOTE — Assessment & Plan Note (Addendum)
Trop elevated to 197 but trended down to 170. No EKG changes. Likely demand ischemia secondary to CHF exacerbation. -Cardiology consulted, appreciate recommendations -Continue heparin gtt for now, anticipate this will be discontinued pending cards recs

## 2022-09-23 NOTE — Assessment & Plan Note (Signed)
Likely secondary to gastroparesis although this has not been formally diagnosed. Borderline prolonged QT on EKG. -Compazine '5mg'$  IV x1 now -Continue home Reglan '5mg'$  qAC and qHS -Daily EKG

## 2022-09-23 NOTE — Plan of Care (Signed)

## 2022-09-23 NOTE — Assessment & Plan Note (Deleted)
Last A1c >15.5 (06/02/22) -Check updated A1c -CBG monitoring qAC and qHS -Semglee ** u daily -mSSI

## 2022-09-23 NOTE — Assessment & Plan Note (Addendum)
Last A1c >15.5 (06/02/22) -Check updated A1c -CBG monitoring qAC and qHS -Semglee 15u daily -mSSI

## 2022-09-24 ENCOUNTER — Inpatient Hospital Stay (HOSPITAL_COMMUNITY): Payer: Medicare Other

## 2022-09-24 ENCOUNTER — Encounter (HOSPITAL_COMMUNITY)
Admission: EM | Disposition: A | Discharge status: Rehabilitation Facility | Payer: Self-pay | Source: Home / Self Care | Attending: Neurology

## 2022-09-24 ENCOUNTER — Inpatient Hospital Stay (HOSPITAL_COMMUNITY): Payer: Medicare Other | Admitting: Certified Registered Nurse Anesthetist

## 2022-09-24 DIAGNOSIS — E1159 Type 2 diabetes mellitus with other circulatory complications: Secondary | ICD-10-CM

## 2022-09-24 DIAGNOSIS — I6601 Occlusion and stenosis of right middle cerebral artery: Secondary | ICD-10-CM | POA: Diagnosis present

## 2022-09-24 DIAGNOSIS — I252 Old myocardial infarction: Secondary | ICD-10-CM

## 2022-09-24 DIAGNOSIS — E1165 Type 2 diabetes mellitus with hyperglycemia: Secondary | ICD-10-CM

## 2022-09-24 DIAGNOSIS — I2510 Atherosclerotic heart disease of native coronary artery without angina pectoris: Secondary | ICD-10-CM | POA: Diagnosis not present

## 2022-09-24 DIAGNOSIS — I110 Hypertensive heart disease with heart failure: Secondary | ICD-10-CM

## 2022-09-24 DIAGNOSIS — R601 Generalized edema: Secondary | ICD-10-CM | POA: Diagnosis not present

## 2022-09-24 DIAGNOSIS — I513 Intracardiac thrombosis, not elsewhere classified: Secondary | ICD-10-CM

## 2022-09-24 DIAGNOSIS — I5043 Acute on chronic combined systolic (congestive) and diastolic (congestive) heart failure: Secondary | ICD-10-CM | POA: Diagnosis not present

## 2022-09-24 DIAGNOSIS — I509 Heart failure, unspecified: Secondary | ICD-10-CM | POA: Diagnosis not present

## 2022-09-24 DIAGNOSIS — I480 Paroxysmal atrial fibrillation: Secondary | ICD-10-CM

## 2022-09-24 DIAGNOSIS — I152 Hypertension secondary to endocrine disorders: Secondary | ICD-10-CM

## 2022-09-24 HISTORY — PX: IR CT HEAD LTD: IMG2386

## 2022-09-24 HISTORY — PX: IR PERCUTANEOUS ART THROMBECTOMY/INFUSION INTRACRANIAL INC DIAG ANGIO: IMG6087

## 2022-09-24 HISTORY — PX: RADIOLOGY WITH ANESTHESIA: SHX6223

## 2022-09-24 LAB — CBC
HCT: 39.4 % (ref 36.0–46.0)
Hemoglobin: 12.3 g/dL (ref 12.0–15.0)
MCH: 22.2 pg — ABNORMAL LOW (ref 26.0–34.0)
MCHC: 31.2 g/dL (ref 30.0–36.0)
MCV: 71 fL — ABNORMAL LOW (ref 80.0–100.0)
Platelets: 213 10*3/uL (ref 150–400)
RBC: 5.55 MIL/uL — ABNORMAL HIGH (ref 3.87–5.11)
RDW: 23.9 % — ABNORMAL HIGH (ref 11.5–15.5)
WBC: 6.7 10*3/uL (ref 4.0–10.5)
nRBC: 0 % (ref 0.0–0.2)

## 2022-09-24 LAB — LIPID PANEL
Cholesterol: 136 mg/dL (ref 0–200)
HDL: 27 mg/dL — ABNORMAL LOW (ref >40)
LDL Cholesterol: 97 mg/dL (ref 0–99)
Total CHOL/HDL Ratio: 5 RATIO
Triglycerides: 62 mg/dL (ref <150)
VLDL: 12 mg/dL (ref 0–40)

## 2022-09-24 LAB — BASIC METABOLIC PANEL
Anion gap: 9 (ref 5–15)
Anion gap: 12 (ref 5–15)
BUN: 12 mg/dL (ref 6–20)
BUN: 12 mg/dL (ref 6–20)
CO2: 28 mmol/L (ref 22–32)
CO2: 27 mmol/L (ref 22–32)
Calcium: 8.4 mg/dL — ABNORMAL LOW (ref 8.9–10.3)
Calcium: 8.6 mg/dL — ABNORMAL LOW (ref 8.9–10.3)
Chloride: 96 mmol/L — ABNORMAL LOW (ref 98–111)
Chloride: 96 mmol/L — ABNORMAL LOW (ref 98–111)
Creatinine, Ser: 1.04 mg/dL — ABNORMAL HIGH (ref 0.44–1.00)
Creatinine, Ser: 1.08 mg/dL — ABNORMAL HIGH (ref 0.44–1.00)
GFR, Estimated: >60 mL/min (ref >60)
GFR, Estimated: >60 mL/min (ref >60)
Glucose, Bld: 98 mg/dL (ref 70–99)
Glucose, Bld: 151 mg/dL — ABNORMAL HIGH (ref 70–99)
Potassium: 3.3 mmol/L — ABNORMAL LOW (ref 3.5–5.1)
Potassium: 4.1 mmol/L (ref 3.5–5.1)
Sodium: 133 mmol/L — ABNORMAL LOW (ref 135–145)
Sodium: 135 mmol/L (ref 135–145)

## 2022-09-24 LAB — MRSA NEXT GEN BY PCR, NASAL: MRSA by PCR Next Gen: NOT DETECTED

## 2022-09-24 LAB — HEMOGLOBIN A1C
Hgb A1c MFr Bld: 10.8 % — ABNORMAL HIGH (ref 4.8–5.6)
Mean Plasma Glucose: 263 mg/dL

## 2022-09-24 LAB — GLUCOSE, CAPILLARY
Glucose-Capillary: 133 mg/dL — ABNORMAL HIGH (ref 70–99)
Glucose-Capillary: 168 mg/dL — ABNORMAL HIGH (ref 70–99)
Glucose-Capillary: 172 mg/dL — ABNORMAL HIGH (ref 70–99)
Glucose-Capillary: 133 mg/dL — ABNORMAL HIGH (ref 70–99)

## 2022-09-24 LAB — TSH: TSH: 3.269 u[IU]/mL (ref 0.350–4.500)

## 2022-09-24 LAB — MAGNESIUM
Magnesium: 1.6 mg/dL — ABNORMAL LOW (ref 1.7–2.4)
Magnesium: 1.9 mg/dL (ref 1.7–2.4)

## 2022-09-24 LAB — HEPARIN LEVEL (UNFRACTIONATED): Heparin Unfractionated: 0.47 IU/mL (ref 0.30–0.70)

## 2022-09-24 SURGERY — IR WITH ANESTHESIA
Anesthesia: General

## 2022-09-24 MED ORDER — SODIUM CHLORIDE 0.9 % IV SOLN: INTRAVENOUS | Status: DC

## 2022-09-24 MED ORDER — PHENYLEPHRINE HCL-NACL 20-0.9 MG/250ML-% IV SOLN
INTRAVENOUS | Status: DC | PRN
  Administered 2022-09-24: 100 ug/min via INTRAVENOUS

## 2022-09-24 MED ORDER — POTASSIUM CHLORIDE CRYS ER 20 MEQ PO TBCR
40.0000 meq | EXTENDED_RELEASE_TABLET | Freq: Once | ORAL | Status: AC
  Administered 2022-09-24: 40 meq via ORAL
  Filled 2022-09-24: qty 2

## 2022-09-24 MED ORDER — ACETAMINOPHEN 325 MG PO TABS
650.0000 mg | ORAL_TABLET | ORAL | Status: DC | PRN
  Administered 2022-09-30 (×2): 650 mg via ORAL
  Filled 2022-09-24 (×3): qty 2

## 2022-09-24 MED ORDER — DOCUSATE SODIUM 50 MG/5ML PO LIQD
100.0000 mg | Freq: Two times a day (BID) | ORAL | Status: DC
  Administered 2022-09-24 – 2022-09-26 (×4): 100 mg
  Filled 2022-09-24 (×5): qty 10

## 2022-09-24 MED ORDER — SODIUM CHLORIDE (PF) 0.9 % IJ SOLN
INTRAVENOUS | Status: AC | PRN
  Administered 2022-09-24 (×3): 25 ug via INTRA_ARTERIAL

## 2022-09-24 MED ORDER — MAGNESIUM SULFATE 2 GM/50ML IV SOLN
2.0000 g | Freq: Once | INTRAVENOUS | Status: AC
  Administered 2022-09-24: 2 g via INTRAVENOUS
  Filled 2022-09-24: qty 50

## 2022-09-24 MED ORDER — ORAL CARE MOUTH RINSE: 15.0000 mL | OROMUCOSAL | Status: DC | PRN

## 2022-09-24 MED ORDER — CLEVIDIPINE BUTYRATE 0.5 MG/ML IV EMUL: 0.0000 mg/h | INTRAVENOUS | Status: AC

## 2022-09-24 MED ORDER — FENTANYL CITRATE PF 50 MCG/ML IJ SOSY: 50.0000 ug | PREFILLED_SYRINGE | INTRAMUSCULAR | Status: DC | PRN

## 2022-09-24 MED ORDER — FENTANYL CITRATE PF 50 MCG/ML IJ SOSY
50.0000 ug | PREFILLED_SYRINGE | INTRAMUSCULAR | Status: DC | PRN
  Administered 2022-09-24 (×2): 50 ug via INTRAVENOUS
  Filled 2022-09-24 (×2): qty 1

## 2022-09-24 MED ORDER — ORAL CARE MOUTH RINSE
15.0000 mL | OROMUCOSAL | Status: DC
  Administered 2022-09-24 – 2022-09-27 (×34): 15 mL via OROMUCOSAL

## 2022-09-24 MED ORDER — VASOPRESSIN 20 UNIT/ML IV SOLN
INTRAVENOUS | Status: DC | PRN
  Administered 2022-09-24: 2 [IU] via INTRAVENOUS
  Administered 2022-09-24 (×2): 5 [IU] via INTRAVENOUS
  Administered 2022-09-24: 1 [IU] via INTRAVENOUS
  Administered 2022-09-24: 5 [IU] via INTRAVENOUS
  Administered 2022-09-24: 2 [IU] via INTRAVENOUS

## 2022-09-24 MED ORDER — IOHEXOL 300 MG/ML  SOLN
150.0000 mL | Freq: Once | INTRAMUSCULAR | Status: AC | PRN
  Administered 2022-09-24: 70 mL via INTRA_ARTERIAL

## 2022-09-24 MED ORDER — CEFAZOLIN SODIUM-DEXTROSE 2-3 GM-%(50ML) IV SOLR
INTRAVENOUS | Status: DC | PRN
  Administered 2022-09-24: 2 g via INTRAVENOUS

## 2022-09-24 MED ORDER — METOCLOPRAMIDE HCL 5 MG PO TABS
5.0000 mg | ORAL_TABLET | Freq: Three times a day (TID) | ORAL | Status: DC
  Administered 2022-09-24 – 2022-09-28 (×14): 5 mg
  Filled 2022-09-24 (×15): qty 1

## 2022-09-24 MED ORDER — FENTANYL CITRATE (PF) 100 MCG/2ML IJ SOLN
INTRAMUSCULAR | Status: AC
  Filled 2022-09-24: qty 2

## 2022-09-24 MED ORDER — FENTANYL CITRATE (PF) 100 MCG/2ML IJ SOLN
INTRAMUSCULAR | Status: DC | PRN
  Administered 2022-09-24: 100 ug via INTRAVENOUS

## 2022-09-24 MED ORDER — ROCURONIUM BROMIDE 10 MG/ML (PF) SYRINGE
PREFILLED_SYRINGE | INTRAVENOUS | Status: DC | PRN
  Administered 2022-09-24: 100 mg via INTRAVENOUS

## 2022-09-24 MED ORDER — IOHEXOL 350 MG/ML SOLN
75.0000 mL | Freq: Once | INTRAVENOUS | Status: AC | PRN
  Administered 2022-09-24: 75 mL via INTRAVENOUS

## 2022-09-24 MED ORDER — ACETAMINOPHEN 650 MG RE SUPP: 650.0000 mg | RECTAL | Status: DC | PRN

## 2022-09-24 MED ORDER — CHLORHEXIDINE GLUCONATE CLOTH 2 % EX PADS
6.0000 | MEDICATED_PAD | Freq: Every day | CUTANEOUS | Status: DC
  Administered 2022-09-25 – 2022-10-01 (×7): 6 via TOPICAL

## 2022-09-24 MED ORDER — CEFAZOLIN SODIUM-DEXTROSE 2-4 GM/100ML-% IV SOLN
INTRAVENOUS | Status: AC
  Filled 2022-09-24: qty 100

## 2022-09-24 MED ORDER — APIXABAN 5 MG PO TABS: 5.0000 mg | ORAL_TABLET | Freq: Two times a day (BID) | ORAL | Status: DC

## 2022-09-24 MED ORDER — SODIUM CHLORIDE 0.9 % IV SOLN: INTRAVENOUS | Status: DC | PRN

## 2022-09-24 MED ORDER — EPINEPHRINE 1 MG/10ML IJ SOSY
PREFILLED_SYRINGE | INTRAMUSCULAR | Status: DC | PRN
  Administered 2022-09-24: 95 ug via INTRAVENOUS
  Administered 2022-09-24: 5 ug via INTRAVENOUS

## 2022-09-24 MED ORDER — PROPOFOL 500 MG/50ML IV EMUL
INTRAVENOUS | Status: DC | PRN
  Administered 2022-09-24: 50 ug/kg/min via INTRAVENOUS

## 2022-09-24 MED ORDER — IOHEXOL 300 MG/ML  SOLN
50.0000 mL | Freq: Once | INTRAMUSCULAR | Status: AC | PRN
  Administered 2022-09-24: 40 mL via INTRA_ARTERIAL

## 2022-09-24 MED ORDER — ATORVASTATIN CALCIUM 80 MG PO TABS
80.0000 mg | ORAL_TABLET | Freq: Every day | ORAL | Status: DC
  Administered 2022-09-24 – 2022-09-28 (×5): 80 mg
  Filled 2022-09-24 (×5): qty 1

## 2022-09-24 MED ORDER — ETOMIDATE 2 MG/ML IV SOLN
INTRAVENOUS | Status: DC | PRN
  Administered 2022-09-24: 10 mg via INTRAVENOUS

## 2022-09-24 MED ORDER — PHENYLEPHRINE HCL-NACL 20-0.9 MG/250ML-% IV SOLN
0.0000 ug/min | INTRAVENOUS | Status: DC
  Administered 2022-09-24: 40 ug/min via INTRAVENOUS
  Administered 2022-09-24: 10 ug/min via INTRAVENOUS
  Administered 2022-09-25: 40 ug/min via INTRAVENOUS
  Filled 2022-09-24 (×3): qty 250

## 2022-09-24 MED ORDER — ACETAMINOPHEN 160 MG/5ML PO SOLN
650.0000 mg | ORAL | Status: DC | PRN
  Administered 2022-09-27 – 2022-09-29 (×5): 650 mg
  Filled 2022-09-24 (×5): qty 20.3

## 2022-09-24 MED ORDER — POLYETHYLENE GLYCOL 3350 17 G PO PACK
17.0000 g | PACK | Freq: Every day | ORAL | Status: DC
  Administered 2022-09-24 – 2022-09-26 (×2): 17 g
  Filled 2022-09-24 (×2): qty 1

## 2022-09-24 MED ORDER — DEXAMETHASONE SODIUM PHOSPHATE 10 MG/ML IJ SOLN
INTRAMUSCULAR | Status: DC | PRN
  Administered 2022-09-24: 4 mg via INTRAVENOUS

## 2022-09-24 MED ORDER — LIDOCAINE 2% (20 MG/ML) 5 ML SYRINGE
INTRAMUSCULAR | Status: DC | PRN
  Administered 2022-09-24: 60 mg via INTRAVENOUS

## 2022-09-24 MED ORDER — EPHEDRINE SULFATE-NACL 50-0.9 MG/10ML-% IV SOSY
PREFILLED_SYRINGE | INTRAVENOUS | Status: DC | PRN
  Administered 2022-09-24: 10 mg via INTRAVENOUS
  Administered 2022-09-24: 15 mg via INTRAVENOUS

## 2022-09-24 MED ORDER — STROKE: EARLY STAGES OF RECOVERY BOOK
Freq: Once | Status: AC
  Filled 2022-09-24: qty 1

## 2022-09-24 MED ORDER — PROPOFOL 1000 MG/100ML IV EMUL
0.0000 ug/kg/min | INTRAVENOUS | Status: DC
  Administered 2022-09-24 – 2022-09-25 (×2): 10 ug/kg/min via INTRAVENOUS
  Administered 2022-09-25: 20 ug/kg/min via INTRAVENOUS
  Administered 2022-09-25: 10 ug/kg/min via INTRAVENOUS
  Administered 2022-09-26 (×2): 5 ug/kg/min via INTRAVENOUS
  Filled 2022-09-24 (×6): qty 100

## 2022-09-24 MED ORDER — SUCCINYLCHOLINE CHLORIDE 200 MG/10ML IV SOSY
PREFILLED_SYRINGE | INTRAVENOUS | Status: DC | PRN
  Administered 2022-09-24: 80 mg via INTRAVENOUS

## 2022-09-24 MED ORDER — NITROGLYCERIN 1 MG/10 ML FOR IR/CATH LAB
INTRA_ARTERIAL | Status: AC
  Filled 2022-09-24: qty 10

## 2022-09-24 MED ORDER — ONDANSETRON HCL 4 MG/2ML IJ SOLN
INTRAMUSCULAR | Status: DC | PRN
  Administered 2022-09-24: 4 mg via INTRAVENOUS

## 2022-09-24 NOTE — Anesthesia Postprocedure Evaluation (Signed)
Anesthesia Post Note  Patient: [name]  Procedure(s) Performed: IR WITH ANESTHESIA     Patient location during evaluation: SICU Anesthesia Type: General Level of consciousness: sedated Pain management: pain level controlled Vital Signs Assessment: post-procedure vital signs reviewed and stable Respiratory status: patient remains intubated per anesthesia plan Cardiovascular status: stable Postop Assessment: no apparent nausea or vomiting Anesthetic complications: yes Comments: Intraoperative refractory hypotension requiring chest compressions to restore blood pressure. Questionable peripheral intravenous access. Central line placed post-procedure.    No notable events documented.  Last Vitals:  Vitals:   09/24/22 1713 09/24/22 1800  BP: 138/80 (!) 131/97  Pulse: (!) 110   Resp:    Temp: 37 C (!) 34.6 C  SpO2: 98% 99%    Last Pain:  Vitals:   09/24/22 1822  TempSrc: Core  PainSc:                  Karyl Kinnier Azad Calame

## 2022-09-24 NOTE — Anesthesia Procedure Notes (Signed)
Procedure Name: Intubation Date/Time: 09/24/2022 2:43 PM  Performed by: Clearnce Sorrel, CRNAPre-anesthesia Checklist: Patient identified, Emergency Drugs available, Suction available and Patient being monitored Patient Re-evaluated:Patient Re-evaluated prior to induction Oxygen Delivery Method: Circle system utilized Preoxygenation: Pre-oxygenation with 100% oxygen Induction Type: IV induction, Rapid sequence and Cricoid Pressure applied Laryngoscope Size: Glidescope and 3 Grade View: Grade I Tube type: Oral Tube size: 7.0 mm Number of attempts: 1 Airway Equipment and Method: Stylet and Oral airway Placement Confirmation: ETT inserted through vocal cords under direct vision, positive ETCO2 and breath sounds checked- equal and bilateral Secured at: 21 cm Tube secured with: Tape Dental Injury: Teeth and Oropharynx as per pre-operative assessment  Comments: Grade 1 view on glidego screen

## 2022-09-24 NOTE — Progress Notes (Signed)
At 12:40 PM, patient up in room to vomit in garbage can. EKG was then obtained to check QT length per MD order for additonal nausea meds. While tech was at bedside performing EKG, she noted patient was not acting herself. This nurse was then called into the room, noticing patient was weak on her left side and mental status was altered. CBG was checked with result of 172. Rapid nurse and family medicine team were notified and to bedside. Code stroke was called at 1336 while patient was in CT.   09/24/22 1410  Vitals  BP (!) 139/106  MAP (mmHg) 115  BP Location Right Arm  BP Method Automatic  Patient Position (if appropriate) Lying  Pulse Rate Source Monitor  Resp 20  Level of Consciousness  Level of Consciousness Alert  MEWS COLOR  MEWS Score Color Green  Oxygen Therapy  O2 Device Room Air  MEWS Score  MEWS Temp 0  MEWS Systolic 0  MEWS Pulse 0  MEWS RR 0  MEWS LOC 0  MEWS Score 0

## 2022-09-24 NOTE — H&P (Signed)
NEUROLOGY H&P NOTE   Date of service: September 24, 2022 Patient Name: [name] MRN:  [id number] DOB:  August 25, 1975 CC: L sided weakness Requesting physician: Dr. [name] _ _ _   _ __   _ __ _ _  __ __   _ __   __ _  History of Present Illness   This is a 47 year old patient admitted with shortness of breath secondary to acute combined CHF exacerbation and also found to have an LV thrombus on heparin drip until this morning on whom neurology is consulted for stroke code for left-sided weakness.  Other acute medical problems include acute on chronic combined systolic and diastolic heart failure, elevated troponin (elevated to 197 but trended down to 170, no EKG changes, felt to be likely demand ischemia secondary to CHF exacerbation), poorly controlled diabetes type 2 with last A1c 10.8, paroxysmal A-fib noncompliant with warfarin as an outpatient, hypertension.  Last known well was 12:40 PM, after that time patient developed left-sided weakness and right gaze deviation.  Rapid and then stroke code were activated.  CT head showed loss of gray-white differentiation in the right insula likely representing early infarct with an aspects of 9.  Patient was on heparin drip until this morning for LV thrombus which was shut off at the time the stroke code was called.  Therefore she was not a candidate for TNK.  NIH stroke scale was 16.  Patient has no deficits at baseline.  CT angiogram showed a proximal right M2 occlusion.  Patient was disoriented and unable to provide consent.  Risks, benefits, and alternatives to mechanical thrombectomy were discussed with her father [name] by phone and he gave informed consent to proceed.  Patient is currently in IR.  ROS   Per HPI: all other systems reviewed and are negative  Past History   I have reviewed the following:  Past Medical History:  Diagnosis Date   Abnormal Pap smear    Acute combined systolic and diastolic heart failure ([place]) 02/19/2019    Wt Readings from Last 3 Encounters:  09/18/21  84.7 kg  09/02/21  84.4 kg  08/31/21  84.2 kg         Acute heart failure ([place]) 01/29/2022   Acute on chronic combined systolic and diastolic CHF (congestive heart failure) ([place]) 02/19/2019   Acute on chronic congestive heart failure (HCC)    Acute on chronic congestive heart failure (HCC)    Acute on chronic diastolic CHF (congestive heart failure) ([place]) 10/19/2021   Acute on chronic systolic heart failure ([place]) 02/22/2022   Anemia    Asthma    Atrial fibrillation ([place])    history of paroxysmal atrial fibrillation   CHF (congestive heart failure) ([place]) 11/02/2021   CHF exacerbation ([place]) 10/17/2021   Diabetes mellitus    DKA, type 2 ([place]) 02/06/2019   Elevated troponin 09/30/2019   Heart failure with reduced ejection fraction ([place])    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     Migraines    Morbid obesity ([place]) 02/06/2019   Nausea & vomiting 06/26/2022   Nonischemic cardiomyopathy ([place]) 09/22/2021  NSTEMI (non-ST elevated myocardial infarction) ([place]) 02/23/2022   Obstructive sleep apnea 06/30/2015   Polycystic ovarian disease    Past Surgical History:  Procedure Laterality Date   BIOPSY  11/05/2021   Procedure: BIOPSY;  Surgeon: Daryel November, MD;  Location[facility] ENDOSCOPY;  Service: Gastroenterology;;   CERVIX LESION DESTRUCTION     CESAREAN SECTION     ESOPHAGOGASTRODUODENOSCOPY (EGD) WITH PROPOFOL N/A 11/05/2021   Procedure: ESOPHAGOGASTRODUODENOSCOPY (EGD) WITH PROPOFOL;  Surgeon: Daryel November, MD;  Location: [place];  Service: Gastroenterology;   Laterality: N/A;   RIGHT/LEFT HEART CATH AND CORONARY ANGIOGRAPHY N/A 03/29/2019   Procedure: RIGHT/LEFT HEART CATH AND CORONARY ANGIOGRAPHY;  Surgeon: Larey Dresser, MD;  Location: [place] CV LAB;  Service: Cardiovascular;  Laterality: N/A;   RIGHT/LEFT HEART CATH AND CORONARY ANGIOGRAPHY N/A 09/20/2021   Procedure: RIGHT/LEFT HEART CATH AND CORONARY ANGIOGRAPHY;  Surgeon: Larey Dresser, MD;  Location: [place] CV LAB;  Service: Cardiovascular;  Laterality: N/A;   Family History  Problem Relation Age of Onset   Diabetes Mother    Hypertension Mother    Hypertension Father    Diabetes Father    Social History   Socioeconomic History   Marital status: Single    Spouse name: Not on file   Number of children: Not on file   Years of education: Not on file   Highest education level: Not on file  Occupational History   Not on file  Tobacco Use   Smoking status: Never   Smokeless tobacco: Never   Tobacco comments:    Never smoke 10/22/21  Vaping Use   Vaping Use: Never used  Substance and Sexual Activity   Alcohol use: No    Alcohol/week: 0.0 standard drinks of alcohol   Drug use: No   Sexual activity: Yes  Other Topics Concern   Not on file  Social History Narrative   Not on file   Social Determinants of Health   Financial Resource Strain: High Risk (10/17/2019)   Overall Financial Resource Strain (CARDIA)    Difficulty of Paying Living Expenses: Hard  Food Insecurity: No Food Insecurity (09/23/2022)   Hunger Vital Sign    Worried About Running Out of Food in the Last Year: Never true    Ran Out of Food in the Last Year: Never true  Transportation Needs: No Transportation Needs (09/23/2022)   PRAPARE - Hydrologist (Medical): No    Lack of Transportation (Non-Medical): No  Recent Concern: Transportation Needs - Unmet Transportation Needs (08/07/2022)   PRAPARE - Hydrologist (Medical): Yes    Lack of  Transportation (Non-Medical): No  Physical Activity: Insufficiently Active (08/19/2020)   Exercise Vital Sign    Days of Exercise per Week: 7 days    Minutes of Exercise per Session: 20 min  Stress: Stress Concern Present (08/19/2020)   [place]    Feeling of Stress : Very much  Social Connections: Not on file   Allergies  Allergen Reactions   Fish Allergy Swelling   Shellfish Allergy Anaphylaxis, Swelling and Other (See Comments)    Airway involvement including EMS - Has Epi-Pen   Metformin And Related Diarrhea   Trulicity [Dulaglutide] Nausea And Vomiting and Other (See Comments)    Multiple episodes of vomiting over multiple days   Nsaids Other (See Comments)    Per PCP interferes with daily meds (heart failure)  Advil [Ibuprofen] Other (See Comments)    Per PCP interferes with daily meds (heart failure)    Medications   Medications Prior to Admission  Medication Sig Dispense Refill Last Dose   acetaminophen (TYLENOL) 500 MG tablet Take 500-1,000 mg by mouth every 6 (six) hours as needed for mild pain or headache.   unknown   atorvastatin (LIPITOR) 80 MG tablet Take 1 tablet (80 mg total) by mouth daily. 90 tablet 3 09/22/2022   empagliflozin (JARDIANCE) 10 MG TABS tablet Take 1 tablet (10 mg total) by mouth daily. 30 tablet 0 09/22/2022   insulin aspart (NOVOLOG FLEXPEN) 100 UNIT/ML FlexPen Inject 12 Units into the skin daily before supper. 15 mL 0 09/22/2022   insulin glargine (LANTUS) 100 UNIT/ML Solostar Pen Inject 15 Units into the skin daily. 15 mL 0 09/22/2022   losartan (COZAAR) 50 MG tablet Take 1 tablet (50 mg total) by mouth daily. 30 tablet 0 09/22/2022   warfarin (COUMADIN) 7.5 MG tablet Take 1-2 tablets (7.5-15 mg total) by mouth daily as directed by [facility] (Patient taking differently: Take 7.5-11.25 mg by mouth as directed. Take 1 tablet (7.5 mg) tablet by mouth every Monday & Take 1.5 tablets  (11.25 mg) all other days) 40 tablet 0 09/22/2022 at 1530   Accu-Chek Softclix Lancets lancets Use as instructed 100 each 12    Accu-Chek Softclix Lancets lancets Use up 4 (four) times daily as directed 100 each 0    blood glucose meter kit and supplies Use up to 4 (four) times daily as directed 1 each 0    ferrous sulfate 325 (65 FE) MG tablet Take 1 tablet (325 mg total) by mouth daily with breakfast. (Patient not taking: Reported on 09/23/2022) 30 tablet 0 Completed Course   glucose blood (ONETOUCH VERIO) test strip Use up to 4 (four) times daily as directed 100 each 0    hydrOXYzine (ATARAX) 10 MG tablet Take 1 tablet (10 mg total) by mouth 3 (three) times daily as needed. (Patient taking differently: Take 10 mg by mouth 3 (three) times daily as needed for anxiety.) 90 tablet 3 unknown   Insulin Pen Needle (UNIFINE PENTIPS) 31G X 5 MM MISC USE AS DIRECTED 2 TIMES DAILY 100 each 11    metoCLOPramide (REGLAN) 5 MG tablet Take 1 tablet (5 mg total) by mouth 4 (four) times daily -  before meals and at bedtime. (Patient not taking: Reported on 09/23/2022) 60 tablet 0 Completed Course   metoprolol succinate (TOPROL-XL) 25 MG 24 hr tablet Take 1 tablet (25 mg total) by mouth daily. 30 tablet 0    polyethylene glycol (MIRALAX / GLYCOLAX) 17 g packet Take 17 g by mouth daily as needed for mild constipation. 14 each 0    prochlorperazine (COMPAZINE) 5 MG tablet Take 1 tablet (5 mg total) by mouth every 6 (six) hours as needed for refractory nausea / vomiting. 30 tablet 0    senna-docusate (SENOKOT-S) 8.6-50 MG tablet Take 1 tablet by mouth at bedtime as needed for moderate constipation. 30 tablet 0    sertraline (ZOLOFT) 50 MG tablet Take 50 mg by mouth daily.      spironolactone (ALDACTONE) 25 MG tablet Take 25 mg by mouth daily. Take 1 Tablet Daily      Torsemide 40 MG TABS Take 40 mg by mouth 2 (two) times daily. 180 tablet 3    traZODone (DESYREL) 50 MG tablet Take 1 tablet (50 mg total) by mouth at  bedtime. 30 tablet 0  Current Facility-Administered Medications:    [START ON 09/25/2022]  stroke: early stages of recovery book, , Does not apply, Once, Derek Jack, MD   acetaminophen (TYLENOL) tablet 650 mg, 650 mg, Oral, Q6H PRN, Alcus Dad, MD, 650 mg at 09/24/22 0945   atorvastatin (LIPITOR) tablet 80 mg, 80 mg, Oral, QHS, Wells, Ashleigh, MD, 80 mg at 09/23/22 2117   ceFAZolin (ANCEF) 2-4 GM/100ML-% IVPB, , , ,    empagliflozin (JARDIANCE) tablet 10 mg, 10 mg, Oral, Daily, Alcus Dad, MD, 10 mg at 09/24/22 0818   furosemide (LASIX) injection 80 mg, 80 mg, Intravenous, BID, Skeet Latch, MD, 80 mg at 09/24/22 0815   insulin aspart (novoLOG) injection 0-15 Units, 0-15 Units, Subcutaneous, TID WC, Alcus Dad, MD, 3 Units at 09/24/22 1227   insulin glargine-yfgn (SEMGLEE) injection 15 Units, 15 Units, Subcutaneous, Daily, Alcus Dad, MD, 15 Units at 09/24/22 0819   iohexol (OMNIPAQUE) 300 MG/ML solution 150 mL, 150 mL, Intra-arterial, Once PRN, Deveshwar, Sanjeev, MD   iohexol (OMNIPAQUE) 300 MG/ML solution 50 mL, 50 mL, Intra-arterial, Once PRN, Deveshwar, Willaim Rayas, MD   losartan (COZAAR) tablet 50 mg, 50 mg, Oral, Daily, Rock Nephew, Ashleigh, MD, 50 mg at 09/24/22 1051   metoCLOPramide (REGLAN) tablet 5 mg, 5 mg, Oral, TID AC & HS, Wells, Ashleigh, MD, 5 mg at 09/24/22 1051   metoprolol succinate (TOPROL-XL) 24 hr tablet 25 mg, 25 mg, Oral, Daily, Ganta, Anupa, DO, 25 mg at 09/24/22 0818   nitroGLYCERIN 100 mcg/mL intra-arterial injection, , , ,    nitroGLYCERIN 25 mcg in sodium chloride (PF) 0.9 % 59.71 mL (25 mcg/mL) syringe, , , PRN, Deveshwar, Sanjeev, MD, 25 mcg at 09/24/22 1544   sertraline (ZOLOFT) tablet 50 mg, 50 mg, Oral, Daily, Wells, Ashleigh, MD, 50 mg at 09/24/22 0818   spironolactone (ALDACTONE) tablet 25 mg, 25 mg, Oral, Daily, Wells, Ashleigh, MD, 25 mg at 09/24/22 0818   traZODone (DESYREL) tablet 50 mg, 50 mg, Oral, QHS, Wells, Ashleigh, MD, 50  mg at 09/23/22 2117  Facility-Administered Medications Ordered in Other Encounters:    0.9 %  sodium chloride infusion, , Intravenous, Continuous PRN, Clearnce Sorrel, CRNA, New Bag at 09/24/22 1428   ceFAZolin (ANCEF) IVPB 2 g/50 mL premix, , Intravenous, Anesthesia Intra-op, Clearnce Sorrel, CRNA, 2 g at 09/24/22 1515   dexamethasone (DECADRON) injection, , Intravenous, Anesthesia Intra-op, Clearnce Sorrel, CRNA, 4 mg at 09/24/22 1511   ePHEDrine sulfate (PF) '5mg'$ /mL syringe, , Intravenous, Anesthesia Intra-op, Clearnce Sorrel, CRNA, 15 mg at 09/24/22 1459   EPINEPHrine (ADRENALIN) 1 MG/10ML injection, , Intravenous, Anesthesia Intra-op, Clearnce Sorrel, CRNA, 95 mcg at 09/24/22 1502   etomidate (AMIDATE) injection, , Intravenous, Anesthesia Intra-op, Clearnce Sorrel, CRNA, 10 mg at 09/24/22 1445   fentaNYL (SUBLIMAZE) injection, , Intravenous, Anesthesia Intra-op, Clearnce Sorrel, CRNA, 100 mcg at 09/24/22 1443   lidocaine 2% (20 mg/mL) 5 mL syringe, , Intravenous, Anesthesia Intra-op, Clearnce Sorrel, CRNA, 60 mg at 09/24/22 1445   phenylephrine (NEO-SYNEPHRINE) '20mg'$ /NS 258m premix infusion, , Intravenous, Continuous PRN, JClearnce Sorrel CRNA, Last Rate: 48.75 mL/hr at 09/24/22 1548, 65 mcg/min at 09/24/22 1548   rocuronium bromide 10 mg/mL (PF) syringe, , Intravenous, Anesthesia Intra-op, JClearnce Sorrel CRNA, 100 mg at 09/24/22 1514   succinylcholine (ANECTINE) syringe, , Intravenous, Anesthesia Intra-op, JClearnce Sorrel CRNA, 80 mg at 09/24/22 1445   vasopressin (PITRESSIN) 20 UNIT/ML injection, , Intravenous, Anesthesia Intra-op, JClearnce Sorrel CRNA, 5 Units at 09/24/22 1459  Vitals   Vitals:   09/24/22 1054 09/24/22 1258  09/24/22 1400 09/24/22 1410  BP: 112/83 (!) 127/90 (!) 121/106 (!) 139/106  Pulse: 83 95 87   Resp:    20  Temp:      TempSrc:      SpO2:  99% 99%   Weight:      Height:         Body mass index is 39.75 kg/m.  Physical Exam   Physical Exam Gen: Alert and oriented x 3 but becomes  confused in conversation  HEENT: Atraumatic, normocephalic;mucous membranes moist; oropharynx clear, tongue without atrophy or fasciculations. Neck: Supple, trachea midline. Resp: CTAB, no w/r/r CV: RRR, no m/g/r; nml S1 and S2. 2+ symmetric peripheral pulses. Abd: soft/NT/ND; nabs x 4 quad Extrem: Nml bulk; no cyanosis, clubbing, or edema.  Neuro: *MS: Alert and oriented x 3 but becomes confused in conversation, follows simple commands *Speech: Mild dysarthria, no aphasia *CN:    I: Deferred   II,III: PERRLA, no blink to threat on left, optic discs unable to be visualized 2/2 pupillary constriction   III,IV,VI: Right gaze deviation resolved over the course of the stroke code, no ptosis   V: Sensation intact from V1 to V3 to LT   VII: Eyelid closure was full.  Left upper motor neuron facial droop VIII: Hearing intact to voice   IX,X: Voice normal, palate elevates symmetrically    XI: SCM/trap 5/5 bilat   XII: Tongue protrudes midline, no atrophy or fasciculations  *Motor:   Normal bulk.  No tremor, rigidity or bradykinesia.  No movement left arm, no movement against gravity left leg, right arm and leg full strength *Sensory: Impaired to light touch on the left.  Visual and hemisensory neglect on the left. *Coordination: Finger-nose-finger intact on right *Reflexes: 1+ symmetric throughout, toes mute bilaterally *Gait: Deferred  NIHSS  1a Level of Conscious.: 0 1b LOC Questions: 0 1c LOC Commands: 0 2 Best Gaze: 2 3 Visual: 2 4 Facial Palsy: 1 5a Motor Arm - left: 4 5b Motor Arm - Right: 0 6a Motor Leg - Left: 3 6b Motor Leg - Right: 0 7 Limb Ataxia: 0 8 Sensory: 1 9 Best Language: 0 10 Dysarthria: 1 11 Extinct. and Inatten.: 2  TOTAL: 16   Premorbid mRS = 0   Labs   CBC:  Recent Labs  Lab 09/22/22 2050 09/23/22 0514 09/24/22 0130  WBC 8.7 7.5 6.7  NEUTROABS 6.0  --   --   HGB 13.0 12.4 12.3  HCT 41.3 39.6 39.4  MCV 71.1* 71.4* 71.0*  PLT 232 228 213     Basic Metabolic Panel:  Lab Results  Component Value Date   NA 133 (L) 09/24/2022   K 3.3 (L) 09/24/2022   CO2 28 09/24/2022   GLUCOSE 98 09/24/2022   BUN 12 09/24/2022   CREATININE 1.04 (H) 09/24/2022   CALCIUM 8.4 (L) 09/24/2022   GFRNONAA >60 09/24/2022   GFRAA >60 04/03/2020   Lipid Panel:  Lab Results  Component Value Date   LDLCALC 97 09/24/2022   HgbA1c:  Lab Results  Component Value Date   HGBA1C 10.8 (H) 09/23/2022   Urine Drug Screen:     Component Value Date/Time   LABOPIA NONE DETECTED 10/18/2021 1519   COCAINSCRNUR NONE DETECTED 10/18/2021 1519   LABBENZ NONE DETECTED 10/18/2021 1519   AMPHETMU NONE DETECTED 10/18/2021 1519   THCU NONE DETECTED 10/18/2021 1519   LABBARB NONE DETECTED 10/18/2021 1519    Alcohol Level No results found for: "ETH"  CT head wo  CTA H&N  1. Loss of gray-white differentiation in the insula likely reflecting early infarct. Aspects is 9. 2. Proximal right M2 occlusion with minimal collateral flow throughout the MCA distribution. 3. Patent vasculature of the neck with no hemodynamically significant stenosis or occlusion.  CNS imaging personally reviewed; I agree with above interpretation  Impression   This is a 47 year old patient admitted with shortness of breath secondary to acute combined CHF exacerbation and also found to have an LV thrombus on heparin drip until this morning on whom neurology is consulted for stroke code for left-sided weakness.  Other acute medical problems include acute on chronic combined systolic and diastolic heart failure, elevated troponin (elevated to 197 but trended down to 170, no EKG changes, felt to be likely demand ischemia secondary to CHF exacerbation), poorly controlled diabetes type 2 with last A1c 10.8, paroxysmal A-fib noncompliant with warfarin as an outpatient, hypertension.  LKW 1240 after she developed left-sided weakness and right gaze deviation.  CT head showed loss of gray-white  differentiation in the right insula likely reflecting early infarct.  Patient was not a TNK candidate due to being on heparin drip until just prior to the stroke code. CT angiogram showed a proximal right M2 occlusion.  Patient was disoriented and unable to provide consent.  Risks, benefits, and alternatives to mechanical thrombectomy were discussed with her father [name] by phone and he gave informed consent to proceed.  Patient is currently in IR.  Recommendations   # Acute ischemic stroke 2/2 R M2 occlusion, likely embolic from known LV thrombus - Admit to Neuro ICU post-procedure under Dr. Quinn Axe - BP parameters per IR, clevidipine gtt prn - MRI brain wo contrast - TTE - Check A1c and LDL + add statin per guidelines - Hold anticoagulation and antiplatelets until postprocedure imaging shows no intracranial hemorrhage.  Will discuss with IR when we are able to restart the heparin drip for LV thrombus. - q4 hr neuro checks - STAT head CT for any change in neuro exam - Tele - PT/OT/SLP when able to participate - Stroke education  # Acute on chronic combined systolic (congestive) and diastolic (congestive) heart failure (HCC) Improved, respiratory status stable. UOP 3.25L. ECHO pending.  -Cardiology consulted, appreciate recommendations -Lasix '80mg'$  IV BID -Continue home spironolactone '25mg'$  daily  -Home Metoprolol on hold for permissive hypertension -strict I/Os, daily weights -daily BMP, Mg   Elevated troponin Cardiology agrees with most likely demand ischemia and now trending down. -Cardiology consulted, appreciate recommendations   Poorly controlled type 2 diabetes mellitus ([place]) A1c pending. Glucose stable (132, 98, 132). Last A1c >15.5 (06/02/22) -Check updated A1c -CBG monitoring q 6 hrs - Semglee, jardiance on hold while npo -mSSI   Paroxysmal atrial fibrillation (HCC) In sinus rhythm currently -tele monitoring -Anticoagulation on hold pending outcome of IR procedure  per above  # LV thrombus -Anticoagulation on hold pending outcome of IR procedure per above; will restart heparin drip when able   Chronic nausea Likely secondary to gastroparesis although this has not been formally diagnosed. Borderline prolonged QT on EKG. -Continue home Reglan when taking po -Daily EKG   Hypokalemia K 3.3 today. S/p 47mq PO repletion. Mag 1.6 today s/p 2g. -check BMP, Mg AM -replete as appropriate. Goal K>4, Mg>2   Hypertension associated with diabetes (HCC) BP elevated on admission -Hold home losartan for permissive hypertension    FEN/GI: NPO VTE Prophylaxis: Will restart heparin drip if no complications postprocedure  dispo: Pending clinical improvement  CCM consulted  Stroke team to assume care tomorrow  This patient is critically ill and at significant risk of neurological worsening, death and care requires constant monitoring of vital signs, hemodynamics,respiratory and cardiac monitoring, neurological assessment, discussion with family, other specialists and medical decision making of high complexity. I spent 100 minutes of neurocritical care time in the care of  this patient. This was time spent independent of any time provided by nurse practitioner or PA.  Tammy Monks, MD [facility] [phone]  If 7pm- 7am, please page neurology on call as listed in [place].  Addendum 1753: Patient coded in IR. ROSC was obtained. She underwent attempted mechanical thrombectomy which was ultimately unsuccessful after 5 passes. Patient admitted to 4N neuro ICU. I have been unable to reach her father or daughter by phone since the procedure. Patient said prior to procedure she wanted her boyfriend updated so I did call him and update him by phone. He will continue to try to get in touch with her daughter.  Tammy Monks, MD [facility] [phone]  If 7pm- 7am, please page neurology on call as listed in [place].

## 2022-09-24 NOTE — Progress Notes (Signed)
Rounding Note    Patient Name: [name] Date of Encounter: 09/24/2022  [place] Cardiologist: None   Subjective   BP 112/83.  Net -2.4 L yesterday.  Creatinine stable at 1.0.  Reports she continues to feel short of breath  Inpatient Medications    Scheduled Meds:  atorvastatin  80 mg Oral QHS   empagliflozin  10 mg Oral Daily   furosemide  80 mg Intravenous BID   insulin aspart  0-15 Units Subcutaneous TID WC   insulin glargine-yfgn  15 Units Subcutaneous Daily   losartan  50 mg Oral Daily   metoCLOPramide  5 mg Oral TID AC & HS   metoprolol succinate  25 mg Oral Daily   sertraline  50 mg Oral Daily   spironolactone  25 mg Oral Daily   traZODone  50 mg Oral QHS   Continuous Infusions:  heparin 1,050 Units/hr (09/23/22 2223)   PRN Meds: acetaminophen   Vital Signs    Vitals:   09/24/22 0529 09/24/22 0749 09/24/22 0816 09/24/22 1054  BP: (!) 104/91  110/82 112/83  Pulse: 80   83  Resp: 20 19    Temp: 97.6 F (36.4 C) (!) 97.5 F (36.4 C)    TempSrc: Oral Oral    SpO2: 95%     Weight:      Height:        Intake/Output Summary (Last 24 hours) at 09/24/2022 1139 Last data filed at 09/24/2022 1052 Gross per 24 hour  Intake 1091.64 ml  Output 3850 ml  Net -2758.36 ml      09/24/2022    5:00 AM 09/22/2022    7:41 PM 09/08/2022   11:19 AM  Last 3 Weights  Weight (lbs) 196 lb 12.8 oz 193 lb 12.6 oz 193 lb 12.8 oz  Weight (kg) 89.268 kg 87.9 kg 87.907 kg      Telemetry    Normal sinus rhythm, NSVT x 5 beats- Personally Reviewed  ECG    No new ECG- Personally Reviewed  Physical Exam   GEN: No acute distress.   Neck: + JVD Cardiac: RRR, no murmurs, rubs, or gallops.  Respiratory: Diminished breath sounds GI: Soft, nontender, non-distended  MS: 2+ edema Neuro:  Nonfocal  Psych: Normal affect   Labs    High Sensitivity Troponin:   Recent Labs  Lab 09/22/22 2050 09/22/22 2134  TROPONINIHS 197* 170*     Chemistry Recent Labs   Lab 09/22/22 2050 09/22/22 2103 09/23/22 1218 09/24/22 0130  NA 133*  --  133* 133*  K 3.2*  --  3.6 3.3*  CL 95*  --  96* 96*  CO2 26  --  22 28  GLUCOSE 245*  --  267* 98  BUN 15  --  14 12  CREATININE 1.01* 1.00 0.98 1.04*  CALCIUM 8.1*  --  8.1* 8.4*  MG  --   --  1.3* 1.6*  GFRNONAA >60  --  >60 >60  ANIONGAP 12  --  15 9    Lipids  Recent Labs  Lab 09/24/22 0130  CHOL 136  TRIG 62  HDL 27*  LDLCALC 97  CHOLHDL 5.0    Hematology Recent Labs  Lab 09/22/22 2050 09/23/22 0514 09/24/22 0130  WBC 8.7 7.5 6.7  RBC 5.81* 5.55* 5.55*  HGB 13.0 12.4 12.3  HCT 41.3 39.6 39.4  MCV 71.1* 71.4* 71.0*  MCH 22.4* 22.3* 22.2*  MCHC 31.5 31.3 31.2  RDW 24.5* 24.8* 23.9*  PLT 232 228 213   Thyroid  Recent Labs  Lab 09/24/22 0130  TSH 3.269    BNP Recent Labs  Lab 09/22/22 2050  BNP 688.8*    DDimer No results for input(s): "DDIMER" in the last 168 hours.   Radiology    CT Angio Chest PE W and/or Wo Contrast  Result Date: 09/22/2022 CLINICAL DATA:  Pulmonary embolism suspected, high probability. Bilateral lower extremity edema and shortness of breath. EXAM: CT ANGIOGRAPHY CHEST WITH CONTRAST TECHNIQUE: Multidetector CT imaging of the chest was performed using the standard protocol during bolus administration of intravenous contrast. Multiplanar CT image reconstructions and MIPs were obtained to evaluate the vascular anatomy. RADIATION DOSE REDUCTION: This exam was performed according to the departmental dose-optimization program which includes automated exposure control, adjustment of the mA and/or kV according to patient size and/or use of iterative reconstruction technique. CONTRAST:  152m OMNIPAQUE IOHEXOL 350 MG/ML SOLN COMPARISON:  05/06/2022. FINDINGS: Cardiovascular: The heart is enlarged and there is no pericardial effusion. Scattered coronary artery calcifications are noted. There is atherosclerotic calcification of the aorta without evidence of aneurysm.  The pulmonary trunk is normal in caliber. No pulmonary artery filling defect is seen. Mediastinum/Nodes: No mediastinal, hilar, or axillary lymphadenopathy by size criteria. The thyroid gland, trachea, and esophagus are within normal limits. Lungs/Pleura: There is a small right pleural effusion. Mild central bronchial wall thickening is noted bilaterally. Atelectasis is noted bilaterally. No pneumothorax. Upper Abdomen: No acute abnormality. Musculoskeletal: Anasarca is noted. Degenerative changes are present in the thoracic spine. No acute osseous abnormality. Review of the MIP images confirms the above findings. IMPRESSION: 1. No evidence of pulmonary embolism. 2. Mild central bronchial wall thickening bilaterally which may be infectious or inflammatory. 3. Small right pleural effusion. 4. Cardiomegaly with coronary artery calcifications. 5. Aortic atherosclerosis. 6. Anasarca. Electronically Signed   By: LBrett FairyM.D.   On: 09/22/2022 23:25   DG Chest 2 View  Result Date: 09/22/2022 CLINICAL DATA:  Shortness of breath, heart failure, extremity swelling. EXAM: CHEST - 2 VIEW COMPARISON:  08/13/2022. FINDINGS: Heart is enlarged and the mediastinal contour is within normal limits. The pulmonary vasculature is mildly distended. Subsegmental atelectasis is noted in the mid left lung. No effusion or pneumothorax. No acute osseous abnormality. IMPRESSION: Cardiomegaly with mild pulmonary vascular congestion. Electronically Signed   By: LBrett FairyM.D.   On: 09/22/2022 20:29    Cardiac Studies     Patient Profile     47y.o. 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 There was no evidence of prior MI, infiltrative disease, myocarditis.  - R/L heart catheterization from 08/2021 showed mild, nonobstructive CAD - Patient has been admitted several times in the past for CHF exacerbation/fluid overload. Often in the setting of medication noncompliance or failure to follow low sodium diet  - Now, patient presented complaining of lower extremity edema, shortness of breath  - BNP  elevated to 688. CXR with cardiomegaly and mild pulmonary vascular congestion  - Continue IV lasix - Continue losartan 50 mg daily, Toprol-XL 25 mg daily, spironolactone 25 mg daily, jardiance 10 mg daily. Patient has not tolerated entresto in the past due to hypotension  - Strict I/Os, daily weights, daily BMPs  - F/u echo - Maintain K greater than 4, mag greater than 2   Elevated Troponin  - hsTn 197>170  - Suspect this is demand ischemia in the setting of CHF  - Patient does complain of chest pain, but symptoms are very vague. Troponin trend is not consistent with ACS and patient had clean coronaries on cath from 08/2021. No plans for ischemic evaluation at this time    LV thrombus  Subtherapeutic INR on coumadin  - Echocardiogram from 02/2022 showed an LV thrombus present at apex measuring 2.2 x 1.4 with some mobility  - Patient was on Xarelto at one point, but has more recently been on coumadin  - INR subtherapeutic at 1.3  -Given her noncompliance with Coumadin clinic appointments, would recommend switching from Coumadin to Eliquis   Paroxysmal Atrial Fibrillation  - Patient is in sinus tachycardia with HR 100-110 - Resume home metoprolol succinate 25 mg daily as above  - Patient  was on coumadin, INR subtherapeutic at 1.3. Currently on IV heparin.    Mild Mitral Valve regurgitation  Mild-moderate tricuspid valve regurgitation  - Echocardiogram this admission pending   Otherwise per primary  - Type 2 DM, poorly controlled  - Chronic nausea    For questions or updates, please contact [place] Please consult [email or web address] for contact info under        Signed, Donato Heinz, MD  09/24/2022, 11:39 AM

## 2022-09-24 NOTE — Progress Notes (Addendum)
[facility] Progress Note Patient Name: SIVAN EVERHART DOB: 03/02/1976 MRN: [id number]   Date of Service  09/24/2022  HPI/Events of Note  K was 3.3 and mag 1.6 this AM. Not replaced. Qtc is 530. Also asked to place order for Kub earlier for OG tube .   eICU Interventions  Recheck labs now Start magnesium repletion  Let me know when labs result OG looks ok on imaging     Intervention Category Major Interventions: Electrolyte abnormality - evaluation and management  Camara Renstrom G Jaydyn Menon 09/24/2022, 10:22 PM  Addendum at 11:40 pm - reviewed labs. Nothing else to add.

## 2022-09-24 NOTE — Discharge Instructions (Signed)

## 2022-09-24 NOTE — Transfer of Care (Signed)
Immediate Anesthesia Transfer of Care Note  Patient: [name]  Procedure(s) Performed: IR WITH ANESTHESIA  Patient Location: PACU  Anesthesia Type:General  Level of Consciousness: Patient remains intubated per anesthesia plan  Airway & Oxygen Therapy: Patient remains intubated per anesthesia plan  Post-op Assessment: Report given to RN and Post -op Vital signs reviewed and stable  Post vital signs: Reviewed and stable  Last Vitals:  Vitals Value Taken Time  BP    Temp    Pulse    Resp 20 09/24/22 1716  SpO2 99 % 09/24/22 1710  Vitals shown include unvalidated device data.  Last Pain:  Vitals:   09/24/22 0816  TempSrc:   PainSc: 0-No pain         Complications: No notable events documented.

## 2022-09-24 NOTE — Anesthesia Procedure Notes (Signed)
Arterial Line Insertion Start/End3/08/2022 2:50 PM, 09/24/2022 2:55 PM Performed by: Murvin Natal, MD, anesthesiologist  Patient location: OOR procedure area. Preanesthetic checklist: patient identified, IV checked, site marked, risks and benefits discussed, surgical consent, monitors and equipment checked, pre-op evaluation, timeout performed and anesthesia consent Left, radial was placed Catheter size: 20 G Hand hygiene performed , maximum sterile barriers used  and Seldinger technique used  Attempts: 1 Procedure performed using ultrasound guided technique. Ultrasound Notes:anatomy identified, needle tip was noted to be adjacent to the nerve/plexus identified and no ultrasound evidence of intravascular and/or intraneural injection Following insertion, dressing applied and Biopatch. Post procedure assessment: normal and unchanged  Patient tolerated the procedure well with no immediate complications.

## 2022-09-24 NOTE — Progress Notes (Signed)
Qtc interval measured 530 ms on 2000 strip. Neuro MD and CCM notified. Ordred for STAT labs and IVPB Mg ordered.   [place]

## 2022-09-24 NOTE — Evaluation (Signed)
Occupational Therapy Evaluation Patient Details Name: [name] MRN: [id number] DOB: 08/29/1975 Today's Date: 09/24/2022   History of Present Illness Pt is a 47 y.o. female admitted 2/29 for SOB related to acute on chronic CHF. PMH: CAD, DM2, HTN, a fib, asthma, anxiety, depression   Clinical Impression   Pt. Was seen for OT to assess for needs. Pt. Is complaining of weakness and does require extra time to complete tasks. Pt. Is S/setup with adls and adl transfers. Pt. Was ed on energy conservation and was given handout. Pt. Does not require further acute OT.      Recommendations for follow up therapy are one component of a multi-disciplinary discharge planning process, led by the attending physician.  Recommendations may be updated based on patient status, additional functional criteria and insurance authorization.   Follow Up Recommendations  No OT follow up     Assistance Recommended at Discharge PRN  Patient can return home with the following Assistance with cooking/housework    Functional Status Assessment  Patient has had a recent decline in their functional status and demonstrates the ability to make significant improvements in function in a reasonable and predictable amount of time.  Equipment Recommendations   (ed pt. on shower seat vs tub transfer bench and pt is thinking about purchasing.)    Recommendations for Other Services       Precautions / Restrictions Precautions Precautions: Other (comment) Precaution Comments: watch vitals      Mobility Bed Mobility Overal bed mobility: Modified Independent                  Transfers Overall transfer level: Needs assistance Equipment used: None Transfers: Sit to/from Stand Sit to Stand: Supervision           General transfer comment: increased time for sit to stand and to amb in room.      Balance                                           ADL either performed or assessed with  clinical judgement   ADL Overall ADL's : Needs assistance/impaired Eating/Feeding: Independent   Grooming: Wash/dry hands;Wash/dry face;Supervision/safety;Standing   Upper Body Bathing: Supervision/ safety;Set up   Lower Body Bathing: Supervison/ safety;Set up   Upper Body Dressing : Set up   Lower Body Dressing: Set up   Toilet Transfer: Supervision/safety;Ambulation;Comfort height toilet   Toileting- Clothing Manipulation and Hygiene: Supervision/safety       Functional mobility during ADLs: Supervision/safety General ADL Comments: sat eob for adls without lob     Vision Baseline Vision/History: 1 Wears glasses Ability to See in Adequate Light: 0 Adequate Patient Visual Report: No change from baseline Vision Assessment?: No apparent visual deficits     Perception     Praxis      Pertinent Vitals/Pain Pain Assessment Pain Assessment: 0-10 Pain Score: 9  Pain Location: all over Pain Descriptors / Indicators: Tender Pain Intervention(s): Premedicated before session, Limited activity within patient's tolerance     Hand Dominance Right   Extremity/Trunk Assessment Upper Extremity Assessment Upper Extremity Assessment: Generalized weakness   Lower Extremity Assessment Lower Extremity Assessment: Defer to PT evaluation   Cervical / Trunk Assessment Cervical / Trunk Assessment: Normal   Communication Communication Communication: No difficulties   Cognition Arousal/Alertness: Awake/alert Behavior During Therapy: Flat affect Overall Cognitive Status: Within Functional Limits for  tasks assessed                                       General Comments       Exercises     Shoulder Instructions      Home Living Family/patient expects to be discharged to:: Private residence Living Arrangements: Alone Available Help at Discharge: Family;Friend(s);Available PRN/intermittently Type of Home: House Home Access: Stairs to enter Entrance  Stairs-Number of Steps: 1   Home Layout: Two level;1/2 bath on main level;Bed/bath upstairs Alternate Level Stairs-Number of Steps: flight Alternate Level Stairs-Rails: Right Bathroom Shower/Tub: Teacher, early years/pre: Standard Bathroom Accessibility: Yes   Home Equipment: Hand held shower head   Additional Comments: in the process of getting a handicapped accessible apartment      Prior Functioning/Environment Prior Level of Function : Independent/Modified Independent;Driving;Working/employed             Mobility Comments: no AD, works as a Research officer, 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 Her boyfriend has been driving her to/from work. ADLs Comments: mod I        OT Problem List: Decreased strength;Decreased activity tolerance      OT Treatment/Interventions:      OT Goals(Current goals can be found in the care plan section) Acute Rehab OT Goals Patient Stated Goal: to go home. OT Goal Formulation: With patient  OT Frequency:      Co-evaluation              AM-PAC OT "6 Clicks" Daily Activity     Outcome Measure Help from another person eating meals?: None Help from another person taking care of personal grooming?: A Little Help from another person toileting, which includes using toliet, bedpan, or urinal?: A Little Help from another person bathing (including washing, rinsing, drying)?: A Little Help from another person to put on and taking off regular upper body clothing?: A Little Help from another person to put on and taking off regular lower body clothing?: A Little 6 Click Score: 19   End of Session Nurse Communication:  (ok therapy)  Activity Tolerance: Patient tolerated treatment well Patient left: in bed;with call bell/phone within reach  OT Visit Diagnosis: Unsteadiness on feet (R26.81)                Time: [id number] OT Time Calculation (min): 28 min Charges:  OT General Charges $OT Visit: 1 Visit OT Evaluation $OT Eval Moderate  Complexity: 1 Mod  Zahari Fazzino OT/L   [place] 09/24/2022, 11:31 AM

## 2022-09-24 NOTE — Procedures (Signed)
INR  Status post right common carotid arteriogram.  Right CFA approach.  Findings.  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  Status post 2 passes with contact aspiration, 2 passes with 3 mm x 20 mm Solitaire X retrieval device  with  contact aspiration and 1 pass with a 4 mm x 40 mm solitary X retrieval device with contact aspiration with no change in the TICI 2B revascularization.  Post CT of the brain demonstrates no evidence of intracranial hemorrhage.  8 French Angio-Seal closure device deployed for hemostasis at the right groin puncture site.  Distal pulses dopplerable in  both feet unchanged.  Patient left intubated due to her medical comorbidities as per anesthesia.  Arlean Hopping MD

## 2022-09-24 NOTE — Progress Notes (Signed)
FMTS Interim Progress Note  S: Paged to bedside for acute change in mental status. Rapid response at bedside. Patient having slurred words, and reported left side weakness. Last known normal around 12:45.   O: BP 112/83   Pulse 83   Temp (!) 97.5 F (36.4 C) (Oral)   Resp 19   Ht '4\' 11"'$  (1.499 m)   Wt 89.3 kg   LMP 06/22/2022 Comment: patient states no chance of pregnancy  SpO2 95%   BMI 39.75 kg/m   General: NAD, ill-appearing Neuro: alert, pupils equal round and reactive to light, responding to questions appropriately, speech slightly slurred, left lower facial droop with smiling, patient able to lift bilateral upper and lower extremities against gravity but not remain held against gravity,  Cardiovascular: RRR on monitor, peripheral edema Respiratory: normal WOB on RA Abdomen: soft, NTTP, no rebound or guarding Extremities: Moving all 4 extremities equally   A/P: Acute mental status change Patient has stable vital signs with neurologic exam concerning for acute stroke. Patient has pertinent history of chronic LV thrombus and PAF for which she previously on Warfarin for but was subtherapeutic with recent INR of 1.3. On admission for current heart failure exacerbation she was started on heparin gtt.  -Code Stroke called -Stat CT Head wo contrast -Heparin drip held pending CT Head results -Original plan to transition to Eliquis today, dc'ed pending CT Head and Neuro evaluation  Salvadore Oxford, MD 09/24/2022, 1:58 PM PGY-1, [facility] Service pager [phone]

## 2022-09-24 NOTE — Code Documentation (Signed)
Stroke Response Nurse Documentation  Inpatient Code StrokeDocumentation  [name] is a 47 y.o. female with past medical hx of CHF, AF, DM, HTN, anemia, LV thombus. On heparin IV.   Patient from 3E13 where she was LKW at 1240 and now complaining of confusion and L sided weakness . Per primary RN, pt ambulating in room when became nauseous and vomited in trash can. Pt back to bed for EKG when noted to be altered from baseline mentation along with L sided weakness. CBG 172  Rapid Response and Primary team paged and to bedside. Pt lethargic but arousable to voice/light touch. Notable L sided weakness but was able to lift L arm against gravity and withdraw from painful stimuli.   STAT Head CT ordered. Initial NIHSS 12. Pt arrived in CT at 1325 , see documentation for details and code stroke times. Patient with decreased LOC, left hemianopia, left arm weakness, left leg weakness, and left decreased sensation and left sided neglect on exam. The following imaging was completed:  CT Head and CTA. Patient is not a candidate for IV Thrombolytic due to Heparin gtt. Patient is a candidate for IR due to LVO+ (Proximal Right M2 occlusion).   IR notified, telephone consent obtained by pt father Verneice Rexroad. Pt arrived in [place] at 1408. IR team arrived at 1425.      Sherilyn Dacosta  Stroke Response RN

## 2022-09-24 NOTE — Progress Notes (Signed)
Pt transported from IR to 4N 20 on the ventilator without incident.

## 2022-09-24 NOTE — Evaluation (Signed)
Physical Therapy Evaluation Patient Details Name: [name] MRN: [id number] DOB: 1975-12-04 Today's Date: 09/24/2022  History of Present Illness  Pt is a 47 y.o. female admitted 2/29 for SOB related to acute on chronic CHF. PMH: CAD, DM2, HTN, a fib, asthma, anxiety, depression   Clinical Impression  Pt admitted with above diagnosis. PTA pt lived at home alone, independent, working and driving.  Pt currently with functional limitations due to the deficits listed below (see PT Problem List). On eval, pt required supervision transfers, and min guard assist amb 30' without AD. Mobility limited by BLE pain. Pt will benefit from skilled PT to increase their independence and safety with mobility to allow discharge to the venue listed below.          Recommendations for follow up therapy are one component of a multi-disciplinary discharge planning process, led by the attending physician.  Recommendations may be updated based on patient status, additional functional criteria and insurance authorization.  Follow Up Recommendations Outpatient PT      Assistance Recommended at Discharge PRN  Patient can return home with the following  Assist for transportation;Assistance with cooking/housework;Help with stairs or ramp for entrance    Equipment Recommendations None recommended by PT  Recommendations for Other Services       Functional Status Assessment Patient has had a recent decline in their functional status and demonstrates the ability to make significant improvements in function in a reasonable and predictable amount of time.     Precautions / Restrictions Precautions Precautions: Other (comment) Precaution Comments: watch vitals      Mobility  Bed Mobility Overal bed mobility: Modified Independent                  Transfers Overall transfer level: Needs assistance Equipment used: None Transfers: Sit to/from Stand Sit to Stand: Supervision           General  transfer comment: increased time to rise    Ambulation/Gait Ambulation/Gait assistance: Min guard Gait Distance (Feet): 30 Feet Assistive device: None Gait Pattern/deviations: Step-through pattern, Decreased stride length Gait velocity: decreased Gait velocity interpretation: <1.31 ft/sec, indicative of household ambulator   General Gait Details: slow, guarded gait. Furniture walking in room. Declining hallway distances due to BLE pain.  Stairs            Wheelchair Mobility    Modified Rankin (Stroke Patients Only)       Balance Overall balance assessment: Mild deficits observed, not formally tested                                           Pertinent Vitals/Pain Pain Assessment Pain Assessment: Faces Faces Pain Scale: Hurts even more Pain Location: BLE with mobility Pain Descriptors / Indicators: Pressure, Tender Pain Intervention(s): Limited activity within patient's tolerance    Home Living Family/patient expects to be discharged to:: Private residence Living Arrangements: Alone Available Help at Discharge: Family;Friend(s);Available PRN/intermittently Type of Home: House Home Access: Stairs to enter   Entrance Stairs-Number of Steps: 1 Alternate Level Stairs-Number of Steps: flight Home Layout: Two level;1/2 bath on main level;Bed/bath upstairs Home Equipment: Hand held shower head Additional Comments: in the process of getting a handicapped accessible apartment    Prior Function Prior Level of Function : Independent/Modified Independent;Driving;Working/employed             Mobility Comments: no AD, 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 Her boyfriend has been driving her to/from work. ADLs Comments: mod I     Hand Dominance   Dominant Hand: Right    Extremity/Trunk Assessment   Upper Extremity Assessment Upper Extremity Assessment: Defer to OT evaluation    Lower Extremity Assessment Lower Extremity  Assessment: Generalized weakness    Cervical / Trunk Assessment Cervical / Trunk Assessment: Normal  Communication   Communication: No difficulties  Cognition Arousal/Alertness: Awake/alert Behavior During Therapy: Flat affect Overall Cognitive Status: Within Functional Limits for tasks assessed                                          General Comments      Exercises     Assessment/Plan    PT Assessment Patient needs continued PT services  PT Problem List Decreased strength;Decreased balance;Pain;Decreased mobility;Decreased activity tolerance       PT Treatment Interventions Gait training;Stair training;Therapeutic exercise;Therapeutic activities;Functional mobility training;Balance training;Patient/family education    PT Goals (Current goals can be found in the Care Plan section)  Acute Rehab PT Goals Patient Stated Goal: decrease pain PT Goal Formulation: With patient Time For Goal Achievement: 10/08/22 Potential to Achieve Goals: Good    Frequency Min 3X/week     Co-evaluation               AM-PAC PT "6 Clicks" Mobility  Outcome Measure Help needed turning from your back to your side while in a flat bed without using bedrails?: None Help needed moving from lying on your back to sitting on the side of a flat bed without using bedrails?: None Help needed moving to and from a bed to a chair (including a wheelchair)?: A Little Help needed standing up from a chair using your arms (e.g., wheelchair or bedside chair)?: A Little Help needed to walk in hospital room?: A Little Help needed climbing 3-5 steps with a railing? : A Little 6 Click Score: 20    End of Session Equipment Utilized During Treatment: Gait belt Activity Tolerance: Patient limited by pain Patient left: in bed;with call bell/phone within reach Nurse Communication: Mobility status PT Visit Diagnosis: Difficulty in walking, not elsewhere classified (R26.2);Muscle weakness  (generalized) (M62.81)    Time: [id number] PT Time Calculation (min) (ACUTE ONLY): 16 min   Charges:   PT Evaluation $PT Eval Moderate Complexity: 1 Mod          Gloriann Loan., PT  Office # [phone]   Lorriane Shire 09/24/2022, 10:38 AM

## 2022-09-24 NOTE — Progress Notes (Signed)
Daily Progress Note Intern Pager: [phone]  Patient name: [name] Medical record number: [id number] Date of birth: Mar 02, 1976 Age: 47 y.o. Gender: female  Primary Care Provider: Donney Dice, DO Consultants: Cardiology Code Status: Full  Pt Overview and Major Events to Date:  -Admitted 03/01  Assessment and Plan:  PMHx of Combined CHF, DM2, MDD, Morbid Obesity, NSTEMI, OSA, PCOS, PE, and Afib.  * Acute on chronic combined systolic (congestive) and diastolic (congestive) heart failure (HCC) Improved, respiratory status stable. UOP 3.25L. ECHO pending.  -Cardiology consulted, appreciate recommendations -Lasix '80mg'$  IV BID -Continue home spironolactone '25mg'$  daily and Jardiance '10mg'$  daily -Home Metoprolol restarted  -strict I/Os, daily weights -daily BMP, Mg  Elevated troponin Cardiology agrees with most likely demand ischemia and now trending down. -Cardiology consulted, appreciate recommendations  Poorly controlled type 2 diabetes mellitus ([place]) A1c pending. Glucose stable (132, 98, 132). Last A1c >15.5 (06/02/22) -Check updated A1c -CBG monitoring qAC and qHS -Semglee 15u daily -mSSI  Paroxysmal atrial fibrillation (HCC) In sinus rhythm currently, but would not be surprised if patient goes into a-fib RVR at some point due to significant volume overload. -tele monitoring -Start Eliquis per cardiology -Stop Heparin drip -restart home Metoprolol  Chronic nausea Likely secondary to gastroparesis although this has not been formally diagnosed. Borderline prolonged QT on EKG. -Compazine '5mg'$  IV x1 now -Continue home Reglan '5mg'$  qAC and qHS -Daily EKG  Hypokalemia K 3.3 today. S/p 50mq PO repletion. Mag 1.6 today s/p 2g. -check BMP, Mg AM -replete as appropriate. Goal K>4, Mg>2  Hypertension associated with diabetes (HCC) BP elevated on admission -Continue home losartan '50mg'$  daily    FEN/GI: heart healthy diet VTE Prophylaxis: on heparin gtt Dispo:  Pending clinical improvement  Subjective:  No acute events overnight, does endorse nausea that is chronic, did have episode of emesis.  No chest pain or shortness of breath.  Feels her breathing is improved.   Objective: Temp:  [97.5 F (36.4 C)-98.6 F (37 C)] 97.5 F (36.4 C) (03/02 0749) Pulse Rate:  [80-112] 83 (03/02 1054) Resp:  [18-20] 19 (03/02 0749) BP: (104-138)/(82-116) 112/83 (03/02 1054) SpO2:  [93 %-99 %] 95 % (03/02 0529) Weight:  [89.3 kg] 89.3 kg (03/02 0500) Physical Exam: General: NAD sitting comfortably in hospital bed Neuro: A&O responds to questions appropriately Cardiovascular: RRR, no murmurs, 2-3+ pitting edema bilateral lower extremities up to knee Respiratory: normal WOB on room air, bilateral crackles at lung bases Abdomen: soft, mild tenderness to palpation of epigastric area, no rebound or guarding Extremities: Moving all 4 extremities equally   Laboratory: Most recent CBC Lab Results  Component Value Date   WBC 6.7 09/24/2022   HGB 12.3 09/24/2022   HCT 39.4 09/24/2022   MCV 71.0 (L) 09/24/2022   PLT 213 09/24/2022   Most recent BMP    Latest Ref Rng & Units 09/24/2022    1:30 AM  BMP  Glucose 70 - 99 mg/dL 98   BUN 6 - 20 mg/dL 12   Creatinine 0.44 - 1.00 mg/dL 1.04   Sodium 135 - 145 mmol/L 133   Potassium 3.5 - 5.1 mmol/L 3.3   Chloride 98 - 111 mmol/L 96   CO2 22 - 32 mmol/L 28   Calcium 8.9 - 10.3 mg/dL 8.4     Other pertinent labs: Mag - 1.6   Imaging/Diagnostic Tests: Echo pending QSalvadore Oxford MD 09/24/2022, 12:36 PM  PGY-1, CGary CityIntern pager: [phone] text pages welcome Secure  chat group [facility] Teaching Service

## 2022-09-24 NOTE — Progress Notes (Signed)
Pt on room air and stable. Refuses CPAP QHS at this time. Will continue to monitor

## 2022-09-24 NOTE — Progress Notes (Signed)
ANTICOAGULATION CONSULT NOTE - Follow Up Consult  Pharmacy Consult for Heparin Indication: chest pain/ACS  Allergies  Allergen Reactions   Fish Allergy Swelling   Shellfish Allergy Anaphylaxis, Swelling and Other (See Comments)    Airway involvement including EMS - Has Epi-Pen   Metformin And Related Diarrhea   Trulicity [Dulaglutide] Nausea And Vomiting and Other (See Comments)    Multiple episodes of vomiting over multiple days   Nsaids Other (See Comments)    Per PCP interferes with daily meds (heart failure)   Advil [Ibuprofen] Other (See Comments)    Per PCP interferes with daily meds (heart failure)    Patient Measurements: Height: '4\' 11"'$  (149.9 cm) Weight: 89.3 kg (196 lb 12.8 oz) IBW/kg (Calculated) : 43.2 Heparin Dosing Weight: 65 kg  Vital Signs: Temp: 97.6 F (36.4 C) (03/02 0529) Temp Source: Oral (03/02 0529) BP: 104/91 (03/02 0529) Pulse Rate: 80 (03/02 0529)  Labs: Recent Labs    09/22/22 2050 09/22/22 2103 09/22/22 2134 09/23/22 0514 09/23/22 1218 09/23/22 1455 09/24/22 0130  HGB 13.0  --   --  12.4  --   --  12.3  HCT 41.3  --   --  39.6  --   --  39.4  PLT 232  --   --  228  --   --  213  LABPROT  --   --  16.0*  --   --   --   --   INR  --   --  1.3*  --   --   --   --   HEPARINUNFRC  --   --   --  0.24*  --  0.51 0.47  CREATININE 1.01* 1.00  --   --  0.98  --  1.04*  TROPONINIHS 197*  --  170*  --   --   --   --      Estimated Creatinine Clearance: 67.1 mL/min (A) (by C-G formula based on SCr of 1.04 mg/dL (H)).   Medications:  Infusions:   heparin 1,050 Units/hr (09/23/22 2223)    Assessment: 69 yoF presents with ShOB, increased BLE swelling, chest pain, and intractable N/V (unable to take PO meds).  Differential includes ACS v. HF exacerbation.  PMH significant for CAD,  HF with EF<20%, Afib and hx PE on warfarin PTA (last dose dose 2/29).  Pharmacy consulted to dose IV heparin.   Heparin level therapeutic and stable at 0.47 on  1050 units/hr. Hgb 12.3, PLTs 213 wnl. No overt s/sx bleeding noted.   Goal of Therapy:  Heparin level 0.3-0.7 units/ml Monitor platelets by anticoagulation protocol: Yes   Plan:  Continue heparin at 1050 units/hr Daily heparin level and CBC while on heparin Monitor s/sx bleeding  Eliseo Gum, PharmD PGY1 Pharmacy Resident   09/24/2022  7:35 AM

## 2022-09-24 NOTE — Anesthesia Procedure Notes (Signed)
Central Venous Catheter Insertion Performed by: Murvin Natal, MD, anesthesiologist Start/End3/08/2022 4:40 PM, 09/24/2022 4:50 PM Patient location: OOR procedure area. Preanesthetic checklist: patient identified, IV checked, site marked, risks and benefits discussed, surgical consent, monitors and equipment checked, pre-op evaluation, timeout performed and anesthesia consent Position: supine Patient sedated Hand hygiene performed , maximum sterile barriers used  and Seldinger technique used Catheter size: 8 Fr Total catheter length 16. Central line was placed.Double lumen Procedure performed using ultrasound guided technique. Ultrasound Notes:anatomy identified, needle tip was noted to be adjacent to the nerve/plexus identified, no ultrasound evidence of intravascular and/or intraneural injection and image(s) printed for medical record Attempts: 1 Following insertion, dressing applied, line sutured and Biopatch. Post procedure assessment: blood return through all ports  Patient tolerated the procedure well with no immediate complications.

## 2022-09-24 NOTE — Anesthesia Preprocedure Evaluation (Addendum)
Anesthesia Evaluation  Patient identified by MRN, date of birth, ID band Patient awake    Reviewed: Allergy & Precautions, NPO status , Patient's Chart, lab work & pertinent test results  Airway Mallampati: III  TM Distance: >3 FB Neck ROM: Full    Dental no notable dental hx.    Pulmonary asthma , sleep apnea    Pulmonary exam normal        Cardiovascular hypertension, Pt. on medications and Pt. on home beta blockers + CAD, + Past MI and +CHF  Normal cardiovascular exam  ECHO: 1. 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 Left  ventricular ejection fraction, by estimation, is <20%. The left ventricle  has severely decreased function. The left ventricle demonstrates global  hypokinesis. The left ventricular  internal cavity size was mildly dilated. Left ventricular diastolic  parameters are consistent with Grade III diastolic dysfunction  (restrictive).   2. Right ventricular systolic function is moderately reduced. The right  ventricular size is normal. There is mildly elevated pulmonary artery  systolic pressure. The estimated right ventricular systolic pressure is  [id number] mmHg.   3. Left atrial size was mildly dilated.   4. The mitral valve is normal in structure. Mild mitral valve  regurgitation. No evidence of mitral stenosis.   5. Tricuspid valve regurgitation is mild to moderate.   6. The aortic valve is tricuspid. Aortic valve regurgitation is not  visualized. No aortic stenosis is present.   7. The inferior vena cava is normal in size with <50% respiratory  variability, suggesting right atrial pressure of 8 mmHg.     Neuro/Psych  Headaches PSYCHIATRIC DISORDERS  Depression       GI/Hepatic negative GI ROS, Neg liver ROS,,,  Endo/Other  diabetes, Insulin Dependent  Morbid obesityPolycystic ovarian disease  Renal/GU Renal disease     Musculoskeletal negative musculoskeletal ROS (+)     Abdominal  (+) + obese  Peds  Hematology  (+) REFUSES BLOOD PRODUCTS  Anesthesia Other Findings CODE STROKE  Reproductive/Obstetrics                             Anesthesia Physical Anesthesia Plan  ASA: 4 and emergent  Anesthesia Plan: General   Post-op Pain Management:    Induction: Intravenous and Rapid sequence  PONV Risk Score and Plan: 3 and Ondansetron, Dexamethasone and Treatment may vary due to age or medical condition  Airway Management Planned: Oral ETT and Video Laryngoscope Planned  Additional Equipment: Arterial line  Intra-op Plan:   Post-operative Plan: Possible Post-op intubation/ventilation  Informed Consent: I have reviewed the patients History and Physical, chart, labs and discussed the procedure including the risks, benefits and alternatives for the proposed anesthesia with the patient or authorized representative who has indicated his/her understanding and acceptance.     Dental advisory given  Plan Discussed with: CRNA  Anesthesia Plan Comments:        Anesthesia Quick Evaluation

## 2022-09-24 NOTE — Consult Note (Signed)
NAME:  [name], MRN:  [id number], DOB:  03-19-1976, LOS: 1 ADMISSION DATE:  09/22/2022, CONSULTATION DATE:  09/24/2022 REFERRING MD:  Dr. Allegra Grana, CHIEF COMPLAINT:  Vent management   History of Present Illness:  [name] is a 47 y.o. with an extensive PMH that includes but not limited to Acute combined systolic and diastolic congestive heart failure, history of LV thrombus, prior NSTEMMI, prior PE, A-fib, type 2 diabetes, HTN, depression, and OSA who presented 3/1 with 2 week history of lower extremity edema, shortness of breath, and chest pain. Patient was admitted per FMTS for management of acute CHF exacerbation and possible NSTEMI  Afternoon of 3/2 patient suffered acute change in mental status resulting in code stroke being called.  This is in the setting of subtherapeutic INR of 1.3 despite anticoagulation with Coumadin on admission with history of chronic LV thrombus and paroxysmal atrial fibrillation.  CTA head revealed likely evolving stroke with proximal right M2 occlusion with minimal collateral flow throughout the MCA distribution.  Neurology and interventional neurology consulted and patient was taken urgently for thrombectomy.  Postoperatively patient remained on ventilator prompting PCCM consult.  Pertinent  Medical History  Includes but not limited to: Acute combined systolic and diastolic congestive heart failure, history of LV thrombus, prior NSTEMMI, prior PE, A-fib, type 2 diabetes, HTN, depression, and OSA  Significant Hospital Events: Including procedures, antibiotic start and stop dates in addition to other pertinent events   3/2 Sedated on vent   Interim History / Subjective:  As above   Objective   Blood pressure (!) 139/106, pulse 87, temperature (!) 97.5 F (36.4 C), temperature source Oral, resp. rate 20, height '4\' 11"'$  (1.499 m), weight 89.3 kg, last menstrual period 06/22/2022, SpO2 99 %.        Intake/Output Summary (Last 24 hours) at 09/24/2022  1555 Last data filed at 09/24/2022 1052 Gross per 24 hour  Intake 400 ml  Output 3250 ml  Net -2850 ml   Filed Weights   09/22/22 1941 09/24/22 0500  Weight: 87.9 kg 89.3 kg    Examination: General: Acute on chronically ill appearing adult female lying in bed on mechanical ventilation, in NAD HEENT: ETT, MM pink/moist, PERRL 39m Neuro: Deeply sedated on exam  CV: s1s2 regular rate and rhythm, no murmur, rubs, or gallops,  PULM:  Clear to auscultation, no increased work of breathing, no added breath sounds GI: soft, bowel sounds active in all 4 quadrants, non-tender, non-distended Extremities: warm/dry, no edema  Skin: no rashes or lesions  Resolved Hospital Problem list     Assessment & Plan:  Postoperative ventilator management  -Remained intubated post thrombectomy  P: Continue ventilator support with lung protective strategies  Wean PEEP and FiO2 for sats greater than 90%. Head of bed elevated 30 degrees. Plateau pressures less than 30 cm H20.  Follow intermittent chest x-ray and ABG.   SAT/SBT as tolerated, mentation preclude extubation  Ensure adequate pulmonary hygiene  Follow cultures  VAP bundle in place  PAD protocol  Acute CVA  -Acute neuro change seen in hospital with code stroke called.  CTA head revealed likely evolving stroke with proximal right M2 occlusion with minimal collateral flow throughout the MCA distribution.  Neurology and interventional neurology consulted and patient was taken urgently for thrombectom P: Primary management per neurology  Maintain neuro protective measures; goal for eurothermia, euglycemia, eunatermia, normoxia, and PCO2 goal of 35-40 Nutrition and bowel regiment  Seizure precautions  Aspirations precautions  Secondary stroke  preventions BP parameters per interventional neurology   In hospital cardiac arrest -On induction for thrombectomy patient lost blood pressure follow by lose of pulses, 42mns of ACLS protocol provided  prior to ROSC P: Continuous telemetry Continue pressors for MAP goal >65 Consider repeat ECHO   Acute on chronic combined systolic and diastolic congestive heart failure  Nonischemic cardiomyopathy  -Most recent ECHO August 2023 with ED < 20% and grade III diastolic dysfunction  Elevated Troponin  P: Primary management per cardiology  Continuous telemetry  Strict intake and output  Daily weight to assess volume status Continue IV lasix, Losartan, Toprol-XL, Spironolactone, and Jardance Closely monitor renal function and electrolytes  Vent support as above  Repeat ECHO pending  Optimize electrolytes   Chronic LV thrombus  Subtherapeutic INR on Coumadin -ECHO August 2023 with 2.2 x 1.4LV thrombus at the apex with some mobility -INR on admit 1.3 P: Discuss with neurology ability to continue heparin drip in the setting of acute CVA  Paroxysmal Atrial Fibrillation  P: Anticoagulation as above  Continuous telemetry  Type 2 diabetes A1C 10.8 on admit  P: SSI  CBG goal 140-180  Hypokalemia P: Trend Bmet Supplement as needed    Best Practice (right click and "Reselect all SmartList Selections" daily)   Diet/type: NPO DVT prophylaxis: SCD GI prophylaxis: PPI Lines: Central line Foley:  Yes, and it is still needed Code Status:  full code Last date of multidisciplinary goals of care discussion: Pending   Labs   CBC: Recent Labs  Lab 09/22/22 2050 09/23/22 0514 09/24/22 0130  WBC 8.7 7.5 6.7  NEUTROABS 6.0  --   --   HGB 13.0 12.4 12.3  HCT 41.3 39.6 39.4  MCV 71.1* 71.4* 71.0*  PLT 232 228 [id number]   Basic Metabolic Panel: Recent Labs  Lab 09/22/22 2050 09/22/22 2103 09/23/22 1218 09/24/22 0130  NA 133*  --  133* 133*  K 3.2*  --  3.6 3.3*  CL 95*  --  96* 96*  CO2 26  --  22 28  GLUCOSE 245*  --  267* 98  BUN 15  --  14 12  CREATININE 1.01* 1.00 0.98 1.04*  CALCIUM 8.1*  --  8.1* 8.4*  MG  --   --  1.3* 1.6*   GFR: Estimated Creatinine  Clearance: 67.1 mL/min (A) (by C-G formula based on SCr of 1.04 mg/dL (H)). Recent Labs  Lab 09/22/22 2050 09/23/22 0514 09/24/22 0130  WBC 8.7 7.5 6.7    Liver Function Tests: No results for input(s): "AST", "ALT", "ALKPHOS", "BILITOT", "PROT", "ALBUMIN" in the last 168 hours. No results for input(s): "LIPASE", "AMYLASE" in the last 168 hours. No results for input(s): "AMMONIA" in the last 168 hours.  ABG    Component Value Date/Time   PHART 7.451 (H) 09/17/2021 2204   PCO2ART 31.6 (L) 09/17/2021 2204   PO2ART 114 (H) 09/17/2021 2204   HCO3 28.1 (H) 09/22/2022 2128   TCO2 33 (H) 09/20/2021 0840   TCO2 32 09/20/2021 0840   ACIDBASEDEF 0.8 10/18/2021 0750   O2SAT 80.3 09/22/2022 2128     Coagulation Profile: Recent Labs  Lab 09/22/22 2134  INR 1.3*    Cardiac Enzymes: No results for input(s): "CKTOTAL", "CKMB", "CKMBINDEX", "TROPONINI" in the last 168 hours.  HbA1C: HbA1c, POC (controlled diabetic range)  Date/Time Value Ref Range Status  08/31/2021 11:20 AM 13.2 (A) 0.0 - 7.0 % Final  12/11/2019 10:39 AM 9.3 (A) 0.0 - 7.0 % Final  Hgb A1c MFr Bld  Date/Time Value Ref Range Status  09/23/2022 12:18 PM 10.8 (H) 4.8 - 5.6 % Final    Comment:    (NOTE)         Prediabetes: 5.7 - 6.4         Diabetes: >6.4         Glycemic control for adults with diabetes: <7.0   06/02/2022 09:36 PM >15.5 (H) 4.8 - 5.6 % Final    Comment:    (NOTE)         Prediabetes: 5.7 - 6.4         Diabetes: >6.4         Glycemic control for adults with diabetes: <7.0     CBG: Recent Labs  Lab 09/23/22 1617 09/23/22 2121 09/24/22 0620 09/24/22 1210 09/24/22 1302  GLUCAP 224* 132* 133* 168* 172*    Review of Systems:   Unable to assess   Past Medical History:  She,  has a past medical history of Abnormal Pap smear, Acute combined systolic and diastolic heart failure ([place]) (02/19/2019), Acute heart failure ([place]) (01/29/2022), Acute on chronic combined systolic and diastolic  CHF (congestive heart failure) ([place]) (02/19/2019), Acute on chronic congestive heart failure ([place]), Acute on chronic congestive heart failure ([place]), Acute on chronic diastolic CHF (congestive heart failure) ([place]) (10/19/2021), Acute on chronic systolic heart failure ([place]) (02/22/2022), Anemia, Asthma, Atrial fibrillation ([place]), CHF (congestive heart failure) ([place]) (11/02/2021), CHF exacerbation ([place]) (10/17/2021), Diabetes mellitus, DKA, type 2 ([place]) (02/06/2019), Elevated troponin (09/30/2019), Heart failure with reduced ejection fraction ([place]), History of Left ventricular thrombus (09/22/2021), 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   Surgical History:   Past Surgical History:  Procedure Laterality Date   BIOPSY  11/05/2021   Procedure: BIOPSY;  Surgeon: Daryel November, MD;  Location: [facility] ENDOSCOPY;  Service: Gastroenterology;;   CERVIX LESION DESTRUCTION     CESAREAN SECTION     ESOPHAGOGASTRODUODENOSCOPY (EGD) WITH PROPOFOL N/A 11/05/2021   Procedure: ESOPHAGOGASTRODUODENOSCOPY (EGD) WITH PROPOFOL;  Surgeon: Daryel November, MD;  Location: [facility];  Service: Gastroenterology;  Laterality: N/A;   RIGHT/LEFT HEART CATH AND CORONARY ANGIOGRAPHY N/A 03/29/2019   Procedure: RIGHT/LEFT HEART CATH AND CORONARY ANGIOGRAPHY;  Surgeon: Larey Dresser, MD;  Location: [facility] CV LAB;  Service: Cardiovascular;  Laterality: N/A;   RIGHT/LEFT HEART CATH AND CORONARY ANGIOGRAPHY N/A 09/20/2021   Procedure: RIGHT/LEFT HEART CATH AND CORONARY ANGIOGRAPHY;  Surgeon: Larey Dresser, MD;  Location: [facility];  Service:  Cardiovascular;  Laterality: N/A;     Social History:   reports that she has never smoked. She has never used smokeless tobacco. She reports that she does not drink alcohol and does not use drugs.   Family History:  Her family history includes Diabetes in her father and mother; Hypertension in her father and mother.   Allergies Allergies  Allergen Reactions   Fish Allergy Swelling   Shellfish Allergy Anaphylaxis, Swelling and Other (See Comments)    Airway involvement including EMS - Has Epi-Pen   Metformin And Related Diarrhea   Trulicity [Dulaglutide] Nausea And Vomiting and Other (See Comments)    Multiple episodes of vomiting over multiple days   Nsaids Other (See Comments)    Per PCP interferes with daily meds (heart failure)  Advil [Ibuprofen] Other (See Comments)    Per PCP interferes with daily meds (heart failure)     Home Medications  Prior to Admission medications   Medication Sig Start Date End Date Taking? Authorizing Provider  acetaminophen (TYLENOL) 500 MG tablet Take 500-1,000 mg by mouth every 6 (six) hours as needed for mild pain or headache.   Yes [provider]  atorvastatin (LIPITOR) 80 MG tablet Take 1 tablet (80 mg total) by mouth daily. 09/08/22  Yes Janina Mayo, MD  empagliflozin (JARDIANCE) 10 MG TABS tablet Take 1 tablet (10 mg total) by mouth daily. 07/01/22  Yes Amin, Ankit Chirag, MD  insulin aspart (NOVOLOG FLEXPEN) 100 UNIT/ML FlexPen Inject 12 Units into the skin daily before supper. 07/01/22  Yes Amin, Ankit Chirag, MD  insulin glargine (LANTUS) 100 UNIT/ML Solostar Pen Inject 15 Units into the skin daily. 07/01/22  Yes Amin, Jeanella Flattery, MD  losartan (COZAAR) 50 MG tablet Take 1 tablet (50 mg total) by mouth daily. 07/01/22  Yes Amin, Jeanella Flattery, MD  warfarin (COUMADIN) 7.5 MG tablet Take 1-2 tablets (7.5-15 mg total) by mouth daily as directed by [facility] Patient taking differently: Take 7.5-11.25 mg by mouth as directed. Take 1  tablet (7.5 mg) tablet by mouth every Monday & Take 1.5 tablets (11.25 mg) all other days 07/01/22  Yes Amin, Jeanella Flattery, MD  Accu-Chek Softclix Lancets lancets Use as instructed 02/12/19   Caroline More, DO  Accu-Chek Softclix Lancets lancets Use up 4 (four) times daily as directed 06/06/22   [place] Donnamarie Poag, DO  blood glucose meter kit and supplies Use up to 4 (four) times daily as directed 06/06/22   [place] Donnamarie Poag, DO  ferrous sulfate 325 (65 FE) MG tablet Take 1 tablet (325 mg total) by mouth daily with breakfast. Patient not taking: Reported on 09/23/2022 07/02/22   Damita Lack, MD  glucose blood (ONETOUCH VERIO) test strip Use up to 4 (four) times daily as directed 06/06/22   [place] Donnamarie Poag, DO  hydrOXYzine (ATARAX) 10 MG tablet Take 1 tablet (10 mg total) by mouth 3 (three) times daily as needed. Patient taking differently: Take 10 mg by mouth 3 (three) times daily as needed for anxiety. 10/22/21   Gladys Damme, MD  Insulin Pen Needle (UNIFINE PENTIPS) 31G X 5 MM MISC USE AS DIRECTED 2 TIMES DAILY 09/02/21   Hensel, Jamal Collin, MD  metoCLOPramide (REGLAN) 5 MG tablet Take 1 tablet (5 mg total) by mouth 4 (four) times daily -  before meals and at bedtime. Patient not taking: Reported on 09/23/2022 08/09/22   Precious Gilding, DO  metoprolol succinate (TOPROL-XL) 25 MG 24 hr tablet Take 1 tablet (25 mg total) by mouth daily. 07/01/22   Amin, Jeanella Flattery, MD  polyethylene glycol (MIRALAX / GLYCOLAX) 17 g packet Take 17 g by mouth daily as needed for mild constipation. 06/06/22   [place] Donnamarie Poag, DO  prochlorperazine (COMPAZINE) 5 MG tablet Take 1 tablet (5 mg total) by mouth every 6 (six) hours as needed for refractory nausea / vomiting. 08/09/22   Precious Gilding, DO  senna-docusate (SENOKOT-S) 8.6-50 MG tablet Take 1 tablet by mouth at bedtime as needed for moderate constipation. 07/01/22   Amin, Jeanella Flattery, MD  sertraline (ZOLOFT) 50 MG tablet Take 50 mg by mouth daily.    [provider]  spironolactone (ALDACTONE) 25 MG tablet Take 25 mg by mouth daily. Take 1 Tablet Daily    [provider]  Torsemide 40 MG TABS Take 40 mg by mouth 2 (two) times daily. 09/08/22   Janina Mayo, MD  traZODone (DESYREL) 50 MG tablet Take 1 tablet (50 mg total) by mouth at bedtime. 07/01/22   Amin, Jeanella Flattery, MD  carvedilol (COREG) 3.125 MG tablet Take 1 tablet (3.125 mg total) by mouth 2 (two) times daily with a meal. 02/27/22 07/01/22  Oswald Hillock, MD     Critical care time:   CRITICAL CARE Performed by: Ada Woodbury D. Harris   Total critical care time: 45 minutes  Critical care time was exclusive of separately billable procedures and treating other patients.  Critical care was necessary to treat or prevent imminent or life-threatening deterioration.  Critical care was time spent personally by me on the following activities: development of treatment plan with patient and/or surrogate as well as nursing, discussions with consultants, evaluation of patient's response to treatment, examination of patient, obtaining history from patient or surrogate, ordering and performing treatments and interventions, ordering and review of laboratory studies, ordering and review of radiographic studies, pulse oximetry and re-evaluation of patient's condition.  Nimsi Males D. Harris, NP-C [facility] Personal contact information can be found on Amion  If no contact or response made please call [phone] 09/24/2022, 4:25 PM

## 2022-09-24 NOTE — Progress Notes (Signed)
Attempted to call patient's daughter Leroy Kennedy) listed as emergency contact but unable to reach daughter or leave a voicemail.  Called Mr. Corene Cornea who is the second name listed on her emergency contact. Corene Cornea was able to verify patient.  Informed Corene Cornea of patient recent event and  she was found to have stroke. She is currently being transferred to the ICU with plans to undergo thrombectomy. Answered all question for Corene Cornea who also agreed he will update patient's daughter as soon as he gets in contact with her.  Corene Cornea would like an update after procedure is complete. I informed Corene Cornea the night team will update him if no update from me by end of the shift.  Alen Bleacher, MD PGY-2, [facility] Family Medicine Resident  Please page [phone] with questions.

## 2022-09-25 ENCOUNTER — Inpatient Hospital Stay (HOSPITAL_COMMUNITY): Payer: Medicare Other

## 2022-09-25 ENCOUNTER — Encounter (HOSPITAL_COMMUNITY): Payer: Self-pay | Admitting: Radiology

## 2022-09-25 DIAGNOSIS — I480 Paroxysmal atrial fibrillation: Secondary | ICD-10-CM | POA: Diagnosis not present

## 2022-09-25 DIAGNOSIS — I63411 Cerebral infarction due to embolism of right middle cerebral artery: Secondary | ICD-10-CM | POA: Diagnosis not present

## 2022-09-25 DIAGNOSIS — I5021 Acute systolic (congestive) heart failure: Secondary | ICD-10-CM | POA: Diagnosis not present

## 2022-09-25 DIAGNOSIS — I5043 Acute on chronic combined systolic (congestive) and diastolic (congestive) heart failure: Secondary | ICD-10-CM | POA: Diagnosis not present

## 2022-09-25 DIAGNOSIS — I513 Intracardiac thrombosis, not elsewhere classified: Secondary | ICD-10-CM | POA: Diagnosis not present

## 2022-09-25 LAB — BASIC METABOLIC PANEL
Anion gap: 14 (ref 5–15)
BUN: 13 mg/dL (ref 6–20)
CO2: 26 mmol/L (ref 22–32)
Calcium: 8.9 mg/dL (ref 8.9–10.3)
Chloride: 96 mmol/L — ABNORMAL LOW (ref 98–111)
Creatinine, Ser: 1.14 mg/dL — ABNORMAL HIGH (ref 0.44–1.00)
GFR, Estimated: >60 mL/min (ref >60)
Glucose, Bld: 140 mg/dL — ABNORMAL HIGH (ref 70–99)
Potassium: 4.6 mmol/L (ref 3.5–5.1)
Sodium: 136 mmol/L (ref 135–145)

## 2022-09-25 LAB — ECHOCARDIOGRAM COMPLETE
Est EF: 15
Height: 59 in
MV M vel: 3.64 m/s
MV Peak grad: 53 mmHg
S' Lateral: 4.8 cm
Single Plane A4C EF: 16 %
Weight: 3266.34 oz

## 2022-09-25 LAB — MAGNESIUM: Magnesium: 2.4 mg/dL (ref 1.7–2.4)

## 2022-09-25 LAB — CBC
HCT: 38.5 % (ref 36.0–46.0)
Hemoglobin: 12.5 g/dL (ref 12.0–15.0)
MCH: 23 pg — ABNORMAL LOW (ref 26.0–34.0)
MCHC: 32.5 g/dL (ref 30.0–36.0)
MCV: 70.8 fL — ABNORMAL LOW (ref 80.0–100.0)
Platelets: 234 10*3/uL (ref 150–400)
RBC: 5.44 MIL/uL — ABNORMAL HIGH (ref 3.87–5.11)
RDW: 24.5 % — ABNORMAL HIGH (ref 11.5–15.5)
WBC: 7.9 10*3/uL (ref 4.0–10.5)
nRBC: 0 % (ref 0.0–0.2)

## 2022-09-25 LAB — GLUCOSE, CAPILLARY
Glucose-Capillary: 134 mg/dL — ABNORMAL HIGH (ref 70–99)
Glucose-Capillary: 132 mg/dL — ABNORMAL HIGH (ref 70–99)
Glucose-Capillary: 113 mg/dL — ABNORMAL HIGH (ref 70–99)
Glucose-Capillary: 133 mg/dL — ABNORMAL HIGH (ref 70–99)

## 2022-09-25 LAB — TRIGLYCERIDES: Triglycerides: 116 mg/dL (ref <150)

## 2022-09-25 LAB — HEPARIN LEVEL (UNFRACTIONATED): Heparin Unfractionated: 0.13 IU/mL — ABNORMAL LOW (ref 0.30–0.70)

## 2022-09-25 LAB — PHOSPHORUS: Phosphorus: 5.4 mg/dL — ABNORMAL HIGH (ref 2.5–4.6)

## 2022-09-25 MED ORDER — FUROSEMIDE 10 MG/ML IJ SOLN
60.0000 mg | Freq: Once | INTRAMUSCULAR | Status: AC
  Administered 2022-09-25: 60 mg via INTRAVENOUS
  Filled 2022-09-25: qty 6

## 2022-09-25 MED ORDER — HEPARIN (PORCINE) 25000 UT/250ML-% IV SOLN
950.0000 [IU]/h | INTRAVENOUS | Status: AC
  Administered 2022-09-25: 800 [IU]/h via INTRAVENOUS
  Administered 2022-09-26 – 2022-09-30 (×4): 950 [IU]/h via INTRAVENOUS
  Filled 2022-09-25 (×5): qty 250

## 2022-09-25 MED ORDER — MAGNESIUM SULFATE 2 GM/50ML IV SOLN
2.0000 g | Freq: Once | INTRAVENOUS | Status: AC
  Administered 2022-09-25: 2 g via INTRAVENOUS
  Filled 2022-09-25: qty 50

## 2022-09-25 MED ORDER — PANTOPRAZOLE SODIUM 40 MG IV SOLR
40.0000 mg | Freq: Every day | INTRAVENOUS | Status: DC
  Administered 2022-09-25 – 2022-09-30 (×6): 40 mg via INTRAVENOUS
  Filled 2022-09-25 (×6): qty 10

## 2022-09-25 MED ORDER — POTASSIUM CHLORIDE 20 MEQ PO PACK
40.0000 meq | PACK | Freq: Once | ORAL | Status: AC
  Administered 2022-09-25: 40 meq
  Filled 2022-09-25: qty 2

## 2022-09-25 NOTE — Progress Notes (Signed)
ANTICOAGULATION CONSULT NOTE -   Pharmacy Consult for Heparin Indication:  LV Thrombus  Allergies  Allergen Reactions   Fish Allergy Swelling   Shellfish Allergy Anaphylaxis, Swelling and Other (See Comments)    Airway involvement including EMS - Has Epi-Pen   Metformin And Related Diarrhea   Trulicity [Dulaglutide] Nausea And Vomiting and Other (See Comments)    Multiple episodes of vomiting over multiple days   Nsaids Other (See Comments)    Per PCP interferes with daily meds (heart failure)   Advil [Ibuprofen] Other (See Comments)    Per PCP interferes with daily meds (heart failure)    Patient Measurements: Height: 4' 11.02" (149.9 cm) Weight: 92.6 kg (204 lb 2.3 oz) IBW/kg (Calculated) : 43.24 Heparin Dosing Weight: 65.6 kg  Vital Signs: Temp: 97.9 F (36.6 C) (03/03 2000) Temp Source: Axillary (03/03 2000) BP: 114/85 (03/03 2300) Pulse Rate: 81 (03/03 2345)  Labs: Recent Labs    09/23/22 0514 09/23/22 1218 09/23/22 1455 09/24/22 0130 09/24/22 2231 09/25/22 0508 09/25/22 2258  HGB 12.4  --   --  12.3  --  12.5  --   HCT 39.6  --   --  39.4  --  38.5  --   PLT 228  --   --  213  --  234  --   HEPARINUNFRC 0.24*  --  0.51 0.47  --   --  0.13*  CREATININE  --    < >  --  1.04* 1.08* 1.14*  --    < > = values in this interval not displayed.     Estimated Creatinine Clearance: 62.6 mL/min (A) (by C-G formula based on SCr of 1.14 mg/dL (H)).   Medical History: Past Medical History:  Diagnosis Date   Abnormal Pap smear    Acute combined systolic and diastolic heart failure ([place]) 02/19/2019   Wt Readings from Last 3 Encounters:  09/18/21  84.7 kg  09/02/21  84.4 kg  08/31/21  84.2 kg         Acute heart failure ([place]) 01/29/2022   Acute on chronic combined systolic and diastolic CHF (congestive heart failure) ([place]) 02/19/2019   Acute on chronic congestive heart failure (HCC)    Acute on chronic congestive heart failure (HCC)    Acute on chronic diastolic  CHF (congestive heart failure) ([place]) 10/19/2021   Acute on chronic systolic heart failure ([place]) 02/22/2022   Anemia    Asthma    Atrial fibrillation ([place])    history of paroxysmal atrial fibrillation   CHF (congestive heart failure) ([place]) 11/02/2021   CHF exacerbation ([place]) 10/17/2021   Diabetes mellitus    DKA, type 2 ([place]) 02/06/2019   Elevated troponin 09/30/2019   Heart failure with reduced ejection fraction ([place])    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     Migraines    Morbid obesity ([place]) 02/06/2019   Nausea & vomiting 06/26/2022   Nonischemic cardiomyopathy ([place]) 09/22/2021   NSTEMI (non-ST elevated myocardial infarction) ([place]) 02/23/2022   Obstructive sleep apnea 06/30/2015   Polycystic ovarian disease     Assessment:  47 y.o. 2 week  history of lower extremity edema, shortness of breath, and chest pain. Patient developed stroke as inpatient. Now found to have an LV Thrombus.  Pharmacy consulted to start heparin infusion for LV thrombus using the stroke protocol (no bolus).  Patient on warfarin PTA.  Initial heparin level below goal: 0.13 on 800 units/hr; no issues with infusion or overt s/sx of bleeding reported by RN; CBC WNL  Goal of Therapy:  Heparin level 0.3-0.5 units/ml Monitor platelets by anticoagulation protocol: Yes   Plan:  Increase heparin infusion at 950 units/hr Check anti-Xa level in 6 hours and daily while on heparin Continue to monitor H&H and platelets  Georga Bora, PharmD Clinical Pharmacist 09/25/2022 11:49 PM Please check AMION for all [place] numbers

## 2022-09-25 NOTE — Progress Notes (Signed)
Rounding Note    Patient Name: [name] Date of Encounter: 09/25/2022  [place] Cardiologist: None [place]   Subjective   Acute change afternoon of 09/24/22, found to have left sided weakness and right gaze deviation. Did not receive TNK due to being on IV heparin. 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 INR was subtherapeutic due to medication non-adherence at home. Upon anesthesia induction patient lost BP and pulses, 2 mins of ACLS provided per PCCM documentation. PCCM and stroke team now following.  Seen in ICU bed 4N20, with daughter at bedside. Sedation off, eyes open, patient not participating in conversation when spoken to directly. Remains intubated. Family questions answered from cardiac perspective.  Inpatient Medications    Scheduled Meds:   stroke: early stages of recovery book   Does not apply Once   atorvastatin  80 mg Per Tube QHS   Chlorhexidine Gluconate Cloth  6 each Topical Q0600   docusate  100 mg Per Tube BID   furosemide  60 mg Intravenous Once   insulin aspart  0-15 Units Subcutaneous TID WC   metoCLOPramide  5 mg Per Tube TID AC & HS   mouth rinse  15 mL Mouth Rinse Q2H   polyethylene glycol  17 g Per Tube Daily   potassium chloride  40 mEq Per Tube Once   Continuous Infusions:  clevidipine Stopped (09/24/22 1818)   magnesium sulfate bolus IVPB     phenylephrine (NEO-SYNEPHRINE) Adult infusion 35 mcg/min (09/25/22 0800)   propofol (DIPRIVAN) infusion 10 mcg/kg/min (09/25/22 0800)   PRN Meds: acetaminophen **OR** acetaminophen (TYLENOL) oral liquid 160 mg/5 mL **OR** acetaminophen, fentaNYL (SUBLIMAZE) injection, fentaNYL (SUBLIMAZE) injection, mouth rinse   Vital Signs    Vitals:   09/25/22 0700 09/25/22 0715 09/25/22 0734 09/25/22 0800  BP: (!) 128/95   (!) 134/108  Pulse: 74 74  78  Resp: (!) 0 '20 18 20  '$ Temp: 97.7 F (36.5 C) 97.7 F (36.5 C)  97.9 F (36.6 C)  TempSrc:      SpO2: 97% 100%  99%   Weight:      Height:        Intake/Output Summary (Last 24 hours) at 09/25/2022 0849 Last data filed at 09/25/2022 0800 Gross per 24 hour  Intake 1035.67 ml  Output 2025 ml  Net -989.33 ml      09/25/2022    5:00 AM 09/24/2022    8:00 PM 09/24/2022    5:00 AM  Last 3 Weights  Weight (lbs) 204 lb 2.3 oz 205 lb 14.6 oz 196 lb 12.8 oz  Weight (kg) 92.6 kg 93.4 kg 89.268 kg      Telemetry    Normal sinus rhythm, NSVT 6 beats- Personally Reviewed  ECG   09/24/22: NSR, nonspec T wave abnl, Qtc 474- Personally Reviewed  Physical Exam   GEN: intubated. Neck: + JVD Cardiac: RRR, no murmurs, rubs, or gallops.  Respiratory: Diminished breath sounds GI: Soft, nontender, non-distended  MS: 2+ edema Neuro:  intubated  Psych: intubated  Labs    High Sensitivity Troponin:   Recent Labs  Lab 09/22/22 2050 09/22/22 2134  TROPONINIHS 197* 170*     Chemistry Recent Labs  Lab 09/23/22 1218 09/24/22 0130 09/24/22 2231  NA 133* 133* 135  K 3.6 3.3* 4.1  CL 96* 96* 96*  CO2 '22 28 27  '$ GLUCOSE 267* 98 151*  BUN '14 12 12  '$ CREATININE 0.98 1.04* 1.08*  CALCIUM 8.1* 8.4* 8.6*  MG 1.3* 1.6* 1.9  GFRNONAA >60 >60 >60  ANIONGAP '15 9 12    '$ Lipids  Recent Labs  Lab 09/24/22 0130 09/25/22 0508  CHOL 136  --   TRIG 62 116  HDL 27*  --   LDLCALC 97  --   CHOLHDL 5.0  --     Hematology Recent Labs  Lab 09/23/22 0514 09/24/22 0130 09/25/22 0508  WBC 7.5 6.7 7.9  RBC 5.55* 5.55* 5.44*  HGB 12.4 12.3 12.5  HCT 39.6 39.4 38.5  MCV 71.4* 71.0* 70.8*  MCH 22.3* 22.2* 23.0*  MCHC 31.3 31.2 32.5  RDW 24.8* 23.9* 24.5*  PLT 228 213 234   Thyroid  Recent Labs  Lab 09/24/22 0130  TSH 3.269    BNP Recent Labs  Lab 09/22/22 2050  BNP 688.8*    DDimer No results for input(s): "DDIMER" in the last 168 hours.   Radiology    DG Abd Portable 1V  Result Date: 09/24/2022 CLINICAL DATA:  OG tube placement EXAM: PORTABLE ABDOMEN - 1 VIEW COMPARISON:  None Available. FINDINGS:  Enteric tube terminates in the gastric antrum. IMPRESSION: Enteric tube terminates in the gastric antrum. Electronically Signed   By: Julian Hy M.D.   On: 09/24/2022 20:23   DG CHEST PORT 1 VIEW  Result Date: 09/24/2022 CLINICAL DATA:  Status post intubation. EXAM: PORTABLE CHEST 1 VIEW COMPARISON:  09/22/2022. FINDINGS: Endotracheal tube tip is just above the carina by approximately 7 mm and is directed towards the right mainstem bronchus. Right IJ catheter tip is in the superior cavoatrial junction. No pneumothorax after catheter placement. Unchanged cardiac enlargement. No significant pleural fluid or airspace disease. Platelike atelectasis noted in the left midlung. IMPRESSION: 1. Low position of endotracheal tube with tip just above the carina and directed towards the right mainstem bronchus. Recommend retraction by 1 cm. 2. Right IJ catheter tip in the superior cavoatrial junction. 3. Cardiac enlargement. These results will be called to the ordering clinician or representative by the Radiologist Assistant, and communication documented in the PACS or [employer]. Electronically Signed   By: Kerby Moors M.D.   On: 09/24/2022 17:50   CT HEAD WO CONTRAST (5MM)  Result Date: 09/24/2022 CLINICAL DATA:  Left-sided weakness and left gaze deviation. EXAM: CT ANGIOGRAPHY HEAD AND NECK TECHNIQUE: Multidetector CT imaging of the head and neck was performed using the standard protocol during bolus administration of intravenous contrast. Multiplanar CT image reconstructions and MIPs were obtained to evaluate the vascular anatomy. Carotid stenosis measurements (when applicable) are obtained utilizing NASCET criteria, using the distal internal carotid diameter as the denominator. RADIATION DOSE REDUCTION: This exam was performed according to the departmental dose-optimization program which includes automated exposure control, adjustment of the mA and/or kV according to patient size and/or use of  iterative reconstruction technique. CONTRAST:  13m OMNIPAQUE IOHEXOL 350 MG/ML SOLN COMPARISON:  Brain MRI 11/07/2021 FINDINGS: CT HEAD FINDINGS Brain: There is questionable loss of gray-white differentiation in the posterior insula suspicious for early infarct in the MCA distribution. The remainder of the MCA territory appears spared at this time. ASPECTS is 9. There is no acute intracranial hemorrhage or extra-axial fluid collection. Background parenchymal volume is normal. The ventricles are normal in size. Probable prominent perivascular spaces are noted in the bilateral basal ganglia. The pituitary and suprasellar region are normal. There is no mass lesion. There is no mass effect or midline shift. Vascular: See below. Skull: Normal. Negative for fracture or focal lesion. Sinuses/Orbits: There is  mild mucosal thickening in the paranasal sinuses. There is a rightward gaze deviation. Other: None. Review of the MIP images confirms the above findings CTA NECK FINDINGS Aortic arch: The imaged aortic arch is normal. The origins of the major branch vessels are patent. The subclavian arteries are patent to the level imaged. Right carotid system: The right common, internal, and external carotid arteries are patent with mild plaque in the common carotid artery and at the bifurcation but no hemodynamically significant stenosis or occlusion. There is no evidence of dissection or aneurysm. Left carotid system: The left common, internal, and external carotid arteries are patent with mild plaque at the bifurcation but no hemodynamically significant stenosis or occlusion. There is no evidence of dissection or aneurysm Vertebral arteries: The vertebral arteries are patent, without hemodynamically significant stenosis or occlusion. There is no evidence of dissection or aneurysm. Skeleton: There is no acute osseous abnormality or suspicious osseous lesion. There is no visible canal hematoma. Other neck: The soft tissues of the  neck are unremarkable. Upper chest: There is an incompletely imaged right pleural effusion. The imaged lung apices are otherwise clear. Review of the MIP images confirms the above findings CTA HEAD FINDINGS Anterior circulation: The intracranial ICAs are patent. The right M1 segment is patent. Small patent MCA branches are seen in the region of the frontal operculum; however, there is essentially no other collateral flow in the remainder of the MCA distribution. The left M1 segment and distal branches are patent, without proximal stenosis or occlusion. The left A1 segment is relatively hypoplastic, likely developmental. The dominant right A1 segment is patent. The distal ACA branches are patent, without proximal stenosis or occlusion. There is no aneurysm or AVM. Posterior circulation: The bilateral V4 segments are patent. The basilar artery is patent, diminutive in caliber. The bilateral PCAs are patent, supplied by posterior communicating arteries bilaterally (fetal origins). There is no aneurysm or AVM. Venous sinuses: Not well evaluated due to bolus timing. Anatomic variants: As above. Review of the MIP images confirms the above findings IMPRESSION: 1. Loss of gray-white differentiation in the insula likely reflecting early infarct. Aspects is 9. 2. Proximal right M2 occlusion with minimal collateral flow throughout the MCA distribution. 3. Patent vasculature of the neck with no hemodynamically significant stenosis or occlusion. Electronically Signed   By: Valetta Mole M.D.   On: 09/24/2022 14:06   CT ANGIO HEAD NECK W WO CM (CODE STROKE)  Result Date: 09/24/2022 CLINICAL DATA:  Left-sided weakness and left gaze deviation. EXAM: CT ANGIOGRAPHY HEAD AND NECK TECHNIQUE: Multidetector CT imaging of the head and neck was performed using the standard protocol during bolus administration of intravenous contrast. Multiplanar CT image reconstructions and MIPs were obtained to evaluate the vascular anatomy. Carotid  stenosis measurements (when applicable) are obtained utilizing NASCET criteria, using the distal internal carotid diameter as the denominator. RADIATION DOSE REDUCTION: This exam was performed according to the departmental dose-optimization program which includes automated exposure control, adjustment of the mA and/or kV according to patient size and/or use of iterative reconstruction technique. CONTRAST:  12m OMNIPAQUE IOHEXOL 350 MG/ML SOLN COMPARISON:  Brain MRI 11/07/2021 FINDINGS: CT HEAD FINDINGS Brain: There is questionable loss of gray-white differentiation in the posterior insula suspicious for early infarct in the MCA distribution. The remainder of the MCA territory appears spared at this time. ASPECTS is 9. There is no acute intracranial hemorrhage or extra-axial fluid collection. Background parenchymal volume is normal. The ventricles are normal in size. Probable prominent perivascular spaces  are noted in the bilateral basal ganglia. The pituitary and suprasellar region are normal. There is no mass lesion. There is no mass effect or midline shift. Vascular: See below. Skull: Normal. Negative for fracture or focal lesion. Sinuses/Orbits: There is mild mucosal thickening in the paranasal sinuses. There is a rightward gaze deviation. Other: None. Review of the MIP images confirms the above findings CTA NECK FINDINGS Aortic arch: The imaged aortic arch is normal. The origins of the major branch vessels are patent. The subclavian arteries are patent to the level imaged. Right carotid system: The right common, internal, and external carotid arteries are patent with mild plaque in the common carotid artery and at the bifurcation but no hemodynamically significant stenosis or occlusion. There is no evidence of dissection or aneurysm. Left carotid system: The left common, internal, and external carotid arteries are patent with mild plaque at the bifurcation but no hemodynamically significant stenosis or  occlusion. There is no evidence of dissection or aneurysm Vertebral arteries: The vertebral arteries are patent, without hemodynamically significant stenosis or occlusion. There is no evidence of dissection or aneurysm. Skeleton: There is no acute osseous abnormality or suspicious osseous lesion. There is no visible canal hematoma. Other neck: The soft tissues of the neck are unremarkable. Upper chest: There is an incompletely imaged right pleural effusion. The imaged lung apices are otherwise clear. Review of the MIP images confirms the above findings CTA HEAD FINDINGS Anterior circulation: The intracranial ICAs are patent. The right M1 segment is patent. Small patent MCA branches are seen in the region of the frontal operculum; however, there is essentially no other collateral flow in the remainder of the MCA distribution. The left M1 segment and distal branches are patent, without proximal stenosis or occlusion. The left A1 segment is relatively hypoplastic, likely developmental. The dominant right A1 segment is patent. The distal ACA branches are patent, without proximal stenosis or occlusion. There is no aneurysm or AVM. Posterior circulation: The bilateral V4 segments are patent. The basilar artery is patent, diminutive in caliber. The bilateral PCAs are patent, supplied by posterior communicating arteries bilaterally (fetal origins). There is no aneurysm or AVM. Venous sinuses: Not well evaluated due to bolus timing. Anatomic variants: As above. Review of the MIP images confirms the above findings IMPRESSION: 1. Loss of gray-white differentiation in the insula likely reflecting early infarct. Aspects is 9. 2. Proximal right M2 occlusion with minimal collateral flow throughout the MCA distribution. 3. Patent vasculature of the neck with no hemodynamically significant stenosis or occlusion. Electronically Signed   By: Valetta Mole M.D.   On: 09/24/2022 14:06    Cardiac Studies     Patient Profile     47  y.o. 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%) felt to be nonischemic cardiomyopathy, paroxysmal atrial fibrillation and history of LV thrombus on Xarelto, type 2 DM, anemia, HTN  who is being seen 09/23/2022 for the evaluation of CHF and elevated troponin   Assessment & Plan    Principal Problem:   Acute on chronic combined systolic (congestive) and diastolic (congestive) heart failure (HCC) Active Problems:   Poorly controlled type 2 diabetes mellitus ([place])   Hypertension associated with diabetes (HCC)   Chronic nausea   Paroxysmal atrial fibrillation (HCC)   Elevated troponin   Hypokalemia   LV (left ventricular) mural thrombus   Anasarca   Middle cerebral artery embolism, right   Acute on Chronic combined systolic and diastolic heart failure  Nonischemic cardiomyopathy  Acute ischemic stroke - Most recent echocardiogram from 02/23/22 showed EF <20%, grade III diastolic dysfunction, moderately reduced RV systolic function, mildly elevated pulmonary artery systolic pressure  - Cardiac MRI 08/2021 showed mildly dilated LV with EF 23%, moderate RV systolic dysfunction. There was no evidence of prior MI, infiltrative disease, myocarditis.  - R/L heart catheterization from 08/2021 showed mild, nonobstructive CAD - Patient has been admitted several times in the past for CHF exacerbation/fluid overload. Often in the setting of medication noncompliance or failure to follow low sodium diet  - Now, patient presented complaining of lower extremity edema, shortness of breath  - BNP elevated to 688. CXR with cardiomegaly and mild pulmonary vascular congestion  - Continue IV lasix, dose has been reduced to 60 mg IV BID, ok to observe at this dose.  - losartan 50 mg daily, Toprol-XL 25 mg daily, spironolactone 25 mg daily, jardiance 10 mg daily- these have all been held in setting of stroke.  - hypotensive while on sedation, BP improved as propofol stopped. No longer  requiring phenylephrine. -Patient has not tolerated entresto in the past due to hypotension  - Strict I/Os, daily weights, daily BMPs  - F/u echo - this will be obtained today with definity contrast if able. - Maintain K greater than 4, mag greater than 2   Elevated Troponin  - hsTn 197>170  - Suspect this is demand ischemia in the setting of CHF  - Patient does complain of chest pain, but symptoms are very vague. Troponin trend is not consistent with ACS and patient had clean coronaries on cath from 08/2021. No plans for ischemic evaluation at this time    LV thrombus  Subtherapeutic INR on coumadin  - Echocardiogram from 02/2022 showed an LV thrombus present at apex measuring 2.2 x 1.4 with some mobility  - Patient was on Xarelto at one point, but has more recently been on coumadin  - INR subtherapeutic at 1.3 on 09/22/22. -Given her noncompliance with Coumadin clinic appointments, would recommend switching from Coumadin to Eliquis - INR subtherapeutic, now with acute embolic stroke. Concerning for involvement of known prior LV thrombus, with nonadherence to warfarin.  - was on IV heparin until stroke. Defer to stroke team on timing of resuming anticoagulation.   Paroxysmal Atrial Fibrillation  - Patient is in sinus tachycardia with HR 100-110 - Resume home metoprolol succinate 25 mg daily as above  - Patient was on coumadin, INR subtherapeutic at 1.3. As above.   Mild Mitral Valve regurgitation  Mild-moderate tricuspid valve regurgitation  - Echocardiogram this admission pending   Otherwise per primary  - Type 2 DM, poorly controlled  - Chronic nausea    For questions or updates, please contact [place] Please consult [email or web address] for contact info under   CRITICAL CARE Performed by: Cherlynn Kaiser, MD   Total critical care time: 35 minutes   Critical care time was exclusive of separately billable procedures and treating other patients.   Critical care was  necessary to treat or prevent imminent or life-threatening deterioration.   Critical care was time spent personally by me (independent of APPs or residents) on the following activities: development of treatment plan with patient and/or surrogate as well as nursing, discussions with consultants, evaluation of patient's response to treatment, examination of patient, obtaining history from patient or surrogate, ordering and performing treatments and interventions, ordering and review of laboratory studies, ordering and review of radiographic studies, pulse oximetry and re-evaluation of patient's condition.  Signed, Elouise Munroe, MD  09/25/2022, 8:49 AM

## 2022-09-25 NOTE — Consult Note (Signed)
NAME:  [name], MRN:  [id number], DOB:  01-May-1976, LOS: 2 ADMISSION DATE:  09/22/2022, CONSULTATION DATE:  09/24/2022 REFERRING MD:  Dr. Allegra Grana, CHIEF COMPLAINT:  Vent management   History of Present Illness:  [name] is a 47 y.o. with an extensive PMH that includes but not limited to Acute combined systolic and diastolic congestive heart failure, history of LV thrombus, prior NSTEMMI, prior PE, A-fib, type 2 diabetes, HTN, depression, and OSA who presented 3/1 with 2 week history of lower extremity edema, shortness of breath, and chest pain. Patient was admitted per FMTS for management of acute CHF exacerbation and possible NSTEMI  Afternoon of 3/2 patient suffered acute change in mental status resulting in code stroke being called.  This is in the setting of subtherapeutic INR of 1.3 despite anticoagulation with Coumadin on admission with history of chronic LV thrombus and paroxysmal atrial fibrillation.  CTA head revealed likely evolving stroke with proximal right M2 occlusion with minimal collateral flow throughout the MCA distribution.  Neurology and interventional neurology consulted and patient was taken urgently for thrombectomy.  Postoperatively patient remained on ventilator prompting PCCM consult.  Pertinent  Medical History  Includes but not limited to: Acute combined systolic and diastolic congestive heart failure, history of LV thrombus, prior NSTEMMI, prior PE, A-fib, type 2 diabetes, HTN, depression, and OSA  Significant Hospital Events: Including procedures, antibiotic start and stop dates in addition to other pertinent events   3/2 Sedated on vent   Interim History / Subjective:   No acute events overnight  Objective   Blood pressure (!) 128/95, pulse 74, temperature 97.7 F (36.5 C), resp. rate 20, height '4\' 11"'$  (1.499 m), weight 92.6 kg, last menstrual period 06/22/2022, SpO2 100 %.    Vent Mode: PRVC FiO2 (%):  [40 %-50 %] 40 % Set Rate:  [18 bmp] 18  bmp Vt Set:  [340 mL] 340 mL PEEP:  [5 cmH20] 5 cmH20 Plateau Pressure:  [16 cmH20-20 cmH20] 16 cmH20   Intake/Output Summary (Last 24 hours) at 09/25/2022 0720 Last data filed at 09/25/2022 0700 Gross per 24 hour  Intake 1000.04 ml  Output 2025 ml  Net -1024.96 ml   Filed Weights   09/24/22 0500 09/24/22 2000 09/25/22 0500  Weight: 89.3 kg 93.4 kg 92.6 kg    Examination: General: Acute on chronically ill appearing adult female lying in bed on mechanical ventilation, in NAD, obese HEENT: ETT, MM pink/moist, PERRL 25m Neuro: PERRL sluggish, cough/gag reflex present CV: s1s2 regular rate and rhythm, no murmur, rubs, or gallops,  PULM:  Clear to auscultation, no increased work of breathing, no added breath sounds GI: soft, bowel sounds active in all 4 quadrants, non-tender, non-distended Extremities: warm/dry, 1+ edema  Skin: no rashes or lesions  Resolved Hospital Problem list     Assessment & Plan:  Postoperative ventilator management  -Remained intubated post thrombectomy  P: Continue ventilator support with lung protective strategies  Wean PEEP and FiO2 for sats greater than 90%. Head of bed elevated 30 degrees. Plateau pressures less than 30 cm H20.  Follow intermittent chest x-ray and ABG.   SAT/SBT as tolerated Ensure adequate pulmonary hygiene  Follow cultures  VAP bundle in place  PAD protocol  Acute CVA  -Acute neuro change seen in hospital with code stroke called.  CTA head revealed likely evolving stroke with proximal right M2 occlusion with minimal collateral flow throughout the MCA distribution.  Neurology and interventional neurology consulted and patient was taken urgently  for thrombectom P: Primary management per neurology  Maintain neuro protective measures; goal for eurothermia, euglycemia, eunatermia, normoxia, and PCO2 goal of 35-40 Nutrition and bowel regiment  Seizure precautions  Aspirations precautions  Secondary stroke preventions BP parameters  per interventional neurology   In hospital cardiac arrest -On induction for thrombectomy patient lost blood pressure follow by lose of pulses, 49mns of ACLS protocol provided prior to ROSC P: Continuous telemetry Continue pressors for MAP goal >65  Acute on chronic combined systolic and diastolic congestive heart failure  Nonischemic cardiomyopathy  -Most recent ECHO August 2023 with ED < 20% and grade III diastolic dysfunction  Elevated Troponin  P: Primary management per cardiology  Continuous telemetry  Strict intake and output  Daily weight to assess volume status Continue IV lasix once daily today Hold Losartan, Toprol-XL, Spironolactone, and Jardance Closely monitor renal function and electrolytes  Vent support as above  Optimize electrolytes   Chronic LV thrombus  Subtherapeutic INR on Coumadin -ECHO August 2023 with 2.2 x 1.4LV thrombus at the apex with some mobility -INR on admit 1.3 P: Plan to resume heparin today, will confirm with neurology  Paroxysmal Atrial Fibrillation  P: Anticoagulation as above  Continuous telemetry  Type 2 diabetes A1C 10.8 on admit  P: SSI  CBG goal 140-180  Hypokalemia P: Trend Bmet Supplement as needed    Best Practice (right click and "Reselect all SmartList Selections" daily)   Diet/type: NPO DVT prophylaxis: SCD GI prophylaxis: PPI Lines: Central line Foley:  Yes, and it is still needed Code Status:  full code Last date of multidisciplinary goals of care discussion: Pending   Labs   CBC: Recent Labs  Lab 09/22/22 2050 09/23/22 0514 09/24/22 0130 09/25/22 0508  WBC 8.7 7.5 6.7 7.9  NEUTROABS 6.0  --   --   --   HGB 13.0 12.4 12.3 12.5  HCT 41.3 39.6 39.4 38.5  MCV 71.1* 71.4* 71.0* 70.8*  PLT 232 228 213 [id number]   Basic Metabolic Panel: Recent Labs  Lab 09/22/22 2050 09/22/22 2103 09/23/22 1218 09/24/22 0130 09/24/22 2231  NA 133*  --  133* 133* 135  K 3.2*  --  3.6 3.3* 4.1  CL 95*  --  96* 96*  96*  CO2 26  --  '22 28 27  '$ GLUCOSE 245*  --  267* 98 151*  BUN 15  --  '14 12 12  '$ CREATININE 1.01* 1.00 0.98 1.04* 1.08*  CALCIUM 8.1*  --  8.1* 8.4* 8.6*  MG  --   --  1.3* 1.6* 1.9   GFR: Estimated Creatinine Clearance: 66.1 mL/min (A) (by C-G formula based on SCr of 1.08 mg/dL (H)). Recent Labs  Lab 09/22/22 2050 09/23/22 0514 09/24/22 0130 09/25/22 0508  WBC 8.7 7.5 6.7 7.9    Liver Function Tests: No results for input(s): "AST", "ALT", "ALKPHOS", "BILITOT", "PROT", "ALBUMIN" in the last 168 hours. No results for input(s): "LIPASE", "AMYLASE" in the last 168 hours. No results for input(s): "AMMONIA" in the last 168 hours.  ABG    Component Value Date/Time   PHART 7.451 (H) 09/17/2021 2204   PCO2ART 31.6 (L) 09/17/2021 2204   PO2ART 114 (H) 09/17/2021 2204   HCO3 28.1 (H) 09/22/2022 2128   TCO2 33 (H) 09/20/2021 0840   TCO2 32 09/20/2021 0840   ACIDBASEDEF 0.8 10/18/2021 0750   O2SAT 80.3 09/22/2022 2128     Coagulation Profile: Recent Labs  Lab 09/22/22 2134  INR 1.3*    Cardiac  Enzymes: No results for input(s): "CKTOTAL", "CKMB", "CKMBINDEX", "TROPONINI" in the last 168 hours.  HbA1C: HbA1c, POC (controlled diabetic range)  Date/Time Value Ref Range Status  08/31/2021 11:20 AM 13.2 (A) 0.0 - 7.0 % Final  12/11/2019 10:39 AM 9.3 (A) 0.0 - 7.0 % Final   Hgb A1c MFr Bld  Date/Time Value Ref Range Status  09/23/2022 12:18 PM 10.8 (H) 4.8 - 5.6 % Final    Comment:    (NOTE)         Prediabetes: 5.7 - 6.4         Diabetes: >6.4         Glycemic control for adults with diabetes: <7.0   06/02/2022 09:36 PM >15.5 (H) 4.8 - 5.6 % Final    Comment:    (NOTE)         Prediabetes: 5.7 - 6.4         Diabetes: >6.4         Glycemic control for adults with diabetes: <7.0     CBG: Recent Labs  Lab 09/23/22 2121 09/24/22 0620 09/24/22 1210 09/24/22 1302 09/24/22 1825  GLUCAP 132* 133* 168* 172* 133*       Critical care time:   CRITICAL  CARE Performed by: Freddi Starr   Total critical care time: 40 minutes  Critical care time was exclusive of separately billable procedures and treating other patients.  Critical care was necessary to treat or prevent imminent or life-threatening deterioration.  Critical care was time spent personally by me on the following activities: development of treatment plan with patient and/or surrogate as well as nursing, discussions with consultants, evaluation of patient's response to treatment, examination of patient, obtaining history from patient or surrogate, ordering and performing treatments and interventions, ordering and review of laboratory studies, ordering and review of radiographic studies, pulse oximetry and re-evaluation of patient's condition.  Freda Jackson, MD [facility] Office: [phone]   See Amion for personal pager PCCM on call pager [phone] until 7pm. Please call Elink 7p-7a. [phone]

## 2022-09-25 NOTE — Progress Notes (Signed)
ANTICOAGULATION CONSULT NOTE - Initial Consult  Pharmacy Consult for Heparin Indication:  LV Thrombus  Allergies  Allergen Reactions   Fish Allergy Swelling   Shellfish Allergy Anaphylaxis, Swelling and Other (See Comments)    Airway involvement including EMS - Has Epi-Pen   Metformin And Related Diarrhea   Trulicity [Dulaglutide] Nausea And Vomiting and Other (See Comments)    Multiple episodes of vomiting over multiple days   Nsaids Other (See Comments)    Per PCP interferes with daily meds (heart failure)   Advil [Ibuprofen] Other (See Comments)    Per PCP interferes with daily meds (heart failure)    Patient Measurements: Height: '4\' 11"'$  (149.9 cm) Weight: 92.6 kg (204 lb 2.3 oz) IBW/kg (Calculated) : 43.2 Heparin Dosing Weight: 65.6 kg  Vital Signs: Temp: 98.4 F (36.9 C) (03/03 1330) BP: 138/108 (03/03 1600) Pulse Rate: 76 (03/03 1615)  Labs: Recent Labs    09/22/22 2050 09/22/22 2103 09/22/22 2134 09/23/22 0514 09/23/22 1218 09/23/22 1455 09/24/22 0130 09/24/22 2231 09/25/22 0508  HGB 13.0  --   --  12.4  --   --  12.3  --  12.5  HCT 41.3  --   --  39.6  --   --  39.4  --  38.5  PLT 232  --   --  228  --   --  213  --  234  LABPROT  --   --  16.0*  --   --   --   --   --   --   INR  --   --  1.3*  --   --   --   --   --   --   HEPARINUNFRC  --   --   --  0.24*  --  0.51 0.47  --   --   CREATININE 1.01*   < >  --   --    < >  --  1.04* 1.08* 1.14*  TROPONINIHS 197*  --  170*  --   --   --   --   --   --    < > = values in this interval not displayed.    Estimated Creatinine Clearance: 62.6 mL/min (A) (by C-G formula based on SCr of 1.14 mg/dL (H)).   Medical History: Past Medical History:  Diagnosis Date   Abnormal Pap smear    Acute combined systolic and diastolic heart failure ([place]) 02/19/2019   Wt Readings from Last 3 Encounters:  09/18/21  84.7 kg  09/02/21  84.4 kg  08/31/21  84.2 kg         Acute heart failure ([place]) 01/29/2022   Acute on  chronic combined systolic and diastolic CHF (congestive heart failure) ([place]) 02/19/2019   Acute on chronic congestive heart failure (H[facility])    Acute on chronic congestive heart failure (H[facility])    Acute on chronic diastolic CHF (congestive heart failure) ([place]) 10/19/2021   Acute on chronic systolic heart failure ([place]) 02/22/2022   Anemia    Asthma    Atrial fibrillation ([place])    history of paroxysmal atrial fibrillation   CHF (congestive heart failure) ([place]) 11/02/2021   CHF exacerbation ([place]) 10/17/2021   Diabetes mellitus    DKA, type 2 ([place]) 02/06/2019   Elevated troponin 09/30/2019   Heart failure with reduced ejection fraction ([place])    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     Migraines    Morbid obesity ([place]) 02/06/2019   Nausea & vomiting 06/26/2022   Nonischemic cardiomyopathy ([place]) 09/22/2021   NSTEMI (non-ST elevated myocardial infarction) ([place]) 02/23/2022   Obstructive sleep apnea 06/30/2015   Polycystic ovarian disease     Assessment:  47 y.o. 2 week history of lower extremity edema, shortness of breath, and chest pain. Patient developed stroke as inpatient. Now found to have an LV Thrombus.  Pharmacy consulted to start heparin infusion for LV thrombus using the stroke protocol (no bolus).  Patient on warfarin PTA.  Goal of Therapy:  Heparin level 0.3-0.5 units/ml Monitor platelets by anticoagulation protocol: Yes   Plan:  Start heparin infusion at 800 units/hr Check anti-Xa level in 6 hours and daily while on heparin Continue to monitor H&H and platelets  Alanda Slim, PharmD, [facility] Clinical  Pharmacist Please see AMION for all Pharmacists' Contact Phone Numbers 09/25/2022, 4:24 PM

## 2022-09-25 NOTE — Progress Notes (Signed)
Pharmacist called to inform 2300 heparin level of 0.13.

## 2022-09-25 NOTE — Progress Notes (Signed)
Pt transported to and from MRI on the ventilator without incident. 

## 2022-09-25 NOTE — Progress Notes (Addendum)
STROKE TEAM PROGRESS NOTE   INTERVAL HISTORY Her daughters are at the bedside.  Assessment and plan of care reviewed with both at bedside.  Patient was admitted for shortness of breath secondary to CHF, found to have an LV thrombus and on heparin drip.  3/2 heparin drip was stopped and neurology was consulted for code stroke due to left-sided weakness.  CT showed early infarct with aspects of 9.  Patient was not a candidate for TNK as she was on heparin drip, NIH stroke scale was 16 CT angio showed proximal right M1 to occlusion and patient was taken to IR for emergent procedure, TICI2B revascularization. Remained intubated post-procedure.   Daughter described that mother is "secretive" about her medications and when she takes them.  They do comment that mom is often out of breath at home and that she has trouble walking up the 6 steps in their home without taking a break.  They said she often sleeps in the recliner downstairs.  On exam, intubated but off sedation. Opens eyes to voice, able to briefly have complete horizontal gaze but can not maintain, inconsistent blink to threat, LUE flaccid.   3/3 MRI negative for hemorrhage.  Will restart heparin drip.  Will plan for spontaneous breathing trial and extubation early tomorrow a.m.   Vitals:   09/25/22 0645 09/25/22 0700 09/25/22 0715 09/25/22 0734  BP:  (!) 128/95    Pulse: 73 74 74   Resp: (!) 7 (!) 0 20 18  Temp: 97.7 F (36.5 C) 97.7 F (36.5 C) 97.7 F (36.5 C)   TempSrc:      SpO2: 98% 97% 100%   Weight:      Height:       CBC:  Recent Labs  Lab 09/22/22 2050 09/23/22 0514 09/24/22 0130 09/25/22 0508  WBC 8.7   < > 6.7 7.9  NEUTROABS 6.0  --   --   --   HGB 13.0   < > 12.3 12.5  HCT 41.3   < > 39.4 38.5  MCV 71.1*   < > 71.0* 70.8*  PLT 232   < > 213 234   < > = values in this interval not displayed.   Basic Metabolic Panel:  Recent Labs  Lab 09/24/22 0130 09/24/22 2231  NA 133* 135  K 3.3* 4.1  CL 96* 96*   CO2 28 27  GLUCOSE 98 151*  BUN 12 12  CREATININE 1.04* 1.08*  CALCIUM 8.4* 8.6*  MG 1.6* 1.9   Lipid Panel:  Recent Labs  Lab 09/24/22 0130 09/25/22 0508  CHOL 136  --   TRIG 62 116  HDL 27*  --   CHOLHDL 5.0  --   VLDL 12  --   LDLCALC 97  --    HgbA1c:  Recent Labs  Lab 09/23/22 1218  HGBA1C 10.8*   Urine Drug Screen: No results for input(s): "LABOPIA", "COCAINSCRNUR", "LABBENZ", "AMPHETMU", "THCU", "LABBARB" in the last 168 hours.  Alcohol Level No results for input(s): "ETH" in the last 168 hours.  IMAGING past 24 hours DG Abd Portable 1V  Result Date: 09/24/2022 CLINICAL DATA:  OG tube placement EXAM: PORTABLE ABDOMEN - 1 VIEW COMPARISON:  None Available. FINDINGS: Enteric tube terminates in the gastric antrum. IMPRESSION: Enteric tube terminates in the gastric antrum. Electronically Signed   By: Julian Hy M.D.   On: 09/24/2022 20:23   DG CHEST PORT 1 VIEW  Result Date: 09/24/2022 CLINICAL DATA:  Status post intubation. EXAM: PORTABLE  CHEST 1 VIEW COMPARISON:  09/22/2022. FINDINGS: Endotracheal tube tip is just above the carina by approximately 7 mm and is directed towards the right mainstem bronchus. Right IJ catheter tip is in the superior cavoatrial junction. No pneumothorax after catheter placement. Unchanged cardiac enlargement. No significant pleural fluid or airspace disease. Platelike atelectasis noted in the left midlung. IMPRESSION: 1. Low position of endotracheal tube with tip just above the carina and directed towards the right mainstem bronchus. Recommend retraction by 1 cm. 2. Right IJ catheter tip in the superior cavoatrial junction. 3. Cardiac enlargement. These results will be called to the ordering clinician or representative by the Radiologist Assistant, and communication documented in the PACS or [employer]. Electronically Signed   By: Kerby Moors M.D.   On: 09/24/2022 17:50   CT HEAD WO CONTRAST (5MM)  Result Date: 09/24/2022 CLINICAL  DATA:  Left-sided weakness and left gaze deviation. EXAM: CT ANGIOGRAPHY HEAD AND NECK TECHNIQUE: Multidetector CT imaging of the head and neck was performed using the standard protocol during bolus administration of intravenous contrast. Multiplanar CT image reconstructions and MIPs were obtained to evaluate the vascular anatomy. Carotid stenosis measurements (when applicable) are obtained utilizing NASCET criteria, using the distal internal carotid diameter as the denominator. RADIATION DOSE REDUCTION: This exam was performed according to the departmental dose-optimization program which includes automated exposure control, adjustment of the mA and/or kV according to patient size and/or use of iterative reconstruction technique. CONTRAST:  49m OMNIPAQUE IOHEXOL 350 MG/ML SOLN COMPARISON:  Brain MRI 11/07/2021 FINDINGS: CT HEAD FINDINGS Brain: There is questionable loss of gray-white differentiation in the posterior insula suspicious for early infarct in the MCA distribution. The remainder of the MCA territory appears spared at this time. ASPECTS is 9. There is no acute intracranial hemorrhage or extra-axial fluid collection. Background parenchymal volume is normal. The ventricles are normal in size. Probable prominent perivascular spaces are noted in the bilateral basal ganglia. The pituitary and suprasellar region are normal. There is no mass lesion. There is no mass effect or midline shift. Vascular: See below. Skull: Normal. Negative for fracture or focal lesion. Sinuses/Orbits: There is mild mucosal thickening in the paranasal sinuses. There is a rightward gaze deviation. Other: None. Review of the MIP images confirms the above findings CTA NECK FINDINGS Aortic arch: The imaged aortic arch is normal. The origins of the major branch vessels are patent. The subclavian arteries are patent to the level imaged. Right carotid system: The right common, internal, and external carotid arteries are patent with mild  plaque in the common carotid artery and at the bifurcation but no hemodynamically significant stenosis or occlusion. There is no evidence of dissection or aneurysm. Left carotid system: The left common, internal, and external carotid arteries are patent with mild plaque at the bifurcation but no hemodynamically significant stenosis or occlusion. There is no evidence of dissection or aneurysm Vertebral arteries: The vertebral arteries are patent, without hemodynamically significant stenosis or occlusion. There is no evidence of dissection or aneurysm. Skeleton: There is no acute osseous abnormality or suspicious osseous lesion. There is no visible canal hematoma. Other neck: The soft tissues of the neck are unremarkable. Upper chest: There is an incompletely imaged right pleural effusion. The imaged lung apices are otherwise clear. Review of the MIP images confirms the above findings CTA HEAD FINDINGS Anterior circulation: The intracranial ICAs are patent. The right M1 segment is patent. Small patent MCA branches are seen in the region of the frontal operculum; however, there is  essentially no other collateral flow in the remainder of the MCA distribution. The left M1 segment and distal branches are patent, without proximal stenosis or occlusion. The left A1 segment is relatively hypoplastic, likely developmental. The dominant right A1 segment is patent. The distal ACA branches are patent, without proximal stenosis or occlusion. There is no aneurysm or AVM. Posterior circulation: The bilateral V4 segments are patent. The basilar artery is patent, diminutive in caliber. The bilateral PCAs are patent, supplied by posterior communicating arteries bilaterally (fetal origins). There is no aneurysm or AVM. Venous sinuses: Not well evaluated due to bolus timing. Anatomic variants: As above. Review of the MIP images confirms the above findings IMPRESSION: 1. Loss of gray-white differentiation in the insula likely reflecting  early infarct. Aspects is 9. 2. Proximal right M2 occlusion with minimal collateral flow throughout the MCA distribution. 3. Patent vasculature of the neck with no hemodynamically significant stenosis or occlusion. Electronically Signed   By: Valetta Mole M.D.   On: 09/24/2022 14:06   CT ANGIO HEAD NECK W WO CM (CODE STROKE)  Result Date: 09/24/2022 CLINICAL DATA:  Left-sided weakness and left gaze deviation. EXAM: CT ANGIOGRAPHY HEAD AND NECK TECHNIQUE: Multidetector CT imaging of the head and neck was performed using the standard protocol during bolus administration of intravenous contrast. Multiplanar CT image reconstructions and MIPs were obtained to evaluate the vascular anatomy. Carotid stenosis measurements (when applicable) are obtained utilizing NASCET criteria, using the distal internal carotid diameter as the denominator. RADIATION DOSE REDUCTION: This exam was performed according to the departmental dose-optimization program which includes automated exposure control, adjustment of the mA and/or kV according to patient size and/or use of iterative reconstruction technique. CONTRAST:  41m OMNIPAQUE IOHEXOL 350 MG/ML SOLN COMPARISON:  Brain MRI 11/07/2021 FINDINGS: CT HEAD FINDINGS Brain: There is questionable loss of gray-white differentiation in the posterior insula suspicious for early infarct in the MCA distribution. The remainder of the MCA territory appears spared at this time. ASPECTS is 9. There is no acute intracranial hemorrhage or extra-axial fluid collection. Background parenchymal volume is normal. The ventricles are normal in size. Probable prominent perivascular spaces are noted in the bilateral basal ganglia. The pituitary and suprasellar region are normal. There is no mass lesion. There is no mass effect or midline shift. Vascular: See below. Skull: Normal. Negative for fracture or focal lesion. Sinuses/Orbits: There is mild mucosal thickening in the paranasal sinuses. There is a  rightward gaze deviation. Other: None. Review of the MIP images confirms the above findings CTA NECK FINDINGS Aortic arch: The imaged aortic arch is normal. The origins of the major branch vessels are patent. The subclavian arteries are patent to the level imaged. Right carotid system: The right common, internal, and external carotid arteries are patent with mild plaque in the common carotid artery and at the bifurcation but no hemodynamically significant stenosis or occlusion. There is no evidence of dissection or aneurysm. Left carotid system: The left common, internal, and external carotid arteries are patent with mild plaque at the bifurcation but no hemodynamically significant stenosis or occlusion. There is no evidence of dissection or aneurysm Vertebral arteries: The vertebral arteries are patent, without hemodynamically significant stenosis or occlusion. There is no evidence of dissection or aneurysm. Skeleton: There is no acute osseous abnormality or suspicious osseous lesion. There is no visible canal hematoma. Other neck: The soft tissues of the neck are unremarkable. Upper chest: There is an incompletely imaged right pleural effusion. The imaged lung apices are otherwise clear.  Review of the MIP images confirms the above findings CTA HEAD FINDINGS Anterior circulation: The intracranial ICAs are patent. The right M1 segment is patent. Small patent MCA branches are seen in the region of the frontal operculum; however, there is essentially no other collateral flow in the remainder of the MCA distribution. The left M1 segment and distal branches are patent, without proximal stenosis or occlusion. The left A1 segment is relatively hypoplastic, likely developmental. The dominant right A1 segment is patent. The distal ACA branches are patent, without proximal stenosis or occlusion. There is no aneurysm or AVM. Posterior circulation: The bilateral V4 segments are patent. The basilar artery is patent, diminutive  in caliber. The bilateral PCAs are patent, supplied by posterior communicating arteries bilaterally (fetal origins). There is no aneurysm or AVM. Venous sinuses: Not well evaluated due to bolus timing. Anatomic variants: As above. Review of the MIP images confirms the above findings IMPRESSION: 1. Loss of gray-white differentiation in the insula likely reflecting early infarct. Aspects is 9. 2. Proximal right M2 occlusion with minimal collateral flow throughout the MCA distribution. 3. Patent vasculature of the neck with no hemodynamically significant stenosis or occlusion. Electronically Signed   By: Valetta Mole M.D.   On: 09/24/2022 14:06    PHYSICAL EXAM  Temp:  [94.3 F (34.6 C)-98.6 F (37 C)] 98.4 F (36.9 C) (03/03 1330) Pulse Rate:  [67-110] 77 (03/03 1600) Resp:  [0-28] 24 (03/03 1600) BP: (110-146)/(80-111) 138/108 (03/03 1600) SpO2:  [95 %-100 %] 98 % (03/03 1600) Arterial Line BP: (106-147)/(68-108) 139/99 (03/03 1600) FiO2 (%):  [40 %-50 %] 40 % (03/03 1415) Weight:  [92.6 kg-93.4 kg] 92.6 kg (03/03 0500)   General - Well nourished, well developed, intubated off sedation.   Ophthalmologic - fundi not visualized due to noncooperation.   Cardiovascular - Regular rate and rhythm, not in afib.   Neuro - intubated off sedation, eyes open on voice, following most midline commands and one peripheral commands on the right hand.  With eye opening, eyes in mid position, able to have bilateral brief horizontal gaze but not able to maintain gazing position, not consistently blinking to visual threat, not actively tracking, PERRL.  Corneal reflex , gag and cough . Breathing over the vent.  Facial symmetry not able to test due to ET tube.  Tongue protrusion not cooperative.  Spontaneous movement of RUE against gravity, LUE flaccid, BLEs slight withdraw to pain.  Sensation, coordination and gait not tested.   ASSESSMENT/PLAN [name] is a 47 y.o. female with extensive PMH  that includes but not limited to Acute combined systolic and diastolic congestive heart failure, history of LV thrombus, prior NSTEMMI, prior PE, A-fib, type 2 diabetes, HTN, depression, and OSA who presented 3/1 with 2 week history of lower extremity edema, shortness of breath, and chest pain.  Neurology was consulted for code stroke 3/2 due to left-sided weakness.  Patient underwent IR procedure due to right M2 occlusion she remained intubated postprocedure.  Stroke workup as detailed below.  3/3 MRI negative for hemorrhage, will restart heparin drip.  Will plan for SBT and hopeful extubation early tomorrow a.m.  Appreciate CCM assistance with this.  Stroke - right MCA infarct with right M2 occlusion, s/p IR with TICI2b, etiology likely embolic from known LV thrombus, cardiomyopathy with low EF and PAF even on heparin IV Code Stroke CT head: Loss of gray-white differentiation in the insula likely reflecting early infarct.  Aspects is 9 CTA head & neck:  Proximal right M2 occlusion with minimal collateral flow throughout the MCA distribution. IR - Occluded inferior division of the right middle cerebral artery in the mid M2 segment. TICI 2B revascularization.  Post IR CT: no evidence of intracranial hemorrhage.  MRI: Large area of acute infarct in the right MCA distribution, smaller area of acute infarct in the right ACA distribution, and punctate acute infarct in the left cerebellar hemisphere. No evidence of hemorrhagic transformation or midline shift. MRA head - Persistent occlusion of a right M2 branch with minimal collateral flow in the MCA distribution aside from in the region of the frontal operculum. 2D Echo: Large LV apical thrombus present, LVEF 15% LDL 97 HgbA1c 10.8 VTE prophylaxis - SCDs, Heparin IV  warfarin daily prior to admission (?compliance), now resume heparin IV.  Therapy recommendations:  pending Disposition:  pending  Brief Cardiac Arrest during IR procedure 61mns of ACLS  before ROSC achieved Cardiology/CCM on board  Acute Respiratory Failure Remains intubated Plan for SBT and potential extubation early AM  Appreciate CCM assistance  Acute on chronic combined congestive heart failure Nonischemic cardiomyopathy 2D Echo: Large LV apical thrombus present, LVEF 15% Subtherapeutic INR on admission Resume Heparin gtt Cardiology on board R/L cath 08/2021 showed mild, nonobstructive CAD CXR with cardiomegaly and mild pulmonary vascular congestion. Strict Intake/Output, Foley catheter, daily weights Lasix IV BID, per cardiology Hold Losartan, Toprol-XL, Spironolactone, and Jardiance for now  Paroxysmal Atrial Fibrillation Resume Heparin gtt  Continuous telemetry monitoring  Hx of hypertension hypotension Home meds:  none stable On Neo BP goal 120-180 Long-term BP goal normotensive  Hyperlipidemia Home meds:  none LDL 97, goal < 70 Add lipitor '80mg'$   Continue statin at discharge  Diabetes type II Uncontrolled Home meds:  novolog flexpen HgbA1c 10.8, goal < 7.0 CBG, goal 140-180 SSI Close PCP follow up for better DM control  Other Stroke Risk Factors Obesity, Body mass index is 41.23 kg/m., BMI >/= 30 associated with increased stroke risk, recommend weight loss, diet and exercise as appropriate  Coronary artery disease / NSTEMI Migraines Obstructive sleep apnea  Other Active Problems Hypokalemia, resolved. Goal >4 Hypomagnesia, resolved. [place]>2.  Chronic nausea  Hospital day # 2   Pt seen by Neuro NP/APP and later by MD. Note/plan to be edited by MD as needed.    EOtelia Santee DNP, AGACNP-BC [facility] Please use AMION for pager and EPIC for messaging  ATTENDING NOTE: I reviewed above note and agree with the assessment and plan. Pt was seen and examined.   Pt daughters are at the bedside. Pt intubated off sedation, eyes open on voice, following most midline commands and one peripheral commands on the right hand.  With eye opening, eyes in mid position, able to have bilateral brief horizontal gaze but not able to maintain gazing position, not consistently blinking to visual threat, not actively tracking, PERRL. Corneal reflex , gag and cough . Breathing over the vent.  Facial symmetry not able to test due to ET tube.  Tongue protrusion not cooperative. Spontaneous movement of RUE against gravity, LUE flaccid, BLEs slight withdraw to pain. Sensation, coordination and gait not tested.   Echo today showed EF 15% with large LV thrombus, MRI showed right MCA large infarct but minimal HT, will resume heparin IV per stroke protocol. Appreciate CCM for vent management, likely extubate soon. Still on Neo for BP management.   For detailed assessment and plan, please refer to above/below as I have made changes wherever appropriate.  This patient is critically ill due to right MCA stroke, status post mechanical thrombectomy, LV thrombus, A-fib, cardiomyopathy, respiratory failure and at significant risk of neurological worsening, death form recurrent stroke, hemorrhagic transformation, heart failure, arrhythmia, seizure. This patient's care requires constant monitoring of vital signs, hemodynamics, respiratory and cardiac monitoring, review of multiple databases, neurological assessment, discussion with family, other specialists and medical decision making of high complexity. I spent 45 minutes of neurocritical care time in the care of this patient. I had long discussion with daughters at bedside, updated pt current condition, treatment plan and potential prognosis, and answered all the questions.  They expressed understanding and appreciation.  I also discussed with Dr. Erin Fulling CCM.   Rosalin Hawking, MD PhD Stroke Neurology 09/25/2022 6:39 PM     To contact Stroke Continuity provider, please refer to [email or web address] After hours, contact General Neurology

## 2022-09-25 NOTE — Progress Notes (Signed)
  Echocardiogram 2D Echocardiogram has been performed.  [name] 09/25/2022, 10:36 AM

## 2022-09-25 NOTE — Progress Notes (Signed)
SLP Cancellation Note  Patient Details Name: [name] MRN: [id number] DOB: 11-10-1975   Cancelled treatment:       Reason Eval/Treat Not Completed: Medical issues which prohibited therapy (Pt currently on the vent. SLP will follow up on subsequent date.)  [name] I. [name], [name], [name] Office number [phone]  [name] 09/25/2022, 8:06 AM

## 2022-09-25 NOTE — Addendum Note (Signed)
Addendum  created 09/25/22 0801 by Murvin Natal, MD   Clinical Note Signed

## 2022-09-26 ENCOUNTER — Inpatient Hospital Stay (HOSPITAL_COMMUNITY): Payer: Medicare Other

## 2022-09-26 DIAGNOSIS — I513 Intracardiac thrombosis, not elsewhere classified: Secondary | ICD-10-CM | POA: Diagnosis not present

## 2022-09-26 DIAGNOSIS — I5043 Acute on chronic combined systolic (congestive) and diastolic (congestive) heart failure: Secondary | ICD-10-CM | POA: Diagnosis not present

## 2022-09-26 DIAGNOSIS — I255 Ischemic cardiomyopathy: Secondary | ICD-10-CM

## 2022-09-26 DIAGNOSIS — I63411 Cerebral infarction due to embolism of right middle cerebral artery: Secondary | ICD-10-CM | POA: Diagnosis not present

## 2022-09-26 LAB — CBC
HCT: 37.1 % (ref 36.0–46.0)
Hemoglobin: 12.1 g/dL (ref 12.0–15.0)
MCH: 22.9 pg — ABNORMAL LOW (ref 26.0–34.0)
MCHC: 32.6 g/dL (ref 30.0–36.0)
MCV: 70.1 fL — ABNORMAL LOW (ref 80.0–100.0)
Platelets: 233 10*3/uL (ref 150–400)
RBC: 5.29 MIL/uL — ABNORMAL HIGH (ref 3.87–5.11)
RDW: 24.5 % — ABNORMAL HIGH (ref 11.5–15.5)
WBC: 10.3 10*3/uL (ref 4.0–10.5)
nRBC: 0.2 % (ref 0.0–0.2)

## 2022-09-26 LAB — GLUCOSE, CAPILLARY
Glucose-Capillary: 151 mg/dL — ABNORMAL HIGH (ref 70–99)
Glucose-Capillary: 130 mg/dL — ABNORMAL HIGH (ref 70–99)
Glucose-Capillary: 156 mg/dL — ABNORMAL HIGH (ref 70–99)
Glucose-Capillary: 142 mg/dL — ABNORMAL HIGH (ref 70–99)
Glucose-Capillary: 137 mg/dL — ABNORMAL HIGH (ref 70–99)
Glucose-Capillary: 129 mg/dL — ABNORMAL HIGH (ref 70–99)

## 2022-09-26 LAB — BASIC METABOLIC PANEL
Anion gap: 14 (ref 5–15)
BUN: 16 mg/dL (ref 6–20)
CO2: 24 mmol/L (ref 22–32)
Calcium: 8.6 mg/dL — ABNORMAL LOW (ref 8.9–10.3)
Chloride: 98 mmol/L (ref 98–111)
Creatinine, Ser: 1.18 mg/dL — ABNORMAL HIGH (ref 0.44–1.00)
GFR, Estimated: 58 mL/min — ABNORMAL LOW (ref >60)
Glucose, Bld: 141 mg/dL — ABNORMAL HIGH (ref 70–99)
Potassium: 4.8 mmol/L (ref 3.5–5.1)
Sodium: 136 mmol/L (ref 135–145)

## 2022-09-26 LAB — HEPARIN LEVEL (UNFRACTIONATED)
Heparin Unfractionated: 0.3 IU/mL (ref 0.30–0.70)
Heparin Unfractionated: 0.43 IU/mL (ref 0.30–0.70)
Heparin Unfractionated: 0.4 IU/mL (ref 0.30–0.70)

## 2022-09-26 LAB — HEMOGLOBIN A1C
Hgb A1c MFr Bld: 10.8 % — ABNORMAL HIGH (ref 4.8–5.6)
Mean Plasma Glucose: 263 mg/dL

## 2022-09-26 LAB — MAGNESIUM
Magnesium: 2.5 mg/dL — ABNORMAL HIGH (ref 1.7–2.4)
Magnesium: 2.4 mg/dL (ref 1.7–2.4)

## 2022-09-26 LAB — PHOSPHORUS: Phosphorus: 5.2 mg/dL — ABNORMAL HIGH (ref 2.5–4.6)

## 2022-09-26 MED ORDER — INSULIN ASPART 100 UNIT/ML IJ SOLN
0.0000 [IU] | INTRAMUSCULAR | Status: DC
  Administered 2022-09-26 – 2022-09-27 (×4): 2 [IU] via SUBCUTANEOUS
  Administered 2022-09-27 (×3): 3 [IU] via SUBCUTANEOUS
  Administered 2022-09-27: 2 [IU] via SUBCUTANEOUS
  Administered 2022-09-28: 5 [IU] via SUBCUTANEOUS
  Administered 2022-09-28 – 2022-09-29 (×9): 3 [IU] via SUBCUTANEOUS
  Administered 2022-09-29: 5 [IU] via SUBCUTANEOUS
  Administered 2022-09-29: 3 [IU] via SUBCUTANEOUS
  Administered 2022-09-30: 2 [IU] via SUBCUTANEOUS

## 2022-09-26 MED ORDER — METOPROLOL TARTRATE 25 MG PO TABS
12.5000 mg | ORAL_TABLET | Freq: Two times a day (BID) | ORAL | Status: DC
  Administered 2022-09-26 – 2022-09-27 (×4): 12.5 mg
  Filled 2022-09-26 (×4): qty 1

## 2022-09-26 MED ORDER — ADULT MULTIVITAMIN LIQUID CH
15.0000 mL | Freq: Every day | ORAL | Status: DC
  Administered 2022-09-26 – 2022-09-27 (×2): 15 mL
  Filled 2022-09-26 (×3): qty 15

## 2022-09-26 MED ORDER — VITAL HIGH PROTEIN PO LIQD
1000.0000 mL | ORAL | Status: DC
  Administered 2022-09-26: 1000 mL

## 2022-09-26 MED ORDER — SPIRONOLACTONE 25 MG PO TABS
25.0000 mg | ORAL_TABLET | Freq: Every day | ORAL | Status: DC
  Administered 2022-09-26 – 2022-09-29 (×4): 25 mg
  Filled 2022-09-26 (×4): qty 1

## 2022-09-26 MED ORDER — METOPROLOL SUCCINATE ER 25 MG PO TB24: 25.0000 mg | ORAL_TABLET | Freq: Every day | ORAL | Status: DC

## 2022-09-26 MED ORDER — VITAL HIGH PROTEIN PO LIQD: 1000.0000 mL | ORAL | Status: DC

## 2022-09-26 MED ORDER — SENNA 8.6 MG PO TABS
1.0000 | ORAL_TABLET | Freq: Every day | ORAL | Status: DC
  Administered 2022-09-26: 8.6 mg
  Filled 2022-09-26: qty 1

## 2022-09-26 MED ORDER — VITAL HIGH PROTEIN PO LIQD
1000.0000 mL | ORAL | Status: DC
  Administered 2022-09-26 – 2022-09-28 (×3): 1000 mL

## 2022-09-26 MED ORDER — PROSOURCE TF20 ENFIT COMPATIBL EN LIQD
60.0000 mL | Freq: Every day | ENTERAL | Status: DC
  Administered 2022-09-26 – 2022-09-28 (×3): 60 mL
  Filled 2022-09-26 (×3): qty 60

## 2022-09-26 MED ORDER — POLYETHYLENE GLYCOL 3350 17 G PO PACK: 17.0000 g | PACK | Freq: Two times a day (BID) | ORAL | Status: DC

## 2022-09-26 NOTE — Progress Notes (Addendum)
Dr. Lorrin Goodell called regarding SBP 108-110 via A-line. Asked if Neo needed to be restarted. MD is ok with SBP >100. Neo remains off.

## 2022-09-26 NOTE — Progress Notes (Signed)
PT Cancellation Note  Patient Details Name: [name] MRN: [id number] DOB: [birth year]   Cancelled Treatment:    Reason Eval/Treat Not Completed: Other (comment) Pt awaiting possible extubation today, PT will continue to check on throughout the day and re-evaluate when appropriate.   West Carbo, PT, DPT   Acute Rehabilitation Department Office [place] Communication Preferred   Sandra Cockayne 09/26/2022, 8:21 AM

## 2022-09-26 NOTE — Progress Notes (Signed)
ANTICOAGULATION CONSULT NOTE -   Pharmacy Consult for Heparin Indication:  LV Thrombus  Allergies  Allergen Reactions   Fish Allergy Swelling   Shellfish Allergy Anaphylaxis, Swelling and Other (See Comments)    Airway involvement including EMS - Has Epi-Pen   Metformin And Related Diarrhea   Trulicity [Dulaglutide] Nausea And Vomiting and Other (See Comments)    Multiple episodes of vomiting over multiple days   Nsaids Other (See Comments)    Per PCP interferes with daily meds (heart failure)   Advil [Ibuprofen] Other (See Comments)    Per PCP interferes with daily meds (heart failure)    Patient Measurements: Height: 4' 11.02" (149.9 cm) Weight: 91.7 kg (202 lb 2.6 oz) IBW/kg (Calculated) : 43.24 Heparin Dosing Weight: 65.6 kg  Vital Signs: Temp: 98.1 F (36.7 C) (03/04 1200) Temp Source: Axillary (03/04 1200) BP: 113/87 (03/04 1500) Pulse Rate: 78 (03/04 1500)  Labs: Recent Labs    09/24/22 0130 09/24/22 2231 09/25/22 0508 09/25/22 2258 09/26/22 0413 09/26/22 0638 09/26/22 1456  HGB 12.3  --  12.5  --  12.1  --   --   HCT 39.4  --  38.5  --  37.1  --   --   PLT 213  --  234  --  233  --   --   HEPARINUNFRC 0.47  --   --  0.13*  --  0.30 0.43  CREATININE 1.04* 1.08* 1.14*  --  1.18*  --   --      Estimated Creatinine Clearance: 60.1 mL/min (A) (by C-G formula based on SCr of 1.18 mg/dL (H)).   Medical History: Past Medical History:  Diagnosis Date   Abnormal Pap smear    Acute combined systolic and diastolic heart failure ([place]) 02/19/2019   Wt Readings from Last 3 Encounters:  09/18/21  84.7 kg  09/02/21  84.4 kg  08/31/21  84.2 kg         Acute heart failure ([place]) 01/29/2022   Acute on chronic combined systolic and diastolic CHF (congestive heart failure) ([place]) 02/19/2019   Acute on chronic congestive heart failure ([facility])    Acute on chronic congestive heart failure ([facility])    Acute on chronic diastolic CHF (congestive heart failure) ([place]) 10/19/2021    Acute on chronic systolic heart failure ([place]) 02/22/2022   Anemia    Asthma    Atrial fibrillation ([place])    history of paroxysmal atrial fibrillation   CHF (congestive heart failure) ([place]) 11/02/2021   CHF exacerbation ([place]) 10/17/2021   Diabetes mellitus    DKA, type 2 ([place]) 02/06/2019   Elevated troponin 09/30/2019   Heart failure with reduced ejection fraction ([place])    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     Migraines    Morbid obesity ([place]) 02/06/2019   Nausea & vomiting 06/26/2022   Nonischemic cardiomyopathy ([place]) 09/22/2021   NSTEMI (non-ST elevated myocardial infarction) ([place]) 02/23/2022   Obstructive sleep apnea 06/30/2015   Polycystic ovarian disease     Assessment:  47 y.o. 2 week history of lower extremity edema, shortness of breath, and chest pain. Patient developed stroke  as inpatient. Now found to have an LV Thrombus.  Pharmacy consulted to start heparin infusion for LV thrombus using the stroke protocol (no bolus).  Patient on warfarin PTA.  HL 0.43 on 950 units/h - therapeutic, but trending up - will recheck in 6 hours to make sure it does note exceed the desired range. CBC stable   Goal of Therapy:  Heparin level 0.3-0.5 units/ml Monitor platelets by anticoagulation protocol: Yes   Plan:  Continue heparin infusion at 950 units/hr Check anti-Xa level in 6 hours and daily while on heparin Continue to monitor H&H and platelets  Alanda Slim, PharmD, [facility] Clinical Pharmacist Please see AMION for all Pharmacists' Contact Phone Numbers 09/26/2022, 3:42 PM

## 2022-09-26 NOTE — Consult Note (Signed)
NAME:  [name], MRN:  [id number], DOB:  [birth year], LOS: 3 ADMISSION DATE:  09/22/2022, CONSULTATION DATE:  09/24/2022 REFERRING MD:  Dr. Allegra Grana, CHIEF COMPLAINT:  Vent management   History of Present Illness:  [name] is a 47 y.o. with an extensive PMH that includes but not limited to Acute combined systolic and diastolic congestive heart failure, history of LV thrombus, prior NSTEMMI, prior PE, A-fib, type 2 diabetes, HTN, depression, and OSA who presented 3/1 with 2 week history of lower extremity edema, shortness of breath, and chest pain. Patient was admitted per FMTS for management of acute CHF exacerbation and possible NSTEMI  Afternoon of 3/2 patient suffered acute change in mental status resulting in code stroke being called.  This is in the setting of subtherapeutic INR of 1.3 despite anticoagulation with Coumadin on admission with history of chronic LV thrombus and paroxysmal atrial fibrillation.  CTA head revealed likely evolving stroke with proximal right M2 occlusion with minimal collateral flow throughout the MCA distribution.  Neurology and interventional neurology consulted and patient was taken urgently for thrombectomy.  Postoperatively patient remained on ventilator prompting PCCM consult.  Pertinent  Medical History  Includes but not limited to: Acute combined systolic and diastolic congestive heart failure, history of LV thrombus, prior NSTEMMI, prior PE, A-fib, type 2 diabetes, HTN, depression, and OSA  Significant Hospital Events: Including procedures, antibiotic start and stop dates in addition to other pertinent events   3/2 Sedated on vent   Interim History / Subjective:   No acute events overnight Heparin resumed yesterday More somnolent today  Objective   Blood pressure 107/79, pulse 74, temperature (!) 97.4 F (36.3 C), temperature source Axillary, resp. rate 18, height 4' 11.02" (1.499 m), weight 91.7 kg, last menstrual period 06/22/2022, SpO2  100 %.    Vent Mode: PSV;CPAP FiO2 (%):  [40 %] 40 % Set Rate:  [18 bmp] 18 bmp Vt Set:  [340 mL] 340 mL PEEP:  [5 cmH20] 5 cmH20 Pressure Support:  [5 cmH20-10 cmH20] 10 cmH20 Plateau Pressure:  [10 cmH20] 10 cmH20   Intake/Output Summary (Last 24 hours) at 09/26/2022 0803 Last data filed at 09/26/2022 0700 Gross per 24 hour  Intake 700.12 ml  Output 1750 ml  Net -1049.88 ml   Filed Weights   09/25/22 0500 09/25/22 1615 09/26/22 0419  Weight: 92.6 kg 92.6 kg 91.7 kg    Examination: General: Acute on chronically ill appearing adult female lying in bed on mechanical ventilation, in NAD, obese HEENT: ETT, MM pink/moist, PERRL 45m Neuro: PERRL sluggish, cough/gag reflex present. Able to follow some commands, less so than yesterday CV: s1s2 regular rate and rhythm, no murmur, rubs, or gallops,  PULM:  Clear to auscultation, no increased work of breathing, no added breath sounds GI: soft, bowel sounds active in all 4 quadrants, non-tender, non-distended Extremities: warm/dry, no edema  Skin: no rashes or lesions  Resolved Hospital Problem list     Assessment & Plan:  Postoperative ventilator management  -Remained intubated post thrombectomy  P: Continue ventilator support with lung protective strategies  Wean PEEP and FiO2 for sats greater than 90%. Head of bed elevated 30 degrees. Plateau pressures less than 30 cm H20.  Follow intermittent chest x-ray and ABG.   SAT/SBT as tolerated Ensure adequate pulmonary hygiene  Follow cultures  VAP bundle in place  PAD protocol  Acute CVA  -Acute neuro change seen in hospital with code stroke called.  CTA head revealed likely evolving stroke  with proximal right M2 occlusion with minimal collateral flow throughout the MCA distribution.  Neurology and interventional neurology consulted and patient was taken urgently for thrombectom P: Primary management per neurology  Maintain neuro protective measures; goal for eurothermia,  euglycemia, eunatermia, normoxia, and PCO2 goal of 35-40 Seizure precautions  Aspirations precautions  Repeat CT Head today given decreased mental status  In hospital cardiac arrest -On induction for thrombectomy patient lost blood pressure follow by lose of pulses, 81mns of ACLS protocol provided prior to ROSC P: Continuous telemetry Continue pressors for MAP goal >65  Acute on chronic combined systolic and diastolic congestive heart failure  Nonischemic cardiomyopathy  -Most recent ECHO August 2023 with ED < 20% and grade III diastolic dysfunction  Elevated Troponin  P: Primary management per cardiology  Continuous telemetry  Strict intake and output  Daily weight to assess volume status Hold lasix, Losartan, Toprol-XL, Spironolactone, and Jardance Closely monitor renal function and electrolytes  Vent support as above  Optimize electrolytes   Chronic LV thrombus  Subtherapeutic INR on Coumadin -ECHO August 2023 with 2.2 x 1.4LV thrombus at the apex with some mobility -INR on admit 1.3 P: Heparin gtt Follow up CT head incase hemorrhagic conversion and heparin needs to be stopped  Paroxysmal Atrial Fibrillation  P: Anticoagulation as above  Continuous telemetry  Type 2 diabetes A1C 10.8 on admit  P: SSI  CBG goal 140-180  Hypokalemia P: Trend Bmet Supplement as needed   Nutrition - plan to start tube feeds later today  Best Practice (right click and "Reselect all SmartList Selections" daily)   Diet/type: NPO DVT prophylaxis: SCD GI prophylaxis: PPI Lines: Central line Foley:  Yes, and it is still needed Code Status:  full code Last date of multidisciplinary goals of care discussion: Pending   Labs   CBC: Recent Labs  Lab 09/22/22 2050 09/23/22 0514 09/24/22 0130 09/25/22 0508 09/26/22 0413  WBC 8.7 7.5 6.7 7.9 10.3  NEUTROABS 6.0  --   --   --   --   HGB 13.0 12.4 12.3 12.5 12.1  HCT 41.3 39.6 39.4 38.5 37.1  MCV 71.1* 71.4* 71.0* 70.8* 70.1*   PLT 232 228 213 234 [id number]   Basic Metabolic Panel: Recent Labs  Lab 09/23/22 1218 09/24/22 0130 09/24/22 2231 09/25/22 0508 09/26/22 0413  NA 133* 133* 135 136 136  K 3.6 3.3* 4.1 4.6 4.8  CL 96* 96* 96* 96* 98  CO2 '22 28 27 26 24  '$ GLUCOSE 267* 98 151* 140* 141*  BUN '14 12 12 13 16  '$ CREATININE 0.98 1.04* 1.08* 1.14* 1.18*  CALCIUM 8.1* 8.4* 8.6* 8.9 8.6*  MG 1.3* 1.6* 1.9 2.4 2.5*  PHOS  --   --   --  5.4*  --    GFR: Estimated Creatinine Clearance: 60.1 mL/min (A) (by C-G formula based on SCr of 1.18 mg/dL (H)). Recent Labs  Lab 09/23/22 0514 09/24/22 0130 09/25/22 0508 09/26/22 0413  WBC 7.5 6.7 7.9 10.3    Liver Function Tests: No results for input(s): "AST", "ALT", "ALKPHOS", "BILITOT", "PROT", "ALBUMIN" in the last 168 hours. No results for input(s): "LIPASE", "AMYLASE" in the last 168 hours. No results for input(s): "AMMONIA" in the last 168 hours.  ABG    Component Value Date/Time   PHART 7.451 (H) 09/17/2021 2204   PCO2ART 31.6 (L) 09/17/2021 2204   PO2ART 114 (H) 09/17/2021 2204   HCO3 28.1 (H) 09/22/2022 2128   TCO2 33 (H) 09/20/2021 0840  TCO2 32 09/20/2021 0840   ACIDBASEDEF 0.8 10/18/2021 0750   O2SAT 80.3 09/22/2022 2128     Coagulation Profile: Recent Labs  Lab 09/22/22 2134  INR 1.3*    Cardiac Enzymes: No results for input(s): "CKTOTAL", "CKMB", "CKMBINDEX", "TROPONINI" in the last 168 hours.  HbA1C: HbA1c, POC (controlled diabetic range)  Date/Time Value Ref Range Status  08/31/2021 11:20 AM 13.2 (A) 0.0 - 7.0 % Final  12/11/2019 10:39 AM 9.3 (A) 0.0 - 7.0 % Final   Hgb A1c MFr Bld  Date/Time Value Ref Range Status  09/23/2022 12:18 PM 10.8 (H) 4.8 - 5.6 % Final    Comment:    (NOTE)         Prediabetes: 5.7 - 6.4         Diabetes: >6.4         Glycemic control for adults with diabetes: <7.0   06/02/2022 09:36 PM >15.5 (H) 4.8 - 5.6 % Final    Comment:    (NOTE)         Prediabetes: 5.7 - 6.4         Diabetes: >6.4          Glycemic control for adults with diabetes: <7.0     CBG: Recent Labs  Lab 09/25/22 1139 09/25/22 1727 09/25/22 2138 09/26/22 0627 09/26/22 0739  GLUCAP 132* 113* 133* 151* 130*       Critical care time:   CRITICAL CARE Performed by: Freddi Starr   Total critical care time: 35 minutes  Critical care time was exclusive of separately billable procedures and treating other patients.  Critical care was necessary to treat or prevent imminent or life-threatening deterioration.  Critical care was time spent personally by me on the following activities: development of treatment plan with patient and/or surrogate as well as nursing, discussions with consultants, evaluation of patient's response to treatment, examination of patient, obtaining history from patient or surrogate, ordering and performing treatments and interventions, ordering and review of laboratory studies, ordering and review of radiographic studies, pulse oximetry and re-evaluation of patient's condition.  Freda Jackson, MD [facility] Office: [phone]   See Amion for personal pager PCCM on call pager [phone] until 7pm. Please call Elink 7p-7a. [phone]

## 2022-09-26 NOTE — Progress Notes (Signed)
STROKE TEAM PROGRESS NOTE   INTERVAL HISTORY Her daughter and RN are at the bedside. Pt was more somnolence and drowsy this morning but more awake alert as time goes in the morning. On my exam, she is more awake than earlier, eyes open on voice, able to follow some simple commands. Will repeat CT head to rule out hemorrhagic conversion.    Vitals:   09/26/22 0700 09/26/22 0800 09/26/22 0900 09/26/22 1000  BP: 107/79 (!) 134/96 (!) 121/90 (!) 133/101  Pulse: 74 81 79 85  Resp: '18 18 15 '$ (!) 27  Temp:  98.1 F (36.7 C)    TempSrc:  Oral    SpO2: 100% 100% 100% 100%  Weight:      Height:       CBC:  Recent Labs  Lab 09/22/22 2050 09/23/22 0514 09/25/22 0508 09/26/22 0413  WBC 8.7   < > 7.9 10.3  NEUTROABS 6.0  --   --   --   HGB 13.0   < > 12.5 12.1  HCT 41.3   < > 38.5 37.1  MCV 71.1*   < > 70.8* 70.1*  PLT 232   < > 234 233   < > = values in this interval not displayed.   Basic Metabolic Panel:  Recent Labs  Lab 09/25/22 0508 09/26/22 0413  NA 136 136  K 4.6 4.8  CL 96* 98  CO2 26 24  GLUCOSE 140* 141*  BUN 13 16  CREATININE 1.14* 1.18*  CALCIUM 8.9 8.6*  MG 2.4 2.5*  PHOS 5.4*  --    Lipid Panel:  Recent Labs  Lab 09/24/22 0130 09/25/22 0508  CHOL 136  --   TRIG 62 116  HDL 27*  --   CHOLHDL 5.0  --   VLDL 12  --   LDLCALC 97  --    HgbA1c:  Recent Labs  Lab 09/24/22 0130  HGBA1C 10.8*   Urine Drug Screen: No results for input(s): "LABOPIA", "COCAINSCRNUR", "LABBENZ", "AMPHETMU", "THCU", "LABBARB" in the last 168 hours.  Alcohol Level No results for input(s): "ETH" in the last 168 hours.  IMAGING past 24 hours MR BRAIN WO CONTRAST  Result Date: 09/25/2022 CLINICAL DATA:  Stroke follow-up EXAM: MRI HEAD WITHOUT CONTRAST MRA HEAD WITHOUT CONTRAST TECHNIQUE: Multiplanar, multi-echo pulse sequences of the brain and surrounding structures were acquired without intravenous contrast. Angiographic images of the Circle of Willis were acquired using MRA  technique without intravenous contrast. COMPARISON:  CT/CTA head and neck 1 day prior FINDINGS: MRI HEAD FINDINGS Brain: There is large area of diffusion restriction with associated FLAIR signal abnormality in the right MCA distribution involving the external capsule, insular cortex, temporal lobe, frontal lobe, and parietal consistent with acute infarct. There is also a small area of infarct in the right superior frontal gyrus cortex near the vertex in the ACA distribution. Additional punctate infarcts are seen in the left cerebellar hemisphere, right caudate body/corona radiata, and right frontal lobe white matter. There is no evidence of hemorrhagic transformation. There is swelling with sulcal effacement but no midline shift. There are punctate chronic microhemorrhages in the right caudate body and right external capsule, nonspecific. The focus in the caudate body is unchanged since 2023. Background parenchymal volume is normal. The ventricles are normal in size. There is no significant underlying chronic small-vessel ischemic change, there is a small remote infarct but in the right cerebral peduncle, unchanged since 2023. The pituitary and suprasellar region are normal. There is no solid  mass lesion. Vascular: See below. Skull and upper cervical spine: Normal marrow signal. Sinuses/Orbits: The paranasal sinuses are clear. The globes and orbits are unremarkable. Other: None. MRA HEAD FINDINGS Anterior circulation: The intracranial ICAs are patent. The right M1 segment is normal. There is persistent occlusion of a dominant right M2 branch in the sylvian fissure (9-111). Patent MCA branches are seen in the frontal operculum region; however, there is otherwise minimal collateral flow in the remainder of the MCA distribution. The left M1 segment and distal branches are patent, without proximal stenosis or occlusion. The bilateral ACAs are patent, without proximal stenosis or occlusion. There is no aneurysm or AVM.  Posterior circulation: The bilateral V4 segments are patent. The basilar artery is patent. The major cerebellar arteries appear patent. The bilateral PCAs are patent, primarily supplied by prominent posterior communicating arteries bilaterally (fetal origins). There is no proximal stenosis or occlusion There is no aneurysm or AVM. Anatomic variants: As above. IMPRESSION: 1. Large area of acute infarct in the right MCA distribution, smaller area of acute infarct in the right ACA distribution, and punctate acute infarct in the left cerebellar hemisphere. No evidence of hemorrhagic transformation or midline shift. 2. Persistent occlusion of a right M2 branch with minimal collateral flow in the MCA distribution aside from in the region of the frontal operculum. Electronically Signed   By: Valetta Mole M.D.   On: 09/25/2022 15:10   MR ANGIO HEAD WO CONTRAST  Result Date: 09/25/2022 CLINICAL DATA:  Stroke follow-up EXAM: MRI HEAD WITHOUT CONTRAST MRA HEAD WITHOUT CONTRAST TECHNIQUE: Multiplanar, multi-echo pulse sequences of the brain and surrounding structures were acquired without intravenous contrast. Angiographic images of the Circle of Willis were acquired using MRA technique without intravenous contrast. COMPARISON:  CT/CTA head and neck 1 day prior FINDINGS: MRI HEAD FINDINGS Brain: There is large area of diffusion restriction with associated FLAIR signal abnormality in the right MCA distribution involving the external capsule, insular cortex, temporal lobe, frontal lobe, and parietal consistent with acute infarct. There is also a small area of infarct in the right superior frontal gyrus cortex near the vertex in the ACA distribution. Additional punctate infarcts are seen in the left cerebellar hemisphere, right caudate body/corona radiata, and right frontal lobe white matter. There is no evidence of hemorrhagic transformation. There is swelling with sulcal effacement but no midline shift. There are punctate  chronic microhemorrhages in the right caudate body and right external capsule, nonspecific. The focus in the caudate body is unchanged since 2023. Background parenchymal volume is normal. The ventricles are normal in size. There is no significant underlying chronic small-vessel ischemic change, there is a small remote infarct but in the right cerebral peduncle, unchanged since 2023. The pituitary and suprasellar region are normal. There is no solid mass lesion. Vascular: See below. Skull and upper cervical spine: Normal marrow signal. Sinuses/Orbits: The paranasal sinuses are clear. The globes and orbits are unremarkable. Other: None. MRA HEAD FINDINGS Anterior circulation: The intracranial ICAs are patent. The right M1 segment is normal. There is persistent occlusion of a dominant right M2 branch in the sylvian fissure (9-111). Patent MCA branches are seen in the frontal operculum region; however, there is otherwise minimal collateral flow in the remainder of the MCA distribution. The left M1 segment and distal branches are patent, without proximal stenosis or occlusion. The bilateral ACAs are patent, without proximal stenosis or occlusion. There is no aneurysm or AVM. Posterior circulation: The bilateral V4 segments are patent. The basilar artery is  patent. The major cerebellar arteries appear patent. The bilateral PCAs are patent, primarily supplied by prominent posterior communicating arteries bilaterally (fetal origins). There is no proximal stenosis or occlusion There is no aneurysm or AVM. Anatomic variants: As above. IMPRESSION: 1. Large area of acute infarct in the right MCA distribution, smaller area of acute infarct in the right ACA distribution, and punctate acute infarct in the left cerebellar hemisphere. No evidence of hemorrhagic transformation or midline shift. 2. Persistent occlusion of a right M2 branch with minimal collateral flow in the MCA distribution aside from in the region of the frontal  operculum. Electronically Signed   By: Valetta Mole M.D.   On: 09/25/2022 15:10    PHYSICAL EXAM  Temp:  [97.4 F (36.3 C)-98.4 F (36.9 C)] 98.1 F (36.7 C) (03/04 0800) Pulse Rate:  [72-99] 85 (03/04 1000) Resp:  [9-29] 27 (03/04 1000) BP: (104-139)/(76-110) 133/101 (03/04 1000) SpO2:  [74 %-100 %] 100 % (03/04 1000) Arterial Line BP: (104-153)/(68-106) 153/96 (03/04 1000) FiO2 (%):  [40 %] 40 % (03/04 0738) Weight:  [91.7 kg-92.6 kg] 91.7 kg (03/04 0419)   General - Well nourished, well developed, intubated off sedation.   Ophthalmologic - fundi not visualized due to noncooperation.   Cardiovascular - Regular rate and rhythm, not in afib.   Neuro - intubated off sedation, eyes open on voice, following most peripheral commands on the right and limited midline commands today. With eye opening, eyes in mid position, blinking to visual threat on the right but not on the left. Not actively tracking, PERRL. Corneal reflex present, gag and cough present. Breathing over the vent.  Facial symmetry not able to test due to ET tube. Tongue protrusion not cooperative. Spontaneous movement of RUE against gravity, LUE flaccid, BLEs slight withdraw to pain. Sensation, coordination and gait not tested.     ASSESSMENT/PLAN Ms. RHEGAN CANGEMI is a 47 y.o. female with extensive PMH that includes but not limited to Acute combined systolic and diastolic congestive heart failure, history of LV thrombus, prior NSTEMMI, prior PE, A-fib, type 2 diabetes, HTN, depression, and OSA who presented 3/1 with 2 week history of lower extremity edema, shortness of breath, and chest pain.  Neurology was consulted for code stroke 3/2 due to left-sided weakness.  Patient underwent IR procedure due to right M2 occlusion she remained intubated postprocedure.  Stroke workup as detailed below.  3/3 MRI negative for hemorrhage, will restart heparin drip.  Will plan for SBT and hopeful extubation early tomorrow a.m.  Appreciate  CCM assistance with this.  Stroke - right MCA infarct with right M2 occlusion, s/p IR with TICI2b, etiology likely embolic from known LV thrombus, cardiomyopathy with low EF and PAF even on heparin IV Code Stroke CT head: Loss of gray-white differentiation in the insula likely reflecting early infarct.  Aspects is 9 CTA head & neck:  Proximal right M2 occlusion with minimal collateral flow throughout the MCA distribution. IR - Occluded inferior division of the right middle cerebral artery in the mid M2 segment. TICI 2B revascularization.  Post IR CT: no evidence of intracranial hemorrhage.  MRI: Large area of acute infarct in the right MCA distribution, smaller area of acute infarct in the right ACA distribution, and punctate acute infarct in the left cerebellar hemisphere. No evidence of hemorrhagic transformation or midline shift. MRA head - Persistent occlusion of a right M2 branch with minimal collateral flow in the MCA distribution aside from in the region of the frontal operculum. CT  repeat 3/4 pending 2D Echo: Large LV apical thrombus present, LVEF 15% LDL 97 HgbA1c 10.8 VTE prophylaxis - SCDs, Heparin IV  warfarin daily prior to admission (?compliance), now resumed heparin IV.  Therapy recommendations:  pending Disposition:  pending  Brief Cardiac Arrest during IR procedure 13mns of ACLS before ROSC achieved Cardiology/CCM on board  Acute Respiratory Failure Remains intubated Now on weaning Appreciate CCM assistance Extubation as able  Acute on chronic combined congestive heart failure Nonischemic cardiomyopathy 2D Echo: Large LV apical thrombus present, LVEF 15% Subtherapeutic INR on admission Resumed Heparin gtt Cardiology on board R/L cath 08/2021 showed mild, nonobstructive CAD CXR with cardiomegaly and mild pulmonary vascular congestion. Strict Intake/Output, Foley catheter, daily weights Resume Toprol and spironolactone Hold Losartan for now, once BP further stable,  will add back  Paroxysmal Atrial Fibrillation Resumed Heparin gtt  Continuous telemetry monitoring  Hx of hypertension hypotension Home meds:  none stable Off Neo Resumed Toprol and spironolactone, hold off cozaar  BP goal 120-180 Long-term BP goal normotensive  Hyperlipidemia Home meds:  none LDL 97, goal < 70 Add lipitor '80mg'$   Continue statin at discharge  Diabetes type II Uncontrolled Home meds:  novolog flexpen, and Jardiance HgbA1c 10.8, goal < 7.0 CBG  SSI Close PCP follow up for better DM control  Dysphagia  NPO now Consider TF today after CT Consider cortrak if planning for extubation  Other Stroke Risk Factors Obesity, Body mass index is 40.81 kg/m., BMI >/= 30 associated with increased stroke risk, recommend weight loss, diet and exercise as appropriate  Coronary artery disease / NSTEMI Migraines Obstructive sleep apnea  Other Active Problems Hypokalemia, resolved. Goal >4 Hypomagnesia, resolved. [place]>2.  AKI Cre 1.04->1.14->1.18  Hospital day # 3  This patient is critically ill due to right MCA stroke, status post mechanical thrombectomy, LV thrombus, A-fib, cardiomyopathy, respiratory failure and at significant risk of neurological worsening, death form recurrent stroke, hemorrhagic transformation, heart failure, arrhythmia, seizure. This patient's care requires constant monitoring of vital signs, hemodynamics, respiratory and cardiac monitoring, review of multiple databases, neurological assessment, discussion with family, other specialists and medical decision making of high complexity. I spent 40 minutes of neurocritical care time in the care of this patient. I had long discussion with daughter at bedside, updated pt current condition, treatment plan and potential prognosis, and answered all the questions.  They expressed understanding and appreciation.  I also discussed with Dr. DErin FullingCCM and Dr. DEstanislado PandyIR.   JRosalin Hawking MD PhD Stroke  Neurology 09/26/2022 10:45 AM     To contact Stroke Continuity provider, please refer to [email or web address] After hours, contact General Neurology

## 2022-09-26 NOTE — Progress Notes (Signed)
Initial Nutrition Assessment  DOCUMENTATION CODES:   Morbid obesity  INTERVENTION:  - Initiate Vital HP @ 15 mL/hr and advance by 10 mL Q8H to a goal rate of 55 mL/hr.  This will provide 1320 kcals, 115 gm protein and 1103 mL free water.   - Add MVI via tube.   NUTRITION DIAGNOSIS:   Inadequate oral intake related to inability to eat, nausea, poor appetite as evidenced by NPO status, energy intake < 75% for > or equal to 3 months.  GOAL:   Patient will meet greater than or equal to 90% of their needs  MONITOR:   TF tolerance  REASON FOR ASSESSMENT:   Consult, Ventilator Enteral/tube feeding initiation and management  ASSESSMENT:   47 y.o. female admits related to SOB. PMH includes: CHF, afib, DM, NSTEMI. Pt is currently receiving medical management related to acute on chronic combined CHF.  Meds reviewed: lipitor, colace, sliding scale insulin, reglan, miralax, senokot. Labs reviewed: Mag low. I/Os reviewed.   The pt is currently intubated. Cortrak tube placed today. Daughters were present at pt's bedside. They report that their mother has not been eating well since September due to constant nausea. They report that she would consistently eat 1-2 meals per day. No significant wt loss per record. RD will initiate feeds with slower advancement to monitor for refeeding.   NUTRITION - FOCUSED PHYSICAL EXAM:  Flowsheet Row Most Recent Value  Orbital Region No depletion  Upper Arm Region No depletion  Thoracic and Lumbar Region No depletion  Buccal Region No depletion  Temple Region No depletion  Clavicle Bone Region No depletion  Clavicle and Acromion Bone Region No depletion  Scapular Bone Region No depletion  Dorsal Hand No depletion  Patellar Region No depletion  Anterior Thigh Region No depletion  Posterior Calf Region No depletion  Edema (RD Assessment) Moderate  Hair Reviewed  Eyes Unable to assess  Mouth Unable to assess  Skin Reviewed  Nails Reviewed         Diet Order:   Diet Order             Diet NPO time specified  Diet effective now                   EDUCATION NEEDS:   Not appropriate for education at this time  Skin:  Skin Assessment: Skin Integrity Issues: Skin Integrity Issues:: Incisions Incisions: right groin  Last BM:  2/28  Height:   Ht Readings from Last 1 Encounters:  09/25/22 4' 11.02" (1.499 m)    Weight:   Wt Readings from Last 1 Encounters:  09/26/22 91.7 kg    Ideal Body Weight:     BMI:  Body mass index is 40.81 kg/m.  Estimated Nutritional Needs:   Kcal:  1100-1350 kcals  Protein:  85-115 gm  Fluid:  >/= 1.1 L  Thalia Bloodgood, RD, LDN, CNSC.

## 2022-09-26 NOTE — Progress Notes (Signed)
ANTICOAGULATION CONSULT NOTE -   Pharmacy Consult for Heparin Indication:  LV Thrombus  Allergies  Allergen Reactions   Fish Allergy Swelling   Shellfish Allergy Anaphylaxis, Swelling and Other (See Comments)    Airway involvement including EMS - Has Epi-Pen   Metformin And Related Diarrhea   Trulicity [Dulaglutide] Nausea And Vomiting and Other (See Comments)    Multiple episodes of vomiting over multiple days   Nsaids Other (See Comments)    Per PCP interferes with daily meds (heart failure)   Advil [Ibuprofen] Other (See Comments)    Per PCP interferes with daily meds (heart failure)    Patient Measurements: Height: 4' 11.02" (149.9 cm) Weight: 91.7 kg (202 lb 2.6 oz) IBW/kg (Calculated) : 43.24 Heparin Dosing Weight: 65.6 kg  Vital Signs: Temp: 98.4 F (36.9 C) (03/04 2000) Temp Source: Axillary (03/04 2000) BP: 118/96 (03/04 2200) Pulse Rate: 83 (03/04 2200)  Labs: Recent Labs    09/24/22 0130 09/24/22 2231 09/25/22 0508 09/25/22 2258 09/26/22 0413 09/26/22 0638 09/26/22 1456 09/26/22 2155  HGB 12.3  --  12.5  --  12.1  --   --   --   HCT 39.4  --  38.5  --  37.1  --   --   --   PLT 213  --  234  --  233  --   --   --   HEPARINUNFRC 0.47  --   --    < >  --  0.30 0.43 0.40  CREATININE 1.04* 1.08* 1.14*  --  1.18*  --   --   --    < > = values in this interval not displayed.     Estimated Creatinine Clearance: 60.1 mL/min (A) (by C-G formula based on SCr of 1.18 mg/dL (H)).   Medical History: Past Medical History:  Diagnosis Date   Abnormal Pap smear    Acute combined systolic and diastolic heart failure ([place]) 02/19/2019   Wt Readings from Last 3 Encounters:  09/18/21  84.7 kg  09/02/21  84.4 kg  08/31/21  84.2 kg         Acute heart failure ([place]) 01/29/2022   Acute on chronic combined systolic and diastolic CHF (congestive heart failure) ([place]) 02/19/2019   Acute on chronic congestive heart failure ([facility])    Acute on chronic congestive heart failure  ([facility])    Acute on chronic diastolic CHF (congestive heart failure) ([place]) 10/19/2021   Acute on chronic systolic heart failure ([place]) 02/22/2022   Anemia    Asthma    Atrial fibrillation ([place])    history of paroxysmal atrial fibrillation   CHF (congestive heart failure) ([place]) 11/02/2021   CHF exacerbation ([place]) 10/17/2021   Diabetes mellitus    DKA, type 2 ([place]) 02/06/2019   Elevated troponin 09/30/2019   Heart failure with reduced ejection fraction ([place])    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     Migraines    Morbid obesity ([place]) 02/06/2019   Nausea & vomiting 06/26/2022   Nonischemic cardiomyopathy ([place]) 09/22/2021   NSTEMI (non-ST elevated myocardial infarction) ([place]) 02/23/2022   Obstructive sleep apnea 06/30/2015  Polycystic ovarian disease     Assessment:  47 y.o. 2 week history of lower extremity edema, shortness of breath, and chest pain. Patient developed stroke as inpatient. Now found to have an LV Thrombus.  Pharmacy consulted to start heparin infusion for LV thrombus using the stroke protocol (no bolus).  Patient on warfarin PTA.  HL 0.4 on 950 units/h - therapeutic. CBC stable   Goal of Therapy:  Heparin level 0.3-0.5 units/ml Monitor platelets by anticoagulation protocol: Yes   Plan:  Continue heparin infusion at 950 units/hr Check anti-Xa level daily while on heparin Continue to monitor H&H and platelets  Alanda Slim, PharmD, [facility] Clinical Pharmacist Please see AMION for all Pharmacists' Contact Phone Numbers 09/26/2022, 10:24 PM

## 2022-09-26 NOTE — Progress Notes (Signed)
OT Cancellation Note  Patient Details Name: [name] MRN: [id number] DOB: [birth year]   Cancelled Treatment:    Reason Eval/Treat Not Completed: Other (comment) (possible extubation today. will assess later time)  [facility] 09/26/2022, 8:43 AM Maurie Boettcher, OT/L   Acute OT Clinical Specialist Acute Rehabilitation Services Pager [phone] Office [phone]

## 2022-09-26 NOTE — Progress Notes (Signed)
Pt transported on vent from 4N20 to CT and back without any complications.RN at bedside, RT will monitor.

## 2022-09-26 NOTE — Progress Notes (Signed)
ANTICOAGULATION CONSULT NOTE -   Pharmacy Consult for Heparin Indication:  LV Thrombus  Allergies  Allergen Reactions   Fish Allergy Swelling   Shellfish Allergy Anaphylaxis, Swelling and Other (See Comments)    Airway involvement including EMS - Has Epi-Pen   Metformin And Related Diarrhea   Trulicity [Dulaglutide] Nausea And Vomiting and Other (See Comments)    Multiple episodes of vomiting over multiple days   Nsaids Other (See Comments)    Per PCP interferes with daily meds (heart failure)   Advil [Ibuprofen] Other (See Comments)    Per PCP interferes with daily meds (heart failure)    Patient Measurements: Height: 4' 11.02" (149.9 cm) Weight: 91.7 kg (202 lb 2.6 oz) IBW/kg (Calculated) : 43.24 Heparin Dosing Weight: 65.6 kg  Vital Signs: Temp: 97.4 F (36.3 C) (03/04 0400) Temp Source: Axillary (03/04 0400) BP: 107/79 (03/04 0700) Pulse Rate: 74 (03/04 0700)  Labs: Recent Labs    09/24/22 0130 09/24/22 2231 09/25/22 0508 09/25/22 2258 09/26/22 0413 09/26/22 0638  HGB 12.3  --  12.5  --  12.1  --   HCT 39.4  --  38.5  --  37.1  --   PLT 213  --  234  --  233  --   HEPARINUNFRC 0.47  --   --  0.13*  --  0.30  CREATININE 1.04* 1.08* 1.14*  --  1.18*  --      Estimated Creatinine Clearance: 60.1 mL/min (A) (by C-G formula based on SCr of 1.18 mg/dL (H)).   Medical History: Past Medical History:  Diagnosis Date   Abnormal Pap smear    Acute combined systolic and diastolic heart failure ([place]) 02/19/2019   Wt Readings from Last 3 Encounters:  09/18/21  84.7 kg  09/02/21  84.4 kg  08/31/21  84.2 kg         Acute heart failure ([place]) 01/29/2022   Acute on chronic combined systolic and diastolic CHF (congestive heart failure) ([place]) 02/19/2019   Acute on chronic congestive heart failure ([facility])    Acute on chronic congestive heart failure ([facility])    Acute on chronic diastolic CHF (congestive heart failure) ([place]) 10/19/2021   Acute on chronic systolic heart failure  ([place]) 02/22/2022   Anemia    Asthma    Atrial fibrillation ([place])    history of paroxysmal atrial fibrillation   CHF (congestive heart failure) ([place]) 11/02/2021   CHF exacerbation ([place]) 10/17/2021   Diabetes mellitus    DKA, type 2 ([place]) 02/06/2019   Elevated troponin 09/30/2019   Heart failure with reduced ejection fraction ([place])    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     Migraines    Morbid obesity ([place]) 02/06/2019   Nausea & vomiting 06/26/2022   Nonischemic cardiomyopathy ([place]) 09/22/2021   NSTEMI (non-ST elevated myocardial infarction) ([place]) 02/23/2022   Obstructive sleep apnea 06/30/2015   Polycystic ovarian disease     Assessment:  47 y.o. 2 week history of lower extremity edema, shortness of breath, and chest pain. Patient developed stroke as inpatient. Now found to have an LV Thrombus.  Pharmacy consulted to start heparin  infusion for LV thrombus using the stroke protocol (no bolus).  Patient on warfarin PTA.  HL 0.3 on 900units/h - therapeutic CBC stable   Goal of Therapy:  Heparin level 0.3-0.5 units/ml Monitor platelets by anticoagulation protocol: Yes   Plan:  Continue heparin infusion at 950 units/hr Check anti-Xa level in 6 hours and daily while on heparin Continue to monitor H&H and platelets  Wilson Singer, PharmD Clinical Pharmacist 09/26/2022 7:48 AM

## 2022-09-26 NOTE — Progress Notes (Signed)
Rounding Note    Patient Name: [name] Date of Encounter: 09/26/2022  [place] Cardiologist: None [place]   Subjective   Acute change afternoon of 09/24/22, found to have left sided weakness and right gaze deviation. Did not receive TNK due to being on IV heparin. 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 INR was subtherapeutic due to medication non-adherence at home. Upon anesthesia induction patient lost BP and pulses, 2 mins of ACLS provided per PCCM documentation. PCCM and stroke team now following.  Seen in ICU bed 4N20, with daughter at bedside. Sedation off, eyes open, patient not participating in conversation when spoken to directly. Remains intubated. Family questions answered from cardiac perspective.  3/4 Weaning sedation. No movements when I was in the room. Daughter at the bedside  Inpatient Medications    Scheduled Meds:  atorvastatin  80 mg Per Tube QHS   Chlorhexidine Gluconate Cloth  6 each Topical Q0600   docusate  100 mg Per Tube BID   insulin aspart  0-15 Units Subcutaneous TID WC   metoCLOPramide  5 mg Per Tube TID AC & HS   mouth rinse  15 mL Mouth Rinse Q2H   pantoprazole (PROTONIX) IV  40 mg Intravenous QHS   polyethylene glycol  17 g Per Tube BID   senna  1 tablet Per Tube Daily   Continuous Infusions:  heparin 950 Units/hr (09/26/22 0700)   propofol (DIPRIVAN) infusion Stopped (09/26/22 0654)   PRN Meds: acetaminophen **OR** acetaminophen (TYLENOL) oral liquid 160 mg/5 mL **OR** acetaminophen, fentaNYL (SUBLIMAZE) injection, fentaNYL (SUBLIMAZE) injection, mouth rinse   Vital Signs    Vitals:   09/26/22 0700 09/26/22 0800 09/26/22 0900 09/26/22 1000  BP: 107/79 (!) 134/96 (!) 121/90 (!) 133/101  Pulse: 74 81 79 85  Resp: '18 18 15 '$ (!) 27  Temp:  98.1 F (36.7 C)    TempSrc:  Oral    SpO2: 100% 100% 100% 100%  Weight:      Height:        Intake/Output Summary (Last 24 hours) at 09/26/2022 1048 Last  data filed at 09/26/2022 0700 Gross per 24 hour  Intake 682.7 ml  Output 1625 ml  Net -942.3 ml      09/26/2022    4:19 AM 09/25/2022    4:15 PM 09/25/2022    5:00 AM  Last 3 Weights  Weight (lbs) 202 lb 2.6 oz 204 lb 2.3 oz 204 lb 2.3 oz  Weight (kg) 91.7 kg 92.6 kg 92.6 kg      Telemetry    Normal sinus rhythm- Personally Reviewed  ECG   NA- Personally Reviewed  Physical Exam   GEN: intubated. Cardiac: RRR, no murmurs, rubs, or gallops.  Respiratory: Diminished breath sounds GI: Soft, nontender, non-distended  MS: no pitting edema Neuro:  intubated  Psych: intubated  Labs    High Sensitivity Troponin:   Recent Labs  Lab 09/22/22 2050 09/22/22 2134  TROPONINIHS 197* 170*     Chemistry Recent Labs  Lab 09/24/22 2231 09/25/22 0508 09/26/22 0413  NA 135 136 136  K 4.1 4.6 4.8  CL 96* 96* 98  CO2 '27 26 24  '$ GLUCOSE 151* 140* 141*  BUN '12 13 16  '$ CREATININE 1.08* 1.14* 1.18*  CALCIUM 8.6* 8.9 8.6*  MG 1.9 2.4 2.5*  GFRNONAA >60 >60 58*  ANIONGAP '12 14 14    '$ Lipids  Recent Labs  Lab 09/24/22 0130 09/25/22 0508  CHOL 136  --  TRIG 62 116  HDL 27*  --   LDLCALC 97  --   CHOLHDL 5.0  --     Hematology Recent Labs  Lab 09/24/22 0130 09/25/22 0508 09/26/22 0413  WBC 6.7 7.9 10.3  RBC 5.55* 5.44* 5.29*  HGB 12.3 12.5 12.1  HCT 39.4 38.5 37.1  MCV 71.0* 70.8* 70.1*  MCH 22.2* 23.0* 22.9*  MCHC 31.2 32.5 32.6  RDW 23.9* 24.5* 24.5*  PLT 213 234 233   Thyroid  Recent Labs  Lab 09/24/22 0130  TSH 3.269    BNP Recent Labs  Lab 09/22/22 2050  BNP 688.8*    DDimer No results for input(s): "DDIMER" in the last 168 hours.   Radiology    MR BRAIN WO CONTRAST  Result Date: 09/25/2022 CLINICAL DATA:  Stroke follow-up EXAM: MRI HEAD WITHOUT CONTRAST MRA HEAD WITHOUT CONTRAST TECHNIQUE: Multiplanar, multi-echo pulse sequences of the brain and surrounding structures were acquired without intravenous contrast. Angiographic images of the [place] were acquired using MRA technique without intravenous contrast. COMPARISON:  CT/CTA head and neck 1 day prior FINDINGS: MRI HEAD FINDINGS Brain: There is large area of diffusion restriction with associated FLAIR signal abnormality in the right MCA distribution involving the external capsule, insular cortex, temporal lobe, frontal lobe, and parietal consistent with acute infarct. There is also a small area of infarct in the right superior frontal gyrus cortex near the vertex in the ACA distribution. Additional punctate infarcts are seen in the left cerebellar hemisphere, right caudate body/corona radiata, and right frontal lobe white matter. There is no evidence of hemorrhagic transformation. There is swelling with sulcal effacement but no midline shift. There are punctate chronic microhemorrhages in the right caudate body and right external capsule, nonspecific. The focus in the caudate body is unchanged since 2023. Background parenchymal volume is normal. The ventricles are normal in size. There is no significant underlying chronic small-vessel ischemic change, there is a small remote infarct but in the right cerebral peduncle, unchanged since 2023. The pituitary and suprasellar region are normal. There is no solid mass lesion. Vascular: See below. Skull and upper cervical spine: Normal marrow signal. Sinuses/Orbits: The paranasal sinuses are clear. The globes and orbits are unremarkable. Other: None. MRA HEAD FINDINGS Anterior circulation: The intracranial ICAs are patent. The right M1 segment is normal. There is persistent occlusion of a dominant right M2 Atticus Lemberger in the sylvian fissure (9-111). Patent MCA branches are seen in the frontal operculum region; however, there is otherwise minimal collateral flow in the remainder of the MCA distribution. The left M1 segment and distal branches are patent, without proximal stenosis or occlusion. The bilateral ACAs are patent, without proximal stenosis or occlusion.  There is no aneurysm or AVM. Posterior circulation: The bilateral V4 segments are patent. The basilar artery is patent. The major cerebellar arteries appear patent. The bilateral PCAs are patent, primarily supplied by prominent posterior communicating arteries bilaterally (fetal origins). There is no proximal stenosis or occlusion There is no aneurysm or AVM. Anatomic variants: As above. IMPRESSION: 1. Large area of acute infarct in the right MCA distribution, smaller area of acute infarct in the right ACA distribution, and punctate acute infarct in the left cerebellar hemisphere. No evidence of hemorrhagic transformation or midline shift. 2. Persistent occlusion of a right M2 Jordanny Waddington with minimal collateral flow in the MCA distribution aside from in the region of the frontal operculum. Electronically Signed   By: Valetta Mole M.D.   On: 09/25/2022 15:10  MR ANGIO HEAD WO CONTRAST  Result Date: 09/25/2022 CLINICAL DATA:  Stroke follow-up EXAM: MRI HEAD WITHOUT CONTRAST MRA HEAD WITHOUT CONTRAST TECHNIQUE: Multiplanar, multi-echo pulse sequences of the brain and surrounding structures were acquired without intravenous contrast. Angiographic images of the Circle of [place] were acquired using MRA technique without intravenous contrast. COMPARISON:  CT/CTA head and neck 1 day prior FINDINGS: MRI HEAD FINDINGS Brain: There is large area of diffusion restriction with associated FLAIR signal abnormality in the right MCA distribution involving the external capsule, insular cortex, temporal lobe, frontal lobe, and parietal consistent with acute infarct. There is also a small area of infarct in the right superior frontal gyrus cortex near the vertex in the ACA distribution. Additional punctate infarcts are seen in the left cerebellar hemisphere, right caudate body/corona radiata, and right frontal lobe white matter. There is no evidence of hemorrhagic transformation. There is swelling with sulcal effacement but no midline  shift. There are punctate chronic microhemorrhages in the right caudate body and right external capsule, nonspecific. The focus in the caudate body is unchanged since 2023. Background parenchymal volume is normal. The ventricles are normal in size. There is no significant underlying chronic small-vessel ischemic change, there is a small remote infarct but in the right cerebral peduncle, unchanged since 2023. The pituitary and suprasellar region are normal. There is no solid mass lesion. Vascular: See below. Skull and upper cervical spine: Normal marrow signal. Sinuses/Orbits: The paranasal sinuses are clear. The globes and orbits are unremarkable. Other: None. MRA HEAD FINDINGS Anterior circulation: The intracranial ICAs are patent. The right M1 segment is normal. There is persistent occlusion of a dominant right M2 Kaisha Wachob in the sylvian fissure (9-111). Patent MCA branches are seen in the frontal operculum region; however, there is otherwise minimal collateral flow in the remainder of the MCA distribution. The left M1 segment and distal branches are patent, without proximal stenosis or occlusion. The bilateral ACAs are patent, without proximal stenosis or occlusion. There is no aneurysm or AVM. Posterior circulation: The bilateral V4 segments are patent. The basilar artery is patent. The major cerebellar arteries appear patent. The bilateral PCAs are patent, primarily supplied by prominent posterior communicating arteries bilaterally (fetal origins). There is no proximal stenosis or occlusion There is no aneurysm or AVM. Anatomic variants: As above. IMPRESSION: 1. Large area of acute infarct in the right MCA distribution, smaller area of acute infarct in the right ACA distribution, and punctate acute infarct in the left cerebellar hemisphere. No evidence of hemorrhagic transformation or midline shift. 2. Persistent occlusion of a right M2 Ashby Moskal with minimal collateral flow in the MCA distribution aside from in the  region of the frontal operculum. Electronically Signed   By: Valetta Mole M.D.   On: 09/25/2022 15:10   ECHOCARDIOGRAM COMPLETE  Result Date: 09/25/2022    ECHOCARDIOGRAM REPORT   Patient Name:   MARTIANA KINDER Date of Exam: 09/25/2022 Medical Rec #:  [id number]       Height:       59.0 in Accession #:    [id number]      Weight:       204.1 lb Date of Birth:  09-Jul-1976        BSA:          1.861 m Patient Age:    61 years        BP:           140/108 mmHg Patient Gender: F  HR:           84 bpm. Exam Location:  Inpatient Procedure: 2D Echo STAT ECHO Indications:    acute systolic chf  History:        Patient has prior history of Echocardiogram examinations, most                 recent 02/23/2022. Cardiomyopathy, Arrythmias:Atrial Fibrillation,                 Signs/Symptoms:Shortness of Breath; Risk Factors:Diabetes,                 Hypertension, Dyslipidemia and Sleep Apnea.  Sonographer:    Johny Chess RDCS Referring Phys: [id number] Patrecia Pour STACK  Sonographer Comments: Echo performed with patient supine and on artificial respirator. IMPRESSIONS  1. 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  2. Left ventricular ejection fraction, by estimation, is 15%. The left ventricle has severely decreased function. The left ventricle demonstrates global hypokinesis. There is mild left ventricular hypertrophy. Left ventricular diastolic parameters are consistent with Grade III diastolic dysfunction (restrictive).  3. Right ventricular systolic function is moderately reduced. The right ventricular size is normal.  4. The mitral valve is grossly normal. Mild mitral valve regurgitation. No evidence of mitral stenosis.  5. Tricuspid valve regurgitation is moderate.  6. The aortic valve is tricuspid. Aortic valve regurgitation is not visualized. No aortic stenosis is present.  7. The inferior vena cava is normal in size with <50% respiratory variability, suggesting right atrial pressure of 8 mmHg, patient  currently intubated on mechanical ventilation. FINDINGS  Left Ventricle: Left ventricular ejection fraction, by estimation, is 15%. The left ventricle has severely decreased function. The left ventricle demonstrates global hypokinesis. The left ventricular internal cavity size was normal in size. There is mild left ventricular hypertrophy. Left ventricular diastolic parameters are consistent with Grade III diastolic dysfunction (restrictive). Right Ventricle: The right ventricular size is normal. No increase in right ventricular wall thickness. Right ventricular systolic function is moderately reduced. The tricuspid regurgitant velocity is 2.68 m/s, and with an assumed right atrial pressure of 8 mmHg, the estimated right ventricular systolic pressure is 123XX123 mmHg. Left Atrium: Left atrial size was normal in size. Right Atrium: Right atrial size was normal in size. Pericardium: There is no evidence of pericardial effusion. Mitral Valve: The mitral valve is grossly normal. Mild mitral valve regurgitation. No evidence of mitral valve stenosis. Tricuspid Valve: The tricuspid valve is normal in structure. Tricuspid valve regurgitation is moderate . No evidence of tricuspid stenosis. Aortic Valve: The aortic valve is tricuspid. Aortic valve regurgitation is not visualized. No aortic stenosis is present. Pulmonic Valve: The pulmonic valve was grossly normal. Pulmonic valve regurgitation is trivial. No evidence of pulmonic stenosis. Aorta: The aortic root and ascending aorta are structurally normal, with no evidence of dilitation. Venous: IVC assessment for right atrial pressure unable to be performed due to mechanical ventilation. The inferior vena cava is normal in size with less than 50% respiratory variability, suggesting right atrial pressure of 8 mmHg. IAS/Shunts: The atrial septum is grossly normal.  LEFT VENTRICLE PLAX 2D LVIDd:         5.10 cm      Diastology LVIDs:         4.80 cm      LV e' medial:    4.03 cm/s  LV PW:         1.30 cm      LV E/e' medial:  16.6 LV IVS:        1.10 cm      LV e' lateral:   3.81 cm/s LVOT diam:     1.90 cm      LV E/e' lateral: 17.5 LV SV:         15 LV SV Index:   8 LVOT Area:     2.84 cm  LV Volumes (MOD) LV vol d, MOD A4C: 119.0 ml LV vol s, MOD A4C: 100.0 ml LV SV MOD A4C:     119.0 ml RIGHT VENTRICLE            IVC RV Basal diam:  2.50 cm    IVC diam: 1.90 cm RV S prime:     5.77 cm/s TAPSE (M-mode): 1.1 cm LEFT ATRIUM             Index        RIGHT ATRIUM           Index LA diam:        3.80 cm 2.04 cm/m   RA Area:     16.10 cm LA Vol (A2C):   57.7 ml 31.01 ml/m  RA Volume:   43.20 ml  23.22 ml/m LA Vol (A4C):   53.7 ml 28.86 ml/m LA Biplane Vol: 56.3 ml 30.26 ml/m  AORTIC VALVE LVOT Vmax:   40.40 cm/s LVOT Vmean:  23.700 cm/s LVOT VTI:    0.053 m  AORTA Ao Root diam: 2.70 cm Ao Asc diam:  2.70 cm MR Peak grad: 53.0 mmHg    TRICUSPID VALVE MR Mean grad: 38.0 mmHg    TR Peak grad:   28.7 mmHg MR Vmax:      364.00 cm/s  TR Vmax:        268.00 cm/s MR Vmean:     294.0 cm/s MV E velocity: 66.85 cm/s  SHUNTS MV A velocity: 29.56 cm/s  Systemic VTI:  0.05 m MV E/A ratio:  2.26        Systemic Diam: 1.90 cm Cherlynn Kaiser MD Electronically signed by Cherlynn Kaiser MD Signature Date/Time: 09/25/2022/11:02:17 AM    Final    DG Abd Portable 1V  Result Date: 09/24/2022 CLINICAL DATA:  OG tube placement EXAM: PORTABLE ABDOMEN - 1 VIEW COMPARISON:  None Available. FINDINGS: Enteric tube terminates in the gastric antrum. IMPRESSION: Enteric tube terminates in the gastric antrum. Electronically Signed   By: Julian Hy M.D.   On: 09/24/2022 20:23   DG CHEST PORT 1 VIEW  Result Date: 09/24/2022 CLINICAL DATA:  Status post intubation. EXAM: PORTABLE CHEST 1 VIEW COMPARISON:  09/22/2022. FINDINGS: Endotracheal tube tip is just above the carina by approximately 7 mm and is directed towards the right mainstem bronchus. Right IJ catheter tip is in the superior cavoatrial junction. No  pneumothorax after catheter placement. Unchanged cardiac enlargement. No significant pleural fluid or airspace disease. Platelike atelectasis noted in the left midlung. IMPRESSION: 1. Low position of endotracheal tube with tip just above the carina and directed towards the right mainstem bronchus. Recommend retraction by 1 cm. 2. Right IJ catheter tip in the superior cavoatrial junction. 3. Cardiac enlargement. These results will be called to the ordering clinician or representative by the Radiologist Assistant, and communication documented in the PACS or [employer]. Electronically Signed   By: Kerby Moors M.D.   On: 09/24/2022 17:50   CT HEAD WO CONTRAST (5MM)  Result Date: 09/24/2022 CLINICAL DATA:  Left-sided weakness and left gaze deviation. EXAM: CT  ANGIOGRAPHY HEAD AND NECK TECHNIQUE: Multidetector CT imaging of the head and neck was performed using the standard protocol during bolus administration of intravenous contrast. Multiplanar CT image reconstructions and MIPs were obtained to evaluate the vascular anatomy. Carotid stenosis measurements (when applicable) are obtained utilizing NASCET criteria, using the distal internal carotid diameter as the denominator. RADIATION DOSE REDUCTION: This exam was performed according to the departmental dose-optimization program which includes automated exposure control, adjustment of the mA and/or kV according to patient size and/or use of iterative reconstruction technique. CONTRAST:  78m OMNIPAQUE IOHEXOL 350 MG/ML SOLN COMPARISON:  Brain MRI 11/07/2021 FINDINGS: CT HEAD FINDINGS Brain: There is questionable loss of gray-white differentiation in the posterior insula suspicious for early infarct in the MCA distribution. The remainder of the MCA territory appears spared at this time. ASPECTS is 9. There is no acute intracranial hemorrhage or extra-axial fluid collection. Background parenchymal volume is normal. The ventricles are normal in size. Probable  prominent perivascular spaces are noted in the bilateral basal ganglia. The pituitary and suprasellar region are normal. There is no mass lesion. There is no mass effect or midline shift. Vascular: See below. Skull: Normal. Negative for fracture or focal lesion. Sinuses/Orbits: There is mild mucosal thickening in the paranasal sinuses. There is a rightward gaze deviation. Other: None. Review of the MIP images confirms the above findings CTA NECK FINDINGS Aortic arch: The imaged aortic arch is normal. The origins of the major Chauntae Hults vessels are patent. The subclavian arteries are patent to the level imaged. Right carotid system: The right common, internal, and external carotid arteries are patent with mild plaque in the common carotid artery and at the bifurcation but no hemodynamically significant stenosis or occlusion. There is no evidence of dissection or aneurysm. Left carotid system: The left common, internal, and external carotid arteries are patent with mild plaque at the bifurcation but no hemodynamically significant stenosis or occlusion. There is no evidence of dissection or aneurysm Vertebral arteries: The vertebral arteries are patent, without hemodynamically significant stenosis or occlusion. There is no evidence of dissection or aneurysm. Skeleton: There is no acute osseous abnormality or suspicious osseous lesion. There is no visible canal hematoma. Other neck: The soft tissues of the neck are unremarkable. Upper chest: There is an incompletely imaged right pleural effusion. The imaged lung apices are otherwise clear. Review of the MIP images confirms the above findings CTA HEAD FINDINGS Anterior circulation: The intracranial ICAs are patent. The right M1 segment is patent. Small patent MCA branches are seen in the region of the frontal operculum; however, there is essentially no other collateral flow in the remainder of the MCA distribution. The left M1 segment and distal branches are patent, without  proximal stenosis or occlusion. The left A1 segment is relatively hypoplastic, likely developmental. The dominant right A1 segment is patent. The distal ACA branches are patent, without proximal stenosis or occlusion. There is no aneurysm or AVM. Posterior circulation: The bilateral V4 segments are patent. The basilar artery is patent, diminutive in caliber. The bilateral PCAs are patent, supplied by posterior communicating arteries bilaterally (fetal origins). There is no aneurysm or AVM. Venous sinuses: Not well evaluated due to bolus timing. Anatomic variants: As above. Review of the MIP images confirms the above findings IMPRESSION: 1. Loss of gray-white differentiation in the insula likely reflecting early infarct. Aspects is 9. 2. Proximal right M2 occlusion with minimal collateral flow throughout the MCA distribution. 3. Patent vasculature of the neck with no hemodynamically significant stenosis or  occlusion. Electronically Signed   By: Valetta Mole M.D.   On: 09/24/2022 14:06   CT ANGIO HEAD NECK W WO CM (CODE STROKE)  Result Date: 09/24/2022 CLINICAL DATA:  Left-sided weakness and left gaze deviation. EXAM: CT ANGIOGRAPHY HEAD AND NECK TECHNIQUE: Multidetector CT imaging of the head and neck was performed using the standard protocol during bolus administration of intravenous contrast. Multiplanar CT image reconstructions and MIPs were obtained to evaluate the vascular anatomy. Carotid stenosis measurements (when applicable) are obtained utilizing NASCET criteria, using the distal internal carotid diameter as the denominator. RADIATION DOSE REDUCTION: This exam was performed according to the departmental dose-optimization program which includes automated exposure control, adjustment of the mA and/or kV according to patient size and/or use of iterative reconstruction technique. CONTRAST:  10m OMNIPAQUE IOHEXOL 350 MG/ML SOLN COMPARISON:  Brain MRI 11/07/2021 FINDINGS: CT HEAD FINDINGS Brain: There is  questionable loss of gray-white differentiation in the posterior insula suspicious for early infarct in the MCA distribution. The remainder of the MCA territory appears spared at this time. ASPECTS is 9. There is no acute intracranial hemorrhage or extra-axial fluid collection. Background parenchymal volume is normal. The ventricles are normal in size. Probable prominent perivascular spaces are noted in the bilateral basal ganglia. The pituitary and suprasellar region are normal. There is no mass lesion. There is no mass effect or midline shift. Vascular: See below. Skull: Normal. Negative for fracture or focal lesion. Sinuses/Orbits: There is mild mucosal thickening in the paranasal sinuses. There is a rightward gaze deviation. Other: None. Review of the MIP images confirms the above findings CTA NECK FINDINGS Aortic arch: The imaged aortic arch is normal. The origins of the major Nevaya Nagele vessels are patent. The subclavian arteries are patent to the level imaged. Right carotid system: The right common, internal, and external carotid arteries are patent with mild plaque in the common carotid artery and at the bifurcation but no hemodynamically significant stenosis or occlusion. There is no evidence of dissection or aneurysm. Left carotid system: The left common, internal, and external carotid arteries are patent with mild plaque at the bifurcation but no hemodynamically significant stenosis or occlusion. There is no evidence of dissection or aneurysm Vertebral arteries: The vertebral arteries are patent, without hemodynamically significant stenosis or occlusion. There is no evidence of dissection or aneurysm. Skeleton: There is no acute osseous abnormality or suspicious osseous lesion. There is no visible canal hematoma. Other neck: The soft tissues of the neck are unremarkable. Upper chest: There is an incompletely imaged right pleural effusion. The imaged lung apices are otherwise clear. Review of the MIP images  confirms the above findings CTA HEAD FINDINGS Anterior circulation: The intracranial ICAs are patent. The right M1 segment is patent. Small patent MCA branches are seen in the region of the frontal operculum; however, there is essentially no other collateral flow in the remainder of the MCA distribution. The left M1 segment and distal branches are patent, without proximal stenosis or occlusion. The left A1 segment is relatively hypoplastic, likely developmental. The dominant right A1 segment is patent. The distal ACA branches are patent, without proximal stenosis or occlusion. There is no aneurysm or AVM. Posterior circulation: The bilateral V4 segments are patent. The basilar artery is patent, diminutive in caliber. The bilateral PCAs are patent, supplied by posterior communicating arteries bilaterally (fetal origins). There is no aneurysm or AVM. Venous sinuses: Not well evaluated due to bolus timing. Anatomic variants: As above. Review of the MIP images confirms the above findings  IMPRESSION: 1. Loss of gray-white differentiation in the insula likely reflecting early infarct. Aspects is 9. 2. Proximal right M2 occlusion with minimal collateral flow throughout the MCA distribution. 3. Patent vasculature of the neck with no hemodynamically significant stenosis or occlusion. Electronically Signed   By: Valetta Mole M.D.   On: 09/24/2022 14:06    Cardiac Studies   TTE 09/25/2022 1. 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   2. Left ventricular ejection fraction, by estimation, is 15%. The left  ventricle has severely decreased function. The left ventricle demonstrates  global hypokinesis. There is mild left ventricular hypertrophy. Left  ventricular diastolic parameters are  consistent with Grade III diastolic dysfunction (restrictive).   3. Right ventricular systolic function is moderately reduced. The right  ventricular size is normal.   4. The mitral valve is grossly normal. Mild mitral valve  regurgitation.  No evidence of mitral stenosis.   5. Tricuspid valve regurgitation is moderate.   6. The aortic valve is tricuspid. Aortic valve regurgitation is not  visualized. No aortic stenosis is present.   7. The inferior vena cava is normal in size with <50% respiratory  variability, suggesting right atrial pressure of 8 mmHg, patient currently  intubated on mechanical ventilation.   Patient Profile     47 y.o. 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%) felt to be nonischemic cardiomyopathy, paroxysmal atrial fibrillation and history of LV thrombus on Xarelto, type 2 DM, anemia, HTN  who is being seen 09/23/2022 for the evaluation of CHF and elevated troponin   Assessment & Plan    Principal Problem:   Acute on chronic combined systolic (congestive) and diastolic (congestive) heart failure (HCC) Active Problems:   Poorly controlled type 2 diabetes mellitus ([facility])   Hypertension associated with diabetes (HCC)   Chronic nausea   Paroxysmal atrial fibrillation (HCC)   Elevated troponin   Hypokalemia   LV (left ventricular) mural thrombus   Anasarca   Middle cerebral artery embolism, right   Acute on Chronic combined systolic and diastolic heart failure  Nonischemic cardiomyopathy  Acute ischemic stroke - Most recent echocardiogram from 02/23/22 showed EF <20%, grade III diastolic dysfunction, moderately reduced RV systolic function, mildly elevated pulmonary artery systolic pressure  - Cardiac MRI 08/2021 showed mildly dilated LV with EF 23%, moderate RV systolic dysfunction. There was no evidence of prior MI, infiltrative disease, myocarditis.  - R/L heart catheterization from 08/2021 showed mild, nonobstructive CAD - Patient has been admitted several times in the past for CHF exacerbation/fluid overload. Often in the setting of medication noncompliance or failure to follow low sodium diet  - Now, patient presented complaining of lower extremity edema,  shortness of breath  - BNP elevated to 688. CXR with cardiomegaly and mild pulmonary vascular congestion  - S/P IV lasix. appears euvolemic - losartan 50 mg daily, Toprol-XL 25 mg daily, spironolactone 25 mg daily, jardiance 10 mg daily- these have all been held in setting of stroke; can continue until extubated - hypotensive while on sedation, BP improved as propofol stopped. No longer requiring phenylephrine. -Patient has not tolerated entresto in the past due to hypotension  - Strict I/Os, daily weights, daily BMPs  - No resolution of the LV thrombus on repeat echo - Maintain K greater than 4, mag greater than 2   Elevated Troponin  - hsTn 197>170  - Suspect this is demand ischemia in the setting of CHF  - Patient does complain of chest  pain, but symptoms are very vague. Troponin trend is not consistent with ACS and patient had clean coronaries on cath from 08/2021. No plans for ischemic evaluation at this time    LV thrombus  Subtherapeutic INR on coumadin  - Echocardiogram from 02/2022 showed an LV thrombus present at apex measuring 2.2 x 1.4 with some mobility  - Patient was on Xarelto at one point, but has more recently been on coumadin  - INR subtherapeutic at 1.3 on 09/22/22. -Given her noncompliance with Coumadin clinic appointments, would recommend switching from Coumadin to Eliquis - INR was subtherapeutic, now with acute embolic stroke. Concerning for involvement of known prior LV thrombus, with nonadherence to warfarin.  - was on IV heparin until stroke. Defer to stroke team on timing of resuming anticoagulation.   Paroxysmal Atrial Fibrillation  - Patient is in sinus tachycardia with HR 100-110 - Resume home metoprolol succinate 25 mg daily as above  - Patient was on coumadin, INR subtherapeutic at 1.3. As above.   Mild Mitral Valve regurgitation  Mild-moderate tricuspid valve regurgitation  - Echocardiogram this admission pending   Otherwise per primary  - Type 2 DM,  poorly controlled  - Chronic nausea   Will see how she recovers, starting to discuss goals of care at this point will be important moving forward. LV thrombus is quite large and if she is challenged with taking eliquis daily, this will not resolve and she will be at risk for recurrent stroke.  For questions or updates, please contact [place] Please consult [email or web address] for contact info under   CRITICAL CARE Performed by: Phineas Inches, MD,MS   Total critical care time: 35 minutes   Critical care time was exclusive of separately billable procedures and treating other patients.   Critical care was necessary to treat or prevent imminent or life-threatening deterioration.   Critical care was time spent personally by me (independent of APPs or residents) on the following activities: development of treatment plan with patient and/or surrogate as well as nursing, discussions with consultants, evaluation of patient's response to treatment, examination of patient, obtaining history from patient or surrogate, ordering and performing treatments and interventions, ordering and review of laboratory studies, ordering and review of radiographic studies, pulse oximetry and re-evaluation of patient's condition.      Signed, Janina Mayo, MD  09/26/2022, 10:48 AM

## 2022-09-26 NOTE — Progress Notes (Signed)
Pt placed on PSV/CPAP by RT. Pt tolerating well at this time, vitals stable, RN aware, RT will monitor.

## 2022-09-26 NOTE — Procedures (Signed)
Cortrak  Person Inserting Tube:  [name] D, RD Tube Type:  Cortrak - 43 inches Tube Size:  10 Tube Location:  Left nare Secured by: Bridle Technique Used to Measure Tube Placement:  Marking at nare/corner of mouth Cortrak Secured At:  64 cm Procedure Comments:  Cortrak Tube Team Note:  Consult received to place a Cortrak feeding tube.   X-ray is required, abdominal x-ray has been ordered by the Cortrak team. Please confirm tube placement before using the Cortrak tube.   If the tube becomes dislodged please keep the tube and contact the Cortrak team at [email or web address] for replacement.  If after hours and replacement cannot be delayed, place a NG tube and confirm placement with an abdominal x-ray.    [name], RD, LDN Clinical Dietitian RD pager # available in [place]  After hours/weekend pager # available in [facility]

## 2022-09-26 NOTE — Progress Notes (Signed)
Heart Failure Navigator Progress Note  Assessed for Heart & Vascular TOC clinic readiness.  Patient was dismissed for the Advanced Heart Failure team. .   Navigator will sign off at this time.   Earnestine Leys, BSN, Clinical cytogeneticist Only

## 2022-09-26 NOTE — Progress Notes (Signed)
Supervising Physician: Luanne Bras  Patient Status:  [facility] - In-pt  Chief Complaint:  Stroke, s/p right common carotid arteriogram  Subjective:  Pt off sedation, still intubated Daughter bedside CCM bedside, reports patient moving right toes voluntarily and following some commands RN bedside, reports patient moves left side to painful stimulus Reportedly more alert yesterday  Allergies: Fish allergy, Shellfish allergy, Metformin and related, Trulicity [dulaglutide], Nsaids, and Advil [ibuprofen]  Medications: Prior to Admission medications   Medication Sig Start Date End Date Taking? Authorizing Provider  acetaminophen (TYLENOL) 500 MG tablet Take 500-1,000 mg by mouth every 6 (six) hours as needed for mild pain or headache.   Yes [provider]  atorvastatin (LIPITOR) 80 MG tablet Take 1 tablet (80 mg total) by mouth daily. 09/08/22  Yes Janina Mayo, MD  empagliflozin (JARDIANCE) 10 MG TABS tablet Take 1 tablet (10 mg total) by mouth daily. 07/01/22  Yes Amin, Ankit Chirag, MD  insulin aspart (NOVOLOG FLEXPEN) 100 UNIT/ML FlexPen Inject 12 Units into the skin daily before supper. 07/01/22  Yes Amin, Ankit Chirag, MD  insulin glargine (LANTUS) 100 UNIT/ML Solostar Pen Inject 15 Units into the skin daily. 07/01/22  Yes Amin, Jeanella Flattery, MD  losartan (COZAAR) 50 MG tablet Take 1 tablet (50 mg total) by mouth daily. 07/01/22  Yes Amin, Jeanella Flattery, MD  warfarin (COUMADIN) 7.5 MG tablet Take 1-2 tablets (7.5-15 mg total) by mouth daily as directed by [facility] Patient taking differently: Take 7.5-11.25 mg by mouth as directed. Take 1 tablet (7.5 mg) tablet by mouth every Monday & Take 1.5 tablets (11.25 mg) all other days 07/01/22  Yes Amin, Jeanella Flattery, MD  Accu-Chek Softclix Lancets lancets Use as instructed 02/12/19   Caroline More, DO  Accu-Chek Softclix Lancets lancets Use up 4 (four) times daily as directed 06/06/22   [place] Donnamarie Poag, DO  blood glucose  meter kit and supplies Use up to 4 (four) times daily as directed 06/06/22   [place] Donnamarie Poag, DO  ferrous sulfate 325 (65 FE) MG tablet Take 1 tablet (325 mg total) by mouth daily with breakfast. Patient not taking: Reported on 09/23/2022 07/02/22   Damita Lack, MD  glucose blood (ONETOUCH VERIO) test strip Use up to 4 (four) times daily as directed 06/06/22   [place] Donnamarie Poag, DO  hydrOXYzine (ATARAX) 10 MG tablet Take 1 tablet (10 mg total) by mouth 3 (three) times daily as needed. Patient taking differently: Take 10 mg by mouth 3 (three) times daily as needed for anxiety. 10/22/21   Gladys Damme, MD  Insulin Pen Needle (UNIFINE PENTIPS) 31G X 5 MM MISC USE AS DIRECTED 2 TIMES DAILY 09/02/21   Hensel, Jamal Collin, MD  metoCLOPramide (REGLAN) 5 MG tablet Take 1 tablet (5 mg total) by mouth 4 (four) times daily -  before meals and at bedtime. Patient not taking: Reported on 09/23/2022 08/09/22   Precious Gilding, DO  metoprolol succinate (TOPROL-XL) 25 MG 24 hr tablet Take 1 tablet (25 mg total) by mouth daily. 07/01/22   Amin, Jeanella Flattery, MD  polyethylene glycol (MIRALAX / GLYCOLAX) 17 g packet Take 17 g by mouth daily as needed for mild constipation. 06/06/22   [place] Donnamarie Poag, DO  prochlorperazine (COMPAZINE) 5 MG tablet Take 1 tablet (5 mg total) by mouth every 6 (six) hours as needed for refractory nausea / vomiting. 08/09/22   Precious Gilding, DO  senna-docusate (SENOKOT-S) 8.6-50 MG tablet Take 1 tablet by mouth at bedtime  as needed for moderate constipation. 07/01/22   Amin, Jeanella Flattery, MD  sertraline (ZOLOFT) 50 MG tablet Take 50 mg by mouth daily.    [provider]  spironolactone (ALDACTONE) 25 MG tablet Take 25 mg by mouth daily. Take 1 Tablet Daily    [provider]  Torsemide 40 MG TABS Take 40 mg by mouth 2 (two) times daily. 09/08/22   Janina Mayo, MD  traZODone (DESYREL) 50 MG tablet Take 1 tablet (50 mg total) by mouth at bedtime. 07/01/22   Damita Lack, MD   carvedilol (COREG) 3.125 MG tablet Take 1 tablet (3.125 mg total) by mouth 2 (two) times daily with a meal. 02/27/22 07/01/22  Oswald Hillock, MD     Vital Signs: BP (!) 134/96   Pulse 81   Temp 98.1 F (36.7 C) (Oral)   Resp 18   Ht 4' 11.02" (1.499 m)   Wt 202 lb 2.6 oz (91.7 kg)   LMP 06/22/2022 Comment: patient states no chance of pregnancy  SpO2 100%   BMI 40.81 kg/m   Physical Exam Vitals reviewed.  Constitutional:      Interventions: She is intubated.  HENT:     Mouth/Throat:     Mouth: Mucous membranes are dry.  Cardiovascular:     Rate and Rhythm: Normal rate.     Pulses: Normal pulses.     Comments: R CFA site soft and without hematoma. Pulmonary:     Effort: She is intubated.  Skin:    General: Skin is warm and dry.  Neurological:     Comments: Opens and closes eyes on command; attempts to move right foot when asked; no movement noted on left side     Imaging: MR BRAIN WO CONTRAST  Result Date: 09/25/2022 CLINICAL DATA:  Stroke follow-up EXAM: MRI HEAD WITHOUT CONTRAST MRA HEAD WITHOUT CONTRAST TECHNIQUE: Multiplanar, multi-echo pulse sequences of the brain and surrounding structures were acquired without intravenous contrast. Angiographic images of the Circle of Willis were acquired using MRA technique without intravenous contrast. COMPARISON:  CT/CTA head and neck 1 day prior FINDINGS: MRI HEAD FINDINGS Brain: There is large area of diffusion restriction with associated FLAIR signal abnormality in the right MCA distribution involving the external capsule, insular cortex, temporal lobe, frontal lobe, and parietal consistent with acute infarct. There is also a small area of infarct in the right superior frontal gyrus cortex near the vertex in the ACA distribution. Additional punctate infarcts are seen in the left cerebellar hemisphere, right caudate body/corona radiata, and right frontal lobe white matter. There is no evidence of hemorrhagic transformation. There is  swelling with sulcal effacement but no midline shift. There are punctate chronic microhemorrhages in the right caudate body and right external capsule, nonspecific. The focus in the caudate body is unchanged since 2023. Background parenchymal volume is normal. The ventricles are normal in size. There is no significant underlying chronic small-vessel ischemic change, there is a small remote infarct but in the right cerebral peduncle, unchanged since 2023. The pituitary and suprasellar region are normal. There is no solid mass lesion. Vascular: See below. Skull and upper cervical spine: Normal marrow signal. Sinuses/Orbits: The paranasal sinuses are clear. The globes and orbits are unremarkable. Other: None. MRA HEAD FINDINGS Anterior circulation: The intracranial ICAs are patent. The right M1 segment is normal. There is persistent occlusion of a dominant right M2 branch in the sylvian fissure (9-111). Patent MCA branches are seen in the frontal operculum region; however, there  is otherwise minimal collateral flow in the remainder of the MCA distribution. The left M1 segment and distal branches are patent, without proximal stenosis or occlusion. The bilateral ACAs are patent, without proximal stenosis or occlusion. There is no aneurysm or AVM. Posterior circulation: The bilateral V4 segments are patent. The basilar artery is patent. The major cerebellar arteries appear patent. The bilateral PCAs are patent, primarily supplied by prominent posterior communicating arteries bilaterally (fetal origins). There is no proximal stenosis or occlusion There is no aneurysm or AVM. Anatomic variants: As above. IMPRESSION: 1. Large area of acute infarct in the right MCA distribution, smaller area of acute infarct in the right ACA distribution, and punctate acute infarct in the left cerebellar hemisphere. No evidence of hemorrhagic transformation or midline shift. 2. Persistent occlusion of a right M2 branch with minimal collateral  flow in the MCA distribution aside from in the region of the frontal operculum. Electronically Signed   By: Valetta Mole M.D.   On: 09/25/2022 15:10   MR ANGIO HEAD WO CONTRAST  Result Date: 09/25/2022 CLINICAL DATA:  Stroke follow-up EXAM: MRI HEAD WITHOUT CONTRAST MRA HEAD WITHOUT CONTRAST TECHNIQUE: Multiplanar, multi-echo pulse sequences of the brain and surrounding structures were acquired without intravenous contrast. Angiographic images of the Circle of Willis were acquired using MRA technique without intravenous contrast. COMPARISON:  CT/CTA head and neck 1 day prior FINDINGS: MRI HEAD FINDINGS Brain: There is large area of diffusion restriction with associated FLAIR signal abnormality in the right MCA distribution involving the external capsule, insular cortex, temporal lobe, frontal lobe, and parietal consistent with acute infarct. There is also a small area of infarct in the right superior frontal gyrus cortex near the vertex in the ACA distribution. Additional punctate infarcts are seen in the left cerebellar hemisphere, right caudate body/corona radiata, and right frontal lobe white matter. There is no evidence of hemorrhagic transformation. There is swelling with sulcal effacement but no midline shift. There are punctate chronic microhemorrhages in the right caudate body and right external capsule, nonspecific. The focus in the caudate body is unchanged since 2023. Background parenchymal volume is normal. The ventricles are normal in size. There is no significant underlying chronic small-vessel ischemic change, there is a small remote infarct but in the right cerebral peduncle, unchanged since 2023. The pituitary and suprasellar region are normal. There is no solid mass lesion. Vascular: See below. Skull and upper cervical spine: Normal marrow signal. Sinuses/Orbits: The paranasal sinuses are clear. The globes and orbits are unremarkable. Other: None. MRA HEAD FINDINGS Anterior circulation: The  intracranial ICAs are patent. The right M1 segment is normal. There is persistent occlusion of a dominant right M2 branch in the sylvian fissure (9-111). Patent MCA branches are seen in the frontal operculum region; however, there is otherwise minimal collateral flow in the remainder of the MCA distribution. The left M1 segment and distal branches are patent, without proximal stenosis or occlusion. The bilateral ACAs are patent, without proximal stenosis or occlusion. There is no aneurysm or AVM. Posterior circulation: The bilateral V4 segments are patent. The basilar artery is patent. The major cerebellar arteries appear patent. The bilateral PCAs are patent, primarily supplied by prominent posterior communicating arteries bilaterally (fetal origins). There is no proximal stenosis or occlusion There is no aneurysm or AVM. Anatomic variants: As above. IMPRESSION: 1. Large area of acute infarct in the right MCA distribution, smaller area of acute infarct in the right ACA distribution, and punctate acute infarct in the left cerebellar hemisphere.  No evidence of hemorrhagic transformation or midline shift. 2. Persistent occlusion of a right M2 branch with minimal collateral flow in the MCA distribution aside from in the region of the frontal operculum. Electronically Signed   By: Valetta Mole M.D.   On: 09/25/2022 15:10   ECHOCARDIOGRAM COMPLETE  Result Date: 09/25/2022    ECHOCARDIOGRAM REPORT   Patient Name:   [name] Date of Exam: 09/25/2022 Medical Rec #:  [id number]       Height:       59.0 in Accession #:    [id number]      Weight:       204.1 lb Date of Birth:  11-02-1975        BSA:          1.861 m Patient Age:    47 years        BP:           140/108 mmHg Patient Gender: F               HR:           84 bpm. Exam Location:  Inpatient Procedure: 2D Echo STAT ECHO Indications:    acute systolic chf  History:        Patient has prior history of Echocardiogram examinations, most                 recent  02/23/2022. Cardiomyopathy, Arrythmias:Atrial Fibrillation,                 Signs/Symptoms:Shortness of Breath; Risk Factors:Diabetes,                 Hypertension, Dyslipidemia and Sleep Apnea.  Sonographer:    Johny Chess RDCS Referring Phys: [id number] Patrecia Pour STACK  Sonographer Comments: Echo performed with patient supine and on artificial respirator. IMPRESSIONS  1. 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  2. Left ventricular ejection fraction, by estimation, is 15%. The left ventricle has severely decreased function. The left ventricle demonstrates global hypokinesis. There is mild left ventricular hypertrophy. Left ventricular diastolic parameters are consistent with Grade III diastolic dysfunction (restrictive).  3. Right ventricular systolic function is moderately reduced. The right ventricular size is normal.  4. The mitral valve is grossly normal. Mild mitral valve regurgitation. No evidence of mitral stenosis.  5. Tricuspid valve regurgitation is moderate.  6. The aortic valve is tricuspid. Aortic valve regurgitation is not visualized. No aortic stenosis is present.  7. The inferior vena cava is normal in size with <50% respiratory variability, suggesting right atrial pressure of 8 mmHg, patient currently intubated on mechanical ventilation. FINDINGS  Left Ventricle: Left ventricular ejection fraction, by estimation, is 15%. The left ventricle has severely decreased function. The left ventricle demonstrates global hypokinesis. The left ventricular internal cavity size was normal in size. There is mild left ventricular hypertrophy. Left ventricular diastolic parameters are consistent with Grade III diastolic dysfunction (restrictive). Right Ventricle: The right ventricular size is normal. No increase in right ventricular wall thickness. Right ventricular systolic function is moderately reduced. The tricuspid regurgitant velocity is 2.68 m/s, and with an assumed right atrial pressure of 8 mmHg, the  estimated right ventricular systolic pressure is 123XX123 mmHg. Left Atrium: Left atrial size was normal in size. Right Atrium: Right atrial size was normal in size. Pericardium: There is no evidence of pericardial effusion. Mitral Valve: The mitral valve is grossly normal. Mild mitral valve regurgitation. No evidence of mitral valve stenosis.  Tricuspid Valve: The tricuspid valve is normal in structure. Tricuspid valve regurgitation is moderate . No evidence of tricuspid stenosis. Aortic Valve: The aortic valve is tricuspid. Aortic valve regurgitation is not visualized. No aortic stenosis is present. Pulmonic Valve: The pulmonic valve was grossly normal. Pulmonic valve regurgitation is trivial. No evidence of pulmonic stenosis. Aorta: The aortic root and ascending aorta are structurally normal, with no evidence of dilitation. Venous: IVC assessment for right atrial pressure unable to be performed due to mechanical ventilation. The inferior vena cava is normal in size with less than 50% respiratory variability, suggesting right atrial pressure of 8 mmHg. IAS/Shunts: The atrial septum is grossly normal.  LEFT VENTRICLE PLAX 2D LVIDd:         5.10 cm      Diastology LVIDs:         4.80 cm      LV e' medial:    4.03 cm/s LV PW:         1.30 cm      LV E/e' medial:  16.6 LV IVS:        1.10 cm      LV e' lateral:   3.81 cm/s LVOT diam:     1.90 cm      LV E/e' lateral: 17.5 LV SV:         15 LV SV Index:   8 LVOT Area:     2.84 cm  LV Volumes (MOD) LV vol d, MOD A4C: 119.0 ml LV vol s, MOD A4C: 100.0 ml LV SV MOD A4C:     119.0 ml RIGHT VENTRICLE            IVC RV Basal diam:  2.50 cm    IVC diam: 1.90 cm RV S prime:     5.77 cm/s TAPSE (M-mode): 1.1 cm LEFT ATRIUM             Index        RIGHT ATRIUM           Index LA diam:        3.80 cm 2.04 cm/m   RA Area:     16.10 cm LA Vol (A2C):   57.7 ml 31.01 ml/m  RA Volume:   43.20 ml  23.22 ml/m LA Vol (A4C):   53.7 ml 28.86 ml/m LA Biplane Vol: 56.3 ml 30.26 ml/m   AORTIC VALVE LVOT Vmax:   40.40 cm/s LVOT Vmean:  23.700 cm/s LVOT VTI:    0.053 m  AORTA Ao Root diam: 2.70 cm Ao Asc diam:  2.70 cm MR Peak grad: 53.0 mmHg    TRICUSPID VALVE MR Mean grad: 38.0 mmHg    TR Peak grad:   28.7 mmHg MR Vmax:      364.00 cm/s  TR Vmax:        268.00 cm/s MR Vmean:     294.0 cm/s MV E velocity: 66.85 cm/s  SHUNTS MV A velocity: 29.56 cm/s  Systemic VTI:  0.05 m MV E/A ratio:  2.26        Systemic Diam: 1.90 cm Cherlynn Kaiser MD Electronically signed by Cherlynn Kaiser MD Signature Date/Time: 09/25/2022/11:02:17 AM    Final    DG Abd Portable 1V  Result Date: 09/24/2022 CLINICAL DATA:  OG tube placement EXAM: PORTABLE ABDOMEN - 1 VIEW COMPARISON:  None Available. FINDINGS: Enteric tube terminates in the gastric antrum. IMPRESSION: Enteric tube terminates in the gastric antrum. Electronically Signed   By: Henderson Newcomer.D.  On: 09/24/2022 20:23   DG CHEST PORT 1 VIEW  Result Date: 09/24/2022 CLINICAL DATA:  Status post intubation. EXAM: PORTABLE CHEST 1 VIEW COMPARISON:  09/22/2022. FINDINGS: Endotracheal tube tip is just above the carina by approximately 7 mm and is directed towards the right mainstem bronchus. Right IJ catheter tip is in the superior cavoatrial junction. No pneumothorax after catheter placement. Unchanged cardiac enlargement. No significant pleural fluid or airspace disease. Platelike atelectasis noted in the left midlung. IMPRESSION: 1. Low position of endotracheal tube with tip just above the carina and directed towards the right mainstem bronchus. Recommend retraction by 1 cm. 2. Right IJ catheter tip in the superior cavoatrial junction. 3. Cardiac enlargement. These results will be called to the ordering clinician or representative by the Radiologist Assistant, and communication documented in the PACS or [employer]. Electronically Signed   By: Kerby Moors M.D.   On: 09/24/2022 17:50   CT HEAD WO CONTRAST (5MM)  Result Date: 09/24/2022 CLINICAL  DATA:  Left-sided weakness and left gaze deviation. EXAM: CT ANGIOGRAPHY HEAD AND NECK TECHNIQUE: Multidetector CT imaging of the head and neck was performed using the standard protocol during bolus administration of intravenous contrast. Multiplanar CT image reconstructions and MIPs were obtained to evaluate the vascular anatomy. Carotid stenosis measurements (when applicable) are obtained utilizing NASCET criteria, using the distal internal carotid diameter as the denominator. RADIATION DOSE REDUCTION: This exam was performed according to the departmental dose-optimization program which includes automated exposure control, adjustment of the mA and/or kV according to patient size and/or use of iterative reconstruction technique. CONTRAST:  73m OMNIPAQUE IOHEXOL 350 MG/ML SOLN COMPARISON:  Brain MRI 11/07/2021 FINDINGS: CT HEAD FINDINGS Brain: There is questionable loss of gray-white differentiation in the posterior insula suspicious for early infarct in the MCA distribution. The remainder of the MCA territory appears spared at this time. ASPECTS is 9. There is no acute intracranial hemorrhage or extra-axial fluid collection. Background parenchymal volume is normal. The ventricles are normal in size. Probable prominent perivascular spaces are noted in the bilateral basal ganglia. The pituitary and suprasellar region are normal. There is no mass lesion. There is no mass effect or midline shift. Vascular: See below. Skull: Normal. Negative for fracture or focal lesion. Sinuses/Orbits: There is mild mucosal thickening in the paranasal sinuses. There is a rightward gaze deviation. Other: None. Review of the MIP images confirms the above findings CTA NECK FINDINGS Aortic arch: The imaged aortic arch is normal. The origins of the major branch vessels are patent. The subclavian arteries are patent to the level imaged. Right carotid system: The right common, internal, and external carotid arteries are patent with mild  plaque in the common carotid artery and at the bifurcation but no hemodynamically significant stenosis or occlusion. There is no evidence of dissection or aneurysm. Left carotid system: The left common, internal, and external carotid arteries are patent with mild plaque at the bifurcation but no hemodynamically significant stenosis or occlusion. There is no evidence of dissection or aneurysm Vertebral arteries: The vertebral arteries are patent, without hemodynamically significant stenosis or occlusion. There is no evidence of dissection or aneurysm. Skeleton: There is no acute osseous abnormality or suspicious osseous lesion. There is no visible canal hematoma. Other neck: The soft tissues of the neck are unremarkable. Upper chest: There is an incompletely imaged right pleural effusion. The imaged lung apices are otherwise clear. Review of the MIP images confirms the above findings CTA HEAD FINDINGS Anterior circulation: The intracranial ICAs are patent.  The right M1 segment is patent. Small patent MCA branches are seen in the region of the frontal operculum; however, there is essentially no other collateral flow in the remainder of the MCA distribution. The left M1 segment and distal branches are patent, without proximal stenosis or occlusion. The left A1 segment is relatively hypoplastic, likely developmental. The dominant right A1 segment is patent. The distal ACA branches are patent, without proximal stenosis or occlusion. There is no aneurysm or AVM. Posterior circulation: The bilateral V4 segments are patent. The basilar artery is patent, diminutive in caliber. The bilateral PCAs are patent, supplied by posterior communicating arteries bilaterally (fetal origins). There is no aneurysm or AVM. Venous sinuses: Not well evaluated due to bolus timing. Anatomic variants: As above. Review of the MIP images confirms the above findings IMPRESSION: 1. Loss of gray-white differentiation in the insula likely reflecting  early infarct. Aspects is 9. 2. Proximal right M2 occlusion with minimal collateral flow throughout the MCA distribution. 3. Patent vasculature of the neck with no hemodynamically significant stenosis or occlusion. Electronically Signed   By: Valetta Mole M.D.   On: 09/24/2022 14:06   CT ANGIO HEAD NECK W WO CM (CODE STROKE)  Result Date: 09/24/2022 CLINICAL DATA:  Left-sided weakness and left gaze deviation. EXAM: CT ANGIOGRAPHY HEAD AND NECK TECHNIQUE: Multidetector CT imaging of the head and neck was performed using the standard protocol during bolus administration of intravenous contrast. Multiplanar CT image reconstructions and MIPs were obtained to evaluate the vascular anatomy. Carotid stenosis measurements (when applicable) are obtained utilizing NASCET criteria, using the distal internal carotid diameter as the denominator. RADIATION DOSE REDUCTION: This exam was performed according to the departmental dose-optimization program which includes automated exposure control, adjustment of the mA and/or kV according to patient size and/or use of iterative reconstruction technique. CONTRAST:  42m OMNIPAQUE IOHEXOL 350 MG/ML SOLN COMPARISON:  Brain MRI 11/07/2021 FINDINGS: CT HEAD FINDINGS Brain: There is questionable loss of gray-white differentiation in the posterior insula suspicious for early infarct in the MCA distribution. The remainder of the MCA territory appears spared at this time. ASPECTS is 9. There is no acute intracranial hemorrhage or extra-axial fluid collection. Background parenchymal volume is normal. The ventricles are normal in size. Probable prominent perivascular spaces are noted in the bilateral basal ganglia. The pituitary and suprasellar region are normal. There is no mass lesion. There is no mass effect or midline shift. Vascular: See below. Skull: Normal. Negative for fracture or focal lesion. Sinuses/Orbits: There is mild mucosal thickening in the paranasal sinuses. There is a  rightward gaze deviation. Other: None. Review of the MIP images confirms the above findings CTA NECK FINDINGS Aortic arch: The imaged aortic arch is normal. The origins of the major branch vessels are patent. The subclavian arteries are patent to the level imaged. Right carotid system: The right common, internal, and external carotid arteries are patent with mild plaque in the common carotid artery and at the bifurcation but no hemodynamically significant stenosis or occlusion. There is no evidence of dissection or aneurysm. Left carotid system: The left common, internal, and external carotid arteries are patent with mild plaque at the bifurcation but no hemodynamically significant stenosis or occlusion. There is no evidence of dissection or aneurysm Vertebral arteries: The vertebral arteries are patent, without hemodynamically significant stenosis or occlusion. There is no evidence of dissection or aneurysm. Skeleton: There is no acute osseous abnormality or suspicious osseous lesion. There is no visible canal hematoma. Other neck: The soft tissues  of the neck are unremarkable. Upper chest: There is an incompletely imaged right pleural effusion. The imaged lung apices are otherwise clear. Review of the MIP images confirms the above findings CTA HEAD FINDINGS Anterior circulation: The intracranial ICAs are patent. The right M1 segment is patent. Small patent MCA branches are seen in the region of the frontal operculum; however, there is essentially no other collateral flow in the remainder of the MCA distribution. The left M1 segment and distal branches are patent, without proximal stenosis or occlusion. The left A1 segment is relatively hypoplastic, likely developmental. The dominant right A1 segment is patent. The distal ACA branches are patent, without proximal stenosis or occlusion. There is no aneurysm or AVM. Posterior circulation: The bilateral V4 segments are patent. The basilar artery is patent, diminutive  in caliber. The bilateral PCAs are patent, supplied by posterior communicating arteries bilaterally (fetal origins). There is no aneurysm or AVM. Venous sinuses: Not well evaluated due to bolus timing. Anatomic variants: As above. Review of the MIP images confirms the above findings IMPRESSION: 1. Loss of gray-white differentiation in the insula likely reflecting early infarct. Aspects is 9. 2. Proximal right M2 occlusion with minimal collateral flow throughout the MCA distribution. 3. Patent vasculature of the neck with no hemodynamically significant stenosis or occlusion. Electronically Signed   By: Valetta Mole M.D.   On: 09/24/2022 14:06   CT Angio Chest PE W and/or Wo Contrast  Result Date: 09/22/2022 CLINICAL DATA:  Pulmonary embolism suspected, high probability. Bilateral lower extremity edema and shortness of breath. EXAM: CT ANGIOGRAPHY CHEST WITH CONTRAST TECHNIQUE: Multidetector CT imaging of the chest was performed using the standard protocol during bolus administration of intravenous contrast. Multiplanar CT image reconstructions and MIPs were obtained to evaluate the vascular anatomy. RADIATION DOSE REDUCTION: This exam was performed according to the departmental dose-optimization program which includes automated exposure control, adjustment of the mA and/or kV according to patient size and/or use of iterative reconstruction technique. CONTRAST:  164m OMNIPAQUE IOHEXOL 350 MG/ML SOLN COMPARISON:  05/06/2022. FINDINGS: Cardiovascular: The heart is enlarged and there is no pericardial effusion. Scattered coronary artery calcifications are noted. There is atherosclerotic calcification of the aorta without evidence of aneurysm. The pulmonary trunk is normal in caliber. No pulmonary artery filling defect is seen. Mediastinum/Nodes: No mediastinal, hilar, or axillary lymphadenopathy by size criteria. The thyroid gland, trachea, and esophagus are within normal limits. Lungs/Pleura: There is a small right  pleural effusion. Mild central bronchial wall thickening is noted bilaterally. Atelectasis is noted bilaterally. No pneumothorax. Upper Abdomen: No acute abnormality. Musculoskeletal: Anasarca is noted. Degenerative changes are present in the thoracic spine. No acute osseous abnormality. Review of the MIP images confirms the above findings. IMPRESSION: 1. No evidence of pulmonary embolism. 2. Mild central bronchial wall thickening bilaterally which may be infectious or inflammatory. 3. Small right pleural effusion. 4. Cardiomegaly with coronary artery calcifications. 5. Aortic atherosclerosis. 6. Anasarca. Electronically Signed   By: LBrett FairyM.D.   On: 09/22/2022 23:25   DG Chest 2 View  Result Date: 09/22/2022 CLINICAL DATA:  Shortness of breath, heart failure, extremity swelling. EXAM: CHEST - 2 VIEW COMPARISON:  08/13/2022. FINDINGS: Heart is enlarged and the mediastinal contour is within normal limits. The pulmonary vasculature is mildly distended. Subsegmental atelectasis is noted in the mid left lung. No effusion or pneumothorax. No acute osseous abnormality. IMPRESSION: Cardiomegaly with mild pulmonary vascular congestion. Electronically Signed   By: LBrett FairyM.D.   On: 09/22/2022 20:29  Labs:  CBC: Recent Labs    09/23/22 0514 09/24/22 0130 09/25/22 0508 09/26/22 0413  WBC 7.5 6.7 7.9 10.3  HGB 12.4 12.3 12.5 12.1  HCT 39.6 39.4 38.5 37.1  PLT 228 213 234 233    COAGS: Recent Labs    02/22/22 1921 02/23/22 0601 05/06/22 0908 06/02/22 1613 06/03/22 0419 08/07/22 0050 08/08/22 0031 08/09/22 0044 09/22/22 2134  INR  --    < > 1.9* 3.5*   < > 1.6* 2.3* 3.4* 1.3*  APTT 22*  --  29 33  --   --   --   --   --    < > = values in this interval not displayed.    BMP: Recent Labs    09/24/22 0130 09/24/22 2231 09/25/22 0508 09/26/22 0413  NA 133* 135 136 136  K 3.3* 4.1 4.6 4.8  CL 96* 96* 96* 98  CO2 '28 27 26 24  '$ GLUCOSE 98 151* 140* 141*  BUN '12 12 13 16   '$ CALCIUM 8.4* 8.6* 8.9 8.6*  CREATININE 1.04* 1.08* 1.14* 1.18*  GFRNONAA >60 >60 >60 58*    LIVER FUNCTION TESTS: Recent Labs    06/24/22 1848 06/26/22 0537 06/27/22 0425 08/05/22 1230  BILITOT 2.8* 2.3* 1.8* 4.1*  AST 23 31 55* 34  ALT '14 13 16 13  '$ ALKPHOS 151* 144* 165* 204*  PROT 8.1 6.9 7.2 7.4  ALBUMIN 3.1* 2.6* 2.7* 2.8*    Assessment and Plan:  Stroke 2 days s/p R common carotid angiogram without intervention R CFA site soft, without hematoma Pt remains intubated, minimally responsive off sedation. Further care directed by Primary  NIR available as necessary.  Electronically Signed: Pasty Spillers, PA 09/26/2022, 8:51 AM   I spent a total of 15 Minutes at the the patient's bedside AND on the patient's hospital floor or unit, greater than 50% of which was counseling/coordinating care for post angiogram follow up

## 2022-09-27 DIAGNOSIS — J9601 Acute respiratory failure with hypoxia: Secondary | ICD-10-CM

## 2022-09-27 DIAGNOSIS — I63411 Cerebral infarction due to embolism of right middle cerebral artery: Secondary | ICD-10-CM | POA: Diagnosis not present

## 2022-09-27 DIAGNOSIS — I513 Intracardiac thrombosis, not elsewhere classified: Secondary | ICD-10-CM | POA: Diagnosis not present

## 2022-09-27 DIAGNOSIS — I480 Paroxysmal atrial fibrillation: Secondary | ICD-10-CM | POA: Diagnosis not present

## 2022-09-27 DIAGNOSIS — I5043 Acute on chronic combined systolic (congestive) and diastolic (congestive) heart failure: Secondary | ICD-10-CM | POA: Diagnosis not present

## 2022-09-27 LAB — GLUCOSE, CAPILLARY
Glucose-Capillary: 125 mg/dL — ABNORMAL HIGH (ref 70–99)
Glucose-Capillary: 125 mg/dL — ABNORMAL HIGH (ref 70–99)
Glucose-Capillary: 159 mg/dL — ABNORMAL HIGH (ref 70–99)
Glucose-Capillary: 167 mg/dL — ABNORMAL HIGH (ref 70–99)
Glucose-Capillary: 173 mg/dL — ABNORMAL HIGH (ref 70–99)
Glucose-Capillary: 179 mg/dL — ABNORMAL HIGH (ref 70–99)

## 2022-09-27 LAB — BASIC METABOLIC PANEL
Anion gap: 9 (ref 5–15)
BUN: 21 mg/dL — ABNORMAL HIGH (ref 6–20)
CO2: 26 mmol/L (ref 22–32)
Calcium: 8.3 mg/dL — ABNORMAL LOW (ref 8.9–10.3)
Chloride: 100 mmol/L (ref 98–111)
Creatinine, Ser: 0.92 mg/dL (ref 0.44–1.00)
GFR, Estimated: >60 mL/min (ref >60)
Glucose, Bld: 138 mg/dL — ABNORMAL HIGH (ref 70–99)
Potassium: 4.1 mmol/L (ref 3.5–5.1)
Sodium: 135 mmol/L (ref 135–145)

## 2022-09-27 LAB — CBC
HCT: 36 % (ref 36.0–46.0)
Hemoglobin: 11.9 g/dL — ABNORMAL LOW (ref 12.0–15.0)
MCH: 23.2 pg — ABNORMAL LOW (ref 26.0–34.0)
MCHC: 33.1 g/dL (ref 30.0–36.0)
MCV: 70.2 fL — ABNORMAL LOW (ref 80.0–100.0)
Platelets: 236 10*3/uL (ref 150–400)
RBC: 5.13 MIL/uL — ABNORMAL HIGH (ref 3.87–5.11)
RDW: 23.9 % — ABNORMAL HIGH (ref 11.5–15.5)
WBC: 12.4 10*3/uL — ABNORMAL HIGH (ref 4.0–10.5)
nRBC: 0 % (ref 0.0–0.2)

## 2022-09-27 LAB — MAGNESIUM
Magnesium: 2.3 mg/dL (ref 1.7–2.4)
Magnesium: 2.3 mg/dL (ref 1.7–2.4)

## 2022-09-27 LAB — PHOSPHORUS
Phosphorus: 4.3 mg/dL (ref 2.5–4.6)
Phosphorus: 3.9 mg/dL (ref 2.5–4.6)

## 2022-09-27 LAB — HEPARIN LEVEL (UNFRACTIONATED): Heparin Unfractionated: 0.42 IU/mL (ref 0.30–0.70)

## 2022-09-27 MED ORDER — PHENOL 1.4 % MT LIQD
1.0000 | OROMUCOSAL | Status: DC | PRN
  Administered 2022-09-27 – 2022-09-29 (×3): 1 via OROMUCOSAL
  Filled 2022-09-27: qty 177

## 2022-09-27 MED ORDER — EMPAGLIFLOZIN 10 MG PO TABS: 10.0000 mg | ORAL_TABLET | Freq: Every day | ORAL | Status: DC

## 2022-09-27 MED ORDER — POLYETHYLENE GLYCOL 3350 17 G PO PACK: 17.0000 g | PACK | Freq: Every day | ORAL | Status: DC

## 2022-09-27 MED ORDER — ORAL CARE MOUTH RINSE
15.0000 mL | OROMUCOSAL | Status: DC
  Administered 2022-09-27 – 2022-10-01 (×22): 15 mL via OROMUCOSAL

## 2022-09-27 NOTE — Evaluation (Signed)
Occupational Therapy Evaluation Patient Details Name: [name] MRN: [id number] DOB: [birth year] Today's Date: 09/27/2022   History of Present Illness 47 y.o. female admitted 2/29 for SOB related to acute on chronic CHF. 3/2 R MCA CVA with small L cerebellar CVA, NIHSS=16, to IR for thrombectomy but was unsuccessful, cardiac arrest during procedure with <1 minute of chest compressions, remained intubated post-procedure and self-extubated 3/5; PMH: CAD, DM2, HTN, a fib, asthma, anxiety, depression   Clinical Impression   PT admitted with CVA after acute CHF admission with cardiac arrest during IR procedure. Pt currently with functional limitiations due to the deficits listed below (see OT problem list). Pt was independent on admission and no recommendation on 3/2. Pt now is dependent for all care and L side hemiplegia noted. Recommendation for resting hand splint for night time wear.  Pt will benefit from skilled OT to increase their independence and safety with adls and balance to allow discharge CIR .       Recommendations for follow up therapy are one component of a multi-disciplinary discharge planning process, led by the attending physician.  Recommendations may be updated based on patient status, additional functional criteria and insurance authorization.   Follow Up Recommendations  Acute inpatient rehab (3hours/day)     Assistance Recommended at Discharge Frequent or constant Supervision/Assistance  Patient can return home with the following Two people to help with walking and/or transfers;Two people to help with bathing/dressing/bathroom    Functional Status Assessment  Patient has had a recent decline in their functional status and demonstrates the ability to make significant improvements in function in a reasonable and predictable amount of time.  Equipment Recommendations  Wheelchair (measurements OT);Wheelchair cushion (measurements OT);Hospital bed;Other (comment) (lift)     Recommendations for Other Services Rehab consult     Precautions / Restrictions Precautions Precautions: Fall;Other (comment) Precaution Comments: left hemiparesis, cortrak, L wrist restraint      Mobility Bed Mobility Overal bed mobility: Needs Assistance Bed Mobility: Rolling Rolling: +2 for physical assistance, Max assist         General bed mobility comments: pt rolling R and L for peri care and assist RN with new rectal pouch. pt without response to removal or hygiene    Transfers                   General transfer comment: not appropraite at this time      Balance                                           ADL either performed or assessed with clinical judgement   ADL Overall ADL's : Needs assistance/impaired                                       General ADL Comments: total (A) for all care at this time     Vision Baseline Vision/History: 1 Wears glasses Additional Comments: difficult to fully assess. pt does not sustain visual attention. pt does lift head to name call.     Perception     Praxis      Pertinent Vitals/Pain Pain Assessment Pain Assessment: No/denies pain Faces Pain Scale: No hurt     Hand Dominance Right   Extremity/Trunk Assessment Upper Extremity Assessment Upper Extremity Assessment: RUE  deficits/detail;LUE deficits/detail RUE Deficits / Details: following 1 step command. squeeze on command LUE Deficits / Details: moved away from noxious stimuli with supination and elbow flexion LUE Sensation: decreased light touch LUE Coordination: decreased fine motor;decreased gross motor   Lower Extremity Assessment Lower Extremity Assessment: Defer to PT evaluation RLE Deficits / Details: AAROM WFL; hip flexion 3, extension 3+, knee extension 3+, RLE Coordination: WNL LLE Deficits / Details: PROM WFL; moves 1st toe to painful stimulus at 2nd toe, no other active movement noted   Cervical /  Trunk Assessment Cervical / Trunk Assessment: Other exceptions Cervical / Trunk Exceptions: obese   Communication Communication Communication: Expressive difficulties (recently extubated with weak voice; only verbalization "bye" on command)   Cognition Arousal/Alertness: Lethargic Behavior During Therapy: Flat affect Overall Cognitive Status: Difficult to assess                                 General Comments: following one step commands (as able);     General Comments  VSS bed level reevaluation    Exercises Exercises: Other exercises Other Exercises Other Exercises: PROM L UE ( hand wrist elbow)   Shoulder Instructions      Home Living Family/patient expects to be discharged to:: Private residence Living Arrangements: Alone Available Help at Discharge: Family;Friend(s);Available PRN/intermittently Type of Home: House Home Access: Stairs to enter Entrance Stairs-Number of Steps: 1   Home Layout: Two level;1/2 bath on main level;Bed/bath upstairs Alternate Level Stairs-Number of Steps: flight Alternate Level Stairs-Rails: Right Bathroom Shower/Tub: Teacher, early years/pre: Standard Bathroom Accessibility: Yes   Home Equipment: Hand held shower head   Additional Comments: in the process of getting a handicapped accessible apartment      Prior Functioning/Environment Prior Level of Function : Independent/Modified Independent;Driving;Working/employed             Mobility Comments: no AD, works as a Research officer, 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 Her boyfriend has been driving her to/from work. ADLs Comments: mod I        OT Problem List: Decreased strength;Decreased range of motion;Decreased activity tolerance;Impaired balance (sitting and/or standing);Decreased safety awareness;Decreased knowledge of precautions;Decreased knowledge of use of DME or AE;Decreased coordination;Decreased cognition;Impaired vision/perception;Impaired  sensation;Impaired tone;Obesity;Impaired UE functional use;Cardiopulmonary status limiting activity      OT Treatment/Interventions: Self-care/ADL training;Therapeutic exercise;Neuromuscular education;Energy conservation;DME and/or AE instruction;Manual therapy;Modalities;Splinting;Therapeutic activities;Cognitive remediation/compensation;Visual/perceptual remediation/compensation;Patient/family education;Balance training    OT Goals(Current goals can be found in the care plan section) Acute Rehab OT Goals Patient Stated Goal: none stated. daughter reports pt expressed to her to take the feeding tube out OT Goal Formulation: With patient Time For Goal Achievement: 10/11/22 Potential to Achieve Goals: Good  OT Frequency: Min 2X/week    Co-evaluation PT/OT/SLP Co-Evaluation/Treatment: Yes Reason for Co-Treatment: Complexity of the patient's impairments (multi-system involvement);To address functional/ADL transfers;For patient/therapist safety;Necessary to address cognition/behavior during functional activity PT goals addressed during session: Mobility/safety with mobility OT goals addressed during session: ADL's and self-care;Proper use of Adaptive equipment and DME;Strengthening/ROM      AM-PAC OT "6 Clicks" Daily Activity     Outcome Measure Help from another person eating meals?: Total Help from another person taking care of personal grooming?: Total Help from another person toileting, which includes using toliet, bedpan, or urinal?: Total Help from another person bathing (including washing, rinsing, drying)?: Total Help from another person to put on and taking off regular upper body clothing?: Total Help from  another person to put on and taking off regular lower body clothing?: Total 6 Click Score: 6   End of Session Equipment Utilized During Treatment: Oxygen Nurse Communication: Mobility status;Need for lift equipment;Precautions  Activity Tolerance: Patient tolerated treatment  well Patient left: in bed;with call bell/phone within reach;with bed alarm set;with family/visitor present;with SCD's reapplied;with restraints reapplied  OT Visit Diagnosis: Unsteadiness on feet (R26.81);Muscle weakness (generalized) (M62.81);Hemiplegia and hemiparesis Hemiplegia - Right/Left: Left Hemiplegia - dominant/non-dominant: Non-Dominant Hemiplegia - caused by: Cerebral infarction                Time: 1420-1449 OT Time Calculation (min): 29 min Charges:  OT General Charges $OT Visit: 1 Visit OT Evaluation $OT Eval Moderate Complexity: 1 Mod   Brynn, OTR/L  Acute Rehabilitation Services Office: [phone] .   Jeri Modena 09/27/2022, 3:26 PM

## 2022-09-27 NOTE — Progress Notes (Signed)
Inpatient Rehab Admissions Coordinator Note:   Per therapy recommendations patient was screened for CIR candidacy by Michel Santee, PT. At this time, pt appears to be a potential candidate for CIR. I will place an order for rehab consult for full assessment, per our protocol.  Please contact me any with questions.Shann Medal, PT, DPT [phone] 09/27/22 4:14 PM

## 2022-09-27 NOTE — Progress Notes (Signed)
NAME:  [name], MRN:  [id number], DOB:  12-26-1975, LOS: 4 ADMISSION DATE:  09/22/2022, CONSULTATION DATE:  09/24/2022 REFERRING MD:  Dr. Allegra Grana, CHIEF COMPLAINT:  Vent management   History of Present Illness:  [name] is a 47 y.o. with an extensive PMH that includes but not limited to Acute combined systolic and diastolic congestive heart failure, history of LV thrombus, prior NSTEMMI, prior PE, A-fib, type 2 diabetes, HTN, depression, and OSA who presented 3/1 with 2 week history of lower extremity edema, shortness of breath, and chest pain. Patient was admitted per FMTS for management of acute CHF exacerbation and possible NSTEMI  Afternoon of 3/2 patient suffered acute change in mental status resulting in code stroke being called.  This is in the setting of subtherapeutic INR of 1.3 despite anticoagulation with Coumadin on admission with history of chronic LV thrombus and paroxysmal atrial fibrillation.  CTA head revealed likely evolving stroke with proximal right M2 occlusion with minimal collateral flow throughout the MCA distribution.  Neurology and interventional neurology consulted and patient was taken urgently for thrombectomy.  Postoperatively patient remained on ventilator prompting PCCM consult.  Pertinent  Medical History  Includes but not limited to: Acute combined systolic and diastolic congestive heart failure, history of LV thrombus, prior NSTEMI, prior PE, A-fib, type 2 diabetes, HTN, depression, and OSA  Significant Hospital Events: Including procedures, antibiotic start and stop dates in addition to other pertinent events   3/2 Sedated on vent   Interim History / Subjective:   No acute events overnight. Remains on heparin infusion. Propofol overnight stopped this morning for WUA. TF ongoing. Afebrile.   Objective   Blood pressure 115/89, pulse 78, temperature (!) 97.1 F (36.2 C), temperature source Axillary, resp. rate 11, height 4' 11.02" (1.499 m),  weight 89.9 kg, last menstrual period 06/22/2022, SpO2 98 %.    Vent Mode: PRVC FiO2 (%):  [40 %] 40 % Set Rate:  [18 bmp] 18 bmp Vt Set:  [340 mL] 340 mL PEEP:  [5 cmH20] 5 cmH20 Pressure Support:  [8 cmH20] 8 cmH20 Plateau Pressure:  [12 cmH20-14 cmH20] 14 cmH20   Intake/Output Summary (Last 24 hours) at 09/27/2022 0802 Last data filed at 09/27/2022 0700 Gross per 24 hour  Intake 646.65 ml  Output 425 ml  Net 221.65 ml    Filed Weights   09/25/22 1615 09/26/22 0419 09/27/22 0500  Weight: 92.6 kg 91.7 kg 89.9 kg    Examination:  General: Middle aged female on ventilator HEENT: /AT, PERRL, no JVD Neuro: Eyes open to voice. Follows commands weakly on the right. + cough/gag.  CV: RRR, no MRG PULM:  Clear, unlabored. Tolerating SBT, with mildly elevated RR.  GI: Soft, NT. ND Extremities: No acute deformity. No edema.  Skin: Grossly intact.   Repeat CT head does not demonstrate hemorrhagic conversion as was the concern. Echo 3/3 with LVEF 15% and large LV thrombus seen. RV with mod reduced systolic function as well.   Resolved Hospital Problem list     Assessment & Plan:   Postoperative ventilator management  -Remained intubated post thrombectomy  P: Continue ventilator support with lung protective strategies  Wean PEEP and FiO2 for sats greater than 90%. SAT/SBT as tolerated VAP bundle in place  PAD protocol with propofol and PRN fentanyl. RASS goal 0 to -1.  Not yet ready for extubation from a respiratory effort or airway protection standpoint.   Acute CVA  -Acute neuro change seen in hospital with code  stroke called.  CTA head revealed likely evolving stroke with proximal right M2 occlusion with minimal collateral flow throughout the MCA distribution.  Neurology and interventional neurology consulted and patient was taken urgently for thrombectom P: Primary management per neurology  Maintain neuro protective measures; goal for eurothermia, euglycemia, eunatermia,  normoxia, and PCO2 goal of 35-40 Seizure precautions  Aspirations precautions   In hospital cardiac arrest -On induction for thrombectomy patient lost blood pressure follow by lose of pulses, 33mns of ACLS protocol provided prior to ROSC P: Continuous telemetry Continue pressors for MAP goal >65  Acute on chronic combined systolic and diastolic congestive heart failure  Nonischemic cardiomyopathy  -Most recent ECHO August 2023 with ED < 20% and grade III diastolic dysfunction  Elevated Troponin  P: Primary management per cardiology Continuous telemetry Strict intake and output Daily weight to assess volume status Metoprolol and spironolactone resumed.  Hold lasix, Losartan,  and Jardance Closely monitor renal function and electrolytes Vent support as above Optimize electrolytes  Chronic LV thrombus  Subtherapeutic INR on Coumadin -ECHO August 2023 with 2.2 x 1.4LV thrombus at the apex with some mobility -INR on admit 1.3 P: Heparin infusion per pharmacy  Paroxysmal Atrial Fibrillation  P: Anticoagulation as above  Continuous telemetry  Type 2 diabetes A1C 10.8 on admit  P: SSI  CBG goal 140-180  Hypokalemia P: Trend Bmet Supplement as needed   Nutrition - TF advance as tolerated.   Best Practice (right click and "Reselect all SmartList Selections" daily)   Diet/type: NPO DVT prophylaxis: SCD GI prophylaxis: PPI Lines: Central line Foley:  Yes, and it is still needed Code Status:  full code Last date of multidisciplinary goals of care discussion: Pending   Labs   CBC: Recent Labs  Lab 09/22/22 2050 09/23/22 0514 09/24/22 0130 09/25/22 0508 09/26/22 0413 09/27/22 0459  WBC 8.7 7.5 6.7 7.9 10.3 12.4*  NEUTROABS 6.0  --   --   --   --   --   HGB 13.0 12.4 12.3 12.5 12.1 11.9*  HCT 41.3 39.6 39.4 38.5 37.1 36.0  MCV 71.1* 71.4* 71.0* 70.8* 70.1* 70.2*  PLT 232 228 213 234 233 236     Basic Metabolic Panel: Recent Labs  Lab 09/24/22 0130  09/24/22 2231 09/25/22 0508 09/26/22 0413 09/26/22 1458 09/27/22 0459  NA 133* 135 136 136  --  135  K 3.3* 4.1 4.6 4.8  --  4.1  CL 96* 96* 96* 98  --  100  CO2 '28 27 26 24  '$ --  26  GLUCOSE 98 151* 140* 141*  --  138*  BUN '12 12 13 16  '$ --  21*  CREATININE 1.04* 1.08* 1.14* 1.18*  --  0.92  CALCIUM 8.4* 8.6* 8.9 8.6*  --  8.3*  MG 1.6* 1.9 2.4 2.5* 2.4 2.3  PHOS  --   --  5.4*  --  5.2* 4.3    GFR: Estimated Creatinine Clearance: 76.3 mL/min (by C-G formula based on SCr of 0.92 mg/dL). Recent Labs  Lab 09/24/22 0130 09/25/22 0508 09/26/22 0413 09/27/22 0459  WBC 6.7 7.9 10.3 12.4*     Liver Function Tests: No results for input(s): "AST", "ALT", "ALKPHOS", "BILITOT", "PROT", "ALBUMIN" in the last 168 hours. No results for input(s): "LIPASE", "AMYLASE" in the last 168 hours. No results for input(s): "AMMONIA" in the last 168 hours.  ABG    Component Value Date/Time   PHART 7.451 (H) 09/17/2021 2204   PCO2ART 31.6 (L) 09/17/2021 2204  PO2ART 114 (H) 09/17/2021 2204   HCO3 28.1 (H) 09/22/2022 2128   TCO2 33 (H) 09/20/2021 0840   TCO2 32 09/20/2021 0840   ACIDBASEDEF 0.8 10/18/2021 0750   O2SAT 80.3 09/22/2022 2128     Coagulation Profile: Recent Labs  Lab 09/22/22 2134  INR 1.3*     Cardiac Enzymes: No results for input(s): "CKTOTAL", "CKMB", "CKMBINDEX", "TROPONINI" in the last 168 hours.  HbA1C: HbA1c, POC (controlled diabetic range)  Date/Time Value Ref Range Status  08/31/2021 11:20 AM 13.2 (A) 0.0 - 7.0 % Final  12/11/2019 10:39 AM 9.3 (A) 0.0 - 7.0 % Final   Hgb A1c MFr Bld  Date/Time Value Ref Range Status  09/24/2022 01:30 AM 10.8 (H) 4.8 - 5.6 % Final    Comment:    (NOTE)         Prediabetes: 5.7 - 6.4         Diabetes: >6.4         Glycemic control for adults with diabetes: <7.0   09/23/2022 12:18 PM 10.8 (H) 4.8 - 5.6 % Final    Comment:    (NOTE)         Prediabetes: 5.7 - 6.4         Diabetes: >6.4         Glycemic control for  adults with diabetes: <7.0     CBG: Recent Labs  Lab 09/26/22 1555 09/26/22 1931 09/26/22 2327 09/27/22 0331 09/27/22 0749  GLUCAP 142* 137* 129* 125* 125*       Critical care time:  42 minutes    Georgann Housekeeper, AGACNP-BC [facility]  See Amion for personal pager PCCM on call pager [phone] until 7pm. Please call Elink 7p-7a. [phone]  09/27/2022 8:04 AM

## 2022-09-27 NOTE — Progress Notes (Addendum)
STROKE TEAM PROGRESS NOTE   INTERVAL HISTORY Her RN is at the bedside. Pt still lethargic, open eyes on voice. Followed some simple commands on the right hand and foot but slow in action. CT yesterday stable large right MCA infarct no frank hematoma. Still on heparin IV, plan to try extubate tomorrow.    Vitals:   09/27/22 0600 09/27/22 0700 09/27/22 0800 09/27/22 0805  BP: (!) 120/95 115/89 (!) 122/95   Pulse: 80 78 80   Resp: (!) 21 11 (!) 0   Temp:   98 F (36.7 C)   TempSrc:   Oral   SpO2: 98% 98% 98% 100%  Weight:      Height:       CBC:  Recent Labs  Lab 09/22/22 2050 09/23/22 0514 09/26/22 0413 09/27/22 0459  WBC 8.7   < > 10.3 12.4*  NEUTROABS 6.0  --   --   --   HGB 13.0   < > 12.1 11.9*  HCT 41.3   < > 37.1 36.0  MCV 71.1*   < > 70.1* 70.2*  PLT 232   < > 233 236   < > = values in this interval not displayed.   Basic Metabolic Panel:  Recent Labs  Lab 09/26/22 0413 09/26/22 1458 09/27/22 0459  NA 136  --  135  K 4.8  --  4.1  CL 98  --  100  CO2 24  --  26  GLUCOSE 141*  --  138*  BUN 16  --  21*  CREATININE 1.18*  --  0.92  CALCIUM 8.6*  --  8.3*  MG 2.5* 2.4 2.3  PHOS  --  5.2* 4.3   Lipid Panel:  Recent Labs  Lab 09/24/22 0130 09/25/22 0508  CHOL 136  --   TRIG 62 116  HDL 27*  --   CHOLHDL 5.0  --   VLDL 12  --   LDLCALC 97  --    HgbA1c:  Recent Labs  Lab 09/24/22 0130  HGBA1C 10.8*   Urine Drug Screen: No results for input(s): "LABOPIA", "COCAINSCRNUR", "LABBENZ", "AMPHETMU", "THCU", "LABBARB" in the last 168 hours.  Alcohol Level No results for input(s): "ETH" in the last 168 hours.  IMAGING past 24 hours DG Abd Portable 1V  Result Date: 09/26/2022 CLINICAL DATA:  Encounter for feeding tube placement EXAM: PORTABLE ABDOMEN - 1 VIEW COMPARISON:  09/24/2022 FINDINGS: Interval exchange of NG tube for feeding tube. Feeding tube within the distal stomach with tip directed towards the pylorus. IMPRESSION: Feeding tube with tip in  distal stomach.  Stylet removed. Electronically Signed   By: Suzy Bouchard M.D.   On: 09/26/2022 14:54   CT HEAD WO CONTRAST (5MM)  Result Date: 09/26/2022 CLINICAL DATA:  Neuro deficit, acute, stroke suspected. EXAM: CT HEAD WITHOUT CONTRAST TECHNIQUE: Contiguous axial images were obtained from the base of the skull through the vertex without intravenous contrast. RADIATION DOSE REDUCTION: This exam was performed according to the departmental dose-optimization program which includes automated exposure control, adjustment of the mA and/or kV according to patient size and/or use of iterative reconstruction technique. COMPARISON:  CTA head/neck 09/24/2022.  MRI brain 09/25/2022. FINDINGS: Brain: Redemonstration right superior division MCA territory and distal right ACA territory infarcts. Few areas of hypoattenuation in the right posterior insula and corona radiata, consistent with petechial hemorrhage. No significant mass effect or midline shift. No hydrocephalus or extra-axial collection. Vascular: Unremarkable. Skull: No calvarial fracture or suspicious bone lesion. Skull base  is unremarkable. Sinuses/Orbits: Unremarkable. Other: None. 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 No significant mass effect or midline shift. Electronically Signed   By: Emmit Alexanders M.D.   On: 09/26/2022 12:14    PHYSICAL EXAM  Temp:  [97.1 F (36.2 C)-98.4 F (36.9 C)] 98 F (36.7 C) (03/05 0800) Pulse Rate:  [74-94] 80 (03/05 0800) Resp:  [0-25] 0 (03/05 0800) BP: (100-156)/(77-105) 122/95 (03/05 0800) SpO2:  [96 %-100 %] 100 % (03/05 0805) Arterial Line BP: (110-167)/(69-105) 129/82 (03/05 0800) FiO2 (%):  [40 %] 40 % (03/05 0805) Weight:  [89.9 kg] 89.9 kg (03/05 0500)   General - Well nourished, well developed, intubated off sedation.   Ophthalmologic - fundi not visualized due to noncooperation.    Cardiovascular - Regular rate and rhythm, not in afib.   Neuro - intubated off sedation, eyes open on voice, following most peripheral commands on the right and limited midline commands today. With eye opening, eyes in mid position, blinking to visual threat on the right but not on the left. Not actively tracking, PERRL. Corneal reflex present, gag and cough present. Breathing over the vent.  Facial symmetry not able to test due to ET tube. Tongue protrusion not cooperative. Spontaneous movement of RUE against gravity, LUE flaccid, BLEs slight withdraw to pain. Sensation, coordination and gait not tested.     ASSESSMENT/PLAN [name] is a 47 y.o. female with extensive PMH that includes but not limited to Acute combined systolic and diastolic congestive heart failure, history of LV thrombus, prior NSTEMMI, prior PE, A-fib, type 2 diabetes, HTN, depression, and OSA who presented 3/1 with 2 week history of lower extremity edema, shortness of breath, and chest pain.  Neurology was consulted for code stroke 3/2 due to left-sided weakness.  Patient underwent IR procedure due to right M2 occlusion she remained intubated postprocedure.  Stroke workup as detailed below.  3/3 MRI negative for hemorrhage, will restart heparin drip.  Will plan for SBT and hopeful extubation early tomorrow a.m.  Appreciate CCM assistance with this.  Stroke - right MCA infarct with right M2 occlusion, s/p IR with TICI2b, etiology likely embolic from known LV thrombus, cardiomyopathy with low EF and PAF even on heparin IV Code Stroke CT head: Loss of gray-white differentiation in the insula likely reflecting early infarct.  Aspects is 9 CTA head & neck:  Proximal right M2 occlusion with minimal collateral flow throughout the MCA distribution. IR - Occluded inferior division of the right middle cerebral artery in the mid M2 segment. TICI 2B revascularization.  Post IR CT: no evidence of intracranial hemorrhage.  MRI: Large  area of acute infarct in the right MCA distribution, smaller area of acute infarct in the right ACA distribution, and punctate acute infarct in the left cerebellar hemisphere. No evidence of hemorrhagic transformation or midline shift. MRA head - Persistent occlusion of a right M2 branch with minimal collateral flow in the MCA distribution aside from in the region of the frontal operculum. CT repeat 3/4 stable right superior division MCA territory and distal right ACA territory infarcts with petechial hemorrhage. 2D Echo: Large LV apical thrombus present, LVEF 15% LDL 97 HgbA1c 10.8 VTE prophylaxis - SCDs, Heparin IV  warfarin daily prior to admission (?compliance), now on heparin IV.  Therapy recommendations:  pending Disposition:  pending  Brief Cardiac Arrest during IR procedure 10mns of ACLS before ROSC achieved Cardiology/CCM on board  Acute Respiratory  Failure Remains intubated Now on weaning Appreciate CCM assistance Not extubation candidate today, will plan for tomorrow if able  Acute on chronic combined congestive heart failure Nonischemic cardiomyopathy 2D Echo: Large LV apical thrombus present, LVEF 15% Subtherapeutic INR on admission Resumed Heparin gtt Cardiology on board R/L cath 08/2021 showed mild, nonobstructive CAD CXR with cardiomegaly and mild pulmonary vascular congestion. Strict Intake/Output, Foley catheter, daily weights Resume Toprol and spironolactone 3/4 Hold Losartan for now, once BP further stable, will add back  Paroxysmal Atrial Fibrillation Resumed Heparin gtt  Continuous telemetry monitoring  Hx of hypertension hypotension Stable on the low end Off Neo Resumed Toprol and spironolactone, hold off cozaar  BP goal 120-180 Long-term BP goal normotensive  Hyperlipidemia Home meds:  none LDL 97, goal < 70 on lipitor '80mg'$   Continue statin at discharge  Diabetes type II Uncontrolled Home meds:  novolog flexpen, and Jardiance HgbA1c 10.8,  goal < 7.0 CBG  SSI Hold off jardiance until able to have po Close PCP follow up for better DM control  Dysphagia  NPO now Consider TF today after CT Consider cortrak if planning for extubation  Other Stroke Risk Factors Obesity, Body mass index is 40.01 kg/m., BMI >/= 30 associated with increased stroke risk, recommend weight loss, diet and exercise as appropriate  Coronary artery disease / NSTEMI Migraines Obstructive sleep apnea  Other Active Problems Hypokalemia, resolved. Goal >4 Hypomagnesia, resolved. [place] >2.  AKI Cre 1.04->1.14->1.18->0.92  Hospital day # 4  This patient is critically ill due to right MCA stroke, status post mechanical thrombectomy, LV thrombus, A-fib, cardiomyopathy, respiratory failure and at significant risk of neurological worsening, death form recurrent stroke, hemorrhagic transformation, heart failure, arrhythmia, seizure. This patient's care requires constant monitoring of vital signs, hemodynamics, respiratory and cardiac monitoring, review of multiple databases, neurological assessment, discussion with family, other specialists and medical decision making of high complexity. I spent 35 minutes of neurocritical care time in the care of this patient. I also discussed with Dr. Lamonte Sakai CCM.   Rosalin Hawking, MD PhD Stroke Neurology 09/27/2022 10:43 AM     To contact Stroke Continuity provider, please refer to [email or web address] After hours, contact General Neurology

## 2022-09-27 NOTE — Progress Notes (Signed)
RT called to bedside due to pt self extubating. When RT arrived pt was resting comfortably on 2L [place]. SpO2 98%, vitals stable and CCM MD at bedside. Pt protecting airway at this time, no stridor noted. RT will monitor.

## 2022-09-27 NOTE — Progress Notes (Signed)
Orthopedic Tech Progress Note Patient Details:  [name] 07-08-1976 [id number]  Ortho Devices Type of Ortho Device: Prafo boot/shoe Ortho Device/Splint Location: left Ortho Device/Splint Interventions: Ordered, Application, Adjustment  Order called into [facility] for left resting hand splint.   Post Interventions Patient Tolerated: Well Instructions Provided: Care of device  Lateefah Mallery OTR/L 09/27/2022, 4:23 PM

## 2022-09-27 NOTE — Progress Notes (Signed)
ANTICOAGULATION CONSULT NOTE -   Pharmacy Consult for Heparin Indication:  LV Thrombus  Allergies  Allergen Reactions   Fish Allergy Swelling   Shellfish Allergy Anaphylaxis, Swelling and Other (See Comments)    Airway involvement including EMS - Has Epi-Pen   Metformin And Related Diarrhea   Trulicity [Dulaglutide] Nausea And Vomiting and Other (See Comments)    Multiple episodes of vomiting over multiple days   Nsaids Other (See Comments)    Per PCP interferes with daily meds (heart failure)   Advil [Ibuprofen] Other (See Comments)    Per PCP interferes with daily meds (heart failure)    Patient Measurements: Height: 4' 11.02" (149.9 cm) Weight: 89.9 kg (198 lb 3.1 oz) IBW/kg (Calculated) : 43.24 Heparin Dosing Weight: 65.6 kg  Vital Signs: Temp: 97.1 F (36.2 C) (03/05 0400) Temp Source: Axillary (03/05 0400) BP: 115/89 (03/05 0700) Pulse Rate: 78 (03/05 0700)  Labs: Recent Labs    09/25/22 0508 09/25/22 2258 09/26/22 0413 09/26/22 0638 09/26/22 1456 09/26/22 2155 09/27/22 0459  HGB 12.5  --  12.1  --   --   --  11.9*  HCT 38.5  --  37.1  --   --   --  36.0  PLT 234  --  233  --   --   --  236  HEPARINUNFRC  --    < >  --    < > 0.43 0.40 0.42  CREATININE 1.14*  --  1.18*  --   --   --  0.92   < > = values in this interval not displayed.     Estimated Creatinine Clearance: 76.3 mL/min (by C-G formula based on SCr of 0.92 mg/dL).   Medical History: Past Medical History:  Diagnosis Date   Abnormal Pap smear    Acute combined systolic and diastolic heart failure ([facility]) 02/19/2019   Wt Readings from Last 3 Encounters:  09/18/21  84.7 kg  09/02/21  84.4 kg  08/31/21  84.2 kg         Acute heart failure ([facility]) 01/29/2022   Acute on chronic combined systolic and diastolic CHF (congestive heart failure) ([facility]) 02/19/2019   Acute on chronic congestive heart failure ([facility])    Acute on chronic congestive heart failure ([facility])    Acute on chronic diastolic CHF  (congestive heart failure) ([facility]) 10/19/2021   Acute on chronic systolic heart failure ([facility]) 02/22/2022   Anemia    Asthma    Atrial fibrillation ([facility])    history of paroxysmal atrial fibrillation   CHF (congestive heart failure) ([facility]) 11/02/2021   CHF exacerbation ([facility]) 10/17/2021   Diabetes mellitus    DKA, type 2 ([facility]) 02/06/2019   Elevated troponin 09/30/2019   Heart failure with reduced ejection fraction ([facility])    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     Migraines    Morbid obesity ([facility]) 02/06/2019   Nausea & vomiting 06/26/2022   Nonischemic cardiomyopathy ([facility]) 09/22/2021   NSTEMI (non-ST elevated myocardial infarction) ([facility]) 02/23/2022   Obstructive sleep apnea 06/30/2015   Polycystic ovarian disease     Assessment:  47 y.o. 2  week history of lower extremity edema, shortness of breath, and chest pain. Patient developed stroke as inpatient. Now found to have an LV Thrombus.  Pharmacy consulted to start heparin infusion for LV thrombus using the stroke protocol (no bolus).  Patient on warfarin PTA.  HL 0.42 on 950 units/h - therapeutic. CBC stable   Goal of Therapy:  Heparin level 0.3-0.5 units/ml Monitor platelets by anticoagulation protocol: Yes   Plan:  Continue heparin infusion at 950 units/hr Check anti-Xa level daily while on heparin Continue to monitor H&H and platelets  Wilson Singer, PharmD Clinical Pharmacist 09/27/2022 7:04 AM

## 2022-09-27 NOTE — Progress Notes (Signed)
SLP Cancellation Note  Patient Details Name: MARGARETTE ANIS MRN: [id number] DOB: 10/19/1975   Cancelled treatment:       Reason Eval/Treat Not Completed: Patient not medically ready   Rey Dansby, Katherene Ponto 09/27/2022, 7:51 AM

## 2022-09-27 NOTE — Evaluation (Signed)
Physical Therapy Re-Evaluation Patient Details Name: [name] MRN: [id number] DOB: November 19, 1975 Today's Date: 09/27/2022  History of Present Illness  47 y.o. female admitted 2/29 for SOB related to acute on chronic CHF. 3/2 R MCA CVA with small L cerebellar CVA, NIHSS=16, to IR for thrombectomy but was unsuccessful, cardiac arrest during procedure with <1 minute of chest compressions, remained intubated post-procedure and self-extubated 3/5; PMH: CAD, DM2, HTN, a fib, asthma, anxiety, depression  Clinical Impression   Pt re-evaluated due to large Rt MCA CVA and small L cerebellar CVA since evaluation completed. PTA patient was independent with mobility. Pt currently requires mod to +2 total assist for bed level activities. + limited movement noted in both LUE and LLE and following commands consistently with rt side. Anticipate progression to level that can benefit from and tolerate AIR. Anticipate patient will benefit from PT to address problems listed below.Will continue to follow acutely to maximize functional mobility independence and safety.           Recommendations for follow up therapy are one component of a multi-disciplinary discharge planning process, led by the attending physician.  Recommendations may be updated based on patient status, additional functional criteria and insurance authorization.  Follow Up Recommendations Acute inpatient rehab (3hours/day)      Assistance Recommended at Discharge Frequent or constant Supervision/Assistance  Patient can return home with the following  Other (comment) (cannot currently be cared for at home)    Equipment Recommendations Wheelchair (measurements PT);Wheelchair cushion (measurements PT);Hospital bed  Recommendations for Other Services  Rehab consult    Functional Status Assessment Patient has had a recent decline in their functional status and demonstrates the ability to make significant improvements in function in a reasonable  and predictable amount of time.     Precautions / Restrictions Precautions Precautions: Fall;Other (comment) Precaution Comments: left hemiparesis      Mobility  Bed Mobility Overal bed mobility: Needs Assistance Bed Mobility: Rolling Rolling: Total assist, +2 for physical assistance         General bed mobility comments: rolling rt +2 total, left max assist; rolled from her left side to her back mod assist; scooted up to [facility] with +2 total assist; EOB deferred as pt required incr time for cleaning due to rectal pouch leaking    Transfers                        Ambulation/Gait                  Stairs            Wheelchair Mobility    Modified Rankin (Stroke Patients Only) Modified Rankin (Stroke Patients Only) Pre-Morbid Rankin Score: No symptoms Modified Rankin: Severe disability     Balance Overall balance assessment:  (TBA; assumed impaired due to L hemiparesis)                                           Pertinent Vitals/Pain Pain Assessment Pain Assessment: Faces Faces Pain Scale: No hurt    Home Living Family/patient expects to be discharged to:: Private residence Living Arrangements: Alone Available Help at Discharge: Family;Friend(s);Available PRN/intermittently Type of Home: House Home Access: Stairs to enter   Entrance Stairs-Number of Steps: 1 Alternate Level Stairs-Number of Steps: flight Home Layout: Two level;1/2 bath on main level;Bed/bath upstairs Home Equipment: Hand  held shower head Additional Comments: in the process of getting a handicapped accessible apartment    Prior Function Prior Level of Function : Independent/Modified Independent;Driving;Working/employed             Mobility Comments: no AD, works as a Research officer, 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 Her boyfriend has been driving her to/from work. ADLs Comments: mod I     Hand Dominance   Dominant Hand: Right    Extremity/Trunk  Assessment   Upper Extremity Assessment Upper Extremity Assessment: Defer to OT evaluation    Lower Extremity Assessment Lower Extremity Assessment: RLE deficits/detail;LLE deficits/detail RLE Deficits / Details: AAROM WFL; hip flexion 3, extension 3+, knee extension 3+, RLE Coordination: WNL LLE Deficits / Details: PROM WFL; moves 1st toe to painful stimulus    Cervical / Trunk Assessment Cervical / Trunk Assessment: Other exceptions Cervical / Trunk Exceptions: obese  Communication   Communication: Expressive difficulties (recently extubated with weak voice; only verbalization "bye" on command)  Cognition Arousal/Alertness: Lethargic Behavior During Therapy: Flat affect Overall Cognitive Status: Difficult to assess                                 General Comments: following one step commands (as able);        General Comments General comments (skin integrity, edema, etc.): Eyes open throughout session; rolling to left noted rt beating nystagmus that lasted >1 minute    Exercises     Assessment/Plan    PT Assessment Patient needs continued PT services  PT Problem List Decreased strength;Decreased balance;Pain;Decreased mobility;Decreased activity tolerance;Decreased knowledge of use of DME;Impaired sensation;Impaired tone;Obesity       PT Treatment Interventions Gait training;Therapeutic activities;Functional mobility training;Balance training;Patient/family education;DME instruction;Neuromuscular re-education;Wheelchair mobility training    PT Goals (Current goals can be found in the Care Plan section)  Acute Rehab PT Goals Patient Stated Goal: unable to state PT Goal Formulation: Patient unable to participate in goal setting Time For Goal Achievement: 10/11/22 Potential to Achieve Goals: Good    Frequency Min 4X/week     Co-evaluation PT/OT/SLP Co-Evaluation/Treatment: Yes Reason for Co-Treatment: Complexity of the patient's impairments  (multi-system involvement);To address functional/ADL transfers;For patient/therapist safety;Necessary to address cognition/behavior during functional activity PT goals addressed during session: Mobility/safety with mobility OT goals addressed during session: ADL's and self-care;Proper use of Adaptive equipment and DME;Strengthening/ROM       AM-PAC PT "6 Clicks" Mobility  Outcome Measure Help needed turning from your back to your side while in a flat bed without using bedrails?: Total Help needed moving from lying on your back to sitting on the side of a flat bed without using bedrails?: Total Help needed moving to and from a bed to a chair (including a wheelchair)?: Total Help needed standing up from a chair using your arms (e.g., wheelchair or bedside chair)?: Total Help needed to walk in hospital room?: Total Help needed climbing 3-5 steps with a railing? : Total 6 Click Score: 6    End of Session   Activity Tolerance: Patient tolerated treatment well Patient left: in bed;with call bell/phone within reach;with bed alarm set;with family/visitor present;with restraints reapplied;with SCD's reapplied (Rt wrist restraint and mitt) Nurse Communication: Mobility status PT Visit Diagnosis: Hemiplegia and hemiparesis Hemiplegia - Right/Left: Left Hemiplegia - dominant/non-dominant: Non-dominant Hemiplegia - caused by: Cerebral infarction    Time: 1420-1449 PT Time Calculation (min) (ACUTE ONLY): 29 min   Charges:   PT Evaluation $PT  Re-evaluation: [place]  Office [phone]   Rexanne Mano 09/27/2022, 3:25 PM

## 2022-09-28 ENCOUNTER — Inpatient Hospital Stay (HOSPITAL_COMMUNITY): Payer: Medicare Other

## 2022-09-28 ENCOUNTER — Other Ambulatory Visit (HOSPITAL_COMMUNITY): Payer: Self-pay

## 2022-09-28 DIAGNOSIS — I513 Intracardiac thrombosis, not elsewhere classified: Secondary | ICD-10-CM | POA: Diagnosis not present

## 2022-09-28 DIAGNOSIS — J90 Pleural effusion, not elsewhere classified: Secondary | ICD-10-CM

## 2022-09-28 DIAGNOSIS — I63411 Cerebral infarction due to embolism of right middle cerebral artery: Secondary | ICD-10-CM | POA: Diagnosis not present

## 2022-09-28 DIAGNOSIS — J9601 Acute respiratory failure with hypoxia: Secondary | ICD-10-CM

## 2022-09-28 DIAGNOSIS — I5043 Acute on chronic combined systolic (congestive) and diastolic (congestive) heart failure: Secondary | ICD-10-CM | POA: Diagnosis not present

## 2022-09-28 DIAGNOSIS — I6601 Occlusion and stenosis of right middle cerebral artery: Secondary | ICD-10-CM | POA: Diagnosis not present

## 2022-09-28 LAB — URINALYSIS, COMPLETE (UACMP) WITH MICROSCOPIC
Bilirubin Urine: NEGATIVE
Glucose, UA: >=500 mg/dL — AB
Hgb urine dipstick: NEGATIVE
Ketones, ur: NEGATIVE mg/dL
Nitrite: NEGATIVE
Protein, ur: 100 mg/dL — AB
Specific Gravity, Urine: 1.024 (ref 1.005–1.030)
pH: 6 (ref 5.0–8.0)

## 2022-09-28 LAB — GLUCOSE, CAPILLARY
Glucose-Capillary: 189 mg/dL — ABNORMAL HIGH (ref 70–99)
Glucose-Capillary: 173 mg/dL — ABNORMAL HIGH (ref 70–99)
Glucose-Capillary: 202 mg/dL — ABNORMAL HIGH (ref 70–99)
Glucose-Capillary: 180 mg/dL — ABNORMAL HIGH (ref 70–99)
Glucose-Capillary: 189 mg/dL — ABNORMAL HIGH (ref 70–99)
Glucose-Capillary: 165 mg/dL — ABNORMAL HIGH (ref 70–99)

## 2022-09-28 LAB — CBC
HCT: 38.5 % (ref 36.0–46.0)
Hemoglobin: 12.1 g/dL (ref 12.0–15.0)
MCH: 22.7 pg — ABNORMAL LOW (ref 26.0–34.0)
MCHC: 31.4 g/dL (ref 30.0–36.0)
MCV: 72.1 fL — ABNORMAL LOW (ref 80.0–100.0)
Platelets: 238 10*3/uL (ref 150–400)
RBC: 5.34 MIL/uL — ABNORMAL HIGH (ref 3.87–5.11)
RDW: 23.9 % — ABNORMAL HIGH (ref 11.5–15.5)
WBC: 13.6 10*3/uL — ABNORMAL HIGH (ref 4.0–10.5)
nRBC: 0.1 % (ref 0.0–0.2)

## 2022-09-28 LAB — TRIGLYCERIDES: Triglycerides: 63 mg/dL (ref <150)

## 2022-09-28 LAB — BASIC METABOLIC PANEL
Anion gap: 9 (ref 5–15)
BUN: 27 mg/dL — ABNORMAL HIGH (ref 6–20)
CO2: 26 mmol/L (ref 22–32)
Calcium: 8.6 mg/dL — ABNORMAL LOW (ref 8.9–10.3)
Chloride: 100 mmol/L (ref 98–111)
Creatinine, Ser: 1.02 mg/dL — ABNORMAL HIGH (ref 0.44–1.00)
GFR, Estimated: >60 mL/min (ref >60)
Glucose, Bld: 184 mg/dL — ABNORMAL HIGH (ref 70–99)
Potassium: 4 mmol/L (ref 3.5–5.1)
Sodium: 135 mmol/L (ref 135–145)

## 2022-09-28 LAB — HEPARIN LEVEL (UNFRACTIONATED): Heparin Unfractionated: 0.34 [IU]/mL (ref 0.30–0.70)

## 2022-09-28 MED ORDER — OXYCODONE HCL 5 MG PO TABS
5.0000 mg | ORAL_TABLET | ORAL | Status: DC | PRN
  Administered 2022-09-28: 10 mg
  Administered 2022-09-28 – 2022-09-29 (×3): 5 mg
  Filled 2022-09-28 (×3): qty 1
  Filled 2022-09-28: qty 2

## 2022-09-28 MED ORDER — LOSARTAN POTASSIUM 50 MG PO TABS
50.0000 mg | ORAL_TABLET | Freq: Every day | ORAL | Status: DC
  Filled 2022-09-28: qty 1

## 2022-09-28 MED ORDER — LOSARTAN POTASSIUM 50 MG PO TABS: 50.0000 mg | ORAL_TABLET | Freq: Every day | ORAL | Status: DC

## 2022-09-28 MED ORDER — ADULT MULTIVITAMIN W/MINERALS CH
1.0000 | ORAL_TABLET | Freq: Every day | ORAL | Status: DC
  Administered 2022-09-28 – 2022-09-29 (×2): 1
  Filled 2022-09-28 (×2): qty 1

## 2022-09-28 MED ORDER — LOSARTAN POTASSIUM 50 MG PO TABS
50.0000 mg | ORAL_TABLET | Freq: Every day | ORAL | Status: DC
  Administered 2022-09-28 – 2022-09-29 (×2): 50 mg via NASOGASTRIC
  Filled 2022-09-28: qty 1

## 2022-09-28 MED ORDER — VITAL AF 1.2 CAL PO LIQD
1000.0000 mL | ORAL | Status: DC
  Administered 2022-09-28 – 2022-09-29 (×2): 1000 mL

## 2022-09-28 MED ORDER — METOPROLOL TARTRATE 25 MG PO TABS
25.0000 mg | ORAL_TABLET | Freq: Two times a day (BID) | ORAL | Status: DC
  Administered 2022-09-28 – 2022-09-29 (×3): 25 mg
  Filled 2022-09-28 (×3): qty 1

## 2022-09-28 MED ORDER — ONDANSETRON HCL 4 MG/2ML IJ SOLN
4.0000 mg | Freq: Four times a day (QID) | INTRAMUSCULAR | Status: DC | PRN
  Administered 2022-09-28 – 2022-09-30 (×4): 4 mg via INTRAVENOUS
  Filled 2022-09-28 (×5): qty 2

## 2022-09-28 NOTE — Progress Notes (Addendum)
Nutrition Follow-up  DOCUMENTATION CODES:   Morbid obesity  INTERVENTION:   Tube feeding via Cortrak: Vital AF 1.2 at 60 ml/h (1440 ml per day)  Provides 1728 kcal, 108 gm protein, 1167 ml free water daily   NUTRITION DIAGNOSIS:   Inadequate oral intake related to inability to eat, nausea, poor appetite as evidenced by NPO status, energy intake < 75% for > or equal to 3 months. Ongoing.   GOAL:   Patient will meet greater than or equal to 90% of their needs Met with TF at goal   MONITOR:   TF tolerance  REASON FOR ASSESSMENT:   Consult, Ventilator Enteral/tube feeding initiation and management  ASSESSMENT:   47 y.o. female admits related to SOB. PMH includes: CHF, afib, DM, NSTEMI. Pt is currently receiving medical management related to acute on chronic combined CHF.  Pt with R MCA with R M2 occlusion s/p IR, likely embolic per MD. Pt with L hemiplegia.  Noted brief arrest during IR procedure  Pt failed swallow eval today, remains NPO on Cortrak for TF. SLP following    Per previous RD notes pt with nausea PTA.   3/4 - s/p cortrak placement, tip distal stomach per xray  3/5  - self-extubated  Labs reviewed:  A1C: 10.8 CBG's: 167-202  Medications reviewed and include: colace, SSI, MVI with minerals, protonix, miralax, spironolactone    Diet Order:   Diet Order             Diet NPO time specified  Diet effective now                   EDUCATION NEEDS:   Not appropriate for education at this time  Skin:  Skin Assessment: Skin Integrity Issues: Skin Integrity Issues:: Incisions Incisions: right groin  Last BM:  3/5 large  Height:   Ht Readings from Last 1 Encounters:  09/25/22 4' 11.02" (1.499 m)    Weight:   Wt Readings from Last 1 Encounters:  09/27/22 89.9 kg    BMI:  Body mass index is 40.01 kg/m.  Estimated Nutritional Needs:   Kcal:  1100-1350 kcals  Protein:  85-115 gm  Fluid:  >/= 1.1 L  [name] P., RD, LDN,  CNSC See AMiON for contact information

## 2022-09-28 NOTE — Evaluation (Signed)
Speech Language Pathology Evaluation Patient Details Name: [name] MRN: [id number] DOB: [birth year] Today's Date: 09/28/2022 Time: [id number] SLP Time Calculation (min) (ACUTE ONLY): 16 min  Problem List:  Patient Active Problem List   Diagnosis Date Noted   Acute respiratory failure with hypoxia ([place]) 09/27/2022   Cerebrovascular accident (CVA) due to embolism of right middle cerebral artery ([place]) 09/27/2022   Middle cerebral artery embolism, right 09/24/2022   Acute on chronic combined systolic (congestive) and diastolic (congestive) heart failure ([place]) 09/23/2022   Anasarca 09/23/2022   CHF exacerbation ([place]) 08/05/2022   Nausea and vomiting 06/25/2022   Mild protein malnutrition ([place]) 06/25/2022   Class 2 obesity 06/25/2022   Hyperbilirubinemia 06/25/2022   Chronic pain 05/09/2022   Dizziness 11/02/2021   Depression, recurrent ([place])    Nonischemic cardiomyopathy ([place]) 09/22/2021   Nonobstructive atherosclerosis of coronary artery 09/22/2021   LV (left ventricular) mural thrombus 09/22/2021   Hypokalemia 10/13/2019   Elevated troponin 09/30/2019   Chronic chest wall pain 03/27/2019   Hyperglycemia    Biventricular failure ([place]) 02/06/2019   Morbid obesity ([place]) 02/06/2019   Chronic combined systolic and diastolic heart failure ([place]) 12/31/2018   Paroxysmal atrial fibrillation ([place]) 12/31/2018   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/31/2018   Chronic nausea 12/27/2018   Hyperlipidemia associated with type 2 diabetes mellitus ([employer]) 10/30/2017   Obstructive sleep apnea 06/30/2015   Shortness of breath 06/29/2015   Anemia of chronic disease 07/22/2013   Allergic rhinitis 06/27/2012   Hypertension associated with diabetes ([place]) 12/04/2011   Poorly controlled type 2 diabetes mellitus ([place]) 05/19/2011   PCOS (polycystic ovarian syndrome) 05/19/2011   Past Medical History:  Past Medical History:  Diagnosis Date   Abnormal Pap smear    Acute combined  systolic and diastolic heart failure ([place]) 02/19/2019   Wt Readings from Last 3 Encounters:  09/18/21  84.7 kg  09/02/21  84.4 kg  08/31/21  84.2 kg         Acute heart failure ([place]) 01/29/2022   Acute on chronic combined systolic and diastolic CHF (congestive heart failure) ([place]) 02/19/2019   Acute on chronic congestive heart failure (HCC)    Acute on chronic congestive heart failure (HCC)    Acute on chronic diastolic CHF (congestive heart failure) ([place]) 10/19/2021   Acute on chronic systolic heart failure ([place]) 02/22/2022   Anemia    Asthma    Atrial fibrillation ([place])    history of paroxysmal atrial fibrillation   CHF (congestive heart failure) ([place]) 11/02/2021   CHF exacerbation ([place]) 10/17/2021   Diabetes mellitus    DKA, type 2 ([place]) 02/06/2019   Elevated troponin 09/30/2019   Heart failure with reduced ejection fraction ([place])    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     Migraines    Morbid obesity ([place]) 02/06/2019   Nausea & vomiting 06/26/2022   Nonischemic cardiomyopathy ([place]) 09/22/2021   NSTEMI (non-ST elevated myocardial infarction) ([place]) 02/23/2022   Obstructive sleep apnea 06/30/2015   Polycystic ovarian disease    Past Surgical History:  Past Surgical History:  Procedure Laterality Date   BIOPSY  11/05/2021   Procedure: BIOPSY;  Surgeon: Daryel November, MD;  Location: [facility];  Service: Gastroenterology;;   CERVIX LESION DESTRUCTION     CESAREAN SECTION     ESOPHAGOGASTRODUODENOSCOPY (EGD) WITH PROPOFOL N/A 11/05/2021   Procedure: ESOPHAGOGASTRODUODENOSCOPY (EGD) WITH PROPOFOL;  Surgeon:  Daryel November, MD;  Location: [facility];  Service: Gastroenterology;  Laterality: N/A;   IR CT HEAD LTD  09/24/2022   IR PERCUTANEOUS ART THROMBECTOMY/INFUSION INTRACRANIAL INC DIAG ANGIO  09/24/2022   RADIOLOGY WITH ANESTHESIA N/A 09/24/2022   Procedure: IR WITH ANESTHESIA;  Surgeon: Radiologist, Medication, MD;  Location: [facility];  Service: Radiology;  Laterality: N/A;   RIGHT/LEFT HEART CATH AND CORONARY ANGIOGRAPHY N/A 03/29/2019   Procedure: RIGHT/LEFT HEART CATH AND CORONARY ANGIOGRAPHY;  Surgeon: Larey Dresser, MD;  Location: [facility];  Service: Cardiovascular;  Laterality: N/A;   RIGHT/LEFT HEART CATH AND CORONARY ANGIOGRAPHY N/A 09/20/2021   Procedure: RIGHT/LEFT HEART CATH AND CORONARY ANGIOGRAPHY;  Surgeon: Larey Dresser, MD;  Location: [facility];  Service: Cardiovascular;  Laterality: N/A;   HPI:  Pt is a 47 y.o. female who presented 2/29 for SOB related to acute on chronic CHF. MRI brain 3/3: Large area of acute infarct in the right MCA distribution, smaller area of acute infarct in the right ACA distribution, and  punctate acute infarct in the left cerebellar hemisphere. Persistent occlusion of a right M2 branch with minimal collateral flow in the MCA distribution aside from in the region of the frontal operculum. IR thrombectomy attempted 3/2, but was unsuccessful due to cardiac arrest during procedure with <1 minute of chest compressions.. Pt remained  remained intubated post-procedure. ETT 3/2-3/5 (self-extubation). Pt failed the Yale 3/6 due to coughing. PMH: CAD, DM2, HTN, a fib, asthma, anxiety, depression   Assessment / Plan / Recommendation Clinical Impression  Pt participated in speech-language-cognition evaluation. Pt was lethargic throughout the evaluation and the impact of this on her performance is considered. 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 She denied any baseline deficits in speech, language, or  cognition or any acute changes in these areas. The [facility] Mental Status Examination was completed to evaluate the pt's cognitive-linguistic skills. Her score was adjusted to exclude portions of the evaluation which required writing. Pt achieved an adjusted score of 5/24 which is below the normal limits. She exhibited deficits in the areas of orientation, memory, attention, and executive function as it relates to problem solving, awareness, and inhibition. Processing speed was reduced and repetition was frequently necessary during the evaluation. Pt demonstrated dysphonia characterized by a hoarse vocal quality and reduced vocal intensity; these factors significantly reduced speech intelligibility, but at least mild articulatory imprecision was noted. Pt demonstrated visual tracking to the right, but did not attend to the SLP when was positioned towards the left; assessment of vision by OT appears indicated. Skilled SLP services are clinically indicated at this time.    SLP Assessment  SLP Recommendation/Assessment: Patient needs continued Speech [place] Pathology Services SLP Visit Diagnosis: Cognitive communication deficit (R41.841)    Recommendations for follow up therapy are one component of a multi-disciplinary discharge planning process, led by the attending physician.  Recommendations may be updated based on patient status, additional functional criteria and insurance authorization.    Follow Up Recommendations  Acute inpatient rehab (3hours/day)    Assistance Recommended at Discharge  Frequent or constant Supervision/Assistance  Functional Status Assessment Patient has had  a recent decline in their functional status and demonstrates the ability to make significant improvements in function in a reasonable and predictable amount of time.  Frequency and Duration min 2x/week  2 weeks      SLP Evaluation Cognition  Overall Cognitive Status: Impaired/Different from  baseline Arousal/Alertness: Awake/alert Orientation Level: Oriented to person;Oriented to place;Disoriented to time;Oriented to situation Year:  (1964) Month: March Day of Week: Incorrect Attention: Focused;Sustained Focused Attention: Appears intact Sustained Attention: Impaired Sustained Attention Impairment: Verbal basic Memory: Impaired Memory Impairment: Retrieval deficit (Immediate: 5/5 with repetition x2; delayed: 1/5; with cues: 1/4) Awareness: Impaired Awareness Impairment: Intellectual impairment Problem Solving: Impaired Problem Solving Impairment: Verbal basic       Comprehension  Auditory Comprehension Overall Auditory Comprehension: Appears within functional limits for tasks assessed Yes/No Questions: Impaired Commands: Within Functional Limits Conversation: Complex    Expression Expression Primary Mode of Expression: Verbal Verbal Expression Overall Verbal Expression: Appears within functional limits for tasks assessed Initiation: No impairment Level of Generative/Spontaneous Verbalization: Conversation Repetition: No impairment   Oral / Motor  Oral Motor/Sensory Function Overall Oral Motor/Sensory Function: Mild impairment Lingual ROM: Reduced right;Suspected CN XII (hypoglossal) dysfunction;Reduced left Lingual Strength: Reduced;Suspected CN XII (hypoglossal) dysfunction Motor Speech Respiration: Impaired Level of Impairment: Sentence Phonation: Hoarse Resonance: Within functional limits Articulation: Impaired Level of Impairment: Sentence Intelligibility: Intelligibility reduced Word: 50-74% accurate Phrase: 50-74% accurate Sentence: 50-74% accurate Conversation: 50-74% accurate           Rashan Rounsaville I. [name], [place], [place] Office number [phone]  [name] 09/28/2022, 1:18 PM

## 2022-09-28 NOTE — Progress Notes (Signed)
PT Cancellation Note  Patient Details Name: [name] MRN: [id number] DOB: January 12, 1976   Cancelled Treatment:    Reason Eval/Treat Not Completed: Other (comment). SLP working with patient at this time. Acute PT to return as able to progress pt mobility.  Kittie Plater, PT, DPT Acute Rehabilitation Services Secure chat preferred Office #: [phone]    Berline Lopes 09/28/2022, 11:15 AM

## 2022-09-28 NOTE — Evaluation (Signed)
Clinical/Bedside Swallow Evaluation Patient Details  Name: [name] MRN: [id number] Date of Birth: 03-28-1976  Today's Date: 09/28/2022 Time: SLP Start Time (ACUTE ONLY): 1039 SLP Stop Time (ACUTE ONLY): [id number] SLP Time Calculation (min) (ACUTE ONLY): 13 min  Past Medical History:  Past Medical History:  Diagnosis Date   Abnormal Pap smear    Acute combined systolic and diastolic heart failure ([place]) 02/19/2019   Wt Readings from Last 3 Encounters:  09/18/21  84.7 kg  09/02/21  84.4 kg  08/31/21  84.2 kg         Acute heart failure ([place]) 01/29/2022   Acute on chronic combined systolic and diastolic CHF (congestive heart failure) ([place]) 02/19/2019   Acute on chronic congestive heart failure (HCC)    Acute on chronic congestive heart failure (HCC)    Acute on chronic diastolic CHF (congestive heart failure) ([place]) 10/19/2021   Acute on chronic systolic heart failure ([place]) 02/22/2022   Anemia    Asthma    Atrial fibrillation (HCC)    history of paroxysmal atrial fibrillation   CHF (congestive heart failure) ([place]) 11/02/2021   CHF exacerbation ([place]) 10/17/2021   Diabetes mellitus    DKA, type 2 ([place]) 02/06/2019   Elevated troponin 09/30/2019   Heart failure with reduced ejection fraction ([place])    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     Migraines    Morbid obesity ([place]) 02/06/2019   Nausea & vomiting 06/26/2022   Nonischemic cardiomyopathy ([place]) 09/22/2021   NSTEMI (non-ST elevated myocardial infarction) ([place]) 02/23/2022   Obstructive sleep apnea 06/30/2015   Polycystic ovarian disease    Past Surgical History:   Past Surgical History:  Procedure Laterality Date   BIOPSY  11/05/2021   Procedure: BIOPSY;  Surgeon: Daryel November, MD;  Location: [facility];  Service: Gastroenterology;;   CERVIX LESION DESTRUCTION     CESAREAN SECTION     ESOPHAGOGASTRODUODENOSCOPY (EGD) WITH PROPOFOL N/A 11/05/2021   Procedure: ESOPHAGOGASTRODUODENOSCOPY (EGD) WITH PROPOFOL;  Surgeon: Daryel November, MD;  Location: [facility];  Service: Gastroenterology;  Laterality: N/A;   IR CT HEAD LTD  09/24/2022   IR PERCUTANEOUS ART THROMBECTOMY/INFUSION INTRACRANIAL INC DIAG ANGIO  09/24/2022   RADIOLOGY WITH ANESTHESIA N/A 09/24/2022   Procedure: IR WITH ANESTHESIA;  Surgeon: Radiologist, Medication, MD;  Location: [facility];  Service: Radiology;  Laterality: N/A;   RIGHT/LEFT HEART CATH AND CORONARY ANGIOGRAPHY N/A 03/29/2019   Procedure: RIGHT/LEFT HEART CATH AND CORONARY ANGIOGRAPHY;  Surgeon: Larey Dresser, MD;  Location: [facility];  Service: Cardiovascular;  Laterality: N/A;   RIGHT/LEFT HEART CATH AND CORONARY ANGIOGRAPHY N/A 09/20/2021   Procedure: RIGHT/LEFT HEART CATH AND CORONARY ANGIOGRAPHY;  Surgeon: Larey Dresser, MD;  Location: [facility];  Service: Cardiovascular;  Laterality: N/A;   HPI:  Pt is a 47 y.o. female who presented 2/29 for SOB related to acute on chronic CHF. MRI brain 3/3: Large area of acute infarct in the right MCA distribution, smaller area of acute infarct in the right ACA distribution, and  punctate acute infarct in the left cerebellar hemisphere. Persistent occlusion of a  right M2 branch with minimal collateral flow in the MCA distribution aside from in the region of the frontal operculum. IR thrombectomy attempted 3/2, but was unsuccessful due to cardiac arrest during procedure with <1 minute of chest compressions.. Pt remained  remained intubated post-procedure. ETT 3/2-3/5 (self-extubation). Pt failed the Yale 3/6 due to coughing. PMH: CAD, DM2, HTN, a fib, asthma, anxiety,  depression    Assessment / Plan / Recommendation  Clinical Impression  Pt was seen for bedside swallow evaluation and she denied a history of dysphagia. Oral mechanism exam was significant for reduced lingual ROM and a hoarse vocal quality was noted; vocal fold insufficiency suspected in the setting of prolonged intubation. She presented with symptoms of oropharyngeal dysphagia characterized by mild oral holding, prolonged mastication, oral residue, and signs of aspiration with multiple ice chips, thin liquids via spoon. Pt demonstrated difficulty maintaining alertness for prolonged periods and the impact of this on her performance is considered. It is recommended that her NPO status be maintained and that enteral nutrition be continued, but she may have individual ice chips after oral care. SLP will follow to assess improvement in swallow function and for possible instrumental assessment pending her progress.` SLP Visit Diagnosis: Dysphagia, unspecified (R13.10)    Aspiration Risk  Mild aspiration risk    Diet Recommendation NPO;Alternative means - temporary;Ice chips PRN after oral care   Medication Administration: Via alternative means    Other  Recommendations Oral Care Recommendations: Oral care QID;Oral care prior to ice chip/H20    Recommendations for follow up therapy are one component of a multi-disciplinary discharge planning process, led by the attending physician.  Recommendations may be updated based on patient status, additional functional criteria and insurance authorization.  Follow up Recommendations Acute inpatient rehab (3hours/day)      Assistance Recommended at Discharge    Functional Status Assessment Patient has had a recent decline in their functional status and demonstrates the ability to make significant improvements in function in a reasonable and predictable amount of time.  Frequency and Duration min 2x/week  2 weeks       Prognosis Prognosis for improved  oropharyngeal function: Good Barriers to Reach Goals: Cognitive deficits;Severity of deficits      Swallow Study   General Date of Onset: 09/27/22 HPI: Pt is a 47 y.o. female who presented 2/29 for SOB related to acute on chronic CHF. MRI brain 3/3: Large area of acute infarct in the right MCA distribution, smaller area of acute infarct in the right ACA distribution, and  punctate acute infarct in the left cerebellar hemisphere. Persistent occlusion of a right M2 branch with minimal collateral flow in the MCA distribution aside from in the region of the frontal operculum. IR thrombectomy attempted 3/2, but was unsuccessful due to cardiac arrest during procedure with <1 minute of chest compressions.. Pt remained  remained intubated post-procedure. ETT 3/2-3/5 (self-extubation). Pt failed the Yale 3/6 due to coughing. PMH: CAD, DM2, HTN, a fib, asthma, anxiety, depression Type of Study: Bedside Swallow Evaluation Previous Swallow Assessment: none Diet Prior to this Study: NPO;Cortrak/Small bore NG tube Temperature Spikes Noted: No Respiratory Status: Nasal cannula History of Recent Intubation: Yes Total duration of intubation (days): 3 days Date extubated: 09/27/22 Behavior/Cognition: Cooperative;Pleasant mood;Lethargic/Drowsy;Requires cueing Oral Cavity Assessment: Within Functional Limits Oral Care Completed by SLP: No Oral Cavity - Dentition: Adequate natural dentition Self-Feeding Abilities: Total assist Patient Positioning: Upright in bed;Postural control adequate for testing Baseline Vocal Quality: Low vocal intensity Volitional Cough: Cognitively unable  to elicit Volitional Swallow: Unable to elicit    Oral/Motor/Sensory Function Overall Oral Motor/Sensory Function: Mild impairment Lingual ROM: Reduced right;Suspected CN XII (hypoglossal) dysfunction;Reduced left Lingual Strength: Reduced;Suspected CN XII (hypoglossal) dysfunction   Ice Chips Ice chips: Impaired Presentation:  Spoon Pharyngeal Phase Impairments: Cough - Immediate;Throat Clearing - Immediate   Thin Liquid Thin Liquid: Impaired Presentation: Spoon;Cup Pharyngeal  Phase Impairments: Cough - Delayed    Nectar Thick Nectar Thick Liquid: Not tested   Honey Thick Honey Thick Liquid: Not tested   Puree Puree: Impaired Presentation: Spoon Oral Phase Impairments: Reduced lingual movement/coordination Oral Phase Functional Implications: Oral residue;Oral holding   Solid     Solid: Impaired Oral Phase Impairments: Impaired mastication     Rod Majerus I. [name], [place], [place] Office number [phone]  [name] 09/28/2022,11:21 AM

## 2022-09-28 NOTE — Progress Notes (Signed)
Inpatient Rehabilitation Admissions Coordinator   I spoke by phone with daughter , Leroy Kennedy. She plans for her Mom to discharge home with her where she, her partner, and sister can provide 24/7 physical care after a CIR stay. Her address is [address] [place]. I await further progress with therapy to assess her tolerance to participate at [place] level rehab.  Danne Baxter, RN, MSN Rehab Admissions Coordinator [phone] 09/28/2022 2:13 PM

## 2022-09-28 NOTE — TOC Benefit Eligibility Note (Signed)
Patient Teacher, English as a foreign language completed.    The patient is currently admitted and upon discharge could be taking Eliquis 5 mg.  The current 30 day co-pay is $4.60.   The patient is insured through [place], [place] Patient Advocate Specialist [place] Patient Advocate Team Direct Number: [phone]  Fax: [phone]

## 2022-09-28 NOTE — Progress Notes (Signed)
ANTICOAGULATION CONSULT NOTE -   Pharmacy Consult for Heparin Indication:  LV Thrombus  Allergies  Allergen Reactions   Fish Allergy Swelling   Shellfish Allergy Anaphylaxis, Swelling and Other (See Comments)    Airway involvement including EMS - Has Epi-Pen   Metformin And Related Diarrhea   Trulicity [Dulaglutide] Nausea And Vomiting and Other (See Comments)    Multiple episodes of vomiting over multiple days   Nsaids Other (See Comments)    Per PCP interferes with daily meds (heart failure)   Advil [Ibuprofen] Other (See Comments)    Per PCP interferes with daily meds (heart failure)    Patient Measurements: Height: 4' 11.02" (149.9 cm) Weight: 89.9 kg (198 lb 3.1 oz) IBW/kg (Calculated) : 43.24 Heparin Dosing Weight: 65.6 kg  Vital Signs: Temp: 97.9 F (36.6 C) (03/06 0400) Temp Source: Axillary (03/06 0400) BP: 137/97 (03/06 0600) Pulse Rate: 82 (03/06 0600)  Labs: Recent Labs    09/26/22 0413 09/26/22 [id number] 09/26/22 2155 09/27/22 0459 09/28/22 0456  HGB 12.1  --   --  11.9* 12.1  HCT 37.1  --   --  36.0 38.5  PLT 233  --   --  236 238  HEPARINUNFRC  --    < > 0.40 0.42 0.34  CREATININE 1.18*  --   --  0.92 1.02*   < > = values in this interval not displayed.     Estimated Creatinine Clearance: 68.8 mL/min (A) (by C-G formula based on SCr of 1.02 mg/dL (H)).   Medical History: Past Medical History:  Diagnosis Date   Abnormal Pap smear    Acute combined systolic and diastolic heart failure ([place]) 02/19/2019   Wt Readings from Last 3 Encounters:  09/18/21  84.7 kg  09/02/21  84.4 kg  08/31/21  84.2 kg         Acute heart failure ([place]) 01/29/2022   Acute on chronic combined systolic and diastolic CHF (congestive heart failure) ([place]) 02/19/2019   Acute on chronic congestive heart failure (HCC)    Acute on chronic congestive heart failure (HCC)    Acute on chronic diastolic CHF (congestive heart failure) ([place]) 10/19/2021   Acute on chronic systolic heart  failure ([place]) 02/22/2022   Anemia    Asthma    Atrial fibrillation ([place])    history of paroxysmal atrial fibrillation   CHF (congestive heart failure) ([place]) 11/02/2021   CHF exacerbation ([place]) 10/17/2021   Diabetes mellitus    DKA, type 2 ([place]) 02/06/2019   Elevated troponin 09/30/2019   Heart failure with reduced ejection fraction ([place])    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     Migraines    Morbid obesity ([place]) 02/06/2019   Nausea & vomiting 06/26/2022   Nonischemic cardiomyopathy ([place]) 09/22/2021   NSTEMI (non-ST elevated myocardial infarction) ([place]) 02/23/2022   Obstructive sleep apnea 06/30/2015   Polycystic ovarian disease     Assessment:  47 y.o. 2 week history of lower extremity edema, shortness of breath, and chest pain. Patient developed stroke as inpatient. Now found to have an LV Thrombus.  Pharmacy consulted to start heparin infusion for LV  thrombus using the stroke protocol (no bolus).  Patient on warfarin PTA.  HL 0.34 on 950 units/h - therapeutic. CBC stable   Goal of Therapy:  Heparin level 0.3-0.5 units/ml Monitor platelets by anticoagulation protocol: Yes   Plan:  Continue heparin infusion at 950 units/hr Check anti-Xa level daily while on heparin Continue to monitor H&H and platelets  Wilson Singer, PharmD Clinical Pharmacist 09/28/2022 7:10 AM

## 2022-09-28 NOTE — Progress Notes (Signed)
STROKE TEAM PROGRESS NOTE   INTERVAL HISTORY Her speech therapist is at the bedside. Pt self extubated yesterday and tolerating well so far. She still lethargic, but improved from yesterday. Awake alert, not orientated to year but otherwise orientated. 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 Did not pass swallow, on cortrak for tube feeding.    Vitals:   09/28/22 0500 09/28/22 0600 09/28/22 0700 09/28/22 0800  BP: (!) 148/104 (!) 137/97 (!) 128/91 (!) 150/100  Pulse: 84 82 83 88  Resp: (!) 0 (!) 0 (!) 0 (!) 0  Temp:    97.9 F (36.6 C)  TempSrc:    Axillary  SpO2: 96% 99% 98% 98%  Weight:      Height:       CBC:  Recent Labs  Lab 09/22/22 2050 09/23/22 0514 09/27/22 0459 09/28/22 0456  WBC 8.7   < > 12.4* 13.6*  NEUTROABS 6.0  --   --   --   HGB 13.0   < > 11.9* 12.1  HCT 41.3   < > 36.0 38.5  MCV 71.1*   < > 70.2* 72.1*  PLT 232   < > 236 238   < > = values in this interval not displayed.   Basic Metabolic Panel:  Recent Labs  Lab 09/27/22 0459 09/27/22 1746 09/28/22 0456  NA 135  --  135  K 4.1  --  4.0  CL 100  --  100  CO2 26  --  26  GLUCOSE 138*  --  184*  BUN 21*  --  27*  CREATININE 0.92  --  1.02*  CALCIUM 8.3*  --  8.6*  MG 2.3 2.3  --   PHOS 4.3 3.9  --    Lipid Panel:  Recent Labs  Lab 09/24/22 0130 09/25/22 0508 09/28/22 0456  CHOL 136  --   --   TRIG 62   < > 63  HDL 27*  --   --   CHOLHDL 5.0  --   --   VLDL 12  --   --   LDLCALC 97  --   --    < > = values in this interval not displayed.   HgbA1c:  Recent Labs  Lab 09/24/22 0130  HGBA1C 10.8*   Urine Drug Screen: No results for input(s): "LABOPIA", "COCAINSCRNUR", "LABBENZ", "AMPHETMU", "THCU", "LABBARB" in the last 168 hours.  Alcohol Level No results for input(s): "ETH" in the last 168 hours.  IMAGING past 24 hours DG CHEST PORT 1 VIEW  Result Date: 09/28/2022 CLINICAL DATA:  ETT EXAM: PORTABLE CHEST 1 VIEW COMPARISON:  CXR 09/24/22 FINDINGS: Interval extubation. No  pneumothorax. Persistent cardiomegaly. Compared to prior exam there are likely new bilateral small pleural effusions, left-greater-than-right. There is a hazy opacity at the left lung base which could represent atelectasis or infection. Prominent bilateral interstitial opacities favored to represent pulmonary venous congestion. No radiographically apparent displaced rib fractures. Visualized upper abdomen is unremarkable. IMPRESSION: 1. Interval extubation. 2. New bilateral small pleural effusions, left-greater-than-right. 3. New hazy opacity at the left lung base which could represent atelectasis or infection. 4. Cardiomegaly and mild pulmonary edema. Electronically Signed   By: Marin Roberts M.D.   On: 09/28/2022 08:33    PHYSICAL EXAM  Temp:  [97.7 F (36.5 C)-98.3 F (36.8 C)] 97.9 F (36.6 C) (03/06 0800) Pulse Rate:  [80-98] 88 (03/06 0800) Resp:  [0-25] 0 (03/06 0800) BP: (117-150)/(86-118) 150/100 (03/06 0800) SpO2:  [95 %-100 %] 98 % (  03/06 0800)   General - Well nourished, well developed, lethargic but not in distress.   Ophthalmologic - fundi not visualized due to noncooperation.   Cardiovascular - Regular rate and rhythm, not in afib.   Neuro - lethargic but improved from yesterday, awake alert, eyes open, following simple commands. Eyes right gaze preference, barely cross midline, blinking to visual threat on the right but not on the left. PERRL. Left facial droop and tongue protrusion midline. RUE 3/5. LUE flaccid. RLE withdraw to pain. LLE in boot, flicker movement with painful stimuli. Sensation, coordination not very cooperative and gait not tested.     ASSESSMENT/PLAN Ms. CASELYN BUTT is a 47 y.o. female with extensive PMH that includes but not limited to Acute combined systolic and diastolic congestive heart failure, history of LV thrombus, prior NSTEMMI, prior PE, A-fib, type 2 diabetes, HTN, depression, and OSA who presented 3/1 with 2 week history of lower extremity  edema, shortness of breath, and chest pain.  Neurology was consulted for code stroke 3/2 due to left-sided weakness.  Patient underwent IR procedure due to right M2 occlusion she remained intubated postprocedure.  Stroke workup as detailed below.  3/3 MRI negative for hemorrhage, will restart heparin drip.  Will plan for SBT and hopeful extubation early tomorrow a.m.  Appreciate CCM assistance with this.  Stroke - right MCA infarct with right M2 occlusion, s/p IR with TICI2b, etiology likely embolic from known LV thrombus, cardiomyopathy with low EF and PAF even on heparin IV Code Stroke CT head: Loss of gray-white differentiation in the insula likely reflecting early infarct.  Aspects is 9 CTA head & neck:  Proximal right M2 occlusion with minimal collateral flow throughout the MCA distribution. IR - Occluded inferior division of the right middle cerebral artery in the mid M2 segment. TICI 2B revascularization.  Post IR CT: no evidence of intracranial hemorrhage.  MRI: Large area of acute infarct in the right MCA distribution, smaller area of acute infarct in the right ACA distribution, and punctate acute infarct in the left cerebellar hemisphere. No evidence of hemorrhagic transformation or midline shift. MRA head - Persistent occlusion of a right M2 branch with minimal collateral flow in the MCA distribution aside from in the region of the frontal operculum. CT repeat 3/4 stable right superior division MCA territory and distal right ACA territory infarcts with petechial hemorrhage. 2D Echo: Large LV apical thrombus present, LVEF 15% LDL 97 HgbA1c 10.8 VTE prophylaxis - SCDs, Heparin IV  warfarin daily prior to admission (?compliance), now on heparin IV.  Therapy recommendations:  CIR Disposition:  pending  Brief Cardiac Arrest during IR procedure 73mns of ACLS before ROSC achieved Cardiology/CCM on board  Acute Respiratory Failure Self extubated 3/5 Tolerating well so far Close  monitoring Appreciate CCM assistance Leukocytosis WBC 10.3->12.4->13.6 CXR L>R pleural effusion and new left lung base hazy opacity Afebrile   Acute on chronic combined congestive heart failure Nonischemic cardiomyopathy 2D Echo: Large LV apical thrombus present, LVEF 15% Subtherapeutic INR on admission Resumed Heparin gtt Cardiology on board R/L cath 08/2021 showed mild, nonobstructive CAD CXR with cardiomegaly and mild pulmonary vascular congestion. Strict Intake/Output, Foley catheter, daily weights Resume Toprol and spironolactone 3/4 Resume Losartan '50mg'$  on 3/6 Hold off Jardiance due to NPO  Paroxysmal Atrial Fibrillation Resumed Heparin gtt  Continuous telemetry monitoring  Hx of hypertension hypotension Stable on the low end Off Neo Resumed Toprol and spironolactone and cozaar  BP goal 120-180 Long-term BP goal normotensive  Hyperlipidemia Home  meds:  none LDL 97, goal < 70 on lipitor '80mg'$   Continue statin at discharge  Diabetes type II Uncontrolled Home meds:  novolog flexpen, and Jardiance HgbA1c 10.8, goal < 7.0 CBG  SSI Hold off jardiance until able to have po Close PCP follow up for better DM control  Dysphagia  NPO now Consider TF today after CT Consider cortrak if planning for extubation  Other Stroke Risk Factors Obesity, Body mass index is 40.01 kg/m., BMI >/= 30 associated with increased stroke risk, recommend weight loss, diet and exercise as appropriate  Coronary artery disease / NSTEMI Migraines Obstructive sleep apnea  Other Active Problems Hypokalemia, resolved. Goal >4 Hypomagnesia, resolved. [place] >2.  AKI Cre 1.04->1.14->1.18->0.92->1.02 Leukocytosis WBC 10.3->12.4->13.6, repeat UA pending  Hospital day # 5  This patient is critically ill due to right MCA stroke, status post mechanical thrombectomy, LV thrombus, A-fib, cardiomyopathy, respiratory failure and at significant risk of neurological worsening, death form recurrent  stroke, hemorrhagic transformation, heart failure, arrhythmia, seizure. This patient's care requires constant monitoring of vital signs, hemodynamics, respiratory and cardiac monitoring, review of multiple databases, neurological assessment, discussion with family, other specialists and medical decision making of high complexity. I spent 35 minutes of neurocritical care time in the care of this patient.    Rosalin Hawking, MD PhD Stroke Neurology 09/28/2022 11:23 AM     To contact Stroke Continuity provider, please refer to [email or web address] After hours, contact General Neurology

## 2022-09-28 NOTE — Progress Notes (Signed)
NAME:  [name], MRN:  [id number], DOB:  Jun 20, 1976, LOS: 5 ADMISSION DATE:  09/22/2022, CONSULTATION DATE:  09/24/2022 REFERRING MD:  Dr. Allegra Grana, CHIEF COMPLAINT:  Vent management   History of Present Illness:  [name] is a 47 y.o. with an extensive PMH that includes but not limited to Acute combined systolic and diastolic congestive heart failure, history of LV thrombus, prior NSTEMI, prior PE, A-fib, type 2 diabetes, HTN, depression, and OSA who presented 3/1 with 2 week history of lower extremity edema, shortness of breath, and chest pain. Patient was admitted per FMTS for management of acute CHF exacerbation and possible NSTEMI  Afternoon of 3/2 patient suffered acute change in mental status resulting in code stroke being called.  This is in the setting of subtherapeutic INR of 1.3 despite anticoagulation with Coumadin on admission with history of chronic LV thrombus and paroxysmal atrial fibrillation.  CTA head revealed likely evolving stroke with proximal right M2 occlusion with minimal collateral flow throughout the MCA distribution.  Neurology and interventional neurology consulted and patient was taken urgently for thrombectomy.  Postoperatively patient remained on ventilator prompting PCCM consult.  Pertinent  Medical History  Includes but not limited to: Acute combined systolic and diastolic congestive heart failure, history of LV thrombus, prior NSTEMI, prior PE, A-fib, type 2 diabetes, HTN, depression, and OSA  Significant Hospital Events: Including procedures, antibiotic start and stop dates in addition to other pertinent events   3/2 Sedated on vent  3/5 Repeat CT head does not demonstrate hemorrhagic conversion as was the concern, Echo 3/3 with LVEF 15% and large LV thrombus seen. RV with mod reduced systolic function as well.  3/6 self-extubated yesterday, has remained stable from respiratory perspective since  Interim History / Subjective:   No overnight  events Remains on heparin  Respiratory status stable after self-extubating She wants her cortrak out  Objective   Blood pressure (!) 137/97, pulse 82, temperature 97.9 F (36.6 C), temperature source Axillary, resp. rate (!) 0, height 4' 11.02" (1.499 m), weight 89.9 kg, last menstrual period 06/22/2022, SpO2 99 %.    Vent Mode: CPAP;PSV FiO2 (%):  [40 %] 40 % PEEP:  [5 cmH20] 5 cmH20 Pressure Support:  [10 cmH20] 10 cmH20   Intake/Output Summary (Last 24 hours) at 09/28/2022 0739 Last data filed at 09/28/2022 0600 Gross per 24 hour  Intake 1043.16 ml  Output --  Net 1043.16 ml    Filed Weights   09/25/22 1615 09/26/22 0419 09/27/22 0500  Weight: 92.6 kg 91.7 kg 89.9 kg   General:  well nourished F, awake and in no distress HEENT: MM pink/moist, sclera anicteric Neuro: awake, alert and oriented, following commands but hemiplegic on the L, no facial droop CV: s1s2 rrr, no m/r/g PULM:  clear bilaterally with equal chest rise and no distress GI: soft, non-tender Extremities: warm/dry, no edema  Skin: no rashes or lesions    Resolved Hospital Problem list     Assessment & Plan:   Postoperative ventilator management  -self extubated 3/5 and has remained stable -d/c fentanyl, prn oxy   Acute CVA  Acute neuro change seen in hospital with code stroke called.  CTA head revealed likely evolving stroke with proximal right M2 occlusion with minimal collateral flow throughout the MCA distribution.  Neurology and interventional neurology consulted and patient was taken urgently for thrombectomy -Primary management per neurology  -Maintain neuro protective measures; goal for eurothermia, euglycemia, eunatermia, normoxia, and PCO2 goal of 35-40 -Seizure precautions  -  Aspirations precautions  -continue statin    In hospital cardiac arrest On induction for thrombectomy patient lost blood pressure follow by lose of pulses, 59mns of ACLS protocol provided prior to [place]-Continuous  telemetry -Continue pressors for MAP goal >65 -oriented, reassuring for no anoxic injury  Acute on chronic combined systolic and diastolic congestive heart failure  Nonischemic cardiomyopathy  HTN Paroxysmal Atrial Fibrillation Elevated Troponin  Most recent ECHO August 2023 with ED < 20% and grade III diastolic dysfunction  -Primary management per cardiology -Continuous telemetry -Strict intake and output -Daily weight to assess volume status -Metoprolol and spironolactone resumed, BP elevated, increase from 12.5 to '25mg'$  bid -continue holding lasix, Losartan,  and Jardance -Closely monitor renal function and electrolytes -Optimize electrolytes -on heparin, plan to transition to eliquis, defer timing to primary team  Chronic LV thrombus  Subtherapeutic INR on Coumadin -ECHO August 2023 with 2.2 x 1.4LV thrombus at the apex with some mobility -INR on admit 1.3 P: -Heparin infusion, transition to eliquis per primary team   Type 2 diabetes  A1C 10.8 on admit  -SSI  -CBG goal 140-180  Hypokalemia -Trend Bmet -Supplement as needed   Nutrition - TF advance as tolerated, remove cortak if able to pass swallow  Best Practice (right click and "Reselect all SmartList Selections" daily)   Diet/type: tubefeeds DVT prophylaxis: systemic heparin GI prophylaxis: PPI Lines: N/A Foley:  N/A Code Status:  full code Last date of multidisciplinary goals of care discussion: Per primary  Labs   CBC: Recent Labs  Lab 09/22/22 2050 09/23/22 0514 09/24/22 0130 09/25/22 0508 09/26/22 0413 09/27/22 0459 09/28/22 0456  WBC 8.7   < > 6.7 7.9 10.3 12.4* 13.6*  NEUTROABS 6.0  --   --   --   --   --   --   HGB 13.0   < > 12.3 12.5 12.1 11.9* 12.1  HCT 41.3   < > 39.4 38.5 37.1 36.0 38.5  MCV 71.1*   < > 71.0* 70.8* 70.1* 70.2* 72.1*  PLT 232   < > 213 234 233 236 238   < > = values in this interval not displayed.     Basic Metabolic Panel: Recent Labs  Lab 09/24/22 2231  09/25/22 0508 09/26/22 0413 09/26/22 1458 09/27/22 0459 09/27/22 1746 09/28/22 0456  NA 135 136 136  --  135  --  135  K 4.1 4.6 4.8  --  4.1  --  4.0  CL 96* 96* 98  --  100  --  100  CO2 '27 26 24  '$ --  26  --  26  GLUCOSE 151* 140* 141*  --  138*  --  184*  BUN '12 13 16  '$ --  21*  --  27*  CREATININE 1.08* 1.14* 1.18*  --  0.92  --  1.02*  CALCIUM 8.6* 8.9 8.6*  --  8.3*  --  8.6*  MG 1.9 2.4 2.5* 2.4 2.3 2.3  --   PHOS  --  5.4*  --  5.2* 4.3 3.9  --     GFR: Estimated Creatinine Clearance: 68.8 mL/min (A) (by C-G formula based on SCr of 1.02 mg/dL (H)). Recent Labs  Lab 09/25/22 0508 09/26/22 0413 09/27/22 0459 09/28/22 0456  WBC 7.9 10.3 12.4* 13.6*     Liver Function Tests: No results for input(s): "AST", "ALT", "ALKPHOS", "BILITOT", "PROT", "ALBUMIN" in the last 168 hours. No results for input(s): "LIPASE", "AMYLASE" in the last 168 hours. No results  for input(s): "AMMONIA" in the last 168 hours.  ABG    Component Value Date/Time   PHART 7.451 (H) 09/17/2021 2204   PCO2ART 31.6 (L) 09/17/2021 2204   PO2ART 114 (H) 09/17/2021 2204   HCO3 28.1 (H) 09/22/2022 2128   TCO2 33 (H) 09/20/2021 0840   TCO2 32 09/20/2021 0840   ACIDBASEDEF 0.8 10/18/2021 0750   O2SAT 80.3 09/22/2022 2128     Coagulation Profile: Recent Labs  Lab 09/22/22 2134  INR 1.3*     Cardiac Enzymes: No results for input(s): "CKTOTAL", "CKMB", "CKMBINDEX", "TROPONINI" in the last 168 hours.  HbA1C: HbA1c, POC (controlled diabetic range)  Date/Time Value Ref Range Status  08/31/2021 11:20 AM 13.2 (A) 0.0 - 7.0 % Final  12/11/2019 10:39 AM 9.3 (A) 0.0 - 7.0 % Final   Hgb A1c MFr Bld  Date/Time Value Ref Range Status  09/24/2022 01:30 AM 10.8 (H) 4.8 - 5.6 % Final    Comment:    (NOTE)         Prediabetes: 5.7 - 6.4         Diabetes: >6.4         Glycemic control for adults with diabetes: <7.0   09/23/2022 12:18 PM 10.8 (H) 4.8 - 5.6 % Final    Comment:    (NOTE)          Prediabetes: 5.7 - 6.4         Diabetes: >6.4         Glycemic control for adults with diabetes: <7.0     CBG: Recent Labs  Lab 09/27/22 1121 09/27/22 1529 09/27/22 1922 09/27/22 2340 09/28/22 0323  GLUCAP 179* 159* 167* 173* 189*       Critical care time:      Otilio Carpen Janiyha Montufar, PA-C [facility] See Amion for pager If no response to pager , please call [phone] until 7pm After 7:00 pm call Elink  [phone]?[place]

## 2022-09-28 NOTE — Progress Notes (Signed)
  Inpatient Rehabilitation Admissions Coordinator   Met with patient at bedside for rehab assessment. She is falling asleep as I begin conversation with her. I will contact her daughters to discuss rehab venue options. Please call me with any questions.   Danne Baxter, RN, MSN Rehab Admissions Coordinator [phone]

## 2022-09-29 DIAGNOSIS — I5043 Acute on chronic combined systolic (congestive) and diastolic (congestive) heart failure: Secondary | ICD-10-CM | POA: Diagnosis not present

## 2022-09-29 DIAGNOSIS — I63411 Cerebral infarction due to embolism of right middle cerebral artery: Secondary | ICD-10-CM | POA: Diagnosis not present

## 2022-09-29 DIAGNOSIS — I513 Intracardiac thrombosis, not elsewhere classified: Secondary | ICD-10-CM | POA: Diagnosis not present

## 2022-09-29 LAB — HEPARIN LEVEL (UNFRACTIONATED): Heparin Unfractionated: 0.34 IU/mL (ref 0.30–0.70)

## 2022-09-29 LAB — CBC
HCT: 37.6 % (ref 36.0–46.0)
Hemoglobin: 11.6 g/dL — ABNORMAL LOW (ref 12.0–15.0)
MCH: 22.4 pg — ABNORMAL LOW (ref 26.0–34.0)
MCHC: 30.9 g/dL (ref 30.0–36.0)
MCV: 72.7 fL — ABNORMAL LOW (ref 80.0–100.0)
Platelets: 260 10*3/uL (ref 150–400)
RBC: 5.17 MIL/uL — ABNORMAL HIGH (ref 3.87–5.11)
RDW: 23.7 % — ABNORMAL HIGH (ref 11.5–15.5)
WBC: 10.5 10*3/uL (ref 4.0–10.5)
nRBC: 0.2 % (ref 0.0–0.2)

## 2022-09-29 LAB — BASIC METABOLIC PANEL
Anion gap: 11 (ref 5–15)
BUN: 30 mg/dL — ABNORMAL HIGH (ref 6–20)
CO2: 27 mmol/L (ref 22–32)
Calcium: 8.8 mg/dL — ABNORMAL LOW (ref 8.9–10.3)
Chloride: 98 mmol/L (ref 98–111)
Creatinine, Ser: 0.92 mg/dL (ref 0.44–1.00)
GFR, Estimated: >60 mL/min (ref >60)
Glucose, Bld: 172 mg/dL — ABNORMAL HIGH (ref 70–99)
Potassium: 3.9 mmol/L (ref 3.5–5.1)
Sodium: 136 mmol/L (ref 135–145)

## 2022-09-29 LAB — GLUCOSE, CAPILLARY
Glucose-Capillary: 183 mg/dL — ABNORMAL HIGH (ref 70–99)
Glucose-Capillary: 178 mg/dL — ABNORMAL HIGH (ref 70–99)
Glucose-Capillary: 210 mg/dL — ABNORMAL HIGH (ref 70–99)
Glucose-Capillary: 198 mg/dL — ABNORMAL HIGH (ref 70–99)
Glucose-Capillary: 185 mg/dL — ABNORMAL HIGH (ref 70–99)
Glucose-Capillary: 123 mg/dL — ABNORMAL HIGH (ref 70–99)

## 2022-09-29 MED ORDER — LOSARTAN POTASSIUM 50 MG PO TABS
50.0000 mg | ORAL_TABLET | Freq: Every day | ORAL | Status: DC
  Administered 2022-09-30 – 2022-10-01 (×2): 50 mg via ORAL
  Filled 2022-09-29 (×2): qty 1

## 2022-09-29 MED ORDER — POLYETHYLENE GLYCOL 3350 17 G PO PACK
17.0000 g | PACK | Freq: Every day | ORAL | Status: DC
  Filled 2022-09-29 (×2): qty 1

## 2022-09-29 MED ORDER — TORSEMIDE 20 MG PO TABS
40.0000 mg | ORAL_TABLET | Freq: Every day | ORAL | Status: DC
  Administered 2022-09-29: 40 mg
  Filled 2022-09-29: qty 2

## 2022-09-29 MED ORDER — OXYCODONE HCL 5 MG PO TABS
5.0000 mg | ORAL_TABLET | ORAL | Status: DC | PRN
  Administered 2022-09-29 – 2022-09-30 (×3): 10 mg via ORAL
  Filled 2022-09-29 (×3): qty 2

## 2022-09-29 MED ORDER — DOCUSATE SODIUM 50 MG/5ML PO LIQD
100.0000 mg | Freq: Two times a day (BID) | ORAL | Status: DC
  Administered 2022-09-29 – 2022-09-30 (×3): 100 mg via ORAL
  Filled 2022-09-29 (×4): qty 10

## 2022-09-29 MED ORDER — ATORVASTATIN CALCIUM 80 MG PO TABS
80.0000 mg | ORAL_TABLET | Freq: Every day | ORAL | Status: DC
  Administered 2022-09-29 – 2022-09-30 (×2): 80 mg via ORAL
  Filled 2022-09-29 (×2): qty 1

## 2022-09-29 MED ORDER — METOPROLOL TARTRATE 25 MG PO TABS
25.0000 mg | ORAL_TABLET | Freq: Two times a day (BID) | ORAL | Status: DC
  Administered 2022-09-29 – 2022-10-01 (×4): 25 mg via ORAL
  Filled 2022-09-29 (×4): qty 1

## 2022-09-29 MED ORDER — EMPAGLIFLOZIN 10 MG PO TABS
10.0000 mg | ORAL_TABLET | Freq: Every day | ORAL | Status: DC
  Administered 2022-09-29 – 2022-10-01 (×3): 10 mg via ORAL
  Filled 2022-09-29 (×3): qty 1

## 2022-09-29 MED ORDER — TORSEMIDE 20 MG PO TABS
40.0000 mg | ORAL_TABLET | Freq: Every day | ORAL | Status: DC
  Administered 2022-09-30 – 2022-10-01 (×2): 40 mg via ORAL
  Filled 2022-09-29 (×2): qty 2

## 2022-09-29 MED ORDER — ADULT MULTIVITAMIN W/MINERALS CH
1.0000 | ORAL_TABLET | Freq: Every day | ORAL | Status: DC
  Administered 2022-09-30 – 2022-10-01 (×2): 1 via ORAL
  Filled 2022-09-29 (×2): qty 1

## 2022-09-29 MED ORDER — SPIRONOLACTONE 25 MG PO TABS
25.0000 mg | ORAL_TABLET | Freq: Every day | ORAL | Status: DC
  Administered 2022-09-30 – 2022-10-01 (×2): 25 mg via ORAL
  Filled 2022-09-29 (×2): qty 1

## 2022-09-29 NOTE — Inpatient Diabetes Management (Signed)
Inpatient Diabetes Program Recommendations  AACE/ADA: New Consensus Statement on Inpatient Glycemic Control (2015)  Target Ranges:  Prepandial:   less than 140 mg/dL      Peak postprandial:   less than 180 mg/dL (1-2 hours)      Critically ill patients:  140 - 180 mg/dL    Latest Reference Range & Units 09/27/22 23:40 09/28/22 03:23 09/28/22 07:53 09/28/22 11:38 09/28/22 15:24 09/28/22 19:31  Glucose-Capillary 70 - 99 mg/dL 173 (H)  3 units Novolog 189 (H)  3 units Novolog  173 (H)  3 units Novolog 202 (H)  5 units Novolog 180 (H)  3 units Novolog 189 (H)  3 units Novolog  (H): Data is abnormally high  Latest Reference Range & Units 09/28/22 23:17 09/29/22 03:49 09/29/22 07:33  Glucose-Capillary 70 - 99 mg/dL 165 (H)  3 units Novolog 183 (H)  3 units Novolog 178 (H)  3 units Novolog  (H): Data is abnormally high      Home DM Meds: Lantus 15 units daily      Novolog 12 units at supper       Jardiance 10 mg daily    Current Orders: Novolog Moderate Correction Scale/ SSI (0-15 units) Q4 hours    MD- Note pt getting Tube Feedings running 60cc/hr  May consider adding Low dose Novolog Tube Feed Coverage:  Novolog 2 units Q4 hours HOLD if tube feeds HELD for any reason     --Will follow patient during hospitalization--  Wyn Quaker RN, MSN, [place] Diabetes Coordinator Inpatient Glycemic Control Team Team Pager: [phone] (8a-5p)

## 2022-09-29 NOTE — Progress Notes (Addendum)
NAME:  [name], MRN:  [id number], DOB:  1976/01/08, LOS: 6 ADMISSION DATE:  09/22/2022, CONSULTATION DATE:  09/24/2022 REFERRING MD:  Dr. Allegra Grana, CHIEF COMPLAINT:  Vent management   History of Present Illness:  [name] is a 47 y.o. with an extensive PMH that includes but not limited to Acute combined systolic and diastolic congestive heart failure, history of LV thrombus, prior NSTEMI, prior PE, A-fib, type 2 diabetes, HTN, depression, and OSA who presented 3/1 with 2 week history of lower extremity edema, shortness of breath, and chest pain. Patient was admitted per FMTS for management of acute CHF exacerbation and possible NSTEMI  Afternoon of 3/2 patient suffered acute change in mental status resulting in code stroke being called.  This is in the setting of subtherapeutic INR of 1.3 despite anticoagulation with Coumadin on admission with history of chronic LV thrombus and paroxysmal atrial fibrillation.  CTA head revealed likely evolving stroke with proximal right M2 occlusion with minimal collateral flow throughout the MCA distribution.  Neurology and interventional neurology consulted and patient was taken urgently for thrombectomy.  Postoperatively patient remained on ventilator prompting PCCM consult.  Pertinent  Medical History  Includes but not limited to: Acute combined systolic and diastolic congestive heart failure, history of LV thrombus, prior NSTEMI, prior PE, A-fib, type 2 diabetes, HTN, depression, and OSA  Significant Hospital Events: Including procedures, antibiotic start and stop dates in addition to other pertinent events   3/2 Sedated on vent  3/5 Repeat CT head does not demonstrate hemorrhagic conversion as was the concern, Echo 3/3 with LVEF 15% and large LV thrombus seen. RV with mod reduced systolic function as well.  3/6 self-extubated yesterday, has remained stable from respiratory perspective since 3/7 Stable, did not pass swallow evaluation again  yesterday  Interim History / Subjective:   Remains stable without overnight events Asking for discharge Did not pass her swallow screen yesterday  Objective   Blood pressure (!) 129/104, pulse 78, temperature 98.1 F (36.7 C), temperature source Axillary, resp. rate 13, height 4' 11.02" (1.499 m), weight 90.3 kg, last menstrual period 06/22/2022, SpO2 95 %.        Intake/Output Summary (Last 24 hours) at 09/29/2022 0750 Last data filed at 09/29/2022 0700 Gross per 24 hour  Intake 1620.32 ml  Output 405 ml  Net 1215.32 ml    Filed Weights   09/26/22 0419 09/27/22 0500 09/29/22 0416  Weight: 91.7 kg 89.9 kg 90.3 kg   General:  well nourished F, awake and in no distress HEENT: MM pink/moist, sclera anicteric, pupils equal Neuro: awake, alert and oriented to person, following commands but remains hemiplegic on the L, no facial droop CV: s1s2 rrr, no m/r/g PULM:  clear bilaterally with equal chest rise and no distress GI: soft, non-tender Extremities: warm/dry, no edema  Skin: no rashes or lesions    Resolved Hospital Problem list    Postoperative ventilator management   Assessment & Plan:     Acute CVA  Acute neuro change seen in hospital with code stroke called.  CTA head revealed likely evolving stroke with proximal right M2 occlusion with minimal collateral flow throughout the MCA distribution.  Neurology and interventional neurology consulted and patient was taken urgently for thrombectomy with TICI 2B revascularization -Primary management per neurology  -Maintain neuro protective measures; goal for eurothermia, euglycemia, eunatermia, normoxia, and PCO2 goal of 35-40 -Seizure precautions  -Aspirations precautions  -continue statin    In hospital cardiac arrest On induction for  thrombectomy patient lost blood pressure follow by lose of pulses, 43mns of ACLS protocol provided prior to ROSC -Continuous telemetry -Continue pressors for MAP goal >65 -oriented,  reassuring for no severe anoxic injury  Acute on chronic combined systolic and diastolic congestive heart failure  Nonischemic cardiomyopathy  HTN Paroxysmal Atrial Fibrillation Elevated Troponin  Most recent ECHO August 2023 with ED < 20% and grade III diastolic dysfunction  Repeat Echo 3/3 with EF <15% and Grade III diastolic dysfunction -Primary management per cardiology -Continuous telemetry -Strict intake and output -Daily weight to assess volume status -Metoprolol and spironolactone resumed, BP elevated, increase from 12.5 to '25mg'$  bid -resume Torsemide at '40mg'$  qd today -continue holding losartan -Closely monitor renal function and electrolytes -Optimize electrolytes -on heparin, plan to transition to eliquis, defer timing to primary team  Chronic LV thrombus  Subtherapeutic INR on Coumadin ECHO August 2023 with 2.2 x 1.4LV thrombus at the apex with some mobility INR on admit 1.3 -Heparin infusion, transition to eliquis per primary team   Small pleural effusions and mild pulmonary edema On latest CXR -resume Torsemide  Type 2 diabetes  A1C 10.8 on admit  -SSI  -CBG goal 140-180  Hypokalemia -Trend Bmet -Supplement as needed    Nutrition - TF advance as tolerated, did not pass most recent swallow, so will need continued cortrak and TF  Best Practice (right click and "Reselect all SmartList Selections" daily)   Diet/type: tubefeeds DVT prophylaxis: systemic heparin GI prophylaxis: PPI Lines: N/A Foley:  N/A Code Status:  full code Last date of multidisciplinary goals of care discussion: Per primary  Labs   CBC: Recent Labs  Lab 09/22/22 2050 09/23/22 0514 09/25/22 0508 09/26/22 0413 09/27/22 0459 09/28/22 0456 09/29/22 0453  WBC 8.7   < > 7.9 10.3 12.4* 13.6* 10.5  NEUTROABS 6.0  --   --   --   --   --   --   HGB 13.0   < > 12.5 12.1 11.9* 12.1 11.6*  HCT 41.3   < > 38.5 37.1 36.0 38.5 37.6  MCV 71.1*   < > 70.8* 70.1* 70.2* 72.1* 72.7*  PLT  232   < > 234 233 236 238 260   < > = values in this interval not displayed.     Basic Metabolic Panel: Recent Labs  Lab 09/25/22 0508 09/26/22 0413 09/26/22 1458 09/27/22 0459 09/27/22 1746 09/28/22 0456 09/29/22 0453  NA 136 136  --  135  --  135 136  K 4.6 4.8  --  4.1  --  4.0 3.9  CL 96* 98  --  100  --  100 98  CO2 26 24  --  26  --  26 27  GLUCOSE 140* 141*  --  138*  --  184* 172*  BUN 13 16  --  21*  --  27* 30*  CREATININE 1.14* 1.18*  --  0.92  --  1.02* 0.92  CALCIUM 8.9 8.6*  --  8.3*  --  8.6* 8.8*  MG 2.4 2.5* 2.4 2.3 2.3  --   --   PHOS 5.4*  --  5.2* 4.3 3.9  --   --     GFR: Estimated Creatinine Clearance: 76.4 mL/min (by C-G formula based on SCr of 0.92 mg/dL). Recent Labs  Lab 09/26/22 0413 09/27/22 0459 09/28/22 0456 09/29/22 0453  WBC 10.3 12.4* 13.6* 10.5     Liver Function Tests: No results for input(s): "AST", "ALT", "ALKPHOS", "BILITOT", "PROT", "ALBUMIN"  in the last 168 hours. No results for input(s): "LIPASE", "AMYLASE" in the last 168 hours. No results for input(s): "AMMONIA" in the last 168 hours.  ABG    Component Value Date/Time   PHART 7.451 (H) 09/17/2021 2204   PCO2ART 31.6 (L) 09/17/2021 2204   PO2ART 114 (H) 09/17/2021 2204   HCO3 28.1 (H) 09/22/2022 2128   TCO2 33 (H) 09/20/2021 0840   TCO2 32 09/20/2021 0840   ACIDBASEDEF 0.8 10/18/2021 0750   O2SAT 80.3 09/22/2022 2128     Coagulation Profile: Recent Labs  Lab 09/22/22 2134  INR 1.3*     Cardiac Enzymes: No results for input(s): "CKTOTAL", "CKMB", "CKMBINDEX", "TROPONINI" in the last 168 hours.  HbA1C: HbA1c, POC (controlled diabetic range)  Date/Time Value Ref Range Status  08/31/2021 11:20 AM 13.2 (A) 0.0 - 7.0 % Final  12/11/2019 10:39 AM 9.3 (A) 0.0 - 7.0 % Final   Hgb A1c MFr Bld  Date/Time Value Ref Range Status  09/24/2022 01:30 AM 10.8 (H) 4.8 - 5.6 % Final    Comment:    (NOTE)         Prediabetes: 5.7 - 6.4         Diabetes: >6.4          Glycemic control for adults with diabetes: <7.0   09/23/2022 12:18 PM 10.8 (H) 4.8 - 5.6 % Final    Comment:    (NOTE)         Prediabetes: 5.7 - 6.4         Diabetes: >6.4         Glycemic control for adults with diabetes: <7.0     CBG: Recent Labs  Lab 09/28/22 1524 09/28/22 1931 09/28/22 2317 09/29/22 0349 09/29/22 0733  GLUCAP 180* 189* 165* 183* 178*       Critical care time:      Otilio Carpen Araminta Zorn, PA-C [facility] See Amion for pager If no response to pager , please call [phone] until 7pm After 7:00 pm call Elink  [phone]?[place]

## 2022-09-29 NOTE — Progress Notes (Signed)
Occupational Therapy Treatment Patient Details Name: HLI RENT MRN: [id number] DOB: 02/28/1976 Today's Date: 09/29/2022   History of present illness 47 y.o. female admitted 2/29 for SOB related to acute on chronic CHF. 3/2 R MCA CVA with small L cerebellar CVA, NIHSS=16, to IR for thrombectomy but was unsuccessful, cardiac arrest during procedure with <1 minute of chest compressions, remained intubated post-procedure and self-extubated 3/5; PMH: CAD, DM2, HTN, a fib, asthma, anxiety, depression   OT comments  Pt in bed upon therapy arrival with Father visiting at bedside. Pt requesting multiple times during session she wanted the Cortrak out. Education provided as to why it could not be removed right now although pt continued to perseverate on wanting it removed. Session focused on bed mobility, sitting balance, and functional transfers. Pt with significant left lateral lean with decreased awareness. Able to assist with self correction when asked. Pt continues to be appropriate for skilled OT services. OT will continue to follow patient acutely.    Recommendations for follow up therapy are one component of a multi-disciplinary discharge planning process, led by the attending physician.  Recommendations may be updated based on patient status, additional functional criteria and insurance authorization.    Follow Up Recommendations  Acute inpatient rehab (3hours/day)     Assistance Recommended at Discharge Frequent or constant Supervision/Assistance  Patient can return home with the following  Two people to help with walking and/or transfers;Two people to help with bathing/dressing/bathroom;Help with stairs or ramp for entrance   Equipment Recommendations  Wheelchair (measurements OT);Wheelchair cushion (measurements OT);Hospital bed;Other (comment)       Precautions / Restrictions Precautions Precautions: Fall;Other (comment) Precaution Comments: left hemiparesis, cortrak Required Braces  or Orthoses: Other Brace Other Brace: Resting hand splint and Left PRAFO Restrictions Weight Bearing Restrictions: No       Mobility Bed Mobility Overal bed mobility: Needs Assistance   Rolling: +2 for physical assistance, Mod assist Sidelying to sit: Mod assist, +2 for physical assistance, HOB elevated       General bed mobility comments: roll to left using RUE to pull on rail; assist to move legs over EOB and more assist to elevate trunk from left sidelying Patient Response: Flat affect, Cooperative  Transfers Overall transfer level: Needs assistance Equipment used: 2 person hand held assist Transfers: Sit to/from Stand, Bed to chair/wheelchair/BSC Sit to Stand: Mod assist, +2 physical assistance Stand pivot transfers: Mod assist, +2 physical assistance         General transfer comment: left knee not buckling although hyperextended and closely guarded during standing and pivot; pt unable to advance either foot towards chair and pivoted on RLE; pt sat prematurely on edge of chair and required +2 assist to scoot hips back onto seat of chair     Balance Overall balance assessment: Needs assistance Sitting-balance support: Single extremity supported, Feet supported Sitting balance-Leahy Scale: Poor Sitting balance - Comments: mild left lean in sitting   Standing balance support: Bilateral upper extremity supported Standing balance-Leahy Scale: Poor Standing balance comment: also left lean     ADL either performed or assessed with clinical judgement    Extremity/Trunk Assessment Upper Extremity Assessment Upper Extremity Assessment: LUE deficits/detail RUE Deficits / Details: WFL A/ROM assessed in bed all ranges LUE Deficits / Details: No active movement present during session. Resting hand splint applied at end of session. Therapist will do skin check later today.  Cognition Arousal/Alertness: Awake/alert Behavior During Therapy: Flat  affect Overall Cognitive Status: Impaired/Different from baseline Area of Impairment: Orientation, Following commands, Safety/judgement, Awareness, Problem solving       Orientation Level: Disoriented to, Time     Following Commands: Follows one step commands with increased time Safety/Judgement: Decreased awareness of safety, Decreased awareness of deficits   Problem Solving: Slow processing, Decreased initiation, Difficulty sequencing, Requires verbal cues, Requires tactile cues General Comments: very slow to follow commands; stating repeatedly that she wants to go home with little awareness of her deficits              General Comments VSS on ICU monitors; on room air with sats 95% throughout    Pertinent Vitals/ Pain       Pain Assessment Pain Assessment: No/denies pain Faces Pain Scale: No hurt         Frequency  Min 2X/week        Progress Toward Goals  OT Goals(current goals can now be found in the care plan section)  Progress towards OT goals: Progressing toward goals     Plan Discharge plan remains appropriate;Frequency remains appropriate    Co-evaluation    PT/OT/SLP Co-Evaluation/Treatment: Yes Reason for Co-Treatment: Complexity of the patient's impairments (multi-system involvement);To address functional/ADL transfers;For patient/therapist safety;Necessary to address cognition/behavior during functional activity PT goals addressed during session: Mobility/safety with mobility;Balance OT goals addressed during session: ADL's and self-care;Proper use of Adaptive equipment and DME;Strengthening/ROM      AM-PAC OT "6 Clicks" Daily Activity     Outcome Measure   Help from another person eating meals?: Total Help from another person taking care of personal grooming?: Total Help from another person toileting, which includes using toliet, bedpan, or urinal?: Total Help from another person bathing (including washing, rinsing, drying)?: Total Help from  another person to put on and taking off regular upper body clothing?: Total Help from another person to put on and taking off regular lower body clothing?: Total 6 Click Score: 6    End of Session Equipment Utilized During Treatment: Oxygen;Gait belt  OT Visit Diagnosis: Unsteadiness on feet (R26.81);Muscle weakness (generalized) (M62.81);Hemiplegia and hemiparesis Hemiplegia - Right/Left: Left Hemiplegia - dominant/non-dominant: Non-Dominant Hemiplegia - caused by: Cerebral infarction   Activity Tolerance Patient tolerated treatment well   Patient Left in bed;with call bell/phone within reach;with bed alarm set;with family/visitor present;with SCD's reapplied;with restraints reapplied   Nurse Communication Mobility status;Need for lift equipment;Precautions        Time: 1006-1036 OT Time Calculation (min): 30 min  Charges: OT General Charges $OT Visit: 1 Visit OT Treatments $Therapeutic Activity: 8-22 mins  Ailene Ravel, OTR/L,CBIS  Supplemental OT - [place] and [facility] Secure Chat Preferred    Ashante Yellin, Clarene Duke 09/29/2022, 12:48 PM

## 2022-09-29 NOTE — Progress Notes (Signed)
STROKE TEAM PROGRESS NOTE   INTERVAL HISTORY Her father is at the bedside. Pt reclining in bed, still lethargic but AAO x 3, speaking in much louder voice, wanted to go home. Stated that she was on coumadin but was discontinued by physician. Pharmacy recorder showed she was noncompliant with coumadin and missing INR checks.    Vitals:   09/29/22 0800 09/29/22 0806 09/29/22 0900 09/29/22 1000  BP: (!) 134/103  (!) 135/105 (!) 124/96  Pulse: 80  80 81  Resp: '13  14 13  '$ Temp:  97.9 F (36.6 C)    TempSrc:  Oral    SpO2: 94%  97% 98%  Weight:      Height:       CBC:  Recent Labs  Lab 09/22/22 2050 09/23/22 0514 09/28/22 0456 09/29/22 0453  WBC 8.7   < > 13.6* 10.5  NEUTROABS 6.0  --   --   --   HGB 13.0   < > 12.1 11.6*  HCT 41.3   < > 38.5 37.6  MCV 71.1*   < > 72.1* 72.7*  PLT 232   < > 238 260   < > = values in this interval not displayed.   Basic Metabolic Panel:  Recent Labs  Lab 09/27/22 0459 09/27/22 1746 09/28/22 0456 09/29/22 0453  NA 135  --  135 136  K 4.1  --  4.0 3.9  CL 100  --  100 98  CO2 26  --  26 27  GLUCOSE 138*  --  184* 172*  BUN 21*  --  27* 30*  CREATININE 0.92  --  1.02* 0.92  CALCIUM 8.3*  --  8.6* 8.8*  MG 2.3 2.3  --   --   PHOS 4.3 3.9  --   --    Lipid Panel:  Recent Labs  Lab 09/24/22 0130 09/25/22 0508 09/28/22 0456  CHOL 136  --   --   TRIG 62   < > 63  HDL 27*  --   --   CHOLHDL 5.0  --   --   VLDL 12  --   --   LDLCALC 97  --   --    < > = values in this interval not displayed.   HgbA1c:  Recent Labs  Lab 09/24/22 0130  HGBA1C 10.8*   Urine Drug Screen: No results for input(s): "LABOPIA", "COCAINSCRNUR", "LABBENZ", "AMPHETMU", "THCU", "LABBARB" in the last 168 hours.  Alcohol Level No results for input(s): "ETH" in the last 168 hours.  IMAGING past 24 hours No results found.  PHYSICAL EXAM  Temp:  [97.8 F (36.6 C)-98.7 F (37.1 C)] 97.9 F (36.6 C) (03/07 0806) Pulse Rate:  [72-91] 81 (03/07  1000) Resp:  [0-21] 13 (03/07 1000) BP: (99-140)/(66-111) 124/96 (03/07 1000) SpO2:  [94 %-99 %] 98 % (03/07 1000) Weight:  [90.3 kg] 90.3 kg (03/07 0416)   General - Well nourished, well developed, lethargic but not in distress.   Ophthalmologic - fundi not visualized due to noncooperation.   Cardiovascular - Regular rate and rhythm, not in afib.   Neuro - lethargic but continue to improve, awake alert, eyes open, following simple commands. Voice louder and breathing without difficulty. Eyes right gaze preference, barely cross midline, blinking to visual threat on the right but not on the left. PERRL. Left facial droop and tongue protrusion midline. RUE 3/5. LUE flaccid. RLE withdraw to pain. LLE in boot, flicker movement with painful stimuli. Sensation, coordination not  very cooperative and gait not tested.     ASSESSMENT/PLAN Ms. BRITTNIE BENWAY is a 47 y.o. female with extensive PMH that includes but not limited to Acute combined systolic and diastolic congestive heart failure, history of LV thrombus, prior NSTEMMI, prior PE, A-fib, type 2 diabetes, HTN, depression, and OSA who presented 3/1 with 2 week history of lower extremity edema, shortness of breath, and chest pain.  Neurology was consulted for code stroke 3/2 due to left-sided weakness.  Patient underwent IR procedure due to right M2 occlusion she remained intubated postprocedure.  Stroke workup as detailed below.  3/3 MRI negative for hemorrhage, will restart heparin drip.  Will plan for SBT and hopeful extubation early tomorrow a.m.  Appreciate CCM assistance with this.  Stroke - right MCA infarct with right M2 occlusion, s/p IR with TICI2b, etiology likely embolic from known LV thrombus, cardiomyopathy with low EF and PAF even on heparin IV Code Stroke CT head: Loss of gray-white differentiation in the insula likely reflecting early infarct.  Aspects is 9 CTA head & neck:  Proximal right M2 occlusion with minimal collateral flow  throughout the MCA distribution. IR - Occluded inferior division of the right middle cerebral artery in the mid M2 segment. TICI 2B revascularization.  Post IR CT: no evidence of intracranial hemorrhage.  MRI: Large area of acute infarct in the right MCA distribution, smaller area of acute infarct in the right ACA distribution, and punctate acute infarct in the left cerebellar hemisphere. No evidence of hemorrhagic transformation or midline shift. MRA head - Persistent occlusion of a right M2 branch with minimal collateral flow in the MCA distribution aside from in the region of the frontal operculum. CT repeat 3/4 stable right superior division MCA territory and distal right ACA territory infarcts with petechial hemorrhage. 2D Echo: Large LV apical thrombus present, LVEF 15% LDL 97 HgbA1c 10.8 VTE prophylaxis - SCDs, Heparin IV  warfarin daily prior to admission (?compliance), now on heparin IV. Given hx of noncompliance with coumadin and INR check, will switch to eliquis once ready  Therapy recommendations:  CIR Disposition:  pending  Brief Cardiac Arrest during IR procedure 75mns of ACLS before ROSC achieved Cardiology/CCM on board  Acute Respiratory Failure Self extubated 3/5 Tolerating well so far Close monitoring Appreciate CCM assistance Leukocytosis WBC 10.3->12.4->13.6 CXR L>R pleural effusion and new left lung base hazy opacity Afebrile   Acute on chronic combined congestive heart failure Nonischemic cardiomyopathy LV thrombus 2D Echo: Large LV apical thrombus present, LVEF 15% Subtherapeutic INR on admission Resumed Heparin gtt, will switch to eliquis once appropriate per cardiology Cardiology on board R/L cath 08/2021 showed mild, nonobstructive CAD CXR with cardiomegaly and mild pulmonary vascular congestion. Strict Intake/Output, Foley catheter, daily weights Resume Toprol and spironolactone 3/4 Resume Losartan '50mg'$  on 3/6 Resume Jardiance today after passing  swallow  Paroxysmal Atrial Fibrillation Resumed Heparin gtt  Continuous telemetry monitoring Consider eliquis once appropriate for po AC.   Hx of hypertension hypotension Stable on the low end Off Neo Resumed Toprol and spironolactone and cozaar  Also on torsemid 40 BP goal 120-180 Long-term BP goal normotensive  Hyperlipidemia Home meds:  none LDL 97, goal < 70 on lipitor '80mg'$   Continue statin at discharge  Diabetes type II Uncontrolled Home meds:  novolog flexpen, and Jardiance HgbA1c 10.8, goal < 7.0 CBG  SSI Resume jardiance today Close PCP follow up for better DM control  Dysphagia  Passed swallow Will d/c cortrak and TF if eating well today  Speech on board  Other Stroke Risk Factors Obesity, Body mass index is 40.19 kg/m., BMI >/= 30 associated with increased stroke risk, recommend weight loss, diet and exercise as appropriate  Coronary artery disease / NSTEMI Migraines Obstructive sleep apnea  Other Active Problems Hypokalemia, resolved. Goal >4 Hypomagnesia, resolved. [place] >2.  AKI Cre 1.04->1.14->1.18->0.92->1.02->0.92 Leukocytosis WBC 10.3->12.4->13.6->10.5, UA WBC 11-20  Hospital day # 6  This patient is critically ill due to right MCA stroke, status post mechanical thrombectomy, LV thrombus, A-fib, cardiomyopathy, respiratory failure and at significant risk of neurological worsening, death form recurrent stroke, hemorrhagic transformation, heart failure, arrhythmia, seizure. This patient's care requires constant monitoring of vital signs, hemodynamics, respiratory and cardiac monitoring, review of multiple databases, neurological assessment, discussion with family, other specialists and medical decision making of high complexity. I spent 35 minutes of neurocritical care time in the care of this patient. I had long discussion with father at bedside, updated pt current condition, treatment plan and potential prognosis, and answered all the questions. He  expressed understanding and appreciation.     Rosalin Hawking, MD PhD Stroke Neurology 09/29/2022 10:12 AM     To contact Stroke Continuity provider, please refer to [email or web address] After hours, contact General Neurology

## 2022-09-29 NOTE — Patient Instructions (Signed)
OT NOTE  RN STAFF  Please check splint every 4 hours during shift ( remove splint ) to assess for: * pain * redness *swelling *skin break down  If any symptoms above are present remove splint for 15 minutes. If symptoms continue - keep the splint removed and notify OT staff [phone] immediately.   Keep the splinted upper extremity elevated at all times on pillows / towels.  Splint can be cleaned with warm soapy water . Splint should not come in contact with any type of heat because the splint will mold into a new shape.   Splints are to be worn for 4 hours and off for 2 hours  Examples of schedule:  Splints off at 6am Splints on at 8 am Splints off at 12 pm Splints on at 2 pm Splints off at 6pm Splints on at 8 pm    To place the splint on:  1. Place the wrist in position first and secure strap 2. Position each digit and apply strap 3. The thumb and forearm strap should be applied last    The splints should fit as appeared here Strap over the PIP joint of finger Strap over the MCP ( knuckles)  joints of the hand Strap at the thumb Strap at the wrist Strap at the forearm

## 2022-09-29 NOTE — PMR Pre-admission (Signed)
PMR Admission Coordinator Pre-Admission Assessment  Patient: [name] is an 47 y.o., female MRN: [id number] DOB: Aug 11, 1975 Height: 4' 11.02" (149.9 cm) Weight: 90.3 kg  Insurance Information HMO:     PPO:      PCP:      IPA:      80/20:      OTHER:  PRIMARY: Medicare a and b      Policy#: [id number]      Subscriber: pt Benefits:  Phone #: passport one source online     Name: 3/6 Eff. Date: 03/25/2022     Deduct: $1632      Out of Pocket Max: none      Life Max: none CIR: 100%      SNF: 20 full days Outpatient: 80%     Co-Pay: 20% Home Health: 100%      Co-Pay: none DME: 80%     Co-Pay: 20% Providers: pt choice  SECONDARY: Medicaid [place] [employer]      Policy#: [id number] b 3/1 MADQY  Financial Counselor:       Phone#:   The "Data Collection Information Summary" for patients in Inpatient Rehabilitation Facilities with attached "Privacy Act [place] Records" was provided and verbally reviewed with: Patient and Family  Emergency Contact Information Contact Information     Name Relation Home Work Mobile   [place] Daughter   [phone]   Tanell, [place] Daughter   [phone]   Jolyne Loa   [phone]   Aysel, [place] Father   [phone]      Current Medical History  Patient Admitting Diagnosis: CVA  History of Present Illness: 46 year old female who presented on 09/22/22 with SOB secondary to combined CHF exacerbation.  Past medical history of acute combined systolic and diastolic CHF, LV thrombus, prior NSTEMMI, PE, afib , type 2 DM< HTN, depression and OSA. Reports 2 week history of LE edema, SOB and chest pain.   Neurology consult on 3/2 for code stroke due to left sided weakness. She underwent IR procedure due to right M2 occlusion. She was subtherapeutic INR of 1.3. CTA head revealed likely evolving CVA with right M2 occulusion with minimal collateral flow throughout the MCA distribution. On induction for thrombectomy, patient lost BP followed  by loss of pulse. Remained intubated postprocedure. Heparin gtt began. Self extubated on 3/5. 2 D echo with large LV apical thrombus, LVEF 15 %. Was on Warfarin pta. Found to be noncompliant with coumadin an INR checks. Will switch to Eliquis form heparin Gtt. To resume Toprol and spironolactone and Losartan. To resume Jardiance when taking po. LDL 97. Placed on Lipitor. Hgb A1c 10.8. home meds on Novolog flex pen and Jardiance.   Complete NIHSS TOTAL: 13  Patient[facility] has been reviewed by the rehabilitation admission coordinator and physician.  Past Medical History  Past Medical History:  Diagnosis Date   Abnormal Pap smear    Acute combined systolic and diastolic heart failure ([place]) 02/19/2019   Wt Readings from Last 3 Encounters:  09/18/21  84.7 kg  09/02/21  84.4 kg  08/31/21  84.2 kg         Acute heart failure ([place]) 01/29/2022   Acute on chronic combined systolic and diastolic CHF (congestive heart failure) ([place]) 02/19/2019   Acute on chronic congestive heart failure (HCC)    Acute on chronic congestive heart failure (HCC)    Acute on chronic diastolic CHF (congestive heart failure) ([place]) 10/19/2021   Acute on chronic systolic heart failure ([place])  02/22/2022   Anemia    Asthma    Atrial fibrillation (HCC)    history of paroxysmal atrial fibrillation   CHF (congestive heart failure) ([place]) 11/02/2021   CHF exacerbation ([place]) 10/17/2021   Diabetes mellitus    DKA, type 2 ([place]) 02/06/2019   Elevated troponin 09/30/2019   Heart failure with reduced ejection fraction ([place])    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  Consider chronic etiology given continued fertility issues   Hypertension    Hypertensive emergency 09/30/2019   Hypertensive urgency 06/02/2022   Major depressive disorder 02/08/2012   Reports that her infertility is a major cause of her depression Reports Prozac has  worked in the past for her.     Migraines    Morbid obesity ([place]) 02/06/2019   Nausea & vomiting 06/26/2022   Nonischemic cardiomyopathy ([place]) 09/22/2021   NSTEMI (non-ST elevated myocardial infarction) ([place]) 02/23/2022   Obstructive sleep apnea 06/30/2015   Polycystic ovarian disease    Has the patient had major surgery during 100 days prior to admission? Yes  Family History   family history includes Diabetes in her father and mother; Hypertension in her father and mother.  Current Medications  Current Facility-Administered Medications:    acetaminophen (TYLENOL) tablet 650 mg, 650 mg, Oral, Q4H PRN, 650 mg at 09/30/22 1157 **OR** acetaminophen (TYLENOL) 160 MG/5ML solution 650 mg, 650 mg, Per Tube, Q4H PRN, 650 mg at 09/29/22 1327 **OR** acetaminophen (TYLENOL) suppository 650 mg, 650 mg, Rectal, Q4H PRN, Deveshwar, Sanjeev, MD   apixaban (ELIQUIS) tablet 5 mg, 5 mg, Oral, BID, Rosalin Hawking, MD, 5 mg at 09/30/22 0929   atorvastatin (LIPITOR) tablet 80 mg, 80 mg, Oral, QHS, Rosalin Hawking, MD, 80 mg at 09/29/22 2150   Chlorhexidine Gluconate Cloth 2 % PADS 6 each, 6 each, Topical, Q0600, Freddi Starr, MD, 6 each at 09/30/22 0145   docusate (COLACE) 50 MG/5ML liquid 100 mg, 100 mg, Oral, BID, Rosalin Hawking, MD, 100 mg at 09/30/22 [id number]   empagliflozin (JARDIANCE) tablet 10 mg, 10 mg, Oral, Daily, Rosalin Hawking, MD, 10 mg at 09/30/22 0929   insulin aspart (novoLOG) injection 0-15 Units, 0-15 Units, Subcutaneous, TID WC, Wilson Singer I, RPH, 3 Units at 09/30/22 1251   insulin aspart (novoLOG) injection 0-5 Units, 0-5 Units, Subcutaneous, QHS, Mitchell, Madeline I, RPH   losartan (COZAAR) tablet 50 mg, 50 mg, Oral, Daily, Rosalin Hawking, MD, 50 mg at 09/30/22 0929   metoprolol tartrate (LOPRESSOR) tablet 25 mg, 25 mg, Oral, BID, Rosalin Hawking, MD, 25 mg at 09/30/22 [id number]   multivitamin with minerals tablet 1 tablet, 1 tablet, Oral, Daily, Rosalin Hawking, MD, 1 tablet at 09/30/22 0929   ondansetron  (ZOFRAN) injection 4 mg, 4 mg, Intravenous, Q6H PRN, Collene Gobble, MD, 4 mg at 09/30/22 1533   Oral care mouth rinse, 15 mL, Mouth Rinse, PRN, Freddi Starr, MD   Oral care mouth rinse, 15 mL, Mouth Rinse, Q4H, Rosalin Hawking, MD, 15 mL at 09/30/22 1544   oxyCODONE (Oxy IR/ROXICODONE) immediate release tablet 5-10 mg, 5-10 mg, Oral, Q4H PRN, Rosalin Hawking, MD, 10 mg at 09/30/22 1511   pantoprazole (PROTONIX) injection 40 mg, 40 mg, Intravenous, QHS, Freda Jackson B, MD, 40 mg at 09/29/22 2150   phenol (CHLORASEPTIC) mouth spray 1 spray, 1 spray, Mouth/Throat, PRN, Amie Portland, MD, 1 spray at 09/29/22 0233   polyethylene glycol (MIRALAX / GLYCOLAX) packet 17 g,  17 g, Oral, Daily, Rosalin Hawking, MD   spironolactone (ALDACTONE) tablet 25 mg, 25 mg, Oral, Daily, Rosalin Hawking, MD, 25 mg at 09/30/22 [id number]   torsemide (DEMADEX) tablet 40 mg, 40 mg, Oral, Daily, Rosalin Hawking, MD, 40 mg at 09/30/22 [id number]  Patients Current Diet:  Diet Order             DIET DYS 3 Room service appropriate? Yes with Assist; Fluid consistency: Thin  Diet effective now                  Precautions / Restrictions Precautions Precautions: Fall, Other (comment) Precaution Comments: left hemiparesis, cortrak Other Brace: Resting hand splint and Left PRAFO Restrictions Weight Bearing Restrictions: No   Has the patient had 2 or more falls or a fall with injury in the past year? No  Prior Activity Level Community (5-7x/wk): Independent without AD, does not drive  Prior Functional Level Self Care: Did the patient need help bathing, dressing, using the toilet or eating? Independent  Indoor Mobility: Did the patient need assistance with walking from room to room (with or without device)? Independent  Stairs: Did the patient need assistance with internal or external stairs (with or without device)? Independent  Functional Cognition: Did the patient need help planning regular tasks such as shopping or remembering  to take medications? Independent  Patient Information Are you of Hispanic, Latino/a,or Spanish origin?: A. No, not of Hispanic, Latino/a, or Spanish origin What is your race?: B. Black or African American Do you need or want an interpreter to communicate with a doctor or health care staff?: 0. No  Patient's Response To:  Health Literacy and Transportation Is the patient able to respond to health literacy and transportation needs?: Yes Health Literacy - How often do you need to have someone help you when you read instructions, pamphlets, or other written material from your doctor or pharmacy?: Never In the past 12 months, has lack of transportation kept you from medical appointments or from getting medications?: No In the past 12 months, has lack of transportation kept you from meetings, work, or from getting things needed for daily living?: No  Development worker, international aid / [place] Devices/Equipment: None Home Equipment: Hand held shower head  Prior Device Use: Indicate devices/aids used by the patient prior to current illness, exacerbation or injury? None of the above  Current Functional Level Cognition  Arousal/Alertness: Awake/alert Overall Cognitive Status: Impaired/Different from baseline Difficult to assess due to: Impaired communication, Level of arousal Orientation Level: Oriented X4 Following Commands: Follows one step commands with increased time Safety/Judgement: Decreased awareness of safety, Decreased awareness of deficits General Comments: slow to follow commands, able to express needs and remember family phone numbers. seems unaware of errors with use of phone Attention: Focused, Sustained Focused Attention: Appears intact Sustained Attention: Impaired Sustained Attention Impairment: Verbal basic Memory: Impaired Memory Impairment: Retrieval deficit (Immediate: 5/5 with repetition x2; delayed: 1/5; with cues: 1/4) Awareness: Impaired Awareness  Impairment: Intellectual impairment Problem Solving: Impaired Problem Solving Impairment: Verbal basic    Extremity Assessment (includes Sensation/Coordination)  Upper Extremity Assessment: LUE deficits/detail RUE Deficits / Details: WFL A/ROM assessed in bed all ranges LUE Deficits / Details: No active movement present during session. Resting hand splint applied at end of session. Therapist will do skin check later today. LUE Sensation: decreased light touch LUE Coordination: decreased fine motor, decreased gross motor  Lower Extremity Assessment: Defer to PT evaluation RLE Deficits / Details: AAROM WFL; hip flexion 3,  extension 3+, knee extension 3+, RLE Coordination: WNL LLE Deficits / Details: PROM WFL; moves 1st toe to painful stimulus at 2nd toe, no other active movement noted    ADLs  Overall ADL's : Needs assistance/impaired Eating/Feeding: Independent Grooming: Wash/dry hands, Wash/dry face, Supervision/safety, Standing Upper Body Bathing: Supervision/ safety, Set up Lower Body Bathing: Supervison/ safety, Set up Upper Body Dressing : Set up Lower Body Dressing: Set up Toilet Transfer: Supervision/safety, Ambulation, Comfort height toilet Toileting- Clothing Manipulation and Hygiene: Supervision/safety Functional mobility during ADLs: Supervision/safety General ADL Comments: total (A) for all care at this time    Mobility  Overal bed mobility: Needs Assistance Bed Mobility: Rolling, Sidelying to Sit Rolling: Mod assist Sidelying to sit: Mod assist General bed mobility comments: modA to roll to R and modA to elevate trunk to sitting. mod A to scoot to EOB and then pt needing support to maintain static sitting balance (leaning forwards and to L)    Transfers  Overall transfer level: Needs assistance Equipment used: 2 person hand held assist Transfers: Sit to/from Stand, Bed to chair/wheelchair/BSC Sit to Stand: Mod assist, +2 physical assistance Bed to/from  chair/wheelchair/BSC transfer type:: Stand pivot Stand pivot transfers: Mod assist, +2 physical assistance General transfer comment: modA to rise to standing with assist to block L knee and extend at hips. pt completed x5 in session. cues for wt shift to R. unable to clear feet for steps to recliner    Ambulation / Gait / Stairs / Wheelchair Mobility  Ambulation/Gait Ambulation/Gait assistance: Counsellor (Feet): 30 Feet Assistive device: None Gait Pattern/deviations: Step-through pattern, Decreased stride length General Gait Details: unable Gait velocity: decreased Gait velocity interpretation: <1.31 ft/sec, indicative of household ambulator Pre-gait activities: standing wt shift x 10    Posture / Balance Dynamic Sitting Balance Sitting balance - Comments: mild left lean in sitting Balance Overall balance assessment: Needs assistance Sitting-balance support: Single extremity supported, Feet supported Sitting balance-Leahy Scale: Poor Sitting balance - Comments: mild left lean in sitting Standing balance support: Bilateral upper extremity supported Standing balance-Leahy Scale: Poor Standing balance comment: also left lean, able to manage wt shift to R with direct cues but unable to maintain    Special needs/care consideration OSA but does not use home CPAP Hb A1c 10.8 Noncompliant with coumadin and INR checks per MD pta 10 fr feeding tube 64 cm placed on 3/4    Previous Home Environment  Living Arrangements: Alone  Lives With: Alone Available Help at Discharge: Family, Friend(s), Available 24 hours/day (daughter, dtr's partner, second dtr, and family) Type of Home: Apartment Home Layout: Two level, 1/2 bath on main level, Bed/bath upstairs Alternate Level Stairs-Rails: Right Alternate Level Stairs-Number of Steps: flight Home Access: Stairs to enter Technical brewer of Steps: 1 Bathroom Shower/Tub: Journalist, newspaper: Yes Home Care Services: No Additional Comments: in the process of getting a handicapped accessible apartment  Discharge Living Setting Plans for Discharge Living Setting: Lives with (comment) (will move in with dtr, Kennita's home) Type of Home at Discharge: House Discharge Home Layout: Two level, Able to live on main level with bedroom/bathroom Discharge Home Access: Stairs to enter Entrance Stairs-Rails: None Entrance Stairs-Number of Steps: 2-3 steps Discharge Bathroom Shower/Tub: Tub/shower unit Discharge Bathroom Toilet: Standard Discharge Bathroom Accessibility: Yes How Accessible: Accessible via walker Does the patient have any problems obtaining your medications?: No  Kennita's address is [place], [place] ( ? Public housing)  Radiographer, therapeutic Patient  Roles: Parent, Other (Comment) (employee) Contact Information: daughter, Leroy Kennedy Anticipated Caregiver: 2 daughters, Kennita's partner, pt's Dad and family Anticipated Caregiver's Contact Information: see contacts Ability/Limitations of Caregiver: family works but will arrange coverage 24/7 Caregiver Availability: 24/7 Discharge Plan Discussed with Primary Caregiver: Yes Is Caregiver In Agreement with Plan?: Yes Does Caregiver/Family have Issues with Lodging/Transportation while Pt is in Rehab?: No  Goals Patient/Family Goal for Rehab: min assist with PT , OT and SLP. Aware may be at wheelchair level Expected length of stay: ELOS 14 to 20 days Additional Information: Pt's boyfriend, Corene Cornea, is also involved Pt/Family Agrees to Admission and willing to participate: Yes Program Orientation Provided & Reviewed with Pt/Caregiver Including Roles  & Responsibilities: Yes  Leroy Kennedy is aware likely will be wheelchair level goals  Decrease burden of Care through IP rehab admission: n/a  Possible need for SNF placement upon discharge: not anticipated  Patient Condition: I have reviewed medical records  from [facility], spoken with CM, and patient, daughter, and family member. I met with patient at the bedside for inpatient rehabilitation assessment.  Patient will benefit from ongoing PT, OT, and SLP, can actively participate in 3 hours of therapy a day 5 days of the week, and can make measurable gains during the admission.  Patient will also benefit from the coordinated team approach during an Inpatient Acute Rehabilitation admission.  The patient will receive intensive therapy as well as Rehabilitation physician, nursing, social worker, and care management interventions.  Due to bladder management, bowel management, safety, skin/wound care, disease management, medication administration, pain management, and patient education the patient requires 24 hour a day rehabilitation nursing.  The patient is currently mod ot max asisst with mobility and basic ADLs.  Discharge setting and therapy post discharge at home with home health is anticipated.  Patient has agreed to participate in the Acute Inpatient Rehabilitation Program and will admit Sunday 3/10.  Preadmission Screen Completed By:  Cleatrice Burke, RN MSN 09/30/2022 4:42 PM ______________________________________________________________________   Discussed status with Dr. Naaman Plummer on 09/30/22 at 1500 and received approval for admission 3/10 when bed is available.  Admission Coordinator:  Cleatrice Burke, RN MSN time 1500 Date 09/30/22   Assessment/Plan: Diagnosis: Does the need for close, 24 hr/day Medical supervision in concert with the patient's rehab needs make it unreasonable for this patient to be served in a less intensive setting? {yes_no_potentially:3041433} Co-Morbidities requiring supervision/potential complications: *** Due to {due WC:4653188, does the patient require 24 hr/day rehab nursing? {yes_no_potentially:3041433} Does the patient require coordinated care of a physician, rehab nurse, PT, OT, and SLP to address  physical and functional deficits in the context of the above medical diagnosis(es)? {yes_no_potentially:3041433} Addressing deficits in the following areas: {deficits:3041436} Can the patient actively participate in an intensive therapy program of at least 3 hrs of therapy 5 days a week? {yes_no_potentially:3041433} The potential for patient to make measurable gains while on inpatient rehab is {potential:3041437} Anticipated functional outcomes upon discharge from inpatient rehab: {functional outcomes:304600100} PT, {functional outcomes:304600100} OT, {functional outcomes:304600100} SLP Estimated rehab length of stay to reach the above functional goals is: *** Anticipated discharge destination: {anticipated dc setting:21604} 10. Overall Rehab/Functional Prognosis: {potential:3041437}   MD Signature: ***

## 2022-09-29 NOTE — Progress Notes (Signed)
Inpatient Rehabilitation Admissions Coordinator   I met with patient and her Dad at bedside. We discussed goals of CIR admit. I am hopeful to admit to CIR when medically ready. They are in agreement. She will discharge home with daughter and family will provide 24/7 physical care after CIR admit.  Danne Baxter, RN, MSN Rehab Admissions Coordinator [phone] 09/29/2022 12:36 PM

## 2022-09-29 NOTE — Progress Notes (Signed)
Speech Language Pathology Treatment: Dysphagia  Patient Details Name: [name] MRN: [id number] DOB: Jun 11, 1976 Today's Date: 09/29/2022 Time: [id number] SLP Time Calculation (min) (ACUTE ONLY): 20 min  Assessment / Plan / Recommendation Clinical Impression  Pt was seen for dysphagia treatment with her father present. Pt was alert and cooperative during the session, but still with reduced intellectual and online awareness. Pt reported that she intends to return home and that she does not foresee any challenges with mobility. Pt demonstrated improvement in oropharyngeal swallow function. Mastication was improved, but inconsistent and pt frequently required prompts to continue mastication of the entire bolus. Mild oral residue was noted, but improved with liquid washes. No s/s of aspiration were noted with solids, but pt demonstrated inconsistent coughing with dual consistency boluses, intermittently with thin liquids via straw, and once with 11 consecutive swallows of thin liquids via cup. A dysphagia 2 diet with thin liquids is recommended at this time with observance of swallowing precautions. SLP will continue to follow pt.     HPI HPI: Pt is a 47 y.o. female who presented 2/29 for SOB related to acute on chronic CHF. MRI brain 3/3: Large area of acute infarct in the right MCA distribution, smaller area of acute infarct in the right ACA distribution, and  punctate acute infarct in the left cerebellar hemisphere. Persistent occlusion of a right M2 branch with minimal collateral flow in the MCA distribution aside from in the region of the frontal operculum. IR thrombectomy attempted 3/2, but was unsuccessful due to cardiac arrest during procedure with <1 minute of chest compressions.. Pt remained  remained intubated post-procedure. ETT 3/2-3/5 (self-extubation). Pt failed the [place] 3/6 due to coughing. PMH: CAD, DM2, HTN, a fib, asthma, anxiety, depression      SLP Plan  Continue with current plan of  care      Recommendations for follow up therapy are one component of a multi-disciplinary discharge planning process, led by the attending physician.  Recommendations may be updated based on patient status, additional functional criteria and insurance authorization.    Recommendations  Diet recommendations: Dysphagia 2 (fine chop);Thin liquid Liquids provided via: Cup;No straw Medication Administration: Via alternative means (or crushed with puree) Supervision: Staff to assist with self feeding Compensations: Small sips/bites;Slow rate Postural Changes and/or Swallow Maneuvers: Seated upright 90 degrees                Oral Care Recommendations: Oral care QID;Oral care prior to ice chip/H20 Follow Up Recommendations: Acute inpatient rehab (3hours/day) Assistance recommended at discharge: Frequent or constant Supervision/Assistance SLP Visit Diagnosis: Dysphagia, unspecified (R13.10) Plan: Continue with current plan of care         Charlot Gouin I. [name], [place], [place] Office number [phone]   [name]  09/29/2022, 10:16 AM

## 2022-09-29 NOTE — Progress Notes (Signed)
ANTICOAGULATION CONSULT NOTE -   Pharmacy Consult for Heparin Indication:  LV Thrombus  Allergies  Allergen Reactions   Fish Allergy Swelling   Shellfish Allergy Anaphylaxis, Swelling and Other (See Comments)    Airway involvement including EMS - Has Epi-Pen   Metformin And Related Diarrhea   Trulicity [Dulaglutide] Nausea And Vomiting and Other (See Comments)    Multiple episodes of vomiting over multiple days   Nsaids Other (See Comments)    Per PCP interferes with daily meds (heart failure)   Advil [Ibuprofen] Other (See Comments)    Per PCP interferes with daily meds (heart failure)    Patient Measurements: Height: 4' 11.02" (149.9 cm) Weight: 90.3 kg (199 lb 1.2 oz) IBW/kg (Calculated) : 43.24 Heparin Dosing Weight: 65.6 kg  Vital Signs: Temp: 98.1 F (36.7 C) (03/07 0400) Temp Source: Axillary (03/07 0400) BP: 125/99 (03/07 0600) Pulse Rate: 83 (03/07 0600)  Labs: Recent Labs    09/27/22 0459 09/28/22 0456 09/29/22 0453  HGB 11.9* 12.1 11.6*  HCT 36.0 38.5 37.6  PLT 236 238 260  HEPARINUNFRC 0.42 0.34 0.34  CREATININE 0.92 1.02* 0.92     Estimated Creatinine Clearance: 76.4 mL/min (by C-G formula based on SCr of 0.92 mg/dL).   Medical History: Past Medical History:  Diagnosis Date   Abnormal Pap smear    Acute combined systolic and diastolic heart failure ([place]) 02/19/2019   Wt Readings from Last 3 Encounters:  09/18/21  84.7 kg  09/02/21  84.4 kg  08/31/21  84.2 kg         Acute heart failure ([place]) 01/29/2022   Acute on chronic combined systolic and diastolic CHF (congestive heart failure) ([place]) 02/19/2019   Acute on chronic congestive heart failure (HCC)    Acute on chronic congestive heart failure (HCC)    Acute on chronic diastolic CHF (congestive heart failure) ([place]) 10/19/2021   Acute on chronic systolic heart failure ([place]) 02/22/2022   Anemia    Asthma    Atrial fibrillation ([place])    history of paroxysmal atrial fibrillation   CHF  (congestive heart failure) ([place]) 11/02/2021   CHF exacerbation ([address]) 10/17/2021   Diabetes mellitus    DKA, type 2 ([place]) 02/06/2019   Elevated troponin 09/30/2019   Heart failure with reduced ejection fraction ([place])    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     Migraines    Morbid obesity ([place]) 02/06/2019   Nausea & vomiting 06/26/2022   Nonischemic cardiomyopathy ([place]) 09/22/2021   NSTEMI (non-ST elevated myocardial infarction) ([place]) 02/23/2022   Obstructive sleep apnea 06/30/2015   Polycystic ovarian disease     Assessment:  47 y.o. 2 week history of lower extremity edema, shortness of breath, and chest pain. Patient developed stroke as inpatient. Now found to have an LV Thrombus.  Pharmacy consulted to start heparin infusion for LV thrombus using the stroke protocol (no bolus).  Patient on warfarin PTA.  HL 0.34 on 950 units/h - therapeutic. CBC stable   Goal of Therapy:  Heparin level 0.3-0.5 units/ml Monitor platelets by anticoagulation protocol: Yes   Plan:  Continue heparin infusion at 950 units/hr  Check anti-Xa level daily while on heparin Continue to monitor H&H and platelets F.u transition to enteral options  Wilson Singer, PharmD Clinical Pharmacist 09/29/2022 7:09 AM

## 2022-09-29 NOTE — Progress Notes (Signed)
Physical Therapy Treatment Patient Details Name: [name] MRN: [id number] DOB: [birth year] Today's Date: 09/29/2022   History of Present Illness 47 y.o. female admitted 2/29 for SOB related to acute on chronic CHF. 3/2 R MCA CVA with small L cerebellar CVA, NIHSS=16, to IR for thrombectomy but was unsuccessful, cardiac arrest during procedure with <1 minute of chest compressions, remained intubated post-procedure and self-extubated 3/5; PMH: CAD, DM2, HTN, a fib, asthma, anxiety, depression    PT Comments    Patient much more alert and conversant today. Asking repeatedly for cortrak to be removed and stating she wants to go home. Able to progress to OOB with stand-pivot towards her right with +2 mod assist. Patient's short stature makes it difficulty for her to fully scoot back in chair, but pt was activating Left lower torso in effort to move left side backward.     Recommendations for follow up therapy are one component of a multi-disciplinary discharge planning process, led by the attending physician.  Recommendations may be updated based on patient status, additional functional criteria and insurance authorization.  Follow Up Recommendations  Acute inpatient rehab (3hours/day)     Assistance Recommended at Discharge Frequent or constant Supervision/Assistance  Patient can return home with the following Other (comment) (cannot currently be cared for at home)   Equipment Recommendations  Wheelchair (measurements PT);Wheelchair cushion (measurements PT);Hospital bed    Recommendations for Other Services Rehab consult     Precautions / Restrictions Precautions Precautions: Fall;Other (comment) Precaution Comments: left hemiparesis, cortrak Required Braces or Orthoses: Other Brace Other Brace: Resting hand splint and Left PRAFO Restrictions Weight Bearing Restrictions: No     Mobility  Bed Mobility Overal bed mobility: Needs Assistance Bed Mobility: Rolling, Sidelying to  Sit Rolling: +2 for physical assistance, Mod assist Sidelying to sit: Mod assist, +2 for physical assistance, HOB elevated       General bed mobility comments: roll to left using RUE to pull on rail; assist to move legs over EOB and more assist to elevate trunk from left sidelying    Transfers Overall transfer level: Needs assistance Equipment used: 2 person hand held assist Transfers: Sit to/from Stand, Bed to chair/wheelchair/BSC Sit to Stand: Mod assist, +2 physical assistance Stand pivot transfers: Mod assist, +2 physical assistance         General transfer comment: left knee not buckling but ?hyperextended and closely guarded during standing and pivot; pt unable to advance either foot towards chair and pivoted on RLE; pt sat prematurely on edge of chair and required +2 assist to scoot hips back onto seat of chair    Ambulation/Gait               General Gait Details: unable   Stairs             Wheelchair Mobility    Modified Rankin (Stroke Patients Only) Modified Rankin (Stroke Patients Only) Pre-Morbid Rankin Score: No symptoms Modified Rankin: Severe disability     Balance Overall balance assessment: Needs assistance Sitting-balance support: Single extremity supported, Feet supported Sitting balance-Leahy Scale: Poor Sitting balance - Comments: mild left lean in sitting   Standing balance support: Bilateral upper extremity supported Standing balance-Leahy Scale: Poor Standing balance comment: also left lean                            Cognition Arousal/Alertness: Awake/alert Behavior During Therapy: Flat affect Overall Cognitive Status: Impaired/Different from baseline Area of Impairment:  Orientation, Following commands, Safety/judgement, Awareness, Problem solving                 Orientation Level: Disoriented to, Time     Following Commands: Follows one step commands with increased time Safety/Judgement: Decreased  awareness of safety, Decreased awareness of deficits Awareness: Intellectual Problem Solving: Slow processing, Decreased initiation, Difficulty sequencing, Requires verbal cues, Requires tactile cues General Comments: very slow to follow commands; stating repeatedly that she wants to go home with little awareness of her deficits        Exercises      General Comments General comments (skin integrity, edema, etc.): VSS on ICU monitors; on room air with sats 95% throughout      Pertinent Vitals/Pain Pain Assessment Pain Assessment: No/denies pain    Home Living                          Prior Function            PT Goals (current goals can now be found in the care plan section) Acute Rehab PT Goals Patient Stated Goal: unable to state Time For Goal Achievement: 10/11/22 Potential to Achieve Goals: Good Progress towards PT goals: Progressing toward goals    Frequency    Min 4X/week      PT Plan Current plan remains appropriate    Co-evaluation PT/OT/SLP Co-Evaluation/Treatment: Yes Reason for Co-Treatment: Complexity of the patient's impairments (multi-system involvement);To address functional/ADL transfers;For patient/therapist safety;Necessary to address cognition/behavior during functional activity PT goals addressed during session: Mobility/safety with mobility;Balance OT goals addressed during session: ADL's and self-care;Proper use of Adaptive equipment and DME;Strengthening/ROM      AM-PAC PT "6 Clicks" Mobility   Outcome Measure  Help needed turning from your back to your side while in a flat bed without using bedrails?: Total Help needed moving from lying on your back to sitting on the side of a flat bed without using bedrails?: Total Help needed moving to and from a bed to a chair (including a wheelchair)?: Total Help needed standing up from a chair using your arms (e.g., wheelchair or bedside chair)?: Total Help needed to walk in hospital  room?: Total Help needed climbing 3-5 steps with a railing? : Total 6 Click Score: 6    End of Session Equipment Utilized During Treatment: Gait belt Activity Tolerance: Patient tolerated treatment well Patient left: with call bell/phone within reach;in chair;with chair alarm set;with nursing/sitter in room;with family/visitor present (Rt wrist restraint and mitt) Nurse Communication: Mobility status;Need for lift equipment (due to pt's short stature, will need maxisky for return to bed) PT Visit Diagnosis: Hemiplegia and hemiparesis Hemiplegia - Right/Left: Left Hemiplegia - dominant/non-dominant: Non-dominant Hemiplegia - caused by: Cerebral infarction     Time: 1006-1036 PT Time Calculation (min) (ACUTE ONLY): 30 min  Charges:  $Therapeutic Activity: 8-22 mins                      Arby Barrette, PT Acute Rehabilitation Services  Office [phone]    Rexanne Mano 09/29/2022, 12:04 PM

## 2022-09-30 DIAGNOSIS — I429 Cardiomyopathy, unspecified: Secondary | ICD-10-CM

## 2022-09-30 LAB — CBC
HCT: 36 % (ref 36.0–46.0)
Hemoglobin: 11.8 g/dL — ABNORMAL LOW (ref 12.0–15.0)
MCH: 23 pg — ABNORMAL LOW (ref 26.0–34.0)
MCHC: 32.8 g/dL (ref 30.0–36.0)
MCV: 70 fL — ABNORMAL LOW (ref 80.0–100.0)
Platelets: 248 10*3/uL (ref 150–400)
RBC: 5.14 MIL/uL — ABNORMAL HIGH (ref 3.87–5.11)
RDW: 23.4 % — ABNORMAL HIGH (ref 11.5–15.5)
WBC: 7.9 10*3/uL (ref 4.0–10.5)
nRBC: 0.3 % — ABNORMAL HIGH (ref 0.0–0.2)

## 2022-09-30 LAB — BASIC METABOLIC PANEL
Anion gap: 11 (ref 5–15)
BUN: 26 mg/dL — ABNORMAL HIGH (ref 6–20)
CO2: 27 mmol/L (ref 22–32)
Calcium: 8.8 mg/dL — ABNORMAL LOW (ref 8.9–10.3)
Chloride: 97 mmol/L — ABNORMAL LOW (ref 98–111)
Creatinine, Ser: 1.04 mg/dL — ABNORMAL HIGH (ref 0.44–1.00)
GFR, Estimated: >60 mL/min (ref >60)
Glucose, Bld: 67 mg/dL — ABNORMAL LOW (ref 70–99)
Potassium: 3.9 mmol/L (ref 3.5–5.1)
Sodium: 135 mmol/L (ref 135–145)

## 2022-09-30 LAB — GLUCOSE, CAPILLARY
Glucose-Capillary: 78 mg/dL (ref 70–99)
Glucose-Capillary: 95 mg/dL (ref 70–99)
Glucose-Capillary: 151 mg/dL — ABNORMAL HIGH (ref 70–99)
Glucose-Capillary: 168 mg/dL — ABNORMAL HIGH (ref 70–99)
Glucose-Capillary: 151 mg/dL — ABNORMAL HIGH (ref 70–99)

## 2022-09-30 LAB — HEPARIN LEVEL (UNFRACTIONATED): Heparin Unfractionated: 0.4 IU/mL (ref 0.30–0.70)

## 2022-09-30 MED ORDER — INSULIN ASPART 100 UNIT/ML IJ SOLN
0.0000 [IU] | Freq: Three times a day (TID) | INTRAMUSCULAR | Status: DC
  Administered 2022-09-30 (×2): 3 [IU] via SUBCUTANEOUS
  Administered 2022-10-01 (×2): 2 [IU] via SUBCUTANEOUS

## 2022-09-30 MED ORDER — MORPHINE SULFATE 15 MG PO TABS
15.0000 mg | ORAL_TABLET | Freq: Four times a day (QID) | ORAL | Status: DC | PRN
  Administered 2022-09-30 – 2022-10-01 (×3): 15 mg via ORAL
  Filled 2022-09-30 (×3): qty 1

## 2022-09-30 MED ORDER — APIXABAN 5 MG PO TABS
5.0000 mg | ORAL_TABLET | Freq: Two times a day (BID) | ORAL | Status: DC
  Administered 2022-09-30 – 2022-10-01 (×3): 5 mg via ORAL
  Filled 2022-09-30 (×3): qty 1

## 2022-09-30 MED ORDER — INSULIN ASPART 100 UNIT/ML IJ SOLN: 0.0000 [IU] | Freq: Every day | INTRAMUSCULAR | Status: DC

## 2022-09-30 NOTE — Progress Notes (Signed)
Rounding Note    Patient Name: [name] Date of Encounter: 09/30/2022  [place] Cardiologist: None [place]   Subjective   Acute change afternoon of 09/24/22, found to have left sided weakness and right gaze deviation. Did not receive TNK due to being on IV heparin. 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 INR was subtherapeutic due to medication non-adherence at home. Upon anesthesia induction patient lost BP and pulses, 2 mins of ACLS provided per PCCM documentation. PCCM and stroke team now following.  Seen in ICU bed 4N20, with daughter at bedside. Sedation off, eyes open, patient not participating in conversation when spoken to directly. Remains intubated. Family questions answered from cardiac perspective.  3/4 Weaning sedation. No movements when I was in the room. Daughter at the bedside  Inpatient Medications    Scheduled Meds:  apixaban  5 mg Oral BID   atorvastatin  80 mg Oral QHS   Chlorhexidine Gluconate Cloth  6 each Topical Q0600   docusate  100 mg Oral BID   empagliflozin  10 mg Oral Daily   insulin aspart  0-15 Units Subcutaneous TID WC   insulin aspart  0-5 Units Subcutaneous QHS   losartan  50 mg Oral Daily   metoprolol tartrate  25 mg Oral BID   multivitamin with minerals  1 tablet Oral Daily   mouth rinse  15 mL Mouth Rinse Q4H   pantoprazole (PROTONIX) IV  40 mg Intravenous QHS   polyethylene glycol  17 g Oral Daily   spironolactone  25 mg Oral Daily   torsemide  40 mg Oral Daily   Continuous Infusions:  heparin 950 Units/hr (09/30/22 0900)   PRN Meds: acetaminophen **OR** acetaminophen (TYLENOL) oral liquid 160 mg/5 mL **OR** acetaminophen, ondansetron (ZOFRAN) IV, mouth rinse, oxyCODONE, phenol   Vital Signs    Vitals:   09/29/22 2000 09/30/22 0000 09/30/22 0400 09/30/22 0800  BP: (!) 145/112 116/86 107/88   Pulse: 89 82 73   Resp: '19 15 12   '$ Temp: 98.9 F (37.2 C) 98.6 F (37 C) 98 F (36.7 C) (!)  96.8 F (36 C)  TempSrc: Oral Oral Oral Axillary  SpO2: 96% 93% 95%   Weight:      Height:        Intake/Output Summary (Last 24 hours) at 09/30/2022 0953 Last data filed at 09/30/2022 0900 Gross per 24 hour  Intake 1527.45 ml  Output 3125 ml  Net -1597.55 ml      09/29/2022    4:16 AM 09/27/2022    5:00 AM 09/26/2022    4:19 AM  Last 3 Weights  Weight (lbs) 199 lb 1.2 oz 198 lb 3.1 oz 202 lb 2.6 oz  Weight (kg) 90.3 kg 89.9 kg 91.7 kg      Telemetry    Normal sinus rhythm- Personally Reviewed  ECG   NA- Personally Reviewed  Physical Exam   GEN: intubated. Cardiac: RRR, no murmurs, rubs, or gallops.  Respiratory: Diminished breath sounds GI: Soft, nontender, non-distended  MS: no pitting edema Neuro:  intubated  Psych: intubated  Labs    High Sensitivity Troponin:   Recent Labs  Lab 09/22/22 2050 09/22/22 2134  TROPONINIHS 197* 170*     Chemistry Recent Labs  Lab 09/26/22 1458 09/27/22 0459 09/27/22 1746 09/28/22 0456 09/29/22 0453 09/30/22 0444  NA  --  135  --  135 136 135  K  --  4.1  --  4.0 3.9 3.9  CL  --  100  --  100 98 97*  CO2  --  26  --  '26 27 27  '$ GLUCOSE  --  138*  --  184* 172* 67*  BUN  --  21*  --  27* 30* 26*  CREATININE  --  0.92  --  1.02* 0.92 1.04*  CALCIUM  --  8.3*  --  8.6* 8.8* 8.8*  MG 2.4 2.3 2.3  --   --   --   GFRNONAA  --  >60  --  >60 >60 >60  ANIONGAP  --  9  --  '9 11 11    '$ Lipids  Recent Labs  Lab 09/24/22 0130 09/25/22 0508 09/28/22 0456  CHOL 136  --   --   TRIG 62   < > 63  HDL 27*  --   --   LDLCALC 97  --   --   CHOLHDL 5.0  --   --    < > = values in this interval not displayed.    Hematology Recent Labs  Lab 09/28/22 0456 09/29/22 0453 09/30/22 0444  WBC 13.6* 10.5 7.9  RBC 5.34* 5.17* 5.14*  HGB 12.1 11.6* 11.8*  HCT 38.5 37.6 36.0  MCV 72.1* 72.7* 70.0*  MCH 22.7* 22.4* 23.0*  MCHC 31.4 30.9 32.8  RDW 23.9* 23.7* 23.4*  PLT 238 260 248   Thyroid  Recent Labs  Lab 09/24/22 0130   TSH 3.269    BNP No results for input(s): "BNP", "PROBNP" in the last 168 hours.   DDimer No results for input(s): "DDIMER" in the last 168 hours.   Radiology    No results found.  Cardiac Studies   TTE 09/25/2022 1. 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   2. Left ventricular ejection fraction, by estimation, is 15%. The left  ventricle has severely decreased function. The left ventricle demonstrates  global hypokinesis. There is mild left ventricular hypertrophy. Left  ventricular diastolic parameters are  consistent with Grade III diastolic dysfunction (restrictive).   3. Right ventricular systolic function is moderately reduced. The right  ventricular size is normal.   4. The mitral valve is grossly normal. Mild mitral valve regurgitation.  No evidence of mitral stenosis.   5. Tricuspid valve regurgitation is moderate.   6. The aortic valve is tricuspid. Aortic valve regurgitation is not  visualized. No aortic stenosis is present.   7. The inferior vena cava is normal in size with <50% respiratory  variability, suggesting right atrial pressure of 8 mmHg, patient currently  intubated on mechanical ventilation.   Patient Profile     47 y.o. 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%) felt to be nonischemic cardiomyopathy, paroxysmal atrial fibrillation and history of LV thrombus on Xarelto, type 2 DM, anemia, HTN  who is being seen 09/23/2022 for the evaluation of CHF and elevated troponin   Assessment & Plan    Principal Problem:   Acute on chronic combined systolic (congestive) and diastolic (congestive) heart failure (HCC) Active Problems:   Poorly controlled type 2 diabetes mellitus ([place])   Hypertension associated with diabetes (HCC)   Chronic nausea   Paroxysmal atrial fibrillation (HCC)   Elevated troponin   Hypokalemia   LV (left ventricular) mural thrombus   Anasarca   Middle cerebral artery embolism, right   Acute respiratory  failure with hypoxia ([place])   Cerebrovascular accident (CVA) due to embolism of right middle cerebral artery ([place])   Acute on  Chronic combined systolic and diastolic heart failure  Nonischemic cardiomyopathy  Acute ischemic stroke - Most recent echocardiogram from 02/23/22 showed EF <20%, grade III diastolic dysfunction, moderately reduced RV systolic function, mildly elevated pulmonary artery systolic pressure  - Cardiac MRI 08/2021 showed mildly dilated LV with EF 23%, moderate RV systolic dysfunction. There was no evidence of prior MI, infiltrative disease, myocarditis.  - R/L heart catheterization from 08/2021 showed mild, nonobstructive CAD - Patient has been admitted several times in the past for CHF exacerbation/fluid overload. Often in the setting of medication noncompliance or failure to follow low sodium diet  - Now, patient presented complaining of lower extremity edema, shortness of breath  - BNP elevated to 688. CXR with cardiomegaly and mild pulmonary vascular congestion  - S/P IV lasix. appears euvolemic - losartan 50 mg daily, Toprol-XL 25 mg daily, spironolactone 25 mg daily, jardiance 10 mg daily - No resolution of the LV thrombus on repeat echo - Maintain K greater than 4, mag greater than 2   Elevated Troponin  - hsTn 197>170  - Suspect this is demand ischemia in the setting of CHF  - Patient does complain of chest pain, but symptoms are very vague. Troponin trend is not consistent with ACS and patient had clean coronaries on cath from 08/2021. No plans for ischemic evaluation at this time    LV thrombus  Subtherapeutic INR on coumadin  - Echocardiogram from 02/2022 showed an LV thrombus present at apex measuring 2.2 x 1.4 with some mobility  - Patient was on Xarelto at one point, but has more recently been on coumadin  - INR subtherapeutic at 1.3 on 09/22/22. -Given her noncompliance with Coumadin clinic appointments, would recommend switching from Coumadin to Eliquis    Paroxysmal Atrial Fibrillation  - Patient is in sinus tachycardia with HR 100-110 or sinus rhythm - continue BB -continue eliquis   Mild Mitral Valve regurgitation  Mild-moderate tricuspid valve regurgitation  - stable   Otherwise per primary  - Type 2 DM, poorly controlled  - Chronic nausea    Final Recs: -Continue losartan 50 mg daily, Toprol tartrate 25 mg BID, spironolactone 25 mg daily, jardiance 10 mg daily. Continue torsemide 40 mg daily - continue eliquis 5 mg BID indefinitely - discussed with her and her father the importance of adherence - will plan for FU after rehab  For questions or updates, please contact [place] Please consult [email or web address] for contact info under   CRITICAL CARE Performed by: Phineas Inches, MD,MS   Total critical care time: 35 minutes   Critical care time was exclusive of separately billable procedures and treating other patients.   Critical care was necessary to treat or prevent imminent or life-threatening deterioration.   Critical care was time spent personally by me (independent of APPs or residents) on the following activities: development of treatment plan with patient and/or surrogate as well as nursing, discussions with consultants, evaluation of patient's response to treatment, examination of patient, obtaining history from patient or surrogate, ordering and performing treatments and interventions, ordering and review of laboratory studies, ordering and review of radiographic studies, pulse oximetry and re-evaluation of patient's condition.      Signed, Janina Mayo, MD  09/30/2022, 9:53 AM

## 2022-09-30 NOTE — Progress Notes (Signed)
ANTICOAGULATION CONSULT NOTE -   Pharmacy Consult for Heparin Indication:  LV Thrombus  Allergies  Allergen Reactions   Fish Allergy Swelling   Shellfish Allergy Anaphylaxis, Swelling and Other (See Comments)    Airway involvement including EMS - Has Epi-Pen   Metformin And Related Diarrhea   Trulicity [Dulaglutide] Nausea And Vomiting and Other (See Comments)    Multiple episodes of vomiting over multiple days   Nsaids Other (See Comments)    Per PCP interferes with daily meds (heart failure)   Advil [Ibuprofen] Other (See Comments)    Per PCP interferes with daily meds (heart failure)    Patient Measurements: Height: 4' 11.02" (149.9 cm) Weight: 90.3 kg (199 lb 1.2 oz) IBW/kg (Calculated) : 43.24 Heparin Dosing Weight: 65.6 kg  Vital Signs: Temp: 98 F (36.7 C) (03/08 0400) Temp Source: Oral (03/08 0400) BP: 107/88 (03/08 0400) Pulse Rate: 73 (03/08 0400)  Labs: Recent Labs    09/28/22 0456 09/29/22 0453 09/30/22 0444  HGB 12.1 11.6* 11.8*  HCT 38.5 37.6 36.0  PLT 238 260 248  HEPARINUNFRC 0.34 0.34 0.40  CREATININE 1.02* 0.92 1.04*     Estimated Creatinine Clearance: 67.6 mL/min (A) (by C-G formula based on SCr of 1.04 mg/dL (H)).   Medical History: Past Medical History:  Diagnosis Date   Abnormal Pap smear    Acute combined systolic and diastolic heart failure ([place]) 02/19/2019   Wt Readings from Last 3 Encounters:  09/18/21  84.7 kg  09/02/21  84.4 kg  08/31/21  84.2 kg         Acute heart failure ([place]) 01/29/2022   Acute on chronic combined systolic and diastolic CHF (congestive heart failure) ([place]) 02/19/2019   Acute on chronic congestive heart failure (HCC)    Acute on chronic congestive heart failure (HCC)    Acute on chronic diastolic CHF (congestive heart failure) ([place]) 10/19/2021   Acute on chronic systolic heart failure ([place]) 02/22/2022   Anemia    Asthma    Atrial fibrillation ([place])    history of paroxysmal atrial fibrillation   CHF  (congestive heart failure) ([place]) 11/02/2021   CHF exacerbation ([place]) 10/17/2021   Diabetes mellitus    DKA, type 2 ([place]) 02/06/2019   Elevated troponin 09/30/2019   Heart failure with reduced ejection fraction ([place])    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     Migraines    Morbid obesity ([place]) 02/06/2019   Nausea & vomiting 06/26/2022   Nonischemic cardiomyopathy ([place]) 09/22/2021   NSTEMI (non-ST elevated myocardial infarction) ([place]) 02/23/2022   Obstructive sleep apnea 06/30/2015   Polycystic ovarian disease     Assessment:  47 y.o. 2 week history of lower extremity edema, shortness of breath, and chest pain. Patient developed stroke as inpatient. Now found to have an LV Thrombus.  Pharmacy consulted to start heparin infusion for LV thrombus using the stroke protocol (no bolus).  Patient on warfarin PTA.  HL 0.40 on 950 units/h - therapeutic. CBC stable   Goal of Therapy:  Heparin level 0.3-0.5 units/ml Monitor platelets by anticoagulation protocol: Yes   Plan:  Continue heparin infusion at  950 units/hr Check anti-Xa level daily while on heparin Continue to monitor H&H and platelets F/u transition to enteral options (eliquis copay $4.60/mo)  Wilson Singer, PharmD Clinical Pharmacist 09/30/2022 7:08 AM

## 2022-09-30 NOTE — Progress Notes (Addendum)
Inpatient Rehabilitation Admissions Coordinator   I met with patient , Dad and her best friend at bedside. Also contacted Dr Erlinda Hong. We can plan admit to CIR on Sunday when bed is available. Patient and Dad are in agreement. I left voicemail for her daughter, Leroy Kennedy, of plans to admit on Sunday.I will alert acute team and TOC. I will make the arrangements to admit Sunday. Dr Naaman Plummer will assess patient on Sunday for final approval. Unit RN can then contact Rehab nurse Sunday at 12 noon to give report and clarify when bed will be available at [phone].  Danne Baxter, RN, MSN Rehab Admissions Coordinator [phone] 09/30/2022 11:53 AM

## 2022-09-30 NOTE — Progress Notes (Signed)
Physical Therapy Treatment Patient Details Name: [name] MRN: [id number] DOB: 1975/09/30 Today's Date: 09/30/2022   History of Present Illness 47 y.o. female admitted 2/29 for SOB related to acute on chronic CHF. 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 The pt continues to require modA of 2 to power up to standing, but was able to complete x5 through session from both EOB and recliner. She  does require positioning and blocking of LLE for safe wt bearing at this time, but was able to progress to good initiation of wt shift to R and L in preparation for gait training. The pt remains unable to advance either leg for pivotal steps at this time, but is able to transition to recliner with assist of 2. Current recommendations remain appropriate.      Recommendations for follow up therapy are one component of a multi-disciplinary discharge planning process, led by the attending physician.  Recommendations may be updated based on patient status, additional functional criteria and insurance authorization.  Follow Up Recommendations  Acute inpatient rehab (3hours/day)     Assistance Recommended at Discharge Frequent or constant Supervision/Assistance  Patient can return home with the following Other (comment) (cannot currently be cared for at home)   Equipment Recommendations  Wheelchair (measurements PT);Wheelchair cushion (measurements PT);Hospital bed    Recommendations for Other Services Rehab consult     Precautions / Restrictions Precautions Precautions: Fall;Other (comment) Precaution Comments: left hemiparesis, cortrak Required Braces or Orthoses: Other Brace Other  Brace: Resting hand splint and Left PRAFO Restrictions Weight Bearing Restrictions: No     Mobility  Bed Mobility Overal bed mobility: Needs Assistance Bed Mobility: Rolling, Sidelying to Sit Rolling: Mod assist Sidelying to sit: Mod assist       General bed mobility comments: modA to roll to R and modA to elevate trunk to sitting. mod A to scoot to EOB and then pt needing support to maintain static sitting balance (leaning forwards and to L)    Transfers Overall transfer level: Needs assistance Equipment used: 2 person hand held assist Transfers: Sit to/from Stand, Bed to chair/wheelchair/BSC Sit to Stand: Mod assist, +2 physical assistance Stand pivot transfers: Mod assist, +2 physical assistance         General transfer comment: modA to rise to standing with assist to block L knee and extend at hips. pt completed x5 in session. cues for wt shift to R. unable to clear feet for steps to recliner    Ambulation/Gait             Pre-gait activities: standing wt shift x 10 General Gait Details: unable    Modified Rankin (Stroke Patients Only) Modified Rankin (Stroke Patients Only) Pre-Morbid Rankin Score: No symptoms Modified Rankin: Severe disability     Balance Overall balance assessment: Needs assistance Sitting-balance support: Single extremity supported, Feet supported Sitting balance-Leahy Scale: Poor Sitting balance - Comments: mild left lean in sitting   Standing balance support: Bilateral upper extremity supported Standing balance-Leahy Scale: Poor Standing balance comment: also left lean, able to manage wt shift to R with direct cues but unable to maintain  Cognition Arousal/Alertness: Awake/alert Behavior During Therapy: Flat affect Overall Cognitive Status: Impaired/Different from baseline Area of Impairment: Orientation, Following commands, Safety/judgement, Awareness, Problem solving                  Orientation Level: Disoriented to, Time     Following Commands: Follows one step commands with increased time Safety/Judgement: Decreased awareness of safety, Decreased awareness of deficits Awareness: Intellectual Problem Solving: Slow processing, Decreased initiation, Difficulty sequencing, Requires verbal cues, Requires tactile cues General Comments: slow to follow commands, able to express needs and remember family phone numbers. seems unaware of errors with use of phone        Exercises Other Exercises Other Exercises: standing wt shift to R x 10 Other Exercises: partial squat/sit-stand with modA to achieve x 5    General Comments General comments (skin integrity, edema, etc.): VSS on RA      Pertinent Vitals/Pain Pain Assessment Pain Assessment: Faces Faces Pain Scale: Hurts a little bit Pain Location: L ankle with ROM Pain Descriptors / Indicators: Sore Pain Intervention(s): Limited activity within patient's tolerance, Monitored during session, Repositioned     PT Goals (current goals can now be found in the care plan section) Acute Rehab PT Goals Patient Stated Goal: unable to state PT Goal Formulation: With patient Time For Goal Achievement: 10/11/22 Potential to Achieve Goals: Good Progress towards PT goals: Progressing toward goals    Frequency    Min 4X/week      PT Plan Current plan remains appropriate       AM-PAC PT "6 Clicks" Mobility   Outcome Measure  Help needed turning from your back to your side while in a flat bed without using bedrails?: Total Help needed moving from lying on your back to sitting on the side of a flat bed without using bedrails?: Total Help needed moving to and from a bed to a chair (including a wheelchair)?: Total Help needed standing up from a chair using your arms (e.g., wheelchair or bedside chair)?: Total Help needed to walk in hospital room?: Total Help needed climbing 3-5 steps with a railing? : Total 6 Click  Score: 6    End of Session Equipment Utilized During Treatment: Gait belt Activity Tolerance: Patient tolerated treatment well Patient left: with call bell/phone within reach;in chair;with chair alarm set Nurse Communication: Mobility status;Need for lift equipment PT Visit Diagnosis: Hemiplegia and hemiparesis Hemiplegia - Right/Left: Left Hemiplegia - dominant/non-dominant: Non-dominant Hemiplegia - caused by: Cerebral infarction     Time: [id number] PT Time Calculation (min) (ACUTE ONLY): 31 min  Charges:  $Therapeutic Exercise: 23-37 mins                     West Carbo, PT, DPT   Acute Rehabilitation Department Office [phone] Secure Chat Communication Preferred   Sandra Cockayne 09/30/2022, 4:40 PM

## 2022-09-30 NOTE — Progress Notes (Addendum)
Speech Language Pathology Treatment: Cognitive-Linquistic  Patient Details Name: [name] MRN: [id number] DOB: 1975/11/09 Today's Date: 09/30/2022 Time: [id number] SLP Time Calculation (min) (ACUTE ONLY): 20 min  Assessment / Plan / Recommendation Clinical Impression  Pt was seen for dysphagia treatment with her best friend and father present. Multiple cups with straws were noted in the room as well as pre-thickened nectar thick juices. Pt's father reported that he has been careful to not give the pt thin liquids via straw. Pt was alert and cooperative during the session. She demonstrated some visual tracking to the left and was better able to accurately recall tasks from during the day. Pt exhibited improved awareness and reported that she now believes that she needs rehab. Pt tolerated individual boluses of thin liquids via cup and straw and dual consistency boluses of pears with juice without overt s/s of aspiration. Mastication was [facility] with adequate oral clearance and now functional bolus awareness. Left-sided anterior spillage was noted with thin liquids via cup and with dual consistency boluses. Pt's diet will be advanced to dysphagia 3 with thin liquids and SLP will continue to follow pt.     HPI HPI: Pt is a 47 y.o. female who presented 2/29 for SOB related to acute on chronic CHF. MRI brain 3/3: Large area of acute infarct in the right MCA distribution, smaller area of acute infarct in the right ACA distribution, and  punctate acute infarct in the left cerebellar hemisphere. Persistent occlusion of a right M2 branch with minimal collateral flow in the MCA distribution aside from in the region of the frontal operculum. IR thrombectomy attempted 3/2, but was unsuccessful due to cardiac arrest during procedure with <1 minute of chest compressions.. Pt remained  remained intubated post-procedure. ETT 3/2-3/5 (self-extubation). Pt failed the [facility] 3/6 due to coughing. PMH: CAD, DM2, HTN, a fib,  asthma, anxiety, depression      SLP Plan  Continue with current plan of care      Recommendations for follow up therapy are one component of a multi-disciplinary discharge planning process, led by the attending physician.  Recommendations may be updated based on patient status, additional functional criteria and insurance authorization.    Recommendations  Liquids provided via: Cup;No straw Medication Administration: Via alternative means (or crushed with puree) Supervision: Staff to assist with self feeding Compensations: Small sips/bites;Slow rate Postural Changes and/or Swallow Maneuvers: Seated upright 90 degrees                Oral Care Recommendations: Oral care QID;Oral care prior to ice chip/H20 Follow Up Recommendations: Acute inpatient rehab (3hours/day) Assistance recommended at discharge: Frequent or constant Supervision/Assistance SLP Visit Diagnosis: Dysphagia, unspecified (R13.10) Plan: Continue with current plan of care         Harlie Ragle I. [name], [place], [place] Office number [phone]   [name]  09/30/2022, 12:07 PM

## 2022-09-30 NOTE — H&P (Signed)
Physical Medicine and Rehabilitation Admission H&P    Chief Complaint  Patient presents with   Acute functional decline due to stroke.     HPI:  [name] is a 47 year old female withhistory fo T2DM, NICM w/chronic systolic/diastolic HF, PAF w/LV thrombus who was presented to ED on 09/22/22 with reports of SOB and CP  X 2 weeks with BLE edema.CTA chest negative for PE and showed anasarca. She was found to have elevated troponins with acute on chronic combined CHF and started on IV Heparin as well as IV diuresis. Cardiology was consulted and felt that patient with demand ischemia due to CHF as EKG without ST changes.  She continued to have issues with SOB and medication being adjusted by cards with recommendations to transition patient to Eliquis due to noncompliance hx. On afternoon of 03/02, she developed acute onset of right gaze deviation with left hemiplegia. CTA  head/neck done revealing loss of gray white differentiation in insula likely reflecting early infarct, proximal right M2 occlusion with minimal flow throughout the MCA division. She underwent cerebral angio with endovascular revascularization of occluded inferior division of M2 segment of R-MCA with aspiration and without significant change w/TICI 2B revascularization.  She did have briefe cardiac arrest during IR procedure requiring 2 minutes ACLS prior to ROSC.   2D echo done revealing large LV apical thrombus with severe reduction in LVEF with global hypokinesis, EF 15% and moderate TVR. MRI brain done revealing large area of infarct in R-MCA distribution with small area in right ACA distribution and acute punctate infarct in left cerebellar hemisphere. Dr. Erlinda Hong felt that stroke was "likely embolic from LV thrombus with Low EF and PAF even while on heparin". She was kept intubated till mental status improved and self extubated on 03/05. Respiratory status stable, lethargy was slowly resolving, cortak removed and diet advanced to  D3, thins. She was transitioned to Apixaban 03/08   Therapy has been working with patient who continues to be limited by left hemiplegia with left inattention and right gaze preference, decreased balance, lacks awareness of deficits, impulsivity with delay in processing and ability to follow one step commands as well as perseveration on going home. CIR recommended due to functional decline.       Review of Systems  Constitutional:  Negative for chills and fever.  HENT:  Negative for hearing loss and tinnitus.   Eyes:  Negative for blurred vision.  Respiratory:  Negative for cough.   Cardiovascular:  Negative for chest pain and claudication.  Gastrointestinal:  Positive for nausea.  Genitourinary:  Negative for dysuria.  Musculoskeletal:  Positive for back pain and neck pain.  Skin:  Negative for rash.  Neurological:  Positive for sensory change, weakness and headaches.  Psychiatric/Behavioral:  The patient is nervous/anxious. The patient does not have insomnia.     Past Medical History:  Diagnosis Date   Abnormal Pap smear    Acute combined systolic and diastolic heart failure ([place]) 02/19/2019   Wt Readings from Last 3 Encounters:  09/18/21  84.7 kg  09/02/21  84.4 kg  08/31/21  84.2 kg         Acute heart failure ([place]) 01/29/2022   Acute on chronic combined systolic and diastolic CHF (congestive heart failure) ([place]) 02/19/2019   Acute on chronic congestive heart failure (HCC)    Acute on chronic congestive heart failure (HCC)    Acute on chronic diastolic CHF (congestive heart failure) ([place]) 10/19/2021   Acute on  chronic systolic heart failure ([place]) 02/22/2022   Anemia    Asthma    Atrial fibrillation (HCC)    history of paroxysmal atrial fibrillation   CHF (congestive heart failure) ([place]) 11/02/2021   CHF exacerbation ([place]) 10/17/2021   Diabetes mellitus    DKA, type 2 ([place]) 02/06/2019   Elevated troponin 09/30/2019   Heart failure with reduced ejection fraction ([place])     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     Migraines    Morbid obesity ([place]) 02/06/2019   Nausea & vomiting 06/26/2022   Nonischemic cardiomyopathy ([place]) 09/22/2021   NSTEMI (non-ST elevated myocardial infarction) ([place]) 02/23/2022   Obstructive sleep apnea 06/30/2015   Polycystic ovarian disease     Past Surgical History:  Procedure Laterality Date   BIOPSY  11/05/2021   Procedure: BIOPSY;  Surgeon: Daryel November, MD;  Location: [facility] ENDOSCOPY;  Service: Gastroenterology;;   CERVIX LESION DESTRUCTION     CESAREAN SECTION     ESOPHAGOGASTRODUODENOSCOPY (EGD) WITH PROPOFOL N/A 11/05/2021   Procedure: ESOPHAGOGASTRODUODENOSCOPY (EGD) WITH PROPOFOL;  Surgeon: Daryel November, MD;  Location: [facility] ENDOSCOPY;  Service: Gastroenterology;  Laterality: N/A;   IR CT HEAD LTD  09/24/2022   IR PERCUTANEOUS ART THROMBECTOMY/INFUSION INTRACRANIAL INC DIAG ANGIO  09/24/2022   RADIOLOGY WITH ANESTHESIA N/A 09/24/2022   Procedure: IR WITH ANESTHESIA;  Surgeon: Radiologist, Medication, MD;  Location: [facility];  Service: Radiology;  Laterality: N/A;   RIGHT/LEFT HEART CATH AND CORONARY ANGIOGRAPHY N/A 03/29/2019   Procedure: RIGHT/LEFT HEART CATH AND CORONARY ANGIOGRAPHY;  Surgeon: Larey Dresser, MD;  Location: [facility] CV LAB;  Service: Cardiovascular;  Laterality: N/A;   RIGHT/LEFT HEART CATH AND CORONARY ANGIOGRAPHY N/A 09/20/2021   Procedure: RIGHT/LEFT HEART CATH AND CORONARY ANGIOGRAPHY;  Surgeon: Larey Dresser, MD;  Location: [facility];  Service: Cardiovascular;  Laterality: N/A;    Family History  Problem Relation Age of  Onset   Diabetes Mother    Hypertension Mother    Hypertension Father    Diabetes Father     Social History: Single. Works as a Teacher, adult education. She reports that she has never smoked. She has never used smokeless tobacco. She reports that she does not drink alcohol and does not use drugs.   Allergies  Allergen Reactions   Fish Allergy Swelling   Shellfish Allergy Anaphylaxis, Swelling and Other (See Comments)    Airway involvement including EMS - Has Epi-Pen   Metformin And Related Diarrhea   Trulicity [Dulaglutide] Nausea And Vomiting and Other (See Comments)    Multiple episodes of vomiting over multiple days   Nsaids Other (See Comments)    Per PCP interferes with daily meds (heart failure)   Advil [Ibuprofen] Other (See Comments)    Per PCP interferes with daily meds (heart failure)    Medications Prior to Admission  Medication Sig Dispense Refill   acetaminophen (TYLENOL) 500 MG tablet Take 500-1,000 mg by mouth every 6 (six) hours as needed for mild pain or headache.     atorvastatin (LIPITOR) 80 MG tablet Take 1 tablet (80 mg total) by mouth  daily. 90 tablet 3   empagliflozin (JARDIANCE) 10 MG TABS tablet Take 1 tablet (10 mg total) by mouth daily. 30 tablet 0   insulin aspart (NOVOLOG FLEXPEN) 100 UNIT/ML FlexPen Inject 12 Units into the skin daily before supper. 15 mL 0   insulin glargine (LANTUS) 100 UNIT/ML Solostar Pen Inject 15 Units into the skin daily. 15 mL 0   losartan (COZAAR) 50 MG tablet Take 1 tablet (50 mg total) by mouth daily. 30 tablet 0   warfarin (COUMADIN) 7.5 MG tablet Take 1-2 tablets (7.5-15 mg total) by mouth daily as directed by [facility] (Patient taking differently: Take 7.5-11.25 mg by mouth as directed. Take 1 tablet (7.5 mg) tablet by mouth every Monday & Take 1.5 tablets (11.25 mg) all other days) 40 tablet 0   Accu-Chek Softclix Lancets lancets Use as instructed 100 each 12   Accu-Chek Softclix Lancets lancets Use up 4  (four) times daily as directed 100 each 0   blood glucose meter kit and supplies Use up to 4 (four) times daily as directed 1 each 0   ferrous sulfate 325 (65 FE) MG tablet Take 1 tablet (325 mg total) by mouth daily with breakfast. (Patient not taking: Reported on 09/23/2022) 30 tablet 0   glucose blood (ONETOUCH VERIO) test strip Use up to 4 (four) times daily as directed 100 each 0   hydrOXYzine (ATARAX) 10 MG tablet Take 1 tablet (10 mg total) by mouth 3 (three) times daily as needed. (Patient taking differently: Take 10 mg by mouth 3 (three) times daily as needed for anxiety.) 90 tablet 3   Insulin Pen Needle (UNIFINE PENTIPS) 31G X 5 MM MISC USE AS DIRECTED 2 TIMES DAILY 100 each 11   metoCLOPramide (REGLAN) 5 MG tablet Take 1 tablet (5 mg total) by mouth 4 (four) times daily -  before meals and at bedtime. (Patient not taking: Reported on 09/23/2022) 60 tablet 0   metoprolol succinate (TOPROL-XL) 25 MG 24 hr tablet Take 1 tablet (25 mg total) by mouth daily. 30 tablet 0   polyethylene glycol (MIRALAX / GLYCOLAX) 17 g packet Take 17 g by mouth daily as needed for mild constipation. 14 each 0   prochlorperazine (COMPAZINE) 5 MG tablet Take 1 tablet (5 mg total) by mouth every 6 (six) hours as needed for refractory nausea / vomiting. 30 tablet 0   senna-docusate (SENOKOT-S) 8.6-50 MG tablet Take 1 tablet by mouth at bedtime as needed for moderate constipation. 30 tablet 0   sertraline (ZOLOFT) 50 MG tablet Take 50 mg by mouth daily.     spironolactone (ALDACTONE) 25 MG tablet Take 25 mg by mouth daily. Take 1 Tablet Daily     Torsemide 40 MG TABS Take 40 mg by mouth 2 (two) times daily. 180 tablet 3   traZODone (DESYREL) 50 MG tablet Take 1 tablet (50 mg total) by mouth at bedtime. 30 tablet 0      Home: Home Living Family/patient expects to be discharged to:: Private residence Living Arrangements: Alone Available Help at Discharge: Family, Friend(s), Available 24 hours/day (daughter, dtr's  partner, second dtr, and family) Type of Home: Apartment Home Access: Stairs to enter Technical brewer of Steps: 1 Home Layout: Two level, 1/2 bath on main level, Bed/bath upstairs Alternate Level Stairs-Number of Steps: flight Alternate Level Stairs-Rails: Right Bathroom Shower/Tub: Chiropodist: Standard Bathroom Accessibility: Yes Home Equipment: Hand held shower head Additional Comments: in the process of getting a handicapped accessible apartment  Lives  With: Alone   Functional History: Prior Function Prior Level of Function : Independent/Modified Independent, Driving, Working/employed Mobility Comments: no AD, works as a Research officer, 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 Her boyfriend has been driving her to/from work. ADLs Comments: mod I  Functional Status:  Mobility: Bed Mobility Overal bed mobility: Needs Assistance Bed Mobility: Rolling, Sidelying to Sit Rolling: +2 for physical assistance, Mod assist Sidelying to sit: Mod assist, +2 for physical assistance, HOB elevated General bed mobility comments: roll to left using RUE to pull on rail; assist to move legs over EOB and more assist to elevate trunk from left sidelying Transfers Overall transfer level: Needs assistance Equipment used: 2 person hand held assist Transfers: Sit to/from Stand, Bed to chair/wheelchair/BSC Sit to Stand: Mod assist, +2 physical assistance Bed to/from chair/wheelchair/BSC transfer type:: Stand pivot Stand pivot transfers: Mod assist, +2 physical assistance General transfer comment: left knee not buckling although hyperextended and closely guarded during standing and pivot; pt unable to advance either foot towards chair and pivoted on RLE; pt sat prematurely on edge of chair and required +2 assist to scoot hips back onto seat of chair Ambulation/Gait Ambulation/Gait assistance: Min guard Gait Distance (Feet): 30 Feet Assistive device: None Gait Pattern/deviations: Step-through  pattern, Decreased stride length General Gait Details: unable Gait velocity: decreased Gait velocity interpretation: <1.31 ft/sec, indicative of household ambulator    ADL: ADL Overall ADL's : Needs assistance/impaired Eating/Feeding: Independent Grooming: Wash/dry hands, Wash/dry face, Supervision/safety, Standing Upper Body Bathing: Supervision/ safety, Set up Lower Body Bathing: Supervison/ safety, Set up Upper Body Dressing : Set up Lower Body Dressing: Set up Toilet Transfer: Supervision/safety, Ambulation, Comfort height toilet Toileting- Clothing Manipulation and Hygiene: Supervision/safety Functional mobility during ADLs: Supervision/safety General ADL Comments: total (A) for all care at this time  Cognition: Cognition Overall Cognitive Status: Impaired/Different from baseline Arousal/Alertness: Awake/alert Orientation Level: Oriented X4 Year:  (1962) Month: March Day of Week: Incorrect Attention: Focused, Sustained Focused Attention: Appears intact Sustained Attention: Impaired Sustained Attention Impairment: Verbal basic Memory: Impaired Memory Impairment: Retrieval deficit (Immediate: 5/5 with repetition x2; delayed: 1/5; with cues: 1/4) Awareness: Impaired Awareness Impairment: Intellectual impairment Problem Solving: Impaired Problem Solving Impairment: Verbal basic Cognition Arousal/Alertness: Awake/alert Behavior During Therapy: Flat affect Overall Cognitive Status: Impaired/Different from baseline Area of Impairment: Orientation, Following commands, Safety/judgement, Awareness, Problem solving Orientation Level: Disoriented to, Time Following Commands: Follows one step commands with increased time Safety/Judgement: Decreased awareness of safety, Decreased awareness of deficits Awareness: Intellectual Problem Solving: Slow processing, Decreased initiation, Difficulty sequencing, Requires verbal cues, Requires tactile cues General Comments: very slow to  follow commands; stating repeatedly that she wants to go home with little awareness of her deficits Difficult to assess due to: Impaired communication, Level of arousal   Blood pressure (!) 131/106, pulse 82, temperature (!) 96.8 F (36 C), temperature source Axillary, resp. rate 19, height 4' 11.02" (1.499 m), weight 90.3 kg, last menstrual period 06/22/2022, SpO2 96 %. Physical Exam Vitals and nursing note reviewed.  Constitutional:      General: She is not in acute distress.    Appearance: She is obese.     Comments: Kept eyes turned to the right but able to turn to midline and left with minimal cues.   HENT:     Head: Normocephalic.     Mouth/Throat:     Mouth: Mucous membranes are moist.     Comments: Tongue with white coating Eyes:     Conjunctiva/sclera: Conjunctivae normal.     Pupils:  Pupils are equal, round, and reactive to light.  Cardiovascular:     Rate and Rhythm: Normal rate and regular rhythm.  Pulmonary:     Effort: Pulmonary effort is normal. No respiratory distress.     Breath sounds: Normal breath sounds. No wheezing.  Abdominal:     General: Bowel sounds are normal.     Palpations: Abdomen is soft.  Musculoskeletal:        General: No swelling or tenderness.  Skin:    General: Skin is warm and dry.  Neurological:     Mental Status: She is alert.     Comments: Pt appears lethargic but awakens and responds to questions. Speech dysarthric, low volume. Language appears to be intact. Right gaze preference but can follow to left to a lesser extent. No obvious field cut. Left central 7 and tongue deviation. LUE and LLE 0/5. Senses pain and LT in all 4's. No resting tone. DTR's 1+ throughout. Toes down left foot, no clonus.   Psychiatric:     Comments: Flat, depressed     Results for orders placed or performed during the hospital encounter of 09/22/22 (from the past 48 hour(s))  Glucose, capillary     Status: Abnormal   Collection Time: 09/28/22  3:24 PM   Result Value Ref Range   Glucose-Capillary 180 (H) 70 - 99 mg/dL    Comment: Glucose reference range applies only to samples taken after fasting for at least 8 hours.  Glucose, capillary     Status: Abnormal   Collection Time: 09/28/22  7:31 PM  Result Value Ref Range   Glucose-Capillary 189 (H) 70 - 99 mg/dL    Comment: Glucose reference range applies only to samples taken after fasting for at least 8 hours.  Urinalysis, Complete w Microscopic -Urine, Clean Catch     Status: Abnormal   Collection Time: 09/28/22  7:51 PM  Result Value Ref Range   Color, Urine AMBER (A) YELLOW    Comment: BIOCHEMICALS MAY BE AFFECTED BY COLOR   APPearance HAZY (A) CLEAR   Specific Gravity, Urine 1.024 1.005 - 1.030   pH 6.0 5.0 - 8.0   Glucose, UA >=500 (A) NEGATIVE mg/dL   Hgb urine dipstick NEGATIVE NEGATIVE   Bilirubin Urine NEGATIVE NEGATIVE   Ketones, ur NEGATIVE NEGATIVE mg/dL   Protein, ur 100 (A) NEGATIVE mg/dL   Nitrite NEGATIVE NEGATIVE   Leukocytes,Ua MODERATE (A) NEGATIVE   RBC / HPF 0-5 0 - 5 RBC/hpf   WBC, UA 11-20 0 - 5 WBC/hpf   Bacteria, UA RARE (A) NONE SEEN   Squamous Epithelial / HPF 0-5 0 - 5 /HPF   Mucus PRESENT     Comment: Performed at [facility], [address] [place], [place] [address]  Glucose, capillary     Status: Abnormal   Collection Time: 09/28/22 11:17 PM  Result Value Ref Range   Glucose-Capillary 165 (H) 70 - 99 mg/dL    Comment: Glucose reference range applies only to samples taken after fasting for at least 8 hours.  Glucose, capillary     Status: Abnormal   Collection Time: 09/29/22  3:49 AM  Result Value Ref Range   Glucose-Capillary 183 (H) 70 - 99 mg/dL    Comment: Glucose reference range applies only to samples taken after fasting for at least 8 hours.  CBC     Status: Abnormal   Collection Time: 09/29/22  4:53 AM  Result Value Ref Range   WBC 10.5 4.0 -  10.5 K/uL   RBC 5.17 (H) 3.87 - 5.11 MIL/uL   Hemoglobin 11.6 (L) 12.0 - 15.0 g/dL    HCT 37.6 36.0 - 46.0 %   MCV 72.7 (L) 80.0 - 100.0 fL   MCH 22.4 (L) 26.0 - 34.0 pg   MCHC 30.9 30.0 - 36.0 g/dL   RDW 23.7 (H) 11.5 - 15.5 %   Platelets 260 150 - 400 K/uL    Comment: REPEATED TO VERIFY   nRBC 0.2 0.0 - 0.2 %    Comment: Performed at [facility], [place] [address] [place], [place] [address]  Heparin level (unfractionated)     Status: None   Collection Time: 09/29/22  4:53 AM  Result Value Ref Range   Heparin Unfractionated 0.34 0.30 - 0.70 IU/mL    Comment: (NOTE) The clinical reportable range upper limit is being lowered to >1.10 to align with the FDA approved guidance for the current laboratory assay.  If heparin results are below expected values, and patient dosage has  been confirmed, suggest follow up testing of antithrombin III levels. Performed at [facility], [place] [address], [place], [place] [id number]   Basic metabolic panel     Status: Abnormal   Collection Time: 09/29/22  4:53 AM  Result Value Ref Range   Sodium 136 135 - 145 mmol/L   Potassium 3.9 3.5 - 5.1 mmol/L   Chloride 98 98 - 111 mmol/L   CO2 27 22 - 32 mmol/L   Glucose, Bld 172 (H) 70 - 99 mg/dL    Comment: Glucose reference range applies only to samples taken after fasting for at least 8 hours.   BUN 30 (H) 6 - 20 mg/dL   Creatinine, Ser 0.92 0.44 - 1.00 mg/dL   Calcium 8.8 (L) 8.9 - 10.3 mg/dL   GFR, Estimated >60 >60 mL/min    Comment: (NOTE) Calculated using the CKD-EPI Creatinine Equation (2021)    Anion gap 11 5 - 15    Comment: Performed at [facility] [address] [place], [place] [address]  Glucose, capillary     Status: Abnormal   Collection Time: 09/29/22  7:33 AM  Result Value Ref Range   Glucose-Capillary 178 (H) 70 - 99 mg/dL    Comment: Glucose reference range applies only to samples taken after fasting for at least 8 hours.  Glucose, capillary     Status: Abnormal   Collection Time: 09/29/22 11:26 AM  Result Value Ref Range    Glucose-Capillary 210 (H) 70 - 99 mg/dL    Comment: Glucose reference range applies only to samples taken after fasting for at least 8 hours.  Glucose, capillary     Status: Abnormal   Collection Time: 09/29/22  3:39 PM  Result Value Ref Range   Glucose-Capillary 198 (H) 70 - 99 mg/dL    Comment: Glucose reference range applies only to samples taken after fasting for at least 8 hours.  Glucose, capillary     Status: Abnormal   Collection Time: 09/29/22  7:31 PM  Result Value Ref Range   Glucose-Capillary 185 (H) 70 - 99 mg/dL    Comment: Glucose reference range applies only to samples taken after fasting for at least 8 hours.  Glucose, capillary     Status: Abnormal   Collection Time: 09/29/22 11:26 PM  Result Value Ref Range   Glucose-Capillary 123 (H) 70 - 99 mg/dL    Comment: Glucose reference range applies only to samples taken after  fasting for at least 8 hours.  Glucose, capillary     Status: None   Collection Time: 09/30/22  3:21 AM  Result Value Ref Range   Glucose-Capillary 78 70 - 99 mg/dL    Comment: Glucose reference range applies only to samples taken after fasting for at least 8 hours.  CBC     Status: Abnormal   Collection Time: 09/30/22  4:44 AM  Result Value Ref Range   WBC 7.9 4.0 - 10.5 K/uL   RBC 5.14 (H) 3.87 - 5.11 MIL/uL   Hemoglobin 11.8 (L) 12.0 - 15.0 g/dL   HCT 36.0 36.0 - 46.0 %   MCV 70.0 (L) 80.0 - 100.0 fL   MCH 23.0 (L) 26.0 - 34.0 pg   MCHC 32.8 30.0 - 36.0 g/dL   RDW 23.4 (H) 11.5 - 15.5 %   Platelets 248 150 - 400 K/uL    Comment: REPEATED TO VERIFY   nRBC 0.3 (H) 0.0 - 0.2 %    Comment: Performed at [facility], [place] [address], [place], [place] [address]  Heparin level (unfractionated)     Status: None   Collection Time: 09/30/22  4:44 AM  Result Value Ref Range   Heparin Unfractionated 0.40 0.30 - 0.70 IU/mL    Comment: (NOTE) The clinical reportable range upper limit is being lowered to >1.10 to align with the FDA approved  guidance for the current laboratory assay.  If heparin results are below expected values, and patient dosage has  been confirmed, suggest follow up testing of antithrombin III levels. Performed at [facility], [place] [address], [place], [place] [id number]   Basic metabolic panel     Status: Abnormal   Collection Time: 09/30/22  4:44 AM  Result Value Ref Range   Sodium 135 135 - 145 mmol/L   Potassium 3.9 3.5 - 5.1 mmol/L   Chloride 97 (L) 98 - 111 mmol/L   CO2 27 22 - 32 mmol/L   Glucose, Bld 67 (L) 70 - 99 mg/dL    Comment: Glucose reference range applies only to samples taken after fasting for at least 8 hours.   BUN 26 (H) 6 - 20 mg/dL   Creatinine, Ser 1.04 (H) 0.44 - 1.00 mg/dL   Calcium 8.8 (L) 8.9 - 10.3 mg/dL   GFR, Estimated >60 >60 mL/min    Comment: (NOTE) Calculated using the CKD-EPI Creatinine Equation (2021)    Anion gap 11 5 - 15    Comment: Performed at [facility] [address] [place], [place] [address]  Glucose, capillary     Status: None   Collection Time: 09/30/22  8:05 AM  Result Value Ref Range   Glucose-Capillary 95 70 - 99 mg/dL    Comment: Glucose reference range applies only to samples taken after fasting for at least 8 hours.  Glucose, capillary     Status: Abnormal   Collection Time: 09/30/22 12:06 PM  Result Value Ref Range   Glucose-Capillary 151 (H) 70 - 99 mg/dL    Comment: Glucose reference range applies only to samples taken after fasting for at least 8 hours.   No results found.    Blood pressure (!) 131/106, pulse 82, temperature (!) 96.8 F (36 C), temperature source Axillary, resp. rate 19, height 4' 11.02" (1.499 m), weight 90.3 kg, last menstrual period 06/22/2022, SpO2 96 %.  Medical Problem List and Plan: 1. 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   Antithrombotics: -DVT/anticoagulation:  Pharmaceutical: Eliquis  -antiplatelet therapy: N/A 3.Back pain/Pain Management:  Tylenol prn. Oxycodone making her nausea worse and ineffective--?? 4. Mood/Behavior/Sleep: LCSW to follow for evaluation and support.   -antipsychotic agents: N/A  -pt reports chronic anxiety. Feels depressed now  -took atarax at home tid prn. Will resume.  -begin lexapro '5mg'$  qhs  -neuropsych eval 5. Neuropsych/cognition: This patient is not capable of making decisions on her own behalf. 6. Skin/Wound Care:  Routine pressure relief  measures.  7. Fluids/Electrolytes/Nutrition: Monitor I/O. Check CMET on 03/11.  8. Acute on chronic combined CHF: HH diet.  -Monitor weights daily and for signs of overload.  --continue Demadex, Cozaar, Spironolactone, Jardiance and Lopressor 9. T2DM: Hgb A1C-10.8  (improving >15 @ 11/23). Was on Lantus 15u w/ novolog 12 units with supper  --monitor BS ac/hs and use SSI for elevated BS  --on SSI  10. Acute chronic renal failure: Encourage fluid intake. Recheck CMET on 03/11 11. Severe obesity: Educate patient on diet, compliance and exercise to help promote health and mobility.     Bary Leriche, PA-C 09/30/2022

## 2022-09-30 NOTE — Progress Notes (Signed)
Nutrition Follow-up  DOCUMENTATION CODES:   Morbid obesity  INTERVENTION:  - Continue Dys 2, Thin liquids.   NUTRITION DIAGNOSIS:   Inadequate oral intake related to inability to eat, nausea, poor appetite as evidenced by NPO status, energy intake < 75% for > or equal to 3 months.  GOAL:   Patient will meet greater than or equal to 90% of their needs  MONITOR:   TF tolerance  REASON FOR ASSESSMENT:   Consult, Ventilator Enteral/tube feeding initiation and management  ASSESSMENT:   47 y.o. female admits related to SOB. PMH includes: CHF, afib, DM, NSTEMI. Pt is currently receiving medical management related to acute on chronic combined CHF.  Meds reviewed: lipitor, colace, sliding scale insulin, cozaar, MVI, aldactone. Labs reviewed:   Pt now with Cortrak removed and diet has been advanced to Dys 2. Pt reports that she has been tolerating her diet well and ate most of her breakfast this am. Pt declines supplements at this time. RD will continue to monitor PO intakes.   Diet Order:   Diet Order             DIET DYS 2 Room service appropriate? Yes with Assist; Fluid consistency: Thin  Diet effective now                   EDUCATION NEEDS:   Not appropriate for education at this time  Skin:  Skin Assessment: Skin Integrity Issues: Skin Integrity Issues:: Incisions Incisions: right groin  Last BM:  3/7 - type 7  Height:   Ht Readings from Last 1 Encounters:  09/25/22 4' 11.02" (1.499 m)    Weight:   Wt Readings from Last 1 Encounters:  09/29/22 90.3 kg    Ideal Body Weight:     BMI:  Body mass index is 40.19 kg/m.  Estimated Nutritional Needs:   Kcal:  1100-1350 kcals  Protein:  85-115 gm  Fluid:  >/= 1.1 L  Thalia Bloodgood, RD, LDN, CNSC.

## 2022-09-30 NOTE — Discharge Summary (Incomplete)
Stroke Discharge Summary  Patient ID: [name]   MRN: [id number]      DOB: 1975/09/24  Date of Admission: 09/22/2022 Date of Discharge: 10/01/2022  Attending Physician:  Stroke, Md, MD, Stroke MD Consultant(s):   pulmonary/intensive care Patient's PCP:  Donney Dice, DO  Discharge Diagnoses: Stroke - right MCA infarct with right M2 occlusion, s/p IR with TICI2b, etiology likely embolic from known LV thrombus, cardiomyopathy with low EF and PAF even on heparin IV  Principal Problem:   Acute on chronic combined systolic (congestive) and diastolic (congestive) heart failure ([facility]) Active Problems:   Poorly controlled type 2 diabetes mellitus ([place])   Hypertension associated with diabetes ([place])   Chronic nausea   Paroxysmal atrial fibrillation ([facility])   Elevated troponin   Hypokalemia   LV (left ventricular) mural thrombus   Anasarca   Middle cerebral artery embolism, right   Acute respiratory failure with hypoxia ([place])   Cerebrovascular accident (CVA) due to embolism of right middle cerebral artery ([place])   Medications to be continued on Rehab Allergies as of 10/01/2022       Reactions   Fish Allergy Swelling   Shellfish Allergy Anaphylaxis, Swelling, Other (See Comments)   Airway involvement including EMS - Has Epi-Pen   Metformin And Related Diarrhea   Trulicity [dulaglutide] Nausea And Vomiting, Other (See Comments)   Multiple episodes of vomiting over multiple days   Nsaids Other (See Comments)   Per PCP interferes with daily meds (heart failure)   Advil [ibuprofen] Other (See Comments)   Per PCP interferes with daily meds (heart failure)        Medication List     STOP taking these medications    ferrous sulfate 325 (65 FE) MG tablet   metoCLOPramide 5 MG tablet Commonly known as: REGLAN   metoprolol succinate 25 MG 24 hr tablet Commonly known as: TOPROL-XL   sertraline 50 MG tablet Commonly known as: ZOLOFT   traZODone 50 MG tablet Commonly known as:  DESYREL   warfarin 7.5 MG tablet Commonly known as: COUMADIN       TAKE these medications    Accu-Chek Softclix Lancets lancets Use as instructed   Accu-Chek Softclix Lancets lancets Use up 4 (four) times daily as directed   acetaminophen 500 MG tablet Commonly known as: TYLENOL Take 500-1,000 mg by mouth every 6 (six) hours as needed for mild pain or headache.   apixaban 5 MG Tabs tablet Commonly known as: ELIQUIS Take 1 tablet (5 mg total) by mouth 2 (two) times daily.   atorvastatin 80 MG tablet Commonly known as: LIPITOR Take 1 tablet (80 mg total) by mouth daily.   blood glucose meter kit and supplies Use up to 4 (four) times daily as directed   empagliflozin 10 MG Tabs tablet Commonly known as: JARDIANCE Take 1 tablet (10 mg total) by mouth daily. Start taking on: October 02, 2022   hydrOXYzine 10 MG tablet Commonly known as: ATARAX Take 1 tablet (10 mg total) by mouth 3 (three) times daily as needed. What changed: reasons to take this   Lantus SoloStar 100 UNIT/ML Solostar Pen Generic drug: insulin glargine Inject 15 Units into the skin daily.   losartan 50 MG tablet Commonly known as: COZAAR Take 1 tablet (50 mg total) by mouth daily.   metoprolol tartrate 25 MG tablet Commonly known as: LOPRESSOR Take 1 tablet (25 mg total) by mouth 2 (two) times daily.   multivitamin with minerals Tabs tablet  Take 1 tablet by mouth daily. Start taking on: October 02, 2022   NovoLOG FlexPen 100 UNIT/ML FlexPen Generic drug: insulin aspart Inject 12 Units into the skin daily before supper.   OneTouch Verio test strip Generic drug: glucose blood Use up to 4 (four) times daily as directed   polyethylene glycol 17 g packet Commonly known as: MIRALAX / GLYCOLAX Take 17 g by mouth daily as needed for mild constipation.   prochlorperazine 5 MG tablet Commonly known as: COMPAZINE Take 1 tablet (5 mg total) by mouth every 6 (six) hours as needed for refractory nausea  / vomiting.   senna-docusate 8.6-50 MG tablet Commonly known as: Senokot-S Take 1 tablet by mouth at bedtime as needed for moderate constipation.   spironolactone 25 MG tablet Commonly known as: ALDACTONE Take 25 mg by mouth daily. Take 1 Tablet Daily   TechLite Pen Needles 31G X 5 MM Misc Generic drug: Insulin Pen Needle USE AS DIRECTED 2 TIMES DAILY   Torsemide 40 MG Tabs Take 40 mg by mouth daily. Start taking on: October 02, 2022 What changed: when to take this        LABORATORY STUDIES CBC    Component Value Date/Time   WBC 6.8 10/01/2022 0406   RBC 5.34 (H) 10/01/2022 0406   HGB 11.8 (L) 10/01/2022 0406   HGB 12.1 02/05/2019 1729   HCT 38.1 10/01/2022 0406   HCT 42.6 02/05/2019 1729   PLT 267 10/01/2022 0406   PLT 202 02/05/2019 1729   MCV 71.3 (L) 10/01/2022 0406   MCV 74 (L) 02/05/2019 1729   MCH 22.1 (L) 10/01/2022 0406   MCHC 31.0 10/01/2022 0406   RDW 23.3 (H) 10/01/2022 0406   RDW 16.8 (H) 02/05/2019 1729   LYMPHSABS 2.1 09/22/2022 2050   LYMPHSABS 2.4 02/05/2019 1729   MONOABS 0.5 09/22/2022 2050   EOSABS 0.0 09/22/2022 2050   EOSABS 0.0 02/05/2019 1729   BASOSABS 0.0 09/22/2022 2050   BASOSABS 0.1 02/05/2019 1729   CMP    Component Value Date/Time   NA 135 09/30/2022 0444   NA 134 02/10/2022 1543   K 3.9 09/30/2022 0444   CL 97 (L) 09/30/2022 0444   CO2 27 09/30/2022 0444   GLUCOSE 67 (L) 09/30/2022 0444   BUN 26 (H) 09/30/2022 0444   BUN 7 02/10/2022 1543   CREATININE 1.04 (H) 09/30/2022 0444   CREATININE 0.71 06/11/2015 1058   CALCIUM 8.8 (L) 09/30/2022 0444   PROT 7.4 08/05/2022 1230   PROT 7.5 02/10/2022 1543   ALBUMIN 2.8 (L) 08/05/2022 1230   ALBUMIN 3.8 (L) 02/10/2022 1543   AST 34 08/05/2022 1230   ALT 13 08/05/2022 1230   ALKPHOS 204 (H) 08/05/2022 1230   BILITOT 4.1 (H) 08/05/2022 1230   BILITOT 1.8 (H) 02/10/2022 1543   GFRNONAA >60 09/30/2022 0444   GFRAA >60 04/03/2020 1429   COAGS Lab Results  Component Value Date    INR 1.3 (H) 09/22/2022   INR 3.4 (H) 08/09/2022   INR 2.3 (H) 08/08/2022   Lipid Panel    Component Value Date/Time   CHOL 136 09/24/2022 0130   CHOL 266 (H) 10/20/2017 1054   TRIG 63 09/28/2022 0456   HDL 27 (L) 09/24/2022 0130   HDL 43 10/20/2017 1054   CHOLHDL 5.0 09/24/2022 0130   VLDL 12 09/24/2022 0130   LDLCALC 97 09/24/2022 0130   LDLCALC 195 (H) 10/20/2017 1054   HgbA1C  Lab Results  Component Value Date   HGBA1C  10.8 (H) 09/24/2022   Urinalysis    Component Value Date/Time   COLORURINE AMBER (A) 09/28/2022 1951   APPEARANCEUR HAZY (A) 09/28/2022 1951   LABSPEC 1.024 09/28/2022 1951   PHURINE 6.0 09/28/2022 1951   GLUCOSEU >=500 (A) 09/28/2022 1951   HGBUR NEGATIVE 09/28/2022 1951   BILIRUBINUR NEGATIVE 09/28/2022 1951   BILIRUBINUR NEG 04/05/2013 1057   KETONESUR NEGATIVE 09/28/2022 1951   PROTEINUR 100 (A) 09/28/2022 1951   UROBILINOGEN 0.2 08/09/2013 2031   NITRITE NEGATIVE 09/28/2022 1951   LEUKOCYTESUR MODERATE (A) 09/28/2022 1951   Urine Drug Screen     Component Value Date/Time   LABOPIA NONE DETECTED 10/18/2021 1519   COCAINSCRNUR NONE DETECTED 10/18/2021 1519   LABBENZ NONE DETECTED 10/18/2021 1519   AMPHETMU NONE DETECTED 10/18/2021 1519   THCU NONE DETECTED 10/18/2021 1519   LABBARB NONE DETECTED 10/18/2021 1519    Alcohol Level No results found for: "ETH"   SIGNIFICANT DIAGNOSTIC STUDIES DG CHEST PORT 1 VIEW  Result Date: 09/28/2022 CLINICAL DATA:  ETT EXAM: PORTABLE CHEST 1 VIEW COMPARISON:  CXR 09/24/22 FINDINGS: Interval extubation. No pneumothorax. Persistent cardiomegaly. Compared to prior exam there are likely new bilateral small pleural effusions, left-greater-than-right. There is a hazy opacity at the left lung base which could represent atelectasis or infection. Prominent bilateral interstitial opacities favored to represent pulmonary venous congestion. No radiographically apparent displaced rib fractures. Visualized upper abdomen  is unremarkable. IMPRESSION: 1. Interval extubation. 2. New bilateral small pleural effusions, left-greater-than-right. 3. New hazy opacity at the left lung base which could represent atelectasis or infection. 4. Cardiomegaly and mild pulmonary edema. Electronically Signed   By: Marin Roberts M.D.   On: 09/28/2022 08:33   IR PERCUTANEOUS ART THROMBECTOMY/INFUSION INTRACRANIAL INC DIAG ANGIO  Result Date: 09/27/2022 INDICATION: New onset right gaze deviation, and left hemiplegia. Occluded inferior division of the left middle cerebral artery mid M2 region. EXAM: 1. EMERGENT LARGE VESSEL OCCLUSION THROMBOLYSIS (anterior CIRCULATION) COMPARISON:  CT angiogram of the head and neck September 24, 2022. MEDICATIONS: Ancef 2 g IV antibiotic was administered within 1 hour of the procedure. ANESTHESIA/SEDATION: General anesthesia. CONTRAST:  Omnipaque 300 110 mL. FLUOROSCOPY TIME:  Fluoroscopy Time: 34 minutes 12 seconds (1516 mGy). COMPLICATIONS: None immediate. TECHNIQUE: Following a full explanation of the procedure along with the potential associated complications, an informed witnessed consent was obtained. The risks of intracranial hemorrhage of 10%, worsening neurological deficit, ventilator dependency, death and inability to revascularize were all reviewed in detail with the patient's father. The patient was then put under general anesthesia by the Department of Anesthesiology at [facility]. The right groin was prepped and draped in the usual sterile fashion. Thereafter using modified Seldinger technique using ultrasound guidance, transfemoral access into the right common femoral artery was obtained without difficulty. Over an 0.035 inch guidewire an 8 French 25 cm Pinnacle sheath was inserted. Through this, and also over an 0.035 inch guidewire a combination of 25 cm 5.5 [place] Berenstein support catheter inside of an 087 95 cm balloon guide catheter was advanced and positioned in the right common carotid  artery. The support catheter, and the guidewire were removed. Good aspiration was obtained from hub balloon guide catheter. A control arteriogram was performed centered extra cranially and intracranially. FINDINGS: The right common carotid arteriogram demonstrates the right external carotid artery and its major branches to be widely patent. The right internal carotid artery at the bulb to the cranial skull base is widely patent with moderate tortuosity in the mid  cervical right ICA. The petrous, the cavernous and the supraclinoid right ICA demonstrate wide patency. A right posterior communicating artery is seen opacifying the right posterior cerebral artery distribution. The right anterior cerebral artery opacifies into the capillary and venous phases. The right middle cerebral artery M1 segment is widely patent as is the superior division. Occlusion is noted of the mid M2 segment of the inferior division. PROCEDURE: Through the balloon guide catheter in the mid cervical right ICA, a combination of 160 cm microcatheter inside of a 144 cm Zoom aspiration catheter was advanced over an 014 inch standard retrieval micro guidewire with a moderate J configuration to the supraclinoid right ICA. The micro guidewire was then advanced without difficulty through the occluded inferior division into the M3 region followed the microcatheter. This was then followed by the Zoom aspiration catheter which was then engaged into the occluded segment. The micro guidewire, and the micro guidewire were removed. After aspiration of the hub of the Zoom aspiration catheter for approximately 2 minutes with proximal flow arrest, the aspiration catheter was removed. Reversal of the flow arrest, a control arteriogram performed through the balloon guide catheter in the right internal carotid artery demonstrated no change in the occluded inferior division M2 segment. Two small heterogeneous clots were noted in the retriever. A second pass was then  made again using the above combination. After obtaining safe positioning of the microcatheter in the distal M3 region, a 3 mm x 20 mm Solitaire retrieval device was then deployed in the usual manner with proximal portion recovering occluded segment of the M2 segment. At this time the Zoom aspiration catheter was advanced to engage the proximal portion of the retrieval device. Aspiration was then performed at the hub of the Zoom aspiration catheter with proximal flow arrest. The combination of the microcatheter, the retrieval device, and the aspiration catheter following reversal of flow arrest a control arteriogram performed through the balloon guide catheter in the right ICA demonstrated no significant change in the occluded M3 region of the right middle cerebral artery inferior branch. A third pass was then undertaken using the above combination, but this time a 4 mm x 40 mm Solitaire X retrieval device was deployed in the manner described above. Aspiration was performed again at the hub of the Zoom aspiration catheter and was engaged in the M2 segment proximally 2 minutes with constant aspiration the balloon guide with proximal flow arrest. The combination removed. Following reversal of flow arrest, an arteriogram demonstrated no change in the occluded M2 segment. A fourth pass was made again using the above combination. The combination was advanced into the occluded left middle cerebral artery M2 segment of the inferior division. Constant aspiration was applied at the hub of the Zoom aspiration catheter now for approximately 2 minutes following removal of the microcatheter and the micro guidewire. The Zoom aspiration catheter was removed. Following reversal of flow arrest a control arteriogram in the right ICA demonstrated no change in the occluded segment. The fifth pass was then performed using an 035 Zoom aspiration catheter advanced over an 018 inch micro guidewire with a moderate J configuration. Access was  obtained into the occluded inferior division followed by the advancement of the 035 Zoom aspiration catheter. A 3 mm x 20 mm Solitaire X retrieval device was deployed in the usual manner. With proximal flow arrest and constant aspiration at the hub of the balloon guide catheter it was retrieved and the Zoom aspiration catheter for approximately 2 minutes. The combination of  the retrieval device, the Zoom aspiration catheter was retrieved and removed. Following reversal of flow arrest, a control arteriogram performed through the balloon guide catheter in the right ICA demonstrated sliver of slow flow through the inferior division. This was not sustained. It was decided to stop the procedure due to increased risk of rupture hemorrhage. Rescue stent was considerably not from the diameter of vessel being less than 1 mm. The balloon guide catheter was removed. An 8 French Angio-Seal closure device was deployed in the right groin puncture site for hemostasis. Distal pulses remained Dopplerable in both feet unchanged. CT of the brain demonstrated no evidence of intracranial hemorrhage. It was decided to leave the patient intubated due to her medical condition. She was then transferred to the neuro ICU for post revascularization management. IMPRESSION: Status post endovascular revascularization of occluded inferior division M2 segment of the right middle cerebral artery with 1 pass with a contact aspiration, 1 pass with contact aspiration and 3 mm x 20 mm Solitaire X retrieval device, 1 pass with contact aspiration and 1 pass with a 4 mm x 40 mm Solitaire X retrieval device, 1 pass with the 3 mm x 20 mm Solitaire X retrieval device with contact aspiration and finally with 1 pass with contact aspiration without significant change achieving a TICI 2B revascularization. PLAN: As per referring MD. Electronically Signed   By: Luanne Bras M.D.   On: 09/27/2022 08:34   IR CT Head Ltd  Result Date: 09/27/2022 INDICATION:  New onset right gaze deviation, and left hemiplegia. Occluded inferior division of the left middle cerebral artery mid M2 region. EXAM: 1. EMERGENT LARGE VESSEL OCCLUSION THROMBOLYSIS (anterior CIRCULATION) COMPARISON:  CT angiogram of the head and neck September 24, 2022. MEDICATIONS: Ancef 2 g IV antibiotic was administered within 1 hour of the procedure. ANESTHESIA/SEDATION: General anesthesia. CONTRAST:  Omnipaque 300 110 mL. FLUOROSCOPY TIME:  Fluoroscopy Time: 34 minutes 12 seconds (1516 mGy). COMPLICATIONS: None immediate. TECHNIQUE: Following a full explanation of the procedure along with the potential associated complications, an informed witnessed consent was obtained. The risks of intracranial hemorrhage of 10%, worsening neurological deficit, ventilator dependency, death and inability to revascularize were all reviewed in detail with the patient's father. The patient was then put under general anesthesia by the Department of Anesthesiology at [facility]. The right groin was prepped and draped in the usual sterile fashion. Thereafter using modified Seldinger technique using ultrasound guidance, transfemoral access into the right common femoral artery was obtained without difficulty. Over an 0.035 inch guidewire an 8 French 25 cm Pinnacle sheath was inserted. Through this, and also over an 0.035 inch guidewire a combination of 25 cm 5.5 [place] Berenstein support catheter inside of an 087 95 cm balloon guide catheter was advanced and positioned in the right common carotid artery. The support catheter, and the guidewire were removed. Good aspiration was obtained from hub balloon guide catheter. A control arteriogram was performed centered extra cranially and intracranially. FINDINGS: The right common carotid arteriogram demonstrates the right external carotid artery and its major branches to be widely patent. The right internal carotid artery at the bulb to the cranial skull base is widely patent with  moderate tortuosity in the mid cervical right ICA. The petrous, the cavernous and the supraclinoid right ICA demonstrate wide patency. A right posterior communicating artery is seen opacifying the right posterior cerebral artery distribution. The right anterior cerebral artery opacifies into the capillary and venous phases. The right middle cerebral artery M1 segment  is widely patent as is the superior division. Occlusion is noted of the mid M2 segment of the inferior division. PROCEDURE: Through the balloon guide catheter in the mid cervical right ICA, a combination of 160 cm microcatheter inside of a 144 cm Zoom aspiration catheter was advanced over an 014 inch standard retrieval micro guidewire with a moderate J configuration to the supraclinoid right ICA. The micro guidewire was then advanced without difficulty through the occluded inferior division into the M3 region followed the microcatheter. This was then followed by the Zoom aspiration catheter which was then engaged into the occluded segment. The micro guidewire, and the micro guidewire were removed. After aspiration of the hub of the Zoom aspiration catheter for approximately 2 minutes with proximal flow arrest, the aspiration catheter was removed. Reversal of the flow arrest, a control arteriogram performed through the balloon guide catheter in the right internal carotid artery demonstrated no change in the occluded inferior division M2 segment. Two small heterogeneous clots were noted in the retriever. A second pass was then made again using the above combination. After obtaining safe positioning of the microcatheter in the distal M3 region, a 3 mm x 20 mm Solitaire retrieval device was then deployed in the usual manner with proximal portion recovering occluded segment of the M2 segment. At this time the Zoom aspiration catheter was advanced to engage the proximal portion of the retrieval device. Aspiration was then performed at the hub of the Zoom  aspiration catheter with proximal flow arrest. The combination of the microcatheter, the retrieval device, and the aspiration catheter following reversal of flow arrest a control arteriogram performed through the balloon guide catheter in the right ICA demonstrated no significant change in the occluded M3 region of the right middle cerebral artery inferior branch. A third pass was then undertaken using the above combination, but this time a 4 mm x 40 mm Solitaire X retrieval device was deployed in the manner described above. Aspiration was performed again at the hub of the Zoom aspiration catheter and was engaged in the M2 segment proximally 2 minutes with constant aspiration the balloon guide with proximal flow arrest. The combination removed. Following reversal of flow arrest, an arteriogram demonstrated no change in the occluded M2 segment. A fourth pass was made again using the above combination. The combination was advanced into the occluded left middle cerebral artery M2 segment of the inferior division. Constant aspiration was applied at the hub of the Zoom aspiration catheter now for approximately 2 minutes following removal of the microcatheter and the micro guidewire. The Zoom aspiration catheter was removed. Following reversal of flow arrest a control arteriogram in the right ICA demonstrated no change in the occluded segment. The fifth pass was then performed using an 035 Zoom aspiration catheter advanced over an 018 inch micro guidewire with a moderate J configuration. Access was obtained into the occluded inferior division followed by the advancement of the 035 Zoom aspiration catheter. A 3 mm x 20 mm Solitaire X retrieval device was deployed in the usual manner. With proximal flow arrest and constant aspiration at the hub of the balloon guide catheter it was retrieved and the Zoom aspiration catheter for approximately 2 minutes. The combination of the retrieval device, the Zoom aspiration catheter was  retrieved and removed. Following reversal of flow arrest, a control arteriogram performed through the balloon guide catheter in the right ICA demonstrated sliver of slow flow through the inferior division. This was not sustained. It was decided to stop the  procedure due to increased risk of rupture hemorrhage. Rescue stent was considerably not from the diameter of vessel being less than 1 mm. The balloon guide catheter was removed. An 8 French Angio-Seal closure device was deployed in the right groin puncture site for hemostasis. Distal pulses remained Dopplerable in both feet unchanged. CT of the brain demonstrated no evidence of intracranial hemorrhage. It was decided to leave the patient intubated due to her medical condition. She was then transferred to the neuro ICU for post revascularization management. IMPRESSION: Status post endovascular revascularization of occluded inferior division M2 segment of the right middle cerebral artery with 1 pass with a contact aspiration, 1 pass with contact aspiration and 3 mm x 20 mm Solitaire X retrieval device, 1 pass with contact aspiration and 1 pass with a 4 mm x 40 mm Solitaire X retrieval device, 1 pass with the 3 mm x 20 mm Solitaire X retrieval device with contact aspiration and finally with 1 pass with contact aspiration without significant change achieving a TICI 2B revascularization. PLAN: As per referring MD. Electronically Signed   By: Luanne Bras M.D.   On: 09/27/2022 08:34   DG Abd Portable 1V  Result Date: 09/26/2022 CLINICAL DATA:  Encounter for feeding tube placement EXAM: PORTABLE ABDOMEN - 1 VIEW COMPARISON:  09/24/2022 FINDINGS: Interval exchange of NG tube for feeding tube. Feeding tube within the distal stomach with tip directed towards the pylorus. IMPRESSION: Feeding tube with tip in distal stomach.  Stylet removed. Electronically Signed   By: Suzy Bouchard M.D.   On: 09/26/2022 14:54   CT HEAD WO CONTRAST (5MM)  Result Date:  09/26/2022 CLINICAL DATA:  Neuro deficit, acute, stroke suspected. EXAM: CT HEAD WITHOUT CONTRAST TECHNIQUE: Contiguous axial images were obtained from the base of the skull through the vertex without intravenous contrast. RADIATION DOSE REDUCTION: This exam was performed according to the departmental dose-optimization program which includes automated exposure control, adjustment of the mA and/or kV according to patient size and/or use of iterative reconstruction technique. COMPARISON:  CTA head/neck 09/24/2022.  MRI brain 09/25/2022. FINDINGS: Brain: Redemonstration right superior division MCA territory and distal right ACA territory infarcts. Few areas of hypoattenuation in the right posterior insula and corona radiata, consistent with petechial hemorrhage. No significant mass effect or midline shift. No hydrocephalus or extra-axial collection. Vascular: Unremarkable. Skull: No calvarial fracture or suspicious bone lesion. Skull base is unremarkable. Sinuses/Orbits: Unremarkable. Other: None. 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 No significant mass effect or midline shift. Electronically Signed   By: Emmit Alexanders M.D.   On: 09/26/2022 12:14   MR BRAIN WO CONTRAST  Result Date: 09/25/2022 CLINICAL DATA:  Stroke follow-up EXAM: MRI HEAD WITHOUT CONTRAST MRA HEAD WITHOUT CONTRAST TECHNIQUE: Multiplanar, multi-echo pulse sequences of the brain and surrounding structures were acquired without intravenous contrast. Angiographic images of the Circle of Willis were acquired using MRA technique without intravenous contrast. COMPARISON:  CT/CTA head and neck 1 day prior FINDINGS: MRI HEAD FINDINGS Brain: There is large area of diffusion restriction with associated FLAIR signal abnormality in the right MCA distribution involving the external capsule, insular cortex, temporal lobe, frontal lobe, and parietal consistent with  acute infarct. There is also a small area of infarct in the right superior frontal gyrus cortex near the vertex in the ACA distribution. Additional punctate infarcts are seen in the left cerebellar hemisphere, right caudate body/corona radiata, and right frontal lobe white matter. There  is no evidence of hemorrhagic transformation. There is swelling with sulcal effacement but no midline shift. There are punctate chronic microhemorrhages in the right caudate body and right external capsule, nonspecific. The focus in the caudate body is unchanged since 2023. Background parenchymal volume is normal. The ventricles are normal in size. There is no significant underlying chronic small-vessel ischemic change, there is a small remote infarct but in the right cerebral peduncle, unchanged since 2023. The pituitary and suprasellar region are normal. There is no solid mass lesion. Vascular: See below. Skull and upper cervical spine: Normal marrow signal. Sinuses/Orbits: The paranasal sinuses are clear. The globes and orbits are unremarkable. Other: None. MRA HEAD FINDINGS Anterior circulation: The intracranial ICAs are patent. The right M1 segment is normal. There is persistent occlusion of a dominant right M2 branch in the sylvian fissure (9-111). Patent MCA branches are seen in the frontal operculum region; however, there is otherwise minimal collateral flow in the remainder of the MCA distribution. The left M1 segment and distal branches are patent, without proximal stenosis or occlusion. The bilateral ACAs are patent, without proximal stenosis or occlusion. There is no aneurysm or AVM. Posterior circulation: The bilateral V4 segments are patent. The basilar artery is patent. The major cerebellar arteries appear patent. The bilateral PCAs are patent, primarily supplied by prominent posterior communicating arteries bilaterally (fetal origins). There is no proximal stenosis or occlusion There is no aneurysm or AVM. Anatomic  variants: As above. IMPRESSION: 1. Large area of acute infarct in the right MCA distribution, smaller area of acute infarct in the right ACA distribution, and punctate acute infarct in the left cerebellar hemisphere. No evidence of hemorrhagic transformation or midline shift. 2. Persistent occlusion of a right M2 branch with minimal collateral flow in the MCA distribution aside from in the region of the frontal operculum. Electronically Signed   By: Valetta Mole M.D.   On: 09/25/2022 15:10   MR ANGIO HEAD WO CONTRAST  Result Date: 09/25/2022 CLINICAL DATA:  Stroke follow-up EXAM: MRI HEAD WITHOUT CONTRAST MRA HEAD WITHOUT CONTRAST TECHNIQUE: Multiplanar, multi-echo pulse sequences of the brain and surrounding structures were acquired without intravenous contrast. Angiographic images of the Circle of Willis were acquired using MRA technique without intravenous contrast. COMPARISON:  CT/CTA head and neck 1 day prior FINDINGS: MRI HEAD FINDINGS Brain: There is large area of diffusion restriction with associated FLAIR signal abnormality in the right MCA distribution involving the external capsule, insular cortex, temporal lobe, frontal lobe, and parietal consistent with acute infarct. There is also a small area of infarct in the right superior frontal gyrus cortex near the vertex in the ACA distribution. Additional punctate infarcts are seen in the left cerebellar hemisphere, right caudate body/corona radiata, and right frontal lobe white matter. There is no evidence of hemorrhagic transformation. There is swelling with sulcal effacement but no midline shift. There are punctate chronic microhemorrhages in the right caudate body and right external capsule, nonspecific. The focus in the caudate body is unchanged since 2023. Background parenchymal volume is normal. The ventricles are normal in size. There is no significant underlying chronic small-vessel ischemic change, there is a small remote infarct but in the right  cerebral peduncle, unchanged since 2023. The pituitary and suprasellar region are normal. There is no solid mass lesion. Vascular: See below. Skull and upper cervical spine: Normal marrow signal. Sinuses/Orbits: The paranasal sinuses are clear. The globes and orbits are unremarkable. Other: None. MRA HEAD FINDINGS Anterior circulation: The intracranial ICAs are patent.  The right M1 segment is normal. There is persistent occlusion of a dominant right M2 branch in the sylvian fissure (9-111). Patent MCA branches are seen in the frontal operculum region; however, there is otherwise minimal collateral flow in the remainder of the MCA distribution. The left M1 segment and distal branches are patent, without proximal stenosis or occlusion. The bilateral ACAs are patent, without proximal stenosis or occlusion. There is no aneurysm or AVM. Posterior circulation: The bilateral V4 segments are patent. The basilar artery is patent. The major cerebellar arteries appear patent. The bilateral PCAs are patent, primarily supplied by prominent posterior communicating arteries bilaterally (fetal origins). There is no proximal stenosis or occlusion There is no aneurysm or AVM. Anatomic variants: As above. IMPRESSION: 1. Large area of acute infarct in the right MCA distribution, smaller area of acute infarct in the right ACA distribution, and punctate acute infarct in the left cerebellar hemisphere. No evidence of hemorrhagic transformation or midline shift. 2. Persistent occlusion of a right M2 branch with minimal collateral flow in the MCA distribution aside from in the region of the frontal operculum. Electronically Signed   By: Valetta Mole M.D.   On: 09/25/2022 15:10   ECHOCARDIOGRAM COMPLETE  Result Date: 09/25/2022    ECHOCARDIOGRAM REPORT   Patient Name:   [name] Date of Exam: 09/25/2022 Medical Rec #:  [id number]       Height:       59.0 in Accession #:    [id number]      Weight:       204.1 lb Date of Birth:   January 14, 1976        BSA:          1.861 m Patient Age:    82 years        BP:           140/108 mmHg Patient Gender: F               HR:           84 bpm. Exam Location:  Inpatient Procedure: 2D Echo STAT ECHO Indications:    acute systolic chf  History:        Patient has prior history of Echocardiogram examinations, most                 recent 02/23/2022. Cardiomyopathy, Arrythmias:Atrial Fibrillation,                 Signs/Symptoms:Shortness of Breath; Risk Factors:Diabetes,                 Hypertension, Dyslipidemia and Sleep Apnea.  Sonographer:    Johny Chess RDCS Referring Phys: [id number] Patrecia Pour STACK  Sonographer Comments: Echo performed with patient supine and on artificial respirator. IMPRESSIONS  1. 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  2. Left ventricular ejection fraction, by estimation, is 15%. The left ventricle has severely decreased function. The left ventricle demonstrates global hypokinesis. There is mild left ventricular hypertrophy. Left ventricular diastolic parameters are consistent with Grade III diastolic dysfunction (restrictive).  3. Right ventricular systolic function is moderately reduced. The right ventricular size is normal.  4. The mitral valve is grossly normal. Mild mitral valve regurgitation. No evidence of mitral stenosis.  5. Tricuspid valve regurgitation is moderate.  6. The aortic valve is tricuspid. Aortic valve regurgitation is not visualized. No aortic stenosis is present.  7. The inferior vena cava is normal in size with <50% respiratory variability, suggesting right  atrial pressure of 8 mmHg, patient currently intubated on mechanical ventilation. FINDINGS  Left Ventricle: Left ventricular ejection fraction, by estimation, is 15%. The left ventricle has severely decreased function. The left ventricle demonstrates global hypokinesis. The left ventricular internal cavity size was normal in size. There is mild left ventricular hypertrophy. Left ventricular diastolic  parameters are consistent with Grade III diastolic dysfunction (restrictive). Right Ventricle: The right ventricular size is normal. No increase in right ventricular wall thickness. Right ventricular systolic function is moderately reduced. The tricuspid regurgitant velocity is 2.68 m/s, and with an assumed right atrial pressure of 8 mmHg, the estimated right ventricular systolic pressure is 123XX123 mmHg. Left Atrium: Left atrial size was normal in size. Right Atrium: Right atrial size was normal in size. Pericardium: There is no evidence of pericardial effusion. Mitral Valve: The mitral valve is grossly normal. Mild mitral valve regurgitation. No evidence of mitral valve stenosis. Tricuspid Valve: The tricuspid valve is normal in structure. Tricuspid valve regurgitation is moderate . No evidence of tricuspid stenosis. Aortic Valve: The aortic valve is tricuspid. Aortic valve regurgitation is not visualized. No aortic stenosis is present. Pulmonic Valve: The pulmonic valve was grossly normal. Pulmonic valve regurgitation is trivial. No evidence of pulmonic stenosis. Aorta: The aortic root and ascending aorta are structurally normal, with no evidence of dilitation. Venous: IVC assessment for right atrial pressure unable to be performed due to mechanical ventilation. The inferior vena cava is normal in size with less than 50% respiratory variability, suggesting right atrial pressure of 8 mmHg. IAS/Shunts: The atrial septum is grossly normal.  LEFT VENTRICLE PLAX 2D LVIDd:         5.10 cm      Diastology LVIDs:         4.80 cm      LV e' medial:    4.03 cm/s LV PW:         1.30 cm      LV E/e' medial:  16.6 LV IVS:        1.10 cm      LV e' lateral:   3.81 cm/s LVOT diam:     1.90 cm      LV E/e' lateral: 17.5 LV SV:         15 LV SV Index:   8 LVOT Area:     2.84 cm  LV Volumes (MOD) LV vol d, MOD A4C: 119.0 ml LV vol s, MOD A4C: 100.0 ml LV SV MOD A4C:     119.0 ml RIGHT VENTRICLE            IVC RV Basal diam:  2.50 cm     IVC diam: 1.90 cm RV S prime:     5.77 cm/s TAPSE (M-mode): 1.1 cm LEFT ATRIUM             Index        RIGHT ATRIUM           Index LA diam:        3.80 cm 2.04 cm/m   RA Area:     16.10 cm LA Vol (A2C):   57.7 ml 31.01 ml/m  RA Volume:   43.20 ml  23.22 ml/m LA Vol (A4C):   53.7 ml 28.86 ml/m LA Biplane Vol: 56.3 ml 30.26 ml/m  AORTIC VALVE LVOT Vmax:   40.40 cm/s LVOT Vmean:  23.700 cm/s LVOT VTI:    0.053 m  AORTA Ao Root diam: 2.70 cm Ao Asc diam:  2.70 cm  MR Peak grad: 53.0 mmHg    TRICUSPID VALVE MR Mean grad: 38.0 mmHg    TR Peak grad:   28.7 mmHg MR Vmax:      364.00 cm/s  TR Vmax:        268.00 cm/s MR Vmean:     294.0 cm/s MV E velocity: 66.85 cm/s  SHUNTS MV A velocity: 29.56 cm/s  Systemic VTI:  0.05 m MV E/A ratio:  2.26        Systemic Diam: 1.90 cm Cherlynn Kaiser MD Electronically signed by Cherlynn Kaiser MD Signature Date/Time: 09/25/2022/11:02:17 AM    Final    DG Abd Portable 1V  Result Date: 09/24/2022 CLINICAL DATA:  OG tube placement EXAM: PORTABLE ABDOMEN - 1 VIEW COMPARISON:  None Available. FINDINGS: Enteric tube terminates in the gastric antrum. IMPRESSION: Enteric tube terminates in the gastric antrum. Electronically Signed   By: Julian Hy M.D.   On: 09/24/2022 20:23   DG CHEST PORT 1 VIEW  Result Date: 09/24/2022 CLINICAL DATA:  Status post intubation. EXAM: PORTABLE CHEST 1 VIEW COMPARISON:  09/22/2022. FINDINGS: Endotracheal tube tip is just above the carina by approximately 7 mm and is directed towards the right mainstem bronchus. Right IJ catheter tip is in the superior cavoatrial junction. No pneumothorax after catheter placement. Unchanged cardiac enlargement. No significant pleural fluid or airspace disease. Platelike atelectasis noted in the left midlung. IMPRESSION: 1. Low position of endotracheal tube with tip just above the carina and directed towards the right mainstem bronchus. Recommend retraction by 1 cm. 2. Right IJ catheter tip in the superior  cavoatrial junction. 3. Cardiac enlargement. These results will be called to the ordering clinician or representative by the Radiologist Assistant, and communication documented in the PACS or [employer]. Electronically Signed   By: Kerby Moors M.D.   On: 09/24/2022 17:50   CT HEAD WO CONTRAST (5MM)  Result Date: 09/24/2022 CLINICAL DATA:  Left-sided weakness and left gaze deviation. EXAM: CT ANGIOGRAPHY HEAD AND NECK TECHNIQUE: Multidetector CT imaging of the head and neck was performed using the standard protocol during bolus administration of intravenous contrast. Multiplanar CT image reconstructions and MIPs were obtained to evaluate the vascular anatomy. Carotid stenosis measurements (when applicable) are obtained utilizing NASCET criteria, using the distal internal carotid diameter as the denominator. RADIATION DOSE REDUCTION: This exam was performed according to the departmental dose-optimization program which includes automated exposure control, adjustment of the mA and/or kV according to patient size and/or use of iterative reconstruction technique. CONTRAST:  74m OMNIPAQUE IOHEXOL 350 MG/ML SOLN COMPARISON:  Brain MRI 11/07/2021 FINDINGS: CT HEAD FINDINGS Brain: There is questionable loss of gray-white differentiation in the posterior insula suspicious for early infarct in the MCA distribution. The remainder of the MCA territory appears spared at this time. ASPECTS is 9. There is no acute intracranial hemorrhage or extra-axial fluid collection. Background parenchymal volume is normal. The ventricles are normal in size. Probable prominent perivascular spaces are noted in the bilateral basal ganglia. The pituitary and suprasellar region are normal. There is no mass lesion. There is no mass effect or midline shift. Vascular: See below. Skull: Normal. Negative for fracture or focal lesion. Sinuses/Orbits: There is mild mucosal thickening in the paranasal sinuses. There is a rightward gaze  deviation. Other: None. Review of the MIP images confirms the above findings CTA NECK FINDINGS Aortic arch: The imaged aortic arch is normal. The origins of the major branch vessels are patent. The subclavian arteries are patent to the level  imaged. Right carotid system: The right common, internal, and external carotid arteries are patent with mild plaque in the common carotid artery and at the bifurcation but no hemodynamically significant stenosis or occlusion. There is no evidence of dissection or aneurysm. Left carotid system: The left common, internal, and external carotid arteries are patent with mild plaque at the bifurcation but no hemodynamically significant stenosis or occlusion. There is no evidence of dissection or aneurysm Vertebral arteries: The vertebral arteries are patent, without hemodynamically significant stenosis or occlusion. There is no evidence of dissection or aneurysm. Skeleton: There is no acute osseous abnormality or suspicious osseous lesion. There is no visible canal hematoma. Other neck: The soft tissues of the neck are unremarkable. Upper chest: There is an incompletely imaged right pleural effusion. The imaged lung apices are otherwise clear. Review of the MIP images confirms the above findings CTA HEAD FINDINGS Anterior circulation: The intracranial ICAs are patent. The right M1 segment is patent. Small patent MCA branches are seen in the region of the frontal operculum; however, there is essentially no other collateral flow in the remainder of the MCA distribution. The left M1 segment and distal branches are patent, without proximal stenosis or occlusion. The left A1 segment is relatively hypoplastic, likely developmental. The dominant right A1 segment is patent. The distal ACA branches are patent, without proximal stenosis or occlusion. There is no aneurysm or AVM. Posterior circulation: The bilateral V4 segments are patent. The basilar artery is patent, diminutive in caliber. The  bilateral PCAs are patent, supplied by posterior communicating arteries bilaterally (fetal origins). There is no aneurysm or AVM. Venous sinuses: Not well evaluated due to bolus timing. Anatomic variants: As above. Review of the MIP images confirms the above findings IMPRESSION: 1. Loss of gray-white differentiation in the insula likely reflecting early infarct. Aspects is 9. 2. Proximal right M2 occlusion with minimal collateral flow throughout the MCA distribution. 3. Patent vasculature of the neck with no hemodynamically significant stenosis or occlusion. Electronically Signed   By: Valetta Mole M.D.   On: 09/24/2022 14:06   CT ANGIO HEAD NECK W WO CM (CODE STROKE)  Result Date: 09/24/2022 CLINICAL DATA:  Left-sided weakness and left gaze deviation. EXAM: CT ANGIOGRAPHY HEAD AND NECK TECHNIQUE: Multidetector CT imaging of the head and neck was performed using the standard protocol during bolus administration of intravenous contrast. Multiplanar CT image reconstructions and MIPs were obtained to evaluate the vascular anatomy. Carotid stenosis measurements (when applicable) are obtained utilizing NASCET criteria, using the distal internal carotid diameter as the denominator. RADIATION DOSE REDUCTION: This exam was performed according to the departmental dose-optimization program which includes automated exposure control, adjustment of the mA and/or kV according to patient size and/or use of iterative reconstruction technique. CONTRAST:  61m OMNIPAQUE IOHEXOL 350 MG/ML SOLN COMPARISON:  Brain MRI 11/07/2021 FINDINGS: CT HEAD FINDINGS Brain: There is questionable loss of gray-white differentiation in the posterior insula suspicious for early infarct in the MCA distribution. The remainder of the MCA territory appears spared at this time. ASPECTS is 9. There is no acute intracranial hemorrhage or extra-axial fluid collection. Background parenchymal volume is normal. The ventricles are normal in size. Probable  prominent perivascular spaces are noted in the bilateral basal ganglia. The pituitary and suprasellar region are normal. There is no mass lesion. There is no mass effect or midline shift. Vascular: See below. Skull: Normal. Negative for fracture or focal lesion. Sinuses/Orbits: There is mild mucosal thickening in the paranasal sinuses. There is a rightward  gaze deviation. Other: None. Review of the MIP images confirms the above findings CTA NECK FINDINGS Aortic arch: The imaged aortic arch is normal. The origins of the major branch vessels are patent. The subclavian arteries are patent to the level imaged. Right carotid system: The right common, internal, and external carotid arteries are patent with mild plaque in the common carotid artery and at the bifurcation but no hemodynamically significant stenosis or occlusion. There is no evidence of dissection or aneurysm. Left carotid system: The left common, internal, and external carotid arteries are patent with mild plaque at the bifurcation but no hemodynamically significant stenosis or occlusion. There is no evidence of dissection or aneurysm Vertebral arteries: The vertebral arteries are patent, without hemodynamically significant stenosis or occlusion. There is no evidence of dissection or aneurysm. Skeleton: There is no acute osseous abnormality or suspicious osseous lesion. There is no visible canal hematoma. Other neck: The soft tissues of the neck are unremarkable. Upper chest: There is an incompletely imaged right pleural effusion. The imaged lung apices are otherwise clear. Review of the MIP images confirms the above findings CTA HEAD FINDINGS Anterior circulation: The intracranial ICAs are patent. The right M1 segment is patent. Small patent MCA branches are seen in the region of the frontal operculum; however, there is essentially no other collateral flow in the remainder of the MCA distribution. The left M1 segment and distal branches are patent, without  proximal stenosis or occlusion. The left A1 segment is relatively hypoplastic, likely developmental. The dominant right A1 segment is patent. The distal ACA branches are patent, without proximal stenosis or occlusion. There is no aneurysm or AVM. Posterior circulation: The bilateral V4 segments are patent. The basilar artery is patent, diminutive in caliber. The bilateral PCAs are patent, supplied by posterior communicating arteries bilaterally (fetal origins). There is no aneurysm or AVM. Venous sinuses: Not well evaluated due to bolus timing. Anatomic variants: As above. Review of the MIP images confirms the above findings IMPRESSION: 1. Loss of gray-white differentiation in the insula likely reflecting early infarct. Aspects is 9. 2. Proximal right M2 occlusion with minimal collateral flow throughout the MCA distribution. 3. Patent vasculature of the neck with no hemodynamically significant stenosis or occlusion. Electronically Signed   By: Valetta Mole M.D.   On: 09/24/2022 14:06   CT Angio Chest PE W and/or Wo Contrast  Result Date: 09/22/2022 CLINICAL DATA:  Pulmonary embolism suspected, high probability. Bilateral lower extremity edema and shortness of breath. EXAM: CT ANGIOGRAPHY CHEST WITH CONTRAST TECHNIQUE: Multidetector CT imaging of the chest was performed using the standard protocol during bolus administration of intravenous contrast. Multiplanar CT image reconstructions and MIPs were obtained to evaluate the vascular anatomy. RADIATION DOSE REDUCTION: This exam was performed according to the departmental dose-optimization program which includes automated exposure control, adjustment of the mA and/or kV according to patient size and/or use of iterative reconstruction technique. CONTRAST:  120m OMNIPAQUE IOHEXOL 350 MG/ML SOLN COMPARISON:  05/06/2022. FINDINGS: Cardiovascular: The heart is enlarged and there is no pericardial effusion. Scattered coronary artery calcifications are noted. There is  atherosclerotic calcification of the aorta without evidence of aneurysm. The pulmonary trunk is normal in caliber. No pulmonary artery filling defect is seen. Mediastinum/Nodes: No mediastinal, hilar, or axillary lymphadenopathy by size criteria. The thyroid gland, trachea, and esophagus are within normal limits. Lungs/Pleura: There is a small right pleural effusion. Mild central bronchial wall thickening is noted bilaterally. Atelectasis is noted bilaterally. No pneumothorax. Upper Abdomen: No acute abnormality. Musculoskeletal:  Anasarca is noted. Degenerative changes are present in the thoracic spine. No acute osseous abnormality. Review of the MIP images confirms the above findings. IMPRESSION: 1. No evidence of pulmonary embolism. 2. Mild central bronchial wall thickening bilaterally which may be infectious or inflammatory. 3. Small right pleural effusion. 4. Cardiomegaly with coronary artery calcifications. 5. Aortic atherosclerosis. 6. Anasarca. Electronically Signed   By: Brett Fairy M.D.   On: 09/22/2022 23:25   DG Chest 2 View  Result Date: 09/22/2022 CLINICAL DATA:  Shortness of breath, heart failure, extremity swelling. EXAM: CHEST - 2 VIEW COMPARISON:  08/13/2022. FINDINGS: Heart is enlarged and the mediastinal contour is within normal limits. The pulmonary vasculature is mildly distended. Subsegmental atelectasis is noted in the mid left lung. No effusion or pneumothorax. No acute osseous abnormality. IMPRESSION: Cardiomegaly with mild pulmonary vascular congestion. Electronically Signed   By: Brett Fairy M.D.   On: 09/22/2022 20:29       HISTORY OF PRESENT ILLNESS Patient with a history of CHF, LV thrombus on Coumadin with questionable compliance, non-STEMI, PE, A-fib, type 2 diabetes, hypertension, depression and sleep apnea presented originally with 2-week history of bilateral lower extremity edema, shortness of breath and chest pain.  She was admitted for CHF exacerbation, and on 3/2 she  developed sudden onset left-sided weakness.  HOSPITAL COURSE Patient was found to be having acute stroke due to right M2 occlusion and underwent mechanical thrombectomy in IR.  TICI 2B flow was achieved.  She had a brief cardiac arrest during the procedure but was resuscitated after 2 minutes.  She was initially on heparin drip but now has been changed to Eliquis, which she will need to take indefinitely for cardiac thrombus and atrial fibrillation.  She is doing well and is ready to be discharged to rehabilitation.   Stroke - right MCA infarct with right M2 occlusion, s/p IR with TICI2b, etiology likely embolic from known LV thrombus, cardiomyopathy with low EF and PAF even on heparin IV Code Stroke CT head: Loss of gray-white differentiation in the insula likely reflecting early infarct.  Aspects is 9 CTA head & neck:  Proximal right M2 occlusion with minimal collateral flow throughout the MCA distribution. IR - Occluded inferior division of the right middle cerebral artery in the mid M2 segment. TICI 2B revascularization.  Post IR CT: no evidence of intracranial hemorrhage.  MRI: Large area of acute infarct in the right MCA distribution, smaller area of acute infarct in the right ACA distribution, and punctate acute infarct in the left cerebellar hemisphere. No evidence of hemorrhagic transformation or midline shift. MRA head - Persistent occlusion of a right M2 branch with minimal collateral flow in the MCA distribution aside from in the region of the frontal operculum. CT repeat 3/4 stable right superior division MCA territory and distal right ACA territory infarcts with petechial hemorrhage. 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 Strict Intake/Output, Foley catheter, daily weights Resume Toprol and spironolactone 3/4 Resume Losartan '50mg'$  on 3/6 Resume Jardiance 3/7   Paroxysmal Atrial Fibrillation Heparin gtt -> Eliquis '5mg'$  BID Continuous telemetry monitoring   Hx of hypertension hypotension Stable on the low end Off Neo Resumed Toprol '25mg'$  BID, and cozaar '50mg'$  Torsemid 40, Spironolactone '25mg'$  BP goal 120-180 Long-term BP goal normotensive   Hyperlipidemia Home meds:  none LDL 97, goal < 70 on lipitor '80mg'$   Continue statin at discharge   Diabetes type II Uncontrolled Home meds:  novolog flexpen, and Jardiance HgbA1c 10.8, goal < 7.0 CBG  SSI Jardiance '10mg'$  Close PCP follow up for better DM control   Dysphagia  Passed swallow Speech on board- on dysphagia diet   Other Stroke Risk Factors Obesity, Body mass index is 40.19 kg/m., BMI >/= 30 associated with increased stroke risk, recommend weight loss, diet and exercise as appropriate  Coronary artery disease / NSTEMI Migraines Obstructive sleep apnea   Other Active Problems Hypokalemia, resolved. Goal >4 Hypomagnesia, resolved. [place] >2.  AKI Cre 1.04->1.14->1.18->0.92->1.02->0.92 Leukocytosis WBC 10.3->12.4->13.6->10.5->7.9, UA WBC 11-20 Back pain - oxyc causing nausea -> change to MSIR PRN  DISCHARGE EXAM Blood pressure (!)  126/102, pulse 74, temperature 97.7 F (36.5 C), temperature source Oral, resp. rate 18, height 4' 11.02" (1.499 m), weight 89.9 kg, last menstrual period 06/22/2022, SpO2 99 %. General: Alert, well-nourished, well-developed patient in no acute distress Respiratory: Regular, unlabored respirations on room air  NEURO:  Mental Status: AA&Ox3  Speech/Language: speech is with mild dysarthria  Cranial Nerves:  II: PERRL.  III, IV, VI: Right gaze preference but can cross midline barely V: Sensation is intact to light touch and diminished on the left VII: Slight left facial droop VIII: hearing intact to voice. IX, X: Voice is quiet and slightly dysarthric XII: tongue is midline without fasciculations. Motor: 5/5 strength to right upper and lower extremities, 0 out of 5 strength to left upper extremity, 1 out of 5 strength to left lower extremity Tone: is normal and bulk is normal Sensation- Intact to light touch bilaterally but diminished on the left Gait- deferred   Discharge Diet      Diet   DIET DYS 3 Room service appropriate? Yes with Assist; Fluid consistency: Thin   liquids  DISCHARGE PLAN Disposition:  Transfer to [facility] for ongoing PT, OT and ST Eliquis (apixaban) daily for secondary stroke prevention indefinitely Recommend ongoing stroke risk factor control by Primary Care Physician at time of discharge from inpatient rehabilitation. Follow-up PCP Ganta, Anupa, DO in 2 weeks following discharge from rehab. Follow-up in [facility] in 8 weeks following discharge from rehab, office to schedule an appointment.   25 minutes were spent preparing discharge.  [place] , MSN, AGACNP-BC [facility] See Amion for schedule and pager information 10/01/2022 12:22 PM    ATTENDING ATTESTATION:  Dr. Reeves Forth evaluated pt independently, reviewed imaging, chart, labs. Discussed and formulated plan with the  Resident/APP. Changes were made to the note where appropriate. Please see APP/resident note above for details.     MDM: Moderate/High. Pertinent labs, imaging results reviewed by me and considered in my decision making. Independently reviewed imaging. Medical records reviewed. Discussed the patient with another medical provider/personnel.   Gaurang Palikh,MD

## 2022-09-30 NOTE — Progress Notes (Addendum)
STROKE TEAM PROGRESS NOTE   INTERVAL HISTORY Her father is at the bedside. Pt reclining in bed, still lethargic but AAO x 3, speaking in much louder voice, wanted to go home. Stated that she was on coumadin but was discontinued by physician. Pharmacy recorder showed she was noncompliant with coumadin and missing INR checks. Still having tingling on the left. Start eliquis today if cardiology is on board. Plan to discharge to CIR Sunday.  Final Cardiology Recs -Continue losartan 50 mg daily, Toprol tartrate 25 mg BID, spironolactone 25 mg daily, jardiance 10 mg daily. Continue torsemide 40 mg daily - continue eliquis 5 mg BID indefinitely  Vitals:   09/29/22 1900 09/29/22 2000 09/30/22 0000 09/30/22 0400  BP:  (!) 145/112 116/86 107/88  Pulse: 89 89 82 73  Resp: '16 19 15 12  '$ Temp:  98.9 F (37.2 C) 98.6 F (37 C) 98 F (36.7 C)  TempSrc:  Oral Oral Oral  SpO2: 95% 96% 93% 95%  Weight:      Height:       CBC:  Recent Labs  Lab 09/29/22 0453 09/30/22 0444  WBC 10.5 7.9  HGB 11.6* 11.8*  HCT 37.6 36.0  MCV 72.7* 70.0*  PLT 260 [id number]    Basic Metabolic Panel:  Recent Labs  Lab 09/27/22 0459 09/27/22 1746 09/28/22 0456 09/29/22 0453 09/30/22 0444  NA 135  --    < > 136 135  K 4.1  --    < > 3.9 3.9  CL 100  --    < > 98 97*  CO2 26  --    < > 27 27  GLUCOSE 138*  --    < > 172* 67*  BUN 21*  --    < > 30* 26*  CREATININE 0.92  --    < > 0.92 1.04*  CALCIUM 8.3*  --    < > 8.8* 8.8*  MG 2.3 2.3  --   --   --   PHOS 4.3 3.9  --   --   --    < > = values in this interval not displayed.    Lipid Panel:  Recent Labs  Lab 09/24/22 0130 09/25/22 0508 09/28/22 0456  CHOL 136  --   --   TRIG 62   < > 63  HDL 27*  --   --   CHOLHDL 5.0  --   --   VLDL 12  --   --   LDLCALC 97  --   --    < > = values in this interval not displayed.    HgbA1c:  Recent Labs  Lab 09/24/22 0130  HGBA1C 10.8*    Urine Drug Screen: No results for input(s): "LABOPIA",  "COCAINSCRNUR", "LABBENZ", "AMPHETMU", "THCU", "LABBARB" in the last 168 hours.  Alcohol Level No results for input(s): "ETH" in the last 168 hours.  IMAGING past 24 hours No results found.  PHYSICAL EXAM  Temp:  [97.6 F (36.4 C)-98.9 F (37.2 C)] 98 F (36.7 C) (03/08 0400) Pulse Rate:  [73-90] 73 (03/08 0400) Resp:  [0-23] 12 (03/08 0400) BP: (107-145)/(86-112) 107/88 (03/08 0400) SpO2:  [91 %-98 %] 95 % (03/08 0400)   General - Well nourished, well developed, lethargic but not in distress.   Ophthalmologic - fundi not visualized due to noncooperation.   Cardiovascular - Regular rate and rhythm, not in afib.   Neuro - lethargic but continue to improve, awake alert, eyes open, following simple commands. Voice  louder and breathing without difficulty. Eyes right gaze preference, barely cross midline, blinking to visual threat on the right but not on the left. PERRL. Left facial droop and tongue protrusion midline. RUE 3/5. LUE flaccid. RLE withdraw to pain. LLE in boot, flicker movement with painful stimuli. Sensation, coordination not very cooperative and gait not tested.     ASSESSMENT/PLAN [name] is a 47 y.o. female with extensive PMH that includes but not limited to Acute combined systolic and diastolic congestive heart failure, history of LV thrombus, prior NSTEMMI, prior PE, A-fib, type 2 diabetes, HTN, depression, and OSA who presented 3/1 with 2 week history of lower extremity edema, shortness of breath, and chest pain.  Neurology was consulted for code stroke 3/2 due to left-sided weakness.  Patient underwent IR procedure due to right M2 occlusion she remained intubated postprocedure.  Stroke workup as detailed below.  3/3 MRI negative for hemorrhage, will restart heparin drip.  Will plan for SBT and hopeful extubation early tomorrow a.m.  Appreciate CCM assistance with this.  Stroke - right MCA infarct with right M2 occlusion, s/p IR with TICI2b, etiology likely  embolic from known LV thrombus, cardiomyopathy with low EF and PAF even on heparin IV Code Stroke CT head: Loss of gray-white differentiation in the insula likely reflecting early infarct.  Aspects is 9 CTA head & neck:  Proximal right M2 occlusion with minimal collateral flow throughout the MCA distribution. IR - Occluded inferior division of the right middle cerebral artery in the mid M2 segment. TICI 2B revascularization.  Post IR CT: no evidence of intracranial hemorrhage.  MRI: Large area of acute infarct in the right MCA distribution, smaller area of acute infarct in the right ACA distribution, and punctate acute infarct in the left cerebellar hemisphere. No evidence of hemorrhagic transformation or midline shift. MRA head - Persistent occlusion of a right M2 branch with minimal collateral flow in the MCA distribution aside from in the region of the frontal operculum. CT repeat 3/4 stable right superior division MCA territory and distal right ACA territory infarcts with petechial hemorrhage. 2D Echo: Large LV apical thrombus present, LVEF 15% LDL 97 HgbA1c 10.8 VTE prophylaxis - SCDs, Eliquis warfarin daily prior to admission (?compliance), now on eliquis '5mg'$  BID (heparin off 3/8)  Therapy recommendations:  CIR Disposition:  pending  Brief Cardiac Arrest during IR procedure 71mns of ACLS before ROSC achieved Cardiology/CCM on board  Acute Respiratory Failure Self extubated 3/5 Tolerating well so far Close monitoring Appreciate CCM assistance Leukocytosis WBC 10.3->12.4->13.6->10.5->7.9 CXR L>R pleural effusion and new left lung base hazy opacity Afebrile   Acute on chronic combined congestive heart failure Nonischemic cardiomyopathy LV thrombus 2D Echo: Large LV apical thrombus present, LVEF 15% Subtherapeutic INR on admission Resumed Heparin gtt, will switch to eliquis once appropriate per cardiology Cardiology on board R/L cath 08/2021 showed mild, nonobstructive CAD CXR  with cardiomegaly and mild pulmonary vascular congestion. Strict Intake/Output, Foley catheter, daily weights Resume Toprol and spironolactone 3/4 Resume Losartan '50mg'$  on 3/6 Resume Jardiance 3/7  Paroxysmal Atrial Fibrillation Heparin gtt -> Eliquis '5mg'$  BID Continuous telemetry monitoring  Hx of hypertension hypotension Stable on the low end Off Neo Resumed Toprol '25mg'$  BID, and cozaar '50mg'$  Torsemid 40, Spironolactone '25mg'$  BP goal 120-180 Long-term BP goal normotensive  Hyperlipidemia Home meds:  none LDL 97, goal < 70 on lipitor '80mg'$   Continue statin at discharge  Diabetes type II Uncontrolled Home meds:  novolog flexpen, and Jardiance HgbA1c 10.8, goal <  7.0 CBG  SSI Jardiance '10mg'$  Close PCP follow up for better DM control  Dysphagia  Passed swallow Speech on board- on dysphagia diet  Other Stroke Risk Factors Obesity, Body mass index is 40.19 kg/m., BMI >/= 30 associated with increased stroke risk, recommend weight loss, diet and exercise as appropriate  Coronary artery disease / NSTEMI Migraines Obstructive sleep apnea  Other Active Problems Hypokalemia, resolved. Goal >4 Hypomagnesia, resolved. [place] >2.  AKI Cre 1.04->1.14->1.18->0.92->1.02->0.92 Leukocytosis WBC 10.3->12.4->13.6->10.5->7.9, UA WBC 11-20 Back pain - oxyc causing nausea -> change to [facility] PRN  Hospital day # 7  Patient seen and examined by NP/APP with MD. MD to update note as needed.   Janine Ores, DNP, FNP-BC [facility] Pager: [phone]  ATTENDING NOTE: I reviewed above note and agree with the assessment and plan. Pt was seen and examined.   Father at the bedside. Pt lethargic, reclining in bed, but AAO x 3, no aphasia, fluent language, hypophonia has resolved, clear voice, no significant dysarthria, right gaze preference but able to have left gaze now, right hemiplegia. Cardiology on board, agree with eliquis, continue GDMT. Pending CIR likely Sunday. Pain control  with MSIR.   For detailed assessment and plan, please refer to above/below as I have made changes wherever appropriate.   Rosalin Hawking, MD PhD Stroke Neurology 09/30/2022 6:36 PM  This patient is critically ill due to right MCA stroke, status post mechanical thrombectomy, LV thrombus, A-fib, cardiomyopathy, respiratory failure and at significant risk of neurological worsening, death form recurrent stroke, hemorrhagic transformation, heart failure, arrhythmia, seizure. This patient's care requires constant monitoring of vital signs, hemodynamics, respiratory and cardiac monitoring, review of multiple databases, neurological assessment, discussion with family, other specialists and medical decision making of high complexity. I spent 35 minutes of neurocritical care time in the care of this patient. I had long discussion with father at bedside, updated pt current condition, treatment plan and potential prognosis, and answered all the questions. He expressed understanding and appreciation.   To contact Stroke Continuity provider, please refer to [email or web address] After hours, contact General Neurology

## 2022-10-01 ENCOUNTER — Encounter (HOSPITAL_COMMUNITY): Payer: Self-pay | Admitting: Physical Medicine and Rehabilitation

## 2022-10-01 ENCOUNTER — Inpatient Hospital Stay (HOSPITAL_COMMUNITY)
Admission: RE | Admit: 2022-10-01 | Discharge: 2022-11-01 | DRG: 056 | Disposition: A | Discharge status: Home Health | Payer: Medicare Other | Source: Intra-hospital | Attending: Physical Medicine and Rehabilitation | Admitting: Physical Medicine and Rehabilitation

## 2022-10-01 ENCOUNTER — Other Ambulatory Visit (HOSPITAL_COMMUNITY): Payer: Self-pay

## 2022-10-01 ENCOUNTER — Other Ambulatory Visit: Payer: Self-pay

## 2022-10-01 DIAGNOSIS — G8929 Other chronic pain: Secondary | ICD-10-CM | POA: Diagnosis present

## 2022-10-01 DIAGNOSIS — I428 Other cardiomyopathies: Secondary | ICD-10-CM | POA: Diagnosis present

## 2022-10-01 DIAGNOSIS — Z794 Long term (current) use of insulin: Secondary | ICD-10-CM | POA: Diagnosis not present

## 2022-10-01 DIAGNOSIS — G4733 Obstructive sleep apnea (adult) (pediatric): Secondary | ICD-10-CM | POA: Diagnosis present

## 2022-10-01 DIAGNOSIS — I130 Hypertensive heart and chronic kidney disease with heart failure and stage 1 through stage 4 chronic kidney disease, or unspecified chronic kidney disease: Secondary | ICD-10-CM | POA: Diagnosis present

## 2022-10-01 DIAGNOSIS — Z888 Allergy status to other drugs, medicaments and biological substances status: Secondary | ICD-10-CM

## 2022-10-01 DIAGNOSIS — F419 Anxiety disorder, unspecified: Secondary | ICD-10-CM | POA: Diagnosis present

## 2022-10-01 DIAGNOSIS — R4587 Impulsiveness: Secondary | ICD-10-CM | POA: Diagnosis present

## 2022-10-01 DIAGNOSIS — I252 Old myocardial infarction: Secondary | ICD-10-CM

## 2022-10-01 DIAGNOSIS — K5901 Slow transit constipation: Secondary | ICD-10-CM | POA: Diagnosis not present

## 2022-10-01 DIAGNOSIS — K5900 Constipation, unspecified: Secondary | ICD-10-CM | POA: Diagnosis present

## 2022-10-01 DIAGNOSIS — E1165 Type 2 diabetes mellitus with hyperglycemia: Secondary | ICD-10-CM | POA: Diagnosis present

## 2022-10-01 DIAGNOSIS — M79604 Pain in right leg: Secondary | ICD-10-CM | POA: Diagnosis present

## 2022-10-01 DIAGNOSIS — Z6837 Body mass index (BMI) 37.0-37.9, adult: Secondary | ICD-10-CM

## 2022-10-01 DIAGNOSIS — N179 Acute kidney failure, unspecified: Secondary | ICD-10-CM | POA: Diagnosis present

## 2022-10-01 DIAGNOSIS — Z741 Need for assistance with personal care: Secondary | ICD-10-CM | POA: Diagnosis present

## 2022-10-01 DIAGNOSIS — F329 Major depressive disorder, single episode, unspecified: Secondary | ICD-10-CM

## 2022-10-01 DIAGNOSIS — H518 Other specified disorders of binocular movement: Secondary | ICD-10-CM | POA: Insufficient documentation

## 2022-10-01 DIAGNOSIS — I480 Paroxysmal atrial fibrillation: Secondary | ICD-10-CM | POA: Diagnosis present

## 2022-10-01 DIAGNOSIS — F330 Major depressive disorder, recurrent, mild: Secondary | ICD-10-CM

## 2022-10-01 DIAGNOSIS — E11649 Type 2 diabetes mellitus with hypoglycemia without coma: Secondary | ICD-10-CM | POA: Diagnosis present

## 2022-10-01 DIAGNOSIS — I69391 Dysphagia following cerebral infarction: Secondary | ICD-10-CM

## 2022-10-01 DIAGNOSIS — Z79899 Other long term (current) drug therapy: Secondary | ICD-10-CM | POA: Diagnosis not present

## 2022-10-01 DIAGNOSIS — I10 Essential (primary) hypertension: Secondary | ICD-10-CM | POA: Diagnosis not present

## 2022-10-01 DIAGNOSIS — I63511 Cerebral infarction due to unspecified occlusion or stenosis of right middle cerebral artery: Secondary | ICD-10-CM | POA: Diagnosis present

## 2022-10-01 DIAGNOSIS — R630 Anorexia: Secondary | ICD-10-CM | POA: Diagnosis not present

## 2022-10-01 DIAGNOSIS — R110 Nausea: Secondary | ICD-10-CM | POA: Diagnosis present

## 2022-10-01 DIAGNOSIS — Z8249 Family history of ischemic heart disease and other diseases of the circulatory system: Secondary | ICD-10-CM

## 2022-10-01 DIAGNOSIS — I69354 Hemiplegia and hemiparesis following cerebral infarction affecting left non-dominant side: Secondary | ICD-10-CM | POA: Diagnosis present

## 2022-10-01 DIAGNOSIS — E1169 Type 2 diabetes mellitus with other specified complication: Secondary | ICD-10-CM | POA: Diagnosis present

## 2022-10-01 DIAGNOSIS — E785 Hyperlipidemia, unspecified: Secondary | ICD-10-CM | POA: Diagnosis present

## 2022-10-01 DIAGNOSIS — G4700 Insomnia, unspecified: Secondary | ICD-10-CM | POA: Diagnosis present

## 2022-10-01 DIAGNOSIS — Z886 Allergy status to analgesic agent status: Secondary | ICD-10-CM

## 2022-10-01 DIAGNOSIS — I513 Intracardiac thrombosis, not elsewhere classified: Secondary | ICD-10-CM | POA: Diagnosis present

## 2022-10-01 DIAGNOSIS — Z91148 Patient's other noncompliance with medication regimen for other reason: Secondary | ICD-10-CM

## 2022-10-01 DIAGNOSIS — E1122 Type 2 diabetes mellitus with diabetic chronic kidney disease: Secondary | ICD-10-CM | POA: Diagnosis present

## 2022-10-01 DIAGNOSIS — G8194 Hemiplegia, unspecified affecting left nondominant side: Secondary | ICD-10-CM | POA: Insufficient documentation

## 2022-10-01 DIAGNOSIS — I5042 Chronic combined systolic (congestive) and diastolic (congestive) heart failure: Secondary | ICD-10-CM | POA: Diagnosis not present

## 2022-10-01 DIAGNOSIS — R52 Pain, unspecified: Secondary | ICD-10-CM | POA: Diagnosis not present

## 2022-10-01 DIAGNOSIS — I69322 Dysarthria following cerebral infarction: Secondary | ICD-10-CM

## 2022-10-01 DIAGNOSIS — M7989 Other specified soft tissue disorders: Secondary | ICD-10-CM | POA: Diagnosis not present

## 2022-10-01 DIAGNOSIS — M549 Dorsalgia, unspecified: Secondary | ICD-10-CM | POA: Diagnosis present

## 2022-10-01 DIAGNOSIS — E876 Hypokalemia: Secondary | ICD-10-CM | POA: Diagnosis present

## 2022-10-01 DIAGNOSIS — I6931 Attention and concentration deficit following cerebral infarction: Secondary | ICD-10-CM

## 2022-10-01 DIAGNOSIS — R739 Hyperglycemia, unspecified: Secondary | ICD-10-CM | POA: Diagnosis not present

## 2022-10-01 DIAGNOSIS — R252 Cramp and spasm: Secondary | ICD-10-CM | POA: Diagnosis not present

## 2022-10-01 DIAGNOSIS — N189 Chronic kidney disease, unspecified: Secondary | ICD-10-CM | POA: Diagnosis present

## 2022-10-01 DIAGNOSIS — Z7409 Other reduced mobility: Secondary | ICD-10-CM | POA: Diagnosis present

## 2022-10-01 DIAGNOSIS — F5101 Primary insomnia: Secondary | ICD-10-CM

## 2022-10-01 DIAGNOSIS — R32 Unspecified urinary incontinence: Secondary | ICD-10-CM | POA: Diagnosis present

## 2022-10-01 DIAGNOSIS — E559 Vitamin D deficiency, unspecified: Secondary | ICD-10-CM | POA: Diagnosis present

## 2022-10-01 DIAGNOSIS — Z7901 Long term (current) use of anticoagulants: Secondary | ICD-10-CM

## 2022-10-01 DIAGNOSIS — Z833 Family history of diabetes mellitus: Secondary | ICD-10-CM

## 2022-10-01 DIAGNOSIS — I5043 Acute on chronic combined systolic (congestive) and diastolic (congestive) heart failure: Secondary | ICD-10-CM | POA: Diagnosis present

## 2022-10-01 DIAGNOSIS — E669 Obesity, unspecified: Secondary | ICD-10-CM

## 2022-10-01 DIAGNOSIS — Z91013 Allergy to seafood: Secondary | ICD-10-CM

## 2022-10-01 DIAGNOSIS — Z8674 Personal history of sudden cardiac arrest: Secondary | ICD-10-CM

## 2022-10-01 LAB — GLUCOSE, CAPILLARY
Glucose-Capillary: 110 mg/dL — ABNORMAL HIGH (ref 70–99)
Glucose-Capillary: 121 mg/dL — ABNORMAL HIGH (ref 70–99)
Glucose-Capillary: 127 mg/dL — ABNORMAL HIGH (ref 70–99)
Glucose-Capillary: 129 mg/dL — ABNORMAL HIGH (ref 70–99)
Glucose-Capillary: 135 mg/dL — ABNORMAL HIGH (ref 70–99)
Glucose-Capillary: 142 mg/dL — ABNORMAL HIGH (ref 70–99)

## 2022-10-01 LAB — CBC
HCT: 38.1 % (ref 36.0–46.0)
Hemoglobin: 11.8 g/dL — ABNORMAL LOW (ref 12.0–15.0)
MCH: 22.1 pg — ABNORMAL LOW (ref 26.0–34.0)
MCHC: 31 g/dL (ref 30.0–36.0)
MCV: 71.3 fL — ABNORMAL LOW (ref 80.0–100.0)
Platelets: 267 10*3/uL (ref 150–400)
RBC: 5.34 MIL/uL — ABNORMAL HIGH (ref 3.87–5.11)
RDW: 23.3 % — ABNORMAL HIGH (ref 11.5–15.5)
WBC: 6.8 10*3/uL (ref 4.0–10.5)
nRBC: 0.3 % — ABNORMAL HIGH (ref 0.0–0.2)

## 2022-10-01 MED ORDER — ESCITALOPRAM OXALATE 10 MG PO TABS
5.0000 mg | ORAL_TABLET | Freq: Every day | ORAL | Status: DC
  Administered 2022-10-01 – 2022-10-25 (×25): 5 mg via ORAL
  Filled 2022-10-01 (×25): qty 1

## 2022-10-01 MED ORDER — SPIRONOLACTONE 25 MG PO TABS
25.0000 mg | ORAL_TABLET | Freq: Every day | ORAL | Status: DC
  Administered 2022-10-02 – 2022-10-31 (×30): 25 mg via ORAL
  Filled 2022-10-01 (×31): qty 1

## 2022-10-01 MED ORDER — DIPHENHYDRAMINE HCL 12.5 MG/5ML PO ELIX: 12.5000 mg | ORAL_SOLUTION | Freq: Four times a day (QID) | ORAL | Status: DC | PRN

## 2022-10-01 MED ORDER — INSULIN ASPART 100 UNIT/ML IJ SOLN
0.0000 [IU] | Freq: Three times a day (TID) | INTRAMUSCULAR | Status: DC
  Administered 2022-10-01: 2 [IU] via SUBCUTANEOUS
  Administered 2022-10-02 (×2): 3 [IU] via SUBCUTANEOUS
  Administered 2022-10-02: 2 [IU] via SUBCUTANEOUS
  Administered 2022-10-03: 5 [IU] via SUBCUTANEOUS
  Administered 2022-10-03: 2 [IU] via SUBCUTANEOUS
  Administered 2022-10-04 (×2): 3 [IU] via SUBCUTANEOUS
  Administered 2022-10-04: 2 [IU] via SUBCUTANEOUS
  Administered 2022-10-05: 3 [IU] via SUBCUTANEOUS
  Administered 2022-10-05: 2 [IU] via SUBCUTANEOUS
  Administered 2022-10-05: 3 [IU] via SUBCUTANEOUS
  Administered 2022-10-06: 2 [IU] via SUBCUTANEOUS
  Administered 2022-10-06: 3 [IU] via SUBCUTANEOUS
  Administered 2022-10-06: 2 [IU] via SUBCUTANEOUS
  Administered 2022-10-07: 3 [IU] via SUBCUTANEOUS
  Administered 2022-10-07: 2 [IU] via SUBCUTANEOUS
  Administered 2022-10-08 (×2): 3 [IU] via SUBCUTANEOUS
  Administered 2022-10-08: 2 [IU] via SUBCUTANEOUS
  Administered 2022-10-09: 3 [IU] via SUBCUTANEOUS
  Administered 2022-10-09 – 2022-10-10 (×4): 2 [IU] via SUBCUTANEOUS
  Administered 2022-10-11: 3 [IU] via SUBCUTANEOUS
  Administered 2022-10-11 (×2): 2 [IU] via SUBCUTANEOUS
  Administered 2022-10-12: 3 [IU] via SUBCUTANEOUS
  Administered 2022-10-12: 8 [IU] via SUBCUTANEOUS

## 2022-10-01 MED ORDER — HYDROXYZINE HCL 10 MG PO TABS
10.0000 mg | ORAL_TABLET | Freq: Three times a day (TID) | ORAL | Status: DC | PRN
  Administered 2022-10-02 – 2022-10-31 (×5): 10 mg via ORAL
  Filled 2022-10-01 (×8): qty 1

## 2022-10-01 MED ORDER — BISACODYL 10 MG RE SUPP
10.0000 mg | Freq: Every day | RECTAL | Status: DC | PRN
  Administered 2022-10-18 – 2022-10-31 (×2): 10 mg via RECTAL
  Filled 2022-10-01 (×2): qty 1

## 2022-10-01 MED ORDER — ATORVASTATIN CALCIUM 80 MG PO TABS
80.0000 mg | ORAL_TABLET | Freq: Every day | ORAL | Status: DC
  Administered 2022-10-01 – 2022-10-31 (×31): 80 mg via ORAL
  Filled 2022-10-01 (×31): qty 1

## 2022-10-01 MED ORDER — APIXABAN 5 MG PO TABS
5.0000 mg | ORAL_TABLET | Freq: Two times a day (BID) | ORAL | Status: DC
  Administered 2022-10-01 – 2022-11-01 (×62): 5 mg via ORAL
  Filled 2022-10-01 (×63): qty 1

## 2022-10-01 MED ORDER — METOPROLOL TARTRATE 25 MG PO TABS
25.0000 mg | ORAL_TABLET | Freq: Two times a day (BID) | ORAL | 1 refills | Status: DC
  Filled 2022-10-01: qty 60, 30d supply, fill #0

## 2022-10-01 MED ORDER — PROCHLORPERAZINE MALEATE 5 MG PO TABS
5.0000 mg | ORAL_TABLET | Freq: Four times a day (QID) | ORAL | Status: DC | PRN
  Administered 2022-10-02 – 2022-10-12 (×5): 10 mg via ORAL
  Filled 2022-10-01 (×5): qty 2

## 2022-10-01 MED ORDER — ORAL CARE MOUTH RINSE
15.0000 mL | OROMUCOSAL | Status: DC
  Administered 2022-10-01: 15 mL via OROMUCOSAL

## 2022-10-01 MED ORDER — APIXABAN 5 MG PO TABS
5.0000 mg | ORAL_TABLET | Freq: Two times a day (BID) | ORAL | 1 refills | Status: DC
  Filled 2022-10-01: qty 60, 30d supply, fill #0

## 2022-10-01 MED ORDER — PANTOPRAZOLE SODIUM 40 MG PO TBEC
40.0000 mg | DELAYED_RELEASE_TABLET | Freq: Every day | ORAL | Status: DC
  Administered 2022-10-01 – 2022-10-31 (×31): 40 mg via ORAL
  Filled 2022-10-01 (×30): qty 1

## 2022-10-01 MED ORDER — ALUM & MAG HYDROXIDE-SIMETH 200-200-20 MG/5ML PO SUSP: 30.0000 mL | ORAL | Status: DC | PRN

## 2022-10-01 MED ORDER — TRAZODONE HCL 50 MG PO TABS: 25.0000 mg | ORAL_TABLET | Freq: Every evening | ORAL | Status: DC | PRN

## 2022-10-01 MED ORDER — INSULIN ASPART 100 UNIT/ML IJ SOLN
0.0000 [IU] | Freq: Every day | INTRAMUSCULAR | Status: DC
  Administered 2022-10-02: 2 [IU] via SUBCUTANEOUS
  Administered 2022-10-11: 3 [IU] via SUBCUTANEOUS

## 2022-10-01 MED ORDER — EMPAGLIFLOZIN 10 MG PO TABS
10.0000 mg | ORAL_TABLET | Freq: Every day | ORAL | Status: DC
  Administered 2022-10-02 – 2022-10-04 (×3): 10 mg via ORAL
  Filled 2022-10-01 (×3): qty 1

## 2022-10-01 MED ORDER — PANTOPRAZOLE SODIUM 40 MG IV SOLR: 40.0000 mg | Freq: Every day | INTRAVENOUS | Status: DC

## 2022-10-01 MED ORDER — ORAL CARE MOUTH RINSE: 15.0000 mL | OROMUCOSAL | Status: DC | PRN

## 2022-10-01 MED ORDER — ADULT MULTIVITAMIN W/MINERALS CH
1.0000 | ORAL_TABLET | Freq: Every day | ORAL | 1 refills | Status: DC
  Filled 2022-10-01: qty 30, 30d supply, fill #0

## 2022-10-01 MED ORDER — ORAL CARE MOUTH RINSE
15.0000 mL | Freq: Three times a day (TID) | OROMUCOSAL | Status: DC
  Administered 2022-10-01 – 2022-11-01 (×97): 15 mL via OROMUCOSAL

## 2022-10-01 MED ORDER — POLYETHYLENE GLYCOL 3350 17 G PO PACK
17.0000 g | PACK | Freq: Every day | ORAL | Status: DC | PRN
  Administered 2022-10-07 – 2022-10-10 (×2): 17 g via ORAL
  Filled 2022-10-01 (×2): qty 1

## 2022-10-01 MED ORDER — EMPAGLIFLOZIN 10 MG PO TABS
10.0000 mg | ORAL_TABLET | Freq: Every day | ORAL | 1 refills | Status: DC
  Filled 2022-10-01: qty 30, 30d supply, fill #0

## 2022-10-01 MED ORDER — TORSEMIDE 20 MG PO TABS
40.0000 mg | ORAL_TABLET | Freq: Every day | ORAL | 1 refills | Status: DC
  Filled 2022-10-01: qty 30, 30d supply, fill #0
  Filled 2022-10-21: qty 60, 30d supply, fill #0

## 2022-10-01 MED ORDER — FLEET ENEMA 7-19 GM/118ML RE ENEM
1.0000 | ENEMA | Freq: Once | RECTAL | Status: AC | PRN
  Administered 2022-10-01: 1 via RECTAL
  Filled 2022-10-01: qty 1

## 2022-10-01 MED ORDER — ADULT MULTIVITAMIN W/MINERALS CH
1.0000 | ORAL_TABLET | Freq: Every day | ORAL | Status: DC
  Administered 2022-10-02 – 2022-11-01 (×31): 1 via ORAL
  Filled 2022-10-01 (×31): qty 1

## 2022-10-01 MED ORDER — TORSEMIDE 20 MG PO TABS
40.0000 mg | ORAL_TABLET | Freq: Every day | ORAL | Status: DC
  Administered 2022-10-02 – 2022-10-04 (×3): 40 mg via ORAL
  Filled 2022-10-01 (×3): qty 2

## 2022-10-01 MED ORDER — LOSARTAN POTASSIUM 50 MG PO TABS
50.0000 mg | ORAL_TABLET | Freq: Every day | ORAL | Status: DC
  Administered 2022-10-02 – 2022-11-01 (×31): 50 mg via ORAL
  Filled 2022-10-01 (×32): qty 1

## 2022-10-01 MED ORDER — POLYETHYLENE GLYCOL 3350 17 G PO PACK
17.0000 g | PACK | Freq: Every day | ORAL | Status: DC
  Administered 2022-10-01 – 2022-10-08 (×8): 17 g via ORAL
  Filled 2022-10-01 (×8): qty 1

## 2022-10-01 MED ORDER — METOPROLOL TARTRATE 25 MG PO TABS
25.0000 mg | ORAL_TABLET | Freq: Two times a day (BID) | ORAL | Status: DC
  Administered 2022-10-01 – 2022-11-01 (×60): 25 mg via ORAL
  Filled 2022-10-01 (×63): qty 1

## 2022-10-01 MED ORDER — PROCHLORPERAZINE 25 MG RE SUPP: 12.5000 mg | Freq: Four times a day (QID) | RECTAL | Status: DC | PRN

## 2022-10-01 MED ORDER — MORPHINE SULFATE 15 MG PO TABS
15.0000 mg | ORAL_TABLET | Freq: Four times a day (QID) | ORAL | Status: DC | PRN
  Administered 2022-10-01 – 2022-10-20 (×18): 15 mg via ORAL
  Filled 2022-10-01 (×18): qty 1

## 2022-10-01 MED ORDER — ACETAMINOPHEN 325 MG PO TABS
325.0000 mg | ORAL_TABLET | ORAL | Status: DC | PRN
  Administered 2022-10-02 – 2022-10-29 (×9): 650 mg via ORAL
  Filled 2022-10-01 (×12): qty 2

## 2022-10-01 MED ORDER — GUAIFENESIN-DM 100-10 MG/5ML PO SYRP: 5.0000 mL | ORAL_SOLUTION | Freq: Four times a day (QID) | ORAL | Status: DC | PRN

## 2022-10-01 MED ORDER — PROCHLORPERAZINE EDISYLATE 10 MG/2ML IJ SOLN
5.0000 mg | Freq: Four times a day (QID) | INTRAMUSCULAR | Status: DC | PRN
  Administered 2022-10-28: 10 mg via INTRAMUSCULAR
  Filled 2022-10-01: qty 2

## 2022-10-01 NOTE — Progress Notes (Signed)
Inpatient Rehabilitation Admission Medication Review by a Pharmacist  A complete drug regimen review was completed for this patient to identify any potential clinically significant medication issues.  High Risk Drug Classes Is patient taking? Indication by Medication  Antipsychotic Yes Compazine-N/V  Anticoagulant Yes Apixaban (afib/LV thrombus)  Antibiotic No   Opioid No   Antiplatelet No   Hypoglycemics/insulin Yes SSI-T2DM  Vasoactive Medication Yes Losartan, lopressor,   Chemotherapy No   Other Yes Torsemide, spiro-fluid Miralax, bisacodyl, fleet enema-constipation Protonix-GERD Tylenol-pain Trazodone-sleep aid Benadryl-itching Robitussin-cough Atarax-anxiety Lexapro-mood Jardiance-heart failure/ T2DM Atorv-HLD     Type of Medication Issue Identified Description of Issue Recommendation(s)  Drug Interaction(s) (clinically significant)     Duplicate Therapy     Allergy     No Medication Administration End Date     Incorrect Dose     Additional Drug Therapy Needed     Significant med changes from prior encounter (inform family/care partners about these prior to discharge). Ferrous sulfate, reglan, toprol, zoloft Restart PTA meds when and if necessary during CIR admission or at time of discharge, if warranted   Other       Clinically significant medication issues were identified that warrant physician communication and completion of prescribed/recommended actions by midnight of the next day:  Yes  Name of provider notified for urgent issues identified:   Provider Method of Notification:   Pharmacist comments: switched from warfarin to eliquis last phase of care  Time spent performing this drug regimen review (minutes):  30 minutes  Sandford Craze, PharmD. [facility] PGY-1  10/01/2022 2:25 PM

## 2022-10-01 NOTE — H&P (Signed)
Physical Medicine and Rehabilitation Admission H&P        Chief Complaint  Patient presents with   Acute functional decline due to stroke.       HPI:  [name] is a 47 year old female withhistory fo T2DM, NICM w/chronic systolic/diastolic HF, PAF w/LV thrombus who was presented to ED on 09/22/22 with reports of SOB and CP  X 2 weeks with BLE edema.CTA chest negative for PE and showed anasarca. She was found to have elevated troponins with acute on chronic combined CHF and started on IV Heparin as well as IV diuresis. Cardiology was consulted and felt that patient with demand ischemia due to CHF as EKG without ST changes.  She continued to have issues with SOB and medication being adjusted by cards with recommendations to transition patient to Eliquis due to noncompliance hx. On afternoon of 03/02, she developed acute onset of right gaze deviation with left hemiplegia. CTA  head/neck done revealing loss of gray white differentiation in insula likely reflecting early infarct, proximal right M2 occlusion with minimal flow throughout the MCA division. She underwent cerebral angio with endovascular revascularization of occluded inferior division of M2 segment of R-MCA with aspiration and without significant change w/TICI 2B revascularization.  She did have briefe cardiac arrest during IR procedure requiring 2 minutes ACLS prior to ROSC.    2D echo done revealing large LV apical thrombus with severe reduction in LVEF with global hypokinesis, EF 15% and moderate TVR. MRI brain done revealing large area of infarct in R-MCA distribution with small area in right ACA distribution and acute punctate infarct in left cerebellar hemisphere. Dr. Erlinda Hong felt that stroke was "likely embolic from LV thrombus with Low EF and PAF even while on heparin". She was kept intubated till mental status improved and self extubated on 03/05. Respiratory status stable, lethargy was slowly resolving, cortak removed and diet advanced  to D3, thins. She was transitioned to Apixaban 03/08   Therapy has been working with patient who continues to be limited by left hemiplegia with left inattention and right gaze preference, decreased balance, lacks awareness of deficits, impulsivity with delay in processing and ability to follow one step commands as well as perseveration on going home. CIR recommended due to functional decline.            Review of Systems  Constitutional:  Negative for chills and fever.  HENT:  Negative for hearing loss and tinnitus.   Eyes:  Negative for blurred vision.  Respiratory:  Negative for cough.   Cardiovascular:  Negative for chest pain and claudication.  Gastrointestinal:  Positive for nausea.  Genitourinary:  Negative for dysuria.  Musculoskeletal:  Positive for back pain and neck pain.  Skin:  Negative for rash.  Neurological:  Positive for sensory change, weakness and headaches.  Psychiatric/Behavioral:  The patient is nervous/anxious. The patient does not have insomnia.           Past Medical History:  Diagnosis Date   Abnormal Pap smear     Acute combined systolic and diastolic heart failure ([place]) 02/19/2019    Wt Readings from Last 3 Encounters:  09/18/21  84.7 kg  09/02/21  84.4 kg  08/31/21  84.2 kg         Acute heart failure ([place]) 01/29/2022   Acute on chronic combined systolic and diastolic CHF (congestive heart failure) ([place]) 02/19/2019   Acute on chronic congestive heart failure (HCC)     Acute on chronic congestive heart  failure (HCC)     Acute on chronic diastolic CHF (congestive heart failure) ([place]) 10/19/2021   Acute on chronic systolic heart failure ([place]) 02/22/2022   Anemia     Asthma     Atrial fibrillation (HCC)      history of paroxysmal atrial fibrillation   CHF (congestive heart failure) ([place]) 11/02/2021   CHF exacerbation ([place]) 10/17/2021   Diabetes mellitus     DKA, type 2 ([place]) 02/06/2019   Elevated troponin 09/30/2019   Heart failure with reduced  ejection fraction ([place])     History of Left ventricular thrombus 09/22/2021   Hydrosalpinx 08/21/2009    Dx on Pelvic US in Jan 2011.  Consider chronic etiology given continued fertility issues   Hypertension     Hypertensive emergency 09/30/2019   Hypertensive urgency 06/02/2022   Major depressive disorder 02/08/2012    Reports that 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     Migraines     Morbid obesity ([place]) 02/06/2019   Nausea & vomiting 06/26/2022   Nonischemic cardiomyopathy ([place]) 09/22/2021   NSTEMI (non-ST elevated myocardial infarction) ([place]) 02/23/2022   Obstructive sleep apnea 06/30/2015   Polycystic ovarian disease             Past Surgical History:  Procedure Laterality Date   BIOPSY   11/05/2021    Procedure: BIOPSY;  Surgeon: Daryel November, MD;  Location: [facility] ENDOSCOPY;  Service: Gastroenterology;;   CERVIX LESION DESTRUCTION       CESAREAN SECTION       ESOPHAGOGASTRODUODENOSCOPY (EGD) WITH PROPOFOL N/A 11/05/2021    Procedure: ESOPHAGOGASTRODUODENOSCOPY (EGD) WITH PROPOFOL;  Surgeon: Daryel November, MD;  Location: [facility] ENDOSCOPY;  Service: Gastroenterology;  Laterality: N/A;   IR CT HEAD LTD   09/24/2022   IR PERCUTANEOUS ART THROMBECTOMY/INFUSION INTRACRANIAL INC DIAG ANGIO   09/24/2022   RADIOLOGY WITH ANESTHESIA N/A 09/24/2022    Procedure: IR WITH ANESTHESIA;  Surgeon: Radiologist, Medication, MD;  Location: [facility];  Service: Radiology;  Laterality: N/A;   RIGHT/LEFT HEART CATH AND CORONARY ANGIOGRAPHY N/A 03/29/2019    Procedure: RIGHT/LEFT HEART CATH AND CORONARY ANGIOGRAPHY;  Surgeon: Larey Dresser, MD;  Location: [facility] CV LAB;  Service: Cardiovascular;  Laterality: N/A;   RIGHT/LEFT HEART CATH AND CORONARY ANGIOGRAPHY N/A 09/20/2021    Procedure: RIGHT/LEFT HEART CATH AND CORONARY ANGIOGRAPHY;  Surgeon: Larey Dresser, MD;  Location: [facility];  Service: Cardiovascular;  Laterality:  N/A;           Family History  Problem Relation Age of Onset   Diabetes Mother     Hypertension Mother     Hypertension Father     Diabetes Father        Social History: Single. Works as a Teacher, adult education. She reports that she has never smoked. She has never used smokeless tobacco. She reports that she does not drink alcohol and does not use drugs.          Allergies  Allergen Reactions   Fish Allergy Swelling   Shellfish Allergy Anaphylaxis, Swelling and Other (See Comments)      Airway involvement including EMS - Has Epi-Pen   Metformin And Related Diarrhea   Trulicity [Dulaglutide] Nausea And Vomiting and Other (See Comments)      Multiple episodes of vomiting over multiple days   Nsaids Other (See Comments)      Per PCP  interferes with daily meds (heart failure)   Advil [Ibuprofen] Other (See Comments)      Per PCP interferes with daily meds (heart failure)            Medications Prior to Admission  Medication Sig Dispense Refill   acetaminophen (TYLENOL) 500 MG tablet Take 500-1,000 mg by mouth every 6 (six) hours as needed for mild pain or headache.       atorvastatin (LIPITOR) 80 MG tablet Take 1 tablet (80 mg total) by mouth daily. 90 tablet 3   empagliflozin (JARDIANCE) 10 MG TABS tablet Take 1 tablet (10 mg total) by mouth daily. 30 tablet 0   insulin aspart (NOVOLOG FLEXPEN) 100 UNIT/ML FlexPen Inject 12 Units into the skin daily before supper. 15 mL 0   insulin glargine (LANTUS) 100 UNIT/ML Solostar Pen Inject 15 Units into the skin daily. 15 mL 0   losartan (COZAAR) 50 MG tablet Take 1 tablet (50 mg total) by mouth daily. 30 tablet 0   warfarin (COUMADIN) 7.5 MG tablet Take 1-2 tablets (7.5-15 mg total) by mouth daily as directed by [facility] (Patient taking differently: Take 7.5-11.25 mg by mouth as directed. Take 1 tablet (7.5 mg) tablet by mouth every Monday & Take 1.5 tablets (11.25 mg) all other days) 40 tablet 0   Accu-Chek Softclix  Lancets lancets Use as instructed 100 each 12   Accu-Chek Softclix Lancets lancets Use up 4 (four) times daily as directed 100 each 0   blood glucose meter kit and supplies Use up to 4 (four) times daily as directed 1 each 0   ferrous sulfate 325 (65 FE) MG tablet Take 1 tablet (325 mg total) by mouth daily with breakfast. (Patient not taking: Reported on 09/23/2022) 30 tablet 0   glucose blood (ONETOUCH VERIO) test strip Use up to 4 (four) times daily as directed 100 each 0   hydrOXYzine (ATARAX) 10 MG tablet Take 1 tablet (10 mg total) by mouth 3 (three) times daily as needed. (Patient taking differently: Take 10 mg by mouth 3 (three) times daily as needed for anxiety.) 90 tablet 3   Insulin Pen Needle (UNIFINE PENTIPS) 31G X 5 MM MISC USE AS DIRECTED 2 TIMES DAILY 100 each 11   metoCLOPramide (REGLAN) 5 MG tablet Take 1 tablet (5 mg total) by mouth 4 (four) times daily -  before meals and at bedtime. (Patient not taking: Reported on 09/23/2022) 60 tablet 0   metoprolol succinate (TOPROL-XL) 25 MG 24 hr tablet Take 1 tablet (25 mg total) by mouth daily. 30 tablet 0   polyethylene glycol (MIRALAX / GLYCOLAX) 17 g packet Take 17 g by mouth daily as needed for mild constipation. 14 each 0   prochlorperazine (COMPAZINE) 5 MG tablet Take 1 tablet (5 mg total) by mouth every 6 (six) hours as needed for refractory nausea / vomiting. 30 tablet 0   senna-docusate (SENOKOT-S) 8.6-50 MG tablet Take 1 tablet by mouth at bedtime as needed for moderate constipation. 30 tablet 0   sertraline (ZOLOFT) 50 MG tablet Take 50 mg by mouth daily.       spironolactone (ALDACTONE) 25 MG tablet Take 25 mg by mouth daily. Take 1 Tablet Daily       Torsemide 40 MG TABS Take 40 mg by mouth 2 (two) times daily. 180 tablet 3   traZODone (DESYREL) 50 MG tablet Take 1 tablet (50 mg total) by mouth at bedtime. 30 tablet 0  Home: Home Living Family/patient expects to be discharged to:: Private residence Living  Arrangements: Alone Available Help at Discharge: Family, Friend(s), Available 24 hours/day (daughter, dtr's partner, second dtr, and family) Type of Home: Apartment Home Access: Stairs to enter Technical brewer of Steps: 1 Home Layout: Two level, 1/2 bath on main level, Bed/bath upstairs Alternate Level Stairs-Number of Steps: flight Alternate Level Stairs-Rails: Right Bathroom Shower/Tub: Chiropodist: Standard Bathroom Accessibility: Yes Home Equipment: Hand held shower head Additional Comments: in the process of getting a handicapped accessible apartment  Lives With: Alone   Functional History: Prior Function Prior Level of Function : Independent/Modified Independent, Driving, Working/employed Mobility Comments: no AD, works as a Research officer, 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 Her boyfriend has been driving her to/from work. ADLs Comments: mod I   Functional Status:  Mobility: Bed Mobility Overal bed mobility: Needs Assistance Bed Mobility: Rolling, Sidelying to Sit Rolling: +2 for physical assistance, Mod assist Sidelying to sit: Mod assist, +2 for physical assistance, HOB elevated General bed mobility comments: roll to left using RUE to pull on rail; assist to move legs over EOB and more assist to elevate trunk from left sidelying Transfers Overall transfer level: Needs assistance Equipment used: 2 person hand held assist Transfers: Sit to/from Stand, Bed to chair/wheelchair/BSC Sit to Stand: Mod assist, +2 physical assistance Bed to/from chair/wheelchair/BSC transfer type:: Stand pivot Stand pivot transfers: Mod assist, +2 physical assistance General transfer comment: left knee not buckling although hyperextended and closely guarded during standing and pivot; pt unable to advance either foot towards chair and pivoted on RLE; pt sat prematurely on edge of chair and required +2 assist to scoot hips back onto seat of chair Ambulation/Gait Ambulation/Gait  assistance: Min guard Gait Distance (Feet): 30 Feet Assistive device: None Gait Pattern/deviations: Step-through pattern, Decreased stride length General Gait Details: unable Gait velocity: decreased Gait velocity interpretation: <1.31 ft/sec, indicative of household ambulator   ADL: ADL Overall ADL's : Needs assistance/impaired Eating/Feeding: Independent Grooming: Wash/dry hands, Wash/dry face, Supervision/safety, Standing Upper Body Bathing: Supervision/ safety, Set up Lower Body Bathing: Supervison/ safety, Set up Upper Body Dressing : Set up Lower Body Dressing: Set up Toilet Transfer: Supervision/safety, Ambulation, Comfort height toilet Toileting- Clothing Manipulation and Hygiene: Supervision/safety Functional mobility during ADLs: Supervision/safety General ADL Comments: total (A) for all care at this time   Cognition: Cognition Overall Cognitive Status: Impaired/Different from baseline Arousal/Alertness: Awake/alert Orientation Level: Oriented X4 Year:  (1962) Month: March Day of Week: Incorrect Attention: Focused, Sustained Focused Attention: Appears intact Sustained Attention: Impaired Sustained Attention Impairment: Verbal basic Memory: Impaired Memory Impairment: Retrieval deficit (Immediate: 5/5 with repetition x2; delayed: 1/5; with cues: 1/4) Awareness: Impaired Awareness Impairment: Intellectual impairment Problem Solving: Impaired Problem Solving Impairment: Verbal basic Cognition Arousal/Alertness: Awake/alert Behavior During Therapy: Flat affect Overall Cognitive Status: Impaired/Different from baseline Area of Impairment: Orientation, Following commands, Safety/judgement, Awareness, Problem solving Orientation Level: Disoriented to, Time Following Commands: Follows one step commands with increased time Safety/Judgement: Decreased awareness of safety, Decreased awareness of deficits Awareness: Intellectual Problem Solving: Slow processing,  Decreased initiation, Difficulty sequencing, Requires verbal cues, Requires tactile cues General Comments: very slow to follow commands; stating repeatedly that she wants to go home with little awareness of her deficits Difficult to assess due to: Impaired communication, Level of arousal     Blood pressure (!) 131/106, pulse 82, temperature (!) 96.8 F (36 C), temperature source Axillary, resp. rate 19, height 4' 11.02" (1.499 m), weight 90.3 kg, last menstrual period 06/22/2022,  SpO2 96 %. Physical Exam Vitals and nursing note reviewed.  Constitutional:      General: She is not in acute distress.    Appearance: She is obese.     Comments: Kept eyes turned to the right but able to turn to midline and left with minimal cues.   HENT:     Head: Normocephalic.     Mouth/Throat:     Mouth: Mucous membranes are moist.     Comments: Tongue with white coating Eyes:     Conjunctiva/sclera: Conjunctivae normal.     Pupils: Pupils are equal, round, and reactive to light.  Cardiovascular:     Rate and Rhythm: Normal rate and regular rhythm.  Pulmonary:     Effort: Pulmonary effort is normal. No respiratory distress.     Breath sounds: Normal breath sounds. No wheezing.  Abdominal:     General: Bowel sounds are normal.     Palpations: Abdomen is soft.  Musculoskeletal:        General: No swelling or tenderness.  Skin:    General: Skin is warm and dry.  Neurological:     Mental Status: She is alert.     Comments: Pt appears lethargic but awakens and responds to questions. Speech dysarthric, low volume. Language appears to be intact. Right gaze preference but can follow to left to a lesser extent. No obvious field cut. Left central 7 and tongue deviation. LUE and LLE 0/5. Senses pain and LT in all 4's. No resting tone. DTR's 1+ throughout. Toes down left foot, no clonus.   Psychiatric:     Comments: Flat, depressed        Lab Results Last 48 Hours        Results for orders placed or  performed during the hospital encounter of 09/22/22 (from the past 48 hour(s))  Glucose, capillary     Status: Abnormal    Collection Time: 09/28/22  3:24 PM  Result Value Ref Range    Glucose-Capillary 180 (H) 70 - 99 mg/dL      Comment: Glucose reference range applies only to samples taken after fasting for at least 8 hours.  Glucose, capillary     Status: Abnormal    Collection Time: 09/28/22  7:31 PM  Result Value Ref Range    Glucose-Capillary 189 (H) 70 - 99 mg/dL      Comment: Glucose reference range applies only to samples taken after fasting for at least 8 hours.  Urinalysis, Complete w Microscopic -Urine, Clean Catch     Status: Abnormal    Collection Time: 09/28/22  7:51 PM  Result Value Ref Range    Color, Urine AMBER (A) YELLOW      Comment: BIOCHEMICALS MAY BE AFFECTED BY COLOR    APPearance HAZY (A) CLEAR    Specific Gravity, Urine 1.024 1.005 - 1.030    pH 6.0 5.0 - 8.0    Glucose, UA >=500 (A) NEGATIVE mg/dL    Hgb urine dipstick NEGATIVE NEGATIVE    Bilirubin Urine NEGATIVE NEGATIVE    Ketones, ur NEGATIVE NEGATIVE mg/dL    Protein, ur 100 (A) NEGATIVE mg/dL    Nitrite NEGATIVE NEGATIVE    Leukocytes,Ua MODERATE (A) NEGATIVE    RBC / HPF 0-5 0 - 5 RBC/hpf    WBC, UA 11-20 0 - 5 WBC/hpf    Bacteria, UA RARE (A) NONE SEEN    Squamous Epithelial / HPF 0-5 0 - 5 /HPF    Mucus PRESENT  Comment: Performed at [facility], [place] [address], [place], [place] [address]  Glucose, capillary     Status: Abnormal    Collection Time: 09/28/22 11:17 PM  Result Value Ref Range    Glucose-Capillary 165 (H) 70 - 99 mg/dL      Comment: Glucose reference range applies only to samples taken after fasting for at least 8 hours.  Glucose, capillary     Status: Abnormal    Collection Time: 09/29/22  3:49 AM  Result Value Ref Range    Glucose-Capillary 183 (H) 70 - 99 mg/dL      Comment: Glucose reference range applies only to samples taken after fasting for at least 8  hours.  CBC     Status: Abnormal    Collection Time: 09/29/22  4:53 AM  Result Value Ref Range    WBC 10.5 4.0 - 10.5 K/uL    RBC 5.17 (H) 3.87 - 5.11 MIL/uL    Hemoglobin 11.6 (L) 12.0 - 15.0 g/dL    HCT 37.6 36.0 - 46.0 %    MCV 72.7 (L) 80.0 - 100.0 fL    MCH 22.4 (L) 26.0 - 34.0 pg    MCHC 30.9 30.0 - 36.0 g/dL    RDW 23.7 (H) 11.5 - 15.5 %    Platelets 260 150 - 400 K/uL      Comment: REPEATED TO VERIFY    nRBC 0.2 0.0 - 0.2 %      Comment: Performed at [facility], [place] [address], [place], [place] [address]  Heparin level (unfractionated)     Status: None    Collection Time: 09/29/22  4:53 AM  Result Value Ref Range    Heparin Unfractionated 0.34 0.30 - 0.70 IU/mL      Comment: (NOTE) The clinical reportable range upper limit is being lowered to >1.10 to align with the FDA approved guidance for the current laboratory assay.   If heparin results are below expected values, and patient dosage has  been confirmed, suggest follow up testing of antithrombin III levels. Performed at [facility], [place] [address] [place], [place] [id number]    Basic metabolic panel     Status: Abnormal    Collection Time: 09/29/22  4:53 AM  Result Value Ref Range    Sodium 136 135 - 145 mmol/L    Potassium 3.9 3.5 - 5.1 mmol/L    Chloride 98 98 - 111 mmol/L    CO2 27 22 - 32 mmol/L    Glucose, Bld 172 (H) 70 - 99 mg/dL      Comment: Glucose reference range applies only to samples taken after fasting for at least 8 hours.    BUN 30 (H) 6 - 20 mg/dL    Creatinine, Ser 0.92 0.44 - 1.00 mg/dL    Calcium 8.8 (L) 8.9 - 10.3 mg/dL    GFR, Estimated >60 >60 mL/min      Comment: (NOTE) Calculated using the CKD-EPI Creatinine Equation (2021)      Anion gap 11 5 - 15      Comment: Performed at [facility] [address] [place], [place] [address]  Glucose, capillary     Status: Abnormal    Collection Time: 09/29/22  7:33 AM  Result Value Ref Range    Glucose-Capillary 178  (H) 70 - 99 mg/dL      Comment: Glucose reference range applies only to samples taken after fasting for at least 8 hours.  Glucose, capillary     Status: Abnormal    Collection Time: 09/29/22 11:26 AM  Result Value Ref Range    Glucose-Capillary 210 (H) 70 - 99 mg/dL      Comment: Glucose reference range applies only to samples taken after fasting for at least 8 hours.  Glucose, capillary     Status: Abnormal    Collection Time: 09/29/22  3:39 PM  Result Value Ref Range    Glucose-Capillary 198 (H) 70 - 99 mg/dL      Comment: Glucose reference range applies only to samples taken after fasting for at least 8 hours.  Glucose, capillary     Status: Abnormal    Collection Time: 09/29/22  7:31 PM  Result Value Ref Range    Glucose-Capillary 185 (H) 70 - 99 mg/dL      Comment: Glucose reference range applies only to samples taken after fasting for at least 8 hours.  Glucose, capillary     Status: Abnormal    Collection Time: 09/29/22 11:26 PM  Result Value Ref Range    Glucose-Capillary 123 (H) 70 - 99 mg/dL      Comment: Glucose reference range applies only to samples taken after fasting for at least 8 hours.  Glucose, capillary     Status: None    Collection Time: 09/30/22  3:21 AM  Result Value Ref Range    Glucose-Capillary 78 70 - 99 mg/dL      Comment: Glucose reference range applies only to samples taken after fasting for at least 8 hours.  CBC     Status: Abnormal    Collection Time: 09/30/22  4:44 AM  Result Value Ref Range    WBC 7.9 4.0 - 10.5 K/uL    RBC 5.14 (H) 3.87 - 5.11 MIL/uL    Hemoglobin 11.8 (L) 12.0 - 15.0 g/dL    HCT 36.0 36.0 - 46.0 %    MCV 70.0 (L) 80.0 - 100.0 fL    MCH 23.0 (L) 26.0 - 34.0 pg    MCHC 32.8 30.0 - 36.0 g/dL    RDW 23.4 (H) 11.5 - 15.5 %    Platelets 248 150 - 400 K/uL      Comment: REPEATED TO VERIFY    nRBC 0.3 (H) 0.0 - 0.2 %      Comment: Performed at [facility], [place] [address], [place], [place] [address]  Heparin level  (unfractionated)     Status: None    Collection Time: 09/30/22  4:44 AM  Result Value Ref Range    Heparin Unfractionated 0.40 0.30 - 0.70 IU/mL      Comment: (NOTE) The clinical reportable range upper limit is being lowered to >1.10 to align with the FDA approved guidance for the current laboratory assay.   If heparin results are below expected values, and patient dosage has  been confirmed, suggest follow up testing of antithrombin III levels. Performed at [facility], [place] [address] [place], [place] [id number]    Basic metabolic panel     Status: Abnormal    Collection Time: 09/30/22  4:44 AM  Result Value Ref Range    Sodium 135 135 - 145 mmol/L    Potassium 3.9 3.5 - 5.1 mmol/L    Chloride 97 (L) 98 - 111 mmol/L    CO2 27 22 - 32 mmol/L    Glucose, Bld 67 (L) 70 - 99 mg/dL      Comment: Glucose reference range applies only to  samples taken after fasting for at least 8 hours.    BUN 26 (H) 6 - 20 mg/dL    Creatinine, Ser 1.04 (H) 0.44 - 1.00 mg/dL    Calcium 8.8 (L) 8.9 - 10.3 mg/dL    GFR, Estimated >60 >60 mL/min      Comment: (NOTE) Calculated using the CKD-EPI Creatinine Equation (2021)      Anion gap 11 5 - 15      Comment: Performed at [facility] [address] [place], [place] [address]  Glucose, capillary     Status: None    Collection Time: 09/30/22  8:05 AM  Result Value Ref Range    Glucose-Capillary 95 70 - 99 mg/dL      Comment: Glucose reference range applies only to samples taken after fasting for at least 8 hours.  Glucose, capillary     Status: Abnormal    Collection Time: 09/30/22 12:06 PM  Result Value Ref Range    Glucose-Capillary 151 (H) 70 - 99 mg/dL      Comment: Glucose reference range applies only to samples taken after fasting for at least 8 hours.      Imaging Results (Last 48 hours)  No results found.         Blood pressure (!) 131/106, pulse 82, temperature (!) 96.8 F (36 C), temperature source Axillary, resp. rate  19, height 4' 11.02" (1.499 m), weight 90.3 kg, last menstrual period 06/22/2022, SpO2 96 %.   Medical Problem List and Plan: 1. 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  Antithrombotics: -DVT/anticoagulation:  Pharmaceutical: Eliquis             -antiplatelet therapy: N/A 3.Back pain/Pain Management:  Tylenol prn. Oxycodone making her nausea worse and ineffective--?? 4. Mood/Behavior/Sleep: LCSW to follow for evaluation and support.              -antipsychotic agents: N/A             -pt reports chronic anxiety at baseline. Expresses that she's depressed as well currently.              -took atarax at home tid prn. Will resume.             -begin lexapro '5mg'$  qhs             -neuropsych eval 5. Neuropsych/cognition: This patient is not capable of making decisions on her own behalf. 6. Skin/Wound Care:  Routine pressure relief  measures.  7. Fluids/Electrolytes/Nutrition: Monitor I/O. Check CMET on 03/11.  8. Acute on chronic combined CHF: HH diet.  -Monitor weights daily and for signs of overload.             --continue Demadex, Cozaar, Spironolactone, Jardiance and Lopressor 9. T2DM: Hgb A1C-10.8  (improving >15 @ 11/23). Was on Lantus 15u w/ novolog 12 units with supper             --monitor BS ac/hs and use SSI for elevated BS             --on SSI  10. Acute chronic renal failure: Encourage fluid intake. Recheck CMET on 03/11 11.  Severe obesity: Educate patient on diet, compliance and exercise to help promote health and mobility.        Bary Leriche, PA-C 09/30/2022  I have personally performed a face to face diagnostic evaluation of this patient and formulated the key components of the plan.  Additionally, I have personally reviewed laboratory data, imaging studies, as well as relevant notes and concur with  the physician assistant's documentation above.  The patient's status has not changed from the original H&P.  Any changes in documentation from the acute care chart have been noted above.  Meredith Staggers, MD, Mell[facility]rown

## 2022-10-01 NOTE — Progress Notes (Signed)
DAILY PROGRESS NOTE   Patient Name: [name] Date of Encounter: 10/01/2022 Cardiologist: Janina Mayo, MD  Chief Complaint   Improved  Patient Profile   [name] is a 47 y.o. 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 Gwendlyn Deutscher.   Subjective   Diuresed about 1.7L negative overnight - now 5.6L negative. No BMET today. Known LV thrombus with recent stroke - subtherapeutic INR on warfarin, now on Eliquis.   Objective   Vitals:   09/30/22 2347 10/01/22 0357 10/01/22 0500 10/01/22 0838  BP: (!) 141/107 104/76  (!) 126/103  Pulse: 78 73  74  Resp: '18 18  18  '$ Temp: (!) 97.3 F (36.3 C) (!) 97 F (36.1 C)  97.8 F (36.6 C)  TempSrc: Oral Oral  Oral  SpO2: 100% 100%    Weight:   89.9 kg   Height:        Intake/Output Summary (Last 24 hours) at 10/01/2022 1051 Last data filed at 10/01/2022 0620 Gross per 24 hour  Intake 366.76 ml  Output 2600 ml  Net -2233.24 ml   Filed Weights   09/27/22 0500 09/29/22 0416 10/01/22 0500  Weight: 89.9 kg 90.3 kg 89.9 kg    Physical Exam   General appearance: somnolent, but arousable Lungs: bilateral exp wheezes Heart: regular rate and rhythm Extremities: extremities normal, atraumatic, no cyanosis or edema Neurologic: Mental status: somnolent, able to converse, some dysarthria  Inpatient Medications    Scheduled Meds:  apixaban  5 mg Oral BID   atorvastatin  80 mg Oral QHS   Chlorhexidine Gluconate Cloth  6 each Topical Q0600   docusate  100 mg Oral BID   empagliflozin  10 mg Oral Daily   insulin aspart  0-15 Units Subcutaneous TID WC   insulin aspart  0-5 Units Subcutaneous QHS   losartan  50 mg Oral Daily   metoprolol tartrate  25 mg Oral BID   multivitamin  with minerals  1 tablet Oral Daily   mouth rinse  15 mL Mouth Rinse Q4H   pantoprazole (PROTONIX) IV  40 mg Intravenous QHS   polyethylene glycol  17 g Oral Daily   spironolactone  25 mg Oral Daily   torsemide  40 mg Oral Daily    Continuous Infusions:   PRN Meds: acetaminophen **OR** acetaminophen (TYLENOL) oral liquid 160 mg/5 mL **OR** acetaminophen, morphine, ondansetron (ZOFRAN) IV, mouth rinse, phenol   Labs   Results for orders placed or performed during the hospital encounter of 09/22/22 (from the past 48 hour(s))  Glucose, capillary     Status: Abnormal   Collection Time: 09/29/22 11:26 AM  Result Value Ref Range   Glucose-Capillary 210 (H) 70 - 99 mg/dL    Comment: Glucose reference range applies only to samples taken after fasting for at least 8 hours.  Glucose, capillary     Status: Abnormal   Collection Time: 09/29/22  3:39 PM  Result Value Ref Range   Glucose-Capillary 198 (H) 70 - 99 mg/dL    Comment: Glucose reference range applies only to samples taken after fasting for at least 8 hours.  Glucose, capillary     Status: Abnormal   Collection Time: 09/29/22  7:31 PM  Result Value Ref  Range   Glucose-Capillary 185 (H) 70 - 99 mg/dL    Comment: Glucose reference range applies only to samples taken after fasting for at least 8 hours.  Glucose, capillary     Status: Abnormal   Collection Time: 09/29/22 11:26 PM  Result Value Ref Range   Glucose-Capillary 123 (H) 70 - 99 mg/dL    Comment: Glucose reference range applies only to samples taken after fasting for at least 8 hours.  Glucose, capillary     Status: None   Collection Time: 09/30/22  3:21 AM  Result Value Ref Range   Glucose-Capillary 78 70 - 99 mg/dL    Comment: Glucose reference range applies only to samples taken after fasting for at least 8 hours.  CBC     Status: Abnormal   Collection Time: 09/30/22  4:44 AM  Result Value Ref Range   WBC 7.9 4.0 - 10.5 K/uL   RBC 5.14 (H) 3.87 - 5.11 MIL/uL    Hemoglobin 11.8 (L) 12.0 - 15.0 g/dL   HCT 36.0 36.0 - 46.0 %   MCV 70.0 (L) 80.0 - 100.0 fL   MCH 23.0 (L) 26.0 - 34.0 pg   MCHC 32.8 30.0 - 36.0 g/dL   RDW 23.4 (H) 11.5 - 15.5 %   Platelets 248 150 - 400 K/uL    Comment: REPEATED TO VERIFY   nRBC 0.3 (H) 0.0 - 0.2 %    Comment: Performed at [facility], [place] [address], [place], [place] [address]  Heparin level (unfractionated)     Status: None   Collection Time: 09/30/22  4:44 AM  Result Value Ref Range   Heparin Unfractionated 0.40 0.30 - 0.70 IU/mL    Comment: (NOTE) The clinical reportable range upper limit is being lowered to >1.10 to align with the FDA approved guidance for the current laboratory assay.  If heparin results are below expected values, and patient dosage has  been confirmed, suggest follow up testing of antithrombin III levels. Performed at [facility], [place] [address] [place], [place] [id number]   Basic metabolic panel     Status: Abnormal   Collection Time: 09/30/22  4:44 AM  Result Value Ref Range   Sodium 135 135 - 145 mmol/L   Potassium 3.9 3.5 - 5.1 mmol/L   Chloride 97 (L) 98 - 111 mmol/L   CO2 27 22 - 32 mmol/L   Glucose, Bld 67 (L) 70 - 99 mg/dL    Comment: Glucose reference range applies only to samples taken after fasting for at least 8 hours.   BUN 26 (H) 6 - 20 mg/dL   Creatinine, Ser 1.04 (H) 0.44 - 1.00 mg/dL   Calcium 8.8 (L) 8.9 - 10.3 mg/dL   GFR, Estimated >60 >60 mL/min    Comment: (NOTE) Calculated using the CKD-EPI Creatinine Equation (2021)    Anion gap 11 5 - 15    Comment: Performed at [facility] [address], [place], [place] [address]  Glucose, capillary     Status: None   Collection Time: 09/30/22  8:05 AM  Result Value Ref Range   Glucose-Capillary 95 70 - 99 mg/dL    Comment: Glucose reference range applies only to samples taken after fasting for at least 8 hours.  Glucose, capillary     Status: Abnormal   Collection Time: 09/30/22 12:06 PM   Result Value Ref Range   Glucose-Capillary 151 (H) 70 - 99 mg/dL    Comment: Glucose reference  range applies only to samples taken after fasting for at least 8 hours.  Glucose, capillary     Status: Abnormal   Collection Time: 09/30/22  6:17 PM  Result Value Ref Range   Glucose-Capillary 168 (H) 70 - 99 mg/dL    Comment: Glucose reference range applies only to samples taken after fasting for at least 8 hours.  Glucose, capillary     Status: Abnormal   Collection Time: 09/30/22  9:30 PM  Result Value Ref Range   Glucose-Capillary 151 (H) 70 - 99 mg/dL    Comment: Glucose reference range applies only to samples taken after fasting for at least 8 hours.   Comment 1 Notify RN    Comment 2 Document in Chart   Glucose, capillary     Status: Abnormal   Collection Time: 10/01/22 12:27 AM  Result Value Ref Range   Glucose-Capillary 135 (H) 70 - 99 mg/dL    Comment: Glucose reference range applies only to samples taken after fasting for at least 8 hours.  Glucose, capillary     Status: Abnormal   Collection Time: 10/01/22  4:00 AM  Result Value Ref Range   Glucose-Capillary 110 (H) 70 - 99 mg/dL    Comment: Glucose reference range applies only to samples taken after fasting for at least 8 hours.   Comment 1 Notify RN    Comment 2 Document in Chart   CBC     Status: Abnormal   Collection Time: 10/01/22  4:06 AM  Result Value Ref Range   WBC 6.8 4.0 - 10.5 K/uL   RBC 5.34 (H) 3.87 - 5.11 MIL/uL   Hemoglobin 11.8 (L) 12.0 - 15.0 g/dL   HCT 38.1 36.0 - 46.0 %   MCV 71.3 (L) 80.0 - 100.0 fL   MCH 22.1 (L) 26.0 - 34.0 pg   MCHC 31.0 30.0 - 36.0 g/dL   RDW 23.3 (H) 11.5 - 15.5 %   Platelets 267 150 - 400 K/uL    Comment: REPEATED TO VERIFY   nRBC 0.3 (H) 0.0 - 0.2 %    Comment: Performed at [facility], [place] [address] [place], [place] [address]  Glucose, capillary     Status: Abnormal   Collection Time: 10/01/22  6:18 AM  Result Value Ref Range   Glucose-Capillary 121 (H) 70 -  99 mg/dL    Comment: Glucose reference range applies only to samples taken after fasting for at least 8 hours.   Comment 1 Notify RN    Comment 2 Document in Chart     ECG   N/A - Personally Reviewed  Telemetry   Sinus rhythm - Personally Reviewed  Radiology    No results found.  Cardiac Studies   N/A  Assessment   Principal Problem:   Acute on chronic combined systolic (congestive) and diastolic (congestive) heart failure (HCC) Active Problems:   Poorly controlled type 2 diabetes mellitus (HCC)   Hypertension associated with diabetes (HCC)   Chronic nausea   Paroxysmal atrial fibrillation (HCC)   Elevated troponin   Hypokalemia   LV (left ventricular) mural thrombus   Anasarca   Middle cerebral artery embolism, right   Acute respiratory failure with hypoxia (HCC)   Cerebrovascular accident (CVA) due to embolism of right middle cerebral artery ([place])   Plan   Good diuresis overnight- would recheck renal function tomorrow. Remains hypertensive - likely permissive. May need to increase meds tomorrow if ok with neuro.  Time Spent Directly with Patient:  I have spent a total of 25 minutes with the patient reviewing hospital notes, telemetry, EKGs, labs and examining the patient as well as establishing an assessment and plan that was discussed personally with the patient.  > 50% of time was spent in direct patient care.  Length of Stay:  LOS: 8 days   Pixie Casino, MD, [facility], [place] Director of the Advanced Lipid Disorders &  Cardiovascular Risk Reduction Clinic Diplomate of the American Board of Clinical Lipidology Attending Cardiologist  Direct Dial: [phone]  Fax: [phone]  Website:  [email or web address] Tyhesha Dutson 10/01/2022, 10:51 AM

## 2022-10-01 NOTE — Plan of Care (Signed)
Problem: Education: Goal: Knowledge of General Education information will improve Description: Including pain rating scale, medication(s)/side effects and non-pharmacologic comfort measures Outcome: Progressing   Problem: Health Behavior/Discharge Planning: Goal: Ability to manage health-related needs will improve Outcome: Progressing   Problem: Clinical Measurements: Goal: Ability to maintain clinical measurements within normal limits will improve Outcome: Progressing Goal: Will remain free from infection Outcome: Progressing Goal: Diagnostic test results will improve Outcome: Progressing Goal: Respiratory complications will improve Outcome: Progressing Goal: Cardiovascular complication will be avoided Outcome: Progressing   Problem: Activity: Goal: Risk for activity intolerance will decrease Outcome: Progressing   Problem: Nutrition: Goal: Adequate nutrition will be maintained Outcome: Progressing   Problem: Coping: Goal: Level of anxiety will decrease Outcome: Progressing   Problem: Elimination: Goal: Will not experience complications related to bowel motility Outcome: Progressing Goal: Will not experience complications related to urinary retention Outcome: Progressing   Problem: Pain Managment: Goal: General experience of comfort will improve Outcome: Progressing   Problem: Safety: Goal: Ability to remain free from injury will improve Outcome: Progressing   Problem: Skin Integrity: Goal: Risk for impaired skin integrity will decrease Outcome: Progressing   Problem: Education: Goal: Ability to describe self-care measures that may prevent or decrease complications (Diabetes Survival Skills Education) will improve Outcome: Progressing Goal: Individualized Educational Video(s) Outcome: Progressing   Problem: Coping: Goal: Ability to adjust to condition or change in health will improve Outcome: Progressing   Problem: Fluid Volume: Goal: Ability to  maintain a balanced intake and output will improve Outcome: Progressing   Problem: Health Behavior/Discharge Planning: Goal: Ability to identify and utilize available resources and services will improve Outcome: Progressing Goal: Ability to manage health-related needs will improve Outcome: Progressing   Problem: Metabolic: Goal: Ability to maintain appropriate glucose levels will improve Outcome: Progressing   Problem: Nutritional: Goal: Maintenance of adequate nutrition will improve Outcome: Progressing Goal: Progress toward achieving an optimal weight will improve Outcome: Progressing   Problem: Skin Integrity: Goal: Risk for impaired skin integrity will decrease Outcome: Progressing   Problem: Tissue Perfusion: Goal: Adequacy of tissue perfusion will improve Outcome: Progressing   Problem: Education: Goal: Knowledge of disease or condition will improve Outcome: Progressing Goal: Knowledge of secondary prevention will improve (MUST DOCUMENT ALL) Outcome: Progressing Goal: Knowledge of patient specific risk factors will improve Elta [place] N/A or DELETE if not current risk factor) Outcome: Progressing   Problem: Ischemic Stroke/TIA Tissue Perfusion: Goal: Complications of ischemic stroke/TIA will be minimized Outcome: Progressing   Problem: Coping: Goal: Will verbalize positive feelings about self Outcome: Progressing Goal: Will identify appropriate support needs Outcome: Progressing   Problem: Health Behavior/Discharge Planning: Goal: Ability to manage health-related needs will improve Outcome: Progressing Goal: Goals will be collaboratively established with patient/family Outcome: Progressing   Problem: Self-Care: Goal: Ability to participate in self-care as condition permits will improve Outcome: Progressing Goal: Verbalization of feelings and concerns over difficulty with self-care will improve Outcome: Progressing Goal: Ability to communicate needs accurately  will improve Outcome: Progressing   Problem: Nutrition: Goal: Risk of aspiration will decrease Outcome: Progressing Goal: Dietary intake will improve Outcome: Progressing   Problem: Education: Goal: Understanding of CV disease, CV risk reduction, and recovery process will improve Outcome: Progressing Goal: Individualized Educational Video(s) Outcome: Progressing   Problem: Activity: Goal: Ability to return to baseline activity level will improve Outcome: Progressing   Problem: Cardiovascular: Goal: Ability to achieve and maintain adequate cardiovascular perfusion will improve Outcome: Progressing Goal: Vascular access site(s) Level 0-1 will be maintained Outcome: Progressing  Problem: Health Behavior/Discharge Planning: Goal: Ability to safely manage health-related needs after discharge will improve Outcome: Progressing   

## 2022-10-01 NOTE — Progress Notes (Signed)
Arrived to CIR today. Admission/Assessments complete.   Yehuda Mao, LPN

## 2022-10-01 NOTE — Progress Notes (Signed)
Inpatient Rehab Admissions Coordinator:  There is a bed available for pt in CIR today. Dr. Erlinda Hong and Dr. Reeves Forth aware and in agreement. Pt, NSG, and TOC made aware. Attempted to call pt's father.   Gayland Curry, [place], [place] Admissions Coordinator [phone]

## 2022-10-01 NOTE — Progress Notes (Signed)
Orthopedic Tech Progress Note Patient Details:  [name] [birth year] [id number]  Order for L PRAFO and L resting WHO called into [facility].  Patient ID: [name], female   DOB: 1975-09-19, 47 y.o.   MRN: [id number]  [name] 10/01/2022, 6:50 PM

## 2022-10-02 DIAGNOSIS — G8929 Other chronic pain: Secondary | ICD-10-CM

## 2022-10-02 DIAGNOSIS — R739 Hyperglycemia, unspecified: Secondary | ICD-10-CM

## 2022-10-02 DIAGNOSIS — I10 Essential (primary) hypertension: Secondary | ICD-10-CM

## 2022-10-02 DIAGNOSIS — G4700 Insomnia, unspecified: Secondary | ICD-10-CM

## 2022-10-02 DIAGNOSIS — M549 Dorsalgia, unspecified: Secondary | ICD-10-CM

## 2022-10-02 LAB — GLUCOSE, CAPILLARY
Glucose-Capillary: 125 mg/dL — ABNORMAL HIGH (ref 70–99)
Glucose-Capillary: 163 mg/dL — ABNORMAL HIGH (ref 70–99)
Glucose-Capillary: 191 mg/dL — ABNORMAL HIGH (ref 70–99)
Glucose-Capillary: 206 mg/dL — ABNORMAL HIGH (ref 70–99)

## 2022-10-02 MED ORDER — TRAZODONE HCL 50 MG PO TABS
25.0000 mg | ORAL_TABLET | Freq: Every day | ORAL | Status: DC
  Administered 2022-10-02 – 2022-10-31 (×30): 25 mg via ORAL
  Filled 2022-10-02 (×30): qty 1

## 2022-10-02 MED ORDER — HYDROXYZINE HCL 25 MG PO TABS
25.0000 mg | ORAL_TABLET | Freq: Every day | ORAL | Status: DC
  Administered 2022-10-02 – 2022-10-31 (×30): 25 mg via ORAL
  Filled 2022-10-02 (×31): qty 1

## 2022-10-02 NOTE — Plan of Care (Signed)
  Problem: RH Balance Goal: LTG: Patient will maintain dynamic sitting balance (OT) Description: LTG:  Patient will maintain dynamic sitting balance with assistance during activities of daily living (OT) Flowsheets (Taken 10/02/2022 1244) LTG: Pt will maintain dynamic sitting balance during ADLs with: Contact Guard/Touching assist   Problem: RH Bathing Goal: LTG Patient will bathe all body parts with assist levels (OT) Description: LTG: Patient will bathe all body parts with assist levels (OT) Flowsheets (Taken 10/02/2022 1244) LTG: Pt will perform bathing with assistance level/cueing: Minimal Assistance - Patient > 75%   Problem: RH Dressing Goal: LTG Patient will perform upper body dressing (OT) Description: LTG Patient will perform upper body dressing with assist, with/without cues (OT). Flowsheets (Taken 10/02/2022 1244) LTG: Pt will perform upper body dressing with assistance level of: Minimal Assistance - Patient > 75% Goal: LTG Patient will perform lower body dressing w/assist (OT) Description: LTG: Patient will perform lower body dressing with assist, with/without cues in positioning using equipment (OT) Flowsheets (Taken 10/02/2022 1244) LTG: Pt will perform lower body dressing with assistance level of: Minimal Assistance - Patient > 75%   Problem: RH Toileting Goal: LTG Patient will perform toileting task (3/3 steps) with assistance level (OT) Description: LTG: Patient will perform toileting task (3/3 steps) with assistance level (OT)  Flowsheets (Taken 10/02/2022 1244) LTG: Pt will perform toileting task (3/3 steps) with assistance level: Minimal Assistance - Patient > 75%   Problem: RH Functional Use of Upper Extremity Goal: LTG Patient will use RT/LT upper extremity as a (OT) Description: LTG: Patient will use right/left upper extremity as a stabilizer/gross assist/diminished/nondominant/dominant level with assist, with/without cues during functional activity (OT) Flowsheets  (Taken 10/02/2022 1244) LTG: Use of upper extremity in functional activities: LUE as a stabilizer LTG: Pt will use upper extremity in functional activity with assistance level of: Independent with assistive device   Problem: RH Toilet Transfers Goal: LTG Patient will perform toilet transfers w/assist (OT) Description: LTG: Patient will perform toilet transfers with assist, with/without cues using equipment (OT) Flowsheets (Taken 10/02/2022 1244) LTG: Pt will perform toilet transfers with assistance level of: Minimal Assistance - Patient > 75%   Problem: RH Tub/Shower Transfers Goal: LTG Patient will perform tub/shower transfers w/assist (OT) Description: LTG: Patient will perform tub/shower transfers with assist, with/without cues using equipment (OT) Flowsheets (Taken 10/02/2022 1244) LTG: Pt will perform tub/shower stall transfers with assistance level of: Minimal Assistance - Patient > 75%   Problem: RH Memory Goal: LTG Patient will demonstrate ability for day to day recall/carry over during activities of daily living with assistance level (OT) Description: LTG:  Patient will demonstrate ability for day to day recall/carry over during activities of daily living with assistance level (OT). Flowsheets (Taken 10/02/2022 1244) LTG:  Patient will demonstrate ability for day to day recall/carry over during activities of daily living with assistance level (OT): Modified Independent

## 2022-10-02 NOTE — Progress Notes (Signed)
PROGRESS NOTE   Subjective/Complaints:  Pt doing alright today, didn't sleep well because she wasn't given the meds she was previously on during her hospitalization. States she was on ?trazodone '50mg'$  (not really clear on name but that's what it seems), and then also ?hydroxyzine but isn't quite sure of dose or if that's the one she's supposed to get at night, thinks it might have also been '50mg'$ . Would prefer having these scheduled instead of PRN, didn't know to ask for meds.  LBM was yesterday per documentation, but pt wasn't sure.  Urinating well.  Pain is going ok with meds.  Denies any other complaints or concerns today.   ROS: +insomnia +anxiety. Denies fevers, chills, CP, SOB, abd pain, N/V/D/C, new/worsening paresthesias/weakness, or any other complaints at this time.    Objective:   No results found. Recent Labs    09/30/22 0444 10/01/22 0406  WBC 7.9 6.8  HGB 11.8* 11.8*  HCT 36.0 38.1  PLT 248 267   Recent Labs    09/30/22 0444  NA 135  K 3.9  CL 97*  CO2 27  GLUCOSE 67*  BUN 26*  CREATININE 1.04*  CALCIUM 8.8*    Intake/Output Summary (Last 24 hours) at 10/02/2022 1314 Last data filed at 10/02/2022 0911 Gross per 24 hour  Intake 480 ml  Output 650 ml  Net -170 ml        Physical Exam: Vital Signs Blood pressure (!) 147/118, pulse 80, temperature 98.4 F (36.9 C), temperature source Oral, resp. rate 18, height '4\' 11"'$  (1.499 m), weight 86.4 kg, last menstrual period 06/22/2022, SpO2 90 %.  Constitutional: No distress . Vital signs reviewed. Obese.  HEENT: NCAT, EOMI, oral membranes moist. Kept eyes turned to the right mostly but able to turn to midline; closes her eyes often. Tongue with white coating--not reassessed today Neck: supple Cardiovascular: RRR without murmur. No JVD    Respiratory/Chest: CTA Bilaterally without wheezes or rales. Normal effort    GI/Abdomen: BS +, non-tender,  non-distended Ext: no clubbing or cyanosis, 1+ BLE edema Psych: flat, depressed, but appreciative of assistance Skin: dry, warm MSK: weak in L arm, able to move R arm around in bed, full MsK exam not performed.  Neurologic: Pt appears tired but awakens and responds to questions, does intermittently close eyes and seem to rest. Speech dysarthric, low volume. Language appears to be intact.   PRIOR EXAM: Neurologic: Right gaze preference but can follow to left to a lesser extent. No obvious field cut. Left central 7 and tongue deviation. LUE and LLE 0/5. Senses pain and LT in all 4's. No resting tone. DTR's 1+ throughout. Toes down left foot, no clonus.    Assessment/Plan: 1. Functional deficits which require 3+ hours per day of interdisciplinary therapy in a comprehensive inpatient rehab setting. Physiatrist is providing close team supervision and 24 hour management of active medical problems listed below. Physiatrist and rehab team continue to assess barriers to discharge/monitor patient progress toward functional and medical goals  Care Tool:  Bathing    Body parts bathed by patient: Chest, Abdomen, Front perineal area, Face   Body parts bathed by helper: Right arm, Left arm, Buttocks,  Right upper leg, Right lower leg, Left upper leg, Left lower leg     Bathing assist Assist Level: Total Assistance - Patient < 25%     Upper Body Dressing/Undressing Upper body dressing   What is the patient wearing?: Hospital gown only    Upper body assist Assist Level: Total Assistance - Patient < 25%    Lower Body Dressing/Undressing Lower body dressing      What is the patient wearing?: Hospital gown only     Lower body assist Assist for lower body dressing: Total Assistance - Patient < 25%     Toileting Toileting    Toileting assist Assist for toileting: Dependent - Patient 0%     Transfers Chair/bed transfer  Transfers assist  Chair/bed transfer activity did not occur:  Safety/medical concerns        Locomotion Ambulation   Ambulation assist   Ambulation activity did not occur: Safety/medical concerns          Walk 10 feet activity   Assist  Walk 10 feet activity did not occur: Safety/medical concerns        Walk 50 feet activity   Assist Walk 50 feet with 2 turns activity did not occur: Safety/medical concerns         Walk 150 feet activity   Assist Walk 150 feet activity did not occur: Safety/medical concerns         Walk 10 feet on uneven surface  activity   Assist Walk 10 feet on uneven surfaces activity did not occur: Safety/medical concerns         Wheelchair     Assist Is the patient using a wheelchair?: No   Wheelchair activity did not occur: Safety/medical concerns         Wheelchair 50 feet with 2 turns activity    Assist    Wheelchair 50 feet with 2 turns activity did not occur: Safety/medical concerns       Wheelchair 150 feet activity     Assist  Wheelchair 150 feet activity did not occur: Safety/medical concerns       Blood pressure (!) 147/118, pulse 80, temperature 98.4 F (36.9 C), temperature source Oral, resp. rate 18, height '4\' 11"'$  (1.499 m), weight 86.4 kg, last menstrual period 06/22/2022, SpO2 90 %.  Medical Problem List and Plan: 1. 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  Antithrombotics: -DVT/anticoagulation:  Pharmaceutical: Eliquis '5mg'$  BID             -antiplatelet therapy: N/A 3.Back pain/Pain Management:  Tylenol prn. Oxycodone making her nausea worse and ineffective--changed to MSIR '15mg'$  q6h PRN prior to d/c from acute, reordered here 4. Mood/Behavior/Sleep: LCSW to follow for evaluation and support.              -antipsychotic agents: N/A -pt reports chronic anxiety at  baseline. Expresses that she's depressed as well currently.              -took atarax at home '10mg'$  tid prn. Will resume.             -begin lexapro '5mg'$  qhs             -  neuropsych eval -10/02/22 pt unaware of PRN meds; would prefer scheduled Trazodone and Hydroxyzine, though she's not very clear on dosing or if these are the meds she was getting; will order Trazodone '25mg'$  QHS and Hydroxyzine '25mg'$  QHS for now, monitor for effect or oversedation; left  Hydroxyzine '10mg'$  TID PRN dosing for anxiety in place 5. Neuropsych/cognition: This patient is not capable of making decisions on her own behalf. 6. Skin/Wound Care:  Routine pressure relief  measures.  7. Fluids/Electrolytes/Nutrition: Monitor I/O. Check CMET on 03/11.  8. Acute on chronic combined CHF: HH diet.  -Monitor weights daily and for signs of overload. -continue Torsemide '40mg'$  QD, Losartan '50mg'$  QD, Spironolactone '25mg'$  QD, Jardiance '10mg'$  QD, and Metoprolol '25mg'$  BID -10/02/22 wt and BP stable, monitor Filed Weights   10/01/22 1350 10/02/22 0535  Weight: 86.2 kg 86.4 kg    Vitals:   10/01/22 1350 10/01/22 2005 10/02/22 0411  BP: (!) 134/99 (!) 128/104 (!) 147/118    9. T2DM: Hgb A1C-10.8  (improving >15 @ 11/23). Was on Lantus 15u w/ novolog 12 units with supper>>not restarted             --monitor BS ac/hs and use SSI for elevated BS             --on SSI, Jardiance '10mg'$  QD -10/02/22 CBGs well controlled with 2-3U of novolog use per meal so far, monitor for trends CBG (last 3)  Recent Labs    10/01/22 2036 10/02/22 0529 10/02/22 1153  GLUCAP 129* 125* 163*    10. Acute on chronic renal failure: Encourage fluid intake. Recheck CMET on 03/11 11. Severe obesity: Educate patient on diet, compliance and exercise to help promote health and mobility.  12. Hyperlipidemia: continue lipitor '80mg'$  QHS   LOS: 1 days A FACE TO [place] 10/02/2022, 1:14 PM

## 2022-10-02 NOTE — Evaluation (Signed)
Physical Therapy Assessment and Plan  Patient Details  Name: [name] MRN: [id number] Date of Birth: 1975/08/20  PT Diagnosis: Abnormal posture, Hemiplegia non-dominant, Impaired cognition, Impaired sensation, Low back pain, and Muscle weakness Rehab Potential: Good ELOS: 3-4 weeks.   Today's Date: 10/02/2022 PT Individual Time: 1103-1200 PT Individual Time Calculation (min): 57 min    Hospital Problem: Principal Problem:   Acute ischemic right MCA stroke [facility])   Past Medical History:  Past Medical History:  Diagnosis Date   Abnormal Pap smear    Acute combined systolic and diastolic heart failure ([place]) 02/19/2019   Wt Readings from Last 3 Encounters:  09/18/21  84.7 kg  09/02/21  84.4 kg  08/31/21  84.2 kg         Acute heart failure ([place]) 01/29/2022   Acute on chronic combined systolic and diastolic CHF (congestive heart failure) ([place]) 02/19/2019   Acute on chronic congestive heart failure (HCC)    Acute on chronic congestive heart failure (HCC)    Acute on chronic diastolic CHF (congestive heart failure) ([place]) 10/19/2021   Acute on chronic systolic heart failure ([place]) 02/22/2022   Anemia    Asthma    Atrial fibrillation ([place])    history of paroxysmal atrial fibrillation   CHF (congestive heart failure) ([place]) 11/02/2021   CHF exacerbation ([place]) 10/17/2021   Diabetes mellitus    DKA, type 2 ([place]) 02/06/2019   Elevated troponin 09/30/2019   Heart failure with reduced ejection fraction ([place])    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     Migraines    Morbid obesity ([place]) 02/06/2019   Nausea & vomiting  06/26/2022   Nonischemic cardiomyopathy ([place]) 09/22/2021   NSTEMI (non-ST elevated myocardial infarction) ([place]) 02/23/2022   Obstructive sleep apnea 06/30/2015   Polycystic ovarian disease    Past Surgical History:  Past Surgical History:  Procedure Laterality Date   BIOPSY  11/05/2021   Procedure: BIOPSY;  Surgeon: Daryel November, MD;  Location: [place];  Service: Gastroenterology;;   CERVIX LESION DESTRUCTION     CESAREAN SECTION     ESOPHAGOGASTRODUODENOSCOPY (EGD) WITH PROPOFOL N/A 11/05/2021   Procedure: ESOPHAGOGASTRODUODENOSCOPY (EGD) WITH PROPOFOL;  Surgeon: Daryel November, MD;  Location: [facility] ENDOSCOPY;  Service: Gastroenterology;  Laterality: N/A;   IR CT HEAD LTD  09/24/2022   IR PERCUTANEOUS ART THROMBECTOMY/INFUSION INTRACRANIAL INC DIAG ANGIO  09/24/2022   RADIOLOGY WITH ANESTHESIA N/A 09/24/2022   Procedure: IR WITH ANESTHESIA;  Surgeon: Radiologist, Medication, MD;  Location: [place];  Service: Radiology;  Laterality: N/A;   RIGHT/LEFT HEART CATH AND CORONARY ANGIOGRAPHY N/A 03/29/2019   Procedure: RIGHT/LEFT HEART CATH AND CORONARY ANGIOGRAPHY;  Surgeon: Larey Dresser, MD;  Location: [place] CV LAB;  Service: Cardiovascular;  Laterality: N/A;   RIGHT/LEFT HEART CATH AND CORONARY ANGIOGRAPHY N/A 09/20/2021   Procedure: RIGHT/LEFT HEART CATH AND CORONARY ANGIOGRAPHY;  Surgeon: Larey Dresser, MD;  Location: [place] CV LAB;  Service: Cardiovascular;  Laterality: N/A;    Assessment & Plan Clinical Impression: [name] is a 47 year old female withhistory fo T2DM, NICM w/chronic systolic/diastolic HF, PAF  w/LV thrombus who was presented to ED on 09/22/22 with reports of SOB and CP  X 2 weeks with BLE edema.CTA chest negative for PE and showed anasarca. She was found to have elevated troponins with acute on chronic combined CHF and started on IV Heparin as well as IV diuresis. Cardiology was consulted and felt that patient with demand ischemia due to CHF as  EKG without ST changes.  She continued to have issues with SOB and medication being adjusted by cards with recommendations to transition patient to Eliquis due to noncompliance hx. On afternoon of 03/02, she developed acute onset of right gaze deviation with left hemiplegia. CTA  head/neck done revealing loss of gray white differentiation in insula likely reflecting early infarct, proximal right M2 occlusion with minimal flow throughout the MCA division. She underwent cerebral angio with endovascular revascularization of occluded inferior division of M2 segment of R-MCA with aspiration and without significant change w/TICI 2B revascularization.  She did have briefe cardiac arrest during IR procedure requiring 2 minutes ACLS prior to ROSC.    2D echo done revealing large LV apical thrombus with severe reduction in LVEF with global hypokinesis, EF 15% and moderate TVR. MRI brain done revealing large area of infarct in R-MCA distribution with small area in right ACA distribution and acute punctate infarct in left cerebellar hemisphere. Dr. Erlinda Hong felt that stroke was "likely embolic from LV thrombus with Low EF and PAF even while on heparin". She was kept intubated till mental status improved and self extubated on 03/05. Respiratory status stable, lethargy was slowly resolving, cortak removed and diet advanced to D3, thins. She was transitioned to Apixaban 03/08   Therapy has been working with patient who continues to be limited by left hemiplegia with left inattention and right gaze preference, decreased balance, lacks awareness of deficits, impulsivity with delay in processing and ability to follow one step commands as well as perseveration on going home. CIR recommended due to functional decline.   Patient currently requires max with mobility secondary to muscle paralysis and abnormal tone, decreased coordination, and decreased motor planning.  Prior to hospitalization, patient was independent  with mobility and  lived with Alone in a [place] home.  Home access is 1Stairs to enter.  Patient will benefit from skilled PT intervention to maximize safe functional mobility, minimize fall risk, and decrease caregiver burden for planned discharge home with 24 hour assist.  Anticipate patient will benefit from follow up [facility] at discharge.  PT - End of Session Activity Tolerance: Tolerates 10 - 20 min activity with multiple rests Endurance Deficit: Yes PT Assessment Rehab Potential (ACUTE/IP ONLY): Good PT Barriers to Discharge: Home environment access/layout PT Barriers to Discharge Comments: bed and bath 2nd floor. PT Patient demonstrates impairments in the following area(s): Balance;Perception;Safety;Endurance;Motor;Pain PT Transfers Functional Problem(s): Bed Mobility;Bed to Chair;Car;Furniture PT Locomotion Functional Problem(s): Ambulation;Wheelchair Mobility;Stairs PT Plan PT Intensity: Minimum of 1-2 x/day ,45 to 90 minutes PT Frequency: 5 out of 7 days PT Duration Estimated Length of Stay: 3-4 weeks. PT Treatment/Interventions: Ambulation/gait training;Community reintegration;DME/adaptive equipment instruction;Neuromuscular re-education;Stair training;UE/LE Strength taining/ROM;Wheelchair propulsion/positioning;Balance/vestibular training;Discharge planning;Functional electrical stimulation;Therapeutic Activities;UE/LE Coordination activities;Functional mobility training;Patient/family education;Therapeutic Exercise PT Transfers Anticipated Outcome(s): CGA PT Locomotion Anticipated Outcome(s): min A w/ LRAD. PT Recommendation Recommendations for Other Services: Speech consult Follow Up Recommendations: Home health PT Patient destination: Home Equipment Recommended: To be determined   PT Evaluation Precautions/Restrictions Precautions Precautions: Fall Precaution Comments: Left hemiparesis Required Braces or Orthoses: Other Brace Other Brace: L Resting hand splint  and L  PRAFO Restrictions Weight Bearing Restrictions: No General Chart Reviewed: Yes Family/Caregiver Present: No Vital Signs Pain Pain Assessment Pain Scale: 0-10 Pain Score: 9  Pain Type: Acute pain Pain Location: Back Pain Orientation: Mid Pain Descriptors / Indicators: Aching Pain Intervention(s): Rest Pain Interference Pain Interference Pain Effect on Sleep: 3. Frequently Pain Interference with Therapy Activities: 2. Occasionally Pain Interference with Day-to-Day Activities: 3. Frequently Home Living/Prior Functioning Home Living Available Help at Discharge: Family;Friend(s);Available 24 hours/day Type of Home: Apartment Home Access: Stairs to enter Entrance Stairs-Number of Steps: 1 Home Layout: Two level;1/2 bath on main level;Bed/bath upstairs Alternate Level Stairs-Number of Steps: 12-14 Alternate Level Stairs-Rails: Right Bathroom Shower/Tub: Tub/shower unit (Long-handled shower head) Bathroom Toilet: Standard Bathroom Accessibility: Yes Additional Comments: In the process of getting a handicapped accessible apartment, father and daughter's home could also be discharge options.  Lives With: Alone Prior Function Level of Independence: Independent with transfers;Independent with gait  Able to Take Stairs?: Yes Driving: Yes Vocation: Full time employment Vision/Perception  Vision - History Ability to See in Adequate Light: 1 Impaired Vision - Assessment Eye Alignment: Within Functional Limits Ocular Range of Motion: Within Functional Limits;Other (comment) (Slow to move, potential due to fatigue.) Alignment/Gaze Preference: Within Defined Limits Tracking/Visual Pursuits: Able to track stimulus in all quads without difficulty (Slow to track, potentially due to fatigue.) Saccades: Decreased speed of saccadic movement Convergence: Within functional limits  Cognition Overall Cognitive Status: Within Functional Limits for tasks assessed Arousal/Alertness: Lethargic (Pt  continually falling asleep, states "didn't sleep well last night") Orientation Level: Oriented X4 Attention: Selective Sustained Attention: Impaired Memory: Impaired Awareness: Appears intact Problem Solving: Appears intact Safety/Judgment: Appears intact Sensation Sensation Light Touch: Appears Intact Peripheral sensation comments: Tingling with movement. Light Touch Impaired Details: Impaired LUE;Impaired LLE Hot/Cold: Appears Intact Proprioception: Impaired Detail Proprioception Impaired Details: Impaired LUE;Impaired LLE Stereognosis: Impaired Detail Stereognosis Impaired Details: Impaired LUE;Impaired LLE Coordination Gross Motor Movements are Fluid and Coordinated: No Fine Motor Movements are Fluid and Coordinated: No Coordination and Movement Description: Impaired due to L hemiparesis Heel Shin Test: NT Motor  Motor Motor: Hemiplegia   Trunk/Postural Assessment  Cervical Assessment Cervical Assessment: Exceptions to [facility] (forward head) Thoracic Assessment Thoracic Assessment: Exceptions to [facility] (kyphosis.) Lumbar Assessment Lumbar Assessment: Exceptions to [facility] (post pelvic tilt.) Postural Control Postural Control: Deficits on evaluation Trunk Control: Pt with L-lateral lean when sitting without RUE support. Protective Responses: decreased to absent for lean.  Balance Balance Balance Assessed: Yes Static Sitting Balance Static Sitting - Balance Support: Right upper extremity supported;Feet supported Static Sitting - Level of Assistance: 3: Mod assist Static Sitting - Comment/# of Minutes: 2-3" before requesting urinal Dynamic Sitting Balance Dynamic Sitting - Balance Support: Feet supported;Right upper extremity supported Dynamic Sitting - Level of Assistance: 3: Mod assist (unable to perform 2/2 lethargy.) Dynamic Sitting - Balance Activities: Reaching across midline Extremity Assessment  RUE Assessment RUE Assessment: Within Functional Limits LUE  Assessment LUE Assessment: Exceptions to [facility] LUE Body System: Neuro Brunstrum levels for arm and hand: Arm;Hand Brunstrum level for arm: Stage I Presynergy Brunstrum level for hand: Stage I Flaccidity LUE AROM (degrees) Overall AROM Left Upper Extremity: Deficits LUE Overall AROM Comments: No activiation noted. LUE Strength LUE Overall Strength: Deficits LUE Overall Strength Comments: No activated noted. LUE Tone LUE Tone: Flaccid RLE Assessment General Strength Comments: appears WFL, unable to perform full MMT d/t lethargy LLE Assessment LLE Assessment: Exceptions to [facility] General Strength Comments: 0/5 throughout.  Care Tool Care  Tool Bed Mobility Roll left and right activity   Roll left and right assist level: Maximal Assistance - Patient 25 - 49% (max A to right, min A to Left w/ side rails.)    Sit to lying activity   Sit to lying assist level: Maximal Assistance - Patient 25 - 49%    Lying to sitting on side of bed activity   Lying to sitting on side of bed assist level: the ability to move from lying on the back to sitting on the side of the bed with no back support.: Maximal Assistance - Patient 25 - 49%     Care Tool Transfers Sit to stand transfer Sit to stand activity did not occur: Safety/medical concerns      Chair/bed transfer Chair/bed transfer activity did not occur: Safety/medical concerns       Toilet transfer Toilet transfer activity did not occur: Safety/medical concerns      Scientist, product/process development transfer activity did not occur: Safety/medical concerns        Care Tool Locomotion Ambulation Ambulation activity did not occur: Safety/medical concerns        Walk 10 feet activity Walk 10 feet activity did not occur: Safety/medical concerns       Walk 50 feet with 2 turns activity Walk 50 feet with 2 turns activity did not occur: Safety/medical concerns      Walk 150 feet activity Walk 150 feet activity did not occur: Safety/medical concerns       Walk 10 feet on uneven surfaces activity Walk 10 feet on uneven surfaces activity did not occur: Safety/medical concerns      Stairs Stair activity did not occur: Safety/medical concerns        Walk up/down 1 step activity Walk up/down 1 step or curb (drop down) activity did not occur: Safety/medical concerns      Walk up/down 4 steps activity Walk up/down 4 steps activity did not occur: Safety/medical concerns      Walk up/down 12 steps activity Walk up/down 12 steps activity did not occur: Safety/medical concerns      Pick up small objects from floor Pick up small object from the floor (from standing position) activity did not occur: Safety/medical concerns      Wheelchair Is the patient using a wheelchair?: No   Wheelchair activity did not occur: Safety/medical concerns      Wheel 50 feet with 2 turns activity Wheelchair 50 feet with 2 turns activity did not occur: Safety/medical concerns    Wheel 150 feet activity Wheelchair 150 feet activity did not occur: Safety/medical concerns      Refer to Care Plan for Long Term Goals  SHORT TERM GOAL WEEK 1 PT Short Term Goal 1 (Week 1): Pt will roll side to side w/ min A. PT Short Term Goal 2 (Week 1): Pt will transfer sup to sit w/ mod A PT Short Term Goal 3 (Week 1): Pt will maintain sitting balance x 5' w/ min A PT Short Term Goal 4 (Week 1): PT to assess gait.  Recommendations for other services: Other: ST  Skilled Therapeutic Intervention Evaluation completed (see details above and below) with education on PT POC and goals and individual treatment initiated with focus on  NMR, balance, strengthening, endurance, transfers and progress to OOB.  Pt presents supine in bed and very lethargic, falling asleep immediately after answering questions.  Pt states need for bedpan/urinal.  Pt unable to control, stating bladder incontinence.  Pt does roll to  left w/ min A and use of siderail, but max to right for changing to clean brief.   Bed continually lowers as well as changes to reverse Trendelenburg, nursing ordering new bed.  Pt transfers sidelying to sit w/ max A, but unable to maintain sitting 2/2 reverse Trendelenburg positioning of bed.  Unable to truly test sitting balance or true pushing status 2/2 positioning of bed.  Pt required mod A for seated balance, and able to transition to R FA/elbow.  Pt states need for urinal again, unable to mobilize OOB.  Pt requires max A for reverse log roll, for LES.  Pt scooted to [facility] w/ nursing.  Pt handed off to nursing for bed change.    Mobility Bed Mobility Bed Mobility: Rolling Right;Rolling Left;Supine to Sit;Sit to Supine Rolling Right: Moderate Assistance - Patient 50-74% Rolling Left: Minimal Assistance - Patient > 75% Supine to Sit: Maximal Assistance - Patient - Patient 25-49% Sit to Supine: Maximal Assistance - Patient 25-49% Locomotion  Gait Ambulation: No Stairs / Additional Locomotion Stairs: No Wheelchair Mobility Wheelchair Mobility: No   Discharge Criteria: Patient will be discharged from PT if patient refuses treatment 3 consecutive times without medical reason, if treatment goals not met, if there is a change in medical status, if patient makes no progress towards goals or if patient is discharged from hospital.  The above assessment, treatment plan, treatment alternatives and goals were discussed and mutually agreed upon: by patient  Ladoris Gene 10/02/2022, 12:20 PM

## 2022-10-02 NOTE — Progress Notes (Signed)
Meredith Staggers, MD  Physician Physical Medicine and Rehabilitation   PMR Pre-admission    Signed   Date of Service: 09/29/2022  4:07 PM  Related encounter: ED to Hosp-Admission (Discharged) from 09/22/2022 in [facility]   Signed      Show:Clear all '[x]'$ Written'[x]'$ Templated'[]'$ Copied  Added by: '[x]'$ Cristina Gong, RN'[x]'$ Lind Covert, Lauren Mamie Nick, CCC-SLP'[x]'$ Meredith Staggers, MD  '[]'$ Hover for details PMR Admission Coordinator Pre-Admission Assessment   Patient: [name] is an 47 y.o., female MRN: [id number] DOB: 1976/04/07 Height: 4' 11.02" (149.9 cm) Weight: 90.3 kg   Insurance Information HMO:     PPO:      PCP:      IPA:      80/20:      OTHER:  PRIMARY: Medicare a and b      Policy#: [id number]      Subscriber: pt Benefits:  Phone #: passport one source online     Name: 3/6 Eff. Date: 03/25/2022     Deduct: $1632      Out of Pocket Max: none      Life Max: none CIR: 100%      SNF: 20 full days Outpatient: 80%     Co-Pay: 20% Home Health: 100%      Co-Pay: none DME: 80%     Co-Pay: 20% Providers: pt choice  SECONDARY: Medicaid [place] Access      Policy#: [id number] b 3/1 MADQY   Financial Counselor:       Phone#:    The "Data Collection Information Summary" for patients in Inpatient Rehabilitation Facilities with attached "Privacy Act [place] Records" was provided and verbally reviewed with: Patient and Family   Emergency Contact Information Contact Information       Name Relation Home Work Mobile    Tyronza Daughter     [phone]    Shanara, Daube Daughter     [phone]    Jolyne Loa     [phone]    Joselynne, Agins Father     [phone]         Current Medical History  Patient Admitting Diagnosis: CVA   History of Present Illness: 47 year old female who presented on 09/22/22 with SOB secondary to combined CHF exacerbation.  Past medical history of acute combined systolic and diastolic CHF, LV  thrombus, prior NSTEMMI, PE, afib , type 2 DM< HTN, depression and OSA. Reports 2 week history of LE edema, SOB and chest pain.    Neurology consult on 3/2 for code stroke due to left sided weakness. She underwent IR procedure due to right M2 occlusion. She was subtherapeutic INR of 1.3. CTA head revealed likely evolving CVA with right M2 occulusion with minimal collateral flow throughout the MCA distribution. On induction for thrombectomy, patient lost BP followed by loss of pulse. Remained intubated postprocedure. Heparin gtt began. Self extubated on 3/5. 2 D echo with large LV apical thrombus, LVEF 15 %. Was on Warfarin pta. Found to be noncompliant with coumadin an INR checks. Will switch to Eliquis form heparin Gtt. To resume Toprol and spironolactone and Losartan. To resume Jardiance when taking po. LDL 97. Placed on Lipitor. Hgb A1c 10.8. home meds on Novolog flex pen and Jardiance.    Complete NIHSS TOTAL: 13   Patient's medical record from [facility] has been reviewed by the rehabilitation admission coordinator and physician.   Past Medical History      Past Medical History:  Diagnosis Date   Abnormal Pap smear  Acute combined systolic and diastolic heart failure ([place]) 02/19/2019    Wt Readings from Last 3 Encounters:  09/18/21  84.7 kg  09/02/21  84.4 kg  08/31/21  84.2 kg         Acute heart failure ([place]) 01/29/2022   Acute on chronic combined systolic and diastolic CHF (congestive heart failure) ([place]) 02/19/2019   Acute on chronic congestive heart failure (HCC)     Acute on chronic congestive heart failure (HCC)     Acute on chronic diastolic CHF (congestive heart failure) ([place]) 10/19/2021   Acute on chronic systolic heart failure ([place]) 02/22/2022   Anemia     Asthma     Atrial fibrillation ([place])      history of paroxysmal atrial fibrillation   CHF (congestive heart failure) ([place]) 11/02/2021   CHF exacerbation ([name]) 10/17/2021   Diabetes mellitus     DKA, type 2  ([place]) 02/06/2019   Elevated troponin 09/30/2019   Heart failure with reduced ejection fraction ([place])     History of Left ventricular thrombus 09/22/2021   Hydrosalpinx 08/21/2009    Dx on Pelvic US in Jan 2011.  Consider chronic etiology given continued fertility issues   Hypertension     Hypertensive emergency 09/30/2019   Hypertensive urgency 06/02/2022   Major depressive disorder 02/08/2012    Reports that 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     Migraines     Morbid obesity ([place]) 02/06/2019   Nausea & vomiting 06/26/2022   Nonischemic cardiomyopathy ([place]) 09/22/2021   NSTEMI (non-ST elevated myocardial infarction) ([place]) 02/23/2022   Obstructive sleep apnea 06/30/2015   Polycystic ovarian disease      Has the patient had major surgery during 100 days prior to admission? Yes   Family History   family history includes Diabetes in her father and mother; Hypertension in her father and mother.   Current Medications   Current Facility-Administered Medications:    acetaminophen (TYLENOL) tablet 650 mg, 650 mg, Oral, Q4H PRN, 650 mg at 09/30/22 1157 **OR** acetaminophen (TYLENOL) 160 MG/5ML solution 650 mg, 650 mg, Per Tube, Q4H PRN, 650 mg at 09/29/22 1327 **OR** acetaminophen (TYLENOL) suppository 650 mg, 650 mg, Rectal, Q4H PRN, Deveshwar, Sanjeev, MD   apixaban (ELIQUIS) tablet 5 mg, 5 mg, Oral, BID, Rosalin Hawking, MD, 5 mg at 09/30/22 0929   atorvastatin (LIPITOR) tablet 80 mg, 80 mg, Oral, QHS, Rosalin Hawking, MD, 80 mg at 09/29/22 2150   Chlorhexidine Gluconate Cloth 2 % PADS 6 each, 6 each, Topical, Q0600, Freddi Starr, MD, 6 each at 09/30/22 0145   docusate (COLACE) 50 MG/5ML liquid 100 mg, 100 mg, Oral, BID, Rosalin Hawking, MD, 100 mg at 09/30/22 [id number]   empagliflozin (JARDIANCE) tablet 10 mg, 10 mg, Oral, Daily, Rosalin Hawking, MD, 10 mg at 09/30/22 0929   insulin aspart (novoLOG) injection 0-15 Units, 0-15 Units,  Subcutaneous, TID WC, Wilson Singer I, RPH, 3 Units at 09/30/22 1251   insulin aspart (novoLOG) injection 0-5 Units, 0-5 Units, Subcutaneous, QHS, Wilson Singer I, RPH   losartan (COZAAR) tablet 50 mg, 50 mg, Oral, Daily, Rosalin Hawking, MD, 50 mg at 09/30/22 0929   metoprolol tartrate (LOPRESSOR) tablet 25 mg, 25 mg, Oral, BID, Rosalin Hawking, MD, 25 mg at 09/30/22 [id number]   multivitamin with minerals tablet 1 tablet, 1 tablet, Oral, Daily, Rosalin Hawking, MD, 1 tablet at 09/30/22 0929   ondansetron (ZOFRAN) injection 4 mg, 4 mg,  Intravenous, Q6H PRN, Collene Gobble, MD, 4 mg at 09/30/22 1533   Oral care mouth rinse, 15 mL, Mouth Rinse, PRN, Freddi Starr, MD   Oral care mouth rinse, 15 mL, Mouth Rinse, Q4H, Rosalin Hawking, MD, 15 mL at 09/30/22 1544   oxyCODONE (Oxy IR/ROXICODONE) immediate release tablet 5-10 mg, 5-10 mg, Oral, Q4H PRN, Rosalin Hawking, MD, 10 mg at 09/30/22 1511   pantoprazole (PROTONIX) injection 40 mg, 40 mg, Intravenous, QHS, Freda Jackson B, MD, 40 mg at 09/29/22 2150   phenol (CHLORASEPTIC) mouth spray 1 spray, 1 spray, Mouth/Throat, PRN, Amie Portland, MD, 1 spray at 09/29/22 0233   polyethylene glycol (MIRALAX / GLYCOLAX) packet 17 g, 17 g, Oral, Daily, Rosalin Hawking, MD   spironolactone (ALDACTONE) tablet 25 mg, 25 mg, Oral, Daily, Rosalin Hawking, MD, 25 mg at 09/30/22 [id number]   torsemide (DEMADEX) tablet 40 mg, 40 mg, Oral, Daily, Rosalin Hawking, MD, 40 mg at 09/30/22 [id number]   Patients Current Diet:  Diet Order                  DIET DYS 3 Room service appropriate? Yes with Assist; Fluid consistency: Thin  Diet effective now                       Precautions / Restrictions Precautions Precautions: Fall, Other (comment) Precaution Comments: left hemiparesis, cortrak Other Brace: Resting hand splint and Left PRAFO Restrictions Weight Bearing Restrictions: No    Has the patient had 2 or more falls or a fall with injury in the past year? No   Prior Activity  Level Community (5-7x/wk): Independent without AD, does not drive   Prior Functional Level Self Care: Did the patient need help bathing, dressing, using the toilet or eating? Independent   Indoor Mobility: Did the patient need assistance with walking from room to room (with or without device)? Independent   Stairs: Did the patient need assistance with internal or external stairs (with or without device)? Independent   Functional Cognition: Did the patient need help planning regular tasks such as shopping or remembering to take medications? Independent   Patient Information Are you of Hispanic, Latino/a,or Spanish origin?: A. No, not of Hispanic, Latino/a, or Spanish origin What is your race?: B. Black or African American Do you need or want an interpreter to communicate with a doctor or health care staff?: 0. No   Patient's Response To:  Health Literacy and Transportation Is the patient able to respond to health literacy and transportation needs?: Yes Health Literacy - How often do you need to have someone help you when you read instructions, pamphlets, or other written material from your doctor or pharmacy?: Never In the past 12 months, has lack of transportation kept you from medical appointments or from getting medications?: No In the past 12 months, has lack of transportation kept you from meetings, work, or from getting things needed for daily living?: No   Development worker, international aid / [place] Devices/Equipment: None Home Equipment: Hand held shower head   Prior Device Use: Indicate devices/aids used by the patient prior to current illness, exacerbation or injury? None of the above   Current Functional Level Cognition   Arousal/Alertness: Awake/alert Overall Cognitive Status: Impaired/Different from baseline Difficult to assess due to: Impaired communication, Level of arousal Orientation Level: Oriented X4 Following Commands: Follows one step commands with  increased time Safety/Judgement: Decreased awareness of safety, Decreased awareness of deficits General Comments: slow to follow  commands, able to express needs and remember family phone numbers. seems unaware of errors with use of phone Attention: Focused, Sustained Focused Attention: Appears intact Sustained Attention: Impaired Sustained Attention Impairment: Verbal basic Memory: Impaired Memory Impairment: Retrieval deficit (Immediate: 5/5 with repetition x2; delayed: 1/5; with cues: 1/4) Awareness: Impaired Awareness Impairment: Intellectual impairment Problem Solving: Impaired Problem Solving Impairment: Verbal basic    Extremity Assessment (includes Sensation/Coordination)   Upper Extremity Assessment: LUE deficits/detail RUE Deficits / Details: WFL A/ROM assessed in bed all ranges LUE Deficits / Details: No active movement present during session. Resting hand splint applied at end of session. Therapist will do skin check later today. LUE Sensation: decreased light touch LUE Coordination: decreased fine motor, decreased gross motor  Lower Extremity Assessment: Defer to PT evaluation RLE Deficits / Details: AAROM WFL; hip flexion 3, extension 3+, knee extension 3+, RLE Coordination: WNL LLE Deficits / Details: PROM WFL; moves 1st toe to painful stimulus at 2nd toe, no other active movement noted     ADLs   Overall ADL's : Needs assistance/impaired Eating/Feeding: Independent Grooming: Wash/dry hands, Wash/dry face, Supervision/safety, Standing Upper Body Bathing: Supervision/ safety, Set up Lower Body Bathing: Supervison/ safety, Set up Upper Body Dressing : Set up Lower Body Dressing: Set up Toilet Transfer: Supervision/safety, Ambulation, Comfort height toilet Toileting- Clothing Manipulation and Hygiene: Supervision/safety Functional mobility during ADLs: Supervision/safety General ADL Comments: total (A) for all care at this time     Mobility   Overal bed mobility:  Needs Assistance Bed Mobility: Rolling, Sidelying to Sit Rolling: Mod assist Sidelying to sit: Mod assist General bed mobility comments: modA to roll to R and modA to elevate trunk to sitting. mod A to scoot to EOB and then pt needing support to maintain static sitting balance (leaning forwards and to L)     Transfers   Overall transfer level: Needs assistance Equipment used: 2 person hand held assist Transfers: Sit to/from Stand, Bed to chair/wheelchair/BSC Sit to Stand: Mod assist, +2 physical assistance Bed to/from chair/wheelchair/BSC transfer type:: Stand pivot Stand pivot transfers: Mod assist, +2 physical assistance General transfer comment: modA to rise to standing with assist to block L knee and extend at hips. pt completed x5 in session. cues for wt shift to R. unable to clear feet for steps to recliner     Ambulation / Gait / Stairs / Wheelchair Mobility   Ambulation/Gait Ambulation/Gait assistance: Counsellor (Feet): 30 Feet Assistive device: None Gait Pattern/deviations: Step-through pattern, Decreased stride length General Gait Details: unable Gait velocity: decreased Gait velocity interpretation: <1.31 ft/sec, indicative of household ambulator Pre-gait activities: standing wt shift x 10     Posture / Balance Dynamic Sitting Balance Sitting balance - Comments: mild left lean in sitting Balance Overall balance assessment: Needs assistance Sitting-balance support: Single extremity supported, Feet supported Sitting balance-Leahy Scale: Poor Sitting balance - Comments: mild left lean in sitting Standing balance support: Bilateral upper extremity supported Standing balance-Leahy Scale: Poor Standing balance comment: also left lean, able to manage wt shift to R with direct cues but unable to maintain     Special needs/care consideration OSA but does not use home CPAP Hb A1c 10.8 Noncompliant with coumadin and INR checks per MD pta 10 fr feeding tube 64 cm  placed on 3/4      Previous Home Environment  Living Arrangements: Alone  Lives With: Alone Available Help at Discharge: Family, Friend(s), Available 24 hours/day (daughter, dtr's partner, second dtr, and family) Type of Home:  Apartment Home Layout: Two level, 1/2 bath on main level, Bed/bath upstairs Alternate Level Stairs-Rails: Right Alternate Level Stairs-Number of Steps: flight Home Access: Stairs to enter Entrance Stairs-Number of Steps: 1 Bathroom Shower/Tub: Optometrist: Yes Home Care Services: No Additional Comments: in the process of getting a handicapped accessible apartment   Discharge Living Setting Plans for Discharge Living Setting: Lives with (comment) (will move in with dtr, Kennita's home) Type of Home at Discharge: House Discharge Home Layout: Two level, Able to live on main level with bedroom/bathroom Discharge Home Access: Stairs to enter Entrance Stairs-Rails: None Entrance Stairs-Number of Steps: 2-3 steps Discharge Bathroom Shower/Tub: Tub/shower unit Discharge Bathroom Toilet: Standard Discharge Bathroom Accessibility: Yes How Accessible: Accessible via walker Does the patient have any problems obtaining your medications?: No   Kennita's address is [place], [place] ( ? Public housing)   Radiographer, therapeutic Patient Roles: Parent, Other (Comment) (employee) Contact Information: daughter, Leroy Kennedy Anticipated Caregiver: 2 daughters, Kennita's partner, pt's Dad and family Anticipated Caregiver's Contact Information: see contacts Ability/Limitations of Caregiver: family works but will arrange coverage 24/7 Caregiver Availability: 24/7 Discharge Plan Discussed with Primary Caregiver: Yes Is Caregiver In Agreement with Plan?: Yes Does Caregiver/Family have Issues with Lodging/Transportation while Pt is in Rehab?: No   Goals Patient/Family Goal for Rehab: min assist with PT , OT and SLP.  Aware may be at wheelchair level Expected length of stay: ELOS 14 to 20 days Additional Information: Pt's boyfriend, Corene Cornea, is also involved Pt/Family Agrees to Admission and willing to participate: Yes Program Orientation Provided & Reviewed with Pt/Caregiver Including Roles  & Responsibilities: Yes   Leroy Kennedy is aware likely will be wheelchair level goals   Decrease burden of Care through IP rehab admission: n/a   Possible need for SNF placement upon discharge: not anticipated   Patient Condition: I have reviewed medical records from [facility], spoken with CM, and patient, daughter, and family member. I met with patient at the bedside for inpatient rehabilitation assessment.  Patient will benefit from ongoing PT, OT, and SLP, can actively participate in 3 hours of therapy a day 5 days of the week, and can make measurable gains during the admission.  Patient will also benefit from the coordinated team approach during an Inpatient Acute Rehabilitation admission.  The patient will receive intensive therapy as well as Rehabilitation physician, nursing, social worker, and care management interventions.  Due to bladder management, bowel management, safety, skin/wound care, disease management, medication administration, pain management, and patient education the patient requires 24 hour a day rehabilitation nursing.  The patient is currently mod ot max asisst with mobility and basic ADLs.  Discharge setting and therapy post discharge at home with home health is anticipated.  Patient has agreed to participate in the Acute Inpatient Rehabilitation Program and will admit Saturday 3/9.   Preadmission Screen Completed By:  Cleatrice Burke, RN MSN 09/30/2022 4:42 PM ______________________________________________________________________   Discussed status with Dr. Naaman Plummer on 09/30/22 at 1500 and received approval for admission 3/9 when bed is available.   Admission Coordinator:  Cleatrice Burke, RN MSN time 1500 Date 09/30/22    Assessment/Plan: Diagnosis: RIght MCA infarct embolic Does the need for close, 24 hr/day Medical supervision in concert with the patient's rehab needs make it unreasonable for this patient to be served in a less intensive setting? Yes Co-Morbidities requiring supervision/potential complications: CHF, LV thrombus, PE, a fib, dm, htn, depression, OSA Due to bladder management,  bowel management, safety, skin/wound care, disease management, medication administration, pain management, and patient education, does the patient require 24 hr/day rehab nursing? Yes Does the patient require coordinated care of a physician, rehab nurse, PT, OT, and SLP to address physical and functional deficits in the context of the above medical diagnosis(es)? Yes Addressing deficits in the following areas: balance, endurance, locomotion, strength, transferring, bowel/bladder control, bathing, dressing, feeding, grooming, toileting, cognition, speech, and psychosocial support Can the patient actively participate in an intensive therapy program of at least 3 hrs of therapy 5 days a week? Yes The potential for patient to make measurable gains while on inpatient rehab is excellent Anticipated functional outcomes upon discharge from inpatient rehab: min assist PT, min assist OT, min assist SLP Estimated rehab length of stay to reach the above functional goals is: 14-20 days Anticipated discharge destination: Home 10. Overall Rehab/Functional Prognosis: excellent     MD Signature: Meredith Staggers, MD, [place] Director Rehabilitation Services 10/01/2022          Revision History

## 2022-10-02 NOTE — Evaluation (Signed)
Occupational Therapy Assessment and Plan  Patient Details  Name: [name] MRN: [id number] Date of Birth: Feb 18, 1976  OT Diagnosis: abnormal posture, cognitive deficits, disturbance of vision, flaccid hemiplegia and hemiparesis, hemiplegia affecting non-dominant side, muscle weakness (generalized), and decreased activity tolerance Rehab Potential: Rehab Potential (ACUTE ONLY): Fair ELOS: 21-28 days   Today's Date: 10/02/2022 OT Individual Time: [id number] OT Individual Time Calculation (min): 67 min     Hospital Problem: Principal Problem:   Acute ischemic right MCA stroke [facility])   Past Medical History:  Past Medical History:  Diagnosis Date   Abnormal Pap smear    Acute combined systolic and diastolic heart failure ([place]) 02/19/2019   Wt Readings from Last 3 Encounters:  09/18/21  84.7 kg  09/02/21  84.4 kg  08/31/21  84.2 kg         Acute heart failure ([place]) 01/29/2022   Acute on chronic combined systolic and diastolic CHF (congestive heart failure) ([place]) 02/19/2019   Acute on chronic congestive heart failure (HCC)    Acute on chronic congestive heart failure (HCC)    Acute on chronic diastolic CHF (congestive heart failure) ([place]) 10/19/2021   Acute on chronic systolic heart failure ([place]) 02/22/2022   Anemia    Asthma    Atrial fibrillation ([place])    history of paroxysmal atrial fibrillation   CHF (congestive heart failure) ([place]) 11/02/2021   CHF exacerbation ([place]) 10/17/2021   Diabetes mellitus    DKA, type 2 ([place]) 02/06/2019   Elevated troponin 09/30/2019   Heart failure with reduced ejection fraction ([place])    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  Consider chronic etiology given continued fertility issues   Hypertension    Hypertensive emergency 09/30/2019   Hypertensive urgency 06/02/2022   Major depressive disorder 02/08/2012   Reports that her infertility is a major cause of her depression Reports  Prozac has worked in the past for her.     Migraines    Morbid obesity ([place]) 02/06/2019   Nausea & vomiting 06/26/2022   Nonischemic cardiomyopathy ([place]) 09/22/2021   NSTEMI (non-ST elevated myocardial infarction) ([place]) 02/23/2022   Obstructive sleep apnea 06/30/2015   Polycystic ovarian disease    Past Surgical History:  Past Surgical History:  Procedure Laterality Date   BIOPSY  11/05/2021   Procedure: BIOPSY;  Surgeon: Daryel November, MD;  Location: [facility];  Service: Gastroenterology;;   CERVIX LESION DESTRUCTION     CESAREAN SECTION     ESOPHAGOGASTRODUODENOSCOPY (EGD) WITH PROPOFOL N/A 11/05/2021   Procedure: ESOPHAGOGASTRODUODENOSCOPY (EGD) WITH PROPOFOL;  Surgeon: Daryel November, MD;  Location: [facility] ENDOSCOPY;  Service: Gastroenterology;  Laterality: N/A;   IR CT HEAD LTD  09/24/2022   IR PERCUTANEOUS ART THROMBECTOMY/INFUSION INTRACRANIAL INC DIAG ANGIO  09/24/2022   RADIOLOGY WITH ANESTHESIA N/A 09/24/2022   Procedure: IR WITH ANESTHESIA;  Surgeon: Radiologist, Medication, MD;  Location: [facility];  Service: Radiology;  Laterality: N/A;   RIGHT/LEFT HEART CATH AND CORONARY ANGIOGRAPHY N/A 03/29/2019   Procedure: RIGHT/LEFT HEART CATH AND CORONARY ANGIOGRAPHY;  Surgeon: Larey Dresser, MD;  Location: [facility] CV LAB;  Service: Cardiovascular;  Laterality: N/A;   RIGHT/LEFT HEART CATH AND CORONARY ANGIOGRAPHY N/A 09/20/2021   Procedure: RIGHT/LEFT HEART CATH AND CORONARY ANGIOGRAPHY;  Surgeon: Larey Dresser, MD;  Location: [facility] CV LAB;  Service: Cardiovascular;  Laterality: N/A;    Assessment & Plan Clinical Impression: Patient is a 47  year old female withhistory fo T2DM, NICM w/chronic systolic/diastolic HF, PAF w/LV thrombus who was presented to ED on 09/22/22 with reports of SOB and CP  X 2 weeks with BLE edema.CTA chest negative for PE and showed anasarca. She was found to have elevated troponins with acute on chronic combined CHF and started on IV Heparin as  well as IV diuresis. Cardiology was consulted and felt that patient with demand ischemia due to CHF as EKG without ST changes.  She continued to have issues with SOB and medication being adjusted by cards with recommendations to transition patient to Eliquis due to noncompliance hx. On afternoon of 03/02, she developed acute onset of right gaze deviation with left hemiplegia. CTA  head/neck done revealing loss of gray white differentiation in insula likely reflecting early infarct, proximal right M2 occlusion with minimal flow throughout the MCA division. She underwent cerebral angio with endovascular revascularization of occluded inferior division of M2 segment of R-MCA with aspiration and without significant change w/TICI 2B revascularization.  She did have briefe cardiac arrest during IR procedure requiring 2 minutes ACLS prior to ROSC. 2D echo done revealing large LV apical thrombus with severe reduction in LVEF with global hypokinesis, EF 15% and moderate TVR. MRI brain done revealing large area of infarct in R-MCA distribution with small area in right ACA distribution and acute punctate infarct in left cerebellar hemisphere. Dr. Erlinda Hong felt that stroke was "likely embolic from LV thrombus with Low EF and PAF even while on heparin". She was kept intubated till mental status improved and self extubated on 03/05. Respiratory status stable, lethargy was slowly resolving, cortak removed and diet advanced to D3, thins. She was transitioned to Apixaban 03/08   Therapy has been working with patient who continues to be limited by left hemiplegia with left inattention and right gaze preference, decreased balance, lacks awareness of deficits, impulsivity with delay in processing and ability to follow one step commands as well as perseveration on going home. Patient transferred to CIR on 10/01/2022 .    Patient currently requires Mod-Max A for BADLs secondary to muscle weakness, decreased cardiorespiratoy endurance, impaired  timing and sequencing, unbalanced muscle activation, and decreased motor planning, decreased visual motor skills, decreased midline orientation and decreased attention to left, decreased memory and delayed processing, and decreased sitting balance, decreased postural control, hemiplegia, and decreased balance strategies.  Prior to hospitalization, patient could complete BADLs/IADLs with independently.   Patient will benefit from skilled intervention to decrease level of assist with basic self-care skills and increase independence with basic self-care skills prior to discharge home with care partner.  Anticipate patient will require minimal physical assistance and follow up home health.  OT - End of Session Activity Tolerance: Tolerates < 10 min activity, no significant change in vital signs Endurance Deficit: Yes OT Assessment Rehab Potential (ACUTE ONLY): Fair OT Barriers to Discharge: Inaccessible home environment;Home environment access/layout;Weight OT Patient demonstrates impairments in the following area(s): Balance;Cognition;Endurance;Motor;Perception;Safety;Sensory;Vision OT Basic ADL's Functional Problem(s): Grooming;Bathing;Dressing;Toileting OT Transfers Functional Problem(s): Toilet;Tub/Shower OT Additional Impairment(s): Fuctional Use of Upper Extremity OT Plan OT Intensity: Minimum of 1-2 x/day, 45 to 90 minutes OT Frequency: 5 out of 7 days OT Duration/Estimated Length of Stay: 21-28 days OT Treatment/Interventions: Balance/vestibular training;Cognitive remediation/compensation;Discharge planning;DME/adaptive equipment instruction;Functional electrical stimulation;Functional mobility training;Neuromuscular re-education;Patient/family education;Psychosocial support;Self Care/advanced ADL retraining;Splinting/orthotics;Therapeutic Activities;Therapeutic Exercise;UE/LE Strength taining/ROM;UE/LE Coordination activities;Visual/perceptual remediation/compensation;Wheelchair  propulsion/positioning OT Basic Self-Care Anticipated Outcome(s): Min A OT Toileting Anticipated Outcome(s): Min A OT Bathroom Transfers Anticipated Outcome(s): Min A OT Recommendation  Recommendations for Other Services: Neuropsych consult;Therapeutic Recreation consult Therapeutic Recreation Interventions: Pet therapy;Stress management Patient destination: Home Follow Up Recommendations: Home health OT Equipment Recommended: To be determined   OT Evaluation Precautions/Restrictions  Precautions Precautions: Fall Precaution Comments: Left hemiparesis Other Brace: L Resting hand splint and L PRAFO Restrictions Weight Bearing Restrictions: No General Chart Reviewed: Yes Family/Caregiver Present: No Vital Signs  Pain Pain Assessment Pain Scale: 0-10 Pain Score: 9  Pain Type: Acute pain Pain Location: Back (Neck) Pain Orientation: Mid Pain Descriptors / Indicators: Aching Pain Intervention(s): Rest Home Living/Prior Functioning Home Living Family/patient expects to be discharged to:: Private residence Living Arrangements: Alone Available Help at Discharge: Family, Friend(s), Available 24 hours/day (Father, children, boyfried.) Type of Home: Apartment Home Access: Stairs to enter Technical brewer of Steps: 1 Home Layout: Two level, 1/2 bath on main level, Bed/bath upstairs Alternate Level Stairs-Number of Steps: Flight Alternate Level Stairs-Rails: Right Bathroom Shower/Tub: Tub/shower unit (Long-handled shower head) Bathroom Toilet: Standard Bathroom Accessibility: Yes Additional Comments: In the process of getting a handicapped accessible apartment, father and daughter's home could also be discharge options.  Lives With: Alone IADL History Homemaking Responsibilities: No Homemaking Comments: Pt states father and aunts have a plan for discharge, in terms of IADLs. Current License: Yes Occupation: Part time employment Type of Occupation: Recruitment consultant Leisure and  Hobbies: Gym, movies. Prior Function Level of Independence: Independent with basic ADLs, Independent with homemaking with ambulation, Independent with gait, Independent with transfers Driving: Yes Vision Baseline Vision/History: 1 Wears glasses Ability to See in Adequate Light: 1 Impaired Patient Visual Report: Other (comment) Vision Assessment?: Vision impaired- to be further tested in functional context;Yes Eye Alignment: Within Functional Limits Ocular Range of Motion: Within Functional Limits;Other (comment) (Slow to move, potential due to fatigue.) Alignment/Gaze Preference: Within Defined Limits Tracking/Visual Pursuits: Able to track stimulus in all quads without difficulty (Slow to track, potentially due to fatigue.) Saccades: Decreased speed of saccadic movement Convergence: Within functional limits Perception  Perception: Impaired Inattention/Neglect: Impaired-to be further tested in functional context Praxis Praxis: Intact Cognition Cognition Overall Cognitive Status: Within Functional Limits for tasks assessed Arousal/Alertness: Lethargic Orientation Level: Place;Person;Situation Memory: Impaired Attention: Selective Awareness: Appears intact Problem Solving: Appears intact Safety/Judgment: Appears intact Brief Interview for Mental Status (BIMS) Repetition of Three Words (First Attempt): 3 Temporal Orientation: Year: Correct Temporal Orientation: Month: Accurate within 5 days Temporal Orientation: Day: Correct Recall: "Sock": Yes, no cue required Recall: "Blue": Yes, no cue required Recall: "Bed": No, could not recall BIMS Summary Score: 13 Sensation Sensation Light Touch: Impaired Detail Peripheral sensation comments: Tingling with movement. Light Touch Impaired Details: Impaired LUE;Impaired LLE Hot/Cold: Appears Intact Proprioception: Impaired Detail Proprioception Impaired Details: Impaired LUE;Impaired LLE Stereognosis: Impaired Detail Stereognosis  Impaired Details: Impaired LUE;Impaired LLE Coordination Gross Motor Movements are Fluid and Coordinated: No Fine Motor Movements are Fluid and Coordinated: No Coordination and Movement Description: Impaired due to L hemiparesis Motor  Motor Motor: Hemiplegia  Trunk/Postural Assessment  Cervical Assessment Cervical Assessment: Exceptions to [facility] Thoracic Assessment Thoracic Assessment: Exceptions to [facility] Lumbar Assessment Lumbar Assessment: Exceptions to [facility] Postural Control Postural Control: Deficits on evaluation Trunk Control: Pt with L-lateral lean when sitting without RUE support.  Balance Balance Balance Assessed: Yes Static Sitting Balance Static Sitting - Balance Support: Right upper extremity supported;Feet supported Static Sitting - Level of Assistance: 5: Stand by assistance (CGA) Dynamic Sitting Balance Dynamic Sitting - Balance Support: Feet supported;During functional activity Dynamic Sitting - Level of Assistance: 3: Mod assist Dynamic  Sitting - Balance Activities: Reaching across midline Extremity/Trunk Assessment RUE Assessment RUE Assessment: Within Functional Limits LUE Assessment LUE Assessment: Exceptions to [facility] LUE Body System: Neuro Brunstrum levels for arm and hand: Arm;Hand Brunstrum level for arm: Stage I Presynergy Brunstrum level for hand: Stage I Flaccidity LUE AROM (degrees) Overall AROM Left Upper Extremity: Deficits LUE Overall AROM Comments: No activiation noted. LUE Strength LUE Overall Strength: Deficits LUE Overall Strength Comments: No activated noted. LUE Tone LUE Tone: Flaccid  Care Tool Care Tool Self Care Eating   Eating Assist Level: Minimal Assistance - Patient > 75%    Oral Care    Oral Care Assist Level: Minimal Assistance - Patient > 75%    Bathing   Body parts bathed by patient: Chest;Abdomen;Front perineal area;Face Body parts bathed by helper: Right arm;Left arm;Buttocks;Right upper leg;Right lower leg;Left upper  leg;Left lower leg   Assist Level: Total Assistance - Patient < 25%    Upper Body Dressing(including orthotics)   What is the patient wearing?: [place] only   Assist Level: Total Assistance - Patient < 25%    Lower Body Dressing (excluding footwear)   What is the patient wearing?: Hospital gown only Assist for lower body dressing: Total Assistance - Patient < 25%    Putting on/Taking off footwear   What is the patient wearing?: Non-skid slipper socks Assist for footwear: Dependent - Patient 0%       Care Tool Toileting Toileting activity   Assist for toileting: Dependent - Patient 0%     Care Tool Bed Mobility Roll left and right activity   Roll left and right assist level: Maximal Assistance - Patient 25 - 49%    Sit to lying activity   Sit to lying assist level: Maximal Assistance - Patient 25 - 49%    Lying to sitting on side of bed activity   Lying to sitting on side of bed assist level: the ability to move from lying on the back to sitting on the side of the bed with no back support.: Moderate Assistance - Patient 50 - 74%     Care Tool Transfers Sit to stand transfer    Defer PT    Chair/bed transfer    Defer to PT     Toilet transfer Toilet transfer activity did not occur: Safety/medical concerns       Care Tool Cognition  Expression of Ideas and Wants Expression of Ideas and Wants: 3. Some difficulty - exhibits some difficulty with expressing needs and ideas (e.g, some words or finishing thoughts) or speech is not clear  Understanding Verbal and Non-Verbal Content Understanding Verbal and Non-Verbal Content: 4. Understands (complex and basic) - clear comprehension without cues or repetitions   Memory/Recall Ability Memory/Recall Ability : Current season;That he or she is in a hospital/hospital unit   Refer to Care Plan for [place] 1 OT Short Term Goal 1 (Week 1): Pt will complete STS transfer with Mod A + LRAD, in  preparation for ADL participation. OT Short Term Goal 2 (Week 1): Pt will complete squat-pivot transfer from EOB<>BSC with Mod A + LRAD. OT Short Term Goal 3 (Week 1): Pt will complete LB ADLs with Max A + LRAD.  Recommendations for other services: Neuropsych and Therapeutic Recreation  Pet therapy and Stress management   Skilled Therapeutic Intervention  Pt received resting in bed for skilled OT session with focus on BADL retraining. Pt overall lethargic, difficult to maintained aroused,  and with flat affect/mood. Pt with multiple sites of pain, neck and lower back, stating ". . . It's this bed." OT offering intermediate rest breaks and positioning suggestions throughout session to address pain/fatigue and maximize participation/safety in session.   Session began with introduction to OT role, OT POC, and general orientation to rehab schedule/unit. Pt completes bed mobility and sponge-bathing with the assist levels noted below. Pt tolerates ~5-8 mins of sitting EOB, before requesting to rest back in bed.   Pt remained resting in bed with all immediate needs met at end of session. Pt continues to be appropriate for skilled OT intervention to promote further functional independence.   ADL ADL Eating: Minimal assistance Where Assessed-Eating: Bed level Grooming: Minimal assistance Where Assessed-Grooming: Bed level Upper Body Bathing: Moderate assistance Where Assessed-Upper Body Bathing: Edge of bed Lower Body Bathing: Maximal assistance Where Assessed-Lower Body Bathing: Bed level Upper Body Dressing: Moderate assistance Where Assessed-Upper Body Dressing: Bed level Lower Body Dressing: Dependent Where Assessed-Lower Body Dressing: Bed level Toileting: Dependent Where Assessed-Toileting: Bed level Toilet Transfer: Unable to assess Social research officer, government: Unable to assess ADL Comments: Pt greatly fatigued on eval, tolerating ~8-10 mins of sitting EOB with Min-Mod A, to bathe UB with  overall Mod A. Mobility  Bed Mobility Bed Mobility: Rolling Right;Rolling Left;Supine to Sit;Sit to Supine Rolling Right: Moderate Assistance - Patient 50-74% Rolling Left: Minimal Assistance - Patient > 75% Supine to Sit: Maximal Assistance - Patient - Patient 25-49% Sit to Supine: Maximal Assistance - Patient 25-49%   Discharge Criteria: Patient will be discharged from OT if patient refuses treatment 3 consecutive times without medical reason, if treatment goals not met, if there is a change in medical status, if patient makes no progress towards goals or if patient is discharged from hospital.  The above assessment, treatment plan, treatment alternatives and goals were discussed and mutually agreed upon: by patient  Maudie Mercury, OTR/L, MSOT  10/02/2022, 11:23 AM

## 2022-10-03 ENCOUNTER — Other Ambulatory Visit (HOSPITAL_COMMUNITY): Payer: Self-pay

## 2022-10-03 ENCOUNTER — Other Ambulatory Visit: Payer: Self-pay

## 2022-10-03 LAB — CBC WITH DIFFERENTIAL/PLATELET
Abs Immature Granulocytes: 0.03 10*3/uL (ref 0.00–0.07)
Basophils Absolute: 0 10*3/uL (ref 0.0–0.1)
Basophils Relative: 1 %
Eosinophils Absolute: 0.1 10*3/uL (ref 0.0–0.5)
Eosinophils Relative: 1 %
HCT: 38.8 % (ref 36.0–46.0)
Hemoglobin: 12.1 g/dL (ref 12.0–15.0)
Immature Granulocytes: 0 %
Lymphocytes Relative: 29 %
Lymphs Abs: 2.2 10*3/uL (ref 0.7–4.0)
MCH: 22.1 pg — ABNORMAL LOW (ref 26.0–34.0)
MCHC: 31.2 g/dL (ref 30.0–36.0)
MCV: 70.8 fL — ABNORMAL LOW (ref 80.0–100.0)
Monocytes Absolute: 0.8 10*3/uL (ref 0.1–1.0)
Monocytes Relative: 10 %
Neutro Abs: 4.6 10*3/uL (ref 1.7–7.7)
Neutrophils Relative %: 59 %
Platelets: 295 10*3/uL (ref 150–400)
RBC: 5.48 MIL/uL — ABNORMAL HIGH (ref 3.87–5.11)
RDW: 22.9 % — ABNORMAL HIGH (ref 11.5–15.5)
Smear Review: NORMAL
WBC: 7.7 10*3/uL (ref 4.0–10.5)
nRBC: 0 % (ref 0.0–0.2)

## 2022-10-03 LAB — COMPREHENSIVE METABOLIC PANEL
ALT: 15 U/L (ref 0–44)
AST: 40 U/L (ref 15–41)
Albumin: 1.9 g/dL — ABNORMAL LOW (ref 3.5–5.0)
Alkaline Phosphatase: 289 U/L — ABNORMAL HIGH (ref 38–126)
Anion gap: 12 (ref 5–15)
BUN: 21 mg/dL — ABNORMAL HIGH (ref 6–20)
CO2: 30 mmol/L (ref 22–32)
Calcium: 8.9 mg/dL (ref 8.9–10.3)
Chloride: 94 mmol/L — ABNORMAL LOW (ref 98–111)
Creatinine, Ser: 1.34 mg/dL — ABNORMAL HIGH (ref 0.44–1.00)
GFR, Estimated: 50 mL/min — ABNORMAL LOW (ref >60)
Glucose, Bld: 109 mg/dL — ABNORMAL HIGH (ref 70–99)
Potassium: 3.9 mmol/L (ref 3.5–5.1)
Sodium: 136 mmol/L (ref 135–145)
Total Bilirubin: 1.7 mg/dL — ABNORMAL HIGH (ref 0.3–1.2)
Total Protein: 6.6 g/dL (ref 6.5–8.1)

## 2022-10-03 LAB — GLUCOSE, CAPILLARY
Glucose-Capillary: 107 mg/dL — ABNORMAL HIGH (ref 70–99)
Glucose-Capillary: 132 mg/dL — ABNORMAL HIGH (ref 70–99)
Glucose-Capillary: 205 mg/dL — ABNORMAL HIGH (ref 70–99)
Glucose-Capillary: 177 mg/dL — ABNORMAL HIGH (ref 70–99)

## 2022-10-03 NOTE — Progress Notes (Signed)
Inpatient Rehabilitation Care Coordinator Assessment and Plan Patient Details  Name: [name] MRN: [id number] Date of Birth: [birth year]  Today's Date: 10/03/2022  Hospital Problems: Principal Problem:   Acute ischemic right MCA stroke [facility]) Active Problems:   Insomnia  Past Medical History:  Past Medical History:  Diagnosis Date   Abnormal Pap smear    Acute combined systolic and diastolic heart failure ([place]) 02/19/2019   Wt Readings from Last 3 Encounters:  09/18/21  84.7 kg  09/02/21  84.4 kg  08/31/21  84.2 kg         Acute heart failure ([place]) 01/29/2022   Acute on chronic combined systolic and diastolic CHF (congestive heart failure) ([place]) 02/19/2019   Acute on chronic congestive heart failure (HCC)    Acute on chronic congestive heart failure (HCC)    Acute on chronic diastolic CHF (congestive heart failure) ([place]) 10/19/2021   Acute on chronic systolic heart failure ([place]) 02/22/2022   Anemia    Asthma    Atrial fibrillation (HCC)    history of paroxysmal atrial fibrillation   CHF (congestive heart failure) ([place]) 11/02/2021   CHF exacerbation ([place]) 10/17/2021   Diabetes mellitus    DKA, type 2 ([place]) 02/06/2019   Elevated troponin 09/30/2019   Heart failure with reduced ejection fraction ([place])    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     Migraines    Morbid obesity ([place]) 02/06/2019   Nausea & vomiting 06/26/2022   Nonischemic cardiomyopathy ([place]) 09/22/2021   NSTEMI (non-ST elevated myocardial infarction) ([place]) 02/23/2022   Obstructive sleep apnea 06/30/2015   Polycystic ovarian disease    Past  Surgical History:  Past Surgical History:  Procedure Laterality Date   BIOPSY  11/05/2021   Procedure: BIOPSY;  Surgeon: Daryel November, MD;  Location: [place];  Service: Gastroenterology;;   CERVIX LESION DESTRUCTION     CESAREAN SECTION     ESOPHAGOGASTRODUODENOSCOPY (EGD) WITH PROPOFOL N/A 11/05/2021   Procedure: ESOPHAGOGASTRODUODENOSCOPY (EGD) WITH PROPOFOL;  Surgeon: Daryel November, MD;  Location: [facility] ENDOSCOPY;  Service: Gastroenterology;  Laterality: N/A;   IR CT HEAD LTD  09/24/2022   IR PERCUTANEOUS ART THROMBECTOMY/INFUSION INTRACRANIAL INC DIAG ANGIO  09/24/2022   RADIOLOGY WITH ANESTHESIA N/A 09/24/2022   Procedure: IR WITH ANESTHESIA;  Surgeon: Radiologist, Medication, MD;  Location: [place];  Service: Radiology;  Laterality: N/A;   RIGHT/LEFT HEART CATH AND CORONARY ANGIOGRAPHY N/A 03/29/2019   Procedure: RIGHT/LEFT HEART CATH AND CORONARY ANGIOGRAPHY;  Surgeon: Larey Dresser, MD;  Location: [place] CV LAB;  Service: Cardiovascular;  Laterality: N/A;   RIGHT/LEFT HEART CATH AND CORONARY ANGIOGRAPHY N/A 09/20/2021   Procedure: RIGHT/LEFT HEART CATH AND CORONARY ANGIOGRAPHY;  Surgeon: Larey Dresser, MD;  Location: [place] CV LAB;  Service: Cardiovascular;  Laterality: N/A;   Social History:  reports that she has never smoked. She has never used smokeless tobacco. She reports that she does not drink alcohol and does not use drugs.  Family / Support Systems Marital Status: Single Patient Roles: Parent Children: kennita-daughter [phone]  Taniya-daughter [phone] Other Supports: Kenny-father [phone]   lindsey-friend  [phone] Anticipated  Caregiver: Two daughter's, father and Kennita's partner-aware pt will need 24/7 care at DC Ability/Limitations of Caregiver: Family works and have samll children but will work on a Physicist, medical Availability: 24/7 Family Dynamics: Close knit with family who do for each other. Pt feels they will pull together to do what she  needs done and she is hopeful she will do well here.  Social History Preferred language: English Religion: Baptist Cultural Background: No issues Education: HS Health Literacy - How often do you need to have someone help you when you read instructions, pamphlets, or other written material from your doctor or pharmacy?: Never Writes: Yes Employment Status: Disabled Public relations account executive Issues: No issues Guardian/Conservator: None-according to MD pt is capable of making her own decisions while here   Abuse/Neglect Abuse/Neglect Assessment Can Be Completed: Yes Physical Abuse: Denies Verbal Abuse: Denies Sexual Abuse: Denies Exploitation of patient/patient's resources: Denies Self-Neglect: Denies  Patient response to: Social Isolation - How often do you feel lonely or isolated from those around you?: Never  Emotional Status Pt's affect, behavior and adjustment status: Pt is motivated to do well and recover from her stroke, she has always been able to take care of herself even with her other health issues. Her family comes daily to provide support and encouragement Recent Psychosocial Issues: other health issues-CHF, DM, HTN Psychiatric History: Hisotry of depression not currently taking any medications for this. Will place on neuro-psych list to be seen while here due to history and now having had a CVA Substance Abuse History: No issues  Patient / Family Perceptions, Expectations & Goals Pt/Family understanding of illness & functional limitations: Pt and dad can explain her stroke and deficits, both have spoken with the MD and feel they understand her treatment plan moving forward. Pt is hopeful she will do well and recover from this stroke. Premorbid pt/family roles/activities: mom, daughter, granddaughter, freind, etc Anticipated changes in roles/activities/participation: resume Pt/family expectations/goals: Pt states: " I hope to be able to do as much as I can for myself  before I leave here."  Dad states: " We will do our best to make sure she has what she needs."  [employer]: None Premorbid Home Care/DME Agencies: None Transportation available at discharge: family pt did not drive PTA Is the patient able to respond to transportation needs?: Yes In the past 12 months, has lack of transportation kept you from medical appointments or from getting medications?: No In the past 12 months, has lack of transportation kept you from meetings, work, or from getting things needed for daily living?: No Resource referrals recommended: Neuropsychology  Discharge Planning Living Arrangements: Alone Support Systems: Children, Parent, Other relatives, Friends/neighbors Type of Residence: Private residence Insurance Resources: Information systems manager, [employer] (specify county) Museum/gallery curator Resources: [employer] Financial Screen Referred: No Living Expenses: Education officer, community Management: Patient Does the patient have any problems obtaining your medications?: No Home Management: self Patient/Family Preliminary Plans: Plan to go home with daughter or Dad, depends upon the amount of care she requires and which place is more accessible. her apartment is not accessible many steps to the bedrooms. Family to work together on the care she will need at discharge. Care Coordinator Anticipated Follow Up Needs: HH/OP  Clinical Impression Pleasant female who is willing to work hard to try to recover from her stroke and regain her independence back. Her family is involved and supportive and aware she will require 24/7 care at discharge. Will work with all on plans and pt is on  neuro-psych list to be seen while here.  Elease Hashimoto 10/03/2022, 12:35 PM

## 2022-10-03 NOTE — Progress Notes (Signed)
[facility] Individual Statement of Services  Patient Name:  [name]  Date:  10/03/2022  Welcome to the [facility].  Our goal is to provide you with an individualized program based on your diagnosis and situation, designed to meet your specific needs.  With this comprehensive rehabilitation program, you will be expected to participate in at least 3 hours of rehabilitation therapies Monday-Friday, with modified therapy programming on the weekends.  Your rehabilitation program will include the following services:  Physical Therapy (PT), Occupational Therapy (OT), Speech Therapy (ST), 24 hour per day rehabilitation nursing, Therapeutic Recreaction (TR), Neuropsychology, Care Coordinator, Rehabilitation Medicine, Nutrition Services, and Pharmacy Services  Weekly team conferences will be held on Wednesday to discuss your progress.  Your Inpatient Rehabilitation Care Coordinator will talk with you frequently to get your input and to update you on team discussions.  Team conferences with you and your family in attendance may also be held.  Expected length of stay: 3-4 weeks  Overall anticipated outcome: CGA-min assist level  Depending on your progress and recovery, your program may change. Your Inpatient Rehabilitation Care Coordinator will coordinate services and will keep you informed of any changes. Your Inpatient Rehabilitation Care Coordinator's name and contact numbers are listed  below.  The following services may also be recommended but are not provided by the [facility]:   [place] will be made to provide these services after discharge if needed.  Arrangements include referral to agencies that provide these services.  Your insurance has been verified to be:  Medicare & medicaid Your primary doctor is:  Donney Dice  Pertinent information will  be shared with your doctor and your insurance company.  Inpatient Rehabilitation Care Coordinator:  Ovidio Kin, [place] or Emilia Beck  Information discussed with and copy given to patient by: Elease Hashimoto, 10/03/2022, 12:37 PM

## 2022-10-03 NOTE — Progress Notes (Signed)
Physical Therapy Session Note  Patient Details  Name: [name] MRN: [id number] Date of Birth: 1976/03/28  {CHL IP REHAB PT TIME CALCULATION:[id number]}  Short Term Goals: Week 1:  PT Short Term Goal 1 (Week 1): Pt will roll side to side w/ min A. PT Short Term Goal 2 (Week 1): Pt will transfer sup to sit w/ mod A PT Short Term Goal 3 (Week 1): Pt will maintain sitting balance x 5' w/ min A PT Short Term Goal 4 (Week 1): PT to assess gait.  Skilled Therapeutic Interventions/Progress Updates:      Therapy Documentation Precautions:  Precautions Precautions: Fall Precaution Comments: Left hemiparesis Required Braces or Orthoses: Other Brace Other Brace: L Resting hand splint and L PRAFO Restrictions Weight Bearing Restrictions: No    Therapy/Group: Individual Therapy  Verner Mould, DPT Acute Rehabilitation Services Office [phone]  10/03/22 4:16 PM

## 2022-10-03 NOTE — Progress Notes (Signed)
Patient ID: [name], female   DOB: [birth year], 47 y.o.   MRN: [id number] Met with the patient and father and family via phone to review current situation, rehab process, therapy schedule and plan of care. Reviewed ELOS and discharge dispo; 2 ste; will need ramp installation for discharge. Reviewed secondary risks including HF, BNP on admit [id number]; daily weights and ZONE tool. Patient noted she had noted a weight gain PTA, was given extra fluid pills and sent home only to have a CVA afterwards. Reviewed other risks; HTN, DM (A1 C 10.8);  and Af-b with OSA and hx of migraines. Reviewed medications Eliquis twice a day and HF meds along with insulin administration with CMM/HH dietary modification recommendations. Patient does not have a glucometer.  Continue to follow along to address educational needs to facilitate preparation for discharge. Margarito Liner

## 2022-10-03 NOTE — Progress Notes (Signed)
Occupational Therapy Session Note  Patient Details  Name: [name] MRN: [id number] Date of Birth: 05/02/1976  Today's Date: 10/03/2022 OT Individual Time: [id number] OT Individual Time Calculation (min): 62 min    Short Term Goals: Week 1:  OT Short Term Goal 1 (Week 1): Pt will complete STS transfer with Mod A + LRAD, in preparation for ADL participation. OT Short Term Goal 2 (Week 1): Pt will complete squat-pivot transfer from EOB<>BSC with Mod A + LRAD. OT Short Term Goal 3 (Week 1): Pt will complete LB ADLs with Max A + LRAD.  Skilled Therapeutic Interventions/Progress Updates:  Pt greeted upright in bed with NT assisting pt with breakfast, pt agreeable to OT intervention. Assisted pt with remainder of self feeding with pt able to use RUE to self feed but noted to pocket on L side with intermittent coughing with thin liquid intake form straw, verified with staff that pt on SLP caseload for further evaluation.   Pt completed supine>sit with MAX A +2, pt unable to progress LLE towards EOB without assist. Once EOB pt was able to maintain static sitting balance with close CGA. Pt with mild L lean but able to correct with cues, pt with L inattention but was able to scan to L side when prompted.    Pt completed squat pivot transfer to w/c to R side with MAX A+2. Pt with poor initiation and impaired body awareness needing MAX cues for hand placement and body mechanics during transfer. Donned clothes from w/c with pt needing MAX A for UB dressing, no active movement in LUE and total A to don pants with pt able to stand at sink with MAX A +2 while tech pulled pants to waist line.   Education provided to pt and pts father on L inattention and working on visual scanning to L to speak to pts dad as well as supporting LUE while in w/c.   Ended session with pt seated in w/c with alarm belt activated and all needs within reach.                  Of note noted swelling in LLE when doffing PRAFO,  alerted nurse to potentially reach out to MD about teds.        Therapy Documentation Precautions:  Precautions Precautions: Fall Precaution Comments: Left hemiparesis Required Braces or Orthoses: Other Brace Other Brace: L Resting hand splint and L PRAFO Restrictions Weight Bearing Restrictions: No   Pain: unrated pain reported in neck and back, pt reports this is baseline pain but provided rest breaks as needed.     Therapy/Group: Individual Therapy  Corinne Ports [facility] 10/03/2022, 12:08 PM

## 2022-10-03 NOTE — Evaluation (Signed)
Speech Language Pathology Assessment and Plan  Patient Details  Name: [name] MRN: [id number] Date of Birth: 03-09-1976  SLP Diagnosis: Dysphagia;Cognitive Impairments;Dysarthria  Rehab Potential: Good ELOS: 3-4 weeks    Today's Date: 10/03/2022 SLP Individual Time: 1300-1400 SLP Individual Time Calculation (min): 60 min   Hospital Problem: Principal Problem:   Acute ischemic right MCA stroke [facility]) Active Problems:   Insomnia  Past Medical History:  Past Medical History:  Diagnosis Date   Abnormal Pap smear    Acute combined systolic and diastolic heart failure ([place]) 02/19/2019   Wt Readings from Last 3 Encounters:  09/18/21  84.7 kg  09/02/21  84.4 kg  08/31/21  84.2 kg         Acute heart failure ([place]) 01/29/2022   Acute on chronic combined systolic and diastolic CHF (congestive heart failure) ([place]) 02/19/2019   Acute on chronic congestive heart failure (HCC)    Acute on chronic congestive heart failure (HCC)    Acute on chronic diastolic CHF (congestive heart failure) ([place]) 10/19/2021   Acute on chronic systolic heart failure ([place]) 02/22/2022   Anemia    Asthma    Atrial fibrillation (HCC)    history of paroxysmal atrial fibrillation   CHF (congestive heart failure) ([place]) 11/02/2021   CHF exacerbation ([place]) 10/17/2021   Diabetes mellitus    DKA, type 2 ([place]) 02/06/2019   Elevated troponin 09/30/2019   Heart failure with reduced ejection fraction ([place])    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     Migraines    Morbid obesity ([place]) 02/06/2019   Nausea & vomiting 06/26/2022   Nonischemic  cardiomyopathy ([place]) 09/22/2021   NSTEMI (non-ST elevated myocardial infarction) ([place]) 02/23/2022   Obstructive sleep apnea 06/30/2015   Polycystic ovarian disease    Past Surgical History:  Past Surgical History:  Procedure Laterality Date   BIOPSY  11/05/2021   Procedure: BIOPSY;  Surgeon: Daryel November, MD;  Location: [place];  Service: Gastroenterology;;   CERVIX LESION DESTRUCTION     CESAREAN SECTION     ESOPHAGOGASTRODUODENOSCOPY (EGD) WITH PROPOFOL N/A 11/05/2021   Procedure: ESOPHAGOGASTRODUODENOSCOPY (EGD) WITH PROPOFOL;  Surgeon: Daryel November, MD;  Location[facility] ENDOSCOPY;  Service: Gastroenterology;  Laterality: N/A;   IR CT HEAD LTD  09/24/2022   IR PERCUTANEOUS ART THROMBECTOMY/INFUSION INTRACRANIAL INC DIAG ANGIO  09/24/2022   RADIOLOGY WITH ANESTHESIA N/A 09/24/2022   Procedure: IR WITH ANESTHESIA;  Surgeon: Radiologist, Medication, MD;  Location: [place];  Service: Radiology;  Laterality: N/A;   RIGHT/LEFT HEART CATH AND CORONARY ANGIOGRAPHY N/A 03/29/2019   Procedure: RIGHT/LEFT HEART CATH AND CORONARY ANGIOGRAPHY;  Surgeon: Larey Dresser, MD;  Location: [place] CV LAB;  Service: Cardiovascular;  Laterality: N/A;   RIGHT/LEFT HEART CATH AND CORONARY ANGIOGRAPHY N/A 09/20/2021   Procedure: RIGHT/LEFT HEART CATH AND CORONARY ANGIOGRAPHY;  Surgeon: Larey Dresser, MD;  Location: [place] [facility] LAB;  Service: Cardiovascular;  Laterality: N/A;    Assessment / Plan / Recommendation   Clinical Impression  Pt is a 47 y/o female who presents with a moderate cognitive linguistic impairment and mild to moderate  oropharyngeal dysphagia. Pt has a PMHx which includes but is not limited to: CHF, anemia, DM, and HTN. Pt was previously living at home alone where she completed medication and financial management indep. Additionally, pt consumed a regular diet and thin liquids without impairment. She presented on 2/29 for SOB related to acute on chronic CHF. MRI brain 3/3:  Large area of acute infarct in the right MCA distribution, smaller area of acute infarct in the right ACA distribution, and  punctate acute infarct in the left cerebellar hemisphere. Persistent occlusion of a right M2 branch with minimal collateral flow in the MCA distribution aside from in the region of the frontal operculum. IR thrombectomy attempted 3/2, but was unsuccessful due to cardiac arrest during procedure with <1 minute of chest compressions.Pt remained intubated post-procedure. ETT 3/2-3/5 (self-extubation). Acute SLP recommended Dys 3 and thin liquids as safest least restrictive diet.   The [employer] Mental Status (SLUMS) Exam is a cognitive screen utilized to assess the presence of dementia and other cognitive deficits. Pt scored a 14/ 30 indicative of impairment. Deficits were noted in orientation to time, immediate recall, mental manipulation, and executive function skills.   Bedside Swallow Evaluation completed with thin liquid, puree, and mech soft solids. Pt was alert and seated upright in WC. Consecutive single sips of liquids via straw with notable strong cough after the swallow and anterior spillage to left side. No s/ sx pen/asp observed with sips of thin liquid via cup edge. She consumed puree solids via spoon with anterior spillage to left side. In response to mech soft solids pt noted with mild bolus holding, and decreased bolus formation, mild lingual residue and pocketing in L buccal cavity. Pt able to clear with verbal cues. SLP recommending  Dys 3 with thin liquids and no straw.  Informal dysarthria assessment completed which revealed mild dysarthria c/b decreased speech intelligibility at the sentence and conversational level. Pt's speech appeared monotone with decreased vocal intensity which is different from her baseline per family report.    SLP recommending skilled intervention to address dysphagia management, dysarthria, and cognitive re- training via  structured and functional tasks, designing and implementing compensatory strategies, therapeutic diet trials, diet texture analysis/ modifications, and pt/ caregiver education in order to maximize functional independence, decrease caregiver burden, and to maximize functional intake. Anticipate need for 24/7 supervision and assistance, as well as f/u ST intervention at next venue of care.  .   Skilled Therapeutic Interventions          CSE, informal assessment measures, and SLUMS administered. Please see full report for additional details.    SLP Assessment  Patient will need skilled Speech Lanaguage Pathology Services during CIR admission    Recommendations  SLP Diet Recommendations: Dysphagia 3 (Mech soft);Thin Liquid Administration via: Cup;No straw Medication Administration: Crushed with puree Supervision: Full supervision/cueing for compensatory strategies Compensations: Minimize environmental distractions;Slow rate;Small sips/bites;Monitor for anterior loss;Lingual sweep for clearance of pocketing Postural Changes and/or Swallow Maneuvers: Out of bed for meals;Seated upright 90 degrees;Upright 30-60 min after meal Oral Care Recommendations: Oral care BID Patient destination: Home Follow up Recommendations: Home Health SLP;Outpatient SLP Equipment Recommended: None recommended by SLP    SLP Frequency 3 to 5 out of 7 days   SLP Duration  SLP Intensity  SLP Treatment/Interventions 3-4 weeks  Minumum of 1-2 x/day, 30 to 90 minutes  Cognitive remediation/compensation;Internal/external aids;Multimodal communication approach;Patient/family education;Medication managment;Dysphagia/aspiration precaution training    Pain No pain  Prior Functioning Cognitive/Linguistic Baseline: Within functional  limits Type of Home: Apartment  Lives With: Alone Available Help at Discharge: Family;Friend(s)  SLP Evaluation Cognition Overall Cognitive Status: Impaired/Different from  baseline Arousal/Alertness: Awake/alert Orientation Level: Oriented to person;Oriented to place;Oriented to situation;Disoriented to time Year: Other (Comment) (64) Month: March Day of Week: Correct Attention: Sustained;Selective;Alternating Focused Attention: Appears intact Sustained Attention: Impaired Sustained Attention Impairment: Verbal basic;Functional basic Selective Attention: Impaired Selective Attention Impairment: Verbal basic;Functional basic Alternating Attention: Impaired Alternating Attention Impairment: Verbal basic;Functional basic Memory: Impaired Memory Impairment: Retrieval deficit;Decreased recall of new information;Decreased short term memory Decreased Short Term Memory: Verbal basic;Functional basic Awareness: Impaired Awareness Impairment: Intellectual impairment;Emergent impairment Problem Solving: Impaired Problem Solving Impairment: Verbal basic;Functional basic Executive Function: Reasoning;Self Correcting;Organizing Reasoning: Impaired Reasoning Impairment: Functional basic;Verbal basic Organizing: Impaired Organizing Impairment: Verbal basic;Functional basic Self Correcting: Impaired Self Correcting Impairment: Verbal basic;Functional basic Safety/Judgment: Appears intact  Comprehension Auditory Comprehension Overall Auditory Comprehension: Impaired Yes/No Questions: Within Functional Limits Commands: Within Functional Limits Conversation: Complex Interfering Components: Attention;Processing speed;Working Field seismologist: Conservation officer, nature: Exceptions to [employer] Reading Comprehension Reading Status: Not tested Expression Expression Primary Mode of Expression: Verbal Verbal Expression Overall Verbal Expression: Appears within functional limits for tasks assessed Initiation: No impairment Automatic Speech: Name;Social Response;Month of year Level of  Generative/Spontaneous Verbalization: Conversation Repetition: No impairment Naming: No impairment Pragmatics: No impairment Interfering Components: Attention Written Expression Dominant Hand: Right Written Expression: Not tested Oral Motor Oral Motor/Sensory Function Lingual ROM: Reduced right;Reduced left;Suspected CN XII (hypoglossal) dysfunction Lingual Strength: Reduced;Suspected CN XII (hypoglossal) dysfunction Motor Speech Respiration: Within functional limits Level of Impairment: Sentence Phonation: Low vocal intensity Resonance: Within functional limits Articulation: Within functional limitis Level of Impairment: Conversation Intelligibility: Intelligibility reduced Word: 75-100% accurate Phrase: 75-100% accurate Sentence: 75-100% accurate Conversation: 75-100% accurate Motor Planning: Witnin functional limits Motor Speech Errors: Unaware Effective Techniques: Increased vocal intensity  Care Tool Care Tool Cognition Ability to hear (with hearing aid or hearing appliances if normally used Ability to hear (with hearing aid or hearing appliances if normally used): 0. Adequate - no difficulty in normal conservation, social interaction, listening to TV   Expression of Ideas and Wants Expression of Ideas and Wants: 3. Some difficulty - exhibits some difficulty with expressing needs and ideas (e.g, some words or finishing thoughts) or speech is not clear   Understanding Verbal and Non-Verbal Content Understanding Verbal and Non-Verbal Content: 3. Usually understands - understands most conversations, but misses some part/intent of message. Requires cues at times to understand  Memory/Recall Ability Memory/Recall Ability : Current season;That he or she is in a hospital/hospital unit    Bedside Swallowing Assessment General Date of Onset: 10/01/22 Previous Swallow Assessment: BSE while pt was in acute care with recommendations for Dys 3 and thin liquids Diet Prior to this  Study: Dysphagia 3 (mechanical soft);Thin liquids (Level 0) Temperature Spikes Noted: No Respiratory Status: Room air History of Recent Intubation: Yes Total duration of intubation (days): 3 days Date extubated: 09/27/22 Behavior/Cognition: Alert;Cooperative;Distractible Oral Cavity - Dentition: Adequate natural dentition Self-Feeding Abilities: Needs assist;Needs set up Patient Positioning: Upright in chair/Tumbleform Baseline Vocal Quality: Normal Volitional Cough: Strong Volitional Swallow: Able to elicit  Oral Care Assessment Oral Assessment  (WDL): Exceptions to WDL Lips: Asymmetrical Teeth: Missing (Comment) Tongue: Pink;Moist Mucous Membrane(s): Moist;Pink Saliva: Moist, saliva free flowing Level of Consciousness: Alert Is patient on any of following O2 devices?: None of the above Nutritional status: Dysphagia Oral Assessment Risk : High Risk Ice Chips Ice chips: Not tested Thin Liquid Thin  Liquid: Impaired Presentation: Cup;Straw Oral Phase Functional Implications: Left anterior spillage Pharyngeal  Phase Impairments: Cough - Immediate;Cough - Delayed Nectar Thick Nectar Thick Liquid: Not tested Honey Thick Honey Thick Liquid: Not tested Puree Puree: Within functional limits Presentation: Spoon Oral Phase Impairments: Reduced lingual movement/coordination Oral Phase Functional Implications: Left anterior spillage Solid Solid: Within functional limits Presentation: Spoon Oral Phase Impairments: Impaired mastication BSE Assessment Risk for Aspiration Impact on safety and function: Moderate aspiration risk Other Related Risk Factors: Prolonged intubation;Cognitive impairment  Short Term Goals: SLP Short Term Goal 1 (Week 1): Pt will improve orientation to person, place, time and situation given mod verbal and visual cues with 100% acc SLP Short Term Goal 2 (Week 1): Pt will clear dys 3 solids from lateral sulcus given min verbal and visual cues with 80%  acc SLP Short Term Goal 3 (Week 1): Pt will recall novel, functional information post 3-5 minute delay with 80% acc with mod verbal and visual cues. SLP Short Term Goal 4 (Week 1): Pt will consume thin liquids without s/sx pen/asp given min A in 90% opportunities SLP Short Term Goal 5 (Week 1): Pt will identify solutions to problems within least restrictive environment with 90%acc with min verbal and visual cues.    Refer to Care Plan for Long Term Goals  Recommendations for other services: None   Discharge Criteria: Patient will be discharged from SLP if patient refuses treatment 3 consecutive times without medical reason, if treatment goals not met, if there is a change in medical status, if patient makes no progress towards goals or if patient is discharged from hospital.  The above assessment, treatment plan, treatment alternatives and goals were discussed and mutually agreed upon: by patient and by family  Colin Benton 10/03/2022, 3:59 PM

## 2022-10-03 NOTE — Discharge Instructions (Addendum)
Inpatient Rehab Discharge Instructions  [name] Discharge date and time:  11/01/22  Activities/Precautions/ Functional Status: Activity: no lifting, driving, or strenuous exercise till cleared by MD. Diet: cardiac diet and diabetic diet Wound Care: none needed   Functional status:  ___ No restrictions      ___ Walk up steps independently _X__ 24/7 supervision/assistance   ___ Walk up steps with assistance ___ Intermittent supervision/assistance  ___ Bathe/dress independently ___ Walk with walker     _X__ Bathe/dress with assistance ___ Walk Independently     ___ Shower independently _X__ Walk with assistance    ___ Shower with assistance __X_ No alcohol      ___ Return to work/school ________   Special Instructions: Monitor blood sugars before meals and/or at bedtime Family needs to manage/administer medications.    COMMUNITY REFERRALS UPON DISCHARGE:    Home Health:   PT   OT   SP   RN                 Agency:[facility]     Phone:[phone]    Medical Equipment/Items Ordered: [facility]  [phone] [facility] [phone]                                                    STROKE/TIA DISCHARGE INSTRUCTIONS SMOKING Cigarette smoking nearly doubles your risk of having a stroke & is the single most alterable risk factor  If you smoke or have smoked in the last 12 months, you are advised to quit smoking for your health. Most of the excess cardiovascular risk related to smoking disappears within a year of stopping. Ask you doctor about anti-smoking medications [place] Quit Line: 1-800-QUIT NOW Free Smoking Cessation Classes [phone]  CHOLESTEROL Know your levels; limit fat & cholesterol in your diet  Lipid Panel     Component Value Date/Time   CHOL 136 09/24/2022 0130   CHOL 266 (H) 10/20/2017 1054   TRIG 63 09/28/2022 0456   HDL 27 (L) 09/24/2022 0130   HDL 43 10/20/2017 1054   CHOLHDL  5.0 09/24/2022 0130   VLDL 12 09/24/2022 0130   LDLCALC 97 09/24/2022 0130   LDLCALC 195 (H) 10/20/2017 1054     Many patients benefit from treatment even if their cholesterol is at goal. Goal: Total Cholesterol (CHOL) less than 160 Goal:  Triglycerides (TRIG) less than 150 Goal:  HDL greater than 40 Goal:  LDL (LDLCALC) less than 100   BLOOD PRESSURE American Stroke Association blood pressure target is less that 120/80 mm/Hg  Your discharge blood pressure is:  BP: 125/69 Monitor your blood pressure Limit your salt and alcohol intake Many individuals will require more than one medication for high blood pressure  DIABETES (A1c is a blood sugar average for last 3 months) Goal HGBA1c is under 7% (HBGA1c is blood sugar average for last 3 months)  Diabetes: Keep up the good work (A1c is coming down from 15.5 11/23)     Lab Results  Component Value Date   HGBA1C 10.8 (H) 09/24/2022    Your HGBA1c can be lowered with medications, healthy diet, and exercise. Check your blood sugar as directed by your physician Call your physician if you experience unexplained or low blood sugars.  PHYSICAL ACTIVITY/REHABILITATION Goal is 30 minutes at  least 4 days per week  Activity: No driving, Therapies: see above Return to work: N/A Activity decreases your risk of heart attack and stroke and makes your heart stronger.  It helps control your weight and blood pressure; helps you relax and can improve your mood. Participate in a regular exercise program. Talk with your doctor about the best form of exercise for you (dancing, walking, swimming, cycling).  DIET/WEIGHT Goal is to maintain a healthy weight  Your discharge diet is:  Diet Order             DIET DYS 3 Room service appropriate? Yes; Fluid consistency: Thin  Diet effective now                   liquids Your height is:  Height: 4\' 11"  (149.9 cm) Your current weight is: Weight: 60.8 kg Your Body Mass Index (BMI) is:  BMI (Calculated):  28.93 Following the type of diet specifically designed for you will help prevent another stroke. Your goal weight is:  123.5 lbs Your goal Body Mass Index (BMI) is 19-24. Healthy food habits can help reduce 3 risk factors for stroke:  High cholesterol, hypertension, and excess weight.  RESOURCES Stroke/Support Group:  Call [phone]   STROKE EDUCATION PROVIDED/REVIEWED AND GIVEN TO PATIENT Stroke warning signs and symptoms How to activate emergency medical system (call 911). Medications prescribed at discharge. Need for follow-up after discharge. Personal risk factors for stroke. Pneumonia vaccine given:  Flu vaccine given:  My questions have been answered, the writing is legible, and I understand these instructions.  I will adhere to these goals & educational materials that have been provided to me after my discharge from the hospital.     My questions have been answered and I understand these instructions. I will adhere to these goals and the provided educational materials after my discharge from the hospital.  Patient/Caregiver Signature _______________________________ Date __________  Clinician Signature _______________________________________ Date __________  Please bring this form and your medication list with you to all your follow-up doctor's appointments.  Information on my medicine - ELIQUIS (apixaban)  This medication education was reviewed with me or my healthcare representative as part of my discharge preparation.   Why was Eliquis prescribed for you? Eliquis was prescribed to treat blood clots and to reduce the risk of them occurring again.  What do You need to know about Eliquis ? The dose is 5 mg tablet taken TWICE daily.  Eliquis may be taken with or without food.   Try to take the dose about the same time in the morning and in the evening. If you have difficulty swallowing the tablet whole please discuss with your pharmacist how to take the medication  safely.  Take Eliquis exactly as prescribed and DO NOT stop taking Eliquis without talking to the doctor who prescribed the medication.  Stopping may increase your risk of developing a new blood clot.  Refill your prescription before you run out.  After discharge, you should have regular check-up appointments with your healthcare provider that is prescribing your Eliquis.    What do you do if you miss a dose? If a dose of ELIQUIS is not taken at the scheduled time, take it as soon as possible on the same day and twice-daily administration should be resumed. The dose should not be doubled to make up for a missed dose.  Important Safety Information A possible side effect of Eliquis is bleeding. You should call your healthcare provider right away  if you experience any of the following: Bleeding from an injury or your nose that does not stop. Unusual colored urine (red or dark brown) or unusual colored stools (red or black). Unusual bruising for unknown reasons. A serious fall or if you hit your head (even if there is no bleeding).  Some medicines may interact with Eliquis and might increase your risk of bleeding or clotting while on Eliquis. To help avoid this, consult your healthcare provider or pharmacist prior to using any new prescription or non-prescription medications, including herbals, vitamins, non-steroidal anti-inflammatory drugs (NSAIDs) and supplements.  This website has more information on Eliquis (apixaban): http://www.eliquis.com/eliquis/home

## 2022-10-03 NOTE — Progress Notes (Signed)
Inpatient Rehabilitation  Patient information reviewed and entered into eRehab system by Anntonette Madewell Aloni Chuang, OTR/L, Rehab Quality Coordinator.   Information including medical coding, functional ability and quality indicators will be reviewed and updated through discharge.   

## 2022-10-03 NOTE — Plan of Care (Signed)
  Problem: RH Swallowing Goal: LTG Patient will consume least restrictive diet using compensatory strategies with assistance (SLP) Description: LTG:  Patient will consume least restrictive diet using compensatory strategies with assistance (SLP) Outcome: Progressing   Problem: RH Cognition - SLP Goal: RH LTG Patient will demonstrate orientation with cues Description:  LTG:  Patient will demonstrate orientation to person/place/time/situation with cues (SLP)   Outcome: Progressing   Problem: RH Problem Solving Goal: LTG Patient will demonstrate problem solving for (SLP) Description: LTG:  Patient will demonstrate problem solving for basic/complex daily situations with cues  (SLP) Outcome: Progressing   Problem: RH Memory Goal: LTG Patient will use memory compensatory aids to (SLP) Description: LTG:  Patient will use memory compensatory aids to recall biographical/new, daily complex information with cues (SLP) Outcome: Progressing

## 2022-10-03 NOTE — Progress Notes (Signed)
PROGRESS NOTE   Subjective/Complaints: No new complaints this morning Transferring with nursing to commode and she is appreciative of nursing care Labs reviewed and stable except for rising creatinine.  ROS: denies pain   Objective:   No results found. Recent Labs    10/01/22 0406 10/03/22 0550  WBC 6.8 7.7  HGB 11.8* 12.1  HCT 38.1 38.8  PLT 267 295   Recent Labs    10/03/22 0550  NA 136  K 3.9  CL 94*  CO2 30  GLUCOSE 109*  BUN 21*  CREATININE 1.34*  CALCIUM 8.9    Intake/Output Summary (Last 24 hours) at 10/03/2022 1229 Last data filed at 10/03/2022 0600 Gross per 24 hour  Intake 360 ml  Output 850 ml  Net -490 ml        Physical Exam: Vital Signs Blood pressure (!) 118/97, pulse 76, temperature 98.5 F (36.9 C), temperature source Oral, resp. rate 16, height '4\' 11"'$  (1.499 m), weight 85.2 kg, last menstrual period 06/22/2022, SpO2 99 %. Constitutional:      General: She is not in acute distress.    Appearance: She is obese. BMI 37.94    Comments: Kept eyes turned to the right but able to turn to midline and left with minimal cues.   HENT:     Head: Normocephalic.     Mouth/Throat:     Mouth: Mucous membranes are moist.     Comments: Tongue with white coating Eyes:     Conjunctiva/sclera: Conjunctivae normal.     Pupils: Pupils are equal, round, and reactive to light.  Cardiovascular:     Rate and Rhythm: Normal rate and regular rhythm.  Pulmonary:     Effort: Pulmonary effort is normal. No respiratory distress.     Breath sounds: Normal breath sounds. No wheezing.  Abdominal:     General: Bowel sounds are normal.     Palpations: Abdomen is soft.  Musculoskeletal:        General: No swelling or tenderness.  Skin:    General: Skin is warm and dry.  Neurological:     Mental Status: She is alert.     Comments: Pt appears lethargic but awakens and responds to questions. Speech dysarthric,  low volume. Language appears to be intact. Right gaze preference but can follow to left to a lesser extent. No obvious field cut. Left central 7 and tongue deviation. LUE and LLE 0/5. Senses pain and LT in all 4's. No resting tone. DTR's 1+ throughout. Toes down left foot, no clonus.   Psychiatric:     Comments: Flat, depressed    Assessment/Plan: 1. Functional deficits which require 3+ hours per day of interdisciplinary therapy in a comprehensive inpatient rehab setting. Physiatrist is providing close team supervision and 24 hour management of active medical problems listed below. Physiatrist and rehab team continue to assess barriers to discharge/monitor patient progress toward functional and medical goals  Care Tool:  Bathing    Body parts bathed by patient: Chest, Abdomen, Front perineal area, Face   Body parts bathed by helper: Right arm, Left arm, Buttocks, Right upper leg, Right lower leg, Left upper leg, Left lower leg  Bathing assist Assist Level: Total Assistance - Patient < 25%     Upper Body Dressing/Undressing Upper body dressing   What is the patient wearing?: Pull over shirt    Upper body assist Assist Level: Maximal Assistance - Patient 25 - 49%    Lower Body Dressing/Undressing Lower body dressing      What is the patient wearing?: Pants     Lower body assist Assist for lower body dressing: Total Assistance - Patient < 25%     Toileting Toileting    Toileting assist Assist for toileting: Dependent - Patient 0%     Transfers Chair/bed transfer  Transfers assist  Chair/bed transfer activity did not occur: Safety/medical concerns  Chair/bed transfer assist level: 2 Helpers (MAX A +2 squat pivot to R side)     Locomotion Ambulation   Ambulation assist   Ambulation activity did not occur: Safety/medical concerns          Walk 10 feet activity   Assist  Walk 10 feet activity did not occur: Safety/medical concerns        Walk 50  feet activity   Assist Walk 50 feet with 2 turns activity did not occur: Safety/medical concerns         Walk 150 feet activity   Assist Walk 150 feet activity did not occur: Safety/medical concerns         Walk 10 feet on uneven surface  activity   Assist Walk 10 feet on uneven surfaces activity did not occur: Safety/medical concerns         Wheelchair     Assist Is the patient using a wheelchair?: No   Wheelchair activity did not occur: Safety/medical concerns         Wheelchair 50 feet with 2 turns activity    Assist    Wheelchair 50 feet with 2 turns activity did not occur: Safety/medical concerns       Wheelchair 150 feet activity     Assist  Wheelchair 150 feet activity did not occur: Safety/medical concerns       Blood pressure (!) 118/97, pulse 76, temperature 98.5 F (36.9 C), temperature source Oral, resp. rate 16, height '4\' 11"'$  (1.499 m), weight 85.2 kg, last menstrual period 06/22/2022, SpO2 99 %.    Medical Problem List and Plan: 1. 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  Antithrombotics: -DVT/anticoagulation:  Pharmaceutical: Eliquis             -antiplatelet therapy: N/A 3.Back pain/Pain Management:  Tylenol prn. Oxycodone making her nausea worse and ineffective--?? 4. Mood/Behavior/Sleep: LCSW to follow for evaluation and support.              -antipsychotic agents: N/A             -pt reports chronic anxiety at baseline. Expresses that she's depressed as well currently.              -took atarax at home tid prn. Will resume.             -begin lexapro '5mg'$  qhs             -  neuropsych eval 5. Neuropsych/cognition: This patient is not capable of making decisions on her own behalf. 6. Skin/Wound Care:  Routine pressure relief   measures.  7. Fluids/Electrolytes/Nutrition: Monitor I/O. Check CMET on 03/11.  8. Acute on chronic combined CHF: HH diet.  -Monitor weights daily and for signs of overload.             --continue Demadex, Cozaar, Spironolactone, Jardiance and Lopressor  Weight reviewed 3/11 and is down to 85.2 kg 9. T2DM: Hgb A1C-10.8  (improving >15 @ 11/23). Was on Lantus 15u w/ novolog 12 units with supper             --monitor BS ac/hs and use SSI for elevated BS             --on SSI   -magnesium level reviewed and is normal.  10. Acute chronic renal failure: Encourage fluid intake. 3.11 creatinine reviewed and increased to 1.34, placed nursing order for 6-8 glasses of water per day and ordered repeat BMP for tomorrow.  11. Severe obesity: Educate patient on diet, compliance and exercise to help promote health and mobility.   >50 minutes spent in review of patient's chart, her prior problems, her labs, physical examination of patient, placed nursing order for 6-8 glasses of water per day, ordered repeat BMP for tomorrow LOS: 2 days A FACE TO FACE EVALUATION WAS PERFORMED  Clide Deutscher Goerge Mohr 10/03/2022, 12:29 PM

## 2022-10-04 ENCOUNTER — Telehealth: Payer: Self-pay | Admitting: Family Medicine

## 2022-10-04 ENCOUNTER — Telehealth: Payer: Self-pay | Admitting: Physical Medicine and Rehabilitation

## 2022-10-04 DIAGNOSIS — G8194 Hemiplegia, unspecified affecting left nondominant side: Secondary | ICD-10-CM | POA: Insufficient documentation

## 2022-10-04 DIAGNOSIS — H518 Other specified disorders of binocular movement: Secondary | ICD-10-CM | POA: Insufficient documentation

## 2022-10-04 DIAGNOSIS — F330 Major depressive disorder, recurrent, mild: Secondary | ICD-10-CM

## 2022-10-04 LAB — BASIC METABOLIC PANEL
Anion gap: 12 (ref 5–15)
BUN: 19 mg/dL (ref 6–20)
CO2: 31 mmol/L (ref 22–32)
Calcium: 8.9 mg/dL (ref 8.9–10.3)
Chloride: 94 mmol/L — ABNORMAL LOW (ref 98–111)
Creatinine, Ser: 1.4 mg/dL — ABNORMAL HIGH (ref 0.44–1.00)
GFR, Estimated: 48 mL/min — ABNORMAL LOW (ref >60)
Glucose, Bld: 127 mg/dL — ABNORMAL HIGH (ref 70–99)
Potassium: 4.1 mmol/L (ref 3.5–5.1)
Sodium: 137 mmol/L (ref 135–145)

## 2022-10-04 LAB — GLUCOSE, CAPILLARY
Glucose-Capillary: 137 mg/dL — ABNORMAL HIGH (ref 70–99)
Glucose-Capillary: 172 mg/dL — ABNORMAL HIGH (ref 70–99)
Glucose-Capillary: 153 mg/dL — ABNORMAL HIGH (ref 70–99)
Glucose-Capillary: 169 mg/dL — ABNORMAL HIGH (ref 70–99)

## 2022-10-04 LAB — VITAMIN D 25 HYDROXY (VIT D DEFICIENCY, FRACTURES): Vit D, 25-Hydroxy: 21.41 ng/mL — ABNORMAL LOW (ref 30–100)

## 2022-10-04 MED ORDER — TROLAMINE SALICYLATE 10 % EX CREA: TOPICAL_CREAM | Freq: Three times a day (TID) | CUTANEOUS | Status: DC

## 2022-10-04 MED ORDER — MUSCLE RUB 10-15 % EX CREA
TOPICAL_CREAM | Freq: Three times a day (TID) | CUTANEOUS | Status: DC
  Filled 2022-10-04: qty 85

## 2022-10-04 MED ORDER — EMPAGLIFLOZIN 25 MG PO TABS
25.0000 mg | ORAL_TABLET | Freq: Every day | ORAL | Status: DC
  Administered 2022-10-05 – 2022-10-31 (×27): 25 mg via ORAL
  Filled 2022-10-04 (×28): qty 1

## 2022-10-04 MED ORDER — TORSEMIDE 20 MG PO TABS
40.0000 mg | ORAL_TABLET | Freq: Every day | ORAL | Status: DC
  Administered 2022-10-06 – 2022-10-07 (×2): 40 mg via ORAL
  Filled 2022-10-04 (×2): qty 2

## 2022-10-04 MED ORDER — SODIUM CHLORIDE 0.9 % IV SOLN: Freq: Every day | INTRAVENOUS | Status: DC

## 2022-10-04 NOTE — Progress Notes (Signed)
Patient ID: [name], female   DOB: 1975/11/01, 47 y.o.   MRN: [id number]  Ramp information given to pt, Dad and daughter who were present in her room.

## 2022-10-04 NOTE — Progress Notes (Signed)
PROGRESS NOTE   Subjective/Complaints: Does have neck and back pain after therapy- kpad ordered Tolerated therapy well today, Mas X with rolling  ROS: denies pain at rest, but has back and neck pain after therapy   Objective:   No results found. Recent Labs    10/03/22 0550  WBC 7.7  HGB 12.1  HCT 38.8  PLT 295   Recent Labs    10/03/22 0550 10/04/22 0529  NA 136 137  K 3.9 4.1  CL 94* 94*  CO2 30 31  GLUCOSE 109* 127*  BUN 21* 19  CREATININE 1.34* 1.40*  CALCIUM 8.9 8.9    Intake/Output Summary (Last 24 hours) at 10/04/2022 1101 Last data filed at 10/03/2022 1415 Gross per 24 hour  Intake 240 ml  Output --  Net 240 ml        Physical Exam: Vital Signs Blood pressure (!) 119/91, pulse 68, temperature 97.7 F (36.5 C), temperature source Oral, resp. rate 19, height '4\' 11"'$  (1.499 m), weight 84.6 kg, last menstrual period 06/22/2022, SpO2 100 %. Constitutional:      General: She is not in acute distress.    Appearance: She is obese. BMI 37.94    Comments: Kept eyes turned to the right but able to turn to midline and left with minimal cues.   HENT:     Head: Normocephalic.     Mouth/Throat:     Mouth: Mucous membranes are moist.     Comments: Tongue with white coating Eyes:     Conjunctiva/sclera: Conjunctivae normal.     Pupils: Pupils are equal, round, and reactive to light.  Cardiovascular:     Rate and Rhythm: Normal rate and regular rhythm.  Pulmonary:     Effort: Pulmonary effort is normal. No respiratory distress.     Breath sounds: Normal breath sounds. No wheezing.  Abdominal:     General: Bowel sounds are normal.     Palpations: Abdomen is soft.  Musculoskeletal:        General: No swelling or tenderness.  Skin:    General: Skin is warm and dry.  Neurological:     Mental Status: She is alert.     Comments: Pt appears lethargic but awakens and responds to questions. Speech  dysarthric, low volume. Language appears to be intact. Right gaze preference but can follow to left to a lesser extent. No obvious field cut. Left central 7 and tongue deviation. LUE and LLE 0/5. Senses pain and LT in all 4's. No resting tone. DTR's 1+ throughout. Toes down left foot, no clonus.  Max A rolling Psychiatric:     Comments: Flat, depressed    Assessment/Plan: 1. Functional deficits which require 3+ hours per day of interdisciplinary therapy in a comprehensive inpatient rehab setting. Physiatrist is providing close team supervision and 24 hour management of active medical problems listed below. Physiatrist and rehab team continue to assess barriers to discharge/monitor patient progress toward functional and medical goals  Care Tool:  Bathing    Body parts bathed by patient: Chest, Abdomen, Front perineal area, Face   Body parts bathed by helper: Right arm, Left arm, Buttocks, Right upper leg, Right lower leg,  Left upper leg, Left lower leg     Bathing assist Assist Level: Total Assistance - Patient < 25%     Upper Body Dressing/Undressing Upper body dressing   What is the patient wearing?: Pull over shirt    Upper body assist Assist Level: Maximal Assistance - Patient 25 - 49%    Lower Body Dressing/Undressing Lower body dressing      What is the patient wearing?: Pants     Lower body assist Assist for lower body dressing: Total Assistance - Patient < 25%     Toileting Toileting    Toileting assist Assist for toileting: Dependent - Patient 0%     Transfers Chair/bed transfer  Transfers assist  Chair/bed transfer activity did not occur: Safety/medical concerns  Chair/bed transfer assist level: 2 Helpers (MAXX +2 squat pivot)     Locomotion Ambulation   Ambulation assist   Ambulation activity did not occur: Safety/medical concerns          Walk 10 feet activity   Assist  Walk 10 feet activity did not occur: Safety/medical concerns         Walk 50 feet activity   Assist Walk 50 feet with 2 turns activity did not occur: Safety/medical concerns         Walk 150 feet activity   Assist Walk 150 feet activity did not occur: Safety/medical concerns         Walk 10 feet on uneven surface  activity   Assist Walk 10 feet on uneven surfaces activity did not occur: Safety/medical concerns         Wheelchair     Assist Is the patient using a wheelchair?: Yes Type of Wheelchair: Manual Wheelchair activity did not occur: Safety/medical concerns         Wheelchair 50 feet with 2 turns activity    Assist    Wheelchair 50 feet with 2 turns activity did not occur: Safety/medical concerns       Wheelchair 150 feet activity     Assist  Wheelchair 150 feet activity did not occur: Safety/medical concerns       Blood pressure (!) 119/91, pulse 68, temperature 97.7 F (36.5 C), temperature source Oral, resp. rate 19, height '4\' 11"'$  (1.499 m), weight 84.6 kg, last menstrual period 06/22/2022, SpO2 100 %.    Medical Problem List and Plan: 1. 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  Antithrombotics: -DVT/anticoagulation:  Pharmaceutical: Eliquis             -antiplatelet therapy: N/A 3.Back pain/Pain Management:  Tylenol prn. Oxycodone making her nausea worse and ineffective--?? 4. Mood/Behavior/Sleep: LCSW to follow for evaluation and support.              -antipsychotic agents: N/A             -pt reports chronic anxiety at baseline. Expresses that she's depressed as well currently.              -took atarax at home tid prn. Will resume.             -begin lexapro '5mg'$  qhs             -  neuropsych eval 5. Neuropsych/cognition: This patient is not capable of making decisions on her own behalf. 6. Skin/Wound  Care:  Routine pressure relief  measures.  7. Fluids/Electrolytes/Nutrition: Monitor I/O. Check CMET on 03/11.  8. Acute on chronic combined CHF: HH diet.  -Monitor weights daily and for signs of overload.             --continue Demadex, Cozaar, Spironolactone, Jardiance and Lopressor  Weight reviewed 3/11 and is down to 85.2 kg 9. T2DM: Hgb A1C-10.8  (improving >15 @ 11/23). Was on Lantus 15u w/ novolog 12 units with supper             --monitor BS ac/hs and use SSI for elevated BS             --on SSI   -magnesium level reviewed and is normal.   -increase Jardiance to '25mg'$  10. HTN: potassium and magnesium reviewed and normal. Continue Spironolactone.  11. Severe obesity: Educate patient on diet, compliance and exercise to help promote health and mobility. Magnesium reviewed and normal. Add on vitamin D level today 12. AKI: Normal saline ordered overnight.    LOS: 3 days A FACE TO FACE EVALUATION WAS PERFORMED  Alek Borges P Tashawnda Bleiler 10/04/2022, 11:01 AM

## 2022-10-04 NOTE — Consult Note (Signed)
Neuropsychological Consultation [facility]   Patient:   [name]   DOB:   Sep 26, 1975  MR Number:  [id number]  Location:  [facility] [address] [facility] [address] [place] [id number] [place] [address] Dept: [place]: [phone]           Date of Service:   10/04/2022  Start Time:   2 PM End Time:   3 PM  Provider/Observer:  Ilean Skill, Psy.D.       Clinical Neuropsychologist       Billing Code/Service: [phone]  Reason for Service:    [name] is a 47 year old female referred for neuropsychological consultation due to coping and adjustment issues with recent cerebrovascular accident in conjunction with congestive heart failure and current admission onto the inpatient comprehensive rehabilitation unit.  The patient has a past history of type 2 diabetes, and CIM with chronic systolic/systolic heart failure, PAF.  Patient also has a past history of recurrent major depressive disorder and last record I could find in her EMR pertaining to this had to do with her visits with her primary care physician Dr. Criss Rosales with a diagnosis of major depressive disorder and attempts to restart her on antidepressant.  At that time the attempts were with BuSpar as anxiety was also one of her symptoms but it does not appear that the patient took this medication for any long-term reason.  I did not readily find indications of other SSRI medications previously.  Patient had presented to the emergency department on 09/22/2022 with reports of shortness of breath and CP for the prior 2 weeks and bilateral lower extremity edema.  Patient was found to have elevated troponins with acute on chronic combined CHF and treated.  On the afternoon of 09/24/2019 patient developed acute onset of right gaze deviation with left hemiaplasia.  Neuroimaging revealed loss of gray-white differentiation in the insula likely reflecting early  infarct, proximal right M2 occlusion with minimal flow throughout the MCA division.  Patient underwent cerebral angio with endovascular revascularization of occluded M2 segment of the right MCA.  Patient did have brief cardiac arrest during interventional radiology procedures requiring 2 minutes of ACLS prior to ROSC.  Follow-up with MRI revealed large area of infarct in the right MCA division with small area of right ACA distribution and acute punctuate infarct in the left cerebellar hemisphere.  Neurology felt stroke was likely embolic in nature.  Patient with significant mental status changes initially but these have improved.  Patient continues to be limited by left hemiplegia with left inattention and right gaze preference, decreased balance, lack of awareness of deficits, impulsivity with delays in information processing speed and difficulty following even one-step commands with perseverative thinking.  During the clinical interview today, the patient demonstrated ongoing perseverative thinking and focus on certain aspects of information that she was retaining but limited retention of information.  Patient wanted to know if she had "really died for 2 minutes" as she had been told of her ACLS intervention.  The patient was aware that something happened but was unable to give any specific details although she was able to give fragments of information.  Patient was drowsy and had intermittent somnolence during the hour spent with her.  Patient showed limited grasp of information but was able to retain some information at least for short periods of time during our conversation.  Patient did tend to perseverate.  Patient did not really appear to comprehend the  issues related to inattention and neglect features.  Patient was aware of her left motor deficits.  Patient's daughter and father were both present in the room and had multiple questions about her status and what to expect.  Patient has been making noted  improvements over her time in the hospital since her acute worsening on 3/2.  Specific and more in-depth questions was made regarding features of depression which have been noted by attending physicians previously.  Patient's family and patient both report that there were pre-existing issues with depression that also played a role in her less than diligent compliance with medication and medical efforts.  While patient was unable to give specific medications in the past medical records going back into 2021 suggest previous attempts to treat her depression with the patient requesting intervention for her depression.  There was a brief period of time where the patient took BuSpar and the patient is now taking Lexapro which was started by Dr. Naaman Plummer while on the unit.  Patient is also taking trazodone at night for sleep.  Patient reports that she feels like her mood has improved somewhat although the patient is a poor historian and currently has significant cognitive limitations.  HPI for the current admission:    HPI:  [name] is a 47 year old female withhistory fo T2DM, NICM w/chronic systolic/diastolic HF, PAF w/LV thrombus who was presented to ED on 09/22/22 with reports of SOB and CP  X 2 weeks with BLE edema.CTA chest negative for PE and showed anasarca. She was found to have elevated troponins with acute on chronic combined CHF and started on IV Heparin as well as IV diuresis. Cardiology was consulted and felt that patient with demand ischemia due to CHF as EKG without ST changes.  She continued to have issues with SOB and medication being adjusted by cards with recommendations to transition patient to Eliquis due to noncompliance hx. On afternoon of 03/02, she developed acute onset of right gaze deviation with left hemiplegia. CTA  head/neck done revealing loss of gray white differentiation in insula likely reflecting early infarct, proximal right M2 occlusion with minimal flow throughout the MCA  division. She underwent cerebral angio with endovascular revascularization of occluded inferior division of M2 segment of R-MCA with aspiration and without significant change w/TICI 2B revascularization.  She did have briefe cardiac arrest during IR procedure requiring 2 minutes ACLS prior to ROSC.    2D echo done revealing large LV apical thrombus with severe reduction in LVEF with global hypokinesis, EF 15% and moderate TVR. MRI brain done revealing large area of infarct in R-MCA distribution with small area in right ACA distribution and acute punctate infarct in left cerebellar hemisphere. Dr. Erlinda Hong felt that stroke was "likely embolic from LV thrombus with Low EF and PAF even while on heparin". She was kept intubated till mental status improved and self extubated on 03/05. Respiratory status stable, lethargy was slowly resolving, cortak removed and diet advanced to D3, thins. She was transitioned to Apixaban 03/08   Therapy has been working with patient who continues to be limited by left hemiplegia with left inattention and right gaze preference, decreased balance, lacks awareness of deficits, impulsivity with delay in processing and ability to follow one step commands as well as perseveration on going home. CIR recommended due to functional decline.   Medical History:   Past Medical History:  Diagnosis Date   Abnormal Pap smear    Acute combined systolic and diastolic heart failure ([place])  02/19/2019   Wt Readings from Last 3 Encounters:  09/18/21  84.7 kg  09/02/21  84.4 kg  08/31/21  84.2 kg         Acute heart failure ([place]) 01/29/2022   Acute on chronic combined systolic and diastolic CHF (congestive heart failure) ([place]) 02/19/2019   Acute on chronic congestive heart failure ([facility])    Acute on chronic congestive heart failure ([facility])    Acute on chronic diastolic CHF (congestive heart failure) ([place]) 10/19/2021   Acute on chronic systolic heart failure ([place]) 02/22/2022   Anemia    Asthma    Atrial  fibrillation ([place])    history of paroxysmal atrial fibrillation   CHF (congestive heart failure) ([place]) 11/02/2021   CHF exacerbation ([place]) 10/17/2021   Diabetes mellitus    DKA, type 2 ([place]) 02/06/2019   Elevated troponin 09/30/2019   Heart failure with reduced ejection fraction ([place])    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     Migraines    Morbid obesity ([place]) 02/06/2019   Nausea & vomiting 06/26/2022   Nonischemic cardiomyopathy ([place]) 09/22/2021   NSTEMI (non-ST elevated myocardial infarction) ([place]) 02/23/2022   Obstructive sleep apnea 06/30/2015   Polycystic ovarian disease          Patient Active Problem List   Diagnosis Date Noted   Gaze preference w/left inattention 10/04/2022   Left hemiplegia ([place]) 10/04/2022   Mild episode of recurrent major depressive disorder ([place]) 10/04/2022   Insomnia 10/02/2022   Acute ischemic right MCA stroke ([place]) 10/01/2022   Acute respiratory failure with hypoxia ([place]) 09/27/2022   Cerebrovascular accident (CVA) due to embolism of right middle cerebral artery ([place]) 09/27/2022   Middle cerebral artery embolism, right 09/24/2022   Acute on chronic combined systolic (congestive) and diastolic (congestive) heart failure ([place]) 09/23/2022   Anasarca 09/23/2022   CHF exacerbation ([place]) 08/05/2022   Nausea and vomiting 06/25/2022   Mild protein malnutrition ([place]) 06/25/2022   Class 2 obesity 06/25/2022   Hyperbilirubinemia 06/25/2022   Chronic pain 05/09/2022   Dizziness 11/02/2021   Depression, recurrent ([place])    Nonischemic cardiomyopathy ([place]) 09/22/2021   Nonobstructive  atherosclerosis of coronary artery 09/22/2021   LV (left ventricular) mural thrombus 09/22/2021   Hypokalemia 10/13/2019   Elevated troponin 09/30/2019   Chronic chest wall pain 03/27/2019   Hyperglycemia    Biventricular failure ([place]) 02/06/2019   Severe obesity (BMI >= 40) (H[facility]) 02/06/2019   Chronic combined systolic and diastolic heart failure ([facility]) 12/31/2018   Paroxysmal atrial fibrillation ([place]) 12/31/2018   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/31/2018   Chronic nausea 12/27/2018   Hyperlipidemia associated with type 2 diabetes mellitus ([place]) 10/30/2017   Obstructive sleep apnea 06/30/2015   Shortness of breath 06/29/2015   Anemia of chronic disease 07/22/2013   Allergic rhinitis 06/27/2012   Hypertension associated with diabetes ([place]) 12/04/2011   Poorly controlled type 2 diabetes mellitus ([place]) 05/19/2011   PCOS (polycystic ovarian syndrome) 05/19/2011    Behavioral Observation/Mental Status:   [name]  presents as a 47 y.o.-year-old Right handed African American Female who appeared her stated  age. her dress was Appropriate and she was Well Groomed and her manners were Appropriate to the situation.  her participation was indicative of Inattentive behaviors.  There were physical disabilities noted.  she displayed an appropriate level of cooperation and motivation.    Interactions:    Minimal Drowsy, Inattentive, and Redirectable  Attention:   abnormal and attention span appeared shorter than expected for age  Memory:   abnormal; global memory impairment noted  Visuo-spatial:   abnormal visual inattention  Speech (Volume):  low  Speech:   slurred;   Thought Process:  Circumstantial and Tangential  Concrete, Disorganized, Linear, and Preoccupied  Though Content:  Rumination; not suicidal and not homicidal  Orientation:   person and place  Judgment:   Poor  Planning:   Poor  Affect:    Depressed and  Flat  Mood:    Dysphoric  Insight:   Shallow  Intelligence:   low  Psychiatric History:  Patient does have a past history of previous diagnosis of major depression with recurrent of mild and moderate severity.  Family Med/Psych History:  Family History  Problem Relation Age of Onset   Diabetes Mother    Hypertension Mother    Hypertension Father    Diabetes Father     Impression/DX:   [name] is a 47 year old female referred for neuropsychological consultation due to coping and adjustment issues with recent cerebrovascular accident in conjunction with congestive heart failure and current admission onto the inpatient comprehensive rehabilitation unit.  The patient has a past history of type 2 diabetes, and CIM with chronic systolic/systolic heart failure, PAF.  Patient also has a past history of recurrent major depressive disorder and last record I could find in her EMR pertaining to this had to do with her visits with her primary care physician Dr. Criss Rosales with a diagnosis of major depressive disorder and attempts to restart her on antidepressant.  At that time the attempts were with BuSpar as anxiety was also one of her symptoms but it does not appear that the patient took this medication for any long-term reason.  I did not readily find indications of other SSRI medications previously.  Patient had presented to the emergency department on 09/22/2022 with reports of shortness of breath and CP for the prior 2 weeks and bilateral lower extremity edema.  Patient was found to have elevated troponins with acute on chronic combined CHF and treated.  On the afternoon of 09/24/2019 patient developed acute onset of right gaze deviation with left hemiaplasia.  Neuroimaging revealed loss of gray-white differentiation in the insula likely reflecting early infarct, proximal right M2 occlusion with minimal flow throughout the MCA division.  Patient underwent cerebral angio with endovascular  revascularization of occluded M2 segment of the right MCA.  Patient did have brief cardiac arrest during interventional radiology procedures requiring 2 minutes of ACLS prior to ROSC.  Follow-up with MRI revealed large area of infarct in the right MCA division with small area of right ACA distribution and acute punctuate infarct in the left cerebellar hemisphere.  Neurology felt stroke was likely embolic in nature.  Patient with significant mental status changes initially but these have improved.  Patient continues to be limited by left hemiplegia with left inattention and right gaze preference, decreased balance, lack of awareness of deficits, impulsivity with delays in information processing speed and difficulty following even one-step commands with perseverative thinking.  During the clinical interview today, the patient demonstrated ongoing perseverative thinking and focus on certain  aspects of information that she was retaining but limited retention of information.  Patient wanted to know if she had "really died for 2 minutes" as she had been told of her ACLS intervention.  The patient was aware that something happened but was unable to give any specific details although she was able to give fragments of information.  Patient was drowsy and had intermittent somnolence during the hour spent with her.  Patient showed limited grasp of information but was able to retain some information at least for short periods of time during our conversation.  Patient did tend to perseverate.  Patient did not really appear to comprehend the issues related to inattention and neglect features.  Patient was aware of her left motor deficits.  Patient's daughter and father were both present in the room and had multiple questions about her status and what to expect.  Patient has been making noted improvements over her time in the hospital since her acute worsening on 3/2.  Specific and more in-depth questions was made regarding  features of depression which have been noted by attending physicians previously.  Patient's family and patient both report that there were pre-existing issues with depression that also played a role in her less than diligent compliance with medication and medical efforts.  While patient was unable to give specific medications in the past medical records going back into 2021 suggest previous attempts to treat her depression with the patient requesting intervention for her depression.  There was a brief period of time where the patient took BuSpar and the patient is now taking Lexapro which was started by Dr. Naaman Plummer while on the unit.  Patient is also taking trazodone at night for sleep.  Patient reports that she feels like her mood has improved somewhat although the patient is a poor historian and currently has significant cognitive limitations.  Disposition/Plan:  Today we worked on coping and adjustment issues and answered specific questions directed by the patient as well as her daughter and father who are present.  Diagnosis:    Recurrent major depressive disorder recurrent mild symptoms without psychotic features  Patient currently started on Lexapro.         Electronically Signed   _______________________ Ilean Skill, Psy.D. Clinical Neuropsychologist

## 2022-10-04 NOTE — Discharge Summary (Signed)
Physician Discharge Summary  Patient ID: [name] MRN: [id number] DOB/AGE: Oct 05, 1975 47 y.o.  Admit date: 10/01/2022 Discharge date: 11/01/2022  Discharge Diagnoses:  Principal Problem:   Acute ischemic right MCA stroke Active Problems:   Poorly controlled type 2 diabetes mellitus   Hyperlipidemia associated with type 2 diabetes mellitus   Chronic combined systolic and diastolic heart failure   Paroxysmal atrial fibrillation   Nonischemic cardiomyopathy   LV (left ventricular) mural thrombus   Chronic pain   Insomnia   Gaze preference w/left inattention   Left hemiplegia   Mild episode of recurrent major depressive disorder   Discharged Condition: stable  Significant Diagnostic Studies: VAS [place] LOWER EXTREMITY VENOUS (DVT)  Result Date: 10/08/2022  Lower Venous DVT Study Patient Name:  [name]  Date of Exam:   10/08/2022 Medical Rec #: [id number]        Accession #:    [id number] Date of Birth: 21-Dec-1975         Patient Gender: F Patient Age:   36 years Exam Location:  [facility] Procedure:      VAS [place] LOWER EXTREMITY VENOUS (DVT) Referring Phys: Sula Soda --------------------------------------------------------------------------------  Indications: Pain, and Swelling.  Anticoagulation: Eliquis. Comparison Study: No prior study. Performing Technologist: McKayla Maag RVT, VT  Examination Guidelines: A complete evaluation includes B-mode imaging, spectral Doppler, color Doppler, and power Doppler as needed of all accessible portions of each vessel. Bilateral testing is considered an integral part of a complete examination. Limited examinations for reoccurring indications may be performed as noted. The reflux portion of the exam is performed with the patient in reverse Trendelenburg.  +---------+---------------+---------+-----------+----------+-------------------+ RIGHT    CompressibilityPhasicitySpontaneityPropertiesThrombus Aging       +---------+---------------+---------+-----------+----------+-------------------+ CFV      Full           Yes      Yes                                      +---------+---------------+---------+-----------+----------+-------------------+ SFJ      Full                                                             +---------+---------------+---------+-----------+----------+-------------------+ FV Prox  Full                                                             +---------+---------------+---------+-----------+----------+-------------------+ FV Mid   Full                                                             +---------+---------------+---------+-----------+----------+-------------------+ FV DistalFull                                                             +---------+---------------+---------+-----------+----------+-------------------+  PFV      Full                                                             +---------+---------------+---------+-----------+----------+-------------------+ POP      Full           Yes      Yes                                      +---------+---------------+---------+-----------+----------+-------------------+ PTV                     Yes      Yes                  Not well visualized +---------+---------------+---------+-----------+----------+-------------------+ PERO                    Yes      Yes                  Not well visualized +---------+---------------+---------+-----------+----------+-------------------+   +---------+---------------+---------+-----------+----------+-------------------+ LEFT     CompressibilityPhasicitySpontaneityPropertiesThrombus Aging      +---------+---------------+---------+-----------+----------+-------------------+ CFV      Full           Yes      Yes                                      +---------+---------------+---------+-----------+----------+-------------------+  SFJ      Full                                                             +---------+---------------+---------+-----------+----------+-------------------+ FV Prox  Full                                                             +---------+---------------+---------+-----------+----------+-------------------+ FV Mid   Full                                                             +---------+---------------+---------+-----------+----------+-------------------+ FV DistalFull                                                             +---------+---------------+---------+-----------+----------+-------------------+ PFV      Full                                                             +---------+---------------+---------+-----------+----------+-------------------+  POP      Full           Yes      Yes                                      +---------+---------------+---------+-----------+----------+-------------------+ PTV                     Yes      Yes                  Not well visualized +---------+---------------+---------+-----------+----------+-------------------+ PERO                    Yes      Yes                  Not well visualized +---------+---------------+---------+-----------+----------+-------------------+     Summary: RIGHT: - There is no evidence of deep vein thrombosis in the lower extremity. However, portions of this examination were limited- see technologist comments above.  - No cystic structure found in the popliteal fossa.  LEFT: - There is no evidence of deep vein thrombosis in the lower extremity. However, portions of this examination were limited- see technologist comments above.  - No cystic structure found in the popliteal fossa.  *See table(s) above for measurements and observations. Electronically signed by Lemar Livings MD on 10/08/2022 at 12:11:56 PM.    Final     Labs:  Basic Metabolic Panel: Recent Labs  Lab 10/31/22 0558  11/01/22 0735 11/01/22 1027  NA 134* 138 135  K 3.8 2.6* 4.1  CL 90* 106 96*  CO2 29 24 26   GLUCOSE 163* 144* 171*  BUN 25* 21* 25*  CREATININE 1.62* 1.11* 1.42*  CALCIUM 9.3 7.2* 9.5    CBC:    Latest Ref Rng & Units 10/31/2022    5:58 AM 10/24/2022    9:35 AM 10/17/2022    6:46 AM  CBC  WBC 4.0 - 10.5 K/uL 7.7  7.2  6.4   Hemoglobin 12.0 - 15.0 g/dL 28.4  13.2  44.0   Hematocrit 36.0 - 46.0 % 47.7  48.1  38.0   Platelets 150 - 400 K/uL 177  265  276      CBG: Recent Labs  Lab 10/31/22 1219 10/31/22 1655 10/31/22 2054 11/01/22 0631 11/01/22 1141  GLUCAP 167* 172* 235* 154* 152*    Brief HPI:   [name] is a 47 y.o. female with history of T2DM, NICM with chronic systolic/diastolic heart failure, PAF with LV thrombus was admitted on 09/22/2022 with reports of SOB and CP x 2 weeks with BLE edema.  CT chest was negative for PE and showed anasarca.  She was found to have elevated troponins with acute on chronic combined CHF and started on IV heparin as well as IV diuresis.  Cardiology consulted and felt that patient with demand ischemia due to CHF as EKG without ST changes.  Medications were adjusted to help manage fluid overload and patient was transitioned to Eliquis due to history of noncompliance with warfarin.  On afternoon on 03/02, she she developed acute onset of right gaze deviation with left hemiplegia.  CT head/neck was done revealing proximal right M2 occlusion with minimal flow throughout MCA division and she underwent cerebral angio with endovascular revascularization of occluded inferior division of the M2 segment of right MCA.  She did have brief  cardiac arrest during IR procedure required 2 minutes of ACLS prior to ROSC.  2D echo done revealing large LV apical thrombus with severe reduction in LVEF and global hypokinesis with a EF of 15%.  MRI brain revealed large area of infarct right MCA distribution with small area in right ACA distribution and acute punctate  infarct left cerebellar hemisphere.  Dr. Roda Shutters felt the stroke was likely embolic from LV thrombus with low EF and PAF even while on heparin.  Patient self extubated on 03/05 and respiratory status was stable.  Lethargy was slowly resolving, core track was removed and diet was advanced to dysphagia 3 with thin liquids.  Therapy was working with patient who continued to be limited by left hemiplegia with left inattention, right gaze preference, decrease in balance, decreased awareness of deficits, impulsivity delay in processing and decreased ability to follow one-step commands.  CIR was recommended due to functional decline.   Hospital Course: [name] was admitted to rehab 10/01/2022 for inpatient therapies to consist of PT, ST and OT at least three hours five days a week. Past admission physiatrist, therapy team and rehab RN have worked together to provide customized collaborative inpatient rehab. She continues on Eliquis and follow up CBC shows H/H to be trending up due likely due to hemoconcentration. Her blood pressures were monitored on TID basis and has been stable. Serial labs were relatively stable till 04/08 when she was noted to have rise in BUN/SCr to 25/1.62. Her LOS was extended by a day for gentle hydration with IVF with follow up labs showing improvement in SCr to 1.42. Demadex, Spironolactone and Jardiance were held for 24 hours and recommend repeat BMET on follow up cardiology appointment the next day.  Vitamin D supplement was added due to vitamin D insufficiency.    She continues to report pain left side with shooting pains in arm and leg requiring use of MS IR as needed.  She also reports ongoing issues with spasms at night affecting overall sleep hygiene.  BLE dopplers done on 03/16 due to reports of BLE pain and were negative for DVT. Robaxin has been used to help manage spasms and melatonin was added to help with sleep hygiene.  She was noted to have depressed mood therefore Lexapro  added for mood stabilization.  Team has been providing ego support as well as encouragement during his stay.  P.o. intake has varied due to dislike of hospital food and megace was added to help stimulate appetite.  Family has provided additional food from home to help with intake.  Patient's blood sugars were monitored on ACHS basis and glipizide was added to help with blood sugar control.  Bowel program has been augmented to help manage constipation.  Amantadine was added for activation and to help with attention to tasks. She has developed increased tone due to left hemiplegia has been managed with splinting, range of motion as well as local measures including K-pad and Sportscreme.  She has been advised to continue using her resting splints at night. She continues to be limited by left sided weakness with sensory deficits. Family has been educated on managing patient at wheelchair level. Patient has been educated on safe swallow strategies as well as utilization of external and internal aids to assist with memory and recall. She will continue to receive follow up HHPT, HHOT, HHST and HHRN by [facility] after discharge.    Rehab course: During patient's stay in rehab weekly team conferences were held to  monitor patient's progress, set goals and discuss barriers to discharge. At admission, patient required mod to max assist with ADL tasks and max assist with mobility. She exhibited mild dysarthria with decreased in vocal intenstiy and needed modified diet due to mild bolus holding with pocketing and decrease in bolus formation.  She  has had improvement in activity tolerance, balance, postural control as well as ability to compensate for deficits. She requires min assist for bathing tasks and for upper body dressing.  She requires mod assist for lower body dressing.  She requires min assist for transfers with tactile cues for sequencing with, weight shifting and for initiation.  She is able to ambulate  50 feet with max assist and use of a walker.  She continues to require supervision to min assist for cognition l.  She requires supervision at meals and has refused trials of regular textures.  She requested continuing dysphagia 3 diet and was discharged on this per her preference.   Family education has been completed regarding all aspects of physical and cognitive assistance needed after discharge.  Family was also advised to manage and administer medications at discharge.  Disposition: Home.  Diet: Heart Healthy/Carb Modified.   Special Instructions: Recommend repeat BMET 04/10 to monitor for stability and input on further adjustment of cardiac meds on follow up with cardiology.  2.  Monitor blood sugars before  meals and at bedtime. Follow up with PCP for input on resumption of insulin.   Discharge Instructions     Ambulatory referral to Physical Medicine Rehab   Complete by: As directed       Allergies as of 11/01/2022       Reactions   Fish Allergy Swelling   Shellfish Allergy Anaphylaxis, Swelling, Other (See Comments)   Airway involvement including EMS - Has Epi-Pen   Metformin And Related Diarrhea   Trulicity [dulaglutide] Nausea And Vomiting, Other (See Comments)   Multiple episodes of vomiting over multiple days   Nsaids Other (See Comments)   Per PCP interferes with daily meds (heart failure)   Advil [ibuprofen] Other (See Comments)   Per PCP interferes with daily meds (heart failure)        Medication List     STOP taking these medications    Lantus SoloStar 100 UNIT/ML Solostar Pen Generic drug: insulin glargine   NovoLOG FlexPen 100 UNIT/ML FlexPen Generic drug: insulin aspart   polyethylene glycol 17 g packet Commonly known as: MIRALAX / GLYCOLAX Replaced by: polyethylene glycol powder 17 GM/SCOOP powder   prochlorperazine 5 MG tablet Commonly known as: COMPAZINE   TechLite Pen Needles 31G X 5 MM Misc Generic drug: Insulin Pen Needle       TAKE  these medications    Accu-Chek Softclix Lancets lancets Use up 4 (four) times daily as directed What changed: Another medication with the same name was removed. Continue taking this medication, and follow the directions you see here.   acetaminophen 500 MG tablet Commonly known as: TYLENOL Take 500-1,000 mg by mouth every 6 (six) hours as needed for mild pain or headache.   amantadine 100 MG capsule Commonly known as: SYMMETREL Take 2 capsules (200 mg total) by mouth daily.   atorvastatin 80 MG tablet Commonly known as: LIPITOR Take 1 tablet (80 mg total) by mouth daily after supper. What changed: when to take this   blood glucose meter kit and supplies Use up to 4 (four) times daily as directed   Blood Glucose Monitoring Suppl Devi 1  each by Does not apply route in the morning, at noon, and at bedtime. May substitute to any manufacturer covered by patient's insurance.   Calcium Antacid 500 MG chewable tablet Generic drug: calcium carbonate Chew 1 tablet (200 mg of elemental calcium total) by mouth daily after supper.   docusate sodium 100 MG capsule Commonly known as: COLACE Take 1 capsule (100 mg total) by mouth daily.   Eliquis 5 MG Tabs tablet Generic drug: apixaban Take 1 tablet (5 mg total) by mouth 2 (two) times daily.   escitalopram 10 MG tablet Commonly known as: LEXAPRO Take 1 tablet (10 mg total) by mouth at bedtime.   glipiZIDE 5 MG tablet Commonly known as: GLUCOTROL Take 1 tablet (5 mg total) by mouth daily before breakfast.   hydrOXYzine 10 MG tablet Commonly known as: ATARAX Take 1 tablet (10 mg total) by mouth 3 (three) times daily as needed. What changed: reasons to take this Notes to patient: For anxiety   Jardiance 25 MG Tabs tablet Generic drug: empagliflozin Take 1 tablet (25 mg total) by mouth daily. Start taking on: November 02, 2022 What changed:  medication strength how much to take   Lancet Device Misc 1 each by Does not apply route  in the morning, at noon, and at bedtime. May substitute to any manufacturer covered by patient's insurance.   Lancets Misc. Misc 1 each by Does not apply route in the morning, at noon, and at bedtime. May substitute to any manufacturer covered by patient's insurance.   losartan 50 MG tablet Commonly known as: COZAAR Take 1 tablet (50 mg total) by mouth daily.   megestrol 40 MG/ML suspension Commonly known as: MEGACE Take 10 mLs (400 mg total) by mouth 2 (two) times daily.   melatonin 5 MG Tabs Take 1 tablet (5 mg total) by mouth at bedtime.   methocarbamol 500 MG tablet Commonly known as: ROBAXIN Take 1 tablet (500 mg total) by mouth every 6 (six) hours as needed for muscle spasms.   metoprolol tartrate 25 MG tablet Commonly known as: LOPRESSOR Take 1 tablet (25 mg total) by mouth 2 (two) times daily.   morphine 15 MG tablet--Rx # 10 pills Commonly known as: MSIR take 1 tablet (15 mg total) by mouth every 8 (eight) hours as needed for severe pain. Notes to patient: Limit to once a day as needed for severe pain   multivitamin with minerals Tabs tablet Take 1 tablet by mouth daily.   Muscle Rub 10-15 % Crea Apply 1 Application topically 3 (three) times daily.   OneTouch Verio test strip Generic drug: glucose blood Use up to 4 (four) times daily as directed What changed: Another medication with the same name was added. Make sure you understand how and when to take each.   BLOOD GLUCOSE TEST STRIPS Strp Inject 1 each into the skin 4 (four) times daily -  before meals and at bedtime. May substitute to any manufacturer covered by patient's insurance. What changed: You were already taking a medication with the same name, and this prescription was added. Make sure you understand how and when to take each.   pantoprazole 40 MG tablet Commonly known as: PROTONIX Take 1 tablet (40 mg total) by mouth at bedtime.   polyethylene glycol powder 17 GM/SCOOP powder Commonly known as:  GLYCOLAX/MIRALAX Take 1 capful (17 g) by mouth 2 (two) times daily. Replaces: polyethylene glycol 17 g packet   Senexon-S 8.6-50 MG tablet Generic drug: senna-docusate Take 3 tablets by  mouth daily with lunch. What changed:  how much to take when to take this reasons to take this   spironolactone 25 MG tablet Commonly known as: ALDACTONE Take 1 tablet (25 mg total) by mouth daily. What changed: additional instructions   torsemide 20 MG tablet Commonly known as: DEMADEX Take 1 tablet (20 mg total) by mouth daily. What changed: how much to take   traZODone 50 MG tablet Commonly known as: DESYREL Take 0.5 tablets (25 mg total) by mouth at bedtime. Notes to patient: For sleep   Vitamin D (Ergocalciferol) 1.25 MG (50000 UNIT) Caps capsule Commonly known as: DRISDOL Take 1 capsule (50,000 Units total) by mouth every 7 (seven) days. Notes to patient: Take once a week on Wednesdays        Follow-up Information     Ganta, Anupa, DO .   Specialty: Family Medicine Why: Call in 1-2 days for post hospital follow up Contact information: [address] [place] [address] [phone]         Horton Chin, MD Follow up.   Specialty: Physical Medicine and Rehabilitation Why: office will call you with follow up appointment Contact information: [address] [place] [address] [phone]         [facility] Follow up.   Why: office will call you with follow up appointment Contact information: [address]     [address] [place] [address] [phone]        Maisie Fus, MD Follow up on 11/02/2022.   Specialty: Cardiology Why: Be there at 4:00 pm for 4:20 appointment. Recheck BMET Contact information: [address] [place] [address] [phone]                 Signed: Jacquelynn Cree 11/01/2022, 9:59 PM

## 2022-10-04 NOTE — Progress Notes (Signed)
Physical Therapy Session Note  Patient Details  Name: [name] MRN: [id number] Date of Birth: 1975-08-02  Today's Date: 10/04/2022 PT Individual Time: 0800-0912 PT Individual Time Calculation (min): 72 min   Short Term Goals: Week 1:  PT Short Term Goal 1 (Week 1): Pt will roll side to side w/ min A. PT Short Term Goal 2 (Week 1): Pt will transfer sup to sit w/ mod A PT Short Term Goal 3 (Week 1): Pt will maintain sitting balance x 5' w/ min A PT Short Term Goal 4 (Week 1): PT to assess gait.  Skilled Therapeutic Interventions/Progress Updates:    pt received in bed and agreeable to therapy. Pt reports no pain right now but that she does typically has neck and back pain after therapy. Reports back pain is chronic but worse than before her stroke and the neck pain is new.  Therapist donned ted hose and socks dependently. Pt able to assist with threading R foot and instructed to attempt to lift LLE to thread L foot. Pt rolled to L side with set up of L hemi body and step by step cues, mod A. Rolled to L side with max A and similar set up and step by step cueing. Supine>sit with max A for BLE management and trunk elevation, cueing through flexion/rotation technique. Pt was able to sit EOB with RUE support and close supervision.  Pt requested to use BSC. Stedy transfer to [facility] with mod A to stand at stedy for L hemi body management, stedy transfer to [facility]. Continent void on commode, documented in flow sheet. +2 assist for hygiene in standing and stedy transfer to w/c. Pt recd meds whiule seated in w/c, engaging in appropriate conversation.   Pt reports she has not eaten. Set up with breakfast tray and therapist provided supervision and cues per SLP precautions sheet in room. Set up with syrup on L side to prompt turning head to L side for improved L attention, with able to turn head across midline without cues after this.  Cues throughout for attending to L side. Pt with one instance of clearing  throat but otherwise no s/s of aspiration or changed vocal quality.  Pt remained seated in w/c at end of session, was left with all needs in reach and alarm active.   Therapy Documentation Precautions:  Precautions Precautions: Fall Precaution Comments: Left hemiparesis Required Braces or Orthoses: Other Brace Other Brace: L Resting hand splint and L PRAFO Restrictions Weight Bearing Restrictions: No General:       Therapy/Group: Individual Therapy  Mickel Fuchs 10/04/2022, 8:46 AM

## 2022-10-04 NOTE — IPOC Note (Signed)
Overall Plan of Care [facility]) Patient Details Name: [name] MRN: [id number] DOB: 04-16-1976  Admitting Diagnosis: Acute ischemic right MCA stroke [facility])  Hospital Problems: Principal Problem:   Acute ischemic right MCA stroke [facility]) Active Problems:   Insomnia     Functional Problem List: Nursing Bowel, Bladder, Safety, Endurance, Medication Management, Pain, Nutrition  PT Balance, Perception, Safety, Endurance, Motor, Pain  OT Balance, Cognition, Endurance, Motor, Perception, Safety, Sensory, Vision  SLP Cognition  TR         Basic ADL's: OT Grooming, Bathing, Dressing, Toileting     Advanced  ADL's: OT       Transfers: PT Bed Mobility, Bed to Chair, Car, Manufacturing systems engineer, Metallurgist: PT Ambulation, Emergency planning/management officer, Stairs     Additional Impairments: OT Fuctional Use of Upper Extremity  SLP Swallowing      TR      Anticipated Outcomes Item Anticipated Outcome  Self Feeding    Swallowing  Mod I   Basic self-care  Min A  Toileting  Min A   Bathroom Transfers Min A  Bowel/Bladder  manage bowel w mod I and bladder w toileting  Transfers  CGA  Locomotion  min A w/ LRAD.  Communication  Indep  Cognition  Min A  Pain  < 4 with prns  Safety/Judgment  manage w cues   Therapy Plan: PT Intensity: Minimum of 1-2 x/day ,45 to 90 minutes PT Frequency: 5 out of 7 days PT Duration Estimated Length of Stay: 3-4 weeks. OT Intensity: Minimum of 1-2 x/day, 45 to 90 minutes OT Frequency: 5 out of 7 days OT Duration/Estimated Length of Stay: 21-28 days SLP Intensity: Minumum of 1-2 x/day, 30 to 90 minutes SLP Frequency: 3 to 5 out of 7 days SLP Duration/Estimated Length of Stay: 3-4 weeks   Team Interventions: Nursing Interventions Bladder Management, Medication Management, Discharge Planning, Bowel Management, Patient/Family Education, Pain Management, Disease Management/Prevention  PT interventions Ambulation/gait training,  Community reintegration, DME/adaptive equipment instruction, Neuromuscular re-education, Stair training, UE/LE Strength taining/ROM, Wheelchair propulsion/positioning, Training and development officer, Discharge planning, Functional electrical stimulation, Therapeutic Activities, UE/LE Coordination activities, Functional mobility training, Patient/family education, Therapeutic Exercise  OT Interventions Balance/vestibular training, Cognitive remediation/compensation, Discharge planning, DME/adaptive equipment instruction, Functional electrical stimulation, Functional mobility training, Neuromuscular re-education, Patient/family education, Psychosocial support, Self Care/advanced ADL retraining, Splinting/orthotics, Therapeutic Activities, Therapeutic Exercise, UE/LE Strength taining/ROM, UE/LE Coordination activities, Visual/perceptual remediation/compensation, Wheelchair propulsion/positioning  SLP Interventions Cognitive remediation/compensation, Internal/external aids, Multimodal communication approach, Patient/family education, Medication managment, Dysphagia/aspiration precaution training  TR Interventions    SW/CM Interventions Discharge Planning, Psychosocial Support, Patient/Family Education   Barriers to Discharge MD  Medical stability  Nursing Decreased caregiver support, Home environment access/layout 2 level 2ste no rails, main B+B w daughter;family works but will arrange coverage 24/7 w boyfriend [place] environment access/layout bed and bath 2nd floor.  OT Inaccessible home environment, Home environment access/layout, Weight    SLP      SW       Team Discharge Planning: Destination: PT-Home ,OT- Home , SLP-Home Projected Follow-up: PT-Home health PT, OT-  Home health OT, SLP-Home Health SLP, Outpatient SLP Projected Equipment Needs: PT-To be determined, OT- To be determined, SLP-None recommended by SLP Equipment Details: PT- , OT-  Patient/family involved in  discharge planning: PT- Patient,  OT-Patient, SLP-Patient, Family member/caregiver  MD ELOS: 21-28 days Medical Rehab Prognosis:  Excellent Assessment: The patient has been admitted for CIR therapies with the diagnosis of  embolic right MCA infarct. The team will be addressing functional mobility, strength, stamina, balance, safety, adaptive techniques and equipment, self-care, bowel and bladder mgt, patient and caregiver education. Goals have been set at [facility]. Anticipated discharge destination is home.        See Team Conference Notes for weekly updates to the plan of care

## 2022-10-04 NOTE — Plan of Care (Signed)
  Problem: RH Swallowing Goal: LTG Patient will consume least restrictive diet using compensatory strategies with assistance (SLP) Description: LTG:  Patient will consume least restrictive diet using compensatory strategies with assistance (SLP) Flowsheets (Taken 10/04/2022 1527) LTG: Pt Patient will consume least restrictive diet using compensatory strategies with assistance of (SLP): Supervision   Problem: RH Cognition - SLP Goal: RH LTG Patient will demonstrate orientation with cues Description:  LTG:  Patient will demonstrate orientation to person/place/time/situation with cues (SLP)   Flowsheets (Taken 10/04/2022 1527) LTG Patient will demonstrate orientation to:  Place  Time  Situation LTG: Patient will demonstrate orientation using cueing (SLP): Supervision   Problem: RH Memory Goal: LTG Patient will demonstrate ability for day to day (SLP) Description: LTG:   Patient will demonstrate ability for day to day recall/carryover during cognitive/linguistic activities with assist  (SLP) Flowsheets (Taken 10/04/2022 1527) LTG: Patient will demonstrate ability for day to day recall: New information LTG: Patient will demonstrate ability for day to day recall/carryover during cognitive/linguistic activities with assist (SLP): Minimal Assistance - Patient > 75% Goal: LTG Patient will use memory compensatory aids to (SLP) Description: LTG:  Patient will use memory compensatory aids to recall biographical/new, daily complex information with cues (SLP) Flowsheets (Taken 10/04/2022 1527) LTG: Patient will use memory compensatory aids to (SLP): Supervision   Problem: RH Attention Goal: LTG Patient will demonstrate this level of attention during functional activites (SLP) Description: LTG:  Patient will will demonstrate this level of attention during functional activites (SLP) Flowsheets (Taken 10/04/2022 1527) Patient will demonstrate during cognitive/linguistic activities the attention type of:  Sustained Patient will demonstrate this level of attention during cognitive/linguistic activities in: Controlled LTG: Patient will demonstrate this level of attention during cognitive/linguistic activities with assistance of (SLP): Supervision Number of minutes patient will demonstrate attention during cognitive/linguistic activities: 20   Problem: RH Awareness Goal: LTG: Patient will demonstrate awareness during functional activites type of (SLP) Description: LTG: Patient will demonstrate awareness during functional activites type of (SLP) Flowsheets (Taken 10/04/2022 1527) Patient will demonstrate during cognitive/linguistic activities awareness type of:  Intellectual  Emergent LTG: Patient will demonstrate awareness during cognitive/linguistic activities with assistance of (SLP): Supervision

## 2022-10-04 NOTE — Progress Notes (Signed)
Speech Language Pathology Daily Session Note  Patient Details  Name: [name] MRN: [id number] Date of Birth: [birth year]  Today's Date: 10/04/2022 SLP Individual Time: 0915-1008 SLP Individual Time Calculation (min): 53 min  Short Term Goals: Week 1: SLP Short Term Goal 1 (Week 1): Pt will improve orientation to person, place, time and situation given mod verbal and visual cues with 100% acc SLP Short Term Goal 2 (Week 1): Pt will clear dys 3 solids from lateral sulcus given min verbal and visual cues with 80% acc SLP Short Term Goal 3 (Week 1): Pt will recall novel, functional information post 3-5 minute delay with 80% acc with mod verbal and visual cues. SLP Short Term Goal 4 (Week 1): Pt will consume thin liquids without s/sx pen/asp given min A in 90% opportunities SLP Short Term Goal 5 (Week 1): Pt will identify solutions to problems within least restrictive environment with 90%acc with min verbal and visual cues.   Skilled Therapeutic Interventions: Pt seen this date for skilled ST intervention targeting cognitive goals outlined in care plan. Pt received visibly fatigued and OOB in w/c; recently finished with PT and AM meal. Agreeable to intervention in speech office. Delayed processing and flat affect noted throughout.  SLP facilitated today's session by providing Mod A of backward chaining to successfully dial father's phone number on her cell phone (at pt's request); without intervention, pt unable to complete any sequence of the task outlined above.  Impairment in initiation noted across tasks to include when she called her father to ask questions re: a previous text her father sent.   To facilitate orientation and recall, SLP provided a calendar. With use of calendar, pt demonstrated orientation to self, location, and situation with Sup A; date with Mod A verbal and visual cues. At pt's request to improve her intellectual and emergent awareness re: her current debility and  function, SLP provided information to pt outlined in H & P re: her stroke, date of admission, loss of pulse, attempted procedure. Immediately following education, pt recalled provided information given overall Sup-Min A verbal cues from therapist.   Additionally, provided skilled education re: use of external compensatory memory strategies to include use of external aids to facilitate accurate recall/errorless learning. Informally, pt demonstrated poor sustained attention noted throughout task and required Min A for redirection.   Lastly, to address safety/error awareness, pt answered close-ended wh-questions re: problem/solution when presented with pictured scenarios that were unsafe. Pt benefited from from [place] A verbal cues to verbalize problem and possible solutions. Pt did communicate need to void; therefore, brought back to her room and transferred to toilet, via safety plan. Incontinent of urine - flowsheet updated. NT and RN present to provide assistance.    Pt returned to room and left OOB in w/c with all safety measures activated, call bell within reach, and all known immediate needs met. NT and LPN present to provide ongoing assistance with toileting. Continue per current ST POC.   Pain No pain reported to this therapist; NAD  Therapy/Group: Individual Therapy  Keymani Mclean A Jacklynn Dehaas 10/04/2022, 12:42 PM

## 2022-10-04 NOTE — Telephone Encounter (Signed)
Opened in error

## 2022-10-04 NOTE — Progress Notes (Signed)
Occupational Therapy Session Note  Patient Details  Name: PEGGYE BORMAN MRN: [id number] Date of Birth: 1975-08-20  Today's Date: 10/04/2022 OT Individual Time: [id number] OT Individual Time Calculation (min): 61 min    Short Term Goals: Week 1:  OT Short Term Goal 1 (Week 1): Pt will complete STS transfer with Mod A + LRAD, in preparation for ADL participation. OT Short Term Goal 2 (Week 1): Pt will complete squat-pivot transfer from EOB<>BSC with Mod A + LRAD. OT Short Term Goal 3 (Week 1): Pt will complete LB ADLs with Max A + LRAD.  Skilled Therapeutic Interventions/Progress Updates:  Pt greeted seated in w/c with daughter present, pt agreeable to OT intervention. Pt reports urgency to void bladder, utilized stedy and +2 assist for transfer to [facility]. Pt able to stand to stedy with MODA +2 d/t L lean in standing, dependent transfer ontto BSC. Pt with continent urine void needing total A for 3/3 toileting tasks tasks.   Total A transport to gym in w/c where pt completed squat pivot to EOM to R side with MAX A+2. Once EOM but able to maintain static sitting balance with CGA. Worked on lateral leans to LUE with pt needing proximal support at L shoulder for stabilization, pt needed up to MODA to return back to midline but had moments of only needing MINA. Utilized mirror in front of pt for visual feedback. Pt continues to present with L inattention preferrign to keep head turned to R side, worked on keeping head at midline and even turning more towards L side.   Graded task up and had pt lean onto L forearm while RUE crossed midline to retrieve bean bags on L side of mat and then place on R side of mat table. Pt completed leans with overall MIN- MODA to return to midline.      Pt able to complete x2 sit>stands from mat table with MODA +2 and L knee blocked, pt needed cues for upright posture, anterior weight shift and glute activation. Pt greatly fatigued with standing. Pt completed squat pivot back  to w/c to R side with MAX A +2, pt needs cues to shift trunk forward and for correct head/hips relationship.   Total A transport back to room where pt attempted stand pivot transfer back to bed to R side however pt impulsively stood from w/c and unable to get pts LLE in optimal position with pt needing totalA +2 to lower pt to EOB.  MAX A +2 to return to supine with pt needing assist to elevate BLEs back to bed.   Education provided to pts daughter on working on PROM to [place] as needed, daughter verbalized understanding.                Ended session with pt supine in bed with all needs within reach and bed alarm activated.                    Therapy Documentation Precautions:  Precautions Precautions: Fall Precaution Comments: Left hemiparesis Required Braces or Orthoses: Other Brace Other Brace: L Resting hand splint and L PRAFO Restrictions Weight Bearing Restrictions: No  Pain: unrated pain reported in neck and back, rest breaks provided and asked nurse for pain meds at end of session     Therapy/Group: Individual Therapy  Corinne Ports [facility] 10/04/2022, 12:18 PM

## 2022-10-04 NOTE — Telephone Encounter (Signed)
Contacted [name] to schedule their annual wellness visit. Welcome to Medicare visit Due by  03/26/2023.  Thank you,  [place] Direct dial  [phone]

## 2022-10-05 LAB — BASIC METABOLIC PANEL
Anion gap: 10 (ref 5–15)
BUN: 14 mg/dL (ref 6–20)
CO2: 29 mmol/L (ref 22–32)
Calcium: 8.7 mg/dL — ABNORMAL LOW (ref 8.9–10.3)
Chloride: 98 mmol/L (ref 98–111)
Creatinine, Ser: 1.25 mg/dL — ABNORMAL HIGH (ref 0.44–1.00)
GFR, Estimated: 55 mL/min — ABNORMAL LOW (ref >60)
Glucose, Bld: 126 mg/dL — ABNORMAL HIGH (ref 70–99)
Potassium: 4 mmol/L (ref 3.5–5.1)
Sodium: 137 mmol/L (ref 135–145)

## 2022-10-05 LAB — GLUCOSE, CAPILLARY
Glucose-Capillary: 161 mg/dL — ABNORMAL HIGH (ref 70–99)
Glucose-Capillary: 146 mg/dL — ABNORMAL HIGH (ref 70–99)
Glucose-Capillary: 171 mg/dL — ABNORMAL HIGH (ref 70–99)
Glucose-Capillary: 191 mg/dL — ABNORMAL HIGH (ref 70–99)

## 2022-10-05 MED ORDER — AMANTADINE HCL 100 MG PO CAPS
100.0000 mg | ORAL_CAPSULE | Freq: Every day | ORAL | Status: DC
  Administered 2022-10-05 – 2022-10-06 (×2): 100 mg via ORAL
  Filled 2022-10-05 (×3): qty 1

## 2022-10-05 MED ORDER — VITAMIN D (ERGOCALCIFEROL) 1.25 MG (50000 UNIT) PO CAPS
50000.0000 [IU] | ORAL_CAPSULE | ORAL | Status: DC
  Administered 2022-10-12 – 2022-10-26 (×3): 50000 [IU] via ORAL
  Filled 2022-10-05 (×3): qty 1

## 2022-10-05 MED ORDER — VITAMIN D 25 MCG (1000 UNIT) PO TABS
2000.0000 [IU] | ORAL_TABLET | Freq: Every day | ORAL | Status: DC
  Administered 2022-10-05 – 2022-10-10 (×6): 2000 [IU] via ORAL
  Filled 2022-10-05 (×6): qty 2

## 2022-10-05 MED ORDER — VITAMIN D (ERGOCALCIFEROL) 1.25 MG (50000 UNIT) PO CAPS: 50000.0000 [IU] | ORAL_CAPSULE | ORAL | Status: DC

## 2022-10-05 NOTE — Progress Notes (Signed)
PROGRESS NOTE   Subjective/Complaints: No new complaints this morning AKI improved today Worked well with therapy Has a very flat affect  ROS: denies pain at rest, but has back and neck pain after therapy   Objective:   No results found. Recent Labs    10/03/22 0550  WBC 7.7  HGB 12.1  HCT 38.8  PLT 295   Recent Labs    10/04/22 0529 10/05/22 0556  NA 137 137  K 4.1 4.0  CL 94* 98  CO2 31 29  GLUCOSE 127* 126*  BUN 19 14  CREATININE 1.40* 1.25*  CALCIUM 8.9 8.7*    Intake/Output Summary (Last 24 hours) at 10/05/2022 1113 Last data filed at 10/05/2022 0630 Gross per 24 hour  Intake 198 ml  Output 1300 ml  Net -1102 ml        Physical Exam: Vital Signs Blood pressure (!) 142/72, pulse 70, temperature 98.1 F (36.7 C), temperature source Oral, resp. rate 16, height '4\' 11"'$  (1.499 m), weight 83.8 kg, last menstrual period 06/22/2022, SpO2 100 %. Constitutional:      General: She is not in acute distress.    Appearance: She is obese. BMI 37.31    Comments: Kept eyes turned to the right but able to turn to midline and left with minimal cues.   HENT:     Head: Normocephalic.     Mouth/Throat:     Mouth: Mucous membranes are moist.     Comments: Tongue with white coating Eyes:     Conjunctiva/sclera: Conjunctivae normal.     Pupils: Pupils are equal, round, and reactive to light.  Cardiovascular:     Rate and Rhythm: Normal rate and regular rhythm.  Pulmonary:     Effort: Pulmonary effort is normal. No respiratory distress.     Breath sounds: Normal breath sounds. No wheezing.  Abdominal:     General: Bowel sounds are normal.     Palpations: Abdomen is soft.  Musculoskeletal:        General: No swelling or tenderness.  Skin:    General: Skin is warm and dry.  Neurological:     Mental Status: She is alert.     Comments: Pt appears lethargic but awakens and responds to questions. Speech  dysarthric, low volume. Language appears to be intact. Right gaze preference but can follow to left to a lesser extent. No obvious field cut. Left central 7 and tongue deviation. LUE and LLE 0/5. Senses pain and LT in all 4's. No resting tone. DTR's 1+ throughout. Toes down left foot, no clonus.  Max A rolling Psychiatric:     Comments: Flat, depressed, decreased attention   Assessment/Plan: 1. Functional deficits which require 3+ hours per day of interdisciplinary therapy in a comprehensive inpatient rehab setting. Physiatrist is providing close team supervision and 24 hour management of active medical problems listed below. Physiatrist and rehab team continue to assess barriers to discharge/monitor patient progress toward functional and medical goals  Care Tool:  Bathing    Body parts bathed by patient: Chest, Abdomen, Front perineal area, Face   Body parts bathed by helper: Right arm, Left arm, Buttocks, Right upper leg, Right lower  leg, Left upper leg, Left lower leg     Bathing assist Assist Level: Total Assistance - Patient < 25%     Upper Body Dressing/Undressing Upper body dressing   What is the patient wearing?: Pull over shirt    Upper body assist Assist Level: Maximal Assistance - Patient 25 - 49%    Lower Body Dressing/Undressing Lower body dressing      What is the patient wearing?: Pants     Lower body assist Assist for lower body dressing: Total Assistance - Patient < 25%     Toileting Toileting    Toileting assist Assist for toileting: Dependent - Patient 0%     Transfers Chair/bed transfer  Transfers assist  Chair/bed transfer activity did not occur: Safety/medical concerns  Chair/bed transfer assist level: 2 Helpers (MAXX +2 squat pivot)     Locomotion Ambulation   Ambulation assist   Ambulation activity did not occur: Safety/medical concerns          Walk 10 feet activity   Assist  Walk 10 feet activity did not occur:  Safety/medical concerns        Walk 50 feet activity   Assist Walk 50 feet with 2 turns activity did not occur: Safety/medical concerns         Walk 150 feet activity   Assist Walk 150 feet activity did not occur: Safety/medical concerns         Walk 10 feet on uneven surface  activity   Assist Walk 10 feet on uneven surfaces activity did not occur: Safety/medical concerns         Wheelchair     Assist Is the patient using a wheelchair?: Yes Type of Wheelchair: Manual Wheelchair activity did not occur: Safety/medical concerns         Wheelchair 50 feet with 2 turns activity    Assist    Wheelchair 50 feet with 2 turns activity did not occur: Safety/medical concerns       Wheelchair 150 feet activity     Assist  Wheelchair 150 feet activity did not occur: Safety/medical concerns       Blood pressure (!) 142/72, pulse 70, temperature 98.1 F (36.7 C), temperature source Oral, resp. rate 16, height '4\' 11"'$  (1.499 m), weight 83.8 kg, last menstrual period 06/22/2022, SpO2 100 %.    Medical Problem List and Plan: 1. 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  Antithrombotics: -DVT/anticoagulation:  Pharmaceutical: Eliquis             -antiplatelet therapy: N/A 3.Back pain/Pain Management:  Tylenol prn. Oxycodone making her nausea worse and ineffective--?? 4. Mood/Behavior/Sleep: LCSW to follow for evaluation and support.              -antipsychotic agents: N/A             -pt reports chronic anxiety at baseline. Expresses that she's depressed as well currently.              -took atarax at home tid prn. Will resume.             -begin lexapro '5mg'$  qhs             -  neuropsych eval 5. Neuropsych/cognition: This patient is not capable of making decisions  on her own behalf. 6. Skin/Wound Care:  Routine pressure relief  measures.  7. Fluids/Electrolytes/Nutrition: Monitor I/O. Check CMET on 03/11.  8. Acute on chronic combined CHF: HH diet.  -Monitor weights daily and for signs of overload.             --continue Demadex, Cozaar, Spironolactone, Jardiance and Lopressor  Weight reviewed 3/11 and is down to 85.2 kg 9. T2DM: Hgb A1C-10.8  (improving >15 @ 11/23). Was on Lantus 15u w/ novolog 12 units with supper             --monitor BS ac/hs and use SSI for elevated BS             --on SSI   -magnesium level reviewed and is normal.   -increase Jardiance to '25mg'$  10. HTN: potassium and magnesium reviewed and normal. Continue Spironolactone.  11. Severe obesity: Educate patient on diet, compliance and exercise to help promote health and mobility. Magnesium reviewed and normal.  12. AKI: Diuretic held. Cr improved 3/13.  13. Decreased initiation: start amantadine '100mg'$  daily.  14. Vitamin D insufficiency: start ergocalciferol 50,000U once per week for 7 weeks   LOS: 4 days A FACE TO FACE EVALUATION WAS PERFORMED  [name] 10/05/2022, 11:14 AM

## 2022-10-05 NOTE — Plan of Care (Signed)
  Problem: RH BOWEL ELIMINATION Goal: RH STG MANAGE BOWEL WITH ASSISTANCE Description: STG Manage Bowel with min Assistance. Outcome: Not Progressing; iincontinence

## 2022-10-05 NOTE — Progress Notes (Signed)
Physical Therapy Session Note  Patient Details  Name: TAMEAKA CANNOY MRN: [id number] Date of Birth: 05/10/1976  Today's Date: 10/05/2022 PT Individual Time: [id number] PT Individual Time Calculation (min): 43 min   Short Term Goals: Week 1:  PT Short Term Goal 1 (Week 1): Pt will roll side to side w/ min A. PT Short Term Goal 2 (Week 1): Pt will transfer sup to sit w/ mod A PT Short Term Goal 3 (Week 1): Pt will maintain sitting balance x 5' w/ min A PT Short Term Goal 4 (Week 1): PT to assess gait.  Skilled Therapeutic Interventions/Progress Updates:    Patietn received in room in WC< family at bedside assisting with   Therapy Documentation Precautions:  Precautions Precautions: Fall Precaution Comments: Left hemiparesis Required Braces or Orthoses: Other Brace Other Brace: L Resting hand splint and L PRAFO Restrictions Weight Bearing Restrictions: No    Therapy/Group: Individual Therapy   Verner Mould, DPT Acute Rehabilitation Services Office [phone]  10/05/22 1:02 PM

## 2022-10-05 NOTE — Progress Notes (Signed)
Physical Therapy Session Note  Patient Details  Name: [name] MRN: [id number] Date of Birth: [birth year]  Today's Date: 10/05/2022 PT Individual Time: 1015-1100 PT Individual Time Calculation (min): 45 min   Short Term Goals: Week 1:  PT Short Term Goal 1 (Week 1): Pt will roll side to side w/ min A. PT Short Term Goal 2 (Week 1): Pt will transfer sup to sit w/ mod A PT Short Term Goal 3 (Week 1): Pt will maintain sitting balance x 5' w/ min A PT Short Term Goal 4 (Week 1): PT to assess gait.  Skilled Therapeutic Interventions/Progress Updates:      Pt sitting in w/c to start - on speaker phone with a family member who is asking her to cash app her money to help with transportation to visit her. Patient reporting that since her CVA, she has had memory problem and needs assistance to use the app on her phone. Task made therapeutic with questioning and subtle cues .  Transported patient to main rehab gym for time. Asked patient to transfer to mat table to assess problem solving, safety awareness, and motor planning. Patient leaning on her R elbow from w/c to mat table, lacking awareness and sequencing deficits.  Completed maxA squat<>pivot transfer towards her stronger R side from w/c to mat table.   Worked on static sitting balance to promote upright positioning and improve anterior pelvic tilt. Pt with thoracic shift towards her L with pelvic obliquity on her R. Spent some time working on external and internal cueing to correct. Repeated sit<>stands completed with PT caging her L knee for safety to prevent full buckling and providing extension support on L flank to improve upright positioning. Also used back of arm chair on her R for visual cue for standing balance. Patient unable to correct knee buckling on L - encouraged quad and glut isometrics in standing for strengthening.  Returned to her w/c and patient needing maxA for posterior scooting in her w/c. Returned to her room and  concluded session with 1/2 lap tray supporting LUE, safety belt alarm on, call bell in lap.   Therapy Documentation Precautions:  Precautions Precautions: Fall Precaution Comments: Left hemiparesis Required Braces or Orthoses: Other Brace Other Brace: L Resting hand splint and L PRAFO Restrictions Weight Bearing Restrictions: No General:      Therapy/Group: Individual Therapy  [name] 10/05/2022, 7:47 AM

## 2022-10-05 NOTE — Progress Notes (Addendum)
Occupational Therapy Session Note  Patient Details  Name: [name] MRN: [id number] Date of Birth: 1976/04/08  Today's Date: 10/05/2022 OT Individual Time: [id number] and 1418-1500 OT Individual Time Calculation (min): 72 min and 42 min   Short Term Goals: Week 1:  OT Short Term Goal 1 (Week 1): Pt will complete STS transfer with Mod A + LRAD, in preparation for ADL participation. OT Short Term Goal 2 (Week 1): Pt will complete squat-pivot transfer from EOB<>BSC with Mod A + LRAD. OT Short Term Goal 3 (Week 1): Pt will complete LB ADLs with Max A + LRAD.  Skilled Therapeutic Interventions/Progress Updates:    AM Session:  Patient received supine in bed - being changed by nursing staff.  Patient assisted to roll over and sit at edge of bed.  Worked on postural control in sitting.  Patient drifts to left and backward.  Patient is confusing left and right.  Patient has difficulty following temporal instructions - left/right/forward/ backward.  Helped patient to complete sponge bathing and dressing while seated at edge of bed.  Patient able to stand with mod assist.  No volitional movement noted when asked in left arm/leg.  Patient inattentive to left of midline - body and environment.  Completed stand pivot transfer to wheelchair and ate breakfast with assist for set up, location of food items, and cueing to clear left cheek/ wipe left side of mouth.  Patient left up in wheelchair for next session.  Safety belt in place and engaged and personal items/ call bell in reach.    PM Session:  Patient received sitting in wheelchair, daughter present.  Patient transported to gym to address postural control, Neuromuscular reeducation to left limbs and attention to left.  Biased sitting to left and placed left arm onto mat table.  Patient able to visually attend and slide left arm off table.  Sit to stand with mod assist controlling for placement of LLE.  Patient at one point while standing patient reported  pain in left foot (needles) and withdrew foot from weight bearing leaving her standing on one leg and unaware.  Assisted to sitting.  Patient may benefit from a shorter height wheelchair with increased back support than current chair - spoke with PT and will look for additional chair options this week.  Patient returned to room.  Safety belt in place and engaged and call bell in reach - daughter and visitor at bedside.    Therapy Documentation Precautions:  Precautions Precautions: Fall Precaution Comments: Left hemiparesis Required Braces or Orthoses: Other Brace Other Brace: L Resting hand splint and L PRAFO Restrictions Weight Bearing Restrictions: No   Reports "needles" in left hand and foot.       Therapy/Group: Individual Therapy  Mariah Milling 10/05/2022, 3:15 PM

## 2022-10-05 NOTE — Progress Notes (Signed)
Patient ID: [name], female   DOB: July 03, 1976, 47 y.o.   MRN: [id number]  Met with pt daughter was present along with Aunt/Momma, pt gave worker permission to speak with all of them regarding team conference goal of min assist level and target discharge of 4/8. She hopes it will be shorter but aware has much to do to improve after her stroke. She wants to know if the EMT-Zack will come out and check on her like he did, regarding medications and checking vitals for her CHF. She reports her Education officer, museum was Engineer, civil (consulting) via Heart Failure clinic. Will message and see if still following pt and let know she is a patient here, pt wanted worker to do this for her. When asked if needed to call other daughter pt reports Kyung Rudd can tell her. Aware she will need a ramp for home for the steps at discharge. Will continue to work on discharge needs. Will let her know once hear from [place] regarding visits at home.

## 2022-10-05 NOTE — Patient Care Conference (Signed)
Inpatient RehabilitationTeam Conference and Plan of Care Update Date: 10/05/2022   Time: 11:15 AM    Patient Name: [name]      Medical Record Number: [id number]  Date of Birth: Dec 24, 1975 Sex: Female         Room/Bed: 4M06C/4M06C-01 Payor Info: Payor: MEDICARE / Plan: MEDICARE PART A AND B / Product Type: *No Product type* /    Admit Date/Time:  10/01/2022  1:41 PM  Primary Diagnosis:  Acute ischemic right MCA stroke [facility])  Hospital Problems: Principal Problem:   Acute ischemic right MCA stroke ([place]) Active Problems:   Poorly controlled type 2 diabetes mellitus ([place])   Hyperlipidemia associated with type 2 diabetes mellitus (H[facility])   Chronic nausea   Chronic combined systolic and diastolic heart failure (HCC)   Paroxysmal atrial fibrillation (H[facility])   Severe obesity (BMI >= 40) (H[facility])   Chronic pain   Insomnia   Gaze preference w/left inattention   Left hemiplegia (H[facility])   Mild episode of recurrent major depressive disorder [facility])    Expected Discharge Date: Expected Discharge Date: 10/31/22  Team Members Present: Physician leading conference: Dr. Leeroy Cha Social Worker Present: Ovidio Kin, LCSW Nurse Present: Dorien Chihuahua, RN PT Present: Ginnie Smart, PT OT Present: Other (comment) Antony Salmon, OT) SLP Present: Other (comment) Gunnar Fusi, SLP) PPS Coordinator present : Gunnar Fusi, SLP     Current Status/Progress Goal Weekly Team Focus  Bowel/Bladder      Incontinent of bowel and bladder    Continent of B+B    Toileting  Swallow/Nutrition/ Hydration   D3/Thin - Sup A to Min A   Sup A  diet tolerance monitoring    ADL's   Max/total assist   Min assist   postural control, attention to left, sustained attention    Mobility   maxA bed mobility, maxA squat pivot transfers, max assist for standing. Non ambulatory to date   CGA to minA  Functional transfers, motor planning, L side NMR, pre-gait training    Communication   N/A   N/A    N/A    Safety/Cognition/ Behavioral Observations  Min A   Sup A   attention, recall, awareness, and problem-solving    Pain      N/A          Skin      N/A           Discharge Planning:    SW confirming discharge dispo; daughter's vs parent's home.  Team Discussion: Patient  with HTN, T2 DM on lantus and novolog PTA with poor sustained attention,poor postural control, left inattention and poor sensation left leg post right MCA CVA.   Patient on target to meet rehab goals: yes, currently needs supervision - min assist with D3 - thin liquids. Needs min assist for cognition, problem solving, recall and memory. Currently needs max - total assist for self care. Goals for discharge set for min assist overall and supervision for diet.   *See Care Plan and progress notes for long and short-term goals.   Revisions to Treatment Plan:  N/a   Teaching Needs: Safety, medications, dietary modifications, transfers, toileting, etc.   Current Barriers to Discharge: Decreased caregiver support, Home enviroment access/layout, and Incontinence  Possible Resolutions to Barriers: Family education Ramp for entry to home     Medical Summary Current Status: morbid obesity, refusing CPAP, type 2 diabetes, acute kidney injury, congestive heart failure, back pain, hypertension, decreased left sided sensation, decreased attention to left, impaired  sustained attention  Barriers to Discharge: Medical stability;Morbid Obesity;Renal Insufficiency/Failure;Self-care education;Volume Overload  Barriers to Discharge Comments: morbid obesity, refusing CPAP, type 2 diabetes, acute kidney injury, congestive heart failure, back pain, hypertension, decreased left sided sensation, decreased attention to left, impaired sustained attention Possible Resolutions to [employer]: provide dietary education, encourage CPAP, continue to monitor CBGs and titrate insuline, continue to monitor kidney function  and hold diuretic if needed, continue oxycodone prn, start amantadine '100mg'$  daily   Continued Need for Acute Rehabilitation Level of Care: The patient requires daily medical management by a physician with specialized training in physical medicine and rehabilitation for the following reasons: Direction of a multidisciplinary physical rehabilitation program to maximize functional independence : Yes Medical management of patient stability for increased activity during participation in an intensive rehabilitation regime.: Yes Analysis of laboratory values and/or radiology reports with any subsequent need for medication adjustment and/or medical intervention. : Yes   I attest that I was present, lead the team conference, and concur with the assessment and plan of the team.   Dorien Chihuahua B 10/05/2022, 2:29 PM

## 2022-10-06 ENCOUNTER — Ambulatory Visit: Payer: Medicare Other

## 2022-10-06 LAB — GLUCOSE, CAPILLARY
Glucose-Capillary: 143 mg/dL — ABNORMAL HIGH (ref 70–99)
Glucose-Capillary: 176 mg/dL — ABNORMAL HIGH (ref 70–99)
Glucose-Capillary: 137 mg/dL — ABNORMAL HIGH (ref 70–99)
Glucose-Capillary: 127 mg/dL — ABNORMAL HIGH (ref 70–99)

## 2022-10-06 MED ORDER — AMANTADINE HCL 100 MG PO CAPS
200.0000 mg | ORAL_CAPSULE | Freq: Every day | ORAL | Status: DC
  Administered 2022-10-07 – 2022-11-01 (×26): 200 mg via ORAL
  Filled 2022-10-06 (×27): qty 2

## 2022-10-06 MED ORDER — CALCIUM CARBONATE ANTACID 500 MG PO CHEW
1.0000 | CHEWABLE_TABLET | Freq: Every day | ORAL | Status: DC
  Administered 2022-10-07 – 2022-10-31 (×25): 200 mg via ORAL
  Filled 2022-10-06 (×25): qty 1

## 2022-10-06 NOTE — Progress Notes (Signed)
Speech Language Pathology Daily Session Note  Patient Details  Name: [name] MRN: [id number] Date of Birth: [birth year]  Today's Date: 10/06/2022 SLP Individual Time: 1430-1500 SLP Individual Time Calculation (min): 30 min  Short Term Goals: Week 1: SLP Short Term Goal 1 (Week 1): Pt will improve orientation to person, place, time and situation given mod verbal and visual cues with 100% acc SLP Short Term Goal 2 (Week 1): Pt will clear dys 3 solids from lateral sulcus given min verbal and visual cues with 80% acc SLP Short Term Goal 3 (Week 1): Pt will recall novel, functional information post 3-5 minute delay with 80% acc with mod verbal and visual cues. SLP Short Term Goal 4 (Week 1): Pt will consume thin liquids without s/sx pen/asp given min A in 90% opportunities SLP Short Term Goal 5 (Week 1): Pt will identify solutions to problems within least restrictive environment with 90%acc with min verbal and visual cues.     Skilled Therapeutic Interventions: Pt seen this date for skilled ST intervention targeting cognitive goals outlined above. Pt received lethargic and lying semi-reclined in bed; speaking to family on the phone. Agreeable to intervention at bedside. Affect remains flat.  SLP facilitated today's session by providing education re: compensatory memory strategies, particularly use of external aids to include whiteboard, calendar, and memory notebook - which was provided this date. Pt oriented x 4 with overall Sup A, with the exception of date (pt required Min A for this). Wrote information, in [employer], for morning PT session, and this ST session given overall Min A verbal cues + set-up assistance for organization. Pt very appreciative of notebook, further stating that her decreased recall has impacted her mood recently. Pt did initiate need to use BSC x 1 and was continent of bladder - transferred to [facility] from bed, per safety plan, without issues. Given pt wished for  privacy to finish her phone call, pt missed 15 minutes of skilled ST intervention.   Pt left in bed with all safety measures activated, call bell within reach, and all known immediate needs met. Continue per current ST POC.   Pain No pain reported to this therapist; NAD  Therapy/Group: Individual Therapy  Whitt Auletta A Deyjah Kindel 10/06/2022, 3:03 PM

## 2022-10-06 NOTE — Plan of Care (Signed)
  Problem: RH BOWEL ELIMINATION Goal: RH STG MANAGE BOWEL WITH ASSISTANCE Description: STG Manage Bowel with min Assistance. Outcome: Not Progressing; LBM 3/12

## 2022-10-06 NOTE — Progress Notes (Signed)
Physical Therapy Session Note  Patient Details  Name: LESLI SWARBRICK MRN: [id number] Date of Birth: [birth year]  Today's Date: 10/06/2022 PT Individual Time: [id number] + 1300-1415 PT Individual Time Calculation (min): 25 min  + 75 min  Short Term Goals: Week 1:  PT Short Term Goal 1 (Week 1): Pt will roll side to side w/ min A. PT Short Term Goal 2 (Week 1): Pt will transfer sup to sit w/ mod A PT Short Term Goal 3 (Week 1): Pt will maintain sitting balance x 5' w/ min A PT Short Term Goal 4 (Week 1): PT to assess gait.  Skilled Therapeutic Interventions/Progress Updates:      1st session: Pt greeted in bed - awake and alert and agreeable to PT tx. Pt requesting assistance with toileting, need to void. Supine<>sitting EOB with maxA using hospital bed features - limited active movement of paretic L leg for transition - initiates trunk control but requires external assist to complete. Requires maxA for forward scooting to EOB to achieve feet flat - once with adequate BOS through lower extremities, able to sit EOB with close SBA while unsupported. Completed stand step transfer from EOB to [facility], towards her stronger R side with maxA and +2 on standby for bracing BSC for safety. Assist needed for balance while lowering briefs. Pt continent of bladder void - charted. Returned to EOB towards her involved L side with maxA, knee buckling on L with weight shifts, and difficulty bringing hips posteriorly due to body habitus and weakness. TotalA requires for transitioning to supine and +2 assist for scooting to [facility]. Positioned with alarm on, call bell in lap, all needs met.    2nd session: Pt sitting in w/c to start - in agreement to therapy session. Continues to demonstrate flat affect - worked on Adult nurse and improving mood throughout session.   Transported to main rehab gym for time management. Retrieved 20x18 hemi height wheelchair with firm back for improved support due to patient's impaired  postural control and body habitus/size.  Squat<>pivot transfer towards her stronger R side with maxA from EOB to mat table - improved initiation and problem solving for sequencing task. Continues to require assist for posterior scooting on mat table - task made therapeutic to work on reciprocal scooting.   Static sitting balance unsupported with close SBA - continues to demonstrate thoracic shift with pelvic obliquity sitting - used mirror to assist with visual feedback.   Focused remainder of session on NMR for sit<>stands, pre-gait training, forced weight shifting to her L, and forced weight bearing on her L. Patient is getting quick to learn compensatory strategies, especially with her legs - worked on symmetrical movement patterns and normal movement patterns for equal weight bearing. Min/modA overall for sit<>stands with PT caging L knee - mod to maxA for standing balance for pre-gait training while stepping towards target.   Patient requesting to return to bed at end of session- assisted back to bed with maxA squat<>pivot transfer towards involved L side - over the back technique to promote forward weight shifting and assist for placing feet for adequate BOS. MaxA for sit>supine and +2 assist for repositioning in bed. All needs met, alarm on, with call bell in lap.   Therapy Documentation Precautions:  Precautions Precautions: Fall Precaution Comments: Left hemiparesis Required Braces or Orthoses: Other Brace Other Brace: L Resting hand splint and L PRAFO Restrictions Weight Bearing Restrictions: No General:   Therapy/Group: Individual Therapy  [name] 10/06/2022, 7:37 AM

## 2022-10-06 NOTE — Progress Notes (Signed)
PROGRESS NOTE   Subjective/Complaints: CBGs 143-171 No new complaints this morning, sleepy Amantadine started today to help improve initiation  ROS: denies pain at rest, but has back and neck pain after therapy, +decreased initiation as per therapy.    Objective:   No results found. No results for input(s): "WBC", "HGB", "HCT", "PLT" in the last 72 hours.  Recent Labs    10/04/22 0529 10/05/22 0556  NA 137 137  K 4.1 4.0  CL 94* 98  CO2 31 29  GLUCOSE 127* 126*  BUN 19 14  CREATININE 1.40* 1.25*  CALCIUM 8.9 8.7*    Intake/Output Summary (Last 24 hours) at 10/06/2022 1947 Last data filed at 10/06/2022 1810 Gross per 24 hour  Intake 720 ml  Output 800 ml  Net -80 ml        Physical Exam: Vital Signs Blood pressure (!) 109/91, pulse 73, temperature 98.4 F (36.9 C), temperature source Oral, resp. rate 17, height '4\' 11"'$  (1.499 m), weight 80.3 kg, last menstrual period 06/22/2022, SpO2 100 %. Constitutional:      General: She is not in acute distress.    Appearance: She is obese. BMI 37.31    Comments: Kept eyes turned to the right but able to turn to midline and left with minimal cues.   HENT:     Head: Normocephalic.     Mouth/Throat:     Mouth: Mucous membranes are moist.     Comments: Tongue with white coating Eyes:     Conjunctiva/sclera: Conjunctivae normal.     Pupils: Pupils are equal, round, and reactive to light.  Cardiovascular:     Rate and Rhythm: Normal rate and regular rhythm.  Pulmonary:     Effort: Pulmonary effort is normal. No respiratory distress.     Breath sounds: Normal breath sounds. No wheezing.  Abdominal:     General: Bowel sounds are normal.     Palpations: Abdomen is soft.  Musculoskeletal:        General: No swelling or tenderness.  Skin:    General: Skin is warm and dry.  Neurological:     Mental Status: She is alert.     Comments: Pt appears lethargic but awakens  and responds to questions. Decreased initiation. Speech dysarthric, low volume. Language appears to be intact. Right gaze preference but can follow to left to a lesser extent. No obvious field cut. Left central 7 and tongue deviation. LUE and LLE 0/5. Senses pain and LT in all 4's. No resting tone. DTR's 1+ throughout. Toes down left foot, no clonus.  Max A rolling Psychiatric:     Comments: Flat, depressed, decreased attention   Assessment/Plan: 1. Functional deficits which require 3+ hours per day of interdisciplinary therapy in a comprehensive inpatient rehab setting. Physiatrist is providing close team supervision and 24 hour management of active medical problems listed below. Physiatrist and rehab team continue to assess barriers to discharge/monitor patient progress toward functional and medical goals  Care Tool:  Bathing    Body parts bathed by patient: Chest, Abdomen, Front perineal area, Face   Body parts bathed by helper: Right arm, Left arm, Buttocks, Right upper leg, Right lower leg,  Left upper leg, Left lower leg     Bathing assist Assist Level: Total Assistance - Patient < 25%     Upper Body Dressing/Undressing Upper body dressing   What is the patient wearing?: Pull over shirt    Upper body assist Assist Level: Maximal Assistance - Patient 25 - 49%    Lower Body Dressing/Undressing Lower body dressing      What is the patient wearing?: Pants     Lower body assist Assist for lower body dressing: Total Assistance - Patient < 25%     Toileting Toileting    Toileting assist Assist for toileting: Dependent - Patient 0%     Transfers Chair/bed transfer  Transfers assist  Chair/bed transfer activity did not occur: Safety/medical concerns  Chair/bed transfer assist level: 2 Helpers (MAXX +2 squat pivot)     Locomotion Ambulation   Ambulation assist   Ambulation activity did not occur: Safety/medical concerns          Walk 10 feet  activity   Assist  Walk 10 feet activity did not occur: Safety/medical concerns        Walk 50 feet activity   Assist Walk 50 feet with 2 turns activity did not occur: Safety/medical concerns         Walk 150 feet activity   Assist Walk 150 feet activity did not occur: Safety/medical concerns         Walk 10 feet on uneven surface  activity   Assist Walk 10 feet on uneven surfaces activity did not occur: Safety/medical concerns         Wheelchair     Assist Is the patient using a wheelchair?: Yes Type of Wheelchair: Manual Wheelchair activity did not occur: Safety/medical concerns         Wheelchair 50 feet with 2 turns activity    Assist    Wheelchair 50 feet with 2 turns activity did not occur: Safety/medical concerns       Wheelchair 150 feet activity     Assist  Wheelchair 150 feet activity did not occur: Safety/medical concerns       Blood pressure (!) 109/91, pulse 73, temperature 98.4 F (36.9 C), temperature source Oral, resp. rate 17, height '4\' 11"'$  (1.499 m), weight 80.3 kg, last menstrual period 06/22/2022, SpO2 100 %.    Medical Problem List and Plan: 1. 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  Antithrombotics: -DVT/anticoagulation:  Pharmaceutical: Eliquis             -antiplatelet therapy: N/A 3.Back pain/Pain Management:  Tylenol prn. Oxycodone making her nausea worse and ineffective--?? 4. Mood/Behavior/Sleep: LCSW to follow for evaluation and support.              -antipsychotic agents: N/A             -pt reports chronic anxiety at baseline. Expresses that she's depressed as well currently.              -took atarax at home tid prn. Will resume.             -begin lexapro '5mg'$  qhs             -  neuropsych eval 5.  Neuropsych/cognition: This patient is not capable of making decisions on her own behalf. 6. Skin/Wound Care:  Routine pressure relief  measures.  7. Fluids/Electrolytes/Nutrition: Monitor I/O. Check CMET on 03/11.  8. Acute on chronic combined CHF: HH diet.  -Monitor weights daily and for signs of overload.             --continue Demadex, Cozaar, Spironolactone, Jardiance and Lopressor  Weight reviewed 3/11 and is down to 85.2 kg 9. T2DM: Hgb A1C-10.8  (improving >15 @ 11/23). Was on Lantus 15u w/ novolog 12 units with supper             --monitor BS ac/hs and use SSI for elevated BS             --on SSI   -magnesium level reviewed and is normal.   -increase Jardiance to '25mg'$  10. HTN: potassium and magnesium reviewed and normal. Continue Spironolactone.  11. Severe obesity: Educate patient on diet, compliance and exercise to help promote health and mobility. Magnesium reviewed and normal. Vitamin D supplement started 12. AKI: Diuretic held. Cr improved 3/13. Monitor weekly.  13. Decreased initiation: increase amantadine to '200mg'$  daily.  14. Vitamin D insufficiency: continue ergocalciferol 50,000U once per week for 7 weeks   LOS: 5 days A FACE TO FACE EVALUATION WAS PERFORMED  Clide Deutscher Ra Pfiester 10/06/2022, 7:47 PM

## 2022-10-06 NOTE — Progress Notes (Signed)
Occupational Therapy Session Note  Patient Details  Name: [name] MRN: [id number] Date of Birth: Nov 06, 1975  Today's Date: 10/06/2022 OT Individual Time: [id number] Individual Time Calculation (min): 48 min    Short Term Goals: Week 1:  OT Short Term Goal 1 (Week 1): Pt will complete STS transfer with Mod A + LRAD, in preparation for ADL participation. OT Short Term Goal 2 (Week 1): Pt will complete squat-pivot transfer from EOB<>BSC with Mod A + LRAD. OT Short Term Goal 3 (Week 1): Pt will complete LB ADLs with Max A + LRAD.  Skilled Therapeutic Interventions/Progress Updates:  Pt greeted asleep in supine with family present. Pt needed MAX multimodal cues to arouse with environmental cues provided. Pt reports need to void bladder. Pt completed supine>sit with MAXA making no efforts to assist with scooting hips to EOB. D/t urgency utilized stedy to get to [facility]. Pt able to stand to stedy with MOD A +2, increased assist d/t lethargy. Pt able to continently void bladder. Pt required total A for all aspects of toileting while standing in stedy.   Pt completed dressing from sitting on BSC MAX A for all aspects of dressing from [facility]. Utilized stedy to transition to w/c. Pt continues to report frustration that she feels like she isn't do as good as yesterday however feel this is d/t lethargy.   Pt transported to gym with total A where pt able to complete squat pivot to R side with MAX A +2, pt with poor initiation today and impaired body awareness during transfer. Once EOM pt able to static sit with CGA. Worked on sit>stands from [facility] with an emphasis on NMR. Pt able to complete x2 stands with MODA +2, utilized mirror for visual feedback to assist with body awareness. Pt with L lateral lean in standing needing manual facilitation at hips to correct.   Ended session with pt seated in w/c with alarm belt activated and all needs within reach.   Therapy Documentation Precautions:   Precautions Precautions: Fall Precaution Comments: Left hemiparesis Required Braces or Orthoses: Other Brace Other Brace: L Resting hand splint and L PRAFO Restrictions Weight Bearing Restrictions: No  Pain:no pain reported     Therapy/Group: Individual Therapy  Corinne Ports [facility] 10/06/2022, 12:21 PM

## 2022-10-07 ENCOUNTER — Inpatient Hospital Stay (HOSPITAL_COMMUNITY): Payer: Medicare Other

## 2022-10-07 LAB — GLUCOSE, CAPILLARY
Glucose-Capillary: 114 mg/dL — ABNORMAL HIGH (ref 70–99)
Glucose-Capillary: 157 mg/dL — ABNORMAL HIGH (ref 70–99)
Glucose-Capillary: 145 mg/dL — ABNORMAL HIGH (ref 70–99)
Glucose-Capillary: 178 mg/dL — ABNORMAL HIGH (ref 70–99)

## 2022-10-07 MED ORDER — TORSEMIDE 20 MG PO TABS
20.0000 mg | ORAL_TABLET | Freq: Every day | ORAL | Status: DC
  Administered 2022-10-08 – 2022-10-10 (×3): 20 mg via ORAL
  Filled 2022-10-07 (×3): qty 1

## 2022-10-07 MED ORDER — CYANOCOBALAMIN 1000 MCG/ML IJ SOLN
1000.0000 ug | Freq: Once | INTRAMUSCULAR | Status: AC
  Administered 2022-10-07: 1000 ug via INTRAMUSCULAR
  Filled 2022-10-07: qty 1

## 2022-10-07 NOTE — Progress Notes (Signed)
Occupational Therapy Session Note  Patient Details  Name: [name] MRN: [id number] Date of Birth: [birth year]  Today's Date: 10/07/2022 OT Individual Time: 1102-1202 OT Individual Time Calculation (min): 60 min    Short Term Goals: Week 1:  OT Short Term Goal 1 (Week 1): Pt will complete STS transfer with Mod A + LRAD, in preparation for ADL participation. OT Short Term Goal 2 (Week 1): Pt will complete squat-pivot transfer from EOB<>BSC with Mod A + LRAD. OT Short Term Goal 3 (Week 1): Pt will complete LB ADLs with Max A + LRAD.  Skilled Therapeutic Interventions/Progress Updates:    Patient received supine in bed.  Patient agreeable to OT session.  Patient assisted to sitting at edge of bed.  No volitional movement noted in LLE. Patient with long delays in motoric response.  Sitting balance (static) significantly improving.  Patient able to sit at edge of bed with stand by assist.  Able to complete sponge bathing and dressing edge of bed with max assist.  Able to transition to standing with mod assist and use R hand to assist with pulling up pants.  Patient transferred level surface toward left with multisquat technique and min assist.  Transported to gym and completed slightly uphill transfer toward right side - with max assist.  Total assist to scoot back onto mat table.  Worked on dynamic sitting balance, attention to left, and neuromuscular reeducation to LUE.  Transported back to room, left up in wheelchair for lunch.  Safety belt in place and engaged - mom at bedside - call bell in lap.    Therapy Documentation Precautions:  Precautions Precautions: Fall Precaution Comments: Left hemiparesis Required Braces or Orthoses: Other Brace Other Brace: L Resting hand splint and L PRAFO Restrictions Weight Bearing Restrictions: No   Pain:  No report of pain - reports numbness in left foot - hypersensitive    Therapy/Group: Individual Therapy  Mariah Milling 10/07/2022, 12:11  PM

## 2022-10-07 NOTE — Progress Notes (Signed)
Physical Therapy Session Note  Patient Details  Name: [name] MRN: [id number] Date of Birth: 1976/06/21  Today's Date: 10/07/2022 PT Individual Time: 1310-1359 PT Individual Time Calculation (min): 49 min   Short Term Goals: Week 1:  PT Short Term Goal 1 (Week 1): Pt will roll side to side w/ min A. PT Short Term Goal 2 (Week 1): Pt will transfer sup to sit w/ mod A PT Short Term Goal 3 (Week 1): Pt will maintain sitting balance x 5' w/ min A PT Short Term Goal 4 (Week 1): PT to assess gait.  Skilled Therapeutic Interventions/Progress Updates:    Pt presents in room in [facility] with family present, pt eating lunch due to family reporting lunch arrived late and requesting increased time to complete lunch. PT reentered room at 1310 with pt agreeable to PT. Pt transported via [place] to main gym dependently. Pt completes squat pivot transfers to R and L with max assist x1 and min assist x1, poor sequencing noted for maintaining squat however responds well to verbal cueing. Pt completes sit<>stand EOM with modA with L knee block and 2nd person SBA, completes NMR with visual cues for postural stability in standing with visual cue of mirror, requires verbal cueing for correction of postural stability with pt continue to demonstrate L lateral lean requiring mod assist and L knee block and 2nd person min assist for HHA. 1st trial pt maintains standing for ~39min. 2nd trial pt maintains standing ~75min with therapist seated on stool to L to block L knee. Pt requires therapeutic rest break between standing trials due to fatigue and to maximize endurance for task. Pt requests to use restroom during session and returns to room in [facility]. Completes sit>stand to Clarise Cruz stedy with min assist x2 with 2nd person assist for management of LUE. Pt maintains standing in [place] stedy to roll into bathroom, maintains balance with perturbations with RUE and verbal cueing for midline alignment. Pt requires mod assist x2 for stand to sit on  toilet with BSC. Pt was continent of urine which was charted. Pt requires max assist for donning/doffing pants and periarea hygiene. Pt transfers back to [facility] via sara stedy assistx2 and transported back to therapy gym. Pt continues to complete NMR seated EOM with feet supported on step to promote core strengthening, sitting balance, postural alignment and midline alignment in sitting with visual cue of mirror for feedback. Pt completes x5 modified sit ups from modified reclined position to short sitting, forward reach outside BOS with emphasis on crossing midline with RUE and returning to midline with use of squig on mirror. Pt handoff to OT at end of session at [facility].  Therapy Documentation Precautions:  Precautions Precautions: Fall Precaution Comments: Left hemiparesis Required Braces or Orthoses: Other Brace Other Brace: L Resting hand splint and L PRAFO Restrictions Weight Bearing Restrictions: No General: PT Amount of Missed Time (min): 11 Minutes PT Missed Treatment Reason: Other (Comment) (Pt requesting to finish lunch)   Pain: Pt reports pain in LLE at rest, states has not received doppler at this time. Pt does not rate NPRS. Denies pain at end of session.   Therapy/Group: Individual Therapy  Lorna Dibble 10/07/2022, 2:07 PM

## 2022-10-07 NOTE — Progress Notes (Addendum)
PROGRESS NOTE   Subjective/Complaints: Complaining of throbbing in bilateral lower extremities- vascular ultrasound ordered to r/o clots, discussed that if this is stable, we can try SCDs for her  ROS: + back and neck pain after therapy, +decreased initiation as per therapy. +throbbing in b/l lower extremities   Objective:   No results found. No results for input(s): "WBC", "HGB", "HCT", "PLT" in the last 72 hours.  Recent Labs    10/05/22 0556  NA 137  K 4.0  CL 98  CO2 29  GLUCOSE 126*  BUN 14  CREATININE 1.25*  CALCIUM 8.7*    Intake/Output Summary (Last 24 hours) at [name] 0948 Last data filed at [name] 0946 Gross per 24 hour  Intake 840 ml  Output 980 ml  Net -140 ml        Physical Exam: Vital Signs Blood pressure 105/79, pulse 73, temperature 98.9 F (37.2 C), temperature source Oral, resp. rate 20, height 4\' 11"  (1.499 m), weight (S) 75.9 kg, last menstrual period 06/22/2022, SpO2 98 %. Constitutional:      General: She is not in acute distress.    Appearance: She is obese. BMI 33.80    Comments: Kept eyes turned to the right but able to turn to midline and left with minimal cues.   HENT:     Head: Normocephalic.     Mouth/Throat:     Mouth: Mucous membranes are moist.     Comments: Tongue with white coating Eyes:     Conjunctiva/sclera: Conjunctivae normal.     Pupils: Pupils are equal, round, and reactive to light.  Cardiovascular:     Rate and Rhythm: Normal rate and regular rhythm.  Pulmonary:     Effort: Pulmonary effort is normal. No respiratory distress.     Breath sounds: Normal breath sounds. No wheezing.  Abdominal:     General: Bowel sounds are normal.     Palpations: Abdomen is soft.  Musculoskeletal:        General: No swelling or tenderness.  Skin:    General: Skin is warm and dry.  Neurological:     Mental Status: She is alert.     Comments: Pt appears lethargic but  awakens and responds to questions. Decreased initiation. Speech dysarthric, low volume. Language appears to be intact. Right gaze preference but can follow to left to a lesser extent. No obvious field cut. Left central 7 and tongue deviation. LUE and LLE 0/5. Senses pain and LT in all 4's. No resting tone. DTR's 1+ throughout. Toes down left foot, no clonus.  Max A rolling Psychiatric:     Comments: Flat, depressed, decreased attention   Assessment/Plan: 1. Functional deficits which require 3+ hours per day of interdisciplinary therapy in a comprehensive inpatient rehab setting. Physiatrist is providing close team supervision and 24 hour management of active medical problems listed below. Physiatrist and rehab team continue to assess barriers to discharge/monitor patient progress toward functional and medical goals  Care Tool:  Bathing    Body parts bathed by patient: Chest, Abdomen, Front perineal area, Face   Body parts bathed by helper: Right arm, Left arm, Buttocks, Right upper leg, Right lower leg, Left  upper leg, Left lower leg     Bathing assist Assist Level: Total Assistance - Patient < 25%     Upper Body Dressing/Undressing Upper body dressing   What is the patient wearing?: Pull over shirt    Upper body assist Assist Level: Maximal Assistance - Patient 25 - 49%    Lower Body Dressing/Undressing Lower body dressing      What is the patient wearing?: Pants     Lower body assist Assist for lower body dressing: Total Assistance - Patient < 25%     Toileting Toileting    Toileting assist Assist for toileting: Dependent - Patient 0%     Transfers Chair/bed transfer  Transfers assist  Chair/bed transfer activity did not occur: Safety/medical concerns  Chair/bed transfer assist level: 2 Helpers (MAXX +2 squat pivot)     Locomotion Ambulation   Ambulation assist   Ambulation activity did not occur: Safety/medical concerns          Walk 10 feet  activity   Assist  Walk 10 feet activity did not occur: Safety/medical concerns        Walk 50 feet activity   Assist Walk 50 feet with 2 turns activity did not occur: Safety/medical concerns         Walk 150 feet activity   Assist Walk 150 feet activity did not occur: Safety/medical concerns         Walk 10 feet on uneven surface  activity   Assist Walk 10 feet on uneven surfaces activity did not occur: Safety/medical concerns         Wheelchair     Assist Is the patient using a wheelchair?: Yes Type of Wheelchair: Manual Wheelchair activity did not occur: Safety/medical concerns         Wheelchair 50 feet with 2 turns activity    Assist    Wheelchair 50 feet with 2 turns activity did not occur: Safety/medical concerns       Wheelchair 150 feet activity     Assist  Wheelchair 150 feet activity did not occur: Safety/medical concerns       Blood pressure 105/79, pulse 73, temperature 98.9 F (37.2 C), temperature source Oral, resp. rate 20, height 4\' 11"  (1.499 m), weight (S) 75.9 kg, last menstrual period 06/22/2022, SpO2 98 %.    Medical Problem List and Plan: 1. 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  Antithrombotics: -DVT/anticoagulation:  Pharmaceutical: Eliquis             -antiplatelet therapy: N/A 3.Back pain/Pain Management:  Tylenol prn. Oxycodone making her nausea worse and ineffective--?? 4. Mood/Behavior/Sleep: LCSW to follow for evaluation and support.              -antipsychotic agents: N/A             -pt reports chronic anxiety at baseline. Expresses that she's depressed as well currently.              -took atarax at home tid prn. Will resume.             -begin lexapro 5mg  qhs             -  neuropsych eval 5.  Neuropsych/cognition: This patient is not capable of making decisions on her own behalf. 6. Skin/Wound Care:  Routine pressure relief  measures.  7. Fluids/Electrolytes/Nutrition: Monitor I/O. Check CMET on 03/11.  8. Acute on chronic combined CHF: HH diet.  -Monitor weights daily and for signs of overload.             --continue Demadex, Cozaar, Spironolactone, Jardiance and Lopressor  Weight reviewed 3/15 and is significantly downtrending, asked RN to reweigh and discussed with RN what patient has been eating, decrease torsemide to 10mg . Monitor weights daily.  9. T2DM: Hgb A1C-10.8  (improving >15 @ 11/23). Was on Lantus 15u w/ novolog 12 units with supper             --monitor BS ac/hs and use SSI for elevated BS             --on SSI   -magnesium level reviewed and is normal.   -increase Jardiance to 25mg  10. HTN: potassium and magnesium reviewed and normal. Continue Spironolactone.  11. Severe obesity: Educate patient on diet, compliance and exercise to help promote health and mobility. Magnesium reviewed and normal. Vitamin D supplement started 12. AKI: Diuretic held. Cr improved 3/13. Monitor weekly. Discussed with patient.  13. Decreased initiation: increase amantadine to 200mg  daily.  14. Vitamin D insufficiency: continue ergocalciferol 50,000U once per week for 7 weeks 15. Bilateral lower extremity throbbing: vascular ultrasound ordered, if negative, discussed we can order SCDs for her  LOS: 6 days A FACE TO FACE EVALUATION WAS PERFORMED  [name] [name], [name]

## 2022-10-07 NOTE — Progress Notes (Signed)
Occupational Therapy Session Note  Patient Details  Name: MORAYMA GOMOLKA MRN: [id number] Date of Birth: 1976-05-01  Today's Date: 10/07/2022 OT Individual Time: 1400-1500 OT Individual Time Calculation (min): 60 min    Short Term Goals: Week 1:  OT Short Term Goal 1 (Week 1): Pt will complete STS transfer with Mod A + LRAD, in preparation for ADL participation. OT Short Term Goal 2 (Week 1): Pt will complete squat-pivot transfer from EOB<>BSC with Mod A + LRAD. OT Short Term Goal 3 (Week 1): Pt will complete LB ADLs with Max A + LRAD.  Skilled Therapeutic Interventions/Progress Updates:    Patient received seated on mat table in gym.  Patient able to verbalize that she was working on balancing herself in sitting.  Worked on scooting, weight shifting laterally to allow scooting.  Patient has difficulty with sufficient controlled right weight shift and activation for posterior rotation for scooting.  Continued work on level surface squat pivot transfers.  Patient confuses left/right - does better with gestural directional cues.  Worked on sit to stand at bars to change out wheelchair cushion to hopefully improve sitting mobility and transfers.   Worked on visual scanning - with auditory cues. Patient with significant left field cut - however able to effectively use auditory cue to locate items left of midline.   Patient assisted to bathroom for continent void, then back to bed per her request.  Patient visibly fatigued at end of back to back therapy sessions.  Bed alarm engaged and call bell/ personal items in reach.    Therapy Documentation Precautions:  Precautions Precautions: Fall Precaution Comments: Left hemiparesis Required Braces or Orthoses: Other Brace Other Brace: L Resting hand splint and L PRAFO Restrictions Weight Bearing Restrictions: No General: General PT Missed Treatment Reason: Other (Comment) (Pt requesting to finish lunch)   Pain:  Denies pain        Therapy/Group: Individual Therapy  Mariah Milling 10/07/2022, 3:10 PM

## 2022-10-08 ENCOUNTER — Inpatient Hospital Stay (HOSPITAL_COMMUNITY): Payer: Medicare Other

## 2022-10-08 DIAGNOSIS — R52 Pain, unspecified: Secondary | ICD-10-CM

## 2022-10-08 DIAGNOSIS — K5901 Slow transit constipation: Secondary | ICD-10-CM

## 2022-10-08 DIAGNOSIS — M7989 Other specified soft tissue disorders: Secondary | ICD-10-CM

## 2022-10-08 LAB — GLUCOSE, CAPILLARY
Glucose-Capillary: 132 mg/dL — ABNORMAL HIGH (ref 70–99)
Glucose-Capillary: 155 mg/dL — ABNORMAL HIGH (ref 70–99)
Glucose-Capillary: 189 mg/dL — ABNORMAL HIGH (ref 70–99)
Glucose-Capillary: 132 mg/dL — ABNORMAL HIGH (ref 70–99)

## 2022-10-08 MED ORDER — DOCUSATE SODIUM 100 MG PO CAPS
100.0000 mg | ORAL_CAPSULE | Freq: Every day | ORAL | Status: DC
  Administered 2022-10-08 – 2022-10-15 (×8): 100 mg via ORAL
  Filled 2022-10-08 (×8): qty 1

## 2022-10-08 MED ORDER — POLYETHYLENE GLYCOL 3350 17 G PO PACK
17.0000 g | PACK | Freq: Two times a day (BID) | ORAL | Status: DC
  Administered 2022-10-08 – 2022-10-19 (×10): 17 g via ORAL
  Filled 2022-10-08 (×21): qty 1

## 2022-10-08 MED ORDER — MELATONIN 5 MG PO TABS
5.0000 mg | ORAL_TABLET | Freq: Every day | ORAL | Status: DC
  Administered 2022-10-08 – 2022-10-31 (×24): 5 mg via ORAL
  Filled 2022-10-08 (×24): qty 1

## 2022-10-08 NOTE — Progress Notes (Signed)
PROGRESS NOTE   Subjective/Complaints: Doing well overnight, not having pain. Slept poorly because of interruptions. LBM was several days ago (10/02/22 per documentation). Urinating ok, having some functional incontinence but can feel the urge-- just needs assistance still.  Otherwise no complaints.   ROS: + back and neck pain-improved, +decreased initiation as per therapy. +throbbing in b/l lower extremities-improving. +Constipation. Denies CP, SOB, abd pain, n/v/d.    Objective:   VAS [place] LOWER EXTREMITY VENOUS (DVT)  Result Date: 10/08/2022  Lower Venous DVT Study Patient Name:  [name]  Date of Exam:   10/08/2022 Medical Rec #: [id number]        Accession #:    [id number] Date of Birth: 1976/02/01         Patient Gender: F Patient Age:   47 years Exam Location:  [place] Procedure:      VAS [place] LOWER EXTREMITY VENOUS (DVT) Referring Phys: Leeroy Cha --------------------------------------------------------------------------------  Indications: Pain, and Swelling.  Anticoagulation: Eliquis. Comparison Study: No prior study. Performing Technologist: McKayla Maag RVT, [place]  Examination Guidelines: A complete evaluation includes B-mode imaging, spectral Doppler, color Doppler, and power Doppler as needed of all accessible portions of each vessel. Bilateral testing is considered an integral part of a complete examination. Limited examinations for reoccurring indications may be performed as noted. The reflux portion of the exam is performed with the patient in reverse Trendelenburg.  +---------+---------------+---------+-----------+----------+-------------------+ RIGHT    CompressibilityPhasicitySpontaneityPropertiesThrombus Aging      +---------+---------------+---------+-----------+----------+-------------------+ CFV      Full           Yes      Yes                                       +---------+---------------+---------+-----------+----------+-------------------+ SFJ      Full                                                             +---------+---------------+---------+-----------+----------+-------------------+ FV Prox  Full                                                             +---------+---------------+---------+-----------+----------+-------------------+ FV Mid   Full                                                             +---------+---------------+---------+-----------+----------+-------------------+ FV DistalFull                                                             +---------+---------------+---------+-----------+----------+-------------------+  PFV      Full                                                             +---------+---------------+---------+-----------+----------+-------------------+ POP      Full           Yes      Yes                                      +---------+---------------+---------+-----------+----------+-------------------+ PTV                     Yes      Yes                  Not well visualized +---------+---------------+---------+-----------+----------+-------------------+ PERO                    Yes      Yes                  Not well visualized +---------+---------------+---------+-----------+----------+-------------------+   +---------+---------------+---------+-----------+----------+-------------------+ LEFT     CompressibilityPhasicitySpontaneityPropertiesThrombus Aging      +---------+---------------+---------+-----------+----------+-------------------+ CFV      Full           Yes      Yes                                      +---------+---------------+---------+-----------+----------+-------------------+ SFJ      Full                                                             +---------+---------------+---------+-----------+----------+-------------------+ FV  Prox  Full                                                             +---------+---------------+---------+-----------+----------+-------------------+ FV Mid   Full                                                             +---------+---------------+---------+-----------+----------+-------------------+ FV DistalFull                                                             +---------+---------------+---------+-----------+----------+-------------------+ PFV      Full                                                             +---------+---------------+---------+-----------+----------+-------------------+  POP      Full           Yes      Yes                                      +---------+---------------+---------+-----------+----------+-------------------+ PTV                     Yes      Yes                  Not well visualized +---------+---------------+---------+-----------+----------+-------------------+ PERO                    Yes      Yes                  Not well visualized +---------+---------------+---------+-----------+----------+-------------------+    Summary: RIGHT: - There is no evidence of deep vein thrombosis in the lower extremity. However, portions of this examination were limited- see technologist comments above.  - No cystic structure found in the popliteal fossa.  LEFT: - There is no evidence of deep vein thrombosis in the lower extremity. However, portions of this examination were limited- see technologist comments above.  - No cystic structure found in the popliteal fossa.  *See table(s) above for measurements and observations.    Preliminary    No results for input(s): "WBC", "HGB", "HCT", "PLT" in the last 72 hours.  No results for input(s): "NA", "K", "CL", "CO2", "GLUCOSE", "BUN", "CREATININE", "CALCIUM" in the last 72 hours.   Intake/Output Summary (Last 24 hours) at 10/08/2022 1151 Last data filed at 10/07/2022 2250 Gross per 24  hour  Intake 360 ml  Output 725 ml  Net -365 ml        Physical Exam: Vital Signs Blood pressure (!) 114/91, pulse 71, temperature 98.3 F (36.8 C), temperature source Oral, resp. rate 14, height 4\' 11"  (1.499 m), weight (S) 73.9 kg, last menstrual period 06/22/2022, SpO2 100 %.  Constitutional: No distress . Vital signs reviewed. Obese.  HEENT: NCAT, EOMI, oral membranes moist.  Neck: supple Cardiovascular: RRR without murmur. No JVD    Respiratory/Chest: CTA Bilaterally without wheezes or rales. Normal effort    GI/Abdomen: BS +, non-tender, non-distended Ext: no clubbing or cyanosis, 1-2+ BLE edema Psych: flat, depressed, but appreciative of assistance Skin: dry, warm MSK: weak in L arm, able to move R arm around in bed, full MsK exam not performed.  Neurologic: Pt less tired than before. Speech dysarthric, low volume. Language appears to be intact.    PRIOR EXAM: Neurologic: Right gaze preference but can follow to left to a lesser extent. No obvious field cut. Left central 7 and tongue deviation. LUE and LLE 0/5. Senses pain and LT in all 4's. No resting tone. DTR's 1+ throughout. Toes down left foot, no clonus.    Assessment/Plan: 1. Functional deficits which require 3+ hours per day of interdisciplinary therapy in a comprehensive inpatient rehab setting. Physiatrist is providing close team supervision and 24 hour management of active medical problems listed below. Physiatrist and rehab team continue to assess barriers to discharge/monitor patient progress toward functional and medical goals  Care Tool:  Bathing    Body parts bathed by patient: Chest, Abdomen, Front perineal area, Face   Body parts bathed by helper: Right arm, Left arm, Buttocks, Right upper leg, Right lower leg, Left upper leg,  Left lower leg     Bathing assist Assist Level: Total Assistance - Patient < 25%     Upper Body Dressing/Undressing Upper body dressing   What is the patient wearing?: Pull  over shirt    Upper body assist Assist Level: Maximal Assistance - Patient 25 - 49%    Lower Body Dressing/Undressing Lower body dressing      What is the patient wearing?: Pants     Lower body assist Assist for lower body dressing: Total Assistance - Patient < 25%     Toileting Toileting    Toileting assist Assist for toileting: 2 Helpers     Transfers Chair/bed transfer  Transfers assist  Chair/bed transfer activity did not occur: Safety/medical concerns  Chair/bed transfer assist level: 2 Helpers     Locomotion Ambulation   Ambulation assist   Ambulation activity did not occur: Safety/medical concerns          Walk 10 feet activity   Assist  Walk 10 feet activity did not occur: Safety/medical concerns        Walk 50 feet activity   Assist Walk 50 feet with 2 turns activity did not occur: Safety/medical concerns         Walk 150 feet activity   Assist Walk 150 feet activity did not occur: Safety/medical concerns         Walk 10 feet on uneven surface  activity   Assist Walk 10 feet on uneven surfaces activity did not occur: Safety/medical concerns         Wheelchair     Assist Is the patient using a wheelchair?: Yes Type of Wheelchair: Manual Wheelchair activity did not occur: Safety/medical concerns         Wheelchair 50 feet with 2 turns activity    Assist    Wheelchair 50 feet with 2 turns activity did not occur: Safety/medical concerns       Wheelchair 150 feet activity     Assist  Wheelchair 150 feet activity did not occur: Safety/medical concerns       Blood pressure (!) 114/91, pulse 71, temperature 98.3 F (36.8 C), temperature source Oral, resp. rate 14, height 4\' 11"  (1.499 m), weight (S) 73.9 kg, last menstrual period 06/22/2022, SpO2 100 %.    Medical Problem List and Plan: 1. 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  Antithrombotics: -DVT/anticoagulation:  Pharmaceutical: Eliquis 5mg  BID             -antiplatelet therapy: N/A 3.Back pain/Pain Management:  Tylenol prn. Oxycodone making her nausea worse and ineffective--changed to MSIR 15mg  q6h PRN prior to d/c from acute, reordered here  4. Mood/Behavior/Sleep: LCSW to follow for evaluation and support.              -antipsychotic agents: N/A -pt reports chronic anxiety at baseline. Expresses that she's depressed as well currently.              -took atarax at home 10mg  tid prn. Will resume.             -begin lexapro 5mg  qhs             -  neuropsych eval -10/02/22 started Trazodone 25mg  QHS and Hydroxyzine 25mg  QHS for now, monitor for effect or oversedation; left Hydroxyzine 10mg  TID PRN dosing for anxiety in place  -10/08/22 not sleeping well; added melatonin 5mg   5. Neuropsych/cognition: This patient is not capable of making decisions on her own behalf. 6. Skin/Wound Care:  Routine pressure relief  measures.  7. Fluids/Electrolytes/Nutrition: Monitor I/O. Monitor routine labs weekly, next 10/10/22 8. Acute on chronic combined CHF: HH diet.  -Monitor weights daily and for signs of overload. -continue Torsemide 40mg  QD (>>10mg  QD), Losartan 50mg  QD, Spironolactone 25mg  QD, Jardiance 10mg  QD (>>25mg  QD), and Metoprolol 25mg  BID  -Weight reviewed 3/15 and is significantly downtrending, asked RN to reweigh and discussed with RN what patient has been eating, decrease torsemide to 10mg . Monitor weights daily.  -10/08/22 wt fairly stable and BP stable, but LE swelling slightly increased; monitor for now but could consider increasing Torsemide back up.   Filed Weights   10/07/22 0500 10/07/22 1521 10/08/22 0415  Weight: (S) 75.9 kg 77.5 kg (S) 73.9 kg   Vitals:   10/05/22 1400 10/05/22 1936 10/05/22 2149 10/06/22 0313   BP: 114/88 112/83 106/79 112/83   10/06/22 1442 10/06/22 2002 10/06/22 2205 10/07/22 0521  BP: (!) 109/91 118/85 104/82 105/79   10/07/22 1522 10/07/22 1926 10/08/22 0404 10/08/22 0853  BP: 114/86 107/86 114/80 (!) 114/91    9. T2DM: Hgb A1C-10.8  (improving >15 @ 11/23). Was on Lantus 15u w/ novolog 12 units with supper>>not restarted             --monitor BS ac/hs and use SSI for elevated BS             --on SSI, Jardiance 10mg  QD -10/02/22 CBGs well controlled with 2-3U of novolog use per meal so far, monitor for trends  -3/13 increase Jardiance to 25mg   -10/08/22 CBGs stable, monitor CBG (last 3)  Recent Labs    10/07/22 2102 10/08/22 0559 10/08/22 1149  GLUCAP 178* 132* 155*    10. Severe obesity: Educate patient on diet, compliance and exercise to help promote health and mobility. Magnesium reviewed and normal.  11. Acute on Chronic renal failure/AKI: Diuretic held. Cr improved 3/13. Monitor weekly. Discussed with patient.  12. Hyperlipidemia: continue lipitor 80mg  QHS  13. Decreased initiation: increase amantadine to 200mg  daily.  14. Vitamin D insufficiency: 21.41 on 10/04/22, started cholecalciferol 2000IU daily and ergocalciferol 50,000U once per week for 7 weeks (?unintentional duplication?) 15. Bilateral lower extremity throbbing: vascular ultrasound ordered, if negative, discussed we can order SCDs for her  -10/08/22 DVT study neg, ordered SCDs 16. Constipation: -10/08/22 no BM in 6 days, increased Miralax 17g to BID, add colace 100mg  QD; monitor for effect.    LOS: 7 days A FACE TO [place] 10/08/2022, 11:51 AM

## 2022-10-08 NOTE — Plan of Care (Signed)
  Problem: Consults Goal: RH STROKE PATIENT EDUCATION Description: See Patient Education module for education specifics  Outcome: Progressing   Problem: RH BOWEL ELIMINATION Goal: RH STG MANAGE BOWEL WITH ASSISTANCE Description: STG Manage Bowel with min Assistance. Outcome: Progressing Goal: RH STG MANAGE BOWEL W/MEDICATION W/ASSISTANCE Description: STG Manage Bowel with Medication with mod I Assistance. Outcome: Progressing   Problem: RH BLADDER ELIMINATION Goal: RH STG MANAGE BLADDER WITH ASSISTANCE Description: STG Manage Bladder With min toileting Assistance Outcome: Progressing   Problem: RH SAFETY Goal: RH STG ADHERE TO SAFETY PRECAUTIONS W/ASSISTANCE/DEVICE Description: STG Adhere to Safety Precautions With cues Assistance/Device. Outcome: Progressing   Problem: RH PAIN MANAGEMENT Goal: RH STG PAIN MANAGED AT OR BELOW PT'S PAIN GOAL Description: < 4 with prns Outcome: Progressing   Problem: RH KNOWLEDGE DEFICIT Goal: RH STG INCREASE KNOWLEDGE OF DIABETES Description: Patient and daughter will be able to manage DM with medications and dietary modifications using educational resources independently Outcome: Progressing Goal: RH STG INCREASE KNOWLEDGE OF HYPERTENSION Description: Patient and daughter will be able to manage HTN with medications and dietary modifications using educational resources independently Outcome: Progressing Goal: RH STG INCREASE KNOWLEDGE OF DYSPHAGIA/FLUID INTAKE Description: Patient and daughter will be able to manage Dysphagia, medications and dietary modifications using educational resources independently Outcome: Progressing Goal: RH STG INCREASE KNOWLEGDE OF HYPERLIPIDEMIA Description: Patient and daughter will be able to manage HLD with medications and dietary modifications using educational resources independently Outcome: Progressing Goal: RH STG INCREASE KNOWLEDGE OF STROKE PROPHYLAXIS Description: Patient and daughter will be able to  manage secondary stroke risks with medications and dietary modifications using educational resources independently Outcome: Progressing   Problem: Education: Goal: Understanding of CV disease, CV risk reduction, and recovery process will improve Outcome: Progressing Goal: Individualized Educational Video(s) Outcome: Progressing   Problem: Activity: Goal: Ability to return to baseline activity level will improve Outcome: Progressing   Problem: Cardiovascular: Goal: Ability to achieve and maintain adequate cardiovascular perfusion will improve Outcome: Progressing Goal: Vascular access site(s) Level 0-1 will be maintained Outcome: Progressing   Problem: Health Behavior/Discharge Planning: Goal: Ability to safely manage health-related needs after discharge will improve Outcome: Progressing   Problem: Education: Goal: Knowledge of disease or condition will improve Outcome: Progressing Goal: Knowledge of secondary prevention will improve (MUST DOCUMENT ALL) Outcome: Progressing Goal: Knowledge of patient specific risk factors will improve [place] N/A or DELETE if not current risk factor) Outcome: Progressing   Problem: Ischemic Stroke/TIA Tissue Perfusion: Goal: Complications of ischemic stroke/TIA will be minimized Outcome: Progressing   Problem: Coping: Goal: Will verbalize positive feelings about self Outcome: Progressing Goal: Will identify appropriate support needs Outcome: Progressing   Problem: Health Behavior/Discharge Planning: Goal: Ability to manage health-related needs will improve Outcome: Progressing Goal: Goals will be collaboratively established with patient/family Outcome: Progressing   Problem: Self-Care: Goal: Ability to participate in self-care as condition permits will improve Outcome: Progressing Goal: Verbalization of feelings and concerns over difficulty with self-care will improve Outcome: Progressing Goal: Ability to communicate needs accurately  will improve Outcome: Progressing   Problem: Nutrition: Goal: Risk of aspiration will decrease Outcome: Progressing Goal: Dietary intake will improve Outcome: Progressing

## 2022-10-08 NOTE — Progress Notes (Signed)
Patient easy to wake this morning for vitals. Action induced tremors with right hand was observed when patient was rubbing their nose. With arm being repositioned tremors stopped  after 15 seconds but continued in fingers in right hand for a minute. Obtained ordered daily weight at this time. Weight is 73.9 kg. Bed zeroed and weighed similarly the morning prior. Having 5 episodes of urine occurrences in last nine hours. Continues to not have a BM and last BM 10/02/22.

## 2022-10-08 NOTE — Progress Notes (Signed)
Bilateral lower extremity venous study completed.   Preliminary results relayed to RN.  Please see CV Procedures for preliminary results.  Candie Chroman, RVT  9:48 AM 10/08/22

## 2022-10-09 DIAGNOSIS — M79604 Pain in right leg: Secondary | ICD-10-CM

## 2022-10-09 DIAGNOSIS — M79605 Pain in left leg: Secondary | ICD-10-CM

## 2022-10-09 LAB — GLUCOSE, CAPILLARY
Glucose-Capillary: 132 mg/dL — ABNORMAL HIGH (ref 70–99)
Glucose-Capillary: 186 mg/dL — ABNORMAL HIGH (ref 70–99)
Glucose-Capillary: 100 mg/dL — ABNORMAL HIGH (ref 70–99)
Glucose-Capillary: 131 mg/dL — ABNORMAL HIGH (ref 70–99)

## 2022-10-09 MED ORDER — METHOCARBAMOL 500 MG PO TABS
500.0000 mg | ORAL_TABLET | Freq: Four times a day (QID) | ORAL | Status: DC | PRN
  Administered 2022-10-09 – 2022-10-30 (×8): 500 mg via ORAL
  Filled 2022-10-09 (×11): qty 1

## 2022-10-09 NOTE — Progress Notes (Signed)
Occupational Therapy Session Note  Patient Details  Name: [name] MRN: [id number] Date of Birth: 04/01/1976  Today's Date: 10/09/2022 OT Individual Time: 1310-1415 OT Individual Time Calculation (min): 65 min    Short Term Goals: Week 1:  OT Short Term Goal 1 (Week 1): Pt will complete STS transfer with Mod A + LRAD, in preparation for ADL participation. OT Short Term Goal 2 (Week 1): Pt will complete squat-pivot transfer from EOB<>BSC with Mod A + LRAD. OT Short Term Goal 3 (Week 1): Pt will complete LB ADLs with Max A + LRAD.  Skilled Therapeutic Interventions/Progress Updates:    Patient indicates her leg was "jumping" last night and she had to get medicine because it was painful.  No pain now.  Patient received supine in bed - but just starting to eat her lunch upon arrival.  Patient's father present.  Gave patient 10 min to eat, then returned and patient ready for therapy.  Assisted patient to edge of bed - no movement noted in RLE to get foot to floor.  Assisted with scooting and LB dressing at edge of bed.  Sit to stand with mod assist - less assistance to manage upright posture in standing.  Able to attempt side stepping in standing.   Transported to gym to further address participation and decreased assistance for transfers.  Patient consistently able to ask if brakes are locked prior to trasnfer as instructed.  Showing improved ability to maintain active base of support in squat and shift hips left/right as needed for transfer.  Biased weight toward left arm and worked on weight bearing/ weight shifting to left.  Starting to see movement toward shoulder flex/ext/adduction.   Completed stand step transfer to chair with max assist.   Transported back to room and left patient up in wheelchair with safety belt in place and engaged and call bell / personal items in reach.  Dad at bedside.    Therapy Documentation Precautions:  Precautions Precautions: Fall Precaution Comments:  Left hemiparesis Required Braces or Orthoses: Other Brace Other Brace: L Resting hand splint and L PRAFO Restrictions Weight Bearing Restrictions: No  Pain: Pain Assessment Pain Score: 0  Therapy/Group: Individual Therapy  Mariah Milling 10/09/2022, 3:38 PM

## 2022-10-09 NOTE — Progress Notes (Signed)
PROGRESS NOTE   Subjective/Complaints: Doing well overnight, but states she's having some spasms in her legs. Wants something for that. States that her SCDs weren't working, asked nursing to adjust them. Slept poorly again. Had a small BM this morning, feels a little nauseated this morning but no other symptoms with it. Urinating same as always.   Otherwise no complaints.   ROS: + back and neck pain-improved, +decreased initiation as per therapy. +throbbing in b/l lower extremities-ongoing. +Constipation-improving. Denies CP, SOB, abd pain, n/v/d.    Objective:   VAS [place] LOWER EXTREMITY VENOUS (DVT)  Result Date: 10/08/2022  Lower Venous DVT Study Patient Name:  [name]  Date of Exam:   10/08/2022 Medical Rec #: [id number]        Accession #:    [id number] Date of Birth: 1975-09-23         Patient Gender: F Patient Age:   47 years Exam Location:  [facility] Procedure:      VAS [place] LOWER EXTREMITY VENOUS (DVT) Referring Phys: Leeroy Cha --------------------------------------------------------------------------------  Indications: Pain, and Swelling.  Anticoagulation: Eliquis. Comparison Study: No prior study. Performing Technologist: McKayla Maag RVT, [place]  Examination Guidelines: A complete evaluation includes B-mode imaging, spectral Doppler, color Doppler, and power Doppler as needed of all accessible portions of each vessel. Bilateral testing is considered an integral part of a complete examination. Limited examinations for reoccurring indications may be performed as noted. The reflux portion of the exam is performed with the patient in reverse Trendelenburg.  +---------+---------------+---------+-----------+----------+-------------------+ RIGHT    CompressibilityPhasicitySpontaneityPropertiesThrombus Aging      +---------+---------------+---------+-----------+----------+-------------------+ CFV      Full           Yes       Yes                                      +---------+---------------+---------+-----------+----------+-------------------+ SFJ      Full                                                             +---------+---------------+---------+-----------+----------+-------------------+ FV Prox  Full                                                             +---------+---------------+---------+-----------+----------+-------------------+ FV Mid   Full                                                             +---------+---------------+---------+-----------+----------+-------------------+ FV DistalFull                                                             +---------+---------------+---------+-----------+----------+-------------------+  PFV      Full                                                             +---------+---------------+---------+-----------+----------+-------------------+ POP      Full           Yes      Yes                                      +---------+---------------+---------+-----------+----------+-------------------+ PTV                     Yes      Yes                  Not well visualized +---------+---------------+---------+-----------+----------+-------------------+ PERO                    Yes      Yes                  Not well visualized +---------+---------------+---------+-----------+----------+-------------------+   +---------+---------------+---------+-----------+----------+-------------------+ LEFT     CompressibilityPhasicitySpontaneityPropertiesThrombus Aging      +---------+---------------+---------+-----------+----------+-------------------+ CFV      Full           Yes      Yes                                      +---------+---------------+---------+-----------+----------+-------------------+ SFJ      Full                                                              +---------+---------------+---------+-----------+----------+-------------------+ FV Prox  Full                                                             +---------+---------------+---------+-----------+----------+-------------------+ FV Mid   Full                                                             +---------+---------------+---------+-----------+----------+-------------------+ FV DistalFull                                                             +---------+---------------+---------+-----------+----------+-------------------+ PFV      Full                                                             +---------+---------------+---------+-----------+----------+-------------------+  POP      Full           Yes      Yes                                      +---------+---------------+---------+-----------+----------+-------------------+ PTV                     Yes      Yes                  Not well visualized +---------+---------------+---------+-----------+----------+-------------------+ PERO                    Yes      Yes                  Not well visualized +---------+---------------+---------+-----------+----------+-------------------+     Summary: RIGHT: - There is no evidence of deep vein thrombosis in the lower extremity. However, portions of this examination were limited- see technologist comments above.  - No cystic structure found in the popliteal fossa.  LEFT: - There is no evidence of deep vein thrombosis in the lower extremity. However, portions of this examination were limited- see technologist comments above.  - No cystic structure found in the popliteal fossa.  *See table(s) above for measurements and observations. Electronically signed by Servando Snare MD on 10/08/2022 at 12:11:56 PM.    Final    No results for input(s): "WBC", "HGB", "HCT", "PLT" in the last 72 hours.  No results for input(s): "NA", "K", "CL", "CO2", "GLUCOSE", "BUN",  "CREATININE", "CALCIUM" in the last 72 hours.   Intake/Output Summary (Last 24 hours) at 10/09/2022 1109 Last data filed at 10/09/2022 0508 Gross per 24 hour  Intake --  Output 450 ml  Net -450 ml        Physical Exam: Vital Signs Blood pressure 112/77, pulse 72, temperature 98 F (36.7 C), temperature source Oral, resp. rate 18, height 4\' 11"  (1.499 m), weight 73 kg, last menstrual period 06/22/2022, SpO2 97 %.  Constitutional: No distress . Vital signs reviewed. Obese.  HEENT: NCAT, EOMI, oral membranes moist.  Neck: supple Cardiovascular: RRR without murmur. No JVD    Respiratory/Chest: CTA Bilaterally without wheezes or rales. Normal effort    GI/Abdomen: BS +, non-tender, non-distended Ext: no clubbing or cyanosis, 1-2+ BLE edema Psych: very flat, depressed Skin: dry, warm MSK: weak in L arm, able to move R arm around in bed, full MsK exam not performed.  Neurologic: Pt less tired than before. Speech dysarthric, low volume. Language appears to be intact.    PRIOR EXAM: Neurologic: Right gaze preference but can follow to left to a lesser extent. No obvious field cut. Left central 7 and tongue deviation. LUE and LLE 0/5. Senses pain and LT in all 4's. No resting tone. DTR's 1+ throughout. Toes down left foot, no clonus.    Assessment/Plan: 1. Functional deficits which require 3+ hours per day of interdisciplinary therapy in a comprehensive inpatient rehab setting. Physiatrist is providing close team supervision and 24 hour management of active medical problems listed below. Physiatrist and rehab team continue to assess barriers to discharge/monitor patient progress toward functional and medical goals  Care Tool:  Bathing    Body parts bathed by patient: Chest, Abdomen, Front perineal area, Face   Body parts bathed by helper: Right arm, Left arm, Buttocks, Right upper leg,  Right lower leg, Left upper leg, Left lower leg     Bathing assist Assist Level: Total Assistance  - Patient < 25%     Upper Body Dressing/Undressing Upper body dressing   What is the patient wearing?: Pull over shirt    Upper body assist Assist Level: Maximal Assistance - Patient 25 - 49%    Lower Body Dressing/Undressing Lower body dressing      What is the patient wearing?: Pants     Lower body assist Assist for lower body dressing: Total Assistance - Patient < 25%     Toileting Toileting    Toileting assist Assist for toileting: 2 Helpers     Transfers Chair/bed transfer  Transfers assist  Chair/bed transfer activity did not occur: Safety/medical concerns  Chair/bed transfer assist level: 2 Helpers     Locomotion Ambulation   Ambulation assist   Ambulation activity did not occur: Safety/medical concerns          Walk 10 feet activity   Assist  Walk 10 feet activity did not occur: Safety/medical concerns        Walk 50 feet activity   Assist Walk 50 feet with 2 turns activity did not occur: Safety/medical concerns         Walk 150 feet activity   Assist Walk 150 feet activity did not occur: Safety/medical concerns         Walk 10 feet on uneven surface  activity   Assist Walk 10 feet on uneven surfaces activity did not occur: Safety/medical concerns         Wheelchair     Assist Is the patient using a wheelchair?: Yes Type of Wheelchair: Manual Wheelchair activity did not occur: Safety/medical concerns         Wheelchair 50 feet with 2 turns activity    Assist    Wheelchair 50 feet with 2 turns activity did not occur: Safety/medical concerns       Wheelchair 150 feet activity     Assist  Wheelchair 150 feet activity did not occur: Safety/medical concerns       Blood pressure 112/77, pulse 72, temperature 98 F (36.7 C), temperature source Oral, resp. rate 18, height 4\' 11"  (1.499 m), weight 73 kg, last menstrual period 06/22/2022, SpO2 97 %.    Medical Problem List and Plan: 1. 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  Antithrombotics: -DVT/anticoagulation:  Pharmaceutical: Eliquis 5mg  BID             -antiplatelet therapy: N/A 3.Back pain/Pain Management:  Tylenol prn. Oxycodone making her nausea worse and ineffective--changed to MSIR 15mg  q6h PRN prior to d/c from acute, reordered here  4. Mood/Behavior/Sleep: LCSW to follow for evaluation and support.              -antipsychotic agents: N/A -pt reports chronic anxiety at baseline. Expresses that she's depressed as well currently.              -took atarax at home 10mg  tid prn. Will resume.             -begin lexapro 5mg  qhs             -  neuropsych eval -10/02/22 started Trazodone 25mg  QHS and Hydroxyzine 25mg  QHS for now, monitor for effect or oversedation; left Hydroxyzine 10mg  TID PRN dosing for anxiety in place  -10/08/22 not sleeping well; added melatonin 5mg , monitor for improvement  5. Neuropsych/cognition: This patient is not capable of making decisions on her own behalf. 6. Skin/Wound Care:  Routine pressure relief  measures.  7. Fluids/Electrolytes/Nutrition: Monitor I/O. Monitor routine labs weekly, next 10/10/22 8. Acute on chronic combined CHF: HH diet.  -Monitor weights daily and for signs of overload. -continue Torsemide 40mg  QD (>>10mg  QD), Losartan 50mg  QD, Spironolactone 25mg  QD, Jardiance 10mg  QD (>>25mg  QD), and Metoprolol 25mg  BID  -Weight reviewed 3/15 and is significantly downtrending, asked RN to reweigh and discussed with RN what patient has been eating, decrease torsemide to 10mg . Monitor weights daily.  -10/08/22 wt fairly stable and BP stable, but LE swelling slightly increased; monitor for now but could consider increasing Torsemide back up.  -10/09/22 wt stable, LE swelling stable from yesterday  but still slightly more than last week. Monitor closely. BP stable.  Filed Weights   10/07/22 1521 10/08/22 0415 10/09/22 0502  Weight: 77.5 kg (S) 73.9 kg 73 kg   Vitals:   10/06/22 1442 10/06/22 2002 10/06/22 2205 10/07/22 0521  BP: (!) 109/91 118/85 104/82 105/79   10/07/22 1522 10/07/22 1926 10/08/22 0404 10/08/22 0853  BP: 114/86 107/86 114/80 (!) 114/91   10/08/22 1441 10/08/22 1958 10/08/22 2130 10/09/22 0502  BP: 99/73 105/80 110/80 112/77    9. T2DM: Hgb A1C-10.8  (improving >15 @ 11/23). Was on Lantus 15u w/ novolog 12 units with supper>>not restarted             --monitor BS ac/hs and use SSI for elevated BS             --on SSI, Jardiance 10mg  QD -10/02/22 CBGs well controlled with 2-3U of novolog use per meal so far, monitor for trends  -3/13 increase Jardiance to 25mg   -10/09/22 CBGs stable, mostly fair control, monitor CBG (last 3)  Recent Labs    10/08/22 1728 10/08/22 2113 10/09/22 0608  GLUCAP 189* 132* 132*    10. Severe obesity: Educate patient on diet, compliance and exercise to help promote health and mobility. Magnesium reviewed and normal.  11. Acute on Chronic renal failure/AKI: Diuretic held. Cr improved 3/13. Monitor weekly. Discussed with patient.  12. Hyperlipidemia: continue lipitor 80mg  QHS  13. Decreased initiation: increase amantadine to 200mg  daily.  14. Vitamin D insufficiency: 21.41 on 10/04/22, started cholecalciferol 2000IU daily and ergocalciferol 50,000U once per week for 7 weeks (?unintentional duplication? Defer to weekday team) 15. Bilateral lower extremity throbbing: vascular ultrasound ordered, if negative, discussed we can order SCDs for her  -10/08/22 DVT study neg, ordered SCDs -10/09/22 SCDs need adjusting, asked nursing to assist; added Robaxin 500mg  q6h PRN for leg spasms 16. Constipation: -10/08/22 no BM in 6 days, increased Miralax 17g to BID, add colace 100mg  QD; monitor for effect.  -10/09/22 BM this morning, continue  regimen   LOS: 8 days A FACE TO [place] 10/09/2022, 11:09 AM

## 2022-10-10 LAB — GLUCOSE, CAPILLARY
Glucose-Capillary: 122 mg/dL — ABNORMAL HIGH (ref 70–99)
Glucose-Capillary: 147 mg/dL — ABNORMAL HIGH (ref 70–99)
Glucose-Capillary: 121 mg/dL — ABNORMAL HIGH (ref 70–99)
Glucose-Capillary: 133 mg/dL — ABNORMAL HIGH (ref 70–99)

## 2022-10-10 LAB — CBC
HCT: 37 % (ref 36.0–46.0)
Hemoglobin: 11.7 g/dL — ABNORMAL LOW (ref 12.0–15.0)
MCH: 22.3 pg — ABNORMAL LOW (ref 26.0–34.0)
MCHC: 31.6 g/dL (ref 30.0–36.0)
MCV: 70.6 fL — ABNORMAL LOW (ref 80.0–100.0)
Platelets: 280 10*3/uL (ref 150–400)
RBC: 5.24 MIL/uL — ABNORMAL HIGH (ref 3.87–5.11)
RDW: 22.5 % — ABNORMAL HIGH (ref 11.5–15.5)
WBC: 8.2 10*3/uL (ref 4.0–10.5)
nRBC: 0 % (ref 0.0–0.2)

## 2022-10-10 LAB — BASIC METABOLIC PANEL
Anion gap: 9 (ref 5–15)
BUN: 11 mg/dL (ref 6–20)
CO2: 29 mmol/L (ref 22–32)
Calcium: 8.9 mg/dL (ref 8.9–10.3)
Chloride: 96 mmol/L — ABNORMAL LOW (ref 98–111)
Creatinine, Ser: 1.31 mg/dL — ABNORMAL HIGH (ref 0.44–1.00)
GFR, Estimated: 52 mL/min — ABNORMAL LOW (ref >60)
Glucose, Bld: 105 mg/dL — ABNORMAL HIGH (ref 70–99)
Potassium: 4 mmol/L (ref 3.5–5.1)
Sodium: 134 mmol/L — ABNORMAL LOW (ref 135–145)

## 2022-10-10 MED ORDER — TORSEMIDE 20 MG PO TABS
10.0000 mg | ORAL_TABLET | Freq: Every day | ORAL | Status: DC
  Administered 2022-10-11 – 2022-10-12 (×2): 10 mg via ORAL
  Filled 2022-10-10 (×2): qty 1

## 2022-10-10 NOTE — Progress Notes (Signed)
Speech Language Pathology Daily Session Note  Patient Details  Name: [name] MRN: [id number] Date of Birth: 1976/07/03  Today's Date: 10/10/2022 SLP Individual Time: 1345-1445 SLP Individual Time Calculation (min): 60 min  Short Term Goals: SLP Short Term Goal 1 (Week 1): Pt will improve orientation to person, place, time and situation given mod verbal and visual cues with 100% acc SLP Short Term Goal 2 (Week 1): Pt will clear dys 3 solids from lateral sulcus given min verbal and visual cues with 80% acc SLP Short Term Goal 3 (Week 1): Pt will recall novel, functional information post 3-5 minute delay with 80% acc with mod verbal and visual cues. SLP Short Term Goal 4 (Week 1): Pt will consume thin liquids without s/sx pen/asp given min A in 90% opportunities SLP Short Term Goal 5 (Week 1): Pt will identify solutions to problems within least restrictive environment with 90%acc with min verbal and visual cues.   Skilled Therapeutic Interventions:   Pt was seen in PM to address cognitive re- training. Pt was alert and seated upright in WC upon SLP arrival. She was agreeable for session. Flat affect continued. SLP addressed problem solving within current environment and recall of novel and functional information. SLP located pt's memory book which was implemented in previous session. Given min A, pt identified date. She was then able to contribute additional information including upcoming family members birthday. SLP facilitated pt in identifying daily events. She recalled previous PT and OT sessions as well as visit from her close friend. Pt reports poor recall of recent medical hx. SLP guided pt in writing information down to facilitate recall at a later time. SLP also addressed problem solving within current environment. Given scenarios, presented verbally, pt identified solutions with 67% acc indep improving to 80% with mod A. In missed opportunities, SLP provided appropriate response and  rationale. Pt was left upright in WC with call button within reach. SLP to continue POC.   Pain  No Pain  Therapy/Group: Individual Therapy  Colin Benton 10/10/2022, 2:44 PM

## 2022-10-10 NOTE — Progress Notes (Signed)
Occupational Therapy Session Note  Patient Details  Name: [name] MRN: [id number] Date of Birth: [birth year]  Today's Date: 10/10/2022 OT Individual Time: 1015-1058 OT Individual Time Calculation (min): 43 min    Short Term Goals: Week 1:  OT Short Term Goal 1 (Week 1): Pt will complete STS transfer with Mod A + LRAD, in preparation for ADL participation. OT Short Term Goal 2 (Week 1): Pt will complete squat-pivot transfer from EOB<>BSC with Mod A + LRAD. OT Short Term Goal 3 (Week 1): Pt will complete LB ADLs with Max A + LRAD.  Skilled Therapeutic Interventions/Progress Updates:    Patient received seated in wheelchair fully dressed.  Transported to gym to address functional level surface transfers and neuromuscular reeducation to LUE.  Patient showing improved ability to complete squat pivot transfers - limited mostly by length of legs/ height of surface.  Today squat pivot transfer in 2 moves with mod/min assist.  Biased weight toward left side and worked on attention to left, proximal movement of shoulder.  Used UE Ranger to allow support to LUE - patient able to isolate adduction on multiple occasions, and even could relax into abduction.  Patient reporting discomfort in back with weight shifted to left, and sitting upright.  Transferred stand step with max assist toward right.  Returned to room and left up in wheelchair with call bell and personal items in reach and safety belt in place.    Therapy Documentation Precautions:  Precautions Precautions: Fall Precaution Comments: Left hemiparesis Required Braces or Orthoses: Other Brace Other Brace: L Resting hand splint and L PRAFO Restrictions Weight Bearing Restrictions: No Pain: Pain Assessment Pain Scale: 0-10 Pain Score: 0-No pain    Therapy/Group: Individual Therapy  Mariah Milling 10/10/2022, 12:08 PM

## 2022-10-10 NOTE — Progress Notes (Signed)
Occupational Therapy Session Note  Patient Details  Name: [name] MRN: [id number] Date of Birth: 05-Mar-1976  Today's Date: 10/10/2022 OT Individual Time: 1301-1330 OT Individual Time Calculation (min): 29 min    Short Term Goals: Week 1:  OT Short Term Goal 1 (Week 1): Pt will complete STS transfer with Mod A + LRAD, in preparation for ADL participation. OT Short Term Goal 2 (Week 1): Pt will complete squat-pivot transfer from EOB<>BSC with Mod A + LRAD. OT Short Term Goal 3 (Week 1): Pt will complete LB ADLs with Max A + LRAD.  Skilled Therapeutic Interventions/Progress Updates:   Pt up in w/c upon OT arrival for brief session this pm. Pt reported needing to use the bathroom urgently. This OT is new with this pt therefore utilized the STEDY from w/c to commode for swiftness and safety. Pt was able to pull up to stand x 6 trials for garment mngt, peri hygiene and transfer on and off. Pt with improved midline posture with all components of task based on previous notes. OT cued pt throughout on steps of task and in joint protection and awareness of L hemi limb mngt. Pt was then able to perform standing within STEDY frame for hand washing integrating L UE with mod-max cues. Pt left w/c level for next session with alarm set, laptray for L UE support and needs including call button in reach.   Therapy Documentation Precautions:  Precautions Precautions: Fall Precaution Comments: Left hemiparesis Required Braces or Orthoses: Other Brace Other Brace: L Resting hand splint and L PRAFO Restrictions Weight Bearing Restrictions: No    Therapy/Group: Individual Therapy  Barnabas Lister 10/10/2022, 7:37 AM

## 2022-10-10 NOTE — Progress Notes (Signed)
PROGRESS NOTE   Subjective/Complaints: Spasms are causing her legs to jump, vascular ultrasound reviewed and is negative for clots, asked nursing to check that SCDs are working  ROS: + back and neck pain-improved, +decreased initiation as per therapy. +throbbing in b/l lower extremities-ongoing +Constipation-improving. Denies CP, SOB, abd pain, n/v/d.    Objective:   No results found. Recent Labs    10/10/22 0744  WBC 8.2  HGB 11.7*  HCT 37.0  PLT 280    Recent Labs    10/10/22 0744  NA 134*  K 4.0  CL 96*  CO2 29  GLUCOSE 105*  BUN 11  CREATININE 1.31*  CALCIUM 8.9     Intake/Output Summary (Last 24 hours) at 10/10/2022 0954 Last data filed at 10/10/2022 0816 Gross per 24 hour  Intake 296 ml  Output 300 ml  Net -4 ml        Physical Exam: Vital Signs Blood pressure (!) 108/96, pulse 76, temperature 98.2 F (36.8 C), temperature source Oral, resp. rate 18, height 4\' 11"  (1.499 m), weight 73 kg, last menstrual period 06/22/2022, SpO2 97 %.  Constitutional: No distress . Vital signs reviewed. Obese. BMI 32.51 HEENT: NCAT, EOMI, oral membranes moist.  Neck: supple Cardiovascular: RRR without murmur. No JVD    Respiratory/Chest: CTA Bilaterally without wheezes or rales. Normal effort    GI/Abdomen: BS +, non-tender, non-distended Ext: no clubbing or cyanosis, 1-2+ BLE edema Psych: very flat, depressed Skin: dry, warm MSK: weak in L arm, able to move R arm around in bed, full MsK exam not performed.  Neurologic: Pt less tired than before. Speech dysarthric, low volume. Language appears to be intact.    PRIOR EXAM: Neurologic: Right gaze preference but can follow to left to a lesser extent. No obvious field cut. Left central 7 and tongue deviation. LUE and LLE 0/5. Senses pain and LT in all 4's. No resting tone. DTR's 1+ throughout. Toes down left foot, no clonus.    Assessment/Plan: 1. Functional  deficits which require 3+ hours per day of interdisciplinary therapy in a comprehensive inpatient rehab setting. Physiatrist is providing close team supervision and 24 hour management of active medical problems listed below. Physiatrist and rehab team continue to assess barriers to discharge/monitor patient progress toward functional and medical goals  Care Tool:  Bathing    Body parts bathed by patient: Chest, Abdomen, Front perineal area, Face   Body parts bathed by helper: Right arm, Left arm, Buttocks, Right upper leg, Right lower leg, Left upper leg, Left lower leg     Bathing assist Assist Level: Total Assistance - Patient < 25%     Upper Body Dressing/Undressing Upper body dressing   What is the patient wearing?: Pull over shirt    Upper body assist Assist Level: Maximal Assistance - Patient 25 - 49%    Lower Body Dressing/Undressing Lower body dressing      What is the patient wearing?: Pants     Lower body assist Assist for lower body dressing: Total Assistance - Patient < 25%     Toileting Toileting    Toileting assist Assist for toileting: 2 Helpers     Transfers Chair/bed  transfer  Transfers assist  Chair/bed transfer activity did not occur: Safety/medical concerns  Chair/bed transfer assist level: 2 Helpers     Locomotion Ambulation   Ambulation assist   Ambulation activity did not occur: Safety/medical concerns          Walk 10 feet activity   Assist  Walk 10 feet activity did not occur: Safety/medical concerns        Walk 50 feet activity   Assist Walk 50 feet with 2 turns activity did not occur: Safety/medical concerns         Walk 150 feet activity   Assist Walk 150 feet activity did not occur: Safety/medical concerns         Walk 10 feet on uneven surface  activity   Assist Walk 10 feet on uneven surfaces activity did not occur: Safety/medical concerns         Wheelchair     Assist Is the patient  using a wheelchair?: Yes Type of Wheelchair: Manual Wheelchair activity did not occur: Safety/medical concerns         Wheelchair 50 feet with 2 turns activity    Assist    Wheelchair 50 feet with 2 turns activity did not occur: Safety/medical concerns       Wheelchair 150 feet activity     Assist  Wheelchair 150 feet activity did not occur: Safety/medical concerns       Blood pressure (!) 108/96, pulse 76, temperature 98.2 F (36.8 C), temperature source Oral, resp. rate 18, height 4\' 11"  (1.499 m), weight 73 kg, last menstrual period 06/22/2022, SpO2 97 %.    Medical Problem List and Plan: 1. 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  Antithrombotics: -DVT/anticoagulation:  Pharmaceutical: Eliquis 5mg  BID             -antiplatelet therapy: N/A 3.Back pain/Pain Management:  Tylenol prn. Oxycodone making her nausea worse and ineffective--changed to MSIR 15mg  q6h PRN prior to d/c from acute, reordered here  4. Mood/Behavior/Sleep: LCSW to follow for evaluation and support.              -antipsychotic agents: N/A -pt reports chronic anxiety at baseline. Expresses that she's depressed as well currently.              -took atarax at home 10mg  tid prn. Will resume.             -begin lexapro 5mg  qhs             -neuropsych eval -10/02/22 started Trazodone 25mg  QHS and Hydroxyzine 25mg  QHS for now, monitor for effect or oversedation; left Hydroxyzine 10mg  TID PRN dosing for anxiety in place  -10/08/22 not sleeping well; added melatonin 5mg , monitor for improvement  5. Neuropsych/cognition: This patient is not capable of making decisions on her own behalf. 6. Skin/Wound Care:  Routine pressure relief  measures.  7. Fluids/Electrolytes/Nutrition: Monitor I/O. Monitor  routine labs weekly, next 10/10/22 8. Acute on chronic combined CHF: HH diet.  -Monitor weights daily and for signs of overload. -continue Torsemide 40mg  QD (>>10mg  QD), Losartan 50mg  QD, Spironolactone 25mg  QD, Jardiance 10mg   QD (>>25mg  QD), and Metoprolol 25mg  BID  -Weight reviewed 3/15 and is significantly downtrending, asked RN to reweigh and discussed with RN what patient has been eating, decrease torsemide to 10mg . Monitor weights daily.  -10/08/22 wt fairly stable and BP stable, but LE swelling slightly increased; monitor for now but could consider increasing Torsemide back up.  -10/09/22 wt stable, LE swelling stable from yesterday but still slightly more than last week. Monitor closely. BP stable.  Filed Weights   10/08/22 0415 10/09/22 0502 10/10/22 0457  Weight: (S) 73.9 kg 73 kg 73 kg   Vitals:   10/07/22 1926 10/08/22 0404 10/08/22 0853 10/08/22 1441  BP: 107/86 114/80 (!) 114/91 99/73   10/08/22 1958 10/08/22 2130 10/09/22 0502 10/09/22 1532  BP: 105/80 110/80 112/77 110/82   10/09/22 1905 10/09/22 2105 10/10/22 0317 10/10/22 0327  BP: 99/72 99/86 (!) 122/90 (!) 108/96    9. T2DM: Hgb A1C-10.8  (improving >15 @ 11/23). Was on Lantus 15u w/ novolog 12 units with supper>>not restarted             --monitor BS ac/hs and use SSI for elevated BS             --on SSI, Jardiance 10mg  QD -10/02/22 CBGs well controlled with 2-3U of novolog use per meal so far, monitor for trends  -3/13 increase Jardiance to 25mg   -10/09/22 CBGs stable, mostly fair control, monitor CBG (last 3)  Recent Labs    10/09/22 1644 10/09/22 2106 10/10/22 0606  GLUCAP 100* 131* 122*    10. Severe obesity: Educate patient on diet, compliance and exercise to help promote health and mobility. Magnesium reviewed and normal.  11. Acute on Chronic renal failure/AKI: Diuretic held. Cr improved 3/13 but worsened 3/18. Decrease torsemide to 10mg . Monitor weekly. Discussed with patient. Weight continues to be  stable 12. Hyperlipidemia: continue lipitor 80mg  QHS  13. Decreased initiation: increase amantadine to 200mg  daily.  14. Vitamin D insufficiency: 21.41 on 10/04/22, started ergocalciferol 50,000U once per week for 7 weeks. Daily supplement d/ced 15. Bilateral lower extremity throbbing: -10/09/22 SCDs need adjusting, asked nursing to assist; added Robaxin 500mg  q6h PRN for leg spasms 3/18: [place] reviewed and negative for clots, asking nursing to check whether SCDs are working 16. Constipation: -10/08/22 no BM in 6 days, increased Miralax 17g to BID, add colace 100mg  QD; monitor for effect.  -10/09/22 BM this morning, continue regimen    LOS: 9 days A FACE TO FACE EVALUATION WAS PERFORMED  [name] P Erryn Dickison 10/10/2022, 9:54 AM

## 2022-10-10 NOTE — Progress Notes (Signed)
Physical Therapy Weekly Progress Note  Patient Details  Name: [name] MRN: [id number] Date of Birth: 1975-12-17  Beginning of progress report period: October 02, 2022 End of progress report period: October 10, 2022  Today's Date: 10/10/2022 PT Individual Time: 0845-1000 PT Individual Time Calculation (min): 75 min   Patient has met 1 of 4 short term goals.  [name] is making appropriate progress towards LTG of minA. Overall, she requires maxA for functional mobility including bed mobility and squat<>pivot transfers. Her sitting balance has improved and can statically sit unsupported with supervision. Sit<>stands vary from mod to minA depending on attention and surface height. She is ambulating ~20ft distances using the R hand rail with maxA and +2 assist for wheelchair follow. Her w/c has been provided with a firm back to improve postural control.   Patient continues to demonstrate the following deficits muscle weakness and muscle joint tightness, decreased cardiorespiratoy endurance, abnormal tone, unbalanced muscle activation, motor apraxia, and decreased coordination, decreased attention to left and decreased motor planning, decreased initiation, decreased attention, decreased awareness, decreased problem solving, decreased safety awareness, and delayed processing, and decreased sitting balance, decreased standing balance, decreased postural control, hemiplegia, and decreased balance strategies and therefore will continue to benefit from skilled PT intervention to increase functional independence with mobility.  Patient progressing toward long term goals..  Continue plan of care.  PT Short Term Goals Week 1:  PT Short Term Goal 1 (Week 1): Pt will roll side to side w/ min A. PT Short Term Goal 1 - Progress (Week 1): Progressing toward goal PT Short Term Goal 2 (Week 1): Pt will transfer sup to sit w/ mod A PT Short Term Goal 2 - Progress (Week 1): Progressing toward goal PT Short Term  Goal 3 (Week 1): Pt will maintain sitting balance x 5' w/ min A PT Short Term Goal 3 - Progress (Week 1): Met PT Short Term Goal 4 (Week 1): PT to assess gait. PT Short Term Goal 4 - Progress (Week 1): Not met Week 2:  PT Short Term Goal 1 (Week 2): Pt will complete bed mobility with modA PT Short Term Goal 2 (Week 2): Pt will complete bed<>chair transfers with modA and LRAD PT Short Term Goal 3 (Week 2): Pt will ambulate 41ft with maxA and LRAD  Skilled Therapeutic Interventions/Progress Updates:      Direct handoff of care from NT who was assisting patient to the wheelchair using the [facility]. Patient in agreement to therapy session - denies pain. Pt wanting to go outside to improve mood - patient transported downstairs in w/c for time management outside next to [facility]. While outdoors, worked on visual scanning to the L, head turns to the L, naming objects in her environment on the L, and playing "I spy" to challenge her. Patient appreciative of being outdoors.   Returned upstairs to CIR at w/c level for time management. Focused remainder of session on NMR, gait training using R hand rail, and sit<>stands with forced weight bearing through paretic LLE. Ace wrapped her L foot for DF assist. Sit<>Stand at hand rail with minA with L knee caged for safety in the event of buckling. Gait training 77ft + 62ft (seated rest) with maxA of 1 person with 2nd person providing w/c follow for safety. Used Geologist, engineering for Warden/ranger. PT facilitating advancing 100% of LLE during gait cycle and blocking in stance to prevent buckling. Patient required truncal support due to L lean. Trendelenburg present with hip drop and  insufficient glut activation. Blocked practice repeated sit<>stands from w/c to R hand rail with min/modA with emphasis on forced weight bearing to LLE as she compensates heavily, as well as reminders for hand placement to push from arm rests.   Patient returned to her room and she concluded session seated  in w/c with safety belt alarm on and all needs met.   Therapy Documentation Precautions:  Precautions Precautions: Fall Precaution Comments: Left hemiparesis Required Braces or Orthoses: Other Brace Other Brace: L Resting hand splint and L PRAFO Restrictions Weight Bearing Restrictions: No General:     Therapy/Group: Individual Therapy  [name] PT, DPT 10/10/2022, 7:44 AM

## 2022-10-11 ENCOUNTER — Other Ambulatory Visit (HOSPITAL_COMMUNITY): Payer: Medicare Other

## 2022-10-11 LAB — GLUCOSE, CAPILLARY
Glucose-Capillary: 143 mg/dL — ABNORMAL HIGH (ref 70–99)
Glucose-Capillary: 136 mg/dL — ABNORMAL HIGH (ref 70–99)
Glucose-Capillary: 159 mg/dL — ABNORMAL HIGH (ref 70–99)
Glucose-Capillary: 210 mg/dL — ABNORMAL HIGH (ref 70–99)

## 2022-10-11 NOTE — Progress Notes (Signed)
Speech Language Pathology Weekly Progress and Session Note  Patient Details  Name: ALYVEA KICK MRN: [id number] Date of Birth: 1975-10-12  Beginning of progress report period: October 03, 2022 End of progress report period: October 11, 2022  Today's Date: 10/11/2022 SLP Individual Time: 0900-1000 SLP Individual Time Calculation (min): 60 min  Short Term Goals: Week 1:  Week 1: SLP Short Term Goal 1 (Week 1): Pt will improve orientation to person, place, time and situation given mod verbal and visual cues with 100% acc Met SLP Short Term Goal 2 (Week 1): Pt will clear dys 3 solids from lateral sulcus given min verbal and visual cues with 80% acc Not Met SLP Short Term Goal 3 (Week 1): Pt will recall novel, functional information post 3-5 minute delay with 80% acc with mod verbal and visual cues. Met SLP Short Term Goal 4 (Week 1): Pt will consume thin liquids without s/sx pen/asp given min A in 90% opportunities Not Met SLP Short Term Goal 5 (Week 1): Pt will identify solutions to problems within least restrictive environment with 90%acc with min verbal and visual cues. Not Met    New Short Term Goals: SLP Short Term Goal 1 (Week 2): Patient will complete medication management tasks with min-to-mod A verbal cues to achieve 70% accuracy SLP Short Term Goal 2(Week 2): Patient will complete problem solving tasks with awareness of errors given min-to-mod A verbal cues to ID and repair SLP Short Term Goal 3 (Week 2): Pt will trial regular textures with functional pharyngeal and no overt s/sx concering for airway intrusion with Mod I for implementation of safe swallowing strategies.  SLP Short Term Goal 3 (Week 2): Pt will recall novel, functional information 15 minute delay with 80% acc with mod verbal and visual cues. Met   Weekly Progress Updates:  Pt made steady progress toward her cognitive goals during this reporting period. She consistently participated in skilled intervention and  subsequently met two goals. She met goals for orientation and recall of novel information within 3- 5 minutes. Pt reporting fluctuating emotional status however, she remained motivated to achieve her goals. Due to priority placed on cognition during this reporting period, plan to address dysphagia in upcoming reporting period in order to increase pt independence and QOL. Continued deficits persisted in areas of memory, attention, and executive function skills. SLP recommending continue Dys 3 and thin liquids with no straw at this time.    Intensity: Minimum of 1-2 x/day, 30 to 90 minutes  Frequency:  3 to 5 out of 7 days  Duration/Length of Stay:  4 weeks Treatment/Interventions:  Cognitive remediation/compensation;Speech/Language facilitation;Dysphagia/aspiration precaution training;Functional tasks;Patient/family education;Therapeutic Activities    Daily Session Skilled Therapeutic Interventions: Pt was seen in am to address cognitive re- training. SLP attempted to address dysphagia management with pt declining regular PO trials. Discussed with pt plan to trial regular diet in upcoming session with pt in agreement. Upon SLP arrival, pt observed to have been writing in memory book. She reported shower earlier this am and being pleased to finally "feel clean again." SLP instructed pt in WRAP compensatory strategies. SLP challenged pt to recall novel information presented verbally. Given a 10 minute delay, pt recalled information with 80% acc with min A. SLP also continued addressing problem solving within least restrictive environment. Pt identified solutions with 75% acc indep. In missed opportunities, SLP provided correct response and rationale. SLP discussed pt progress and continued POC. Pt was left upright in WC with call button within reach.  SLP to continue POC.       Pain No Pain.  Therapy/Group: Individual Therapy  Colin Benton 10/11/2022, 9:55 AM

## 2022-10-11 NOTE — Progress Notes (Signed)
Physical Therapy Session Note  Patient Details  Name: [name] MRN: [id number] Date of Birth: Jan 02, 1976  Today's Date: 10/11/2022 PT Individual Time: 1500-1525 PT Individual Time Calculation (min): 25 min   Short Term Goals: Week 2:  PT Short Term Goal 1 (Week 2): Pt will complete bed mobility with modA PT Short Term Goal 2 (Week 2): Pt will complete bed<>chair transfers with modA and LRAD PT Short Term Goal 3 (Week 2): Pt will ambulate 40ft with maxA and LRAD  Skilled Therapeutic Interventions/Progress Updates:      Patient greeted sitting in w/c to start with her father at the bedside. Patient agreeable to therapy without reports of pain during treatment.   Transported to main rehab gym and wheeled to 3inch stairs. Focused session on NMR with forced weight bearing on LLE to facilitate stance phase control to carry over into gait. Sit<>stand to stairs using R hand rail to pull herself up from. PT blocking L knee to prevent buckling and forward progression of tibia. Patient performing repeated toe taps to 3inch step with PT providing mod/maxA for standing balance and also for blocking LLE to prevent buckling. Completed 2x15 reps total with improved confidence and motor planning as reps continued  Returned to her room and assisted to bed with stand step transfer and modA using R hand rail for support and PT blocking LLE during stance to prevent buckling - continued carryover of stepping sequencing during transfer and PT facilitating weight shifting as needed. MaxA required for sit>Supine for BLE management. Call bell in reach and alarm on, patient's father at the bedside.   Therapy Documentation Precautions:  Precautions Precautions: Fall Precaution Comments: Left hemiparesis Required Braces or Orthoses: Other Brace Other Brace: L Resting hand splint and L PRAFO Restrictions Weight Bearing Restrictions: No General:     Therapy/Group: Individual Therapy  [name] 10/11/2022, 7:39 AM

## 2022-10-11 NOTE — Progress Notes (Signed)
PROGRESS NOTE   Subjective/Complaints: No new complaints this morning Working with SLP Denies pain  ROS: + back and neck pain-improved, +decreased initiation as per therapy. +throbbing in b/l lower extremities-ongoing +Constipation-improving. Denies CP, SOB, abd pain, n/v/d.    Objective:   No results found. Recent Labs    10/10/22 0744  WBC 8.2  HGB 11.7*  HCT 37.0  PLT 280    Recent Labs    10/10/22 0744  NA 134*  K 4.0  CL 96*  CO2 29  GLUCOSE 105*  BUN 11  CREATININE 1.31*  CALCIUM 8.9     Intake/Output Summary (Last 24 hours) at 10/11/2022 1001 Last data filed at 10/11/2022 0756 Gross per 24 hour  Intake 836 ml  Output 400 ml  Net 436 ml        Physical Exam: Vital Signs Blood pressure 120/86, pulse 67, temperature 98 F (36.7 C), resp. rate 18, height 4\' 11"  (1.499 m), weight 72.3 kg, last menstrual period 06/22/2022, SpO2 100 %.  Constitutional: No distress . Vital signs reviewed. Obese. BMI 32.19 HEENT: NCAT, EOMI, oral membranes moist.  Neck: supple Cardiovascular: RRR without murmur. No JVD    Respiratory/Chest: CTA Bilaterally without wheezes or rales. Normal effort    GI/Abdomen: BS +, non-tender, non-distended Ext: no clubbing or cyanosis, 1-2+ BLE edema Psych: very flat, depressed Skin: dry, warm MSK: weak in L arm, able to move R arm around in bed, full MsK exam not performed.  Neurologic: Pt less tired than before. Speech dysarthric, low volume. Language appears to be intact.    PRIOR EXAM: Neurologic: Right gaze preference but can follow to left to a lesser extent. No obvious field cut. Left central 7 and tongue deviation. LUE and LLE 0/5. Senses pain and LT in all 4's. No resting tone. DTR's 1+ throughout. Toes down left foot, no clonus.    Assessment/Plan: 1. Functional deficits which require 3+ hours per day of interdisciplinary therapy in a comprehensive inpatient rehab  setting. Physiatrist is providing close team supervision and 24 hour management of active medical problems listed below. Physiatrist and rehab team continue to assess barriers to discharge/monitor patient progress toward functional and medical goals  Care Tool:  Bathing    Body parts bathed by patient: Chest, Abdomen, Front perineal area, Face   Body parts bathed by helper: Right arm, Left arm, Buttocks, Right upper leg, Right lower leg, Left upper leg, Left lower leg     Bathing assist Assist Level: Total Assistance - Patient < 25%     Upper Body Dressing/Undressing Upper body dressing   What is the patient wearing?: Pull over shirt    Upper body assist Assist Level: Maximal Assistance - Patient 25 - 49%    Lower Body Dressing/Undressing Lower body dressing      What is the patient wearing?: Pants     Lower body assist Assist for lower body dressing: Total Assistance - Patient < 25%     Toileting Toileting    Toileting assist Assist for toileting: 2 Helpers     Transfers Chair/bed transfer  Transfers assist  Chair/bed transfer activity did not occur: Safety/medical concerns  Chair/bed transfer assist  level: 2 Helpers     Locomotion Ambulation   Ambulation assist   Ambulation activity did not occur: Safety/medical concerns          Walk 10 feet activity   Assist  Walk 10 feet activity did not occur: Safety/medical concerns        Walk 50 feet activity   Assist Walk 50 feet with 2 turns activity did not occur: Safety/medical concerns         Walk 150 feet activity   Assist Walk 150 feet activity did not occur: Safety/medical concerns         Walk 10 feet on uneven surface  activity   Assist Walk 10 feet on uneven surfaces activity did not occur: Safety/medical concerns         Wheelchair     Assist Is the patient using a wheelchair?: Yes Type of Wheelchair: Manual Wheelchair activity did not occur: Safety/medical  concerns         Wheelchair 50 feet with 2 turns activity    Assist    Wheelchair 50 feet with 2 turns activity did not occur: Safety/medical concerns       Wheelchair 150 feet activity     Assist  Wheelchair 150 feet activity did not occur: Safety/medical concerns       Blood pressure 120/86, pulse 67, temperature 98 F (36.7 C), resp. rate 18, height 4\' 11"  (1.499 m), weight 72.3 kg, last menstrual period 06/22/2022, SpO2 100 %.    Medical Problem List and Plan: 1. 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  Antithrombotics: -DVT/anticoagulation:  Pharmaceutical: Eliquis 5mg  BID             -antiplatelet therapy: N/A 3.Back pain/Pain Management:  Tylenol prn. Oxycodone making her nausea worse and ineffective--changed to MSIR 15mg  q6h PRN prior to d/c from acute, reordered here  4. Mood/Behavior/Sleep: LCSW to follow for evaluation and support.              -antipsychotic agents: N/A -pt reports chronic anxiety at baseline. Expresses that she's depressed as well currently.              -took atarax at home 10mg  tid prn. Will resume.             -begin lexapro 5mg  qhs             -neuropsych eval -10/02/22 started Trazodone 25mg  QHS and Hydroxyzine 25mg  QHS for now, monitor for effect or oversedation; left Hydroxyzine 10mg  TID PRN dosing for anxiety in place  -10/08/22 not sleeping well; added melatonin 5mg , monitor for improvement  5. Neuropsych/cognition: This patient is not capable of making decisions on her own behalf. 6. Skin/Wound Care:  Routine pressure relief  measures.  7. Fluids/Electrolytes/Nutrition: Monitor I/O. Monitor routine labs weekly, next 10/10/22 8. Acute on chronic combined CHF: HH diet.  -Monitor weights daily and for signs of  overload. -continue Torsemide 40mg  QD (>>10mg  QD), Losartan 50mg  QD, Spironolactone 25mg  QD, Jardiance 10mg  QD (>>25mg  QD), and Metoprolol 25mg  BID  -Weight reviewed 3/15 and is significantly downtrending, asked RN to reweigh and discussed  with RN what patient has been eating, decrease torsemide to 10mg . Monitor weights daily.  -10/08/22 wt fairly stable and BP stable, but LE swelling slightly increased; monitor for now but could consider increasing Torsemide back up.  -10/09/22 wt stable, LE swelling stable from yesterday but still slightly more than last week. Monitor closely. BP stable.  Filed Weights   10/09/22 0502 10/10/22 0457 10/11/22 0500  Weight: 73 kg 73 kg 72.3 kg   Vitals:   10/08/22 1958 10/08/22 2130 10/09/22 0502 10/09/22 1532  BP: 105/80 110/80 112/77 110/82   10/09/22 1905 10/09/22 2105 10/10/22 0317 10/10/22 0327  BP: 99/72 99/86 (!) 122/90 (!) 108/96   10/10/22 1232 10/10/22 1926 10/10/22 2147 10/11/22 0541  BP: (!) 123/93 (!) 122/91 115/89 120/86    9. T2DM: Hgb A1C-10.8  (improving >15 @ 11/23). Was on Lantus 15u w/ novolog 12 units with supper>>not restarted             --monitor BS ac/hs and use SSI for elevated BS             --on SSI, Jardiance 10mg  QD -10/02/22 CBGs well controlled with 2-3U of novolog use per meal so far, monitor for trends  -3/13 increase Jardiance to 25mg   -10/09/22 CBGs stable, mostly fair control, monitor CBG (last 3)  Recent Labs    10/10/22 1655 10/10/22 2038 10/11/22 0628  GLUCAP 121* 133* 143*    10. Severe obesity: Educate patient on diet, compliance and exercise to help promote health and mobility. Magnesium reviewed and normal.  11. Acute on Chronic renal failure/AKI: Diuretic held. Cr improved 3/13 but worsened 3/18. Decrease torsemide to 10mg . Monitor weekly. Discussed with patient. Weight continues to be stable 12. Hyperlipidemia: continue lipitor 80mg  QHS  13. Decreased initiation: increase amantadine to 200mg  daily,  continue, appears to be helping 14. Vitamin D insufficiency: 21.41 on 10/04/22, contiue ergocalciferol 50,000U once per week for 7 weeks. Daily supplement d/ced 15. Bilateral lower extremity throbbing: -10/09/22 SCDs need adjusting, asked nursing to assist; added Robaxin 500mg  q6h PRN for leg spasms [place] reviewed and negative for clots, asking nursing to check whether SCDs are working, continue SCDs 16. Constipation: -10/08/22 no BM in 6 days, increased Miralax 17g to BID, add colace 100mg  QD; monitor for effect.  -10/09/22 BM this morning, continue regimen 3/18: messaged nursing regarding when last BM was    LOS: 10 days A FACE TO FACE EVALUATION WAS PERFORMED  Alona Danford P Arlin Sass 10/11/2022, 10:01 AM

## 2022-10-11 NOTE — Progress Notes (Signed)
Occupational Therapy Weekly Progress Note  Patient Details  Name: [name] MRN: [id number] Date of Birth: 02-12-1976  Beginning of progress report period: October 02, 2022 End of progress report period: October 11, 2022  Today's Date: 10/11/2022 OT Individual Time: [id number] and 1102-1200 OT Individual Time Calculation (min): 55 min and 58 min   Patient has met 3 of 3 short term goals.  Patient showing significant improvement with level of alertness and postural control    Patient continues to demonstrate the following deficits: impaired timing and sequencing, abnormal tone, decreased coordination, and decreased motor planning, decreased visual perceptual skills and field cut, decreased attention to left and decreased motor planning, decreased initiation, decreased attention, decreased awareness, decreased problem solving, decreased safety awareness, decreased memory, and delayed processing, and decreased sitting balance, decreased standing balance, decreased postural control, hemiplegia, and decreased balance strategies and therefore will continue to benefit from skilled OT intervention to enhance overall performance with BADL.  Patient progressing toward long term goals..  Continue plan of care.  OT Short Term Goals Week 1:  OT Short Term Goal 1 (Week 1): Pt will complete Sit to stand transfer with Mod A + LRAD, in preparation for ADL participation. OT Short Term Goal 1 - Progress (Week 1): Met OT Short Term Goal 2 (Week 1): Pt will complete squat-pivot transfer from EOB<>BSC with Mod A + LRAD. OT Short Term Goal 2 - Progress (Week 1): Met OT Short Term Goal 3 (Week 1): Pt will complete LB ADLs with Max A + LRAD. OT Short Term Goal 3 - Progress (Week 1): Met Week 2:  OT Short Term Goal 1 (Week 2): Patient will complete squat pivot transfer to wheelchair to right or left with min assist OT Short Term Goal 2 (Week 2): Patient will don pull over shirt with min assist OT Short Term Goal 3  (Week 2): Patient will stand sufficiently for toileting hygiene or clothing management with no more than min assist OT Short Term Goal 4 (Week 2): Patient will visually scan to left of midline to locate needed ADLitems without cueing  Skilled Therapeutic Interventions/Progress Updates:   1st Session:  Patient received supine in bed - agreeable to OT session.  Due to patient's improved postural control, able to shower today!  Used STEDY lift for transfer due to room set up.  Patient thrilled with being able to shower first time.  Needed cueing to attend and step by step instruction, however no Loss of Balance while seated.  Assisted patient with components of task which required significant forward bending.  Required max/mod assist for dressing.  Patient left up in wheelchair at end of session - call bell/ personal items in reach, and safety belt engaged,    2nd Session:  Patient received seated in wheelchair.  Agreeable to OT session.  Patient transported to gym for further work on transfer safety with either squat or stand pivot transfer.  Patient currently safest in OT session with squat pivot toward left side.  Patient now able to do stand step transfer toward right side with max assist.  Worked in seated position - long arm weight bearing, then supine to sidelying to increase visual attention to left side. Assisted patient to sidesitting position on left side with total assist, then working on lower trunk initiated weight shift with LUE supported to limit weight bearing thru forearm.  Assisted from sidesitting to sitting with max assist.  Patient able to then scoot herself forward to have feet in  contact with floor.  Returned to room, and left up in wheelchair for lunch.  Call bell and personal items in reach.  Safety belt alarm in place and engaged.  Therapy Documentation Precautions:  Precautions Precautions: Fall Precaution Comments: Left hemiparesis Required Braces or Orthoses: Other  Brace Other Brace: L Resting hand splint and L PRAFO Restrictions Weight Bearing Restrictions: No   Pain: Pain Assessment Pain Scale: 0-10 Pain Score: 0-No pain    Therapy/Group: Individual Therapy  Mariah Milling 10/11/2022, 12:52 PM

## 2022-10-12 LAB — GLUCOSE, CAPILLARY
Glucose-Capillary: 172 mg/dL — ABNORMAL HIGH (ref 70–99)
Glucose-Capillary: 271 mg/dL — ABNORMAL HIGH (ref 70–99)
Glucose-Capillary: 68 mg/dL — ABNORMAL LOW (ref 70–99)
Glucose-Capillary: 77 mg/dL (ref 70–99)
Glucose-Capillary: 100 mg/dL — ABNORMAL HIGH (ref 70–99)

## 2022-10-12 MED ORDER — TORSEMIDE 20 MG PO TABS
20.0000 mg | ORAL_TABLET | Freq: Every day | ORAL | Status: DC
  Administered 2022-10-13 – 2022-10-17 (×5): 20 mg via ORAL
  Filled 2022-10-12 (×5): qty 1

## 2022-10-12 NOTE — Significant Event (Signed)
Hypoglycemic Event  CBG: 68  Treatment: 4 oz juice/soda  Symptoms: None  Follow-up CBG: Time:1710 CBG Result:77  Possible Reasons for Event: Inadequate meal intake  Comments/MD notified: Raulker MD notified    Jake Seats Fard Borunda ATKINSO

## 2022-10-12 NOTE — Progress Notes (Signed)
Patient ID: [name], female   DOB: Nov 26, 1975, 48 y.o.   MRN: [id number]  Met with pt and her Aunt/Momma to give team conference update regarding progress this week in therapies and still working toward discharge of 4/8. Pt is feeling she is doing better and can see the progress she is trying her best. She reports her dad is preparing his home for her to come there. Family still working on going to daughter's or her father's at discharge. Both aware she will require care at discharge and will be doing hands on education prior to going home with all who will be providing care to her. Will continue to work on discharge needs.

## 2022-10-12 NOTE — Progress Notes (Signed)
PROGRESS NOTE   Subjective/Complaints: No new complaints this morning Patient's chart reviewed- No issues reported overnight Vitals signs stable  Weight up today  ROS: + back and neck pain-improved, +decreased initiation as per therapy. +throbbing in b/l lower extremities-ongoing +Constipation-improving. Denies CP, SOB, abd pain, n/v/d.    Objective:   No results found. Recent Labs    10/10/22 0744  WBC 8.2  HGB 11.7*  HCT 37.0  PLT 280    Recent Labs    10/10/22 0744  NA 134*  K 4.0  CL 96*  CO2 29  GLUCOSE 105*  BUN 11  CREATININE 1.31*  CALCIUM 8.9     Intake/Output Summary (Last 24 hours) at 10/12/2022 1025 Last data filed at 10/12/2022 0900 Gross per 24 hour  Intake 834 ml  Output 350 ml  Net 484 ml        Physical Exam: Vital Signs Blood pressure (!) 136/95, pulse 71, temperature 98.6 F (37 C), temperature source Oral, resp. rate 16, height 4\' 11"  (1.499 m), weight 74.2 kg, last menstrual period 06/22/2022, SpO2 100 %.  Constitutional: No distress . Vital signs reviewed. Obese. BMI 33.04 HEENT: NCAT, EOMI, oral membranes moist.  Neck: supple Cardiovascular: RRR without murmur. No JVD    Respiratory/Chest: CTA Bilaterally without wheezes or rales. Normal effort    GI/Abdomen: BS +, non-tender, non-distended Ext: no clubbing or cyanosis, 1-2+ BLE edema Psych: very flat, depressed Skin: dry, warm MSK: weak in L arm, able to move R arm around in bed, full MsK exam not performed.  Neurologic: Pt less tired than before. Speech dysarthric, low volume. Language appears to be intact.    PRIOR EXAM: Neurologic: Right gaze preference but can follow to left to a lesser extent. No obvious field cut. Left central 7 and tongue deviation. LUE and LLE 0/5. Senses pain and LT in all 4's. No resting tone. DTR's 1+ throughout. Toes down left foot, no clonus.    Assessment/Plan: 1. Functional deficits which  require 3+ hours per day of interdisciplinary therapy in a comprehensive inpatient rehab setting. Physiatrist is providing close team supervision and 24 hour management of active medical problems listed below. Physiatrist and rehab team continue to assess barriers to discharge/monitor patient progress toward functional and medical goals  Care Tool:  Bathing    Body parts bathed by patient: Left arm, Chest, Abdomen, Front perineal area, Right upper leg, Left upper leg, Face   Body parts bathed by helper: Right arm, Buttocks, Right lower leg, Left lower leg     Bathing assist Assist Level: Moderate Assistance - Patient 50 - 74%     Upper Body Dressing/Undressing Upper body dressing   What is the patient wearing?: Pull over shirt    Upper body assist Assist Level: Moderate Assistance - Patient 50 - 74%    Lower Body Dressing/Undressing Lower body dressing      What is the patient wearing?: Incontinence brief, Pants     Lower body assist Assist for lower body dressing: Maximal Assistance - Patient 25 - 49%     Toileting Toileting Toileting Activity did not occur (Clothing management and hygiene only): N/A (no void or bm)  Toileting assist Assist for toileting: 2 Helpers     Transfers Chair/bed transfer  Transfers assist  Chair/bed transfer activity did not occur: Safety/medical concerns  Chair/bed transfer assist level: Moderate Assistance - Patient 50 - 74%     Locomotion Ambulation   Ambulation assist   Ambulation activity did not occur: Safety/medical concerns          Walk 10 feet activity   Assist  Walk 10 feet activity did not occur: Safety/medical concerns        Walk 50 feet activity   Assist Walk 50 feet with 2 turns activity did not occur: Safety/medical concerns         Walk 150 feet activity   Assist Walk 150 feet activity did not occur: Safety/medical concerns         Walk 10 feet on uneven surface  activity   Assist  Walk 10 feet on uneven surfaces activity did not occur: Safety/medical concerns         Wheelchair     Assist Is the patient using a wheelchair?: Yes Type of Wheelchair: Manual Wheelchair activity did not occur: Safety/medical concerns         Wheelchair 50 feet with 2 turns activity    Assist    Wheelchair 50 feet with 2 turns activity did not occur: Safety/medical concerns       Wheelchair 150 feet activity     Assist  Wheelchair 150 feet activity did not occur: Safety/medical concerns       Blood pressure (!) 136/95, pulse 71, temperature 98.6 F (37 C), temperature source Oral, resp. rate 16, height 4\' 11"  (1.499 m), weight 74.2 kg, last menstrual period 06/22/2022, SpO2 100 %.    Medical Problem List and Plan: 1. 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  Antithrombotics: -DVT/anticoagulation:  Pharmaceutical: Eliquis 5mg  BID             -antiplatelet therapy: N/A 3.Back pain/Pain Management:  Tylenol prn. Oxycodone making her nausea worse and ineffective--changed to MSIR 15mg  q6h PRN prior to d/c from acute, reordered here  4. Mood/Behavior/Sleep: LCSW to follow for evaluation and support.              -antipsychotic agents: N/A -pt reports chronic anxiety at baseline. Expresses that she's depressed as well currently.              -took atarax at home 10mg  tid prn. Will resume.             -begin lexapro 5mg  qhs             -neuropsych eval -10/02/22 started Trazodone 25mg  QHS and Hydroxyzine 25mg  QHS for now, monitor for effect or oversedation; left Hydroxyzine 10mg  TID PRN dosing for anxiety in place  -10/08/22 not sleeping well; added melatonin 5mg , monitor for improvement  5. Neuropsych/cognition: This patient is not capable of making decisions  on her own behalf. 6. Skin/Wound Care:  Routine pressure relief  measures.  7. Fluids/Electrolytes/Nutrition: Monitor I/O. Monitor routine labs weekly, next 10/10/22 8. Acute on chronic combined CHF: HH diet.  -Monitor weights daily and  for signs of overload. -continue Torsemide 40mg  QD (>>10mg  QD), Losartan 50mg  QD, Spironolactone 25mg  QD, Jardiance 10mg  QD (>>25mg  QD), and Metoprolol 25mg  BID  -Weight reviewed 3/15 and is significantly downtrending, asked RN to reweigh and discussed with RN what patient has been eating, decrease torsemide to 10mg . Monitor weights daily.  Increase torsemide back to 20mg  daily Filed Weights   10/10/22 0457 10/11/22 0500 10/12/22 0505  Weight: 73 kg 72.3 kg 74.2 kg   Vitals:   10/09/22 1532 10/09/22 1905 10/09/22 2105 10/10/22 0317  BP: 110/82 99/72 99/86  (!) 122/90   10/10/22 0327 10/10/22 1232 10/10/22 1926 10/10/22 2147  BP: (!) 108/96 (!) 123/93 (!) 122/91 115/89   10/11/22 0541 10/11/22 1346 10/11/22 2055 10/12/22 0504  BP: 120/86 107/81 119/86 (!) 136/95    9. T2DM: Hgb A1C-10.8  (improving >15 @ 11/23). Was on Lantus 15u w/ novolog 12 units with supper>>not restarted             --monitor BS ac/hs and use SSI for elevated BS             --on SSI, Jardiance 10mg  QD -10/02/22 CBGs well controlled with 2-3U of novolog use per meal so far, monitor for trends  -3/13 increase Jardiance to 25mg   -10/09/22 CBGs stable, mostly fair control, monitor CBG (last 3)  Recent Labs    10/11/22 1658 10/11/22 2058 10/12/22 0623  GLUCAP 159* 210* 172*    10. Severe obesity: Educate patient on diet, compliance and exercise to help promote health and mobility. Magnesium reviewed and normal.  11. Acute on Chronic renal failure/AKI: Diuretic held. Cr improved 3/13 but worsened 3/18. Decrease torsemide to 10mg . Monitor weekly. Discussed with patient. Weight continues to be stable 12. Hyperlipidemia: continue lipitor 80mg  QHS  13. Decreased initiation: increase  amantadine to 200mg  daily, continue, appears to be helping 14. Vitamin D insufficiency: 21.41 on 10/04/22, continue ergocalciferol 50,000U once per week for 7 weeks. Daily supplement d/ced 15. Bilateral lower extremity throbbing: -10/09/22 SCDs need adjusting, asked nursing to assist; added Robaxin 500mg  q6h PRN for leg spasms [place] reviewed and negative for clots, asking nursing to check whether SCDs are working, continue SCDs 16. Constipation: increased Miralax 17g to BID, add colace 100mg  QD; monitor for effect.  messaged nursing regarding when last BM was    LOS: 11 days A FACE TO FACE EVALUATION WAS PERFORMED  [name] 10/12/2022, 10:25 AM

## 2022-10-12 NOTE — Progress Notes (Signed)
Speech Language Pathology Daily Session Note  Patient Details  Name: [name] MRN: [id number] Date of Birth: 1976-02-01  Today's Date: 10/12/2022 SLP Individual Time: 0800-0900 SLP Individual Time Calculation (min): 60 min  Short Term Goals: Week 2: Patient will complete medication management tasks with min-to-mod A verbal cues to achieve 70% accuracy Week 2: Patient will complete problem solving tasks with awareness of errors given min-to-mod A verbal cues to ID and repair Week 2: Pt will trial regular textures with functional pharyngeal and no overt s/sx concering for airway intrusion with Mod I for implementation of safe swallowing strategies.  Week 2: Pt will recall novel, functional information 15 minute delay with 80% acc with mod verbal and visual cues  Skilled Therapeutic Interventions:   Pt was seen in am to address dysphagia management and cognitive re- training. Pt received supine in bed. Assigned CNA assisted this SLP in transferring pt to [facility] for breakfast meal. Pt was presented with current Dys 3 diet and thin liquids. She administered moderate sized boluses with mildly prolonged but functional mastication. As session progressed, pt noted with pocketing in L buccal cavity and anterior spillage to L side. Pt was responsive to verbal cues for lingual sweep with cues able to be faded to mod I. Pt's nurse present during session and plan developed to trial meds whole. Pt tolerated meds whole in puree, one at a time. Trials of meds whole with liquid wash requiring additional effort with pt inability to clear bolus. SLP recommending meds whole in puree, one at a time. Assigned nurse notified of change and diet updated on white board. In additional minutes of session, SLP addressed use of compensatory strategies for recall. Instruction for pt to utilize memory book to facilitate recall of SLP recommendations. Pt wrote what was done during today's ST session as well as recommendation for  lingual sweeps to L side intermittently throughout meal. Pt was left alert and seated upright in WC with call button within reach. SLP to continue POC.   Pain  No pain.  Therapy/Group: Individual Therapy  Colin Benton 10/12/2022, 8:55 AM

## 2022-10-12 NOTE — Progress Notes (Signed)
Physical Therapy Session Note  Patient Details  Name: [name] MRN: [id number] Date of Birth: 02-Jun-1976  Today's Date: 10/12/2022 PT Individual Time: 1130-1200 + 1400-1455 PT Individual Time Calculation (min): 30 min  + 55 min  Short Term Goals: Week 2:  PT Short Term Goal 1 (Week 2): Pt will complete bed mobility with modA PT Short Term Goal 2 (Week 2): Pt will complete bed<>chair transfers with modA and LRAD PT Short Term Goal 3 (Week 2): Pt will ambulate 14ft with maxA and LRAD  Skilled Therapeutic Interventions/Progress Updates:      1st session: Pt sitting in w/c to start with her mother present at bedside. When opened up discussion regarding DC planning - mother reports plan is to DC to a "facility" but patient reports the plan is to go home. Will continue to facilitate DC planning to better understand follow up care and DME needs. Pt denies pain during treatment session - continues to have flat affect, reports that she's had a "rough day" but unable to further explain. Distraction and redirection during treatment to improve mood.   Transported to main rehab gym for time management.   Stand step transfer with modA with PT facilitating lateral weight shift and blocking LLE during stance - improved motor planning and sequencing while completing while transferring to her stronger R side. Sit's unsupported at [facility] with supervision. Focused remainder of session on NMR and standing balance. Sit<>stands with minA while blocking LLE to prevent buckling. Patient compensating with very limited weightbearing through LLE. Worked on forced weight bearing and weight shifting for LLE NMR. Standing ball toss with PT providing minA for stabilizing and ball toss bias towards her L side to promote attention. Assisted to back to her w/c with stand step transfer towards her weaker R side - some hip flexion noted during swing but continues to require blocking - significant difficulty with stepping  backwards to the chair.  Returned to her room and patient concluded session seated in w/c with 1/2 lap tray on, safety belt alarm on, call bell in lap.   2nd session: Direct hand off of care from NT with patient on toilet with Stedy used to transfer her. Pt continent of bladder - used Stedy for time to safely transfer patient back to her w/c - able to rise to stand with minA while pulling herself up to stand from low toilet height. Needing totalA for managing clothes in standing and pulling over hips. NT made aware of continent episode to chart.  Transported to day room rehab gym for time. Focused majority of session on NMR for LUE with [employer] H200 wireless to address gross grasp and wrist extension. Intensity set to 53mA and 20mA respectively and worked on reaching to thin cones with assist for supporting elbow/shoulder. Patient exhibiting fair response with AROM for targeted functional task - skin c/d/I.   Remainder of session used [employer] to work on reciprocal stepping, posterior chain activation, and motor planning. Patient needing assist for sequencing and activation - tends to push with both LE at the same time or inhibits the LLE with the stronger RLE. Decreased attention in busy gym environment also impacting. Resistance minimal at 90cm/sec and completed x9 minutes total.   Returned to her room and assisted back to her bed with stand step transfer, PT blocking LLE and facilitating lateral weight shifting and instructional cues for sequencing. MaxA for bed mobility and patient positioned with all needs met. Alarm on.   Therapy Documentation Precautions:  Precautions Precautions: Fall Precaution Comments: Left hemiparesis Required Braces or Orthoses: Other Brace Other Brace: L Resting hand splint and L PRAFO Restrictions Weight Bearing Restrictions: No General:     Therapy/Group: Individual Therapy  Sinaya Minogue P Janeane Cozart PT 10/12/2022, 7:45 AM

## 2022-10-12 NOTE — Patient Care Conference (Signed)
Inpatient RehabilitationTeam Conference and Plan of Care Update Date: 10/12/2022   Time: 11:14 AM    Patient Name: [name]      Medical Record Number: [id number]  Date of Birth: 1976-05-10 Sex: Female         Room/Bed: 4M06C/4M06C-01 Payor Info: Payor: MEDICARE / Plan: MEDICARE PART A AND B / Product Type: *No Product type* /    Admit Date/Time:  10/01/2022  1:41 PM  Primary Diagnosis:  Acute ischemic right MCA stroke [facility])  Hospital Problems: Principal Problem:   Acute ischemic right MCA stroke ([place]) Active Problems:   Poorly controlled type 2 diabetes mellitus ([place])   Hyperlipidemia associated with type 2 diabetes mellitus ([facility])   Chronic nausea   Chronic combined systolic and diastolic heart failure (H[facility])   Paroxysmal atrial fibrillation (H[facility])   Severe obesity (BMI >= 40) (H[facility])   Chronic pain   Insomnia   Gaze preference w/left inattention   Left hemiplegia (H[facility])   Mild episode of recurrent major depressive disorder [facility])    Expected Discharge Date: Expected Discharge Date: 10/31/22  Team Members Present: Physician leading conference: Dr. Leeroy Cha Social Worker Present: Ovidio Kin, LCSW Nurse Present: Dorien Chihuahua, RN PT Present: Ginnie Smart, PT OT Present: Willeen Cass, OT SLP Present: Helaine Chess, SLP PPS Coordinator present : Gunnar Fusi, SLP     Current Status/Progress Goal Weekly Team Focus  Bowel/Bladder   Patient is incontinent for B&B.   Patient to be continent.   Frequnt Toileting.    Swallow/Nutrition/ Hydration   Dys. 3 textures with thin liquids, Supervision A   Supervision  trials of regular textures    ADL's   max/mod assist - significant improvement   Min assist   postural control, attention to left, sustained to selective attention, stand balance/tolerance, functional mobility    Mobility   maxA bed mobility, modA squat<>pivot transfers, min/modA standing, +2 maxA ambulating using hand rail in hallway   CGA to  minA  Functional transfers, L side NMR, gait training    Communication                Safety/Cognition/ Behavioral Observations  Min A   Sup A   attention, recall, awareness, and problem solving    Pain   N/A            Skin   N/A             Discharge Planning:  Family here daily to support pt, pt making progress but still very flat. Neuro-psych seeing for support. Home with either daughter or Dad, family still working out a Leisure centre manager Discussion: Patient with spasms/DVT; SCDs in use. MD adjusting medication, Jardiance for DM management. Patient making slow steady progress; working on left neuro-re-education, gait training, postural control, sustained attention, problem solving, awareness and memory.  Patient on target to meet rehab goals: yes, currently needs max assist for bed mobility and mod assist for sit - stand. Able to ambulate up to 30' with +2 assist but needs mod - max assist for postural control. Maintained on a D-3, thin liquid diet with supervision. Goals for discharge set for CGA- min assist overall.  *See Care Plan and progress notes for long and short-term goals.   Revisions to Treatment Plan:  Regular diet texture trials Memory notebook   Teaching Needs: Safety, medications, dietary modification recommendations, tranfers, toileting, etc.   Current Barriers to Discharge: Decreased caregiver support and Home enviroment access/layout  Possible Resolutions  to Barriers: Family education Ramp for entry to home recommended    Medical Summary Current Status: flat affect, decreased initation, obesity, congestive heart failure, lower extremity cramps, constipation  Barriers to Discharge: Medical stability  Barriers to Discharge Comments: flat affect, decreased initation, obesity, congestive heart failure, lower extremity cramps, constipation Possible Resolutions to [employer] Focus: continue amantadine 200mg , provide dietary education, monitor  daily weights, increase torsemide to 20mg  daily, continue SCDs, continue senna-docusate   Continued Need for Acute Rehabilitation Level of Care: The patient requires daily medical management by a physician with specialized training in physical medicine and rehabilitation for the following reasons: Direction of a multidisciplinary physical rehabilitation program to maximize functional independence : Yes Medical management of patient stability for increased activity during participation in an intensive rehabilitation regime.: Yes Analysis of laboratory values and/or radiology reports with any subsequent need for medication adjustment and/or medical intervention. : Yes   I attest that I was present, lead the team conference, and concur with the assessment and plan of the team.   Dorien Chihuahua B 10/12/2022, 3:56 PM

## 2022-10-12 NOTE — Progress Notes (Signed)
Occupational Therapy Session Note  Patient Details  Name: [name] MRN: [id number] Date of Birth: 01-May-1976  Today's Date: 10/12/2022 OT Individual Time: [id number] Individual Time Calculation (min): 45 min    Short Term Goals: Week 2:  OT Short Term Goal 1 (Week 2): Patient will complete squat pivot transfer to wheelchair to right or left with min assist OT Short Term Goal 2 (Week 2): Patient will don pull over shirt with min assist OT Short Term Goal 3 (Week 2): Patient will stand sufficiently for toileting hygiene or clothing management with no more than min assist OT Short Term Goal 4 (Week 2): Patient will visually scan to left of midline to locate needed ADLitems without cueing  Skilled Therapeutic Interventions/Progress Updates:     Pt received sitting up in wc presenting with flat affect receptive to skilled OT session. Pt reporting pain in LLE upon OT arrival- requested pain medications from Rn and OT providing repositioning, rest breaks, and therapeutic support to improve participation and decrease pain. Pt transported total A to therapy gym in wc for time management and energy conservation. Worked on completing squat pivot transfers wc<>EOM to increase safety and independence in transfers. Transfers completed to both L and R side this session, however Pt noticeably more safe completing to L side with weightbearing facilitated during transfer for L NMR. Pt completed series of dynamics sitting balance activities with LUE weight bearing facilitated during tasks to increase LUE motor return. Pt completed lateral leans to L side with tapping and tactile cues provided to facilitated increase muscle activation with Pt able to lift trunk with CGA to min A. Placed items on Pt L side to increase visual attention with Pt instructed to retrieve items with RUE while weight bearing through LUE. Pt completed BITs visual scanning, single target/time paced activity seated in wc to work on visual  scanning and attending to L visual field. Pt able to complete with 71.43% accuracy with reaction time of 2.12 sec with increased accuracy on R>L. Pt required mod Vb cueing to scan to L side and attend to targets when presented in L visual field. Pt transported back to room total A in wc and was left resting in wc with call bell in reach, seat belt alarm on, and all needs met.   Therapy Documentation Precautions:  Precautions Precautions: Fall Precaution Comments: Left hemiparesis Required Braces or Orthoses: Other Brace Other Brace: L Resting hand splint and L PRAFO Restrictions Weight Bearing Restrictions: No  Therapy/Group: Individual Therapy  Janey Genta 10/12/2022, 10:24 AM

## 2022-10-13 LAB — GLUCOSE, CAPILLARY
Glucose-Capillary: 95 mg/dL (ref 70–99)
Glucose-Capillary: 105 mg/dL — ABNORMAL HIGH (ref 70–99)
Glucose-Capillary: 139 mg/dL — ABNORMAL HIGH (ref 70–99)
Glucose-Capillary: 202 mg/dL — ABNORMAL HIGH (ref 70–99)

## 2022-10-13 LAB — BASIC METABOLIC PANEL
Anion gap: 9 (ref 5–15)
BUN: 12 mg/dL (ref 6–20)
CO2: 29 mmol/L (ref 22–32)
Calcium: 9.1 mg/dL (ref 8.9–10.3)
Chloride: 99 mmol/L (ref 98–111)
Creatinine, Ser: 1.36 mg/dL — ABNORMAL HIGH (ref 0.44–1.00)
GFR, Estimated: 49 mL/min — ABNORMAL LOW (ref >60)
Glucose, Bld: 112 mg/dL — ABNORMAL HIGH (ref 70–99)
Potassium: 4.3 mmol/L (ref 3.5–5.1)
Sodium: 137 mmol/L (ref 135–145)

## 2022-10-13 NOTE — Progress Notes (Signed)
Physical Therapy Session Note  Patient Details  Name: [name] MRN: [id number] Date of Birth: 02/06/1976  Today's Date: 10/13/2022 PT Individual Time: 1302-1400 PT Individual Time Calculation (min): 58 min   Short Term Goals: Week 2:  PT Short Term Goal 1 (Week 2): Pt will complete bed mobility with modA PT Short Term Goal 2 (Week 2): Pt will complete bed<>chair transfers with modA and LRAD PT Short Term Goal 3 (Week 2): Pt will ambulate 40ft with maxA and LRAD  Skilled Therapeutic Interventions/Progress Updates:    Pt presents in room in [facility], reports fatigue but agreeable to PT with encouragement. Pt denies pain this session. Pt transported in [facility] to main gym for time management. Session focused on bed mobility, transfer training, and NMR for LLE weightbearing and muscular reeducation, midline orientation, core strengthening, righting reactions in sitting, and reaching outside BOS as described below. Pt completes squat pivot transfers to R/L from WC<>mat with education and verbalization on sequencing, head hips relationship, and hand placement with pt demonstrating understanding and improved initiation of transfer with pt requiring min/mod assist for transfer. Pt completes sit<>stand at [facility] with therapist blocking L knee with min assist. Pt requests to use restroom during session with pt transported back to room dependently in [facility] and pt requesting to use sara stedy secondary to urgency. Pt completes sit<>stand pulling upright on bar with min assist and verbal cueing for postural correction and midline orientation. Pt then rolled into bathroom and requires total assist for doffing pants/brief in standing, completes sitting to toilet with RUE on sara stedy with min assist for control. Pt with incontinent episode during doffing pants, indicated in chart. Pt then rolled in sara stedy to sink where pt provided with cues to complete hand hygiene in standing at [place] stedy with cues for midline  orientation, moderate assist provided for pt to wash L hand in standing, pt reaching outside BOS for sink faucets and encouraged to reach across midline to L side to facilitate weightbearing on LLE for soap, paper towels, and throwing away paper towels in trash can. Pt then positioned in [place] stedy over bed to transfer to sitting EOB. Pt completes scooting posteriorly with improved head hip relationship noted for scooting L hip posteriorly. Pt with education on sit to supine for compensatory strategy to bring LLE into bed with pt cont to require max assist for BLEs into bed and repositioning once supine, requires max assist x2 for scooting towards HOB. Pt instructed in how to reposition in bed with bridging with pt demo bridging to L and towards [facility] with mod assist to maintain LLE in flexed position. Pt then completes x5 bridges for mass practice to facilitate carryover into bed mobility with pt demo good gluteal clearance for task. Pt remains supine in bed with all needs within reach, call light in place, bed alarm activated and daughter present at bedside at end of session.  NMR: Seated EOM with mirror positioned in front: -self correction in mirror, verbal and tactile cues for positioning -lateral lean to L elbow x5, L foot on floor R foot support on 4" step to facilitate LLE weightbearing -lateral reach across midline anterior and L x10, L foot on floor R foot support on 4" step to facilitate LLE weightbearing Standing with mirror positioned in front - sit<>stand min assist blocking L knee,standing mod assist pt with L lateral lean with verbal and visual cueing for head hips relationship to correct to midline with pt able to maintain with  min assist ~15 seconds, completed to facilitate weightbearing on LLE  Therapy Documentation Precautions:  Precautions Precautions: Fall Precaution Comments: Left hemiparesis Required Braces or Orthoses: Other Brace Other Brace: L Resting hand splint and L  PRAFO Restrictions Weight Bearing Restrictions: No    Therapy/Group: Individual Therapy  Lorna Dibble PT, DPT 10/13/2022, 2:27 PM

## 2022-10-13 NOTE — Progress Notes (Signed)
SLP Cancellation Note  Patient Details Name: CHERYLL CARAWAN MRN: [id number] DOB: 05/20/1976   Cancelled treatment:      Attempted to see pt for scheduled ST intervention; however, pt unwilling to participate due to being on an important phone call that could not wait. Will continue efforts to make-up missed time, as able. Pt missed 60 minutes of skilled intervention.     Maejor Erven A Linkoln Alkire 10/13/2022, 11:06 AM

## 2022-10-13 NOTE — Progress Notes (Signed)
Occupational Therapy Session Note  Patient Details  Name: MAIZIE UPTHEGROVE MRN: [id number] Date of Birth: 1976-04-06  Today's Date: 10/13/2022 OT Individual Time: 0920-1000 OT Individual Time Calculation (min): 40 min    Short Term Goals: Week 1:  OT Short Term Goal 1 (Week 1): Pt will complete Sit to stand transfer with Mod A + LRAD, in preparation for ADL participation. OT Short Term Goal 1 - Progress (Week 1): Met OT Short Term Goal 2 (Week 1): Pt will complete squat-pivot transfer from EOB<>BSC with Mod A + LRAD. OT Short Term Goal 2 - Progress (Week 1): Met OT Short Term Goal 3 (Week 1): Pt will complete LB ADLs with Max A + LRAD. OT Short Term Goal 3 - Progress (Week 1): Met  Skilled Therapeutic Interventions/Progress Updates:     Pt received in bed with no pain.  ADL: Pt completes sup>EOB with MAX A and cuing for head hips relationship for reciprocal scooting. Pt completes squat pivot transfer with MOD A+2. LB dressing at sit to stand level at sink with increased A provided for crossing into figure 4 and use of stool overall remains max-total A for dressing componrnts. Worked on weight shifting at sink with tunring LLE on instance  Therapeutic activity Spent increased time educating about POC, schedule, and different services provided. Went through schedule for today since pt did not notice she was looking at incorrect date. Pt with no further questions and agreeable to tx  Pt left at end of session in bed with exit alarm on, call light in reach and all needs met   Therapy Documentation Precautions:  Precautions Precautions: Fall Precaution Comments: Left hemiparesis Required Braces or Orthoses: Other Brace Other Brace: L Resting hand splint and L PRAFO Restrictions Weight Bearing Restrictions: No  Therapy/Group: Individual Therapy  Tonny Branch 10/13/2022, 6:41 AM

## 2022-10-13 NOTE — Progress Notes (Signed)
PROGRESS NOTE   Subjective/Complaints: Hypoglycemic to 68- insulin sliding scale d/ced, she is very happy about this She denies new complaints this morning Still flat, but very polite  ROS: + back and neck pain-improved, +decreased initiation as per therapy- improved. +throbbing in b/l lower extremities-ongoing +Constipation-improving. Denies CP, SOB, abd pain, n/v/d.    Objective:   No results found. No results for input(s): "WBC", "HGB", "HCT", "PLT" in the last 72 hours.   No results for input(s): "NA", "K", "CL", "CO2", "GLUCOSE", "BUN", "CREATININE", "CALCIUM" in the last 72 hours.    Intake/Output Summary (Last 24 hours) at 10/13/2022 1044 Last data filed at 10/13/2022 0850 Gross per 24 hour  Intake 437 ml  Output 450 ml  Net -13 ml        Physical Exam: Vital Signs Blood pressure 131/89, pulse 65, temperature 98.3 F (36.8 C), temperature source Oral, resp. rate 15, height 4\' 11"  (1.499 m), weight 74.2 kg, last menstrual period 06/22/2022, SpO2 100 %.  Constitutional: No distress . Vital signs reviewed. Obese. BMI 33.04 HEENT: NCAT, EOMI, oral membranes moist.  Neck: supple Cardiovascular: RRR without murmur. No JVD    Respiratory/Chest: CTA Bilaterally without wheezes or rales. Normal effort    GI/Abdomen: BS +, non-tender, non-distended Ext: no clubbing or cyanosis, 1-2+ BLE edema Psych: very flat, depressed, very polite and respectful and appreciative Skin: dry, warm MSK: weak in L arm, able to move R arm around in bed, full MsK exam not performed.  Neurologic: Pt less tired than before. Speech dysarthric, low volume. Language appears to be intact.    PRIOR EXAM: Neurologic: Right gaze preference but can follow to left to a lesser extent. No obvious field cut. Left central 7 and tongue deviation. LUE and LLE 0/5. Senses pain and LT in all 4's. No resting tone. DTR's 1+ throughout. Toes down left foot, no  clonus.    Assessment/Plan: 1. Functional deficits which require 3+ hours per day of interdisciplinary therapy in a comprehensive inpatient rehab setting. Physiatrist is providing close team supervision and 24 hour management of active medical problems listed below. Physiatrist and rehab team continue to assess barriers to discharge/monitor patient progress toward functional and medical goals  Care Tool:  Bathing    Body parts bathed by patient: Left arm, Chest, Abdomen, Front perineal area, Right upper leg, Left upper leg, Face   Body parts bathed by helper: Right arm, Buttocks, Right lower leg, Left lower leg     Bathing assist Assist Level: Moderate Assistance - Patient 50 - 74%     Upper Body Dressing/Undressing Upper body dressing   What is the patient wearing?: Pull over shirt    Upper body assist Assist Level: Moderate Assistance - Patient 50 - 74%    Lower Body Dressing/Undressing Lower body dressing      What is the patient wearing?: Incontinence brief, Pants     Lower body assist Assist for lower body dressing: Maximal Assistance - Patient 25 - 49%     Toileting Toileting Toileting Activity did not occur (Clothing management and hygiene only): N/A (no void or bm)  Toileting assist Assist for toileting: 2 Helpers  Transfers Chair/bed transfer  Transfers assist  Chair/bed transfer activity did not occur: Safety/medical concerns  Chair/bed transfer assist level: Moderate Assistance - Patient 50 - 74%     Locomotion Ambulation   Ambulation assist   Ambulation activity did not occur: Safety/medical concerns          Walk 10 feet activity   Assist  Walk 10 feet activity did not occur: Safety/medical concerns        Walk 50 feet activity   Assist Walk 50 feet with 2 turns activity did not occur: Safety/medical concerns         Walk 150 feet activity   Assist Walk 150 feet activity did not occur: Safety/medical concerns          Walk 10 feet on uneven surface  activity   Assist Walk 10 feet on uneven surfaces activity did not occur: Safety/medical concerns         Wheelchair     Assist Is the patient using a wheelchair?: Yes Type of Wheelchair: Manual Wheelchair activity did not occur: Safety/medical concerns         Wheelchair 50 feet with 2 turns activity    Assist    Wheelchair 50 feet with 2 turns activity did not occur: Safety/medical concerns       Wheelchair 150 feet activity     Assist  Wheelchair 150 feet activity did not occur: Safety/medical concerns       Blood pressure 131/89, pulse 65, temperature 98.3 F (36.8 C), temperature source Oral, resp. rate 15, height 4\' 11"  (1.499 m), weight 74.2 kg, last menstrual period 06/22/2022, SpO2 100 %.    Medical Problem List and Plan: 1. 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  Antithrombotics: -DVT/anticoagulation:  Pharmaceutical: Eliquis 5mg  BID             -antiplatelet therapy: N/A 3.Back pain/Pain Management:  Tylenol prn. Oxycodone making her nausea worse and ineffective--changed to MSIR 15mg  q6h PRN prior to d/c from acute, reordered here  4. Mood/Behavior/Sleep: LCSW to follow for evaluation and support.              -antipsychotic agents: N/A -pt reports chronic anxiety at baseline. Expresses that she's depressed as well currently.              -took atarax at home 10mg  tid prn. Will resume.             -begin lexapro 5mg  qhs             -neuropsych eval -10/02/22 started Trazodone 25mg  QHS and Hydroxyzine 25mg  QHS for now, monitor for effect or oversedation; left Hydroxyzine 10mg  TID PRN dosing for anxiety in place  -10/08/22 not sleeping well; added melatonin 5mg , monitor for improvement  5.  Neuropsych/cognition: This patient is not capable of making decisions on her own behalf. 6. Skin/Wound Care:  Routine pressure relief  measures.  7. Fluids/Electrolytes/Nutrition: Monitor I/O. Monitor routine labs weekly, next 10/10/22 8. Acute on chronic combined CHF: HH diet.  -Monitor weights daily and for signs of overload. Asked nursing to weight her today -continue Torsemide  40mg  QD (>>10mg  QD), Losartan 50mg  QD, Spironolactone 25mg  QD, Jardiance 10mg  QD (>>25mg  QD), and Metoprolol 25mg  BID  -Weight reviewed 3/15 and is significantly downtrending, asked RN to reweigh and discussed with RN what patient has been eating, decrease torsemide to 10mg . Monitor weights daily.  Increase torsemide back to 20mg  daily Filed Weights   10/10/22 0457 10/11/22 0500 10/12/22 0505  Weight: 73 kg 72.3 kg 74.2 kg   Vitals:   10/09/22 2105 10/10/22 0317 10/10/22 0327 10/10/22 1232  BP: 99/86 (!) 122/90 (!) 108/96 (!) 123/93   10/10/22 1926 10/10/22 2147 10/11/22 0541 10/11/22 1346  BP: (!) 122/91 115/89 120/86 107/81   10/11/22 2055 10/12/22 0504 10/12/22 1931 10/13/22 0511  BP: 119/86 (!) 136/95 118/83 131/89    9. T2DM: Hgb A1C-10.8  (improving >15 @ 11/23). Was on Lantus 15u w/ novolog 12 units with supper>>not restarted             --monitor BS ac/hs and use SSI for elevated BS             --on SSI, Jardiance 10mg  QD -10/02/22 CBGs well controlled with 2-3U of novolog use per meal so far, monitor for trends  -3/13 increase Jardiance to 25mg   -d/c ISS CBG (last 3)  Recent Labs    10/12/22 1710 10/12/22 2056 10/13/22 0603  GLUCAP 77 100* 95    10. Severe obesity: Educate patient on diet, compliance and exercise to help promote health and mobility. Magnesium reviewed and normal.  11. Acute on Chronic renal failure/AKI: Diuretic held. Cr improved 3/13 but worsened 3/18. Decrease torsemide to 10mg . Monitor weekly. Discussed with patient. Weight continues to be stable. Repeat creatinine today 12.  Hyperlipidemia: continue lipitor 80mg  QHS  13. Decreased initiation: increase amantadine to 200mg  daily, continue, appears to be helping, discussed improvements with therapy 14. Vitamin D insufficiency: 21.41 on 10/04/22, continue ergocalciferol 50,000U once per week for 7 weeks. Daily supplement d/ced 15. Bilateral lower extremity throbbing: -10/09/22 SCDs need adjusting, asked nursing to assist; added Robaxin 500mg  q6h PRN for leg spasms [place] reviewed and negative for clots, asking nursing to check whether SCDs are working, continue SCDs 16. Constipation: increased Miralax 17g to BID, add colace 100mg  QD; monitor for effect.  messaged nursing regarding when last BM was    LOS: 12 days A FACE TO FACE EVALUATION WAS PERFORMED  [name] 10/13/2022, 10:44 AM

## 2022-10-13 NOTE — Progress Notes (Signed)
Physical Therapy Session Note  Patient Details  Name: [name] MRN: [id number] Date of Birth: July 03, 1976  Today's Date: 10/13/2022 PT Individual Time: 1115-1200 PT Individual Time Calculation (min): 45 min   Short Term Goals: Week 2:  PT Short Term Goal 1 (Week 2): Pt will complete bed mobility with modA PT Short Term Goal 2 (Week 2): Pt will complete bed<>chair transfers with modA and LRAD PT Short Term Goal 3 (Week 2): Pt will ambulate 90ft with maxA and LRAD  Skilled Therapeutic Interventions/Progress Updates:      Pt siting in w/c to start - agreeable to therapy despite flat affect. Polite and pleasant throughout session - no reports of pain.  Transported to day room rehab gym for time management. Focused session on NMR gait training with LIteGait overground using partial BWS and BUE support on LiteGait. Ace wrapped L foot for DF assist and also her L hand to LiteGait to promote weight bearing and support trunk in standing.   Ambulated ~27ft + ~26ft with +2 assist in above described setup. PT on stool to facilitate hemi gait for her LLE. Patient needing totalA (pt <25%) for managing all aspects of gait cycle for LLE (swing, placement, stance phase). Also needing assist for facilitating lateral weight shift throughout gait cycle as she leans heavily to the L.  Initially, she demonstrates step-to pattern with very decreased weight acceptance on LLE - pt reports gait is difficulty 2/2 sensory deficits on her L side. During sitting rest break, discussed strategies to progress towards step-through gait and increased stance time on L - patient with significant improvement after discussion, still requiring similar amount of assist as above.  Returned to her room and patient concluded session seated in w/c with all needs met . NT in room to assist pt with toileting.    Therapy Documentation Precautions:  Precautions Precautions: Fall Precaution Comments: Left hemiparesis Required  Braces or Orthoses: Other Brace Other Brace: L Resting hand splint and L PRAFO Restrictions Weight Bearing Restrictions: No General:    Therapy/Group: Individual Therapy  [name] 10/13/2022, 7:35 AM

## 2022-10-14 LAB — GLUCOSE, CAPILLARY
Glucose-Capillary: 131 mg/dL — ABNORMAL HIGH (ref 70–99)
Glucose-Capillary: 172 mg/dL — ABNORMAL HIGH (ref 70–99)
Glucose-Capillary: 133 mg/dL — ABNORMAL HIGH (ref 70–99)
Glucose-Capillary: 154 mg/dL — ABNORMAL HIGH (ref 70–99)

## 2022-10-14 NOTE — Progress Notes (Signed)
PROGRESS NOTE   Subjective/Complaints: CBGs now 95-202, Cr reviewed and is 1.36, will maintain off ISS given hypoglycemic episodes, will not add metformin given creatinine  ROS: + back and neck pain-improved, +decreased initiation as per therapy- improved. +throbbing in b/l lower extremities-ongoing +Constipation-improving. Denies CP, SOB, abd pain, n/v/d.    Objective:   No results found. No results for input(s): "WBC", "HGB", "HCT", "PLT" in the last 72 hours.   Recent Labs    10/13/22 1107  NA 137  K 4.3  CL 99  CO2 29  GLUCOSE 112*  BUN 12  CREATININE 1.36*  CALCIUM 9.1      Intake/Output Summary (Last 24 hours) at 10/14/2022 1120 Last data filed at 10/14/2022 0906 Gross per 24 hour  Intake 338 ml  Output 825 ml  Net -487 ml        Physical Exam: Vital Signs Blood pressure (!) 123/94, pulse 67, temperature 97.9 F (36.6 C), resp. rate 17, height 4\' 11"  (1.499 m), weight 75.3 kg, last menstrual period 06/22/2022, SpO2 100 %.  Constitutional: No distress . Vital signs reviewed. Obese. BMI 33.53 HEENT: NCAT, EOMI, oral membranes moist.  Neck: supple Cardiovascular: RRR without murmur. No JVD    Respiratory/Chest: CTA Bilaterally without wheezes or rales. Normal effort    GI/Abdomen: BS +, non-tender, non-distended Ext: no clubbing or cyanosis, 1-2+ BLE edema Psych: very flat, depressed, very polite and respectful and appreciative Skin: dry, warm MSK: weak in L arm, able to move R arm around in bed, full MsK exam not performed.  Neurologic: Pt less tired than before. Speech dysarthric, low volume. Language appears to be intact.    PRIOR EXAM: Neurologic: Right gaze preference but can follow to left to a lesser extent. No obvious field cut. Left central 7 and tongue deviation. LUE and LLE 0/5. Senses pain and LT in all 4's. No resting tone. DTR's 1+ throughout. Toes down left foot, no clonus.     Assessment/Plan: 1. Functional deficits which require 3+ hours per day of interdisciplinary therapy in a comprehensive inpatient rehab setting. Physiatrist is providing close team supervision and 24 hour management of active medical problems listed below. Physiatrist and rehab team continue to assess barriers to discharge/monitor patient progress toward functional and medical goals  Care Tool:  Bathing    Body parts bathed by patient: Left arm, Chest, Abdomen, Front perineal area, Right upper leg, Left upper leg, Face   Body parts bathed by helper: Right arm, Buttocks, Right lower leg, Left lower leg     Bathing assist Assist Level: Moderate Assistance - Patient 50 - 74%     Upper Body Dressing/Undressing Upper body dressing   What is the patient wearing?: Pull over shirt    Upper body assist Assist Level: Moderate Assistance - Patient 50 - 74%    Lower Body Dressing/Undressing Lower body dressing      What is the patient wearing?: Incontinence brief, Pants     Lower body assist Assist for lower body dressing: Maximal Assistance - Patient 25 - 49%     Toileting Toileting Toileting Activity did not occur (Clothing management and hygiene only): N/A (no void or bm)  Toileting assist Assist for toileting: 2 Helpers     Transfers Chair/bed transfer  Transfers assist  Chair/bed transfer activity did not occur: Safety/medical concerns  Chair/bed transfer assist level: Moderate Assistance - Patient 50 - 74%     Locomotion Ambulation   Ambulation assist   Ambulation activity did not occur: Safety/medical concerns          Walk 10 feet activity   Assist  Walk 10 feet activity did not occur: Safety/medical concerns        Walk 50 feet activity   Assist Walk 50 feet with 2 turns activity did not occur: Safety/medical concerns         Walk 150 feet activity   Assist Walk 150 feet activity did not occur: Safety/medical concerns          Walk 10 feet on uneven surface  activity   Assist Walk 10 feet on uneven surfaces activity did not occur: Safety/medical concerns         Wheelchair     Assist Is the patient using a wheelchair?: Yes Type of Wheelchair: Manual Wheelchair activity did not occur: Safety/medical concerns         Wheelchair 50 feet with 2 turns activity    Assist    Wheelchair 50 feet with 2 turns activity did not occur: Safety/medical concerns       Wheelchair 150 feet activity     Assist  Wheelchair 150 feet activity did not occur: Safety/medical concerns       Blood pressure (!) 123/94, pulse 67, temperature 97.9 F (36.6 C), resp. rate 17, height 4\' 11"  (1.499 m), weight 75.3 kg, last menstrual period 06/22/2022, SpO2 100 %.    Medical Problem List and Plan: 1. 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  Antithrombotics: -DVT/anticoagulation:  Pharmaceutical: Eliquis 5mg  BID             -antiplatelet therapy: N/A 3.Back pain/Pain Management:  Tylenol prn. Oxycodone making her nausea worse and ineffective--changed to MSIR 15mg  q6h PRN prior to d/c from acute, reordered here  4. Mood/Behavior/Sleep: LCSW to follow for evaluation and support.              -antipsychotic agents: N/A -pt reports chronic anxiety at baseline. Expresses that she's depressed as well currently.              -took atarax at home 10mg  tid prn. Will resume.             -begin lexapro 5mg  qhs             -neuropsych eval -10/02/22 started Trazodone 25mg  QHS and Hydroxyzine 25mg  QHS for now, monitor for effect or oversedation; left Hydroxyzine 10mg  TID PRN dosing for anxiety in place  -10/08/22 not sleeping well; added melatonin 5mg , monitor for improvement  5. Neuropsych/cognition: This patient  is not capable of making decisions on her own behalf. 6. Skin/Wound Care:  Routine pressure relief  measures.  7. Fluids/Electrolytes/Nutrition: Monitor I/O. Monitor routine labs weekly, next 10/10/22 8. Acute on chronic combined CHF: HH diet.  -Monitor weights daily and for signs of  overload. Asked nursing to weight her today -continue Torsemide 40mg  QD (>>10mg  QD), Losartan 50mg  QD, Spironolactone 25mg  QD, Jardiance 10mg  QD (>>25mg  QD), and Metoprolol 25mg  BID  -Weight reviewed 3/15 and is significantly downtrending, asked RN to reweigh and discussed with RN what patient has been eating, decrease torsemide to 10mg . Monitor weights daily.  Increase torsemide back to 20mg  daily. Will not increase further despite slight weight increase given slight rise in creatinine Filed Weights   10/11/22 0500 10/12/22 0505 10/14/22 0919  Weight: 72.3 kg 74.2 kg 75.3 kg   Vitals:   10/10/22 1232 10/10/22 1926 10/10/22 2147 10/11/22 0541  BP: (!) 123/93 (!) 122/91 115/89 120/86   10/11/22 1346 10/11/22 2055 10/12/22 0504 10/12/22 1931  BP: 107/81 119/86 (!) 136/95 118/83   10/13/22 0511 10/13/22 1445 10/13/22 1940 10/14/22 0608  BP: 131/89 117/83 (!) 129/95 (!) 123/94    9. T2DM: Hgb A1C-10.8  (improving >15 @ 11/23). Was on Lantus 15u w/ novolog 12 units with supper>>not restarted             --monitor BS ac/hs and use SSI for elevated BS             --on SSI, Jardiance 10mg  QD -10/02/22 CBGs well controlled with 2-3U of novolog use per meal so far, monitor for trends  -3/13 increase Jardiance to 25mg   -d/c ISS CBG (last 3)  Recent Labs    10/13/22 1719 10/13/22 2043 10/14/22 0639  GLUCAP 139* 202* 131*    10. Severe obesity: Educate patient on diet, compliance and exercise to help promote health and mobility. Magnesium reviewed and normal.  11. Acute on Chronic renal failure/AKI: Diuretic held. Cr improved 3/13 but worsened 3/18. Decrease torsemide to 10mg . Monitor weekly. Discussed with  patient. Weight continues to be stable. Repeat creatinine today 12. Hyperlipidemia: continue lipitor 80mg  QHS  13. Decreased initiation: increase amantadine to 200mg  daily, continue, appears to be helping, discussed improvements with therapy 14. Vitamin D insufficiency: 21.41 on 10/04/22, continue ergocalciferol 50,000U once per week for 7 weeks. Daily supplement d/ced 15. Bilateral lower extremity throbbing: -10/09/22 SCDs need adjusting, asked nursing to assist; added Robaxin 500mg  q6h PRN for leg spasms [place] reviewed and negative for clots, asking nursing to check whether SCDs are working, continue SCDs 16. Constipation: continue Miralax 17g to BID, add colace 100mg  QD; monitor for effect.  messaged nursing regarding when last BM was    LOS: 13 days A FACE TO FACE EVALUATION WAS PERFORMED  Martha Clan P Ashlan Dignan 10/14/2022, 11:20 AM

## 2022-10-14 NOTE — Progress Notes (Signed)
Occupational Therapy Session Note  Patient Details  Name: [name] MRN: [id number] Date of Birth: 25-Jul-1976  Today's Date: 10/14/2022 OT Individual Time: [id number] OT Individual Time Calculation (min): 80 min    Short Term Goals: Week 2:  OT Short Term Goal 1 (Week 2): Patient will complete squat pivot transfer to wheelchair to right or left with min assist OT Short Term Goal 2 (Week 2): Patient will don pull over shirt with min assist OT Short Term Goal 3 (Week 2): Patient will stand sufficiently for toileting hygiene or clothing management with no more than min assist OT Short Term Goal 4 (Week 2): Patient will visually scan to left of midline to locate needed ADLitems without cueing  Skilled Therapeutic Interventions/Progress Updates:    Patient received supine in bed - flat affect.  Patient's daughter at bedside.  Patient declined shower stating- "I got sick after the last time.  I was throwing up.  This room stays so cold."  Patient agreeable to bathe at edge of bed and don clothing.  Max assist to complete lower body dressing today.  Mod assist upper body.  Sit to stand with mod assist to pull up clothing.   Transported patient to gym to address static to dynamic stand balance.  Patient initially with minimal weight on left leg and mass biased strongly to right side.  Worked with [employer] bar in front and mirror to address midline orientation.  Once midline worked on weight shift toward left.  Worked on active movement in left hand to slide along bar toward right.   Demonstrated and had patient begin to practice wheelchair mobility.  Patient able to use right hand for forward and backward.  Limited awareness/ problem solving to aide with wheelchair movement during this session.  Daughter attempting to brighten patient's mood this session.    Therapy Documentation Precautions:  Precautions Precautions: Fall Precaution Comments: Left hemiparesis Required Braces or Orthoses:  Other Brace Other Brace: L Resting hand splint and L PRAFO Restrictions Weight Bearing Restrictions: No Pain: Pain Assessment Pain Scale: 0-10 Pain Score: 0-No pain    Therapy/Group: Individual Therapy  Mariah Milling 10/14/2022, 11:27 AM

## 2022-10-14 NOTE — Progress Notes (Signed)
Physical Therapy Session Note  Patient Details  Name: [name] MRN: [id number] Date of Birth: [birth year]  Today's Date: 10/14/2022 PT Individual Time: 1415-1530 PT Individual Time Calculation (min): 75 min   Short Term Goals: Week 2:  PT Short Term Goal 1 (Week 2): Pt will complete bed mobility with modA PT Short Term Goal 2 (Week 2): Pt will complete bed<>chair transfers with modA and LRAD PT Short Term Goal 3 (Week 2): Pt will ambulate 22ft with maxA and LRAD  Skilled Therapeutic Interventions/Progress Updates:       Pt requesting assistance to toilet prior to leaving her room. Stand step transfer with mod/maxA to [facility] towards her stronger R side. +2 assist for managing brief in standing and patient continent of bladder on BSC - totalA for wiping and pericare - donned new brief and pants with dependent assist. Stand pivot transfer to w/c from [facility] with similar assist.  Patient's father arriving and present for remainder of session for observation.   Transported to main rehab hallway. Continued to focus on NMR gait training using R hand rail and hemi gait training. Ace wrapped L foot for DF assist. Sit<>Stands to R hand rail with minA. Gait training with modA with +2 assist for safety using w/c follow - totalA for managing LLE during gait cycle and blocking to prevent buckling. Used [employer], engineering for visual feedback throughout. Ambulated variable distances ~43ft to 78ft depending on fatigue.   Practiced car transfer with car height simulating their [place] - patient needing maxA to safely enter the vehicle and modA to exit towards her stronger R side. Patient requiring assist for managing both LE as well as trunk support as she falls backwards into the car while lifting BLE.   Returned to her room and used [place] to safely transfer her back to bed. minA for standing in [place] for bed mobility to return to supine. Repositioned in bed and pillows provided for L hemibody. Provided the father  with home measurement sheet and also discussed need for ramp. Alarm on, all needs met.      Therapy Documentation Precautions:  Precautions Precautions: Fall Precaution Comments: Left hemiparesis Required Braces or Orthoses: Other Brace Other Brace: L Resting hand splint and L PRAFO Restrictions Weight Bearing Restrictions: No General:     Therapy/Group: Individual Therapy  Jalie Eiland P Aadarsh Cozort PT 10/14/2022, 3:25 PM

## 2022-10-14 NOTE — Progress Notes (Signed)
Physical Therapy Session Note  Patient Details  Name: [name] MRN: [id number] Date of Birth: Dec 10, 1975  Today's Date: 10/14/2022 PT Individual Time: 1300-1330 PT Individual Time Calculation (min): 30 min    Short Term Goals: Week 2:  PT Short Term Goal 1 (Week 2): Pt will complete bed mobility with modA PT Short Term Goal 2 (Week 2): Pt will complete bed<>chair transfers with modA and LRAD PT Short Term Goal 3 (Week 2): Pt will ambulate 46ft with maxA and LRAD  Skilled Therapeutic Interventions/Progress Updates:       Pt in w/c to start - agreeable to therapy and denies pain. Transported patient to main rehab hallway to focus session on NMR gait training using R hand rail. MD present for part of session for rounding.  Ace wrapped L foot for DF assist. Patient getting tight in hamstrings in heel cord - would benefit from stretching.  Sit<>Stands to R hand rail with minA and blocking LLE - pt favors her R side quite heavily during sit<>Stand with minimal weight shifting Left.   Gait training 14ft + 10ft (seated rest) with modA using R hand rail. PT on stool to facilitate hemi gait. Patient demonstrating return in LLE with quad activation in stance and trace hip flexion in swing - continues to require totalA for managing LLE during gait cycle. She also is demonstrating improved trunk control and motor planning for stepping while working on reciprocal stepping - does require facilitation for lateral weight shifting due to moderate L trunk lean.   Patient with an episode of urinary incontinence while ambulating - aware but unable to hold. Patient blames the "fluid pills" but discussed urinary incontinence after CVA.   Pt returned to her room and patient requesting female staff to assist with pericare and brief change. Pt ended session seated in w/c with all needs met - call bell used to call for staff assist.      Therapy Documentation Precautions:  Precautions Precautions:  Fall Precaution Comments: Left hemiparesis Required Braces or Orthoses: Other Brace Other Brace: L Resting hand splint and L PRAFO Restrictions Weight Bearing Restrictions: No General:     Therapy/Group: Individual Therapy  Bawi Lakins P Jahkeem Kurka 10/14/2022, 7:25 AM

## 2022-10-15 LAB — GLUCOSE, CAPILLARY
Glucose-Capillary: 144 mg/dL — ABNORMAL HIGH (ref 70–99)
Glucose-Capillary: 174 mg/dL — ABNORMAL HIGH (ref 70–99)
Glucose-Capillary: 143 mg/dL — ABNORMAL HIGH (ref 70–99)
Glucose-Capillary: 151 mg/dL — ABNORMAL HIGH (ref 70–99)

## 2022-10-15 MED ORDER — DOCUSATE SODIUM 100 MG PO CAPS
100.0000 mg | ORAL_CAPSULE | Freq: Two times a day (BID) | ORAL | Status: DC
  Administered 2022-10-15 – 2022-10-18 (×6): 100 mg via ORAL
  Filled 2022-10-15 (×6): qty 1

## 2022-10-15 NOTE — Progress Notes (Signed)
Patient up bed C/O back and left leg pain and back discomfort. Requested PRN Morphine 15 mg, rates pain 8/10. Upon recheck patient rates pain 3/10. Appetite fair encouraged water.

## 2022-10-15 NOTE — Progress Notes (Signed)
PROGRESS NOTE   Subjective/Complaints:  Pt doing alright, slept alright, pain well managed, LBM was yesterday but she feels the urge to defecate and sometimes nothing comes out. Would like to increase her colace. Urinating fine, has noticed some increase in volume since torsemide was increased. No other issues or concerns.   ROS: + back and neck pain-improved, +decreased initiation as per therapy- improved. +throbbing in b/l lower extremities-ongoing/improving +Constipation-improving. Denies CP, SOB, abd pain, n/v/d.    Objective:   No results found. No results for input(s): "WBC", "HGB", "HCT", "PLT" in the last 72 hours.   Recent Labs    10/13/22 1107  NA 137  K 4.3  CL 99  CO2 29  GLUCOSE 112*  BUN 12  CREATININE 1.36*  CALCIUM 9.1      Intake/Output Summary (Last 24 hours) at 10/15/2022 1110 Last data filed at 10/15/2022 1039 Gross per 24 hour  Intake 357 ml  Output 400 ml  Net -43 ml        Physical Exam: Vital Signs Blood pressure (!) 125/95, pulse 69, temperature 97.9 F (36.6 C), resp. rate 18, height 4\' 11"  (1.499 m), weight 79.9 kg, last menstrual period 06/22/2022, SpO2 98 %.  Constitutional: No distress . Vital signs reviewed. Obese. BMI 33.53 HEENT: NCAT, EOMI, oral membranes moist.  Neck: supple Cardiovascular: RRR without murmur. No JVD    Respiratory/Chest: CTA Bilaterally without wheezes or rales. Normal effort    GI/Abdomen: BS +, non-tender, non-distended Ext: no clubbing or cyanosis, 1+ BLE edema up to mid-calf, improved from last week Psych: very flat, depressed, very polite and respectful and appreciative Skin: dry, warm MSK: weak in L arm, able to move R arm around in bed, full MsK exam not performed.  Neurologic: Pt less tired than before. Speech dysarthric, low volume. Language appears to be intact.    PRIOR EXAM: Neurologic: Right gaze preference but can follow to left to a  lesser extent. No obvious field cut. Left central 7 and tongue deviation. LUE and LLE 0/5. Senses pain and LT in all 4's. No resting tone. DTR's 1+ throughout. Toes down left foot, no clonus.    Assessment/Plan: 1. Functional deficits which require 3+ hours per day of interdisciplinary therapy in a comprehensive inpatient rehab setting. Physiatrist is providing close team supervision and 24 hour management of active medical problems listed below. Physiatrist and rehab team continue to assess barriers to discharge/monitor patient progress toward functional and medical goals  Care Tool:  Bathing    Body parts bathed by patient: Left arm, Chest, Abdomen, Front perineal area, Right upper leg, Left upper leg, Face   Body parts bathed by helper: Right arm, Buttocks, Right lower leg, Left lower leg     Bathing assist Assist Level: Moderate Assistance - Patient 50 - 74%     Upper Body Dressing/Undressing Upper body dressing   What is the patient wearing?: Pull over shirt    Upper body assist Assist Level: Moderate Assistance - Patient 50 - 74%    Lower Body Dressing/Undressing Lower body dressing      What is the patient wearing?: Incontinence brief, Pants     Lower body assist  Assist for lower body dressing: Maximal Assistance - Patient 25 - 49%     Toileting Toileting Toileting Activity did not occur (Clothing management and hygiene only): N/A (no void or bm)  Toileting assist Assist for toileting: 2 Helpers     Transfers Chair/bed transfer  Transfers assist  Chair/bed transfer activity did not occur: Safety/medical concerns  Chair/bed transfer assist level: Moderate Assistance - Patient 50 - 74%     Locomotion Ambulation   Ambulation assist   Ambulation activity did not occur: Safety/medical concerns          Walk 10 feet activity   Assist  Walk 10 feet activity did not occur: Safety/medical concerns        Walk 50 feet activity   Assist Walk 50  feet with 2 turns activity did not occur: Safety/medical concerns         Walk 150 feet activity   Assist Walk 150 feet activity did not occur: Safety/medical concerns         Walk 10 feet on uneven surface  activity   Assist Walk 10 feet on uneven surfaces activity did not occur: Safety/medical concerns         Wheelchair     Assist Is the patient using a wheelchair?: Yes Type of Wheelchair: Manual Wheelchair activity did not occur: Safety/medical concerns         Wheelchair 50 feet with 2 turns activity    Assist    Wheelchair 50 feet with 2 turns activity did not occur: Safety/medical concerns       Wheelchair 150 feet activity     Assist  Wheelchair 150 feet activity did not occur: Safety/medical concerns       Blood pressure (!) 125/95, pulse 69, temperature 97.9 F (36.6 C), resp. rate 18, height 4\' 11"  (1.499 m), weight 79.9 kg, last menstrual period 06/22/2022, SpO2 98 %.    Medical Problem List and Plan: 1. 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  Antithrombotics: -DVT/anticoagulation:  Pharmaceutical: Eliquis 5mg  BID             -antiplatelet therapy: N/A 3.Back pain/Pain Management:  Tylenol prn. Oxycodone making her nausea worse and ineffective--changed to MSIR 15mg  q6h PRN prior to d/c from acute, reordered here  4. Mood/Behavior/Sleep: LCSW to follow for evaluation and support.              -antipsychotic agents: N/A -pt reports chronic anxiety at baseline. Expresses that she's depressed as well currently.              -took atarax at home 10mg  tid prn. Will resume.             -begin lexapro 5mg  qhs             -neuropsych eval -10/02/22 started Trazodone 25mg  QHS and Hydroxyzine 25mg  QHS for now, monitor for effect or  oversedation; left Hydroxyzine 10mg  TID PRN dosing for anxiety in place  -10/08/22 not sleeping well; added melatonin 5mg , monitor for improvement  -10/15/22 sleep stable 5. Neuropsych/cognition: This patient is not capable of making decisions on her own  behalf. 6. Skin/Wound Care:  Routine pressure relief  measures.  7. Fluids/Electrolytes/Nutrition: Monitor I/O. Monitor routine labs weekly, next 10/17/22 8. Acute on chronic combined CHF: HH diet.  -Monitor weights daily and for signs of overload. Asked nursing to weight her today -continue Torsemide 40mg  QD (>>10mg  QD), Losartan 50mg  QD, Spironolactone 25mg  QD, Jardiance 10mg  QD (>>25mg  QD), and Metoprolol 25mg  BID  -Weight reviewed 3/15 and is significantly downtrending, asked RN to reweigh and discussed with RN what patient has been eating, decrease torsemide to 10mg . Monitor weights daily.  -Increase torsemide back to 20mg  daily. Will not increase further despite slight weight increase given slight rise in creatinine -10/15/22 wt uptrending but edema improving, BP stable, monitor Filed Weights   10/12/22 0505 10/14/22 0919 10/15/22 0700  Weight: 74.2 kg 75.3 kg 79.9 kg   Vitals:   10/11/22 0541 10/11/22 1346 10/11/22 2055 10/12/22 0504  BP: 120/86 107/81 119/86 (!) 136/95   10/12/22 1931 10/13/22 0511 10/13/22 1445 10/13/22 1940  BP: 118/83 131/89 117/83 (!) 129/95   10/14/22 0608 10/14/22 1541 10/14/22 2021 10/15/22 0502  BP: (!) 123/94 (!) 113/90 (!) 137/94 (!) 125/95    9. T2DM: Hgb A1C-10.8  (improving >15 @ 11/23). Was on Lantus 15u w/ novolog 12 units with supper>>not restarted             --monitor BS ac/hs and use SSI for elevated BS             --on SSI, Jardiance 10mg  QD -10/02/22 CBGs well controlled with 2-3U of novolog use per meal so far, monitor for trends  -3/13 increase Jardiance to 25mg   -d/c SSI 10/12/22  -10/15/22 CBGs well controlled, cont monitoring CBG (last 3)  Recent Labs    10/14/22 1645 10/14/22 2024  10/15/22 0629  GLUCAP 133* 154* 144*    10. Severe obesity: Educate patient on diet, compliance and exercise to help promote health and mobility. Magnesium reviewed and normal.  11. Acute on Chronic renal failure/AKI: Diuretic held. Cr improved 3/13 but worsened 3/18. Decrease torsemide to 10mg . Monitor weekly. Discussed with patient. Weight continues to be stable.   -10/13/22 Cr 1.36, relatively stable, monitor on weekly labs, next 10/17/22 12. Hyperlipidemia: continue lipitor 80mg  QHS  13. Decreased initiation: increase amantadine to 200mg  daily, continue, appears to be helping, discussed improvements with therapy 14. Vitamin D insufficiency: 21.41 on 10/04/22, continue ergocalciferol 50,000U once per week for 7 weeks. Daily supplement d/ced 15. Bilateral lower extremity throbbing: -10/09/22 SCDs need adjusting, asked nursing to assist; added Robaxin 500mg  q6h PRN for leg spasms -[place] reviewed and negative for clots, asking nursing to check whether SCDs are working, continue SCDs 16. Constipation: -continue Miralax 17g to BID, add colace 100mg  QD; monitor for effect.  -messaged nursing regarding when last BM was -10/15/22 LBM yesterday, but pt would like to increase colace to BID    LOS: 14 days A FACE TO [place] 10/15/2022, 11:10 AM

## 2022-10-15 NOTE — Progress Notes (Signed)
Occupational Therapy Session Note  Patient Details  Name: [name] MRN: [id number] Date of Birth: 1976/02/07  Today's Date: 10/15/2022 OT Individual Time: [id number] OT Individual Time Calculation (min): 60 min    Short Term Goals: Week 1:  OT Short Term Goal 1 (Week 1): Pt will complete Sit to stand transfer with Mod A + LRAD, in preparation for ADL participation. OT Short Term Goal 1 - Progress (Week 1): Met OT Short Term Goal 2 (Week 1): Pt will complete squat-pivot transfer from EOB<>BSC with Mod A + LRAD. OT Short Term Goal 2 - Progress (Week 1): Met OT Short Term Goal 3 (Week 1): Pt will complete LB ADLs with Max A + LRAD. OT Short Term Goal 3 - Progress (Week 1): Met  Skilled Therapeutic Interventions/Progress Updates:    The pt was supine in bed upon arrival, the pt inidcated that she was in agreement with bathing and dressing at sink LOF.  The pt was able to transfer from supine to EOB with CGA using the bed rail, and from EOB to w/c with ModA using a stand pivot transfer. The pt was able to remove her over head top with MinA after demonstration and initial cues. The pt was able to grab the sink with her right hand and come into standing, with me on the left for washing her perineal and bottom with s/u assist and initial cues.  The pt was MaxA for bathing BLE below the knee region.    The pt was transported to the restroom  and instructed to use her R foot to hook the LLE and extend it during transport.  The pt was able to complete a stand pivot transfer with her right hand on the armrest of the  w/c and me on the left side for additional balance and safe placement onto the 3 in 1 raised commode . The pt was encouraged to incorporate her right hand for stabilizing and adjusting her balance while positioned  using the armrest, she was able to complete the task at [facility] secondary a heavy left lean.  The pt was able to wipe herself  while shifting her weight, seated on the commode.  The  pt was MaxA for donning her brief, pants, socks, and shoes.  The pt presents with a high degree of fear for falls and was encouraged that we would take every measure to make certain to keep her safe. The pt was instructed in hemi technique to improve her independence during BADL performance to reduce the burden of care for others.  The pt asked if she could return to bed LOF at the end of the session and was able to transfer using the bed rail for a  stand pivot transfer with me on the left for additional balance at [facility] .All additional needs of the pt were addressed prior to exiting the room.   Therapy Documentation Precautions:  Precautions Precautions: Fall Precaution Comments: Left hemiparesis Required Braces or Orthoses: Other Brace Other Brace: L Resting hand splint and L PRAFO Restrictions Weight Bearing Restrictions: No  Therapy/Group: Individual Therapy  Yvonne Kendall 10/15/2022, 12:40 PM

## 2022-10-16 LAB — GLUCOSE, CAPILLARY
Glucose-Capillary: 142 mg/dL — ABNORMAL HIGH (ref 70–99)
Glucose-Capillary: 165 mg/dL — ABNORMAL HIGH (ref 70–99)
Glucose-Capillary: 132 mg/dL — ABNORMAL HIGH (ref 70–99)
Glucose-Capillary: 192 mg/dL — ABNORMAL HIGH (ref 70–99)

## 2022-10-16 NOTE — Progress Notes (Signed)
PROGRESS NOTE   Subjective/Complaints:  Pt doing well today. Slept well. LBM 2 days ago, but doesn't feel constipated and doesn't want to make any med adjustments. Urinating fine. Pain is well managed. Denies any other complaints or concerns.   ROS: + back and neck pain-improved, +decreased initiation as per therapy- improved. +throbbing in b/l lower extremities-ongoing/improving +Constipation-improving. Denies CP, SOB, abd pain, n/v/d.    Objective:   No results found. No results for input(s): "WBC", "HGB", "HCT", "PLT" in the last 72 hours.   No results for input(s): "NA", "K", "CL", "CO2", "GLUCOSE", "BUN", "CREATININE", "CALCIUM" in the last 72 hours.    Intake/Output Summary (Last 24 hours) at 10/16/2022 1108 Last data filed at 10/16/2022 0748 Gross per 24 hour  Intake 480 ml  Output --  Net 480 ml        Physical Exam: Vital Signs Blood pressure (!) 128/97, pulse 70, temperature 97.9 F (36.6 C), resp. rate 18, height 4\' 11"  (1.499 m), weight 73.7 kg, last menstrual period 06/22/2022, SpO2 100 %.  Constitutional: No distress . Vital signs reviewed. Obese. BMI 33.53 HEENT: NCAT, EOMI, oral membranes moist.  Neck: supple Cardiovascular: RRR without murmur. No JVD    Respiratory/Chest: CTA Bilaterally without wheezes or rales. Normal effort    GI/Abdomen: BS +, non-tender, non-distended Ext: no clubbing or cyanosis, 1+ BLE edema up to mid-calf, improving Psych: very flat, less depressed, but polite and respectful and appreciative Skin: dry, warm MSK: weak in L arm, able to move R arm around in bed, full MsK exam not performed.  Neurologic: Pt less tired than before. Speech mildly dysarthric, low volume. Language appears to be intact.    PRIOR EXAM: Neurologic: Right gaze preference but can follow to left to a lesser extent. No obvious field cut. Left central 7 and tongue deviation. LUE and LLE 0/5. Senses pain  and LT in all 4's. No resting tone. DTR's 1+ throughout. Toes down left foot, no clonus.    Assessment/Plan: 1. Functional deficits which require 3+ hours per day of interdisciplinary therapy in a comprehensive inpatient rehab setting. Physiatrist is providing close team supervision and 24 hour management of active medical problems listed below. Physiatrist and rehab team continue to assess barriers to discharge/monitor patient progress toward functional and medical goals  Care Tool:  Bathing    Body parts bathed by patient: Left arm, Chest, Abdomen, Front perineal area, Right upper leg, Left upper leg, Face   Body parts bathed by helper: Right arm, Buttocks, Right lower leg, Left lower leg     Bathing assist Assist Level: Moderate Assistance - Patient 50 - 74%     Upper Body Dressing/Undressing Upper body dressing   What is the patient wearing?: Pull over shirt    Upper body assist Assist Level: Moderate Assistance - Patient 50 - 74%    Lower Body Dressing/Undressing Lower body dressing      What is the patient wearing?: Incontinence brief, Pants     Lower body assist Assist for lower body dressing: Maximal Assistance - Patient 25 - 49%     Toileting Toileting Toileting Activity did not occur (Clothing management and hygiene only): N/A (no  void or bm)  Toileting assist Assist for toileting: 2 Helpers     Transfers Chair/bed transfer  Transfers assist  Chair/bed transfer activity did not occur: Safety/medical concerns  Chair/bed transfer assist level: Moderate Assistance - Patient 50 - 74%     Locomotion Ambulation   Ambulation assist   Ambulation activity did not occur: Safety/medical concerns          Walk 10 feet activity   Assist  Walk 10 feet activity did not occur: Safety/medical concerns        Walk 50 feet activity   Assist Walk 50 feet with 2 turns activity did not occur: Safety/medical concerns         Walk 150 feet  activity   Assist Walk 150 feet activity did not occur: Safety/medical concerns         Walk 10 feet on uneven surface  activity   Assist Walk 10 feet on uneven surfaces activity did not occur: Safety/medical concerns         Wheelchair     Assist Is the patient using a wheelchair?: Yes Type of Wheelchair: Manual Wheelchair activity did not occur: Safety/medical concerns         Wheelchair 50 feet with 2 turns activity    Assist    Wheelchair 50 feet with 2 turns activity did not occur: Safety/medical concerns       Wheelchair 150 feet activity     Assist  Wheelchair 150 feet activity did not occur: Safety/medical concerns       Blood pressure (!) 128/97, pulse 70, temperature 97.9 F (36.6 C), resp. rate 18, height 4\' 11"  (1.499 m), weight 73.7 kg, last menstrual period 06/22/2022, SpO2 100 %.    Medical Problem List and Plan: 1. 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  Antithrombotics: -DVT/anticoagulation:  Pharmaceutical: Eliquis 5mg  BID             -antiplatelet therapy: N/A 3.Back pain/Pain Management:  Tylenol prn. Oxycodone making her nausea worse and ineffective--changed to MSIR 15mg  q6h PRN prior to d/c from acute, reordered here  4. Mood/Behavior/Sleep: LCSW to follow for evaluation and support.              -antipsychotic agents: N/A -pt reports chronic anxiety at baseline. Expresses that she's depressed as well currently.              -took atarax at home 10mg  tid prn. Will resume.             -begin lexapro 5mg  qhs             -neuropsych eval -10/02/22 started Trazodone 25mg  QHS and Hydroxyzine 25mg  QHS for now, monitor for effect or oversedation; left Hydroxyzine 10mg  TID PRN dosing for anxiety in place  -10/08/22 not  sleeping well; added melatonin 5mg , monitor for improvement  -10/16/22 sleep stable 5. Neuropsych/cognition: This patient is not capable of making decisions on her own behalf. 6. Skin/Wound Care:  Routine pressure relief  measures.  7. Fluids/Electrolytes/Nutrition: Monitor I/O. Monitor routine labs weekly, next 10/17/22 8. Acute on chronic combined CHF: HH diet.  -  Monitor weights daily and for signs of overload. Asked nursing to weight her today -continue Torsemide 40mg  QD (>>10mg  QD), Losartan 50mg  QD, Spironolactone 25mg  QD, Jardiance 10mg  QD (>>25mg  QD), and Metoprolol 25mg  BID  -Weight reviewed 3/15 and is significantly downtrending, asked RN to reweigh and discussed with RN what patient has been eating, decrease torsemide to 10mg . Monitor weights daily.  -Increase torsemide back to 20mg  daily. Will not increase further despite slight weight increase given slight rise in creatinine -10/15/22 wt uptrending but edema improving, BP stable, monitor -10/16/22 wt back down, suspect wildly inaccurate readings; may help if we could get standing weights. Monitor trend. BP stable.  Filed Weights   10/14/22 0919 10/15/22 0700 10/16/22 0656  Weight: 75.3 kg 79.9 kg 73.7 kg   Vitals:   10/12/22 0504 10/12/22 1931 10/13/22 0511 10/13/22 1445  BP: (!) 136/95 118/83 131/89 117/83   10/13/22 1940 10/14/22 0608 10/14/22 1541 10/14/22 2021  BP: (!) 129/95 (!) 123/94 (!) 113/90 (!) 137/94   10/15/22 0502 10/15/22 1325 10/15/22 1831 10/16/22 0656  BP: (!) 125/95 118/81 (!) 132/91 (!) 128/97    9. T2DM: Hgb A1C-10.8  (improving >15 @ 11/23). Was on Lantus 15u w/ novolog 12 units with supper>>not restarted             --monitor BS ac/hs and use SSI for elevated BS             --on SSI, Jardiance 10mg  QD -10/02/22 CBGs well controlled with 2-3U of novolog use per meal so far, monitor for trends  -3/13 increase Jardiance to 25mg   -d/c SSI 10/12/22  -3/23-24/24 CBGs well controlled, cont monitoring CBG (last 3)   Recent Labs    10/15/22 1711 10/15/22 2150 10/16/22 0659  GLUCAP 143* 151* 142*    10. Severe obesity: Educate patient on diet, compliance and exercise to help promote health and mobility. Magnesium reviewed and normal.  11. Acute on Chronic renal failure/AKI: Diuretic held. Cr improved 3/13 but worsened 3/18. Decrease torsemide to 10mg . Monitor weekly. Discussed with patient. Weight continues to be stable.   -10/13/22 Cr 1.36, relatively stable, monitor on weekly labs, next 10/17/22 12. Hyperlipidemia: continue lipitor 80mg  QHS  13. Decreased initiation: increase amantadine to 200mg  daily, continue, appears to be helping, discussed improvements with therapy 14. Vitamin D insufficiency: 21.41 on 10/04/22, continue ergocalciferol 50,000U once per week for 7 weeks. Daily supplement d/ced 15. Bilateral lower extremity throbbing: -10/09/22 SCDs need adjusting, asked nursing to assist; added Robaxin 500mg  q6h PRN for leg spasms -[place] reviewed and negative for clots, asking nursing to check whether SCDs are working, continue SCDs 16. Constipation: -continue Miralax 17g to BID, add colace 100mg  QD; monitor for effect.  -messaged nursing regarding when last BM was -10/15/22 LBM yesterday, but pt would like to increase colace to BID -10/16/22 no BM since 10/14/22 but would prefer to not make adjustments to meds today; monitor overall    LOS: 15 days A FACE TO [place] 10/16/2022, 11:08 AM

## 2022-10-16 NOTE — Progress Notes (Addendum)
Speech Language Pathology Daily Session Note  Patient Details  Name: [name] MRN: [id number] Date of Birth: 08-03-1975  Today's Date: 10/16/2022 SLP Individual Time: [id number] SLP Individual Time Calculation (min): 33 min and Today's Date: 10/16/2022 SLP Missed Time: 27 Minutes Missed Time Reason: Nursing care  Short Term Goals: Week 3: SLP Short Term Goal 1 (Week 2): Patient will complete medication management tasks with min-to-mod A verbal cues to achieve 70% accuracy SLP Short Term Goal 2 (Week 2): Patient will complete problem solving tasks with awareness of errors given min-to-mod A verbal cues to ID and repair SLP Short Term Goal 3 (Week 2): Pt will trial regular textures with functional pharyngeal and no overt s/sx concering for airway intrusion with Mod I for implementation of safe swallowing strategies. SLP Short Term Goal 4 (Week 2): Pt will recall novel, functional information 15 minute delay with 80% acc with mod verbal and visual cues  Skilled Therapeutic Interventions:   Pt seen for skilled SLP session to address dysphagia and cognitive goals. Pt agreeable to regular PO trials. Pt required additional time for mastication and oral clearance of regular solid trial. She demonstrated good awareness of oral residue and independently utilized lingual sweep and multiple liquid washes to reduce residue. A trace-min amount remained in the L buccal sulci, which was cleared with one more liquid wash. Pt agreeable to regular consistency trial tray for next session to determine diet advancement as indicated. Cognitive goals addressed verbal and visual safety awareness. Pt required min-mod cues to identify all safety hazards in pictured scene. She answered situational safety questions with 100% accuracy. Session shortened due to pt's need for bathroom. She requested to discontinue session completely vs continue session after bathroom use.   Pain Pain Assessment Pain Scale: 0-10 Pain  Score: 0-No pain Pain Type: Acute pain Pain Location: Leg Pain Orientation: Left Pain Descriptors / Indicators: Aching Pain Intervention(s): Medication (See eMAR)  Therapy/Group: Individual Therapy  Wyn Forster 10/16/2022, 9:45 AM

## 2022-10-17 LAB — GLUCOSE, CAPILLARY
Glucose-Capillary: 113 mg/dL — ABNORMAL HIGH (ref 70–99)
Glucose-Capillary: 143 mg/dL — ABNORMAL HIGH (ref 70–99)
Glucose-Capillary: 96 mg/dL (ref 70–99)
Glucose-Capillary: 150 mg/dL — ABNORMAL HIGH (ref 70–99)

## 2022-10-17 LAB — BASIC METABOLIC PANEL
Anion gap: 10 (ref 5–15)
BUN: 10 mg/dL (ref 6–20)
CO2: 29 mmol/L (ref 22–32)
Calcium: 9.3 mg/dL (ref 8.9–10.3)
Chloride: 98 mmol/L (ref 98–111)
Creatinine, Ser: 1.39 mg/dL — ABNORMAL HIGH (ref 0.44–1.00)
GFR, Estimated: 48 mL/min — ABNORMAL LOW (ref >60)
Glucose, Bld: 128 mg/dL — ABNORMAL HIGH (ref 70–99)
Potassium: 3.7 mmol/L (ref 3.5–5.1)
Sodium: 137 mmol/L (ref 135–145)

## 2022-10-17 LAB — CBC
HCT: 38 % (ref 36.0–46.0)
Hemoglobin: 12.1 g/dL (ref 12.0–15.0)
MCH: 22.6 pg — ABNORMAL LOW (ref 26.0–34.0)
MCHC: 31.8 g/dL (ref 30.0–36.0)
MCV: 71 fL — ABNORMAL LOW (ref 80.0–100.0)
Platelets: 276 10*3/uL (ref 150–400)
RBC: 5.35 MIL/uL — ABNORMAL HIGH (ref 3.87–5.11)
RDW: 22.2 % — ABNORMAL HIGH (ref 11.5–15.5)
WBC: 6.4 10*3/uL (ref 4.0–10.5)
nRBC: 0 % (ref 0.0–0.2)

## 2022-10-17 MED ORDER — TORSEMIDE 20 MG PO TABS
10.0000 mg | ORAL_TABLET | Freq: Every day | ORAL | Status: DC
  Administered 2022-10-18 – 2022-10-19 (×2): 10 mg via ORAL
  Filled 2022-10-17 (×2): qty 1

## 2022-10-17 NOTE — Plan of Care (Signed)
  Problem: RH BOWEL ELIMINATION Goal: RH STG MANAGE BOWEL WITH ASSISTANCE Description: STG Manage Bowel with min Assistance. Outcome: Not Progressing; LBM 3/22;   Problem: RH BLADDER ELIMINATION Goal: RH STG MANAGE BLADDER WITH ASSISTANCE Description: STG Manage Bladder With min toileting Assistance Outcome: Not Progressing; incontinence

## 2022-10-17 NOTE — Progress Notes (Signed)
Physical Therapy Session Note  Patient Details  Name: [name] MRN: [id number] Date of Birth: 1976/01/16  Today's Date: 10/17/2022 PT Individual Time: 1000-1045 PT Individual Time Calculation (min): 45 min   Short Term Goals: Week 2:  PT Short Term Goal 1 (Week 2): Pt will complete bed mobility with modA PT Short Term Goal 2 (Week 2): Pt will complete bed<>chair transfers with modA and LRAD PT Short Term Goal 3 (Week 2): Pt will ambulate 48ft with maxA and LRAD  Skilled Therapeutic Interventions/Progress Updates:      Therapy Documentation Precautions:  Precautions Precautions: Fall Precaution Comments: Left hemiparesis Required Braces or Orthoses: Other Brace Other Brace: L Resting hand splint and L PRAFO Restrictions Weight Bearing Restrictions: No    Pt received seated in hemi-height manual w/c, agreeable to PT session with emphasis on transfers. Pt with unrated "throbbing" L LE pain, pre-medicated and provided rest breaks for relief.   Pt transported total A for time management to ortho gym. Pt initially requires max A with sit to stand with PT positioned anterior to pt for L LE knee block and fades to min A with repetitions x 3.   Pt requires mod A for dynamic standing balance as pt utilized R UE to perform contralateral reach for 6 cones x 3 to encourage increased L LE weight bearing and activation.   Pt requested to return to room to void and transported total A for time management. Pt min A x 2 with sit to stand with stedy and continent of bladder. Pt requires total A for pericare and transferred by [facility] to manual w/c.   Pt left in care of nurse with all needs in reach seated in w/c at bedside.    Therapy/Group: Individual Therapy  Verl Dicker Verl Dicker PT, DPT  10/17/2022, 7:40 AM

## 2022-10-17 NOTE — Progress Notes (Signed)
Speech Language Pathology Daily Session Note  Patient Details  Name: [name] MRN: [id number] Date of Birth: Nov 17, 1975  Today's Date: 10/17/2022 SLP Individual Time: 1211-1306 SLP Individual Time Calculation (min): 55 min  Short Term Goals: Week 3: SLP Short Term Goal 1 (Week 2): Patient will complete medication management tasks with min-to-mod A verbal cues to achieve 70% accuracy SLP Short Term Goal 2 (Week 2): Patient will complete problem solving tasks with awareness of errors given min-to-mod A verbal cues to ID and repair SLP Short Term Goal 3 (Week 2): Pt will trial regular textures with functional pharyngeal and no overt s/sx concering for airway intrusion with Mod I for implementation of safe swallowing strategies. SLP Short Term Goal 4 (Week 2): Pt will recall novel, functional information 15 minute delay with 80% acc with mod verbal and visual cues  Skilled Therapeutic Interventions:   Pt seen for skilled SLP session to address swallowing and functional cognitive-linguistic goals. Regarding swallowing, pt tried regular consistency meal tray consisting  of baked chicken and mac and cheese. Pt needed assistance to cut up chicken breast into bite sized pieces. Oral prep and transit observed to be mildly prolonged with min-mod oral residue noted. Pt cleared residue with multiple sips of thin liquids. No overt or subtle s/s of aspiration observed. Pt reported chicken was too dry and expressed preference to remain on Dys 3 diet at this time. Pt participated in start of medication management task with min cues. Completed medication chart per meds listed in EMR with moderate assistance from SLP. Will plan to complete pill box activity next session as able. Pt left sitting up in w/c with family member present and call bell in reach. Continue SLP PoC.   Pain  No pain reported  Therapy/Group: Individual Therapy  Wyn Forster 10/17/2022, 1:12 PM

## 2022-10-17 NOTE — Progress Notes (Signed)
Physical Therapy Session Note  Patient Details  Name: [name] MRN: [id number] Date of Birth: October 11, 1975  Today's Date: 10/17/2022 PT Individual Time: 1100-1155 PT Individual Time Calculation (min): 55 min   Short Term Goals: Week 2:  PT Short Term Goal 1 (Week 2): Pt will complete bed mobility with modA PT Short Term Goal 2 (Week 2): Pt will complete bed<>chair transfers with modA and LRAD PT Short Term Goal 3 (Week 2): Pt will ambulate 51ft with maxA and LRAD  Skilled Therapeutic Interventions/Progress Updates:      Pt sitting in w/c to start - no reports of pain. Flat affect and benefits from jovial conversation and encouragement throughout session.  Pt transported to main rehab hallway to focus entirety of session on NMR gait training using LiteGait overground with partial BWS. Ace wrapped L foot for DF assist and also ace wrapped her L hand to LiteGait to promote weight bearing and postural support. Used +2 assist with rehab tech steering/guiding LiteGait with PT on stool to facilitate hemi gait training. Pt requires minA for rising to stand from w/c to LiteGait and harness donned/doffed in standing with +2 assist on standby and minA for balance.   Pt ambulated 55ft + 16ft + 15ft (seated rest breaks) with the above described setup. Pt requiring mod/maxA overall with assist primarily for facilitating weight shifting while advancing LLE and for total aspects of LLE during gait cycle (advancing, placement, and blocking to promote TKE). Pt is starting to activate quads (!!) in stance phase but fatigues out after ~15-46ft. Minimal hip/knee flexion to promote swing. Needs assist to keep L foot in neutral position as it tends to externally rotate. PT facilitating reciprocal stepping and to avoid step-to pattern throughout NMR gait training.   Pt returned to her room - ended session seated in w/c with 1/2 lap tray supporting LUE, call bell in lap.     Therapy Documentation Precautions:   Precautions Precautions: Fall Precaution Comments: Left hemiparesis Required Braces or Orthoses: Other Brace Other Brace: L Resting hand splint and L PRAFO Restrictions Weight Bearing Restrictions: No General:     Therapy/Group: Individual Therapy  [name] 10/17/2022, 7:37 AM

## 2022-10-17 NOTE — Progress Notes (Signed)
PROGRESS NOTE   Subjective/Complaints: No new complaints this morning Denies pain currently but still occasionally has cramps in legs, asked nursing Angelina to apply SCDs today  ROS: + back and neck pain-improved, +decreased initiation as per therapy- improved. +throbbing in b/l lower extremities-ongoing +Constipation-improving. Denies CP, SOB, abd pain, n/v/d.    Objective:   No results found. Recent Labs    10/17/22 0646  WBC 6.4  HGB 12.1  HCT 38.0  PLT 276     Recent Labs    10/17/22 0646  NA 137  K 3.7  CL 98  CO2 29  GLUCOSE 128*  BUN 10  CREATININE 1.39*  CALCIUM 9.3      Intake/Output Summary (Last 24 hours) at 10/17/2022 1046 Last data filed at 10/16/2022 1640 Gross per 24 hour  Intake 120 ml  Output 275 ml  Net -155 ml        Physical Exam: Vital Signs Blood pressure (!) 124/97, pulse 64, temperature 97.6 F (36.4 C), temperature source Oral, resp. rate 16, height 4\' 11"  (1.499 m), weight 68.5 kg, last menstrual period 06/22/2022, SpO2 100 %.  Constitutional: No distress . Vital signs reviewed. Obese. BMI 30.50 HEENT: NCAT, EOMI, oral membranes moist.  Neck: supple Cardiovascular: RRR without murmur. No JVD    Respiratory/Chest: CTA Bilaterally without wheezes or rales. Normal effort    GI/Abdomen: BS +, non-tender, non-distended Ext: no clubbing or cyanosis, no edema Psych: very flat, depressed, very polite and respectful and appreciative Skin: dry, warm MSK: weak in L arm, able to move R arm around in bed, full MsK exam not performed.  Neurologic: Pt less tired than before. Speech dysarthric, low volume. Language appears to be intact.    PRIOR EXAM: Neurologic: Right gaze preference but can follow to left to a lesser extent. No obvious field cut. Left central 7 and tongue deviation. LUE and LLE 0/5. Senses pain and LT in all 4's. No resting tone. DTR's 1+ throughout. Toes down left  foot, no clonus.    Assessment/Plan: 1. Functional deficits which require 3+ hours per day of interdisciplinary therapy in a comprehensive inpatient rehab setting. Physiatrist is providing close team supervision and 24 hour management of active medical problems listed below. Physiatrist and rehab team continue to assess barriers to discharge/monitor patient progress toward functional and medical goals  Care Tool:  Bathing    Body parts bathed by patient: Left arm, Chest, Abdomen, Front perineal area, Right upper leg, Left upper leg, Face   Body parts bathed by helper: Right arm, Buttocks, Right lower leg, Left lower leg     Bathing assist Assist Level: Moderate Assistance - Patient 50 - 74%     Upper Body Dressing/Undressing Upper body dressing   What is the patient wearing?: Pull over shirt    Upper body assist Assist Level: Moderate Assistance - Patient 50 - 74%    Lower Body Dressing/Undressing Lower body dressing      What is the patient wearing?: Incontinence brief, Pants     Lower body assist Assist for lower body dressing: Maximal Assistance - Patient 25 - 49%     Toileting Toileting Toileting Activity did not occur (  Clothing management and hygiene only): N/A (no void or bm)  Toileting assist Assist for toileting: 2 Helpers     Transfers Chair/bed transfer  Transfers assist  Chair/bed transfer activity did not occur: Safety/medical concerns  Chair/bed transfer assist level: Moderate Assistance - Patient 50 - 74%     Locomotion Ambulation   Ambulation assist   Ambulation activity did not occur: Safety/medical concerns          Walk 10 feet activity   Assist  Walk 10 feet activity did not occur: Safety/medical concerns        Walk 50 feet activity   Assist Walk 50 feet with 2 turns activity did not occur: Safety/medical concerns         Walk 150 feet activity   Assist Walk 150 feet activity did not occur: Safety/medical  concerns         Walk 10 feet on uneven surface  activity   Assist Walk 10 feet on uneven surfaces activity did not occur: Safety/medical concerns         Wheelchair     Assist Is the patient using a wheelchair?: Yes Type of Wheelchair: Manual Wheelchair activity did not occur: Safety/medical concerns         Wheelchair 50 feet with 2 turns activity    Assist    Wheelchair 50 feet with 2 turns activity did not occur: Safety/medical concerns       Wheelchair 150 feet activity     Assist  Wheelchair 150 feet activity did not occur: Safety/medical concerns       Blood pressure (!) 124/97, pulse 64, temperature 97.6 F (36.4 C), temperature source Oral, resp. rate 16, height 4\' 11"  (1.499 m), weight 68.5 kg, last menstrual period 06/22/2022, SpO2 100 %.    Medical Problem List and Plan: 1. 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  Antithrombotics: -DVT/anticoagulation:  Pharmaceutical: Eliquis 5mg  BID             -antiplatelet therapy: N/A 3.Back pain/Pain Management:  Tylenol prn. Oxycodone making her nausea worse and ineffective--changed to MSIR 15mg  q6h PRN prior to d/c from acute, reordered here  4. Mood/Behavior/Sleep: LCSW to follow for evaluation and support.              -antipsychotic agents: N/A -pt reports chronic anxiety at baseline. Expresses that she's depressed as well currently.              -took atarax at home 10mg  tid prn. Will resume.             -begin lexapro 5mg  qhs             -neuropsych eval -10/02/22 started Trazodone 25mg  QHS and Hydroxyzine 25mg  QHS for now, monitor for effect or oversedation; left Hydroxyzine 10mg  TID PRN dosing for anxiety in place  -10/08/22 not sleeping well; added melatonin 5mg , monitor for  improvement  5. Neuropsych/cognition: This patient is not capable of making decisions on her own behalf. 6. Skin/Wound Care:  Routine pressure relief  measures.  7. Fluids/Electrolytes/Nutrition: Monitor I/O. Monitor routine labs weekly, next 10/10/22 8. Acute  on chronic combined CHF: HH diet.  -Monitor weights daily and for signs of overload. Asked nursing to weight her today -continue Torsemide 40mg  QD (>>10mg  QD), Losartan 50mg  QD, Spironolactone 25mg  QD, Jardiance 10mg  QD (>>25mg  QD), and Metoprolol 25mg  BID  -Weight reviewed 3/15 and is significantly downtrending, asked RN to reweigh and discussed with RN what patient has been eating, Monitor weights daily.  Decrease torsemide to 10mg  daily Filed Weights   10/15/22 0700 10/16/22 0656 10/17/22 0458  Weight: 79.9 kg 73.7 kg 68.5 kg   Vitals:   10/13/22 1445 10/13/22 1940 10/14/22 0608 10/14/22 1541  BP: 117/83 (!) 129/95 (!) 123/94 (!) 113/90   10/14/22 2021 10/15/22 0502 10/15/22 1325 10/15/22 1831  BP: (!) 137/94 (!) 125/95 118/81 (!) 132/91   10/16/22 0656 10/16/22 1341 10/16/22 1914 10/17/22 0458  BP: (!) 128/97 (!) 116/92 (!) 121/96 (!) 124/97    9. T2DM: Hgb A1C-10.8  (improving >15 @ 11/23). Was on Lantus 15u w/ novolog 12 units with supper>>not restarted             --monitor BS ac/hs and use SSI for elevated BS             --on SSI, Jardiance 10mg  QD -10/02/22 CBGs well controlled with 2-3U of novolog use per meal so far, monitor for trends  -3/13 increase Jardiance to 25mg   -d/c ISS, discussed good blood sugar control with patient CBG (last 3)  Recent Labs    10/16/22 1646 10/16/22 2102 10/17/22 0614  GLUCAP 132* 192* 113*    10. Severe obesity: Educate patient on diet, compliance and exercise to help promote health and mobility. Magnesium reviewed and normal.  11. Acute on Chronic renal failure/AKI: Diuretic held. Cr improved 3/13 but worsened 3/18. Decrease torsemide to 10mg . Monitor weekly. Discussed with patient.  Weight decreased, decrease torsemide to 10mg .  12. Hyperlipidemia: continue lipitor 80mg  QHS  13. Decreased initiation: increase amantadine to 200mg  daily, continue, appears to be helping, discussed improvements with therapy 14. Vitamin D insufficiency: 21.41 on 10/04/22, continue ergocalciferol 50,000U once per week for 7 weeks. Daily supplement d/ced 15. Bilateral lower extremity throbbing: -10/09/22 SCDs need adjusting, asked nursing to assist; added Robaxin 500mg  q6h PRN for leg spasms [place] reviewed and negative for clots, asking nursing to check whether SCDs are working, continue SCDs, asked nursing to apply them 16. Constipation: continue Miralax 17g to BID, add colace 100mg  QD; monitor for effect.  messaged nursing regarding when last BM was    LOS: 16 days A FACE TO FACE EVALUATION WAS PERFORMED  Clide Deutscher Tanush Drees 10/17/2022, 10:46 AM

## 2022-10-17 NOTE — Progress Notes (Signed)
Occupational Therapy Session Note  Patient Details  Name: [name] MRN: [id number] Date of Birth: [birth year]  Today's Date: 10/17/2022 OT Individual Time: 0800-0920 OT Individual Time Calculation (min): 80 min    Short Term Goals: Week 2:  OT Short Term Goal 1 (Week 2): Patient will complete squat pivot transfer to wheelchair to right or left with min assist OT Short Term Goal 2 (Week 2): Patient will don pull over shirt with min assist OT Short Term Goal 3 (Week 2): Patient will stand sufficiently for toileting hygiene or clothing management with no more than min assist OT Short Term Goal 4 (Week 2): Patient will visually scan to left of midline to locate needed ADLitems without cueing  Skilled Therapeutic Interventions/Progress Updates:    Patient received supine in bed.  Reports waking up in better spirits today.  Agreeable to OT session.  Patient continues to have difficulty rolling toward right side.  Needs cueing to assist left limbs for rolling.  Patient requires mod assist to roll and transition to sitting position. Changed wheelchair to hemi height to allow patient some opportunity to propel chair, reposition self.  Will assess effectiveness.   Patient able to propel self to sink for bathing and dressing.  Requested use of toilet prior to getting LB dressed.  Able to complete hygiene after urinating today.  Able to assist with pulling up clothing.   Worked on means to increase autonomy with LB dressing.  Used figure four sitting with Left LE to don sock.  Patient without sufficient range of motion in right hip to use this same method.   Patient left up in wheelchair with call bell/ personal items in reach.    Therapy Documentation Precautions:  Precautions Precautions: Fall Precaution Comments: Left hemiparesis Required Braces or Orthoses: Other Brace Other Brace: L Resting hand splint and L PRAFO Restrictions Weight Bearing Restrictions: No  Pain:  Denies  pain    Therapy/Group: Individual Therapy  Mariah Milling 10/17/2022, 12:20 PM

## 2022-10-18 LAB — GLUCOSE, CAPILLARY
Glucose-Capillary: 121 mg/dL — ABNORMAL HIGH (ref 70–99)
Glucose-Capillary: 144 mg/dL — ABNORMAL HIGH (ref 70–99)
Glucose-Capillary: 172 mg/dL — ABNORMAL HIGH (ref 70–99)
Glucose-Capillary: 169 mg/dL — ABNORMAL HIGH (ref 70–99)

## 2022-10-18 MED ORDER — SENNOSIDES-DOCUSATE SODIUM 8.6-50 MG PO TABS
2.0000 | ORAL_TABLET | Freq: Every day | ORAL | Status: DC | PRN
  Filled 2022-10-18: qty 2

## 2022-10-18 MED ORDER — SENNOSIDES-DOCUSATE SODIUM 8.6-50 MG PO TABS
2.0000 | ORAL_TABLET | Freq: Every day | ORAL | Status: DC
  Administered 2022-10-18 – 2022-10-29 (×11): 2 via ORAL
  Filled 2022-10-18 (×11): qty 2

## 2022-10-18 MED ORDER — MAGNESIUM GLUCONATE 500 MG PO TABS
250.0000 mg | ORAL_TABLET | Freq: Every day | ORAL | Status: DC
  Administered 2022-10-18 – 2022-10-20 (×3): 250 mg via ORAL
  Filled 2022-10-18 (×3): qty 1

## 2022-10-18 NOTE — Progress Notes (Signed)
PROGRESS NOTE   Subjective/Complaints: Patient's chart reviewed- No issues reported overnight Vitals signs stable except for elevated diastolic blood pressure to 96  ROS: + back and neck pain-improved, +decreased initiation as per therapy- improved. +throbbing in b/l lower extremities-ongoing +Constipation-improving. Denies CP, SOB, abd pain, n/v/d.    Objective:   No results found. Recent Labs    10/17/22 0646  WBC 6.4  HGB 12.1  HCT 38.0  PLT 276     Recent Labs    10/17/22 0646  NA 137  K 3.7  CL 98  CO2 29  GLUCOSE 128*  BUN 10  CREATININE 1.39*  CALCIUM 9.3      Intake/Output Summary (Last 24 hours) at 10/18/2022 1014 Last data filed at 10/18/2022 0700 Gross per 24 hour  Intake 480 ml  Output --  Net 480 ml        Physical Exam: Vital Signs Blood pressure (!) 133/96, pulse 66, temperature 99.1 F (37.3 C), temperature source Oral, resp. rate 16, height 4\' 11"  (1.499 m), weight 66.7 kg, last menstrual period 06/22/2022, SpO2 96 %.  Constitutional: No distress . Vital signs reviewed. Obese. BMI 30.50 HEENT: NCAT, EOMI, oral membranes moist.  Neck: supple Cardiovascular: RRR without murmur. No JVD    Respiratory/Chest: CTA Bilaterally without wheezes or rales. Normal effort    GI/Abdomen: BS +, non-tender, non-distended Ext: no clubbing or cyanosis, no edema Psych: very flat, depressed, very polite and respectful and appreciative Skin: dry, warm MSK: weak in L arm, able to move R arm around in bed, full MsK exam not performed.  Neurologic: Pt less tired than before. Speech dysarthric, low volume. Language appears to be intact.    PRIOR EXAM: Neurologic: Right gaze preference but can follow to left to a lesser extent. No obvious field cut. Left central 7 and tongue deviation. LUE and LLE 0/5. Senses pain and LT in all 4's. No resting tone. DTR's 1+ throughout. Toes down left foot, no clonus.     Assessment/Plan: 1. Functional deficits which require 3+ hours per day of interdisciplinary therapy in a comprehensive inpatient rehab setting. Physiatrist is providing close team supervision and 24 hour management of active medical problems listed below. Physiatrist and rehab team continue to assess barriers to discharge/monitor patient progress toward functional and medical goals  Care Tool:  Bathing    Body parts bathed by patient: Left arm, Chest, Abdomen, Front perineal area, Right upper leg, Left upper leg, Face   Body parts bathed by helper: Right arm, Buttocks, Right lower leg, Left lower leg     Bathing assist Assist Level: Moderate Assistance - Patient 50 - 74%     Upper Body Dressing/Undressing Upper body dressing   What is the patient wearing?: Pull over shirt    Upper body assist Assist Level: Moderate Assistance - Patient 50 - 74%    Lower Body Dressing/Undressing Lower body dressing      What is the patient wearing?: Incontinence brief, Pants     Lower body assist Assist for lower body dressing: Maximal Assistance - Patient 25 - 49%     Toileting Toileting Toileting Activity did not occur (Clothing management and hygiene only):  N/A (no void or bm)  Toileting assist Assist for toileting: 2 Helpers     Transfers Chair/bed transfer  Transfers assist  Chair/bed transfer activity did not occur: Safety/medical concerns  Chair/bed transfer assist level: Moderate Assistance - Patient 50 - 74%     Locomotion Ambulation   Ambulation assist   Ambulation activity did not occur: Safety/medical concerns          Walk 10 feet activity   Assist  Walk 10 feet activity did not occur: Safety/medical concerns        Walk 50 feet activity   Assist Walk 50 feet with 2 turns activity did not occur: Safety/medical concerns         Walk 150 feet activity   Assist Walk 150 feet activity did not occur: Safety/medical concerns          Walk 10 feet on uneven surface  activity   Assist Walk 10 feet on uneven surfaces activity did not occur: Safety/medical concerns         Wheelchair     Assist Is the patient using a wheelchair?: Yes Type of Wheelchair: Manual Wheelchair activity did not occur: Safety/medical concerns         Wheelchair 50 feet with 2 turns activity    Assist    Wheelchair 50 feet with 2 turns activity did not occur: Safety/medical concerns       Wheelchair 150 feet activity     Assist  Wheelchair 150 feet activity did not occur: Safety/medical concerns       Blood pressure (!) 133/96, pulse 66, temperature 99.1 F (37.3 C), temperature source Oral, resp. rate 16, height 4\' 11"  (1.499 m), weight 66.7 kg, last menstrual period 06/22/2022, SpO2 96 %.    Medical Problem List and Plan: 1. 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  Antithrombotics: -DVT/anticoagulation:  Pharmaceutical: Eliquis 5mg  BID             -antiplatelet therapy: N/A 3.Back pain/Pain Management:  Start magnesium 250mg  HS. Tylenol prn. Oxycodone making her nausea worse and ineffective--changed to MSIR 15mg  q6h PRN prior to d/c from acute, reordered here  4. Mood/Behavior/Sleep: LCSW to follow for evaluation and support.              -antipsychotic agents: N/A -pt reports chronic anxiety at baseline. Expresses that she's depressed as well currently.              -took atarax at home 10mg  tid prn. Will resume.             -begin lexapro 5mg  qhs             -neuropsych eval -10/02/22 started Trazodone 25mg  QHS and Hydroxyzine 25mg  QHS for now, monitor for effect or oversedation; left Hydroxyzine 10mg  TID PRN dosing for anxiety in place  -10/08/22 not sleeping well; added melatonin 5mg , monitor for  improvement  5. Neuropsych/cognition: This patient is not capable of making decisions on her own behalf. 6. Skin/Wound Care:  Routine pressure relief  measures.  7. Fluids/Electrolytes/Nutrition: Monitor I/O. Monitor routine labs weekly, next 10/10/22 8. Acute on  chronic combined CHF: HH diet.  -Monitor weights daily and for signs of overload. Asked nursing to weight her today -continue Torsemide 40mg  QD (>>10mg  QD), Losartan 50mg  QD, Spironolactone 25mg  QD, Jardiance 10mg  QD (>>25mg  QD), and Metoprolol 25mg  BID  -Weight reviewed 3/15 and is significantly downtrending, asked RN to reweigh and discussed with RN what patient has been eating, Monitor weights daily.  Decrease torsemide to 10mg  daily, weight decreasing, maintain this dose Filed Weights   10/16/22 0656 10/17/22 0458 10/18/22 0429  Weight: 73.7 kg 68.5 kg 66.7 kg   Vitals:   10/14/22 1541 10/14/22 2021 10/15/22 0502 10/15/22 1325  BP: (!) 113/90 (!) 137/94 (!) 125/95 118/81   10/15/22 1831 10/16/22 0656 10/16/22 1341 10/16/22 1914  BP: (!) 132/91 (!) 128/97 (!) 116/92 (!) 121/96   10/17/22 0458 10/17/22 1401 10/17/22 1939 10/18/22 0258  BP: (!) 124/97 125/89 (!) 130/94 (!) 133/96    9. T2DM: Hgb A1C-10.8  (improving >15 @ 11/23). Was on Lantus 15u w/ novolog 12 units with supper>>not restarted             --monitor BS ac/hs and use SSI for elevated BS             --on SSI, Jardiance 10mg  QD -10/02/22 CBGs well controlled with 2-3U of novolog use per meal so far, monitor for trends  -3/13 increase Jardiance to 25mg   -d/c ISS, discussed good blood sugar control with patient  Add magnesium gluconate 250mg  HS CBG (last 3)  Recent Labs    10/17/22 1633 10/17/22 2112 10/18/22 0619  GLUCAP 96 150* 121*    10. Severe obesity: Educate patient on diet, compliance and exercise to help promote health and mobility. Add magnesium gluconate 250mg  HS 11. Acute on Chronic renal failure/AKI: Diuretic held. Cr improved 3/13 but worsened  3/18. Decrease torsemide to 10mg . Monitor weekly. Discussed with patient. Weight decreased, decrease torsemide to 10mg .  12. Hyperlipidemia: continue lipitor 80mg  QHS  13. Decreased initiation: increase amantadine to 200mg  daily, continue, appears to be helping, discussed improvements with therapy 14. Vitamin D insufficiency: 21.41 on 10/04/22, continue ergocalciferol 50,000U once per week for 7 weeks. Daily supplement d/ced 15. Bilateral lower extremity throbbing: -10/09/22 SCDs need adjusting, asked nursing to assist; added Robaxin 500mg  q6h PRN for leg spasms [place] reviewed and negative for clots, asking nursing to check whether SCDs are working, continue SCDs, asked nursing to apply them 16. Constipation: continue Miralax 17g to BID, add colace 100mg  QD; monitor for effect.  messaged nursing regarding when last BM was    LOS: 17 days A FACE TO FACE EVALUATION WAS PERFORMED  Kaipo Ardis P Nardos Putnam 10/18/2022, 10:14 AM

## 2022-10-18 NOTE — Progress Notes (Signed)
Physical Therapy Session Note  Patient Details  Name: [name] MRN: [id number] Date of Birth: Aug 01, 1975  Today's Date: 10/18/2022 PT Individual Time: [id number] PT Individual Time Calculation (min): 47 min   and  Today's Date: 10/18/2022 PT Missed Time: 13 Minutes Missed Time Reason: Toileting  Short Term Goals: Week 2:  PT Short Term Goal 1 (Week 2): Pt will complete bed mobility with modA PT Short Term Goal 2 (Week 2): Pt will complete bed<>chair transfers with modA and LRAD PT Short Term Goal 3 (Week 2): Pt will ambulate 20ft with maxA and LRAD  Skilled Therapeutic Interventions/Progress Updates:    Pt received sitting EOB with other therapist present and this therapist arrived and assumed care of patient. Pt states "I don't know how much you're going to get out of me today..I didn't sleep at all last night because my stomach was hurting." Despite this, pt agreeable to participate in session as much as possible. Pt reports feeling urge to have BM - RN reports recent suppository. Sit>stand EOB>stedy with min assist - manual facilitation for L LE positioning on stedy platform and supporting L UE during transfer. Stedy transfer to [facility]. Sit<>stand from stedy seat with light min assist and manual facilitation to support L UE. Dependent LB clothing management. Pt attempted to void BM but unsuccessful, but was able to void bladder - during peri-care therapist felt firm stool - nurse notified and present to assess, reporting pt will need enema. Pt requesting to return to bed and rest due to inability to void and continuing to have increased abdominal pain. Stedy transfer back to EOB as described above. Sit>supine with heavy mod assist for trunk descent and B LE management into bed. Pt left supine in bed with needs in reach, bed alarm on, and L hemibody positioned with pillows for pressure relief and improved alignment. Missed 13 minutes of skilled physical therapy.  Therapy  Documentation Precautions:  Precautions Precautions: Fall Precaution Comments: Left hemiparesis Required Braces or Orthoses: Other Brace Other Brace: L Resting hand splint and L PRAFO Restrictions Weight Bearing Restrictions: No   Pain:  Continues to report abdominal pain/discomfort due to feeling need to have BM but unable - nurse aware and providing support to pt.    Therapy/Group: Individual Therapy  Tawana Scale , PT, DPT, NCS, CSRS 10/18/2022, 8:04 AM

## 2022-10-18 NOTE — Progress Notes (Addendum)
SLP Cancellation Note  Patient Details Name: [name] MRN: [id number] DOB: [birth year]   Cancelled treatment:        Pt missed 45 minutes of SLP session due to refusing session today. SLP provided plan for today with encouragement to participate; however, pt continued to decline and reported she needed to make an important phone call. Will attempt to make up missed time as able.                                                                                       Wyn Forster 10/18/2022, 10:14 AM

## 2022-10-18 NOTE — Progress Notes (Signed)
Physical Therapy Session Note  Patient Details  Name: [name] MRN: [id number] Date of Birth: [birth year]  Today's Date: 10/18/2022 PT Missed Time: 61 Minutes Missed Time Reason: Toileting;Patient unwilling to participate;Pain  Short Term Goals: Week 2:  PT Short Term Goal 1 (Week 2): Pt will complete bed mobility with modA PT Short Term Goal 2 (Week 2): Pt will complete bed<>chair transfers with modA and LRAD PT Short Term Goal 3 (Week 2): Pt will ambulate 10ft with maxA and LRAD  Skilled Therapeutic Interventions/Progress Updates:      Therapy Documentation Precautions:  Precautions Precautions: Fall Precaution Comments: Left hemiparesis Required Braces or Orthoses: Other Brace Other Brace: L Resting hand splint and L PRAFO Restrictions Weight Bearing Restrictions: No    Pt missed 30 minutes of skilled PT 2/2 pain. Pt reports she is constipated and unable to participate related to pain. Plan to make up missed minutes as able.    Therapy/Group: Individual Therapy  Verl Dicker Verl Dicker PT, DPT  10/18/2022, 7:52 AM

## 2022-10-18 NOTE — Progress Notes (Signed)
Speech Language Pathology Weekly Progress and Session Note  Patient Details  Name: [name] MRN: [id number] Date of Birth: May 07, 1976  Beginning of progress report period: October 11, 2022 End of progress report period: October 18, 2022  Today's Date: 10/18/2022 SLP Missed Time: 49 Minutes Missed Time Reason: Patient unwilling to participate  Short Term Goals: Week 2: SLP Short Term Goal 1 (Week 2): Patient will complete medication management tasks with min-to-mod A verbal cues to achieve 70% accuracy SLP Short Term Goal 1 - Progress (Week 2): Progressing toward goal SLP Short Term Goal 2 (Week 2): Patient will complete problem solving tasks with awareness of errors given min-to-mod A verbal cues to ID and repair SLP Short Term Goal 2 - Progress (Week 2): Not met SLP Short Term Goal 3 (Week 2): Pt will trial regular textures with functional pharyngeal and no overt s/sx concering for airway intrusion with Mod I for implementation of safe swallowing strategies. SLP Short Term Goal 3 - Progress (Week 2): Progressing toward goal SLP Short Term Goal 4 (Week 2): Pt will recall novel, functional information 15 minute delay with 80% acc with mod verbal and visual cues SLP Short Term Goal 4 - Progress (Week 2): Progressing toward goal    New Short Term Goals: Week 3: SLP Short Term Goal 1 (Week 3): Patient will complete medication management tasks with min-to-mod A verbal cues to achieve 70% accuracy SLP Short Term Goal 2 (Week 3): Patient will complete problem solving tasks with awareness of errors given min-to-mod A verbal cues to ID and repair SLP Short Term Goal 3 (Week 3): Pt will trial regular textures with functional pharyngeal and no overt s/sx concering for airway intrusion with Mod I for implementation of safe swallowing strategies. SLP Short Term Goal 4 (Week 3): Pt will recall novel, functional information 15 minute delay with 80% acc with mod verbal and visual cues  Weekly Progress  Updates:  Patient has made some progress towards short term goals this week; however, she has met 0 out of 4 STGs this reporting period. Progress appears limited by her participation in speech therapy sessions this week. Patient is currently completing cognitive tasks with overall min-moderate A and verbal  cues in regards to functional cognitive tasks. Patient is currently consuming a DYS3 diet with thin liquids liquids with min  A to implement swallowing compensatory strategies. She attempted regular meal tray trial this week; however, expressed preference to continue with Dys 3 diet at this time. Patient and family education is ongoing. Patient would benefit from continued skilled SLP intervention to maximize cognitive and swallowing functioning and overall functional independence prior to discharge.      Intensity: Minumum of 1-2 x/day, 30 to 90 minutes Frequency: 1 to 3 out of 7 days Duration/Length of Stay: 4/8 Treatment/Interventions: Cognitive remediation/compensation;Internal/external aids;Multimodal communication approach;Patient/family education;Medication managment;Dysphagia/aspiration precaution training   Daily Session  Skilled Therapeutic Interventions: See cancellation noted for details.      Pain None reported  Therapy/Group:  missed treatment session  Wyn Forster 10/18/2022, 10:23 AM

## 2022-10-18 NOTE — Progress Notes (Signed)
Has been refusing miralax frequency and no w/sever constipation and unable to be disimpacted. Will order Senna S 2 tabs with lunch as may be willing to take it. SSE today

## 2022-10-18 NOTE — Progress Notes (Signed)
Occupational Therapy Session Note  Patient Details  Name: [name] MRN: [id number] Date of Birth: 1976/01/02  Today's Date: 10/18/2022 OT Individual Time: [id number] OT Individual Time Calculation (min): 38 min  OT Individual Time: [id number] OT Individual Time Calculation (min): 30 min    Short Term Goals: Week 2:  OT Short Term Goal 1 (Week 2): Patient will complete squat pivot transfer to wheelchair to right or left with min assist OT Short Term Goal 2 (Week 2): Patient will don pull over shirt with min assist OT Short Term Goal 3 (Week 2): Patient will stand sufficiently for toileting hygiene or clothing management with no more than min assist OT Short Term Goal 4 (Week 2): Patient will visually scan to left of midline to locate needed ADLitems without cueing  Skilled Therapeutic Interventions/Progress Updates:     AM Session:  Pt received sitting up in bed presenting with flat affect receptive to skilled OT session. Greeted Pt upon arrival with Pt reporting 10/10 pain in stomach d/t constipation. Spoke with RN at beginning of session reporting she was aware of Pt constipation and will be by room soon to provide medications. Provided Pt therapeutic support and engaged Pt in light hearted conversation to support participation with mild improvement in moral. Worked on Lb dressing sitting EOM for dynamic sitting balance challenge and to increase Pt independence in dressing task. Pt leaned forward to weave LLE into pants with OT providing min A for sitting balance. Able to weave her toes requiring assistance to fully weave remainder of foot into pants. Weaved RLE by bringing foot into partial figure-4 position and leaning anteriorly. Min A to stand and bring pants to waist with assistance required to bring pants over L hip. Pt completed squat pivot to wc min A +time with min cueing provided for technique. RN entering room to provide medications. Discussed option of having suppository now vs.  following therapy session with Pt reporting she is in too much pain and will not be able to participate without relief. OT providing maximal encouragement and support however Pt continuing to request to end session early to receive suppository. Assisted Pt back to bed stand pivot using bed features min A. Pt completed sit>stand x2 to bring pants off waist. Pt returned to bed light mod A to lift LLE into bed. Pt left with RN present in room. Missed 12 minutes of skilled OT session d/t Pt abdominal pain/nursing care. Will attempt to make up time as able.   PM Session:  Pt received supine in bed presenting with flat affect and continuing to be uncomfortable d/t constipation. Pt reporting she will be receiving an enema following OT session. Pt receptive to participating despite abdominal pain with OT providing therapeutic support, listening, and motivation to support moral and participation. Focus this session functional transfer training and LUE NMR. Pt completed stand pivot transfers EOB>WC to L side with mod A. Transported total A to therapy gym for time management and energy conservation. Stand pivot wc>EOM mod A with OT facilitating increased weight bearing through LLE. Sitting EOM OT guided Pt through gentle LUE ROM with Pt presenting with increase in tone at shoulder and pectoral region. Pt initially unable to externally rotate LUE at shoulder, however following PROM noted improvement. Positioned Pt LUE on mat table to facilitate weightbearing and increase muscle activation. Guided Pt through completing lateral leans with Pt pushing through LUE to lower trunk towards mat and return up right to midline. Stand pivot EOM>wc mod A with  HHA. Transported pt back to her room in wc. Pt's father arriving at end of session with Pt presenting with improved spirits. Assisted pt with doffing pants in standing prior to returning to bed with Pt utilizing RUE to hold grab bar for balance. EOB>supine mod A to lift LLE. Pt able  to push through BLEs and utilize RUE to adjust self in bed. Pt left resting in bed with call bell in reach, bed alarm on, and all needs met.   Therapy Documentation Precautions:  Precautions Precautions: Fall Precaution Comments: Left hemiparesis Required Braces or Orthoses: Other Brace Other Brace: L Resting hand splint and L PRAFO Restrictions Weight Bearing Restrictions: No   Therapy/Group: Individual Therapy  Janey Genta 10/18/2022, 7:49 AM

## 2022-10-19 LAB — GLUCOSE, CAPILLARY
Glucose-Capillary: 127 mg/dL — ABNORMAL HIGH (ref 70–99)
Glucose-Capillary: 151 mg/dL — ABNORMAL HIGH (ref 70–99)
Glucose-Capillary: 201 mg/dL — ABNORMAL HIGH (ref 70–99)
Glucose-Capillary: 173 mg/dL — ABNORMAL HIGH (ref 70–99)

## 2022-10-19 MED ORDER — MAGNESIUM CITRATE PO SOLN
1.0000 | Freq: Once | ORAL | Status: AC
  Administered 2022-10-19: 1 via ORAL
  Filled 2022-10-19: qty 296

## 2022-10-19 MED ORDER — TORSEMIDE 20 MG PO TABS
20.0000 mg | ORAL_TABLET | Freq: Every day | ORAL | Status: DC
  Administered 2022-10-20 – 2022-10-31 (×12): 20 mg via ORAL
  Filled 2022-10-19 (×13): qty 1

## 2022-10-19 NOTE — Patient Care Conference (Signed)
Inpatient RehabilitationTeam Conference and Plan of Care Update Date: 10/19/2022   Time: 11:04 AM    Patient Name: [name]      Medical Record Number: [id number]  Date of Birth: 1976-02-12 Sex: Female         Room/Bed: 4M06C/4M06C-01 Payor Info: Payor: MEDICARE / Plan: MEDICARE PART A AND B / Product Type: *No Product type* /    Admit Date/Time:  10/01/2022  1:41 PM  Primary Diagnosis:  Acute ischemic right MCA stroke [facility])  Hospital Problems: Principal Problem:   Acute ischemic right MCA stroke ([place]) Active Problems:   Poorly controlled type 2 diabetes mellitus ([place])   Hyperlipidemia associated with type 2 diabetes mellitus (HCC)   Chronic nausea   Chronic combined systolic and diastolic heart failure (HCC)   Paroxysmal atrial fibrillation (HCC)   Severe obesity (BMI >= 40) (HCC)   Chronic pain   Insomnia   Gaze preference w/left inattention   Left hemiplegia (HCC)   Mild episode of recurrent major depressive disorder [facility])    Expected Discharge Date: Expected Discharge Date: 10/31/22  Team Members Present: Physician leading conference: Dr. Leeroy Cha Social Worker Present: Ovidio Kin, LCSW Nurse Present: Dorien Chihuahua, RN PT Present: Ginnie Smart, PT OT Present: Other (comment) Antony Salmon, OT) SLP Present: Weston Anna, SLP PPS Coordinator present : Gunnar Fusi, SLP     Current Status/Progress Goal Weekly Team Focus  Bowel/Bladder     Toileting, constipation addressed     Continent    Toileting protocol  Swallow/Nutrition/ Hydration   Dys. 3 textures with thin liquids, supervision   Supervision  tolerance of current diet, use of strategies    ADL's   mod assist BADL - Continues to show improvement with sitting and standing balance/ stand tolerance   Min assist   attention to left - body/env't, NMR LUE/LLE, Postural control- standing, functional mobility    Mobility   mod/maxA bed mobility, mod/maxA squat<>pivot transfers, minA for  sit<>stands (!), Ambulating in LiteGait overground with +2 assist   CGA to minA  Functional transfers, L NMR, gait training    Communication                Safety/Cognition/ Behavioral Observations  Min-Mod A   Sup A   attention, recall, awareness and problem solving    Pain      HA and left LE pain treated with prn meds    Pain < 4 with prns    Assess pain q shift and effectiveness of and need for prn medications  Skin      N/a           Discharge Planning:  Will start including caregivers/family in her care so can prepare for discharge. Dad getting his home ready for her   Team Discussion: Patient with increased spasms and tone on left side; using heating pad and SCDs. Constipation addressed. MD noted weight down; adjusting torsemide for edema. Continue to note flat affect but engaged in therapies. DM controlled without insulin. Progress limited by left environmental inattention and no sensation on the left side.  Patient on target to meet rehab goals: yes, making slow steady gains. Currently needs mod assist for squat pivots and stands with min assist. Tolerating a D3 thin liquid diet; continue to work on memory and problem solving. Needs min - mod assist for cognition.  *See Care Plan and progress notes for long and short-term goals.   Revisions to Treatment Plan:  Lite gait training Regular  texture diet trial - failed  Inconsistent participation with SLP; decreased SLP frequency  Teaching Needs: Safety, diet modifications, medications, transfers, toileting, etc.   Current Barriers to Discharge: Decreased caregiver support and Home enviroment access/layout  Possible Resolutions to Barriers: Family education DME: hospital bed, stedy, w/c Ramp for entry to home recommended     Medical Summary Current Status: decreased initiation, AKI, obesity, constipation, post-stroke spasticity, muscle spasms in lower extremities, type 2 diabetes  Barriers to Discharge:  Medical stability  Barriers to Discharge Comments: decreased initiation, AKI, obesity, constipation, post-stroke spasticity, muscle spasms in lower extremities, type 2 diabetes Possible Resolutions to [employer] Focus: continue amantadine 200mg  daily, continue to monitor creatinine, decreased torsemide to 10mg , continue Jardiance, magnesium citrate today, kpad ordered   Continued Need for Acute Rehabilitation Level of Care: The patient requires daily medical management by a physician with specialized training in physical medicine and rehabilitation for the following reasons: Direction of a multidisciplinary physical rehabilitation program to maximize functional independence : Yes Medical management of patient stability for increased activity during participation in an intensive rehabilitation regime.: Yes Analysis of laboratory values and/or radiology reports with any subsequent need for medication adjustment and/or medical intervention. : Yes   I attest that I was present, lead the team conference, and concur with the assessment and plan of the team.   Dorien Chihuahua B 10/19/2022, 2:41 PM

## 2022-10-19 NOTE — Progress Notes (Signed)
PROGRESS NOTE   Subjective/Complaints: Still constipated, magnesium citrate ordered today. Discussed baclofen for her lower extremity spasticity but she does not want to try due to side effect of constipation  ROS: + back and neck pain-improved, +decreased initiation as per therapy- improved. +throbbing in b/l lower extremities-continues +Constipation-improving. Denies CP, SOB, abd pain, n/v/d.    Objective:   No results found. Recent Labs    10/17/22 0646  WBC 6.4  HGB 12.1  HCT 38.0  PLT 276     Recent Labs    10/17/22 0646  NA 137  K 3.7  CL 98  CO2 29  GLUCOSE 128*  BUN 10  CREATININE 1.39*  CALCIUM 9.3      Intake/Output Summary (Last 24 hours) at 10/19/2022 1106 Last data filed at 10/19/2022 0926 Gross per 24 hour  Intake 358 ml  Output 350 ml  Net 8 ml        Physical Exam: Vital Signs Blood pressure 119/81, pulse 68, temperature 97.8 F (36.6 C), resp. rate 15, height 4\' 11"  (1.499 m), weight 68.2 kg, last menstrual period 06/22/2022, SpO2 100 %.  Constitutional: No distress . Vital signs reviewed. Obese. BMI 30.37 HEENT: NCAT, EOMI, oral membranes moist.  Neck: supple Cardiovascular: RRR without murmur. No JVD    Respiratory/Chest: CTA Bilaterally without wheezes or rales. Normal effort    GI/Abdomen: BS +, non-tender, non-distended Ext: no clubbing or cyanosis, no edema Psych: very flat, depressed, very polite and respectful and appreciative, engaging Skin: dry, warm MSK: weak in L arm, able to move R arm around in bed, full MsK exam not performed.  Neurologic: Pt less tired than before. Speech dysarthric, low volume. Language appears to be intact.    PRIOR EXAM: Neurologic: Right gaze preference but can follow to left to a lesser extent. No obvious field cut. Left central 7 and tongue deviation. LUE and LLE 0/5. Senses pain and LT in all 4's. No resting tone. DTR's 1+ throughout. Toes  down left foot, no clonus.    Assessment/Plan: 1. Functional deficits which require 3+ hours per day of interdisciplinary therapy in a comprehensive inpatient rehab setting. Physiatrist is providing close team supervision and 24 hour management of active medical problems listed below. Physiatrist and rehab team continue to assess barriers to discharge/monitor patient progress toward functional and medical goals  Care Tool:  Bathing    Body parts bathed by patient: Left arm, Chest, Abdomen, Front perineal area, Right upper leg, Left upper leg, Face   Body parts bathed by helper: Right arm, Buttocks, Right lower leg, Left lower leg     Bathing assist Assist Level: Moderate Assistance - Patient 50 - 74%     Upper Body Dressing/Undressing Upper body dressing   What is the patient wearing?: Pull over shirt    Upper body assist Assist Level: Moderate Assistance - Patient 50 - 74%    Lower Body Dressing/Undressing Lower body dressing      What is the patient wearing?: Incontinence brief, Pants     Lower body assist Assist for lower body dressing: Maximal Assistance - Patient 25 - 49%     Toileting Toileting Toileting Activity did not  occur Landscape architect and hygiene only): N/A (no void or bm)  Toileting assist Assist for toileting: 2 Helpers     Transfers Chair/bed transfer  Transfers assist  Chair/bed transfer activity did not occur: Safety/medical concerns  Chair/bed transfer assist level: Moderate Assistance - Patient 50 - 74%     Locomotion Ambulation   Ambulation assist   Ambulation activity did not occur: Safety/medical concerns          Walk 10 feet activity   Assist  Walk 10 feet activity did not occur: Safety/medical concerns        Walk 50 feet activity   Assist Walk 50 feet with 2 turns activity did not occur: Safety/medical concerns         Walk 150 feet activity   Assist Walk 150 feet activity did not occur:  Safety/medical concerns         Walk 10 feet on uneven surface  activity   Assist Walk 10 feet on uneven surfaces activity did not occur: Safety/medical concerns         Wheelchair     Assist Is the patient using a wheelchair?: Yes Type of Wheelchair: Manual Wheelchair activity did not occur: Safety/medical concerns         Wheelchair 50 feet with 2 turns activity    Assist    Wheelchair 50 feet with 2 turns activity did not occur: Safety/medical concerns       Wheelchair 150 feet activity     Assist  Wheelchair 150 feet activity did not occur: Safety/medical concerns       Blood pressure 119/81, pulse 68, temperature 97.8 F (36.6 C), resp. rate 15, height 4\' 11"  (1.499 m), weight 68.2 kg, last menstrual period 06/22/2022, SpO2 100 %.    Medical Problem List and Plan: 1. 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  Antithrombotics: -DVT/anticoagulation:  Pharmaceutical: Eliquis 5mg  BID             -antiplatelet therapy: N/A 3.Back pain/Pain Management:  Continue magnesium 250mg  HS. Tylenol prn. Oxycodone making her nausea worse and ineffective--changed to MSIR 15mg  q6h PRN prior to d/c from acute, reordered here  4. Mood/Behavior/Sleep: LCSW to follow for evaluation and support.              -antipsychotic agents: N/A -pt reports chronic anxiety at baseline. Expresses that she's depressed as well currently.              -took atarax at home 10mg  tid prn. Will resume.             -continue lexapro 5mg  qhs             -neuropsych eval -10/02/22 started Trazodone 25mg  QHS and Hydroxyzine 25mg  QHS for now, monitor for effect or oversedation; left Hydroxyzine 10mg  TID PRN dosing for anxiety in place  -10/08/22 not sleeping well; added melatonin 5mg ,  monitor for improvement  5. Neuropsych/cognition: This patient is not capable of making decisions on her own behalf. 6. Skin/Wound Care:  Routine pressure relief  measures.  7. Fluids/Electrolytes/Nutrition: Monitor I/O. Monitor routine labs weekly, next 10/10/22 8.  Acute on chronic combined CHF: HH diet.  -Monitor weights daily and for signs of overload. Asked nursing to weight her today -continue Torsemide 40mg  QD (>>10mg  QD), Losartan 50mg  QD, Spironolactone 25mg  QD, Jardiance 10mg  QD (>>25mg  QD), and Metoprolol 25mg  BID  -Weight reviewed 3/15 and is significantly downtrending, asked RN to reweigh and discussed with RN what patient has been eating, Monitor weights daily.  Increase torsemide back to 20mg  daily Filed Weights   10/17/22 0458 10/18/22 0429 10/19/22 0500  Weight: 68.5 kg 66.7 kg 68.2 kg   Vitals:   10/15/22 1325 10/15/22 1831 10/16/22 0656 10/16/22 1341  BP: 118/81 (!) 132/91 (!) 128/97 (!) 116/92   10/16/22 1914 10/17/22 0458 10/17/22 1401 10/17/22 1939  BP: (!) 121/96 (!) 124/97 125/89 (!) 130/94   10/18/22 0258 10/18/22 1401 10/18/22 1942 10/19/22 0549  BP: (!) 133/96 120/89 115/83 119/81    9. T2DM: Hgb A1C-10.8  (improving >15 @ 11/23). Was on Lantus 15u w/ novolog 12 units with supper>>not restarted             --monitor BS ac/hs and use SSI for elevated BS             --on SSI, Jardiance 10mg  QD -10/02/22 CBGs well controlled with 2-3U of novolog use per meal so far, monitor for trends  -3/13 increase Jardiance to 25mg   -d/c ISS, discussed good blood sugar control with patient  Add magnesium gluconate 250mg  HS CBG (last 3)  Recent Labs    10/18/22 1656 10/18/22 2053 10/19/22 0551  GLUCAP 172* 169* 127*    10. Severe obesity: Educate patient on diet, compliance and exercise to help promote health and mobility. Add magnesium gluconate 250mg  HS 11. Acute on Chronic renal failure/AKI: Diuretic held. Cr improved 3/13 but worsened 3/18. Decrease torsemide to 10mg .  Monitor weekly. Discussed with patient. Weight decreased, decrease torsemide to 10mg .  12. Hyperlipidemia: continue lipitor 80mg  QHS  13. Decreased initiation: increase amantadine to 200mg  daily, continue, appears to be helping, discussed improvements with therapy 14. Vitamin D insufficiency: 21.41 on 10/04/22, continue ergocalciferol 50,000U once per week for 7 weeks. Daily supplement d/ced 15. Bilateral lower extremity throbbing: -10/09/22 SCDs need adjusting, asked nursing to assist; added Robaxin 500mg  q6h PRN for leg spasms [place] reviewed and negative for clots, asking nursing to check whether SCDs are working, continue SCDs, asked nursing to apply them 16. Constipation: continue Miralax 17g to BID, add colace 100mg  QD; monitor for effect.  messaged nursing regarding when last BM was Magnesium citrate ordered 3/27    LOS: 18 days A FACE TO [place] 10/19/2022, 11:06 AM

## 2022-10-19 NOTE — Progress Notes (Signed)
Occupational Therapy Weekly Progress Note  Patient Details  Name: [name] MRN: [id number] Date of Birth: 05-Dec-1975  Beginning of progress report period: October 11, 2022 End of progress report period: October 19, 2022  Today's Date: 10/19/2022 OT Individual Time: [id number] OT Individual Time Calculation (min): 74 min    Patient has met 4 of 4 short term goals.  Patient with continued improvement in balance sitting/standing, and functional mobility. Patient with improved performance in all aspects of bathing/dressing/ toileting.    Patient continues to demonstrate the following deficits: abnormal tone, unbalanced muscle activation, decreased coordination, and decreased motor planning, decreased visual perceptual skills and field cut, decreased midline orientation, decreased attention to left, and decreased motor planning, decreased initiation, decreased attention, decreased problem solving, decreased safety awareness, and delayed processing, and decreased sitting balance, decreased standing balance, decreased postural control, hemiplegia, and decreased balance strategies and therefore will continue to benefit from skilled OT intervention to enhance overall performance with BADL.  Patient progressing toward long term goals..  Continue plan of care.  OT Short Term Goals Week 3:  OT Short Term Goal 1 (Week 3): Patient will don pull over shirt with set up assistance OT Short Term Goal 2 (Week 3): Patient will don pants with min assist OT Short Term Goal 3 (Week 3): Patient will complete toileting with min assist OT Short Term Goal 4 (Week 3): Patient will shower with min assist Week 4:     Skilled Therapeutic Interventions/Progress Updates:    Patient received fully dressed up in wheelchair agreeable to work in gym.  Transported patient to gym to address functional transfers (to left and right - slightly uphill/downhill)  Patient feels best with stand step transfers yet needs assistance to  move left leg.  Patient showing improved ability to scoot left hip back once seated.  Showing some ability to navigate with hemi height wheelchair.   Worked in supine, then rolling to sidelying on either side to address oblique and proximal shoulder girdle musculature.  Patient with absent sensation in LUE.  Patient is beginning to activate elbow flexion volitionally.  Patient with increased tension noted in elbow flexors during challenging movements, e.g. transfers.  Trace movement seen in thumb and index finger - but no control.   Transported back to room and left up in wheelchair with call bell and personal items in reach.    Therapy Documentation Precautions:  Precautions Precautions: Fall Precaution Comments: Left hemiparesis Required Braces or Orthoses: Other Brace Other Brace: L Resting hand splint and L PRAFO Restrictions Weight Bearing Restrictions: No   Pain:  Denies pain    Therapy/Group: Individual Therapy  Mariah Milling 10/19/2022, 11:27 AM

## 2022-10-19 NOTE — Progress Notes (Signed)
Physical Therapy Weekly Progress Note  Patient Details  Name: [name] MRN: [id number] Date of Birth: 1976/04/30  Beginning of progress report period: October 10, 2022 End of progress report period: October 19, 2022  Today's Date: 10/19/2022 [name] Individual Time: [id number] + [id number] + [id number] [name] Individual Time Calculation (min): 43 min  + 30 min + 59 min  Patient has met 3 of 3 short term goals. [name] is making appropriate progress towards LTG of minA. Currently, she requires modA for bed mobility with hospital bed features, minA for sit<>stands, modA for bed<>chair transfers, and +2 maxA for ambulating in LiteGait overground with partial BWS. She's demonstrating improved initiation, motor planning, and awareness. She's also showing slow return of strength in her LLE, primarily quad. Anticipate she will be wheelchair level at DC, and will begin to schedule and focus therapies on DC planning and family education/training.   Patient continues to demonstrate the following deficits muscle weakness and muscle joint tightness, decreased cardiorespiratoy endurance, impaired timing and sequencing, abnormal tone, unbalanced muscle activation, motor apraxia, and decreased coordination, decreased attention to left and decreased motor planning, decreased initiation, decreased attention, decreased awareness, decreased problem solving, decreased safety awareness, and delayed processing, and decreased sitting balance, decreased standing balance, decreased postural control, hemiplegia, and decreased balance strategies and therefore will continue to benefit from skilled [name] intervention to increase functional independence with mobility.  Patient progressing toward long term goals..  Continue plan of care.  [name] Short Term Goals Week 2:  [name] Short Term Goal 1 (Week 2): [name] will complete bed mobility with modA [name] Short Term Goal 1 - Progress (Week 2): Met [name] Short Term Goal 2 (Week 2): [name] will complete  bed<>chair transfers with modA and LRAD [name] Short Term Goal 2 - Progress (Week 2): Met [name] Short Term Goal 3 (Week 2): [name] will ambulate 79ft with maxA and LRAD [name] Short Term Goal 3 - Progress (Week 2): Met Week 3:  [name] Short Term Goal 1 (Week 3): STG = LTG due to ELOS  Skilled Therapeutic Interventions/Progress Updates:       1st session: ;[name] presents in bed to start - reports 6/10 constipation pain but also reports it's better than yesterday. Discussed benefits of mobility in terms of assisting with managing constipation and patient in agreement. MD also present for morning rounding and discussed constipation management as well as spasticity management for her LLE (plan to start with heating pad).   Patient requesting to use toilet prior to leaving room to try and have BM. Supine<>Sitting EOB with hospital bed features with modA. Used Stedy (minA) to dependently transfer patient to toilet in the bathroom. Patient with improved initiation and motor planning compared to prior sessions. Patient continent of bladder and small firm BM - NT made aware for charting purposes. Returned to her w/c and assisted with UB/LB dressing - maxA overall for time management. Attempted to locate bilateral hemi height leg rests as the leg rests currently on her chair do not fit - unable to locate in DME closet given time constraints but will continue to look. [name] left sitting in w/c with 1/2 lap tray on, all needs met, call bell lin reach.  2nd session: [name] sitting in w/c to start - No reports of pain. Reporting urgent need to have BM due to receiving recent laxatives. Used Stedy for urgency. Sit<>Stand in [place] with minA for powering to rise. Able to sit in perched position with supervision. Dependently transported to toilet in  bathroom and patient needing assist for controlled lowering to low toilet height. [name] continent of bladder and small, firm BM - documented in chart.   Transported to main rehab gym in w/c for time.    Stand step transfer with min/modA with [name] blocking LLE to prevent buckling with stance phase. Sitting edge of mat with supervision while unsupported.   Worked on closed chain LLE strengthening NMR with sit<>stands, standing weight shifts, TKE, and using mirror for visual feedback. Patient lacks sensory component for L side, thus making awareness difficult for placement and positioning. Quad activation felt with stance phase and weight bearing activities.   Returned to her room and patient remained seated in w/c to conclude session, all needs met.   3rd session: [name] presents in wheelchair to start - reports another urgent need to use bathroom to have a BM. Used Stedy, as documented above, to transfer patient to toilet due to urgency. [name] continent of bladder only despite urge for BM. Returned to her w/c via stedy and then transported to main rehab gym for time. Focused remainder of session on facilitating NMR gait training. Retrieved Madelaine Bhat for L foot for maximal knee stability during stance phase. Donned AFO and tennis shoes with totalA. Gait training +2 modA via 3-musketeers 22ft - used leg lifter during gait to help facilitate LLE gait mechanics. Patient needing minimal trunk support and relying most on assist for LLE management (totalA). Gait training same distance but used RW with +2 modA - [name] on stool to facilitate LLE during gait cycle. Also used foot cover for toe cap. [name] returned to her room and assisted to bed with modA stand pivot transfer. modA for bed mobility - concluded session supine with pillows supporting L hemibody. All needs met with call bell in reach, alarm on.    Therapy Documentation Precautions:  Precautions Precautions: Fall Precaution Comments: Left hemiparesis Required Braces or Orthoses: Other Brace Other Brace: L Resting hand splint and L PRAFO Restrictions Weight Bearing Restrictions: No General:    Therapy/Group: Individual Therapy  Aviyanna Colbaugh P [place]  [name] 10/19/2022, 7:40 AM

## 2022-10-20 LAB — GLUCOSE, CAPILLARY
Glucose-Capillary: 114 mg/dL — ABNORMAL HIGH (ref 70–99)
Glucose-Capillary: 178 mg/dL — ABNORMAL HIGH (ref 70–99)
Glucose-Capillary: 131 mg/dL — ABNORMAL HIGH (ref 70–99)
Glucose-Capillary: 192 mg/dL — ABNORMAL HIGH (ref 70–99)

## 2022-10-20 MED ORDER — POLYETHYLENE GLYCOL 3350 17 G PO PACK
17.0000 g | PACK | Freq: Every day | ORAL | Status: DC
  Administered 2022-10-22 – 2022-10-28 (×2): 17 g via ORAL
  Filled 2022-10-20 (×9): qty 1

## 2022-10-20 NOTE — Progress Notes (Signed)
Occupational Therapy Session Note  Patient Details  Name: [name] MRN: [id number] Date of Birth: 03-25-1976  Today's Date: 10/20/2022 OT Individual Time: [id number] OT Individual Time Calculation (min): 58 min    Short Term Goals: Week 3:  OT Short Term Goal 1 (Week 3): Patient will don pull over shirt with set up assistance OT Short Term Goal 2 (Week 3): Patient will don pants with min assist OT Short Term Goal 3 (Week 3): Patient will complete toileting with min assist OT Short Term Goal 4 (Week 3): Patient will shower with min assist  Skilled Therapeutic Interventions/Progress Updates:  Pt greeted supine in bed, pt agreeable to OT intervention. Session focus on BADL reeducation, functional mobility, dynamic standing balance and decreasing overall caregiver burden.     Pt completed supine>sit to R EOB with MODA needing assist to maneuver LLE to EOB but able to elevate trunk into sitting with use of bed features. Pt completed stand pivot transfers throughout session with MOD A to R and L side with pt needing total A to maneuver LLE during transfers.   Pt declined shower as it was "too cold" but agreeable to sink bath. Overall pt completed bathing with MODA. Education provided on hemitechniques for bathing during session. Pt able to stand with MIN A while pt washed LB with RUE.   Pt donned OH shirt with overall MODA but able to recall hemi technique. Pt donned pants with MOD A needing asssit to figure 4 LLE but able to thread pants over LLE with assist. Pt stood with MIN A but needed overall MOD A to pull pants to waist line mostly on L side.   Pt completed seated groomign tasks at sink with MIN A, education provided on hemi technique for oral care.   Ended session with pt supine in bed with bed alarm activated and all needs within reach.                 Therapy Documentation Precautions:  Precautions Precautions: Fall Precaution Comments: Left hemiparesis Required Braces or  Orthoses: Other Brace Other Brace: L Resting hand splint and L PRAFO Restrictions Weight Bearing Restrictions: No  Pain: unrated pain reported in L hip, rest breaks provided as needed.     Therapy/Group: Individual Therapy  Corinne Ports [facility] 10/20/2022, 11:23 AM

## 2022-10-20 NOTE — Progress Notes (Addendum)
Patient ID: [name], female   DOB: February 10, 1976, 47 y.o.   MRN: [id number]  Met with pt yesterday and spoke with Dad today via telephone to give him the team conference update regarding progress in therapies. She is making slow and steady progress but will still require assistance at discharge-min assist. Dad reports he needs the ramp instructions since his granddaughter has them and not call him back. Have asked Christian-PT to reach out to pt's Dad to give him the measurements. Discussed the need for hands on education Dad will come next week for this and let me know when he can. Continue to work on discharge needs. Pt is in agreement with this plan.  2;19 PM Met with daughter when here she and her granddad are fighting over taking pt to their homes at discharge. Daughter feels he can not provide the physical assist she will need and require. Have set up daughter's family education for Thursday 4/4 from 1:00-3:30 she reports her fiance will come with her. If pt's dad wants to do hands on will set up another time for him to be here.

## 2022-10-20 NOTE — Progress Notes (Signed)
PROGRESS NOTE   Subjective/Complaints: She was able to have multiple BM after receiving magnesium citrate yesterday, she had BM through the night No other complaints this morning  ROS: + back and neck pain-improved, +decreased initiation as per therapy- improved. +throbbing in b/l lower extremities-continues +Constipation-resolved with magnesium citrate. Denies CP, SOB, abd pain, n/v/d.    Objective:   No results found. No results for input(s): "WBC", "HGB", "HCT", "PLT" in the last 72 hours.    No results for input(s): "NA", "K", "CL", "CO2", "GLUCOSE", "BUN", "CREATININE", "CALCIUM" in the last 72 hours.     Intake/Output Summary (Last 24 hours) at 10/20/2022 0957 Last data filed at 10/20/2022 0759 Gross per 24 hour  Intake 828 ml  Output 100 ml  Net 728 ml        Physical Exam: Vital Signs Blood pressure 127/89, pulse 60, temperature 97.7 F (36.5 C), temperature source Oral, resp. rate 18, height 4\' 11"  (1.499 m), weight 69.2 kg, last menstrual period 06/22/2022, SpO2 100 %.  Constitutional: No distress . Vital signs reviewed. Obese. BMI 30.81 HEENT: NCAT, EOMI, oral membranes moist.  Neck: supple Cardiovascular: RRR without murmur. No JVD    Respiratory/Chest: CTA Bilaterally without wheezes or rales. Normal effort    GI/Abdomen: BS +, non-tender, non-distended Ext: no clubbing or cyanosis, no edema Psych: very flat, depressed, very polite and respectful and appreciative, engaging Skin: dry, warm MSK: weak in L arm, able to move R arm around in bed, full MsK exam not performed.  Neurologic: Pt less tired than before. Speech dysarthric, low volume. Language appears to be intact.    PRIOR EXAM: Neurologic: Right gaze preference but can follow to left to a lesser extent. No obvious field cut. Left central 7 and tongue deviation. LUE and LLE 0/5. Senses pain and LT in all 4's. No resting tone. DTR's 1+  throughout. Toes down left foot, no clonus. Improved balance   Assessment/Plan: 1. Functional deficits which require 3+ hours per day of interdisciplinary therapy in a comprehensive inpatient rehab setting. Physiatrist is providing close team supervision and 24 hour management of active medical problems listed below. Physiatrist and rehab team continue to assess barriers to discharge/monitor patient progress toward functional and medical goals  Care Tool:  Bathing    Body parts bathed by patient: Left arm, Chest, Abdomen, Front perineal area, Right upper leg, Left upper leg, Face, Right lower leg, Left lower leg   Body parts bathed by helper: Buttocks, Right arm     Bathing assist Assist Level: Minimal Assistance - Patient > 75%     Upper Body Dressing/Undressing Upper body dressing   What is the patient wearing?: Pull over shirt    Upper body assist Assist Level: Minimal Assistance - Patient > 75%    Lower Body Dressing/Undressing Lower body dressing      What is the patient wearing?: Incontinence brief, Pants     Lower body assist Assist for lower body dressing: Moderate Assistance - Patient 50 - 74%     Toileting Toileting Toileting Activity did not occur (Clothing management and hygiene only): N/A (no void or bm)  Toileting assist Assist for toileting: Moderate Assistance -  Patient 50 - 74%     Transfers Chair/bed transfer  Transfers assist  Chair/bed transfer activity did not occur: Safety/medical concerns  Chair/bed transfer assist level: Minimal Assistance - Patient > 75%     Locomotion Ambulation   Ambulation assist   Ambulation activity did not occur: Safety/medical concerns          Walk 10 feet activity   Assist  Walk 10 feet activity did not occur: Safety/medical concerns        Walk 50 feet activity   Assist Walk 50 feet with 2 turns activity did not occur: Safety/medical concerns         Walk 150 feet activity   Assist  Walk 150 feet activity did not occur: Safety/medical concerns         Walk 10 feet on uneven surface  activity   Assist Walk 10 feet on uneven surfaces activity did not occur: Safety/medical concerns         Wheelchair     Assist Is the patient using a wheelchair?: Yes Type of Wheelchair: Manual Wheelchair activity did not occur: Safety/medical concerns         Wheelchair 50 feet with 2 turns activity    Assist    Wheelchair 50 feet with 2 turns activity did not occur: Safety/medical concerns       Wheelchair 150 feet activity     Assist  Wheelchair 150 feet activity did not occur: Safety/medical concerns       Blood pressure 127/89, pulse 60, temperature 97.7 F (36.5 C), temperature source Oral, resp. rate 18, height 4\' 11"  (1.499 m), weight 69.2 kg, last menstrual period 06/22/2022, SpO2 100 %.    Medical Problem List and Plan: 1. 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  Antithrombotics: -DVT/anticoagulation:  Pharmaceutical: Eliquis 5mg  BID             -antiplatelet therapy: N/A 3.Back pain/Pain Management:  Continue magnesium 250mg  HS. Tylenol prn. Oxycodone making her nausea worse and ineffective--changed to MSIR 15mg  q6h PRN prior to d/c from acute, reordered here  4. Mood/Behavior/Sleep: LCSW to follow for evaluation and support.              -antipsychotic agents: N/A -pt reports chronic anxiety at baseline. Expresses that she's depressed as well currently.              -took atarax at home 10mg  tid prn. Will resume.             -continue lexapro 5mg  qhs             -neuropsych eval -10/02/22 started Trazodone 25mg  QHS and Hydroxyzine 25mg  QHS for now, monitor for effect or oversedation; left Hydroxyzine 10mg  TID PRN dosing for  anxiety in place  -10/08/22 not sleeping well; added melatonin 5mg , monitor for improvement  5. Neuropsych/cognition: This patient is not capable of making decisions on her own behalf. 6. Skin/Wound Care:  Routine pressure relief  measures.  7. Fluids/Electrolytes/Nutrition: Monitor I/O. Monitor routine labs weekly, next 10/10/22 8. Acute on chronic combined CHF: HH diet.  -Monitor weights daily and for  signs of overload. Asked nursing to weight her today -continue Torsemide 40mg  QD (>>10mg  QD), Losartan 50mg  QD, Spironolactone 25mg  QD, Jardiance 10mg  QD (>>25mg  QD), and Metoprolol 25mg  BID  -Weight reviewed 3/15 and is significantly downtrending, asked RN to reweigh and discussed with RN what patient has been eating, Monitor weights daily.  Increase torsemide back to 20mg  daily Filed Weights   10/18/22 0429 10/19/22 0500 10/20/22 0550  Weight: 66.7 kg 68.2 kg 69.2 kg   Vitals:   10/16/22 1341 10/16/22 1914 10/17/22 0458 10/17/22 1401  BP: (!) 116/92 (!) 121/96 (!) 124/97 125/89   10/17/22 1939 10/18/22 0258 10/18/22 1401 10/18/22 1942  BP: (!) 130/94 (!) 133/96 120/89 115/83   10/19/22 0549 10/19/22 1409 10/19/22 2123 10/20/22 0548  BP: 119/81 123/89 (!) 140/95 127/89    9. T2DM: Hgb A1C-10.8  (improving >15 @ 11/23). Was on Lantus 15u w/ novolog 12 units with supper>>not restarted             --monitor BS ac/hs and use SSI for elevated BS             --on SSI, Jardiance 10mg  QD -10/02/22 CBGs well controlled with 2-3U of novolog use per meal so far, monitor for trends  -3/13 increase Jardiance to 25mg   -d/c ISS, discussed good blood sugar control with patient  Add magnesium gluconate 250mg  HS CBG (last 3)  Recent Labs    10/19/22 1711 10/19/22 2137 10/20/22 0652  GLUCAP 201* 173* 114*    10. Severe obesity: Educate patient on diet, compliance and exercise to help promote health and mobility. Add magnesium gluconate 250mg  HS 11. Acute on Chronic renal failure/AKI: Diuretic held.  Cr improved 3/13 but worsened 3/18. Decrease torsemide to 10mg . Monitor weekly. Discussed with patient. Weight decreased, decrease torsemide to 10mg .  12. Hyperlipidemia: continue lipitor 80mg  QHS  13. Decreased initiation: increase amantadine to 200mg  daily, continue, appears to be helping, discussed improvements with therapy 14. Vitamin D insufficiency: 21.41 on 10/04/22, continue ergocalciferol 50,000U once per week for 7 weeks. Daily supplement d/ced 15. Bilateral lower extremity throbbing: -10/09/22 SCDs need adjusting, asked nursing to assist; added Robaxin 500mg  q6h PRN for leg spasms [place] reviewed and negative for clots, asking nursing to check whether SCDs are working, continue SCDs, asked nursing to apply them Discussed baclofen but she defers due to side effect of constipation.  16. Constipation: Add colace 100mg  QD; monitor for effect.  messaged nursing regarding when last BM was Magnesium citrate ordered 3/27 with good effect. Decrease Miralax to daily.     LOS: 19 days A FACE TO FACE EVALUATION WAS PERFORMED  Izora Ribas 10/20/2022, 9:57 AM

## 2022-10-20 NOTE — Progress Notes (Signed)
Physical Therapy Session Note  Patient Details  Name: [name] MRN: [id number] Date of Birth: [birth year]  Today's Date: 10/20/2022 PT Individual Time: 1000-1054 + 1300-1410 PT Individual Time Calculation (min): 54 min + 70 min  Short Term Goals: Week 3:  PT Short Term Goal 1 (Week 3): STG = LTG due to ELOS  Skilled Therapeutic Interventions/Progress Updates:      1st session: Pt in bed, reporting urgent need to void - unable to hold and was incontinent while getting to the bathroom. Used Stedy for urgency and required changing of brief (wet) and pants (wet). TotalA for time management for LB dressing. Donned tennis shoes and L GRAFO with dependent assist. Pt requires minA for standing in [place].  Transported to main rehab gym for time and focused remainder of session on NMR gait training. Used R hand rail and +2 assist for w/c follow for safety - ambulated 48ft with PT needing to provide totalA for facilitating LLE during gait cycle. Patient confuses her L vs R with verbal cueing and responds better to "stronger vs weaker" for verbal cues. Continued NMR gait training with 3-muskateers technique and ambulated 2x5ft with seated rest breaks, similar assist and cues as above. Pt frequently reports her L leg is "dead" and she can't move it, but she does demonstrate quad activation in stance - minimal hip/knee flexion in swing.   Returned to her room and patient concluded session seated in w/c with 1/2 lap tray on, all needs met.    2nd session: Pt sitting in w/c to start with her daughter present during treatment. Pt denies pain.  Focused session on family education and hands on training. Daughter reports uncertainty regarding DC plan - whether to DC to her home vs patient's husband's home. Daughter feels that her father can't provide adequate assist for the patient. Encouraged them to discuss needs and come up with a plan.  Transported patient to gym space to demonstrate to daughter  patient's ability to ambulate and bed<>chair transfers. With +2 assist from rehab tech, ambulated 22ft with mod/maxA, PT facilitating hemi gait with L GRAFO and trunk support, using 3-muskateers technique. Discussed with daughter recommendation for w/c level only until further therapies develop safe functional ambulation - daughter voiced understanding.  Reviewed stand and squat pivot transfers with the daughter. Educated on positioning, guarding, and technique. Discussed importance of monitoring L foot placement for adequate BOS during transfer and also discussed increased difficulty based on surface height and direction. Daughter was able to perform x4 transfers total with PT providing CGA to close SBA for added safety. Daughter demonstrated adequate ability to complete but would benefit from further practice.  Reviewed car transfers with daughter - reports the original plan was to use a [employer] to transport patient home. Educated them on difference in height b/w car and wheelchair could be difficult. Completed a car transfer with car height at 26" - patient needing modA to enter vehicle with totalA for BLE management. She needed minA to exit vehicle while transferring to her R side. Continued education to daughter on technique and positioning.   Assisted patient onto Nustep and setupA needed for BLE with theraband wrap around ankles to help hold in place. Completed x4 minutes at L4 resistance using BLE and RUE until patient reported urgent need to void.  Returned to her room and used Stedy for urgency to safely transfer patient onto toilet. Patient continent of bladder (charted) and then returned to bed. maxA needed for sit>supine. Heating  pad applied to LLE for tone management. Pillows provided on L side for support. Call bell in lap and bed alarm on. Daughter remaining at the bedside.     Therapy Documentation Precautions:  Precautions Precautions: Fall Precaution Comments: Left  hemiparesis Required Braces or Orthoses: Other Brace Other Brace: L Resting hand splint and L PRAFO Restrictions Weight Bearing Restrictions: No General:      Therapy/Group: Individual Therapy  [name] 10/20/2022, 7:37 AM

## 2022-10-21 ENCOUNTER — Other Ambulatory Visit (HOSPITAL_COMMUNITY): Payer: Self-pay

## 2022-10-21 LAB — GLUCOSE, CAPILLARY
Glucose-Capillary: 127 mg/dL — ABNORMAL HIGH (ref 70–99)
Glucose-Capillary: 166 mg/dL — ABNORMAL HIGH (ref 70–99)
Glucose-Capillary: 179 mg/dL — ABNORMAL HIGH (ref 70–99)
Glucose-Capillary: 159 mg/dL — ABNORMAL HIGH (ref 70–99)

## 2022-10-21 MED ORDER — MAGNESIUM GLUCONATE 500 MG PO TABS
500.0000 mg | ORAL_TABLET | Freq: Every day | ORAL | Status: DC
  Administered 2022-10-21 – 2022-10-31 (×11): 500 mg via ORAL
  Filled 2022-10-21 (×11): qty 1

## 2022-10-21 MED ORDER — MORPHINE SULFATE 15 MG PO TABS
15.0000 mg | ORAL_TABLET | Freq: Three times a day (TID) | ORAL | Status: DC | PRN
  Administered 2022-10-21 – 2022-10-31 (×12): 15 mg via ORAL
  Filled 2022-10-21 (×13): qty 1

## 2022-10-21 NOTE — Progress Notes (Signed)
Occupational Therapy Session Note  Patient Details  Name: [name] MRN: [id number] Date of Birth: 1976-05-21  Today's Date: 10/21/2022 OT Individual Time: 1000-1115 OT Individual Time Calculation (min): 75 min    Short Term Goals: Week 3:  OT Short Term Goal 1 (Week 3): Patient will don pull over shirt with set up assistance OT Short Term Goal 2 (Week 3): Patient will don pants with min assist OT Short Term Goal 3 (Week 3): Patient will complete toileting with min assist OT Short Term Goal 4 (Week 3): Patient will shower with min assist  Skilled Therapeutic Interventions/Progress Updates:    Patient received supine in bed - dressed.  She declined a shower, stating "I washed last night."  Patient tearful at beginning of session- "I am going home next week and I cannot even take myself to the bathroom."  Offered patient neuropsychology as an option to address her mood/ emotional state.  Patient declining.   Patient requesting need to void.  Assisted to toilet.  Working to increase patient's active participation and problem solving through stand step transfer.  Patient agreeable to gym workout.  Worked on sit to stand with right rail.  Patient at end of session, able to transition to standing with cueing.  Worked on stand step - weight shifting in standing, and midline orientation.  Patient returned to room and left up in wheelchair with personal items in reach.    Therapy Documentation Precautions:  Precautions Precautions: Fall Precaution Comments: Left hemiparesis Required Braces or Orthoses: Other Brace Other Brace: L Resting hand splint and L PRAFO Restrictions Weight Bearing Restrictions: No  Pain: Pain Assessment Pain Scale: 0-10 Pain Score: 0-No pain      Therapy/Group: Individual Therapy  Mariah Milling 10/21/2022, 12:25 PM

## 2022-10-21 NOTE — Progress Notes (Signed)
Physical Therapy Session Note  Patient Details  Name: [name] MRN: [id number] Date of Birth: 11-27-1975  Today's Date: 10/21/2022 PT Individual Time: 0804-0918 PT Individual Time Calculation (min): 74 min   Short Term Goals: Week 3:  PT Short Term Goal 1 (Week 3): STG = LTG due to ELOS  Skilled Therapeutic Interventions/Progress Updates:    Patient received in bed at start of session and agreeable to therapy. Mod-Max Assist to move supine>sit with pt using Rt UE on bed rail to initiate turning and raising trunk and Rt LE hooked on EOB to pivot lower body towards edge. Pt required Max assist with Lt LE to bring towards EOB and Mod assist overall to raise body to sit upright and place feet on floor. Pt requesting to dress in street clothes at start of session rather than hospital gown. Total assist to thread pant leggings on Bil LE's. Pt able to bring Rt UE out of gown sleeve, Max assist to bring Lt UE out of sleeve. Pt able to don shirt sleeve with Rt UE and Max assist to don on Lt LE with pt pulling shirt overhead using Rt UE. Total Assistt to don bil shoes with GRAFO on Lt LE. PT requesting use of bathroom and Min assist to stand with Stedy completed. Stedy transfer for urgency to bathroom and pt able to stand from paddles and lower to toilet with Min assist using Rt UE on crossbar. Pt performed pericare with mod assist ad Total assist to pull up brief and pants for time management. Stedy transfer to [facility] and pt attempted to self propel WC to hallway. Mod assist for direction of chair with cues for use of Rt UE to propel and Rt LE to guide direction of chair. Pt propelled ~ 15' and dependently rolled to [employer] for energy conservation and time management. Pt completed 3 bouts of gait, ~ 20', 25', 25', with seated rest in WC between bouts. 2HHA with Mod assist from therapist to facilitate weight shift Rt/Lt for stepping and mod assist to guide Lt foot placement and prevent external  rotation and knee buckling and hyperextension in stance; rehab tech on Rt side for HHA and stability. Pt dependently rolled outside main entrance for change of environment and performed 5x sit<>stand with Min assist, cues for technique with Rt UE press up from [facility] and reach back. Pt dependently rolled back to room at EOS for time management, pt requesting return to bed to rest until next therapy session. Mod assist for stand pivot transfer WC>EOB and Mod assist to return to supine for bringing LE's onto bed. Total assist to remove bil shoes and GRAFO from Lt LE. EOS alarm on and call bell within reach and all needs met.  Therapy Documentation Precautions:  Precautions Precautions: Fall Precaution Comments: Left hemiparesis Required Braces or Orthoses: Other Brace Other Brace: L Resting hand splint and L PRAFO Restrictions Weight Bearing Restrictions: No  Pain: Pt reports pins/needles sensation in Lt calf/lower leg, reports pain is mild and feels hypersensitive to touch. Pt reports tolerable for mobility. Per pt she feels it is her LE beginning to get more sensation back. Monitored throughout.    Therapy/Group: Individual Therapy   Verner Mould, DPT Acute Rehabilitation Services Office [phone]  10/21/22 9:35 AM

## 2022-10-21 NOTE — Progress Notes (Signed)
Physical Therapy Session Note  Patient Details  Name: [name] MRN: [id number] Date of Birth: 1975/10/12  Today's Date: 10/21/2022 PT Individual Time: 1430-1528 PT Individual Time Calculation (min): 58 min   Short Term Goals: Week 3:  PT Short Term Goal 1 (Week 3): STG = LTG due to ELOS   Skilled Therapeutic Interventions/Progress Updates:      Pt in w/c to start - reports urinary urgency. Used Stedy to transfer her to the bathroom for urgency but patient unable to hold urine, incontinent and soaked through her pants. Dependant assist for changing brief and pants for time. Documented toileting in flowsheets.  Transported to day room rehab gym. Patient requesting (politely) not to work on ambulation as she's fatigued and has "walked a lot" today with other therapies.   In rehab gym, completed activities setup by TR designed around Easter. Most activities (dying Eggs, coloring, etc) were done in standing with minA with L knee blocked to prevent buckling. Worked on reaching across midline with LUE, selective attention in busy rehab gym, and sequencing colors for coloring. Patient fatigues in standing after a few minutes and needs frequent seated rest breaks during treatment.   Patient returned to her room and assisted to bed with a modA stand step transfer. MaxA required for sit>supine for trunk and BLE management. GRAFO removed, pillows supporting L Hemibody, all needs met with alarm on.     Therapy Documentation Precautions:  Precautions Precautions: Fall Precaution Comments: Left hemiparesis Required Braces or Orthoses: Other Brace Other Brace: L Resting hand splint and L PRAFO Restrictions Weight Bearing Restrictions: No General:    Therapy/Group: Individual Therapy  [name] 10/21/2022, 7:39 AM

## 2022-10-21 NOTE — Progress Notes (Signed)
PROGRESS NOTE   Subjective/Complaints: Patient's chart reviewed- No issues reported overnight Vitals signs stable except for elevated BP, increase magnesium to 500mg  HS  ROS: + back and neck pain-improved, +decreased initiation as per therapy- improved. +throbbing in b/l lower extremities-continues +Constipation-resolved with magnesium citrate. Denies CP, SOB, abd pain, n/v/d.    Objective:   No results found. No results for input(s): "WBC", "HGB", "HCT", "PLT" in the last 72 hours.    No results for input(s): "NA", "K", "CL", "CO2", "GLUCOSE", "BUN", "CREATININE", "CALCIUM" in the last 72 hours.     Intake/Output Summary (Last 24 hours) at 10/21/2022 0918 Last data filed at 10/21/2022 0742 Gross per 24 hour  Intake 837 ml  Output 200 ml  Net 637 ml        Physical Exam: Vital Signs Blood pressure (!) 143/98, pulse 69, temperature 97.9 F (36.6 C), temperature source Oral, resp. rate 17, height 4\' 11"  (1.499 m), weight 69.3 kg, last menstrual period 06/22/2022, SpO2 100 %.  Constitutional: No distress . Vital signs reviewed. Obese. BMI 30.86 HEENT: NCAT, EOMI, oral membranes moist.  Neck: supple Cardiovascular: RRR without murmur. No JVD    Respiratory/Chest: CTA Bilaterally without wheezes or rales. Normal effort    GI/Abdomen: BS +, non-tender, non-distended Ext: no clubbing or cyanosis, no edema Psych: very flat, depressed, very polite and respectful and appreciative, engaging Skin: dry, warm MSK: weak in L arm, able to move R arm around in bed, full MsK exam not performed.  Neurologic: Pt less tired than before. Speech dysarthric, low volume. Language appears to be intact.    PRIOR EXAM: Neurologic: Right gaze preference but can follow to left to a lesser extent. No obvious field cut. Left central 7 and tongue deviation. LUE and LLE 0/5. MAS 1 throughout left side Senses pain and LT in all 4's. No resting  tone. DTR's 1+ throughout. Toes down left foot, no clonus. Improved balance   Assessment/Plan: 1. Functional deficits which require 3+ hours per day of interdisciplinary therapy in a comprehensive inpatient rehab setting. Physiatrist is providing close team supervision and 24 hour management of active medical problems listed below. Physiatrist and rehab team continue to assess barriers to discharge/monitor patient progress toward functional and medical goals  Care Tool:  Bathing    Body parts bathed by patient: Left arm, Chest, Abdomen, Front perineal area, Right upper leg, Left upper leg, Face, Right lower leg, Left lower leg   Body parts bathed by helper: Buttocks, Right arm     Bathing assist Assist Level: Minimal Assistance - Patient > 75%     Upper Body Dressing/Undressing Upper body dressing   What is the patient wearing?: Pull over shirt    Upper body assist Assist Level: Minimal Assistance - Patient > 75%    Lower Body Dressing/Undressing Lower body dressing      What is the patient wearing?: Incontinence brief, Pants     Lower body assist Assist for lower body dressing: Moderate Assistance - Patient 50 - 74%     Toileting Toileting Toileting Activity did not occur (Clothing management and hygiene only): N/A (no void or bm)  Toileting assist Assist for toileting: Moderate  Assistance - Patient 50 - 74%     Transfers Chair/bed transfer  Transfers assist  Chair/bed transfer activity did not occur: Safety/medical concerns  Chair/bed transfer assist level: Minimal Assistance - Patient > 75%     Locomotion Ambulation   Ambulation assist   Ambulation activity did not occur: Safety/medical concerns          Walk 10 feet activity   Assist  Walk 10 feet activity did not occur: Safety/medical concerns        Walk 50 feet activity   Assist Walk 50 feet with 2 turns activity did not occur: Safety/medical concerns         Walk 150 feet  activity   Assist Walk 150 feet activity did not occur: Safety/medical concerns         Walk 10 feet on uneven surface  activity   Assist Walk 10 feet on uneven surfaces activity did not occur: Safety/medical concerns         Wheelchair     Assist Is the patient using a wheelchair?: Yes Type of Wheelchair: Manual Wheelchair activity did not occur: Safety/medical concerns         Wheelchair 50 feet with 2 turns activity    Assist    Wheelchair 50 feet with 2 turns activity did not occur: Safety/medical concerns       Wheelchair 150 feet activity     Assist  Wheelchair 150 feet activity did not occur: Safety/medical concerns       Blood pressure (!) 143/98, pulse 69, temperature 97.9 F (36.6 C), temperature source Oral, resp. rate 17, height 4\' 11"  (1.499 m), weight 69.3 kg, last menstrual period 06/22/2022, SpO2 100 %.    Medical Problem List and Plan: 1. 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  Antithrombotics: -DVT/anticoagulation:  Pharmaceutical: Eliquis 5mg  BID             -antiplatelet therapy: N/A 3.Back pain/Pain Management:  Continue magnesium 250mg  HS. Tylenol prn. Decrease MSIR 15mg  to q8H prn  4. Chronic anxiety: Resumed home atarax 10mg  TID             -continue lexapro 5mg  qhs             -neuropsych eval -10/02/22 started Trazodone 25mg  QHS and Hydroxyzine 25mg  QHS for now, monitor for effect or oversedation; left Hydroxyzine 10mg  TID PRN dosing for anxiety in place  5. Neuropsych/cognition: This patient is not capable of making decisions on her own behalf. 6. Skin/Wound Care:  Routine pressure relief  measures.  7. Fluids/Electrolytes/Nutrition: Monitor I/O. Monitor routine labs weekly, next 10/10/22 8. Acute on chronic  combined CHF: HH diet.  -Monitor weights daily and for signs of overload. Asked nursing to weight her today -continue Torsemide 40mg  QD (>>10mg  QD), Losartan 50mg  QD, Spironolactone 25mg  QD, Jardiance 10mg  QD (>>25mg  QD), and Metoprolol 25mg  BID  -Weight reviewed 3/15 and is significantly downtrending, asked RN to reweigh and discussed with RN what patient has been eating, Monitor weights daily.  Increase torsemide back to 20mg  daily Filed Weights   10/19/22 0500 10/20/22 0550 10/21/22 0604  Weight:  68.2 kg 69.2 kg 69.3 kg   Vitals:   10/17/22 1401 10/17/22 1939 10/18/22 0258 10/18/22 1401  BP: 125/89 (!) 130/94 (!) 133/96 120/89   10/18/22 1942 10/19/22 0549 10/19/22 1409 10/19/22 2123  BP: 115/83 119/81 123/89 (!) 140/95   10/20/22 0548 10/20/22 1239 10/20/22 2046 10/21/22 0604  BP: 127/89 117/75 (!) 121/98 (!) 143/98    9. T2DM: Hgb A1C-10.8  (improving >15 @ 11/23). Was on Lantus 15u w/ novolog 12 units with supper>>not restarted             --monitor BS ac/hs and use SSI for elevated BS             --on SSI, Jardiance 10mg  QD -10/02/22 CBGs well controlled with 2-3U of novolog use per meal so far, monitor for trends  -3/13 increase Jardiance to 25mg   -d/c ISS, discussed good blood sugar control with patient  Add magnesium gluconate 250mg  HS CBG (last 3)  Recent Labs    10/20/22 1639 10/20/22 2047 10/21/22 0629  GLUCAP 131* 192* 127*    10. Severe obesity: Educate patient on diet, compliance and exercise to help promote health and mobility. Add magnesium gluconate 250mg  HS 11. Acute on Chronic renal failure/AKI: Diuretic held. Cr improved 3/13 but worsened 3/18. Decrease torsemide to 10mg . Monitor weekly. Discussed with patient. Weight decreased, decrease torsemide to 10mg .  12. Hyperlipidemia: continue lipitor 80mg  QHS  13. Decreased initiation: increase amantadine to 200mg  daily, continue, appears to be helping, discussed improvements with therapy 14. Vitamin D  insufficiency: 21.41 on 10/04/22, continue ergocalciferol 50,000U once per week for 7 weeks. Daily supplement d/ced 15. Bilateral lower extremity throbbing: -10/09/22 SCDs need adjusting, asked nursing to assist; added Robaxin 500mg  q6h PRN for leg spasms [place] reviewed and negative for clots, asking nursing to check whether SCDs are working, continue SCDs, asked nursing to apply them Discussed baclofen but she defers due to side effect of constipation.  16. Constipation: Add colace 100mg  QD; monitor for effect.  messaged nursing regarding when last BM was Magnesium citrate ordered 3/27 with good effect. Decrease Miralax to daily.  17. Insomnia: melatonin 5mg  started HS    LOS: 20 days A FACE TO FACE EVALUATION WAS PERFORMED  Martha Clan P Nihira Puello 10/21/2022, 9:18 AM

## 2022-10-22 LAB — GLUCOSE, CAPILLARY
Glucose-Capillary: 160 mg/dL — ABNORMAL HIGH (ref 70–99)
Glucose-Capillary: 180 mg/dL — ABNORMAL HIGH (ref 70–99)
Glucose-Capillary: 195 mg/dL — ABNORMAL HIGH (ref 70–99)
Glucose-Capillary: 154 mg/dL — ABNORMAL HIGH (ref 70–99)

## 2022-10-22 NOTE — Progress Notes (Signed)
PROGRESS NOTE   Subjective/Complaints:  Pt doing well, slept ok last night. Denies pain. States her LBM was yesterday although documented as 10/19/22. Urinating well. Denies any other complaints or concerns today.   ROS: + back and neck pain-improved, +decreased initiation as per therapy- improved. +throbbing in b/l lower extremities-continues +Constipation-ongoing. Denies CP, SOB, abd pain, n/v/d.    Objective:   No results found. No results for input(s): "WBC", "HGB", "HCT", "PLT" in the last 72 hours.    No results for input(s): "NA", "K", "CL", "CO2", "GLUCOSE", "BUN", "CREATININE", "CALCIUM" in the last 72 hours.     Intake/Output Summary (Last 24 hours) at 10/22/2022 1102 Last data filed at 10/22/2022 1000 Gross per 24 hour  Intake 474 ml  Output 600 ml  Net -126 ml        Physical Exam: Vital Signs Blood pressure 105/80, pulse 66, temperature (!) 97.5 F (36.4 C), temperature source Oral, resp. rate 18, height 4\' 11"  (1.499 m), weight 69.1 kg, last menstrual period 06/22/2022, SpO2 97 %.  Constitutional: No distress . Vital signs reviewed. Obese. BMI 30.86 HEENT: NCAT, EOMI, oral membranes moist.  Neck: supple Cardiovascular: RRR without murmur. No JVD    Respiratory/Chest: CTA Bilaterally without wheezes or rales. Normal effort    GI/Abdomen: BS +, non-tender, non-distended Ext: no clubbing or cyanosis, no edema Psych: very flat, depressed, polite Skin: dry, warm MSK: weak in L arm, able to move R arm around in bed, full MsK exam not performed.  Neurologic: Pt less tired than before. Speech dysarthric, low volume. Language appears to be intact.    PRIOR EXAM: Neurologic: Right gaze preference but can follow to left to a lesser extent. No obvious field cut. Left central 7 and tongue deviation. LUE and LLE 0/5. MAS 1 throughout left side Senses pain and LT in all 4's. No resting tone. DTR's 1+ throughout.  Toes down left foot, no clonus. Improved balance   Assessment/Plan: 1. Functional deficits which require 3+ hours per day of interdisciplinary therapy in a comprehensive inpatient rehab setting. Physiatrist is providing close team supervision and 24 hour management of active medical problems listed below. Physiatrist and rehab team continue to assess barriers to discharge/monitor patient progress toward functional and medical goals  Care Tool:  Bathing    Body parts bathed by patient: Left arm, Chest, Abdomen, Front perineal area, Right upper leg, Left upper leg, Face, Right lower leg, Left lower leg   Body parts bathed by helper: Buttocks, Right arm     Bathing assist Assist Level: Minimal Assistance - Patient > 75%     Upper Body Dressing/Undressing Upper body dressing   What is the patient wearing?: Pull over shirt    Upper body assist Assist Level: Minimal Assistance - Patient > 75%    Lower Body Dressing/Undressing Lower body dressing      What is the patient wearing?: Incontinence brief, Pants     Lower body assist Assist for lower body dressing: Moderate Assistance - Patient 50 - 74%     Toileting Toileting Toileting Activity did not occur (Clothing management and hygiene only): N/A (no void or bm)  Toileting assist Assist for toileting:  Moderate Assistance - Patient 50 - 74%     Transfers Chair/bed transfer  Transfers assist  Chair/bed transfer activity did not occur: Safety/medical concerns  Chair/bed transfer assist level: Minimal Assistance - Patient > 75%     Locomotion Ambulation   Ambulation assist   Ambulation activity did not occur: Safety/medical concerns          Walk 10 feet activity   Assist  Walk 10 feet activity did not occur: Safety/medical concerns        Walk 50 feet activity   Assist Walk 50 feet with 2 turns activity did not occur: Safety/medical concerns         Walk 150 feet activity   Assist Walk 150  feet activity did not occur: Safety/medical concerns         Walk 10 feet on uneven surface  activity   Assist Walk 10 feet on uneven surfaces activity did not occur: Safety/medical concerns         Wheelchair     Assist Is the patient using a wheelchair?: Yes Type of Wheelchair: Manual Wheelchair activity did not occur: Safety/medical concerns         Wheelchair 50 feet with 2 turns activity    Assist    Wheelchair 50 feet with 2 turns activity did not occur: Safety/medical concerns       Wheelchair 150 feet activity     Assist  Wheelchair 150 feet activity did not occur: Safety/medical concerns       Blood pressure 105/80, pulse 66, temperature (!) 97.5 F (36.4 C), temperature source Oral, resp. rate 18, height 4\' 11"  (1.499 m), weight 69.1 kg, last menstrual period 06/22/2022, SpO2 97 %.    Medical Problem List and Plan: 1. 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  Antithrombotics: -DVT/anticoagulation:  Pharmaceutical: Eliquis 5mg  BID             -antiplatelet therapy: N/A 3. Back pain/Pain Management:  Continue magnesium 250mg  HS (>>500mg  QHS). Tylenol prn. Decrease MSIR 15mg  to q8H prn  4. Chronic anxiety, Insomnia: Resumed home atarax 10mg  TID PRN             -continue lexapro 5mg  qhs             -neuropsych eval -10/02/22 started Trazodone 25mg  QHS and Hydroxyzine 25mg  QHS for now, monitor for effect or oversedation; left Hydroxyzine 10mg  TID PRN dosing for anxiety in place  -10/08/22 added Melatonin 5mg  QHS -10/22/22 sleeping well, cont regimen 5. Neuropsych/cognition: This patient is not capable of making decisions on her own behalf. 6. Skin/Wound Care:  Routine pressure relief  measures.  7. Fluids/Electrolytes/Nutrition: Monitor  I/O. Monitor routine labs weekly, next 10/24/22 8. Acute on chronic combined CHF: HH diet.  -Monitor weights daily and for signs of overload -continue Torsemide 40mg  QD (>>20mg  QD), Losartan 50mg  QD, Spironolactone 25mg  QD, Jardiance 10mg  QD (>>25mg  QD), and Metoprolol 25mg  BID  -Weight reviewed 3/15 and is significantly downtrending, asked RN to reweigh and discussed with RN what patient has been eating, Monitor weights daily.  -Increase torsemide back to 20mg  daily -10/22/22 BP  and wt stable, cont regimen Filed Weights   10/20/22 0550 10/21/22 0604 10/22/22 0514  Weight: 69.2 kg 69.3 kg 69.1 kg   Vitals:   10/18/22 1401 10/18/22 1942 10/19/22 0549 10/19/22 1409  BP: 120/89 115/83 119/81 123/89   10/19/22 2123 10/20/22 0548 10/20/22 1239 10/20/22 2046  BP: (!) 140/95 127/89 117/75 (!) 121/98   10/21/22 0604 10/21/22 1306 10/21/22 2053 10/22/22 0407  BP: (!) 143/98 110/78 126/89 105/80    9. T2DM: Hgb A1C-10.8  (improving >15 @ 11/23). Was on Lantus 15u w/ novolog 12 units with supper>>not restarted             --monitor BS ac/hs and use SSI for elevated BS             --on SSI, Jardiance 10mg  QD (>>25mg  QD) -10/02/22 CBGs well controlled with 2-3U of novolog use per meal so far, monitor for trends  -3/13 increase Jardiance to 25mg   -d/c ISS, discussed good blood sugar control with patient  -Add magnesium gluconate 250mg  HS>> increased to 500mg  QHS  -10/22/22 CBGs adequate, cont regimen CBG (last 3)  Recent Labs    10/21/22 1636 10/21/22 2055 10/22/22 0634  GLUCAP 179* 159* 160*    10. Severe obesity: Educate patient on diet, compliance and exercise to help promote health and mobility. Add magnesium gluconate 250mg  HS, increased to 500mg  QHS 11. Acute on Chronic renal failure/AKI: Diuretic held. Cr improved 3/13 but worsened 3/18. Decrease torsemide to 10mg . Monitor weekly. Discussed with patient. Weight decreased, decrease torsemide to 10mg , then increased to 20mg  QD.  12.  Hyperlipidemia: continue lipitor 80mg  QHS  13. Decreased initiation: increase amantadine to 200mg  daily, continue, appears to be helping, discussed improvements with therapy 14. Vitamin D insufficiency: 21.41 on 10/04/22, continue ergocalciferol 50,000U once per week for 7 weeks. Daily supplement d/ced 15. Bilateral lower extremity throbbing: -10/09/22 SCDs need adjusting, asked nursing to assist; added Robaxin 500mg  q6h PRN for leg spasms -[place] reviewed and negative for clots, asking nursing to check whether SCDs are working, continue SCDs, asked nursing to apply them -Discussed baclofen but she defers due to side effect of constipation.  -10/22/22 no complaints today, cont regimen 16. Constipation:   -10/08/22 increased miralax 17g BID, add colace 100mg  QD  -10/18/22 senokot 2 tabs QD  added, plus 2 tabs QD PRN if no BM in 24hrs -Magnesium citrate ordered 3/27 with good effect. Decrease Miralax to daily.  -10/22/22 pt reports LBM yesterday but not documented since 10/19/22; monitor for now, but may need to go back up on Miralax     LOS: 21 days A FACE TO [place] 10/22/2022, 11:02 AM

## 2022-10-23 DIAGNOSIS — M25562 Pain in left knee: Secondary | ICD-10-CM

## 2022-10-23 LAB — GLUCOSE, CAPILLARY
Glucose-Capillary: 120 mg/dL — ABNORMAL HIGH (ref 70–99)
Glucose-Capillary: 209 mg/dL — ABNORMAL HIGH (ref 70–99)
Glucose-Capillary: 225 mg/dL — ABNORMAL HIGH (ref 70–99)
Glucose-Capillary: 222 mg/dL — ABNORMAL HIGH (ref 70–99)

## 2022-10-23 NOTE — Progress Notes (Signed)
PROGRESS NOTE   Subjective/Complaints:  Pt doing well again today, but slept poorly last night due to interruptions. Reports ongoing LLE pain-sharp shooting pain throughout the extremity, no focal area. States her LBM was yesterday. Urinating well. Denies any other complaints or concerns today.   ROS: + back and neck pain-improved, +decreased initiation as per therapy- improved. +throbbing in b/l lower extremities-continues, +Constipation-ongoing. Denies CP, SOB, abd pain, n/v/d.    Objective:   No results found. No results for input(s): "WBC", "HGB", "HCT", "PLT" in the last 72 hours.    No results for input(s): "NA", "K", "CL", "CO2", "GLUCOSE", "BUN", "CREATININE", "CALCIUM" in the last 72 hours.     Intake/Output Summary (Last 24 hours) at 10/23/2022 1040 Last data filed at 10/23/2022 0916 Gross per 24 hour  Intake 714 ml  Output 1200 ml  Net -486 ml        Physical Exam: Vital Signs Blood pressure 112/81, pulse 67, temperature 98.6 F (37 C), temperature source Oral, resp. rate 18, height 4\' 11"  (1.499 m), weight 64.6 kg, last menstrual period 06/22/2022, SpO2 99 %.  Constitutional: No distress . Vital signs reviewed. Obese. BMI 30.86 HEENT: NCAT, EOMI, oral membranes moist.  Neck: supple Cardiovascular: RRR without murmur. No JVD    Respiratory/Chest: CTA Bilaterally without wheezes or rales. Normal effort    GI/Abdomen: BS +, non-tender, non-distended Ext: no clubbing or cyanosis, no edema, no focal area of tenderness Psych: very flat, depressed, more irritable today but still polite Skin: dry, warm MSK: weak in L arm and LLE, able to move R arm and RLE around in bed, full MsK exam not performed.  Neurologic: Pt less tired than before. Speech dysarthric, low volume. Language appears to be intact.    PRIOR EXAM: Neurologic: Right gaze preference but can follow to left to a lesser extent. No obvious field  cut. Left central 7 and tongue deviation. LUE and LLE 0/5. MAS 1 throughout left side Senses pain and LT in all 4's. No resting tone. DTR's 1+ throughout. Toes down left foot, no clonus. Improved balance   Assessment/Plan: 1. Functional deficits which require 3+ hours per day of interdisciplinary therapy in a comprehensive inpatient rehab setting. Physiatrist is providing close team supervision and 24 hour management of active medical problems listed below. Physiatrist and rehab team continue to assess barriers to discharge/monitor patient progress toward functional and medical goals  Care Tool:  Bathing    Body parts bathed by patient: Left arm, Chest, Abdomen, Front perineal area, Right upper leg, Left upper leg, Face, Right lower leg, Left lower leg   Body parts bathed by helper: Buttocks, Right arm     Bathing assist Assist Level: Minimal Assistance - Patient > 75%     Upper Body Dressing/Undressing Upper body dressing   What is the patient wearing?: Pull over shirt    Upper body assist Assist Level: Minimal Assistance - Patient > 75%    Lower Body Dressing/Undressing Lower body dressing      What is the patient wearing?: Incontinence brief, Pants     Lower body assist Assist for lower body dressing: Moderate Assistance - Patient 50 - 74%  Toileting Toileting Toileting Activity did not occur Landscape architect and hygiene only): N/A (no void or bm)  Toileting assist Assist for toileting: Moderate Assistance - Patient 50 - 74%     Transfers Chair/bed transfer  Transfers assist  Chair/bed transfer activity did not occur: Safety/medical concerns  Chair/bed transfer assist level: Minimal Assistance - Patient > 75%     Locomotion Ambulation   Ambulation assist   Ambulation activity did not occur: Safety/medical concerns          Walk 10 feet activity   Assist  Walk 10 feet activity did not occur: Safety/medical concerns        Walk 50 feet  activity   Assist Walk 50 feet with 2 turns activity did not occur: Safety/medical concerns         Walk 150 feet activity   Assist Walk 150 feet activity did not occur: Safety/medical concerns         Walk 10 feet on uneven surface  activity   Assist Walk 10 feet on uneven surfaces activity did not occur: Safety/medical concerns         Wheelchair     Assist Is the patient using a wheelchair?: Yes Type of Wheelchair: Manual Wheelchair activity did not occur: Safety/medical concerns         Wheelchair 50 feet with 2 turns activity    Assist    Wheelchair 50 feet with 2 turns activity did not occur: Safety/medical concerns       Wheelchair 150 feet activity     Assist  Wheelchair 150 feet activity did not occur: Safety/medical concerns       Blood pressure 112/81, pulse 67, temperature 98.6 F (37 C), temperature source Oral, resp. rate 18, height 4\' 11"  (1.499 m), weight 64.6 kg, last menstrual period 06/22/2022, SpO2 99 %.    Medical Problem List and Plan: 1. 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  Antithrombotics: -DVT/anticoagulation:  Pharmaceutical: Eliquis 5mg  BID             -antiplatelet therapy: N/A 3. Back pain/Pain Management:  Continue magnesium 250mg  HS (>>500mg  QHS). Tylenol prn. Decrease MSIR 15mg  to q8H prn  4. Chronic anxiety, Insomnia: Resumed home atarax 10mg  TID PRN             -continue lexapro 5mg  qhs             -neuropsych eval -10/02/22 started Trazodone 25mg  QHS and Hydroxyzine 25mg  QHS for now, monitor for effect or oversedation; left Hydroxyzine 10mg  TID PRN dosing for anxiety in place  -10/08/22 added Melatonin 5mg  QHS -10/23/22 sleeping disrupted last night, monitor for pattern 5. Neuropsych/cognition:  This patient is not capable of making decisions on her own behalf. 6. Skin/Wound Care:  Routine pressure relief  measures.  7. Fluids/Electrolytes/Nutrition: Monitor I/O. Monitor routine labs weekly, next 10/24/22 8. Acute on chronic combined CHF: HH diet.  -Monitor weights daily and for signs of overload -continue Torsemide 40mg  QD (>>20mg  QD), Losartan 50mg  QD, Spironolactone 25mg  QD, Jardiance 10mg  QD (>>25mg  QD), and Metoprolol 25mg  BID  -Weight reviewed 3/15 and is significantly downtrending,  asked RN to reweigh and discussed with RN what patient has been eating, Monitor weights daily.  -Increase torsemide back to 20mg  daily -3/30-31/24 BP and wt stable, cont regimen Filed Weights   10/21/22 0604 10/22/22 0514 10/23/22 0611  Weight: 69.3 kg 69.1 kg 64.6 kg   Vitals:   10/19/22 1409 10/19/22 2123 10/20/22 0548 10/20/22 1239  BP: 123/89 (!) 140/95 127/89 117/75   10/20/22 2046 10/21/22 0604 10/21/22 1306 10/21/22 2053  BP: (!) 121/98 (!) 143/98 110/78 126/89   10/22/22 0407 10/22/22 1314 10/22/22 1952 10/23/22 0603  BP: 105/80 124/81 (!) 122/94 112/81    9. T2DM: Hgb A1C-10.8  (improving >15 @ 11/23). Was on Lantus 15u w/ novolog 12 units with supper>>not restarted             --monitor BS ac/hs and use SSI for elevated BS             --on SSI, Jardiance 10mg  QD (>>25mg  QD) -10/02/22 CBGs well controlled with 2-3U of novolog use per meal so far, monitor for trends  -3/13 increase Jardiance to 25mg   -d/c ISS, discussed good blood sugar control with patient  -Add magnesium gluconate 250mg  HS>> increased to 500mg  QHS  -3/30-31/24 CBGs adequate, cont regimen CBG (last 3)  Recent Labs    10/22/22 1636 10/22/22 2057 10/23/22 0558  GLUCAP 195* 154* 120*    10. Severe obesity: Educate patient on diet, compliance and exercise to help promote health and mobility. Add magnesium gluconate 250mg  HS, increased to 500mg  QHS 11. Acute on Chronic renal failure/AKI: Diuretic held. Cr improved  3/13 but worsened 3/18. Decrease torsemide to 10mg . Monitor weekly. Discussed with patient. Weight decreased, decrease torsemide to 10mg , then increased to 20mg  QD.  12. Hyperlipidemia: continue lipitor 80mg  QHS  13. Decreased initiation: increase amantadine to 200mg  daily, continue, appears to be helping, discussed improvements with therapy 14. Vitamin D insufficiency: 21.41 on 10/04/22, continue ergocalciferol 50,000U once per week for 7 weeks. Daily supplement d/ced 15. Bilateral lower extremity throbbing: -10/09/22 SCDs need adjusting, asked nursing to assist; added Robaxin 500mg  q6h PRN for leg spasms -[place] reviewed and negative for clots, asking nursing to check whether SCDs are working, continue SCDs, asked nursing to apply them -Discussed baclofen but she defers due to side effect of constipation.  -10/23/22 now c/o LLE sharp pain, suspect post-CVA related pain, not utilizing PRN Robaxin, but also may benefit from Gabapentin if appropriate-- defer to weekday team.  16. Constipation:   -10/08/22 increased miralax 17g BID, add colace 100mg  QD  -10/18/22 senokot 2 tabs QD  added, plus 2 tabs QD PRN if no BM in 24hrs -Magnesium citrate ordered 3/27 with good effect. Decrease Miralax to daily.  -10/22/22 pt reports LBM yesterday but not documented since 10/19/22; monitor for now, but may need to go back up on Miralax -10/23/22 LBM yesterday, cont regimen     LOS: 22 days A FACE TO [place] 10/23/2022, 10:40 AM

## 2022-10-24 ENCOUNTER — Other Ambulatory Visit: Payer: Self-pay

## 2022-10-24 DIAGNOSIS — K5900 Constipation, unspecified: Secondary | ICD-10-CM

## 2022-10-24 LAB — CBC
HCT: 48.1 % — ABNORMAL HIGH (ref 36.0–46.0)
Hemoglobin: 14.6 g/dL (ref 12.0–15.0)
MCH: 21.9 pg — ABNORMAL LOW (ref 26.0–34.0)
MCHC: 30.4 g/dL (ref 30.0–36.0)
MCV: 72.2 fL — ABNORMAL LOW (ref 80.0–100.0)
Platelets: 265 10*3/uL (ref 150–400)
RBC: 6.66 MIL/uL — ABNORMAL HIGH (ref 3.87–5.11)
RDW: 22.4 % — ABNORMAL HIGH (ref 11.5–15.5)
WBC: 7.2 10*3/uL (ref 4.0–10.5)
nRBC: 0 % (ref 0.0–0.2)

## 2022-10-24 LAB — BASIC METABOLIC PANEL
Anion gap: 11 (ref 5–15)
BUN: 13 mg/dL (ref 6–20)
CO2: 28 mmol/L (ref 22–32)
Calcium: 9.3 mg/dL (ref 8.9–10.3)
Chloride: 95 mmol/L — ABNORMAL LOW (ref 98–111)
Creatinine, Ser: 1.19 mg/dL — ABNORMAL HIGH (ref 0.44–1.00)
GFR, Estimated: 58 mL/min — ABNORMAL LOW (ref >60)
Glucose, Bld: 224 mg/dL — ABNORMAL HIGH (ref 70–99)
Potassium: 3.8 mmol/L (ref 3.5–5.1)
Sodium: 134 mmol/L — ABNORMAL LOW (ref 135–145)

## 2022-10-24 LAB — GLUCOSE, CAPILLARY
Glucose-Capillary: 141 mg/dL — ABNORMAL HIGH (ref 70–99)
Glucose-Capillary: 267 mg/dL — ABNORMAL HIGH (ref 70–99)
Glucose-Capillary: 126 mg/dL — ABNORMAL HIGH (ref 70–99)
Glucose-Capillary: 180 mg/dL — ABNORMAL HIGH (ref 70–99)

## 2022-10-24 NOTE — Progress Notes (Signed)
PROGRESS NOTE   Subjective/Complaints:  Reports  constipation improved after BM this weekend, Last one I see documented was 3/30. No new concerns this AM.   ROS: + back and neck pain-improved, +decreased initiation as per therapy- improved. +throbbing in b/l lower extremities-continues, +Constipation. Denies CP, SOB, abd pain, n/v/d, New vision changes.    Objective:   No results found. No results for input(s): "WBC", "HGB", "HCT", "PLT" in the last 72 hours.    No results for input(s): "NA", "K", "CL", "CO2", "GLUCOSE", "BUN", "CREATININE", "CALCIUM" in the last 72 hours.     Intake/Output Summary (Last 24 hours) at 10/24/2022 0935 Last data filed at 10/24/2022 0700 Gross per 24 hour  Intake 238 ml  Output 525 ml  Net -287 ml         Physical Exam: Vital Signs Blood pressure 128/87, pulse 67, temperature 98.8 F (37.1 C), temperature source Oral, resp. rate 18, height 4\' 11"  (1.499 m), weight 64.5 kg, last menstrual period 06/22/2022, SpO2 100 %.  Constitutional: No distress . Vital signs reviewed. Obese. BMI 30.86. Lying in bed.  HEENT: NCAT, conjugate gaze, , oral membranes moist.  Neck: supple Cardiovascular: RRR without murmur. No JVD    Respiratory/Chest: CTA Bilaterally without wheezes or rales. Normal effort    GI/Abdomen: BS +, non-tender, non-distended Ext: no clubbing or cyanosis, no edema, no focal area of tenderness Psych: very flat, depressed, more irritable today but still polite Skin: dry, warm MSK: weak in L arm and LLE, able to move R arm and RLE around in bed, full MsK exam not performed.  Neurologic: Pt less tired than before. Speech dysarthric, low volume. Language appears to be intact.    PRIOR EXAM: Neurologic: Right gaze preference but can follow to left to a lesser extent. No obvious field cut. Left central 7 and tongue deviation. LUE and LLE 0/5. MAS 1 throughout left side Senses pain and  LT in all 4's. No resting tone. DTR's 1+ throughout. Toes down left foot, no clonus. Improved balance   Assessment/Plan: 1. Functional deficits which require 3+ hours per day of interdisciplinary therapy in a comprehensive inpatient rehab setting. Physiatrist is providing close team supervision and 24 hour management of active medical problems listed below. Physiatrist and rehab team continue to assess barriers to discharge/monitor patient progress toward functional and medical goals  Care Tool:  Bathing    Body parts bathed by patient: Left arm, Chest, Abdomen, Front perineal area, Right upper leg, Left upper leg, Face, Right lower leg, Left lower leg   Body parts bathed by helper: Buttocks, Right arm     Bathing assist Assist Level: Minimal Assistance - Patient > 75%     Upper Body Dressing/Undressing Upper body dressing   What is the patient wearing?: Pull over shirt    Upper body assist Assist Level: Minimal Assistance - Patient > 75%    Lower Body Dressing/Undressing Lower body dressing      What is the patient wearing?: Incontinence brief, Pants     Lower body assist Assist for lower body dressing: Moderate Assistance - Patient 50 - 74%     Toileting Toileting Toileting Activity did not occur (  Clothing management and hygiene only): N/A (no void or bm)  Toileting assist Assist for toileting: Moderate Assistance - Patient 50 - 74%     Transfers Chair/bed transfer  Transfers assist  Chair/bed transfer activity did not occur: Safety/medical concerns  Chair/bed transfer assist level: Minimal Assistance - Patient > 75%     Locomotion Ambulation   Ambulation assist   Ambulation activity did not occur: Safety/medical concerns          Walk 10 feet activity   Assist  Walk 10 feet activity did not occur: Safety/medical concerns        Walk 50 feet activity   Assist Walk 50 feet with 2 turns activity did not occur: Safety/medical concerns          Walk 150 feet activity   Assist Walk 150 feet activity did not occur: Safety/medical concerns         Walk 10 feet on uneven surface  activity   Assist Walk 10 feet on uneven surfaces activity did not occur: Safety/medical concerns         Wheelchair     Assist Is the patient using a wheelchair?: Yes Type of Wheelchair: Manual Wheelchair activity did not occur: Safety/medical concerns         Wheelchair 50 feet with 2 turns activity    Assist    Wheelchair 50 feet with 2 turns activity did not occur: Safety/medical concerns       Wheelchair 150 feet activity     Assist  Wheelchair 150 feet activity did not occur: Safety/medical concerns       Blood pressure 128/87, pulse 67, temperature 98.8 F (37.1 C), temperature source Oral, resp. rate 18, height 4\' 11"  (1.499 m), weight 64.5 kg, last menstrual period 06/22/2022, SpO2 100 %.    Medical Problem List and Plan: 1. 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  Antithrombotics: -DVT/anticoagulation:  Pharmaceutical: Eliquis 5mg  BID             -antiplatelet therapy: N/A 3. Back pain/Pain Management:  Continue magnesium 250mg  HS (>>500mg  QHS). Tylenol prn. Decrease MSIR 15mg  to q8H prn  4. Chronic anxiety, Insomnia: Resumed home atarax 10mg  TID PRN             -continue lexapro 5mg  qhs             -neuropsych eval -10/02/22 started Trazodone 25mg  QHS and Hydroxyzine 25mg  QHS for now, monitor for effect or oversedation; left Hydroxyzine 10mg  TID PRN dosing for anxiety in place  -10/08/22 added Melatonin 5mg  QHS -10/23/22 sleeping disrupted last night, monitor for pattern 5. Neuropsych/cognition: This patient is not capable of making decisions on her own behalf. 6. Skin/Wound Care:  Routine  pressure relief  measures.  7. Fluids/Electrolytes/Nutrition: Monitor I/O. Monitor routine labs weekly, next 10/24/22 8. Acute on chronic combined CHF: HH diet.  -Monitor weights daily and for signs of overload -continue Torsemide 40mg  QD (>>20mg  QD), Losartan 50mg  QD, Spironolactone 25mg  QD, Jardiance 10mg  QD (>>25mg  QD), and Metoprolol 25mg  BID  -Weight reviewed 3/15 and is significantly downtrending, asked RN to reweigh and discussed with  RN what patient has been eating, Monitor weights daily.  -Increase torsemide back to 20mg  daily -4/1 BP well controlled, continue to monitor Filed Weights   10/22/22 0514 10/23/22 0611 10/24/22 0601  Weight: 69.1 kg 64.6 kg 64.5 kg   Vitals:   10/20/22 1239 10/20/22 2046 10/21/22 0604 10/21/22 1306  BP: 117/75 (!) 121/98 (!) 143/98 110/78   10/21/22 2053 10/22/22 0407 10/22/22 1314 10/22/22 1952  BP: 126/89 105/80 124/81 (!) 122/94   10/23/22 0603 10/23/22 1331 10/23/22 1932 10/24/22 0601  BP: 112/81 113/83 111/78 128/87    9. T2DM: Hgb A1C-10.8  (improving >15 @ 11/23). Was on Lantus 15u w/ novolog 12 units with supper>>not restarted             --monitor BS ac/hs and use SSI for elevated BS             --on SSI, Jardiance 10mg  QD (>>25mg  QD) -10/02/22 CBGs well controlled with 2-3U of novolog use per meal so far, monitor for trends  -3/13 increase Jardiance to 25mg   -d/c ISS, discussed good blood sugar control with patient  -Add magnesium gluconate 250mg  HS>> increased to 500mg  QHS  -3/30-31/24 CBGs adequate, cont regimen CBG (last 3)  Recent Labs    10/23/22 1641 10/23/22 2052 10/24/22 0558  GLUCAP 225* 222* 141*     10. Severe obesity: Educate patient on diet, compliance and exercise to help promote health and mobility. Add magnesium gluconate 250mg  HS, increased to 500mg  QHS 11. Acute on Chronic renal failure/AKI: Diuretic held. Cr improved 3/13 but worsened 3/18. Decrease torsemide to 10mg . Monitor weekly. Discussed with patient. Weight  decreased, decrease torsemide to 10mg , then increased to 20mg  QD.   Filed Weights   10/22/22 0514 10/23/22 0611 10/24/22 0601  Weight: 69.1 kg 64.6 kg 64.5 kg    12. Hyperlipidemia: continue lipitor 80mg  QHS  13. Decreased initiation: increase amantadine to 200mg  daily, continue, appears to be helping, discussed improvements with therapy 14. Vitamin D insufficiency: 21.41 on 10/04/22, continue ergocalciferol 50,000U once per week for 7 weeks. Daily supplement d/ced 15. Bilateral lower extremity throbbing: -10/09/22 SCDs need adjusting, asked nursing to assist; added Robaxin 500mg  q6h PRN for leg spasms -[place] reviewed and negative for clots, asking nursing to check whether SCDs are working, continue SCDs, asked nursing to apply them -Discussed baclofen but she defers due to side effect of constipation.  -10/23/22 now c/o LLE sharp pain, suspect post-CVA related pain, not utilizing PRN Robaxin, but also may benefit from Gabapentin if appropriate-- defer to weekday team.  16. Constipation:   -10/08/22 increased miralax 17g BID, add colace 100mg  QD  -10/18/22 senokot 2 tabs QD  added, plus 2 tabs QD PRN if no BM in 24hrs -Magnesium citrate ordered 3/27 with good effect. Decrease Miralax to daily.  -10/22/22 pt reports LBM yesterday but not documented since 10/19/22; monitor for now, but may need to go back up on Miralax -10/23/22 LBM yesterday, cont regimen  -4/1 Continue Miralax, consider additional medication if no Bms today    LOS: 23 days A FACE TO FACE EVALUATION WAS PERFORMED  Jennye Boroughs 10/24/2022, 9:35 AM

## 2022-10-24 NOTE — Progress Notes (Signed)
Physical Therapy Session Note  Patient Details  Name: [name] MRN: [id number] Date of Birth: [birth year]  Today's Date: 10/24/2022 PT Individual Time: 1133-1205 PT Individual Time Calculation (min): 32 min   Short Term Goals: Week 1:  PT Short Term Goals - Week 1 PT Short Term Goal 1 (Week 1): Pt will roll side to side w/ min A. PT Short Term Goal 1 - Progress (Week 1): Progressing toward goal PT Short Term Goal 2 (Week 1): Pt will transfer sup to sit w/ mod A PT Short Term Goal 2 - Progress (Week 1): Progressing toward goal PT Short Term Goal 3 (Week 1): Pt will maintain sitting balance x 5' w/ min A PT Short Term Goal 3 - Progress (Week 1): Met PT Short Term Goal 4 (Week 1): PT to assess gait. PT Short Term Goal 4 - Progress (Week 1): Not met PT Short Term Goals - Week 2 PT Short Term Goal 1 (Week 2): Pt will complete bed mobility with modA PT Short Term Goal 1 - Progress (Week 2): Met PT Short Term Goal 2 (Week 2): Pt will complete bed<>chair transfers with modA and LRAD PT Short Term Goal 2 - Progress (Week 2): Met PT Short Term Goal 3 (Week 2): Pt will ambulate 65ft with maxA and LRAD PT Short Term Goal 3 - Progress (Week 2): Met PT Short Term Goals - Week 3 PT Short Term Goal 1 (Week 3): STG = LTG due to ELOS  Skilled Therapeutic Interventions/Progress Updates:  Patient *** on entrance to room. Patient alert and agreeable to PT session.   Patient with no pain complaint at start of session.  Therapeutic Activity:  Wheelchair Mobility:  Pt propelled wheelchair *** feet with ***. Provided vc/ tc for ***.   Patient *** at end of session with brakes locked, *** alarm set, and all needs within reach.   Therapy Documentation Precautions:  Precautions Precautions: Fall Precaution Comments: Left hemiparesis Required Braces or Orthoses: Other Brace Other Brace: L Resting hand splint and L PRAFO Restrictions Weight Bearing Restrictions: No General:   Vital  Signs:   Pain: Pain Assessment Pain Scale: Faces Faces Pain Scale: No hurt  Therapy/Group: Individual Therapy  [name] PT, DPT, CSRS 10/24/2022, 1:03 PM

## 2022-10-24 NOTE — Progress Notes (Signed)
Physical Therapy Session Note  Patient Details  Name: [name] MRN: [id number] Date of Birth: 03-26-1976  Today's Date: 10/24/2022 PT Individual Time: 0800-0843 PT Individual Time Calculation (min): 43 min   Short Term Goals: Week 3:  PT Short Term Goal 1 (Week 3): STG = LTG due to ELOS  Skilled Therapeutic Interventions/Progress Updates:       Pt presents supine in bed finishing up her breakfast - denies pain and agreeable PT tx. Noted patient wearing her L WHO but not her PRAFO - sign in room to reinforce and educated patient on benefits of bracing.  Supine<>sitting EOB with minA for LLE management - HOB raised and patient relying on bed rail to assist herself in pulling herself to sitting EOB. Pt requesting to use bathroom. Sit<>Stand in [place] with CGA from EOB height. Dependent transfer in [place] to toilet and patient continent of bladder - charted. New clean brief donned with totalA.  UB and LB dressing completed with totalA (pt <25%) for time management. Educated during dressing on hemi technique as patient lacked recall of technique. Donned tennis shoes and L GRAFO with dependent assist. Spent the rest of the time remaining on stretching her LLE for heel cord and hamstring from w/c level. Patient with limited tolerance to static stretching holds 2/2 tone - would benefit from caregiver ed on this. Concluded session seated in w/c with 1/2 lap tray on, call bell in reach, made aware of upcoming therapy schedule.    Therapy Documentation Precautions:  Precautions Precautions: Fall Precaution Comments: Left hemiparesis Required Braces or Orthoses: Other Brace Other Brace: L Resting hand splint and L PRAFO Restrictions Weight Bearing Restrictions: No General:     Therapy/Group: Individual Therapy  [name] 10/24/2022, 7:37 AM

## 2022-10-24 NOTE — Progress Notes (Signed)
Occupational Therapy Session Note  Patient Details  Name: [name] MRN: [id number] Date of Birth: [birth year]  Today's Date: 10/24/2022 OT Individual Time: [id number] Individual Time Calculation (min): 56 min    Short Term Goals: Week 3:  OT Short Term Goal 1 (Week 3): Patient will don pull over shirt with set up assistance OT Short Term Goal 2 (Week 3): Patient will don pants with min assist OT Short Term Goal 3 (Week 3): Patient will complete toileting with min assist OT Short Term Goal 4 (Week 3): Patient will shower with min assist  Skilled Therapeutic Interventions/Progress Updates:    Patient received seated in wheelchair, fully dressed.  Working on discharge planning.  Patient unsure where she will stay post discharge. Has option to stay with dad or daughter.  Initially indicated she would split time between the two.  Patient encouraged to choose which family she will stay with as primary site as she will be needing specialized equipment - eg hospital bed and bathroom equipment.  Patient reports feeling overwhelmed.  Have again encouraged her to consider neuropsych and she continues to decline.   Practiced tub shower transfer with tub transfer bench - patient needs mod assist.  Patient left up in wheelchair - brother here visiting.  Call bell and personal items in reach.    Therapy Documentation Precautions:  Precautions Precautions: Fall Precaution Comments: Left hemiparesis Required Braces or Orthoses: Other Brace Other Brace: L Resting hand splint and L PRAFO Restrictions Weight Bearing Restrictions: No   Pain:  Denies pain    Therapy/Group: Individual Therapy  Mariah Milling 10/24/2022, 10:12 AM

## 2022-10-24 NOTE — Progress Notes (Signed)
Speech Language Pathology Daily Session Note  Patient Details  Name: [name] MRN: [id number] Date of Birth: [birth year]  Today's Date: 10/24/2022 SLP Individual Time: 1030-1130 SLP Individual Time Calculation (min): 60 min  Short Term Goals: Week 3: SLP Short Term Goal 1 (Week 3): Patient will complete medication management tasks with min-to-mod A verbal cues to achieve 70% accuracy SLP Short Term Goal 2 (Week 3): Patient will complete problem solving tasks with awareness of errors given min-to-mod A verbal cues to ID and repair SLP Short Term Goal 3 (Week 3): Pt will trial regular textures with functional pharyngeal and no overt s/sx concering for airway intrusion with Mod I for implementation of safe swallowing strategies. SLP Short Term Goal 4 (Week 3): Pt will recall novel, functional information 15 minute delay with 80% acc with mod verbal and visual cues  Skilled Therapeutic Interventions: Skilled intervention focused on cognition and tolerance of diet. Pt seen with regular snack crackers. She demonstrated prolonged mastication and decreased oral clearance. She complained that crackers were "too dry" and needed liquid wash to clear contents from oral cavity. She recalled medications from medication list and was able to answer problem solving questions with mod A. She continues to require increased time and direction with processing information. Pt only required set up assistance with snack. Pt changed from full supervision to intermittent with set up assistance. Cont therapy per plan of care.      Pain Pain Assessment Pain Scale: Faces Faces Pain Scale: No hurt  Therapy/Group: Individual Therapy  Darrol Poke Gwenith Tschida 10/24/2022, 11:25 AM

## 2022-10-25 DIAGNOSIS — E1165 Type 2 diabetes mellitus with hyperglycemia: Secondary | ICD-10-CM

## 2022-10-25 DIAGNOSIS — I5042 Chronic combined systolic (congestive) and diastolic (congestive) heart failure: Secondary | ICD-10-CM

## 2022-10-25 LAB — GLUCOSE, CAPILLARY
Glucose-Capillary: 174 mg/dL — ABNORMAL HIGH (ref 70–99)
Glucose-Capillary: 229 mg/dL — ABNORMAL HIGH (ref 70–99)
Glucose-Capillary: 170 mg/dL — ABNORMAL HIGH (ref 70–99)
Glucose-Capillary: 186 mg/dL — ABNORMAL HIGH (ref 70–99)

## 2022-10-25 MED ORDER — GLIPIZIDE 5 MG PO TABS
2.5000 mg | ORAL_TABLET | Freq: Every day | ORAL | Status: DC
  Administered 2022-10-25 – 2022-10-27 (×3): 2.5 mg via ORAL
  Filled 2022-10-25: qty 0.5
  Filled 2022-10-25 (×2): qty 1
  Filled 2022-10-25 (×2): qty 0.5

## 2022-10-25 NOTE — Progress Notes (Signed)
Physical Therapy Session Note  Patient Details  Name: [name] MRN: [id number] Date of Birth: 1976-01-16  Today's Date: 10/25/2022 PT Individual Time: 1406-1506 PT Individual Time Calculation (min): 60 min   Short Term Goals: Week 3:  PT Short Term Goal 1 (Week 3): STG = LTG due to ELOS  Skilled Therapeutic Interventions/Progress Updates:    Pt received sitting in w/c with her family present and pt agreeable to therapy session. Pt reporting 9/10 lower thoracic level back pain - nurse notified and present for medication administration at beginning of session - pt reports back pain improves to 7/10 during session.  Pt already wearing L LE Matrix Max GRAFO.  Sit<>stands using R UE support on w/c armrest with light min assist progressing towards CGA during session as pt compensates with heavy R LE weight shift .   Gait training 31ft + 65ft +9ft using R HHA from +2 with mod assist from therapist for balance and L LE gait mechanics. Pt demonstrating the following gait deviations with therapist providing the described cuing and facilitation for improvement:  - pt primarily performing step-to pattern leading with L LE - requires total assist to advance L LE during swing phase due to lack of hip flexor activation, even with toe-cover donned, although slightly improves during 2nd walk with pt having greater initiation of swing - requires min/mod assist for L stance phase control with facilitation for increased hip and knee extension, pt has quad activation - therapist facilitating weight shifting onto stance limb throughout  After 1st walk, pt becomes emotional and tearful stating "I can't live like this" becoming sad about her CLOF. Therapist provides emotional support and education on her progress thus far and anticipated continued gains if she continues to challenge herself during therapy. Therapist also suggested peer support while in CIR and pt in agreement to this plan - notified SW and  care team to coordinate a phone call with an appropriate peer.  Pt reports increasing back pain during 3rd walk; therefore, stopped and discontinued additional gait training this session. Pt also reporting significant fatigue, but with max encouragement is agreeable to continue with therapy session.  Stand pivot w/c<>EOM using R UE support on therapist with heavy min assist - pt requires increased assistance when transferring towards L as pt requires total assist manual facilitation to step and reposition L LE during transfer.  Sit<>supine on mat with min assist for L hemibody management when going to lie down and then also for trunk support when coming to sitting.  Performed supine bridging with L LE bias for NMR 2x10-15 reps with multimodal cuing for increased L glute activation and pt demos good hip clearance reaching full extension until becoming fatigued.  Transported back to room and pt reports need to use bathroom. Stand pivot w/c<>toilet with heavy min assist as described above. Standing with min assist while +2 provided dependent LB clothing management for time management. Continent of bladder - seated pericare set-up assist. R stand pivot to EOB heavy min assist. Pt left seated EOB in care of NT.   Therapy Documentation Precautions:  Precautions Precautions: Fall Precaution Comments: Left hemiparesis Required Braces or Orthoses: Other Brace Other Brace: L Resting hand splint and L PRAFO Restrictions Weight Bearing Restrictions: No   Pain: Reports lower thoracic spine pain - details above - nurse provided medication for pain relief.    Therapy/Group: Individual Therapy  Tawana Scale , PT, DPT, NCS, CSRS 10/25/2022, 1:04 PM

## 2022-10-25 NOTE — Progress Notes (Signed)
Speech Language Pathology Weekly Progress and Session Note  Patient Details  Name: [name] MRN: [id number] Date of Birth: September 12, 1975  Beginning of progress report period: October 18, 2022 End of progress report period: October 25, 2022  Today's Date: 10/25/2022 SLP Individual Time: 1015-1100 SLP Individual Time Calculation (min): 45 min  Short Term Goals: Week 3: SLP Short Term Goal 1 (Week 3): Patient will complete medication management tasks with min-to-mod A verbal cues to achieve 70% accuracy SLP Short Term Goal 1 - Progress (Week 3): Progressing toward goal SLP Short Term Goal 2 (Week 3): Patient will complete problem solving tasks with awareness of errors given min-to-mod A verbal cues to ID and repair SLP Short Term Goal 2 - Progress (Week 3): Progressing toward goal SLP Short Term Goal 3 (Week 3): Pt will trial regular textures with functional pharyngeal and no overt s/sx concering for airway intrusion with Mod I for implementation of safe swallowing strategies. SLP Short Term Goal 3 - Progress (Week 3): Progressing toward goal SLP Short Term Goal 4 (Week 3): Pt will recall novel, functional information 15 minute delay with 80% acc with mod verbal and visual cues SLP Short Term Goal 4 - Progress (Week 3): Progressing toward goal    New Short Term Goals: Week 4: SLP Short Term Goal 1 (Week 4): STGs=LTGs due to ELOS  Weekly Progress Updates: Pt is progressing toward meeting goals. She continues to require additional verbal and visual cues to recall details of information after a delay. She demonstrates improved participation to tasks and understanding of current medications from written list but she will need cont instruction on medications. Pt has trialed regular foods and is tolerating with no overt s/sx of aspiration or penetration but requests she remain with dysphagia 3 consistency and states regular food consistency is too dry. Pt will benefit from cont slp tx to address memory  of functional information, use of external aids, and to increase problem solving and awareness of her errors with everyday tasks.      Intensity: Minumum of 1-2 x/day, 30 to 90 minutes Frequency: 1 to 3 out of 7 days Duration/Length of Stay:   Treatment/Interventions: Cognitive remediation/compensation;Internal/external aids;Multimodal communication approach;Patient/family education;Medication managment;Dysphagia/aspiration precaution training   Daily Session  Skilled Therapeutic Interventions: Skilled intervention focused on diet tolerance and cognition. Pt answered problem solving questions related to medications and times taken with mod A visual cues to information on page. She required increased time for processing. She was able to recall medication purpose with mod A after a delay using list for visual cue. She tolerated regular snack with prolonged mastication but eventually able to clear with liquid wash.She requested to continue with dys 3 diet. Cont therapy per plan of care.     General    Pain Pain Assessment Pain Scale: Faces Faces Pain Scale: No hurt  Therapy/Group: Individual Therapy  Darrol Poke Zinnia Tindall 10/25/2022, 12:50 PM

## 2022-10-25 NOTE — Plan of Care (Signed)
  Problem: Consults Goal: RH STROKE PATIENT EDUCATION Description: See Patient Education module for education specifics  Outcome: Progressing   Problem: RH BOWEL ELIMINATION Goal: RH STG MANAGE BOWEL WITH ASSISTANCE Description: STG Manage Bowel with min Assistance. Outcome: Progressing Goal: RH STG MANAGE BOWEL W/MEDICATION W/ASSISTANCE Description: STG Manage Bowel with Medication with mod I Assistance. Outcome: Progressing   Problem: RH BLADDER ELIMINATION Goal: RH STG MANAGE BLADDER WITH ASSISTANCE Description: STG Manage Bladder With min toileting Assistance Outcome: Progressing   Problem: RH SAFETY Goal: RH STG ADHERE TO SAFETY PRECAUTIONS W/ASSISTANCE/DEVICE Description: STG Adhere to Safety Precautions With cues Assistance/Device. Outcome: Progressing   Problem: RH PAIN MANAGEMENT Goal: RH STG PAIN MANAGED AT OR BELOW PT'S PAIN GOAL Description: < 4 with prns Outcome: Progressing   Problem: RH KNOWLEDGE DEFICIT Goal: RH STG INCREASE KNOWLEDGE OF DIABETES Description: Patient and daughter will be able to manage DM with medications and dietary modifications using educational resources independently Outcome: Progressing Goal: RH STG INCREASE KNOWLEDGE OF HYPERTENSION Description: Patient and daughter will be able to manage HTN with medications and dietary modifications using educational resources independently Outcome: Progressing Goal: RH STG INCREASE KNOWLEDGE OF DYSPHAGIA/FLUID INTAKE Description: Patient and daughter will be able to manage Dysphagia, medications and dietary modifications using educational resources independently Outcome: Progressing Goal: RH STG INCREASE KNOWLEGDE OF HYPERLIPIDEMIA Description: Patient and daughter will be able to manage HLD with medications and dietary modifications using educational resources independently Outcome: Progressing Goal: RH STG INCREASE KNOWLEDGE OF STROKE PROPHYLAXIS Description: Patient and daughter will be able to  manage secondary stroke risks with medications and dietary modifications using educational resources independently Outcome: Progressing   Problem: Education: Goal: Understanding of CV disease, CV risk reduction, and recovery process will improve Outcome: Progressing Goal: Individualized Educational Video(s) Outcome: Progressing   Problem: Activity: Goal: Ability to return to baseline activity level will improve Outcome: Progressing   Problem: Cardiovascular: Goal: Ability to achieve and maintain adequate cardiovascular perfusion will improve Outcome: Progressing Goal: Vascular access site(s) Level 0-1 will be maintained Outcome: Progressing   Problem: Health Behavior/Discharge Planning: Goal: Ability to safely manage health-related needs after discharge will improve Outcome: Progressing

## 2022-10-25 NOTE — Progress Notes (Signed)
PROGRESS NOTE   Subjective/Complaints:  Patient lying in bed, no new concerns or complaints identified.  No events overnight.  ROS: + back and neck pain-improved, +decreased initiation as per therapy- improved. +throbbing in b/l lower extremities-continues, +Constipation-improved. Denies CP, SOB, abd pain, n/v/d, New vision changes.    Objective:   No results found. Recent Labs    10/24/22 0935  WBC 7.2  HGB 14.6  HCT 48.1*  PLT 265      Recent Labs    10/24/22 0935  NA 134*  K 3.8  CL 95*  CO2 28  GLUCOSE 224*  BUN 13  CREATININE 1.19*  CALCIUM 9.3       Intake/Output Summary (Last 24 hours) at 10/25/2022 1413 Last data filed at 10/25/2022 0944 Gross per 24 hour  Intake 236 ml  Output 400 ml  Net -164 ml         Physical Exam: Vital Signs Blood pressure (!) 129/94, pulse 65, temperature 98.6 F (37 C), temperature source Oral, resp. rate 18, height 4\' 11"  (1.499 m), weight 66.7 kg, last menstrual period 06/22/2022, SpO2 100 %.  Constitutional: No distress . Vital signs reviewed. Obese. BMI 30.86. Lying in bed.  HEENT: NCAT, conjugate gaze, , oral membranes moist.  Neck: supple Cardiovascular: RRR without murmur. No JVD    Respiratory/Chest: CTA Bilaterally without wheezes or rales. Normal effort    GI/Abdomen: BS +, non-tender, non-distended Ext: no clubbing or cyanosis, no edema, no focal area of tenderness Psych: very flat, depressed, more irritable today but still polite Skin: dry, warm MSK: weak in L arm and LLE, able to move R arm and RLE around in bed, full MsK exam not performed.  Neurologic: Pt less tired than before. Speech dysarthric, low volume.  Nods or shakes head to most questions.  Language appears to be intact.    PRIOR EXAM: Neurologic: Right gaze preference but can follow to left to a lesser extent. No obvious field cut. Left central 7 and tongue deviation. LUE and LLE 0/5. MAS  1 throughout left side Senses pain and LT in all 4's. No resting tone. DTR's 1+ throughout. Toes down left foot, no clonus. Improved balance   Assessment/Plan: 1. Functional deficits which require 3+ hours per day of interdisciplinary therapy in a comprehensive inpatient rehab setting. Physiatrist is providing close team supervision and 24 hour management of active medical problems listed below. Physiatrist and rehab team continue to assess barriers to discharge/monitor patient progress toward functional and medical goals  Care Tool:  Bathing    Body parts bathed by patient: Left arm, Chest, Abdomen, Front perineal area, Right upper leg, Left upper leg, Face, Right lower leg, Left lower leg   Body parts bathed by helper: Buttocks, Right arm     Bathing assist Assist Level: Minimal Assistance - Patient > 75%     Upper Body Dressing/Undressing Upper body dressing   What is the patient wearing?: Pull over shirt    Upper body assist Assist Level: Minimal Assistance - Patient > 75%    Lower Body Dressing/Undressing Lower body dressing      What is the patient wearing?: Incontinence brief, Pants  Lower body assist Assist for lower body dressing: Moderate Assistance - Patient 50 - 74%     Toileting Toileting Toileting Activity did not occur (Clothing management and hygiene only): N/A (no void or bm)  Toileting assist Assist for toileting: Moderate Assistance - Patient 50 - 74%     Transfers Chair/bed transfer  Transfers assist  Chair/bed transfer activity did not occur: Safety/medical concerns  Chair/bed transfer assist level: Minimal Assistance - Patient > 75%     Locomotion Ambulation   Ambulation assist   Ambulation activity did not occur: Safety/medical concerns          Walk 10 feet activity   Assist  Walk 10 feet activity did not occur: Safety/medical concerns        Walk 50 feet activity   Assist Walk 50 feet with 2 turns activity did not  occur: Safety/medical concerns         Walk 150 feet activity   Assist Walk 150 feet activity did not occur: Safety/medical concerns         Walk 10 feet on uneven surface  activity   Assist Walk 10 feet on uneven surfaces activity did not occur: Safety/medical concerns         Wheelchair     Assist Is the patient using a wheelchair?: Yes Type of Wheelchair: Manual Wheelchair activity did not occur: Safety/medical concerns         Wheelchair 50 feet with 2 turns activity    Assist    Wheelchair 50 feet with 2 turns activity did not occur: Safety/medical concerns       Wheelchair 150 feet activity     Assist  Wheelchair 150 feet activity did not occur: Safety/medical concerns       Blood pressure (!) 129/94, pulse 65, temperature 98.6 F (37 C), temperature source Oral, resp. rate 18, height 4\' 11"  (1.499 m), weight 66.7 kg, last menstrual period 06/22/2022, SpO2 100 %.    Medical Problem List and Plan: 1. 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  Antithrombotics: -DVT/anticoagulation:  Pharmaceutical: Eliquis 5mg  BID             -antiplatelet therapy: N/A 3. Back pain/Pain Management:  Continue magnesium 250mg  HS (>>500mg  QHS). Tylenol prn. Decrease MSIR 15mg  to q8H prn  4. Chronic anxiety, Insomnia: Resumed home atarax 10mg  TID PRN             -continue lexapro 5mg  qhs             -neuropsych eval -10/02/22 started Trazodone 25mg  QHS and Hydroxyzine 25mg  QHS for now, monitor for effect or oversedation; left Hydroxyzine 10mg  TID PRN dosing for anxiety in place  -10/08/22 added Melatonin 5mg  QHS -10/23/22 sleeping disrupted last night, monitor for pattern 5. Neuropsych/cognition: This patient is not capable  of making decisions on her own behalf. 6. Skin/Wound Care:  Routine pressure relief  measures.  7. Fluids/Electrolytes/Nutrition: Monitor I/O. Monitor routine labs weekly, next 10/24/22 8. Acute on chronic combined CHF: HH diet.  -Monitor weights daily and for signs of overload -continue Torsemide 40mg  QD (>>  20mg  QD), Losartan 50mg  QD, Spironolactone 25mg  QD, Jardiance 10mg  QD (>>25mg  QD), and Metoprolol 25mg  BID  -Weight reviewed 3/15 and is significantly downtrending, asked RN to reweigh and discussed with RN what patient has been eating, Monitor weights daily.  -Increase torsemide back to 20mg  daily -4/2 no signs of overload, weight a little up continue to monitor, continue current regimen Filed Weights   10/23/22 0611 10/24/22 0601 10/25/22 0542  Weight: 64.6 kg 64.5 kg 66.7 kg   Vitals:   10/21/22 1306 10/21/22 2053 10/22/22 0407 10/22/22 1314  BP: 110/78 126/89 105/80 124/81   10/22/22 1952 10/23/22 0603 10/23/22 1331 10/23/22 1932  BP: (!) 122/94 112/81 113/83 111/78   10/24/22 0601 10/24/22 1316 10/24/22 1948 10/25/22 0542  BP: 128/87 98/70 121/83 (!) 129/94    9. T2DM: Hgb A1C-10.8  (improving >15 @ 11/23). Was on Lantus 15u w/ novolog 12 units with supper>>not restarted             --monitor BS ac/hs and use SSI for elevated BS             --on SSI, Jardiance 10mg  QD (>>25mg  QD) -10/02/22 CBGs well controlled with 2-3U of novolog use per meal so far, monitor for trends  -3/13 increase Jardiance to 25mg   -d/c ISS, discussed good blood sugar control with patient  -Add magnesium gluconate 250mg  HS>> increased to 500mg  QHS  -4/2 Add glipizide 2.5mg  for improved control CBG (last 3)  Recent Labs    10/24/22 2048 10/25/22 0639 10/25/22 1147  GLUCAP 180* 174* 229*     10. Severe obesity: Educate patient on diet, compliance and exercise to help promote health and mobility. Add magnesium gluconate 250mg  HS, increased to 500mg  QHS 11. Acute on Chronic renal failure/AKI: Diuretic  held. Cr improved 3/13 but worsened 3/18. Decrease torsemide to 10mg . Monitor weekly. Discussed with patient. Weight decreased, decrease torsemide to 10mg , then increased to 20mg  QD.   -4/2 Cr/BUN stable at 1.19/13 yesterday, continue current regimen for now  12. Hyperlipidemia: continue lipitor 80mg  QHS  13. Decreased initiation: increase amantadine to 200mg  daily, continue, appears to be helping, discussed improvements with therapy 14. Vitamin D insufficiency: 21.41 on 10/04/22, continue ergocalciferol 50,000U once per week for 7 weeks. Daily supplement d/ced 15. Bilateral lower extremity throbbing: -10/09/22 SCDs need adjusting, asked nursing to assist; added Robaxin 500mg  q6h PRN for leg spasms -[place] reviewed and negative for clots, asking nursing to check whether SCDs are working, continue SCDs, asked nursing to apply them -Discussed baclofen but she defers due to side effect of constipation.  -10/23/22 now c/o LLE sharp pain, suspect post-CVA related pain, not utilizing PRN Robaxin, but also may benefit from Gabapentin if appropriate-- defer to weekday team.  16. Constipation:   -10/08/22 increased miralax 17g BID, add colace 100mg  QD  -10/18/22 senokot 2 tabs QD  added, plus 2 tabs QD PRN if no BM in 24hrs -Magnesium citrate ordered 3/27 with good effect. Decrease Miralax to daily.  -10/22/22 pt reports LBM yesterday but not documented since 10/19/22; monitor for now, but may need to go back up on Miralax -10/23/22 LBM yesterday, cont regimen  -4/2  LBM yesterday, improved, continue miralax    LOS: 24 days A FACE TO [place] 10/25/2022, 2:13 PM

## 2022-10-25 NOTE — Progress Notes (Signed)
Occupational Therapy Session Note  Patient Details  Name: [name] MRN: [id number] Date of Birth: [birth year]  Today's Date: 10/25/2022 OT Individual Time: 0900-1000 and 1305-1400 OT Individual Time Calculation (min): 60 min and 25 min   Short Term Goals: Week 3:  OT Short Term Goal 1 (Week 3): Patient will don pull over shirt with set up assistance OT Short Term Goal 2 (Week 3): Patient will don pants with min assist OT Short Term Goal 3 (Week 3): Patient will complete toileting with min assist OT Short Term Goal 4 (Week 3): Patient will shower with min assist  Skilled Therapeutic Interventions/Progress Updates:   AM Session:  Patient received supine in bed - brother in room- spoon feeding patient her breakfast.  Patient told brother to leave the room while she washed and dressed.  Patient encouraged to roll toward right and sit at edge of bed.  Patient needs increased time, cueing and min assist to bring left leg to edge of bed, then to floor.  Patient very flat, making statements about how difficult and exhausting everything is, stating it does not seem worth it to go through all of this.  Offered encouragement and continued to push patient to actively engage in her own self care or management of her body.  Patient requesting to wash at sink.  Working to decrease level of assistance for bathing, dressing and toileting.  Patient able to doff her t-shirt without assistance today, and also bring left leg into figure 4 position to don socks for the first time.  Patient appears frustrated with all aspects of self care this session.  Left up in wheelchair with call bell and personal items in reach.    PM Session:  Patient received supine in bed scrolling through her phone - brother at bedside.  Patient agreeable to OT and encouraged to move herself toward edge of bed.  Patient indicates need to void - so assisted patient as she continues to have urgency - and she becomes very upset if her brief is  wet.  Worked on active participation in toilet transfer and patient sat down on toilet with pants still up and voided in brief.  She was unable to control urine.  Patient able to perform hygiene with set up.  Discussed benefit of setting a timer and requesting to be taken to bathroom every 2 hours to avoid urgent episode.  Patient assisted back to chair and worked on increasing independence with donning shoe/AFO.  Patient reporting lower back pain.  Removed solid back from wheelchair and added pillow behind patient.  Left up in wheelchair with personal items and call bell in reach.    Therapy Documentation Precautions:  Precautions Precautions: Fall Precaution Comments: Left hemiparesis Required Braces or Orthoses: Other Brace Other Brace: L Resting hand splint and L PRAFO Restrictions Weight Bearing Restrictions: No  Pain: Pain Assessment Pain Scale: 0-10 Pain Score: 5 Pain Type: Acute pain Pain Location: Back Pain Orientation: Lower Pain Descriptors / Indicators: Aching Pain Frequency: Intermittent Pain Onset: Gradual Pain Intervention(s): Medication (See eMAR)   Therapy/Group: Individual Therapy  Mariah Milling 10/25/2022, 3:22 PM

## 2022-10-26 DIAGNOSIS — R252 Cramp and spasm: Secondary | ICD-10-CM

## 2022-10-26 LAB — GLUCOSE, CAPILLARY
Glucose-Capillary: 206 mg/dL — ABNORMAL HIGH (ref 70–99)
Glucose-Capillary: 246 mg/dL — ABNORMAL HIGH (ref 70–99)
Glucose-Capillary: 201 mg/dL — ABNORMAL HIGH (ref 70–99)
Glucose-Capillary: 160 mg/dL — ABNORMAL HIGH (ref 70–99)

## 2022-10-26 MED ORDER — ESCITALOPRAM OXALATE 10 MG PO TABS
10.0000 mg | ORAL_TABLET | Freq: Every day | ORAL | Status: DC
  Administered 2022-10-26 – 2022-10-31 (×6): 10 mg via ORAL
  Filled 2022-10-26 (×6): qty 1

## 2022-10-26 NOTE — Progress Notes (Signed)
Physical Therapy Session Note  Patient Details  Name: [name] MRN: [id number] Date of Birth: February 11, 1976  Today's Date: 10/26/2022 PT Individual Time: 1415-1458 PT Individual Time Calculation (min): 43 min   Short Term Goals: Week 3:  PT Short Term Goal 1 (Week 3): STG = LTG due to ELOS  Skilled Therapeutic Interventions/Progress Updates:      Pt presents supine in bed to start - denies pain. Attempted to reschedule wheelchair evaluation due to scheduling conflicts with family training tomorrow.   Supine<>sitting EOB with modA for trunk and BLE support. Donned tennis shoes with totalA but not the GRAFO as it's unlikely she will receive one prior to DC.   Transported to ortho rehab gym and worked on car transfers. Completed via stand<>pivot (to accommodate for car height) with modA overall with assist needed for managing LLE in/out of vehicle. Improved understanding of setup and sequencing compared to previous trials.  Nustep completed at L6 resistance with BLE and RUE - gait belt around distal femurs to keep L knee in neutral position. Encouraged full ROM bilaterally and increased cadence. Patient required modA for squat<>pivot transfers to/from nustep to wheelchair.   Ended session seated in w/c in her room with all needs within reach.   Therapy Documentation Precautions:  Precautions Precautions: Fall Precaution Comments: Left hemiparesis Required Braces or Orthoses: Other Brace Other Brace: L Resting hand splint and L PRAFO Restrictions Weight Bearing Restrictions: No General:    Therapy/Group: Individual Therapy  [name] 10/26/2022, 2:59 PM

## 2022-10-26 NOTE — Patient Care Conference (Signed)
Inpatient RehabilitationTeam Conference and Plan of Care Update Date: 10/26/2022   Time: 11:18 AM    Patient Name: [name]      Medical Record Number: [id number]  Date of Birth: June 06, 1976 Sex: Female         Room/Bed: 4M06C/4M06C-01 Payor Info: Payor: MEDICARE / Plan: MEDICARE PART A AND B / Product Type: *No Product type* /    Admit Date/Time:  10/01/2022  1:41 PM  Primary Diagnosis:  Acute ischemic right MCA stroke  Hospital Problems: Principal Problem:   Acute ischemic right MCA stroke Active Problems:   Poorly controlled type 2 diabetes mellitus   Hyperlipidemia associated with type 2 diabetes mellitus   Chronic nausea   Chronic combined systolic and diastolic heart failure   Paroxysmal atrial fibrillation   Severe obesity (BMI >= 40)   Chronic pain   Insomnia   Gaze preference w/left inattention   Left hemiplegia   Mild episode of recurrent major depressive disorder    Expected Discharge Date: Expected Discharge Date: 10/31/22  Team Members Present: Physician leading conference: Dr. Jennye Boroughs Social Worker Present: Ovidio Kin, LCSW Nurse Present: Dorien Chihuahua, RN PT Present: Ginnie Smart, PT OT Present: Antony Salmon, OT SLP Present: Weston Anna, SLP PPS Coordinator present : Gunnar Fusi, SLP     Current Status/Progress Goal Weekly Team Focus  Bowel/Bladder   continent of B/B   maintain continency   assess q shift and PRN    Swallow/Nutrition/ Hydration   Dys. 3 textures with thin liquids (per her preference), supervision   Supervision  Tolerance of current diet    ADL's   Mod assist LB Dressing, Min assist Upper body dressing, min assist toileting - Urgency       ? timed toileting, attention left, L NMR, functional trasnfers    Mobility   mod/maxA bed mobility, modA squat pivot transfers, minA for sit<>stands. Ambulating with +2 skilled assist. Custom wheelchair evaluation scheduled for Thursday 4/4. Her oldest daughter has had  a little hands on training for transfers.   CGA to minA - will downgrade and wheelchair level only  Functional transfers, caregiver training/education, L NMR, DC planning    Communication                Safety/Cognition/ Behavioral Observations  Min-Mod A   Supervision-Min A   functional recall, sustained attention, emergent awareness    Pain   no C/O pain   remain pain free   assess q shift and PRN    Skin   skin intact   maintain skin integrity  assess and adrress q shift and PRN      Discharge Planning:  Home either with daughter or Dad, daughter and her fiance will be here tomorrow for hands on education. Dad has lifting restrictions. Arranging [place] and DME   Team Discussion: Patient with depression post right MCA CVA; flat affect and apathetic demeanor. History of DM with elevated blood sugars; MD adjusting medications and HF; weight is stable. Limited by left inattention, poor initiation and delayed responses.   Patient on target to meet rehab goals: yes, currently needs min - mod assist with ADLs. Requires min - mod assist for bed mobility and completes sit - stand transfers with min assist. Requires mi - mod assist for stand pivot transfers. Needs min assit to manage a wheelchair but able to take steps using a eva walker.  *See Care Plan and progress notes for long and short-term goals.   Revisions to  Treatment Plan:  Regular diet trials; prefers Dysphagia 3 diet Eva walker, three musketeer style walking and hemi-walker trial  Peer support, neuro - psych consult and medications for depression Discussed baclofen for tone in left leg; patient hesitant to take medications Wheelchair eval 10/27/22  Teaching Needs: Safety, medications, dietary modifications, transfers, toileting, DM monitoring, etc.  Current Barriers to Discharge: Decreased caregiver support and Home enviroment access/layout  Possible Resolutions to Barriers: Family education DME: wheelchair, half-  lap tray, hospital bed     Medical Summary Current Status: R MCA infarct, CHF, T2DM, constipation  Barriers to Discharge: Medical stability;Cardiac Complications  Barriers to Discharge Comments: R MCA infarct, CHF, T2DM, constipation, acute on chronic renal failure Possible Resolutions to [employer] Focus: Adjust DM medications, monitor bowel function, monitor renal fx, monitor weight   Continued Need for Acute Rehabilitation Level of Care: The patient requires daily medical management by a physician with specialized training in physical medicine and rehabilitation for the following reasons: Direction of a multidisciplinary physical rehabilitation program to maximize functional independence : Yes Medical management of patient stability for increased activity during participation in an intensive rehabilitation regime.: Yes Analysis of laboratory values and/or radiology reports with any subsequent need for medication adjustment and/or medical intervention. : Yes   I attest that I was present, lead the team conference, and concur with the assessment and plan of the team.   Dorien Chihuahua B 10/26/2022, 5:29 PM

## 2022-10-26 NOTE — Progress Notes (Signed)
Occupational Therapy Weekly Progress Note  Patient Details  Name: [name] MRN: [id number] Date of Birth: 1976-06-24  Beginning of progress report period: October 19, 2022 End of progress report period: October 26, 2022  Today's Date: 10/26/2022 OT Individual Time: 1005-1100 OT Individual Time Calculation (min): 55 min    Patient has met 1 of 3 short term goals.  Patient is progressing, but still needing physical assistance for ADL  Patient continues to demonstrate the following deficits: muscle weakness and muscle joint tightness, impaired timing and sequencing, abnormal tone, unbalanced muscle activation, decreased coordination, and decreased motor planning, decreased visual perceptual skills and decreased visual motor skills, decreased midline orientation, decreased attention to left, and decreased motor planning, decreased initiation, decreased attention, decreased awareness, decreased problem solving, decreased safety awareness, decreased memory, and delayed processing, and decreased standing balance, decreased postural control, hemiplegia, and decreased balance strategies and therefore will continue to benefit from skilled OT intervention to enhance overall performance with BADL.  Patient progressing toward long term goals..  Continue plan of care.  OT Short Term Goals Week 4:  OT Short Term Goal 1 (Week 4): STG= LTG due to LOS  Skilled Therapeutic Interventions/Progress Updates:    Patient presents up in wheelchair fully dressed.  Reports - I stayed dry all night - I did not use the Purewick last night.  Patient very pleased with this as a stepping stone toward discharge.  Patient had daughter send pictures of the bathroom she will be using.  Practiced in ADL apartment simulating close to patient's bathroom at home.  She will transfer toward right side onto the commode and off toward the left.  Have asked for a drop arm commode to be ordered for home.  Will need to practice shower stall  transfers with small shower lip.  Discussed benefit of patient obtaining suction cup grab bars for shower.  Patient taken to bathroom at end of session and thought she might be able to void.  Alerted the NT that patient was left up on commode per her request.    Therapy Documentation Precautions:  Precautions Precautions: Fall Precaution Comments: Left hemiparesis Required Braces or Orthoses: Other Brace Other Brace: L Resting hand splint and L PRAFO Restrictions Weight Bearing Restrictions: No  Pain: Pain Assessment Pain Scale: Faces Pain Score: 0-No pain Faces Pain Scale: No hurt    Therapy/Group: Individual Therapy  Mariah Milling 10/26/2022, 12:20 PM

## 2022-10-26 NOTE — Progress Notes (Signed)
Physical Therapy Session Note  Patient Details  Name: [name] MRN: [id number] Date of Birth: 07/31/1975  Today's Date: 10/26/2022 PT Individual Time: 0800-0857 PT Individual Time Calculation (min): 57 min   Short Term Goals: Week 3:  PT Short Term Goal 1 (Week 3): STG = LTG due to ELOS  Skilled Therapeutic Interventions/Progress Updates:     Pt in bed sleeping with all lights off - awakens to voice and agreeable to PT tx. Continues to have a very flat affect - concern for depression. Patient setup for peer to peer with care team to help with adapting to situation. Pt reports she didn't wear her purewick last night and didn't have any accidents.   Supine<>sitting EOB with modA for trunk and LLE support. Noted increased tone in LUE with increased internal rotation, bicep flexion, and shoulder elevation - worked on relaxing and gentle stretching for PROM. Used Stedy to transfer patient to toilet and patient continent of bladder - totalA for changing brief and donned LB dressing for time. Donned L GRAFO with dependant assist as well.  Transported to main rehab gym.   Facilitated gait training using EVA walker with +2 assist for steering only with PT managing LLE through gait cycle. Ambulated 32ft on level surface in straight path.  Primary gait deficits include: -L knee flexed in stance -step to gait pattern -inconsistent step length on R -trendelenburg on L  Continued gait training but introduced hemi-walker [facility]) for functional gait training. Patient ambulated 55ft + 22ft with modA of 1 person with similar deficits noted as above.   Limited terminal knee extension on L, suspect this is contributed by lack of ROM DF in ankle when knee is extended.  Returned to her room and ended session seated in w/c with 1/2 lap tray on LUE, call bell and cell phone within reach.   Therapy Documentation Precautions:  Precautions Precautions: Fall Precaution Comments: Left hemiparesis Required  Braces or Orthoses: Other Brace Other Brace: L Resting hand splint and L PRAFO Restrictions Weight Bearing Restrictions: No General:     Therapy/Group: Individual Therapy  Cindy Fullman P [name] PT 10/26/2022, 7:35 AM

## 2022-10-26 NOTE — Plan of Care (Signed)
  Problem: Consults Goal: RH STROKE PATIENT EDUCATION Description: See Patient Education module for education specifics  Outcome: Progressing   Problem: RH BOWEL ELIMINATION Goal: RH STG MANAGE BOWEL WITH ASSISTANCE Description: STG Manage Bowel with min Assistance. Outcome: Progressing Goal: RH STG MANAGE BOWEL W/MEDICATION W/ASSISTANCE Description: STG Manage Bowel with Medication with mod I Assistance. Outcome: Progressing   Problem: RH BLADDER ELIMINATION Goal: RH STG MANAGE BLADDER WITH ASSISTANCE Description: STG Manage Bladder With min toileting Assistance Outcome: Progressing   Problem: RH SAFETY Goal: RH STG ADHERE TO SAFETY PRECAUTIONS W/ASSISTANCE/DEVICE Description: STG Adhere to Safety Precautions With cues Assistance/Device. Outcome: Progressing   Problem: RH PAIN MANAGEMENT Goal: RH STG PAIN MANAGED AT OR BELOW PT'S PAIN GOAL Description: < 4 with prns Outcome: Progressing   Problem: RH KNOWLEDGE DEFICIT Goal: RH STG INCREASE KNOWLEDGE OF DIABETES Description: Patient and daughter will be able to manage DM with medications and dietary modifications using educational resources independently Outcome: Progressing Goal: RH STG INCREASE KNOWLEDGE OF HYPERTENSION Description: Patient and daughter will be able to manage HTN with medications and dietary modifications using educational resources independently Outcome: Progressing Goal: RH STG INCREASE KNOWLEDGE OF DYSPHAGIA/FLUID INTAKE Description: Patient and daughter will be able to manage Dysphagia, medications and dietary modifications using educational resources independently Outcome: Progressing Goal: RH STG INCREASE KNOWLEGDE OF HYPERLIPIDEMIA Description: Patient and daughter will be able to manage HLD with medications and dietary modifications using educational resources independently Outcome: Progressing Goal: RH STG INCREASE KNOWLEDGE OF STROKE PROPHYLAXIS Description: Patient and daughter will be able to  manage secondary stroke risks with medications and dietary modifications using educational resources independently Outcome: Progressing   Problem: Education: Goal: Understanding of CV disease, CV risk reduction, and recovery process will improve Outcome: Progressing Goal: Individualized Educational Video(s) Outcome: Progressing   Problem: Activity: Goal: Ability to return to baseline activity level will improve Outcome: Progressing   Problem: Cardiovascular: Goal: Ability to achieve and maintain adequate cardiovascular perfusion will improve Outcome: Progressing Goal: Vascular access site(s) Level 0-1 will be maintained Outcome: Progressing   Problem: Health Behavior/Discharge Planning: Goal: Ability to safely manage health-related needs after discharge will improve Outcome: Progressing

## 2022-10-26 NOTE — Progress Notes (Signed)
Speech Language Pathology Daily Session Note  Patient Details  Name: [name] MRN: [id number] Date of Birth: 10/06/1975  Today's Date: 10/26/2022 SLP Individual Time: 0915-1000 SLP Individual Time Calculation (min): 45 min  Short Term Goals: Week 4: SLP Short Term Goal 1 (Week 4): STGs=LTGs due to ELOS  Skilled Therapeutic Interventions: Skilled intervention focused on swallowing. Pt seen with breakfast tray given to her during session. She refused boiled egg and was agreeable to cereal and milk. Improved mastication and oral clearance with cereal. Delayed recall task completed with verbal messages containing 3-4 details with mod A verbal repetition of information. She participated well in skilled slp intervention. Educated patient on visual aids and a notebook to write down important information once home. Cont therapy per plan of care.      Pain Pain Assessment Pain Scale: Faces Pain Score: 0-No pain Faces Pain Scale: No hurt  Therapy/Group: Individual Therapy  Darrol Poke Earlisha Sharples 10/26/2022, 11:49 AM

## 2022-10-26 NOTE — Progress Notes (Addendum)
Patient ID: [name], female   DOB: [birth year], 47 y.o.   MRN: [id number]  Met with pt to discuss peer support and she is agreeable and signed the consent form. Will work on getting a peer in to talk with pt.  2:24 PM Met with pt to give team conference update regarding progress in therapies this week and daughter coming in tomorrow for education along with her fiance. Pt somewhat down feeling she would have progressed further before discharging home.

## 2022-10-26 NOTE — Progress Notes (Signed)
PROGRESS NOTE   Subjective/Complaints:  NO new complaints this AM. Therapy noted her mood has been decreased at times.   ROS: + back and neck pain-improved, +decreased initiation as per therapy- improved. +throbbing in b/l lower extremities-continues, +Constipation-improved. Denies CP, SOB, abd pain, n/v/d, New vision changes.    Objective:   No results found. Recent Labs    10/24/22 0935  WBC 7.2  HGB 14.6  HCT 48.1*  PLT 265       Recent Labs    10/24/22 0935  NA 134*  K 3.8  CL 95*  CO2 28  GLUCOSE 224*  BUN 13  CREATININE 1.19*  CALCIUM 9.3        Intake/Output Summary (Last 24 hours) at 10/26/2022 0837 Last data filed at 10/26/2022 0023 Gross per 24 hour  Intake 236 ml  Output 500 ml  Net -264 ml         Physical Exam: Vital Signs Blood pressure 107/66, pulse (!) 59, temperature 97.8 F (36.6 C), temperature source Oral, resp. rate 16, height 4\' 11"  (1.499 m), weight 65.8 kg, last menstrual period 06/22/2022, SpO2 96 %.  Constitutional: No distress . Vital signs reviewed. Obese. BMI 30.86. Lying in bed.  HEENT: NCAT, conjugate gaze, , oral membranes moist.  Neck: supple Cardiovascular: RRR without murmur. No JVD    Respiratory/Chest: CTA Bilaterally without wheezes or rales. Normal effort    GI/Abdomen: BS +, non-tender, non-distended Ext: no clubbing or cyanosis, no edema, no focal area of tenderness Psych: very flat, depressed Skin: dry, warm MSK: weak in L arm and LLE, able to move R arm and RLE around in bed, full MsK exam not performed.  Neurologic: Pt less tired than before. Speech dysarthric, low volume.  Minimal verbal output.  Language appears to be intact. Left inattention. Increased tone LUE and LLE   PRIOR EXAM: Neurologic: Right gaze preference but can follow to left to a lesser extent. No obvious field cut. Left central 7 and tongue deviation. LUE and LLE 0/5. MAS 1  throughout left side Senses pain and LT in all 4's. No resting tone. DTR's 1+ throughout. Toes down left foot, no clonus. Improved balance   Assessment/Plan: 1. Functional deficits which require 3+ hours per day of interdisciplinary therapy in a comprehensive inpatient rehab setting. Physiatrist is providing close team supervision and 24 hour management of active medical problems listed below. Physiatrist and rehab team continue to assess barriers to discharge/monitor patient progress toward functional and medical goals  Care Tool:  Bathing    Body parts bathed by patient: Left arm, Chest, Abdomen, Front perineal area, Right upper leg, Left upper leg, Face, Right lower leg, Left lower leg   Body parts bathed by helper: Buttocks, Right arm     Bathing assist Assist Level: Minimal Assistance - Patient > 75%     Upper Body Dressing/Undressing Upper body dressing   What is the patient wearing?: Pull over shirt    Upper body assist Assist Level: Minimal Assistance - Patient > 75%    Lower Body Dressing/Undressing Lower body dressing      What is the patient wearing?: Incontinence brief, Pants  Lower body assist Assist for lower body dressing: Moderate Assistance - Patient 50 - 74%     Toileting Toileting Toileting Activity did not occur (Clothing management and hygiene only): N/A (no void or bm)  Toileting assist Assist for toileting: Moderate Assistance - Patient 50 - 74%     Transfers Chair/bed transfer  Transfers assist  Chair/bed transfer activity did not occur: Safety/medical concerns  Chair/bed transfer assist level: Minimal Assistance - Patient > 75%     Locomotion Ambulation   Ambulation assist   Ambulation activity did not occur: Safety/medical concerns          Walk 10 feet activity   Assist  Walk 10 feet activity did not occur: Safety/medical concerns        Walk 50 feet activity   Assist Walk 50 feet with 2 turns activity did not  occur: Safety/medical concerns         Walk 150 feet activity   Assist Walk 150 feet activity did not occur: Safety/medical concerns         Walk 10 feet on uneven surface  activity   Assist Walk 10 feet on uneven surfaces activity did not occur: Safety/medical concerns         Wheelchair     Assist Is the patient using a wheelchair?: Yes Type of Wheelchair: Manual Wheelchair activity did not occur: Safety/medical concerns         Wheelchair 50 feet with 2 turns activity    Assist    Wheelchair 50 feet with 2 turns activity did not occur: Safety/medical concerns       Wheelchair 150 feet activity     Assist  Wheelchair 150 feet activity did not occur: Safety/medical concerns       Blood pressure 107/66, pulse (!) 59, temperature 97.8 F (36.6 C), temperature source Oral, resp. rate 16, height 4\' 11"  (1.499 m), weight 65.8 kg, last menstrual period 06/22/2022, SpO2 96 %.    Medical Problem List and Plan: 1. Functional deficits secondary to embolic Right MCA infarct             -patient may shower             -ELOS/Goals: 21-28 days, min assist + goals with PT, OT, SLP             -will order PRAFO, WHO for left side  -Continue CIR, d/c goal 10/31/22  -Team conference today please see physician documentation under team conference tab, met with team  to discuss problems,progress, and goals. Formulized individual treatment plan based on medical history, underlying problem and comorbidities.    2.  Antithrombotics: -DVT/anticoagulation:  Pharmaceutical: Eliquis 5mg  BID             -antiplatelet therapy: N/A 3. Back pain/Pain Management:  Continue magnesium 250mg  HS (>>500mg  QHS). Tylenol prn. Decrease MSIR 15mg  to q8H prn  4. Chronic anxiety, Insomnia: Resumed home atarax 10mg  TID PRN             -continue lexapro 5mg  qhs             -neuropsych eval -10/02/22 started Trazodone 25mg  QHS and Hydroxyzine 25mg  QHS for now, monitor for effect or  oversedation; left Hydroxyzine 10mg  TID PRN dosing for anxiety in place  -10/08/22 added Melatonin 5mg  QHS -10/23/22 sleeping disrupted last night, monitor for pattern 4/3 increase lexapro to 10mg  for decreased mood/anxiety 5. Neuropsych/cognition: This patient is not capable of making decisions on her own behalf. 6. Skin/Wound  Care:  Routine pressure relief  measures.  7. Fluids/Electrolytes/Nutrition: Monitor I/O. Monitor routine labs weekly, next 10/24/22 8. Acute on chronic combined CHF: HH diet.  -Monitor weights daily and for signs of overload -continue Torsemide 40mg  QD (>>20mg  QD), Losartan 50mg  QD, Spironolactone 25mg  QD, Jardiance 10mg  QD (>>25mg  QD), and Metoprolol 25mg  BID  -Weight reviewed 3/15 and is significantly downtrending, asked RN to reweigh and discussed with RN what patient has been eating, Monitor weights daily.  -Increase torsemide back to 20mg  daily -4/2 no signs of overload, weight a little up continue to monitor, continue current regimen -4/3 wt appears stable Filed Weights   10/24/22 0601 10/25/22 0542 10/26/22 0519  Weight: 64.5 kg 66.7 kg 65.8 kg   Vitals:   10/22/22 1314 10/22/22 1952 10/23/22 0603 10/23/22 1331  BP: 124/81 (!) 122/94 112/81 113/83   10/23/22 1932 10/24/22 0601 10/24/22 1316 10/24/22 1948  BP: 111/78 128/87 98/70 121/83   10/25/22 0542 10/25/22 1509 10/25/22 1936 10/26/22 0519  BP: (!) 129/94 125/87 111/79 107/66    9. T2DM: Hgb A1C-10.8  (improving >15 @ 11/23). Was on Lantus 15u w/ novolog 12 units with supper>>not restarted             --monitor BS ac/hs and use SSI for elevated BS             --on SSI, Jardiance 10mg  QD (>>25mg  QD) -10/02/22 CBGs well controlled with 2-3U of novolog use per meal so far, monitor for trends  -3/13 increase Jardiance to 25mg   -d/c ISS, discussed good blood sugar control with patient  -Add magnesium gluconate 250mg  HS>> increased to 500mg  QHS  -4/2 Add glipizide 2.5mg  for improved control  -4/3 monitor  response to medication change CBG (last 3)  Recent Labs    10/25/22 1641 10/25/22 2120 10/26/22 0606  GLUCAP 170* 186* 206*     10. Severe obesity: Educate patient on diet, compliance and exercise to help promote health and mobility. Add magnesium gluconate 250mg  HS, increased to 500mg  QHS 11. Acute on Chronic renal failure/AKI: Diuretic held. Cr improved 3/13 but worsened 3/18. Decrease torsemide to 10mg . Monitor weekly. Discussed with patient. Weight decreased, decrease torsemide to 10mg , then increased to 20mg  QD.   -4/2 Cr/BUN stable at 1.19/13 yesterday, continue current regimen for now  12. Hyperlipidemia: continue lipitor 80mg  QHS  13. Decreased initiation: increase amantadine to 200mg  daily, continue, appears to be helping, discussed improvements with therapy 14. Vitamin D insufficiency: 21.41 on 10/04/22, continue ergocalciferol 50,000U once per week for 7 weeks. Daily supplement d/ced 15. Bilateral lower extremity throbbing: -10/09/22 SCDs need adjusting, asked nursing to assist; added Robaxin 500mg  q6h PRN for leg spasms -[place] reviewed and negative for clots, asking nursing to check whether SCDs are working, continue SCDs, asked nursing to apply them -Discussed baclofen but she defers due to side effect of constipation.  -10/23/22 now c/o LLE sharp pain, suspect post-CVA related pain, not utilizing PRN Robaxin, but also may benefit from Gabapentin if appropriate-- defer to weekday team.  16. Constipation:   -10/08/22 increased miralax 17g BID, add colace 100mg  QD  -10/18/22 senokot 2 tabs QD  added, plus 2 tabs QD PRN if no BM in 24hrs -Magnesium citrate ordered 3/27 with good effect. Decrease Miralax to daily.  -10/22/22 pt reports LBM yesterday but not documented since 10/19/22; monitor for now, but may need to go back up on Miralax -10/23/22 LBM yesterday, cont regimen  -4/2  LBM yesterday, improved, continue miralax 17. Increased tone  -  4/3 consider baclofen     LOS: 25  days A FACE TO FACE EVALUATION WAS PERFORMED  Jennye Boroughs 10/26/2022, 8:37 AM

## 2022-10-27 LAB — GLUCOSE, CAPILLARY
Glucose-Capillary: 111 mg/dL — ABNORMAL HIGH (ref 70–99)
Glucose-Capillary: 191 mg/dL — ABNORMAL HIGH (ref 70–99)
Glucose-Capillary: 186 mg/dL — ABNORMAL HIGH (ref 70–99)
Glucose-Capillary: 133 mg/dL — ABNORMAL HIGH (ref 70–99)

## 2022-10-27 MED ORDER — GLIPIZIDE 5 MG PO TABS
5.0000 mg | ORAL_TABLET | Freq: Every day | ORAL | Status: DC
  Administered 2022-10-28 – 2022-11-01 (×5): 5 mg via ORAL
  Filled 2022-10-27 (×5): qty 1

## 2022-10-27 NOTE — Progress Notes (Signed)
Physical Therapy Session Note  Patient Details  Name: [name] MRN: [id number] Date of Birth: 10-06-1975  Today's Date: 10/27/2022 PT Individual Time: [id number] + 1300-1400 PT Individual Time Calculation (min): 29 min  + 60 min  Short Term Goals: Week 3:  PT Short Term Goal 1 (Week 3): STG = LTG due to ELOS  Skilled Therapeutic Interventions/Progress Updates:      1st session: Pt sleeping in bed on arrival - awakens to voice and agreeable to PT tx. Continues to have significant flat affect - appears more depressed compared to previous weeks - MD has been made aware during team conference yesterday.   Removed L PRAFO an L WHO. Supine<>sitting EOB with modA for trunk support and LLE management. Facilitated squat<>pivot transfer towards her stronger R side with minA - patient elects to complete transfer via stand step transfer but due to L sided weakness and inability to move LLE on her own, safer to work on squat pivots. Discussed this and educated patient on the difference b/w the transfers. Patient initially not receptive to education and allowed error for her to recognize safety concerns. Practiced a few squat<>pivot transfers towards both directions from mat table in rehab gyms. Due to height difference b/w wheelchair and mat table, more difficult for her to complete.   Returned to her room and ended session seated in w/c with all needs met and call bell in reach. Made aware of upcoming therapy schedule.     2nd session: Session focused on family education and training. Patient's father and boyfriend present at the start and then her 2 daughters arriving in the middle of the session.   Discussed at length will all members PT goals, PT POC, DME rec's (hospital bed, custom wheelchair) and follow therapy (HHPT). Discussed primary deficits related to CVA (L sided weakness, tone, urinary urge incontinence, and impaired mobility). Recommended wearing brief's/Depends at nighttime to manage  urge incontinence.   Demonstrated bed mobility using hospital bed features and how to safely assist patient with supine<>sitting, avoiding any pulling on her paretic limbs. Reviewed stand vs squat pivot transfers - recommending squat pivot for patient and caregiver safety. Demonstrated setup, technique, and sequencing and used teach back method with family.   Both daughters performed hands on assist from wheelchair to mat table transfers towards both stronger and weaker directions. PT initially providing CGA for safety and then providing close supervision for safety. Also had family complete hands on practice for car transfers after PT demonstrated proper technique.  Recommended to family that they have x2 person assist for safety for all transfers to start when they transition home - all voiced understanding. Reviewed wheelchair level only, ambulation and stair navigation are not recommended at this time for safety concerns.   Patient concluded session seated in w/c with family present.      Therapy Documentation Precautions:  Precautions Precautions: Fall Precaution Comments: Left hemiparesis Required Braces or Orthoses: Other Brace Other Brace: L Resting hand splint and L PRAFO Restrictions Weight Bearing Restrictions: No General:     Therapy/Group: Individual Therapy  [name] 10/27/2022, 7:42 AM

## 2022-10-27 NOTE — Progress Notes (Signed)
Occupational Therapy Session Note  Patient Details  Name: [name] MRN: [id number] Date of Birth: 1975-11-13  Today's Date: 10/27/2022 OT Individual Time: [id number] session 1 OT Individual Time Calculation (min): 41 min  Session 2: [id number] ( 12 mins missed d/t pt refusal " over it today" )   Short Term Goals: Week 4:  OT Short Term Goal 1 (Week 4): STG= LTG due to LOS  Skilled Therapeutic Interventions/Progress Updates:  Session 1: Pt greeted seated in w/c, pt very flat but agreeable to OT intervention. Discussed plan for family ed this afternoon with plan to practice toilet transfers to [facility]. Pt reports plan to complete wash ups at home from sink level and defer shower transfers.   Total A transport to gym where pt completed stand pivots and squat pivots to EOM with an emphasis on pt directing level of care however pt reports "just help me stand and I can do the rest." Education provided on doffing leg rests/arm rests for transfers and difference between stand pivots and squat pivots.   Additionally worked on sit>stands facing EOM with an emphasis on dynamic reaching with RUE with an emphasis on weight shifting to LLE for NMR and crossing midline. Pt able to stand with MIN A but needed MAX A at L quad for support, no quad activation noted during trials.  Pt declined working on NMR to LUE d/t pain in LUE.           Ended session with pt supine in bed with all needs within reach and safety alarm activated.       Session 2:  pt greeted seated in w/c with family present for family ed ( 2 daughters, father and boyfriend). General education provided on pts current level of assist for ADLS and functional mobility. Education provided on recommendation for squat pivots to [facility] and completing bathing from sink level, and dressing from w/c level/EOB( if w/c does not fit into bathroom at sink recommend bed baths as plan B.)   Daughter assisted pt to [facility] via squat pivot however daughter  prefers to total A her to [facility] vs allowing pt to initiate transfer. Education provided to allow pt to initiate transfer prior to beginning to total A pt to [facility] as well as cueing pt to reach to arm rest as well as making sure LLE is positioned correctly prior to transfer.   Once on [facility] daughter able to practice sit>stands from [facility] with recommendation for daughter to stand at pts L side with LLE blocked while pt stands for pericare and clothing mgmt.   Education provided on splint wear/care as well as handling techniques for LUE as well as encouraging pt to continue to keep incorporating LUE into ADLs even though pt c/o pain. Daughter reports pt will be in hospital bed at night and pt currently gets up frequently at the night to urinate, recommended briefs for night time and perhaps baby monitor at night to allow for increased safety at night if pt really needs to get to toilet.   Wanted to continue practicing tranfers with other daughter, dad and boyfriend however pt reports being "over it today." Pt also stated that everyone that was going to be doing hands on assistance had done so today. Recommended family return 4/5 for continued practice.       Ended session with pt seated in w/c with all needs within reach.     Therapy Documentation Precautions:  Precautions Precautions: Fall Precaution Comments: Left hemiparesis Required Braces  or Orthoses: Other Brace Other Brace: L Resting hand splint and L PRAFO Restrictions Weight Bearing Restrictions: No  Pain: unrated pain reprorted in LUE, rest breaks and repositioning provided as needed.     Therapy/Group: Individual Therapy  Corinne Ports [facility] 10/27/2022, 12:16 PM

## 2022-10-27 NOTE — Plan of Care (Signed)
Goals downgraded due to slower than anticipated progress   Problem: RH Balance Goal: LTG Patient will maintain dynamic sitting balance (PT) Description: LTG:  Patient will maintain dynamic sitting balance with assistance during mobility activities (PT) Flowsheets (Taken 10/27/2022 1419) LTG: Pt will maintain dynamic sitting balance during mobility activities with:: Minimal Assistance - Patient > 75% Goal: LTG Patient will maintain dynamic standing balance (PT) Description: LTG:  Patient will maintain dynamic standing balance with assistance during mobility activities (PT) Flowsheets (Taken 10/27/2022 1419) LTG: Pt will maintain dynamic standing balance during mobility activities with:: Minimal Assistance - Patient > 75%   Problem: Sit to Stand Goal: LTG:  Patient will perform sit to stand with assistance level (PT) Description: LTG:  Patient will perform sit to stand with assistance level (PT) Flowsheets (Taken 10/27/2022 1419) LTG: PT will perform sit to stand in preparation for functional mobility with assistance level: Minimal Assistance - Patient > 75%   Problem: RH Bed Mobility Goal: LTG Patient will perform bed mobility with assist (PT) Description: LTG: Patient will perform bed mobility with assistance, with/without cues (PT). Flowsheets (Taken 10/27/2022 1419) LTG: Pt will perform bed mobility with assistance level of: Moderate Assistance - Patient 50 - 74%   Problem: RH Bed to Chair Transfers Goal: LTG Patient will perform bed/chair transfers w/assist (PT) Description: LTG: Patient will perform bed to chair transfers with assistance (PT). Flowsheets (Taken 10/27/2022 1419) LTG: Pt will perform Bed to Chair Transfers with assistance level: Minimal Assistance - Patient > 75%   Problem: RH Car Transfers Goal: LTG Patient will perform car transfers with assist (PT) Description: LTG: Patient will perform car transfers with assistance (PT). Flowsheets (Taken 10/27/2022 1419) LTG: Pt will  perform car transfers with assist:: Moderate Assistance - Patient 50 - 74%   Problem: RH Furniture Transfers Goal: LTG Patient will perform furniture transfers w/assist (OT/PT) Description: LTG: Patient will perform furniture transfers  with assistance (OT/PT). Flowsheets (Taken 10/27/2022 1419) LTG: Pt will perform furniture transfers with assist:: Moderate Assistance - Patient 50 - 74%   Problem: RH Wheelchair Mobility Goal: LTG Patient will propel w/c in controlled environment (PT) Description: LTG: Patient will propel wheelchair in controlled environment, # of feet with assist (PT) Flowsheets (Taken 10/27/2022 1419) LTG: Pt will propel w/c in controlled environ  assist needed:: Maximal Assistance - Patient 25 - 49% LTG: Propel w/c distance in controlled environment: 150 Goal: LTG Patient will propel w/c in home environment (PT) Description: LTG: Patient will propel wheelchair in home environment, # of feet with assistance (PT). Flowsheets (Taken 10/27/2022 1419) LTG: Pt will propel w/c in home environ  assist needed:: Maximal Assistance - Patient 25 - 49% LTG: Propel w/c distance in home environment: 10'

## 2022-10-27 NOTE — Plan of Care (Signed)
DC ambulation and stair goals - patient will be wheelchair level only at DC due to slower than anticipated progress and safety concerns.   Problem: RH Ambulation Goal: LTG Patient will ambulate in controlled environment (PT) Description: LTG: Patient will ambulate in a controlled environment, # of feet with assistance (PT). Outcome: Not Applicable Goal: LTG Patient will ambulate in home environment (PT) Description: LTG: Patient will ambulate in home environment, # of feet with assistance (PT). Outcome: Not Applicable   Problem: RH Stairs Goal: LTG Patient will ambulate up and down stairs w/assist (PT) Description: LTG: Patient will ambulate up and down # of stairs with assistance (PT) Outcome: Not Applicable

## 2022-10-27 NOTE — Progress Notes (Signed)
PROGRESS NOTE   Subjective/Complaints:  Pt reports reasonable sleep. Not much appetite. Denied any pain today. Just was cleaned up by tech.   ROS: Limited due to cognitive/behavioral    Objective:   No results found. Recent Labs    10/24/22 0935  WBC 7.2  HGB 14.6  HCT 48.1*  PLT 265      Recent Labs    10/24/22 0935  NA 134*  K 3.8  CL 95*  CO2 28  GLUCOSE 224*  BUN 13  CREATININE 1.19*  CALCIUM 9.3       Intake/Output Summary (Last 24 hours) at 10/27/2022 0909 Last data filed at 10/27/2022 [id number] Gross per 24 hour  Intake 477 ml  Output --  Net 477 ml        Physical Exam: Vital Signs Blood pressure 111/81, pulse 63, temperature 98.5 F (36.9 C), temperature source Oral, resp. rate 18, height 4\' 11"  (1.499 m), weight 68.2 kg, last menstrual period 06/22/2022, SpO2 99 %.  Constitutional: No distress . Vital signs reviewed. HEENT: NCAT, EOMI, oral membranes moist Neck: supple Cardiovascular: RRR without murmur. No JVD    Respiratory/Chest: CTA Bilaterally without wheezes or rales. Normal effort    GI/Abdomen: BS +, non-tender, non-distended Ext: no clubbing, cyanosis, or edema Psych: pt is flat but cooperative  Skin: dry, warm MSK: no jt tenderness. Normal PROM Neurologic: Alert, makes eye contact. Left central 7.  Speech dysarthric, low volume.  Minimal verbal output.  Language appears to be intact. Left inattention. MILDLY increased tone LUE and LLE---easy ROM. LUE and LLE 0/5 with decreased sensation to LT/pain on left.      Assessment/Plan: 1. Functional deficits which require 3+ hours per day of interdisciplinary therapy in a comprehensive inpatient rehab setting. Physiatrist is providing close team supervision and 24 hour management of active medical problems listed below. Physiatrist and rehab team continue to assess barriers to discharge/monitor patient progress toward functional and  medical goals  Care Tool:  Bathing    Body parts bathed by patient: Left arm, Chest, Abdomen, Front perineal area, Right upper leg, Left upper leg, Face, Right lower leg, Left lower leg, Buttocks   Body parts bathed by helper: Right arm     Bathing assist Assist Level: Minimal Assistance - Patient > 75%     Upper Body Dressing/Undressing Upper body dressing   What is the patient wearing?: Pull over shirt    Upper body assist Assist Level: Minimal Assistance - Patient > 75%    Lower Body Dressing/Undressing Lower body dressing      What is the patient wearing?: Incontinence brief, Pants     Lower body assist Assist for lower body dressing: Moderate Assistance - Patient 50 - 74%     Toileting Toileting Toileting Activity did not occur Landscape architect and hygiene only): N/A (no void or bm)  Toileting assist Assist for toileting: Minimal Assistance - Patient > 75%     Transfers Chair/bed transfer  Transfers assist  Chair/bed transfer activity did not occur: Safety/medical concerns  Chair/bed transfer assist level: Minimal Assistance - Patient > 75%     Locomotion Ambulation   Ambulation assist   Ambulation activity  did not occur: Safety/medical concerns          Walk 10 feet activity   Assist  Walk 10 feet activity did not occur: Safety/medical concerns        Walk 50 feet activity   Assist Walk 50 feet with 2 turns activity did not occur: Safety/medical concerns         Walk 150 feet activity   Assist Walk 150 feet activity did not occur: Safety/medical concerns         Walk 10 feet on uneven surface  activity   Assist Walk 10 feet on uneven surfaces activity did not occur: Safety/medical concerns         Wheelchair     Assist Is the patient using a wheelchair?: Yes Type of Wheelchair: Manual Wheelchair activity did not occur: Safety/medical concerns         Wheelchair 50 feet with 2 turns  activity    Assist    Wheelchair 50 feet with 2 turns activity did not occur: Safety/medical concerns       Wheelchair 150 feet activity     Assist  Wheelchair 150 feet activity did not occur: Safety/medical concerns       Blood pressure 111/81, pulse 63, temperature 98.5 F (36.9 C), temperature source Oral, resp. rate 18, height 4\' 11"  (1.499 m), weight 68.2 kg, last menstrual period 06/22/2022, SpO2 99 %.    Medical Problem List and Plan: 1. 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  Antithrombotics: -DVT/anticoagulation:  Pharmaceutical: Eliquis 5mg  BID             -antiplatelet therapy: N/A 3. Back pain/Pain Management:  Continue magnesium 250mg  HS (>>500mg  QHS). Tylenol prn. Decrease MSIR 15mg  to q8H prn  4. Chronic anxiety, Insomnia: Resumed home atarax 10mg  TID PRN             -continue lexapro 5mg  qhs             -neuropsych eval -10/02/22 started Trazodone 25mg  QHS and Hydroxyzine 25mg  QHS for now, monitor for effect or oversedation; left Hydroxyzine 10mg  TID PRN dosing for anxiety in place  -10/08/22 added Melatonin 5mg  QHS  -4/3 increased lexapro to 10mg  for decreased mood/anxiety -4/4 some of pt's affect is due to right MCA infarct  5. Neuropsych/cognition: This patient is not capable of making decisions on her own behalf. 6. Skin/Wound Care:  Routine pressure relief  measures.  7. Fluids/Electrolytes/Nutrition: Monitor I/O. Monitor routine labs weekly, next 10/24/22 8. Acute on chronic combined CHF: HH diet.  -Monitor weights daily and for signs of overload -continue Torsemide 40mg  QD (>>20mg  QD), Losartan 50mg  QD, Spironolactone 25mg  QD, Jardiance 10mg  QD (>>25mg  QD), and Metoprolol 25mg  BID  -Weight reviewed 3/15 and is significantly  downtrending, asked RN to reweigh and discussed with RN what patient has been eating, Monitor weights daily.  -Increase torsemide back to 20mg  daily -4/2 no signs of overload, weight a little up continue to monitor, continue current regimen -4/4 wt appears stable Filed Weights   10/25/22 0542 10/26/22 0519 10/27/22 0445  Weight: 66.7 kg 65.8 kg  68.2 kg   Vitals:   10/23/22 1932 10/24/22 0601 10/24/22 1316 10/24/22 1948  BP: 111/78 128/87 98/70 121/83   10/25/22 0542 10/25/22 1509 10/25/22 1936 10/26/22 0519  BP: (!) 129/94 125/87 111/79 107/66   10/26/22 1317 10/26/22 1811 10/26/22 1939 10/27/22 0445  BP: (!) 142/132 116/86 (!) 117/91 111/81    9. T2DM: Hgb A1C-10.8  (improving >15 @ 11/23). Was on Lantus 15u w/ novolog 12 units with supper>>not restarted             --monitor BS ac/hs and use SSI for elevated BS             --on SSI, Jardiance 10mg  QD (>>25mg  QD) -10/02/22 CBGs well controlled with 2-3U of novolog use per meal so far, monitor for trends  -3/13 increase Jardiance to 25mg   -d/c ISS, discussed good blood sugar control with patient  -Add magnesium gluconate 250mg  HS>> increased to 500mg  QHS  -4/2 Add glipizide 2.5mg  for improved control  -4/4 improving but still high in PM -increase glipizide to 5mg  daily  CBG (last 3)  Recent Labs    10/26/22 1637 10/26/22 2118 10/27/22 0634  GLUCAP 201* 160* 111*    10. Severe obesity: Educate patient on diet, compliance and exercise to help promote health and mobility. Add magnesium gluconate 250mg  HS, increased to 500mg  QHS 11. Acute on Chronic renal failure/AKI: Diuretic held. Cr improved 3/13 but worsened 3/18. Decrease torsemide to 10mg . Monitor weekly. Discussed with patient. Weight decreased, decrease torsemide to 10mg , then increased to 20mg  QD.   -4/2 Cr/BUN stable at 1.19/13 yesterday, continue current regimen for now  12. Hyperlipidemia: continue lipitor 80mg  QHS  13. Decreased initiation: increase amantadine to  200mg  daily, continue, appears to be helping, discussed improvements with therapy 14. Vitamin D insufficiency: 21.41 on 10/04/22, continue ergocalciferol 50,000U once per week for 7 weeks. Daily supplement d/ced 15. Bilateral lower extremity throbbing: -10/09/22 SCDs need adjusting, asked nursing to assist; added Robaxin 500mg  q6h PRN for leg spasms -[place] reviewed and negative for clots, asking nursing to check whether SCDs are working, continue SCDs, asked nursing to apply them -Discussed baclofen but she defers due to side effect of constipation.  4/4 denied pain today. No pain with PROM of left side 16. Constipation:   -10/08/22 increased miralax 17g BID, add colace 100mg  QD  -10/18/22 senokot 2 tabs QD  added, plus 2 tabs QD PRN if no BM in 24hrs -Magnesium citrate ordered 3/27 with good effect. Decrease Miralax to daily.  -10/22/22 pt reports LBM yesterday but not documented since 10/19/22; monitor for now, but may need to go back up on Miralax -10/23/22 LBM yesterday, cont regimen  -4/4  pt reports bm yesterday, yet none recorded. Obsv today -continue miralax 17. Increased tone  -4/3 consider baclofen. For now splinting, rom    LOS: 26 days A FACE TO [place] 10/27/2022, 9:09 AM

## 2022-10-27 NOTE — Progress Notes (Signed)
Speech Language Pathology Daily Session Note  Patient Details  Name: ELLEIGH RAJU MRN: [id number] Date of Birth: 1976-07-25  Today's Date: 10/27/2022 SLP Individual Time: [id number] SLP Individual Time Calculation (min): 26 min  Short Term Goals: Week 4: SLP Short Term Goal 1 (Week 4): STGs=LTGs due to ELOS  Skilled Therapeutic Interventions: Skilled treatment session focused on dysphagia goals and family education. SLP facilitated session by providing skilled observation with thin liquids via straw. Patient consumed single and consecutive sips without overt s/s of aspiration. Patient reports she has been consuming thin liquids via straw for the last week without difficulty, therefore, recommend patient upgrade to thin liquids via cup or straw. SLP provided education regarding patient's current swallowing function, diet recommendations, appropriate textures and compensatory strategies. Education also provided regarding patient's current cognitive functioning and strategies to utilize attention, memory, and overall safety at home. All verbalized understanding and handouts were given to reinforce information. Patient left upright in wheelchair with family present. Continue with current plan of care.      Pain No/Denies Pain   Therapy/Group: Individual Therapy  Sarenity Ramaker 10/27/2022, 3:03 PM

## 2022-10-27 NOTE — Progress Notes (Addendum)
Physical Therapy Discharge Summary  Patient Details  Name: [name] MRN: [id number] Date of Birth: 26-Nov-1975  Date of Discharge from PT service:October 30, 2022  Today's Date: 10/30/2022 PT Individual Time: 1015-1128 PT Individual Time Calculation (min): 73 min    Patient has met 9 of 9 long term goals due to improved activity tolerance, improved balance, improved postural control, increased strength, increased range of motion, ability to compensate for deficits, functional use of  left lower extremity, improved attention, and improved awareness.  Patient to discharge at a wheelchair level Min/Mod Assist.   Patient's care partner is independent to provide the necessary physical and cognitive assistance at discharge. Patient's family completed hands on training and education on 10/27/22 and demonstrated adequate ability to safely manage patient at a wheelchair level. Recommended strict wheelchair level for safety and to avoid stair navigation and ambulation until further therapy due to high falls risk.    Recommendation:  Patient will benefit from ongoing skilled PT services in home health setting to continue to advance safe functional mobility, address ongoing impairments in caregiver training, home safety, functional transfers, home management LLE NMR, and minimize fall risk.  Equipment: Custom wheelchair through [employer], Hospital Bed  Reasons for discharge: treatment goals met and discharge from hospital  Patient/family agrees with progress made and goals achieved: Yes  PT Discharge Precautions/Restrictions Precautions Precautions: Fall Precaution Comments: Dense L hemi (UE > LE) Restrictions Weight Bearing Restrictions: No   Pain Interference Pain Interference Pain Effect on Sleep: 3. Frequently Pain Interference with Therapy Activities: 1. Rarely or not at all Pain Interference with Day-to-Day Activities: 1. Rarely or not at all Vision/Perception  Vision -  History Ability to See in Adequate Light: 1 Impaired Perception Perception: Impaired Inattention/Neglect: Does not attend to left side of body Praxis Praxis: Impaired Praxis Impairment Details: Initiation;Motor planning  Cognition Overall Cognitive Status: Impaired/Different from baseline Arousal/Alertness: Awake/alert Orientation Level: Oriented X4 Attention: Sustained;Selective;Alternating Focused Attention: Appears intact Sustained Attention: Appears intact Selective Attention: Impaired Selective Attention Impairment: Verbal basic;Functional basic Memory: Impaired Memory Impairment: Retrieval deficit;Decreased recall of new information;Decreased short term memory Decreased Short Term Memory: Verbal basic;Functional basic Awareness: Impaired Awareness Impairment: Emergent impairment Problem Solving: Impaired Problem Solving Impairment: Verbal basic;Functional basic Self Correcting: Impaired Self Correcting Impairment: Verbal basic;Functional basic Behaviors: Poor frustration tolerance;Other (comment) (flat affect) Safety/Judgment: Impaired Comments: Decreased insight in to full limitations Sensation Sensation Light Touch: Impaired by gross assessment Peripheral sensation comments: R LE light touch grossly intact, L LE light touch impaired, pt unable to detect light touch of finger on any dermatome Light Touch Impaired Details: Impaired LUE;Impaired LLE Hot/Cold: Not tested Proprioception: Impaired Detail (L LE proprioception impaired) Stereognosis: Not tested Coordination Fine Motor Movements are Fluid and Coordinated: No Coordination and Movement Description: Impaired due to L hemiparesis Finger Nose Finger Test: NT due to L LE hemiparesis Heel Shin Test: NT due to L LE hemiparesis Motor  Motor Motor: Hemiplegia;Abnormal tone;Motor apraxia;Abnormal postural alignment and control Motor - Discharge Observations: Dense L hemi ( UE > LE)  Mobility Bed Mobility Bed  Mobility: Rolling Right;Rolling Left;Supine to Sit;Sit to Supine Rolling Right: Minimal Assistance - Patient > 75% Rolling Left: Minimal Assistance - Patient > 75% Supine to Sit: Moderate Assistance - Patient 50-74% Sit to Supine: Moderate Assistance - Patient 50-74% Transfers Transfers: Sit to Stand;Stand to Sit;Stand Pivot Transfers;Squat Pivot Transfers Sit to Stand: Minimal Assistance - Patient > 75% Stand to Sit: Minimal Assistance - Patient > 75% Stand Pivot Transfers: Minimal  Assistance - Patient > 75% Stand Pivot Transfer Details: Tactile cues for initiation;Tactile cues for sequencing;Tactile cues for weight shifting;Tactile cues for posture;Verbal cues for technique;Verbal cues for precautions/safety;Verbal cues for gait pattern;Verbal cues for safe use of DME/AE;Manual facilitation for weight shifting;Manual facilitation for placement Squat Pivot Transfers: Minimal Assistance - Patient > 75% Transfer (Assistive device): 1 person hand held assist Locomotion  Gait Ambulation: Yes Gait Assistance: Maximal Assistance - Patient 25-49% Gait Distance (Feet): 50 Feet Assistive device: Eva walker;Other (Comment) (2 pperson assist) Gait Assistance Details: Tactile cues for initiation;Tactile cues for sequencing;Tactile cues for weight shifting;Tactile cues for posture;Tactile cues for placement;Tactile cues for weight beaing;Manual facilitation for placement;Manual facilitation for weight shifting;Verbal cues for safe use of DME/AE;Verbal cues for gait pattern;Verbal cues for precautions/safety;Verbal cues for technique;Verbal cues for sequencing;Manual facilitation for weight bearing;Visual cues for safe use of DME/AE Gait Gait: Yes Gait Pattern: Impaired Gait Pattern: Step-to pattern;Decreased step length - left;Decreased step length - right;Decreased stance time - left;Decreased hip/knee flexion - left;Decreased dorsiflexion - left;Decreased weight shift to left;Left flexed knee in  stance;Lateral trunk lean to left;Trunk flexed;Narrow base of support;Poor foot clearance - left Stairs / Additional Locomotion Stairs: No Wheelchair Mobility Wheelchair Mobility: Yes Wheelchair Assistance: Maximal Assistance - Patient 25 - 49% Wheelchair Propulsion: Right lower extremity;Right upper extremity Wheelchair Parts Management: Needs assistance Distance: 150'  Trunk/Postural Assessment  Cervical Assessment Cervical Assessment: Exceptions to [facility] (forward head) Thoracic Assessment Thoracic Assessment: Exceptions to [facility] (rounded shoulders) Lumbar Assessment Lumbar Assessment: Exceptions to [facility] (posteriorly tilted) Postural Control Postural Control: Deficits on evaluation Trunk Control: Poor - trunk lean L and posterior Righting Reactions: delayed and insufficient  Balance Balance Balance Assessed: Yes Static Sitting Balance Static Sitting - Balance Support: Right upper extremity supported;Feet supported Static Sitting - Level of Assistance: 6: Modified independent (Device/Increase time) Dynamic Sitting Balance Dynamic Sitting - Balance Support: Feet supported;Right upper extremity supported Dynamic Sitting - Level of Assistance: 5: Stand by assistance (CGA) Dynamic Sitting - Balance Activities: Reaching across midline Sitting balance - Comments: mild left lean in sitting Extremity Assessment  RLE Assessment RLE Assessment: Exceptions to [facility] General Strength Comments: 3-/5 grossly with MMT EOM LLE Assessment LLE Assessment: Exceptions to [facility] General Strength Comments: 0/5 throughout   Today's Interventions Pt supine in bed upon arrival. Pt agitated as she didn't realize she had therapy today but agreeable to therapy. Therapist assisted pt to put on pants while in long sitting. Pt performed supine with HOB to sitting EOB with mod A for L UE and LE.   Pt performed sit<>stand with min A with R UE support and therapist stabilizing L LE to reduce buckling. Pt stood pulled  up pants over buttocks with min A while therapist stabilized L knee.  Pt performed squat pivot transfer bed to WC to R with min A. Therapist transported pt dependent in WC to day room. Pt performed squat pivot transfer to R with min A, verbal cues provided for hand placement.   Pt reviewed and performed 1x10 of HEP listed below on R and L LE. Therapist encouraged pt to try to do as much as she could with L LE and therapist performed rest of the motion. Eduction provided on importance of attempting to perform each time with L LE to improve the brains communication to the L LE. Pt verbalized understanding. For bridges, pt stabilized L LE as pt unable, therapist placed L LE closer to body than R LE to increase activation of L LE. Verbal  cuing provided for full ROM, and breathing throughout exercise. Handout provided for HEP. Pt verbalized understanding. Pt denied any concerns upon discharging home tomorrow.   Access Code: [email or web address] URL: [email or web address] Date: 10/30/2022 Prepared by: Ambrose Finland  Exercises - Supine Bridge  - 1 x daily - 7 x weekly - 3 sets - 10 reps - Bent Knee Fallouts  - 1 x daily - 7 x weekly - 3 sets - 10 reps - Seated Long Arc Quad  - 1 x daily - 7 x weekly - 3 sets - 10 reps - Seated March  - 1 x daily - 7 x weekly - 3 sets - 10 reps - Supine Hip Abduction  - 1 x daily - 7 x weekly - 3 sets - 10 reps - Supine Gluteal Sets  - 1 x daily - 7 x weekly - 3 sets - 10 reps - Supine Heel Slide  - 1 x daily - 7 x weekly - 3 sets - 10 reps  Pt requested to go back to bed.   Pt performed stand pivot transfer with min A  WC to bed to L verbal and tactile cues provided for sequencing and weight shifting. Pt performed sitting EOB <>supine with mod A for LE and aligning trunk. Pt supine in bed with HOB elevated at end of session with all needs within reach and bed alarm on.   Christian Iona Beard PT, DPT Mertha Finders PT, DPT  Ambrose Finland, PT,  DPT  10/27/2022, 2:17 PM

## 2022-10-28 LAB — GLUCOSE, CAPILLARY
Glucose-Capillary: 118 mg/dL — ABNORMAL HIGH (ref 70–99)
Glucose-Capillary: 201 mg/dL — ABNORMAL HIGH (ref 70–99)
Glucose-Capillary: 153 mg/dL — ABNORMAL HIGH (ref 70–99)
Glucose-Capillary: 231 mg/dL — ABNORMAL HIGH (ref 70–99)

## 2022-10-28 MED ORDER — SORBITOL 70 % SOLN
30.0000 mL | Status: AC
  Administered 2022-10-28: 30 mL via ORAL
  Filled 2022-10-28: qty 30

## 2022-10-28 MED ORDER — MEGESTROL ACETATE 400 MG/10ML PO SUSP
400.0000 mg | Freq: Two times a day (BID) | ORAL | Status: DC
  Administered 2022-10-28 – 2022-11-01 (×7): 400 mg via ORAL
  Filled 2022-10-28 (×9): qty 10

## 2022-10-28 NOTE — Progress Notes (Signed)
PROGRESS NOTE   Subjective/Complaints:  No new issues this morning. Still no appetite. Still has pain in left leg.   ROS: Limited due to cognitive/behavioral    Objective:   No results found. No results for input(s): "WBC", "HGB", "HCT", "PLT" in the last 72 hours.     No results for input(s): "NA", "K", "CL", "CO2", "GLUCOSE", "BUN", "CREATININE", "CALCIUM" in the last 72 hours.      Intake/Output Summary (Last 24 hours) at 10/28/2022 0921 Last data filed at 10/28/2022 0809 Gross per 24 hour  Intake 600 ml  Output 250 ml  Net 350 ml        Physical Exam: Vital Signs Blood pressure 121/84, pulse 62, temperature 99 F (37.2 C), temperature source Oral, resp. rate 16, height 4\' 11"  (1.499 m), weight 64 kg, last menstrual period 06/22/2022, SpO2 100 %.  Constitutional: No distress . Vital signs reviewed. HEENT: NCAT, EOMI, oral membranes moist Neck: supple Cardiovascular: RRR without murmur. No JVD    Respiratory/Chest: CTA Bilaterally without wheezes or rales. Normal effort    GI/Abdomen: BS +, non-tender, non-distended Ext: no clubbing, cyanosis, or edema Psych: flat but cooperative  Skin: dry, warm MSK: LLE was painful along lower leg when I passively ranged it.  Normal PROM Neurologic: Alert, makes eye contact. Left central 7.  Speech dysarthric, low volume.  Minimal verbal output.  Language appears to be intact. Left inattention. MILDLY increased tone LUE (flexor at elbow) and LLE---but generally easy ROM. LUE and LLE 0/5 with decreased sensation to LT/pain on left.      Assessment/Plan: 1. Functional deficits which require 3+ hours per day of interdisciplinary therapy in a comprehensive inpatient rehab setting. Physiatrist is providing close team supervision and 24 hour management of active medical problems listed below. Physiatrist and rehab team continue to assess barriers to discharge/monitor patient  progress toward functional and medical goals  Care Tool:  Bathing    Body parts bathed by patient: Left arm, Chest, Abdomen, Front perineal area, Right upper leg, Left upper leg, Face, Right lower leg, Left lower leg, Buttocks   Body parts bathed by helper: Right arm     Bathing assist Assist Level: Minimal Assistance - Patient > 75%     Upper Body Dressing/Undressing Upper body dressing   What is the patient wearing?: Pull over shirt    Upper body assist Assist Level: Minimal Assistance - Patient > 75%    Lower Body Dressing/Undressing Lower body dressing      What is the patient wearing?: Incontinence brief, Pants     Lower body assist Assist for lower body dressing: Moderate Assistance - Patient 50 - 74%     Toileting Toileting Toileting Activity did not occur Press photographer and hygiene only): N/A (no void or bm)  Toileting assist Assist for toileting: Minimal Assistance - Patient > 75%     Transfers Chair/bed transfer  Transfers assist  Chair/bed transfer activity did not occur: Safety/medical concerns  Chair/bed transfer assist level: Minimal Assistance - Patient > 75%     Locomotion Ambulation   Ambulation assist   Ambulation activity did not occur: Safety/medical concerns  Walk 10 feet activity   Assist  Walk 10 feet activity did not occur: Safety/medical concerns        Walk 50 feet activity   Assist Walk 50 feet with 2 turns activity did not occur: Safety/medical concerns         Walk 150 feet activity   Assist Walk 150 feet activity did not occur: Safety/medical concerns         Walk 10 feet on uneven surface  activity   Assist Walk 10 feet on uneven surfaces activity did not occur: Safety/medical concerns         Wheelchair     Assist Is the patient using a wheelchair?: Yes Type of Wheelchair: Manual Wheelchair activity did not occur: Safety/medical concerns         Wheelchair 50 feet  with 2 turns activity    Assist    Wheelchair 50 feet with 2 turns activity did not occur: Safety/medical concerns       Wheelchair 150 feet activity     Assist  Wheelchair 150 feet activity did not occur: Safety/medical concerns       Blood pressure 121/84, pulse 62, temperature 99 F (37.2 C), temperature source Oral, resp. rate 16, height 4\' 11"  (1.499 m), weight 64 kg, last menstrual period 06/22/2022, SpO2 100 %.    Medical Problem List and Plan: 1. 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  Antithrombotics: -DVT/anticoagulation:  Pharmaceutical: Eliquis 5mg  BID             -antiplatelet therapy: N/A 3. Back pain/Pain Management:  Continue magnesium 250mg  HS (>>500mg  QHS). Tylenol prn. Decrease MSIR 15mg  to q8H prn  4. Chronic anxiety, Insomnia: Resumed home atarax 10mg  TID PRN             -continue lexapro 5mg  qhs             -neuropsych eval -10/02/22 started Trazodone 25mg  QHS and Hydroxyzine 25mg  QHS for now, monitor for effect or oversedation; left Hydroxyzine 10mg  TID PRN dosing for anxiety in place  -10/08/22 added Melatonin 5mg  QHS  -4/3 increased lexapro to 10mg  for decreased mood/anxiety -4/5 I suspect pt's affect is partially due to right MCA infarct  5. Neuropsych/cognition: This patient is not capable of making decisions on her own behalf.  -on amantadine for initiation/arousal 6. Skin/Wound Care:  Routine pressure relief  measures.  7. Fluids/Electrolytes/Nutrition: intake is poor.  -4/5 will begin trial of megace 8. Acute on chronic combined CHF: HH diet.  -Monitor weights daily and for signs of overload -continue Torsemide 40mg  QD (>>20mg  QD), Losartan 50mg  QD, Spironolactone 25mg  QD, Jardiance 10mg  QD (>>25mg  QD), and Metoprolol  25mg  BID  --Increase torsemide back to 20mg  daily -4/2 no signs of overload, weight a little up continue to monitor, continue current regimen -4/5 weight has steadily fallen from 3/9 (86kg) to today (64kg)--unfortunately this is also due to poor nutrition (see #7) Filed Weights   10/26/22 0519 10/27/22 0445 10/28/22 0401  Weight: 65.8 kg 68.2 kg 64 kg   Vitals:   10/24/22 1948 10/25/22 0542 10/25/22 1509  10/25/22 1936  BP: 121/83 (!) 129/94 125/87 111/79   10/26/22 0519 10/26/22 1317 10/26/22 1811 10/26/22 1939  BP: 107/66 (!) 142/132 116/86 (!) 117/91   10/27/22 0445 10/27/22 1253 10/27/22 1943 10/28/22 0401  BP: 111/81 120/87 110/71 121/84    9. T2DM: Hgb A1C-10.8  (improving >15 @ 11/23). Was on Lantus 15u w/ novolog 12 units with supper>>not restarted             --monitor BS ac/hs and use SSI for elevated BS             --on SSI, Jardiance 10mg  QD (>>25mg  QD) -10/02/22 CBGs well controlled with 2-3U of novolog use per meal so far, monitor for trends  -3/13 increase Jardiance to 25mg   -d/c ISS, discussed good blood sugar control with patient  -Add magnesium gluconate 250mg  HS>> increased to 500mg  QHS  -4/2 Add glipizide 2.5mg  for improved control  -4/5-increased glipizide to 5mg  daily on 4/4   -no further changes until PO picks up  CBG (last 3)  Recent Labs    10/27/22 1632 10/27/22 2111 10/28/22 0636  GLUCAP 186* 133* 118*    10. Severe obesity: Educate patient on diet, compliance and exercise to help promote health and mobility. Add magnesium gluconate 250mg  HS, increased to 500mg  QHS 11. Acute on Chronic renal failure/AKI:   -4/2 Cr/BUN stable at 1.19/13 yesterday, continue current regimen for now  12. Hyperlipidemia: continue lipitor 80mg  QHS  13. Decreased initiation: increase amantadine to 200mg  daily, continue, appears to be helping, discussed improvements with therapy 14. Vitamin D insufficiency: 21.41 on 10/04/22, continue ergocalciferol 50,000U once per week for  7 weeks. Daily supplement d/ced 15. Bilateral lower extremity throbbing: -10/09/22 SCDs need adjusting, asked nursing to assist; added Robaxin 500mg  q6h PRN for leg spasms -[place] reviewed and negative for clots, asking nursing to check whether SCDs are working, continue SCDs, asked nursing to apply them -Discussed baclofen but she defers due to side effect of constipation.  4/5 pain more in LLE today. Continue tylenol, robaxin, prn ms ir 16. Constipation:   -10/08/22 increased miralax 17g BID, add colace 100mg  QD  -10/18/22 senokot 2 tabs QD  added, plus 2 tabs QD PRN if no BM in 24hrs -Magnesium citrate ordered 3/27 with good effect. Decrease Miralax to daily.  -10/22/22 pt reports LBM yesterday but not documented since 10/19/22; monitor for now, but may need to go back up on Miralax -10/23/22 LBM yesterday, cont regimen  -4/5 still no bm since 3/29---sorbitol today -needs to eat more as well 17. Increased tone  -4/3 consider baclofen. For now splinting, rom    LOS: 27 days A FACE TO FACE EVALUATION WAS PERFORMED  Ranelle Oyster 10/28/2022, 9:21 AM

## 2022-10-28 NOTE — Progress Notes (Signed)
Physical Therapy Session Note  Patient Details  Name: [name] MRN: [id number] Date of Birth: 01-Aug-1975  Today's Date: 10/28/2022   Pt missed 60 min of skilled PT due to nausea and refusal to participate. Pt states she has been nauseous and vomitting and "just wants to sleep." Offered various bed level activities and problem solving/education in regards to d/c planning and home transition, but pt still declined. Reminded patient that this is her last full day of scheduled therapies with planned d/c for Monday and less therapies over the weekend - verbalized understanding. RN made aware of missed time.    General: PT Amount of Missed Time (min): 60 Minutes PT Missed Treatment Reason: Patient ill (Comment);Patient unwilling to participate   Karolee Stamps Darrol Poke, PT, DPT, CBIS  10/28/2022, 2:43 PM

## 2022-10-28 NOTE — Progress Notes (Signed)
Patient ID: [name], female   DOB: 1976/05/14, 47 y.o.   MRN: [id number]  Family was here yesterday for hands on education-multiple people were here-two daughter's, Dad and pt's boyfriend. All asked questions and are aware of pt's care needs. Daughter whom she is going home with did a lot of the actual hands on care. Agreeable to equipment and have made referral to [employer] for hospital bed, drop-arm bedside commode and tub seat. Wheelchair is coming from [employer]. Discussed home health preference and pt had none. Will make home health referral and work toward her discharge Monday. Peer support arranged to talk with her, hopefully it will be helpful to pt.

## 2022-10-28 NOTE — Progress Notes (Signed)
Occupational Therapy Session Note  Patient Details  Name: [name] MRN: [id number] Date of Birth: [birth year]  Today's Date: 10/28/2022 OT Individual Time: 1050-1200 OT Individual Time Calculation (min): 70 min and 15 min OT Co-treatment Time:  2671-2458       Short Term Goals: Week 4:  OT Short Term Goal 1 (Week 4): STG= LTG due to LOS  Skilled Therapeutic Interventions/Progress Updates:    1st Session:  Patient seen in conjunction with PT for wheelchair assessment.  Recommended swing away, elevating arm rests, with half lap tray.  Discussed options for cushion, seating, height for self propelling and slight dump of seat.    2nd Session:  Patient seated in wheelchair upon arrival.  Agreeable to OT to get washed and dressed.  Transported to sink. Long discussion about family training yesterday, and patient reports feelings of irritation and frustration that family was arguing.  Discussed that family may be nervous in anticipation of her coming home.  Discussion regarding patient taking ownership of her own care - starting to direct others as to her needs, etc.  Patient in agreement in this moment.  Patient bathed and dressed with mod/min assist.  Patient left up in wheelchair with call bell and personal items in reach.     Therapy Documentation Precautions:  Precautions Precautions: Fall Precaution Comments: Dense L hemi (UE > LE) Required Braces or Orthoses: Other Brace Other Brace: L Resting hand splint and L PRAFO Restrictions Weight Bearing Restrictions: No    Pain:  Reports discomfort in LLE with passive range..  Reports abdominal pain - feels constipated.       Therapy/Group: Individual Therapy and Co-Treatment  Collier Salina 10/28/2022, 1:12 PM

## 2022-10-28 NOTE — Progress Notes (Signed)
Physical Therapy Session Note  Patient Details  Name: [name] MRN: [id number] Date of Birth: 1976/03/17  Today's Date: 10/28/2022 PT Individual Time: 0840-0940 PT Individual Time Calculation (min): 60 min   Short Term Goals: Week 1:  PT Short Term Goal 1 (Week 1): Pt will roll side to side w/ min A. PT Short Term Goal 1 - Progress (Week 1): Progressing toward goal PT Short Term Goal 2 (Week 1): Pt will transfer sup to sit w/ mod A PT Short Term Goal 2 - Progress (Week 1): Progressing toward goal PT Short Term Goal 3 (Week 1): Pt will maintain sitting balance x 5' w/ min A PT Short Term Goal 3 - Progress (Week 1): Met PT Short Term Goal 4 (Week 1): PT to assess gait. PT Short Term Goal 4 - Progress (Week 1): Not met  Skilled Therapeutic Interventions/Progress Updates:  Patient supine in bed on entrance to room. RN present and providing morning medications. Patient alert and agreeable to PT session. Introduced to representative from [employer] for w/c evaluation.   Patient with no pain complaint at start of session.  Therapeutic Activity: Bed Mobility: Pt performed supine --> sit with ***. VC/ tc required for ***. Transfers: Pt performed sit<>stand and stand pivot transfers throughout session with ***. Provided verbal cues for***.  Gait Training:  Pt ambulated *** ft using *** with ***. Demonstrated ***. Provided vc/ tc for ***.  Wheelchair Mobility:  Pt propelled wheelchair *** feet with ***. Provided vc/ tc for ***.  Neuromuscular Re-ed: NMR facilitated during session with focus on***. Pt guided in ***. NMR performed for improvements in motor control and coordination, balance, sequencing, judgement, and self confidence/ efficacy in performing all aspects of mobility at highest level of independence.   Therapeutic Exercise: Pt performed the following exercises with vc/ tc for proper technique. ***  Patient *** at end of session with brakes locked, *** alarm set, and all  needs within reach.   Therapy Documentation Precautions:  Precautions Precautions: Fall Precaution Comments: Dense L hemi (UE > LE) Required Braces or Orthoses: Other Brace Other Brace: L Resting hand splint and L PRAFO Restrictions Weight Bearing Restrictions: No General:   Vital Signs:   Pain: Pain Assessment Pain Scale: 0-10 Pain Score: 0-No pain Mobility:   Locomotion :    Trunk/Postural Assessment :    Balance:   Exercises:   Other Treatments:      Therapy/Group: Individual Therapy  Loel Dubonnet PT, DPT, CSRS 10/28/2022, 10:58 AM

## 2022-10-29 LAB — GLUCOSE, CAPILLARY
Glucose-Capillary: 172 mg/dL — ABNORMAL HIGH (ref 70–99)
Glucose-Capillary: 173 mg/dL — ABNORMAL HIGH (ref 70–99)
Glucose-Capillary: 173 mg/dL — ABNORMAL HIGH (ref 70–99)
Glucose-Capillary: 185 mg/dL — ABNORMAL HIGH (ref 70–99)

## 2022-10-29 MED ORDER — DOCUSATE SODIUM 100 MG PO CAPS
100.0000 mg | ORAL_CAPSULE | Freq: Every day | ORAL | Status: DC
  Administered 2022-10-29 – 2022-11-01 (×4): 100 mg via ORAL
  Filled 2022-10-29 (×4): qty 1

## 2022-10-29 MED ORDER — SORBITOL 70 % SOLN
30.0000 mL | Freq: Once | Status: AC
  Administered 2022-10-29: 30 mL via ORAL
  Filled 2022-10-29: qty 30

## 2022-10-29 MED ORDER — POLYETHYLENE GLYCOL 3350 17 G PO PACK
17.0000 g | PACK | Freq: Two times a day (BID) | ORAL | Status: DC
  Administered 2022-10-29: 17 g via ORAL
  Filled 2022-10-29 (×6): qty 1

## 2022-10-29 NOTE — Progress Notes (Signed)
PROGRESS NOTE   Subjective/Complaints:   Pt reports "fine" no issues, but LBM 2-3 days ago- feels constipated.   Has intermittent hiccups.   GYF:VCBSWHQ due to cognition  Objective:   No results found. No results for input(s): "WBC", "HGB", "HCT", "PLT" in the last 72 hours.     No results for input(s): "NA", "K", "CL", "CO2", "GLUCOSE", "BUN", "CREATININE", "CALCIUM" in the last 72 hours.      Intake/Output Summary (Last 24 hours) at 10/29/2022 1046 Last data filed at 10/29/2022 1012 Gross per 24 hour  Intake 360 ml  Output 125 ml  Net 235 ml        Physical Exam: Vital Signs Blood pressure 94/62, pulse 62, temperature 98.4 F (36.9 C), resp. rate 15, height 4\' 11"  (1.499 m), weight 66.3 kg, last menstrual period 06/22/2022, SpO2 100 %.    General: awake, alert, appropriate, sitting up in bed;  NAD HENT: conjugate gaze; oropharynx moist CV: regular rate; no JVD Pulmonary: CTA B/L; no W/R/R- good air movement GI: soft, NT, ND, (+)BS Psychiatric: appropriate- flat Neurological: alert Ext: no clubbing, cyanosis, or edema Psych: flat but cooperative  Skin: dry, warm MSK: LLE was painful along lower leg when I passively ranged it.  Normal PROM Neurologic: Alert, makes eye contact. Left central 7.  Speech dysarthric, low volume.  Minimal verbal output.  Language appears to be intact. Left inattention. MILDLY increased tone LUE (flexor at elbow) and LLE---but generally easy ROM. LUE and LLE 0/5 with decreased sensation to LT/pain on left.      Assessment/Plan: 1. Functional deficits which require 3+ hours per day of interdisciplinary therapy in a comprehensive inpatient rehab setting. Physiatrist is providing close team supervision and 24 hour management of active medical problems listed below. Physiatrist and rehab team continue to assess barriers to discharge/monitor patient progress toward functional and  medical goals  Care Tool:  Bathing    Body parts bathed by patient: Left arm, Chest, Abdomen, Front perineal area, Right upper leg, Left upper leg, Face, Right lower leg, Left lower leg, Buttocks   Body parts bathed by helper: Right arm     Bathing assist Assist Level: Minimal Assistance - Patient > 75%     Upper Body Dressing/Undressing Upper body dressing   What is the patient wearing?: Pull over shirt    Upper body assist Assist Level: Minimal Assistance - Patient > 75%    Lower Body Dressing/Undressing Lower body dressing      What is the patient wearing?: Incontinence brief, Pants     Lower body assist Assist for lower body dressing: Moderate Assistance - Patient 50 - 74%     Toileting Toileting Toileting Activity did not occur Press photographer and hygiene only): N/A (no void or bm)  Toileting assist Assist for toileting: Minimal Assistance - Patient > 75%     Transfers Chair/bed transfer  Transfers assist  Chair/bed transfer activity did not occur: Safety/medical concerns  Chair/bed transfer assist level: Minimal Assistance - Patient > 75%     Locomotion Ambulation   Ambulation assist   Ambulation activity did not occur: Safety/medical concerns          Walk  10 feet activity   Assist  Walk 10 feet activity did not occur: Safety/medical concerns        Walk 50 feet activity   Assist Walk 50 feet with 2 turns activity did not occur: Safety/medical concerns         Walk 150 feet activity   Assist Walk 150 feet activity did not occur: Safety/medical concerns         Walk 10 feet on uneven surface  activity   Assist Walk 10 feet on uneven surfaces activity did not occur: Safety/medical concerns         Wheelchair     Assist Is the patient using a wheelchair?: Yes Type of Wheelchair: Manual Wheelchair activity did not occur: Safety/medical concerns         Wheelchair 50 feet with 2 turns  activity    Assist    Wheelchair 50 feet with 2 turns activity did not occur: Safety/medical concerns       Wheelchair 150 feet activity     Assist  Wheelchair 150 feet activity did not occur: Safety/medical concerns       Blood pressure 94/62, pulse 62, temperature 98.4 F (36.9 C), resp. rate 15, height 4\' 11"  (1.499 m), weight 66.3 kg, last menstrual period 06/22/2022, SpO2 100 %.    Medical Problem List and Plan: 1. 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  Antithrombotics: -DVT/anticoagulation:  Pharmaceutical: Eliquis 5mg  BID             -antiplatelet therapy: N/A 3. Back pain/Pain Management:  Continue magnesium 250mg  HS (>>500mg  QHS). Tylenol prn. Decrease MSIR 15mg  to q8H prn  4. Chronic anxiety, Insomnia: Resumed home atarax 10mg  TID PRN             -continue lexapro 5mg  qhs             -neuropsych eval -10/02/22 started Trazodone 25mg  QHS and Hydroxyzine 25mg  QHS for now, monitor for effect or oversedation; left Hydroxyzine 10mg  TID PRN dosing for anxiety in place  -10/08/22 added Melatonin 5mg  QHS  -4/3 increased lexapro to 10mg  for decreased mood/anxiety -4/5 I suspect pt's affect is partially due to right MCA infarct  5. Neuropsych/cognition: This patient is not capable of making decisions on her own behalf.  -on amantadine for initiation/arousal 6. Skin/Wound Care:  Routine pressure relief  measures.  7. Fluids/Electrolytes/Nutrition: intake is poor.  -4/5 will begin trial of megace 8. Acute on chronic combined CHF: HH diet.  -Monitor weights daily and for signs of overload -continue Torsemide 40mg  QD (>>20mg  QD), Losartan 50mg  QD, Spironolactone 25mg  QD, Jardiance 10mg  QD (>>25mg  QD), and Metoprolol 25mg  BID  --Increase torsemide back to 20mg  daily -4/2 no  signs of overload, weight a little up continue to monitor, continue current regimen -4/5 weight has steadily fallen from 3/9 (86kg) to today (64kg)--unfortunately this is also due to poor nutrition (see #7) Filed Weights   10/27/22 0445 10/28/22 0401 10/29/22 0524  Weight: 68.2 kg 64 kg 66.3 kg   Vitals:   10/26/22 1317 10/26/22 1811 10/26/22 1939 10/27/22 0445  BP: (!) 142/132 116/86 (!) 117/91  111/81   10/27/22 1253 10/27/22 1943 10/28/22 0401 10/28/22 1341  BP: 120/87 110/71 121/84 106/73   10/28/22 1927 10/29/22 0524 10/29/22 0834 10/29/22 0835  BP: 125/82 108/70 94/62 94/62     9. T2DM: Hgb A1C-10.8  (improving >15 @ 11/23). Was on Lantus 15u w/ novolog 12 units with supper>>not restarted             --monitor BS ac/hs and use SSI for elevated BS             --on SSI, Jardiance 10mg  QD (>>25mg  QD) -10/02/22 CBGs well controlled with 2-3U of novolog use per meal so far, monitor for trends  -3/13 increase Jardiance to 25mg   -d/c ISS, discussed good blood sugar control with patient  -Add magnesium gluconate 250mg  HS>> increased to 500mg  QHS  -4/2 Add glipizide 2.5mg  for improved control  -4/5-increased glipizide to 5mg  daily on 4/4   -no further changes until PO picks up  4/6- Cbgs looks ok except 1 value- con't regimen CBG (last 3)  Recent Labs    10/28/22 1630 10/28/22 2051 10/29/22 0618  GLUCAP 153* 231* 172*    10. Severe obesity: Educate patient on diet, compliance and exercise to help promote health and mobility. Add magnesium gluconate 250mg  HS, increased to 500mg  QHS 11. Acute on Chronic renal failure/AKI:   -4/2 Cr/BUN stable at 1.19/13 yesterday, continue current regimen for now  12. Hyperlipidemia: continue lipitor 80mg  QHS  13. Decreased initiation: increase amantadine to 200mg  daily, continue, appears to be helping, discussed improvements with therapy 14. Vitamin D insufficiency: 21.41 on 10/04/22, continue ergocalciferol 50,000U once per week for 7 weeks. Daily  supplement d/ced 15. Bilateral lower extremity throbbing: -10/09/22 SCDs need adjusting, asked nursing to assist; added Robaxin 500mg  q6h PRN for leg spasms -[place] reviewed and negative for clots, asking nursing to check whether SCDs are working, continue SCDs, asked nursing to apply them -Discussed baclofen but she defers due to side effect of constipation.  4/5 pain more in LLE today. Continue tylenol, robaxin, prn ms ir 16. Constipation:   -10/08/22 increased miralax 17g BID, add colace 100mg  QD  -10/18/22 senokot 2 tabs QD  added, plus 2 tabs QD PRN if no BM in 24hrs -Magnesium citrate ordered 3/27 with good effect. Decrease Miralax to daily.  -10/22/22 pt reports LBM yesterday but not documented since 10/19/22; monitor for now, but may need to go back up on Miralax -10/23/22 LBM yesterday, cont regimen  -4/5 still no bm since 3/29---sorbitol today -needs to eat more as well 4/6- will give Sorbitol 30 cc after therapy- LBM 2-3 days ago and feels constipated 17. Increased tone  -4/3 consider baclofen. For now splinting, rom    LOS: 28 days A FACE TO FACE EVALUATION WAS PERFORMED  [name] 10/29/2022, 10:46 AM

## 2022-10-29 NOTE — Plan of Care (Signed)
Problem: Consults Goal: RH STROKE PATIENT EDUCATION Description: See Patient Education module for education specifics  Outcome: Progressing   Problem: RH BOWEL ELIMINATION Goal: RH STG MANAGE BOWEL WITH ASSISTANCE Description: STG Manage Bowel with min Assistance. Outcome: Not Progressing Note: Patient currently on toileting every 3 hours. Patient encouraged to get out of bed and use bedside commode but patient stated that she would rather use female urinal instead of getting out out of bed.  Goal: RH STG MANAGE BOWEL W/MEDICATION W/ASSISTANCE Description: STG Manage Bowel with Medication with mod I Assistance. Outcome: Not Progressing Note: Patient is greater than 3 days without bowel movement and refused miralax.  Educated on use and the connection between pain medication and bowel movements.   Problem: RH BLADDER ELIMINATION Goal: RH STG MANAGE BLADDER WITH ASSISTANCE Description: STG Manage Bladder With min toileting Assistance Outcome: Not Progressing Note: Patient offered bedside commode and transfer to bathroom but patient refused.   Problem: RH SAFETY Goal: RH STG ADHERE TO SAFETY PRECAUTIONS W/ASSISTANCE/DEVICE Description: STG Adhere to Safety Precautions With cues Assistance/Device. Outcome: Progressing   Problem: RH PAIN MANAGEMENT Goal: RH STG PAIN MANAGED AT OR BELOW PT'S PAIN GOAL Description: < 4 with prns Outcome: Progressing   Problem: RH KNOWLEDGE DEFICIT Goal: RH STG INCREASE KNOWLEDGE OF DIABETES Description: Patient and daughter will be able to manage DM with medications and dietary modifications using educational resources independently Outcome: Progressing Goal: RH STG INCREASE KNOWLEDGE OF HYPERTENSION Description: Patient and daughter will be able to manage HTN with medications and dietary modifications using educational resources independently Outcome: Progressing Goal: RH STG INCREASE KNOWLEDGE OF DYSPHAGIA/FLUID INTAKE Description: Patient and  daughter will be able to manage Dysphagia, medications and dietary modifications using educational resources independently Outcome: Progressing Goal: RH STG INCREASE KNOWLEGDE OF HYPERLIPIDEMIA Description: Patient and daughter will be able to manage HLD with medications and dietary modifications using educational resources independently Outcome: Progressing Goal: RH STG INCREASE KNOWLEDGE OF STROKE PROPHYLAXIS Description: Patient and daughter will be able to manage secondary stroke risks with medications and dietary modifications using educational resources independently Outcome: Progressing   Problem: Education: Goal: Understanding of CV disease, CV risk reduction, and recovery process will improve Outcome: Progressing Goal: Individualized Educational Video(s) Outcome: Progressing   Problem: Activity: Goal: Ability to return to baseline activity level will improve Outcome: Not Progressing Note: Outside of therapy patient does not get out of bed with nursing staff.    Problem: Cardiovascular: Goal: Ability to achieve and maintain adequate cardiovascular perfusion will improve Outcome: Progressing Goal: Vascular access site(s) Level 0-1 will be maintained Outcome: Progressing   Problem: Health Behavior/Discharge Planning: Goal: Ability to safely manage health-related needs after discharge will improve Outcome: Progressing   Problem: Education: Goal: Knowledge of disease or condition will improve Outcome: Progressing Goal: Knowledge of secondary prevention will improve (MUST DOCUMENT ALL) Outcome: Progressing Goal: Knowledge of patient specific risk factors will improve Loraine Leriche N/A or DELETE if not current risk factor) Outcome: Progressing   Problem: Ischemic Stroke/TIA Tissue Perfusion: Goal: Complications of ischemic stroke/TIA will be minimized Outcome: Progressing   Problem: Coping: Goal: Will verbalize positive feelings about self Outcome: Progressing Goal: Will  identify appropriate support needs Outcome: Progressing   Problem: Health Behavior/Discharge Planning: Goal: Ability to manage health-related needs will improve Outcome: Progressing Goal: Goals will be collaboratively established with patient/family Outcome: Not Progressing Note: Family encouraged to participate in patient care.   Problem: Self-Care: Goal: Ability to participate in self-care as condition permits will improve Outcome: Not Progressing  Note: Patient encouraged but does not follow through with self care. Goal: Verbalization of feelings and concerns over difficulty with self-care will improve Outcome: Not Progressing Goal: Ability to communicate needs accurately will improve Outcome: Not Progressing   Problem: Nutrition: Goal: Risk of aspiration will decrease Outcome: Progressing Goal: Dietary intake will improve Outcome: Not Progressing

## 2022-10-30 DIAGNOSIS — R630 Anorexia: Secondary | ICD-10-CM

## 2022-10-30 LAB — GLUCOSE, CAPILLARY
Glucose-Capillary: 147 mg/dL — ABNORMAL HIGH (ref 70–99)
Glucose-Capillary: 211 mg/dL — ABNORMAL HIGH (ref 70–99)
Glucose-Capillary: 220 mg/dL — ABNORMAL HIGH (ref 70–99)

## 2022-10-30 MED ORDER — SORBITOL 70 % SOLN
60.0000 mL | Freq: Once | Status: AC
  Administered 2022-10-30: 60 mL via ORAL
  Filled 2022-10-30: qty 60

## 2022-10-30 MED ORDER — SENNOSIDES-DOCUSATE SODIUM 8.6-50 MG PO TABS
3.0000 | ORAL_TABLET | Freq: Every day | ORAL | Status: DC
  Administered 2022-10-30 – 2022-11-01 (×3): 3 via ORAL
  Filled 2022-10-30 (×2): qty 3

## 2022-10-30 NOTE — Plan of Care (Signed)
  Problem: Consults Goal: RH STROKE PATIENT EDUCATION Description: See Patient Education module for education specifics  Outcome: Progressing   Problem: RH BOWEL ELIMINATION Goal: RH STG MANAGE BOWEL WITH ASSISTANCE Description: STG Manage Bowel with min Assistance. Outcome: Not Progressing Goal: RH STG MANAGE BOWEL W/MEDICATION W/ASSISTANCE Description: STG Manage Bowel with Medication with mod I Assistance. Outcome: Not Progressing   Problem: RH BLADDER ELIMINATION Goal: RH STG MANAGE BLADDER WITH ASSISTANCE Description: STG Manage Bladder With min toileting Assistance Outcome: Progressing   Problem: RH SAFETY Goal: RH STG ADHERE TO SAFETY PRECAUTIONS W/ASSISTANCE/DEVICE Description: STG Adhere to Safety Precautions With cues Assistance/Device. Outcome: Progressing   Problem: RH PAIN MANAGEMENT Goal: RH STG PAIN MANAGED AT OR BELOW PT'S PAIN GOAL Description: < 4 with prns Outcome: Progressing   Problem: RH KNOWLEDGE DEFICIT Goal: RH STG INCREASE KNOWLEDGE OF DIABETES Description: Patient and daughter will be able to manage DM with medications and dietary modifications using educational resources independently Outcome: Progressing Goal: RH STG INCREASE KNOWLEDGE OF HYPERTENSION Description: Patient and daughter will be able to manage HTN with medications and dietary modifications using educational resources independently Outcome: Progressing Goal: RH STG INCREASE KNOWLEDGE OF DYSPHAGIA/FLUID INTAKE Description: Patient and daughter will be able to manage Dysphagia, medications and dietary modifications using educational resources independently Outcome: Progressing Goal: RH STG INCREASE KNOWLEGDE OF HYPERLIPIDEMIA Description: Patient and daughter will be able to manage HLD with medications and dietary modifications using educational resources independently Outcome: Progressing Goal: RH STG INCREASE KNOWLEDGE OF STROKE PROPHYLAXIS Description: Patient and daughter will be  able to manage secondary stroke risks with medications and dietary modifications using educational resources independently Outcome: Progressing   Problem: Education: Goal: Understanding of CV disease, CV risk reduction, and recovery process will improve Outcome: Progressing Goal: Individualized Educational Video(s) Outcome: Progressing   Problem: Activity: Goal: Ability to return to baseline activity level will improve Outcome: Not Progressing   Problem: Cardiovascular: Goal: Ability to achieve and maintain adequate cardiovascular perfusion will improve Outcome: Progressing Goal: Vascular access site(s) Level 0-1 will be maintained Outcome: Progressing   Problem: Health Behavior/Discharge Planning: Goal: Ability to safely manage health-related needs after discharge will improve Outcome: Progressing   Problem: Education: Goal: Knowledge of disease or condition will improve Outcome: Progressing Goal: Knowledge of secondary prevention will improve (MUST DOCUMENT ALL) Outcome: Progressing Goal: Knowledge of patient specific risk factors will improve Loraine Leriche N/A or DELETE if not current risk factor) Outcome: Progressing   Problem: Ischemic Stroke/TIA Tissue Perfusion: Goal: Complications of ischemic stroke/TIA will be minimized Outcome: Progressing   Problem: Coping: Goal: Will verbalize positive feelings about self Outcome: Progressing Goal: Will identify appropriate support needs Outcome: Progressing   Problem: Health Behavior/Discharge Planning: Goal: Ability to manage health-related needs will improve Outcome: Not Progressing Goal: Goals will be collaboratively established with patient/family Outcome: Not Progressing   Problem: Self-Care: Goal: Ability to participate in self-care as condition permits will improve Outcome: Not Progressing Goal: Verbalization of feelings and concerns over difficulty with self-care will improve Outcome: Progressing Goal: Ability to  communicate needs accurately will improve Outcome: Progressing   Problem: Nutrition: Goal: Risk of aspiration will decrease Outcome: Progressing Goal: Dietary intake will improve Outcome: Progressing

## 2022-10-30 NOTE — Plan of Care (Signed)
  Problem: RH Balance Goal: LTG Patient will maintain dynamic sitting balance (PT) Description: LTG:  Patient will maintain dynamic sitting balance with assistance during mobility activities (PT) Outcome: Completed/Met Goal: LTG Patient will maintain dynamic standing balance (PT) Description: LTG:  Patient will maintain dynamic standing balance with assistance during mobility activities (PT) Outcome: Completed/Met   Problem: Sit to Stand Goal: LTG:  Patient will perform sit to stand with assistance level (PT) Description: LTG:  Patient will perform sit to stand with assistance level (PT) Outcome: Completed/Met   Problem: RH Bed Mobility Goal: LTG Patient will perform bed mobility with assist (PT) Description: LTG: Patient will perform bed mobility with assistance, with/without cues (PT). Outcome: Completed/Met   Problem: RH Bed to Chair Transfers Goal: LTG Patient will perform bed/chair transfers w/assist (PT) Description: LTG: Patient will perform bed to chair transfers with assistance (PT). Outcome: Completed/Met   Problem: RH Car Transfers Goal: LTG Patient will perform car transfers with assist (PT) Description: LTG: Patient will perform car transfers with assistance (PT). Outcome: Completed/Met   Problem: RH Furniture Transfers Goal: LTG Patient will perform furniture transfers w/assist (OT/PT) Description: LTG: Patient will perform furniture transfers  with assistance (OT/PT). Outcome: Completed/Met   Problem: RH Wheelchair Mobility Goal: LTG Patient will propel w/c in controlled environment (PT) Description: LTG: Patient will propel wheelchair in controlled environment, # of feet with assist (PT) Outcome: Completed/Met Goal: LTG Patient will propel w/c in home environment (PT) Description: LTG: Patient will propel wheelchair in home environment, # of feet with assistance (PT). Outcome: Completed/Met

## 2022-10-30 NOTE — Plan of Care (Signed)
  Problem: RH Swallowing Goal: LTG Patient will consume least restrictive diet using compensatory strategies with assistance (SLP) Description: LTG:  Patient will consume least restrictive diet using compensatory strategies with assistance (SLP) Outcome: Completed/Met Flowsheets (Taken 10/04/2022 1527 by Sarita Bottom A, CCC-SLP) LTG: Pt Patient will consume least restrictive diet using compensatory strategies with assistance of (SLP): Supervision   Problem: RH Cognition - SLP Goal: RH LTG Patient will demonstrate orientation with cues Description:  LTG:  Patient will demonstrate orientation to person/place/time/situation with cues (SLP)   Outcome: Completed/Met Flowsheets (Taken 10/04/2022 1527 by Sarita Bottom A, CCC-SLP) LTG Patient will demonstrate orientation to:  Place  Time  Situation LTG: Patient will demonstrate orientation using cueing (SLP): Supervision   Problem: RH Memory Goal: LTG Patient will demonstrate ability for day to day (SLP) Description: LTG:   Patient will demonstrate ability for day to day recall/carryover during cognitive/linguistic activities with assist  (SLP) Outcome: Completed/Met Flowsheets (Taken 10/04/2022 1527 by Sarita Bottom A, CCC-SLP) LTG: Patient will demonstrate ability for day to day recall: New information LTG: Patient will demonstrate ability for day to day recall/carryover during cognitive/linguistic activities with assist (SLP): Minimal Assistance - Patient > 75% Goal: LTG Patient will use memory compensatory aids to (SLP) Description: LTG:  Patient will use memory compensatory aids to recall biographical/new, daily complex information with cues (SLP) Outcome: Completed/Met Flowsheets (Taken 10/04/2022 1527 by Sarita Bottom A, CCC-SLP) LTG: Patient will use memory compensatory aids to (SLP): Supervision   Problem: RH Attention Goal: LTG Patient will demonstrate this level of attention during functional activites (SLP) Description: LTG:   Patient will will demonstrate this level of attention during functional activites (SLP) Outcome: Completed/Met Flowsheets (Taken 10/04/2022 1527 by Sarita Bottom A, CCC-SLP) LTG: Patient will demonstrate this level of attention during cognitive/linguistic activities with assistance of (SLP): Supervision   Problem: RH Awareness Goal: LTG: Patient will demonstrate awareness during functional activites type of (SLP) Description: LTG: Patient will demonstrate awareness during functional activites type of (SLP) Outcome: Completed/Met Flowsheets (Taken 10/04/2022 1527 by Sarita Bottom A, CCC-SLP) LTG: Patient will demonstrate awareness during cognitive/linguistic activities with assistance of (SLP): Supervision

## 2022-10-30 NOTE — Progress Notes (Signed)
Occupational Therapy Discharge Summary  Patient Details  Name: [name] MRN: [id number] Date of Birth: [birth year]  Date of Discharge from OT service:October 30, 2022  Today's Date: 10/30/2022 OT Individual Time: 1345-1400 OT Individual Time Calculation (min): 15 min    Patient has met 7 of 9 long term goals due to improved activity tolerance, improved balance, and ability to compensate for deficits.  Patient to discharge at overall Min - Mod Assist level.  Patient's care partner is independent to provide the necessary physical and cognitive assistance at discharge.  Patient's family completed hands on training and education on 10/27/22 and demonstrated adequate ability to safely manage patient at a wheelchair level.   Skilled Intervention:  Pt semi reclined in bed, reporting she just vomitted and is still feeling nauseous and requesting therapist come back later.  Nurse aware of pts reports and presentation.  OT returned approximately 30 minutes later, however patient reporting she doesn't want to do any therapy because her lunch is about to arrive.  Pt agreeable to bed level assessment including completion of BIMs and LUE assessment for discharge. Pt asking if she will get pain medication to take home with her tomorrow at discharge, and therapist referred to nursing to answer this question. Pt has no further questions for OT at this time.  Pt declined therapy thereafter.  Call bell in reach, bed alarm on.    Reasons goals not met: Patient requires mod assist for safe completion of LB dressing and limited progress with LUE strength and control and still trace to no muscle activation noted despite neuro re-ed.  Recommendation:  Patient will benefit from ongoing skilled OT services in home health setting to continue to advance functional skills in the area of BADL and iADL.  Equipment: Hospital bed, drop-arm bedside commode, tub seat, specialty wheelchair with left half lap tray.  Reasons for  discharge: discharge from hospital  Patient/family agrees with progress made and goals achieved: Yes   OT Discharge Precautions/Restrictions  Precautions Precautions: Fall Precaution Comments: Dense L hemi (UE > LE) Restrictions Weight Bearing Restrictions: No Pain Pain Assessment Pain Scale: 0-10 Pain Score: 0-No pain Faces Pain Scale: No hurt ADL ADL Eating: Minimal assistance Where Assessed-Eating: Wheelchair Grooming: Minimal assistance Where Assessed-Grooming: Wheelchair Upper Body Bathing: Minimal assistance Where Assessed-Upper Body Bathing: Shower, Sitting at sink Lower Body Bathing: Minimal assistance Where Assessed-Lower Body Bathing: Sitting at sink, Shower Upper Body Dressing: Minimal assistance Where Assessed-Upper Body Dressing: Sitting at sink Lower Body Dressing: Moderate assistance Where Assessed-Lower Body Dressing: Edge of bed, Sitting at sink Toileting: Minimal assistance Where Assessed-Toileting: Bedside Commode Toilet Transfer: Minimal assistance Toilet Transfer Method: Ambulance person: Psychiatric nurse: Insurance underwriter Method: Stand pivot ADL Comments: Pt greatly fatigued on eval, tolerating ~8-10 mins of sitting EOB with Min-Mod A, to bathe UB with overall Mod A. Vision Baseline Vision/History: 1 Wears glasses Patient Visual Report: No change from baseline Vision Assessment?: No apparent visual deficits Perception  Perception: Impaired Inattention/Neglect: Does not attend to left side of body Praxis Praxis: Impaired Praxis Impairment Details: Initiation;Motor planning Cognition Cognition Overall Cognitive Status: Impaired/Different from baseline Arousal/Alertness: Awake/alert Orientation Level: Place;Person;Situation Person: Oriented Place: Oriented Situation: Oriented Memory: Impaired Memory Impairment: Retrieval deficit;Decreased recall of new  information;Decreased short term memory Decreased Short Term Memory: Verbal basic;Functional basic Attention: Sustained;Selective;Alternating Focused Attention: Appears intact Sustained Attention: Appears intact Sustained Attention Impairment: Verbal basic;Functional basic Selective Attention: Impaired Selective Attention Impairment: Verbal basic;Functional basic  Alternating Attention Impairment: Verbal basic;Functional basic Awareness: Impaired Awareness Impairment: Emergent impairment Problem Solving: Impaired Problem Solving Impairment: Verbal basic;Functional basic Executive Function: Reasoning;Self Correcting;Organizing Reasoning: Impaired Reasoning Impairment: Functional basic;Verbal basic Organizing: Impaired Organizing Impairment: Verbal basic;Functional basic Self Correcting: Impaired Self Correcting Impairment: Verbal basic;Functional basic Behaviors: Poor frustration tolerance Safety/Judgment: Impaired Comments: Decreased insight in to full limitations Brief Interview for Mental Status (BIMS) Repetition of Three Words (First Attempt): 3 Temporal Orientation: Year: Correct Temporal Orientation: Month: Accurate within 5 days Temporal Orientation: Day: Incorrect ("saturday") Recall: "Sock": Yes, no cue required Recall: "Blue": Yes, no cue required Recall: "Bed": No, could not recall BIMS Summary Score: 12 Sensation Sensation Light Touch: Impaired by gross assessment Peripheral sensation comments: R LE light touch grossly intact, L LE light touch impaired, pt unable to detect light touch of finger on any dermatome Light Touch Impaired Details: Impaired LUE;Impaired LLE Hot/Cold: Impaired Detail Hot/Cold Impaired Details: Impaired LUE;Impaired LLE Proprioception: Impaired Detail Proprioception Impaired Details: Impaired LUE;Impaired LLE Stereognosis: Impaired by gross assessment Stereognosis Impaired Details: Impaired LUE;Impaired LLE Coordination Gross Motor  Movements are Fluid and Coordinated: No Fine Motor Movements are Fluid and Coordinated: No Coordination and Movement Description: Impaired due to L hemiparesis Finger Nose Finger Test: NT due to L LE hemiparesis Heel Shin Test: NT due to L LE hemiparesis Motor  Motor Motor: Hemiplegia;Abnormal tone;Motor apraxia;Abnormal postural alignment and control Motor - Discharge Observations: Dense L hemi ( UE > LE) Mobility  Bed Mobility Bed Mobility: Rolling Right;Rolling Left;Supine to Sit;Sit to Supine Rolling Right: Minimal Assistance - Patient > 75% Rolling Left: Minimal Assistance - Patient > 75% Supine to Sit: Moderate Assistance - Patient 50-74% Sit to Supine: Moderate Assistance - Patient 50-74% Transfers Sit to Stand: Minimal Assistance - Patient > 75% Stand to Sit: Minimal Assistance - Patient > 75%  Trunk/Postural Assessment  Cervical Assessment Cervical Assessment: Exceptions to [facility] (forward head) Thoracic Assessment Thoracic Assessment: Exceptions to [facility] (rounded shoulders) Lumbar Assessment Lumbar Assessment:  (posterior pelvic tilt) Postural Control Postural Control: Deficits on evaluation Trunk Control: Poor - trunk lean L and posterior Righting Reactions: delayed and insufficient Protective Responses: decreased to absent for lean.  Balance Balance Balance Assessed: Yes Static Sitting Balance Static Sitting - Balance Support: Right upper extremity supported;Feet supported Static Sitting - Level of Assistance: 6: Modified independent (Device/Increase time) Dynamic Sitting Balance Dynamic Sitting - Balance Support: Feet supported;Right upper extremity supported Dynamic Sitting - Level of Assistance: 5: Stand by assistance Dynamic Sitting - Balance Activities: Reaching across midline Sitting balance - Comments: mild left lean in sitting Extremity/Trunk Assessment RUE Assessment RUE Assessment: Within Functional Limits LUE Assessment LUE Assessment: Exceptions to  [facility] LUE Body System: Neuro Brunstrum levels for arm and hand: Arm;Hand Brunstrum level for arm: Stage I Presynergy Brunstrum level for hand: Stage I Flaccidity LUE AROM (degrees) Overall AROM Left Upper Extremity: Deficits LUE Overall AROM Comments: Trace activation noted in elbow flexors, elbow extensors, and shoulder horizontal adduction; 0/5 distally to elbow.   [name] 10/30/2022, 2:04 PM

## 2022-10-30 NOTE — Progress Notes (Signed)
PROGRESS NOTE   Subjective/Complaints:  Pt doing alright, still doesn't have a great appetite yet. Still hasn't had a BM after the 48ml sorbitol yesterday. Doesn't want an enema, prefers to try sorbitol again, higher dose today. Slept alright, pain is managed with meds, and she's urinating well. Denies any other complaints or concerns today.   ROS: Limited due to cognitive/behavioral, but she denies CP, SOB, abd pain, n/v/d.    Objective:   No results found. No results for input(s): "WBC", "HGB", "HCT", "PLT" in the last 72 hours.     No results for input(s): "NA", "K", "CL", "CO2", "GLUCOSE", "BUN", "CREATININE", "CALCIUM" in the last 72 hours.      Intake/Output Summary (Last 24 hours) at 10/30/2022 1036 Last data filed at 10/30/2022 1000 Gross per 24 hour  Intake 240 ml  Output 500 ml  Net -260 ml        Physical Exam: Vital Signs Blood pressure 111/72, pulse 65, temperature 98.3 F (36.8 C), resp. rate 15, height 4\' 11"  (1.499 m), weight 65.3 kg, last menstrual period 06/22/2022, SpO2 98 %.  Constitutional: No distress . Vital signs reviewed. HEENT: NCAT, EOMI, oral membranes moist Neck: supple Cardiovascular: RRR without murmur. No JVD    Respiratory/Chest: CTA Bilaterally without wheezes or rales. Normal effort    GI/Abdomen: BS +, very mildly TTP generalized throughout, non-distended Ext: no clubbing, cyanosis, or edema Psych: very flat but cooperative, more subdued today Skin: dry, warm  PRIOR EXAMS: MSK: LLE was painful along lower leg when I passively ranged it.  Normal PROM Neurologic: Alert, makes eye contact. Left central 7.  Speech dysarthric, low volume.  Minimal verbal output.  Language appears to be intact. Left inattention. MILDLY increased tone LUE (flexor at elbow) and LLE---but generally easy ROM. LUE and LLE 0/5 with decreased sensation to LT/pain on left.      Assessment/Plan: 1.  Functional deficits which require 3+ hours per day of interdisciplinary therapy in a comprehensive inpatient rehab setting. Physiatrist is providing close team supervision and 24 hour management of active medical problems listed below. Physiatrist and rehab team continue to assess barriers to discharge/monitor patient progress toward functional and medical goals  Care Tool:  Bathing    Body parts bathed by patient: Left arm, Chest, Abdomen, Front perineal area, Right upper leg, Left upper leg, Face, Right lower leg, Left lower leg, Buttocks   Body parts bathed by helper: Right arm     Bathing assist Assist Level: Minimal Assistance - Patient > 75%     Upper Body Dressing/Undressing Upper body dressing   What is the patient wearing?: Pull over shirt    Upper body assist Assist Level: Minimal Assistance - Patient > 75%    Lower Body Dressing/Undressing Lower body dressing      What is the patient wearing?: Incontinence brief, Pants     Lower body assist Assist for lower body dressing: Moderate Assistance - Patient 50 - 74%     Toileting Toileting Toileting Activity did not occur (Clothing management and hygiene only): N/A (no void or bm)  Toileting assist Assist for toileting: Minimal Assistance - Patient > 75%     Transfers  Chair/bed transfer  Transfers assist  Chair/bed transfer activity did not occur: Safety/medical concerns  Chair/bed transfer assist level: Minimal Assistance - Patient > 75%     Locomotion Ambulation   Ambulation assist   Ambulation activity did not occur: Safety/medical concerns          Walk 10 feet activity   Assist  Walk 10 feet activity did not occur: Safety/medical concerns        Walk 50 feet activity   Assist Walk 50 feet with 2 turns activity did not occur: Safety/medical concerns         Walk 150 feet activity   Assist Walk 150 feet activity did not occur: Safety/medical concerns         Walk 10 feet on  uneven surface  activity   Assist Walk 10 feet on uneven surfaces activity did not occur: Safety/medical concerns         Wheelchair     Assist Is the patient using a wheelchair?: Yes Type of Wheelchair: Manual Wheelchair activity did not occur: Safety/medical concerns         Wheelchair 50 feet with 2 turns activity    Assist    Wheelchair 50 feet with 2 turns activity did not occur: Safety/medical concerns       Wheelchair 150 feet activity     Assist  Wheelchair 150 feet activity did not occur: Safety/medical concerns       Blood pressure 111/72, pulse 65, temperature 98.3 F (36.8 C), resp. rate 15, height 4\' 11"  (1.499 m), weight 65.3 kg, last menstrual period 06/22/2022, SpO2 98 %.    Medical Problem List and Plan: 1. 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  Antithrombotics: -DVT/anticoagulation:  Pharmaceutical: Eliquis 5mg  BID             -antiplatelet therapy: N/A 3. Back pain/Pain Management:  Continue magnesium 250mg  HS (>>500mg  QHS). Tylenol prn. Decrease MSIR 15mg  to q8H prn  4. Chronic anxiety, Insomnia: Resumed home atarax 10mg  TID PRN             -continue lexapro 5mg  qhs             -neuropsych eval -10/02/22 started Trazodone 25mg  QHS and Hydroxyzine 25mg  QHS for now, monitor for effect or oversedation; left Hydroxyzine 10mg  TID PRN dosing for anxiety in place  -10/08/22 added Melatonin 5mg  QHS  -4/3 increased lexapro to 10mg  for decreased mood/anxiety -4/5 I suspect pt's affect is partially due to right MCA infarct  5. Neuropsych/cognition: This patient is not capable of making decisions on her own behalf.  -on amantadine for initiation/arousal 6. Skin/Wound Care:  Routine pressure relief  measures.  7.  Fluids/Electrolytes/Nutrition: intake is poor.  -4/5 will begin trial of megace 400mg  BID  -10/30/22 still with poor appetite, eating very little 8. Acute on chronic combined CHF: HH diet.  -Monitor weights daily and for signs of overload -continue Torsemide 40mg  QD (>>20mg  QD), Losartan 50mg  QD, Spironolactone 25mg  QD, Jardiance 10mg  QD (>>25mg  QD), and Metoprolol 25mg  BID  --Increase torsemide back to 20mg  daily -4/2 no signs  of overload, weight a little up continue to monitor, continue current regimen -4/5 weight has steadily fallen from 3/9 (86kg) to today (64kg)--unfortunately this is also due to poor nutrition (see #7) -10/30/22 wt stable at new baseline, no fluid overload on exam Filed Weights   10/28/22 0401 10/29/22 0524 10/30/22 0500  Weight: 64 kg 66.3 kg 65.3 kg   Vitals:   10/27/22 1253 10/27/22 1943 10/28/22 0401 10/28/22 1341  BP: 120/87 110/71 121/84 106/73   10/28/22 1927 10/29/22 0524 10/29/22 0834 10/29/22 0835  BP: 125/82 108/70 94/62 94/62    10/29/22 1311 10/29/22 1913 10/30/22 0545 10/30/22 0748  BP: 91/67 108/73 109/67 111/72    9. T2DM: Hgb A1C-10.8  (improving >15 @ 11/23). Was on Lantus 15u w/ novolog 12 units with supper>>not restarted             --monitor BS ac/hs and use SSI for elevated BS             --on SSI, Jardiance  QD (>>25mg  QD) -10/02/22 CBGs well controlled with 2-3U of novolog use per meal so far, monitor for trends  -3/13 increase Jardiance to   -d/c ISS, discussed good blood sugar control with patient  -Add magnesium gluconate  HS>> increased to  QHS  -4/2 Add glipizide 2.5mg  for improved control  -4/5-increased glipizide to  daily on 4/4   -no further changes until PO picks up  -10/30/22 CBGs improving control, cont regimen  CBG (last 3)  Recent Labs    10/29/22 1656 10/29/22 2049 10/30/22 0557  GLUCAP 173* 185* 147*    10. Severe obesity: Educate patient on diet, compliance and exercise to help promote health and  mobility. Add magnesium gluconate  HS, increased to  QHS 11. Acute on Chronic renal failure/AKI:  -4/2 Cr/BUN stable at 1.19/13 yesterday, continue current regimen for now  12. Hyperlipidemia: continue lipitor  QHS  13. Decreased initiation: increase amantadine to  daily, continue, appears to be helping, discussed improvements with therapy 14. Vitamin D insufficiency: 21.41 on 10/04/22, continue ergocalciferol 50,000U once per week for 7 weeks. Daily supplement d/ced 15. Bilateral lower extremity throbbing: -10/09/22 SCDs need adjusting, asked nursing to assist; added Robaxin  q6h PRN for leg spasms -[place] reviewed and negative for clots, asking nursing to check whether SCDs are working, continue SCDs, asked nursing to apply them -Discussed baclofen but she defers due to side effect of constipation.  -4/5 pain more in LLE today. Continue tylenol, robaxin, prn ms ir 16. Constipation:   -10/08/22 increased miralax 17g BID, add colace  QD  -10/18/22 senokot 2 tabs QD  added, plus 2 tabs QD PRN if no BM in 24hrs -Magnesium citrate ordered 3/27 with good effect. Decrease Miralax to daily.  -10/22/22 pt reports LBM yesterday but not documented since 10/19/22; monitor for now, but may need to go back up on Miralax -10/23/22 LBM yesterday, cont regimen  -4/5 still no bm since 3/29---sorbitol today -needs to eat more as well -4/6 will give sorbitol 30cc after therapy, LBM 2-3 days ago and feels constipated. Also increased miralax to BID and added colace  QD back on -10/30/22 still no BM, would like to avoid enema, wants to try sorbitol 60ml dose today, and increase senokot to 3 tabs QD, monitor for effect; if no BM tonight, may consider Mag Citrate tomorrow morning since that's been effective before.  17. Increased tone  -4/3 consider baclofen. For now splinting, rom    LOS: 29 days A FACE TO FACE  EVALUATION WAS PERFORMED  [id number] 10/30/2022, 10:36 AM

## 2022-10-30 NOTE — Progress Notes (Signed)
Speech Language Pathology Discharge Summary  Patient Details  Name: [name] MRN: [id number] Date of Birth: 08-Apr-1976  Date of Discharge from SLP service:October 30, 2022  Today's Date: 10/30/2022 SLP Individual Time: 0815-0900 SLP Individual Time Calculation (min): 45 min   Skilled Therapeutic Interventions: Patient was seen for skilled ST services with focus on education and support for discharge.  Upon SLP entry, patient was in bed with all lights off, scrolling on her phone.  Patient was agreeable to treatment; however, required cueing to put phone down to increase focus and engagement.  SLP reviewed safe swallow strategies, as well as, memory strategies.  Memory handout was provided.  Patient required encouragement to answer questions regarding external and internal aid usage.  Patient did endorse difficulty focusing and occasional memory difficulty.  Patient reports that she would like to discharge on Dys 3, thin diet; though, SLP noted from previous therapist she was safe for regular diet.  Patient reported she did not like the food and would be looking forward to eating a chili dog when she gets home.  Patient declined regular trials this date.   All education was completed with patient.  Patient was left in room with bed alarm activated and all immediate needs within reach.  To discharge from speech therapy.     Patient has met 10 (12) of 10 (12) long term goals.  Patient to discharge at [facility] level.  Reasons goals not met:   NA  Clinical Impression/Discharge Summary: Pt's ability fluctuates between supA and minA overall level for cognition, as well as, supA for dysphagia. Based off review of previous notes, pt has met all LTGs. She will require assistance at home with iADLs. Discharging on a Dys3/thin liquid diet per patient request.  Care Partner:  Caregiver Able to Provide Assistance: Yes  Type of Caregiver Assistance: Cognitive  Recommendation:  Home Health  SLP;Outpatient SLP  Rationale for SLP Follow Up: Maximize cognitive function and independence;Reduce caregiver burden   Equipment:     Reasons for discharge: Discharged from hospital   Patient/Family Agrees with Progress Made and Goals Achieved: Yes    Dorena Bodo 10/30/2022, 12:04 PM

## 2022-10-31 ENCOUNTER — Other Ambulatory Visit (HOSPITAL_COMMUNITY): Payer: Self-pay

## 2022-10-31 LAB — BASIC METABOLIC PANEL
Anion gap: 15 (ref 5–15)
BUN: 25 mg/dL — ABNORMAL HIGH (ref 6–20)
CO2: 29 mmol/L (ref 22–32)
Calcium: 9.3 mg/dL (ref 8.9–10.3)
Chloride: 90 mmol/L — ABNORMAL LOW (ref 98–111)
Creatinine, Ser: 1.62 mg/dL — ABNORMAL HIGH (ref 0.44–1.00)
GFR, Estimated: 40 mL/min — ABNORMAL LOW (ref >60)
Glucose, Bld: 163 mg/dL — ABNORMAL HIGH (ref 70–99)
Potassium: 3.8 mmol/L (ref 3.5–5.1)
Sodium: 134 mmol/L — ABNORMAL LOW (ref 135–145)

## 2022-10-31 LAB — CBC
HCT: 47.7 % — ABNORMAL HIGH (ref 36.0–46.0)
Hemoglobin: 15.4 g/dL — ABNORMAL HIGH (ref 12.0–15.0)
MCH: 22.2 pg — ABNORMAL LOW (ref 26.0–34.0)
MCHC: 32.3 g/dL (ref 30.0–36.0)
MCV: 68.7 fL — ABNORMAL LOW (ref 80.0–100.0)
Platelets: 177 10*3/uL (ref 150–400)
RBC: 6.94 MIL/uL — ABNORMAL HIGH (ref 3.87–5.11)
RDW: 22.2 % — ABNORMAL HIGH (ref 11.5–15.5)
WBC: 7.7 10*3/uL (ref 4.0–10.5)
nRBC: 0 % (ref 0.0–0.2)

## 2022-10-31 LAB — GLUCOSE, CAPILLARY
Glucose-Capillary: 201 mg/dL — ABNORMAL HIGH (ref 70–99)
Glucose-Capillary: 167 mg/dL — ABNORMAL HIGH (ref 70–99)
Glucose-Capillary: 172 mg/dL — ABNORMAL HIGH (ref 70–99)
Glucose-Capillary: 235 mg/dL — ABNORMAL HIGH (ref 70–99)

## 2022-10-31 LAB — PREALBUMIN: Prealbumin: 20 mg/dL (ref 18–38)

## 2022-10-31 MED ORDER — METOPROLOL TARTRATE 25 MG PO TABS
25.0000 mg | ORAL_TABLET | Freq: Two times a day (BID) | ORAL | 0 refills | Status: DC
  Filled 2022-10-31 – 2022-11-01 (×2): qty 60, 30d supply, fill #0

## 2022-10-31 MED ORDER — MELATONIN 5 MG PO TABS
5.0000 mg | ORAL_TABLET | Freq: Every day | ORAL | 0 refills | Status: DC
  Filled 2022-10-31: qty 30, 30d supply, fill #0

## 2022-10-31 MED ORDER — TRAZODONE HCL 50 MG PO TABS
25.0000 mg | ORAL_TABLET | Freq: Every day | ORAL | 0 refills | Status: DC
  Filled 2022-10-31: qty 30, 60d supply, fill #0

## 2022-10-31 MED ORDER — TORSEMIDE 20 MG PO TABS: 20.0000 mg | ORAL_TABLET | Freq: Every day | ORAL | Status: DC

## 2022-10-31 MED ORDER — ESCITALOPRAM OXALATE 10 MG PO TABS
10.0000 mg | ORAL_TABLET | Freq: Every day | ORAL | 0 refills | Status: DC
  Filled 2022-10-31: qty 30, 30d supply, fill #0

## 2022-10-31 MED ORDER — POLYETHYLENE GLYCOL 3350 17 GM/SCOOP PO POWD
17.0000 g | Freq: Two times a day (BID) | ORAL | 0 refills | Status: DC
  Filled 2022-10-31: qty 238, 7d supply, fill #0

## 2022-10-31 MED ORDER — MORPHINE SULFATE 15 MG PO TABS
15.0000 mg | ORAL_TABLET | Freq: Three times a day (TID) | ORAL | 0 refills | Status: DC | PRN
  Filled 2022-10-31: qty 10, 4d supply, fill #0

## 2022-10-31 MED ORDER — SPIRONOLACTONE 25 MG PO TABS: 25.0000 mg | ORAL_TABLET | Freq: Every day | ORAL | Status: DC

## 2022-10-31 MED ORDER — ATORVASTATIN CALCIUM 80 MG PO TABS
80.0000 mg | ORAL_TABLET | Freq: Every day | ORAL | 0 refills | Status: DC
  Filled 2022-10-31 – 2022-11-01 (×2): qty 30, 30d supply, fill #0

## 2022-10-31 MED ORDER — HYDROXYZINE HCL 10 MG PO TABS
10.0000 mg | ORAL_TABLET | Freq: Three times a day (TID) | ORAL | 0 refills | Status: DC | PRN
  Filled 2022-10-31: qty 30, 10d supply, fill #0

## 2022-10-31 MED ORDER — MUSCLE RUB 10-15 % EX CREA
1.0000 | TOPICAL_CREAM | Freq: Three times a day (TID) | CUTANEOUS | 0 refills | Status: DC
  Filled 2022-10-31: qty 85, 29d supply, fill #0

## 2022-10-31 MED ORDER — GLIPIZIDE 5 MG PO TABS
5.0000 mg | ORAL_TABLET | Freq: Every day | ORAL | 0 refills | Status: DC
  Filled 2022-10-31: qty 30, 30d supply, fill #0

## 2022-10-31 MED ORDER — SPIRONOLACTONE 25 MG PO TABS
25.0000 mg | ORAL_TABLET | Freq: Every day | ORAL | 0 refills | Status: DC
  Filled 2022-10-31: qty 30, 30d supply, fill #0

## 2022-10-31 MED ORDER — SENNOSIDES-DOCUSATE SODIUM 8.6-50 MG PO TABS
3.0000 | ORAL_TABLET | Freq: Every day | ORAL | 0 refills | Status: DC
  Filled 2022-10-31: qty 90, 30d supply, fill #0

## 2022-10-31 MED ORDER — EMPAGLIFLOZIN 25 MG PO TABS: 25.0000 mg | ORAL_TABLET | Freq: Every day | ORAL | Status: DC

## 2022-10-31 MED ORDER — VITAMIN D (ERGOCALCIFEROL) 1.25 MG (50000 UNIT) PO CAPS
50000.0000 [IU] | ORAL_CAPSULE | ORAL | 0 refills | Status: DC
  Filled 2022-10-31: qty 5, 35d supply, fill #0

## 2022-10-31 MED ORDER — TORSEMIDE 20 MG PO TABS
20.0000 mg | ORAL_TABLET | Freq: Every day | ORAL | 0 refills | Status: DC
  Filled 2022-10-31: qty 30, 30d supply, fill #0

## 2022-10-31 MED ORDER — METHOCARBAMOL 500 MG PO TABS
500.0000 mg | ORAL_TABLET | Freq: Four times a day (QID) | ORAL | 0 refills | Status: DC | PRN
  Filled 2022-10-31: qty 60, 15d supply, fill #0

## 2022-10-31 MED ORDER — AMANTADINE HCL 100 MG PO CAPS
200.0000 mg | ORAL_CAPSULE | Freq: Every day | ORAL | 0 refills | Status: DC
  Filled 2022-10-31: qty 30, 15d supply, fill #0

## 2022-10-31 MED ORDER — MEGESTROL ACETATE 400 MG/10ML PO SUSP
400.0000 mg | Freq: Two times a day (BID) | ORAL | 0 refills | Status: DC
  Filled 2022-10-31: qty 480, 24d supply, fill #0

## 2022-10-31 MED ORDER — DOCUSATE SODIUM 100 MG PO CAPS
100.0000 mg | ORAL_CAPSULE | Freq: Every day | ORAL | 0 refills | Status: DC
  Filled 2022-10-31: qty 30, 30d supply, fill #0

## 2022-10-31 MED ORDER — SODIUM CHLORIDE 0.9 % IV SOLN: INTRAVENOUS | Status: DC

## 2022-10-31 MED ORDER — LOSARTAN POTASSIUM 50 MG PO TABS
50.0000 mg | ORAL_TABLET | Freq: Every day | ORAL | 0 refills | Status: DC
  Filled 2022-10-31: qty 30, 30d supply, fill #0

## 2022-10-31 MED ORDER — APIXABAN 5 MG PO TABS
5.0000 mg | ORAL_TABLET | Freq: Two times a day (BID) | ORAL | 0 refills | Status: DC
  Filled 2022-10-31 – 2022-11-01 (×2): qty 60, 30d supply, fill #0

## 2022-10-31 MED ORDER — PANTOPRAZOLE SODIUM 40 MG PO TBEC
40.0000 mg | DELAYED_RELEASE_TABLET | Freq: Every day | ORAL | 0 refills | Status: DC
  Filled 2022-10-31: qty 30, 30d supply, fill #0

## 2022-10-31 MED ORDER — CALCIUM CARBONATE ANTACID 500 MG PO CHEW
1.0000 | CHEWABLE_TABLET | Freq: Every day | ORAL | 0 refills | Status: DC
  Filled 2022-10-31: qty 30, 30d supply, fill #0

## 2022-10-31 MED ORDER — EMPAGLIFLOZIN 25 MG PO TABS
25.0000 mg | ORAL_TABLET | Freq: Every day | ORAL | 0 refills | Status: DC
  Filled 2022-10-31: qty 30, 30d supply, fill #0

## 2022-10-31 NOTE — Progress Notes (Signed)
PROGRESS NOTE   Subjective/Complaints: Right hand tremor is worse  Denies pain at rest, but she sometimes pain on left side, shooting pains in arm and leg Cr up to 1.62  ROS: Limited due to cognitive/behavioral, but she denies CP, SOB, abd pain, n/v/d.    Objective:   No results found. Recent Labs    10/31/22 0558  WBC 7.7  HGB 15.4*  HCT 47.7*  PLT 177       Recent Labs    10/31/22 0558  NA 134*  K 3.8  CL 90*  CO2 29  GLUCOSE 163*  BUN 25*  CREATININE 1.62*  CALCIUM 9.3        Intake/Output Summary (Last 24 hours) at 10/31/2022 1135 Last data filed at 10/30/2022 1703 Gross per 24 hour  Intake 480 ml  Output --  Net 480 ml        Physical Exam: Vital Signs Blood pressure 117/80, pulse 63, temperature 98.7 F (37.1 C), temperature source Oral, resp. rate 18, height 4\' 11"  (1.499 m), weight 65 kg, last menstrual period 06/22/2022, SpO2 98 %.  Constitutional: No distress . Vital signs reviewed. BMI 28.94 HEENT: NCAT, EOMI, oral membranes moist Neck: supple Cardiovascular: RRR without murmur. No JVD    Respiratory/Chest: CTA Bilaterally without wheezes or rales. Normal effort    GI/Abdomen: BS +, very mildly TTP generalized throughout, non-distended Ext: no clubbing, cyanosis, or edema Psych: very flat but cooperative, more subdued today Skin: dry, warm  PRIOR EXAMS: MSK: LLE was painful along lower leg when I passively ranged it.  Normal PROM Neurologic: Alert, makes eye contact. Left central 7.  Speech dysarthric, low volume.  Minimal verbal output.  Language appears to be intact. Left inattention. MILDLY increased tone LUE (flexor at elbow) and LLE---but generally easy ROM. LUE and LLE 0/5 with decreased sensation to LT/pain on left.      Assessment/Plan: 1. Functional deficits which require 3+ hours per day of interdisciplinary therapy in a comprehensive inpatient rehab  setting. Physiatrist is providing close team supervision and 24 hour management of active medical problems listed below. Physiatrist and rehab team continue to assess barriers to discharge/monitor patient progress toward functional and medical goals  Care Tool:  Bathing    Body parts bathed by patient: Left arm, Chest, Abdomen, Front perineal area, Right upper leg, Left upper leg, Face, Right lower leg, Left lower leg, Buttocks   Body parts bathed by helper: Right arm     Bathing assist Assist Level: Minimal Assistance - Patient > 75%     Upper Body Dressing/Undressing Upper body dressing   What is the patient wearing?: Pull over shirt    Upper body assist Assist Level: Minimal Assistance - Patient > 75%    Lower Body Dressing/Undressing Lower body dressing      What is the patient wearing?: Incontinence brief, Pants     Lower body assist Assist for lower body dressing: Moderate Assistance - Patient 50 - 74%     Toileting Toileting Toileting Activity did not occur (Clothing management and hygiene only): N/A (no void or bm)  Toileting assist Assist for toileting: Minimal Assistance - Patient > 75%  Transfers Chair/bed transfer  Transfers assist  Chair/bed transfer activity did not occur: Safety/medical concerns  Chair/bed transfer assist level: Minimal Assistance - Patient > 75% (stand pivot or squat pivot)     Locomotion Ambulation   Ambulation assist   Ambulation activity did not occur: Safety/medical concerns  Assist level: 2 helpers Assistive device: Fara BorosWalker-Eva Max distance: 50 feet   Walk 10 feet activity   Assist  Walk 10 feet activity did not occur: Safety/medical concerns  Assist level: Maximal Assistance - Patient 25 - 49%     Walk 50 feet activity   Assist Walk 50 feet with 2 turns activity did not occur: Safety/medical concerns  Assist level: Maximal Assistance - Patient 25 - 49%      Walk 150 feet activity   Assist Walk 150  feet activity did not occur: Safety/medical concerns         Walk 10 feet on uneven surface  activity   Assist Walk 10 feet on uneven surfaces activity did not occur: Safety/medical concerns         Wheelchair     Assist Is the patient using a wheelchair?: Yes Type of Wheelchair: Manual Wheelchair activity did not occur: Safety/medical concerns  Wheelchair assist level: Maximal Assistance - Patient 25 - 49%      Wheelchair 50 feet with 2 turns activity    Assist    Wheelchair 50 feet with 2 turns activity did not occur: Safety/medical concerns   Assist Level: Maximal Assistance - Patient 25 - 49%   Wheelchair 150 feet activity     Assist  Wheelchair 150 feet activity did not occur: Safety/medical concerns       Blood pressure 117/80, pulse 63, temperature 98.7 F (37.1 C), temperature source Oral, resp. rate 18, height 4\' 11"  (1.499 m), weight 65 kg, last menstrual period 06/22/2022, SpO2 98 %.    Medical Problem List and Plan: 1. 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  Antithrombotics: -DVT/anticoagulation:  Pharmaceutical: Eliquis 5mg  BID             -antiplatelet therapy: N/A 3. Back pain/Pain Management:  Continue magnesium 250mg  HS (>>500mg  QHS). Tylenol prn. Decrease MSIR 15mg  to q8H prn  4. Chronic anxiety, Insomnia: Resumed home atarax 10mg  TID PRN             -continue lexapro 5mg  qhs             -neuropsych eval -10/02/22 started Trazodone 25mg  QHS and Hydroxyzine 25mg  QHS for now, monitor for effect or oversedation; left Hydroxyzine 10mg  TID PRN dosing for anxiety in place  -10/08/22 added Melatonin 5mg  QHS  -4/3 increased lexapro to 10mg  for decreased mood/anxiety -4/5 I suspect pt's affect is partially due to  right MCA infarct  5. Neuropsych/cognition: This patient is not capable of making decisions on her own behalf.  -on amantadine for initiation/arousal 6. Skin/Wound Care:  Routine pressure relief  measures.  7. Fluids/Electrolytes/Nutrition: intake is poor.  -4/5 will begin trial of megace 400mg  BID  -10/30/22 still with poor appetite, eating very little 8. Acute on chronic combined  CHF: HH diet.  -Monitor weights daily and for signs of overload -continue Torsemide 40mg  QD (>>20mg  QD), Losartan 50mg  QD, Spironolactone 25mg  QD, Jardiance 10mg  QD (>>25mg  QD), and Metoprolol 25mg  BID  --Increase torsemide back to 20mg  daily -4/2 no signs of overload, weight a little up continue to monitor, continue current regimen -4/5 weight has steadily fallen from 3/9 (86kg) to today (64kg)--unfortunately this is also due to poor nutrition (see #7) -10/30/22 wt stable at new baseline, no fluid overload on exam Filed Weights   10/29/22 0524 10/30/22 0500 10/31/22 0500  Weight: 66.3 kg 65.3 kg 65 kg   Vitals:   10/28/22 1341 10/28/22 1927 10/29/22 0524 10/29/22 0834  BP: 106/73 125/82 108/70 94/62   10/29/22 0835 10/29/22 1311 10/29/22 1913 10/30/22 0545  BP: 94/62 91/67 108/73 109/67   10/30/22 0748 10/30/22 1422 10/30/22 1926 10/31/22 0522  BP: 111/72 115/72 117/69 117/80    9. T2DM: Hgb A1C-10.8  (improving >15 @ 11/23). Was on Lantus 15u w/ novolog 12 units with supper>>not restarted             --monitor BS ac/hs and use SSI for elevated BS             --on SSI, Jardiance 10mg  QD (>>25mg  QD) -10/02/22 CBGs well controlled with 2-3U of novolog use per meal so far, monitor for trends  -3/13 increase Jardiance to 25mg   -d/c ISS, discussed good blood sugar control with patient  -Add magnesium gluconate 250mg  HS>> increased to 500mg  QHS  -4/2 Add glipizide 2.5mg  for improved control  -4/5-increased glipizide to 5mg  daily on 4/4   -no further changes until PO picks up  -10/30/22 CBGs improving control, cont  regimen  CBG (last 3)  Recent Labs    10/30/22 1203 10/30/22 1703 10/31/22 0655  GLUCAP 211* 220* 201*    10. Severe obesity: Educate patient on diet, compliance and exercise to help promote health and mobility. Add magnesium gluconate 250mg  HS, increased to 500mg  QHS 11. Acute on Chronic renal failure/AKI:  -4/2 Cr/BUN stable at 1.19/13 yesterday, continue current regimen for now  12. Hyperlipidemia: continue lipitor 80mg  QHS  13. Decreased initiation: increase amantadine to 200mg  daily, continue, appears to be helping, discussed improvements with therapy 14. Vitamin D insufficiency: 21.41 on 10/04/22, continue ergocalciferol 50,000U once per week for 7 weeks. Daily supplement d/ced 15. Bilateral lower extremity throbbing: -10/09/22 SCDs need adjusting, asked nursing to assist; added Robaxin 500mg  q6h PRN for leg spasms -US reviewed and negative for clots, asking nursing to check whether SCDs are working, continue SCDs, asked nursing to apply them -Discussed baclofen but she defers due to side effect of constipation.  -4/5 pain more in LLE today. Continue tylenol, robaxin, prn ms ir 16. Constipation:   Continue miralax BID  -10/18/22 senokot 2 tabs QD  added, plus 2 tabs QD PRN if no BM in 24hrs -Magnesium citrate ordered 3/27 with good effect. Decrease Miralax to daily.  -10/22/22 pt reports LBM yesterday but not documented since 10/19/22; monitor for now, but may need to go back up on Miralax -10/23/22 LBM yesterday, cont regimen  -4/5 still no bm since 3/29---sorbitol today -needs to eat more as well -4/6 will give sorbitol 30cc after therapy, LBM 2-3 days ago and feels constipated. Also increased miralax to BID and added colace 100mg  QD back on -10/30/22 still no BM, would like to avoid enema, wants to try sorbitol [id number] dose today, and increase senokot to 3 tabs QD, monitor for effect;  if no BM tonight, may consider Mag Citrate tomorrow morning since that's been effective before.  17.  Increased tone  -consider baclofen. For now splinting, rom, kpad ordered 18. AKI: torsemide ordered 19. Increased right sided tremor: could be secondary to AKI, hold torsemide    LOS: 30 days A FACE TO FACE EVALUATION WAS PERFORMED  Clint Bolder P Mazell Aylesworth 10/31/2022, 11:35 AM

## 2022-10-31 NOTE — Progress Notes (Signed)
Inpatient Rehabilitation Care Coordinator Discharge Note   Patient Details  Name: [name] MRN: [id number] Date of Birth: [birth year]   Discharge location: HOME WITH DAUGHTER WHO CAN PROVIDE 24/7 CARE  Length of Stay: 31 days  Discharge activity level: MIN WHEELCHAIR LEVEL  Home/community participation: ACTIVE  Patient response WG:[id number] Literacy - How often do you need to have someone help you when you read instructions, pamphlets, or other written material from your doctor or pharmacy?: Never  Patient response YQ:MVHQIO Isolation - How often do you feel lonely or isolated from those around you?: Never  Services provided included: MD, RD, PT, OT, SLP, RN, CM, TR, Pharmacy, Neuropsych, SW  Financial Services:  Field seismologist Utilized: Medicare    Choices offered to/list presented to: PT AND DAUGHTER  Follow-up services arranged:  Home Health, Patient/Family has no preference for HH/DME agencies, DME Home Health Agency: [facility] PT OT SO RN . HAVE INFORMED [facility]ORY-LIAISON FOR [place] DAUGHTER'S ACCURATE PHONE NUMBER [phone]   DME : [facility] BEDSIDE COMMODE AND TUB SEAT    Patient response to transportation need: Is the patient able to respond to transportation needs?: Yes In the past 12 months, has lack of transportation kept you from medical appointments or from getting medications?: No In the past 12 months, has lack of transportation kept you from meetings, work, or from getting things needed for daily living?: No    Comments (or additional information):PT FEELS READY TO GO HOME FAMILY HAS BEEN HERE FOR HANDS ON TRAINING AND AWARE OF 24/7 CARE NEEDS. MULTIPLE FAMILY MEMBERS TO ASSIST  Patient/Family verbalized understanding of follow-up arrangements:  Yes  Individual responsible for coordination of the follow-up plan: [name] [phone]  Confirmed correct DME delivered: [name] 10/31/2022    [name]

## 2022-10-31 NOTE — Progress Notes (Signed)
Inpatient Rehabilitation Discharge Medication Review by a Pharmacist  A complete drug regimen review was completed for this patient to identify any potential clinically significant medication issues.  High Risk Drug Classes Is patient taking? Indication by Medication  Antipsychotic No   Anticoagulant Yes Apixaban-PAF with LV thrombus  Antibiotic No   Opioid Yes Morphine Sulfate-pain  Antiplatelet No   Hypoglycemics/insulin Yes Glipizide, Empagliflozin-DM  Vasoactive Medication Yes Aldactone, Torsemide, losartan-CHF Metoprolol-Afib  Chemotherapy No   Other Yes Senna-docusate, Docusate, Miralax-constipation Pantoprazole-GERD Melatonin, trazadone-sleep Megesterol-appetite Lexapro-depression/mood Ergocalciferol-supplementation Calcium Carbonate-supplementation Amantidine-initiation Methocarbamol-spasms Atorvastatin-HLD Hydroxyzine-anxiety     Type of Medication Issue Identified Description of Issue Recommendation(s)  Drug Interaction(s) (clinically significant)     Duplicate Therapy     Allergy     No Medication Administration End Date     Incorrect Dose     Additional Drug Therapy Needed     Significant med changes from prior encounter (inform family/care partners about these prior to discharge).    Other       Clinically significant medication issues were identified that warrant physician communication and completion of prescribed/recommended actions by midnight of the next day:  No  Name of provider notified for urgent issues identified:   Provider Method of Notification:     Pharmacist comments:   Time spent performing this drug regimen review (minutes):  20   Demarkus Remmel A. Jeanella Craze, PharmD, BCPS, FNKF Clinical Pharmacist [place] Please utilize Amion for appropriate phone number to reach the unit pharmacist [facility])  10/31/2022 10:25 AM

## 2022-11-01 ENCOUNTER — Other Ambulatory Visit (HOSPITAL_COMMUNITY): Payer: Self-pay

## 2022-11-01 LAB — BASIC METABOLIC PANEL
Anion gap: 8 (ref 5–15)
Anion gap: 13 (ref 5–15)
BUN: 21 mg/dL — ABNORMAL HIGH (ref 6–20)
BUN: 25 mg/dL — ABNORMAL HIGH (ref 6–20)
CO2: 24 mmol/L (ref 22–32)
CO2: 26 mmol/L (ref 22–32)
Calcium: 7.2 mg/dL — ABNORMAL LOW (ref 8.9–10.3)
Calcium: 9.5 mg/dL (ref 8.9–10.3)
Chloride: 106 mmol/L (ref 98–111)
Chloride: 96 mmol/L — ABNORMAL LOW (ref 98–111)
Creatinine, Ser: 1.11 mg/dL — ABNORMAL HIGH (ref 0.44–1.00)
Creatinine, Ser: 1.42 mg/dL — ABNORMAL HIGH (ref 0.44–1.00)
GFR, Estimated: >60 mL/min (ref >60)
GFR, Estimated: 47 mL/min — ABNORMAL LOW (ref >60)
Glucose, Bld: 144 mg/dL — ABNORMAL HIGH (ref 70–99)
Glucose, Bld: 171 mg/dL — ABNORMAL HIGH (ref 70–99)
Potassium: 2.6 mmol/L — CL (ref 3.5–5.1)
Potassium: 4.1 mmol/L (ref 3.5–5.1)
Sodium: 138 mmol/L (ref 135–145)
Sodium: 135 mmol/L (ref 135–145)

## 2022-11-01 LAB — GLUCOSE, CAPILLARY
Glucose-Capillary: 154 mg/dL — ABNORMAL HIGH (ref 70–99)
Glucose-Capillary: 152 mg/dL — ABNORMAL HIGH (ref 70–99)

## 2022-11-01 MED ORDER — TORSEMIDE 20 MG PO TABS
20.0000 mg | ORAL_TABLET | Freq: Every day | ORAL | 0 refills | Status: DC
  Filled 2022-11-01: qty 30, 30d supply, fill #0

## 2022-11-01 MED ORDER — LANCET DEVICE MISC: 1.0000 | Freq: Three times a day (TID) | 0 refills | Status: AC

## 2022-11-01 MED ORDER — SPIRONOLACTONE 25 MG PO TABS: 25.0000 mg | ORAL_TABLET | Freq: Every day | ORAL | 0 refills | Status: DC

## 2022-11-01 MED ORDER — LANCETS MISC. MISC: 1.0000 | Freq: Three times a day (TID) | 0 refills | Status: AC

## 2022-11-01 MED ORDER — EMPAGLIFLOZIN 25 MG PO TABS
25.0000 mg | ORAL_TABLET | Freq: Every day | ORAL | 0 refills | Status: DC
  Filled 2022-11-01: qty 30, 30d supply, fill #0

## 2022-11-01 MED ORDER — BLOOD GLUCOSE TEST VI STRP: 1.0000 | ORAL_STRIP | Freq: Three times a day (TID) | 0 refills | Status: AC

## 2022-11-01 MED ORDER — BLOOD GLUCOSE MONITORING SUPPL DEVI: 1.0000 | Freq: Three times a day (TID) | 0 refills | Status: DC

## 2022-11-01 MED ORDER — POTASSIUM CHLORIDE 10 MEQ/100ML IV SOLN
10.0000 meq | Freq: Once | INTRAVENOUS | Status: AC
  Administered 2022-11-01: 10 meq via INTRAVENOUS
  Filled 2022-11-01: qty 100

## 2022-11-01 NOTE — Progress Notes (Signed)
PROGRESS NOTE   Subjective/Complaints: Patient has no new complaints this morning Tremor has improved Discussed creatinine has improved but potassium low, awaiting repeat prior to d/c  ROS: Limited due to cognitive/behavioral, but she denies CP, SOB, abd pain, n/v/d. Tremor improved   Objective:   No results found. Recent Labs    10/31/22 0558  WBC 7.7  HGB 15.4*  HCT 47.7*  PLT 177       Recent Labs    10/31/22 0558 11/01/22 0735  NA 134* 138  K 3.8 2.6*  CL 90* 106  CO2 29 24  GLUCOSE 163* 144*  BUN 25* 21*  CREATININE 1.62* 1.11*  CALCIUM 9.3 7.2*        Intake/Output Summary (Last 24 hours) at 11/01/2022 0950 Last data filed at 10/31/2022 2244 Gross per 24 hour  Intake 1.17 ml  Output --  Net 1.17 ml        Physical Exam: Vital Signs Blood pressure 125/69, pulse 65, temperature 98.1 F (36.7 C), resp. rate 15, height 4\' 11"  (1.499 m), weight 60.8 kg, SpO2 100 %.  Constitutional: No distress . Vital signs reviewed. BMI 27.07 HEENT: NCAT, EOMI, oral membranes moist Neck: supple Cardiovascular: RRR without murmur. No JVD    Respiratory/Chest: CTA Bilaterally without wheezes or rales. Normal effort    GI/Abdomen: BS +, very mildly TTP generalized throughout, non-distended Ext: no clubbing, cyanosis, or edema Psych: very flat but cooperative, more subdued today Skin: dry, warm  PRIOR EXAMS: MSK: LLE was painful along lower leg when I passively ranged it.  Normal PROM Neurologic: Alert, makes eye contact. Left central 7.  Speech dysarthric, low volume.  Minimal verbal output.  Language appears to be intact. Left inattention. MILDLY increased tone LUE (flexor at elbow) and LLE---but generally easy ROM. LUE and LLE 0/5 with decreased sensation to LT/pain on left.      Assessment/Plan: 1. Functional deficits which require 3+ hours per day of interdisciplinary therapy in a comprehensive  inpatient rehab setting. Physiatrist is providing close team supervision and 24 hour management of active medical problems listed below. Physiatrist and rehab team continue to assess barriers to discharge/monitor patient progress toward functional and medical goals  Care Tool:  Bathing    Body parts bathed by patient: Left arm, Chest, Abdomen, Front perineal area, Right upper leg, Left upper leg, Face, Right lower leg, Left lower leg, Buttocks   Body parts bathed by helper: Right arm     Bathing assist Assist Level: Minimal Assistance - Patient > 75%     Upper Body Dressing/Undressing Upper body dressing   What is the patient wearing?: Pull over shirt    Upper body assist Assist Level: Minimal Assistance - Patient > 75%    Lower Body Dressing/Undressing Lower body dressing      What is the patient wearing?: Incontinence brief, Pants     Lower body assist Assist for lower body dressing: Moderate Assistance - Patient 50 - 74%     Toileting Toileting Toileting Activity did not occur (Clothing management and hygiene only): N/A (no void or bm)  Toileting assist Assist for toileting: Minimal Assistance - Patient > 75%  Transfers Chair/bed transfer  Transfers assist  Chair/bed transfer activity did not occur: Safety/medical concerns  Chair/bed transfer assist level: Minimal Assistance - Patient > 75% (stand pivot or squat pivot)     Locomotion Ambulation   Ambulation assist   Ambulation activity did not occur: Safety/medical concerns  Assist level: 2 helpers Assistive device: Fara Boros Max distance: 50 feet   Walk 10 feet activity   Assist  Walk 10 feet activity did not occur: Safety/medical concerns  Assist level: Maximal Assistance - Patient 25 - 49%     Walk 50 feet activity   Assist Walk 50 feet with 2 turns activity did not occur: Safety/medical concerns  Assist level: Maximal Assistance - Patient 25 - 49%      Walk 150 feet  activity   Assist Walk 150 feet activity did not occur: Safety/medical concerns         Walk 10 feet on uneven surface  activity   Assist Walk 10 feet on uneven surfaces activity did not occur: Safety/medical concerns         Wheelchair     Assist Is the patient using a wheelchair?: Yes Type of Wheelchair: Manual Wheelchair activity did not occur: Safety/medical concerns  Wheelchair assist level: Maximal Assistance - Patient 25 - 49%      Wheelchair 50 feet with 2 turns activity    Assist    Wheelchair 50 feet with 2 turns activity did not occur: Safety/medical concerns   Assist Level: Maximal Assistance - Patient 25 - 49%   Wheelchair 150 feet activity     Assist  Wheelchair 150 feet activity did not occur: Safety/medical concerns       Blood pressure 125/69, pulse 65, temperature 98.1 F (36.7 C), resp. rate 15, height  (1.499 m), weight 60.8 kg, SpO2 100 %.    Medical Problem List and Plan: 1. 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  Antithrombotics: -DVT/anticoagulation:  Pharmaceutical: Eliquis  BID             -antiplatelet therapy: N/A 3. Back pain/Pain Management:  Continue magnesium  HS (>>500mg  QHS). Tylenol prn. Decrease MSIR  to q8H prn  4. Chronic anxiety, Insomnia: Resumed home atarax  TID PRN             -continue lexapro  qhs             -neuropsych eval -10/02/22 started Trazodone  QHS and Hydroxyzine  QHS for now, monitor for effect or oversedation; left Hydroxyzine  TID PRN dosing for anxiety in place  -10/08/22 added Melatonin  QHS  -4/3 increased lexapro to  for decreased mood/anxiety -4/5 I suspect pt's affect is partially due to right MCA infarct  5.  Neuropsych/cognition: This patient is not capable of making decisions on her own behalf.  -on amantadine for initiation/arousal 6. Skin/Wound Care:  Routine pressure relief  measures.  7. Fluids/Electrolytes/Nutrition: intake is poor.  -4/5 will begin trial of megace  BID  -10/30/22 still with poor appetite, eating very little 8. Acute on chronic combined CHF: HH diet.  -Monitor weights daily  and for signs of overload -continue Torsemide 40mg  QD (>>20mg  QD), Losartan 50mg  QD, Spironolactone 25mg  QD, Jardiance 10mg  QD (>>25mg  QD), and Metoprolol 25mg  BID  --Increase torsemide back to 20mg  daily -4/2 no signs of overload, weight a little up continue to monitor, continue current regimen -4/5 weight has steadily fallen from 3/9 (86kg) to today (64kg)--unfortunately this is also due to poor nutrition (see #7) -10/30/22 wt stable at new baseline, no fluid overload on exam Filed Weights   10/30/22 0500 10/31/22 0500 11/01/22 0500  Weight: 65.3 kg 65 kg 60.8 kg   Vitals:   10/29/22 0834 10/29/22 0835 10/29/22 1311 10/29/22 1913  BP: 94/62 94/62 91/67  108/73   10/30/22 0545 10/30/22 0748 10/30/22 1422 10/30/22 1926  BP: 109/67 111/72 115/72 117/69   10/31/22 0522 10/31/22 1347 10/31/22 1955 11/01/22 0636  BP: 117/80 110/70 (!) 154/99 125/69    9. T2DM: Hgb A1C-10.8  (improving >15 @ 11/23). Was on Lantus 15u w/ novolog 12 units with supper>>not restarted             --monitor BS ac/hs and use SSI for elevated BS             --on SSI, Jardiance 10mg  QD (>>25mg  QD) -10/02/22 CBGs well controlled with 2-3U of novolog use per meal so far, monitor for trends  -3/13 increase Jardiance to 25mg   -d/c ISS, discussed good blood sugar control with patient  -Add magnesium gluconate 250mg  HS>> increased to 500mg  QHS  -4/2 Add glipizide 2.5mg  for improved control  -4/5-increased glipizide to 5mg  daily on 4/4   -no further changes until PO picks up  -10/30/22 CBGs improving control, cont regimen  CBG  (last 3)  Recent Labs    10/31/22 1655 10/31/22 2054 11/01/22 0631  GLUCAP 172* 235* 154*    10. Severe obesity: Educate patient on diet, compliance and exercise to help promote health and mobility. Add magnesium gluconate 250mg  HS, increased to 500mg  QHS 11. Acute on Chronic renal failure/AKI:  -4/2 Cr/BUN stable at 1.19/13 yesterday, continue current regimen for now  12. Hyperlipidemia: continue lipitor 80mg  QHS  13. Decreased initiation: increase amantadine to 200mg  daily, continue, appears to be helping, discussed improvements with therapy 14. Vitamin D insufficiency: 21.41 on 10/04/22, continue ergocalciferol 50,000U once per week for 7 weeks. Daily supplement d/ced 15. Bilateral lower extremity throbbing: -10/09/22 SCDs need adjusting, asked nursing to assist; added Robaxin 500mg  q6h PRN for leg spasms -[place] reviewed and negative for clots, asking nursing to check whether SCDs are working, continue SCDs, asked nursing to apply them -Discussed baclofen but she defers due to side effect of constipation.  -4/5 pain more in LLE today. Continue tylenol, robaxin, prn ms ir 16. Constipation:   Continue miralax BID  -10/18/22 senokot 2 tabs QD  added, plus 2 tabs QD PRN if no BM in 24hrs -Magnesium citrate ordered 3/27 with good effect. Decrease Miralax to daily.  -10/22/22 pt reports LBM yesterday but not documented since 10/19/22; monitor for now, but may need to go back up on Miralax -10/23/22 LBM yesterday, cont regimen  -4/5 still no bm since 3/29---sorbitol today -needs to eat more as well -4/6 will give sorbitol 30cc after therapy, LBM 2-3 days ago and feels constipated. Also increased miralax to BID and added colace 100mg  QD back on -10/30/22 still no BM, would like to avoid enema, wants to try sorbitol 58ml dose today, and increase senokot to 3 tabs QD, monitor for effect; if no BM tonight, may consider  Mag Citrate tomorrow morning since that's been effective before.  17. Increased  tone  -will not add baclofen given her constipation, patient does not want. For now splinting, rom, kpad ordered 18. AKI: torsemide held, improved 19. Increased right sided tremor: improved with resolution of AKI 20. Hypokalemia: IV potassium ordered and repeat potassium level ordered   >30 minutes spent in discharge of patient including review of medications and follow-up appointments, physical examination, and in answering all patient's questions     LOS: 31 days A FACE TO FACE EVALUATION WAS PERFORMED  Rochell Puett P Avana Kreiser 11/01/2022, 9:50 AM

## 2022-11-02 ENCOUNTER — Ambulatory Visit: Payer: Medicare Other | Admitting: Internal Medicine

## 2022-11-02 ENCOUNTER — Telehealth: Payer: Self-pay

## 2022-11-02 NOTE — Telephone Encounter (Signed)
Transitional care call done by another clinic

## 2022-11-02 NOTE — Transitions of Care (Post Inpatient/ED Visit) (Unsigned)
   11/02/2022  Name: [name] MRN: [id number] DOB: [birth year]  Today's TOC FU Call Status: Today's TOC FU Call Status:: Unsuccessul Call (1st Attempt) Unsuccessful Call (1st Attempt) Date: 11/02/22  Attempted to reach the patient regarding the most recent Inpatient/ED visit.  Follow Up Plan: Additional outreach attempts will be made to reach the patient to complete the Transitions of Care (Post Inpatient/ED visit) call.   Signature Karena Addison, LPN [facility] Nurse Health Advisor Direct Dial [phone]

## 2022-11-03 NOTE — Transitions of Care (Post Inpatient/ED Visit) (Signed)
   11/03/2022  Name: [name] MRN: [id number] DOB: 01-Apr-1976  Today's TOC FU Call Status: Today's TOC FU Call Status:: Successful TOC FU Call Competed Unsuccessful Call (1st Attempt) Date: 11/02/22 [facility] FU Call Complete Date: 11/03/22  Transition Care Management Follow-up Telephone Call Date of Discharge: 11/01/22 Discharge Facility: Other ([facility]) Name of Other ([facility]) Discharge Facility: [facility] Type of Discharge: Inpatient Admission Primary Inpatient Discharge Diagnosis:: cerebral infarction How have you been since you were released from the hospital?: Better Any questions or concerns?: No  Items Reviewed: Did you receive and understand the discharge instructions provided?: Yes Medications obtained and verified?: Yes (Medications Reviewed) Dietary orders reviewed?: Yes Do you have support at home?: Yes People in Home: spouse  Home Care and Equipment/Supplies: Were Home Health Services Ordered?: No Any new equipment or medical supplies ordered?: NA  Functional Questionnaire: Do you need assistance with bathing/showering or dressing?: Yes Do you need assistance with meal preparation?: Yes Do you need assistance with eating?: No Do you have difficulty maintaining continence: No Do you need assistance with getting out of bed/getting out of a chair/moving?: No Do you have difficulty managing or taking your medications?: Yes  Follow up appointments reviewed: PCP Follow-up appointment confirmed?: Yes Date of PCP follow-up appointment?: 11/09/22 Follow-up Provider: Dr Robyne Peers [facility] Follow-up appointment confirmed?: NA Do you need transportation to your follow-up appointment?: No Do you understand care options if your condition(s) worsen?: Yes-patient verbalized understanding    SIGNATURE Karena Addison, LPN [facility] Nurse Health Advisor Direct Dial [phone]

## 2022-11-09 ENCOUNTER — Inpatient Hospital Stay: Payer: Medicare Other | Admitting: Family Medicine

## 2022-11-09 ENCOUNTER — Encounter: Payer: Self-pay | Admitting: Internal Medicine

## 2022-11-09 ENCOUNTER — Encounter: Payer: Self-pay | Admitting: Family Medicine

## 2022-11-09 ENCOUNTER — Ambulatory Visit: Payer: Medicare Other | Admitting: Family Medicine

## 2022-11-09 ENCOUNTER — Ambulatory Visit: Payer: Medicare Other | Attending: Internal Medicine | Admitting: Internal Medicine

## 2022-11-09 VITALS — BP 107/63 | HR 75

## 2022-11-09 VITALS — BP 99/65 | HR 73 | Ht 59.0 in | Wt 134.0 lb

## 2022-11-09 DIAGNOSIS — I5042 Chronic combined systolic (congestive) and diastolic (congestive) heart failure: Secondary | ICD-10-CM | POA: Insufficient documentation

## 2022-11-09 DIAGNOSIS — Z91199 Patient's noncompliance with other medical treatment and regimen due to unspecified reason: Secondary | ICD-10-CM

## 2022-11-09 NOTE — Patient Instructions (Signed)
Medication Instructions:  No changes, continue taking medications *If you need a refill on your cardiac medications before your next appointment, please call your pharmacy*  Follow-Up: At [facility], you and your health needs are our priority.  As part of our continuing mission to provide you with exceptional heart care, we have created designated Provider Care Teams.  These Care Teams include your primary Cardiologist (physician) and Advanced Practice Providers (APPs -  Physician Assistants and Nurse Practitioners) who all work together to provide you with the care you need, when you need it.  We recommend signing up for the patient portal called "MyChart".  Sign up information is provided on this After Visit Summary.  MyChart is used to connect with patients for Virtual Visits (Telemedicine).  Patients are able to view lab/test results, encounter notes, upcoming appointments, etc.  Non-urgent messages can be sent to your provider as well.   To learn more about what you can do with MyChart, go to [email or web address].    Your next appointment:   6 month(s)  Provider:   Maisie Fus, MD

## 2022-11-09 NOTE — Progress Notes (Signed)
Encounter opened initially but patient would like to reschedule as they have another appointment earlier this morning. Patient will reschedule at their earliest convenience for a hospital follow up.

## 2022-11-09 NOTE — Progress Notes (Unsigned)
Cardiology Office Note:    Date:  11/10/2022   ID:  [name], DOB [birth year], MRN [id number]  PCP:  Tammy Leader, DO   [facility] Providers Cardiologist:  Tammy Fus, MD Electrophysiologist:  Tammy Lemming, MD     Referring MD: Tammy Leader, DO   No chief complaint on file. Hospital FU  History of Present Illness:    [name] is a 47 y.o. 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%) felt to be nonischemic cardiomyopathy, paroxysmal atrial fibrillation on Xarelto, type 2 DM, anemia, HTN. Shewas initially diagnosed with nonischemic cardiomyopathy in 12/2018. Echocardiogram at that time showed EF 20-25%, diastolic function could not be evaluated as patient was in atrial fibrillation at the time of the study. She was seen by EP in 12/2018 as there were concerns that her cardiomyopathy was tachy induced. Patient was started on GDMT and her afib regiment was optimized. Follow up echocardiogram 3 months later in 03/2019 showed EF 20-25%,  diastolic parameters consistent with restrictive filling, LV diffuse hypokinesis, apical thrombus, moderate RV systolic dysfunction, moderate TR, mild MR. As her EF had continued to be low, she underwent R/L heart catheterization on 03/29/2019 to rule out ischemia. Cath showed no obstructive CAD, R>L heart failure with low PAPI suggesting significant RV dysfunction, primarily pulmonary venous HTN, low cardiac output, CI 2.07. Patient was started on coumadin for LV thrombus. She was following with the HF clinic.  After her cath, a follow up echo in 09/2019 showed EF <20%, moderate LVH, grade II diastolic dysfunction, severe RV dysfunction, moderately elevated pulmonary artery systolic pressure. Echo from 12/2019 did show some improvement in EF to 35-40%, grade I DD.    Patient was admitted to the hospital in 08/2021 for evaluation of CHF and chest pain. Echo from 09/18/21 showed EF 20%, grade III diastolic  dysfunction, elevated LVEDP, severely reduced RV systolic function, moderately elevated PA systolic pressure, mild MR. Underwent R/L heart catheterization on 09/20/21 that showed mild, nonobstructive CAD, nonischemic cardiomyopathy, elevated right and left heart filling pressures, preserved cardiac output, pulmonary venous htn. At that time, Dr. Shirlee [name] believed that her cardiomyopathy could be caused by poorly controlled DM or HTN. Patient underwent Cardiac MRI to rule out myocarditis on 09/20/21. Study showed mildly dilated LV with EF 23%, diffuse hypokinesis with inferior akinesis, moderate RV systolic dysfunction with EF 32%, no definitive evidence of prior MI, infiltrative disease, myocarditis. LV thrombus was not seen on cMRI, continued on coumadin for prevention. Later, patient was transitioned to Xarelto due to poor compliance with coumadin and subtherapeutic INRs. She was reportedly rude to staff and was dismissed from HF clinic.   She's had recurrent admissions thought 2/2 difficulty with medication compliance.   Patient was last seen by cardiology on 02/02/22 for an outpatient appointment. At that time, patient admitted to medication noncompliance. She had visited the ER for diffuse body pain and CHF, but she left before being seen. Encouraged medication compliance and diuretic use. She underwent CT chest negative for acute pulmonary embolus, ovoid hypodensity/filling defect within the left ventricular apex (needs echo to exclude LV thrombus), cardiomegaly with reflux of contrast into the hepatic veins consistent with elevated right heart pressures, heterogenous enhancement of the liver likely due to passive congestion. A repeat echo 02/23/2022 showed 2.2 x 1.4 cm thrombus in the LV with EF < 20%. RV function was moderately reduced. RVSP 44 mmhg. Mild MR. She was diuresed and continued on xarelto. Plan  was for to ensure FU before starting coumadin again, since clot remained on DOAC   Today, She is having  chest pressure under the breast. It feels like a pin. She has no PND/orthopnea. No LE edema.    Interim hx 09/08/2022 INR has been 2-3 for a month. Notes leg swelling.   Interim hx 11/09/2022 Unfortunately she has missed a few visits.  Since I last saw her, she presented in March with SOB. INR was subtherapeutic. Plan was then to switch back to DOAC. During her stay she had an acute change in her mental status on 3/2. She had left sided weakness and a right gaze deviation. Did not get TPA 2/2 being on heparin. Her MRI showed a large acute infarct in the right MCA, and smaller area of acute infarct in the right ACA. Persistent occlusion of a right M2 [name]. She underwent a mechanical thrombectomy for proximal right M2 occlusion. She was managed in the neuro- ICU. Family was at the bedside.   Her left side is weak , she is in a wheel chair. She denies CP or shortness of breath. She is taking her medications.  No bleeding.   Past Medical History:  Diagnosis Date   Abnormal Pap smear    Acute combined systolic and diastolic heart failure 02/19/2019   Wt Readings from Last 3 Encounters:  09/18/21  84.7 kg  09/02/21  84.4 kg  08/31/21  84.2 kg         Acute heart failure 01/29/2022   Acute on chronic combined systolic and diastolic CHF (congestive heart failure) 02/19/2019   Acute on chronic congestive heart failure    Acute on chronic congestive heart failure    Acute on chronic diastolic CHF (congestive heart failure) 10/19/2021   Acute on chronic systolic heart failure 02/22/2022   Anemia    Asthma    Atrial fibrillation    history of paroxysmal atrial fibrillation   CHF (congestive heart failure) 11/02/2021   CHF exacerbation 10/17/2021   Diabetes mellitus    DKA, type 2 02/06/2019   Elevated troponin 09/30/2019   Heart failure with reduced ejection fraction    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  Consider chronic  etiology given continued fertility issues   Hypertension    Hypertensive emergency 09/30/2019   Hypertensive urgency 06/02/2022   Major depressive disorder 02/08/2012   Reports that 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     Migraines    Morbid obesity 02/06/2019   Nausea & vomiting 06/26/2022   Nonischemic cardiomyopathy 09/22/2021   NSTEMI (non-ST elevated myocardial infarction) 02/23/2022   Obstructive sleep apnea 06/30/2015   Polycystic ovarian disease     Past Surgical History:  Procedure Laterality Date   BIOPSY  11/05/2021   Procedure: BIOPSY;  Surgeon: Jenel Lucks, MD;  Location: [facility] ENDOSCOPY;  Service: Gastroenterology;;   CERVIX LESION DESTRUCTION     CESAREAN SECTION     ESOPHAGOGASTRODUODENOSCOPY (EGD) WITH PROPOFOL N/A 11/05/2021   Procedure: ESOPHAGOGASTRODUODENOSCOPY (EGD) WITH PROPOFOL;  Surgeon: Jenel Lucks, MD;  Location: [facility] ENDOSCOPY;  Service: Gastroenterology;  Laterality: N/A;   IR CT HEAD LTD  09/24/2022   IR PERCUTANEOUS ART THROMBECTOMY/INFUSION INTRACRANIAL INC DIAG ANGIO  09/24/2022   RADIOLOGY WITH ANESTHESIA N/A 09/24/2022   Procedure: IR WITH ANESTHESIA;  Surgeon: Radiologist, Medication, MD;  Location: [facility] OR;  Service: Radiology;  Laterality: N/A;   RIGHT/LEFT HEART CATH AND CORONARY ANGIOGRAPHY N/A 03/29/2019   Procedure: RIGHT/LEFT HEART CATH AND CORONARY ANGIOGRAPHY;  Surgeon: Laurey Morale, MD;  Location: [facility] INVASIVE CV LAB;  Service: Cardiovascular;  Laterality: N/A;   RIGHT/LEFT HEART CATH AND CORONARY ANGIOGRAPHY N/A 09/20/2021   Procedure: RIGHT/LEFT HEART CATH AND CORONARY ANGIOGRAPHY;  Surgeon: Laurey Morale, MD;  Location: [facility] INVASIVE CV LAB;  Service: Cardiovascular;  Laterality: N/A;    Current Medications: Current Meds  Medication Sig   Accu-Chek Softclix Lancets lancets Use up 4 (four) times daily as directed   acetaminophen (TYLENOL) 500 MG tablet Take 500-1,000 mg  by mouth every 6 (six) hours as needed for mild pain or headache.   amantadine (SYMMETREL) 100 MG capsule Take 2 capsules (200 mg total) by mouth daily.   apixaban (ELIQUIS) 5 MG TABS tablet Take 1 tablet (5 mg total) by mouth 2 (two) times daily.   atorvastatin (LIPITOR) 80 MG tablet Take 1 tablet (80 mg total) by mouth daily after supper.   blood glucose meter kit and supplies Use up to 4 (four) times daily as directed   Blood Glucose Monitoring Suppl DEVI 1 each by Does not apply route in the morning, at noon, and at bedtime. May substitute to any manufacturer covered by patient's insurance.   calcium carbonate (TUMS - DOSED IN MG ELEMENTAL CALCIUM) 500 MG chewable tablet Chew 1 tablet (200 mg of elemental calcium total) by mouth daily after supper.   docusate sodium (COLACE) 100 MG capsule Take 1 capsule (100 mg total) by mouth daily.   empagliflozin (JARDIANCE) 25 MG TABS tablet Take 1 tablet (25 mg total) by mouth daily.   escitalopram (LEXAPRO) 10 MG tablet Take 1 tablet (10 mg total) by mouth at bedtime.   glipiZIDE (GLUCOTROL) 5 MG tablet Take 1 tablet (5 mg total) by mouth daily before breakfast.   Glucose Blood (BLOOD GLUCOSE TEST STRIPS) STRP Inject 1 each into the skin 4 (four) times daily -  before meals and at bedtime. May substitute to any manufacturer covered by patient's insurance.   glucose blood (ONETOUCH VERIO) test strip Use up to 4 (four) times daily as directed   hydrOXYzine (ATARAX) 10 MG tablet Take 1 tablet (10 mg total) by mouth 3 (three) times daily as needed.   Lancet Device MISC 1 each by Does not apply route in the morning, at noon, and at bedtime. May substitute to any manufacturer covered by patient's insurance.   Lancets Misc. MISC 1 each by Does not apply route in the morning, at noon, and at bedtime. May substitute to any manufacturer covered by patient's insurance.   losartan (COZAAR) 50 MG tablet Take 1 tablet (50 mg total) by mouth daily.   megestrol (MEGACE)  400 MG/10ML suspension Take 10 mLs (400 mg total) by mouth 2 (two) times daily.   melatonin 5 MG TABS Take 1 tablet (5 mg total) by mouth at bedtime.   Menthol-Methyl Salicylate (MUSCLE RUB) 10-15 % CREA Apply 1 Application topically 3 (three) times daily.   methocarbamol (ROBAXIN) 500 MG tablet Take 1 tablet (500 mg total) by mouth every 6 (six) hours as needed for muscle spasms.   metoprolol tartrate (LOPRESSOR) 25 MG tablet Take 1 tablet (25 mg total) by mouth 2 (two) times daily.   morphine (MSIR) 15 MG tablet Take 1 tablet (15 mg total) by mouth every 8 (eight) hours as needed for severe pain.   Multiple Vitamin (MULTIVITAMIN  WITH MINERALS) TABS tablet Take 1 tablet by mouth daily.   pantoprazole (PROTONIX) 40 MG tablet Take 1 tablet (40 mg total) by mouth at bedtime.   polyethylene glycol powder (GLYCOLAX/MIRALAX) 17 GM/SCOOP powder Take 1 capful (17 g) by mouth 2 (two) times daily.   senna-docusate (SENOKOT-S) 8.6-50 MG tablet Take 3 tablets by mouth daily with lunch.   spironolactone (ALDACTONE) 25 MG tablet Take 1 tablet (25 mg total) by mouth daily.   torsemide (DEMADEX) 20 MG tablet Take 1 tablet (20 mg total) by mouth daily.   traZODone (DESYREL) 50 MG tablet Take 0.5 tablets (25 mg total) by mouth at bedtime.   Vitamin D, Ergocalciferol, (DRISDOL) 1.25 MG (50000 UNIT) CAPS capsule Take 1 capsule (50,000 Units total) by mouth every 7 (seven) days.     Allergies:   Fish allergy, Shellfish allergy, Metformin and related, Trulicity [dulaglutide], Nsaids, and Advil [ibuprofen]   Social History   Socioeconomic History   Marital status: Single    Spouse name: Not on file   Number of children: Not on file   Years of education: Not on file   Highest education level: Not on file  Occupational History   Not on file  Tobacco Use   Smoking status: Never   Smokeless tobacco: Never   Tobacco comments:    Never smoke 10/22/21  Vaping Use   Vaping Use: Never used  Substance and Sexual  Activity   Alcohol use: No    Alcohol/week: 0.0 standard drinks of alcohol   Drug use: No   Sexual activity: Yes  Other Topics Concern   Not on file  Social History Narrative   Not on file   Social Determinants of Health   Financial Resource Strain: High Risk (10/17/2019)   Overall Financial Resource Strain (CARDIA)    Difficulty of Paying Living Expenses: Hard  Food Insecurity: No Food Insecurity (09/23/2022)   Hunger Vital Sign    Worried About Running Out of Food in the Last Year: Never true    Ran Out of Food in the Last Year: Never true  Transportation Needs: No Transportation Needs (09/23/2022)   PRAPARE - Administrator, Civil Service (Medical): No    Lack of Transportation (Non-Medical): No  Recent Concern: Transportation Needs - Unmet Transportation Needs (08/07/2022)   PRAPARE - Administrator, Civil Service (Medical): Yes    Lack of Transportation (Non-Medical): No  Physical Activity: Insufficiently Active (08/19/2020)   Exercise Vital Sign    Days of Exercise per Week: 7 days    Minutes of Exercise per Session: 20 min  Stress: Stress Concern Present (08/19/2020)   [employer] of Occupational Health - Occupational Stress Questionnaire    Feeling of Stress : Very much  Social Connections: Not on file     Family History: The patient's family history includes Diabetes in her father and mother; Hypertension in her father and mother.  ROS:   Please see the history of present illness.     All other systems reviewed and are negative.  EKGs/Labs/Other Studies Reviewed:    The following studies were reviewed today:   EKG:    11/09/2022- NSR, LAD, LVH  Recent Labs: 09/22/2022: B Natriuretic Peptide 688.8 09/24/2022: TSH 3.269 09/27/2022: Magnesium 2.3 10/03/2022: ALT 15 10/31/2022: Hemoglobin 15.4; Platelets 177 11/01/2022: BUN 25; Creatinine, Ser 1.42; Potassium 4.1; Sodium 135   Recent Lipid Panel    Component Value Date/Time   CHOL 136  09/24/2022 0130  CHOL 266 (H) 10/20/2017 1054   TRIG 63 09/28/2022 0456   HDL 27 (L) 09/24/2022 0130   HDL 43 10/20/2017 1054   CHOLHDL 5.0 09/24/2022 0130   VLDL 12 09/24/2022 0130   LDLCALC 97 09/24/2022 0130   LDLCALC 195 (H) 10/20/2017 1054   LDLDIRECT 154 (H) 01/20/2014 1522     Risk Assessment/Calculations:        Physical Exam:    VS:  BP 99/65   Pulse 73   Ht 4\' 11"  (1.499 m)   Wt 134 lb 0.6 oz (60.8 kg)   SpO2 96%   BMI 27.07 kg/m     Wt Readings from Last 3 Encounters:  11/09/22 134 lb 0.6 oz (60.8 kg)  11/01/22 134 lb 0.6 oz (60.8 kg)  10/01/22 198 lb 3.1 oz (89.9 kg)     GEN:  Well nourished, well developed in no acute distress HEENT: Normal NECK: No sig JVD LYMPHATICS: No lymphadenopathy CARDIAC: RRR, no murmurs, rubs, gallops RESPIRATORY:  Clear to auscultation without rales, wheezing or rhonchi  ABDOMEN: Soft, non-tender, non-distended MUSCULOSKELETAL:  + LE edema SKIN: Warm and dry NEUROLOGIC:  Alert and oriented x 3 PSYCHIATRIC:  flat affect   ASSESSMENT:    NYHA Class II-III Non Ischemic CM: EF 15%. Will continue GDMT.  Unlikely ICD candidate with persistent LV thrombus. Not currently ADHF candidate with prior dismissal and lack of medication compliance. Her family is helping her take her medications now. Has low PAPI LVAD not optimal. Again with her hx and s/p stroke/DM2 poorly controlled, unlikely a candidate for transplant. We discussed that she is not an ADHF candidate at this time - euvolemic -She had low Bps in the past with entresto.  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 on jardiance 25 mg daily  PLAN:    In order of problems listed above:    Return in 6 months      Medication Adjustments/Labs and Tests Ordered: Current medicines are reviewed at length with the patient today.  Concerns regarding medicines are outlined above.  Orders Placed This Encounter  Procedures   EKG 12-Lead   No orders of the defined types were placed in this encounter.   Patient Instructions  Medication Instructions:  No changes, continue taking medications *If you need a refill on your cardiac medications before your next appointment, please call your pharmacy*  Follow-Up: At [facility], you and your health needs are our priority.  As part of our continuing mission to provide you with exceptional heart care, we have created designated Provider Care Teams.  These Care Teams include your primary Cardiologist (physician) and Advanced Practice Providers (APPs -  Physician Assistants and Nurse Practitioners) who all work together to provide you with the care you need, when you need it.  We recommend signing up for the patient portal called "MyChart".  Sign up information is provided on this After Visit Summary.  MyChart is used to connect with patients for Virtual Visits (Telemedicine).  Patients are able to view lab/test results, encounter notes, upcoming appointments, etc.  Non-urgent messages  can be sent to your provider as well.   To learn more about what you can do with MyChart, go to [email or web address].    Your next appointment:   6 month(s)  Provider:   Maisie Fus, MD        Signed, Tammy Fus, MD  11/10/2022 9:03 AM    [facility]

## 2022-11-11 ENCOUNTER — Other Ambulatory Visit (HOSPITAL_COMMUNITY): Payer: Self-pay

## 2022-11-11 ENCOUNTER — Ambulatory Visit (HOSPITAL_COMMUNITY)
Admission: RE | Admit: 2022-11-11 | Discharge: 2022-11-11 | Disposition: A | Discharge status: Home / Self Care | Payer: Medicare Other | Source: Ambulatory Visit | Attending: Family Medicine | Admitting: Family Medicine

## 2022-11-11 ENCOUNTER — Ambulatory Visit (INDEPENDENT_AMBULATORY_CARE_PROVIDER_SITE_OTHER): Payer: Medicare Other | Admitting: Student

## 2022-11-11 ENCOUNTER — Encounter: Payer: Self-pay | Admitting: Student

## 2022-11-11 VITALS — BP 102/61 | HR 81 | Ht 59.0 in

## 2022-11-11 DIAGNOSIS — G894 Chronic pain syndrome: Secondary | ICD-10-CM

## 2022-11-11 DIAGNOSIS — M79605 Pain in left leg: Secondary | ICD-10-CM | POA: Insufficient documentation

## 2022-11-11 DIAGNOSIS — M79662 Pain in left lower leg: Secondary | ICD-10-CM | POA: Diagnosis not present

## 2022-11-11 DIAGNOSIS — I10 Essential (primary) hypertension: Secondary | ICD-10-CM | POA: Diagnosis present

## 2022-11-11 DIAGNOSIS — M25512 Pain in left shoulder: Secondary | ICD-10-CM

## 2022-11-11 DIAGNOSIS — I63411 Cerebral infarction due to embolism of right middle cerebral artery: Secondary | ICD-10-CM

## 2022-11-11 MED ORDER — GABAPENTIN 100 MG PO CAPS
100.0000 mg | ORAL_CAPSULE | Freq: Every day | ORAL | 0 refills | Status: DC
Start: 2022-11-11 — End: 2022-12-06
  Filled 2022-11-11: qty 30, 30d supply, fill #0

## 2022-11-11 NOTE — Progress Notes (Cosign Needed Addendum)
SUBJECTIVE:   CHIEF COMPLAINT / HPI: Hospital follow-up  Admitted multiple times this year First admission was 08/05/2022 for acute on chronic heart failure Readmitted on 09/02/2022 to 10/01/2022 for acute on chronic heart failure On 3/2 she developed sudden onset left-sided weakness Admitted from 10/01/2022 to 11/01/2022 for acute ischemic right MCA stroke-was in neuro ICU-from known left ventricular thrombus, etiology likely embolic  Subtherapeutic INR on warfarin, questionable compliance-was switched back to DOAC Eliquis 5 mg twice daily Did have brief cardiac arrest during IR procedure with 2 minutes of ACLS b/f ROSC Left-sided weakness and right gaze deviation-did not get tPA secondary to being on heparin-currently on wheelchair  Discharge Instructions: Discharged from hospital on 10/01/2022, discharged from inpatient rehab on 11/01/2022 Recommend repeat BMET 04/10 to monitor for stability and input on further adjustment of cardiac meds on follow up with cardiology. -She followed up with cardiology and got a BMP on 4/9 and potassium had normalized.  Saw cardiology on 4/17-recommended continuing metoprolol, losartan, spironolactone, Jardiance but had low blood pressures with Entresto so not taking this-obtain BMP today 2.    Monitor blood sugars before  meals and at bedtime-last A1c was 10.8.  Continuing Jardiance 25 mg daily, glipizide 5 mg daily.  Does not check sugars  Home health PT has not come yet-[facility]-will place another referral today  Left Shoulder Pain The left shoulder pain has been going on since being in the hospital.  She says that the nurses and rehab often times when bathing her would displace her left arm a lot without regards for where it goes and daughters are wondering whether this may have something to do with the shoulder pain.  Pain is been similar since hospitalization.  Denies any shocks down her arms mostly concentrated around her left shoulder.  Unable to move this arm  very much and daughter says that she does not let her touch this area very much.  Denies any systemic symptoms.  Left Leg Pain A lot of pain in toes, and behind left leg- sometimes pins and needles sensation, sometimes just dull pain.  Says the morphine that was started in the hospital did not help very much but she is still taking at nighttime.  Wanting additional pain control today.  Denies any swelling.  Denies any missed doses of DOAC.  PERTINENT  PMH / PSH: HF-EF 15%, hypertension, HLD, poorly controlled T2DM, PAF on Eliquis OBJECTIVE:   BP 102/61   Pulse 81   Ht  (1.499 m)   SpO2 98%   BMI 27.07 kg/m   General: NAD, awake, alert, responsive to questions Head: Normocephalic atraumatic; full range of motion of neck without pain CV: Regular rate and rhythm no murmurs rubs or gallops Respiratory: Clear to ausculation bilaterally, no wheezes rales or crackles, chest rises symmetrically,  no increased work of breathing Extremities: Left sided hemiplegia arm and left leg, very tender to light palpation everywhere left shoulder, no overlying skin changes or swelling, slightly able to raise it back is not able to tolerate me examining further; extremely tender to light palpation of left calf, no swelling or erythema; extremities NVI  ASSESSMENT/PLAN:   Left leg pain Mobilization with extreme tenderness over left calf.  Will evaluate for acute DVT.  Was started on morphine in the hospital-I would like to avoid long-term narcotics in this patient given multiple medications that can decrease respiratory drive as well as significant comorbidities. - VAS [place] LOWER EXTREMITY VENOUS (DVT); STAT -If positive- will need  to discuss with hem/onc on best course > she says adherant to eliquis, was on xarelto for PAF prior to coumadin (questionable compliance with INR not in therapeutic range)  Acute pain of left shoulder Differential includes possible frozen shoulder after new immobilization versus  possible dislocation given history that daughters gave about bathing patient in hospital.  Will also place sports medicine referral for potential ultrasound/injection - DG Shoulder Left; Future - gabapentin (NEURONTIN) 100 MG capsule; Take 1 capsule (100 mg total) by mouth at bedtime.  Dispense: 30 capsule; Refill: 0 -Sports medicine referral   Cerebrovascular accident (CVA) due to embolism of right middle cerebral artery Likely in the setting of known LV thrombus.  Was switched from warfarin to Eliquis twice daily.  Denies any bleeding or dark stools. - Basic Metabolic Panel - Continue Eliquis twice daily - Ambulatory referral to Home Health   Levin Erp, MD [facility]

## 2022-11-11 NOTE — Patient Instructions (Signed)
It was great to see you! Thank you for allowing me to participate in your care!   Our plans for today:  -I am sending in gabapentin to take nightly to help with pain -I am going to get a shoulder x-ray to make sure that your shoulder still in place and doing okay-I had also like you to follow-up with sports medicine about this left shoulder pain as they could consider injection -I am ordering you DVT ultrasound to make sure you do not have a clot in your leg -I am also going to get some labs on you  Take care and seek immediate care sooner if you develop any concerns.  Levin Erp, MD

## 2022-11-12 LAB — BASIC METABOLIC PANEL
BUN/Creatinine Ratio: 16 (ref 9–23)
BUN: 21 mg/dL (ref 6–24)
CO2: 24 mmol/L (ref 20–29)
Calcium: 10 mg/dL (ref 8.7–10.2)
Chloride: 90 mmol/L — ABNORMAL LOW (ref 96–106)
Creatinine, Ser: 1.31 mg/dL — ABNORMAL HIGH (ref 0.57–1.00)
Glucose: 311 mg/dL — ABNORMAL HIGH (ref 70–99)
Potassium: 3.5 mmol/L (ref 3.5–5.2)
Sodium: 135 mmol/L (ref 134–144)
eGFR: 52 mL/min/{1.73_m2} — ABNORMAL LOW (ref >59)

## 2022-11-13 ENCOUNTER — Other Ambulatory Visit: Payer: Self-pay

## 2022-11-13 ENCOUNTER — Emergency Department (HOSPITAL_COMMUNITY): Payer: Medicare Other

## 2022-11-13 ENCOUNTER — Encounter (HOSPITAL_COMMUNITY): Payer: Self-pay

## 2022-11-13 ENCOUNTER — Emergency Department (HOSPITAL_COMMUNITY)
Admission: EM | Admit: 2022-11-13 | Discharge: 2022-11-13 | Disposition: A | Discharge status: Home / Self Care | Payer: Medicare Other | Attending: Emergency Medicine | Admitting: Emergency Medicine

## 2022-11-13 DIAGNOSIS — I110 Hypertensive heart disease with heart failure: Secondary | ICD-10-CM | POA: Diagnosis not present

## 2022-11-13 DIAGNOSIS — E1165 Type 2 diabetes mellitus with hyperglycemia: Secondary | ICD-10-CM | POA: Insufficient documentation

## 2022-11-13 DIAGNOSIS — K8020 Calculus of gallbladder without cholecystitis without obstruction: Secondary | ICD-10-CM | POA: Diagnosis not present

## 2022-11-13 DIAGNOSIS — J45909 Unspecified asthma, uncomplicated: Secondary | ICD-10-CM | POA: Diagnosis not present

## 2022-11-13 DIAGNOSIS — Z7984 Long term (current) use of oral hypoglycemic drugs: Secondary | ICD-10-CM | POA: Diagnosis not present

## 2022-11-13 DIAGNOSIS — E871 Hypo-osmolality and hyponatremia: Secondary | ICD-10-CM | POA: Diagnosis not present

## 2022-11-13 DIAGNOSIS — E878 Other disorders of electrolyte and fluid balance, not elsewhere classified: Secondary | ICD-10-CM | POA: Insufficient documentation

## 2022-11-13 DIAGNOSIS — E876 Hypokalemia: Secondary | ICD-10-CM | POA: Insufficient documentation

## 2022-11-13 DIAGNOSIS — G8929 Other chronic pain: Secondary | ICD-10-CM | POA: Insufficient documentation

## 2022-11-13 DIAGNOSIS — Z7901 Long term (current) use of anticoagulants: Secondary | ICD-10-CM | POA: Insufficient documentation

## 2022-11-13 DIAGNOSIS — R0789 Other chest pain: Secondary | ICD-10-CM

## 2022-11-13 DIAGNOSIS — Z79899 Other long term (current) drug therapy: Secondary | ICD-10-CM | POA: Insufficient documentation

## 2022-11-13 DIAGNOSIS — M546 Pain in thoracic spine: Secondary | ICD-10-CM | POA: Diagnosis not present

## 2022-11-13 DIAGNOSIS — I5043 Acute on chronic combined systolic (congestive) and diastolic (congestive) heart failure: Secondary | ICD-10-CM | POA: Insufficient documentation

## 2022-11-13 LAB — CBC WITH DIFFERENTIAL/PLATELET
Abs Immature Granulocytes: 0 10*3/uL (ref 0.00–0.07)
Basophils Absolute: 0 10*3/uL (ref 0.0–0.1)
Basophils Relative: 0 %
Eosinophils Absolute: 0 10*3/uL (ref 0.0–0.5)
Eosinophils Relative: 0 %
HCT: 47.4 % — ABNORMAL HIGH (ref 36.0–46.0)
Hemoglobin: 15.1 g/dL — ABNORMAL HIGH (ref 12.0–15.0)
Lymphocytes Relative: 25 %
Lymphs Abs: 2.4 10*3/uL (ref 0.7–4.0)
MCH: 22.3 pg — ABNORMAL LOW (ref 26.0–34.0)
MCHC: 31.9 g/dL (ref 30.0–36.0)
MCV: 70 fL — ABNORMAL LOW (ref 80.0–100.0)
Monocytes Absolute: 0.7 10*3/uL (ref 0.1–1.0)
Monocytes Relative: 7 %
Neutro Abs: 6.5 10*3/uL (ref 1.7–7.7)
Neutrophils Relative %: 68 %
Platelets: 285 10*3/uL (ref 150–400)
RBC: 6.77 MIL/uL — ABNORMAL HIGH (ref 3.87–5.11)
RDW: 21.9 % — ABNORMAL HIGH (ref 11.5–15.5)
WBC: 9.5 10*3/uL (ref 4.0–10.5)
nRBC: 0 % (ref 0.0–0.2)
nRBC: 0 /100 WBC

## 2022-11-13 LAB — URINALYSIS, W/ REFLEX TO CULTURE (INFECTION SUSPECTED)
Bilirubin Urine: NEGATIVE
Glucose, UA: >=500 mg/dL — AB
Hgb urine dipstick: NEGATIVE
Ketones, ur: NEGATIVE mg/dL
Nitrite: NEGATIVE
Protein, ur: NEGATIVE mg/dL
Specific Gravity, Urine: 1.024 (ref 1.005–1.030)
pH: 5 (ref 5.0–8.0)

## 2022-11-13 LAB — COMPREHENSIVE METABOLIC PANEL
ALT: 35 U/L (ref 0–44)
AST: 47 U/L — ABNORMAL HIGH (ref 15–41)
Albumin: 3.3 g/dL — ABNORMAL LOW (ref 3.5–5.0)
Alkaline Phosphatase: 228 U/L — ABNORMAL HIGH (ref 38–126)
Anion gap: 15 (ref 5–15)
BUN: 15 mg/dL (ref 6–20)
CO2: 26 mmol/L (ref 22–32)
Calcium: 10 mg/dL (ref 8.9–10.3)
Chloride: 92 mmol/L — ABNORMAL LOW (ref 98–111)
Creatinine, Ser: 1.16 mg/dL — ABNORMAL HIGH (ref 0.44–1.00)
GFR, Estimated: 60 mL/min — ABNORMAL LOW (ref >60)
Glucose, Bld: 171 mg/dL — ABNORMAL HIGH (ref 70–99)
Potassium: 3.1 mmol/L — ABNORMAL LOW (ref 3.5–5.1)
Sodium: 133 mmol/L — ABNORMAL LOW (ref 135–145)
Total Bilirubin: 2.6 mg/dL — ABNORMAL HIGH (ref 0.3–1.2)
Total Protein: 8.8 g/dL — ABNORMAL HIGH (ref 6.5–8.1)

## 2022-11-13 LAB — BRAIN NATRIURETIC PEPTIDE: B Natriuretic Peptide: 169.8 pg/mL — ABNORMAL HIGH (ref 0.0–100.0)

## 2022-11-13 LAB — LIPASE, BLOOD: Lipase: 41 U/L (ref 11–51)

## 2022-11-13 LAB — TROPONIN I (HIGH SENSITIVITY)
Troponin I (High Sensitivity): 59 ng/L — ABNORMAL HIGH (ref <18)
Troponin I (High Sensitivity): 54 ng/L — ABNORMAL HIGH (ref <18)

## 2022-11-13 MED ORDER — FENTANYL CITRATE PF 50 MCG/ML IJ SOSY
50.0000 ug | PREFILLED_SYRINGE | Freq: Once | INTRAMUSCULAR | Status: AC
  Administered 2022-11-13: 50 ug via INTRAVENOUS
  Filled 2022-11-13: qty 1

## 2022-11-13 MED ORDER — ONDANSETRON 4 MG PO TBDP
4.0000 mg | ORAL_TABLET | Freq: Once | ORAL | Status: AC
  Administered 2022-11-13: 4 mg via ORAL
  Filled 2022-11-13: qty 1

## 2022-11-13 MED ORDER — HYDROCODONE-ACETAMINOPHEN 5-325 MG PO TABS
1.0000 | ORAL_TABLET | Freq: Once | ORAL | Status: AC
  Administered 2022-11-13: 1 via ORAL
  Filled 2022-11-13: qty 1

## 2022-11-13 NOTE — ED Notes (Addendum)
Pt calling out into hallway, "I want to go home!" This tech gently explained why pt was still in hospital and asked if pt and nursing staff could come to a conclusion so the pt could get home safely. Pt asked for sandwich, which was provided. Pt in no distress at the time.

## 2022-11-13 NOTE — ED Provider Notes (Addendum)
[place] EMERGENCY DEPARTMENT AT [facility] Provider Note  CSN: [id number] Arrival date & time: 11/13/22 1429  Chief Complaint(s) Chest Pain (Chest Wall Pain)  HPI [name] is a 47 y.o. female here for chest pain    Chest Pain  Anterior chest pain, aching. 2 days. Constant. Worse with deep breathing, movement (shifting in bed and being moved), and palpation. No SOB, coughing, fever. Reports N/V for 2 days. Intermittent. Unable to keep much done, including some of her medications. States that she has been home for 1 week since being discharged from the rehab facility after her stroke. Has left sided deficits.  Reports back pain since her stroke due being bedridden. Back pain worse with movement and palpation as well.   Past Medical History Past Medical History:  Diagnosis Date   Abnormal Pap smear    Acute combined systolic and diastolic heart failure 02/19/2019   Wt Readings from Last 3 Encounters:  09/18/21  84.7 kg  09/02/21  84.4 kg  08/31/21  84.2 kg         Acute heart failure 01/29/2022   Acute on chronic combined systolic and diastolic CHF (congestive heart failure) 02/19/2019   Acute on chronic congestive heart failure    Acute on chronic congestive heart failure    Acute on chronic diastolic CHF (congestive heart failure) 10/19/2021   Acute on chronic systolic heart failure 02/22/2022   Anemia    Asthma    Atrial fibrillation    history of paroxysmal atrial fibrillation   CHF (congestive heart failure) 11/02/2021   CHF exacerbation 10/17/2021   Diabetes mellitus    DKA, type 2 02/06/2019   Elevated troponin 09/30/2019   Heart failure with reduced ejection fraction    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  Consider chronic etiology given continued fertility issues   Hypertension    Hypertensive emergency 09/30/2019   Hypertensive urgency 06/02/2022   Major depressive  disorder 02/08/2012   Reports that 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     Migraines    Morbid obesity 02/06/2019   Nausea & vomiting 06/26/2022   Nonischemic cardiomyopathy 09/22/2021   NSTEMI (non-ST elevated myocardial infarction) 02/23/2022   Obstructive sleep apnea 06/30/2015   Polycystic ovarian disease    Patient Active Problem List   Diagnosis Date Noted   Gaze preference w/left inattention 10/04/2022   Left hemiplegia 10/04/2022   Mild episode of recurrent major depressive disorder 10/04/2022   Insomnia 10/02/2022   Acute ischemic right MCA stroke 10/01/2022   Acute respiratory failure with hypoxia 09/27/2022   Cerebrovascular accident (CVA) due to embolism of right middle cerebral artery 09/27/2022   Middle cerebral artery embolism, right 09/24/2022   Acute on chronic combined systolic (congestive) and diastolic (congestive) heart failure 09/23/2022   Anasarca 09/23/2022   CHF exacerbation 08/05/2022   Nausea and vomiting 06/25/2022   Mild protein malnutrition 06/25/2022   Class 2 obesity 06/25/2022   Hyperbilirubinemia 06/25/2022   Chronic pain 05/09/2022   Dizziness 11/02/2021   Depression, recurrent    Nonischemic cardiomyopathy 09/22/2021   Nonobstructive atherosclerosis of coronary artery 09/22/2021   LV (left ventricular) mural thrombus 09/22/2021   Hypokalemia 10/13/2019   Elevated troponin 09/30/2019   Chronic chest wall pain 03/27/2019   Hyperglycemia    Biventricular failure 02/06/2019   Chronic combined systolic and  diastolic heart failure 12/31/2018   Paroxysmal atrial fibrillation 12/31/2018   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 Authorizing Provider  Accu-Chek Softclix Lancets lancets Use up 4 (four) times daily as directed 06/06/22   [place], Alvira Philips, DO  acetaminophen (TYLENOL) 500 MG tablet Take 500-1,000 mg by mouth every 6 (six) hours as needed for mild pain or headache.    [provider]  amantadine (SYMMETREL) 100 MG capsule Take 2 capsules (200 mg total) by mouth daily. 11/01/22   Love, Evlyn Kanner, PA-C  apixaban (ELIQUIS) 5 MG TABS tablet Take 1 tablet (5 mg total) by mouth 2 (two) times daily. 10/31/22   Love, Evlyn Kanner, PA-C  atorvastatin (LIPITOR) 80 MG tablet Take 1 tablet (80 mg total) by mouth daily after supper. 10/31/22   Love, Evlyn Kanner, PA-C  blood glucose meter kit and supplies Use up to 4 (four) times daily as directed 06/06/22   [place], Alvira Philips, DO  Blood Glucose Monitoring Suppl DEVI 1 each by Does not apply route in the morning, at noon, and at bedtime. May substitute to any manufacturer covered by patient's insurance. 11/01/22   Love, Evlyn Kanner, PA-C  calcium carbonate (TUMS - DOSED IN MG ELEMENTAL CALCIUM) 500 MG chewable tablet Chew 1 tablet (200 mg of elemental calcium total) by mouth daily after supper. 10/31/22   Love, Evlyn Kanner, PA-C  docusate sodium (COLACE) 100 MG capsule Take 1 capsule (100 mg total) by mouth daily. 11/01/22   Love, Evlyn Kanner, PA-C  empagliflozin (JARDIANCE) 25 MG TABS tablet Take 1 tablet (25 mg total) by mouth daily. 11/02/22   Love, Evlyn Kanner, PA-C  escitalopram (LEXAPRO) 10 MG tablet Take 1 tablet (10 mg total) by mouth at bedtime. 10/31/22   Love, Evlyn Kanner, PA-C  gabapentin (NEURONTIN) 100 MG capsule Take 1 capsule (100 mg total) by mouth at bedtime. 11/11/22   Levin Erp, MD  glipiZIDE (GLUCOTROL) 5 MG tablet Take 1 tablet (5 mg  total) by mouth daily before breakfast. 11/01/22   Love, Evlyn Kanner, PA-C  Glucose Blood (BLOOD GLUCOSE TEST STRIPS) STRP Inject 1 each into the skin 4 (four) times daily -  before meals and at bedtime. May substitute to any manufacturer covered by patient's insurance. 11/01/22 12/01/22  Jacquelynn Cree, PA-C  glucose blood (ONETOUCH VERIO) test strip Use up to 4 (four) times daily as directed 06/06/22   [place], Alvira Philips, DO  hydrOXYzine (ATARAX) 10 MG tablet Take 1 tablet (10 mg total) by mouth 3 (three) times daily as needed. 10/31/22   Jacquelynn Cree, PA-C  Lancet Device MISC 1 each by Does not apply route in the morning, at noon, and at bedtime. May substitute to any manufacturer covered by patient's insurance. 11/01/22 12/01/22  Jacquelynn Cree, PA-C  Lancets Misc. MISC 1 each by Does not apply route in the morning, at noon, and at bedtime. May substitute to any manufacturer covered by patient's insurance. 11/01/22 12/01/22  Love, Evlyn Kanner, PA-C  losartan (COZAAR) 50 MG  tablet Take 1 tablet (50 mg total) by mouth daily. 10/31/22   Love, Evlyn Kanner, PA-C  megestrol (MEGACE) 400 MG/10ML suspension Take 10 mLs (400 mg total) by mouth 2 (two) times daily. 10/31/22   Love, Evlyn Kanner, PA-C  melatonin 5 MG TABS Take 1 tablet (5 mg total) by mouth at bedtime. 10/31/22   Love, Evlyn Kanner, PA-C  Menthol-Methyl Salicylate (MUSCLE RUB) 10-15 % CREA Apply 1 Application topically 3 (three) times daily. 10/31/22   Love, Evlyn Kanner, PA-C  methocarbamol (ROBAXIN) 500 MG tablet Take 1 tablet (500 mg total) by mouth every 6 (six) hours as needed for muscle spasms. 10/31/22   Love, Evlyn Kanner, PA-C  metoprolol tartrate (LOPRESSOR) 25 MG tablet Take 1 tablet (25 mg total) by mouth 2 (two) times daily. 10/31/22   Love, Evlyn Kanner, PA-C  morphine (MSIR) 15 MG tablet Take 1 tablet (15 mg total) by mouth every 8 (eight) hours as needed for severe pain. 10/31/22   Love, Evlyn Kanner, PA-C  Multiple Vitamin (MULTIVITAMIN WITH MINERALS) TABS tablet Take 1 tablet by mouth  daily. 10/02/22   de [place]clair Halsted, Cortney E, NP  pantoprazole (PROTONIX) 40 MG tablet Take 1 tablet (40 mg total) by mouth at bedtime. 10/31/22   Love, Evlyn Kanner, PA-C  polyethylene glycol powder (GLYCOLAX/MIRALAX) 17 GM/SCOOP powder Take 1 capful (17 g) by mouth 2 (two) times daily. 10/31/22   Love, Evlyn Kanner, PA-C  senna-docusate (SENOKOT-S) 8.6-50 MG tablet Take 3 tablets by mouth daily with lunch. 10/31/22   Love, Evlyn Kanner, PA-C  spironolactone (ALDACTONE) 25 MG tablet Take 1 tablet (25 mg total) by mouth daily. 11/01/22   Love, Evlyn Kanner, PA-C  torsemide (DEMADEX) 20 MG tablet Take 1 tablet (20 mg total) by mouth daily. 11/01/22   Love, Evlyn Kanner, PA-C  traZODone (DESYREL) 50 MG tablet Take 0.5 tablets (25 mg total) by mouth at bedtime. 10/31/22   Love, Evlyn Kanner, PA-C  Vitamin D, Ergocalciferol, (DRISDOL) 1.25 MG (50000 UNIT) CAPS capsule Take 1 capsule (50,000 Units total) by mouth every 7 (seven) days. 10/31/22   Love, Evlyn Kanner, PA-C  carvedilol (COREG) 3.125 MG tablet Take 1 tablet (3.125 mg total) by mouth 2 (two) times daily with a meal. 02/27/22 07/01/22  Meredeth Ide, MD                                                                                                                                    Allergies Fish allergy, Shellfish allergy, Metformin and related, Trulicity [dulaglutide], Nsaids, and Advil [ibuprofen]  Review of Systems Review of Systems  Cardiovascular:  Positive for chest pain.   As noted in HPI  Physical Exam Vital Signs  I have reviewed the triage vital signs BP (!) 147/119   Pulse 81   Temp 98.6 F (37 C) (Oral)   Resp 11   Ht  (1.499 m)   Wt 60.8  kg   SpO2 100%   BMI 27.07 kg/m   Physical Exam Vitals reviewed.  Constitutional:      General: She is not in acute distress.    Appearance: She is well-developed. She is not diaphoretic.  HENT:     Head: Normocephalic and atraumatic.     Nose: Nose normal.  Eyes:     General: No scleral icterus.       Right eye:  No discharge.        Left eye: No discharge.     Conjunctiva/sclera: Conjunctivae normal.     Pupils: Pupils are equal, round, and reactive to light.  Cardiovascular:     Rate and Rhythm: Normal rate and regular rhythm.     Heart sounds: No murmur heard.    No friction rub. No gallop.  Pulmonary:     Effort: Pulmonary effort is normal. No respiratory distress.     Breath sounds: Normal breath sounds. No stridor. No rales.  Chest:     Chest wall: Tenderness present.    Abdominal:     General: There is no distension.     Palpations: Abdomen is soft.     Tenderness: There is no abdominal tenderness. There is no guarding or rebound.  Musculoskeletal:        General: No tenderness.     Cervical back: Normal range of motion and neck supple.  Skin:    General: Skin is warm and dry.     Findings: No erythema or rash.  Neurological:     Mental Status: She is alert and oriented to person, place, and time.     Comments: Left sided deficits. Does move LUE at shoulder, but not voluntarily.  Sensation intact.      ED Results and Treatments Labs (all labs ordered are listed, but only abnormal results are displayed) Labs Reviewed  COMPREHENSIVE METABOLIC PANEL - Abnormal; Notable for the following components:      Result Value   Sodium 133 (*)    Potassium 3.1 (*)    Chloride 92 (*)    Glucose, Bld 171 (*)    Creatinine, Ser 1.16 (*)    Total Protein 8.8 (*)    Albumin 3.3 (*)    AST 47 (*)    Alkaline Phosphatase 228 (*)    Total Bilirubin 2.6 (*)    GFR, Estimated 60 (*)    All other components within normal limits  CBC WITH DIFFERENTIAL/PLATELET - Abnormal; Notable for the following components:   RBC 6.77 (*)    Hemoglobin 15.1 (*)    HCT 47.4 (*)    MCV 70.0 (*)    MCH 22.3 (*)    RDW 21.9 (*)    All other components within normal limits  BRAIN NATRIURETIC PEPTIDE - Abnormal; Notable for the following components:   B Natriuretic Peptide 169.8 (*)    All other  components within normal limits  URINALYSIS, W/ REFLEX TO CULTURE (INFECTION SUSPECTED) - Abnormal; Notable for the following components:   APPearance HAZY (*)    Glucose, UA >=500 (*)    Leukocytes,Ua TRACE (*)    Bacteria, UA RARE (*)    All other components within normal limits  TROPONIN I (HIGH SENSITIVITY) - Abnormal; Notable for the following components:   Troponin I (High Sensitivity) 59 (*)    All other components within normal limits  TROPONIN I (HIGH SENSITIVITY) - Abnormal; Notable for the following components:   Troponin I (High Sensitivity) 54 (*)  All other components within normal limits  LIPASE, BLOOD                                                                                                                         EKG  EKG Interpretation  Date/Time:  Sunday November 13 2022 15:00:39 EDT Ventricular Rate:  69 PR Interval:  140 QRS Duration: 92 QT Interval:  461 QTC Calculation: 494 R Axis:   62 Text Interpretation: Sinus rhythm LVH with secondary repolarization abnormality Borderline prolonged QT interval similar to previous tracings Confirmed by Drema Pry [phone]) on 11/13/2022 5:22:11 PM       Radiology US Abdomen Limited RUQ (LIVER/GB)  Result Date: 11/13/2022 CLINICAL DATA:  Right upper quadrant pain. EXAM: ULTRASOUND ABDOMEN LIMITED RIGHT UPPER QUADRANT COMPARISON:  June 24, 2022 FINDINGS: Gallbladder: A mild amount of shadowing echogenic sludge is seen within the dependent portion of the gallbladder lumen. No gallstones are identified. The gallbladder wall measures 4.1 mm in thickness. No sonographic Murphy sign noted by sonographer. Common bile duct: Diameter: 3.8 mm Liver: No focal lesion identified. Within normal limits in parenchymal echogenicity. Portal vein is patent on color Doppler imaging with normal direction of blood flow towards the liver. Other: None. IMPRESSION: Gallbladder sludge without evidence of cholelithiasis or acute cholecystitis.  Electronically Signed   By: Aram Candela M.D.   On: 11/13/2022 20:05   DG Chest Port 1 View  Result Date: 11/13/2022 CLINICAL DATA:  Chest pain for about 2 days. EXAM: PORTABLE CHEST 1 VIEW COMPARISON:  Chest radiograph dated 09/28/2022. FINDINGS: The heart is enlarged. The lungs are clear. The osseous structures appear intact. IMPRESSION: 1. Cardiomegaly. 2. No acute pulmonary process. Electronically Signed   By: Romona Curls M.D.   On: 11/13/2022 15:22    Medications Ordered in ED Medications  ondansetron (ZOFRAN-ODT) disintegrating tablet 4 mg (4 mg Oral Given 11/13/22 1721)  HYDROcodone-acetaminophen (NORCO/VICODIN) 5-325 MG per tablet 1 tablet (1 tablet Oral Given 11/13/22 1719)  fentaNYL (SUBLIMAZE) injection 50 mcg (50 mcg Intravenous Given 11/13/22 1910)                                                                                                                                     Procedures Ultrasound ED Peripheral IV (Provider)  Date/Time: 11/13/2022 11:12 PM  Performed by: Nira Conn, MD Authorized by: Nira Conn, MD   Procedure details:  Indications: multiple failed IV attempts     Skin Prep: chlorhexidine gluconate     Location:  Right AC   Angiocath:  20 G   Bedside Ultrasound Guided: Yes     Images: archived     Patient tolerated procedure without complications: Yes     Dressing applied: Yes     (including critical care time)  Medical Decision Making / ED Course  Click here for ABCD2, HEART and other calculators  Medical Decision Making Amount and/or Complexity of Data Reviewed Labs: ordered. Decision-making details documented in ED Course. Radiology: ordered and independent interpretation performed. Decision-making details documented in ED Course. ECG/medicine tests: ordered and independent interpretation performed. Decision-making details documented in ED Course.  Risk Prescription drug management. Decision regarding  hospitalization.    This patient presents to the ED for concern of chest pain, this involves an extensive number of treatment options, and is a complaint that carries with it a high risk of complications and morbidity. The presesation is most concerning for chest wall pain, but additional differential diagnosis includes but not limited to ACS, CHF excacerbation, PE, PNA, PTx, GERD.  Initial intervention:  ODT zofran Norco  Work up: [employer] and/or imaging independently interpreted by me) EKG w/o acute ischemic changes. TW changes seen previously in Oct 2023 when she had negative ACS work up. On my read of the chest x-ray, there was no evidence suggestive of pneumonia, pneumothorax, pneumomediastinum, pulmonary edema concerning for new or exacerbation of heart failure, abnormal contour of the mediastinum to suggest dissection, and no evidence of acute injuries. Initial troponin close to her baseline.   Given the constant pain, unlikely ACS but will repeat given her history. Second trop stable BNP CBC without leukocytosis or anemia Metabolic panel with mild hyponatremia, hypokalemia, hypochloremia.  Hyperglycemia without evidence of DKA.  Mild renal sufficiency without evidence of AKI.  Patient has mildly elevated LFTs. Right upper quadrant ultrasound negative for acute cholecystitis or acute choledocholithiasis.  Patient does have cholelithiasis. UA without evidence of infection  Reassessment: Pain improved Eating and tolerating PO        Final Clinical Impression(s) / ED Diagnoses Final diagnoses:  Chest wall pain  Chronic bilateral thoracic back pain  Calculus of gallbladder without cholecystitis without obstruction   The patient appears reasonably screened and/or stabilized for discharge and I doubt any other medical condition or other EMC requiring further screening, evaluation, or treatment in the ED at this time. I have discussed the findings, Dx and Tx plan with the  patient/family who expressed understanding and agree(s) with the plan. Discharge instructions discussed at length. The patient/family was given strict return precautions who verbalized understanding of the instructions. No further questions at time of discharge.  Disposition: Discharge  Condition: Good  ED Discharge Orders     None       Follow Up: Primary care provider  Call  to schedule an appointment for close follow up  [facility] [address] [place] [address] Call  as needed for evaluation of gallstones           This chart was dictated using voice recognition software.  Despite best efforts to proofread,  errors can occur which can change the documentation meaning.      Nira Conn, MD 11/13/22 2312

## 2022-11-13 NOTE — ED Triage Notes (Signed)
Patient BIB [facility] from home for chest wall pain for about 2 days accompanied by nausea and vomiting. Patient has hx of stroke and left sided paralysis. EMS reports pain was someone relieved by repositioning and is 9/10. VSS, A&Ox4, non ambulatory at baseline

## 2022-11-14 ENCOUNTER — Telehealth: Payer: Self-pay

## 2022-11-14 ENCOUNTER — Telehealth: Payer: Self-pay | Admitting: Student

## 2022-11-14 ENCOUNTER — Encounter
Payer: Medicare Other | Attending: Physical Medicine and Rehabilitation | Admitting: Physical Medicine and Rehabilitation

## 2022-11-14 DIAGNOSIS — S43002A Unspecified subluxation of left shoulder joint, initial encounter: Secondary | ICD-10-CM

## 2022-11-14 NOTE — Telephone Encounter (Signed)
Called patient and confirmed date of birth.  I discussed this imaging result with attending Drs. Neal and McDiarmid of humeral head subluxation.  Best course of action would be to send patient to a shoulder orthopedic doctor to take a closer look.  Patient is amenable to this referral.  I will ask our referral coordinator to cancel the sports medicine referral at this time.  Patient is aware.

## 2022-11-14 NOTE — Telephone Encounter (Signed)
Pt was scheduled for 11/28/2022  with Dr.Ganta.

## 2022-11-14 NOTE — Telephone Encounter (Signed)
-----   Message from Levin Erp, MD sent at 11/12/2022  9:43 PM EDT ----- Could you set up appointment with PCP Ganta to discuss diabetes management further?

## 2022-11-17 ENCOUNTER — Telehealth: Payer: Self-pay

## 2022-11-17 NOTE — Telephone Encounter (Signed)
This SW covering for primary SW, Becky Dupree.   SW received phone call from pt father Bernette Redbird reporting that he was inquiring when [facility] therapies would begin as they have never begun and was informed there was no order. SW shared will reach out to liaison to discuss further.   SW emailed [facility] to discuss issue about HHPT/OT/SLP/SN, and waiting on follow-up.

## 2022-11-17 NOTE — Telephone Encounter (Signed)
This SW covering for primary SW, Becky Dupree.   SW spoke with [facility] who reported the branch has called several different numbers and spoke with primary SW for another contact number #[phone] and no response. Reports willing to accept referral and able to see on Saturday if able to coordinate care. Reports family needs to call branch and speak with scheduling #[phone] and ask for Dayneicha to schedule.   SW called pt father Bernette Redbird to inform on above, and reports he is in [facility] line and unable to write down. Will follow-up with this SW.    Cecile Sheerer, MSW, LCSWA Office: [phone] Cell: [phone] Fax: [phone]

## 2022-11-18 ENCOUNTER — Inpatient Hospital Stay (HOSPITAL_COMMUNITY)
Admission: EM | Admit: 2022-11-18 | Discharge: 2022-11-22 | DRG: 378 | Disposition: A | Discharge status: Home / Self Care | Payer: Medicare Other | Attending: Family Medicine | Admitting: Family Medicine

## 2022-11-18 ENCOUNTER — Emergency Department (HOSPITAL_COMMUNITY): Payer: Medicare Other

## 2022-11-18 DIAGNOSIS — K920 Hematemesis: Secondary | ICD-10-CM | POA: Diagnosis present

## 2022-11-18 DIAGNOSIS — G894 Chronic pain syndrome: Secondary | ICD-10-CM | POA: Diagnosis present

## 2022-11-18 DIAGNOSIS — D6832 Hemorrhagic disorder due to extrinsic circulating anticoagulants: Secondary | ICD-10-CM | POA: Diagnosis present

## 2022-11-18 DIAGNOSIS — R17 Unspecified jaundice: Secondary | ICD-10-CM | POA: Diagnosis present

## 2022-11-18 DIAGNOSIS — Z7984 Long term (current) use of oral hypoglycemic drugs: Secondary | ICD-10-CM | POA: Diagnosis not present

## 2022-11-18 DIAGNOSIS — I513 Intracardiac thrombosis, not elsewhere classified: Secondary | ICD-10-CM | POA: Diagnosis present

## 2022-11-18 DIAGNOSIS — Z86718 Personal history of other venous thrombosis and embolism: Secondary | ICD-10-CM

## 2022-11-18 DIAGNOSIS — E1165 Type 2 diabetes mellitus with hyperglycemia: Secondary | ICD-10-CM | POA: Diagnosis present

## 2022-11-18 DIAGNOSIS — R079 Chest pain, unspecified: Secondary | ICD-10-CM | POA: Diagnosis not present

## 2022-11-18 DIAGNOSIS — K921 Melena: Secondary | ICD-10-CM | POA: Diagnosis not present

## 2022-11-18 DIAGNOSIS — I428 Other cardiomyopathies: Secondary | ICD-10-CM | POA: Diagnosis present

## 2022-11-18 DIAGNOSIS — Z79899 Other long term (current) drug therapy: Secondary | ICD-10-CM

## 2022-11-18 DIAGNOSIS — E876 Hypokalemia: Secondary | ICD-10-CM | POA: Diagnosis not present

## 2022-11-18 DIAGNOSIS — I5042 Chronic combined systolic (congestive) and diastolic (congestive) heart failure: Secondary | ICD-10-CM | POA: Diagnosis present

## 2022-11-18 DIAGNOSIS — Z5986 Financial insecurity: Secondary | ICD-10-CM

## 2022-11-18 DIAGNOSIS — I4891 Unspecified atrial fibrillation: Principal | ICD-10-CM

## 2022-11-18 DIAGNOSIS — I252 Old myocardial infarction: Secondary | ICD-10-CM

## 2022-11-18 DIAGNOSIS — K922 Gastrointestinal hemorrhage, unspecified: Secondary | ICD-10-CM

## 2022-11-18 DIAGNOSIS — I152 Hypertension secondary to endocrine disorders: Secondary | ICD-10-CM | POA: Diagnosis present

## 2022-11-18 DIAGNOSIS — K828 Other specified diseases of gallbladder: Secondary | ICD-10-CM | POA: Diagnosis present

## 2022-11-18 DIAGNOSIS — I480 Paroxysmal atrial fibrillation: Secondary | ICD-10-CM | POA: Diagnosis present

## 2022-11-18 DIAGNOSIS — E1169 Type 2 diabetes mellitus with other specified complication: Secondary | ICD-10-CM | POA: Diagnosis present

## 2022-11-18 DIAGNOSIS — D72829 Elevated white blood cell count, unspecified: Secondary | ICD-10-CM | POA: Diagnosis present

## 2022-11-18 DIAGNOSIS — E1159 Type 2 diabetes mellitus with other circulatory complications: Secondary | ICD-10-CM | POA: Diagnosis present

## 2022-11-18 DIAGNOSIS — E785 Hyperlipidemia, unspecified: Secondary | ICD-10-CM | POA: Diagnosis present

## 2022-11-18 DIAGNOSIS — Z886 Allergy status to analgesic agent status: Secondary | ICD-10-CM | POA: Diagnosis not present

## 2022-11-18 DIAGNOSIS — Z8249 Family history of ischemic heart disease and other diseases of the circulatory system: Secondary | ICD-10-CM

## 2022-11-18 DIAGNOSIS — I2510 Atherosclerotic heart disease of native coronary artery without angina pectoris: Secondary | ICD-10-CM | POA: Diagnosis present

## 2022-11-18 DIAGNOSIS — Z8674 Personal history of sudden cardiac arrest: Secondary | ICD-10-CM

## 2022-11-18 DIAGNOSIS — Z8673 Personal history of transient ischemic attack (TIA), and cerebral infarction without residual deficits: Secondary | ICD-10-CM

## 2022-11-18 DIAGNOSIS — J45909 Unspecified asthma, uncomplicated: Secondary | ICD-10-CM | POA: Diagnosis present

## 2022-11-18 DIAGNOSIS — R1013 Epigastric pain: Secondary | ICD-10-CM | POA: Diagnosis present

## 2022-11-18 DIAGNOSIS — Z7901 Long term (current) use of anticoagulants: Secondary | ICD-10-CM

## 2022-11-18 DIAGNOSIS — Z833 Family history of diabetes mellitus: Secondary | ICD-10-CM

## 2022-11-18 DIAGNOSIS — Z888 Allergy status to other drugs, medicaments and biological substances status: Secondary | ICD-10-CM

## 2022-11-18 LAB — CBC WITH DIFFERENTIAL/PLATELET
Abs Immature Granulocytes: 0.06 K/uL (ref 0.00–0.07)
Basophils Absolute: 0.1 K/uL (ref 0.0–0.1)
Basophils Relative: 0 %
Eosinophils Absolute: 0 K/uL (ref 0.0–0.5)
Eosinophils Relative: 0 %
HCT: 46 % (ref 36.0–46.0)
Hemoglobin: 14.4 g/dL (ref 12.0–15.0)
Immature Granulocytes: 0 %
Lymphocytes Relative: 13 %
Lymphs Abs: 1.8 K/uL (ref 0.7–4.0)
MCH: 22.3 pg — ABNORMAL LOW (ref 26.0–34.0)
MCHC: 31.3 g/dL (ref 30.0–36.0)
MCV: 71.1 fL — ABNORMAL LOW (ref 80.0–100.0)
Monocytes Absolute: 0.7 K/uL (ref 0.1–1.0)
Monocytes Relative: 5 %
Neutro Abs: 11.4 K/uL — ABNORMAL HIGH (ref 1.7–7.7)
Neutrophils Relative %: 82 %
Platelets: 303 K/uL (ref 150–400)
RBC: 6.47 MIL/uL — ABNORMAL HIGH (ref 3.87–5.11)
RDW: 22.3 % — ABNORMAL HIGH (ref 11.5–15.5)
WBC: 14 K/uL — ABNORMAL HIGH (ref 4.0–10.5)
nRBC: 0 % (ref 0.0–0.2)

## 2022-11-18 LAB — URINALYSIS, ROUTINE W REFLEX MICROSCOPIC
Bacteria, UA: NONE SEEN
Glucose, UA: >=500 mg/dL — AB
Hgb urine dipstick: NEGATIVE
Ketones, ur: NEGATIVE mg/dL
Leukocytes,Ua: NEGATIVE
Nitrite: NEGATIVE
Protein, ur: >=300 mg/dL — AB
Specific Gravity, Urine: 1.035 — ABNORMAL HIGH (ref 1.005–1.030)
pH: 5 (ref 5.0–8.0)

## 2022-11-18 LAB — COMPREHENSIVE METABOLIC PANEL
ALT: 23 U/L (ref 0–44)
AST: 38 U/L (ref 15–41)
Albumin: 3.4 g/dL — ABNORMAL LOW (ref 3.5–5.0)
Alkaline Phosphatase: 172 U/L — ABNORMAL HIGH (ref 38–126)
Anion gap: 18 — ABNORMAL HIGH (ref 5–15)
BUN: 19 mg/dL (ref 6–20)
CO2: 24 mmol/L (ref 22–32)
Calcium: 9.6 mg/dL (ref 8.9–10.3)
Chloride: 92 mmol/L — ABNORMAL LOW (ref 98–111)
Creatinine, Ser: 0.89 mg/dL (ref 0.44–1.00)
GFR, Estimated: >60 mL/min (ref >60)
Glucose, Bld: 296 mg/dL — ABNORMAL HIGH (ref 70–99)
Potassium: 4.5 mmol/L (ref 3.5–5.1)
Sodium: 134 mmol/L — ABNORMAL LOW (ref 135–145)
Total Bilirubin: 3.5 mg/dL — ABNORMAL HIGH (ref 0.3–1.2)
Total Protein: 8.7 g/dL — ABNORMAL HIGH (ref 6.5–8.1)

## 2022-11-18 LAB — OCCULT BLOOD GASTRIC / DUODENUM (SPECIMEN CUP)
Occult Blood, Gastric: NEGATIVE
pH, Gastric: 2

## 2022-11-18 LAB — LIPASE, BLOOD: Lipase: 33 U/L (ref 11–51)

## 2022-11-18 LAB — GLUCOSE, CAPILLARY: Glucose-Capillary: 237 mg/dL — ABNORMAL HIGH (ref 70–99)

## 2022-11-18 LAB — I-STAT BETA HCG BLOOD, ED (MC, WL, AP ONLY): I-stat hCG, quantitative: <5 m[IU]/mL (ref <5)

## 2022-11-18 LAB — POC OCCULT BLOOD, ED
Fecal Occult Bld: NEGATIVE
Fecal Occult Bld: POSITIVE — AB

## 2022-11-18 MED ORDER — ESCITALOPRAM OXALATE 20 MG PO TABS
10.0000 mg | ORAL_TABLET | Freq: Every day | ORAL | Status: DC
  Administered 2022-11-18 – 2022-11-21 (×4): 10 mg via ORAL
  Filled 2022-11-18 (×4): qty 1

## 2022-11-18 MED ORDER — MORPHINE SULFATE (PF) 4 MG/ML IV SOLN
4.0000 mg | Freq: Once | INTRAVENOUS | Status: AC
  Administered 2022-11-18: 4 mg via INTRAVENOUS
  Filled 2022-11-18: qty 1

## 2022-11-18 MED ORDER — PANTOPRAZOLE SODIUM 40 MG IV SOLR
40.0000 mg | Freq: Two times a day (BID) | INTRAVENOUS | Status: DC
  Administered 2022-11-18 – 2022-11-22 (×8): 40 mg via INTRAVENOUS
  Filled 2022-11-18 (×8): qty 10

## 2022-11-18 MED ORDER — IOHEXOL 350 MG/ML SOLN
100.0000 mL | Freq: Once | INTRAVENOUS | Status: AC | PRN
  Administered 2022-11-18: 100 mL via INTRAVENOUS

## 2022-11-18 MED ORDER — OXYCODONE HCL 5 MG PO TABS
5.0000 mg | ORAL_TABLET | Freq: Once | ORAL | Status: AC
  Administered 2022-11-18: 5 mg via ORAL
  Filled 2022-11-18: qty 1

## 2022-11-18 MED ORDER — INSULIN ASPART 100 UNIT/ML IJ SOLN
0.0000 [IU] | Freq: Three times a day (TID) | INTRAMUSCULAR | Status: DC
  Administered 2022-11-19: 3 [IU] via SUBCUTANEOUS
  Administered 2022-11-19: 2 [IU] via SUBCUTANEOUS
  Administered 2022-11-19 – 2022-11-20 (×3): 3 [IU] via SUBCUTANEOUS
  Administered 2022-11-20: 2 [IU] via SUBCUTANEOUS
  Administered 2022-11-21: 5 [IU] via SUBCUTANEOUS
  Administered 2022-11-21 (×2): 3 [IU] via SUBCUTANEOUS
  Administered 2022-11-22: 9 [IU] via SUBCUTANEOUS
  Administered 2022-11-22: 3 [IU] via SUBCUTANEOUS
  Filled 2022-11-18: qty 0.09

## 2022-11-18 MED ORDER — ORAL CARE MOUTH RINSE: 15.0000 mL | OROMUCOSAL | Status: DC | PRN

## 2022-11-18 MED ORDER — HYDROXYZINE HCL 10 MG PO TABS
10.0000 mg | ORAL_TABLET | Freq: Three times a day (TID) | ORAL | Status: DC | PRN
  Administered 2022-11-18 – 2022-11-21 (×4): 10 mg via ORAL
  Filled 2022-11-18 (×4): qty 1

## 2022-11-18 MED ORDER — TORSEMIDE 20 MG PO TABS
20.0000 mg | ORAL_TABLET | Freq: Every day | ORAL | Status: DC
  Administered 2022-11-19 – 2022-11-22 (×4): 20 mg via ORAL
  Filled 2022-11-18 (×4): qty 1

## 2022-11-18 MED ORDER — METOPROLOL TARTRATE 25 MG PO TABS
25.0000 mg | ORAL_TABLET | Freq: Two times a day (BID) | ORAL | Status: DC
  Administered 2022-11-18 – 2022-11-22 (×8): 25 mg via ORAL
  Filled 2022-11-18 (×8): qty 1

## 2022-11-18 MED ORDER — CHLORHEXIDINE GLUCONATE CLOTH 2 % EX PADS
6.0000 | MEDICATED_PAD | Freq: Every day | CUTANEOUS | Status: DC
  Administered 2022-11-19 – 2022-11-21 (×3): 6 via TOPICAL

## 2022-11-18 MED ORDER — ATORVASTATIN CALCIUM 40 MG PO TABS
80.0000 mg | ORAL_TABLET | Freq: Every day | ORAL | Status: DC
  Administered 2022-11-19 – 2022-11-21 (×3): 80 mg via ORAL
  Filled 2022-11-18 (×3): qty 2

## 2022-11-18 MED ORDER — ONDANSETRON HCL 4 MG PO TABS: 4.0000 mg | ORAL_TABLET | Freq: Four times a day (QID) | ORAL | Status: DC | PRN

## 2022-11-18 MED ORDER — ACETAMINOPHEN 650 MG RE SUPP: 650.0000 mg | Freq: Four times a day (QID) | RECTAL | Status: DC | PRN

## 2022-11-18 MED ORDER — ONDANSETRON HCL 4 MG/2ML IJ SOLN
4.0000 mg | Freq: Once | INTRAMUSCULAR | Status: AC
  Administered 2022-11-18: 4 mg via INTRAVENOUS
  Filled 2022-11-18: qty 2

## 2022-11-18 MED ORDER — GABAPENTIN 100 MG PO CAPS
100.0000 mg | ORAL_CAPSULE | Freq: Every day | ORAL | Status: DC
  Administered 2022-11-18 – 2022-11-21 (×4): 100 mg via ORAL
  Filled 2022-11-18 (×4): qty 1

## 2022-11-18 MED ORDER — ACETAMINOPHEN 500 MG PO TABS
1000.0000 mg | ORAL_TABLET | Freq: Four times a day (QID) | ORAL | Status: DC | PRN
  Administered 2022-11-18 – 2022-11-19 (×2): 1000 mg via ORAL
  Filled 2022-11-18 (×2): qty 2

## 2022-11-18 MED ORDER — SODIUM CHLORIDE 0.9 % IV BOLUS
500.0000 mL | Freq: Once | INTRAVENOUS | Status: AC
  Administered 2022-11-18: 500 mL via INTRAVENOUS

## 2022-11-18 MED ORDER — ONDANSETRON HCL 4 MG/2ML IJ SOLN: 4.0000 mg | Freq: Four times a day (QID) | INTRAMUSCULAR | Status: DC | PRN

## 2022-11-18 MED ORDER — INSULIN ASPART 100 UNIT/ML IJ SOLN
0.0000 [IU] | Freq: Every day | INTRAMUSCULAR | Status: DC
  Administered 2022-11-18 – 2022-11-20 (×2): 2 [IU] via SUBCUTANEOUS
  Filled 2022-11-18: qty 0.05

## 2022-11-18 MED ORDER — SENNOSIDES-DOCUSATE SODIUM 8.6-50 MG PO TABS
1.0000 | ORAL_TABLET | Freq: Every evening | ORAL | Status: DC | PRN
  Administered 2022-11-21: 1 via ORAL
  Filled 2022-11-18: qty 1

## 2022-11-18 MED ORDER — DILTIAZEM HCL-DEXTROSE 125-5 MG/125ML-% IV SOLN (PREMIX)
5.0000 mg/h | INTRAVENOUS | Status: DC
  Administered 2022-11-18: 5 mg/h via INTRAVENOUS
  Filled 2022-11-18: qty 125

## 2022-11-18 MED ORDER — PANTOPRAZOLE SODIUM 40 MG IV SOLR
40.0000 mg | Freq: Once | INTRAVENOUS | Status: AC
  Administered 2022-11-18: 40 mg via INTRAVENOUS
  Filled 2022-11-18: qty 10

## 2022-11-18 MED ORDER — INSULIN ASPART 100 UNIT/ML IJ SOLN
5.0000 [IU] | Freq: Once | INTRAMUSCULAR | Status: AC
  Administered 2022-11-18: 5 [IU] via SUBCUTANEOUS
  Filled 2022-11-18: qty 0.05

## 2022-11-18 MED ORDER — DILTIAZEM LOAD VIA INFUSION
15.0000 mg | Freq: Once | INTRAVENOUS | Status: AC
  Administered 2022-11-18: 15 mg via INTRAVENOUS
  Filled 2022-11-18: qty 15

## 2022-11-18 NOTE — ED Triage Notes (Signed)
Pt BIBA from home with c/o chest pain with N/V. Chest pain x5 days. Seen at [facility] 5 days ago for same. Past 48 hour N/V. Past 24 hours coffee ground emesis. Left shoulder pain. Stroke last month.  Left side weakness. Allergic NSAIDS. A&O x4   BP 150/100 HR 124 100% RA CBG 292

## 2022-11-18 NOTE — ED Provider Notes (Signed)
I provided a substantive portion of the care of this patient.  I personally made/approved the management plan for this patient and take responsibility for the patient management. {Remember to document shared critical care using "edcritical" dot phrase:1}    

## 2022-11-18 NOTE — Hospital Course (Signed)
[name] is a 47 y.o. 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

## 2022-11-18 NOTE — ED Notes (Signed)
ED TO INPATIENT HANDOFF REPORT  ED Nurse Name and Phone #: Deon Pilling [phone]  S Name/Age/Gender [name] 47 y.o. female Room/Bed: WA06/WA06  Code Status   Code Status: Full Code  Home/SNF/Other Home Patient oriented to: self, place, time, and situation Is this baseline? Yes   Triage Complete: Triage complete  Chief Complaint Coffee ground emesis [K92.0]  Triage Note Pt BIBA from home with c/o chest pain with N/V. Chest pain x5 days. Seen at [facility] 5 days ago for same. Past 48 hour N/V. Past 24 hours coffee ground emesis. Left shoulder pain. Stroke last month.  Left side weakness. Allergic NSAIDS. A&O x4   BP 150/100 HR 124 100% RA CBG 292     Allergies Allergies  Allergen Reactions   Fish Allergy Swelling   Shellfish Allergy Anaphylaxis, Swelling and Other (See Comments)    Airway involvement including EMS - Has Epi-Pen   Metformin And Related Diarrhea   Trulicity [Dulaglutide] Nausea And Vomiting and Other (See Comments)    Multiple episodes of vomiting over multiple days   Nsaids Other (See Comments)    Per PCP interferes with daily meds (heart failure)   Advil [Ibuprofen] Other (See Comments)    Per PCP interferes with daily meds (heart failure)    Level of Care/Admitting Diagnosis ED Disposition     ED Disposition  Admit   Condition  --   Comment  Hospital Area: [facility] [[id number]]  Level of Care: Stepdown [14]  Admit to SDU based on following criteria: Cardiac Instability:  Patients experiencing chest pain, unconfirmed MI and stable, arrhythmias and CHF requiring medical management and potentially compromising patient's stability  May admit patient to [facility] or [facility] if equivalent level of care is available:: No  Covid Evaluation: Asymptomatic - no recent exposure (last 10 days) testing not required  Diagnosis: Coffee ground emesis [[id number]]  Admitting Physician: Charlsie Quest [[id number]]  Attending Physician: Charlsie Quest [[id number]]  Certification:: I certify this patient will need inpatient services for at least 2 midnights  Estimated Length of Stay: 2          B Medical/Surgery History Past Medical History:  Diagnosis Date   Abnormal Pap smear    Acute combined systolic and diastolic heart failure (HCC) 02/19/2019   Wt Readings from Last 3 Encounters:  09/18/21  84.7 kg  09/02/21  84.4 kg  08/31/21  84.2 kg         Acute heart failure (HCC) 01/29/2022   Acute on chronic combined systolic and diastolic CHF (congestive heart failure) (HCC) 02/19/2019   Acute on chronic congestive heart failure (HCC)    Acute on chronic congestive heart failure (HCC)    Acute on chronic diastolic CHF (congestive heart failure) (HCC) 10/19/2021   Acute on chronic systolic heart failure (HCC) 02/22/2022   Anemia    Asthma    Atrial fibrillation (HCC)    history of paroxysmal atrial fibrillation   CHF (congestive heart failure) (HCC) 11/02/2021   CHF exacerbation (HCC) 10/17/2021   Diabetes mellitus    DKA, type 2 (HCC) 02/06/2019   Elevated troponin 09/30/2019   Heart failure with reduced ejection fraction (HCC)    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  Consider chronic etiology given continued fertility issues   Hypertension    Hypertensive emergency 09/30/2019   Hypertensive urgency 06/02/2022   Major depressive disorder 02/08/2012  Reports that 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     Migraines    Morbid obesity (HCC) 02/06/2019   Nausea & vomiting 06/26/2022   Nonischemic cardiomyopathy (HCC) 09/22/2021   NSTEMI (non-ST elevated myocardial infarction) (HCC) 02/23/2022   Obstructive sleep apnea 06/30/2015   Polycystic ovarian disease    Past Surgical History:  Procedure Laterality Date   BIOPSY  11/05/2021   Procedure: BIOPSY;  Surgeon: Jenel Lucks, MD;  Location:  [facility] ENDOSCOPY;  Service: Gastroenterology;;   CERVIX LESION DESTRUCTION     CESAREAN SECTION     ESOPHAGOGASTRODUODENOSCOPY (EGD) WITH PROPOFOL N/A 11/05/2021   Procedure: ESOPHAGOGASTRODUODENOSCOPY (EGD) WITH PROPOFOL;  Surgeon: Jenel Lucks, MD;  Location: [facility] ENDOSCOPY;  Service: Gastroenterology;  Laterality: N/A;   IR CT HEAD LTD  09/24/2022   IR PERCUTANEOUS ART THROMBECTOMY/INFUSION INTRACRANIAL INC DIAG ANGIO  09/24/2022   RADIOLOGY WITH ANESTHESIA N/A 09/24/2022   Procedure: IR WITH ANESTHESIA;  Surgeon: Radiologist, Medication, MD;  Location: [facility] OR;  Service: Radiology;  Laterality: N/A;   RIGHT/LEFT HEART CATH AND CORONARY ANGIOGRAPHY N/A 03/29/2019   Procedure: RIGHT/LEFT HEART CATH AND CORONARY ANGIOGRAPHY;  Surgeon: Laurey Morale, MD;  Location: [facility] INVASIVE CV LAB;  Service: Cardiovascular;  Laterality: N/A;   RIGHT/LEFT HEART CATH AND CORONARY ANGIOGRAPHY N/A 09/20/2021   Procedure: RIGHT/LEFT HEART CATH AND CORONARY ANGIOGRAPHY;  Surgeon: Laurey Morale, MD;  Location: [facility] INVASIVE CV LAB;  Service: Cardiovascular;  Laterality: N/A;     A IV Location/Drains/Wounds Patient Lines/Drains/Airways Status     Active Line/Drains/Airways     Name Placement date Placement time Site Days   Peripheral IV 11/18/22 20 G 2.5" Anterior;Left;Upper Arm 11/18/22  1130  Arm  less than 1            Intake/Output Last 24 hours No intake or output data in the 24 hours ending 11/18/22 2026  Labs/Imaging Results for orders placed or performed during the hospital encounter of 11/18/22 (from the past 48 hour(s))  Urinalysis, Routine w reflex microscopic -Urine, Clean Catch     Status: Abnormal   Collection Time: 11/18/22 10:04 AM  Result Value Ref Range   Color, Urine RED (A) YELLOW   APPearance TURBID (A) CLEAR   Specific Gravity, Urine 1.035 (H) 1.005 - 1.030   pH 5.0 5.0 - 8.0   Glucose, UA >=500 (A) NEGATIVE mg/dL   Hgb urine dipstick NEGATIVE NEGATIVE   Bilirubin Urine SMALL (A)  NEGATIVE   Ketones, ur NEGATIVE NEGATIVE mg/dL   Protein, ur >=161 (A) NEGATIVE mg/dL   Nitrite NEGATIVE NEGATIVE   Leukocytes,Ua NEGATIVE NEGATIVE   RBC / HPF 6-10 0 - 5 RBC/hpf   WBC, UA 21-50 0 - 5 WBC/hpf   Bacteria, UA NONE SEEN NONE SEEN   Squamous Epithelial / HPF 21-50 0 - 5 /HPF   Mucus PRESENT    Hyaline Casts, UA PRESENT     Comment: Performed at [facility], [address] [place], [place] [address]  CBC with Differential     Status: Abnormal   Collection Time: 11/18/22 11:19 AM  Result Value Ref Range   WBC 14.0 (H) 4.0 - 10.5 K/uL   RBC 6.47 (H) 3.87 - 5.11 MIL/uL   Hemoglobin 14.4 12.0 - 15.0 g/dL   HCT 54.0 98.1 - 19.1 %   MCV 71.1 (L) 80.0 - 100.0 fL   MCH 22.3 (L) 26.0 - 34.0  pg   MCHC 31.3 30.0 - 36.0 g/dL   RDW 40.9 (H) 81.1 - 91.4 %   Platelets 303 150 - 400 K/uL   nRBC 0.0 0.0 - 0.2 %   Neutrophils Relative % 82 %   Neutro Abs 11.4 (H) 1.7 - 7.7 K/uL   Lymphocytes Relative 13 %   Lymphs Abs 1.8 0.7 - 4.0 K/uL   Monocytes Relative 5 %   Monocytes Absolute 0.7 0.1 - 1.0 K/uL   Eosinophils Relative 0 %   Eosinophils Absolute 0.0 0.0 - 0.5 K/uL   Basophils Relative 0 %   Basophils Absolute 0.1 0.0 - 0.1 K/uL   Immature Granulocytes 0 %   Abs Immature Granulocytes 0.06 0.00 - 0.07 K/uL    Comment: Performed at [facility], [address], [place], [place] [address]  Comprehensive metabolic panel     Status: Abnormal   Collection Time: 11/18/22 11:19 AM  Result Value Ref Range   Sodium 134 (L) 135 - 145 mmol/L   Potassium 4.5 3.5 - 5.1 mmol/L   Chloride 92 (L) 98 - 111 mmol/L   CO2 24 22 - 32 mmol/L   Glucose, Bld 296 (H) 70 - 99 mg/dL    Comment: Glucose reference range applies only to samples taken after fasting for at least 8 hours.   BUN 19 6 - 20 mg/dL   Creatinine, Ser 6.21 0.44 - 1.00 mg/dL   Calcium 9.6 8.9 - 30.8 mg/dL   Total Protein 8.7 (H) 6.5 - 8.1 g/dL   Albumin 3.4 (L) 3.5 - 5.0 g/dL   AST 38  15 - 41 U/L   ALT 23 0 - 44 U/L   Alkaline Phosphatase 172 (H) 38 - 126 U/L   Total Bilirubin 3.5 (H) 0.3 - 1.2 mg/dL   GFR, Estimated >65 >78 mL/min    Comment: (NOTE) Calculated using the CKD-EPI Creatinine Equation (2021)    Anion gap 18 (H) 5 - 15    Comment: Performed at [facility], [address], [place], [place] [address]  Lipase, blood     Status: None   Collection Time: 11/18/22 11:19 AM  Result Value Ref Range   Lipase 33 11 - 51 U/L    Comment: Performed at [facility], [address], [place], [place] [address]  I-STAT beta hCG blood, ED     Status: None   Collection Time: 11/18/22 11:32 AM  Result Value Ref Range   I-stat hCG, quantitative <5.0 <5 mIU/mL   Comment 3            Comment:   GEST. AGE      CONC.  (mIU/mL)   <=1 WEEK        5 - 50     2 WEEKS       50 - 500     3 WEEKS       100 - 10,000     4 WEEKS     1,000 - 30,000        FEMALE AND NON-PREGNANT FEMALE:     LESS THAN 5 mIU/mL   POC occult blood, ED     Status: None   Collection Time: 11/18/22  1:28 PM  Result Value Ref Range   Fecal Occult Bld NEGATIVE NEGATIVE  POC occult blood, ED     Status: Abnormal   Collection Time: 11/18/22  2:43 PM  Result Value Ref Range   Fecal Occult Bld POSITIVE (  A) NEGATIVE  Occult bld gastric/duodenum (cup to lab)     Status: None   Collection Time: 11/18/22  3:16 PM  Result Value Ref Range   pH, Gastric 2    Occult Blood, Gastric NEGATIVE NEGATIVE    Comment: Performed at [facility], [address], [place], [place] [address]   CT ANGIO GI BLEED  Result Date: 11/18/2022 CLINICAL DATA:  Coffee-ground emesis, lower GI bleed EXAM: CTA ABDOMEN AND PELVIS WITHOUT AND WITH CONTRAST TECHNIQUE: Multidetector CT imaging of the abdomen and pelvis was performed using the standard protocol during bolus administration of intravenous contrast. Multiplanar reconstructed images and MIPs were obtained and reviewed to  evaluate the vascular anatomy. RADIATION DOSE REDUCTION: This exam was performed according to the departmental dose-optimization program which includes automated exposure control, adjustment of the mA and/or kV according to patient size and/or use of iterative reconstruction technique. CONTRAST:  OMNIPAQUE IOHEXOL 350 MG/ML SOLN COMPARISON:  None Available. FINDINGS: VASCULAR Normal contour and caliber of the abdominal aorta. No evidence of aneurysm, dissection, or other acute aortic pathology. Standard branching pattern of the abdominal aorta with solitary bilateral renal arteries. Moderate mixed calcific atherosclerosis. Review of the MIP images confirms the above findings. NON-VASCULAR Lower chest: Cardiomegaly.  Coronary artery calcifications. Hepatobiliary: No solid liver abnormality is seen. No gallstones, gallbladder wall thickening, or biliary dilatation. Pancreas: Unremarkable. No pancreatic ductal dilatation or surrounding inflammatory changes. Spleen: Normal in size without significant abnormality. Adrenals/Urinary Tract: Adrenal glands are unremarkable simple, benign bilateral renal cortical cysts, for which no further follow-up or characterization is required. Kidneys are otherwise normal, without renal calculi, solid lesion, or hydronephrosis. Bladder is unremarkable. Stomach/Bowel: Stomach is within normal limits. Appendix appears normal. No evidence of bowel wall thickening, distention, or inflammatory changes. Lymphatic: No enlarged abdominal or pelvic lymph nodes. Reproductive: No mass or other significant abnormality. Other: No abdominal wall hernia or abnormality. No ascites. Musculoskeletal: No acute osseous findings. IMPRESSION: 1. No intraluminal contrast extravasation or other findings to localize lower GI bleeding. 2. Normal contour and caliber of the abdominal aorta. No evidence of aneurysm, dissection, or other acute aortic pathology. 3. Moderate mixed calcific atherosclerosis. 4.  Cardiomegaly and coronary artery disease. Electronically Signed   By: Jearld Lesch M.D.   On: 11/18/2022 18:00    Pending Labs Unresulted Labs (From admission, onward)     Start     Ordered   11/19/22 0500  HIV Antibody (routine testing w rflx)  (HIV Antibody (Routine testing w reflex) panel)  Tomorrow morning,   R       Question:  Specimen collection method  Answer:  IV Team=IV Team collect   11/18/22 1937   11/19/22 0500  CBC  Tomorrow morning,   R       Question:  Specimen collection method  Answer:  IV Team=IV Team collect   11/18/22 1937   11/19/22 0500  Comprehensive metabolic panel  Tomorrow morning,   R       Question:  Specimen collection method  Answer:  IV Team=IV Team collect   11/18/22 1937            Vitals/Pain Today's Vitals   11/18/22 1425 11/18/22 1635 11/18/22 1912 11/18/22 1912  BP:  130/80    Pulse:  95    Resp:  14    Temp: 98.4 F (36.9 C) 98.1 F (36.7 C)    TempSrc: Oral Oral    SpO2:  100%    PainSc:  8  8     Isolation Precautions No active isolations  Medications Medications  pantoprazole (PROTONIX) injection 40 mg (has no administration in time range)  acetaminophen (TYLENOL) tablet 1,000 mg (has no administration in time range)    Or  acetaminophen (TYLENOL) suppository 650 mg (has no administration in time range)  ondansetron (ZOFRAN) tablet 4 mg (has no administration in time range)    Or  ondansetron (ZOFRAN) injection 4 mg (has no administration in time range)  senna-docusate (Senokot-S) tablet 1 tablet (has no administration in time range)  insulin aspart (novoLOG) injection 0-9 Units (has no administration in time range)  insulin aspart (novoLOG) injection 0-5 Units (has no administration in time range)  atorvastatin (LIPITOR) tablet 80 mg (has no administration in time range)  escitalopram (LEXAPRO) tablet 10 mg (has no administration in time range)  gabapentin (NEURONTIN) capsule 100 mg (has no administration in time range)   hydrOXYzine (ATARAX) tablet 10 mg (has no administration in time range)  metoprolol tartrate (LOPRESSOR) tablet 25 mg (has no administration in time range)  torsemide (DEMADEX) tablet 20 mg (has no administration in time range)  diltiazem (CARDIZEM) 1 mg/mL load via infusion 15 mg (15 mg Intravenous Bolus from Bag 11/18/22 1147)  pantoprazole (PROTONIX) injection 40 mg (40 mg Intravenous Given 11/18/22 1201)  morphine (PF) 4 MG/ML injection 4 mg (4 mg Intravenous Given 11/18/22 1201)  insulin aspart (novoLOG) injection 5 Units (5 Units Subcutaneous Given 11/18/22 1459)  morphine (PF) 4 MG/ML injection 4 mg (4 mg Intravenous Given 11/18/22 1601)  sodium chloride 0.9 % bolus 500 mL (0 mLs Intravenous Stopped 11/18/22 1714)  iohexol (OMNIPAQUE) 350 MG/ML injection 100 mL (100 mLs Intravenous Contrast Given 11/18/22 1649)  ondansetron (ZOFRAN) injection 4 mg (4 mg Intravenous Given 11/18/22 1713)    Mobility non-ambulatory     Focused Assessments    R Recommendations: See Admitting Provider Note  Report given to:   Additional Notes:

## 2022-11-18 NOTE — ED Provider Notes (Signed)
Patient signed out to me from previous provider, see their note for detailed history.  Briefly summarized patient is a 48 year old female with medical history of atrial fibrillation on Eliquis presenting to the emergency department due to potential GI bleed.  She has been having coffee-ground emesis and melena for the last 3 days.  Fecal occult was positive, hemoglobin reassuring at 14 patient was in A-fib with RVR during ED visit and required Cardizem drip.  CT angio ordered and pending at time of signout.  CT angio was negative for any acute process.   I consulted with [facility] gastroenterology, spoke with Dr. Pati Gallo.  They will consult on the patient.  Consulted with hospitalist for admission for AF with RVR in the setting of coffee-ground emesis and melanotic stool.       Theron Arista, PA-C 11/18/22 0981    Rolan Bucco, MD 11/18/22 2159

## 2022-11-18 NOTE — ED Provider Notes (Signed)
[place] AT [facility] Provider Note   CSN: [id number] Arrival date & time: 11/18/22  1000     History  No chief complaint on file.   ALEAN MISHKIN is a 47 y.o. female.  The history is provided by the patient and medical records. No language interpreter was used.     47 year old female with significant history of paroxysmal atrial fibrillation currently on Eliquis, CHF, diabetes, hypertension, brought here via EMS from home with complaints of chest pain.  Patient report for the past 3 days she has had pain in his chest.  Described pain as a sharp pressure sensation follows with persistent nausea, has vomited copious time of coffee-ground colored emesis.  Daughter also noticed that her stool has blood in it.  Chest pain is moderate in intensity without lightheadedness or dizziness no shortness of breath no back pain.  Patient denies alcohol use or NSAID use.  She denies having abnormal bleeding in the past.  Denies any recent trauma.  Home Medications Prior to Admission medications   Medication Sig Start Date End Date Taking? Authorizing Provider  Accu-Chek Softclix Lancets lancets Use up 4 (four) times daily as directed 06/06/22   [place], Alvira Philips, DO  acetaminophen (TYLENOL) 500 MG tablet Take 500-1,000 mg by mouth every 6 (six) hours as needed for mild pain or headache.    [provider]  amantadine (SYMMETREL) 100 MG capsule Take 2 capsules (200 mg total) by mouth daily. 11/01/22   Love, Evlyn Kanner, PA-C  apixaban (ELIQUIS) 5 MG TABS tablet Take 1 tablet (5 mg total) by mouth 2 (two) times daily. 10/31/22   Love, Evlyn Kanner, PA-C  atorvastatin (LIPITOR) 80 MG tablet Take 1 tablet (80 mg total) by mouth daily after supper. 10/31/22   Love, Evlyn Kanner, PA-C  blood glucose meter kit and supplies Use up to 4 (four) times daily as directed 06/06/22   [place], Alvira Philips, DO  Blood Glucose Monitoring Suppl DEVI 1 each by Does not apply route in the morning, at  noon, and at bedtime. May substitute to any manufacturer covered by patient's insurance. 11/01/22   Love, Evlyn Kanner, PA-C  calcium carbonate (TUMS - DOSED IN MG ELEMENTAL CALCIUM) 500 MG chewable tablet Chew 1 tablet (200 mg of elemental calcium total) by mouth daily after supper. 10/31/22   Love, Evlyn Kanner, PA-C  docusate sodium (COLACE) 100 MG capsule Take 1 capsule (100 mg total) by mouth daily. 11/01/22   Love, Evlyn Kanner, PA-C  empagliflozin (JARDIANCE) 25 MG TABS tablet Take 1 tablet (25 mg total) by mouth daily. 11/02/22   Love, Evlyn Kanner, PA-C  escitalopram (LEXAPRO) 10 MG tablet Take 1 tablet (10 mg total) by mouth at bedtime. 10/31/22   Love, Evlyn Kanner, PA-C  gabapentin (NEURONTIN) 100 MG capsule Take 1 capsule (100 mg total) by mouth at bedtime. 11/11/22   Levin Erp, MD  glipiZIDE (GLUCOTROL) 5 MG tablet Take 1 tablet (5 mg total) by mouth daily before breakfast. 11/01/22   Love, Evlyn Kanner, PA-C  Glucose Blood (BLOOD GLUCOSE TEST STRIPS) STRP Inject 1 each into the skin 4 (four) times daily -  before meals and at bedtime. May substitute to any manufacturer covered by patient's insurance. 11/01/22 12/01/22  Jacquelynn Cree, PA-C  glucose blood (ONETOUCH VERIO) test strip Use up to 4 (four) times daily as directed 06/06/22   [place], Alvira Philips, DO  hydrOXYzine (ATARAX) 10 MG tablet Take 1 tablet (10 mg total) by  mouth 3 (three) times daily as needed. 10/31/22   Jacquelynn Cree, PA-C  Lancet Device MISC 1 each by Does not apply route in the morning, at noon, and at bedtime. May substitute to any manufacturer covered by patient's insurance. 11/01/22 12/01/22  Jacquelynn Cree, PA-C  Lancets Misc. MISC 1 each by Does not apply route in the morning, at noon, and at bedtime. May substitute to any manufacturer covered by patient's insurance. 11/01/22 12/01/22  Love, Evlyn Kanner, PA-C  losartan (COZAAR) 50 MG tablet Take 1 tablet (50 mg total) by mouth daily. 10/31/22   Love, Evlyn Kanner, PA-C  megestrol (MEGACE) 400 MG/10ML suspension Take  10 mLs (400 mg total) by mouth 2 (two) times daily. 10/31/22   Love, Evlyn Kanner, PA-C  melatonin 5 MG TABS Take 1 tablet (5 mg total) by mouth at bedtime. 10/31/22   Love, Evlyn Kanner, PA-C  Menthol-Methyl Salicylate (MUSCLE RUB) 10-15 % CREA Apply 1 Application topically 3 (three) times daily. 10/31/22   Love, Evlyn Kanner, PA-C  methocarbamol (ROBAXIN) 500 MG tablet Take 1 tablet (500 mg total) by mouth every 6 (six) hours as needed for muscle spasms. 10/31/22   Love, Evlyn Kanner, PA-C  metoprolol tartrate (LOPRESSOR) 25 MG tablet Take 1 tablet (25 mg total) by mouth 2 (two) times daily. 10/31/22   Love, Evlyn Kanner, PA-C  morphine (MSIR) 15 MG tablet Take 1 tablet (15 mg total) by mouth every 8 (eight) hours as needed for severe pain. 10/31/22   Love, Evlyn Kanner, PA-C  Multiple Vitamin (MULTIVITAMIN WITH MINERALS) TABS tablet Take 1 tablet by mouth daily. 10/02/22   de [place] Halsted, Cortney E, NP  pantoprazole (PROTONIX) 40 MG tablet Take 1 tablet (40 mg total) by mouth at bedtime. 10/31/22   Love, Evlyn Kanner, PA-C  polyethylene glycol powder (GLYCOLAX/MIRALAX) 17 GM/SCOOP powder Take 1 capful (17 g) by mouth 2 (two) times daily. 10/31/22   Love, Evlyn Kanner, PA-C  senna-docusate (SENOKOT-S) 8.6-50 MG tablet Take 3 tablets by mouth daily with lunch. 10/31/22   Love, Evlyn Kanner, PA-C  spironolactone (ALDACTONE) 25 MG tablet Take 1 tablet (25 mg total) by mouth daily. 11/01/22   Love, Evlyn Kanner, PA-C  torsemide (DEMADEX) 20 MG tablet Take 1 tablet (20 mg total) by mouth daily. 11/01/22   Love, Evlyn Kanner, PA-C  traZODone (DESYREL) 50 MG tablet Take 0.5 tablets (25 mg total) by mouth at bedtime. 10/31/22   Love, Evlyn Kanner, PA-C  Vitamin D, Ergocalciferol, (DRISDOL) 1.25 MG (50000 UNIT) CAPS capsule Take 1 capsule (50,000 Units total) by mouth every 7 (seven) days. 10/31/22   Love, Evlyn Kanner, PA-C  carvedilol (COREG) 3.125 MG tablet Take 1 tablet (3.125 mg total) by mouth 2 (two) times daily with a meal. 02/27/22 07/01/22  Meredeth Ide, MD      Allergies     Fish allergy, Shellfish allergy, Metformin and related, Trulicity [dulaglutide], Nsaids, and Advil [ibuprofen]    Review of Systems   Review of Systems  All other systems reviewed and are negative.   Physical Exam Updated Vital Signs BP (!) 153/120 (BP Location: Right Arm)   Pulse (!) 126   Temp 98.2 F (36.8 C) (Oral)   Resp 19   SpO2 100%  Physical Exam Vitals and nursing note reviewed.  Constitutional:      General: She is not in acute distress.    Appearance: She is well-developed.     Comments: Chronically ill-appearing female laying in bed appears slightly  uncomfortable but nontoxic.  HENT:     Head: Atraumatic.     Mouth/Throat:     Mouth: Mucous membranes are moist.     Pharynx: Oropharynx is clear.  Eyes:     Conjunctiva/sclera: Conjunctivae normal.  Cardiovascular:     Rate and Rhythm: Tachycardia present. Rhythm irregular.  Pulmonary:     Effort: Pulmonary effort is normal.     Breath sounds: No wheezing or rhonchi.  Abdominal:     Palpations: Abdomen is soft.     Tenderness: There is abdominal tenderness (Mild epigastric tenderness no guarding or rebound tenderness).  Musculoskeletal:     Cervical back: Neck supple.  Skin:    Findings: No rash.  Neurological:     Mental Status: She is alert.  Psychiatric:        Mood and Affect: Mood normal.     ED Results / Procedures / Treatments   Labs (all labs ordered are listed, but only abnormal results are displayed) Labs Reviewed  CBC WITH DIFFERENTIAL/PLATELET - Abnormal; Notable for the following components:      Result Value   WBC 14.0 (*)    RBC 6.47 (*)    MCV 71.1 (*)    MCH 22.3 (*)    RDW 22.3 (*)    Neutro Abs 11.4 (*)    All other components within normal limits  COMPREHENSIVE METABOLIC PANEL - Abnormal; Notable for the following components:   Sodium 134 (*)    Chloride 92 (*)    Glucose, Bld 296 (*)    Total Protein 8.7 (*)    Albumin 3.4 (*)    Alkaline Phosphatase 172 (*)    Total  Bilirubin 3.5 (*)    Anion gap 18 (*)    All other components within normal limits  POC OCCULT BLOOD, ED - Abnormal; Notable for the following components:   Fecal Occult Bld POSITIVE (*)    All other components within normal limits  LIPASE, BLOOD  URINALYSIS, ROUTINE W REFLEX MICROSCOPIC  OCCULT BLOOD GASTRIC / DUODENUM (SPECIMEN CUP)  I-STAT BETA HCG BLOOD, ED ([facility], [facility], [facility] ONLY)  POC OCCULT BLOOD, ED    EKG None ED ECG REPORT   Date: 11/18/2022  Rate: 117  Rhythm: sinus tachycardia  QRS Axis: right  Intervals: normal  ST/T Wave abnormalities: nonspecific ST changes  Conduction Disutrbances:none  Narrative Interpretation:   Old EKG Reviewed: unchanged  I have personally reviewed the EKG tracing and agree with the computerized printout as noted.   Radiology No results found.  Procedures .Critical Care  Performed by: Fayrene Helper, PA-C Authorized by: Fayrene Helper, PA-C   Critical care provider statement:    Critical care time (minutes):  30   Critical care was time spent personally by me on the following activities:  Development of treatment plan with patient or surrogate, discussions with consultants, evaluation of patient's response to treatment, examination of patient, ordering and review of laboratory studies, ordering and review of radiographic studies, ordering and performing treatments and interventions, pulse oximetry, re-evaluation of patient's condition and review of old charts     Medications Ordered in ED Medications  diltiazem (CARDIZEM) 125 mg in dextrose 5% 125 mL (1 mg/mL) infusion (5 mg/hr Intravenous New Bag/Given 11/18/22 1156)  morphine (PF) 4 MG/ML injection 4 mg (has no administration in time range)  sodium chloride 0.9 % bolus 500 mL (has no administration in time range)  diltiazem (CARDIZEM) 1 mg/mL load via infusion 15 mg (15 mg Intravenous Bolus  from Bag 11/18/22 1147)  pantoprazole (PROTONIX) injection 40 mg (40 mg Intravenous Given 11/18/22 1201)   morphine (PF) 4 MG/ML injection 4 mg (4 mg Intravenous Given 11/18/22 1201)  insulin aspart (novoLOG) injection 5 Units (5 Units Subcutaneous Given 11/18/22 1459)    ED Course/ Medical Decision Making/ A&P                             Medical Decision Making Amount and/or Complexity of Data Reviewed Labs: ordered. ECG/medicine tests: ordered.  Risk Prescription drug management.   BP (!) 153/120 (BP Location: Right Arm)   Pulse (!) 126   Temp 98.2 F (36.8 C) (Oral)   Resp 19   SpO2 100%   74:75 AM  47 year old female with significant history of paroxysmal atrial fibrillation currently on Eliquis, CHF, diabetes, hypertension, brought here via EMS from home with complaints of chest pain.  Patient report for the past 3 days she has had pain in his chest.  Described pain as a sharp pressure sensation follows with persistent nausea, has vomited copious time of coffee-ground colored emesis.  Daughter also noticed that her stool has blood in it.  Chest pain is moderate in intensity without lightheadedness or dizziness no shortness of breath no back pain.  Patient denies alcohol use or NSAID use.  She denies having abnormal bleeding in the past.  Denies any recent trauma.  Exam notable for small amount of blood noted on the lips, oral mucosa normal no signs of airway compromise.  Heart with tachycardia along with irregularly irregular heart rhythm, lungs otherwise clear abdomen is soft with some mild epigastric tenderness but no guarding or rebound tenderness.  Vital signs notable for elevated blood pressure of 153/120.  Patient is also tachycardic with heart rate between 120s to 140s.  No hypoxia.  I suspect patient is experiencing upper GI bleed in the setting of being on blood thinner medication.  She would benefit from Protonix.  She is also was notable irregularly irregular tachycardia on the monitor suggestive of atrial fibrillation with RVR, however due to history of CHF, fluid  resuscitation can potentially worsen the condition.  Therefore will provide patient with rate control including diltiazem bolus and drip.  11:31 AM There was a delay in obtaining labs as patient is a difficult IV access.  Unfortunately IV team was able to establish IV access, will provide supportive care and workup continues.  2:24 PM Labs remarkable for elevated white count of 14.0.  Hemoglobin is stable at 14.4.  Her Hemoccult is negative.  Stool, with normal color.  Pregnancy test is negative.  Evidence of hyperglycemia with a CBG of 296 with an anion gap of 18.  -The patient was maintained on a cardiac monitor.  I personally viewed and interpreted the cardiac monitored which showed an underlying rhythm of: sinus tachycardia -Imaging including CT angio GI bleed which have been ordered but have not resulted yet -This patient presents to the ED for concern of abd pain, this involves an extensive number of treatment options, and is a complaint that carries with it a high risk of complications and morbidity.  The differential diagnosis includes upper GI bleed, lower GI bleed, gastritis, colitis, diverticular disease, pancreatitis -Co morbidities that complicate the patient evaluation includes DM, afib, CHF -Treatment includes protonix, cardizem, morphine, insulin -Reevaluation of the patient after these medicines showed that the patient improved -PCP office notes or outside notes reviewed -Discussion with attending Dr.  Pfeiffer -Escalation to admission/observation considered: patient sign out to oncoming provider who will call for admission once labs and imaging resulted.    Patient is still tachycardic, this is likely driven by pain.  Will give a small bolus of 500 cc fluid  3:32 PM Pt sign out to oncoming provider who will f/u with CT result,  consult for admission due to GI bleed.         Final Clinical Impression(s) / ED Diagnoses Final diagnoses:  None    Rx / DC Orders ED  Discharge Orders     None         Fayrene Helper, PA-C 11/18/22 1541    Arby Barrette, MD 11/22/22 1303

## 2022-11-18 NOTE — H&P (Signed)
History and Physical    [name] ZOX:[id number] DOB: 05/06/1976 DOA: 11/18/2022  PCP: Reece Leader, DO  Patient coming from: Home  I have personally briefly reviewed patient's old medical records in [facility]  Chief Complaint: Coffee-ground emesis  HPI: [name] is a 47 y.o. 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  Patient recently admitted 09/22/2022-10/01/2022 for right MCA stroke thought to be embolic from large LV thrombus in the setting of subtherapeutic Coumadin prior to admission.  She underwent IR endovascular revascularization.  Anticoagulation was changed to Eliquis.  She had brief cardiac arrest during IR procedure requiring 2 minutes of ACLS before ROSC achieved.  She was discharged to inpatient rehab and subsequently discharged to home from rehab 11/01/2022.  Patient states for the last few days she has been having frequent nausea and vomiting.  Emesis has reported coffee-ground appearance.  She is also noted some red blood in her stool and her daughter reported seen dark black-colored stool.  She has had some upper abdominal pain associated with this.    She has been taking Eliquis, she reports last dose was taking evening of 4/25.  She denies any similar bleeding episodes in the past. She denies any NSAID use.  She had an EGD performed 11/05/2021 which was normal.  Appears this was performed for evaluation of nausea and vomiting.  ED Course  Labs/Imaging on admission: I have personally reviewed following labs and imaging studies.  Initial vitals showed BP 153/120, pulse 126, RR 19, temp 98.2 F, SpO2 100% on room air.  Labs show WBC 14.0, hemoglobin 14.4, platelets 303,000, sodium 134, potassium 4.5, bicarb 24, BUN 19,  creatinine 0.89, serum glucose 296, AST 38, ALT 23, alk phos 172, total bilirubin 3.5, lipase 33.  FOBT is positive.  Urinalysis shows negative nitrites, negative leukocytes, 6-10 RBCs, 21-50 WBCs, no bacteria.  CTA GI bleed study negative for intraluminal contrast extravasation or other findings to localize lower GI bleeding.  Normal contour and caliber of abdominal aorta noted.  No evidence of aneurysm, dissection, or other acute aortic pathology.  Cardiomegaly, CAD, moderate mixed calcific atherosclerosis also noted.  Patient was given 500 cc normal saline, IV Protonix 40 mg, IV diltiazem 15 mg followed by continuous infusion, IV morphine 4 mg, 5 units subcutaneous NovoLog.  EDP spoke with on-call [facility], Dr. Marca Ancona, their team will see in consultation.  The hospitalist service was consulted to admit for further evaluation and management.  Review of Systems: All systems reviewed and are negative except as documented in history of present illness above.   Past Medical History:  Diagnosis Date   Abnormal Pap smear    Acute combined systolic and diastolic heart failure (HCC) 02/19/2019   Wt Readings from Last 3 Encounters:  09/18/21  84.7 kg  09/02/21  84.4 kg  08/31/21  84.2 kg         Acute heart failure (HCC) 01/29/2022   Acute on chronic combined systolic and diastolic CHF (congestive heart failure) (HCC) 02/19/2019   Acute on chronic congestive heart failure (HCC)    Acute on chronic congestive heart failure (HCC)    Acute on chronic diastolic CHF (congestive heart failure) (HCC) 10/19/2021   Acute on chronic systolic heart failure (HCC) 02/22/2022   Anemia  Asthma    Atrial fibrillation (HCC)    history of paroxysmal atrial fibrillation   CHF (congestive heart failure) (HCC) 11/02/2021   CHF exacerbation (HCC) 10/17/2021   Diabetes mellitus    DKA, type 2 (HCC) 02/06/2019   Elevated troponin 09/30/2019   Heart failure with reduced ejection fraction (HCC)    History of Left  ventricular thrombus 09/22/2021   Hydrosalpinx 08/21/2009   Dx on Pelvic US in Jan 2011.  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     Migraines    Morbid obesity (HCC) 02/06/2019   Nausea & vomiting 06/26/2022   Nonischemic cardiomyopathy (HCC) 09/22/2021   NSTEMI (non-ST elevated myocardial infarction) (HCC) 02/23/2022   Obstructive sleep apnea 06/30/2015   Polycystic ovarian disease     Past Surgical History:  Procedure Laterality Date   BIOPSY  11/05/2021   Procedure: BIOPSY;  Surgeon: Jenel Lucks, MD;  Location: [facility] ENDOSCOPY;  Service: Gastroenterology;;   CERVIX LESION DESTRUCTION     CESAREAN SECTION     ESOPHAGOGASTRODUODENOSCOPY (EGD) WITH PROPOFOL N/A 11/05/2021   Procedure: ESOPHAGOGASTRODUODENOSCOPY (EGD) WITH PROPOFOL;  Surgeon: Jenel Lucks, MD;  Location: [facility] ENDOSCOPY;  Service: Gastroenterology;  Laterality: N/A;   IR CT [name] LTD  09/24/2022   IR PERCUTANEOUS ART THROMBECTOMY/INFUSION INTRACRANIAL INC DIAG ANGIO  09/24/2022   RADIOLOGY WITH ANESTHESIA N/A 09/24/2022   Procedure: IR WITH ANESTHESIA;  Surgeon: Radiologist, Medication, MD;  Location: [facility] OR;  Service: Radiology;  Laterality: N/A;   RIGHT/LEFT HEART CATH AND CORONARY ANGIOGRAPHY N/A 03/29/2019   Procedure: RIGHT/LEFT HEART CATH AND CORONARY ANGIOGRAPHY;  Surgeon: Laurey Morale, MD;  Location: [facility] INVASIVE CV LAB;  Service: Cardiovascular;  Laterality: N/A;   RIGHT/LEFT HEART CATH AND CORONARY ANGIOGRAPHY N/A 09/20/2021   Procedure: RIGHT/LEFT HEART CATH AND CORONARY ANGIOGRAPHY;  Surgeon: Laurey Morale, MD;  Location: [facility] INVASIVE CV LAB;  Service: Cardiovascular;  Laterality: N/A;    Social History:  reports that she has never smoked. She has  never used smokeless tobacco. She reports that she does not drink alcohol and does not use drugs.  Allergies  Allergen Reactions   Fish Allergy Swelling   Shellfish Allergy Anaphylaxis, Swelling and Other (See Comments)    Airway involvement including EMS - Has Epi-Pen   Metformin And Related Diarrhea   Trulicity [Dulaglutide] Nausea And Vomiting and Other (See Comments)    Multiple episodes of vomiting over multiple days   Nsaids Other (See Comments)    Per PCP interferes with daily meds (heart failure)   Advil [Ibuprofen] Other (See Comments)    Per PCP interferes with daily meds (heart failure)    Family History  Problem Relation Age of Onset   Diabetes Mother    Hypertension Mother    Hypertension Father    Diabetes Father      Prior to Admission medications   Medication Sig Start Date End Date Taking? Authorizing Provider  Accu-Chek Softclix Lancets lancets Use up 4 (four) times daily as directed 06/06/22   [place], Alvira Philips, DO  acetaminophen (TYLENOL) 500 MG tablet Take 500-1,000 mg by mouth every 6 (six) hours as needed for mild pain or headache.    [provider]  amantadine (SYMMETREL) 100 MG capsule Take  2 capsules (200 mg total) by mouth daily. 11/01/22   Love, Evlyn Kanner, PA-C  apixaban (ELIQUIS) 5 MG TABS tablet Take 1 tablet (5 mg total) by mouth 2 (two) times daily. 10/31/22   Love, Evlyn Kanner, PA-C  atorvastatin (LIPITOR) 80 MG tablet Take 1 tablet (80 mg total) by mouth daily after supper. 10/31/22   Love, Evlyn Kanner, PA-C  blood glucose meter kit and supplies Use up to 4 (four) times daily as directed 06/06/22   [place], Alvira Philips, DO  Blood Glucose Monitoring Suppl DEVI 1 each by Does not apply route in the morning, at noon, and at bedtime. May substitute to any manufacturer covered by patient's insurance. 11/01/22   Love, Evlyn Kanner, PA-C  calcium carbonate (TUMS - DOSED IN MG ELEMENTAL CALCIUM) 500 MG chewable tablet Chew 1 tablet (200 mg of elemental calcium total) by  mouth daily after supper. 10/31/22   Love, Evlyn Kanner, PA-C  docusate sodium (COLACE) 100 MG capsule Take 1 capsule (100 mg total) by mouth daily. 11/01/22   Love, Evlyn Kanner, PA-C  empagliflozin (JARDIANCE) 25 MG TABS tablet Take 1 tablet (25 mg total) by mouth daily. 11/02/22   Love, Evlyn Kanner, PA-C  escitalopram (LEXAPRO) 10 MG tablet Take 1 tablet (10 mg total) by mouth at bedtime. 10/31/22   Love, Evlyn Kanner, PA-C  gabapentin (NEURONTIN) 100 MG capsule Take 1 capsule (100 mg total) by mouth at bedtime. 11/11/22   Levin Erp, MD  glipiZIDE (GLUCOTROL) 5 MG tablet Take 1 tablet (5 mg total) by mouth daily before breakfast. 11/01/22   Love, Evlyn Kanner, PA-C  Glucose Blood (BLOOD GLUCOSE TEST STRIPS) STRP Inject 1 each into the skin 4 (four) times daily -  before meals and at bedtime. May substitute to any manufacturer covered by patient's insurance. 11/01/22 12/01/22  Jacquelynn Cree, PA-C  glucose blood (ONETOUCH VERIO) test strip Use up to 4 (four) times daily as directed 06/06/22   [place], Alvira Philips, DO  hydrOXYzine (ATARAX) 10 MG tablet Take 1 tablet (10 mg total) by mouth 3 (three) times daily as needed. 10/31/22   Jacquelynn Cree, PA-C  Lancet Device MISC 1 each by Does not apply route in the morning, at noon, and at bedtime. May substitute to any manufacturer covered by patient's insurance. 11/01/22 12/01/22  Jacquelynn Cree, PA-C  Lancets Misc. MISC 1 each by Does not apply route in the morning, at noon, and at bedtime. May substitute to any manufacturer covered by patient's insurance. 11/01/22 12/01/22  Love, Evlyn Kanner, PA-C  losartan (COZAAR) 50 MG tablet Take 1 tablet (50 mg total) by mouth daily. 10/31/22   Love, Evlyn Kanner, PA-C  megestrol (MEGACE) 400 MG/10ML suspension Take 10 mLs (400 mg total) by mouth 2 (two) times daily. 10/31/22   Love, Evlyn Kanner, PA-C  melatonin 5 MG TABS Take 1 tablet (5 mg total) by mouth at bedtime. 10/31/22   Love, Evlyn Kanner, PA-C  Menthol-Methyl Salicylate (MUSCLE RUB) 10-15 % CREA Apply 1  Application topically 3 (three) times daily. 10/31/22   Love, Evlyn Kanner, PA-C  methocarbamol (ROBAXIN) 500 MG tablet Take 1 tablet (500 mg total) by mouth every 6 (six) hours as needed for muscle spasms. 10/31/22   Love, Evlyn Kanner, PA-C  metoprolol tartrate (LOPRESSOR) 25 MG tablet Take 1 tablet (25 mg total) by mouth 2 (two) times daily. 10/31/22   Love, Evlyn Kanner, PA-C  morphine (MSIR) 15 MG tablet Take 1 tablet (15 mg total) by mouth  every 8 (eight) hours as needed for severe pain. 10/31/22   Love, Evlyn Kanner, PA-C  Multiple Vitamin (MULTIVITAMIN WITH MINERALS) TABS tablet Take 1 tablet by mouth daily. 10/02/22   de Saintclair Halsted, Cortney E, NP  pantoprazole (PROTONIX) 40 MG tablet Take 1 tablet (40 mg total) by mouth at bedtime. 10/31/22   Love, Evlyn Kanner, PA-C  polyethylene glycol powder (GLYCOLAX/MIRALAX) 17 GM/SCOOP powder Take 1 capful (17 g) by mouth 2 (two) times daily. 10/31/22   Love, Evlyn Kanner, PA-C  senna-docusate (SENOKOT-S) 8.6-50 MG tablet Take 3 tablets by mouth daily with lunch. 10/31/22   Love, Evlyn Kanner, PA-C  spironolactone (ALDACTONE) 25 MG tablet Take 1 tablet (25 mg total) by mouth daily. 11/01/22   Love, Evlyn Kanner, PA-C  torsemide (DEMADEX) 20 MG tablet Take 1 tablet (20 mg total) by mouth daily. 11/01/22   Love, Evlyn Kanner, PA-C  traZODone (DESYREL) 50 MG tablet Take 0.5 tablets (25 mg total) by mouth at bedtime. 10/31/22   Love, Evlyn Kanner, PA-C  Vitamin D, Ergocalciferol, (DRISDOL) 1.25 MG (50000 UNIT) CAPS capsule Take 1 capsule (50,000 Units total) by mouth every 7 (seven) days. 10/31/22   Love, Evlyn Kanner, PA-C  carvedilol (COREG) 3.125 MG tablet Take 1 tablet (3.125 mg total) by mouth 2 (two) times daily with a meal. 02/27/22 07/01/22  Meredeth Ide, MD    Physical Exam: Vitals:   11/18/22 1215 11/18/22 1330 11/18/22 1425 11/18/22 1635  BP: (!) 139/104 115/84  130/80  Pulse: (!) 122 (!) 55  95  Resp: 10 16  14   Temp:   98.4 F (36.9 C) 98.1 F (36.7 C)  TempSrc:   Oral Oral  SpO2: 100% 100%  100%    Constitutional: Resting supine in bed, NAD, calm, comfortable Eyes: EOMI, lids and conjunctivae normal ENMT: Mucous membranes are moist. Posterior pharynx clear of any exudate or lesions.Normal dentition.  Neck: normal, supple, no masses. Respiratory: clear to auscultation bilaterally, no wheezing, no crackles. Normal respiratory effort. No accessory muscle use.  Cardiovascular: Sinus tachycardia, no murmurs / rubs / gallops. No extremity edema. 2+ pedal pulses. Abdomen: Mild epigastric tenderness, no masses palpated.  Musculoskeletal: no clubbing / cyanosis. No joint deformity upper and lower extremities. Good ROM, no contractures. Normal muscle tone.  Skin: no rashes, lesions, ulcers. No induration Neurologic: Sensation intact. Strength 5/5 RUE and RLE, 4/5 LUE and LLE Psychiatric: Alert and oriented x 3. Normal mood.   EKG: Personally reviewed. Sinus tachycardia, rate 117.  Previous EKG showed sinus rhythm, rate 80, PVCs.  Assessment/Plan Principal Problem:   Coffee ground emesis Active Problems:   Hyperlipidemia associated with type 2 diabetes mellitus (HCC)   LV (left ventricular) mural thrombus   History of right MCA stroke   Paroxysmal atrial fibrillation (HCC)   Chronic combined systolic and diastolic heart failure (HCC)   Hypertension associated with diabetes (HCC)   Hyperbilirubinemia   [name] is a 47 y.o. 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  Assessment and Plan: Upper GI bleed with coffee-ground emesis and melena: Patient on Eliquis due to history of A-fib and LV thrombus and recent stroke.  Presenting with several days coffee-ground emesis and melena.  Hemoglobin is stable at 14.4.  FOBT is positive. -[facility] to  consult -  Keep n.p.o. after midnight -Holding Eliquis for now -Continue IV Protonix 40 mg twice daily  Recent embolic right MCA stroke due to LV thrombus: S/p IR endovascular revascularization of occluded inferior division right MCA 09/24/2022.  She has residual left-sided weakness and has been using a wheelchair as an outpatient. -Eliquis on hold for now due to concern for GI bleed -Would like to resume anticoagulation as soon as safe from GI standpoint -Continue atorvastatin  Paroxysmal atrial fibrillation: He was placed on IV diltiazem drip while in the ED for reported A-fib with RVR however on review it appears rhythm was sinus tachycardia.  Rate is improved. -Discontinue IV diltiazem -Resume home Lopressor 25 mg twice daily -As above, holding Eliquis and would like to resume as soon as able from GI standpoint  Chronic combined systolic and diastolic CHF: Appears euvolemic.  EF is <15%.  Hold further IV fluids. -Continue Lopressor 25 mg twice daily -Resume torsemide 20 mg daily tomorrow -Holding Jardiance for now -Holding losartan and spironolactone while at risk for hypotension in setting of GI bleed; resume as warranted -Strict I/O's and daily weights  Leukocytosis: No clear infectious source at time of admission.  She has been afebrile.  UA negative for UTI.  Continue to monitor.  Hyperbilirubinemia: Total bilirubin persistently elevated, up to 3.5 compared to 2.6 on 4/21.  RUQ ultrasound at that time showed gallbladder sludge without evidence of cholelithiasis or acute cholecystitis.  CT this admission with unremarkable hepatobiliary and pancreatic findings.  Continue to monitor and consider repeat ultrasound if develops RUQ pain.  Type 2 diabetes: Holding home meds for now.  Placed on SSI.  Hypertension: Continue Lopressor and torsemide.  Holding losartan spironolactone as above.  Hyperlipidemia: Continue atorvastatin.   DVT prophylaxis: SCDs Start: 11/18/22 1936 Code  Status: Full code, confirmed with patient on admission Family Communication: Discussed with patient, she has discussed with family Disposition Plan: From home, dispo pending clinical progress Consults called: [facility] Severity of Illness: The appropriate patient status for this patient is INPATIENT. Inpatient status is judged to be reasonable and necessary in order to provide the required intensity of service to ensure the patient's safety. The patient's presenting symptoms, physical exam findings, and initial radiographic and laboratory data in the context of their chronic comorbidities is felt to place them at high risk for further clinical deterioration. Furthermore, it is not anticipated that the patient will be medically stable for discharge from the hospital within 2 midnights of admission.   * I certify that at the point of admission it is my clinical judgment that the patient will require inpatient hospital care spanning beyond 2 midnights from the point of admission due to high intensity of service, high risk for further deterioration and high frequency of surveillance required.Darreld Mclean MD [facility]  If 7PM-7AM, please contact night-coverage [email or web address]  11/18/2022, 8:28 PM

## 2022-11-19 ENCOUNTER — Other Ambulatory Visit: Payer: Self-pay

## 2022-11-19 ENCOUNTER — Encounter (HOSPITAL_COMMUNITY): Payer: Self-pay | Admitting: Internal Medicine

## 2022-11-19 DIAGNOSIS — I480 Paroxysmal atrial fibrillation: Secondary | ICD-10-CM

## 2022-11-19 DIAGNOSIS — E1169 Type 2 diabetes mellitus with other specified complication: Secondary | ICD-10-CM

## 2022-11-19 DIAGNOSIS — Z8673 Personal history of transient ischemic attack (TIA), and cerebral infarction without residual deficits: Secondary | ICD-10-CM

## 2022-11-19 DIAGNOSIS — I513 Intracardiac thrombosis, not elsewhere classified: Secondary | ICD-10-CM

## 2022-11-19 DIAGNOSIS — E785 Hyperlipidemia, unspecified: Secondary | ICD-10-CM

## 2022-11-19 DIAGNOSIS — K920 Hematemesis: Secondary | ICD-10-CM | POA: Diagnosis not present

## 2022-11-19 LAB — CBC
HCT: 41.8 % (ref 36.0–46.0)
Hemoglobin: 13 g/dL (ref 12.0–15.0)
MCH: 22.4 pg — ABNORMAL LOW (ref 26.0–34.0)
MCHC: 31.1 g/dL (ref 30.0–36.0)
MCV: 71.9 fL — ABNORMAL LOW (ref 80.0–100.0)
Platelets: 281 10*3/uL (ref 150–400)
RBC: 5.81 MIL/uL — ABNORMAL HIGH (ref 3.87–5.11)
RDW: 21.9 % — ABNORMAL HIGH (ref 11.5–15.5)
WBC: 7.2 10*3/uL (ref 4.0–10.5)
nRBC: 0 % (ref 0.0–0.2)

## 2022-11-19 LAB — GLUCOSE, CAPILLARY
Glucose-Capillary: 201 mg/dL — ABNORMAL HIGH (ref 70–99)
Glucose-Capillary: 160 mg/dL — ABNORMAL HIGH (ref 70–99)
Glucose-Capillary: 231 mg/dL — ABNORMAL HIGH (ref 70–99)
Glucose-Capillary: 147 mg/dL — ABNORMAL HIGH (ref 70–99)

## 2022-11-19 LAB — COMPREHENSIVE METABOLIC PANEL
ALT: 19 U/L (ref 0–44)
AST: 30 U/L (ref 15–41)
Albumin: 3 g/dL — ABNORMAL LOW (ref 3.5–5.0)
Alkaline Phosphatase: 144 U/L — ABNORMAL HIGH (ref 38–126)
Anion gap: 13 (ref 5–15)
BUN: 21 mg/dL — ABNORMAL HIGH (ref 6–20)
CO2: 28 mmol/L (ref 22–32)
Calcium: 9.4 mg/dL (ref 8.9–10.3)
Chloride: 92 mmol/L — ABNORMAL LOW (ref 98–111)
Creatinine, Ser: 1.07 mg/dL — ABNORMAL HIGH (ref 0.44–1.00)
GFR, Estimated: >60 mL/min (ref >60)
Glucose, Bld: 219 mg/dL — ABNORMAL HIGH (ref 70–99)
Potassium: 3 mmol/L — ABNORMAL LOW (ref 3.5–5.1)
Sodium: 133 mmol/L — ABNORMAL LOW (ref 135–145)
Total Bilirubin: 2.5 mg/dL — ABNORMAL HIGH (ref 0.3–1.2)
Total Protein: 7.9 g/dL (ref 6.5–8.1)

## 2022-11-19 LAB — HIV ANTIBODY (ROUTINE TESTING W REFLEX): HIV Screen 4th Generation wRfx: NONREACTIVE

## 2022-11-19 LAB — MRSA NEXT GEN BY PCR, NASAL: MRSA by PCR Next Gen: NOT DETECTED

## 2022-11-19 MED ORDER — HYDROMORPHONE HCL 1 MG/ML IJ SOLN
0.5000 mg | Freq: Once | INTRAMUSCULAR | Status: AC
  Administered 2022-11-19: 0.5 mg via INTRAVENOUS
  Filled 2022-11-19: qty 1

## 2022-11-19 MED ORDER — APIXABAN 5 MG PO TABS
5.0000 mg | ORAL_TABLET | Freq: Two times a day (BID) | ORAL | Status: DC
  Administered 2022-11-19 – 2022-11-22 (×6): 5 mg via ORAL
  Filled 2022-11-19 (×6): qty 1

## 2022-11-19 MED ORDER — HYDROCORTISONE ACETATE 25 MG RE SUPP
25.0000 mg | Freq: Two times a day (BID) | RECTAL | Status: DC
  Administered 2022-11-19 – 2022-11-21 (×5): 25 mg via RECTAL
  Filled 2022-11-19 (×7): qty 1

## 2022-11-19 NOTE — Consult Note (Addendum)
Referring Provider: TH Primary Care Physician:  Reece Leader, DO Primary Gastroenterologist: Gentry Fitz  Reason for Consultation: Coffee-ground emesis  HPI: [name] is a 47 y.o. 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  Patient was admitted to the hospital from February 29 to March 9 with right MCA stroke from large LV thrombus embolic events.  He underwent IR guided revascularization.  Coumadin was changed to Eliquis.  Patient had a cardiac arrest during IR procedure requiring CODE BLUE and resuscitation.  Patient seen and examined at bedside.  Not able to obtain any history from the patient as she is lethargic.  Recently received Dilaudid.  Discussed with RN at bedside.  Chart reviewed.  Looks like patient started having nausea and vomiting few days ago followed by coffee-ground colored vomiting.  Also seen some dark-colored stools and streaks of fresh blood in the stool.  Blood work on admission yesterday showed hemoglobin of 14.4 which is normal.  Her blood negative x 1 but positive x 1 as well.  Gastroccult negative.  Hemoglobin remains normal this morning.  Mild drop this morning could be dilutional as all 3 cell lines have decreased.  Mild elevated LFTs, which is also chronic since 2020 could be from ischemic cardiomyopathy related congestive hepatopathy.  EGD in April 2023 by Dr. Tomasa Rand was normal.  Biopsies were negative for H. pylori.  Past Medical History:  Diagnosis Date   Abnormal Pap smear    Acute combined systolic and diastolic heart failure (HCC) 02/19/2019   Wt Readings from Last 3 Encounters:  09/18/21  84.7 kg  09/02/21  84.4 kg  08/31/21  84.2 kg         Acute heart failure (HCC) 01/29/2022   Acute on  chronic combined systolic and diastolic CHF (congestive heart failure) (HCC) 02/19/2019   Acute on chronic congestive heart failure (HCC)    Acute on chronic congestive heart failure (HCC)    Acute on chronic diastolic CHF (congestive heart failure) (HCC) 10/19/2021   Acute on chronic systolic heart failure (HCC) 02/22/2022   Anemia    Asthma    Atrial fibrillation (HCC)    history of paroxysmal atrial fibrillation   CHF (congestive heart failure) (HCC) 11/02/2021   CHF exacerbation (HCC) 10/17/2021   Diabetes mellitus    DKA, type 2 (HCC) 02/06/2019   Elevated troponin 09/30/2019   Heart failure with reduced ejection fraction (HCC)    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     Migraines    Morbid obesity (HCC) 02/06/2019   Nausea & vomiting 06/26/2022   Nonischemic cardiomyopathy (HCC) 09/22/2021   NSTEMI (non-ST elevated myocardial infarction) (HCC) 02/23/2022   Obstructive sleep apnea 06/30/2015   Polycystic ovarian disease     Past Surgical History:  Procedure Laterality Date   BIOPSY  11/05/2021   Procedure: BIOPSY;  Surgeon: Jenel Lucks, MD;  Location: [facility] ENDOSCOPY;  Service: Gastroenterology;;   CERVIX  LESION DESTRUCTION     CESAREAN SECTION     ESOPHAGOGASTRODUODENOSCOPY (EGD) WITH PROPOFOL N/A 11/05/2021   Procedure: ESOPHAGOGASTRODUODENOSCOPY (EGD) WITH PROPOFOL;  Surgeon: Jenel Lucks, MD;  Location: [facility] ENDOSCOPY;  Service: Gastroenterology;  Laterality: N/A;   IR CT HEAD LTD  09/24/2022   IR PERCUTANEOUS ART THROMBECTOMY/INFUSION INTRACRANIAL INC DIAG ANGIO  09/24/2022   RADIOLOGY WITH ANESTHESIA  N/A 09/24/2022   Procedure: IR WITH ANESTHESIA;  Surgeon: Radiologist, Medication, MD;  Location: [facility] OR;  Service: Radiology;  Laterality: N/A;   RIGHT/LEFT HEART CATH AND CORONARY ANGIOGRAPHY N/A 03/29/2019   Procedure: RIGHT/LEFT HEART CATH AND CORONARY ANGIOGRAPHY;  Surgeon: Laurey Morale, MD;  Location: [facility] INVASIVE CV LAB;  Service: Cardiovascular;  Laterality: N/A;   RIGHT/LEFT HEART CATH AND CORONARY ANGIOGRAPHY N/A 09/20/2021   Procedure: RIGHT/LEFT HEART CATH AND CORONARY ANGIOGRAPHY;  Surgeon: Laurey Morale, MD;  Location: [facility] INVASIVE CV LAB;  Service: Cardiovascular;  Laterality: N/A;    Prior to Admission medications   Medication Sig Start Date End Date Taking? Authorizing Provider  Accu-Chek Softclix Lancets lancets Use up 4 (four) times daily as directed 06/06/22   [place], Alvira Philips, DO  acetaminophen (TYLENOL) 500 MG tablet Take 500-1,000 mg by mouth every 6 (six) hours as needed for mild pain or headache.    [provider]  amantadine (SYMMETREL) 100 MG capsule Take 2 capsules (200 mg total) by mouth daily. 11/01/22   Love, Evlyn Kanner, PA-C  apixaban (ELIQUIS) 5 MG TABS tablet Take 1 tablet (5 mg total) by mouth 2 (two) times daily. 10/31/22   Love, Evlyn Kanner, PA-C  atorvastatin (LIPITOR) 80 MG tablet Take 1 tablet (80 mg total) by mouth daily after supper. 10/31/22   Love, Evlyn Kanner, PA-C  blood glucose meter kit and supplies Use up to 4 (four) times daily as directed 06/06/22   [place], Alvira Philips, DO  Blood Glucose Monitoring Suppl DEVI 1 each by Does not apply route in the morning, at noon, and at bedtime. May substitute to any manufacturer covered by patient's insurance. 11/01/22   Love, Evlyn Kanner, PA-C  calcium carbonate (TUMS - DOSED IN MG ELEMENTAL CALCIUM) 500 MG chewable tablet Chew 1 tablet (200 mg of elemental calcium total) by mouth daily after supper. 10/31/22   Love, Evlyn Kanner, PA-C  docusate sodium (COLACE) 100 MG capsule Take 1 capsule (100 mg total) by mouth daily. 11/01/22    Love, Evlyn Kanner, PA-C  empagliflozin (JARDIANCE) 25 MG TABS tablet Take 1 tablet (25 mg total) by mouth daily. 11/02/22   Love, Evlyn Kanner, PA-C  escitalopram (LEXAPRO) 10 MG tablet Take 1 tablet (10 mg total) by mouth at bedtime. 10/31/22   Love, Evlyn Kanner, PA-C  gabapentin (NEURONTIN) 100 MG capsule Take 1 capsule (100 mg total) by mouth at bedtime. 11/11/22   Levin Erp, MD  glipiZIDE (GLUCOTROL) 5 MG tablet Take 1 tablet (5 mg total) by mouth daily before breakfast. 11/01/22   Love, Evlyn Kanner, PA-C  Glucose Blood (BLOOD GLUCOSE TEST STRIPS) STRP Inject 1 each into the skin 4 (four) times daily -  before meals and at bedtime. May substitute to any manufacturer covered by patient's insurance. 11/01/22 12/01/22  Jacquelynn Cree, PA-C  glucose blood (ONETOUCH VERIO) test strip Use up to 4 (four) times daily as directed 06/06/22   [place], Alvira Philips, DO  hydrOXYzine (ATARAX) 10 MG tablet Take 1 tablet (10 mg total) by mouth 3 (three) times daily as needed. 10/31/22  Jacquelynn Cree, PA-C  Lancet Device MISC 1 each by Does not apply route in the morning, at noon, and at bedtime. May substitute to any manufacturer covered by patient's insurance. 11/01/22 12/01/22  Jacquelynn Cree, PA-C  Lancets Misc. MISC 1 each by Does not apply route in the morning, at noon, and at bedtime. May substitute to any manufacturer covered by patient's insurance. 11/01/22 12/01/22  Love, Evlyn Kanner, PA-C  losartan (COZAAR) 50 MG tablet Take 1 tablet (50 mg total) by mouth daily. 10/31/22   Love, Evlyn Kanner, PA-C  megestrol (MEGACE) 400 MG/10ML suspension Take 10 mLs (400 mg total) by mouth 2 (two) times daily. 10/31/22   Love, Evlyn Kanner, PA-C  melatonin 5 MG TABS Take 1 tablet (5 mg total) by mouth at bedtime. 10/31/22   Love, Evlyn Kanner, PA-C  Menthol-Methyl Salicylate (MUSCLE RUB) 10-15 % CREA Apply 1 Application topically 3 (three) times daily. 10/31/22   Love, Evlyn Kanner, PA-C  methocarbamol (ROBAXIN) 500 MG tablet Take 1 tablet (500 mg total) by mouth every 6  (six) hours as needed for muscle spasms. 10/31/22   Love, Evlyn Kanner, PA-C  metoprolol tartrate (LOPRESSOR) 25 MG tablet Take 1 tablet (25 mg total) by mouth 2 (two) times daily. 10/31/22   Love, Evlyn Kanner, PA-C  morphine (MSIR) 15 MG tablet Take 1 tablet (15 mg total) by mouth every 8 (eight) hours as needed for severe pain. 10/31/22   Love, Evlyn Kanner, PA-C  Multiple Vitamin (MULTIVITAMIN WITH MINERALS) TABS tablet Take 1 tablet by mouth daily. 10/02/22   de [place]clair Halsted, Cortney E, NP  pantoprazole (PROTONIX) 40 MG tablet Take 1 tablet (40 mg total) by mouth at bedtime. 10/31/22   Love, Evlyn Kanner, PA-C  polyethylene glycol powder (GLYCOLAX/MIRALAX) 17 GM/SCOOP powder Take 1 capful (17 g) by mouth 2 (two) times daily. 10/31/22   Love, Evlyn Kanner, PA-C  senna-docusate (SENOKOT-S) 8.6-50 MG tablet Take 3 tablets by mouth daily with lunch. 10/31/22   Love, Evlyn Kanner, PA-C  spironolactone (ALDACTONE) 25 MG tablet Take 1 tablet (25 mg total) by mouth daily. 11/01/22   Love, Evlyn Kanner, PA-C  torsemide (DEMADEX) 20 MG tablet Take 1 tablet (20 mg total) by mouth daily. 11/01/22   Love, Evlyn Kanner, PA-C  traZODone (DESYREL) 50 MG tablet Take 0.5 tablets (25 mg total) by mouth at bedtime. 10/31/22   Love, Evlyn Kanner, PA-C  Vitamin D, Ergocalciferol, (DRISDOL) 1.25 MG (50000 UNIT) CAPS capsule Take 1 capsule (50,000 Units total) by mouth every 7 (seven) days. 10/31/22   Love, Evlyn Kanner, PA-C  carvedilol (COREG) 3.125 MG tablet Take 1 tablet (3.125 mg total) by mouth 2 (two) times daily with a meal. 02/27/22 07/01/22  Meredeth Ide, MD    Scheduled Meds:  atorvastatin  80 mg Oral QPC supper   Chlorhexidine Gluconate Cloth  6 each Topical Daily   escitalopram  10 mg Oral QHS   gabapentin  100 mg Oral QHS   insulin aspart  0-5 Units Subcutaneous QHS   insulin aspart  0-9 Units Subcutaneous TID WC   metoprolol tartrate  25 mg Oral BID   pantoprazole (PROTONIX) IV  40 mg Intravenous Q12H   torsemide  20 mg Oral Daily   Continuous Infusions: PRN  Meds:.acetaminophen **OR** acetaminophen, hydrOXYzine, ondansetron **OR** ondansetron (ZOFRAN) IV, mouth rinse, senna-docusate  Allergies as of 11/18/2022 - Review Complete 11/18/2022  Allergen Reaction Noted   Fish allergy Swelling 06/03/2022   Shellfish allergy Anaphylaxis, Swelling, and  Other (See Comments) 07/22/2013   Metformin and related Diarrhea 06/29/2015   Trulicity [dulaglutide] Nausea And Vomiting and Other (See Comments) 09/06/2019   Nsaids Other (See Comments) 05/06/2022   Advil [ibuprofen] Other (See Comments) 12/20/2019    Family History  Problem Relation Age of Onset   Diabetes Mother    Hypertension Mother    Hypertension Father    Diabetes Father     Social History   Socioeconomic History   Marital status: Single    Spouse name: Not on file   Number of children: Not on file   Years of education: Not on file   Highest education level: Not on file  Occupational History   Not on file  Tobacco Use   Smoking status: Never   Smokeless tobacco: Never   Tobacco comments:    Never smoke 10/22/21  Vaping Use   Vaping Use: Never used  Substance and Sexual Activity   Alcohol use: No    Alcohol/week: 0.0 standard drinks of alcohol   Drug use: No   Sexual activity: Yes  Other Topics Concern   Not on file  Social History Narrative   Not on file   Social Determinants of Health   Financial Resource Strain: High Risk (10/17/2019)   Overall Financial Resource Strain (CARDIA)    Difficulty of Paying Living Expenses: Hard  Food Insecurity: No Food Insecurity (11/19/2022)   Hunger Vital Sign    Worried About Running Out of Food in the Last Year: Never true    Ran Out of Food in the Last Year: Never true  Transportation Needs: No Transportation Needs (11/19/2022)   PRAPARE - Administrator, Civil Service (Medical): No    Lack of Transportation (Non-Medical): No  Physical Activity: Insufficiently Active (08/19/2020)   Exercise Vital Sign    Days of  Exercise per Week: 7 days    Minutes of Exercise per Session: 20 min  Stress: Stress Concern Present (08/19/2020)   [employer] of Occupational Health - Occupational Stress Questionnaire    Feeling of Stress : Very much  Social Connections: Not on file  Intimate Partner Violence: Not At Risk (11/19/2022)   Humiliation, Afraid, Rape, and Kick questionnaire    Fear of Current or Ex-Partner: No    Emotionally Abused: No    Physically Abused: No    Sexually Abused: No    Review of Systems: Not able to obtain  Physical Exam: Vital signs: Vitals:   11/19/22 0750 11/19/22 0800  BP:  (!) 141/105  Pulse:  86  Resp:  12  Temp: 98.6 F (37 C)   SpO2:  99%   Last BM Date :  (PTA) General:   Lethargic and sleepy, not able to provide any history Lungs: No visible respiratory distress Heart:  Regular rate and rhythm; no murmurs, clicks, rubs,  or gallops. Abdomen: Soft, minimally distended, bowel sounds present, no peritoneal signs Not able to perform psychological or neurological evaluation Rectal:  Deferred  GI:  Lab Results: Recent Labs    11/18/22 1119 11/19/22 0717  WBC 14.0* 7.2  HGB 14.4 13.0  HCT 46.0 41.8  PLT 303 281   BMET Recent Labs    11/18/22 1119 11/19/22 0717  NA 134* 133*  K 4.5 3.0*  CL 92* 92*  CO2 24 28  GLUCOSE 296* 219*  BUN 19 21*  CREATININE 0.89 1.07*  CALCIUM 9.6 9.4   LFT Recent Labs    11/19/22 0717  PROT 7.9  ALBUMIN 3.0*  AST 30  ALT 19  ALKPHOS 144*  BILITOT 2.5*   PT/INR No results for input(s): "LABPROT", "INR" in the last 72 hours.   Studies/Results: CT ANGIO GI BLEED  Result Date: 11/18/2022 CLINICAL DATA:  Coffee-ground emesis, lower GI bleed EXAM: CTA ABDOMEN AND PELVIS WITHOUT AND WITH CONTRAST TECHNIQUE: Multidetector CT imaging of the abdomen and pelvis was performed using the standard protocol during bolus administration of intravenous contrast. Multiplanar reconstructed images and MIPs were obtained and  reviewed to evaluate the vascular anatomy. RADIATION DOSE REDUCTION: This exam was performed according to the departmental dose-optimization program which includes automated exposure control, adjustment of the mA and/or kV according to patient size and/or use of iterative reconstruction technique. CONTRAST:  OMNIPAQUE IOHEXOL 350 MG/ML SOLN COMPARISON:  None Available. FINDINGS: VASCULAR Normal contour and caliber of the abdominal aorta. No evidence of aneurysm, dissection, or other acute aortic pathology. Standard branching pattern of the abdominal aorta with solitary bilateral renal arteries. Moderate mixed calcific atherosclerosis. Review of the MIP images confirms the above findings. NON-VASCULAR Lower chest: Cardiomegaly.  Coronary artery calcifications. Hepatobiliary: No solid liver abnormality is seen. No gallstones, gallbladder wall thickening, or biliary dilatation. Pancreas: Unremarkable. No pancreatic ductal dilatation or surrounding inflammatory changes. Spleen: Normal in size without significant abnormality. Adrenals/Urinary Tract: Adrenal glands are unremarkable simple, benign bilateral renal cortical cysts, for which no further follow-up or characterization is required. Kidneys are otherwise normal, without renal calculi, solid lesion, or hydronephrosis. Bladder is unremarkable. Stomach/Bowel: Stomach is within normal limits. Appendix appears normal. No evidence of bowel wall thickening, distention, or inflammatory changes. Lymphatic: No enlarged abdominal or pelvic lymph nodes. Reproductive: No mass or other significant abnormality. Other: No abdominal wall hernia or abnormality. No ascites. Musculoskeletal: No acute osseous findings. IMPRESSION: 1. No intraluminal contrast extravasation or other findings to localize lower GI bleeding. 2. Normal contour and caliber of the abdominal aorta. No evidence of aneurysm, dissection, or other acute aortic pathology. 3. Moderate mixed calcific  atherosclerosis. 4. Cardiomegaly and coronary artery disease. Electronically Signed   By: Jearld Lesch M.D.   On: 11/18/2022 18:00    Impression/Plan: -Coffee-ground emesis in setting of nausea and vomiting.  Hemoglobin normal.  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  EGD in April 2023 was normal. -H/O  rectal bleeding.  Streaks of fresh blood in the stool according to chart review.  Unable to obtain history from patient. -Nonischemic cardiomyopathy with severe CHF.  EF of < 15%. -History of cardiac arrest in March 2024 while undergoing IR guided revascularization -History of recent right embolic MCA stroke -History of LV thrombus and paroxysmal atrial fibrillation.  Was on Eliquis. -Abnormal LFTs.  Chronic since 2020.  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  Recommendations -------------------------- -Patient's hemoglobin is stable.  No further bleeding according to RN.  Given her recent embolic events, okay to resume Eliquis from GI standpoint.  Patient with history of cardiac arrest last month.  No plan for any endoscopic procedure at this time.  Recommend to keep her on IV twice daily PPI while in the hospital and discharged her on Protonix 40 mg once a day for 8 weeks. -Recommend Anusol suppositories for possible hemorrhoids. -No further inpatient GI workup planned.  GI will sign off.  Call us back if needed.  Patient with complex medical decision making with multiple comorbidities and need of anticoagulation in setting of GI bleed.    LOS: 1 day   Kathi Der  MD,  FACP 11/19/2022, 9:27 AM  Contact #  [phone]

## 2022-11-19 NOTE — Progress Notes (Signed)
[facility]  PROGRESS NOTE  [name] WGN:[id number] DOB: May 31, 1976 DOA: 11/18/2022 PCP: Reece Leader, DO   Brief HPI:   47 year old female with medical history of chronic combined systolic and diastolic CHF, EF 78%, paroxysmal atrial fibrillation on Eliquis, NICM, recent right MCA stroke thought to be embolic due to LV thrombus, resolved left-sided weakness, diabetes mellitus type 2, hypertension, hyperlipidemia came to ED for evaluation of coffee-ground emesis. Patient recently admitted 09/22/2022-10/01/2022 for right MCA stroke thought to be embolic from large LV thrombus in the setting of subtherapeutic Coumadin prior to admission.  She underwent IR endovascular revascularization.  Anticoagulation was changed to Eliquis.  She had brief cardiac arrest during IR procedure requiring 2 minutes of ACLS before ROSC achieved.  She was discharged to inpatient rehab and subsequently discharged to home from rehab 11/01/2022.   Patient states for last few days she has been having frequent nausea vomiting.  Emesis was reported as coffee-ground appearance.  Also noted some dark red blood in the stool.  Patient has been taking Eliquis she had EGD performed on 11/05/2021 2000 normal. In the ED FOBT was positive. CTA GI bleed study was negative for intraluminal contrast extravasation or other finding to localize lower GI bleed.  Normal contour and caliber of abdominal aorta noted.  No evidence of aneurysm dissection or other aortic pathology. [facility] gastroenterology was consulted.   Subjective   Patient seen, complains of epigastric pain.   Assessment/Plan:    Upper GI bleed with coffee-ground emesis/melena -Patient has been taking Eliquis due to history of A-fib and LV thrombus and recent stroke -Presented with several days of coffee-ground emesis and melena -FOBT positive; hemoglobin stable at 13.0 -.  Gastroenterology has seen the patient.  No plan for any intervention.  She has history of  cardiac arrest last month.  Recommend to continue with IV PPI twice daily in the hospital and discharged on Protonix 40 mg p.o. daily for 8 weeks. Recommend to restart Eliquis -Recommend Anusol suppository for possible hemorrhoids.   Recent embolic right MCA stroke due to LV thrombus -S/p IR endovascular revascularization of occluded inferior division of right MCA on 09/24/2022 -Has residual left-sided weakness, has been using wheelchair as outpatient -Eliquis on hold due to concern for GI bleed -Would resume anticoagulation  -Continue atorvastatin  Paroxysmal atrial fibrillation -She was placed on IV diltiazem gtt. while in the ED for reported A-fib with RVR -Converted to normal sinus rhythm, IV diltiazem was discontinued -Lopressor 25 mg p.o. twice daily resumed which is patient's home dose -Eliquis will be restarted as above  Hyperbilirubinemia -Total bilirubin persistently elevated up to 3.5 -Right upper quadrant ultrasound on 4/21 showed gallbladder sludge without evidence of cholelithiasis or acute cholecystitis -CT abdomen/pelvis unremarkable for biliary hepatobiliary cause -Consider repeating right upper quadrant ultrasound if patient develops symptoms  Diabetes mellitus type 2 -Home medications on hold -Continue sliding scale insulin NovoLog  Hypertension -Continue Lopressor, torsemide -Losartan and Aldactone on hold  Hyperlipidemia -Continue atorvastatin    Medications     atorvastatin  80 mg Oral QPC supper   Chlorhexidine Gluconate Cloth  6 each Topical Daily   escitalopram  10 mg Oral QHS   gabapentin  100 mg Oral QHS   insulin aspart  0-5 Units Subcutaneous QHS   insulin aspart  0-9 Units Subcutaneous TID WC   metoprolol tartrate  25 mg Oral BID   pantoprazole (PROTONIX) IV  40 mg Intravenous Q12H   torsemide  20 mg Oral Daily  Data Reviewed:   CBG:  Recent Labs  Lab 11/18/22 2155 11/19/22 0741  GLUCAP 237* 201*    SpO2: 95 %    Vitals:    11/19/22 0300 11/19/22 0400 11/19/22 0500 11/19/22 0600  BP: 98/76 105/80 114/85 121/85  Pulse: 66 67 70 75  Resp: 11 11 15 11   Temp:  98.3 F (36.8 C)    TempSrc:  Oral    SpO2: 100% 97% 100% 95%  Weight:      Height:          Data Reviewed:  Basic Metabolic Panel: Recent Labs  Lab 11/13/22 1553 11/18/22 1119  NA 133* 134*  K 3.1* 4.5  CL 92* 92*  CO2 26 24  GLUCOSE 171* 296*  BUN 15 19  CREATININE 1.16* 0.89  CALCIUM 10.0 9.6    CBC: Recent Labs  Lab 11/13/22 1553 11/18/22 1119 11/19/22 0717  WBC 9.5 14.0* 7.2  NEUTROABS 6.5 11.4*  --   HGB 15.1* 14.4 13.0  HCT 47.4* 46.0 41.8  MCV 70.0* 71.1* 71.9*  PLT 285 303 281    LFT Recent Labs  Lab 11/13/22 1553 11/18/22 1119  AST 47* 38  ALT 35 23  ALKPHOS 228* 172*  BILITOT 2.6* 3.5*  PROT 8.8* 8.7*  ALBUMIN 3.3* 3.4*     Antibiotics: Anti-infectives (From admission, onward)    None        DVT prophylaxis: SCDs  Code Status: Full code  Family Communication:    CONSULTS gastroenterology   Objective    Physical Examination:  General-appears in no acute distress Heart-S1-S2, regular, no murmur auscultated Lungs-clear to auscultation bilaterally, no wheezing or crackles auscultated Abdomen-soft, mild tenderness in epigastric region , no organomegaly Extremities-no edema in the lower extremities Neuro-alert, oriented x3, no focal deficit noted  Status is: Inpatient:             Meredeth Ide   [facility] If 7PM-7AM, please contact night-coverage at [email or web address], Office  [phone]   11/19/2022, 8:00 AM  LOS: 1 day

## 2022-11-20 DIAGNOSIS — I513 Intracardiac thrombosis, not elsewhere classified: Secondary | ICD-10-CM | POA: Diagnosis not present

## 2022-11-20 DIAGNOSIS — K920 Hematemesis: Secondary | ICD-10-CM | POA: Diagnosis not present

## 2022-11-20 DIAGNOSIS — Z8673 Personal history of transient ischemic attack (TIA), and cerebral infarction without residual deficits: Secondary | ICD-10-CM | POA: Diagnosis not present

## 2022-11-20 DIAGNOSIS — E1169 Type 2 diabetes mellitus with other specified complication: Secondary | ICD-10-CM | POA: Diagnosis not present

## 2022-11-20 LAB — CBC
HCT: 40.5 % (ref 36.0–46.0)
Hemoglobin: 12.5 g/dL (ref 12.0–15.0)
MCH: 22.3 pg — ABNORMAL LOW (ref 26.0–34.0)
MCHC: 30.9 g/dL (ref 30.0–36.0)
MCV: 72.2 fL — ABNORMAL LOW (ref 80.0–100.0)
Platelets: 262 K/uL (ref 150–400)
RBC: 5.61 MIL/uL — ABNORMAL HIGH (ref 3.87–5.11)
RDW: 21.4 % — ABNORMAL HIGH (ref 11.5–15.5)
WBC: 8.2 K/uL (ref 4.0–10.5)
nRBC: 0 % (ref 0.0–0.2)

## 2022-11-20 LAB — GLUCOSE, CAPILLARY
Glucose-Capillary: 175 mg/dL — ABNORMAL HIGH (ref 70–99)
Glucose-Capillary: 247 mg/dL — ABNORMAL HIGH (ref 70–99)
Glucose-Capillary: 202 mg/dL — ABNORMAL HIGH (ref 70–99)
Glucose-Capillary: 227 mg/dL — ABNORMAL HIGH (ref 70–99)

## 2022-11-20 LAB — BASIC METABOLIC PANEL
Anion gap: 14 (ref 5–15)
BUN: 22 mg/dL — ABNORMAL HIGH (ref 6–20)
CO2: 28 mmol/L (ref 22–32)
Calcium: 8.8 mg/dL — ABNORMAL LOW (ref 8.9–10.3)
Chloride: 90 mmol/L — ABNORMAL LOW (ref 98–111)
Creatinine, Ser: 0.98 mg/dL (ref 0.44–1.00)
GFR, Estimated: >60 mL/min (ref >60)
Glucose, Bld: 204 mg/dL — ABNORMAL HIGH (ref 70–99)
Potassium: 2.9 mmol/L — ABNORMAL LOW (ref 3.5–5.1)
Sodium: 132 mmol/L — ABNORMAL LOW (ref 135–145)

## 2022-11-20 MED ORDER — POTASSIUM CHLORIDE 10 MEQ/100ML IV SOLN
10.0000 meq | INTRAVENOUS | Status: AC
  Administered 2022-11-20 (×3): 10 meq via INTRAVENOUS
  Filled 2022-11-20 (×3): qty 100

## 2022-11-20 MED ORDER — METHOCARBAMOL 500 MG PO TABS
500.0000 mg | ORAL_TABLET | Freq: Four times a day (QID) | ORAL | Status: DC | PRN
  Administered 2022-11-20 – 2022-11-21 (×2): 500 mg via ORAL
  Filled 2022-11-20 (×2): qty 1

## 2022-11-20 MED ORDER — OXYCODONE HCL 5 MG PO TABS
5.0000 mg | ORAL_TABLET | Freq: Four times a day (QID) | ORAL | Status: DC | PRN
  Administered 2022-11-20: 5 mg via ORAL
  Filled 2022-11-20: qty 1

## 2022-11-20 NOTE — TOC CM/SW Note (Addendum)
  Transition of Care [facility]) Screening Note   Patient Details  Name: [name] Date of Birth: 10/11/1975   [facility]) CM/SW Contact:    Amada Jupiter, LCSW Phone Number: 11/20/2022, 12:40 PM    Transition of Care Department [facility]) has reviewed patient and no TOC needs have been identified at this time. We will continue to monitor patient advancement through interdisciplinary progression rounds. If new patient transition needs arise, please place a TOC consult.  Per chart review of pt's recent discharge from CIR, pt already has hospital bed, wheelchair, bedside commode and tub seat in the home.  Frances Furbish was open for [facility] services.  Family providing needed assistance.

## 2022-11-20 NOTE — Progress Notes (Addendum)
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 Patient recently admitted 09/22/2022-10/01/2022 for right MCA stroke thought to be embolic from large LV thrombus in the setting of subtherapeutic Coumadin prior to admission.  She underwent IR endovascular revascularization.  Anticoagulation was changed to Eliquis.  She had brief cardiac arrest during IR procedure requiring 2 minutes of ACLS before ROSC achieved.  She was discharged to inpatient rehab and subsequently discharged to home from rehab 11/01/2022.   Patient states for last few days she has been having frequent nausea vomiting.  Emesis was reported as coffee-ground appearance.  Also noted some dark red blood in the stool.  Patient has been taking Eliquis she had EGD performed on 11/05/2021 2000 normal. In the ED FOBT was positive. CTA GI bleed study was negative for intraluminal contrast extravasation or other finding to localize lower GI bleed.  Normal contour and caliber of abdominal aorta noted.  No evidence of aneurysm dissection or other aortic pathology. [facility] gastroenterology was consulted.   Subjective   Patient seen and examined, denies any complaints.  Hemoglobin has been stable.  No further episodes of coffee-ground emesis.   Assessment/Plan:    Upper GI bleed with coffee-ground emesis/melena -Patient has been taking Eliquis due to history of A-fib and LV thrombus and recent stroke -Presented with several days of coffee-ground emesis and melena -FOBT positive; hemoglobin stable at 12.5. -.   Gastroenterology has seen the patient.  No plan for any intervention.  She has history of cardiac arrest last month.  Recommend to continue with IV PPI twice daily in the hospital and discharged on Protonix 40 mg p.o. daily for 8 weeks. Recommend to restart Eliquis -Recommend Anusol suppository for possible hemorrhoids.   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 while in the ED for reported A-fib with RVR -Converted to normal sinus rhythm, IV diltiazem was discontinued -Lopressor 25 mg p.o. twice daily resumed which is patient's home dose -Eliquis will be restarted as above  Hyperbilirubinemia -Total bilirubin persistently elevated up to 3.5; improved to 2.5 -Right upper quadrant ultrasound on 4/21 showed gallbladder sludge without evidence of cholelithiasis or acute cholecystitis -CT abdomen/pelvis unremarkable for biliary hepatobiliary cause -Consider repeating right upper quadrant ultrasound if patient develops symptoms  Diabetes mellitus type 2 -Home medications on hold -Continue sliding scale insulin NovoLog -CBG well-controlled  Hypertension -Continue Lopressor, torsemide -Losartan and Aldactone on hold  Hyperlipidemia -Continue atorvastatin    Medications     apixaban  5 mg Oral BID   atorvastatin  80 mg Oral QPC supper   Chlorhexidine Gluconate Cloth  6 each Topical Daily   escitalopram  10 mg Oral QHS   gabapentin  100 mg Oral QHS   hydrocortisone  25 mg Rectal BID  insulin aspart  0-5 Units Subcutaneous QHS   insulin aspart  0-9 Units  Subcutaneous TID WC   metoprolol tartrate  25 mg Oral BID   pantoprazole (PROTONIX) IV  40 mg Intravenous Q12H   torsemide  20 mg Oral Daily     Data Reviewed:   CBG:  Recent Labs  Lab 11/18/22 2155 11/19/22 0741 11/19/22 1132 11/19/22 1718 11/19/22 2104  GLUCAP 237* 201* 160* 231* 147*    SpO2: 98 %    Vitals:   11/20/22 0300 11/20/22 0400 11/20/22 0500 11/20/22 0600  BP: (!) 101/51 120/65 119/77 120/82  Pulse:      Resp: 15 10 13 10   Temp:  98.3 F (36.8 C)    TempSrc:  Oral    SpO2:  100%  98%  Weight:      Height:          Data Reviewed:  Basic Metabolic Panel: Recent Labs  Lab 11/13/22 1553 11/18/22 1119 11/19/22 0717  NA 133* 134* 133*  K 3.1* 4.5 3.0*  CL 92* 92* 92*  CO2 26 24 28   GLUCOSE 171* 296* 219*  BUN 15 19 21*  CREATININE 1.16* 0.89 1.07*  CALCIUM 10.0 9.6 9.4    CBC: Recent Labs  Lab 11/13/22 1553 11/18/22 1119 11/19/22 0717 11/20/22 0306  WBC 9.5 14.0* 7.2 8.2  NEUTROABS 6.5 11.4*  --   --   HGB 15.1* 14.4 13.0 12.5  HCT 47.4* 46.0 41.8 40.5  MCV 70.0* 71.1* 71.9* 72.2*  PLT 285 303 281 262    LFT Recent Labs  Lab 11/13/22 1553 11/18/22 1119 11/19/22 0717  AST 47* 38 30  ALT 35 23 19  ALKPHOS 228* 172* 144*  BILITOT 2.6* 3.5* 2.5*  PROT 8.8* 8.7* 7.9  ALBUMIN 3.3* 3.4* 3.0*     Antibiotics: Anti-infectives (From admission, onward)    None        DVT prophylaxis: SCDs  Code Status: Full code  Family Communication:    CONSULTS gastroenterology   Objective    Physical Examination:  General-appears in no acute distress Heart-S1-S2, regular, no murmur auscultated Lungs-clear to auscultation bilaterally, no wheezing or crackles auscultated Abdomen-soft, nontender, no organomegaly Extremities-no edema in the lower extremities Neuro-alert, oriented x3, no focal deficit noted  Status is: Inpatient:             Ludwig Tugwell S Robie Oats   [facility] If 7PM-7AM, please contact  night-coverage at [email or web address], Office  [phone]   11/20/2022, 7:58 AM  LOS: 2 days

## 2022-11-21 DIAGNOSIS — K920 Hematemesis: Secondary | ICD-10-CM | POA: Diagnosis not present

## 2022-11-21 DIAGNOSIS — E1169 Type 2 diabetes mellitus with other specified complication: Secondary | ICD-10-CM | POA: Diagnosis not present

## 2022-11-21 DIAGNOSIS — I513 Intracardiac thrombosis, not elsewhere classified: Secondary | ICD-10-CM | POA: Diagnosis not present

## 2022-11-21 DIAGNOSIS — Z8673 Personal history of transient ischemic attack (TIA), and cerebral infarction without residual deficits: Secondary | ICD-10-CM | POA: Diagnosis not present

## 2022-11-21 LAB — BASIC METABOLIC PANEL
Anion gap: 12 (ref 5–15)
BUN: 15 mg/dL (ref 6–20)
CO2: 29 mmol/L (ref 22–32)
Calcium: 8.9 mg/dL (ref 8.9–10.3)
Chloride: 91 mmol/L — ABNORMAL LOW (ref 98–111)
Creatinine, Ser: 0.76 mg/dL (ref 0.44–1.00)
GFR, Estimated: >60 mL/min (ref >60)
Glucose, Bld: 209 mg/dL — ABNORMAL HIGH (ref 70–99)
Potassium: 3.2 mmol/L — ABNORMAL LOW (ref 3.5–5.1)
Sodium: 132 mmol/L — ABNORMAL LOW (ref 135–145)

## 2022-11-21 LAB — GLUCOSE, CAPILLARY
Glucose-Capillary: 213 mg/dL — ABNORMAL HIGH (ref 70–99)
Glucose-Capillary: 285 mg/dL — ABNORMAL HIGH (ref 70–99)
Glucose-Capillary: 220 mg/dL — ABNORMAL HIGH (ref 70–99)
Glucose-Capillary: 123 mg/dL — ABNORMAL HIGH (ref 70–99)

## 2022-11-21 LAB — CBC
HCT: 42.8 % (ref 36.0–46.0)
Hemoglobin: 13.1 g/dL (ref 12.0–15.0)
MCH: 22.2 pg — ABNORMAL LOW (ref 26.0–34.0)
MCHC: 30.6 g/dL (ref 30.0–36.0)
MCV: 72.5 fL — ABNORMAL LOW (ref 80.0–100.0)
Platelets: 297 10*3/uL (ref 150–400)
RBC: 5.9 MIL/uL — ABNORMAL HIGH (ref 3.87–5.11)
RDW: 21.9 % — ABNORMAL HIGH (ref 11.5–15.5)
WBC: 8.3 10*3/uL (ref 4.0–10.5)
nRBC: 0 % (ref 0.0–0.2)

## 2022-11-21 LAB — POTASSIUM
Potassium: 3.1 mmol/L — ABNORMAL LOW (ref 3.5–5.1)
Potassium: 4.3 mmol/L (ref 3.5–5.1)

## 2022-11-21 LAB — MAGNESIUM
Magnesium: 1.5 mg/dL — ABNORMAL LOW (ref 1.7–2.4)
Magnesium: 1.7 mg/dL (ref 1.7–2.4)

## 2022-11-21 MED ORDER — MAGNESIUM SULFATE 2 GM/50ML IV SOLN
2.0000 g | Freq: Once | INTRAVENOUS | Status: AC
  Administered 2022-11-21: 2 g via INTRAVENOUS
  Filled 2022-11-21: qty 50

## 2022-11-21 MED ORDER — POTASSIUM CHLORIDE 10 MEQ/100ML IV SOLN
10.0000 meq | INTRAVENOUS | Status: AC
  Administered 2022-11-21 (×3): 10 meq via INTRAVENOUS
  Filled 2022-11-21 (×3): qty 100

## 2022-11-21 MED ORDER — POTASSIUM CHLORIDE 20 MEQ PO PACK
40.0000 meq | PACK | Freq: Once | ORAL | Status: AC
  Administered 2022-11-21: 40 meq via ORAL
  Filled 2022-11-21: qty 2

## 2022-11-21 NOTE — Progress Notes (Signed)
Called and updated patient's father, Bernette Redbird, with discharge plan of tomorrow. He will be the one to call at discharge to help with getting patient home.

## 2022-11-21 NOTE — Progress Notes (Signed)
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 Patient recently admitted 09/22/2022-10/01/2022 for right MCA stroke thought to be embolic from large LV thrombus in the setting of subtherapeutic Coumadin prior to admission.  She underwent IR endovascular revascularization.  Anticoagulation was changed to Eliquis.  She had brief cardiac arrest during IR procedure requiring 2 minutes of ACLS before ROSC achieved.  She was discharged to inpatient rehab and subsequently discharged to home from rehab 11/01/2022.   Patient states for last few days she has been having frequent nausea vomiting.  Emesis was reported as coffee-ground appearance.  Also noted some dark red blood in the stool.  Patient has been taking Eliquis she had EGD performed on 11/05/2021 2000 normal. In the ED FOBT was positive. CTA GI bleed study was negative for intraluminal contrast extravasation or other finding to localize lower GI bleed.  Normal contour and caliber of abdominal aorta noted.  No evidence of aneurysm dissection or other aortic pathology. [facility] gastroenterology was consulted.   Subjective   Patient seen and examined, no new complaints.   Assessment/Plan:    Upper GI bleed with coffee-ground emesis/melena -Patient has been taking Eliquis due to history of A-fib and LV thrombus and recent stroke -Presented with several days of coffee-ground emesis and melena -FOBT positive; hemoglobin stable at 12.5. -.  Gastroenterology has seen the patient.  No plan for any intervention.  She has history of  cardiac arrest last month.  Recommend to continue with IV PPI twice daily in the hospital and discharged on Protonix 40 mg p.o. daily for 8 weeks. Recommend to restart Eliquis -Recommend Anusol suppository for possible hemorrhoids.   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 while in the ED for reported A-fib with RVR -Converted to normal sinus rhythm, IV diltiazem was discontinued -Lopressor 25 mg p.o. twice daily resumed which is patient's home dose -Eliquis will be restarted as above  Hyperbilirubinemia -Total bilirubin persistently elevated up to 3.5; improved to 2.5 -Right upper quadrant ultrasound on 4/21 showed gallbladder sludge without evidence of cholelithiasis or acute cholecystitis -CT abdomen/pelvis unremarkable for biliary hepatobiliary cause -Consider repeating right upper quadrant ultrasound if patient develops symptoms  Diabetes mellitus type 2 -Home medications on hold -Continue sliding scale insulin NovoLog -CBG well-controlled  Hypertension -Continue Lopressor, torsemide -Losartan and Aldactone on hold  Hyperlipidemia -Continue atorvastatin    Medications     apixaban  5 mg Oral BID   atorvastatin  80 mg Oral QPC supper   Chlorhexidine Gluconate Cloth  6 each Topical Daily   escitalopram  10 mg Oral QHS   gabapentin  100 mg Oral QHS   hydrocortisone  25 mg Rectal BID   insulin aspart  0-5 Units Subcutaneous QHS   insulin aspart  0-9 Units  Subcutaneous TID WC   metoprolol tartrate  25 mg Oral BID   pantoprazole (PROTONIX) IV  40 mg Intravenous Q12H   torsemide  20 mg Oral Daily     Data Reviewed:   CBG:  Recent Labs  Lab 11/20/22 1219 11/20/22 1633 11/20/22 2149 11/21/22 0757 11/21/22 1118  GLUCAP 247* 202* 227* 213* 285*    SpO2: 99 %    Vitals:   11/21/22 0900 11/21/22 1000 11/21/22 1200 11/21/22 1205  BP: (!) 170/80 106/69 (!) 138/122 (!) 137/95  Pulse:      Resp: (!) 25   14  Temp:    98.5 F (36.9 C)  TempSrc:    Oral  SpO2:  100%  99%  Weight:      Height:          Data Reviewed:  Basic Metabolic Panel: Recent Labs  Lab 11/18/22 1119 11/19/22 0717 11/20/22 0846 11/21/22 0849 11/21/22 0850 11/21/22 1351  NA 134* 133* 132*  --  132*  --   K 4.5 3.0* 2.9*  --  3.2* 3.1*  CL 92* 92* 90*  --  91*  --   CO2 24 28 28   --  29  --   GLUCOSE 296* 219* 204*  --  209*  --   BUN 19 21* 22*  --  15  --   CREATININE 0.89 1.07* 0.98  --  0.76  --   CALCIUM 9.6 9.4 8.8*  --  8.9  --   MG  --   --   --  1.5*  --   --     CBC: Recent Labs  Lab 11/18/22 1119 11/19/22 0717 11/20/22 0306 11/21/22 0850  WBC 14.0* 7.2 8.2 8.3  NEUTROABS 11.4*  --   --   --   HGB 14.4 13.0 12.5 13.1  HCT 46.0 41.8 40.5 42.8  MCV 71.1* 71.9* 72.2* 72.5*  PLT 303 281 262 297    LFT Recent Labs  Lab 11/18/22 1119 11/19/22 0717  AST 38 30  ALT 23 19  ALKPHOS 172* 144*  BILITOT 3.5* 2.5*  PROT 8.7* 7.9  ALBUMIN 3.4* 3.0*     Antibiotics: Anti-infectives (From admission, onward)    None        DVT prophylaxis: SCDs  Code Status: Full code  Family Communication:    CONSULTS gastroenterology   Objective    Physical Examination:  General-appears in no acute distress Heart-S1-S2, regular, no murmur auscultated Lungs-clear to auscultation bilaterally, no wheezing or crackles auscultated Abdomen-soft, nontender, no organomegaly Extremities-no edema in the lower  extremities Neuro-alert, oriented x3, no focal deficit noted  Status is: Inpatient:             Karla Vines S Abbye Lao   [facility] If 7PM-7AM, please contact night-coverage at [email or web address], Office  [phone]   11/21/2022, 3:07 PM  LOS: 3 days

## 2022-11-22 ENCOUNTER — Other Ambulatory Visit (HOSPITAL_COMMUNITY): Payer: Self-pay

## 2022-11-22 DIAGNOSIS — I513 Intracardiac thrombosis, not elsewhere classified: Secondary | ICD-10-CM | POA: Diagnosis not present

## 2022-11-22 DIAGNOSIS — I152 Hypertension secondary to endocrine disorders: Secondary | ICD-10-CM

## 2022-11-22 DIAGNOSIS — K920 Hematemesis: Secondary | ICD-10-CM | POA: Diagnosis not present

## 2022-11-22 DIAGNOSIS — Z8673 Personal history of transient ischemic attack (TIA), and cerebral infarction without residual deficits: Secondary | ICD-10-CM | POA: Diagnosis not present

## 2022-11-22 DIAGNOSIS — E1159 Type 2 diabetes mellitus with other circulatory complications: Secondary | ICD-10-CM

## 2022-11-22 DIAGNOSIS — E1169 Type 2 diabetes mellitus with other specified complication: Secondary | ICD-10-CM | POA: Diagnosis not present

## 2022-11-22 LAB — BASIC METABOLIC PANEL WITH GFR
Anion gap: 10 (ref 5–15)
BUN: 12 mg/dL (ref 6–20)
CO2: 28 mmol/L (ref 22–32)
Calcium: 8.8 mg/dL — ABNORMAL LOW (ref 8.9–10.3)
Chloride: 91 mmol/L — ABNORMAL LOW (ref 98–111)
Creatinine, Ser: 0.66 mg/dL (ref 0.44–1.00)
GFR, Estimated: >60 mL/min
Glucose, Bld: 166 mg/dL — ABNORMAL HIGH (ref 70–99)
Potassium: 3.4 mmol/L — ABNORMAL LOW (ref 3.5–5.1)
Sodium: 129 mmol/L — ABNORMAL LOW (ref 135–145)

## 2022-11-22 LAB — GLUCOSE, CAPILLARY
Glucose-Capillary: 246 mg/dL — ABNORMAL HIGH (ref 70–99)
Glucose-Capillary: 360 mg/dL — ABNORMAL HIGH (ref 70–99)

## 2022-11-22 MED ORDER — LOSARTAN POTASSIUM 50 MG PO TABS
50.0000 mg | ORAL_TABLET | Freq: Every day | ORAL | Status: DC
  Administered 2022-11-22: 50 mg via ORAL
  Filled 2022-11-22: qty 1

## 2022-11-22 MED ORDER — HYDROCORTISONE ACETATE 25 MG RE SUPP
25.0000 mg | Freq: Two times a day (BID) | RECTAL | 0 refills | Status: DC | PRN
  Filled 2022-11-22: qty 14, 7d supply, fill #0

## 2022-11-22 MED ORDER — SPIRONOLACTONE 25 MG PO TABS
25.0000 mg | ORAL_TABLET | Freq: Every day | ORAL | Status: DC
  Administered 2022-11-22: 25 mg via ORAL
  Filled 2022-11-22: qty 1

## 2022-11-22 MED ORDER — BUTALBITAL-APAP-CAFFEINE 50-325-40 MG PO TABS
1.0000 | ORAL_TABLET | Freq: Once | ORAL | Status: AC | PRN
  Administered 2022-11-22: 1 via ORAL
  Filled 2022-11-22: qty 1

## 2022-11-22 MED ORDER — PANTOPRAZOLE SODIUM 40 MG PO TBEC
40.0000 mg | DELAYED_RELEASE_TABLET | Freq: Every day | ORAL | 0 refills | Status: DC
  Filled 2022-11-22: qty 60, 60d supply, fill #0

## 2022-11-22 MED ORDER — MAGNESIUM SULFATE IN D5W 1-5 GM/100ML-% IV SOLN
1.0000 g | Freq: Once | INTRAVENOUS | Status: AC
  Administered 2022-11-22: 1 g via INTRAVENOUS
  Filled 2022-11-22: qty 100

## 2022-11-22 MED ORDER — POTASSIUM CHLORIDE CRYS ER 20 MEQ PO TBCR
40.0000 meq | EXTENDED_RELEASE_TABLET | Freq: Once | ORAL | Status: AC
  Administered 2022-11-22: 40 meq via ORAL
  Filled 2022-11-22: qty 2

## 2022-11-22 MED ORDER — OXYCODONE HCL 5 MG PO TABS
5.0000 mg | ORAL_TABLET | Freq: Four times a day (QID) | ORAL | 0 refills | Status: DC | PRN
  Filled 2022-11-22: qty 15, 4d supply, fill #0

## 2022-11-22 NOTE — Discharge Summary (Signed)
Physician Discharge Summary   Patient: [name] MRN: [id number] DOB: [birth year]  Admit date:     11/18/2022  Discharge date: 11/22/22  Discharge Physician: Meredeth Ide   PCP: Reece Leader, DO   Recommendations at discharge:   Follow-up PCP in 2 weeks -Check BMP in 2 weeks  Discharge Diagnoses: Principal Problem:   Coffee ground emesis Active Problems:   Hyperlipidemia associated with type 2 diabetes mellitus (HCC)   LV (left ventricular) mural thrombus   History of right MCA stroke   Paroxysmal atrial fibrillation (HCC)   Chronic combined systolic and diastolic heart failure (HCC)   Hypertension associated with diabetes (HCC)   Hyperbilirubinemia  Resolved Problems:   * No resolved hospital problems. *  Hospital Course: 47 year old female with medical history of chronic combined systolic and diastolic CHF, EF 40%, paroxysmal atrial fibrillation on Eliquis, NICM, recent right MCA stroke thought to be embolic due to LV thrombus, resolved left-sided weakness, diabetes mellitus type 2, hypertension, hyperlipidemia came to ED for evaluation of coffee-ground emesis. Patient recently admitted 09/22/2022-10/01/2022 for right MCA stroke thought to be embolic from large LV thrombus in the setting of subtherapeutic Coumadin prior to admission.  She underwent IR endovascular revascularization.  Anticoagulation was changed to Eliquis.  She had brief cardiac arrest during IR procedure requiring 2 minutes of ACLS before ROSC achieved.  She was discharged to inpatient rehab and subsequently discharged to home from rehab 11/01/2022.   Patient states for last few days she has been having frequent nausea vomiting.  Emesis was reported as coffee-ground appearance.  Also noted some dark red blood in the stool.  Patient has been taking Eliquis she had EGD performed on 11/05/2021 2000 normal. In the ED FOBT was positive. CTA GI bleed study was negative for intraluminal contrast extravasation or other  finding to localize lower GI bleed.  Normal contour and caliber of abdominal aorta noted.  No evidence of aneurysm dissection or other aortic pathology. [facility] gastroenterology was consulted.  Assessment and Plan:   Upper GI bleed with coffee-ground emesis/melena -Patient has been taking Eliquis due to history of A-fib and LV thrombus and recent stroke -Presented with several days of coffee-ground emesis and melena -FOBT positive; hemoglobin stable at 12.5. -.  Gastroenterology has seen the patient.  No plan for any intervention.  She has history of cardiac arrest last month.  Recommend to continue with IV PPI twice daily in the hospital and discharged on Protonix 40 mg p.o. daily for 8 weeks. Recommend to restart Eliquis -Recommend Anusol suppository for possible hemorrhoids.     Recent embolic right MCA stroke due to LV thrombus -S/p IR endovascular revascularization of occluded inferior division of right MCA on 09/24/2022 -Has residual left-sided weakness, has been using wheelchair as outpatient -Eliquis on hold due to concern for GI bleed -Would resume anticoagulation  -Continue atorvastatin   Hypokalemia -Improved with supplementation -This morning potassium was 3.4, will replace before discharge.   Paroxysmal atrial fibrillation -She was placed on IV diltiazem gtt. while in the ED for reported A-fib with RVR -Converted to normal sinus rhythm, IV diltiazem was discontinued -Lopressor 25 mg p.o. twice daily resumed which is patient's home dose -Eliquis has been restarted    Hyperbilirubinemia -Total bilirubin persistently elevated up to 3.5; improved to 2.5 -Right upper quadrant ultrasound on 4/21 showed gallbladder sludge without evidence of cholelithiasis or acute cholecystitis -CT abdomen/pelvis unremarkable for biliary hepatobiliary cause -Consider repeating right upper quadrant ultrasound if patient develops symptoms  Diabetes mellitus type 2 -Continue home  regimen  Hypertension -Continue Lopressor, torsemide -Losartan and Aldactone    Hyperlipidemia -Continue atorvastatin  Chronic pain syndrome -Patient has chronic pain, was prescribed MS Contin during previous hospitalization -Will discharge on oxycodone 5 mg p.o. every 6 hours as needed for pain.       Consultants:  Procedures performed:  Disposition: Home Diet recommendation:  Discharge Diet Orders (From admission, onward)     Start     Ordered   11/22/22 0000  Diet - low sodium heart healthy        11/22/22 1315           Cardiac diet DISCHARGE MEDICATION: Allergies as of 11/22/2022       Reactions   Fish Allergy Swelling   Shellfish Allergy Anaphylaxis, Swelling, Other (See Comments)   Airway involvement including EMS - Has Epi-Pen   Metformin And Related Diarrhea   Trulicity [dulaglutide] Nausea And Vomiting, Other (See Comments)   Multiple episodes of vomiting over multiple days   Nsaids Other (See Comments)   Per PCP interferes with daily meds (heart failure)   Advil [ibuprofen] Other (See Comments)   Per PCP interferes with daily meds (heart failure)        Medication List     STOP taking these medications    morphine 15 MG tablet Commonly known as: MSIR       TAKE these medications    Accu-Chek Softclix Lancets lancets Use up 4 (four) times daily as directed   acetaminophen 500 MG tablet Commonly known as: TYLENOL Take 500-1,000 mg by mouth every 6 (six) hours as needed for mild pain or headache.   amantadine 100 MG capsule Commonly known as: SYMMETREL Take 2 capsules (200 mg total) by mouth daily.   atorvastatin 80 MG tablet Commonly known as: LIPITOR Take 1 tablet (80 mg total) by mouth daily after supper.   blood glucose meter kit and supplies Use up to 4 (four) times daily as directed   Blood Glucose Monitoring Suppl Devi 1 each by Does not apply route in the morning, at noon, and at bedtime. May substitute to any  manufacturer covered by patient's insurance.   Calcium Antacid 500 MG chewable tablet Generic drug: calcium carbonate Chew 1 tablet (200 mg of elemental calcium total) by mouth daily after supper.   docusate sodium 100 MG capsule Commonly known as: COLACE Take 1 capsule (100 mg total) by mouth daily.   Eliquis 5 MG Tabs tablet Generic drug: apixaban Take 1 tablet (5 mg total) by mouth 2 (two) times daily.   escitalopram 10 MG tablet Commonly known as: LEXAPRO Take 1 tablet (10 mg total) by mouth at bedtime.   gabapentin 100 MG capsule Commonly known as: NEURONTIN Take 1 capsule (100 mg total) by mouth at bedtime.   glipiZIDE 5 MG tablet Commonly known as: GLUCOTROL Take 1 tablet (5 mg total) by mouth daily before breakfast.   hydrocortisone 25 MG suppository Commonly known as: ANUSOL-HC Place 1 suppository (25 mg total) rectally 2 (two) times daily as needed for hemorrhoids or anal itching.   hydrOXYzine 10 MG tablet Commonly known as: ATARAX Take 1 tablet (10 mg total) by mouth 3 (three) times daily as needed.   Jardiance 25 MG Tabs tablet Generic drug: empagliflozin Take 1 tablet (25 mg total) by mouth daily.   Lancet Device Misc 1 each by Does not apply route in the morning, at noon, and at bedtime. May substitute to any  manufacturer covered by [employer].   Lancets Misc. Misc 1 each by Does not apply route in the morning, at noon, and at bedtime. May substitute to any manufacturer covered by patient's insurance.   losartan 50 MG tablet Commonly known as: COZAAR Take 1 tablet (50 mg total) by mouth daily.   megestrol 40 MG/ML suspension Commonly known as: MEGACE Take 10 mLs (400 mg total) by mouth 2 (two) times daily.   melatonin 5 MG Tabs Take 1 tablet (5 mg total) by mouth at bedtime.   methocarbamol 500 MG tablet Commonly known as: ROBAXIN Take 1 tablet (500 mg total) by mouth every 6 (six) hours as needed for muscle spasms.   metoprolol  tartrate 25 MG tablet Commonly known as: LOPRESSOR Take 1 tablet (25 mg total) by mouth 2 (two) times daily.   multivitamin with minerals Tabs tablet Take 1 tablet by mouth daily.   Muscle Rub 10-15 % Crea Apply 1 Application topically 3 (three) times daily.   OneTouch Verio test strip Generic drug: glucose blood Use up to 4 (four) times daily as directed   BLOOD GLUCOSE TEST STRIPS Strp Inject 1 each into the skin 4 (four) times daily -  before meals and at bedtime. May substitute to any manufacturer covered by patient's insurance.   oxyCODONE 5 MG immediate release tablet Commonly known as: Oxy IR/ROXICODONE Take 1 tablet (5 mg total) by mouth every 6 (six) hours as needed for severe pain.   pantoprazole 40 MG tablet Commonly known as: PROTONIX Take 1 tablet (40 mg total) by mouth at bedtime.   polyethylene glycol powder 17 GM/SCOOP powder Commonly known as: GLYCOLAX/MIRALAX Take 1 capful (17 g) by mouth 2 (two) times daily.   Senexon-S 8.6-50 MG tablet Generic drug: senna-docusate Take 3 tablets by mouth daily with lunch.   spironolactone 25 MG tablet Commonly known as: ALDACTONE Take 1 tablet (25 mg total) by mouth daily.   torsemide 20 MG tablet Commonly known as: DEMADEX Take 1 tablet (20 mg total) by mouth daily.   traZODone 50 MG tablet Commonly known as: DESYREL Take 0.5 tablets (25 mg total) by mouth at bedtime.   Vitamin D (Ergocalciferol) 1.25 MG (50000 UNIT) Caps capsule Commonly known as: DRISDOL Take 1 capsule (50,000 Units total) by mouth every 7 (seven) days.        Follow-up Information     Ganta, Anupa, DO Follow up in 2 week(s).   Specialty: Family Medicine Contact information: [address] [place] [address] [phone]                Discharge Exam: Ceasar Mons Weights   11/20/22 0253 11/21/22 0500 11/22/22 0500  Weight: 60.5 kg 61.3 kg 61.2 kg   General-appears in no acute distress Heart-S1-S2, regular, no murmur  auscultated Lungs-clear to auscultation bilaterally, no wheezing or crackles auscultated Abdomen-soft, nontender, no organomegaly Extremities-no edema in the lower extremities Neuro-alert, oriented x3, no focal deficit noted  Condition at discharge: good  The results of significant diagnostics from this hospitalization (including imaging, microbiology, ancillary and laboratory) are listed below for reference.   Imaging Studies: CT ANGIO GI BLEED  Result Date: 11/18/2022 CLINICAL DATA:  Coffee-ground emesis, lower GI bleed EXAM: CTA ABDOMEN AND PELVIS WITHOUT AND WITH CONTRAST TECHNIQUE: Multidetector CT imaging of the abdomen and pelvis was performed using the standard protocol during bolus administration of intravenous contrast. Multiplanar reconstructed images and MIPs were obtained and reviewed to evaluate the vascular anatomy. RADIATION DOSE REDUCTION: This  exam was performed according to the departmental dose-optimization program which includes automated exposure control, adjustment of the mA and/or kV according to patient size and/or use of iterative reconstruction technique. CONTRAST:  OMNIPAQUE IOHEXOL 350 MG/ML SOLN COMPARISON:  None Available. FINDINGS: VASCULAR Normal contour and caliber of the abdominal aorta. No evidence of aneurysm, dissection, or other acute aortic pathology. Standard branching pattern of the abdominal aorta with solitary bilateral renal arteries. Moderate mixed calcific atherosclerosis. Review of the MIP images confirms the above findings. NON-VASCULAR Lower chest: Cardiomegaly.  Coronary artery calcifications. Hepatobiliary: No solid liver abnormality is seen. No gallstones, gallbladder wall thickening, or biliary dilatation. Pancreas: Unremarkable. No pancreatic ductal dilatation or surrounding inflammatory changes. Spleen: Normal in size without significant abnormality. Adrenals/Urinary Tract: Adrenal glands are unremarkable simple, benign bilateral renal  cortical cysts, for which no further follow-up or characterization is required. Kidneys are otherwise normal, without renal calculi, solid lesion, or hydronephrosis. Bladder is unremarkable. Stomach/Bowel: Stomach is within normal limits. Appendix appears normal. No evidence of bowel wall thickening, distention, or inflammatory changes. Lymphatic: No enlarged abdominal or pelvic lymph nodes. Reproductive: No mass or other significant abnormality. Other: No abdominal wall hernia or abnormality. No ascites. Musculoskeletal: No acute osseous findings. IMPRESSION: 1. No intraluminal contrast extravasation or other findings to localize lower GI bleeding. 2. Normal contour and caliber of the abdominal aorta. No evidence of aneurysm, dissection, or other acute aortic pathology. 3. Moderate mixed calcific atherosclerosis. 4. Cardiomegaly and coronary artery disease. Electronically Signed   By: Jearld Lesch M.D.   On: 11/18/2022 18:00   US Abdomen Limited RUQ (LIVER/GB)  Result Date: 11/13/2022 CLINICAL DATA:  Right upper quadrant pain. EXAM: ULTRASOUND ABDOMEN LIMITED RIGHT UPPER QUADRANT COMPARISON:  June 24, 2022 FINDINGS: Gallbladder: A mild amount of shadowing echogenic sludge is seen within the dependent portion of the gallbladder lumen. No gallstones are identified. The gallbladder wall measures 4.1 mm in thickness. No sonographic Murphy sign noted by sonographer. Common bile duct: Diameter: 3.8 mm Liver: No focal lesion identified. Within normal limits in parenchymal echogenicity. Portal vein is patent on color Doppler imaging with normal direction of blood flow towards the liver. Other: None. IMPRESSION: Gallbladder sludge without evidence of cholelithiasis or acute cholecystitis. Electronically Signed   By: Aram Candela M.D.   On: 11/13/2022 20:05   DG Shoulder Left  Result Date: 11/13/2022 CLINICAL DATA:  Left shoulder pain for a few weeks. EXAM: LEFT SHOULDER - 2+ VIEW COMPARISON:  Multiple prior  chest radiographs. FINDINGS: There is inferior subluxation of the humeral head relative to the glenoid, new since 08/05/2022. No acute fracture is identified. There is no shoulder joint dislocation. Soft tissues appear normal. IMPRESSION: Inferior subluxation of the humeral head relative to the glenoid. This is favored to reflect a space occupying effusion as opposed to joint laxity given the new onset of this finding since January 2024. Electronically Signed   By: Romona Curls M.D.   On: 11/13/2022 15:38   DG Chest Port 1 View  Result Date: 11/13/2022 CLINICAL DATA:  Chest pain for about 2 days. EXAM: PORTABLE CHEST 1 VIEW COMPARISON:  Chest radiograph dated 09/28/2022. FINDINGS: The heart is enlarged. The lungs are clear. The osseous structures appear intact. IMPRESSION: 1. Cardiomegaly. 2. No acute pulmonary process. Electronically Signed   By: Romona Curls M.D.   On: 11/13/2022 15:22   VAS [place] LOWER EXTREMITY VENOUS (DVT)  Result Date: 11/11/2022  Lower Venous DVT Study Patient Name:  SHALANDA BROGDEN  Date of Exam:   11/11/2022 Medical Rec #: [id number]        Accession #:    [id number] Date of Birth: 1975/12/25         Patient Gender: F Patient Age:   33 years Exam Location:  [facility] Procedure:      VAS [place] LOWER EXTREMITY VENOUS (DVT) Referring Phys: Lowanda Foster MCINTYRE --------------------------------------------------------------------------------  Indications: Stroke, and Pain in toes and behind left leg.  Comparison Study: 10/08/22 - LEV - negative Performing Technologist: Marilynne Halsted RDMS, RVT  Examination Guidelines: A complete evaluation includes B-mode imaging, spectral Doppler, color Doppler, and power Doppler as needed of all accessible portions of each vessel. Bilateral testing is considered an integral part of a complete examination. Limited examinations for reoccurring indications may be performed as noted. The reflux portion of the exam is performed with the patient in reverse  Trendelenburg.  +-----+---------------+---------+-----------+----------+--------------+ RIGHTCompressibilityPhasicitySpontaneityPropertiesThrombus Aging +-----+---------------+---------+-----------+----------+--------------+ CFV  Full           Yes      Yes                                 +-----+---------------+---------+-----------+----------+--------------+ SFJ  Full                                                        +-----+---------------+---------+-----------+----------+--------------+   +---------+---------------+---------+-----------+----------+--------------+ LEFT     CompressibilityPhasicitySpontaneityPropertiesThrombus Aging +---------+---------------+---------+-----------+----------+--------------+ CFV      Full           Yes      Yes                                 +---------+---------------+---------+-----------+----------+--------------+ SFJ      Full                                                        +---------+---------------+---------+-----------+----------+--------------+ FV Prox  Full                                                        +---------+---------------+---------+-----------+----------+--------------+ FV Mid   Full                                                        +---------+---------------+---------+-----------+----------+--------------+ FV DistalFull                                                        +---------+---------------+---------+-----------+----------+--------------+ PFV      Full                                                        +---------+---------------+---------+-----------+----------+--------------+  POP      Full           Yes      Yes                                 +---------+---------------+---------+-----------+----------+--------------+ PTV      Full                                                         +---------+---------------+---------+-----------+----------+--------------+ PERO     Full                                                        +---------+---------------+---------+-----------+----------+--------------+     Summary: RIGHT: - No evidence of common femoral vein obstruction.  LEFT: - There is no evidence of deep vein thrombosis in the lower extremity.  - No cystic structure found in the popliteal fossa.  *See table(s) above for measurements and observations. Electronically signed by Sherald Hess MD on 11/11/2022 at 3:16:38 PM.    Final     Microbiology: Results for orders placed or performed during the hospital encounter of 11/18/22  MRSA Next Gen by PCR, Nasal     Status: None   Collection Time: 11/18/22  9:30 PM   Specimen: Nasal Mucosa; Nasal Swab  Result Value Ref Range Status   MRSA by PCR Next Gen NOT DETECTED NOT DETECTED Final    Comment: (NOTE) The GeneXpert MRSA Assay (FDA approved for NASAL specimens only), is one component of a comprehensive MRSA colonization surveillance program. It is not intended to diagnose MRSA infection nor to guide or monitor treatment for MRSA infections. Test performance is not FDA approved in patients less than 89 years old. Performed at [facility], [address] [place], [place] [address]     Labs: CBC: Recent Labs  Lab 11/18/22 1119 11/19/22 0717 11/20/22 0306 11/21/22 0850  WBC 14.0* 7.2 8.2 8.3  NEUTROABS 11.4*  --   --   --   HGB 14.4 13.0 12.5 13.1  HCT 46.0 41.8 40.5 42.8  MCV 71.1* 71.9* 72.2* 72.5*  PLT 303 281 262 297   Basic Metabolic Panel: Recent Labs  Lab 11/18/22 1119 11/19/22 0717 11/20/22 0846 11/21/22 0849 11/21/22 0850 11/21/22 1351 11/21/22 2246 11/22/22 0322  NA 134* 133* 132*  --  132*  --   --  129*  K 4.5 3.0* 2.9*  --  3.2* 3.1* 4.3 3.4*  CL 92* 92* 90*  --  91*  --   --  91*  CO2 24 28 28   --  29  --   --  28  GLUCOSE 296* 219* 204*  --  209*  --   --   166*  BUN 19 21* 22*  --  15  --   --  12  CREATININE 0.89 1.07* 0.98  --  0.76  --   --  0.66  CALCIUM 9.6 9.4 8.8*  --  8.9  --   --  8.8*  MG  --   --   --  1.5*  --   --  1.7  --    Liver Function Tests: Recent Labs  Lab 11/18/22 1119 11/19/22 0717  AST 38 30  ALT 23 19  ALKPHOS 172* 144*  BILITOT 3.5* 2.5*  PROT 8.7* 7.9  ALBUMIN 3.4* 3.0*   CBG: Recent Labs  Lab 11/21/22 1118 11/21/22 1709 11/21/22 2224 11/22/22 0759 11/22/22 1234  GLUCAP 285* 220* 123* 246* 360*    Discharge time spent: greater than 30 minutes.  Signed: Meredeth Ide, MD [facility] 11/22/2022

## 2022-11-23 ENCOUNTER — Telehealth: Payer: Self-pay

## 2022-11-23 NOTE — Transitions of Care (Post Inpatient/ED Visit) (Signed)
   11/23/2022  Name: [name] MRN: [id number] DOB: Dec 01, 1975  Today's TOC FU Call Status: Today's TOC FU Call Status:: Unsuccessul Call (1st Attempt) Unsuccessful Call (1st Attempt) Date: 11/23/22  Attempted to reach the patient regarding the most recent Inpatient/ED visit.  Follow Up Plan: Additional outreach attempts will be made to reach the patient to complete the Transitions of Care (Post Inpatient/ED visit) call.   Signature Karena Addison, LPN [facility] Nurse Health Advisor Direct Dial [phone]

## 2022-11-24 ENCOUNTER — Telehealth: Payer: Self-pay

## 2022-11-24 NOTE — Transitions of Care (Post Inpatient/ED Visit) (Signed)
   11/24/2022  Name: [name] MRN: [id number] DOB: [birth year]  Today's TOC FU Call Status: Today's TOC FU Call Status:: Unsuccessful Call (2nd Attempt) Unsuccessful Call (1st Attempt) Date: 11/23/22 Unsuccessful Call (2nd Attempt) Date: 11/24/22  Attempted to reach the patient regarding the most recent Inpatient/ED visit.  Follow Up Plan: Additional outreach attempts will be made to reach the patient to complete the Transitions of Care (Post Inpatient/ED visit) call.   Signature Karena Addison, LPN [facility] Nurse Health Advisor Direct Dial [phone]

## 2022-11-24 NOTE — Telephone Encounter (Signed)
April calls back.   She requests glucometer supplies for patient.   I do see where supplies were sent in by an outside provider, however was set to print. Patient does not have the paper prescription.   Will forward to PCP to send in.   She is testing TID.

## 2022-11-24 NOTE — Telephone Encounter (Signed)
April [facility] Nurse with [facility] calls nurse line requesting verbal orders as follows.   Nursing: 1x a week for 4 weeks  Aide: 1x a week for 3 weeks   Verbals given to April.

## 2022-11-25 ENCOUNTER — Other Ambulatory Visit: Payer: Self-pay | Admitting: Family Medicine

## 2022-11-25 ENCOUNTER — Other Ambulatory Visit (HOSPITAL_COMMUNITY): Payer: Self-pay

## 2022-11-25 MED ORDER — ONDANSETRON 4 MG PO TBDP
4.0000 mg | ORAL_TABLET | Freq: Three times a day (TID) | ORAL | 0 refills | Status: AC
  Filled 2022-11-25: qty 42, 14d supply, fill #0

## 2022-11-25 MED ORDER — BLOOD GLUCOSE MONITOR SYSTEM W/DEVICE KIT
1.0000 | PACK | Freq: Three times a day (TID) | 0 refills | Status: DC
  Filled 2022-11-25: qty 1, fill #0
  Filled 2022-12-20: qty 1, 1d supply, fill #0

## 2022-11-25 MED ORDER — ACCU-CHEK SOFTCLIX LANCETS MISC
Freq: Four times a day (QID) | 2 refills | Status: DC
  Filled 2022-11-25: qty 100, 25d supply, fill #0

## 2022-11-25 MED ORDER — ACCU-CHEK SOFTCLIX LANCETS MISC
Freq: Four times a day (QID) | 2 refills | Status: DC
  Filled 2022-11-25: qty 100, fill #0

## 2022-11-25 MED ORDER — BLOOD GLUCOSE MONITORING SUPPL DEVI: 1.0000 | Freq: Three times a day (TID) | 0 refills | Status: DC

## 2022-11-25 NOTE — Transitions of Care (Post Inpatient/ED Visit) (Signed)
11/25/2022  Name: [name] MRN: [id number] DOB: 1975-12-13  Today's TOC FU Call Status: Today's TOC FU Call Status:: Successful TOC FU Call Competed Unsuccessful Call (1st Attempt) Date: 11/23/22 Unsuccessful Call (2nd Attempt) Date: 11/24/22 [facility] FU Call Complete Date: 11/25/22  Transition Care Management Follow-up Telephone Call Date of Discharge: 11/22/22 Discharge Facility: [facility]) Type of Discharge: Inpatient Admission Primary Inpatient Discharge Diagnosis:: afib How have you been since you were released from the hospital?: Better Any questions or concerns?: No  Items Reviewed: Did you receive and understand the discharge instructions provided?: Yes Medications obtained,verified, and reconciled?: Yes (Medications Reviewed) Any new allergies since your discharge?: No Dietary orders reviewed?: Yes Do you have support at home?: Yes People in Home: parent(s)  Medications Reviewed Today: Medications Reviewed Today     Reviewed by Karena Addison, LPN (Licensed Practical Nurse) on 11/25/22 at 1510  Med List Status: <None>   Medication Order Taking? Sig Documenting Provider Last Dose Status Informant  Accu-Chek Softclix Lancets lancets [id number] Yes Use up 4 (four) times daily as directed Ganta, Anupa, DO Taking Active   acetaminophen (TYLENOL) 500 MG tablet [id number] Yes Take 500-1,000 mg by mouth every 6 (six) hours as needed for mild pain or headache. [provider] Taking Active Self  amantadine (SYMMETREL) 100 MG capsule [id number] Yes Take 2 capsules (200 mg total) by mouth daily. Jacquelynn Cree, PA-C Taking Active   apixaban (ELIQUIS) 5 MG TABS tablet [id number] Yes Take 1 tablet (5 mg total) by mouth 2 (two) times daily. Jacquelynn Cree, PA-C Taking Active   atorvastatin (LIPITOR) 80 MG tablet [id number] Yes Take 1 tablet (80 mg total) by mouth daily after supper. Jacquelynn Cree, PA-C Taking Active   blood glucose meter kit and supplies [id number] Yes  Use up to 4 (four) times daily as directed [place], Alvira Philips, DO Taking Active Self  Blood Glucose Monitoring Suppl (BLOOD GLUCOSE MONITOR SYSTEM) w/Device KIT [id number] Yes Use to check blood sugar in the morning, at noon, and at bedtime. Reece Leader, DO Taking Active   calcium carbonate (TUMS - DOSED IN MG ELEMENTAL CALCIUM) 500 MG chewable tablet [id number] Yes Chew 1 tablet (200 mg of elemental calcium total) by mouth daily after supper. Jerene Pitch Taking Active     Discontinued 07/01/22 1884          Med Note>> Skipper Cliche, [facility]   07/01/2022  9:52 AM changed to Toprol XL    docusate sodium (COLACE) 100 MG capsule [id number] Yes Take 1 capsule (100 mg total) by mouth daily. Jacquelynn Cree, PA-C Taking Active   empagliflozin (JARDIANCE) 25 MG TABS tablet [id number] Yes Take 1 tablet (25 mg total) by mouth daily. Jacquelynn Cree, PA-C Taking Active   escitalopram (LEXAPRO) 10 MG tablet [id number] Yes Take 1 tablet (10 mg total) by mouth at bedtime. Jacquelynn Cree, PA-C Taking Active   gabapentin (NEURONTIN) 100 MG capsule [id number] Yes Take 1 capsule (100 mg total) by mouth at bedtime. Levin Erp, MD Taking Active   glipiZIDE (GLUCOTROL) 5 MG tablet [id number] Yes Take 1 tablet (5 mg total) by mouth daily before breakfast. Love, Pamela S, PA-C Taking Active   Glucose Blood (BLOOD GLUCOSE TEST STRIPS) STRP [id number] Yes Inject 1 each into the skin 4 (four) times daily -  before meals and at bedtime. May substitute to any manufacturer covered by patient's insurance. Jacquelynn Cree, PA-C Taking Active   glucose blood (ONETOUCH VERIO)  test strip [id number] Yes Use up to 4 (four) times daily as directed [place], Alvira Philips, DO Taking Active Self  hydrocortisone (ANUSOL-HC) 25 MG suppository [id number] Yes Place 1 suppository (25 mg total) rectally 2 (two) times daily as needed for hemorrhoids or anal itching. Meredeth Ide, MD Taking Active   hydrOXYzine (ATARAX) 10 MG tablet [id number] Yes Take 1  tablet (10 mg total) by mouth 3 (three) times daily as needed. Jerene Pitch Taking Active   Lancet Device MISC [id number] Yes 1 each by Does not apply route in the morning, at noon, and at bedtime. May substitute to any manufacturer covered by patient's insurance. Jacquelynn Cree, PA-C Taking Active   Lancets Misc. MISC [id number] Yes 1 each by Does not apply route in the morning, at noon, and at bedtime. May substitute to any manufacturer covered by patient's insurance. Jacquelynn Cree, PA-C Taking Active   losartan (COZAAR) 50 MG tablet [id number] Yes Take 1 tablet (50 mg total) by mouth daily. Jacquelynn Cree, PA-C Taking Active   megestrol (MEGACE) 400 MG/10ML suspension [id number] Yes Take 10 mLs (400 mg total) by mouth 2 (two) times daily. Jacquelynn Cree, PA-C Taking Active   melatonin 5 MG TABS [id number] Yes Take 1 tablet (5 mg total) by mouth at bedtime. Jacquelynn Cree, PA-C Taking Active   Menthol-Methyl Salicylate (MUSCLE RUB) 10-15 % CREA [id number] Yes Apply 1 Application topically 3 (three) times daily. Jacquelynn Cree, PA-C Taking Active   methocarbamol (ROBAXIN) 500 MG tablet [id number] Yes Take 1 tablet (500 mg total) by mouth every 6 (six) hours as needed for muscle spasms. Jacquelynn Cree, PA-C Taking Active   metoprolol tartrate (LOPRESSOR) 25 MG tablet [id number] Yes Take 1 tablet (25 mg total) by mouth 2 (two) times daily. Jacquelynn Cree, PA-C Taking Active   Multiple Vitamin (MULTIVITAMIN WITH MINERALS) TABS tablet [id number] Yes Take 1 tablet by mouth daily. de Saintclair Halsted, Cortney E, NP Taking Active   ondansetron (ZOFRAN-ODT) 4 MG disintegrating tablet [id number] Yes Dissolve 1 tablet (4 mg total) in the mouth every 8 (eight) hours for 14 days. Reece Leader, DO Taking Active   oxyCODONE (OXY IR/ROXICODONE) 5 MG immediate release tablet [id number] Yes Take 1 tablet (5 mg total) by mouth every 6 (six) hours as needed for severe pain. Meredeth Ide, MD Taking Active   pantoprazole (PROTONIX)  40 MG tablet [id number] Yes Take 1 tablet (40 mg total) by mouth at bedtime. Meredeth Ide, MD Taking Active   polyethylene glycol powder (GLYCOLAX/MIRALAX) 17 GM/SCOOP powder [id number] Yes Take 1 capful (17 g) by mouth 2 (two) times daily. Jacquelynn Cree, PA-C Taking Active   senna-docusate (SENOKOT-S) 8.6-50 MG tablet [id number] Yes Take 3 tablets by mouth daily with lunch. Jacquelynn Cree, PA-C Taking Active   spironolactone (ALDACTONE) 25 MG tablet [id number] Yes Take 1 tablet (25 mg total) by mouth daily. Jacquelynn Cree, PA-C Taking Active   torsemide (DEMADEX) 20 MG tablet [id number] Yes Take 1 tablet (20 mg total) by mouth daily. Jacquelynn Cree, PA-C Taking Active   traZODone (DESYREL) 50 MG tablet [id number] Yes Take 0.5 tablets (25 mg total) by mouth at bedtime. Jacquelynn Cree, PA-C Taking Active   Vitamin D, Ergocalciferol, (DRISDOL) 1.25 MG (50000 UNIT) CAPS capsule [id number] Yes Take 1 capsule (50,000 Units total) by mouth every 7 (seven) days. Jacquelynn Cree, PA-C Taking Active  Home Care and Equipment/Supplies: Were Home Health Services Ordered?: NA Any new equipment or medical supplies ordered?: NA  Functional Questionnaire: Do you need assistance with bathing/showering or dressing?: No Do you need assistance with meal preparation?: No Do you need assistance with eating?: No Do you have difficulty maintaining continence: No Do you need assistance with getting out of bed/getting out of a chair/moving?: No Do you have difficulty managing or taking your medications?: No  Follow up appointments reviewed: PCP Follow-up appointment confirmed?: Yes Date of PCP follow-up appointment?: 11/28/22 Follow-up Provider: Dr Robyne Peers [facility] Follow-up appointment confirmed?: NA Do you need transportation to your follow-up appointment?: No Do you understand care options if your condition(s) worsen?: Yes-patient verbalized understanding    SIGNATURE Karena Addison,  LPN [facility] Nurse Health Advisor Direct Dial [phone]

## 2022-11-28 ENCOUNTER — Encounter: Payer: Self-pay | Admitting: Family Medicine

## 2022-11-28 ENCOUNTER — Ambulatory Visit (INDEPENDENT_AMBULATORY_CARE_PROVIDER_SITE_OTHER): Payer: Medicare Other | Admitting: Family Medicine

## 2022-11-28 VITALS — BP 134/91 | HR 129 | Ht <= 58 in

## 2022-11-28 DIAGNOSIS — I152 Hypertension secondary to endocrine disorders: Secondary | ICD-10-CM

## 2022-11-28 DIAGNOSIS — M25512 Pain in left shoulder: Secondary | ICD-10-CM

## 2022-11-28 DIAGNOSIS — K920 Hematemesis: Secondary | ICD-10-CM

## 2022-11-28 DIAGNOSIS — E1159 Type 2 diabetes mellitus with other circulatory complications: Secondary | ICD-10-CM | POA: Diagnosis not present

## 2022-11-28 NOTE — Patient Instructions (Addendum)
It was great seeing you today!  Today we discussed many things. The shoulder orthopedist should contact you soon regarding your shoulder.   Your blood pressure looks good, please continue all your medications.   Please check your blood sugars 4 times a day and record them as we discussed. Bring this into all your future visits.   In about a week when your oxycodone has finished, I will send you a refill for only 1 tablet a day but we will plan to titrate down soon.   Please follow up with Dr. Raymondo Band on 6/6 at 10 am and with me in 1 month.   Please follow up at your next scheduled appointment, if anything arises between now and then, please don't hesitate to contact our office.   Thank you for allowing [place] to be a part of your medical care!  Thank you, Dr. Robyne Peers

## 2022-11-28 NOTE — Assessment & Plan Note (Signed)
-  BP appropriate, continue current antihypertensive regimen -most recent A1c 10.8. continue glipizide and jardiance -advised to maintain record of glucose levels and to check 4 times a day so we can see if and how to adjust insulin -qualifies for liberate study, scheduled follow up with Dr. Raymondo Band -repeat A1c in 1-2 months

## 2022-11-28 NOTE — Progress Notes (Signed)
    SUBJECTIVE:   CHIEF COMPLAINT / HPI:   Patient presents for follow up accompanied by her daughter. Recently had a hospitalization after she experienced coffee ground emesis. Doing well and denies any further episodes.  Was experiencing left shoulder pain, referred to the shoulder orthopedist after imaging returned with subluxation. She is waiting to hear back from them.  History of chronic pain syndrome, her pain predominently is along her back. Was on morphine during a recent hospitalization and then discharged on oxycodone. She is running out of this in about a week and needs more medication for pain relief.   History of DM. Last A1c is 10.8 about 2 months ago. Was initially on an insulin regimen and instructed to stop insulin at discharge since her sugars where good. Not checking sugars at home. Compliant on oral regimen of glipizide and jardiance daily.   OBJECTIVE:   BP (!) 134/91   Pulse (!) 129   Ht 4\' 9"  (1.448 m)   SpO2 100%   BMI 29.20 kg/m   General: Patient well-appearing, in no acute distress. CV: RRR, no murmurs or gallops auscultated Resp: CTAB, no wheezing, rales or rhonchi  Ext: no LE edema noted bilateraly Neuro: utilizes wheelchair for assistance with ambulation   ASSESSMENT/PLAN:   Hypertension associated with diabetes (HCC) -BP appropriate, continue current antihypertensive regimen -most recent A1c 10.8. continue glipizide and jardiance -advised to maintain record of glucose levels and to check 4 times a day so we can see if and how to adjust insulin -qualifies for liberate study, scheduled follow up with Dr. Raymondo Band -repeat A1c in 1-2 months  Left shoulder pain -recent x-ray notable for subluxation, prior provider referred to shoulder orthopedist. Awaiting to hear from them  Chronic pain -history of chronic pain syndrome, I discussed that I want to treat her pain but at the same time I do not want to cause her more harm than benefit which continuing  the oxycodone long-term may do. We discussed this extensively, I agreed to refill this for another month. She must follow up with me in 1 month and we will discuss slowly tapering down. Both patient and daughter agree with plan.  Hospital follow up -no further symptoms since discharge home -pending CBC to monitor Hgb   -Med rec reviewed.   Reece Leader, DO [facility]

## 2022-11-28 NOTE — Assessment & Plan Note (Signed)
-  recent x-ray notable for subluxation, prior provider referred to shoulder orthopedist. Awaiting to hear from them

## 2022-11-28 NOTE — Assessment & Plan Note (Signed)
-  history of chronic pain syndrome, I discussed that I want to treat her pain but at the same time I do not want to cause her more harm than benefit which continuing the oxycodone long-term may do. We discussed this extensively, I agreed to refill this for another month. She must follow up with me in 1 month and we will discuss slowly tapering down. Both patient and daughter agree with plan.

## 2022-11-29 ENCOUNTER — Other Ambulatory Visit (HOSPITAL_COMMUNITY): Payer: Self-pay

## 2022-11-29 LAB — CBC
Hematocrit: 41.8 % (ref 34.0–46.6)
Hemoglobin: 12.1 g/dL (ref 11.1–15.9)
MCH: 22 pg — ABNORMAL LOW (ref 26.6–33.0)
MCHC: 28.9 g/dL — ABNORMAL LOW (ref 31.5–35.7)
MCV: 76 fL — ABNORMAL LOW (ref 79–97)
Platelets: 335 10*3/uL (ref 150–450)
RBC: 5.51 x10E6/uL — ABNORMAL HIGH (ref 3.77–5.28)
RDW: 19.9 % — ABNORMAL HIGH (ref 11.7–15.4)
WBC: 8.5 10*3/uL (ref 3.4–10.8)

## 2022-12-06 ENCOUNTER — Other Ambulatory Visit: Payer: Self-pay | Admitting: Student

## 2022-12-06 ENCOUNTER — Ambulatory Visit (HOSPITAL_COMMUNITY): Payer: Medicare Other | Attending: Internal Medicine

## 2022-12-06 ENCOUNTER — Other Ambulatory Visit (HOSPITAL_COMMUNITY): Payer: Self-pay

## 2022-12-06 ENCOUNTER — Other Ambulatory Visit: Payer: Self-pay | Admitting: Physical Medicine and Rehabilitation

## 2022-12-06 ENCOUNTER — Encounter (HOSPITAL_COMMUNITY): Payer: Self-pay | Admitting: Internal Medicine

## 2022-12-06 DIAGNOSIS — G894 Chronic pain syndrome: Secondary | ICD-10-CM

## 2022-12-06 DIAGNOSIS — F5101 Primary insomnia: Secondary | ICD-10-CM

## 2022-12-06 MED ORDER — GABAPENTIN 100 MG PO CAPS
100.0000 mg | ORAL_CAPSULE | Freq: Every day | ORAL | 0 refills | Status: DC
Start: 2022-12-06 — End: 2022-12-29
  Filled 2022-12-06: qty 30, 30d supply, fill #0

## 2022-12-07 ENCOUNTER — Other Ambulatory Visit: Payer: Self-pay

## 2022-12-07 ENCOUNTER — Other Ambulatory Visit (HOSPITAL_COMMUNITY): Payer: Self-pay

## 2022-12-07 MED ORDER — EMPAGLIFLOZIN 25 MG PO TABS
25.0000 mg | ORAL_TABLET | Freq: Every day | ORAL | 0 refills | Status: DC
  Filled 2022-12-07: qty 30, 30d supply, fill #0

## 2022-12-07 MED ORDER — GLIPIZIDE 5 MG PO TABS
5.0000 mg | ORAL_TABLET | Freq: Every day | ORAL | 0 refills | Status: DC
  Filled 2022-12-07: qty 30, 30d supply, fill #0

## 2022-12-07 MED ORDER — AMANTADINE HCL 100 MG PO CAPS
200.0000 mg | ORAL_CAPSULE | Freq: Every day | ORAL | 0 refills | Status: DC
  Filled 2022-12-07: qty 30, 15d supply, fill #0

## 2022-12-07 MED ORDER — METHOCARBAMOL 500 MG PO TABS
500.0000 mg | ORAL_TABLET | Freq: Four times a day (QID) | ORAL | 0 refills | Status: DC | PRN
  Filled 2022-12-07: qty 60, 15d supply, fill #0

## 2022-12-07 MED ORDER — PANTOPRAZOLE SODIUM 40 MG PO TBEC
40.0000 mg | DELAYED_RELEASE_TABLET | Freq: Every day | ORAL | 0 refills | Status: DC
  Filled 2022-12-07: qty 30, 30d supply, fill #0

## 2022-12-07 MED ORDER — APIXABAN 5 MG PO TABS
5.0000 mg | ORAL_TABLET | Freq: Two times a day (BID) | ORAL | 0 refills | Status: DC
  Filled 2022-12-07: qty 60, 30d supply, fill #0

## 2022-12-07 MED ORDER — HYDROXYZINE HCL 10 MG PO TABS
10.0000 mg | ORAL_TABLET | Freq: Three times a day (TID) | ORAL | 0 refills | Status: DC | PRN
Start: 2022-12-07 — End: 2023-02-21
  Filled 2022-12-07: qty 30, 10d supply, fill #0

## 2022-12-07 MED ORDER — MEGESTROL ACETATE 400 MG/10ML PO SUSP
400.0000 mg | Freq: Two times a day (BID) | ORAL | 0 refills | Status: DC
  Filled 2022-12-07: qty 480, 24d supply, fill #0

## 2022-12-07 MED ORDER — VITAMIN D (ERGOCALCIFEROL) 1.25 MG (50000 UNIT) PO CAPS
50000.0000 [IU] | ORAL_CAPSULE | ORAL | 0 refills | Status: DC
  Filled 2022-12-07: qty 5, 35d supply, fill #0

## 2022-12-07 MED ORDER — LOSARTAN POTASSIUM 50 MG PO TABS
50.0000 mg | ORAL_TABLET | Freq: Every day | ORAL | 0 refills | Status: DC
  Filled 2022-12-07: qty 30, 30d supply, fill #0

## 2022-12-07 MED ORDER — METOPROLOL TARTRATE 25 MG PO TABS
25.0000 mg | ORAL_TABLET | Freq: Two times a day (BID) | ORAL | 0 refills | Status: DC
  Filled 2022-12-07: qty 60, 30d supply, fill #0

## 2022-12-07 MED ORDER — TORSEMIDE 20 MG PO TABS
20.0000 mg | ORAL_TABLET | Freq: Every day | ORAL | 0 refills | Status: DC
  Filled 2022-12-07: qty 30, 30d supply, fill #0

## 2022-12-07 MED ORDER — TRAZODONE HCL 50 MG PO TABS
25.0000 mg | ORAL_TABLET | Freq: Every day | ORAL | 0 refills | Status: DC
  Filled 2022-12-07 – 2022-12-26 (×4): qty 30, 60d supply, fill #0

## 2022-12-07 MED ORDER — SPIRONOLACTONE 25 MG PO TABS
25.0000 mg | ORAL_TABLET | Freq: Every day | ORAL | 0 refills | Status: DC
  Filled 2022-12-07: qty 30, 30d supply, fill #0

## 2022-12-07 MED ORDER — ESCITALOPRAM OXALATE 10 MG PO TABS
10.0000 mg | ORAL_TABLET | Freq: Every day | ORAL | 0 refills | Status: DC
  Filled 2022-12-07: qty 30, 30d supply, fill #0

## 2022-12-08 ENCOUNTER — Telehealth: Payer: Self-pay

## 2022-12-08 ENCOUNTER — Other Ambulatory Visit (HOSPITAL_COMMUNITY): Payer: Self-pay

## 2022-12-08 ENCOUNTER — Other Ambulatory Visit: Payer: Self-pay

## 2022-12-08 NOTE — Telephone Encounter (Signed)
Aziz Slape the Production manager called to report Gustavo Lah missed  the home visit, for therapy.    She was also a No Show here for the hospital follow up. Akoni Parton OT will inform S[place] of the missed appointment. Also the need for visits  here at [facility] for continued home therapy.Marland Kitchen

## 2022-12-13 ENCOUNTER — Telehealth: Payer: Self-pay

## 2022-12-13 NOTE — Telephone Encounter (Signed)
Sree from [facility] calling for PT verbal orders as follows:  1 time(s) weekly for 1 week(s), then 0 time(s) weekly for 1 week(s), then 1 time weekly for 1 week, then 2 times weekly for two weeks, then 1 time weekly for two weeks.   Verbal orders given per [facility] protocol.   Also requesting orders for a hemi walker. Please place DME order and route back to RN team for processing.   Veronda Prude, RN

## 2022-12-14 ENCOUNTER — Other Ambulatory Visit (HOSPITAL_COMMUNITY): Payer: Self-pay

## 2022-12-14 ENCOUNTER — Other Ambulatory Visit: Payer: Self-pay | Admitting: Family Medicine

## 2022-12-14 DIAGNOSIS — I63411 Cerebral infarction due to embolism of right middle cerebral artery: Secondary | ICD-10-CM

## 2022-12-15 ENCOUNTER — Other Ambulatory Visit: Payer: Self-pay

## 2022-12-15 ENCOUNTER — Other Ambulatory Visit (HOSPITAL_COMMUNITY): Payer: Self-pay

## 2022-12-15 NOTE — Telephone Encounter (Signed)
April [facility] RN calls nurse line reporting patient is requesting a refill on Oxycodone.   Will forward to PCP.

## 2022-12-20 ENCOUNTER — Other Ambulatory Visit (HOSPITAL_COMMUNITY): Payer: Self-pay

## 2022-12-21 ENCOUNTER — Other Ambulatory Visit (HOSPITAL_COMMUNITY): Payer: Self-pay

## 2022-12-21 MED ORDER — PREGABALIN 75 MG PO CAPS
75.0000 mg | ORAL_CAPSULE | Freq: Two times a day (BID) | ORAL | 0 refills | Status: DC | PRN
  Filled 2022-12-21: qty 60, 30d supply, fill #0

## 2022-12-23 NOTE — Telephone Encounter (Signed)
Sree returns call to nurse line. Faxed order to [employer].   Veronda Prude, RN

## 2022-12-26 ENCOUNTER — Other Ambulatory Visit (HOSPITAL_COMMUNITY): Payer: Self-pay

## 2022-12-26 ENCOUNTER — Telehealth: Payer: Self-pay

## 2022-12-26 ENCOUNTER — Other Ambulatory Visit: Payer: Self-pay | Admitting: Family Medicine

## 2022-12-26 MED ORDER — ACCU-CHEK GUIDE W/DEVICE KIT: PACK | 0 refills | Status: DC

## 2022-12-26 MED ORDER — ACCU-CHEK SOFTCLIX LANCETS MISC: Freq: Four times a day (QID) | 2 refills | Status: DC

## 2022-12-26 MED ORDER — ACCU-CHEK GUIDE VI STRP: ORAL_STRIP | 12 refills | Status: DC

## 2022-12-26 NOTE — Telephone Encounter (Signed)
Received call from April at [facility] regarding glucometer. She says that insurance is not covering at the Outpatient pharmacy and that this needs to be sent to [facility] pharmacy.   Due to patient having Medicare, attending will need to send in prescription.   Will forward to precepting provider, [place], to resend.   Thanks.   Veronda Prude, RN

## 2022-12-27 ENCOUNTER — Other Ambulatory Visit (HOSPITAL_COMMUNITY): Payer: Self-pay

## 2022-12-29 ENCOUNTER — Encounter: Payer: Self-pay | Admitting: Pharmacist

## 2022-12-29 ENCOUNTER — Ambulatory Visit (INDEPENDENT_AMBULATORY_CARE_PROVIDER_SITE_OTHER): Payer: Medicare Other | Admitting: Pharmacist

## 2022-12-29 ENCOUNTER — Other Ambulatory Visit (HOSPITAL_COMMUNITY): Payer: Self-pay

## 2022-12-29 VITALS — BP 136/95 | HR 101 | Ht 59.0 in | Wt 139.8 lb

## 2022-12-29 DIAGNOSIS — G894 Chronic pain syndrome: Secondary | ICD-10-CM | POA: Diagnosis not present

## 2022-12-29 DIAGNOSIS — E1165 Type 2 diabetes mellitus with hyperglycemia: Secondary | ICD-10-CM

## 2022-12-29 LAB — POCT GLYCOSYLATED HEMOGLOBIN (HGB A1C): HbA1c, POC (controlled diabetic range): 7.8 % — AB (ref 0.0–7.0)

## 2022-12-29 MED ORDER — OXYCODONE HCL 5 MG PO TABS
5.0000 mg | ORAL_TABLET | Freq: Every day | ORAL | 0 refills | Status: DC | PRN
Start: 2022-12-29 — End: 2023-04-11
  Filled 2022-12-29: qty 15, 15d supply, fill #0

## 2022-12-29 NOTE — Patient Instructions (Signed)
It was nice to see you today!  Your goal blood sugar is 80-130 before eating and less than 180 after eating.  No medication changes today. Great job on getting your A1c down!! Keep up the good work!  Schedule a visit with pharmacy clinic in 2-3 months  Monitor blood sugars at home and keep a log (glucometer or piece of paper) to bring with you to your next visit.  Keep up the good work with diet and exercise. Aim for a diet full of vegetables, fruit and lean meats (chicken, [place], fish). Try to limit salt intake by eating fresh or frozen vegetables (instead of canned), rinse canned vegetables prior to cooking and do not add any additional salt to meals.

## 2022-12-29 NOTE — Assessment & Plan Note (Signed)
LIBERATE Study:  -Patient provided verbal consent to participate in the study. Consent documented in electronic medical record. However, A1c <8% and patient is not able to enroll.   Diabetes longstanding, currently close to goal based on A1c (7.8%). Much improved from last A1c of 10.8%. Patient is able to verbalize appropriate hypoglycemia management plan. Medication adherence appears appropriate. Nausea/vomiting prohibits the use of GLP1.  -Continued SGLT2-I Jardiance (empagliflozin) 25 mg daily. Counseled on sick day rules. -Continued glipizide 5 mg daily -May consider the addition of DPP4 inhibitor at follow up given it may not be possible to start GLP1 due to nausea.  -Patient educated on purpose, proper use, and potential adverse effects of Jardiance, glipizide.  -Extensively discussed pathophysiology of diabetes, recommended lifestyle interventions, dietary effects on blood sugar control.  -Counseled on s/sx of and management of hypoglycemia.

## 2022-12-29 NOTE — Progress Notes (Signed)
Patient presented for visit in the clinic with Dr. Raymondo Band. Asked by Dr. Raymondo Band to briefly see patient given her pain. Extensive past medical history including chronic pain syndrome. She is in excruciating pain and reports that she needs her oxycodone 5 mg that she once takes once daily at night sometimes. Denies any other pain medication, without it she is not able to withstand the pain and ends up taking goody powders which I recommended against and she is aware. We discussed that each time this is refilled, she will need to schedule a visit in the clinic prior to the refill being approved to ensure that she is on the minimum dose needed. I shared with her that I am hesitant to continue this medication long-term given her extensive cardiac history and the risks associated with this. I feel that if she may need to continue oxycodone then she will need further pain management, referral placed to the pain clinic. I explained to her that I will provide her with 1 more refill and then she will need to return to discuss continuing to taper this medication down. PDMP reviewed, 15 tablets lasted more than a month with last prescription 4/30. Pain significantly better controlled with 1 tablet of oxycodone 5 mg. The goal is to have her continue to taper down and be managed by the pain clinic for any further changes. Patient is aware of this plan. Discussed with attending, Dr. Miquel Dunn.

## 2022-12-29 NOTE — Progress Notes (Signed)
S:    Chief Complaint  Patient presents with   Research    LIBERATE Initial Visit Evaluation   47 y.o. female who presents for diabetes evaluation, education, and management in the context of the LIBERATE Study.   PMH is significant for DM, HFrEF, history of CVA, PCOS, and HLD.  Patient was referred and last seen by Primary Care Provider, Dr. Robyne Peers, on 11/28/2022.   At last visit, no diabetes medication changes were made.  Today, patient arrives in fair spirits and presents with the assistance of a wheelchair and left arm sling. She is accompanied by her dad, Bernette Redbird. She complains today of pain.   Current diabetes medications include: Jardiance (empagliflozin) 25 mg daily, glipizide 5 mg daily,  Current hypertension medications include: losartan 50 mg daily, spironolactone 25 mg daily, torsemide 20 mg daily, metoprolol tartrate 25 mg BID Current hyperlipidemia medications include: atorvastatin 80 mg daily  Patient reports adherence to taking all medications as prescribed.   Insurance coverage: Medicare + Medicaid  Patient denies hypoglycemic events.  Patient denies nocturia (nighttime urination).  Patient reports neuropathy (nerve pain). Patient denies visual changes. Patient reports self foot exams.   Patient-reported exercise habits: limited by mobility   O:  Review of Systems  Gastrointestinal:  Positive for nausea and vomiting.  Musculoskeletal:  Positive for joint pain.  All other systems reviewed and are negative.   Physical Exam Constitutional:      Appearance: Normal appearance.  Pulmonary:     Effort: Pulmonary effort is normal.  Neurological:     Mental Status: She is alert.  Psychiatric:        Mood and Affect: Mood normal.        Behavior: Behavior normal.        Thought Content: Thought content normal.    Lab Results  Component Value Date   HGBA1C 7.8 (A) 12/29/2022    Vitals:   12/29/22 1044 12/29/22 1048  BP: (!) 137/95 (!) 136/95  Pulse:     SpO2:      Lipid Panel     Component Value Date/Time   CHOL 136 09/24/2022 0130   CHOL 266 (H) 10/20/2017 1054   TRIG 63 09/28/2022 0456   HDL 27 (L) 09/24/2022 0130   HDL 43 10/20/2017 1054   CHOLHDL 5.0 09/24/2022 0130   VLDL 12 09/24/2022 0130   LDLCALC 97 09/24/2022 0130   LDLCALC 195 (H) 10/20/2017 1054   LDLDIRECT 154 (H) 01/20/2014 1522    Clinical Atherosclerotic Cardiovascular Disease (ASCVD): Yes  The ASCVD Risk score (Arnett DK, et al., 2019) failed to calculate for the following reasons:   The patient has a prior MI or stroke diagnosis     A/P: LIBERATE Study:  -Patient provided verbal consent to participate in the study. Consent documented in electronic medical record. However, A1c <8% and patient is not able to enroll.   Diabetes longstanding, currently close to goal based on A1c (7.8%). Much improved from last A1c of 10.8%. Patient is able to verbalize appropriate hypoglycemia management plan. Medication adherence appears appropriate. Nausea/vomiting prohibits the use of GLP1.  -Continued SGLT2-I Jardiance (empagliflozin) 25 mg daily. Counseled on sick day rules. -Continued glipizide 5 mg daily -May consider the addition of DPP4 inhibitor at follow up given it may not be possible to start GLP1 due to nausea.  -Patient educated on purpose, proper use, and potential adverse effects of Jardiance, glipizide.  -Extensively discussed pathophysiology of diabetes, recommended lifestyle interventions, dietary effects on  blood sugar control.  -Counseled on s/sx of and management of hypoglycemia.  -Next A1c anticipated September 2024.   Hypertension longstanding, currently above goal. However, patient has not taken BP medications today.  She is in a significant amount of pain today.  Blood pressure goal of <130/80 mmHg. Medication adherence reported.  -Continue losartan 50 mg daily -Continue metoprolol tartrate 25 mg BID -Continue spironolactone 25 mg daily -Continue  torsemide 25 mg daily  Chronic pain, uncontrolled. Patient request prescription for oxycodone. Dr. Robyne Peers was able to see patient during her visit today. -Referral placed to pain clinic -Dr. Robyne Peers prescribed oxycodone IR 5 mg daily PRN.  Dr. Robyne Peers is also planning to see patient again in a few weeks.  Written patient instructions provided. Patient verbalized understanding of treatment plan.  Total time in face to face counseling 50 minutes.    Follow-up:  Pharmacist 2-3 month. PCP clinic visit on 01/12/2023.  Patient seen with Lily Peer, PharmD, PGY-1 resident and Valeda Malm, PharmD, PGY2 Pharmacy Resident.

## 2023-01-04 ENCOUNTER — Other Ambulatory Visit: Payer: Self-pay | Admitting: Family Medicine

## 2023-01-04 ENCOUNTER — Other Ambulatory Visit: Payer: Self-pay

## 2023-01-04 DIAGNOSIS — E1165 Type 2 diabetes mellitus with hyperglycemia: Secondary | ICD-10-CM

## 2023-01-04 NOTE — Patient Outreach (Signed)
Telephone outreach to patient to obtain mRS was successfully completed. MRS= 3  Lezly Rumpf [facility] Care Management Assistant [phone]  

## 2023-01-12 ENCOUNTER — Other Ambulatory Visit: Payer: Self-pay | Admitting: Family Medicine

## 2023-01-12 ENCOUNTER — Ambulatory Visit: Payer: Medicare Other | Admitting: Family Medicine

## 2023-01-12 DIAGNOSIS — E1165 Type 2 diabetes mellitus with hyperglycemia: Secondary | ICD-10-CM

## 2023-01-12 MED ORDER — ACCU-CHEK GUIDE W/DEVICE KIT: PACK | 0 refills | Status: DC

## 2023-01-12 MED ORDER — ACCU-CHEK SOFTCLIX LANCETS MISC
2 refills | Status: AC
Start: 2023-01-12 — End: ?

## 2023-01-12 NOTE — Addendum Note (Signed)
Addended by: Pearlean Brownie L on: 01/12/2023 02:38 PM   Modules accepted: Orders

## 2023-01-14 ENCOUNTER — Emergency Department (HOSPITAL_COMMUNITY)
Admission: EM | Admit: 2023-01-14 | Discharge: 2023-01-15 | Disposition: A | Discharge status: Home / Self Care | Payer: Medicare Other | Attending: Emergency Medicine | Admitting: Emergency Medicine

## 2023-01-14 ENCOUNTER — Other Ambulatory Visit: Payer: Self-pay

## 2023-01-14 ENCOUNTER — Encounter (HOSPITAL_COMMUNITY): Payer: Self-pay

## 2023-01-14 ENCOUNTER — Emergency Department (HOSPITAL_COMMUNITY): Payer: Medicare Other

## 2023-01-14 DIAGNOSIS — J45909 Unspecified asthma, uncomplicated: Secondary | ICD-10-CM | POA: Insufficient documentation

## 2023-01-14 DIAGNOSIS — E119 Type 2 diabetes mellitus without complications: Secondary | ICD-10-CM | POA: Insufficient documentation

## 2023-01-14 DIAGNOSIS — I110 Hypertensive heart disease with heart failure: Secondary | ICD-10-CM | POA: Insufficient documentation

## 2023-01-14 DIAGNOSIS — D72829 Elevated white blood cell count, unspecified: Secondary | ICD-10-CM | POA: Diagnosis not present

## 2023-01-14 DIAGNOSIS — I509 Heart failure, unspecified: Secondary | ICD-10-CM | POA: Insufficient documentation

## 2023-01-14 DIAGNOSIS — R519 Headache, unspecified: Secondary | ICD-10-CM | POA: Diagnosis present

## 2023-01-14 DIAGNOSIS — G43709 Chronic migraine without aura, not intractable, without status migrainosus: Secondary | ICD-10-CM

## 2023-01-14 DIAGNOSIS — R0789 Other chest pain: Secondary | ICD-10-CM

## 2023-01-14 LAB — BASIC METABOLIC PANEL
Anion gap: 13 (ref 5–15)
BUN: 14 mg/dL (ref 6–20)
CO2: 18 mmol/L — ABNORMAL LOW (ref 22–32)
Calcium: 9.4 mg/dL (ref 8.9–10.3)
Chloride: 105 mmol/L (ref 98–111)
Creatinine, Ser: 0.76 mg/dL (ref 0.44–1.00)
GFR, Estimated: >60 mL/min (ref >60)
Glucose, Bld: 166 mg/dL — ABNORMAL HIGH (ref 70–99)
Potassium: 4 mmol/L (ref 3.5–5.1)
Sodium: 136 mmol/L (ref 135–145)

## 2023-01-14 LAB — CBC
HCT: 37.6 % (ref 36.0–46.0)
Hemoglobin: 11.7 g/dL — ABNORMAL LOW (ref 12.0–15.0)
MCH: 26 pg (ref 26.0–34.0)
MCHC: 31.1 g/dL (ref 30.0–36.0)
MCV: 83.6 fL (ref 80.0–100.0)
Platelets: 307 10*3/uL (ref 150–400)
RBC: 4.5 MIL/uL (ref 3.87–5.11)
RDW: 22.5 % — ABNORMAL HIGH (ref 11.5–15.5)
WBC: 11.5 10*3/uL — ABNORMAL HIGH (ref 4.0–10.5)
nRBC: 0.2 % (ref 0.0–0.2)

## 2023-01-14 LAB — I-STAT BETA HCG BLOOD, ED (MC, WL, AP ONLY): I-stat hCG, quantitative: <5 m[IU]/mL (ref <5)

## 2023-01-14 LAB — TROPONIN I (HIGH SENSITIVITY)
Troponin I (High Sensitivity): 20 ng/L — ABNORMAL HIGH (ref <18)
Troponin I (High Sensitivity): 17 ng/L (ref <18)

## 2023-01-14 MED ORDER — PROCHLORPERAZINE EDISYLATE 10 MG/2ML IJ SOLN
10.0000 mg | Freq: Once | INTRAMUSCULAR | Status: AC
  Administered 2023-01-14: 10 mg via INTRAVENOUS
  Filled 2023-01-14: qty 2

## 2023-01-14 MED ORDER — DIPHENHYDRAMINE HCL 50 MG/ML IJ SOLN
12.5000 mg | Freq: Once | INTRAMUSCULAR | Status: AC
  Administered 2023-01-14: 12.5 mg via INTRAVENOUS
  Filled 2023-01-14: qty 1

## 2023-01-14 MED ORDER — OXYCODONE-ACETAMINOPHEN 5-325 MG PO TABS
1.0000 | ORAL_TABLET | Freq: Once | ORAL | Status: AC
  Administered 2023-01-14: 1 via ORAL
  Filled 2023-01-14: qty 1

## 2023-01-14 NOTE — ED Triage Notes (Signed)
Pt complaining of nausea, vomiting, and chest pain that started last night. Today it is steadily getting worse and she is starting to have a headache and shortness of breath.

## 2023-01-14 NOTE — ED Notes (Addendum)
Patient transported to X-ray 

## 2023-01-14 NOTE — ED Provider Notes (Signed)
[place] [facility] DEPARTMENT AT [facility] Provider Note  CSN: [id number] Arrival date & time: 01/14/23 1946  Chief Complaint(s) Chest Pain  HPI [name] is a 47 y.o. 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  Patient reports chest pain since yesterday.  Reports it is worse with pressing on her chest.  Denies shortness of breath to me.  She also reports headache consistent with previous migraines.  Denies any head injury or falls.  She reports associated visual aura which she gets with her normal migraines.  She has chronic left arm and left leg weakness and numbness from previous stroke which has been improving, no new numbness or tingling, weakness, facial droop, trouble swallowing, loss of vision.   Past Medical History Past Medical History:  Diagnosis Date   Abnormal Pap smear    Acute combined systolic and diastolic heart failure (HCC) 02/19/2019   Wt Readings from Last 3 Encounters:  09/18/21  84.7 kg  09/02/21  84.4 kg  08/31/21  84.2 kg         Acute heart failure (HCC) 01/29/2022   Acute on chronic combined systolic and diastolic CHF (congestive heart failure) (HCC) 02/19/2019   Acute on chronic congestive heart failure (HCC)    Acute on chronic congestive heart failure (HCC)    Acute on chronic diastolic CHF (congestive heart failure) (HCC) 10/19/2021   Acute on chronic systolic heart failure (HCC) 02/22/2022   Anemia    Asthma    Atrial fibrillation (HCC)    history of paroxysmal atrial fibrillation   CHF (congestive heart failure) (HCC) 11/02/2021   CHF exacerbation (HCC) 10/17/2021   Diabetes mellitus    DKA, type 2 (HCC) 02/06/2019   Elevated troponin 09/30/2019   Heart failure with reduced ejection fraction (HCC)    History of Left ventricular thrombus 09/22/2021   Hydrosalpinx 08/21/2009   Dx on Pelvic US in Jan  2011.  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     Migraines    Morbid obesity (HCC) 02/06/2019   Nausea & vomiting 06/26/2022   Nonischemic cardiomyopathy (HCC) 09/22/2021   NSTEMI (non-ST elevated myocardial infarction) (HCC) 02/23/2022   Obstructive sleep apnea 06/30/2015   Polycystic ovarian disease    Patient Active Problem List   Diagnosis Date Noted   Left shoulder pain 11/28/2022   Coffee ground emesis 11/18/2022   History of right MCA stroke 11/18/2022   Gaze preference w/left inattention 10/04/2022   Left hemiplegia (HCC) 10/04/2022   Mild episode of recurrent major depressive disorder (HCC) 10/04/2022   Insomnia 10/02/2022   Acute ischemic right MCA stroke (HCC) 10/01/2022   Acute respiratory failure with hypoxia (HCC) 09/27/2022   Cerebrovascular accident (CVA) due to embolism of right middle cerebral artery (HCC) 09/27/2022   Middle cerebral artery embolism, right 09/24/2022   Acute on chronic combined systolic (congestive) and diastolic (congestive) heart failure (HCC) 09/23/2022   Anasarca 09/23/2022   CHF exacerbation (HCC) 08/05/2022   Nausea and vomiting 06/25/2022   Mild protein malnutrition (HCC) 06/25/2022   Class 2 obesity 06/25/2022   Hyperbilirubinemia 06/25/2022   Chronic pain 05/09/2022  Dizziness 11/02/2021   Depression, recurrent (HCC)    Nonischemic cardiomyopathy (HCC) 09/22/2021   Nonobstructive atherosclerosis of coronary artery 09/22/2021   LV (left ventricular) mural thrombus 09/22/2021   Hypokalemia 10/13/2019   Elevated troponin 09/30/2019   Chronic chest wall pain 03/27/2019   Hyperglycemia    Biventricular failure (HCC) 02/06/2019   Chronic combined systolic and diastolic heart  failure (HCC) 69/62/9528   Paroxysmal atrial fibrillation (HCC) 12/31/2018   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/31/2018   Chronic nausea 12/27/2018   Hyperlipidemia associated with type 2 diabetes mellitus (HCC) 10/30/2017   Obstructive sleep apnea 06/30/2015   Shortness of breath 06/29/2015   Anemia of chronic disease 07/22/2013   Allergic rhinitis 06/27/2012   Hypertension associated with diabetes (HCC) 12/04/2011   Poorly controlled type 2 diabetes mellitus (HCC) 05/19/2011   PCOS (polycystic ovarian syndrome) 05/19/2011   Home Medication(s) Prior to Admission medications   Medication Sig Start Date End Date Taking? Authorizing Provider  Accu-Chek Softclix Lancets lancets USE 1  TO CHECK GLUCOSE 4 TIMES DAILY 01/12/23   Carney Living, MD  acetaminophen (TYLENOL) 500 MG tablet Take 500-1,000 mg by mouth every 6 (six) hours as needed for mild pain or headache.    [provider]  amantadine (SYMMETREL) 100 MG capsule Take 2 capsules (200 mg total) by mouth daily. 12/07/22   Ganta, Anupa, DO  apixaban (ELIQUIS) 5 MG TABS tablet Take 1 tablet (5 mg total) by mouth 2 (two) times daily. 12/07/22   Ganta, Anupa, DO  atorvastatin (LIPITOR) 80 MG tablet Take 1 tablet (80 mg total) by mouth daily after supper. 10/31/22   Love, Evlyn Kanner, PA-C  blood glucose meter kit and supplies Use up to 4 (four) times daily as directed 06/06/22   [place], Alvira Philips, DO  Blood Glucose Monitoring Suppl (ACCU-CHEK GUIDE) w/Device KIT USE 1 UP TO 4 TIMES DAILY 01/12/23   Carney Living, MD  empagliflozin (JARDIANCE) 25 MG TABS tablet Take 1 tablet (25 mg total) by mouth daily. 12/07/22   Ganta, Anupa, DO  escitalopram (LEXAPRO) 10 MG tablet Take 1 tablet (10 mg total) by mouth at bedtime. 12/07/22   Ganta, Anupa, DO  glipiZIDE (GLUCOTROL) 5 MG tablet Take 1 tablet (5 mg total) by mouth daily before breakfast. 12/07/22   Ganta, Anupa, DO  glucose blood (ACCU-CHEK GUIDE) test strip USE  4  TIMES DAILY 01/12/23   Ganta, Anupa, DO  hydrOXYzine (ATARAX) 10 MG tablet Take 1 tablet (10 mg total) by mouth 3 (three) times daily as needed. 12/07/22   Ganta, Anupa, DO  losartan (COZAAR) 50 MG tablet Take 1 tablet (50 mg total) by mouth daily. 12/07/22   Ganta, Anupa, DO  megestrol (MEGACE) 400 MG/10ML suspension Take 10 mLs (400 mg total) by mouth 2 (two) times daily. Patient not taking: Reported on 12/29/2022 12/07/22   Ganta, Anupa, DO  melatonin 5 MG TABS Take 1 tablet (5 mg total) by mouth at bedtime. 10/31/22   Love, Evlyn Kanner, PA-C  methocarbamol (ROBAXIN) 500 MG tablet Take 1 tablet (500 mg total) by mouth every 6 (six) hours as needed for muscle spasms. Patient taking differently: Take 500 mg by mouth daily. 12/07/22   Ganta, Anupa, DO  metoprolol tartrate (LOPRESSOR) 25 MG tablet Take 1 tablet (25 mg total) by mouth 2 (two) times daily. 12/07/22   Ganta, Anupa, DO  oxyCODONE (OXY IR/ROXICODONE) 5 MG immediate release tablet Take 1 tablet (5 mg total) by mouth  daily as needed for severe pain. 12/29/22   Ganta, Anupa, DO  pregabalin (LYRICA) 75 MG capsule Take 1 capsule (75 mg total) by mouth 2 (two) times daily as needed for pain 12/21/22     senna-docusate (SENOKOT-S) 8.6-50 MG tablet Take 3 tablets by mouth daily with lunch. Patient taking differently: Take 3 tablets by mouth as needed for mild constipation. 10/31/22   Love, Evlyn Kanner, PA-C  spironolactone (ALDACTONE) 25 MG tablet Take 1 tablet (25 mg total) by mouth daily. 12/07/22   Ganta, Anupa, DO  torsemide (DEMADEX) 20 MG tablet Take 1 tablet (20 mg total) by mouth daily. 12/07/22   Ganta, Anupa, DO  traZODone (DESYREL) 50 MG tablet Take 1/2 tablet (25 mg total) by mouth at bedtime. 12/07/22   Reece Leader, DO  Vitamin D, Ergocalciferol, (DRISDOL) 1.25 MG (50000 UNIT) CAPS capsule Take 1 capsule (50,000 Units total) by mouth every 7 (seven) days. 12/07/22   Reece Leader, DO  carvedilol (COREG) 3.125 MG tablet Take 1 tablet (3.125 mg total) by mouth  2 (two) times daily with a meal. 02/27/22 07/01/22  Meredeth Ide, MD                                                                                                                                    Past Surgical History Past Surgical History:  Procedure Laterality Date   BIOPSY  11/05/2021   Procedure: BIOPSY;  Surgeon: Jenel Lucks, MD;  Location: [facility] ENDOSCOPY;  Service: Gastroenterology;;   CERVIX LESION DESTRUCTION     CESAREAN SECTION     ESOPHAGOGASTRODUODENOSCOPY (EGD) WITH PROPOFOL N/A 11/05/2021   Procedure: ESOPHAGOGASTRODUODENOSCOPY (EGD) WITH PROPOFOL;  Surgeon: Jenel Lucks, MD;  Location: [facility] ENDOSCOPY;  Service: Gastroenterology;  Laterality: N/A;   IR CT HEAD LTD  09/24/2022   IR PERCUTANEOUS ART THROMBECTOMY/INFUSION INTRACRANIAL INC DIAG ANGIO  09/24/2022   RADIOLOGY WITH ANESTHESIA N/A 09/24/2022   Procedure: IR WITH ANESTHESIA;  Surgeon: Radiologist, Medication, MD;  Location: [facility] OR;  Service: Radiology;  Laterality: N/A;   RIGHT/LEFT HEART CATH AND CORONARY ANGIOGRAPHY N/A 03/29/2019   Procedure: RIGHT/LEFT HEART CATH AND CORONARY ANGIOGRAPHY;  Surgeon: Laurey Morale, MD;  Location: [facility] INVASIVE CV LAB;  Service: Cardiovascular;  Laterality: N/A;   RIGHT/LEFT HEART CATH AND CORONARY ANGIOGRAPHY N/A 09/20/2021   Procedure: RIGHT/LEFT HEART CATH AND CORONARY ANGIOGRAPHY;  Surgeon: Laurey Morale, MD;  Location: [facility] INVASIVE CV LAB;  Service: Cardiovascular;  Laterality: N/A;   Family History Family History  Problem Relation Age of Onset   Diabetes Mother    Hypertension Mother    Hypertension Father    Diabetes Father     Social History Social History   Tobacco Use   Smoking status: Never   Smokeless tobacco: Never   Tobacco comments:    Never smoke 10/22/21  Vaping Use   Vaping Use: Never used  Substance Use Topics  Alcohol use: No    Alcohol/week: 0.0 standard drinks of alcohol   Drug use: No   Allergies Fish allergy, Shellfish allergy, Metformin  and related, Nsaids, and Trulicity [dulaglutide]  Review of Systems Review of Systems  All other systems reviewed and are negative.   Physical Exam Vital Signs  I have reviewed the triage vital signs BP (!) 128/103   Pulse 93   Temp 97.8 F (36.6 C) (Oral)   Resp 20   Ht 4\' 11"  (1.499 m)   Wt 63 kg   SpO2 100%   BMI 28.07 kg/m  Physical Exam Vitals and nursing note reviewed.  Constitutional:      General: She is not in acute distress.    Appearance: She is well-developed.  HENT:     Head: Normocephalic and atraumatic.     Mouth/Throat:     Mouth: Mucous membranes are moist.  Eyes:     Pupils: Pupils are equal, round, and reactive to light.  Cardiovascular:     Rate and Rhythm: Normal rate and regular rhythm.     Heart sounds: No murmur heard. Pulmonary:     Effort: Pulmonary effort is normal. No respiratory distress.     Breath sounds: Normal breath sounds.  Chest:     Chest wall: Tenderness (tenderness over anterior chest reproduces her pain) present.  Abdominal:     General: Abdomen is flat.     Palpations: Abdomen is soft.     Tenderness: There is no abdominal tenderness.  Musculoskeletal:        General: No tenderness.     Right lower leg: Edema present.     Left lower leg: Edema present.     Comments: Trace b/l edema  Skin:    General: Skin is warm and dry.  Neurological:     Mental Status: She is alert. Mental status is at baseline.     Comments: Cranial nerves II through XII intact, chronic 2-5 strength in the left upper and 4-5 strength in the left lower extremity which patient reports is at baseline.  5 out of 5 strength in the right upper and right lower extremity.  No sensory deficit to light touch.  Psychiatric:        Mood and Affect: Mood normal.        Behavior: Behavior normal.     ED Results and Treatments Labs (all labs ordered are listed, but only abnormal results are displayed) Labs Reviewed  BASIC METABOLIC PANEL - Abnormal; Notable  for the following components:      Result Value   CO2 18 (*)    Glucose, Bld 166 (*)    All other components within normal limits  CBC - Abnormal; Notable for the following components:   WBC 11.5 (*)    Hemoglobin 11.7 (*)    RDW 22.5 (*)    All other components within normal limits  TROPONIN I (HIGH SENSITIVITY) - Abnormal; Notable for the following components:   Troponin I (High Sensitivity) 20 (*)    All other components within normal limits  I-STAT BETA HCG BLOOD, ED ([facility], [facility], [facility] ONLY)  TROPONIN I (HIGH SENSITIVITY)  Radiology DG Chest 2 View  Result Date: 01/14/2023 CLINICAL DATA:  Nausea and vomiting, chest pain EXAM: CHEST - 2 VIEW COMPARISON:  11/13/2022 FINDINGS: Frontal and lateral views of the chest demonstrate an enlarged cardiac silhouette. No airspace disease, effusion, or pneumothorax. No acute bony abnormalities. IMPRESSION: 1. Enlarged cardiac silhouette. 2. No acute airspace disease. Electronically Signed   By: Sharlet Salina M.D.   On: 01/14/2023 21:24    Pertinent labs & imaging results that were available during my care of the patient were reviewed by me and considered in my medical decision making (see MDM for details).  Medications Ordered in ED Medications  prochlorperazine (COMPAZINE) injection 10 mg (10 mg Intravenous Given 01/14/23 2331)  oxyCODONE-acetaminophen (PERCOCET/ROXICET) 5-325 MG per tablet 1 tablet (1 tablet Oral Given 01/14/23 2314)  diphenhydrAMINE (BENADRYL) injection 12.5 mg (12.5 mg Intravenous Given 01/14/23 2326)                                                                                                                                     Procedures Procedures  (including critical care time)  Medical Decision Making / ED Course   MDM:  47 year old presenting with headache, chest pain.  Patient overall  well-appearing, vital signs reassuring.  Neurologic exam seems at baseline.  Exam also with reproducible chest tenderness.  Suspect musculoskeletal chest pain, chronic migraine headache.  Will treat with pain medication, migraine cocktail and reassess.  Chest x-ray without evidence of acute cardiac process.  Patient does not appear volume overloaded on physical examination.  History atypical for ACS.  Doubt pulmonary embolism as the patient is compliant with her Eliquis and pain is not pleuritic.  Chest x-ray without evidence of pneumonia or pneumothorax.  Patient also reports some chronic left shoulder pain, has known left shoulder subluxation which is chronic, chest x-ray on my interpretation shows stable subluxation.  Patient also complains of headache, similar to chronic migraine headache.  No trauma to suggest intracranial process such as bleeding.  Neurologic exam seems stable.  She reports she has been under increased stress recently.  Will treat with migraine cocktail and reassess.  Signed out to oncoming provider Dr. Clayborne Dana pending reassessment after migraine cocktail, if migraine improved, likely discharge.      Additional history obtained: -Additional history obtained from family -External records from outside source obtained and reviewed including: Chart review including previous notes, labs, imaging, consultation notes including prior family medicine notes, hospitalization notes   Lab Tests: -I ordered, reviewed, and interpreted labs.   The pertinent results include:   Labs Reviewed  BASIC METABOLIC PANEL - Abnormal; Notable for the following components:      Result Value   CO2 18 (*)    Glucose, Bld 166 (*)    All other components within normal limits  CBC - Abnormal; Notable for the following components:   WBC 11.5 (*)    Hemoglobin 11.7 (*)  RDW 22.5 (*)    All other components within normal limits  TROPONIN I (HIGH SENSITIVITY) - Abnormal; Notable for the following  components:   Troponin I (High Sensitivity) 20 (*)    All other components within normal limits  I-STAT BETA HCG BLOOD, ED ([facility], [facility], [facility] ONLY)  TROPONIN I (HIGH SENSITIVITY)    Notable for borderline troponin, mild low CO2  EKG   EKG Interpretation  Date/Time:  Saturday January 14 2023 22:04:46 EDT Ventricular Rate:  87 PR Interval:  150 QRS Duration: 84 QT Interval:  380 QTC Calculation: 457 R Axis:   -78 Text Interpretation: Normal sinus rhythm Possible Left atrial enlargement Left axis deviation Nonspecific T wave abnormality Abnormal ECG When compared with ECG of 18-Nov-2022 15:00, No significant change since last tracing Confirmed by Alvino Blood ([id number]) on 01/14/2023 10:16:08 PM         Imaging Studies ordered: I ordered imaging studies including CXR On my interpretation imaging demonstrates no cardiothoracic process, chronic left shoulder subluxation  I independently visualized and interpreted imaging. I agree with the radiologist interpretation   Medicines ordered and prescription drug management: Meds ordered this encounter  Medications   prochlorperazine (COMPAZINE) injection 10 mg   oxyCODONE-acetaminophen (PERCOCET/ROXICET) 5-325 MG per tablet 1 tablet   diphenhydrAMINE (BENADRYL) injection 12.5 mg    -I have reviewed the patients home medicines and have made adjustments as needed    Reevaluation: After the interventions noted above, I reevaluated the patient and found that their symptoms have improved  Co morbidities that complicate the patient evaluation  Past Medical History:  Diagnosis Date   Abnormal Pap smear    Acute combined systolic and diastolic heart failure (HCC) 02/19/2019   Wt Readings from Last 3 Encounters:  09/18/21  84.7 kg  09/02/21  84.4 kg  08/31/21  84.2 kg         Acute heart failure (HCC) 01/29/2022   Acute on chronic combined systolic and diastolic CHF (congestive heart failure) (HCC) 02/19/2019   Acute on chronic congestive  heart failure (HCC)    Acute on chronic congestive heart failure (HCC)    Acute on chronic diastolic CHF (congestive heart failure) (HCC) 10/19/2021   Acute on chronic systolic heart failure (HCC) 02/22/2022   Anemia    Asthma    Atrial fibrillation (HCC)    history of paroxysmal atrial fibrillation   CHF (congestive heart failure) (HCC) 11/02/2021   CHF exacerbation (HCC) 10/17/2021   Diabetes mellitus    DKA, type 2 (HCC) 02/06/2019   Elevated troponin 09/30/2019   Heart failure with reduced ejection fraction (HCC)    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     Migraines    Morbid obesity (HCC) 02/06/2019   Nausea & vomiting 06/26/2022   Nonischemic cardiomyopathy (HCC) 09/22/2021   NSTEMI (non-ST elevated myocardial infarction) (HCC) 02/23/2022   Obstructive sleep apnea 06/30/2015   Polycystic ovarian disease       Dispostion: Disposition decision including need for hospitalization was considered, and patient disposition pending at time of sign out.    Final Clinical Impression(s) / ED Diagnoses Final diagnoses:  Chronic migraine without aura without status migrainosus, not intractable  Chest wall pain     This chart was dictated using voice recognition software.  Despite best efforts to proofread,  errors can occur which can change the documentation meaning.    Lonell Grandchild, MD 01/14/23 2351

## 2023-01-14 NOTE — ED Notes (Signed)
Pt came back from X-Ray.  Pt is throwing up at this time

## 2023-01-14 NOTE — ED Provider Notes (Signed)
11:26 PM Assumed care from Dr. Suezanne Jacquet, please see their note for full history, physical and decision making until this point. In brief this is a 47 y.o. year old female who presented to the ED tonight with Chest Pain     Multiple medical problems here with MSK chest pain and pending second troponin.  Migraine similar to previous. Getting HA cocktail, needs reevaluation for discharge. Has LUE paralysis that is continuing to improve from a previous stroke.   Patient feels better, requesting d/c. Troponin reassuring. No other changes since hand off. Started having a warm flushed feeling with the phenergan, likely akathisia, afebrile, VSS and WNL.   Discharge instructions, including strict return precautions for new or worsening symptoms, given. Patient and/or family verbalized understanding and agreement with the plan as described.   Labs, studies and imaging reviewed by myself and considered in medical decision making if ordered. Imaging interpreted by radiology.  Labs Reviewed  BASIC METABOLIC PANEL - Abnormal; Notable for the following components:      Result Value   CO2 18 (*)    Glucose, Bld 166 (*)    All other components within normal limits  CBC - Abnormal; Notable for the following components:   WBC 11.5 (*)    Hemoglobin 11.7 (*)    RDW 22.5 (*)    All other components within normal limits  TROPONIN I (HIGH SENSITIVITY) - Abnormal; Notable for the following components:   Troponin I (High Sensitivity) 20 (*)    All other components within normal limits  I-STAT BETA HCG BLOOD, ED ([facility], [facility], [facility] ONLY)  TROPONIN I (HIGH SENSITIVITY)    DG Chest 2 View  Final Result      No follow-ups on file.    Wright Gravely, Barbara Cower, MD 01/15/23 [phone]

## 2023-01-15 DIAGNOSIS — G43709 Chronic migraine without aura, not intractable, without status migrainosus: Secondary | ICD-10-CM | POA: Diagnosis not present

## 2023-01-15 MED ORDER — DIPHENHYDRAMINE HCL 25 MG PO CAPS
25.0000 mg | ORAL_CAPSULE | Freq: Once | ORAL | Status: AC
  Administered 2023-01-15: 25 mg via ORAL
  Filled 2023-01-15: qty 1

## 2023-01-20 ENCOUNTER — Other Ambulatory Visit (HOSPITAL_COMMUNITY): Payer: Self-pay

## 2023-01-20 ENCOUNTER — Other Ambulatory Visit: Payer: Self-pay | Admitting: Family Medicine

## 2023-01-20 ENCOUNTER — Other Ambulatory Visit: Payer: Self-pay

## 2023-01-20 ENCOUNTER — Telehealth: Payer: Self-pay | Admitting: Student

## 2023-01-20 DIAGNOSIS — G894 Chronic pain syndrome: Secondary | ICD-10-CM

## 2023-01-20 MED ORDER — LOSARTAN POTASSIUM 50 MG PO TABS
50.0000 mg | ORAL_TABLET | Freq: Every day | ORAL | 0 refills | Status: DC
  Filled 2023-01-20: qty 30, 30d supply, fill #0

## 2023-01-20 MED ORDER — METOPROLOL TARTRATE 25 MG PO TABS
25.0000 mg | ORAL_TABLET | Freq: Two times a day (BID) | ORAL | 0 refills | Status: DC
  Filled 2023-01-20: qty 60, 30d supply, fill #0

## 2023-01-20 MED ORDER — AMANTADINE HCL 100 MG PO CAPS
200.0000 mg | ORAL_CAPSULE | Freq: Every day | ORAL | 0 refills | Status: DC
  Filled 2023-01-20: qty 30, 15d supply, fill #0

## 2023-01-20 MED ORDER — APIXABAN 5 MG PO TABS
5.0000 mg | ORAL_TABLET | Freq: Two times a day (BID) | ORAL | 0 refills | Status: DC
  Filled 2023-01-20: qty 60, 30d supply, fill #0

## 2023-01-20 MED ORDER — TORSEMIDE 20 MG PO TABS
20.0000 mg | ORAL_TABLET | Freq: Every day | ORAL | 0 refills | Status: DC
  Filled 2023-01-20: qty 30, 30d supply, fill #0

## 2023-01-20 MED ORDER — ESCITALOPRAM OXALATE 10 MG PO TABS
10.0000 mg | ORAL_TABLET | Freq: Every day | ORAL | 0 refills | Status: DC
  Filled 2023-01-20: qty 30, 30d supply, fill #0

## 2023-01-20 MED ORDER — SPIRONOLACTONE 25 MG PO TABS
25.0000 mg | ORAL_TABLET | Freq: Every day | ORAL | 0 refills | Status: DC
  Filled 2023-01-20: qty 30, 30d supply, fill #0

## 2023-01-20 MED ORDER — EMPAGLIFLOZIN 25 MG PO TABS
25.0000 mg | ORAL_TABLET | Freq: Every day | ORAL | 0 refills | Status: DC
  Filled 2023-01-20: qty 30, 30d supply, fill #0

## 2023-01-20 MED ORDER — GLIPIZIDE 5 MG PO TABS
5.0000 mg | ORAL_TABLET | Freq: Every day | ORAL | 0 refills | Status: DC
  Filled 2023-01-20: qty 30, 30d supply, fill #0

## 2023-01-20 NOTE — Telephone Encounter (Signed)
**  After Hours/ Emergency Line Call**  Received a page to call [phone]) - [phone].  Patient: [name]  Caller: Self  Confirmed name & DOB of patient with caller  Subjective:  Calling for Asthma inhaler. Says her other meds were filled but not this one. Notes that her chest is tight and is feeling some SOB. She is frustrated that her medicine was not sent in.   Objective:  Observations: NAD, speaking in full complete sentences, no breathlessness   Assessment & Plan  [name] is a 47 y.o. female requesting inhaler for her asthma. Asthma not noted in chart. Patient also notes that her other meds were refilled, but not her inhaler or pain meds. Patient notes she has some chest tightness and SOB, but is speaking in full sentences, and sounds alright over the phone. Discussed seeking immediate medical care if symptoms worsen/does not get her medicine/did not improve. Told patient I would forward message to PCP.   Recommendations:  Seek medical care for continuation of symptoms   -- Red flags discussed.   -- Will forward to PCP.  Bess Kinds, MD [facility], PGY-1

## 2023-01-23 ENCOUNTER — Other Ambulatory Visit (HOSPITAL_COMMUNITY): Payer: Self-pay

## 2023-01-23 MED ORDER — PREGABALIN 75 MG PO CAPS
75.0000 mg | ORAL_CAPSULE | Freq: Two times a day (BID) | ORAL | 0 refills | Status: DC | PRN
  Filled 2023-01-23: qty 60, 30d supply, fill #0

## 2023-01-23 MED ORDER — ALBUTEROL SULFATE HFA 108 (90 BASE) MCG/ACT IN AERS
2.0000 | INHALATION_SPRAY | Freq: Four times a day (QID) | RESPIRATORY_TRACT | 2 refills | Status: AC | PRN
  Filled 2023-01-23: qty 18, 25d supply, fill #0

## 2023-01-23 NOTE — Addendum Note (Signed)
Addended by: Levin Erp on: 01/23/2023 07:29 AM   Modules accepted: Orders

## 2023-01-25 ENCOUNTER — Other Ambulatory Visit (HOSPITAL_COMMUNITY): Payer: Self-pay

## 2023-01-25 MED ORDER — OXYCODONE HCL 5 MG PO TABS
5.0000 mg | ORAL_TABLET | ORAL | 0 refills | Status: DC
  Filled 2023-01-25: qty 28, 5d supply, fill #0

## 2023-02-03 ENCOUNTER — Other Ambulatory Visit (HOSPITAL_COMMUNITY): Payer: Self-pay

## 2023-02-03 MED ORDER — OXYCODONE HCL 5 MG PO TABS
5.0000 mg | ORAL_TABLET | ORAL | 0 refills | Status: DC | PRN
  Filled 2023-02-03: qty 28, 5d supply, fill #0

## 2023-02-21 ENCOUNTER — Encounter: Payer: Self-pay | Admitting: Physical Medicine and Rehabilitation

## 2023-02-21 ENCOUNTER — Encounter
Payer: Medicare Other | Attending: Physical Medicine and Rehabilitation | Admitting: Physical Medicine and Rehabilitation

## 2023-02-21 ENCOUNTER — Other Ambulatory Visit (HOSPITAL_COMMUNITY): Payer: Self-pay

## 2023-02-21 VITALS — BP 153/128 | HR 105 | Ht 59.0 in | Wt 158.0 lb

## 2023-02-21 DIAGNOSIS — F5101 Primary insomnia: Secondary | ICD-10-CM | POA: Diagnosis not present

## 2023-02-21 DIAGNOSIS — Z8673 Personal history of transient ischemic attack (TIA), and cerebral infarction without residual deficits: Secondary | ICD-10-CM

## 2023-02-21 DIAGNOSIS — Z794 Long term (current) use of insulin: Secondary | ICD-10-CM

## 2023-02-21 DIAGNOSIS — E1159 Type 2 diabetes mellitus with other circulatory complications: Secondary | ICD-10-CM | POA: Diagnosis not present

## 2023-02-21 DIAGNOSIS — I152 Hypertension secondary to endocrine disorders: Secondary | ICD-10-CM | POA: Insufficient documentation

## 2023-02-21 DIAGNOSIS — F411 Generalized anxiety disorder: Secondary | ICD-10-CM | POA: Diagnosis not present

## 2023-02-21 MED ORDER — TRAZODONE HCL 50 MG PO TABS
25.0000 mg | ORAL_TABLET | Freq: Every day | ORAL | 0 refills | Status: DC
  Filled 2023-02-21: qty 30, 60d supply, fill #0

## 2023-02-21 MED ORDER — HYDROXYZINE HCL 10 MG PO TABS
10.0000 mg | ORAL_TABLET | Freq: Three times a day (TID) | ORAL | 0 refills | Status: DC | PRN
Start: 2023-02-21 — End: 2023-07-27
  Filled 2023-02-21: qty 30, 10d supply, fill #0

## 2023-02-21 NOTE — Progress Notes (Addendum)
Subjective:    Patient ID: [name], female    DOB: 04-06-1976, 47 y.o.   MRN: [id number]  HPI [name] is a 47 year old woman who presents for follow-up of CVA  1) CVA -she would benefit from home health -she is receiving support from her ather -she is able to walk to and from the bathroom easily.   2) HTN: -says she just took her blood pressure medication 15 minutes ago and so it hasn't kicked in   3) Anxiety:  -she needs refill of hydroxyzine  4) insomnia: -needs refill of her trazoodone  Pain Inventory Average Pain 8 Pain Right Now 10 My pain is constant, sharp, and burning  LOCATION OF PAIN  Lt arm  BOWEL Number of stools per week: 7 Oral laxative use Yes  Type of laxative unknown Enema or suppository use No  History of colostomy No  Incontinent No   BLADDER Normal In and out cath, frequency n/a Able to self cath  n/a Bladder incontinence No  Frequent urination No  Leakage with coughing No  Difficulty starting stream No  Incomplete bladder emptying No    Mobility ability to climb steps?  no do you drive?  no use a wheelchair needs help with transfers Do you have any goals in this area?  yes  Function 3 /2024  Neuro/Psych trouble walking spasms confusion depression anxiety suicidal thoughts  Prior Studies Xray arm LT  Physicians involved in your care Hospital followup   Family History  Problem Relation Age of Onset   Diabetes Mother    Hypertension Mother    Hypertension Father    Diabetes Father    Social History   Socioeconomic History   Marital status: Single    Spouse name: Not on file   Number of children: Not on file   Years of education: Not on file   Highest education level: Not on file  Occupational History   Not on file  Tobacco Use   Smoking status: Never   Smokeless tobacco: Never   Tobacco comments:    Never smoke 10/22/21  Vaping Use   Vaping status: Never Used  Substance and Sexual Activity    Alcohol use: No    Alcohol/week: 0.0 standard drinks of alcohol   Drug use: No   Sexual activity: Not Currently    Birth control/protection: None  Other Topics Concern   Not on file  Social History Narrative   Not on file   Social Determinants of Health   Financial Resource Strain: High Risk (10/17/2019)   Overall Financial Resource Strain (CARDIA)    Difficulty of Paying Living Expenses: Hard  Food Insecurity: No Food Insecurity (11/19/2022)   Hunger Vital Sign    Worried About Running Out of Food in the Last Year: Never true    Ran Out of Food in the Last Year: Never true  Transportation Needs: No Transportation Needs (11/19/2022)   PRAPARE - Administrator, Civil Service (Medical): No    Lack of Transportation (Non-Medical): No  Physical Activity: Insufficiently Active (08/19/2020)   Exercise Vital Sign    Days of Exercise per Week: 7 days    Minutes of Exercise per Session: 20 min  Stress: Stress Concern Present (08/19/2020)   [name] [employer] - Occupational Stress Questionnaire    Feeling of Stress : Very much  Social Connections: Not on file   Past Surgical History:  Procedure Laterality Date   BIOPSY  11/05/2021   Procedure: BIOPSY;  Surgeon: Jenel Lucks, MD;  Location: [facility] ENDOSCOPY;  Service: Gastroenterology;;   CERVIX LESION DESTRUCTION     CESAREAN SECTION     ESOPHAGOGASTRODUODENOSCOPY (EGD) WITH PROPOFOL N/A 11/05/2021   Procedure: ESOPHAGOGASTRODUODENOSCOPY (EGD) WITH PROPOFOL;  Surgeon: Jenel Lucks, MD;  Location: [facility] ENDOSCOPY;  Service: Gastroenterology;  Laterality: N/A;   IR CT HEAD LTD  09/24/2022   IR PERCUTANEOUS ART THROMBECTOMY/INFUSION INTRACRANIAL INC DIAG ANGIO  09/24/2022   RADIOLOGY WITH ANESTHESIA N/A 09/24/2022   Procedure: IR WITH ANESTHESIA;  Surgeon: Radiologist, Medication, MD;  Location: [facility] OR;  Service: Radiology;  Laterality: N/A;   RIGHT/LEFT HEART CATH AND CORONARY ANGIOGRAPHY N/A 03/29/2019    Procedure: RIGHT/LEFT HEART CATH AND CORONARY ANGIOGRAPHY;  Surgeon: Laurey Morale, MD;  Location: [facility] INVASIVE CV LAB;  Service: Cardiovascular;  Laterality: N/A;   RIGHT/LEFT HEART CATH AND CORONARY ANGIOGRAPHY N/A 09/20/2021   Procedure: RIGHT/LEFT HEART CATH AND CORONARY ANGIOGRAPHY;  Surgeon: Laurey Morale, MD;  Location: [facility] INVASIVE CV LAB;  Service: Cardiovascular;  Laterality: N/A;   Past Medical History:  Diagnosis Date   Abnormal Pap smear    Acute combined systolic and diastolic heart failure (HCC) 02/19/2019   Wt Readings from Last 3 Encounters:  09/18/21  84.7 kg  09/02/21  84.4 kg  08/31/21  84.2 kg         Acute heart failure (HCC) 01/29/2022   Acute on chronic combined systolic and diastolic CHF (congestive heart failure) (HCC) 02/19/2019   Acute on chronic congestive heart failure (HCC)    Acute on chronic congestive heart failure (HCC)    Acute on chronic diastolic CHF (congestive heart failure) (HCC) 10/19/2021   Acute on chronic systolic heart failure (HCC) 02/22/2022   Anemia    Asthma    Atrial fibrillation (HCC)    history of paroxysmal atrial fibrillation   CHF (congestive heart failure) (HCC) 11/02/2021   CHF exacerbation (HCC) 10/17/2021   Diabetes mellitus    DKA, type 2 (HCC) 02/06/2019   Elevated troponin 09/30/2019   Heart failure with reduced ejection fraction (HCC)    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     Migraines    Morbid obesity (HCC) 02/06/2019   Nausea & vomiting 06/26/2022   Nonischemic cardiomyopathy (HCC) 09/22/2021   NSTEMI (non-ST elevated myocardial infarction) (HCC)  02/23/2022   Obstructive sleep apnea 06/30/2015   Polycystic ovarian disease    BP (!) 150/117   Pulse (!) 105   Ht 4\' 11"  (1.499 m)   Wt 158 lb (71.7 kg)   SpO2 98%   BMI 31.91 kg/m   Opioid Risk Score:   Fall Risk Score:  `1  Depression screen [facility] 2/9     02/21/2023   10:45 AM 08/05/2022    9:39 AM 01/19/2022    3:07 PM 10/22/2021    1:31 PM 09/27/2021    4:33 PM 08/31/2021   11:15 AM 02/10/2020    1:46 PM  Depression screen PHQ 2/9  Decreased Interest 2 3 1 1 1 1 1   Down, Depressed, Hopeless 2 3 2 2  2  2 2  PHQ - 2 Score 4 6 3 3 3 3 3   Altered sleeping 0 3 3 2 3 2 3   Tired, decreased energy 1 3 2 1 1 1 2   Change in appetite 0 3 2 1 1 1 1   Feeling bad or failure about yourself  3  2 1 1 1 1   Trouble concentrating 2  2 0 1 0 1  Moving slowly or fidgety/restless 0  0 1 1 0 0  Suicidal thoughts 1 0 1 1 0 1 1  PHQ-9 Score 11 15 15 10 11 9 12   Difficult doing work/chores      Somewhat difficult      Review of Systems  Musculoskeletal:        Lt arm pain  All other systems reviewed and are negative.      Objective:   Physical Exam Gen: no distress, normal appearing HEENT: oral mucosa pink and moist, NCAT Cardio: Reg rate Chest: normal effort, normal rate of breathing Abd: soft, non-distended Ext: no edema Psych: pleasant, normal affect Skin: intact Neuro: Alert and oriented x3       Assessment & Plan:  1) CVA -prescribed home therapy Prescribed Zynex Nexwave  2) Anxiety: -prescribed hydroxyzine  3) Insomnia: -trazodone prescribed  4) HTN: -continue home blood pressure medications -Advised checking BP daily at home and logging results to bring into follow-up appointment with PCP and myself. -Reviewed BP meds today.  -Advised regarding healthy foods that can help lower blood pressure and provided with a list: 1) citrus foods- high in vitamins and minerals 2) salmon and other fatty fish - reduces inflammation and oxylipins 3) swiss chard (leafy green)-  high level of nitrates 4) pumpkin seeds- one of the best natural sources of magnesium 5) Beans and lentils- high in fiber, magnesium, and potassium 6) Berries- high in flavonoids 7) Amaranth (whole grain, can be cooked similarly to rice and oats)- high in magnesium and fiber 8) Pistachios- even more effective at reducing BP than other nuts 9) Carrots- high in phenolic compounds that relax blood vessels and reduce inflammation 10) Celery- contain phthalides that relax tissues of arterial walls 11) Tomatoes- can also improve cholesterol and reduce risk of heart disease 12) Broccoli- good source of magnesium, calcium, and potassium 13) Greek yogurt: high in potassium and calcium 14) Herbs and spices: Celery seed, cilantro, saffron, lemongrass, black cumin, ginseng, cinnamon, cardamom, sweet basil, and ginger 15) Chia and flax seeds- also help to lower cholesterol and blood sugar 16) Beets- high levels of nitrates that relax blood vessels  17) spinach and bananas- high in potassium  -Provided lise of supplements that can help with hypertension:  1) magnesium: one high quality brand is Bioptemizers since it contains all 7 types of magnesium, otherwise over the counter magnesium gluconate 400mg  is a good option 2) B vitamins 3) vitamin D 4) potassium 5) CoQ10 6) L-arginine 7) Vitamin C 8) Beetroot -Educated that goal BP is 120/80. -Made goal to incorporate some of the above foods into diet.

## 2023-02-28 ENCOUNTER — Telehealth: Payer: Self-pay

## 2023-02-28 ENCOUNTER — Telehealth: Payer: Self-pay | Admitting: *Deleted

## 2023-02-28 NOTE — Telephone Encounter (Signed)
Received call from [place] at [facility] regarding outstanding orders for home health.   Burna Mortimer states that this requires signature from Dr. Deirdre Priest.   I have placed this form in Dr. Russ Halo box for signature.   Once completed, please return to me and I will fax to [place].   Thanks.   Veronda Prude, RN

## 2023-02-28 NOTE — Telephone Encounter (Signed)
Enrique Sack PT from [name] is requesting an RX for a Left AFO be sent to [facility] to assist her walking due to foot drop.

## 2023-03-01 NOTE — Telephone Encounter (Signed)
Signed an placed in RN box

## 2023-03-02 NOTE — Telephone Encounter (Signed)
Faxed back to [place] at provided number.   Veronda Prude, RN

## 2023-03-02 NOTE — Telephone Encounter (Signed)
Order faxed to [facility] for AFO.

## 2023-03-03 ENCOUNTER — Other Ambulatory Visit (HOSPITAL_COMMUNITY): Payer: Self-pay

## 2023-03-03 MED ORDER — HYDROCODONE-ACETAMINOPHEN 5-325 MG PO TABS
1.0000 | ORAL_TABLET | Freq: Three times a day (TID) | ORAL | 0 refills | Status: DC | PRN
  Filled 2023-03-03: qty 25, 9d supply, fill #0

## 2023-03-08 ENCOUNTER — Other Ambulatory Visit: Payer: Self-pay

## 2023-03-08 ENCOUNTER — Emergency Department (HOSPITAL_COMMUNITY)
Admission: EM | Admit: 2023-03-08 | Discharge: 2023-03-08 | Disposition: A | Discharge status: Home / Self Care | Payer: Medicare Other | Source: Home / Self Care

## 2023-03-08 ENCOUNTER — Emergency Department (HOSPITAL_COMMUNITY): Payer: Medicare Other

## 2023-03-08 ENCOUNTER — Telehealth: Payer: Self-pay | Admitting: Internal Medicine

## 2023-03-08 DIAGNOSIS — Z8673 Personal history of transient ischemic attack (TIA), and cerebral infarction without residual deficits: Secondary | ICD-10-CM | POA: Insufficient documentation

## 2023-03-08 DIAGNOSIS — Z79899 Other long term (current) drug therapy: Secondary | ICD-10-CM | POA: Insufficient documentation

## 2023-03-08 DIAGNOSIS — I5022 Chronic systolic (congestive) heart failure: Secondary | ICD-10-CM | POA: Insufficient documentation

## 2023-03-08 DIAGNOSIS — Z7984 Long term (current) use of oral hypoglycemic drugs: Secondary | ICD-10-CM | POA: Diagnosis not present

## 2023-03-08 DIAGNOSIS — Z7901 Long term (current) use of anticoagulants: Secondary | ICD-10-CM | POA: Diagnosis not present

## 2023-03-08 DIAGNOSIS — I252 Old myocardial infarction: Secondary | ICD-10-CM | POA: Insufficient documentation

## 2023-03-08 DIAGNOSIS — E119 Type 2 diabetes mellitus without complications: Secondary | ICD-10-CM | POA: Diagnosis not present

## 2023-03-08 DIAGNOSIS — I428 Other cardiomyopathies: Secondary | ICD-10-CM | POA: Insufficient documentation

## 2023-03-08 DIAGNOSIS — R0602 Shortness of breath: Secondary | ICD-10-CM | POA: Diagnosis not present

## 2023-03-08 DIAGNOSIS — I5023 Acute on chronic systolic (congestive) heart failure: Secondary | ICD-10-CM | POA: Insufficient documentation

## 2023-03-08 DIAGNOSIS — M7989 Other specified soft tissue disorders: Secondary | ICD-10-CM | POA: Insufficient documentation

## 2023-03-08 DIAGNOSIS — I4891 Unspecified atrial fibrillation: Secondary | ICD-10-CM | POA: Insufficient documentation

## 2023-03-08 DIAGNOSIS — Z20822 Contact with and (suspected) exposure to covid-19: Secondary | ICD-10-CM | POA: Insufficient documentation

## 2023-03-08 LAB — BRAIN NATRIURETIC PEPTIDE: B Natriuretic Peptide: 764.2 pg/mL — ABNORMAL HIGH (ref 0.0–100.0)

## 2023-03-08 LAB — BASIC METABOLIC PANEL
Anion gap: 15 (ref 5–15)
BUN: 19 mg/dL (ref 6–20)
CO2: 26 mmol/L (ref 22–32)
Calcium: 8.9 mg/dL (ref 8.9–10.3)
Chloride: 98 mmol/L (ref 98–111)
Creatinine, Ser: 1.58 mg/dL — ABNORMAL HIGH (ref 0.44–1.00)
GFR, Estimated: 41 mL/min — ABNORMAL LOW (ref >60)
Glucose, Bld: 153 mg/dL — ABNORMAL HIGH (ref 70–99)
Potassium: 3.5 mmol/L (ref 3.5–5.1)
Sodium: 139 mmol/L (ref 135–145)

## 2023-03-08 LAB — CBC
HCT: 38 % (ref 36.0–46.0)
Hemoglobin: 12.5 g/dL (ref 12.0–15.0)
MCH: 25.3 pg — ABNORMAL LOW (ref 26.0–34.0)
MCHC: 32.9 g/dL (ref 30.0–36.0)
MCV: 76.8 fL — ABNORMAL LOW (ref 80.0–100.0)
Platelets: 238 10*3/uL (ref 150–400)
RBC: 4.95 MIL/uL (ref 3.87–5.11)
RDW: 15.3 % (ref 11.5–15.5)
WBC: 6.2 10*3/uL (ref 4.0–10.5)
nRBC: 0 % (ref 0.0–0.2)

## 2023-03-08 LAB — RESP PANEL BY RT-PCR (RSV, FLU A&B, COVID)  RVPGX2
Influenza A by PCR: NEGATIVE
Influenza B by PCR: NEGATIVE
Resp Syncytial Virus by PCR: NEGATIVE
SARS Coronavirus 2 by RT PCR: NEGATIVE

## 2023-03-08 LAB — TROPONIN I (HIGH SENSITIVITY)
Troponin I (High Sensitivity): 27 ng/L — ABNORMAL HIGH (ref <18)
Troponin I (High Sensitivity): 27 ng/L — ABNORMAL HIGH (ref <18)

## 2023-03-08 MED ORDER — ONDANSETRON HCL 4 MG/2ML IJ SOLN
4.0000 mg | Freq: Once | INTRAMUSCULAR | Status: AC
  Administered 2023-03-08: 4 mg via INTRAVENOUS
  Filled 2023-03-08: qty 2

## 2023-03-08 MED ORDER — HYDROCODONE-ACETAMINOPHEN 5-325 MG PO TABS
1.0000 | ORAL_TABLET | Freq: Once | ORAL | Status: AC
  Administered 2023-03-08: 1 via ORAL
  Filled 2023-03-08: qty 1

## 2023-03-08 MED ORDER — FUROSEMIDE 10 MG/ML IJ SOLN
20.0000 mg | Freq: Once | INTRAMUSCULAR | Status: AC
  Administered 2023-03-08: 20 mg via INTRAVENOUS
  Filled 2023-03-08: qty 2

## 2023-03-08 NOTE — ED Triage Notes (Signed)
Per EMS pt from home, pt c/o of SOB, weakness, headaches that come and go ever since she had her stroke in March, bilateral pitting edema noted on arrival. Pt does have a cardiology appointment tomorrow however cardiology thought it would be best that she came in today due to increasing swelling. She did take 50 mg of furosemide but prescribed for only 25 mg. Reports pain 10/10 legs feel like pins and needles that comes and goes.   Hx: Stroke and Left side deficit.

## 2023-03-08 NOTE — Telephone Encounter (Signed)
Pt c/o swelling: STAT is pt has developed SOB within 24 hours  How much weight have you gained and in what time span? Not sure   If swelling, where is the swelling located? Bilateral leg swelling, left greater than the right. Cool to the touch. Pitting.   Are you currently taking a fluid pill? Yes, but was only taking half. Started full dose today.    Are you currently SOB? No   Do you have a log of your daily weights (if so, list)? No   Have you gained 3 pounds in a day or 5 pounds in a week? Unsure   Have you traveled recently? No

## 2023-03-08 NOTE — Discharge Instructions (Signed)
It was a pleasure taking part in your care today.  As we discussed, your workup is reassuring.  I believe that your leg swelling is secondary to not having of Lasix, you state that you have been taking decreased amounts for the last 3 days.  Please begin taking your prescribed dosage of Lasix, 50 mg, once a day.  Please follow-up with your PCP this week to have your labs, creatinine, recheck to ensure that they are trending down.    Please return to the ED with any new or worsening signs or symptoms.

## 2023-03-08 NOTE — ED Provider Notes (Signed)
[name] Provider Note   CSN: [id number] Arrival date & time: 03/08/23  1432     History  Chief Complaint  Patient presents with   Leg Swelling    Bilateral pitting edema to lower extremities.     [name] is a 47 y.o. female with medical history of CVA with resulting left-sided deficits, NSTEMI, acute on chronic systolic CHF, nonischemic cardiomyopathy, left ventricular thrombus, type 2 diabetes, anemia, atrial fibrillation.  Patient presents to ED for evaluation of leg swelling, chest pain and shortness of breath.  The patient reports that for the last 1 week she has had progressively worsening swelling of her bilateral lower extremities.  She states that she has been taking her prescribed dosage of Lasix, 50 mg, up until 3 days ago when she decided to have her doses to 25 mg because she "got tired of getting up to pee".  Patient states that she has had worsening bilateral lower extremity edema since this time.  She states that last night she had an episode of chest pain that was of centralized in nature, nonradiating and associated with shortness of breath.  The patient reports that she has been sleeping with 3 pillows propping herself up at the last 5 nights.  She is also endorsing bilateral lower extremity swelling.  She states that today she took her prescribed 50 mg dosage.  She is also endorsing nausea and vomiting that been going on throughout the week.  She states that she has had a decreased appetite.  She denies sick contacts, abdominal pain, diarrhea, dysuria, one-sided weakness or numbness beyond her baseline, lightheadedness or dizziness.  She is also complaining of pins and needle sensation in bilateral lower extremities and states that this sensation has been ongoing and progressively worsening since she had a CVA in March.  On chart review, it appears that the patient has a diagnosis of neuropathy and takes Neurontin.  HPI      Home Medications Prior to Admission medications   Medication Sig Start Date End Date Taking? Authorizing Provider  Accu-Chek Softclix Lancets lancets USE 1  TO CHECK GLUCOSE 4 TIMES DAILY 01/12/23   Carney Living, MD  acetaminophen (TYLENOL) 500 MG tablet Take 500-1,000 mg by mouth every 6 (six) hours as needed for mild pain or headache.    [provider]  albuterol (VENTOLIN HFA) 108 (90 Base) MCG/ACT inhaler Inhale 2 puffs into the lungs every 6 (six) hours as needed for wheezing or shortness of breath. 01/23/23   Levin Erp, MD  amantadine (SYMMETREL) 100 MG capsule Take 2 capsules (200 mg total) by mouth daily. 01/20/23   Levin Erp, MD  apixaban (ELIQUIS) 5 MG TABS tablet Take 1 tablet (5 mg total) by mouth 2 (two) times daily. 01/20/23   Levin Erp, MD  atorvastatin (LIPITOR) 80 MG tablet Take 1 tablet (80 mg total) by mouth daily after supper. 10/31/22   Love, Evlyn Kanner, PA-C  blood glucose meter kit and supplies Use up to 4 (four) times daily as directed 06/06/22   [place], Alvira Philips, DO  Blood Glucose Monitoring Suppl (ACCU-CHEK GUIDE) w/Device KIT USE 1 UP TO 4 TIMES DAILY 01/12/23   Carney Living, MD  empagliflozin (JARDIANCE) 25 MG TABS tablet Take 1 tablet (25 mg total) by mouth daily. 01/20/23   Levin Erp, MD  escitalopram (LEXAPRO) 10 MG tablet Take 1 tablet (10 mg total) by mouth at bedtime. 01/20/23   Levin Erp,  MD  glipiZIDE (GLUCOTROL) 5 MG tablet Take 1 tablet (5 mg total) by mouth daily before breakfast. 01/20/23   Levin Erp, MD  glucose blood (ACCU-CHEK GUIDE) test strip USE  4 TIMES DAILY 01/12/23   Reece Leader, DO  HYDROcodone-acetaminophen (NORCO/VICODIN) 5-325 MG tablet Take 1 tablet by mouth 3 (three) times daily as needed for pain 03/03/23     hydrOXYzine (ATARAX) 10 MG tablet Take 1 tablet (10 mg total) by mouth 3 (three) times daily as needed. 02/21/23   Raulkar, Drema Pry, MD  losartan (COZAAR) 50 MG tablet Take 1  tablet (50 mg total) by mouth daily. 01/20/23   Levin Erp, MD  megestrol (MEGACE) 400 MG/10ML suspension Take 10 mLs (400 mg total) by mouth 2 (two) times daily. 12/07/22   Ganta, Anupa, DO  melatonin 5 MG TABS Take 1 tablet (5 mg total) by mouth at bedtime. 10/31/22   Love, Evlyn Kanner, PA-C  methocarbamol (ROBAXIN) 500 MG tablet Take 1 tablet (500 mg total) by mouth every 6 (six) hours as needed for muscle spasms. Patient taking differently: Take 500 mg by mouth daily. 12/07/22   Ganta, Anupa, DO  metoprolol tartrate (LOPRESSOR) 25 MG tablet Take 1 tablet (25 mg total) by mouth 2 (two) times daily. 01/20/23   Levin Erp, MD  oxyCODONE (OXY IR/ROXICODONE) 5 MG immediate release tablet Take 1 tablet (5 mg total) by mouth daily as needed for severe pain. 12/29/22   Ganta, Anupa, DO  oxyCODONE (OXY IR/ROXICODONE) 5 MG immediate release tablet Take 1 tablet (5 mg total) by mouth every 4 (four) hours as needed for pain 01/25/23     oxyCODONE (OXY IR/ROXICODONE) 5 MG immediate release tablet Take 1 tablet (5 mg total) by mouth every 4 (four) hours as needed for pain. 02/03/23     pregabalin (LYRICA) 75 MG capsule Take 1 capsule (75 mg total) by mouth 2 (two) times daily as needed for pain. 01/20/23     senna-docusate (SENOKOT-S) 8.6-50 MG tablet Take 3 tablets by mouth daily with lunch. Patient taking differently: Take 3 tablets by mouth as needed for mild constipation. 10/31/22   Love, Evlyn Kanner, PA-C  spironolactone (ALDACTONE) 25 MG tablet Take 1 tablet (25 mg total) by mouth daily. 01/20/23   Levin Erp, MD  torsemide (DEMADEX) 20 MG tablet Take 1 tablet (20 mg total) by mouth daily. 01/20/23   Levin Erp, MD  traZODone (DESYREL) 50 MG tablet Take 1/2 tablet (25 mg total) by mouth at bedtime. 02/21/23   Raulkar, Drema Pry, MD  Vitamin D, Ergocalciferol, (DRISDOL) 1.25 MG (50000 UNIT) CAPS capsule Take 1 capsule (50,000 Units total) by mouth every 7 (seven) days. 12/07/22   Reece Leader, DO   carvedilol (COREG) 3.125 MG tablet Take 1 tablet (3.125 mg total) by mouth 2 (two) times daily with a meal. 02/27/22 07/01/22  Meredeth Ide, MD      Allergies    Fish allergy, Shellfish allergy, Metformin and related, Nsaids, and Trulicity [dulaglutide]    Review of Systems   Review of Systems  All other systems reviewed and are negative.   Physical Exam Updated Vital Signs BP (!) 142/102   Pulse 67   Temp 98.2 F (36.8 C)   Resp (!) 25   Ht 4\' 11"  (1.499 m)   Wt 76.2 kg   SpO2 99%   BMI 33.93 kg/m  Physical Exam Vitals and nursing note reviewed.  Constitutional:      General: She is not in acute  distress.    Appearance: Normal appearance. She is not ill-appearing, toxic-appearing or diaphoretic.  HENT:     Head: Normocephalic and atraumatic.     Nose: Nose normal.     Mouth/Throat:     Mouth: Mucous membranes are moist.     Pharynx: Oropharynx is clear.  Eyes:     Extraocular Movements: Extraocular movements intact.     Conjunctiva/sclera: Conjunctivae normal.     Pupils: Pupils are equal, round, and reactive to light.  Cardiovascular:     Rate and Rhythm: Normal rate and regular rhythm.     Comments: 1+ nonpitting edema to bilateral lower extremities Pulmonary:     Effort: Pulmonary effort is normal.     Breath sounds: Normal breath sounds. No wheezing.  Abdominal:     General: Abdomen is flat. Bowel sounds are normal.     Palpations: Abdomen is soft.     Tenderness: There is no abdominal tenderness.  Musculoskeletal:     Cervical back: Normal range of motion and neck supple. No tenderness.     Right lower leg: 1+ Edema present.     Left lower leg: 1+ Edema present.  Skin:    General: Skin is warm and dry.     Capillary Refill: Capillary refill takes less than 2 seconds.  Neurological:     Mental Status: She is alert and oriented to person, place, and time. Mental status is at baseline.     Comments: Left-sided deficits which are unchanged from patient  baseline.     ED Results / Procedures / Treatments   Labs (all labs ordered are listed, but only abnormal results are displayed) Labs Reviewed  CBC - Abnormal; Notable for the following components:      Result Value   MCV 76.8 (*)    MCH 25.3 (*)    All other components within normal limits  BASIC METABOLIC PANEL - Abnormal; Notable for the following components:   Glucose, Bld 153 (*)    Creatinine, Ser 1.58 (*)    GFR, Estimated 41 (*)    All other components within normal limits  BRAIN NATRIURETIC PEPTIDE - Abnormal; Notable for the following components:   B Natriuretic Peptide 764.2 (*)    All other components within normal limits  TROPONIN I (HIGH SENSITIVITY) - Abnormal; Notable for the following components:   Troponin I (High Sensitivity) 27 (*)    All other components within normal limits  TROPONIN I (HIGH SENSITIVITY) - Abnormal; Notable for the following components:   Troponin I (High Sensitivity) 27 (*)    All other components within normal limits  RESP PANEL BY RT-PCR (RSV, FLU A&B, COVID)  [id number]    EKG EKG Interpretation Date/Time:  Wednesday March 08 2023 15:19:00 EDT Ventricular Rate:  66 PR Interval:  162 QRS Duration:  87 QT Interval:  656 QTC Calculation: 688 R Axis:   -1  Text Interpretation: Sinus rhythm Atrial premature complex Left atrial enlargement Borderline T abnormalities, lateral leads Prolonged QT interval No significant change since prior 6/24 Confirmed by Meridee Score [phone]) on 03/08/2023 3:52:46 PM  Radiology DG Chest 2 View  Result Date: 03/08/2023 CLINICAL DATA:  sob, cp EXAM: CHEST - 2 VIEW COMPARISON:  CXR 01/14/23 FINDINGS: Cardiomegaly. No pleural effusion. No pneumothorax. No focal airspace opacity. No radiographically apparent displaced rib fractures. Visualized upper abdomen is unremarkable. Vertebral body heights are maintained. Assessment of the lateral view is slightly limited due to arm positioning. There is persistent  inferior subluxation  of the left humeral head, which may be secondary to chronic degenerative changes of the rotator cuff or secondary to be joint effusion. IMPRESSION: 1.  Cardiomegaly. No focal airspace opacity. 2. Persistent inferior subluxation of the left humeral head, which may be secondary to chronic degenerative changes of the rotator cuff or secondary to a joint effusion. Correlate with physical exam. Electronically Signed   By: Lorenza Cambridge M.D.   On: 03/08/2023 16:30    Procedures Procedures   Medications Ordered in ED Medications  HYDROcodone-acetaminophen (NORCO/VICODIN) 5-325 MG per tablet 1 tablet (1 tablet Oral Given 03/08/23 1635)  ondansetron (ZOFRAN) injection 4 mg (4 mg Intravenous Given 03/08/23 1545)  furosemide (LASIX) injection 20 mg (20 mg Intravenous Given 03/08/23 1749)    ED Course/ Medical Decision Making/ A&P    Medical Decision Making Amount and/or Complexity of Data Reviewed Labs: ordered. Radiology: ordered.  Risk Prescription drug management.   47 year old female presents to the ED for evaluation.  Please see HPI for further details.  On examination, the patient is afebrile, nontachycardic.  Her lung sounds are clear bilaterally and she is not hypoxic.  Her abdomen is soft and compressible throughout.  Neurological examination at baseline with left-sided focal neurodeficits however unchanged from the patient baseline.   Patient CBC shows no leukocytosis, no anemia.  Metabolic panel shows creatinine 1.58, 1 month ago patient creatinine 0.76.  Patient BNP elevated to 764.2 however has had BNP elevations as high as 1231 in the past.  Patient troponin 27, 27 with delta.  Patient chest x-ray shows cardiomegaly.  Patient EKG is nonischemic.  Viral panel negative for all.  Patient given 2 mg of Lasix for further diuresis.  Patient diuresed well while she has been here.  The patient will also given 5 mg of hydrocodone and 4 mg of Zofran for nausea and  pain.  At this time, discussed patient with attending Dr. Charm Barges. Patient creatinine not high enough to warrant admission per attending. Patient has diuresed well here so after discussion with attending, patient will be discharged. The patient has been advised to have her labs rechecked this week with her PCP to ensure her creatinine is continuing to downtrend.  Patient was advised to begin taking her prescribed dosage of Lasix, 50 mg.  At this time the patient will be discharged home.  Her oxygen saturation has remained on 99% on room air while she has been here.  She has required no supplemental oxygen.  She will follow-up with her PCP this week to recheck her labs.  The patient was advised to return to the ED with any new or worsening signs or symptoms and she voiced understanding with the instructions.  She had all of her questions answered to her satisfaction.  She is stable to discharge at this time.  Final Clinical Impression(s) / ED Diagnoses Final diagnoses:  Leg swelling    Rx / DC Orders ED Discharge Orders     None         Al Decant, PA-C 03/08/23 1933    Coral Spikes, DO 03/14/23 [phone]

## 2023-03-08 NOTE — Telephone Encounter (Signed)
Returned call to pt. Pt is having SOB and HA. No lightheaded or dizziness. No chest pain but is having chest pressure and feels like something is sitting on it. BP 138 over 80 something unable to retake it today. Pt has only been taking 1/2 doses of her lasix but today she took a whole dose. Swelling in both lower legs that are cool to touch and are pitting. Swelling has been there for about a week. Pt was advised by this nurse to call 911 and they will take her to the ER. Pt verbalized understanding.

## 2023-03-09 ENCOUNTER — Other Ambulatory Visit (HOSPITAL_COMMUNITY): Payer: Self-pay

## 2023-03-09 ENCOUNTER — Other Ambulatory Visit: Payer: Self-pay | Admitting: Family Medicine

## 2023-03-10 ENCOUNTER — Ambulatory Visit: Payer: Medicare Other | Admitting: Internal Medicine

## 2023-03-13 ENCOUNTER — Other Ambulatory Visit (HOSPITAL_COMMUNITY): Payer: Self-pay

## 2023-03-13 ENCOUNTER — Other Ambulatory Visit: Payer: Self-pay | Admitting: Student

## 2023-03-13 MED ORDER — APIXABAN 5 MG PO TABS
5.0000 mg | ORAL_TABLET | Freq: Two times a day (BID) | ORAL | 0 refills | Status: DC
  Filled 2023-03-13: qty 60, 30d supply, fill #0

## 2023-03-13 MED ORDER — AMANTADINE HCL 100 MG PO CAPS
200.0000 mg | ORAL_CAPSULE | Freq: Every day | ORAL | 0 refills | Status: DC
  Filled 2023-03-13: qty 30, 15d supply, fill #0

## 2023-03-14 NOTE — Progress Notes (Unsigned)
Cardiology Clinic Note   Date: 03/16/2023 ID: Ruta, Otterman 1976-05-03, MRN [id number]  Primary Cardiologist:  Maisie Fus, MD  Patient Profile    [name] is a 47 y.o. female who presents to the clinic today for routine follow up.     Past medical history significant for: Nonobstructive CAD. R/LHC 09/20/2021: Mid LCx 30%.  Proximal to mid RCA 25%. PAF. Chronic combined systolic and diastolic heart failure/nonischemic cardiomyopathy. R/LHC 09/20/2021: Elevated right and left heart filling pressures.  Preserved cardiac output.  PAPI low at 1.5.  Pulmonary venous hypertension. Cardiac MRI 09/20/2021: Mildly dilated LV with LVEF 23%, diffuse hypokinesis with inferior akinesis.  Normal RV size with moderate systolic dysfunction EF 32%.  No myocardial LGE so no definitive evidence for prior MI, infiltrative disease, or myocarditis. Echo 10/12/2022: EF 15%.  Global hypokinesis.  Mild LVH.  Grade III DD.  Moderately reduced RV function.  Mild MR.  Moderate TR.  Large 2.8 x 2.5 cm LV apical thrombus (previous measurement 2.2 x 1.4 cm August 2023). Hypertension. Hyperlipidemia. OSA. CVA. Left hemiparesis.      History of Present Illness    [name] was first evaluated by Dr. Elberta Fortis on 01/01/2019 for new systolic heart failure thought to be A-fib/tachycardia induced cardiomyopathy at the request of Dr. Pollie Meyer.  She was started on Xarelto and carvedilol by PCP.  Patient was started on GDMT and her A-fib regimen was optimized.  Follow-up echo September 2020 showed EF 20 to 25%, diastolic parameters consistent with restrictive filling, global hypokinesis, apical thrombus, moderate RV systolic dysfunction, moderate TR, mild MR.  She underwent R/LHC which showed nonobstructive CAD and low PAPI suggesting significant RV dysfunction, pulmonary venous hypertension.  Patient was started on Coumadin for LV thrombus.  She was referred to heart failure clinic.  Echo March 2021 showed EF  <20%, moderate LVH, Grade II DD, severe RV dysfunction, moderately elevated PA pressure.  Echo June 2021 showed some improvement of EF to 35 to 40%, Grade I DD.  Patient was admitted to the hospital February 2023 for heart failure and chest pain.  Echo showed EF of 20%, Grade III DD, elevated LVEDP, severely reduced RV systolic function, moderately elevated PA systolic pressure, mild MR.  R/LHC showed mild nonobstructive CAD, nonischemic cardiomyopathy, elevated right left heart filling pressures with preserved cardiac output, pulmonary venous hypertension.  At that time Dr. Shirlee Latch believed cardiomyopathy could be caused by poorly controlled diabetes or hypertension.  Cardiac MRI to rule out myocarditis with no definitive evidence for prior MI, infiltrative disease, or myocarditis (details above).  Of note, patient was transition to Xarelto due to poor compliance with Coumadin and subtherapeutic INRs.  She was reportedly rude to staff and was dismissed from heart failure clinic.  She was switched back to Coumadin in July 2023.  She is followed by Dr. Wyline Mood.  Patient was last seen in the office by Dr. Wyline Mood on 11/09/2022.  She had missed a few visits.  She presented to the ED in March with shortness of breath.  INR was subtherapeutic.  Plan to switch back to DOAC.  During her stay she had an acute change in mental status with left-sided weakness and right gaze deviation.  Did not get tPA secondary to being on heparin.  Her MRI showed a large acute infarct in the right MCA and small area of acute infarct in the right ACA.  Persistent occlusion of the right M2 branch.  She underwent mechanical thrombectomy  for proximal right M2 occlusion.  Echo showed EF 15%.  She continued to have left-sided weakness and was in a wheelchair at the time of her visit.  GDMT was continued.  Patient unlikely ICD candidate secondary to persistent LV thrombus.  Not advanced heart failure candidate secondary to prior dismissal and lack of  medication compliance.  She reported family was helping her with medications.  She was continued on metoprolol, losartan, spironolactone, Jardiance.  She was intolerant to [facility] in the past secondary to hypotension.  Eliquis continued for LV thrombus and PAF.  Patient presented to the ED on 01/14/2023 with complaints of chest pain and headache.  Troponin 20>> 17.  Reproducible chest pain felt to be musculoskeletal and headache felt to be chronic migraine.  Patient was treated with pain medication and migraine cocktail.  Patient presented to the ED on 03/08/2023 via EMS with complaints of shortness of breath, weakness, headaches and bilateral pitting edema.  She contacted cardiology office and was instructed to present to the ED.  She also reported nausea and vomiting x 1 week.  Patient with 1+ edema bilaterally.  Troponin 27 x 2.  BNP 764.2.  She was diuresed with Lasix and pain was treated with hydrocodone and nausea with Zofran.  She was deemed stable for discharge home with close follow-up with PCP.  Today, patient is accompanied by her father. She reports overall she is doing well from a cardiac standpoint. Patient denies shortness of breath or dyspnea on exertion. No chest pain, pressure, or tightness.  She reports lower extremity edema is about the same.  Left leg is always bigger than her right since her stroke.  Brisk diuresis on current dose of torsemide.  She reports sensation of heart racing comes and goes.  She notices it mostly when she is walking to the bathroom and it resolves when she rests.  She has left-sided hemiparesis since her stroke in March.  She is seated in wheelchair.  Reports sharp pains in feet and legs that have been occurring since her stroke.  She has never been evaluated by neurology as an outpatient.  She denies blood in stool or urine or other bleeding concerns.    ROS: All other systems reviewed and are otherwise negative except as noted in History of Present  Illness.  Studies Reviewed      EKG is not ordered today.  Risk Assessment/Calculations     CHA2DS2-VASc Score = 5   This indicates a 7.2% annual risk of stroke. The patient's score is based upon: CHF History: 1 HTN History: 1 Diabetes History: 0 Stroke History: 2 Vascular Disease History: 0 Age Score: 0 Gender Score: 1         Physical Exam    VS:  BP (!) 131/90   Pulse 69   Ht 4\' 11"  (1.499 m)   Wt 158 lb (71.7 kg)   SpO2 99%   BMI 31.91 kg/m  , BMI Body mass index is 31.91 kg/m.  GEN: Well nourished, well developed, in no acute distress. Neck: No JVD or carotid bruits. Cardiac:  RRR. No murmurs. No rubs or gallops.   Respiratory:  Respirations regular and unlabored. Clear to auscultation without rales, wheezing or rhonchi. GI: Soft, nontender, nondistended. Extremities: Radials/DP/PT 2+ and equal bilaterally. No clubbing or cyanosis.  1+ pitting edema left lower extremity, trace edema right lower extremity. Skin: Warm and dry, no rash. Neuro: Strength intact.  Assessment & Plan    Nonobstructive CAD.  Per angiography February  2023. Patient denies chest pain, pressure, tightness.  She has left hemiparesis and is in a wheelchair.  She does walk a little bit at home to get to the bathroom.  Continue atorvastatin, metoprolol. Chronic combined systolic and diastolic heart failure/nonischemic cardiomyopathy.  Extensive workup as outlined above in patient profile.  Echo March 2024 showed EF 15%, Grade III DD, moderately reduced RV function.  Recent ED visit for shortness of breath and bilateral lower extremity edema August 2024.  She was diuresed with IV Lasix and discharged home.  Patient reports lower extremity edema is at baseline.  She denies shortness of breath, DOE, orthopnea, PND.  She sits in a dependent position much of the day.  Vascular ultrasound April 2024 was negative for DVT in the left leg.  Brisk diuresis on current dose of torsemide.  She has 1+ pitting  edema to left lower extremity and trace edema right lower extremity reported as baseline since stroke otherwise she is euvolemic and well compensated on exam.  Continue Jardiance, losartan, metoprolol, spironolactone, torsemide.  I believe edema in the left leg is related to immobility.  Instructed patient to begin wearing compression hose and elevating legs as much as possible. LV apical thrombus.  Echo March 2024 showed large apical thrombus.  She has had a history of this for years.  Continue Eliquis. PAF. Patient reports heart racing comes and goes particularly when she is walking to the bathroom and resolves with rest.  RRR on exam today.  Denies spontaneous bleeding concerns.  Continue metoprolol and Eliquis. Hypertension: BP today 131/90.  Patient denies headaches, dizziness or vision changes. Continue spironolactone, metoprolol, losartan.  Disposition: Refer to neurology.  Return in 3 months or sooner as needed.         Signed, Etta Grandchild. Alyaan Budzynski, DNP, NP-C

## 2023-03-16 ENCOUNTER — Other Ambulatory Visit: Payer: Self-pay

## 2023-03-16 ENCOUNTER — Other Ambulatory Visit (HOSPITAL_COMMUNITY): Payer: Self-pay

## 2023-03-16 ENCOUNTER — Encounter: Payer: Self-pay | Admitting: Student

## 2023-03-16 ENCOUNTER — Other Ambulatory Visit: Payer: Self-pay | Admitting: Physical Medicine and Rehabilitation

## 2023-03-16 ENCOUNTER — Other Ambulatory Visit: Payer: Self-pay | Admitting: Student

## 2023-03-16 ENCOUNTER — Ambulatory Visit: Payer: Medicare Other | Attending: Internal Medicine | Admitting: Student

## 2023-03-16 VITALS — BP 131/90 | HR 69 | Ht 59.0 in | Wt 158.0 lb

## 2023-03-16 DIAGNOSIS — I10 Essential (primary) hypertension: Secondary | ICD-10-CM | POA: Insufficient documentation

## 2023-03-16 DIAGNOSIS — I2510 Atherosclerotic heart disease of native coronary artery without angina pectoris: Secondary | ICD-10-CM | POA: Insufficient documentation

## 2023-03-16 DIAGNOSIS — I513 Intracardiac thrombosis, not elsewhere classified: Secondary | ICD-10-CM | POA: Insufficient documentation

## 2023-03-16 DIAGNOSIS — I5042 Chronic combined systolic (congestive) and diastolic (congestive) heart failure: Secondary | ICD-10-CM | POA: Insufficient documentation

## 2023-03-16 DIAGNOSIS — I639 Cerebral infarction, unspecified: Secondary | ICD-10-CM | POA: Insufficient documentation

## 2023-03-16 DIAGNOSIS — I480 Paroxysmal atrial fibrillation: Secondary | ICD-10-CM | POA: Insufficient documentation

## 2023-03-16 DIAGNOSIS — I428 Other cardiomyopathies: Secondary | ICD-10-CM | POA: Insufficient documentation

## 2023-03-16 MED ORDER — APIXABAN 5 MG PO TABS: 5.0000 mg | ORAL_TABLET | Freq: Two times a day (BID) | ORAL | 0 refills | Status: DC

## 2023-03-16 MED ORDER — APIXABAN 5 MG PO TABS
5.0000 mg | ORAL_TABLET | Freq: Two times a day (BID) | ORAL | 5 refills | Status: DC
  Filled 2023-03-16 – 2023-04-10 (×2): qty 60, 30d supply, fill #0

## 2023-03-16 MED ORDER — APIXABAN 5 MG PO TABS: 5.0000 mg | ORAL_TABLET | Freq: Two times a day (BID) | ORAL | 5 refills | Status: DC

## 2023-03-16 NOTE — Patient Instructions (Addendum)
Medication Instructions:  Your physician recommends that you continue on your current medications as directed. Please refer to the Current Medication list given to you today.  *If you need a refill on your cardiac medications before your next appointment, please call your pharmacy*   Lab Work: NONE ordered at this time of appointment    Testing/Procedures: NONE ordered at this time of appointment    Follow-Up: At [facility], you and your health needs are our priority.  As part of our continuing mission to provide you with exceptional heart care, we have created designated Provider Care Teams.  These Care Teams include your primary Cardiologist (physician) and Advanced Practice Providers (APPs -  Physician Assistants and Nurse Practitioners) who all work together to provide you with the care you need, when you need it.  We recommend signing up for the patient portal called "MyChart".  Sign up information is provided on this After Visit Summary.  MyChart is used to connect with patients for Virtual Visits (Telemedicine).  Patients are able to view lab/test results, encounter notes, upcoming appointments, etc.  Non-urgent messages can be sent to your provider as well.   To learn more about what you can do with MyChart, go to [email or web address].    Your next appointment:   3 month(s)  Provider:   Maisie Fus, MD

## 2023-03-17 ENCOUNTER — Other Ambulatory Visit (HOSPITAL_COMMUNITY): Payer: Self-pay

## 2023-03-17 MED ORDER — SPIRONOLACTONE 25 MG PO TABS
25.0000 mg | ORAL_TABLET | Freq: Every day | ORAL | 0 refills | Status: DC
  Filled 2023-03-17: qty 30, 30d supply, fill #0

## 2023-03-17 MED ORDER — TRAZODONE HCL 50 MG PO TABS
25.0000 mg | ORAL_TABLET | Freq: Every day | ORAL | 0 refills | Status: DC
  Filled 2023-03-17: qty 30, 60d supply, fill #0

## 2023-03-20 ENCOUNTER — Telehealth: Payer: Self-pay | Admitting: Physical Medicine and Rehabilitation

## 2023-03-27 ENCOUNTER — Other Ambulatory Visit: Payer: Self-pay | Admitting: Student

## 2023-03-28 ENCOUNTER — Other Ambulatory Visit: Payer: Self-pay | Admitting: Physical Medicine and Rehabilitation

## 2023-03-28 ENCOUNTER — Other Ambulatory Visit (HOSPITAL_COMMUNITY): Payer: Self-pay

## 2023-03-28 ENCOUNTER — Encounter: Payer: Self-pay | Admitting: Neurology

## 2023-03-28 MED ORDER — AMANTADINE HCL 100 MG PO CAPS
200.0000 mg | ORAL_CAPSULE | Freq: Every day | ORAL | 0 refills | Status: DC
  Filled 2023-03-28: qty 30, 15d supply, fill #0

## 2023-03-28 MED ORDER — TRAZODONE HCL 50 MG PO TABS
25.0000 mg | ORAL_TABLET | Freq: Every day | ORAL | 0 refills | Status: DC
  Filled 2023-03-28: qty 30, 60d supply, fill #0

## 2023-03-28 MED ORDER — ESCITALOPRAM OXALATE 10 MG PO TABS
10.0000 mg | ORAL_TABLET | Freq: Every day | ORAL | 0 refills | Status: DC
  Filled 2023-03-28: qty 30, 30d supply, fill #0

## 2023-03-28 MED ORDER — GLIPIZIDE 5 MG PO TABS
5.0000 mg | ORAL_TABLET | Freq: Every day | ORAL | 0 refills | Status: DC
  Filled 2023-03-28: qty 30, 30d supply, fill #0

## 2023-03-29 ENCOUNTER — Other Ambulatory Visit (HOSPITAL_COMMUNITY): Payer: Self-pay

## 2023-04-04 ENCOUNTER — Telehealth: Payer: Self-pay | Admitting: *Deleted

## 2023-04-04 ENCOUNTER — Ambulatory Visit (INDEPENDENT_AMBULATORY_CARE_PROVIDER_SITE_OTHER): Payer: Medicare Other | Admitting: *Deleted

## 2023-04-04 ENCOUNTER — Emergency Department (HOSPITAL_COMMUNITY)
Admission: EM | Admit: 2023-04-04 | Discharge: 2023-04-05 | Disposition: A | Discharge status: Home / Self Care | Payer: Medicare Other | Attending: Emergency Medicine | Admitting: Emergency Medicine

## 2023-04-04 ENCOUNTER — Other Ambulatory Visit: Payer: Self-pay

## 2023-04-04 ENCOUNTER — Encounter (HOSPITAL_COMMUNITY): Payer: Self-pay

## 2023-04-04 DIAGNOSIS — I110 Hypertensive heart disease with heart failure: Secondary | ICD-10-CM | POA: Insufficient documentation

## 2023-04-04 DIAGNOSIS — F331 Major depressive disorder, recurrent, moderate: Secondary | ICD-10-CM | POA: Diagnosis present

## 2023-04-04 DIAGNOSIS — R45851 Suicidal ideations: Secondary | ICD-10-CM | POA: Insufficient documentation

## 2023-04-04 DIAGNOSIS — E1110 Type 2 diabetes mellitus with ketoacidosis without coma: Secondary | ICD-10-CM | POA: Insufficient documentation

## 2023-04-04 DIAGNOSIS — I4891 Unspecified atrial fibrillation: Secondary | ICD-10-CM | POA: Diagnosis not present

## 2023-04-04 DIAGNOSIS — I509 Heart failure, unspecified: Secondary | ICD-10-CM | POA: Diagnosis not present

## 2023-04-04 DIAGNOSIS — Z7901 Long term (current) use of anticoagulants: Secondary | ICD-10-CM | POA: Insufficient documentation

## 2023-04-04 DIAGNOSIS — Z7984 Long term (current) use of oral hypoglycemic drugs: Secondary | ICD-10-CM | POA: Insufficient documentation

## 2023-04-04 DIAGNOSIS — F329 Major depressive disorder, single episode, unspecified: Secondary | ICD-10-CM | POA: Diagnosis present

## 2023-04-04 DIAGNOSIS — Z0000 Encounter for general adult medical examination without abnormal findings: Secondary | ICD-10-CM

## 2023-04-04 DIAGNOSIS — Z79899 Other long term (current) drug therapy: Secondary | ICD-10-CM | POA: Diagnosis not present

## 2023-04-04 DIAGNOSIS — J45909 Unspecified asthma, uncomplicated: Secondary | ICD-10-CM | POA: Diagnosis not present

## 2023-04-04 LAB — COMPREHENSIVE METABOLIC PANEL
ALT: 16 U/L (ref 0–44)
AST: 28 U/L (ref 15–41)
Albumin: 3.7 g/dL (ref 3.5–5.0)
Alkaline Phosphatase: 151 U/L — ABNORMAL HIGH (ref 38–126)
Anion gap: 13 (ref 5–15)
BUN: 20 mg/dL (ref 6–20)
CO2: 23 mmol/L (ref 22–32)
Calcium: 9.2 mg/dL (ref 8.9–10.3)
Chloride: 96 mmol/L — ABNORMAL LOW (ref 98–111)
Creatinine, Ser: 0.96 mg/dL (ref 0.44–1.00)
GFR, Estimated: >60 mL/min (ref >60)
Glucose, Bld: 154 mg/dL — ABNORMAL HIGH (ref 70–99)
Potassium: 3.7 mmol/L (ref 3.5–5.1)
Sodium: 132 mmol/L — ABNORMAL LOW (ref 135–145)
Total Bilirubin: 2.3 mg/dL — ABNORMAL HIGH (ref 0.3–1.2)
Total Protein: 8.4 g/dL — ABNORMAL HIGH (ref 6.5–8.1)

## 2023-04-04 LAB — CBC
HCT: 49.3 % — ABNORMAL HIGH (ref 36.0–46.0)
Hemoglobin: 15.7 g/dL — ABNORMAL HIGH (ref 12.0–15.0)
MCH: 23.8 pg — ABNORMAL LOW (ref 26.0–34.0)
MCHC: 31.8 g/dL (ref 30.0–36.0)
MCV: 74.8 fL — ABNORMAL LOW (ref 80.0–100.0)
Platelets: 245 10*3/uL (ref 150–400)
RBC: 6.59 MIL/uL — ABNORMAL HIGH (ref 3.87–5.11)
RDW: 18.4 % — ABNORMAL HIGH (ref 11.5–15.5)
WBC: 7.1 10*3/uL (ref 4.0–10.5)
nRBC: 0 % (ref 0.0–0.2)

## 2023-04-04 MED ORDER — LOSARTAN POTASSIUM 50 MG PO TABS
50.0000 mg | ORAL_TABLET | Freq: Every day | ORAL | Status: DC
  Filled 2023-04-04: qty 1

## 2023-04-04 MED ORDER — HYDROXYZINE HCL 10 MG PO TABS: 10.0000 mg | ORAL_TABLET | Freq: Three times a day (TID) | ORAL | Status: DC | PRN

## 2023-04-04 MED ORDER — ACETAMINOPHEN 325 MG PO TABS: 650.0000 mg | ORAL_TABLET | ORAL | Status: DC | PRN

## 2023-04-04 MED ORDER — PREGABALIN 50 MG PO CAPS
75.0000 mg | ORAL_CAPSULE | Freq: Two times a day (BID) | ORAL | Status: DC | PRN
  Administered 2023-04-04: 75 mg via ORAL
  Filled 2023-04-04: qty 1

## 2023-04-04 MED ORDER — EMPAGLIFLOZIN 25 MG PO TABS
25.0000 mg | ORAL_TABLET | Freq: Every day | ORAL | Status: DC
  Filled 2023-04-04: qty 1

## 2023-04-04 MED ORDER — APIXABAN 5 MG PO TABS
5.0000 mg | ORAL_TABLET | Freq: Two times a day (BID) | ORAL | Status: DC
  Administered 2023-04-04: 5 mg via ORAL
  Filled 2023-04-04 (×2): qty 1

## 2023-04-04 MED ORDER — ESCITALOPRAM OXALATE 10 MG PO TABS
10.0000 mg | ORAL_TABLET | Freq: Every day | ORAL | Status: DC
  Filled 2023-04-04: qty 1

## 2023-04-04 MED ORDER — GLIPIZIDE 5 MG PO TABS
5.0000 mg | ORAL_TABLET | Freq: Every day | ORAL | Status: DC
  Filled 2023-04-04: qty 1

## 2023-04-04 MED ORDER — ALUM & MAG HYDROXIDE-SIMETH 200-200-20 MG/5ML PO SUSP: 30.0000 mL | Freq: Four times a day (QID) | ORAL | Status: DC | PRN

## 2023-04-04 MED ORDER — METOPROLOL TARTRATE 25 MG PO TABS
25.0000 mg | ORAL_TABLET | Freq: Two times a day (BID) | ORAL | Status: DC
  Filled 2023-04-04: qty 1

## 2023-04-04 MED ORDER — TRAZODONE HCL 50 MG PO TABS
25.0000 mg | ORAL_TABLET | Freq: Every day | ORAL | Status: DC
  Administered 2023-04-04: 25 mg via ORAL
  Filled 2023-04-04: qty 1

## 2023-04-04 MED ORDER — ATORVASTATIN CALCIUM 40 MG PO TABS: 80.0000 mg | ORAL_TABLET | Freq: Every day | ORAL | Status: DC

## 2023-04-04 MED ORDER — TORSEMIDE 20 MG PO TABS
20.0000 mg | ORAL_TABLET | Freq: Every day | ORAL | Status: DC
  Filled 2023-04-04 (×2): qty 1

## 2023-04-04 MED ORDER — ALBUTEROL SULFATE HFA 108 (90 BASE) MCG/ACT IN AERS: 2.0000 | INHALATION_SPRAY | Freq: Four times a day (QID) | RESPIRATORY_TRACT | Status: DC | PRN

## 2023-04-04 MED ORDER — ONDANSETRON HCL 4 MG PO TABS: 4.0000 mg | ORAL_TABLET | Freq: Three times a day (TID) | ORAL | Status: DC | PRN

## 2023-04-04 NOTE — ED Notes (Signed)
Patient dressed out in purple scrubs by Rushie Goltz, NT and [place], [place].

## 2023-04-04 NOTE — Patient Instructions (Signed)
[name] , Thank you for taking time to come for your Medicare Wellness Visit. I appreciate your ongoing commitment to your health goals. Please review the following plan we discussed and let me know if I can assist you in the future.   Screening recommendations/referrals: Mammogram:  Recommended yearly ophthalmology/optometry visit for glaucoma screening and checkup Recommended yearly dental visit for hygiene and checkup  Vaccinations: Influenza vaccine:  Education provided Pneumococcal vaccine: Education provided Tdap vaccine: Education provided    Preventive Care   40 Years and Older, Female Preventive care refers to lifestyle choices and visits with your health care provider that can promote health and wellness. What does preventive care include? A yearly physical exam. This is also called an annual well check. Dental exams once or twice a year. Routine eye exams. Ask your health care provider how often you should have your eyes checked. Personal lifestyle choices, including: Daily care of your teeth and gums. Regular physical activity. Eating a healthy diet. Avoiding tobacco and drug use. Limiting alcohol use. Practicing safe sex. Taking low-dose aspirin every day. Taking vitamin and mineral supplements as recommended by your health care provider. What happens during an annual well check? The services and screenings done by your health care provider during your annual well check will depend on your age, overall health, lifestyle risk factors, and family history of disease. Counseling  Your health care provider may ask you questions about your: Alcohol use. Tobacco use. Drug use. Emotional well-being. Home and relationship well-being. Sexual activity. Eating habits. History of falls. Memory and ability to understand (cognition). Work and work Astronomer. Reproductive health. Screening  You may have the following tests or measurements: Height, weight, and  BMI. Blood pressure. Lipid and cholesterol levels. These may be checked every 5 years, or more frequently if you are over 68 years old. Skin check. Lung cancer screening. You may have this screening every year starting at age 8 if you have a 30-pack-year history of smoking and currently smoke or have quit within the past 15 years. Fecal occult blood test (FOBT) of the stool. You may have this test every year starting at age 34. Flexible sigmoidoscopy or colonoscopy. You may have a sigmoidoscopy every 5 years or a colonoscopy every 10 years starting at age 48. Hepatitis C blood test. Hepatitis B blood test. Sexually transmitted disease (STD) testing. Diabetes screening. This is done by checking your blood sugar (glucose) after you have not eaten for a while (fasting). You may have this done every 1-3 years. Bone density scan. This is done to screen for osteoporosis. You may have this done starting at age 18. Mammogram. This may be done every 1-2 years. Talk to your health care provider about how often you should have regular mammograms. Talk with your health care provider about your test results, treatment options, and if necessary, the need for more tests. Vaccines  Your health care provider may recommend certain vaccines, such as: Influenza vaccine. This is recommended every year. Tetanus, diphtheria, and acellular pertussis (Tdap, Td) vaccine. You may need a Td booster every 10 years. Zoster vaccine. You may need this after age 23. Pneumococcal 13-valent conjugate (PCV13) vaccine. One dose is recommended after age 30. Pneumococcal polysaccharide (PPSV23) vaccine. One dose is recommended after age 45. Talk to your health care provider about which screenings and vaccines you need and how often you need them. This information is not intended to replace advice given to you by your health care provider. Make sure you  discuss any questions you have with your health care provider. Document  Released: 08/07/2015 Document Revised: 03/30/2016 Document Reviewed: 05/12/2015 Elsevier Interactive Patient Education  2017 [employer].  Fall Prevention in the Home Falls can cause injuries. They can happen to people of all ages. There are many things you can do to make your home safe and to help prevent falls. What can I do on the outside of my home? Regularly fix the edges of walkways and driveways and fix any cracks. Remove anything that might make you trip as you walk through a door, such as a raised step or threshold. Trim any bushes or trees on the path to your home. Use bright outdoor lighting. Clear any walking paths of anything that might make someone trip, such as rocks or tools. Regularly check to see if handrails are loose or broken. Make sure that both sides of any steps have handrails. Any raised decks and porches should have guardrails on the edges. Have any leaves, snow, or ice cleared regularly. Use sand or salt on walking paths during winter. Clean up any spills in your garage right away. This includes oil or grease spills. What can I do in the bathroom? Use night lights. Install grab bars by the toilet and in the tub and shower. Do not use towel bars as grab bars. Use non-skid mats or decals in the tub or shower. If you need to sit down in the shower, use a plastic, non-slip stool. Keep the floor dry. Clean up any water that spills on the floor as soon as it happens. Remove soap buildup in the tub or shower regularly. Attach bath mats securely with double-sided non-slip rug tape. Do not have throw rugs and other things on the floor that can make you trip. What can I do in the bedroom? Use night lights. Make sure that you have a light by your bed that is easy to reach. Do not use any sheets or blankets that are too big for your bed. They should not hang down onto the floor. Have a firm chair that has side arms. You can use this for support while you get dressed. Do  not have throw rugs and other things on the floor that can make you trip. What can I do in the kitchen? Clean up any spills right away. Avoid walking on wet floors. Keep items that you use a lot in easy-to-reach places. If you need to reach something above you, use a strong step stool that has a grab bar. Keep electrical cords out of the way. Do not use floor polish or wax that makes floors slippery. If you must use wax, use non-skid floor wax. Do not have throw rugs and other things on the floor that can make you trip. What can I do with my stairs? Do not leave any items on the stairs. Make sure that there are handrails on both sides of the stairs and use them. Fix handrails that are broken or loose. Make sure that handrails are as long as the stairways. Check any carpeting to make sure that it is firmly attached to the stairs. Fix any carpet that is loose or worn. Avoid having throw rugs at the top or bottom of the stairs. If you do have throw rugs, attach them to the floor with carpet tape. Make sure that you have a light switch at the top of the stairs and the bottom of the stairs. If you do not have them, ask someone to  add them for you. What else can I do to help prevent falls? Wear shoes that: Do not have high heels. Have rubber bottoms. Are comfortable and fit you well. Are closed at the toe. Do not wear sandals. If you use a stepladder: Make sure that it is fully opened. Do not climb a closed stepladder. Make sure that both sides of the stepladder are locked into place. Ask someone to hold it for you, if possible. Clearly mark and make sure that you can see: Any grab bars or handrails. First and last steps. Where the edge of each step is. Use tools that help you move around (mobility aids) if they are needed. These include: Canes. Walkers. Scooters. Crutches. Turn on the lights when you go into a dark area. Replace any light bulbs as soon as they burn out. Set up your  furniture so you have a clear path. Avoid moving your furniture around. If any of your floors are uneven, fix them. If there are any pets around you, be aware of where they are. Review your medicines with your doctor. Some medicines can make you feel dizzy. This can increase your chance of falling. Ask your doctor what other things that you can do to help prevent falls. This information is not intended to replace advice given to you by your health care provider. Make sure you discuss any questions you have with your health care provider. Document Released: 05/07/2009 Document Revised: 12/17/2015 Document Reviewed: 08/15/2014 Elsevier Interactive Patient Education  2017 [employer].

## 2023-04-04 NOTE — Telephone Encounter (Signed)
Patient states she is having issues with her oxycodone for pain level and her Trazadone  she was advised to cut in half , She states she takes a whole pill so now she is out of Trazodone.   I did review notes from visit that did discuss these medications.    Was advised she would have to be seen every month for refills.     Patient is also very upset with her living situation. 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    Patient did not schedule an appointment at this time was told, she may have to schedule .

## 2023-04-04 NOTE — ED Notes (Signed)
Patient wanded by security. 

## 2023-04-04 NOTE — ED Notes (Signed)
Pt belonging: shirt, pants, socks in the cabinet [address] cabinet in nursing station.

## 2023-04-04 NOTE — ED Triage Notes (Addendum)
Patient arrived with [employer] after being IVC'd by family. Per IVC paperwork, patient recently suffered CVA in March 2024 and family states patient's mental health has started to decline. Per paperwork, patient has previously stated "that she wants to die and that she prays she doesn't wake." Patient has hx of cutting her wrist and states that she "wants help" but did not want to come to the hospital. Patient has left-sided weakness from stroke. Patient's pill box with medication and walker given to this RN by [employer].

## 2023-04-04 NOTE — ED Provider Notes (Signed)
EMERGENCY DEPARTMENT AT [facility] Provider Note   CSN: [id number] Arrival date & time: 04/04/23  1918     History  Chief Complaint  Patient presents with   IVC    [name] is a 47 y.o. female brought to the ER due to being IVC's by family.  Patient denies any complaints.  She denies any suicidal ideation, homicidal ideation, auditory or visual hallucinations.  Per IVC paperwork, patient suffered a CVA in March 2024 that left her with left sided deficits and since then her mental health has started to decline.  Per the paperwork, patient has previously stated "that she wants to die and that she prays she does not wake".    Past medical history includes CVA with resulting left-sided deficits, NSTEMI, acute on chronic systolic CHF, nonischemic cardiomyopathy, left ventricular thrombus, type 2 diabetes, anemia, atrial fibrillation   HPI     Home Medications Prior to Admission medications   Medication Sig Start Date End Date Taking? Authorizing Provider  acetaminophen (TYLENOL) 500 MG tablet Take 500-1,000 mg by mouth every 6 (six) hours as needed for mild pain or headache (OR CRAMPING).   Yes [provider]  amantadine (SYMMETREL) 100 MG capsule Take 2 capsules (200 mg total) by mouth daily. 03/28/23  Yes Levin Erp, MD  apixaban (ELIQUIS) 5 MG TABS tablet Take 1 tablet (5 mg total) by mouth 2 (two) times daily. 03/16/23  Yes Wittenborn, Gavin Pound, NP  empagliflozin (JARDIANCE) 25 MG TABS tablet Take 1 tablet (25 mg total) by mouth daily. 01/20/23  Yes Levin Erp, MD  escitalopram (LEXAPRO) 10 MG tablet Take 1 tablet (10 mg total) by mouth at bedtime. 03/28/23  Yes Levin Erp, MD  glipiZIDE (GLUCOTROL) 5 MG tablet Take 1 tablet (5 mg total) by mouth daily before breakfast. 03/28/23  Yes Levin Erp, MD  HYDROcodone-acetaminophen (NORCO/VICODIN) 5-325 MG tablet Take 1 tablet by mouth 3 (three) times daily as needed for pain 03/03/23  Yes    hydrOXYzine (ATARAX) 10 MG tablet Take 1 tablet (10 mg total) by mouth 3 (three) times daily as needed. Patient taking differently: Take 10 mg by mouth 3 (three) times daily as needed for anxiety. 02/21/23  Yes Raulkar, Drema Pry, MD  losartan (COZAAR) 50 MG tablet Take 1 tablet (50 mg total) by mouth daily. 01/20/23  Yes Levin Erp, MD  metoprolol tartrate (LOPRESSOR) 25 MG tablet Take 1 tablet (25 mg total) by mouth 2 (two) times daily. 01/20/23  Yes Levin Erp, MD  pregabalin (LYRICA) 75 MG capsule Take 1 capsule (75 mg total) by mouth 2 (two) times daily as needed for pain. 01/20/23  Yes   torsemide (DEMADEX) 20 MG tablet Take 1 tablet (20 mg total) by mouth daily. 01/20/23  Yes Levin Erp, MD  traZODone (DESYREL) 50 MG tablet Take 1/2 tablet (25 mg total) by mouth at bedtime. Patient taking differently: Take 50-100 mg by mouth at bedtime. 03/28/23  Yes Raulkar, Drema Pry, MD  Accu-Chek Softclix Lancets lancets USE 1  TO CHECK GLUCOSE 4 TIMES DAILY 01/12/23   Chambliss, Estill Batten, MD  albuterol (VENTOLIN HFA) 108 (90 Base) MCG/ACT inhaler Inhale 2 puffs into the lungs every 6 (six) hours as needed for wheezing or shortness of breath. 01/23/23   Levin Erp, MD  apixaban (ELIQUIS) 5 MG TABS tablet Take 1 tablet (5 mg total) by mouth 2 (two) times daily. Patient not taking: Reported on 04/04/2023 03/16/23   Carlos Levering, NP  atorvastatin (LIPITOR)  80 MG tablet Take 1 tablet (80 mg total) by mouth daily after supper. Patient not taking: Reported on 04/04/2023 10/31/22   Jacquelynn Cree, PA-C  blood glucose meter kit and supplies Use up to 4 (four) times daily as directed 06/06/22   [place], Alvira Philips, DO  Blood Glucose Monitoring Suppl (ACCU-CHEK GUIDE) w/Device KIT USE 1 UP TO 4 TIMES DAILY 01/12/23   Carney Living, MD  glucose blood (ACCU-CHEK GUIDE) test strip USE  4 TIMES DAILY 01/12/23   Ganta, Anupa, DO  megestrol (MEGACE) 400 MG/10ML suspension Take 10 mLs (400 mg total)  by mouth 2 (two) times daily. Patient not taking: Reported on 04/04/2023 12/07/22   Ganta, Anupa, DO  melatonin 5 MG TABS Take 1 tablet (5 mg total) by mouth at bedtime. 10/31/22   Love, Evlyn Kanner, PA-C  methocarbamol (ROBAXIN) 500 MG tablet Take 1 tablet (500 mg total) by mouth every 6 (six) hours as needed for muscle spasms. Patient not taking: Reported on 04/04/2023 12/07/22   Reece Leader, DO  oxyCODONE (OXY IR/ROXICODONE) 5 MG immediate release tablet Take 1 tablet (5 mg total) by mouth daily as needed for severe pain. Patient not taking: Reported on 04/04/2023 12/29/22   Reece Leader, DO  oxyCODONE (OXY IR/ROXICODONE) 5 MG immediate release tablet Take 1 tablet (5 mg total) by mouth every 4 (four) hours as needed for pain Patient not taking: Reported on 04/04/2023 01/25/23     oxyCODONE (OXY IR/ROXICODONE) 5 MG immediate release tablet Take 1 tablet (5 mg total) by mouth every 4 (four) hours as needed for pain. Patient not taking: Reported on 04/04/2023 02/03/23     senna-docusate (SENOKOT-S) 8.6-50 MG tablet Take 3 tablets by mouth daily with lunch. Patient not taking: Reported on 04/04/2023 10/31/22   Love, Evlyn Kanner, PA-C  spironolactone (ALDACTONE) 25 MG tablet Take 1 tablet (25 mg total) by mouth daily. 03/17/23   Levin Erp, MD  Vitamin D, Ergocalciferol, (DRISDOL) 1.25 MG (50000 UNIT) CAPS capsule Take 1 capsule (50,000 Units total) by mouth every 7 (seven) days. Patient not taking: Reported on 04/04/2023 12/07/22   Reece Leader, DO  carvedilol (COREG) 3.125 MG tablet Take 1 tablet (3.125 mg total) by mouth 2 (two) times daily with a meal. 02/27/22 07/01/22  Meredeth Ide, MD      Allergies    Fish allergy, Shellfish allergy, Metformin and related, Nsaids, and Trulicity [dulaglutide]    Review of Systems   Review of Systems  Psychiatric/Behavioral:  Negative for agitation, hallucinations and suicidal ideas.     Physical Exam Updated Vital Signs BP (!) 124/91 (BP Location: Right Arm)   Pulse  75   Temp 98.9 F (37.2 C) (Oral)   Resp 17   Ht 4\' 11"  (1.499 m)   Wt 75 kg   SpO2 100%   BMI 33.40 kg/m  Physical Exam Vitals and nursing note reviewed.  Constitutional:      General: She is not in acute distress.    Appearance: She is well-developed.  HENT:     Head: Normocephalic and atraumatic.  Eyes:     Conjunctiva/sclera: Conjunctivae normal.  Cardiovascular:     Rate and Rhythm: Normal rate and regular rhythm.     Heart sounds: No murmur heard. Pulmonary:     Effort: Pulmonary effort is normal. No respiratory distress.     Breath sounds: Normal breath sounds.  Abdominal:     Palpations: Abdomen is soft.     Tenderness: There is  no abdominal tenderness.  Musculoskeletal:        General: No swelling.     Cervical back: Neck supple.  Skin:    General: Skin is warm and dry.     Capillary Refill: Capillary refill takes less than 2 seconds.  Neurological:     Mental Status: She is alert.  Psychiatric:        Mood and Affect: Mood normal.     ED Results / Procedures / Treatments   Labs (all labs ordered are listed, but only abnormal results are displayed) Labs Reviewed  CBC - Abnormal; Notable for the following components:      Result Value   RBC 6.59 (*)    Hemoglobin 15.7 (*)    HCT 49.3 (*)    MCV 74.8 (*)    MCH 23.8 (*)    RDW 18.4 (*)    All other components within normal limits  COMPREHENSIVE METABOLIC PANEL - Abnormal; Notable for the following components:   Sodium 132 (*)    Chloride 96 (*)    Glucose, Bld 154 (*)    Total Protein 8.4 (*)    Alkaline Phosphatase 151 (*)    Total Bilirubin 2.3 (*)    All other components within normal limits  HCG, SERUM, QUALITATIVE    EKG None  Radiology No results found.  Procedures Procedures    Medications Ordered in ED Medications  acetaminophen (TYLENOL) tablet 650 mg (has no administration in time range)  ondansetron (ZOFRAN) tablet 4 mg (has no administration in time range)  alum & mag  hydroxide-simeth (MAALOX/MYLANTA) 200-200-20 MG/5ML suspension 30 mL (has no administration in time range)  traZODone (DESYREL) tablet 25 mg (has no administration in time range)  albuterol (VENTOLIN HFA) 108 (90 Base) MCG/ACT inhaler 2 puff (has no administration in time range)  apixaban (ELIQUIS) tablet 5 mg (has no administration in time range)  atorvastatin (LIPITOR) tablet 80 mg (has no administration in time range)  empagliflozin (JARDIANCE) tablet 25 mg (has no administration in time range)  escitalopram (LEXAPRO) tablet 10 mg (has no administration in time range)  glipiZIDE (GLUCOTROL) tablet 5 mg (has no administration in time range)  hydrOXYzine (ATARAX) tablet 10 mg (has no administration in time range)  losartan (COZAAR) tablet 50 mg (has no administration in time range)  pregabalin (LYRICA) capsule 75 mg (has no administration in time range)  metoprolol tartrate (LOPRESSOR) tablet 25 mg (has no administration in time range)  torsemide (DEMADEX) tablet 20 mg (has no administration in time range)    ED Course/ Medical Decision Making/ A&P                                 Medical Decision Making Amount and/or Complexity of Data Reviewed Labs: ordered.  Risk OTC drugs. Prescription drug management.   47 y.o. female with pertinent past medical history of CVA with resulting left-sided deficits, NSTEMI, acute on chronic systolic CHF, nonischemic cardiomyopathy, left ventricular thrombus, type 2 diabetes, anemia, atrial fibrillation  presents to the ED for concern of IVC'd by family due to suicidal ideation  Differential diagnosis includes but is not limited to major depressive disorder, psychosis, suicidal ideation  ED Course:  Patient brought to the ER by family due to concern for the patient's suicidal ideation.  She does have a IVC in place by the family.  Patient denies any complaints, denies any suicidal ideation, homicidal ideation, auditory or visual  hallucinations.  She  is well-appearing and does not appear to be interacting with any internal stimuli.  Not agitated and cooperative.  CMP and CBC overall unremarkable, patient medically cleared at this time.  TTS consult placed and home medications ordered.   Impression: Suicidal ideation per family IVC  Disposition:  TTS consult placed  Lab Tests: I Ordered, and personally interpreted labs.  The pertinent results include:   CBC with elevations in hemoglobin and RBCs CMP with slight hyponatremia at 132, slight elevation in alk phos at 151, no elevation in AST or ALT   Consultations Obtained: I requested consultation with the TTS for psychiatric care     Co morbidities that complicate the patient evaluation   CVA with resulting left-sided deficits, NSTEMI, acute on chronic systolic CHF, nonischemic cardiomyopathy, left ventricular thrombus, type 2 diabetes, anemia, atrial fibrillation               Final Clinical Impression(s) / ED Diagnoses Final diagnoses:  Suicidal ideation    Rx / DC Orders ED Discharge Orders     None         Arabella Merles, PA-C 04/04/23 2119    Anders Simmonds T, DO 04/05/23 1503

## 2023-04-04 NOTE — Progress Notes (Signed)
Subjective:   [name] is a 47 y.o. female who presents for an Initial Medicare Annual Wellness Visit.  Visit Complete: Virtual  I connected with  [name] on 04/04/23 by a audio enabled telemedicine application and verified that I am speaking with the correct person using two identifiers.  Patient Location: Home  Provider Location: Home Office  I discussed the limitations of evaluation and management by telemedicine. The patient expressed understanding and agreed to proceed.  Vital Signs: Unable to obtain new vitals due to this being a telehealth visit.   Review of Systems     Cardiac Risk Factors include: advanced age (>21men, >38 women);diabetes mellitus;sedentary lifestyle     Objective:    Today's Vitals   04/04/23 1014  PainSc: 5    There is no height or weight on file to calculate BMI.     04/04/2023   10:29 AM 03/08/2023    2:44 PM 01/14/2023    7:57 PM 11/28/2022    1:54 PM 11/19/2022   12:00 AM 11/11/2022   11:37 AM 11/09/2022   10:23 AM  Advanced Directives  Does Patient Have a Medical Advance Directive? No No No No No No No  Would patient like information on creating a medical advance directive? No - Patient declined No - Patient declined  No - Patient declined No - Patient declined No - Patient declined No - Patient declined    Current Medications (verified) Outpatient Encounter Medications as of 04/04/2023  Medication Sig   Accu-Chek Softclix Lancets lancets USE 1  TO CHECK GLUCOSE 4 TIMES DAILY   acetaminophen (TYLENOL) 500 MG tablet Take 500-1,000 mg by mouth every 6 (six) hours as needed for mild pain or headache.   albuterol (VENTOLIN HFA) 108 (90 Base) MCG/ACT inhaler Inhale 2 puffs into the lungs every 6 (six) hours as needed for wheezing or shortness of breath.   amantadine (SYMMETREL) 100 MG capsule Take 2 capsules (200 mg total) by mouth daily.   apixaban (ELIQUIS) 5 MG TABS tablet Take 1 tablet (5 mg total) by mouth 2 (two) times daily.    apixaban (ELIQUIS) 5 MG TABS tablet Take 1 tablet (5 mg total) by mouth 2 (two) times daily.   atorvastatin (LIPITOR) 80 MG tablet Take 1 tablet (80 mg total) by mouth daily after supper.   blood glucose meter kit and supplies Use up to 4 (four) times daily as directed   Blood Glucose Monitoring Suppl (ACCU-CHEK GUIDE) w/Device KIT USE 1 UP TO 4 TIMES DAILY   empagliflozin (JARDIANCE) 25 MG TABS tablet Take 1 tablet (25 mg total) by mouth daily.   escitalopram (LEXAPRO) 10 MG tablet Take 1 tablet (10 mg total) by mouth at bedtime.   glipiZIDE (GLUCOTROL) 5 MG tablet Take 1 tablet (5 mg total) by mouth daily before breakfast.   glucose blood (ACCU-CHEK GUIDE) test strip USE  4 TIMES DAILY   HYDROcodone-acetaminophen (NORCO/VICODIN) 5-325 MG tablet Take 1 tablet by mouth 3 (three) times daily as needed for pain   hydrOXYzine (ATARAX) 10 MG tablet Take 1 tablet (10 mg total) by mouth 3 (three) times daily as needed.   losartan (COZAAR) 50 MG tablet Take 1 tablet (50 mg total) by mouth daily.   megestrol (MEGACE) 400 MG/10ML suspension Take 10 mLs (400 mg total) by mouth 2 (two) times daily.   melatonin 5 MG TABS Take 1 tablet (5 mg total) by mouth at bedtime.   methocarbamol (ROBAXIN) 500 MG tablet Take 1  tablet (500 mg total) by mouth every 6 (six) hours as needed for muscle spasms. (Patient taking differently: Take 500 mg by mouth daily.)   metoprolol tartrate (LOPRESSOR) 25 MG tablet Take 1 tablet (25 mg total) by mouth 2 (two) times daily.   pregabalin (LYRICA) 75 MG capsule Take 1 capsule (75 mg total) by mouth 2 (two) times daily as needed for pain.   spironolactone (ALDACTONE) 25 MG tablet Take 1 tablet (25 mg total) by mouth daily.   torsemide (DEMADEX) 20 MG tablet Take 1 tablet (20 mg total) by mouth daily.   traZODone (DESYREL) 50 MG tablet Take 1/2 tablet (25 mg total) by mouth at bedtime. (Patient not taking: Reported on 04/04/2023)   Vitamin D, Ergocalciferol, (DRISDOL) 1.25 MG (50000  UNIT) CAPS capsule Take 1 capsule (50,000 Units total) by mouth every 7 (seven) days.   oxyCODONE (OXY IR/ROXICODONE) 5 MG immediate release tablet Take 1 tablet (5 mg total) by mouth daily as needed for severe pain. (Patient not taking: Reported on 04/04/2023)   oxyCODONE (OXY IR/ROXICODONE) 5 MG immediate release tablet Take 1 tablet (5 mg total) by mouth every 4 (four) hours as needed for pain (Patient not taking: Reported on 04/04/2023)   oxyCODONE (OXY IR/ROXICODONE) 5 MG immediate release tablet Take 1 tablet (5 mg total) by mouth every 4 (four) hours as needed for pain. (Patient not taking: Reported on 04/04/2023)   senna-docusate (SENOKOT-S) 8.6-50 MG tablet Take 3 tablets by mouth daily with lunch. (Patient not taking: Reported on 03/16/2023)   [DISCONTINUED] carvedilol (COREG) 3.125 MG tablet Take 1 tablet (3.125 mg total) by mouth 2 (two) times daily with a meal.   No facility-administered encounter medications on file as of 04/04/2023.    Allergies (verified) Fish allergy, Shellfish allergy, Metformin and related, Nsaids, and Trulicity [dulaglutide]   History: Past Medical History:  Diagnosis Date   Abnormal Pap smear    Acute combined systolic and diastolic heart failure (HCC) 02/19/2019   Wt Readings from Last 3 Encounters:  09/18/21  84.7 kg  09/02/21  84.4 kg  08/31/21  84.2 kg         Acute heart failure (HCC) 01/29/2022   Acute on chronic combined systolic and diastolic CHF (congestive heart failure) (HCC) 02/19/2019   Acute on chronic congestive heart failure (HCC)    Acute on chronic congestive heart failure (HCC)    Acute on chronic diastolic CHF (congestive heart failure) (HCC) 10/19/2021   Acute on chronic systolic heart failure (HCC) 02/22/2022   Anemia    Asthma    Atrial fibrillation (HCC)    history of paroxysmal atrial fibrillation   CHF (congestive heart failure) (HCC) 11/02/2021   CHF exacerbation (HCC) 10/17/2021   Diabetes mellitus    DKA, type 2 (HCC)  02/06/2019   Elevated troponin 09/30/2019   Heart failure with reduced ejection fraction (HCC)    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     Migraines    Morbid obesity (HCC) 02/06/2019   Nausea & vomiting 06/26/2022   Nonischemic cardiomyopathy (HCC) 09/22/2021   NSTEMI (non-ST elevated myocardial infarction) (HCC) 02/23/2022  Obstructive sleep apnea 06/30/2015   Polycystic ovarian disease    Past Surgical History:  Procedure Laterality Date   BIOPSY  11/05/2021   Procedure: BIOPSY;  Surgeon: Jenel Lucks, MD;  Location: [facility] ENDOSCOPY;  Service: Gastroenterology;;   CERVIX LESION DESTRUCTION     CESAREAN SECTION     ESOPHAGOGASTRODUODENOSCOPY (EGD) WITH PROPOFOL N/A 11/05/2021   Procedure: ESOPHAGOGASTRODUODENOSCOPY (EGD) WITH PROPOFOL;  Surgeon: Jenel Lucks, MD;  Location: [facility] ENDOSCOPY;  Service: Gastroenterology;  Laterality: N/A;   IR CT HEAD LTD  09/24/2022   IR PERCUTANEOUS ART THROMBECTOMY/INFUSION INTRACRANIAL INC DIAG ANGIO  09/24/2022   RADIOLOGY WITH ANESTHESIA N/A 09/24/2022   Procedure: IR WITH ANESTHESIA;  Surgeon: Radiologist, Medication, MD;  Location: [facility] OR;  Service: Radiology;  Laterality: N/A;   RIGHT/LEFT HEART CATH AND CORONARY ANGIOGRAPHY N/A 03/29/2019   Procedure: RIGHT/LEFT HEART CATH AND CORONARY ANGIOGRAPHY;  Surgeon: Laurey Morale, MD;  Location: [facility] INVASIVE CV LAB;  Service: Cardiovascular;  Laterality: N/A;   RIGHT/LEFT HEART CATH AND CORONARY ANGIOGRAPHY N/A 09/20/2021   Procedure: RIGHT/LEFT HEART CATH AND CORONARY ANGIOGRAPHY;  Surgeon: Laurey Morale, MD;  Location: [facility] INVASIVE CV  LAB;  Service: Cardiovascular;  Laterality: N/A;   Family History  Problem Relation Age of Onset   Diabetes Mother    Hypertension Mother    Hypertension Father    Diabetes Father    Social History   Socioeconomic History   Marital status: Single    Spouse name: Not on file   Number of children: Not on file   Years of education: Not on file   Highest education level: Not on file  Occupational History   Not on file  Tobacco Use   Smoking status: Never   Smokeless tobacco: Never   Tobacco comments:    Never smoke 10/22/21  Vaping Use   Vaping status: Never Used  Substance and Sexual Activity   Alcohol use: No    Alcohol/week: 0.0 standard drinks of alcohol   Drug use: No   Sexual activity: Not Currently    Birth control/protection: None  Other Topics Concern   Not on file  Social History Narrative   Not on file   Social Determinants of Health   Financial Resource Strain: Low Risk  (04/04/2023)   Overall Financial Resource Strain (CARDIA)    Difficulty of Paying Living Expenses: Not very hard  Food Insecurity: No Food Insecurity (04/04/2023)   Hunger Vital Sign    Worried About Running Out of Food in the Last Year: Never true    Ran Out of Food in the Last Year: Never true  Transportation Needs: No Transportation Needs (04/04/2023)   PRAPARE - Administrator, Civil Service (Medical): No    Lack of Transportation (Non-Medical): No  Physical Activity: Inactive (04/04/2023)   Exercise Vital Sign    Days of Exercise per Week: 0 days    Minutes of Exercise per Session: 0 min  Stress: Stress Concern Present (04/04/2023)   [employer] of Occupational Health - Occupational Stress Questionnaire    Feeling of Stress : Very much  Social Connections: Socially Isolated (04/04/2023)   Social Connection and Isolation Panel [NHANES]    Frequency of Communication with Friends and Family: Once a week    Frequency of Social Gatherings with Friends and Family: Never     Attends Religious Services: Never    Database administrator or Organizations: No    Attends Banker Meetings: Never  Marital Status: Never married    Tobacco Counseling Counseling given: Not Answered Tobacco comments: Never smoke 10/22/21   Clinical Intake:     Pain : 0-10 Pain Score: 5  Pain Type: Chronic pain Pain Location: Back (legs back and arms) Pain Descriptors / Indicators: Burning, Constant, Aching Pain Onset: More than a month ago Pain Frequency: Constant     Diabetes: Yes CBG done?: No Did pt. bring in CBG monitor from home?: No  How often do you need to have someone help you when you read instructions, pamphlets, or other written materials from your doctor or pharmacy?: 1 - Never  Interpreter Needed?: No  Information entered by :: Remi Haggard LPN   Activities of Daily Living    04/04/2023   10:31 AM 11/19/2022   12:00 AM  In your present state of health, do you have any difficulty performing the following activities:  Hearing?  0  Vision? 0 0  Difficulty concentrating or making decisions? 1 0  Walking or climbing stairs? 1 1  Dressing or bathing? 1 1  Doing errands, shopping? 1 0  Preparing Food and eating ? Y   Using the Toilet? Y   In the past six months, have you accidently leaked urine? Y   Do you have problems with loss of bowel control? N   Managing your Medications? Y   Managing your Finances? Y   Housekeeping or managing your Housekeeping? Y     Patient Care Team: Levin Erp, MD as PCP - General (Family Medicine) Regan Lemming, MD as PCP - Electrophysiology (Cardiology) Maisie Fus, MD as PCP - Cardiology (Cardiology) Burna Sis, LCSW as Social Worker (Licensed Clinical Social Worker)  Indicate any recent [employer] you may have received from other than [facility] providers in the past year (date may be approximate).     Assessment:   This is a routine wellness examination for  [place].  Hearing/Vision screen Hearing Screening - Comments:: No trouble hearing Vision Screening - Comments:: Not up to date    Goals Addressed             This Visit's Progress    Patient Stated       Patient would like to be living in her home alone       Depression Screen    04/04/2023   10:32 AM 02/21/2023   10:45 AM 11/28/2022    1:57 PM 11/11/2022   11:37 AM 11/09/2022   10:24 AM 08/05/2022    9:39 AM 05/09/2022   10:30 AM  PHQ 2/9 Scores  PHQ - 2 Score 6 4    6    PHQ- 9 Score 16 11    15    Exception Documentation   Patient refusal Patient refusal Patient refusal Patient refusal Patient refusal    Fall Risk    04/04/2023   10:49 AM 02/21/2023   10:45 AM 01/19/2022    3:07 PM 12/11/2019   10:07 AM 08/22/2019   10:02 AM  Fall Risk   Falls in the past year? 0 1 0 0 0  Number falls in past yr: 0 1 0 0 0  Injury with Fall? 0 0 0  0  Risk for fall due to : History of fall(s);Impaired mobility;Impaired balance/gait      Follow up Falls evaluation completed;Education provided;Falls prevention discussed   Falls evaluation completed     MEDICARE RISK AT HOME: Medicare Risk at Home Any stairs in or around the home?: No If  so, are there any without handrails?: No Home free of loose throw rugs in walkways, pet beds, electrical cords, etc?: Yes Adequate lighting in your home to reduce risk of falls?: Yes Life alert?: No Use of a cane, walker or w/c?: Yes Grab bars in the bathroom?: Yes Shower chair or bench in shower?: Yes Elevated toilet seat or a handicapped toilet?: Yes  TIMED UP AND GO:  Was the test performed? No    Cognitive Function:        04/04/2023   10:29 AM  6CIT Screen  What Year? 4 points  What month? 0 points  What time? 3 points  Count back from 20 4 points  Months in reverse 4 points  Repeat phrase 10 points  Total Score 25 points    Immunizations  There is no immunization history on file for this patient.  TDAP status: Due,  Education has been provided regarding the importance of this vaccine. Advised may receive this vaccine at local pharmacy or Health Dept. Aware to provide a copy of the vaccination record if obtained from local pharmacy or Health Dept. Verbalized acceptance and understanding.  Flu Vaccine status: Due, Education has been provided regarding the importance of this vaccine. Advised may receive this vaccine at local pharmacy or Health Dept. Aware to provide a copy of the vaccination record if obtained from local pharmacy or Health Dept. Verbalized acceptance and understanding.    Covid-19 vaccine status: Information provided on how to obtain vaccines.   Qualifies for Shingles Vaccine? No   Zostavax completed No   Shingrix Completed?: No.    Education has been provided regarding the importance of this vaccine. Patient has been advised to call insurance company to determine out of pocket expense if they have not yet received this vaccine. Advised may also receive vaccine at local pharmacy or Health Dept. Verbalized acceptance and understanding.  Screening Tests Health Maintenance  Topic Date Due   OPHTHALMOLOGY EXAM  Never done   Hepatitis C Screening  Never done   DTaP/Tdap/Td (1 - Tdap) Never done   PAP SMEAR-Modifier  02/03/2015   Diabetic kidney evaluation - Urine ACR  03/12/2020   FOOT EXAM  08/21/2020   Colonoscopy  Never done   INFLUENZA VACCINE  Never done   COVID-19 Vaccine (1 - 2023-24 season) Never done   HEMOGLOBIN A1C  06/30/2023   Diabetic kidney evaluation - eGFR measurement  03/07/2024   Medicare Annual Wellness (AWV)  04/03/2024   HIV Screening  Completed   HPV VACCINES  Aged Out    Health Maintenance  Health Maintenance Due  Topic Date Due   OPHTHALMOLOGY EXAM  Never done   Hepatitis C Screening  Never done   DTaP/Tdap/Td (1 - Tdap) Never done   PAP SMEAR-Modifier  02/03/2015   Diabetic kidney evaluation - Urine ACR  03/12/2020   FOOT EXAM  08/21/2020   Colonoscopy   Never done   INFLUENZA VACCINE  Never done   COVID-19 Vaccine (1 - 2023-24 season) Never done   Mammogram  never done    Lung Cancer Screening: (Low Dose CT Chest recommended if Age 31-80 years, 20 pack-year currently smoking OR have quit w/in 15years.) does not qualify.   Lung Cancer Screening Referral:   Additional Screening:  Hepatitis C Screening: does not qualify;   Vision Screening: Recommended annual ophthalmology exams for early detection of glaucoma and other disorders of the eye. Is the patient up to date with their annual eye exam?  No  Who is the provider or what is the name of the office in which the patient attends annual eye exams? Will call to schedule If pt is not established with a provider, would they like to be referred to a provider to establish care? No .   Dental Screening: Recommended annual dental exams for proper oral hygiene  Diabetic Foot Exam: Nutrition Risk Assessment:  Has the patient had any N/V/D within the last 2 months?  No  Does the patient have any non-healing wounds?  No  Has the patient had any unintentional weight loss or weight gain?  No   Diabetes:  Is the patient diabetic?  Yes  If diabetic, was a CBG obtained today?  No  Did the patient bring in their glucometer from home?  No  How often do you monitor your CBG's? Does not check.   Financial Strains and Diabetes Management:  Are you having any financial strains with the device, your supplies or your medication? No .  Does the patient want to be seen by Chronic Care Management for management of their diabetes?  No  Would the patient like to be referred to a Nutritionist or for Diabetic Management?  No   Diabetic Exams:  Diabetic Eye Exam: . Overdue for diabetic eye exam. Pt has been advised about the importance in completing this exam     Community Resource Referral / Chronic Care Management: CRR required this visit?  No   CCM required this visit?  No     Plan:     I  have personally reviewed and noted the following in the patient's chart:   Medical and social history Use of alcohol, tobacco or illicit drugs  Current medications and supplements including opioid prescriptions. Patient is currently taking opioid prescriptions. Information provided to patient regarding non-opioid alternatives. Patient advised to discuss non-opioid treatment plan with their provider. Functional ability and status Nutritional status Physical activity Advanced directives List of other physicians Hospitalizations, surgeries, and ER visits in previous 12 months Vitals Screenings to include cognitive, depression, and falls Referrals and appointments  In addition, I have reviewed and discussed with patient certain preventive protocols, quality metrics, and best practice recommendations. A written personalized care plan for preventive services as well as general preventive health recommendations were provided to patient.     Remi Haggard, LPN   02/06/9677   After Visit Summary: (MyChart) Due to this being a telephonic visit, the after visit summary with patients personalized plan was offered to patient via MyChart   Nurse Notes: patient states she is having issues with her oxycodone for pain level and her Trazadone was told to cut in half , but states she has been taking a whole tablet and now has non left .    I did review notes from visit that did discuss these medications advised patient that it states she was to be seen monthly for refills .    Patient is very upset with her living situation. 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         A telephone message was sent to Levin Erp with concerns.

## 2023-04-05 DIAGNOSIS — F331 Major depressive disorder, recurrent, moderate: Secondary | ICD-10-CM | POA: Diagnosis present

## 2023-04-05 NOTE — BH Assessment (Signed)
TTS consult will be completed by IRIS telehealth. The IRIS Coordinator will reach out in secure chat, which is already initiated, with IRIS provider that will be completing assessment and the assessment time.

## 2023-04-05 NOTE — ED Notes (Signed)
Pt have  been given a sandwich and a drink.

## 2023-04-05 NOTE — Progress Notes (Signed)
[facility] Consult Note  Patient Name: [name] MRN: [id number] DOB: July 24, 1976 DATE OF Consult: 04/05/2023  PRIMARY PSYCHIATRIC DIAGNOSES  1.  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 Agree with higher dose to Trazodone ( 100 mg at bedtime)  Non-Medication/therapeutic recommendations: PLEASE REFER PATIENT TO OUTPATIENT PSYCHIATRY FOR FURTHER CARE. Is inpatient psychiatric hospitalization recommended for this patient? No (Explain why): no indication. Patient is currently frustrated with her health and made statement she would stop taking her meds. She denies plan or intent of actually doing anything to hurt herself. From a psychiatric perspective, is this patient appropriate for discharge to an outpatient setting/resource or other less restrictive environment for continued care?  Yes (Explain why): with referral to outpatient services  Follow-Up Telepsychiatry C/L services: We will sign off for now. Please re-consult our service if needed for any concerning changes in the patient's condition, discharge planning, or questions. Communication: Treatment team members (and family members if applicable) who were involved in treatment/care discussions and planning, and with whom we spoke or engaged with via secure text/chat, include the following: RN   Thank you for involving [place] in the care of this patient. If you have any additional questions or concerns, please call [phone] and ask for me or the provider on-call.  TELEPSYCHIATRY ATTESTATION & CONSENT  As the provider for this telehealth consult, I attest that I verified the patient's identity using two separate identifiers, introduced myself to the patient, provided my credentials, disclosed my location, and performed this encounter via a HIPAA-compliant, real-time, face-to-face, two-way, interactive audio and video platform and  with the full consent and agreement of the patient (or guardian as applicable.)  Patient physical location: [facility] Department at [facility] . Telehealth provider physical location: home office in state of [place].  Video start time: 2 am CST (Central Time) Video end time: 2:25 am CST (Central Time)  IDENTIFYING DATA  [name] is a 47 y.o. year-old female for whom a psychiatric consultation has been ordered by the primary provider. The patient was identified using two separate identifiers.  CHIEF COMPLAINT/REASON FOR CONSULT  Presented with IVC after saying she would stop taking her meds.   HISTORY OF PRESENT ILLNESS (HPI)  The patient is a 47 yo female with multiple medical issues. She had a stroke in March of this issues and currently has decompensated heart failure.  She is frustrated with her physical decline, with having to learn to walk again and "peeing herself" all the time because of the diuretics.  She lives with her father and told him today that she wanted to stop taking her meds and wished she wouldn't wake up.  She says that the " mental health people" were called and she was honest with them but regrets it because she is now in the hospital.  She denies having the intention or plan of actually hurting herself and wants help. She is confused about the different between and therapist and a psychiatrist when I ask if she would be interested in seeing one. After clarification, she is accepting of a referal to both.   PAST PSYCHIATRIC HISTORY   Otherwise as per HPI above.  PAST MEDICAL HISTORY  Past Medical History:  Diagnosis Date   Abnormal Pap smear    Acute combined systolic and diastolic heart failure (HCC) 02/19/2019   Wt Readings from Last 3 Encounters:  09/18/21  84.7 kg  09/02/21  84.4 kg  08/31/21  84.2 kg         Acute heart failure (HCC) 01/29/2022   Acute on chronic combined systolic and diastolic CHF (congestive heart failure) (HCC)  02/19/2019   Acute on chronic congestive heart failure (HCC)    Acute on chronic congestive heart failure (HCC)    Acute on chronic diastolic CHF (congestive heart failure) (HCC) 10/19/2021   Acute on chronic systolic heart failure (HCC) 02/22/2022   Anemia    Asthma    Atrial fibrillation (HCC)    history of paroxysmal atrial fibrillation   CHF (congestive heart failure) (HCC) 11/02/2021   CHF exacerbation (HCC) 10/17/2021   Diabetes mellitus    DKA, type 2 (HCC) 02/06/2019   Elevated troponin 09/30/2019   Heart failure with reduced ejection fraction (HCC)    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     Migraines    Morbid obesity (HCC) 02/06/2019   Nausea & vomiting 06/26/2022   Nonischemic cardiomyopathy (HCC) 09/22/2021   NSTEMI (non-ST elevated myocardial infarction) (HCC) 02/23/2022   Obstructive sleep apnea 06/30/2015   Polycystic ovarian disease      HOME MEDICATIONS  Facility Ordered Medications  Medication   acetaminophen (TYLENOL) tablet 650 mg   ondansetron (ZOFRAN) tablet 4 mg   alum & mag hydroxide-simeth (MAALOX/MYLANTA) 200-200-20 MG/5ML suspension 30 mL   traZODone (DESYREL) tablet 25 mg   albuterol (VENTOLIN HFA) 108 (90 Base) MCG/ACT inhaler 2 puff   apixaban (ELIQUIS) tablet 5 mg   atorvastatin (LIPITOR) tablet 80 mg   empagliflozin (JARDIANCE) tablet 25 mg   escitalopram (LEXAPRO) tablet 10 mg   glipiZIDE (GLUCOTROL) tablet 5 mg   hydrOXYzine (ATARAX) tablet 10 mg   losartan (COZAAR) tablet 50 mg   pregabalin (LYRICA) capsule 75 mg   metoprolol tartrate (LOPRESSOR) tablet 25 mg   torsemide  (DEMADEX) tablet 20 mg   PTA Medications  Medication Sig   acetaminophen (TYLENOL) 500 MG tablet Take 500-1,000 mg by mouth every 6 (six) hours as needed for mild pain or headache (OR CRAMPING).   metoprolol tartrate (LOPRESSOR) 25 MG tablet Take 1 tablet (25 mg total) by mouth 2 (two) times daily.   torsemide (DEMADEX) 20 MG tablet Take 1 tablet (20 mg total) by mouth daily.   empagliflozin (JARDIANCE) 25 MG TABS tablet Take 1 tablet (25 mg total) by mouth daily.   pregabalin (LYRICA) 75 MG capsule Take 1 capsule (75 mg total) by mouth 2 (two) times daily as needed for pain.   hydrOXYzine (ATARAX) 10 MG tablet Take 1 tablet (10 mg total) by mouth 3 (three) times daily as needed. (Patient taking differently: Take 10 mg by mouth 3 (three) times daily as needed for anxiety.)   HYDROcodone-acetaminophen (NORCO/VICODIN) 5-325 MG tablet Take 1 tablet by mouth 3 (three) times daily as needed for pain   apixaban (ELIQUIS) 5 MG TABS tablet Take 1 tablet (5 mg total) by mouth 2 (two) times daily.   escitalopram (LEXAPRO) 10 MG tablet Take 1 tablet (10 mg total) by mouth at bedtime.   amantadine (SYMMETREL) 100 MG capsule Take 2 capsules (200 mg total)  by mouth daily.   glipiZIDE (GLUCOTROL) 5 MG tablet Take 1 tablet (5 mg total) by mouth daily before breakfast.   traZODone (DESYREL) 50 MG tablet Take 1/2 tablet (25 mg total) by mouth at bedtime. (Patient taking differently: Take 50-100 mg by mouth at bedtime.)   blood glucose meter kit and supplies Use up to 4 (four) times daily as directed   senna-docusate (SENOKOT-S) 8.6-50 MG tablet Take 3 tablets by mouth daily with lunch. (Patient not taking: Reported on 04/04/2023)   melatonin 5 MG TABS Take 1 tablet (5 mg total) by mouth at bedtime.   atorvastatin (LIPITOR) 80 MG tablet Take 1 tablet (80 mg total) by mouth daily after supper. (Patient not taking: Reported on 04/04/2023)   Vitamin D, Ergocalciferol, (DRISDOL) 1.25 MG (50000 UNIT) CAPS capsule Take 1  capsule (50,000 Units total) by mouth every 7 (seven) days. (Patient not taking: Reported on 04/04/2023)   methocarbamol (ROBAXIN) 500 MG tablet Take 1 tablet (500 mg total) by mouth every 6 (six) hours as needed for muscle spasms. (Patient not taking: Reported on 04/04/2023)   megestrol (MEGACE) 400 MG/10ML suspension Take 10 mLs (400 mg total) by mouth 2 (two) times daily. (Patient not taking: Reported on 04/04/2023)   oxyCODONE (OXY IR/ROXICODONE) 5 MG immediate release tablet Take 1 tablet (5 mg total) by mouth daily as needed for severe pain. (Patient not taking: Reported on 04/04/2023)   glucose blood (ACCU-CHEK GUIDE) test strip USE  4 TIMES DAILY   Blood Glucose Monitoring Suppl (ACCU-CHEK GUIDE) w/Device KIT USE 1 UP TO 4 TIMES DAILY   Accu-Chek Softclix Lancets lancets USE 1  TO CHECK GLUCOSE 4 TIMES DAILY   losartan (COZAAR) 50 MG tablet Take 1 tablet (50 mg total) by mouth daily.   albuterol (VENTOLIN HFA) 108 (90 Base) MCG/ACT inhaler Inhale 2 puffs into the lungs every 6 (six) hours as needed for wheezing or shortness of breath.   oxyCODONE (OXY IR/ROXICODONE) 5 MG immediate release tablet Take 1 tablet (5 mg total) by mouth every 4 (four) hours as needed for pain (Patient not taking: Reported on 04/04/2023)   oxyCODONE (OXY IR/ROXICODONE) 5 MG immediate release tablet Take 1 tablet (5 mg total) by mouth every 4 (four) hours as needed for pain. (Patient not taking: Reported on 04/04/2023)   apixaban (ELIQUIS) 5 MG TABS tablet Take 1 tablet (5 mg total) by mouth 2 (two) times daily. (Patient not taking: Reported on 04/04/2023)   spironolactone (ALDACTONE) 25 MG tablet Take 1 tablet (25 mg total) by mouth daily.     ALLERGIES  Allergies  Allergen Reactions   Fish Allergy Swelling and Other (See Comments)    Can tolerate flounder, perch, and trout   Shellfish Allergy Anaphylaxis, Swelling and Other (See Comments)    Airway involvement including EMS - Has Epi-Pen   Metformin And Related  Diarrhea   Nsaids Other (See Comments)    Per PCP, interferes with daily meds (heart failure)   Trulicity [Dulaglutide] Nausea And Vomiting and Other (See Comments)    Multiple episodes of vomiting over multiple days    SOCIAL & SUBSTANCE USE HISTORY  Social History   Socioeconomic History   Marital status: Single    Spouse name: Not on file   Number of children: Not on file   Years of education: Not on file   Highest education level: Not on file  Occupational History   Not on file  Tobacco Use   Smoking status: Never  Smokeless tobacco: Never   Tobacco comments:    Never smoke 10/22/21  Vaping Use   Vaping status: Never Used  Substance and Sexual Activity   Alcohol use: No    Alcohol/week: 0.0 standard drinks of alcohol   Drug use: No   Sexual activity: Not Currently    Birth control/protection: None  Other Topics Concern   Not on file  Social History Narrative   Not on file   Social Determinants of Health   Financial Resource Strain: Low Risk  (04/04/2023)   Overall Financial Resource Strain (CARDIA)    Difficulty of Paying Living Expenses: Not very hard  Food Insecurity: No Food Insecurity (04/04/2023)   Hunger Vital Sign    Worried About Running Out of Food in the Last Year: Never true    Ran Out of Food in the Last Year: Never true  Transportation Needs: No Transportation Needs (04/04/2023)   PRAPARE - Administrator, Civil Service (Medical): No    Lack of Transportation (Non-Medical): No  Physical Activity: Inactive (04/04/2023)   Exercise Vital Sign    Days of Exercise per Week: 0 days    Minutes of Exercise per Session: 0 min  Stress: Stress Concern Present (04/04/2023)   [employer] of Occupational Health - Occupational Stress Questionnaire    Feeling of Stress : Very much  Social Connections: Socially Isolated (04/04/2023)   Social Connection and Isolation Panel [NHANES]    Frequency of Communication with Friends and Family: Once a week     Frequency of Social Gatherings with Friends and Family: Never    Attends Religious Services: Never    Database administrator or Organizations: No    Attends Engineer, structural: Never    Marital Status: Never married   Social History   Tobacco Use  Smoking Status Never  Smokeless Tobacco Never  Tobacco Comments   Never smoke 10/22/21   Social History   Substance and Sexual Activity  Alcohol Use No   Alcohol/week: 0.0 standard drinks of alcohol   Social History   Substance and Sexual Activity  Drug Use No    Additional pertinent information lives with dad. On Disability .  FAMILY HISTORY  Family History  Problem Relation Age of Onset   Diabetes Mother    Hypertension Mother    Hypertension Father    Diabetes Father      MENTAL STATUS EXAM (MSE)  Presentation  General Appearance:  Appropriate for Environment  Eye Contact: Good  Speech: Clear and Coherent  Speech Volume: Normal  Handedness:No data recorded  Mood and Affect  Mood: Anxious  Affect: Appropriate   Thought Process  Thought Processes: Coherent  Descriptions of Associations: Intact  Orientation: Full (Time, Place and Person)  Thought Content: Logical  History of Schizophrenia/Schizoaffective disorder:No data recorded Duration of Psychotic Symptoms:No data recorded Hallucinations: Hallucinations: None  Ideas of Reference: None  Suicidal Thoughts: Suicidal Thoughts: Yes, Passive SI Passive Intent and/or Plan: Without Intent; Without Plan; Without Means to Carry Out; Without Access to Means  Homicidal Thoughts: Homicidal Thoughts: No   Sensorium  Memory: Immediate Good  Judgment: Fair  Insight: Fair   Executive Functions  Concentration: Good  Attention Span: Good  Recall: Good  Fund of Knowledge: Good  Language: Good   Psychomotor Activity  Psychomotor Activity: Psychomotor Activity: Normal   Assets  Assets: Housing; Desire  for Improvement   Sleep  Sleep: Sleep: Fair   VITALS  Blood pressure (!) 124/91,  pulse 75, temperature 98.9 F (37.2 C), temperature source Oral, resp. rate 17, height 4\' 11"  (1.499 m), weight 75 kg, SpO2 100%.  LABS  Admission on 04/04/2023  Component Date Value Ref Range Status   WBC 04/04/2023 7.1  4.0 - 10.5 K/uL Final   RBC 04/04/2023 6.59 (H)  3.87 - 5.11 MIL/uL Final   Hemoglobin 04/04/2023 15.7 (H)  12.0 - 15.0 g/dL Final   HCT 16/04/9603 49.3 (H)  36.0 - 46.0 % Final   MCV 04/04/2023 74.8 (L)  80.0 - 100.0 fL Final   MCH 04/04/2023 23.8 (L)  26.0 - 34.0 pg Final   MCHC 04/04/2023 31.8  30.0 - 36.0 g/dL Final   RDW 54/03/8118 18.4 (H)  11.5 - 15.5 % Final   Platelets 04/04/2023 245  150 - 400 K/uL Final   nRBC 04/04/2023 0.0  0.0 - 0.2 % Final   Performed at [facility], [address], [place], [place] [address]   Sodium 04/04/2023 132 (L)  135 - 145 mmol/L Final   Potassium 04/04/2023 3.7  3.5 - 5.1 mmol/L Final   Chloride 04/04/2023 96 (L)  98 - 111 mmol/L Final   CO2 04/04/2023 23  22 - 32 mmol/L Final   Glucose, Bld 04/04/2023 154 (H)  70 - 99 mg/dL Final   Glucose reference range applies only to samples taken after fasting for at least 8 hours.   BUN 04/04/2023 20  6 - 20 mg/dL Final   Creatinine, Ser 04/04/2023 0.96  0.44 - 1.00 mg/dL Final   Calcium 95/62/1308 9.2  8.9 - 10.3 mg/dL Final   Total Protein 65/78/4696 8.4 (H)  6.5 - 8.1 g/dL Final   Albumin 29/52/8413 3.7  3.5 - 5.0 g/dL Final   AST 24/40/1027 28  15 - 41 U/L Final   ALT 04/04/2023 16  0 - 44 U/L Final   Alkaline Phosphatase 04/04/2023 151 (H)  38 - 126 U/L Final   Total Bilirubin 04/04/2023 2.3 (H)  0.3 - 1.2 mg/dL Final   GFR, Estimated 04/04/2023 >60  >60 mL/min Final   Comment: (NOTE) Calculated using the CKD-EPI Creatinine Equation (2021)    Anion gap 04/04/2023 13  5 - 15 Final   Performed at [facility], [address] [place], [place] [address]     PSYCHIATRIC REVIEW OF SYSTEMS (ROS)  ROS: Notable for the following relevant positive findings: ROS  Additional findings:      Musculoskeletal: Impaired      Gait & Station: Laying/Sitting      Pain Screening: Denies        RISK FORMULATION/ASSESSMENT  Is the patient experiencing any suicidal or homicidal ideations: No       Explain if yes: passive statements, no plan or intent. Protective factors considered for safety management: help seeking   Risk factors/concerns considered for safety management: chronic medical illness   Is there a safety management plan with the patient and treatment team to minimize risk factors and promote protective factors: Yes           Explain: referral to outpatient services  Is crisis care placement or psychiatric hospitalization recommended: No     Based on my current evaluation and risk assessment, patient is determined at this time to be at:  Low risk  *RISK ASSESSMENT Risk assessment is a dynamic process; it is possible that this patient's condition, and risk level, may change. This should be re-evaluated and managed over time as appropriate.  Please re-consult psychiatric consult services if additional assistance is needed in terms of risk assessment and management. If your team decides to discharge this patient, please advise the patient how to best access emergency psychiatric services, or to call 911, if their condition worsens or they feel unsafe in any way.   Dian Situ, MD Telepsychiatry Consult ServicesPatient ID: [name], female   DOB: May 30, 1976, 47 y.o.   MRN: [id number]

## 2023-04-05 NOTE — Progress Notes (Signed)
Per Dian Situ, MD Telepsychiatry Consult Services pt has been psych cleared. TOC consult if there is an identified need for discharge. This CSW will now remove pt from the TTS/BH shift report.   Maryjean Ka, MSW, [facility] 04/05/2023 3:33 AM

## 2023-04-05 NOTE — ED Provider Notes (Signed)
Patient has been evaluated by psychiatry and it is felt that she is safe for discharge.  Patient has contracted for safety and willing to perform outpatient follow-up.  Given resources.   Gilda Crease, MD 04/05/23 [phone]

## 2023-04-05 NOTE — ED Notes (Signed)
Patient's home medications in pill box given to Durojahye, CPhT to take to pharmacy.

## 2023-04-06 NOTE — Telephone Encounter (Signed)
Scheduled patient in office visit for 09/17 @250 

## 2023-04-10 ENCOUNTER — Telehealth: Payer: Self-pay

## 2023-04-10 ENCOUNTER — Other Ambulatory Visit (HOSPITAL_COMMUNITY): Payer: Self-pay

## 2023-04-10 MED ORDER — OXYCODONE HCL 5 MG PO TABS
5.0000 mg | ORAL_TABLET | ORAL | 0 refills | Status: DC | PRN
Start: 2023-04-10 — End: 2023-04-11
  Filled 2023-04-10: qty 28, 5d supply, fill #0

## 2023-04-10 NOTE — Telephone Encounter (Signed)
Arielle, PTA from [name] called to get verbal orders for 1 visit for social worker eval. Orders given

## 2023-04-11 ENCOUNTER — Other Ambulatory Visit: Payer: Self-pay

## 2023-04-11 ENCOUNTER — Encounter: Payer: Self-pay | Admitting: Student

## 2023-04-11 ENCOUNTER — Other Ambulatory Visit (HOSPITAL_COMMUNITY): Payer: Self-pay

## 2023-04-11 ENCOUNTER — Ambulatory Visit (INDEPENDENT_AMBULATORY_CARE_PROVIDER_SITE_OTHER): Payer: Medicare Other | Admitting: Student

## 2023-04-11 VITALS — BP 120/80 | HR 60 | Wt 121.8 lb

## 2023-04-11 DIAGNOSIS — R634 Abnormal weight loss: Secondary | ICD-10-CM | POA: Diagnosis not present

## 2023-04-11 DIAGNOSIS — F5101 Primary insomnia: Secondary | ICD-10-CM

## 2023-04-11 DIAGNOSIS — I63411 Cerebral infarction due to embolism of right middle cerebral artery: Secondary | ICD-10-CM

## 2023-04-11 DIAGNOSIS — M25512 Pain in left shoulder: Secondary | ICD-10-CM

## 2023-04-11 DIAGNOSIS — F0630 Mood disorder due to known physiological condition, unspecified: Secondary | ICD-10-CM | POA: Diagnosis not present

## 2023-04-11 DIAGNOSIS — I6601 Occlusion and stenosis of right middle cerebral artery: Secondary | ICD-10-CM

## 2023-04-11 MED ORDER — TRAZODONE HCL 50 MG PO TABS
50.0000 mg | ORAL_TABLET | Freq: Every day | ORAL | 0 refills | Status: DC
Start: 2023-04-11 — End: 2023-04-17
  Filled 2023-04-11: qty 30, 30d supply, fill #0

## 2023-04-11 MED ORDER — VITAMIN D 125 MCG (5000 UT) PO CAPS
5000.0000 [IU] | ORAL_CAPSULE | Freq: Every day | ORAL | 1 refills | Status: DC
Start: 2023-04-11 — End: 2023-04-17
  Filled 2023-04-11: qty 30, 30d supply, fill #0

## 2023-04-11 MED ORDER — ATORVASTATIN CALCIUM 80 MG PO TABS
80.0000 mg | ORAL_TABLET | Freq: Every day | ORAL | 0 refills | Status: DC
  Filled 2023-04-11: qty 30, 30d supply, fill #0

## 2023-04-11 NOTE — Assessment & Plan Note (Signed)
Recent ED visit for suicidal ideation.  Was cleared by psychiatry.  Overall doing okay now but still having difficulties coming to terms with major life changes at 47 years old. -Close monitoring in 1 month -Continue on Lexapro -States that 50 mg of trazodone has been which she has been taking and she would prefer this over taking 25 mg I think this is reasonable, Rx sent in today

## 2023-04-11 NOTE — Assessment & Plan Note (Signed)
Patient states that she has noticed weight loss since stroke. Was in 190s in March and now down to 121 pounds. States she is eating normally but does have noted protein calorie malnutrition in chart. No significant B symptoms. ? Muscle wasting from hemiplegia after CVA. Some weights are innacurate -- recent ED visit on 9/10 shows weight at 165 but patient states she was never weighed during that time. -1 month follow up for weight  -consider labs/additional workup for this at that time and see whether remaining stable or decreasing

## 2023-04-11 NOTE — Patient Instructions (Addendum)
It was great to see you! Thank you for allowing me to participate in your care!   Our plans for today:  - [facility] placed in [facility] call [facility] Address: [address], [place], [place] [address] Phone: [phone] - 1 month follow up for weight discussion - Placed home health and nursing/social work orders  Take care and seek immediate care sooner if you develop any concerns.  Levin Erp, MD

## 2023-04-11 NOTE — Progress Notes (Cosign Needed Addendum)
SUBJECTIVE:   CHIEF COMPLAINT / HPI: ED Follow up  Accompanied with her father in room   Patient had emergency department visit on 9/10 for suicidal ideation per family was IVC'd. Was evaluated by psychiatry and contracted for safety and discharged on 9/11 from the ED Since then denies any SI, she is mostly just frustrated about the amount of times that she is urinating daily due to her fluid pills.  She is going to set up an appointment with the cardiologist as well. States that she feels like she can't do much after stroke.  She requests Home health RN-she states that she needs help with bathing/dressing because of stroke and medication help.   She also needs a Child psychotherapist per patient PT coming to house to work with her and that is going okay per patient  She would be interested in medication pack for her multiple medications that she is on or something that is easier to open than her current medication pack  Also mentions that she believes she has had significant weight loss -- believes she was around 140-160 and now down to 121. States she is eating a good amount. Denies any fevers, night sweats.     04/04/2023   10:32 AM 02/21/2023   10:45 AM 08/05/2022    9:39 AM 01/19/2022    3:07 PM 10/22/2021    1:31 PM  Depression screen PHQ 2/9  Decreased Interest 3 2 3 1 1   Down, Depressed, Hopeless 3 2 3 2 2   PHQ - 2 Score 6 4 6 3 3   Altered sleeping 3 0 3 3 2   Tired, decreased energy 3 1 3 2 1   Change in appetite 0 0 3 2 1   Feeling bad or failure about yourself  2 3  2 1   Trouble concentrating 2 2  2  0  Moving slowly or fidgety/restless 0 0  0 1  Suicidal thoughts 0 1 0 1 1  PHQ-9 Score 16 11 15 15 10   Difficult doing work/chores Very difficult         PERTINENT  PMH / PSH: CVA with left-sided hemiparesis  OBJECTIVE:   BP 120/80   Pulse 60   Wt 121 lb 12.8 oz (55.2 kg)   SpO2 100%   BMI 24.60 kg/m   General: Chronically ill appearing, NAD, awake, alert, responsive to  questions Head: Normocephalic atraumatic  CV: Regular rate and rhythm  Respiratory: Clear to ausculation bilaterally, no wheezes rales or crackles, chest rises symmetrically,  no increased work of breathing Extremities: Left arm in sling, left sided hemiparesis from CVA  ASSESSMENT/PLAN:   Mood disorder due to a general medical condition Recent ED visit for suicidal ideation.  Was cleared by psychiatry.  Overall doing okay now but still having difficulties coming to terms with major life changes at 47 years old. -Close monitoring in 1 month -Continue on Lexapro -States that 50 mg of trazodone has been which she has been taking and she would prefer this over taking 25 mg I think this is reasonable, Rx sent in today  Middle cerebral artery embolism, right Placed home health referral for RN and social worker.  Weight loss, unintentional Patient states that she has noticed weight loss since stroke. Was in 190s in March and now down to 121 pounds. States she is eating normally but does have noted protein calorie malnutrition in chart. No significant B symptoms. ? Muscle wasting from hemiplegia after CVA. Some weights are innacurate -- recent  ED visit on 9/10 shows weight at 165 but patient states she was never weighed during that time. -1 month follow up for weight  -consider labs/additional workup for this at that time and see whether remaining stable or decreasing   Gave patient pill pack today for BID dosing-seems easier than her previous one Need to look into options for med packs-will message pharmacy if [facility] can do this Patient needs help with transportation to clinic appointments-will message patient with some resources for medicare/medicaid  Levin Erp, MD [facility]

## 2023-04-11 NOTE — Assessment & Plan Note (Signed)
Placed home health referral for RN and Child psychotherapist.

## 2023-04-12 ENCOUNTER — Other Ambulatory Visit (HOSPITAL_COMMUNITY): Payer: Self-pay

## 2023-04-13 NOTE — Progress Notes (Deleted)
Initial neurology clinic note  [name] MRN: [id number] DOB: 1975/09/26  Referring provider: Carlos Levering, NP  Primary care provider: Levin Erp, MD  Reason for consult:  Stroke  Subjective:  This is [name], a 47 y.o. ***-handed female with a medical history of HTN, HLD, DM, stroke, HFrEF, afib, CAD, OSA, migraines who presents to neurology clinic with ***. The patient is accompanied by ***.  ***  Patient had acute onset left sided weakness on 09/24/22 while admitted for SOB 2/2 CHF. She had LV thrombus as well. Per H&P from 09/24/22: This is a 48 year old patient admitted with shortness of breath secondary to acute combined CHF exacerbation and also found to have an LV thrombus on heparin drip until this morning on whom neurology is consulted for stroke code for left-sided weakness.  Other acute medical problems include acute on chronic combined systolic and diastolic heart failure, elevated troponin (elevated to 197 but trended down to 170, no EKG changes, felt to be likely demand ischemia secondary to CHF exacerbation), poorly controlled diabetes type 2 with last A1c 10.8, paroxysmal A-fib noncompliant with warfarin as an outpatient, hypertension.  Last known well was 12:40 PM, after that time patient developed left-sided weakness and right gaze deviation.  Rapid and then stroke code were activated.  CT head showed loss of gray-white differentiation in the right insula likely representing early infarct with an aspects of 9.  Patient was on heparin drip until this morning for LV thrombus which was shut off at the time the stroke code was called.  Therefore she was not a candidate for TNK.  NIH stroke scale was 16.  Patient has no deficits at baseline.  CT angiogram showed a proximal right M2 occlusion.   TICI2B revascularization was achieved. Per D/C summary from 10/01/22: She had a brief cardiac arrest during the procedure but was resuscitated after 2 minutes. She  was initially on heparin drip but now has been changed to Eliquis, which she will need to take indefinitely for cardiac thrombus and atrial fibrillation. She is doing well and is ready to be discharged to rehabilitation.  Stroke - right MCA infarct with right M2 occlusion, s/p IR with TICI2b, etiology likely embolic from known LV thrombus, cardiomyopathy with low EF and PAF even on heparin IV Code Stroke CT head: Loss of gray-white differentiation in the insula likely reflecting early infarct.  Aspects is 9 CTA head & neck:  Proximal right M2 occlusion with minimal collateral flow throughout the MCA distribution. IR - Occluded inferior division of the right middle cerebral artery in the mid M2 segment. TICI 2B revascularization.  Post IR CT: no evidence of intracranial hemorrhage.  MRI: Large area of acute infarct in the right MCA distribution, smaller area of acute infarct in the right ACA distribution, and punctate acute infarct in the left cerebellar hemisphere. No evidence of hemorrhagic transformation or midline shift. MRA head - Persistent occlusion of a right M2 branch with minimal collateral flow in the MCA distribution aside from in the region of the frontal operculum. CT repeat 3/4 stable right superior division MCA territory and distal right ACA territory infarcts with petechial hemorrhage. 2D Echo: Large LV apical thrombus present, LVEF 15% LDL 97 HgbA1c 10.8 VTE prophylaxis - SCDs, Eliquis warfarin daily prior to admission (?compliance), now on eliquis 5mg  BID (heparin off 3/8)   Neuro exam on discharge per D/C summary: NEURO:  Mental Status: AA&Ox3  Speech/Language: speech is with mild dysarthria   Cranial  Nerves:  II: PERRL.  III, IV, VI: Right gaze preference but can cross midline barely V: Sensation is intact to light touch and diminished on the left VII: Slight left facial droop VIII: hearing intact to voice. IX, X: Voice is quiet and slightly dysarthric XII: tongue is  midline without fasciculations. Motor: 5/5 strength to right upper and lower extremities, 0 out of 5 strength to left upper extremity, 1 out of 5 strength to left lower extremity Tone: is normal and bulk is normal Sensation- Intact to light touch bilaterally but diminished on the left Gait- deferred  Patient went to inpatient rehab from 10/01/22 to 11/01/22. Per d/c summary from 11/01/22: Brief HPI:   [name] is a 47 y.o. female with history of T2DM, NICM with chronic systolic/diastolic heart failure, PAF with LV thrombus was admitted on 09/22/2022 with reports of SOB and CP x 2 weeks with BLE edema.  CT chest was negative for PE and showed anasarca.  She was found to have elevated troponins with acute on chronic combined CHF and started on IV heparin as well as IV diuresis.  Cardiology consulted and felt that patient with demand ischemia due to CHF as EKG without ST changes.  Medications were adjusted to help manage fluid overload and patient was transitioned to Eliquis due to history of noncompliance with warfarin.  On afternoon on 03/02, she she developed acute onset of right gaze deviation with left hemiplegia.  CT head/neck was done revealing proximal right M2 occlusion with minimal flow throughout MCA division and she underwent cerebral angio with endovascular revascularization of occluded inferior division of the M2 segment of right MCA.  She did have brief cardiac arrest during IR procedure required 2 minutes of ACLS prior to ROSC.   2D echo done revealing large LV apical thrombus with severe reduction in LVEF and global hypokinesis with a EF of 15%.  MRI brain revealed large area of infarct right MCA distribution with small area in right ACA distribution and acute punctate infarct left cerebellar hemisphere.  Dr. Roda Shutters felt the stroke was likely embolic from LV thrombus with low EF and PAF even while on heparin.  Patient self extubated on 03/05 and respiratory status was stable.  Lethargy was slowly  resolving, core track was removed and diet was advanced to dysphagia 3 with thin liquids.  Therapy was working with patient who continued to be limited by left hemiplegia with left inattention, right gaze preference, decrease in balance, decreased awareness of deficits, impulsivity delay in processing and decreased ability to follow one-step commands.  CIR was recommended due to functional decline.    Hospital Course: [name] was admitted to rehab 10/01/2022 for inpatient therapies to consist of PT, ST and OT at least three hours five days a week. Past admission physiatrist, therapy team and rehab RN have worked together to provide customized collaborative inpatient rehab. She continues on Eliquis and follow up CBC shows H/H to be trending up due likely due to hemoconcentration. Her blood pressures were monitored on TID basis and has been stable. Serial labs were relatively stable till 04/08 when she was noted to have rise in BUN/SCr to 25/1.62. Her LOS was extended by a day for gentle hydration with IVF with follow up labs showing improvement in SCr to 1.42. Demadex, Spironolactone and Jardiance were held for 24 hours and recommend repeat BMET on follow up cardiology appointment the next day.  Vitamin D supplement was added due to vitamin D insufficiency.  She continues to report pain left side with shooting pains in arm and leg requiring use of MS IR as needed.  She also reports ongoing issues with spasms at night affecting overall sleep hygiene.  BLE dopplers done on 03/16 due to reports of BLE pain and were negative for DVT. Robaxin has been used to help manage spasms and melatonin was added to help with sleep hygiene.  She was noted to have depressed mood therefore Lexapro added for mood stabilization.  Team has been providing ego support as well as encouragement during his stay.  P.o. intake has varied due to dislike of hospital food and megace was added to help stimulate appetite.  Family has  provided additional food from home to help with intake.  Patient's blood sugars were monitored on ACHS basis and glipizide was added to help with blood sugar control.  Bowel program has been augmented to help manage constipation.   Amantadine was added for activation and to help with attention to tasks. She has developed increased tone due to left hemiplegia has been managed with splinting, range of motion as well as local measures including K-pad and Sportscreme.  She has been advised to continue using her resting splints at night. She continues to be limited by left sided weakness with sensory deficits. Family has been educated on managing patient at wheelchair level. Patient has been educated on safe swallow strategies as well as utilization of external and internal aids to assist with memory and recall. She will continue to receive follow up HHPT, HHOT, HHST and HHRN by [facility] after discharge.     Rehab course: During patient's stay in rehab weekly team conferences were held to monitor patient's progress, set goals and discuss barriers to discharge. At admission, patient required mod to max assist with ADL tasks and max assist with mobility. She exhibited mild dysarthria with decreased in vocal intenstiy and needed modified diet due to mild bolus holding with pocketing and decrease in bolus formation.  She  has had improvement in activity tolerance, balance, postural control as well as ability to compensate for deficits. She requires min assist for bathing tasks and for upper body dressing.  She requires mod assist for lower body dressing.  She requires min assist for transfers with tactile cues for sequencing with, weight shifting and for initiation.  She is able to ambulate 50 feet with max assist and use of a walker.  She continues to require supervision to min assist for cognition l.  She requires supervision at meals and has refused trials of regular textures.  She requested continuing  dysphagia 3 diet and was discharged on this per her preference.   Family education has been completed regarding all aspects of physical and cognitive assistance needed after discharge.  Family was also advised to manage and administer medications at discharge.   Disposition: Home.  On CPAP?*** Smoker?***  On eliquis and atorvastatin 80 mg***  MEDICATIONS:  Outpatient Encounter Medications as of 04/27/2023  Medication Sig Note   Accu-Chek Softclix Lancets lancets USE 1  TO CHECK GLUCOSE 4 TIMES DAILY    acetaminophen (TYLENOL) 500 MG tablet Take 500-1,000 mg by mouth every 6 (six) hours as needed for mild pain or headache (OR CRAMPING).    albuterol (VENTOLIN HFA) 108 (90 Base) MCG/ACT inhaler Inhale 2 puffs into the lungs every 6 (six) hours as needed for wheezing or shortness of breath.    amantadine (SYMMETREL) 100 MG capsule Take 2 capsules (200 mg total)  by mouth daily.    apixaban (ELIQUIS) 5 MG TABS tablet Take 1 tablet (5 mg total) by mouth 2 (two) times daily.    atorvastatin (LIPITOR) 80 MG tablet Take 1 tablet (80 mg total) by mouth daily after supper.    blood glucose meter kit and supplies Use up to 4 (four) times daily as directed    Blood Glucose Monitoring Suppl (ACCU-CHEK GUIDE) w/Device KIT USE 1 UP TO 4 TIMES DAILY    Cholecalciferol (VITAMIN D) 125 MCG (5000 UT) CAPS Take one capsule by mouth once daily.    empagliflozin (JARDIANCE) 25 MG TABS tablet Take 1 tablet (25 mg total) by mouth daily.    escitalopram (LEXAPRO) 10 MG tablet Take 1 tablet (10 mg total) by mouth at bedtime.    glipiZIDE (GLUCOTROL) 5 MG tablet Take 1 tablet (5 mg total) by mouth daily before breakfast.    glucose blood (ACCU-CHEK GUIDE) test strip USE  4 TIMES DAILY    hydrOXYzine (ATARAX) 10 MG tablet Take 1 tablet (10 mg total) by mouth 3 (three) times daily as needed. (Patient taking differently: Take 10 mg by mouth 3 (three) times daily as needed for anxiety.)    losartan (COZAAR) 50 MG tablet  Take 1 tablet (50 mg total) by mouth daily. 04/04/2023: Last filled 2 months ago for a 30-day's supply (I did not see it in the patient's pill tray from home)   megestrol (MEGACE) 400 MG/10ML suspension Take 10 mLs (400 mg total) by mouth 2 (two) times daily. (Patient not taking: Reported on 04/04/2023)    melatonin 5 MG TABS Take 1 tablet (5 mg total) by mouth at bedtime.    methocarbamol (ROBAXIN) 500 MG tablet Take 1 tablet (500 mg total) by mouth every 6 (six) hours as needed for muscle spasms. (Patient not taking: Reported on 04/04/2023)    metoprolol tartrate (LOPRESSOR) 25 MG tablet Take 1 tablet (25 mg total) by mouth 2 (two) times daily.    oxycodone (OXY-IR) 5 MG capsule Take 5 mg by mouth every 4 (four) hours as needed for pain.    pregabalin (LYRICA) 75 MG capsule Take 1 capsule (75 mg total) by mouth 2 (two) times daily as needed for pain.    spironolactone (ALDACTONE) 25 MG tablet Take 1 tablet (25 mg total) by mouth daily. 04/04/2023: This WAS in the patient's pill tray, but her father said she "won't take it"   torsemide (DEMADEX) 20 MG tablet Take 1 tablet (20 mg total) by mouth daily.    traZODone (DESYREL) 50 MG tablet Take 1 tablet (50 mg total) by mouth at bedtime.    [DISCONTINUED] carvedilol (COREG) 3.125 MG tablet Take 1 tablet (3.125 mg total) by mouth 2 (two) times daily with a meal. 07/01/2022: changed to Toprol XL   No facility-administered encounter medications on file as of 04/27/2023.    PAST MEDICAL HISTORY: Past Medical History:  Diagnosis Date   Abnormal Pap smear    Acute combined systolic and diastolic heart failure (HCC) 02/19/2019   Wt Readings from Last 3 Encounters:  09/18/21  84.7 kg  09/02/21  84.4 kg  08/31/21  84.2 kg         Acute heart failure (HCC) 01/29/2022   Acute on chronic combined systolic and diastolic CHF (congestive heart failure) (HCC) 02/19/2019   Acute on chronic congestive heart failure (HCC)    Acute on chronic congestive heart failure  (HCC)    Acute on chronic diastolic CHF (congestive heart  failure) (HCC) 10/19/2021   Acute on chronic systolic heart failure (HCC) 02/22/2022   Anemia    Asthma    Atrial fibrillation (HCC)    history of paroxysmal atrial fibrillation   CHF (congestive heart failure) (HCC) 11/02/2021   CHF exacerbation (HCC) 10/17/2021   Diabetes mellitus    DKA, type 2 (HCC) 02/06/2019   Elevated troponin 09/30/2019   Heart failure with reduced ejection fraction (HCC)    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     Migraines    Morbid obesity (HCC) 02/06/2019   Nausea & vomiting 06/26/2022   Nonischemic cardiomyopathy (HCC) 09/22/2021   NSTEMI (non-ST elevated myocardial infarction) (HCC) 02/23/2022   Obstructive sleep apnea 06/30/2015   Polycystic ovarian disease     PAST SURGICAL HISTORY: Past Surgical History:  Procedure Laterality Date   BIOPSY  11/05/2021   Procedure: BIOPSY;  Surgeon: Jenel Lucks, MD;  Location: [facility] ENDOSCOPY;  Service: Gastroenterology;;   CERVIX LESION DESTRUCTION     CESAREAN SECTION     ESOPHAGOGASTRODUODENOSCOPY (EGD) WITH PROPOFOL N/A 11/05/2021   Procedure: ESOPHAGOGASTRODUODENOSCOPY (EGD) WITH PROPOFOL;  Surgeon: Jenel Lucks, MD;  Location: [facility] ENDOSCOPY;  Service: Gastroenterology;  Laterality: N/A;   IR CT HEAD LTD  09/24/2022   IR PERCUTANEOUS ART THROMBECTOMY/INFUSION INTRACRANIAL INC DIAG ANGIO  09/24/2022   RADIOLOGY WITH ANESTHESIA N/A 09/24/2022   Procedure: IR WITH ANESTHESIA;  Surgeon: Radiologist, Medication, MD;  Location: [facility] OR;  Service: Radiology;  Laterality: N/A;   RIGHT/LEFT  HEART CATH AND CORONARY ANGIOGRAPHY N/A 03/29/2019   Procedure: RIGHT/LEFT HEART CATH AND CORONARY ANGIOGRAPHY;  Surgeon: Laurey Morale, MD;  Location: [facility] INVASIVE CV LAB;  Service: Cardiovascular;  Laterality: N/A;   RIGHT/LEFT HEART CATH AND CORONARY ANGIOGRAPHY N/A 09/20/2021   Procedure: RIGHT/LEFT HEART CATH AND CORONARY ANGIOGRAPHY;  Surgeon: Laurey Morale, MD;  Location: [facility] INVASIVE CV LAB;  Service: Cardiovascular;  Laterality: N/A;    ALLERGIES: Allergies  Allergen Reactions   Fish Allergy Swelling and Other (See Comments)    Can tolerate flounder, perch, and trout   Shellfish Allergy Anaphylaxis, Swelling and Other (See Comments)    Airway involvement including EMS - Has Epi-Pen   Metformin And Related Diarrhea   Nsaids Other (See Comments)    Per PCP, interferes with daily meds (heart failure)   Trulicity [Dulaglutide] Nausea And Vomiting and Other (See Comments)    Multiple episodes of vomiting over multiple days    FAMILY HISTORY: Family History  Problem Relation Age of Onset   Diabetes Mother    Hypertension Mother    Hypertension Father    Diabetes Father     SOCIAL HISTORY: Social History   Tobacco Use   Smoking status: Never   Smokeless tobacco: Never   Tobacco comments:    Never smoke 10/22/21  Vaping Use   Vaping status: Never Used  Substance Use Topics   Alcohol use: No    Alcohol/week: 0.0 standard drinks of alcohol   Drug use: No   Social History   Social History Narrative   Not on file    Objective:  Vital Signs:  There were no vitals taken for this visit.  ***  Labs and Imaging review: Internal labs: Lab Results  Component Value Date   HGBA1C 7.8 (A) 12/29/2022   Lab Results  Component Value Date   VITAMINB12 1,470 (H) 06/29/2022   Lab Results  Component Value Date   TSH 3.269 09/24/2022   04/04/23: CMP significant for Na 132, glucose 154, tbili 2.3, alk phos 151 CBC unremarkable  HbA1c (12/29/22): 7.8  Lipid  panel: Component     Latest Ref Rng 09/24/2022  Cholesterol     0 - 200 mg/dL 540   Triglycerides     <150 mg/dL 62   HDL Cholesterol     >40 mg/dL 27 (L)   Total CHOL/HDL Ratio     RATIO 5.0   VLDL     0 - 40 mg/dL 12   LDL (calc)     0 - 99 mg/dL 97     ***  External labs: ***  Imaging: CT head wo contrast and CTA head and neck (09/24/22): FINDINGS: CT HEAD FINDINGS   Brain: There is questionable loss of gray-white differentiation in the posterior insula suspicious for early infarct in the MCA distribution. The remainder of the MCA territory appears spared at this time. ASPECTS is 9.   There is no acute intracranial hemorrhage or extra-axial fluid collection.   Background parenchymal volume is normal. The ventricles are normal in size. Probable prominent perivascular spaces are noted in the bilateral basal ganglia.   The pituitary and suprasellar region are normal. There is no mass lesion. There is no mass effect or midline shift.   Vascular: See below.   Skull: Normal. Negative for fracture or focal lesion.   Sinuses/Orbits: There is mild mucosal thickening in the paranasal sinuses. There is a rightward gaze deviation.   Other: None.   Review of the MIP images confirms the above findings   CTA NECK FINDINGS   Aortic arch: The imaged aortic arch is normal. The origins of the major branch vessels are patent. The subclavian arteries are patent to the level imaged.   Right carotid system: The right common, internal, and external carotid arteries are patent with mild plaque in the common carotid artery and at the bifurcation but no hemodynamically significant stenosis or occlusion. There is no evidence of dissection or aneurysm.   Left carotid system: The left common, internal, and external carotid arteries are patent with mild plaque at the bifurcation but no hemodynamically significant stenosis or occlusion. There is no evidence of dissection or  aneurysm   Vertebral arteries: The vertebral arteries are patent, without hemodynamically significant stenosis or occlusion. There is no evidence of dissection or aneurysm.   Skeleton: There is no acute osseous abnormality or suspicious osseous lesion. There is no visible canal hematoma.   Other neck: The soft tissues of the neck are unremarkable.   Upper chest: There is an incompletely imaged right pleural effusion. The imaged lung apices are otherwise clear.   Review of the MIP images confirms the above findings   CTA HEAD FINDINGS   Anterior circulation: The intracranial ICAs are patent.   The right M1 segment is patent. Small patent MCA branches are seen in the region of the frontal operculum; however, there is essentially no other collateral flow in the remainder of the MCA distribution.   The left M1 segment and distal branches are patent, without proximal stenosis or occlusion.   The left A1 segment is relatively hypoplastic, likely developmental. The  dominant right A1 segment is patent. The distal ACA branches are patent, without proximal stenosis or occlusion.   There is no aneurysm or AVM.   Posterior circulation: The bilateral V4 segments are patent. The basilar artery is patent, diminutive in caliber. The bilateral PCAs are patent, supplied by posterior communicating arteries bilaterally (fetal origins).   There is no aneurysm or AVM.   Venous sinuses: Not well evaluated due to bolus timing.   Anatomic variants: As above.   Review of the MIP images confirms the above findings   IMPRESSION: 1. Loss of gray-white differentiation in the insula likely reflecting early infarct. Aspects is 9. 2. Proximal right M2 occlusion with minimal collateral flow throughout the MCA distribution. 3. Patent vasculature of the neck with no hemodynamically significant stenosis or occlusion.  MRI brain and MRA head (09/25/22): FINDINGS: MRI HEAD FINDINGS   Brain: There  is large area of diffusion restriction with associated FLAIR signal abnormality in the right MCA distribution involving the external capsule, insular cortex, temporal lobe, frontal lobe, and parietal consistent with acute infarct. There is also a small area of infarct in the right superior frontal gyrus cortex near the vertex in the ACA distribution. Additional punctate infarcts are seen in the left cerebellar hemisphere, right caudate body/corona radiata, and right frontal lobe white matter. There is no evidence of hemorrhagic transformation. There is swelling with sulcal effacement but no midline shift.   There are punctate chronic microhemorrhages in the right caudate body and right external capsule, nonspecific. The focus in the caudate body is unchanged since 2023.   Background parenchymal volume is normal. The ventricles are normal in size. There is no significant underlying chronic small-vessel ischemic change, there is a small remote infarct but in the right cerebral peduncle, unchanged since 2023.   The pituitary and suprasellar region are normal. There is no solid mass lesion.   Vascular: See below.   Skull and upper cervical spine: Normal marrow signal.   Sinuses/Orbits: The paranasal sinuses are clear. The globes and orbits are unremarkable.   Other: None.   MRA HEAD FINDINGS   Anterior circulation: The intracranial ICAs are patent.   The right M1 segment is normal. There is persistent occlusion of a dominant right M2 branch in the sylvian fissure (9-111). Patent MCA branches are seen in the frontal operculum region; however, there is otherwise minimal collateral flow in the remainder of the MCA distribution.   The left M1 segment and distal branches are patent, without proximal stenosis or occlusion.   The bilateral ACAs are patent, without proximal stenosis or occlusion.   There is no aneurysm or AVM.   Posterior circulation: The bilateral V4 segments  are patent. The basilar artery is patent. The major cerebellar arteries appear patent.   The bilateral PCAs are patent, primarily supplied by prominent posterior communicating arteries bilaterally (fetal origins). There is no proximal stenosis or occlusion   There is no aneurysm or AVM.   Anatomic variants: As above.   IMPRESSION: 1. Large area of acute infarct in the right MCA distribution, smaller area of acute infarct in the right ACA distribution, and punctate acute infarct in the left cerebellar hemisphere. No evidence of hemorrhagic transformation or midline shift. 2. Persistent occlusion of a right M2 branch with minimal collateral flow in the MCA distribution aside from in the region of the frontal operculum.  IR thrombectomy (09/24/22): FINDINGS: The right common carotid arteriogram demonstrates the right external carotid artery and its major branches to be  widely patent.   The right internal carotid artery at the bulb to the cranial skull base is widely patent with moderate tortuosity in the mid cervical right ICA.   The petrous, the cavernous and the supraclinoid right ICA demonstrate wide patency. A right posterior communicating artery is seen opacifying the right posterior cerebral artery distribution.   The right anterior cerebral artery opacifies into the capillary and venous phases.   The right middle cerebral artery M1 segment is widely patent as is the superior division. Occlusion is noted of the mid M2 segment of the inferior division.  IMPRESSION: Status post endovascular revascularization of occluded inferior division M2 segment of the right middle cerebral artery with 1 pass with a contact aspiration, 1 pass with contact aspiration and 3 mm x 20 mm Solitaire X retrieval device, 1 pass with contact aspiration and 1 pass with a 4 mm x 40 mm Solitaire X retrieval device, 1 pass with the 3 mm x 20 mm Solitaire X retrieval device with contact aspiration  and finally with 1 pass with contact aspiration without significant change achieving a TICI 2B revascularization.  Echocardiogram (09/25/22): 1. 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   2. Left ventricular ejection fraction, by estimation, is 15%. The left  ventricle has severely decreased function. The left ventricle demonstrates  global hypokinesis. There is mild left ventricular hypertrophy. Left  ventricular diastolic parameters are  consistent with Grade III diastolic dysfunction (restrictive).   3. Right ventricular systolic function is moderately reduced. The right  ventricular size is normal.   4. The mitral valve is grossly normal. Mild mitral valve regurgitation.  No evidence of mitral stenosis.   5. Tricuspid valve regurgitation is moderate.   6. The aortic valve is tricuspid. Aortic valve regurgitation is not  visualized. No aortic stenosis is present.   7. The inferior vena cava is normal in size with <50% respiratory  variability, suggesting right atrial pressure of 8 mmHg, patient currently  intubated on mechanical ventilation.  LA normal in size. ***  Assessment/Plan:  [name] is a 47 y.o. female who presents for evaluation of ***. *** has a relevant medical history of ***. *** neurological examination is pertinent for ***. Available diagnostic data is significant for ***. This constellation of symptoms and objective data would most likely localize to ***. ***  PLAN: -Blood work: *** ***  -Return to clinic ***  The impression above as well as the plan as outlined below were extensively discussed with the patient (in the company of ***) who voiced understanding. All questions were answered to their satisfaction.  The patient was counseled on pertinent fall precautions per the printed material provided today, and as noted under the "Patient Instructions" section below.***  When available, results of the above investigations and possible further  recommendations will be communicated to the patient via telephone/MyChart. Patient to call office if not contacted after expected testing turnaround time.   Total time spent reviewing records, interview, history/exam, documentation, and coordination of care on day of encounter:  *** min   Thank you for allowing me to participate in patient's care.  If I can answer any additional questions, I would be pleased to do so.  Jacquelyne Balint, MD   CC: Levin Erp, MD [address] [place] [address]  CC: Referring provider: Carlos Levering, NP [address] [place],  [place] [address]

## 2023-04-17 ENCOUNTER — Telehealth: Payer: Self-pay | Admitting: Pharmacist

## 2023-04-17 ENCOUNTER — Other Ambulatory Visit (HOSPITAL_COMMUNITY): Payer: Self-pay

## 2023-04-17 ENCOUNTER — Other Ambulatory Visit: Payer: Self-pay

## 2023-04-17 DIAGNOSIS — M25512 Pain in left shoulder: Secondary | ICD-10-CM

## 2023-04-17 DIAGNOSIS — F5101 Primary insomnia: Secondary | ICD-10-CM

## 2023-04-17 MED ORDER — LOSARTAN POTASSIUM 50 MG PO TABS
50.0000 mg | ORAL_TABLET | Freq: Every day | ORAL | 5 refills | Status: DC
  Filled 2023-04-17 – 2023-07-06 (×5): qty 30, 30d supply, fill #0

## 2023-04-17 MED ORDER — ATORVASTATIN CALCIUM 80 MG PO TABS
80.0000 mg | ORAL_TABLET | Freq: Every day | ORAL | 5 refills | Status: DC
  Filled 2023-04-17 – 2023-04-24 (×3): qty 30, 30d supply, fill #0

## 2023-04-17 MED ORDER — AMANTADINE HCL 100 MG PO CAPS
200.0000 mg | ORAL_CAPSULE | Freq: Every day | ORAL | 5 refills | Status: DC
  Filled 2023-04-17: qty 30, 15d supply, fill #0
  Filled 2023-04-18 – 2023-04-24 (×3): qty 60, 30d supply, fill #0
  Filled 2023-07-05 – 2023-07-06 (×2): qty 60, 30d supply, fill #1

## 2023-04-17 MED ORDER — EMPAGLIFLOZIN 25 MG PO TABS
25.0000 mg | ORAL_TABLET | Freq: Every day | ORAL | 5 refills | Status: DC
  Filled 2023-04-17 – 2023-04-24 (×4): qty 30, 30d supply, fill #0
  Filled 2023-07-05 – 2023-07-06 (×2): qty 30, 30d supply, fill #1

## 2023-04-17 MED ORDER — ESCITALOPRAM OXALATE 10 MG PO TABS
10.0000 mg | ORAL_TABLET | Freq: Every day | ORAL | 5 refills | Status: DC
  Filled 2023-04-17 – 2023-04-24 (×4): qty 30, 30d supply, fill #0

## 2023-04-17 MED ORDER — APIXABAN 5 MG PO TABS
5.0000 mg | ORAL_TABLET | Freq: Two times a day (BID) | ORAL | 5 refills | Status: DC
  Filled 2023-04-17 – 2023-07-06 (×6): qty 60, 30d supply, fill #0

## 2023-04-17 MED ORDER — VITAMIN D 125 MCG (5000 UT) PO CAPS
5000.0000 [IU] | ORAL_CAPSULE | Freq: Every day | ORAL | 5 refills | Status: AC
Start: 2023-04-17 — End: ?
  Filled 2023-04-17: qty 30, 30d supply, fill #0

## 2023-04-17 MED ORDER — METOPROLOL TARTRATE 25 MG PO TABS
25.0000 mg | ORAL_TABLET | Freq: Two times a day (BID) | ORAL | 5 refills | Status: DC
  Filled 2023-04-17 – 2023-04-24 (×4): qty 60, 30d supply, fill #0
  Filled 2023-07-05 – 2023-07-06 (×2): qty 60, 30d supply, fill #1

## 2023-04-17 MED ORDER — MELATONIN 5 MG PO TABS
5.0000 mg | ORAL_TABLET | Freq: Every day | ORAL | 5 refills | Status: DC
  Filled 2023-04-17 – 2023-04-24 (×3): qty 30, 30d supply, fill #0
  Filled 2023-11-02: qty 30, 30d supply, fill #1
  Filled 2023-12-04: qty 30, 30d supply, fill #2

## 2023-04-17 MED ORDER — GLIPIZIDE 5 MG PO TABS
5.0000 mg | ORAL_TABLET | Freq: Every day | ORAL | 5 refills | Status: DC
  Filled 2023-04-17 – 2023-04-24 (×4): qty 30, 30d supply, fill #0

## 2023-04-17 MED ORDER — SPIRONOLACTONE 25 MG PO TABS
25.0000 mg | ORAL_TABLET | Freq: Every day | ORAL | 5 refills | Status: DC
  Filled 2023-04-17 – 2023-04-24 (×4): qty 30, 30d supply, fill #0
  Filled 2023-07-05 – 2023-07-06 (×2): qty 30, 30d supply, fill #1

## 2023-04-17 MED ORDER — TRAZODONE HCL 50 MG PO TABS
50.0000 mg | ORAL_TABLET | Freq: Every day | ORAL | 5 refills | Status: DC
Start: 2023-04-17 — End: 2023-07-27
  Filled 2023-04-17 – 2023-04-24 (×4): qty 30, 30d supply, fill #0
  Filled 2023-07-05 – 2023-07-06 (×2): qty 30, 30d supply, fill #1

## 2023-04-17 NOTE — Telephone Encounter (Signed)
Patient contacted for follow-up of request for medication in pill pack. Reviewed medications with patient.  Contacted [employer] about patient's request for medication pill pack. They requested all scripts to be sent over and relayed patient's information and request for medication pill packs to [facility].    Sent over all scripts for medications that will be in the pill pack.  Patient takes medications as following:  amantadine (Symmetrel) - patient takes in the morning apixaban (Eliquis) - patient takes in the morning and at bedtime atorvastatin (Lipitor) - patient takes at bedtime cholecalciferol (Vitamin D) - patient takes in the morning empagliflozin (Jardiance) - patient takes in the morning escitalopram (Lexapro) - patient takes at bedtime glipizide (Glucotrol) - patient takes in the morning losartan (Cozaar) - patient takes in the morning melatonin - patient takes at bedtime  metoprolol tartrate (Lopressor) - patient takes in the morning and at bedtime spironolactone (Aldactone) - patient takes in the morning trazadone (Desyrel) - patient takes at bedtime   Note: acetaminophen, hydroxyzine, methocarbamol, oxycodone, pregabalin, and torsemide will not be in the medication pill pack  Total time with patient call and documentation of interaction: 22 minutes.

## 2023-04-17 NOTE — Telephone Encounter (Signed)
-----   Message from [employer] sent at 04/12/2023  2:07 PM EDT ----- Regarding: RE: Medication pack Hey!  My typical procedure is for the pharmacist (or a learner) to call the patient, review meds and what they need/when, and then communicate with Sherlynn Stalls and/or Osvaldo Shipper at [facility] to talk through what to package when, get patient set up in the [facility] system. This will be easier since the patient's meds are already at [facility].   Catie ----- Message ----- From: Otho Najjar, CPhT Sent: 04/12/2023   1:52 PM EDT To: Alden Hipp, RPH-CPP Subject: FW: Medication pack                            Wasn't sure again who to route these requests to! ----- Message ----- From: Levin Erp, MD Sent: 04/11/2023   8:10 PM EDT To: Kathrin Ruddy, RPH-CPP; # Subject: Medication pack                                Do you know how I can set up a medication pack for this patient through [facility]?  Mayuri

## 2023-04-18 ENCOUNTER — Other Ambulatory Visit (HOSPITAL_COMMUNITY): Payer: Self-pay

## 2023-04-18 NOTE — Telephone Encounter (Signed)
Reviewed and agree with Dr Koval's plan.   

## 2023-04-21 ENCOUNTER — Other Ambulatory Visit: Payer: Self-pay

## 2023-04-21 ENCOUNTER — Other Ambulatory Visit (HOSPITAL_COMMUNITY): Payer: Self-pay

## 2023-04-24 ENCOUNTER — Other Ambulatory Visit: Payer: Self-pay | Admitting: Student

## 2023-04-24 ENCOUNTER — Other Ambulatory Visit (HOSPITAL_COMMUNITY): Payer: Self-pay

## 2023-04-24 ENCOUNTER — Other Ambulatory Visit: Payer: Self-pay

## 2023-04-25 ENCOUNTER — Other Ambulatory Visit: Payer: Self-pay

## 2023-04-27 ENCOUNTER — Ambulatory Visit: Payer: Medicare Other | Admitting: Neurology

## 2023-05-04 ENCOUNTER — Other Ambulatory Visit (HOSPITAL_COMMUNITY): Payer: Self-pay

## 2023-05-04 NOTE — Progress Notes (Deleted)
    SUBJECTIVE:   CHIEF COMPLAINT / HPI: Weight discussion  Has been losing significant amount of weight  Has been  Hospitalization 9/30 Admitted to ICU for acute hypoxic respiratory failure d/t RLL aspiration PNA-required significant HHF O2. Speech cleared for regular diet with thin liquids and aspiration precuations. Acute encephalopathy-d/t seizure disorder--EEG revealed multiple seizure activity episodes. Was started on Keppra 750 BID. Neuro follow up***. Require endotool for hyperglycemia.     PERTINENT  PMH / PSH: ***  OBJECTIVE:   There were no vitals taken for this visit.  ***  ASSESSMENT/PLAN:   No problem-specific Assessment & Plan notes found for this encounter.     Levin Erp, MD [facility]

## 2023-05-05 ENCOUNTER — Ambulatory Visit: Payer: Medicare Other | Admitting: Student

## 2023-05-09 ENCOUNTER — Other Ambulatory Visit (HOSPITAL_COMMUNITY): Payer: Self-pay

## 2023-05-10 ENCOUNTER — Other Ambulatory Visit (HOSPITAL_COMMUNITY): Payer: Self-pay

## 2023-05-11 ENCOUNTER — Other Ambulatory Visit (HOSPITAL_COMMUNITY): Payer: Self-pay

## 2023-05-26 ENCOUNTER — Encounter: Payer: Medicare Other | Admitting: Physical Medicine and Rehabilitation

## 2023-06-12 ENCOUNTER — Ambulatory Visit: Payer: Medicare Other | Admitting: Internal Medicine

## 2023-06-12 ENCOUNTER — Other Ambulatory Visit (HOSPITAL_COMMUNITY): Payer: Self-pay

## 2023-06-12 MED ORDER — DAPAGLIFLOZIN PROPANEDIOL 10 MG PO TABS
10.0000 mg | ORAL_TABLET | Freq: Every day | ORAL | 0 refills | Status: DC
  Filled 2023-06-12: qty 30, 30d supply, fill #0

## 2023-06-12 MED ORDER — BISACODYL 5 MG PO TBEC
10.0000 mg | DELAYED_RELEASE_TABLET | Freq: Every day | ORAL | 0 refills | Status: DC | PRN
  Filled 2023-06-12: qty 60, 30d supply, fill #0

## 2023-06-12 MED ORDER — VITAMIN D3 125 MCG (5000 UT) PO CAPS
5000.0000 [IU] | ORAL_CAPSULE | Freq: Every day | ORAL | 0 refills | Status: DC
  Filled 2023-06-12: qty 30, 30d supply, fill #0

## 2023-06-12 MED ORDER — OXYCODONE HCL 5 MG PO TABS
5.0000 mg | ORAL_TABLET | ORAL | 0 refills | Status: DC | PRN
  Filled 2023-06-12: qty 30, 5d supply, fill #0

## 2023-06-12 MED ORDER — ESCITALOPRAM OXALATE 10 MG PO TABS
10.0000 mg | ORAL_TABLET | Freq: Every day | ORAL | 0 refills | Status: DC
  Filled 2023-06-12: qty 30, 30d supply, fill #0

## 2023-06-12 MED ORDER — ALBUTEROL SULFATE HFA 108 (90 BASE) MCG/ACT IN AERS
2.0000 | INHALATION_SPRAY | Freq: Four times a day (QID) | RESPIRATORY_TRACT | 0 refills | Status: DC | PRN
  Filled 2023-06-12: qty 18, 25d supply, fill #0

## 2023-06-12 MED ORDER — TORSEMIDE 20 MG PO TABS
20.0000 mg | ORAL_TABLET | Freq: Every day | ORAL | 0 refills | Status: DC
  Filled 2023-06-12: qty 30, 30d supply, fill #0

## 2023-06-12 MED ORDER — DABIGATRAN ETEXILATE MESYLATE 150 MG PO CAPS
150.0000 mg | ORAL_CAPSULE | Freq: Two times a day (BID) | ORAL | 0 refills | Status: DC
  Filled 2023-06-12: qty 60, 30d supply, fill #0

## 2023-06-12 MED ORDER — ATORVASTATIN CALCIUM 80 MG PO TABS
80.0000 mg | ORAL_TABLET | Freq: Every evening | ORAL | 0 refills | Status: DC
  Filled 2023-06-12: qty 30, 30d supply, fill #0

## 2023-06-12 MED ORDER — LEVETIRACETAM 750 MG PO TABS
750.0000 mg | ORAL_TABLET | Freq: Two times a day (BID) | ORAL | 0 refills | Status: DC
  Filled 2023-06-12: qty 60, 30d supply, fill #0

## 2023-06-12 MED ORDER — ACETAMINOPHEN EXTRA STRENGTH 500 MG PO TABS
500.0000 mg | ORAL_TABLET | Freq: Four times a day (QID) | ORAL | 0 refills | Status: DC | PRN
  Filled 2023-06-12: qty 30, 8d supply, fill #0

## 2023-06-12 MED ORDER — HYDROXYZINE HCL 10 MG PO TABS
10.0000 mg | ORAL_TABLET | Freq: Three times a day (TID) | ORAL | 0 refills | Status: DC | PRN
  Filled 2023-06-12: qty 30, 10d supply, fill #0

## 2023-06-12 MED ORDER — SPIRONOLACTONE 25 MG PO TABS
25.0000 mg | ORAL_TABLET | Freq: Every day | ORAL | 0 refills | Status: DC
  Filled 2023-06-12: qty 30, 30d supply, fill #0

## 2023-06-12 MED ORDER — METOPROLOL TARTRATE 25 MG PO TABS
25.0000 mg | ORAL_TABLET | Freq: Two times a day (BID) | ORAL | 0 refills | Status: DC
  Filled 2023-06-12: qty 60, 30d supply, fill #0

## 2023-06-12 MED ORDER — GLIPIZIDE 5 MG PO TABS
5.0000 mg | ORAL_TABLET | Freq: Every morning | ORAL | 0 refills | Status: DC
  Filled 2023-06-12: qty 30, 30d supply, fill #0

## 2023-06-12 MED ORDER — LOSARTAN POTASSIUM 50 MG PO TABS
50.0000 mg | ORAL_TABLET | Freq: Every day | ORAL | 0 refills | Status: DC
  Filled 2023-06-12: qty 30, 30d supply, fill #0

## 2023-06-14 ENCOUNTER — Ambulatory Visit: Payer: Medicare Other | Attending: Internal Medicine | Admitting: Internal Medicine

## 2023-06-16 ENCOUNTER — Telehealth: Payer: Self-pay

## 2023-06-16 ENCOUNTER — Other Ambulatory Visit (HOSPITAL_COMMUNITY): Payer: Self-pay

## 2023-06-16 MED ORDER — TRAZODONE HCL 150 MG PO TABS
150.0000 mg | ORAL_TABLET | Freq: Every evening | ORAL | 0 refills | Status: DC
  Filled 2023-06-16: qty 30, 30d supply, fill #0

## 2023-06-16 NOTE — Telephone Encounter (Signed)
Theora Gianotti Speech Therapist called seeking a verbal order for in the home therapy: once a week for 5 weeks. Visit to work on dysphagia.   Call back phone [phone]. Okay given. Call to remind patient the follow up appointments must be kept.

## 2023-06-27 ENCOUNTER — Telehealth: Payer: Self-pay

## 2023-06-27 NOTE — Telephone Encounter (Signed)
[name] PT with [facility] called seeking orders: Physical therapy  once a week for 8 weeks. To work on balance, strength & core stability.   Okay given.   Call back phone [phone].

## 2023-07-04 ENCOUNTER — Encounter: Payer: Self-pay | Admitting: Physical Medicine and Rehabilitation

## 2023-07-04 ENCOUNTER — Other Ambulatory Visit (HOSPITAL_COMMUNITY): Payer: Self-pay

## 2023-07-04 ENCOUNTER — Encounter (HOSPITAL_COMMUNITY): Payer: Self-pay

## 2023-07-04 ENCOUNTER — Other Ambulatory Visit: Payer: Self-pay | Admitting: Physical Medicine and Rehabilitation

## 2023-07-04 ENCOUNTER — Encounter
Payer: Medicare Other | Attending: Physical Medicine and Rehabilitation | Admitting: Physical Medicine and Rehabilitation

## 2023-07-04 ENCOUNTER — Telehealth: Payer: Self-pay

## 2023-07-04 VITALS — Ht 59.0 in

## 2023-07-04 DIAGNOSIS — M62838 Other muscle spasm: Secondary | ICD-10-CM | POA: Insufficient documentation

## 2023-07-04 DIAGNOSIS — G894 Chronic pain syndrome: Secondary | ICD-10-CM | POA: Insufficient documentation

## 2023-07-04 DIAGNOSIS — Z794 Long term (current) use of insulin: Secondary | ICD-10-CM

## 2023-07-04 DIAGNOSIS — Z5181 Encounter for therapeutic drug level monitoring: Secondary | ICD-10-CM | POA: Diagnosis present

## 2023-07-04 DIAGNOSIS — E1165 Type 2 diabetes mellitus with hyperglycemia: Secondary | ICD-10-CM | POA: Diagnosis not present

## 2023-07-04 DIAGNOSIS — Z79899 Other long term (current) drug therapy: Secondary | ICD-10-CM | POA: Insufficient documentation

## 2023-07-04 DIAGNOSIS — Z7901 Long term (current) use of anticoagulants: Secondary | ICD-10-CM | POA: Diagnosis present

## 2023-07-04 DIAGNOSIS — I639 Cerebral infarction, unspecified: Secondary | ICD-10-CM | POA: Diagnosis not present

## 2023-07-04 MED ORDER — ACCU-CHEK SOFTCLIX LANCETS MISC
1.0000 | Freq: Three times a day (TID) | 0 refills | Status: AC
  Filled 2023-07-04: qty 100, 34d supply, fill #0

## 2023-07-04 MED ORDER — CYCLOBENZAPRINE HCL 5 MG PO TABS
5.0000 mg | ORAL_TABLET | Freq: Three times a day (TID) | ORAL | 0 refills | Status: DC | PRN
  Filled 2023-07-04: qty 30, 10d supply, fill #0

## 2023-07-04 MED ORDER — LANCET DEVICE MISC
1.0000 | Freq: Three times a day (TID) | 0 refills | Status: AC
  Filled 2023-07-04: qty 1, 30d supply, fill #0

## 2023-07-04 MED ORDER — ACCU-CHEK GUIDE W/DEVICE KIT
PACK | Freq: Four times a day (QID) | 0 refills | Status: AC
  Filled 2023-07-04: qty 1, 1d supply, fill #0

## 2023-07-04 MED ORDER — BLOOD GLUCOSE METER KIT
PACK | Freq: Four times a day (QID) | 0 refills | Status: AC
  Filled 2023-07-04: qty 1, fill #0

## 2023-07-04 MED ORDER — ACCU-CHEK GUIDE W/DEVICE KIT
PACK | 0 refills | Status: DC
  Filled 2023-07-04: qty 1, 30d supply, fill #0

## 2023-07-04 NOTE — Patient Instructions (Addendum)
Diabetes: -check CBGs daily, log, and bring log to follow-up appointment -avoid sugar, bread, pasta, rice -avoid snacking -perform daily foot exam and at least annual eye exam -try to incorporate into your diet some of the following foods which are good for diabetes: 1) cinnamon- imitates effects of insulin, increasing glucose transport into cells ([place] or [place] cinnamon is best, least processed) 2) nuts- can slow down the blood sugar response of carbohydrate rich foods 3) oatmeal- contains and anti-inflammatory compound avenanthramide 4) whole-milk yogurt (best types are no sugar, [place] yogurt, or goat/sheep yogurt) 5) beans- high in protein, fiber, and vitamins, low glycemic index 6) broccoli- great source of vitamin A and C 7) quinoa- higher in protein and fiber than other grains 8) spinach- high in vitamin A, fiber, and protein 9) olive oil- reduces glucose levels, LDL, and triglycerides 10) salmon- excellent amount of omega-3-fatty acids 11) walnuts- rich in antioxidants 12) apples- high in fiber and quercetin 13) carrots- highly nutritious with low impact on blood sugar 14) eggs- improve HDL (good cholesterol), high in protein, keep you satiated 15) turmeric: improves blood sugars, cardiovascular disease, and protects kidney health 16) garlic: improves blood sugar, blood pressure, pain 17) tomatoes: highly nutritious with low impact on blood sugar   Methylated B vitamin or green leafy vegetables every day

## 2023-07-04 NOTE — Progress Notes (Signed)
Subjective:    Patient ID: [name], female    DOB: 10-Sep-1975, 47 y.o.   MRN: [id number]  HPI [name] is a 47 year old woman who presents for follow-up of CVA  1) CVA -she would benefit from home health -she is receiving support from her ather -she is able to walk to and from the bathroom easily.  -AFO causing her pain, she feels she walks better without it  2) HTN: -says she just took her blood pressure medication 15 minutes ago and so it hasn't kicked in   3) Anxiety:  -she needs refill of hydroxyzine  4) insomnia: -needs refill of her trazoodone  5) Peripheral neuropathy:  -pain is present in both feet -feels like burning -asks for tylenol #3  6) Muscle spasms: -would like muscle relaxer   Pain Inventory Average Pain 10 Pain Right Now 10 My pain is constant, sharp, and burning  LOCATION OF PAIN  Lt arm, left leg & both feet  BOWEL Number of stools per week: 2 Oral laxative use Yes  Type of laxative unknown Enema or suppository use No  History of colostomy No  Incontinent No   BLADDER Normal and Pads    Mobility ability to climb steps?  no do you drive?  no use a wheelchair needs help with transfers Do you have any goals in this area?  yes  Function 3 /2024  Neuro/Psych trouble walking spasms confusion depression anxiety  Prior Studies None  Physicians involved in your care Any changes since last visit?  no   Family History  Problem Relation Age of Onset   Diabetes Mother    Hypertension Mother    Hypertension Father    Diabetes Father    Social History   Socioeconomic History   Marital status: Single    Spouse name: Not on file   Number of children: Not on file   Years of education: Not on file   Highest education level: Not on file  Occupational History   Not on file  Tobacco Use   Smoking status: Never   Smokeless tobacco: Never   Tobacco comments:    Never smoke 10/22/21  Vaping Use   Vaping status:  Never Used  Substance and Sexual Activity   Alcohol use: No    Alcohol/week: 0.0 standard drinks of alcohol   Drug use: No   Sexual activity: Not Currently    Birth control/protection: None  Other Topics Concern   Not on file  Social History Narrative   Not on file   Social Determinants of Health   Financial Resource Strain: Low Risk  (04/24/2023)   [facility]   Overall Financial Resource Strain (CARDIA)    Difficulty of Paying Living Expenses: Not hard at all  Food Insecurity: No Food Insecurity (04/24/2023)   [facility]   Hunger Vital Sign    Worried About Running Out of Food in the Last Year: Never true    Ran Out of Food in the Last Year: Never true  Transportation Needs: No Transportation Needs (04/24/2023)   Received from [facility]   PRAPARE - Transportation    Lack of Transportation (Medical): No    Lack of Transportation (Non-Medical): No  Physical Activity: Inactive (04/04/2023)   Exercise Vital Sign    Days of Exercise per Week: 0 days    Minutes of Exercise per Session: 0 min  Stress: Stress Concern Present (04/04/2023)   [place]  Institute of Occupational Health - Occupational Stress Questionnaire    Feeling of Stress : Very much  Social Connections: Socially Isolated (04/04/2023)   Social Connection and Isolation Panel [NHANES]    Frequency of Communication with Friends and Family: Once a week    Frequency of Social Gatherings with Friends and Family: Never    Attends Religious Services: Never    Database administrator or Organizations: No    Attends Banker Meetings: Never    Marital Status: Never married   Past Surgical History:  Procedure Laterality Date   BIOPSY  11/05/2021   Procedure: BIOPSY;  Surgeon: Jenel Lucks, MD;  Location: [facility] ENDOSCOPY;  Service: Gastroenterology;;   CERVIX LESION DESTRUCTION     CESAREAN SECTION     ESOPHAGOGASTRODUODENOSCOPY (EGD) WITH PROPOFOL N/A 11/05/2021    Procedure: ESOPHAGOGASTRODUODENOSCOPY (EGD) WITH PROPOFOL;  Surgeon: Jenel Lucks, MD;  Location: [facility] ENDOSCOPY;  Service: Gastroenterology;  Laterality: N/A;   IR CT HEAD LTD  09/24/2022   IR PERCUTANEOUS ART THROMBECTOMY/INFUSION INTRACRANIAL INC DIAG ANGIO  09/24/2022   RADIOLOGY WITH ANESTHESIA N/A 09/24/2022   Procedure: IR WITH ANESTHESIA;  Surgeon: Radiologist, Medication, MD;  Location: [facility] OR;  Service: Radiology;  Laterality: N/A;   RIGHT/LEFT HEART CATH AND CORONARY ANGIOGRAPHY N/A 03/29/2019   Procedure: RIGHT/LEFT HEART CATH AND CORONARY ANGIOGRAPHY;  Surgeon: Laurey Morale, MD;  Location: [facility] INVASIVE CV LAB;  Service: Cardiovascular;  Laterality: N/A;   RIGHT/LEFT HEART CATH AND CORONARY ANGIOGRAPHY N/A 09/20/2021   Procedure: RIGHT/LEFT HEART CATH AND CORONARY ANGIOGRAPHY;  Surgeon: Laurey Morale, MD;  Location: [facility] INVASIVE CV LAB;  Service: Cardiovascular;  Laterality: N/A;   Past Medical History:  Diagnosis Date   Abnormal Pap smear    Acute combined systolic and diastolic heart failure (HCC) 02/19/2019   Wt Readings from Last 3 Encounters:  09/18/21  84.7 kg  09/02/21  84.4 kg  08/31/21  84.2 kg         Acute heart failure (HCC) 01/29/2022   Acute on chronic combined systolic and diastolic CHF (congestive heart failure) (HCC) 02/19/2019   Acute on chronic congestive heart failure (HCC)    Acute on chronic congestive heart failure (HCC)    Acute on chronic diastolic CHF (congestive heart failure) (HCC) 10/19/2021   Acute on chronic systolic heart failure (HCC) 02/22/2022   Anemia    Asthma    Atrial fibrillation (HCC)    history of paroxysmal atrial fibrillation   CHF (congestive heart failure) (HCC) 11/02/2021   CHF exacerbation (HCC) 10/17/2021   Diabetes mellitus    DKA, type 2 (HCC) 02/06/2019   Elevated troponin 09/30/2019   Heart failure with reduced ejection fraction (HCC)    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     Migraines    Morbid obesity (HCC) 02/06/2019   Nausea & vomiting 06/26/2022   Nonischemic cardiomyopathy (HCC) 09/22/2021   NSTEMI (non-ST elevated myocardial infarction) (HCC) 02/23/2022   Obstructive sleep apnea 06/30/2015   Polycystic ovarian disease    There were no vitals taken for this visit.  Opioid Risk Score:  Fall Risk Score:  `1  Depression screen [facility] 2/9     04/04/2023   10:32 AM 02/21/2023   10:45 AM 08/05/2022    9:39 AM 01/19/2022    3:07 PM 10/22/2021    1:31 PM 09/27/2021    4:33 PM 08/31/2021   11:15 AM  Depression screen PHQ 2/9  Decreased Interest 3 2 3 1 1 1 1   Down, Depressed, Hopeless 3 2 3 2 2 2 2   PHQ - 2 Score 6 4 6 3 3 3 3   Altered sleeping 3 0 3 3 2 3 2   Tired, decreased energy 3 1 3 2 1 1 1   Change in appetite 0 0 3 2 1 1 1   Feeling bad or failure about yourself  2 3  2 1 1 1   Trouble concentrating 2 2  2  0 1 0  Moving slowly or fidgety/restless 0 0  0 1 1 0  Suicidal thoughts 0 1 0 1 1 0 1  PHQ-9 Score 16 11 15 15 10 11 9   Difficult doing work/chores Very difficult      Somewhat difficult     Review of Systems  Gastrointestinal:  Positive for constipation.  Musculoskeletal:  Positive for gait problem.       Lt arm pain, pain in both feet, left leg   All other systems reviewed and are negative.      Objective:   Physical Exam PRIOR EXAM:  Gen: no distress, normal appearing HEENT: oral mucosa pink and moist, NCAT Cardio: Reg rate Chest: normal effort, normal rate of breathing Abd: soft, non-distended Ext: no edema Psych: pleasant, normal affect Skin: intact Neuro: Alert and oriented x3       Assessment & Plan:   1) CVA Continue home therapy Prescribing Home Zynex NexWave Stimulator Device and supplies as needed. IFC, NMES and TENS medically necessary Treatment Rx: Daily @ 30-40 minutes per treatment PRN. Zynex NexWave only, no substitutions. Treatment Goals: 1) To reduce and/or eliminate pain 2) To improve functional capacity and Activities of daily living 3) To reduce or prevent the need for oral medications 4) To improve circulation in the injured region 5) To decrease or prevent muscle spasm and muscle atrophy 6) To provide a self-management tool to the patient The patient has not sufficiently improved with conservative care. Numerous studies indexed by Medline and PubMed.gov have shown Neuromuscular, Interferential, and TENS stimulators to reduce pain, improve function, and reduce medication use in injured patients. Continued use of this evidence based, safe, drug free treatment is both reasonable and medically necessary at this time.   2) Anxiety: -prescribed hydroxyzine  3) Insomnia: -trazodone prescribed  4) HTN: -continue home blood pressure medications -Advised checking BP daily at home and logging results to bring into follow-up appointment with PCP and myself. -Reviewed BP meds today.  -Advised regarding healthy foods that can help lower blood pressure and provided with a list: 1) citrus foods- high in vitamins and minerals 2) salmon and other fatty fish - reduces inflammation and oxylipins 3) swiss chard (leafy green)- high level of nitrates 4) pumpkin seeds- one of the best natural sources of magnesium 5) Beans and lentils- high in fiber, magnesium, and potassium 6) Berries- high in flavonoids 7) Amaranth (whole grain, can be cooked similarly to rice and oats)- high in magnesium and fiber 8) Pistachios- even more effective at reducing BP than other nuts 9) Carrots- high in phenolic compounds that relax blood vessels and reduce inflammation 10) Celery- contain phthalides  that relax  tissues of arterial walls 11) Tomatoes- can also improve cholesterol and reduce risk of heart disease 12) Broccoli- good source of magnesium, calcium, and potassium 13) Greek yogurt: high in potassium and calcium 14) Herbs and spices: Celery seed, cilantro, saffron, lemongrass, black cumin, ginseng, cinnamon, cardamom, sweet basil, and ginger 15) Chia and flax seeds- also help to lower cholesterol and blood sugar 16) Beets- high levels of nitrates that relax blood vessels  17) spinach and bananas- high in potassium  -Provided lise of supplements that can help with hypertension:  1) magnesium: one high quality brand is Bioptemizers since it contains all 7 types of magnesium, otherwise over the counter magnesium gluconate 400mg  is a good option 2) B vitamins 3) vitamin D 4) potassium 5) CoQ10 6) L-arginine 7) Vitamin C 8) Beetroot -Educated that goal BP is 120/80. -Made goal to incorporate some of the above foods into diet.    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 Discussed that this is a capsaicin patch, stronger than capsaicin cream. Discussed that it is currently approved for diabetic peripheral neuropathy and post-herpetic neuralgia, but that it has also shown benefit in treating other forms of neuropathy. Provided patient with link to site to learn more about the patch: https://www.clark.biz/. Discussed that the patch would be placed in office and benefits usually last 3 months. Discussed that unintended exposure to capsaicin can cause severe irritation of eyes, mucous membranes, respiratory tract, and skin, but that Qutenza is a local treatment and does not have the systemic side effects of other nerve medications. Discussed that there may be pain, itching, erythema, and decreased sensory function associated with the application of Qutenza. Side effects usually subside within 1 week. A cold pack of analgesic medications can  help with these side effects. Blood pressure can also be increased due to pain associated with administration of the patch.   Prescribing Home Zynex NexWave Stimulator Device and supplies as needed. IFC, NMES and TENS medically necessary Treatment Rx: Daily @ 30-40 minutes per treatment PRN. Zynex NexWave only, no substitutions. Treatment Goals: 1) To reduce and/or eliminate pain 2) To improve functional capacity and Activities of daily living 3) To reduce or prevent the need for oral medications 4) To improve circulation in the injured region 5) To decrease or prevent muscle spasm and muscle atrophy 6) To provide a self-management tool to the patient The patient has not sufficiently improved with conservative care. Numerous studies indexed by Medline and PubMed.gov have shown Neuromuscular, Interferential, and TENS stimulators to reduce pain, improve function, and reduce medication use in injured patients. Continued use of this evidence based, safe, drug free treatment is both reasonable and medically necessary at this time.   Pain contract and urine sample performed today  -glucose meter and supplies ordered  6) Muscle spasms:  -flexeril ordered prn

## 2023-07-04 NOTE — Telephone Encounter (Signed)
Patient called stating that [facility] can't fill the prescriptions for Cyclobenzaprine, glucose device and kit. Can you send to [facility]?

## 2023-07-05 ENCOUNTER — Other Ambulatory Visit: Payer: Self-pay

## 2023-07-05 ENCOUNTER — Other Ambulatory Visit (HOSPITAL_COMMUNITY): Payer: Self-pay

## 2023-07-06 ENCOUNTER — Other Ambulatory Visit (HOSPITAL_COMMUNITY): Payer: Self-pay

## 2023-07-06 ENCOUNTER — Other Ambulatory Visit: Payer: Self-pay

## 2023-07-07 ENCOUNTER — Other Ambulatory Visit: Payer: Self-pay

## 2023-07-10 ENCOUNTER — Other Ambulatory Visit: Payer: Self-pay

## 2023-07-10 LAB — DRUG TOX MONITOR 1 W/CONF, ORAL FLD
Amphetamines: NEGATIVE ng/mL (ref <10)
Barbiturates: NEGATIVE ng/mL (ref <10)
Benzodiazepines: NEGATIVE ng/mL (ref <0.50)
Buprenorphine: NEGATIVE ng/mL (ref <0.10)
Cocaine: NEGATIVE ng/mL (ref <5.0)
Codeine: NEGATIVE ng/mL (ref <2.5)
Dihydrocodeine: NEGATIVE ng/mL (ref <2.5)
Fentanyl: NEGATIVE ng/mL (ref <0.10)
Heroin Metabolite: NEGATIVE ng/mL (ref <1.0)
Hydrocodone: NEGATIVE ng/mL (ref <2.5)
Hydromorphone: NEGATIVE ng/mL (ref <2.5)
MARIJUANA: NEGATIVE ng/mL (ref <2.5)
MDMA: NEGATIVE ng/mL (ref <10)
Meprobamate: NEGATIVE ng/mL (ref <2.5)
Methadone: NEGATIVE ng/mL (ref <5.0)
Morphine: NEGATIVE ng/mL (ref <2.5)
Nicotine Metabolite: NEGATIVE ng/mL (ref <5.0)
Norhydrocodone: NEGATIVE ng/mL (ref <2.5)
Noroxycodone: 9.6 ng/mL — ABNORMAL HIGH (ref <2.5)
Opiates: POSITIVE ng/mL — AB (ref <2.5)
Oxycodone: 17 ng/mL — ABNORMAL HIGH (ref <2.5)
Oxymorphone: NEGATIVE ng/mL (ref <2.5)
Phencyclidine: NEGATIVE ng/mL (ref <10)
Tapentadol: NEGATIVE ng/mL (ref <5.0)
Tramadol: NEGATIVE ng/mL (ref <5.0)
Zolpidem: NEGATIVE ng/mL (ref <5.0)

## 2023-07-10 LAB — DRUG TOX ALC METAB W/CON, ORAL FLD: Alcohol Metabolite: NEGATIVE ng/mL (ref <25)

## 2023-07-11 ENCOUNTER — Other Ambulatory Visit (HOSPITAL_COMMUNITY): Payer: Self-pay

## 2023-07-11 ENCOUNTER — Other Ambulatory Visit: Payer: Self-pay | Admitting: Physical Medicine and Rehabilitation

## 2023-07-11 ENCOUNTER — Telehealth: Payer: Self-pay | Admitting: Physical Medicine and Rehabilitation

## 2023-07-11 ENCOUNTER — Telehealth: Payer: Self-pay

## 2023-07-11 MED ORDER — OXYCODONE HCL 5 MG PO TABS
5.0000 mg | ORAL_TABLET | Freq: Two times a day (BID) | ORAL | 0 refills | Status: DC | PRN
  Filled 2023-07-11: qty 60, 30d supply, fill #0

## 2023-07-11 NOTE — Telephone Encounter (Signed)
Patient called in requesting to know if results have been received and is requesting a rx for pain

## 2023-07-11 NOTE — Telephone Encounter (Signed)
Elysza from speech therapy is requested extended speech therapy once a week for 3 weeks to work on dysphasia and also a verbal approval for occupational therapy, can this be approved ?

## 2023-07-21 ENCOUNTER — Encounter (HOSPITAL_COMMUNITY): Payer: Self-pay

## 2023-07-21 ENCOUNTER — Other Ambulatory Visit: Payer: Self-pay

## 2023-07-21 ENCOUNTER — Emergency Department (HOSPITAL_COMMUNITY): Payer: Medicare Other

## 2023-07-21 ENCOUNTER — Emergency Department (HOSPITAL_COMMUNITY)
Admission: EM | Admit: 2023-07-21 | Discharge: 2023-07-21 | Disposition: A | Discharge status: Home / Self Care | Payer: Medicare Other | Attending: Emergency Medicine | Admitting: Emergency Medicine

## 2023-07-21 DIAGNOSIS — R42 Dizziness and giddiness: Secondary | ICD-10-CM | POA: Insufficient documentation

## 2023-07-21 DIAGNOSIS — R531 Weakness: Secondary | ICD-10-CM | POA: Diagnosis not present

## 2023-07-21 DIAGNOSIS — Z7984 Long term (current) use of oral hypoglycemic drugs: Secondary | ICD-10-CM | POA: Diagnosis not present

## 2023-07-21 DIAGNOSIS — R519 Headache, unspecified: Secondary | ICD-10-CM | POA: Diagnosis not present

## 2023-07-21 DIAGNOSIS — J45909 Unspecified asthma, uncomplicated: Secondary | ICD-10-CM | POA: Insufficient documentation

## 2023-07-21 DIAGNOSIS — R079 Chest pain, unspecified: Secondary | ICD-10-CM | POA: Diagnosis present

## 2023-07-21 DIAGNOSIS — Z79899 Other long term (current) drug therapy: Secondary | ICD-10-CM | POA: Insufficient documentation

## 2023-07-21 DIAGNOSIS — E119 Type 2 diabetes mellitus without complications: Secondary | ICD-10-CM | POA: Insufficient documentation

## 2023-07-21 DIAGNOSIS — Z7901 Long term (current) use of anticoagulants: Secondary | ICD-10-CM | POA: Diagnosis not present

## 2023-07-21 DIAGNOSIS — R0789 Other chest pain: Secondary | ICD-10-CM | POA: Insufficient documentation

## 2023-07-21 DIAGNOSIS — I509 Heart failure, unspecified: Secondary | ICD-10-CM | POA: Diagnosis not present

## 2023-07-21 LAB — HCG, SERUM, QUALITATIVE: Preg, Serum: NEGATIVE

## 2023-07-21 LAB — CBC
HCT: 39.4 % (ref 36.0–46.0)
Hemoglobin: 12.5 g/dL (ref 12.0–15.0)
MCH: 27.4 pg (ref 26.0–34.0)
MCHC: 31.7 g/dL (ref 30.0–36.0)
MCV: 86.2 fL (ref 80.0–100.0)
Platelets: 263 10*3/uL (ref 150–400)
RBC: 4.57 MIL/uL (ref 3.87–5.11)
RDW: 13.5 % (ref 11.5–15.5)
WBC: 7.7 10*3/uL (ref 4.0–10.5)
nRBC: 0 % (ref 0.0–0.2)

## 2023-07-21 LAB — BASIC METABOLIC PANEL
Anion gap: 8 (ref 5–15)
BUN: 8 mg/dL (ref 6–20)
CO2: 24 mmol/L (ref 22–32)
Calcium: 9.6 mg/dL (ref 8.9–10.3)
Chloride: 102 mmol/L (ref 98–111)
Creatinine, Ser: 0.61 mg/dL (ref 0.44–1.00)
GFR, Estimated: >60 mL/min (ref >60)
Glucose, Bld: 97 mg/dL (ref 70–99)
Potassium: 4 mmol/L (ref 3.5–5.1)
Sodium: 134 mmol/L — ABNORMAL LOW (ref 135–145)

## 2023-07-21 LAB — URINALYSIS, ROUTINE W REFLEX MICROSCOPIC
Bilirubin Urine: NEGATIVE
Glucose, UA: NEGATIVE mg/dL
Hgb urine dipstick: NEGATIVE
Ketones, ur: 5 mg/dL — AB
Leukocytes,Ua: NEGATIVE
Nitrite: NEGATIVE
Protein, ur: NEGATIVE mg/dL
Specific Gravity, Urine: 1.028 (ref 1.005–1.030)
pH: 7 (ref 5.0–8.0)

## 2023-07-21 LAB — TROPONIN I (HIGH SENSITIVITY)
Troponin I (High Sensitivity): 9 ng/L (ref <18)
Troponin I (High Sensitivity): 10 ng/L (ref <18)

## 2023-07-21 MED ORDER — METOCLOPRAMIDE HCL 5 MG/ML IJ SOLN
10.0000 mg | Freq: Once | INTRAMUSCULAR | Status: AC
  Administered 2023-07-21: 10 mg via INTRAVENOUS
  Filled 2023-07-21: qty 2

## 2023-07-21 MED ORDER — MORPHINE SULFATE (PF) 4 MG/ML IV SOLN
4.0000 mg | Freq: Once | INTRAVENOUS | Status: AC
  Administered 2023-07-21: 4 mg via INTRAVENOUS
  Filled 2023-07-21: qty 1

## 2023-07-21 MED ORDER — DEXAMETHASONE SODIUM PHOSPHATE 4 MG/ML IJ SOLN
4.0000 mg | Freq: Once | INTRAMUSCULAR | Status: AC
  Administered 2023-07-21: 4 mg via INTRAVENOUS
  Filled 2023-07-21: qty 1

## 2023-07-21 MED ORDER — LIDOCAINE 5 % EX PTCH
1.0000 | MEDICATED_PATCH | CUTANEOUS | Status: DC
  Administered 2023-07-21: 1 via TRANSDERMAL
  Filled 2023-07-21: qty 1

## 2023-07-21 MED ORDER — IOHEXOL 350 MG/ML SOLN
75.0000 mL | Freq: Once | INTRAVENOUS | Status: AC | PRN
  Administered 2023-07-21: 80 mL via INTRAVENOUS

## 2023-07-21 MED ORDER — MECLIZINE HCL 25 MG PO TABS
25.0000 mg | ORAL_TABLET | Freq: Three times a day (TID) | ORAL | 0 refills | Status: AC | PRN
  Filled 2023-07-21: qty 42, 14d supply, fill #0

## 2023-07-21 MED ORDER — DIPHENHYDRAMINE HCL 50 MG/ML IJ SOLN
12.5000 mg | Freq: Once | INTRAMUSCULAR | Status: AC
  Administered 2023-07-21: 12.5 mg via INTRAVENOUS
  Filled 2023-07-21: qty 1

## 2023-07-21 MED ORDER — SODIUM CHLORIDE 0.9 % IV BOLUS
1000.0000 mL | Freq: Once | INTRAVENOUS | Status: AC
  Administered 2023-07-21: 1000 mL via INTRAVENOUS

## 2023-07-21 MED ORDER — MECLIZINE HCL 25 MG PO TABS
25.0000 mg | ORAL_TABLET | Freq: Once | ORAL | Status: AC
  Administered 2023-07-21: 25 mg via ORAL
  Filled 2023-07-21: qty 1

## 2023-07-21 NOTE — ED Triage Notes (Signed)
Pt arrived via [facility] from home for c/o dizziness, generalized weakness, SOB, HA and midsternal chest painx3d. Pt is eupneic. Pt has left sided weakness from previous stroke. Pt is A&Ox4

## 2023-07-21 NOTE — Discharge Instructions (Addendum)
As discussed, your labs and imaging are reassuring.  I have sent a prescription of Meclizine to your pharmacy that you can take up to 3x a day for dizziness.  Make sure you are drinking plenty of fluid each day. This can help with headaches, generalized weakness, and dizziness.   You can use over the counter lidocaine patches to help with the chest pain.  Follow-up with your primary care provider in the next several days for reevaluation of your symptoms.  Get help right away if: Your chest pain is worse. You have a cough that gets worse, or you cough up blood. You have very bad (severe) pain in your belly (abdomen). You pass out (faint). You have either of these for no clear reason: Sudden chest discomfort. Sudden discomfort in your arms, back, neck, or jaw. You have shortness of breath at any time. You suddenly start to sweat, or your skin gets clammy. You feel sick to your stomach (nauseous). You throw up (vomit). You suddenly feel lightheaded or dizzy. You feel very weak or tired. Your heart starts to beat fast, or it feels like it is skipping beats.

## 2023-07-21 NOTE — ED Provider Notes (Incomplete)
[place] EMERGENCY DEPARTMENT AT [facility] Provider Note   CSN: [id number] Arrival date & time: 07/21/23  1243     History {Add pertinent medical, surgical, social history, OB history to HPI:1} Chief Complaint  Patient presents with   Weakness   Chest Pain   Shortness of Breath   Near Syncope    [name] is a 47 y.o. 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  Patient reports chest pain with shortness of breath, generalized weakness, dizziness, and headache for the past 3 days.  She states that the symptoms have been persistent since onset.  The chest pain is located at the sternum and radiates to the back.  Pain is worse with deep inspiration.  Patient states that she was sitting at home when the symptoms started.  She has taken Tylenol without relief.  Additionally she reports migraine that wraps around her head with associated dizziness with movement and nausea. She denies any vision changes, slurred speech, or worsening weakness.  Patient is not ambulatory since her previous stroke that left her hemiplegic.  No recent head injuries.  She takes Pradaxa as prescribed not missed any recent doses.  No abdominal pain, vomiting, dysuria, or diarrhea.  No additional complaints or concerns at this time.    Home Medications Prior to Admission medications   Medication Sig Start Date End Date Taking? Authorizing Provider  meclizine (ANTIVERT) 25 MG tablet Take 1 tablet (25 mg total) by mouth 3 (three) times daily as needed for up to 14 days for dizziness. 07/21/23 08/04/23 Yes Maxwell Marion, PA-C  Accu-Chek Softclix Lancets lancets USE 1  TO CHECK GLUCOSE 4 TIMES DAILY 01/12/23   Carney Living, MD  Accu-Chek Softclix Lancets lancets Use each in the morning, at noon, and at bedtime. 07/04/23   Raulkar, Drema Pry, MD  acetaminophen (TYLENOL) 500 MG tablet Take  500-1,000 mg by mouth every 6 (six) hours as needed for mild pain or headache (OR CRAMPING).    [provider]  Acetaminophen Extra Strength 500 MG TABS Take 1 tablet (500 mg total) by mouth every 6 (six) hours as needed for pain. 06/02/23     albuterol (VENTOLIN HFA) 108 (90 Base) MCG/ACT inhaler Inhale 2 puffs into the lungs every 6 (six) hours as needed for wheezing or shortness of breath. 01/23/23   Levin Erp, MD  albuterol (VENTOLIN HFA) 108 (90 Base) MCG/ACT inhaler Inhale 2 puffs into the lungs every 6 (six) hours as needed for shortness of breath. 06/02/23     amantadine (SYMMETREL) 100 MG capsule Take 2 capsules (200 mg total) by mouth daily. 04/17/23   McDiarmid, Leighton Roach, MD  apixaban (ELIQUIS) 5 MG TABS tablet Take 1 tablet (5 mg total) by mouth 2 (two) times daily. 04/17/23   McDiarmid, Leighton Roach, MD  atorvastatin (LIPITOR) 80 MG tablet Take 1 tablet (80 mg total) by mouth daily after supper. 04/17/23   McDiarmid, Leighton Roach, MD  atorvastatin (LIPITOR) 80 MG tablet Take 1 tablet (80 mg total) by mouth daily with evening meal for hyperlipidemia. 06/02/23     bisacodyl 5 MG EC tablet Take 2 tablets (10 mg total) by mouth every 24 hours as needed for constipation. 06/02/23     blood glucose meter kit and supplies Use up to 4 (four) times daily as directed 07/04/23   Raulkar, Drema Pry, MD  Blood Glucose Monitoring Suppl (  ACCU-CHEK GUIDE) w/Device KIT Use up to 4 (four) times daily. 07/04/23   Raulkar, Drema Pry, MD  Cholecalciferol (VITAMIN D) 125 MCG (5000 UT) CAPS Take one capsule by mouth once daily. 04/17/23   McDiarmid, Leighton Roach, MD  Cholecalciferol (VITAMIN D3) 125 MCG (5000 UT) CAPS Take 1 capsule (5,000 Units total) by mouth daily. 06/02/23     cyclobenzaprine (FLEXERIL) 5 MG tablet Take 1 tablet (5 mg total) by mouth 3 (three) times daily as needed for muscle spasms. 07/04/23   Raulkar, Drema Pry, MD  dabigatran (PRADAXA) 150 MG CAPS capsule Take 1 capsule (150 mg total) by mouth 2 (two)  times daily for anticoagulant. 06/02/23     dapagliflozin propanediol (FARXIGA) 10 MG TABS tablet Take 1 tablet (10 mg total) by mouth daily for diabetes. 06/02/23     empagliflozin (JARDIANCE) 25 MG TABS tablet Take 1 tablet (25 mg total) by mouth daily. 04/17/23   McDiarmid, Leighton Roach, MD  escitalopram (LEXAPRO) 10 MG tablet Take 1 tablet (10 mg total) by mouth at bedtime. 04/17/23   McDiarmid, Leighton Roach, MD  escitalopram (LEXAPRO) 10 MG tablet Take 1 tablet (10 mg total) by mouth at bedtime for depression. 06/02/23     glipiZIDE (GLUCOTROL) 5 MG tablet Take 1 tablet (5 mg total) by mouth daily before breakfast. 04/17/23   McDiarmid, Leighton Roach, MD  glipiZIDE (GLUCOTROL) 5 MG tablet Take 1 tablet (5 mg total) by mouth in the morning before breakfast for diabetes. 06/02/23     glucose blood (ACCU-CHEK GUIDE) test strip USE  4 TIMES DAILY 01/12/23   Ganta, Anupa, DO  hydrOXYzine (ATARAX) 10 MG tablet Take 1 tablet (10 mg total) by mouth 3 (three) times daily as needed. Patient taking differently: Take 10 mg by mouth 3 (three) times daily as needed for anxiety. 02/21/23   Raulkar, Drema Pry, MD  hydrOXYzine (ATARAX) 10 MG tablet Take 1 tablet (10 mg total) by mouth every 8 (eight) hours as needed for itching. 06/02/23     Lancet Device MISC Check in the morning, at noon, and at bedtime. 07/04/23 08/03/23  Horton Chin, MD  levETIRAcetam (KEPPRA) 750 MG tablet Take 1 tablet (750 mg total) by mouth 2 (two) times daily for seizures. 06/02/23     losartan (COZAAR) 50 MG tablet Take 1 tablet (50 mg total) by mouth daily. 04/17/23   McDiarmid, Leighton Roach, MD  losartan (COZAAR) 50 MG tablet Take 1 tablet (50 mg total) by mouth daily for hypertension  Hold if SBP is <110 or DBP <60. 06/02/23     megestrol (MEGACE) 400 MG/10ML suspension Take 10 mLs (400 mg total) by mouth 2 (two) times daily. 12/07/22   Ganta, Anupa, DO  melatonin 5 MG TABS Take 1 tablet (5 mg total) by mouth at bedtime. 04/17/23   McDiarmid, Leighton Roach, MD  metoprolol  tartrate (LOPRESSOR) 25 MG tablet Take 1 tablet (25 mg total) by mouth 2 (two) times daily. 04/17/23   McDiarmid, Leighton Roach, MD  metoprolol tartrate (LOPRESSOR) 25 MG tablet Take 1 tablet (25 mg total) by mouth 2 (two) times daily for hypertension. Hold if SBP is <110 or DBP <60 06/02/23     oxyCODONE (ROXICODONE) 5 MG immediate release tablet Take 1 tablet (5 mg total) by mouth 2 (two) times daily as needed for severe pain (pain score 7-10). 07/11/23   Raulkar, Drema Pry, MD  pregabalin (LYRICA) 75 MG capsule Take 1 capsule (75 mg total) by mouth 2 (two) times  daily as needed for pain. 01/20/23     spironolactone (ALDACTONE) 25 MG tablet Take 1 tablet (25 mg total) by mouth daily. 04/17/23   McDiarmid, Leighton Roach, MD  spironolactone (ALDACTONE) 25 MG tablet Take 1 tablet (25 mg total) by mouth daily for heart failure. 06/02/23     torsemide (DEMADEX) 20 MG tablet Take 1 tablet (20 mg total) by mouth daily. 01/20/23   Levin Erp, MD  torsemide (DEMADEX) 20 MG tablet Take 1 tablet (20 mg total) by mouth daily for heart failure. 06/02/23     traZODone (DESYREL) 150 MG tablet Take 1 tablet (150 mg total) by mouth at bedtime for insomnia. 06/02/23     traZODone (DESYREL) 50 MG tablet Take 1 tablet (50 mg total) by mouth at bedtime. 04/17/23   McDiarmid, Leighton Roach, MD  carvedilol (COREG) 3.125 MG tablet Take 1 tablet (3.125 mg total) by mouth 2 (two) times daily with a meal. 02/27/22 07/01/22  Meredeth Ide, MD      Allergies    Fish allergy, Shellfish allergy, Metformin, Metformin and related, Nsaids, and Trulicity [dulaglutide]    Review of Systems   Review of Systems  Cardiovascular:  Positive for chest pain and near-syncope.  Neurological:  Positive for weakness.  All other systems reviewed and are negative.   Physical Exam Updated Vital Signs BP (!) 177/112   Pulse 82   Temp 97.7 F (36.5 C) (Oral)   Resp 18   Ht 4\' 11"  (1.499 m)   Wt 55.2 kg   SpO2 100%   BMI 24.58 kg/m  Physical Exam Vitals and  nursing note reviewed.  Constitutional:      General: She is not in acute distress.    Appearance: Normal appearance.  HENT:     Head: Normocephalic and atraumatic.     Mouth/Throat:     Mouth: Mucous membranes are moist.  Eyes:     Extraocular Movements: Extraocular movements intact.     Conjunctiva/sclera: Conjunctivae normal.     Pupils: Pupils are equal, round, and reactive to light.     Comments: No nystagmus.  Cardiovascular:     Rate and Rhythm: Normal rate and regular rhythm.     Pulses: Normal pulses.     Heart sounds: Normal heart sounds.  Pulmonary:     Effort: Pulmonary effort is normal.     Breath sounds: Normal breath sounds.  Abdominal:     Palpations: Abdomen is soft.     Tenderness: There is no abdominal tenderness.  Musculoskeletal:        General: Normal range of motion.     Comments: Tenderness to palpation of back at the thoracic spine. ROM, strength, and sensation of right upper and lower extremity intact.  Skin:    General: Skin is warm and dry.     Findings: No rash.  Neurological:     General: No focal deficit present.     Mental Status: She is alert. Mental status is at baseline.     Comments: Left sided weakness at baseline. She is non-ambulatory at baseline.  Psychiatric:        Mood and Affect: Mood normal.        Behavior: Behavior normal.    ED Results / Procedures / Treatments   Labs (all labs ordered are listed, but only abnormal results are displayed) Labs Reviewed  BASIC METABOLIC PANEL - Abnormal; Notable for the following components:      Result Value   Sodium 134 (*)  All other components within normal limits  URINALYSIS, ROUTINE W REFLEX MICROSCOPIC - Abnormal; Notable for the following components:   Color, Urine STRAW (*)    Ketones, ur 5 (*)    All other components within normal limits  CBC  HCG, SERUM, QUALITATIVE  TROPONIN I (HIGH SENSITIVITY)  TROPONIN I (HIGH SENSITIVITY)    EKG EKG  Interpretation Date/Time:  Friday July 21 2023 13:00:48 EST Ventricular Rate:  81 PR Interval:  59 QRS Duration:  96 QT Interval:  392 QTC Calculation: 455 R Axis:   -27  Text Interpretation: Sinus rhythm Short PR interval Biatrial enlargement Left ventricular hypertrophy Artifact in lead(s) I II aVR no sig change from previous Confirmed by Arby Barrette [phone]) on 07/21/2023 1:37:52 PM  Radiology CT Angio Chest/Abd/Pel for Dissection W and/or Wo Contrast Result Date: 07/21/2023 CLINICAL DATA:  Weakness, chest pain, short of breath, near syncope EXAM: CT ANGIOGRAPHY CHEST, ABDOMEN AND PELVIS TECHNIQUE: Non-contrast CT of the chest was initially obtained. Multidetector CT imaging through the chest, abdomen and pelvis was performed using the standard protocol during bolus administration of intravenous contrast. Multiplanar reconstructed images and MIPs were obtained and reviewed to evaluate the vascular anatomy. RADIATION DOSE REDUCTION: This exam was performed according to the departmental dose-optimization program which includes automated exposure control, adjustment of the mA and/or kV according to patient size and/or use of iterative reconstruction technique. CONTRAST:  80mL OMNIPAQUE IOHEXOL 350 MG/ML SOLN COMPARISON:  09/22/2022, 11/18/2022 FINDINGS: CTA CHEST FINDINGS Cardiovascular: No evidence of thoracic aortic aneurysm or dissection. Stable mild cardiomegaly with prominent left ventricular dilatation. There is technically adequate opacification of the pulmonary vasculature. No filling defects or pulmonary emboli. Mediastinum/Nodes: Since the prior chest CT, soft tissue density has developed within the anterior mediastinum, measuring up to 1.8 x 2.7 cm. This could reflect thymic tissue, either rebound or neoplastic, or anterior mediastinal adenopathy. The thyroid, trachea, and esophagus are stable. Lungs/Pleura: No acute airspace disease, effusion, or pneumothorax. Central airways are  patent. Musculoskeletal: There are no acute or destructive bony abnormalities. Reconstructed images demonstrate no additional findings. Review of the MIP images confirms the above findings. CTA ABDOMEN AND PELVIS FINDINGS VASCULAR Aorta: Normal caliber aorta without aneurysm, dissection, vasculitis or significant stenosis. Mild atherosclerosis. Celiac: Patent without evidence of aneurysm, dissection, vasculitis or significant stenosis. SMA: Patent without evidence of aneurysm, dissection, vasculitis or significant stenosis. Renals: Both renal arteries are patent without evidence of aneurysm, dissection, vasculitis, fibromuscular dysplasia or significant stenosis. IMA: Patent without evidence of aneurysm, dissection, vasculitis or significant stenosis. Inflow: Patent without evidence of aneurysm, dissection, vasculitis or significant stenosis. Veins: There is reflux of contrast into the hepatic veins, suggesting cardiac dysfunction. No other obvious venous abnormality within the limitations of this arterial phase study. Review of the MIP images confirms the above findings. NON-VASCULAR Hepatobiliary: No focal liver abnormality is seen. No gallstones, gallbladder wall thickening, or biliary dilatation. Pancreas: Unremarkable. No pancreatic ductal dilatation or surrounding inflammatory changes. Spleen: Normal in size without focal abnormality. Adrenals/Urinary Tract: Adrenal glands are unremarkable. Kidneys are normal, without renal calculi, focal lesion, or hydronephrosis. Bladder is unremarkable. Stomach/Bowel: No bowel obstruction or ileus. No bowel wall thickening or inflammatory change. Lymphatic: No pathologic adenopathy within the abdomen or pelvis. Reproductive: Uterus and bilateral adnexa are unremarkable. Other: No free fluid or free intraperitoneal gas. No abdominal wall hernia. Musculoskeletal: No acute or destructive bony abnormalities. Reconstructed images demonstrate no additional findings. Review of the  MIP images confirms the above findings. IMPRESSION: Vascular: 1.  No evidence of thoracoabdominal aortic aneurysm or dissection. 2. No evidence of pulmonary embolus. 3.  Aortic Atherosclerosis (ICD10-I70.0). Nonvascular: 1. New soft tissue density within the anterior mediastinum since prior chest CT, which could reflect thymic rebound, thymic neoplasia, or lymphadenopathy. 2. Cardiomegaly with chronic left ventricular dilatation. 3. Otherwise no acute intrathoracic, intra-abdominal, or intrapelvic process. Electronically Signed   By: Sharlet Salina M.D.   On: 07/21/2023 19:08   CT HEAD WO CONTRAST Result Date: 07/21/2023 CLINICAL DATA:  Stroke, follow up headache, history of stroke EXAM: CT HEAD WITHOUT CONTRAST TECHNIQUE: Contiguous axial images were obtained from the base of the skull through the vertex without intravenous contrast. RADIATION DOSE REDUCTION: This exam was performed according to the departmental dose-optimization program which includes automated exposure control, adjustment of the mA and/or kV according to patient size and/or use of iterative reconstruction technique. COMPARISON:  CT head September 26, 2022. MRI head March 3, 24. FINDINGS: Brain: Expected evolution of the prior right MCA and ACA territory infarcts with developing encephalomalacia in these regions. No evidence of acute large vascular territory infarct, acute hemorrhage, mass lesion or midline shift. No hydrocephalus. Vascular: No hyperdense vessel identified. Skull: No acute fracture. Sinuses/Orbits: Clear sinuses.  No acute orbital findings. Other: No mastoid effusions. IMPRESSION: 1. Expected evolution of the prior right MCA and ACA territory infarcts. 2. No evidence of new/interval acute abnormality. Electronically Signed   By: Feliberto Harts M.D.   On: 07/21/2023 16:41   DG Chest Portable 1 View Result Date: 07/21/2023 CLINICAL DATA:  Chest pain. EXAM: PORTABLE CHEST 1 VIEW COMPARISON:  Chest radiograph dated March 08, 2023. FINDINGS: Stable cardiomegaly. No focal consolidation, pleural effusion, or pneumothorax. Similar inferior subluxation of the left humeral head, which may relate to chronic rotator cuff pathology or possible joint effusion. No acute osseous abnormality. IMPRESSION: 1. Cardiomegaly.  No acute cardiopulmonary findings. 2. Similar inferior subluxation of the left humeral head, which could be secondary to chronic rotator cuff pathology or possible joint effusion. Recommend correlation with physical exam. Electronically Signed   By: Hart Robinsons M.D.   On: 07/21/2023 16:15    Procedures Procedures: not indicated. {Document cardiac monitor, telemetry assessment procedure when appropriate:1}  Medications Ordered in ED Medications  lidocaine (LIDODERM) 5 % 1 patch (1 patch Transdermal Patch Applied 07/21/23 2039)  morphine (PF) 4 MG/ML injection 4 mg (4 mg Intravenous Given 07/21/23 1542)  metoCLOPramide (REGLAN) injection 10 mg (10 mg Intravenous Given 07/21/23 1540)  diphenhydrAMINE (BENADRYL) injection 12.5 mg (12.5 mg Intravenous Given 07/21/23 1541)  sodium chloride 0.9 % bolus 1,000 mL (0 mLs Intravenous Stopped 07/21/23 2008)  dexamethasone (DECADRON) injection 4 mg (4 mg Intravenous Given 07/21/23 1540)  meclizine (ANTIVERT) tablet 25 mg (25 mg Oral Given 07/21/23 1805)  iohexol (OMNIPAQUE) 350 MG/ML injection 75 mL (80 mLs Intravenous Contrast Given 07/21/23 1833)  morphine (PF) 4 MG/ML injection 4 mg (4 mg Intravenous Given 07/21/23 1857)    ED Course/ Medical Decision Making/ A&P   {   Click here for ABCD2, HEART and other calculatorsREFRESH Note before signing :1}                              Medical Decision Making Amount and/or Complexity of Data Reviewed Labs: ordered. Radiology: ordered.  Risk Prescription drug management.   This patient presents to the ED for concern of chest pain, this involves an extensive number of treatment options, and is a  complaint that  carries with it a high risk of complications and morbidity.   Differential diagnosis includes: ACS, aortic dissection, pulmonary embolism, pneumothorax, pneumonia, costochondritis, pleuritic chest pain, etc.   Comorbidities  See HPI above   Additional History  Additional history obtained from prior records.   Cardiac Monitoring / EKG  The patient was maintained on a cardiac monitor.  I personally viewed and interpreted the cardiac monitored which showed: sinus rhythm with a heart rate of 81 bpm.   Lab Tests  I ordered and personally interpreted labs.  The pertinent results include:   Negative troponin BMP and CBC unremarkable Negative pregnancy UA is within normal limits - no signs of infection   Imaging Studies  I ordered imaging studies including CT head, CTA dissection study, CXR  I independently visualized and interpreted imaging which showed:  CT head showed expected evolution of the prior right MCA and ACA territory infarcts.  No evidence of new/interval acute abnormality. No evidence of thoracoabdominal aortic aneurysm or dissection. No evidence of pulmonary embolism. I agree with the radiologist interpretation   Problem List / ED Course / Critical Interventions / Medication Management  Chest pain and dizziness I ordered medications including: Meclizine for dizziness Morphine, Reglan, Benadryl, Decadron, and normal saline for headache and chest pain Lidocaine patch also given prior to discharge Reevaluation of the patient after these medicines showed that the patient improved. Discussed findings with patient.  All questions were answered.   Social Determinants of Health  Physical activity   Test / Admission - Considered  She is stable and safe for discharge home.  Advise close follow-up with PCP. Return precautions provided.  {Document critical care time when appropriate:1} {Document review of labs and clinical decision tools ie heart score,  Chads2Vasc2 etc:1}  {Document your independent review of radiology images, and any outside records:1} {Document your discussion with family members, caretakers, and with consultants:1} {Document social determinants of health affecting pt's care:1} {Document your decision making why or why not admission, treatments were needed:1} Final Clinical Impression(s) / ED Diagnoses Final diagnoses:  Chest wall pain  Nonintractable episodic headache, unspecified headache type  Dizziness, nonspecific    Rx / DC Orders ED Discharge Orders          Ordered    meclizine (ANTIVERT) 25 MG tablet  3 times daily PRN        07/21/23 1957

## 2023-07-21 NOTE — ED Notes (Signed)
Patient transported to CT 

## 2023-07-23 ENCOUNTER — Other Ambulatory Visit (HOSPITAL_COMMUNITY): Payer: Self-pay

## 2023-07-23 NOTE — Telephone Encounter (Cosign Needed)
07/23/2023 at 1700 - called patient to inform her of incidental findings of CT of mediastinal soft tissue density.  Patient asked me to call her father and tell him as well because she would forget.  She gave me his phone number and I called him and inform of results.  Advised follow-up with primary care provider for reevaluation.  They are agreeable with this plan.

## 2023-07-24 ENCOUNTER — Other Ambulatory Visit: Payer: Self-pay

## 2023-07-24 ENCOUNTER — Other Ambulatory Visit (HOSPITAL_COMMUNITY): Payer: Self-pay

## 2023-07-24 NOTE — Progress Notes (Signed)
 Initial neurology clinic note  [name] MRN: [id number] DOB: Dec 05, 1975  Referring provider: Loistine Sober, NP  Primary care provider: Christia Budds, MD  Reason for consult:  Stroke  Subjective:  This is [name], a 47 y.o. right-handed female with a medical history of HTN, HLD, DM, stroke, HFrEF, afib, CAD, OSA, migraines who presents to neurology clinic with stroke. The patient is accompanied by dad.  Patient had acute onset left sided weakness on 09/24/22 while admitted for SOB 2/2 CHF. She had LV thrombus as well. Per H&P from 09/24/22: This is a 47 year old patient admitted with shortness of breath secondary to acute combined CHF exacerbation and also found to have an LV thrombus on heparin  drip until this morning on whom neurology is consulted for stroke code for left-sided weakness.  Other acute medical problems include acute on chronic combined systolic and diastolic heart failure, elevated troponin (elevated to 197 but trended down to 170, no EKG changes, felt to be likely demand ischemia secondary to CHF exacerbation), poorly controlled diabetes type 2 with last A1c 10.8, paroxysmal A-fib noncompliant with warfarin as an outpatient, hypertension.  Last known well was 12:40 PM, after that time patient developed left-sided weakness and right gaze deviation.  Rapid and then stroke code were activated.  CT head showed loss of gray-white differentiation in the right insula likely representing early infarct with an aspects of 9.  Patient was on heparin  drip until this morning for LV thrombus which was shut off at the time the stroke code was called.  Therefore she was not a candidate for TNK.  NIH stroke scale was 16.  Patient has no deficits at baseline.  CT angiogram showed a proximal right M2 occlusion.    TICI2B revascularization was achieved. Per D/C summary from 10/01/22: She had a brief cardiac arrest during the procedure but was resuscitated after 2 minutes. She  was initially on heparin  drip but now has been changed to Eliquis , which she will need to take indefinitely for cardiac thrombus and atrial fibrillation. She is doing well and is ready to be discharged to rehabilitation.  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  Aspects is 9 CTA head & neck:  Proximal right M2 occlusion with minimal collateral flow throughout the MCA distribution. IR - Occluded inferior division of the right middle cerebral artery in the mid M2 segment. TICI 2B revascularization.  Post IR CT: no evidence of intracranial hemorrhage.  MRI: Large area of acute infarct in the right MCA distribution, smaller area of acute infarct in the right ACA distribution, and punctate acute infarct in the left cerebellar hemisphere. No evidence of hemorrhagic transformation or midline shift. MRA head - Persistent occlusion of a right M2 branch with minimal collateral flow in the MCA distribution aside from in the region of the frontal operculum. CT repeat 3/4 stable right superior division MCA territory and distal right ACA territory infarcts with petechial hemorrhage. 2D Echo: Large LV apical thrombus present, LVEF 15% LDL 97 HgbA1c 10.8 VTE prophylaxis - SCDs, Eliquis  warfarin daily prior to admission (?compliance), now on eliquis  5mg  BID (heparin  off 3/8)    Neuro exam on discharge per D/C summary: NEURO:  Mental Status: AA&Ox3  Speech/Language: speech is with mild dysarthria   Cranial  Nerves:  II: PERRL.  III, IV, VI: Right gaze preference but can cross midline barely V: Sensation is intact to light touch and diminished on the left VII: Slight left facial droop VIII: hearing intact to voice. IX, X: Voice is quiet and slightly dysarthric XII: tongue is  midline without fasciculations. Motor: 5/5 strength to right upper and lower extremities, 0 out of 5 strength to left upper extremity, 1 out of 5 strength to left lower extremity Tone: is normal and bulk is normal Sensation- Intact to light touch bilaterally but diminished on the left Gait- deferred   Patient went to inpatient rehab from 10/01/22 to 11/01/22. Per d/c summary from 11/01/22: Hospital Course: [name] was admitted to rehab 10/01/2022 for inpatient therapies to consist of PT, ST and OT at least three hours five days a week. Past admission physiatrist, therapy team and rehab RN have worked together to provide customized collaborative inpatient rehab. She continues on Eliquis  and follow up CBC shows H/H to be trending up due likely due to hemoconcentration. Her blood pressures were monitored on TID basis and has been stable. Serial labs were relatively stable till 04/08 when she was noted to have rise in BUN/SCr to 25/1.62. Her LOS was extended by a day for gentle hydration with IVF with follow up labs showing improvement in SCr to 1.42. Demadex , Spironolactone  and Jardiance  were held for 24 hours and recommend repeat BMET on follow up cardiology appointment the next day.  Vitamin D  supplement was added due to vitamin D  insufficiency.    She continues to report pain left side with shooting pains in arm and leg requiring use of MS IR as needed.  She also reports ongoing issues with spasms at night affecting overall sleep hygiene.  BLE dopplers done on 03/16 due to reports of BLE pain and were negative for DVT. Robaxin  has been used to help manage spasms and melatonin was added to help with sleep hygiene.  She was noted to have depressed mood therefore Lexapro  added for mood stabilization.  Team has been providing ego support as well as encouragement during his stay.  P.o. intake has varied due to dislike of hospital food and megace  was added to help stimulate appetite.  Family has provided  additional food from home to help with intake.  Patient's blood sugars were monitored on ACHS basis and glipizide  was added to help with blood sugar control.  Bowel program has been augmented to help manage constipation.   Amantadine  was added for activation and to help with attention to tasks. She has developed increased tone due to left hemiplegia has been managed with splinting, range of motion as well as local measures including K-pad and Sportscreme.  She has been advised to continue using her resting splints at night. She continues to be limited by left sided weakness with sensory deficits. Family has been educated on managing patient at wheelchair level. Patient has been educated on safe swallow strategies as well as utilization of external and internal aids to assist with memory and recall. She will continue to receive follow up HHPT, HHOT, HHST and HHRN by [facility] after discharge.     Rehab course: During patient's stay in rehab weekly team conferences were held to monitor patient's progress, set goals and discuss barriers to discharge. At admission, patient required mod to max assist with ADL tasks and max assist with mobility. She exhibited mild dysarthria with decreased in vocal intenstiy and needed modified diet due to mild  bolus holding with pocketing and decrease in bolus formation.  She  has had improvement in activity tolerance, balance, postural control as well as ability to compensate for deficits. She requires min assist for bathing tasks and for upper body dressing.  She requires mod assist for lower body dressing.  She requires min assist for transfers with tactile cues for sequencing with, weight shifting and for initiation.  She is able to ambulate 50 feet with max assist and use of a walker.  She continues to require supervision to min assist for cognition l.  She requires supervision at meals and has refused trials of regular textures.  She requested continuing dysphagia 3  diet and was discharged on this per her preference.   Family education has been completed regarding all aspects of physical and cognitive assistance needed after discharge.  Family was also advised to manage and administer medications at discharge.   Disposition: Home.   Patient continues to get physical therapy and speech therapy in her home. She is not sure she is still making progress.   She has left sided weakness. She has difficulty with memory. She also has a lot of headaches. It is in the back or all over her head. She rates the pain 9/10. It is a throbbing pain that she calls a migraine. It can last 3 days. She had rare migraines prior to the stroke. This occurs a few times per month. There is associated dizziness and nausea.  She has photophobia and phonophobia as well. She will take Goody's headache powder that helps sometimes. She was given meclizine  that helps with the dizziness part of the migraine.  She is no longer on eliquis  but was switched to Dabigatran  a few months ago. She continues to take atorvastatin  90 mg daily.  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 She is on Keppra  750 mg BID. She has not had any seizures since being on Keppra .  She is a never smoker.  Patient has OSA but lost her CPAP. Per Dad, she wasn't using it when she had it. She does not have a sleep medicine doctor.  Of note, CTA chest/abd/pelvis at ED on 07/21/23 showed anterior mediastinum soft tissue density. PCP is following with a repeat CT chest in 6-8 weeks.  MEDICATIONS:  Outpatient Encounter Medications as of 08/03/2023  Medication Sig Note   Accu-Chek Softclix Lancets lancets USE 1  TO CHECK GLUCOSE 4 TIMES DAILY    Accu-Chek Softclix Lancets lancets Use each in the morning, at noon, and at bedtime.    acetaminophen  (TYLENOL ) 500 MG tablet Take 500-1,000 mg by mouth every 6 (six) hours as needed for mild pain or headache (OR CRAMPING).    albuterol   (VENTOLIN  HFA) 108 (90 Base) MCG/ACT inhaler Inhale 2 puffs into the lungs every 6 (six) hours as needed for wheezing or shortness of breath.    atorvastatin  (LIPITOR ) 80 MG tablet Take 1 tablet (80 mg total) by mouth daily after supper.    bisacodyl  5 MG EC tablet Take 2 tablets (10 mg total) by mouth every 24 hours as needed for constipation.    blood glucose meter kit and supplies Use up to 4 (four) times daily as directed    Blood Glucose Monitoring Suppl (ACCU-CHEK GUIDE) w/Device KIT Use up to 4 (four) times daily.    Cholecalciferol  (VITAMIN D ) 125 MCG (5000 UT) CAPS Take one capsule by mouth once daily.    cyclobenzaprine  (FLEXERIL ) 5  MG tablet Take 1 tablet (5 mg total) by mouth 3 (three) times daily as needed for muscle spasms.    dabigatran  (PRADAXA ) 150 MG CAPS capsule Take 1 capsule (150 mg total) by mouth 2 (two) times daily for anticoagulant.    dapagliflozin  propanediol (FARXIGA ) 10 MG TABS tablet Take 1 tablet (10 mg total) by mouth daily for diabetes.    empagliflozin  (JARDIANCE ) 25 MG TABS tablet Take 1 tablet (25 mg total) by mouth daily.    escitalopram  (LEXAPRO ) 20 MG tablet Take 1 tablet (20 mg total) by mouth at bedtime.    glipiZIDE  (GLUCOTROL ) 5 MG tablet Take 1 tablet (5 mg total) by mouth daily before breakfast.    glucose blood (ACCU-CHEK GUIDE) test strip USE  4 TIMES DAILY    hydrOXYzine  (ATARAX ) 10 MG tablet Take 1 tablet (10 mg total) by mouth every 8 (eight) hours as needed for itching.    hydrOXYzine  (ATARAX ) 10 MG tablet Take 1 tablet (10 mg total) by mouth 3 (three) times daily as needed for anxiety.    Lancet Device MISC Check in the morning, at noon, and at bedtime.    levETIRAcetam  (KEPPRA ) 750 MG tablet Take 1 tablet (750 mg total) by mouth 2 (two) times daily for seizures.    losartan  (COZAAR ) 50 MG tablet Take 1 tablet (50 mg total) by mouth daily.    meclizine  (ANTIVERT ) 25 MG tablet Take 1 tablet (25 mg total) by mouth 3 (three) times daily as needed for  up to 14 days for dizziness.    metoprolol  tartrate (LOPRESSOR ) 25 MG tablet Take 1 tablet (25 mg total) by mouth 2 (two) times daily.    oxyCODONE  (ROXICODONE ) 5 MG immediate release tablet Take 1 tablet (5 mg total) by mouth 2 (two) times daily as needed for severe pain (pain score 7-10).    spironolactone  (ALDACTONE ) 25 MG tablet Take 1 tablet (25 mg total) by mouth daily.    torsemide  (DEMADEX ) 20 MG tablet Take 1 tablet (20 mg total) by mouth daily.    traZODone  (DESYREL ) 150 MG tablet Take 1 tablet (150 mg total) by mouth at bedtime for insomnia.    [DISCONTINUED] Acetaminophen  Extra Strength 500 MG TABS Take 1 tablet (500 mg total) by mouth every 6 (six) hours as needed for pain.    [DISCONTINUED] metoprolol  tartrate (LOPRESSOR ) 25 MG tablet Take 1 tablet (25 mg total) by mouth 2 (two) times daily for hypertension. Hold if SBP is <110 or DBP <60    [DISCONTINUED] spironolactone  (ALDACTONE ) 25 MG tablet Take 1 tablet (25 mg total) by mouth daily for heart failure.    [DISCONTINUED] torsemide  (DEMADEX ) 20 MG tablet Take 1 tablet (20 mg total) by mouth daily for heart failure.    megestrol  (MEGACE ) 400 MG/10ML suspension Take 10 mLs (400 mg total) by mouth 2 (two) times daily. (Patient not taking: Reported on 08/03/2023)    melatonin 5 MG TABS Take 1 tablet (5 mg total) by mouth at bedtime. (Patient not taking: Reported on 08/03/2023)    [DISCONTINUED] albuterol  (VENTOLIN  HFA) 108 (90 Base) MCG/ACT inhaler Inhale 2 puffs into the lungs every 6 (six) hours as needed for shortness of breath.    [DISCONTINUED] amantadine  (SYMMETREL ) 100 MG capsule Take 2 capsules (200 mg total) by mouth daily.    [DISCONTINUED] apixaban  (ELIQUIS ) 5 MG TABS tablet Take 1 tablet (5 mg total) by mouth 2 (two) times daily.    [DISCONTINUED] atorvastatin  (LIPITOR ) 80 MG tablet Take 1 tablet (80 mg  total) by mouth daily with evening meal for hyperlipidemia.    [DISCONTINUED] carvedilol  (COREG ) 3.125 MG tablet Take 1 tablet  (3.125 mg total) by mouth 2 (two) times daily with a meal. 07/01/2022: changed to Toprol  XL   [DISCONTINUED] Cholecalciferol  (VITAMIN D3) 125 MCG (5000 UT) CAPS Take 1 capsule (5,000 Units total) by mouth daily.    [DISCONTINUED] dabigatran  (PRADAXA ) 150 MG CAPS capsule Take 150 mg by mouth 2 (two) times daily.    [DISCONTINUED] escitalopram  (LEXAPRO ) 10 MG tablet Take 1 tablet (10 mg total) by mouth at bedtime.    [DISCONTINUED] escitalopram  (LEXAPRO ) 10 MG tablet Take 1 tablet (10 mg total) by mouth at bedtime for depression.    [DISCONTINUED] glipiZIDE  (GLUCOTROL ) 5 MG tablet Take 1 tablet (5 mg total) by mouth in the morning before breakfast for diabetes.    [DISCONTINUED] hydrOXYzine  (ATARAX ) 10 MG tablet Take 1 tablet (10 mg total) by mouth 3 (three) times daily as needed. (Patient taking differently: Take 10 mg by mouth 3 (three) times daily as needed for anxiety.)    [DISCONTINUED] losartan  (COZAAR ) 50 MG tablet Take 1 tablet (50 mg total) by mouth daily for hypertension  Hold if SBP is <110 or DBP <60.    [DISCONTINUED] oxyCODONE  (ROXICODONE ) 5 MG immediate release tablet Take 1 tablet (5 mg total) by mouth 2 (two) times daily as needed for severe pain (pain score 7-10).    [DISCONTINUED] pregabalin  (LYRICA ) 75 MG capsule Take 1 capsule (75 mg total) by mouth 2 (two) times daily as needed for pain.    [DISCONTINUED] traZODone  (DESYREL ) 150 MG tablet Take 1 tablet (150 mg total) by mouth at bedtime for insomnia.    [DISCONTINUED] traZODone  (DESYREL ) 50 MG tablet Take 1 tablet (50 mg total) by mouth at bedtime.    No facility-administered encounter medications on file as of 08/03/2023.    PAST MEDICAL HISTORY: Past Medical History:  Diagnosis Date   Abnormal Pap smear    Acute combined systolic and diastolic heart failure (HCC) 02/19/2019   Wt Readings from Last 3 Encounters:  09/18/21  84.7 kg  09/02/21  84.4 kg  08/31/21  84.2 kg         Acute heart failure (HCC) 01/29/2022   Acute on  chronic combined systolic and diastolic CHF (congestive heart failure) (HCC) 02/19/2019   Acute on chronic congestive heart failure (HCC)    Acute on chronic congestive heart failure (HCC)    Acute on chronic diastolic CHF (congestive heart failure) (HCC) 10/19/2021   Acute on chronic systolic heart failure (HCC) 02/22/2022   Anemia    Asthma    Atrial fibrillation (HCC)    history of paroxysmal atrial fibrillation   CHF (congestive heart failure) (HCC) 11/02/2021   CHF exacerbation (HCC) 10/17/2021   Diabetes mellitus    DKA, type 2 (HCC) 02/06/2019   Elevated troponin 09/30/2019   Heart failure with reduced ejection fraction (HCC)    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  Consider chronic etiology given continued fertility issues   Hypertension    Hypertensive emergency 09/30/2019   Hypertensive urgency 06/02/2022   Major depressive disorder 02/08/2012   Reports that her infertility is a major cause of her depression Reports Prozac  has worked in the past for her.     Migraines    Morbid obesity (HCC) 02/06/2019   Nausea & vomiting 06/26/2022   Nonischemic cardiomyopathy (HCC) 09/22/2021  NSTEMI (non-ST elevated myocardial infarction) (HCC) 02/23/2022   Obstructive sleep apnea 06/30/2015   Polycystic ovarian disease     PAST SURGICAL HISTORY: Past Surgical History:  Procedure Laterality Date   BIOPSY  11/05/2021   Procedure: BIOPSY;  Surgeon: Stacia Glendia BRAVO, MD;  Location: [facility] ENDOSCOPY;  Service: Gastroenterology;;   CERVIX LESION DESTRUCTION     CESAREAN SECTION     ESOPHAGOGASTRODUODENOSCOPY (EGD) WITH PROPOFOL  N/A 11/05/2021   Procedure: ESOPHAGOGASTRODUODENOSCOPY (EGD) WITH PROPOFOL ;  Surgeon: Stacia Glendia BRAVO, MD;  Location: [facility] ENDOSCOPY;  Service: Gastroenterology;  Laterality: N/A;   IR CT HEAD LTD  09/24/2022   IR PERCUTANEOUS ART THROMBECTOMY/INFUSION INTRACRANIAL INC DIAG ANGIO  09/24/2022    RADIOLOGY WITH ANESTHESIA N/A 09/24/2022   Procedure: IR WITH ANESTHESIA;  Surgeon: Radiologist, Medication, MD;  Location: [facility] OR;  Service: Radiology;  Laterality: N/A;   RIGHT/LEFT HEART CATH AND CORONARY ANGIOGRAPHY N/A 03/29/2019   Procedure: RIGHT/LEFT HEART CATH AND CORONARY ANGIOGRAPHY;  Surgeon: Rolan Ezra RAMAN, MD;  Location: [facility] INVASIVE CV LAB;  Service: Cardiovascular;  Laterality: N/A;   RIGHT/LEFT HEART CATH AND CORONARY ANGIOGRAPHY N/A 09/20/2021   Procedure: RIGHT/LEFT HEART CATH AND CORONARY ANGIOGRAPHY;  Surgeon: Rolan Ezra RAMAN, MD;  Location: [facility] INVASIVE CV LAB;  Service: Cardiovascular;  Laterality: N/A;    ALLERGIES: Allergies  Allergen Reactions   Fish Allergy Other (See Comments) and Swelling    Can tolerate flounder, perch, and trout   Shellfish Allergy Anaphylaxis, Other (See Comments) and Swelling    Airway involvement including EMS - Has Epi-Pen  Other Reaction(s): Other (See Comments)   Metformin  Diarrhea   Metformin  And Related Diarrhea   Nsaids Other (See Comments)    Per PCP, interferes with daily meds (heart failure)  Other Reaction(s): Other (See Comments)   Trulicity  [Dulaglutide ] Nausea And Vomiting and Other (See Comments)    Multiple episodes of vomiting over multiple days    FAMILY HISTORY: Family History  Problem Relation Age of Onset   Diabetes Mother    Hypertension Mother    Hypertension Father    Diabetes Father     SOCIAL HISTORY: Social History   Tobacco Use   Smoking status: Never   Smokeless tobacco: Never   Tobacco comments:    Never smoke 10/22/21  Vaping Use   Vaping status: Never Used  Substance Use Topics   Alcohol use: No    Alcohol/week: 0.0 standard drinks of alcohol   Drug use: No   Social History   Social History Narrative   Are you right handed or left handed? right   Are you currently employed ?    What is your current occupation?   Do you live at home alone?   Who lives with you?    What type of home  do you live in: 1 story or 2 story?         Objective:  Vital Signs:  BP (!) 148/81 (BP Location: Right Arm, Cuff Size: Normal)   Pulse 84   Ht 4' 11 (1.499 m)   Wt 129 lb (58.5 kg)   SpO2 94%   BMI 26.05 kg/m   General: General appearance: Awake and alert. No distress. Cooperative with exam. Sitting in wheel chair Skin: No obvious rash or jaundice. HEENT: Atraumatic. Anicteric. Lungs: Non-labored breathing on room air   Neurological: Mental Status: Alert. Speech fluent. No pseudobulbar affect Cranial Nerves: CNII: No RAPD. Visual fields intact. CNIII, IV, VI: PERRL. No nystagmus. EOMI. CN V: Facial sensation  intact bilaterally to fine touch. Masseter clench strong. CN VII: Facial muscles symmetric and strong. No ptosis at rest. CN VIII: Hears finger rub well bilaterally. CN IX: No hypophonia. CN X: Palate elevates symmetrically. CN XI: Full strength shoulder shrug bilaterally. CN XII: Tongue protrusion full and midline. No atrophy or fasciculations. No significant dysarthria Motor: Tone is spastic in LUE > LLE. RUE and RLE 5/5. LUE 0/5. LLE 4/5  Reflexes:  Right Left   Bicep 2+ 3+   Tricep 2+ 3+   BrRad 2+ 3+   Knee 2+ 2+   Ankle 2+ 2+    Pathological Reflexes: Babinski: flexor response bilaterally Hoffman: absent bilaterally Sensation: Pinprick: Increased sensitivity on LUE and LLE compared to right side Neglect of left side to simultaneous touch Coordination: Intact finger-to- nose-finger on right. Gait: Walks with walker. Spastic gait (left), dragging left leg   Labs and Imaging review: Internal labs: Lab Results  Component Value Date   HGBA1C 7.8 (A) 12/29/2022   Lab Results  Component Value Date   VITAMINB12 1,470 (H) 06/29/2022   Lab Results  Component Value Date   TSH 3.269 09/24/2022   04/04/23: CMP significant for Na 132, glucose 154, tbili 2.3, alk phos 151 CBC unremarkable   HbA1c (12/29/22): 7.8   Lipid panel: Component      Latest Ref Rng 09/24/2022  Cholesterol     0 - 200 mg/dL 863   Triglycerides     <150 mg/dL 62   HDL Cholesterol     >40 mg/dL 27 (L)   Total CHOL/HDL Ratio     RATIO 5.0   VLDL     0 - 40 mg/dL 12   LDL (calc)     0 - 99 mg/dL 97       Imaging: CT head wo contrast and CTA head and neck (09/24/22): FINDINGS: CT HEAD FINDINGS   Brain: There is questionable loss of gray-white differentiation in the posterior insula suspicious for early infarct in the MCA distribution. The remainder of the MCA territory appears spared at this time. ASPECTS is 9.   There is no acute intracranial hemorrhage or extra-axial fluid collection.   Background parenchymal volume is normal. The ventricles are normal in size. Probable prominent perivascular spaces are noted in the bilateral basal ganglia.   The pituitary and suprasellar region are normal. There is no mass lesion. There is no mass effect or midline shift.   Vascular: See below.   Skull: Normal. Negative for fracture or focal lesion.   Sinuses/Orbits: There is mild mucosal thickening in the paranasal sinuses. There is a rightward gaze deviation.   Other: None.   Review of the MIP images confirms the above findings   CTA NECK FINDINGS   Aortic arch: The imaged aortic arch is normal. The origins of the major branch vessels are patent. The subclavian arteries are patent to the level imaged.   Right carotid system: The right common, internal, and external carotid arteries are patent with mild plaque in the common carotid artery and at the bifurcation but no hemodynamically significant stenosis or occlusion. There is no evidence of dissection or aneurysm.   Left carotid system: The left common, internal, and external carotid arteries are patent with mild plaque at the bifurcation but no hemodynamically significant stenosis or occlusion. There is no evidence of dissection or aneurysm   Vertebral arteries: The vertebral arteries  are patent, without hemodynamically significant stenosis or occlusion. There is no evidence of dissection or aneurysm.  Skeleton: There is no acute osseous abnormality or suspicious osseous lesion. There is no visible canal hematoma.   Other neck: The soft tissues of the neck are unremarkable.   Upper chest: There is an incompletely imaged right pleural effusion. The imaged lung apices are otherwise clear.   Review of the MIP images confirms the above findings   CTA HEAD FINDINGS   Anterior circulation: The intracranial ICAs are patent.   The right M1 segment is patent. Small patent MCA branches are seen in the region of the frontal operculum; however, there is essentially no other collateral flow in the remainder of the MCA distribution.   The left M1 segment and distal branches are patent, without proximal stenosis or occlusion.   The left A1 segment is relatively hypoplastic, likely developmental. The dominant right A1 segment is patent. The distal ACA branches are patent, without proximal stenosis or occlusion.   There is no aneurysm or AVM.   Posterior circulation: The bilateral V4 segments are patent. The basilar artery is patent, diminutive in caliber. The bilateral PCAs are patent, supplied by posterior communicating arteries bilaterally (fetal origins).   There is no aneurysm or AVM.   Venous sinuses: Not well evaluated due to bolus timing.   Anatomic variants: As above.   Review of the MIP images confirms the above findings   IMPRESSION: 1. Loss of gray-white differentiation in the insula likely reflecting early infarct. Aspects is 9. 2. Proximal right M2 occlusion with minimal collateral flow throughout the MCA distribution. 3. Patent vasculature of the neck with no hemodynamically significant stenosis or occlusion.   MRI brain and MRA head (09/25/22): FINDINGS: MRI HEAD FINDINGS   Brain: There is large area of diffusion restriction with  associated FLAIR signal abnormality in the right MCA distribution involving the external capsule, insular cortex, temporal lobe, frontal lobe, and parietal consistent with acute infarct. There is also a small area of infarct in the right superior frontal gyrus cortex near the vertex in the ACA distribution. Additional punctate infarcts are seen in the left cerebellar hemisphere, right caudate body/corona radiata, and right frontal lobe white matter. There is no evidence of hemorrhagic transformation. There is swelling with sulcal effacement but no midline shift.   There are punctate chronic microhemorrhages in the right caudate body and right external capsule, nonspecific. The focus in the caudate body is unchanged since 2023.   Background parenchymal volume is normal. The ventricles are normal in size. There is no significant underlying chronic small-vessel ischemic change, there is a small remote infarct but in the right cerebral peduncle, unchanged since 2023.   The pituitary and suprasellar region are normal. There is no solid mass lesion.   Vascular: See below.   Skull and upper cervical spine: Normal marrow signal.   Sinuses/Orbits: The paranasal sinuses are clear. The globes and orbits are unremarkable.   Other: None.   MRA HEAD FINDINGS   Anterior circulation: The intracranial ICAs are patent.   The right M1 segment is normal. There is persistent occlusion of a dominant right M2 branch in the sylvian fissure (9-111). Patent MCA branches are seen in the frontal operculum region; however, there is otherwise minimal collateral flow in the remainder of the MCA distribution.   The left M1 segment and distal branches are patent, without proximal stenosis or occlusion.   The bilateral ACAs are patent, without proximal stenosis or occlusion.   There is no aneurysm or AVM.   Posterior circulation: The bilateral V4 segments are patent.  The basilar artery is patent.  The major cerebellar arteries appear patent.   The bilateral PCAs are patent, primarily supplied by prominent posterior communicating arteries bilaterally (fetal origins). There is no proximal stenosis or occlusion   There is no aneurysm or AVM.   Anatomic variants: As above.   IMPRESSION: 1. Large area of acute infarct in the right MCA distribution, smaller area of acute infarct in the right ACA distribution, and punctate acute infarct in the left cerebellar hemisphere. No evidence of hemorrhagic transformation or midline shift. 2. Persistent occlusion of a right M2 branch with minimal collateral flow in the MCA distribution aside from in the region of the frontal operculum.   IR thrombectomy (09/24/22): FINDINGS: The right common carotid arteriogram demonstrates the right external carotid artery and its major branches to be widely patent.   The right internal carotid artery at the bulb to the cranial skull base is widely patent with moderate tortuosity in the mid cervical right ICA.   The petrous, the cavernous and the supraclinoid right ICA demonstrate wide patency. A right posterior communicating artery is seen opacifying the right posterior cerebral artery distribution.   The right anterior cerebral artery opacifies into the capillary and venous phases.   The right middle cerebral artery M1 segment is widely patent as is the superior division. Occlusion is noted of the mid M2 segment of the inferior division.   IMPRESSION: Status post endovascular revascularization of occluded inferior division M2 segment of the right middle cerebral artery with 1 pass with a contact aspiration, 1 pass with contact aspiration and 3 mm x 20 mm Solitaire X retrieval device, 1 pass with contact aspiration and 1 pass with a 4 mm x 40 mm Solitaire X retrieval device, 1 pass with the 3 mm x 20 mm Solitaire X retrieval device with contact aspiration and finally with 1 pass with contact  aspiration without significant change achieving a TICI 2B revascularization.   Echocardiogram (09/25/22): 1. 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   2. Left ventricular ejection fraction, by estimation, is 15%. The left  ventricle has severely decreased function. The left ventricle demonstrates  global hypokinesis. There is mild left ventricular hypertrophy. Left  ventricular diastolic parameters are  consistent with Grade III diastolic dysfunction (restrictive).   3. Right ventricular systolic function is moderately reduced. The right  ventricular size is normal.   4. The mitral valve is grossly normal. Mild mitral valve regurgitation.  No evidence of mitral stenosis.   5. Tricuspid valve regurgitation is moderate.   6. The aortic valve is tricuspid. Aortic valve regurgitation is not  visualized. No aortic stenosis is present.   7. The inferior vena cava is normal in size with <50% respiratory  variability, suggesting right atrial pressure of 8 mmHg, patient currently  intubated on mechanical ventilation.  LA normal in size.  CT head wo contrast (07/21/23): FINDINGS: Brain: Expected evolution of the prior right MCA and ACA territory infarcts with developing encephalomalacia in these regions. No evidence of acute large vascular territory infarct, acute hemorrhage, mass lesion or midline shift. No hydrocephalus.   Vascular: No hyperdense vessel identified.   Skull: No acute fracture.   Sinuses/Orbits: Clear sinuses.  No acute orbital findings.   Other: No mastoid effusions.   IMPRESSION: 1. Expected evolution of the prior right MCA and ACA territory infarcts. 2. No evidence of new/interval acute abnormality.  Assessment/Plan:  [name] is a 47 y.o. 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 She has a relevant medical history of HTN, HLD, DM, stroke, HFrEF, afib, CAD, OSA, migraines. Her neurological  examination is pertinent for spastic hemiplegia of left upper more than lower extremities, increased sensitivity/sensation in LUE and LLE compared to right, and neglect. Available diagnostic data is significant for MRI brain showing large right ACA and MCA infarct. Patient's recovery has been complicated by seizures, though I do not have those records as it happened in [place] . There has been no further concern for seizure since starting Keppra . The etiology of her stroke was likely the LV thrombus found during admission for decompensated heart failure.  PLAN: -Sleep medicine referral for untreated OSA -Continue Keppra  750 mg BID -Continue dabigatran  150 mg BID -Continue atorvastatin  80 mg daily -Continue PT and speech therapy -For migraines, will order Ubrelvy  for rescue. Will give samples today while awaiting PA. -May consider migraine ppx if headaches increase -May consider botox  for spasticity. Can continue flexeril  for now -Discussed stroke warning signs  -Return to clinic in 6 months  The impression above as well as the plan as outlined below were extensively discussed with the patient (in the company of father) who voiced understanding. All questions were answered to their satisfaction.  The patient was counseled on pertinent fall precautions per the printed material provided today, and as noted under the Patient Instructions section below.  When available, results of the above investigations and possible further recommendations will be communicated to the patient via telephone/MyChart. Patient to call office if not contacted after expected testing turnaround time.   Total time spent reviewing records, interview, history/exam, documentation, and coordination of care on day of encounter:  70 min   Thank you for allowing me to participate in patient's care.  If I can answer any additional questions, I would be pleased to do so.  Venetia Potters, MD   CC: Christia Budds, MD [address] [place] [address]  CC: Referring provider: Loistine Sober, NP [address] [place],  [place] [address]

## 2023-07-25 ENCOUNTER — Other Ambulatory Visit (HOSPITAL_COMMUNITY): Payer: Self-pay

## 2023-07-26 ENCOUNTER — Emergency Department (HOSPITAL_COMMUNITY): Payer: Medicare Other

## 2023-07-26 ENCOUNTER — Emergency Department (HOSPITAL_COMMUNITY)
Admission: EM | Admit: 2023-07-26 | Discharge: 2023-07-27 | Discharge status: Against Medical Advice | Payer: Medicare Other

## 2023-07-26 DIAGNOSIS — R079 Chest pain, unspecified: Secondary | ICD-10-CM | POA: Diagnosis present

## 2023-07-26 DIAGNOSIS — Z5321 Procedure and treatment not carried out due to patient leaving prior to being seen by health care provider: Secondary | ICD-10-CM | POA: Diagnosis not present

## 2023-07-26 DIAGNOSIS — R0789 Other chest pain: Secondary | ICD-10-CM | POA: Insufficient documentation

## 2023-07-26 LAB — CBC
HCT: 36.8 % (ref 36.0–46.0)
Hemoglobin: 11.9 g/dL — ABNORMAL LOW (ref 12.0–15.0)
MCH: 28 pg (ref 26.0–34.0)
MCHC: 32.3 g/dL (ref 30.0–36.0)
MCV: 86.6 fL (ref 80.0–100.0)
Platelets: 321 10*3/uL (ref 150–400)
RBC: 4.25 MIL/uL (ref 3.87–5.11)
RDW: 13.5 % (ref 11.5–15.5)
WBC: 8.2 10*3/uL (ref 4.0–10.5)
nRBC: 0 % (ref 0.0–0.2)

## 2023-07-26 LAB — BASIC METABOLIC PANEL
Anion gap: 9 (ref 5–15)
BUN: 14 mg/dL (ref 6–20)
CO2: 23 mmol/L (ref 22–32)
Calcium: 9.3 mg/dL (ref 8.9–10.3)
Chloride: 105 mmol/L (ref 98–111)
Creatinine, Ser: 0.81 mg/dL (ref 0.44–1.00)
GFR, Estimated: >60 mL/min (ref >60)
Glucose, Bld: 124 mg/dL — ABNORMAL HIGH (ref 70–99)
Potassium: 4.3 mmol/L (ref 3.5–5.1)
Sodium: 137 mmol/L (ref 135–145)

## 2023-07-26 LAB — TROPONIN I (HIGH SENSITIVITY): Troponin I (High Sensitivity): 8 ng/L (ref <18)

## 2023-07-26 NOTE — ED Provider Triage Note (Signed)
 Emergency Medicine Provider Triage Evaluation Note  [name] , a 48 y.o. female  was evaluated in triage.  Pt complains of left-sided chest pain, has been going on for the last 2 hours.  She states is constant and achy.  Worse with movement.  Also states she is very freaked out, after the fireworks last night.  She states she hears them. Denies SI/HI  Review of Systems  Positive: Chest pain Negative: sob  Physical Exam  BP (!) 174/111 (BP Location: Right Arm)   Pulse (!) 104   Temp 98.4 F (36.9 C) (Oral)   Resp 18   SpO2 100%  Gen:   Awake, no distress   Resp:  Normal effort  MSK:   Moves extremities without difficulty  Other:  +chest wall ttp  Medical Decision Making  Medically screening exam initiated at 8:34 PM.  Appropriate orders placed.  [name] was informed that the remainder of the evaluation will be completed by another provider, this initial triage assessment does not replace that evaluation, and the importance of remaining in the ED until their evaluation is complete.     [name], [name] 07/26/23 2107

## 2023-07-26 NOTE — ED Triage Notes (Signed)
 Pt BIB EMS from Home complains of chest pain, dizziness hasn't taken any daily medication today. Per EMS pt is also having visual hallucinations "seeing death"   EMS VS  170/100 HR 90 100% RA   CBG 146 18 20 RAC

## 2023-07-27 ENCOUNTER — Inpatient Hospital Stay: Payer: Medicare Other | Admitting: Student

## 2023-07-27 ENCOUNTER — Ambulatory Visit: Payer: Medicare Other | Admitting: Student

## 2023-07-27 ENCOUNTER — Other Ambulatory Visit (HOSPITAL_COMMUNITY): Payer: Self-pay

## 2023-07-27 VITALS — BP 158/98 | HR 82 | Ht 59.0 in | Wt 125.4 lb

## 2023-07-27 DIAGNOSIS — F419 Anxiety disorder, unspecified: Secondary | ICD-10-CM | POA: Diagnosis present

## 2023-07-27 DIAGNOSIS — R0789 Other chest pain: Secondary | ICD-10-CM

## 2023-07-27 DIAGNOSIS — G894 Chronic pain syndrome: Secondary | ICD-10-CM

## 2023-07-27 DIAGNOSIS — G8929 Other chronic pain: Secondary | ICD-10-CM

## 2023-07-27 DIAGNOSIS — M7989 Other specified soft tissue disorders: Secondary | ICD-10-CM | POA: Diagnosis not present

## 2023-07-27 DIAGNOSIS — F5101 Primary insomnia: Secondary | ICD-10-CM | POA: Diagnosis not present

## 2023-07-27 MED ORDER — HYDROXYZINE HCL 10 MG PO TABS
10.0000 mg | ORAL_TABLET | Freq: Three times a day (TID) | ORAL | 0 refills | Status: DC | PRN
  Filled 2023-07-27 – 2023-07-28 (×2): qty 90, 30d supply, fill #0

## 2023-07-27 MED ORDER — ESCITALOPRAM OXALATE 20 MG PO TABS
20.0000 mg | ORAL_TABLET | Freq: Every day | ORAL | 1 refills | Status: DC
  Filled 2023-07-27 – 2023-07-28 (×2): qty 30, 30d supply, fill #0
  Filled 2023-08-11: qty 30, 30d supply, fill #1

## 2023-07-27 MED ORDER — TRAZODONE HCL 150 MG PO TABS
150.0000 mg | ORAL_TABLET | Freq: Every evening | ORAL | 0 refills | Status: DC
  Filled 2023-07-27 – 2023-07-28 (×2): qty 30, 30d supply, fill #0

## 2023-07-27 NOTE — ED Notes (Signed)
 Pt stated she was going home and no longer wanted to wait. IV removed and pt left ED

## 2023-07-27 NOTE — Patient Instructions (Addendum)
 It was great to see you! Thank you for allowing me to participate in your care!   Our plans for today:  - I am increasing your lexapro  to 20 mg - I am changing your hydroxyzine  - Continue your medications as you have been taking them otherwise - I am placing another pain management referral - 1 month follow up  Take care and seek immediate care sooner if you develop any concerns.  Wendel Lesch, MD

## 2023-07-27 NOTE — Progress Notes (Signed)
 SUBJECTIVE:   CHIEF COMPLAINT / HPI: ED Follow Up  12/27-seen in [facility], ED for chest pain shortness of breath and dizziness and headache. CT head showed expected evolution of MCA and ACA infarcts without any new abnormalities. CTA showed no aortic dissection or aneurysm or PE.  Incidental mediastinal soft tissue density Negative troponin, EKG showed NSR Was given meclizine  for dizziness and morphine , Reglan , Benadryl , Decadron  and NS for headache/chest pain  1/1 went to ED again for chest pain-initial troponin negative at 8 and chest x-ray without any acute issues. EKG with sinus tachycardia without acute ischemic changes  Today:   Anxiety She expresses a need for medication to help her relax, as she is constantly anxious. This anxiety has led to visual disturbances, described as seeing circles. She denies seeing figures or people that aren't there. No known hx of bipolar disorder and has been on lexapro  for a while.   Insomnia The patient also reports insomnia. She has been prescribed trazodone  for sleep, but has run out and is awaiting a refill. She also mentions a change in her medication regimen, specifically a switch from Eliquis  to Pradaxa , which occurred during a recent hospital stay.  Chest wall pain Described as a pressure deep by her breasts that worsens with deep breaths and radiates to her back. This pain started several days ago. She also mentions a referral to a pain management team, but has not heard back from them.  PERTINENT  PMH / PSH: Atrial fibrillation, nonischemic cardiomyopathy, history of LV mural thrombus, HFrEF EF 15% with grade 3 diastolic dysfunction (restrictive), R MCA stroke , chronic chest wall pain OBJECTIVE:   BP (!) 158/98   Pulse 82   Ht 4' 11 (1.499 m)   Wt 125 lb 6.4 oz (56.9 kg)   SpO2 100%   BMI 25.33 kg/m   General: Well appearing, NAD, awake, alert, responsive to questions Head: Normocephalic atraumatic CV: Regular rate and  rhythm  Respiratory: Clear to ausculation bilaterally, chest rises symmetrically,  no increased work of breathing, anterior chest wall pain with palpation Extremities: No LE edema  ASSESSMENT/PLAN:   Assessment & Plan Anxiety Chronic anxiety seems to be worsening. No know mania but does have insomnia at baseline. Currently on escitalopram  10mg . -Increase escitalopram  to 20mg  daily. -Resume hydroxyzine  10 mg TID PRN as needed for anxiety -Return in 1 month to reassess Primary insomnia Chronic insomnia, previously managed with trazodone  but currently out of medication. -Refill trazodone   Chronic pain syndrome Previous referral to pain management team closed out, unclear if patient is currently receiving pain management although discussed physical medicine likely where her previous referral went. -Place new referral to pain management team Chronic chest wall pain Chronic chest wall pain, tender to palpation, radiating to back. No change in pain with deep breaths.  -Advised Voltaren  gel or lidocaine  patches  Soft tissue mass CTA on 12/27 with soft tissue density. Spoke with radiologist who recommended correlation clinically. 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  If there was a recent stressor this could be a thymic rebound.  Has had some recent bouts of pneumonia which could be playing into this as well as multiple chronic issues.  He would recommend a repeat CT chest in about 6 to 8 weeks to see if resolving.  If worsening or still there could consider an MR of this area. -Short interval f/u CT Chest WITH IV contrast in 6-8 weeks, consider MR only if  still there on next scan    Wendel Lesch, MD [facility]

## 2023-07-27 NOTE — Assessment & Plan Note (Signed)
 Chronic chest wall pain, tender to palpation, radiating to back. No change in pain with deep breaths.  -Advised Voltaren gel or lidocaine patches

## 2023-07-27 NOTE — Assessment & Plan Note (Signed)
 Previous referral to pain management team closed out, unclear if patient is currently receiving pain management although discussed physical medicine likely where her previous referral went. -Place new referral to pain management team

## 2023-07-27 NOTE — Progress Notes (Deleted)
    SUBJECTIVE:   CHIEF COMPLAINT / HPI: ED Follow Up  12/27-seen in [facility], ED for chest pain shortness of breath and dizziness and headache. CT head showed expected evolution of MCA and ACA infarcts without any new abnormalities. CTA showed no aortic dissection or aneurysm or PE.  Incidental mediastinal soft tissue density*** Negative troponin, EKG showed NSR Was given meclizine  for dizziness and morphine , Reglan , Benadryl , Decadron  and NS for headache/chest pain  1/1 went to ED again for chest pain-initial troponin negative at 8 and chest x-ray without any acute issues. EKG with sinus tachycardia without acute ischemic changes  Today    PERTINENT  PMH / PSH: Atrial fibrillation, nonischemic cardiomyopathy, history of LV mural thrombus, HFrEF EF 15% with grade 3 diastolic dysfunction (restrictive) OBJECTIVE:   There were no vitals taken for this visit.  ***  ASSESSMENT/PLAN:   No problem-specific Assessment & Plan notes found for this encounter.     Wendel Lesch, MD [facility]

## 2023-07-27 NOTE — Assessment & Plan Note (Signed)
 Chronic insomnia, previously managed with trazodone but currently out of medication. -Refill trazodone

## 2023-07-28 ENCOUNTER — Other Ambulatory Visit (HOSPITAL_COMMUNITY): Payer: Self-pay

## 2023-08-02 ENCOUNTER — Other Ambulatory Visit (HOSPITAL_COMMUNITY): Payer: Self-pay

## 2023-08-03 ENCOUNTER — Other Ambulatory Visit: Payer: Self-pay | Admitting: Physical Medicine and Rehabilitation

## 2023-08-03 ENCOUNTER — Ambulatory Visit (INDEPENDENT_AMBULATORY_CARE_PROVIDER_SITE_OTHER): Payer: Medicare Other | Admitting: Neurology

## 2023-08-03 ENCOUNTER — Other Ambulatory Visit (HOSPITAL_COMMUNITY): Payer: Self-pay

## 2023-08-03 ENCOUNTER — Telehealth: Payer: Self-pay | Admitting: Physical Medicine and Rehabilitation

## 2023-08-03 ENCOUNTER — Telehealth: Payer: Self-pay

## 2023-08-03 ENCOUNTER — Encounter: Payer: Self-pay | Admitting: Neurology

## 2023-08-03 VITALS — BP 148/81 | HR 84 | Ht 59.0 in | Wt 129.0 lb

## 2023-08-03 DIAGNOSIS — G4733 Obstructive sleep apnea (adult) (pediatric): Secondary | ICD-10-CM

## 2023-08-03 DIAGNOSIS — R569 Unspecified convulsions: Secondary | ICD-10-CM

## 2023-08-03 DIAGNOSIS — I63511 Cerebral infarction due to unspecified occlusion or stenosis of right middle cerebral artery: Secondary | ICD-10-CM | POA: Diagnosis not present

## 2023-08-03 DIAGNOSIS — G43009 Migraine without aura, not intractable, without status migrainosus: Secondary | ICD-10-CM

## 2023-08-03 MED ORDER — UBRELVY 100 MG PO TABS
100.0000 mg | ORAL_TABLET | Freq: Every day | ORAL | 3 refills | Status: AC | PRN
  Filled 2023-08-03: qty 30, 30d supply, fill #0
  Filled 2023-08-11: qty 10, 30d supply, fill #0

## 2023-08-03 MED ORDER — OXYCODONE HCL 5 MG PO TABS
5.0000 mg | ORAL_TABLET | Freq: Two times a day (BID) | ORAL | 0 refills | Status: DC | PRN
  Filled 2023-08-03 – 2023-08-21 (×4): qty 60, 30d supply, fill #0

## 2023-08-03 NOTE — Telephone Encounter (Signed)
 Medication Samples have been provided to the patient.  Drug name: ubrelvy        Strength: 100 mg        Qty: 4 boxes ( 1 tablet each)  LOT: [id number]  Exp.Date: 01/26  Dosing instructions: As needed for early on set of migraines.   The patient has been instructed regarding the correct time, dose, and frequency of taking this medication, including desired effects and most common side effects.   Charlies [name] Solo 3:16 PM 08/03/2023

## 2023-08-03 NOTE — Telephone Encounter (Signed)
 I left pt a  VM to call office to schedule appt with Jacalyn Lefevre.

## 2023-08-03 NOTE — Patient Instructions (Addendum)
 -Sleep medicine referral for untreated sleep apnea -Continue Keppra  750 mg twice daily -Continue dabigatran  150 mg twice daily -Continue atorvastatin  80 mg daily -Continue physical therapy and speech therapy -For migraines, will order Ubrelvy  for rescue. Take this at headache onset. I will need to have this medication approved, so I will give samples today while awaiting approval.  If you have new difficulty speaking, face droop, numbness on one side of the body, weakness on one side of the body, or dizziness/imbalance, this could be the sign of a stroke. Don't wait, please call EMS and be evaluated at the nearest emergency room.   The physicians and staff at [facility] are committed to providing excellent care. You may receive a survey requesting feedback about your experience at our office. We strive to receive very good responses to the survey questions. If you feel that your experience would prevent you from giving the office a very good  response, please contact our office to try to remedy the situation. We may be reached at [phone]. Thank you for taking the time out of your busy day to complete the survey.  Venetia Potters, MD [facility]  Preventing Falls at [facility] are common, often dreaded events in the lives of older people. Aside from the obvious injuries and even death that may result, fall can cause wide-ranging consequences including loss of independence, mental decline, decreased activity and mobility. Younger people are also at risk of falling, especially those with chronic illnesses and fatigue.  Ways to reduce risk for falling Examine diet and medications. Warm foods and alcohol dilate blood vessels, which can lead to dizziness when standing. Sleep aids, antidepressants and pain medications can also increase the likelihood of a fall.  Get a vision exam. Poor vision, cataracts and glaucoma increase the chances of falling.  Check foot gear. Shoes should  fit snugly and have a sturdy, nonskid sole and a broad, low heel  Participate in a physician-approved exercise program to build and maintain muscle strength and improve balance and coordination. Programs that use ankle weights or stretch bands are excellent for muscle-strengthening. Water aerobics programs and low-impact Tai Chi programs have also been shown to improve balance and coordination.  Increase vitamin D  intake. Vitamin D  improves muscle strength and increases the amount of calcium  the body is able to absorb and deposit in bones.  How to prevent falls from common hazards Floors - Remove all loose wires, cords, and throw rugs. Minimize clutter. Make sure rugs are anchored and smooth. Keep furniture in its usual place.  Chairs -- Use chairs with straight backs, armrests and firm seats. Add firm cushions to existing pieces to add height.  Bathroom - Install grab bars and non-skid tape in the tub or shower. Use a bathtub transfer bench or a shower chair with a back support Use an elevated toilet seat and/or safety rails to assist standing from a low surface. Do not use towel racks or bathroom tissue holders to help you stand.  Lighting - Make sure halls, stairways, and entrances are well-lit. Install a night light in your bathroom or hallway. Make sure there is a light switch at the top and bottom of the staircase. Turn lights on if you get up in the middle of the night. Make sure lamps or light switches are within reach of the bed if you have to get up during the night.  Kitchen - Install non-skid rubber mats near the sink and stove. Clean spills immediately. Store frequently  used utensils, pots, pans between waist and eye level. This helps prevent reaching and bending. Sit when getting things out of lower cupboards.  Living room/ Bedrooms - Place furniture with wide spaces in between, giving enough room to move around. Establish a route through the living room that gives you something to  hold onto as you walk.  Stairs - Make sure treads, rails, and rugs are secure. Install a rail on both sides of the stairs. If stairs are a threat, it might be helpful to arrange most of your activities on the lower level to reduce the number of times you must climb the stairs.  Entrances and doorways - Install metal handles on the walls adjacent to the doorknobs of all doors to make it more secure as you travel through the doorway.  Tips for maintaining balance Keep at least one hand free at all times. Try using a backpack or fanny pack to hold things rather than carrying them in your hands. Never carry objects in both hands when walking as this interferes with keeping your balance.  Attempt to swing both arms from front to back while walking. This might require a conscious effort if Parkinson's disease has diminished your movement. It will, however, help you to maintain balance and posture, and reduce fatigue.  Consciously lift your feet off of the ground when walking. Shuffling and dragging of the feet is a common culprit in losing your balance.  When trying to navigate turns, use a U technique of facing forward and making a wide turn, rather than pivoting sharply.  Try to stand with your feet shoulder-length apart. When your feet are close together for any length of time, you increase your risk of losing your balance and falling.  Do one thing at a time. Don't try to walk and accomplish another task, such as reading or looking around. The decrease in your automatic reflexes complicates motor function, so the less distraction, the better.  Do not wear rubber or gripping soled shoes, they might catch on the floor and cause tripping.  Move slowly when changing positions. Use deliberate, concentrated movements and, if needed, use a grab bar or walking aid. Count 15 seconds between each movement. For example, when rising from a seated position, wait 15 seconds after standing to begin  walking.  If balance is a continuous problem, you might want to consider a walking aid such as a cane, walking stick, or walker. Once you've mastered walking with help, you might be ready to try it on your own again.

## 2023-08-03 NOTE — Telephone Encounter (Signed)
 Patient is calling asking for medication. She stated Dr Carlis Abbott stated she would send in a different medication after screening. She has not heard from anyone. Please call patient.

## 2023-08-03 NOTE — Telephone Encounter (Signed)
 Called and informed that we would need her insurance card in order to get the [place] approved. She said she would.

## 2023-08-08 ENCOUNTER — Other Ambulatory Visit: Payer: Self-pay

## 2023-08-11 ENCOUNTER — Other Ambulatory Visit (HOSPITAL_COMMUNITY): Payer: Self-pay

## 2023-08-11 ENCOUNTER — Telehealth: Payer: Self-pay | Admitting: Pharmacy Technician

## 2023-08-11 NOTE — Telephone Encounter (Signed)
Pharmacy Patient Advocate Encounter  Received notification from [facility] that Prior Authorization for Ubrelvy 100MG  tablets has been APPROVED from 08/11/2023 to 07/24/2024   PA #/Case ID/Reference #: [id number] Key: Camie Patience

## 2023-08-11 NOTE — Telephone Encounter (Signed)
PA request has been Approved. New Encounter created for follow up. For additional info see Pharmacy Prior Auth telephone encounter from 08/11/2023.

## 2023-08-11 NOTE — Telephone Encounter (Signed)
Called pt and let her know about approval of Ubrelvy. She reported that her father was on the way to pick up the meds

## 2023-08-16 ENCOUNTER — Telehealth: Payer: Self-pay

## 2023-08-16 NOTE — Telephone Encounter (Signed)
 Approval given

## 2023-08-16 NOTE — Telephone Encounter (Signed)
[name] [phone]    [name] called asking for Home health verbal  orders for PT for   1wk4

## 2023-08-19 ENCOUNTER — Other Ambulatory Visit: Payer: Self-pay

## 2023-08-19 ENCOUNTER — Emergency Department (HOSPITAL_COMMUNITY): Payer: Medicare Other

## 2023-08-19 ENCOUNTER — Emergency Department (HOSPITAL_COMMUNITY)
Admission: EM | Admit: 2023-08-19 | Discharge: 2023-08-20 | Disposition: A | Discharge status: Home / Self Care | Payer: Medicare Other | Attending: Emergency Medicine | Admitting: Emergency Medicine

## 2023-08-19 ENCOUNTER — Encounter (HOSPITAL_COMMUNITY): Payer: Self-pay

## 2023-08-19 DIAGNOSIS — R42 Dizziness and giddiness: Secondary | ICD-10-CM | POA: Insufficient documentation

## 2023-08-19 DIAGNOSIS — Z993 Dependence on wheelchair: Secondary | ICD-10-CM | POA: Diagnosis not present

## 2023-08-19 DIAGNOSIS — R079 Chest pain, unspecified: Secondary | ICD-10-CM | POA: Insufficient documentation

## 2023-08-19 DIAGNOSIS — Z7901 Long term (current) use of anticoagulants: Secondary | ICD-10-CM | POA: Insufficient documentation

## 2023-08-19 DIAGNOSIS — Z20822 Contact with and (suspected) exposure to covid-19: Secondary | ICD-10-CM | POA: Insufficient documentation

## 2023-08-19 DIAGNOSIS — I4891 Unspecified atrial fibrillation: Secondary | ICD-10-CM | POA: Insufficient documentation

## 2023-08-19 DIAGNOSIS — I69352 Hemiplegia and hemiparesis following cerebral infarction affecting left dominant side: Secondary | ICD-10-CM | POA: Insufficient documentation

## 2023-08-19 DIAGNOSIS — I428 Other cardiomyopathies: Secondary | ICD-10-CM | POA: Diagnosis not present

## 2023-08-19 LAB — CBC
HCT: 37.8 % (ref 36.0–46.0)
Hemoglobin: 11.9 g/dL — ABNORMAL LOW (ref 12.0–15.0)
MCH: 27.4 pg (ref 26.0–34.0)
MCHC: 31.5 g/dL (ref 30.0–36.0)
MCV: 86.9 fL (ref 80.0–100.0)
Platelets: 263 10*3/uL (ref 150–400)
RBC: 4.35 MIL/uL (ref 3.87–5.11)
RDW: 12.9 % (ref 11.5–15.5)
WBC: 6.5 10*3/uL (ref 4.0–10.5)
nRBC: 0 % (ref 0.0–0.2)

## 2023-08-19 LAB — BASIC METABOLIC PANEL
Anion gap: 7 (ref 5–15)
BUN: 15 mg/dL (ref 6–20)
CO2: 25 mmol/L (ref 22–32)
Calcium: 9.5 mg/dL (ref 8.9–10.3)
Chloride: 104 mmol/L (ref 98–111)
Creatinine, Ser: 0.63 mg/dL (ref 0.44–1.00)
GFR, Estimated: >60 mL/min (ref >60)
Glucose, Bld: 170 mg/dL — ABNORMAL HIGH (ref 70–99)
Potassium: 3.5 mmol/L (ref 3.5–5.1)
Sodium: 136 mmol/L (ref 135–145)

## 2023-08-19 LAB — TROPONIN I (HIGH SENSITIVITY)
Troponin I (High Sensitivity): 12 ng/L (ref <18)
Troponin I (High Sensitivity): 9 ng/L (ref <18)

## 2023-08-19 LAB — HCG, SERUM, QUALITATIVE: Preg, Serum: NEGATIVE

## 2023-08-19 NOTE — ED Provider Notes (Signed)
[place] EMERGENCY DEPARTMENT AT [facility] Provider Note   CSN: [id number] Arrival date & time: 08/19/23  1811     History  Chief Complaint  Patient presents with   Chest Pain    [name] is a 48 y.o. 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  Residual left-sided deficits with some cognitive deficits.  Presents today reporting pain on the left side in the chest, she states has been persistent since her stroke last year.  Upon further discussion she reports she also had an episode of lightheadedness for which her daughter sent her to the emergency department due to fear of having her at home where she is her primary caretaker.  In addition to the above listed history is history of nonischemic cardiomyopathy.  She is on Pradaxa.  She states that her pain on her left side is unchanged from baseline and she does follow with neurology.  No longer lightheaded, no chest pain shortness of breath palpitations at this time.  HPI     Home Medications Prior to Admission medications   Medication Sig Start Date End Date Taking? Authorizing Provider  Accu-Chek Softclix Lancets lancets USE 1  TO CHECK GLUCOSE 4 TIMES DAILY 01/12/23   Carney Living, MD  Accu-Chek Softclix Lancets lancets Use each in the morning, at noon, and at bedtime. 07/04/23   Raulkar, Drema Pry, MD  acetaminophen (TYLENOL) 500 MG tablet Take 500-1,000 mg by mouth every 6 (six) hours as needed for mild pain or headache (OR CRAMPING).    [provider]  albuterol (VENTOLIN HFA) 108 (90 Base) MCG/ACT inhaler Inhale 2 puffs into the lungs every 6 (six) hours as needed for wheezing or shortness of breath. 01/23/23   Levin Erp, MD  atorvastatin (LIPITOR) 80 MG tablet Take 1 tablet (80 mg total) by mouth daily after supper. 04/17/23   McDiarmid, Leighton Roach, MD  bisacodyl 5 MG EC tablet Take  2 tablets (10 mg total) by mouth every 24 hours as needed for constipation. 06/02/23     blood glucose meter kit and supplies Use up to 4 (four) times daily as directed 07/04/23   Raulkar, Drema Pry, MD  Blood Glucose Monitoring Suppl (ACCU-CHEK GUIDE) w/Device KIT Use up to 4 (four) times daily. 07/04/23   Raulkar, Drema Pry, MD  Cholecalciferol (VITAMIN D) 125 MCG (5000 UT) CAPS Take one capsule by mouth once daily. 04/17/23   McDiarmid, Leighton Roach, MD  cyclobenzaprine (FLEXERIL) 5 MG tablet Take 1 tablet (5 mg total) by mouth 3 (three) times daily as needed for muscle spasms. 07/04/23   Raulkar, Drema Pry, MD  dabigatran (PRADAXA) 150 MG CAPS capsule Take 1 capsule (150 mg total) by mouth 2 (two) times daily for anticoagulant. 06/02/23     dapagliflozin propanediol (FARXIGA) 10 MG TABS tablet Take 1 tablet (10 mg total) by mouth daily for diabetes. 06/02/23     empagliflozin (JARDIANCE) 25 MG TABS tablet Take 1 tablet (25 mg total) by mouth daily. 04/17/23   McDiarmid, Leighton Roach, MD  escitalopram (LEXAPRO) 20 MG tablet Take 1 tablet (20 mg total) by mouth at bedtime. 07/27/23   Levin Erp, MD  glipiZIDE (GLUCOTROL) 5 MG tablet Take 1 tablet (5 mg total) by mouth daily before breakfast. 04/17/23   McDiarmid, Leighton Roach, MD  glucose blood (ACCU-CHEK GUIDE) test strip USE  4 TIMES DAILY 01/12/23  Ganta, Anupa, DO  hydrOXYzine (ATARAX) 10 MG tablet Take 1 tablet (10 mg total) by mouth every 8 (eight) hours as needed for itching. 06/02/23     hydrOXYzine (ATARAX) 10 MG tablet Take 1 tablet (10 mg total) by mouth 3 (three) times daily as needed for anxiety. 07/27/23   Levin Erp, MD  levETIRAcetam (KEPPRA) 750 MG tablet Take 1 tablet (750 mg total) by mouth 2 (two) times daily for seizures. 06/02/23     losartan (COZAAR) 50 MG tablet Take 1 tablet (50 mg total) by mouth daily. 04/17/23   McDiarmid, Leighton Roach, MD  megestrol (MEGACE) 400 MG/10ML suspension Take 10 mLs (400 mg total) by mouth 2 (two) times daily. Patient  not taking: Reported on 08/03/2023 12/07/22   Ganta, Anupa, DO  melatonin 5 MG TABS Take 1 tablet (5 mg total) by mouth at bedtime. Patient not taking: Reported on 08/03/2023 04/17/23   McDiarmid, Leighton Roach, MD  metoprolol tartrate (LOPRESSOR) 25 MG tablet Take 1 tablet (25 mg total) by mouth 2 (two) times daily. 04/17/23   McDiarmid, Leighton Roach, MD  oxyCODONE (ROXICODONE) 5 MG immediate release tablet Take 1 tablet (5 mg total) by mouth 2 (two) times daily as needed for severe pain (pain score 7-10). 08/03/23   Raulkar, Drema Pry, MD  spironolactone (ALDACTONE) 25 MG tablet Take 1 tablet (25 mg total) by mouth daily. 04/17/23   McDiarmid, Leighton Roach, MD  torsemide (DEMADEX) 20 MG tablet Take 1 tablet (20 mg total) by mouth daily. 01/20/23   Levin Erp, MD  traZODone (DESYREL) 150 MG tablet Take 1 tablet (150 mg total) by mouth at bedtime for insomnia. 07/27/23   Levin Erp, MD  Ubrogepant (UBRELVY) 100 MG TABS Take 1 tablet (100 mg total) by mouth daily as needed. 08/03/23   Antony Madura, MD  carvedilol (COREG) 3.125 MG tablet Take 1 tablet (3.125 mg total) by mouth 2 (two) times daily with a meal. 02/27/22 07/01/22  Meredeth Ide, MD      Allergies    Fish allergy, Shellfish allergy, Metformin, Metformin and related, Nsaids, and Trulicity [dulaglutide]    Review of Systems   Review of Systems  Constitutional: Negative.   HENT: Negative.    Respiratory: Negative.    Cardiovascular: Negative.   Neurological:  Positive for light-headedness.    Physical Exam Updated Vital Signs BP (!) 174/96   Pulse 72   Temp 97.7 F (36.5 C) (Oral)   Resp 11   Ht 4\' 11"  (1.499 m)   Wt 58.5 kg   SpO2 97%   BMI 26.05 kg/m  Physical Exam Vitals and nursing note reviewed.  Constitutional:      Appearance: She is not ill-appearing or toxic-appearing.  HENT:     Head: Normocephalic and atraumatic.     Mouth/Throat:     Mouth: Mucous membranes are moist.     Pharynx: No oropharyngeal exudate or posterior  oropharyngeal erythema.  Eyes:     General:        Right eye: No discharge.        Left eye: No discharge.     Conjunctiva/sclera: Conjunctivae normal.  Cardiovascular:     Rate and Rhythm: Normal rate and regular rhythm.     Pulses: Normal pulses.     Heart sounds: Normal heart sounds. No murmur heard. Pulmonary:     Effort: Pulmonary effort is normal. No respiratory distress.     Breath sounds: Normal breath sounds. No wheezing or rales.  Abdominal:     General: Bowel sounds are normal. There is no distension.     Tenderness: There is no abdominal tenderness.  Musculoskeletal:        General: No deformity.     Cervical back: Neck supple.  Skin:    General: Skin is warm and dry.  Neurological:     General: No focal deficit present.     Mental Status: She is alert. Mental status is at baseline.     GCS: GCS eye subscore is 4. GCS verbal subscore is 4. GCS motor subscore is 6.     Cranial Nerves: Cranial nerves 2-12 are intact.     Sensory: Sensation is intact.     Motor: Weakness and atrophy present.     Coordination: Coordination abnormal.     Comments: Patient is wheelchair-bound at baseline following stroke last year.  Spasticity of the left upper extremity following stroke, no impaired coordination with that extremity as well.  Psychiatric:        Mood and Affect: Mood normal.     ED Results / Procedures / Treatments   Labs (all labs ordered are listed, but only abnormal results are displayed) Labs Reviewed  BASIC METABOLIC PANEL - Abnormal; Notable for the following components:      Result Value   Glucose, Bld 170 (*)    All other components within normal limits  CBC - Abnormal; Notable for the following components:   Hemoglobin 11.9 (*)    All other components within normal limits  RESP PANEL BY RT-PCR (RSV, FLU A&B, COVID)  RVPGX2  HCG, SERUM, QUALITATIVE  TROPONIN I (HIGH SENSITIVITY)  TROPONIN I (HIGH SENSITIVITY)    EKG None  Radiology CT HEAD WO  CONTRAST ( ) Result Date: 08/20/2023 CLINICAL DATA:  SYNCOPE/PRESYNCOPE. EXAM: CT HEAD WITHOUT CONTRAST TECHNIQUE: Contiguous axial images were obtained from the base of the skull through the vertex without intravenous contrast. RADIATION DOSE REDUCTION: This exam was performed according to the departmental dose-optimization program which includes automated exposure control, adjustment of the mA and/or kV according to patient size and/or use of iterative reconstruction technique. COMPARISON:  HEAD CT 07/21/2023. FINDINGS: Brain: Sequelae from old right MCA and ACA territory infarcts are again noted, unchanged from prior. No mass effect or midline shift. No acute infarcts are seen. No acute intracranial hemorrhage or hydrocephalus. No extra-axial fluid collection. Vascular: No hyperdense vessel or unexpected calcification. Skull: Normal. Negative for fracture or focal lesion. Sinuses/Orbits: No acute finding. Other: None. IMPRESSION: 1. No acute intracranial process. 2. Sequelae from old right MCA and ACA territory infarcts, unchanged from prior. Electronically Signed   By: Darliss Cheney M.D.   On: 08/20/2023 02:19   DG Chest 2 View Result Date: 08/19/2023 CLINICAL DATA:  Chest pressure.  Left-sided pain. EXAM: CHEST - 2 VIEW COMPARISON:  07/26/2023, chest CT 07/21/2023 FINDINGS: Stable cardiomegaly.The cardiomediastinal contours are normal. The lungs are clear. Pulmonary vasculature is normal. No consolidation, pleural effusion, or pneumothorax. No acute osseous abnormalities are seen. IMPRESSION: Unchanged cardiomegaly.  No acute findings. Electronically Signed   By: Narda Rutherford M.D.   On: 08/19/2023 19:21    Procedures Procedures    Medications Ordered in ED Medications  HYDROcodone-acetaminophen (NORCO/VICODIN) 5-325 MG per tablet 2 tablet (2 tablets Oral Given 08/20/23 0032)    ED Course/ Medical Decision Making/ A&P  Medical Decision Making 48 year old  female presents with lightheadedness in context of stroke last year, wheelchair-bound, residual deficits.  On intake vital signs otherwise normal.  Cardiopulmonary Unremarkable, abdominal exam is benign.  Patient at her neurologic baseline without acute focal deficit.  Amount and/or Complexity of Data Reviewed Labs: ordered.    Details: CBC without leukocytosis, mild anemia with hemoglobin 11.9, otherwise unremarkable, BMP unremarkable, pregnancy test negative, Trope negative, RVP negative.  Radiology: ordered.    Details:  Chest x-ray negative for acute cardiopulmonary disease.  CT head negative for acute cranial abnormality. ECG/medicine tests:     Details:   EKG with some PVCs but nonischemic.    Risk Prescription drug management.    Clinical picture reassuring; unclear etiology of patient's chronic pain following CVA in the past; recommend close outpatient follow-up with neurology.  Clinical concern for emergent underlying condition that would warrant further ED workup or inpatient management is exceedingly low.  Yalanda voiced understanding of her medical evaluation and treatment plan. Each of their questions answered to their expressed satisfaction.  Return precautions were given.  Patient is well-appearing, stable, and was discharged in good condition.  This chart was dictated using voice recognition software, Dragon. Despite the best efforts of this provider to proofread and correct errors, errors may still occur which can change documentation meaning.        Final Clinical Impression(s) / ED Diagnoses Final diagnoses:  Chest pain, unspecified type    Rx / DC Orders ED Discharge Orders          Ordered    Ambulatory referral to Cardiology       Comments: If you have not heard from the Cardiology office within the next 72 hours please call [phone].   08/20/23 0307              Vernis Cabacungan, Eugene Gavia, PA-C 08/20/23 0981    Alvira Monday,  MD 08/21/23 1213

## 2023-08-19 NOTE — ED Provider Notes (Incomplete)
[name] EMERGENCY DEPARTMENT AT [facility] Provider Note   CSN: [id number] Arrival date & time: 08/19/23  1811     History {Add pertinent medical, surgical, social history, OB history to HPI:1} Chief Complaint  Patient presents with  . Chest Pain    [name] [name] is a 48 y.o. female.  HPI     Home Medications Prior to Admission medications   Medication Sig Start Date End Date Taking? Authorizing Provider  Accu-Chek Softclix Lancets lancets USE 1  TO CHECK GLUCOSE 4 TIMES DAILY 01/12/23   Carney Living, MD  Accu-Chek Softclix Lancets lancets Use each in the morning, at noon, and at bedtime. 07/04/23   Raulkar, Drema Pry, MD  acetaminophen (TYLENOL) 500 MG tablet Take 500-1,000 mg by mouth every 6 (six) hours as needed for mild pain or headache (OR CRAMPING).    [provider]  albuterol (VENTOLIN HFA) 108 (90 Base) MCG/ACT inhaler Inhale 2 puffs into the lungs every 6 (six) hours as needed for wheezing or shortness of breath. 01/23/23   Levin Erp, MD  atorvastatin (LIPITOR) 80 MG tablet Take 1 tablet (80 mg total) by mouth daily after supper. 04/17/23   McDiarmid, Leighton Roach, MD  bisacodyl 5 MG EC tablet Take 2 tablets (10 mg total) by mouth every 24 hours as needed for constipation. 06/02/23     blood glucose meter kit and supplies Use up to 4 (four) times daily as directed 07/04/23   Raulkar, Drema Pry, MD  Blood Glucose Monitoring Suppl (ACCU-CHEK GUIDE) w/Device KIT Use up to 4 (four) times daily. 07/04/23   Raulkar, Drema Pry, MD  Cholecalciferol (VITAMIN D) 125 MCG (5000 UT) CAPS Take one capsule by mouth once daily. 04/17/23   McDiarmid, Leighton Roach, MD  cyclobenzaprine (FLEXERIL) 5 MG tablet Take 1 tablet (5 mg total) by mouth 3 (three) times daily as needed for muscle spasms. 07/04/23   Raulkar, Drema Pry, MD  dabigatran (PRADAXA) 150 MG CAPS capsule Take 1 capsule (150 mg total) by mouth 2 (two) times daily for anticoagulant. 06/02/23      dapagliflozin propanediol (FARXIGA) 10 MG TABS tablet Take 1 tablet (10 mg total) by mouth daily for diabetes. 06/02/23     empagliflozin (JARDIANCE) 25 MG TABS tablet Take 1 tablet (25 mg total) by mouth daily. 04/17/23   McDiarmid, Leighton Roach, MD  escitalopram (LEXAPRO) 20 MG tablet Take 1 tablet (20 mg total) by mouth at bedtime. 07/27/23   Levin Erp, MD  glipiZIDE (GLUCOTROL) 5 MG tablet Take 1 tablet (5 mg total) by mouth daily before breakfast. 04/17/23   McDiarmid, Leighton Roach, MD  glucose blood (ACCU-CHEK GUIDE) test strip USE  4 TIMES DAILY 01/12/23   Ganta, Anupa, DO  hydrOXYzine (ATARAX) 10 MG tablet Take 1 tablet (10 mg total) by mouth every 8 (eight) hours as needed for itching. 06/02/23     hydrOXYzine (ATARAX) 10 MG tablet Take 1 tablet (10 mg total) by mouth 3 (three) times daily as needed for anxiety. 07/27/23   Levin Erp, MD  levETIRAcetam (KEPPRA) 750 MG tablet Take 1 tablet (750 mg total) by mouth 2 (two) times daily for seizures. 06/02/23     losartan (COZAAR) 50 MG tablet Take 1 tablet (50 mg total) by mouth daily. 04/17/23   McDiarmid, Leighton Roach, MD  megestrol (MEGACE) 400 MG/10ML suspension Take 10 mLs (400 mg total) by mouth 2 (two) times daily. Patient not taking: Reported on 08/03/2023 12/07/22   Reece Leader, DO  melatonin 5 MG TABS Take 1 tablet (5 mg total) by mouth at bedtime. Patient not taking: Reported on 08/03/2023 04/17/23   McDiarmid, Leighton Roach, MD  metoprolol tartrate (LOPRESSOR) 25 MG tablet Take 1 tablet (25 mg total) by mouth 2 (two) times daily. 04/17/23   McDiarmid, Leighton Roach, MD  oxyCODONE (ROXICODONE) 5 MG immediate release tablet Take 1 tablet (5 mg total) by mouth 2 (two) times daily as needed for severe pain (pain score 7-10). 08/03/23   Raulkar, Drema Pry, MD  spironolactone (ALDACTONE) 25 MG tablet Take 1 tablet (25 mg total) by mouth daily. 04/17/23   McDiarmid, Leighton Roach, MD  torsemide (DEMADEX) 20 MG tablet Take 1 tablet (20 mg total) by mouth daily. 01/20/23   Levin Erp, MD  traZODone (DESYREL) 150 MG tablet Take 1 tablet (150 mg total) by mouth at bedtime for insomnia. 07/27/23   Levin Erp, MD  Ubrogepant (UBRELVY) 100 MG TABS Take 1 tablet (100 mg total) by mouth daily as needed. 08/03/23   Antony Madura, MD  carvedilol (COREG) 3.125 MG tablet Take 1 tablet (3.125 mg total) by mouth 2 (two) times daily with a meal. 02/27/22 07/01/22  Meredeth Ide, MD      Allergies    Fish allergy, Shellfish allergy, Metformin, Metformin and related, Nsaids, and Trulicity [dulaglutide]    Review of Systems   Review of Systems  Physical Exam Updated Vital Signs BP (!) 168/97 (BP Location: Right Arm)   Pulse 83   Temp 98.3 F (36.8 C)   Resp 18   Ht 4\' 11"  (1.499 m)   Wt 58.5 kg   SpO2 100%   BMI 26.05 kg/m  Physical Exam  ED Results / Procedures / Treatments   Labs (all labs ordered are listed, but only abnormal results are displayed) Labs Reviewed  BASIC METABOLIC PANEL - Abnormal; Notable for the following components:      Result Value   Glucose, Bld 170 (*)    All other components within normal limits  CBC - Abnormal; Notable for the following components:   Hemoglobin 11.9 (*)    All other components within normal limits  HCG, SERUM, QUALITATIVE  TROPONIN I (HIGH SENSITIVITY)  TROPONIN I (HIGH SENSITIVITY)    EKG None  Radiology DG Chest 2 View Result Date: 08/19/2023 CLINICAL DATA:  Chest pressure.  Left-sided pain. EXAM: CHEST - 2 VIEW COMPARISON:  07/26/2023, chest CT 07/21/2023 FINDINGS: Stable cardiomegaly.The cardiomediastinal contours are normal. The lungs are clear. Pulmonary vasculature is normal. No consolidation, pleural effusion, or pneumothorax. No acute osseous abnormalities are seen. IMPRESSION: Unchanged cardiomegaly.  No acute findings. Electronically Signed   By: Narda Rutherford M.D.   On: 08/19/2023 19:21    Procedures Procedures  {Document cardiac monitor, telemetry assessment procedure when  appropriate:1}  Medications Ordered in ED Medications - No data to display  ED Course/ Medical Decision Making/ A&P   {   Click here for ABCD2, HEART and other calculatorsREFRESH Note before signing :1}                              Medical Decision Making Amount and/or Complexity of Data Reviewed Labs: ordered. Radiology: ordered.   ***  {Document critical care time when appropriate:1} {Document review of labs and clinical decision tools ie heart score, Chads2Vasc2 etc:1}  {Document your independent review of radiology images, and any outside records:1} {Document your discussion with family members, caretakers,  and with consultants:1} {Document social determinants of health affecting pt's care:1} {Document your decision making why or why not admission, treatments were needed:1} Final Clinical Impression(s) / ED Diagnoses Final diagnoses:  None    Rx / DC Orders ED Discharge Orders     None

## 2023-08-19 NOTE — ED Triage Notes (Signed)
Pt BIB EMS from home with reports of chest pressure and left sided body pain since 10 am.

## 2023-08-20 ENCOUNTER — Emergency Department (HOSPITAL_COMMUNITY): Payer: Medicare Other

## 2023-08-20 DIAGNOSIS — R079 Chest pain, unspecified: Secondary | ICD-10-CM | POA: Diagnosis not present

## 2023-08-20 LAB — RESP PANEL BY RT-PCR (RSV, FLU A&B, COVID)  RVPGX2
Influenza A by PCR: NEGATIVE
Influenza B by PCR: NEGATIVE
Resp Syncytial Virus by PCR: NEGATIVE
SARS Coronavirus 2 by RT PCR: NEGATIVE

## 2023-08-20 MED ORDER — HYDROCODONE-ACETAMINOPHEN 5-325 MG PO TABS
2.0000 | ORAL_TABLET | Freq: Once | ORAL | Status: AC
  Administered 2023-08-20: 2 via ORAL
  Filled 2023-08-20: qty 2

## 2023-08-20 NOTE — Discharge Instructions (Signed)
You are seen in the ER today for your pain, your workup was reassuring.  You may follow-up with a cardiologist to whom you have been referred and seen with your primary care doctor.  Return to the ER with any severe symptoms.

## 2023-08-20 NOTE — ED Notes (Signed)
PTAR contacted(Vonnie [phone])

## 2023-08-21 ENCOUNTER — Other Ambulatory Visit: Payer: Self-pay

## 2023-08-21 ENCOUNTER — Other Ambulatory Visit: Payer: Self-pay | Admitting: Student

## 2023-08-21 ENCOUNTER — Other Ambulatory Visit (HOSPITAL_COMMUNITY): Payer: Self-pay

## 2023-08-21 DIAGNOSIS — F5101 Primary insomnia: Secondary | ICD-10-CM

## 2023-08-21 MED ORDER — TRAZODONE HCL 150 MG PO TABS
150.0000 mg | ORAL_TABLET | Freq: Every evening | ORAL | 0 refills | Status: DC
  Filled 2023-08-21 (×2): qty 30, 30d supply, fill #0

## 2023-08-22 ENCOUNTER — Other Ambulatory Visit (HOSPITAL_COMMUNITY): Payer: Self-pay

## 2023-08-28 ENCOUNTER — Ambulatory Visit (INDEPENDENT_AMBULATORY_CARE_PROVIDER_SITE_OTHER): Payer: Medicare Other | Admitting: Student

## 2023-08-28 ENCOUNTER — Other Ambulatory Visit (HOSPITAL_COMMUNITY): Payer: Self-pay

## 2023-08-28 ENCOUNTER — Encounter: Payer: Self-pay | Admitting: Student

## 2023-08-28 VITALS — BP 111/88 | HR 113 | Ht 59.0 in | Wt 126.0 lb

## 2023-08-28 DIAGNOSIS — E1165 Type 2 diabetes mellitus with hyperglycemia: Secondary | ICD-10-CM | POA: Diagnosis present

## 2023-08-28 DIAGNOSIS — M7989 Other specified soft tissue disorders: Secondary | ICD-10-CM | POA: Diagnosis not present

## 2023-08-28 DIAGNOSIS — J9859 Other diseases of mediastinum, not elsewhere classified: Secondary | ICD-10-CM | POA: Diagnosis not present

## 2023-08-28 DIAGNOSIS — I63411 Cerebral infarction due to embolism of right middle cerebral artery: Secondary | ICD-10-CM | POA: Diagnosis not present

## 2023-08-28 DIAGNOSIS — F419 Anxiety disorder, unspecified: Secondary | ICD-10-CM

## 2023-08-28 DIAGNOSIS — Z1159 Encounter for screening for other viral diseases: Secondary | ICD-10-CM | POA: Diagnosis not present

## 2023-08-28 DIAGNOSIS — Z7984 Long term (current) use of oral hypoglycemic drugs: Secondary | ICD-10-CM | POA: Diagnosis not present

## 2023-08-28 LAB — POCT GLYCOSYLATED HEMOGLOBIN (HGB A1C): HbA1c, POC (controlled diabetic range): 5.7 % (ref 0.0–7.0)

## 2023-08-28 MED ORDER — CYCLOBENZAPRINE HCL 5 MG PO TABS
5.0000 mg | ORAL_TABLET | Freq: Three times a day (TID) | ORAL | 0 refills | Status: DC | PRN
  Filled 2023-08-28: qty 90, 30d supply, fill #0

## 2023-08-28 MED ORDER — ESCITALOPRAM OXALATE 20 MG PO TABS
20.0000 mg | ORAL_TABLET | Freq: Every day | ORAL | 1 refills | Status: DC
  Filled 2023-08-28 – 2023-12-04 (×2): qty 90, 90d supply, fill #0

## 2023-08-28 NOTE — Progress Notes (Cosign Needed)
    SUBJECTIVE:   CHIEF COMPLAINT / HPI: Follow up  Anxiety Lexapro increased to 20 mg at last visit. Continued hydroxyzine TID PRN at last visit. 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 Overall feeling well today.  Hypertension Home meds for HF and HTN of losartan 50 mg daily, Lopressor 25 mg twice daily, spironolactone 25 mg daily, torsemide 20 mg daily, Jardiance 25 mg daily  Diabetes Home medications include: On glipizide 5 mg daily, Jardiance 25 mg daily ACEi/ARB: yes Statin: yes Patient endorses taking these medications as prescribed. Denies any lows  Most recent A1Cs:  Lab Results  Component Value Date   HGBA1C 5.7 08/28/2023   HGBA1C 7.8 (A) 12/29/2022   HGBA1C 10.8 (H) 09/24/2022   Last Microalbumin, LDL, Creatinine: Lab Results  Component Value Date   MICROALBUR 150 03/13/2019   LDLCALC 97 09/24/2022   CREATININE 0.63 08/19/2023   Patient does check blood glucose on a regular basis. No lows.  Patient is not up to date on diabetic eye. - Referred  Chronic Pain Followed by PM&R.  Currently on oxycodone as needed.  She states that she is out of her Flexeril.  She has appointment with them on 2/6.  PERTINENT  PMH / PSH: Atrial fibrillation, nonischemic cardiomyopathy, history of LV mural thrombus, HFrEF EF 15% with grade 3 diastolic dysfunction (restrictive), R MCA stroke , chronic chest wall pain   OBJECTIVE:   BP 111/88   Pulse (!) 113   Ht 4\' 11"  (1.499 m)   Wt 126 lb (57.2 kg)   SpO2 100%   BMI 25.45 kg/m   General: NAD, awake, alert, responsive to questions Head: Normocephalic atraumatic CV: Regular rate and rhythm no murmurs rubs or gallops Respiratory: Clear to ausculation bilaterally, no wheezes rales or crackles, chest rises symmetrically,  no increased work of breathing ASSESSMENT/PLAN:   Assessment & Plan Type 2 diabetes mellitus with  hyperglycemia, without long-term current use of insulin (HCC) A1c 5.7 today.  No lows. 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  Radiology contacted at last visit and recommended follow-up in 6 to 8 weeks. -CT chest with contrast Anxiety Improving on Lexapro 20 mg and hydroxyzine. 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  Followed by PM&R -Flexeril refilled -Follow-up scheduled 2/6 with PM&R   Health maintenance Declined colonoscopy.  She is unsure when her last Pap smear was but in the system it appears that the last completion was in 2013. -1 month follow-up for cervical cancer screening -Declined hep C screening today, will plan to obtain at next visit  Levin Erp, MD [facility]

## 2023-08-28 NOTE — Patient Instructions (Signed)
It was great to see you! Thank you for allowing me to participate in your care!   Our plans for today:  - Your A1c is 5.7! Great job! Continue glipizide and jardiance. I referred you to ophthalmology for follow up eye exam. - I refilled flexeril and lexapro - Come back in 1 month for pap smear - We will get repeat CT of chest  Take care and seek immediate care sooner if you develop any concerns.  Levin Erp, MD

## 2023-08-28 NOTE — Assessment & Plan Note (Signed)
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  Followed by PM&R -Flexeril refilled -Follow-up scheduled 2/6 with PM&R

## 2023-08-29 LAB — MICROALBUMIN / CREATININE URINE RATIO
Creatinine, Urine: 275.6 mg/dL
Microalb/Creat Ratio: 14 mg/g{creat} (ref 0–29)
Microalbumin, Urine: 39.7 ug/mL

## 2023-08-30 ENCOUNTER — Encounter: Payer: Self-pay | Admitting: Student

## 2023-08-31 ENCOUNTER — Encounter
Payer: Medicare Other | Attending: Physical Medicine and Rehabilitation | Admitting: Physical Medicine and Rehabilitation

## 2023-08-31 ENCOUNTER — Encounter: Payer: Self-pay | Admitting: Physical Medicine and Rehabilitation

## 2023-08-31 ENCOUNTER — Other Ambulatory Visit (HOSPITAL_COMMUNITY): Payer: Self-pay

## 2023-08-31 VITALS — BP 147/96 | HR 85 | Ht 59.0 in | Wt 126.0 lb

## 2023-08-31 DIAGNOSIS — I10 Essential (primary) hypertension: Secondary | ICD-10-CM | POA: Diagnosis present

## 2023-08-31 DIAGNOSIS — I152 Hypertension secondary to endocrine disorders: Secondary | ICD-10-CM | POA: Insufficient documentation

## 2023-08-31 DIAGNOSIS — G8929 Other chronic pain: Secondary | ICD-10-CM | POA: Diagnosis present

## 2023-08-31 DIAGNOSIS — E1159 Type 2 diabetes mellitus with other circulatory complications: Secondary | ICD-10-CM | POA: Insufficient documentation

## 2023-08-31 DIAGNOSIS — Z8673 Personal history of transient ischemic attack (TIA), and cerebral infarction without residual deficits: Secondary | ICD-10-CM | POA: Insufficient documentation

## 2023-08-31 DIAGNOSIS — G629 Polyneuropathy, unspecified: Secondary | ICD-10-CM | POA: Insufficient documentation

## 2023-08-31 DIAGNOSIS — Z794 Long term (current) use of insulin: Secondary | ICD-10-CM

## 2023-08-31 DIAGNOSIS — M25512 Pain in left shoulder: Secondary | ICD-10-CM | POA: Diagnosis present

## 2023-08-31 MED ORDER — CAPSAICIN-CLEANSING GEL 8 % EX KIT
4.0000 | PACK | Freq: Once | CUTANEOUS | Status: AC
  Administered 2023-08-31: 4 via TOPICAL

## 2023-08-31 MED ORDER — ACETAMINOPHEN-CODEINE 300-30 MG PO TABS
1.0000 | ORAL_TABLET | Freq: Two times a day (BID) | ORAL | 2 refills | Status: DC | PRN
  Filled 2023-08-31 – 2023-09-13 (×2): qty 60, 30d supply, fill #0
  Filled 2023-11-02: qty 60, 30d supply, fill #1
  Filled 2023-12-04: qty 60, 30d supply, fill #2

## 2023-08-31 MED ORDER — LIDOCAINE 5 % EX PTCH
1.0000 | MEDICATED_PATCH | CUTANEOUS | 0 refills | Status: DC
  Filled 2023-08-31 – 2023-09-04 (×2): qty 30, 30d supply, fill #0

## 2023-08-31 NOTE — Progress Notes (Signed)
 -  Discussed Qutenza as an option for neuropathic pain control. Discussed that this is a capsaicin patch, stronger than capsaicin cream. Discussed that it is currently approved for diabetic peripheral neuropathy and post-herpetic neuralgia, but that it has also shown benefit in treating other forms of neuropathy. Provided patient with link to site to learn more about the patch: https://www.clark.biz/. Discussed that the patch would be placed in office and benefits usually last 3 months. Discussed that unintended exposure to capsaicin can cause severe irritation of eyes, mucous membranes, respiratory tract, and skin, but that Qutenza is a local treatment and does not have the systemic side effects of other nerve medications. Discussed that there may be pain, itching, erythema, and decreased sensory function associated with the application of Qutenza. Side effects usually subside within 1 week. A cold pack of analgesic medications can help with these side effects. Blood pressure can also be increased due to pain associated with administration of the patch.   4 patches of Qutenza [phone]) was applied to bilateral feet. Ice packs were applied during the procedure to ensure patient comfort. Blood pressure was monitored every 15 minutes. The patient tolerated the procedure well. Post-procedure instructions were given and follow-up has been scheduled.  Topical system measures 14cm x20cm (280cm for a total 1120units) were applied which will cause deeper penetration for destruction of the peripheral nerve using a chemical (Qutenza) which infuses into the skin like an injection and heat technique (occlusive, compressive dressing cauing endothermic heat technique)

## 2023-08-31 NOTE — Addendum Note (Signed)
 Addended by: Liam Redhead on: 08/31/2023 11:30 AM   Modules accepted: Orders, Level of Service

## 2023-08-31 NOTE — Progress Notes (Signed)
 Subjective:    Patient ID: [name], female    DOB: 01-02-1976, 48 y.o.   MRN: [id number]  HPI [name] is a 48 year old woman who presents for follow-up of CVA  1) CVA -she would benefit from home health -she is receiving support from her ather -she is able to walk to and from the bathroom easily.  -AFO causing her pain, she feels she walks better without it -she has been receiving home therapy  2) HTN: -BP is 147/96 today  3) Anxiety:  -she needs refill of hydroxyzine   4) insomnia: -needs refill of her trazoodone  5) Peripheral neuropathy:  -pain is present in both feet -feels like burning -asks for tylenol  #3  6) Muscle spasms: -would like muscle relaxer   Pain Inventory Average Pain 10 Pain Right Now 10 My pain is constant, sharp, and burning  LOCATION OF PAIN  Lt arm, left leg & both feet  BOWEL Number of stools per week: 2 Oral laxative use Yes  Type of laxative unknown Enema or suppository use No  History of colostomy No  Incontinent No   BLADDER Normal and Pads    Mobility ability to climb steps?  no do you drive?  no use a wheelchair needs help with transfers Do you have any goals in this area?  yes  Function 3 /2024  Neuro/Psych trouble walking spasms confusion depression anxiety  Prior Studies None  Physicians involved in your care Any changes since last visit?  no   Family History  Problem Relation Age of Onset   Diabetes Mother    Hypertension Mother    Hypertension Father    Diabetes Father    Social History   Socioeconomic History   Marital status: Single    Spouse name: Not on file   Number of children: Not on file   Years of education: Not on file   Highest education level: Not on file  Occupational History   Not on file  Tobacco Use   Smoking status: Never   Smokeless tobacco: Never   Tobacco comments:    Never smoke 10/22/21  Vaping Use   Vaping status: Never Used  Substance and Sexual  Activity   Alcohol use: No    Alcohol/week: 0.0 standard drinks of alcohol   Drug use: No   Sexual activity: Not Currently    Birth control/protection: None  Other Topics Concern   Not on file  Social History Narrative   Are you right handed or left handed? right   Are you currently employed ?    What is your current occupation?   Do you live at home alone?   Who lives with you?    What type of home do you live in: 1 story or 2 story?        Social Drivers of Corporate Investment Banker Strain: Low Risk  (04/24/2023)   Received from [facility]   Overall Financial Resource Strain (CARDIA)    Difficulty of Paying Living Expenses: Not hard at all  Food Insecurity: No Food Insecurity (04/24/2023)   Received from [facility]   Hunger Vital Sign    Worried About Running Out of Food in the Last Year: Never true    Ran Out of Food in the Last Year: Never true  Transportation Needs: No Transportation Needs (04/24/2023)   Received from [facility] - Transportation    Lack of Transportation (Medical): No  Lack of Transportation (Non-Medical): No  Physical Activity: Inactive (04/04/2023)   Exercise Vital Sign    Days of Exercise per Week: 0 days    Minutes of Exercise per Session: 0 min  Stress: Stress Concern Present (04/04/2023)   [employer] of Occupational Health - Occupational Stress Questionnaire    Feeling of Stress : Very much  Social Connections: Socially Isolated (04/04/2023)   Social Connection and Isolation Panel [NHANES]    Frequency of Communication with Friends and Family: Once a week    Frequency of Social Gatherings with Friends and Family: Never    Attends Religious Services: Never    Database Administrator or Organizations: No    Attends Banker Meetings: Never    Marital Status: Never married   Past Surgical History:  Procedure Laterality Date   BIOPSY  11/05/2021   Procedure: BIOPSY;  Surgeon: Stacia Glendia BRAVO, MD;   Location: [facility] ENDOSCOPY;  Service: Gastroenterology;;   CERVIX LESION DESTRUCTION     CESAREAN SECTION     ESOPHAGOGASTRODUODENOSCOPY (EGD) WITH PROPOFOL  N/A 11/05/2021   Procedure: ESOPHAGOGASTRODUODENOSCOPY (EGD) WITH PROPOFOL ;  Surgeon: Stacia Glendia BRAVO, MD;  Location: [facility] ENDOSCOPY;  Service: Gastroenterology;  Laterality: N/A;   IR CT HEAD LTD  09/24/2022   IR PERCUTANEOUS ART THROMBECTOMY/INFUSION INTRACRANIAL INC DIAG ANGIO  09/24/2022   RADIOLOGY WITH ANESTHESIA N/A 09/24/2022   Procedure: IR WITH ANESTHESIA;  Surgeon: Radiologist, Medication, MD;  Location: [facility] OR;  Service: Radiology;  Laterality: N/A;   RIGHT/LEFT HEART CATH AND CORONARY ANGIOGRAPHY N/A 03/29/2019   Procedure: RIGHT/LEFT HEART CATH AND CORONARY ANGIOGRAPHY;  Surgeon: Rolan Ezra RAMAN, MD;  Location[facility] INVASIVE CV LAB;  Service: Cardiovascular;  Laterality: N/A;   RIGHT/LEFT HEART CATH AND CORONARY ANGIOGRAPHY N/A 09/20/2021   Procedure: RIGHT/LEFT HEART CATH AND CORONARY ANGIOGRAPHY;  Surgeon: Rolan Ezra RAMAN, MD;  Location: [facility] INVASIVE CV LAB;  Service: Cardiovascular;  Laterality: N/A;   Past Medical History:  Diagnosis Date   Abnormal Pap smear    Acute combined systolic and diastolic heart failure (HCC) 02/19/2019   Wt Readings from Last 3 Encounters:  09/18/21  84.7 kg  09/02/21  84.4 kg  08/31/21  84.2 kg         Acute heart failure (HCC) 01/29/2022   Acute on chronic combined systolic and diastolic CHF (congestive heart failure) (HCC) 02/19/2019   Acute on chronic congestive heart failure (HCC)    Acute on chronic congestive heart failure (HCC)    Acute on chronic diastolic CHF (congestive heart failure) (HCC) 10/19/2021   Acute on chronic systolic heart failure (HCC) 02/22/2022   Anemia    Asthma    Atrial fibrillation (HCC)    history of paroxysmal atrial fibrillation   CHF (congestive heart failure) (HCC) 11/02/2021   CHF exacerbation (HCC) 10/17/2021   Diabetes mellitus    DKA, type 2 (HCC) 02/06/2019    Elevated troponin 09/30/2019   Heart failure with reduced ejection fraction (HCC)    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  Consider chronic etiology given continued fertility issues   Hypertension    Hypertensive emergency 09/30/2019   Hypertensive urgency 06/02/2022   Major depressive disorder 02/08/2012   Reports that her infertility is a major cause of her depression Reports Prozac  has worked in the past for her.     Migraines    Morbid obesity (HCC) 02/06/2019   Nausea & vomiting  06/26/2022   Nonischemic cardiomyopathy (HCC) 09/22/2021   NSTEMI (non-ST elevated myocardial infarction) (HCC) 02/23/2022   Obstructive sleep apnea 06/30/2015   Polycystic ovarian disease    BP (!) 147/96   Pulse 85   Wt 126 lb (57.2 kg) Comment: reported  SpO2 99%   BMI 25.45 kg/m   Opioid Risk Score:   Fall Risk Score:  `1  Depression screen [facility] 2/9     07/27/2023    2:59 PM 07/04/2023   10:56 AM 04/04/2023   10:32 AM 02/21/2023   10:45 AM 08/05/2022    9:39 AM 01/19/2022    3:07 PM 10/22/2021    1:31 PM  Depression screen PHQ 2/9  Decreased Interest 3 1 3 2 3 1 1   Down, Depressed, Hopeless 3 1 3 2 3 2 2   PHQ - 2 Score 6 2 6 4 6 3 3   Altered sleeping 3  3 0 3 3 2   Tired, decreased energy 3  3 1 3 2 1   Change in appetite 0  0 0 3 2 1   Feeling bad or failure about yourself  2  2 3  2 1   Trouble concentrating 2  2 2  2  0  Moving slowly or fidgety/restless 2  0 0  0 1  Suicidal thoughts 0  0 1 0 1 1  PHQ-9 Score 18  16 11 15 15 10   Difficult doing work/chores Very difficult  Very difficult         Review of Systems  Gastrointestinal:  Positive for constipation.  Musculoskeletal:  Positive for gait problem.       Lt arm pain, pain in both feet, left leg   All other systems reviewed and are negative.      Objective:   Physical Exam Gen: no distress, normal appearing HEENT: oral mucosa pink and moist, NCAT Cardio:  Reg rate Chest: normal effort, normal rate of breathing Abd: soft, non-distended Ext: no edema Psych: pleasant, normal affect Skin: intact Neuro: Alert and oriented x3, left sided weakness       Assessment & Plan:  1) CVA continue home therapy -provided information regarding [facility] program Prescribing Home Zynex NexWave Stimulator Device and supplies as needed. IFC, NMES and TENS medically necessary Treatment Rx: Daily @ 30-40 minutes per treatment PRN. Zynex NexWave only, no substitutions. Treatment Goals: 1) To reduce and/or eliminate pain 2) To improve functional capacity and Activities of daily living 3) To reduce or prevent the need for oral medications 4) To improve circulation in the injured region 5) To decrease or prevent muscle spasm and muscle atrophy 6) To provide a self-management tool to the patient The patient has not sufficiently improved with conservative care. Numerous studies indexed by Medline and PubMed.gov have shown Neuromuscular, Interferential, and TENS stimulators to reduce pain, improve function, and reduce medication use in injured patients. Continued use of this evidence based, safe, drug free treatment is both reasonable and medically necessary at this time.   2) Anxiety: -prescribed hydroxyzine   3) Insomnia: -trazodone  prescribed  4) HTN: -continue home blood pressure medications -Advised checking BP daily at home and logging results to bring into follow-up appointment with PCP and myself. -Reviewed BP meds today.  -Advised regarding healthy foods that can help lower blood pressure and provided with a list: 1) citrus foods- high in vitamins and minerals 2) salmon and other fatty fish - reduces inflammation and oxylipins 3) swiss chard (leafy green)- high level of nitrates 4) pumpkin seeds-  one of the best natural sources of magnesium  5) Beans and lentils- high in fiber, magnesium , and potassium 6) Berries- high in flavonoids 7) Amaranth  (whole grain, can be cooked similarly to rice and oats)- high in magnesium  and fiber 8) Pistachios- even more effective at reducing BP than other nuts 9) Carrots- high in phenolic compounds that relax blood vessels and reduce inflammation 10) Celery- contain phthalides that relax tissues of arterial walls 11) Tomatoes- can also improve cholesterol and reduce risk of heart disease 12) Broccoli- good source of magnesium , calcium , and potassium 13) Greek yogurt: high in potassium and calcium  14) Herbs and spices: Celery seed, cilantro, saffron, lemongrass, black cumin, ginseng, cinnamon, cardamom, sweet basil, and ginger 15) Chia and flax seeds- also help to lower cholesterol and blood sugar 16) Beets- high levels of nitrates that relax blood vessels  17) spinach and bananas- high in potassium  -Provided lise of supplements that can help with hypertension:  1) magnesium : one high quality brand is Bioptemizers since it contains all 7 types of magnesium , otherwise over the counter magnesium  gluconate 400mg  is a good option 2) B vitamins 3) vitamin D  4) potassium 5) CoQ10 6) L-arginine 7) Vitamin C 8) Beetroot -Educated that goal BP is 120/80. -Made goal to incorporate some of the above foods into diet.    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 Discussed that this is a capsaicin  patch, stronger than capsaicin  cream. Discussed that it is currently approved for diabetic peripheral neuropathy and post-herpetic neuralgia, but that it has also shown benefit in treating other forms of neuropathy. Provided patient with link to site to learn more about the patch: https://www.qutenza .com/. Discussed that the patch would be placed in office and benefits usually last 3 months. Discussed that unintended exposure to capsaicin  can cause severe irritation of eyes, mucous membranes, respiratory tract, and skin, but that Qutenza  is a local treatment  and does not have the systemic side effects of other nerve medications. Discussed that there may be pain, itching, erythema, and decreased sensory function associated with the application of Qutenza . Side effects usually subside within 1 week. A cold pack of analgesic medications can help with these side effects. Blood pressure can also be increased due to pain associated with administration of the patch.   Prescribing Home Zynex NexWave Stimulator Device and supplies as needed. IFC, NMES and TENS medically necessary Treatment Rx: Daily @ 30-40 minutes per treatment PRN. Zynex NexWave only, no substitutions. Treatment Goals: 1) To reduce and/or eliminate pain 2) To improve functional capacity and Activities of daily living 3) To reduce or prevent the need for oral medications 4) To improve circulation in the injured region 5) To decrease or prevent muscle spasm and muscle atrophy 6) To provide a self-management tool to the patient The patient has not sufficiently improved with conservative care. Numerous studies indexed by Medline and PubMed.gov have shown Neuromuscular, Interferential, and TENS stimulators to reduce pain, improve function, and reduce medication use in injured patients. Continued use of this evidence based, safe, drug free treatment is both reasonable and medically necessary at this time.   Pain contract and urine sample performed today  -glucose meter and supplies ordered  6) Muscle spasms:  -flexeril  ordered prn  7) chronic left shoulder pain: -oxycodone  switched to tylenol  #3 as per patient's request -f/u with Fidela on the 3rd and can switch back if tylenol  #3 is not effective -lidocaine  patch prescribed

## 2023-09-01 ENCOUNTER — Other Ambulatory Visit (HOSPITAL_COMMUNITY): Payer: Self-pay

## 2023-09-04 ENCOUNTER — Other Ambulatory Visit (HOSPITAL_COMMUNITY): Payer: Self-pay

## 2023-09-05 ENCOUNTER — Other Ambulatory Visit (HOSPITAL_COMMUNITY): Payer: Self-pay

## 2023-09-06 ENCOUNTER — Other Ambulatory Visit (HOSPITAL_COMMUNITY): Payer: Self-pay

## 2023-09-08 ENCOUNTER — Other Ambulatory Visit (HOSPITAL_COMMUNITY): Payer: Self-pay

## 2023-09-13 ENCOUNTER — Other Ambulatory Visit (HOSPITAL_COMMUNITY): Payer: Self-pay

## 2023-09-13 ENCOUNTER — Encounter: Payer: Self-pay | Admitting: Internal Medicine

## 2023-09-13 ENCOUNTER — Ambulatory Visit: Payer: Medicare Other | Attending: Internal Medicine | Admitting: Internal Medicine

## 2023-09-13 VITALS — BP 130/90 | HR 91 | Ht 59.0 in | Wt 125.0 lb

## 2023-09-13 DIAGNOSIS — I428 Other cardiomyopathies: Secondary | ICD-10-CM | POA: Insufficient documentation

## 2023-09-13 DIAGNOSIS — I513 Intracardiac thrombosis, not elsewhere classified: Secondary | ICD-10-CM | POA: Diagnosis present

## 2023-09-13 NOTE — Patient Instructions (Addendum)
Medication Instructions:  Continue same medications *If you need a refill on your cardiac medications before your next appointment, please call your pharmacy*   Lab Work: None ordered   Testing/Procedures: None ordered   Follow-Up: At [facility], you and your health needs are our priority.  As part of our continuing mission to provide you with exceptional heart care, we have created designated Provider Care Teams.  These Care Teams include your primary Cardiologist (physician) and Advanced Practice Providers (APPs -  Physician Assistants and Nurse Practitioners) who all work together to provide you with the care you need, when you need it.  We recommend signing up for the patient portal called "MyChart".  Sign up information is provided on this After Visit Summary.  MyChart is used to connect with patients for Virtual Visits (Telemedicine).  Patients are able to view lab/test results, encounter notes, upcoming appointments, etc.  Non-urgent messages can be sent to your provider as well.   To learn more about what you can do with MyChart, go to [email or web address].    Your next appointment:  6 months   Call in April to schedule August appointment    Provider:  Reather Littler NP

## 2023-09-13 NOTE — Progress Notes (Addendum)
 Cardiology Office Note:    Date:  09/13/2023   ID:  [name], DOB 06/13/1976, MRN [id number]  PCP:  Levin Erp, MD   [facility] Providers Cardiologist:  Maisie Fus, MD Electrophysiologist:  Regan Lemming, MD     Referring MD: Levin Erp, MD   Chief Complaint  Patient presents with   Follow-up    3 months.  Hospital FU  History of Present Illness:    [name] is a 48 y.o. 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%) felt to be nonischemic cardiomyopathy, paroxysmal atrial fibrillation on Xarelto, type 2 DM, anemia, HTN. Shewas initially diagnosed with nonischemic cardiomyopathy in 12/2018. Echocardiogram at that time showed EF 20-25%, diastolic function could not be evaluated as patient was in atrial fibrillation at the time of the study. She was seen by EP in 12/2018 as there were concerns that her cardiomyopathy was tachy induced. Patient was started on GDMT and her afib regiment was optimized. Follow up echocardiogram 3 months later in 03/2019 showed EF 20-25%,  diastolic parameters consistent with restrictive filling, LV diffuse hypokinesis, apical thrombus, moderate RV systolic dysfunction, moderate TR, mild MR. As her EF had continued to be low, she underwent R/L heart catheterization on 03/29/2019 to rule out ischemia. Cath showed no obstructive CAD, R>L heart failure with low PAPI suggesting significant RV dysfunction, primarily pulmonary venous HTN, low cardiac output, CI 2.07. Patient was started on coumadin for LV thrombus. She was following with the HF clinic.  After her cath, a follow up echo in 09/2019 showed EF <20%, moderate LVH, grade II diastolic dysfunction, severe RV dysfunction, moderately elevated pulmonary artery systolic pressure. Echo from 12/2019 did show some improvement in EF to 35-40%, grade I DD.    Patient was admitted to the hospital in 08/2021 for evaluation of CHF and chest pain. Echo from  09/18/21 showed EF 20%, grade III diastolic dysfunction, elevated LVEDP, severely reduced RV systolic function, moderately elevated PA systolic pressure, mild MR. Underwent R/L heart catheterization on 09/20/21 that showed mild, nonobstructive CAD, nonischemic cardiomyopathy, elevated right and left heart filling pressures, preserved cardiac output, pulmonary venous htn. At that time, Dr. Shirlee Latch believed that her cardiomyopathy could be caused by poorly controlled DM or HTN. Patient underwent Cardiac MRI to rule out myocarditis on 09/20/21. Study showed mildly dilated LV with EF 23%, diffuse hypokinesis with inferior akinesis, moderate RV systolic dysfunction with EF 32%, no definitive evidence of prior MI, infiltrative disease, myocarditis. LV thrombus was not seen on cMRI, continued on coumadin for prevention. Later, patient was transitioned to Xarelto due to poor compliance with coumadin and subtherapeutic INRs. She was reportedly rude to staff and was dismissed from HF clinic.   She's had recurrent admissions thought 2/2 difficulty with medication compliance.   Patient was last seen by cardiology on 02/02/22 for an outpatient appointment. At that time, patient admitted to medication noncompliance. She had visited the ER for diffuse body pain and CHF, but she left before being seen. Encouraged medication compliance and diuretic use. She underwent CT chest negative for acute pulmonary embolus, ovoid hypodensity/filling defect within the left ventricular apex (needs echo to exclude LV thrombus), cardiomegaly with reflux of contrast into the hepatic veins consistent with elevated right heart pressures, heterogenous enhancement of the liver likely due to passive congestion. A repeat echo 02/23/2022 showed 2.2 x 1.4 cm thrombus in the LV with EF < 20%. RV function was moderately reduced. RVSP 44 mmhg.  Mild MR. She was diuresed and continued on xarelto. Plan was for to ensure FU before starting coumadin again, since clot  remained on DOAC   Today, She is having chest pressure under the breast. It feels like a pin. She has no PND/orthopnea. No LE edema.    Interim hx 09/08/2022 INR has been 2-3 for a month. Notes leg swelling.   Interim hx 11/09/2022 Unfortunately she has missed a few visits.  Since I last saw her, she presented in March with SOB. INR was subtherapeutic. Plan was then to switch back to DOAC. During her stay she had an acute change in her mental status on 3/2. She had left sided weakness and a right gaze deviation. Did not get TPA 2/2 being on heparin. Her MRI showed a large acute infarct in the right MCA, and smaller area of acute infarct in the right ACA. Persistent occlusion of a right M2 Jaylina Ramdass. She underwent a mechanical thrombectomy for proximal right M2 occlusion. She was managed in the neuro- ICU. Family was at the bedside.   Her left side is weak , she is in a wheel chair. She denies CP or shortness of breath. She is taking her medications.  No bleeding.   Interim hx 09/13/2023 Still has some left sided weakness. She denies any CP no SOB. She gets around with her wheelchair. She can ambulate a little.  She went to the ED on 08/19/2023 for left-sided pain she notes this has been persistent since her stroke.  She is on pain medication.  Current Medications: Current Meds  Medication Sig   Accu-Chek Softclix Lancets lancets USE 1  TO CHECK GLUCOSE 4 TIMES DAILY   Accu-Chek Softclix Lancets lancets Use each in the morning, at noon, and at bedtime.   acetaminophen (TYLENOL) 500 MG tablet Take 500-1,000 mg by mouth every 6 (six) hours as needed for mild pain or headache (OR CRAMPING).   acetaminophen-codeine (TYLENOL #3) 300-30 MG tablet Take 1 tablet by mouth 2 (two) times daily as needed for moderate pain (pain score 4-6).   albuterol (VENTOLIN HFA) 108 (90 Base) MCG/ACT inhaler Inhale 2 puffs into the lungs every 6 (six) hours as needed for wheezing or shortness of breath.   atorvastatin  (LIPITOR) 80 MG tablet Take 1 tablet (80 mg total) by mouth daily after supper.   bisacodyl 5 MG EC tablet Take 2 tablets (10 mg total) by mouth every 24 hours as needed for constipation.   blood glucose meter kit and supplies Use up to 4 (four) times daily as directed   Blood Glucose Monitoring Suppl (ACCU-CHEK GUIDE) w/Device KIT Use up to 4 (four) times daily.   Cholecalciferol (VITAMIN D) 125 MCG (5000 UT) CAPS Take one capsule by mouth once daily.   cyclobenzaprine (FLEXERIL) 5 MG tablet Take 1 tablet (5 mg total) by mouth 3 (three) times daily as needed for muscle spasms.   dabigatran (PRADAXA) 150 MG CAPS capsule Take 1 capsule (150 mg total) by mouth 2 (two) times daily for anticoagulant.   empagliflozin (JARDIANCE) 25 MG TABS tablet Take 1 tablet (25 mg total) by mouth daily.   escitalopram (LEXAPRO) 20 MG tablet Take 1 tablet (20 mg total) by mouth at bedtime.   glipiZIDE (GLUCOTROL) 5 MG tablet Take 1 tablet (5 mg total) by mouth daily before breakfast.   glucose blood (ACCU-CHEK GUIDE) test strip USE  4 TIMES DAILY   hydrOXYzine (ATARAX) 10 MG tablet Take 1 tablet (10 mg total) by mouth every  8 (eight) hours as needed for itching.   hydrOXYzine (ATARAX) 10 MG tablet Take 1 tablet (10 mg total) by mouth 3 (three) times daily as needed for anxiety.   levETIRAcetam (KEPPRA) 750 MG tablet Take 1 tablet (750 mg total) by mouth 2 (two) times daily for seizures.   lidocaine (LIDODERM) 5 % Place 1 patch onto the skin daily. Remove & Discard patch within 12 hours or as directed by MD   losartan (COZAAR) 50 MG tablet Take 1 tablet (50 mg total) by mouth daily.   melatonin 5 MG TABS Take 1 tablet (5 mg total) by mouth at bedtime.   metoprolol tartrate (LOPRESSOR) 25 MG tablet Take 1 tablet (25 mg total) by mouth 2 (two) times daily.   spironolactone (ALDACTONE) 25 MG tablet Take 1 tablet (25 mg total) by mouth daily.   torsemide (DEMADEX) 20 MG tablet Take 1 tablet (20 mg total) by mouth daily.    traZODone (DESYREL) 150 MG tablet Take 1 tablet (150 mg total) by mouth at bedtime for insomnia.   Ubrogepant (UBRELVY) 100 MG TABS Take 1 tablet (100 mg total) by mouth daily as needed.     Allergies:   Fish allergy, Shellfish allergy, Metformin, Metformin and related, Nsaids, and Trulicity [dulaglutide]   Social History   Socioeconomic History   Marital status: Single    Spouse name: Not on file   Number of children: Not on file   Years of education: Not on file   Highest education level: Not on file  Occupational History   Not on file  Tobacco Use   Smoking status: Never   Smokeless tobacco: Never   Tobacco comments:    Never smoke 10/22/21  Vaping Use   Vaping status: Never Used  Substance and Sexual Activity   Alcohol use: No    Alcohol/week: 0.0 standard drinks of alcohol   Drug use: No   Sexual activity: Not Currently    Birth control/protection: None  Other Topics Concern   Not on file  Social History Narrative   Are you right handed or left handed? right   Are you currently employed ?    What is your current occupation?   Do you live at home alone?   Who lives with you?    What type of home do you live in: 1 story or 2 story?        Social Drivers of Corporate investment banker Strain: Low Risk  (04/24/2023)   Received from [facility]   Overall Financial Resource Strain (CARDIA)    Difficulty of Paying Living Expenses: Not hard at all  Food Insecurity: No Food Insecurity (04/24/2023)   [facility] from [facility]   Hunger Vital Sign    Worried About Running Out of Food in the Last Year: Never true    Ran Out of Food in the Last Year: Never true  Transportation Needs: No Transportation Needs (04/24/2023)   Received from [facility] - Transportation    Lack of Transportation (Medical): No    Lack of Transportation (Non-Medical): No  Physical Activity: Inactive (04/04/2023)   Exercise Vital Sign    Days of Exercise per Week: 0 days     Minutes of Exercise per Session: 0 min  Stress: Stress Concern Present (04/04/2023)   [employer] of Occupational Health - Occupational Stress Questionnaire    Feeling of Stress : Very much  Social Connections: Socially Isolated (04/04/2023)   Social  Connection and Isolation Panel [NHANES]    Frequency of Communication with Friends and Family: Once a week    Frequency of Social Gatherings with Friends and Family: Never    Attends Religious Services: Never    Database administrator or Organizations: No    Attends Engineer, structural: Never    Marital Status: Never married     Family History: The patient's family history includes Diabetes in her father and mother; Hypertension in her father and mother.  ROS:   Please see the history of present illness.     All other systems reviewed and are negative.  EKGs/Labs/Other Studies Reviewed:    The following studies were reviewed today:   EKG:    11/09/2022- NSR, LAD, LVH  Recent Labs: 09/24/2022: TSH 3.269 11/21/2022: Magnesium 1.7 03/08/2023: B Natriuretic Peptide 764.2 04/04/2023: ALT 16 08/19/2023: BUN 15; Creatinine, Ser 0.63; Hemoglobin 11.9; Platelets 263; Potassium 3.5; Sodium 136   Recent Lipid Panel    Component Value Date/Time   CHOL 136 09/24/2022 0130   CHOL 266 (H) 10/20/2017 1054   TRIG 63 09/28/2022 0456   HDL 27 (L) 09/24/2022 0130   HDL 43 10/20/2017 1054   CHOLHDL 5.0 09/24/2022 0130   VLDL 12 09/24/2022 0130   LDLCALC 97 09/24/2022 0130   LDLCALC 195 (H) 10/20/2017 1054   LDLDIRECT 154 (H) 01/20/2014 1522     Risk Assessment/Calculations:        Physical Exam:    VS:  Ht 4\' 11"  (1.499 m)   Wt 125 lb (56.7 kg)   BMI 25.25 kg/m     Wt Readings from Last 3 Encounters:  09/13/23 125 lb (56.7 kg)  08/31/23 126 lb (57.2 kg)  08/28/23 126 lb (57.2 kg)     GEN:  Well nourished, well developed in no acute distress HEENT: Normal NECK: No sig JVD LYMPHATICS: No  lymphadenopathy CARDIAC: RRR, no murmurs, rubs, gallops RESPIRATORY:  Clear to auscultation without rales, wheezing or rhonchi  ABDOMEN: Soft, non-tender, non-distended MUSCULOSKELETAL:  no LE edema SKIN: Warm and dry NEUROLOGIC:  Alert and oriented x 3 PSYCHIATRIC:  flat affect   ASSESSMENT:    NYHA Class II-III Non Ischemic CM: EF 15%. Will continue GDMT.  Unlikely ICD candidate with persistent LV thrombus. Not currently ADHF candidate with prior dismissal and lack of medication compliance. Her family is helping her take her medications now. Has low PAPI LVAD not optimal. Again with her hx and s/p stroke/DM2 poorly controlled, unlikely a candidate for transplant. We discussed that she is not an ADHF candidate. - euvolemic -She had low Bps in the past with entresto.  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 on jardiance 25 mg daily  Overall clinically she looks better.  She is accompanied by her father who helps as her caregiver.  PLAN:    In order of problems listed above:    FU in 6 months with an APP      Medication Adjustments/Labs and Tests Ordered: Current medicines are reviewed at length with the patient today.  Concerns regarding medicines are  outlined above.  No orders of the defined types were placed in this encounter.  No orders of the defined types were placed in this encounter.   There are no Patient Instructions on file for this visit.   Signed, Maisie Fus, MD  09/13/2023 9:55 AM    [facility]

## 2023-09-28 ENCOUNTER — Ambulatory Visit: Payer: Self-pay | Admitting: Student

## 2023-09-28 ENCOUNTER — Other Ambulatory Visit (HOSPITAL_COMMUNITY): Payer: Self-pay

## 2023-09-28 ENCOUNTER — Encounter: Payer: Self-pay | Admitting: Student

## 2023-09-28 VITALS — BP 154/84 | HR 88 | Ht 59.0 in | Wt 124.0 lb

## 2023-09-28 DIAGNOSIS — F419 Anxiety disorder, unspecified: Secondary | ICD-10-CM | POA: Diagnosis not present

## 2023-09-28 DIAGNOSIS — F0630 Mood disorder due to known physiological condition, unspecified: Secondary | ICD-10-CM

## 2023-09-28 DIAGNOSIS — M79605 Pain in left leg: Secondary | ICD-10-CM | POA: Diagnosis not present

## 2023-09-28 DIAGNOSIS — F5101 Primary insomnia: Secondary | ICD-10-CM

## 2023-09-28 MED ORDER — GABAPENTIN 100 MG PO CAPS
100.0000 mg | ORAL_CAPSULE | Freq: Every day | ORAL | 1 refills | Status: DC
  Filled 2023-09-28: qty 30, 30d supply, fill #0
  Filled 2023-11-02: qty 30, 30d supply, fill #1

## 2023-09-28 MED ORDER — DABIGATRAN ETEXILATE MESYLATE 150 MG PO CAPS
150.0000 mg | ORAL_CAPSULE | Freq: Two times a day (BID) | ORAL | 0 refills | Status: DC
  Filled 2023-09-28: qty 60, 30d supply, fill #0

## 2023-09-28 MED ORDER — TRAZODONE HCL 150 MG PO TABS
150.0000 mg | ORAL_TABLET | Freq: Every evening | ORAL | 0 refills | Status: DC
  Filled 2023-09-28: qty 30, 30d supply, fill #0

## 2023-09-28 NOTE — Progress Notes (Addendum)
    SUBJECTIVE:   CHIEF COMPLAINT / HPI:   Was scheduled for a Pap smear but was not mentally prepared for the procedure  Mood changes  Depression The patient expressed feelings of sadness and frustration, and requested therapy.  She is taking her hydroxyzine and Lexapro that have been helping but still not completely alleviating everything.  She does have periods of time where she gets emotional and cries.  Did have an episode while we were in the room.  Her father who accompanied her also states that she would benefit from therapy.  Patient does agree to this.  Denies any suicidal ideation or plan.  She feels very tired of going to medical doctor appointments and with her medical situation in general.  She states that she feels safe at home on confidential examination.  Chronic Pain  Insomnia The patient also reported needing refills for gabapentin and trazodone. The patient was previously taking gabapentin, but it was stopped for unknown reasons. The patient reported that gabapentin was supposed to help with her pain.The patient has been off gabapentin for a couple of months.   HTN patient is taking spironolactone 25mg  daily, torsemide 20mg  daily, Jardiance 25mg  daily, and metoprolol twice a day.    Also has some confusion about whether the patient should be taking Eliquis or Pradaxa. The patient reported needing a refill for Pradaxa  PERTINENT  PMH / PSH: reviewed  OBJECTIVE:   BP (!) 160/88   Pulse 88   Ht 4\' 11"  (1.499 m)   Wt 124 lb (56.2 kg)   BMI 25.04 kg/m   General: NAD, awake, alert, responsive to questions, wheelchair bound Head: Normocephalic atraumatic CV: Regular rate and rhythm no murmurs rubs or gallops Respiratory: Clear to ausculation bilaterally, no wheezes rales or crackles, chest rises symmetrically,  no increased work of breathing  ASSESSMENT/PLAN:   Assessment & Plan Mood disorder due to a general medical condition Has been having depressive symptoms  and increased episodes of sadness related to her physical condition lately.  No SI. -Social work referral for therapy resources -Continue Lexapro 20 and hydroxyzine -Patient declines psychiatry referral today -Will space out doctors visits somewhat as well Left leg pain Chronic pain from CVA.  Follows with physical medicine and rehabilitation. -Start gabapentin 100 nightly -Continue follow-up with PM&R Primary insomnia -Refilled trazodone   Does have history of LV mural thrombus and PAF.  Patient states that she is not taking her eliquis that she knows of, she is taking Pradaxa as she states that her Eliquis was stopped on her recent hospitalization.  On recent cardiology note it acknowledges that she is taking her Eliquis.  I will reach out to Dr. Wyline Mood (cardiology) about this as well as I do not see apixaban on the medication list and seeing which DOAC to continue.  Addendum: Dr. Wyline Mood messaged back stated to continue Pradaxa as DOAC and addended her note removing eliquis  Levin Erp, MD [facility]

## 2023-09-28 NOTE — Assessment & Plan Note (Signed)
 Refilled trazodone.

## 2023-09-28 NOTE — Assessment & Plan Note (Signed)
 Has been having depressive symptoms and increased episodes of sadness related to her physical condition lately.  No SI. -Social work referral for therapy resources -Continue Lexapro 20 and hydroxyzine -Patient declines psychiatry referral today -Will space out doctors visits somewhat as well

## 2023-09-28 NOTE — Patient Instructions (Addendum)
 It was great to see you! Thank you for allowing me to participate in your care!   Our plans for today:  - I am refill gabapentin 100 mg qhs, trazodone, Pradaxa -I will reach out to your cardiologist about Eliquis -I am putting in a referral for social worker for therapy resources -Please make follow up appointment when you feel comfortable about pap smear   Therapy and Counseling Resources Most providers on this list will take Medicaid. Patients with commercial insurance or Medicare should contact their insurance company to get a list of in network providers.  [facility] (takes children) Location 1: [address] [place], [place] [address] Location 2: [address] [place], [place] [address] [phone]   [facility] (spanish speaking therapist available)(habla espanol)(take medicare and medicaid)  [address], [place], [place] [address], [email or web address] [phone]  [facility] Counseling [address] [place], [place] [address] [phone]  [facility]   [address] [place], [place] [address]      [phone]  [facility] (spanish available) [address]  [place], [place] [address] [phone]  [facility] (take [facility] and medicare) [address]  [place], [place] [address]       [phone]     [facility] (virtual only) [phone]  [facility]ov[facility]ussma[facility] [address] [place], [place] [phone]  [facility]:  [address] [place] [phone]  [facility]:  [address] [place] [phone]  [facility] (under & uninsured) [address]   [place] [phone]    [email or web address]    [facility] [address]  [place]    [phone]  [facility]  -[address]     Phone:  [phone]     [facility]-  [address]  [phone]   [facility] [address]      [place], [place] [phone]  [facility]: [address] [place], [place], [place]  [phone]   [facility] (Spanish therapist) [email or web address]  [address] [place]  [address]   [place] [address]    [phone]    [facility]   [address],  [place], [place]  [phone]   [facility]  [address] [place]  [phone]  [facility]  [address] [place], [place]        [phone]  Open Access/Walk In Clinic under & uninsured  [facility]  [address] [place], [place] [phone] Crisis [phone]  [facility] [place],  (Spanish)   [address], [place]: [phone]) 8:30 - 12; 1 - 2:30  [facility] [employer],  [address] [place], [place]    [phone]):8:30 - 12; 2 - 3PM  [facility] [employer],  [address],  [place]; [phone]):   Mon - Fri 8 AM - 5 PM  [facility] [address] [place]  MWF 12:30 to 3:00 or call to schedule an appointment  [phone]  Specific Provider options Psychology Today  [email or web address] click on find a therapist  enter your zip code left side and select or tailor a therapist for your specific need.   [facility] Provider Directory  [email or web address]  (Medicaid)   Follow all drop down to find a provider  Social Support program [facility] [place] [phone] or PhotoSolver.pl [address], [place], [place] Recovery support and educational   24- Hour Availability:   [facility]  [address] [place], [place] [phone] Crisis [phone]  [facility] [employer] [phone]  [facility]  [phone]   [facility]  [phone] (after hours)  Therapeutic Alternative/Mobile Crisis   [phone]  [place] National Suicide Hotline  [phone] Len Childs)  Call 911 or go to emergency room  [facility]  [phone]);  [place] and [employer]  [phone]); [place], [place], [place], [place], [place], [place], [place]   Take care and seek immediate care sooner if you develop any concerns.  Levin Erp, MD

## 2023-09-29 ENCOUNTER — Telehealth: Payer: Self-pay | Admitting: *Deleted

## 2023-09-29 NOTE — Progress Notes (Signed)
 Complex Care Management Note  Care Guide Note 09/29/2023 Name: [name] MRN: [id number] DOB: 1976-02-10  [name] is a 48 y.o. year old female who sees Levin Erp, MD for primary care. I reached out to [name] by phone today to offer complex care management services.  [name] was given information about Complex Care Management services today including:   The Complex Care Management services include support from the care team which includes your Nurse Care Manager, Clinical Social Worker, or Pharmacist.  The Complex Care Management team is here to help remove barriers to the health concerns and goals most important to you. Complex Care Management services are voluntary, and the patient may decline or stop services at any time by request to their care team member.   Complex Care Management Consent Status: Patient agreed to services and verbal consent obtained.   Follow up plan:  Telephone appointment with complex care management team member scheduled for:  3/17  Encounter Outcome:  Patient Scheduled  Gwenevere Ghazi  [facility]  [facility] Guide  Direct Dial: [phone]  Fax [phone]

## 2023-10-03 ENCOUNTER — Other Ambulatory Visit (HOSPITAL_COMMUNITY): Payer: Self-pay

## 2023-10-09 ENCOUNTER — Telehealth: Payer: Self-pay | Admitting: *Deleted

## 2023-10-09 ENCOUNTER — Encounter: Payer: Self-pay | Admitting: Licensed Clinical Social Worker

## 2023-10-09 NOTE — Progress Notes (Signed)
 Complex Care Management Care Guide Note  10/09/2023 Name: [name] MRN: [id number] DOB: 09/19/1975  [name] is a 48 y.o. year old female who is a primary care patient of Levin Erp, MD and is actively engaged with the care management team. I reached out to [name] by phone today to assist with re-scheduling  with the Licensed Clinical Child psychotherapist.  Follow up plan: Unsuccessful telephone outreach attempt made. A HIPAA compliant phone message was left for the patient providing contact information and requesting a return call.  Gwenevere Ghazi  [facility]  [facility], [facility] Guide  Direct Dial: [phone]  Fax [phone]

## 2023-10-09 NOTE — Progress Notes (Signed)
 Complex Care Management Care Guide Note  10/09/2023 Name: [name] MRN: [id number] DOB: [birth year]  [name] is a 48 y.o. year old female who is a primary care patient of Levin Erp, MD and is actively engaged with the care management team. I reached out to [name] by phone today to assist with re-scheduling  with the Licensed Clinical Child psychotherapist.  Follow up plan: Telephone appointment with complex care management team member scheduled for:  3/26  Gwenevere Ghazi  [facility]  [facility], [facility] Guide  Direct Dial: [phone]  Fax [phone]

## 2023-10-18 ENCOUNTER — Ambulatory Visit: Payer: Self-pay | Admitting: Licensed Clinical Social Worker

## 2023-10-18 NOTE — Patient Outreach (Signed)
 Care Coordination   Initial Visit Note   10/18/2023 Name: [name] MRN: [id number] DOB: Dec 15, 1975  [name] is a 48 y.o. year old female who sees Levin Erp, MD for primary care. I spoke with  [name] by phone today.  What matters to the patients health and wellness today?  Symptom Management    Goals Addressed             This Visit's Progress    Obtain Support with Management of Insomnia/Depression Symptoms   On track    Activities and task to complete in order to accomplish goals.   Keep all upcoming appointments discussed today Continue with compliance of taking medication prescribed by Doctor Implement healthy coping skills discussed to assist with management of symptoms Review supportive resources and/or options discussed today that would strengthen support system         SDOH assessments and interventions completed:  Yes  SDOH Interventions Today    Flowsheet Row Most Recent Value  SDOH Interventions   Food Insecurity Interventions Intervention Not Indicated  Housing Interventions Intervention Not Indicated  Transportation Interventions Intervention Not Indicated  Utilities Interventions Intervention Not Indicated        Care Coordination Interventions:  Yes, provided  Interventions Today    Flowsheet Row Most Recent Value  Chronic Disease   Chronic disease during today's visit Other  [MDD and Insomnia]  General Interventions   General Interventions Discussed/Reviewed General Interventions Discussed, Programmer, applications, Doctor Visits  Doctor Visits Discussed/Reviewed Doctor Visits Discussed  Mental Health Interventions   Mental Health Discussed/Reviewed Mental Health Discussed, Coping Strategies, Depression  [Patient endorsed difficulty managing symptoms of depression triggered by chronic health conditions. Strategies to strengthen support system discussed. Pt agreed to think options over.]  Pharmacy Interventions   Pharmacy  Dicussed/Reviewed Pharmacy Topics Discussed, Medication Adherence  Safety Interventions   Safety Discussed/Reviewed Safety Discussed       Follow up plan: Follow up call scheduled for 2-4 weeks    Encounter Outcome:  Patient Visit Completed   Jenel Lucks, LCSW [place]  [facility], [facility] Clinical Social Worker Direct Dial: [phone]  Fax: [phone] Website: [email or web address] 4:26 PM

## 2023-10-18 NOTE — Patient Instructions (Signed)
 Visit Information  Thank you for taking time to visit with me today. Please don't hesitate to contact me if I can be of assistance to you.   Following are the goals we discussed today:   Goals Addressed             This Visit's Progress    Obtain Support with Management of Insomnia/Depression Symptoms   On track    Activities and task to complete in order to accomplish goals.   Keep all upcoming appointments discussed today Continue with compliance of taking medication prescribed by Doctor Implement healthy coping skills discussed to assist with management of symptoms Review supportive resources and/or options discussed today that would strengthen support system         Our next appointment is by telephone on 4/9 at 1 PM  Please call the care guide team at [phone] if you need to cancel or reschedule your appointment.   If you are experiencing a Mental Health or Behavioral Health Crisis or need someone to talk to, please call the Suicide and Crisis Lifeline: 988 call 911   Patient verbalizes understanding of instructions and care plan provided today and agrees to view in MyChart. Active MyChart status and patient understanding of how to access instructions and care plan via MyChart confirmed with patient.     Windy Fast [facility]  [facility], [facility] Clinical Social Worker Direct Dial: [phone]  Fax: [phone] Website: [email or web address] 4:26 PM

## 2023-11-01 ENCOUNTER — Ambulatory Visit: Payer: Self-pay | Admitting: Licensed Clinical Social Worker

## 2023-11-02 ENCOUNTER — Other Ambulatory Visit (HOSPITAL_COMMUNITY): Payer: Self-pay

## 2023-11-02 ENCOUNTER — Other Ambulatory Visit: Payer: Self-pay

## 2023-11-02 ENCOUNTER — Other Ambulatory Visit: Payer: Self-pay | Admitting: Student

## 2023-11-02 DIAGNOSIS — F5101 Primary insomnia: Secondary | ICD-10-CM

## 2023-11-02 MED ORDER — TRAZODONE HCL 150 MG PO TABS
150.0000 mg | ORAL_TABLET | Freq: Every evening | ORAL | 0 refills | Status: DC
  Filled 2023-11-02: qty 30, 30d supply, fill #0

## 2023-11-06 NOTE — Patient Instructions (Signed)
 Visit Information  Thank you for taking time to visit with me today. Please don't hesitate to contact me if I can be of assistance to you before our next scheduled appointment.  Our next appointment is by telephone on 5/7 at 3:30 PM Please call the care guide team at [phone] if you need to cancel or reschedule your appointment.   Following is a copy of your care plan:   Goals Addressed             This Visit's Progress    COMPLETED: Obtain Support with Management of Insomnia/Depression Symptoms       Activities and task to complete in order to accomplish goals.   Keep all upcoming appointments discussed today Continue with compliance of taking medication prescribed by Doctor Implement healthy coping skills discussed to assist with management of symptoms Review supportive resources and/or options discussed today that would strengthen support system      VBCI Social Work Care Plan   On track    Problems:   Disease Management support and education needs related to Depression: depressed mood  CSW Clinical Goal(s):   Over the next 90 days the Patient will attend all scheduled medical appointments as evidenced by patient report and care team review of appointment completion in EMR:   demonstrate a reduction in symptoms related to Depression: depressed mood .  Interventions:  Mental Health:  Evaluation of current treatment plan related to Depression: depressed mood Active listening / Reflection utilized Depression screen reviewed Mindfulness or Relaxation training provided PHQ2/PHQ9 completed Reviewed mental health medications and discussed importance of compliance: Patient will f/up with Pharmacy for refills Solution-Focued Strategies employed:  Patient Goals/Self-Care Activities:  Continue taking your medication as prescribed.   Increase coping skills and healthy habits  Plan:   Telephone follow up appointment with care management team member scheduled for:  2-4  weeks        Please call the Suicide and Crisis Lifeline: 988 go to [facility] Urgent [facility] [address], [place] [phone]) call 911 if you are experiencing a Mental Health or Behavioral Health Crisis or need someone to talk to.  Patient verbalizes understanding of instructions and care plan provided today and agrees to view in MyChart. Active MyChart status and patient understanding of how to access instructions and care plan via MyChart confirmed with patient.     Arlis Bent [facility]  [facility], [facility] Clinical Social Worker Direct Dial: [phone]  Fax: [phone] Website: [email or web address] 9:22 AM

## 2023-11-06 NOTE — Patient Outreach (Signed)
 Complex Care Management   Visit Note  11/01/2023  Name:  [name] MRN: [id number] DOB: 1976-05-13  Situation: Referral received for Complex Care Management related to Menta/Behavioral Health diagnosis MDD  I obtained verbal consent from Patient.  Visit completed with Patient  on the phone  Background:   Past Medical History:  Diagnosis Date   Abnormal Pap smear    Acute combined systolic and diastolic heart failure (HCC) 02/19/2019   Wt Readings from Last 3 Encounters:  09/18/21  84.7 kg  09/02/21  84.4 kg  08/31/21  84.2 kg         Acute heart failure (HCC) 01/29/2022   Acute on chronic combined systolic and diastolic CHF (congestive heart failure) (HCC) 02/19/2019   Acute on chronic congestive heart failure (HCC)    Acute on chronic congestive heart failure (HCC)    Acute on chronic diastolic CHF (congestive heart failure) (HCC) 10/19/2021   Acute on chronic systolic heart failure (HCC) 02/22/2022   Anemia    Asthma    Atrial fibrillation (HCC)    history of paroxysmal atrial fibrillation   CHF (congestive heart failure) (HCC) 11/02/2021   CHF exacerbation (HCC) 10/17/2021   Diabetes mellitus    DKA, type 2 (HCC) 02/06/2019   Elevated troponin 09/30/2019   Heart failure with reduced ejection fraction (HCC)    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     Migraines    Morbid obesity (HCC) 02/06/2019   Nausea & vomiting 06/26/2022   Nonischemic cardiomyopathy (HCC) 09/22/2021   NSTEMI (non-ST elevated myocardial infarction) (HCC) 02/23/2022   Obstructive sleep apnea 06/30/2015   Polycystic ovarian disease      Assessment: Patient Reported Symptoms:  Cognitive Cognitive Status: Alert and oriented to person, place, and time      Neurological      HEENT HEENT Symptoms Reported: No symptoms reported      Cardiovascular Cardiovascular Symptoms Reported: No symptoms reported    Respiratory Respiratory Symptoms Reported: No symptoms reported    Endocrine Patient reports the following symptoms related to hypoglycemia or hyperglycemia : No symptoms reported    Gastrointestinal Gastrointestinal Symptoms Reported: No symptoms reported      Genitourinary      Integumentary Integumentary Symptoms Reported: No symptoms reported    Musculoskeletal Musculoskelatal Symptoms Reviewed: No symptoms reported        Psychosocial Psychosocial Symptoms Reported: Depression - if selected complete PHQ 2-9 Behavioral Health Conditions: Depression Behavioral Management Strategies: Medication therapy, Coping strategies, Adequate rest Major Change/Loss/Stressor/Fears (CP): Medical condition, self Techniques to Cope with Loss/Stress/Change: Diversional activities, Medication Do you feel physically threatened by others?: No      11/01/2023    1:11 PM  Depression screen PHQ 2/9  Decreased Interest 2  Down, Depressed, Hopeless 0  PHQ - 2 Score 2  Altered sleeping 3  Tired, decreased energy 2  Change in appetite 0  Feeling bad or failure about yourself  1  Trouble concentrating 0  Moving slowly or fidgety/restless 0  Suicidal thoughts 0  PHQ-9 Score 8    There were no vitals filed for this visit.  Medications Reviewed Today     Reviewed by Adriana Albany, LCSW (Social Worker) on 11/01/23 at 1323  Med List Status: <None>   Medication Order Taking? Sig Documenting Provider Last Dose Status Informant  Accu-Chek Softclix Lancets lancets [id number] No USE 1  TO CHECK GLUCOSE 4 TIMES DAILY  Patient not taking: Reported on 11/01/2023   Marveen Slick, MD Not Taking Active Father            Med Note Harles Lied, Keatin Benham D   Wed Nov 01, 2023  1:20 PM) Have to pay out of pocket fees   Accu-Chek Softclix Lancets lancets [id number] No Use each in the morning, at noon, and at bedtime.  Patient not taking: Reported on 11/01/2023   Liam Redhead, MD Not Taking Active   acetaminophen (TYLENOL) 500 MG tablet [id number] No Take 500-1,000 mg by mouth every 6 (six) hours as needed for mild pain or headache (OR CRAMPING).  Patient not taking: Reported on 11/01/2023   [provider] Not Taking Active Father  acetaminophen-codeine (TYLENOL #3) 300-30 MG tablet [id number] Yes Take 1 tablet by mouth 2 (two) times daily as needed for moderate pain (pain score 4-6). Liam Redhead, MD Taking Active   albuterol (VENTOLIN HFA) 108 (90 Base) MCG/ACT inhaler [id number] Yes Inhale 2 puffs into the lungs every 6 (six) hours as needed for wheezing or shortness of breath. Genora Kidd, MD Taking Active   atorvastatin (LIPITOR) 80 MG tablet [id number] Yes Take 1 tablet (80 mg total) by mouth daily after supper. McDiarmid, Demetra Filter, MD Taking Active   bisacodyl 5 MG EC tablet [id number] Yes Take 2 tablets (10 mg total) by mouth every 24 hours as needed for constipation.  Taking Active            Med Note (Kinaya Hilliker D   Wed Nov 01, 2023  1:22 PM) Need a refill   blood glucose meter kit and supplies [id number] Yes Use up to 4 (four) times daily as directed Raulkar, Keven Pel, MD Taking Active   Blood Glucose Monitoring Suppl (ACCU-CHEK GUIDE) w/Device KIT [id number] Yes Use up to 4 (four) times daily. Liam Redhead, MD Taking Active     Discontinued 07/01/22 [phone]          Med Note>> Louanna Rouse, [facility]   07/01/2022  9:52 AM changed to Toprol XL    Cholecalciferol (VITAMIN D) 125 MCG (5000 UT) CAPS [id number] Yes Take one capsule by mouth once daily. McDiarmid, Demetra Filter, MD Taking Active   cyclobenzaprine (FLEXERIL) 5 MG tablet [id number] Yes Take 1 tablet (5 mg total) by mouth 3 (three) times daily  as needed for muscle spasms. Genora Kidd, MD Taking Active   dabigatran (PRADAXA) 150 MG CAPS capsule [id number] Yes Take 1 capsule (150 mg total) by mouth 2 (two) times daily for anticoagulant. Genora Kidd, MD Taking Active   empagliflozin (JARDIANCE) 25 MG TABS tablet [id number] Yes Take 1 tablet (25 mg total) by mouth daily. McDiarmid, Demetra Filter, MD Taking Active   escitalopram (LEXAPRO) 20 MG tablet [id number] Yes Take 1 tablet (20 mg total) by mouth at bedtime. Genora Kidd, MD Taking Active   gabapentin (NEURONTIN) 100 MG capsule [id number] Yes Take 1 capsule (100 mg total) by mouth at bedtime. Genora Kidd, MD Taking Active   glipiZIDE (GLUCOTROL) 5 MG tablet [id number] Yes Take 1 tablet (5 mg total) by mouth daily before breakfast. McDiarmid, Demetra Filter, MD Taking Active   glucose blood (ACCU-CHEK GUIDE)  test strip [id number] Yes USE  4 TIMES DAILY Ganta, Anupa, DO Taking Active Father  hydrOXYzine (ATARAX) 10 MG tablet [id number] Yes Take 1 tablet (10 mg total) by mouth every 8 (eight) hours as needed for itching.  Taking Active   hydrOXYzine (ATARAX) 10 MG tablet [id number] Yes Take 1 tablet (10 mg total) by mouth 3 (three) times daily as needed for anxiety. Genora Kidd, MD Taking Active   levETIRAcetam (KEPPRA) 750 MG tablet [id number] Yes Take 1 tablet (750 mg total) by mouth 2 (two) times daily for seizures.  Taking Active   lidocaine (LIDODERM) 5 % [id number] Yes Place 1 patch onto the skin daily. Remove & Discard patch within 12 hours or as directed by MD Raulkar, Keven Pel, MD Taking Active   losartan (COZAAR) 50 MG tablet [id number] Yes Take 1 tablet (50 mg total) by mouth daily. McDiarmid, Demetra Filter, MD Taking Active   melatonin 5 MG TABS [id number] Yes Take 1 tablet (5 mg total) by mouth at bedtime. McDiarmid, Demetra Filter, MD Taking Active   metoprolol tartrate (LOPRESSOR) 25 MG tablet [id number] Yes Take 1 tablet (25 mg total) by mouth 2 (two) times daily. McDiarmid, Demetra Filter, MD Taking  Active   spironolactone (ALDACTONE) 25 MG tablet [id number] Yes Take 1 tablet (25 mg total) by mouth daily. McDiarmid, Demetra Filter, MD Taking Active   torsemide (DEMADEX) 20 MG tablet [id number] Yes Take 1 tablet (20 mg total) by mouth daily. Genora Kidd, MD Taking Active Father  traZODone (DESYREL) 150 MG tablet [id number] Yes Take 1 tablet (150 mg total) by mouth at bedtime for insomnia. Genora Kidd, MD Taking Active   Ubrogepant (UBRELVY) 100 MG TABS [id number] Yes Take 1 tablet (100 mg total) by mouth daily as needed. Ellene Gustin, MD Taking Active             Recommendation:   PCP Follow-up Continue utilizing strategies discussed to assist with symptom management  Follow Up Plan:   Telephone follow-up in 1 month  Alease Hunter, LCSW [facility]  [facility], [facility] Clinical Social Worker Direct Dial: [phone]  Fax: [phone] Website: [email or web address] 9:20 AM

## 2023-11-09 ENCOUNTER — Other Ambulatory Visit (HOSPITAL_COMMUNITY): Payer: Self-pay

## 2023-11-27 ENCOUNTER — Ambulatory Visit: Admitting: Pulmonary Disease

## 2023-11-28 ENCOUNTER — Encounter: Payer: Self-pay | Admitting: Pulmonary Disease

## 2023-11-28 NOTE — Progress Notes (Deleted)
 Subjective:    Patient ID: [name], female    DOB: 04-29-1976, 48 y.o.   MRN: [id number]  HPI   Pain Inventory Average Pain {NUMBERS; 0-10:5044} Pain Right Now {NUMBERS; 0-10:5044} My pain is {PAIN DESCRIPTION:21022940}  In the last 24 hours, has pain interfered with the following? General activity {NUMBERS; 0-10:5044} Relation with others {NUMBERS; 0-10:5044} Enjoyment of life {NUMBERS; 0-10:5044} What TIME of day is your pain at its worst? {time of day:24191} Sleep (in general) {BHH GOOD/FAIR/POOR:22877}  Pain is worse with: {ACTIVITIES:21022942} Pain improves with: {PAIN IMPROVES QMVH:84696295} Relief from Meds: {NUMBERS; 0-10:5044}  Family History  Problem Relation Age of Onset   Diabetes Mother    Hypertension Mother    Hypertension Father    Diabetes Father    Social History   Socioeconomic History   Marital status: Single    Spouse name: Not on file   Number of children: Not on file   Years of education: Not on file   Highest education level: Not on file  Occupational History   Not on file  Tobacco Use   Smoking status: Never   Smokeless tobacco: Never   Tobacco comments:    Never smoke 10/22/21  Vaping Use   Vaping status: Never Used  Substance and Sexual Activity   Alcohol use: No    Alcohol/week: 0.0 standard drinks of alcohol   Drug use: No   Sexual activity: Not Currently    Birth control/protection: None  Other Topics Concern   Not on file  Social History Narrative   Are you right handed or left handed? right   Are you currently employed ?    What is your current occupation?   Do you live at home alone?   Who lives with you?    What type of home do you live in: 1 story or 2 story?        Social Drivers of Corporate investment banker Strain: Low Risk  (04/24/2023)   Received from [facility]   Overall Financial Resource Strain (CARDIA)    Difficulty of Paying Living Expenses: Not hard at all  Food Insecurity: No Food  Insecurity (11/01/2023)   Hunger Vital Sign    Worried About Running Out of Food in the Last Year: Never true    Ran Out of Food in the Last Year: Never true  Transportation Needs: No Transportation Needs (11/01/2023)   PRAPARE - Administrator, Civil Service (Medical): No    Lack of Transportation (Non-Medical): No  Physical Activity: Inactive (04/04/2023)   Exercise Vital Sign    Days of Exercise per Week: 0 days    Minutes of Exercise per Session: 0 min  Stress: Stress Concern Present (04/04/2023)   [employer] of Occupational Health - Occupational Stress Questionnaire    Feeling of Stress : Very much  Social Connections: Socially Isolated (04/04/2023)   Social Connection and Isolation Panel [NHANES]    Frequency of Communication with Friends and Family: Once a week    Frequency of Social Gatherings with Friends and Family: Never    Attends Religious Services: Never    Database administrator or Organizations: No    Attends Banker Meetings: Never    Marital Status: Never married   Past Surgical History:  Procedure Laterality Date   BIOPSY  11/05/2021   Procedure: BIOPSY;  Surgeon: Elois Hair, MD;  Location: [facility] ENDOSCOPY;  Service: Gastroenterology;;   CERVIX LESION DESTRUCTION  CESAREAN SECTION     ESOPHAGOGASTRODUODENOSCOPY (EGD) WITH PROPOFOL  N/A 11/05/2021   Procedure: ESOPHAGOGASTRODUODENOSCOPY (EGD) WITH PROPOFOL ;  Surgeon: Elois Hair, MD;  Location: [facility] ENDOSCOPY;  Service: Gastroenterology;  Laterality: N/A;   IR CT HEAD LTD  09/24/2022   IR PERCUTANEOUS ART THROMBECTOMY/INFUSION INTRACRANIAL INC DIAG ANGIO  09/24/2022   RADIOLOGY WITH ANESTHESIA N/A 09/24/2022   Procedure: IR WITH ANESTHESIA;  Surgeon: Radiologist, Medication, MD;  Location: [facility] OR;  Service: Radiology;  Laterality: N/A;   RIGHT/LEFT HEART CATH AND CORONARY ANGIOGRAPHY N/A 03/29/2019   Procedure: RIGHT/LEFT HEART CATH AND CORONARY ANGIOGRAPHY;  Surgeon: Darlis Eisenmenger,  MD;  Location: [facility] INVASIVE CV LAB;  Service: Cardiovascular;  Laterality: N/A;   RIGHT/LEFT HEART CATH AND CORONARY ANGIOGRAPHY N/A 09/20/2021   Procedure: RIGHT/LEFT HEART CATH AND CORONARY ANGIOGRAPHY;  Surgeon: Darlis Eisenmenger, MD;  Location: [facility] INVASIVE CV LAB;  Service: Cardiovascular;  Laterality: N/A;   Past Surgical History:  Procedure Laterality Date   BIOPSY  11/05/2021   Procedure: BIOPSY;  Surgeon: Elois Hair, MD;  Location: [facility] ENDOSCOPY;  Service: Gastroenterology;;   CERVIX LESION DESTRUCTION     CESAREAN SECTION     ESOPHAGOGASTRODUODENOSCOPY (EGD) WITH PROPOFOL  N/A 11/05/2021   Procedure: ESOPHAGOGASTRODUODENOSCOPY (EGD) WITH PROPOFOL ;  Surgeon: Elois Hair, MD;  Location: [facility] ENDOSCOPY;  Service: Gastroenterology;  Laterality: N/A;   IR CT HEAD LTD  09/24/2022   IR PERCUTANEOUS ART THROMBECTOMY/INFUSION INTRACRANIAL INC DIAG ANGIO  09/24/2022   RADIOLOGY WITH ANESTHESIA N/A 09/24/2022   Procedure: IR WITH ANESTHESIA;  Surgeon: Radiologist, Medication, MD;  Location: [facility] OR;  Service: Radiology;  Laterality: N/A;   RIGHT/LEFT HEART CATH AND CORONARY ANGIOGRAPHY N/A 03/29/2019   Procedure: RIGHT/LEFT HEART CATH AND CORONARY ANGIOGRAPHY;  Surgeon: Darlis Eisenmenger, MD;  Location: [facility] INVASIVE CV LAB;  Service: Cardiovascular;  Laterality: N/A;   RIGHT/LEFT HEART CATH AND CORONARY ANGIOGRAPHY N/A 09/20/2021   Procedure: RIGHT/LEFT HEART CATH AND CORONARY ANGIOGRAPHY;  Surgeon: Darlis Eisenmenger, MD;  Location: [facility] INVASIVE CV LAB;  Service: Cardiovascular;  Laterality: N/A;   Past Medical History:  Diagnosis Date   Abnormal Pap smear    Acute combined systolic and diastolic heart failure (HCC) 02/19/2019   Wt Readings from Last 3 Encounters:  09/18/21  84.7 kg  09/02/21  84.4 kg  08/31/21  84.2 kg         Acute heart failure (HCC) 01/29/2022   Acute on chronic combined systolic and diastolic CHF (congestive heart failure) (HCC) 02/19/2019   Acute on chronic congestive heart  failure (HCC)    Acute on chronic congestive heart failure (HCC)    Acute on chronic diastolic CHF (congestive heart failure) (HCC) 10/19/2021   Acute on chronic systolic heart failure (HCC) 02/22/2022   Anemia    Asthma    Atrial fibrillation (HCC)    history of paroxysmal atrial fibrillation   CHF (congestive heart failure) (HCC) 11/02/2021   CHF exacerbation (HCC) 10/17/2021   Diabetes mellitus    DKA, type 2 (HCC) 02/06/2019   Elevated troponin 09/30/2019   Heart failure with reduced ejection fraction (HCC)    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  Consider chronic etiology given continued fertility issues   Hypertension    Hypertensive emergency 09/30/2019   Hypertensive urgency 06/02/2022   Major depressive disorder 02/08/2012   Reports that her infertility is a major cause of her depression Reports Prozac   has worked in the past for her.     Migraines    Morbid obesity (HCC) 02/06/2019   Nausea & vomiting 06/26/2022   Nonischemic cardiomyopathy (HCC) 09/22/2021   NSTEMI (non-ST elevated myocardial infarction) (HCC) 02/23/2022   Obstructive sleep apnea 06/30/2015   Polycystic ovarian disease    There were no vitals taken for this visit.  Opioid Risk Score:   Fall Risk Score:  `1  Depression screen PHQ 2/9     11/01/2023    1:11 PM 07/27/2023    2:59 PM 07/04/2023   10:56 AM 04/04/2023   10:32 AM 02/21/2023   10:45 AM 08/05/2022    9:39 AM 01/19/2022    3:07 PM  Depression screen PHQ 2/9  Decreased Interest 2 3 1 3 2 3 1   Down, Depressed, Hopeless 0 3 1 3 2 3 2   PHQ - 2 Score 2 6 2 6 4 6 3   Altered sleeping 3 3  3  0 3 3  Tired, decreased energy 2 3  3 1 3 2   Change in appetite 0 0  0 0 3 2  Feeling bad or failure about yourself  1 2  2 3  2   Trouble concentrating 0 2  2 2  2   Moving slowly or fidgety/restless 0 2  0 0  0  Suicidal thoughts 0 0  0 1 0 1  PHQ-9 Score 8 18  16 11 15 15   Difficult  doing work/chores  Very difficult  Very difficult       Review of Systems     Objective:   Physical Exam        Assessment & Plan:

## 2023-11-29 ENCOUNTER — Encounter: Payer: Medicare Other | Attending: Physical Medicine and Rehabilitation | Admitting: Registered Nurse

## 2023-11-29 ENCOUNTER — Telehealth: Payer: Self-pay | Admitting: Licensed Clinical Social Worker

## 2023-11-29 DIAGNOSIS — G629 Polyneuropathy, unspecified: Secondary | ICD-10-CM | POA: Insufficient documentation

## 2023-11-29 DIAGNOSIS — I152 Hypertension secondary to endocrine disorders: Secondary | ICD-10-CM | POA: Insufficient documentation

## 2023-11-29 DIAGNOSIS — G8929 Other chronic pain: Secondary | ICD-10-CM | POA: Insufficient documentation

## 2023-11-29 DIAGNOSIS — I10 Essential (primary) hypertension: Secondary | ICD-10-CM | POA: Insufficient documentation

## 2023-11-29 DIAGNOSIS — M25512 Pain in left shoulder: Secondary | ICD-10-CM | POA: Insufficient documentation

## 2023-11-29 DIAGNOSIS — Z8673 Personal history of transient ischemic attack (TIA), and cerebral infarction without residual deficits: Secondary | ICD-10-CM | POA: Insufficient documentation

## 2023-11-29 DIAGNOSIS — E1159 Type 2 diabetes mellitus with other circulatory complications: Secondary | ICD-10-CM | POA: Insufficient documentation

## 2023-11-30 ENCOUNTER — Encounter: Payer: Medicare Other | Admitting: Physical Medicine and Rehabilitation

## 2023-12-04 ENCOUNTER — Other Ambulatory Visit (HOSPITAL_COMMUNITY): Payer: Self-pay

## 2023-12-04 ENCOUNTER — Other Ambulatory Visit: Payer: Self-pay | Admitting: Student

## 2023-12-04 DIAGNOSIS — M79605 Pain in left leg: Secondary | ICD-10-CM

## 2023-12-04 DIAGNOSIS — F5101 Primary insomnia: Secondary | ICD-10-CM

## 2023-12-04 MED ORDER — GABAPENTIN 100 MG PO CAPS
100.0000 mg | ORAL_CAPSULE | Freq: Every day | ORAL | 1 refills | Status: DC
  Filled 2023-12-04: qty 30, 30d supply, fill #0

## 2023-12-04 MED ORDER — TRAZODONE HCL 150 MG PO TABS
150.0000 mg | ORAL_TABLET | Freq: Every evening | ORAL | 0 refills | Status: DC
  Filled 2023-12-04: qty 30, 30d supply, fill #0

## 2023-12-05 ENCOUNTER — Other Ambulatory Visit (HOSPITAL_COMMUNITY): Payer: Self-pay

## 2023-12-15 ENCOUNTER — Other Ambulatory Visit (HOSPITAL_COMMUNITY): Payer: Self-pay

## 2023-12-27 ENCOUNTER — Ambulatory Visit: Admitting: Student

## 2023-12-27 ENCOUNTER — Other Ambulatory Visit: Payer: Self-pay | Admitting: Licensed Clinical Social Worker

## 2023-12-27 NOTE — Patient Outreach (Signed)
 Complex Care Management   Visit Note  12/27/2023  Name:  [name] MRN: [id number] DOB: 03-19-1976  Situation: Referral received for Complex Care Management related to Mental/Behavioral Health diagnosis MDD I obtained verbal consent from Patient.  Visit completed with pt  on the phone  Background:   Past Medical History:  Diagnosis Date   Abnormal Pap smear    Acute combined systolic and diastolic heart failure (HCC) 02/19/2019   Wt Readings from Last 3 Encounters:  09/18/21  84.7 kg  09/02/21  84.4 kg  08/31/21  84.2 kg         Acute heart failure (HCC) 01/29/2022   Acute on chronic combined systolic and diastolic CHF (congestive heart failure) (HCC) 02/19/2019   Acute on chronic congestive heart failure (HCC)    Acute on chronic congestive heart failure (HCC)    Acute on chronic diastolic CHF (congestive heart failure) (HCC) 10/19/2021   Acute on chronic systolic heart failure (HCC) 02/22/2022   Anemia    Asthma    Atrial fibrillation (HCC)    history of paroxysmal atrial fibrillation   CHF (congestive heart failure) (HCC) 11/02/2021   CHF exacerbation (HCC) 10/17/2021   Diabetes mellitus    DKA, type 2 (HCC) 02/06/2019   Elevated troponin 09/30/2019   Heart failure with reduced ejection fraction (HCC)    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  Consider chronic etiology given continued fertility issues   Hypertension    Hypertensive emergency 09/30/2019   Hypertensive urgency 06/02/2022   Major depressive disorder 02/08/2012   Reports that her infertility is a major cause of her depression Reports Prozac  has worked in the past for her.     Migraines    Morbid obesity (HCC) 02/06/2019   Nausea & vomiting 06/26/2022   Nonischemic cardiomyopathy (HCC) 09/22/2021   NSTEMI (non-ST elevated myocardial infarction) (HCC) 02/23/2022   Obstructive sleep apnea 06/30/2015   Polycystic ovarian disease      Assessment: Patient Reported Symptoms:  Cognitive Cognitive Status: Alert and oriented to person, place, and time, Normal speech and language skills Cognitive/Intellectual Conditions Management [RPT]: None reported or documented in medical history or problem list      Neurological Neurological Review of Symptoms: No symptoms reported Neurological Conditions: Seizures, Stroke, hemorrhagic Neurological Management Strategies: Medication therapy, Coping strategies, Routine screening  HEENT HEENT Symptoms Reported: No symptoms reported      Cardiovascular Cardiovascular Symptoms Reported: No symptoms reported Does patient have uncontrolled Hypertension?: No Cardiovascular Conditions: Hypertension, Heart failure Cardiovascular Management Strategies: Coping strategies, Routine screening  Respiratory Respiratory Symptoms Reported: No symptoms reported    Endocrine Patient reports the following symptoms related to hypoglycemia or hyperglycemia : No symptoms reported Is patient diabetic?: Yes Is patient checking blood sugars at home?: No Endocrine Conditions: Diabetes Endocrine Management Strategies: Routine screening, Medication therapy  Gastrointestinal Gastrointestinal Symptoms Reported: No symptoms reported      Genitourinary Genitourinary Symptoms Reported: No symptoms reported    Integumentary Integumentary Symptoms Reported: No symptoms reported    Musculoskeletal Musculoskelatal Symptoms Reviewed: Muscle pain, Difficulty walking, Unsteady gait Additional Musculoskeletal Details: Patient reports ongoing back/arm pain. Will address with PCP at upcoming appt and request a power wheelchair Musculoskeletal Conditions: Back pain, Unsteady gait Musculoskeletal Management Strategies: Routine screening   Patient at Risk for Falls Due to: Impaired mobility  Psychosocial Psychosocial Symptoms Reported: Depression - if selected complete PHQ 2-9 Additional Psychological Details: Pt  reports depression symptoms are triggered by dependence on others to assist with ADL's. Validation and encouragement provided. LCSW discussed various strategies to assist with management of symptoms. Pt is not decisive about initiating psychiatry or counseling. Pt agreed to inform PCP at upcoming appt Behavioral Health Conditions: Depression Behavioral Management Strategies: Coping strategies, Support system Behavioral Health Self-Management Outcome: 3 (uncertain) Major Change/Loss/Stressor/Fears (CP): Medical condition, self Techniques to Cope with Loss/Stress/Change: Diversional activities Quality of Family Relationships: helpful Do you feel physically threatened by others?: No      12/27/2023    3:55 PM  Depression screen PHQ 2/9  Decreased Interest 3  Down, Depressed, Hopeless 2  PHQ - 2 Score 5  Altered sleeping 0  Tired, decreased energy 1  Change in appetite 0  Feeling bad or failure about yourself  1  Trouble concentrating 0  Moving slowly or fidgety/restless 0  Suicidal thoughts --  PHQ-9 Score 7    There were no vitals filed for this visit.  Medications Reviewed Today     Reviewed by Adriana Albany, LCSW (Social Worker) on 12/27/23 at 1550  Med List Status: <None>   Medication Order Taking? Sig Documenting Provider Last Dose Status Informant  Accu-Chek Softclix Lancets lancets [id number] No USE 1  TO CHECK GLUCOSE 4 TIMES DAILY  Patient not taking: Reported on 11/01/2023   Marveen Slick, MD Not Taking Active Father           Med Note Harles Lied, Nathanel Tallman D   Wed Nov 01, 2023  1:20 PM) Have to pay out of pocket fees   Accu-Chek Softclix Lancets lancets [id number] No Use each in the morning, at noon, and at bedtime.  Patient not taking: Reported on 12/27/2023   Liam Redhead, MD Not Taking Active   acetaminophen  (TYLENOL ) 500 MG tablet [id number] No Take 500-1,000 mg by mouth every 6 (six) hours as needed for mild pain or headache (OR CRAMPING).  Patient not taking:  Reported on 11/01/2023   [provider] Not Taking Active Father  acetaminophen -codeine  (TYLENOL  #3) 300-30 MG tablet [id number] Yes Take 1 tablet by mouth 2 (two) times daily as needed for moderate pain (pain score 4-6). Liam Redhead, MD Taking Active   albuterol  (VENTOLIN  HFA) 108 (90 Base) MCG/ACT inhaler [id number] Yes Inhale 2 puffs into the lungs every 6 (six) hours as needed for wheezing or shortness of breath. Genora Kidd, MD Taking Active   atorvastatin  (LIPITOR ) 80 MG tablet [id number] Yes Take 1 tablet (80 mg total) by mouth daily after supper. McDiarmid, Demetra Filter, MD Taking Active   bisacodyl  5 MG EC tablet [id number] Yes Take 2 tablets (10 mg total) by mouth every 24 hours as needed for constipation.  Taking Active            Med Note (Makhai Fulco D   Wed Nov 01, 2023  1:22 PM) Need a refill   blood glucose meter kit and supplies [id number] No Use up to 4 (four) times daily as directed  Patient not taking: Reported on 12/27/2023   Liam Redhead, MD Not Taking Active   Blood Glucose Monitoring Suppl (ACCU-CHEK GUIDE) w/Device KIT [id number] Yes Use up to 4 (four) times daily. Liam Redhead, MD Taking Active     Discontinued 07/01/22 [phone]          Med Note>> Louanna Rouse, [facility]   07/01/2022  9:52 AM changed to Toprol  XL    Cholecalciferol  (VITAMIN D ) 125 MCG (5000 UT) CAPS  [id number] Yes Take one capsule by mouth once daily. McDiarmid, Demetra Filter, MD Taking Active   cyclobenzaprine  (FLEXERIL ) 5 MG tablet [id number] Yes Take 1 tablet (5 mg total) by mouth 3 (three) times daily as needed for muscle spasms. Genora Kidd, MD Taking Active   dabigatran  (PRADAXA ) 150 MG CAPS capsule [id number]  Take 1 capsule (150 mg total) by mouth 2 (two) times daily for anticoagulant. Genora Kidd, MD  Active   empagliflozin  (JARDIANCE ) 25 MG TABS tablet [id number] Yes Take 1 tablet (25 mg total) by mouth daily. McDiarmid, Demetra Filter, MD Taking Active   escitalopram  (LEXAPRO ) 20 MG  tablet [id number] Yes Take 1 tablet (20 mg total) by mouth at bedtime. Genora Kidd, MD Taking Active   gabapentin  (NEURONTIN ) 100 MG capsule [id number] Yes Take 1 capsule (100 mg total) by mouth at bedtime. Genora Kidd, MD Taking Active   glipiZIDE  (GLUCOTROL ) 5 MG tablet [id number] Yes Take 1 tablet (5 mg total) by mouth daily before breakfast. McDiarmid, Demetra Filter, MD Taking Active   glucose blood (ACCU-CHEK GUIDE) test strip [id number] No USE  4 TIMES DAILY  Patient not taking: Reported on 12/27/2023   Ganta, Anupa, DO Not Taking Active Father  hydrOXYzine  (ATARAX ) 10 MG tablet [id number] Yes Take 1 tablet (10 mg total) by mouth every 8 (eight) hours as needed for itching.  Taking Active   hydrOXYzine  (ATARAX ) 10 MG tablet [id number] Yes Take 1 tablet (10 mg total) by mouth 3 (three) times daily as needed for anxiety. Genora Kidd, MD Taking Active   levETIRAcetam  (KEPPRA ) 750 MG tablet [id number] Yes Take 1 tablet (750 mg total) by mouth 2 (two) times daily for seizures.  Taking Active   lidocaine  (LIDODERM ) 5 % [id number] Yes Place 1 patch onto the skin daily. Remove & Discard patch within 12 hours or as directed by MD Raulkar, Keven Pel, MD Taking Active   losartan  (COZAAR ) 50 MG tablet [id number] Yes Take 1 tablet (50 mg total) by mouth daily. McDiarmid, Demetra Filter, MD Taking Active   melatonin 5 MG TABS [id number] Yes Take 1 tablet (5 mg total) by mouth at bedtime. McDiarmid, Demetra Filter, MD Taking Active   metoprolol  tartrate (LOPRESSOR ) 25 MG tablet [id number] Yes Take 1 tablet (25 mg total) by mouth 2 (two) times daily. McDiarmid, Demetra Filter, MD Taking Active   spironolactone  (ALDACTONE ) 25 MG tablet [id number] Yes Take 1 tablet (25 mg total) by mouth daily. McDiarmid, Demetra Filter, MD Taking Active   torsemide  (DEMADEX ) 20 MG tablet [id number] Yes Take 1 tablet (20 mg total) by mouth daily. Genora Kidd, MD Taking Active Father  traZODone  (DESYREL ) 150 MG tablet [id number] Yes Take 1 tablet (150 mg total) by  mouth at bedtime for insomnia. Genora Kidd, MD Taking Active   Ubrogepant  (UBRELVY ) 100 MG TABS [id number] Yes Take 1 tablet (100 mg total) by mouth daily as needed. Ellene Gustin, MD Taking Active             Recommendation:   PCP Follow-up Continue Current Plan of Care  Follow Up Plan:   Telephone follow-up in 1 month  Alease Hunter, LCSW [facility]  [facility], [facility] Clinical Social Worker Direct Dial: [phone]  Fax: [phone] Website: [email or web address] 4:24 PM

## 2023-12-27 NOTE — Patient Instructions (Signed)
 Visit Information  Thank you for taking time to visit with me today. Please don't hesitate to contact me if I can be of assistance to you before our next scheduled appointment.  Our next appointment is by telephone on 07/2 at 3:30 PM Please call the care guide team at [phone] if you need to cancel or reschedule your appointment.   Following is a copy of your care plan:   Goals Addressed             This Visit's Progress    VBCI Social Work Care Plan   On track    Problems:   Disease Management support and education needs related to Depression: depressed mood  CSW Clinical Goal(s):   Over the next 90 days the Patient will attend all scheduled medical appointments as evidenced by patient report and care team review of appointment completion in EMR:   demonstrate a reduction in symptoms related to Depression: depressed mood .  Interventions:  Mental Health:  Evaluation of current treatment plan related to Depression: depressed mood Active listening / Reflection utilized Depression screen reviewed Motivational Interviewing employed Participation in counseling encourage : Patient was provided info on local agencies, unsure if she would like to initiate services PHQ2/PHQ9 completed Provided general psycho-education for mental health needs Reviewed mental health medications and discussed importance of compliance: Patient will f/up with PCP for refills; has upcoming appt scheduled for 06/19  Solution-Focued Strategies employed: LCSW discussed various options, including, counseling and medication management to assist with symptom management. Patient agreed to think options over and inform PCP at upcoming appt scheduled for 06/19  LCSW discussed crisis intervention resources with patient Patient did not endorse SI/HI  Patient Goals/Self-Care Activities:  Continue taking your medication as prescribed.   Increase coping skills and healthy habits  Plan:   Telephone follow up  appointment with care management team member scheduled for:  2-4 weeks        Please call the Suicide and Crisis Lifeline: 988 go to [facility] [address] [place] [phone]) call 911 if you are experiencing a Mental Health or Behavioral Health Crisis or need someone to talk to.  Patient verbalizes understanding of instructions and care plan provided today and agrees to view in MyChart. Active MyChart status and patient understanding of how to access instructions and care plan via MyChart confirmed with patient.     Arlis Bent [facility]  [facility], [facility] Clinical Social Worker Direct Dial: [phone]  Fax: [phone] Website: [email or web address] 4:24 PM

## 2024-01-02 ENCOUNTER — Encounter: Payer: Self-pay | Admitting: *Deleted

## 2024-01-11 ENCOUNTER — Telehealth: Payer: Self-pay

## 2024-01-11 ENCOUNTER — Ambulatory Visit: Admitting: Student

## 2024-01-11 ENCOUNTER — Other Ambulatory Visit (HOSPITAL_COMMUNITY)
Admission: RE | Admit: 2024-01-11 | Discharge: 2024-01-11 | Disposition: A | Discharge status: Home / Self Care | Source: Ambulatory Visit | Attending: Family Medicine | Admitting: Family Medicine

## 2024-01-11 ENCOUNTER — Encounter: Payer: Self-pay | Admitting: Student

## 2024-01-11 ENCOUNTER — Other Ambulatory Visit (HOSPITAL_COMMUNITY): Payer: Self-pay

## 2024-01-11 VITALS — BP 137/95 | HR 75 | Ht 59.0 in | Wt 131.0 lb

## 2024-01-11 DIAGNOSIS — I152 Hypertension secondary to endocrine disorders: Secondary | ICD-10-CM

## 2024-01-11 DIAGNOSIS — M7989 Other specified soft tissue disorders: Secondary | ICD-10-CM | POA: Diagnosis not present

## 2024-01-11 DIAGNOSIS — E1159 Type 2 diabetes mellitus with other circulatory complications: Secondary | ICD-10-CM

## 2024-01-11 DIAGNOSIS — M79605 Pain in left leg: Secondary | ICD-10-CM | POA: Diagnosis not present

## 2024-01-11 DIAGNOSIS — Z124 Encounter for screening for malignant neoplasm of cervix: Secondary | ICD-10-CM | POA: Diagnosis present

## 2024-01-11 DIAGNOSIS — F5101 Primary insomnia: Secondary | ICD-10-CM

## 2024-01-11 DIAGNOSIS — Z1151 Encounter for screening for human papillomavirus (HPV): Secondary | ICD-10-CM | POA: Diagnosis not present

## 2024-01-11 DIAGNOSIS — I63411 Cerebral infarction due to embolism of right middle cerebral artery: Secondary | ICD-10-CM

## 2024-01-11 DIAGNOSIS — Z01419 Encounter for gynecological examination (general) (routine) without abnormal findings: Secondary | ICD-10-CM | POA: Diagnosis present

## 2024-01-11 MED ORDER — TRAZODONE HCL 150 MG PO TABS
150.0000 mg | ORAL_TABLET | Freq: Every evening | ORAL | 0 refills | Status: DC
  Filled 2024-01-11: qty 30, 30d supply, fill #0

## 2024-01-11 MED ORDER — TIZANIDINE HCL 4 MG PO TABS
4.0000 mg | ORAL_TABLET | Freq: Four times a day (QID) | ORAL | 0 refills | Status: DC | PRN
  Filled 2024-01-11: qty 30, 8d supply, fill #0

## 2024-01-11 MED ORDER — LOSARTAN POTASSIUM 50 MG PO TABS
50.0000 mg | ORAL_TABLET | Freq: Every day | ORAL | 5 refills | Status: DC
  Filled 2024-01-11: qty 30, 30d supply, fill #0
  Filled 2024-05-09: qty 30, 30d supply, fill #1

## 2024-01-11 MED ORDER — GABAPENTIN 300 MG PO CAPS
300.0000 mg | ORAL_CAPSULE | Freq: Every day | ORAL | 0 refills | Status: DC
  Filled 2024-01-11: qty 30, 30d supply, fill #0

## 2024-01-11 MED ORDER — MELATONIN 5 MG PO TABS
5.0000 mg | ORAL_TABLET | Freq: Every day | ORAL | 5 refills | Status: DC
  Filled 2024-01-11: qty 30, 30d supply, fill #0
  Filled 2024-02-06: qty 30, 30d supply, fill #1
  Filled 2024-03-19: qty 30, 30d supply, fill #2
  Filled 2024-04-15: qty 30, 30d supply, fill #3
  Filled 2024-05-20: qty 30, 30d supply, fill #4
  Filled 2024-06-17: qty 30, 30d supply, fill #5

## 2024-01-11 NOTE — Assessment & Plan Note (Signed)
 Patient's blood pressure is not controlled today. BP: (!) 137/95. Goal of 130/80. Patient's medication regimen includes losartan  50, metoprolol  25 twice daily, spironolactone  25. -Changes to current regimen include none, bring in meds in 2 weeks -Follow up in 2 weeks

## 2024-01-11 NOTE — Progress Notes (Signed)
    SUBJECTIVE:   CHIEF COMPLAINT / HPI: Obtain pap smear  Pap Smear: Last pap in 2016 inconclusive  Patient is a 48 y.o. female presenting for pap smear.  She states she does not have any discharge, odor or vaginal symptosm.  She is not  interested in screening for sexually transmitted infections today.  Discussed the use of AI scribe software for clinical note transcription with the patient, who gave verbal consent to proceed.  History of Present Illness AARICA WAX is a 48 year old female with post-stroke pain and hypertension who presents for medication management and pain control.  She experiences persistent pain in her left leg and arm following a stroke. Her current pain management regimen is ineffective, and she is dissatisfied with the treatment provided at a pain clinic. She was previously on oxycodone  but has been switched to Tylenol , which she finds ineffective.  She is currently taking losartan  50 mg for hypertension, metoprolol  for heart rate control, and Jardiance . She has not taken her blood pressure medication today. She also takes gabapentin , trazodone , melatonin, and spironolactone , although she is unsure about her adherence to spironolactone . She needs refills for gabapentin , trazodone , and melatonin.   PERTINENT  PMH / PSH: CVA  OBJECTIVE:   BP (!) 137/95   Pulse 75   Ht 4' 11 (1.499 m)   Wt 131 lb (59.4 kg)   SpO2 99%   BMI 26.46 kg/m    General: NAD, pleasant, able to participate in exam Respiratory: Normal effort, no obvious respiratory distress Pelvic: VULVA: normal appearing vulva with no masses, tenderness or lesions, VAGINA: Normal appearing vagina with normal color, no lesions, with scant discharge present, very difficult examination unable to completely visualize cervix due to positioning of patient from prior CVA and discomfort with examination  Chaperone Tashira Legette CMA present for pelvic exam  ASSESSMENT/PLAN:   Assessment &  Plan Screening for cervical cancer 48 y.o. female here for pap smear-essentialy blind pap due to difficulty of positioning.  Physical exam significant for scant discharge.  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 She feels current acetaminophen -codeine  regimen inadequate. I discussed I do not feel comfortable prescribing long-term opioids for her. - Refer to a different pain management clinic per patient request - Prescribe tizanidine as a muscle relaxant. - Increase gabapentin  to 300 mg at nighttime. Hypertension associated with diabetes (HCC) Patient's blood pressure is not controlled today. BP: (!) 137/95. Goal of 130/80. Patient's medication regimen includes losartan  50, metoprolol  25 twice daily, spironolactone  25. 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 Follow-up imaging required. - Schedule repeat CT Chest      Genora Kidd, MD [facility]

## 2024-01-11 NOTE — Patient Instructions (Addendum)
 It was great to see you! Thank you for allowing me to participate in your care!   Our plans for today:  - We will see what your pap smear shows, this may have to be re-done with OBGYN - I can place another referral to pain medicine if you would like - You need a repeat CT chest for follow up of your thymus - 2 week follow up for blood pressue - bring ALL of your meds with you to this visit  Take care and seek immediate care sooner if you develop any concerns.  Genora Kidd, MD

## 2024-01-11 NOTE — Telephone Encounter (Signed)
 Spoke with patient, informed of CT at [facility] July 3rd at 2:15. Patient understood. Linnie Riches, CMA

## 2024-01-16 ENCOUNTER — Ambulatory Visit: Payer: Self-pay | Admitting: Student

## 2024-01-16 LAB — CYTOLOGY - PAP
Adequacy: ABSENT
Comment: NEGATIVE
Diagnosis: REACTIVE
High risk HPV: NEGATIVE

## 2024-01-19 NOTE — Progress Notes (Signed)
 NEUROLOGY FOLLOW UP OFFICE NOTE  [name] [id number]  Subjective:  [name] is a 48 y.o. year old right-handed female with a medical history of HTN, HLD, DM, stroke, HFrEF, afib, CAD, OSA, migraines who we last saw on 08/03/23 for stroke.  To briefly review: 08/03/23: Patient had acute onset left sided weakness on 09/24/22 while admitted for SOB 2/2 CHF. She had LV thrombus as well. Per H&P from 09/24/22: This is a 48 year old patient admitted with shortness of breath secondary to acute combined CHF exacerbation and also found to have an LV thrombus on heparin  drip until this morning on whom neurology is consulted for stroke code for left-sided weakness.  Other acute medical problems include acute on chronic combined systolic and diastolic heart failure, elevated troponin (elevated to 197 but trended down to 170, no EKG changes, felt to be likely demand ischemia secondary to CHF exacerbation), poorly controlled diabetes type 2 with last A1c 10.8, paroxysmal A-fib noncompliant with warfarin as an outpatient, hypertension.  Last known well was 12:40 PM, after that time patient developed left-sided weakness and right gaze deviation.  Rapid and then stroke code were activated.  CT head showed loss of gray-white differentiation in the right insula likely representing early infarct with an aspects of 9.  Patient was on heparin  drip until this morning for LV thrombus which was shut off at the time the stroke code was called.  Therefore she was not a candidate for TNK.  NIH stroke scale was 16.  Patient has no deficits at baseline.  CT angiogram showed a proximal right M2 occlusion.    TICI2B revascularization was achieved. Per D/C summary from 10/01/22: She had a brief cardiac arrest during the procedure but was resuscitated after 2 minutes. She was initially on heparin  drip but now has been changed to Eliquis , which she will need to take indefinitely for cardiac thrombus and atrial fibrillation. She  is doing well and is ready to be discharged to rehabilitation.  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  Aspects is 9 CTA head & neck:  Proximal right M2 occlusion with minimal collateral flow throughout the MCA distribution. IR - Occluded inferior division of the right middle cerebral artery in the mid M2 segment. TICI 2B revascularization.  Post IR CT: no evidence of intracranial hemorrhage.  MRI: Large area of acute infarct in the right MCA distribution, smaller area of acute infarct in the right ACA distribution, and punctate acute infarct in the left cerebellar hemisphere. No evidence of hemorrhagic transformation or midline shift. MRA head - Persistent occlusion of a right M2 branch with minimal collateral flow in the MCA distribution aside from in the region of the frontal operculum. CT repeat 3/4 stable right superior division MCA territory and distal right ACA territory infarcts with petechial hemorrhage. 2D Echo: Large LV apical thrombus present, LVEF 15% LDL 97 HgbA1c 10.8 VTE prophylaxis - SCDs, Eliquis  warfarin daily prior to admission (?compliance), now on eliquis  5mg  BID (heparin  off 3/8)    Neuro exam on discharge per D/C summary: NEURO:  Mental Status: AA&Ox3  Speech/Language: speech is with mild dysarthria   Cranial Nerves:  II: PERRL.  III, IV, VI: Right gaze preference but can cross midline barely V: Sensation is intact to light  touch and diminished on the left VII: Slight left facial droop VIII: hearing intact to voice. IX, X: Voice is quiet and slightly dysarthric XII: tongue is midline without fasciculations. Motor: 5/5 strength to right upper and lower extremities, 0 out of 5 strength to left upper extremity, 1 out of 5 strength to  left lower extremity Tone: is normal and bulk is normal Sensation- Intact to light touch bilaterally but diminished on the left Gait- deferred   Patient went to inpatient rehab from 10/01/22 to 11/01/22. Per d/c summary from 11/01/22: Hospital Course: [name] was admitted to rehab 10/01/2022 for inpatient therapies to consist of PT, ST and OT at least three hours five days a week. Past admission physiatrist, therapy team and rehab RN have worked together to provide customized collaborative inpatient rehab. She continues on Eliquis  and follow up CBC shows H/H to be trending up due likely due to hemoconcentration. Her blood pressures were monitored on TID basis and has been stable. Serial labs were relatively stable till 04/08 when she was noted to have rise in BUN/SCr to 25/1.62. Her LOS was extended by a day for gentle hydration with IVF with follow up labs showing improvement in SCr to 1.42. Demadex , Spironolactone  and Jardiance  were held for 24 hours and recommend repeat BMET on follow up cardiology appointment the next day.  Vitamin D  supplement was added due to vitamin D  insufficiency.    She continues to report pain left side with shooting pains in arm and leg requiring use of MS IR as needed.  She also reports ongoing issues with spasms at night affecting overall sleep hygiene.  BLE dopplers done on 03/16 due to reports of BLE pain and were negative for DVT. Robaxin  has been used to help manage spasms and melatonin was added to help with sleep hygiene.  She was noted to have depressed mood therefore Lexapro  added for mood stabilization.  Team has been providing ego support as well as encouragement during his stay.  P.o. intake has varied due to dislike of hospital food and megace  was added to help stimulate appetite.  Family has provided additional food from home to help with intake.  Patient's blood sugars were monitored on ACHS basis and glipizide  was added to help with blood sugar control.  Bowel  program has been augmented to help manage constipation.   Amantadine  was added for activation and to help with attention to tasks. She has developed increased tone due to left hemiplegia has been managed with splinting, range of motion as well as local measures including K-pad and Sportscreme.  She has been advised to continue using her resting splints at night. She continues to be limited by left sided weakness with sensory deficits. Family has been educated on managing patient at wheelchair level. Patient has been educated on safe swallow strategies as well as utilization of external and internal aids to assist with memory and recall. She will continue to receive follow up HHPT, HHOT, HHST and HHRN by [facility] after discharge.     Rehab course: During patient's stay in rehab weekly team conferences were held to monitor patient's progress, set goals and discuss barriers to discharge. At admission, patient required mod to max assist with ADL tasks and max assist with mobility. She exhibited mild dysarthria with decreased in vocal intenstiy and needed modified diet due to mild bolus holding with pocketing and decrease in bolus formation.  She  has had improvement in activity tolerance, balance, postural control as  well as ability to compensate for deficits. She requires min assist for bathing tasks and for upper body dressing.  She requires mod assist for lower body dressing.  She requires min assist for transfers with tactile cues for sequencing with, weight shifting and for initiation.  She is able to ambulate 50 feet with max assist and use of a walker.  She continues to require supervision to min assist for cognition l.  She requires supervision at meals and has refused trials of regular textures.  She requested continuing dysphagia 3 diet and was discharged on this per her preference.   Family education has been completed regarding all aspects of physical and cognitive assistance needed after  discharge.  Family was also advised to manage and administer medications at discharge.   Disposition: Home.   Patient continues to get physical therapy and speech therapy in her home. She is not sure she is still making progress.    She has left sided weakness. She has difficulty with memory. She also has a lot of headaches. It is in the back or all over her head. She rates the pain 9/10. It is a throbbing pain that she calls a migraine. It can last 3 days. She had rare migraines prior to the stroke. This occurs a few times per month. There is associated dizziness and nausea.  She has photophobia and phonophobia as well. She will take Goody's headache powder that helps sometimes. She was given meclizine  that helps with the dizziness part of the migraine.   She is no longer on eliquis  but was switched to Dabigatran  a few months ago. She continues to take atorvastatin  90 mg daily.   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 She is on Keppra  750 mg BID. She has not had any seizures since being on Keppra .   She is a never smoker.   Patient has OSA but lost her CPAP. Per Dad, she wasn't using it when she had it. She does not have a sleep medicine doctor.   Of note, CTA chest/abd/pelvis at ED on 07/21/23 showed anterior mediastinum soft tissue density. PCP is following with a repeat CT chest in 6-8 weeks.  Most recent Assessment and Plan (08/03/23): [name] is a 48 y.o. female who presents for evaluation of right MCA stroke 2/2 LV thrombus from heart failure c/b seizures. She has a relevant medical history of HTN, HLD, DM, stroke, HFrEF, afib, CAD, OSA, migraines. 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 Available diagnostic data is significant for MRI brain showing large right ACA and MCA infarct. Patient's  recovery has been complicated by seizures, though I do not have those records as it happened in [place] . There has been no further concern for seizure since starting Keppra . The etiology of her stroke was likely the LV thrombus found during admission for decompensated heart failure.   PLAN: -Sleep medicine referral for untreated OSA -Continue Keppra  750 mg BID -Continue dabigatran  150 mg BID -Continue atorvastatin  80 mg daily -Continue PT and speech therapy -For migraines, will order Ubrelvy  for rescue. Will give samples today while awaiting PA. -May consider migraine ppx if headaches increase -May consider botox for spasticity. Can continue flexeril  for now -Discussed stroke warning signs  Since their last visit: Patient has difficulty walking. She feels like her left leg is locking up. She  does not have much independence. She walks with a walker.  She has completed PT and speech therapy. She continues with home exercises.  She never saw sleep medicine. She does not think she snores anymore, so not sure is she has OSA.  She continues to take Keppra  750 mg BID, dabigatran  150 mg BID, and atorvastatin  80 mg daily. She is no longer taking flexeril .  She has no concern for stroke or seizure since last visit.  Patient is having more headaches, almost every day. She has increased stress due to family staying with her, so that is contributing. She has less headaches when she is alone. She will take Ubrelvy  and this will relieve her headaches.  Patient also mentions wanting to drive today.  MEDICATIONS:  Outpatient Encounter Medications as of 01/31/2024  Medication Sig Note   acetaminophen -codeine  (TYLENOL  #3) 300-30 MG tablet Take 1 tablet by mouth 2 (two) times daily as needed for moderate pain (pain score 4-6).    albuterol  (VENTOLIN  HFA) 108 (90 Base) MCG/ACT inhaler Inhale 2 puffs into the lungs every 6 (six) hours as needed for wheezing or shortness of breath.    atorvastatin  (LIPITOR )  80 MG tablet Take 1 tablet (80 mg total) by mouth daily after supper.    bisacodyl  5 MG EC tablet Take 2 tablets (10 mg total) by mouth every 24 hours as needed for constipation. 11/01/2023: Need a refill    Blood Glucose Monitoring Suppl (ACCU-CHEK GUIDE) w/Device KIT Use up to 4 (four) times daily.    Cholecalciferol  (VITAMIN D ) 125 MCG (5000 UT) CAPS Take one capsule by mouth once daily.    dabigatran  (PRADAXA ) 150 MG CAPS capsule Take 1 capsule (150 mg total) by mouth 2 (two) times daily for anticoagulant.    empagliflozin  (JARDIANCE ) 25 MG TABS tablet Take 1 tablet (25 mg total) by mouth daily.    escitalopram  (LEXAPRO ) 20 MG tablet Take 1 tablet (20 mg total) by mouth at bedtime.    gabapentin  (NEURONTIN ) 300 MG capsule Take 1 capsule (300 mg total) by mouth at bedtime.    glipiZIDE  (GLUCOTROL ) 5 MG tablet Take 1 tablet (5 mg total) by mouth daily before breakfast.    hydrOXYzine  (ATARAX ) 10 MG tablet Take 1 tablet (10 mg total) by mouth every 8 (eight) hours as needed for itching.    hydrOXYzine  (ATARAX ) 10 MG tablet Take 1 tablet (10 mg total) by mouth 3 (three) times daily as needed for anxiety.    levETIRAcetam  (KEPPRA ) 750 MG tablet Take 1 tablet (750 mg total) by mouth 2 (two) times daily for seizures.    lidocaine  (LIDODERM ) 5 % Place 1 patch onto the skin daily. Remove & Discard patch within 12 hours or as directed by MD    losartan  (COZAAR ) 50 MG tablet Take 1 tablet (50 mg total) by mouth daily.    melatonin 5 MG TABS Take 1 tablet (5 mg total) by mouth at bedtime.    metoprolol  tartrate (LOPRESSOR ) 25 MG tablet Take 1 tablet (25 mg total) by mouth 2 (two) times daily.    spironolactone  (ALDACTONE ) 25 MG tablet Take 1 tablet (25 mg total) by mouth daily.    tiZANidine  (ZANAFLEX ) 4 MG tablet Take 1 tablet (4 mg total) by mouth every 6 (six) hours as needed for muscle spasms.    torsemide  (DEMADEX ) 20 MG tablet Take 1 tablet (20 mg total) by mouth daily.    traZODone  (DESYREL ) 150 MG  tablet Take 1 tablet (150 mg total) by  mouth at bedtime for insomnia.    Ubrogepant  (UBRELVY ) 100 MG TABS Take 1 tablet (100 mg total) by mouth daily as needed.    [DISCONTINUED] acetaminophen  (TYLENOL ) 500 MG tablet Take 500-1,000 mg by mouth every 6 (six) hours as needed for mild pain or headache (OR CRAMPING).    Accu-Chek Softclix Lancets lancets USE 1  TO CHECK GLUCOSE 4 TIMES DAILY (Patient not taking: Reported on 01/31/2024) 11/01/2023: Have to pay out of pocket fees    Accu-Chek Softclix Lancets lancets Use each in the morning, at noon, and at bedtime. (Patient not taking: Reported on 01/31/2024)    acetaminophen  (TYLENOL ) 500 MG tablet Take 1-2 tablets (500-1,000 mg total) by mouth every 6 (six) hours as needed for mild pain (pain score 1-3) or headache (OR CRAMPING).    blood glucose meter kit and supplies Use up to 4 (four) times daily as directed (Patient not taking: Reported on 01/31/2024)    glucose blood (ACCU-CHEK GUIDE) test strip USE  4 TIMES DAILY (Patient not taking: Reported on 01/31/2024)    [DISCONTINUED] carvedilol  (COREG ) 3.125 MG tablet Take 1 tablet (3.125 mg total) by mouth 2 (two) times daily with a meal. 07/01/2022: changed to Toprol  XL   No facility-administered encounter medications on file as of 01/31/2024.    PAST MEDICAL HISTORY: Past Medical History:  Diagnosis Date   Abnormal Pap smear    Acute combined systolic and diastolic heart failure (HCC) 02/19/2019   Wt Readings from Last 3 Encounters:  09/18/21  84.7 kg  09/02/21  84.4 kg  08/31/21  84.2 kg         Acute heart failure (HCC) 01/29/2022   Acute on chronic combined systolic and diastolic CHF (congestive heart failure) (HCC) 02/19/2019   Acute on chronic congestive heart failure (HCC)    Acute on chronic congestive heart failure (HCC)    Acute on chronic diastolic CHF (congestive heart failure) (HCC) 10/19/2021   Acute on chronic systolic heart failure (HCC) 02/22/2022   Anemia    Asthma    Atrial fibrillation  (HCC)    history of paroxysmal atrial fibrillation   CHF (congestive heart failure) (HCC) 11/02/2021   CHF exacerbation (HCC) 10/17/2021   Diabetes mellitus    DKA, type 2 (HCC) 02/06/2019   Elevated troponin 09/30/2019   Heart failure with reduced ejection fraction (HCC)    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  Consider chronic etiology given continued fertility issues   Hypertension    Hypertensive emergency 09/30/2019   Hypertensive urgency 06/02/2022   Major depressive disorder 02/08/2012   Reports that her infertility is a major cause of her depression Reports Prozac  has worked in the past for her.     Migraines    Morbid obesity (HCC) 02/06/2019   Nausea & vomiting 06/26/2022   Nonischemic cardiomyopathy (HCC) 09/22/2021   NSTEMI (non-ST elevated myocardial infarction) (HCC) 02/23/2022   Obstructive sleep apnea 06/30/2015   Polycystic ovarian disease     PAST SURGICAL HISTORY: Past Surgical History:  Procedure Laterality Date   BIOPSY  11/05/2021   Procedure: BIOPSY;  Surgeon: Stacia Glendia BRAVO, MD;  Location: [facility] ENDOSCOPY;  Service: Gastroenterology;;   CERVIX LESION DESTRUCTION     CESAREAN SECTION     ESOPHAGOGASTRODUODENOSCOPY (EGD) WITH PROPOFOL  N/A 11/05/2021   Procedure: ESOPHAGOGASTRODUODENOSCOPY (EGD) WITH PROPOFOL ;  Surgeon: Stacia Glendia BRAVO, MD;  Location: [facility] ENDOSCOPY;  Service: Gastroenterology;  Laterality: N/A;  IR CT HEAD LTD  09/24/2022   IR PERCUTANEOUS ART THROMBECTOMY/INFUSION INTRACRANIAL INC DIAG ANGIO  09/24/2022   RADIOLOGY WITH ANESTHESIA N/A 09/24/2022   Procedure: IR WITH ANESTHESIA;  Surgeon: Radiologist, Medication, MD;  Location: [facility] OR;  Service: Radiology;  Laterality: N/A;   RIGHT/LEFT HEART CATH AND CORONARY ANGIOGRAPHY N/A 03/29/2019   Procedure: RIGHT/LEFT HEART CATH AND CORONARY ANGIOGRAPHY;  Surgeon: Rolan Ezra RAMAN, MD;  Location: [facility] INVASIVE CV LAB;  Service:  Cardiovascular;  Laterality: N/A;   RIGHT/LEFT HEART CATH AND CORONARY ANGIOGRAPHY N/A 09/20/2021   Procedure: RIGHT/LEFT HEART CATH AND CORONARY ANGIOGRAPHY;  Surgeon: Rolan Ezra RAMAN, MD;  Location: [facility] INVASIVE CV LAB;  Service: Cardiovascular;  Laterality: N/A;    ALLERGIES: Allergies  Allergen Reactions   Fish Allergy Other (See Comments) and Swelling    Can tolerate flounder, perch, and trout   Shellfish Allergy Anaphylaxis, Other (See Comments) and Swelling    Airway involvement including EMS - Has Epi-Pen  Other Reaction(s): Other (See Comments)   Metformin  Diarrhea   Metformin  And Related Diarrhea   Nsaids Other (See Comments)    Per PCP, interferes with daily meds (heart failure)  Other Reaction(s): Other (See Comments)   Trulicity  [Dulaglutide ] Nausea And Vomiting and Other (See Comments)    Multiple episodes of vomiting over multiple days    FAMILY HISTORY: Family History  Problem Relation Age of Onset   Diabetes Mother    Hypertension Mother    Hypertension Father    Diabetes Father     SOCIAL HISTORY: Social History   Tobacco Use   Smoking status: Never   Smokeless tobacco: Never   Tobacco comments:    Never smoke 10/22/21  Vaping Use   Vaping status: Never Used  Substance Use Topics   Alcohol use: No    Alcohol/week: 0.0 standard drinks of alcohol   Drug use: No   Social History   Social History Narrative   Are you right handed or left handed? right   Are you currently employed ?    What is your current occupation?   Do you live at home alone?   Who lives with you?    What type of home do you live in: 1 story or 2 story?           Objective:  Vital Signs:  BP 137/85   Pulse 80   Ht 4' 11 (1.499 m)   Wt 136 lb (61.7 kg)   SpO2 99%   BMI 27.47 kg/m   General: General appearance: Awake and alert. No distress. Cooperative with exam.  Skin: No obvious rash or jaundice. HEENT: Atraumatic. Anicteric. Lungs: Non-labored breathing on  room air  Heart: Regular Extremities: No edema.  Psych: Affect appropriate.  Neurological: Mental Status: Alert. Speech fluent. No pseudobulbar affect Cranial Nerves: CNII: No RAPD. Visual fields intact. CNIII, IV, VI: PERRL. No nystagmus. EOMI. CN V: Facial sensation intact bilaterally to fine touch. CN VII: Facial muscles symmetric and strong. No ptosis at rest. CN VIII: Hears finger rub well bilaterally. CN IX: No hypophonia. CN X: Palate elevates symmetrically. CN XI: Full strength shoulder shrug bilaterally. CN XII: Tongue protrusion full and midline. No atrophy or fasciculations. No significant dysarthria Motor: Tone is spastic in LUE >>> LLE. Atrophy of LUE  Individual muscle group testing (MRC grade out of 5):  Movement     Neck flexion 5    Neck extension 5     Right Left   Shoulder abduction 5  0   Shoulder adduction 5 0   Shoulder ext rotation 5 0   Shoulder int rotation 5 0   Elbow flexion 5 0   Elbow extension 5 0   Wrist extension 5 0   Wrist flexion 5 0   Finger abduction - FDI 5 0   Finger abduction - ADM 5 0   Finger extension 5 0   Finger distal flexion - 2/3 5 0   Finger distal flexion - 4/5 5 0   Thumb flexion - FPL 5 0   Thumb abduction - APB 5 0    Hip flexion 5 4+   Hip extension 5 4+   Hip adduction 5 4+   Hip abduction 5 4+   Knee extension 5 4+   Knee flexion 5 4+   Dorsiflexion 5 0   Plantarflexion 5 2     Reflexes:  Right Left   Bicep 2+ 3+   Tricep 2+ 3+   BrRad 2+ 3+   Knee 2+ 3+   Ankle 2+ 2+    Sensation: Pinprick: Increased in LUE otherwise intact Coordination: Intact finger-to- nose-finger in RUE. Unable to use LUE.  Gait: Steppage gait with ?spastic component to gait. Patient only able to take a couple of steps.   Labs and Imaging review: New results: 08/19/23: CBC unremarkable BMP significant for glucose 170  HbA1c (08/28/23): 5.7  CT head wo contrast (08/20/23): FINDINGS: Brain: Sequelae from old right MCA  and ACA territory infarcts are again noted, unchanged from prior. No mass effect or midline shift. No acute infarcts are seen. No acute intracranial hemorrhage or hydrocephalus. No extra-axial fluid collection.   Vascular: No hyperdense vessel or unexpected calcification.   Skull: Normal. Negative for fracture or focal lesion.   Sinuses/Orbits: No acute finding.   Other: None.   IMPRESSION: 1. No acute intracranial process. 2. Sequelae from old right MCA and ACA territory infarcts, unchanged from prior.  Previously reviewed results: Lab Results  Component Value Date    HGBA1C 7.8 (A) 12/29/2022      Recent Labs       Lab Results  Component Value Date    VITAMINB12 1,470 (H) 06/29/2022      Recent Labs       Lab Results  Component Value Date    TSH 3.269 09/24/2022     04/04/23: CMP significant for Na 132, glucose 154, tbili 2.3, alk phos 151 CBC unremarkable   HbA1c (12/29/22): 7.8   Lipid panel: Component     Latest Ref Rng 09/24/2022  Cholesterol     0 - 200 mg/dL 863   Triglycerides     <150 mg/dL 62   HDL Cholesterol     >40 mg/dL 27 (L)   Total CHOL/HDL Ratio     RATIO 5.0   VLDL     0 - 40 mg/dL 12   LDL (calc)     0 - 99 mg/dL 97       Imaging: CT head wo contrast and CTA head and neck (09/24/22): FINDINGS: CT HEAD FINDINGS   Brain: There is questionable loss of gray-white differentiation in the posterior insula suspicious for early infarct in the MCA distribution. The remainder of the MCA territory appears spared at this time. ASPECTS is 9.   There is no acute intracranial hemorrhage or extra-axial fluid collection.   Background parenchymal volume is normal. The ventricles are normal in size. Probable prominent perivascular spaces are noted in the bilateral basal  ganglia.   The pituitary and suprasellar region are normal. There is no mass lesion. There is no mass effect or midline shift.   Vascular: See below.   Skull: Normal.  Negative for fracture or focal lesion.   Sinuses/Orbits: There is mild mucosal thickening in the paranasal sinuses. There is a rightward gaze deviation.   Other: None.   Review of the MIP images confirms the above findings   CTA NECK FINDINGS   Aortic arch: The imaged aortic arch is normal. The origins of the major branch vessels are patent. The subclavian arteries are patent to the level imaged.   Right carotid system: The right common, internal, and external carotid arteries are patent with mild plaque in the common carotid artery and at the bifurcation but no hemodynamically significant stenosis or occlusion. There is no evidence of dissection or aneurysm.   Left carotid system: The left common, internal, and external carotid arteries are patent with mild plaque at the bifurcation but no hemodynamically significant stenosis or occlusion. There is no evidence of dissection or aneurysm   Vertebral arteries: The vertebral arteries are patent, without hemodynamically significant stenosis or occlusion. There is no evidence of dissection or aneurysm.   Skeleton: There is no acute osseous abnormality or suspicious osseous lesion. There is no visible canal hematoma.   Other neck: The soft tissues of the neck are unremarkable.   Upper chest: There is an incompletely imaged right pleural effusion. The imaged lung apices are otherwise clear.   Review of the MIP images confirms the above findings   CTA HEAD FINDINGS   Anterior circulation: The intracranial ICAs are patent.   The right M1 segment is patent. Small patent MCA branches are seen in the region of the frontal operculum; however, there is essentially no other collateral flow in the remainder of the MCA distribution.   The left M1 segment and distal branches are patent, without proximal stenosis or occlusion.   The left A1 segment is relatively hypoplastic, likely developmental. The dominant right A1 segment is  patent. The distal ACA branches are patent, without proximal stenosis or occlusion.   There is no aneurysm or AVM.   Posterior circulation: The bilateral V4 segments are patent. The basilar artery is patent, diminutive in caliber. The bilateral PCAs are patent, supplied by posterior communicating arteries bilaterally (fetal origins).   There is no aneurysm or AVM.   Venous sinuses: Not well evaluated due to bolus timing.   Anatomic variants: As above.   Review of the MIP images confirms the above findings   IMPRESSION: 1. Loss of gray-white differentiation in the insula likely reflecting early infarct. Aspects is 9. 2. Proximal right M2 occlusion with minimal collateral flow throughout the MCA distribution. 3. Patent vasculature of the neck with no hemodynamically significant stenosis or occlusion.   MRI brain and MRA head (09/25/22): FINDINGS: MRI HEAD FINDINGS   Brain: There is large area of diffusion restriction with associated FLAIR signal abnormality in the right MCA distribution involving the external capsule, insular cortex, temporal lobe, frontal lobe, and parietal consistent with acute infarct. There is also a small area of infarct in the right superior frontal gyrus cortex near the vertex in the ACA distribution. Additional punctate infarcts are seen in the left cerebellar hemisphere, right caudate body/corona radiata, and right frontal lobe white matter. There is no evidence of hemorrhagic transformation. There is swelling with sulcal effacement but no midline shift.   There are punctate chronic microhemorrhages in the right caudate  body and right external capsule, nonspecific. The focus in the caudate body is unchanged since 2023.   Background parenchymal volume is normal. The ventricles are normal in size. There is no significant underlying chronic small-vessel ischemic change, there is a small remote infarct but in the right cerebral peduncle, unchanged  since 2023.   The pituitary and suprasellar region are normal. There is no solid mass lesion.   Vascular: See below.   Skull and upper cervical spine: Normal marrow signal.   Sinuses/Orbits: The paranasal sinuses are clear. The globes and orbits are unremarkable.   Other: None.   MRA HEAD FINDINGS   Anterior circulation: The intracranial ICAs are patent.   The right M1 segment is normal. There is persistent occlusion of a dominant right M2 branch in the sylvian fissure (9-111). Patent MCA branches are seen in the frontal operculum region; however, there is otherwise minimal collateral flow in the remainder of the MCA distribution.   The left M1 segment and distal branches are patent, without proximal stenosis or occlusion.   The bilateral ACAs are patent, without proximal stenosis or occlusion.   There is no aneurysm or AVM.   Posterior circulation: The bilateral V4 segments are patent. The basilar artery is patent. The major cerebellar arteries appear patent.   The bilateral PCAs are patent, primarily supplied by prominent posterior communicating arteries bilaterally (fetal origins). There is no proximal stenosis or occlusion   There is no aneurysm or AVM.   Anatomic variants: As above.   IMPRESSION: 1. Large area of acute infarct in the right MCA distribution, smaller area of acute infarct in the right ACA distribution, and punctate acute infarct in the left cerebellar hemisphere. No evidence of hemorrhagic transformation or midline shift. 2. Persistent occlusion of a right M2 branch with minimal collateral flow in the MCA distribution aside from in the region of the frontal operculum.   IR thrombectomy (09/24/22): FINDINGS: The right common carotid arteriogram demonstrates the right external carotid artery and its major branches to be widely patent.   The right internal carotid artery at the bulb to the cranial skull base is widely patent with moderate  tortuosity in the mid cervical right ICA.   The petrous, the cavernous and the supraclinoid right ICA demonstrate wide patency. A right posterior communicating artery is seen opacifying the right posterior cerebral artery distribution.   The right anterior cerebral artery opacifies into the capillary and venous phases.   The right middle cerebral artery M1 segment is widely patent as is the superior division. Occlusion is noted of the mid M2 segment of the inferior division.   IMPRESSION: Status post endovascular revascularization of occluded inferior division M2 segment of the right middle cerebral artery with 1 pass with a contact aspiration, 1 pass with contact aspiration and 3 mm x 20 mm Solitaire X retrieval device, 1 pass with contact aspiration and 1 pass with a 4 mm x 40 mm Solitaire X retrieval device, 1 pass with the 3 mm x 20 mm Solitaire X retrieval device with contact aspiration and finally with 1 pass with contact aspiration without significant change achieving a TICI 2B revascularization.   Echocardiogram (09/25/22): 1. 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   2. Left ventricular ejection fraction, by estimation, is 15%. The left  ventricle has severely decreased function. The left ventricle demonstrates  global hypokinesis. There is mild left ventricular hypertrophy. Left  ventricular diastolic parameters are  consistent with Grade III diastolic dysfunction (restrictive).  3. Right ventricular systolic function is moderately reduced. The right  ventricular size is normal.   4. The mitral valve is grossly normal. Mild mitral valve regurgitation.  No evidence of mitral stenosis.   5. Tricuspid valve regurgitation is moderate.   6. The aortic valve is tricuspid. Aortic valve regurgitation is not  visualized. No aortic stenosis is present.   7. The inferior vena cava is normal in size with <50% respiratory  variability, suggesting right atrial pressure of 8  mmHg, patient currently  intubated on mechanical ventilation.  LA normal in size.   CT head wo contrast (07/21/23): FINDINGS: Brain: Expected evolution of the prior right MCA and ACA territory infarcts with developing encephalomalacia in these regions. No evidence of acute large vascular territory infarct, acute hemorrhage, mass lesion or midline shift. No hydrocephalus.   Vascular: No hyperdense vessel identified.   Skull: No acute fracture.   Sinuses/Orbits: Clear sinuses.  No acute orbital findings.   Other: No mastoid effusions.   IMPRESSION: 1. Expected evolution of the prior right MCA and ACA territory infarcts. 2. No evidence of new/interval acute abnormality.  Assessment/Plan:  This is [name], a 48 y.o. 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 She has significant weakness and spasticity in LUE > LLE. Left foot drop contributing to poor ambulation. 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 No further seizures on Keppra  Spasticity - complication of stroke. Patient is very spastic in LUE, less so in LLE, but is having difficulty walking with possible contributions from spasticity. Migraines - respond to Ubrelvy . Was only having 3 headaches per month, but now almost daily. She has contraindications to most preventatives. TCA or SSRI is contraindicated as she is already on Lexapro , propranolol as she is on metoprolol , topamax as she is on other diuretics. She prefers a pill to a shot, so I will order Qulipta .  Plan: -Continue Keppra  750 mg BID for seizures -For secondary stroke prevention: continue dabigatraban 150 mg BID and atorvastatin  80 mg daily -Sleep medicine referral for untreated OSA -OT drivers evaluation referral at patient's request. I'm not sure she will be safely able to drive, but this is very important to her. -Referral to Dr. Carilyn for consideration of botox for spasticity -Recommend wearing AFO - has one but does not wear  it -Fall precautions discussed -Stroke precautions discussed  For migraines: Migraine prevention:  Start Qulipta  30 mg daily. Will get start approval process Migraine rescue:  Continue Ubrelvy  100 mg as needed at headache onset Limit use of pain relievers to no more than 2 days out of week to prevent risk of rebound or medication-overuse headache. Keep headache diary   Return to clinic in 6 months  Total time spent reviewing records, interview, history/exam, documentation, and coordination of care on day of encounter:  60 min  Venetia Potters, MD

## 2024-01-24 ENCOUNTER — Other Ambulatory Visit: Payer: Self-pay | Admitting: Licensed Clinical Social Worker

## 2024-01-25 ENCOUNTER — Ambulatory Visit (HOSPITAL_COMMUNITY)
Admission: RE | Admit: 2024-01-25 | Discharge: 2024-01-25 | Disposition: A | Discharge status: Home / Self Care | Source: Ambulatory Visit | Attending: Family Medicine | Admitting: Family Medicine

## 2024-01-25 DIAGNOSIS — J9859 Other diseases of mediastinum, not elsewhere classified: Secondary | ICD-10-CM | POA: Diagnosis present

## 2024-01-25 DIAGNOSIS — M7989 Other specified soft tissue disorders: Secondary | ICD-10-CM | POA: Diagnosis present

## 2024-01-25 MED ORDER — IOHEXOL 300 MG/ML  SOLN
75.0000 mL | Freq: Once | INTRAMUSCULAR | Status: AC | PRN
  Administered 2024-01-25: 75 mL via INTRAVENOUS

## 2024-01-30 NOTE — Patient Outreach (Signed)
 Complex Care Management   Visit Note  01/24/2024  Name:  [name] MRN: [id number] DOB: [birth year]  Situation: Referral received for Complex Care Management related to Mental/Behavioral Health diagnosis Depression I obtained verbal consent from Patient.  Visit completed with pt  on the phone  Background:   Past Medical History:  Diagnosis Date   Abnormal Pap smear    Acute combined systolic and diastolic heart failure (HCC) 02/19/2019   Wt Readings from Last 3 Encounters:  09/18/21  84.7 kg  09/02/21  84.4 kg  08/31/21  84.2 kg         Acute heart failure (HCC) 01/29/2022   Acute on chronic combined systolic and diastolic CHF (congestive heart failure) (HCC) 02/19/2019   Acute on chronic congestive heart failure (HCC)    Acute on chronic congestive heart failure (HCC)    Acute on chronic diastolic CHF (congestive heart failure) (HCC) 10/19/2021   Acute on chronic systolic heart failure (HCC) 02/22/2022   Anemia    Asthma    Atrial fibrillation (HCC)    history of paroxysmal atrial fibrillation   CHF (congestive heart failure) (HCC) 11/02/2021   CHF exacerbation (HCC) 10/17/2021   Diabetes mellitus    DKA, type 2 (HCC) 02/06/2019   Elevated troponin 09/30/2019   Heart failure with reduced ejection fraction (HCC)    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  Consider chronic etiology given continued fertility issues   Hypertension    Hypertensive emergency 09/30/2019   Hypertensive urgency 06/02/2022   Major depressive disorder 02/08/2012   Reports that her infertility is a major cause of her depression Reports Prozac  has worked in the past for her.     Migraines    Morbid obesity (HCC) 02/06/2019   Nausea & vomiting 06/26/2022   Nonischemic cardiomyopathy (HCC) 09/22/2021   NSTEMI (non-ST elevated myocardial infarction) (HCC) 02/23/2022   Obstructive sleep apnea 06/30/2015   Polycystic ovarian disease      Assessment: Patient Reported Symptoms:  Cognitive Cognitive Status: No symptoms reported, Alert and oriented to person, place, and time Cognitive/Intellectual Conditions Management [RPT]: None reported or documented in medical history or problem list      Neurological Neurological Review of Symptoms: No symptoms reported    HEENT HEENT Symptoms Reported: Not assessed      Cardiovascular Cardiovascular Symptoms Reported: Not assessed    Respiratory Respiratory Symptoms Reported: Not assesed    Endocrine Endocrine Symptoms Reported: Not assessed    Gastrointestinal Gastrointestinal Symptoms Reported: Not assessed      Genitourinary Genitourinary Symptoms Reported: Not assessed    Integumentary Integumentary Symptoms Reported: Not assessed    Musculoskeletal Musculoskelatal Symptoms Reviewed: Muscle pain, Difficulty walking, Unsteady gait Musculoskeletal Management Strategies: Routine screening, Coping strategies, Medication therapy      Psychosocial Psychosocial Symptoms Reported: Depression - if selected complete PHQ 2-9 Additional Psychological Details: Chronic pain Behavioral Management Strategies: Coping strategies, Support system Major Change/Loss/Stressor/Fears (CP): Medical condition, self        12/27/2023    3:55 PM  Depression screen PHQ 2/9  Decreased Interest 3  Down, Depressed, Hopeless 2  PHQ - 2 Score 5  Altered sleeping 0  Tired, decreased energy 1  Change in appetite 0  Feeling bad or failure about yourself  1  Trouble concentrating 0  Moving slowly or fidgety/restless 0  Suicidal thoughts --  PHQ-9 Score 7    There were no vitals  filed for this visit.  Medications Reviewed Today     Reviewed by Oreste Majeed D, LCSW (Social Worker) on 01/30/24 at 0932  Med List Status: <None>   Medication Order Taking? Sig Documenting Provider Last Dose Status Informant  Accu-Chek Softclix Lancets lancets [id number] No USE 1  TO CHECK GLUCOSE 4 TIMES  DAILY  Patient not taking: Reported on 11/01/2023   Jeanelle Layman CROME, MD Not Taking Active Father           Med Note ANGELICA, Hollin Crewe D   Wed Nov 01, 2023  1:20 PM) Have to pay out of pocket fees   Accu-Chek Softclix Lancets lancets [id number] No Use each in the morning, at noon, and at bedtime.  Patient not taking: Reported on 12/27/2023   Lorilee Sven SQUIBB, MD Not Taking Active   acetaminophen  (TYLENOL ) 500 MG tablet [id number] No Take 500-1,000 mg by mouth every 6 (six) hours as needed for mild pain or headache (OR CRAMPING).  Patient not taking: Reported on 11/01/2023   [provider] Not Taking Active Father  acetaminophen -codeine  (TYLENOL  #3) 300-30 MG tablet [id number] No Take 1 tablet by mouth 2 (two) times daily as needed for moderate pain (pain score 4-6). Lorilee Sven SQUIBB, MD Taking Active   albuterol  (VENTOLIN  HFA) 108 (90 Base) MCG/ACT inhaler [id number] No Inhale 2 puffs into the lungs every 6 (six) hours as needed for wheezing or shortness of breath. Christia Budds, MD Taking Active   atorvastatin  (LIPITOR ) 80 MG tablet [id number] No Take 1 tablet (80 mg total) by mouth daily after supper. McDiarmid, Krystal BIRCH, MD Taking Active   bisacodyl  5 MG EC tablet [id number] No Take 2 tablets (10 mg total) by mouth every 24 hours as needed for constipation.  Taking Active            Med Note (Jerlean Peralta D   Wed Nov 01, 2023  1:22 PM) Need a refill   blood glucose meter kit and supplies [id number] No Use up to 4 (four) times daily as directed  Patient not taking: Reported on 12/27/2023   Lorilee Sven SQUIBB, MD Not Taking Active   Blood Glucose Monitoring Suppl (ACCU-CHEK GUIDE) w/Device KIT [id number] No Use up to 4 (four) times daily. Lorilee Sven SQUIBB, MD Taking Active   Discontinued 07/01/22 [id number]          Med Note>> Margrette Levorn POUR, [facility]   07/01/2022  9:52 AM changed to Toprol  XL    Cholecalciferol  (VITAMIN D ) 125 MCG (5000 UT) CAPS [id number] No Take one capsule by mouth once  daily. McDiarmid, Krystal BIRCH, MD Taking Active   dabigatran  (PRADAXA ) 150 MG CAPS capsule [id number] No Take 1 capsule (150 mg total) by mouth 2 (two) times daily for anticoagulant. Christia Budds, MD Taking Active   empagliflozin  (JARDIANCE ) 25 MG TABS tablet [id number] No Take 1 tablet (25 mg total) by mouth daily. McDiarmid, Krystal BIRCH, MD Taking Active   escitalopram  (LEXAPRO ) 20 MG tablet [id number] No Take 1 tablet (20 mg total) by mouth at bedtime. Christia Budds, MD Taking Active   gabapentin  (NEURONTIN ) 300 MG capsule [id number]  Take 1 capsule (300 mg total) by mouth at bedtime. Christia Budds, MD  Active   glipiZIDE  (GLUCOTROL ) 5 MG tablet [id number] No Take 1 tablet (5 mg total) by mouth daily before breakfast. McDiarmid, Krystal BIRCH, MD Taking Active   glucose blood (ACCU-CHEK GUIDE) test strip [id number] No USE  4 TIMES DAILY  Patient not taking: Reported on 12/27/2023  Ganta, Anupa, DO Not Taking Active Father  hydrOXYzine  (ATARAX ) 10 MG tablet [id number] No Take 1 tablet (10 mg total) by mouth every 8 (eight) hours as needed for itching.  Taking Active   hydrOXYzine  (ATARAX ) 10 MG tablet [id number] No Take 1 tablet (10 mg total) by mouth 3 (three) times daily as needed for anxiety. Christia Budds, MD Taking Active   levETIRAcetam  (KEPPRA ) 750 MG tablet [id number] No Take 1 tablet (750 mg total) by mouth 2 (two) times daily for seizures.  Taking Active   lidocaine  (LIDODERM ) 5 % [id number] No Place 1 patch onto the skin daily. Remove & Discard patch within 12 hours or as directed by MD Raulkar, Sven SQUIBB, MD Taking Active   losartan  (COZAAR ) 50 MG tablet [id number]  Take 1 tablet (50 mg total) by mouth daily. Christia Budds, MD  Active   melatonin 5 MG TABS [id number]  Take 1 tablet (5 mg total) by mouth at bedtime. Christia Budds, MD  Active   metoprolol  tartrate (LOPRESSOR ) 25 MG tablet [id number] No Take 1 tablet (25 mg total) by mouth 2 (two) times daily. McDiarmid, Krystal BIRCH, MD Taking Active    spironolactone  (ALDACTONE ) 25 MG tablet [id number] No Take 1 tablet (25 mg total) by mouth daily. McDiarmid, Krystal BIRCH, MD Taking Active   tiZANidine  (ZANAFLEX ) 4 MG tablet [id number]  Take 1 tablet (4 mg total) by mouth every 6 (six) hours as needed for muscle spasms. Christia Budds, MD  Active   torsemide  (DEMADEX ) 20 MG tablet [id number] No Take 1 tablet (20 mg total) by mouth daily. Christia Budds, MD Taking Active Father  traZODone  (DESYREL ) 150 MG tablet [id number]  Take 1 tablet (150 mg total) by mouth at bedtime for insomnia. Christia Budds, MD  Active   Ubrogepant  (UBRELVY ) 100 MG TABS [id number] No Take 1 tablet (100 mg total) by mouth daily as needed. Leigh Venetia CROME, MD Taking Active             Recommendation:   Continue Current Plan of Care  Follow Up Plan:   Telephone follow-up 2-4 weeks  Rolin Kerns, LCSW [facility]  [facility], [facility] Clinical Social Worker Direct Dial: [phone]  Fax: [phone] Website: [email or web address] 9:41 AM

## 2024-01-30 NOTE — Patient Instructions (Signed)
 Visit Information  Thank you for taking time to visit with me today. Please don't hesitate to contact me if I can be of assistance to you before our next scheduled appointment.  Your next care management appointment is by telephone on 7/30 at 3:30 PM  Please call the care guide team at [phone] if you need to cancel, schedule, or reschedule an appointment.   Please call the Suicide and Crisis Lifeline: 988 go to [facility] [address], [place] [phone]) call 911 if you are experiencing a Mental Health or Behavioral Health Crisis or need someone to talk to.  Rolin Kerns, LCSW [place]  [facility], [facility] Clinical Social Worker Direct Dial: [phone]  Fax: [phone] Website: [email or web address] 9:42 AM

## 2024-01-31 ENCOUNTER — Other Ambulatory Visit (HOSPITAL_COMMUNITY): Payer: Self-pay

## 2024-01-31 ENCOUNTER — Ambulatory Visit (INDEPENDENT_AMBULATORY_CARE_PROVIDER_SITE_OTHER): Payer: Medicare Other | Admitting: Neurology

## 2024-01-31 ENCOUNTER — Encounter: Payer: Self-pay | Admitting: Neurology

## 2024-01-31 VITALS — BP 137/85 | HR 80 | Ht 59.0 in | Wt 136.0 lb

## 2024-01-31 DIAGNOSIS — G43009 Migraine without aura, not intractable, without status migrainosus: Secondary | ICD-10-CM | POA: Diagnosis not present

## 2024-01-31 DIAGNOSIS — G4733 Obstructive sleep apnea (adult) (pediatric): Secondary | ICD-10-CM

## 2024-01-31 DIAGNOSIS — M21372 Foot drop, left foot: Secondary | ICD-10-CM

## 2024-01-31 DIAGNOSIS — R252 Cramp and spasm: Secondary | ICD-10-CM

## 2024-01-31 DIAGNOSIS — R569 Unspecified convulsions: Secondary | ICD-10-CM

## 2024-01-31 DIAGNOSIS — I63511 Cerebral infarction due to unspecified occlusion or stenosis of right middle cerebral artery: Secondary | ICD-10-CM

## 2024-01-31 DIAGNOSIS — R2689 Other abnormalities of gait and mobility: Secondary | ICD-10-CM

## 2024-01-31 DIAGNOSIS — I69398 Other sequelae of cerebral infarction: Secondary | ICD-10-CM

## 2024-01-31 MED ORDER — ACETAMINOPHEN 500 MG PO TABS
500.0000 mg | ORAL_TABLET | Freq: Four times a day (QID) | ORAL | 5 refills | Status: AC | PRN
  Filled 2024-01-31: qty 30, 4d supply, fill #0
  Filled 2024-03-19: qty 30, 4d supply, fill #1
  Filled 2024-04-15 (×2): qty 30, 4d supply, fill #2
  Filled 2024-05-09: qty 30, 4d supply, fill #3
  Filled 2024-05-20: qty 30, 4d supply, fill #4
  Filled 2024-06-17: qty 30, 4d supply, fill #5

## 2024-01-31 MED ORDER — QULIPTA 30 MG PO TABS
30.0000 mg | ORAL_TABLET | Freq: Every day | ORAL | 11 refills | Status: DC
  Filled 2024-01-31: qty 30, 30d supply, fill #0
  Filled 2024-05-20: qty 30, 30d supply, fill #1

## 2024-01-31 NOTE — Patient Instructions (Addendum)
 I want you to continue current medications including: -Continue Keppra  750 mg twice daily for seizures  To help reduce stroke risk: -Continue dabigatran  150 mg twice daily -Continue atorvastatin  80 mg daily  For migraines: Migraine prevention:  I will order Qulipta  today that you take every day to prevent headaches. I will need to get this approved, so you will not get the medication until insurance approves. Migraine rescue:  Continue Ubrelvy  100 mg as needed at headache onset Limit use of pain relievers to no more than 2 days out of week to prevent risk of rebound or medication-overuse headache. Keep headache diary  I am referring you to Dr. Carilyn for consideration of botox to help loosen your left arm and leg.  I am also referring you to sleep medicine for sleep apnea.  Continue home exercises given by physical therapy. I also recommend you wear the brace for the left ankle you have to prevent falls.  If you have new difficulty speaking, face droop, numbness on one side of the body, weakness on one side of the body, or dizziness/imbalance, this could be the sign of a stroke. Don't wait, please call EMS and be evaluated at the nearest emergency room.  I will see you back in clinic in 6 months. Please let me know if you have any questions or concerns in the meantime.  The physicians and staff at [facility] are committed to providing excellent care. You may receive a survey requesting feedback about your experience at our office. We strive to receive very good responses to the survey questions. If you feel that your experience would prevent you from giving the office a very good  response, please contact our office to try to remedy the situation. We may be reached at [phone]. Thank you for taking the time out of your busy day to complete the survey.  Venetia Potters, MD [facility]  Preventing Falls at [facility] are common, often dreaded events in the lives of  older people. Aside from the obvious injuries and even death that may result, fall can cause wide-ranging consequences including loss of independence, mental decline, decreased activity and mobility. Younger people are also at risk of falling, especially those with chronic illnesses and fatigue.  Ways to reduce risk for falling Examine diet and medications. Warm foods and alcohol dilate blood vessels, which can lead to dizziness when standing. Sleep aids, antidepressants and pain medications can also increase the likelihood of a fall.  Get a vision exam. Poor vision, cataracts and glaucoma increase the chances of falling.  Check foot gear. Shoes should fit snugly and have a sturdy, nonskid sole and a broad, low heel  Participate in a physician-approved exercise program to build and maintain muscle strength and improve balance and coordination. Programs that use ankle weights or stretch bands are excellent for muscle-strengthening. Water aerobics programs and low-impact Tai Chi programs have also been shown to improve balance and coordination.  Increase vitamin D  intake. Vitamin D  improves muscle strength and increases the amount of calcium  the body is able to absorb and deposit in bones.  How to prevent falls from common hazards Floors - Remove all loose wires, cords, and throw rugs. Minimize clutter. Make sure rugs are anchored and smooth. Keep furniture in its usual place.  Chairs -- Use chairs with straight backs, armrests and firm seats. Add firm cushions to existing pieces to add height.  Bathroom - Install grab bars and non-skid tape in the tub or shower.  Use a bathtub transfer bench or a shower chair with a back support Use an elevated toilet seat and/or safety rails to assist standing from a low surface. Do not use towel racks or bathroom tissue holders to help you stand.  Lighting - Make sure halls, stairways, and entrances are well-lit. Install a night light in your bathroom or hallway.  Make sure there is a light switch at the top and bottom of the staircase. Turn lights on if you get up in the middle of the night. Make sure lamps or light switches are within reach of the bed if you have to get up during the night.  Kitchen - Install non-skid rubber mats near the sink and stove. Clean spills immediately. Store frequently used utensils, pots, pans between waist and eye level. This helps prevent reaching and bending. Sit when getting things out of lower cupboards.  Living room/ Bedrooms - Place furniture with wide spaces in between, giving enough room to move around. Establish a route through the living room that gives you something to hold onto as you walk.  Stairs - Make sure treads, rails, and rugs are secure. Install a rail on both sides of the stairs. If stairs are a threat, it might be helpful to arrange most of your activities on the lower level to reduce the number of times you must climb the stairs.  Entrances and doorways - Install metal handles on the walls adjacent to the doorknobs of all doors to make it more secure as you travel through the doorway.  Tips for maintaining balance Keep at least one hand free at all times. Try using a backpack or fanny pack to hold things rather than carrying them in your hands. Never carry objects in both hands when walking as this interferes with keeping your balance.  Attempt to swing both arms from front to back while walking. This might require a conscious effort if Parkinson's disease has diminished your movement. It will, however, help you to maintain balance and posture, and reduce fatigue.  Consciously lift your feet off of the ground when walking. Shuffling and dragging of the feet is a common culprit in losing your balance.  When trying to navigate turns, use a U technique of facing forward and making a wide turn, rather than pivoting sharply.  Try to stand with your feet shoulder-length apart. When your feet are close  together for any length of time, you increase your risk of losing your balance and falling.  Do one thing at a time. Don't try to walk and accomplish another task, such as reading or looking around. The decrease in your automatic reflexes complicates motor function, so the less distraction, the better.  Do not wear rubber or gripping soled shoes, they might catch on the floor and cause tripping.  Move slowly when changing positions. Use deliberate, concentrated movements and, if needed, use a grab bar or walking aid. Count 15 seconds between each movement. For example, when rising from a seated position, wait 15 seconds after standing to begin walking.  If balance is a continuous problem, you might want to consider a walking aid such as a cane, walking stick, or walker. Once you've mastered walking with help, you might be ready to try it on your own again.

## 2024-02-01 ENCOUNTER — Other Ambulatory Visit (HOSPITAL_COMMUNITY): Payer: Self-pay

## 2024-02-01 ENCOUNTER — Ambulatory Visit (HOSPITAL_COMMUNITY): Payer: Self-pay | Admitting: Family Medicine

## 2024-02-01 ENCOUNTER — Telehealth: Payer: Self-pay

## 2024-02-01 ENCOUNTER — Other Ambulatory Visit: Payer: Self-pay

## 2024-02-01 ENCOUNTER — Telehealth: Payer: Self-pay | Admitting: Pharmacy Technician

## 2024-02-01 NOTE — Addendum Note (Signed)
 Addended by: TERRIL CHARLIES MATSU on: 02/01/2024 04:21 PM   Modules accepted: Orders

## 2024-02-01 NOTE — Telephone Encounter (Signed)
 Pharmacy Patient Advocate Encounter   Received notification from Pt Calls Messages that prior authorization for QULIPTA  30MG  is required/requested.   Insurance verification completed.   The patient is insured through [place] .   Per test claim: PA required; PA submitted to above mentioned insurance via CoverMyMeds Key/confirmation #/[id number] Status is pending

## 2024-02-01 NOTE — Telephone Encounter (Signed)
 PA has been submitted, and telephone encounter has been created. Please see telephone encounter dated 7.10.25.

## 2024-02-01 NOTE — Progress Notes (Signed)
 Nodular tissue decrease in size, will repeat CT in 6 months.  2 mm right lower lobe nodule, no follow-up needed

## 2024-02-01 NOTE — Telephone Encounter (Signed)
 Pharmacy Patient Advocate Encounter  Received notification from [employer] that Prior Authorization for QULIPTA  30MG  has been APPROVED from 7.9.25 to 12.31.25. Unable to obtain price due to refill too soon rejection, last fill date 7.9.25 next available fill date8.1.25

## 2024-02-02 NOTE — Telephone Encounter (Signed)
 Called pt and informed her of the Qulipta  approval and that I sent the driving Eval was sent out and if she has not heard from them in 1 to 2 weeks please let use know.

## 2024-02-06 ENCOUNTER — Other Ambulatory Visit: Payer: Self-pay

## 2024-02-06 ENCOUNTER — Other Ambulatory Visit (HOSPITAL_COMMUNITY): Payer: Self-pay

## 2024-02-06 ENCOUNTER — Other Ambulatory Visit: Payer: Self-pay | Admitting: Student

## 2024-02-06 ENCOUNTER — Other Ambulatory Visit: Payer: Self-pay | Admitting: Physical Medicine and Rehabilitation

## 2024-02-06 DIAGNOSIS — F5101 Primary insomnia: Secondary | ICD-10-CM

## 2024-02-06 DIAGNOSIS — M79605 Pain in left leg: Secondary | ICD-10-CM

## 2024-02-06 MED ORDER — TIZANIDINE HCL 4 MG PO TABS
4.0000 mg | ORAL_TABLET | Freq: Four times a day (QID) | ORAL | 0 refills | Status: DC | PRN
  Filled 2024-02-06: qty 30, 8d supply, fill #0

## 2024-02-06 MED ORDER — TRAZODONE HCL 150 MG PO TABS
150.0000 mg | ORAL_TABLET | Freq: Every evening | ORAL | 0 refills | Status: DC
Start: 2024-02-06 — End: 2024-03-19
  Filled 2024-02-06: qty 30, 30d supply, fill #0

## 2024-02-09 ENCOUNTER — Encounter: Attending: Physical Medicine and Rehabilitation | Admitting: Physical Medicine & Rehabilitation

## 2024-02-09 ENCOUNTER — Ambulatory Visit: Admitting: Family Medicine

## 2024-02-09 ENCOUNTER — Encounter: Payer: Self-pay | Admitting: Physical Medicine & Rehabilitation

## 2024-02-09 ENCOUNTER — Encounter: Payer: Self-pay | Admitting: Family Medicine

## 2024-02-09 VITALS — BP 157/91 | HR 75 | Ht 59.0 in

## 2024-02-09 VITALS — BP 147/107 | HR 87 | Ht 59.0 in

## 2024-02-09 DIAGNOSIS — G8114 Spastic hemiplegia affecting left nondominant side: Secondary | ICD-10-CM | POA: Diagnosis present

## 2024-02-09 DIAGNOSIS — Z5181 Encounter for therapeutic drug level monitoring: Secondary | ICD-10-CM | POA: Diagnosis present

## 2024-02-09 DIAGNOSIS — G894 Chronic pain syndrome: Secondary | ICD-10-CM | POA: Insufficient documentation

## 2024-02-09 DIAGNOSIS — R32 Unspecified urinary incontinence: Secondary | ICD-10-CM | POA: Diagnosis present

## 2024-02-09 DIAGNOSIS — Z79899 Other long term (current) drug therapy: Secondary | ICD-10-CM | POA: Insufficient documentation

## 2024-02-09 DIAGNOSIS — F4325 Adjustment disorder with mixed disturbance of emotions and conduct: Secondary | ICD-10-CM | POA: Diagnosis present

## 2024-02-09 NOTE — Progress Notes (Signed)
 Subjective:    Patient ID: [name], female    DOB: 10/11/1975, 48 y.o.   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 The patient has gone through inpatient and outpatient therapy She is requiring assistance for dressing and bathing but is able to ambulate short distances with supervision she is not receiving therapy services at the current time.  She has not tried Botox in the past.  She is also wondering about pills for spasticity. She primarily complains of spasticity affecting her left upper extremity more so than the left lower extremity Pain Inventory Average Pain 10 Pain Right Now 10 My pain is constant, sharp, tingling, and aching  In the last 24 hours, has pain interfered with the following? General activity 10 Relation with others 10 Enjoyment of life 10 What TIME of day is your pain at its worst? morning , daytime, evening, and night Sleep (in general) Poor  Pain is worse with: constant pain Pain improves with: no relief  Relief from Meds: 0  Family History  Problem Relation Age of Onset   Diabetes Mother    Hypertension Mother    Hypertension Father    Diabetes Father    Social History   Socioeconomic History   Marital status: Single    Spouse name: Not on file   Number of children: Not on file   Years of education: Not on file   Highest education level: Not on file  Occupational History   Not on file  Tobacco Use   Smoking status: Never   Smokeless tobacco: Never   Tobacco comments:    Never smoke 10/22/21  Vaping Use   Vaping status: Never Used  Substance and Sexual Activity   Alcohol use: No    Alcohol/week: 0.0 standard drinks of alcohol   Drug use: No   Sexual activity: Not Currently    Birth control/protection: None  Other Topics Concern    Not on file  Social History Narrative   Are you right handed or left handed? right   Are you currently employed ?    What is your current occupation?   Do you live at home alone?   Who lives with you?    What type of home do you live in: 1 story or 2 story?        Social Drivers of Corporate investment banker Strain: Low Risk  (04/24/2023)   Received from [facility]   Overall Financial Resource Strain (CARDIA)    Difficulty of Paying Living Expenses: Not hard at all  Food Insecurity: No Food Insecurity (12/27/2023)   Hunger Vital Sign    Worried About Running Out of Food in the Last Year: Never true    Ran Out of Food in the Last Year: Never true  Transportation Needs: No Transportation Needs (12/27/2023)   PRAPARE - Administrator, Civil Service (Medical): No    Lack of Transportation (Non-Medical): No  Physical Activity: Inactive (04/04/2023)   Exercise Vital Sign    Days of Exercise per Week: 0 days    Minutes of Exercise per Session: 0 min  Stress: Stress Concern Present (04/04/2023)   [employer] of Occupational Health - Occupational Stress Questionnaire    Feeling of Stress : Very much  Social Connections: Socially Isolated (04/04/2023)  Social Connection and Isolation Panel    Frequency of Communication with Friends and Family: Once a week    Frequency of Social Gatherings with Friends and Family: Never    Attends Religious Services: Never    Database administrator or Organizations: No    Attends Banker Meetings: Never    Marital Status: Never married   Past Surgical History:  Procedure Laterality Date   BIOPSY  11/05/2021   Procedure: BIOPSY;  Surgeon: Stacia Glendia BRAVO, MD;  Location: [facility] ENDOSCOPY;  Service: Gastroenterology;;   CERVIX LESION DESTRUCTION     CESAREAN SECTION     ESOPHAGOGASTRODUODENOSCOPY (EGD) WITH PROPOFOL  N/A 11/05/2021   Procedure: ESOPHAGOGASTRODUODENOSCOPY (EGD) WITH PROPOFOL ;  Surgeon: Stacia Glendia BRAVO,  MD;  Location: [facility] ENDOSCOPY;  Service: Gastroenterology;  Laterality: N/A;   IR CT HEAD LTD  09/24/2022   IR PERCUTANEOUS ART THROMBECTOMY/INFUSION INTRACRANIAL INC DIAG ANGIO  09/24/2022   RADIOLOGY WITH ANESTHESIA N/A 09/24/2022   Procedure: IR WITH ANESTHESIA;  Surgeon: Radiologist, Medication, MD;  Location: [facility] OR;  Service: Radiology;  Laterality: N/A;   RIGHT/LEFT HEART CATH AND CORONARY ANGIOGRAPHY N/A 03/29/2019   Procedure: RIGHT/LEFT HEART CATH AND CORONARY ANGIOGRAPHY;  Surgeon: Rolan Ezra RAMAN, MD;  Location: [facility] INVASIVE CV LAB;  Service: Cardiovascular;  Laterality: N/A;   RIGHT/LEFT HEART CATH AND CORONARY ANGIOGRAPHY N/A 09/20/2021   Procedure: RIGHT/LEFT HEART CATH AND CORONARY ANGIOGRAPHY;  Surgeon: Rolan Ezra RAMAN, MD;  Location: [facility] INVASIVE CV LAB;  Service: Cardiovascular;  Laterality: N/A;   Past Surgical History:  Procedure Laterality Date   BIOPSY  11/05/2021   Procedure: BIOPSY;  Surgeon: Stacia Glendia BRAVO, MD;  Location: [facility] ENDOSCOPY;  Service: Gastroenterology;;   CERVIX LESION DESTRUCTION     CESAREAN SECTION     ESOPHAGOGASTRODUODENOSCOPY (EGD) WITH PROPOFOL  N/A 11/05/2021   Procedure: ESOPHAGOGASTRODUODENOSCOPY (EGD) WITH PROPOFOL ;  Surgeon: Stacia Glendia BRAVO, MD;  Location: [facility] ENDOSCOPY;  Service: Gastroenterology;  Laterality: N/A;   IR CT HEAD LTD  09/24/2022   IR PERCUTANEOUS ART THROMBECTOMY/INFUSION INTRACRANIAL INC DIAG ANGIO  09/24/2022   RADIOLOGY WITH ANESTHESIA N/A 09/24/2022   Procedure: IR WITH ANESTHESIA;  Surgeon: Radiologist, Medication, MD;  Location: [facility] OR;  Service: Radiology;  Laterality: N/A;   RIGHT/LEFT HEART CATH AND CORONARY ANGIOGRAPHY N/A 03/29/2019   Procedure: RIGHT/LEFT HEART CATH AND CORONARY ANGIOGRAPHY;  Surgeon: Rolan Ezra RAMAN, MD;  Location: [facility] INVASIVE CV LAB;  Service: Cardiovascular;  Laterality: N/A;   RIGHT/LEFT HEART CATH AND CORONARY ANGIOGRAPHY N/A 09/20/2021   Procedure: RIGHT/LEFT HEART CATH AND CORONARY ANGIOGRAPHY;  Surgeon:  Rolan Ezra RAMAN, MD;  Location: [facility] INVASIVE CV LAB;  Service: Cardiovascular;  Laterality: N/A;   Past Medical History:  Diagnosis Date   Abnormal Pap smear    Acute combined systolic and diastolic heart failure (HCC) 02/19/2019   Wt Readings from Last 3 Encounters:  09/18/21  84.7 kg  09/02/21  84.4 kg  08/31/21  84.2 kg         Acute heart failure (HCC) 01/29/2022   Acute on chronic combined systolic and diastolic CHF (congestive heart failure) (HCC) 02/19/2019   Acute on chronic congestive heart failure (HCC)    Acute on chronic congestive heart failure (HCC)    Acute on chronic diastolic CHF (congestive heart failure) (HCC) 10/19/2021   Acute on chronic systolic heart failure (HCC) 02/22/2022   Anemia    Asthma    Atrial fibrillation (HCC)    history of paroxysmal atrial fibrillation  CHF (congestive heart failure) (HCC) 11/02/2021   CHF exacerbation (HCC) 10/17/2021   Diabetes mellitus    DKA, type 2 (HCC) 02/06/2019   Elevated troponin 09/30/2019   Heart failure with reduced ejection fraction (HCC)    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  Consider chronic etiology given continued fertility issues   Hypertension    Hypertensive emergency 09/30/2019   Hypertensive urgency 06/02/2022   Major depressive disorder 02/08/2012   Reports that her infertility is a major cause of her depression Reports Prozac  has worked in the past for her.     Migraines    Morbid obesity (HCC) 02/06/2019   Nausea & vomiting 06/26/2022   Nonischemic cardiomyopathy (HCC) 09/22/2021   NSTEMI (non-ST elevated myocardial infarction) (HCC) 02/23/2022   Obstructive sleep apnea 06/30/2015   Polycystic ovarian disease    BP (!) 157/91 (BP Location: Right Arm, Patient Position: Sitting, Cuff Size: Large) Comment: second BP reading  Pulse 75   Ht 4' 11 (1.499 m)   SpO2 98%   BMI 27.47 kg/m   Opioid Risk Score:   Fall Risk Score:   `1  Depression screen [facility] 2/9     02/09/2024   11:43 AM 12/27/2023    3:55 PM 11/01/2023    1:11 PM 07/27/2023    2:59 PM 07/04/2023   10:56 AM 04/04/2023   10:32 AM 02/21/2023   10:45 AM  Depression screen PHQ 2/9  Decreased Interest 1 3 2 3 1 3 2   Down, Depressed, Hopeless 1 2 0 3 1 3 2   PHQ - 2 Score 2 5 2 6 2 6 4   Altered sleeping 2 0 3 3  3  0  Tired, decreased energy 1 1 2 3  3 1   Change in appetite 0 0 0 0  0 0  Feeling bad or failure about yourself  0 1 1 2  2 3   Trouble concentrating 0 0 0 2  2 2   Moving slowly or fidgety/restless 0 0 0 2  0 0  Suicidal thoughts 0 -- 0 0  0 1  PHQ-9 Score 5 7 8 18  16 11   Difficult doing work/chores Very difficult   Very difficult  Very difficult       Review of Systems  Endocrine:       Diabetes II  Musculoskeletal:  Positive for myalgias.       Patient reports left sided pain from shoulder to foot  Neurological:  Positive for weakness and numbness.       CVA, left sided numbness, weakness  All other systems reviewed and are negative.      Objective:   Physical Exam General No acute distress Mood and affect mildly labile Motor strength is 5/5 in the right deltoid, bicep, tricep, grip, hip flexors, knee extensors, ankle dorsiflexion plantarflexion Left upper extremity 3 - at the deltoid bicep to minus tricep trace finger flexors 0 finger extensors Left lower limb 3 - at the hip flexor 4 - knee extensor 2 - ankle dorsiflexor, 3 - ankle plantar flexor   Patient did not ambulate she did not have cane with her today she did show me a video of her ambulating without a cane at home short step length foot externally rotated at the hip reduced heel strike LEFT Elbow flexors  MAS 3 Finger Flexors MAS 3 Wrist Flexors MAS 3 Lumbricals MAS 2 Left  Foot invertors MAS 3  Ankle PF MAS4  There is pain with finger and wrist extension passively.  There is no evidence of hypersensitivity to touch to the hand there is no evidence of swelling of  the hand.     Assessment & Plan:   1.  History of right MCA infarct greater than 1 year post we discussed that given timeframe unlikely to get spontaneous recovery any significant degree at this point. In terms her spasticity management I do think she would benefit from botulinum toxin injection.  We discussed that Botox is not a medication that increases strength but rather than just reduces excessive tone.  The usual onset is about 1 week and duration is 2-1/2 to 3 months and requires reinjections. She would like to pursue this she is needle phobic but thinks she can get through the procedure Would recommend the following doses Botox 400 units total Left upper limb Biceps 75 units Brachialis 75 units FCR 50 units FDS 50 units FDP 50 units FPL 25 units  Left lower limb Posterior tibialis 75 units  2.  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

## 2024-02-09 NOTE — Patient Instructions (Signed)
 It was wonderful to see you today!  Today you were seen to discuss her issues with urinary incontinence.  I have placed a DME order for incontinence care supplies which will be sent to our DME coordinator.  Once everything is approved through your insurance I sent to the appropriate supplier your supplies will be sent directly to you.  As we discussed, we will likely need to renew this order once again you are moving forward.  Keep this in the back of your mind and to schedule at least 1 appointment every year to discuss your ongoing need.  Please call [phone] with any questions about today's appointment.   If you need any additional refills, please call your pharmacy before calling the office.  Lucie Pinal, DO Family Medicine

## 2024-02-09 NOTE — Progress Notes (Signed)
    SUBJECTIVE:   CHIEF COMPLAINT / HPI:   Incontinence discussion, supplies  - started after being placed on her BP meds.  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  She often does this when she has appointments to avoid wetting herself in public.  PERTINENT  PMH / PSH: V mural thrombus, paroxysmal A-fib, CVA, biventricular failure, nonischemic cardiomyopathy, hypertension, left-sided hemiplegia  OBJECTIVE:   BP (!) 147/107   Pulse 87   Ht 4' 11 (1.499 m)   SpO2 100%   BMI 27.47 kg/m   General: A&O, NAD HEENT: No sign of trauma, EOM grossly intact Cardiac: RRR, no m/r/g Respiratory: CTAB, normal WOB, no w/c/r GI: Soft, NTTP, non-distended  MSK/neuro: Intact sensation bilaterally in the lower extremity.  3 out of 5 strength in the upper and lower extremity on the left secondary to her CVA.  Decreased range of motion also noted on the left side.  ASSESSMENT/PLAN:   Assessment & Plan Urinary incontinence, unspecified type - Secondary to poor mobility due to her recent stroke and left hemiplegia. -Incontinence  supplies ordered -Will route this encounter to RN team for coordination   Lucie Pinal, DO [facility]

## 2024-02-09 NOTE — Assessment & Plan Note (Signed)
-   Secondary to poor mobility due to her recent stroke and left hemiplegia. -Incontinence  supplies ordered -Will route this encounter to RN team for coordination

## 2024-02-12 ENCOUNTER — Telehealth: Payer: Self-pay

## 2024-02-12 NOTE — Telephone Encounter (Signed)
 DME order for incontinence supplies sent to [employer] for processing.

## 2024-02-13 ENCOUNTER — Telehealth: Payer: Self-pay

## 2024-02-13 ENCOUNTER — Other Ambulatory Visit (HOSPITAL_COMMUNITY): Payer: Self-pay

## 2024-02-13 NOTE — Telephone Encounter (Signed)
Patient is requesting a refill on Oxycodone. 

## 2024-02-15 ENCOUNTER — Other Ambulatory Visit (HOSPITAL_COMMUNITY): Payer: Self-pay

## 2024-02-15 NOTE — Telephone Encounter (Signed)
 Patient is calling back about refill on pain medication.  Please call patient [phone].

## 2024-02-16 ENCOUNTER — Encounter: Payer: Self-pay | Admitting: Physical Medicine and Rehabilitation

## 2024-02-16 ENCOUNTER — Encounter: Admitting: Physical Medicine and Rehabilitation

## 2024-02-16 ENCOUNTER — Other Ambulatory Visit (HOSPITAL_COMMUNITY): Payer: Self-pay

## 2024-02-16 VITALS — BP 159/84 | HR 80 | Wt 140.8 lb

## 2024-02-16 DIAGNOSIS — G894 Chronic pain syndrome: Secondary | ICD-10-CM

## 2024-02-16 DIAGNOSIS — Z79899 Other long term (current) drug therapy: Secondary | ICD-10-CM

## 2024-02-16 DIAGNOSIS — Z5181 Encounter for therapeutic drug level monitoring: Secondary | ICD-10-CM

## 2024-02-16 DIAGNOSIS — G8114 Spastic hemiplegia affecting left nondominant side: Secondary | ICD-10-CM | POA: Diagnosis not present

## 2024-02-16 MED ORDER — PREGABALIN 25 MG PO CAPS
25.0000 mg | ORAL_CAPSULE | Freq: Three times a day (TID) | ORAL | 3 refills | Status: DC
  Filled 2024-02-16: qty 90, 30d supply, fill #0

## 2024-02-16 NOTE — Progress Notes (Signed)
 Subjective:    Patient ID: [name], female    DOB: 09/27/1975, 48 y.o.   MRN: [id number]  HPI [name] is a 48 year old woman who presents for follow-up of CVA  1) CVA -she would benefit from home health -she is receiving support from her ather -she is able to walk to and from the bathroom easily.  -AFO causing her pain, she feels she walks better without it -she has been receiving home therapy  2) HTN: -BP is 147/96 today  3) Anxiety:  -she needs refill of hydroxyzine   4) insomnia: -needs refill of her trazoodone  5) Peripheral neuropathy:  -pain is present in both feet -feels like burning -asks for tylenol  #3  6) Muscle spasms: -would like muscle relaxer   7) Left sided neuropathic pain: -oxycodone  provided her some relief -failed gabapentin , Qutenza , has not tried Lyrica  Yesterday she walked around the whole [employer] and she was crying due to the aching pain   Pain Inventory Average Pain 10 Pain Right Now 10 My pain is constant, sharp, tingling, and aching   In the last 24 hours, has pain interfered with the following? General activity 7 Relation with others 7 Enjoyment of life 7 What TIME of day is your pain at its worst? morning , daytime, evening, and night Sleep (in general) Poor   Pain is worse with: walking Pain improves with: medication Relief from Meds: 0   Family History  Problem Relation Age of Onset   Diabetes Mother    Hypertension Mother    Hypertension Father    Diabetes Father    Social History   Socioeconomic History   Marital status: Single    Spouse name: Not on file   Number of children: Not on file   Years of education: Not on file   Highest education level: Not on file  Occupational History   Not on file  Tobacco Use   Smoking status: Never   Smokeless tobacco: Never   Tobacco comments:    Never smoke 10/22/21  Vaping Use   Vaping status: Never Used  Substance and Sexual Activity   Alcohol use: No     Alcohol/week: 0.0 standard drinks of alcohol   Drug use: No   Sexual activity: Not Currently    Birth control/protection: None  Other Topics Concern   Not on file  Social History Narrative   Are you right handed or left handed? right   Are you currently employed ?    What is your current occupation?   Do you live at home alone?   Who lives with you?    What type of home do you live in: 1 story or 2 story?        Social Drivers of Corporate investment banker Strain: Low Risk  (04/24/2023)   [facility]   Overall Financial Resource Strain (CARDIA)    Difficulty of Paying Living Expenses: Not hard at all  Food Insecurity: No Food Insecurity (12/27/2023)   Hunger Vital Sign    Worried About Running Out of Food in the Last Year: Never true    Ran Out of Food in the Last Year: Never true  Transportation Needs: No Transportation Needs (12/27/2023)   PRAPARE - Administrator, Civil Service (Medical): No    Lack of Transportation (Non-Medical): No  Physical Activity: Inactive (04/04/2023)   Exercise Vital Sign    Days of Exercise per Week: 0 days  Minutes of Exercise per Session: 0 min  Stress: Stress Concern Present (04/04/2023)   [employer] of Occupational Health - Occupational Stress Questionnaire    Feeling of Stress : Very much  Social Connections: Socially Isolated (04/04/2023)   Social Connection and Isolation Panel    Frequency of Communication with Friends and Family: Once a week    Frequency of Social Gatherings with Friends and Family: Never    Attends Religious Services: Never    Database administrator or Organizations: No    Attends Banker Meetings: Never    Marital Status: Never married   Past Surgical History:  Procedure Laterality Date   BIOPSY  11/05/2021   Procedure: BIOPSY;  Surgeon: Stacia Glendia BRAVO, MD;  Location: [facility] ENDOSCOPY;  Service: Gastroenterology;;   CERVIX LESION DESTRUCTION     CESAREAN SECTION      ESOPHAGOGASTRODUODENOSCOPY (EGD) WITH PROPOFOL  N/A 11/05/2021   Procedure: ESOPHAGOGASTRODUODENOSCOPY (EGD) WITH PROPOFOL ;  Surgeon: Stacia Glendia BRAVO, MD;  Location: [facility] ENDOSCOPY;  Service: Gastroenterology;  Laterality: N/A;   IR CT HEAD LTD  09/24/2022   IR PERCUTANEOUS ART THROMBECTOMY/INFUSION INTRACRANIAL INC DIAG ANGIO  09/24/2022   RADIOLOGY WITH ANESTHESIA N/A 09/24/2022   Procedure: IR WITH ANESTHESIA;  Surgeon: Radiologist, Medication, MD;  Location: [facility] OR;  Service: Radiology;  Laterality: N/A;   RIGHT/LEFT HEART CATH AND CORONARY ANGIOGRAPHY N/A 03/29/2019   Procedure: RIGHT/LEFT HEART CATH AND CORONARY ANGIOGRAPHY;  Surgeon: Rolan Ezra RAMAN, MD;  Location: [facility] INVASIVE CV LAB;  Service: Cardiovascular;  Laterality: N/A;   RIGHT/LEFT HEART CATH AND CORONARY ANGIOGRAPHY N/A 09/20/2021   Procedure: RIGHT/LEFT HEART CATH AND CORONARY ANGIOGRAPHY;  Surgeon: Rolan Ezra RAMAN, MD;  Location: [facility] INVASIVE CV LAB;  Service: Cardiovascular;  Laterality: N/A;   Past Medical History:  Diagnosis Date   Abnormal Pap smear    Acute combined systolic and diastolic heart failure (HCC) 02/19/2019   Wt Readings from Last 3 Encounters:  09/18/21  84.7 kg  09/02/21  84.4 kg  08/31/21  84.2 kg         Acute heart failure (HCC) 01/29/2022   Acute on chronic combined systolic and diastolic CHF (congestive heart failure) (HCC) 02/19/2019   Acute on chronic congestive heart failure (HCC)    Acute on chronic congestive heart failure (HCC)    Acute on chronic diastolic CHF (congestive heart failure) (HCC) 10/19/2021   Acute on chronic systolic heart failure (HCC) 02/22/2022   Anemia    Asthma    Atrial fibrillation (HCC)    history of paroxysmal atrial fibrillation   CHF (congestive heart failure) (HCC) 11/02/2021   CHF exacerbation (HCC) 10/17/2021   Diabetes mellitus    DKA, type 2 (HCC) 02/06/2019   Elevated troponin 09/30/2019   Heart failure with reduced ejection fraction (HCC)    History of Left  ventricular thrombus 09/22/2021   Hydrosalpinx 08/21/2009   Dx on Pelvic US  in Jan 2011.  Consider chronic etiology given continued fertility issues   Hypertension    Hypertensive emergency 09/30/2019   Hypertensive urgency 06/02/2022   Major depressive disorder 02/08/2012   Reports that her infertility is a major cause of her depression Reports Prozac  has worked in the past for her.     Migraines    Morbid obesity (HCC) 02/06/2019   Nausea & vomiting 06/26/2022   Nonischemic cardiomyopathy (HCC) 09/22/2021   NSTEMI (non-ST elevated myocardial infarction) (HCC) 02/23/2022   Obstructive sleep apnea 06/30/2015   Polycystic ovarian disease  There were no vitals taken for this visit.  Opioid Risk Score:   Fall Risk Score:  `1  Depression screen [facility] 2/9     02/09/2024   11:43 AM 12/27/2023    3:55 PM 11/01/2023    1:11 PM 07/27/2023    2:59 PM 07/04/2023   10:56 AM 04/04/2023   10:32 AM 02/21/2023   10:45 AM  Depression screen PHQ 2/9  Decreased Interest 1 3 2 3 1 3 2   Down, Depressed, Hopeless 1 2 0 3 1 3 2   PHQ - 2 Score 2 5 2 6 2 6 4   Altered sleeping 2 0 3 3  3  0  Tired, decreased energy 1 1 2 3  3 1   Change in appetite 0 0 0 0  0 0  Feeling bad or failure about yourself  0 1 1 2  2 3   Trouble concentrating 0 0 0 2  2 2   Moving slowly or fidgety/restless 0 0 0 2  0 0  Suicidal thoughts 0 -- 0 0  0 1  PHQ-9 Score 5 7 8 18  16 11   Difficult doing work/chores Very difficult   Very difficult  Very difficult      Review of Systems  Gastrointestinal:  Positive for constipation.  Musculoskeletal:  Positive for gait problem.       Lt arm pain, pain in both feet, left leg   All other systems reviewed and are negative.      Objective:   Physical Exam Gen: no distress, normal appearing HEENT: oral mucosa pink and moist, NCAT Cardio: Reg rate Chest: normal effort, normal rate of breathing Abd: soft, non-distended Ext: no edema Psych: pleasant, normal affect Skin:  intact Neuro: Alert and oriented x3, left sided weakness       Assessment & Plan:  1) CVA continue home therapy -provided information regarding [facility] program Prescribing Home Zynex NexWave Stimulator Device and supplies as needed. IFC, NMES and TENS medically necessary Treatment Rx: Daily @ 30-40 minutes per treatment PRN. Zynex NexWave only, no substitutions. Treatment Goals: 1) To reduce and/or eliminate pain 2) To improve functional capacity and Activities of daily living 3) To reduce or prevent the need for oral medications 4) To improve circulation in the injured region 5) To decrease or prevent muscle spasm and muscle atrophy 6) To provide a self-management tool to the patient The patient has not sufficiently improved with conservative care. Numerous studies indexed by Medline and PubMed.gov have shown Neuromuscular, Interferential, and TENS stimulators to reduce pain, improve function, and reduce medication use in injured patients. Continued use of this evidence based, safe, drug free treatment is both reasonable and medically necessary at this time.   2) Anxiety: -prescribed hydroxyzine   3) Insomnia: -trazodone  prescribed  4) HTN: -continue home blood pressure medications -Advised checking BP daily at home and logging results to bring into follow-up appointment with PCP and myself. -Reviewed BP meds today.  -Advised regarding healthy foods that can help lower blood pressure and provided with a list: 1) citrus foods- high in vitamins and minerals 2) salmon and other fatty fish - reduces inflammation and oxylipins 3) swiss chard (leafy green)- high level of nitrates 4) pumpkin seeds- one of the best natural sources of magnesium  5) Beans and lentils- high in fiber, magnesium , and potassium 6) Berries- high in flavonoids 7) Amaranth (whole grain, can be cooked similarly to rice and oats)- high in magnesium  and fiber 8) Pistachios- even more effective at reducing  BP than  other nuts 9) Carrots- high in phenolic compounds that relax blood vessels and reduce inflammation 10) Celery- contain phthalides that relax tissues of arterial walls 11) Tomatoes- can also improve cholesterol and reduce risk of heart disease 12) Broccoli- good source of magnesium , calcium , and potassium 13) Greek yogurt: high in potassium and calcium  14) Herbs and spices: Celery seed, cilantro, saffron, lemongrass, black cumin, ginseng, cinnamon, cardamom, sweet basil, and ginger 15) Chia and flax seeds- also help to lower cholesterol and blood sugar 16) Beets- high levels of nitrates that relax blood vessels  17) spinach and bananas- high in potassium  -Provided lise of supplements that can help with hypertension:  1) magnesium : one high quality brand is Bioptemizers since it contains all 7 types of magnesium , otherwise over the counter magnesium  gluconate 400mg  is a good option 2) B vitamins 3) vitamin D  4) potassium 5) CoQ10 6) L-arginine 7) Vitamin C 8) Beetroot -Educated that goal BP is 120/80. -Made goal to incorporate some of the above foods into diet.    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 Discussed that this is a capsaicin  patch, stronger than capsaicin  cream. Discussed that it is currently approved for diabetic peripheral neuropathy and post-herpetic neuralgia, but that it has also shown benefit in treating other forms of neuropathy. Provided patient with link to site to learn more about the patch: https://www.qutenza .com/. Discussed that the patch would be placed in office and benefits usually last 3 months. Discussed that unintended exposure to capsaicin  can cause severe irritation of eyes, mucous membranes, respiratory tract, and skin, but that Qutenza  is a local treatment and does not have the systemic side effects of other nerve medications. Discussed that there may be pain,  itching, erythema, and decreased sensory function associated with the application of Qutenza . Side effects usually subside within 1 week. A cold pack of analgesic medications can help with these side effects. Blood pressure can also be increased due to pain associated with administration of the patch.   Prescribing Home Zynex NexWave Stimulator Device and supplies as needed. IFC, NMES and TENS medically necessary Treatment Rx: Daily @ 30-40 minutes per treatment PRN. Zynex NexWave only, no substitutions. Treatment Goals: 1) To reduce and/or eliminate pain 2) To improve functional capacity and Activities of daily living 3) To reduce or prevent the need for oral medications 4) To improve circulation in the injured region 5) To decrease or prevent muscle spasm and muscle atrophy 6) To provide a self-management tool to the patient The patient has not sufficiently improved with conservative care. Numerous studies indexed by Medline and PubMed.gov have shown Neuromuscular, Interferential, and TENS stimulators to reduce pain, improve function, and reduce medication use in injured patients. Continued use of this evidence based, safe, drug free treatment is both reasonable and medically necessary at this time.   Pain contract and urine sample performed today  -glucose meter and supplies ordered  6) Muscle spasms:  -flexeril  ordered prn  7) chronic left shoulder pain: -oxycodone  switched to tylenol  #3 as per patient's request -f/u with Fidela on the 3rd and can switch back if tylenol  #3 is not effective -lidocaine  patch prescribed

## 2024-02-16 NOTE — Addendum Note (Signed)
 Addended by: LORILEE SVEN SQUIBB on: 02/16/2024 05:23 PM   Modules accepted: Level of Service

## 2024-02-21 ENCOUNTER — Encounter: Payer: Self-pay | Admitting: Licensed Clinical Social Worker

## 2024-02-21 ENCOUNTER — Telehealth: Payer: Self-pay | Admitting: Licensed Clinical Social Worker

## 2024-02-21 NOTE — Patient Instructions (Signed)
 [name] - I am sorry I was unable to reach you today for our scheduled appointment. I work with Sports coach, Kirstie, DO and am calling to support your healthcare needs. Please contact me at [phone] at your earliest convenience. I look forward to speaking with you soon.   Thank you,  Rolin Kerns, LCSW [place]  [facility], [facility] Clinical Social Worker Direct Dial: [phone]  Fax: [phone] Website: [email or web address] 5:16 PM

## 2024-02-27 ENCOUNTER — Encounter: Attending: Physical Medicine and Rehabilitation | Admitting: Physical Medicine & Rehabilitation

## 2024-02-27 ENCOUNTER — Encounter: Payer: Self-pay | Admitting: Physical Medicine & Rehabilitation

## 2024-02-27 VITALS — BP 139/90 | HR 78 | Ht 59.0 in | Wt 138.0 lb

## 2024-02-27 DIAGNOSIS — I639 Cerebral infarction, unspecified: Secondary | ICD-10-CM | POA: Insufficient documentation

## 2024-02-27 DIAGNOSIS — F5101 Primary insomnia: Secondary | ICD-10-CM | POA: Insufficient documentation

## 2024-02-27 DIAGNOSIS — F419 Anxiety disorder, unspecified: Secondary | ICD-10-CM | POA: Insufficient documentation

## 2024-02-27 DIAGNOSIS — G8114 Spastic hemiplegia affecting left nondominant side: Secondary | ICD-10-CM | POA: Insufficient documentation

## 2024-02-27 DIAGNOSIS — M62838 Other muscle spasm: Secondary | ICD-10-CM | POA: Diagnosis present

## 2024-02-27 MED ORDER — ONABOTULINUMTOXINA 100 UNITS IJ SOLR
400.0000 [IU] | Freq: Once | INTRAMUSCULAR | Status: AC
  Administered 2024-02-27: 400 [IU] via INTRAMUSCULAR

## 2024-02-27 MED ORDER — SODIUM CHLORIDE (PF) 0.9 % IJ SOLN
8.0000 mL | Freq: Once | INTRAMUSCULAR | Status: AC
  Administered 2024-02-27: 8 mL via INTRAVENOUS

## 2024-02-27 NOTE — Progress Notes (Signed)
 Botox  Injection for spasticity using needle EMG guidance  Dilution: 50 Units/ml Indication: Severe spasticity which interferes with ADL,mobility and/or  hygiene and is unresponsive to medication management and other conservative care Informed consent was obtained after describing risks and benefits of the procedure with the patient. This includes bleeding, bruising, infection, excessive weakness, or medication side effects. A REMS form is on file and signed. Needle: 27g 1 for UE and 25 g 2 for LE needle electrode Number of units per muscle Botox  400 units total Left upper limb Biceps 75 units Brachialis 75 units FCR 50 units FDS 50 units FDP 50 units FPL 25 units  Left lower limb Posterior tibialis 75 units All injections were done after obtaining appropriate EMG activity and after negative drawback for blood. The patient tolerated the procedure well. Post procedure instructions were given. A followup appointment was made.

## 2024-02-27 NOTE — Patient Instructions (Signed)

## 2024-02-28 ENCOUNTER — Telehealth: Payer: Self-pay

## 2024-02-28 NOTE — Telephone Encounter (Signed)
 Patient calls nurse line requesting information for social worker.   Advised that Jasmine attempted to reach her last week on 02/21/24.  Provided patient with contact information for Rolin Kerns, LCSW.   Patient appreciative.   Chiquita JAYSON English, RN

## 2024-02-29 ENCOUNTER — Encounter (HOSPITAL_COMMUNITY): Payer: Self-pay | Admitting: Emergency Medicine

## 2024-02-29 ENCOUNTER — Emergency Department (HOSPITAL_COMMUNITY)
Admission: EM | Admit: 2024-02-29 | Discharge: 2024-02-29 | Disposition: A | Discharge status: Home / Self Care | Attending: Emergency Medicine | Admitting: Emergency Medicine

## 2024-02-29 ENCOUNTER — Other Ambulatory Visit: Payer: Self-pay

## 2024-02-29 ENCOUNTER — Emergency Department (HOSPITAL_COMMUNITY)

## 2024-02-29 DIAGNOSIS — R202 Paresthesia of skin: Secondary | ICD-10-CM | POA: Insufficient documentation

## 2024-02-29 DIAGNOSIS — R1013 Epigastric pain: Secondary | ICD-10-CM | POA: Insufficient documentation

## 2024-02-29 DIAGNOSIS — E119 Type 2 diabetes mellitus without complications: Secondary | ICD-10-CM | POA: Insufficient documentation

## 2024-02-29 DIAGNOSIS — Z7984 Long term (current) use of oral hypoglycemic drugs: Secondary | ICD-10-CM | POA: Insufficient documentation

## 2024-02-29 DIAGNOSIS — R0789 Other chest pain: Secondary | ICD-10-CM | POA: Insufficient documentation

## 2024-02-29 DIAGNOSIS — R1031 Right lower quadrant pain: Secondary | ICD-10-CM | POA: Diagnosis not present

## 2024-02-29 DIAGNOSIS — R1032 Left lower quadrant pain: Secondary | ICD-10-CM | POA: Diagnosis not present

## 2024-02-29 DIAGNOSIS — R531 Weakness: Secondary | ICD-10-CM | POA: Insufficient documentation

## 2024-02-29 DIAGNOSIS — I509 Heart failure, unspecified: Secondary | ICD-10-CM | POA: Insufficient documentation

## 2024-02-29 DIAGNOSIS — Z8673 Personal history of transient ischemic attack (TIA), and cerebral infarction without residual deficits: Secondary | ICD-10-CM | POA: Insufficient documentation

## 2024-02-29 LAB — COMPREHENSIVE METABOLIC PANEL WITH GFR
ALT: 8 U/L (ref 0–44)
AST: 20 U/L (ref 15–41)
Albumin: 3.5 g/dL (ref 3.5–5.0)
Alkaline Phosphatase: 95 U/L (ref 38–126)
Anion gap: 10 (ref 5–15)
BUN: 10 mg/dL (ref 6–20)
CO2: 27 mmol/L (ref 22–32)
Calcium: 9.5 mg/dL (ref 8.9–10.3)
Chloride: 100 mmol/L (ref 98–111)
Creatinine, Ser: 0.77 mg/dL (ref 0.44–1.00)
GFR, Estimated: >60 mL/min (ref >60)
Glucose, Bld: 106 mg/dL — ABNORMAL HIGH (ref 70–99)
Potassium: 3.9 mmol/L (ref 3.5–5.1)
Sodium: 137 mmol/L (ref 135–145)
Total Bilirubin: 1.8 mg/dL — ABNORMAL HIGH (ref 0.0–1.2)
Total Protein: 8 g/dL (ref 6.5–8.1)

## 2024-02-29 LAB — LIPASE, BLOOD: Lipase: 28 U/L (ref 11–51)

## 2024-02-29 LAB — TROPONIN I (HIGH SENSITIVITY): Troponin I (High Sensitivity): 7 ng/L (ref <18)

## 2024-02-29 MED ORDER — DIAZEPAM 5 MG/ML IJ SOLN
2.0000 mg | Freq: Once | INTRAMUSCULAR | Status: AC
  Administered 2024-02-29: 2 mg via INTRAVENOUS
  Filled 2024-02-29: qty 2

## 2024-02-29 MED ORDER — MORPHINE SULFATE (PF) 4 MG/ML IV SOLN
4.0000 mg | Freq: Once | INTRAVENOUS | Status: AC
  Administered 2024-02-29: 4 mg via INTRAVENOUS
  Filled 2024-02-29: qty 1

## 2024-02-29 MED ORDER — ONDANSETRON HCL 4 MG/2ML IJ SOLN
4.0000 mg | Freq: Once | INTRAMUSCULAR | Status: AC
  Administered 2024-02-29: 4 mg via INTRAVENOUS
  Filled 2024-02-29: qty 2

## 2024-02-29 NOTE — ED Triage Notes (Signed)
 Pt BIB EMS from home, c/f stroke. Felt abnormal sensations in right arm since 11am. Stroke screen clear, previous stroke affecting left side. Pt w/c bound at baseline. BP 130/80, VSS

## 2024-02-29 NOTE — ED Notes (Signed)
 Urine specimen on sink if needed.

## 2024-02-29 NOTE — Discharge Instructions (Addendum)
 Your bloodwork, EKG, and chest x-ray did not reveal any cause for your symptoms. Please continue your current medications and follow up with your primary care physician Please seek medical attention if you experience any worsening symptoms including chest pain, difficulty breathing, numbness or weakness in your arms/legs, difficulty speaking or swallowing

## 2024-02-29 NOTE — ED Provider Notes (Addendum)
 [facility] AT [facility] Provider Note   CSN: [id number] Arrival date & time: 02/29/24  1623     Patient presents with: Extremity Weakness   [name] is a 48 y.o. female.   48 year old female with history of T2DM, PAF, nonischemic cardiomyopathy, history of CVA in 2024, CHF presenting due to central chest pain/pressure radiating into her right arm with some right-sided weakness.  Her symptoms have been ongoing for a few days but worse this morning.  She has not tried taking any medication for her pain but reports taking all her home meds today. Also endorses some nausea but no vomiting. Endorses mild SOB. Endorses headache but reports chronic migraines, this feels similar. Endorses diffuse abdominal pain. 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   Extremity Weakness Associated symptoms include chest pain, abdominal pain, headaches and shortness of breath.        Prior to Admission medications   Medication Sig Start Date End Date Taking? Authorizing Provider  Accu-Chek Softclix Lancets lancets USE 1  TO CHECK GLUCOSE 4 TIMES DAILY 01/12/23   Jeanelle Layman CROME, MD  Accu-Chek Softclix Lancets lancets Use each in the morning, at noon, and at bedtime. 07/04/23   Raulkar, Sven SQUIBB, MD  acetaminophen  (TYLENOL ) 500 MG tablet Take 1-2 tablets (500-1,000 mg total) by mouth every 6 (six) hours as needed for mild pain (pain score 1-3) or headache (OR CRAMPING). 01/31/24   Leigh Venetia CROME, MD  acetaminophen -codeine  (TYLENOL  #3) 300-30 MG tablet Take 1 tablet by mouth 2 (two) times daily as needed for moderate pain (pain score 4-6). 08/31/23   Raulkar, Sven SQUIBB, MD  albuterol  (VENTOLIN  HFA) 108 (90 Base) MCG/ACT inhaler Inhale 2 puffs into the lungs every 6 (six) hours as needed for wheezing or shortness of breath. 01/23/23   Christia Budds, MD  Atogepant  (QULIPTA ) 30 MG TABS Take 1 tablet (30 mg  total) by mouth daily. 01/31/24   Leigh Venetia CROME, MD  atorvastatin  (LIPITOR ) 80 MG tablet Take 1 tablet (80 mg total) by mouth daily after supper. 04/17/23   McDiarmid, Krystal BIRCH, MD  bisacodyl  5 MG EC tablet Take 2 tablets (10 mg total) by mouth every 24 hours as needed for constipation. 06/02/23     blood glucose meter kit and supplies Use up to 4 (four) times daily as directed 07/04/23   Raulkar, Sven SQUIBB, MD  Blood Glucose Monitoring Suppl (ACCU-CHEK GUIDE) w/Device KIT Use up to 4 (four) times daily. 07/04/23   Raulkar, Sven SQUIBB, MD  Cholecalciferol  (VITAMIN D ) 125 MCG (5000 UT) CAPS Take one capsule by mouth once daily. 04/17/23   McDiarmid, Krystal BIRCH, MD  dabigatran  (PRADAXA ) 150 MG CAPS capsule Take 1 capsule (150 mg total) by mouth 2 (two) times daily for anticoagulant. 09/28/23   Christia Budds, MD  empagliflozin  (JARDIANCE ) 25 MG TABS tablet Take 1 tablet (25 mg total) by mouth daily. 04/17/23   McDiarmid, Krystal BIRCH, MD  escitalopram  (LEXAPRO ) 20 MG tablet Take 1 tablet (20 mg total) by mouth at bedtime. 08/28/23   Christia Budds, MD  gabapentin  (NEURONTIN ) 300 MG capsule Take 1 capsule (300 mg total) by mouth at bedtime. 01/11/24   Christia Budds, MD  glipiZIDE  (GLUCOTROL ) 5 MG tablet Take 1 tablet (5 mg total) by mouth daily before breakfast. 04/17/23   McDiarmid, Krystal BIRCH, MD  glucose blood (ACCU-CHEK GUIDE) test strip USE  4 TIMES DAILY 01/12/23   Hardy,  Anupa, DO  hydrOXYzine  (ATARAX ) 10 MG tablet Take 1 tablet (10 mg total) by mouth every 8 (eight) hours as needed for itching. 06/02/23     hydrOXYzine  (ATARAX ) 10 MG tablet Take 1 tablet (10 mg total) by mouth 3 (three) times daily as needed for anxiety. 07/27/23   Christia Budds, MD  levETIRAcetam  (KEPPRA ) 750 MG tablet Take 1 tablet (750 mg total) by mouth 2 (two) times daily for seizures. 06/02/23     lidocaine  (LIDODERM ) 5 % Place 1 patch onto the skin daily. Remove & Discard patch within 12 hours or as directed by MD 08/31/23   Raulkar, Sven SQUIBB, MD   losartan  (COZAAR ) 50 MG tablet Take 1 tablet (50 mg total) by mouth daily. 01/11/24   Christia Budds, MD  melatonin 5 MG TABS Take 1 tablet (5 mg total) by mouth at bedtime. 01/11/24   Christia Budds, MD  metoprolol  tartrate (LOPRESSOR ) 25 MG tablet Take 1 tablet (25 mg total) by mouth 2 (two) times daily. 04/17/23   McDiarmid, Krystal BIRCH, MD  pregabalin  (LYRICA ) 25 MG capsule Take 1 capsule (25 mg total) by mouth 3 (three) times daily. 02/16/24   Raulkar, Sven SQUIBB, MD  spironolactone  (ALDACTONE ) 25 MG tablet Take 1 tablet (25 mg total) by mouth daily. 04/17/23   McDiarmid, Krystal BIRCH, MD  tiZANidine  (ZANAFLEX ) 4 MG tablet Take 1 tablet (4 mg total) by mouth every 6 (six) hours as needed for muscle spasms. 02/06/24   Everhart, Kirstie, DO  torsemide  (DEMADEX ) 20 MG tablet Take 1 tablet (20 mg total) by mouth daily. 01/20/23   Christia Budds, MD  traZODone  (DESYREL ) 150 MG tablet Take 1 tablet (150 mg total) by mouth at bedtime for insomnia. 02/06/24   Everhart, Kirstie, DO  Ubrogepant  (UBRELVY ) 100 MG TABS Take 1 tablet (100 mg total) by mouth daily as needed. 08/03/23   Leigh Venetia CROME, MD  carvedilol  (COREG ) 3.125 MG tablet Take 1 tablet (3.125 mg total) by mouth 2 (two) times daily with a meal. 02/27/22 07/01/22  Drusilla Sabas RAMAN, MD    Allergies: Fish allergy, Shellfish allergy, Metformin , Metformin  and related, Nsaids, and Trulicity  [dulaglutide ]    Review of Systems  Constitutional:  Negative for chills, diaphoresis and fever.  Respiratory:  Positive for shortness of breath. Negative for cough.   Cardiovascular:  Positive for chest pain. Negative for leg swelling.  Gastrointestinal:  Positive for abdominal pain and nausea. Negative for vomiting.  Genitourinary:  Negative for dysuria and hematuria.  Musculoskeletal:  Positive for extremity weakness.  Neurological:  Positive for headaches. Negative for numbness.    Updated Vital Signs BP (!) 163/102 (BP Location: Right Wrist)   Pulse 63   Temp 98.3 F  (36.8 C) (Oral)   Resp 12   SpO2 100%   Physical Exam Constitutional:      General: She is not in acute distress. HENT:     Head: Normocephalic and atraumatic.  Eyes:     Extraocular Movements: Extraocular movements intact.     Conjunctiva/sclera: Conjunctivae normal.     Pupils: Pupils are equal, round, and reactive to light.  Cardiovascular:     Rate and Rhythm: Normal rate and regular rhythm.     Heart sounds: Normal heart sounds. No murmur heard. Pulmonary:     Effort: Pulmonary effort is normal. No respiratory distress.     Breath sounds: Normal breath sounds.  Chest:     Chest wall: Tenderness present.     Comments: TTP diffusely across  chest wall Abdominal:     General: Abdomen is flat. There is no distension.     Palpations: Abdomen is soft.     Tenderness: There is abdominal tenderness. There is no guarding or rebound.     Comments: RLQ, LLQ, epigastric  Musculoskeletal:        General: No swelling or deformity.     Cervical back: Neck supple. No rigidity or tenderness.     Right lower leg: No edema.     Left lower leg: No edema.  Neurological:     General: No focal deficit present.     Mental Status: She is alert. Mental status is at baseline.     Cranial Nerves: Cranial nerves 2-12 are intact. No cranial nerve deficit.     Sensory: Sensation is intact. No sensory deficit.     Motor: Motor function is intact. No weakness.     Comments: L hemiplegia from prior stroke. 3-4/5 strength LLE. Unable to move LUE which is contracted  Psychiatric:     Comments: Anxious     (all labs ordered are listed, but only abnormal results are displayed) Labs Reviewed  COMPREHENSIVE METABOLIC PANEL WITH GFR - Abnormal; Notable for the following components:      Result Value   Glucose, Bld 106 (*)    Total Bilirubin 1.8 (*)    All other components within normal limits  LIPASE, BLOOD  CBC WITH DIFFERENTIAL/PLATELET  CBC WITH DIFFERENTIAL/PLATELET  TROPONIN I (HIGH  SENSITIVITY)    EKG: None  Radiology: DG Chest 2 View Result Date: 02/29/2024 CLINICAL DATA:  Chest pain, weakness EXAM: CHEST - 2 VIEW COMPARISON:  08/19/2023 FINDINGS: Frontal and lateral views of the chest demonstrates stable enlargement of the cardiac silhouette. No acute airspace disease, effusion, or pneumothorax. No acute bony abnormality. IMPRESSION: 1. Stable chest, no acute process. Electronically Signed   By: Ozell Daring M.D.   On: 02/29/2024 19:02     Procedures   Medications Ordered in the ED  morphine  (PF) 4 MG/ML injection 4 mg (4 mg Intravenous Given 02/29/24 1830)  ondansetron  (ZOFRAN ) injection 4 mg (4 mg Intravenous Given 02/29/24 1835)  diazepam  (VALIUM ) injection 2 mg (2 mg Intravenous Given 02/29/24 1828)                                    Medical Decision Making 48 year old female with history of CVA in 2024, nonischemic cardiomyopathy, CHF, T2DM presenting due to 2 day history of central chest pain radiating into right arm as well as headaches, abdominal pain, nausea, mild shortness of breath. Most of her symptoms appear chronic in nature and her chest pain has been ongoing for several days She does have chronic chest wall pain and her pain is reproducible with palpation of her chest wall  Her neurologic exam is unremarkable aside from her chronic left hemiplegia from her stroke in 2024, appears stable per chart review.  Low suspicion for acute stroke or TIA, do not feel head imaging is warranted Will workup for atypical ACS given her significant risk factors with EKG, chest x-ray, troponin.  Also obtain CMP, lipase, given her reports abdominal pain. Suspect her symptoms are largely MSK related and chronic Also suspect significant underlying anxiety contributing, understandable given her history at her age Provide morphine  for pain and Zofran  for nausea. 2mg  IV valium  for anxiety and as muscle relaxer for MSK pain and headache  8:29  PM EKG shows NSR without signs  of acute ischemia or arrhythmia CXR without acute abnormalities Troponin WNL CMP unremarkable, CBC pending - per RN attempted to redraw CBC but unable to obtain, pt declined further attempts Reevaluated patient who reports some improvement in her symptoms after medications. She is a bit anxious about going home but would feel ok with discharge. Discussed return precautions  Amount and/or Complexity of Data Reviewed Labs: ordered. Radiology: ordered.  Risk Prescription drug management.       Final diagnoses:  Chest wall pain    ED Discharge Orders     None          Romelle Booty, MD 02/29/24 2050    Romelle Booty, MD 02/29/24 2050    Romelle Booty, MD 02/29/24 7947    Dasie Faden, MD 02/29/24 2201

## 2024-02-29 NOTE — ED Provider Notes (Signed)
 I provided a substantive portion of the care of this patient.  I personally made/approved the management plan for this patient and take responsibility for the patient management.      48 year old female presents with 2 days of reproducible anterior chest wall pain.  She is also noted some paresthesias to her right upper extremity.  Does have history of CVA in the past.  Her EKG per interpretation shows no acute findings.  Sinus rhythm.  Low suspicion for ACS.  Will treat her muscle pain at this time.  Do not feel that she needs to have an intracranial imaging.  Will need to follow-up with her doctor   Dasie Faden, MD 02/29/24 [phone]

## 2024-03-01 ENCOUNTER — Other Ambulatory Visit: Payer: Self-pay | Admitting: Licensed Clinical Social Worker

## 2024-03-01 NOTE — Patient Outreach (Signed)
 Complex Care Management   Visit Note  03/01/2024  Name:  [name] MRN: [id number] DOB: 16-Aug-1975  Situation: Referral received for Complex Care Management related to Mental/Behavioral Health diagnosis MDD I obtained verbal consent from Patient.  Visit completed with pt  on the phone  Background:   Past Medical History:  Diagnosis Date   Abnormal Pap smear    Acute combined systolic and diastolic heart failure (HCC) 02/19/2019   Wt Readings from Last 3 Encounters:  09/18/21  84.7 kg  09/02/21  84.4 kg  08/31/21  84.2 kg         Acute heart failure (HCC) 01/29/2022   Acute on chronic combined systolic and diastolic CHF (congestive heart failure) (HCC) 02/19/2019   Acute on chronic congestive heart failure (HCC)    Acute on chronic congestive heart failure (HCC)    Acute on chronic diastolic CHF (congestive heart failure) (HCC) 10/19/2021   Acute on chronic systolic heart failure (HCC) 02/22/2022   Anemia    Asthma    Atrial fibrillation (HCC)    history of paroxysmal atrial fibrillation   CHF (congestive heart failure) (HCC) 11/02/2021   CHF exacerbation (HCC) 10/17/2021   Diabetes mellitus    DKA, type 2 (HCC) 02/06/2019   Elevated troponin 09/30/2019   Heart failure with reduced ejection fraction (HCC)    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  Consider chronic etiology given continued fertility issues   Hypertension    Hypertensive emergency 09/30/2019   Hypertensive urgency 06/02/2022   Major depressive disorder 02/08/2012   Reports that her infertility is a major cause of her depression Reports Prozac  has worked in the past for her.     Migraines    Morbid obesity (HCC) 02/06/2019   Nausea & vomiting 06/26/2022   Nonischemic cardiomyopathy (HCC) 09/22/2021   NSTEMI (non-ST elevated myocardial infarction) (HCC) 02/23/2022   Obstructive sleep apnea 06/30/2015   Polycystic ovarian disease      Assessment: Patient Reported Symptoms:  Cognitive Cognitive Status: Alert and oriented to person, place, and time, Normal speech and language skills, No symptoms reported Cognitive/Intellectual Conditions Management [RPT]: None reported or documented in medical history or problem list   Health Maintenance Behaviors: Annual physical exam  Neurological Neurological Review of Symptoms: Not assessed    HEENT HEENT Symptoms Reported: Not assessed      Cardiovascular Cardiovascular Symptoms Reported: No symptoms reported Cardiovascular Management Strategies: Coping strategies, Routine screening Cardiovascular Comment: Pt recently visited ED due to chest pain.  Respiratory Respiratory Symptoms Reported: No symptoms reported    Endocrine Endocrine Symptoms Reported: No symptoms reported    Gastrointestinal Gastrointestinal Symptoms Reported: No symptoms reported      Genitourinary Genitourinary Symptoms Reported: No symptoms reported    Integumentary Integumentary Symptoms Reported: No symptoms reported    Musculoskeletal Musculoskelatal Symptoms Reviewed: Muscle pain, Unsteady gait, Difficulty walking Musculoskeletal Management Strategies: Routine screening, Medication therapy, Coping strategies      Psychosocial Psychosocial Symptoms Reported: Other Other Psychosocial Conditions: Stress/Chronic Pain Behavioral Management Strategies: Coping strategies, Adequate rest, Medication therapy, Support system Major Change/Loss/Stressor/Fears (CP): Medical condition, self        02/27/2024    1:34 PM  Depression screen PHQ 2/9  Decreased Interest 0  Down, Depressed, Hopeless 0  PHQ - 2 Score 0    There were no vitals filed for this visit.  Medications Reviewed Today     Reviewed by  Kandon Hosking D, LCSW (Social Worker) on 03/01/24 at 1315  Med List Status: <None>   Medication Order Taking? Sig Documenting Provider Last Dose Status Informant  Accu-Chek Softclix Lancets lancets  [id number]  USE 1  TO CHECK GLUCOSE 4 TIMES DAILY Chambliss, Layman CROME, MD  Active Father           Med Note ANGELICA, Anecia Nusbaum D   Wed Nov 01, 2023  1:20 PM) Have to pay out of pocket fees   Accu-Chek Softclix Lancets lancets [id number]  Use each in the morning, at noon, and at bedtime. Lorilee Sven SQUIBB, MD  Active   acetaminophen  (TYLENOL ) 500 MG tablet [id number]  Take 1-2 tablets (500-1,000 mg total) by mouth every 6 (six) hours as needed for mild pain (pain score 1-3) or headache (OR CRAMPING). Leigh Venetia CROME, MD  Active   acetaminophen -codeine  (TYLENOL  #3) 300-30 MG tablet [id number]  Take 1 tablet by mouth 2 (two) times daily as needed for moderate pain (pain score 4-6). Lorilee Sven SQUIBB, MD  Active   albuterol  (VENTOLIN  HFA) 108 (90 Base) MCG/ACT inhaler [id number]  Inhale 2 puffs into the lungs every 6 (six) hours as needed for wheezing or shortness of breath. Christia Budds, MD  Active   Atogepant  (QULIPTA ) 30 MG TABS [id number]  Take 1 tablet (30 mg total) by mouth daily. Leigh Venetia CROME, MD  Active   atorvastatin  (LIPITOR ) 80 MG tablet [id number]  Take 1 tablet (80 mg total) by mouth daily after supper. McDiarmid, Krystal BIRCH, MD  Active   bisacodyl  5 MG EC tablet [id number]  Take 2 tablets (10 mg total) by mouth every 24 hours as needed for constipation.   Active            Med Note (Shruthi Northrup D   Wed Nov 01, 2023  1:22 PM) Need a refill   blood glucose meter kit and supplies [id number]  Use up to 4 (four) times daily as directed Raulkar, Sven SQUIBB, MD  Active   Blood Glucose Monitoring Suppl (ACCU-CHEK GUIDE) w/Device KIT [id number]  Use up to 4 (four) times daily. Lorilee Sven SQUIBB, MD  Active   Discontinued 07/01/22 [phone]          Med Note>> Margrette Levorn POUR, [facility]   07/01/2022  9:52 AM changed to Toprol  XL    Cholecalciferol  (VITAMIN D ) 125 MCG (5000 UT) CAPS [id number]  Take one capsule by mouth once daily. McDiarmid, Krystal BIRCH, MD  Active   dabigatran  (PRADAXA ) 150 MG CAPS capsule [id number]   Take 1 capsule (150 mg total) by mouth 2 (two) times daily for anticoagulant. Christia Budds, MD  Active   empagliflozin  (JARDIANCE ) 25 MG TABS tablet [id number]  Take 1 tablet (25 mg total) by mouth daily. McDiarmid, Krystal BIRCH, MD  Active   escitalopram  (LEXAPRO ) 20 MG tablet [id number]  Take 1 tablet (20 mg total) by mouth at bedtime. Christia Budds, MD  Active   gabapentin  (NEURONTIN ) 300 MG capsule [id number]  Take 1 capsule (300 mg total) by mouth at bedtime. Christia Budds, MD  Active   glipiZIDE  (GLUCOTROL ) 5 MG tablet [id number]  Take 1 tablet (5 mg total) by mouth daily before breakfast. McDiarmid, Krystal BIRCH, MD  Active   glucose blood (ACCU-CHEK GUIDE) test strip [id number]  USE  4 TIMES DAILY Ganta, Anupa, DO  Active Father  hydrOXYzine  (ATARAX ) 10 MG tablet [id number]  Take 1 tablet (10 mg total) by mouth every 8 (eight) hours as needed for itching.  Active   hydrOXYzine  (ATARAX ) 10 MG tablet [id number]  Take 1 tablet (10 mg total) by mouth 3 (three) times daily as needed for anxiety. Christia Budds, MD  Active   levETIRAcetam  (KEPPRA ) 750 MG tablet [id number]  Take 1 tablet (750 mg total) by mouth 2 (two) times daily for seizures.   Active   lidocaine  (LIDODERM ) 5 % [id number]  Place 1 patch onto the skin daily. Remove & Discard patch within 12 hours or as directed by MD Raulkar, Sven SQUIBB, MD  Active   losartan  (COZAAR ) 50 MG tablet [id number]  Take 1 tablet (50 mg total) by mouth daily. Christia Budds, MD  Active   melatonin 5 MG TABS [id number]  Take 1 tablet (5 mg total) by mouth at bedtime. Christia Budds, MD  Active   metoprolol  tartrate (LOPRESSOR ) 25 MG tablet [id number]  Take 1 tablet (25 mg total) by mouth 2 (two) times daily. McDiarmid, Krystal BIRCH, MD  Active   pregabalin  (LYRICA ) 25 MG capsule [id number]  Take 1 capsule (25 mg total) by mouth 3 (three) times daily. Lorilee Sven SQUIBB, MD  Active   spironolactone  (ALDACTONE ) 25 MG tablet [id number]  Take 1 tablet (25 mg total) by mouth  daily. McDiarmid, Krystal BIRCH, MD  Active   tiZANidine  (ZANAFLEX ) 4 MG tablet [id number]  Take 1 tablet (4 mg total) by mouth every 6 (six) hours as needed for muscle spasms. Everhart, Kirstie, DO  Active   torsemide  (DEMADEX ) 20 MG tablet [id number]  Take 1 tablet (20 mg total) by mouth daily. Christia Budds, MD  Active Father  traZODone  (DESYREL ) 150 MG tablet [id number]  Take 1 tablet (150 mg total) by mouth at bedtime for insomnia. Everhart, Kirstie, DO  Active   Ubrogepant  (UBRELVY ) 100 MG TABS [id number]  Take 1 tablet (100 mg total) by mouth daily as needed. Leigh Venetia CROME, MD  Active             Recommendation:   Specialty provider follow-up 08/25 Continue Current Plan of Care  Follow Up Plan:   Telephone follow-up in 1 month  Rolin Kerns, LCSW [place]  [facility], [facility] Clinical Social Worker Direct Dial: [phone]  Fax: [phone] Website: [email or web address] 1:26 PM

## 2024-03-01 NOTE — Patient Instructions (Signed)
 Visit Information  Thank you for taking time to visit with me today. Please don't hesitate to contact me if I can be of assistance to you before our next scheduled appointment.  Your next care management appointment is by telephone on 09/12 at 1 PM  Please call the care guide team at [phone] if you need to cancel, schedule, or reschedule an appointment.   Please call the Suicide and Crisis Lifeline: 988 go to [facility] Urgent [facility] [address], [place] [phone]) call 911 if you are experiencing a Mental Health or Behavioral Health Crisis or need someone to talk to.  Rolin Kerns, LCSW [place]  [facility], [facility] Clinical Social Worker Direct Dial: [phone]  Fax: [phone] Website: [email or web address] 1:27 PM

## 2024-03-11 ENCOUNTER — Ambulatory Visit

## 2024-03-18 ENCOUNTER — Encounter: Admitting: Physical Medicine and Rehabilitation

## 2024-03-18 ENCOUNTER — Other Ambulatory Visit (HOSPITAL_COMMUNITY): Payer: Self-pay

## 2024-03-18 DIAGNOSIS — F5101 Primary insomnia: Secondary | ICD-10-CM

## 2024-03-18 DIAGNOSIS — F419 Anxiety disorder, unspecified: Secondary | ICD-10-CM

## 2024-03-18 DIAGNOSIS — M62838 Other muscle spasm: Secondary | ICD-10-CM

## 2024-03-18 DIAGNOSIS — I639 Cerebral infarction, unspecified: Secondary | ICD-10-CM

## 2024-03-18 MED ORDER — HYDROXYZINE HCL 10 MG PO TABS
10.0000 mg | ORAL_TABLET | Freq: Three times a day (TID) | ORAL | 0 refills | Status: DC | PRN
  Filled 2024-03-18: qty 90, 30d supply, fill #0

## 2024-03-18 MED ORDER — PREGABALIN 50 MG PO CAPS
50.0000 mg | ORAL_CAPSULE | Freq: Three times a day (TID) | ORAL | 3 refills | Status: DC
  Filled 2024-03-18: qty 90, 30d supply, fill #0

## 2024-03-18 NOTE — Progress Notes (Signed)
 Subjective:    Patient ID: [name], female    DOB: [birth year], 48 y.o.   MRN: [id number]  HPI An audio/video tele-health visit is felt to be the most appropriate encounter for this patient at this time. This is a follow up tele-visit via phone. The patient is at home. MD is at office. Prior to scheduling this appointment, our staff discussed the limitations of evaluation and management by telemedicine and the availability of in-person appointments. The patient expressed understanding and agreed to proceed.   [name] is a 48 year old woman who presents for follow-up of CVA  1) CVA -she would benefit from home health -she is receiving support from her ather -she is able to walk to and from the bathroom easily.  -AFO causing her pain, she feels she walks better without it -she has been receiving home therapy  2) HTN: -BP is 147/96 today  3) Anxiety:  -she needs refill of hydroxyzine   4) insomnia: -needs refill of her trazoodone  5) Peripheral neuropathy:  -pain is present in both feet -feels like burning -asks for tylenol  #3  6) Muscle spasms: -would like muscle relaxer  -she did not nee  7) Left sided neuropathic pain: -oxycodone  provided her some relief -failed gabapentin , Qutenza , has not tried Lyrica  -does not find Lyrica  helpful- she takes 25mg  Yesterday she walked around the whole [employer] and she was crying due to the aching pain   Pain Inventory Average Pain 10 Pain Right Now 10 My pain is constant, sharp, tingling, and aching   In the last 24 hours, has pain interfered with the following? General activity 7 Relation with others 7 Enjoyment of life 7 What TIME of day is your pain at its worst? morning , daytime, evening, and night Sleep (in general) Poor   Pain is worse with: walking Pain improves with: medication Relief from Meds: 0   Family History  Problem Relation Age of Onset   Diabetes Mother    Hypertension Mother    Hypertension  Father    Diabetes Father    Social History   Socioeconomic History   Marital status: Single    Spouse name: Not on file   Number of children: Not on file   Years of education: Not on file   Highest education level: Not on file  Occupational History   Not on file  Tobacco Use   Smoking status: Never   Smokeless tobacco: Never   Tobacco comments:    Never smoke 10/22/21  Vaping Use   Vaping status: Never Used  Substance and Sexual Activity   Alcohol use: No    Alcohol/week: 0.0 standard drinks of alcohol   Drug use: No   Sexual activity: Not Currently    Birth control/protection: None  Other Topics Concern   Not on file  Social History Narrative   Are you right handed or left handed? right   Are you currently employed ?    What is your current occupation?   Do you live at home alone?   Who lives with you?    What type of home do you live in: 1 story or 2 story?        Social Drivers of Corporate investment banker Strain: Low Risk  (04/24/2023)   Received from [facility]   Overall Financial Resource Strain (CARDIA)    Difficulty of Paying Living Expenses: Not hard at all  Food Insecurity: No Food Insecurity (12/27/2023)  Hunger Vital Sign    Worried About Running Out of Food in the Last Year: Never true    Ran Out of Food in the Last Year: Never true  Transportation Needs: No Transportation Needs (12/27/2023)   PRAPARE - Administrator, Civil Service (Medical): No    Lack of Transportation (Non-Medical): No  Physical Activity: Inactive (04/04/2023)   Exercise Vital Sign    Days of Exercise per Week: 0 days    Minutes of Exercise per Session: 0 min  Stress: Stress Concern Present (04/04/2023)   [employer] of Occupational Health - Occupational Stress Questionnaire    Feeling of Stress : Very much  Social Connections: Socially Isolated (04/04/2023)   Social Connection and Isolation Panel    Frequency of Communication with Friends and Family: Once  a week    Frequency of Social Gatherings with Friends and Family: Never    Attends Religious Services: Never    Database administrator or Organizations: No    Attends Banker Meetings: Never    Marital Status: Never married   Past Surgical History:  Procedure Laterality Date   BIOPSY  11/05/2021   Procedure: BIOPSY;  Surgeon: Stacia Glendia BRAVO, MD;  Location: [facility] ENDOSCOPY;  Service: Gastroenterology;;   CERVIX LESION DESTRUCTION     CESAREAN SECTION     ESOPHAGOGASTRODUODENOSCOPY (EGD) WITH PROPOFOL  N/A 11/05/2021   Procedure: ESOPHAGOGASTRODUODENOSCOPY (EGD) WITH PROPOFOL ;  Surgeon: Stacia Glendia BRAVO, MD;  Location: [facility] ENDOSCOPY;  Service: Gastroenterology;  Laterality: N/A;   IR CT HEAD LTD  09/24/2022   IR PERCUTANEOUS ART THROMBECTOMY/INFUSION INTRACRANIAL INC DIAG ANGIO  09/24/2022   RADIOLOGY WITH ANESTHESIA N/A 09/24/2022   Procedure: IR WITH ANESTHESIA;  Surgeon: Radiologist, Medication, MD;  Location: [facility] OR;  Service: Radiology;  Laterality: N/A;   RIGHT/LEFT HEART CATH AND CORONARY ANGIOGRAPHY N/A 03/29/2019   Procedure: RIGHT/LEFT HEART CATH AND CORONARY ANGIOGRAPHY;  Surgeon: Rolan Ezra RAMAN, MD;  Location: [facility] INVASIVE CV LAB;  Service: Cardiovascular;  Laterality: N/A;   RIGHT/LEFT HEART CATH AND CORONARY ANGIOGRAPHY N/A 09/20/2021   Procedure: RIGHT/LEFT HEART CATH AND CORONARY ANGIOGRAPHY;  Surgeon: Rolan Ezra RAMAN, MD;  Location: [facility] INVASIVE CV LAB;  Service: Cardiovascular;  Laterality: N/A;   Past Medical History:  Diagnosis Date   Abnormal Pap smear    Acute combined systolic and diastolic heart failure (HCC) 02/19/2019   Wt Readings from Last 3 Encounters:  09/18/21  84.7 kg  09/02/21  84.4 kg  08/31/21  84.2 kg         Acute heart failure (HCC) 01/29/2022   Acute on chronic combined systolic and diastolic CHF (congestive heart failure) (HCC) 02/19/2019   Acute on chronic congestive heart failure (HCC)    Acute on chronic congestive heart failure (HCC)     Acute on chronic diastolic CHF (congestive heart failure) (HCC) 10/19/2021   Acute on chronic systolic heart failure (HCC) 02/22/2022   Anemia    Asthma    Atrial fibrillation (HCC)    history of paroxysmal atrial fibrillation   CHF (congestive heart failure) (HCC) 11/02/2021   CHF exacerbation (HCC) 10/17/2021   Diabetes mellitus    DKA, type 2 (HCC) 02/06/2019   Elevated troponin 09/30/2019   Heart failure with reduced ejection fraction (HCC)    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  Consider chronic etiology given continued fertility issues   Hypertension  Hypertensive emergency 09/30/2019   Hypertensive urgency 06/02/2022   Major depressive disorder 02/08/2012   Reports that her infertility is a major cause of her depression Reports Prozac  has worked in the past for her.     Migraines    Morbid obesity (HCC) 02/06/2019   Nausea & vomiting 06/26/2022   Nonischemic cardiomyopathy (HCC) 09/22/2021   NSTEMI (non-ST elevated myocardial infarction) (HCC) 02/23/2022   Obstructive sleep apnea 06/30/2015   Polycystic ovarian disease    There were no vitals taken for this visit.  Opioid Risk Score:   Fall Risk Score:  `1  Depression screen [facility] 2/9     02/27/2024    1:34 PM 02/16/2024   11:11 AM 02/09/2024   11:43 AM 12/27/2023    3:55 PM 11/01/2023    1:11 PM 07/27/2023    2:59 PM 07/04/2023   10:56 AM  Depression screen PHQ 2/9  Decreased Interest 0 1 1 3 2 3 1   Down, Depressed, Hopeless 0 1 1 2  0 3 1  PHQ - 2 Score 0 2 2 5 2 6 2   Altered sleeping   2 0 3 3   Tired, decreased energy   1 1 2 3    Change in appetite   0 0 0 0   Feeling bad or failure about yourself    0 1 1 2    Trouble concentrating   0 0 0 2   Moving slowly or fidgety/restless   0 0 0 2   Suicidal thoughts   0 -- 0 0   PHQ-9 Score   5 7 8 18    Difficult doing work/chores   Very difficult   Very difficult      Review of Systems   Gastrointestinal:  Positive for constipation.  Musculoskeletal:  Positive for gait problem.       Lt arm pain, pain in both feet, left leg   All other systems reviewed and are negative.      Objective:   Physical Exam PRIOR EXAM: Gen: no distress, normal appearing HEENT: oral mucosa pink and moist, NCAT Cardio: Reg rate Chest: normal effort, normal rate of breathing Abd: soft, non-distended Ext: no edema Psych: pleasant, normal affect Skin: intact Neuro: Alert and oriented x3, left sided weakness       Assessment & Plan:  1) CVA -prescribed home therapy -provided information regarding [facility] program Prescribing Home Zynex NexWave Stimulator Device and supplies as needed. IFC, NMES and TENS medically necessary Treatment Rx: Daily @ 30-40 minutes per treatment PRN. Zynex NexWave only, no substitutions. Treatment Goals: 1) To reduce and/or eliminate pain 2) To improve functional capacity and Activities of daily living 3) To reduce or prevent the need for oral medications 4) To improve circulation in the injured region 5) To decrease or prevent muscle spasm and muscle atrophy 6) To provide a self-management tool to the patient The patient has not sufficiently improved with conservative care. Numerous studies indexed by Medline and PubMed.gov have shown Neuromuscular, Interferential, and TENS stimulators to reduce pain, improve function, and reduce medication use in injured patients. Continued use of this evidence based, safe, drug free treatment is both reasonable and medically necessary at this time.   2) Anxiety: -prescribed hydroxyzine   3) Insomnia: -trazodone  prescribed  4) HTN: -continue home blood pressure medications -Advised checking BP daily at home and logging results to bring into follow-up appointment with PCP and myself. -Reviewed BP meds today.  -Advised regarding healthy foods that can help lower  blood pressure and provided with a list: 1) citrus foods-  high in vitamins and minerals 2) salmon and other fatty fish - reduces inflammation and oxylipins 3) swiss chard (leafy green)- high level of nitrates 4) pumpkin seeds- one of the best natural sources of magnesium  5) Beans and lentils- high in fiber, magnesium , and potassium 6) Berries- high in flavonoids 7) Amaranth (whole grain, can be cooked similarly to rice and oats)- high in magnesium  and fiber 8) Pistachios- even more effective at reducing BP than other nuts 9) Carrots- high in phenolic compounds that relax blood vessels and reduce inflammation 10) Celery- contain phthalides that relax tissues of arterial walls 11) Tomatoes- can also improve cholesterol and reduce risk of heart disease 12) Broccoli- good source of magnesium , calcium , and potassium 13) Greek yogurt: high in potassium and calcium  14) Herbs and spices: Celery seed, cilantro, saffron, lemongrass, black cumin, ginseng, cinnamon, cardamom, sweet basil, and ginger 15) Chia and flax seeds- also help to lower cholesterol and blood sugar 16) Beets- high levels of nitrates that relax blood vessels  17) spinach and bananas- high in potassium  -Provided lise of supplements that can help with hypertension:  1) magnesium : one high quality brand is Bioptemizers since it contains all 7 types of magnesium , otherwise over the counter magnesium  gluconate 400mg  is a good option 2) B vitamins 3) vitamin D  4) potassium 5) CoQ10 6) L-arginine 7) Vitamin C 8) Beetroot -Educated that goal BP is 120/80. -Made goal to incorporate some of the above foods into diet.    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 Discussed that this is a capsaicin  patch, stronger than capsaicin  cream. Discussed that it is currently approved for diabetic peripheral neuropathy and post-herpetic neuralgia, but that it has also shown benefit in treating other forms of  neuropathy. Provided patient with link to site to learn more about the patch: https://www.qutenza .com/. Discussed that the patch would be placed in office and benefits usually last 3 months. Discussed that unintended exposure to capsaicin  can cause severe irritation of eyes, mucous membranes, respiratory tract, and skin, but that Qutenza  is a local treatment and does not have the systemic side effects of other nerve medications. Discussed that there may be pain, itching, erythema, and decreased sensory function associated with the application of Qutenza . Side effects usually subside within 1 week. A cold pack of analgesic medications can help with these side effects. Blood pressure can also be increased due to pain associated with administration of the patch.   Prescribing Home Zynex NexWave Stimulator Device and supplies as needed. IFC, NMES and TENS medically necessary Treatment Rx: Daily @ 30-40 minutes per treatment PRN. Zynex NexWave only, no substitutions. Treatment Goals: 1) To reduce and/or eliminate pain 2) To improve functional capacity and Activities of daily living 3) To reduce or prevent the need for oral medications 4) To improve circulation in the injured region 5) To decrease or prevent muscle spasm and muscle atrophy 6) To provide a self-management tool to the patient The patient has not sufficiently improved with conservative care. Numerous studies indexed by Medline and PubMed.gov have shown Neuromuscular, Interferential, and TENS stimulators to reduce pain, improve function, and reduce medication use in injured patients. Continued use of this evidence based, safe, drug free treatment is both reasonable and medically necessary at this time.   Pain contract and urine sample performed today  -glucose meter and supplies ordered  6) Muscle  spasms:  -flexeril  ordered prn  7) chronic left shoulder pain: -oxycodone  switched to tylenol  #3 as per patient's request -f/u with Fidela on the 3rd  and can switch back if tylenol  #3 is not effective -lidocaine  patch prescribed

## 2024-03-19 ENCOUNTER — Other Ambulatory Visit (HOSPITAL_COMMUNITY): Payer: Self-pay

## 2024-03-19 ENCOUNTER — Other Ambulatory Visit: Payer: Self-pay | Admitting: Family Medicine

## 2024-03-19 ENCOUNTER — Other Ambulatory Visit: Payer: Self-pay

## 2024-03-19 DIAGNOSIS — F5101 Primary insomnia: Secondary | ICD-10-CM

## 2024-03-19 MED ORDER — TRAZODONE HCL 150 MG PO TABS
150.0000 mg | ORAL_TABLET | Freq: Every evening | ORAL | 3 refills | Status: DC
  Filled 2024-03-19: qty 30, 30d supply, fill #0
  Filled 2024-04-15: qty 30, 30d supply, fill #1
  Filled 2024-05-20: qty 30, 30d supply, fill #2
  Filled 2024-06-17: qty 30, 30d supply, fill #3

## 2024-03-20 ENCOUNTER — Other Ambulatory Visit (HOSPITAL_COMMUNITY): Payer: Self-pay

## 2024-03-27 ENCOUNTER — Telehealth (INDEPENDENT_AMBULATORY_CARE_PROVIDER_SITE_OTHER): Admitting: Family Medicine

## 2024-03-27 DIAGNOSIS — I63411 Cerebral infarction due to embolism of right middle cerebral artery: Secondary | ICD-10-CM

## 2024-03-27 DIAGNOSIS — G8194 Hemiplegia, unspecified affecting left nondominant side: Secondary | ICD-10-CM

## 2024-03-27 NOTE — Progress Notes (Signed)
 [facility] Telemedicine Visit  Patient consented to have virtual visit and was identified by name and date of birth. Method of visit: Video was attempted but was interrupted: <50% of visit completed via video (audio issues), remainder of visit conducted via phone  Encounter participants: Patient: [name] - located at home Provider: Rea Raring - located at [facility]  Chief Complaint: Wants power wheelchair  HPI:  Has been using a manual wheelchair since stroke 1 year ago Would like a power wheelchair, made appointment to discuss this  ROS: per HPI  Pertinent PMHx: R MCA CVA with left hemiplegia, CHF, HTN, DM2  Exam:  There were no vitals taken for this visit.  Respiratory: Normal work of breathing on room air  Assessment/Plan: 1. Cerebrovascular accident (CVA) due to embolism of right middle cerebral artery (HCC) (Primary) 2. Left hemiplegia (HCC) - Ambulatory Referral to Neuro Rehab for power wheelchair evaluation  Also advised scheduling PCP appointment for health maintenance including A1c, colonoscopy, vaccine discussion.  Patient prefers to schedule appointment at another time.    Time spent during visit with patient: 10 minutes

## 2024-04-05 ENCOUNTER — Other Ambulatory Visit: Payer: Self-pay | Admitting: Licensed Clinical Social Worker

## 2024-04-05 NOTE — Patient Instructions (Signed)
 Visit Information  Thank you for taking time to visit with me today. Please don't hesitate to contact me if I can be of assistance to you before our next scheduled appointment.  Your next care management appointment is by telephone on 10/13 at 2 PM   Please call the care guide team at [phone] if you need to cancel, schedule, or reschedule an appointment.   Please call the Suicide and Crisis Lifeline: 988 go to [facility] [address], [place] [id number]) call 911 if you are experiencing a Mental Health or Behavioral Health Crisis or need someone to talk to.  Rolin Kerns, LCSW [place]  [facility], [facility] Clinical Social Worker Direct Dial: [phone]  Fax: [phone] Website: [email or web address] 3:25 PM

## 2024-04-05 NOTE — Patient Outreach (Signed)
 Complex Care Management   Visit Note  04/05/2024  Name:  [name] MRN: [id number] DOB: November 21, 1975  Situation: Referral received for Complex Care Management related to Mental/Behavioral Health diagnosis MDD and Chronic Pain I obtained verbal consent from Patient.  Visit completed with Patient  on the phone  Background:   Past Medical History:  Diagnosis Date   Abnormal Pap smear    Acute combined systolic and diastolic heart failure (HCC) 02/19/2019   Wt Readings from Last 3 Encounters:  09/18/21  84.7 kg  09/02/21  84.4 kg  08/31/21  84.2 kg         Acute heart failure (HCC) 01/29/2022   Acute on chronic combined systolic and diastolic CHF (congestive heart failure) (HCC) 02/19/2019   Acute on chronic congestive heart failure (HCC)    Acute on chronic congestive heart failure (HCC)    Acute on chronic diastolic CHF (congestive heart failure) (HCC) 10/19/2021   Acute on chronic systolic heart failure (HCC) 02/22/2022   Anemia    Asthma    Atrial fibrillation (HCC)    history of paroxysmal atrial fibrillation   CHF (congestive heart failure) (HCC) 11/02/2021   CHF exacerbation (HCC) 10/17/2021   Diabetes mellitus    DKA, type 2 (HCC) 02/06/2019   Elevated troponin 09/30/2019   Heart failure with reduced ejection fraction (HCC)    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  Consider chronic etiology given continued fertility issues   Hypertension    Hypertensive emergency 09/30/2019   Hypertensive urgency 06/02/2022   Major depressive disorder 02/08/2012   Reports that her infertility is a major cause of her depression Reports Prozac  has worked in the past for her.     Migraines    Morbid obesity (HCC) 02/06/2019   Nausea & vomiting 06/26/2022   Nonischemic cardiomyopathy (HCC) 09/22/2021   NSTEMI (non-ST elevated myocardial infarction) (HCC) 02/23/2022   Obstructive sleep apnea 06/30/2015   Polycystic  ovarian disease     Assessment: Patient Reported Symptoms:  Cognitive Cognitive Status: No symptoms reported, Alert and oriented to person, place, and time, Normal speech and language skills Cognitive/Intellectual Conditions Management [RPT]: None reported or documented in medical history or problem list   Health Maintenance Behaviors: Annual physical exam  Neurological Neurological Review of Symptoms: Not assessed    HEENT HEENT Symptoms Reported: Not assessed      Cardiovascular Cardiovascular Symptoms Reported: Not assessed    Respiratory Respiratory Symptoms Reported: Not assesed    Endocrine Endocrine Symptoms Reported: Not assessed    Gastrointestinal Gastrointestinal Symptoms Reported: Not assessed      Genitourinary Genitourinary Symptoms Reported: Not assessed    Integumentary Integumentary Symptoms Reported: Not assessed    Musculoskeletal Musculoskelatal Symptoms Reviewed: Unsteady gait, Muscle pain, Difficulty walking Musculoskeletal Management Strategies: Medication therapy, Routine screening, Coping strategies      Psychosocial Psychosocial Symptoms Reported: Other Other Psychosocial Conditions: Stress/Chronic Pain Behavioral Management Strategies: Coping strategies, Medication therapy, Adequate rest, Support system Major Change/Loss/Stressor/Fears (CP): Medical condition, self      04/05/2024    PHQ2-9 Depression Screening   Little interest or pleasure in doing things    Feeling down, depressed, or hopeless    PHQ-2 - Total Score    Trouble falling or staying asleep, or sleeping too much    Feeling tired or having little energy    Poor appetite or overeating     Feeling bad about yourself -  or that you are a failure or have let yourself or your family down    Trouble concentrating on things, such as reading the newspaper or watching television    Moving or speaking so slowly that other people could have noticed.  Or the opposite - being so fidgety or  restless that you have been moving around a lot more than usual    Thoughts that you would be better off dead, or hurting yourself in some way    PHQ2-9 Total Score    If you checked off any problems, how difficult have these problems made it for you to do your work, take care of things at home, or get along with other people    Depression Interventions/Treatment      There were no vitals filed for this visit.  Medications Reviewed Today     Reviewed by Tammy Rolin BIRCH, LCSW (Social Worker) on 04/05/24 at 1512  Med List Status: <None>   Medication Order Taking? Sig Documenting Provider Last Dose Status Informant  Accu-Chek Softclix Lancets lancets [id number]  USE 1  TO CHECK GLUCOSE 4 TIMES DAILY Chambliss, Tammy CROME, MD  Active Father           Med Note ANGELICA, [name]   Wed Nov 01, 2023  1:20 PM) Have to pay out of pocket fees   Accu-Chek Softclix Lancets lancets [id number]  Use each in the morning, at noon, and at bedtime. Tammy Tammy SQUIBB, MD  Active   acetaminophen  (TYLENOL ) 500 MG tablet [id number]  Take 1-2 tablets (500-1,000 mg total) by mouth every 6 (six) hours as needed for mild pain (pain score 1-3) or headache (OR CRAMPING). Tammy Venetia CROME, MD  Active   acetaminophen -codeine  (TYLENOL  #3) 300-30 MG tablet [id number]  Take 1 tablet by mouth 2 (two) times daily as needed for moderate pain (pain score 4-6). Tammy Tammy SQUIBB, MD  Active   albuterol  (VENTOLIN  HFA) 108 (90 Base) MCG/ACT inhaler [id number]  Inhale 2 puffs into the lungs every 6 (six) hours as needed for wheezing or shortness of breath. Tammy Budds, MD  Active   Atogepant  (QULIPTA ) 30 MG TABS [id number]  Take 1 tablet (30 mg total) by mouth daily. Tammy Venetia CROME, MD  Active   atorvastatin  (LIPITOR ) 80 MG tablet [id number]  Take 1 tablet (80 mg total) by mouth daily after supper. [name], Tammy BIRCH, MD  Active   bisacodyl  5 MG EC tablet [id number]  Take 2 tablets (10 mg total) by mouth every 24 hours as needed for  constipation.   Active            Med Note (Abenezer Odonell [name]   Wed Nov 01, 2023  1:22 PM) Need a refill   blood glucose meter kit and supplies [id number]  Use up to 4 (four) times daily as directed [name], Tammy SQUIBB, MD  Active   Blood Glucose Monitoring Suppl (ACCU-CHEK GUIDE) w/Device KIT [id number]  Use up to 4 (four) times daily. Tammy Tammy SQUIBB, MD  Active   Discontinued 07/01/22 [phone]          Med Note>> [name], [facility]   07/01/2022  9:52 AM changed to Toprol  XL    Cholecalciferol  (VITAMIN [name] ) 125 MCG (5000 UT) CAPS [id number]  Take one capsule by mouth once daily. [name], Tammy BIRCH, MD  Active   dabigatran  (PRADAXA ) 150 MG CAPS capsule [id number]  Take 1 capsule (150 mg total) by mouth 2 (two) times daily for anticoagulant. [name], Mayuri,  MD  Active   empagliflozin  (JARDIANCE ) 25 MG TABS tablet [id number]  Take 1 tablet (25 mg total) by mouth daily. [name], Tammy BIRCH, MD  Active   escitalopram  (LEXAPRO ) 20 MG tablet [id number]  Take 1 tablet (20 mg total) by mouth at bedtime. Tammy Budds, MD  Active   gabapentin  (NEURONTIN ) 300 MG capsule [id number]  Take 1 capsule (300 mg total) by mouth at bedtime. Tammy Budds, MD  Active   glipiZIDE  (GLUCOTROL ) 5 MG tablet [id number]  Take 1 tablet (5 mg total) by mouth daily before breakfast. [name], Tammy BIRCH, MD  Active   glucose blood (ACCU-CHEK GUIDE) test strip [id number]  USE  4 TIMES DAILY Ganta, Anupa, DO  Active Father  hydrOXYzine  (ATARAX ) 10 MG tablet [id number]  Take 1 tablet (10 mg total) by mouth 3 (three) times daily as needed for anxiety. Tammy Tammy SQUIBB, MD  Active   levETIRAcetam  (KEPPRA ) 750 MG tablet [id number]  Take 1 tablet (750 mg total) by mouth 2 (two) times daily for seizures.   Active   lidocaine  (LIDODERM ) 5 % [id number]  Place 1 patch onto the skin daily. Remove & Discard patch within 12 hours or as directed by MD [name], Tammy SQUIBB, MD  Active   losartan  (COZAAR ) 50 MG tablet [id number]  Take 1 tablet (50 mg  total) by mouth daily. Tammy Budds, MD  Active   melatonin 5 MG TABS [id number]  Take 1 tablet (5 mg total) by mouth at bedtime. Tammy Budds, MD  Active   metoprolol  tartrate (LOPRESSOR ) 25 MG tablet [id number]  Take 1 tablet (25 mg total) by mouth 2 (two) times daily. [name], Tammy BIRCH, MD  Active   pregabalin  (LYRICA ) 50 MG capsule [id number]  Take 1 capsule (50 mg total) by mouth 3 (three) times daily. Tammy Tammy SQUIBB, MD  Active   spironolactone  (ALDACTONE ) 25 MG tablet [id number]  Take 1 tablet (25 mg total) by mouth daily. [name], Tammy BIRCH, MD  Active   tiZANidine  (ZANAFLEX ) 4 MG tablet [id number]  Take 1 tablet (4 mg total) by mouth every 6 (six) hours as needed for muscle spasms. [name], Kirstie, DO  Active   torsemide  (DEMADEX ) 20 MG tablet [id number]  Take 1 tablet (20 mg total) by mouth daily. Tammy Budds, MD  Active Father  traZODone  (DESYREL ) 150 MG tablet [id number]  Take 1 tablet (150 mg total) by mouth at bedtime for insomnia. [name], Kirstie, DO  Active   Ubrogepant  (UBRELVY ) 100 MG TABS [id number]  Take 1 tablet (100 mg total) by mouth daily as needed. Tammy Venetia CROME, MD  Active             Recommendation:   Continue Current Plan of Care  Follow Up Plan:   Telephone follow-up in 1 month  Rolin Tammy HUGHS [facility]  [facility], [facility] Clinical Social Worker Direct Dial: [phone]  Fax: [phone] Website: [email or web address] 3:24 PM

## 2024-04-09 NOTE — Progress Notes (Unsigned)
 Subjective:    Patient ID: [name], female    DOB: 12/01/1975, 48 y.o.   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  Stroke onset greater than 1 year ago.  She sees Dr. Elihu her general physiatrist.  She is here for 6-week follow-up after botulinum toxin injection for chronic spasticity She had no postinjection complications.  We discussed the goals of treatment being relaxation of hypertonic muscles.  We also discussed that botulinum toxin does not increase strength or dexterity in the limbs affected by stroke. Pain Inventory Average Pain 5 Pain Right Now 5 My pain is aching  In the last 24 hours, has pain interfered with the following? General activity 6 Relation with others 6 Enjoyment of life 9 What TIME of day is your pain at its worst? morning , daytime, evening, and night Sleep (in general) Poor  Pain is worse with: walking, bending, standing, and some activites Pain improves with: nothing Relief from Meds: 0  Family History  Problem Relation Age of Onset   Diabetes Mother    Hypertension Mother    Hypertension Father    Diabetes Father    Social History   Socioeconomic History   Marital status: Single    Spouse name: Not on file   Number of children: Not on file   Years of education: Not on file   Highest education level: Not on file  Occupational History   Not on file  Tobacco Use   Smoking status: Never   Smokeless tobacco: Never   Tobacco comments:    Never smoke 10/22/21  Vaping Use   Vaping status: Never Used  Substance and Sexual Activity   Alcohol use: No    Alcohol/week: 0.0 standard drinks of alcohol   Drug use: No   Sexual activity: Not Currently    Birth control/protection: None  Other Topics Concern   Not on file  Social History Narrative   Are you right handed or left handed? right   Are you currently employed ?    What is your current occupation?   Do  you live at home alone?   Who lives with you?    What type of home do you live in: 1 story or 2 story?        Social Drivers of Corporate investment banker Strain: Low Risk  (04/24/2023)   Received from [facility]   Overall Financial Resource Strain (CARDIA)    Difficulty of Paying Living Expenses: Not hard at all  Food Insecurity: No Food Insecurity (12/27/2023)   Hunger Vital Sign    Worried About Running Out of Food in the Last Year: Never true    Ran Out of Food in the Last Year: Never true  Transportation Needs: No Transportation Needs (12/27/2023)   PRAPARE - Administrator, Civil Service (Medical): No    Lack of Transportation (Non-Medical): No  Physical Activity: Inactive (04/04/2023)   Exercise Vital Sign    Days of Exercise per Week: 0 days    Minutes of Exercise per Session: 0 min  Stress: Stress Concern Present (04/04/2023)   [employer] of Occupational Health - Occupational Stress Questionnaire    Feeling of Stress : Very much  Social Connections: Socially Isolated (04/04/2023)   Social Connection and Isolation Panel    Frequency of Communication with Friends and Family: Once a week    Frequency of Social Gatherings with  Friends and Family: Never    Attends Religious Services: Never    Database administrator or Organizations: No    Attends Banker Meetings: Never    Marital Status: Never married   Past Surgical History:  Procedure Laterality Date   BIOPSY  11/05/2021   Procedure: BIOPSY;  Surgeon: Stacia Glendia BRAVO, MD;  Location: [facility] ENDOSCOPY;  Service: Gastroenterology;;   CERVIX LESION DESTRUCTION     CESAREAN SECTION     ESOPHAGOGASTRODUODENOSCOPY (EGD) WITH PROPOFOL  N/A 11/05/2021   Procedure: ESOPHAGOGASTRODUODENOSCOPY (EGD) WITH PROPOFOL ;  Surgeon: Stacia Glendia BRAVO, MD;  Location: [facility] ENDOSCOPY;  Service: Gastroenterology;  Laterality: N/A;   IR CT HEAD LTD  09/24/2022   IR PERCUTANEOUS ART THROMBECTOMY/INFUSION INTRACRANIAL  INC DIAG ANGIO  09/24/2022   RADIOLOGY WITH ANESTHESIA N/A 09/24/2022   Procedure: IR WITH ANESTHESIA;  Surgeon: Radiologist, Medication, MD;  Location: [facility] OR;  Service: Radiology;  Laterality: N/A;   RIGHT/LEFT HEART CATH AND CORONARY ANGIOGRAPHY N/A 03/29/2019   Procedure: RIGHT/LEFT HEART CATH AND CORONARY ANGIOGRAPHY;  Surgeon: Rolan Ezra RAMAN, MD;  Location: [facility] INVASIVE CV LAB;  Service: Cardiovascular;  Laterality: N/A;   RIGHT/LEFT HEART CATH AND CORONARY ANGIOGRAPHY N/A 09/20/2021   Procedure: RIGHT/LEFT HEART CATH AND CORONARY ANGIOGRAPHY;  Surgeon: Rolan Ezra RAMAN, MD;  Location: [facility] INVASIVE CV LAB;  Service: Cardiovascular;  Laterality: N/A;   Past Surgical History:  Procedure Laterality Date   BIOPSY  11/05/2021   Procedure: BIOPSY;  Surgeon: Stacia Glendia BRAVO, MD;  Location: [facility] ENDOSCOPY;  Service: Gastroenterology;;   CERVIX LESION DESTRUCTION     CESAREAN SECTION     ESOPHAGOGASTRODUODENOSCOPY (EGD) WITH PROPOFOL  N/A 11/05/2021   Procedure: ESOPHAGOGASTRODUODENOSCOPY (EGD) WITH PROPOFOL ;  Surgeon: Stacia Glendia BRAVO, MD;  Location: [facility] ENDOSCOPY;  Service: Gastroenterology;  Laterality: N/A;   IR CT HEAD LTD  09/24/2022   IR PERCUTANEOUS ART THROMBECTOMY/INFUSION INTRACRANIAL INC DIAG ANGIO  09/24/2022   RADIOLOGY WITH ANESTHESIA N/A 09/24/2022   Procedure: IR WITH ANESTHESIA;  Surgeon: Radiologist, Medication, MD;  Location: [facility] OR;  Service: Radiology;  Laterality: N/A;   RIGHT/LEFT HEART CATH AND CORONARY ANGIOGRAPHY N/A 03/29/2019   Procedure: RIGHT/LEFT HEART CATH AND CORONARY ANGIOGRAPHY;  Surgeon: Rolan Ezra RAMAN, MD;  Location: [facility] INVASIVE CV LAB;  Service: Cardiovascular;  Laterality: N/A;   RIGHT/LEFT HEART CATH AND CORONARY ANGIOGRAPHY N/A 09/20/2021   Procedure: RIGHT/LEFT HEART CATH AND CORONARY ANGIOGRAPHY;  Surgeon: Rolan Ezra RAMAN, MD;  Location: [facility] INVASIVE CV LAB;  Service: Cardiovascular;  Laterality: N/A;   Past Medical History:  Diagnosis Date   Abnormal Pap smear     Acute combined systolic and diastolic heart failure (HCC) 02/19/2019   Wt Readings from Last 3 Encounters:  09/18/21  84.7 kg  09/02/21  84.4 kg  08/31/21  84.2 kg         Acute heart failure (HCC) 01/29/2022   Acute on chronic combined systolic and diastolic CHF (congestive heart failure) (HCC) 02/19/2019   Acute on chronic congestive heart failure (HCC)    Acute on chronic congestive heart failure (HCC)    Acute on chronic diastolic CHF (congestive heart failure) (HCC) 10/19/2021   Acute on chronic systolic heart failure (HCC) 02/22/2022   Anemia    Asthma    Atrial fibrillation (HCC)    history of paroxysmal atrial fibrillation   CHF (congestive heart failure) (HCC) 11/02/2021   CHF exacerbation (HCC) 10/17/2021   Diabetes mellitus    DKA, type 2 (HCC) 02/06/2019  Elevated troponin 09/30/2019   Heart failure with reduced ejection fraction (HCC)    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  Consider chronic etiology given continued fertility issues   Hypertension    Hypertensive emergency 09/30/2019   Hypertensive urgency 06/02/2022   Major depressive disorder 02/08/2012   Reports that her infertility is a major cause of her depression Reports Prozac  has worked in the past for her.     Migraines    Morbid obesity (HCC) 02/06/2019   Nausea & vomiting 06/26/2022   Nonischemic cardiomyopathy (HCC) 09/22/2021   NSTEMI (non-ST elevated myocardial infarction) (HCC) 02/23/2022   Obstructive sleep apnea 06/30/2015   Polycystic ovarian disease    BP (!) 171/99   Pulse 71   Ht 4' 11 (1.499 m)   Wt 136 lb (61.7 kg)   SpO2 98%   BMI 27.47 kg/m   Opioid Risk Score:   Fall Risk Score:  `1  Depression screen [facility] 2/9     04/12/2024   11:16 AM 02/27/2024    1:34 PM 02/16/2024   11:11 AM 02/09/2024   11:43 AM 12/27/2023    3:55 PM 11/01/2023    1:11 PM 07/27/2023    2:59 PM  Depression screen PHQ 2/9  Decreased Interest  1 0 1 1 3 2 3   Down, Depressed, Hopeless 1 0 1 1 2  0 3  PHQ - 2 Score 2 0 2 2 5 2 6   Altered sleeping    2 0 3 3  Tired, decreased energy    1 1 2 3   Change in appetite    0 0 0 0  Feeling bad or failure about yourself     0 1 1 2   Trouble concentrating    0 0 0 2  Moving slowly or fidgety/restless    0 0 0 2  Suicidal thoughts    0 -- 0 0  PHQ-9 Score    5 7 8 18   Difficult doing work/chores    Very difficult   Very difficult     Review of Systems  Musculoskeletal:  Positive for gait problem.       Left shoulder and arm and leg  All other systems reviewed and are negative.      Objective:   Physical Exam General No acute distress Mood affect appropriate Extremities without edema Motor strength 3 - at the left deltoid and elbow flexor and finger flexors with increased tone at the elbow and finger flexors.   Tone  Left FPL MAS 1  Left dig 4 and 5 MAS 3 with pain Left dig 2 and 3 MAS 1 no pain Left elbow flexors MAS 3 mainly brachiorad  Left foot 2 beats of clonus at the ankle no clonus at the foot inverters.  MSK no evidence of contracture at the left elbow wrist or fingers     Assessment & Plan:  1.  Left spastic hemiplegia, chronic despite inpatient and outpatient PT OT.  We discussed goals of care. We discussed making some alterations of the botulinum toxin dosing.  Since she is already at the maximum dosing, this involved reducing dose at the biceps and brachial radialis so that the brachial radialis can be treated. Botox  400 units total Left upper limb Biceps 50 units Brachialis 50units BR 50 nits FCR 50 units- use Estim to isolate dig 4 and 5 FDS 50 units use Estim to isolate dig 4  and 5 FDP 50 units FPL 25 units   Left lower limb Posterior tibialis 75 units  2.  Chronic neurogenic pain left upper limb.  Pregabalin  prescribed by Dr. Lorilee  I sent her message regarding the patient's concerns.

## 2024-04-12 ENCOUNTER — Encounter: Payer: Self-pay | Admitting: Physical Medicine & Rehabilitation

## 2024-04-12 ENCOUNTER — Encounter: Attending: Physical Medicine and Rehabilitation | Admitting: Physical Medicine & Rehabilitation

## 2024-04-12 VITALS — BP 171/99 | HR 71 | Ht 59.0 in | Wt 136.0 lb

## 2024-04-12 DIAGNOSIS — M62838 Other muscle spasm: Secondary | ICD-10-CM | POA: Insufficient documentation

## 2024-04-12 DIAGNOSIS — G6289 Other specified polyneuropathies: Secondary | ICD-10-CM | POA: Diagnosis present

## 2024-04-12 DIAGNOSIS — I69954 Hemiplegia and hemiparesis following unspecified cerebrovascular disease affecting left non-dominant side: Secondary | ICD-10-CM | POA: Diagnosis present

## 2024-04-12 NOTE — Patient Instructions (Signed)
 Will repeat Botox  in 6wks and make some adjustments

## 2024-04-15 ENCOUNTER — Other Ambulatory Visit: Payer: Self-pay | Admitting: Student

## 2024-04-15 ENCOUNTER — Other Ambulatory Visit (HOSPITAL_COMMUNITY): Payer: Self-pay

## 2024-04-15 ENCOUNTER — Encounter: Admitting: Physical Medicine and Rehabilitation

## 2024-04-15 DIAGNOSIS — M62838 Other muscle spasm: Secondary | ICD-10-CM

## 2024-04-15 DIAGNOSIS — G6289 Other specified polyneuropathies: Secondary | ICD-10-CM

## 2024-04-15 DIAGNOSIS — M79605 Pain in left leg: Secondary | ICD-10-CM

## 2024-04-15 MED ORDER — OXYCODONE HCL 5 MG PO TABS
5.0000 mg | ORAL_TABLET | Freq: Two times a day (BID) | ORAL | 0 refills | Status: DC | PRN
  Filled 2024-04-15: qty 60, 30d supply, fill #0

## 2024-04-15 MED ORDER — PREGABALIN 25 MG PO CAPS
25.0000 mg | ORAL_CAPSULE | Freq: Three times a day (TID) | ORAL | 3 refills | Status: DC
  Filled 2024-04-15: qty 90, 30d supply, fill #0

## 2024-04-15 NOTE — Addendum Note (Signed)
 Addended by: LORILEE SVEN SQUIBB on: 04/15/2024 02:36 PM   Modules accepted: Orders

## 2024-04-15 NOTE — Progress Notes (Addendum)
 Subjective:    Patient ID: [name], female    DOB: [birth year], 48 y.o.   MRN: [id number]  HPI An audio/video tele-health visit is felt to be the most appropriate encounter for this patient at this time. This is a follow up tele-visit via phone. The patient is at home. MD is at office. Prior to scheduling this appointment, our staff discussed the limitations of evaluation and management by telemedicine and the availability of in-person appointments. The patient expressed understanding and agreed to proceed.    [name] is a 48 year old woman who presents for follow-up of CVA  1) CVA -she would benefit from home health -she is receiving support from her ather -she is able to walk to and from the bathroom easily.  -AFO causing her pain, she feels she walks better without it -she has been receiving home therapy  2) HTN: -BP is 147/96 today  3) Anxiety:  -she needs refill of hydroxyzine   4) insomnia: -needs refill of her trazoodone  5) Peripheral neuropathy:  -pain is present in both feet -feels like burning -discussed that she has taken oxycodone  in the past  -says she is not finding relief from Lyrica  -discussed that she is taking a lot of tylenol   6) Muscle spasms: -would like muscle relaxer  -she did not nee  7) Left sided neuropathic pain: -oxycodone  provided her some relief -failed gabapentin , Qutenza , has not tried Lyrica  -does not find Lyrica  helpful- she takes 25mg  Yesterday she walked around the whole [employer] and she was crying due to the aching pain   Pain Inventory Average Pain 10 Pain Right Now 10 My pain is constant, sharp, tingling, and aching   In the last 24 hours, has pain interfered with the following? General activity 7 Relation with others 7 Enjoyment of life 7 What TIME of day is your pain at its worst? morning , daytime, evening, and night Sleep (in general) Poor   Pain is worse with: walking Pain improves with: medication Relief  from Meds: 0   Family History  Problem Relation Age of Onset   Diabetes Mother    Hypertension Mother    Hypertension Father    Diabetes Father    Social History   Socioeconomic History   Marital status: Single    Spouse name: Not on file   Number of children: Not on file   Years of education: Not on file   Highest education level: Not on file  Occupational History   Not on file  Tobacco Use   Smoking status: Never   Smokeless tobacco: Never   Tobacco comments:    Never smoke 10/22/21  Vaping Use   Vaping status: Never Used  Substance and Sexual Activity   Alcohol use: No    Alcohol/week: 0.0 standard drinks of alcohol   Drug use: No   Sexual activity: Not Currently    Birth control/protection: None  Other Topics Concern   Not on file  Social History Narrative   Are you right handed or left handed? right   Are you currently employed ?    What is your current occupation?   Do you live at home alone?   Who lives with you?    What type of home do you live in: 1 story or 2 story?        Social Drivers of Corporate Investment Banker Strain: Low Risk  (04/24/2023)   Received from [facility]   Overall Financial  Resource Strain (CARDIA)    Difficulty of Paying Living Expenses: Not hard at all  Food Insecurity: No Food Insecurity (12/27/2023)   Hunger Vital Sign    Worried About Running Out of Food in the Last Year: Never true    Ran Out of Food in the Last Year: Never true  Transportation Needs: No Transportation Needs (12/27/2023)   PRAPARE - Administrator, Civil Service (Medical): No    Lack of Transportation (Non-Medical): No  Physical Activity: Inactive (04/04/2023)   Exercise Vital Sign    Days of Exercise per Week: 0 days    Minutes of Exercise per Session: 0 min  Stress: Stress Concern Present (04/04/2023)   [employer] of Occupational Health - Occupational Stress Questionnaire    Feeling of Stress : Very much  Social Connections:  Socially Isolated (04/04/2023)   Social Connection and Isolation Panel    Frequency of Communication with Friends and Family: Once a week    Frequency of Social Gatherings with Friends and Family: Never    Attends Religious Services: Never    Database Administrator or Organizations: No    Attends Banker Meetings: Never    Marital Status: Never married   Past Surgical History:  Procedure Laterality Date   BIOPSY  11/05/2021   Procedure: BIOPSY;  Surgeon: Stacia Glendia BRAVO, MD;  Location: [facility] ENDOSCOPY;  Service: Gastroenterology;;   CERVIX LESION DESTRUCTION     CESAREAN SECTION     ESOPHAGOGASTRODUODENOSCOPY (EGD) WITH PROPOFOL  N/A 11/05/2021   Procedure: ESOPHAGOGASTRODUODENOSCOPY (EGD) WITH PROPOFOL ;  Surgeon: Stacia Glendia BRAVO, MD;  Location: [facility] ENDOSCOPY;  Service: Gastroenterology;  Laterality: N/A;   IR CT HEAD LTD  09/24/2022   IR PERCUTANEOUS ART THROMBECTOMY/INFUSION INTRACRANIAL INC DIAG ANGIO  09/24/2022   RADIOLOGY WITH ANESTHESIA N/A 09/24/2022   Procedure: IR WITH ANESTHESIA;  Surgeon: Radiologist, Medication, MD;  Location: [facility] OR;  Service: Radiology;  Laterality: N/A;   RIGHT/LEFT HEART CATH AND CORONARY ANGIOGRAPHY N/A 03/29/2019   Procedure: RIGHT/LEFT HEART CATH AND CORONARY ANGIOGRAPHY;  Surgeon: Rolan Ezra RAMAN, MD;  Location: [facility] INVASIVE CV LAB;  Service: Cardiovascular;  Laterality: N/A;   RIGHT/LEFT HEART CATH AND CORONARY ANGIOGRAPHY N/A 09/20/2021   Procedure: RIGHT/LEFT HEART CATH AND CORONARY ANGIOGRAPHY;  Surgeon: Rolan Ezra RAMAN, MD;  Location: [facility] INVASIVE CV LAB;  Service: Cardiovascular;  Laterality: N/A;   Past Medical History:  Diagnosis Date   Abnormal Pap smear    Acute combined systolic and diastolic heart failure (HCC) 02/19/2019   Wt Readings from Last 3 Encounters:  09/18/21  84.7 kg  09/02/21  84.4 kg  08/31/21  84.2 kg         Acute heart failure (HCC) 01/29/2022   Acute on chronic combined systolic and diastolic CHF (congestive heart  failure) (HCC) 02/19/2019   Acute on chronic congestive heart failure (HCC)    Acute on chronic congestive heart failure (HCC)    Acute on chronic diastolic CHF (congestive heart failure) (HCC) 10/19/2021   Acute on chronic systolic heart failure (HCC) 02/22/2022   Anemia    Asthma    Atrial fibrillation (HCC)    history of paroxysmal atrial fibrillation   CHF (congestive heart failure) (HCC) 11/02/2021   CHF exacerbation (HCC) 10/17/2021   Diabetes mellitus    DKA, type 2 (HCC) 02/06/2019   Elevated troponin 09/30/2019   Heart failure with reduced ejection fraction (HCC)    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  Consider chronic etiology given continued fertility issues   Hypertension    Hypertensive emergency 09/30/2019   Hypertensive urgency 06/02/2022   Major depressive disorder 02/08/2012   Reports that her infertility is a major cause of her depression Reports Prozac  has worked in the past for her.     Migraines    Morbid obesity (HCC) 02/06/2019   Nausea & vomiting 06/26/2022   Nonischemic cardiomyopathy (HCC) 09/22/2021   NSTEMI (non-ST elevated myocardial infarction) (HCC) 02/23/2022   Obstructive sleep apnea 06/30/2015   Polycystic ovarian disease    There were no vitals taken for this visit.  Opioid Risk Score:   Fall Risk Score:  `1  Depression screen [facility] 2/9     04/12/2024   11:16 AM 02/27/2024    1:34 PM 02/16/2024   11:11 AM 02/09/2024   11:43 AM 12/27/2023    3:55 PM 11/01/2023    1:11 PM 07/27/2023    2:59 PM  Depression screen PHQ 2/9  Decreased Interest 1 0 1 1 3 2 3   Down, Depressed, Hopeless 1 0 1 1 2  0 3  PHQ - 2 Score 2 0 2 2 5 2 6   Altered sleeping    2 0 3 3  Tired, decreased energy    1 1 2 3   Change in appetite    0 0 0 0  Feeling bad or failure about yourself     0 1 1 2   Trouble concentrating    0 0 0 2  Moving slowly or fidgety/restless    0 0 0 2  Suicidal thoughts    0 -- 0  0  PHQ-9 Score    5 7 8 18   Difficult doing work/chores    Very difficult   Very difficult     Review of Systems  Gastrointestinal:  Positive for constipation.  Musculoskeletal:  Positive for gait problem.       Lt arm pain, pain in both feet, left leg   All other systems reviewed and are negative.      Objective:   Physical Exam PRIOR EXAM: Gen: no distress, normal appearing HEENT: oral mucosa pink and moist, NCAT Cardio: Reg rate Chest: normal effort, normal rate of breathing Abd: soft, non-distended Ext: no edema Psych: pleasant, normal affect Skin: intact Neuro: Alert and oriented x3, left sided weakness       Assessment & Plan:  1) CVA -prescribed home therapy -provided information regarding [facility] program Prescribing Home Zynex NexWave Stimulator Device and supplies as needed. IFC, NMES and TENS medically necessary Treatment Rx: Daily @ 30-40 minutes per treatment PRN. Zynex NexWave only, no substitutions. Treatment Goals: 1) To reduce and/or eliminate pain 2) To improve functional capacity and Activities of daily living 3) To reduce or prevent the need for oral medications 4) To improve circulation in the injured region 5) To decrease or prevent muscle spasm and muscle atrophy 6) To provide a self-management tool to the patient The patient has not sufficiently improved with conservative care. Numerous studies indexed by Medline and PubMed.gov have shown Neuromuscular, Interferential, and TENS stimulators to reduce pain, improve function, and reduce medication use in injured patients. Continued use of this evidence based, safe, drug free treatment is both reasonable and medically necessary at this time.   -she would benefit from power wheelchair assist feature added to her current manual wheelchair for impaired mobility and ADLs  2) Anxiety: -prescribed hydroxyzine   3) Insomnia: -trazodone  prescribed  4) HTN: -continue home blood pressure  medications -Advised checking BP daily at home and logging results to bring into follow-up appointment with PCP and myself. -Reviewed BP meds today.  -Advised regarding healthy foods that can help lower blood pressure and provided with a list: 1) citrus foods- high in vitamins and minerals 2) salmon and other fatty fish - reduces inflammation and oxylipins 3) swiss chard (leafy green)- high level of nitrates 4) pumpkin seeds- one of the best natural sources of magnesium  5) Beans and lentils- high in fiber, magnesium , and potassium 6) Berries- high in flavonoids 7) Amaranth (whole grain, can be cooked similarly to rice and oats)- high in magnesium  and fiber 8) Pistachios- even more effective at reducing BP than other nuts 9) Carrots- high in phenolic compounds that relax blood vessels and reduce inflammation 10) Celery- contain phthalides that relax tissues of arterial walls 11) Tomatoes- can also improve cholesterol and reduce risk of heart disease 12) Broccoli- good source of magnesium , calcium , and potassium 13) Greek yogurt: high in potassium and calcium  14) Herbs and spices: Celery seed, cilantro, saffron, lemongrass, black cumin, ginseng, cinnamon, cardamom, sweet basil, and ginger 15) Chia and flax seeds- also help to lower cholesterol and blood sugar 16) Beets- high levels of nitrates that relax blood vessels  17) spinach and bananas- high in potassium  -Provided lise of supplements that can help with hypertension:  1) magnesium : one high quality brand is Bioptemizers since it contains all 7 types of magnesium , otherwise over the counter magnesium  gluconate 400mg  is a good option 2) B vitamins 3) vitamin D  4) potassium 5) CoQ10 6) L-arginine 7) Vitamin C 8) Beetroot -Educated that goal BP is 120/80. -Made goal to incorporate some of the above foods into diet.    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 Discussed that this is a capsaicin  patch, stronger than capsaicin  cream. Discussed that it is currently approved for diabetic peripheral neuropathy and post-herpetic neuralgia, but that it has also shown benefit in treating other forms of neuropathy. Provided patient with link to site to learn more about the patch: https://www.qutenza .com/. Discussed that the patch would be placed in office and benefits usually last 3 months. Discussed that unintended exposure to capsaicin  can cause severe irritation of eyes, mucous membranes, respiratory tract, and skin, but that Qutenza  is a local treatment and does not have the systemic side effects of other nerve medications. Discussed that there may be pain, itching, erythema, and decreased sensory function associated with the application of Qutenza . Side effects usually subside within 1 week. A cold pack of analgesic medications can help with these side effects. Blood pressure can also be increased due to pain associated with administration of the patch.   Prescribing Home Zynex NexWave Stimulator Device and supplies as needed. IFC, NMES and TENS medically necessary Treatment Rx: Daily @ 30-40 minutes per treatment PRN. Zynex NexWave only, no substitutions. Treatment Goals: 1) To reduce and/or eliminate pain 2) To improve functional capacity and Activities of daily living 3) To reduce or prevent the need for oral medications 4) To improve circulation in the injured region 5) To decrease  or prevent muscle spasm and muscle atrophy 6) To provide a self-management tool to the patient The patient has not sufficiently improved with conservative care. Numerous studies indexed by Medline and PubMed.gov  have shown Neuromuscular, Interferential, and TENS stimulators to reduce pain, improve function, and reduce medication use in injured patients. Continued use of this evidence based, safe, drug free treatment is both reasonable and medically necessary at this time.   Pain contract and urine sample performed today  -glucose meter and supplies ordered  6) Muscle spasms:  -flexeril  ordered prn  7) chronic left shoulder pain: -oxycodone  switched to tylenol  #3 as per patient's request -f/u with [facility] on the 3rd and can switch back if tylenol  #3 is not effective -lidocaine  patch prescribed  5 minutes spent in discussion of her pain, disucssed that Lyrica  was not helpful, decreasing its dose to 25mg  TID, discussed that she has had benefit from oxycodone  in the past, prescribed 5mg  BID PRN

## 2024-04-16 ENCOUNTER — Other Ambulatory Visit: Payer: Self-pay

## 2024-04-16 ENCOUNTER — Other Ambulatory Visit (HOSPITAL_COMMUNITY): Payer: Self-pay

## 2024-04-16 MED ORDER — GABAPENTIN 300 MG PO CAPS
300.0000 mg | ORAL_CAPSULE | Freq: Every day | ORAL | 0 refills | Status: DC
  Filled 2024-04-16: qty 30, 30d supply, fill #0

## 2024-05-06 ENCOUNTER — Other Ambulatory Visit: Payer: Self-pay | Admitting: Licensed Clinical Social Worker

## 2024-05-06 DIAGNOSIS — G894 Chronic pain syndrome: Secondary | ICD-10-CM

## 2024-05-06 DIAGNOSIS — I152 Hypertension secondary to endocrine disorders: Secondary | ICD-10-CM

## 2024-05-06 DIAGNOSIS — I480 Paroxysmal atrial fibrillation: Secondary | ICD-10-CM

## 2024-05-09 ENCOUNTER — Other Ambulatory Visit: Payer: Self-pay

## 2024-05-09 ENCOUNTER — Other Ambulatory Visit (HOSPITAL_COMMUNITY): Payer: Self-pay

## 2024-05-09 ENCOUNTER — Encounter: Payer: Self-pay | Admitting: Adult Health

## 2024-05-09 ENCOUNTER — Other Ambulatory Visit: Payer: Self-pay | Admitting: Family Medicine

## 2024-05-09 ENCOUNTER — Ambulatory Visit (INDEPENDENT_AMBULATORY_CARE_PROVIDER_SITE_OTHER): Admitting: Adult Health

## 2024-05-09 ENCOUNTER — Encounter

## 2024-05-09 VITALS — BP 138/72 | HR 77 | Temp 97.6°F | Ht <= 58 in | Wt 141.2 lb

## 2024-05-09 DIAGNOSIS — I480 Paroxysmal atrial fibrillation: Secondary | ICD-10-CM | POA: Diagnosis not present

## 2024-05-09 DIAGNOSIS — I428 Other cardiomyopathies: Secondary | ICD-10-CM | POA: Diagnosis not present

## 2024-05-09 DIAGNOSIS — G4733 Obstructive sleep apnea (adult) (pediatric): Secondary | ICD-10-CM | POA: Diagnosis not present

## 2024-05-09 DIAGNOSIS — I5042 Chronic combined systolic (congestive) and diastolic (congestive) heart failure: Secondary | ICD-10-CM

## 2024-05-09 DIAGNOSIS — G4709 Other insomnia: Secondary | ICD-10-CM

## 2024-05-09 DIAGNOSIS — F3289 Other specified depressive episodes: Secondary | ICD-10-CM

## 2024-05-09 DIAGNOSIS — Z23 Encounter for immunization: Secondary | ICD-10-CM | POA: Diagnosis not present

## 2024-05-09 DIAGNOSIS — I5082 Biventricular heart failure: Secondary | ICD-10-CM

## 2024-05-09 MED ORDER — ATORVASTATIN CALCIUM 80 MG PO TABS
80.0000 mg | ORAL_TABLET | Freq: Every day | ORAL | 5 refills | Status: DC
  Filled 2024-05-09: qty 30, 30d supply, fill #0

## 2024-05-09 NOTE — Progress Notes (Signed)
 @Patient  ID: [name], female    DOB: 12/30/1975, 48 y.o.   MRN: [id number]  Chief Complaint  Patient presents with   New Patient (Initial Visit)    Referring provider: Leigh Venetia CROME, MD  HPI: 48 yo female seen for sleep consult 05/09/24 for evaluation of possible sleep apnea.    TEST/EVENTS :  NPSG -81/hr  Discussed the use of AI scribe software for clinical note transcription with the patient, who gave verbal consent to proceed.  History of Present Illness [name] is a 48 year old female with a history of stroke and congestive heart failure who presents for a sleep consult due to concerns about sleep apnea. She was referred by neurology for a sleep consult.  She experiences difficulty falling asleep, attributing it to anxiety. Does not feel she snores any longer since losing weight. Does have some daytime tiredness and fatigue on and off. . Has a history of sleep apnea. . A sleep study in 2016 revealed severe sleep apnea with AHI 81/hr. and oxygen desaturation to 68%. At that time, she weighed 220 pounds and was prescribed CPAP, which she could not tolerate due to claustrophobia. Since then, she has lost more weight following a stroke and now weighs 141 pounds. She believes her snoring has improved with weight loss.  Her medical history includes a stroke in 2024, congestive heart failure, atrial fibrillation, and cardiomyopathy. She experiences chronic pain, which she attributes to the stroke, and uses a wheelchair due to left-sided weakness. Her medications include Pradaxa , Neurontin , oxycodone  for pain, trazodone , melatonin for sleep, hydroxyzine  for anxiety, and Lexapro  for anxiety and depression.  She lives alone and is disabled due to the stroke. She denies smoking, alcohol, and drug use. She reports a disrupted sleep schedule, often going to bed between 6-8 PM and waking up around 11 AM. She attributes her sleep difficulties to anxiety and emotional distress,  stating, 'I've been doing a lot of crying.'  No symptoms suspicious for cataplexy or sleep paralysis.  Was unable to complete Epworth score.  She says if she does have sleep apnea she will not try CPAP again she absolutely hated this.     Allergies  Allergen Reactions   Fish Allergy Other (See Comments) and Swelling    Can tolerate flounder, perch, and trout   Shellfish Allergy Anaphylaxis, Other (See Comments) and Swelling    Airway involvement including EMS - Has Epi-Pen  Other Reaction(s): Other (See Comments)   Metformin  Diarrhea   Metformin  And Related Diarrhea   Nsaids Other (See Comments)    Per PCP, interferes with daily meds (heart failure)  Other Reaction(s): Other (See Comments)   Trulicity  [Dulaglutide ] Nausea And Vomiting and Other (See Comments)    Multiple episodes of vomiting over multiple days    Immunization History  Administered Date(s) Administered   Influenza, Seasonal, Injecte, Preservative Fre 05/09/2024    Past Medical History:  Diagnosis Date   Abnormal Pap smear    Acute combined systolic and diastolic heart failure (HCC) 02/19/2019   Wt Readings from Last 3 Encounters:  09/18/21  84.7 kg  09/02/21  84.4 kg  08/31/21  84.2 kg         Acute heart failure (HCC) 01/29/2022   Acute on chronic combined systolic and diastolic CHF (congestive heart failure) (HCC) 02/19/2019   Acute on chronic congestive heart failure (HCC)    Acute on chronic congestive heart failure (HCC)    Acute on chronic diastolic CHF (  congestive heart failure) (HCC) 10/19/2021   Acute on chronic systolic heart failure (HCC) 02/22/2022   Anemia    Asthma    Atrial fibrillation (HCC)    history of paroxysmal atrial fibrillation   CHF (congestive heart failure) (HCC) 11/02/2021   CHF exacerbation (HCC) 10/17/2021   Diabetes mellitus    DKA, type 2 (HCC) 02/06/2019   Elevated troponin 09/30/2019   Heart failure with reduced ejection fraction (HCC)    History of Left  ventricular thrombus 09/22/2021   Hydrosalpinx 08/21/2009   Dx on Pelvic US  in Jan 2011.  Consider chronic etiology given continued fertility issues   Hypertension    Hypertensive emergency 09/30/2019   Hypertensive urgency 06/02/2022   Major depressive disorder 02/08/2012   Reports that her infertility is a major cause of her depression Reports Prozac  has worked in the past for her.     Migraines    Morbid obesity (HCC) 02/06/2019   Nausea & vomiting 06/26/2022   Nonischemic cardiomyopathy (HCC) 09/22/2021   NSTEMI (non-ST elevated myocardial infarction) (HCC) 02/23/2022   Obstructive sleep apnea 06/30/2015   Polycystic ovarian disease     Tobacco History: Social History   Tobacco Use  Smoking Status Never  Smokeless Tobacco Never  Tobacco Comments   Never smoke 10/22/21   Counseling given: Not Answered Tobacco comments: Never smoke 10/22/21   Outpatient Medications Prior to Visit  Medication Sig Dispense Refill   Accu-Chek Softclix Lancets lancets USE 1  TO CHECK GLUCOSE 4 TIMES DAILY 100 each 2   Accu-Chek Softclix Lancets lancets Use each in the morning, at noon, and at bedtime. 100 each 0   acetaminophen  (TYLENOL ) 500 MG tablet Take 1-2 tablets (500-1,000 mg total) by mouth every 6 (six) hours as needed for mild pain (pain score 1-3) or headache (OR CRAMPING). 30 tablet 5   albuterol  (VENTOLIN  HFA) 108 (90 Base) MCG/ACT inhaler Inhale 2 puffs into the lungs every 6 (six) hours as needed for wheezing or shortness of breath. 18 g 2   Atogepant  (QULIPTA ) 30 MG TABS Take 1 tablet (30 mg total) by mouth daily. 30 tablet 11   atorvastatin  (LIPITOR ) 80 MG tablet Take 1 tablet (80 mg total) by mouth daily after supper. 30 tablet 5   bisacodyl  5 MG EC tablet Take 2 tablets (10 mg total) by mouth every 24 hours as needed for constipation. 60 tablet 0   blood glucose meter kit and supplies Use up to 4 (four) times daily as directed 1 each 0   Blood Glucose Monitoring Suppl  (ACCU-CHEK GUIDE) w/Device KIT Use up to 4 (four) times daily. 1 kit 0   Cholecalciferol  (VITAMIN D ) 125 MCG (5000 UT) CAPS Take one capsule by mouth once daily. 30 capsule 5   dabigatran  (PRADAXA ) 150 MG CAPS capsule Take 1 capsule (150 mg total) by mouth 2 (two) times daily for anticoagulant. 60 capsule 0   empagliflozin  (JARDIANCE ) 25 MG TABS tablet Take 1 tablet (25 mg total) by mouth daily. 30 tablet 5   escitalopram  (LEXAPRO ) 20 MG tablet Take 1 tablet (20 mg total) by mouth at bedtime. 90 tablet 1   gabapentin  (NEURONTIN ) 300 MG capsule Take 1 capsule (300 mg total) by mouth at bedtime. 30 capsule 0   glipiZIDE  (GLUCOTROL ) 5 MG tablet Take 1 tablet (5 mg total) by mouth daily before breakfast. 30 tablet 5   glucose blood (ACCU-CHEK GUIDE) test strip USE  4 TIMES DAILY 100 each 12   hydrOXYzine  (ATARAX ) 10 MG  tablet Take 1 tablet (10 mg total) by mouth 3 (three) times daily as needed for anxiety. 90 tablet 0   levETIRAcetam  (KEPPRA ) 750 MG tablet Take 1 tablet (750 mg total) by mouth 2 (two) times daily for seizures. 60 tablet 0   lidocaine  (LIDODERM ) 5 % Place 1 patch onto the skin daily. Remove & Discard patch within 12 hours or as directed by MD 30 patch 0   losartan  (COZAAR ) 50 MG tablet Take 1 tablet (50 mg total) by mouth daily. 30 tablet 5   melatonin 5 MG TABS Take 1 tablet (5 mg total) by mouth at bedtime. 30 tablet 5   metoprolol  tartrate (LOPRESSOR ) 25 MG tablet Take 1 tablet (25 mg total) by mouth 2 (two) times daily. 60 tablet 5   oxyCODONE  (ROXICODONE ) 5 MG immediate release tablet Take 1 tablet (5 mg total) by mouth 2 (two) times daily as needed for severe pain (pain score 7-10). 60 tablet 0   pregabalin  (LYRICA ) 25 MG capsule Take 1 capsule (25 mg total) by mouth 3 (three) times daily. 90 capsule 3   spironolactone  (ALDACTONE ) 25 MG tablet Take 1 tablet (25 mg total) by mouth daily. 30 tablet 5   tiZANidine  (ZANAFLEX ) 4 MG tablet Take 1 tablet (4 mg total) by mouth every 6  (six) hours as needed for muscle spasms. 30 tablet 0   torsemide  (DEMADEX ) 20 MG tablet Take 1 tablet (20 mg total) by mouth daily. 30 tablet 0   traZODone  (DESYREL ) 150 MG tablet Take 1 tablet (150 mg total) by mouth at bedtime for insomnia. 30 tablet 3   Ubrogepant  (UBRELVY ) 100 MG TABS Take 1 tablet (100 mg total) by mouth daily as needed. 30 tablet 3   No facility-administered medications prior to visit.     Review of Systems:   Constitutional:   No  weight loss, night sweats,  Fevers, chills, +fatigue, or  lassitude.  HEENT:   No headaches,  Difficulty swallowing,  Tooth/dental problems, or  Sore throat,                No sneezing, itching, ear ache, nasal congestion, post nasal drip,   CV:  No chest pain,  Orthopnea, PND, swelling in lower extremities, anasarca, dizziness, palpitations, syncope.   GI  No heartburn, indigestion, abdominal pain, nausea, vomiting, diarrhea, change in bowel habits, loss of appetite, bloody stools.   Resp: .  No chest wall deformity  Skin: no rash or lesions.  GU: no dysuria, change in color of urine, no urgency or frequency.  No flank pain, no hematuria   MS:  No joint pain or swelling.  No decreased range of motion.  No back pain.    Physical Exam  BP 138/72   Pulse 77   Temp 97.6 F (36.4 C) (Temporal)   Ht 4' 9 (1.448 m)   Wt 141 lb 3.2 oz (64 kg)   SpO2 99%   BMI 30.56 kg/m   GEN: A/Ox3; pleasant , NAD, well nourished    HEENT:  [place]/AT, , NOSE-clear, THROAT-clear, no lesions, no postnasal drip or exudate noted.  Class II-III NP airway  NECK:  Supple w/ fair ROM; no JVD; normal carotid impulses w/o bruits; no thyromegaly or nodules palpated; no lymphadenopathy.    RESP  Clear  P & A; w/o, wheezes/ rales/ or rhonchi. no accessory muscle use, no dullness to percussion  CARD:  RRR, no m/r/g, no peripheral edema, pulses intact, no cyanosis or clubbing.  GI:  Soft & nt; nml bowel sounds; no organomegaly or masses detected.    Musco: Warm bil, no deformities or joint swelling noted.   Neuro: alert, no focal deficits noted.    Skin: Warm, no lesions or rashes    Lab Results:  CBC    Component Value Date/Time   WBC 6.5 08/19/2023 1913   RBC 4.35 08/19/2023 1913   HGB 11.9 (L) 08/19/2023 1913   HGB 12.1 11/28/2022 1721   HCT 37.8 08/19/2023 1913   HCT 41.8 11/28/2022 1721   PLT 263 08/19/2023 1913   PLT 335 11/28/2022 1721   MCV 86.9 08/19/2023 1913   MCV 76 (L) 11/28/2022 1721   MCH 27.4 08/19/2023 1913   MCHC 31.5 08/19/2023 1913   RDW 12.9 08/19/2023 1913   RDW 19.9 (H) 11/28/2022 1721   LYMPHSABS 1.8 11/18/2022 1119   LYMPHSABS 2.4 02/05/2019 1729   MONOABS 0.7 11/18/2022 1119   EOSABS 0.0 11/18/2022 1119   EOSABS 0.0 02/05/2019 1729   BASOSABS 0.1 11/18/2022 1119   BASOSABS 0.1 02/05/2019 1729    BMET    Component Value Date/Time   NA 137 02/29/2024 1738   NA 135 11/11/2022 1430   K 3.9 02/29/2024 1738   CL 100 02/29/2024 1738   CO2 27 02/29/2024 1738   GLUCOSE 106 (H) 02/29/2024 1738   BUN 10 02/29/2024 1738   BUN 21 11/11/2022 1430   CREATININE 0.77 02/29/2024 1738   CREATININE 0.71 06/11/2015 1058   CALCIUM  9.5 02/29/2024 1738   GFRNONAA >60 02/29/2024 1738   GFRAA >60 04/03/2020 1429    BNP    Component Value Date/Time   BNP 764.2 (H) 03/08/2023 1532    ProBNP No results found for: PROBNP  Imaging: No results found.  Administration History     None           No data to display          No results found for: NITRICOXIDE      Assessment & Plan:   Assessment and Plan Assessment & Plan Obstructive Sleep Apnea and Insomnia  -history of obstructive sleep apnea with ongoing symptoms of chronic insomnia and daytime fatigue. History severe obstructive sleep apnea with an apnea-hypopnea index of 81 events per hour and oxygen desaturation to 68%. Significant weight loss from 220 lbs to 141 lbs since the last sleep study.  Previous CPAP  intolerance due to claustrophobia.  Has difficulty initiating sleep likely related to anxiety.  Patient education on sleep apnea and potential risks of untreated sleep apnea include cardiovascular complications, stroke, AFib, congestive heart failure, and increased diabetes risk. Order a in lab sleep study at [facility] to evaluate the current status of sleep apnea.  We discussed alternative therapies for CPAP including inspire device if sleep apnea is still present .   Chronic insomnia-continues to have ongoing difficulties with sleep maintenance.   Maintain current medications for insomnia: trazodone  and melatonin. Advise on sleep hygiene: establish a consistent sleep schedule and limit time in bed to 9 hours.  Limit daytime napping.  Congestive Heart Failure and Cardiomyopathy   Congestive heart failure and cardiomyopathy with low ejection fraction.  Patient needs her routine follow-up with cardiology.  Referral for establishing appointment sent per patient request.   Atrial Fibrillation history.  Continue follow-up with cardiology and ongoing management.  Current anticoagulation therapy with Pradaxa .  Stroke with residual Left-Sided Hemiparesis  -currently at baseline.  Inactive due to disability. Stroke in 2024 resulting in left-sided hemiparesis.  Currently wheelchair-bound but able to use a walker.   Chronic anxiety and Depression  -does not appear to be under control. Chronic anxiety and depression contributing to insomnia. Currently on Lexapro  and hydroxyzine .  Ongoing stress with intermittent crying episodes in the setting of chronic illness previous stroke Impact of chronic illness on mental health discussed. Discuss with primary care provider the potential need for adjustment of Lexapro . Consider referral to behavioral health for counseling and support.        Madelin Stank, NP 05/09/2024

## 2024-05-09 NOTE — Patient Instructions (Signed)
 Flu shot today.  Set up for sleep study  Work on healthy weight  Healthy sleep regimen  Use caution with sedating medications  Flu shot today.  Follow up in 6 weeks and As needed

## 2024-05-14 NOTE — Patient Instructions (Signed)
 Visit Information  Thank you for taking time to visit with me today. Please don't hesitate to contact me if I can be of assistance to you before our next scheduled appointment.  Your next care management appointment is by telephone on 11/24 at 2 PM  Please call the care guide team at [phone] if you need to cancel, schedule, or reschedule an appointment.   Please call the Suicide and Crisis Lifeline: 988 go to [name] [address], [place] [phone]) call 911 if you are experiencing a Mental Health or Behavioral Health Crisis or need someone to talk to.  Rolin Kerns, LCSW [place]  [facility], [facility] Clinical Social Worker Direct Dial: [phone]  Fax: [phone] Website: [email or web address] 10:34 AM

## 2024-05-14 NOTE — Patient Outreach (Signed)
 Complex Care Management   Visit Note  05/06/2024  Name:  [name] MRN: [id number] DOB: 08-21-1975  Situation: Referral received for Complex Care Management related to Mental/Behavioral Health diagnosis Depression/Chronic Pain I obtained verbal consent from Patient.  Visit completed with Patient  on the phone  Background:   Past Medical History:  Diagnosis Date   Abnormal Pap smear    Acute combined systolic and diastolic heart failure (HCC) 02/19/2019   Wt Readings from Last 3 Encounters:  09/18/21  84.7 kg  09/02/21  84.4 kg  08/31/21  84.2 kg         Acute heart failure (HCC) 01/29/2022   Acute on chronic combined systolic and diastolic CHF (congestive heart failure) (HCC) 02/19/2019   Acute on chronic congestive heart failure (HCC)    Acute on chronic congestive heart failure (HCC)    Acute on chronic diastolic CHF (congestive heart failure) (HCC) 10/19/2021   Acute on chronic systolic heart failure (HCC) 02/22/2022   Anemia    Asthma    Atrial fibrillation (HCC)    history of paroxysmal atrial fibrillation   CHF (congestive heart failure) (HCC) 11/02/2021   CHF exacerbation (HCC) 10/17/2021   Diabetes mellitus    DKA, type 2 (HCC) 02/06/2019   Elevated troponin 09/30/2019   Heart failure with reduced ejection fraction (HCC)    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  Consider chronic etiology given continued fertility issues   Hypertension    Hypertensive emergency 09/30/2019   Hypertensive urgency 06/02/2022   Major depressive disorder 02/08/2012   Reports that her infertility is a major cause of her depression Reports Prozac  has worked in the past for her.     Migraines    Morbid obesity (HCC) 02/06/2019   Nausea & vomiting 06/26/2022   Nonischemic cardiomyopathy (HCC) 09/22/2021   NSTEMI (non-ST elevated myocardial infarction) (HCC) 02/23/2022   Obstructive sleep apnea 06/30/2015   Polycystic  ovarian disease     Assessment: Patient Reported Symptoms:  Cognitive Cognitive Status: Alert and oriented to person, place, and time, Normal speech and language skills, No symptoms reported Cognitive/Intellectual Conditions Management [RPT]: None reported or documented in medical history or problem list      Neurological Neurological Review of Symptoms: Not assessed    HEENT HEENT Symptoms Reported: Not assessed      Cardiovascular Cardiovascular Symptoms Reported: Not assessed    Respiratory Respiratory Symptoms Reported: Not assesed    Endocrine Endocrine Symptoms Reported: Not assessed    Gastrointestinal Gastrointestinal Symptoms Reported: Not assessed      Genitourinary Genitourinary Symptoms Reported: Not assessed    Integumentary Integumentary Symptoms Reported: Not assessed    Musculoskeletal Musculoskelatal Symptoms Reviewed: Unsteady gait, Difficulty walking, Muscle pain Additional Musculoskeletal Details: Ongoing back/arm pain. Pt agreed to r/s w/c neuro rehab appt Musculoskeletal Management Strategies: Coping strategies, Routine screening, Medical device, Medication therapy      Psychosocial Psychosocial Symptoms Reported: Other Other Psychosocial Conditions: Stress/Chronic Pain Behavioral Management Strategies: Coping strategies, Medication therapy Major Change/Loss/Stressor/Fears (CP): Medical condition, self      05/14/2024    PHQ2-9 Depression Screening   Little interest or pleasure in doing things    Feeling down, depressed, or hopeless    PHQ-2 - Total Score    Trouble falling or staying asleep, or sleeping too much    Feeling tired or having little energy    Poor appetite or overeating  Feeling bad about yourself - or that you are a failure or have let yourself or your family down    Trouble concentrating on things, such as reading the newspaper or watching television    Moving or speaking so slowly that other people could have noticed.  Or the  opposite - being so fidgety or restless that you have been moving around a lot more than usual    Thoughts that you would be better off dead, or hurting yourself in some way    PHQ2-9 Total Score    If you checked off any problems, how difficult have these problems made it for you to do your work, take care of things at home, or get along with other people    Depression Interventions/Treatment      There were no vitals filed for this visit.  Medications Reviewed Today     Reviewed by Tammy Rolin BIRCH, LCSW (Social Worker) on 05/14/24 at 1023  Med List Status: <None>   Medication Order Taking? Sig Documenting Provider Last Dose Status Informant  Accu-Chek Softclix Lancets lancets [id number]  USE 1  TO CHECK GLUCOSE 4 TIMES DAILY Chambliss, Tammy CROME, MD  Active Father           Med Note [name], [name]   Wed Nov 01, 2023  1:20 PM) Have to pay out of pocket fees   Accu-Chek Softclix Lancets lancets [id number]  Use each in the morning, at noon, and at bedtime. Tammy Tammy SQUIBB, MD  Active   acetaminophen  (TYLENOL ) 500 MG tablet [id number]  Take 1-2 tablets (500-1,000 mg total) by mouth every 6 (six) hours as needed for mild pain (pain score 1-3) or headache (OR CRAMPING). Tammy Venetia CROME, MD  Active   albuterol  (VENTOLIN  HFA) 108 (90 Base) MCG/ACT inhaler [id number]  Inhale 2 puffs into the lungs every 6 (six) hours as needed for wheezing or shortness of breath. Christia Budds, MD  Active   Atogepant  (QULIPTA ) 30 MG TABS [id number]  Take 1 tablet (30 mg total) by mouth daily. Tammy Venetia CROME, MD  Active   atorvastatin  (LIPITOR ) 80 MG tablet [id number]  Take 1 tablet (80 mg total) by mouth daily after supper. Everhart, Kirstie, DO  Active   bisacodyl  5 MG EC tablet [id number]  Take 2 tablets (10 mg total) by mouth every 24 hours as needed for constipation.   Active            Med Note (Gurfateh Mcclain [name]   Wed Nov 01, 2023  1:22 PM) Need a refill   blood glucose meter kit and supplies [id number]  Use  up to 4 (four) times daily as directed [name], Tammy SQUIBB, MD  Active   Blood Glucose Monitoring Suppl (ACCU-CHEK GUIDE) w/Device KIT [id number]  Use up to 4 (four) times daily. Tammy Tammy SQUIBB, MD  Active   Discontinued 07/01/22 [phone]          Med Note>> Margrette Levorn POUR, [facility]   07/01/2022  9:52 AM changed to Toprol  XL    Cholecalciferol  (VITAMIN [name] ) 125 MCG (5000 UT) CAPS [id number]  Take one capsule by mouth once daily. [name], Tammy BIRCH, MD  Active   dabigatran  (PRADAXA ) 150 MG CAPS capsule [id number]  Take 1 capsule (150 mg total) by mouth 2 (two) times daily for anticoagulant. Christia Budds, MD  Active   empagliflozin  (JARDIANCE ) 25 MG TABS tablet [id number]  Take 1 tablet (25 mg total) by mouth daily. [name], Tammy BIRCH, MD  Active  escitalopram  (LEXAPRO ) 20 MG tablet [id number]  Take 1 tablet (20 mg total) by mouth at bedtime. Christia Budds, MD  Active   gabapentin  (NEURONTIN ) 300 MG capsule [id number]  Take 1 capsule (300 mg total) by mouth at bedtime. Everhart, Kirstie, DO  Active   glipiZIDE  (GLUCOTROL ) 5 MG tablet [id number]  Take 1 tablet (5 mg total) by mouth daily before breakfast. [name], Tammy BIRCH, MD  Active   glucose blood (ACCU-CHEK GUIDE) test strip [id number]  USE  4 TIMES DAILY [name], Anupa, DO  Active Father  hydrOXYzine  (ATARAX ) 10 MG tablet [id number]  Take 1 tablet (10 mg total) by mouth 3 (three) times daily as needed for anxiety. Tammy Tammy SQUIBB, MD  Active   levETIRAcetam  (KEPPRA ) 750 MG tablet [id number]  Take 1 tablet (750 mg total) by mouth 2 (two) times daily for seizures.   Active   lidocaine  (LIDODERM ) 5 % [id number]  Place 1 patch onto the skin daily. Remove & Discard patch within 12 hours or as directed by MD [name], Tammy SQUIBB, MD  Active   losartan  (COZAAR ) 50 MG tablet [id number]  Take 1 tablet (50 mg total) by mouth daily. Christia Budds, MD  Active   melatonin 5 MG TABS [id number]  Take 1 tablet (5 mg total) by mouth at bedtime. Christia Budds, MD  Active    metoprolol  tartrate (LOPRESSOR ) 25 MG tablet [id number]  Take 1 tablet (25 mg total) by mouth 2 (two) times daily. [name], Tammy BIRCH, MD  Active   oxyCODONE  (ROXICODONE ) 5 MG immediate release tablet [id number]  Take 1 tablet (5 mg total) by mouth 2 (two) times daily as needed for severe pain (pain score 7-10). Tammy Tammy SQUIBB, MD  Active   pregabalin  (LYRICA ) 25 MG capsule [id number]  Take 1 capsule (25 mg total) by mouth 3 (three) times daily. Tammy Tammy SQUIBB, MD  Active   spironolactone  (ALDACTONE ) 25 MG tablet [id number]  Take 1 tablet (25 mg total) by mouth daily. [name], Tammy BIRCH, MD  Active   tiZANidine  (ZANAFLEX ) 4 MG tablet [id number]  Take 1 tablet (4 mg total) by mouth every 6 (six) hours as needed for muscle spasms. Everhart, Kirstie, DO  Active   torsemide  (DEMADEX ) 20 MG tablet [id number]  Take 1 tablet (20 mg total) by mouth daily. Christia Budds, MD  Active Father  traZODone  (DESYREL ) 150 MG tablet [id number]  Take 1 tablet (150 mg total) by mouth at bedtime for insomnia. Everhart, Kirstie, DO  Active   Ubrogepant  (UBRELVY ) 100 MG TABS [id number]  Take 1 tablet (100 mg total) by mouth daily as needed. Tammy Venetia CROME, MD  Active             Recommendation:   Continue Current Plan of Care  Follow Up Plan:   Telephone follow-up in 1 month  Rolin Tammy HUGHS [facility]  [facility], [facility] Clinical Social Worker Direct Dial: [phone]  Fax: [phone] Website: [email or web address] 10:32 AM

## 2024-05-15 ENCOUNTER — Other Ambulatory Visit (HOSPITAL_COMMUNITY): Payer: Self-pay

## 2024-05-15 ENCOUNTER — Telehealth: Payer: Self-pay

## 2024-05-15 ENCOUNTER — Other Ambulatory Visit: Payer: Self-pay | Admitting: Physical Medicine and Rehabilitation

## 2024-05-15 MED ORDER — OXYCODONE HCL 5 MG PO TABS
5.0000 mg | ORAL_TABLET | Freq: Three times a day (TID) | ORAL | 0 refills | Status: DC | PRN
  Filled 2024-05-15: qty 60, 20d supply, fill #0

## 2024-05-15 NOTE — Telephone Encounter (Signed)
 [name] OT with [facility] [phone]:  Yesterday the occupational therapist visited [name] (Phone [phone])  for discharge.  The physical therapy will continue. She has requested an order for a Presenter, broadcasting.   [name] OT also stated yesterday the patients diastolic  blood pressure was in the 160's. She  advised [name] to monitor her BP.  Today: Issac OT has been advised to report the BP to the PCP.  Dr. Lorilee will be informed.   I have tried to reach the [name] twice with no answer. (A generic voice message was left for her to call PM&R back ,for an update. Because there was no voice ID on the recording).

## 2024-05-15 NOTE — Telephone Encounter (Signed)
 I was able to reach [name]. A therapist has visited her today. She stated her BP was still up. She not remember what the BP reading. Patient has been advised to call her PCP.   [name] would like speak to Dr. Lorilee to have the Oxycodone  increased. Call back phone [phone] .

## 2024-05-15 NOTE — Telephone Encounter (Signed)
 Aldona OT with [facility] calls nurse line to report abnormal blood pressure.   She reports BP yesterday was 160/100. She reports the patient stated when she has a migraine her blood pressure increases.   She reports no symptoms. BP at pulmonology visit on 10/16 was 138/72.  I attempted to call patient to discuss and set up an apt, however no answer.   Will await her return call.

## 2024-05-17 ENCOUNTER — Telehealth: Payer: Self-pay

## 2024-05-17 NOTE — Progress Notes (Signed)
 Complex Care Management Note Care Guide Note  05/17/2024 Name: [name] MRN: [id number] DOB: 08/01/1975   Complex Care Management Outreach Attempts: An unsuccessful telephone outreach was attempted today to offer the patient information about available complex care management services.  Follow Up Plan:  Additional outreach attempts will be made to offer the patient complex care management information and services.   Encounter Outcome:  No Answer-Left voicemail  Leotis Rase [facility], [facility] Guide  Direct Dial: [phone]  Fax [phone]

## 2024-05-20 ENCOUNTER — Other Ambulatory Visit (HOSPITAL_COMMUNITY): Payer: Self-pay

## 2024-05-20 NOTE — Progress Notes (Unsigned)
 Complex Care Management Note Care Guide Note  05/20/2024 Name: [name] MRN: [id number] DOB: 1976-06-03   Complex Care Management Outreach Attempts: A second unsuccessful outreach was attempted today to offer the patient with information about available complex care management services.  Follow Up Plan:  Additional outreach attempts will be made to offer the patient complex care management information and services.   Encounter Outcome:  No Answer-Left voicemail  Leotis Rase [facility], [facility] Guide  Direct Dial: [phone]  Fax [phone]

## 2024-05-21 ENCOUNTER — Other Ambulatory Visit (HOSPITAL_COMMUNITY): Payer: Self-pay

## 2024-05-21 NOTE — Progress Notes (Signed)
 Complex Care Management Note  Care Guide Note 05/21/2024 Name: OTISHA SPICKLER MRN: [id number] DOB: 01-25-1976  Lavona JULIANNA Molt is a 48 y.o. year old female who sees Everhart, Kirstie, DO for primary care. I reached out to Lavona JULIANNA Molt by phone today to offer complex care management services.  Ms. Bergstresser was given information about Complex Care Management services today including:   The Complex Care Management services include support from the care team which includes your Nurse Care Manager, Clinical Social Worker, or Pharmacist.  The Complex Care Management team is here to help remove barriers to the health concerns and goals most important to you. Complex Care Management services are voluntary, and the patient may decline or stop services at any time by request to their care team member.   Complex Care Management Consent Status: Patient agreed to services and verbal consent obtained.   Follow up plan:  Telephone appointment with complex care management team member scheduled for:  06/17/24 @ 3 PM  Encounter Outcome:  Patient Scheduled  Leotis Rase [facility], [facility] Guide  Direct Dial: [phone]  Fax [phone]

## 2024-05-22 ENCOUNTER — Telehealth: Payer: Self-pay

## 2024-05-22 NOTE — Telephone Encounter (Signed)
 [facility] called for a re-certification of PT: once a week for 8 weeks. To work on strength, safety & transfer. Okay given.  Yesterday afternoon he reported Tylene's BP was 200/118. Patient was advised to go hospital.   I called Aanya today she did not go to the hospital yesterday. She said her BP was 150/90 today. Patient has been advised to go to the hospital, or call PCP if BP continues to be uncontrolled. (Dr. Lorilee is not in the office today).

## 2024-05-23 NOTE — Telephone Encounter (Signed)
 Please schedule a Telephone visit. If you have not already.

## 2024-05-24 ENCOUNTER — Encounter: Attending: Physical Medicine and Rehabilitation | Admitting: Physical Medicine & Rehabilitation

## 2024-05-24 ENCOUNTER — Encounter: Payer: Self-pay | Admitting: Physical Medicine & Rehabilitation

## 2024-05-24 VITALS — BP 166/92 | HR 89 | Ht <= 58 in

## 2024-05-24 DIAGNOSIS — I69954 Hemiplegia and hemiparesis following unspecified cerebrovascular disease affecting left non-dominant side: Secondary | ICD-10-CM | POA: Diagnosis present

## 2024-05-24 MED ORDER — SODIUM CHLORIDE (PF) 0.9 % IJ SOLN
8.0000 mL | Freq: Once | INTRAMUSCULAR | Status: AC
  Administered 2024-05-24: 8 mL

## 2024-05-24 MED ORDER — ONABOTULINUMTOXINA 100 UNITS IJ SOLR
400.0000 [IU] | Freq: Once | INTRAMUSCULAR | Status: AC
  Administered 2024-05-24: 400 [IU] via INTRAMUSCULAR

## 2024-05-24 NOTE — Progress Notes (Signed)
 Botox  Injection for spasticity using needle EMG guidance  Dilution: 50 Units/ml Indication: Severe spasticity which interferes with ADL,mobility and/or  hygiene and is unresponsive to medication management and other conservative care Informed consent was obtained after describing risks and benefits of the procedure with the patient. This includes bleeding, bruising, infection, excessive weakness, or medication side effects. A REMS form is on file and signed. Needle: 27g 1 for UE and 25 g 2 for LE needle electrode Number of units per muscle Botox   400 units total Left upper limb Biceps 50 units Brachialis 50units BR 50 nits FCR 50 units-  FDS 50 units  FDP 50 units use Estim to isolate dig 4 and 5 FPL 25 units   Left lower limb Posterior tibialis 75 units All injections were done after obtaining appropriate EMG activity and after negative drawback for blood. The patient tolerated the procedure well. Post procedure instructions were given. A followup appointment was made to see Dr Lorilee in 6wks

## 2024-05-24 NOTE — Patient Instructions (Signed)

## 2024-05-26 ENCOUNTER — Emergency Department (HOSPITAL_COMMUNITY)
Admission: EM | Admit: 2024-05-26 | Discharge: 2024-05-27 | Disposition: A | Discharge status: Home / Self Care | Attending: Emergency Medicine | Admitting: Emergency Medicine

## 2024-05-26 ENCOUNTER — Emergency Department (HOSPITAL_COMMUNITY)

## 2024-05-26 ENCOUNTER — Encounter (HOSPITAL_COMMUNITY): Payer: Self-pay

## 2024-05-26 DIAGNOSIS — R079 Chest pain, unspecified: Secondary | ICD-10-CM | POA: Diagnosis present

## 2024-05-26 DIAGNOSIS — R42 Dizziness and giddiness: Secondary | ICD-10-CM | POA: Diagnosis not present

## 2024-05-26 DIAGNOSIS — M542 Cervicalgia: Secondary | ICD-10-CM | POA: Diagnosis not present

## 2024-05-26 DIAGNOSIS — R0789 Other chest pain: Secondary | ICD-10-CM | POA: Insufficient documentation

## 2024-05-26 LAB — COMPREHENSIVE METABOLIC PANEL WITH GFR
ALT: 10 U/L (ref 0–44)
AST: 19 U/L (ref 15–41)
Albumin: 3.3 g/dL — ABNORMAL LOW (ref 3.5–5.0)
Alkaline Phosphatase: 84 U/L (ref 38–126)
Anion gap: 11 (ref 5–15)
BUN: 12 mg/dL (ref 6–20)
CO2: 23 mmol/L (ref 22–32)
Calcium: 8.8 mg/dL — ABNORMAL LOW (ref 8.9–10.3)
Chloride: 103 mmol/L (ref 98–111)
Creatinine, Ser: 0.7 mg/dL (ref 0.44–1.00)
GFR, Estimated: >60 mL/min (ref >60)
Glucose, Bld: 113 mg/dL — ABNORMAL HIGH (ref 70–99)
Potassium: 3.9 mmol/L (ref 3.5–5.1)
Sodium: 137 mmol/L (ref 135–145)
Total Bilirubin: 1.4 mg/dL — ABNORMAL HIGH (ref 0.0–1.2)
Total Protein: 7.1 g/dL (ref 6.5–8.1)

## 2024-05-26 LAB — CBC WITH DIFFERENTIAL/PLATELET
Abs Immature Granulocytes: 0.05 K/uL (ref 0.00–0.07)
Basophils Absolute: 0.1 K/uL (ref 0.0–0.1)
Basophils Relative: 1 %
Eosinophils Absolute: 0.1 K/uL (ref 0.0–0.5)
Eosinophils Relative: 1 %
HCT: 34 % — ABNORMAL LOW (ref 36.0–46.0)
Hemoglobin: 10.7 g/dL — ABNORMAL LOW (ref 12.0–15.0)
Immature Granulocytes: 1 %
Lymphocytes Relative: 22 %
Lymphs Abs: 2.3 K/uL (ref 0.7–4.0)
MCH: 26.4 pg (ref 26.0–34.0)
MCHC: 31.5 g/dL (ref 30.0–36.0)
MCV: 83.7 fL (ref 80.0–100.0)
Monocytes Absolute: 0.6 K/uL (ref 0.1–1.0)
Monocytes Relative: 5 %
Neutro Abs: 7.5 K/uL (ref 1.7–7.7)
Neutrophils Relative %: 70 %
Platelets: 257 K/uL (ref 150–400)
RBC: 4.06 MIL/uL (ref 3.87–5.11)
RDW: 13.5 % (ref 11.5–15.5)
WBC: 10.5 K/uL (ref 4.0–10.5)
nRBC: 0 % (ref 0.0–0.2)

## 2024-05-26 LAB — TROPONIN I (HIGH SENSITIVITY)
Troponin I (High Sensitivity): 6 ng/L (ref <18)
Troponin I (High Sensitivity): 6 ng/L (ref <18)

## 2024-05-26 MED ORDER — MECLIZINE HCL 25 MG PO TABS
25.0000 mg | ORAL_TABLET | Freq: Three times a day (TID) | ORAL | 0 refills | Status: AC | PRN
  Filled 2024-05-26: qty 30, 10d supply, fill #0

## 2024-05-26 MED ORDER — OXYCODONE-ACETAMINOPHEN 5-325 MG PO TABS
2.0000 | ORAL_TABLET | Freq: Once | ORAL | Status: AC
  Administered 2024-05-26: 2 via ORAL
  Filled 2024-05-26: qty 2

## 2024-05-26 MED ORDER — MECLIZINE HCL 25 MG PO TABS
25.0000 mg | ORAL_TABLET | Freq: Once | ORAL | Status: AC
  Administered 2024-05-26: 25 mg via ORAL
  Filled 2024-05-26: qty 1

## 2024-05-26 NOTE — ED Triage Notes (Signed)
 BIB EMS from home r/t increasing right CP, Dizziness, Weakness that started a few days ago. Hx stroke with left side deficits. A&Ox4.

## 2024-05-26 NOTE — ED Provider Notes (Signed)
 [place] EMERGENCY DEPARTMENT AT [facility] Provider Note   CSN: [id number] Arrival date & time: 05/26/24  1738     Patient presents with: Chest Pain and Dizziness   [name] is a 48 y.o. female.   Patient to ED with complaint of left sided chest pain, midline and bilateral neck pain and dizziness described as that she is moving and not her surroundings. Symptoms started 2 days ago and have been persistent without anything that makes them worse or better. She is able to sleep at night without difficulty. No SOB, fever, cough, nausea, vomiting. She has a history of CVA with persistent left hemiplegia.   The history is provided by the patient. No language interpreter was used.  Chest Pain Associated symptoms: dizziness   Dizziness Associated symptoms: chest pain        Prior to Admission medications   Medication Sig Start Date End Date Taking? Authorizing Provider  meclizine  (ANTIVERT ) 25 MG tablet Take 1 tablet (25 mg total) by mouth 3 (three) times daily as needed for dizziness. 05/26/24  Yes Odell Balls, PA-C  Accu-Chek Softclix Lancets lancets USE 1  TO CHECK GLUCOSE 4 TIMES DAILY 01/12/23   Jeanelle Layman CROME, MD  Accu-Chek Softclix Lancets lancets Use each in the morning, at noon, and at bedtime. 07/04/23   Raulkar, Sven SQUIBB, MD  acetaminophen  (TYLENOL ) 500 MG tablet Take 1-2 tablets (500-1,000 mg total) by mouth every 6 (six) hours as needed for mild pain (pain score 1-3) or headache (OR CRAMPING). 01/31/24   Leigh Venetia CROME, MD  albuterol  (VENTOLIN  HFA) 108 (90 Base) MCG/ACT inhaler Inhale 2 puffs into the lungs every 6 (six) hours as needed for wheezing or shortness of breath. 01/23/23   Christia Budds, MD  Atogepant  (QULIPTA ) 30 MG TABS Take 1 tablet (30 mg total) by mouth daily. 01/31/24   Leigh Venetia CROME, MD  atorvastatin  (LIPITOR ) 80 MG tablet Take 1 tablet (80 mg total) by mouth daily after supper. 05/09/24   Everhart, Kirstie, DO  bisacodyl  5 MG EC  tablet Take 2 tablets (10 mg total) by mouth every 24 hours as needed for constipation. 06/02/23     blood glucose meter kit and supplies Use up to 4 (four) times daily as directed 07/04/23   Raulkar, Sven SQUIBB, MD  Blood Glucose Monitoring Suppl (ACCU-CHEK GUIDE) w/Device KIT Use up to 4 (four) times daily. 07/04/23   Raulkar, Sven SQUIBB, MD  Cholecalciferol  (VITAMIN D ) 125 MCG (5000 UT) CAPS Take one capsule by mouth once daily. 04/17/23   McDiarmid, Krystal BIRCH, MD  dabigatran  (PRADAXA ) 150 MG CAPS capsule Take 1 capsule (150 mg total) by mouth 2 (two) times daily for anticoagulant. 09/28/23   Christia Budds, MD  empagliflozin  (JARDIANCE ) 25 MG TABS tablet Take 1 tablet (25 mg total) by mouth daily. 04/17/23   McDiarmid, Krystal BIRCH, MD  escitalopram  (LEXAPRO ) 20 MG tablet Take 1 tablet (20 mg total) by mouth at bedtime. 08/28/23   Christia Budds, MD  gabapentin  (NEURONTIN ) 300 MG capsule Take 1 capsule (300 mg total) by mouth at bedtime. 04/16/24   Everhart, Kirstie, DO  glipiZIDE  (GLUCOTROL ) 5 MG tablet Take 1 tablet (5 mg total) by mouth daily before breakfast. 04/17/23   McDiarmid, Krystal BIRCH, MD  glucose blood (ACCU-CHEK GUIDE) test strip USE  4 TIMES DAILY 01/12/23   Ganta, Anupa, DO  hydrOXYzine  (ATARAX ) 10 MG tablet Take 1 tablet (10 mg total) by mouth 3 (three) times daily as needed for  anxiety. 03/18/24   Raulkar, Sven SQUIBB, MD  levETIRAcetam  (KEPPRA ) 750 MG tablet Take 1 tablet (750 mg total) by mouth 2 (two) times daily for seizures. 06/02/23     lidocaine  (LIDODERM ) 5 % Place 1 patch onto the skin daily. Remove & Discard patch within 12 hours or as directed by MD 08/31/23   Lorilee, Sven SQUIBB, MD  losartan  (COZAAR ) 50 MG tablet Take 1 tablet (50 mg total) by mouth daily. 01/11/24   Christia Budds, MD  melatonin 5 MG TABS Take 1 tablet (5 mg total) by mouth at bedtime. 01/11/24   Christia Budds, MD  metoprolol  tartrate (LOPRESSOR ) 25 MG tablet Take 1 tablet (25 mg total) by mouth 2 (two) times daily. 04/17/23    McDiarmid, Krystal BIRCH, MD  oxyCODONE  (ROXICODONE ) 5 MG immediate release tablet Take 1 tablet (5 mg total) by mouth 3 (three) times daily as needed for severe pain (pain score 7-10). 05/15/24   Raulkar, Sven SQUIBB, MD  pregabalin  (LYRICA ) 25 MG capsule Take 1 capsule (25 mg total) by mouth 3 (three) times daily. 04/15/24   Raulkar, Sven SQUIBB, MD  spironolactone  (ALDACTONE ) 25 MG tablet Take 1 tablet (25 mg total) by mouth daily. 04/17/23   McDiarmid, Krystal BIRCH, MD  tiZANidine  (ZANAFLEX ) 4 MG tablet Take 1 tablet (4 mg total) by mouth every 6 (six) hours as needed for muscle spasms. 02/06/24   Everhart, Kirstie, DO  torsemide  (DEMADEX ) 20 MG tablet Take 1 tablet (20 mg total) by mouth daily. 01/20/23   Christia Budds, MD  traZODone  (DESYREL ) 150 MG tablet Take 1 tablet (150 mg total) by mouth at bedtime for insomnia. 03/19/24   Everhart, Kirstie, DO  Ubrogepant  (UBRELVY ) 100 MG TABS Take 1 tablet (100 mg total) by mouth daily as needed. 08/03/23   Leigh Venetia CROME, MD  carvedilol  (COREG ) 3.125 MG tablet Take 1 tablet (3.125 mg total) by mouth 2 (two) times daily with a meal. 02/27/22 07/01/22  Drusilla Sabas RAMAN, MD    Allergies: Fish allergy, Shellfish allergy, Metformin , Metformin  and related, Nsaids, and Trulicity  [dulaglutide ]    Review of Systems  Cardiovascular:  Positive for chest pain.  Neurological:  Positive for dizziness.    Updated Vital Signs BP (!) 163/102   Pulse 69   Temp 98.5 F (36.9 C) (Oral)   Resp 10   Ht 4' 10.8 (1.494 m)   Wt 64 kg   SpO2 100%   BMI 28.67 kg/m   Physical Exam Vitals and nursing note reviewed.  Constitutional:      Appearance: She is well-developed.  Eyes:     Extraocular Movements: Extraocular movements intact.     Pupils: Pupils are equal, round, and reactive to light.     Comments: No visualized horizontal or vertical nystagmus  Neck:   Cardiovascular:     Rate and Rhythm: Normal rate.  Pulmonary:     Effort: Pulmonary effort is normal.     Breath  sounds: Normal breath sounds. No wheezing, rhonchi or rales.  Abdominal:     Palpations: Abdomen is soft.     Tenderness: There is no abdominal tenderness.  Musculoskeletal:     Cervical back: Normal range of motion.     Comments: Left hemiplegia. No extremity redness or swelling.   Neurological:     Mental Status: She is alert and oriented to person, place, and time.     (all labs ordered are listed, but only abnormal results are displayed) Labs Reviewed  CBC WITH DIFFERENTIAL/PLATELET -  Abnormal; Notable for the following components:      Result Value   Hemoglobin 10.7 (*)    HCT 34.0 (*)    All other components within normal limits  COMPREHENSIVE METABOLIC PANEL WITH GFR - Abnormal; Notable for the following components:   Glucose, Bld 113 (*)    Calcium  8.8 (*)    Albumin 3.3 (*)    Total Bilirubin 1.4 (*)    All other components within normal limits  TROPONIN I (HIGH SENSITIVITY)  TROPONIN I (HIGH SENSITIVITY)    EKG: None  Radiology: CT Head Wo Contrast Result Date: 05/26/2024 EXAM: CT HEAD WITHOUT CONTRAST 05/26/2024 10:23:17 PM TECHNIQUE: CT of the head was performed without the administration of intravenous contrast. Automated exposure control, iterative reconstruction, and/or weight based adjustment of the mA/kV was utilized to reduce the radiation dose to as low as reasonably achievable. COMPARISON: 06/29/2024 CLINICAL HISTORY: Headache, neuro deficit. FINDINGS: BRAIN AND VENTRICLES: No acute hemorrhage. Prior right MCA infarct with encephalomalacia, stable. No evidence of acute infarct. No hydrocephalus. No extra-axial collection. No mass effect or midline shift. ORBITS: No acute abnormality. SINUSES: No acute abnormality. SOFT TISSUES AND SKULL: No acute soft tissue abnormality. No skull fracture. IMPRESSION: 1. No acute intracranial abnormality. 2. 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 Electronically signed by: Franky Crease MD 05/26/2024 10:27  PM EST RP Workstation: [id number]   DG Chest Portable 1 View Result Date: 05/26/2024 EXAM: 1 VIEW(S) XRAY OF THE CHEST 05/26/2024 06:44:00 PM COMPARISON: 02/29/2024 CLINICAL HISTORY: chest pain FINDINGS: LUNGS AND PLEURA: No focal pulmonary opacity. No pulmonary edema. No pleural effusion. No pneumothorax. HEART AND MEDIASTINUM: Stable cardiomediastinal silhouette compared to the prior study of 02/29/2024. BONES AND SOFT TISSUES: No acute osseous abnormality. IMPRESSION: 1. No acute cardiopulmonary abnormality. Electronically signed by: Lynwood Seip MD 05/26/2024 06:49 PM EST RP Workstation: [id number]     Procedures   Medications Ordered in the ED  oxyCODONE -acetaminophen  (PERCOCET/ROXICET) 5-325 MG per tablet 2 tablet (2 tablets Oral Given 05/26/24 1826)  meclizine  (ANTIVERT ) tablet 25 mg (25 mg Oral Given 05/26/24 2300)    Clinical Course as of 05/26/24 2329  Sun May 26, 2024  1842 Patient to ED for evaluation of pain in the left chest and across the shoulders and neck. She also reports dizziness. No nausea, vomiting, headache. H/O CVA with residual left hemiplegia.   EKG reviewed and shows no ischemia. Will provide pain relief, review labs and re-evaluate.   [SU]  2104 On re-evaluation, the pain is improved but not resolved. Labs are reassuring. Hgb 10.7. No evidence infection, normal renal function, normal electrolytes. VSS. She reports persistent dizziness. CT ordered and Meclizine  25 mg for symptomatic relief. Neuro exam limited due to chronic deficits but no concerning findings. [SU]  2327 Meclizine  delayed in being given. However, head CT negative for new findings. Discussed use of Meclizine  at home. Use only wheelchair for mobility to avoid fall and further injury. Recommended PCP recheck in 3 days to ensure she is getting better. Discussed return precautions.  The patient has been reviewed with attending, Dr. Pamella. [SU]    Clinical Course User Index [SU] Odell Balls, PA-C                                  Medical Decision Making Amount and/or Complexity of Data Reviewed Labs: ordered. Radiology: ordered.  Risk Prescription drug management.        Final  diagnoses:  Chest wall pain  Vertigo    ED Discharge Orders          Ordered    meclizine  (ANTIVERT ) 25 MG tablet  3 times daily PRN        05/26/24 2316               Odell Balls, PA-C 05/26/24 2329    Pamella Ozell LABOR, DO 05/28/24 0118

## 2024-05-26 NOTE — ED Notes (Signed)
 Pt removed from bed pan. Urine sample collected. At bedside.

## 2024-05-26 NOTE — Discharge Instructions (Addendum)
 As we discussed, your lab studies, xrays, EKG are all reassuring and no acute or new condition is identified. You are encouraged to continue your oxycodone  for the chest wall and shoulder pain. The dizziness can be treated with Meclizine  up to 3 times daily. Use your wheelchair only for mobility to guard against fall until dizziness resolves.   If you develop worsening symptoms of pain or dizziness, or have new symptoms of concerns, return to the ED at any time. Otherwise, follow up with your doctor in 2-3 days for recheck to ensure you are improving.

## 2024-05-27 ENCOUNTER — Other Ambulatory Visit (HOSPITAL_COMMUNITY): Payer: Self-pay

## 2024-05-30 ENCOUNTER — Other Ambulatory Visit (HOSPITAL_COMMUNITY): Payer: Self-pay

## 2024-05-31 ENCOUNTER — Ambulatory Visit

## 2024-05-31 ENCOUNTER — Ambulatory Visit: Admitting: Family Medicine

## 2024-05-31 DIAGNOSIS — R262 Difficulty in walking, not elsewhere classified: Secondary | ICD-10-CM | POA: Insufficient documentation

## 2024-05-31 DIAGNOSIS — R2681 Unsteadiness on feet: Secondary | ICD-10-CM | POA: Insufficient documentation

## 2024-05-31 DIAGNOSIS — I639 Cerebral infarction, unspecified: Secondary | ICD-10-CM | POA: Insufficient documentation

## 2024-05-31 DIAGNOSIS — R293 Abnormal posture: Secondary | ICD-10-CM | POA: Insufficient documentation

## 2024-05-31 DIAGNOSIS — M6281 Muscle weakness (generalized): Secondary | ICD-10-CM | POA: Insufficient documentation

## 2024-05-31 NOTE — Therapy (Signed)
 OUTPATIENT PHYSICAL THERAPY WHEELCHAIR EVALUATION   Patient Name: [name] MRN: [id number] DOB:1976/07/04, 48 y.o., female Today's Date: 05/31/2024  END OF SESSION:  PT End of Session - 05/31/24 1433     Visit Number 1    Number of Visits 1    Authorization Type Medicare/Medicaid    PT Start Time 1431    PT Stop Time 1515    PT Time Calculation (min) 44 min    Activity Tolerance Patient tolerated treatment well    Behavior During Therapy [facility] for tasks assessed/performed          Past Medical History:  Diagnosis Date   Abnormal Pap smear    Acute combined systolic and diastolic heart failure (HCC) 02/19/2019   Wt Readings from Last 3 Encounters:  09/18/21  84.7 kg  09/02/21  84.4 kg  08/31/21  84.2 kg         Acute heart failure (HCC) 01/29/2022   Acute on chronic combined systolic and diastolic CHF (congestive heart failure) (HCC) 02/19/2019   Acute on chronic congestive heart failure (HCC)    Acute on chronic congestive heart failure (HCC)    Acute on chronic diastolic CHF (congestive heart failure) (HCC) 10/19/2021   Acute on chronic systolic heart failure (HCC) 02/22/2022   Anemia    Asthma    Atrial fibrillation (HCC)    history of paroxysmal atrial fibrillation   CHF (congestive heart failure) (HCC) 11/02/2021   CHF exacerbation (HCC) 10/17/2021   Diabetes mellitus    DKA, type 2 (HCC) 02/06/2019   Elevated troponin 09/30/2019   Heart failure with reduced ejection fraction (HCC)    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  Consider chronic etiology given continued fertility issues   Hypertension    Hypertensive emergency 09/30/2019   Hypertensive urgency 06/02/2022   Major depressive disorder 02/08/2012   Reports that her infertility is a major cause of her depression Reports Prozac  has worked in the past for her.     Migraines    Morbid obesity (HCC) 02/06/2019   Nausea & vomiting  06/26/2022   Nonischemic cardiomyopathy (HCC) 09/22/2021   NSTEMI (non-ST elevated myocardial infarction) (HCC) 02/23/2022   Obstructive sleep apnea 06/30/2015   Polycystic ovarian disease    Past Surgical History:  Procedure Laterality Date   BIOPSY  11/05/2021   Procedure: BIOPSY;  Surgeon: Stacia Glendia BRAVO, MD;  Location: [facility] ENDOSCOPY;  Service: Gastroenterology;;   CERVIX LESION DESTRUCTION     CESAREAN SECTION     ESOPHAGOGASTRODUODENOSCOPY (EGD) WITH PROPOFOL  N/A 11/05/2021   Procedure: ESOPHAGOGASTRODUODENOSCOPY (EGD) WITH PROPOFOL ;  Surgeon: Stacia Glendia BRAVO, MD;  Location: [facility] ENDOSCOPY;  Service: Gastroenterology;  Laterality: N/A;   IR CT HEAD LTD  09/24/2022   IR PERCUTANEOUS ART THROMBECTOMY/INFUSION INTRACRANIAL INC DIAG ANGIO  09/24/2022   RADIOLOGY WITH ANESTHESIA N/A 09/24/2022   Procedure: IR WITH ANESTHESIA;  Surgeon: Radiologist, Medication, MD;  Location: [facility] OR;  Service: Radiology;  Laterality: N/A;   RIGHT/LEFT HEART CATH AND CORONARY ANGIOGRAPHY N/A 03/29/2019   Procedure: RIGHT/LEFT HEART CATH AND CORONARY ANGIOGRAPHY;  Surgeon: Rolan Ezra RAMAN, MD;  Location: [facility] INVASIVE CV LAB;  Service: Cardiovascular;  Laterality: N/A;   RIGHT/LEFT HEART CATH AND CORONARY ANGIOGRAPHY N/A 09/20/2021   Procedure: RIGHT/LEFT HEART CATH AND CORONARY ANGIOGRAPHY;  Surgeon: Rolan Ezra RAMAN, MD;  Location: [facility] INVASIVE CV LAB;  Service: Cardiovascular;  Laterality: N/A;   Patient  Active Problem List   Diagnosis Date Noted   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 (HCC) 04/12/2024   Urinary incontinence 02/09/2024   Weight loss, unintentional 04/11/2023   Major depressive disorder, recurrent episode, moderate (HCC) 04/05/2023   Left shoulder pain 11/28/2022   Gaze preference w/left inattention 10/04/2022   Left hemiplegia (HCC) 10/04/2022   Mild episode of recurrent major depressive disorder 10/04/2022   Insomnia 10/02/2022    Cerebrovascular accident (CVA) due to embolism of right middle cerebral artery (HCC) 09/27/2022   Anasarca 09/23/2022   Hyperbilirubinemia 06/25/2022   Chronic pain 05/09/2022   Nonischemic cardiomyopathy (HCC) 09/22/2021   Nonobstructive atherosclerosis of coronary artery 09/22/2021   LV (left ventricular) mural thrombus 09/22/2021   Chronic chest wall pain 03/27/2019   Biventricular failure (HCC) 02/06/2019   Chronic combined systolic and diastolic heart failure (HCC) 12/31/2018   Paroxysmal atrial fibrillation (HCC) 12/31/2018   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/31/2018   Chronic nausea 12/27/2018   Hyperlipidemia associated with type 2 diabetes mellitus (HCC) 10/30/2017   Obstructive sleep apnea 06/30/2015   Anemia of chronic disease 07/22/2013   Allergic rhinitis 06/27/2012   Hypertension associated with diabetes (HCC) 12/04/2011   Poorly controlled type 2 diabetes mellitus (HCC) 05/19/2011   PCOS (polycystic ovarian syndrome) 05/19/2011    PCP: Elyce Prescott, DO   REFERRING PROVIDER: Sven Elks, MD  THERAPY DIAG:  Abnormal posture - Plan: PT plan of care cert/re-cert  Difficulty in walking, not elsewhere classified - Plan: PT plan of care cert/re-cert  Muscle weakness (generalized) - Plan: PT plan of care cert/re-cert  Unsteadiness on feet - Plan: PT plan of care cert/re-cert  Rationale for Evaluation and Treatment Rehabilitation  SUBJECTIVE:                                                                                                                                                                                           SUBJECTIVE STATEMENT: Pt presents for wheelchair evaluation. She received a K5 wheelchair July 2024 and has been using it full time since. Her L UE remains profoundly spastic. Given that she is a full time wheelchair user, she is unable to efficiently use a hemi-technique for all required mobility throughout her day.    PRECAUTIONS: Fall  WEIGHT BEARING RESTRICTIONS No   PLOF:  Independent  PATIENT GOALS: to get a power assist          MEDICAL HISTORY:  Primary diagnosis onset: March 2024     Medical Diagnosis with ICD-10 code: I63.9 (ICD-10-CM) - Cerebrovascular accident (CVA), unspecified mechanism (HCC)    []   Progressive disease  Relevant future surgeries:     Height: 4'10 Weight: 146lb Explain recent changes or trends in weight:      History:  Past Medical History:  Diagnosis Date   Abnormal Pap smear    Acute combined systolic and diastolic heart failure (HCC) 02/19/2019   Wt Readings from Last 3 Encounters:  09/18/21  84.7 kg  09/02/21  84.4 kg  08/31/21  84.2 kg         Acute heart failure (HCC) 01/29/2022   Acute on chronic combined systolic and diastolic CHF (congestive heart failure) (HCC) 02/19/2019   Acute on chronic congestive heart failure (HCC)    Acute on chronic congestive heart failure (HCC)    Acute on chronic diastolic CHF (congestive heart failure) (HCC) 10/19/2021   Acute on chronic systolic heart failure (HCC) 02/22/2022   Anemia    Asthma    Atrial fibrillation (HCC)    history of paroxysmal atrial fibrillation   CHF (congestive heart failure) (HCC) 11/02/2021   CHF exacerbation (HCC) 10/17/2021   Diabetes mellitus    DKA, type 2 (HCC) 02/06/2019   Elevated troponin 09/30/2019   Heart failure with reduced ejection fraction (HCC)    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  Consider chronic etiology given continued fertility issues   Hypertension    Hypertensive emergency 09/30/2019   Hypertensive urgency 06/02/2022   Major depressive disorder 02/08/2012   Reports that her infertility is a major cause of her depression Reports Prozac  has worked in the past for her.     Migraines    Morbid obesity (HCC) 02/06/2019   Nausea & vomiting 06/26/2022   Nonischemic cardiomyopathy (HCC)  09/22/2021   NSTEMI (non-ST elevated myocardial infarction) (HCC) 02/23/2022   Obstructive sleep apnea 06/30/2015   Polycystic ovarian disease        Cardio Status:  Functional Limitations:   [x] Intact  []  Impaired      Respiratory Status:  Functional Limitations:   [x] Intact  [] Impaired   [] SOB [] COPD [] O2 Dependent ______LPM  [] Ventilator Dependent  Resp equip:                                                     Objective Measure(s):   Orthotics:   [] Amputee:                                                             [] Prosthesis:      HOME ENVIRONMENT:  [] House [] Condo/town home [x] Apartment [] Asst living [] LTCF         [] Own  [] Rent   [x] Lives alone [] Lives with others -                             Hours without assistance: has a CNA 2hours 45 mins 7 days/week   [x] Home is accessible to patient  Storage of wheelchair:  [x] In home   [] Other Comments:        COMMUNITY :  TRANSPORTATION:  [x] Car [] Interior And Spatial Designer [] Adapted w/c Lift []  Ambulance [] Other:                     [] Sits in wheelchair during transport   Where is w/c stored during transport?  [] [place]  []  [employer]  r   [] Self-Driver       Drive while in  Biomedical Scientist [] yes [x] no   Employment and/or school:  Specific requirements pertaining to mobility        Other:  COMMUNICATION:  Verbal Communication  [x] WFL [] receptive [x] WFL [] expressive [] Understandable  [] Difficult to understand  [] non-communicative  Primary Language:_____english_________ 2nd:_____________  Communication provided by:[x] Patient [x] Family [] Caregiver [] Translator   [] Uses an augmentative communication device     Manufacturer/Model :    MOBILITY/BALANCE:  Sitting Balance  Standing Balance  Transfers  Ambulation   [] WFL      [] WFL  [] Independent  []  Independent   [x] Uses UE for balance in sitting Comments:  [] Uses UE/device for stability Comments:  [x]  Min assist  []  Ambulates independently with        device:___________________      []  Mod assist  []  Able to ambulate ______ feet        safely/functionally/independently   []  Min assist  []  Min assist  []  Max assist  [x]  Non-functional ambulator         History/High risk of falls   []  Mod assist  []  Mod assist  []  Dependent  []  Unable to ambulate   []  Max  assist  []  Max assist  Transfer method:[x] 1 person [] 2 person [] sliding board [] squat pivot [x] stand pivot [] mechanical patient lift  [] other:   []  Unable  [x]  Unable    Fall History: # of falls in the past 6 months? 1 # of "near" falls in the past 6 months? 0    CURRENT SEATING / MOBILITY:  Current Mobility Device: [] None [] Cane/Walker [x] Manual [] Dependent [] Dependent w/ Tilt rScooter  [] Power (type of control):   Manufacturer:  Model:  Serial #:   Size:  Color:  Age: ~14months  Purchased by whom:   Current condition of mobility base:    Current seating system:                                                                       Age of seating system:   Describe posture in present seating system:    Is the current mobility meeting medical necessity?:  [] Yes [x] No Describe: Very limited endurance and onset of pain using hemi technique to navigate within her home     Ability to complete Mobility-Related Activities of Daily Living (MRADL's) with Current Mobility Device:   Move room to room  [] Independent  [x] Min [] Mod [] Max assist  [] Unable  Comments:   Meal prep  [] Independent  [x] Min [] Mod [] Max assist  [] Unable    Feeding  [] Independent  [x] Min [] Mod [] Max assist  [] Unable    Bathing  [] Independent  [x] Min [] Mod [] Max assist  [] Unable    Grooming  [] Independent  [x] Min [] Mod [] Max assist  [] Unable    UE dressing  [] Independent  [x] Min []   Mod [] Max assist  [] Unable    LE dressing  [] Independent   [x] Min [] Mod [] Max assist  [] Unable    Toileting  [] Independent  [x] Min [] Mod [] Max assist  [] Unable    Bowel Mgt: [x]  Continent []  Incontinent []  Accidents [x]  Diapers []  Colostomy []   Bowel Program:  Bladder Mgt: [x]  Continent []  Incontinent []  Accidents [x]  Diapers []  Urinal []  Intermittent Cath []  Indwelling Cath []  Supra-pubic Cath     Current Mobility Equipment Trialed/ Ruled Out:    Does not meet mobility needs due to:    Mark all boxes that indicate inability to use the specific equipment listed     Meets needs for safe  independent functional  ambulation  / mobility    Risk of  Falling or History of Falls    Enviromental limitations      Cognition    Safety concerns with  physical ability    Decreased / limitations endurance  & strength     Decreased / limitations  motor skills  & coordination    Pain    Pace /  Speed    Cardiac and/or  respiratory condition    Contra - indicated by diagnosis   Cane/Crutches  []   []   []   []   []   []   []   []   []   []   []    Walker / Rollator  []  NA   []   []   []   []   []   []   []   []   []   []   []     Manual Wheelchair [id number]:  []  NA  []   []   []   []   []   []   []   []   []   []   []    Manual W/C (K0005) with power assist  []  NA  [x]   []   []   []   []   []   []   []   []   []   []    Scooter  []  NA  []   []   []   []   []   []   []   []   []   []   []    Power Wheelchair: standard joystick  []  NA  []   []   []   []   []   []   []   []   []   []   []    Power Wheelchair: alternative controls  []  NA  []   []   []   []   []   []   []   []   []   []   []    Summary:  The least costly alternative for independent functional mobility was found to be:    []  Crutch/Cane  []  Walker []  Manual w/c  [x]  Manual w/c with power assist   []  Scooter   []  Power w/c std joystick   []  Power w/c alternative control        []  Requires dependent care mobility device   Cabin Crew for [employer] skills are adequate for safe mobility equipment operation  [x]   Yes []   No  Patient is willing and motivated to use recommended mobility equipment  [x]   Yes []   No       []  Patient is unable to safely operate mobility equipment independently and requires  dependent care equipment Comments:           SENSATION and SKIN ISSUES:  Sensation []  Intact  [x]  Impaired []  Absent []  Hyposensate []  Hypersensate  []  Defensiveness  Location(s) of impairment:  L hemibody   Pressure Relief Method(s):  [x]  Lean side to side to offload (without risk of  falling)  []   W/C push up (4+ times/hour for 15+ seconds) []  Stand up (without risk of falling)    []  Other: (Describe): Effective pressure relief method(s) above can be performed consistently throughout the day: [x] Yes  []  No If not, Why?:  Skin Integrity Risk:       [x]  Low risk           []  Moderate risk            []  High risk  If high risk, explain:   Skin Issues/Skin Integrity  Current skin Issues  []  Yes [x]  No []  Intact  []   Red area   []   Open area  []  Scar tissue  []  At risk from prolonged sitting  Where: History of Skin Issues  []  Yes [x]  No Where : When: Stage: Hx of skin flap surgeries  []  Yes [x]  No Where:  When:  Pain: [x]  Yes []  No   Pain Location(s): L hemibody, low back Intensity scale: (0-10) : 7+/10 How does pain interfere with mobility and/or MRADLs? - limited in tolerance to propel currently K5 wheelchair around her home to complete MRADLs   MAT EVALUATION:  Neuro-Muscular Status: (Tone, Reflexive, Responses, etc.)     []   Intact   [x]  Spasticity:  []  Hypotonicity  []  Fluctuating  []  Muscle Spasms  [x]  Poor Righting Reactions/Poor Equilibrium Reactions  []  Primal Reflex(s):    Comments:            COMMENTS:    POSTURE:     Comments:  Pelvis Anterior/Posterior:  []  Neutral   [x]  Posterior  []  Anterior  []  Fixed - No movement [x]  Tendency away from neutral []  Flexible [x]  Self-correction []  External correction Obliquity (viewed from front)  [x]  WFL []  R Obliquity []  L Obliquity  []  Fixed - No movement []  Tendency away from neutral []  Flexible []  Self-correction []  External correction Rotation  [x]  WFL []  R anterior []  L anterior  []   Fixed - No movement []  Tendency away from neutral []  Flexible []  Self-correction []  External correction Tonal Influence Pelvis:  [x]  Normal []  Flaccid []  Low tone []  Spasticity []  Dystonia []  Pelvis thrust []  Other:    Trunk Anterior/Posterior:  []  WFL [x]  Thoracic kyphosis []  Lumbar lordosis  []  Fixed - No movement []  Tendency away from neutral [x]  Flexible []  Self-correction [x]  External correction  [x]  WFL []  Convex to left  []  Convex to right []  S-curve   []  C-curve []  Multiple curves []  Tendency away from neutral []  Flexible []  Self-correction []  External correction Rotation of shoulders and upper trunk:  [x]  Neutral []  Left-anterior []  Right- anterior []  Fixed- no movement []  Tendency away from neutral []  Flexible []  Self correction []  External correction Tonal influence Trunk:  [x]  Normal []  Flaccid []  Low tone []  Spasticity []  Dystonia []  Other:   Head & Neck  [x]  Functional [x]  Flexed    []  Extended []  Rotated right  []  Rotated left []  Laterally flexed right []  Laterally flexed left []  Cervical hyperextension   [x]  Good head control []  Adequate head control []  Limited head control []  Absent head control Describe tone/movement of head and neck:    Lower Extremity Measurements: LE MMT:  MMT Right 05/31/2024 Left 05/31/2024  Hip flexion 4 2  Hip extension    Hip abduction 4 2  Hip adduction 4 3  Knee flexion 4 2  Knee extension 4 2  Ankle dorsiflexion 3 2  Ankle plantarflexion     (  Blank rows = not tested)  Hip positions:  [x]  Neutral   []  Abducted   []  Adducted  []  Subluxed   []  Dislocated   []  Fixed   []  Tendency away from neutral []  Flexible []  Self-correction []  External correction   Hip Windswept:[x]  Neutral  []  Right    []  Left  []  Subluxed   []  Dislocated   []  Fixed   []  Tendency away from neutral []  Flexible []  Self-correction []  External correction  LE Tone: []  Normal []  Low tone [x]  Spasticity []   Flaccid [x]  Dystonia []  Rocks/Extends at hip []  Thrust into knee extension []  Pushes legs downward into footrest  UE Measurements:  UPPER EXTREMITY MMT:  MMT Right 05/31/2024 Left 05/31/2024  Shoulder flexion 3 1  Shoulder abduction 3+ 2  Shoulder adduction    Elbow flexion 4 spastic  Elbow extension 4 spastic  Wrist flexion  spastic  Wrist extension  spastic  Pinch strength    Grip strength ~25lbs Non functional  (Blank rows = not tested)  Shoulder Posture:  Right Tendency towards Left  []   Functional []    []   Elevation []    [x]   Depression [x]    [x]   Protraction [x]    []   Retraction []    [x]   Internal rotation [x]    []   External rotation []    []   Subluxed []     UE Tone: []  Normal []  Flaccid []  Low tone [x]  Spasticity  [x]  Dystonia []  Other:   Wrist/Hand: Handedness: [x]  Right   []  Left   []  NA: Comments:  Right  Left  [x]   WNL []    []   Limitations [x]    []   Contractures []    []   Fisting []    []   Tremors []    []   Weak grasp [x]    []   Poor dexterity [x]    []   Hand movement non functional [x]    []   Paralysis []         MOBILITY BASE RECOMMENDATIONS and JUSTIFICATION:    [x]  Power assist Comments: Smart Drive with [employer]  [x]  prevent repetitive use injuries  []  repetitive strain injury present in    shoulder girdle    [x]  shoulder pain is (> or =) to 7/10     during manual propulsion       Current Pain ___7__/10  [x]  requires conservation of energy to participate in MRADL(s) runable to propel up ramps or curbs using manual wheelchair  [x]  been K0005 user greater than one year  [x]  user unwilling to use power      wheelchair (reason): in accessible environment  [x]  less expensive option to power   wheelchair   []  rim activated power assist -      decreased strength                                                                                                             The above equipment has a life- long use  expectancy.  Growth and changes in medical and/or functional conditions would be  the exceptions.   SUMMARY:  Why mobility device was selected; include why a lower level device is not appropriate:    ASSESSMENT:  CLINICAL IMPRESSION: Patient is a 48 y.o. female who was seen today for physical therapy evaluation for a PowerAssist SmartDrive with a buddy button. She has been a full time K5 ultra lightweight wheelchair for over a year. Due to the extent of her L UE spasticity, she is unable to propel her manual wheelchair in a traditional method- necessitating a hemi-technique. This method is very energy inefficient and, unfortunately, leaves the patient with little to no energy or strength to safely complete/participate in her MRADLs. She experiences significant pain in her L hemibody, but also her low back related to the energy and strength necessary to maneuver independently within her home. While she does have a home health aid assist her for a few hours each day, she is largely without assist and so must conserve as much energy as possible. A SmartDrive power assist with a Dewitt Mask would allow the patient to maintain her current level of independence without further increasing her levels of pain and enable her to more efficiently and safely participate in her her MRADLs.    OBJECTIVE IMPAIRMENTS Abnormal gait, cardiopulmonary status limiting activity, decreased activity tolerance, decreased endurance, decreased mobility, difficulty walking, decreased ROM, decreased strength, impaired sensation, impaired tone, impaired UE functional use, postural dysfunction, and pain.   ACTIVITY LIMITATIONS carrying, lifting, standing, squatting, stairs, transfers, bathing, dressing, hygiene/grooming, locomotion level, and caring for others  PARTICIPATION LIMITATIONS: meal prep, cleaning, laundry, interpersonal relationship, driving, shopping, community activity, and occupation  PERSONAL FACTORS Age, Behavior  pattern, Past/current experiences, Social background, Time since onset of injury/illness/exacerbation, and Transportation are also affecting patient's functional outcome.   REHAB POTENTIAL: Fair time since onset  CLINICAL DECISION MAKING: Stable/uncomplicated  EVALUATION COMPLEXITY: High     GOALS: One time visit. No goals established.  PLAN: PT FREQUENCY: one time visit  Delon DELENA Pop, PT Delon DELENA Pop, PT, DPT, CBIS  05/31/2024, 3:47 PM    I concur with the above findings and recommendations of the therapist:  Physician name printed:         Physician's signature:      Date:

## 2024-06-03 ENCOUNTER — Telehealth: Payer: Self-pay | Admitting: Physical Medicine and Rehabilitation

## 2024-06-03 NOTE — Telephone Encounter (Signed)
 We received a release from [employer] to get office notes for patient, power assist feature for her existing wheelchair.  I didn't see it in your note.  Did you order this for her?  If so, this will need to be discussed in a note within last 6 months.

## 2024-06-04 NOTE — Addendum Note (Signed)
 Addended by: LORILEE SVEN SQUIBB on: 06/04/2024 01:29 PM   Modules accepted: Level of Service

## 2024-06-07 ENCOUNTER — Other Ambulatory Visit (HOSPITAL_COMMUNITY): Payer: Self-pay

## 2024-06-13 ENCOUNTER — Ambulatory Visit (INDEPENDENT_AMBULATORY_CARE_PROVIDER_SITE_OTHER)

## 2024-06-13 VITALS — Ht <= 58 in

## 2024-06-13 DIAGNOSIS — Z0000 Encounter for general adult medical examination without abnormal findings: Secondary | ICD-10-CM

## 2024-06-13 NOTE — Progress Notes (Signed)
 I connected with  [name] on 06/13/24 by a audio enabled telemedicine application and verified that I am speaking with the correct person using two identifiers.  Patient Location: Home  Provider Location: Office/Clinic  Persons Participating in Visit: Patient.  I discussed the limitations of evaluation and management by telemedicine. The patient expressed understanding and agreed to proceed.   Vital Signs: Because this visit was a virtual/telehealth visit, some criteria may be missing or patient reported. Any vitals not documented were not able to be obtained and vitals that have been documented are patient reported.     Chief Complaint  Patient presents with   Medicare Wellness    SUBSEQUENT     Subjective:   [name] is a 48 y.o. female who presents for a Medicare Annual Wellness Visit.  Allergies (verified) Fish allergy, Shellfish allergy, Metformin , Metformin  and related, Nsaids, and Trulicity  [dulaglutide ]   History: Past Medical History:  Diagnosis Date   Abnormal Pap smear    Acute combined systolic and diastolic heart failure (HCC) 02/19/2019   Wt Readings from Last 3 Encounters:  09/18/21  84.7 kg  09/02/21  84.4 kg  08/31/21  84.2 kg         Acute heart failure (HCC) 01/29/2022   Acute on chronic combined systolic and diastolic CHF (congestive heart failure) (HCC) 02/19/2019   Acute on chronic congestive heart failure (HCC)    Acute on chronic congestive heart failure (HCC)    Acute on chronic diastolic CHF (congestive heart failure) (HCC) 10/19/2021   Acute on chronic systolic heart failure (HCC) 02/22/2022   Anemia    Asthma    Atrial fibrillation (HCC)    history of paroxysmal atrial fibrillation   CHF (congestive heart failure) (HCC) 11/02/2021   CHF exacerbation (HCC) 10/17/2021   Diabetes mellitus    DKA, type 2 (HCC) 02/06/2019   Elevated troponin 09/30/2019   Heart failure with reduced ejection fraction (HCC)    History of Left  ventricular thrombus 09/22/2021   Hydrosalpinx 08/21/2009   Dx on Pelvic US  in Jan 2011.  Consider chronic etiology given continued fertility issues   Hypertension    Hypertensive emergency 09/30/2019   Hypertensive urgency 06/02/2022   Major depressive disorder 02/08/2012   Reports that her infertility is a major cause of her depression Reports Prozac  has worked in the past for her.     Migraines    Morbid obesity (HCC) 02/06/2019   Nausea & vomiting 06/26/2022   Nonischemic cardiomyopathy (HCC) 09/22/2021   NSTEMI (non-ST elevated myocardial infarction) (HCC) 02/23/2022   Obstructive sleep apnea 06/30/2015   Polycystic ovarian disease    Past Surgical History:  Procedure Laterality Date   BIOPSY  11/05/2021   Procedure: BIOPSY;  Surgeon: Stacia Glendia BRAVO, MD;  Location: [facility] ENDOSCOPY;  Service: Gastroenterology;;   CERVIX LESION DESTRUCTION     CESAREAN SECTION     ESOPHAGOGASTRODUODENOSCOPY (EGD) WITH PROPOFOL  N/A 11/05/2021   Procedure: ESOPHAGOGASTRODUODENOSCOPY (EGD) WITH PROPOFOL ;  Surgeon: Stacia Glendia BRAVO, MD;  Location: [facility] ENDOSCOPY;  Service: Gastroenterology;  Laterality: N/A;   IR CT HEAD LTD  09/24/2022   IR PERCUTANEOUS ART THROMBECTOMY/INFUSION INTRACRANIAL INC DIAG ANGIO  09/24/2022   RADIOLOGY WITH ANESTHESIA N/A 09/24/2022   Procedure: IR WITH ANESTHESIA;  Surgeon: Radiologist, Medication, MD;  Location: [facility] OR;  Service: Radiology;  Laterality: N/A;   RIGHT/LEFT HEART CATH AND CORONARY ANGIOGRAPHY N/A 03/29/2019   Procedure: RIGHT/LEFT HEART CATH AND CORONARY ANGIOGRAPHY;  Surgeon: Rolan Barrack  S, MD;  Location: [facility] INVASIVE CV LAB;  Service: Cardiovascular;  Laterality: N/A;   RIGHT/LEFT HEART CATH AND CORONARY ANGIOGRAPHY N/A 09/20/2021   Procedure: RIGHT/LEFT HEART CATH AND CORONARY ANGIOGRAPHY;  Surgeon: Rolan Ezra RAMAN, MD;  [facility] INVASIVE CV LAB;  Service: Cardiovascular;  Laterality: N/A;   Family History  Problem Relation Age of Onset   Diabetes Mother     Hypertension Mother    Hypertension Father    Diabetes Father    Social History   Occupational History   Not on file  Tobacco Use   Smoking status: Never   Smokeless tobacco: Never   Tobacco comments:    Never smoke 10/22/21  Vaping Use   Vaping status: Never Used  Substance and Sexual Activity   Alcohol use: No    Alcohol/week: 0.0 standard drinks of alcohol   Drug use: No   Sexual activity: Not Currently    Birth control/protection: None   Tobacco Counseling Counseling given: Not Answered Tobacco comments: Never smoke 10/22/21  SDOH Screenings   Food Insecurity: No Food Insecurity (06/13/2024)  Housing: Low Risk  (06/13/2024)  Transportation Needs: No Transportation Needs (06/13/2024)  Utilities: Not At Risk (06/13/2024)  Alcohol Screen: Low Risk  (06/13/2024)  Depression (PHQ2-9): Medium Risk (06/13/2024)  Financial Resource Strain: Low Risk (04/24/2023)   Received from [facility]  Physical Activity: Inactive (06/13/2024)  Social Connections: Socially Isolated (06/13/2024)  Stress: Stress Concern Present (06/13/2024)  Tobacco Use: Low Risk  (06/13/2024)  Health Literacy: Adequate Health Literacy (06/13/2024)   See flowsheets for full screening details  Depression Screen Depression Screening Exception Documentation Depression Screening Exception:: Patient refusal  PHQ 2 & 9 Depression Scale- Over the past 2 weeks, how often have you been bothered by any of the following problems? Little interest or pleasure in doing things: 3 Feeling down, depressed, or hopeless (PHQ Adolescent also includes...irritable): 3 PHQ-2 Total Score: 6 Trouble falling or staying asleep, or sleeping too much: 1 Feeling tired or having little energy: 1 Poor appetite or overeating (PHQ Adolescent also includes...weight loss): 1 Feeling bad about yourself - or that you are a failure or have let yourself or your family down: 0 Trouble concentrating on things, such as reading the  newspaper or watching television (PHQ Adolescent also includes...like school work): 1 Moving or speaking so slowly that other people could have noticed. Or the opposite - being so fidgety or restless that you have been moving around a lot more than usual: 0 Thoughts that you would be better off dead, or of hurting yourself in some way: 0 PHQ-9 Total Score: 10 If you checked off any problems, how difficult have these problems made it for you to do your work, take care of things at home, or get along with other people?: Somewhat difficult  Depression Treatment Depression Interventions/Treatment : Community Resources Provided     Goals Addressed             This Visit's Progress    06/13/2024: My goal is to walk better so that I can get out of this wheelchair.         Visit info / Clinical Intake: Medicare Wellness Visit Type:: Subsequent Annual Wellness Visit Persons participating in visit:: patient Medicare Wellness Visit Mode:: Telephone If telephone:: video declined Because this visit was a virtual/telehealth visit:: pt reported vitals If Telephone or Video please confirm:: I connected with the patient using audio enabled telemedicine application and verified that I am speaking with the  correct person using two identifiers; The patient expressed understanding and agreed to proceed; I discussed the limitations of evaluation and management by telemedicine Patient Location:: HOME Provider Location:: OFFICE Information given by:: patient Interpreter Needed?: No Pre-visit prep was completed: yes AWV questionnaire completed by patient prior to visit?: no Living arrangements:: (!) lives alone (FEELS VERY SAFE IN HOME) Patient's Overall Health Status Rating: (!) fair Typical amount of pain: (!) a lot Does pain affect daily life?: (!) yes Are you currently prescribed opioids?: (!) yes  Dietary Habits and Nutritional Risks How many meals a day?: (!) 1 Eats fruit and vegetables  daily?: (!) no (WILL START EATING FRUITS & VEGETABLES) Most meals are obtained by: eating out ([employer]; ISSUES WITH STOVE) In the last 2 weeks, have you had any of the following?: none Diabetic:: (!) yes Any non-healing wounds?: no How often do you check your BS?: 0 Would you like to be referred to a Nutritionist or for Diabetic Management? : no  Functional Status Activities of Daily Living (to include ambulation/medication): (!) Needs Assist Dressing/Grooming: Needs assistance Bathing: Needs assistance Toileting: Needs assistance Transfer: Needs assistance Ambulation: Independent with device- listed below Home Assistive Devices/Equipment: Eyeglasses; Wheelchair; Communication device (specify type); Hospital bed; Other (Comment) (INHALER) Medication Administration: Independent Home Management: Independent Manage your own finances?: yes Primary transportation is: family/friends Concerns about vision?: no *vision screening is required for WTM* Concerns about hearing?: no  Fall Screening Falls in the past year?: 0 Number of falls in past year: 0 Was there an injury with Fall?: 0 Fall Risk Category Calculator: 0 Patient Fall Risk Level: Low Fall Risk  Fall Risk Patient at Risk for Falls Due to: Impaired balance/gait; Impaired mobility Fall risk Follow up: Falls evaluation completed; Education provided  Home and Transportation Safety: All rugs have non-skid backing?: N/A, no rugs All stairs or steps have railings?: N/A, no stairs Grab bars in the bathtub or shower?: yes Have non-skid surface in bathtub or shower?: yes Good home lighting?: yes Regular seat belt use?: yes Hospital stays in the last year:: no  Cognitive Assessment Difficulty concentrating, remembering, or making decisions? : yes Will 6CIT or Mini Cog be Completed: no 6CIT or Mini Cog Declined: patient alert, oriented, able to answer questions appropriately and recall recent events  Advance Directives  (For Healthcare) Does Patient Have a Medical Advance Directive?: No Type of Advance Directive: Out of facility DNR (pink MOST or yellow form) Would patient like information on creating a medical advance directive?: No - Patient declined  Reviewed/Updated  Reviewed/Updated: Reviewed All (Medical, Surgical, Family, Medications, Allergies, Care Teams, Patient Goals)        Objective:    Today's Vitals   06/13/24 1536  Height: 4' 9 (1.448 m)  PainSc: 8   PainLoc: Generalized   Body mass index is 30.51 kg/m.  Current Medications (verified) Outpatient Encounter Medications as of 06/13/2024  Medication Sig   Accu-Chek Softclix Lancets lancets USE 1  TO CHECK GLUCOSE 4 TIMES DAILY   Accu-Chek Softclix Lancets lancets Use each in the morning, at noon, and at bedtime.   acetaminophen  (TYLENOL ) 500 MG tablet Take 1-2 tablets (500-1,000 mg total) by mouth every 6 (six) hours as needed for mild pain (pain score 1-3) or headache (OR CRAMPING).   albuterol  (VENTOLIN  HFA) 108 (90 Base) MCG/ACT inhaler Inhale 2 puffs into the lungs every 6 (six) hours as needed for wheezing or shortness of breath.   Atogepant  (QULIPTA ) 30 MG TABS Take 1 tablet (30  mg total) by mouth daily.   atorvastatin  (LIPITOR ) 80 MG tablet Take 1 tablet (80 mg total) by mouth daily after supper.   bisacodyl  5 MG EC tablet Take 2 tablets (10 mg total) by mouth every 24 hours as needed for constipation.   blood glucose meter kit and supplies Use up to 4 (four) times daily as directed   Blood Glucose Monitoring Suppl (ACCU-CHEK GUIDE) w/Device KIT Use up to 4 (four) times daily.   Cholecalciferol  (VITAMIN D ) 125 MCG (5000 UT) CAPS Take one capsule by mouth once daily.   dabigatran  (PRADAXA ) 150 MG CAPS capsule Take 1 capsule (150 mg total) by mouth 2 (two) times daily for anticoagulant.   empagliflozin  (JARDIANCE ) 25 MG TABS tablet Take 1 tablet (25 mg total) by mouth daily.   escitalopram  (LEXAPRO ) 20 MG tablet Take 1 tablet  (20 mg total) by mouth at bedtime.   gabapentin  (NEURONTIN ) 300 MG capsule Take 1 capsule (300 mg total) by mouth at bedtime.   glipiZIDE  (GLUCOTROL ) 5 MG tablet Take 1 tablet (5 mg total) by mouth daily before breakfast.   glucose blood (ACCU-CHEK GUIDE) test strip USE  4 TIMES DAILY   hydrOXYzine  (ATARAX ) 10 MG tablet Take 1 tablet (10 mg total) by mouth 3 (three) times daily as needed for anxiety.   levETIRAcetam  (KEPPRA ) 750 MG tablet Take 1 tablet (750 mg total) by mouth 2 (two) times daily for seizures.   lidocaine  (LIDODERM ) 5 % Place 1 patch onto the skin daily. Remove & Discard patch within 12 hours or as directed by MD   losartan  (COZAAR ) 50 MG tablet Take 1 tablet (50 mg total) by mouth daily.   meclizine  (ANTIVERT ) 25 MG tablet Take 1 tablet (25 mg total) by mouth 3 (three) times daily as needed for dizziness.   melatonin 5 MG TABS Take 1 tablet (5 mg total) by mouth at bedtime.   metoprolol  tartrate (LOPRESSOR ) 25 MG tablet Take 1 tablet (25 mg total) by mouth 2 (two) times daily.   oxyCODONE  (ROXICODONE ) 5 MG immediate release tablet Take 1 tablet (5 mg total) by mouth 3 (three) times daily as needed for severe pain (pain score 7-10).   pregabalin  (LYRICA ) 25 MG capsule Take 1 capsule (25 mg total) by mouth 3 (three) times daily.   spironolactone  (ALDACTONE ) 25 MG tablet Take 1 tablet (25 mg total) by mouth daily.   tiZANidine  (ZANAFLEX ) 4 MG tablet Take 1 tablet (4 mg total) by mouth every 6 (six) hours as needed for muscle spasms.   torsemide  (DEMADEX ) 20 MG tablet Take 1 tablet (20 mg total) by mouth daily.   traZODone  (DESYREL ) 150 MG tablet Take 1 tablet (150 mg total) by mouth at bedtime for insomnia.   Ubrogepant  (UBRELVY ) 100 MG TABS Take 1 tablet (100 mg total) by mouth daily as needed.   [DISCONTINUED] carvedilol  (COREG ) 3.125 MG tablet Take 1 tablet (3.125 mg total) by mouth 2 (two) times daily with a meal.   No facility-administered encounter medications on file as of  06/13/2024.   Hearing/Vision screen Hearing Screening - Comments:: Denies hearing difficulties.  Vision Screening - Comments:: Wears rx glasses - not up to date with routine eye exams.  Immunizations and Health Maintenance Health Maintenance  Topic Date Due   COVID-19 Vaccine (1) Never done   OPHTHALMOLOGY EXAM  Never done   Hepatitis C Screening  Never done   DTaP/Tdap/Td (1 - Tdap) Never done   Hepatitis B Vaccines 19-59 Average Risk (1 of  3 - 19+ 3-dose series) Never done   Mammogram  Never done   FOOT EXAM  08/21/2020   Colonoscopy  Never done   HEMOGLOBIN A1C  02/25/2024   Diabetic kidney evaluation - Urine ACR  08/27/2024   Diabetic kidney evaluation - eGFR measurement  05/26/2025   Medicare Annual Wellness (AWV)  06/13/2025   Cervical Cancer Screening (HPV/Pap Cotest)  01/10/2029   HIV Screening  Completed   HPV VACCINES  Aged Out   Meningococcal B Vaccine  Aged Out   Pneumococcal Vaccine  Discontinued   Influenza Vaccine  Discontinued        Assessment/Plan:  This is a routine wellness examination for Haizel.  Patient Care Team: Stoney Blizzard, DO as PCP - General (Family Medicine) Inocencio Soyla Lunger, MD as PCP - Electrophysiology (Cardiology) Alvan Ronal BRAVO, MD (Inactive) as PCP - Cardiology (Cardiology) Uris, Jenna H, LCSW as Social Worker (Licensed Clinical Social Worker) Ezzard Rolin BIRCH, LCSW as VBCI Care Management (Licensed Clinical Social Worker) Ezzard Rolin BIRCH, LCSW (Licensed Visual Merchandiser)  I have personally reviewed and noted the following in the patient's chart:   Medical and social history Use of alcohol, tobacco or illicit drugs  Current medications and supplements including opioid prescriptions. Functional ability and status Nutritional status Physical activity Advanced directives List of other physicians Hospitalizations, surgeries, and ER visits in previous 12 months Vitals Screenings to include cognitive, depression, and  falls Referrals and appointments  No orders of the defined types were placed in this encounter.  In addition, I have reviewed and discussed with patient certain preventive protocols, quality metrics, and best practice recommendations. A written personalized care plan for preventive services as well as general preventive health recommendations were provided to patient.   Roz LOISE Fuller, LPN   88/79/7974   Return in 1 year (on 06/13/2025).  After Visit Summary: (MyChart) Due to this being a telephonic visit, the after visit summary with patients personalized plan was offered to patient via MyChart   Nurse Notes: Patient aware of current health care gaps.

## 2024-06-13 NOTE — Patient Instructions (Signed)
 [name],  Thank you for taking the time for your Medicare Wellness Visit. I appreciate your continued commitment to your health goals. Please review the care plan we discussed, and feel free to reach out if I can assist you further.  Please note that Annual Wellness Visits do not include a physical exam. Some assessments may be limited, especially if the visit was conducted virtually. If needed, we may recommend an in-person follow-up with your provider.  Ongoing Care Seeing your primary care provider every 3 to 6 months helps us  monitor your health and provide consistent, personalized care.   Referrals If a referral was made during today's visit and you haven't received any updates within two weeks, please contact the referred provider directly to check on the status.  Recommended Screenings:  Health Maintenance  Topic Date Due   COVID-19 Vaccine (1) Never done   Eye exam for diabetics  Never done   Hepatitis C Screening  Never done   DTaP/Tdap/Td vaccine (1 - Tdap) Never done   Hepatitis B Vaccine (1 of 3 - 19+ 3-dose series) Never done   Breast Cancer Screening  Never done   Complete foot exam   08/21/2020   Colon Cancer Screening  Never done   Hemoglobin A1C  02/25/2024   Medicare Annual Wellness Visit  04/03/2024   Yearly kidney health urinalysis for diabetes  08/27/2024   Yearly kidney function blood test for diabetes  05/26/2025   Pap with HPV screening  01/10/2029   HIV Screening  Completed   HPV Vaccine  Aged Out   Meningitis B Vaccine  Aged Out   Pneumococcal Vaccine  Discontinued   Flu Shot  Discontinued       06/13/2024    3:38 PM  Advanced Directives  Does Patient Have a Medical Advance Directive? No  Would patient like information on creating a medical advance directive? No - Patient declined    Vision: Annual vision screenings are recommended for early detection of glaucoma, cataracts, and diabetic retinopathy. These exams can also reveal signs of chronic  conditions such as diabetes and high blood pressure.  Dental: Annual dental screenings help detect early signs of oral cancer, gum disease, and other conditions linked to overall health, including heart disease and diabetes.  Please see the attached documents for additional preventive care recommendations.

## 2024-06-17 ENCOUNTER — Other Ambulatory Visit: Payer: Self-pay | Admitting: *Deleted

## 2024-06-17 ENCOUNTER — Other Ambulatory Visit (HOSPITAL_COMMUNITY): Payer: Self-pay

## 2024-06-17 ENCOUNTER — Other Ambulatory Visit: Payer: Self-pay | Admitting: Physical Medicine and Rehabilitation

## 2024-06-17 ENCOUNTER — Other Ambulatory Visit: Payer: Self-pay | Admitting: Licensed Clinical Social Worker

## 2024-06-17 ENCOUNTER — Ambulatory Visit

## 2024-06-17 VITALS — BP 149/91 | HR 67

## 2024-06-17 DIAGNOSIS — F419 Anxiety disorder, unspecified: Secondary | ICD-10-CM | POA: Diagnosis not present

## 2024-06-17 DIAGNOSIS — E1159 Type 2 diabetes mellitus with other circulatory complications: Secondary | ICD-10-CM

## 2024-06-17 DIAGNOSIS — F5101 Primary insomnia: Secondary | ICD-10-CM

## 2024-06-17 DIAGNOSIS — M79605 Pain in left leg: Secondary | ICD-10-CM | POA: Diagnosis not present

## 2024-06-17 DIAGNOSIS — G43009 Migraine without aura, not intractable, without status migrainosus: Secondary | ICD-10-CM | POA: Diagnosis not present

## 2024-06-17 DIAGNOSIS — I152 Hypertension secondary to endocrine disorders: Secondary | ICD-10-CM | POA: Diagnosis not present

## 2024-06-17 MED ORDER — HYDROXYZINE HCL 10 MG PO TABS
10.0000 mg | ORAL_TABLET | Freq: Three times a day (TID) | ORAL | 0 refills | Status: AC | PRN
  Filled 2024-06-17: qty 90, 30d supply, fill #0

## 2024-06-17 MED ORDER — LOSARTAN POTASSIUM 100 MG PO TABS
100.0000 mg | ORAL_TABLET | Freq: Every day | ORAL | 0 refills | Status: DC
  Filled 2024-06-17: qty 30, 30d supply, fill #0

## 2024-06-17 MED ORDER — TIZANIDINE HCL 4 MG PO TABS
4.0000 mg | ORAL_TABLET | Freq: Four times a day (QID) | ORAL | 0 refills | Status: DC | PRN
  Filled 2024-06-17: qty 30, 8d supply, fill #0

## 2024-06-17 NOTE — Assessment & Plan Note (Signed)
Refilled hydroxyzine.

## 2024-06-17 NOTE — Patient Instructions (Addendum)
 Hello, today we talked about your blood pressure. It was 173/95 today. The repeat number was 149/91. Your blood pressure medicine called Losartan  had some room to increase. So, we increased Losartan  to 100 mg.   Please come back in 2 weeks to our clinic so we can check on your blood pressure again.   Also, it looks like it should be time for you to see the Cardiologist again. You can call them to make an appointment.   I also refilled your hydroxyzine  (atarax ) and your tizanidine  (zanafelx). The other medications you mentioned still had refills left.

## 2024-06-17 NOTE — Progress Notes (Signed)
    SUBJECTIVE:   CHIEF COMPLAINT / HPI: BP  [name] presents today to discuss her blood pressure. She has home health come to her house once a week. They have told her that her blood pressure has been running high. She does not know the numbers, but that is why she made this appointment. She states that she always has a headache, but that it is no different than her usual one. She denies any blurry vision or other visual disturbances. She denies any other symptoms.   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 UE left strength 0/5 LE strength 4/5 on right, 3/5 on left  Normal sensation in UE and LE bilaterally  (Findings consistent with effects of prior CVA)   ASSESSMENT/PLAN:   Assessment & Plan Hypertension associated with diabetes (HCC) 173/95. Repeat 149/91. Patient states home health nurses have reported elevated pressures as well.  - Increased Losartan  to 100 mg daily today - Follow-up in 2 weeks Primary insomnia Anxiety Refilled hydroxyzine .  Left leg pain Spastic hemiplegiaof left nondominant side as late effect of CVA.  - Refilled tizanidine       Raguel KANDICE Lee, DO [facility]

## 2024-06-17 NOTE — Patient Instructions (Signed)
 [name] - I am you where unable to complete outreach today.   I was unable to reach you today for our scheduled appointment. I work with Sports Coach, Kirstie, DO and am calling to support you healthcare needs. Please contact me at [phone] at your earliest convenience. I look forward to speaking with you soon.   Thank you,  Tammy Hegwood, RN, BSN, ACM RN Care Manager [employer] [phone]

## 2024-06-17 NOTE — Assessment & Plan Note (Signed)
 173/95. Repeat 149/91. Patient states home health nurses have reported elevated pressures as well.  - Increased Losartan  to 100 mg daily today - Follow-up in 2 weeks

## 2024-06-18 ENCOUNTER — Other Ambulatory Visit (HOSPITAL_COMMUNITY): Payer: Self-pay

## 2024-06-18 MED ORDER — OXYCODONE HCL 5 MG PO TABS
5.0000 mg | ORAL_TABLET | Freq: Three times a day (TID) | ORAL | 0 refills | Status: AC | PRN
  Filled 2024-06-18: qty 60, 20d supply, fill #0

## 2024-06-19 ENCOUNTER — Telehealth: Payer: Self-pay

## 2024-06-19 NOTE — Patient Outreach (Signed)
 Complex Care Management   Visit Note  06/17/2024  Name:  [name] MRN: [id number] DOB: 07-May-1976  Situation: Referral received for Complex Care Management related to Stress/Chronic Pain I obtained verbal consent from Patient.  Visit completed with Patient  on the phone  Background:   Past Medical History:  Diagnosis Date   Abnormal Pap smear    Acute combined systolic and diastolic heart failure (HCC) 02/19/2019   Wt Readings from Last 3 Encounters:  09/18/21  84.7 kg  09/02/21  84.4 kg  08/31/21  84.2 kg         Acute heart failure (HCC) 01/29/2022   Acute on chronic combined systolic and diastolic CHF (congestive heart failure) (HCC) 02/19/2019   Acute on chronic congestive heart failure (HCC)    Acute on chronic congestive heart failure (HCC)    Acute on chronic diastolic CHF (congestive heart failure) (HCC) 10/19/2021   Acute on chronic systolic heart failure (HCC) 02/22/2022   Anemia    Asthma    Atrial fibrillation (HCC)    history of paroxysmal atrial fibrillation   CHF (congestive heart failure) (HCC) 11/02/2021   CHF exacerbation (HCC) 10/17/2021   Diabetes mellitus    DKA, type 2 (HCC) 02/06/2019   Elevated troponin 09/30/2019   Heart failure with reduced ejection fraction (HCC)    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  Consider chronic etiology given continued fertility issues   Hypertension    Hypertensive emergency 09/30/2019   Hypertensive urgency 06/02/2022   Major depressive disorder 02/08/2012   Reports that her infertility is a major cause of her depression Reports Prozac  has worked in the past for her.     Migraines    Morbid obesity (HCC) 02/06/2019   Nausea & vomiting 06/26/2022   Nonischemic cardiomyopathy (HCC) 09/22/2021   NSTEMI (non-ST elevated myocardial infarction) (HCC) 02/23/2022   Obstructive sleep apnea 06/30/2015   Polycystic ovarian disease      Assessment: Patient Reported Symptoms:  Cognitive Cognitive Status: No symptoms reported, Alert and oriented to person, place, and time, Normal speech and language skills Cognitive/Intellectual Conditions Management [RPT]: None reported or documented in medical history or problem list   Health Maintenance Behaviors: Annual physical exam  Neurological Neurological Review of Symptoms: Headaches Neurological Management Strategies: Medication therapy, Routine screening, Coping strategies Neurological Comment: Pt reports hx of ongoing HA  HEENT HEENT Symptoms Reported: Not assessed      Cardiovascular Cardiovascular Symptoms Reported: Other: Other Cardiovascular Symptoms: Elevated BP Does patient have uncontrolled Hypertension?: Yes Cardiovascular Management Strategies: Coping strategies, Medication therapy, Routine screening Cardiovascular Comment: Pt's BP has been elevated, medications were recently adjusted and pt has family to p/up medications  Respiratory Respiratory Symptoms Reported: No symptoms reported    Endocrine Endocrine Symptoms Reported: Not assessed    Gastrointestinal Gastrointestinal Symptoms Reported: No symptoms reported      Genitourinary Genitourinary Symptoms Reported: No symptoms reported    Integumentary Integumentary Symptoms Reported: No symptoms reported    Musculoskeletal Musculoskelatal Symptoms Reviewed: Unsteady gait, Difficulty walking, Muscle pain Musculoskeletal Management Strategies: Routine screening, Medication therapy, Coping strategies, Adequate rest, Medical device Musculoskeletal Comment: Pt participates in PT   Patient at Risk for Falls Due to: Impaired balance/gait, Impaired mobility  Psychosocial Psychosocial Symptoms Reported: Other Other Psychosocial Conditions: Stress/Chronic Pain Behavioral Management Strategies: Coping strategies, Medication therapy Behavioral Health Comment: Pt interested in community resources to assist with  holiday assistance Major  Change/Loss/Stressor/Fears (CP): Medical condition, self, Resources Techniques to [place] with Loss/Stress/Change: Diversional activities      06/19/2024    PHQ2-9 Depression Screening   Little interest or pleasure in doing things    Feeling down, depressed, or hopeless    PHQ-2 - Total Score    Trouble falling or staying asleep, or sleeping too much    Feeling tired or having little energy    Poor appetite or overeating     Feeling bad about yourself - or that you are a failure or have let yourself or your family down    Trouble concentrating on things, such as reading the newspaper or watching television    Moving or speaking so slowly that other people could have noticed.  Or the opposite - being so fidgety or restless that you have been moving around a lot more than usual    Thoughts that you would be better off dead, or hurting yourself in some way    PHQ2-9 Total Score    If you checked off any problems, how difficult have these problems made it for you to do your work, take care of things at home, or get along with other people    Depression Interventions/Treatment      There were no vitals filed for this visit.    Medications Reviewed Today     Reviewed by Ezzard Rolin BIRCH, LCSW (Social Worker) on 06/19/24 at 1303  Med List Status: <None>   Medication Order Taking? Sig Documenting Provider Last Dose Status Informant  Accu-Chek Softclix Lancets lancets [id number]  USE 1  TO CHECK GLUCOSE 4 TIMES DAILY  Patient not taking: Reported on 06/17/2024   Jeanelle Layman CROME, MD  Active Father           Med Note ANGELICA, Hadlie Gipson D   Wed Nov 01, 2023  1:20 PM) Have to pay out of pocket fees   Accu-Chek Softclix Lancets lancets [id number]  Use each in the morning, at noon, and at bedtime.  Patient not taking: Reported on 06/17/2024   Lorilee Sven SQUIBB, MD  Active   acetaminophen  (TYLENOL ) 500 MG tablet [id number]  Take 1-2 tablets (500-1,000 mg total) by mouth  every 6 (six) hours as needed for mild pain (pain score 1-3) or headache (OR CRAMPING). Leigh Venetia CROME, MD  Active   albuterol  (VENTOLIN  HFA) 108 (90 Base) MCG/ACT inhaler [id number]  Inhale 2 puffs into the lungs every 6 (six) hours as needed for wheezing or shortness of breath. Christia Budds, MD  Active   Atogepant  (QULIPTA ) 30 MG TABS [id number]  Take 1 tablet (30 mg total) by mouth daily. Leigh Venetia CROME, MD  Active   atorvastatin  (LIPITOR ) 80 MG tablet [id number]  Take 1 tablet (80 mg total) by mouth daily after supper. Everhart, Kirstie, DO  Active   bisacodyl  5 MG EC tablet [id number]  Take 2 tablets (10 mg total) by mouth every 24 hours as needed for constipation.   Active            Med Note (Waverley Krempasky D   Wed Nov 01, 2023  1:22 PM) Need a refill   blood glucose meter kit and supplies [id number]  Use up to 4 (four) times daily as directed Raulkar, Sven SQUIBB, MD  Active   Blood Glucose Monitoring Suppl (ACCU-CHEK GUIDE) w/Device KIT [id number]  Use up to 4 (four) times daily. Lorilee Sven SQUIBB, MD  Active   Discontinued 07/01/22 [id number]  Med Note>> Margrette Levorn POUR, [facility]   07/01/2022  9:52 AM changed to Toprol  XL    Cholecalciferol  (VITAMIN D ) 125 MCG (5000 UT) CAPS [id number]  Take one capsule by mouth once daily. McDiarmid, Krystal BIRCH, MD  Active   dabigatran  (PRADAXA ) 150 MG CAPS capsule [id number]  Take 1 capsule (150 mg total) by mouth 2 (two) times daily for anticoagulant. Christia Budds, MD  Active   empagliflozin  (JARDIANCE ) 25 MG TABS tablet [id number]  Take 1 tablet (25 mg total) by mouth daily. McDiarmid, Krystal BIRCH, MD  Active   escitalopram  (LEXAPRO ) 20 MG tablet [id number]  Take 1 tablet (20 mg total) by mouth at bedtime. Christia Budds, MD  Active   gabapentin  (NEURONTIN ) 300 MG capsule [id number]  Take 1 capsule (300 mg total) by mouth at bedtime.  Patient not taking: Reported on 06/17/2024   Everhart, Kirstie, DO  Active   glipiZIDE  (GLUCOTROL ) 5 MG tablet [id number]  Take 1  tablet (5 mg total) by mouth daily before breakfast. McDiarmid, Krystal BIRCH, MD  Active   glucose blood (ACCU-CHEK GUIDE) test strip [id number]  USE  4 TIMES DAILY Ganta, Anupa, DO  Active Father  hydrOXYzine  (ATARAX ) 10 MG tablet [id number]  Take 1 tablet (10 mg total) by mouth 3 (three) times daily as needed for anxiety. Baker, Raguel MATSU, DO  Active   levETIRAcetam  (KEPPRA ) 750 MG tablet [id number]  Take 1 tablet (750 mg total) by mouth 2 (two) times daily for seizures.   Active   lidocaine  (LIDODERM ) 5 % [id number]  Place 1 patch onto the skin daily. Remove & Discard patch within 12 hours or as directed by MD  Patient not taking: Reported on 06/17/2024   Lorilee Sven SQUIBB, MD  Active   losartan  (COZAAR ) 100 MG tablet [id number]  Take 1 tablet (100 mg total) by mouth daily. Baker, Raguel MATSU, DO  Active   meclizine  (ANTIVERT ) 25 MG tablet [id number]  Take 1 tablet (25 mg total) by mouth 3 (three) times daily as needed for dizziness. Odell Balls, PA-C  Active   melatonin 5 MG TABS [id number]  Take 1 tablet (5 mg total) by mouth at bedtime. Christia Budds, MD  Active   metoprolol  tartrate (LOPRESSOR ) 25 MG tablet [id number]  Take 1 tablet (25 mg total) by mouth 2 (two) times daily. McDiarmid, Krystal BIRCH, MD  Active   oxyCODONE  (ROXICODONE ) 5 MG immediate release tablet [id number]  Take 1 tablet (5 mg total) by mouth 3 (three) times daily as needed for severe pain (pain score 7-10). Lorilee Sven SQUIBB, MD  Active   pregabalin  (LYRICA ) 25 MG capsule [id number]  Take 1 capsule (25 mg total) by mouth 3 (three) times daily. Lorilee Sven SQUIBB, MD  Active   spironolactone  (ALDACTONE ) 25 MG tablet [id number]  Take 1 tablet (25 mg total) by mouth daily. McDiarmid, Krystal BIRCH, MD  Active   tiZANidine  (ZANAFLEX ) 4 MG tablet [id number]  Take 1 tablet (4 mg total) by mouth every 6 (six) hours as needed for muscle spasms. Lennie Raguel MATSU, DO  Active   torsemide  (DEMADEX ) 20 MG tablet [id number]  Take 1 tablet (20 mg total) by mouth  daily.  Patient not taking: Reported on 06/17/2024   Christia Budds, MD  Active Father  traZODone  (DESYREL ) 150 MG tablet [id number]  Take 1 tablet (150 mg total) by mouth at bedtime for insomnia. Everhart, Kirstie, DO  Active   Ubrogepant  (UBRELVY ) 100 MG TABS [id number]  Take 1 tablet (100 mg total) by mouth  daily as needed. Leigh Venetia CROME, MD  Active             Recommendation:   Continue Current Plan of Care  Follow Up Plan:   Telephone follow-up in 1 month  Rolin Ezzard HUGHS [facility]  [facility], [facility] Clinical Social Worker Direct Dial: [phone]  Fax: [phone] Website: [email or web address] 1:30 PM

## 2024-06-19 NOTE — Telephone Encounter (Signed)
 ATCx2 - LVMTCB

## 2024-06-19 NOTE — Telephone Encounter (Signed)
 ATC, LVMTCB,  Pt will need to reschedule appt for Monday, 06/24/24, as sleep study is not being completed until 07/09/24.

## 2024-06-19 NOTE — Patient Instructions (Signed)
 Visit Information  Thank you for taking time to visit with me today. Please don't hesitate to contact me if I can be of assistance to you before our next scheduled appointment.  Your next care management appointment is by telephone on 12/22 at 2 PM  Please call the care guide team at [phone] if you need to cancel, schedule, or reschedule an appointment.   Please call the Suicide and Crisis Lifeline: 988 go to [facility] Urgent [facility] [address] [place] [phone]) call 911 if you are experiencing a Mental Health or Behavioral Health Crisis or need someone to talk to.  Rolin Kerns, LCSW [place]  [facility], [facility] Clinical Social Worker Direct Dial: [phone]  Fax: [phone] Website: [email or web address] 1:32 PM

## 2024-06-24 ENCOUNTER — Ambulatory Visit: Admitting: Adult Health

## 2024-06-25 ENCOUNTER — Telehealth: Payer: Self-pay

## 2024-06-25 NOTE — Progress Notes (Unsigned)
 Complex Care Management Care Guide Note  06/25/2024 Name: [name] MRN: [id number] DOB: 25-Jul-1976  [name] is a 48 y.o. year old female who is a primary care patient of Everhart, Kirstie, DO and is actively engaged with the care management team. I reached out to [name] by phone today to assist with re-scheduling  with the RN Case Manager.  Follow up plan: Unsuccessful telephone outreach attempt made. A HIPAA compliant phone message was left for the patient providing contact information and requesting a return call.  Leotis Rase [facility], [facility] Guide  Direct Dial: [phone]  Fax [phone]

## 2024-06-27 NOTE — Progress Notes (Signed)
 Complex Care Management Care Guide Note  06/27/2024 Name: [name] MRN: [id number] DOB: [birth year]  [name] is a 48 y.o. year old female who is a primary care patient of Everhart, Elyce, DO and is actively engaged with the care management team. I reached out to [name] by phone today to assist with re-scheduling  with the RN Case Manager.  Follow up plan: Telephone appointment with complex care management team member scheduled for:  07/12/24 @ 3:30 PM  Leotis Rase [facility], [facility] Guide  Direct Dial: [phone]  Fax [phone]

## 2024-06-27 NOTE — Progress Notes (Signed)
 Complex Care Management Note Care Guide Note  06/27/2024 Name: [name] MRN: [id number] DOB: 1975-08-08   Complex Care Management Outreach Attempts: A second unsuccessful outreach was attempted today to offer the patient with information about available complex care management services.  Follow Up Plan:  No further outreach attempts will be made at this time. We have been unable to contact the patient to offer or enroll patient in complex care management services.  Encounter Outcome:  No Answer-Left voicemail  Leotis Rase [facility], [facility] Guide  Direct Dial: [phone]  Fax [phone]

## 2024-07-05 ENCOUNTER — Other Ambulatory Visit (HOSPITAL_COMMUNITY): Payer: Self-pay

## 2024-07-05 ENCOUNTER — Telehealth (HOSPITAL_COMMUNITY): Payer: Self-pay

## 2024-07-05 ENCOUNTER — Encounter: Payer: Self-pay | Admitting: Family Medicine

## 2024-07-05 ENCOUNTER — Ambulatory Visit: Admitting: Family Medicine

## 2024-07-05 VITALS — BP 140/90 | HR 70 | Ht <= 58 in | Wt 137.6 lb

## 2024-07-05 DIAGNOSIS — G43009 Migraine without aura, not intractable, without status migrainosus: Secondary | ICD-10-CM

## 2024-07-05 DIAGNOSIS — Z8639 Personal history of other endocrine, nutritional and metabolic disease: Secondary | ICD-10-CM

## 2024-07-05 DIAGNOSIS — Z1211 Encounter for screening for malignant neoplasm of colon: Secondary | ICD-10-CM

## 2024-07-05 DIAGNOSIS — F419 Anxiety disorder, unspecified: Secondary | ICD-10-CM

## 2024-07-05 DIAGNOSIS — I63411 Cerebral infarction due to embolism of right middle cerebral artery: Secondary | ICD-10-CM

## 2024-07-05 DIAGNOSIS — Z1231 Encounter for screening mammogram for malignant neoplasm of breast: Secondary | ICD-10-CM

## 2024-07-05 DIAGNOSIS — E1159 Type 2 diabetes mellitus with other circulatory complications: Secondary | ICD-10-CM

## 2024-07-05 DIAGNOSIS — K5900 Constipation, unspecified: Secondary | ICD-10-CM

## 2024-07-05 LAB — POCT GLYCOSYLATED HEMOGLOBIN (HGB A1C): HbA1c, POC (controlled diabetic range): 5.9 % (ref 0.0–7.0)

## 2024-07-05 MED ORDER — ESCITALOPRAM OXALATE 20 MG PO TABS
20.0000 mg | ORAL_TABLET | Freq: Every day | ORAL | 1 refills | Status: DC
  Filled 2024-07-05: qty 90, 90d supply, fill #0

## 2024-07-05 MED ORDER — ESCITALOPRAM OXALATE 20 MG PO TABS: 20.0000 mg | ORAL_TABLET | Freq: Every day | ORAL | 1 refills | Status: AC

## 2024-07-05 MED ORDER — SENNA 8.6 MG PO TABS
1.0000 | ORAL_TABLET | Freq: Every day | ORAL | 0 refills | Status: AC
  Filled 2024-07-05: qty 10, 10d supply, fill #0

## 2024-07-05 MED ORDER — AMLODIPINE-OLMESARTAN 5-40 MG PO TABS
1.0000 | ORAL_TABLET | Freq: Every day | ORAL | 3 refills | Status: DC
  Filled 2024-07-05 (×2): qty 30, 30d supply, fill #0
  Filled 2024-08-08: qty 30, 30d supply, fill #1

## 2024-07-05 MED ORDER — ATORVASTATIN CALCIUM 80 MG PO TABS
80.0000 mg | ORAL_TABLET | Freq: Every day | ORAL | 5 refills | Status: AC
  Filled 2024-07-05: qty 30, 30d supply, fill #0
  Filled 2024-08-08: qty 30, 30d supply, fill #1

## 2024-07-05 MED ORDER — DABIGATRAN ETEXILATE MESYLATE 150 MG PO CAPS
150.0000 mg | ORAL_CAPSULE | Freq: Two times a day (BID) | ORAL | 0 refills | Status: DC
  Filled 2024-07-05 – 2024-07-08 (×3): qty 60, 30d supply, fill #0

## 2024-07-05 MED ORDER — LEVETIRACETAM 750 MG PO TABS
750.0000 mg | ORAL_TABLET | Freq: Two times a day (BID) | ORAL | 3 refills | Status: DC
  Filled 2024-07-05: qty 60, 30d supply, fill #0

## 2024-07-05 NOTE — Assessment & Plan Note (Addendum)
 Poorly controlled on losartan  100mg .  Will switch to amlodipine  olmesartan 5-40mg  daily F/u in 3 weeks, check BMP at that time Side effects discussed

## 2024-07-05 NOTE — Progress Notes (Cosign Needed)
° ° °  SUBJECTIVE:   CHIEF COMPLAINT / HPI:   HTN f/u Seen 06/17/24 for HTN, increased Losartan  to 100mg  daily Blood pressure still high.  Not having any side effects from increased dose  Constipation  LBM 3 days ago Bisacodyl  and MiraLAX  not helping  Has appt with neurology in 07/2024 Unclear what medications she has been taking  Needs refill on Lexapro   PERTINENT  PMH / PSH: paroxysmal afib, CVA, HTN, NICM, OSA, HLD  OBJECTIVE:   BP (!) 140/90 (BP Location: Right Arm, Patient Position: Sitting, Cuff Size: Normal)   Pulse 70   Ht 4' 9 (1.448 m)   Wt 137 lb 9.6 oz (62.4 kg)   SpO2 100%   BMI 29.78 kg/m   General: well appearing, NAD Cardiovascular: RRR, no m/r/g Respiratory: normal work of breathing on RA, CTAB Sitting in wheelchair, LUE residual weakness from prior stroke  ASSESSMENT/PLAN:   Assessment & Plan Hypertension associated with diabetes (HCC) Poorly controlled on losartan  100mg .  Will switch to amlodipine  olmesartan 5-40mg  daily F/u in 3 weeks, check BMP at that time Side effects discussed History of diabetes mellitus A1c controlled at 5.9 today, no medications Anxiety Refilled lexapro  20mg   Screening for colon cancer Ordered colonoscopy Encounter for screening mammogram for malignant neoplasm of breast Ordered mammogram and provided number to schedule Cerebrovascular accident (CVA) due to embolism of right middle cerebral artery (HCC) Per last neurology note, pt is supposed to be taking dabigatran  and keppra  daily, it does not look like these have been filled, she is unsure if she is taking them I have refilled them and called to notify pt She has f/u with neurology 07/2024 Constipation, unspecified constipation type Senna and miralax  x10 days Stop bisacodyl      Elyce Prescott, DO [facility]

## 2024-07-05 NOTE — Assessment & Plan Note (Signed)
 Per last neurology note, pt is supposed to be taking dabigatran  and keppra  daily, it does not look like these have been filled, she is unsure if she is taking them I have refilled them and called to notify pt She has f/u with neurology 07/2024

## 2024-07-05 NOTE — Patient Instructions (Addendum)
 Good to see you today - Thank you for coming in  Things we discussed today:  We have switched you to amlodipine  olmesartan for your blood pressure. STOP taking Losartan . I will see you in 3 weeks to recheck your blood pressure and get lab work. I have ordered a colonoscopy and mammogram.  You will need to call to schedule your mammogram [phone]  I have ordered senna to take daily with MiraLAX  until you have 1 bowel movement per day.  Let me know if this is not working.

## 2024-07-05 NOTE — Assessment & Plan Note (Addendum)
 A1c controlled at 5.9 today, no medications

## 2024-07-08 ENCOUNTER — Other Ambulatory Visit (HOSPITAL_COMMUNITY): Payer: Self-pay

## 2024-07-08 ENCOUNTER — Telehealth (HOSPITAL_COMMUNITY): Payer: Self-pay

## 2024-07-08 NOTE — Telephone Encounter (Addendum)
 Pharmacy Patient Advocate Encounter  Received notification from [employer] that Prior Authorization for Dabigatran  Etexilate Mesylate 150MG  capsules  has been APPROVED from 07/08/24 to 07/24/25. Ran test claim, Copay is $1.60. This test claim was processed through [facility]- copay amounts may vary at other pharmacies due to pharmacy/plan contracts, or as the patient moves through the different stages of their insurance plan.   PA #/Case ID/Reference #: [id number]  *I have submitted it to be filled, she should be able to pick it up either this evening or tomorrow.

## 2024-07-08 NOTE — Telephone Encounter (Signed)
 Pharmacy Patient Advocate Encounter   Received notification from Pt Calls Messages that prior authorization for Dabigatran  150 mg capsules is required/requested.   Insurance verification completed.   The patient is insured through [employer].   Per test claim:  Xarelto  is preferred by the insurance.  If suggested medication is appropriate, Please send in a new RX and discontinue this one. If not, please advise as to why it's not appropriate so that we may request a Prior Authorization. Please note, some preferred medications may still require a PA.  If the suggested medications have not been trialed and there are no contraindications to their use, the PA will not be submitted, as it will not be approved.  *If Xarelto  is not clinically appropriate please let me know and I will attempt the PA for the Pradaxa .

## 2024-07-08 NOTE — Telephone Encounter (Signed)
 PA request has been Received. New Encounter has been or will be created for follow up. For additional info see Pharmacy Prior Auth telephone encounter from 07/08/24.

## 2024-07-08 NOTE — Telephone Encounter (Signed)
 Pharmacy Patient Advocate Encounter   Received notification from Pt Calls Messages that prior authorization for Dabigatran  Etexilate Mesylate 150MG  capsules  is required/requested.   Insurance verification completed.   The patient is insured through [employer].   Per test claim: PA required; PA submitted to above mentioned insurance via Latent Key/confirmation #/EOC [id number] Status is pending

## 2024-07-11 ENCOUNTER — Ambulatory Visit (HOSPITAL_BASED_OUTPATIENT_CLINIC_OR_DEPARTMENT_OTHER): Admitting: Pulmonary Disease

## 2024-07-12 ENCOUNTER — Encounter: Attending: Physical Medicine and Rehabilitation | Admitting: Physical Medicine and Rehabilitation

## 2024-07-12 ENCOUNTER — Telehealth: Admitting: *Deleted

## 2024-07-12 DIAGNOSIS — I69954 Hemiplegia and hemiparesis following unspecified cerebrovascular disease affecting left non-dominant side: Secondary | ICD-10-CM | POA: Insufficient documentation

## 2024-07-15 ENCOUNTER — Other Ambulatory Visit: Payer: Self-pay | Admitting: Family Medicine

## 2024-07-15 ENCOUNTER — Other Ambulatory Visit: Payer: Self-pay | Admitting: Physical Medicine and Rehabilitation

## 2024-07-15 ENCOUNTER — Other Ambulatory Visit: Payer: Self-pay

## 2024-07-15 ENCOUNTER — Other Ambulatory Visit (HOSPITAL_COMMUNITY): Payer: Self-pay

## 2024-07-15 ENCOUNTER — Telehealth: Payer: Self-pay

## 2024-07-15 ENCOUNTER — Other Ambulatory Visit: Payer: Self-pay | Admitting: Licensed Clinical Social Worker

## 2024-07-15 DIAGNOSIS — F5101 Primary insomnia: Secondary | ICD-10-CM

## 2024-07-15 NOTE — Telephone Encounter (Signed)
 Jasmine with [facility] calls nurse line reporting a yeast infection in patient.   I called patient to discuss. She reports I dont know what you want me to say, I have something going on down there. She denies any sexual activity.   She reports there is a stench and reports watery whitish discharge.   Advised will forward to PCP, however she may need to come in for testing.   Patient replies, You want me to come in there so yall can dig around when I know what I have.  Will forward to PCP.

## 2024-07-16 ENCOUNTER — Other Ambulatory Visit (HOSPITAL_COMMUNITY): Payer: Self-pay

## 2024-07-16 ENCOUNTER — Encounter: Payer: Self-pay | Admitting: *Deleted

## 2024-07-16 ENCOUNTER — Other Ambulatory Visit: Payer: Self-pay | Admitting: Family Medicine

## 2024-07-16 ENCOUNTER — Telehealth: Payer: Self-pay | Admitting: *Deleted

## 2024-07-16 ENCOUNTER — Other Ambulatory Visit: Payer: Self-pay

## 2024-07-16 ENCOUNTER — Encounter (HOSPITAL_COMMUNITY): Payer: Self-pay

## 2024-07-16 DIAGNOSIS — F5101 Primary insomnia: Secondary | ICD-10-CM

## 2024-07-16 MED ORDER — FLUCONAZOLE 150 MG PO TABS
150.0000 mg | ORAL_TABLET | Freq: Once | ORAL | 0 refills | Status: AC
  Filled 2024-07-16: qty 1, 1d supply, fill #0

## 2024-07-16 MED ORDER — TRAZODONE HCL 150 MG PO TABS
150.0000 mg | ORAL_TABLET | Freq: Every evening | ORAL | 3 refills | Status: AC
  Filled 2024-07-16: qty 30, 30d supply, fill #0
  Filled 2024-08-08: qty 30, 30d supply, fill #1

## 2024-07-16 MED ORDER — MELATONIN 5 MG PO TABS
5.0000 mg | ORAL_TABLET | Freq: Every day | ORAL | 5 refills | Status: AC
  Filled 2024-07-16 – 2024-08-08 (×2): qty 30, 30d supply, fill #0

## 2024-07-16 NOTE — Patient Instructions (Signed)
 [name] - I am sorry I was unable to reach you today for our scheduled appointment. I work with Sports Coach, Kirstie, DO and am calling to support your healthcare needs. Please contact me at [phone] at your earliest convenience. I look forward to speaking with you soon.   Thank you,   Olam Ku, RN, BSN [place]  [facility], [facility] Health RN Care Manager Direct Dial: [phone]  Fax: [phone]

## 2024-07-16 NOTE — Telephone Encounter (Signed)
 Calling for a refill on her oxycodone .

## 2024-07-22 NOTE — Patient Instructions (Signed)
 Visit Information  Thank you for taking time to visit with me today. Please don't hesitate to contact me if I can be of assistance to you before our next scheduled appointment.  Your next care management appointment is by telephone on 02/02 at 2 PM  Please call the care guide team at [phone] if you need to cancel, schedule, or reschedule an appointment.   Please call the Suicide and Crisis Lifeline: 988 go to [facility] Urgent [facility] [address], [place] [phone]) call 911 if you are experiencing a Mental Health or Behavioral Health Crisis or need someone to talk to.  Rolin Kerns, LCSW [place]  [facility], [facility] Clinical Social Worker Direct Dial: [phone]  Fax: [phone] Website: [email or web address] 8:50 AM

## 2024-07-22 NOTE — Patient Outreach (Signed)
 Complex Care Management   Visit Note  07/15/2024  Name:  [name] MRN: [id number] DOB: October 29, 1975  Situation: Referral received for Complex Care Management related to Mental/Behavioral Health diagnosis MDD I obtained verbal consent from Patient.  Visit completed with Patient  on the phone  Background:   Past Medical History:  Diagnosis Date   Abnormal Pap smear    Acute combined systolic and diastolic heart failure (HCC) 02/19/2019   Wt Readings from Last 3 Encounters:  09/18/21  84.7 kg  09/02/21  84.4 kg  08/31/21  84.2 kg         Acute heart failure (HCC) 01/29/2022   Acute on chronic combined systolic and diastolic CHF (congestive heart failure) (HCC) 02/19/2019   Acute on chronic congestive heart failure (HCC)    Acute on chronic congestive heart failure (HCC)    Acute on chronic diastolic CHF (congestive heart failure) (HCC) 10/19/2021   Acute on chronic systolic heart failure (HCC) 02/22/2022   Anemia    Asthma    Atrial fibrillation (HCC)    history of paroxysmal atrial fibrillation   CHF (congestive heart failure) (HCC) 11/02/2021   CHF exacerbation (HCC) 10/17/2021   Diabetes mellitus    DKA, type 2 (HCC) 02/06/2019   Elevated troponin 09/30/2019   Heart failure with reduced ejection fraction (HCC)    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  Consider chronic etiology given continued fertility issues   Hypertension    Hypertensive emergency 09/30/2019   Hypertensive urgency 06/02/2022   Major depressive disorder 02/08/2012   Reports that her infertility is a major cause of her depression Reports Prozac  has worked in the past for her.     Migraines    Morbid obesity (HCC) 02/06/2019   Nausea & vomiting 06/26/2022   Nonischemic cardiomyopathy (HCC) 09/22/2021   NSTEMI (non-ST elevated myocardial infarction) (HCC) 02/23/2022   Obstructive sleep apnea 06/30/2015   Polycystic ovarian disease      Assessment: Patient Reported Symptoms:  Cognitive Cognitive Status: No symptoms reported      Neurological Neurological Review of Symptoms: Not assessed    HEENT HEENT Symptoms Reported: Not assessed      Cardiovascular Cardiovascular Symptoms Reported: Other: Cardiovascular Management Strategies: Medication therapy, Coping strategies Cardiovascular Comment: BP medications were adjusted and pt reports compliance  Respiratory Respiratory Symptoms Reported: No symptoms reported    Endocrine Endocrine Symptoms Reported: No symptoms reported Is patient diabetic?: Yes    Gastrointestinal Gastrointestinal Symptoms Reported: Constipation Gastrointestinal Management Strategies: Coping strategies, Medication therapy Gastrointestinal Comment: Senna has helped with symptom relief, per pt    Genitourinary Genitourinary Symptoms Reported: Not assessed    Integumentary Integumentary Symptoms Reported: Not assessed    Musculoskeletal Musculoskelatal Symptoms Reviewed: Limited mobility, Muscle pain, Joint pain, Back pain Musculoskeletal Management Strategies: Coping strategies, Medication therapy, Routine screening, Exercise Musculoskeletal Comment: Continue PT      Psychosocial   Behavioral Management Strategies: Coping strategies, Medication therapy, Support system Major Change/Loss/Stressor/Fears (CP): Medical condition, self Techniques to Cope with Loss/Stress/Change: Diversional activities, Medication, Exercise Quality of Family Relationships: involved, helpful    07/22/2024    PHQ2-9 Depression Screening   Little interest or pleasure in doing things    Feeling down, depressed, or hopeless    PHQ-2 - Total Score    Trouble falling or staying asleep, or sleeping too much    Feeling tired or having little energy    Poor  appetite or overeating     Feeling bad about yourself - or that you are a failure or have let yourself or your family down    Trouble concentrating on things,  such as reading the newspaper or watching television    Moving or speaking so slowly that other people could have noticed.  Or the opposite - being so fidgety or restless that you have been moving around a lot more than usual    Thoughts that you would be better off dead, or hurting yourself in some way    PHQ2-9 Total Score    If you checked off any problems, how difficult have these problems made it for you to do your work, take care of things at home, or get along with other people    Depression Interventions/Treatment      There were no vitals filed for this visit.    Medications Reviewed Today     Reviewed by Ezzard Rolin BIRCH, LCSW (Social Worker) on 07/22/24 at [phone]  Med List Status: <None>   Medication Order Taking? Sig Documenting Provider Last Dose Status Informant  Accu-Chek Softclix Lancets lancets [id number]  USE 1  TO CHECK GLUCOSE 4 TIMES DAILY  Patient not taking: Reported on 06/17/2024   Jeanelle Layman CROME, MD  Active Father           Med Note ANGELICA, [place] Veldman D   Wed Nov 01, 2023  1:20 PM) Have to pay out of pocket fees   Accu-Chek Softclix Lancets lancets [id number]  Use each in the morning, at noon, and at bedtime.  Patient not taking: Reported on 06/17/2024   Lorilee Sven SQUIBB, MD  Active   acetaminophen  (TYLENOL ) 500 MG tablet [id number]  Take 1-2 tablets (500-1,000 mg total) by mouth every 6 (six) hours as needed for mild pain (pain score 1-3) or headache (OR CRAMPING). Leigh Venetia CROME, MD  Active   albuterol  (VENTOLIN  HFA) 108 (90 Base) MCG/ACT inhaler [id number]  Inhale 2 puffs into the lungs every 6 (six) hours as needed for wheezing or shortness of breath. Christia Budds, MD  Active   amLODipine -olmesartan  (AZOR ) 5-40 MG tablet [id number]  Take 1 tablet by mouth daily. Everhart, Kirstie, DO  Active   Atogepant  (QULIPTA ) 30 MG TABS [id number]  Take 1 tablet (30 mg total) by mouth daily. Leigh Venetia CROME, MD  Active   atorvastatin  (LIPITOR ) 80 MG tablet [id number]  Take  1 tablet (80 mg total) by mouth daily after supper. Everhart, Kirstie, DO  Active   blood glucose meter kit and supplies [id number]  Use up to 4 (four) times daily as directed Raulkar, Sven SQUIBB, MD  Active   Blood Glucose Monitoring Suppl (ACCU-CHEK GUIDE) w/Device KIT [id number]  Use up to 4 (four) times daily. Lorilee Sven SQUIBB, MD  Active   Discontinued 07/01/22 [phone]          Med Note>> Margrette Levorn POUR, [facility]   07/01/2022  9:52 AM changed to Toprol  XL    Cholecalciferol  (VITAMIN D ) 125 MCG (5000 UT) CAPS [id number]  Take one capsule by mouth once daily. McDiarmid, Krystal BIRCH, MD  Active   dabigatran  (PRADAXA ) 150 MG CAPS capsule [id number]  Take 1 capsule (150 mg total) by mouth 2 (two) times daily for anticoagulant. Everhart, Kirstie, DO  Active   escitalopram  (LEXAPRO ) 20 MG tablet [id number]  Take 1 tablet (20 mg total) by mouth at bedtime. Everhart, Kirstie, DO  Active   glucose blood (ACCU-CHEK GUIDE) test strip [id number]  USE  4 TIMES DAILY Ganta, Anupa, DO  Active Father  hydrOXYzine  (ATARAX ) 10 MG tablet [id number]  Take 1 tablet (10 mg total) by mouth 3 (three) times daily as needed for anxiety. Baker, Raguel MATSU, DO  Active   levETIRAcetam  (KEPPRA ) 750 MG tablet [id number]  Take 1 tablet (750 mg total) by mouth 2 (two) times daily. Everhart, Kirstie, DO  Active   meclizine  (ANTIVERT ) 25 MG tablet [id number]  Take 1 tablet (25 mg total) by mouth 3 (three) times daily as needed for dizziness. Odell Balls, PA-C  Active   melatonin 5 MG TABS [id number]  Take 1 tablet (5 mg total) by mouth at bedtime. Everhart, Kirstie, DO  Active   oxyCODONE  (ROXICODONE ) 5 MG immediate release tablet [id number]  Take 1 tablet (5 mg total) by mouth 3 (three) times daily as needed for severe pain (pain score 7-10). Lorilee Sven SQUIBB, MD  Active   pregabalin  (LYRICA ) 25 MG capsule [id number]  Take 1 capsule (25 mg total) by mouth 3 (three) times daily. Lorilee Sven SQUIBB, MD  Active   tiZANidine  (ZANAFLEX ) 4 MG tablet  [id number]  Take 1 tablet (4 mg total) by mouth every 6 (six) hours as needed for muscle spasms. Lennie Raguel MATSU, DO  Active   traZODone  (DESYREL ) 150 MG tablet [id number]  Take 1 tablet (150 mg total) by mouth at bedtime for insomnia. Everhart, Kirstie, DO  Active   Ubrogepant  (UBRELVY ) 100 MG TABS [id number]  Take 1 tablet (100 mg total) by mouth daily as needed. Leigh Venetia CROME, MD  Active             Recommendation:   PCP Follow-up  Follow Up Plan:   Telephone follow-up in 1 month  Rolin Kerns, LCSW [facility]  [facility], [facility] Clinical Social Worker Direct Dial: [phone]  Fax: [phone] Website: [email or web address] 8:49 AM

## 2024-07-26 NOTE — Progress Notes (Signed)
 "  NEUROLOGY FOLLOW UP OFFICE NOTE  [name] [id number]  Subjective:  [name] is a 49 y.o. year old right-handed female with a medical history of HTN, HLD, DM, stroke, HFrEF, afib, CAD, OSA, migraines who we last saw on 01/31/24 for stroke, seizures, and migraines.  To briefly review: 08/03/23: Patient had acute onset left sided weakness on 09/24/22 while admitted for SOB 2/2 CHF. She had LV thrombus as well. Per H&P from 09/24/22: This is a 49 year old patient admitted with shortness of breath secondary to acute combined CHF exacerbation and also found to have an LV thrombus on heparin  drip until this morning on whom neurology is consulted for stroke code for left-sided weakness.  Other acute medical problems include acute on chronic combined systolic and diastolic heart failure, elevated troponin (elevated to 197 but trended down to 170, no EKG changes, felt to be likely demand ischemia secondary to CHF exacerbation), poorly controlled diabetes type 2 with last A1c 10.8, paroxysmal A-fib noncompliant with warfarin as an outpatient, hypertension.  Last known well was 12:40 PM, after that time patient developed left-sided weakness and right gaze deviation.  Rapid and then stroke code were activated.  CT head showed loss of gray-white differentiation in the right insula likely representing early infarct with an aspects of 9.  Patient was on heparin  drip until this morning for LV thrombus which was shut off at the time the stroke code was called.  Therefore she was not a candidate for TNK.  NIH stroke scale was 16.  Patient has no deficits at baseline.  CT angiogram showed a proximal right M2 occlusion.    TICI2B revascularization was achieved. Per D/C summary from 10/01/22: She had a brief cardiac arrest during the procedure but was resuscitated after 2 minutes. She was initially on heparin  drip but now has been changed to Eliquis , which she will need to take indefinitely for cardiac thrombus and  atrial fibrillation. She is doing well and is ready to be discharged to rehabilitation.  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  Aspects is 9 CTA head & neck:  Proximal right M2 occlusion with minimal collateral flow throughout the MCA distribution. IR - Occluded inferior division of the right middle cerebral artery in the mid M2 segment. TICI 2B revascularization.  Post IR CT: no evidence of intracranial hemorrhage.  MRI: Large area of acute infarct in the right MCA distribution, smaller area of acute infarct in the right ACA distribution, and punctate acute infarct in the left cerebellar hemisphere. No evidence of hemorrhagic transformation or midline shift. MRA head - Persistent occlusion of a right M2 branch with minimal collateral flow in the MCA distribution aside from in the region of the frontal operculum. CT repeat 3/4 stable right superior division MCA territory and distal right ACA territory infarcts with petechial hemorrhage. 2D Echo: Large LV apical thrombus present, LVEF 15% LDL 97 HgbA1c 10.8 VTE prophylaxis - SCDs, Eliquis  warfarin daily prior to admission (?compliance), now on eliquis  5mg  BID (heparin  off 3/8)    Neuro exam on discharge per D/C summary: NEURO:  Mental Status: AA&Ox3  Speech/Language: speech is with mild dysarthria   Cranial Nerves:  II: PERRL.  III, IV, VI: Right gaze preference but can cross midline barely V: Sensation  is intact to light touch and diminished on the left VII: Slight left facial droop VIII: hearing intact to voice. IX, X: Voice is quiet and slightly dysarthric XII: tongue is midline without fasciculations. Motor: 5/5 strength to right upper and lower extremities, 0 out of 5 strength to left upper  extremity, 1 out of 5 strength to left lower extremity Tone: is normal and bulk is normal Sensation- Intact to light touch bilaterally but diminished on the left Gait- deferred   Patient went to inpatient rehab from 10/01/22 to 11/01/22. Per d/c summary from 11/01/22: Hospital Course: [name] was admitted to rehab 10/01/2022 for inpatient therapies to consist of PT, ST and OT at least three hours five days a week. Past admission physiatrist, therapy team and rehab RN have worked together to provide customized collaborative inpatient rehab. She continues on Eliquis  and follow up CBC shows H/H to be trending up due likely due to hemoconcentration. Her blood pressures were monitored on TID basis and has been stable. Serial labs were relatively stable till 04/08 when she was noted to have rise in BUN/SCr to 25/1.62. Her LOS was extended by a day for gentle hydration with IVF with follow up labs showing improvement in SCr to 1.42. Demadex , Spironolactone  and Jardiance  were held for 24 hours and recommend repeat BMET on follow up cardiology appointment the next day.  Vitamin D  supplement was added due to vitamin D  insufficiency.    She continues to report pain left side with shooting pains in arm and leg requiring use of MS IR as needed.  She also reports ongoing issues with spasms at night affecting overall sleep hygiene.  BLE dopplers done on 03/16 due to reports of BLE pain and were negative for DVT. Robaxin  has been used to help manage spasms and melatonin was added to help with sleep hygiene.  She was noted to have depressed mood therefore Lexapro  added for mood stabilization.  Team has been providing ego support as well as encouragement during his stay.  P.o. intake has varied due to dislike of hospital food and megace  was added to help stimulate appetite.  Family has provided additional food from home to help with intake.  Patient's blood sugars were monitored on ACHS basis and glipizide  was added to help  with blood sugar control.  Bowel program has been augmented to help manage constipation.   Amantadine  was added for activation and to help with attention to tasks. She has developed increased tone due to left hemiplegia has been managed with splinting, range of motion as well as local measures including K-pad and Sportscreme.  She has been advised to continue using her resting splints at night. She continues to be limited by left sided weakness with sensory deficits. Family has been educated on managing patient at wheelchair level. Patient has been educated on safe swallow strategies as well as utilization of external and internal aids to assist with memory and recall. She will continue to receive follow up HHPT, HHOT, HHST and HHRN by [facility] after discharge.     Rehab course: During patient's stay in rehab weekly team conferences were held to monitor patient's progress, set goals and discuss barriers to discharge. At admission, patient required mod to max assist with ADL tasks and max assist with mobility. She exhibited mild dysarthria with decreased in vocal intenstiy and needed modified diet due to mild bolus holding with pocketing and decrease in bolus formation.  She  has had improvement in activity tolerance,  balance, postural control as well as ability to compensate for deficits. She requires min assist for bathing tasks and for upper body dressing.  She requires mod assist for lower body dressing.  She requires min assist for transfers with tactile cues for sequencing with, weight shifting and for initiation.  She is able to ambulate 50 feet with max assist and use of a walker.  She continues to require supervision to min assist for cognition l.  She requires supervision at meals and has refused trials of regular textures.  She requested continuing dysphagia 3 diet and was discharged on this per her preference.   Family education has been completed regarding all aspects of physical and  cognitive assistance needed after discharge.  Family was also advised to manage and administer medications at discharge.   Disposition: Home.   Patient continues to get physical therapy and speech therapy in her home. She is not sure she is still making progress.    She has left sided weakness. She has difficulty with memory. She also has a lot of headaches. It is in the back or all over her head. She rates the pain 9/10. It is a throbbing pain that she calls a migraine. It can last 3 days. She had rare migraines prior to the stroke. This occurs a few times per month. There is associated dizziness and nausea.  She has photophobia and phonophobia as well. She will take Goody's headache powder that helps sometimes. She was given meclizine  that helps with the dizziness part of the migraine.   She is no longer on eliquis  but was switched to Dabigatran  a few months ago. She continues to take atorvastatin  90 mg daily.   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 She is on Keppra  750 mg BID. She has not had any seizures since being on Keppra .   She is a never smoker.   Patient has OSA but lost her CPAP. Per Dad, she wasn't using it when she had it. She does not have a sleep medicine doctor.   Of note, CTA chest/abd/pelvis at ED on 07/21/23 showed anterior mediastinum soft tissue density. PCP is following with a repeat CT chest in 6-8 weeks.  01/31/24: Patient has difficulty walking. She feels like her left leg is locking up. She does not have much independence. She walks with a walker.   She has completed PT and speech therapy. She continues with home exercises.   She never saw sleep medicine. She does not think she snores anymore, so not sure is she has OSA.   She continues to take Keppra  750 mg BID, dabigatran  150 mg BID, and atorvastatin  80 mg daily. She is no longer taking flexeril .   She has no concern for stroke or seizure since last visit.    Patient is having more headaches, almost every day. She has increased stress due to family staying with her, so that is contributing. She has less headaches when she is alone. She will take Ubrelvy  and this will relieve her headaches.   Patient also mentions wanting to drive today.  Most recent Assessment and Plan (01/31/24): This is [name], a 49 y.o. 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 She has significant weakness and spasticity in LUE > LLE. Left foot drop contributing to poor ambulation. 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 No further seizures on Keppra  Spasticity - complication of stroke. Patient is very spastic in  LUE, less so in LLE, but is having difficulty walking with possible contributions from spasticity. Migraines - respond to Ubrelvy . Was only having 3 headaches per month, but now almost daily. She has contraindications to most preventatives. TCA or SSRI is contraindicated as she is already on Lexapro , propranolol as she is on metoprolol , topamax as she is on other diuretics. She prefers a pill to a shot, so I will order Qulipta .   Plan: -Continue Keppra  750 mg BID for seizures -For secondary stroke prevention: continue dabigatraban 150 mg BID and atorvastatin  80 mg daily -Sleep medicine referral for untreated OSA -OT drivers evaluation referral at patient's request. I'm not sure she will be safely able to drive, but this is very important to her. -Referral to Dr. Carilyn for consideration of botox  for spasticity -Recommend wearing AFO - has one but does not wear it -Fall precautions discussed -Stroke precautions discussed   For migraines: Migraine prevention:  Start Qulipta  30 mg daily. Will get start approval process Migraine rescue:  Continue Ubrelvy  100 mg as needed at headache onset Limit use of pain relievers to no more than 2 days out of week to prevent risk of rebound or medication-overuse headache. Keep headache diary  Since  their last visit: Qulipta  was approved from 01/31/24 to 07/24/24. Her headaches have not changed despite taking Qulipta . She is having headaches daily. She will call the headaches severe 2 times per week. She endorses eating once a day and wonders if this is contributing. She thinks she is drinking enough, but this is ginger ale. She will take Ubrelvy  but is confused about her medications. She is sure she is taking Qulipta . She has photophobia.   She is seeing Dr. Carilyn for botox . She thinks this is helping with her left leg as she is better able to use it. She has not seen any improvement in left arm yet.  She is still on Keppra  750 mg BID. She has not have any current concerns for seizures.  Patient did not see sleep medicine. She cancelled the sleep study because she did not have anyone to go with her.   She is still on dabigatraban 150 mg BID and atorvastatin  80 mg daily.  She still wants to drive. She did not do OT drivers evaluation. This is her primary concern today.   MEDICATIONS:  Outpatient Encounter Medications as of 08/07/2024  Medication Sig Note   acetaminophen  (TYLENOL ) 500 MG tablet Take 1-2 tablets (500-1,000 mg total) by mouth every 6 (six) hours as needed for mild pain (pain score 1-3) or headache (OR CRAMPING).    amLODipine -olmesartan  (AZOR ) 5-40 MG tablet Take 1 tablet by mouth daily.    Atogepant  (QULIPTA ) 30 MG TABS Take 1 tablet (30 mg total) by mouth daily.    atorvastatin  (LIPITOR ) 80 MG tablet Take 1 tablet (80 mg total) by mouth daily after supper.    blood glucose meter kit and supplies Use up to 4 (four) times daily as directed    Blood Glucose Monitoring Suppl (ACCU-CHEK GUIDE) w/Device KIT Use up to 4 (four) times daily.    Cholecalciferol  (VITAMIN D ) 125 MCG (5000 UT) CAPS Take one capsule by mouth once daily.    escitalopram  (LEXAPRO ) 20 MG tablet Take 1 tablet (20 mg total) by mouth at bedtime.    glucose blood (ACCU-CHEK GUIDE) test strip USE  4 TIMES  DAILY    hydrOXYzine  (ATARAX ) 10 MG tablet Take 1 tablet (10 mg total) by mouth 3 (three) times daily as  needed for anxiety.    meclizine  (ANTIVERT ) 25 MG tablet Take 1 tablet (25 mg total) by mouth 3 (three) times daily as needed for dizziness.    melatonin 5 MG TABS Take 1 tablet (5 mg total) by mouth at bedtime.    traZODone  (DESYREL ) 150 MG tablet Take 1 tablet (150 mg total) by mouth at bedtime for insomnia.    Ubrogepant  (UBRELVY ) 100 MG TABS Take 1 tablet (100 mg total) by mouth daily as needed.    Accu-Chek Softclix Lancets lancets USE 1  TO CHECK GLUCOSE 4 TIMES DAILY (Patient not taking: Reported on 08/07/2024) 11/01/2023: Have to pay out of pocket fees    Accu-Chek Softclix Lancets lancets Use each in the morning, at noon, and at bedtime. (Patient not taking: Reported on 08/07/2024)    albuterol  (VENTOLIN  HFA) 108 (90 Base) MCG/ACT inhaler Inhale 2 puffs into the lungs every 6 (six) hours as needed for wheezing or shortness of breath.    dabigatran  (PRADAXA ) 150 MG CAPS capsule Take 1 capsule (150 mg total) by mouth 2 (two) times daily for anticoagulant.    levETIRAcetam  (KEPPRA ) 750 MG tablet Take 1 tablet (750 mg total) by mouth 2 (two) times daily.    oxyCODONE  (ROXICODONE ) 5 MG immediate release tablet Take 1 tablet (5 mg total) by mouth 3 (three) times daily as needed for severe pain (pain score 7-10). (Patient not taking: Reported on 08/07/2024)    pregabalin  (LYRICA ) 25 MG capsule Take 1 capsule (25 mg total) by mouth 3 (three) times daily. (Patient not taking: Reported on 08/07/2024)    tiZANidine  (ZANAFLEX ) 4 MG tablet Take 1 tablet (4 mg total) by mouth every 6 (six) hours as needed for muscle spasms. (Patient not taking: Reported on 08/07/2024)    [DISCONTINUED] carvedilol  (COREG ) 3.125 MG tablet Take 1 tablet (3.125 mg total) by mouth 2 (two) times daily with a meal. 07/01/2022: changed to Toprol  XL   No facility-administered encounter medications on file as of 08/07/2024.    PAST  MEDICAL HISTORY: Past Medical History:  Diagnosis Date   Abnormal Pap smear    Acute combined systolic and diastolic heart failure (HCC) 02/19/2019   Wt Readings from Last 3 Encounters:  09/18/21  84.7 kg  09/02/21  84.4 kg  08/31/21  84.2 kg         Acute heart failure (HCC) 01/29/2022   Acute on chronic combined systolic and diastolic CHF (congestive heart failure) (HCC) 02/19/2019   Acute on chronic congestive heart failure (HCC)    Acute on chronic congestive heart failure (HCC)    Acute on chronic diastolic CHF (congestive heart failure) (HCC) 10/19/2021   Acute on chronic systolic heart failure (HCC) 02/22/2022   Anemia    Asthma    Atrial fibrillation (HCC)    history of paroxysmal atrial fibrillation   CHF (congestive heart failure) (HCC) 11/02/2021   CHF exacerbation (HCC) 10/17/2021   Diabetes mellitus    DKA, type 2 (HCC) 02/06/2019   Elevated troponin 09/30/2019   Heart failure with reduced ejection fraction (HCC)    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  Consider chronic etiology given continued fertility issues   Hypertension    Hypertensive emergency 09/30/2019   Hypertensive urgency 06/02/2022   Major depressive disorder 02/08/2012   Reports that her infertility is a major cause of her depression Reports Prozac  has worked in the past for her.  Migraines    Morbid obesity (HCC) 02/06/2019   Nausea & vomiting 06/26/2022   Nonischemic cardiomyopathy (HCC) 09/22/2021   NSTEMI (non-ST elevated myocardial infarction) (HCC) 02/23/2022   Obstructive sleep apnea 06/30/2015   Polycystic ovarian disease     PAST SURGICAL HISTORY: Past Surgical History:  Procedure Laterality Date   BIOPSY  11/05/2021   Procedure: BIOPSY;  Surgeon: Stacia Glendia BRAVO, MD;  Location: [facility] ENDOSCOPY;  Service: Gastroenterology;;   CERVIX LESION DESTRUCTION     CESAREAN SECTION     ESOPHAGOGASTRODUODENOSCOPY (EGD) WITH  PROPOFOL  N/A 11/05/2021   Procedure: ESOPHAGOGASTRODUODENOSCOPY (EGD) WITH PROPOFOL ;  Surgeon: Stacia Glendia BRAVO, MD;  Location: [facility] ENDOSCOPY;  Service: Gastroenterology;  Laterality: N/A;   IR CT HEAD LTD  09/24/2022   IR PERCUTANEOUS ART THROMBECTOMY/INFUSION INTRACRANIAL INC DIAG ANGIO  09/24/2022   RADIOLOGY WITH ANESTHESIA N/A 09/24/2022   Procedure: IR WITH ANESTHESIA;  Surgeon: Radiologist, Medication, MD;  Location: [facility] OR;  Service: Radiology;  Laterality: N/A;   RIGHT/LEFT HEART CATH AND CORONARY ANGIOGRAPHY N/A 03/29/2019   Procedure: RIGHT/LEFT HEART CATH AND CORONARY ANGIOGRAPHY;  Surgeon: Rolan Ezra RAMAN, MD;  Location: [facility] INVASIVE CV LAB;  Service: Cardiovascular;  Laterality: N/A;   RIGHT/LEFT HEART CATH AND CORONARY ANGIOGRAPHY N/A 09/20/2021   Procedure: RIGHT/LEFT HEART CATH AND CORONARY ANGIOGRAPHY;  Surgeon: Rolan Ezra RAMAN, MD;  Location: [facility] INVASIVE CV LAB;  Service: Cardiovascular;  Laterality: N/A;    ALLERGIES: Allergies[1]  FAMILY HISTORY: Family History  Problem Relation Age of Onset   Diabetes Mother    Hypertension Mother    Hypertension Father    Diabetes Father     SOCIAL HISTORY: Social History[2] Social History   Social History Narrative   Are you right handed or left handed? right   Are you currently employed ?    What is your current occupation? disability   Do you live at home alone? yes   Who lives with you?    What type of home do you live in: 1 story or 2 story? one    Caffiene 24 oz a day      Objective:  Vital Signs:  BP (!) 146/93   Pulse 75   Ht 4' 11 (1.499 m)   Wt 136 lb (61.7 kg)   SpO2 100%   BMI 27.47 kg/m   General: No acute distress.  Patient appears well-groomed.   Head:  Normocephalic/atraumatic Eyes:  fundi examined, disc margins clear, no obvious papilledema Heart: regular rate and rhythm Lungs: Clear to auscultation bilaterally. Vascular: No carotid bruits.  Neurological Exam: Mental status: alert and oriented,  speech fluent and not dysarthric, language intact.  Cranial nerves: CN I: not tested CN II: pupils equal, round and reactive to light, visual fields intact CN III, IV, VI:  full range of motion, no nystagmus, no ptosis CN V: facial sensation intact. CN VII: upper and lower face symmetric CN VIII: hearing intact CN IX, X: uvula midline CN XI: sternocleidomastoid and trapezius muscles intact CN XII: tongue midline  Bulk & Tone: normal, no fasciculations. Motor:  muscle strength 5/5 In RUE and RLE. 0/5 in entire LUE, 4+/5 in proximal LLE, with 0/5 DF and 2/5 PF Deep Tendon Reflexes:  2+ in RUE and RLE; 3+ in LUE and LLE  Sensation:  Increased sensation in LUE to pinprick, otherwise sensation intact. Gait:  Steppage gait. Able to take a few steps with assistance.   Labs and Imaging review: New results: 07/05/24: HbA1c: 5.9  05/26/24: CMP  significant for glucose 113, tbili 1.4 CBC w/ diff significant for Hb 10.7, MCV 83.7  CT head wo contrast (05/26/24): IMPRESSION: 1. No acute intracranial abnormality. 2. 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  Previously reviewed results: 08/19/23: CBC unremarkable BMP significant for glucose 170   HbA1c (08/28/23): 5.7   Recent Labs           Lab Results  Component Value Date    VITAMINB12 1,470 (H) 06/29/2022      Recent Labs           Lab Results  Component Value Date    TSH 3.269 09/24/2022      04/04/23: CMP significant for Na 132, glucose 154, tbili 2.3, alk phos 151 CBC unremarkable   HbA1c (12/29/22): 7.8   Lipid panel: Component     Latest Ref Rng 09/24/2022  Cholesterol     0 - 200 mg/dL 863   Triglycerides     <150 mg/dL 62   HDL Cholesterol     >40 mg/dL 27 (L)   Total CHOL/HDL Ratio     RATIO 5.0   VLDL     0 - 40 mg/dL 12   LDL (calc)     0 - 99 mg/dL 97       Imaging: CT head wo contrast and CTA head and neck (09/24/22): FINDINGS: CT HEAD FINDINGS   Brain: There is  questionable loss of gray-white differentiation in the posterior insula suspicious for early infarct in the MCA distribution. The remainder of the MCA territory appears spared at this time. ASPECTS is 9.   There is no acute intracranial hemorrhage or extra-axial fluid collection.   Background parenchymal volume is normal. The ventricles are normal in size. Probable prominent perivascular spaces are noted in the bilateral basal ganglia.   The pituitary and suprasellar region are normal. There is no mass lesion. There is no mass effect or midline shift.   Vascular: See below.   Skull: Normal. Negative for fracture or focal lesion.   Sinuses/Orbits: There is mild mucosal thickening in the paranasal sinuses. There is a rightward gaze deviation.   Other: None.   Review of the MIP images confirms the above findings   CTA NECK FINDINGS   Aortic arch: The imaged aortic arch is normal. The origins of the major branch vessels are patent. The subclavian arteries are patent to the level imaged.   Right carotid system: The right common, internal, and external carotid arteries are patent with mild plaque in the common carotid artery and at the bifurcation but no hemodynamically significant stenosis or occlusion. There is no evidence of dissection or aneurysm.   Left carotid system: The left common, internal, and external carotid arteries are patent with mild plaque at the bifurcation but no hemodynamically significant stenosis or occlusion. There is no evidence of dissection or aneurysm   Vertebral arteries: The vertebral arteries are patent, without hemodynamically significant stenosis or occlusion. There is no evidence of dissection or aneurysm.   Skeleton: There is no acute osseous abnormality or suspicious osseous lesion. There is no visible canal hematoma.   Other neck: The soft tissues of the neck are unremarkable.   Upper chest: There is an incompletely imaged right  pleural effusion. The imaged lung apices are otherwise clear.   Review of the MIP images confirms the above findings   CTA HEAD FINDINGS   Anterior circulation: The intracranial ICAs are patent.   The right M1 segment is patent. Small patent  MCA branches are seen in the region of the frontal operculum; however, there is essentially no other collateral flow in the remainder of the MCA distribution.   The left M1 segment and distal branches are patent, without proximal stenosis or occlusion.   The left A1 segment is relatively hypoplastic, likely developmental. The dominant right A1 segment is patent. The distal ACA branches are patent, without proximal stenosis or occlusion.   There is no aneurysm or AVM.   Posterior circulation: The bilateral V4 segments are patent. The basilar artery is patent, diminutive in caliber. The bilateral PCAs are patent, supplied by posterior communicating arteries bilaterally (fetal origins).   There is no aneurysm or AVM.   Venous sinuses: Not well evaluated due to bolus timing.   Anatomic variants: As above.   Review of the MIP images confirms the above findings   IMPRESSION: 1. Loss of gray-white differentiation in the insula likely reflecting early infarct. Aspects is 9. 2. Proximal right M2 occlusion with minimal collateral flow throughout the MCA distribution. 3. Patent vasculature of the neck with no hemodynamically significant stenosis or occlusion.   MRI brain and MRA head (09/25/22): FINDINGS: MRI HEAD FINDINGS   Brain: There is large area of diffusion restriction with associated FLAIR signal abnormality in the right MCA distribution involving the external capsule, insular cortex, temporal lobe, frontal lobe, and parietal consistent with acute infarct. There is also a small area of infarct in the right superior frontal gyrus cortex near the vertex in the ACA distribution. Additional punctate infarcts are seen in the left  cerebellar hemisphere, right caudate body/corona radiata, and right frontal lobe white matter. There is no evidence of hemorrhagic transformation. There is swelling with sulcal effacement but no midline shift.   There are punctate chronic microhemorrhages in the right caudate body and right external capsule, nonspecific. The focus in the caudate body is unchanged since 2023.   Background parenchymal volume is normal. The ventricles are normal in size. There is no significant underlying chronic small-vessel ischemic change, there is a small remote infarct but in the right cerebral peduncle, unchanged since 2023.   The pituitary and suprasellar region are normal. There is no solid mass lesion.   Vascular: See below.   Skull and upper cervical spine: Normal marrow signal.   Sinuses/Orbits: The paranasal sinuses are clear. The globes and orbits are unremarkable.   Other: None.   MRA HEAD FINDINGS   Anterior circulation: The intracranial ICAs are patent.   The right M1 segment is normal. There is persistent occlusion of a dominant right M2 branch in the sylvian fissure (9-111). Patent MCA branches are seen in the frontal operculum region; however, there is otherwise minimal collateral flow in the remainder of the MCA distribution.   The left M1 segment and distal branches are patent, without proximal stenosis or occlusion.   The bilateral ACAs are patent, without proximal stenosis or occlusion.   There is no aneurysm or AVM.   Posterior circulation: The bilateral V4 segments are patent. The basilar artery is patent. The major cerebellar arteries appear patent.   The bilateral PCAs are patent, primarily supplied by prominent posterior communicating arteries bilaterally (fetal origins). There is no proximal stenosis or occlusion   There is no aneurysm or AVM.   Anatomic variants: As above.   IMPRESSION: 1. Large area of acute infarct in the right MCA  distribution, smaller area of acute infarct in the right ACA distribution, and punctate acute infarct in the left cerebellar hemisphere.  No evidence of hemorrhagic transformation or midline shift. 2. Persistent occlusion of a right M2 branch with minimal collateral flow in the MCA distribution aside from in the region of the frontal operculum.   IR thrombectomy (09/24/22): FINDINGS: The right common carotid arteriogram demonstrates the right external carotid artery and its major branches to be widely patent.   The right internal carotid artery at the bulb to the cranial skull base is widely patent with moderate tortuosity in the mid cervical right ICA.   The petrous, the cavernous and the supraclinoid right ICA demonstrate wide patency. A right posterior communicating artery is seen opacifying the right posterior cerebral artery distribution.   The right anterior cerebral artery opacifies into the capillary and venous phases.   The right middle cerebral artery M1 segment is widely patent as is the superior division. Occlusion is noted of the mid M2 segment of the inferior division.   IMPRESSION: Status post endovascular revascularization of occluded inferior division M2 segment of the right middle cerebral artery with 1 pass with a contact aspiration, 1 pass with contact aspiration and 3 mm x 20 mm Solitaire X retrieval device, 1 pass with contact aspiration and 1 pass with a 4 mm x 40 mm Solitaire X retrieval device, 1 pass with the 3 mm x 20 mm Solitaire X retrieval device with contact aspiration and finally with 1 pass with contact aspiration without significant change achieving a TICI 2B revascularization.   Echocardiogram (09/25/22): 1. 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   2. Left ventricular ejection fraction, by estimation, is 15%. The left  ventricle has severely decreased function. The left ventricle demonstrates  global hypokinesis. There is mild left  ventricular hypertrophy. Left  ventricular diastolic parameters are  consistent with Grade III diastolic dysfunction (restrictive).   3. Right ventricular systolic function is moderately reduced. The right  ventricular size is normal.   4. The mitral valve is grossly normal. Mild mitral valve regurgitation.  No evidence of mitral stenosis.   5. Tricuspid valve regurgitation is moderate.   6. The aortic valve is tricuspid. Aortic valve regurgitation is not  visualized. No aortic stenosis is present.   7. The inferior vena cava is normal in size with <50% respiratory  variability, suggesting right atrial pressure of 8 mmHg, patient currently  intubated on mechanical ventilation.  LA normal in size.   CT head wo contrast (07/21/23): FINDINGS: Brain: Expected evolution of the prior right MCA and ACA territory infarcts with developing encephalomalacia in these regions. No evidence of acute large vascular territory infarct, acute hemorrhage, mass lesion or midline shift. No hydrocephalus.   Vascular: No hyperdense vessel identified.   Skull: No acute fracture.   Sinuses/Orbits: Clear sinuses.  No acute orbital findings.   Other: No mastoid effusions.   IMPRESSION: 1. Expected evolution of the prior right MCA and ACA territory infarcts. 2. No evidence of new/interval acute abnormality.  CT head wo contrast (08/20/23): FINDINGS: Brain: Sequelae from old right MCA and ACA territory infarcts are again noted, unchanged from prior. No mass effect or midline shift. No acute infarcts are seen. No acute intracranial hemorrhage or hydrocephalus. No extra-axial fluid collection.   Vascular: No hyperdense vessel or unexpected calcification.   Skull: Normal. Negative for fracture or focal lesion.   Sinuses/Orbits: No acute finding.   Other: None.   IMPRESSION: 1. No acute intracranial process. 2. Sequelae from old right MCA and ACA territory infarcts, unchanged from  prior.  Assessment/Plan:  This is [name], a 49 y.o. 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 She has significant weakness and spasticity in LUE > LLE. Left foot drop contributing to poor ambulation. 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 No known seizures while on Keppra  Spasticity - complication of stroke. Patient is very spastic in LUE, less so in LLE. She is now getting botox  with Dr. Carilyn Migraines - respond to Ubrelvy . Reporting near daily headaches even with Qulipta . She thinks it is related to poor eating habits. She has contraindications to most preventatives. TCA or SSRI is contraindicated as she is already on Lexapro , propranolol as she is on metoprolol , topamax as she is on other diuretics. She prefers a pill to a shot, thus I ordered Qulipta . She is okay continuing this and trying to improve hydration and nutrition to see if this helps headaches.  Patient's primary concern today is being able to drive. I have serious concerns about this and expressed given her limited mobility that I do not think driving was a good idea. She really wants to be given a chance though. I will refer her for driver's evaluation, but warned her that she may not be cleared to drive.  Plan: -I don't think patient will be able to drive, but she is adamant about getting a chance. I will try to reorder the drivers evaluation -Continue Keppra  750 mg BID for seizures -For secondary stroke prevention: continue dabigatraban 150 mg BID and atorvastatin  80 mg daily -Continue with Dr. Carilyn for botox  for spasticity  -Recommend wearing AFO - has one but does not wear it  -Fall precautions discussed -Stroke precautions discussed  For migraines: Migraine prevention:  Agreed to continue Qulipta  30 mg daily. Migraine rescue:  Continue Ubrelvy  100 mg as needed at headache onset Limit use of pain relievers to no more than 2 days out of week to prevent risk of rebound or  medication-overuse headache. Keep headache diary Work on better hydration and nutrition  Return to clinic in 6 months  Total time spent reviewing records, interview, history/exam, documentation, and coordination of care on day of encounter:  40 min  Venetia Potters, MD     [1]  Allergies Allergen Reactions   Fish Allergy Other (See Comments) and Swelling    Can tolerate flounder, perch, and trout   Shellfish Allergy Anaphylaxis, Other (See Comments) and Swelling    Airway involvement including EMS - Has Epi-Pen  Other Reaction(s): Other (See Comments)   Metformin  Diarrhea   Metformin  And Related Diarrhea   Nsaids Other (See Comments)    Per PCP, interferes with daily meds (heart failure)  Other Reaction(s): Other (See Comments)   Trulicity  [Dulaglutide ] Nausea And Vomiting and Other (See Comments)    Multiple episodes of vomiting over multiple days  [2]  Social History Tobacco Use   Smoking status: Never   Smokeless tobacco: Never   Tobacco comments:    Never smoke 10/22/21  Vaping Use   Vaping status: Never Used  Substance Use Topics   Alcohol use: No    Alcohol/week: 0.0 standard drinks of alcohol   Drug use: No   "

## 2024-07-29 ENCOUNTER — Other Ambulatory Visit (HOSPITAL_COMMUNITY): Payer: Self-pay

## 2024-08-01 ENCOUNTER — Other Ambulatory Visit: Payer: Self-pay | Admitting: *Deleted

## 2024-08-01 NOTE — Patient Instructions (Signed)
 Visit Information  Thank you for taking time to visit with me today. Please don't hesitate to contact me if I can be of assistance to you before our next scheduled appointment.  Our next appointment is by telephone on 08/30/2024 at 1:00 PM with Colleen Brueck, RNCM Please call the care guide team at [phone] if you need to cancel or reschedule your appointment.   Following is a copy of your care plan:   Goals Addressed             This Visit's Progress    VBCI RN Care Plan-HTN       Problems:  Chronic Disease Management support and education needs related to HTN Knowledge Deficits related to HTN  Goal: Over the next 90 days the Patient will attend all scheduled medical appointments: as discussed today as evidenced by chart review and patient report        continue to work with RN Care Manager and/or Social Worker to address care management and care coordination needs related to HTN as evidenced by adherence to care management team scheduled appointments     demonstrate understanding of rationale for each prescribed medication as evidenced by chart review and patient report    take all medications exactly as prescribed and will call provider for medication related questions as evidenced by patient report and chart review     Interventions:   Hypertension Interventions: Last practice recorded BP readings:  BP Readings from Last 3 Encounters:  07/05/24 (!) 140/90  06/17/24 (!) 149/91  05/26/24 (!) 163/102   Most recent eGFR/CrCl:  Lab Results  Component Value Date   EGFR 52 (L) 11/11/2022    No components found for: CRCL  Evaluation of current treatment plan related to hypertension self management and patient's adherence to plan as established by provider Provided education to patient re: stroke prevention, s/s of heart attack and stroke Reviewed medications with patient and discussed importance of compliance Discussed plans with patient for ongoing care management follow  up and provided patient with direct contact information for care management team Discussed complications of poorly controlled blood pressure such as heart disease, stroke, circulatory complications, vision complications, kidney impairment, sexual dysfunction Screening for signs and symptoms of depression related to chronic disease state  Assessed social determinant of health barriers   Patient Self-Care Activities:  Attend all scheduled provider appointments Attend church or other social activities Call pharmacy for medication refills 3-7 days in advance of running out of medications Call provider office for new concerns or questions  Perform all self care activities independently  Perform IADL's (shopping, preparing meals, housekeeping, managing finances) independently Take medications as prescribed   choose a place to take my blood pressure (home, clinic or office, retail store) write blood pressure results in a log or diary Pt will continue to seek assistance from her PCS aid in obtaining her daily blood pressures due to her limited mobility with left side residual from her stroke history. learn about high blood pressure keep a blood pressure log take blood pressure log to all doctor appointments call doctor for signs and symptoms of high blood pressure keep all doctor appointments take medications for blood pressure exactly as prescribed report new symptoms to your doctor eat more whole grains, fruits and vegetables, lean meats and healthy fats limit salt intake   Plan:  Telephone follow up appointment with care management team member scheduled for:  08/30/2024 @ 1:00 pm with Elida Pulse, RNCM The patient has been provided with  contact information for the care management team and has been advised to call with any health related questions or concerns.   Olam Ku, RN, BSN [place]  [facility], [facility] RN Care Manager Direct Dial: [phone]   Fax: [phone]               Please call the Suicide and Crisis Lifeline: 988 call the [place]  National Suicide Prevention Lifeline: 864-232-0681 or TTY: 8437641722 TTY 952-590-2540) to talk to a trained counselor call 1-800-273-TALK (toll free, 24 hour hotline) if you are experiencing a Mental Health or Behavioral Health Crisis or need someone to talk to.  Patient verbalized understanding of Care plan and visit instructions communicated this visit   Olam Ku, RN, BSN [place]  [facility], [facility] RN Care Manager Direct Dial: [phone]  Fax: [phone]

## 2024-08-01 NOTE — Patient Outreach (Signed)
 Complex Care Management   Visit Note  08/01/2024  Name:  [name] MRN: [id number] DOB: January 15, 1976  Situation: Referral received for Complex Care Management related to HTN I obtained verbal consent from Patient.  Visit completed with Patient  on the phone  Background:   Past Medical History:  Diagnosis Date   Abnormal Pap smear    Acute combined systolic and diastolic heart failure (HCC) 02/19/2019   Wt Readings from Last 3 Encounters:  09/18/21  84.7 kg  09/02/21  84.4 kg  08/31/21  84.2 kg         Acute heart failure (HCC) 01/29/2022   Acute on chronic combined systolic and diastolic CHF (congestive heart failure) (HCC) 02/19/2019   Acute on chronic congestive heart failure (HCC)    Acute on chronic congestive heart failure (HCC)    Acute on chronic diastolic CHF (congestive heart failure) (HCC) 10/19/2021   Acute on chronic systolic heart failure (HCC) 02/22/2022   Anemia    Asthma    Atrial fibrillation (HCC)    history of paroxysmal atrial fibrillation   CHF (congestive heart failure) (HCC) 11/02/2021   CHF exacerbation (HCC) 10/17/2021   Diabetes mellitus    DKA, type 2 (HCC) 02/06/2019   Elevated troponin 09/30/2019   Heart failure with reduced ejection fraction (HCC)    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  Consider chronic etiology given continued fertility issues   Hypertension    Hypertensive emergency 09/30/2019   Hypertensive urgency 06/02/2022   Major depressive disorder 02/08/2012   Reports that her infertility is a major cause of her depression Reports Prozac  has worked in the past for her.     Migraines    Morbid obesity (HCC) 02/06/2019   Nausea & vomiting 06/26/2022   Nonischemic cardiomyopathy (HCC) 09/22/2021   NSTEMI (non-ST elevated myocardial infarction) (HCC) 02/23/2022   Obstructive sleep apnea 06/30/2015   Polycystic ovarian disease     Assessment: Pt very concerned about  her elevated blood pressures due to her past history of a stroke March 2024. Pt has a PCS aide who obtains her blood pressure daily due to her limited left side residual and limitations performing this task. Education provided verbally and will be added to her MyChart for self education and awareness on when to seek medical attention and contact her provider with any precipitating symptoms. Patient Reported Symptoms:  Cognitive Cognitive Status: Able to follow simple commands, Alert and oriented to person, place, and time, Normal speech and language skills   Health Maintenance Behaviors: Annual physical exam Healing Pattern: Slow Health Facilitated by: Rest, Pain control  Neurological Neurological Review of Symptoms: Headaches (HA and managed by her neurologist. Good recovery with the prescribed medication.) Neurological Management Strategies: Coping strategies Neurological Self-Management Outcome: 4 (good) Neurological Comment: Pt reports hx of ongoing HA  HEENT HEENT Symptoms Reported: No symptoms reported HEENT Management Strategies: Coping strategies, Routine screening HEENT Self-Management Outcome: 4 (good)    Cardiovascular Cardiovascular Symptoms Reported: Other: (Hx of a stroke 09/24/2022 with left side residual and she requires assist with taking her daily BP (receives assistence from her PCS worker-aid)) Does patient have uncontrolled Hypertension?: Yes Is patient checking Blood Pressure at home?: Yes Patient's Recent BP reading at home: Hx of a stroke 09/24/2022 with left side residual and she requires assist with taking her daily BP (receives assistance from her PCS worker-aid) Unable to resite recent BP and aid  services has not arrived yet for today. Cardiovascular Management Strategies: Coping strategies, Medication therapy, Routine screening Cardiovascular Self-Management Outcome: 4 (good) Cardiovascular Comment: Pt has a CAD provider for ongoing interventions related to her blood  pressure.  Respiratory Respiratory Symptoms Reported: Wheezing, Dry cough Other Respiratory Symptoms: Pt reports she continue to have flare up with her allegeries and uses ther inhlaer as prescribed. Additional Respiratory Details: Encouraged OTC losenzers-states she is taking Hall's cough gtts. Respiratory Management Strategies: Coping strategies, Routine screening Respiratory Self-Management Outcome: 4 (good)  Endocrine Endocrine Symptoms Reported: No symptoms reported Is patient diabetic?: Yes Is patient checking blood sugars at home?: No (Pt reports her glucose levels are controled and she does not check her levels) Endocrine Self-Management Outcome: 4 (good)  Gastrointestinal Gastrointestinal Symptoms Reported: Constipation (Pt reports she is not straining with her bowel movement. OTC) Additional Gastrointestinal Details: Reports weekly bowel movements and her provider is aware. Pt states she is taking what's recommended with some relief. Gastrointestinal Management Strategies: Coping strategies Gastrointestinal Self-Management Outcome: 4 (good)    Genitourinary Genitourinary Symptoms Reported: No symptoms reported Genitourinary Management Strategies: Medication therapy, Coping strategies Genitourinary Self-Management Outcome: 4 (good)  Integumentary Integumentary Symptoms Reported: No symptoms reported Skin Management Strategies: Routine screening Skin Self-Management Outcome: 4 (good)  Musculoskeletal Musculoskelatal Symptoms Reviewed: Back pain, Unsteady gait, Weakness Additional Musculoskeletal Details: Ongoing back/arm pain. Pt attends neuro rehab pending next follow up last this month. Musculoskeletal Management Strategies: Coping strategies, Routine screening Musculoskeletal Self-Management Outcome: 4 (good) Falls in the past year?: Yes Number of falls in past year: 1 or less Was there an injury with Fall?: No Fall Risk Category Calculator: 1 Patient Fall Risk Level: Low Fall  Risk Patient at Risk for Falls Due to: Impaired balance/gait, Impaired mobility Fall risk Follow up: Education provided  Psychosocial Psychosocial Symptoms Reported: Other ([facility] and ongoing medication for her depression continue to assist)     Quality of Family Relationships: helpful, involved, supportive Do you feel physically threatened by others?: No    08/01/2024    PHQ2-9 Depression Screening   Little interest or pleasure in doing things Several days  Feeling down, depressed, or hopeless Several days  PHQ-2 - Total Score 2  Trouble falling or staying asleep, or sleeping too much Several days  Feeling tired or having little energy Not at all  Poor appetite or overeating  Not at all  Feeling bad about yourself - or that you are a failure or have let yourself or your family down Several days  Trouble concentrating on things, such as reading the newspaper or watching television Several days  Moving or speaking so slowly that other people could have noticed.  Or the opposite - being so fidgety or restless that you have been moving around a lot more than usual Several days  Thoughts that you would be better off dead, or hurting yourself in some way Not at all  PHQ2-9 Total Score 6  If you checked off any problems, how difficult have these problems made it for you to do your work, take care of things at home, or get along with other people Somewhat difficult  Depression Interventions/Treatment Counseling, Medication (Currently with [facility] child psychotherapist. Also pending intervention for a psychiatrist)    There were no vitals filed for this visit. Pain Scale: 0-10 Pain Score: 7  Pain Type: Chronic pain Pain Location: Arm Pain Orientation: Left Pain Descriptors / Indicators: Aching Pain Onset: On-going Patients Stated Pain Goal: 4 Pain Intervention(s): Medication (See eMAR),  Repositioned, Rest, Massage, Elevated extremity (Pt states she has reported her pain levels and issues to her  provider)  Medications Reviewed Today     Reviewed by Alvia Olam BIRCH, RN (Registered Nurse) on 08/01/24 at 1320  Med List Status: <None>   Medication Order Taking? Sig Documenting Provider Last Dose Status Informant  Accu-Chek Softclix Lancets lancets [id number]  USE 1  TO CHECK GLUCOSE 4 TIMES DAILY  Patient not taking: Reported on 06/17/2024   Jeanelle Layman CROME, MD  Active Father           Med Note ANGELICA, JASMINE D   Wed Nov 01, 2023  1:20 PM) Have to pay out of pocket fees   Accu-Chek Softclix Lancets lancets [id number]  Use each in the morning, at noon, and at bedtime.  Patient not taking: Reported on 06/17/2024   Lorilee Sven SQUIBB, MD  Active   acetaminophen  (TYLENOL ) 500 MG tablet [id number] Yes Take 1-2 tablets (500-1,000 mg total) by mouth every 6 (six) hours as needed for mild pain (pain score 1-3) or headache (OR CRAMPING). Leigh Venetia CROME, MD  Active   albuterol  (VENTOLIN  HFA) 108 (90 Base) MCG/ACT inhaler [id number] Yes Inhale 2 puffs into the lungs every 6 (six) hours as needed for wheezing or shortness of breath. Christia Budds, MD  Active   amLODipine -olmesartan  (AZOR ) 5-40 MG tablet [id number] Yes Take 1 tablet by mouth daily. Everhart, Kirstie, DO  Active   Atogepant  (QULIPTA ) 30 MG TABS [id number] Yes Take 1 tablet (30 mg total) by mouth daily. Leigh Venetia CROME, MD  Active   atorvastatin  (LIPITOR ) 80 MG tablet [id number] Yes Take 1 tablet (80 mg total) by mouth daily after supper. Everhart, Kirstie, DO  Active   blood glucose meter kit and supplies [id number] Yes Use up to 4 (four) times daily as directed Raulkar, Sven SQUIBB, MD  Active   Blood Glucose Monitoring Suppl (ACCU-CHEK GUIDE) w/Device KIT [id number] Yes Use up to 4 (four) times daily. Lorilee Sven SQUIBB, MD  Active     Discontinued 07/01/22 [phone]          Med Note>> Margrette Levorn POUR, [facility]   07/01/2022  9:52 AM changed to Toprol  XL    Cholecalciferol  (VITAMIN D ) 125 MCG (5000 UT) CAPS [id number] Yes Take one capsule by  mouth once daily. McDiarmid, Krystal BIRCH, MD  Active   dabigatran  (PRADAXA ) 150 MG CAPS capsule [id number] Yes Take 1 capsule (150 mg total) by mouth 2 (two) times daily for anticoagulant. Everhart, Kirstie, DO  Active   escitalopram  (LEXAPRO ) 20 MG tablet [id number] Yes Take 1 tablet (20 mg total) by mouth at bedtime. Everhart, Kirstie, DO  Active   glucose blood (ACCU-CHEK GUIDE) test strip [id number] Yes USE  4 TIMES DAILY Ganta, Anupa, DO  Active Father  hydrOXYzine  (ATARAX ) 10 MG tablet [id number] Yes Take 1 tablet (10 mg total) by mouth 3 (three) times daily as needed for anxiety. Lennie Raguel MATSU, DO  Active   levETIRAcetam  (KEPPRA ) 750 MG tablet [id number] Yes Take 1 tablet (750 mg total) by mouth 2 (two) times daily. Everhart, Kirstie, DO  Active   meclizine  (ANTIVERT ) 25 MG tablet [id number] Yes Take 1 tablet (25 mg total) by mouth 3 (three) times daily as needed for dizziness. Odell Balls, PA-C  Active   melatonin 5 MG TABS [id number] Yes Take 1 tablet (5 mg total) by mouth at bedtime. Everhart, Kirstie, DO  Active   oxyCODONE  (ROXICODONE ) 5 MG immediate release tablet [id number] Yes Take 1  tablet (5 mg total) by mouth 3 (three) times daily as needed for severe pain (pain score 7-10). Lorilee Sven SQUIBB, MD  Active   pregabalin  (LYRICA ) 25 MG capsule [id number] Yes Take 1 capsule (25 mg total) by mouth 3 (three) times daily. Lorilee Sven SQUIBB, MD  Active   tiZANidine  (ZANAFLEX ) 4 MG tablet [id number] Yes Take 1 tablet (4 mg total) by mouth every 6 (six) hours as needed for muscle spasms. Lennie Raguel MATSU, DO  Active   traZODone  (DESYREL ) 150 MG tablet [id number] Yes Take 1 tablet (150 mg total) by mouth at bedtime for insomnia. Everhart, Kirstie, DO  Active   Ubrogepant  (UBRELVY ) 100 MG TABS [id number] Yes Take 1 tablet (100 mg total) by mouth daily as needed. Leigh Venetia CROME, MD  Active             Recommendation:   PCP Follow-up Continue Current Plan of Care  Follow Up Plan:   Telephone follow  up appointment date/time:  08/30/2024 @ 1:00 pm with Elida Pulse, RNCM   Olam Ku, RN, BSN [facility] RN Care Manager Direct Dial: [phone]  Fax: [phone]

## 2024-08-07 ENCOUNTER — Encounter: Payer: Self-pay | Admitting: Neurology

## 2024-08-07 ENCOUNTER — Other Ambulatory Visit: Payer: Self-pay

## 2024-08-07 ENCOUNTER — Ambulatory Visit (INDEPENDENT_AMBULATORY_CARE_PROVIDER_SITE_OTHER): Admitting: Neurology

## 2024-08-07 ENCOUNTER — Other Ambulatory Visit (HOSPITAL_COMMUNITY): Payer: Self-pay

## 2024-08-07 VITALS — BP 146/93 | HR 75 | Ht 59.0 in | Wt 136.0 lb

## 2024-08-07 DIAGNOSIS — M21372 Foot drop, left foot: Secondary | ICD-10-CM

## 2024-08-07 DIAGNOSIS — I63511 Cerebral infarction due to unspecified occlusion or stenosis of right middle cerebral artery: Secondary | ICD-10-CM | POA: Diagnosis not present

## 2024-08-07 DIAGNOSIS — G4733 Obstructive sleep apnea (adult) (pediatric): Secondary | ICD-10-CM | POA: Diagnosis not present

## 2024-08-07 DIAGNOSIS — R2689 Other abnormalities of gait and mobility: Secondary | ICD-10-CM | POA: Diagnosis not present

## 2024-08-07 DIAGNOSIS — R252 Cramp and spasm: Secondary | ICD-10-CM | POA: Diagnosis not present

## 2024-08-07 DIAGNOSIS — G43009 Migraine without aura, not intractable, without status migrainosus: Secondary | ICD-10-CM | POA: Diagnosis not present

## 2024-08-07 DIAGNOSIS — I69398 Other sequelae of cerebral infarction: Secondary | ICD-10-CM

## 2024-08-07 DIAGNOSIS — R569 Unspecified convulsions: Secondary | ICD-10-CM

## 2024-08-07 MED ORDER — LEVETIRACETAM 750 MG PO TABS
750.0000 mg | ORAL_TABLET | Freq: Two times a day (BID) | ORAL | 11 refills | Status: AC
  Filled 2024-08-07: qty 60, 30d supply, fill #0

## 2024-08-07 MED ORDER — QULIPTA 30 MG PO TABS
30.0000 mg | ORAL_TABLET | Freq: Every day | ORAL | 11 refills | Status: AC
  Filled 2024-08-07: qty 30, 30d supply, fill #0

## 2024-08-07 NOTE — Patient Instructions (Addendum)
 -Continue Keppra  750 mg twice daily for seizures -For secondary stroke prevention: continue dabigatraban 150 mg twice daily and atorvastatin  80 mg daily -Continue with Dr. Carilyn for botox  for spasticity  -Recommend wearing AFO - has one but does not wear it   If you have new difficulty speaking, face droop, numbness on one side of the body, weakness on one side of the body, or dizziness/imbalance, this could be the sign of a stroke. Don't wait, please call EMS and be evaluated at the nearest emergency room.  For migraines: Migraine prevention:  Agreed to continue Qulipta  30 mg daily. Migraine rescue:  Continue Ubrelvy  100 mg as needed at headache onset Limit use of pain relievers to no more than 2 days out of week to prevent risk of rebound or medication-overuse headache. Keep headache diary Work on better hydration and nutrition  Return to clinic in 6 months  The physicians and staff at [facility] are committed to providing excellent care. You may receive a survey requesting feedback about your experience at our office. We strive to receive very good responses to the survey questions. If you feel that your experience would prevent you from giving the office a very good  response, please contact our office to try to remedy the situation. We may be reached at [phone]. Thank you for taking the time out of your busy day to complete the survey.  Venetia Potters, MD [facility]  Preventing Falls at [facility] are common, often dreaded events in the lives of older people. Aside from the obvious injuries and even death that may result, fall can cause wide-ranging consequences including loss of independence, mental decline, decreased activity and mobility. Younger people are also at risk of falling, especially those with chronic illnesses and fatigue.  Ways to reduce risk for falling Examine diet and medications. Warm foods and alcohol dilate blood vessels, which can lead to  dizziness when standing. Sleep aids, antidepressants and pain medications can also increase the likelihood of a fall.  Get a vision exam. Poor vision, cataracts and glaucoma increase the chances of falling.  Check foot gear. Shoes should fit snugly and have a sturdy, nonskid sole and a broad, low heel  Participate in a physician-approved exercise program to build and maintain muscle strength and improve balance and coordination. Programs that use ankle weights or stretch bands are excellent for muscle-strengthening. Water aerobics programs and low-impact Tai Chi programs have also been shown to improve balance and coordination.  Increase vitamin D  intake. Vitamin D  improves muscle strength and increases the amount of calcium  the body is able to absorb and deposit in bones.  How to prevent falls from common hazards Floors - Remove all loose wires, cords, and throw rugs. Minimize clutter. Make sure rugs are anchored and smooth. Keep furniture in its usual place.  Chairs -- Use chairs with straight backs, armrests and firm seats. Add firm cushions to existing pieces to add height.  Bathroom - Install grab bars and non-skid tape in the tub or shower. Use a bathtub transfer bench or a shower chair with a back support Use an elevated toilet seat and/or safety rails to assist standing from a low surface. Do not use towel racks or bathroom tissue holders to help you stand.  Lighting - Make sure halls, stairways, and entrances are well-lit. Install a night light in your bathroom or hallway. Make sure there is a light switch at the top and bottom of the staircase. Turn lights on if  you get up in the middle of the night. Make sure lamps or light switches are within reach of the bed if you have to get up during the night.  Kitchen - Install non-skid rubber mats near the sink and stove. Clean spills immediately. Store frequently used utensils, pots, pans between waist and eye level. This helps prevent reaching  and bending. Sit when getting things out of lower cupboards.  Living room/ Bedrooms - Place furniture with wide spaces in between, giving enough room to move around. Establish a route through the living room that gives you something to hold onto as you walk.  Stairs - Make sure treads, rails, and rugs are secure. Install a rail on both sides of the stairs. If stairs are a threat, it might be helpful to arrange most of your activities on the lower level to reduce the number of times you must climb the stairs.  Entrances and doorways - Install metal handles on the walls adjacent to the doorknobs of all doors to make it more secure as you travel through the doorway.  Tips for maintaining balance Keep at least one hand free at all times. Try using a backpack or fanny pack to hold things rather than carrying them in your hands. Never carry objects in both hands when walking as this interferes with keeping your balance.  Attempt to swing both arms from front to back while walking. This might require a conscious effort if Parkinson's disease has diminished your movement. It will, however, help you to maintain balance and posture, and reduce fatigue.  Consciously lift your feet off of the ground when walking. Shuffling and dragging of the feet is a common culprit in losing your balance.  When trying to navigate turns, use a U technique of facing forward and making a wide turn, rather than pivoting sharply.  Try to stand with your feet shoulder-length apart. When your feet are close together for any length of time, you increase your risk of losing your balance and falling.  Do one thing at a time. Don't try to walk and accomplish another task, such as reading or looking around. The decrease in your automatic reflexes complicates motor function, so the less distraction, the better.  Do not wear rubber or gripping soled shoes, they might catch on the floor and cause tripping.  Move slowly when  changing positions. Use deliberate, concentrated movements and, if needed, use a grab bar or walking aid. Count 15 seconds between each movement. For example, when rising from a seated position, wait 15 seconds after standing to begin walking.  If balance is a continuous problem, you might want to consider a walking aid such as a cane, walking stick, or walker. Once you've mastered walking with help, you might be ready to try it on your own again.

## 2024-08-08 ENCOUNTER — Telehealth: Payer: Self-pay

## 2024-08-08 ENCOUNTER — Other Ambulatory Visit: Payer: Self-pay | Admitting: Family Medicine

## 2024-08-08 ENCOUNTER — Other Ambulatory Visit: Payer: Self-pay

## 2024-08-08 ENCOUNTER — Other Ambulatory Visit: Payer: Self-pay | Admitting: Neurology

## 2024-08-08 ENCOUNTER — Other Ambulatory Visit (HOSPITAL_COMMUNITY): Payer: Self-pay

## 2024-08-08 DIAGNOSIS — M79605 Pain in left leg: Secondary | ICD-10-CM

## 2024-08-08 DIAGNOSIS — G43009 Migraine without aura, not intractable, without status migrainosus: Secondary | ICD-10-CM

## 2024-08-08 MED ORDER — TIZANIDINE HCL 4 MG PO TABS
4.0000 mg | ORAL_TABLET | Freq: Four times a day (QID) | ORAL | 0 refills | Status: AC | PRN
  Filled 2024-08-08: qty 30, 8d supply, fill #0

## 2024-08-08 MED ORDER — DABIGATRAN ETEXILATE MESYLATE 150 MG PO CAPS
150.0000 mg | ORAL_CAPSULE | Freq: Two times a day (BID) | ORAL | 3 refills | Status: AC
  Filled 2024-08-08: qty 60, 30d supply, fill #0

## 2024-08-08 NOTE — Telephone Encounter (Signed)
 Called pt and reported that I tried to call the Drivers Eval to see if they take Medicare and if not about how much would it cost. could not get in touch with them ( I was put on held ) I offered to give the number to her and she did not answer me. I told her that I needed a copy of her Medicare card to send it with the papers that I will fax to the OT Eval . She understood and said she will try and get the copy and My Chart it to Us  . I told her to call us  when she receives it so I can get it faxed. She understood.

## 2024-08-08 NOTE — Addendum Note (Signed)
 Addended by: TERRIL CHARLIES MATSU on: 08/08/2024 09:28 AM   Modules accepted: Orders

## 2024-08-09 ENCOUNTER — Telehealth: Payer: Self-pay

## 2024-08-09 ENCOUNTER — Other Ambulatory Visit (HOSPITAL_COMMUNITY): Payer: Self-pay

## 2024-08-09 ENCOUNTER — Telehealth: Payer: Self-pay | Admitting: *Deleted

## 2024-08-09 NOTE — Telephone Encounter (Signed)
 Nicole callled to report BP elevation asymptomatic : 173/110. I called back and asked that they notify PCP with BP issues. She has not been seen by Raulkar since 04/15/24.

## 2024-08-09 NOTE — Telephone Encounter (Signed)
 Nat PT with [facility] calls nurse line to report abnormal blood pressure.   She reports BP prior to treatment was 173/110.   She reports the patient has been compliant with her medications and reports taking her BP med(s) ~ 3 hours prior.   She reports the patient denies any high alert symptoms. She reports she discussed emergent care for headaches, vision changes, dizziness or chest pains.   Patient has an apt with PCP on 1/19.  Called patient and discussed, apt confirmed for Monday.

## 2024-08-09 NOTE — Patient Instructions (Incomplete)
 SABRA

## 2024-08-12 ENCOUNTER — Ambulatory Visit (INDEPENDENT_AMBULATORY_CARE_PROVIDER_SITE_OTHER): Payer: Self-pay | Admitting: Family Medicine

## 2024-08-12 ENCOUNTER — Encounter: Payer: Self-pay | Admitting: Family Medicine

## 2024-08-12 ENCOUNTER — Other Ambulatory Visit (HOSPITAL_COMMUNITY): Payer: Self-pay

## 2024-08-12 VITALS — BP 138/72 | HR 70 | Wt 139.6 lb

## 2024-08-12 DIAGNOSIS — I152 Hypertension secondary to endocrine disorders: Secondary | ICD-10-CM | POA: Diagnosis not present

## 2024-08-12 DIAGNOSIS — G894 Chronic pain syndrome: Secondary | ICD-10-CM

## 2024-08-12 DIAGNOSIS — G8194 Hemiplegia, unspecified affecting left nondominant side: Secondary | ICD-10-CM

## 2024-08-12 DIAGNOSIS — E1159 Type 2 diabetes mellitus with other circulatory complications: Secondary | ICD-10-CM | POA: Diagnosis present

## 2024-08-12 MED ORDER — AMLODIPINE BESYLATE 10 MG PO TABS
10.0000 mg | ORAL_TABLET | Freq: Every day | ORAL | 3 refills | Status: AC
  Filled 2024-08-12: qty 90, 90d supply, fill #0

## 2024-08-12 MED ORDER — AMLODIPINE-OLMESARTAN 10-40 MG PO TABS
1.0000 | ORAL_TABLET | Freq: Every day | ORAL | 3 refills | Status: DC
  Filled 2024-08-12: qty 30, 30d supply, fill #0

## 2024-08-12 MED ORDER — OLMESARTAN MEDOXOMIL 40 MG PO TABS
40.0000 mg | ORAL_TABLET | Freq: Every day | ORAL | 3 refills | Status: AC
  Filled 2024-08-12: qty 90, 90d supply, fill #0

## 2024-08-12 MED ORDER — PREGABALIN 25 MG PO CAPS
25.0000 mg | ORAL_CAPSULE | Freq: Every day | ORAL | 1 refills | Status: AC
  Filled 2024-08-12: qty 30, 30d supply, fill #0

## 2024-08-12 NOTE — Assessment & Plan Note (Signed)
 BP controlled today on Amlodipine  10mg  Olmesartan  40mg  Sent refills in as separate tablets for insurance reasons Check bmp today

## 2024-08-12 NOTE — Assessment & Plan Note (Signed)
 Restarted Pregabalin  at 25mg  daily for neuropathic pain

## 2024-08-12 NOTE — Progress Notes (Signed)
" ° ° °  SUBJECTIVE:   CHIEF COMPLAINT / HPI:   Presenting for BP f/u Has been elevated at home when PT checks it Neurologist increased her amlodipine  to 10mg  from 5mg  3 days ago  Pins and needles sensation LUE and LLE since stroke, worse recently Has used pregabalin  before with improvement. Does not tolerate gabapentin   PERTINENT  PMH / PSH: paroxysmal afib, CVA, NICM, HTN, OSA, HLD, T2DM  OBJECTIVE:   BP 138/72 Comment: manual  Pulse 70   Wt 139 lb 9.6 oz (63.3 kg)   SpO2 100%   BMI 28.20 kg/m   General: well appearing, NAD. In wheelchair Cardiovascular: RRR, no m/r/g Respiratory: normal work of breathing on RA, CTAB Extremities: residual LUE and LLE weakness, at baseline   ASSESSMENT/PLAN:   Assessment & Plan Hypertension associated with diabetes (HCC) BP controlled today on Amlodipine  10mg  Olmesartan  40mg  Sent refills in as separate tablets for insurance reasons Check bmp today Left hemiplegia (HCC) Chronic pain syndrome Restarted Pregabalin  at 25mg  daily for neuropathic pain     Elyce Prescott, DO [facility] "

## 2024-08-13 ENCOUNTER — Ambulatory Visit: Payer: Self-pay | Admitting: Family Medicine

## 2024-08-13 LAB — BASIC METABOLIC PANEL WITH GFR
BUN/Creatinine Ratio: 14 (ref 9–23)
BUN: 9 mg/dL (ref 6–24)
CO2: 25 mmol/L (ref 20–29)
Calcium: 9.2 mg/dL (ref 8.7–10.2)
Chloride: 98 mmol/L (ref 96–106)
Creatinine, Ser: 0.66 mg/dL (ref 0.57–1.00)
Glucose: 118 mg/dL — ABNORMAL HIGH (ref 70–99)
Potassium: 4.1 mmol/L (ref 3.5–5.2)
Sodium: 135 mmol/L (ref 134–144)
eGFR: 109 mL/min/1.73

## 2024-08-22 ENCOUNTER — Other Ambulatory Visit (HOSPITAL_COMMUNITY): Payer: Self-pay

## 2024-08-22 ENCOUNTER — Telehealth: Payer: Self-pay

## 2024-08-22 NOTE — Telephone Encounter (Signed)
 Patient [name] on nurse line returning call.   Returned call to patient.   Increased urinary frequency. Not making it to the bathroom. She was seen for urinary incontinence in July of 2025. She also reports that she never received supplies from DME company and she has been having to purchase at the store.   Concerns for overactive bladder. No infectious symptoms. Denies dysuria, fever, chills, back, abdominal or pelvic pain.   Advised that she may need to be seen to further discuss treatment for overactive bladder. Patient states that she is unable to come out in weather right now and would like me to send message to Dr. Stoney.   Chiquita JAYSON English, RN

## 2024-08-22 NOTE — Telephone Encounter (Signed)
 Patient LVM on nurse line regarding concerns with urination. Feeling loss of control  Attempted to call patient back to gather more information.   She did not answer. LVM requesting her to return call.   Chiquita JAYSON English, RN

## 2024-08-23 NOTE — Telephone Encounter (Signed)
 Spoke with patient. Made virtual for 2/10 . [name], CMA

## 2024-08-26 ENCOUNTER — Telehealth: Admitting: Licensed Clinical Social Worker

## 2024-08-28 ENCOUNTER — Encounter: Payer: Self-pay | Admitting: Gastroenterology

## 2024-08-30 ENCOUNTER — Telehealth

## 2024-09-03 ENCOUNTER — Telehealth: Payer: Self-pay | Admitting: Family Medicine

## 2024-09-05 ENCOUNTER — Encounter: Admitting: Physical Medicine and Rehabilitation

## 2024-09-06 ENCOUNTER — Ambulatory Visit: Payer: Self-pay | Admitting: Family Medicine

## 2024-09-13 ENCOUNTER — Telehealth: Payer: Self-pay

## 2024-09-18 ENCOUNTER — Telehealth: Payer: Self-pay | Admitting: Licensed Clinical Social Worker

## 2024-09-26 ENCOUNTER — Ambulatory Visit (HOSPITAL_BASED_OUTPATIENT_CLINIC_OR_DEPARTMENT_OTHER): Admitting: Pulmonary Disease

## 2025-02-26 ENCOUNTER — Ambulatory Visit: Payer: Self-pay | Admitting: Neurology
